# Patient Record
Sex: Male | Born: 1940
Health system: Southern US, Community
[De-identification: ages and names within clinical notes are randomized; demographics above are authoritative.]

## PROBLEM LIST (undated history)

## (undated) DIAGNOSIS — R7303 Prediabetes: Secondary | ICD-10-CM

## (undated) DIAGNOSIS — R001 Bradycardia, unspecified: Secondary | ICD-10-CM

## (undated) DIAGNOSIS — K219 Gastro-esophageal reflux disease without esophagitis: Secondary | ICD-10-CM

## (undated) DIAGNOSIS — I639 Cerebral infarction, unspecified: Secondary | ICD-10-CM

## (undated) DIAGNOSIS — M1710 Unilateral primary osteoarthritis, unspecified knee: Secondary | ICD-10-CM

## (undated) DIAGNOSIS — C439 Malignant melanoma of skin, unspecified: Secondary | ICD-10-CM

## (undated) DIAGNOSIS — Z8551 Personal history of malignant neoplasm of bladder: Secondary | ICD-10-CM

## (undated) DIAGNOSIS — I219 Acute myocardial infarction, unspecified: Secondary | ICD-10-CM

## (undated) DIAGNOSIS — Z950 Presence of cardiac pacemaker: Secondary | ICD-10-CM

## (undated) DIAGNOSIS — G44009 Cluster headache syndrome, unspecified, not intractable: Secondary | ICD-10-CM

## (undated) DIAGNOSIS — K2270 Barrett's esophagus without dysplasia: Secondary | ICD-10-CM

## (undated) DIAGNOSIS — M109 Gout, unspecified: Secondary | ICD-10-CM

## (undated) DIAGNOSIS — I10 Essential (primary) hypertension: Secondary | ICD-10-CM

## (undated) DIAGNOSIS — I2510 Atherosclerotic heart disease of native coronary artery without angina pectoris: Secondary | ICD-10-CM

## (undated) DIAGNOSIS — S2239XA Fracture of one rib, unspecified side, initial encounter for closed fracture: Secondary | ICD-10-CM

## (undated) DIAGNOSIS — G4730 Sleep apnea, unspecified: Secondary | ICD-10-CM

## (undated) DIAGNOSIS — L409 Psoriasis, unspecified: Secondary | ICD-10-CM

## (undated) DIAGNOSIS — E785 Hyperlipidemia, unspecified: Secondary | ICD-10-CM

## (undated) DIAGNOSIS — S02109A Fracture of base of skull, unspecified side, initial encounter for closed fracture: Secondary | ICD-10-CM

## (undated) DIAGNOSIS — I872 Venous insufficiency (chronic) (peripheral): Secondary | ICD-10-CM

## (undated) DIAGNOSIS — R609 Edema, unspecified: Secondary | ICD-10-CM

## (undated) DIAGNOSIS — I714 Abdominal aortic aneurysm, without rupture: Secondary | ICD-10-CM

## (undated) DIAGNOSIS — I509 Heart failure, unspecified: Secondary | ICD-10-CM

## (undated) DIAGNOSIS — Z95810 Presence of automatic (implantable) cardiac defibrillator: Secondary | ICD-10-CM

## (undated) DIAGNOSIS — C679 Malignant neoplasm of bladder, unspecified: Secondary | ICD-10-CM

## (undated) DIAGNOSIS — M179 Osteoarthritis of knee, unspecified: Secondary | ICD-10-CM

## (undated) DIAGNOSIS — I499 Cardiac arrhythmia, unspecified: Secondary | ICD-10-CM

## (undated) DIAGNOSIS — E119 Type 2 diabetes mellitus without complications: Secondary | ICD-10-CM

## (undated) DIAGNOSIS — J189 Pneumonia, unspecified organism: Secondary | ICD-10-CM

## (undated) DIAGNOSIS — M5136 Other intervertebral disc degeneration, lumbar region: Secondary | ICD-10-CM

## (undated) DIAGNOSIS — M51369 Other intervertebral disc degeneration, lumbar region without mention of lumbar back pain or lower extremity pain: Secondary | ICD-10-CM

## (undated) DIAGNOSIS — R0600 Dyspnea, unspecified: Secondary | ICD-10-CM

## (undated) HISTORY — DX: Barrett's esophagus without dysplasia: K22.70

## (undated) HISTORY — DX: Personal history of malignant neoplasm of bladder: Z85.51

## (undated) HISTORY — DX: Fracture of one rib, unspecified side, initial encounter for closed fracture: S22.39XA

## (undated) HISTORY — DX: Other intervertebral disc degeneration, lumbar region without mention of lumbar back pain or lower extremity pain: M51.369

## (undated) HISTORY — DX: Fracture of base of skull, unspecified side, initial encounter for closed fracture: S02.109A

## (undated) HISTORY — DX: Psoriasis, unspecified: L40.9

## (undated) HISTORY — DX: Cerebral infarction, unspecified: I63.9

## (undated) HISTORY — DX: Cardiac arrhythmia, unspecified: I49.9

## (undated) HISTORY — DX: Heart failure, unspecified: I50.9

## (undated) HISTORY — DX: Cluster headache syndrome, unspecified, not intractable: G44.009

## (undated) HISTORY — DX: Other intervertebral disc degeneration, lumbar region: M51.36

## (undated) HISTORY — DX: Unilateral primary osteoarthritis, unspecified knee: M17.10

## (undated) HISTORY — DX: Venous insufficiency (chronic) (peripheral): I87.2

## (undated) HISTORY — DX: Gout, unspecified: M10.9

## (undated) HISTORY — DX: Malignant melanoma of skin, unspecified: C43.9

## (undated) HISTORY — DX: Bradycardia, unspecified: R00.1

## (undated) HISTORY — DX: Osteoarthritis of knee, unspecified: M17.9

## (undated) HISTORY — DX: Type 2 diabetes mellitus without complications: E11.9

## (undated) HISTORY — PX: ELBOW BURSA SURGERY: SHX615

## (undated) HISTORY — DX: Atherosclerotic heart disease of native coronary artery without angina pectoris: I25.10

## (undated) HISTORY — DX: Presence of cardiac pacemaker: Z95.0

## (undated) HISTORY — PX: CORONARY ANGIOPLASTY: SHX604

## (undated) HISTORY — DX: Abdominal aortic aneurysm, without rupture: I71.4

## (undated) HISTORY — PX: INSERT / REPLACE / REMOVE PACEMAKER: SUR710

## (undated) HISTORY — DX: Hyperlipidemia, unspecified: E78.5

## (undated) HISTORY — DX: Prediabetes: R73.03

## (undated) HISTORY — DX: Sleep apnea, unspecified: G47.30

---

## 1992-11-25 HISTORY — PX: ANGIOPLASTY: SHX39

## 1995-11-26 DIAGNOSIS — S2239XA Fracture of one rib, unspecified side, initial encounter for closed fracture: Secondary | ICD-10-CM

## 1995-11-26 DIAGNOSIS — S02109A Fracture of base of skull, unspecified side, initial encounter for closed fracture: Secondary | ICD-10-CM

## 1995-11-26 HISTORY — DX: Fracture of base of skull, unspecified side, initial encounter for closed fracture: S02.109A

## 1995-11-26 HISTORY — DX: Fracture of one rib, unspecified side, initial encounter for closed fracture: S22.39XA

## 1995-12-27 DIAGNOSIS — Z8551 Personal history of malignant neoplasm of bladder: Secondary | ICD-10-CM

## 1995-12-27 HISTORY — PX: BLADDER TUMOR EXCISION: SHX238

## 1995-12-27 HISTORY — DX: Personal history of malignant neoplasm of bladder: Z85.51

## 2004-09-20 ENCOUNTER — Emergency Department: Payer: Self-pay | Admitting: Unknown Physician Specialty

## 2004-09-29 ENCOUNTER — Emergency Department: Payer: Self-pay | Admitting: Emergency Medicine

## 2004-11-05 ENCOUNTER — Ambulatory Visit: Payer: Self-pay | Admitting: Family Medicine

## 2005-10-01 ENCOUNTER — Ambulatory Visit: Payer: Self-pay | Admitting: Family Medicine

## 2005-10-02 ENCOUNTER — Ambulatory Visit: Payer: Self-pay | Admitting: Family Medicine

## 2006-09-22 HISTORY — PX: CORONARY ARTERY BYPASS GRAFT: SHX141

## 2006-10-10 DIAGNOSIS — Z950 Presence of cardiac pacemaker: Secondary | ICD-10-CM

## 2006-10-10 HISTORY — DX: Presence of cardiac pacemaker: Z95.0

## 2007-03-10 ENCOUNTER — Ambulatory Visit: Payer: Self-pay | Admitting: Family Medicine

## 2007-05-04 ENCOUNTER — Inpatient Hospital Stay: Payer: Self-pay | Admitting: *Deleted

## 2007-05-04 ENCOUNTER — Other Ambulatory Visit: Payer: Self-pay

## 2007-06-03 DIAGNOSIS — I714 Abdominal aortic aneurysm, without rupture, unspecified: Secondary | ICD-10-CM

## 2007-06-03 HISTORY — DX: Abdominal aortic aneurysm, without rupture: I71.4

## 2007-06-03 HISTORY — DX: Abdominal aortic aneurysm, without rupture, unspecified: I71.40

## 2007-06-03 HISTORY — PX: ABDOMINAL AORTIC ANEURYSM REPAIR: SHX42

## 2007-06-05 ENCOUNTER — Emergency Department: Payer: Self-pay | Admitting: Emergency Medicine

## 2009-02-21 ENCOUNTER — Ambulatory Visit: Payer: Self-pay | Admitting: Family Medicine

## 2010-02-07 ENCOUNTER — Ambulatory Visit: Payer: Self-pay | Admitting: Family Medicine

## 2010-02-25 ENCOUNTER — Inpatient Hospital Stay: Payer: Self-pay | Admitting: Specialist

## 2010-03-06 ENCOUNTER — Other Ambulatory Visit: Payer: Self-pay | Admitting: Sports Medicine

## 2010-03-22 ENCOUNTER — Encounter: Payer: Self-pay | Admitting: Internal Medicine

## 2010-03-25 ENCOUNTER — Encounter: Payer: Self-pay | Admitting: Internal Medicine

## 2010-04-25 ENCOUNTER — Encounter: Payer: Self-pay | Admitting: Internal Medicine

## 2010-06-23 ENCOUNTER — Ambulatory Visit: Payer: Self-pay | Admitting: Family Medicine

## 2011-04-02 ENCOUNTER — Ambulatory Visit: Payer: Self-pay | Admitting: Family Medicine

## 2011-06-19 ENCOUNTER — Ambulatory Visit: Payer: Self-pay | Admitting: Family Medicine

## 2011-07-08 ENCOUNTER — Ambulatory Visit: Payer: Self-pay | Admitting: Family Medicine

## 2011-08-15 ENCOUNTER — Other Ambulatory Visit: Payer: Self-pay | Admitting: Rheumatology

## 2011-08-17 DIAGNOSIS — I499 Cardiac arrhythmia, unspecified: Secondary | ICD-10-CM | POA: Insufficient documentation

## 2011-08-17 DIAGNOSIS — Z8679 Personal history of other diseases of the circulatory system: Secondary | ICD-10-CM | POA: Insufficient documentation

## 2011-08-17 DIAGNOSIS — I10 Essential (primary) hypertension: Secondary | ICD-10-CM | POA: Insufficient documentation

## 2011-11-11 ENCOUNTER — Other Ambulatory Visit: Payer: Self-pay | Admitting: Rheumatology

## 2011-11-20 ENCOUNTER — Ambulatory Visit: Payer: Self-pay | Admitting: Specialist

## 2011-11-22 ENCOUNTER — Ambulatory Visit: Payer: Self-pay | Admitting: Specialist

## 2012-01-03 ENCOUNTER — Ambulatory Visit: Payer: Self-pay | Admitting: Family Medicine

## 2012-12-26 DIAGNOSIS — C439 Malignant melanoma of skin, unspecified: Secondary | ICD-10-CM

## 2012-12-26 HISTORY — DX: Malignant melanoma of skin, unspecified: C43.9

## 2012-12-26 HISTORY — PX: MELANOMA EXCISION: SHX5266

## 2013-06-24 DIAGNOSIS — Z8673 Personal history of transient ischemic attack (TIA), and cerebral infarction without residual deficits: Secondary | ICD-10-CM | POA: Insufficient documentation

## 2013-06-24 DIAGNOSIS — I255 Ischemic cardiomyopathy: Secondary | ICD-10-CM | POA: Insufficient documentation

## 2013-06-24 DIAGNOSIS — N189 Chronic kidney disease, unspecified: Secondary | ICD-10-CM | POA: Insufficient documentation

## 2013-06-24 DIAGNOSIS — Z8679 Personal history of other diseases of the circulatory system: Secondary | ICD-10-CM | POA: Insufficient documentation

## 2013-06-24 DIAGNOSIS — Z9889 Other specified postprocedural states: Secondary | ICD-10-CM | POA: Insufficient documentation

## 2013-12-20 ENCOUNTER — Ambulatory Visit: Payer: Self-pay | Admitting: Family Medicine

## 2014-05-10 LAB — LIPID PANEL
CHOLESTEROL: 118 mg/dL (ref 0–200)
HDL: 28 mg/dL — AB (ref 35–70)
LDL CALC: 67 mg/dL
TRIGLYCERIDES: 115 mg/dL (ref 40–160)

## 2014-05-10 LAB — HEPATIC FUNCTION PANEL
ALT: 12 U/L (ref 10–40)
AST: 18 U/L (ref 14–40)

## 2014-06-29 ENCOUNTER — Ambulatory Visit: Payer: Self-pay | Admitting: Gastroenterology

## 2014-06-29 LAB — HM COLONOSCOPY

## 2014-06-30 LAB — PATHOLOGY REPORT

## 2014-08-17 LAB — BASIC METABOLIC PANEL
BUN: 23 mg/dL — AB (ref 4–21)
Creatinine: 1.4 mg/dL — AB (ref 0.6–1.3)
Glucose: 102 mg/dL
Potassium: 4.5 mmol/L (ref 3.4–5.3)
SODIUM: 142 mmol/L (ref 137–147)

## 2014-08-17 LAB — HEMOGLOBIN A1C: Hgb A1c MFr Bld: 6.1 % — AB (ref 4.0–6.0)

## 2015-02-27 DIAGNOSIS — Z79899 Other long term (current) drug therapy: Secondary | ICD-10-CM | POA: Diagnosis not present

## 2015-02-27 DIAGNOSIS — L408 Other psoriasis: Secondary | ICD-10-CM | POA: Diagnosis not present

## 2015-03-13 DIAGNOSIS — K645 Perianal venous thrombosis: Secondary | ICD-10-CM | POA: Diagnosis not present

## 2015-03-30 DIAGNOSIS — L409 Psoriasis, unspecified: Secondary | ICD-10-CM | POA: Insufficient documentation

## 2015-03-30 DIAGNOSIS — M1710 Unilateral primary osteoarthritis, unspecified knee: Secondary | ICD-10-CM | POA: Insufficient documentation

## 2015-03-30 DIAGNOSIS — M545 Low back pain, unspecified: Secondary | ICD-10-CM | POA: Insufficient documentation

## 2015-03-30 DIAGNOSIS — M179 Osteoarthritis of knee, unspecified: Secondary | ICD-10-CM | POA: Insufficient documentation

## 2015-03-30 DIAGNOSIS — N183 Chronic kidney disease, stage 3 unspecified: Secondary | ICD-10-CM | POA: Insufficient documentation

## 2015-03-30 DIAGNOSIS — G44009 Cluster headache syndrome, unspecified, not intractable: Secondary | ICD-10-CM | POA: Insufficient documentation

## 2015-03-30 DIAGNOSIS — G4733 Obstructive sleep apnea (adult) (pediatric): Secondary | ICD-10-CM | POA: Insufficient documentation

## 2015-03-30 DIAGNOSIS — M791 Myalgia, unspecified site: Secondary | ICD-10-CM | POA: Insufficient documentation

## 2015-03-30 DIAGNOSIS — R001 Bradycardia, unspecified: Secondary | ICD-10-CM | POA: Insufficient documentation

## 2015-03-30 DIAGNOSIS — R7303 Prediabetes: Secondary | ICD-10-CM | POA: Insufficient documentation

## 2015-03-30 DIAGNOSIS — K2270 Barrett's esophagus without dysplasia: Secondary | ICD-10-CM | POA: Insufficient documentation

## 2015-03-30 DIAGNOSIS — I872 Venous insufficiency (chronic) (peripheral): Secondary | ICD-10-CM | POA: Insufficient documentation

## 2015-03-30 DIAGNOSIS — Z85820 Personal history of malignant melanoma of skin: Secondary | ICD-10-CM | POA: Insufficient documentation

## 2015-03-30 DIAGNOSIS — M5137 Other intervertebral disc degeneration, lumbosacral region: Secondary | ICD-10-CM | POA: Insufficient documentation

## 2015-03-30 DIAGNOSIS — E7800 Pure hypercholesterolemia, unspecified: Secondary | ICD-10-CM | POA: Insufficient documentation

## 2015-03-30 DIAGNOSIS — L4050 Arthropathic psoriasis, unspecified: Secondary | ICD-10-CM | POA: Insufficient documentation

## 2015-03-30 DIAGNOSIS — M79609 Pain in unspecified limb: Secondary | ICD-10-CM | POA: Insufficient documentation

## 2015-03-30 DIAGNOSIS — K645 Perianal venous thrombosis: Secondary | ICD-10-CM | POA: Insufficient documentation

## 2015-03-30 DIAGNOSIS — M2550 Pain in unspecified joint: Secondary | ICD-10-CM | POA: Insufficient documentation

## 2015-03-30 DIAGNOSIS — Z8551 Personal history of malignant neoplasm of bladder: Secondary | ICD-10-CM | POA: Insufficient documentation

## 2015-03-30 DIAGNOSIS — Z95810 Presence of automatic (implantable) cardiac defibrillator: Secondary | ICD-10-CM | POA: Insufficient documentation

## 2015-03-30 DIAGNOSIS — M109 Gout, unspecified: Secondary | ICD-10-CM | POA: Insufficient documentation

## 2015-03-30 DIAGNOSIS — M25569 Pain in unspecified knee: Secondary | ICD-10-CM | POA: Insufficient documentation

## 2015-03-30 DIAGNOSIS — N184 Chronic kidney disease, stage 4 (severe): Secondary | ICD-10-CM | POA: Insufficient documentation

## 2015-03-30 DIAGNOSIS — I2510 Atherosclerotic heart disease of native coronary artery without angina pectoris: Secondary | ICD-10-CM | POA: Insufficient documentation

## 2015-04-03 DIAGNOSIS — L409 Psoriasis, unspecified: Secondary | ICD-10-CM | POA: Diagnosis not present

## 2015-05-12 ENCOUNTER — Encounter: Payer: Self-pay | Admitting: Family Medicine

## 2015-05-12 ENCOUNTER — Ambulatory Visit (INDEPENDENT_AMBULATORY_CARE_PROVIDER_SITE_OTHER): Payer: Medicare Other | Admitting: Family Medicine

## 2015-05-12 VITALS — BP 102/54 | HR 60 | Temp 97.8°F | Resp 16 | Ht 70.0 in | Wt 210.0 lb

## 2015-05-12 DIAGNOSIS — E7800 Pure hypercholesterolemia, unspecified: Secondary | ICD-10-CM

## 2015-05-12 DIAGNOSIS — N183 Chronic kidney disease, stage 3 unspecified: Secondary | ICD-10-CM

## 2015-05-12 DIAGNOSIS — Z0000 Encounter for general adult medical examination without abnormal findings: Secondary | ICD-10-CM | POA: Diagnosis not present

## 2015-05-12 DIAGNOSIS — J984 Other disorders of lung: Secondary | ICD-10-CM | POA: Insufficient documentation

## 2015-05-12 DIAGNOSIS — Z125 Encounter for screening for malignant neoplasm of prostate: Secondary | ICD-10-CM | POA: Diagnosis not present

## 2015-05-12 DIAGNOSIS — I5020 Unspecified systolic (congestive) heart failure: Secondary | ICD-10-CM

## 2015-05-12 DIAGNOSIS — I2510 Atherosclerotic heart disease of native coronary artery without angina pectoris: Secondary | ICD-10-CM

## 2015-05-12 DIAGNOSIS — M1000 Idiopathic gout, unspecified site: Secondary | ICD-10-CM

## 2015-05-12 DIAGNOSIS — L4050 Arthropathic psoriasis, unspecified: Secondary | ICD-10-CM

## 2015-05-12 DIAGNOSIS — Z23 Encounter for immunization: Secondary | ICD-10-CM | POA: Diagnosis not present

## 2015-05-12 DIAGNOSIS — M5137 Other intervertebral disc degeneration, lumbosacral region: Secondary | ICD-10-CM

## 2015-05-12 DIAGNOSIS — R7309 Other abnormal glucose: Secondary | ICD-10-CM

## 2015-05-12 DIAGNOSIS — R7303 Prediabetes: Secondary | ICD-10-CM

## 2015-05-12 NOTE — Progress Notes (Signed)
Patient: Taylor [name], Male    DOB: February 13, 1941, 74 y.o.   MRN: [id number] Visit Date: 05/12/2015  Today's Provider: Lelon Huh, MD   Chief Complaint  Patient presents with  . Medicare Wellness  . Hemorrhoids  . Chronic Kidney Disease   Subjective:    Physical  Taylor [name] is a 74 y.o. male who presents today for his Physical.  He feels fairly well. He reports exercising yes. He reports he is sleeping well.  -----------------------------------------------------------  Coronary artery disease, follow up   He reports good compliance with treatment. He is not having side effects.  He is not having to take nitroglycerine. He is experiencing none. He is not experiencing chest heaviness, chest tightness or shortness of breath. He is able to carry groceries,     is able to climb stairs,      is able to cut grass,      is able to work in the yard without having above symptoms.   Lipid Panel     Component Value Date/Time   CHOL 118 05/10/2014   TRIG 115 05/10/2014   HDL 28* 05/10/2014   LDLCALC 67 05/10/2014    ------------------------------------------------------------------------    GERD, Follow up:  He continues to take pantoprazole everyday which is working well. Has no reflux sx so long as he takes it every day. Is having no adverse effects.  ------------------------------------------------------------------------    Hypertension, follow-up:  BP Readings from Last 3 Encounters:  05/12/15 102/54  03/13/15 98/62    Weight trend: stable Wt Readings from Last 3 Encounters:  05/12/15 210 lb (95.255 kg)  03/13/15 218 lb (98.884 kg)    ------------------------------------------------------------------------    Lipid/Cholesterol, Follow-up:   Is doing well with atorvastatin with no adverse effects.  . Last Lipid Panel:    Component Value Date/Time   CHOL 118 05/10/2014   TRIG 115 05/10/2014   HDL 28* 05/10/2014   LDLCALC 67 05/10/2014     Wt  Readings from Last 3 Encounters:  05/12/15 210 lb (95.255 kg)  03/13/15 218 lb (98.884 kg)    -------------------------------------------------------------------    Review of Systems  Constitutional: Positive for activity change.  HENT: Positive for hearing loss.   Eyes: Positive for photophobia.  Respiratory: Positive for shortness of breath.   Cardiovascular: Negative for chest pain, palpitations and leg swelling.  Neurological: Negative for dizziness, light-headedness and headaches.    History   Social History  . Marital Status: Married    Spouse Name: N/A  . Number of Children: 3  . Years of Education: N/A   Occupational History  . retired     previously worked as a Hospital doctor   Social History Main Topics  . Smoking status: Former Smoker -- 1.00 packs/day    Types: Cigarettes    Quit date: 11/26/1999  . Smokeless tobacco: Not on file  . Alcohol Use: No  . Drug Use: No  . Sexual Activity: Not on file   Other Topics Concern  . Not on file   Social History Narrative    Patient Active Problem List   Diagnosis Date Noted  . Psoriatic arthritis 05/12/2015  . Barrett esophagus 03/30/2015  . Ache in joint 03/30/2015  . Bradycardia 03/30/2015  . Arteriosclerosis of coronary artery 03/30/2015  . Cardiac defibrillator in place 03/30/2015  . Chronic kidney disease (CKD), stage III (moderate) 03/30/2015  . Cluster headache syndrome 03/30/2015  . Degeneration of lumbar or lumbosacral intervertebral disc 03/30/2015  . Gout 03/30/2015  .  External hemorrhoid, thrombosed 03/30/2015  . Personal history of malignant neoplasm of bladder 03/30/2015  . Hypercholesteremia 03/30/2015  . LBP (low back pain) 03/30/2015  . Malignant melanoma 03/30/2015  . Muscle ache 03/30/2015  . Arthritis of knee, degenerative 03/30/2015  . Prediabetes 03/30/2015  . Psoriasis 03/30/2015  . Obstructive sleep apnea 03/30/2015  . Chronic venous insufficiency 03/30/2015  . AAA (abdominal  aortic aneurysm) 06/24/2013  . Chronic kidney disease 06/24/2013  . Cerebral infarct 06/24/2013  . Cardiomyopathy, ischemic 06/24/2013  . Essential (primary) hypertension 08/17/2011  . Arrhythmia, sinus node 08/17/2011  . Ventricular fibrillation 08/17/2011  . Congestive heart failure 02/26/2010  . 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 Cardiac pacemaker in situ 10/10/2006    Past Surgical History  Procedure Laterality Date  . Abdominal aortic aneurysm repair  06/03/2007    [facility]; Dr. Kellie Simmering  . Coronary artery bypass graft  09/22/2006    four  . Bladder tumor excision  12/1995  . Angioplasty  1994    MI  . Melanoma excision  12/2012    Right forearm    His family history includes Arthritis in his brother; Cancer in his father and mother; Heart attack (age of onset: 22) in his father.    Previous Medications   ALLOPURINOL (ZYLOPRIM) 300 MG TABLET    Take 1 tablet by mouth daily.   ASPIRIN 81 MG EC TABLET    Take 1 tablet by mouth daily.   ATORVASTATIN (LIPITOR) 40 MG TABLET    Take 1 tablet by mouth daily.   CARVEDILOL (COREG) 12.5 MG TABLET    Take 1 tablet by mouth daily.   DICLOFENAC (VOLTAREN) 50 MG EC TABLET    Take 50 mg by mouth 2 (two) times daily.   FUROSEMIDE (LASIX) 40 MG TABLET    Take 1-2 tablets by mouth daily.   HYDROCORTISONE-PRAMOXINE (ANALPRAM-HC) 2.5-1 % RECTAL CREAM    Place 1 application rectally. 3 times a day to hemorrhoids   INDOMETHACIN (INDOCIN) 50 MG CAPSULE    Take 1 capsule by mouth 3 (three) times daily as needed.   LOSARTAN (COZAAR) 25 MG TABLET    Take 1 tablet by mouth daily.   PANTOPRAZOLE (PROTONIX) 40 MG TABLET    Take 1 tablet by mouth daily.   SPIRONOLACTONE (ALDACTONE) 25 MG TABLET    Take 1 tablet by mouth daily.   TRIAMCINOLONE OINTMENT (KENALOG) 0.1 %    Apply 1 application topically 2 (two) times daily.    Patient Care Team: Birdie Sons, MD as PCP - General (Family  Medicine)     Objective:   Vitals: BP 102/54 mmHg  Pulse 60  Temp(Src) 97.8 F (36.6 C) (Oral)  Resp 16  Ht 5\' 10"  (1.778 m)  Wt 210 lb (95.255 kg)  BMI 30.13 kg/m2  SpO2 96%  Physical Exam  General Appearance:    Alert, cooperative, no distress, appears stated age  Head:    Normocephalic, without obvious abnormality, atraumatic  Eyes:    PERRL, conjunctiva/corneas clear, EOM's intact, fundi    benign, both eyes       Ears:    Normal TM's and external ear canals, both ears  Nose:   Nares normal, septum midline, mucosa normal, no drainage   or sinus tenderness  Throat:   Lips, mucosa, and tongue normal; teeth and gums normal  Neck:   Supple, symmetrical, trachea midline, no adenopathy;       thyroid:  No enlargement/tenderness/nodules; no carotid   bruit or JVD  Back:     Symmetric, no curvature, ROM normal, no CVA tenderness  Lungs:     Clear to auscultation bilaterally, respirations unlabored  Chest wall:    No tenderness or deformity  Heart:    Regular rate and rhythm, S1 and S2 normal, no murmur, rub   or gallop  Abdomen:     Soft, non-tender, bowel sounds active all four quadrants,    no masses, no organomegaly  Genitalia:    deferred  Rectal:    deferred  Extremities:   Extremities normal, atraumatic, no cyanosis or edema  Pulses:   2+ and symmetric all extremities  Skin:   Extensive psoriatic lesions on extensor surfaces.   Lymph nodes:   Cervical, supraclavicular, and axillary nodes normal  Neurologic:   CNII-XII intact. Normal strength, sensation and reflexes      throughout    Activities of Daily Living No flowsheet data found.  Fall Risk Assessment Fall Risk  05/12/2015  Falls in the past year? No  Risk for fall due to : History of fall(s)     Depression Screen PHQ 2/9 Scores 05/12/2015  PHQ - 2 Score 0    Cognitive Testing - 6-CIT  Correct? Score   What year is it? yes 0 0 or 4  What month is it? yes 0 0 or 3  Memorize:    Taylor [name],  [birth year],   [address]  [place],      What time is it? (within 1 hour) yes 0 0 or 3  Count backwards from 20 yes 0 0, 2, or 4  Name the months of the year yes 0 0, 2, or 4  Repeat name & address above yes 5 0, 2, 4, 6, 8, or 10       TOTAL SCORE  5/28   Interpretation:  Normal  Normal (0-7) Abnormal (8-28)       Assessment & Plan:     Annual Wellness Visit  Reviewed patient's Family Medical History Reviewed and updated list of patient's medical providers Assessment of cognitive impairment was done Assessed patient's functional ability Established a written schedule for health screening [place] Completed and Reviewed  Exercise Activities and Dietary recommendations Goals    None      Immunization History  Administered Date(s) Administered  . Pneumococcal Conjugate-13 05/09/2014    Health Maintenance  Topic Date Due  . TETANUS/TDAP  10/06/1961  . ZOSTAVAX  10/06/2002  . PNA vac Low Risk Adult (2 of 2 - PPSV23) 05/10/2015  . INFLUENZA VACCINE  06/26/2015  . COLONOSCOPY  06/29/2024       Discussed health benefits of physical activity, and encouraged him to engage in regular exercise appropriate for his age and condition.   Follow up hemorrhoids from 03/13/2015. Advised by Taylor [name] to start hydrocortisone Ace-Pramoxide 2.5-1 %. Advised to start bathtub soaks. Follow up from 08/16/2014 Chronic Kidney diease stage lll (moderate). No changes made. ------------------------------------------------------------------------------------------------------------  .1. Systolic congestive heart failure, unspecified congestive heart failure chronicity  - CBC  2. Chronic kidney disease (CKD), stage III (moderate)  - Uric acid - Comprehensive metabolic panel - Vit D  25 hydroxy (rtn osteoporosis monitoring)  3. Degeneration of lumbar or lumbosacral intervertebral disc   4. Idiopathic gout, unspecified chronicity, unspecified site   5. Hypercholesteremia  - TSH -  Lipid panel  6. Borderline diabetes  - Hemoglobin A1c  7. Prostate cancer screening  -  PSA  8. Need for prophylactic vaccination with Streptococcus pneumoniae (Pneumococcus) and Influenza vaccines  - Pneumococcal polysaccharide vaccine 23-valent greater than or equal to 2yo subcutaneous/IM  9. Psoriatic arthritis   10. Arteriosclerosis of coronary artery   11. Restrictive lung disease

## 2015-05-16 LAB — COMPREHENSIVE METABOLIC PANEL
A/G RATIO: 1.3 (ref 1.1–2.5)
ALT: 11 IU/L (ref 0–44)
AST: 22 IU/L (ref 0–40)
Albumin: 4 g/dL (ref 3.5–4.8)
Alkaline Phosphatase: 116 IU/L (ref 39–117)
BILIRUBIN TOTAL: 0.5 mg/dL (ref 0.0–1.2)
BUN/Creatinine Ratio: 17 (ref 10–22)
BUN: 22 mg/dL (ref 8–27)
CO2: 25 mmol/L (ref 18–29)
CREATININE: 1.31 mg/dL — AB (ref 0.76–1.27)
Calcium: 9 mg/dL (ref 8.6–10.2)
Chloride: 102 mmol/L (ref 97–108)
GFR, EST AFRICAN AMERICAN: 62 mL/min/{1.73_m2} (ref >59)
GFR, EST NON AFRICAN AMERICAN: 54 mL/min/{1.73_m2} — AB (ref >59)
GLUCOSE: 94 mg/dL (ref 65–99)
Globulin, Total: 3.1 g/dL (ref 1.5–4.5)
Potassium: 5 mmol/L (ref 3.5–5.2)
Sodium: 141 mmol/L (ref 134–144)
TOTAL PROTEIN: 7.1 g/dL (ref 6.0–8.5)

## 2015-05-16 LAB — LIPID PANEL
CHOL/HDL RATIO: 5 ratio (ref 0.0–5.0)
Cholesterol, Total: 139 mg/dL (ref 100–199)
HDL: 28 mg/dL — AB (ref >39)
LDL CALC: 83 mg/dL (ref 0–99)
Triglycerides: 142 mg/dL (ref 0–149)
VLDL CHOLESTEROL CAL: 28 mg/dL (ref 5–40)

## 2015-05-16 LAB — CBC
HEMATOCRIT: 45.2 % (ref 37.5–51.0)
Hemoglobin: 14.9 g/dL (ref 12.6–17.7)
MCH: 31.4 pg (ref 26.6–33.0)
MCHC: 33 g/dL (ref 31.5–35.7)
MCV: 95 fL (ref 79–97)
Platelets: 190 10*3/uL (ref 150–379)
RBC: 4.75 x10E6/uL (ref 4.14–5.80)
RDW: 17 % — ABNORMAL HIGH (ref 12.3–15.4)
WBC: 6 10*3/uL (ref 3.4–10.8)

## 2015-05-16 LAB — TSH: TSH: 3.43 u[IU]/mL (ref 0.450–4.500)

## 2015-05-16 LAB — PSA: Prostate Specific Ag, Serum: 1.4 ng/mL (ref 0.0–4.0)

## 2015-05-16 LAB — HEMOGLOBIN A1C
Est. average glucose Bld gHb Est-mCnc: 123 mg/dL
Hgb A1c MFr Bld: 5.9 % — ABNORMAL HIGH (ref 4.8–5.6)

## 2015-05-16 LAB — VITAMIN D 25 HYDROXY (VIT D DEFICIENCY, FRACTURES): Vit D, 25-Hydroxy: 47.4 ng/mL (ref 30.0–100.0)

## 2015-05-16 LAB — URIC ACID: Uric Acid: 6.6 mg/dL (ref 3.7–8.6)

## 2015-06-14 ENCOUNTER — Other Ambulatory Visit: Payer: Self-pay | Admitting: Family Medicine

## 2015-06-27 ENCOUNTER — Other Ambulatory Visit: Payer: Self-pay | Admitting: Family Medicine

## 2015-06-27 ENCOUNTER — Other Ambulatory Visit: Payer: Self-pay | Admitting: *Deleted

## 2015-07-07 ENCOUNTER — Ambulatory Visit
Admission: RE | Admit: 2015-07-07 | Discharge: 2015-07-07 | Disposition: A | Discharge status: Home / Self Care | Payer: Medicare Other | Source: Ambulatory Visit | Attending: Family Medicine | Admitting: Family Medicine

## 2015-07-07 ENCOUNTER — Ambulatory Visit (INDEPENDENT_AMBULATORY_CARE_PROVIDER_SITE_OTHER): Payer: Medicare Other | Admitting: Family Medicine

## 2015-07-07 ENCOUNTER — Encounter: Payer: Self-pay | Admitting: Family Medicine

## 2015-07-07 VITALS — BP 130/80 | HR 61 | Temp 97.7°F | Resp 19 | Wt 217.0 lb

## 2015-07-07 DIAGNOSIS — R059 Cough, unspecified: Secondary | ICD-10-CM

## 2015-07-07 DIAGNOSIS — R091 Pleurisy: Secondary | ICD-10-CM | POA: Diagnosis not present

## 2015-07-07 DIAGNOSIS — R05 Cough: Secondary | ICD-10-CM | POA: Diagnosis not present

## 2015-07-07 MED ORDER — HYDROCOD POLST-CPM POLST ER 10-8 MG/5ML PO SUER: 5.0000 mL | Freq: Two times a day (BID) | ORAL | Status: DC

## 2015-07-07 MED ORDER — AZITHROMYCIN 250 MG PO TABS: ORAL_TABLET | ORAL | Status: DC

## 2015-07-07 NOTE — Progress Notes (Signed)
Patient: Taylor [name] Male    DOB: 06-Jan-1941   74 y.o.   MRN: [id number] Visit Date: 07/07/2015  Today's Provider: Vernie Murders, PA   Chief Complaint  Patient presents with  . Cough   Subjective:    Cough This is a new problem. The current episode started in the past 7 days. The problem has been gradually worsening. The problem occurs constantly. The cough is non-productive. Associated symptoms include chest pain (from the cough) and headaches. Pertinent negatives include no chills, ear congestion, ear pain, fever, nasal congestion, postnasal drip, rhinorrhea or sore throat. Associated symptoms comments: Is all in the chest. Nothing (Just pressure on his back) aggravates the symptoms. He has tried OTC cough suppressant for the symptoms. The treatment provided no relief.   Past Medical History  Diagnosis Date  . History of bladder cancer 12/1995  . CAD (coronary artery disease)   . Stroke   . Malignant melanoma 12/2012    right dorsal forearm excised  . Barrett's esophagus   . Venous incompetence   . Hyperlipidemia   . Psoriasis   . Gout   . CHF (congestive heart failure)   . Cluster headache   . Pre-diabetes   . Bradycardia   . DDD (degenerative disc disease), lumbar   . Osteoarthritis of knee   . Sleep apnea   . Pacemaker 10/10/2006  . AAA (abdominal aortic aneurysm) 06/03/2007    [facility] Dr. Kellie Simmering  . Fracture of skull base 1997    due to fall  . Rib fracture 1997    due to fall   Past Surgical History  Procedure Laterality Date  . Abdominal aortic aneurysm repair  06/03/2007    [facility]; Dr. Kellie Simmering  . Coronary artery bypass graft  09/22/2006    four  . Bladder tumor excision  12/1995  . Angioplasty  1994    MI  . Melanoma excision  12/2012    Right forearm   Family History  Problem Relation Age of Onset  . Cancer Mother     Melanoma skin cancer  . Heart attack Father 67  . Cancer Father    throat cancer  . Arthritis Brother      Allergies  Allergen Reactions  . Amlodipine Besylate Swelling  . Crestor  [Rosuvastatin]     Other reaction(s): Muscle Cramps and pain  . Rosuvastatin Calcium     Other reaction(s): Muscle Cramps and pain  . Rocephin  [Ceftriaxone]    Previous Medications   ALLOPURINOL (ZYLOPRIM) 300 MG TABLET    Take 1 tablet by mouth daily.   ASPIRIN 81 MG EC TABLET    Take 1 tablet by mouth daily.   ATORVASTATIN (LIPITOR) 40 MG TABLET    Take 1 tablet by mouth daily.   CARVEDILOL (COREG) 12.5 MG TABLET    Take 1 tablet by mouth daily.   CYANOCOBALAMIN (VITAMIN B-12 CR) 1000 MCG TBCR    Take by mouth.   DICLOFENAC (VOLTAREN) 50 MG EC TABLET    TAKE 1 TABLET BY MOUTH TWICE DAILY AS NEEDED   FOLIC ACID (FOLVITE) 1 MG TABLET       FUROSEMIDE (LASIX) 40 MG TABLET    Take 1-2 tablets by mouth daily.   GLUCOSAMINE SULFATE (GLUCOSAMINE RELIEF) 1000 MG TABS    Take by mouth.   INDOMETHACIN (INDOCIN) 50 MG CAPSULE    Take 1 capsule by mouth 3 (three) times daily as  needed.   LOSARTAN (COZAAR) 50 MG TABLET    TK 1 T PO QD   METHOTREXATE (RHEUMATREX) 2.5 MG TABLET       PANTOPRAZOLE (PROTONIX) 40 MG TABLET    TAKE 1 TABLET BY MOUTH EVERY DAY   TRIAMCINOLONE OINTMENT (KENALOG) 0.1 %    Apply 1 application topically 2 (two) times daily.    Review of Systems  Constitutional: Positive for fatigue. Negative for fever and chills.  HENT: Positive for sinus pressure. Negative for ear pain, postnasal drip, rhinorrhea and sore throat.   Eyes: Negative.        Watery eyes  Respiratory: Positive for cough.   Cardiovascular: Positive for chest pain (from the cough).  Gastrointestinal: Negative.   Endocrine: Negative.   Genitourinary: Negative.   Musculoskeletal: Positive for back pain (too much cough).  Skin: Negative.   Allergic/Immunologic: Negative.   Neurological: Positive for dizziness and headaches.  Hematological: Negative.   Psychiatric/Behavioral: Negative.       Social History  Substance Use Topics  . Smoking status: Former Smoker -- 1.00 packs/day    Types: Cigarettes    Quit date: 11/26/1999  . Smokeless tobacco: Not on file  . Alcohol Use: No   Objective:   BP 130/80 mmHg  Pulse 61  Temp(Src) 97.7 F (36.5 C) (Oral)  Resp 19  Wt 217 lb (98.431 kg)  SpO2 97%  Physical Exam  Constitutional: He is oriented to person, place, and time. He appears well-developed and well-nourished. He appears distressed.  Sharp pain in chest with cough or deep breath  HENT:  Head: Normocephalic and atraumatic.  Right Ear: External ear normal.  Left Ear: External ear normal.  Nose: Nose normal.  Mouth/Throat: Oropharynx is clear and moist.  Eyes: Conjunctivae and EOM are normal. Pupils are equal, round, and reactive to light.  Neck: Normal range of motion. Neck supple.  Cardiovascular: Normal rate and regular rhythm.   Pulmonary/Chest: He is in respiratory distress.  Coarse breath sounds with sharp pain when taking a deep breath or coughing  Abdominal: Soft. Bowel sounds are normal.  Neurological: He is alert and oriented to person, place, and time.  Psychiatric: He has a normal mood and affect. His behavior is normal. Thought content normal.      Assessment & Plan:     1. Cough Onset of non-productive cough over the past week. Non-productive with sharp pains in chest with cough. No fever. No relief with use of Mucinex-DM. Pressure on mid back region causes more coughing. No significant dyspnea. - chlorpheniramine-HYDROcodone (TUSSIONEX PENNKINETIC ER) 10-8 MG/5ML SUER; Take 5 mLs by mouth 2 (two) times daily.  Dispense: 140 mL; Refill: 0  2. Pleurisy Onset with heavy prolonged coughing spells. States this feels similar to past pneumonia episodes. Will start Z-pak as we get CBC and CXR. No fever today. Pulse oximetry 97% today. Recheck pending lab and x-ray reports. - azithromycin (ZITHROMAX) 250 MG tablet; Two tablets by mouth today then one  daily for 4 days  Dispense: 6 tablet; Refill: 0 - CBC with Differential/Platelet - DG Chest 2 View       Vernie Murders, [place]  [facility]

## 2015-07-08 LAB — CBC WITH DIFFERENTIAL/PLATELET
Basophils Absolute: 0 x10E3/uL (ref 0.0–0.2)
Basos: 0 %
EOS (ABSOLUTE): 0.2 x10E3/uL (ref 0.0–0.4)
Eos: 3 %
Hematocrit: 44.2 % (ref 37.5–51.0)
Hemoglobin: 14.7 g/dL (ref 12.6–17.7)
Immature Grans (Abs): 0 x10E3/uL (ref 0.0–0.1)
Immature Granulocytes: 0 %
Lymphocytes Absolute: 1.5 x10E3/uL (ref 0.7–3.1)
Lymphs: 21 %
MCH: 31.9 pg (ref 26.6–33.0)
MCHC: 33.3 g/dL (ref 31.5–35.7)
MCV: 96 fL (ref 79–97)
Monocytes Absolute: 0.6 x10E3/uL (ref 0.1–0.9)
Monocytes: 9 %
Neutrophils Absolute: 4.9 x10E3/uL (ref 1.4–7.0)
Neutrophils: 67 %
Platelets: 158 x10E3/uL (ref 150–379)
RBC: 4.61 x10E6/uL (ref 4.14–5.80)
RDW: 17.9 % — ABNORMAL HIGH (ref 12.3–15.4)
WBC: 7.3 x10E3/uL (ref 3.4–10.8)

## 2015-07-10 ENCOUNTER — Telehealth: Payer: Self-pay

## 2015-07-10 ENCOUNTER — Encounter: Payer: Self-pay | Admitting: Family Medicine

## 2015-07-10 ENCOUNTER — Ambulatory Visit (INDEPENDENT_AMBULATORY_CARE_PROVIDER_SITE_OTHER): Payer: Medicare Other | Admitting: Family Medicine

## 2015-07-10 VITALS — BP 124/74 | HR 54 | Temp 97.7°F | Resp 16 | Wt 217.4 lb

## 2015-07-10 DIAGNOSIS — R053 Chronic cough: Secondary | ICD-10-CM

## 2015-07-10 DIAGNOSIS — R079 Chest pain, unspecified: Secondary | ICD-10-CM | POA: Diagnosis not present

## 2015-07-10 DIAGNOSIS — J984 Other disorders of lung: Secondary | ICD-10-CM | POA: Diagnosis not present

## 2015-07-10 DIAGNOSIS — R05 Cough: Secondary | ICD-10-CM

## 2015-07-10 MED ORDER — PREDNISONE 5 MG PO TABS: 5.0000 mg | ORAL_TABLET | Freq: Every day | ORAL | Status: DC

## 2015-07-10 NOTE — Progress Notes (Signed)
Patient ID: Taylor [name], male   DOB: 1941-06-09, 74 y.o.   MRN: [id number]       Patient: Taylor [name] Male    DOB: [birth year]   74 y.o.   MRN: [id number] Visit Date: 07/10/2015  Today's Provider: Vernie Murders, PA   Chief Complaint  Patient presents with  . Cough    seen by Simona Huh last Friday. cough is not getting any better. " I did passed out yesterday" pt stated   Subjective:    Cough This is a new problem. The current episode started in the past 7 days. The problem has been unchanged. Episode frequency: 4 times an hour" pt stated. The cough is non-productive (one time very little mucus clear color" pt stated). Associated symptoms include chest pain, shortness of breath and wheezing. Pertinent negatives include no chills, ear congestion, ear pain, fever, headaches, heartburn, nasal congestion, sore throat or sweats. Associated symptoms comments: And back pain. Pt stated that he is only in pain when he starts coughing.. Exacerbated by: pressure on my back makes it worst" pt stated. Treatments tried: Zpack and Tussinex cough syrup, and halls cough drops" The treatment provided no relief.  Passed out yesterday morning with severe coughing spell while sitting in a recliner while wife was at church. Was alone and unsure as to how long he was unconscious. Cough more with pressure on back.   Allergies  Allergen Reactions  . Amlodipine Besylate Swelling  . Crestor  [Rosuvastatin]     Other reaction(s): Muscle Cramps and pain  . Rosuvastatin Calcium     Other reaction(s): Muscle Cramps and pain  . Rocephin  [Ceftriaxone]    Previous Medications   ALLOPURINOL (ZYLOPRIM) 300 MG TABLET    Take 1 tablet by mouth daily.   ASPIRIN 81 MG EC TABLET    Take 1 tablet by mouth daily.   ATORVASTATIN (LIPITOR) 40 MG TABLET    Take 1 tablet by mouth daily.   AZITHROMYCIN (ZITHROMAX) 250 MG TABLET    Two tablets by mouth today then one daily for 4 days   CARVEDILOL (COREG) 12.5 MG TABLET    Take 1  tablet by mouth daily.   CHLORPHENIRAMINE-HYDROCODONE (TUSSIONEX PENNKINETIC ER) 10-8 MG/5ML SUER    Take 5 mLs by mouth 2 (two) times daily.   CYANOCOBALAMIN (VITAMIN B-12 CR) 1000 MCG TBCR    Take by mouth.   DICLOFENAC (VOLTAREN) 50 MG EC TABLET    TAKE 1 TABLET BY MOUTH TWICE DAILY AS NEEDED   FOLIC ACID (FOLVITE) 1 MG TABLET       FUROSEMIDE (LASIX) 40 MG TABLET    Take 1-2 tablets by mouth daily.   GLUCOSAMINE SULFATE (GLUCOSAMINE RELIEF) 1000 MG TABS    Take by mouth.   INDOMETHACIN (INDOCIN) 50 MG CAPSULE    Take 1 capsule by mouth 3 (three) times daily as needed.   LOSARTAN (COZAAR) 50 MG TABLET    TK 1 T PO QD   METHOTREXATE (RHEUMATREX) 2.5 MG TABLET       PANTOPRAZOLE (PROTONIX) 40 MG TABLET    TAKE 1 TABLET BY MOUTH EVERY DAY   TRIAMCINOLONE OINTMENT (KENALOG) 0.1 %    Apply 1 application topically 2 (two) times daily.    Review of Systems  Constitutional: Negative for fever and chills.  HENT: Negative for ear pain and sore throat.   Respiratory: Positive for cough, shortness of breath and wheezing.   Cardiovascular: Positive for chest pain.  Gastrointestinal: Negative for heartburn.  Neurological: Negative for headaches.    Social History  Substance Use Topics  . Smoking status: Former Smoker -- 1.00 packs/day    Types: Cigarettes    Quit date: 11/26/1999  . Smokeless tobacco: Not on file  . Alcohol Use: No   Objective:   BP 124/74 mmHg  Pulse 54  Temp(Src) 97.7 F (36.5 C) (Oral)  Resp 16  Wt 217 lb 6.4 oz (98.612 kg)   Physical Exam  Constitutional: He appears well-developed and well-nourished.  HENT:  Head: Normocephalic and atraumatic.  Eyes: EOM are normal. Pupils are equal, round, and reactive to light.  Cardiovascular: Normal rate and regular rhythm.   Pulmonary/Chest: He is in respiratory distress. He has rales.  Abdominal: Soft. Bowel sounds are normal.  Some soreness in upper abdomen with cough. BS wnl.      Assessment & Plan:     1.  Persistent cough Still having some ticklish cough and passes out once yesterday with a sustained hard coughing spell. Been better controlled with Tussionex BID. Will recheck CBC and BNP for flare of CHF. CXR on 07-07-15 was negative for acute changes. Will finish the Z-pak today or tomorrow. Follow up pending lab reports. - B Nat Peptide - CBC with Differential/Platelet  2. Chest pain, unspecified chest pain type Still having sharp back pain with coughing or deep breath. History of cardiomyopathy and CHF. Denies palpitations and no firing of his pacemaker/defibrillator in the left upper chest. No rashes or tenderness to palpate chest or back. Will check BNP and D-Dimer. Recheck pending reports. - B Nat Peptide - D-Dimer, Quantitative  3. Restrictive lung disease Pulse oximetry 92% today with slight wheeze with few fine rales. Will add prednisone taper and finish Z-pak. May need to go to ER if wheezing or dyspnea occurs.       Vernie Murders, PA  [facility]

## 2015-07-10 NOTE — Telephone Encounter (Signed)
Advised pt as directed below, pt verbalized fully understanding.  Pt stated that he "passed out" yesterday after coughing; scheduled an appointment at 12:00 pm, oked per Simona Huh.  Thanks,

## 2015-07-10 NOTE — Telephone Encounter (Signed)
-----   Message from Margo Common, [place] sent at 07/09/2015  2:40 PM EDT ----- Essentially normal blood cell counts. No sign of infection. Finish present medication and recheck as needed.

## 2015-07-11 ENCOUNTER — Telehealth: Payer: Self-pay

## 2015-07-11 LAB — CBC WITH DIFFERENTIAL/PLATELET
Basophils Absolute: 0 10*3/uL (ref 0.0–0.2)
Basos: 1 %
EOS (ABSOLUTE): 0.2 10*3/uL (ref 0.0–0.4)
Eos: 2 %
HEMOGLOBIN: 14.6 g/dL (ref 12.6–17.7)
Hematocrit: 43.4 % (ref 37.5–51.0)
Immature Grans (Abs): 0 10*3/uL (ref 0.0–0.1)
Immature Granulocytes: 0 %
LYMPHS ABS: 1.9 10*3/uL (ref 0.7–3.1)
Lymphs: 22 %
MCH: 31.9 pg (ref 26.6–33.0)
MCHC: 33.6 g/dL (ref 31.5–35.7)
MCV: 95 fL (ref 79–97)
MONOCYTES: 11 %
MONOS ABS: 0.9 10*3/uL (ref 0.1–0.9)
NEUTROS ABS: 5.4 10*3/uL (ref 1.4–7.0)
Neutrophils: 64 %
Platelets: 168 10*3/uL (ref 150–379)
RBC: 4.57 x10E6/uL (ref 4.14–5.80)
RDW: 18.3 % — AB (ref 12.3–15.4)
WBC: 8.5 10*3/uL (ref 3.4–10.8)

## 2015-07-11 LAB — D-DIMER, QUANTITATIVE (NOT AT ARMC): D-DIMER: 2.26 mg/L FEU — ABNORMAL HIGH (ref 0.00–0.49)

## 2015-07-11 LAB — BRAIN NATRIURETIC PEPTIDE: BNP: 184.6 pg/mL — ABNORMAL HIGH (ref 0.0–100.0)

## 2015-07-11 NOTE — Telephone Encounter (Signed)
-----   Message from Margo Common, [place] sent at 07/11/2015  2:10 PM EDT ----- Blood tests indicate possibility of pulmonary embolus. Discussed this with Dr. [name] Section and he agrees a CTA scan for a clot in his lungs needs to be done. Will put order in chart.

## 2015-07-11 NOTE — Addendum Note (Signed)
Addended by: Vernie Murders E on: 07/11/2015 03:25 PM   Modules accepted: Orders

## 2015-07-11 NOTE — Telephone Encounter (Signed)
-----   Message from Margo Common, [place] sent at 07/07/2015  6:13 PM EDT ----- No sign of pneumonia on chest x-ray. Proceed with medications and recheck in 5 days if no better. Sooner if any fever develops.

## 2015-07-11 NOTE — Telephone Encounter (Signed)
Patient advised as directed below.  Thanks,  -Amita Atayde 

## 2015-07-11 NOTE — Telephone Encounter (Signed)
Patient advised as below.  Thanks,  -Joseline

## 2015-07-12 MED ORDER — DICLOFENAC SODIUM 50 MG PO TBEC: 50.0000 mg | DELAYED_RELEASE_TABLET | Freq: Two times a day (BID) | ORAL | Status: DC

## 2015-07-12 NOTE — Telephone Encounter (Signed)
Per [facility] the test ordered is not correct if you are trying to rule out PE.Please add order for [id number].Please mark as stat if you feel pt needs to go for test today

## 2015-07-13 ENCOUNTER — Ambulatory Visit
Admission: RE | Admit: 2015-07-13 | Discharge: 2015-07-13 | Disposition: A | Discharge status: Home / Self Care | Payer: Medicare Other | Source: Ambulatory Visit | Attending: Family Medicine | Admitting: Family Medicine

## 2015-07-13 DIAGNOSIS — I517 Cardiomegaly: Secondary | ICD-10-CM | POA: Insufficient documentation

## 2015-07-13 DIAGNOSIS — R918 Other nonspecific abnormal finding of lung field: Secondary | ICD-10-CM | POA: Insufficient documentation

## 2015-07-13 DIAGNOSIS — R05 Cough: Secondary | ICD-10-CM | POA: Diagnosis present

## 2015-07-13 DIAGNOSIS — R079 Chest pain, unspecified: Secondary | ICD-10-CM | POA: Diagnosis present

## 2015-07-13 HISTORY — DX: Essential (primary) hypertension: I10

## 2015-07-13 MED ORDER — IOHEXOL 350 MG/ML SOLN
80.0000 mL | Freq: Once | INTRAVENOUS | Status: AC | PRN
  Administered 2015-07-13: 80 mL via INTRAVENOUS

## 2015-07-14 ENCOUNTER — Other Ambulatory Visit: Payer: Self-pay | Admitting: Family Medicine

## 2015-07-14 ENCOUNTER — Telehealth: Payer: Self-pay | Admitting: Family Medicine

## 2015-07-14 DIAGNOSIS — J181 Lobar pneumonia, unspecified organism: Principal | ICD-10-CM

## 2015-07-14 DIAGNOSIS — J189 Pneumonia, unspecified organism: Secondary | ICD-10-CM

## 2015-07-14 MED ORDER — LEVOFLOXACIN 500 MG PO TABS: 500.0000 mg | ORAL_TABLET | Freq: Every day | ORAL | Status: DC

## 2015-07-14 NOTE — Telephone Encounter (Signed)
Pt called inquiring about the CT scan he had yesterday.  His call back is [phone].  Thanks, [employer]

## 2015-07-14 NOTE — Telephone Encounter (Signed)
-----   Message from Margo Common, [place] sent at 07/14/2015  4:42 PM EDT ----- CT scan did not show any clots/pulmonary emboli in lungs. Heart size a little larger than in the past. There was a small area of pneumonia in the left lower lung posteriorly with some pleural nodules. Have you had a history of asbestos exposure? If having any persistence of the cough and discomfort, should get Levofloxacin 500 mg qd #7. Also, should let heart doctor know his heart sized seems to be a little bigger. Recheck lungs in a 7-10 days.

## 2015-07-14 NOTE — Telephone Encounter (Signed)
Patient advised as directed below. Patient verbalized understanding and agrees with treatment plan. Patient states he is still coughing and would prefer starting the antibiotic. RX sent to [facility]. Patient scheduled for a follow up appointment.

## 2015-07-24 ENCOUNTER — Encounter: Payer: Self-pay | Admitting: Family Medicine

## 2015-07-24 ENCOUNTER — Ambulatory Visit (INDEPENDENT_AMBULATORY_CARE_PROVIDER_SITE_OTHER): Payer: Medicare Other | Admitting: Family Medicine

## 2015-07-24 VITALS — BP 138/70 | HR 63 | Temp 98.6°F | Resp 16 | Wt 221.0 lb

## 2015-07-24 DIAGNOSIS — J189 Pneumonia, unspecified organism: Secondary | ICD-10-CM

## 2015-07-24 NOTE — Progress Notes (Addendum)
Patient: Taylor [name] Male    DOB: 08-20-1941   74 y.o.   MRN: [id number] Visit Date: 07/24/2015  Today's Provider: Lelon Huh, MD   Chief Complaint  Patient presents with  . Follow-up  . Cough   Subjective:    HPI Patient is here today for follow up of Pneumonia. Pneumonia was found on CT scan that was done 07/13/2015. Patient was prescribed Levaquin 500mg  daily for 7 days and advised to follow up in the office in 7-10 days for a recheck of the lungs. Today patient comes in stating he feels better. Patient has completed all doses of medication and tolerated it well. Patient denies any cough, shortness of breath or fever.    Allergies  Allergen Reactions  . Amlodipine Besylate Swelling  . Crestor  [Rosuvastatin]     Other reaction(s): Muscle Cramps and pain  . Rosuvastatin Calcium     Other reaction(s): Muscle Cramps and pain  . Rocephin  [Ceftriaxone]    Previous Medications   ALLOPURINOL (ZYLOPRIM) 300 MG TABLET    Take 1 tablet by mouth daily.   ASPIRIN 81 MG EC TABLET    Take 1 tablet by mouth daily.   ATORVASTATIN (LIPITOR) 40 MG TABLET    Take 1 tablet by mouth daily.   CARVEDILOL (COREG) 12.5 MG TABLET    Take 1 tablet by mouth daily.   CHLORPHENIRAMINE-HYDROCODONE (TUSSIONEX PENNKINETIC ER) 10-8 MG/5ML SUER    Take 5 mLs by mouth 2 (two) times daily.   CYANOCOBALAMIN (VITAMIN B-12 CR) 1000 MCG TBCR    Take by mouth.   DICLOFENAC (VOLTAREN) 50 MG EC TABLET    Take 1 tablet (50 mg total) by mouth 2 (two) times daily. As needed for pain   DICLOFENAC (VOLTAREN) 50 MG EC TABLET    TAKE 1 TABLET BY MOUTH TWICE DAILY AS NEEDED   FOLIC ACID (FOLVITE) 1 MG TABLET       FUROSEMIDE (LASIX) 40 MG TABLET    Take 1-2 tablets by mouth daily.   GLUCOSAMINE SULFATE (GLUCOSAMINE RELIEF) 1000 MG TABS    Take by mouth.   INDOMETHACIN (INDOCIN) 50 MG CAPSULE    Take 1 capsule by mouth 3 (three) times daily as needed.   LOSARTAN (COZAAR) 50 MG TABLET    TK 1 T PO QD   METHOTREXATE (RHEUMATREX) 2.5 MG TABLET       PANTOPRAZOLE (PROTONIX) 40 MG TABLET    TAKE 1 TABLET BY MOUTH EVERY DAY   TRIAMCINOLONE OINTMENT (KENALOG) 0.1 %    Apply 1 application topically 2 (two) times daily.    Review of Systems  Constitutional: Negative for fever, chills, diaphoresis, activity change, appetite change, fatigue and unexpected weight change.  HENT: Negative for congestion, dental problem, drooling, ear discharge, ear pain, facial swelling, hearing loss, mouth sores, nosebleeds, postnasal drip, rhinorrhea, sinus pressure, sneezing, sore throat, tinnitus, trouble swallowing and voice change.   Respiratory: Negative for cough, choking, chest tightness, shortness of breath, wheezing and stridor.     Social History  Substance Use Topics  . Smoking status: Former Smoker -- 1.00 packs/day    Types: Cigarettes    Quit date: 11/26/1999  . Smokeless tobacco: Not on file  . Alcohol Use: No   Objective:   BP 138/70 mmHg  Pulse 63  Temp(Src) 98.6 F (37 C) (Oral)  Resp 16  Wt 221 lb (100.245 kg)  SpO2 95%  Physical Exam   General Appearance:  Alert, cooperative, no distress  Eyes:    PERRL, conjunctiva/corneas clear, EOM's intact       Lungs:     Clear to auscultation bilaterally, respirations unlabored  Heart:    Regular rate and rhythm  Neurologic:   Awake, alert, oriented x 3. No apparent focal neurological           defect.          Assessment & Plan:     1. Pneumonia, organism unspecified Clinically resolved. Call if any symptoms return.     Incidental finding of bilateral pleural nodularities. He denies and underling breathing difficulties or any significant asbestos exposure. No additional work up at this time, but consider additional radiologic surveillance or follow up lung function tests in the future.       Lelon Huh, MD  [facility]

## 2015-07-27 ENCOUNTER — Encounter: Payer: Self-pay | Admitting: Family Medicine

## 2015-07-27 DIAGNOSIS — R222 Localized swelling, mass and lump, trunk: Secondary | ICD-10-CM | POA: Insufficient documentation

## 2015-08-22 ENCOUNTER — Other Ambulatory Visit: Payer: Self-pay | Admitting: Family Medicine

## 2015-10-12 DIAGNOSIS — L409 Psoriasis, unspecified: Secondary | ICD-10-CM | POA: Diagnosis not present

## 2015-10-16 DIAGNOSIS — L409 Psoriasis, unspecified: Secondary | ICD-10-CM | POA: Diagnosis not present

## 2015-10-16 DIAGNOSIS — Z79899 Other long term (current) drug therapy: Secondary | ICD-10-CM | POA: Diagnosis not present

## 2015-11-07 ENCOUNTER — Other Ambulatory Visit: Payer: Self-pay | Admitting: Family Medicine

## 2015-12-19 ENCOUNTER — Telehealth: Payer: Self-pay

## 2015-12-19 NOTE — Telephone Encounter (Signed)
Monica, a Designer, jewellery from [employer] called saying that she was doing her yearly check up with the patient, and he reports that his BP has been elevated in the past few weeks. She reports that his BP today was 160/82. She also reports that patient has had a 20lb weight gain over the last 2 months with associated shortness of breath. She is recommending that patient be seen due to his PMH of congestive heart failure. Scheduled patient's appt tomorrow (12/20/15) to be evaluated by Dr. [name] Section.

## 2015-12-20 ENCOUNTER — Ambulatory Visit (INDEPENDENT_AMBULATORY_CARE_PROVIDER_SITE_OTHER): Payer: Medicare Other | Admitting: Family Medicine

## 2015-12-20 ENCOUNTER — Encounter: Payer: Self-pay | Admitting: Family Medicine

## 2015-12-20 VITALS — BP 140/70 | HR 61 | Temp 97.9°F | Resp 16 | Ht 70.0 in | Wt 229.0 lb

## 2015-12-20 DIAGNOSIS — M659 Synovitis and tenosynovitis, unspecified: Secondary | ICD-10-CM

## 2015-12-20 DIAGNOSIS — Z23 Encounter for immunization: Secondary | ICD-10-CM | POA: Diagnosis not present

## 2015-12-20 DIAGNOSIS — M65969 Unspecified synovitis and tenosynovitis, unspecified lower leg: Secondary | ICD-10-CM

## 2015-12-20 DIAGNOSIS — I10 Essential (primary) hypertension: Secondary | ICD-10-CM

## 2015-12-20 NOTE — Progress Notes (Signed)
Patient: Taylor [name] Male    DOB: [birth year]   75 y.o.   MRN: [id number] Visit Date: 12/20/2015  Today's Provider: Lelon Huh, MD   Chief Complaint  Patient presents with  . Blood Pressure Check   Subjective:    HPI  Patient checked his blood pressure Saturday 12/16/2015 and reading was 220/120. Check blood pressure 15 minutes later and bp was 200/115. Sunday his bp was 157/97. Symptoms included blurry vision. No chest pain, or palpitations. Has had sob for 2 weeks, only notices when he is sitting.   He states he has been having much more pain in his legs the last several days, primarily along anterolateral aspect of right lower leg.    Allergies  Allergen Reactions  . Amlodipine Besylate Swelling  . Crestor  [Rosuvastatin]     Other reaction(s): Muscle Cramps and pain  . Rosuvastatin Calcium     Other reaction(s): Muscle Cramps and pain  . Rocephin  [Ceftriaxone]    Previous Medications   ALLOPURINOL (ZYLOPRIM) 300 MG TABLET    TAKE 1 TABLET BY MOUTH EVERY DAY   ASPIRIN 81 MG EC TABLET    Take 1 tablet by mouth daily.   ATORVASTATIN (LIPITOR) 40 MG TABLET    TAKE ONE TABLET BY MOUTH EVERY DAY   CARVEDILOL (COREG) 12.5 MG TABLET    TAKE 1 TABLET BY MOUTH DAILY   CHLORPHENIRAMINE-HYDROCODONE (TUSSIONEX PENNKINETIC ER) 10-8 MG/5ML SUER    Take 5 mLs by mouth 2 (two) times daily.   CYANOCOBALAMIN (VITAMIN B-12 CR) 1000 MCG TBCR    Take by mouth.   DICLOFENAC (VOLTAREN) 50 MG EC TABLET    Take 1 tablet (50 mg total) by mouth 2 (two) times daily. As needed for pain   DICLOFENAC (VOLTAREN) 50 MG EC TABLET    TAKE 1 TABLET BY MOUTH TWICE DAILY AS NEEDED   FOLIC ACID (FOLVITE) 1 MG TABLET       FUROSEMIDE (LASIX) 40 MG TABLET    TAKE 1 TO 2 TABLETS BY MOUTH DAILY   GLUCOSAMINE SULFATE (GLUCOSAMINE RELIEF) 1000 MG TABS    Take by mouth.   INDOMETHACIN (INDOCIN) 50 MG CAPSULE    Take 1 capsule by mouth 3 (three) times daily as needed.   LOSARTAN (COZAAR) 50 MG TABLET    TK  1 T PO QD   METHOTREXATE (RHEUMATREX) 2.5 MG TABLET       PANTOPRAZOLE (PROTONIX) 40 MG TABLET    TAKE 1 TABLET BY MOUTH EVERY DAY   TRIAMCINOLONE OINTMENT (KENALOG) 0.1 %    Apply 1 application topically 2 (two) times daily.    Review of Systems  Constitutional: Negative for fever, chills, appetite change and fatigue.  Eyes: Positive for photophobia and visual disturbance.       Blurry vision on Sunday 12/17/2015.  Respiratory: Positive for shortness of breath and wheezing. Negative for chest tightness.   Cardiovascular: Negative for chest pain and palpitations.  Gastrointestinal: Negative for nausea, vomiting and abdominal pain.    Social History  Substance Use Topics  . Smoking status: Former Smoker -- 1.00 packs/day    Types: Cigarettes    Quit date: 11/26/1999  . Smokeless tobacco: Not on file  . Alcohol Use: No   Objective:   BP 120/80 mmHg  Pulse 61  Temp(Src) 97.9 F (36.6 C) (Oral)  Resp 16  Ht 5\' 10"  (1.778 m)  Wt 229 lb (103.874 kg)  BMI 32.86 kg/m2  SpO2  95%  Physical Exam   General Appearance:    Alert, cooperative, no distress  Eyes:    PERRL, conjunctiva/corneas clear, EOM's intact       Lungs:     Clear to auscultation bilaterally, respirations unlabored  Heart:    Regular rate and rhythm  Neurologic:   Awake, alert, oriented x 3. No apparent focal neurological           defect.   MS:   Tender along right anterior tibialis. No calf tenderness. No swelling. No erythema.        Assessment & Plan:     1. Need for prophylactic vaccination and inoculation against influenza  - Flu Vaccine QUAD 36+ mos IM  2. Essential (primary) hypertension BP normal today, was likely aggravate by pain in legs.   3. Tibialis anterior tenosynovitis Apply ice when pain flares up. He needs to discuss with rheumatologist as well.        Lelon Huh, MD  [facility]

## 2015-12-21 DIAGNOSIS — M659 Synovitis and tenosynovitis, unspecified: Secondary | ICD-10-CM | POA: Insufficient documentation

## 2015-12-29 ENCOUNTER — Other Ambulatory Visit: Payer: Self-pay | Admitting: Family Medicine

## 2016-01-03 ENCOUNTER — Telehealth: Payer: Self-pay | Admitting: Family Medicine

## 2016-01-03 MED ORDER — LOSARTAN POTASSIUM 50 MG PO TABS: 50.0000 mg | ORAL_TABLET | Freq: Every day | ORAL | Status: DC

## 2016-01-03 NOTE — Telephone Encounter (Signed)
Pt contacted office for refill request on the following medications: losartan (COZAAR) 50 MG tablet to [facility] in [place]. Pt stated that when he was in the office on 12/20/15 and was advised to increase dose to 2 tablets once a day. Pt is out of his medication and needs a new RX sent to show the dosage change. Thanks TNP

## 2016-01-03 NOTE — Telephone Encounter (Signed)
Please advise refill? 

## 2016-02-26 DIAGNOSIS — Z79899 Other long term (current) drug therapy: Secondary | ICD-10-CM | POA: Diagnosis not present

## 2016-02-28 DIAGNOSIS — L408 Other psoriasis: Secondary | ICD-10-CM | POA: Diagnosis not present

## 2016-02-28 DIAGNOSIS — Z79899 Other long term (current) drug therapy: Secondary | ICD-10-CM | POA: Diagnosis not present

## 2016-04-29 DIAGNOSIS — Z79899 Other long term (current) drug therapy: Secondary | ICD-10-CM | POA: Diagnosis not present

## 2016-05-01 DIAGNOSIS — L408 Other psoriasis: Secondary | ICD-10-CM | POA: Diagnosis not present

## 2016-06-01 ENCOUNTER — Other Ambulatory Visit: Payer: Self-pay | Admitting: Family Medicine

## 2016-06-24 ENCOUNTER — Other Ambulatory Visit: Payer: Self-pay | Admitting: Family Medicine

## 2016-07-30 ENCOUNTER — Other Ambulatory Visit: Payer: Self-pay | Admitting: Family Medicine

## 2016-08-03 ENCOUNTER — Other Ambulatory Visit: Payer: Self-pay | Admitting: Family Medicine

## 2016-09-28 ENCOUNTER — Other Ambulatory Visit: Payer: Self-pay | Admitting: Family Medicine

## 2016-10-03 DIAGNOSIS — H2512 Age-related nuclear cataract, left eye: Secondary | ICD-10-CM | POA: Diagnosis not present

## 2016-10-09 ENCOUNTER — Ambulatory Visit (INDEPENDENT_AMBULATORY_CARE_PROVIDER_SITE_OTHER): Payer: Medicare Other

## 2016-10-09 DIAGNOSIS — Z23 Encounter for immunization: Secondary | ICD-10-CM | POA: Diagnosis not present

## 2016-10-14 DIAGNOSIS — H2512 Age-related nuclear cataract, left eye: Secondary | ICD-10-CM | POA: Diagnosis not present

## 2016-10-15 ENCOUNTER — Encounter: Payer: Self-pay | Admitting: *Deleted

## 2016-10-22 ENCOUNTER — Ambulatory Visit
Admission: RE | Admit: 2016-10-22 | Discharge: 2016-10-22 | Disposition: A | Discharge status: Home / Self Care | Payer: Medicare Other | Source: Ambulatory Visit | Attending: Ophthalmology | Admitting: Ophthalmology

## 2016-10-22 ENCOUNTER — Encounter
Admission: RE | Disposition: A | Discharge status: Home / Self Care | Payer: Self-pay | Source: Ambulatory Visit | Attending: Ophthalmology

## 2016-10-22 ENCOUNTER — Ambulatory Visit: Payer: Medicare Other | Admitting: Anesthesiology

## 2016-10-22 DIAGNOSIS — I509 Heart failure, unspecified: Secondary | ICD-10-CM | POA: Insufficient documentation

## 2016-10-22 DIAGNOSIS — I25810 Atherosclerosis of coronary artery bypass graft(s) without angina pectoris: Secondary | ICD-10-CM | POA: Diagnosis not present

## 2016-10-22 DIAGNOSIS — Z79899 Other long term (current) drug therapy: Secondary | ICD-10-CM | POA: Diagnosis not present

## 2016-10-22 DIAGNOSIS — I110 Hypertensive heart disease with heart failure: Secondary | ICD-10-CM | POA: Insufficient documentation

## 2016-10-22 DIAGNOSIS — Z955 Presence of coronary angioplasty implant and graft: Secondary | ICD-10-CM | POA: Insufficient documentation

## 2016-10-22 DIAGNOSIS — G4730 Sleep apnea, unspecified: Secondary | ICD-10-CM | POA: Diagnosis not present

## 2016-10-22 DIAGNOSIS — I739 Peripheral vascular disease, unspecified: Secondary | ICD-10-CM | POA: Diagnosis not present

## 2016-10-22 DIAGNOSIS — G709 Myoneural disorder, unspecified: Secondary | ICD-10-CM | POA: Insufficient documentation

## 2016-10-22 DIAGNOSIS — Z87891 Personal history of nicotine dependence: Secondary | ICD-10-CM | POA: Insufficient documentation

## 2016-10-22 DIAGNOSIS — I2510 Atherosclerotic heart disease of native coronary artery without angina pectoris: Secondary | ICD-10-CM | POA: Diagnosis not present

## 2016-10-22 DIAGNOSIS — J449 Chronic obstructive pulmonary disease, unspecified: Secondary | ICD-10-CM | POA: Insufficient documentation

## 2016-10-22 DIAGNOSIS — I252 Old myocardial infarction: Secondary | ICD-10-CM | POA: Diagnosis not present

## 2016-10-22 DIAGNOSIS — H2512 Age-related nuclear cataract, left eye: Secondary | ICD-10-CM | POA: Diagnosis not present

## 2016-10-22 HISTORY — DX: Dyspnea, unspecified: R06.00

## 2016-10-22 HISTORY — DX: Presence of automatic (implantable) cardiac defibrillator: Z95.810

## 2016-10-22 HISTORY — PX: CATARACT EXTRACTION W/PHACO: SHX586

## 2016-10-22 HISTORY — DX: Acute myocardial infarction, unspecified: I21.9

## 2016-10-22 HISTORY — DX: Edema, unspecified: R60.9

## 2016-10-22 SURGERY — PHACOEMULSIFICATION, CATARACT, WITH IOL INSERTION
Anesthesia: Monitor Anesthesia Care | Site: Eye | Laterality: Left | Wound class: Clean

## 2016-10-22 MED ORDER — SODIUM CHLORIDE 0.9 % IV SOLN
INTRAVENOUS | Status: DC
  Administered 2016-10-22: 08:00:00 via INTRAVENOUS

## 2016-10-22 MED ORDER — MOXIFLOXACIN HCL 0.5 % OP SOLN
1.0000 [drp] | OPHTHALMIC | Status: DC
  Administered 2016-10-22 (×3): 1 [drp] via OPHTHALMIC

## 2016-10-22 MED ORDER — CARBACHOL 0.01 % IO SOLN
INTRAOCULAR | Status: DC | PRN
  Administered 2016-10-22: 0.5 mL via INTRAOCULAR

## 2016-10-22 MED ORDER — POVIDONE-IODINE 5 % OP SOLN
OPHTHALMIC | Status: AC
  Filled 2016-10-22: qty 30

## 2016-10-22 MED ORDER — ARMC OPHTHALMIC DILATING DROPS
1.0000 "application " | OPHTHALMIC | Status: AC
  Administered 2016-10-22 (×3): 1 via OPHTHALMIC

## 2016-10-22 MED ORDER — EPINEPHRINE PF 1 MG/ML IJ SOLN
INTRAMUSCULAR | Status: AC
  Filled 2016-10-22: qty 2

## 2016-10-22 MED ORDER — MOXIFLOXACIN HCL 0.5 % OP SOLN
OPHTHALMIC | Status: DC | PRN
  Administered 2016-10-22: 1 [drp] via OPHTHALMIC

## 2016-10-22 MED ORDER — NA CHONDROIT SULF-NA HYALURON 40-17 MG/ML IO SOLN
INTRAOCULAR | Status: AC
  Filled 2016-10-22: qty 1

## 2016-10-22 MED ORDER — ARMC OPHTHALMIC DILATING DROPS
OPHTHALMIC | Status: AC
  Administered 2016-10-22: 1 via OPHTHALMIC
  Filled 2016-10-22: qty 0.4

## 2016-10-22 MED ORDER — FENTANYL CITRATE (PF) 100 MCG/2ML IJ SOLN
INTRAMUSCULAR | Status: DC | PRN
  Administered 2016-10-22: 50 ug via INTRAVENOUS

## 2016-10-22 MED ORDER — NA CHONDROIT SULF-NA HYALURON 40-17 MG/ML IO SOLN
INTRAOCULAR | Status: DC | PRN
  Administered 2016-10-22: 1 mL via INTRAOCULAR

## 2016-10-22 MED ORDER — MOXIFLOXACIN HCL 0.5 % OP SOLN
OPHTHALMIC | Status: AC
  Filled 2016-10-22: qty 3

## 2016-10-22 MED ORDER — LIDOCAINE HCL (PF) 4 % IJ SOLN
INTRAMUSCULAR | Status: DC | PRN
  Administered 2016-10-22: 4 mL via OPHTHALMIC

## 2016-10-22 MED ORDER — EPINEPHRINE PF 1 MG/ML IJ SOLN
INTRAOCULAR | Status: DC | PRN
  Administered 2016-10-22: 1 mL via OPHTHALMIC

## 2016-10-22 MED ORDER — LIDOCAINE HCL (PF) 4 % IJ SOLN
INTRAMUSCULAR | Status: AC
Start: 2016-10-22 — End: 2016-10-22
  Filled 2016-10-22: qty 5

## 2016-10-22 MED ORDER — MIDAZOLAM HCL 2 MG/2ML IJ SOLN
INTRAMUSCULAR | Status: DC | PRN
  Administered 2016-10-22: 1 mg via INTRAVENOUS

## 2016-10-22 MED ORDER — POVIDONE-IODINE 5 % OP SOLN
OPHTHALMIC | Status: DC | PRN
  Administered 2016-10-22: 1 via OPHTHALMIC

## 2016-10-22 SURGICAL SUPPLY — 21 items
CANNULA ANT/CHMB 27GA (MISCELLANEOUS) ×3 IMPLANT
CUP MEDICINE 2OZ PLAST GRAD ST (MISCELLANEOUS) ×3 IMPLANT
GLOVE BIO SURGEON STRL SZ8 (GLOVE) ×3 IMPLANT
GLOVE BIOGEL M 6.5 STRL (GLOVE) ×3 IMPLANT
GLOVE SURG LX 8.0 MICRO (GLOVE) ×2
GLOVE SURG LX STRL 8.0 MICRO (GLOVE) ×1 IMPLANT
GOWN STRL REUS W/ TWL LRG LVL3 (GOWN DISPOSABLE) ×2 IMPLANT
GOWN STRL REUS W/TWL LRG LVL3 (GOWN DISPOSABLE) ×4
LENS IOL TECNIS ITEC 20.0 (Intraocular Lens) ×3 IMPLANT
PACK CATARACT (MISCELLANEOUS) ×3 IMPLANT
PACK CATARACT BRASINGTON LX (MISCELLANEOUS) ×3 IMPLANT
PACK EYE AFTER SURG (MISCELLANEOUS) ×3 IMPLANT
SOL BSS BAG (MISCELLANEOUS) ×3
SOL PREP PVP 2OZ (MISCELLANEOUS) ×3
SOLUTION BSS BAG (MISCELLANEOUS) ×1 IMPLANT
SOLUTION PREP PVP 2OZ (MISCELLANEOUS) ×1 IMPLANT
SYR 3ML LL SCALE MARK (SYRINGE) ×3 IMPLANT
SYR 5ML LL (SYRINGE) ×3 IMPLANT
SYR TB 1ML 27GX1/2 LL (SYRINGE) ×3 IMPLANT
WATER STERILE IRR 250ML POUR (IV SOLUTION) ×3 IMPLANT
WIPE NON LINTING 3.25X3.25 (MISCELLANEOUS) ×3 IMPLANT

## 2016-10-22 NOTE — Transfer of Care (Signed)
Immediate Anesthesia Transfer of Care Note  Patient: Taylor [name]  Procedure(s) Performed: Procedure(s) with comments: CATARACT EXTRACTION PHACO AND INTRAOCULAR LENS PLACEMENT (IOC) (Left) - [place] 47.7 AP% 18.4 CDE 8.78 Fluid pack lot # [id number]  Patient Location: PACU  Anesthesia Type:MAC  Level of Consciousness: awake  Airway & Oxygen Therapy: Patient Spontanous Breathing and Patient connected to nasal cannula oxygen  Post-op Assessment: Report given to RN and Post -op Vital signs reviewed and stable  Post vital signs: Reviewed and stable  Last Vitals:  Vitals:   10/22/16 0733  BP: (!) 141/67  Pulse: 63  Resp: 18  Temp: (!) 35.9 C    Last Pain:  Vitals:   10/22/16 0733  TempSrc: Tympanic         Complications: No apparent anesthesia complications

## 2016-10-22 NOTE — Discharge Instructions (Signed)
Eye Surgery Discharge Instructions  Expect mild scratchy sensation or mild soreness. DO NOT RUB YOUR EYE!  The day of surgery:  Minimal physical activity, but bed rest is not required  No reading, computer work, or close hand work  No bending, lifting, or straining.  May watch TV  For 24 hours:  No driving, legal decisions, or alcoholic beverages  Safety precautions  Eat anything you prefer: It is better to start with liquids, then soup then solid foods.  _____ Eye patch should be worn until postoperative exam tomorrow.  ____ Solar shield eyeglasses should be worn for comfort in the sunlight/patch while sleeping  Resume all regular medications including aspirin or Coumadin if these were discontinued prior to surgery. You may shower, bathe, shave, or wash your hair. Tylenol may be taken for mild discomfort.  Call your doctor if you experience significant pain, nausea, or vomiting, fever > 101 or other signs of infection. [phone] or [phone] Specific instructions:  Follow-up Information    PORFILIO,WILLIAM LOUIS, MD Follow up.   Specialty:  Ophthalmology Why:  Tomorrow at 9:55 am. Contact information: [address] [place] [address] [phone]

## 2016-10-22 NOTE — Op Note (Signed)
PREOPERATIVE DIAGNOSIS:  Nuclear sclerotic cataract of the left eye.   POSTOPERATIVE DIAGNOSIS:  Nuclear sclerotic cataract of the left eye.   OPERATIVE PROCEDURE: Procedure(s): CATARACT EXTRACTION PHACO AND INTRAOCULAR LENS PLACEMENT (IOC)   SURGEON:  Birder Robson, MD.   ANESTHESIA:  Anesthesiologist: Martha Clan, MD CRNA: Allean Found, CRNA  1.      Managed anesthesia care. 2.     0.2ml of Shugarcaine was instilled following the paracentesis   COMPLICATIONS:  None.   TECHNIQUE:   Stop and chop   DESCRIPTION OF PROCEDURE:  The patient was examined and consented in the preoperative holding area where the aforementioned topical anesthesia was applied to the left eye and then brought back to the Operating Room where the left eye was prepped and draped in the usual sterile ophthalmic fashion and a lid speculum was placed. A paracentesis was created with the side port blade and the anterior chamber was filled with viscoelastic. A near clear corneal incision was performed with the steel keratome. A continuous curvilinear capsulorrhexis was performed with a cystotome followed by the capsulorrhexis forceps. Hydrodissection and hydrodelineation were carried out with BSS on a blunt cannula. The lens was removed in a stop and chop  technique and the remaining cortical material was removed with the irrigation-aspiration handpiece. The capsular bag was inflated with viscoelastic and the Technis [id number] lens was placed in the capsular bag without complication. The remaining viscoelastic was removed from the eye with the irrigation-aspiration handpiece. The wounds were hydrated. The anterior chamber was flushed with Miostat and the eye was inflated to physiologic pressure. 0.18ml Vigamox was placed in the anterior chamber. The wounds were found to be water tight. The eye was dressed with Vigamox. The patient was given protective glasses to wear throughout the day and a shield with which to sleep tonight.  The patient was also given drops with which to begin a drop regimen today and will follow-up with me in one day.  Implant Name Type Inv. Item Serial No. Manufacturer Lot No. LRB No. Used  LENS IOL DIOP 20.0 - [id number] Intraocular Lens LENS IOL DIOP 20.0 [id number] AMO   Left 1    Procedure(s) with comments: CATARACT EXTRACTION PHACO AND INTRAOCULAR LENS PLACEMENT (IOC) (Left) - [place] 47.7 AP% 18.4 CDE 8.78 Fluid pack lot # [id number]  Electronically signed: [place] 10/22/2016 9:14 AM

## 2016-10-22 NOTE — Anesthesia Preprocedure Evaluation (Signed)
Anesthesia Evaluation  Patient identified by MRN, date of birth, ID band Patient awake    Reviewed: Allergy & Precautions, H&P , NPO status , Patient's Chart, lab work & pertinent test results, reviewed documented beta blocker date and time   History of Anesthesia Complications Negative for: history of anesthetic complications  Airway Mallampati: II  TM Distance: >3 FB Neck ROM: full    Dental  (+) Implants, Caps, Missing   Pulmonary shortness of breath and with exertion, sleep apnea and Continuous Positive Airway Pressure Ventilation , neg COPD, neg recent URI, former smoker,    Pulmonary exam normal breath sounds clear to auscultation       Cardiovascular Exercise Tolerance: Poor hypertension, On Medications and On Home Beta Blockers (-) angina+ CAD, + Past MI, + Cardiac Stents, + CABG, + Peripheral Vascular Disease, +CHF and + DOE  Normal cardiovascular exam+ dysrhythmias + pacemaker + Cardiac Defibrillator (-) Valvular Problems/Murmurs Rhythm:regular Rate:Normal     Neuro/Psych neg Seizures  Neuromuscular disease CVA, Residual Symptoms negative psych ROS   GI/Hepatic negative GI ROS, Neg liver ROS,   Endo/Other  negative endocrine ROS  Renal/GU CRFRenal disease  negative genitourinary   Musculoskeletal   Abdominal   Peds  Hematology negative hematology ROS (+)   Anesthesia Other Findings Past Medical History: 06/03/2007: AAA (abdominal aortic aneurysm) [facility])     Comment: [facility]; Dr. Kellie Simmering No date: AICD (automatic cardioverter/defibrillator) pr* No date: Barrett's esophagus No date: Bradycardia No date: CAD (coronary artery disease) No date: CHF (congestive heart failure) (HCC) No date: Cluster headache No date: DDD (degenerative disc disease), lumbar No date: Dyspnea     Comment: WITH EXERTION No date: Edema     Comment: LEFT ANKLE 1997: Fracture of skull base ([place])     Comment:  due to fall No date: Gout 12/1995: History of bladder cancer No date: Hyperlipidemia No date: Hypertension 12/2012: Malignant melanoma ([place])     Comment: right dorsal forearm excised No date: Myocardial infarction     Comment: LAST 2014 No date: Osteoarthritis of knee 10/10/2006: Pacemaker No date: Pre-diabetes No date: Psoriasis 1997: Rib fracture     Comment: due to fall No date: Sleep apnea     Comment: CPAP No date: Stroke ([place]) No date: Venous incompetence   Reproductive/Obstetrics negative OB ROS                             Anesthesia Physical Anesthesia Plan  ASA: III  Anesthesia Plan: MAC   Post-op Pain Management:    Induction:   Airway Management Planned:   Additional Equipment:   Intra-op Plan:   Post-operative Plan:   Informed Consent: I have reviewed the patients History and Physical, chart, labs and discussed the procedure including the risks, benefits and alternatives for the proposed anesthesia with the patient or authorized representative who has indicated his/her understanding and acceptance.   Dental Advisory Given  Plan Discussed with: Anesthesiologist, CRNA and Surgeon  Anesthesia Plan Comments:         Anesthesia Quick Evaluation

## 2016-10-22 NOTE — Anesthesia Postprocedure Evaluation (Signed)
Anesthesia Post Note  Patient: Taylor [name]  Procedure(s) Performed: Procedure(s) (LRB): CATARACT EXTRACTION PHACO AND INTRAOCULAR LENS PLACEMENT (IOC) (Left)  Patient location during evaluation: Short Stay Anesthesia Type: MAC Level of consciousness: awake Pain management: pain level controlled Vital Signs Assessment: post-procedure vital signs reviewed and stable Respiratory status: spontaneous breathing Cardiovascular status: blood pressure returned to baseline Postop Assessment: no headache Anesthetic complications: no    Last Vitals:  Vitals:   10/22/16 0733  BP: (!) 141/67  Pulse: 63  Resp: 18  Temp: (!) 35.9 C    Last Pain:  Vitals:   10/22/16 0733  TempSrc: Tympanic                 Taylor [name]

## 2016-10-22 NOTE — Anesthesia Procedure Notes (Signed)
Procedure Name: MAC Date/Time: 10/22/2016 8:50 AM Performed by: Allean Found Pre-anesthesia Checklist: Patient identified, Emergency Drugs available, Suction available, Patient being monitored and Timeout performed Patient Re-evaluated:Patient Re-evaluated prior to inductionOxygen Delivery Method: Nasal cannula Placement Confirmation: positive ETCO2

## 2016-10-22 NOTE — H&P (Signed)
All labs reviewed. Abnormal studies sent to patients PCP when indicated.  Previous H&P reviewed, patient examined, there are NO CHANGES.  Taylor [name] LOUIS11/28/20178:45 AM

## 2016-11-05 ENCOUNTER — Other Ambulatory Visit: Payer: Self-pay

## 2016-11-05 ENCOUNTER — Encounter: Payer: Self-pay | Admitting: Family Medicine

## 2016-11-05 ENCOUNTER — Ambulatory Visit (INDEPENDENT_AMBULATORY_CARE_PROVIDER_SITE_OTHER): Payer: Medicare Other | Admitting: Family Medicine

## 2016-11-05 VITALS — BP 124/78 | HR 59 | Temp 97.8°F | Resp 16 | Wt 223.6 lb

## 2016-11-05 DIAGNOSIS — R05 Cough: Secondary | ICD-10-CM | POA: Diagnosis not present

## 2016-11-05 DIAGNOSIS — J069 Acute upper respiratory infection, unspecified: Secondary | ICD-10-CM | POA: Diagnosis not present

## 2016-11-05 DIAGNOSIS — R5381 Other malaise: Secondary | ICD-10-CM | POA: Diagnosis not present

## 2016-11-05 DIAGNOSIS — R059 Cough, unspecified: Secondary | ICD-10-CM

## 2016-11-05 LAB — POCT INFLUENZA A/B
INFLUENZA B, POC: NEGATIVE
Influenza A, POC: NEGATIVE

## 2016-11-05 MED ORDER — HYDROCODONE-HOMATROPINE 5-1.5 MG/5ML PO SYRP: 5.0000 mL | ORAL_SOLUTION | Freq: Three times a day (TID) | ORAL | 0 refills | Status: DC | PRN

## 2016-11-05 MED ORDER — DOXYCYCLINE HYCLATE 100 MG PO TABS: 100.0000 mg | ORAL_TABLET | Freq: Two times a day (BID) | ORAL | 0 refills | Status: DC

## 2016-11-05 NOTE — Progress Notes (Signed)
Patient: Taylor [name] Male    DOB: 05/30/1941   75 y.o.   MRN: [id number] Visit Date: 11/05/2016  Today's Provider: Vernie Murders, PA   Chief Complaint  Patient presents with  . URI   Subjective:    URI   This is a new problem. Episode onset: 7-10 days ago. The problem has been unchanged. There has been no fever. Associated symptoms include congestion, coughing, headaches and rhinorrhea. Pertinent negatives include no sore throat. Treatments tried: Nyquil and Dayquil with cough drop.   Past Medical History:  Diagnosis Date  . AAA (abdominal aortic aneurysm) [facility]) 06/03/2007   [facility]; Dr. Kellie Simmering  . AICD (automatic cardioverter/defibrillator) present   . Barrett's esophagus   . Bradycardia   . CAD (coronary artery disease)   . CHF (congestive heart failure) ([place])   . Cluster headache   . DDD (degenerative disc disease), lumbar   . Dyspnea    WITH EXERTION  . Edema    LEFT ANKLE  . Fracture of skull base ([place]) 1997   due to fall  . Gout   . History of bladder cancer 12/1995  . Hyperlipidemia   . Hypertension   . Malignant melanoma ([place]) 12/2012   right dorsal forearm excised  . Myocardial infarction    LAST 2014  . Osteoarthritis of knee   . Pacemaker 10/10/2006  . Pre-diabetes   . Psoriasis   . Rib fracture 1997   due to fall  . Sleep apnea    CPAP  . Stroke ([place])   . Venous incompetence    Past Surgical History:  Procedure Laterality Date  . ABDOMINAL AORTIC ANEURYSM REPAIR  06/03/2007   [facility]; Dr. Kellie Simmering  . ANGIOPLASTY  1994   MI  . BLADDER TUMOR EXCISION  12/1995  . CATARACT EXTRACTION W/PHACO Left 10/22/2016   Procedure: CATARACT EXTRACTION PHACO AND INTRAOCULAR LENS PLACEMENT (IOC);  Surgeon: Birder Robson, MD;  Location: [facility] ORS;  Service: Ophthalmology;  Laterality: Left;  [place] 47.7 AP% 18.4 CDE 8.78 Fluid pack lot # [id number]  . CORONARY ANGIOPLASTY     STENTS X 5  . CORONARY ARTERY BYPASS  GRAFT  09/22/2006   four  . ELBOW BURSA SURGERY     DUE TO GOUT  . MELANOMA EXCISION  12/2012   Right forearm   Family History  Problem Relation Age of Onset  . Cancer Mother     Melanoma skin cancer  . Heart attack Father 27  . Cancer Father     throat cancer  . Arthritis Brother    Allergies  Allergen Reactions  . Amlodipine Besylate Swelling  . Crestor [Rosuvastatin]     Muscle cramps and pain  . Rocephin [Ceftriaxone]     unknown     Previous Medications   ALLOPURINOL (ZYLOPRIM) 300 MG TABLET    TAKE 1 TABLET BY MOUTH EVERY DAY   ASPIRIN 81 MG EC TABLET    Take 1 tablet by mouth daily.   ATORVASTATIN (LIPITOR) 40 MG TABLET    TAKE ONE TABLET BY MOUTH EVERY DAY   CARVEDILOL (COREG) 12.5 MG TABLET    TAKE 1 TABLET BY MOUTH DAILY   CYANOCOBALAMIN (VITAMIN B-12 CR) 1000 MCG TBCR    Take 1,000 mcg by mouth daily.    FOLIC ACID (FOLVITE) 1 MG TABLET    TK 3 TS PO D ON DAYS THAT YOU DO NOT TAKE METHOTREXATE   FUROSEMIDE (LASIX) 20 MG TABLET  Take 40-80 mg by mouth daily.    FUROSEMIDE (LASIX) 40 MG TABLET    TK 1 TO 2 TS PO D   GLUCOSAMINE-VITAMIN D PO    Take 1 tablet by mouth daily.   INDOMETHACIN (INDOCIN) 50 MG CAPSULE    TAKE 1 CAPSULE BY MOUTH THREE TIMES DAILY AS NEEDED   LOSARTAN (COZAAR) 50 MG TABLET    Take 1 tablet (50 mg total) by mouth daily.   METHOTREXATE (RHEUMATREX) 2.5 MG TABLET       PANTOPRAZOLE (PROTONIX) 40 MG TABLET    TAKE 1 TABLET BY MOUTH EVERY DAY   TRIAMCINOLONE OINTMENT (KENALOG) 0.1 %    Apply 1 application topically daily.     Review of Systems  Constitutional: Negative.   HENT: Positive for congestion and rhinorrhea. Negative for sore throat.   Respiratory: Positive for cough.   Cardiovascular: Negative.   Neurological: Positive for headaches.    Social History  Substance Use Topics  . Smoking status: Former Smoker    Packs/day: 1.00    Types: Cigarettes    Quit date: 11/26/1999  . Smokeless tobacco: Never Used  . Alcohol use No    Objective:   BP 124/78 (BP Location: Right Arm, Patient Position: Sitting, Cuff Size: Normal)   Pulse (!) 59   Temp 97.8 F (36.6 C) (Oral)   Resp 16   Wt 223 lb 9.6 oz (101.4 kg)   SpO2 95%   BMI 32.08 kg/m   Physical Exam  Constitutional: He is oriented to person, place, and time. He appears well-developed and well-nourished.  HENT:  Head: Normocephalic.  Right Ear: External ear normal.  Left Ear: External ear normal.  Nose: Nose normal.  Mouth/Throat: Oropharynx is clear and moist.  Some clear nasal mucus. No sinus tenderness but no transillumination of maxillary sinuses.  Neck: Neck supple.  Cardiovascular: Normal rate and regular rhythm.   Pulmonary/Chest: Effort normal and breath sounds normal.  Abdominal: Soft. Bowel sounds are normal.  Lymphadenopathy:    He has no cervical adenopathy.  Neurological: He is alert and oriented to person, place, and time.  Psychiatric: He has a normal mood and affect. His behavior is normal.      Assessment & Plan:     1. Cough Onset over the past 7-10 days. Wife has had the same symptoms. Some yellow sputum with headache and rhinorrhea. May use Dayquil or Mucinex-DM for congestion. Will add Hycodan to control cough at night that has caused him to feel lightheaded at times with a hard hacking episode. Flu test negative. Increase fluid intake and recheck prn. - POCT Influenza A/B - HYDROcodone-homatropine (HYCODAN) 5-1.5 MG/5ML syrup; Take 5 mLs by mouth every 8 (eight) hours as needed for cough.  Dispense: 120 mL; Refill: 0  2. Malaise Onset with cough and bodyahces over the past 7-10 days. Flu test negative. May use OTC analgesic for aches and pains. No fever documented. Suspected secondary to URI. - POCT Influenza A/B  3. Upper respiratory tract infection, unspecified type Onset over the past past week. No transillumination of maxillary sinuses without pain. Some headache (bitemporal). Will history of methotrexate for psoriasis  and CAD, will give antibiotic and recheck prn. Increase fluid intake and may use Tylenol prn headache. - doxycycline (VIBRA-TABS) 100 MG tablet; Take 1 tablet (100 mg total) by mouth 2 (two) times daily.  Dispense: 20 tablet; Refill: 0

## 2016-11-22 ENCOUNTER — Other Ambulatory Visit: Payer: Self-pay | Admitting: Family Medicine

## 2016-11-30 ENCOUNTER — Other Ambulatory Visit: Payer: Self-pay | Admitting: Family Medicine

## 2016-12-01 ENCOUNTER — Other Ambulatory Visit: Payer: Self-pay | Admitting: Family Medicine

## 2016-12-04 DIAGNOSIS — H2511 Age-related nuclear cataract, right eye: Secondary | ICD-10-CM | POA: Diagnosis not present

## 2016-12-10 ENCOUNTER — Encounter
Admission: RE | Disposition: A | Discharge status: Home / Self Care | Payer: Self-pay | Source: Ambulatory Visit | Attending: Ophthalmology

## 2016-12-10 ENCOUNTER — Ambulatory Visit: Payer: Medicare Other | Admitting: Anesthesiology

## 2016-12-10 ENCOUNTER — Ambulatory Visit
Admission: RE | Admit: 2016-12-10 | Discharge: 2016-12-10 | Disposition: A | Discharge status: Home / Self Care | Payer: Medicare Other | Source: Ambulatory Visit | Attending: Ophthalmology | Admitting: Ophthalmology

## 2016-12-10 ENCOUNTER — Encounter: Payer: Self-pay | Admitting: *Deleted

## 2016-12-10 DIAGNOSIS — K219 Gastro-esophageal reflux disease without esophagitis: Secondary | ICD-10-CM | POA: Diagnosis not present

## 2016-12-10 DIAGNOSIS — J449 Chronic obstructive pulmonary disease, unspecified: Secondary | ICD-10-CM | POA: Insufficient documentation

## 2016-12-10 DIAGNOSIS — Z87891 Personal history of nicotine dependence: Secondary | ICD-10-CM | POA: Insufficient documentation

## 2016-12-10 DIAGNOSIS — Z955 Presence of coronary angioplasty implant and graft: Secondary | ICD-10-CM | POA: Diagnosis not present

## 2016-12-10 DIAGNOSIS — E785 Hyperlipidemia, unspecified: Secondary | ICD-10-CM | POA: Insufficient documentation

## 2016-12-10 DIAGNOSIS — I25810 Atherosclerosis of coronary artery bypass graft(s) without angina pectoris: Secondary | ICD-10-CM | POA: Diagnosis not present

## 2016-12-10 DIAGNOSIS — I739 Peripheral vascular disease, unspecified: Secondary | ICD-10-CM | POA: Diagnosis not present

## 2016-12-10 DIAGNOSIS — M1990 Unspecified osteoarthritis, unspecified site: Secondary | ICD-10-CM | POA: Diagnosis not present

## 2016-12-10 DIAGNOSIS — H2511 Age-related nuclear cataract, right eye: Secondary | ICD-10-CM | POA: Diagnosis not present

## 2016-12-10 DIAGNOSIS — I110 Hypertensive heart disease with heart failure: Secondary | ICD-10-CM | POA: Insufficient documentation

## 2016-12-10 DIAGNOSIS — I509 Heart failure, unspecified: Secondary | ICD-10-CM | POA: Insufficient documentation

## 2016-12-10 DIAGNOSIS — G4730 Sleep apnea, unspecified: Secondary | ICD-10-CM | POA: Insufficient documentation

## 2016-12-10 DIAGNOSIS — L409 Psoriasis, unspecified: Secondary | ICD-10-CM | POA: Diagnosis not present

## 2016-12-10 HISTORY — DX: Gastro-esophageal reflux disease without esophagitis: K21.9

## 2016-12-10 HISTORY — DX: Malignant neoplasm of bladder, unspecified: C67.9

## 2016-12-10 HISTORY — DX: Pneumonia, unspecified organism: J18.9

## 2016-12-10 HISTORY — PX: CATARACT EXTRACTION W/PHACO: SHX586

## 2016-12-10 SURGERY — PHACOEMULSIFICATION, CATARACT, WITH IOL INSERTION
Anesthesia: Monitor Anesthesia Care | Site: Eye | Laterality: Right | Wound class: Clean

## 2016-12-10 MED ORDER — FENTANYL CITRATE (PF) 100 MCG/2ML IJ SOLN
INTRAMUSCULAR | Status: DC | PRN
  Administered 2016-12-10: 50 ug via INTRAVENOUS

## 2016-12-10 MED ORDER — MOXIFLOXACIN HCL 0.5 % OP SOLN
OPHTHALMIC | Status: AC
  Administered 2016-12-10: 1 [drp] via OPHTHALMIC
  Filled 2016-12-10: qty 3

## 2016-12-10 MED ORDER — POVIDONE-IODINE 5 % OP SOLN
OPHTHALMIC | Status: AC
  Filled 2016-12-10: qty 30

## 2016-12-10 MED ORDER — MIDAZOLAM HCL 2 MG/2ML IJ SOLN
INTRAMUSCULAR | Status: AC
  Filled 2016-12-10: qty 2

## 2016-12-10 MED ORDER — EPINEPHRINE PF 1 MG/ML IJ SOLN
INTRAMUSCULAR | Status: AC
  Filled 2016-12-10: qty 2

## 2016-12-10 MED ORDER — CARBACHOL 0.01 % IO SOLN
INTRAOCULAR | Status: DC | PRN
  Administered 2016-12-10: .5 mL via INTRAOCULAR

## 2016-12-10 MED ORDER — ARMC OPHTHALMIC DILATING DROPS
1.0000 "application " | OPHTHALMIC | Status: AC
  Administered 2016-12-10 (×3): 1 via OPHTHALMIC

## 2016-12-10 MED ORDER — NA CHONDROIT SULF-NA HYALURON 40-17 MG/ML IO SOLN
INTRAOCULAR | Status: AC
  Filled 2016-12-10: qty 1

## 2016-12-10 MED ORDER — FENTANYL CITRATE (PF) 100 MCG/2ML IJ SOLN
INTRAMUSCULAR | Status: AC
  Filled 2016-12-10: qty 2

## 2016-12-10 MED ORDER — SODIUM CHLORIDE 0.9 % IV SOLN
INTRAVENOUS | Status: DC
  Administered 2016-12-10: 08:00:00 via INTRAVENOUS

## 2016-12-10 MED ORDER — LIDOCAINE HCL (PF) 4 % IJ SOLN
INTRAMUSCULAR | Status: AC
  Filled 2016-12-10: qty 5

## 2016-12-10 MED ORDER — EPINEPHRINE PF 1 MG/ML IJ SOLN
INTRAOCULAR | Status: DC | PRN
  Administered 2016-12-10: 1 mL via OPHTHALMIC

## 2016-12-10 MED ORDER — NA CHONDROIT SULF-NA HYALURON 40-17 MG/ML IO SOLN
INTRAOCULAR | Status: DC | PRN
  Administered 2016-12-10: 1 mL via INTRAOCULAR

## 2016-12-10 MED ORDER — LIDOCAINE HCL (PF) 4 % IJ SOLN
INTRAMUSCULAR | Status: DC | PRN
  Administered 2016-12-10: 2.25 mL via OPHTHALMIC

## 2016-12-10 MED ORDER — ARMC OPHTHALMIC DILATING DROPS
OPHTHALMIC | Status: AC
  Administered 2016-12-10: 1 via OPHTHALMIC
  Filled 2016-12-10: qty 0.4

## 2016-12-10 MED ORDER — MOXIFLOXACIN HCL 0.5 % OP SOLN
1.0000 [drp] | OPHTHALMIC | Status: DC | PRN
Start: 2016-12-10 — End: 2016-12-10
  Administered 2016-12-10 (×3): 1 [drp] via OPHTHALMIC

## 2016-12-10 MED ORDER — MOXIFLOXACIN HCL 0.5 % OP SOLN
OPHTHALMIC | Status: DC | PRN
  Administered 2016-12-10: 1 [drp] via OPHTHALMIC

## 2016-12-10 MED ORDER — MIDAZOLAM HCL 2 MG/2ML IJ SOLN
INTRAMUSCULAR | Status: DC | PRN
  Administered 2016-12-10: 1 mg via INTRAVENOUS

## 2016-12-10 SURGICAL SUPPLY — 21 items
CANNULA ANT/CHMB 27GA (MISCELLANEOUS) ×3 IMPLANT
CUP MEDICINE 2OZ PLAST GRAD ST (MISCELLANEOUS) ×3 IMPLANT
GLOVE BIO SURGEON STRL SZ8 (GLOVE) ×3 IMPLANT
GLOVE BIOGEL M 6.5 STRL (GLOVE) ×3 IMPLANT
GLOVE SURG LX 8.0 MICRO (GLOVE) ×2
GLOVE SURG LX STRL 8.0 MICRO (GLOVE) ×1 IMPLANT
GOWN STRL REUS W/ TWL LRG LVL3 (GOWN DISPOSABLE) ×2 IMPLANT
GOWN STRL REUS W/TWL LRG LVL3 (GOWN DISPOSABLE) ×4
LENS IOL TECNIS ITEC 18.5 (Intraocular Lens) ×3 IMPLANT
PACK CATARACT (MISCELLANEOUS) ×3 IMPLANT
PACK CATARACT BRASINGTON LX (MISCELLANEOUS) ×3 IMPLANT
PACK EYE AFTER SURG (MISCELLANEOUS) ×3 IMPLANT
SOL BSS BAG (MISCELLANEOUS) ×3
SOL PREP PVP 2OZ (MISCELLANEOUS) ×3
SOLUTION BSS BAG (MISCELLANEOUS) ×1 IMPLANT
SOLUTION PREP PVP 2OZ (MISCELLANEOUS) ×1 IMPLANT
SYR 3ML LL SCALE MARK (SYRINGE) ×3 IMPLANT
SYR 5ML LL (SYRINGE) ×3 IMPLANT
SYR TB 1ML 27GX1/2 LL (SYRINGE) ×3 IMPLANT
WATER STERILE IRR 250ML POUR (IV SOLUTION) ×3 IMPLANT
WIPE NON LINTING 3.25X3.25 (MISCELLANEOUS) ×3 IMPLANT

## 2016-12-10 NOTE — Discharge Instructions (Signed)
Eye Surgery Discharge Instructions  Expect mild scratchy sensation or mild soreness. DO NOT RUB YOUR EYE!  The day of surgery:  Minimal physical activity, but bed rest is not required  No reading, computer work, or close hand work  No bending, lifting, or straining.  May watch TV  For 24 hours:  No driving, legal decisions, or alcoholic beverages  Safety precautions  Eat anything you prefer: It is better to start with liquids, then soup then solid foods.  _____ Eye patch should be worn until postoperative exam tomorrow.  ____ Solar shield eyeglasses should be worn for comfort in the sunlight/patch while sleeping  Resume all regular medications including aspirin or Coumadin if these were discontinued prior to surgery. You may shower, bathe, shave, or wash your hair. Tylenol may be taken for mild discomfort.  Call your doctor if you experience significant pain, nausea, or vomiting, fever > 101 or other signs of infection. [phone] or [phone] Specific instructions:  Follow-up Information    PORFILIO,WILLIAM LOUIS, MD Follow up.   Specialty:  Ophthalmology Why:  12-11-16 at11:00 Contact information: [address] [place] [address] [phone]          Eye Surgery Discharge Instructions  Expect mild scratchy sensation or mild soreness. DO NOT RUB YOUR EYE!  The day of surgery:  Minimal physical activity, but bed rest is not required  No reading, computer work, or close hand work  No bending, lifting, or straining.  May watch TV  For 24 hours:  No driving, legal decisions, or alcoholic beverages  Safety precautions  Eat anything you prefer: It is better to start with liquids, then soup then solid foods.  _____ Eye patch should be worn until postoperative exam tomorrow.  ____ Solar shield eyeglasses should be worn for comfort in the sunlight/patch while sleeping  Resume all regular medications including aspirin or Coumadin if these  were discontinued prior to surgery. You may shower, bathe, shave, or wash your hair. Tylenol may be taken for mild discomfort.  Call your doctor if you experience significant pain, nausea, or vomiting, fever > 101 or other signs of infection. [phone] or [phone] Specific instructions:  Follow-up Information    PORFILIO,WILLIAM LOUIS, MD Follow up.   Specialty:  Ophthalmology Why:  12-11-16 at11:00 Contact information: [place] [address] [phone]

## 2016-12-10 NOTE — Transfer of Care (Signed)
Immediate Anesthesia Transfer of Care Note  Patient: Taylor [name]  Procedure(s) Performed: Procedure(s) with comments: CATARACT EXTRACTION PHACO AND INTRAOCULAR LENS PLACEMENT (IOC) (Right) - [place] 00:39 AP% 23.3 CDE 9.13 Fluid pack lot # [id number]  Patient Location: PACU  Anesthesia Type:MAC  Level of Consciousness: awake, alert  and oriented  Airway & Oxygen Therapy: Patient Spontanous Breathing  Post-op Assessment: Report given to RN and Post -op Vital signs reviewed and stable  Post vital signs: Reviewed and stable  Last Vitals:  Vitals:   12/10/16 0820 12/10/16 0954  BP: (!) 150/91 131/88  Pulse: 74 (!) 55  Resp: 16 16  Temp: 36.4 C     Last Pain:  Vitals:   12/10/16 0954  TempSrc: Tympanic         Complications: No apparent anesthesia complications

## 2016-12-10 NOTE — Op Note (Signed)
PREOPERATIVE DIAGNOSIS:  Nuclear sclerotic cataract of the right eye.   POSTOPERATIVE DIAGNOSIS:  nuclear sclerotic cataract right eye   OPERATIVE PROCEDURE: Procedure(s): CATARACT EXTRACTION PHACO AND INTRAOCULAR LENS PLACEMENT (IOC)   SURGEON:  Birder Robson, MD.   ANESTHESIA:  Anesthesiologist: Martha Clan, MD CRNA: Darlyne Russian, CRNA  1.      Managed anesthesia care. 2.      0.53ml of Shugarcaine was instilled in the eye following the paracentesis.   COMPLICATIONS:  None.   TECHNIQUE:   Stop and chop   DESCRIPTION OF PROCEDURE:  The patient was examined and consented in the preoperative holding area where the aforementioned topical anesthesia was applied to the right eye and then brought back to the Operating Room where the right eye was prepped and draped in the usual sterile ophthalmic fashion and a lid speculum was placed. A paracentesis was created with the side port blade and the anterior chamber was filled with viscoelastic. A near clear corneal incision was performed with the steel keratome. A continuous curvilinear capsulorrhexis was performed with a cystotome followed by the capsulorrhexis forceps. Hydrodissection and hydrodelineation were carried out with BSS on a blunt cannula. The lens was removed in a stop and chop  technique and the remaining cortical material was removed with the irrigation-aspiration handpiece. The capsular bag was inflated with viscoelastic and the Technis [id number]  lens was placed in the capsular bag without complication. The remaining viscoelastic was removed from the eye with the irrigation-aspiration handpiece. The wounds were hydrated. The anterior chamber was flushed with Miostat and the eye was inflated to physiologic pressure. 0.56ml of Vigamox was placed in the anterior chamber. The wounds were found to be water tight. The eye was dressed with Vigamox. The patient was given protective glasses to wear throughout the day and a shield with which to  sleep tonight. The patient was also given drops with which to begin a drop regimen today and will follow-up with me in one day.  Implant Name Type Inv. Item Serial No. Manufacturer Lot No. LRB No. Used  LENS IOL DIOP 18.5 - [id number] Intraocular Lens LENS IOL DIOP 18.5 [id number]   Right 1   Procedure(s) with comments: CATARACT EXTRACTION PHACO AND INTRAOCULAR LENS PLACEMENT (IOC) (Right) - [place] 00:39 AP% 23.3 CDE 9.13 Fluid pack lot # [id number]  Electronically signed: [place] 12/10/2016 9:52 AM

## 2016-12-10 NOTE — H&P (Signed)
All labs reviewed. Abnormal studies sent to patients PCP when indicated.  Previous H&P reviewed, patient examined, there are NO CHANGES.  Sharlette Jansma LOUIS1/16/20189:26 AM

## 2016-12-10 NOTE — Anesthesia Postprocedure Evaluation (Signed)
Anesthesia Post Note  Patient: Taylor [name]  Procedure(s) Performed: Procedure(s) (LRB): CATARACT EXTRACTION PHACO AND INTRAOCULAR LENS PLACEMENT (IOC) (Right)  Patient location during evaluation: PACU Anesthesia Type: MAC Level of consciousness: awake and alert Pain management: pain level controlled Vital Signs Assessment: post-procedure vital signs reviewed and stable Respiratory status: spontaneous breathing and nonlabored ventilation Cardiovascular status: stable Postop Assessment: no signs of nausea or vomiting Anesthetic complications: no     Last Vitals:  Vitals:   12/10/16 0820 12/10/16 0954  BP: (!) 150/91 131/88  Pulse: 74 (!) 55  Resp: 16 16  Temp: 36.4 C 36.3 C    Last Pain:  Vitals:   12/10/16 0954  TempSrc: Temporal                 Darlyne Russian

## 2016-12-10 NOTE — Anesthesia Preprocedure Evaluation (Signed)
Anesthesia Evaluation  Patient identified by MRN, date of birth, ID band Patient awake    Reviewed: Allergy & Precautions, H&P , NPO status , Patient's Chart, lab work & pertinent test results, reviewed documented beta blocker date and time   History of Anesthesia Complications Negative for: history of anesthetic complications  Airway Mallampati: II  TM Distance: >3 FB Neck ROM: full    Dental  (+) Implants, Caps, Missing   Pulmonary shortness of breath and with exertion, sleep apnea and Continuous Positive Airway Pressure Ventilation , neg COPD, Recent URI , Resolved, former smoker,    Pulmonary exam normal breath sounds clear to auscultation       Cardiovascular Exercise Tolerance: Poor hypertension, On Medications and On Home Beta Blockers (-) angina+ CAD, + Past MI, + Cardiac Stents, + CABG, + Peripheral Vascular Disease, +CHF and + DOE  Normal cardiovascular exam+ dysrhythmias + pacemaker + Cardiac Defibrillator (-) Valvular Problems/Murmurs Rhythm:regular Rate:Normal     Neuro/Psych neg Seizures  Neuromuscular disease CVA, Residual Symptoms negative psych ROS   GI/Hepatic Neg liver ROS, GERD  ,  Endo/Other  negative endocrine ROS  Renal/GU CRFRenal disease  negative genitourinary   Musculoskeletal   Abdominal   Peds  Hematology negative hematology ROS (+)   Anesthesia Other Findings Past Medical History: 06/03/2007: AAA (abdominal aortic aneurysm) [facility])     Comment: [facility]; Dr. Kellie Simmering No date: AICD (automatic cardioverter/defibrillator) pr* No date: Barrett's esophagus No date: Bradycardia No date: CAD (coronary artery disease) No date: CHF (congestive heart failure) (HCC) No date: Cluster headache No date: DDD (degenerative disc disease), lumbar No date: Dyspnea     Comment: WITH EXERTION No date: Edema     Comment: LEFT ANKLE 1997: Fracture of skull base ([place])     Comment:  due to fall No date: Gout 12/1995: History of bladder cancer No date: Hyperlipidemia No date: Hypertension 12/2012: Malignant melanoma ([place])     Comment: right dorsal forearm excised No date: Myocardial infarction     Comment: LAST 2014 No date: Osteoarthritis of knee 10/10/2006: Pacemaker No date: Pre-diabetes No date: Psoriasis 1997: Rib fracture     Comment: due to fall No date: Sleep apnea     Comment: CPAP No date: Stroke ([place]) No date: Venous incompetence   Reproductive/Obstetrics negative OB ROS                             Anesthesia Physical  Anesthesia Plan  ASA: III  Anesthesia Plan: MAC   Post-op Pain Management:    Induction:   Airway Management Planned:   Additional Equipment:   Intra-op Plan:   Post-operative Plan:   Informed Consent: I have reviewed the patients History and Physical, chart, labs and discussed the procedure including the risks, benefits and alternatives for the proposed anesthesia with the patient or authorized representative who has indicated his/her understanding and acceptance.   Dental Advisory Given  Plan Discussed with: Anesthesiologist, CRNA and Surgeon  Anesthesia Plan Comments:         Anesthesia Quick Evaluation

## 2016-12-13 DIAGNOSIS — G4730 Sleep apnea, unspecified: Secondary | ICD-10-CM | POA: Diagnosis not present

## 2017-01-07 ENCOUNTER — Other Ambulatory Visit: Payer: Self-pay | Admitting: Family Medicine

## 2017-01-09 ENCOUNTER — Other Ambulatory Visit: Payer: Self-pay | Admitting: Family Medicine

## 2017-01-31 ENCOUNTER — Other Ambulatory Visit: Payer: Self-pay | Admitting: Family Medicine

## 2017-02-17 DIAGNOSIS — I4901 Ventricular fibrillation: Secondary | ICD-10-CM | POA: Diagnosis not present

## 2017-02-17 DIAGNOSIS — I10 Essential (primary) hypertension: Secondary | ICD-10-CM | POA: Diagnosis not present

## 2017-02-17 DIAGNOSIS — I25810 Atherosclerosis of coronary artery bypass graft(s) without angina pectoris: Secondary | ICD-10-CM | POA: Diagnosis not present

## 2017-02-17 DIAGNOSIS — I255 Ischemic cardiomyopathy: Secondary | ICD-10-CM | POA: Diagnosis not present

## 2017-02-17 DIAGNOSIS — I714 Abdominal aortic aneurysm, without rupture: Secondary | ICD-10-CM | POA: Diagnosis not present

## 2017-02-19 ENCOUNTER — Other Ambulatory Visit: Payer: Self-pay | Admitting: Family Medicine

## 2017-02-26 ENCOUNTER — Ambulatory Visit: Payer: Medicare Other

## 2017-02-26 VITALS — BP 120/78 | HR 68 | Temp 98.8°F | Ht 70.0 in | Wt 221.0 lb

## 2017-02-26 DIAGNOSIS — Z0000 Encounter for general adult medical examination without abnormal findings: Secondary | ICD-10-CM

## 2017-02-26 NOTE — Patient Instructions (Signed)
Taylor [name] , Thank you for taking time to come for your Medicare Wellness Visit. I appreciate your ongoing commitment to your health goals. Please review the following plan we discussed and let me know if I can assist you in the future.   Screening recommendations/referrals: Colonoscopy: last 07/06/14 Recommended yearly ophthalmology/optometry visit for glaucoma screening and checkup Recommended yearly dental visit for hygiene and checkup  Vaccinations: Influenza vaccine: last done 10/09/16 Pneumococcal vaccine: completed series Tdap vaccine: denied Shingles vaccine: denied    Advanced directives: Requested copy of living will, denied POA form.  Next appointment: None  Preventive Care 65 Years and Older, Male Preventive care refers to lifestyle choices and visits with your health care provider that can promote health and wellness. What does preventive care include?  A yearly physical exam. This is also called an annual well check.  Dental exams once or twice a year.  Routine eye exams. Ask your health care provider how often you should have your eyes checked.  Personal lifestyle choices, including:  Daily care of your teeth and gums.  Regular physical activity.  Eating a healthy diet.  Avoiding tobacco and drug use.  Limiting alcohol use.  Practicing safe sex.  Taking low doses of aspirin every day.  Taking vitamin and mineral supplements as recommended by your health care provider. What happens during an annual well check? The services and screenings done by your health care provider during your annual well check will depend on your age, overall health, lifestyle risk factors, and family history of disease. Counseling  Your health care provider may ask you questions about your:  Alcohol use.  Tobacco use.  Drug use.  Emotional well-being.  Home and relationship well-being.  Sexual activity.  Eating habits.  History of falls.  Memory and ability to  understand (cognition).  Work and work Statistician. Screening  You may have the following tests or measurements:  Height, weight, and BMI.  Blood pressure.  Lipid and cholesterol levels. These may be checked every 5 years, or more frequently if you are over 86 years old.  Skin check.  Lung cancer screening. You may have this screening every year starting at age 50 if you have a 30-pack-year history of smoking and currently smoke or have quit within the past 15 years.  Fecal occult blood test (FOBT) of the stool. You may have this test every year starting at age 56.  Flexible sigmoidoscopy or colonoscopy. You may have a sigmoidoscopy every 5 years or a colonoscopy every 10 years starting at age 68.  Prostate cancer screening. Recommendations will vary depending on your family history and other risks.  Hepatitis C blood test.  Hepatitis B blood test.  Sexually transmitted disease (STD) testing.  Diabetes screening. This is done by checking your blood sugar (glucose) after you have not eaten for a while (fasting). You may have this done every 1-3 years.  Abdominal aortic aneurysm (AAA) screening. You may need this if you are a current or former smoker.  Osteoporosis. You may be screened starting at age 16 if you are at high risk. Talk with your health care provider about your test results, treatment options, and if necessary, the need for more tests. Vaccines  Your health care provider may recommend certain vaccines, such as:  Influenza vaccine. This is recommended every year.  Tetanus, diphtheria, and acellular pertussis (Tdap, Td) vaccine. You may need a Td booster every 10 years.  Zoster vaccine. You may need this after age 6.  Pneumococcal 13-valent conjugate (PCV13) vaccine. One dose is recommended after age 53.  Pneumococcal polysaccharide (PPSV23) vaccine. One dose is recommended after age 73. Talk to your health care provider about which screenings and vaccines you  need and how often you need them. This information is not intended to replace advice given to you by your health care provider. Make sure you discuss any questions you have with your health care provider. Document Released: 12/08/2015 Document Revised: 07/31/2016 Document Reviewed: 09/12/2015 Elsevier Interactive Patient Education  2017 [place] Prevention in the Home Falls can cause injuries. They can happen to people of all ages. There are many things you can do to make your home safe and to help prevent falls. What can I do on the outside of my home?  Regularly fix the edges of walkways and driveways and fix any cracks.  Remove anything that might make you trip as you walk through a door, such as a raised step or threshold.  Trim any bushes or trees on the path to your home.  Use bright outdoor lighting.  Clear any walking paths of anything that might make someone trip, such as rocks or tools.  Regularly check to see if handrails are loose or broken. Make sure that both sides of any steps have handrails.  Any raised decks and porches should have guardrails on the edges.  Have any leaves, snow, or ice cleared regularly.  Use sand or salt on walking paths during winter.  Clean up any spills in your garage right away. This includes oil or grease spills. What can I do in the bathroom?  Use night lights.  Install grab bars by the toilet and in the tub and shower. Do not use towel bars as grab bars.  Use non-skid mats or decals in the tub or shower.  If you need to sit down in the shower, use a plastic, non-slip stool.  Keep the floor dry. Clean up any water that spills on the floor as soon as it happens.  Remove soap buildup in the tub or shower regularly.  Attach bath mats securely with double-sided non-slip rug tape.  Do not have throw rugs and other things on the floor that can make you trip. What can I do in the bedroom?  Use night lights.  Make sure  that you have a light by your bed that is easy to reach.  Do not use any sheets or blankets that are too big for your bed. They should not hang down onto the floor.  Have a firm chair that has side arms. You can use this for support while you get dressed.  Do not have throw rugs and other things on the floor that can make you trip. What can I do in the kitchen?  Clean up any spills right away.  Avoid walking on wet floors.  Keep items that you use a lot in easy-to-reach places.  If you need to reach something above you, use a strong step stool that has a grab bar.  Keep electrical cords out of the way.  Do not use floor polish or wax that makes floors slippery. If you must use wax, use non-skid floor wax.  Do not have throw rugs and other things on the floor that can make you trip. What can I do with my stairs?  Do not leave any items on the stairs.  Make sure that there are handrails on both sides of the stairs and use them.  Fix handrails that are broken or loose. Make sure that handrails are as long as the stairways.  Check any carpeting to make sure that it is firmly attached to the stairs. Fix any carpet that is loose or worn.  Avoid having throw rugs at the top or bottom of the stairs. If you do have throw rugs, attach them to the floor with carpet tape.  Make sure that you have a light switch at the top of the stairs and the bottom of the stairs. If you do not have them, ask someone to add them for you. What else can I do to help prevent falls?  Wear shoes that:  Do not have high heels.  Have rubber bottoms.  Are comfortable and fit you well.  Are closed at the toe. Do not wear sandals.  If you use a stepladder:  Make sure that it is fully opened. Do not climb a closed stepladder.  Make sure that both sides of the stepladder are locked into place.  Ask someone to hold it for you, if possible.  Clearly mark and make sure that you can see:  Any grab bars or  handrails.  First and last steps.  Where the edge of each step is.  Use tools that help you move around (mobility aids) if they are needed. These include:  Canes.  Walkers.  Scooters.  Crutches.  Turn on the lights when you go into a dark area. Replace any light bulbs as soon as they burn out.  Set up your furniture so you have a clear path. Avoid moving your furniture around.  If any of your floors are uneven, fix them.  If there are any pets around you, be aware of where they are.  Review your medicines with your doctor. Some medicines can make you feel dizzy. This can increase your chance of falling. Ask your doctor what other things that you can do to help prevent falls. This information is not intended to replace advice given to you by your health care provider. Make sure you discuss any questions you have with your health care provider. Document Released: 09/07/2009 Document Revised: 04/18/2016 Document Reviewed: 12/16/2014 Elsevier Interactive Patient Education  2017 [employer].

## 2017-02-26 NOTE — Progress Notes (Signed)
Subjective:   Taylor [name] is a 76 y.o. male who presents for Medicare Annual/Subsequent preventive examination.  Review of Systems:  N/A Cardiac Risk Factors include: advanced age (>33men, >63 women);dyslipidemia;hypertension;male gender;obesity (BMI >30kg/m2)     Objective:    Vitals: BP 120/78 (BP Location: Right Arm)   Pulse 68   Temp 98.8 F (37.1 C) (Oral)   Ht 5\' 10"  (1.778 m)   Wt 221 lb (100.2 kg)   BMI 31.71 kg/m   Body mass index is 31.71 kg/m.  Tobacco History  Smoking Status  . Former Smoker  . Packs/day: 1.00  . Types: Cigarettes  . Quit date: 11/26/1999  Smokeless Tobacco  . Never Used     Counseling given: Not Answered   Past Medical History:  Diagnosis Date  . AAA (abdominal aortic aneurysm) [facility]) 06/03/2007   [facility]; Dr. Kellie Simmering  . AICD (automatic cardioverter/defibrillator) present   . Barrett's esophagus   . Bladder cancer ([place])   . Bradycardia   . CAD (coronary artery disease)   . CHF (congestive heart failure) ([place])   . Cluster headache   . DDD (degenerative disc disease), lumbar   . Dyspnea    WITH EXERTION  . Edema    LEFT ANKLE  . Fracture of skull base ([place]) 1997   due to fall  . GERD (gastroesophageal reflux disease)   . Gout   . History of bladder cancer 12/1995  . Hyperlipidemia   . Hypertension   . Malignant melanoma ([place]) 12/2012   right dorsal forearm excised  . Myocardial infarction    LAST 2014  . Osteoarthritis of knee   . Pacemaker 10/10/2006  . Pneumonia    2016  . Pre-diabetes   . Psoriasis   . Rib fracture 1997   due to fall  . Sleep apnea    CPAP  . Stroke ([place])   . Venous incompetence    Past Surgical History:  Procedure Laterality Date  . ABDOMINAL AORTIC ANEURYSM REPAIR  06/03/2007   [facility]; Dr. Kellie Simmering  . ANGIOPLASTY  1994   MI  . BLADDER TUMOR EXCISION  12/1995  . CATARACT EXTRACTION W/PHACO Left 10/22/2016   Procedure: CATARACT EXTRACTION PHACO  AND INTRAOCULAR LENS PLACEMENT (IOC);  Surgeon: Birder Robson, MD;  Location: [facility] ORS;  Service: Ophthalmology;  Laterality: Left;  [place] 47.7 AP% 18.4 CDE 8.78 Fluid pack lot # [id number]  . CATARACT EXTRACTION W/PHACO Right 12/10/2016   Procedure: CATARACT EXTRACTION PHACO AND INTRAOCULAR LENS PLACEMENT (IOC);  Surgeon: Birder Robson, MD;  Location: [facility] ORS;  Service: Ophthalmology;  Laterality: Right;  [place] 00:39 AP% 23.3 CDE 9.13 Fluid pack lot # [id number]  . CORONARY ANGIOPLASTY     STENTS X 5  . CORONARY ARTERY BYPASS GRAFT  09/22/2006   four  . ELBOW BURSA SURGERY     DUE TO GOUT  . INSERT / REPLACE / REMOVE PACEMAKER    . MELANOMA EXCISION  12/2012   Right forearm   Family History  Problem Relation Age of Onset  . Cancer Mother     Melanoma skin cancer  . Heart attack Father 50  . Cancer Father     throat cancer  . Arthritis Brother    History  Sexual Activity  . Sexual activity: Not on file    Outpatient Encounter Prescriptions as of 02/26/2017  Medication Sig  . allopurinol (ZYLOPRIM) 300 MG tablet TAKE 1 TABLET BY MOUTH EVERY DAY  .  aspirin 81 MG EC tablet Take 81 mg by mouth daily.   Marland Kitchen atorvastatin (LIPITOR) 40 MG tablet TAKE 1 TABLET BY MOUTH EVERY DAY  . carvedilol (COREG) 12.5 MG tablet TAKE 1 TABLET BY MOUTH DAILY  . Cyanocobalamin (VITAMIN B-12 CR) 1000 MCG TBCR Take 1,000 mcg by mouth daily.   . furosemide (LASIX) 20 MG tablet Take 80 mg by mouth daily.   Marland Kitchen GLUCOSAMINE-VITAMIN D PO Take 1 tablet by mouth daily.  . indomethacin (INDOCIN) 50 MG capsule TAKE 1 CAPSULE BY MOUTH THREE TIMES DAILY AS NEEDED (Patient taking differently: TAKE 1 CAPSULE BY MOUTH THREE TIMES DAILY AS NEEDED GOUT FLARE)  . losartan (COZAAR) 50 MG tablet TAKE 1 TABLET BY MOUTH DAILY  . pantoprazole (PROTONIX) 40 MG tablet TAKE 1 TABLET BY MOUTH EVERY DAY  . triamcinolone ointment (KENALOG) 0.1 % Apply 1 application topically 2 (two) times daily as needed (itching).   Marland Kitchen doxycycline  (VIBRA-TABS) 100 MG tablet Take 1 tablet (100 mg total) by mouth 2 (two) times daily. (Patient not taking: Reported on 12/03/2016)  . Doxylamine-Phenylephrine-APAP (VICKS SINEX DAYQUIL/NYQUIL) MISC Take 1 Dose by mouth every 6 (six) hours as needed (cold symptoms).  . folic acid (FOLVITE) 1 MG tablet Take 1mg  daily except on days you take methotrexate  . HYDROcodone-homatropine (HYCODAN) 5-1.5 MG/5ML syrup Take 5 mLs by mouth every 8 (eight) hours as needed for cough. (Patient not taking: Reported on 12/03/2016)  . methotrexate (RHEUMATREX) 2.5 MG tablet Take 25 mg by mouth once a week. Wednesdays   No facility-administered encounter medications on file as of 02/26/2017.     Activities of Daily Living In your present state of health, do you have any difficulty performing the following activities: 02/26/2017  Hearing? Y  Vision? Y  Difficulty concentrating or making decisions? N  Walking or climbing stairs? Y  Dressing or bathing? N  Doing errands, shopping? N  Preparing Food and eating ? N  Using the Toilet? N  In the past six months, have you accidently leaked urine? N  Do you have problems with loss of bowel control? N  Managing your Medications? N  Managing your Finances? N  Housekeeping or managing your Housekeeping? N  Some recent data might be hidden    Patient Care Team: Birdie Sons, MD as PCP - General (Family Medicine) Corey Skains, MD as Consulting Physician (Internal Medicine) Birder Robson, MD as Referring Physician (Ophthalmology)   Assessment:    Exercise Activities and Dietary recommendations Current Exercise Habits: Structured exercise class, Type of exercise: treadmill, Time (Minutes): 30, Frequency (Times/Week): 7, Weekly Exercise (Minutes/Week): 210, Exercise limited by: None identified  Goals      Patient Stated   . diet (pt-stated)          Recommend to eat 3 meals a day with healthy snacks in between.       Fall Risk Fall Risk  02/26/2017 05/12/2015   Falls in the past year? No No  Risk for fall due to : - History of fall(s)   Depression Screen PHQ 2/9 Scores 02/26/2017 02/26/2017 05/12/2015  PHQ - 2 Score 0 0 0  PHQ- 9 Score 4 - -    Cognitive Function     6CIT Screen 02/26/2017  What Year? 0 points  What month? 0 points  What time? 0 points  Count back from 20 0 points  Months in reverse 0 points  Repeat phrase 4 points  Total Score 4    Immunization  History  Administered Date(s) Administered  . Influenza Split 09/18/2011  . Influenza, High Dose Seasonal PF 08/16/2014, 10/09/2016  . Influenza,inj,Quad PF,36+ Mos 12/20/2015  . Pneumococcal Conjugate-13 05/09/2014  . Pneumococcal Polysaccharide-23 05/12/2015   Screening Tests Health Maintenance  Topic Date Due  . TETANUS/TDAP  11/25/2026 (Originally 10/06/1960)  . INFLUENZA VACCINE  06/25/2017  . COLONOSCOPY  06/29/2024  . PNA vac Low Risk Adult  Completed      Plan:  I have personally reviewed and addressed the Medicare Annual Wellness questionnaire and have noted the following in the patient's chart:  A. Medical and social history B. Use of alcohol, tobacco or illicit drugs  C. Current medications and supplements D. Functional ability and status E.  Nutritional status F.  Physical activity G. Advance directives H. List of other physicians I.  Hospitalizations, surgeries, and ER visits in previous 12 months J.  [place] such as hearing and vision if needed, cognitive and depression L. Referrals and appointments - none  In addition, I have reviewed and discussed with patient certain preventive protocols, quality metrics, and best practice recommendations. A written personalized care plan for preventive services as well as general preventive health recommendations were provided to patient.  See attached scanned questionnaire for additional information.   Signed,  Fabio Neighbors, LPN Nurse Health Advisor   MD Recommendations: None. Pt declined  tetanus vaccine today.  I have reviewed the health advisor's note, was available for consultation, and agree with documentation and plan  Lelon Huh, MD

## 2017-02-27 DIAGNOSIS — I495 Sick sinus syndrome: Secondary | ICD-10-CM | POA: Diagnosis not present

## 2017-02-27 DIAGNOSIS — N189 Chronic kidney disease, unspecified: Secondary | ICD-10-CM | POA: Diagnosis not present

## 2017-02-28 ENCOUNTER — Other Ambulatory Visit: Payer: Self-pay | Admitting: Family Medicine

## 2017-03-11 DIAGNOSIS — I5022 Chronic systolic (congestive) heart failure: Secondary | ICD-10-CM | POA: Diagnosis not present

## 2017-03-11 DIAGNOSIS — I10 Essential (primary) hypertension: Secondary | ICD-10-CM | POA: Diagnosis not present

## 2017-03-11 DIAGNOSIS — I25810 Atherosclerosis of coronary artery bypass graft(s) without angina pectoris: Secondary | ICD-10-CM | POA: Diagnosis not present

## 2017-03-11 DIAGNOSIS — I255 Ischemic cardiomyopathy: Secondary | ICD-10-CM | POA: Diagnosis not present

## 2017-03-11 DIAGNOSIS — I4901 Ventricular fibrillation: Secondary | ICD-10-CM | POA: Diagnosis not present

## 2017-03-24 ENCOUNTER — Ambulatory Visit (INDEPENDENT_AMBULATORY_CARE_PROVIDER_SITE_OTHER): Payer: Medicare Other | Admitting: Family Medicine

## 2017-03-24 ENCOUNTER — Encounter: Payer: Self-pay | Admitting: Family Medicine

## 2017-03-24 VITALS — BP 140/70 | HR 55 | Temp 97.5°F | Resp 16 | Ht 70.0 in | Wt 224.0 lb

## 2017-03-24 DIAGNOSIS — J984 Other disorders of lung: Secondary | ICD-10-CM

## 2017-03-24 DIAGNOSIS — I2510 Atherosclerotic heart disease of native coronary artery without angina pectoris: Secondary | ICD-10-CM | POA: Diagnosis not present

## 2017-03-24 DIAGNOSIS — N183 Chronic kidney disease, stage 3 unspecified: Secondary | ICD-10-CM

## 2017-03-24 DIAGNOSIS — R7303 Prediabetes: Secondary | ICD-10-CM

## 2017-03-24 DIAGNOSIS — Z0000 Encounter for general adult medical examination without abnormal findings: Secondary | ICD-10-CM

## 2017-03-24 DIAGNOSIS — E7800 Pure hypercholesterolemia, unspecified: Secondary | ICD-10-CM | POA: Diagnosis not present

## 2017-03-24 DIAGNOSIS — I10 Essential (primary) hypertension: Secondary | ICD-10-CM

## 2017-03-24 DIAGNOSIS — Z125 Encounter for screening for malignant neoplasm of prostate: Secondary | ICD-10-CM | POA: Diagnosis not present

## 2017-03-24 DIAGNOSIS — I5020 Unspecified systolic (congestive) heart failure: Secondary | ICD-10-CM | POA: Diagnosis not present

## 2017-03-24 DIAGNOSIS — K635 Polyp of colon: Secondary | ICD-10-CM | POA: Insufficient documentation

## 2017-03-24 NOTE — Progress Notes (Signed)
Patient: Taylor [name] Male    DOB: [birth year]   76 y.o.   MRN: [id number] Visit Date: 03/24/2017  Today's Provider: Lelon Huh, MD   Chief Complaint  Patient presents with  . Follow-up  . Hypertension  . Hyperlipidemia  . Chronic Kidney Disease  . Congestive Heart Failure   Subjective:    HPI  Patient presents for annual exam and follow up chronic problems below. He feels well. No shortness of breath. chest pains, continue regular cardiology follow up with Dr. Fabio Asa. He was put on increased diruretics for a few weeks about a month ago for some swelling which has now gone back to baseline.  Last dermatology was at [facility] in June 2017.    Hypertension, follow-up:  BP Readings from Last 3 Encounters:  03/24/17 140/70  02/26/17 120/78  12/10/16 (!) 154/95    He was last seen for hypertension 12/20/2015.  BP at that visit was 120/80. Management since that visit includes; no changes.He reports good compliance with treatment. He is not having side effects. none He is exercising. He is adherent to low salt diet.   Outside blood pressures are 135/98. He is experiencing none.  Patient denies none.   Cardiovascular risk factors include none.  Use of agents associated with hypertension: none.   ----------------------------------------------------------------    Lipid/Cholesterol, Follow-up:   Last seen for this 05/12/2015.  Management since that visit includes;labs checked, no changes.  .  Last Lipid Panel:    Component Value Date/Time   CHOL 139 05/15/2015 0908   TRIG 142 05/15/2015 0908   HDL 28 (L) 05/15/2015 0908   CHOLHDL 5.0 05/15/2015 0908   LDLCALC 83 05/15/2015 0908    He reports good compliance with treatment. He is not having side effects. none  Wt Readings from Last 3 Encounters:  03/24/17 224 lb (101.6 kg)  02/26/17 221 lb (100.2 kg)  12/06/16 215 lb (97.5 kg)    ----------------------------------------------------------------     Systolic congestive heart failure, unspecified congestive heart failure chronicity From 05/12/15-labs checked, no changes.  Had follow up with Nehemiah Massed in 03/11/2017. Had met b at that time with Cr=1.4   Chronic kidney disease (CKD), stage III (moderate) Creaatinign 02/27/2017=.14 by cardiology.   Borderline diabetes Lab Results  Component Value Date   HGBA1C 5.9 (H) 05/15/2015      Allergies  Allergen Reactions  . Amlodipine Besylate Swelling    Had a reaction when taking with colcrys   . Crestor [Rosuvastatin]     Muscle cramps and pain  . Rocephin [Ceftriaxone]     unknown     Current Outpatient Prescriptions:  .  allopurinol (ZYLOPRIM) 300 MG tablet, TAKE 1 TABLET BY MOUTH EVERY DAY, Disp: 90 tablet, Rfl: 4 .  aspirin 81 MG EC tablet, Take 81 mg by mouth daily. , Disp: , Rfl:  .  atorvastatin (LIPITOR) 40 MG tablet, TAKE 1 TABLET BY MOUTH EVERY DAY, Disp: 30 tablet, Rfl: 6 .  carvedilol (COREG) 12.5 MG tablet, TAKE 1 TABLET BY MOUTH DAILY, Disp: 60 tablet, Rfl: 5 .  Cyanocobalamin (VITAMIN B-12 CR) 1000 MCG TBCR, Take 1,000 mcg by mouth daily. , Disp: , Rfl:  .  furosemide (LASIX) 20 MG tablet, Take 80 mg by mouth daily. , Disp: , Rfl: 5 .  GLUCOSAMINE-VITAMIN D PO, Take 1 tablet by mouth daily., Disp: , Rfl:  .  indomethacin (INDOCIN) 50 MG capsule, TAKE 1 CAPSULE BY MOUTH THREE TIMES DAILY AS NEEDED (Patient  taking differently: TAKE 1 CAPSULE BY MOUTH THREE TIMES DAILY AS NEEDED GOUT FLARE), Disp: 270 capsule, Rfl: 1 .  losartan (COZAAR) 50 MG tablet, TAKE 1 TABLET BY MOUTH DAILY, Disp: 30 tablet, Rfl: 12 .  pantoprazole (PROTONIX) 40 MG tablet, TAKE 1 TABLET BY MOUTH EVERY DAY, Disp: 30 tablet, Rfl: 12 .  triamcinolone ointment (KENALOG) 0.1 %, Apply 1 application topically 2 (two) times daily as needed (itching). , Disp: , Rfl:   Review of Systems  Constitutional: Negative for appetite change, chills and fever.  Eyes: Positive for photophobia.  Respiratory:  Negative for chest tightness, shortness of breath and wheezing.   Cardiovascular: Negative for chest pain and palpitations.  Gastrointestinal: Negative for abdominal pain, nausea and vomiting.  Neurological: Positive for light-headedness.  All other systems reviewed and are negative.   Social History  Substance Use Topics  . Smoking status: Former Smoker    Packs/day: 1.00    Types: Cigarettes    Quit date: 11/26/1999  . Smokeless tobacco: Never Used  . Alcohol use No   Objective:   BP 140/70 (BP Location: Right Arm, Patient Position: Sitting, Cuff Size: Large)   Pulse (!) 55   Temp 97.5 F (36.4 C) (Oral)   Resp 16   Ht '5\' 10"'  (1.778 m)   Wt 224 lb (101.6 kg)   SpO2 93%   BMI 32.14 kg/m  Vitals:   03/24/17 1017  BP: 140/70  Pulse: (!) 55  Resp: 16  Temp: 97.5 F (36.4 C)  TempSrc: Oral  SpO2: 93%  Weight: 224 lb (101.6 kg)  Height: '5\' 10"'  (1.778 m)     Physical Exam  General Appearance:    Alert, cooperative, no distress, obese  Eyes:    PERRL, conjunctiva/corneas clear, EOM's intact       Lungs:     Clear to auscultation bilaterally, respirations unlabored  Heart:    Regular rate and rhythm  Neurologic:   Awake, alert, oriented x 3. No apparent focal neurological           defect.           Assessment & Plan:     1. Annual physical exam Generally doing well.   2. Essential (primary) hypertension Well controlled.  Continue current medications.  Had met b checked about 3 weeks ago.   3. Systolic congestive heart failure, unspecified HF chronicity (HCC) Stable. Continue regular cardiology follow up  4. Arteriosclerosis of coronary artery Asymptomatic. Compliant with medication.  Continue aggressive risk factor modification.   - Lipid panel  5. Chronic kidney disease (CKD), stage III (moderate) Stable on recent met b done by cardiology.   6. Prediabetes  - Hemoglobin A1c  7. Hypercholesteremia He is tolerating atorvastatin well with no adverse  effects.   - Lipid panel  8. Prostate cancer screening  - PSA       Lelon Huh, MD  [facility]

## 2017-03-25 LAB — LIPID PANEL
CHOLESTEROL TOTAL: 104 mg/dL (ref 100–199)
Chol/HDL Ratio: 3.9 ratio (ref 0.0–5.0)
HDL: 27 mg/dL — AB (ref >39)
LDL CALC: 43 mg/dL (ref 0–99)
TRIGLYCERIDES: 170 mg/dL — AB (ref 0–149)
VLDL CHOLESTEROL CAL: 34 mg/dL (ref 5–40)

## 2017-03-25 LAB — HEMOGLOBIN A1C
Est. average glucose Bld gHb Est-mCnc: 137 mg/dL
Hgb A1c MFr Bld: 6.4 % — ABNORMAL HIGH (ref 4.8–5.6)

## 2017-03-25 LAB — PSA: Prostate Specific Ag, Serum: 2.9 ng/mL (ref 0.0–4.0)

## 2017-07-10 DIAGNOSIS — E782 Mixed hyperlipidemia: Secondary | ICD-10-CM | POA: Diagnosis not present

## 2017-07-10 DIAGNOSIS — G4733 Obstructive sleep apnea (adult) (pediatric): Secondary | ICD-10-CM | POA: Diagnosis not present

## 2017-07-10 DIAGNOSIS — I25810 Atherosclerosis of coronary artery bypass graft(s) without angina pectoris: Secondary | ICD-10-CM | POA: Diagnosis not present

## 2017-07-10 DIAGNOSIS — I10 Essential (primary) hypertension: Secondary | ICD-10-CM | POA: Diagnosis not present

## 2017-07-10 DIAGNOSIS — I5022 Chronic systolic (congestive) heart failure: Secondary | ICD-10-CM | POA: Diagnosis not present

## 2017-07-18 DIAGNOSIS — M3501 Sicca syndrome with keratoconjunctivitis: Secondary | ICD-10-CM | POA: Diagnosis not present

## 2017-07-25 ENCOUNTER — Other Ambulatory Visit: Payer: Self-pay | Admitting: Family Medicine

## 2017-08-25 ENCOUNTER — Other Ambulatory Visit: Payer: Self-pay | Admitting: Family Medicine

## 2017-08-25 NOTE — Telephone Encounter (Signed)
Pharmacy requesting refills. Thanks!  

## 2017-10-03 ENCOUNTER — Other Ambulatory Visit: Payer: Self-pay | Admitting: Family Medicine

## 2017-11-27 ENCOUNTER — Other Ambulatory Visit: Payer: Self-pay | Admitting: Family Medicine

## 2017-12-10 DIAGNOSIS — G4730 Sleep apnea, unspecified: Secondary | ICD-10-CM | POA: Diagnosis not present

## 2018-01-19 DIAGNOSIS — H43813 Vitreous degeneration, bilateral: Secondary | ICD-10-CM | POA: Diagnosis not present

## 2018-02-02 ENCOUNTER — Other Ambulatory Visit: Payer: Self-pay | Admitting: Family Medicine

## 2018-02-06 ENCOUNTER — Telehealth: Payer: Self-pay

## 2018-02-06 NOTE — Telephone Encounter (Signed)
Patient scheduled for 03/27/2018 at 9:20am.

## 2018-02-06 NOTE — Telephone Encounter (Signed)
LMTCB and schedule AWV. -MM

## 2018-03-27 ENCOUNTER — Encounter: Payer: Self-pay | Admitting: Family Medicine

## 2018-03-27 ENCOUNTER — Ambulatory Visit: Payer: Self-pay

## 2018-03-31 ENCOUNTER — Ambulatory Visit (INDEPENDENT_AMBULATORY_CARE_PROVIDER_SITE_OTHER): Payer: Medicare Other

## 2018-03-31 ENCOUNTER — Encounter: Payer: Self-pay | Admitting: Family Medicine

## 2018-03-31 ENCOUNTER — Ambulatory Visit (INDEPENDENT_AMBULATORY_CARE_PROVIDER_SITE_OTHER): Payer: Medicare Other | Admitting: Family Medicine

## 2018-03-31 VITALS — BP 134/68 | HR 64 | Temp 97.5°F | Ht 70.0 in | Wt 220.8 lb

## 2018-03-31 DIAGNOSIS — Z0000 Encounter for general adult medical examination without abnormal findings: Secondary | ICD-10-CM

## 2018-03-31 DIAGNOSIS — I10 Essential (primary) hypertension: Secondary | ICD-10-CM | POA: Diagnosis not present

## 2018-03-31 DIAGNOSIS — I2510 Atherosclerotic heart disease of native coronary artery without angina pectoris: Secondary | ICD-10-CM | POA: Diagnosis not present

## 2018-03-31 DIAGNOSIS — N183 Chronic kidney disease, stage 3 unspecified: Secondary | ICD-10-CM

## 2018-03-31 DIAGNOSIS — R059 Cough, unspecified: Secondary | ICD-10-CM

## 2018-03-31 DIAGNOSIS — Z8551 Personal history of malignant neoplasm of bladder: Secondary | ICD-10-CM

## 2018-03-31 DIAGNOSIS — G4733 Obstructive sleep apnea (adult) (pediatric): Secondary | ICD-10-CM | POA: Diagnosis not present

## 2018-03-31 DIAGNOSIS — E7800 Pure hypercholesterolemia, unspecified: Secondary | ICD-10-CM | POA: Diagnosis not present

## 2018-03-31 DIAGNOSIS — R7303 Prediabetes: Secondary | ICD-10-CM | POA: Diagnosis not present

## 2018-03-31 DIAGNOSIS — J069 Acute upper respiratory infection, unspecified: Secondary | ICD-10-CM

## 2018-03-31 DIAGNOSIS — R05 Cough: Secondary | ICD-10-CM | POA: Diagnosis not present

## 2018-03-31 DIAGNOSIS — Z125 Encounter for screening for malignant neoplasm of prostate: Secondary | ICD-10-CM | POA: Diagnosis not present

## 2018-03-31 DIAGNOSIS — L4050 Arthropathic psoriasis, unspecified: Secondary | ICD-10-CM | POA: Diagnosis not present

## 2018-03-31 LAB — POCT URINALYSIS DIPSTICK
BILIRUBIN UA: NEGATIVE
Glucose, UA: NEGATIVE
Ketones, UA: NEGATIVE
LEUKOCYTES UA: NEGATIVE
NITRITE UA: NEGATIVE
PH UA: 6 (ref 5.0–8.0)
PROTEIN UA: NEGATIVE
RBC UA: NEGATIVE
Spec Grav, UA: 1.015 (ref 1.010–1.025)
UROBILINOGEN UA: 0.2 U/dL

## 2018-03-31 MED ORDER — TRIAMCINOLONE ACETONIDE 0.1 % EX OINT: 1.0000 "application " | TOPICAL_OINTMENT | Freq: Two times a day (BID) | CUTANEOUS | 3 refills | Status: DC | PRN

## 2018-03-31 NOTE — Progress Notes (Signed)
Patient: Taylor [name], Male    DOB: 09-23-1941, 77 y.o.   MRN: [id number] Visit Date: 03/31/2018  Today's Provider: Lelon Huh, MD   Chief Complaint  Patient presents with  . Annual Exam  . Hypertension  . Congestive Heart Failure  . Hyperglycemia  . Hyperlipidemia   Subjective:   Patient saw Taylor [name] for AWV today at 2:00 pm.   Complete Physical Taylor [name] is a 77 y.o. male. He feels poorly (has cold symptoms). He reports exercising daily. He reports he is sleeping fairly well.  -----------------------------------------------------------   Hypertension, follow-up:  BP Readings from Last 3 Encounters:  03/24/17 140/70  02/26/17 120/78  12/10/16 (!) 154/95    He was last seen for hypertension 1 years ago.  BP at that visit was 140/70. Management since that visit includes; no changes.He reports good compliance with treatment. He is not having side effects.  He is exercising. He is adherent to low salt diet.   Outside blood pressures are checked at home and average 150/80. He is experiencing none.  Patient denies chest pain, chest pressure/discomfort, claudication, dyspnea, exertional chest pressure/discomfort, fatigue, irregular heart beat, lower extremity edema, near-syncope, orthopnea, palpitations, paroxysmal nocturnal dyspnea, syncope and tachypnea.   Cardiovascular risk factors include advanced age (older than 25 for men, 25 for women), hypertension and male gender.  Use of agents associated with hypertension: NSAIDS.   ------------------------------------------------------------------------    Lipid/Cholesterol, Follow-up:   Last seen for this 1 years ago.  Management since that visit includes; labs checked, no changes.  Last Lipid Panel:    Component Value Date/Time   CHOL 104 03/24/2017 1110   TRIG 170 (H) 03/24/2017 1110   HDL 27 (L) 03/24/2017 1110   CHOLHDL 3.9 03/24/2017 1110   LDLCALC 43 03/24/2017 1110    He reports good  compliance with treatment. He is not having side effects.   Wt Readings from Last 3 Encounters:  03/24/17 224 lb (101.6 kg)  02/26/17 221 lb (100.2 kg)  12/06/16 215 lb (97.5 kg)    ------------------------------------------------------------------------  Systolic congestive heart failure, unspecified HF chronicity ([place]) From 03/24/2017-no changes. Stable. Continue regular cardiology follow up. Patient reports good compliance with treatment.  Arteriosclerosis of coronary artery From 03/24/2017-labs checked, no changes. Continue aggressive risk factor modification.   Patient reports good compliance with treatment.   Chronic kidney disease (CKD), stage III (moderate) From 03/24/2017-no changes.  Patient reports good compliance with treatment.   Prediabetes From 03/24/2017-labs checked, no changes. Hemoglobin A1c 6.4.Patient reports he has not been following a low glycemic index diet.  Lab Results  Component Value Date   HGBA1C 6.4 (H) 03/24/2017    He has CPAP for OSA and reports he uses it every night, is well tolerated and effective  He has psoriatic arthritis and reports joints are stable. Triamcinolone works well for skin conditions and requests refill today.   Cough: Patient comes in today complaining of a productive cough that started 2 days ago. Associated symptoms includes chills. Patient has tried Nyquil for cough with no relief.   Review of Systems  Constitutional: Positive for chills. Negative for appetite change, fatigue and fever.  HENT: Positive for hearing loss. Negative for congestion, ear pain, nosebleeds and trouble swallowing.   Eyes: Positive for visual disturbance. Negative for pain.  Respiratory: Positive for cough (productive cough with yellow phlegm x 2 days ). Negative for chest tightness and shortness of breath.   Cardiovascular: Negative for chest pain, palpitations  and leg swelling.  Gastrointestinal: Negative for abdominal pain, blood in stool,  constipation, diarrhea, nausea and vomiting.  Endocrine: Negative for polydipsia, polyphagia and polyuria.  Genitourinary: Negative for dysuria and flank pain.  Musculoskeletal: Negative for arthralgias, back pain, joint swelling, myalgias and neck stiffness.  Skin: Negative for color change, rash and wound.  Neurological: Negative for dizziness, tremors, seizures, speech difficulty, weakness, light-headedness and headaches.  Psychiatric/Behavioral: Negative for behavioral problems, confusion, decreased concentration, dysphoric mood and sleep disturbance. The patient is not nervous/anxious.   All other systems reviewed and are negative.   Social History   Socioeconomic History  . Marital status: Married    Spouse name: Not on file  . Number of children: 3  . Years of education: Not on file  . Highest education level: 12th grade  Occupational History  . 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 Financial resource strain: Not hard at all  . Food insecurity:    Worry: Never true    Inability: Never true  . Transportation needs:    Medical: No    Non-medical: No  Tobacco Use  . Smoking status: Former Smoker    Packs/day: 1.00    Types: Cigarettes    Last attempt to quit: 11/26/1999    Years since quitting: 18.3  . Smokeless tobacco: Never Used  Substance and Sexual Activity  . Alcohol use: No  . Drug use: No  . Sexual activity: Not on file  Lifestyle  . Physical activity:    Days per week: Not on file    Minutes per session: Not on file  . Stress: Not at all  Relationships  . Social connections:    Talks on phone: Not on file    Gets together: Not on file    Attends religious service: Not on file    Active member of club or organization: Not on file    Attends meetings of clubs or organizations: Not on file    Relationship status: Not on file  . Intimate partner violence:    Fear of current or ex partner: Not on file     Emotionally abused: Not on file    Physically abused: Not on file    Forced sexual activity: Not on file  Other Topics Concern  . Not on file  Social History Narrative  . Not on file    Past Medical History:  Diagnosis Date  . AAA (abdominal aortic aneurysm) [facility]) 06/03/2007   [facility]; Dr. Kellie Simmering  . AICD (automatic cardioverter/defibrillator) present   . Barrett's esophagus   . Bladder cancer ([place])   . Bradycardia   . CAD (coronary artery disease)   . CHF (congestive heart failure) ([place])   . Cluster headache   . DDD (degenerative disc disease), lumbar   . Dyspnea    WITH EXERTION  . Edema    LEFT ANKLE  . Fracture of skull base ([place]) 1997   due to fall  . GERD (gastroesophageal reflux disease)   . Gout   . History of bladder cancer 12/1995  . Hyperlipidemia   . Hypertension   . Malignant melanoma ([place]) 12/2012   right dorsal forearm excised  . Myocardial infarction ([place])    LAST 2014  . Osteoarthritis of knee   . Pacemaker 10/10/2006  . Pneumonia    2016  . Pre-diabetes   . Psoriasis   . Rib fracture 1997   due to  fall  . Sleep apnea    CPAP  . Stroke ([place])   . Venous incompetence      Patient Active Problem List   Diagnosis Date Noted  . Benign colonic polyp 03/24/2017  . Tibialis anterior tenosynovitis 12/21/2015  . Pleural nodule 07/27/2015  . Psoriatic arthritis ([place]) 05/12/2015  . Restrictive lung disease 05/12/2015  . Barrett esophagus 03/30/2015  . Ache in joint 03/30/2015  . Bradycardia 03/30/2015  . Arteriosclerosis of coronary artery 03/30/2015  . Cardiac defibrillator in place 03/30/2015  . Chronic kidney disease (CKD), stage III (moderate) ([place]) 03/30/2015  . Cluster headache syndrome 03/30/2015  . Degeneration of lumbar or lumbosacral intervertebral disc 03/30/2015  . Gout 03/30/2015  . External hemorrhoid, thrombosed 03/30/2015  . Personal history of malignant neoplasm of bladder 03/30/2015  . Hypercholesteremia  03/30/2015  . LBP (low back pain) 03/30/2015  . Malignant melanoma ([place]) 03/30/2015  . Muscle ache 03/30/2015  . Arthritis of knee, degenerative 03/30/2015  . Prediabetes 03/30/2015  . Psoriasis 03/30/2015  . Obstructive sleep apnea 03/30/2015  . Chronic venous insufficiency 03/30/2015  . AAA (abdominal aortic aneurysm) ([place]) 06/24/2013  . Cerebral infarct ([place]) 06/24/2013  . Cardiomyopathy, ischemic 06/24/2013  . Essential (primary) hypertension 08/17/2011  . Arrhythmia, sinus node 08/17/2011  . Ventricular fibrillation ([place]) 08/17/2011  . Congestive heart failure ([place]) 02/26/2010  . 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 Cardiac pacemaker in situ 10/10/2006    Past Surgical History:  Procedure Laterality Date  . ABDOMINAL AORTIC ANEURYSM REPAIR  06/03/2007   [facility]; Dr. Kellie Simmering  . ANGIOPLASTY  1994   MI  . BLADDER TUMOR EXCISION  12/1995  . CATARACT EXTRACTION W/PHACO Left 10/22/2016   Procedure: CATARACT EXTRACTION PHACO AND INTRAOCULAR LENS PLACEMENT (IOC);  Surgeon: Birder Robson, MD;  Location: [facility] ORS;  Service: Ophthalmology;  Laterality: Left;  [place] 47.7 AP% 18.4 CDE 8.78 Fluid pack lot # [id number]  . CATARACT EXTRACTION W/PHACO Right 12/10/2016   Procedure: CATARACT EXTRACTION PHACO AND INTRAOCULAR LENS PLACEMENT (IOC);  Surgeon: Birder Robson, MD;  Location: [facility] ORS;  Service: Ophthalmology;  Laterality: Right;  [place] 00:39 AP% 23.3 CDE 9.13 Fluid pack lot # [id number]  . CORONARY ANGIOPLASTY     STENTS X 5  . CORONARY ARTERY BYPASS GRAFT  09/22/2006   four  . ELBOW BURSA SURGERY     DUE TO GOUT  . INSERT / REPLACE / REMOVE PACEMAKER    . MELANOMA EXCISION  12/2012   Right forearm    His family history includes Arthritis in his brother; Cancer in his father and mother; Heart attack (age of onset: 54) in his father.      Current Outpatient Medications:  .  allopurinol (ZYLOPRIM) 300 MG tablet, TAKE 1  TABLET BY MOUTH EVERY DAY, Disp: 90 tablet, Rfl: 2 .  aspirin 81 MG EC tablet, Take 81 mg by mouth daily. , Disp: , Rfl:  .  atorvastatin (LIPITOR) 40 MG tablet, TAKE 1 TABLET BY MOUTH EVERY DAY, Disp: 90 tablet, Rfl: 2 .  carvedilol (COREG) 12.5 MG tablet, TAKE 1 TABLET BY MOUTH DAILY, Disp: 60 tablet, Rfl: 5 .  Cyanocobalamin (VITAMIN B-12 CR) 1000 MCG TBCR, Take 1,000 mcg by mouth daily. , Disp: , Rfl:  .  furosemide (LASIX) 20 MG tablet, TAKE 2-4 TABLETS BY MOUTH DAILY., Disp: 120 tablet, Rfl: 5 .  GLUCOSAMINE-VITAMIN D PO, Take 1 tablet by mouth daily., Disp: , Rfl:  .  indomethacin (INDOCIN)  50 MG capsule, TAKE 1 CAPSULE BY MOUTH THREE TIMES DAILY AS NEEDED, Disp: 270 capsule, Rfl: 1 .  losartan (COZAAR) 50 MG tablet, TAKE 1 TABLET BY MOUTH DAILY, Disp: 90 tablet, Rfl: 4 .  pantoprazole (PROTONIX) 40 MG tablet, TAKE 1 TABLET BY MOUTH EVERY DAY, Disp: 90 tablet, Rfl: 4 .  triamcinolone ointment (KENALOG) 0.1 %, Apply 1 application topically 2 (two) times daily as needed (itching). , Disp: , Rfl:   Patient Care Team: Birdie Sons, MD as PCP - General (Family Medicine) Corey Skains, MD as Consulting Physician (Internal Medicine) Birder Robson, MD as Referring Physician (Ophthalmology)     Objective:   Vitals:  Vitals:   BP 134/68 (BP Location: Right Arm)    Pulse 64    Temp 97.5 F (36.4 C) (Oral)    Ht 5\' 10"  (1.778 m)    Wt 220 lb 12.8 oz (100.2 kg)    BMI 31.68 kg/m    BSA 2.22 m          Physical Exam   General Appearance:    Alert, cooperative, no distress, appears stated age, overweight  Head:    Normocephalic, without obvious abnormality, atraumatic  Eyes:    PERRL, conjunctiva/corneas clear, EOM's intact, fundi    benign, both eyes       Ears:    Normal TM's and external ear canals, both ears  Nose:   Nares normal, septum midline, mucosa normal, no drainage   or sinus tenderness  Throat:   Lips, mucosa, and tongue normal; teeth and gums  normal  Neck:   Supple, symmetrical, trachea midline, no adenopathy;       thyroid:  No enlargement/tenderness/nodules; no carotid   bruit or JVD  Back:     Symmetric, no curvature, ROM normal, no CVA tenderness  Lungs:     Clear to auscultation bilaterally, respirations unlabored  Chest wall:    No tenderness or deformity  Heart:    Regular rate and rhythm, S1 and S2 normal, no murmur, rub   or gallop  Abdomen:     Soft, non-tender, bowel sounds active all four quadrants,    no masses, no organomegaly  Genitalia:    deferred  Rectal:    deferred  Extremities:   Extremities normal, atraumatic, no cyanosis or edema  Pulses:   2+ and symmetric all extremities  Skin:   Wide area of psoriatic skin changes. No suspicious pigmented lesion on extremities, face or torso.   Lymph nodes:   Cervical, supraclavicular, and axillary nodes normal  Neurologic:   CNII-XII intact. Normal strength, sensation and reflexes      throughout    Activities of Daily Living In your present state of health, do you have any difficulty performing the following activities: 03/31/2018  Hearing? Y  Comment Hearing loss in left ear due to previous fall.  Vision? Y  Comment No peripheral vision in both eyes.   Difficulty concentrating or making decisions? N  Walking or climbing stairs? Y  Comment Due to being fatigued.   Dressing or bathing? N  Doing errands, shopping? N  Preparing Food and eating ? N  Using the Toilet? N  In the past six months, have you accidently leaked urine? N  Do you have problems with loss of bowel control? N  Managing your Medications? N  Managing your Finances? N  Housekeeping or managing your Housekeeping? N  Some recent data might be hidden    Fall Risk Assessment  Fall Risk  03/31/2018 02/26/2017 05/12/2015  Falls in the past year? No No No  Risk for fall due to : - - History of fall(s)     Depression Screen PHQ 2/9 Scores 03/31/2018 03/31/2018 02/26/2017 02/26/2017  PHQ - 2 Score 1 1 0 0   PHQ- 9 Score 7 - 4 -      Assessment & Plan:    Annual Physical Reviewed patient's Family Medical History Reviewed and updated list of patient's medical providers Assessment of cognitive impairment was done Assessed patient's functional ability Established a written schedule for health screening [place] Completed and Reviewed  Exercise Activities and Dietary recommendations Goals    None      Immunization History  Administered Date(s) Administered  . Influenza Split 09/18/2011  . Influenza, High Dose Seasonal PF 08/16/2014, 10/09/2016  . Influenza,inj,Quad PF,6+ Mos 12/20/2015  . Influenza-Unspecified 10/09/2017  . Pneumococcal Conjugate-13 05/09/2014  . Pneumococcal Polysaccharide-23 05/12/2015    Health Maintenance  Topic Date Due  . TETANUS/TDAP  11/25/2026 (Originally 10/06/1960)  . INFLUENZA VACCINE  06/25/2018  . COLONOSCOPY  06/29/2024  . PNA vac Low Risk Adult  Completed     Discussed health benefits of physical activity, and encouraged him to engage in regular exercise appropriate for his age and condition.    ------------------------------------------------------------------------------------------------------------  1. Annual physical exam   2. Upper respiratory tract infection, unspecified type Counseled regarding signs and symptoms of viral and bacterial respiratory infections. Advised him that he can start prescription for antibiotic if he develops any sign of bacterial infection, or if current symptoms last longer than 10 days.   3. Cough   4. Arteriosclerosis of coronary artery Asymptomatic. Compliant with medication.  Continue aggressive risk factor modification.  Continue routine follow up with cardiology.   5. Obstructive sleep apnea Using CPAP consistently   6. Psoriatic arthritis ([place]) Well controlled.  Refilled triamcinolone.   7. Hypercholesteremia He is tolerating atorvastatin well with no adverse effects.    - Comprehensive metabolic panel - Lipid panel  8. Prediabetes  - Hemoglobin A1c  9. Chronic kidney disease (CKD), stage III (moderate) ([facility])  - Comprehensive metabolic panel  10. Prostate cancer screening  - PSA  11. History of bladder cancer  - POCT Urinalysis Dipstick  12. Essential (primary) hypertension Well controlled.  Continue current medications.      Lelon Huh, MD  [facility]

## 2018-03-31 NOTE — Patient Instructions (Addendum)
Taylor [name] , Thank you for taking time to come for your Medicare Wellness Visit. I appreciate your ongoing commitment to your health goals. Please review the following plan we discussed and let me know if I can assist you in the future.   Screening recommendations/referrals: Colonoscopy: Up to date Recommended yearly ophthalmology/optometry visit for glaucoma screening and checkup Recommended yearly dental visit for hygiene and checkup  Vaccinations: Influenza vaccine: Up to date Pneumococcal vaccine: Up to date Tdap vaccine: Pt declines today.  Shingles vaccine: Pt declines today.     Advanced directives: Advance directive discussed with you today. Even though you declined this today please call our office should you change your mind and we can give you the proper paperwork for you to fill out.  Conditions/risks identified: Obesity- recommend decreasing portion sizes by half and eating 3 small meals a day with two healthy snacks in between.    Next appointment: 3:00 PM today with Dr [name] Section.   Preventive Care 12 Years and Older, Male Preventive care refers to lifestyle choices and visits with your health care provider that can promote health and wellness. What does preventive care include?  A yearly physical exam. This is also called an annual well check.  Dental exams once or twice a year.  Routine eye exams. Ask your health care provider how often you should have your eyes checked.  Personal lifestyle choices, including:  Daily care of your teeth and gums.  Regular physical activity.  Eating a healthy diet.  Avoiding tobacco and drug use.  Limiting alcohol use.  Practicing safe sex.  Taking low doses of aspirin every day.  Taking vitamin and mineral supplements as recommended by your health care provider. What happens during an annual well check? The services and screenings done by your health care provider during your annual well check will depend on your age, overall  health, lifestyle risk factors, and family history of disease. Counseling  Your health care provider may ask you questions about your:  Alcohol use.  Tobacco use.  Drug use.  Emotional well-being.  Home and relationship well-being.  Sexual activity.  Eating habits.  History of falls.  Memory and ability to understand (cognition).  Work and work Statistician. Screening  You may have the following tests or measurements:  Height, weight, and BMI.  Blood pressure.  Lipid and cholesterol levels. These may be checked every 5 years, or more frequently if you are over 75 years old.  Skin check.  Lung cancer screening. You may have this screening every year starting at age 52 if you have a 30-pack-year history of smoking and currently smoke or have quit within the past 15 years.  Fecal occult blood test (FOBT) of the stool. You may have this test every year starting at age 51.  Flexible sigmoidoscopy or colonoscopy. You may have a sigmoidoscopy every 5 years or a colonoscopy every 10 years starting at age 35.  Prostate cancer screening. Recommendations will vary depending on your family history and other risks.  Hepatitis C blood test.  Hepatitis B blood test.  Sexually transmitted disease (STD) testing.  Diabetes screening. This is done by checking your blood sugar (glucose) after you have not eaten for a while (fasting). You may have this done every 1-3 years.  Abdominal aortic aneurysm (AAA) screening. You may need this if you are a current or former smoker.  Osteoporosis. You may be screened starting at age 21 if you are at high risk. Talk with your health  care provider about your test results, treatment options, and if necessary, the need for more tests. Vaccines  Your health care provider may recommend certain vaccines, such as:  Influenza vaccine. This is recommended every year.  Tetanus, diphtheria, and acellular pertussis (Tdap, Td) vaccine. You may need a Td  booster every 10 years.  Zoster vaccine. You may need this after age 54.  Pneumococcal 13-valent conjugate (PCV13) vaccine. One dose is recommended after age 67.  Pneumococcal polysaccharide (PPSV23) vaccine. One dose is recommended after age 31. Talk to your health care provider about which screenings and vaccines you need and how often you need them. This information is not intended to replace advice given to you by your health care provider. Make sure you discuss any questions you have with your health care provider. Document Released: 12/08/2015 Document Revised: 07/31/2016 Document Reviewed: 09/12/2015 Elsevier Interactive Patient Education  2017 [place] Prevention in the Home Falls can cause injuries. They can happen to people of all ages. There are many things you can do to make your home safe and to help prevent falls. What can I do on the outside of my home?  Regularly fix the edges of walkways and driveways and fix any cracks.  Remove anything that might make you trip as you walk through a door, such as a raised step or threshold.  Trim any bushes or trees on the path to your home.  Use bright outdoor lighting.  Clear any walking paths of anything that might make someone trip, such as rocks or tools.  Regularly check to see if handrails are loose or broken. Make sure that both sides of any steps have handrails.  Any raised decks and porches should have guardrails on the edges.  Have any leaves, snow, or ice cleared regularly.  Use sand or salt on walking paths during winter.  Clean up any spills in your garage right away. This includes oil or grease spills. What can I do in the bathroom?  Use night lights.  Install grab bars by the toilet and in the tub and shower. Do not use towel bars as grab bars.  Use non-skid mats or decals in the tub or shower.  If you need to sit down in the shower, use a plastic, non-slip stool.  Keep the floor dry. Clean up  any water that spills on the floor as soon as it happens.  Remove soap buildup in the tub or shower regularly.  Attach bath mats securely with double-sided non-slip rug tape.  Do not have throw rugs and other things on the floor that can make you trip. What can I do in the bedroom?  Use night lights.  Make sure that you have a light by your bed that is easy to reach.  Do not use any sheets or blankets that are too big for your bed. They should not hang down onto the floor.  Have a firm chair that has side arms. You can use this for support while you get dressed.  Do not have throw rugs and other things on the floor that can make you trip. What can I do in the kitchen?  Clean up any spills right away.  Avoid walking on wet floors.  Keep items that you use a lot in easy-to-reach places.  If you need to reach something above you, use a strong step stool that has a grab bar.  Keep electrical cords out of the way.  Do not use floor  polish or wax that makes floors slippery. If you must use wax, use non-skid floor wax.  Do not have throw rugs and other things on the floor that can make you trip. What can I do with my stairs?  Do not leave any items on the stairs.  Make sure that there are handrails on both sides of the stairs and use them. Fix handrails that are broken or loose. Make sure that handrails are as long as the stairways.  Check any carpeting to make sure that it is firmly attached to the stairs. Fix any carpet that is loose or worn.  Avoid having throw rugs at the top or bottom of the stairs. If you do have throw rugs, attach them to the floor with carpet tape.  Make sure that you have a light switch at the top of the stairs and the bottom of the stairs. If you do not have them, ask someone to add them for you. What else can I do to help prevent falls?  Wear shoes that:  Do not have high heels.  Have rubber bottoms.  Are comfortable and fit you well.  Are  closed at the toe. Do not wear sandals.  If you use a stepladder:  Make sure that it is fully opened. Do not climb a closed stepladder.  Make sure that both sides of the stepladder are locked into place.  Ask someone to hold it for you, if possible.  Clearly mark and make sure that you can see:  Any grab bars or handrails.  First and last steps.  Where the edge of each step is.  Use tools that help you move around (mobility aids) if they are needed. These include:  Canes.  Walkers.  Scooters.  Crutches.  Turn on the lights when you go into a dark area. Replace any light bulbs as soon as they burn out.  Set up your furniture so you have a clear path. Avoid moving your furniture around.  If any of your floors are uneven, fix them.  If there are any pets around you, be aware of where they are.  Review your medicines with your doctor. Some medicines can make you feel dizzy. This can increase your chance of falling. Ask your doctor what other things that you can do to help prevent falls. This information is not intended to replace advice given to you by your health care provider. Make sure you discuss any questions you have with your health care provider. Document Released: 09/07/2009 Document Revised: 04/18/2016 Document Reviewed: 12/16/2014 Elsevier Interactive Patient Education  2017 [employer].

## 2018-03-31 NOTE — Progress Notes (Signed)
Subjective:   Taylor [name] is a 77 y.o. male who presents for Medicare Annual/Subsequent preventive examination.  Review of Systems:  N/A Cardiac Risk Factors include: advanced age (>43men, >69 women);hypertension;male gender;obesity (BMI >30kg/m2)     Objective:    Vitals: BP 134/68 (BP Location: Right Arm)   Pulse 64   Temp (!) 97.5 F (36.4 C) (Oral)   Ht 5\' 10"  (1.778 m)   Wt 220 lb 12.8 oz (100.2 kg)   BMI 31.68 kg/m   Body mass index is 31.68 kg/m.  Advanced Directives 03/31/2018 02/26/2017 10/22/2016 07/07/2015  Does Patient Have a Medical Advance Directive? No Yes Yes Yes  Type of Advance Directive - Living will Living will Living will  Would patient like information on creating a medical advance directive? No - Patient declined - - -    Tobacco Social History   Tobacco Use  Smoking Status Former Smoker  . Packs/day: 1.00  . Types: Cigarettes  . Last attempt to quit: 11/26/1999  . Years since quitting: 18.3  Smokeless Tobacco Never Used     Counseling given: Not Answered   Clinical Intake:  Pre-visit preparation completed: Yes  Pain : No/denies pain Pain Score: 0-No pain     Nutritional Status: BMI > 30  Obese Nutritional Risks: None Diabetes: No  How often do you need to have someone help you when you read instructions, pamphlets, or other written materials from your doctor or pharmacy?: 1 - Never  Interpreter Needed?: No  Information entered by :: [facility], LPN  Past Medical History:  Diagnosis Date  . AAA (abdominal aortic aneurysm) [facility]) 06/03/2007   [facility]; Dr. Kellie Simmering  . AICD (automatic cardioverter/defibrillator) present   . Barrett's esophagus   . Bladder cancer ([place])   . Bradycardia   . CAD (coronary artery disease)   . CHF (congestive heart failure) ([place])   . Cluster headache   . DDD (degenerative disc disease), lumbar   . Dyspnea    WITH EXERTION  . Edema    LEFT ANKLE  . Fracture of skull base ([place])  1997   due to fall  . GERD (gastroesophageal reflux disease)   . Gout   . History of bladder cancer 12/1995  . Hyperlipidemia   . Hypertension   . Malignant melanoma ([place]) 12/2012   right dorsal forearm excised  . Myocardial infarction ([place])    LAST 2014  . Osteoarthritis of knee   . Pacemaker 10/10/2006  . Pneumonia    2016  . Pre-diabetes   . Psoriasis   . Rib fracture 1997   due to fall  . Sleep apnea    CPAP  . Stroke ([place])   . Venous incompetence    Past Surgical History:  Procedure Laterality Date  . ABDOMINAL AORTIC ANEURYSM REPAIR  06/03/2007   [facility]; Dr. Kellie Simmering  . ANGIOPLASTY  1994   MI  . BLADDER TUMOR EXCISION  12/1995  . CATARACT EXTRACTION W/PHACO Left 10/22/2016   Procedure: CATARACT EXTRACTION PHACO AND INTRAOCULAR LENS PLACEMENT (IOC);  Surgeon: Birder Robson, MD;  Location: [facility] ORS;  Service: Ophthalmology;  Laterality: Left;  [place] 47.7 AP% 18.4 CDE 8.78 Fluid pack lot # [id number]  . CATARACT EXTRACTION W/PHACO Right 12/10/2016   Procedure: CATARACT EXTRACTION PHACO AND INTRAOCULAR LENS PLACEMENT (IOC);  Surgeon: Birder Robson, MD;  Location: [facility] ORS;  Service: Ophthalmology;  Laterality: Right;  [place] 00:39 AP% 23.3 CDE 9.13 Fluid pack lot # [id number]  .  CORONARY ANGIOPLASTY     STENTS X 5  . CORONARY ARTERY BYPASS GRAFT  09/22/2006   four  . ELBOW BURSA SURGERY     DUE TO GOUT  . INSERT / REPLACE / REMOVE PACEMAKER    . MELANOMA EXCISION  12/2012   Right forearm   Family History  Problem Relation Age of Onset  . Cancer Mother        Melanoma skin cancer  . Heart attack Father 9  . Cancer Father        throat cancer  . Arthritis Brother    Social History   Socioeconomic History  . Marital status: Married    Spouse name: Not on file  . Number of children: 3  . Years of education: Not on file  . Highest education level: 12th grade  Occupational History  . Occupation: retired    Comment: previously worked as  a Event organiser  . Financial resource strain: Not hard at all  . Food insecurity:    Worry: Never true    Inability: Never true  . Transportation needs:    Medical: No    Non-medical: No  Tobacco Use  . Smoking status: Former Smoker    Packs/day: 1.00    Types: Cigarettes    Last attempt to quit: 11/26/1999    Years since quitting: 18.3  . Smokeless tobacco: Never Used  Substance and Sexual Activity  . Alcohol use: No  . Drug use: No  . Sexual activity: Not on file  Lifestyle  . Physical activity:    Days per week: Not on file    Minutes per session: Not on file  . Stress: Not at all  Relationships  . Social connections:    Talks on phone: Not on file    Gets together: Not on file    Attends religious service: Not on file    Active member of club or organization: Not on file    Attends meetings of clubs or organizations: Not on file    Relationship status: Not on file  Other Topics Concern  . Not on file  Social History Narrative  . Not on file    Outpatient Encounter Medications as of 03/31/2018  Medication Sig  . allopurinol (ZYLOPRIM) 300 MG tablet TAKE 1 TABLET BY MOUTH EVERY DAY  . aspirin 81 MG EC tablet Take 81 mg by mouth daily.   Marland Kitchen atorvastatin (LIPITOR) 40 MG tablet TAKE 1 TABLET BY MOUTH EVERY DAY  . carvedilol (COREG) 12.5 MG tablet TAKE 1 TABLET BY MOUTH DAILY  . furosemide (LASIX) 20 MG tablet TAKE 2-4 TABLETS BY MOUTH DAILY.  Marland Kitchen GLUCOSAMINE-VITAMIN D PO Take 1 tablet by mouth daily.  Marland Kitchen losartan (COZAAR) 50 MG tablet TAKE 1 TABLET BY MOUTH DAILY  . pantoprazole (PROTONIX) 40 MG tablet TAKE 1 TABLET BY MOUTH EVERY DAY  . triamcinolone ointment (KENALOG) 0.1 % Apply 1 application topically 2 (two) times daily as needed (itching).   . Cyanocobalamin (VITAMIN B-12 CR) 1000 MCG TBCR Take 1,000 mcg by mouth daily.   . indomethacin (INDOCIN) 50 MG capsule TAKE 1 CAPSULE BY MOUTH THREE TIMES DAILY AS NEEDED (Patient not taking: Reported on 03/31/2018)    No facility-administered encounter medications on file as of 03/31/2018.     Activities of Daily Living In your present state of health, do you have any difficulty performing the following activities: 03/31/2018  Hearing? Y  Comment Hearing loss in left ear due to previous  fall.  Vision? Y  Comment No peripheral vision in both eyes.   Difficulty concentrating or making decisions? N  Walking or climbing stairs? Y  Comment Due to being fatigued.   Dressing or bathing? N  Doing errands, shopping? N  Preparing Food and eating ? N  Using the Toilet? N  In the past six months, have you accidently leaked urine? N  Do you have problems with loss of bowel control? N  Managing your Medications? N  Managing your Finances? N  Housekeeping or managing your Housekeeping? N  Some recent data might be hidden    Patient Care Team: Birdie Sons, MD as PCP - General (Family Medicine) Corey Skains, MD as Consulting Physician (Internal Medicine) Birder Robson, MD as Referring Physician (Ophthalmology)   Assessment:   This is a routine wellness examination for Taylor [name].  Exercise Activities and Dietary recommendations Current Exercise Habits: Structured exercise class, Type of exercise: treadmill;Other - see comments;strength training/weights;stretching(or stationary bike), Time (Minutes): 30, Frequency (Times/Week): 5, Weekly Exercise (Minutes/Week): 150, Intensity: Mild, Exercise limited by: None identified  Goals    . DIET - REDUCE PORTION SIZE     Recommend decreasing portion sizes by half and eating 3 small meals a day with two healthy snacks in between.        Fall Risk Fall Risk  03/31/2018 02/26/2017 05/12/2015  Falls in the past year? No No No  Risk for fall due to : - - History of fall(s)   Is the patient's home free of loose throw rugs in walkways, pet beds, electrical cords, etc?   yes      Grab bars in the bathroom? no      Handrails on the stairs?   yes      Adequate  lighting?   yes  Timed Get Up and Go Performed: N/A  Depression Screen PHQ 2/9 Scores 03/31/2018 03/31/2018 02/26/2017 02/26/2017  PHQ - 2 Score 1 1 0 0  PHQ- 9 Score 7 - 4 -    Cognitive Function     6CIT Screen 03/31/2018 02/26/2017  What Year? 0 points 0 points  What month? 0 points 0 points  What time? 0 points 0 points  Count back from 20 0 points 0 points  Months in reverse 0 points 0 points  Repeat phrase 2 points 4 points  Total Score 2 4    Immunization History  Administered Date(s) Administered  . Influenza Split 09/18/2011  . Influenza, High Dose Seasonal PF 08/16/2014, 10/09/2016, 10/09/2017  . Influenza,inj,Quad PF,6+ Mos 12/20/2015  . Pneumococcal Conjugate-13 05/09/2014  . Pneumococcal Polysaccharide-23 05/12/2015    Qualifies for Shingles Vaccine? Due for Shingles vaccine. Declined my offer to administer today. Education has been provided regarding the importance of this vaccine. Pt has been advised to call her insurance company to determine her out of pocket expense. Advised she may also receive this vaccine at her local pharmacy or Health Dept. Verbalized acceptance and understanding.  Screening Tests Health Maintenance  Topic Date Due  . TETANUS/TDAP  11/25/2026 (Originally 10/06/1960)  . INFLUENZA VACCINE  06/25/2018  . COLONOSCOPY  06/29/2024  . PNA vac Low Risk Adult  Completed   Cancer Screenings: Lung: Low Dose CT Chest recommended if Age 50-80 years, 30 pack-year currently smoking OR have quit w/in 15years. Patient does not qualify. Colorectal: Up to date  Additional Screenings:  Hepatitis C Screening: N/A      Plan:    I have personally reviewed and addressed  the Medicare Annual Wellness questionnaire and have noted the following in the patient's chart:  A. Medical and social history B. Use of alcohol, tobacco or illicit drugs  C. Current medications and supplements D. Functional ability and status E.  Nutritional status F.  Physical  activity G. Advance directives H. List of other physicians I.  Hospitalizations, surgeries, and ER visits in previous 12 months J.  [place] such as hearing and vision if needed, cognitive and depression L. Referrals and appointments - none  In addition, I have reviewed and discussed with patient certain preventive protocols, quality metrics, and best practice recommendations. A written personalized care plan for preventive services as well as general preventive health recommendations were provided to patient.  See attached scanned questionnaire for additional information.   Signed,  Fabio Neighbors, LPN Nurse Health Advisor   Nurse Recommendations: Pt declined the tetanus vaccine today.

## 2018-03-31 NOTE — Patient Instructions (Addendum)
   The CDC recommends two doses of Shingrix (the shingles vaccine) separated by 2 to 6 months for adults age 76 years and older. I recommend checking with your insurance plan regarding coverage for this vaccine.    Call if you develop persistent pain in your sinuses, shortness of breath fever over 101 F, or wheezing in you chest, or if you are not improving 10 days after start of symptoms.    Please go to the lab draw center in [address] on the second floor of [facility] when you are fasting

## 2018-04-02 DIAGNOSIS — R7303 Prediabetes: Secondary | ICD-10-CM | POA: Diagnosis not present

## 2018-04-02 DIAGNOSIS — N183 Chronic kidney disease, stage 3 (moderate): Secondary | ICD-10-CM | POA: Diagnosis not present

## 2018-04-02 DIAGNOSIS — Z125 Encounter for screening for malignant neoplasm of prostate: Secondary | ICD-10-CM | POA: Diagnosis not present

## 2018-04-02 DIAGNOSIS — E7800 Pure hypercholesterolemia, unspecified: Secondary | ICD-10-CM | POA: Diagnosis not present

## 2018-04-03 ENCOUNTER — Telehealth: Payer: Self-pay | Admitting: Emergency Medicine

## 2018-04-03 LAB — LIPID PANEL
CHOL/HDL RATIO: 3.8 ratio (ref 0.0–5.0)
CHOLESTEROL TOTAL: 107 mg/dL (ref 100–199)
HDL: 28 mg/dL — AB (ref >39)
LDL Calculated: 57 mg/dL (ref 0–99)
TRIGLYCERIDES: 109 mg/dL (ref 0–149)
VLDL Cholesterol Cal: 22 mg/dL (ref 5–40)

## 2018-04-03 LAB — COMPREHENSIVE METABOLIC PANEL
ALT: 8 IU/L (ref 0–44)
AST: 13 IU/L (ref 0–40)
Albumin/Globulin Ratio: 1.5 (ref 1.2–2.2)
Albumin: 4.1 g/dL (ref 3.5–4.8)
Alkaline Phosphatase: 125 IU/L — ABNORMAL HIGH (ref 39–117)
BUN/Creatinine Ratio: 15 (ref 10–24)
BUN: 22 mg/dL (ref 8–27)
Bilirubin Total: 0.8 mg/dL (ref 0.0–1.2)
CALCIUM: 9.4 mg/dL (ref 8.6–10.2)
CO2: 24 mmol/L (ref 20–29)
CREATININE: 1.5 mg/dL — AB (ref 0.76–1.27)
Chloride: 104 mmol/L (ref 96–106)
GFR calc Af Amer: 52 mL/min/{1.73_m2} — ABNORMAL LOW (ref >59)
GFR, EST NON AFRICAN AMERICAN: 45 mL/min/{1.73_m2} — AB (ref >59)
Globulin, Total: 2.7 g/dL (ref 1.5–4.5)
Glucose: 110 mg/dL — ABNORMAL HIGH (ref 65–99)
POTASSIUM: 4.5 mmol/L (ref 3.5–5.2)
Sodium: 144 mmol/L (ref 134–144)
Total Protein: 6.8 g/dL (ref 6.0–8.5)

## 2018-04-03 LAB — PSA: Prostate Specific Ag, Serum: 0.9 ng/mL (ref 0.0–4.0)

## 2018-04-03 LAB — HEMOGLOBIN A1C
Est. average glucose Bld gHb Est-mCnc: 134 mg/dL
Hgb A1c MFr Bld: 6.3 % — ABNORMAL HIGH (ref 4.8–5.6)

## 2018-04-03 NOTE — Telephone Encounter (Signed)
-----   Message from Birdie Sons, MD sent at 04/03/2018  8:12 AM EDT ----- Kidney functions have declined a bit. Need to drink more water. Average sugar is stable at 134. Cholesterol is good at 107. Continue current medications. Follow up 6 months for BP and to check A1c.

## 2018-04-03 NOTE — Telephone Encounter (Signed)
LMTCB on home phone. Called Cell and number just kept ringing.

## 2018-04-07 NOTE — Telephone Encounter (Signed)
A little bit, but he has to take fluid pill for his heart. The way to compensate for this is to drink more water.

## 2018-04-07 NOTE — Telephone Encounter (Signed)
Advised patient of results. Patient wanted to know if the fluid pill he takes could be causing his kidney function to decline? Please advise. Thanks!

## 2018-04-07 NOTE — Telephone Encounter (Signed)
Please advise 

## 2018-04-07 NOTE — Telephone Encounter (Signed)
I called patient to advise as below and he states that he did not ask CMA that question. Patient states that he was told he was dehydrated and wanted to ask Dr. [name] Section if he should discontinue one of his fluid pills since he is taking two of them? KW

## 2018-04-07 NOTE — Telephone Encounter (Signed)
[id number] 

## 2018-04-08 NOTE — Telephone Encounter (Signed)
Please advise 

## 2018-04-08 NOTE — Telephone Encounter (Signed)
He needs to continue taking  2 tablets of furosemide every day due this underlying heart problems. He just needs to increase water intake by 2 glasses a day to help kidney functions better.

## 2018-04-09 NOTE — Telephone Encounter (Signed)
LMOVM for pt to return call 

## 2018-04-15 NOTE — Telephone Encounter (Signed)
LMOVM for pt to return call 

## 2018-04-24 NOTE — Telephone Encounter (Signed)
LMOVM for pt to return call 

## 2018-05-07 ENCOUNTER — Other Ambulatory Visit: Payer: Self-pay

## 2018-05-07 ENCOUNTER — Emergency Department: Payer: Medicare Other

## 2018-05-07 ENCOUNTER — Observation Stay
Admit: 2018-05-07 | Discharge: 2018-05-07 | Disposition: A | Discharge status: Home / Self Care | Payer: Medicare Other | Attending: Internal Medicine | Admitting: Internal Medicine

## 2018-05-07 ENCOUNTER — Observation Stay
Admission: EM | Admit: 2018-05-07 | Discharge: 2018-05-08 | Disposition: A | Discharge status: Home / Self Care | Payer: Medicare Other | Attending: Specialist | Admitting: Specialist

## 2018-05-07 ENCOUNTER — Observation Stay: Payer: Medicare Other

## 2018-05-07 ENCOUNTER — Telehealth: Payer: Self-pay

## 2018-05-07 ENCOUNTER — Encounter: Payer: Self-pay | Admitting: Intensive Care

## 2018-05-07 DIAGNOSIS — R9082 White matter disease, unspecified: Secondary | ICD-10-CM | POA: Diagnosis not present

## 2018-05-07 DIAGNOSIS — G4730 Sleep apnea, unspecified: Secondary | ICD-10-CM | POA: Diagnosis not present

## 2018-05-07 DIAGNOSIS — Z87891 Personal history of nicotine dependence: Secondary | ICD-10-CM | POA: Insufficient documentation

## 2018-05-07 DIAGNOSIS — I2510 Atherosclerotic heart disease of native coronary artery without angina pectoris: Secondary | ICD-10-CM | POA: Insufficient documentation

## 2018-05-07 DIAGNOSIS — I639 Cerebral infarction, unspecified: Secondary | ICD-10-CM

## 2018-05-07 DIAGNOSIS — L409 Psoriasis, unspecified: Secondary | ICD-10-CM | POA: Insufficient documentation

## 2018-05-07 DIAGNOSIS — K219 Gastro-esophageal reflux disease without esophagitis: Secondary | ICD-10-CM | POA: Diagnosis not present

## 2018-05-07 DIAGNOSIS — M109 Gout, unspecified: Secondary | ICD-10-CM | POA: Diagnosis not present

## 2018-05-07 DIAGNOSIS — N183 Chronic kidney disease, stage 3 (moderate): Secondary | ICD-10-CM | POA: Insufficient documentation

## 2018-05-07 DIAGNOSIS — G459 Transient cerebral ischemic attack, unspecified: Secondary | ICD-10-CM | POA: Diagnosis not present

## 2018-05-07 DIAGNOSIS — E1122 Type 2 diabetes mellitus with diabetic chronic kidney disease: Secondary | ICD-10-CM | POA: Insufficient documentation

## 2018-05-07 DIAGNOSIS — G3189 Other specified degenerative diseases of nervous system: Secondary | ICD-10-CM | POA: Diagnosis not present

## 2018-05-07 DIAGNOSIS — I252 Old myocardial infarction: Secondary | ICD-10-CM | POA: Insufficient documentation

## 2018-05-07 DIAGNOSIS — R200 Anesthesia of skin: Secondary | ICD-10-CM | POA: Diagnosis not present

## 2018-05-07 DIAGNOSIS — J320 Chronic maxillary sinusitis: Secondary | ICD-10-CM | POA: Insufficient documentation

## 2018-05-07 DIAGNOSIS — M47812 Spondylosis without myelopathy or radiculopathy, cervical region: Secondary | ICD-10-CM | POA: Insufficient documentation

## 2018-05-07 DIAGNOSIS — Z888 Allergy status to other drugs, medicaments and biological substances status: Secondary | ICD-10-CM | POA: Insufficient documentation

## 2018-05-07 DIAGNOSIS — L4050 Arthropathic psoriasis, unspecified: Secondary | ICD-10-CM | POA: Insufficient documentation

## 2018-05-07 DIAGNOSIS — M1710 Unilateral primary osteoarthritis, unspecified knee: Secondary | ICD-10-CM | POA: Insufficient documentation

## 2018-05-07 DIAGNOSIS — R202 Paresthesia of skin: Secondary | ICD-10-CM | POA: Diagnosis not present

## 2018-05-07 DIAGNOSIS — M50223 Other cervical disc displacement at C6-C7 level: Secondary | ICD-10-CM | POA: Diagnosis not present

## 2018-05-07 DIAGNOSIS — M4802 Spinal stenosis, cervical region: Secondary | ICD-10-CM | POA: Diagnosis not present

## 2018-05-07 DIAGNOSIS — I6523 Occlusion and stenosis of bilateral carotid arteries: Secondary | ICD-10-CM | POA: Insufficient documentation

## 2018-05-07 DIAGNOSIS — I509 Heart failure, unspecified: Secondary | ICD-10-CM | POA: Diagnosis not present

## 2018-05-07 DIAGNOSIS — Z95810 Presence of automatic (implantable) cardiac defibrillator: Secondary | ICD-10-CM | POA: Insufficient documentation

## 2018-05-07 DIAGNOSIS — Z79899 Other long term (current) drug therapy: Secondary | ICD-10-CM | POA: Insufficient documentation

## 2018-05-07 DIAGNOSIS — I1310 Hypertensive heart and chronic kidney disease without heart failure, with stage 1 through stage 4 chronic kidney disease, or unspecified chronic kidney disease: Secondary | ICD-10-CM | POA: Insufficient documentation

## 2018-05-07 DIAGNOSIS — I63233 Cerebral infarction due to unspecified occlusion or stenosis of bilateral carotid arteries: Secondary | ICD-10-CM | POA: Diagnosis not present

## 2018-05-07 DIAGNOSIS — Z9989 Dependence on other enabling machines and devices: Secondary | ICD-10-CM | POA: Insufficient documentation

## 2018-05-07 DIAGNOSIS — Z951 Presence of aortocoronary bypass graft: Secondary | ICD-10-CM | POA: Insufficient documentation

## 2018-05-07 DIAGNOSIS — Z8673 Personal history of transient ischemic attack (TIA), and cerebral infarction without residual deficits: Secondary | ICD-10-CM | POA: Diagnosis not present

## 2018-05-07 DIAGNOSIS — E785 Hyperlipidemia, unspecified: Secondary | ICD-10-CM | POA: Diagnosis not present

## 2018-05-07 DIAGNOSIS — I700 Atherosclerosis of aorta: Secondary | ICD-10-CM | POA: Insufficient documentation

## 2018-05-07 DIAGNOSIS — Z881 Allergy status to other antibiotic agents status: Secondary | ICD-10-CM | POA: Insufficient documentation

## 2018-05-07 DIAGNOSIS — M1990 Unspecified osteoarthritis, unspecified site: Secondary | ICD-10-CM | POA: Insufficient documentation

## 2018-05-07 DIAGNOSIS — Z85820 Personal history of malignant melanoma of skin: Secondary | ICD-10-CM | POA: Insufficient documentation

## 2018-05-07 DIAGNOSIS — I714 Abdominal aortic aneurysm, without rupture: Secondary | ICD-10-CM | POA: Insufficient documentation

## 2018-05-07 DIAGNOSIS — I10 Essential (primary) hypertension: Secondary | ICD-10-CM | POA: Diagnosis not present

## 2018-05-07 DIAGNOSIS — Z7982 Long term (current) use of aspirin: Secondary | ICD-10-CM | POA: Insufficient documentation

## 2018-05-07 DIAGNOSIS — Z8551 Personal history of malignant neoplasm of bladder: Secondary | ICD-10-CM | POA: Insufficient documentation

## 2018-05-07 LAB — LIPID PANEL
Cholesterol: 96 mg/dL (ref 0–200)
HDL: 28 mg/dL — ABNORMAL LOW (ref >40)
LDL Cholesterol: 54 mg/dL (ref 0–99)
Total CHOL/HDL Ratio: 3.4 RATIO
Triglycerides: 71 mg/dL (ref <150)
VLDL: 14 mg/dL (ref 0–40)

## 2018-05-07 LAB — COMPREHENSIVE METABOLIC PANEL
ALT: 19 U/L (ref 17–63)
AST: 29 U/L (ref 15–41)
Albumin: 4.1 g/dL (ref 3.5–5.0)
Alkaline Phosphatase: 102 U/L (ref 38–126)
Anion gap: 6 (ref 5–15)
BUN: 28 mg/dL — AB (ref 6–20)
CHLORIDE: 105 mmol/L (ref 101–111)
CO2: 29 mmol/L (ref 22–32)
CREATININE: 1.3 mg/dL — AB (ref 0.61–1.24)
Calcium: 8.9 mg/dL (ref 8.9–10.3)
GFR calc Af Amer: >60 mL/min (ref >60)
GFR calc non Af Amer: 52 mL/min — ABNORMAL LOW (ref >60)
Glucose, Bld: 167 mg/dL — ABNORMAL HIGH (ref 65–99)
POTASSIUM: 3.7 mmol/L (ref 3.5–5.1)
Sodium: 140 mmol/L (ref 135–145)
Total Bilirubin: 1.2 mg/dL (ref 0.3–1.2)
Total Protein: 7.8 g/dL (ref 6.5–8.1)

## 2018-05-07 LAB — CBC
HCT: 46.9 % (ref 40.0–52.0)
Hemoglobin: 15.5 g/dL (ref 13.0–18.0)
MCH: 32.4 pg (ref 26.0–34.0)
MCHC: 33.1 g/dL (ref 32.0–36.0)
MCV: 97.9 fL (ref 80.0–100.0)
PLATELETS: 156 10*3/uL (ref 150–440)
RBC: 4.79 MIL/uL (ref 4.40–5.90)
RDW: 14.9 % — AB (ref 11.5–14.5)
WBC: 5.9 10*3/uL (ref 3.8–10.6)

## 2018-05-07 LAB — TROPONIN I: Troponin I: <0.03 ng/mL (ref <0.03)

## 2018-05-07 MED ORDER — ACETAMINOPHEN 325 MG PO TABS: 650.0000 mg | ORAL_TABLET | Freq: Four times a day (QID) | ORAL | Status: DC | PRN

## 2018-05-07 MED ORDER — ATORVASTATIN CALCIUM 20 MG PO TABS: 40.0000 mg | ORAL_TABLET | Freq: Every day | ORAL | Status: DC

## 2018-05-07 MED ORDER — CARVEDILOL 12.5 MG PO TABS
12.5000 mg | ORAL_TABLET | Freq: Every day | ORAL | Status: DC
  Filled 2018-05-07 (×2): qty 1

## 2018-05-07 MED ORDER — PERFLUTREN LIPID MICROSPHERE
1.0000 mL | INTRAVENOUS | Status: AC | PRN
  Administered 2018-05-07: 2 mL via INTRAVENOUS
  Filled 2018-05-07: qty 10

## 2018-05-07 MED ORDER — TRIAMCINOLONE ACETONIDE 0.1 % EX OINT
1.0000 "application " | TOPICAL_OINTMENT | Freq: Two times a day (BID) | CUTANEOUS | Status: DC | PRN
  Filled 2018-05-07: qty 15

## 2018-05-07 MED ORDER — ONDANSETRON HCL 4 MG PO TABS: 4.0000 mg | ORAL_TABLET | Freq: Four times a day (QID) | ORAL | Status: DC | PRN

## 2018-05-07 MED ORDER — ENOXAPARIN SODIUM 40 MG/0.4ML ~~LOC~~ SOLN
40.0000 mg | SUBCUTANEOUS | Status: DC
  Administered 2018-05-07: 40 mg via SUBCUTANEOUS
  Filled 2018-05-07: qty 0.4

## 2018-05-07 MED ORDER — ONDANSETRON HCL 4 MG/2ML IJ SOLN: 4.0000 mg | Freq: Four times a day (QID) | INTRAMUSCULAR | Status: DC | PRN

## 2018-05-07 MED ORDER — ASPIRIN 81 MG PO CHEW
324.0000 mg | CHEWABLE_TABLET | Freq: Once | ORAL | Status: AC
  Administered 2018-05-07: 324 mg via ORAL

## 2018-05-07 MED ORDER — STROKE: EARLY STAGES OF RECOVERY BOOK
Freq: Once | Status: AC
  Administered 2018-05-07: 17:00:00

## 2018-05-07 MED ORDER — ACETAMINOPHEN 650 MG RE SUPP: 650.0000 mg | Freq: Four times a day (QID) | RECTAL | Status: DC | PRN

## 2018-05-07 MED ORDER — ASPIRIN EC 81 MG PO TBEC: 81.0000 mg | DELAYED_RELEASE_TABLET | Freq: Every day | ORAL | Status: DC

## 2018-05-07 MED ORDER — FUROSEMIDE 40 MG PO TABS: 40.0000 mg | ORAL_TABLET | Freq: Every day | ORAL | Status: DC

## 2018-05-07 MED ORDER — ALLOPURINOL 300 MG PO TABS
300.0000 mg | ORAL_TABLET | Freq: Every day | ORAL | Status: DC
  Filled 2018-05-07 (×2): qty 1

## 2018-05-07 MED ORDER — VITAMIN B-12 1000 MCG PO TABS: 1000.0000 ug | ORAL_TABLET | Freq: Every day | ORAL | Status: DC

## 2018-05-07 MED ORDER — PANTOPRAZOLE SODIUM 40 MG PO TBEC: 40.0000 mg | DELAYED_RELEASE_TABLET | Freq: Every day | ORAL | Status: DC

## 2018-05-07 MED ORDER — LOSARTAN POTASSIUM 50 MG PO TABS: 50.0000 mg | ORAL_TABLET | Freq: Every day | ORAL | Status: DC

## 2018-05-07 NOTE — ED Notes (Signed)
Pt returned from CT at this time.  

## 2018-05-07 NOTE — ED Provider Notes (Signed)
[facility] Emergency Department Provider Note  Time seen: 9:09 AM  I have reviewed the triage vital signs and the nursing notes.   HISTORY  Chief Complaint Numbness (Right hand)    HPI Taylor [name] is a 77 y.o. male with a past medical history of CAD, CHF, MI with cardiac stents, AICD, hypertension, hyperlipidemia, prior CVA with visual deficits, presents to the emergency department with right arm numbness.  According to the patient yesterday morning he noticed some discomfort in his right neck which he describes as a "kink."  States minimal discomfort in the neck, later in the day he noticed numbness and tingling sensation down his right arm into his right hand.  Patient states he went to sleep last night with the numbness in the right arm hoping it would go away but this morning.  Continued to have numbness this morning so he came to the emergency department for evaluation.  Largely negative review of systems.  No other deficits per patient.   Past Medical History:  Diagnosis Date  . AAA (abdominal aortic aneurysm) [facility]) 06/03/2007   [facility]; Dr. Kellie Simmering  . AICD (automatic cardioverter/defibrillator) present   . Barrett's esophagus   . Bladder cancer ([place])   . Bradycardia   . CAD (coronary artery disease)   . CHF (congestive heart failure) ([place])   . Cluster headache   . DDD (degenerative disc disease), lumbar   . Dyspnea    WITH EXERTION  . Edema    LEFT ANKLE  . Fracture of skull base ([place]) 1997   due to fall  . GERD (gastroesophageal reflux disease)   . Gout   . History of bladder cancer 12/1995  . Hyperlipidemia   . Hypertension   . Malignant melanoma ([place]) 12/2012   right dorsal forearm excised  . Myocardial infarction ([place])    LAST 2014  . Osteoarthritis of knee   . Pacemaker 10/10/2006  . Pneumonia    2016  . Pre-diabetes   . Psoriasis   . Rib fracture 1997   due to fall  . Sleep apnea    CPAP  . Stroke ([place])    . Venous incompetence     Patient Active Problem List   Diagnosis Date Noted  . Benign colonic polyp 03/24/2017  . Tibialis anterior tenosynovitis 12/21/2015  . Pleural nodule 07/27/2015  . Psoriatic arthritis ([place]) 05/12/2015  . Restrictive lung disease 05/12/2015  . Barrett esophagus 03/30/2015  . Ache in joint 03/30/2015  . Bradycardia 03/30/2015  . Arteriosclerosis of coronary artery 03/30/2015  . Cardiac defibrillator in place 03/30/2015  . Chronic kidney disease (CKD), stage III (moderate) ([place]) 03/30/2015  . Cluster headache syndrome 03/30/2015  . Degeneration of lumbar or lumbosacral intervertebral disc 03/30/2015  . Gout 03/30/2015  . Personal history of malignant neoplasm of bladder 03/30/2015  . Hypercholesteremia 03/30/2015  . LBP (low back pain) 03/30/2015  . Personal history of malignant melanoma of skin 03/30/2015  . Muscle ache 03/30/2015  . Arthritis of knee, degenerative 03/30/2015  . Prediabetes 03/30/2015  . Psoriasis 03/30/2015  . Obstructive sleep apnea 03/30/2015  . Chronic venous insufficiency 03/30/2015  . History of abdominal aortic aneurysm (AAA) 06/24/2013  . History of CVA (cerebrovascular accident) 06/24/2013  . Cardiomyopathy, ischemic 06/24/2013  . Essential (primary) hypertension 08/17/2011  . Arrhythmia, sinus node 08/17/2011  . Ventricular fibrillation ([place]) 08/17/2011  . Congestive heart failure ([place]) 02/26/2010  . 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  Cardiac pacemaker in situ 10/10/2006    Past Surgical History:  Procedure Laterality Date  . ABDOMINAL AORTIC ANEURYSM REPAIR  06/03/2007   [facility]; Dr. Kellie Simmering  . ANGIOPLASTY  1994   MI  . BLADDER TUMOR EXCISION  12/1995  . CATARACT EXTRACTION W/PHACO Left 10/22/2016   Procedure: CATARACT EXTRACTION PHACO AND INTRAOCULAR LENS PLACEMENT (IOC);  Surgeon: Birder Robson, MD;  Location: [facility] ORS;  Service: Ophthalmology;  Laterality:  Left;  [place] 47.7 AP% 18.4 CDE 8.78 Fluid pack lot # [id number]  . CATARACT EXTRACTION W/PHACO Right 12/10/2016   Procedure: CATARACT EXTRACTION PHACO AND INTRAOCULAR LENS PLACEMENT (IOC);  Surgeon: Birder Robson, MD;  Location: [facility] ORS;  Service: Ophthalmology;  Laterality: Right;  [place] 00:39 AP% 23.3 CDE 9.13 Fluid pack lot # [id number]  . CORONARY ANGIOPLASTY     STENTS X 5  . CORONARY ARTERY BYPASS GRAFT  09/22/2006   four  . ELBOW BURSA SURGERY     DUE TO GOUT  . INSERT / REPLACE / REMOVE PACEMAKER    . MELANOMA EXCISION  12/2012   Right forearm    Prior to Admission medications   Medication Sig Start Date End Date Taking? Authorizing Provider  allopurinol (ZYLOPRIM) 300 MG tablet TAKE 1 TABLET BY MOUTH EVERY DAY 11/27/17   Birdie Sons, MD  aspirin 81 MG EC tablet Take 81 mg by mouth daily.     [provider]  atorvastatin (LIPITOR) 40 MG tablet TAKE 1 TABLET BY MOUTH EVERY DAY 11/27/17   Birdie Sons, MD  carvedilol (COREG) 12.5 MG tablet TAKE 1 TABLET BY MOUTH DAILY 10/04/17   Birdie Sons, MD  Cyanocobalamin (VITAMIN B-12 CR) 1000 MCG TBCR Take 1,000 mcg by mouth daily.     [provider]  furosemide (LASIX) 20 MG tablet TAKE 2-4 TABLETS BY MOUTH DAILY. 07/25/17   Birdie Sons, MD  GLUCOSAMINE-VITAMIN D PO Take 1 tablet by mouth daily.    [provider]  indomethacin (INDOCIN) 50 MG capsule TAKE 1 CAPSULE BY MOUTH THREE TIMES DAILY AS NEEDED 06/24/16   Birdie Sons, MD  losartan (COZAAR) 50 MG tablet TAKE 1 TABLET BY MOUTH DAILY 02/02/18   Birdie Sons, MD  pantoprazole (PROTONIX) 40 MG tablet TAKE 1 TABLET BY MOUTH EVERY DAY 02/02/18   Birdie Sons, MD  triamcinolone ointment (KENALOG) 0.1 % Apply 1 application topically 2 (two) times daily as needed (itching). 03/31/18   Birdie Sons, MD    Allergies  Allergen Reactions  . Amlodipine Besylate Swelling    Had a reaction when taking with colcrys   . Crestor [Rosuvastatin]      Muscle cramps and pain  . Rocephin [Ceftriaxone]     unknown    Family History  Problem Relation Age of Onset  . Cancer Mother        Melanoma skin cancer  . Heart attack Father 80  . Cancer Father        throat cancer  . Arthritis Brother     Social History Social History   Tobacco Use  . Smoking status: Former Smoker    Packs/day: 1.00    Types: Cigarettes    Last attempt to quit: 11/26/1999    Years since quitting: 18.4  . Smokeless tobacco: Never Used  Substance Use Topics  . Alcohol use: No  . Drug use: No    Review of Systems Constitutional: Negative for fever. Eyes: Negative for visual complaints  ENT: Negative for recent illness/congestion Cardiovascular: Negative for chest pain. Respiratory: Negative for shortness of breath. Gastrointestinal: Negative for abdominal pain Genitourinary: Negative for urinary compaints Musculoskeletal: Negative for musculoskeletal complaints Skin: Negative for skin complaints  Neurological: Negative for headache.  Right arm numbness. All other ROS negative  ____________________________________________   PHYSICAL EXAM:  VITAL SIGNS: ED Triage Vitals  Enc Vitals Group     BP 05/07/18 0854 (!) 152/81     Pulse Rate 05/07/18 0854 (!) 54     Resp 05/07/18 0854 16     Temp 05/07/18 0854 97.8 F (36.6 C)     Temp Source 05/07/18 0854 Oral     SpO2 05/07/18 0854 95 %     Weight 05/07/18 0855 215 lb (97.5 kg)     Height 05/07/18 0855 5\' 10"  (1.778 m)     Head Circumference --      Peak Flow --      Pain Score 05/07/18 0855 6     Pain Loc --      Pain Edu? --      Excl. in [place]? --    Constitutional: Alert and oriented. Well appearing and in no distress. Eyes: Normal exam ENT   Head: Normocephalic and atraumatic.   Mouth/Throat: Mucous membranes are moist. Cardiovascular: Normal rate, regular rhythm. No murmur Respiratory: Normal respiratory effort without tachypnea nor retractions. Breath sounds are clear   Gastrointestinal: Soft and nontender. No distention.  Musculoskeletal: Nontender with normal range of motion in all extremities.  Neurologic:  Normal speech and language.  Equal grip strength bilaterally.  5/5 motor in all extremities without drift.  No pronator drift.  Cranial nerves appear to be intact.  On sensory exam the patient does complain of decreased sensation throughout the right arm and right hand. Skin:  Skin is warm, dry and intact.  Psychiatric: Mood and affect are normal.   ____________________________________________    EKG  EKG reviewed and interpreted by myself shows an atrial paced rhythm at 54 bpm, widened QRS, left axis deviation, largely normal intervals otherwise, nonspecific ST changes.  ____________________________________________    RADIOLOGY  No acute findings on CT of the head  ____________________________________________   INITIAL IMPRESSION / ASSESSMENT AND PLAN / ED COURSE  Pertinent labs & imaging results that were available during my care of the patient were reviewed by me and considered in my medical decision making (see chart for details).  Patient presents to the emergency department for right arm numbness starting yesterday.  Differential at this time would include CVA, ICH, peripheral neuropathy, radiculopathy.  No pain or discomfort elicited with neck or head movement.  Great strength in all extremities including right upper extremity.  Patient does have numbness in the right arm per patient.  Can feel me but states it is decreased significantly compared to the left side.  We will obtain CT imaging of the head, lab work and continue to closely monitor.  Unfortunately patient does have a pacemaker/AICD and we are unable to perform an MRI at this facility.  No acute findings on head CT.  Labs largely at baseline.  Given the patient's symptoms I discussed with neurology as we are unable to obtain an MRI due to pacemaker.  She states the symptoms  are concerning enough for CVA, recommends obtaining a noncontrasted CT of the cervical spine as a precaution and admit for CVA work-up.  I discussed this with the patient he is agreeable to this plan of care.  We will  dose aspirin.  NIH Stroke Scale   Interval: Baseline Time: 10:04 AM Person Administering Scale: Harvest Dark  Administer stroke scale items in the order listed. Record performance in each category after each subscale exam. Do not go back and change scores. Follow directions provided for each exam technique. Scores should reflect what the patient does, not what the clinician thinks the patient can do. The clinician should record answers while administering the exam and work quickly. Except where indicated, the patient should not be coached (i.e., repeated requests to patient to make a special effort).   1a  Level of consciousness: 0=alert; keenly responsive  1b. LOC questions:  0=Performs both tasks correctly  1c. LOC commands: 0=Performs both tasks correctly  2.  Best Gaze: 0=normal  3.  Visual: 0=No visual loss  4. Facial Palsy: 0=Normal symmetric movement  5a.  Motor left arm: 0=No drift, limb holds 90 (or 45) degrees for full 10 seconds  5b.  Motor right arm: 0=No drift, limb holds 90 (or 45) degrees for full 10 seconds  6a. motor left leg: 0=No drift, limb holds 90 (or 45) degrees for full 10 seconds  6b  Motor right leg:  0=No drift, limb holds 90 (or 45) degrees for full 10 seconds  7. Limb Ataxia: 0=Absent  8.  Sensory: 1=Mild to moderate sensory loss; patient feels pinprick is less sharp or is dull on the affected side; there is a loss of superficial pain with pinprick but patient is aware He is being touched  9. Best Language:  0=No aphasia, normal  10. Dysarthria: 0=Normal  11. Extinction and Inattention: 0=No abnormality  12. Distal motor function: 0=Normal   Total:   1    ____________________________________________   FINAL CLINICAL IMPRESSION(S) / ED  DIAGNOSES  Right arm numbness    Harvest Dark, MD 05/07/18 1005

## 2018-05-07 NOTE — ED Notes (Signed)
Hospitalist in room at this time.  

## 2018-05-07 NOTE — Care Management (Signed)
RNCM met with patient and his wife while in ED to discuss [facility] letter. Patient is independent form home; drives. PCP is Dr. [name] Section. He denies problems with medications.

## 2018-05-07 NOTE — ED Notes (Signed)
Family at bedside/ skin warm and dry. resp even and nonlabored/paced on monitor/ pt denies pain

## 2018-05-07 NOTE — Progress Notes (Signed)
OT Cancellation Note  Patient Details Name: Taylor [name] MRN: [id number] DOB: 1941-09-24   Cancelled Treatment:    Reason Eval/Treat Not Completed: Patient at procedure or test/ unavailable. Order received, chart reviewed. Pt out of room for testing. Will re-attempt OT evaluation at later date/time as pt is available and medically appropriate.  Jeni Salles, MPH, MS, OTR/L ascom [phone] 05/07/18, 3:30 PM

## 2018-05-07 NOTE — ED Triage Notes (Signed)
Patient states "yesterday around two I started feeling a kink in my neck and my Right hand started tingling yesterday afternoon. When I went to sleep last night and my hand/fingers still felt tingling when I woke up is why I came in thinking stroke symptoms" A&O x4. No facial droop. Speech clear. No problems ambulating

## 2018-05-07 NOTE — Consult Note (Signed)
Referring Physician: Tressia Miners    Chief Complaint: Right hand numbness  HPI: Taylor [name] is an 77 y.o. male with a significant cardiac history who reports that on yesterday while at home had the acute onset of neck pain.  Patient then had the sensation as if hs skin was burnt on his neck.  Tingling then developed in his fingertips on his right hand.  Symptoms did not resolve overnight and patient presented for evaluation today.   Initial NIHSS of 1.    Date last known well: Date: 05/06/2018 Time last known well: Time: 15:00 tPA Given: No: Outside time window  Past Medical History:  Diagnosis Date  . AAA (abdominal aortic aneurysm) [facility]) 06/03/2007   [facility]; Dr. Kellie Simmering  . AICD (automatic cardioverter/defibrillator) present   . Barrett's esophagus   . Bladder cancer ([place])   . Bradycardia   . CAD (coronary artery disease)   . CHF (congestive heart failure) ([place])   . Cluster headache   . DDD (degenerative disc disease), lumbar   . Dyspnea    WITH EXERTION  . Edema    LEFT ANKLE  . Fracture of skull base ([place]) 1997   due to fall  . GERD (gastroesophageal reflux disease)   . Gout   . History of bladder cancer 12/1995  . Hyperlipidemia   . Hypertension   . Malignant melanoma ([place]) 12/2012   right dorsal forearm excised  . Myocardial infarction ([place])    LAST 2014  . Osteoarthritis of knee   . Pacemaker 10/10/2006  . Pneumonia    2016  . Pre-diabetes   . Psoriasis   . Rib fracture 1997   due to fall  . Sleep apnea    CPAP  . Stroke ([place])   . Venous incompetence     Past Surgical History:  Procedure Laterality Date  . ABDOMINAL AORTIC ANEURYSM REPAIR  06/03/2007   [facility]; Dr. Kellie Simmering  . ANGIOPLASTY  1994   MI  . BLADDER TUMOR EXCISION  12/1995  . CATARACT EXTRACTION W/PHACO Left 10/22/2016   Procedure: CATARACT EXTRACTION PHACO AND INTRAOCULAR LENS PLACEMENT (IOC);  Surgeon: Birder Robson, MD;  Location: [facility] ORS;   Service: Ophthalmology;  Laterality: Left;  [place] 47.7 AP% 18.4 CDE 8.78 Fluid pack lot # [id number]  . CATARACT EXTRACTION W/PHACO Right 12/10/2016   Procedure: CATARACT EXTRACTION PHACO AND INTRAOCULAR LENS PLACEMENT (IOC);  Surgeon: Birder Robson, MD;  Location: [facility] ORS;  Service: Ophthalmology;  Laterality: Right;  [place] 00:39 AP% 23.3 CDE 9.13 Fluid pack lot # [id number]  . CORONARY ANGIOPLASTY     STENTS X 5  . CORONARY ARTERY BYPASS GRAFT  09/22/2006   four  . ELBOW BURSA SURGERY     DUE TO GOUT  . INSERT / REPLACE / REMOVE PACEMAKER    . MELANOMA EXCISION  12/2012   Right forearm    Family History  Problem Relation Age of Onset  . Cancer Mother        Melanoma skin cancer  . Heart attack Father 82  . Cancer Father        throat cancer  . Arthritis Brother    Social History:  reports that he quit smoking about 18 years ago. His smoking use included cigarettes. He smoked 1.00 pack per day. He has never used smokeless tobacco. He reports that he does not drink alcohol or use drugs.  Allergies:  Allergies  Allergen Reactions  . Amlodipine Besylate Swelling  Had a reaction when taking with colcrys   . Crestor [Rosuvastatin]     Muscle cramps and pain  . Rocephin [Ceftriaxone]     unknown    Medications:  I have reviewed the patient's current medications. Prior to Admission:  Medications Prior to Admission  Medication Sig Dispense Refill Last Dose  . allopurinol (ZYLOPRIM) 300 MG tablet TAKE 1 TABLET BY MOUTH EVERY DAY 90 tablet 2 05/06/2018 at Unknown time  . aspirin 81 MG EC tablet Take 81 mg by mouth daily.    05/06/2018 at Unknown time  . atorvastatin (LIPITOR) 40 MG tablet TAKE 1 TABLET BY MOUTH EVERY DAY 90 tablet 2 05/06/2018 at Unknown time  . carvedilol (COREG) 12.5 MG tablet TAKE 1 TABLET BY MOUTH DAILY 60 tablet 5 05/06/2018 at Unknown time  . Cyanocobalamin (VITAMIN B-12 CR) 1000 MCG TBCR Take 1,000 mcg by mouth daily.    05/06/2018 at Unknown time  . furosemide  (LASIX) 20 MG tablet TAKE 2-4 TABLETS BY MOUTH DAILY. (Patient taking differently: TAKE 40 TABLETS BY MOUTH DAILY.) 120 tablet 5 05/06/2018 at Unknown time  . GLUCOSAMINE-VITAMIN D PO Take 1 tablet by mouth daily.   05/06/2018 at Unknown time  . losartan (COZAAR) 50 MG tablet TAKE 1 TABLET BY MOUTH DAILY 90 tablet 4 05/06/2018 at Unknown time  . pantoprazole (PROTONIX) 40 MG tablet TAKE 1 TABLET BY MOUTH EVERY DAY 90 tablet 4 05/06/2018 at Unknown time  . indomethacin (INDOCIN) 50 MG capsule TAKE 1 CAPSULE BY MOUTH THREE TIMES DAILY AS NEEDED 270 capsule 1 prn at prn  . triamcinolone ointment (KENALOG) 0.1 % Apply 1 application topically 2 (two) times daily as needed (itching). 30 g 3 prn at prn   Scheduled: . allopurinol  300 mg Oral Daily  . aspirin EC  81 mg Oral Daily  . atorvastatin  40 mg Oral Daily  . carvedilol  12.5 mg Oral Daily  . enoxaparin (LOVENOX) injection  40 mg Subcutaneous Q24H  . furosemide  40 mg Oral Daily  . losartan  50 mg Oral Daily  . pantoprazole  40 mg Oral Daily  . vitamin B-12  1,000 mcg Oral Daily    ROS: History obtained from the patient  General ROS: negative for - chills, fatigue, fever, night sweats, weight gain or weight loss Psychological ROS: negative for - behavioral disorder, hallucinations, memory difficulties, mood swings or suicidal ideation Ophthalmic ROS: negative for - blurry vision, double vision, eye pain or loss of vision ENT ROS: negative for - epistaxis, nasal discharge, oral lesions, sore throat, tinnitus or vertigo Allergy and Immunology ROS: negative for - hives or itchy/watery eyes Hematological and Lymphatic ROS: negative for - bleeding problems, bruising or swollen lymph nodes Endocrine ROS: negative for - galactorrhea, hair pattern changes, polydipsia/polyuria or temperature intolerance Respiratory ROS: negative for - cough, hemoptysis, shortness of breath or wheezing Cardiovascular ROS: negative for - chest pain, dyspnea on  exertion, edema or irregular heartbeat Gastrointestinal ROS: negative for - abdominal pain, diarrhea, hematemesis, nausea/vomiting or stool incontinence Genito-Urinary ROS: negative for - dysuria, hematuria, incontinence or urinary frequency/urgency Musculoskeletal ROS: as noted in HPI Neurological ROS: as noted in HPI Dermatological ROS: negative for rash and skin lesion changes  Physical Examination: Blood pressure (!) 162/94, pulse (!) 50, temperature (!) 97.4 F (36.3 C), temperature source Oral, resp. rate 16, height 5\' 10"  (1.778 m), weight 98.7 kg (217 lb 8 oz), SpO2 100 %.  HEENT-  Normocephalic, no lesions, without obvious abnormality.  Normal external eye and conjunctiva.  Normal TM's bilaterally.  Normal auditory canals and external ears. Normal external nose, mucus membranes and septum.  Normal pharynx. Cardiovascular- S1, S2 normal, pulses palpable throughout   Lungs- chest clear, no wheezing, rales, normal symmetric air entry Abdomen- soft, non-tender; bowel sounds normal; no masses,  no organomegaly Extremities- no edema Lymph-no adenopathy palpable Musculoskeletal-no joint tenderness, deformity or swelling Skin-skin dry and flaky  Neurological Examination   Mental Status: Alert, oriented, thought content appropriate.  Speech fluent without evidence of aphasia.  Able to follow 3 step commands without difficulty. Cranial Nerves: II: Discs flat bilaterally; Visual fields grossly normal, pupils equal, round, reactive to light and accommodation III,IV, VI: ptosis not present, extra-ocular motions intact bilaterally V,VII: smile symmetric, facial light touch sensation normal bilaterally VIII: hearing normal bilaterally IX,X: gag reflex present XI: bilateral shoulder shrug XII: midline tongue extension Motor: Right : Upper extremity   5/5    Left:     Upper extremity   5/5  Lower extremity   5/5     Lower extremity   5/5 Tone and bulk:normal tone throughout; no atrophy  noted Sensory: Pinprick and light touch decreased on the fingertips on the right hand, thumb and pinky less so than the other digits Deep Tendon Reflexes: 2+ and symmetric with absent AJ's bilaterally Plantars: Right: downgoing   Left: downgoing Cerebellar: Normal finger-to-nose and normal heel-to-shin testing bilaterally Gait: not tested due to safety concerns   Laboratory Studies:  Basic Metabolic Panel: Recent Labs  Lab 05/07/18 0913  NA 140  K 3.7  CL 105  CO2 29  GLUCOSE 167*  BUN 28*  CREATININE 1.30*  CALCIUM 8.9    Liver Function Tests: Recent Labs  Lab 05/07/18 0913  AST 29  ALT 19  ALKPHOS 102  BILITOT 1.2  PROT 7.8  ALBUMIN 4.1   No results for input(s): LIPASE, AMYLASE in the last 168 hours. No results for input(s): AMMONIA in the last 168 hours.  CBC: Recent Labs  Lab 05/07/18 0913  WBC 5.9  HGB 15.5  HCT 46.9  MCV 97.9  PLT 156    Cardiac Enzymes: Recent Labs  Lab 05/07/18 0913  TROPONINI <0.03    BNP: Invalid input(s): POCBNP  CBG: No results for input(s): GLUCAP in the last 168 hours.  Microbiology: No results found for this or any previous visit.  Coagulation Studies: No results for input(s): LABPROT, INR in the last 72 hours.  Urinalysis: No results for input(s): COLORURINE, LABSPEC, PHURINE, GLUCOSEU, HGBUR, BILIRUBINUR, KETONESUR, PROTEINUR, UROBILINOGEN, NITRITE, LEUKOCYTESUR in the last 168 hours.  Invalid input(s): APPERANCEUR  Lipid Panel:    Component Value Date/Time   CHOL 96 05/07/2018 1240   CHOL 107 04/02/2018 0901   TRIG 71 05/07/2018 1240   HDL 28 (L) 05/07/2018 1240   HDL 28 (L) 04/02/2018 0901   CHOLHDL 3.4 05/07/2018 1240   VLDL 14 05/07/2018 1240   LDLCALC 54 05/07/2018 1240   LDLCALC 57 04/02/2018 0901    HgbA1C:  Lab Results  Component Value Date   HGBA1C 6.3 (H) 04/02/2018    Urine Drug Screen:  No results found for: LABOPIA, COCAINSCRNUR, LABBENZ, AMPHETMU, THCU, LABBARB  Alcohol Level:  No results for input(s): ETH in the last 168 hours.  Other results: EKG: Paced rhythm at 54 bpm.  Imaging: Ct Head Wo Contrast  Result Date: 05/07/2018 CLINICAL DATA:  Patient states "yesterday around two I started feeling a kink in my neck and my Right  hand started tingling yesterday afternoon. When I went to sleep last night and my hand/fingers still felt tingling when I woke up is why I came.*comment was truncated* EXAM: CT HEAD WITHOUT CONTRAST TECHNIQUE: Contiguous axial images were obtained from the base of the skull through the vertex without intravenous contrast. COMPARISON:  PET-CT 06/19/2011 FINDINGS: Brain: No acute intracranial hemorrhage. No focal mass lesion. No CT evidence of acute infarction. No midline shift or mass effect. No hydrocephalus. Basilar cisterns are patent. Remote infarction in the medial LEFT occipital lobe There are periventricular and subcortical white matter hypodensities. Generalized cortical atrophy. Vascular: No hyperdense vessel or unexpected calcification. Skull: Normal. Negative for fracture or focal lesion. Sinuses/Orbits: Opacification of the LEFT seen ovoid hemi sinus. Mucosal thickening in the LEFT maxillary sinus with air-fluid level. Frontal sinuses are clear. Other: None IMPRESSION: 1. No acute intracranial findings. 2. Atrophy and mild white matter matter microvascular disease. 3. LEFT maxillary sinusitis. Electronically Signed   By: Suzy Bouchard M.D.   On: 05/07/2018 09:27   Ct Cervical Spine Wo Contrast  Result Date: 05/07/2018 CLINICAL DATA:  Right upper extremity radicular symptoms EXAM: CT CERVICAL SPINE WITHOUT CONTRAST TECHNIQUE: Multidetector CT imaging of the cervical spine was performed without intravenous contrast. Multiplanar CT image reconstructions were also generated. COMPARISON:  None. FINDINGS: Alignment: There is no appreciable spondylolisthesis. Skull base and vertebrae: Skull base and craniocervical junction regions appear normal.  There is no evident fracture. No blastic or lytic bone lesions. Soft tissues and spinal canal: Prevertebral soft tissues and predental space regions are normal. No paraspinous lesions are evident. There is no cord or canal hematoma. Disc levels: There is severe disc space narrowing at C6-7 and C7-T1. There is moderately severe disc space narrowing at C5-6. There is slight disc space narrowing at C4-5. At C2-3, there is mild facet hypertrophy bilaterally. No nerve root edema or effacement. No disc extrusion or stenosis. At C3-4, there is moderate facet hypertrophy on the left and mild facet hypertrophy on the right. There is exit foraminal narrowing on the left with impression on the exiting nerve root at the exit foramen due to bony hypertrophy. No disc extrusion or stenosis at this level. At C4-5, there is moderate facet hypertrophy on the left and mild facet hypertrophy on the right. There is no appreciable nerve root edema or effacement. No disc extrusion or stenosis. At C5-6, there is moderate facet hypertrophy bilaterally. There is bony overgrowth bilaterally at this level causing effacement of the exiting nerve roots bilaterally at the respective exit foramina. No disc extrusion or stenosis. At C6-7, there is moderate facet hypertrophy bilaterally. There is bony hypertrophy impressing upon both exiting nerve roots causing effacement of the nerve roots bilaterally at the exit foraminal levels, similar to changes at C5-6. No disc extrusion or stenosis evident. Note that there is a moderate central disc protrusion at C5-6 which abuts but does not significantly efface the thecal sac. At C7-T1, there is moderate facet hypertrophy bilaterally. No nerve root edema or effacement. No disc extrusion or stenosis. Upper chest: Visualized upper lung regions are clear. There is aortic atherosclerosis. Other: There are foci of calcification in each subclavian artery as well as in the carotid arteries bilaterally. IMPRESSION:  1. Multilevel arthropathy. There is nerve root effacement due to bony hypertrophy at C5-6 and C6-7 bilaterally. No frank disc extrusion or stenosis. 2.  No evident fracture or spondylolisthesis. 3. Aortic atherosclerosis. Foci of calcification in carotid and subclavian arteries bilaterally. Aortic Atherosclerosis (ICD10-I70.0). Electronically  Signed   By: Lowella Grip III M.D.   On: 05/07/2018 10:19    Assessment: 77 y.o. male presenting with right hand tingling.  Etiology unclear.  Head CT reviewed and shows no acute changes.  Patient unable to have MRI of the brain due to pacer.  CT of the cervical spine shows arthritic changes at multiple levels, particularly at C5-6, C6-7 bilaterally.  With inability to determine etiology of symptoms being from cervical disease versus cerebrovascular disease but with multiple vascular risk factors therefore further work up recommended.    Stroke Risk Factors - diabetes mellitus, hyperlipidemia and hypertension  Plan: 1. HgbA1c, fasting lipid panel 2. PT consult, OT consult, Speech consult 3. Echocardiogram 4. Carotid dopplers 5. Prophylactic therapy-Continue ASA 6. NPO until RN stroke swallow screen 7. Telemetry monitoring 8. Frequent neuro checks   Alexis Goodell, MD Neurology [phone] 05/07/2018, 2:18 PM

## 2018-05-07 NOTE — Care Management Obs Status (Signed)
[facility] NOTIFICATION   Patient Details  Name: JOFFREY KERCE MRN: [id number] Date of Birth: Feb 02, 1941   Medicare Observation Status Notification Given:  Yes    Marshell Garfinkel, RN 05/07/2018, 11:54 AM

## 2018-05-07 NOTE — H&P (Signed)
[facility] NAME: Taylor [name]    MR#:  [id number]  DATE OF BIRTH:  06-30-1941  DATE OF ADMISSION:  05/07/2018  PRIMARY CARE PHYSICIAN: Birdie Sons, MD   REQUESTING/REFERRING PHYSICIAN: Dr. Harvest Dark  CHIEF COMPLAINT:   Chief Complaint  Patient presents with  . Numbness    Right hand    HISTORY OF PRESENT ILLNESS:  Taylor [name]  is a 77 y.o. male with a known history of CAD status post bypass surgery, stents, congestive heart failure status post AICD, CKD stage III, hypertension, history of bradycardia status post pacemaker, psoriasis presents from home secondary to right hand fingertips numbness. Symptoms started yesterday afternoon.  Patient thought he pulled his muscle in the neck as it was accompanied by neck pain.  He denies lifting any heavy weights.  Denies any prior symptoms similar to this.  No other tingling or numbness in his legs or to the left hand.  No change in his speech or swallowing.  He had a prior stroke during a cardiac catheterization procedure that has affected his peripheral vision.  He takes an aspirin every day at home.  He tried to sleep off his numbness.  He is right-handed and the strength is normal.  Symptoms were still persistent today and so presented to the emergency room.  CT of the head negative for any acute findings but chronic white matter disease.  CT of the C-spine showing bony hypertrophy in C5-7 level. No other complaints.  PAST MEDICAL HISTORY:   Past Medical History:  Diagnosis Date  . AAA (abdominal aortic aneurysm) [facility]) 06/03/2007   [facility]; Dr. Kellie Simmering  . AICD (automatic cardioverter/defibrillator) present   . Barrett's esophagus   . Bladder cancer ([place])   . Bradycardia   . CAD (coronary artery disease)   . CHF (congestive heart failure) ([place])   . Cluster headache   . DDD (degenerative disc disease), lumbar   . Dyspnea    WITH EXERTION  . Edema     LEFT ANKLE  . Fracture of skull base ([place]) 1997   due to fall  . GERD (gastroesophageal reflux disease)   . Gout   . History of bladder cancer 12/1995  . Hyperlipidemia   . Hypertension   . Malignant melanoma ([facility]) 12/2012   right dorsal forearm excised  . Myocardial infarction ([place])    LAST 2014  . Osteoarthritis of knee   . Pacemaker 10/10/2006  . Pneumonia    2016  . Pre-diabetes   . Psoriasis   . Rib fracture 1997   due to fall  . Sleep apnea    CPAP  . Stroke ([place])   . Venous incompetence     PAST SURGICAL HISTORY:   Past Surgical History:  Procedure Laterality Date  . ABDOMINAL AORTIC ANEURYSM REPAIR  06/03/2007   [facility]; Dr. Kellie Simmering  . ANGIOPLASTY  1994   MI  . BLADDER TUMOR EXCISION  12/1995  . CATARACT EXTRACTION W/PHACO Left 10/22/2016   Procedure: CATARACT EXTRACTION PHACO AND INTRAOCULAR LENS PLACEMENT (IOC);  Surgeon: Birder Robson, MD;  Location: [facility] ORS;  Service: Ophthalmology;  Laterality: Left;  [place] 47.7 AP% 18.4 CDE 8.78 Fluid pack lot # [id number]  . CATARACT EXTRACTION W/PHACO Right 12/10/2016   Procedure: CATARACT EXTRACTION PHACO AND INTRAOCULAR LENS PLACEMENT (IOC);  Surgeon: Birder Robson, MD;  Location: [facility] ORS;  Service: Ophthalmology;  Laterality: Right;  [place] 00:39 AP%  23.3 CDE 9.13 Fluid pack lot # [id number]  . CORONARY ANGIOPLASTY     STENTS X 5  . CORONARY ARTERY BYPASS GRAFT  09/22/2006   four  . ELBOW BURSA SURGERY     DUE TO GOUT  . INSERT / REPLACE / REMOVE PACEMAKER    . MELANOMA EXCISION  12/2012   Right forearm    SOCIAL HISTORY:   Social History   Tobacco Use  . Smoking status: Former Smoker    Packs/day: 1.00    Types: Cigarettes    Last attempt to quit: 11/26/1999    Years since quitting: 18.4  . Smokeless tobacco: Never Used  Substance Use Topics  . Alcohol use: No    FAMILY HISTORY:   Family History  Problem Relation Age of Onset  . Cancer Mother        Melanoma skin cancer    . Heart attack Father 21  . Cancer Father        throat cancer  . Arthritis Brother     DRUG ALLERGIES:   Allergies  Allergen Reactions  . Amlodipine Besylate Swelling    Had a reaction when taking with colcrys   . Crestor [Rosuvastatin]     Muscle cramps and pain  . Rocephin [Ceftriaxone]     unknown    REVIEW OF SYSTEMS:   Review of Systems  Constitutional: Negative for chills, fever, malaise/fatigue and weight loss.  HENT: Negative for ear discharge, ear pain, hearing loss, nosebleeds and tinnitus.   Eyes: Negative for blurred vision, double vision and photophobia.  Respiratory: Negative for cough, hemoptysis, shortness of breath and wheezing.   Cardiovascular: Negative for chest pain, palpitations, orthopnea and leg swelling.  Gastrointestinal: Negative for abdominal pain, constipation, diarrhea, heartburn, melena, nausea and vomiting.  Genitourinary: Negative for dysuria, frequency, hematuria and urgency.  Musculoskeletal: Negative for back pain, myalgias and neck pain.  Skin: Negative for rash.  Neurological: Positive for sensory change. Negative for dizziness, tingling, tremors, speech change, focal weakness and headaches.  Endo/Heme/Allergies: Does not bruise/bleed easily.  Psychiatric/Behavioral: Negative for depression.    MEDICATIONS AT HOME:   Prior to Admission medications   Medication Sig Start Date End Date Taking? Authorizing Provider  allopurinol (ZYLOPRIM) 300 MG tablet TAKE 1 TABLET BY MOUTH EVERY DAY 11/27/17  Yes Birdie Sons, MD  aspirin 81 MG EC tablet Take 81 mg by mouth daily.    Yes [provider]  atorvastatin (LIPITOR) 40 MG tablet TAKE 1 TABLET BY MOUTH EVERY DAY 11/27/17  Yes Birdie Sons, MD  carvedilol (COREG) 12.5 MG tablet TAKE 1 TABLET BY MOUTH DAILY 10/04/17  Yes Birdie Sons, MD  Cyanocobalamin (VITAMIN B-12 CR) 1000 MCG TBCR Take 1,000 mcg by mouth daily.    Yes [provider]  furosemide (LASIX) 20 MG  tablet TAKE 2-4 TABLETS BY MOUTH DAILY. Patient taking differently: TAKE 40 TABLETS BY MOUTH DAILY. 07/25/17  Yes Birdie Sons, MD  GLUCOSAMINE-VITAMIN D PO Take 1 tablet by mouth daily.   Yes [provider]  losartan (COZAAR) 50 MG tablet TAKE 1 TABLET BY MOUTH DAILY 02/02/18  Yes Birdie Sons, MD  pantoprazole (PROTONIX) 40 MG tablet TAKE 1 TABLET BY MOUTH EVERY DAY 02/02/18  Yes Birdie Sons, MD  indomethacin (INDOCIN) 50 MG capsule TAKE 1 CAPSULE BY MOUTH THREE TIMES DAILY AS NEEDED 06/24/16   Birdie Sons, MD  triamcinolone ointment (KENALOG) 0.1 % Apply 1 application topically 2 (two) times daily  as needed (itching). 03/31/18   Birdie Sons, MD      VITAL SIGNS:  Blood pressure (!) 152/81, pulse (!) 54, temperature 97.8 F (36.6 C), temperature source Oral, resp. rate 16, height 5\' 10"  (1.778 m), weight 97.5 kg (215 lb), SpO2 95 %.  PHYSICAL EXAMINATION:   Physical Exam  GENERAL:  77 y.o.-year-old patient lying in the bed with no acute distress.  EYES: Pupils equal, round, reactive to light and accommodation. No scleral icterus. Extraocular muscles intact.  HEENT: Head atraumatic, normocephalic. Oropharynx and nasopharynx clear.  NECK:  Supple, no jugular venous distention. No thyroid enlargement, no tenderness.  LUNGS: Normal breath sounds bilaterally, no wheezing, rales,rhonchi or crepitation. No use of accessory muscles of respiration.  CARDIOVASCULAR: S1, S2 normal. No murmurs, rubs, or gallops.  ABDOMEN: Soft, nontender, nondistended. Bowel sounds present. No organomegaly or mass.  EXTREMITIES: No pedal edema, cyanosis, or clubbing.  NEUROLOGIC: Cranial nerves II through XII are intact. Muscle strength 5/5 in all extremities. Sensation intact. Right hand middle three finger tips numbness Gait not checked.  PSYCHIATRIC: The patient is alert and oriented x 3.  SKIN: No obvious rash, lesion, or ulcer. Skin changes indicative of psoriasis on both  legs  LABORATORY PANEL:   CBC Recent Labs  Lab 05/07/18 0913  WBC 5.9  HGB 15.5  HCT 46.9  PLT 156   ------------------------------------------------------------------------------------------------------------------  Chemistries  Recent Labs  Lab 05/07/18 0913  NA 140  K 3.7  CL 105  CO2 29  GLUCOSE 167*  BUN 28*  CREATININE 1.30*  CALCIUM 8.9  AST 29  ALT 19  ALKPHOS 102  BILITOT 1.2   ------------------------------------------------------------------------------------------------------------------  Cardiac Enzymes Recent Labs  Lab 05/07/18 0913  TROPONINI <0.03   ------------------------------------------------------------------------------------------------------------------  RADIOLOGY:  Ct Head Wo Contrast  Result Date: 05/07/2018 CLINICAL DATA:  Patient states "yesterday around two I started feeling a kink in my neck and my Right hand started tingling yesterday afternoon. When I went to sleep last night and my hand/fingers still felt tingling when I woke up is why I came.*comment was truncated* EXAM: CT HEAD WITHOUT CONTRAST TECHNIQUE: Contiguous axial images were obtained from the base of the skull through the vertex without intravenous contrast. COMPARISON:  PET-CT 06/19/2011 FINDINGS: Brain: No acute intracranial hemorrhage. No focal mass lesion. No CT evidence of acute infarction. No midline shift or mass effect. No hydrocephalus. Basilar cisterns are patent. Remote infarction in the medial LEFT occipital lobe There are periventricular and subcortical white matter hypodensities. Generalized cortical atrophy. Vascular: No hyperdense vessel or unexpected calcification. Skull: Normal. Negative for fracture or focal lesion. Sinuses/Orbits: Opacification of the LEFT seen ovoid hemi sinus. Mucosal thickening in the LEFT maxillary sinus with air-fluid level. Frontal sinuses are clear. Other: None IMPRESSION: 1. No acute intracranial findings. 2. Atrophy and mild white  matter matter microvascular disease. 3. LEFT maxillary sinusitis. Electronically Signed   By: Suzy Bouchard M.D.   On: 05/07/2018 09:27   Ct Cervical Spine Wo Contrast  Result Date: 05/07/2018 CLINICAL DATA:  Right upper extremity radicular symptoms EXAM: CT CERVICAL SPINE WITHOUT CONTRAST TECHNIQUE: Multidetector CT imaging of the cervical spine was performed without intravenous contrast. Multiplanar CT image reconstructions were also generated. COMPARISON:  None. FINDINGS: Alignment: There is no appreciable spondylolisthesis. Skull base and vertebrae: Skull base and craniocervical junction regions appear normal. There is no evident fracture. No blastic or lytic bone lesions. Soft tissues and spinal canal: Prevertebral soft tissues and predental space regions are normal. No paraspinous  lesions are evident. There is no cord or canal hematoma. Disc levels: There is severe disc space narrowing at C6-7 and C7-T1. There is moderately severe disc space narrowing at C5-6. There is slight disc space narrowing at C4-5. At C2-3, there is mild facet hypertrophy bilaterally. No nerve root edema or effacement. No disc extrusion or stenosis. At C3-4, there is moderate facet hypertrophy on the left and mild facet hypertrophy on the right. There is exit foraminal narrowing on the left with impression on the exiting nerve root at the exit foramen due to bony hypertrophy. No disc extrusion or stenosis at this level. At C4-5, there is moderate facet hypertrophy on the left and mild facet hypertrophy on the right. There is no appreciable nerve root edema or effacement. No disc extrusion or stenosis. At C5-6, there is moderate facet hypertrophy bilaterally. There is bony overgrowth bilaterally at this level causing effacement of the exiting nerve roots bilaterally at the respective exit foramina. No disc extrusion or stenosis. At C6-7, there is moderate facet hypertrophy bilaterally. There is bony hypertrophy impressing upon  both exiting nerve roots causing effacement of the nerve roots bilaterally at the exit foraminal levels, similar to changes at C5-6. No disc extrusion or stenosis evident. Note that there is a moderate central disc protrusion at C5-6 which abuts but does not significantly efface the thecal sac. At C7-T1, there is moderate facet hypertrophy bilaterally. No nerve root edema or effacement. No disc extrusion or stenosis. Upper chest: Visualized upper lung regions are clear. There is aortic atherosclerosis. Other: There are foci of calcification in each subclavian artery as well as in the carotid arteries bilaterally. IMPRESSION: 1. Multilevel arthropathy. There is nerve root effacement due to bony hypertrophy at C5-6 and C6-7 bilaterally. No frank disc extrusion or stenosis. 2.  No evident fracture or spondylolisthesis. 3. Aortic atherosclerosis. Foci of calcification in carotid and subclavian arteries bilaterally. Aortic Atherosclerosis (ICD10-I70.0). Electronically Signed   By: Lowella Grip III M.D.   On: 05/07/2018 10:19    EKG:   Orders placed or performed during the hospital encounter of 05/07/18  . ED EKG  . ED EKG    IMPRESSION AND PLAN:   Taylor [name]  is a 77 y.o. male with a known history of CAD status post bypass surgery, stents, congestive heart failure status post AICD, CKD stage III, hypertension, history of bradycardia status post pacemaker, psoriasis presents from home secondary to right hand fingertips numbness.  1. Right hand fingertips numbness- CVA vs radicular symptoms -Not get MRI due to pacemaker.  Will admit, neurochecks. -CT head negative.  CT C-spine with arthritis findings. -PT/OT consults.  Continue aspirin.  Carotid Dopplers and echo ordered. -If needed, Plavix can be added based on test results.  Check lipid panel -Continue aspirin and statin  2.  CAD status post CABG and stents-stable at this time.  Continue cardiac medications  3.  CKD stage III-stable.  Avoid  nephrotoxins  4.  Psoriasis-continue triamcinolone ointment on both legs  5.  DVT prophylaxis-Lovenox    All the records are reviewed and case discussed with ED provider. Management plans discussed with the patient, family and they are in agreement.  CODE STATUS: Full Code  TOTAL TIME TAKING CARE OF THIS PATIENT: 50 minutes.    Gladstone Lighter M.D on 05/07/2018 at 11:16 AM  Between 7am to 6pm - Pager - [phone]  After 6pm go to [email or web address] - password [email or web address] [facility]  Office  [phone]  CC: Primary  care physician; Birdie Sons, MD

## 2018-05-07 NOTE — Telephone Encounter (Signed)
Patient called stating he thought he had or was having a stroke.  He described numbness in his hand since yesterday, a feeling in his neck like he was getting a 'crick' and it sounded like his speech was slightly garbled.   He was advised to go to the hospital and said he would have someone drive him

## 2018-05-07 NOTE — Progress Notes (Signed)
*  PRELIMINARY RESULTS* Echocardiogram 2D Echocardiogram has been performed.  Taylor [name] 05/07/2018, 3:06 PM

## 2018-05-08 DIAGNOSIS — I2510 Atherosclerotic heart disease of native coronary artery without angina pectoris: Secondary | ICD-10-CM | POA: Diagnosis not present

## 2018-05-08 DIAGNOSIS — R200 Anesthesia of skin: Secondary | ICD-10-CM | POA: Diagnosis not present

## 2018-05-08 DIAGNOSIS — N183 Chronic kidney disease, stage 3 (moderate): Secondary | ICD-10-CM | POA: Diagnosis not present

## 2018-05-08 DIAGNOSIS — I10 Essential (primary) hypertension: Secondary | ICD-10-CM | POA: Diagnosis not present

## 2018-05-08 LAB — BASIC METABOLIC PANEL
ANION GAP: 10 (ref 5–15)
BUN: 28 mg/dL — ABNORMAL HIGH (ref 6–20)
CALCIUM: 8.8 mg/dL — AB (ref 8.9–10.3)
CHLORIDE: 103 mmol/L (ref 101–111)
CO2: 27 mmol/L (ref 22–32)
Creatinine, Ser: 1.3 mg/dL — ABNORMAL HIGH (ref 0.61–1.24)
GFR calc Af Amer: >60 mL/min (ref >60)
GFR calc non Af Amer: 52 mL/min — ABNORMAL LOW (ref >60)
GLUCOSE: 98 mg/dL (ref 65–99)
POTASSIUM: 3.4 mmol/L — AB (ref 3.5–5.1)
Sodium: 140 mmol/L (ref 135–145)

## 2018-05-08 LAB — CBC
HEMATOCRIT: 47 % (ref 40.0–52.0)
HEMOGLOBIN: 15.6 g/dL (ref 13.0–18.0)
MCH: 32.7 pg (ref 26.0–34.0)
MCHC: 33.3 g/dL (ref 32.0–36.0)
MCV: 98.3 fL (ref 80.0–100.0)
Platelets: 140 10*3/uL — ABNORMAL LOW (ref 150–440)
RBC: 4.78 MIL/uL (ref 4.40–5.90)
RDW: 15 % — ABNORMAL HIGH (ref 11.5–14.5)
WBC: 7.8 10*3/uL (ref 3.8–10.6)

## 2018-05-08 NOTE — Progress Notes (Signed)
Patient does not want any medications prior to discharge. Madlyn Frankel, RN

## 2018-05-08 NOTE — Progress Notes (Signed)
Discharge instructions given and went over with patient at bedside. All questions answered. Patient discharged home with wife. Jaiona Simien S, RN  

## 2018-05-08 NOTE — Evaluation (Signed)
Occupational Therapy Evaluation Patient Details Name: Taylor [name] MRN: [id number] DOB: May 23, 1941 Today's Date: 05/08/2018    History of Present Illness 77 y.o. male with a significant cardiac history who reports that on 6/12 while at home had the acute onset of neck pain.  Patient then had the sensation as if his skin was burnt on the R side of his neck.  Tingling then developed in his fingertips on his right hand.  Symptoms did not resolve overnight and patient presented for evaluation on 6/13.   Clinical Impression   Pt seen for OT evaluation this date. Prior to hospital admission, pt was independent and active. Pt lives with his spouse and endorses 1 fall approx 4 weeks ago (fell roller skating and bruised his R hand, did not hit his head). Pt does endorse a traumatic fall in 1994 off of a ladder where he his his head and "cracked my skull on the L side and ruptured my C6-C7 vertebrae." Pt states he thought about this last night and has not relayed this history to his MD. RN notified.  Currently pt demonstrates no functional impairments, only reports tingling in R fingers and pain to touch on R side of neck. Educated pt on joint/skin protection strategies. Pt verbalized understanding. No additional skilled acute OT needs at this time. Will sign off. Please re-consult if additional needs arise.     Follow Up Recommendations  No OT follow up    Equipment Recommendations  None recommended by OT    Recommendations for Other Services       Precautions / Restrictions Precautions Precautions: None Restrictions Weight Bearing Restrictions: No      Mobility Bed Mobility Overal bed mobility: Independent             General bed mobility comments: no difficulty or safety deficits  Transfers Overall transfer level: Independent Equipment used: None             General transfer comment: no difficulty or safety deficits    Balance Overall balance assessment: Independent                                          ADL either performed or assessed with clinical judgement   ADL Overall ADL's : Independent                                             Vision Patient Visual Report: No change from baseline       Perception     Praxis      Pertinent Vitals/Pain Pain Assessment: 0-10 Pain Score: 5  Pain Location: R side of neck - "feels like a sunburn" Pain Descriptors / Indicators: Burning;Tender Pain Intervention(s): Limited activity within patient's tolerance;Monitored during session     Hand Dominance Right   Extremity/Trunk Assessment Upper Extremity Assessment Upper Extremity Assessment: Overall WFL for tasks assessed;RUE deficits/detail(5/5 strength bilaterally) RUE Deficits / Details: R hand tingling in fingers, grip, coordination, light touch, and proprioception intact with testing, no functional deficits   Lower Extremity Assessment Lower Extremity Assessment: Overall WFL for tasks assessed   Cervical / Trunk Assessment Cervical / Trunk Assessment: Normal   Communication Communication Communication: No difficulties   Cognition Arousal/Alertness: Awake/alert Behavior During Therapy: WFL for tasks assessed/performed Overall  Cognitive Status: Within Functional Limits for tasks assessed                                     General Comments  psoriasis per pt report    Exercises Other Exercises Other Exercises: Pt educated in joint/skin protection for R hand    Shoulder Instructions      Home Living Family/patient expects to be discharged to:: Private residence Living Arrangements: Spouse/significant other Available Help at Discharge: Family;Available 24 hours/day Type of Home: House Home Access: Stairs to enter [employer] of Steps: 3 Entrance Stairs-Rails: Right;Left;Can reach both Home Layout: One level     Bathroom Shower/Tub: Teacher, early years/pre:  Standard     Home Equipment: None          Prior Functioning/Environment Level of Independence: Independent        Comments: Pt indep in all aspects, 1 fall in past 12 months approx 4 wks ago (fell roller skating and bruised R hand). 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)        OT Problem List:        OT Treatment/Interventions:      OT Goals(Current goals can be found in the care plan section) Acute Rehab OT Goals Patient Stated Goal: have less neck pain OT Goal Formulation: All assessment and education complete, DC therapy  OT Frequency:     Barriers to D/C:            Co-evaluation              AM-PAC PT "6 Clicks" Daily Activity     Outcome Measure Help from another person eating meals?: None Help from another person taking care of personal grooming?: None Help from another person toileting, which includes using toliet, bedpan, or urinal?: None Help from another person bathing (including washing, rinsing, drying)?: None Help from another person to put on and taking off regular upper body clothing?: None Help from another person to put on and taking off regular lower body clothing?: None 6 Click Score: 24   End of Session    Activity Tolerance: Patient tolerated treatment well Patient left: in chair;with call bell/phone within reach  OT Visit Diagnosis: Other symptoms and signs involving the nervous system (R29.898)                Time: 6270-3500 OT Time Calculation (min): 27 min Charges:  OT General Charges $OT Visit: 1 Visit OT Evaluation $OT Eval Low Complexity: 1 Low  Jeni Salles, MPH, MS, OTR/L ascom [phone] 05/08/18, 10:36 AM

## 2018-05-08 NOTE — Discharge Summary (Signed)
[facility] NAME: Taylor [name]    MR#:  [id number]  DATE OF BIRTH:  [birth year]  DATE OF ADMISSION:  05/07/2018 ADMITTING PHYSICIAN: Gladstone Lighter, MD  DATE OF DISCHARGE: 05/08/2018   PRIMARY CARE PHYSICIAN: Birdie Sons, MD    ADMISSION DIAGNOSIS:  Numbness [R20.0]  DISCHARGE DIAGNOSIS:  Active Problems:   Numbness   SECONDARY DIAGNOSIS:   Past Medical History:  Diagnosis Date  . AAA (abdominal aortic aneurysm) [facility]) 06/03/2007   [facility]; Dr. Kellie Simmering  . AICD (automatic cardioverter/defibrillator) present   . Barrett's esophagus   . Bladder cancer ([place])   . Bradycardia   . CAD (coronary artery disease)   . CHF (congestive heart failure) ([place])   . Cluster headache   . DDD (degenerative disc disease), lumbar   . Dyspnea    WITH EXERTION  . Edema    LEFT ANKLE  . Fracture of skull base ([place]) 1997   due to fall  . GERD (gastroesophageal reflux disease)   . Gout   . History of bladder cancer 12/1995  . Hyperlipidemia   . Hypertension   . Malignant melanoma ([place]) 12/2012   right dorsal forearm excised  . Myocardial infarction ([place])    LAST 2014  . Osteoarthritis of knee   . Pacemaker 10/10/2006  . Pneumonia    2016  . Pre-diabetes   . Psoriasis   . Rib fracture 1997   due to fall  . Sleep apnea    CPAP  . Stroke ([place])   . Venous incompetence      HOSPITAL COURSE:   77 yo male w/ past medical history of hypertension, obstructive sleep apnea, hyperlipidemia, history of melanoma, osteoarthritis, status post pacemaker, prediabetes, GERD who presented to the hospital due to right upper extremity numbness.  1.  TIA/CVA- patient presented to the hospital with the right upper extremity numbness which was thought to be secondary to suspected stroke. - Patient underwent an extensive work-up including CT head which was negative for acute CVA.  Patient could not have an MRI as he had a  pacemaker.  Patient underwent a echocardiogram which showed no evidence of cardiac thrombus, patient also had a carotid duplex which was negative for acute pathology.  He still has some paresthesias in his hand which likely at coming from his cervical/neck disease from his osteoarthritis. -Patient will follow up with neurology as an outpatient.  2.  Essential hypertension- cont. Coreg, Losartan  3. Hyperlipidemia - cont. Atorvastatin  4. Hx of gout - cont. Allopurinol.   5.  GERD-patient will continue Protonix.  DISCHARGE CONDITIONS:   Stable  CONSULTS OBTAINED:  Treatment Team:  Catarina Hartshorn, MD  DRUG ALLERGIES:   Allergies  Allergen Reactions  . Amlodipine Besylate Swelling    Had a reaction when taking with colcrys   . Crestor [Rosuvastatin]     Muscle cramps and pain  . Rocephin [Ceftriaxone]     unknown    DISCHARGE MEDICATIONS:   Allergies as of 05/08/2018      Reactions   Amlodipine Besylate Swelling   Had a reaction when taking with colcrys    Crestor [rosuvastatin]    Muscle cramps and pain   Rocephin [ceftriaxone]    unknown      Medication List    TAKE these medications   allopurinol 300 MG tablet Commonly known as:  ZYLOPRIM TAKE 1 TABLET BY MOUTH EVERY DAY   aspirin 81  MG EC tablet Take 81 mg by mouth daily.   atorvastatin 40 MG tablet Commonly known as:  LIPITOR TAKE 1 TABLET BY MOUTH EVERY DAY   carvedilol 12.5 MG tablet Commonly known as:  COREG TAKE 1 TABLET BY MOUTH DAILY   furosemide 20 MG tablet Commonly known as:  LASIX TAKE 2-4 TABLETS BY MOUTH DAILY. What changed:  See the new instructions.   GLUCOSAMINE-VITAMIN D PO Take 1 tablet by mouth daily.   indomethacin 50 MG capsule Commonly known as:  INDOCIN TAKE 1 CAPSULE BY MOUTH THREE TIMES DAILY AS NEEDED   losartan 50 MG tablet Commonly known as:  COZAAR TAKE 1 TABLET BY MOUTH DAILY   pantoprazole 40 MG tablet Commonly known as:  PROTONIX TAKE 1 TABLET BY MOUTH  EVERY DAY   triamcinolone ointment 0.1 % Commonly known as:  KENALOG Apply 1 application topically 2 (two) times daily as needed (itching).   Vitamin B-12 CR 1000 MCG Tbcr Take 1,000 mcg by mouth daily.         DISCHARGE INSTRUCTIONS:   DIET:  Cardiac diet  DISCHARGE CONDITION:  Stable  ACTIVITY:  Activity as tolerated  OXYGEN:  Home Oxygen: No.   Oxygen Delivery: room air  DISCHARGE LOCATION:  home   If you experience worsening of your admission symptoms, develop shortness of breath, life threatening emergency, suicidal or homicidal thoughts you must seek medical attention immediately by calling 911 or calling your MD immediately  if symptoms less severe.  You Must read complete instructions/literature along with all the possible adverse reactions/side effects for all the Medicines you take and that have been prescribed to you. Take any new Medicines after you have completely understood and accpet all the possible adverse reactions/side effects.   Please note  You were cared for by a hospitalist during your hospital stay. If you have any questions about your discharge medications or the care you received while you were in the hospital after you are discharged, you can call the unit and asked to speak with the hospitalist on call if the hospitalist that took care of you is not available. Once you are discharged, your primary care physician will handle any further medical issues. Please note that NO REFILLS for any discharge medications will be authorized once you are discharged, as it is imperative that you return to your primary care physician (or establish a relationship with a primary care physician if you do not have one) for your aftercare needs so that they can reassess your need for medications and monitor your lab values.     Today   Still having some right hand paresthesias but no headache no weakness and no other associated symptoms.  Discussed with neurology  and will discharge home as work-up has been negative and follow-up with neurology as an outpatient  VITAL SIGNS:  Blood pressure (!) 146/82, pulse (!) 54, temperature (!) 97.4 F (36.3 C), temperature source Oral, resp. rate 20, height 5\' 10"  (1.778 m), weight 98.7 kg (217 lb 8 oz), SpO2 97 %.  I/O:    Intake/Output Summary (Last 24 hours) at 05/08/2018 1340 Last data filed at 05/08/2018 0921 Gross per 24 hour  Intake 720 ml  Output -  Net 720 ml    PHYSICAL EXAMINATION:  GENERAL:  77 y.o.-year-old patient lying in the bed with no acute distress.  EYES: Pupils equal, round, reactive to light and accommodation. No scleral icterus. Extraocular muscles intact.  HEENT: Head atraumatic, normocephalic. Oropharynx and nasopharynx clear.  NECK:  Supple, no jugular venous distention. No thyroid enlargement, no tenderness.  LUNGS: Normal breath sounds bilaterally, no wheezing, rales,rhonchi. No use of accessory muscles of respiration.  CARDIOVASCULAR: S1, S2 normal. No murmurs, rubs, or gallops.  ABDOMEN: Soft, non-tender, non-distended. Bowel sounds present. No organomegaly or mass.  EXTREMITIES: No pedal edema, cyanosis, or clubbing.  NEUROLOGIC: Cranial nerves II through XII are intact. No focal motor or sensory defecits b/l. Paresthesias of Right hand.  PSYCHIATRIC: The patient is alert and oriented x 3.  SKIN: No obvious rash, lesion, or ulcer.   DATA REVIEW:   CBC Recent Labs  Lab 05/08/18 0406  WBC 7.8  HGB 15.6  HCT 47.0  PLT 140*    Chemistries  Recent Labs  Lab 05/07/18 0913 05/08/18 0406  NA 140 140  K 3.7 3.4*  CL 105 103  CO2 29 27  GLUCOSE 167* 98  BUN 28* 28*  CREATININE 1.30* 1.30*  CALCIUM 8.9 8.8*  AST 29  --   ALT 19  --   ALKPHOS 102  --   BILITOT 1.2  --     Cardiac Enzymes Recent Labs  Lab 05/07/18 0913  TROPONINI <0.03    Microbiology Results  No results found for this or any previous visit.  RADIOLOGY:  Ct Head Wo Contrast  Result  Date: 05/07/2018 CLINICAL DATA:  Patient states "yesterday around two I started feeling a kink in my neck and my Right hand started tingling yesterday afternoon. When I went to sleep last night and my hand/fingers still felt tingling when I woke up is why I came.*comment was truncated* EXAM: CT HEAD WITHOUT CONTRAST TECHNIQUE: Contiguous axial images were obtained from the base of the skull through the vertex without intravenous contrast. COMPARISON:  PET-CT 06/19/2011 FINDINGS: Brain: No acute intracranial hemorrhage. No focal mass lesion. No CT evidence of acute infarction. No midline shift or mass effect. No hydrocephalus. Basilar cisterns are patent. Remote infarction in the medial LEFT occipital lobe There are periventricular and subcortical white matter hypodensities. Generalized cortical atrophy. Vascular: No hyperdense vessel or unexpected calcification. Skull: Normal. Negative for fracture or focal lesion. Sinuses/Orbits: Opacification of the LEFT seen ovoid hemi sinus. Mucosal thickening in the LEFT maxillary sinus with air-fluid level. Frontal sinuses are clear. Other: None IMPRESSION: 1. No acute intracranial findings. 2. Atrophy and mild white matter matter microvascular disease. 3. LEFT maxillary sinusitis. Electronically Signed   By: Suzy Bouchard M.D.   On: 05/07/2018 09:27   Ct Cervical Spine Wo Contrast  Result Date: 05/07/2018 CLINICAL DATA:  Right upper extremity radicular symptoms EXAM: CT CERVICAL SPINE WITHOUT CONTRAST TECHNIQUE: Multidetector CT imaging of the cervical spine was performed without intravenous contrast. Multiplanar CT image reconstructions were also generated. COMPARISON:  None. FINDINGS: Alignment: There is no appreciable spondylolisthesis. Skull base and vertebrae: Skull base and craniocervical junction regions appear normal. There is no evident fracture. No blastic or lytic bone lesions. Soft tissues and spinal canal: Prevertebral soft tissues and predental space  regions are normal. No paraspinous lesions are evident. There is no cord or canal hematoma. Disc levels: There is severe disc space narrowing at C6-7 and C7-T1. There is moderately severe disc space narrowing at C5-6. There is slight disc space narrowing at C4-5. At C2-3, there is mild facet hypertrophy bilaterally. No nerve root edema or effacement. No disc extrusion or stenosis. At C3-4, there is moderate facet hypertrophy on the left and mild facet hypertrophy on the right. There is exit foraminal narrowing  on the left with impression on the exiting nerve root at the exit foramen due to bony hypertrophy. No disc extrusion or stenosis at this level. At C4-5, there is moderate facet hypertrophy on the left and mild facet hypertrophy on the right. There is no appreciable nerve root edema or effacement. No disc extrusion or stenosis. At C5-6, there is moderate facet hypertrophy bilaterally. There is bony overgrowth bilaterally at this level causing effacement of the exiting nerve roots bilaterally at the respective exit foramina. No disc extrusion or stenosis. At C6-7, there is moderate facet hypertrophy bilaterally. There is bony hypertrophy impressing upon both exiting nerve roots causing effacement of the nerve roots bilaterally at the exit foraminal levels, similar to changes at C5-6. No disc extrusion or stenosis evident. Note that there is a moderate central disc protrusion at C5-6 which abuts but does not significantly efface the thecal sac. At C7-T1, there is moderate facet hypertrophy bilaterally. No nerve root edema or effacement. No disc extrusion or stenosis. Upper chest: Visualized upper lung regions are clear. There is aortic atherosclerosis. Other: There are foci of calcification in each subclavian artery as well as in the carotid arteries bilaterally. IMPRESSION: 1. Multilevel arthropathy. There is nerve root effacement due to bony hypertrophy at C5-6 and C6-7 bilaterally. No frank disc extrusion or  stenosis. 2.  No evident fracture or spondylolisthesis. 3. Aortic atherosclerosis. Foci of calcification in carotid and subclavian arteries bilaterally. Aortic Atherosclerosis (ICD10-I70.0). Electronically Signed   By: Lowella Grip III M.D.   On: 05/07/2018 10:19   US Carotid Bilateral  Result Date: 05/08/2018 CLINICAL DATA:  Stroke.  Hypertension, coronary artery disease EXAM: BILATERAL CAROTID DUPLEX ULTRASOUND TECHNIQUE: Pearline Cables scale imaging, color Doppler and duplex ultrasound was performed of bilateral carotid and vertebral arteries in the neck. COMPARISON:  None. TECHNIQUE: Quantification of carotid stenosis is based on velocity parameters that correlate the residual internal carotid diameter with NASCET-based stenosis levels, using the diameter of the distal internal carotid lumen as the denominator for stenosis measurement. The following velocity measurements were obtained: PEAK SYSTOLIC/END DIASTOLIC RIGHT ICA:                     86/26cm/sec CCA:                     72/53GU/YQI SYSTOLIC ICA/CCA RATIO:  1.7 ECA:                     40cm/sec LEFT ICA:                     90/28cm/sec CCA:                     34/74QV/ZDG SYSTOLIC ICA/CCA RATIO:  1.6 ECA:                     64cm/sec FINDINGS: RIGHT CAROTID ARTERY: Mild noncalcified plaque in the distal common carotid artery and bulb. No significant stenosis. Normal waveforms and color Doppler signal. Mild tortuosity of the ICA. RIGHT VERTEBRAL ARTERY:  Normal flow direction and waveform. LEFT CAROTID ARTERY: Eccentric calcified plaque in the bulb without high-grade stenosis. Normal waveforms and color Doppler signal. Mild tortuosity. LEFT VERTEBRAL ARTERY: Normal flow direction and waveform. IMPRESSION: 1. Mild bilateral carotid bifurcation plaque resulting in less than 50% diameter stenosis. 2.  Antegrade bilateral vertebral arterial flow. Electronically Signed   By: Lucrezia Europe M.D.   On: 05/08/2018 08:06  Management plans discussed with the  patient, family and they are in agreement.  CODE STATUS:     Code Status Orders  (From admission, onward)        Start     Ordered   05/07/18 1211  Full code  Continuous     05/07/18 1210    Code Status History    This patient has a current code status but no historical code status.      TOTAL TIME TAKING CARE OF THIS PATIENT: 40 minutes.    Henreitta Leber M.D on 05/08/2018 at 1:40 PM  Between 7am to 6pm - Pager - [phone]  After 6pm go to [email or web address] - Proofreader  [facility]  Office  [phone]  CC: Primary care physician; Birdie Sons, MD

## 2018-05-08 NOTE — Evaluation (Signed)
Physical Therapy Evaluation Patient Details Name: Taylor [name] MRN: [id number] DOB: Jun 28, 1941 Today's Date: 05/08/2018   History of Present Illness  77 y.o. male with a significant cardiac history who reports that on 6/12 while at home had the acute onset of neck pain.  Patient then had the sensation as if his skin was burnt on the R side of his neck.  Tingling then developed in his fingertips on his right hand.  Symptoms did not resolve overnight and patient presented for evaluation on 6/13.  Clinical Impression  Pt is a pleasant 77 year old male who was admitted for possible CVA symptoms. Pt performs bed mobility, transfers, and ambulation with independence. Does report decreased sensation on R fingers. Coordination intact B UE/LE. Pt demonstrates all bed mobility/transfers/ambulation at baseline level. Pt does not require any further PT needs at this time. Pt will be dc in house and does not require follow up. RN aware. Will dc current orders.       Follow Up Recommendations No PT follow up    Equipment Recommendations  None recommended by PT    Recommendations for Other Services       Precautions / Restrictions Precautions Precautions: None Restrictions Weight Bearing Restrictions: No      Mobility  Bed Mobility Overal bed mobility: Independent             General bed mobility comments: not performed as pt received in chair  Transfers Overall transfer level: Independent Equipment used: None             General transfer comment: no difficulty or safety deficits  Ambulation/Gait Ambulation/Gait assistance: Independent Gait Distance (Feet): 60 Feet Assistive device: None Gait Pattern/deviations: WFL(Within Functional Limits)     General Gait Details: safe technique and reciprocal gait pattern. No issues  Stairs            Wheelchair Mobility    Modified Rankin (Stroke Patients Only)       Balance Overall balance assessment: Independent                                            Pertinent Vitals/Pain Pain Assessment: No/denies pain(only reports pain while it is touched) Pain Score: 5  Pain Location: R side of neck - "feels like a sunburn" Pain Descriptors / Indicators: Burning;Tender Pain Intervention(s): Limited activity within patient's tolerance;Monitored during session    Home Living Family/patient expects to be discharged to:: Private residence Living Arrangements: Spouse/significant other Available Help at Discharge: Family;Available 24 hours/day Type of Home: House Home Access: Stairs to enter Entrance Stairs-Rails: Right;Left;Can reach both Entrance Stairs-Number of Steps: 3 Home Layout: One level Home Equipment: None      Prior Function Level of Independence: Independent         Comments: Pt indep in all aspects, 1 fall in past 12 months approx 4 wks ago (fell roller skating and bruised R hand). 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, newspaper   Dominant Hand: Right    Extremity/Trunk Assessment   Upper Extremity Assessment Upper Extremity Assessment: Overall WFL for tasks assessed RUE Deficits / Details: tingling noted in fingertips on R hand    Lower Extremity Assessment Lower Extremity Assessment: Overall WFL for tasks assessed    Cervical / Trunk Assessment Cervical / Trunk  Assessment: Normal  Communication   Communication: No difficulties  Cognition Arousal/Alertness: Awake/alert Behavior During Therapy: WFL for tasks assessed/performed Overall Cognitive Status: Within Functional Limits for tasks assessed                                        General Comments General comments (skin integrity, edema, etc.): psoriasis per pt report    Exercises Other Exercises Other Exercises: Pt educated in joint/skin protection for R hand    Assessment/Plan    PT Assessment Patent does not need  any further PT services  PT Problem List         PT Treatment Interventions      PT Goals (Current goals can be found in the Care Plan section)  Acute Rehab PT Goals Patient Stated Goal: to go back to the gym tomorrow PT Goal Formulation: All assessment and education complete, DC therapy Time For Goal Achievement: 05/08/18 Potential to Achieve Goals: Good    Frequency     Barriers to discharge        Co-evaluation               AM-PAC PT "6 Clicks" Daily Activity  Outcome Measure Difficulty turning over in bed (including adjusting bedclothes, sheets and blankets)?: None Difficulty moving from lying on back to sitting on the side of the bed? : None Difficulty sitting down on and standing up from a chair with arms (e.g., wheelchair, bedside commode, etc,.)?: None Help needed moving to and from a bed to chair (including a wheelchair)?: None Help needed walking in hospital room?: None Help needed climbing 3-5 steps with a railing? : None 6 Click Score: 24    End of Session   Activity Tolerance: Patient tolerated treatment well Patient left: in chair Nurse Communication: Mobility status PT Visit Diagnosis: Unsteadiness on feet (R26.81)    Time: 8372-9021 PT Time Calculation (min) (ACUTE ONLY): 12 min   Charges:   PT Evaluation $PT Eval Low Complexity: 1 Low     PT G CodesGreggory Stallion, PT, DPT [phone]   [name] 05/08/2018, 12:54 PM

## 2018-05-08 NOTE — Progress Notes (Signed)
Subjective: Patient continues to have finger paresthesias.  No new neurological complaints.    Objective: Current vital signs: BP (!) 146/82 (BP Location: Right Arm)   Pulse (!) 54   Temp (!) 97.4 F (36.3 C) (Oral)   Resp 20   Ht 5\' 10"  (1.778 m)   Wt 98.7 kg (217 lb 8 oz)   SpO2 97%   BMI 31.21 kg/m  Vital signs in last 24 hours: Temp:  [97.4 F (36.3 C)-98.2 F (36.8 C)] 97.4 F (36.3 C) (06/14 1234) Pulse Rate:  [50-72] 54 (06/14 1234) Resp:  [16-20] 20 (06/14 1234) BP: (114-151)/(74-92) 146/82 (06/14 1234) SpO2:  [93 %-100 %] 97 % (06/14 1234)  Intake/Output from previous day: 06/13 0701 - 06/14 0700 In: 480 [P.O.:480] Out: -  Intake/Output this shift: Total I/O In: 240 [P.O.:240] Out: -  Nutritional status:  Diet Order           Diet - low sodium heart healthy        Diet Heart Room service appropriate? Yes; Fluid consistency: Thin  Diet effective now          Neurologic Exam: Mental Status: Alert, oriented, thought content appropriate.  Speech fluent without evidence of aphasia.  Able to follow 3 step commands without difficulty. Cranial Nerves: II: Discs flat bilaterally; Visual fields grossly normal, pupils equal, round, reactive to light and accommodation III,IV, VI: ptosis not present, extra-ocular motions intact bilaterally V,VII: smile symmetric, facial light touch sensation normal bilaterally VIII: hearing normal bilaterally IX,X: gag reflex present XI: bilateral shoulder shrug XII: midline tongue extension Motor: 5/5 throughout Sensory: Pinprick and light touch decreased on the fingertips on the right hand, thumb and pinky less so than the other digits   Lab Results: Basic Metabolic Panel: Recent Labs  Lab 05/07/18 0913 05/08/18 0406  NA 140 140  K 3.7 3.4*  CL 105 103  CO2 29 27  GLUCOSE 167* 98  BUN 28* 28*  CREATININE 1.30* 1.30*  CALCIUM 8.9 8.8*    Liver Function Tests: Recent Labs  Lab 05/07/18 0913  AST 29  ALT 19   ALKPHOS 102  BILITOT 1.2  PROT 7.8  ALBUMIN 4.1   No results for input(s): LIPASE, AMYLASE in the last 168 hours. No results for input(s): AMMONIA in the last 168 hours.  CBC: Recent Labs  Lab 05/07/18 0913 05/08/18 0406  WBC 5.9 7.8  HGB 15.5 15.6  HCT 46.9 47.0  MCV 97.9 98.3  PLT 156 140*    Cardiac Enzymes: Recent Labs  Lab 05/07/18 0913  TROPONINI <0.03    Lipid Panel: Recent Labs  Lab 05/07/18 1240  CHOL 96  TRIG 71  HDL 28*  CHOLHDL 3.4  VLDL 14  LDLCALC 54    CBG: No results for input(s): GLUCAP in the last 168 hours.  Microbiology: No results found for this or any previous visit.  Coagulation Studies: No results for input(s): LABPROT, INR in the last 72 hours.  Imaging: Ct Head Wo Contrast  Result Date: 05/07/2018 CLINICAL DATA:  Patient states "yesterday around two I started feeling a kink in my neck and my Right hand started tingling yesterday afternoon. When I went to sleep last night and my hand/fingers still felt tingling when I woke up is why I came.*comment was truncated* EXAM: CT HEAD WITHOUT CONTRAST TECHNIQUE: Contiguous axial images were obtained from the base of the skull through the vertex without intravenous contrast. COMPARISON:  PET-CT 06/19/2011 FINDINGS: Brain: No acute intracranial hemorrhage.  No focal mass lesion. No CT evidence of acute infarction. No midline shift or mass effect. No hydrocephalus. Basilar cisterns are patent. Remote infarction in the medial LEFT occipital lobe There are periventricular and subcortical white matter hypodensities. Generalized cortical atrophy. Vascular: No hyperdense vessel or unexpected calcification. Skull: Normal. Negative for fracture or focal lesion. Sinuses/Orbits: Opacification of the LEFT seen ovoid hemi sinus. Mucosal thickening in the LEFT maxillary sinus with air-fluid level. Frontal sinuses are clear. Other: None IMPRESSION: 1. No acute intracranial findings. 2. Atrophy and mild white  matter matter microvascular disease. 3. LEFT maxillary sinusitis. Electronically Signed   By: Suzy Bouchard M.D.   On: 05/07/2018 09:27   Ct Cervical Spine Wo Contrast  Result Date: 05/07/2018 CLINICAL DATA:  Right upper extremity radicular symptoms EXAM: CT CERVICAL SPINE WITHOUT CONTRAST TECHNIQUE: Multidetector CT imaging of the cervical spine was performed without intravenous contrast. Multiplanar CT image reconstructions were also generated. COMPARISON:  None. FINDINGS: Alignment: There is no appreciable spondylolisthesis. Skull base and vertebrae: Skull base and craniocervical junction regions appear normal. There is no evident fracture. No blastic or lytic bone lesions. Soft tissues and spinal canal: Prevertebral soft tissues and predental space regions are normal. No paraspinous lesions are evident. There is no cord or canal hematoma. Disc levels: There is severe disc space narrowing at C6-7 and C7-T1. There is moderately severe disc space narrowing at C5-6. There is slight disc space narrowing at C4-5. At C2-3, there is mild facet hypertrophy bilaterally. No nerve root edema or effacement. No disc extrusion or stenosis. At C3-4, there is moderate facet hypertrophy on the left and mild facet hypertrophy on the right. There is exit foraminal narrowing on the left with impression on the exiting nerve root at the exit foramen due to bony hypertrophy. No disc extrusion or stenosis at this level. At C4-5, there is moderate facet hypertrophy on the left and mild facet hypertrophy on the right. There is no appreciable nerve root edema or effacement. No disc extrusion or stenosis. At C5-6, there is moderate facet hypertrophy bilaterally. There is bony overgrowth bilaterally at this level causing effacement of the exiting nerve roots bilaterally at the respective exit foramina. No disc extrusion or stenosis. At C6-7, there is moderate facet hypertrophy bilaterally. There is bony hypertrophy impressing upon  both exiting nerve roots causing effacement of the nerve roots bilaterally at the exit foraminal levels, similar to changes at C5-6. No disc extrusion or stenosis evident. Note that there is a moderate central disc protrusion at C5-6 which abuts but does not significantly efface the thecal sac. At C7-T1, there is moderate facet hypertrophy bilaterally. No nerve root edema or effacement. No disc extrusion or stenosis. Upper chest: Visualized upper lung regions are clear. There is aortic atherosclerosis. Other: There are foci of calcification in each subclavian artery as well as in the carotid arteries bilaterally. IMPRESSION: 1. Multilevel arthropathy. There is nerve root effacement due to bony hypertrophy at C5-6 and C6-7 bilaterally. No frank disc extrusion or stenosis. 2.  No evident fracture or spondylolisthesis. 3. Aortic atherosclerosis. Foci of calcification in carotid and subclavian arteries bilaterally. Aortic Atherosclerosis (ICD10-I70.0). Electronically Signed   By: Lowella Grip III M.D.   On: 05/07/2018 10:19   US Carotid Bilateral  Result Date: 05/08/2018 CLINICAL DATA:  Stroke.  Hypertension, coronary artery disease EXAM: BILATERAL CAROTID DUPLEX ULTRASOUND TECHNIQUE: Pearline Cables scale imaging, color Doppler and duplex ultrasound was performed of bilateral carotid and vertebral arteries in the neck. COMPARISON:  None. TECHNIQUE:  Quantification of carotid stenosis is based on velocity parameters that correlate the residual internal carotid diameter with NASCET-based stenosis levels, using the diameter of the distal internal carotid lumen as the denominator for stenosis measurement. The following velocity measurements were obtained: PEAK SYSTOLIC/END DIASTOLIC RIGHT ICA:                     86/26cm/sec CCA:                     77/82UM/PNT SYSTOLIC ICA/CCA RATIO:  1.7 ECA:                     40cm/sec LEFT ICA:                     90/28cm/sec CCA:                     61/44RX/VQM SYSTOLIC ICA/CCA RATIO:   1.6 ECA:                     64cm/sec FINDINGS: RIGHT CAROTID ARTERY: Mild noncalcified plaque in the distal common carotid artery and bulb. No significant stenosis. Normal waveforms and color Doppler signal. Mild tortuosity of the ICA. RIGHT VERTEBRAL ARTERY:  Normal flow direction and waveform. LEFT CAROTID ARTERY: Eccentric calcified plaque in the bulb without high-grade stenosis. Normal waveforms and color Doppler signal. Mild tortuosity. LEFT VERTEBRAL ARTERY: Normal flow direction and waveform. IMPRESSION: 1. Mild bilateral carotid bifurcation plaque resulting in less than 50% diameter stenosis. 2.  Antegrade bilateral vertebral arterial flow. Electronically Signed   By: Lucrezia Europe M.D.   On: 05/08/2018 08:06    Medications:  I have reviewed the patient's current medications. Scheduled: . allopurinol  300 mg Oral Daily  . aspirin EC  81 mg Oral Daily  . atorvastatin  40 mg Oral Daily  . carvedilol  12.5 mg Oral Daily  . enoxaparin (LOVENOX) injection  40 mg Subcutaneous Q24H  . furosemide  40 mg Oral Daily  . losartan  50 mg Oral Daily  . pantoprazole  40 mg Oral Daily  . vitamin B-12  1,000 mcg Oral Daily    Assessment/Plan: No new neurological complaints.  Work up has been unremarkable and includes a carotid doppler that shows no evidence of hemodynamically significant stenosis.  Echocardiogram shows no cardiac source of emboli with a reduced EF.  A1c 6.3, LDL 54.  Recommendations: 1.  Would continue ASA 2.  Follow up with neurology on an outpatient basis for further evaluation of cervical causes of symptoms  Case discussed with Dr. Verdell Carmine   LOS: 0 days   Alexis Goodell, MD Neurology [phone] 05/08/2018  1:28 PM

## 2018-05-12 DIAGNOSIS — R200 Anesthesia of skin: Secondary | ICD-10-CM | POA: Diagnosis not present

## 2018-05-12 DIAGNOSIS — R202 Paresthesia of skin: Secondary | ICD-10-CM | POA: Diagnosis not present

## 2018-05-27 ENCOUNTER — Other Ambulatory Visit: Payer: Self-pay | Admitting: Family Medicine

## 2018-06-01 ENCOUNTER — Emergency Department
Admission: EM | Admit: 2018-06-01 | Discharge: 2018-06-01 | Disposition: A | Discharge status: Home / Self Care | Payer: Medicare Other | Attending: Emergency Medicine | Admitting: Emergency Medicine

## 2018-06-01 ENCOUNTER — Encounter: Payer: Self-pay | Admitting: Physician Assistant

## 2018-06-01 ENCOUNTER — Emergency Department: Payer: Medicare Other

## 2018-06-01 ENCOUNTER — Other Ambulatory Visit: Payer: Self-pay

## 2018-06-01 ENCOUNTER — Telehealth: Payer: Self-pay

## 2018-06-01 ENCOUNTER — Ambulatory Visit (INDEPENDENT_AMBULATORY_CARE_PROVIDER_SITE_OTHER): Payer: Medicare Other | Admitting: Physician Assistant

## 2018-06-01 VITALS — BP 120/80 | HR 52 | Temp 97.6°F | Resp 16 | Ht 70.0 in | Wt 228.0 lb

## 2018-06-01 DIAGNOSIS — I130 Hypertensive heart and chronic kidney disease with heart failure and stage 1 through stage 4 chronic kidney disease, or unspecified chronic kidney disease: Secondary | ICD-10-CM | POA: Insufficient documentation

## 2018-06-01 DIAGNOSIS — Z87891 Personal history of nicotine dependence: Secondary | ICD-10-CM | POA: Diagnosis not present

## 2018-06-01 DIAGNOSIS — Z85820 Personal history of malignant melanoma of skin: Secondary | ICD-10-CM | POA: Diagnosis not present

## 2018-06-01 DIAGNOSIS — I252 Old myocardial infarction: Secondary | ICD-10-CM | POA: Insufficient documentation

## 2018-06-01 DIAGNOSIS — Z950 Presence of cardiac pacemaker: Secondary | ICD-10-CM | POA: Insufficient documentation

## 2018-06-01 DIAGNOSIS — I2510 Atherosclerotic heart disease of native coronary artery without angina pectoris: Secondary | ICD-10-CM | POA: Insufficient documentation

## 2018-06-01 DIAGNOSIS — I509 Heart failure, unspecified: Secondary | ICD-10-CM | POA: Insufficient documentation

## 2018-06-01 DIAGNOSIS — Z951 Presence of aortocoronary bypass graft: Secondary | ICD-10-CM | POA: Diagnosis not present

## 2018-06-01 DIAGNOSIS — Z8551 Personal history of malignant neoplasm of bladder: Secondary | ICD-10-CM | POA: Diagnosis not present

## 2018-06-01 DIAGNOSIS — I110 Hypertensive heart disease with heart failure: Secondary | ICD-10-CM | POA: Diagnosis not present

## 2018-06-01 DIAGNOSIS — R0602 Shortness of breath: Secondary | ICD-10-CM | POA: Diagnosis not present

## 2018-06-01 DIAGNOSIS — N289 Disorder of kidney and ureter, unspecified: Secondary | ICD-10-CM

## 2018-06-01 DIAGNOSIS — N183 Chronic kidney disease, stage 3 (moderate): Secondary | ICD-10-CM | POA: Insufficient documentation

## 2018-06-01 DIAGNOSIS — M7989 Other specified soft tissue disorders: Secondary | ICD-10-CM | POA: Diagnosis not present

## 2018-06-01 DIAGNOSIS — Z8673 Personal history of transient ischemic attack (TIA), and cerebral infarction without residual deficits: Secondary | ICD-10-CM | POA: Diagnosis not present

## 2018-06-01 LAB — BASIC METABOLIC PANEL
ANION GAP: 8 (ref 5–15)
BUN: 21 mg/dL (ref 8–23)
CALCIUM: 8.4 mg/dL — AB (ref 8.9–10.3)
CHLORIDE: 109 mmol/L (ref 98–111)
CO2: 27 mmol/L (ref 22–32)
Creatinine, Ser: 1.48 mg/dL — ABNORMAL HIGH (ref 0.61–1.24)
GFR calc Af Amer: 52 mL/min — ABNORMAL LOW (ref >60)
GFR calc non Af Amer: 45 mL/min — ABNORMAL LOW (ref >60)
Glucose, Bld: 162 mg/dL — ABNORMAL HIGH (ref 70–99)
Potassium: 3.5 mmol/L (ref 3.5–5.1)
Sodium: 144 mmol/L (ref 135–145)

## 2018-06-01 LAB — CBC
HCT: 41.8 % (ref 40.0–52.0)
HEMOGLOBIN: 14 g/dL (ref 13.0–18.0)
MCH: 32.8 pg (ref 26.0–34.0)
MCHC: 33.6 g/dL (ref 32.0–36.0)
MCV: 97.8 fL (ref 80.0–100.0)
Platelets: 134 10*3/uL — ABNORMAL LOW (ref 150–440)
RBC: 4.28 MIL/uL — AB (ref 4.40–5.90)
RDW: 15.2 % — ABNORMAL HIGH (ref 11.5–14.5)
WBC: 6.8 10*3/uL (ref 3.8–10.6)

## 2018-06-01 LAB — TROPONIN I
Troponin I: 0.03 ng/mL (ref <0.03)
Troponin I: 0.03 ng/mL (ref <0.03)

## 2018-06-01 LAB — BRAIN NATRIURETIC PEPTIDE: B Natriuretic Peptide: 340 pg/mL — ABNORMAL HIGH (ref 0.0–100.0)

## 2018-06-01 MED ORDER — ASPIRIN 81 MG PO CHEW
324.0000 mg | CHEWABLE_TABLET | Freq: Once | ORAL | Status: AC
  Administered 2018-06-01: 324 mg via ORAL
  Filled 2018-06-01: qty 4

## 2018-06-01 MED ORDER — FUROSEMIDE 10 MG/ML IJ SOLN
60.0000 mg | Freq: Once | INTRAMUSCULAR | Status: AC
  Administered 2018-06-01: 60 mg via INTRAVENOUS
  Filled 2018-06-01: qty 8

## 2018-06-01 NOTE — Progress Notes (Signed)
53 Patient: Taylor [name] Male    DOB: 1941-04-20   77 y.o.   MRN: [id number] Visit Date: 06/01/2018  Today's Provider: Trinna Post, PA-C   Chief Complaint  Patient presents with  . Shortness of Breath   Subjective:    HPI   Taylor [name] is a 77 y/o man with history of CHF, Echo on 05/07/2018 EF 20-25%, recent hospitalization for TIA, ventricular fibrillation with pacemaker and defibrillator implanted who presents today for increasing SOB over the past several days. Last weight 05/07/2018 on hospital discharge was 213 lbs and today it is 228 lbs. Patient reports that over the weekend his symptoms worsened, was having more trouble breathing. Patient reports that on Wednesday Dr. Melrose Nakayama neuro advised him to D/C gabapentin and he reports breathing got worse. Patient denies any swelling around legs or ankles. He denies any chest pain.   Filed Weights   06/01/18 1547  Weight: 228 lb (103.4 kg)   Filed Weights   06/01/18 1547  Weight: 228 lb (103.4 kg)       Allergies  Allergen Reactions  . Amlodipine Besylate Swelling    Had a reaction when taking with colcrys   . Crestor [Rosuvastatin]     Muscle cramps and pain  . Rocephin [Ceftriaxone]     unknown     Current Outpatient Medications:  .  allopurinol (ZYLOPRIM) 300 MG tablet, TAKE 1 TABLET BY MOUTH EVERY DAY, Disp: 90 tablet, Rfl: 2 .  aspirin 81 MG EC tablet, Take 81 mg by mouth daily. , Disp: , Rfl:  .  atorvastatin (LIPITOR) 40 MG tablet, TAKE 1 TABLET BY MOUTH EVERY DAY, Disp: 90 tablet, Rfl: 2 .  carvedilol (COREG) 12.5 MG tablet, TAKE 1 TABLET BY MOUTH DAILY, Disp: 60 tablet, Rfl: 5 .  Cyanocobalamin (VITAMIN B-12 CR) 1000 MCG TBCR, Take 1,000 mcg by mouth daily. , Disp: , Rfl:  .  furosemide (LASIX) 20 MG tablet, Take 2 tablets (40 mg total) by mouth daily., Disp: 180 tablet, Rfl: 4 .  GLUCOSAMINE-VITAMIN D PO, Take 1 tablet by mouth daily., Disp: , Rfl:  .  indomethacin (INDOCIN) 50 MG capsule, TAKE 1 CAPSULE  BY MOUTH THREE TIMES DAILY AS NEEDED, Disp: 270 capsule, Rfl: 1 .  losartan (COZAAR) 50 MG tablet, TAKE 1 TABLET BY MOUTH DAILY, Disp: 90 tablet, Rfl: 4 .  pantoprazole (PROTONIX) 40 MG tablet, TAKE 1 TABLET BY MOUTH EVERY DAY, Disp: 90 tablet, Rfl: 4 .  triamcinolone ointment (KENALOG) 0.1 %, Apply 1 application topically 2 (two) times daily as needed (itching)., Disp: 30 g, Rfl: 3  Review of Systems  Constitutional: Positive for unexpected weight change.  Respiratory: Positive for shortness of breath.     Social History   Tobacco Use  . Smoking status: Former Smoker    Packs/day: 1.00    Types: Cigarettes    Last attempt to quit: 11/26/1999    Years since quitting: 18.5  . Smokeless tobacco: Never Used  Substance Use Topics  . Alcohol use: No   Objective:   BP 120/80 (BP Location: Left Arm, Patient Position: Sitting, Cuff Size: Normal)   Pulse (!) 52   Temp 97.6 F (36.4 C) (Oral)   Resp 16   Ht 5\' 10"  (1.778 m)   Wt 228 lb (103.4 kg)   SpO2 92%   BMI 32.71 kg/m  Vitals:   06/01/18 1547  BP: 120/80  Pulse: (!) 52  Resp: 16  Temp: 97.6  F (36.4 C)  TempSrc: Oral  SpO2: 92%  Weight: 228 lb (103.4 kg)  Height: 5\' 10"  (1.778 m)     Physical Exam  Constitutional: He is oriented to person, place, and time. He appears well-developed and well-nourished.  Cardiovascular: An irregular rhythm present. Bradycardia present. Exam reveals distant heart sounds.  Pulmonary/Chest: He exhibits mass. He exhibits no tenderness.  Patient is SOB, work of breathing is increased and he is having difficulty talking in complete sentences. Breath sounds are distant in all lung fields.   Abdominal: Soft. Bowel sounds are normal. He exhibits no distension and no mass. There is no tenderness.  Neurological: He is alert and oriented to person, place, and time.  Skin: Skin is warm and dry.  Psychiatric: He has a normal mood and affect. His behavior is normal.        Assessment & Plan:      1. Acute on chronic congestive heart failure, unspecified heart failure type [facility])  Patient has had a 76 lb weight gain over the past two weeks. He is clearly SOB today. There is ventricular ectopy on his EKG. Unclear whether he is compliant with medications. He hasn't seen cardiology in >1 year despite having been instructed to follow up in 4 weeks after last OV.  Currently on losartan, coreg, and Lasix. Looks like he should be on aldactone. He denies chest pain. Offered to call an ambulance for him. He declines, states he will drive himself to the ER at [facility]. I have called ahead to let them know of his arrival. He will need to see cardiology after his discharge.  2. Shortness of breath  - EKG 12-Lead  Return if symptoms worsen or fail to improve.  The entirety of the information documented in the History of Present Illness, Review of Systems and Physical Exam were personally obtained by me. Portions of this information were initially documented by Lynford Humphrey, CMA and reviewed by me for thoroughness and accuracy.   I have spent 25 minutes with this patient, >50% of which was spent on counseling and coordination of care.       Trinna Post, PA-C  [facility]

## 2018-06-01 NOTE — Patient Instructions (Signed)
Heart Failure Exacerbation  Heart failure is a condition in which the heart does not fill up with enough blood, and therefore does not pump enough blood and oxygen to the body. When this happens, parts of the body do not get the blood and oxygen they need to function properly. This can cause symptoms such as breathing problems, fatigue, swelling, and confusion. Heart failure exacerbation refers to heart failure symptoms that get worse. The symptoms may get worse suddenly or develop slowly over time. Heart failure exacerbation is a serious medical problem that should be treated right away. What are the causes? A heart failure exacerbation can be triggered by:  Not taking your heart failure medicines correctly.  Infections.  Eating an unhealthy diet or a diet that is high in salt (sodium).  Drinking too much fluid.  Drinking alcohol.  Taking illegal drugs, such as cocaine or methamphetamine.  Not exercising.  Other causes include:  Other heart conditions such as an irregular heartbeat (arrhythmia).  Anemia.  Other medical problems, such as kidney failure.  Sometimes the cause of the exacerbation is not known. What are the signs or symptoms? When heart failure symptoms suddenly or slowly get worse, this may be a sign of heart failure exacerbation. Symptoms of heart failure include:  Breathing problems or shortness of breath.  Chronic coughing or wheezing.  Fatigue.  Nausea or lack of appetite.  Feeling light-headed.  Confusion or memory loss.  Increased heart rate or irregular heartbeat.  Buildup of fluid in the legs, ankles, feet, or abdomen.  Difficulty breathing when lying down.  How is this diagnosed? This condition is diagnosed based on:  Your symptoms and medical history.  A physical exam.  You may also have tests, including:  Electrocardiogram (ECG). This test measures the electrical activity of your heart.  Echocardiogram. This test uses sound waves  to take a picture of your heart to see how well it works.  Blood tests.  Imaging tests, such as: ? Chest X-ray. ? MRI. ? Ultrasound.  Stress test. This test examines how well your heart functions when you exercise. Your heart is monitored while you exercise on a treadmill or exercise bike. If you cannot exercise, medicines may be used to increase your heartbeat in place of exercise.  Cardiac catheterization. During this test, a thin, flexible tube (catheter) is inserted into a blood vessel and threaded up to your heart. This test allows your health care provider to check the arteries that lead to your heart (coronary arteries).  Right heart catheterization. During this test, the pressure in your heart is measured.  How is this treated? This condition may be treated by:  Adjusting your heart medicines.  Maintaining a healthy lifestyle. This includes: ? Eating a heart-healthy diet that is low in sodium. ? Not using any products that contain nicotine or tobacco, such as cigarettes and e-cigarettes. ? Regular exercise. ? Monitoring your fluid intake. ? Monitoring your weight and reporting changes to your health care provider.  Treating sleep apnea, if you have this condition.  Surgery. This may include: ? Implanting a device that helps both sides of your heart contract at the same time (cardiac resynchronization therapy device). This can help with heart function and relieve heart failure symptoms. ? Implanting a device that can correct heart rhythm problems (implantable cardioverter defibrillator). ? Connecting a device to your heart to help it pump blood (ventricular assist device). ? Heart transplant.  Follow these instructions at home: Medicines  Take over-the-counter and prescription  medicines only as told by your health care provider.  Do not stop taking your medicines or change the amount you take. If you are having problems or side effects from your medicines, talk to your  health care provider.  If you are having difficulty paying for your medicines, contact a social worker or your clinic. There are many programs to assist with medicine costs.  Talk to your health care provider before starting any new medicines or supplements.  Make sure your health care provider and pharmacist have a list of all the medicines you are taking. Eating and drinking  Avoid drinking alcohol.  Eat a heart-healthy diet as told by your health care provider. This includes: ? Plenty of fruits and vegetables. ? Lean proteins. ? Low-fat dairy. ? Whole grains. ? Foods that are low in sodium. Activity  Exercise regularly as told by your health care provider. Balance exercise with rest.  Ask your health care provider what activities are safe for you. This includes sexual activity, exercise, and daily tasks at home or work. Lifestyle  Do not use any products that contain nicotine or tobacco, such as cigarettes and e-cigarettes. If you need help quitting, ask your health care provider.  Maintain a healthy weight. Ask your health care provider what weight is healthy for you.  Consider joining a patient support group. This can help with emotional problems you may have, such as stress and anxiety. General instructions  Talk to your health care provider about flu and pneumonia vaccines.  Keep a list of medicines that you are taking. This may help in emergency situations.  Keep all follow-up visits as told by your health care provider. This is important. Contact a health care provider if:  You have questions about your medicines or you miss a dose.  You feel anxious, depressed, or stressed.  You have swelling in your feet, ankles, legs, or abdomen.  You have shortness of breath during activity or exercise.  You have a cough.  You have a fever.  You have trouble sleeping.  You gain 2-3 lb (1-1.4 kg) in 24 hours or 5 lb (2.3 kg) in a week. Get help right away if:  You  have chest pain.  You have shortness of breath while resting.  You have severe fatigue.  You are confused.  You have severe dizziness.  You have a rapid or irregular heartbeat.  You have nausea or you vomit.  You have a cough that is worse at night or you cannot lie flat.  You have a cough that will not go away.  You have severe depression or sadness. Summary  When heart failure symptoms get worse, it is called heart failure exacerbation.  Common causes of this condition include taking medicines incorrectly, infections, and drinking alcohol.  This condition may be treated by adjusting medicines, maintaining a healthy lifestyle, or surgery.  Do not stop taking your medicines or change the amount you take. If you are having problems or side effects from your medicines, talk to your health care provider. This information is not intended to replace advice given to you by your health care provider. Make sure you discuss any questions you have with your health care provider. Document Released: 03/25/2017 Document Revised: 03/25/2017 Document Reviewed: 03/25/2017 Elsevier Interactive Patient Education  2018 [employer].

## 2018-06-01 NOTE — ED Notes (Signed)
Pt stating SOB since last Wednesday. Pt stating that his sx have gotten better since Wednesday but he was concerned because it was still there. Pt in NAD at this time. Pt denying any edema in lower extremity.

## 2018-06-01 NOTE — Telephone Encounter (Signed)
Patient in office

## 2018-06-01 NOTE — ED Triage Notes (Signed)
Pt to ER via POV c/o SOB X 1 week-denies hx of breathing problems. Denies any recent cough, congestion. Denies CP. Ambulates well. Pt alert and oriented X4, active, cooperative, pt in NAD. RR even and unlabored, color WNL.

## 2018-06-01 NOTE — Telephone Encounter (Signed)
Tried to call to triage, per Adriana. NA

## 2018-06-01 NOTE — Discharge Instructions (Addendum)
Make an appointment to see Dr. Nehemiah Massed, your cardiologist for reevaluation.  Please continue to take your Lasix as prescribed.  Please have Dr. Nehemiah Massed or your primary care physician recheck your kidney function, as it was slightly abnormal today and this can worsen when we take fluid out of your system to help with your congestive heart failure.  Department if you develop chest pain, lightheadedness or fainting, shortness of breath, palpitations, or any other symptoms concerning to you.

## 2018-06-01 NOTE — ED Notes (Signed)
Pt's walking pulse O2 between 92%-95% on RA

## 2018-06-01 NOTE — ED Provider Notes (Signed)
[facility] Emergency Department Provider Note  ____________________________________________  Time seen: Approximately 8:15 PM  I have reviewed the triage vital signs and the nursing notes.   HISTORY  Chief Complaint Shortness of Breath    HPI Taylor [name] is a 77 y.o. male w/ a hx of CHFwith an EF of 20 to 25%, status post AICD, CAD status post MI, presenting for shortness of breath.  Patient reports that he has had an 18 pound weight gain over the last 2 weeks, which started after he was put on gabapentin, so this medication was discontinued but he continued to gain weight.  The patient reports that between Wednesday and Friday of last week, he felt short of breath when he sat up and had his chronic back pain.  His shortness of breath is better when he walks around.  Since Friday, the patient's shortness of breath has improved but he still continues to be symptomatic.  On Saturday he did have a brief episode of lightheadedness without syncope.  The patient has noted no change in his chronic left greater than right lower extremity swelling, which has always been present since his CABG as veins were harvested from the left lower extremity.  He denies any cough or cold symptoms, fevers or chills, chest pain, palpitations.  Has continued to take his daily Lasix at 40 mg in the morning.  He follows with Dr. Nehemiah Massed.   Past Medical History:  Diagnosis Date  . AAA (abdominal aortic aneurysm) [facility]) 06/03/2007   [facility]; Dr. Kellie Simmering  . AICD (automatic cardioverter/defibrillator) present   . Barrett's esophagus   . Bladder cancer ([place])   . Bradycardia   . CAD (coronary artery disease)   . CHF (congestive heart failure) ([place])   . Cluster headache   . DDD (degenerative disc disease), lumbar   . Dyspnea    WITH EXERTION  . Edema    LEFT ANKLE  . Fracture of skull base ([place]) 1997   due to fall  . GERD (gastroesophageal reflux disease)   . Gout    . History of bladder cancer 12/1995  . Hyperlipidemia   . Hypertension   . Malignant melanoma ([place]) 12/2012   right dorsal forearm excised  . Myocardial infarction ([place])    LAST 2014  . Osteoarthritis of knee   . Pacemaker 10/10/2006  . Pneumonia    2016  . Pre-diabetes   . Psoriasis   . Rib fracture 1997   due to fall  . Sleep apnea    CPAP  . Stroke ([place])   . Venous incompetence     Patient Active Problem List   Diagnosis Date Noted  . Numbness 05/07/2018  . Benign colonic polyp 03/24/2017  . Tibialis anterior tenosynovitis 12/21/2015  . Pleural nodule 07/27/2015  . Psoriatic arthritis ([place]) 05/12/2015  . Restrictive lung disease 05/12/2015  . Barrett esophagus 03/30/2015  . Ache in joint 03/30/2015  . Bradycardia 03/30/2015  . Arteriosclerosis of coronary artery 03/30/2015  . Cardiac defibrillator in place 03/30/2015  . Chronic kidney disease (CKD), stage III (moderate) ([place]) 03/30/2015  . Cluster headache syndrome 03/30/2015  . Degeneration of lumbar or lumbosacral intervertebral disc 03/30/2015  . Gout 03/30/2015  . Personal history of malignant neoplasm of bladder 03/30/2015  . Hypercholesteremia 03/30/2015  . LBP (low back pain) 03/30/2015  . Personal history of malignant melanoma of skin 03/30/2015  . Muscle ache 03/30/2015  . Arthritis of knee, degenerative 03/30/2015  . Prediabetes  03/30/2015  . Psoriasis 03/30/2015  . Obstructive sleep apnea 03/30/2015  . Chronic venous insufficiency 03/30/2015  . History of abdominal aortic aneurysm (AAA) 06/24/2013  . History of CVA (cerebrovascular accident) 06/24/2013  . Cardiomyopathy, ischemic 06/24/2013  . Essential (primary) hypertension 08/17/2011  . Arrhythmia, sinus node 08/17/2011  . Ventricular fibrillation ([place]) 08/17/2011  . Congestive heart failure ([place]) 02/26/2010  . 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 Cardiac pacemaker in situ 10/10/2006    Past Surgical History:   Procedure Laterality Date  . ABDOMINAL AORTIC ANEURYSM REPAIR  06/03/2007   [facility]; Dr. Kellie Simmering  . ANGIOPLASTY  1994   MI  . BLADDER TUMOR EXCISION  12/1995  . CATARACT EXTRACTION W/PHACO Left 10/22/2016   Procedure: CATARACT EXTRACTION PHACO AND INTRAOCULAR LENS PLACEMENT (IOC);  Surgeon: Birder Robson, MD;  Location: [facility] ORS;  Service: Ophthalmology;  Laterality: Left;  [place] 47.7 AP% 18.4 CDE 8.78 Fluid pack lot # [id number]  . CATARACT EXTRACTION W/PHACO Right 12/10/2016   Procedure: CATARACT EXTRACTION PHACO AND INTRAOCULAR LENS PLACEMENT (IOC);  Surgeon: Birder Robson, MD;  Location: [facility] ORS;  Service: Ophthalmology;  Laterality: Right;  [place] 00:39 AP% 23.3 CDE 9.13 Fluid pack lot # [id number]  . CORONARY ANGIOPLASTY     STENTS X 5  . CORONARY ARTERY BYPASS GRAFT  09/22/2006   four  . ELBOW BURSA SURGERY     DUE TO GOUT  . INSERT / REPLACE / REMOVE PACEMAKER    . MELANOMA EXCISION  12/2012   Right forearm    Current Outpatient Rx  . Order #: [id number] Class: Normal  . Order #: [id number] Class: Historical Med  . Order #: [id number] Class: Normal  . Order #: [id number] Class: Normal  . Order #: [id number] Class: Historical Med  . Order #: [id number] Class: Normal  . Order #: [id number] Class: Historical Med  . Order #: [id number] Class: Normal  . Order #: [id number] Class: Normal  . Order #: [id number] Class: Normal  . Order #: [id number] Class: Normal    Allergies Amlodipine besylate; Crestor [rosuvastatin]; and Rocephin [ceftriaxone]  Family History  Problem Relation Age of Onset  . Cancer Mother        Melanoma skin cancer  . Heart attack Father 77  . Cancer Father        throat cancer  . Arthritis Brother     Social History Social History   Tobacco Use  . Smoking status: Former Smoker    Packs/day: 1.00    Types: Cigarettes    Last attempt to quit: 11/26/1999    Years since quitting: 18.5  . Smokeless tobacco: Never Used  Substance Use Topics   . Alcohol use: No  . Drug use: No    Review of Systems Constitutional: No fever/chills.  One episode of lightheadedness without syncope, now resolved. Eyes: No visual changes. ENT: No sore throat. No congestion or rhinorrhea. Cardiovascular: Denies chest pain. Denies palpitations. Respiratory: Positive shortness of breath, which is better at rest and worse with exertion.  No cough. Gastrointestinal: No abdominal pain.  No nausea, no vomiting.  No diarrhea.  No constipation. Genitourinary: Negative for dysuria. Musculoskeletal: Negative for back pain.  Left greater than right lower extremity edema.  No calf pain. Skin: Negative for rash. Neurological: Negative for headaches. No focal numbness, tingling or weakness.     ____________________________________________   PHYSICAL EXAM:  VITAL SIGNS: ED Triage Vitals  Enc Vitals Group     BP 06/01/18 1642 (!) 143/77     Pulse Rate  06/01/18 1642 (!) 53     Resp 06/01/18 1638 16     Temp 06/01/18 1638 97.9 F (36.6 C)     Temp Source 06/01/18 1638 Oral     SpO2 06/01/18 1642 97 %     Weight 06/01/18 1638 228 lb (103.4 kg)     Height 06/01/18 1638 5\' 10"  (1.778 m)     Head Circumference --      Peak Flow --      Pain Score 06/01/18 1638 0     Pain Loc --      Pain Edu? --      Excl. in [place]? --     Constitutional: Alert and oriented.  Answers questions appropriately.  Likely ill-appearing, nontoxic. Eyes: Conjunctivae are normal.  EOMI. No scleral icterus. Head: Atraumatic. Nose: No congestion/rhinnorhea. Mouth/Throat: Mucous membranes are moist.  Neck: No stridor.  Supple.  No JVD.  No meningismus. Cardiovascular: Normal rate, regular rhythm. No murmurs, rubs or gallops.  Respiratory: Normal respiratory effort.  No accessory muscle use or retractions. Lungs CTAB.  No wheezes, rales or ronchi. Gastrointestinal: Obese soft, nontender and nondistended.  No guarding or rebound.  No peritoneal signs. Musculoskeletal: Positive  left lower extremity edema to the mid tibial shaft.  The right lower extremity has no evidence of edema.. No ttp in the calves or palpable cords.  Negative Homan's sign. Neurologic:  A&Ox3.  Speech is clear.  Face and smile are symmetric.  EOMI.  Moves all extremities well. Skin:  Skin is warm, dry and intact. No rash noted. Psychiatric: Mood and affect are normal. Speech and behavior are normal.  Normal judgement  ____________________________________________   LABS (all labs ordered are listed, but only abnormal results are displayed)  Labs Reviewed  BASIC METABOLIC PANEL - Abnormal; Notable for the following components:      Result Value   Glucose, Bld 162 (*)    Creatinine, Ser 1.48 (*)    Calcium 8.4 (*)    GFR calc non Af Amer 45 (*)    GFR calc Af Amer 52 (*)    All other components within normal limits  CBC - Abnormal; Notable for the following components:   RBC 4.28 (*)    RDW 15.2 (*)    Platelets 134 (*)    All other components within normal limits  TROPONIN I - Abnormal; Notable for the following components:   Troponin I 0.03 (*)    All other components within normal limits  BRAIN NATRIURETIC PEPTIDE - Abnormal; Notable for the following components:   B Natriuretic Peptide 340.0 (*)    All other components within normal limits  TROPONIN I - Abnormal; Notable for the following components:   Troponin I 0.03 (*)    All other components within normal limits   ____________________________________________  EKG  ED ECG REPORT I, Eula Listen, the attending physician, personally viewed and interpreted this ECG.   Date: 06/01/2018  EKG Time: 1636  Rate: 63  Rhythm: AV paced; + PVC  Axis: leftward  Intervals:first-degree A-V block ; interventricular delay  ST&T Change: No STEMI  ____________________________________________  RADIOLOGY  Dg Chest 2 View  Result Date: 06/01/2018 CLINICAL DATA:  Short of breath for 1 week EXAM: CHEST - 2 VIEW COMPARISON:   07/07/2015 FINDINGS: LEFT-sided pacemaker overlies stable enlarged cardiac silhouette. There is central venous pulmonary congestion increased from prior. No focal consolidation. No pleural fluid. Lungs are mildly hyperinflated. No pneumothorax. No acute osseous abnormality. IMPRESSION: Increased central venous  congestion.  No focal infiltrate. Electronically Signed   By: Suzy Bouchard M.D.   On: 06/01/2018 17:24   US Venous Img Lower Unilateral Left  Result Date: 06/01/2018 CLINICAL DATA:  77 y/o  M; left leg swelling. EXAM: LEFT LOWER EXTREMITY VENOUS DOPPLER ULTRASOUND TECHNIQUE: Gray-scale sonography with graded compression, as well as color Doppler and duplex ultrasound were performed to evaluate the lower extremity deep venous systems from the level of the common femoral vein and including the common femoral, femoral, profunda femoral, popliteal and calf veins including the posterior tibial, peroneal and gastrocnemius veins when visible. The superficial great saphenous vein was also interrogated. Spectral Doppler was utilized to evaluate flow at rest and with distal augmentation maneuvers in the common femoral, femoral and popliteal veins. COMPARISON:  Left lower extremity venous Doppler dated 02/07/2010. FINDINGS: Contralateral Common Femoral Vein: Respiratory phasicity is normal and symmetric with the symptomatic side. No evidence of thrombus. Normal compressibility. Common Femoral Vein: No evidence of thrombus. Normal compressibility, respiratory phasicity and response to augmentation. Saphenofemoral Junction: No evidence of thrombus. Normal compressibility and flow on color Doppler imaging. Profunda Femoral Vein: No evidence of thrombus. Normal compressibility and flow on color Doppler imaging. Femoral Vein: No evidence of thrombus. Normal compressibility, respiratory phasicity and response to augmentation. Popliteal Vein: No evidence of thrombus. Normal compressibility, respiratory phasicity and  response to augmentation. Calf Veins: No evidence of thrombus. Normal compressibility and flow on color Doppler imaging. Superficial Great Saphenous Vein: No evidence of thrombus. Normal compressibility. Venous Reflux:  None. Other Findings:  None. IMPRESSION: No evidence of deep venous thrombosis. Electronically Signed   By: Kristine Garbe M.D.   On: 06/01/2018 21:48    ____________________________________________   PROCEDURES  Procedure(s) performed: None  Procedures  Critical Care performed: No ____________________________________________   INITIAL IMPRESSION / ASSESSMENT AND PLAN / ED COURSE  Pertinent labs & imaging results that were available during my care of the patient were reviewed by me and considered in my medical decision making (see chart for details).  77 y.o. male with a history of CHF presenting for shortness of breath.  Overall, the patient is hemodynamically stable in the emergency department.  His clinical picture does suggest some fluid overload with weight gain of 18 pounds, lower extremity edema, and central vascular congestion on his chest x-ray although I do not hear anything abnormal in his pulmonary examination.  The patient has a troponin of 0.03 which will be repeated; he is AV paced but I do not see any evidence of ischemia on his EKG.  His BNP today is 340s, compared to his last BNP in 2016 that was in the 100s.  I have reviewed his medical chart, and it does show that his last echo which was performed 05/07/2018 has a EF of 20 to 25%.  The patient does have some renal insufficiency so I have had a long discussion with the patient and his wife about diuresing the patient and potentially worsening this renal insufficiency.  However, at this time I do think he would benefit from some diuresis.  If he is able to maintain normal ambulatory oxygen saturation, is able to have successful diuresis and his second troponin is unchanged, I will plan to discharge him  home with follow-up with Dr. Nehemiah Massed tomorrow for repeat evaluation including repeat creatinine.   ----------------------------------------- 10:58 PM on 06/01/2018 -----------------------------------------  The patient's work-up in the emergency department has been reassuring.  He continues to be hemodynamically stable.  His repeat troponin  is unchanged and he has not had any chest pain.  He has had good diuresis from his Lasix.  The ultrasounds performed of his left lower extremity does not show any evidence of DVT.  At this time, the patient will be discharged home.  He is instructed to continue his Lasix and to follow-up with his cardiologist tomorrow.  He understands that he will need to have his creatinine really rechecked.  Follow-up instructions as well as return precautions were discussed with the patient and his wife.  I did discuss the patient's ED visit with the on call cardiologist.  ____________________________________________  FINAL CLINICAL IMPRESSION(S) / ED DIAGNOSES  Final diagnoses:  SOB (shortness of breath)  Renal insufficiency  Acute on chronic congestive heart failure, unspecified heart failure type ([place])         NEW MEDICATIONS STARTED DURING THIS VISIT:  New Prescriptions   No medications on file      Eula Listen, MD 06/01/18 2259

## 2018-06-01 NOTE — ED Notes (Signed)
Date and time results received: 06/01/18 5:35 PM  (use smartphrase ".now" to insert current time)  Test: troponin Critical Value: 0.03  Name of Provider Notified: Dr. Mariea Clonts  Orders Received? Or Actions Taken?: Discussed troponin, labs and xray with Dr. Mariea Clonts.  Dr. Mariea Clonts acknowledged.

## 2018-06-01 NOTE — ED Notes (Signed)
BNP ordered

## 2018-06-01 NOTE — ED Notes (Signed)
Pt returned from US

## 2018-06-01 NOTE — ED Notes (Signed)
Patient transported to US 

## 2018-06-01 NOTE — ED Notes (Signed)
First Nurse Note:  Patient sent to the ED by PCP for possible CHF exacerbation.  Patient has gained 15lb in the past 2 weeks and is more short of breath than normal.  Patient is in no obvious distress at this time.  Ambulatory to registration desk without difficulty.

## 2018-06-03 DIAGNOSIS — I5023 Acute on chronic systolic (congestive) heart failure: Secondary | ICD-10-CM | POA: Diagnosis not present

## 2018-06-03 DIAGNOSIS — G4733 Obstructive sleep apnea (adult) (pediatric): Secondary | ICD-10-CM | POA: Diagnosis not present

## 2018-06-03 DIAGNOSIS — I25708 Atherosclerosis of coronary artery bypass graft(s), unspecified, with other forms of angina pectoris: Secondary | ICD-10-CM | POA: Diagnosis not present

## 2018-06-03 DIAGNOSIS — I10 Essential (primary) hypertension: Secondary | ICD-10-CM | POA: Diagnosis not present

## 2018-06-16 DIAGNOSIS — I5022 Chronic systolic (congestive) heart failure: Secondary | ICD-10-CM | POA: Diagnosis not present

## 2018-06-16 DIAGNOSIS — N183 Chronic kidney disease, stage 3 (moderate): Secondary | ICD-10-CM | POA: Diagnosis not present

## 2018-06-16 DIAGNOSIS — I25810 Atherosclerosis of coronary artery bypass graft(s) without angina pectoris: Secondary | ICD-10-CM | POA: Diagnosis not present

## 2018-06-16 DIAGNOSIS — I714 Abdominal aortic aneurysm, without rupture: Secondary | ICD-10-CM | POA: Diagnosis not present

## 2018-07-21 ENCOUNTER — Encounter: Payer: Self-pay | Admitting: Family Medicine

## 2018-07-21 ENCOUNTER — Ambulatory Visit (INDEPENDENT_AMBULATORY_CARE_PROVIDER_SITE_OTHER): Payer: Medicare Other | Admitting: Family Medicine

## 2018-07-21 VITALS — BP 117/67 | HR 55 | Temp 97.9°F | Resp 18 | Wt 220.0 lb

## 2018-07-21 DIAGNOSIS — H6692 Otitis media, unspecified, left ear: Secondary | ICD-10-CM

## 2018-07-21 DIAGNOSIS — J029 Acute pharyngitis, unspecified: Secondary | ICD-10-CM | POA: Diagnosis not present

## 2018-07-21 LAB — POCT RAPID STREP A (OFFICE): Rapid Strep A Screen: NEGATIVE

## 2018-07-21 MED ORDER — AMOXICILLIN 500 MG PO CAPS: 500.0000 mg | ORAL_CAPSULE | Freq: Three times a day (TID) | ORAL | 0 refills | Status: AC

## 2018-07-21 NOTE — Progress Notes (Signed)
Patient: Taylor [name] Male    DOB: 04/01/1941   77 y.o.   MRN: [id number] Visit Date: 07/21/2018  Today's Provider: Lelon Huh, MD   Chief Complaint  Patient presents with  . Sore Throat    x 1 day   Subjective:    Sore Throat   This is a new problem. The current episode started yesterday. The problem has been gradually worsening. The pain is worse on the left side. There has been no fever. The pain is moderate. Associated symptoms include ear pain (left ear), a hoarse voice and trouble swallowing. Pertinent negatives include no abdominal pain, congestion, coughing, shortness of breath or vomiting. He has had no exposure to strep or mono. He has tried acetaminophen for the symptoms. The treatment provided mild relief.       Allergies  Allergen Reactions  . Amlodipine Besylate Swelling    Had a reaction when taking with colcrys   . Crestor [Rosuvastatin]     Muscle cramps and pain  . Rocephin [Ceftriaxone]     unknown     Current Outpatient Medications:  .  allopurinol (ZYLOPRIM) 300 MG tablet, TAKE 1 TABLET BY MOUTH EVERY DAY, Disp: 90 tablet, Rfl: 2 .  aspirin 81 MG EC tablet, Take 81 mg by mouth daily. , Disp: , Rfl:  .  atorvastatin (LIPITOR) 40 MG tablet, TAKE 1 TABLET BY MOUTH EVERY DAY, Disp: 90 tablet, Rfl: 2 .  carvedilol (COREG) 12.5 MG tablet, TAKE 1 TABLET BY MOUTH DAILY, Disp: 60 tablet, Rfl: 5 .  Cyanocobalamin (VITAMIN B-12 CR) 1000 MCG TBCR, Take 1,000 mcg by mouth daily. , Disp: , Rfl:  .  furosemide (LASIX) 20 MG tablet, Take 2 tablets (40 mg total) by mouth daily., Disp: 180 tablet, Rfl: 4 .  GLUCOSAMINE-VITAMIN D PO, Take 1 tablet by mouth daily., Disp: , Rfl:  .  indomethacin (INDOCIN) 50 MG capsule, TAKE 1 CAPSULE BY MOUTH THREE TIMES DAILY AS NEEDED, Disp: 270 capsule, Rfl: 1 .  losartan (COZAAR) 50 MG tablet, TAKE 1 TABLET BY MOUTH DAILY, Disp: 90 tablet, Rfl: 4 .  pantoprazole (PROTONIX) 40 MG tablet, TAKE 1 TABLET BY MOUTH EVERY DAY, Disp:  90 tablet, Rfl: 4 .  triamcinolone ointment (KENALOG) 0.1 %, Apply 1 application topically 2 (two) times daily as needed (itching)., Disp: 30 g, Rfl: 3  Review of Systems  Constitutional: Negative for appetite change, chills, fatigue and fever.  HENT: Positive for ear pain (left ear), hoarse voice, sore throat and trouble swallowing. Negative for congestion.   Respiratory: Negative for cough, chest tightness, shortness of breath and wheezing.   Cardiovascular: Negative for chest pain and palpitations.  Gastrointestinal: Negative for abdominal pain, nausea and vomiting.    Social History   Tobacco Use  . Smoking status: Former Smoker    Packs/day: 1.00    Types: Cigarettes    Last attempt to quit: 11/26/1999    Years since quitting: 18.6  . Smokeless tobacco: Never Used  Substance Use Topics  . Alcohol use: No   Objective:   BP 117/67 (BP Location: Left Arm, Patient Position: Sitting, Cuff Size: Large)   Pulse (!) 55   Temp 97.9 F (36.6 C) (Oral)   Resp 18   Wt 220 lb (99.8 kg)   SpO2 97% Comment: room air  BMI 31.57 kg/m     Physical Exam  General Appearance:    Alert, cooperative, no distress  HENT:   Mild erythema  left tonsillar pillar. Left TM mildly inflamed. Mild left anterior LAD  Eyes:    PERRL, conjunctiva/corneas clear, EOM's intact       Lungs:     Clear to auscultation bilaterally, respirations unlabored  Heart:    Regular rate and rhythm  Neurologic:   Awake, alert, oriented x 3. No apparent focal neurological           defect.       Results for orders placed or performed in visit on 07/21/18  POCT rapid strep A  Result Value Ref Range   Rapid Strep A Screen Negative Negative       Assessment & Plan:     1. Sore throat  - POCT rapid strep A  2. Left otitis media, unspecified otitis media type  - amoxicillin (AMOXIL) 500 MG capsule; Take 1 capsule (500 mg total) by mouth 3 (three) times daily for 7 days.  Dispense: 21 capsule; Refill: 0  Call  if symptoms change or if not rapidly improving.          Lelon Huh, MD  [facility]

## 2018-08-10 ENCOUNTER — Ambulatory Visit
Admission: RE | Admit: 2018-08-10 | Discharge: 2018-08-10 | Disposition: A | Discharge status: Home / Self Care | Payer: Medicare Other | Source: Ambulatory Visit | Attending: Family Medicine | Admitting: Family Medicine

## 2018-08-10 ENCOUNTER — Ambulatory Visit (INDEPENDENT_AMBULATORY_CARE_PROVIDER_SITE_OTHER): Payer: Medicare Other | Admitting: Family Medicine

## 2018-08-10 ENCOUNTER — Encounter: Payer: Self-pay | Admitting: Family Medicine

## 2018-08-10 ENCOUNTER — Telehealth: Payer: Self-pay

## 2018-08-10 VITALS — BP 160/72 | HR 58 | Temp 97.7°F | Resp 16 | Wt 224.0 lb

## 2018-08-10 DIAGNOSIS — R0602 Shortness of breath: Secondary | ICD-10-CM | POA: Diagnosis not present

## 2018-08-10 DIAGNOSIS — R079 Chest pain, unspecified: Secondary | ICD-10-CM | POA: Diagnosis not present

## 2018-08-10 DIAGNOSIS — J449 Chronic obstructive pulmonary disease, unspecified: Secondary | ICD-10-CM | POA: Insufficient documentation

## 2018-08-10 DIAGNOSIS — I509 Heart failure, unspecified: Secondary | ICD-10-CM | POA: Insufficient documentation

## 2018-08-10 DIAGNOSIS — I5020 Unspecified systolic (congestive) heart failure: Secondary | ICD-10-CM

## 2018-08-10 NOTE — Progress Notes (Signed)
Patient: Taylor [name] Male    DOB: 06-01-1941   77 y.o.   MRN: [id number] Visit Date: 08/10/2018  Today's Provider: Lelon Huh, MD   Chief Complaint  Patient presents with  . Shortness of Breath   Subjective:    Shortness of Breath  This is a new problem. Episode onset: 3 days ago. The problem has been unchanged. Pertinent negatives include no abdominal pain, chest pain, fever, vomiting or wheezing.   He states he was in his car driving after riding bicycle over the weekend and had very brief stinging in chest x 2 that lasted 1-2 seconds. Has had no pain since then.   Having some nasal drainage. No coughing, wheezing. No fever. Has gains 4 pounds since 8-27.   Wt Readings from Last 5 Encounters:  08/10/18 224 lb (101.6 kg)  07/21/18 220 lb (99.8 kg)  06/01/18 228 lb (103.4 kg)  06/01/18 228 lb (103.4 kg)  05/07/18 217 lb 8 oz (98.7 kg)          Allergies  Allergen Reactions  . Amlodipine Besylate Swelling    Had a reaction when taking with colcrys   . Crestor [Rosuvastatin]     Muscle cramps and pain  . Rocephin [Ceftriaxone]     unknown     Current Outpatient Medications:  .  allopurinol (ZYLOPRIM) 300 MG tablet, TAKE 1 TABLET BY MOUTH EVERY DAY, Disp: 90 tablet, Rfl: 2 .  aspirin 81 MG EC tablet, Take 81 mg by mouth daily. , Disp: , Rfl:  .  atorvastatin (LIPITOR) 40 MG tablet, TAKE 1 TABLET BY MOUTH EVERY DAY, Disp: 90 tablet, Rfl: 2 .  carvedilol (COREG) 12.5 MG tablet, TAKE 1 TABLET BY MOUTH DAILY, Disp: 60 tablet, Rfl: 5 .  Cyanocobalamin (VITAMIN B-12 CR) 1000 MCG TBCR, Take 1,000 mcg by mouth daily. , Disp: , Rfl:  .  furosemide (LASIX) 20 MG tablet, Take 2 tablets (40 mg total) by mouth daily., Disp: 180 tablet, Rfl: 4 .  indomethacin (INDOCIN) 50 MG capsule, TAKE 1 CAPSULE BY MOUTH THREE TIMES DAILY AS NEEDED, Disp: 270 capsule, Rfl: 1 .  losartan (COZAAR) 50 MG tablet, TAKE 1 TABLET BY MOUTH DAILY, Disp: 90 tablet, Rfl: 4 .  pantoprazole  (PROTONIX) 40 MG tablet, TAKE 1 TABLET BY MOUTH EVERY DAY, Disp: 90 tablet, Rfl: 4 .  triamcinolone ointment (KENALOG) 0.1 %, Apply 1 application topically 2 (two) times daily as needed (itching)., Disp: 30 g, Rfl: 3 .  GLUCOSAMINE-VITAMIN D PO, Take 1 tablet by mouth daily., Disp: , Rfl:   Review of Systems  Constitutional: Negative for appetite change, chills and fever.  Respiratory: Positive for shortness of breath. Negative for chest tightness and wheezing.   Cardiovascular: Negative for chest pain and palpitations.  Gastrointestinal: Negative for abdominal pain, nausea and vomiting.    Social History   Tobacco Use  . Smoking status: Former Smoker    Packs/day: 1.00    Types: Cigarettes    Last attempt to quit: 11/26/1999    Years since quitting: 18.7  . Smokeless tobacco: Never Used  Substance Use Topics  . Alcohol use: No   Objective:   BP (!) 160/72 (BP Location: Left Arm, Cuff Size: Large)   Pulse (!) 58   Temp 97.7 F (36.5 C) (Oral)   Resp 16   Wt 224 lb (101.6 kg)   SpO2 97% Comment: room air  BMI 32.14 kg/m  Vitals:   08/10/18 1123  08/10/18 1125  BP: (!) 173/81 (!) 160/72  Pulse: (!) 58   Resp: 16   Temp: 97.7 F (36.5 C)   TempSrc: Oral   SpO2: 97%   Weight: 224 lb (101.6 kg)      Physical Exam   General Appearance:    Alert, cooperative, no distress  Eyes:    PERRL, conjunctiva/corneas clear, EOM's intact       Lungs:     Clear to auscultation bilaterally, respirations unlabored  Heart:    Regular rate and rhythm  Ext:   2+ edema bilaterally       EKG atrial bradycardia. No acute changes.     Assessment & Plan:     1. Shortness of breath Likely mild CHF exacerbation. He is to take one extra furosemide today - EKG 12-Lead - Comprehensive metabolic panel - CBC - Brain natriuretic peptide - DG Chest 2 View; Future  2. Chest pain, unspecified type  - EKG 12-Lead  3. Systolic congestive heart failure, unspecified HF chronicity  (HCC) Discussed trial of Entresto. Will see how labs look and response to extra dose of furosemide. Consider changing losartan to [facility] after reviewing labs.        Lelon Huh, MD  [facility]

## 2018-08-10 NOTE — Telephone Encounter (Signed)
Patient called requesting an appointment. He states he has been feeling short of breath for the past 3 days. Patient states this occurs at rest and with exertion. Patient states this symptom is unchanged since Friday. He says he had 2 brief episodes of chest pain on Saturday while exercising (which he describes as a needle sticking him in the chest). He has not experienced anymore chest pain since. He has also had some runny nose. Patient denies any headache, blurred vision, numbness, tingling, light headedness or dizziness. Appointment has been scheduled for today at 11:20am.

## 2018-08-11 ENCOUNTER — Other Ambulatory Visit: Payer: Self-pay | Admitting: Family Medicine

## 2018-08-11 ENCOUNTER — Telehealth: Payer: Self-pay

## 2018-08-11 LAB — COMPREHENSIVE METABOLIC PANEL
A/G RATIO: 1.4 (ref 1.2–2.2)
ALT: 13 IU/L (ref 0–44)
AST: 16 IU/L (ref 0–40)
Albumin: 4.1 g/dL (ref 3.5–4.8)
Alkaline Phosphatase: 142 IU/L — ABNORMAL HIGH (ref 39–117)
BUN/Creatinine Ratio: 14 (ref 10–24)
BUN: 20 mg/dL (ref 8–27)
Bilirubin Total: 0.9 mg/dL (ref 0.0–1.2)
CALCIUM: 9.2 mg/dL (ref 8.6–10.2)
CO2: 27 mmol/L (ref 20–29)
Chloride: 102 mmol/L (ref 96–106)
Creatinine, Ser: 1.45 mg/dL — ABNORMAL HIGH (ref 0.76–1.27)
GFR, EST AFRICAN AMERICAN: 54 mL/min/{1.73_m2} — AB (ref >59)
GFR, EST NON AFRICAN AMERICAN: 47 mL/min/{1.73_m2} — AB (ref >59)
GLOBULIN, TOTAL: 2.9 g/dL (ref 1.5–4.5)
Glucose: 139 mg/dL — ABNORMAL HIGH (ref 65–99)
POTASSIUM: 3.9 mmol/L (ref 3.5–5.2)
SODIUM: 145 mmol/L — AB (ref 134–144)
TOTAL PROTEIN: 7 g/dL (ref 6.0–8.5)

## 2018-08-11 LAB — CBC
HEMOGLOBIN: 14.2 g/dL (ref 13.0–17.7)
Hematocrit: 40.9 % (ref 37.5–51.0)
MCH: 32.5 pg (ref 26.6–33.0)
MCHC: 34.7 g/dL (ref 31.5–35.7)
MCV: 94 fL (ref 79–97)
PLATELETS: 142 10*3/uL — AB (ref 150–450)
RBC: 4.37 x10E6/uL (ref 4.14–5.80)
RDW: 13.9 % (ref 12.3–15.4)
WBC: 6.7 10*3/uL (ref 3.4–10.8)

## 2018-08-11 LAB — BRAIN NATRIURETIC PEPTIDE: BNP: 656.4 pg/mL — ABNORMAL HIGH (ref 0.0–100.0)

## 2018-08-11 MED ORDER — SACUBITRIL-VALSARTAN 49-51 MG PO TABS: 1.0000 | ORAL_TABLET | Freq: Two times a day (BID) | ORAL | 0 refills | Status: DC

## 2018-08-11 NOTE — Telephone Encounter (Signed)
In that case, he should increase losartan to two tablets daily, and increase furosemide to 2 tablets daily. Follow up in two weeks.

## 2018-08-11 NOTE — Telephone Encounter (Signed)
Patient advised as below. Patient verbalizes understanding and is in agreement with treatment plan.  

## 2018-08-11 NOTE — Telephone Encounter (Signed)
-----   Message from Birdie Sons, MD sent at 08/11/2018  8:09 AM EDT ----- Chest xr suggests he is retaining a little fluid due to congestive heart failure. Recommend that he start Entresto twice a day. Will send prescription to his pharmacy.  He needs to stop losartan when he starts Entresto. It may take a few days for insurance to approve Entresto.  Only take one furosemide a day when he starts Entresto.  Schedule follow up in 3 weeks.

## 2018-08-11 NOTE — Progress Notes (Signed)
Change losartan to [facility]

## 2018-08-11 NOTE — Telephone Encounter (Signed)
Patient advised as below. Patient reports that he went to [facility] pharmacy and the medication is costing him $140 and patient reports that he can not afford entresto.

## 2018-08-22 ENCOUNTER — Other Ambulatory Visit: Payer: Self-pay | Admitting: Family Medicine

## 2018-08-25 ENCOUNTER — Other Ambulatory Visit: Payer: Self-pay | Admitting: Family Medicine

## 2018-08-26 ENCOUNTER — Ambulatory Visit (INDEPENDENT_AMBULATORY_CARE_PROVIDER_SITE_OTHER): Payer: Medicare Other | Admitting: Family Medicine

## 2018-08-26 ENCOUNTER — Encounter: Payer: Self-pay | Admitting: Family Medicine

## 2018-08-26 ENCOUNTER — Other Ambulatory Visit: Payer: Self-pay | Admitting: Family Medicine

## 2018-08-26 VITALS — BP 148/72 | HR 59 | Temp 97.6°F | Resp 16 | Wt 220.0 lb

## 2018-08-26 DIAGNOSIS — Z23 Encounter for immunization: Secondary | ICD-10-CM | POA: Diagnosis not present

## 2018-08-26 DIAGNOSIS — I509 Heart failure, unspecified: Secondary | ICD-10-CM

## 2018-08-26 MED ORDER — TORSEMIDE 20 MG PO TABS: 40.0000 mg | ORAL_TABLET | Freq: Every day | ORAL | 1 refills | Status: DC

## 2018-08-26 MED ORDER — VALSARTAN 160 MG PO TABS: 160.0000 mg | ORAL_TABLET | Freq: Every day | ORAL | 1 refills | Status: DC

## 2018-08-26 NOTE — Progress Notes (Signed)
Patient: Taylor [name] Male    DOB: 02-27-1941   77 y.o.   MRN: [id number] Visit Date: 08/26/2018  Today's Provider: Lelon Huh, MD   Chief Complaint  Patient presents with  . Follow-up   Subjective:    HPI  Shortness of breath From 08/10/2018-chest x-ray ordered showing a little fluid in the lungs. Patient was advised to increase losartan to 2 tablets qd and increase furosemide to 2 tablets a day. We then tried to have him change to [facility] for systolic CHF, but he did not fill that prescription due to cost of medications. Qd.. Today patient comes in reporting good compliance with treatment. He states the shortness of breath on exertion is a little better, but still gets short of breath when he sits and lays back in chair or when he tries to sleep on his back.    Allergies  Allergen Reactions  . Amlodipine Besylate Swelling    Had a reaction when taking with colcrys   . Crestor [Rosuvastatin]     Muscle cramps and pain  . Rocephin [Ceftriaxone]     unknown     Current Outpatient Medications:  .  allopurinol (ZYLOPRIM) 300 MG tablet, TAKE 1 TABLET BY MOUTH EVERY DAY, Disp: 90 tablet, Rfl: 4 .  aspirin 81 MG EC tablet, Take 81 mg by mouth daily. , Disp: , Rfl:  .  atorvastatin (LIPITOR) 40 MG tablet, TAKE 1 TABLET BY MOUTH EVERY DAY, Disp: 90 tablet, Rfl: 2 .  carvedilol (COREG) 12.5 MG tablet, TAKE 1 TABLET BY MOUTH DAILY, Disp: 90 tablet, Rfl: 5 .  Cyanocobalamin (VITAMIN B-12 CR) 1000 MCG TBCR, Take 1,000 mcg by mouth daily. , Disp: , Rfl:  .  furosemide (LASIX) 20 MG tablet, Take 2 tablets (40 mg total) by mouth daily., Disp: 180 tablet, Rfl: 4 .  GLUCOSAMINE-VITAMIN D PO, Take 1 tablet by mouth daily., Disp: , Rfl:  .  indomethacin (INDOCIN) 50 MG capsule, TAKE 1 CAPSULE BY MOUTH THREE TIMES DAILY AS NEEDED, Disp: 270 capsule, Rfl: 1 .  losartan (COZAAR) 50 MG tablet, TAKE 1 TABLET BY MOUTH DAILY (Patient taking differently: Take 100 mg by mouth daily. ), Disp:  90 tablet, Rfl: 4 .  pantoprazole (PROTONIX) 40 MG tablet, TAKE 1 TABLET BY MOUTH EVERY DAY, Disp: 90 tablet, Rfl: 4 .  triamcinolone ointment (KENALOG) 0.1 %, Apply 1 application topically 2 (two) times daily as needed (itching)., Disp: 30 g, Rfl: 3 .  sacubitril-valsartan (ENTRESTO) 49-51 MG, Take 1 tablet by mouth 2 (two) times daily. (Patient not taking: Reported on 08/26/2018), Disp: 60 tablet, Rfl: 0  Review of Systems  Constitutional: Negative for appetite change, chills and fever.  Respiratory: Positive for shortness of breath. Negative for chest tightness and wheezing.   Cardiovascular: Negative for chest pain and palpitations.  Gastrointestinal: Negative for abdominal pain, nausea and vomiting.    Social History   Tobacco Use  . Smoking status: Former Smoker    Packs/day: 1.00    Types: Cigarettes    Last attempt to quit: 11/26/1999    Years since quitting: 18.7  . Smokeless tobacco: Never Used  Substance Use Topics  . Alcohol use: No   Objective:   BP (!) 148/72 (BP Location: Left Arm, Patient Position: Sitting, Cuff Size: Large)   Pulse (!) 59   Temp 97.6 F (36.4 C) (Oral)   Resp 16   Wt 220 lb (99.8 kg)   SpO2 96% Comment: room  air  BMI 31.57 kg/m  Vitals:   08/26/18 0832  BP: (!) 148/72  Pulse: (!) 59  Resp: 16  Temp: 97.6 F (36.4 C)  TempSrc: Oral  SpO2: 96%  Weight: 220 lb (99.8 kg)     Physical Exam   General Appearance:    Alert, cooperative, no distress  Eyes:    PERRL, conjunctiva/corneas clear, EOM's intact       Lungs:     Clear to auscultation bilaterally, respirations unlabored  Heart:    Regular rate and rhythm  Neurologic:   Awake, alert, oriented x 3. No apparent focal neurological           defect.           Assessment & Plan:     1. Acute on chronic congestive heart failure, unspecified heart failure type [facility]) Patient is good candidate for Entresto, but medication is too expensive for him. Will change losartan to valsartan 160  and furosemide to torsemide. Consider adding spironolactone or patient assistance for Entresto if not better at follow up.   - torsemide (DEMADEX) 20 MG tablet; Take 2 tablets (40 mg total) by mouth daily.  Dispense: 60 tablet; Refill: 1 - valsartan (DIOVAN) 160 MG tablet; Take 1 tablet (160 mg total) by mouth daily.  Dispense: 30 tablet; Refill: 1  2. Need for influenza vaccination  - Flu vaccine HIGH DOSE PF (Fluzone High dose)  Return in about 3 weeks (around 09/16/2018).       Lelon Huh, MD  [facility]

## 2018-08-26 NOTE — Patient Instructions (Signed)
   Start taking valsartan 160mg  ONE tablet daily in the place of losartan   Start taking torsemide 40mg  TWO tablets daily in the place of furosemide.   Please call my office if you have any problems filling prescriptions or taking medications.

## 2018-09-21 ENCOUNTER — Encounter: Payer: Self-pay | Admitting: Family Medicine

## 2018-09-21 ENCOUNTER — Ambulatory Visit (INDEPENDENT_AMBULATORY_CARE_PROVIDER_SITE_OTHER): Payer: Medicare Other | Admitting: Family Medicine

## 2018-09-21 VITALS — BP 110/60 | HR 52 | Temp 97.8°F | Resp 16 | Wt 216.0 lb

## 2018-09-21 DIAGNOSIS — I509 Heart failure, unspecified: Secondary | ICD-10-CM

## 2018-09-21 DIAGNOSIS — I10 Essential (primary) hypertension: Secondary | ICD-10-CM

## 2018-09-21 NOTE — Progress Notes (Signed)
Patient: Taylor [name] Male    DOB: [birth year]   77 y.o.   MRN: [id number] Visit Date: 09/21/2018  Today's Provider: Lelon Huh, MD   Chief Complaint  Patient presents with  . Follow-up   Subjective:    HPI CHF: Patient was last seen for this problem 3 weeks ago. Changes made during that visit includes changing Losartan to Valsartan 160mg  and changed Furosemide to Torsemide for CHF. He states swelling and dyspnea have significantly improved. Has been more active, can walk up 2 flights of stairs without getting short of breath. We previously attempted to prescription Entresto but he did not fill prescription due to expense.   Wt Readings from Last 3 Encounters:  09/21/18 216 lb (98 kg)  08/26/18 220 lb (99.8 kg)  08/10/18 224 lb (101.6 kg)       Allergies  Allergen Reactions  . Amlodipine Besylate Swelling    Had a reaction when taking with colcrys   . Crestor [Rosuvastatin]     Muscle cramps and pain  . Rocephin [Ceftriaxone]     unknown     Current Outpatient Medications:  .  allopurinol (ZYLOPRIM) 300 MG tablet, TAKE 1 TABLET BY MOUTH EVERY DAY, Disp: 90 tablet, Rfl: 4 .  aspirin 81 MG EC tablet, Take 81 mg by mouth daily. , Disp: , Rfl:  .  atorvastatin (LIPITOR) 40 MG tablet, TAKE 1 TABLET BY MOUTH EVERY DAY, Disp: 90 tablet, Rfl: 2 .  carvedilol (COREG) 12.5 MG tablet, TAKE 1 TABLET BY MOUTH DAILY, Disp: 90 tablet, Rfl: 5 .  Cyanocobalamin (VITAMIN B-12 CR) 1000 MCG TBCR, Take 1,000 mcg by mouth daily. , Disp: , Rfl:  .  indomethacin (INDOCIN) 50 MG capsule, TAKE 1 CAPSULE BY MOUTH THREE TIMES DAILY AS NEEDED, Disp: 270 capsule, Rfl: 1 .  pantoprazole (PROTONIX) 40 MG tablet, TAKE 1 TABLET BY MOUTH EVERY DAY, Disp: 90 tablet, Rfl: 4 .  torsemide (DEMADEX) 20 MG tablet, TAKE 2 TABLETS BY MOUTH DAILY( TAKE IN PLACE OF FUROSEMIDE), Disp: 180 tablet, Rfl: 0 .  triamcinolone ointment (KENALOG) 0.1 %, Apply 1 application topically 2 (two) times daily as needed  (itching)., Disp: 30 g, Rfl: 3 .  valsartan (DIOVAN) 160 MG tablet, TAKE 1 TABLET BY MOUTH DAILY( TAKE IN PLACE OF LOSARTAN), Disp: 90 tablet, Rfl: 0 .  furosemide (LASIX) 20 MG tablet, Take 2 tablets (40 mg total) by mouth daily. (Patient not taking: Reported on 09/21/2018), Disp: 180 tablet, Rfl: 4  Review of Systems  Constitutional: Negative for appetite change, chills and fever.  Respiratory: Negative for chest tightness, shortness of breath and wheezing.   Cardiovascular: Negative for chest pain and palpitations.  Gastrointestinal: Negative for abdominal pain, nausea and vomiting.  Neurological: Negative for weakness.    Social History   Tobacco Use  . Smoking status: Former Smoker    Packs/day: 1.00    Types: Cigarettes    Last attempt to quit: 11/26/1999    Years since quitting: 18.8  . Smokeless tobacco: Never Used  Substance Use Topics  . Alcohol use: No   Objective:   BP 110/60 (BP Location: Left Arm, Patient Position: Sitting, Cuff Size: Large)   Pulse (!) 52   Temp 97.8 F (36.6 C) (Oral)   Resp 16   Wt 216 lb (98 kg)   SpO2 96% Comment: room air  BMI 30.99 kg/m  Vitals:   09/21/18 0822  BP: 110/60  Pulse: (!) 52  Resp:  16  Temp: 97.8 F (36.6 C)  TempSrc: Oral  SpO2: 96%  Weight: 216 lb (98 kg)     Physical Exam   General Appearance:    Alert, cooperative, no distress  Eyes:    PERRL, conjunctiva/corneas clear, EOM's intact       Lungs:     Clear to auscultation bilaterally, respirations unlabored  Heart:    Regular rate and rhythm, 2+ bilateral LE edema.   Neurologic:   Awake, alert, oriented x 3. No apparent focal neurological           defect.           Assessment & Plan:     1. Acute on chronic congestive heart failure, unspecified heart failure type ([place]) Significantly improved with change from furosemide to torsemide and change from losartan to valsartan. Continue current medications.   - Renal function panel  2. Essential (primary)  hypertension Much better with above medications chanes.  Continue current medications.         Lelon Huh, MD  [facility]

## 2018-09-21 NOTE — Patient Instructions (Signed)
.   Please weigh yourself daily. Notify your doctor If you gain >2 pounds in 1 day or >5 pounds in 1 week

## 2018-09-22 ENCOUNTER — Encounter: Payer: Self-pay | Admitting: Family Medicine

## 2018-09-22 ENCOUNTER — Telehealth: Payer: Self-pay

## 2018-09-22 LAB — RENAL FUNCTION PANEL
Albumin: 4.2 g/dL (ref 3.5–4.8)
BUN / CREAT RATIO: 18 (ref 10–24)
BUN: 28 mg/dL — AB (ref 8–27)
CALCIUM: 9.7 mg/dL (ref 8.6–10.2)
CHLORIDE: 100 mmol/L (ref 96–106)
CO2: 27 mmol/L (ref 20–29)
Creatinine, Ser: 1.6 mg/dL — ABNORMAL HIGH (ref 0.76–1.27)
GFR calc non Af Amer: 42 mL/min/{1.73_m2} — ABNORMAL LOW (ref >59)
GFR, EST AFRICAN AMERICAN: 48 mL/min/{1.73_m2} — AB (ref >59)
Glucose: 116 mg/dL — ABNORMAL HIGH (ref 65–99)
Phosphorus: 2.9 mg/dL (ref 2.5–4.5)
Potassium: 4 mmol/L (ref 3.5–5.2)
SODIUM: 144 mmol/L (ref 134–144)

## 2018-09-22 NOTE — Telephone Encounter (Signed)
-----   Message from Birdie Sons, MD sent at 09/22/2018  7:45 AM EDT ----- Slight decline in kidney functions. Drink 1-2 more glasses or water a day. Continue current medications. Follow up 4 months

## 2018-09-22 NOTE — Telephone Encounter (Signed)
Patient was advised and appointment scheduled.,PC

## 2018-09-23 ENCOUNTER — Other Ambulatory Visit: Payer: Self-pay | Admitting: Family Medicine

## 2018-09-26 ENCOUNTER — Other Ambulatory Visit: Payer: Self-pay | Admitting: Family Medicine

## 2018-11-06 DIAGNOSIS — G4730 Sleep apnea, unspecified: Secondary | ICD-10-CM | POA: Diagnosis not present

## 2018-12-01 ENCOUNTER — Encounter: Payer: Self-pay | Admitting: Physician Assistant

## 2018-12-01 ENCOUNTER — Ambulatory Visit (INDEPENDENT_AMBULATORY_CARE_PROVIDER_SITE_OTHER): Payer: Medicare Other | Admitting: Physician Assistant

## 2018-12-01 VITALS — BP 156/82 | HR 52 | Temp 97.5°F | Resp 16 | Wt 227.0 lb

## 2018-12-01 DIAGNOSIS — I5023 Acute on chronic systolic (congestive) heart failure: Secondary | ICD-10-CM | POA: Diagnosis not present

## 2018-12-01 DIAGNOSIS — M1000 Idiopathic gout, unspecified site: Secondary | ICD-10-CM | POA: Diagnosis not present

## 2018-12-01 DIAGNOSIS — I5020 Unspecified systolic (congestive) heart failure: Secondary | ICD-10-CM | POA: Diagnosis not present

## 2018-12-01 DIAGNOSIS — I509 Heart failure, unspecified: Secondary | ICD-10-CM

## 2018-12-01 MED ORDER — COLCHICINE 0.6 MG PO TABS: ORAL_TABLET | ORAL | 0 refills | Status: DC

## 2018-12-01 MED ORDER — TORSEMIDE 20 MG PO TABS: 40.0000 mg | ORAL_TABLET | Freq: Every day | ORAL | 0 refills | Status: DC

## 2018-12-01 NOTE — Patient Instructions (Signed)
You need to be weighing yourself DAILY the first thing in the morning when you get up. You need to [place] if you gain 2 pounds in one day or more than 5 pounds in one week. If your doctor changes your medication, you need to [place] unless otherwise directed by your doctor.    Heart Failure Action Plan A heart failure action plan helps you understand what to do when you have symptoms of heart failure. Follow the plan that was created by you and your health care provider. Review your plan each time you visit your health care provider. Red zone These signs and symptoms mean you should get medical help right away:  You have trouble breathing when resting.  You have a dry cough that is getting worse.  You have swelling or pain in your legs or abdomen that is getting worse.  You suddenly gain more than 2-3 lb (0.9-1.4 kg) in a day, or more than 5 lb (2.3 kg) in one week. This amount may be more or less depending on your condition.  You have trouble staying awake or you feel confused.  You have chest pain.  You do not have an appetite.  You pass out. If you experience any of these symptoms:  Call your local emergency services (911 in the [place] right away or seek help at the emergency department of the nearest hospital. Yellow zone These signs and symptoms mean your condition may be getting worse and you should make some changes:  You have trouble breathing when you are active or you need to sleep with extra pillows.  You have swelling in your legs or abdomen.  You gain 2-3 lb (0.9-1.4 kg) in one day, or 5 lb (2.3 kg) in one week. This amount may be more or less depending on your condition.  You get tired easily.  You have trouble sleeping.  You have a dry cough. If you experience any of these symptoms:  Contact your health care provider within the next day.  Your health care provider may adjust your medicines. Green zone These signs mean you are  doing well and can continue what you are doing:  You do not have shortness of breath.  You have very little swelling or no new swelling.  Your weight is stable (no gain or loss).  You have a normal activity level.  You do not have chest pain or any other new symptoms. Follow these instructions at home:  Take over-the-counter and prescription medicines only as told by your health care provider.  Weigh yourself daily. Your target weight is __________ lb (__________ kg). ? Call your health care provider if you gain more than __________ lb (__________ kg) in a day, or more than __________ lb (__________ kg) in one week.  Eat a heart-healthy diet. Work with a diet and nutrition specialist (dietitian) to create an eating plan that is best for you.  Keep all follow-up visits as told by your health care provider. This is important. Where to find more information  American Heart Association: www.heart.org Summary  Follow the action plan that was created by you and your health care provider.  Get help right away if you have any symptoms in the Red zone. This information is not intended to replace advice given to you by your health care provider. Make sure you discuss any questions you have with your health care provider. Document Released: 12/21/2016 Document Revised: 07/16/2017 Document Reviewed: 12/21/2016 Elsevier Interactive Patient  Education  2019 [employer].

## 2018-12-01 NOTE — Progress Notes (Signed)
Patient: Taylor [name] Male    DOB: February 21, 1941   78 y.o.   MRN: [id number] Visit Date: 12/01/2018  Today's Provider: Trinna Post, PA-C   Chief Complaint  Patient presents with  . Congestive Heart Failure   Subjective:     HPI Patient with a history of systolic heart failure NYHA class 3 is here today c/o fluid build up since last week. [place] also reports 10 lbs weight gain. Patient was changed from furosemide to torsemide by Dr. Hinton Lovely in October 2019 with good resolution of swelling. He reports he ran out of his torsemide one week ago and took his furosemide because he had it on hand. He did not call for refill of torsemide.  Patient reports shortness of breath. Patient reports he has not seen or called cardiologist.   Also asking for refill of indomethacin for gout. Currently on allopurinol.   Wt Readings from Last 3 Encounters:  12/01/18 227 lb (103 kg)  09/21/18 216 lb (98 kg)  08/26/18 220 lb (99.8 kg)    Allergies  Allergen Reactions  . Amlodipine Besylate Swelling    Had a reaction when taking with colcrys   . Crestor [Rosuvastatin]     Muscle cramps and pain  . Rocephin [Ceftriaxone]     unknown     Current Outpatient Medications:  .  allopurinol (ZYLOPRIM) 300 MG tablet, TAKE 1 TABLET BY MOUTH EVERY DAY, Disp: 90 tablet, Rfl: 4 .  aspirin 81 MG EC tablet, Take 81 mg by mouth daily. , Disp: , Rfl:  .  atorvastatin (LIPITOR) 40 MG tablet, TAKE 1 TABLET BY MOUTH EVERY DAY, Disp: 90 tablet, Rfl: 4 .  carvedilol (COREG) 12.5 MG tablet, TAKE 1 TABLET BY MOUTH DAILY, Disp: 90 tablet, Rfl: 5 .  Cyanocobalamin (VITAMIN B-12 CR) 1000 MCG TBCR, Take 1,000 mcg by mouth daily. , Disp: , Rfl:  .  indomethacin (INDOCIN) 50 MG capsule, TAKE 1 CAPSULE BY MOUTH THREE TIMES DAILY AS NEEDED, Disp: 270 capsule, Rfl: 1 .  pantoprazole (PROTONIX) 40 MG tablet, TAKE 1 TABLET BY MOUTH EVERY DAY, Disp: 90 tablet, Rfl: 4 .  torsemide (DEMADEX) 20 MG tablet, TAKE 2 TABLETS BY  MOUTH DAILY( TAKE IN PLACE OF FUROSEMIDE), Disp: 180 tablet, Rfl: 0 .  triamcinolone ointment (KENALOG) 0.1 %, Apply 1 application topically 2 (two) times daily as needed (itching)., Disp: 30 g, Rfl: 3 .  valsartan (DIOVAN) 160 MG tablet, TAKE 1 TABLET BY MOUTH DAILY( TAKE IN PLACE OF LOSARTAN), Disp: 90 tablet, Rfl: 0  Review of Systems  Constitutional: Negative.   Respiratory: Positive for chest tightness and shortness of breath. Negative for cough.   Cardiovascular: Positive for leg swelling. Negative for chest pain.    Social History   Tobacco Use  . Smoking status: Former Smoker    Packs/day: 1.00    Types: Cigarettes    Last attempt to quit: 11/26/1999    Years since quitting: 19.0  . Smokeless tobacco: Never Used  Substance Use Topics  . Alcohol use: No      Objective:   BP (!) 156/82 (BP Location: Left Arm, Patient Position: Sitting, Cuff Size: Normal)   Pulse (!) 52   Temp (!) 97.5 F (36.4 C) (Oral)   Resp 16   Wt 227 lb (103 kg)   SpO2 97%   BMI 32.57 kg/m  Vitals:   12/01/18 1032  BP: (!) 156/82  Pulse: (!) 52  Resp: 16  Temp: (!) 97.5 F (36.4 C)  TempSrc: Oral  SpO2: 97%  Weight: 227 lb (103 kg)     Physical Exam Constitutional:      Appearance: He is obese.  Cardiovascular:     Rate and Rhythm: Normal rate and regular rhythm.     Heart sounds: Normal heart sounds.  Pulmonary:     Effort: Pulmonary effort is normal.     Breath sounds: Rales present.     Comments: Slight crackles in lung bases.  Musculoskeletal:        General: No swelling.     Right lower leg: No edema.     Left lower leg: No edema.  Neurological:     Mental Status: He is alert. Mental status is at baseline.  Psychiatric:        Mood and Affect: Mood normal.        Behavior: Behavior normal.         Assessment & Plan    1. Systolic CHF with reduced left ventricular function, NYHA class 3 ([place])  Patient is volume overloaded on exam today. Change furosemide back to  torsemide. Have instructed patient that he should not make any medication changes without talking to his provider and that Dr. [name] Section would have been happy to refill that medication for him had he contacted the clinic or pharmacy. Have counseled on daily weighing as patient had stopped weighing himself because he "had a good trend" and that he needs to call for weight gain >2 pounds overnight or >5 pounds in one week. Will hold off on CXR for now. Follow up in 3 days to reassess fluid status.  - DG Chest 2 View; Future  2. Acute on chronic systolic congestive heart failure ([place])  - DG Chest 2 View; Future  3. Acute on chronic congestive heart failure, unspecified heart failure type ([facility])  - torsemide (DEMADEX) 20 MG tablet; Take 2 tablets (40 mg total) by mouth daily.  Dispense: 180 tablet; Refill: 0  4. Idiopathic gout, unspecified chronicity, unspecified site  Indomethacin not appropriate for cardiovascular disease. Fill as below for gout flares, consulted with Dr. [name] Section.  - colchicine 0.6 MG tablet; Take 2 pills on day 1. Then take 1 pill each of the following day until gout flare resolved.  Dispense: 30 tablet; Refill: 0  The entirety of the information documented in the History of Present Illness, Review of Systems and Physical Exam were personally obtained by me. Portions of this information were initially documented by Lynford Humphrey, CMA and reviewed by me for thoroughness and accuracy.       Trinna Post, PA-C  [facility]

## 2018-12-04 ENCOUNTER — Ambulatory Visit (INDEPENDENT_AMBULATORY_CARE_PROVIDER_SITE_OTHER): Payer: Medicare Other | Admitting: Physician Assistant

## 2018-12-04 ENCOUNTER — Telehealth: Payer: Self-pay

## 2018-12-04 ENCOUNTER — Ambulatory Visit
Admission: RE | Admit: 2018-12-04 | Discharge: 2018-12-04 | Disposition: A | Discharge status: Home / Self Care | Payer: Medicare Other | Source: Ambulatory Visit | Attending: Physician Assistant | Admitting: Physician Assistant

## 2018-12-04 ENCOUNTER — Ambulatory Visit
Admission: RE | Admit: 2018-12-04 | Discharge: 2018-12-04 | Disposition: A | Discharge status: Home / Self Care | Payer: Medicare Other | Attending: Physician Assistant | Admitting: Physician Assistant

## 2018-12-04 ENCOUNTER — Encounter: Payer: Self-pay | Admitting: Physician Assistant

## 2018-12-04 VITALS — BP 161/88 | HR 51 | Temp 97.6°F | Wt 218.4 lb

## 2018-12-04 DIAGNOSIS — I5020 Unspecified systolic (congestive) heart failure: Secondary | ICD-10-CM | POA: Diagnosis not present

## 2018-12-04 DIAGNOSIS — I5023 Acute on chronic systolic (congestive) heart failure: Secondary | ICD-10-CM | POA: Diagnosis not present

## 2018-12-04 DIAGNOSIS — R0602 Shortness of breath: Secondary | ICD-10-CM | POA: Diagnosis not present

## 2018-12-04 DIAGNOSIS — R0989 Other specified symptoms and signs involving the circulatory and respiratory systems: Secondary | ICD-10-CM

## 2018-12-04 NOTE — Telephone Encounter (Signed)
[email or web address]

## 2018-12-04 NOTE — Telephone Encounter (Signed)
Patient advised as below.  

## 2018-12-04 NOTE — Telephone Encounter (Signed)
-----   Message from Trinna Post, [place] sent at 12/04/2018  2:07 PM EST ----- CXR normal. Continue torsemide and please do not switch back to furosemide unless directed. Follow up with Dr. [name] Section as scheduled.

## 2018-12-04 NOTE — Progress Notes (Signed)
Patient: Taylor [name] Male    DOB: [birth year]   78 y.o.   MRN: [id number] Visit Date: 12/04/2018  Today's Provider: Trinna Post, PA-C   Chief Complaint  Patient presents with  . Congestive Heart Failure   Subjective:    HPI  Congestive Heart Failure Patient presents today for follow -up for CHF. Patient was advised to switch from lasix to torsemide as he had previously ran out of torsemide and began taking leftover Lasix. Patient states he has been nauseated since last visit. Patient states that his blood pressure was 200/109 last night 12/03/2018 but came down on recheck. Denies chest pain, dizziness, vomiting. He has lost 10 lbs since last visit and endorses more frequent urination. Denies lower extremity edema.   Wt Readings from Last 3 Encounters:  12/04/18 218 lb 6.4 oz (99.1 kg)  12/01/18 227 lb (103 kg)  09/21/18 216 lb (98 kg)      Allergies  Allergen Reactions  . Amlodipine Besylate Swelling    Had a reaction when taking with colcrys   . Crestor [Rosuvastatin]     Muscle cramps and pain  . Rocephin [Ceftriaxone]     unknown     Current Outpatient Medications:  .  allopurinol (ZYLOPRIM) 300 MG tablet, TAKE 1 TABLET BY MOUTH EVERY DAY, Disp: 90 tablet, Rfl: 4 .  aspirin 81 MG EC tablet, Take 81 mg by mouth daily. , Disp: , Rfl:  .  atorvastatin (LIPITOR) 40 MG tablet, TAKE 1 TABLET BY MOUTH EVERY DAY, Disp: 90 tablet, Rfl: 4 .  carvedilol (COREG) 12.5 MG tablet, TAKE 1 TABLET BY MOUTH DAILY, Disp: 90 tablet, Rfl: 5 .  colchicine 0.6 MG tablet, Take 2 pills on day 1. Then take 1 pill each of the following day until gout flare resolved., Disp: 30 tablet, Rfl: 0 .  Cyanocobalamin (VITAMIN B-12 CR) 1000 MCG TBCR, Take 1,000 mcg by mouth daily. , Disp: , Rfl:  .  indomethacin (INDOCIN) 50 MG capsule, TAKE 1 CAPSULE BY MOUTH THREE TIMES DAILY AS NEEDED, Disp: 270 capsule, Rfl: 1 .  pantoprazole (PROTONIX) 40 MG tablet, TAKE 1 TABLET BY MOUTH EVERY DAY, Disp:  90 tablet, Rfl: 4 .  torsemide (DEMADEX) 20 MG tablet, Take 2 tablets (40 mg total) by mouth daily., Disp: 180 tablet, Rfl: 0 .  triamcinolone ointment (KENALOG) 0.1 %, Apply 1 application topically 2 (two) times daily as needed (itching)., Disp: 30 g, Rfl: 3 .  valsartan (DIOVAN) 160 MG tablet, TAKE 1 TABLET BY MOUTH DAILY( TAKE IN PLACE OF LOSARTAN), Disp: 90 tablet, Rfl: 0  Review of Systems  Constitutional: Negative.   HENT: Negative.   Respiratory: Negative.   Gastrointestinal: Negative.   Neurological: Negative.     Social History   Tobacco Use  . Smoking status: Former Smoker    Packs/day: 1.00    Types: Cigarettes    Last attempt to quit: 11/26/1999    Years since quitting: 19.0  . Smokeless tobacco: Never Used  Substance Use Topics  . Alcohol use: No      Objective:   BP (!) 161/88 (BP Location: Left Arm, Patient Position: Sitting, Cuff Size: Normal)   Pulse (!) 51   Temp 97.6 F (36.4 C) (Oral)   Wt 218 lb 6.4 oz (99.1 kg)   SpO2 92%   BMI 31.34 kg/m  Vitals:   12/04/18 0841  BP: (!) 161/88  Pulse: (!) 51  Temp: 97.6 F (36.4 C)  TempSrc: Oral  SpO2: 92%  Weight: 218 lb 6.4 oz (99.1 kg)     Physical Exam Constitutional:      General: He is not in acute distress.    Appearance: Normal appearance.  Cardiovascular:     Rate and Rhythm: Normal rate and regular rhythm.     Heart sounds: Normal heart sounds.  Pulmonary:     Effort: Pulmonary effort is normal.     Breath sounds: Rales present.  Musculoskeletal:     Right lower leg: No edema.     Left lower leg: No edema.  Skin:    General: Skin is warm and dry.  Neurological:     General: No focal deficit present.     Mental Status: He is alert and oriented to person, place, and time.  Psychiatric:        Mood and Affect: Mood normal.        Behavior: Behavior normal.         Assessment & Plan    1. Systolic CHF with reduced left ventricular function, NYHA class 3 (HCC)  Continue  torsemide, do not go back to lasix. Continue other anti-HTN and follow up with Dr. [name] Section as schedueled. He is euvolemic today.   2. Lung crackles  Still hear some crackles, CXR to r/o infectious processes was negative. I have personally reviewed the image and agree with the findings.  The entirety of the information documented in the History of Present Illness, Review of Systems and Physical Exam were personally obtained by me. Portions of this information were initially documented by Lynford Humphrey, CMA and reviewed by me for thoroughness and accuracy.         Trinna Post, PA-C  [facility]

## 2018-12-04 NOTE — Patient Instructions (Signed)
Heart Failure Action Plan A heart failure action plan helps you understand what to do when you have symptoms of heart failure. Follow the plan that was created by you and your health care provider. Review your plan each time you visit your health care provider. Red zone These signs and symptoms mean you should get medical help right away:  You have trouble breathing when resting.  You have a dry cough that is getting worse.  You have swelling or pain in your legs or abdomen that is getting worse.  You suddenly gain more than 2-3 lb (0.9-1.4 kg) in a day, or more than 5 lb (2.3 kg) in one week. This amount may be more or less depending on your condition.  You have trouble staying awake or you feel confused.  You have chest pain.  You do not have an appetite.  You pass out. If you experience any of these symptoms:  Call your local emergency services (911 in the [place] right away or seek help at the emergency department of the nearest hospital. Yellow zone These signs and symptoms mean your condition may be getting worse and you should make some changes:  You have trouble breathing when you are active or you need to sleep with extra pillows.  You have swelling in your legs or abdomen.  You gain 2-3 lb (0.9-1.4 kg) in one day, or 5 lb (2.3 kg) in one week. This amount may be more or less depending on your condition.  You get tired easily.  You have trouble sleeping.  You have a dry cough. If you experience any of these symptoms:  Contact your health care provider within the next day.  Your health care provider may adjust your medicines. Green zone These signs mean you are doing well and can continue what you are doing:  You do not have shortness of breath.  You have very little swelling or no new swelling.  Your weight is stable (no gain or loss).  You have a normal activity level.  You do not have chest pain or any other new symptoms. Follow these instructions at  home:  Take over-the-counter and prescription medicines only as told by your health care provider.  Weigh yourself daily. Your target weight is __________ lb (__________ kg). ? Call your health care provider if you gain more than __________ lb (__________ kg) in a day, or more than __________ lb (__________ kg) in one week.  Eat a heart-healthy diet. Work with a diet and nutrition specialist (dietitian) to create an eating plan that is best for you.  Keep all follow-up visits as told by your health care provider. This is important. Where to find more information  American Heart Association: www.heart.org Summary  Follow the action plan that was created by you and your health care provider.  Get help right away if you have any symptoms in the Red zone. This information is not intended to replace advice given to you by your health care provider. Make sure you discuss any questions you have with your health care provider. Document Released: 12/21/2016 Document Revised: 07/16/2017 Document Reviewed: 12/21/2016 Elsevier Interactive Patient Education  2019 [employer].

## 2018-12-14 DIAGNOSIS — I6523 Occlusion and stenosis of bilateral carotid arteries: Secondary | ICD-10-CM | POA: Diagnosis not present

## 2018-12-14 DIAGNOSIS — I714 Abdominal aortic aneurysm, without rupture: Secondary | ICD-10-CM | POA: Diagnosis not present

## 2018-12-14 DIAGNOSIS — I5022 Chronic systolic (congestive) heart failure: Secondary | ICD-10-CM | POA: Diagnosis not present

## 2018-12-14 DIAGNOSIS — I25810 Atherosclerosis of coronary artery bypass graft(s) without angina pectoris: Secondary | ICD-10-CM | POA: Diagnosis not present

## 2018-12-14 DIAGNOSIS — I10 Essential (primary) hypertension: Secondary | ICD-10-CM | POA: Diagnosis not present

## 2018-12-22 ENCOUNTER — Other Ambulatory Visit: Payer: Self-pay | Admitting: Family Medicine

## 2018-12-28 DIAGNOSIS — I5022 Chronic systolic (congestive) heart failure: Secondary | ICD-10-CM | POA: Diagnosis not present

## 2018-12-28 DIAGNOSIS — I6523 Occlusion and stenosis of bilateral carotid arteries: Secondary | ICD-10-CM | POA: Diagnosis not present

## 2018-12-28 DIAGNOSIS — I25810 Atherosclerosis of coronary artery bypass graft(s) without angina pectoris: Secondary | ICD-10-CM | POA: Diagnosis not present

## 2019-01-11 DIAGNOSIS — I10 Essential (primary) hypertension: Secondary | ICD-10-CM | POA: Diagnosis not present

## 2019-01-11 DIAGNOSIS — E782 Mixed hyperlipidemia: Secondary | ICD-10-CM | POA: Diagnosis not present

## 2019-01-11 DIAGNOSIS — Z95810 Presence of automatic (implantable) cardiac defibrillator: Secondary | ICD-10-CM | POA: Diagnosis not present

## 2019-01-11 DIAGNOSIS — I25810 Atherosclerosis of coronary artery bypass graft(s) without angina pectoris: Secondary | ICD-10-CM | POA: Diagnosis not present

## 2019-01-11 DIAGNOSIS — I714 Abdominal aortic aneurysm, without rupture: Secondary | ICD-10-CM | POA: Diagnosis not present

## 2019-01-22 ENCOUNTER — Ambulatory Visit (INDEPENDENT_AMBULATORY_CARE_PROVIDER_SITE_OTHER): Payer: Medicare Other | Admitting: Family Medicine

## 2019-01-22 ENCOUNTER — Encounter: Payer: Self-pay | Admitting: Family Medicine

## 2019-01-22 ENCOUNTER — Other Ambulatory Visit: Payer: Self-pay | Admitting: Family Medicine

## 2019-01-22 VITALS — BP 146/72 | HR 54 | Temp 97.9°F | Resp 20 | Ht 70.0 in | Wt 221.0 lb

## 2019-01-22 DIAGNOSIS — N183 Chronic kidney disease, stage 3 unspecified: Secondary | ICD-10-CM

## 2019-01-22 DIAGNOSIS — I5020 Unspecified systolic (congestive) heart failure: Secondary | ICD-10-CM | POA: Diagnosis not present

## 2019-01-22 DIAGNOSIS — K130 Diseases of lips: Secondary | ICD-10-CM | POA: Diagnosis not present

## 2019-01-22 DIAGNOSIS — I10 Essential (primary) hypertension: Secondary | ICD-10-CM | POA: Diagnosis not present

## 2019-01-22 DIAGNOSIS — M1000 Idiopathic gout, unspecified site: Secondary | ICD-10-CM | POA: Diagnosis not present

## 2019-01-22 MED ORDER — INDOMETHACIN 50 MG PO CAPS: 50.0000 mg | ORAL_CAPSULE | Freq: Three times a day (TID) | ORAL | 1 refills | Status: DC | PRN

## 2019-01-22 MED ORDER — MUPIROCIN 2 % EX OINT: 1.0000 "application " | TOPICAL_OINTMENT | Freq: Two times a day (BID) | CUTANEOUS | 0 refills | Status: DC

## 2019-01-22 NOTE — Patient Instructions (Addendum)
.   Please review the attached list of medications and notify my office if there are any errors.   . Please bring all of your medications to every appointment so we can make sure that our medication list is the same as yours.    Let me know if the sore on your lip does not heal within a month of using the mupirocin. We can set up a referral to ENT specialist.

## 2019-01-22 NOTE — Progress Notes (Signed)
Patient: Taylor [name] Male    DOB: September 15, 1941   78 y.o.   MRN: [id number] Visit Date: 01/22/2019  Today's Provider: Lelon Huh, MD   Chief Complaint  Patient presents with  . Congestive Heart Failure  . fever blister   Subjective:     HPI Patient presents today for a follow up. He was last seen in the office 1 month ago by Marton Redwood On last visit, patient was advised to continue Torsemide 20mg  2 tablets daily. He had gone back to furosemide prior that visit. He had follow up with Dr. Nehemiah Massed last week and no medication changes were made.  Patient denies any chest pains, swelling in lower extremities, or shortness of breath since he was seen last. He days complain of poor endurance and legs feel weak when he exercises.   BP Readings from Last 3 Encounters:  01/22/19 (!) 146/72  12/04/18 (!) 161/88  12/01/18 (!) 156/82   Wt Readings from Last 3 Encounters:  01/22/19 221 lb (100.2 kg)  12/04/18 218 lb 6.4 oz (99.1 kg)  12/01/18 227 lb (103 kg)   He also requests rf for indomethacin. He has not had gout flare for several months, but likes to have some on hand in case of flare.   Patient also mentions that he has a "bump" on his upper lip that is painful. He has not used anything OTC for symptoms.     Allergies  Allergen Reactions  . Amlodipine Besylate Swelling    Had a reaction when taking with colcrys   . Crestor [Rosuvastatin]     Muscle cramps and pain  . Rocephin [Ceftriaxone]     unknown     Current Outpatient Medications:  .  allopurinol (ZYLOPRIM) 300 MG tablet, TAKE 1 TABLET BY MOUTH EVERY DAY, Disp: 90 tablet, Rfl: 4 .  aspirin 81 MG EC tablet, Take 81 mg by mouth daily. , Disp: , Rfl:  .  atorvastatin (LIPITOR) 40 MG tablet, TAKE 1 TABLET BY MOUTH EVERY DAY, Disp: 90 tablet, Rfl: 4 .  carvedilol (COREG) 12.5 MG tablet, TAKE 1 TABLET BY MOUTH DAILY, Disp: 90 tablet, Rfl: 5 .  colchicine 0.6 MG tablet, Take 2 pills on day 1. Then take 1 pill  each of the following day until gout flare resolved., Disp: 30 tablet, Rfl: 0 .  Cyanocobalamin (VITAMIN B-12 CR) 1000 MCG TBCR, Take 1,000 mcg by mouth daily. , Disp: , Rfl:  .  indomethacin (INDOCIN) 50 MG capsule, TAKE 1 CAPSULE BY MOUTH THREE TIMES DAILY AS NEEDED, Disp: 270 capsule, Rfl: 1 .  pantoprazole (PROTONIX) 40 MG tablet, TAKE 1 TABLET BY MOUTH EVERY DAY, Disp: 90 tablet, Rfl: 4 .  torsemide (DEMADEX) 20 MG tablet, Take 2 tablets (40 mg total) by mouth daily., Disp: 180 tablet, Rfl: 0 .  triamcinolone ointment (KENALOG) 0.1 %, Apply 1 application topically 2 (two) times daily as needed (itching)., Disp: 30 g, Rfl: 3 .  valsartan (DIOVAN) 160 MG tablet, TAKE 1 TABLET BY MOUTH DAILY( TAKE IN PLACE OF LOSARTAN), Disp: 90 tablet, Rfl: 0  Review of Systems  Constitutional: Negative for activity change, appetite change, chills, diaphoresis, fatigue, fever and unexpected weight change.  Respiratory: Negative for cough and shortness of breath.   Cardiovascular: Negative for chest pain, palpitations and leg swelling.  Musculoskeletal: Negative for arthralgias.  Neurological: Negative for dizziness, light-headedness and numbness.    Social History   Tobacco Use  . Smoking status: Former  Smoker    Packs/day: 1.00    Types: Cigarettes    Last attempt to quit: 11/26/1999    Years since quitting: 19.1  . Smokeless tobacco: Never Used  Substance Use Topics  . Alcohol use: No      Objective:   BP (!) 146/72 (BP Location: Right Arm, Patient Position: Sitting, Cuff Size: Large)   Pulse (!) 54   Temp 97.9 F (36.6 C)   Resp 20   Ht 5\' 10"  (1.778 m)   Wt 221 lb (100.2 kg)   SpO2 95%   BMI 31.71 kg/m  Vitals:   01/22/19 0808  BP: (!) 146/72  Pulse: (!) 54  Resp: 20  Temp: 97.9 F (36.6 C)  SpO2: 95%  Weight: 221 lb (100.2 kg)  Height: 5\' 10"  (1.778 m)     Physical Exam   General Appearance:    Alert, cooperative, no distress  ENT:   Small scaly lesion about 87mm in dm  left side of upper lip.   Eyes:    PERRL, conjunctiva/corneas clear, EOM's intact       Lungs:     Clear to auscultation bilaterally, respirations unlabored  Heart:    Regular rate and rhythm  Neurologic:   Awake, alert, oriented x 3. No apparent focal neurological           defect.          Assessment & Plan    1. Essential (primary) hypertension Improved since changing back to 2 daily torsemide. Follow up bp in 3 months. Consider increasing valsartan if remains elevated.   2. Lip lesion He admits to frequently picking on lesion. Will try - mupirocin ointment (BACTROBAN) 2 %; Apply 1 application topically 2 (two) times daily.  Dispense: 22 g; Refill: 0 If not healing he is to call back for ENT referral.   3. Idiopathic gout, unspecified chronicity, unspecified site Advised to only take NSAIDs rarely due to CKD. He would like refill- indomethacin (INDOCIN) 50 MG capsule; Take 1 capsule (50 mg total) by mouth 3 (three) times daily as needed (for gout flares).  Dispense: 30 capsule; Refill: 1 just in case.  - Uric acid  4. Chronic kidney disease (CKD), stage III (moderate) (HCC)  - Renal function panel  5. Systolic CHF with reduced left ventricular function, NYHA class 3 ([place]) Improved since getting back on 2 QD torsemide.     Lelon Huh, MD  [facility]

## 2019-01-23 LAB — RENAL FUNCTION PANEL
Albumin: 3.9 g/dL (ref 3.7–4.7)
BUN/Creatinine Ratio: 13 (ref 10–24)
BUN: 21 mg/dL (ref 8–27)
CO2: 27 mmol/L (ref 20–29)
Calcium: 9 mg/dL (ref 8.6–10.2)
Chloride: 101 mmol/L (ref 96–106)
Creatinine, Ser: 1.56 mg/dL — ABNORMAL HIGH (ref 0.76–1.27)
GFR calc Af Amer: 49 mL/min/{1.73_m2} — ABNORMAL LOW (ref >59)
GFR, EST NON AFRICAN AMERICAN: 43 mL/min/{1.73_m2} — AB (ref >59)
Glucose: 126 mg/dL — ABNORMAL HIGH (ref 65–99)
Phosphorus: 3.1 mg/dL (ref 2.8–4.1)
Potassium: 3.7 mmol/L (ref 3.5–5.2)
Sodium: 142 mmol/L (ref 134–144)

## 2019-01-23 LAB — URIC ACID: Uric Acid: 7.3 mg/dL (ref 3.7–8.6)

## 2019-01-27 ENCOUNTER — Telehealth: Payer: Self-pay

## 2019-01-27 DIAGNOSIS — M1000 Idiopathic gout, unspecified site: Secondary | ICD-10-CM

## 2019-01-27 MED ORDER — ALLOPURINOL 300 MG PO TABS: 600.0000 mg | ORAL_TABLET | Freq: Every day | ORAL | 12 refills | Status: DC

## 2019-01-27 NOTE — Telephone Encounter (Signed)
Patient advised and verbally voiced understanding. Prescription sent into pharmacy.  

## 2019-01-27 NOTE — Telephone Encounter (Signed)
-----   Message from Birdie Sons, MD sent at 01/26/2019  8:34 AM EST ----- Uric acid levels are still too high, need to increase allopurinol 300mg  two tablets daily. Please send prescription for #60, rf x 12

## 2019-02-09 ENCOUNTER — Telehealth: Payer: Self-pay | Admitting: Family Medicine

## 2019-02-09 NOTE — Telephone Encounter (Signed)
Ariel with [facility] called asking for an order with numbers for patient to have a new C-pap machine to replace his old machine.    Joylene Igo  [phone]  CB# [phone]  Thanks Con Memos

## 2019-02-10 ENCOUNTER — Ambulatory Visit (INDEPENDENT_AMBULATORY_CARE_PROVIDER_SITE_OTHER): Payer: Medicare Other | Admitting: Family Medicine

## 2019-02-10 ENCOUNTER — Other Ambulatory Visit: Payer: Self-pay

## 2019-02-10 ENCOUNTER — Encounter: Payer: Self-pay | Admitting: Family Medicine

## 2019-02-10 VITALS — BP 138/72 | HR 67 | Temp 97.7°F | Resp 18

## 2019-02-10 DIAGNOSIS — G4733 Obstructive sleep apnea (adult) (pediatric): Secondary | ICD-10-CM

## 2019-02-10 DIAGNOSIS — Z9989 Dependence on other enabling machines and devices: Secondary | ICD-10-CM | POA: Diagnosis not present

## 2019-02-10 DIAGNOSIS — T148XXA Other injury of unspecified body region, initial encounter: Secondary | ICD-10-CM

## 2019-02-10 NOTE — Progress Notes (Signed)
Patient: Taylor [name] Male    DOB: 10-09-1941   78 y.o.   MRN: [id number] Visit Date: 02/10/2019  Today's Provider: Lelon Huh, MD   Chief Complaint  Patient presents with  . Sleep Apnea   Subjective:     HPI  Follow up for OSA:  The patient was last seen for this 10 months ago. Changes made at last visit include none.  He reports good compliance with treatment. Patient states his CPAP machine stopped working 2 weeks ago. He has had his current CPAP machine for over 15 years. He uses it every night and states he has much more energy and is not slepey during the day when it was working. He does not know the settings of his current machine. His last sleepy study was about 20 years ago at which time he was seen by Dr. Delilah Shan  He also has several splinters on the volar aspect of left hypothenar eminence that he got while refinishing a wooden deck.    ------------------------------------------------------------------------------------  Allergies  Allergen Reactions  . Amlodipine Besylate Swelling    Had a reaction when taking with colcrys   . Crestor [Rosuvastatin]     Muscle cramps and pain  . Rocephin [Ceftriaxone]     unknown     Current Outpatient Medications:  .  allopurinol (ZYLOPRIM) 300 MG tablet, Take 2 tablets (600 mg total) by mouth daily., Disp: 60 tablet, Rfl: 12 .  aspirin 81 MG EC tablet, Take 81 mg by mouth daily. , Disp: , Rfl:  .  atorvastatin (LIPITOR) 40 MG tablet, TAKE 1 TABLET BY MOUTH EVERY DAY, Disp: 90 tablet, Rfl: 4 .  carvedilol (COREG) 12.5 MG tablet, TAKE 1 TABLET BY MOUTH DAILY, Disp: 90 tablet, Rfl: 5 .  colchicine 0.6 MG tablet, Take 2 pills on day 1. Then take 1 pill each of the following day until gout flare resolved., Disp: 30 tablet, Rfl: 0 .  Cyanocobalamin (VITAMIN B-12 CR) 1000 MCG TBCR, Take 1,000 mcg by mouth daily. , Disp: , Rfl:  .  indomethacin (INDOCIN) 50 MG capsule, Take 1 capsule (50 mg total) by mouth 3 (three) times  daily as needed (for gout flares)., Disp: 30 capsule, Rfl: 1 .  mupirocin ointment (BACTROBAN) 2 %, Apply 1 application topically 2 (two) times daily., Disp: 22 g, Rfl: 0 .  pantoprazole (PROTONIX) 40 MG tablet, TAKE 1 TABLET BY MOUTH EVERY DAY, Disp: 90 tablet, Rfl: 4 .  torsemide (DEMADEX) 20 MG tablet, Take 2 tablets (40 mg total) by mouth daily., Disp: 180 tablet, Rfl: 0 .  triamcinolone ointment (KENALOG) 0.1 %, Apply 1 application topically 2 (two) times daily as needed (itching)., Disp: 30 g, Rfl: 3 .  valsartan (DIOVAN) 160 MG tablet, TAKE 1 TABLET BY MOUTH DAILY( TAKE IN PLACE OF LOSARTAN), Disp: 90 tablet, Rfl: 0  Review of Systems  Constitutional: Negative for appetite change, chills and fever.  Respiratory: Negative for chest tightness, shortness of breath and wheezing.   Cardiovascular: Negative for chest pain and palpitations.  Gastrointestinal: Negative for abdominal pain, nausea and vomiting.  Skin:       Wood splinters in his hand x 2 days    Social History   Tobacco Use  . Smoking status: Former Smoker    Packs/day: 1.00    Types: Cigarettes    Last attempt to quit: 11/26/1999    Years since quitting: 19.2  . Smokeless tobacco: Never Used  Substance Use Topics  .  Alcohol use: No      Objective:   BP 138/72 (BP Location: Right Leg, Patient Position: Sitting, Cuff Size: Large)   Pulse 67   Temp 97.7 F (36.5 C) (Oral)   Resp 18   SpO2 99% Comment: room air Vitals:   02/10/19 1339  BP: 138/72  Pulse: 67  Resp: 18  Temp: 97.7 F (36.5 C)  TempSrc: Oral  SpO2: 99%     Physical Exam  General appearance: alert, well developed, well nourished, cooperative and in no distress Head: Normocephalic, without obvious abnormality, atraumatic Respiratory: Respirations even and unlabored, normal respiratory rate Extremities: several small splinters per HPI.      Assessment & Plan    1. OSA on CPAP Current machine is 77 years old and motor has recently burnt  out. Order for new machine sent to [facility] as pre telephone message 02/09/2019   2. Splinter in skin 5 splinters removed from left hand. Patient tolerated well.      Lelon Huh, MD  [facility]

## 2019-02-11 NOTE — Patient Instructions (Signed)
.   Please review the attached list of medications and notify my office if there are any errors.   . Please bring all of your medications to every appointment so we can make sure that our medication list is the same as yours.   

## 2019-02-11 NOTE — Telephone Encounter (Signed)
Order printed to be faxed.

## 2019-02-15 ENCOUNTER — Other Ambulatory Visit: Payer: Self-pay | Admitting: Family Medicine

## 2019-02-22 ENCOUNTER — Telehealth: Payer: Self-pay

## 2019-02-22 NOTE — Telephone Encounter (Signed)
That's fine

## 2019-02-22 NOTE — Telephone Encounter (Signed)
Is it okay to fax office notes for 02/10/2019  Thanks,   -Mickel Baas

## 2019-02-22 NOTE — Telephone Encounter (Signed)
Ariel from [facility] called requesting a copy of the last office note for his CPAP supplies. Their fax number is [phone].    Call back is [phone]

## 2019-02-23 ENCOUNTER — Telehealth: Payer: Self-pay | Admitting: *Deleted

## 2019-02-23 ENCOUNTER — Other Ambulatory Visit: Payer: Self-pay | Admitting: Physician Assistant

## 2019-02-23 DIAGNOSIS — I509 Heart failure, unspecified: Secondary | ICD-10-CM

## 2019-02-23 NOTE — Telephone Encounter (Signed)
Please Review

## 2019-02-23 NOTE — Telephone Encounter (Signed)
A representative from [facility] called office stating they need a copy of patient's sleep study faxed to them at [phone]. Please advise?

## 2019-02-23 NOTE — Telephone Encounter (Signed)
Therapist, art system is down today. Will recheck at a later time to see if a copy of last sleep study is in that record.

## 2019-02-23 NOTE — Telephone Encounter (Signed)
Done. Office note faxed to number listed below.

## 2019-02-24 NOTE — Telephone Encounter (Signed)
Sleep study faxed to [facility].    Thanks,   -Mickel Baas

## 2019-03-04 DIAGNOSIS — G4733 Obstructive sleep apnea (adult) (pediatric): Secondary | ICD-10-CM | POA: Diagnosis not present

## 2019-03-04 DIAGNOSIS — G4730 Sleep apnea, unspecified: Secondary | ICD-10-CM | POA: Diagnosis not present

## 2019-03-31 ENCOUNTER — Encounter: Payer: Self-pay | Admitting: Family Medicine

## 2019-03-31 ENCOUNTER — Other Ambulatory Visit: Payer: Self-pay

## 2019-03-31 ENCOUNTER — Ambulatory Visit (INDEPENDENT_AMBULATORY_CARE_PROVIDER_SITE_OTHER): Payer: Medicare Other | Admitting: Family Medicine

## 2019-03-31 VITALS — BP 101/67 | HR 51 | Temp 97.7°F | Resp 16 | Wt 211.0 lb

## 2019-03-31 DIAGNOSIS — K130 Diseases of lips: Secondary | ICD-10-CM | POA: Diagnosis not present

## 2019-03-31 DIAGNOSIS — E790 Hyperuricemia without signs of inflammatory arthritis and tophaceous disease: Secondary | ICD-10-CM | POA: Insufficient documentation

## 2019-03-31 MED ORDER — TRIAMCINOLONE ACETONIDE 0.1 % EX OINT: 1.0000 "application " | TOPICAL_OINTMENT | Freq: Two times a day (BID) | CUTANEOUS | 3 refills | Status: DC | PRN

## 2019-03-31 NOTE — Patient Instructions (Signed)
.   Please review the attached list of medications and notify my office if there are any errors.   . Please bring all of your medications to every appointment so we can make sure that our medication list is the same as yours.   

## 2019-03-31 NOTE — Progress Notes (Signed)
Patient: Taylor [name] Male    DOB: 1941/11/11   78 y.o.   MRN: [id number] Visit Date: 03/31/2019  Today's Provider: Lelon Huh, MD   Chief Complaint  Patient presents with  . Follow-up   Subjective:     HPI  Follow up for Lip lesion  The patient was last seen for this 2 months ago. Changes made at last visit include treating with Mupirocin ointment. If not healing, would refer to ENT.  He reports good compliance with treatment. He feels that condition is Unchanged. Patient feels that the lesion on his lip has not improved since the last visit. He is not having side effects.   ------------------------------------------------------------------------------------  Is also here to follow up on gout and hyperuricemia.  Lab Results  Component Value Date   LABURIC 7.3 01/22/2019   Since February he has been much more strict with diet and doubled dose of allopurinol to 2 tablets daily and has not had any gout glares. Is no longer taking colchicine.    Allergies  Allergen Reactions  . Amlodipine Besylate Swelling    Had a reaction when taking with colcrys   . Crestor [Rosuvastatin]     Muscle cramps and pain  . Rocephin [Ceftriaxone]     unknown     Current Outpatient Medications:  .  allopurinol (ZYLOPRIM) 300 MG tablet, Take 2 tablets (600 mg total) by mouth daily., Disp: 60 tablet, Rfl: 12 .  aspirin 81 MG EC tablet, Take 81 mg by mouth daily. , Disp: , Rfl:  .  atorvastatin (LIPITOR) 40 MG tablet, TAKE 1 TABLET BY MOUTH EVERY DAY, Disp: 90 tablet, Rfl: 4 .  carvedilol (COREG) 12.5 MG tablet, TAKE 1 TABLET BY MOUTH DAILY, Disp: 90 tablet, Rfl: 5 .  Cyanocobalamin (VITAMIN B-12 CR) 1000 MCG TBCR, Take 1,000 mcg by mouth daily. , Disp: , Rfl:  .  indomethacin (INDOCIN) 50 MG capsule, Take 1 capsule (50 mg total) by mouth 3 (three) times daily as needed (for gout flares)., Disp: 30 capsule, Rfl: 1 .  losartan (COZAAR) 50 MG tablet, Take 2 tablets (100 mg total)  by mouth daily., Disp: 90 tablet, Rfl: 3 .  mupirocin ointment (BACTROBAN) 2 %, Apply 1 application topically 2 (two) times daily., Disp: 22 g, Rfl: 0 .  pantoprazole (PROTONIX) 40 MG tablet, TAKE 1 TABLET BY MOUTH EVERY DAY, Disp: 90 tablet, Rfl: 4 .  torsemide (DEMADEX) 20 MG tablet, TAKE 2 TABLETS(40 MG) BY MOUTH DAILY, Disp: 180 tablet, Rfl: 0 .  triamcinolone ointment (KENALOG) 0.1 %, Apply 1 application topically 2 (two) times daily as needed (itching)., Disp: 30 g, Rfl: 3 .  valsartan (DIOVAN) 160 MG tablet, TAKE 1 TABLET BY MOUTH DAILY( TAKE IN PLACE OF LOSARTAN), Disp: 90 tablet, Rfl: 0 .  colchicine 0.6 MG tablet, Take 2 pills on day 1. Then take 1 pill each of the following day until gout flare resolved. (Patient not taking: Reported on 03/31/2019), Disp: 30 tablet, Rfl: 0  Review of Systems  Constitutional: Negative for appetite change, chills and fever.  Respiratory: Negative for chest tightness, shortness of breath and wheezing.   Cardiovascular: Negative for chest pain and palpitations.  Gastrointestinal: Negative for abdominal pain, nausea and vomiting.  Skin:       Lesion on upper lip    Social History   Tobacco Use  . Smoking status: Former Smoker    Packs/day: 1.00    Types: Cigarettes  Last attempt to quit: 11/26/1999    Years since quitting: 19.3  . Smokeless tobacco: Never Used  Substance Use Topics  . Alcohol use: No      Objective:   BP 101/67 (BP Location: Left Arm, Cuff Size: Large)   Pulse (!) 51   Temp 97.7 F (36.5 C) (Oral)   Resp 16   Wt 211 lb (95.7 kg)   BMI 30.28 kg/m  Vitals:   03/31/19 0831 03/31/19 0834  BP: (!) 90/54 101/67  Pulse: (!) 51   Resp: 16   Temp: 97.7 F (36.5 C)   TempSrc: Oral   Weight: 211 lb (95.7 kg)      Physical Exam  ENT:   Small irregularly pigmented papular lesion about 82mm in dm left side of upper lip.       Assessment & Plan    1. Lip lesion Persistent for over 2 months.  - Ambulatory referral to  ENT  2. Hyperuricemia Tolerating increased dose of allopurinol with no gout flares since dose was doubled in February.      Lelon Huh, MD  [facility]

## 2019-04-02 ENCOUNTER — Ambulatory Visit: Payer: Self-pay

## 2019-04-03 DIAGNOSIS — G4730 Sleep apnea, unspecified: Secondary | ICD-10-CM | POA: Diagnosis not present

## 2019-04-05 DIAGNOSIS — D3701 Neoplasm of uncertain behavior of lip: Secondary | ICD-10-CM | POA: Diagnosis not present

## 2019-04-05 DIAGNOSIS — K130 Diseases of lips: Secondary | ICD-10-CM | POA: Diagnosis not present

## 2019-04-05 DIAGNOSIS — D485 Neoplasm of uncertain behavior of skin: Secondary | ICD-10-CM | POA: Diagnosis not present

## 2019-04-07 ENCOUNTER — Ambulatory Visit (INDEPENDENT_AMBULATORY_CARE_PROVIDER_SITE_OTHER): Payer: Medicare Other

## 2019-04-07 DIAGNOSIS — Z0000 Encounter for general adult medical examination without abnormal findings: Secondary | ICD-10-CM | POA: Diagnosis not present

## 2019-04-07 NOTE — Patient Instructions (Addendum)
Taylor [name] , Thank you for taking time to come for your Medicare Wellness Visit. I appreciate your ongoing commitment to your health goals. Please review the following plan we discussed and let me know if I can assist you in the future.   Screening recommendations/referrals: Colonoscopy: No longer required.  Recommended yearly ophthalmology/optometry visit for glaucoma screening and checkup Recommended yearly dental visit for hygiene and checkup  Vaccinations: Influenza vaccine: Up to date Pneumococcal vaccine: Completed series Tdap vaccine: Pt declines today.  Shingles vaccine: Pt declines today.     Advanced directives: Please bring a copy of your POA (Power of Attorney) and/or Living Will to your next appointment.   Conditions/risks identified: Continue to decrease portion sizes and eat a balanced diet to help aid with weight loss. Marland Kitchen   Next appointment: 04/21/19 with Dr [name] Section.   Preventive Care 75 Years and Older, Male Preventive care refers to lifestyle choices and visits with your health care provider that can promote health and wellness. What does preventive care include?  A yearly physical exam. This is also called an annual well check.  Dental exams once or twice a year.  Routine eye exams. Ask your health care provider how often you should have your eyes checked.  Personal lifestyle choices, including:  Daily care of your teeth and gums.  Regular physical activity.  Eating a healthy diet.  Avoiding tobacco and drug use.  Limiting alcohol use.  Practicing safe sex.  Taking low doses of aspirin every day.  Taking vitamin and mineral supplements as recommended by your health care provider. What happens during an annual well check? The services and screenings done by your health care provider during your annual well check will depend on your age, overall health, lifestyle risk factors, and family history of disease. Counseling  Your health care provider may ask  you questions about your:  Alcohol use.  Tobacco use.  Drug use.  Emotional well-being.  Home and relationship well-being.  Sexual activity.  Eating habits.  History of falls.  Memory and ability to understand (cognition).  Work and work Statistician. Screening  You may have the following tests or measurements:  Height, weight, and BMI.  Blood pressure.  Lipid and cholesterol levels. These may be checked every 5 years, or more frequently if you are over 48 years old.  Skin check.  Lung cancer screening. You may have this screening every year starting at age 40 if you have a 30-pack-year history of smoking and currently smoke or have quit within the past 15 years.  Fecal occult blood test (FOBT) of the stool. You may have this test every year starting at age 82.  Flexible sigmoidoscopy or colonoscopy. You may have a sigmoidoscopy every 5 years or a colonoscopy every 10 years starting at age 65.  Prostate cancer screening. Recommendations will vary depending on your family history and other risks.  Hepatitis C blood test.  Hepatitis B blood test.  Sexually transmitted disease (STD) testing.  Diabetes screening. This is done by checking your blood sugar (glucose) after you have not eaten for a while (fasting). You may have this done every 1-3 years.  Abdominal aortic aneurysm (AAA) screening. You may need this if you are a current or former smoker.  Osteoporosis. You may be screened starting at age 53 if you are at high risk. Talk with your health care provider about your test results, treatment options, and if necessary, the need for more tests. Vaccines  Your health care provider  may recommend certain vaccines, such as:  Influenza vaccine. This is recommended every year.  Tetanus, diphtheria, and acellular pertussis (Tdap, Td) vaccine. You may need a Td booster every 10 years.  Zoster vaccine. You may need this after age 79.  Pneumococcal 13-valent conjugate  (PCV13) vaccine. One dose is recommended after age 24.  Pneumococcal polysaccharide (PPSV23) vaccine. One dose is recommended after age 10. Talk to your health care provider about which screenings and vaccines you need and how often you need them. This information is not intended to replace advice given to you by your health care provider. Make sure you discuss any questions you have with your health care provider. Document Released: 12/08/2015 Document Revised: 07/31/2016 Document Reviewed: 09/12/2015 Elsevier Interactive Patient Education  2017 [employer] Prevention in the Home Falls can cause injuries. They can happen to people of all ages. There are many things you can do to make your home safe and to help prevent falls. What can I do on the outside of my home?  Regularly fix the edges of walkways and driveways and fix any cracks.  Remove anything that might make you trip as you walk through a door, such as a raised step or threshold.  Trim any bushes or trees on the path to your home.  Use bright outdoor lighting.  Clear any walking paths of anything that might make someone trip, such as rocks or tools.  Regularly check to see if handrails are loose or broken. Make sure that both sides of any steps have handrails.  Any raised decks and porches should have guardrails on the edges.  Have any leaves, snow, or ice cleared regularly.  Use sand or salt on walking paths during winter.  Clean up any spills in your garage right away. This includes oil or grease spills. What can I do in the bathroom?  Use night lights.  Install grab bars by the toilet and in the tub and shower. Do not use towel bars as grab bars.  Use non-skid mats or decals in the tub or shower.  If you need to sit down in the shower, use a plastic, non-slip stool.  Keep the floor dry. Clean up any water that spills on the floor as soon as it happens.  Remove soap buildup in the tub or shower  regularly.  Attach bath mats securely with double-sided non-slip rug tape.  Do not have throw rugs and other things on the floor that can make you trip. What can I do in the bedroom?  Use night lights.  Make sure that you have a light by your bed that is easy to reach.  Do not use any sheets or blankets that are too big for your bed. They should not hang down onto the floor.  Have a firm chair that has side arms. You can use this for support while you get dressed.  Do not have throw rugs and other things on the floor that can make you trip. What can I do in the kitchen?  Clean up any spills right away.  Avoid walking on wet floors.  Keep items that you use a lot in easy-to-reach places.  If you need to reach something above you, use a strong step stool that has a grab bar.  Keep electrical cords out of the way.  Do not use floor polish or wax that makes floors slippery. If you must use wax, use non-skid floor wax.  Do not have throw rugs  and other things on the floor that can make you trip. What can I do with my stairs?  Do not leave any items on the stairs.  Make sure that there are handrails on both sides of the stairs and use them. Fix handrails that are broken or loose. Make sure that handrails are as long as the stairways.  Check any carpeting to make sure that it is firmly attached to the stairs. Fix any carpet that is loose or worn.  Avoid having throw rugs at the top or bottom of the stairs. If you do have throw rugs, attach them to the floor with carpet tape.  Make sure that you have a light switch at the top of the stairs and the bottom of the stairs. If you do not have them, ask someone to add them for you. What else can I do to help prevent falls?  Wear shoes that:  Do not have high heels.  Have rubber bottoms.  Are comfortable and fit you well.  Are closed at the toe. Do not wear sandals.  If you use a stepladder:  Make sure that it is fully  opened. Do not climb a closed stepladder.  Make sure that both sides of the stepladder are locked into place.  Ask someone to hold it for you, if possible.  Clearly mark and make sure that you can see:  Any grab bars or handrails.  First and last steps.  Where the edge of each step is.  Use tools that help you move around (mobility aids) if they are needed. These include:  Canes.  Walkers.  Scooters.  Crutches.  Turn on the lights when you go into a dark area. Replace any light bulbs as soon as they burn out.  Set up your furniture so you have a clear path. Avoid moving your furniture around.  If any of your floors are uneven, fix them.  If there are any pets around you, be aware of where they are.  Review your medicines with your doctor. Some medicines can make you feel dizzy. This can increase your chance of falling. Ask your doctor what other things that you can do to help prevent falls. This information is not intended to replace advice given to you by your health care provider. Make sure you discuss any questions you have with your health care provider. Document Released: 09/07/2009 Document Revised: 04/18/2016 Document Reviewed: 12/16/2014 Elsevier Interactive Patient Education  2017 [employer].

## 2019-04-07 NOTE — Progress Notes (Signed)
Subjective:   Taylor [name] is a 78 y.o. male who presents for Medicare Annual/Subsequent preventive examination.    This visit is being conducted through telemedicine due to the COVID-19 pandemic. This patient has given me verbal consent via doximity to conduct this visit, patient states they are participating from their home address. Some vital signs may be absent or patient reported.    Patient identification: identified by name, DOB, and current address  Review of Systems:  N/A  Cardiac Risk Factors include: advanced age (>65men, >53 women);hypertension;male gender;obesity (BMI >30kg/m2)     Objective:    Vitals: There were no vitals taken for this visit.  There is no height or weight on file to calculate BMI. Unable to obtain vitals due to visit being conducted via telephonically.   Advanced Directives 04/07/2019 05/07/2018 05/07/2018 03/31/2018 02/26/2017 10/22/2016 07/07/2015  Does Patient Have a Medical Advance Directive? Yes No No No Yes Yes Yes  Type of Paramedic of [place];Living will - - - Living will Living will Living will  Copy of [place] in Chart? No - copy requested - - - - - -  Would patient like information on creating a medical advance directive? - No - Patient declined No - Patient declined No - Patient declined - - -    Tobacco Social History   Tobacco Use  Smoking Status Former Smoker  . Packs/day: 1.00  . Types: Cigarettes  . Last attempt to quit: 11/26/1999  . Years since quitting: 19.3  Smokeless Tobacco Never Used     Counseling given: Not Answered   Clinical Intake:  Pre-visit preparation completed: Yes  Pain Score: 0-No pain     Nutritional Status: BMI > 30  Obese Nutritional Risks: Non-healing wound(Biopsy taken from ENT, results will be in tomorrow 04/08/19) Diabetes: No  How often do you need to have someone help you when you read instructions, pamphlets, or other written materials from your  doctor or pharmacy?: 1 - Never  Interpreter Needed?: No  Information entered by :: [facility], LPN  Past Medical History:  Diagnosis Date  . AAA (abdominal aortic aneurysm) [facility]) 06/03/2007   [facility]; Dr. Kellie Simmering  . AICD (automatic cardioverter/defibrillator) present   . Barrett's esophagus   . Bladder cancer ([place])   . Bradycardia   . CAD (coronary artery disease)   . CHF (congestive heart failure) ([place])   . Cluster headache   . DDD (degenerative disc disease), lumbar   . Dyspnea    WITH EXERTION  . Edema    LEFT ANKLE  . Fracture of skull base ([place]) 1997   due to fall  . GERD (gastroesophageal reflux disease)   . Gout   . History of bladder cancer 12/1995  . Hyperlipidemia   . Hypertension   . Malignant melanoma ([place]) 12/2012   right dorsal forearm excised  . Myocardial infarction ([place])    LAST 2014  . Osteoarthritis of knee   . Pacemaker 10/10/2006  . Pneumonia    2016  . Pre-diabetes   . Psoriasis   . Rib fracture 1997   due to fall  . Sleep apnea    CPAP  . Stroke ([place])   . Venous incompetence    Past Surgical History:  Procedure Laterality Date  . ABDOMINAL AORTIC ANEURYSM REPAIR  06/03/2007   [facility]; Dr. Kellie Simmering  . ANGIOPLASTY  1994   MI  . BLADDER TUMOR EXCISION  12/1995  .  CATARACT EXTRACTION W/PHACO Left 10/22/2016   Procedure: CATARACT EXTRACTION PHACO AND INTRAOCULAR LENS PLACEMENT (IOC);  Surgeon: Birder Robson, MD;  Location: [facility] ORS;  Service: Ophthalmology;  Laterality: Left;  [place] 47.7 AP% 18.4 CDE 8.78 Fluid pack lot # [id number]  . CATARACT EXTRACTION W/PHACO Right 12/10/2016   Procedure: CATARACT EXTRACTION PHACO AND INTRAOCULAR LENS PLACEMENT (IOC);  Surgeon: Birder Robson, MD;  Location: [facility] ORS;  Service: Ophthalmology;  Laterality: Right;  [place] 00:39 AP% 23.3 CDE 9.13 Fluid pack lot # [id number]  . CORONARY ANGIOPLASTY     STENTS X 5  . CORONARY ARTERY BYPASS GRAFT  09/22/2006   four  .  ELBOW BURSA SURGERY     DUE TO GOUT  . INSERT / REPLACE / REMOVE PACEMAKER    . MELANOMA EXCISION  12/2012   Right forearm   Family History  Problem Relation Age of Onset  . Cancer Mother        Melanoma skin cancer  . Heart attack Father 38  . Cancer Father        throat cancer  . Arthritis Brother    Social History   Socioeconomic History  . Marital status: Married    Spouse name: Not on file  . Number of children: 3  . Years of education: Not on file  . Highest education level: 12th grade  Occupational History  . 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 Financial resource strain: Not hard at all  . Food insecurity:    Worry: Never true    Inability: Never true  . Transportation needs:    Medical: No    Non-medical: No  Tobacco Use  . Smoking status: Former Smoker    Packs/day: 1.00    Types: Cigarettes    Last attempt to quit: 11/26/1999    Years since quitting: 19.3  . Smokeless tobacco: Never Used  Substance and Sexual Activity  . Alcohol use: No  . Drug use: No  . Sexual activity: Not on file  Lifestyle  . Physical activity:    Days per week: 0 days    Minutes per session: 0 min  . Stress: Not at all  Relationships  . Social connections:    Talks on phone: Patient refused    Gets together: Patient refused    Attends religious service: Patient refused    Active member of club or organization: Patient refused    Attends meetings of clubs or organizations: Patient refused    Relationship status: Patient refused  Other Topics Concern  . Not on file  Social History Narrative   Lives at home with his family.  Independent at baseline.    Outpatient Encounter Medications as of 04/07/2019  Medication Sig  . allopurinol (ZYLOPRIM) 300 MG tablet Take 2 tablets (600 mg total) by mouth daily.  Marland Kitchen aspirin 81 MG EC tablet Take 81 mg by mouth daily.   Marland Kitchen atorvastatin (LIPITOR) 40 MG tablet TAKE 1 TABLET BY MOUTH EVERY DAY   . carvedilol (COREG) 12.5 MG tablet TAKE 1 TABLET BY MOUTH DAILY  . Cyanocobalamin (VITAMIN B-12 CR) 1000 MCG TBCR Take 1,000 mcg by mouth daily.   . indomethacin (INDOCIN) 50 MG capsule Take 1 capsule (50 mg total) by mouth 3 (three) times daily as needed (for gout flares).  . losartan (COZAAR) 50 MG tablet Take 2 tablets (100 mg total) by mouth daily.  . mupirocin ointment (BACTROBAN) 2 % Apply 1 application  topically 2 (two) times daily.  . pantoprazole (PROTONIX) 40 MG tablet TAKE 1 TABLET BY MOUTH EVERY DAY  . torsemide (DEMADEX) 20 MG tablet TAKE 2 TABLETS(40 MG) BY MOUTH DAILY  . triamcinolone ointment (KENALOG) 0.1 % Apply 1 application topically 2 (two) times daily as needed (itching).  . valsartan (DIOVAN) 160 MG tablet TAKE 1 TABLET BY MOUTH DAILY( TAKE IN PLACE OF LOSARTAN)  . colchicine 0.6 MG tablet Take 2 pills on day 1. Then take 1 pill each of the following day until gout flare resolved. (Patient not taking: Reported on 03/31/2019)   No facility-administered encounter medications on file as of 04/07/2019.     Activities of Daily Living In your present state of health, do you have any difficulty performing the following activities: 04/07/2019 05/07/2018  Hearing? Y N  Comment Has hearing loss in in left ear. Wears a hearing aid in the left ear.  -  Vision? N N  Comment Wears eyeglasses daily.  -  Difficulty concentrating or making decisions? N N  Walking or climbing stairs? N N  Dressing or bathing? N N  Doing errands, shopping? N N  Preparing Food and eating ? N -  Using the Toilet? N -  In the past six months, have you accidently leaked urine? N -  Do you have problems with loss of bowel control? N -  Managing your Medications? N -  Managing your Finances? N -  Housekeeping or managing your Housekeeping? N -  Some recent data might be hidden    Patient Care Team: Birdie Sons, MD as PCP - General (Family Medicine) Corey Skains, MD as Consulting Physician  (Internal Medicine) Birder Robson, MD as Referring Physician (Ophthalmology)   Assessment:   This is a routine wellness examination for Darran.  Exercise Activities and Dietary recommendations Current Exercise Habits: Home exercise routine, Type of exercise: Other - see comments(Rides bike and roller skates. ), Time (Minutes): 15, Frequency (Times/Week): 5, Weekly Exercise (Minutes/Week): 75, Intensity: Mild, Exercise limited by: None identified  Goals      Patient Stated   . diet (pt-stated)     Recommend to eat 3 meals a day with healthy snacks in between.       Other   . DIET - REDUCE PORTION SIZE     Recommend decreasing portion sizes by half and eating 3 small meals a day with two healthy snacks in between.        Fall Risk: Fall Risk  04/07/2019 03/31/2018 02/26/2017 05/12/2015  Falls in the past year? 1 No No No  Number falls in past yr: 0 - - -  Injury with Fall? 0 - - -  Risk for fall due to : - - - History of fall(s)  Follow up Falls prevention discussed - - -    FALL RISK PREVENTION PERTAINING TO THE HOME:  Any stairs in or around the home? Yes  If so, are there any without handrails? Yes   Home free of loose throw rugs in walkways, pet beds, electrical cords, etc? Yes  Adequate lighting in your home to reduce risk of falls? Yes   ASSISTIVE DEVICES UTILIZED TO PREVENT FALLS:  Life alert? No  Use of a cane, walker or w/c? No  Grab bars in the bathroom? No  Shower chair or bench in shower? No  Elevated toilet seat or a handicapped toilet? No   TIMED UP AND GO:  Was the test performed? No .  Depression Screen PHQ 2/9 Scores 04/07/2019 04/07/2019 03/31/2018 03/31/2018  PHQ - 2 Score 0 0 1 1  PHQ- 9 Score 0 - 7 -    Cognitive Function: Declined today.      6CIT Screen 03/31/2018 02/26/2017  What Year? 0 points 0 points  What month? 0 points 0 points  What time? 0 points 0 points  Count back from 20 0 points 0 points  Months in reverse 0 points 0 points   Repeat phrase 2 points 4 points  Total Score 2 4    Immunization History  Administered Date(s) Administered  . Influenza Split 09/18/2011  . Influenza, High Dose Seasonal PF 08/16/2014, 10/09/2016, 10/09/2017, 08/26/2018  . Influenza,inj,Quad PF,6+ Mos 12/20/2015  . Pneumococcal Conjugate-13 05/09/2014  . Pneumococcal Polysaccharide-23 05/12/2015    Qualifies for Shingles Vaccine? Yes . Due for Shingrix. Education has been provided regarding the importance of this vaccine. Pt has been advised to call insurance company to determine out of pocket expense. Advised may also receive vaccine at local pharmacy or Health Dept. Verbalized acceptance and understanding.  Tdap: Although this vaccine is not a covered service during a Wellness Exam, does the patient still wish to receive this vaccine today?  No .  Advised may receive this vaccine at local pharmacy or Health Dept. Aware to provide a copy of the vaccination record if obtained from local pharmacy or Health Dept. Verbalized acceptance and understanding.  Flu Vaccine: Up to date  Pneumococcal Vaccine: Up to date  Screening Tests Health Maintenance  Topic Date Due  . TETANUS/TDAP  10/06/1960  . INFLUENZA VACCINE  06/26/2019  . PNA vac Low Risk Adult  Completed   Cancer Screenings:  Colorectal Screening: No longer required.   Lung Cancer Screening: (Low Dose CT Chest recommended if Age 31-80 years, 30 pack-year currently smoking OR have quit w/in 15years.) does not qualify.   Additional Screening:  Vision Screening: Recommended annual ophthalmology exams for early detection of glaucoma and other disorders of the eye.  Dental Screening: Recommended annual dental exams for proper oral hygiene  Community Resource Referral:  CRR required this visit?  No        Plan:  I have personally reviewed and addressed the Medicare Annual Wellness questionnaire and have noted the following in the patient's chart:  A. Medical and social  history B. Use of alcohol, tobacco or illicit drugs  C. Current medications and supplements D. Functional ability and status E.  Nutritional status F.  Physical activity G. Advance directives H. List of other physicians I.  Hospitalizations, surgeries, and ER visits in previous 12 months J.  [place] such as hearing and vision if needed, cognitive and depression L. Referrals and appointments   In addition, I have reviewed and discussed with patient certain preventive protocols, quality metrics, and best practice recommendations. A written personalized care plan for preventive services as well as general preventive health recommendations were provided to patient.   Glendora Score, [place]  [id number]/98/3382 Nurse Health Advisor   Nurse Notes: Pt declined a future tetanus vaccine.

## 2019-04-09 ENCOUNTER — Other Ambulatory Visit: Payer: Self-pay | Admitting: Family Medicine

## 2019-04-09 NOTE — Telephone Encounter (Signed)
Please review

## 2019-04-21 ENCOUNTER — Ambulatory Visit: Payer: Self-pay | Admitting: Family Medicine

## 2019-05-04 DIAGNOSIS — G4730 Sleep apnea, unspecified: Secondary | ICD-10-CM | POA: Diagnosis not present

## 2019-05-11 ENCOUNTER — Encounter: Payer: Self-pay | Admitting: Family Medicine

## 2019-05-11 ENCOUNTER — Other Ambulatory Visit: Payer: Self-pay

## 2019-05-11 ENCOUNTER — Ambulatory Visit (INDEPENDENT_AMBULATORY_CARE_PROVIDER_SITE_OTHER): Payer: Medicare Other | Admitting: Family Medicine

## 2019-05-11 VITALS — BP 114/60 | HR 52 | Temp 98.3°F | Resp 16 | Wt 213.0 lb

## 2019-05-11 DIAGNOSIS — I10 Essential (primary) hypertension: Secondary | ICD-10-CM

## 2019-05-11 DIAGNOSIS — I2510 Atherosclerotic heart disease of native coronary artery without angina pectoris: Secondary | ICD-10-CM

## 2019-05-11 DIAGNOSIS — E790 Hyperuricemia without signs of inflammatory arthritis and tophaceous disease: Secondary | ICD-10-CM

## 2019-05-11 DIAGNOSIS — N183 Chronic kidney disease, stage 3 unspecified: Secondary | ICD-10-CM

## 2019-05-11 DIAGNOSIS — Z125 Encounter for screening for malignant neoplasm of prostate: Secondary | ICD-10-CM

## 2019-05-11 DIAGNOSIS — R7303 Prediabetes: Secondary | ICD-10-CM

## 2019-05-11 NOTE — Progress Notes (Signed)
Patient: Taylor [name] Male    DOB: 21-Aug-1941   78 y.o.   MRN: [id number] Visit Date: 05/11/2019  Today's Provider: Lelon Huh, MD   Chief Complaint  Patient presents with  . Hypertension  . Hyperlipidemia   Subjective:     HPI    Hypertension, follow-up:  BP Readings from Last 3 Encounters:  05/11/19 114/60  03/31/19 101/67  02/10/19 138/72    He was last seen for hypertension 3 months ago.  BP at that visit was 146/72. Management since that visit includes No changes. He reports excellent compliance with treatment. He is not having side effects.  He is exercising. He is adherent to low salt diet.   Outside blood pressures are checked occasionally.  He states his BP is usually around 140/70-80's. He is experiencing none.  Patient denies chest pain, dyspnea, lower extremity edema, palpitations and syncope.   Cardiovascular risk factors include advanced age (older than 57 for men, 43 for women), hypertension and male gender.  Use of agents associated with hypertension: none.     Weight trend: stable Wt Readings from Last 3 Encounters:  05/11/19 213 lb (96.6 kg)  03/31/19 211 lb (95.7 kg)  01/22/19 221 lb (100.2 kg)    Current diet: in general, a "healthy" diet       Lipid/Cholesterol, Follow-up:   Last seen for this 12 months ago.  Management since that visit includes no changes.  Last Lipid Panel:    Component Value Date/Time   CHOL 96 05/07/2018 1240   CHOL 107 04/02/2018 0901   TRIG 71 05/07/2018 1240   HDL 28 (L) 05/07/2018 1240   HDL 28 (L) 04/02/2018 0901   CHOLHDL 3.4 05/07/2018 1240   VLDL 14 05/07/2018 1240   LDLCALC 54 05/07/2018 1240   LDLCALC 57 04/02/2018 0901    He reports excellent compliance with treatment. He is not having side effects.   Wt Readings from Last 3 Encounters:  05/11/19 213 lb (96.6 kg)  03/31/19 211 lb (95.7 kg)  01/22/19 221 lb (100.2 kg)     ------------------------------------------------------------------------   Follow up gout Lab Results  Component Value Date   LABURIC 7.3 01/22/2019   Had allopurinol increased since then and states has not had any more gout flares.   Allergies  Allergen Reactions  . Amlodipine Besylate Swelling    Had a reaction when taking with colcrys   . Crestor [Rosuvastatin]     Muscle cramps and pain  . Rocephin [Ceftriaxone]     unknown     Current Outpatient Medications:  .  allopurinol (ZYLOPRIM) 300 MG tablet, Take 2 tablets (600 mg total) by mouth daily., Disp: 60 tablet, Rfl: 12 .  aspirin 81 MG EC tablet, Take 81 mg by mouth daily. , Disp: , Rfl:  .  atorvastatin (LIPITOR) 40 MG tablet, TAKE 1 TABLET BY MOUTH EVERY DAY, Disp: 90 tablet, Rfl: 4 .  carvedilol (COREG) 12.5 MG tablet, TAKE 1 TABLET BY MOUTH DAILY, Disp: 90 tablet, Rfl: 5 .  indomethacin (INDOCIN) 50 MG capsule, Take 1 capsule (50 mg total) by mouth 3 (three) times daily as needed (for gout flares)., Disp: 30 capsule, Rfl: 1 .  mupirocin ointment (BACTROBAN) 2 %, Apply 1 application topically 2 (two) times daily., Disp: 22 g, Rfl: 0 .  pantoprazole (PROTONIX) 40 MG tablet, TAKE 1 TABLET BY MOUTH EVERY DAY, Disp: 90 tablet, Rfl: 4 .  torsemide (DEMADEX) 20 MG tablet, TAKE 2 TABLETS(40  MG) BY MOUTH DAILY, Disp: 180 tablet, Rfl: 0 .  triamcinolone ointment (KENALOG) 0.1 %, Apply 1 application topically 2 (two) times daily as needed (itching)., Disp: 453.6 g, Rfl: 3 .  valsartan (DIOVAN) 160 MG tablet, TAKE 1 TABLET BY MOUTH DAILY( TAKE IN PLACE OF LOSARTAN), Disp: 90 tablet, Rfl: 0 .  colchicine 0.6 MG tablet, Take 2 pills on day 1. Then take 1 pill each of the following day until gout flare resolved. (Patient not taking: Reported on 03/31/2019), Disp: 30 tablet, Rfl: 0 .  Cyanocobalamin (VITAMIN B-12 CR) 1000 MCG TBCR, Take 1,000 mcg by mouth daily. , Disp: , Rfl:  .  losartan (COZAAR) 50 MG tablet, Take 2 tablets (100 mg  total) by mouth daily. (Patient not taking: Reported on 05/11/2019), Disp: 90 tablet, Rfl: 3  Review of Systems  Constitutional: Negative.   Respiratory: Negative.   Cardiovascular: Negative.   Gastrointestinal: Negative.   Neurological: Positive for light-headedness (Only one episode. ). Negative for dizziness and headaches.    Social History   Tobacco Use  . Smoking status: Former Smoker    Packs/day: 1.00    Types: Cigarettes    Quit date: 11/26/1999    Years since quitting: 19.4  . Smokeless tobacco: Never Used  Substance Use Topics  . Alcohol use: No      Objective:   BP 114/60 (BP Location: Right Arm, Patient Position: Sitting, Cuff Size: Normal)   Pulse (!) 52   Temp 98.3 F (36.8 C) (Oral)   Resp 16   Wt 213 lb (96.6 kg)   BMI 30.56 kg/m  Vitals:   05/11/19 0832  BP: 114/60  Pulse: (!) 52  Resp: 16  Temp: 98.3 F (36.8 C)  TempSrc: Oral  Weight: 213 lb (96.6 kg)     Physical Exam  General Appearance:    Alert, cooperative, no distress, obese  Eyes:    PERRL, conjunctiva/corneas clear, EOM's intact       Lungs:     Clear to auscultation bilaterally, respirations unlabored  Heart:    Regular rate and rhythm  Neurologic:   Awake, alert, oriented x 3. No apparent focal neurological           defect.           Assessment & Plan    1. Arteriosclerosis of coronary artery Asymptomatic. Compliant with medication.  Continue aggressive risk factor modification.   - Comprehensive metabolic panel - Lipid panel  2. Essential (primary) hypertension Well controlled.  Continue current medications.    3. Chronic kidney disease (CKD), stage III (moderate) (HCC) Due to check renal functions as above.   4. Hyperuricemia Doing well since increasing allopurinol after last visit.  - Uric acid  5. Prediabetes  - Hemoglobin A1c  6. Prostate cancer screening  - PSA      The entirety of the information documented in the History of Present Illness, Review  of Systems and Physical Exam were personally obtained by me. Portions of this information were initially documented by Ashley Royalty, CMA and reviewed by me for thoroughness and accuracy.   Lelon Huh, MD  [facility]

## 2019-05-11 NOTE — Patient Instructions (Signed)
.   Please review the attached list of medications and notify my office if there are any errors.   . Please bring all of your medications to every appointment so we can make sure that our medication list is the same as yours.   

## 2019-05-12 DIAGNOSIS — R7303 Prediabetes: Secondary | ICD-10-CM | POA: Diagnosis not present

## 2019-05-12 DIAGNOSIS — I2510 Atherosclerotic heart disease of native coronary artery without angina pectoris: Secondary | ICD-10-CM | POA: Diagnosis not present

## 2019-05-12 DIAGNOSIS — E790 Hyperuricemia without signs of inflammatory arthritis and tophaceous disease: Secondary | ICD-10-CM | POA: Diagnosis not present

## 2019-05-13 ENCOUNTER — Telehealth: Payer: Self-pay

## 2019-05-13 LAB — COMPREHENSIVE METABOLIC PANEL
ALT: 14 IU/L (ref 0–44)
AST: 18 IU/L (ref 0–40)
Albumin/Globulin Ratio: 1.3 (ref 1.2–2.2)
Albumin: 4.2 g/dL (ref 3.7–4.7)
Alkaline Phosphatase: 129 IU/L — ABNORMAL HIGH (ref 39–117)
BUN/Creatinine Ratio: 16 (ref 10–24)
BUN: 27 mg/dL (ref 8–27)
Bilirubin Total: 0.9 mg/dL (ref 0.0–1.2)
CO2: 29 mmol/L (ref 20–29)
Calcium: 8.6 mg/dL (ref 8.6–10.2)
Chloride: 102 mmol/L (ref 96–106)
Creatinine, Ser: 1.74 mg/dL — ABNORMAL HIGH (ref 0.76–1.27)
GFR calc Af Amer: 43 mL/min/{1.73_m2} — ABNORMAL LOW (ref >59)
GFR calc non Af Amer: 37 mL/min/{1.73_m2} — ABNORMAL LOW (ref >59)
Globulin, Total: 3.2 g/dL (ref 1.5–4.5)
Glucose: 123 mg/dL — ABNORMAL HIGH (ref 65–99)
Potassium: 3.7 mmol/L (ref 3.5–5.2)
Sodium: 145 mmol/L — ABNORMAL HIGH (ref 134–144)
Total Protein: 7.4 g/dL (ref 6.0–8.5)

## 2019-05-13 LAB — LIPID PANEL
Chol/HDL Ratio: 3.4 ratio (ref 0.0–5.0)
Cholesterol, Total: 110 mg/dL (ref 100–199)
HDL: 32 mg/dL — ABNORMAL LOW (ref >39)
LDL Calculated: 59 mg/dL (ref 0–99)
Triglycerides: 94 mg/dL (ref 0–149)
VLDL Cholesterol Cal: 19 mg/dL (ref 5–40)

## 2019-05-13 LAB — HEMOGLOBIN A1C
Est. average glucose Bld gHb Est-mCnc: 134 mg/dL
Hgb A1c MFr Bld: 6.3 % — ABNORMAL HIGH (ref 4.8–5.6)

## 2019-05-13 LAB — URIC ACID: Uric Acid: 3.9 mg/dL (ref 3.7–8.6)

## 2019-05-13 LAB — PSA: Prostate Specific Ag, Serum: 2.8 ng/mL (ref 0.0–4.0)

## 2019-05-13 NOTE — Telephone Encounter (Signed)
LMTCB 05/13/2019   Thanks,   -Darrian Grzelak  

## 2019-05-13 NOTE — Telephone Encounter (Signed)
Pt returned call  tp

## 2019-05-13 NOTE — Telephone Encounter (Signed)
LMTCB 05/13/2019   Thanks,   -Lashan Gluth  

## 2019-05-13 NOTE — Telephone Encounter (Signed)
-----   Message from Birdie Sons, MD sent at 05/13/2019  9:16 AM EDT ----- Kidney functions declined a little bit. Need to drink more water. Uric acid is much better shouldn't have any trouble with gout so long as he stays on same dose of allopurinol.  Cholesterol is good at 110. PSA is normal. a1c is stable at 6.3 Schedule follow up for a1c and meds in 6 months.

## 2019-05-18 ENCOUNTER — Telehealth: Payer: Self-pay | Admitting: Family Medicine

## 2019-05-18 NOTE — Chronic Care Management (AMB) (Signed)
Chronic Care Management   Note  05/18/2019 Name: Taylor [name] MRN: [id number] DOB: 05-13-1941  Taylor [name] is a 78 y.o. year old male who is a primary care patient of [name] Section, [name], MD. I reached out to Taylor [name] by phone today in response to a referral sent by Taylor [name] health plan.    [name] was given information about Chronic Care Management services today including:  1. CCM service includes personalized support from designated clinical staff supervised by his physician, including individualized plan of care and coordination with other care providers 2. 24/7 contact phone numbers for assistance for urgent and routine care needs. 3. Service will only be billed when office clinical staff spend 20 minutes or more in a month to coordinate care. 4. Only one practitioner may furnish and bill the service in a calendar month. 5. The patient may stop CCM services at any time (effective at the end of the month) by phone call to the office staff. 6. The patient will be responsible for cost sharing (co-pay) of up to 20% of the service fee (after annual deductible is met).  Patient agreed to services and verbal consent obtained.   Follow up plan: Telephone appointment with CCM team member scheduled for: 06/03/2019  [place]  [email or web address]   ??[phone]

## 2019-05-18 NOTE — Telephone Encounter (Signed)
Pt advised.   Thanks,   -Cope Marte  

## 2019-05-20 DIAGNOSIS — D100 Benign neoplasm of lip: Secondary | ICD-10-CM | POA: Diagnosis not present

## 2019-05-25 ENCOUNTER — Other Ambulatory Visit: Payer: Self-pay | Admitting: Family Medicine

## 2019-05-25 DIAGNOSIS — I509 Heart failure, unspecified: Secondary | ICD-10-CM

## 2019-06-03 ENCOUNTER — Telehealth: Payer: Self-pay

## 2019-06-03 ENCOUNTER — Telehealth: Payer: Medicare Other

## 2019-06-03 ENCOUNTER — Ambulatory Visit: Payer: Self-pay

## 2019-06-03 DIAGNOSIS — G4730 Sleep apnea, unspecified: Secondary | ICD-10-CM | POA: Diagnosis not present

## 2019-06-03 NOTE — Chronic Care Management (AMB) (Signed)
   Chronic Care Management   Unsuccessful Call Note 06/03/2019 Name: Taylor [name] MRN: [id number] DOB: 1941/05/18  Taylor [name]. [name] is a 78 year old male who sees Dr. Lelon Huh for primary care. Mr. [employer]x health plan asked the CCM team to consult the patient for chronic case management secondary to his multiple chronic conditions. He was consented to services and agreed to an initial telephone health assessment for today at 2:30.    Was unable to reach patient via telephone today during scheduled appointment time. I have left HIPAA compliant voicemail asking patient to return my call. (unsuccessful outreach #1).   Plan: Will follow-up within 7 business days via telephone.     Brynnlee Cumpian E. Rollene Rotunda, RN, BSN Nurse Care Coordinator [facility] Care Management [phone]

## 2019-06-03 NOTE — Telephone Encounter (Signed)
Patient called office stating that he was returning call back to Dr. Sabino Snipes nurse, please review. KW

## 2019-06-09 ENCOUNTER — Ambulatory Visit: Payer: Self-pay

## 2019-06-09 ENCOUNTER — Other Ambulatory Visit: Payer: Self-pay

## 2019-06-09 NOTE — Chronic Care Management (AMB) (Signed)
  Chronic Care Management   Note  06/09/2019 Name: Taylor [name] MRN: [id number] DOB: 16-Jan-1941  Care Coordination: Successful telephone encounter to Taylor [name], primary care patient of Dr. [name]. [name] Section, who was referred to the chronic care management team by his health plan. Taylor [name] was not available for his initial telephone health assessment when previously scheduled. He would like to reschedule at this time.  Plan: Initial assessment rescheduled for 06/23/2019 at 1:00  Jahad Old E. Rollene Rotunda, RN, BSN Nurse Care Coordinator [facility] Care Management [phone]

## 2019-06-23 ENCOUNTER — Ambulatory Visit: Payer: Self-pay

## 2019-06-23 ENCOUNTER — Telehealth: Payer: Self-pay

## 2019-06-23 NOTE — Chronic Care Management (AMB) (Signed)
          Chronic Care Management   Unsuccessful Call Note 06/03/2019 Name: Taylor [name]  MRN: [id number]       DOB: [birth year]  [name]. Garske is a 78 year old malewho sees Dr. Lelon Huh for primary care. Mr. Krumholz health planasked the CCM team to consult the patient for chronic case management secondary to his multiple chronic conditions. He was consented to services and agreed to an initial telephone health assessment for7/07/2019 at 2:30. He was unreachable during appointment time which was later rescheduled for today at 1:00  Was unable to reach patient via telephone today during scheduled appointment time. Ihave left HIPAA compliant message with patients wife asking patient to return my call. (unsuccessful outreach #2).  Plan: Will follow-up within 14business days via telephone.   Nicci Vaughan E. Rollene Rotunda, RN, BSN Nurse Care Coordinator [facility] Care Management [phone]

## 2019-06-23 NOTE — Chronic Care Management (AMB) (Signed)
  Chronic Care Management   Note  06/23/2019 Name: Taylor [name] MRN: [id number] DOB: 1941-05-12  Care Coordination: Incoming call from Taylor [name] in response to his misses initial assessment appointment with RN CM this morning. Taylor. Hippert wishes to reschedule appointment and apologetic for a second missed scheduled appointment.  Plan: Initial assessment telephone appointment rescheduled for third time 07/08/2019 at 9:30. If patient is not able to complete appointment as scheduled, he will not be rescheduled. Patient aware  Dulcemaria Bula E. Rollene Rotunda, RN, BSN Nurse Care Coordinator [facility] Care Management [phone]

## 2019-07-04 DIAGNOSIS — G4730 Sleep apnea, unspecified: Secondary | ICD-10-CM | POA: Diagnosis not present

## 2019-07-08 ENCOUNTER — Ambulatory Visit: Payer: Self-pay

## 2019-07-08 ENCOUNTER — Telehealth: Payer: Self-pay

## 2019-07-08 ENCOUNTER — Other Ambulatory Visit: Payer: Self-pay | Admitting: Family Medicine

## 2019-07-08 NOTE — Chronic Care Management (AMB) (Signed)
   Chronic Care Management   Unsuccessful Call Note 06/03/2019 Name:Taylor E OrrMRN: [id number] DOB: August 27, 1941  Taylor [name]. Orris a 78year old malewho seesDr. Terrilyn Saver primary care. Taylor [name] health planasked the CCM team to consult the patient forchronic case management secondary to his multiple chronic conditions. He was consented to services and agreed to an initial telephone health assessment for7/07/2019 at 2:30.He was unreachable during first 2 appointment times which was later rescheduled for today at 9:30  Was unable to reach patient via telephone todayduring scheduled appointment time. Ihave left HIPAA compliant message with patients wife asking patient to return my call. This is the third scheduled agreed upon appointment the Taylor [name] has not been available for. Appointment will not be rescheduled at this time if he returns call. CCM    Lizzie An E. Rollene Rotunda, RN, BSN Nurse Care Coordinator [facility] Care Management [phone]

## 2019-07-11 IMAGING — CR DG CHEST 2V
1 series · 2 of 2 positions shown · non-contrast
Comparison: 08/10/2018

CLINICAL DATA: Shortness of breath for 1 week. Crackles lung bases.
Systolic congestive heart failure.

EXAM:
CHEST - 2 VIEW

[Series 1: dg chest 2 view · 0.14mm/px · 2 of 2 slices shown]
[im 1/2]
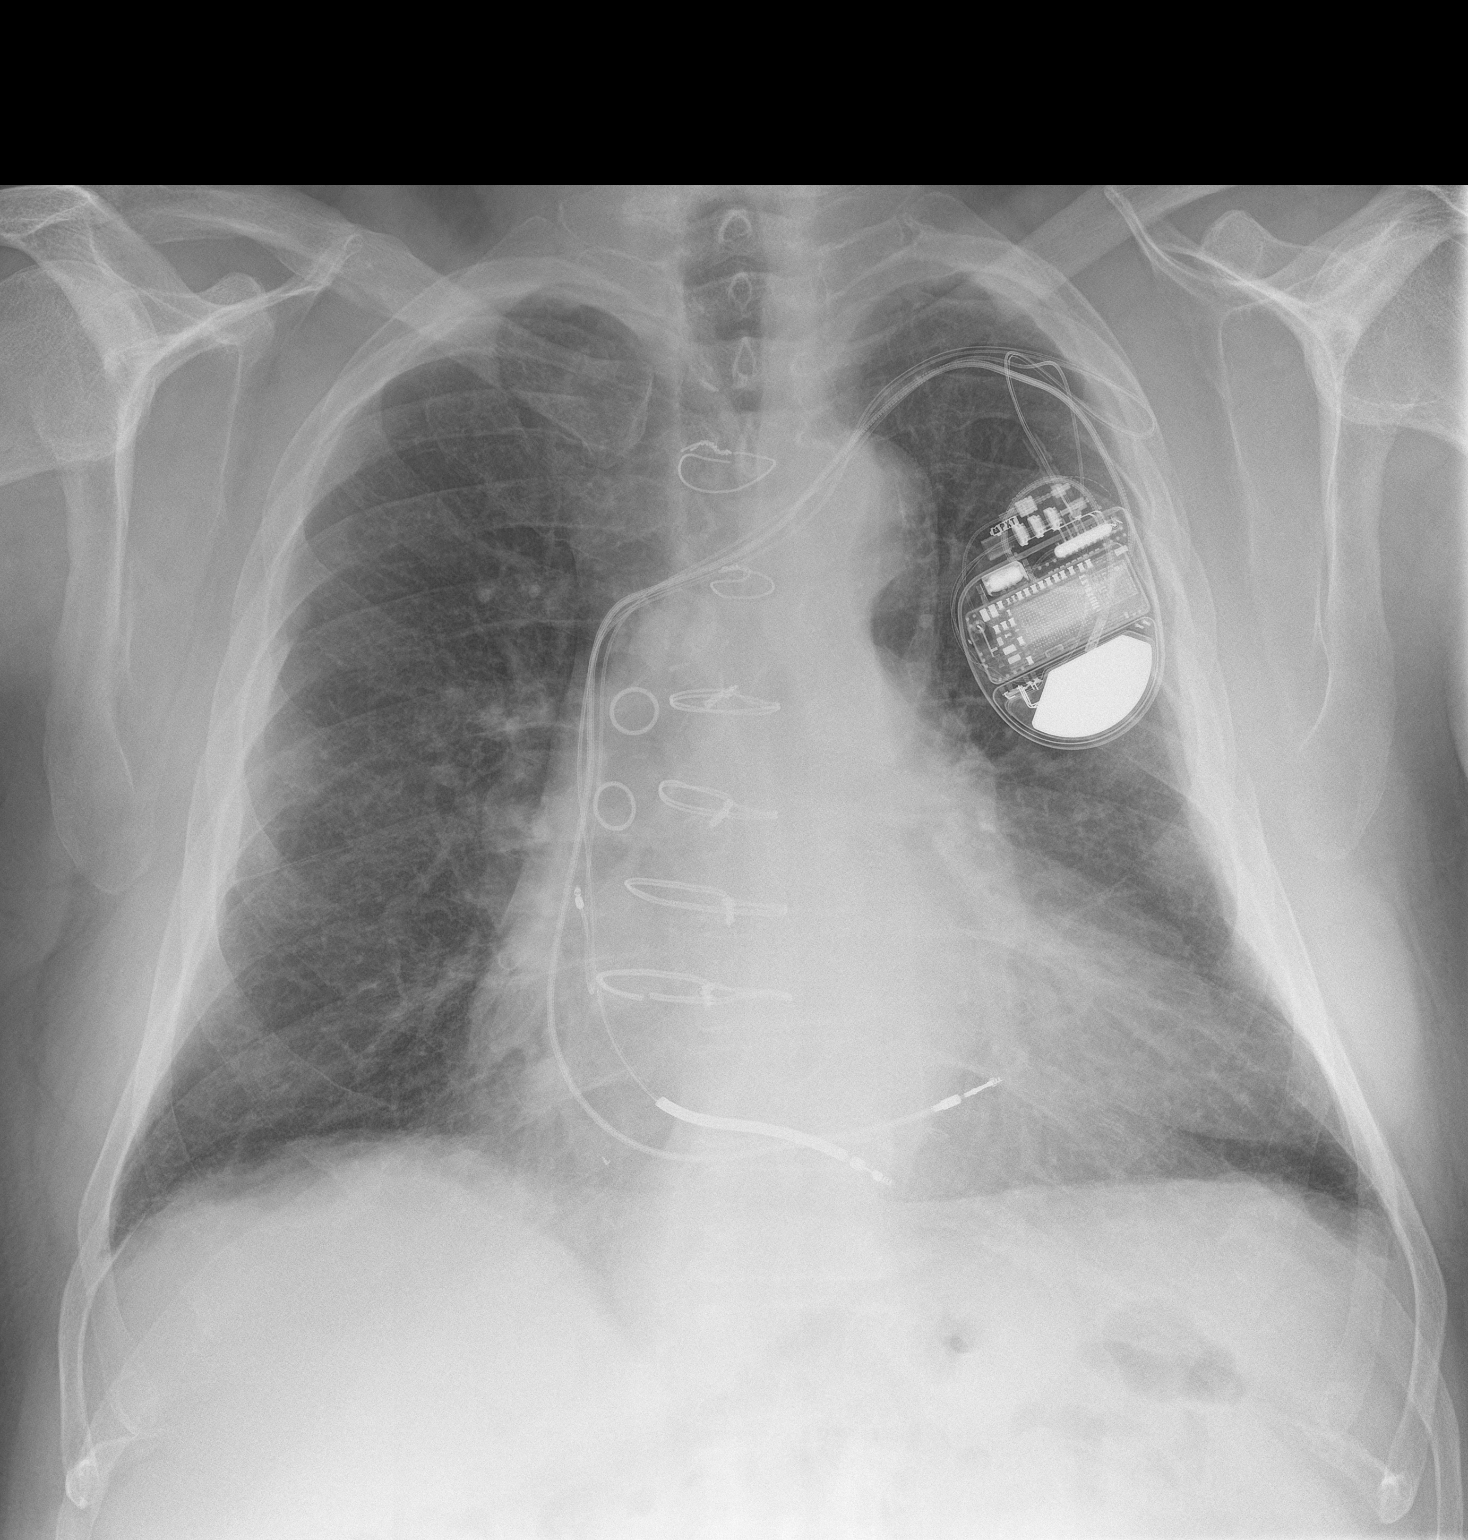
[im 2/2]
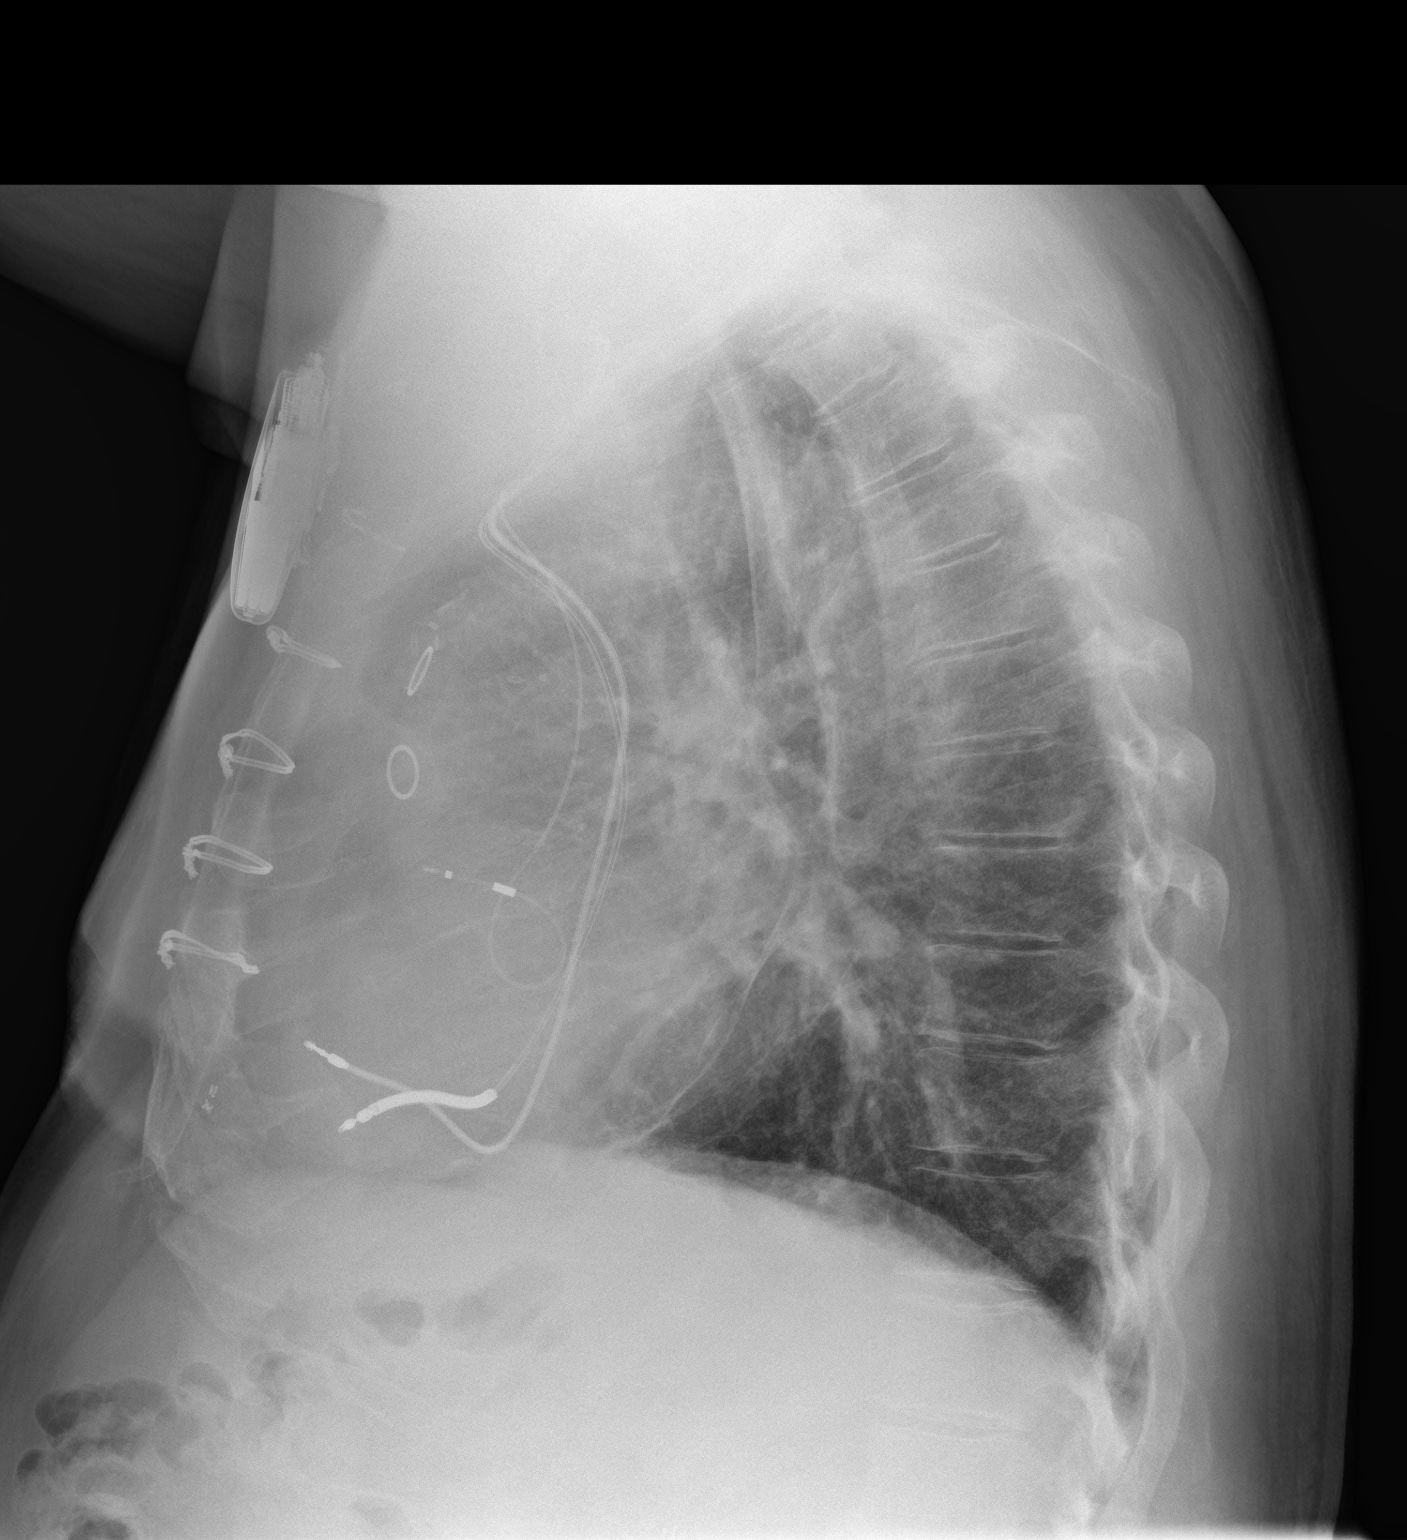

[2 of 2 positions shown; findings below may reference images not displayed]

FINDINGS: Stable moderate cardiomegaly. No evidence of pulmonary infiltrate or
pleural effusion. Pacemaker remains in place and prior CABG again
noted.
IMPRESSION: Stable cardiomegaly.  No active lung disease.

## 2019-07-19 DIAGNOSIS — I25810 Atherosclerosis of coronary artery bypass graft(s) without angina pectoris: Secondary | ICD-10-CM | POA: Diagnosis not present

## 2019-07-19 DIAGNOSIS — I10 Essential (primary) hypertension: Secondary | ICD-10-CM | POA: Diagnosis not present

## 2019-07-19 DIAGNOSIS — I4901 Ventricular fibrillation: Secondary | ICD-10-CM | POA: Diagnosis not present

## 2019-07-19 DIAGNOSIS — I5022 Chronic systolic (congestive) heart failure: Secondary | ICD-10-CM | POA: Diagnosis not present

## 2019-07-19 DIAGNOSIS — I255 Ischemic cardiomyopathy: Secondary | ICD-10-CM | POA: Diagnosis not present

## 2019-08-04 DIAGNOSIS — G4730 Sleep apnea, unspecified: Secondary | ICD-10-CM | POA: Diagnosis not present

## 2019-08-31 ENCOUNTER — Other Ambulatory Visit: Payer: Self-pay | Admitting: Family Medicine

## 2019-09-03 DIAGNOSIS — G4730 Sleep apnea, unspecified: Secondary | ICD-10-CM | POA: Diagnosis not present

## 2019-09-20 ENCOUNTER — Ambulatory Visit (INDEPENDENT_AMBULATORY_CARE_PROVIDER_SITE_OTHER): Payer: Medicare Other | Admitting: Family Medicine

## 2019-09-20 ENCOUNTER — Encounter: Payer: Self-pay | Admitting: Family Medicine

## 2019-09-20 VITALS — Temp 97.0°F

## 2019-09-20 DIAGNOSIS — R058 Other specified cough: Secondary | ICD-10-CM

## 2019-09-20 DIAGNOSIS — I2510 Atherosclerotic heart disease of native coronary artery without angina pectoris: Secondary | ICD-10-CM | POA: Diagnosis not present

## 2019-09-20 DIAGNOSIS — I5020 Unspecified systolic (congestive) heart failure: Secondary | ICD-10-CM | POA: Diagnosis not present

## 2019-09-20 DIAGNOSIS — R05 Cough: Secondary | ICD-10-CM

## 2019-09-20 MED ORDER — PSEUDOEPH-BROMPHEN-DM 30-2-10 MG/5ML PO SYRP: 5.0000 mL | ORAL_SOLUTION | Freq: Three times a day (TID) | ORAL | 0 refills | Status: DC | PRN

## 2019-09-20 NOTE — Progress Notes (Signed)
Taylor [name]  MRN: [id number] DOB: 08/06/1941 Virtual Visit via Telephone Note  I connected with Taylor [name] on 09/20/19 at  8:20 AM EDT by telephone and verified that I am speaking with the correct person using two identifiers.  Location: Patient: Home Provider: Office   I discussed the limitations, risks, security and privacy concerns of performing an evaluation and management service by telephone and the availability of in person appointments. I also discussed with the patient that there may be a patient responsible charge related to this service. The patient expressed understanding and agreed to proceed.  Subjective:  HPI   The patient is a 78 year old male who presents via phone visit.  He has complaints of congestion that is slightly yellow.  He denies fever or cough.  Patient Active Problem List   Diagnosis Date Noted  . Hyperuricemia 03/31/2019  . Numbness 05/07/2018  . Benign colonic polyp 03/24/2017  . Tibialis anterior tenosynovitis 12/21/2015  . Pleural nodule 07/27/2015  . Psoriatic arthritis ([place]) 05/12/2015  . Restrictive lung disease 05/12/2015  . Barrett esophagus 03/30/2015  . Ache in joint 03/30/2015  . Bradycardia 03/30/2015  . Arteriosclerosis of coronary artery 03/30/2015  . Cardiac defibrillator in place 03/30/2015  . Chronic kidney disease (CKD), stage III (moderate) 03/30/2015  . Cluster headache syndrome 03/30/2015  . Degeneration of lumbar or lumbosacral intervertebral disc 03/30/2015  . Gout 03/30/2015  . Personal history of malignant neoplasm of bladder 03/30/2015  . Hypercholesteremia 03/30/2015  . LBP (low back pain) 03/30/2015  . Personal history of malignant melanoma of skin 03/30/2015  . Muscle ache 03/30/2015  . Arthritis of knee, degenerative 03/30/2015  . Prediabetes 03/30/2015  . Psoriasis 03/30/2015  . Obstructive sleep apnea 03/30/2015  . Chronic venous insufficiency 03/30/2015  . History of abdominal aortic aneurysm (AAA)  06/24/2013  . History of CVA (cerebrovascular accident) 06/24/2013  . Cardiomyopathy, ischemic 06/24/2013  . History of AAA (abdominal aortic aneurysm) repair 06/24/2013  . Essential (primary) hypertension 08/17/2011  . Arrhythmia, sinus node 08/17/2011  . History of ventricular fibrillation 08/17/2011  . Systolic CHF with reduced left ventricular function, NYHA class 3 ([place]) 02/26/2010  . 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 Cardiac pacemaker in situ 10/10/2006    Past Medical History:  Diagnosis Date  . AAA (abdominal aortic aneurysm) [facility]) 06/03/2007   [facility]; Dr. Kellie Simmering  . AICD (automatic cardioverter/defibrillator) present   . Barrett's esophagus   . Bladder cancer ([place])   . Bradycardia   . CAD (coronary artery disease)   . CHF (congestive heart failure) ([place])   . Cluster headache   . DDD (degenerative disc disease), lumbar   . Dyspnea    WITH EXERTION  . Edema    LEFT ANKLE  . Fracture of skull base ([place]) 1997   due to fall  . GERD (gastroesophageal reflux disease)   . Gout   . History of bladder cancer 12/1995  . Hyperlipidemia   . Hypertension   . Malignant melanoma ([place]) 12/2012   right dorsal forearm excised  . Myocardial infarction ([place])    LAST 2014  . Osteoarthritis of knee   . Pacemaker 10/10/2006  . Pneumonia    2016  . Pre-diabetes   . Psoriasis   . Rib fracture 1997   due to fall  . Sleep apnea    CPAP  . Stroke ([place])   . Venous incompetence    Past Surgical History:  Procedure Laterality Date  . ABDOMINAL AORTIC ANEURYSM REPAIR  06/03/2007   [facility]; Dr. Kellie Simmering  . ANGIOPLASTY  1994   MI  . BLADDER TUMOR EXCISION  12/1995  . CATARACT EXTRACTION W/PHACO Left 10/22/2016   Procedure: CATARACT EXTRACTION PHACO AND INTRAOCULAR LENS PLACEMENT (IOC);  Surgeon: Birder Robson, MD;  Location: [facility] ORS;  Service: Ophthalmology;  Laterality: Left;  [place] 47.7 AP% 18.4 CDE  8.78 Fluid pack lot # [id number]  . CATARACT EXTRACTION W/PHACO Right 12/10/2016   Procedure: CATARACT EXTRACTION PHACO AND INTRAOCULAR LENS PLACEMENT (IOC);  Surgeon: Birder Robson, MD;  Location: [facility] ORS;  Service: Ophthalmology;  Laterality: Right;  [place] 00:39 AP% 23.3 CDE 9.13 Fluid pack lot # [id number]  . CORONARY ANGIOPLASTY     STENTS X 5  . CORONARY ARTERY BYPASS GRAFT  09/22/2006   four  . ELBOW BURSA SURGERY     DUE TO GOUT  . INSERT / REPLACE / REMOVE PACEMAKER    . MELANOMA EXCISION  12/2012   Right forearm   Family History  Problem Relation Age of Onset  . Cancer Mother        Melanoma skin cancer  . Heart attack Father 109  . Cancer Father        throat cancer  . Arthritis Brother    Social History   Socioeconomic History  . Marital status: Married    Spouse name: Not on file  . Number of children: 3  . Years of education: Not on file  . Highest education level: 12th grade  Occupational History  . 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 Financial resource strain: Not hard at all  . Food insecurity    Worry: Never true    Inability: Never true  . Transportation needs    Medical: No    Non-medical: No  Tobacco Use  . Smoking status: Former Smoker    Packs/day: 1.00    Types: Cigarettes    Quit date: 11/26/1999    Years since quitting: 19.8  . Smokeless tobacco: Never Used  Substance and Sexual Activity  . Alcohol use: No  . Drug use: No  . Sexual activity: Not on file  Lifestyle  . Physical activity    Days per week: 0 days    Minutes per session: 0 min  . Stress: Not at all  Relationships  . Social Herbalist on phone: Patient refused    Gets together: Patient refused    Attends religious service: Patient refused    Active member of club or organization: Patient refused    Attends meetings of clubs or organizations: Patient refused    Relationship status: Patient refused  . Intimate partner  violence    Fear of current or ex partner: Patient refused    Emotionally abused: Patient refused    Physically abused: Patient refused    Forced sexual activity: Patient refused  Other Topics Concern  . Not on file  Social History Narrative   Lives at home with his family.  Independent at baseline.    Outpatient Encounter Medications as of 09/20/2019  Medication Sig  . allopurinol (ZYLOPRIM) 300 MG tablet Take 2 tablets (600 mg total) by mouth daily.  Marland Kitchen aspirin 81 MG EC tablet Take 81 mg by mouth daily.   Marland Kitchen atorvastatin (LIPITOR) 40 MG tablet TAKE 1 TABLET BY MOUTH EVERY DAY  . carvedilol (COREG) 12.5 MG tablet  TAKE 1 TABLET BY MOUTH DAILY  . colchicine 0.6 MG tablet Take 2 pills on day 1. Then take 1 pill each of the following day until gout flare resolved. (Patient not taking: Reported on 03/31/2019)  . Cyanocobalamin (VITAMIN B-12 CR) 1000 MCG TBCR Take 1,000 mcg by mouth daily.   . indomethacin (INDOCIN) 50 MG capsule Take 1 capsule (50 mg total) by mouth 3 (three) times daily as needed (for gout flares).  . losartan (COZAAR) 50 MG tablet TAKE 2 TABLETS(100 MG) BY MOUTH DAILY  . mupirocin ointment (BACTROBAN) 2 % Apply 1 application topically 2 (two) times daily.  . pantoprazole (PROTONIX) 40 MG tablet TAKE 1 TABLET BY MOUTH EVERY DAY  . torsemide (DEMADEX) 20 MG tablet TAKE 2 TABLETS(40 MG) BY MOUTH DAILY  . triamcinolone ointment (KENALOG) 0.1 % Apply 1 application topically 2 (two) times daily as needed (itching).  . valsartan (DIOVAN) 160 MG tablet TAKE 1 TABLET BY MOUTH DAILY( TAKE IN PLACE OF LOSARTAN)   No facility-administered encounter medications on file as of 09/20/2019.     Allergies  Allergen Reactions  . Amlodipine Besylate Swelling    Had a reaction when taking with colcrys   . Crestor [Rosuvastatin]     Muscle cramps and pain  . Rocephin [Ceftriaxone]     unknown    Review of Systems  Constitutional: Negative for chills, diaphoresis, fever and  malaise/fatigue.  HENT: Positive for congestion. Negative for ear discharge, ear pain, sinus pain and sore throat.   Respiratory: Negative for cough and shortness of breath.   Cardiovascular: Negative for chest pain.  Gastrointestinal: Negative for abdominal pain and diarrhea.  Musculoskeletal: Negative for myalgias.  Neurological: Negative for dizziness and headaches.    Objective:  Temp (!) 97 F (36.1 C)   Physical Exam: some sounds of stuffy nose but no apparent respiratory distress during telephonic interview.  Assessment and Plan :  1. Cough productive of yellow sputum Has had a little yellow sputum production 2-3 times a day over the past 2-3 weeks. No "cough", wheeze, "PND", chest pain, sore throat, fever, wheeze, headache or earache. No lasting relief from Nyquil or Robitussin. Some "sniffles" but no sneeze or watery eyes. Will treat with Bromfed-DM prn cough or congestion. Maintain pandemic restrictions and monitor for COVID symptoms. Recheck prn. Suspect allergic cough with rhinitis and PND. - brompheniramine-pseudoephedrine-DM 30-2-10 MG/5ML syrup; Take 5 mLs by mouth 3 (three) times daily as needed.  Dispense: 120 mL; Refill: 0  2. Systolic congestive heart failure, unspecified HF chronicity ([place]) Followed by Dr. Nehemiah Massed (cardiologist) and no recent flare of swelling or shortness of breath. Continues to exercise at the gym daily. No significant drop in energy level. Uses Demadex 20 mg 2 tablets qd, carvedilol 12,5 mg qd and Losartan 50 mg 2 tablets qd. Denies chest pains or palpitations.  3. Arteriosclerosis of coronary artery History of 4 vessel CABG in 2007. Feels stable without chest discomfort/angina.  Followed by Dr. Nehemiah Massed (last appointment 07-19-19).  Follow Up Instructions:    I discussed the assessment and treatment plan with the patient. The patient was provided an opportunity to ask questions and all were answered. The patient agreed with the plan and  demonstrated an understanding of the instructions.   The patient was advised to call back or seek an in-person evaluation if the symptoms worsen or if the condition fails to improve as anticipated.  I provided 15 minutes of non-face-to-face time during this encounter.   Vernie Murders, PA

## 2019-09-30 ENCOUNTER — Other Ambulatory Visit: Payer: Self-pay | Admitting: Family Medicine

## 2019-09-30 DIAGNOSIS — R058 Other specified cough: Secondary | ICD-10-CM

## 2019-09-30 DIAGNOSIS — R05 Cough: Secondary | ICD-10-CM

## 2019-09-30 MED ORDER — PSEUDOEPH-BROMPHEN-DM 30-2-10 MG/5ML PO SYRP: 5.0000 mL | ORAL_SOLUTION | Freq: Three times a day (TID) | ORAL | 0 refills | Status: DC | PRN

## 2019-09-30 NOTE — Telephone Encounter (Signed)
Please review message from patient.

## 2019-09-30 NOTE — Telephone Encounter (Signed)
Pt stated that he has been taking brompheniramine-pseudoephedrine-DM 30-2-10 MG/5ML syrup as directed and has notice a little improvement. Pt would like to try a different cough syrup if possible. If not, he would like a refill for brompheniramine-pseudoephedrine-DM 30-2-10 MG/5ML syrup sent to [facility]. Please advise. Thanks TNP

## 2019-10-04 DIAGNOSIS — G4730 Sleep apnea, unspecified: Secondary | ICD-10-CM | POA: Diagnosis not present

## 2019-10-25 ENCOUNTER — Encounter: Payer: Self-pay | Admitting: Family Medicine

## 2019-10-25 ENCOUNTER — Ambulatory Visit (INDEPENDENT_AMBULATORY_CARE_PROVIDER_SITE_OTHER): Payer: Medicare Other | Admitting: Family Medicine

## 2019-10-25 DIAGNOSIS — G44009 Cluster headache syndrome, unspecified, not intractable: Secondary | ICD-10-CM | POA: Diagnosis not present

## 2019-10-25 DIAGNOSIS — J0110 Acute frontal sinusitis, unspecified: Secondary | ICD-10-CM

## 2019-10-25 DIAGNOSIS — R058 Other specified cough: Secondary | ICD-10-CM

## 2019-10-25 DIAGNOSIS — I129 Hypertensive chronic kidney disease with stage 1 through stage 4 chronic kidney disease, or unspecified chronic kidney disease: Secondary | ICD-10-CM | POA: Diagnosis not present

## 2019-10-25 DIAGNOSIS — N1832 Chronic kidney disease, stage 3b: Secondary | ICD-10-CM

## 2019-10-25 DIAGNOSIS — R05 Cough: Secondary | ICD-10-CM

## 2019-10-25 DIAGNOSIS — I10 Essential (primary) hypertension: Secondary | ICD-10-CM

## 2019-10-25 DIAGNOSIS — I255 Ischemic cardiomyopathy: Secondary | ICD-10-CM

## 2019-10-25 DIAGNOSIS — L4050 Arthropathic psoriasis, unspecified: Secondary | ICD-10-CM

## 2019-10-25 DIAGNOSIS — E7800 Pure hypercholesterolemia, unspecified: Secondary | ICD-10-CM

## 2019-10-25 MED ORDER — DOXYCYCLINE HYCLATE 100 MG PO TABS: 100.0000 mg | ORAL_TABLET | Freq: Two times a day (BID) | ORAL | 0 refills | Status: DC

## 2019-10-25 NOTE — Progress Notes (Signed)
Patient: Taylor [name] Male    DOB: 1941/10/21   78 y.o.   MRN: [id number] Visit Date: 10/25/2019  Today's Provider: Wilhemena Durie, MD   No chief complaint on file.  Subjective:    Virtual Visit via Video Note  I connected with Taylor [name] on 10/25/19 at 11:20 AM EST by a video enabled telemedicine application and verified that I am speaking with the correct person using two identifiers.  Location: Patient: Home Provider: Office    HPI Patient has had a cough for about 5 weeks now.  He was treated appropriately with the expectorants but now he is developed a productive cough of yellow sputum plus yellow to green postnasal drainage plus facial pain.  He states it hurts between his eyes.  I specifically asked him where it is painful and he states if he pushes on the area between his medial eyebrows it hurts.  No fevers and no known Covid exposure.  Again the cough is persistent.  He has a mildly hoarse voice on the phone call. Allergies  Allergen Reactions  . Amlodipine Besylate Swelling    Had a reaction when taking with colcrys   . Crestor [Rosuvastatin]     Muscle cramps and pain  . Rocephin [Ceftriaxone]     unknown     Current Outpatient Medications:  .  allopurinol (ZYLOPRIM) 300 MG tablet, Take 2 tablets (600 mg total) by mouth daily., Disp: 60 tablet, Rfl: 12 .  aspirin 81 MG EC tablet, Take 81 mg by mouth daily. , Disp: , Rfl:  .  atorvastatin (LIPITOR) 40 MG tablet, TAKE 1 TABLET BY MOUTH EVERY DAY, Disp: 90 tablet, Rfl: 4 .  Cyanocobalamin (VITAMIN B-12 CR) 1000 MCG TBCR, Take 1,000 mcg by mouth daily. , Disp: , Rfl:  .  losartan (COZAAR) 50 MG tablet, TAKE 2 TABLETS(100 MG) BY MOUTH DAILY (Patient taking differently: 25 mg. ), Disp: 90 tablet, Rfl: 4 .  mupirocin ointment (BACTROBAN) 2 %, Apply 1 application topically 2 (two) times daily., Disp: 22 g, Rfl: 0 .  pantoprazole (PROTONIX) 40 MG tablet, TAKE 1 TABLET BY MOUTH EVERY DAY, Disp: 90 tablet,  Rfl: 4 .  torsemide (DEMADEX) 20 MG tablet, TAKE 2 TABLETS(40 MG) BY MOUTH DAILY, Disp: 180 tablet, Rfl: 3 .  triamcinolone ointment (KENALOG) 0.1 %, Apply 1 application topically 2 (two) times daily as needed (itching)., Disp: 453.6 g, Rfl: 3 .  brompheniramine-pseudoephedrine-DM 30-2-10 MG/5ML syrup, Take 5 mLs by mouth 3 (three) times daily as needed. (Patient not taking: Reported on 10/25/2019), Disp: 120 mL, Rfl: 0 .  carvedilol (COREG) 12.5 MG tablet, TAKE 1 TABLET BY MOUTH DAILY (Patient not taking: Reported on 10/25/2019), Disp: 90 tablet, Rfl: 4 .  colchicine 0.6 MG tablet, Take 2 pills on day 1. Then take 1 pill each of the following day until gout flare resolved. (Patient not taking: Reported on 03/31/2019), Disp: 30 tablet, Rfl: 0 .  indomethacin (INDOCIN) 50 MG capsule, Take 1 capsule (50 mg total) by mouth 3 (three) times daily as needed (for gout flares). (Patient not taking: Reported on 09/20/2019), Disp: 30 capsule, Rfl: 1 .  valsartan (DIOVAN) 160 MG tablet, TAKE 1 TABLET BY MOUTH DAILY( TAKE IN PLACE OF LOSARTAN) (Patient not taking: Reported on 09/20/2019), Disp: 90 tablet, Rfl: 0  Review of Systems  Constitutional: Negative.   HENT: Positive for congestion, postnasal drip, sinus pressure and sinus pain.   Eyes: Negative.   Respiratory:  Positive for cough. Negative for shortness of breath.   Cardiovascular: Negative.   Endocrine: Negative.   Hematological: Negative.   Psychiatric/Behavioral: Negative.     Social History   Tobacco Use  . Smoking status: Former Smoker    Packs/day: 1.00    Types: Cigarettes    Quit date: 11/26/1999    Years since quitting: 19.9  . Smokeless tobacco: Never Used  Substance Use Topics  . Alcohol use: No      Objective:   There were no vitals taken for this visit. There were no vitals filed for this visit.There is no height or weight on file to calculate BMI.   Physical Exam   No results found for any visits on 10/25/19.      Assessment & Plan     1. Subacute frontal sinusitis Treat with doxycycline for 1 week.  Advised the patient if at that time he is not improved he will need to see Dr. [name] Section or be reassessed somehow. - doxycycline (VIBRA-TABS) 100 MG tablet; Take 1 tablet (100 mg total) by mouth 2 (two) times daily.  Dispense: 14 tablet; Refill: 0  2. Cardiomyopathy, ischemic Risk factors treated  3. Essential (primary) hypertension   4. Cluster headache, not intractable, unspecified chronicity pattern History of cluster headaches  5. Stage 3b chronic kidney disease   6. Hypercholesteremia   7. Cough productive of yellow sputum Obtain Covid test as this has not been done.  We will do this first thing in the morning. - Novel Coronavirus, NAA ([facility])  8. Psoriatic arthritis ([place])   I discussed the limitations of evaluation and management by telemedicine and the availability of in person appointments. The patient expressed understanding and agreed to proceed.   I discussed the assessment and treatment plan with the patient. The patient was provided an opportunity to ask questions and all were answered. The patient agreed with the plan and demonstrated an understanding of the instructions.   The patient was advised to call back or seek an in-person evaluation if the symptoms worsen or if the condition fails to improve as anticipated.  I provided 12 minutes of non-face-to-face time during this encounter.    Taylor [name] Mon, MD  [facility]

## 2019-10-26 ENCOUNTER — Other Ambulatory Visit: Payer: Self-pay

## 2019-10-26 DIAGNOSIS — Z20822 Contact with and (suspected) exposure to covid-19: Secondary | ICD-10-CM

## 2019-10-28 LAB — NOVEL CORONAVIRUS, NAA: SARS-CoV-2, NAA: NOT DETECTED

## 2019-11-01 DIAGNOSIS — I5022 Chronic systolic (congestive) heart failure: Secondary | ICD-10-CM | POA: Diagnosis not present

## 2019-11-01 DIAGNOSIS — I495 Sick sinus syndrome: Secondary | ICD-10-CM | POA: Diagnosis not present

## 2019-11-01 DIAGNOSIS — I10 Essential (primary) hypertension: Secondary | ICD-10-CM | POA: Diagnosis not present

## 2019-11-01 DIAGNOSIS — I25810 Atherosclerosis of coronary artery bypass graft(s) without angina pectoris: Secondary | ICD-10-CM | POA: Diagnosis not present

## 2019-11-01 DIAGNOSIS — I255 Ischemic cardiomyopathy: Secondary | ICD-10-CM | POA: Diagnosis not present

## 2019-11-02 DIAGNOSIS — I495 Sick sinus syndrome: Secondary | ICD-10-CM | POA: Diagnosis not present

## 2019-11-03 DIAGNOSIS — G4730 Sleep apnea, unspecified: Secondary | ICD-10-CM | POA: Diagnosis not present

## 2019-11-05 NOTE — Progress Notes (Signed)
Patient: Taylor [name] Male    DOB: [birth year]   78 y.o.   MRN: [id number] Visit Date: 11/05/2019  Today's Provider: Lelon Huh, MD   No chief complaint on file.  Subjective:    Virtual Visit via Video Note  I connected with Taylor [name] on 11/05/19 at  4:00 PM EST by a video enabled telemedicine application and verified that I am speaking with the correct person using two identifiers.  Location: Patient: home Provider: [facility]   I discussed the limitations of evaluation and management by telemedicine and the availability of in person appointments. The patient expressed understanding and agreed to proceed.  URI  This is a new problem. Episode onset: 3 days ago  The problem has been gradually worsening. There has been no fever. Associated symptoms include congestion, headaches, nausea, sinus pain and wheezing. Pertinent negatives include no coughing or ear pain. Associated symptoms comments: SOB, fatigue, postnasal drainage. Treatments tried: antibiotics and brompheniramine-pseudoephedrine-DM  The treatment provided mild relief.  Has had similar sx off and on for the last 6 weeks and improved with doxycycline, but never completely resolved. Has no flared back up, but mostly in sinuses now. Cough is not nearly as bad as it was before last round of doxycycline.   Allergies  Allergen Reactions  . Amlodipine Besylate Swelling    Had a reaction when taking with colcrys   . Crestor [Rosuvastatin]     Muscle cramps and pain  . Rocephin [Ceftriaxone]     unknown     Current Outpatient Medications:  .  allopurinol (ZYLOPRIM) 300 MG tablet, Take 2 tablets (600 mg total) by mouth daily., Disp: 60 tablet, Rfl: 12 .  aspirin 81 MG EC tablet, Take 81 mg by mouth daily. , Disp: , Rfl:  .  atorvastatin (LIPITOR) 40 MG tablet, TAKE 1 TABLET BY MOUTH EVERY DAY, Disp: 90 tablet, Rfl: 4 .  brompheniramine-pseudoephedrine-DM 30-2-10 MG/5ML syrup, Take 5 mLs by mouth 3 (three) times daily as  needed. (Patient not taking: Reported on 10/25/2019), Disp: 120 mL, Rfl: 0 .  carvedilol (COREG) 12.5 MG tablet, TAKE 1 TABLET BY MOUTH DAILY (Patient not taking: Reported on 10/25/2019), Disp: 90 tablet, Rfl: 4 .  colchicine 0.6 MG tablet, Take 2 pills on day 1. Then take 1 pill each of the following day until gout flare resolved. (Patient not taking: Reported on 03/31/2019), Disp: 30 tablet, Rfl: 0 .  Cyanocobalamin (VITAMIN B-12 CR) 1000 MCG TBCR, Take 1,000 mcg by mouth daily. , Disp: , Rfl:  .  doxycycline (VIBRA-TABS) 100 MG tablet, Take 1 tablet (100 mg total) by mouth 2 (two) times daily., Disp: 14 tablet, Rfl: 0 .  indomethacin (INDOCIN) 50 MG capsule, Take 1 capsule (50 mg total) by mouth 3 (three) times daily as needed (for gout flares). (Patient not taking: Reported on 09/20/2019), Disp: 30 capsule, Rfl: 1 .  losartan (COZAAR) 50 MG tablet, TAKE 2 TABLETS(100 MG) BY MOUTH DAILY (Patient taking differently: 25 mg. ), Disp: 90 tablet, Rfl: 4 .  mupirocin ointment (BACTROBAN) 2 %, Apply 1 application topically 2 (two) times daily., Disp: 22 g, Rfl: 0 .  pantoprazole (PROTONIX) 40 MG tablet, TAKE 1 TABLET BY MOUTH EVERY DAY, Disp: 90 tablet, Rfl: 4 .  torsemide (DEMADEX) 20 MG tablet, TAKE 2 TABLETS(40 MG) BY MOUTH DAILY, Disp: 180 tablet, Rfl: 3 .  triamcinolone ointment (KENALOG) 0.1 %, Apply 1 application topically 2 (two) times daily as needed (itching)., Disp:  453.6 g, Rfl: 3 .  valsartan (DIOVAN) 160 MG tablet, TAKE 1 TABLET BY MOUTH DAILY( TAKE IN PLACE OF LOSARTAN) (Patient not taking: Reported on 09/20/2019), Disp: 90 tablet, Rfl: 0   Review of Systems  Constitutional: Positive for fatigue.  HENT: Positive for congestion, postnasal drip and sinus pain. Negative for ear pain.   Respiratory: Positive for wheezing. Negative for cough.   Gastrointestinal: Positive for nausea.  Musculoskeletal: Negative.   Neurological: Positive for headaches.    Social History   Tobacco Use  .  Smoking status: Former Smoker    Packs/day: 1.00    Types: Cigarettes    Quit date: 11/26/1999    Years since quitting: 19.9  . Smokeless tobacco: Never Used  Substance Use Topics  . Alcohol use: No      Objective:   There were no vitals taken for this visit.   Physical Exam  General appearance: Obese male, cooperative and in no acute distress Head: Normocephalic, without obvious abnormality, atraumatic Respiratory: Respirations even and unlabored, normal respiratory rate Extremities: All extremities are intact.  Skin: Skin color, texture, turgor normal. No rashes seen  Psych: Appropriate mood and affect. Neurologic: Mental status: Alert, oriented to person, place, and time, thought content appropriate.  No results found for any visits on 11/08/19.     Assessment & Plan     1. Subacute frontal sinusitis  - doxycycline (VIBRA-TABS) 100 MG tablet; Take 1 tablet (100 mg total) by mouth 2 (two) times daily for 10 days.  Dispense: 20 tablet; Refill: 0 - fluticasone (FLONASE) 50 MCG/ACT nasal spray; Place 2 sprays into both nostrils daily.  Dispense: 16 g; Refill: 0  Call if symptoms change or if not rapidly improving.     I discussed the assessment and treatment plan with the patient. The patient was provided an opportunity to ask questions and all were answered. The patient agreed with the plan and demonstrated an understanding of the instructions.   The patient was advised to call back or seek an in-person evaluation if the symptoms worsen or if the condition fails to improve as anticipated.  I provided 11 minutes of non-face-to-face time during this encounter.     Lelon Huh, MD  [facility]

## 2019-11-08 ENCOUNTER — Ambulatory Visit (INDEPENDENT_AMBULATORY_CARE_PROVIDER_SITE_OTHER): Payer: Medicare Other | Admitting: Family Medicine

## 2019-11-08 ENCOUNTER — Other Ambulatory Visit: Payer: Self-pay | Admitting: Family Medicine

## 2019-11-08 ENCOUNTER — Other Ambulatory Visit: Payer: Self-pay

## 2019-11-08 DIAGNOSIS — J0110 Acute frontal sinusitis, unspecified: Secondary | ICD-10-CM | POA: Diagnosis not present

## 2019-11-08 MED ORDER — DOXYCYCLINE HYCLATE 100 MG PO TABS: 100.0000 mg | ORAL_TABLET | Freq: Two times a day (BID) | ORAL | 0 refills | Status: DC

## 2019-11-08 MED ORDER — FLUTICASONE PROPIONATE 50 MCG/ACT NA SUSP: 2.0000 | Freq: Every day | NASAL | 0 refills | Status: DC

## 2019-11-12 ENCOUNTER — Ambulatory Visit: Payer: Self-pay

## 2019-11-12 ENCOUNTER — Other Ambulatory Visit: Payer: Self-pay

## 2019-11-12 ENCOUNTER — Inpatient Hospital Stay
Admission: EM | Admit: 2019-11-12 | Discharge: 2019-11-14 | DRG: 193 | Disposition: A | Discharge status: Home / Self Care | Payer: Medicare Other | Attending: Family Medicine | Admitting: Family Medicine

## 2019-11-12 ENCOUNTER — Encounter: Payer: Self-pay | Admitting: *Deleted

## 2019-11-12 ENCOUNTER — Emergency Department: Payer: Medicare Other

## 2019-11-12 DIAGNOSIS — M109 Gout, unspecified: Secondary | ICD-10-CM | POA: Diagnosis not present

## 2019-11-12 DIAGNOSIS — Z8673 Personal history of transient ischemic attack (TIA), and cerebral infarction without residual deficits: Secondary | ICD-10-CM

## 2019-11-12 DIAGNOSIS — Z0184 Encounter for antibody response examination: Secondary | ICD-10-CM | POA: Diagnosis not present

## 2019-11-12 DIAGNOSIS — J8489 Other specified interstitial pulmonary diseases: Secondary | ICD-10-CM | POA: Diagnosis present

## 2019-11-12 DIAGNOSIS — Z95810 Presence of automatic (implantable) cardiac defibrillator: Secondary | ICD-10-CM

## 2019-11-12 DIAGNOSIS — I248 Other forms of acute ischemic heart disease: Secondary | ICD-10-CM | POA: Diagnosis present

## 2019-11-12 DIAGNOSIS — Z85820 Personal history of malignant melanoma of skin: Secondary | ICD-10-CM | POA: Diagnosis not present

## 2019-11-12 DIAGNOSIS — I252 Old myocardial infarction: Secondary | ICD-10-CM

## 2019-11-12 DIAGNOSIS — I214 Non-ST elevation (NSTEMI) myocardial infarction: Secondary | ICD-10-CM | POA: Diagnosis not present

## 2019-11-12 DIAGNOSIS — J189 Pneumonia, unspecified organism: Secondary | ICD-10-CM | POA: Diagnosis not present

## 2019-11-12 DIAGNOSIS — R7303 Prediabetes: Secondary | ICD-10-CM | POA: Diagnosis present

## 2019-11-12 DIAGNOSIS — I130 Hypertensive heart and chronic kidney disease with heart failure and stage 1 through stage 4 chronic kidney disease, or unspecified chronic kidney disease: Secondary | ICD-10-CM | POA: Diagnosis present

## 2019-11-12 DIAGNOSIS — Z808 Family history of malignant neoplasm of other organs or systems: Secondary | ICD-10-CM | POA: Diagnosis not present

## 2019-11-12 DIAGNOSIS — L409 Psoriasis, unspecified: Secondary | ICD-10-CM | POA: Diagnosis present

## 2019-11-12 DIAGNOSIS — N1832 Chronic kidney disease, stage 3b: Secondary | ICD-10-CM | POA: Diagnosis present

## 2019-11-12 DIAGNOSIS — I2510 Atherosclerotic heart disease of native coronary artery without angina pectoris: Secondary | ICD-10-CM | POA: Diagnosis not present

## 2019-11-12 DIAGNOSIS — E7800 Pure hypercholesterolemia, unspecified: Secondary | ICD-10-CM | POA: Diagnosis present

## 2019-11-12 DIAGNOSIS — G44029 Chronic cluster headache, not intractable: Secondary | ICD-10-CM | POA: Diagnosis present

## 2019-11-12 DIAGNOSIS — N179 Acute kidney failure, unspecified: Secondary | ICD-10-CM | POA: Diagnosis present

## 2019-11-12 DIAGNOSIS — E876 Hypokalemia: Secondary | ICD-10-CM | POA: Diagnosis present

## 2019-11-12 DIAGNOSIS — J9601 Acute respiratory failure with hypoxia: Secondary | ICD-10-CM | POA: Diagnosis not present

## 2019-11-12 DIAGNOSIS — J440 Chronic obstructive pulmonary disease with acute lower respiratory infection: Secondary | ICD-10-CM | POA: Diagnosis present

## 2019-11-12 DIAGNOSIS — M1710 Unilateral primary osteoarthritis, unspecified knee: Secondary | ICD-10-CM | POA: Diagnosis present

## 2019-11-12 DIAGNOSIS — R0902 Hypoxemia: Secondary | ICD-10-CM | POA: Diagnosis not present

## 2019-11-12 DIAGNOSIS — K219 Gastro-esophageal reflux disease without esophagitis: Secondary | ICD-10-CM | POA: Diagnosis not present

## 2019-11-12 DIAGNOSIS — D696 Thrombocytopenia, unspecified: Secondary | ICD-10-CM | POA: Diagnosis present

## 2019-11-12 DIAGNOSIS — I5022 Chronic systolic (congestive) heart failure: Secondary | ICD-10-CM | POA: Diagnosis not present

## 2019-11-12 DIAGNOSIS — M5136 Other intervertebral disc degeneration, lumbar region: Secondary | ICD-10-CM | POA: Diagnosis present

## 2019-11-12 DIAGNOSIS — J9621 Acute and chronic respiratory failure with hypoxia: Secondary | ICD-10-CM | POA: Diagnosis not present

## 2019-11-12 DIAGNOSIS — Z77090 Contact with and (suspected) exposure to asbestos: Secondary | ICD-10-CM | POA: Diagnosis present

## 2019-11-12 DIAGNOSIS — I517 Cardiomegaly: Secondary | ICD-10-CM | POA: Diagnosis not present

## 2019-11-12 DIAGNOSIS — N189 Chronic kidney disease, unspecified: Secondary | ICD-10-CM | POA: Diagnosis not present

## 2019-11-12 DIAGNOSIS — Z7982 Long term (current) use of aspirin: Secondary | ICD-10-CM

## 2019-11-12 DIAGNOSIS — R0602 Shortness of breath: Secondary | ICD-10-CM

## 2019-11-12 DIAGNOSIS — Z20828 Contact with and (suspected) exposure to other viral communicable diseases: Secondary | ICD-10-CM | POA: Diagnosis present

## 2019-11-12 DIAGNOSIS — E785 Hyperlipidemia, unspecified: Secondary | ICD-10-CM | POA: Diagnosis present

## 2019-11-12 DIAGNOSIS — Z8679 Personal history of other diseases of the circulatory system: Secondary | ICD-10-CM

## 2019-11-12 DIAGNOSIS — Z8551 Personal history of malignant neoplasm of bladder: Secondary | ICD-10-CM

## 2019-11-12 DIAGNOSIS — Z87891 Personal history of nicotine dependence: Secondary | ICD-10-CM

## 2019-11-12 DIAGNOSIS — Z955 Presence of coronary angioplasty implant and graft: Secondary | ICD-10-CM

## 2019-11-12 DIAGNOSIS — Z79899 Other long term (current) drug therapy: Secondary | ICD-10-CM

## 2019-11-12 DIAGNOSIS — I700 Atherosclerosis of aorta: Secondary | ICD-10-CM | POA: Diagnosis present

## 2019-11-12 DIAGNOSIS — G4733 Obstructive sleep apnea (adult) (pediatric): Secondary | ICD-10-CM | POA: Diagnosis present

## 2019-11-12 DIAGNOSIS — I714 Abdominal aortic aneurysm, without rupture: Secondary | ICD-10-CM | POA: Diagnosis present

## 2019-11-12 LAB — COMPREHENSIVE METABOLIC PANEL WITH GFR
ALT: 32 U/L (ref 0–44)
AST: 79 U/L — ABNORMAL HIGH (ref 15–41)
Albumin: 3.6 g/dL (ref 3.5–5.0)
Alkaline Phosphatase: 87 U/L (ref 38–126)
Anion gap: 13 (ref 5–15)
BUN: 39 mg/dL — ABNORMAL HIGH (ref 8–23)
CO2: 29 mmol/L (ref 22–32)
Calcium: 8.4 mg/dL — ABNORMAL LOW (ref 8.9–10.3)
Chloride: 96 mmol/L — ABNORMAL LOW (ref 98–111)
Creatinine, Ser: 1.87 mg/dL — ABNORMAL HIGH (ref 0.61–1.24)
GFR calc Af Amer: 39 mL/min — ABNORMAL LOW
GFR calc non Af Amer: 34 mL/min — ABNORMAL LOW
Glucose, Bld: 146 mg/dL — ABNORMAL HIGH (ref 70–99)
Potassium: 3.4 mmol/L — ABNORMAL LOW (ref 3.5–5.1)
Sodium: 138 mmol/L (ref 135–145)
Total Bilirubin: 1.7 mg/dL — ABNORMAL HIGH (ref 0.3–1.2)
Total Protein: 8 g/dL (ref 6.5–8.1)

## 2019-11-12 LAB — RESPIRATORY PANEL BY RT PCR (FLU A&B, COVID)
Influenza A by PCR: NEGATIVE
Influenza B by PCR: NEGATIVE
SARS Coronavirus 2 by RT PCR: NEGATIVE

## 2019-11-12 LAB — CBC
HCT: 47.5 % (ref 39.0–52.0)
Hemoglobin: 15.9 g/dL (ref 13.0–17.0)
MCH: 32.3 pg (ref 26.0–34.0)
MCHC: 33.5 g/dL (ref 30.0–36.0)
MCV: 96.5 fL (ref 80.0–100.0)
Platelets: 123 K/uL — ABNORMAL LOW (ref 150–400)
RBC: 4.92 MIL/uL (ref 4.22–5.81)
RDW: 15.4 % (ref 11.5–15.5)
WBC: 6.5 K/uL (ref 4.0–10.5)
nRBC: 0 % (ref 0.0–0.2)

## 2019-11-12 LAB — BRAIN NATRIURETIC PEPTIDE: B Natriuretic Peptide: 102 pg/mL — ABNORMAL HIGH (ref 0.0–100.0)

## 2019-11-12 LAB — PROTIME-INR
INR: 1 (ref 0.8–1.2)
Prothrombin Time: 13.5 s (ref 11.4–15.2)

## 2019-11-12 LAB — TROPONIN I (HIGH SENSITIVITY)
Troponin I (High Sensitivity): 206 ng/L
Troponin I (High Sensitivity): 193 ng/L (ref <18)

## 2019-11-12 MED ORDER — IOHEXOL 350 MG/ML SOLN
60.0000 mL | Freq: Once | INTRAVENOUS | Status: AC | PRN
  Administered 2019-11-12: 60 mL via INTRAVENOUS

## 2019-11-12 MED ORDER — METHYLPREDNISOLONE SODIUM SUCC 125 MG IJ SOLR
125.0000 mg | Freq: Once | INTRAMUSCULAR | Status: AC
  Administered 2019-11-12: 125 mg via INTRAVENOUS
  Filled 2019-11-12: qty 2

## 2019-11-12 MED ORDER — IPRATROPIUM-ALBUTEROL 0.5-2.5 (3) MG/3ML IN SOLN
3.0000 mL | Freq: Once | RESPIRATORY_TRACT | Status: AC
  Administered 2019-11-12: 3 mL via RESPIRATORY_TRACT
  Filled 2019-11-12: qty 3

## 2019-11-12 MED ORDER — SODIUM CHLORIDE 0.9% FLUSH: 3.0000 mL | Freq: Once | INTRAVENOUS | Status: DC

## 2019-11-12 NOTE — ED Notes (Signed)
ED Provider at bedside. 

## 2019-11-12 NOTE — ED Provider Notes (Signed)
-----------------------------------------   11:52 PM on 11/12/2019 -----------------------------------------  Assumed care from Dr. Jimmye Norman.  Patient ambulated with pulse ox monitor and room air saturations decreased to 83%.  Patient tachypneic on walking.  Will add blood cultures, lactic acid, procalcitonin given appearance of pneumonia on CT scan.  Will discuss with hospitalist services to evaluate patient in the emergency department for admission.  CT chest interpreted per Dr. Joelyn Oms:  1. No pulmonary embolus.  2. Patchy ground-glass opacities throughout the right greater than  left lung. Findings favor infection, including atypical viral  organisms ( COVID-19). Other infectious or non infectious etiologies  are also considered, distribution is not typical of pulmonary edema.  3. Right hilar and mild mediastinal adenopathy. This is likely  reactive, however nonspecific. Consider follow-up CT after  resolution of symptoms.  4. Diffuse bilateral pleural nodularity which has progressively  calcified since 2016. Findings most consistent with prior asbestos  exposure.  5. Cardiomegaly. Trace contrast refluxes into the IVC suggesting  elevated right heart pressures.  6. Aortic Atherosclerosis (ICD10-I70.0).        Paulette Blanch, MD 11/13/19 [phone]

## 2019-11-12 NOTE — ED Triage Notes (Signed)
Pt states hx of ShoB x 7 weeks. Pt has seen his PCP via virtual visits. Pt states he cannot see PCP in person until January and is presenting w/ dyspnea that he cannot tolerate. Pt is visibly short of breath during triage while seated and profoundly short of breath w/ ambulation. Pt is grunting and tripoding.

## 2019-11-12 NOTE — ED Notes (Signed)
Ambulated patient per MD request. Patient's oxygen saturation decreased to 83% on RA from 96% while sitting upright. Patient's respiratory rate increased to 36 from 24. Patient heart rate remained stable in the 70 bpm range. MD informed.

## 2019-11-12 NOTE — Telephone Encounter (Signed)
Incoming call from Patient with complaint of SOB.  Can hear Patient in distress for breathing.  Patient reports this has been going on for several (7) weeks.  States the patter is constant.  Rates it moderate to severe.  Has a Hx. Of 4 heart attacks.  Reports a cough Reviewed protocol with Patient. Recommendation of going to ED or Urgent Care. Patient voices Understanding. States his wife step Out will be right back .  Recommend that he contact EMS.  Patient states he will wait for wife.         Reason for Disposition . [1] MODERATE difficulty breathing (e.g., speaks in phrases, SOB even at rest, pulse 100-120) AND [2] NEW-onset or WORSE than normal  Answer Assessment - Initial Assessment Questions 1. RESPIRATORY STATUS: "Describe your breathing?" (e.g., wheezing, shortness of breath, unable to speak, severe coughing)   SOB 2. ONSET: "When did this breathing problem begin?"       7 weeks ago 3. PATTERN "Does the difficult breathing come and go, or has it been constant since it started?"      constant 4. SEVERITY: "How bad is your breathing?" (e.g., mild, moderate, severe)    - MILD: No SOB at rest, mild SOB with walking, speaks normally in sentences, can lay down, no retractions, pulse < 100.    - MODERATE: SOB at rest, SOB with minimal exertion and prefers to sit, cannot lie down flat, speaks in phrases, mild retractions, audible wheezing, pulse 100-120.    - SEVERE: Very SOB at rest, speaks in single words, struggling to breathe, sitting hunched forward, retractions, pulse > 120     Moderate to severe 5. RECURRENT SYMPTOM: "Have you had difficulty breathing before?" If so, ask: "When was the last time?" and "What happened that time?"      6. CARDIAC HISTORY: "Do you have any history of heart disease?" (e.g., heart attack, angina, bypass surgery, angioplasty)     Yes  7. LUNG HISTORY: "Do you have any history of lung disease?"  (e.g., pulmonary embolus, asthma, emphysema)     denies 8.  CAUSE: "What do you think is causing the breathing problem?"      *No Answer* 9. OTHER SYMPTOMS: "Do you have any other symptoms? (e.g., dizziness, runny nose, cough, chest pain, fever)     Cough   10. PREGNANCY: "Is there any chance you are pregnant?" "When was your last menstrual period?"       na 11. TRAVEL: "Have you traveled out of the country in the last month?" (e.g., travel history, exposures)       *No Answer*  Protocols used: BREATHING DIFFICULTY-A-AH

## 2019-11-12 NOTE — ED Notes (Signed)
Patient reports SOB and cough X 7 weeks. Patient reports no increase in severity of symptoms. Patient reports that he had an appointment scheduled with his Primary MD for Monday, and his MD rescheduled it for January. Patient did not feel his symptoms could wait until January. 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

## 2019-11-12 NOTE — ED Provider Notes (Signed)
[facility] Emergency Department Provider Note       Time seen: ----------------------------------------- 8:19 PM on 11/12/2019 -----------------------------------------   I have reviewed the triage vital signs and the nursing notes.  HISTORY   Chief Complaint Shortness of Breath   HPI YITZCHAK KOTHARI is a 78 y.o. male with a history of AAA, AICD, coronary artery disease, CHF, degenerative disc disease, GERD, gout, who presents to the ED for shortness of breath for the past 7 weeks.  Patient has been seen by his primary care doctor virtually and has been on 3 rounds of doxycycline.  Patient states that shortness of breath and he cannot tolerate any activity.  Past Medical History:  Diagnosis Date  . AAA (abdominal aortic aneurysm) [facility]) 06/03/2007   [facility]; Dr. Kellie Simmering  . AICD (automatic cardioverter/defibrillator) present   . Barrett's esophagus   . Bladder cancer ([place])   . Bradycardia   . CAD (coronary artery disease)   . CHF (congestive heart failure) ([place])   . Cluster headache   . DDD (degenerative disc disease), lumbar   . Dyspnea    WITH EXERTION  . Edema    LEFT ANKLE  . Fracture of skull base ([place]) 1997   due to fall  . GERD (gastroesophageal reflux disease)   . Gout   . History of bladder cancer 12/1995  . Hyperlipidemia   . Hypertension   . Malignant melanoma ([place]) 12/2012   right dorsal forearm excised  . Myocardial infarction ([place])    LAST 2014  . Osteoarthritis of knee   . Pacemaker 10/10/2006  . Pneumonia    2016  . Pre-diabetes   . Psoriasis   . Rib fracture 1997   due to fall  . Sleep apnea    CPAP  . Stroke ([place])   . Venous incompetence     Patient Active Problem List   Diagnosis Date Noted  . Hyperuricemia 03/31/2019  . Numbness 05/07/2018  . Benign colonic polyp 03/24/2017  . Tibialis anterior tenosynovitis 12/21/2015  . Pleural nodule 07/27/2015  . Psoriatic arthritis ([place]) 05/12/2015   . Restrictive lung disease 05/12/2015  . Barrett esophagus 03/30/2015  . Ache in joint 03/30/2015  . Bradycardia 03/30/2015  . Arteriosclerosis of coronary artery 03/30/2015  . Cardiac defibrillator in place 03/30/2015  . Chronic kidney disease (CKD), stage III (moderate) 03/30/2015  . Cluster headache syndrome 03/30/2015  . Degeneration of lumbar or lumbosacral intervertebral disc 03/30/2015  . Gout 03/30/2015  . Personal history of malignant neoplasm of bladder 03/30/2015  . Hypercholesteremia 03/30/2015  . LBP (low back pain) 03/30/2015  . Personal history of malignant melanoma of skin 03/30/2015  . Muscle ache 03/30/2015  . Arthritis of knee, degenerative 03/30/2015  . Prediabetes 03/30/2015  . Psoriasis 03/30/2015  . Obstructive sleep apnea 03/30/2015  . Chronic venous insufficiency 03/30/2015  . History of abdominal aortic aneurysm (AAA) 06/24/2013  . History of CVA (cerebrovascular accident) 06/24/2013  . Cardiomyopathy, ischemic 06/24/2013  . History of AAA (abdominal aortic aneurysm) repair 06/24/2013  . Essential (primary) hypertension 08/17/2011  . Arrhythmia, sinus node 08/17/2011  . History of ventricular fibrillation 08/17/2011  . Systolic CHF with reduced left ventricular function, NYHA class 3 ([place]) 02/26/2010  . 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 Cardiac pacemaker in situ 10/10/2006    Past Surgical History:  Procedure Laterality Date  . ABDOMINAL AORTIC ANEURYSM REPAIR  06/03/2007   [facility]; Dr. Kellie Simmering  .  ANGIOPLASTY  1994   MI  . BLADDER TUMOR EXCISION  12/1995  . CATARACT EXTRACTION W/PHACO Left 10/22/2016   Procedure: CATARACT EXTRACTION PHACO AND INTRAOCULAR LENS PLACEMENT (IOC);  Surgeon: Birder Robson, MD;  Location: [facility] ORS;  Service: Ophthalmology;  Laterality: Left;  [place] 47.7 AP% 18.4 CDE 8.78 Fluid pack lot # [id number]  . CATARACT EXTRACTION W/PHACO Right 12/10/2016   Procedure: CATARACT  EXTRACTION PHACO AND INTRAOCULAR LENS PLACEMENT (IOC);  Surgeon: Birder Robson, MD;  Location: [facility] ORS;  Service: Ophthalmology;  Laterality: Right;  [place] 00:39 AP% 23.3 CDE 9.13 Fluid pack lot # [id number]  . CORONARY ANGIOPLASTY     STENTS X 5  . CORONARY ARTERY BYPASS GRAFT  09/22/2006   four  . ELBOW BURSA SURGERY     DUE TO GOUT  . INSERT / REPLACE / REMOVE PACEMAKER    . MELANOMA EXCISION  12/2012   Right forearm    Allergies Amlodipine besylate, Crestor [rosuvastatin], and Rocephin [ceftriaxone]  Social History Social History   Tobacco Use  . Smoking status: Former Smoker    Packs/day: 1.00    Types: Cigarettes    Quit date: 11/26/1999    Years since quitting: 19.9  . Smokeless tobacco: Never Used  Substance Use Topics  . Alcohol use: No  . Drug use: No   Review of Systems Constitutional: Negative for fever. Cardiovascular: Negative for chest pain. Respiratory: Positive for shortness of breath Gastrointestinal: Negative for abdominal pain, vomiting and diarrhea. Musculoskeletal: Negative for back pain. Skin: Negative for rash. Neurological: Negative for headaches, focal weakness or numbness.  All systems negative/normal/unremarkable except as stated in the HPI  ____________________________________________   PHYSICAL EXAM:  VITAL SIGNS: ED Triage Vitals  Enc Vitals Group     BP 11/12/19 1917 (!) 148/84     Pulse Rate 11/12/19 1917 62     Resp 11/12/19 1917 (!) 28     Temp 11/12/19 1917 98.9 F (37.2 C)     Temp Source 11/12/19 1917 Oral     SpO2 11/12/19 1917 93 %     Weight 11/12/19 1918 209 lb 12.8 oz (95.2 kg)     Height 11/12/19 1918 5\' 10"  (1.778 m)     Head Circumference --      Peak Flow --      Pain Score 11/12/19 1917 0     Pain Loc --      Pain Edu? --      Excl. in [place]? --    Constitutional: Alert and oriented.  Mild distress Eyes: Conjunctivae are normal. Normal extraocular movements. ENT      Head: Normocephalic and atraumatic.       Nose: No congestion/rhinnorhea.      Mouth/Throat: Mucous membranes are moist.      Neck: No stridor. Cardiovascular: Normal rate, regular rhythm. No murmurs, rubs, or gallops. Respiratory: Mild tachypnea with wheezing and scattered rales, worse on the right Gastrointestinal: Soft and nontender. Normal bowel sounds Musculoskeletal: Nontender with normal range of motion in extremities. No lower extremity tenderness nor edema. Neurologic:  Normal speech and language. No gross focal neurologic deficits are appreciated.  Skin:  Skin is warm, dry and intact. No rash noted. Psychiatric: Mood and affect are normal. Speech and behavior are normal.  ____________________________________________  EKG: Interpreted by me.  Atrial paced rhythm with prolonged AV conduction, rate of 62 bpm  ____________________________________________  ED COURSE:  As part of my medical decision making, I reviewed the following data within the  electronic MEDICAL RECORD NUMBER History obtained from family if available, nursing notes, old chart and ekg, as well as notes from prior ED visits. Patient presented for shortness of breath, we will assess with labs and imaging as indicated at this time.   Procedures  SHADEED COLBERG was evaluated in Emergency Department on 11/12/2019 for the symptoms described in the history of present illness. He was evaluated in the context of the global COVID-19 pandemic, which necessitated consideration that the patient might be at risk for infection with the SARS-CoV-2 virus that causes COVID-19. Institutional protocols and algorithms that pertain to the evaluation of patients at risk for COVID-19 are in a state of rapid change based on information released by regulatory bodies including the CDC and federal and state organizations. These policies and algorithms were followed during the patient's care in the ED.  ____________________________________________   LABS (pertinent  positives/negatives)  Labs Reviewed  CBC - Abnormal; Notable for the following components:      Result Value   Platelets 123 (*)    All other components within normal limits  COMPREHENSIVE METABOLIC PANEL - Abnormal; Notable for the following components:   Potassium 3.4 (*)    Chloride 96 (*)    Glucose, Bld 146 (*)    BUN 39 (*)    Creatinine, Ser 1.87 (*)    Calcium 8.4 (*)    AST 79 (*)    Total Bilirubin 1.7 (*)    GFR calc non Af Amer 34 (*)    GFR calc Af Amer 39 (*)    All other components within normal limits  BRAIN NATRIURETIC PEPTIDE - Abnormal; Notable for the following components:   B Natriuretic Peptide 102.0 (*)    All other components within normal limits  TROPONIN I (HIGH SENSITIVITY) - Abnormal; Notable for the following components:   Troponin I (High Sensitivity) 206 (*)    All other components within normal limits  TROPONIN I (HIGH SENSITIVITY) - Abnormal; Notable for the following components:   Troponin I (High Sensitivity) 193 (*)    All other components within normal limits  RESPIRATORY PANEL BY RT PCR (FLU A&B, COVID)  PROTIME-INR    RADIOLOGY Images were viewed by me  Chest x-ray IMPRESSION: 1. New multifocal airspace opacities bilaterally, greatest in the right mid lung zone. Findings are suspicious for multifocal pneumonia or developing pulmonary edema. 2. Probable small bilateral pleural effusions. 3. Stable cardiomegaly.  ____________________________________________   DIFFERENTIAL DIAGNOSIS   CHF, COPD, pneumonia, COVID-19, PE, pneumothorax  FINAL ASSESSMENT AND PLAN  Dyspnea   Plan: The patient had presented for progressive dyspnea. Patient's labs revealed some chronic abnormalities but no seemingly acute issue.  He has a chronically elevated troponin and chronic kidney disease. Patient's imaging on x-ray revealed multifocal airspace opacities.  CT angiogram has been ordered.   Laurence Aly, MD    Note: This note was  generated in part or whole with voice recognition software. Voice recognition is usually quite accurate but there are transcription errors that can and very often do occur. I apologize for any typographical errors that were not detected and corrected.     Earleen Newport, MD 11/12/19 2312

## 2019-11-13 ENCOUNTER — Inpatient Hospital Stay
Admit: 2019-11-13 | Discharge: 2019-11-13 | Disposition: A | Discharge status: Home / Self Care | Payer: Medicare Other | Attending: Family Medicine | Admitting: Family Medicine

## 2019-11-13 DIAGNOSIS — J189 Pneumonia, unspecified organism: Secondary | ICD-10-CM | POA: Diagnosis present

## 2019-11-13 DIAGNOSIS — L409 Psoriasis, unspecified: Secondary | ICD-10-CM | POA: Diagnosis present

## 2019-11-13 DIAGNOSIS — Z0184 Encounter for antibody response examination: Secondary | ICD-10-CM | POA: Diagnosis not present

## 2019-11-13 DIAGNOSIS — I2510 Atherosclerotic heart disease of native coronary artery without angina pectoris: Secondary | ICD-10-CM | POA: Diagnosis present

## 2019-11-13 DIAGNOSIS — K219 Gastro-esophageal reflux disease without esophagitis: Secondary | ICD-10-CM | POA: Diagnosis present

## 2019-11-13 DIAGNOSIS — M109 Gout, unspecified: Secondary | ICD-10-CM | POA: Diagnosis present

## 2019-11-13 DIAGNOSIS — J9601 Acute respiratory failure with hypoxia: Secondary | ICD-10-CM

## 2019-11-13 DIAGNOSIS — Z85820 Personal history of malignant melanoma of skin: Secondary | ICD-10-CM | POA: Diagnosis not present

## 2019-11-13 DIAGNOSIS — Z20828 Contact with and (suspected) exposure to other viral communicable diseases: Secondary | ICD-10-CM | POA: Diagnosis present

## 2019-11-13 DIAGNOSIS — I130 Hypertensive heart and chronic kidney disease with heart failure and stage 1 through stage 4 chronic kidney disease, or unspecified chronic kidney disease: Secondary | ICD-10-CM | POA: Diagnosis present

## 2019-11-13 DIAGNOSIS — N189 Chronic kidney disease, unspecified: Secondary | ICD-10-CM

## 2019-11-13 DIAGNOSIS — J440 Chronic obstructive pulmonary disease with acute lower respiratory infection: Secondary | ICD-10-CM | POA: Diagnosis present

## 2019-11-13 DIAGNOSIS — I252 Old myocardial infarction: Secondary | ICD-10-CM | POA: Diagnosis not present

## 2019-11-13 DIAGNOSIS — Z95810 Presence of automatic (implantable) cardiac defibrillator: Secondary | ICD-10-CM | POA: Diagnosis not present

## 2019-11-13 DIAGNOSIS — N179 Acute kidney failure, unspecified: Secondary | ICD-10-CM

## 2019-11-13 DIAGNOSIS — R0602 Shortness of breath: Secondary | ICD-10-CM | POA: Diagnosis present

## 2019-11-13 DIAGNOSIS — R7303 Prediabetes: Secondary | ICD-10-CM | POA: Diagnosis present

## 2019-11-13 DIAGNOSIS — Z8679 Personal history of other diseases of the circulatory system: Secondary | ICD-10-CM | POA: Diagnosis not present

## 2019-11-13 DIAGNOSIS — Z8551 Personal history of malignant neoplasm of bladder: Secondary | ICD-10-CM | POA: Diagnosis not present

## 2019-11-13 DIAGNOSIS — N1832 Chronic kidney disease, stage 3b: Secondary | ICD-10-CM | POA: Diagnosis present

## 2019-11-13 DIAGNOSIS — I248 Other forms of acute ischemic heart disease: Secondary | ICD-10-CM | POA: Diagnosis present

## 2019-11-13 DIAGNOSIS — E876 Hypokalemia: Secondary | ICD-10-CM

## 2019-11-13 DIAGNOSIS — G44029 Chronic cluster headache, not intractable: Secondary | ICD-10-CM | POA: Diagnosis present

## 2019-11-13 DIAGNOSIS — I5022 Chronic systolic (congestive) heart failure: Secondary | ICD-10-CM | POA: Diagnosis present

## 2019-11-13 DIAGNOSIS — J9621 Acute and chronic respiratory failure with hypoxia: Secondary | ICD-10-CM | POA: Diagnosis present

## 2019-11-13 DIAGNOSIS — E785 Hyperlipidemia, unspecified: Secondary | ICD-10-CM | POA: Diagnosis present

## 2019-11-13 DIAGNOSIS — M5136 Other intervertebral disc degeneration, lumbar region: Secondary | ICD-10-CM | POA: Diagnosis present

## 2019-11-13 DIAGNOSIS — J8489 Other specified interstitial pulmonary diseases: Secondary | ICD-10-CM | POA: Diagnosis not present

## 2019-11-13 DIAGNOSIS — Z808 Family history of malignant neoplasm of other organs or systems: Secondary | ICD-10-CM | POA: Diagnosis not present

## 2019-11-13 HISTORY — DX: Pneumonia, unspecified organism: J18.9

## 2019-11-13 LAB — BASIC METABOLIC PANEL
Anion gap: 13 (ref 5–15)
BUN: 42 mg/dL — ABNORMAL HIGH (ref 8–23)
CO2: 25 mmol/L (ref 22–32)
Calcium: 8.2 mg/dL — ABNORMAL LOW (ref 8.9–10.3)
Chloride: 99 mmol/L (ref 98–111)
Creatinine, Ser: 1.66 mg/dL — ABNORMAL HIGH (ref 0.61–1.24)
GFR calc Af Amer: 45 mL/min — ABNORMAL LOW (ref >60)
GFR calc non Af Amer: 39 mL/min — ABNORMAL LOW (ref >60)
Glucose, Bld: 194 mg/dL — ABNORMAL HIGH (ref 70–99)
Potassium: 3.4 mmol/L — ABNORMAL LOW (ref 3.5–5.1)
Sodium: 137 mmol/L (ref 135–145)

## 2019-11-13 LAB — CBC
HCT: 44 % (ref 39.0–52.0)
Hemoglobin: 15 g/dL (ref 13.0–17.0)
MCH: 32.6 pg (ref 26.0–34.0)
MCHC: 34.1 g/dL (ref 30.0–36.0)
MCV: 95.7 fL (ref 80.0–100.0)
Platelets: 109 10*3/uL — ABNORMAL LOW (ref 150–400)
RBC: 4.6 MIL/uL (ref 4.22–5.81)
RDW: 15.2 % (ref 11.5–15.5)
WBC: 4 10*3/uL (ref 4.0–10.5)
nRBC: 0 % (ref 0.0–0.2)

## 2019-11-13 LAB — FIBRINOGEN: Fibrinogen: 642 mg/dL — ABNORMAL HIGH (ref 210–475)

## 2019-11-13 LAB — STREP PNEUMONIAE URINARY ANTIGEN: Strep Pneumo Urinary Antigen: NEGATIVE

## 2019-11-13 LAB — ECHOCARDIOGRAM COMPLETE
Height: 70 in
Weight: 3356.8 oz

## 2019-11-13 LAB — TRIGLYCERIDES: Triglycerides: 82 mg/dL (ref <150)

## 2019-11-13 LAB — LACTIC ACID, PLASMA: Lactic Acid, Venous: 1.7 mmol/L (ref 0.5–1.9)

## 2019-11-13 LAB — C-REACTIVE PROTEIN: CRP: 8.2 mg/dL — ABNORMAL HIGH (ref <1.0)

## 2019-11-13 LAB — FERRITIN: Ferritin: 492 ng/mL — ABNORMAL HIGH (ref 24–336)

## 2019-11-13 LAB — PROCALCITONIN: Procalcitonin: 0.25 ng/mL

## 2019-11-13 LAB — FIBRIN DERIVATIVES D-DIMER (ARMC ONLY): Fibrin derivatives D-dimer (ARMC): 2805.48 ng/mL (FEU) — ABNORMAL HIGH (ref 0.00–499.00)

## 2019-11-13 LAB — HIV ANTIBODY (ROUTINE TESTING W REFLEX): HIV Screen 4th Generation wRfx: NONREACTIVE

## 2019-11-13 LAB — LACTATE DEHYDROGENASE: LDH: 272 U/L — ABNORMAL HIGH (ref 98–192)

## 2019-11-13 LAB — MAGNESIUM: Magnesium: 1.5 mg/dL — ABNORMAL LOW (ref 1.7–2.4)

## 2019-11-13 MED ORDER — COLCHICINE 0.6 MG PO TABS: 0.6000 mg | ORAL_TABLET | Freq: Two times a day (BID) | ORAL | Status: DC | PRN

## 2019-11-13 MED ORDER — METHYLPREDNISOLONE SODIUM SUCC 40 MG IJ SOLR
40.0000 mg | Freq: Every day | INTRAMUSCULAR | Status: DC
  Administered 2019-11-13 – 2019-11-14 (×2): 40 mg via INTRAVENOUS
  Filled 2019-11-13 (×3): qty 1

## 2019-11-13 MED ORDER — TORSEMIDE 20 MG PO TABS
20.0000 mg | ORAL_TABLET | Freq: Two times a day (BID) | ORAL | Status: DC
  Administered 2019-11-13 – 2019-11-14 (×2): 20 mg via ORAL
  Filled 2019-11-13 (×4): qty 1

## 2019-11-13 MED ORDER — METHYLPREDNISOLONE SODIUM SUCC 40 MG IJ SOLR
40.0000 mg | Freq: Every day | INTRAMUSCULAR | Status: DC
  Filled 2019-11-13: qty 1

## 2019-11-13 MED ORDER — VITAMIN B-12 1000 MCG PO TABS
1000.0000 ug | ORAL_TABLET | Freq: Every day | ORAL | Status: DC
  Administered 2019-11-13 – 2019-11-14 (×2): 1000 ug via ORAL
  Filled 2019-11-13 (×2): qty 1

## 2019-11-13 MED ORDER — LOSARTAN POTASSIUM 50 MG PO TABS: 50.0000 mg | ORAL_TABLET | Freq: Every day | ORAL | Status: DC

## 2019-11-13 MED ORDER — METHYLPREDNISOLONE SODIUM SUCC 40 MG IJ SOLR: 40.0000 mg | Freq: Every day | INTRAMUSCULAR | Status: DC

## 2019-11-13 MED ORDER — LEVOFLOXACIN IN D5W 750 MG/150ML IV SOLN
750.0000 mg | INTRAVENOUS | Status: DC
  Administered 2019-11-13: 750 mg via INTRAVENOUS
  Filled 2019-11-13: qty 150

## 2019-11-13 MED ORDER — HYDRALAZINE HCL 25 MG PO TABS
25.0000 mg | ORAL_TABLET | Freq: Four times a day (QID) | ORAL | Status: DC | PRN
  Filled 2019-11-13: qty 1

## 2019-11-13 MED ORDER — ALLOPURINOL 300 MG PO TABS
600.0000 mg | ORAL_TABLET | Freq: Every day | ORAL | Status: DC
  Filled 2019-11-13: qty 2

## 2019-11-13 MED ORDER — ACETAMINOPHEN 650 MG RE SUPP: 650.0000 mg | Freq: Four times a day (QID) | RECTAL | Status: DC | PRN

## 2019-11-13 MED ORDER — GUAIFENESIN ER 600 MG PO TB12
600.0000 mg | ORAL_TABLET | Freq: Two times a day (BID) | ORAL | Status: DC
  Administered 2019-11-13 – 2019-11-14 (×4): 600 mg via ORAL
  Filled 2019-11-13 (×5): qty 1

## 2019-11-13 MED ORDER — ACETAMINOPHEN 325 MG PO TABS: 650.0000 mg | ORAL_TABLET | Freq: Four times a day (QID) | ORAL | Status: DC | PRN

## 2019-11-13 MED ORDER — ENOXAPARIN SODIUM 40 MG/0.4ML ~~LOC~~ SOLN
40.0000 mg | SUBCUTANEOUS | Status: DC
  Administered 2019-11-13 – 2019-11-14 (×2): 40 mg via SUBCUTANEOUS
  Filled 2019-11-13 (×3): qty 0.4

## 2019-11-13 MED ORDER — PANTOPRAZOLE SODIUM 40 MG PO TBEC
40.0000 mg | DELAYED_RELEASE_TABLET | Freq: Every day | ORAL | Status: DC
  Administered 2019-11-13 – 2019-11-14 (×2): 40 mg via ORAL
  Filled 2019-11-13 (×2): qty 1

## 2019-11-13 MED ORDER — MAGNESIUM HYDROXIDE 400 MG/5ML PO SUSP: 30.0000 mL | Freq: Every day | ORAL | Status: DC | PRN

## 2019-11-13 MED ORDER — FLUTICASONE PROPIONATE 50 MCG/ACT NA SUSP
2.0000 | Freq: Every day | NASAL | Status: DC
  Administered 2019-11-13 – 2019-11-14 (×2): 2 via NASAL
  Filled 2019-11-13: qty 16

## 2019-11-13 MED ORDER — ONDANSETRON HCL 4 MG PO TABS
4.0000 mg | ORAL_TABLET | Freq: Four times a day (QID) | ORAL | Status: DC | PRN
  Filled 2019-11-13: qty 1

## 2019-11-13 MED ORDER — ATORVASTATIN CALCIUM 20 MG PO TABS
40.0000 mg | ORAL_TABLET | Freq: Every day | ORAL | Status: DC
  Administered 2019-11-13: 40 mg via ORAL
  Filled 2019-11-13 (×2): qty 2

## 2019-11-13 MED ORDER — POTASSIUM CHLORIDE 20 MEQ PO PACK
40.0000 meq | PACK | Freq: Once | ORAL | Status: AC
  Administered 2019-11-13: 40 meq via ORAL

## 2019-11-13 MED ORDER — SODIUM CHLORIDE 0.9 % IV SOLN: INTRAVENOUS | Status: DC

## 2019-11-13 MED ORDER — ALLOPURINOL 300 MG PO TABS
150.0000 mg | ORAL_TABLET | Freq: Every day | ORAL | Status: DC
  Administered 2019-11-13 – 2019-11-14 (×2): 150 mg via ORAL
  Filled 2019-11-13 (×2): qty 0.5

## 2019-11-13 MED ORDER — ONDANSETRON HCL 4 MG/2ML IJ SOLN: 4.0000 mg | Freq: Four times a day (QID) | INTRAMUSCULAR | Status: DC | PRN

## 2019-11-13 MED ORDER — TRAZODONE HCL 50 MG PO TABS
25.0000 mg | ORAL_TABLET | Freq: Every evening | ORAL | Status: DC | PRN
  Administered 2019-11-14: 25 mg via ORAL
  Filled 2019-11-13: qty 0.5
  Filled 2019-11-13: qty 1

## 2019-11-13 MED ORDER — HYDROCOD POLST-CPM POLST ER 10-8 MG/5ML PO SUER: 5.0000 mL | Freq: Two times a day (BID) | ORAL | Status: DC | PRN

## 2019-11-13 MED ORDER — ASPIRIN EC 81 MG PO TBEC
81.0000 mg | DELAYED_RELEASE_TABLET | Freq: Every day | ORAL | Status: DC
  Administered 2019-11-13 – 2019-11-14 (×2): 81 mg via ORAL
  Filled 2019-11-13 (×3): qty 1

## 2019-11-13 MED ORDER — CARVEDILOL 12.5 MG PO TABS
12.5000 mg | ORAL_TABLET | Freq: Every day | ORAL | Status: DC
  Administered 2019-11-13 – 2019-11-14 (×2): 12.5 mg via ORAL
  Filled 2019-11-13: qty 1
  Filled 2019-11-13: qty 2
  Filled 2019-11-13: qty 1

## 2019-11-13 NOTE — H&P (Addendum)
[facility] NAME: Taylor [name]    MR#:  [id number]  DATE OF BIRTH:  1941/02/04  DATE OF ADMISSION: 11/13/2019  PRIMARY CARE PHYSICIAN: Birdie Sons, MD   REQUESTING/REFERRING PHYSICIAN: Lurline Hare, MD CHIEF COMPLAINT:   Chief Complaint  Patient presents with  . Shortness of Breath    HISTORY OF PRESENT ILLNESS:  Taylor [name]  is a 78 y.o. male with a known history of multiple medical problems the are mentioned below including cardiac disease, CHF, dyslipidemia, hypertension, status post AICD, who presented to the emergency room with acute onset of worsening dyspnea over the last 7 weeks that got significantly worse today when he was seen at urgent care and advised to come to the emergency room.  He admitted to cough initially productive of yellowish sputum and later brownish with occasional blood tinged sputum.  He denied any fever or chills.  No nausea vomiting or abdominal pain.  No loss of taste or smell.  He admitted to mild headache without dizziness or blurred vision.  No chest pain or palpitations.  He had mild rhinorrhea without sore throat or earache.  The patient had 3 telehealth visits and 3 courses of doxycycline each for 10 days over the last several weeks.  Upon presentation to the emergency room, respiratory to 28 and blood pressure 148/84 with pulse oximetry of 93% on room air and later dropped to 88% on room air and respiratory rate was up to 26.  He was placed on O2 2 L/min with pulse oximetry up to 95%.  Labs were remarkable for mild hypokalemia of 3.4 and BUN of 39 with creatinine 1.87 close to previous levels and AST of 79 with ALT of 32, total bili 1.7.  BNP was 102 with high-sensitivity troponin I of 206.  Procalcitonin came back 0.25.  CBC was unremarkable except for platelets 123.  SARS-CoV-2 PCR came back negative.  We added inflammatory markers and his ferritin came back elevated at 492 with LDH of 272 and repeat troponin now is  193.  CRP is currently pending.  Lactic acid was 1.7.  D-dimer came back elevated at 28-5.48.  The patient had 2 blood cultures.  Portable chest x-ray showed new multifocal airspace opacities bilaterally greater in the right midlung zone with findings suspicious for multifocal pneumonia or developing pulmonary edema and probable small bilateral pleural effusions as well as stable cardiomegaly.  Chest CTA revealed no PE but did show patchy groundglass opacities throughout the right greater than the left lung with findings favoring atypical viral infection as in COVID-19.  There were right hilar and mild mediastinal adenopathy, likely reactive however nonspecific.  Follow-up CT will be required for resolution.  It also showed diffuse bilateral pleural nodularity which has progressively calcified since 2016 it could be related to prior asbestos exposure.  It showed cardiomegaly with trace contrast refluxes into the IVC suggesting elevated right heart pressure as well as aortic atherosclerosis.  He was given 125 mg IV Solu-Medrol as well as duo nebs.  He will be admitted to a medical monitored bed for further evaluation and management. PAST MEDICAL HISTORY:   Past Medical History:  Diagnosis Date  . AAA (abdominal aortic aneurysm) [facility]) 06/03/2007   [facility]; Dr. Kellie Simmering  . AICD (automatic cardioverter/defibrillator) present   . Barrett's esophagus   . Bladder cancer ([place])   . Bradycardia   . CAD (coronary artery disease)   . CHF (congestive heart failure) (  HCC)   . Cluster headache   . DDD (degenerative disc disease), lumbar   . Dyspnea    WITH EXERTION  . Edema    LEFT ANKLE  . Fracture of skull base ([place]) 1997   due to fall  . GERD (gastroesophageal reflux disease)   . Gout   . History of bladder cancer 12/1995  . Hyperlipidemia   . Hypertension   . Malignant melanoma ([place]) 12/2012   right dorsal forearm excised  . Myocardial infarction ([place])    LAST 2014  .  Osteoarthritis of knee   . Pacemaker 10/10/2006  . Pneumonia    2016  . Pre-diabetes   . Psoriasis   . Rib fracture 1997   due to fall  . Sleep apnea    CPAP  . Stroke ([place])   . Venous incompetence     PAST SURGICAL HISTORY:   Past Surgical History:  Procedure Laterality Date  . ABDOMINAL AORTIC ANEURYSM REPAIR  06/03/2007   [facility]; Dr. Kellie Simmering  . ANGIOPLASTY  1994   MI  . BLADDER TUMOR EXCISION  12/1995  . CATARACT EXTRACTION W/PHACO Left 10/22/2016   Procedure: CATARACT EXTRACTION PHACO AND INTRAOCULAR LENS PLACEMENT (IOC);  Surgeon: Birder Robson, MD;  Location: [facility] ORS;  Service: Ophthalmology;  Laterality: Left;  [place] 47.7 AP% 18.4 CDE 8.78 Fluid pack lot # [id number]  . CATARACT EXTRACTION W/PHACO Right 12/10/2016   Procedure: CATARACT EXTRACTION PHACO AND INTRAOCULAR LENS PLACEMENT (IOC);  Surgeon: Birder Robson, MD;  Location: [facility] ORS;  Service: Ophthalmology;  Laterality: Right;  [place] 00:39 AP% 23.3 CDE 9.13 Fluid pack lot # [id number]  . CORONARY ANGIOPLASTY     STENTS X 5  . CORONARY ARTERY BYPASS GRAFT  09/22/2006   four  . ELBOW BURSA SURGERY     DUE TO GOUT  . INSERT / REPLACE / REMOVE PACEMAKER    . MELANOMA EXCISION  12/2012   Right forearm    SOCIAL HISTORY:   Social History   Tobacco Use  . Smoking status: Former Smoker    Packs/day: 1.00    Types: Cigarettes    Quit date: 11/26/1999    Years since quitting: 19.9  . Smokeless tobacco: Never Used  Substance Use Topics  . Alcohol use: No    FAMILY HISTORY:   Family History  Problem Relation Age of Onset  . Cancer Mother        Melanoma skin cancer  . Heart attack Father 72  . Cancer Father        throat cancer  . Arthritis Brother     DRUG ALLERGIES:   Allergies  Allergen Reactions  . Amlodipine Besylate Swelling    Had a reaction when taking with colcrys   . Crestor [Rosuvastatin]     Muscle cramps and pain  . Rocephin [Ceftriaxone]     unknown     REVIEW OF SYSTEMS:   ROS As per history of present illness. All pertinent systems were reviewed above. Constitutional,  HEENT, cardiovascular, respiratory, GI, GU, musculoskeletal, neuro, psychiatric, endocrine,  integumentary and hematologic systems were reviewed and are otherwise  negative/unremarkable except for positive findings mentioned above in the HPI.   MEDICATIONS AT HOME:   Prior to Admission medications   Medication Sig Start Date End Date Taking? Authorizing Provider  allopurinol (ZYLOPRIM) 300 MG tablet Take 2 tablets (600 mg total) by mouth daily. 01/27/19   Birdie Sons, MD  aspirin 81 MG EC tablet Take 81  mg by mouth daily.     [provider]  atorvastatin (LIPITOR) 40 MG tablet TAKE 1 TABLET BY MOUTH EVERY DAY 12/22/18   Birdie Sons, MD  brompheniramine-pseudoephedrine-DM 30-2-10 MG/5ML syrup Take 5 mLs by mouth 3 (three) times daily as needed. Patient not taking: Reported on 10/25/2019 09/30/19   Chrismon, Vickki Muff, PA  carvedilol (COREG) 12.5 MG tablet TAKE 1 TABLET BY MOUTH DAILY Patient not taking: Reported on 10/25/2019 08/31/19   Birdie Sons, MD  colchicine 0.6 MG tablet Take 2 pills on day 1. Then take 1 pill each of the following day until gout flare resolved. Patient not taking: Reported on 03/31/2019 12/01/18   Trinna Post, PA-C  Cyanocobalamin (VITAMIN B-12 CR) 1000 MCG TBCR Take 1,000 mcg by mouth daily.     [provider]  doxycycline (VIBRA-TABS) 100 MG tablet Take 1 tablet (100 mg total) by mouth 2 (two) times daily for 10 days. 11/08/19 11/18/19  Birdie Sons, MD  fluticasone (FLONASE) 50 MCG/ACT nasal spray Place 2 sprays into both nostrils daily. 11/08/19 12/08/19  Birdie Sons, MD  indomethacin (INDOCIN) 50 MG capsule Take 1 capsule (50 mg total) by mouth 3 (three) times daily as needed (for gout flares). Patient not taking: Reported on 09/20/2019 01/22/19   Birdie Sons, MD  losartan (COZAAR) 50 MG tablet  TAKE 2 TABLETS(100 MG) BY MOUTH DAILY Patient taking differently: 25 mg.  07/08/19   Birdie Sons, MD  mupirocin ointment (BACTROBAN) 2 % Apply 1 application topically 2 (two) times daily. 01/22/19   Birdie Sons, MD  pantoprazole (PROTONIX) 40 MG tablet TAKE 1 TABLET BY MOUTH EVERY DAY 04/09/19   Birdie Sons, MD  torsemide (DEMADEX) 20 MG tablet TAKE 2 TABLETS(40 MG) BY MOUTH DAILY 05/25/19   Birdie Sons, MD  triamcinolone ointment (KENALOG) 0.1 % Apply 1 application topically 2 (two) times daily as needed (itching). 03/31/19   Birdie Sons, MD  valsartan (DIOVAN) 160 MG tablet TAKE 1 TABLET BY MOUTH DAILY( TAKE IN PLACE OF LOSARTAN) Patient not taking: Reported on 09/20/2019 08/26/18   Birdie Sons, MD      VITAL SIGNS:  Blood pressure 103/79, pulse 63, temperature 98.9 F (37.2 C), temperature source Oral, resp. rate (!) 29, height 5\' 10"  (1.778 m), weight 95.2 kg, SpO2 90 %.  PHYSICAL EXAMINATION:  Physical Exam  GENERAL:  78 y.o.-year-old patient lying in the bed with no acute distress.  EYES: Pupils equal, round, reactive to light and accommodation. No scleral icterus. Extraocular muscles intact.  HEENT: Head atraumatic, normocephalic. Oropharynx and nasopharynx clear.  NECK:  Supple, no jugular venous distention. No thyroid enlargement, no tenderness.  LUNGS: Normal breath sounds bilaterally, no wheezing, rales,rhonchi or crepitation. No use of accessory muscles of respiration.  CARDIOVASCULAR: Regular rate and rhythm, S1, S2 normal. No murmurs, rubs, or gallops.  ABDOMEN: Soft, nondistended, nontender. Bowel sounds present. No organomegaly or mass.  EXTREMITIES: No pedal edema, cyanosis, or clubbing.  NEUROLOGIC: Cranial nerves II through XII are intact. Muscle strength 5/5 in all extremities. Sensation intact. Gait not checked.  PSYCHIATRIC: The patient is alert and oriented x 3.  Normal affect and good eye contact. SKIN: No obvious rash, lesion, or ulcer.    LABORATORY PANEL:   CBC Recent Labs  Lab 11/12/19 1934  WBC 6.5  HGB 15.9  HCT 47.5  PLT 123*   ------------------------------------------------------------------------------------------------------------------  Chemistries  Recent Labs  Lab 11/12/19 1934  NA  138  K 3.4*  CL 96*  CO2 29  GLUCOSE 146*  BUN 39*  CREATININE 1.87*  CALCIUM 8.4*  AST 79*  ALT 32  ALKPHOS 87  BILITOT 1.7*   ------------------------------------------------------------------------------------------------------------------  Cardiac Enzymes No results for input(s): TROPONINI in the last 168 hours. ------------------------------------------------------------------------------------------------------------------  RADIOLOGY:  CT Angio Chest PE W and/or Wo Contrast  Result Date: 11/12/2019 CLINICAL DATA:  Shortness of breath.  Dyspnea. EXAM: CT ANGIOGRAPHY CHEST WITH CONTRAST TECHNIQUE: Multidetector CT imaging of the chest was performed using the standard protocol during bolus administration of intravenous contrast. Multiplanar CT image reconstructions and MIPs were obtained to evaluate the vascular anatomy. CONTRAST:  50mL OMNIPAQUE IOHEXOL 350 MG/ML SOLN COMPARISON:  Radiograph earlier this day.  Chest CTA 07/13/2015 FINDINGS: Cardiovascular: There are no filling defects within the pulmonary arteries to suggest pulmonary embolus. Aortic atherosclerosis. Cannot assess for dissection given phase of contrast tailored to pulmonary artery evaluation. Multi chamber cardiomegaly. Calcification of native coronary arteries, post CABG. Contrast refluxes minimally into the IVC. No pericardial effusion. Left-sided pacemaker in place with the leads in the right atrium and ventricle. Mediastinum/Nodes: Right hilar adenopathy with nodes measuring up to 18 mm short axis. Right subcarinal/infrahilar node measures 10 mm short axis. Multiple prominent right paratracheal nodes, largest 12 mm short axis. There scattered  highest mediastinal nodes. Small prevascular nodes. Calcified left hilar nodes consistent with prior granulomatous disease. Adjacent to the calcified left hilar node is a prominent 9 mm hilar node. Esophagus is patulous. No esophageal wall thickening. No thyroid nodule. Lungs/Pleura: Bilateral pleural nodularity and pleural plaques which have calcified since 2016. No frank pleural effusion. Patchy ground-glass opacities throughout the entire right lung and to a lesser extent left upper and lower lobe. Trace fissural thickening of the minor fissure. Trachea and mainstem bronchi are patent. No dominant pulmonary mass. Upper Abdomen: No acute findings. Musculoskeletal: Post median sternotomy. Stable bone island in lower thoracic vertebra. Remote left rib fractures. There are no acute or suspicious osseous abnormalities. Review of the MIP images confirms the above findings. IMPRESSION: 1. No pulmonary embolus. 2. Patchy ground-glass opacities throughout the right greater than left lung. Findings favor infection, including atypical viral organisms ( COVID-19). Other infectious or non infectious etiologies are also considered, distribution is not typical of pulmonary edema. 3. Right hilar and mild mediastinal adenopathy. This is likely reactive, however nonspecific. Consider follow-up CT after resolution of symptoms. 4. Diffuse bilateral pleural nodularity which has progressively calcified since 2016. Findings most consistent with prior asbestos exposure. 5. Cardiomegaly. Trace contrast refluxes into the IVC suggesting elevated right heart pressures. 6.  Aortic Atherosclerosis (ICD10-I70.0). Electronically Signed   By: Keith Rake M.D.   On: 11/12/2019 23:29   DG Chest Port 1 View  Result Date: 11/12/2019 CLINICAL DATA:  Shortness of breath x7 weeks. EXAM: PORTABLE CHEST 1 VIEW COMPARISON:  December 04, 2018 FINDINGS: There are new multifocal airspace opacities bilaterally, greatest in the right mid lung zone. A  multi lead left-sided pacemaker is noted with stable appearance of the leads. The patient is status post prior median sternotomy. The inferior-most sternotomy wire is fractured as is the superior most sternotomy wire. The heart size is significantly enlarged. There are multiple old left-sided rib fractures. There is no pneumothorax. There are probable small bilateral pleural effusions. There is likely an old healed right clavicle fracture. IMPRESSION: 1. New multifocal airspace opacities bilaterally, greatest in the right mid lung zone. Findings are suspicious for multifocal pneumonia or developing pulmonary edema.  2. Probable small bilateral pleural effusions. 3. Stable cardiomegaly. Electronically Signed   By: Constance Holster M.D.   On: 11/12/2019 20:17      IMPRESSION AND PLAN:   1.  Multifocal pneumonia concerning for Covid with elevated inflammatory markers however with negative PCR.  For now the patient will be admitted to medical monitored bed and will place him on continued IV antibiotic therapy with Levaquin given his cephalosporins allergy.  Mucolytic's will be provided.  We will follow sputum and blood culture as well as urine pneumonia antigens.  The patient will be placed on IV Decadron given elevated inflammatory markers.  He has elevated D-dimer however with no evidence for PE.  We will place him on prophylactic Lovenox at this time.  We will follow elevated troponin I and obtain a 2D echo for now.  This could certainly be demand ischemia.  2.  Acute hypoxic respiratory failure secondary to #1.  O2 protocol will be followed.  3.  Hypokalemia.  Potassium will be replaced and magnesium level will be checked.  4.  Mild acute kidney injury on top of stage IIIb chronic kidney disease.  The patient will be gently hydrated with IV normal saline and will follow BMP.  We will hold off Cozaar and Demadex for now.  5.  Dyslipidemia.  Statin therapy will be briefly held off given elevated  transaminases which will be followed.  6.  Hypertension.  Coreg will be resumed and will place the patient on as needed p.o. hydralazine.  ARB will be held off for now.  7.  Gout.  Allopurinol and as needed colchicine will be continued.  We will hold off Indocin for now.  8.  DVT prophylaxis.  Subtenons Lovenox  All the records are reviewed and case discussed with ED provider. The plan of care was discussed in details with the patient (and family). I answered all questions. The patient agreed to proceed with the above mentioned plan. Further management will depend upon hospital course.   CODE STATUS: Full code  TOTAL TIME TAKING CARE OF THIS PATIENT: 55 minutes.    Christel Mormon M.D on 11/13/2019 at 12:11 AM  [facility]   From 7 PM-7 AM, contact night-coverage [email or web address]  CC: Primary care physician; Birdie Sons, MD   Note: This dictation was prepared with Dragon dictation along with smaller phrase technology. Any transcriptional errors that result from this process are unintentional.

## 2019-11-13 NOTE — ED Notes (Signed)
NT at bedside.  

## 2019-11-13 NOTE — ED Notes (Signed)
Patient placed on 2L [place] per MD Beather Arbour

## 2019-11-13 NOTE — ED Notes (Signed)
Pt resting in bed talking to daughter on the phone.

## 2019-11-13 NOTE — ED Notes (Signed)
Report called to [facility] RN who will resume care, pt will transport to room 237 at this time.

## 2019-11-13 NOTE — ED Notes (Signed)
Echo at bedside

## 2019-11-13 NOTE — Progress Notes (Signed)
Pharmacy Antibiotic Note  Taylor [name] is a 78 y.o. male admitted on 11/12/2019 with pneumonia.  Pharmacy has been consulted for levaquin dosing.  Plan: Will start patient on levaquin 750 mg IV q48h per CrCl 20 - < 50 ml/min. QTc on EKG shows 460 w/ atrial paced rhythm. Will continue to monitor.  Height: 5\' 10"  (177.8 cm) Weight: 209 lb 12.8 oz (95.2 kg) IBW/kg (Calculated) : 73  Temp (24hrs), Avg:98.5 F (36.9 C), Min:98.1 F (36.7 C), Max:98.9 F (37.2 C)  Recent Labs  Lab 11/12/19 1934 11/13/19 0005  WBC 6.5  --   CREATININE 1.87*  --   LATICACIDVEN  --  1.7    Estimated Creatinine Clearance: 38.3 mL/min (A) (by C-G formula based on SCr of 1.87 mg/dL (H)).    Allergies  Allergen Reactions  . Amlodipine Besylate Swelling    Had a reaction when taking with colcrys   . Crestor [Rosuvastatin]     Muscle cramps and pain  . Rocephin [Ceftriaxone]     unknown   Thank you for allowing pharmacy to be a part of this patient's care.  Tobie Lords, PharmD, BCPS Clinical Pharmacist 11/13/2019 6:12 AM

## 2019-11-13 NOTE — ED Notes (Signed)
Lunch order placed

## 2019-11-13 NOTE — ED Notes (Signed)
Pt eating meal tray. NAD at this time.

## 2019-11-13 NOTE — Progress Notes (Signed)
PROGRESS NOTE    Taylor [name]  YKZ:[id number] DOB: Nov 24, 1941 DOA: 11/12/2019 PCP: Birdie Sons, MD      Brief Narrative:  Mr. Scurlock is a 78 y.o. M with CAD s/p CABG, CHF 20-25% ICD in place, CKD III, HTN, bradycardia with pacer, CKD IIIb baseline Cr 1.7 and psoriasis who presented with several weeks fatigue, progressive SOB and URI symptoms, no response to outpatient treatment with doxycycline.  Patient first developed symptoms in early November.  Fatigue first, followed by sore throat, cough.  Had PCP telemedicine apointment, took doxycycline 10 days, cough cleared up, but symptoms progressed to his sinuses.  Tested negative for COVID on 12/1.   Then had worsening headaches, sinus pressure for a few weeks, treated again with doxycycline, gradually sinus symptoms improved but then had progressive shortness of breath and so finally came to the ER.    In the ER, RR 28, SpO2 88% on room air.  COVID negative.  BNP 102.  Procal 0.25.   CTA showed no PE, mild reflux into IVC, but patchy asymmetric opacities primarily in the right lung.          Assessment & Plan:  Acute vs chronic hypoxic respiratory failure due to multifocal pneumonia This illness has been progressive for 6-7 weeks.  Failed two rounds of outpatient doxycycline.  Advanced heart disease, but no symptoms of orthopnea, PND or swelling at all.  No prior history of lung disease.  Differential includes atypical pneumonia, missed COVID, non-infectious pneumonia. -Consult Pulmonology -Solu-medrol 40 mg daily -Continue Levaquin -Follow blood cultures   Chronic systolic CHF He has no symptoms of CHF, no swelilng on exam, BNP very low for sCHF, no effusions on CT, doubt this is fluid overload. -Continue home torsemide  -Continue carvedilol -Hold Cozaar  Elevated troponin No chest pain.  Doubt CHF.  PE ruled out.   Doubt ACS. -Obtain echo cardiogram  Coronary disease secondary prevention -Continue aspirin,  atorvastatin, carvedilol  CKD IIIb Stable relative to baseline  Hilar adenopathy Likely reactive -Consider follow-up CT after resolution of symptoms.  Gout -Continue allopurinol  GERD -Continue pantoprazole  Thrombocytopenia Mild, no bleeding       DVT prophylaxis: Lovenoxl Code Status: FULL Family Communication:  MDM and disposition Plan: This is a no charge note.  For further details, please see H&P by my partner Dr. Sidney Ace from earlier today.  The below labs and imaging reports were reviewed and summarized above.    The patient was admitted with non-resolving pneumonia.  Still hypoxic.    Objective: Vitals:   11/13/19 0200 11/13/19 0230 11/13/19 0300 11/13/19 0330  BP: 114/82 128/82 124/84 114/85  Pulse: 61 61 62 62  Resp:    17  Temp:      TempSrc:      SpO2: 95% 96% 94% 93%  Weight:      Height:       No intake or output data in the 24 hours ending 11/13/19 0832 Filed Weights   11/12/19 1918  Weight: 95.2 kg    Examination: The patient was seen and examined.      Data Reviewed: I have personally reviewed following labs and imaging studies:  CBC: Recent Labs  Lab 11/12/19 1934 11/13/19 0701  WBC 6.5 4.0  HGB 15.9 15.0  HCT 47.5 44.0  MCV 96.5 95.7  PLT 123* 939*   Basic Metabolic Panel: Recent Labs  Lab 11/12/19 1934  NA 138  K 3.4*  CL 96*  CO2 29  GLUCOSE 146*  BUN 39*  CREATININE 1.87*  CALCIUM 8.4*   GFR: Estimated Creatinine Clearance: 38.3 mL/min (A) (by C-G formula based on SCr of 1.87 mg/dL (H)). Liver Function Tests: Recent Labs  Lab 11/12/19 1934  AST 79*  ALT 32  ALKPHOS 87  BILITOT 1.7*  PROT 8.0  ALBUMIN 3.6   No results for input(s): LIPASE, AMYLASE in the last 168 hours. No results for input(s): AMMONIA in the last 168 hours. Coagulation Profile: Recent Labs  Lab 11/12/19 1934  INR 1.0   Cardiac Enzymes: No results for input(s): CKTOTAL, CKMB, CKMBINDEX, TROPONINI in the last 168 hours. BNP (last  3 results) No results for input(s): PROBNP in the last 8760 hours. HbA1C: No results for input(s): HGBA1C in the last 72 hours. CBG: No results for input(s): GLUCAP in the last 168 hours. Lipid Profile: Recent Labs    11/13/19 0035  TRIG 82   Thyroid Function Tests: No results for input(s): TSH, T4TOTAL, FREET4, T3FREE, THYROIDAB in the last 72 hours. Anemia Panel: Recent Labs    11/13/19 0035  FERRITIN 492*   Urine analysis:    Component Value Date/Time   BILIRUBINUR negative 03/31/2018 1452   PROTEINUR Negative 03/31/2018 1452   UROBILINOGEN 0.2 03/31/2018 1452   NITRITE Negative 03/31/2018 1452   LEUKOCYTESUR Negative 03/31/2018 1452   Sepsis Labs: @LABRCNTIP (procalcitonin:4,lacticacidven:4)  ) Recent Results (from the past 240 hour(s))  Respiratory Panel by RT PCR (Flu A&B, Covid) - Nasopharyngeal Swab     Status: None   Collection Time: 11/12/19  8:35 PM   Specimen: Nasopharyngeal Swab  Result Value Ref Range Status   SARS Coronavirus 2 by RT PCR NEGATIVE NEGATIVE Final    Comment: (NOTE) SARS-CoV-2 target nucleic acids are NOT DETECTED. The SARS-CoV-2 RNA is generally detectable in upper respiratoy specimens during the acute phase of infection. The lowest concentration of SARS-CoV-2 viral copies this assay can detect is 131 copies/mL. A negative result does not preclude SARS-Cov-2 infection and should not be used as the sole basis for treatment or other patient management decisions. A negative result may occur with  improper specimen collection/handling, submission of specimen other than nasopharyngeal swab, presence of viral mutation(s) within the areas targeted by this assay, and inadequate number of viral copies (<131 copies/mL). A negative result must be combined with clinical observations, patient history, and epidemiological information. The expected result is Negative. Fact Sheet for Patients:  PinkCheek.be Fact Sheet  for Healthcare Providers:  GravelBags.it This test is not yet ap proved or cleared by the [place] FDA and  has been authorized for detection and/or diagnosis of SARS-CoV-2 by FDA under an Emergency Use Authorization (EUA). This EUA will remain  in effect (meaning this test can be used) for the duration of the COVID-19 declaration under Section 564(b)(1) of the Act, 21 U.S.C. section 360bbb-3(b)(1), unless the authorization is terminated or revoked sooner.    Influenza A by PCR NEGATIVE NEGATIVE Final   Influenza B by PCR NEGATIVE NEGATIVE Final    Comment: (NOTE) The Xpert Xpress SARS-CoV-2/FLU/RSV assay is intended as an aid in  the diagnosis of influenza from Nasopharyngeal swab specimens and  should not be used as a sole basis for treatment. Nasal washings and  aspirates are unacceptable for Xpert Xpress SARS-CoV-2/FLU/RSV  testing. Fact Sheet for Patients: PinkCheek.be Fact Sheet for Healthcare Providers: GravelBags.it This test is not yet approved or cleared by the [place] FDA and  has been authorized for detection and/or diagnosis of SARS-CoV-2  by  FDA under an Emergency Use Authorization (EUA). This EUA will remain  in effect (meaning this test can be used) for the duration of the  Covid-19 declaration under Section 564(b)(1) of the Act, 21  U.S.C. section 360bbb-3(b)(1), unless the authorization is  terminated or revoked. Performed at [facility] [address] [place], [place] [address]   Culture, blood (routine x 2)     Status: None (Preliminary result)   Collection Time: 11/13/19 12:04 AM   Specimen: BLOOD  Result Value Ref Range Status   Specimen Description BLOOD LEFT ANTECUBITAL  Final   Special Requests   Final    BOTTLES DRAWN AEROBIC AND ANAEROBIC Blood Culture results may not be optimal due to an excessive volume of blood received in culture bottles    Culture   Final    NO GROWTH < 12 HOURS Performed at [facility], [address] [place], [place] [address]    Report Status PENDING  Incomplete  Culture, blood (routine x 2)     Status: None (Preliminary result)   Collection Time: 11/13/19 12:06 AM   Specimen: BLOOD  Result Value Ref Range Status   Specimen Description BLOOD RIGHT HAND  Final   Special Requests   Final    BOTTLES DRAWN AEROBIC AND ANAEROBIC Blood Culture adequate volume   Culture   Final    NO GROWTH < 12 HOURS Performed at [facility], [address], [place],  [address]    Report Status PENDING  Incomplete         Radiology Studies: CT Angio Chest PE W and/or Wo Contrast  Result Date: 11/12/2019 CLINICAL DATA:  Shortness of breath.  Dyspnea. EXAM: CT ANGIOGRAPHY CHEST WITH CONTRAST TECHNIQUE: Multidetector CT imaging of the chest was performed using the standard protocol during bolus administration of intravenous contrast. Multiplanar CT image reconstructions and MIPs were obtained to evaluate the vascular anatomy. CONTRAST:  44mL OMNIPAQUE IOHEXOL 350 MG/ML SOLN COMPARISON:  Radiograph earlier this day.  Chest CTA 07/13/2015 FINDINGS: Cardiovascular: There are no filling defects within the pulmonary arteries to suggest pulmonary embolus. Aortic atherosclerosis. Cannot assess for dissection given phase of contrast tailored to pulmonary artery evaluation. Multi chamber cardiomegaly. Calcification of native coronary arteries, post CABG. Contrast refluxes minimally into the IVC. No pericardial effusion. Left-sided pacemaker in place with the leads in the right atrium and ventricle. Mediastinum/Nodes: Right hilar adenopathy with nodes measuring up to 18 mm short axis. Right subcarinal/infrahilar node measures 10 mm short axis. Multiple prominent right paratracheal nodes, largest 12 mm short axis. There scattered highest mediastinal nodes. Small prevascular nodes. Calcified left hilar nodes  consistent with prior granulomatous disease. Adjacent to the calcified left hilar node is a prominent 9 mm hilar node. Esophagus is patulous. No esophageal wall thickening. No thyroid nodule. Lungs/Pleura: Bilateral pleural nodularity and pleural plaques which have calcified since 2016. No frank pleural effusion. Patchy ground-glass opacities throughout the entire right lung and to a lesser extent left upper and lower lobe. Trace fissural thickening of the minor fissure. Trachea and mainstem bronchi are patent. No dominant pulmonary mass. Upper Abdomen: No acute findings. Musculoskeletal: Post median sternotomy. Stable bone island in lower thoracic vertebra. Remote left rib fractures. There are no acute or suspicious osseous abnormalities. Review of the MIP images confirms the above findings. IMPRESSION: 1. No pulmonary embolus. 2. Patchy ground-glass opacities throughout the right greater than left lung. Findings favor infection, including atypical viral organisms ( COVID-19). Other infectious or  non infectious etiologies are also considered, distribution is not typical of pulmonary edema. 3. Right hilar and mild mediastinal adenopathy. This is likely reactive, however nonspecific. Consider follow-up CT after resolution of symptoms. 4. Diffuse bilateral pleural nodularity which has progressively calcified since 2016. Findings most consistent with prior asbestos exposure. 5. Cardiomegaly. Trace contrast refluxes into the IVC suggesting elevated right heart pressures. 6.  Aortic Atherosclerosis (ICD10-I70.0). Electronically Signed   By: Keith Rake M.D.   On: 11/12/2019 23:29   DG Chest Port 1 View  Result Date: 11/12/2019 CLINICAL DATA:  Shortness of breath x7 weeks. EXAM: PORTABLE CHEST 1 VIEW COMPARISON:  December 04, 2018 FINDINGS: There are new multifocal airspace opacities bilaterally, greatest in the right mid lung zone. A multi lead left-sided pacemaker is noted with stable appearance of the leads.  The patient is status post prior median sternotomy. The inferior-most sternotomy wire is fractured as is the superior most sternotomy wire. The heart size is significantly enlarged. There are multiple old left-sided rib fractures. There is no pneumothorax. There are probable small bilateral pleural effusions. There is likely an old healed right clavicle fracture. IMPRESSION: 1. New multifocal airspace opacities bilaterally, greatest in the right mid lung zone. Findings are suspicious for multifocal pneumonia or developing pulmonary edema. 2. Probable small bilateral pleural effusions. 3. Stable cardiomegaly. Electronically Signed   By: Constance Holster M.D.   On: 11/12/2019 20:17        Scheduled Meds: . allopurinol  150 mg Oral Daily  . aspirin EC  81 mg Oral Daily  . atorvastatin  40 mg Oral Daily  . carvedilol  12.5 mg Oral Daily  . enoxaparin (LOVENOX) injection  40 mg Subcutaneous Q24H  . fluticasone  2 spray Each Nare Daily  . guaiFENesin  600 mg Oral BID  . pantoprazole  40 mg Oral Daily  . sodium chloride flush  3 mL Intravenous Once  . torsemide  20 mg Oral BID  . vitamin B-12  1,000 mcg Oral Daily   Continuous Infusions: . sodium chloride 100 mL/hr at 11/13/19 0318  . levofloxacin (LEVAQUIN) IV Stopped (11/13/19 0513)     LOS: 0 days    Time spent: 15 minutes    Edwin Dada, MD [facility] 11/13/2019, 8:32 AM     Please page though [place] or Epic secure chat:  For password, contact charge nurse

## 2019-11-13 NOTE — ED Notes (Signed)
Pt has removed oxygen and states he feels back to baseline with breathing. Pt stating he feels as though he could go home today.

## 2019-11-13 NOTE — ED Notes (Signed)
Pt is 86% on RA when RN entered room. Pt sitting up in bed attempting to put shirt on. Pt denies increased WOB but agreed to wear oxygen again after RN explained his low oxygenation.

## 2019-11-13 NOTE — ED Notes (Signed)
Pt ambulatory to the bathroom with a standby assist and reports he has already called to order his dinner for the night.

## 2019-11-13 NOTE — ED Notes (Signed)
Pt given meal tray and sitting on the side of bed feeding self. NAD at this time.

## 2019-11-13 NOTE — ED Notes (Signed)
Breakfast order placed ?

## 2019-11-13 NOTE — ED Notes (Signed)
Report given to Rachel RN

## 2019-11-14 DIAGNOSIS — I5022 Chronic systolic (congestive) heart failure: Secondary | ICD-10-CM

## 2019-11-14 DIAGNOSIS — J8489 Other specified interstitial pulmonary diseases: Secondary | ICD-10-CM

## 2019-11-14 LAB — RESPIRATORY PANEL BY PCR

## 2019-11-14 LAB — CBC
HCT: 44 % (ref 39.0–52.0)
Hemoglobin: 15.5 g/dL (ref 13.0–17.0)
MCH: 32.3 pg (ref 26.0–34.0)
MCHC: 35.2 g/dL (ref 30.0–36.0)
MCV: 91.7 fL (ref 80.0–100.0)
Platelets: 134 10*3/uL — ABNORMAL LOW (ref 150–400)
RBC: 4.8 MIL/uL (ref 4.22–5.81)
RDW: 14.9 % (ref 11.5–15.5)
WBC: 16.3 10*3/uL — ABNORMAL HIGH (ref 4.0–10.5)
nRBC: 0 % (ref 0.0–0.2)

## 2019-11-14 LAB — COMPREHENSIVE METABOLIC PANEL
ALT: 28 U/L (ref 0–44)
AST: 64 U/L — ABNORMAL HIGH (ref 15–41)
Albumin: 3 g/dL — ABNORMAL LOW (ref 3.5–5.0)
Alkaline Phosphatase: 86 U/L (ref 38–126)
Anion gap: 10 (ref 5–15)
BUN: 53 mg/dL — ABNORMAL HIGH (ref 8–23)
CO2: 27 mmol/L (ref 22–32)
Calcium: 8.4 mg/dL — ABNORMAL LOW (ref 8.9–10.3)
Chloride: 100 mmol/L (ref 98–111)
Creatinine, Ser: 1.8 mg/dL — ABNORMAL HIGH (ref 0.61–1.24)
GFR calc Af Amer: 41 mL/min — ABNORMAL LOW (ref >60)
GFR calc non Af Amer: 36 mL/min — ABNORMAL LOW (ref >60)
Glucose, Bld: 237 mg/dL — ABNORMAL HIGH (ref 70–99)
Potassium: 3.5 mmol/L (ref 3.5–5.1)
Sodium: 137 mmol/L (ref 135–145)
Total Bilirubin: 1 mg/dL (ref 0.3–1.2)
Total Protein: 7.2 g/dL (ref 6.5–8.1)

## 2019-11-14 LAB — SAR COV2 SEROLOGY (COVID19)AB(IGG),IA: SARS-CoV-2 Ab, IgG: REACTIVE — AB

## 2019-11-14 MED ORDER — SULFAMETHOXAZOLE-TRIMETHOPRIM 400-80 MG PO TABS: 1.0000 | ORAL_TABLET | Freq: Two times a day (BID) | ORAL | 0 refills | Status: DC

## 2019-11-14 MED ORDER — SULFAMETHOXAZOLE-TRIMETHOPRIM 400-80 MG PO TABS
1.0000 | ORAL_TABLET | Freq: Two times a day (BID) | ORAL | Status: DC
  Administered 2019-11-14: 1 via ORAL
  Filled 2019-11-14 (×2): qty 1

## 2019-11-14 MED ORDER — PREDNISONE 20 MG PO TABS: 40.0000 mg | ORAL_TABLET | Freq: Every day | ORAL | 0 refills | Status: DC

## 2019-11-14 NOTE — Progress Notes (Signed)
Patient had an uneventful shift this night. Patient repotred feeling better and ready to go home. Will endorse for day shift nurse to follow up.

## 2019-11-14 NOTE — Discharge Summary (Signed)
Physician Discharge Summary  Taylor [name] HYI:[id number] DOB: Sep 14, 1941 DOA: 11/12/2019  PCP: Taylor Sons, MD  Admit date: 11/12/2019 Discharge date: 11/14/2019  Admitted From: Home  Disposition:  Home   Recommendations for Outpatient Follow-up:  1. Follow up with PCP tomorrow 2. Follow up with Taylor [name] as soon as able 3. Please obtain CT chest after resolution of current symptoms to follow hilar adenopathy     Home Health: None  Equipment/Devices: O2  Discharge Condition: Good  CODE STATUS: FULL Diet recommendation: Cardiac  Brief/Interim Summary: Taylor [name] is a 78 y.o. M with CAD s/p CABG, CHF 20-25% ICD in place, CKD III, HTN, bradycardia with pacer, CKD IIIb baseline Cr 1.7 and psoriasis who presented with several weeks fatigue, progressive SOB and URI symptoms, no response to outpatient treatment with doxycycline.  Patient first developed symptoms in early November.  Fatigue first, followed by sore throat, cough.  Had PCP telemedicine apointment, took doxycycline 10 days, cough cleared up, but symptoms progressed to his sinuses.  Tested negative for COVID on 12/1.   Then had worsening headaches, sinus pressure for a few weeks, treated again with doxycycline, gradually sinus symptoms improved but then had progressive shortness of breath and so finally came to the ER.    In the ER, RR 28, SpO2 88% on room air.  COVID negative.  BNP 102.  Procal 0.25.   CTA showed no PE, mild reflux into IVC, but patchy asymmetric opacities primarily in the right lung.           PRINCIPAL HOSPITAL DIAGNOSIS: Acute on chronic hypoxic respiratory due to multifocal pneumonia    Discharge Diagnoses:   Acute vs chronic hypoxic respiratory failure due to multifocal pneumonia The patient presented with a progressive illness for 6-7 weeks, now with dyspnea and documented hypoxia at presentation here.  Had failed two rounds of outpatient doxycycline.  He has advanced heart  disease, but no symptoms of orthopnea, PND or swelling at all and so CHF doubted.  No prior history of lung disease.  He was treated with Levaquin and steroids and had improvement in symptoms but was still hypoxic to 85% with ambulation, O2 saturations normalized >88% with 2L supplemental O2.  Evaluated by pulmonology.  The differential includes atypical pneumonia, missed COVID, non-infectious pneumonia, sarcoidosis.  Discharged on Prednisone 40 mg daily, with expected prolonged taper to be arranged by Taylor [name], Bactrim BID for 1 week and close Pulmonology follow up    Chronic systolic CHF Echocardiogram optained, shows EF 20-25% no new valvular disease.  He had no symptoms of CHF, no swelilng on exam, BNP very low for sCHF, no effusions on CT, doubt this was fluid overload.  Continued home home regimen.    Chronic coronary artery disease Elevated troponin No chest pain.  Doubt CHF.  PE ruled out.   Doubt ACS.  No further ischemic work up necessary.  CKD IIIb Stable relative to baseline  Hilar adenopathy Likely reactive -Consider follow-up CT after resolution of symptoms.  Gout No active disease  GERD  Thrombocytopenia Mild, no bleeding           Discharge Instructions  Discharge Instructions    Discharge instructions   Complete by: As directed    From Dr. Loleta [name]: You were admitted for trouble breathing. We found that you have a pneumonia. Given you haven't responded completely to antibiotics, we suspect that this was either the left-over damage from a bad viral pneumonia (including possibly COVID).  You tested  negative for COVID here, although your blood test shows antibodies to COVID, suggesting that you had a past infection.  We do not think you are infectious with COVID at this time.    Follow up with Taylor [name] WITHIN 4 weeks.  His number is below, to call and make an appointment.  Let them know he saw you in the hospital, and needs to see you  within 4 weeks  Take Bactrim DS twice daily for 7 days (this an antibiotic that Taylor [name] recommended) Take prednisone 40 mg (two tabs) daily You will take the prednisone daily for at least a month, then start to taper, pending further work up by Taylor [name]  Resume your other home medicines without change  If you have any lingering cough, you should take the cough syrup we gave you here, Robitussin (with the ingredients "GUIAFENESIN" and "DEXTROMETHORPHAN")  Use the oxygen all the time until you see your primary care doctor. The purpose of the  oxygen is to keep your oxygen level at 88% or above Use a pulse oximeter to measure your oxygen level.  If the home health agency doesn't bring you one of these, you can find them at any drug store.   Increase activity slowly   Complete by: As directed      Allergies as of 11/14/2019      Reactions   Amlodipine Besylate Swelling   Had a reaction when taking with colcrys    Crestor [rosuvastatin]    Muscle cramps and pain   Rocephin [ceftriaxone]    unknown      Medication List    STOP taking these medications   brompheniramine-pseudoephedrine-DM 30-2-10 MG/5ML syrup   colchicine 0.6 MG tablet   doxycycline 100 MG tablet Commonly known as: VIBRA-TABS   mupirocin ointment 2 % Commonly known as: Bactroban   triamcinolone ointment 0.1 % Commonly known as: KENALOG   valsartan 160 MG tablet Commonly known as: DIOVAN     TAKE these medications   allopurinol 300 MG tablet Commonly known as: ZYLOPRIM Take 2 tablets (600 mg total) by mouth daily.   aspirin 81 MG EC tablet Take 81 mg by mouth daily.   atorvastatin 40 MG tablet Commonly known as: LIPITOR TAKE 1 TABLET BY MOUTH EVERY DAY   carvedilol 12.5 MG tablet Commonly known as: COREG TAKE 1 TABLET BY MOUTH DAILY   fluticasone 50 MCG/ACT nasal spray Commonly known as: FLONASE Place 2 sprays into both nostrils daily.   indomethacin 50 MG capsule Commonly known  as: INDOCIN Take 1 capsule (50 mg total) by mouth 3 (three) times daily as needed (for gout flares).   losartan 50 MG tablet Commonly known as: COZAAR TAKE 2 TABLETS(100 MG) BY MOUTH DAILY What changed: See the new instructions.   pantoprazole 40 MG tablet Commonly known as: PROTONIX TAKE 1 TABLET BY MOUTH EVERY DAY   sulfamethoxazole-trimethoprim 400-80 MG tablet Commonly known as: BACTRIM Take 1 tablet by mouth every 12 (twelve) hours.   torsemide 20 MG tablet Commonly known as: DEMADEX TAKE 2 TABLETS(40 MG) BY MOUTH DAILY What changed: See the new instructions.   Vitamin B-12 CR 1000 MCG Tbcr Take 1,000 mcg by mouth daily.            Durable Medical Equipment  (From admission, onward)         Start     Ordered   11/14/19 1539  DME Oxygen  Once    Question Answer Comment  Length of Need  6 Months   Mode or (Route) Nasal cannula   Liters per Minute 2   Frequency Continuous (stationary and portable oxygen unit needed)   Oxygen conserving device Yes   Oxygen delivery system Gas      11/14/19 1538          Allergies  Allergen Reactions  . Amlodipine Besylate Swelling    Had a reaction when taking with colcrys   . Crestor [Rosuvastatin]     Muscle cramps and pain  . Rocephin [Ceftriaxone]     unknown    Consultations:  Pulmonology   Procedures/Studies: CT Angio Chest PE W and/or Wo Contrast  Result Date: 11/12/2019 CLINICAL DATA:  Shortness of breath.  Dyspnea. EXAM: CT ANGIOGRAPHY CHEST WITH CONTRAST TECHNIQUE: Multidetector CT imaging of the chest was performed using the standard protocol during bolus administration of intravenous contrast. Multiplanar CT image reconstructions and MIPs were obtained to evaluate the vascular anatomy. CONTRAST:  24mL OMNIPAQUE IOHEXOL 350 MG/ML SOLN COMPARISON:  Radiograph earlier this day.  Chest CTA 07/13/2015 FINDINGS: Cardiovascular: There are no filling defects within the pulmonary arteries to suggest pulmonary  embolus. Aortic atherosclerosis. Cannot assess for dissection given phase of contrast tailored to pulmonary artery evaluation. Multi chamber cardiomegaly. Calcification of native coronary arteries, post CABG. Contrast refluxes minimally into the IVC. No pericardial effusion. Left-sided pacemaker in place with the leads in the right atrium and ventricle. Mediastinum/Nodes: Right hilar adenopathy with nodes measuring up to 18 mm short axis. Right subcarinal/infrahilar node measures 10 mm short axis. Multiple prominent right paratracheal nodes, largest 12 mm short axis. There scattered highest mediastinal nodes. Small prevascular nodes. Calcified left hilar nodes consistent with prior granulomatous disease. Adjacent to the calcified left hilar node is a prominent 9 mm hilar node. Esophagus is patulous. No esophageal wall thickening. No thyroid nodule. Lungs/Pleura: Bilateral pleural nodularity and pleural plaques which have calcified since 2016. No frank pleural effusion. Patchy ground-glass opacities throughout the entire right lung and to a lesser extent left upper and lower lobe. Trace fissural thickening of the minor fissure. Trachea and mainstem bronchi are patent. No dominant pulmonary mass. Upper Abdomen: No acute findings. Musculoskeletal: Post median sternotomy. Stable bone island in lower thoracic vertebra. Remote left rib fractures. There are no acute or suspicious osseous abnormalities. Review of the MIP images confirms the above findings. IMPRESSION: 1. No pulmonary embolus. 2. Patchy ground-glass opacities throughout the right greater than left lung. Findings favor infection, including atypical viral organisms ( COVID-19). Other infectious or non infectious etiologies are also considered, distribution is not typical of pulmonary edema. 3. Right hilar and mild mediastinal adenopathy. This is likely reactive, however nonspecific. Consider follow-up CT after resolution of symptoms. 4. Diffuse bilateral  pleural nodularity which has progressively calcified since 2016. Findings most consistent with prior asbestos exposure. 5. Cardiomegaly. Trace contrast refluxes into the IVC suggesting elevated right heart pressures. 6.  Aortic Atherosclerosis (ICD10-I70.0). Electronically Signed   By: Keith Rake M.D.   On: 11/12/2019 23:29   DG Chest Port 1 View  Result Date: 11/12/2019 CLINICAL DATA:  Shortness of breath x7 weeks. EXAM: PORTABLE CHEST 1 VIEW COMPARISON:  December 04, 2018 FINDINGS: There are new multifocal airspace opacities bilaterally, greatest in the right mid lung zone. A multi lead left-sided pacemaker is noted with stable appearance of the leads. The patient is status post prior median sternotomy. The inferior-most sternotomy wire is fractured as is the superior most sternotomy wire. The heart size is significantly enlarged. There are  multiple old left-sided rib fractures. There is no pneumothorax. There are probable small bilateral pleural effusions. There is likely an old healed right clavicle fracture. IMPRESSION: 1. New multifocal airspace opacities bilaterally, greatest in the right mid lung zone. Findings are suspicious for multifocal pneumonia or developing pulmonary edema. 2. Probable small bilateral pleural effusions. 3. Stable cardiomegaly. Electronically Signed   By: Constance Holster M.D.   On: 11/12/2019 20:17   ECHOCARDIOGRAM COMPLETE  Result Date: 11/13/2019   ECHOCARDIOGRAM REPORT   Patient Name:   OBBIE LEWALLEN Date of Exam: 11/13/2019 Medical Rec #:  [id number]    Height:       70.0 in Accession #:    [id number]   Weight:       209.8 lb Date of Birth:  [birth year]   BSA:          2.13 m Patient Age:    51 years     BP:           116/84 mmHg Patient Gender: M            HR:           59 bpm. Exam Location:  [facility] Procedure: 2D Echo, Cardiac Doppler and Color Doppler Indications:     Elevated Troponin  History:         Patient has prior history of Echocardiogram examinations,  most                  recent 05/07/2018. CHF; CAD. Loletha Grayer.  Sonographer:     Alyse Low Roar Referring Phys:  [id number] [place] Diagnosing Phys: Serafina Royals MD  Sonographer Comments: Technically difficult study due to poor echo windows. IMPRESSIONS  1. Left ventricular ejection fraction, by visual estimation, is 20 to 25%. The left ventricle has severely decreased function. There is no left ventricular hypertrophy.  2. Moderately dilated left ventricular internal cavity size.  3. The left ventricle demonstrates global hypokinesis.  4. Global right ventricle has normal systolic function.The right ventricular size is normal. No increase in right ventricular wall thickness.  5. Left atrial size was mildly dilated.  6. Right atrial size was mildly dilated.  7. The mitral valve is normal in structure. Moderate mitral valve regurgitation.  8. The tricuspid valve is normal in structure. Tricuspid valve regurgitation moderate.  9. The aortic valve is normal in structure. Aortic valve regurgitation is trivial. 10. The pulmonic valve was normal in structure. Pulmonic valve regurgitation is not visualized. 11. Mildly elevated pulmonary artery systolic pressure. FINDINGS  Left Ventricle: Left ventricular ejection fraction, by visual estimation, is 20 to 25%. The left ventricle has severely decreased function. The left ventricle demonstrates global hypokinesis. The left ventricular internal cavity size was moderately dilated left ventricle. There is no left ventricular hypertrophy. Right Ventricle: The right ventricular size is normal. No increase in right ventricular wall thickness. Global RV systolic function is has normal systolic function. The tricuspid regurgitant velocity is 2.24 m/s, and with an assumed right atrial pressure  of 10 mmHg, the estimated right ventricular systolic pressure is mildly elevated at 30.1 mmHg. Left Atrium: Left atrial size was mildly dilated. Right Atrium: Right atrial size was mildly dilated  Pericardium: There is no evidence of pericardial effusion. Mitral Valve: The mitral valve is normal in structure. Moderate mitral valve regurgitation. Tricuspid Valve: The tricuspid valve is normal in structure. Tricuspid valve regurgitation moderate. Aortic Valve: The aortic valve is normal in structure. Aortic valve regurgitation is trivial. Aortic valve  mean gradient measures 4.0 mmHg. Aortic valve peak gradient measures 7.5 mmHg. Aortic valve area, by VTI measures 2.13 cm. Pulmonic Valve: The pulmonic valve was normal in structure. Pulmonic valve regurgitation is not visualized. Pulmonic regurgitation is not visualized. Aorta: The aortic root and ascending aorta are structurally normal, with no evidence of dilitation. IAS/Shunts: No atrial level shunt detected by color flow Doppler.  LEFT VENTRICLE PLAX 2D LVIDd:         6.42 cm  Diastology LVIDs:         5.59 cm  LV e' lateral:   7.22 cm/s LV PW:         1.35 cm  LV E/e' lateral: 12.2 LV IVS:        1.20 cm  LV e' medial:    4.38 cm/s LVOT diam:     2.10 cm  LV E/e' medial:  20.2 LV SV:         57 ml LV SV Index:   25.99 LVOT Area:     3.46 cm  RIGHT VENTRICLE TAPSE (M-mode): 1.2 cm LEFT ATRIUM           Index       RIGHT ATRIUM           Index LA diam:      5.60 cm 2.63 cm/m  RA Area:     19.80 cm LA Vol (A4C): 74.4 ml 34.93 ml/m RA Volume:   53.90 ml  25.30 ml/m  AORTIC VALVE                   PULMONIC VALVE AV Area (Vmax):    1.93 cm    PV Vmax:        0.75 m/s AV Area (Vmean):   2.09 cm    PV Peak grad:   2.2 mmHg AV Area (VTI):     2.13 cm    RVOT Peak grad: 1 mmHg AV Vmax:           137.33 cm/s AV Vmean:          88.167 cm/s AV VTI:            0.268 m AV Peak Grad:      7.5 mmHg AV Mean Grad:      4.0 mmHg LVOT Vmax:         76.33 cm/s LVOT Vmean:        53.167 cm/s LVOT VTI:          0.164 m LVOT/AV VTI ratio: 0.61  AORTA Ao Root diam: 3.50 cm MITRAL VALVE                        TRICUSPID VALVE MV Area (PHT): 4.15 cm             TR Peak grad:    20.1 mmHg MV PHT:        53.07 msec           TR Vmax:        261.00 cm/s MV Decel Time: 183 msec MV E velocity: 88.30 cm/s 103 cm/s  SHUNTS MV A velocity: 64.50 cm/s 70.3 cm/s Systemic VTI:  0.16 m MV E/A ratio:  1.37       1.5       Systemic Diam: 2.10 cm  Serafina Royals MD Electronically signed by Serafina Royals MD Signature Date/Time: 11/13/2019/12:48:45 PM    Final       Subjective: No  dyspnea, fever, chest pain.  No sputum.  Mild cough.  No swelling, orthopnea.  Discharge Exam: Vitals:   11/14/19 1233 11/14/19 1536  BP:  140/82  Pulse:  63  Resp:  19  Temp:  97.8 F (36.6 C)  SpO2: (!) 88% 95%   Vitals:   11/14/19 1230 11/14/19 1232 11/14/19 1233 11/14/19 1536  BP:    140/82  Pulse:    63  Resp:    19  Temp:    97.8 F (36.6 C)  TempSrc:      SpO2: (!) 85% 93% (!) 88% 95%  Weight:      Height:        General: Pt is alert, awake, not in acute distress Cardiovascular: RRR, nl S1-S2, no murmurs appreciated.   No LE edema.   Respiratory: Normal respiratory rate and rhythm.  CTAB without rales or wheezes. Abdominal: Abdomen soft and non-tender.  No distension or HSM.   Neuro/Psych: Strength symmetric in upper and lower extremities.  Judgment and insight appear normal.   The results of significant diagnostics from this hospitalization (including imaging, microbiology, ancillary and laboratory) are listed below for reference.     Microbiology: Recent Results (from the past 240 hour(s))  Respiratory Panel by RT PCR (Flu A&B, Covid) - Nasopharyngeal Swab     Status: None   Collection Time: 11/12/19  8:35 PM   Specimen: Nasopharyngeal Swab  Result Value Ref Range Status   SARS Coronavirus 2 by RT PCR NEGATIVE NEGATIVE Final    Comment: (NOTE) SARS-CoV-2 target nucleic acids are NOT DETECTED. The SARS-CoV-2 RNA is generally detectable in upper respiratoy specimens during the acute phase of infection. The lowest concentration of SARS-CoV-2 viral copies this assay can  detect is 131 copies/mL. A negative result does not preclude SARS-Cov-2 infection and should not be used as the sole basis for treatment or other patient management decisions. A negative result may occur with  improper specimen collection/handling, submission of specimen other than nasopharyngeal swab, presence of viral mutation(s) within the areas targeted by this assay, and inadequate number of viral copies (<131 copies/mL). A negative result must be combined with clinical observations, patient history, and epidemiological information. The expected result is Negative. Fact Sheet for Patients:  PinkCheek.be Fact Sheet for Healthcare Providers:  GravelBags.it This test is not yet ap proved or cleared by the [place] FDA and  has been authorized for detection and/or diagnosis of SARS-CoV-2 by FDA under an Emergency Use Authorization (EUA). This EUA will remain  in effect (meaning this test can be used) for the duration of the COVID-19 declaration under Section 564(b)(1) of the Act, 21 U.S.C. section 360bbb-3(b)(1), unless the authorization is terminated or revoked sooner.    Influenza A by PCR NEGATIVE NEGATIVE Final   Influenza B by PCR NEGATIVE NEGATIVE Final    Comment: (NOTE) The Xpert Xpress SARS-CoV-2/FLU/RSV assay is intended as an aid in  the diagnosis of influenza from Nasopharyngeal swab specimens and  should not be used as a sole basis for treatment. Nasal washings and  aspirates are unacceptable for Xpert Xpress SARS-CoV-2/FLU/RSV  testing. Fact Sheet for Patients: PinkCheek.be Fact Sheet for Healthcare Providers: GravelBags.it This test is not yet approved or cleared by the [place] FDA and  has been authorized for detection and/or diagnosis of SARS-CoV-2 by  FDA under an Emergency Use Authorization (EUA). This EUA will remain  in effect (meaning  this test can be used) for the duration of the  Covid-19 declaration  under Section 564(b)(1) of the Act, 21  U.S.C. section 360bbb-3(b)(1), unless the authorization is  terminated or revoked. Performed at [facility], [address] [place], [place] [address]   Culture, blood (routine x 2)     Status: None (Preliminary result)   Collection Time: 11/13/19 12:04 AM   Specimen: BLOOD  Result Value Ref Range Status   Specimen Description BLOOD LEFT ANTECUBITAL  Final   Special Requests   Final    BOTTLES DRAWN AEROBIC AND ANAEROBIC Blood Culture results may not be optimal due to an excessive volume of blood received in culture bottles   Culture   Final    NO GROWTH 1 DAY Performed at [facility], [address] [place], [place] [address]    Report Status PENDING  Incomplete  Culture, blood (routine x 2)     Status: None (Preliminary result)   Collection Time: 11/13/19 12:06 AM   Specimen: BLOOD  Result Value Ref Range Status   Specimen Description BLOOD RIGHT HAND  Final   Special Requests   Final    BOTTLES DRAWN AEROBIC AND ANAEROBIC Blood Culture adequate volume   Culture   Final    NO GROWTH 1 DAY Performed at [facility], [address] [place], [place] [address]    Report Status PENDING  Incomplete     Labs: BNP (last 3 results) Recent Labs    11/12/19 1934  BNP 494.4*   Basic Metabolic Panel: Recent Labs  Lab 11/12/19 1934 11/13/19 0701 11/14/19 0522  NA 138 137 137  K 3.4* 3.4* 3.5  CL 96* 99 100  CO2 29 25 27   GLUCOSE 146* 194* 237*  BUN 39* 42* 53*  CREATININE 1.87* 1.66* 1.80*  CALCIUM 8.4* 8.2* 8.4*  MG  --  1.5*  --    Liver Function Tests: Recent Labs  Lab 11/12/19 1934 11/14/19 0522  AST 79* 64*  ALT 32 28  ALKPHOS 87 86  BILITOT 1.7* 1.0  PROT 8.0 7.2  ALBUMIN 3.6 3.0*   No results for input(s): LIPASE, AMYLASE in the last 168 hours. No results for input(s): AMMONIA in the last 168  hours. CBC: Recent Labs  Lab 11/12/19 1934 11/13/19 0701 11/14/19 0522  WBC 6.5 4.0 16.3*  HGB 15.9 15.0 15.5  HCT 47.5 44.0 44.0  MCV 96.5 95.7 91.7  PLT 123* 109* 134*   Cardiac Enzymes: No results for input(s): CKTOTAL, CKMB, CKMBINDEX, TROPONINI in the last 168 hours. BNP: Invalid input(s): POCBNP CBG: No results for input(s): GLUCAP in the last 168 hours. D-Dimer No results for input(s): DDIMER in the last 72 hours. Hgb A1c No results for input(s): HGBA1C in the last 72 hours. Lipid Profile Recent Labs    11/13/19 0035  TRIG 82   Thyroid function studies No results for input(s): TSH, T4TOTAL, T3FREE, THYROIDAB in the last 72 hours.  Invalid input(s): FREET3 Anemia work up Recent Labs    11/13/19 0035  FERRITIN 492*   Urinalysis    Component Value Date/Time   BILIRUBINUR negative 03/31/2018 1452   PROTEINUR Negative 03/31/2018 1452   UROBILINOGEN 0.2 03/31/2018 1452   NITRITE Negative 03/31/2018 1452   LEUKOCYTESUR Negative 03/31/2018 1452   Sepsis Labs Invalid input(s): PROCALCITONIN,  WBC,  LACTICIDVEN Microbiology Recent Results (from the past 240 hour(s))  Respiratory Panel by RT PCR (Flu A&B, Covid) - Nasopharyngeal Swab     Status: None   Collection Time: 11/12/19  8:35 PM   Specimen: Nasopharyngeal  Swab  Result Value Ref Range Status   SARS Coronavirus 2 by RT PCR NEGATIVE NEGATIVE Final    Comment: (NOTE) SARS-CoV-2 target nucleic acids are NOT DETECTED. The SARS-CoV-2 RNA is generally detectable in upper respiratoy specimens during the acute phase of infection. The lowest concentration of SARS-CoV-2 viral copies this assay can detect is 131 copies/mL. A negative result does not preclude SARS-Cov-2 infection and should not be used as the sole basis for treatment or other patient management decisions. A negative result may occur with  improper specimen collection/handling, submission of specimen other than nasopharyngeal swab, presence of  viral mutation(s) within the areas targeted by this assay, and inadequate number of viral copies (<131 copies/mL). A negative result must be combined with clinical observations, patient history, and epidemiological information. The expected result is Negative. Fact Sheet for Patients:  PinkCheek.be Fact Sheet for Healthcare Providers:  GravelBags.it This test is not yet ap proved or cleared by the [place] FDA and  has been authorized for detection and/or diagnosis of SARS-CoV-2 by FDA under an Emergency Use Authorization (EUA). This EUA will remain  in effect (meaning this test can be used) for the duration of the COVID-19 declaration under Section 564(b)(1) of the Act, 21 U.S.C. section 360bbb-3(b)(1), unless the authorization is terminated or revoked sooner.    Influenza A by PCR NEGATIVE NEGATIVE Final   Influenza B by PCR NEGATIVE NEGATIVE Final    Comment: (NOTE) The Xpert Xpress SARS-CoV-2/FLU/RSV assay is intended as an aid in  the diagnosis of influenza from Nasopharyngeal swab specimens and  should not be used as a sole basis for treatment. Nasal washings and  aspirates are unacceptable for Xpert Xpress SARS-CoV-2/FLU/RSV  testing. Fact Sheet for Patients: PinkCheek.be Fact Sheet for Healthcare Providers: GravelBags.it This test is not yet approved or cleared by the [place] FDA and  has been authorized for detection and/or diagnosis of SARS-CoV-2 by  FDA under an Emergency Use Authorization (EUA). This EUA will remain  in effect (meaning this test can be used) for the duration of the  Covid-19 declaration under Section 564(b)(1) of the Act, 21  U.S.C. section 360bbb-3(b)(1), unless the authorization is  terminated or revoked. Performed at [facility], [address] [place], [place] [address]   Culture, blood (routine x 2)      Status: None (Preliminary result)   Collection Time: 11/13/19 12:04 AM   Specimen: BLOOD  Result Value Ref Range Status   Specimen Description BLOOD LEFT ANTECUBITAL  Final   Special Requests   Final    BOTTLES DRAWN AEROBIC AND ANAEROBIC Blood Culture results may not be optimal due to an excessive volume of blood received in culture bottles   Culture   Final    NO GROWTH 1 DAY Performed at [facility], [address], [place], [place] [address]    Report Status PENDING  Incomplete  Culture, blood (routine x 2)     Status: None (Preliminary result)   Collection Time: 11/13/19 12:06 AM   Specimen: BLOOD  Result Value Ref Range Status   Specimen Description BLOOD RIGHT HAND  Final   Special Requests   Final    BOTTLES DRAWN AEROBIC AND ANAEROBIC Blood Culture adequate volume   Culture   Final    NO GROWTH 1 DAY Performed at [facility], [address] [place], [place] [address]    Report Status PENDING  Incomplete     Time coordinating discharge: 35 minutes  SIGNED:   Edwin Dada, MD  [facility] 11/14/2019, 3:52 PM

## 2019-11-14 NOTE — Progress Notes (Addendum)
SATURATION QUALIFICATIONS: (This note is used to comply with regulatory documentation for home oxygen)  Patient Saturations on Room Air at Rest = 89%  Patient Saturations on Room Air while Ambulating = 85%  Patient Saturations on 1.5 Liters of oxygen while Ambulating = 91%

## 2019-11-14 NOTE — Consult Note (Signed)
Pulmonary Medicine          Date: 11/14/2019,   MRN# [id number] Taylor [name] 09/29/1941     AdmissionWeight: 95.2 kg                 CurrentWeight: 95 kg  Referring physician: Dr. Loleta Books    CHIEF COMPLAINT:   Atypical pneumonia   HISTORY OF PRESENT ILLNESS   This is a pleasant 78 year old male with a history of AAA, systolic CHF with EF 00-86 status post PPM AICD, GERD with Barrett's esophagus, bladder CA, recurrent bradycardia, coronary artery disease, chronic cluster headaches, chronic dyspnea on exertion with lower extremity edema, gout, dyslipidemia, essential hypertension, malignant melanoma, osteoarthritis, pneumonia in 2016, recurrent falls, sleep apnea currently on CPAP, history of CVA, significant surgical history as below, came into the emergency room due to complaints of worsening dyspnea progressively over the past 7 weeks, this was associated with intermittent cough which was initially productive with yellowish expectorant.  He had been seen by primary care doctor in the past and on an urgent clinic and was prescribed doxycycline twice daily x10 days each time.  In the emergency room he did have mild hypoxemia and tachypnea with an SPO2 of 88% minimum, his cardiac biomarkers were unimpressive however he did have prerenal azotemia with creatinine of 1.87 on admission.  Covid testing was negative on admission.  He had a CTA PE protocol done which was negative for venous pulmonary thromboembolism however did show peripheral groundglass opacities with atoll sign at the right peripheral lung zone suggestive of organizing pneumonia. During evaluation today patient speaking in full sentences on 1-2L/min [place], he reports clinical improvement, he admits to dizziness which is chronic comes and goes but worse this time. He states he has appt with Dr [name] Section tommorow and will discuss this further.    PAST MEDICAL HISTORY   Past Medical History:  Diagnosis Date  . AAA  (abdominal aortic aneurysm) [facility]) 06/03/2007   [facility]; Dr. Kellie Simmering  . AICD (automatic cardioverter/defibrillator) present   . Barrett's esophagus   . Bladder cancer ([place])   . Bradycardia   . CAD (coronary artery disease)   . CHF (congestive heart failure) ([place])   . Cluster headache   . DDD (degenerative disc disease), lumbar   . Dyspnea    WITH EXERTION  . Edema    LEFT ANKLE  . Fracture of skull base ([place]) 1997   due to fall  . GERD (gastroesophageal reflux disease)   . Gout   . History of bladder cancer 12/1995  . Hyperlipidemia   . Hypertension   . Malignant melanoma ([place]) 12/2012   right dorsal forearm excised  . Myocardial infarction ([place])    LAST 2014  . Osteoarthritis of knee   . Pacemaker 10/10/2006  . Pneumonia    2016  . Pre-diabetes   . Psoriasis   . Rib fracture 1997   due to fall  . Sleep apnea    CPAP  . Stroke ([place])   . Venous incompetence      SURGICAL HISTORY   Past Surgical History:  Procedure Laterality Date  . ABDOMINAL AORTIC ANEURYSM REPAIR  06/03/2007   [facility]; Dr. Kellie Simmering  . ANGIOPLASTY  1994   MI  . BLADDER TUMOR EXCISION  12/1995  . CATARACT EXTRACTION W/PHACO Left 10/22/2016   Procedure: CATARACT EXTRACTION PHACO AND INTRAOCULAR LENS PLACEMENT (IOC);  Surgeon: Birder Robson, MD;  Location: [facility] ORS;  Service: Ophthalmology;  Laterality: Left;  [place] 47.7 AP% 18.4 CDE 8.78 Fluid pack lot # [id number]  . CATARACT EXTRACTION W/PHACO Right 12/10/2016   Procedure: CATARACT EXTRACTION PHACO AND INTRAOCULAR LENS PLACEMENT (IOC);  Surgeon: Birder Robson, MD;  Location: [facility] ORS;  Service: Ophthalmology;  Laterality: Right;  [place] 00:39 AP% 23.3 CDE 9.13 Fluid pack lot # [id number]  . CORONARY ANGIOPLASTY     STENTS X 5  . CORONARY ARTERY BYPASS GRAFT  09/22/2006   four  . ELBOW BURSA SURGERY     DUE TO GOUT  . INSERT / REPLACE / REMOVE PACEMAKER    . MELANOMA EXCISION  12/2012   Right forearm      FAMILY HISTORY   Family History  Problem Relation Age of Onset  . Cancer Mother        Melanoma skin cancer  . Heart attack Father 59  . Cancer Father        throat cancer  . Arthritis Brother      SOCIAL HISTORY   Social History   Tobacco Use  . Smoking status: Former Smoker    Packs/day: 1.00    Types: Cigarettes    Quit date: 11/26/1999    Years since quitting: 19.9  . Smokeless tobacco: Never Used  Substance Use Topics  . Alcohol use: No  . Drug use: No     MEDICATIONS    Home Medication:    Current Medication:  Current Facility-Administered Medications:  .  acetaminophen (TYLENOL) tablet 650 mg, 650 mg, Oral, Q6H PRN **OR** acetaminophen (TYLENOL) suppository 650 mg, 650 mg, Rectal, Q6H PRN, Mansy, Jan A, MD .  allopurinol (ZYLOPRIM) tablet 150 mg, 150 mg, Oral, Daily, Mansy, Jan A, MD, 150 mg at 11/13/19 1236 .  aspirin EC tablet 81 mg, 81 mg, Oral, Daily, Mansy, Jan A, MD, 81 mg at 11/13/19 1239 .  atorvastatin (LIPITOR) tablet 40 mg, 40 mg, Oral, Daily, Mansy, Jan A, MD, 40 mg at 11/13/19 1832 .  carvedilol (COREG) tablet 12.5 mg, 12.5 mg, Oral, Daily, Mansy, Jan A, MD, 12.5 mg at 11/13/19 1237 .  chlorpheniramine-HYDROcodone (TUSSIONEX) 10-8 MG/5ML suspension 5 mL, 5 mL, Oral, Q12H PRN, Mansy, Jan A, MD .  enoxaparin (LOVENOX) injection 40 mg, 40 mg, Subcutaneous, Q24H, Mansy, Jan A, MD, 40 mg at 11/14/19 0330 .  fluticasone (FLONASE) 50 MCG/ACT nasal spray 2 spray, 2 spray, Each Nare, Daily, Mansy, Jan A, MD, 2 spray at 11/13/19 1240 .  guaiFENesin (MUCINEX) 12 hr tablet 600 mg, 600 mg, Oral, BID, Mansy, Jan A, MD, 600 mg at 11/13/19 2140 .  hydrALAZINE (APRESOLINE) tablet 25 mg, 25 mg, Oral, Q6H PRN, Mansy, Jan A, MD .  levofloxacin (LEVAQUIN) IVPB 750 mg, 750 mg, Intravenous, Q48H, Mansy, Arvella Merles, MD, Stopped at 11/13/19 [id number] .  magnesium hydroxide (MILK OF MAGNESIA) suspension 30 mL, 30 mL, Oral, Daily PRN, Mansy, Jan A, MD .  methylPREDNISolone  sodium succinate (SOLU-MEDROL) 40 mg/mL injection 40 mg, 40 mg, Intravenous, Daily, Danford, Suann Larry, MD, 40 mg at 11/13/19 1425 .  ondansetron (ZOFRAN) tablet 4 mg, 4 mg, Oral, Q6H PRN **OR** ondansetron (ZOFRAN) injection 4 mg, 4 mg, Intravenous, Q6H PRN, Mansy, Jan A, MD .  pantoprazole (PROTONIX) EC tablet 40 mg, 40 mg, Oral, Daily, Mansy, Jan A, MD, 40 mg at 11/13/19 1237 .  sodium chloride flush (NS) 0.9 % injection 3 mL, 3 mL, Intravenous, Once, Earleen Newport, MD .  torsemide [facility]) tablet 20 mg, 20 mg,  Oral, BID, Mansy, Jan A, MD, 20 mg at 11/13/19 1237 .  traZODone (DESYREL) tablet 25 mg, 25 mg, Oral, QHS PRN, Mansy, Jan A, MD, 25 mg at 11/14/19 0145 .  vitamin B-12 (CYANOCOBALAMIN) tablet 1,000 mcg, 1,000 mcg, Oral, Daily, Mansy, Jan A, MD, 1,000 mcg at 11/13/19 1238    ALLERGIES   Amlodipine besylate, Crestor [rosuvastatin], and Rocephin [ceftriaxone]     REVIEW OF SYSTEMS    Review of Systems:  Gen:  Denies  fever, sweats, chills weigh loss  HEENT: Denies blurred vision, double vision, ear pain, eye pain, hearing loss, nose bleeds, sore throat Cardiac:  No dizziness, chest pain or heaviness, chest tightness,edema Resp:   Denies cough or sputum porduction, shortness of breath,wheezing, hemoptysis,  Gi: Denies swallowing difficulty, stomach pain, nausea or vomiting, diarrhea, constipation, bowel incontinence Gu:  Denies bladder incontinence, burning urine Ext:   Denies Joint pain, stiffness or swelling Skin: Denies  skin rash, easy bruising or bleeding or hives Endoc:  Denies polyuria, polydipsia , polyphagia or weight change Psych:   Denies depression, insomnia or hallucinations   Other:  All other systems negative   VS: BP (!) 160/96 (BP Location: Right Arm)   Pulse 66   Temp (!) 97.5 F (36.4 C) (Oral)   Resp 19   Ht 5\' 10"  (1.778 m)   Wt 95 kg   SpO2 96%   BMI 30.06 kg/m      PHYSICAL EXAM    GENERAL:NAD, no fevers, chills, no weakness  no fatigue HEAD: Normocephalic, atraumatic.  EYES: Pupils equal, round, reactive to light. Extraocular muscles intact. No scleral icterus.  MOUTH: Moist mucosal membrane. Dentition intact. No abscess noted.  EAR, NOSE, THROAT: Clear without exudates. No external lesions.  NECK: Supple. No thyromegaly. No nodules. No JVD.  PULMONARY:  Mild rhonchi bilaterally  CARDIOVASCULAR: S1 and S2. Regular rate and rhythm. No murmurs, rubs, or gallops. No edema. Pedal pulses 2+ bilaterally.  GASTROINTESTINAL: Soft, nontender, nondistended. No masses. Positive bowel sounds. No hepatosplenomegaly.  MUSCULOSKELETAL: No swelling, clubbing, or edema. Range of motion full in all extremities.  NEUROLOGIC: Cranial nerves II through XII are intact. No gross focal neurological deficits. Sensation intact. Reflexes intact.  SKIN: No ulceration, lesions, rashes, or cyanosis. Skin warm and dry. Turgor intact.  PSYCHIATRIC: Mood, affect within normal limits. The patient is awake, alert and oriented x 3. Insight, judgment intact.       IMAGING    CT Angio Chest PE W and/or Wo Contrast  Result Date: 11/12/2019 CLINICAL DATA:  Shortness of breath.  Dyspnea. EXAM: CT ANGIOGRAPHY CHEST WITH CONTRAST TECHNIQUE: Multidetector CT imaging of the chest was performed using the standard protocol during bolus administration of intravenous contrast. Multiplanar CT image reconstructions and MIPs were obtained to evaluate the vascular anatomy. CONTRAST:  20mL OMNIPAQUE IOHEXOL 350 MG/ML SOLN COMPARISON:  Radiograph earlier this day.  Chest CTA 07/13/2015 FINDINGS: Cardiovascular: There are no filling defects within the pulmonary arteries to suggest pulmonary embolus. Aortic atherosclerosis. Cannot assess for dissection given phase of contrast tailored to pulmonary artery evaluation. Multi chamber cardiomegaly. Calcification of native coronary arteries, post CABG. Contrast refluxes minimally into the IVC. No pericardial effusion.  Left-sided pacemaker in place with the leads in the right atrium and ventricle. Mediastinum/Nodes: Right hilar adenopathy with nodes measuring up to 18 mm short axis. Right subcarinal/infrahilar node measures 10 mm short axis. Multiple prominent right paratracheal nodes, largest 12 mm short axis. There scattered highest mediastinal nodes. Small prevascular nodes. Calcified  left hilar nodes consistent with prior granulomatous disease. Adjacent to the calcified left hilar node is a prominent 9 mm hilar node. Esophagus is patulous. No esophageal wall thickening. No thyroid nodule. Lungs/Pleura: Bilateral pleural nodularity and pleural plaques which have calcified since 2016. No frank pleural effusion. Patchy ground-glass opacities throughout the entire right lung and to a lesser extent left upper and lower lobe. Trace fissural thickening of the minor fissure. Trachea and mainstem bronchi are patent. No dominant pulmonary mass. Upper Abdomen: No acute findings. Musculoskeletal: Post median sternotomy. Stable bone island in lower thoracic vertebra. Remote left rib fractures. There are no acute or suspicious osseous abnormalities. Review of the MIP images confirms the above findings. IMPRESSION: 1. No pulmonary embolus. 2. Patchy ground-glass opacities throughout the right greater than left lung. Findings favor infection, including atypical viral organisms ( COVID-19). Other infectious or non infectious etiologies are also considered, distribution is not typical of pulmonary edema. 3. Right hilar and mild mediastinal adenopathy. This is likely reactive, however nonspecific. Consider follow-up CT after resolution of symptoms. 4. Diffuse bilateral pleural nodularity which has progressively calcified since 2016. Findings most consistent with prior asbestos exposure. 5. Cardiomegaly. Trace contrast refluxes into the IVC suggesting elevated right heart pressures. 6.  Aortic Atherosclerosis (ICD10-I70.0). Electronically Signed    By: Keith Rake M.D.   On: 11/12/2019 23:29   DG Chest Port 1 View  Result Date: 11/12/2019 CLINICAL DATA:  Shortness of breath x7 weeks. EXAM: PORTABLE CHEST 1 VIEW COMPARISON:  December 04, 2018 FINDINGS: There are new multifocal airspace opacities bilaterally, greatest in the right mid lung zone. A multi lead left-sided pacemaker is noted with stable appearance of the leads. The patient is status post prior median sternotomy. The inferior-most sternotomy wire is fractured as is the superior most sternotomy wire. The heart size is significantly enlarged. There are multiple old left-sided rib fractures. There is no pneumothorax. There are probable small bilateral pleural effusions. There is likely an old healed right clavicle fracture. IMPRESSION: 1. New multifocal airspace opacities bilaterally, greatest in the right mid lung zone. Findings are suspicious for multifocal pneumonia or developing pulmonary edema. 2. Probable small bilateral pleural effusions. 3. Stable cardiomegaly. Electronically Signed   By: Constance Holster M.D.   On: 11/12/2019 20:17   ECHOCARDIOGRAM COMPLETE  Result Date: 11/13/2019   ECHOCARDIOGRAM REPORT   Patient Name:   ADONI GREENOUGH Date of Exam: 11/13/2019 Medical Rec #:  [id number]    Height:       70.0 in Accession #:    [id number]   Weight:       209.8 lb Date of Birth:  10-Jul-1941   BSA:          2.13 m Patient Age:    26 years     BP:           116/84 mmHg Patient Gender: M            HR:           59 bpm. Exam Location:  [facility] Procedure: 2D Echo, Cardiac Doppler and Color Doppler Indications:     Elevated Troponin  History:         Patient has prior history of Echocardiogram examinations, most                  recent 05/07/2018. CHF; CAD. Loletha Grayer.  Sonographer:     Alyse Low Roar Referring Phys:  [id number] [place] Diagnosing Phys: Serafina Royals MD  Sonographer  Comments: Technically difficult study due to poor echo windows. IMPRESSIONS  1. Left ventricular ejection  fraction, by visual estimation, is 20 to 25%. The left ventricle has severely decreased function. There is no left ventricular hypertrophy.  2. Moderately dilated left ventricular internal cavity size.  3. The left ventricle demonstrates global hypokinesis.  4. Global right ventricle has normal systolic function.The right ventricular size is normal. No increase in right ventricular wall thickness.  5. Left atrial size was mildly dilated.  6. Right atrial size was mildly dilated.  7. The mitral valve is normal in structure. Moderate mitral valve regurgitation.  8. The tricuspid valve is normal in structure. Tricuspid valve regurgitation moderate.  9. The aortic valve is normal in structure. Aortic valve regurgitation is trivial. 10. The pulmonic valve was normal in structure. Pulmonic valve regurgitation is not visualized. 11. Mildly elevated pulmonary artery systolic pressure. FINDINGS  Left Ventricle: Left ventricular ejection fraction, by visual estimation, is 20 to 25%. The left ventricle has severely decreased function. The left ventricle demonstrates global hypokinesis. The left ventricular internal cavity size was moderately dilated left ventricle. There is no left ventricular hypertrophy. Right Ventricle: The right ventricular size is normal. No increase in right ventricular wall thickness. Global RV systolic function is has normal systolic function. The tricuspid regurgitant velocity is 2.24 m/s, and with an assumed right atrial pressure  of 10 mmHg, the estimated right ventricular systolic pressure is mildly elevated at 30.1 mmHg. Left Atrium: Left atrial size was mildly dilated. Right Atrium: Right atrial size was mildly dilated Pericardium: There is no evidence of pericardial effusion. Mitral Valve: The mitral valve is normal in structure. Moderate mitral valve regurgitation. Tricuspid Valve: The tricuspid valve is normal in structure. Tricuspid valve regurgitation moderate. Aortic Valve: The aortic valve  is normal in structure. Aortic valve regurgitation is trivial. Aortic valve mean gradient measures 4.0 mmHg. Aortic valve peak gradient measures 7.5 mmHg. Aortic valve area, by VTI measures 2.13 cm. Pulmonic Valve: The pulmonic valve was normal in structure. Pulmonic valve regurgitation is not visualized. Pulmonic regurgitation is not visualized. Aorta: The aortic root and ascending aorta are structurally normal, with no evidence of dilitation. IAS/Shunts: No atrial level shunt detected by color flow Doppler.  LEFT VENTRICLE PLAX 2D LVIDd:         6.42 cm  Diastology LVIDs:         5.59 cm  LV e' lateral:   7.22 cm/s LV PW:         1.35 cm  LV E/e' lateral: 12.2 LV IVS:        1.20 cm  LV e' medial:    4.38 cm/s LVOT diam:     2.10 cm  LV E/e' medial:  20.2 LV SV:         57 ml LV SV Index:   25.99 LVOT Area:     3.46 cm  RIGHT VENTRICLE TAPSE (M-mode): 1.2 cm LEFT ATRIUM           Index       RIGHT ATRIUM           Index LA diam:      5.60 cm 2.63 cm/m  RA Area:     19.80 cm LA Vol (A4C): 74.4 ml 34.93 ml/m RA Volume:   53.90 ml  25.30 ml/m  AORTIC VALVE                   PULMONIC VALVE AV Area (Vmax):  1.93 cm    PV Vmax:        0.75 m/s AV Area (Vmean):   2.09 cm    PV Peak grad:   2.2 mmHg AV Area (VTI):     2.13 cm    RVOT Peak grad: 1 mmHg AV Vmax:           137.33 cm/s AV Vmean:          88.167 cm/s AV VTI:            0.268 m AV Peak Grad:      7.5 mmHg AV Mean Grad:      4.0 mmHg LVOT Vmax:         76.33 cm/s LVOT Vmean:        53.167 cm/s LVOT VTI:          0.164 m LVOT/AV VTI ratio: 0.61  AORTA Ao Root diam: 3.50 cm MITRAL VALVE                        TRICUSPID VALVE MV Area (PHT): 4.15 cm             TR Peak grad:   20.1 mmHg MV PHT:        53.07 msec           TR Vmax:        261.00 cm/s MV Decel Time: 183 msec MV E velocity: 88.30 cm/s 103 cm/s  SHUNTS MV A velocity: 64.50 cm/s 70.3 cm/s Systemic VTI:  0.16 m MV E/A ratio:  1.37       1.5       Systemic Diam: 2.10 cm  Serafina Royals MD  Electronically signed by Serafina Royals MD Signature Date/Time: 11/13/2019/12:48:45 PM    Final       ASSESSMENT/PLAN   Atypical pneumonia with BOOP   -Due to chronicity of symptoms will obtain Covid serology antibodies  -Respiratory viral panel  -Agree with Solu-Medrol, will likely need several months for complete resolution due to formation of organizing pneumonia  -Patient may need flexible bronchoscopy with bronchoalveolar lavage which we can discuss on outpatient basis once in chronic stable state -Peripheral infiltrates are inconsistent with pulmonary edema from CHF -Recommend net even fluid status while acutely ill, caution with use of torsemide in gout patient.  -PT OT and bronchopulmonary hygiene-can use incentive spirometer and flutter valve no need for MetaNeb or vest therapy at this time. -51mwt for O2 qualification -Patient will need a prolonged prednisone taper and PCP prophylaxis, will switch antibiotics to Bactrim twice daily     Thank you Dr. Loleta Books for allowing me to participate in the care of this patient.   Patient/Family are satisfied with care plan and all questions have been answered.  This document was prepared using Dragon voice recognition software and may include unintentional dictation errors.     Ottie Glazier, M.D.  Division of [place]

## 2019-11-15 ENCOUNTER — Encounter: Payer: Self-pay | Admitting: Family Medicine

## 2019-11-15 ENCOUNTER — Ambulatory Visit: Payer: Medicare Other | Admitting: Family Medicine

## 2019-11-15 ENCOUNTER — Ambulatory Visit (INDEPENDENT_AMBULATORY_CARE_PROVIDER_SITE_OTHER): Payer: Medicare Other | Admitting: Family Medicine

## 2019-11-15 DIAGNOSIS — J984 Other disorders of lung: Secondary | ICD-10-CM | POA: Diagnosis not present

## 2019-11-15 DIAGNOSIS — Z0184 Encounter for antibody response examination: Secondary | ICD-10-CM | POA: Diagnosis not present

## 2019-11-15 DIAGNOSIS — J189 Pneumonia, unspecified organism: Secondary | ICD-10-CM | POA: Diagnosis not present

## 2019-11-15 DIAGNOSIS — Z9981 Dependence on supplemental oxygen: Secondary | ICD-10-CM | POA: Diagnosis not present

## 2019-11-15 DIAGNOSIS — R768 Other specified abnormal immunological findings in serum: Secondary | ICD-10-CM

## 2019-11-15 NOTE — Progress Notes (Signed)
Patient: Taylor [name] Male    DOB: [birth year]   78 y.o.   MRN: [id number] Visit Date: 11/15/2019  Today's Provider: Lelon Huh, MD   Chief Complaint  Patient presents with  . Hospitalization Follow-up   Subjective:    Virtual Visit via Telephone Note  I connected with Taylor [name] on 11/15/19 at  8:20 AM EST by telephone and verified that I am speaking with the correct person using two identifiers.  Location: Patient: home Provider: [facility]   I discussed the limitations, risks, security and privacy concerns of performing an evaluation and management service by telephone and the availability of in person appointments. I also discussed with the patient that there may be a patient responsible charge related to this service. The patient expressed understanding and agreed to proceed.  HPI  Follow up Hospitalization  Patient was admitted to [facility] on 11/12/2019 and discharged on 11/14/2019. He was treated for Hypoxia, CAP, and SOB.  Treatment for this included started taking predniSONE (DELTASONE) and sulfamethoxazole-trimethoprim (BACTRIM). Telephone follow up was not done.  He reports good compliance with treatment. He reports this condition is Improved, but still experiencing lightheadedness, fatigue, and SOB. Has not picked up oximeter. His wife is going to pharmacy to pick up prescription for antibiotic and prednisone.   On 2.0 lpm oxygen  He was also advised to schedule follow up with pulmonologist Dr. Lanney Gins in 2-4 weeks and given contact information to schedule.  He was found to have antibiotic for Covid, although he had negative Covid NP swab on 12-18 and 10-26-2019  ------------------------------------------------------------------------------------  Allergies  Allergen Reactions  . Amlodipine Besylate Swelling    Had a reaction when taking with colcrys   . Crestor [Rosuvastatin]     Muscle cramps and pain  . Rocephin [Ceftriaxone]     unknown       Current Outpatient Medications:  .  allopurinol (ZYLOPRIM) 300 MG tablet, Take 2 tablets (600 mg total) by mouth daily., Disp: 60 tablet, Rfl: 12 .  aspirin 81 MG EC tablet, Take 81 mg by mouth daily. , Disp: , Rfl:  .  atorvastatin (LIPITOR) 40 MG tablet, TAKE 1 TABLET BY MOUTH EVERY DAY, Disp: 90 tablet, Rfl: 4 .  carvedilol (COREG) 12.5 MG tablet, TAKE 1 TABLET BY MOUTH DAILY, Disp: 90 tablet, Rfl: 4 .  Cyanocobalamin (VITAMIN B-12 CR) 1000 MCG TBCR, Take 1,000 mcg by mouth daily. , Disp: , Rfl:  .  fluticasone (FLONASE) 50 MCG/ACT nasal spray, Place 2 sprays into both nostrils daily., Disp: 16 g, Rfl: 0 .  indomethacin (INDOCIN) 50 MG capsule, Take 1 capsule (50 mg total) by mouth 3 (three) times daily as needed (for gout flares)., Disp: 30 capsule, Rfl: 1 .  losartan (COZAAR) 50 MG tablet, TAKE 2 TABLETS(100 MG) BY MOUTH DAILY (Patient taking differently: Take 50 mg by mouth daily. ), Disp: 90 tablet, Rfl: 4 .  pantoprazole (PROTONIX) 40 MG tablet, TAKE 1 TABLET BY MOUTH EVERY DAY, Disp: 90 tablet, Rfl: 4 .  predniSONE (DELTASONE) 20 MG tablet, Take 2 tablets (40 mg total) by mouth daily with breakfast., Disp: 60 tablet, Rfl: 0 .  sulfamethoxazole-trimethoprim (BACTRIM) 400-80 MG tablet, Take 1 tablet by mouth every 12 (twelve) hours., Disp: 13 tablet, Rfl: 0 .  torsemide (DEMADEX) 20 MG tablet, TAKE 2 TABLETS(40 MG) BY MOUTH DAILY (Patient taking differently: Take 20 mg by mouth 2 (two) times daily. ), Disp: 180 tablet, Rfl: 3  Review of  Systems  Constitutional: Positive for fatigue.  HENT: Positive for congestion (mild).   Respiratory: Positive for shortness of breath.   Cardiovascular: Negative.   Musculoskeletal: Negative.   Neurological: Positive for light-headedness.    Social History   Tobacco Use  . Smoking status: Former Smoker    Packs/day: 1.00    Types: Cigarettes    Quit date: 11/26/1999    Years since quitting: 19.9  . Smokeless tobacco: Never Used  Substance Use  Topics  . Alcohol use: No      Objective:   There were no vitals taken for this visit.  Physical Exam  Awake, alert, oriented x 3, no indications of distress.      Assessment & Plan     1. Community acquired pneumonia, unspecified laterality Now on trimethoprim-sulfa and prednisone after two [name] of doxycyline. Improving since hospitalization. Is to finish antibiotic and call Dr. Teodoro Kil office for follow up.   2. COVID-19 virus IgG antibody detected Unclear if related to current respiratory symptoms, but had negative NP swab on 12-18 and 10-26-2019  3. Restrictive lung disease Continue prednisone. Has 30 days supply from hospital discharge. Follow up pulmonary  4. On home oxygen therapy He is going to purchase home oximeter today. Counseled to call or go to ER if O2 saturations < 90% on oxygen of if shortness of breath worsens.     I discussed the assessment and treatment plan with the patient. The patient was provided an opportunity to ask questions and all were answered. The patient agreed with the plan and demonstrated an understanding of the instructions.   The patient was advised to call back or seek an in-person evaluation if the symptoms worsen or if the condition fails to improve as anticipated.  I provided 15 minutes of non-face-to-face time during this encounter.     Lelon Huh, MD  [facility]

## 2019-11-15 NOTE — Patient Instructions (Signed)
.   Please review the attached list of medications and notify my office if there are any errors.   . Please bring all of your medications to every appointment so we can make sure that our medication list is the same as yours.   . It is especially important to get the annual flu vaccine this year. If you haven't had it already, please go to your pharmacy or call the office as soon as possible to schedule you flu shot.  

## 2019-11-15 NOTE — Telephone Encounter (Signed)
FYI

## 2019-11-16 LAB — LEGIONELLA PNEUMOPHILA SEROGP 1 UR AG: L. pneumophila Serogp 1 Ur Ag: NEGATIVE

## 2019-11-17 ENCOUNTER — Ambulatory Visit: Payer: Self-pay | Admitting: *Deleted

## 2019-11-17 ENCOUNTER — Telehealth: Payer: Self-pay | Admitting: Family Medicine

## 2019-11-17 NOTE — Telephone Encounter (Signed)
Patient advised of Dr. Maralyn Sago recommendation and verbally voiced understanding. Patient states he has a follow up appointment with pulmonology on 12/02/2019.

## 2019-11-17 NOTE — Telephone Encounter (Signed)
Patient had hospital follow up on Monday for community acquired pneumonia. He was supposed to have started on trimethoprim-suulfa, prednisone, and checking his oximetry with a home pulse-ox  Please check with patient to make sure he was able to get started on antibiotic and predisone. And see what his oximetry has been running.   He was also supposed to have scheduled follow up with the pulmonologist Dr. Lanney Gins, please see if he was able to set up this appointment. If not we can do referral.   Thanks!  Patient is returning call- he has been taking medication since Monday.  12/22  O2 sat:                                       7 am -93                          11 am- 93                          1:30 pm- 96                          6 pm- 98  Wt: 7 am- 192 lb  BP: 7 am- 196/100         11 am- 141/92 12/23   O2 sat            6:30 am- 88             10:30 am 93            11:00 am 96  Wt: 193 1/2 lb BP: 6 am- 179/99        10:30 am - 140/73 P 64  Questions for PCP: Does he need to use Oxygen while sleeping with CPAP? How does step down Prednisone? Patient is awaiting new O2 tank- has not heard anything yet- gets it from [employer]. They are supposed to have patient information- [phone] [facility]. Can we reach out about that- he needs it when he goes to lunch.   Reason for Disposition . [1] Caller has NON-URGENT question AND [2] triager unable to answer question  Answer Assessment - Initial Assessment Questions 1. SYMPTOM: "What's the main symptom you're concerned about?" (e.g., breathing difficulty, fever, weakness)     Breathing difficulty- oxygen level -94 using 2.0 liters 2. ONSET: "When did the start?"     discharged Sunday- hospitalized Friday 3. BETTER-SAME-WORSE: "Are you getting better, staying the same, or getting worse compared to the day you were discharged?"     Patient feels he is better- patient has developed a cough- possibly due to medication  working to break up chest congestion. Patient is able to walk better 4. HOSPITALIZATION: "How long were you hospitalized?" (e.g., days)     3 days 5. DISCHARGE DATE: "What date were you discharged from the hospital?"     12 /20 6. DISCHARGE DOCTOR: "Who is the main doctor taking care of you now?"     PCP- patient needs to scheduled 7. DISCHARGE APPOINTMENT: "Have you scheduled a follow-up discharge appointment with your doctor?"     Not yet- patient has not made appointments- encouraged to do that 8. DISCHARGE MEDICATIONS: "Did the physician who discharged you order any new medications for you to use? If yes, have  you filled the prescription and started taking the medication?"      Antibiotic and prednisone 9. DISCHARGE ANTIBIOTICS: "Are you taking any antibiotic medication now?"      Will run out of antibiotic by first of week. Prednisone needs to step down- no direction on that  10. BREATHING DIFFICULTY: "Are you having any difficulty breathing?" If so, ask "How bad is it?"  (e.g., none, mild, moderate, severe)   - MILD: No SOB at rest, mild SOB with walking, speaks normally in sentences, can lay down, no retractions, pulse < 100.    - MODERATE: SOB at rest, SOB with minimal exertion and prefers to sit, cannot lie down flat, speaks in phrases, mild retractions, audible wheezing, pulse 100-120.    - SEVERE: Very SOB at rest, speaks in single words, struggling to breathe, sitting hunched forward, retractions, pulse > 120        mild 11. FEVER: "Do you have a fever?" If so, ask: "What is it, how was it measured and when did it start?"       no 12. OTHER SYMPTOMS: "Do you have any other symptoms?" (e.g., weakness, confusion, pain)       Cough- phlegm what color- dark brown(dime size)  Protocols used: PNEUMONIA ON ANTIBIOTIC POST-HOSPITALIZATION FOLLOW-UP CALL-A-AH

## 2019-11-17 NOTE — Telephone Encounter (Signed)
LMTCB, okay for [facility] to handle message below.

## 2019-11-17 NOTE — Telephone Encounter (Signed)
Patient had hospital follow up on Monday for community acquired pneumonia. He was supposed to have started on trimethoprim-suulfa, prednisone, and checking his oximetry with a home pulse-ox  Please check with patient to make sure he was able to get started on antibiotic and predisone. And see what his oximetry has been running.   He was also supposed to have scheduled follow up with the pulmonologist Dr. Lanney Gins, please see if he was able to set up this appointment. If not we can do referral.   Thanks!

## 2019-11-17 NOTE — Telephone Encounter (Signed)
He can cut prednisone dose to 1 tablet daily starting next Monday. Stay on that dose until he sees the pulmonologist.

## 2019-11-17 NOTE — Telephone Encounter (Signed)
I called patient back to clarify the home 02 saturation readings. Patient states the readings on 12/22 were while on 2L 02. The low reading of 88% on 12/23 was without 02. Patient has not called the pulmonologist yet, but says he plans to call today to schedule follow up appointment.   Patient wants to know if he should be tapered off of prednisone when he gets near the end of the prescription instead an abrupt stop? If so, when should he start tapering and how does he need to do this?   I asked patient about his question in regards to the 02 tank being delivered to his home. Patient states he talked to the company yesterday ([employer]) and they were supposed to bring a tank out to him yesterday. Patient says [employer] came while he was out doing errands. Patient states currently he is not at home. I advised patient that he needs to call the company to and speak with someone about coming out at a time that he is at home. Patient verbally voiced understanding and agrees to call [employer] to let them know when he will be at home for them to deliver his oxygen tank.

## 2019-11-17 NOTE — Telephone Encounter (Signed)
From [facility] 

## 2019-11-18 LAB — CULTURE, BLOOD (ROUTINE X 2)
Culture: NO GROWTH
Culture: NO GROWTH
Special Requests: ADEQUATE

## 2019-11-22 ENCOUNTER — Inpatient Hospital Stay
Admission: EM | Admit: 2019-11-22 | Discharge: 2019-11-28 | DRG: 438 | Disposition: A | Discharge status: Home / Self Care | Payer: Medicare Other | Attending: Family Medicine | Admitting: Family Medicine

## 2019-11-22 ENCOUNTER — Ambulatory Visit: Payer: Self-pay

## 2019-11-22 ENCOUNTER — Encounter: Payer: Self-pay | Admitting: Emergency Medicine

## 2019-11-22 ENCOUNTER — Other Ambulatory Visit: Payer: Self-pay

## 2019-11-22 ENCOUNTER — Emergency Department: Payer: Medicare Other

## 2019-11-22 DIAGNOSIS — I509 Heart failure, unspecified: Secondary | ICD-10-CM

## 2019-11-22 DIAGNOSIS — I2510 Atherosclerotic heart disease of native coronary artery without angina pectoris: Secondary | ICD-10-CM | POA: Diagnosis present

## 2019-11-22 DIAGNOSIS — R918 Other nonspecific abnormal finding of lung field: Secondary | ICD-10-CM | POA: Diagnosis not present

## 2019-11-22 DIAGNOSIS — I4510 Unspecified right bundle-branch block: Secondary | ICD-10-CM | POA: Diagnosis not present

## 2019-11-22 DIAGNOSIS — N183 Chronic kidney disease, stage 3 unspecified: Secondary | ICD-10-CM | POA: Diagnosis present

## 2019-11-22 DIAGNOSIS — K219 Gastro-esophageal reflux disease without esophagitis: Secondary | ICD-10-CM | POA: Diagnosis present

## 2019-11-22 DIAGNOSIS — N2889 Other specified disorders of kidney and ureter: Secondary | ICD-10-CM | POA: Diagnosis not present

## 2019-11-22 DIAGNOSIS — Z8679 Personal history of other diseases of the circulatory system: Secondary | ICD-10-CM

## 2019-11-22 DIAGNOSIS — Z743 Need for continuous supervision: Secondary | ICD-10-CM | POA: Diagnosis not present

## 2019-11-22 DIAGNOSIS — E785 Hyperlipidemia, unspecified: Secondary | ICD-10-CM | POA: Diagnosis not present

## 2019-11-22 DIAGNOSIS — Z951 Presence of aortocoronary bypass graft: Secondary | ICD-10-CM | POA: Diagnosis not present

## 2019-11-22 DIAGNOSIS — Z03818 Encounter for observation for suspected exposure to other biological agents ruled out: Secondary | ICD-10-CM | POA: Diagnosis not present

## 2019-11-22 DIAGNOSIS — K859 Acute pancreatitis without necrosis or infection, unspecified: Secondary | ICD-10-CM | POA: Diagnosis not present

## 2019-11-22 DIAGNOSIS — R7303 Prediabetes: Secondary | ICD-10-CM | POA: Diagnosis not present

## 2019-11-22 DIAGNOSIS — I5022 Chronic systolic (congestive) heart failure: Secondary | ICD-10-CM | POA: Diagnosis not present

## 2019-11-22 DIAGNOSIS — Z85820 Personal history of malignant melanoma of skin: Secondary | ICD-10-CM

## 2019-11-22 DIAGNOSIS — R1084 Generalized abdominal pain: Secondary | ICD-10-CM | POA: Diagnosis not present

## 2019-11-22 DIAGNOSIS — E7800 Pure hypercholesterolemia, unspecified: Secondary | ICD-10-CM | POA: Diagnosis not present

## 2019-11-22 DIAGNOSIS — Z7952 Long term (current) use of systemic steroids: Secondary | ICD-10-CM

## 2019-11-22 DIAGNOSIS — N184 Chronic kidney disease, stage 4 (severe): Secondary | ICD-10-CM | POA: Diagnosis present

## 2019-11-22 DIAGNOSIS — Z20822 Contact with and (suspected) exposure to covid-19: Secondary | ICD-10-CM | POA: Diagnosis not present

## 2019-11-22 DIAGNOSIS — J189 Pneumonia, unspecified organism: Secondary | ICD-10-CM | POA: Diagnosis not present

## 2019-11-22 DIAGNOSIS — Z888 Allergy status to other drugs, medicaments and biological substances status: Secondary | ICD-10-CM

## 2019-11-22 DIAGNOSIS — Z8619 Personal history of other infectious and parasitic diseases: Secondary | ICD-10-CM

## 2019-11-22 DIAGNOSIS — E669 Obesity, unspecified: Secondary | ICD-10-CM | POA: Diagnosis present

## 2019-11-22 DIAGNOSIS — Z95810 Presence of automatic (implantable) cardiac defibrillator: Secondary | ICD-10-CM | POA: Diagnosis not present

## 2019-11-22 DIAGNOSIS — Z8674 Personal history of sudden cardiac arrest: Secondary | ICD-10-CM | POA: Diagnosis not present

## 2019-11-22 DIAGNOSIS — R1030 Lower abdominal pain, unspecified: Secondary | ICD-10-CM | POA: Diagnosis not present

## 2019-11-22 DIAGNOSIS — I5020 Unspecified systolic (congestive) heart failure: Secondary | ICD-10-CM | POA: Diagnosis not present

## 2019-11-22 DIAGNOSIS — Z79899 Other long term (current) drug therapy: Secondary | ICD-10-CM

## 2019-11-22 DIAGNOSIS — Z87891 Personal history of nicotine dependence: Secondary | ICD-10-CM

## 2019-11-22 DIAGNOSIS — G4730 Sleep apnea, unspecified: Secondary | ICD-10-CM | POA: Diagnosis present

## 2019-11-22 DIAGNOSIS — M1710 Unilateral primary osteoarthritis, unspecified knee: Secondary | ICD-10-CM | POA: Diagnosis present

## 2019-11-22 DIAGNOSIS — Z8616 Personal history of COVID-19: Secondary | ICD-10-CM | POA: Diagnosis present

## 2019-11-22 DIAGNOSIS — Z9889 Other specified postprocedural states: Secondary | ICD-10-CM | POA: Diagnosis not present

## 2019-11-22 DIAGNOSIS — I499 Cardiac arrhythmia, unspecified: Secondary | ICD-10-CM | POA: Diagnosis not present

## 2019-11-22 DIAGNOSIS — M109 Gout, unspecified: Secondary | ICD-10-CM | POA: Diagnosis present

## 2019-11-22 DIAGNOSIS — I130 Hypertensive heart and chronic kidney disease with heart failure and stage 1 through stage 4 chronic kidney disease, or unspecified chronic kidney disease: Secondary | ICD-10-CM | POA: Diagnosis not present

## 2019-11-22 DIAGNOSIS — K853 Drug induced acute pancreatitis without necrosis or infection: Secondary | ICD-10-CM

## 2019-11-22 DIAGNOSIS — Z8261 Family history of arthritis: Secondary | ICD-10-CM

## 2019-11-22 DIAGNOSIS — Z6830 Body mass index (BMI) 30.0-30.9, adult: Secondary | ICD-10-CM | POA: Diagnosis not present

## 2019-11-22 DIAGNOSIS — K5900 Constipation, unspecified: Secondary | ICD-10-CM | POA: Diagnosis present

## 2019-11-22 DIAGNOSIS — Z20828 Contact with and (suspected) exposure to other viral communicable diseases: Secondary | ICD-10-CM | POA: Diagnosis not present

## 2019-11-22 DIAGNOSIS — N179 Acute kidney failure, unspecified: Secondary | ICD-10-CM | POA: Diagnosis present

## 2019-11-22 DIAGNOSIS — D72829 Elevated white blood cell count, unspecified: Secondary | ICD-10-CM | POA: Diagnosis present

## 2019-11-22 DIAGNOSIS — R1011 Right upper quadrant pain: Secondary | ICD-10-CM | POA: Diagnosis not present

## 2019-11-22 DIAGNOSIS — Z8249 Family history of ischemic heart disease and other diseases of the circulatory system: Secondary | ICD-10-CM

## 2019-11-22 DIAGNOSIS — Z955 Presence of coronary angioplasty implant and graft: Secondary | ICD-10-CM

## 2019-11-22 DIAGNOSIS — Z79891 Long term (current) use of opiate analgesic: Secondary | ICD-10-CM | POA: Diagnosis not present

## 2019-11-22 DIAGNOSIS — Z8673 Personal history of transient ischemic attack (TIA), and cerebral infarction without residual deficits: Secondary | ICD-10-CM

## 2019-11-22 DIAGNOSIS — R109 Unspecified abdominal pain: Secondary | ICD-10-CM

## 2019-11-22 DIAGNOSIS — G4733 Obstructive sleep apnea (adult) (pediatric): Secondary | ICD-10-CM | POA: Diagnosis not present

## 2019-11-22 DIAGNOSIS — I252 Old myocardial infarction: Secondary | ICD-10-CM

## 2019-11-22 DIAGNOSIS — R0902 Hypoxemia: Secondary | ICD-10-CM | POA: Diagnosis not present

## 2019-11-22 DIAGNOSIS — Z8551 Personal history of malignant neoplasm of bladder: Secondary | ICD-10-CM

## 2019-11-22 DIAGNOSIS — Z808 Family history of malignant neoplasm of other organs or systems: Secondary | ICD-10-CM

## 2019-11-22 DIAGNOSIS — Z7982 Long term (current) use of aspirin: Secondary | ICD-10-CM

## 2019-11-22 HISTORY — DX: Acute pancreatitis without necrosis or infection, unspecified: K85.90

## 2019-11-22 LAB — URINALYSIS, COMPLETE (UACMP) WITH MICROSCOPIC
Bacteria, UA: NONE SEEN
Bilirubin Urine: NEGATIVE
Glucose, UA: 50 mg/dL — AB
Hgb urine dipstick: NEGATIVE
Ketones, ur: NEGATIVE mg/dL
Leukocytes,Ua: NEGATIVE
Nitrite: NEGATIVE
Protein, ur: 30 mg/dL — AB
Specific Gravity, Urine: 1.043 — ABNORMAL HIGH (ref 1.005–1.030)
Squamous Epithelial / HPF: NONE SEEN (ref 0–5)
pH: 6 (ref 5.0–8.0)

## 2019-11-22 LAB — CBC WITH DIFFERENTIAL/PLATELET
Abs Immature Granulocytes: 0.41 10*3/uL — ABNORMAL HIGH (ref 0.00–0.07)
Basophils Absolute: 0.1 10*3/uL (ref 0.0–0.1)
Basophils Relative: 0 %
Eosinophils Absolute: 0 10*3/uL (ref 0.0–0.5)
Eosinophils Relative: 0 %
HCT: 50.2 % (ref 39.0–52.0)
Hemoglobin: 16.9 g/dL (ref 13.0–17.0)
Immature Granulocytes: 2 %
Lymphocytes Relative: 7 %
Lymphs Abs: 1.6 10*3/uL (ref 0.7–4.0)
MCH: 32.1 pg (ref 26.0–34.0)
MCHC: 33.7 g/dL (ref 30.0–36.0)
MCV: 95.4 fL (ref 80.0–100.0)
Monocytes Absolute: 1.4 10*3/uL — ABNORMAL HIGH (ref 0.1–1.0)
Monocytes Relative: 6 %
Neutro Abs: 19.4 10*3/uL — ABNORMAL HIGH (ref 1.7–7.7)
Neutrophils Relative %: 85 %
Platelets: 203 10*3/uL (ref 150–400)
RBC: 5.26 MIL/uL (ref 4.22–5.81)
RDW: 15.2 % (ref 11.5–15.5)
WBC: 22.9 10*3/uL — ABNORMAL HIGH (ref 4.0–10.5)
nRBC: 0 % (ref 0.0–0.2)

## 2019-11-22 LAB — COMPREHENSIVE METABOLIC PANEL
ALT: 28 U/L (ref 0–44)
AST: 26 U/L (ref 15–41)
Albumin: 3.2 g/dL — ABNORMAL LOW (ref 3.5–5.0)
Alkaline Phosphatase: 100 U/L (ref 38–126)
Anion gap: 14 (ref 5–15)
BUN: 31 mg/dL — ABNORMAL HIGH (ref 8–23)
CO2: 30 mmol/L (ref 22–32)
Calcium: 9 mg/dL (ref 8.9–10.3)
Chloride: 93 mmol/L — ABNORMAL LOW (ref 98–111)
Creatinine, Ser: 1.4 mg/dL — ABNORMAL HIGH (ref 0.61–1.24)
GFR calc Af Amer: 56 mL/min — ABNORMAL LOW (ref >60)
GFR calc non Af Amer: 48 mL/min — ABNORMAL LOW (ref >60)
Glucose, Bld: 214 mg/dL — ABNORMAL HIGH (ref 70–99)
Potassium: 4 mmol/L (ref 3.5–5.1)
Sodium: 137 mmol/L (ref 135–145)
Total Bilirubin: 2.1 mg/dL — ABNORMAL HIGH (ref 0.3–1.2)
Total Protein: 7.7 g/dL (ref 6.5–8.1)

## 2019-11-22 LAB — LIPID PANEL
Cholesterol: 156 mg/dL (ref 0–200)
HDL: 43 mg/dL (ref >40)
LDL Cholesterol: 77 mg/dL (ref 0–99)
Total CHOL/HDL Ratio: 3.6 RATIO
Triglycerides: 182 mg/dL — ABNORMAL HIGH (ref <150)
VLDL: 36 mg/dL (ref 0–40)

## 2019-11-22 LAB — TROPONIN I (HIGH SENSITIVITY)
Troponin I (High Sensitivity): 66 ng/L — ABNORMAL HIGH (ref <18)
Troponin I (High Sensitivity): 65 ng/L — ABNORMAL HIGH (ref <18)

## 2019-11-22 LAB — LIPASE, BLOOD: Lipase: 86 U/L — ABNORMAL HIGH (ref 11–51)

## 2019-11-22 MED ORDER — SODIUM CHLORIDE 0.9 % IV BOLUS
1000.0000 mL | Freq: Once | INTRAVENOUS | Status: AC
  Administered 2019-11-22: 1000 mL via INTRAVENOUS

## 2019-11-22 MED ORDER — LOSARTAN POTASSIUM 50 MG PO TABS
50.0000 mg | ORAL_TABLET | Freq: Every day | ORAL | Status: DC
  Administered 2019-11-22 – 2019-11-24 (×3): 50 mg via ORAL
  Filled 2019-11-22 (×3): qty 1

## 2019-11-22 MED ORDER — ACETAMINOPHEN 325 MG PO TABS: 650.0000 mg | ORAL_TABLET | Freq: Four times a day (QID) | ORAL | Status: DC | PRN

## 2019-11-22 MED ORDER — ONDANSETRON HCL 4 MG/2ML IJ SOLN
4.0000 mg | Freq: Once | INTRAMUSCULAR | Status: AC
  Administered 2019-11-22: 4 mg via INTRAVENOUS
  Filled 2019-11-22: qty 2

## 2019-11-22 MED ORDER — MORPHINE SULFATE (PF) 4 MG/ML IV SOLN
4.0000 mg | Freq: Once | INTRAVENOUS | Status: AC
  Administered 2019-11-22: 4 mg via INTRAVENOUS
  Filled 2019-11-22: qty 1

## 2019-11-22 MED ORDER — SODIUM CHLORIDE 0.9 % IV SOLN: INTRAVENOUS | Status: AC

## 2019-11-22 MED ORDER — ONDANSETRON HCL 4 MG PO TABS: 4.0000 mg | ORAL_TABLET | Freq: Four times a day (QID) | ORAL | Status: DC | PRN

## 2019-11-22 MED ORDER — LACTATED RINGERS IV BOLUS
1000.0000 mL | Freq: Once | INTRAVENOUS | Status: AC
  Administered 2019-11-22: 1000 mL via INTRAVENOUS

## 2019-11-22 MED ORDER — IOHEXOL 300 MG/ML  SOLN
100.0000 mL | Freq: Once | INTRAMUSCULAR | Status: AC | PRN
  Administered 2019-11-22: 100 mL via INTRAVENOUS

## 2019-11-22 MED ORDER — PANTOPRAZOLE SODIUM 40 MG IV SOLR
40.0000 mg | Freq: Once | INTRAVENOUS | Status: AC
  Administered 2019-11-22: 40 mg via INTRAVENOUS
  Filled 2019-11-22: qty 40

## 2019-11-22 MED ORDER — ATORVASTATIN CALCIUM 20 MG PO TABS
40.0000 mg | ORAL_TABLET | Freq: Every day | ORAL | Status: DC
  Administered 2019-11-22 – 2019-11-27 (×6): 40 mg via ORAL
  Filled 2019-11-22 (×6): qty 2

## 2019-11-22 MED ORDER — CARVEDILOL 6.25 MG PO TABS
12.5000 mg | ORAL_TABLET | Freq: Every day | ORAL | Status: DC
  Administered 2019-11-22 – 2019-11-28 (×7): 12.5 mg via ORAL
  Filled 2019-11-22 (×7): qty 2

## 2019-11-22 MED ORDER — FENTANYL CITRATE (PF) 100 MCG/2ML IJ SOLN
50.0000 ug | INTRAMUSCULAR | Status: DC | PRN
  Administered 2019-11-22: 19:00:00 50 ug via INTRAVENOUS
  Filled 2019-11-22: qty 2

## 2019-11-22 MED ORDER — PANTOPRAZOLE SODIUM 40 MG PO TBEC
40.0000 mg | DELAYED_RELEASE_TABLET | Freq: Every day | ORAL | Status: DC
  Administered 2019-11-23 – 2019-11-28 (×6): 40 mg via ORAL
  Filled 2019-11-22 (×6): qty 1

## 2019-11-22 MED ORDER — ACETAMINOPHEN 650 MG RE SUPP: 650.0000 mg | Freq: Four times a day (QID) | RECTAL | Status: DC | PRN

## 2019-11-22 MED ORDER — ONDANSETRON HCL 4 MG/2ML IJ SOLN: 4.0000 mg | Freq: Four times a day (QID) | INTRAMUSCULAR | Status: DC | PRN

## 2019-11-22 MED ORDER — ALLOPURINOL 300 MG PO TABS
300.0000 mg | ORAL_TABLET | Freq: Every day | ORAL | Status: DC
  Administered 2019-11-23 – 2019-11-28 (×6): 300 mg via ORAL
  Filled 2019-11-22 (×6): qty 1

## 2019-11-22 MED ORDER — HYDROCODONE-ACETAMINOPHEN 5-325 MG PO TABS
1.0000 | ORAL_TABLET | ORAL | Status: DC | PRN
  Administered 2019-11-23 – 2019-11-27 (×7): 2 via ORAL
  Filled 2019-11-22 (×3): qty 2
  Filled 2019-11-22: qty 1
  Filled 2019-11-22 (×4): qty 2

## 2019-11-22 NOTE — Telephone Encounter (Signed)
[place] and informed Arbie Cookey of triage call and 911 disposition, pt's name and DOB. Spoke with Arbie Cookey.

## 2019-11-22 NOTE — Telephone Encounter (Signed)
FYI

## 2019-11-22 NOTE — ED Triage Notes (Signed)
C/O abdominal pain x 2 days.  20 LAC by EMS.  Patient unsure when last BM was.  Abdomen distended.

## 2019-11-22 NOTE — H&P (Addendum)
Taylor [name] BPZ:[id number] DOB: [birth year] DOA: 11/22/2019     PCP: Birdie Sons, MD   Outpatient Specialists:   CARDS:  Dr. Sidney Ace   Patient arrived to ER on 11/22/19 at 61  Patient coming from: home Lives   With family    Chief Complaint:   Chief Complaint  Patient presents with  . Abdominal Pain    HPI: Taylor [name] is a 78 y.o. male with medical history significant of AAA, AICD in place CAD CHF GERD hypertension, coronavirus IgG assay positive Bladder cancer, prediabetes sleep apnea on CPAP history of stroke  Presented with worsening abdominal pain constant severe radiating to his back Recent admission for pneumonia was discharged on a course of prednisone and he has been continue to take finish his Bactrim recently.  Patient have had recent hospitalization.  Of note back in November patient developed fatigue sore throat and cough he tested negative for Covid on 1 December he continued to have worsening headaches was treated with doxycycline but continued to have progressive shortness of breath he was finally admitted on 18 December at that time he was hypoxic down to 88% room air still Covid negative Procalcitonin 0.25 at the time CT scan showed patchy opacities more right than left. Despite the patient was Covid negative [place] included to the time atypical pneumonia versus suspected Covid infection versus noninfectious pneumonia.  Patient was discharged on prednisone and Bactrim with plan to follow-up with pulmonology   IgG Covid was obtained and came up to be positive. Suggested that patient did have Covid infection although was not detected by prior testing   Infectious risk factors:  Reports  N/V abdominal pain,  Body aches, severe fatigue    Presumed  POSITIVE based on IgG results    Lab Results  Component Value Date   [place] NEGATIVE 11/12/2019   [place] Not Detected 10/26/2019     Regarding pertinent Chronic problems:      Hyperlipidemia -  on statins Lipitor   HTN on Coreg, lasartan    chronic CHF  Systolic  - last echo 24/2353 with EF of 20%  On Demadex Was on enteresto V-fib arrest in 1994,  AAA - abdominal aortic aneurysm s/p endovascular repair in 2008,   CAD  - On Aspirin, statin, betablocker,                 -  followed by cardiology                 s/p CABG x4 in 2007 at [facility] (LIMA to LAD, SVG to OM1, OM2, and OM3),    DM 2 -  Lab Results  Component Value Date   HGBA1C 6.3 (H) 05/12/2019   diet controlled    obesity-   BMI Readings from Last 1 Encounters:  11/22/19 30.05 kg/m         Hx of CVA -  with/out residual deficits on Aspirin 81 mg,      CKD stage III - baseline Cr  1.6 Lab Results  Component Value Date   CREATININE 1.40 (H) 11/22/2019   CREATININE 1.80 (H) 11/14/2019   CREATININE 1.66 (H) 11/13/2019         While in ER: Found to have elevated blood cell count up to 22.9 troponin 66-65 Slightly elevated lipase into 80s. CT abdomen showed  acute pancreatitis with inflammation of the duodenum  Incidentally found to have renal mass on the left kidney Also right basilar pneumonia Otherwise  normal LFTs His pain improved with IV fentanyl but then he required some more IV morphine    The following Work up has been ordered so far:  Orders Placed This Encounter  Procedures  . CT Abdomen Pelvis W Contrast  . CBC with Differential  . Comprehensive metabolic panel  . Lipase, blood  . Urinalysis, Complete w Microscopic  . Lipid panel  . Consult to hospitalist  . ED EKG  . EKG 12-Lead  . Insert peripheral IV    Following Medications were ordered in ER: Medications  fentaNYL (SUBLIMAZE) injection 50 mcg (50 mcg Intravenous Given 11/22/19 1850)  ondansetron (ZOFRAN) injection 4 mg (4 mg Intravenous Given 11/22/19 1626)  sodium chloride 0.9 % bolus 1,000 mL (0 mLs Intravenous Stopped 11/22/19 2027)  iohexol (OMNIPAQUE) 300 MG/ML solution 100 mL (100 mLs Intravenous  Contrast Given 11/22/19 1708)  morphine 4 MG/ML injection 4 mg (4 mg Intravenous Given 11/22/19 2032)  ondansetron (ZOFRAN) injection 4 mg (4 mg Intravenous Given 11/22/19 2031)  pantoprazole (PROTONIX) injection 40 mg (40 mg Intravenous Given 11/22/19 2033)  lactated ringers bolus 1,000 mL (1,000 mLs Intravenous New Bag/Given 11/22/19 2040)        Consult Orders  (From admission, onward)         Start     Ordered   11/22/19 2014  Consult to hospitalist  Once    Provider:  (Not yet assigned)  Question Answer Comment  Place call to: [phone]   Reason for Consult Admit   Diagnosis/Clinical Info for Consult: pancreatitis, possibly due to prednisone. recent tx for PNA.      11/22/19 2013          Significant initial  Findings: Abnormal Labs Reviewed  CBC WITH DIFFERENTIAL/PLATELET - Abnormal; Notable for the following components:      Result Value   WBC 22.9 (*)    Neutro Abs 19.4 (*)    Monocytes Absolute 1.4 (*)    Abs Immature Granulocytes 0.41 (*)    All other components within normal limits  COMPREHENSIVE METABOLIC PANEL - Abnormal; Notable for the following components:   Chloride 93 (*)    Glucose, Bld 214 (*)    BUN 31 (*)    Creatinine, Ser 1.40 (*)    Albumin 3.2 (*)    Total Bilirubin 2.1 (*)    GFR calc non Af Amer 48 (*)    GFR calc Af Amer 56 (*)    All other components within normal limits  LIPASE, BLOOD - Abnormal; Notable for the following components:   Lipase 86 (*)    All other components within normal limits  LIPID PANEL - Abnormal; Notable for the following components:   Triglycerides 182 (*)    All other components within normal limits  TROPONIN I (HIGH SENSITIVITY) - Abnormal; Notable for the following components:   Troponin I (High Sensitivity) 66 (*)    All other components within normal limits  TROPONIN I (HIGH SENSITIVITY) - Abnormal; Notable for the following components:   Troponin I (High Sensitivity) 65 (*)    All other components  within normal limits    Otherwise labs showing:    Recent Labs  Lab 11/22/19 1623  NA 137  K 4.0  CO2 30  GLUCOSE 214*  BUN 31*  CREATININE 1.40*  CALCIUM 9.0    Cr   stable,   Lab Results  Component Value Date   CREATININE 1.40 (H) 11/22/2019   CREATININE 1.80 (H) 11/14/2019   CREATININE  1.66 (H) 11/13/2019    Recent Labs  Lab 11/22/19 1623  AST 26  ALT 28  ALKPHOS 100  BILITOT 2.1*  PROT 7.7  ALBUMIN 3.2*   Lab Results  Component Value Date   CALCIUM 9.0 11/22/2019   PHOS 3.1 01/22/2019      WBC      Component Value Date/Time   WBC 22.9 (H) 11/22/2019 1623   ANC    Component Value Date/Time   NEUTROABS 19.4 (H) 11/22/2019 1623   NEUTROABS 5.4 07/10/2015 1412   ALC No components found for: LYMPHAB    Plt: Lab Results  Component Value Date   PLT 203 11/22/2019     Lactic Acid, Venous    Component Value Date/Time   LATICACIDVEN 1.7 11/13/2019 0005        COVID-19 Labs  No results for input(s): DDIMER, FERRITIN, LDH, CRP in the last 72 hours.  Lab Results  Component Value Date   SARSCOV2NAA NEGATIVE 11/12/2019   [place] Not Detected 10/26/2019    HG/HCT   Stable,     Component Value Date/Time   HGB 16.9 11/22/2019 1623   HGB 14.2 08/10/2018 1213   HCT 50.2 11/22/2019 1623   HCT 40.9 08/10/2018 1213    Recent Labs  Lab 11/22/19 1623  LIPASE 86*   No results for input(s): AMMONIA in the last 168 hours.  No components found for: LABALBU   Troponin 65     ECG: Ordered Personally reviewed by me showing: HR : 62 Rhythm:   Paced   no evidence of ischemic changes QTC 685   BNP (last 3 results) Recent Labs    11/12/19 1934  BNP 102.0*    ProBNP (last 3 results) No results for input(s): PROBNP in the last 8760 hours.  DM  labs:  HbA1C: Recent Labs    05/12/19 0914  HGBA1C 6.3*       CBG (last 3)  No results for input(s): GLUCAP in the last 72 hours.     UA   no evidence of UTI      Urine  analysis:    Component Value Date/Time   COLORURINE YELLOW (A) 11/22/2019 2134   APPEARANCEUR CLEAR (A) 11/22/2019 2134   LABSPEC 1.043 (H) 11/22/2019 2134   PHURINE 6.0 11/22/2019 2134   GLUCOSEU 50 (A) 11/22/2019 2134   HGBUR NEGATIVE 11/22/2019 2134   BILIRUBINUR NEGATIVE 11/22/2019 2134   BILIRUBINUR negative 03/31/2018 [place] 11/22/2019 2134   PROTEINUR 30 (A) 11/22/2019 2134   UROBILINOGEN 0.2 03/31/2018 1452   NITRITE NEGATIVE 11/22/2019 2134   LEUKOCYTESUR NEGATIVE 11/22/2019 2134      Ordered   CXR - Persistent bilateral multifocal airspace opacities with slight progression since the prior studies consistent with an atypical infectious process such as viral pneumonia.   CTabd/pelvis - pancreatitis     ED Triage Vitals  Enc Vitals Group     BP 11/22/19 1618 (!) 145/107     Pulse Rate 11/22/19 1618 60     Resp 11/22/19 1618 (!) 24     Temp 11/22/19 1618 98.4 F (36.9 C)     Temp Source 11/22/19 1618 Oral     SpO2 11/22/19 1618 95 %     Weight 11/22/19 1615 209 lb 7 oz (95 kg)     Height --      Head Circumference --      Peak Flow --      Pain Score 11/22/19 1615 9  Pain Loc --      Pain Edu? --      Excl. in [place]? --   TMAX(24)@       Latest  Blood pressure (!) 145/107, pulse 60, temperature 98.4 F (36.9 C), temperature source Oral, resp. rate (!) 24, weight 95 kg, SpO2 95 %.    Hospitalist was called for admission for acute pancreatitis in the setting of recent COVID infection   Review of Systems:    Pertinent positives include: fatigue, abdominal pain,  Constitutional:  No weight loss, night sweats, Fevers, chills, , weight loss  HEENT:  No headaches, Difficulty swallowing,Tooth/dental problems,Sore throat,  No sneezing, itching, ear ache, nasal congestion, post nasal drip,  Cardio-vascular:  No chest pain, Orthopnea, PND, anasarca, dizziness, palpitations.no Bilateral lower extremity swelling  GI:  No heartburn,  indigestion,  nausea, vomiting, diarrhea, change in bowel habits, loss of appetite, melena, blood in stool, hematemesis Resp:  no shortness of breath at rest. No dyspnea on exertion, No excess mucus, no productive cough, No non-productive cough, No coughing up of blood.No change in color of mucus. No wheezing. Skin:  no rash or lesions. No jaundice GU:  no dysuria, change in color of urine, no urgency or frequency. No straining to urinate.  No flank pain.  Musculoskeletal:  No joint pain or no joint swelling. No decreased range of motion. No back pain.  Psych:  No change in mood or affect. No depression or anxiety. No memory loss.  Neuro: no localizing neurological complaints, no tingling, no weakness, no double vision, no gait abnormality, no slurred speech, no confusion  All systems reviewed and apart from [place] all are negative  Past Medical History:   Past Medical History:  Diagnosis Date  . AAA (abdominal aortic aneurysm) [facility]) 06/03/2007   [facility]; Dr. Kellie Simmering  . AICD (automatic cardioverter/defibrillator) present   . Barrett's esophagus   . Bladder cancer ([place])   . Bradycardia   . CAD (coronary artery disease)   . CHF (congestive heart failure) ([place])   . Cluster headache   . DDD (degenerative disc disease), lumbar   . Dyspnea    WITH EXERTION  . Edema    LEFT ANKLE  . Fracture of skull base ([place]) 1997   due to fall  . GERD (gastroesophageal reflux disease)   . Gout   . History of bladder cancer 12/1995  . Hyperlipidemia   . Hypertension   . Malignant melanoma ([place]) 12/2012   right dorsal forearm excised  . Myocardial infarction ([place])    LAST 2014  . Osteoarthritis of knee   . Pacemaker 10/10/2006  . Pneumonia    2016  . Pre-diabetes   . Psoriasis   . Rib fracture 1997   due to fall  . Sleep apnea    CPAP  . Stroke ([place])   . Venous incompetence       Past Surgical History:  Procedure Laterality Date  . ABDOMINAL AORTIC ANEURYSM  REPAIR  06/03/2007   [facility]; Dr. Kellie Simmering  . ANGIOPLASTY  1994   MI  . BLADDER TUMOR EXCISION  12/1995  . CATARACT EXTRACTION W/PHACO Left 10/22/2016   Procedure: CATARACT EXTRACTION PHACO AND INTRAOCULAR LENS PLACEMENT (IOC);  Surgeon: Birder Robson, MD;  Location: [facility] ORS;  Service: Ophthalmology;  Laterality: Left;  [place] 47.7 AP% 18.4 CDE 8.78 Fluid pack lot # [id number]  . CATARACT EXTRACTION W/PHACO Right 12/10/2016   Procedure: CATARACT EXTRACTION PHACO AND INTRAOCULAR LENS PLACEMENT (  [place]);  Surgeon: Birder Robson, MD;  Location: [facility] ORS;  Service: Ophthalmology;  Laterality: Right;  [place] 00:39 AP% 23.3 CDE 9.13 Fluid pack lot # [id number]  . CORONARY ANGIOPLASTY     STENTS X 5  . CORONARY ARTERY BYPASS GRAFT  09/22/2006   four  . ELBOW BURSA SURGERY     DUE TO GOUT  . INSERT / REPLACE / REMOVE PACEMAKER    . MELANOMA EXCISION  12/2012   Right forearm    Social History:  Ambulatory   independently cane, walker  wheelchair bound, bed bound     reports that he quit smoking about 20 years ago. His smoking use included cigarettes. He smoked 1.00 pack per day. He has never used smokeless tobacco. He reports that he does not drink alcohol or use drugs.   Family History:   Family History  Problem Relation Age of Onset  . Cancer Mother        Melanoma skin cancer  . Heart attack Father 72  . Cancer Father        throat cancer  . Arthritis Brother     Allergies: Allergies  Allergen Reactions  . Amlodipine Besylate Swelling    Had a reaction when taking with colcrys   . Crestor [Rosuvastatin]     Muscle cramps and pain  . Rocephin [Ceftriaxone]     unknown     Prior to Admission medications   Medication Sig Start Date End Date Taking? Authorizing Provider  allopurinol (ZYLOPRIM) 300 MG tablet Take 2 tablets (600 mg total) by mouth daily. 01/27/19  Yes Birdie Sons, MD  aspirin 81 MG EC tablet Take 81 mg by mouth daily.    Yes [provider]  atorvastatin (LIPITOR) 40 MG tablet TAKE 1 TABLET BY MOUTH EVERY DAY 12/22/18  Yes Birdie Sons, MD  carvedilol (COREG) 12.5 MG tablet TAKE 1 TABLET BY MOUTH DAILY 08/31/19  Yes Birdie Sons, MD  Cyanocobalamin (VITAMIN B-12 CR) 1000 MCG TBCR Take 1,000 mcg by mouth daily.    Yes [provider]  fluticasone (FLONASE) 50 MCG/ACT nasal spray Place 2 sprays into both nostrils daily. 11/08/19 12/08/19 Yes Birdie Sons, MD  losartan (COZAAR) 50 MG tablet TAKE 2 TABLETS(100 MG) BY MOUTH DAILY Patient taking differently: Take 50 mg by mouth daily.  07/08/19  Yes Birdie Sons, MD  pantoprazole (PROTONIX) 40 MG tablet TAKE 1 TABLET BY MOUTH EVERY DAY 04/09/19  Yes Birdie Sons, MD  predniSONE (DELTASONE) 20 MG tablet Take 2 tablets (40 mg total) by mouth daily with breakfast. 11/14/19  Yes Danford, Suann Larry, MD  sulfamethoxazole-trimethoprim (BACTRIM) 400-80 MG tablet Take 1 tablet by mouth every 12 (twelve) hours. 11/14/19  Yes Danford, Suann Larry, MD  torsemide (DEMADEX) 20 MG tablet TAKE 2 TABLETS(40 MG) BY MOUTH DAILY Patient taking differently: Take 20 mg by mouth 2 (two) times daily.  05/25/19  Yes Birdie Sons, MD  indomethacin (INDOCIN) 50 MG capsule Take 1 capsule (50 mg total) by mouth 3 (three) times daily as needed (for gout flares). 01/22/19   Birdie Sons, MD   Physical Exam: Blood pressure (!) 145/107, pulse 60, temperature 98.4 F (36.9 C), temperature source Oral, resp. rate (!) 24, weight 95 kg, SpO2 95 %. 1. General:  in No   Acute distress    Chronically ill -appearing 2. Psychological: Alert and   Oriented 3. Head/ENT:    Dry Mucous Membranes  Head Non traumatic, neck supple                            Poor Dentition 4. SKIN:  decreased Skin turgor,  Skin clean Dry and intact no rash 5. Heart: Regular rate and rhythm no Murmur, no Rub or gallop 6. Lungs:  no wheezes or crackles   7. Abdomen: Soft, LLQ   tender, Non distended bowel sounds present 8. Lower extremities: no clubbing, cyanosis, no edema 9. Neurologically Grossly intact, moving all 4 extremities equally  10. MSK: Normal range of motion   All other LABS:     Recent Labs  Lab 11/22/19 1623  WBC 22.9*  NEUTROABS 19.4*  HGB 16.9  HCT 50.2  MCV 95.4  PLT 203     Recent Labs  Lab 11/22/19 1623  NA 137  K 4.0  CL 93*  CO2 30  GLUCOSE 214*  BUN 31*  CREATININE 1.40*  CALCIUM 9.0     Recent Labs  Lab 11/22/19 1623  AST 26  ALT 28  ALKPHOS 100  BILITOT 2.1*  PROT 7.7  ALBUMIN 3.2*       Cultures:    Component Value Date/Time   SDES BLOOD RIGHT HAND 11/13/2019 0006   SPECREQUEST  11/13/2019 0006    BOTTLES DRAWN AEROBIC AND ANAEROBIC Blood Culture adequate volume   CULT  11/13/2019 0006    NO GROWTH 5 DAYS Performed at [facility], [address] [place], [place] [address]    REPTSTATUS 11/18/2019 FINAL 11/13/2019 0006     Radiological Exams on Admission: DG Chest 2 View  Result Date: 11/22/2019 CLINICAL DATA:  Congestion EXAM: CHEST - 2 VIEW COMPARISON:  November 12, 2019 FINDINGS: Again identified are multifocal airspace opacities bilaterally with some progression since the prior study. The heart size remains enlarged. A multi lead left-sided pacemaker is noted. The lungs are hyperexpanded. There is no pneumothorax. No significant pleural effusion. IMPRESSION: Persistent bilateral multifocal airspace opacities with slight progression since the prior studies consistent with an atypical infectious process such as viral pneumonia. Electronically Signed   By: Constance Holster M.D.   On: 11/22/2019 22:51   CT Abdomen Pelvis W Contrast  Result Date: 11/22/2019 CLINICAL DATA:  Abdominal pain and distension EXAM: CT ABDOMEN AND PELVIS WITH CONTRAST TECHNIQUE: Multidetector CT imaging of the abdomen and pelvis was performed using the standard protocol following bolus administration of  intravenous contrast. CONTRAST:  156mL OMNIPAQUE IOHEXOL 300 MG/ML  SOLN COMPARISON:  May 04, 2007 FINDINGS: Lower chest: There is atelectatic change in the lung bases. There are scattered foci of patchy infiltrate in the lung bases, primarily in the right middle and lower lobe regions which are visualized. Pacemaker lead tips are noted in the right atrium and right ventricle. There are foci of coronary artery calcification. Hepatobiliary: There is hepatic steatosis. No focal liver lesions are evident. The gallbladder wall is not appreciably thickened. There is no biliary duct dilatation. Pancreas: There is soft tissue stranding in the head and uncinate process of the pancreas consistent with a degree of acute pancreatitis in this area. The pancreatic tail and body of the pancreas appear normal. There is no mass or pancreatic duct dilatation. Inflammatory change abuts the mid the distal second portions of the duodenum as well as the proximal third portion of the duodenum. No more distant soft tissue stranding or fluid seen. Spleen: No splenic lesions are evident. Adrenals/Urinary Tract: Adrenals bilaterally  appear unremarkable. The left kidney is atrophic with marked cortical thinning. There is compensatory hypertrophy of the right kidney. There is a cyst in the medial mid right kidney measuring 1.3 x 1.3 cm. There is a 5 mm cyst arising from the medial upper pole of the right kidney. There is a 1 x 1 cm solid-appearing mass arising from the upper pole left kidney. There is no evident hydronephrosis on either side. There is no renal or ureteral calculus on either side. Urinary bladder is midline with wall thickness within normal limits. Stomach/Bowel: There are multiple sigmoid and descending colonic diverticula without diverticulitis. There is wall thickening involving much of the second portion of the duodenum in the proximal third portion of the duodenum due to the adjacent pancreatitis. No other bowel wall  thickening noted. Terminal ileum appears normal. No evident bowel obstruction. There is no free air or portal venous air. Vascular/Lymphatic: There is a stent in the aorta and common iliac arteries. There is atherosclerotic plaque in the aorta. The current maximum diameter of the aorta at the mid kidney level measures 3.6 x 3.1 cm. There is atherosclerotic calcification and plaque in multiple pelvic arterial vessels. No adenopathy is appreciable in the abdomen or pelvis. Reproductive: Prostate and seminal vesicles appear normal in size and contour. No pelvic mass evident. Other: Appendix appears normal. No evident abscess or ascites in the abdomen or pelvis. There is fat in each inguinal ring. There is slight fat in the umbilicus. Musculoskeletal: There is degenerative change in the lumbar spine, most notably at L2-3. There are no blastic or lytic bone lesions. There is mild spinal stenosis at L2-3 due to bony hypertrophy and disc bulging. No intramuscular lesions are evident. IMPRESSION: 1. Evidence of a degree of acute pancreatitis involving the head and uncinate process of the pancreas with localized mesenteric thickening in this region. The body and tail the pancreas appear normal. There is no mass or pseudocyst evident. No pancreatic duct dilatation. No more distant fluid or mesenteric thickening. 2. Wall thickening involving the second and proximal third portions of the duodenum, apparently due to inflammation from nearby pancreatitis. 3. Atrophic left kidney. 1 x 1 cm noncystic appearing mass arises from the upper pole this atrophic left kidney. A small renal cell carcinoma must be of concern. Further evaluation with nonemergent pre and post contrast MRI should be considered. Pre and post contrast CT could alternatively be performed, but would likely be of decreased accuracy given lesion size. 4.  Apparent basilar pneumonia, primarily on the right. 5. Multiple descending colonic and sigmoid diverticula  without diverticulitis. No bowel obstruction. No abscess in the abdomen or pelvis. Appendix appears normal. 6. Aortic and iliac artery stent. Maximum current dilatation of the aorta at the mid kidney level measures 3.6 x 3.1 cm. There is no periaortic fluid. There is extensive multilevel arterial vascular calcification. 7. There is a degree of spinal stenosis at L2-3 due to bony hypertrophy and disc bulging. 8.  Hepatic steatosis. Aortic Atherosclerosis (ICD10-I70.0). Electronically Signed   By: Lowella Grip III M.D.   On: 11/22/2019 17:29    Chart has been reviewed    Assessment/Plan  78 y.o. male with medical history significant of likely recent COVID infection AAA, AICD in place CAD CHF GERD hypertension, coronavirus IgG assay positive Bladder cancer, prediabetes sleep apnea on CPAP history of stroke  Admitted for pancreatitis  Present on Admission: . Pancreatitis - most likely cause being   medication induced or idiopathic  CT of  abdomen showing acute pancreatitis  no evidence of pseudocyst Will rehydrate with  IV fluids  Pain is atypical in the lower abdomen Keep n.p.o. Follow clinically    -Check lipid panel Triglycerides 182 - Discontinue offending medications  Such as bactrim ( pt finished course) Control pain with IV pain medications If no significant improvement in the next 24 hours may benefit from GI consult    . Systolic CHF with reduced left ventricular function, NYHA class 3 (HCC) sp . Cardiac defibrillator in place Avoid over aggressive fluid resuscitation will monitor fluid status carefully  . Chronic kidney disease (CKD), stage III (moderate) -avoid nephrotoxic medications  . Hypercholesteremia - stable continue home meds if able to tolerate  . Obstructive sleep apnea - no CPAP while had recent COVID infection  . Suspected COVID-19 virus infection - unclear if fully asymptomatic Given recent admission Continue airborne precautions for now and reassess may  need ID consult to determine when safe to dc precautions CXR - showing persistent bilateral infiltrates  Unclear if lagging radiological findings vs still active COVID If becomes respiratory symptomatic may consider remdesivir trial  . Leukocytosis - in the setting of recent steroids   Renal  mass - incidental finding but will need to be further worked up on [facility] patient basis once pt is stable  Other plan as per orders.  DVT prophylaxis:  SCD   Code Status:  FULL CODE   as per patient   I had personally discussed CODE STATUS with patient   Family Communication:   Family not at  Bedside   Disposition Plan:    To home once workup is complete and patient is stable                      Would benefit from PT/OT eval prior to DC  Ordered                                       Consults called: none   Admission status:  ED Disposition    ED Disposition Condition Comment   Admit  The patient appears reasonably stabilized for admission considering the current resources, flow, and capabilities available in the ED at this time, and I doubt any other [facility] requiring further screening and/or treatment in the ED prior to admission is  present.        inpatient     Expect 2 midnight stay secondary to severity of patient's current illness including  hemodynamic instability despite optimal treatment   Severe lab/radiological/exam abnormalities including:  pancreatitis   and extensive comorbidities including:  DM2    CHF   CAD   CKD   That are currently affecting medical management.  I expect  patient to be hospitalized for 2 midnights requiring inpatient medical care.  Patient is at high risk for adverse outcome (such as loss of life or disability) if not treated.  Indication for inpatient stay as follows:  Severe change from baseline regarding mental status Hemodynamic instability despite maximal medical therapy,  ongoing suicidal ideations,  severe pain requiring acute inpatient  management,  inability to maintain oral hydration   persistent chest pain despite medical management Need for operative/procedural  intervention New or worsening hypoxia  Need for IV antibiotics, IV fluids, IV rate controling medications, IV antihypertensives, IV pain medications, IV anticoagulation     Level of care  tele  For 12H     Precautions: admitted as  PUI   Airborne and Contact precautions  If Covid PCR is negative  -   - would need additional investigation given very high risk for false native test result   PPE: Used by the provider:   P100  eye Goggles,  Gloves  gown    Henchy Mccauley 11/23/2019, 2:10 AM    [facility]     after 2 AM please page floor coverage PA If 7AM-7PM, please contact the day team taking care of the patient using [email or web address]

## 2019-11-22 NOTE — ED Provider Notes (Signed)
[facility] Emergency Department Provider Note  ____________________________________________  Time seen: Approximately 8:14 PM  I have reviewed the triage vital signs and the nursing notes.   HISTORY  Chief Complaint Abdominal Pain    HPI KIOWA HOLLAR is a 78 y.o. male with a history of AAA, CAD CHF GERD hypertension  who comes the ED complaining of worsening abdominal pain, gradual onset since yesterday.  Constant, severe, radiating to his back.  No aggravating or alleviating factors.  Patient was recently hospitalized for acute hypoxic respiratory failure secondary to pneumonia.  On discharge, he was prescribed antibiotics and a course of prednisone which he continued taking.  He reports he did not take the prednisone today because he felt too sick.  He does report completing his course of Bactrim.  Denies significant alcohol use.  Denies history of pancreatitis or biliary disease.  No recent trauma.  Review of electronic medical record shows that patient had prolonged illness of approximately 6 weeks prior to his hospitalization with hypoxia.  A coronavirus IgG assay was positive indicating prior Covid infection.     Past Medical History:  Diagnosis Date  . AAA (abdominal aortic aneurysm) [facility]) 06/03/2007   [facility]; Dr. Kellie Simmering  . AICD (automatic cardioverter/defibrillator) present   . Barrett's esophagus   . Bladder cancer ([place])   . Bradycardia   . CAD (coronary artery disease)   . CHF (congestive heart failure) ([place])   . Cluster headache   . DDD (degenerative disc disease), lumbar   . Dyspnea    WITH EXERTION  . Edema    LEFT ANKLE  . Fracture of skull base ([place]) 1997   due to fall  . GERD (gastroesophageal reflux disease)   . Gout   . History of bladder cancer 12/1995  . Hyperlipidemia   . Hypertension   . Malignant melanoma ([place]) 12/2012   right dorsal forearm excised  . Myocardial infarction ([place])    LAST 2014  .  Osteoarthritis of knee   . Pacemaker 10/10/2006  . Pneumonia    2016  . Pre-diabetes   . Psoriasis   . Rib fracture 1997   due to fall  . Sleep apnea    CPAP  . Stroke ([place])   . Venous incompetence      Patient Active Problem List   Diagnosis Date Noted  . CAP (community acquired pneumonia) 11/13/2019  . Hyperuricemia 03/31/2019  . Numbness 05/07/2018  . Benign colonic polyp 03/24/2017  . Tibialis anterior tenosynovitis 12/21/2015  . Pleural nodule 07/27/2015  . Psoriatic arthritis ([place]) 05/12/2015  . Restrictive lung disease 05/12/2015  . Barrett esophagus 03/30/2015  . Ache in joint 03/30/2015  . Bradycardia 03/30/2015  . Arteriosclerosis of coronary artery 03/30/2015  . Cardiac defibrillator in place 03/30/2015  . Chronic kidney disease (CKD), stage III (moderate) 03/30/2015  . Cluster headache syndrome 03/30/2015  . Degeneration of lumbar or lumbosacral intervertebral disc 03/30/2015  . Gout 03/30/2015  . Personal history of malignant neoplasm of bladder 03/30/2015  . Hypercholesteremia 03/30/2015  . LBP (low back pain) 03/30/2015  . Personal history of malignant melanoma of skin 03/30/2015  . Muscle ache 03/30/2015  . Arthritis of knee, degenerative 03/30/2015  . Prediabetes 03/30/2015  . Psoriasis 03/30/2015  . Obstructive sleep apnea 03/30/2015  . Chronic venous insufficiency 03/30/2015  . History of abdominal aortic aneurysm (AAA) 06/24/2013  . History of CVA (cerebrovascular accident) 06/24/2013  . Cardiomyopathy, ischemic 06/24/2013  . History of AAA (  abdominal aortic aneurysm) repair 06/24/2013  . Essential (primary) hypertension 08/17/2011  . Arrhythmia, sinus node 08/17/2011  . History of ventricular fibrillation 08/17/2011  . Systolic CHF with reduced left ventricular function, NYHA class 3 ([place]) 02/26/2010  . 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 Cardiac pacemaker in situ 10/10/2006     Past Surgical History:   Procedure Laterality Date  . ABDOMINAL AORTIC ANEURYSM REPAIR  06/03/2007   [facility]; Dr. Kellie Simmering  . ANGIOPLASTY  1994   MI  . BLADDER TUMOR EXCISION  12/1995  . CATARACT EXTRACTION W/PHACO Left 10/22/2016   Procedure: CATARACT EXTRACTION PHACO AND INTRAOCULAR LENS PLACEMENT (IOC);  Surgeon: Birder Robson, MD;  Location: [facility] ORS;  Service: Ophthalmology;  Laterality: Left;  [place] 47.7 AP% 18.4 CDE 8.78 Fluid pack lot # [id number]  . CATARACT EXTRACTION W/PHACO Right 12/10/2016   Procedure: CATARACT EXTRACTION PHACO AND INTRAOCULAR LENS PLACEMENT (IOC);  Surgeon: Birder Robson, MD;  Location: [facility] ORS;  Service: Ophthalmology;  Laterality: Right;  [place] 00:39 AP% 23.3 CDE 9.13 Fluid pack lot # [id number]  . CORONARY ANGIOPLASTY     STENTS X 5  . CORONARY ARTERY BYPASS GRAFT  09/22/2006   four  . ELBOW BURSA SURGERY     DUE TO GOUT  . INSERT / REPLACE / REMOVE PACEMAKER    . MELANOMA EXCISION  12/2012   Right forearm     Prior to Admission medications   Medication Sig Start Date End Date Taking? Authorizing Provider  allopurinol (ZYLOPRIM) 300 MG tablet Take 2 tablets (600 mg total) by mouth daily. 01/27/19   Birdie Sons, MD  aspirin 81 MG EC tablet Take 81 mg by mouth daily.     [provider]  atorvastatin (LIPITOR) 40 MG tablet TAKE 1 TABLET BY MOUTH EVERY DAY 12/22/18   Birdie Sons, MD  carvedilol (COREG) 12.5 MG tablet TAKE 1 TABLET BY MOUTH DAILY 08/31/19   Birdie Sons, MD  Cyanocobalamin (VITAMIN B-12 CR) 1000 MCG TBCR Take 1,000 mcg by mouth daily.     [provider]  fluticasone (FLONASE) 50 MCG/ACT nasal spray Place 2 sprays into both nostrils daily. 11/08/19 12/08/19  Birdie Sons, MD  indomethacin (INDOCIN) 50 MG capsule Take 1 capsule (50 mg total) by mouth 3 (three) times daily as needed (for gout flares). 01/22/19   Birdie Sons, MD  losartan (COZAAR) 50 MG tablet TAKE 2 TABLETS(100 MG) BY MOUTH DAILY Patient  taking differently: Take 50 mg by mouth daily.  07/08/19   Birdie Sons, MD  pantoprazole (PROTONIX) 40 MG tablet TAKE 1 TABLET BY MOUTH EVERY DAY 04/09/19   Birdie Sons, MD  predniSONE (DELTASONE) 20 MG tablet Take 2 tablets (40 mg total) by mouth daily with breakfast. 11/14/19   Danford, Suann Larry, MD  sulfamethoxazole-trimethoprim (BACTRIM) 400-80 MG tablet Take 1 tablet by mouth every 12 (twelve) hours. 11/14/19   Danford, Suann Larry, MD  torsemide (DEMADEX) 20 MG tablet TAKE 2 TABLETS(40 MG) BY MOUTH DAILY Patient taking differently: Take 20 mg by mouth 2 (two) times daily.  05/25/19   Birdie Sons, MD     Allergies Amlodipine besylate, Crestor [rosuvastatin], and Rocephin [ceftriaxone]   Family History  Problem Relation Age of Onset  . Cancer Mother        Melanoma skin cancer  . Heart attack Father 47  . Cancer Father        throat cancer  .  Arthritis Brother     Social History Social History   Tobacco Use  . Smoking status: Former Smoker    Packs/day: 1.00    Types: Cigarettes    Quit date: 11/26/1999    Years since quitting: 20.0  . Smokeless tobacco: Never Used  Substance Use Topics  . Alcohol use: No  . Drug use: No    Review of Systems  Constitutional:   No fever or chills.  ENT:   No sore throat. No rhinorrhea. Cardiovascular:   No chest pain or syncope. Respiratory:   No dyspnea or cough. Gastrointestinal:   Positive as above for abdominal pain and vomiting. Musculoskeletal:   Negative for focal pain or swelling All other systems reviewed and are negative except as documented above in ROS and HPI.  ____________________________________________   PHYSICAL EXAM:  VITAL SIGNS: ED Triage Vitals  Enc Vitals Group     BP 11/22/19 1618 (!) 145/107     Pulse Rate 11/22/19 1618 60     Resp 11/22/19 1618 (!) 24     Temp 11/22/19 1618 98.4 F (36.9 C)     Temp Source 11/22/19 1618 Oral     SpO2 11/22/19 1618 95 %     Weight 11/22/19 1615  209 lb 7 oz (95 kg)     Height --      Head Circumference --      Peak Flow --      Pain Score 11/22/19 1615 9     Pain Loc --      Pain Edu? --      Excl. in [place]? --     Vital signs reviewed, nursing assessments reviewed.   Constitutional:   Alert and oriented.  Ill-appearing. Eyes:   Conjunctivae are normal. EOMI. PERRL. ENT      Head:   Normocephalic and atraumatic.      Nose:   Wearing a mask.      Mouth/Throat:   Wearing a mask.      Neck:   No meningismus. Full ROM. Hematological/Lymphatic/Immunilogical:   No cervical lymphadenopathy. Cardiovascular:   RRR. Symmetric bilateral radial and DP pulses.  No murmurs. Cap refill less than 2 seconds. Respiratory:   Normal respiratory effort without tachypnea/retractions. Breath sounds are clear and equal bilaterally. No wheezes/rales/rhonchi. Gastrointestinal:   Soft with pronounced epigastric tenderness. Non distended. There is no CVA tenderness.  No rebound, rigidity, or guarding.  No ecchymosis  Musculoskeletal:   Normal range of motion in all extremities. No joint effusions.  No lower extremity tenderness.  No edema. Neurologic:   Normal speech and language.  Motor grossly intact. No acute focal neurologic deficits are appreciated.  Skin:    Skin is warm, dry and intact. No rash noted.  No petechiae, purpura, or bullae.  ____________________________________________    LABS (pertinent positives/negatives) (all labs ordered are listed, but only abnormal results are displayed) Labs Reviewed  CBC WITH DIFFERENTIAL/PLATELET - Abnormal; Notable for the following components:      Result Value   WBC 22.9 (*)    Neutro Abs 19.4 (*)    Monocytes Absolute 1.4 (*)    Abs Immature Granulocytes 0.41 (*)    All other components within normal limits  COMPREHENSIVE METABOLIC PANEL - Abnormal; Notable for the following components:   Chloride 93 (*)    Glucose, Bld 214 (*)    BUN 31 (*)    Creatinine, Ser 1.40 (*)    Albumin 3.2 (*)     Total  Bilirubin 2.1 (*)    GFR calc non Af Amer 48 (*)    GFR calc Af Amer 56 (*)    All other components within normal limits  LIPASE, BLOOD - Abnormal; Notable for the following components:   Lipase 86 (*)    All other components within normal limits  TROPONIN I (HIGH SENSITIVITY) - Abnormal; Notable for the following components:   Troponin I (High Sensitivity) 66 (*)    All other components within normal limits  TROPONIN I (HIGH SENSITIVITY) - Abnormal; Notable for the following components:   Troponin I (High Sensitivity) 65 (*)    All other components within normal limits  URINALYSIS, COMPLETE (UACMP) WITH MICROSCOPIC  LIPID PANEL   ____________________________________________   EKG  Interpreted by me Atrial paced rhythm, rate of 62.  Left axis.  First-degree AV block.  Right bundle branch block.  No acute ischemic changes.  ____________________________________________    RADIOLOGY  CT Abdomen Pelvis W Contrast  Result Date: 11/22/2019 CLINICAL DATA:  Abdominal pain and distension EXAM: CT ABDOMEN AND PELVIS WITH CONTRAST TECHNIQUE: Multidetector CT imaging of the abdomen and pelvis was performed using the standard protocol following bolus administration of intravenous contrast. CONTRAST:  136mL OMNIPAQUE IOHEXOL 300 MG/ML  SOLN COMPARISON:  May 04, 2007 FINDINGS: Lower chest: There is atelectatic change in the lung bases. There are scattered foci of patchy infiltrate in the lung bases, primarily in the right middle and lower lobe regions which are visualized. Pacemaker lead tips are noted in the right atrium and right ventricle. There are foci of coronary artery calcification. Hepatobiliary: There is hepatic steatosis. No focal liver lesions are evident. The gallbladder wall is not appreciably thickened. There is no biliary duct dilatation. Pancreas: There is soft tissue stranding in the head and uncinate process of the pancreas consistent with a degree of acute pancreatitis  in this area. The pancreatic tail and body of the pancreas appear normal. There is no mass or pancreatic duct dilatation. Inflammatory change abuts the mid the distal second portions of the duodenum as well as the proximal third portion of the duodenum. No more distant soft tissue stranding or fluid seen. Spleen: No splenic lesions are evident. Adrenals/Urinary Tract: Adrenals bilaterally appear unremarkable. The left kidney is atrophic with marked cortical thinning. There is compensatory hypertrophy of the right kidney. There is a cyst in the medial mid right kidney measuring 1.3 x 1.3 cm. There is a 5 mm cyst arising from the medial upper pole of the right kidney. There is a 1 x 1 cm solid-appearing mass arising from the upper pole left kidney. There is no evident hydronephrosis on either side. There is no renal or ureteral calculus on either side. Urinary bladder is midline with wall thickness within normal limits. Stomach/Bowel: There are multiple sigmoid and descending colonic diverticula without diverticulitis. There is wall thickening involving much of the second portion of the duodenum in the proximal third portion of the duodenum due to the adjacent pancreatitis. No other bowel wall thickening noted. Terminal ileum appears normal. No evident bowel obstruction. There is no free air or portal venous air. Vascular/Lymphatic: There is a stent in the aorta and common iliac arteries. There is atherosclerotic plaque in the aorta. The current maximum diameter of the aorta at the mid kidney level measures 3.6 x 3.1 cm. There is atherosclerotic calcification and plaque in multiple pelvic arterial vessels. No adenopathy is appreciable in the abdomen or pelvis. Reproductive: Prostate and seminal vesicles appear normal in  size and contour. No pelvic mass evident. Other: Appendix appears normal. No evident abscess or ascites in the abdomen or pelvis. There is fat in each inguinal ring. There is slight fat in the  umbilicus. Musculoskeletal: There is degenerative change in the lumbar spine, most notably at L2-3. There are no blastic or lytic bone lesions. There is mild spinal stenosis at L2-3 due to bony hypertrophy and disc bulging. No intramuscular lesions are evident. IMPRESSION: 1. Evidence of a degree of acute pancreatitis involving the head and uncinate process of the pancreas with localized mesenteric thickening in this region. The body and tail the pancreas appear normal. There is no mass or pseudocyst evident. No pancreatic duct dilatation. No more distant fluid or mesenteric thickening. 2. Wall thickening involving the second and proximal third portions of the duodenum, apparently due to inflammation from nearby pancreatitis. 3. Atrophic left kidney. 1 x 1 cm noncystic appearing mass arises from the upper pole this atrophic left kidney. A small renal cell carcinoma must be of concern. Further evaluation with nonemergent pre and post contrast MRI should be considered. Pre and post contrast CT could alternatively be performed, but would likely be of decreased accuracy given lesion size. 4.  Apparent basilar pneumonia, primarily on the right. 5. Multiple descending colonic and sigmoid diverticula without diverticulitis. No bowel obstruction. No abscess in the abdomen or pelvis. Appendix appears normal. 6. Aortic and iliac artery stent. Maximum current dilatation of the aorta at the mid kidney level measures 3.6 x 3.1 cm. There is no periaortic fluid. There is extensive multilevel arterial vascular calcification. 7. There is a degree of spinal stenosis at L2-3 due to bony hypertrophy and disc bulging. 8.  Hepatic steatosis. Aortic Atherosclerosis (ICD10-I70.0). Electronically Signed   By: Lowella Grip III M.D.   On: 11/22/2019 17:29    ____________________________________________   PROCEDURES Procedures  ____________________________________________  DIFFERENTIAL DIAGNOSIS   Pancreatitis, bowel  obstruction, diverticulitis, intra-abdominal abscess  CLINICAL IMPRESSION / ASSESSMENT AND PLAN / ED COURSE  Medications ordered in the ED: Medications  fentaNYL (SUBLIMAZE) injection 50 mcg (50 mcg Intravenous Given 11/22/19 1850)  morphine 4 MG/ML injection 4 mg (has no administration in time range)  ondansetron (ZOFRAN) injection 4 mg (has no administration in time range)  pantoprazole (PROTONIX) injection 40 mg (has no administration in time range)  lactated ringers bolus 1,000 mL (has no administration in time range)  ondansetron (ZOFRAN) injection 4 mg (4 mg Intravenous Given 11/22/19 1626)  sodium chloride 0.9 % bolus 1,000 mL (1,000 mLs Intravenous New Bag/Given 11/22/19 1628)  iohexol (OMNIPAQUE) 300 MG/ML solution 100 mL (100 mLs Intravenous Contrast Given 11/22/19 1708)    Pertinent labs & imaging results that were available during my care of the patient were reviewed by me and considered in my medical decision making (see chart for details).  ARTYOM STENCEL was evaluated in Emergency Department on 11/22/2019 for the symptoms described in the history of present illness. He was evaluated in the context of the global COVID-19 pandemic, which necessitated consideration that the patient might be at risk for infection with the SARS-CoV-2 virus that causes COVID-19. Institutional protocols and algorithms that pertain to the evaluation of patients at risk for COVID-19 are in a state of rapid change based on information released by regulatory bodies including the CDC and federal and state organizations. These policies and algorithms were followed during the patient's care in the ED.   Patient presents with severe abdominal pain and tenderness.  Labs show a  leukocytosis of 23,000.  CT scan shows pancreatitis.  Lab panel is otherwise unremarkable with normal LFTs and a lipase of 90.  Serial troponin is stable.  Creatinine is improved from usual baseline CKD.  He had some temporary relief with IV  fentanyl given in triage, but still having severe pain.  I will give 4 mg IV morphine, continue IV fluid hydration, plan to admit.  With recent coronavirus IgG +8 days ago, he does not need repeat Covid testing.      ____________________________________________   FINAL CLINICAL IMPRESSION(S) / ED DIAGNOSES    Final diagnoses:  Generalized abdominal pain  Drug-induced acute pancreatitis without infection or necrosis     ED Discharge Orders    None      Portions of this note were generated with dragon dictation software. Dictation errors may occur despite best attempts at proofreading.   Carrie Mew, MD 11/22/19 2022

## 2019-11-22 NOTE — ED Notes (Signed)
Pt is resting in no acute distress.  He is not expressing any needs at this time. Bed is locked in lowest position and call bell is within reach.

## 2019-11-22 NOTE — ED Provider Notes (Signed)
[facility] Emergency Department Provider Note  ____________________________________________   None    (approximate)   I have reviewed the triage vital signs and the nursing notes.   Patient has been triaged with a MSE exam performed by myself at a minimum. Based on symptoms and screening exam, patient may receive a more in-depth exam, labs, imaging as detailed below. Patients have been advised of this setting and exam type at the time of patient interview.    HISTORY  Chief Complaint No chief complaint on file.    HPI Taylor [name] is a 78 y.o. male presents to the emergency department with a complaint of abdominal pain x 2 days.  Patient states significant pain radiating from his abdomen into his back.  No emesis but nausea. Patient states he is having aching pain from his abd to his back. HX abdominal aneurysm that was repaired "in the 90s."  Patient states that he has had no emesis, diarrhea but is unsure of his last bowel movement.  Patient with a significant cardiac history but denies any chest pain.  Patient denies any URI symptoms, fevers or chills, nasal congestion, sore throat, cough.  Patient has a history of AAA, automatic cardio defibrillator, Barrett's esophagus, bladder cancer, coronary artery disease, congestive heart failure, hypertension, hyperlipidemia, MI, pneumonia, chronic kidney disease  Patient will receive a medical screening exam as detailed below.  Based off of this exam, more in depth exam, labs, imaging will be performed as needed for complaint.  Patient care will be eventually transferred to another provider in the emergency department for final exam, diagnosis and disposition.    Past Medical History:  Diagnosis Date  . AAA (abdominal aortic aneurysm) [facility]) 06/03/2007   [facility]; Dr. Kellie Simmering  . AICD (automatic cardioverter/defibrillator) present   . Barrett's esophagus   . Bladder cancer ([place])   .  Bradycardia   . CAD (coronary artery disease)   . CHF (congestive heart failure) ([place])   . Cluster headache   . DDD (degenerative disc disease), lumbar   . Dyspnea    WITH EXERTION  . Edema    LEFT ANKLE  . Fracture of skull base ([place]) 1997   due to fall  . GERD (gastroesophageal reflux disease)   . Gout   . History of bladder cancer 12/1995  . Hyperlipidemia   . Hypertension   . Malignant melanoma ([place]) 12/2012   right dorsal forearm excised  . Myocardial infarction ([place])    LAST 2014  . Osteoarthritis of knee   . Pacemaker 10/10/2006  . Pneumonia    2016  . Pre-diabetes   . Psoriasis   . Rib fracture 1997   due to fall  . Sleep apnea    CPAP  . Stroke ([place])   . Venous incompetence     Patient Active Problem List   Diagnosis Date Noted  . CAP (community acquired pneumonia) 11/13/2019  . Hyperuricemia 03/31/2019  . Numbness 05/07/2018  . Benign colonic polyp 03/24/2017  . Tibialis anterior tenosynovitis 12/21/2015  . Pleural nodule 07/27/2015  . Psoriatic arthritis ([place]) 05/12/2015  . Restrictive lung disease 05/12/2015  . Barrett esophagus 03/30/2015  . Ache in joint 03/30/2015  . Bradycardia 03/30/2015  . Arteriosclerosis of coronary artery 03/30/2015  . Cardiac defibrillator in place 03/30/2015  . Chronic kidney disease (CKD), stage III (moderate) 03/30/2015  . Cluster headache syndrome 03/30/2015  . Degeneration of lumbar or lumbosacral intervertebral disc 03/30/2015  . Gout 03/30/2015  .  Personal history of malignant neoplasm of bladder 03/30/2015  . Hypercholesteremia 03/30/2015  . LBP (low back pain) 03/30/2015  . Personal history of malignant melanoma of skin 03/30/2015  . Muscle ache 03/30/2015  . Arthritis of knee, degenerative 03/30/2015  . Prediabetes 03/30/2015  . Psoriasis 03/30/2015  . Obstructive sleep apnea 03/30/2015  . Chronic venous insufficiency 03/30/2015  . History of abdominal aortic aneurysm (AAA) 06/24/2013  . History of CVA  (cerebrovascular accident) 06/24/2013  . Cardiomyopathy, ischemic 06/24/2013  . History of AAA (abdominal aortic aneurysm) repair 06/24/2013  . Essential (primary) hypertension 08/17/2011  . Arrhythmia, sinus node 08/17/2011  . History of ventricular fibrillation 08/17/2011  . Systolic CHF with reduced left ventricular function, NYHA class 3 ([place]) 02/26/2010  . 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 Cardiac pacemaker in situ 10/10/2006    Past Surgical History:  Procedure Laterality Date  . ABDOMINAL AORTIC ANEURYSM REPAIR  06/03/2007   [facility]; Dr. Kellie Simmering  . ANGIOPLASTY  1994   MI  . BLADDER TUMOR EXCISION  12/1995  . CATARACT EXTRACTION W/PHACO Left 10/22/2016   Procedure: CATARACT EXTRACTION PHACO AND INTRAOCULAR LENS PLACEMENT (IOC);  Surgeon: Birder Robson, MD;  Location: [facility] ORS;  Service: Ophthalmology;  Laterality: Left;  [place] 47.7 AP% 18.4 CDE 8.78 Fluid pack lot # [id number]  . CATARACT EXTRACTION W/PHACO Right 12/10/2016   Procedure: CATARACT EXTRACTION PHACO AND INTRAOCULAR LENS PLACEMENT (IOC);  Surgeon: Birder Robson, MD;  Location: [facility] ORS;  Service: Ophthalmology;  Laterality: Right;  [place] 00:39 AP% 23.3 CDE 9.13 Fluid pack lot # [id number]  . CORONARY ANGIOPLASTY     STENTS X 5  . CORONARY ARTERY BYPASS GRAFT  09/22/2006   four  . ELBOW BURSA SURGERY     DUE TO GOUT  . INSERT / REPLACE / REMOVE PACEMAKER    . MELANOMA EXCISION  12/2012   Right forearm    Prior to Admission medications   Medication Sig Start Date End Date Taking? Authorizing Provider  allopurinol (ZYLOPRIM) 300 MG tablet Take 2 tablets (600 mg total) by mouth daily. 01/27/19   Birdie Sons, MD  aspirin 81 MG EC tablet Take 81 mg by mouth daily.     [provider]  atorvastatin (LIPITOR) 40 MG tablet TAKE 1 TABLET BY MOUTH EVERY DAY 12/22/18   Birdie Sons, MD  carvedilol (COREG) 12.5 MG tablet TAKE 1 TABLET BY MOUTH DAILY  08/31/19   Birdie Sons, MD  Cyanocobalamin (VITAMIN B-12 CR) 1000 MCG TBCR Take 1,000 mcg by mouth daily.     [provider]  fluticasone (FLONASE) 50 MCG/ACT nasal spray Place 2 sprays into both nostrils daily. 11/08/19 12/08/19  Birdie Sons, MD  indomethacin (INDOCIN) 50 MG capsule Take 1 capsule (50 mg total) by mouth 3 (three) times daily as needed (for gout flares). 01/22/19   Birdie Sons, MD  losartan (COZAAR) 50 MG tablet TAKE 2 TABLETS(100 MG) BY MOUTH DAILY Patient taking differently: Take 50 mg by mouth daily.  07/08/19   Birdie Sons, MD  pantoprazole (PROTONIX) 40 MG tablet TAKE 1 TABLET BY MOUTH EVERY DAY 04/09/19   Birdie Sons, MD  predniSONE (DELTASONE) 20 MG tablet Take 2 tablets (40 mg total) by mouth daily with breakfast. 11/14/19   Danford, Suann Larry, MD  sulfamethoxazole-trimethoprim (BACTRIM) 400-80 MG tablet Take 1 tablet by mouth every 12 (twelve) hours. 11/14/19   Edwin Dada, MD  torsemide (DEMADEX) 20  MG tablet TAKE 2 TABLETS(40 MG) BY MOUTH DAILY Patient taking differently: Take 20 mg by mouth 2 (two) times daily.  05/25/19   Birdie Sons, MD    Allergies Amlodipine besylate, Crestor [rosuvastatin], and Rocephin [ceftriaxone]  Family History  Problem Relation Age of Onset  . Cancer Mother        Melanoma skin cancer  . Heart attack Father 20  . Cancer Father        throat cancer  . Arthritis Brother     Social History Social History   Tobacco Use  . Smoking status: Former Smoker    Packs/day: 1.00    Types: Cigarettes    Quit date: 11/26/1999    Years since quitting: 20.0  . Smokeless tobacco: Never Used  Substance Use Topics  . Alcohol use: No  . Drug use: No    Review of Systems Constitutional: no fever ENT: no nasal congestion/rhinorhea. no sore throat Cardiovascular: no chest pain. Respiratory: no cough. no shortness of breath/difficulty breathing Gastroenterology: positive abdominal  pain Musculoskeletal: no for musculoskeletal pain Integumentary: Negative for rash. Neurological: No focal weakness nor numbness.   ____________________________________________   PHYSICAL EXAM:  VITAL SIGNS: ED Triage Vitals  Enc Vitals Group     BP      Pulse      Resp      Temp      Temp src      SpO2      Weight      Height      Head Circumference      Peak Flow      Pain Score      Pain Loc      Pain Edu?      Excl. in [place]?     Constitutional: Alert and oriented. Generally well appearing and in no acute distress. Eyes: Conjunctivae are normal.  Nose: No significant congestion/rhinnorhea. Mouth: No gross oropharyngeal edema. no erythema/edema Neck: No stridor.  No meningeal signs.   Cardiovascular: Grossly normal heart sounds. Respiratory: Normal respiratory effort without significant tachypnea and no observed retractions. Lungs CTAB Gastrointestinal: Abdominal distention visualized with no ecchymosis.    Bowel sounds x4 quadrants.  Diffuse tenderness to palpation. Musculoskeletal: No gross deformities of extremities. Neurologic:  Normal speech and language. No gross focal neurologic deficits are appreciated.  Skin:  Skin is warm, dry and intact. No rash noted.    ____________________________________________   LABS (all labs ordered are listed, but only abnormal results are displayed)  Labs Reviewed - No data to display  ____________________________________________   RADIOLOGY   Official radiology report(s): No results found.  ____________________________________________    INITIAL IMPRESSION / MDM / ASSESSMENT AND PLAN / ED COURSE  As part of my medical decision making, I reviewed the following data within the electronic MEDICAL RECORD NUMBER Notes from prior ED visits and [place] Controlled Substance Database      Clinical Impression: Abdominal pain.  Plan: Patient will have IV established, labs, CT scan ordered at this time.  Patient has been  screened based based on their arrival complaint, evaluated for an emergent condition, and at a minimum has received a medical screening exam.  At this time, patient will receive the further work-up listed above that was determined by medical screening exam.  Patient care will be transferred to another provider in the emergency department once patient is roomed for final diagnosis and disposition.    ____________________________________________  Note:  This document was prepared using Dragon  voice recognition software and may include unintentional dictation errors.    Brynda Peon 11/22/19 1623    Merlyn Lot, MD 11/22/19 [phone]

## 2019-11-22 NOTE — Telephone Encounter (Signed)
Bad stomach cramps- severe- - Began yesterday severe yesterday rates 8/10- pain No BM understated- Dry heaves. last yesterday C/O constipation (pt does not know when last BM was) took laxative- no results.  During call pt was moaning in distress advised pt that 911 needs to be called-pt agreeable- placed pt on hold and [place] 911 notified. Connected 911 to pt to discuss further.  Reason for Disposition . [1] SEVERE pain AND [2] age > 4  Answer Assessment - Initial Assessment Questions 1. LOCATION: "Where does it hurt?"      Did not say Just stated it was his stomach  3. ONSET: "When did the pain begin?" (Minutes, hours or days ago)      yesterday 5. PATTERN "Does the pain come and go, or is it constant?"    - If constant: "Is it getting better, staying the same, or worsening?"      (Note: Constant means the pain never goes away completely; most serious pain is constant and it progresses)     - If intermittent: "How long does it last?" "Do you have pain now?"     (Note: Intermittent means the pain goes away completely between bouts)     Did not ask due to pt in distress 6. SEVERITY: "How bad is the pain?"  (e.g., Scale 1-10; mild, moderate, or severe)    - MILD (1-3): doesn't interfere with normal activities, abdomen soft and not tender to touch     - MODERATE (4-7): interferes with normal activities or awakens from sleep, tender to touch     - SEVERE (8-10): excruciating pain, doubled over, unable to do any normal activities       Severe- pt moaning throughout call 7. RECURRENT SYMPTOM: "Have you ever had this type of abdominal pain before?" If so, ask: "When was the last time?" and "What happened that time?"      Did not ask d/t pt in distress 8. CAUSE: "What do you think is causing the abdominal pain?"     Did not ask 9. RELIEVING/AGGRAVATING FACTORS: "What makes it better or worse?" (e.g., movement, antacids, bowel movement)     Did not ask  10. OTHER SYMPTOMS: "Has there  been any vomiting, diarrhea, constipation, or urine problems?"      Dry heaves, nausea, constipation  Protocols used: ABDOMINAL PAIN - MALE-A-AH

## 2019-11-23 ENCOUNTER — Other Ambulatory Visit: Payer: Self-pay

## 2019-11-23 DIAGNOSIS — R1084 Generalized abdominal pain: Secondary | ICD-10-CM

## 2019-11-23 LAB — COMPREHENSIVE METABOLIC PANEL
ALT: 20 U/L (ref 0–44)
AST: 20 U/L (ref 15–41)
Albumin: 2.7 g/dL — ABNORMAL LOW (ref 3.5–5.0)
Alkaline Phosphatase: 97 U/L (ref 38–126)
Anion gap: 12 (ref 5–15)
BUN: 23 mg/dL (ref 8–23)
CO2: 27 mmol/L (ref 22–32)
Calcium: 8.8 mg/dL — ABNORMAL LOW (ref 8.9–10.3)
Chloride: 101 mmol/L (ref 98–111)
Creatinine, Ser: 1.1 mg/dL (ref 0.61–1.24)
GFR calc Af Amer: >60 mL/min (ref >60)
GFR calc non Af Amer: >60 mL/min (ref >60)
Glucose, Bld: 150 mg/dL — ABNORMAL HIGH (ref 70–99)
Potassium: 4.2 mmol/L (ref 3.5–5.1)
Sodium: 140 mmol/L (ref 135–145)
Total Bilirubin: 1.8 mg/dL — ABNORMAL HIGH (ref 0.3–1.2)
Total Protein: 6.8 g/dL (ref 6.5–8.1)

## 2019-11-23 LAB — CBC
HCT: 47 % (ref 39.0–52.0)
Hemoglobin: 16.2 g/dL (ref 13.0–17.0)
MCH: 32.3 pg (ref 26.0–34.0)
MCHC: 34.5 g/dL (ref 30.0–36.0)
MCV: 93.8 fL (ref 80.0–100.0)
Platelets: 164 10*3/uL (ref 150–400)
RBC: 5.01 MIL/uL (ref 4.22–5.81)
RDW: 15.5 % (ref 11.5–15.5)
WBC: 23.9 10*3/uL — ABNORMAL HIGH (ref 4.0–10.5)
nRBC: 0 % (ref 0.0–0.2)

## 2019-11-23 LAB — TSH: TSH: 1.331 u[IU]/mL (ref 0.350–4.500)

## 2019-11-23 LAB — MAGNESIUM: Magnesium: 2.1 mg/dL (ref 1.7–2.4)

## 2019-11-23 LAB — PHOSPHORUS: Phosphorus: 3.3 mg/dL (ref 2.5–4.6)

## 2019-11-23 LAB — SARS CORONAVIRUS 2 (TAT 6-24 HRS): SARS Coronavirus 2: NEGATIVE

## 2019-11-23 LAB — LIPASE, BLOOD: Lipase: 57 U/L — ABNORMAL HIGH (ref 11–51)

## 2019-11-23 MED ORDER — ENOXAPARIN SODIUM 40 MG/0.4ML ~~LOC~~ SOLN
40.0000 mg | SUBCUTANEOUS | Status: DC
  Administered 2019-11-23 – 2019-11-27 (×5): 40 mg via SUBCUTANEOUS
  Filled 2019-11-23 (×5): qty 0.4

## 2019-11-23 MED ORDER — LABETALOL HCL 5 MG/ML IV SOLN: 5.0000 mg | Freq: Three times a day (TID) | INTRAVENOUS | Status: DC | PRN

## 2019-11-23 MED ORDER — HYDROMORPHONE HCL 1 MG/ML IJ SOLN
0.5000 mg | INTRAMUSCULAR | Status: DC | PRN
  Administered 2019-11-23 – 2019-11-26 (×9): 0.5 mg via INTRAVENOUS
  Filled 2019-11-23 (×9): qty 1

## 2019-11-23 NOTE — Progress Notes (Signed)
OT Cancellation Note  Patient Details Name: Taylor [name] MRN: [id number] DOB: 13-Dec-1940   Cancelled Treatment:    Reason Eval/Treat Not Completed: OT screened, no needs identified, will sign off  OT consult received and chart reviewed. Upon reviewing chart and speaking with PT, it is noted that pt is high functioning with strength, balance and fxl mobility. He does not appear to require OT services at this time. Please re-consult if pt's status changes. Thank you.   Gerrianne Scale, [employer], OTR/L ascom [phone] 11/23/19, 1:51 PM

## 2019-11-23 NOTE — Progress Notes (Signed)
Subjective: Still having abdominal pain this morning.  Was required some pain medicine.  Has been n.p.o.  Wants to eat.  Objective: Vital signs in last 24 hours: Temp:  [97.5 F (36.4 C)-98.5 F (36.9 C)] 97.5 F (36.4 C) (12/29 0809) Pulse Rate:  [46-83] 61 (12/29 0809) Resp:  [18-36] 18 (12/29 0809) BP: (137-201)/(72-107) 161/86 (12/29 0809) SpO2:  [91 %-95 %] 92 % (12/29 1032) Weight:  [92.1 kg-95 kg] 92.1 kg (12/29 0100)  Intake/Output from previous day: 12/28 0701 - 12/29 0700 In: 1235.9 [I.V.:235.9] Out: 500 [Urine:500] Intake/Output this shift: Total I/O In: 0  Out: 300 [Urine:300]  1. General:  in No   Acute distress    Chronically ill -appearing 2. Psychological: Alert and   Oriented 3. Head/ENT:    Dry Mucous Membranes                          Head Non traumatic, neck supple                            Poor Dentition 4. SKIN:  decreased Skin turgor,  Skin clean Dry and intact no rash 5. Heart: Regular rate and rhythm no Murmur, no Rub or gallop 6. Lungs:  no wheezes or crackles   7. Abdomen: Soft, Mild RUQ tender, Non distended;  bowel sounds present; no guarding or rebound tenderness 8. Lower extremities: no clubbing, cyanosis, no edema 9. Neurologically Grossly intact, moving all 4 extremities equally  10. MSK: Normal range of motion  Results for orders placed or performed during the hospital encounter of 11/22/19 (from the past 24 hour(s))  CBC with Differential     Status: Abnormal   Collection Time: 11/22/19  4:23 PM  Result Value Ref Range   WBC 22.9 (H) 4.0 - 10.5 K/uL   RBC 5.26 4.22 - 5.81 MIL/uL   Hemoglobin 16.9 13.0 - 17.0 g/dL   HCT 50.2 39.0 - 52.0 %   MCV 95.4 80.0 - 100.0 fL   MCH 32.1 26.0 - 34.0 pg   MCHC 33.7 30.0 - 36.0 g/dL   RDW 15.2 11.5 - 15.5 %   Platelets 203 150 - 400 K/uL   nRBC 0.0 0.0 - 0.2 %   Neutrophils Relative % 85 %   Neutro Abs 19.4 (H) 1.7 - 7.7 K/uL   Lymphocytes Relative 7 %   Lymphs Abs 1.6 0.7 - 4.0 K/uL    Monocytes Relative 6 %   Monocytes Absolute 1.4 (H) 0.1 - 1.0 K/uL   Eosinophils Relative 0 %   Eosinophils Absolute 0.0 0.0 - 0.5 K/uL   Basophils Relative 0 %   Basophils Absolute 0.1 0.0 - 0.1 K/uL   Immature Granulocytes 2 %   Abs Immature Granulocytes 0.41 (H) 0.00 - 0.07 K/uL  Comprehensive metabolic panel     Status: Abnormal   Collection Time: 11/22/19  4:23 PM  Result Value Ref Range   Sodium 137 135 - 145 mmol/L   Potassium 4.0 3.5 - 5.1 mmol/L   Chloride 93 (L) 98 - 111 mmol/L   CO2 30 22 - 32 mmol/L   Glucose, Bld 214 (H) 70 - 99 mg/dL   BUN 31 (H) 8 - 23 mg/dL   Creatinine, Ser 1.40 (H) 0.61 - 1.24 mg/dL   Calcium 9.0 8.9 - 10.3 mg/dL   Total Protein 7.7 6.5 - 8.1 g/dL   Albumin 3.2 (L) 3.5 -  5.0 g/dL   AST 26 15 - 41 U/L   ALT 28 0 - 44 U/L   Alkaline Phosphatase 100 38 - 126 U/L   Total Bilirubin 2.1 (H) 0.3 - 1.2 mg/dL   GFR calc non Af Amer 48 (L) >60 mL/min   GFR calc Af Amer 56 (L) >60 mL/min   Anion gap 14 5 - 15  Lipase, blood     Status: Abnormal   Collection Time: 11/22/19  4:23 PM  Result Value Ref Range   Lipase 86 (H) 11 - 51 U/L  Troponin I (High Sensitivity)     Status: Abnormal   Collection Time: 11/22/19  4:23 PM  Result Value Ref Range   Troponin I (High Sensitivity) 66 (H) <18 ng/L  Lipid panel     Status: Abnormal   Collection Time: 11/22/19  4:23 PM  Result Value Ref Range   Cholesterol 156 0 - 200 mg/dL   Triglycerides 182 (H) <150 mg/dL   HDL 43 >40 mg/dL   Total CHOL/HDL Ratio 3.6 RATIO   VLDL 36 0 - 40 mg/dL   LDL Cholesterol 77 0 - 99 mg/dL  Troponin I (High Sensitivity)     Status: Abnormal   Collection Time: 11/22/19  6:53 PM  Result Value Ref Range   Troponin I (High Sensitivity) 65 (H) <18 ng/L  Urinalysis, Complete w Microscopic     Status: Abnormal   Collection Time: 11/22/19  9:34 PM  Result Value Ref Range   Color, Urine YELLOW (A) YELLOW   APPearance CLEAR (A) CLEAR   Specific Gravity, Urine 1.043 (H) 1.005 - 1.030    pH 6.0 5.0 - 8.0   Glucose, UA 50 (A) NEGATIVE mg/dL   Hgb urine dipstick NEGATIVE NEGATIVE   Bilirubin Urine NEGATIVE NEGATIVE   Ketones, ur NEGATIVE NEGATIVE mg/dL   Protein, ur 30 (A) NEGATIVE mg/dL   Nitrite NEGATIVE NEGATIVE   Leukocytes,Ua NEGATIVE NEGATIVE   RBC / HPF 0-5 0 - 5 RBC/hpf   WBC, UA 0-5 0 - 5 WBC/hpf   Bacteria, UA NONE SEEN NONE SEEN   Squamous Epithelial / LPF NONE SEEN 0 - 5  Lipase, blood     Status: Abnormal   Collection Time: 11/23/19  4:58 AM  Result Value Ref Range   Lipase 57 (H) 11 - 51 U/L  Magnesium     Status: None   Collection Time: 11/23/19  4:58 AM  Result Value Ref Range   Magnesium 2.1 1.7 - 2.4 mg/dL  Phosphorus     Status: None   Collection Time: 11/23/19  4:58 AM  Result Value Ref Range   Phosphorus 3.3 2.5 - 4.6 mg/dL  TSH     Status: None   Collection Time: 11/23/19  4:58 AM  Result Value Ref Range   TSH 1.331 0.350 - 4.500 uIU/mL  Comprehensive metabolic panel     Status: Abnormal   Collection Time: 11/23/19  4:58 AM  Result Value Ref Range   Sodium 140 135 - 145 mmol/L   Potassium 4.2 3.5 - 5.1 mmol/L   Chloride 101 98 - 111 mmol/L   CO2 27 22 - 32 mmol/L   Glucose, Bld 150 (H) 70 - 99 mg/dL   BUN 23 8 - 23 mg/dL   Creatinine, Ser 1.10 0.61 - 1.24 mg/dL   Calcium 8.8 (L) 8.9 - 10.3 mg/dL   Total Protein 6.8 6.5 - 8.1 g/dL   Albumin 2.7 (L) 3.5 - 5.0 g/dL  AST 20 15 - 41 U/L   ALT 20 0 - 44 U/L   Alkaline Phosphatase 97 38 - 126 U/L   Total Bilirubin 1.8 (H) 0.3 - 1.2 mg/dL   GFR calc non Af Amer >60 >60 mL/min   GFR calc Af Amer >60 >60 mL/min   Anion gap 12 5 - 15  CBC     Status: Abnormal   Collection Time: 11/23/19  4:58 AM  Result Value Ref Range   WBC 23.9 (H) 4.0 - 10.5 K/uL   RBC 5.01 4.22 - 5.81 MIL/uL   Hemoglobin 16.2 13.0 - 17.0 g/dL   HCT 47.0 39.0 - 52.0 %   MCV 93.8 80.0 - 100.0 fL   MCH 32.3 26.0 - 34.0 pg   MCHC 34.5 30.0 - 36.0 g/dL   RDW 15.5 11.5 - 15.5 %   Platelets 164 150 - 400 K/uL    nRBC 0.0 0.0 - 0.2 %    Studies/Results: DG Chest 2 View  Result Date: 11/22/2019 CLINICAL DATA:  Congestion EXAM: CHEST - 2 VIEW COMPARISON:  November 12, 2019 FINDINGS: Again identified are multifocal airspace opacities bilaterally with some progression since the prior study. The heart size remains enlarged. A multi lead left-sided pacemaker is noted. The lungs are hyperexpanded. There is no pneumothorax. No significant pleural effusion. IMPRESSION: Persistent bilateral multifocal airspace opacities with slight progression since the prior studies consistent with an atypical infectious process such as viral pneumonia. Electronically Signed   By: Constance Holster M.D.   On: 11/22/2019 22:51   CT Angio Chest PE W and/or Wo Contrast  Result Date: 11/12/2019 CLINICAL DATA:  Shortness of breath.  Dyspnea. EXAM: CT ANGIOGRAPHY CHEST WITH CONTRAST TECHNIQUE: Multidetector CT imaging of the chest was performed using the standard protocol during bolus administration of intravenous contrast. Multiplanar CT image reconstructions and MIPs were obtained to evaluate the vascular anatomy. CONTRAST:  66mL OMNIPAQUE IOHEXOL 350 MG/ML SOLN COMPARISON:  Radiograph earlier this day.  Chest CTA 07/13/2015 FINDINGS: Cardiovascular: There are no filling defects within the pulmonary arteries to suggest pulmonary embolus. Aortic atherosclerosis. Cannot assess for dissection given phase of contrast tailored to pulmonary artery evaluation. Multi chamber cardiomegaly. Calcification of native coronary arteries, post CABG. Contrast refluxes minimally into the IVC. No pericardial effusion. Left-sided pacemaker in place with the leads in the right atrium and ventricle. Mediastinum/Nodes: Right hilar adenopathy with nodes measuring up to 18 mm short axis. Right subcarinal/infrahilar node measures 10 mm short axis. Multiple prominent right paratracheal nodes, largest 12 mm short axis. There scattered highest mediastinal nodes. Small  prevascular nodes. Calcified left hilar nodes consistent with prior granulomatous disease. Adjacent to the calcified left hilar node is a prominent 9 mm hilar node. Esophagus is patulous. No esophageal wall thickening. No thyroid nodule. Lungs/Pleura: Bilateral pleural nodularity and pleural plaques which have calcified since 2016. No frank pleural effusion. Patchy ground-glass opacities throughout the entire right lung and to a lesser extent left upper and lower lobe. Trace fissural thickening of the minor fissure. Trachea and mainstem bronchi are patent. No dominant pulmonary mass. Upper Abdomen: No acute findings. Musculoskeletal: Post median sternotomy. Stable bone island in lower thoracic vertebra. Remote left rib fractures. There are no acute or suspicious osseous abnormalities. Review of the MIP images confirms the above findings. IMPRESSION: 1. No pulmonary embolus. 2. Patchy ground-glass opacities throughout the right greater than left lung. Findings favor infection, including atypical viral organisms ( COVID-19). Other infectious or non infectious etiologies are also considered,  distribution is not typical of pulmonary edema. 3. Right hilar and mild mediastinal adenopathy. This is likely reactive, however nonspecific. Consider follow-up CT after resolution of symptoms. 4. Diffuse bilateral pleural nodularity which has progressively calcified since 2016. Findings most consistent with prior asbestos exposure. 5. Cardiomegaly. Trace contrast refluxes into the IVC suggesting elevated right heart pressures. 6.  Aortic Atherosclerosis (ICD10-I70.0). Electronically Signed   By: Keith Rake M.D.   On: 11/12/2019 23:29   CT Abdomen Pelvis W Contrast  Result Date: 11/22/2019 CLINICAL DATA:  Abdominal pain and distension EXAM: CT ABDOMEN AND PELVIS WITH CONTRAST TECHNIQUE: Multidetector CT imaging of the abdomen and pelvis was performed using the standard protocol following bolus administration of  intravenous contrast. CONTRAST:  180mL OMNIPAQUE IOHEXOL 300 MG/ML  SOLN COMPARISON:  May 04, 2007 FINDINGS: Lower chest: There is atelectatic change in the lung bases. There are scattered foci of patchy infiltrate in the lung bases, primarily in the right middle and lower lobe regions which are visualized. Pacemaker lead tips are noted in the right atrium and right ventricle. There are foci of coronary artery calcification. Hepatobiliary: There is hepatic steatosis. No focal liver lesions are evident. The gallbladder wall is not appreciably thickened. There is no biliary duct dilatation. Pancreas: There is soft tissue stranding in the head and uncinate process of the pancreas consistent with a degree of acute pancreatitis in this area. The pancreatic tail and body of the pancreas appear normal. There is no mass or pancreatic duct dilatation. Inflammatory change abuts the mid the distal second portions of the duodenum as well as the proximal third portion of the duodenum. No more distant soft tissue stranding or fluid seen. Spleen: No splenic lesions are evident. Adrenals/Urinary Tract: Adrenals bilaterally appear unremarkable. The left kidney is atrophic with marked cortical thinning. There is compensatory hypertrophy of the right kidney. There is a cyst in the medial mid right kidney measuring 1.3 x 1.3 cm. There is a 5 mm cyst arising from the medial upper pole of the right kidney. There is a 1 x 1 cm solid-appearing mass arising from the upper pole left kidney. There is no evident hydronephrosis on either side. There is no renal or ureteral calculus on either side. Urinary bladder is midline with wall thickness within normal limits. Stomach/Bowel: There are multiple sigmoid and descending colonic diverticula without diverticulitis. There is wall thickening involving much of the second portion of the duodenum in the proximal third portion of the duodenum due to the adjacent pancreatitis. No other bowel wall  thickening noted. Terminal ileum appears normal. No evident bowel obstruction. There is no free air or portal venous air. Vascular/Lymphatic: There is a stent in the aorta and common iliac arteries. There is atherosclerotic plaque in the aorta. The current maximum diameter of the aorta at the mid kidney level measures 3.6 x 3.1 cm. There is atherosclerotic calcification and plaque in multiple pelvic arterial vessels. No adenopathy is appreciable in the abdomen or pelvis. Reproductive: Prostate and seminal vesicles appear normal in size and contour. No pelvic mass evident. Other: Appendix appears normal. No evident abscess or ascites in the abdomen or pelvis. There is fat in each inguinal ring. There is slight fat in the umbilicus. Musculoskeletal: There is degenerative change in the lumbar spine, most notably at L2-3. There are no blastic or lytic bone lesions. There is mild spinal stenosis at L2-3 due to bony hypertrophy and disc bulging. No intramuscular lesions are evident. IMPRESSION: 1. Evidence of a degree of  acute pancreatitis involving the head and uncinate process of the pancreas with localized mesenteric thickening in this region. The body and tail the pancreas appear normal. There is no mass or pseudocyst evident. No pancreatic duct dilatation. No more distant fluid or mesenteric thickening. 2. Wall thickening involving the second and proximal third portions of the duodenum, apparently due to inflammation from nearby pancreatitis. 3. Atrophic left kidney. 1 x 1 cm noncystic appearing mass arises from the upper pole this atrophic left kidney. A small renal cell carcinoma must be of concern. Further evaluation with nonemergent pre and post contrast MRI should be considered. Pre and post contrast CT could alternatively be performed, but would likely be of decreased accuracy given lesion size. 4.  Apparent basilar pneumonia, primarily on the right. 5. Multiple descending colonic and sigmoid diverticula  without diverticulitis. No bowel obstruction. No abscess in the abdomen or pelvis. Appendix appears normal. 6. Aortic and iliac artery stent. Maximum current dilatation of the aorta at the mid kidney level measures 3.6 x 3.1 cm. There is no periaortic fluid. There is extensive multilevel arterial vascular calcification. 7. There is a degree of spinal stenosis at L2-3 due to bony hypertrophy and disc bulging. 8.  Hepatic steatosis. Aortic Atherosclerosis (ICD10-I70.0). Electronically Signed   By: Lowella Grip III M.D.   On: 11/22/2019 17:29   DG Chest Port 1 View  Result Date: 11/12/2019 CLINICAL DATA:  Shortness of breath x7 weeks. EXAM: PORTABLE CHEST 1 VIEW COMPARISON:  December 04, 2018 FINDINGS: There are new multifocal airspace opacities bilaterally, greatest in the right mid lung zone. A multi lead left-sided pacemaker is noted with stable appearance of the leads. The patient is status post prior median sternotomy. The inferior-most sternotomy wire is fractured as is the superior most sternotomy wire. The heart size is significantly enlarged. There are multiple old left-sided rib fractures. There is no pneumothorax. There are probable small bilateral pleural effusions. There is likely an old healed right clavicle fracture. IMPRESSION: 1. New multifocal airspace opacities bilaterally, greatest in the right mid lung zone. Findings are suspicious for multifocal pneumonia or developing pulmonary edema. 2. Probable small bilateral pleural effusions. 3. Stable cardiomegaly. Electronically Signed   By: Constance Holster M.D.   On: 11/12/2019 20:17   ECHOCARDIOGRAM COMPLETE  Result Date: 11/13/2019   ECHOCARDIOGRAM REPORT   Patient Name:   OMAREE FUQUA Date of Exam: 11/13/2019 Medical Rec #:  [id number]    Height:       70.0 in Accession #:    [id number]   Weight:       209.8 lb Date of Birth:  1941-04-15   BSA:          2.13 m Patient Age:    26 years     BP:           116/84 mmHg Patient Gender: M             HR:           59 bpm. Exam Location:  [facility] Procedure: 2D Echo, Cardiac Doppler and Color Doppler Indications:     Elevated Troponin  History:         Patient has prior history of Echocardiogram examinations, most                  recent 05/07/2018. CHF; CAD. Loletha Grayer.  Sonographer:     Alyse Low Roar Referring Phys:  [id number] Arvella Merles Trego Diagnosing Phys: Serafina Royals MD  Sonographer Comments: Technically  difficult study due to poor echo windows. IMPRESSIONS  1. Left ventricular ejection fraction, by visual estimation, is 20 to 25%. The left ventricle has severely decreased function. There is no left ventricular hypertrophy.  2. Moderately dilated left ventricular internal cavity size.  3. The left ventricle demonstrates global hypokinesis.  4. Global right ventricle has normal systolic function.The right ventricular size is normal. No increase in right ventricular wall thickness.  5. Left atrial size was mildly dilated.  6. Right atrial size was mildly dilated.  7. The mitral valve is normal in structure. Moderate mitral valve regurgitation.  8. The tricuspid valve is normal in structure. Tricuspid valve regurgitation moderate.  9. The aortic valve is normal in structure. Aortic valve regurgitation is trivial. 10. The pulmonic valve was normal in structure. Pulmonic valve regurgitation is not visualized. 11. Mildly elevated pulmonary artery systolic pressure. FINDINGS  Left Ventricle: Left ventricular ejection fraction, by visual estimation, is 20 to 25%. The left ventricle has severely decreased function. The left ventricle demonstrates global hypokinesis. The left ventricular internal cavity size was moderately dilated left ventricle. There is no left ventricular hypertrophy. Right Ventricle: The right ventricular size is normal. No increase in right ventricular wall thickness. Global RV systolic function is has normal systolic function. The tricuspid regurgitant velocity is 2.24 m/s, and with an assumed right  atrial pressure  of 10 mmHg, the estimated right ventricular systolic pressure is mildly elevated at 30.1 mmHg. Left Atrium: Left atrial size was mildly dilated. Right Atrium: Right atrial size was mildly dilated Pericardium: There is no evidence of pericardial effusion. Mitral Valve: The mitral valve is normal in structure. Moderate mitral valve regurgitation. Tricuspid Valve: The tricuspid valve is normal in structure. Tricuspid valve regurgitation moderate. Aortic Valve: The aortic valve is normal in structure. Aortic valve regurgitation is trivial. Aortic valve mean gradient measures 4.0 mmHg. Aortic valve peak gradient measures 7.5 mmHg. Aortic valve area, by VTI measures 2.13 cm. Pulmonic Valve: The pulmonic valve was normal in structure. Pulmonic valve regurgitation is not visualized. Pulmonic regurgitation is not visualized. Aorta: The aortic root and ascending aorta are structurally normal, with no evidence of dilitation. IAS/Shunts: No atrial level shunt detected by color flow Doppler.  LEFT VENTRICLE PLAX 2D LVIDd:         6.42 cm  Diastology LVIDs:         5.59 cm  LV e' lateral:   7.22 cm/s LV PW:         1.35 cm  LV E/e' lateral: 12.2 LV IVS:        1.20 cm  LV e' medial:    4.38 cm/s LVOT diam:     2.10 cm  LV E/e' medial:  20.2 LV SV:         57 ml LV SV Index:   25.99 LVOT Area:     3.46 cm  RIGHT VENTRICLE TAPSE (M-mode): 1.2 cm LEFT ATRIUM           Index       RIGHT ATRIUM           Index LA diam:      5.60 cm 2.63 cm/m  RA Area:     19.80 cm LA Vol (A4C): 74.4 ml 34.93 ml/m RA Volume:   53.90 ml  25.30 ml/m  AORTIC VALVE                   PULMONIC VALVE AV Area (Vmax):    1.93 cm  PV Vmax:        0.75 m/s AV Area (Vmean):   2.09 cm    PV Peak grad:   2.2 mmHg AV Area (VTI):     2.13 cm    RVOT Peak grad: 1 mmHg AV Vmax:           137.33 cm/s AV Vmean:          88.167 cm/s AV VTI:            0.268 m AV Peak Grad:      7.5 mmHg AV Mean Grad:      4.0 mmHg LVOT Vmax:         76.33 cm/s  LVOT Vmean:        53.167 cm/s LVOT VTI:          0.164 m LVOT/AV VTI ratio: 0.61  AORTA Ao Root diam: 3.50 cm MITRAL VALVE                        TRICUSPID VALVE MV Area (PHT): 4.15 cm             TR Peak grad:   20.1 mmHg MV PHT:        53.07 msec           TR Vmax:        261.00 cm/s MV Decel Time: 183 msec MV E velocity: 88.30 cm/s 103 cm/s  SHUNTS MV A velocity: 64.50 cm/s 70.3 cm/s Systemic VTI:  0.16 m MV E/A ratio:  1.37       1.5       Systemic Diam: 2.10 cm  Serafina Royals MD Electronically signed by Serafina Royals MD Signature Date/Time: 11/13/2019/12:48:45 PM    Final     Scheduled Meds: . allopurinol  300 mg Oral Daily  . atorvastatin  40 mg Oral Daily  . carvedilol  12.5 mg Oral Daily  . enoxaparin (LOVENOX) injection  40 mg Subcutaneous Q24H  . losartan  50 mg Oral Daily  . pantoprazole  40 mg Oral Daily   Continuous Infusions: PRN Meds:acetaminophen **OR** acetaminophen, fentaNYL (SUBLIMAZE) injection, HYDROcodone-acetaminophen, HYDROmorphone (DILAUDID) injection, labetalol, ondansetron **OR** ondansetron (ZOFRAN) IV  Assessment/Plan: 78 y.o. male with medical history significant of likely recent COVID infection AAA, AICD in place CAD CHF GERD hypertension, coronavirus IgG assay positive Bladder cancer, prediabetes sleep apnea on CPAP history of stroke  Admitted for pancreatitis  Present on Admission: . Pancreatitis - improving slowly -Average still has some abdominal pain especially in the light upper and epigastric region -His CT of the abdomen shows just pancreatitis -Lipase 57 now from 86 on admission - most likely cause being   medication induced or idiopathic  CT of abdomen showing acute pancreatitis  no evidence of pseudocyst Status post IV hydration closely due to his severe systolic CHF with a EF of 20 to 25% -We will resume diet with clear liquid and advance slowly; encourage p.o. hydration -Check lipid panel Triglycerides 182 -Discontinued potential offending  agent  Such as bactrim ( pt finished course) Control pain with IV pain medications -Consult GI if patient does not tolerate diet well and continues to have pain without pain medication being given   . Systolic CHF: Currently stable -  with reduced left ventricular function, NYHA class 3 (HCC) sp - Cardiac defibrillator in place - Avoid over aggressive fluid resuscitation will monitor fluid status carefully  .  AKI on chronic kidney disease (CKD), stage  III (moderate) -improved on the day after admission  - avoid nephrotoxic medications -Follow-up labs  . Hypercholesteremia - stable continue home meds if able to tolerate  . Obstructive sleep apnea - no CPAP while had recent COVID infection -Current Covid test is pending -If negative may start CPAP at night  . Suspected COVID-19 virus infection - unclear if fully asymptomatic Given recent admission Continue airborne precautions for now and reassess may need ID consult to determine when safe to dc precautions CXR - showing persistent bilateral infiltrates  Unclear if lagging radiological findings vs still active COVID -COVID-19 test is still pending If becomes respiratory symptomatic may consider remdesivir trial and steroid  . Leukocytosis - in the setting of recent steroids -We will monitor -No fevers or dysuria  Renal  mass - incidental finding but will need to be further worked up on [facility] patient basis once pt is stable  Other plan as per orders.  DVT prophylaxis:   Lovenox  Code Status:  FULL CODE   as per patient    Family Communication:   Family not at  Bedside   Disposition Plan:    To home once workup is complete and patient is stable    LOS: 1 day   [employer]

## 2019-11-23 NOTE — Plan of Care (Signed)
  Problem: Education: Goal: Knowledge of Pancreatitis treatment and prevention will improve Outcome: Progressing   Problem: Health Behavior/Discharge Planning: Goal: Ability to formulate a plan to maintain an alcohol-free life will improve Outcome: Progressing   Problem: Nutritional: Goal: Ability to achieve adequate nutritional intake will improve Outcome: Progressing   Problem: Education: Goal: Knowledge of risk factors and measures for prevention of condition will improve Outcome: Progressing   Problem: Coping: Goal: Psychosocial and spiritual needs will be supported Outcome: Progressing

## 2019-11-23 NOTE — Evaluation (Signed)
Physical Therapy Evaluation Patient Details Name: Taylor [name] MRN: [id number] DOB: Sep 18, 1941 Today's Date: 11/23/2019   History of Present Illness  Pt is a 78 year old M admitted with multifocal pneumonia, respiratory failure and acute kidney injury following c/o dyspnea.  PMH includes cardiac disease, Htn, AICD, MI, rib fractures.  Clinical Impression  Pt is a 78 year old M who lives in a one story home with his wife.  He is independent and works full time at baseline.  Pt independent with bed mobility and STS, able to ambulate 50 ft in room with out a rest break.  His gait was more effortful and with deviations indicating fall risk.  Pt presented with balance deficits during static and dynamic activity requiring narrow BOS.  Mild, self-corrected LOB's and pt stated that he has a cane at home in the event that he would need one.  Pt presented with good overall strength of UE/LE and no report of N/T.  He will continue to benefit from skilled PT with focus on tolerance to activity, gait and balance.    Follow Up Recommendations Outpatient PT    Equipment Recommendations  None recommended by PT    Recommendations for Other Services       Precautions / Restrictions Precautions Precautions: None      Mobility  Bed Mobility Overal bed mobility: Independent                Transfers Overall transfer level: Independent                  Ambulation/Gait Ambulation/Gait assistance: Supervision Gait Distance (Feet): 60 Feet   Gait Pattern/deviations: WFL(Within Functional Limits)   Gait velocity interpretation: >2.62 ft/sec, indicative of community ambulatory General Gait Details: Gait slowed compared to normal with decreased foot clearance and step length.  Two standing rest breaks.  More effortful but with no report of SOB or weakness.  O2 sats: 91-92%.  Stairs            Wheelchair Mobility    Modified Rankin (Stroke Patients Only)       Balance  Overall balance assessment: Needs assistance   Sitting balance-Leahy Scale: Normal       Standing balance-Leahy Scale: Good   Single Leg Stance - Right Leg: 4 Single Leg Stance - Left Leg: 4 Tandem Stance - Right Leg: 3 Tandem Stance - Left Leg: 10 Rhomberg - Eyes Opened: 10 Rhomberg - Eyes Closed: 10   High Level Balance Comments: Able to reach overhead and simulate opening cabinet door bilaterally without UE assist.  Lifts object from floor without UE assist.             Pertinent Vitals/Pain Pain Assessment: No/denies pain    Home Living Family/patient expects to be discharged to:: Private residence Living Arrangements: Spouse/significant other Available Help at Discharge: Family;Available 24 hours/day Type of Home: House Home Access: Stairs to enter Entrance Stairs-Rails: Can reach both Entrance Stairs-Number of Steps: 3 Home Layout: One level Home Equipment: None      Prior Function Level of Independence: Independent         Comments: Independent community ambulator, works part time.  Job is less physical than it was in the past.     Hand Dominance   Dominant Hand: Right    Extremity/Trunk Assessment   Upper Extremity Assessment Upper Extremity Assessment: Overall WFL for tasks assessed(UE grossly 5/5 bilaterally with no N/T reported.)    Lower Extremity Assessment Lower Extremity Assessment:  Overall WFL for tasks assessed(LE strength: grossly 5/5 with no N/T reported.)    Cervical / Trunk Assessment Cervical / Trunk Assessment: Normal  Communication   Communication: No difficulties  Cognition Arousal/Alertness: Awake/alert Behavior During Therapy: WFL for tasks assessed/performed Overall Cognitive Status: Within Functional Limits for tasks assessed                                        General Comments      Exercises Other Exercises Other Exercises: Time to monitor vitals x5 min Other Exercises: Education: benefit of  OP PT, balance training during recovery/return to work x3 min   Assessment/Plan    PT Assessment Patient needs continued PT services  PT Problem List Decreased mobility;Decreased balance;Decreased activity tolerance       PT Treatment Interventions DME instruction;Therapeutic activities;Gait training;Therapeutic exercise;Functional mobility training;Stair training;Balance training    PT Goals (Current goals can be found in the Care Plan section)  Acute Rehab PT Goals Patient Stated Goal: To return to work and generally active day. PT Goal Formulation: With patient Time For Goal Achievement: 12/07/19 Potential to Achieve Goals: Good    Frequency Min 2X/week   Barriers to discharge        Co-evaluation               AM-PAC PT "6 Clicks" Mobility  Outcome Measure Help needed turning from your back to your side while in a flat bed without using bedrails?: None Help needed moving from lying on your back to sitting on the side of a flat bed without using bedrails?: None Help needed moving to and from a bed to a chair (including a wheelchair)?: None Help needed standing up from a chair using your arms (e.g., wheelchair or bedside chair)?: None Help needed to walk in hospital room?: None Help needed climbing 3-5 steps with a railing? : None 6 Click Score: 24    End of Session Equipment Utilized During Treatment: Gait belt Activity Tolerance: Patient tolerated treatment well Patient left: in chair;with call bell/phone within reach Nurse Communication: Mobility status PT Visit Diagnosis: Unsteadiness on feet (R26.81);Other abnormalities of gait and mobility (R26.89)    Time: 7893-8101 PT Time Calculation (min) (ACUTE ONLY): 38 min   Charges:   PT Evaluation $PT Eval Low Complexity: 1 Low PT Treatments $Therapeutic Activity: 8-22 mins        Roxanne Gates, PT, DPT   Roxanne Gates 11/23/2019, 10:45 AM

## 2019-11-23 NOTE — Plan of Care (Signed)
  Problem: Pain Managment: Goal: General experience of comfort will improve Outcome: Not Progressing Note: Patient c/o 8 out of 10 abdominal pain and requiring PRN medication.

## 2019-11-24 ENCOUNTER — Inpatient Hospital Stay: Payer: Medicare Other

## 2019-11-24 DIAGNOSIS — K853 Drug induced acute pancreatitis without necrosis or infection: Principal | ICD-10-CM

## 2019-11-24 DIAGNOSIS — R1011 Right upper quadrant pain: Secondary | ICD-10-CM

## 2019-11-24 LAB — CBC WITH DIFFERENTIAL/PLATELET
Abs Immature Granulocytes: 0.19 10*3/uL — ABNORMAL HIGH (ref 0.00–0.07)
Basophils Absolute: 0 10*3/uL (ref 0.0–0.1)
Basophils Relative: 0 %
Eosinophils Absolute: 0.1 10*3/uL (ref 0.0–0.5)
Eosinophils Relative: 0 %
HCT: 46.8 % (ref 39.0–52.0)
Hemoglobin: 15.7 g/dL (ref 13.0–17.0)
Immature Granulocytes: 1 %
Lymphocytes Relative: 8 %
Lymphs Abs: 1.5 10*3/uL (ref 0.7–4.0)
MCH: 32.9 pg (ref 26.0–34.0)
MCHC: 33.5 g/dL (ref 30.0–36.0)
MCV: 98.1 fL (ref 80.0–100.0)
Monocytes Absolute: 1 10*3/uL (ref 0.1–1.0)
Monocytes Relative: 6 %
Neutro Abs: 15.8 10*3/uL — ABNORMAL HIGH (ref 1.7–7.7)
Neutrophils Relative %: 85 %
Platelets: 158 10*3/uL (ref 150–400)
RBC: 4.77 MIL/uL (ref 4.22–5.81)
RDW: 15.4 % (ref 11.5–15.5)
WBC: 18.6 10*3/uL — ABNORMAL HIGH (ref 4.0–10.5)
nRBC: 0 % (ref 0.0–0.2)

## 2019-11-24 LAB — COMPREHENSIVE METABOLIC PANEL
ALT: 19 U/L (ref 0–44)
AST: 24 U/L (ref 15–41)
Albumin: 2.7 g/dL — ABNORMAL LOW (ref 3.5–5.0)
Alkaline Phosphatase: 80 U/L (ref 38–126)
Anion gap: 10 (ref 5–15)
BUN: 19 mg/dL (ref 8–23)
CO2: 26 mmol/L (ref 22–32)
Calcium: 8.8 mg/dL — ABNORMAL LOW (ref 8.9–10.3)
Chloride: 100 mmol/L (ref 98–111)
Creatinine, Ser: 1.14 mg/dL (ref 0.61–1.24)
GFR calc Af Amer: >60 mL/min (ref >60)
GFR calc non Af Amer: >60 mL/min (ref >60)
Glucose, Bld: 126 mg/dL — ABNORMAL HIGH (ref 70–99)
Potassium: 4.3 mmol/L (ref 3.5–5.1)
Sodium: 136 mmol/L (ref 135–145)
Total Bilirubin: 1.9 mg/dL — ABNORMAL HIGH (ref 0.3–1.2)
Total Protein: 7 g/dL (ref 6.5–8.1)

## 2019-11-24 LAB — BILIRUBIN, DIRECT: Bilirubin, Direct: 0.6 mg/dL — ABNORMAL HIGH (ref 0.0–0.2)

## 2019-11-24 LAB — LIPASE, BLOOD: Lipase: 44 U/L (ref 11–51)

## 2019-11-24 MED ORDER — POLYETHYLENE GLYCOL 3350 17 G PO PACK
17.0000 g | PACK | Freq: Every day | ORAL | Status: DC
  Administered 2019-11-25 – 2019-11-28 (×4): 17 g via ORAL
  Filled 2019-11-24 (×4): qty 1

## 2019-11-24 MED ORDER — BISACODYL 5 MG PO TBEC
10.0000 mg | DELAYED_RELEASE_TABLET | Freq: Once | ORAL | Status: AC
  Administered 2019-11-24: 10 mg via ORAL
  Filled 2019-11-24: qty 2

## 2019-11-24 MED ORDER — LOSARTAN POTASSIUM 50 MG PO TABS
100.0000 mg | ORAL_TABLET | Freq: Every day | ORAL | Status: DC
  Administered 2019-11-25 – 2019-11-27 (×3): 100 mg via ORAL
  Filled 2019-11-24 (×3): qty 2

## 2019-11-24 MED ORDER — SODIUM CHLORIDE 0.9 % IV SOLN: INTRAVENOUS | Status: AC

## 2019-11-24 MED ORDER — LOSARTAN POTASSIUM 50 MG PO TABS: 50.0000 mg | ORAL_TABLET | Freq: Once | ORAL | Status: DC

## 2019-11-24 NOTE — Progress Notes (Addendum)
Subjective: Was having somewhat disproportional abdominal pain this morning as well.  Did have clear liquid diet this morning.  No fever or chills.  Has not had any bowel movement for the past 3 days but passes gas occasionally.  Objective: Vital signs in last 24 hours: Temp:  [97.5 F (36.4 C)-98 F (36.7 C)] 97.8 F (36.6 C) (12/30 1421) Pulse Rate:  [61-72] 61 (12/30 1421) Resp:  [18-20] 18 (12/30 1421) BP: (136-176)/(73-98) 136/73 (12/30 1421) SpO2:  [91 %-97 %] 95 % (12/30 1421) Weight:  [91.8 kg] 91.8 kg (12/30 0400)  Intake/Output from previous day: 12/29 0701 - 12/30 0700 In: 0  Out: 975 [Urine:975] Intake/Output this shift: No intake/output data recorded.  1. General:  in No   Acute distress    Chronically ill -appearing 2. Psychological: Alert and   Oriented 3. Head/ENT:    Dry Mucous Membranes                          Head Non traumatic, neck supple                            Poor Dentition 4. SKIN:  decreased Skin turgor,  Skin clean Dry and intact no rash 5. Heart: Regular rate and rhythm no Murmur, no Rub or gallop 6. Lungs:  no wheezes or crackles   7. Abdomen: Soft, Mild RUQ and right lower quadrant tender, Non distended;  bowel sounds present; no guarding or rebound tenderness 8. Lower extremities: no clubbing, cyanosis, no edema 9. Neurologically Grossly intact, moving all 4 extremities equally  10. MSK: Normal range of motion  Results for orders placed or performed during the hospital encounter of 11/22/19 (from the past 24 hour(s))  CBC with Differential/Platelet     Status: Abnormal   Collection Time: 11/24/19  8:08 AM  Result Value Ref Range   WBC 18.6 (H) 4.0 - 10.5 K/uL   RBC 4.77 4.22 - 5.81 MIL/uL   Hemoglobin 15.7 13.0 - 17.0 g/dL   HCT 46.8 39.0 - 52.0 %   MCV 98.1 80.0 - 100.0 fL   MCH 32.9 26.0 - 34.0 pg   MCHC 33.5 30.0 - 36.0 g/dL   RDW 15.4 11.5 - 15.5 %   Platelets 158 150 - 400 K/uL   nRBC 0.0 0.0 - 0.2 %   Neutrophils Relative % 85  %   Neutro Abs 15.8 (H) 1.7 - 7.7 K/uL   Lymphocytes Relative 8 %   Lymphs Abs 1.5 0.7 - 4.0 K/uL   Monocytes Relative 6 %   Monocytes Absolute 1.0 0.1 - 1.0 K/uL   Eosinophils Relative 0 %   Eosinophils Absolute 0.1 0.0 - 0.5 K/uL   Basophils Relative 0 %   Basophils Absolute 0.0 0.0 - 0.1 K/uL   Immature Granulocytes 1 %   Abs Immature Granulocytes 0.19 (H) 0.00 - 0.07 K/uL  Lipase, blood     Status: None   Collection Time: 11/24/19  8:08 AM  Result Value Ref Range   Lipase 44 11 - 51 U/L  Comprehensive metabolic panel     Status: Abnormal   Collection Time: 11/24/19  8:08 AM  Result Value Ref Range   Sodium 136 135 - 145 mmol/L   Potassium 4.3 3.5 - 5.1 mmol/L   Chloride 100 98 - 111 mmol/L   CO2 26 22 - 32 mmol/L   Glucose, Bld 126 (H) 70 -  99 mg/dL   BUN 19 8 - 23 mg/dL   Creatinine, Ser 1.14 0.61 - 1.24 mg/dL   Calcium 8.8 (L) 8.9 - 10.3 mg/dL   Total Protein 7.0 6.5 - 8.1 g/dL   Albumin 2.7 (L) 3.5 - 5.0 g/dL   AST 24 15 - 41 U/L   ALT 19 0 - 44 U/L   Alkaline Phosphatase 80 38 - 126 U/L   Total Bilirubin 1.9 (H) 0.3 - 1.2 mg/dL   GFR calc non Af Amer >60 >60 mL/min   GFR calc Af Amer >60 >60 mL/min   Anion gap 10 5 - 15  Bilirubin, direct     Status: Abnormal   Collection Time: 11/24/19  2:56 PM  Result Value Ref Range   Bilirubin, Direct 0.6 (H) 0.0 - 0.2 mg/dL    Studies/Results: DG Chest 2 View  Result Date: 11/22/2019 CLINICAL DATA:  Congestion EXAM: CHEST - 2 VIEW COMPARISON:  November 12, 2019 FINDINGS: Again identified are multifocal airspace opacities bilaterally with some progression since the prior study. The heart size remains enlarged. A multi lead left-sided pacemaker is noted. The lungs are hyperexpanded. There is no pneumothorax. No significant pleural effusion. IMPRESSION: Persistent bilateral multifocal airspace opacities with slight progression since the prior studies consistent with an atypical infectious process such as viral pneumonia.  Electronically Signed   By: Constance Holster M.D.   On: 11/22/2019 22:51   DG Abd 1 View  Result Date: 11/24/2019 CLINICAL DATA:  Abdominal pain for 3 days. EXAM: ABDOMEN - 1 VIEW COMPARISON:  Plain films of the abdomen 10/01/2005. FINDINGS: The bowel gas pattern is nonobstructive. There is mild gaseous distention of the transverse colon and a moderately large volume of stool in the ascending colon. No abnormal abdominal calcification or acute bony abnormality is seen. Aortic stent graft is noted. IMPRESSION: No acute abnormality. Moderately large volume of stool in the ascending colon. Electronically Signed   By: Inge Rise M.D.   On: 11/24/2019 10:16   CT Angio Chest PE W and/or Wo Contrast  Result Date: 11/12/2019 CLINICAL DATA:  Shortness of breath.  Dyspnea. EXAM: CT ANGIOGRAPHY CHEST WITH CONTRAST TECHNIQUE: Multidetector CT imaging of the chest was performed using the standard protocol during bolus administration of intravenous contrast. Multiplanar CT image reconstructions and MIPs were obtained to evaluate the vascular anatomy. CONTRAST:  54mL OMNIPAQUE IOHEXOL 350 MG/ML SOLN COMPARISON:  Radiograph earlier this day.  Chest CTA 07/13/2015 FINDINGS: Cardiovascular: There are no filling defects within the pulmonary arteries to suggest pulmonary embolus. Aortic atherosclerosis. Cannot assess for dissection given phase of contrast tailored to pulmonary artery evaluation. Multi chamber cardiomegaly. Calcification of native coronary arteries, post CABG. Contrast refluxes minimally into the IVC. No pericardial effusion. Left-sided pacemaker in place with the leads in the right atrium and ventricle. Mediastinum/Nodes: Right hilar adenopathy with nodes measuring up to 18 mm short axis. Right subcarinal/infrahilar node measures 10 mm short axis. Multiple prominent right paratracheal nodes, largest 12 mm short axis. There scattered highest mediastinal nodes. Small prevascular nodes. Calcified left  hilar nodes consistent with prior granulomatous disease. Adjacent to the calcified left hilar node is a prominent 9 mm hilar node. Esophagus is patulous. No esophageal wall thickening. No thyroid nodule. Lungs/Pleura: Bilateral pleural nodularity and pleural plaques which have calcified since 2016. No frank pleural effusion. Patchy ground-glass opacities throughout the entire right lung and to a lesser extent left upper and lower lobe. Trace fissural thickening of the minor fissure. Trachea and mainstem  bronchi are patent. No dominant pulmonary mass. Upper Abdomen: No acute findings. Musculoskeletal: Post median sternotomy. Stable bone island in lower thoracic vertebra. Remote left rib fractures. There are no acute or suspicious osseous abnormalities. Review of the MIP images confirms the above findings. IMPRESSION: 1. No pulmonary embolus. 2. Patchy ground-glass opacities throughout the right greater than left lung. Findings favor infection, including atypical viral organisms ( COVID-19). Other infectious or non infectious etiologies are also considered, distribution is not typical of pulmonary edema. 3. Right hilar and mild mediastinal adenopathy. This is likely reactive, however nonspecific. Consider follow-up CT after resolution of symptoms. 4. Diffuse bilateral pleural nodularity which has progressively calcified since 2016. Findings most consistent with prior asbestos exposure. 5. Cardiomegaly. Trace contrast refluxes into the IVC suggesting elevated right heart pressures. 6.  Aortic Atherosclerosis (ICD10-I70.0). Electronically Signed   By: Keith Rake M.D.   On: 11/12/2019 23:29   CT Abdomen Pelvis W Contrast  Result Date: 11/22/2019 CLINICAL DATA:  Abdominal pain and distension EXAM: CT ABDOMEN AND PELVIS WITH CONTRAST TECHNIQUE: Multidetector CT imaging of the abdomen and pelvis was performed using the standard protocol following bolus administration of intravenous contrast. CONTRAST:  192mL  OMNIPAQUE IOHEXOL 300 MG/ML  SOLN COMPARISON:  May 04, 2007 FINDINGS: Lower chest: There is atelectatic change in the lung bases. There are scattered foci of patchy infiltrate in the lung bases, primarily in the right middle and lower lobe regions which are visualized. Pacemaker lead tips are noted in the right atrium and right ventricle. There are foci of coronary artery calcification. Hepatobiliary: There is hepatic steatosis. No focal liver lesions are evident. The gallbladder wall is not appreciably thickened. There is no biliary duct dilatation. Pancreas: There is soft tissue stranding in the head and uncinate process of the pancreas consistent with a degree of acute pancreatitis in this area. The pancreatic tail and body of the pancreas appear normal. There is no mass or pancreatic duct dilatation. Inflammatory change abuts the mid the distal second portions of the duodenum as well as the proximal third portion of the duodenum. No more distant soft tissue stranding or fluid seen. Spleen: No splenic lesions are evident. Adrenals/Urinary Tract: Adrenals bilaterally appear unremarkable. The left kidney is atrophic with marked cortical thinning. There is compensatory hypertrophy of the right kidney. There is a cyst in the medial mid right kidney measuring 1.3 x 1.3 cm. There is a 5 mm cyst arising from the medial upper pole of the right kidney. There is a 1 x 1 cm solid-appearing mass arising from the upper pole left kidney. There is no evident hydronephrosis on either side. There is no renal or ureteral calculus on either side. Urinary bladder is midline with wall thickness within normal limits. Stomach/Bowel: There are multiple sigmoid and descending colonic diverticula without diverticulitis. There is wall thickening involving much of the second portion of the duodenum in the proximal third portion of the duodenum due to the adjacent pancreatitis. No other bowel wall thickening noted. Terminal ileum appears  normal. No evident bowel obstruction. There is no free air or portal venous air. Vascular/Lymphatic: There is a stent in the aorta and common iliac arteries. There is atherosclerotic plaque in the aorta. The current maximum diameter of the aorta at the mid kidney level measures 3.6 x 3.1 cm. There is atherosclerotic calcification and plaque in multiple pelvic arterial vessels. No adenopathy is appreciable in the abdomen or pelvis. Reproductive: Prostate and seminal vesicles appear normal in size and contour. No  pelvic mass evident. Other: Appendix appears normal. No evident abscess or ascites in the abdomen or pelvis. There is fat in each inguinal ring. There is slight fat in the umbilicus. Musculoskeletal: There is degenerative change in the lumbar spine, most notably at L2-3. There are no blastic or lytic bone lesions. There is mild spinal stenosis at L2-3 due to bony hypertrophy and disc bulging. No intramuscular lesions are evident. IMPRESSION: 1. Evidence of a degree of acute pancreatitis involving the head and uncinate process of the pancreas with localized mesenteric thickening in this region. The body and tail the pancreas appear normal. There is no mass or pseudocyst evident. No pancreatic duct dilatation. No more distant fluid or mesenteric thickening. 2. Wall thickening involving the second and proximal third portions of the duodenum, apparently due to inflammation from nearby pancreatitis. 3. Atrophic left kidney. 1 x 1 cm noncystic appearing mass arises from the upper pole this atrophic left kidney. A small renal cell carcinoma must be of concern. Further evaluation with nonemergent pre and post contrast MRI should be considered. Pre and post contrast CT could alternatively be performed, but would likely be of decreased accuracy given lesion size. 4.  Apparent basilar pneumonia, primarily on the right. 5. Multiple descending colonic and sigmoid diverticula without diverticulitis. No bowel obstruction.  No abscess in the abdomen or pelvis. Appendix appears normal. 6. Aortic and iliac artery stent. Maximum current dilatation of the aorta at the mid kidney level measures 3.6 x 3.1 cm. There is no periaortic fluid. There is extensive multilevel arterial vascular calcification. 7. There is a degree of spinal stenosis at L2-3 due to bony hypertrophy and disc bulging. 8.  Hepatic steatosis. Aortic Atherosclerosis (ICD10-I70.0). Electronically Signed   By: Lowella Grip III M.D.   On: 11/22/2019 17:29   DG Chest Port 1 View  Result Date: 11/12/2019 CLINICAL DATA:  Shortness of breath x7 weeks. EXAM: PORTABLE CHEST 1 VIEW COMPARISON:  December 04, 2018 FINDINGS: There are new multifocal airspace opacities bilaterally, greatest in the right mid lung zone. A multi lead left-sided pacemaker is noted with stable appearance of the leads. The patient is status post prior median sternotomy. The inferior-most sternotomy wire is fractured as is the superior most sternotomy wire. The heart size is significantly enlarged. There are multiple old left-sided rib fractures. There is no pneumothorax. There are probable small bilateral pleural effusions. There is likely an old healed right clavicle fracture. IMPRESSION: 1. New multifocal airspace opacities bilaterally, greatest in the right mid lung zone. Findings are suspicious for multifocal pneumonia or developing pulmonary edema. 2. Probable small bilateral pleural effusions. 3. Stable cardiomegaly. Electronically Signed   By: Constance Holster M.D.   On: 11/12/2019 20:17   ECHOCARDIOGRAM COMPLETE  Result Date: 11/13/2019   ECHOCARDIOGRAM REPORT   Patient Name:   Taylor [name] Date of Exam: 11/13/2019 Medical Rec #:  [id number]    Height:       70.0 in Accession #:    [id number]   Weight:       209.8 lb Date of Birth:  Jun 09, 1941   BSA:          2.13 m Patient Age:    81 years     BP:           116/84 mmHg Patient Gender: M            HR:           59 bpm. Exam Location:   [facility] Procedure:  2D Echo, Cardiac Doppler and Color Doppler Indications:     Elevated Troponin  History:         Patient has prior history of Echocardiogram examinations, most                  recent 05/07/2018. CHF; CAD. Loletha Grayer.  Sonographer:     Alyse Low Roar Referring Phys:  [id number] Lorenzo Diagnosing Phys: Serafina Royals MD  Sonographer Comments: Technically difficult study due to poor echo windows. IMPRESSIONS  1. Left ventricular ejection fraction, by visual estimation, is 20 to 25%. The left ventricle has severely decreased function. There is no left ventricular hypertrophy.  2. Moderately dilated left ventricular internal cavity size.  3. The left ventricle demonstrates global hypokinesis.  4. Global right ventricle has normal systolic function.The right ventricular size is normal. No increase in right ventricular wall thickness.  5. Left atrial size was mildly dilated.  6. Right atrial size was mildly dilated.  7. The mitral valve is normal in structure. Moderate mitral valve regurgitation.  8. The tricuspid valve is normal in structure. Tricuspid valve regurgitation moderate.  9. The aortic valve is normal in structure. Aortic valve regurgitation is trivial. 10. The pulmonic valve was normal in structure. Pulmonic valve regurgitation is not visualized. 11. Mildly elevated pulmonary artery systolic pressure. FINDINGS  Left Ventricle: Left ventricular ejection fraction, by visual estimation, is 20 to 25%. The left ventricle has severely decreased function. The left ventricle demonstrates global hypokinesis. The left ventricular internal cavity size was moderately dilated left ventricle. There is no left ventricular hypertrophy. Right Ventricle: The right ventricular size is normal. No increase in right ventricular wall thickness. Global RV systolic function is has normal systolic function. The tricuspid regurgitant velocity is 2.24 m/s, and with an assumed right atrial pressure  of 10 mmHg, the estimated  right ventricular systolic pressure is mildly elevated at 30.1 mmHg. Left Atrium: Left atrial size was mildly dilated. Right Atrium: Right atrial size was mildly dilated Pericardium: There is no evidence of pericardial effusion. Mitral Valve: The mitral valve is normal in structure. Moderate mitral valve regurgitation. Tricuspid Valve: The tricuspid valve is normal in structure. Tricuspid valve regurgitation moderate. Aortic Valve: The aortic valve is normal in structure. Aortic valve regurgitation is trivial. Aortic valve mean gradient measures 4.0 mmHg. Aortic valve peak gradient measures 7.5 mmHg. Aortic valve area, by VTI measures 2.13 cm. Pulmonic Valve: The pulmonic valve was normal in structure. Pulmonic valve regurgitation is not visualized. Pulmonic regurgitation is not visualized. Aorta: The aortic root and ascending aorta are structurally normal, with no evidence of dilitation. IAS/Shunts: No atrial level shunt detected by color flow Doppler.  LEFT VENTRICLE PLAX 2D LVIDd:         6.42 cm  Diastology LVIDs:         5.59 cm  LV e' lateral:   7.22 cm/s LV PW:         1.35 cm  LV E/e' lateral: 12.2 LV IVS:        1.20 cm  LV e' medial:    4.38 cm/s LVOT diam:     2.10 cm  LV E/e' medial:  20.2 LV SV:         57 ml LV SV Index:   25.99 LVOT Area:     3.46 cm  RIGHT VENTRICLE TAPSE (M-mode): 1.2 cm LEFT ATRIUM           Index       RIGHT ATRIUM  Index LA diam:      5.60 cm 2.63 cm/m  RA Area:     19.80 cm LA Vol (A4C): 74.4 ml 34.93 ml/m RA Volume:   53.90 ml  25.30 ml/m  AORTIC VALVE                   PULMONIC VALVE AV Area (Vmax):    1.93 cm    PV Vmax:        0.75 m/s AV Area (Vmean):   2.09 cm    PV Peak grad:   2.2 mmHg AV Area (VTI):     2.13 cm    RVOT Peak grad: 1 mmHg AV Vmax:           137.33 cm/s AV Vmean:          88.167 cm/s AV VTI:            0.268 m AV Peak Grad:      7.5 mmHg AV Mean Grad:      4.0 mmHg LVOT Vmax:         76.33 cm/s LVOT Vmean:        53.167 cm/s LVOT VTI:           0.164 m LVOT/AV VTI ratio: 0.61  AORTA Ao Root diam: 3.50 cm MITRAL VALVE                        TRICUSPID VALVE MV Area (PHT): 4.15 cm             TR Peak grad:   20.1 mmHg MV PHT:        53.07 msec           TR Vmax:        261.00 cm/s MV Decel Time: 183 msec MV E velocity: 88.30 cm/s 103 cm/s  SHUNTS MV A velocity: 64.50 cm/s 70.3 cm/s Systemic VTI:  0.16 m MV E/A ratio:  1.37       1.5       Systemic Diam: 2.10 cm  Serafina Royals MD Electronically signed by Serafina Royals MD Signature Date/Time: 11/13/2019/12:48:45 PM    Final     Scheduled Meds: . allopurinol  300 mg Oral Daily  . atorvastatin  40 mg Oral Daily  . carvedilol  12.5 mg Oral Daily  . enoxaparin (LOVENOX) injection  40 mg Subcutaneous Q24H  . [START ON 11/25/2019] losartan  100 mg Oral Daily  . pantoprazole  40 mg Oral Daily  . [START ON 11/25/2019] polyethylene glycol  17 g Oral Daily   Continuous Infusions: . sodium chloride 50 mL/hr at 11/24/19 1453   PRN Meds:acetaminophen **OR** acetaminophen, fentaNYL (SUBLIMAZE) injection, HYDROcodone-acetaminophen, HYDROmorphone (DILAUDID) injection, labetalol, ondansetron **OR** ondansetron (ZOFRAN) IV  Assessment/Plan: 78 y.o. male with medical history significant of likely recent COVID infection AAA, AICD in place CAD CHF GERD hypertension, coronavirus IgG assay positive Bladder cancer, prediabetes sleep apnea on CPAP history of stroke  Admitted for pancreatitis  Present on Admission: . Pancreatitis - lipase improved to normal -But still has some abdominal pain especially in the light upper and lower quadrants-His CT of the abdomen shows just pancreatitis - most likely cause of pancreatitis being   medication induced or idiopathic  CT of abdomen showing acute pancreatitis  no evidence of pseudocyst Status post IV hydration closely due to his severe systolic CHF with a EF of 20 to 25%.  Patient also has implanted -Consulted GI service due  to patient's still experiencing  disproportional abdominal pain -We will check direct bilirubin bilirubin if it is elevated then consider further work-up.  However patient has a defibrillator implanted -Abdominal x-ray this morning shows no ileus or obstruction but large amount of stool -We will start bowel regimen as per GI recommendation -Also resume diet with full liquid diet and will advance to low and low-fat diet if patient tolerates -Check lipid panel Triglycerides 182 -Discontinued potential offending agent  Such as bactrim ( pt finished course) Control pain with IV pain medications -Appreciate GI recommendation   . Systolic CHF: Currently stable -  with reduced left ventricular function, NYHA class 3 (HCC) sp - Cardiac defibrillator in place - Avoid over aggressive fluid resuscitation will monitor fluid status carefully  .  AKI on chronic kidney disease (CKD), stage III (moderate) -improved on the day after admission  - avoid nephrotoxic medications -Follow-up labs  . Hypercholesteremia - stable continue home meds if able to tolerate  . Obstructive sleep apnea - no CPAP while had recent COVID infection -Current Covid test  came back negative on 11/23/2019 -If negative may start CPAP at night  . Suspected COVID-19 virus infection -does not have fever or shortness of breath or any URI symptoms - unclear if fully asymptomatic Given recent admission Continue airborne precautions for now and reassess may need ID consult to determine when safe to dc precautions CXR - showing persistent bilateral infiltrates  Unclear if lagging radiological findings vs still active COVID -COVID-19 test came back negative on 11/23/2019  . Leukocytosis - in the setting of recent steroids -improving -We will monitor -No fevers or dysuria  Renal  mass - incidental finding but will need to be further worked up on [facility] patient basis once pt is stable  Other plan as per orders.  DVT prophylaxis:   Lovenox  Code  Status:  FULL CODE   as per patient    Family Communication:   Family not at  Bedside   Disposition Plan:    To home once workup is complete and patient is stable    LOS: 2 days   Dalary Hollar Izetta Dakin

## 2019-11-24 NOTE — Progress Notes (Signed)
Subjective: Still having abdominal pain this morning.  Was required some pain medicine. Was kept n.p.o. since midnight last night. Resume diet this morning. Has not had a bowel movement yet despite receiving Dulcolax 1 dose p.o. yesterday and MiraLAX scheduled dose this morning.  Objective: Vital signs in last 24 hours: Temp:  [97.4 F (36.3 C)-98.8 F (37.1 C)] 97.8 F (36.6 C) (12/31 0824) Pulse Rate:  [59-61] 61 (12/31 0824) Resp:  [18-20] 20 (12/31 0824) BP: (159-167)/(77-96) 162/94 (12/31 0824) SpO2:  [90 %-96 %] 90 % (12/31 0824)  Intake/Output from previous day: 12/30 0701 - 12/31 0700 In: 150 [I.V.:150] Out: 50 [Urine:50] Intake/Output this shift: No intake/output data recorded.  1. General:  in No   Acute distress    Chronically ill -appearing 2. Psychological: Alert and   Oriented 3. Head/ENT:    Dry Mucous Membranes                          Head Non traumatic, neck supple                            Poor Dentition 4. SKIN:  decreased Skin turgor,  Skin clean Dry and intact no rash 5. Heart: Regular rate and rhythm no Murmur, no Rub or gallop 6. Lungs:  no wheezes or crackles   7. Abdomen: Soft, RUQ tender, Non distended;  bowel sounds present; no guarding or rebound tenderness 8. Lower extremities: no clubbing, cyanosis, no edema 9. Neurologically Grossly intact, moving all 4 extremities equally  10. MSK: Normal range of motion  Results for orders placed or performed during the hospital encounter of 11/22/19 (from the past 24 hour(s))  CBC with Differential/Platelet     Status: Abnormal   Collection Time: 11/25/19  5:16 AM  Result Value Ref Range   WBC 12.4 (H) 4.0 - 10.5 K/uL   RBC 4.74 4.22 - 5.81 MIL/uL   Hemoglobin 15.0 13.0 - 17.0 g/dL   HCT 46.2 39.0 - 52.0 %   MCV 97.5 80.0 - 100.0 fL   MCH 31.6 26.0 - 34.0 pg   MCHC 32.5 30.0 - 36.0 g/dL   RDW 15.1 11.5 - 15.5 %   Platelets 155 150 - 400 K/uL   nRBC 0.0 0.0 - 0.2 %   Neutrophils Relative % 81 %    Neutro Abs 10.0 (H) 1.7 - 7.7 K/uL   Lymphocytes Relative 11 %   Lymphs Abs 1.3 0.7 - 4.0 K/uL   Monocytes Relative 6 %   Monocytes Absolute 0.8 0.1 - 1.0 K/uL   Eosinophils Relative 1 %   Eosinophils Absolute 0.1 0.0 - 0.5 K/uL   Basophils Relative 0 %   Basophils Absolute 0.0 0.0 - 0.1 K/uL   Immature Granulocytes 1 %   Abs Immature Granulocytes 0.12 (H) 0.00 - 0.07 K/uL  Hepatic function panel     Status: Abnormal   Collection Time: 11/25/19  5:16 AM  Result Value Ref Range   Total Protein 6.7 6.5 - 8.1 g/dL   Albumin 2.4 (L) 3.5 - 5.0 g/dL   AST 28 15 - 41 U/L   ALT 21 0 - 44 U/L   Alkaline Phosphatase 87 38 - 126 U/L   Total Bilirubin 1.6 (H) 0.3 - 1.2 mg/dL   Bilirubin, Direct 0.5 (H) 0.0 - 0.2 mg/dL   Indirect Bilirubin 1.1 (H) 0.3 - 0.9 mg/dL  Basic metabolic panel  Status: Abnormal   Collection Time: 11/25/19  5:16 AM  Result Value Ref Range   Sodium 137 135 - 145 mmol/L   Potassium 4.0 3.5 - 5.1 mmol/L   Chloride 101 98 - 111 mmol/L   CO2 29 22 - 32 mmol/L   Glucose, Bld 112 (H) 70 - 99 mg/dL   BUN 20 8 - 23 mg/dL   Creatinine, Ser 1.06 0.61 - 1.24 mg/dL   Calcium 8.8 (L) 8.9 - 10.3 mg/dL   GFR calc non Af Amer >60 >60 mL/min   GFR calc Af Amer >60 >60 mL/min   Anion gap 7 5 - 15  Magnesium     Status: None   Collection Time: 11/25/19  5:16 AM  Result Value Ref Range   Magnesium 1.9 1.7 - 2.4 mg/dL  Lipase, blood     Status: None   Collection Time: 11/25/19  5:16 AM  Result Value Ref Range   Lipase 38 11 - 51 U/L    Studies/Results: DG Chest 2 View  Result Date: 11/22/2019 CLINICAL DATA:  Congestion EXAM: CHEST - 2 VIEW COMPARISON:  November 12, 2019 FINDINGS: Again identified are multifocal airspace opacities bilaterally with some progression since the prior study. The heart size remains enlarged. A multi lead left-sided pacemaker is noted. The lungs are hyperexpanded. There is no pneumothorax. No significant pleural effusion. IMPRESSION: Persistent  bilateral multifocal airspace opacities with slight progression since the prior studies consistent with an atypical infectious process such as viral pneumonia. Electronically Signed   By: Constance Holster M.D.   On: 11/22/2019 22:51   DG Abd 1 View  Result Date: 11/24/2019 CLINICAL DATA:  Abdominal pain for 3 days. EXAM: ABDOMEN - 1 VIEW COMPARISON:  Plain films of the abdomen 10/01/2005. FINDINGS: The bowel gas pattern is nonobstructive. There is mild gaseous distention of the transverse colon and a moderately large volume of stool in the ascending colon. No abnormal abdominal calcification or acute bony abnormality is seen. Aortic stent graft is noted. IMPRESSION: No acute abnormality. Moderately large volume of stool in the ascending colon. Electronically Signed   By: Inge Rise M.D.   On: 11/24/2019 10:16   CT Angio Chest PE W and/or Wo Contrast  Result Date: 11/12/2019 CLINICAL DATA:  Shortness of breath.  Dyspnea. EXAM: CT ANGIOGRAPHY CHEST WITH CONTRAST TECHNIQUE: Multidetector CT imaging of the chest was performed using the standard protocol during bolus administration of intravenous contrast. Multiplanar CT image reconstructions and MIPs were obtained to evaluate the vascular anatomy. CONTRAST:  34mL OMNIPAQUE IOHEXOL 350 MG/ML SOLN COMPARISON:  Radiograph earlier this day.  Chest CTA 07/13/2015 FINDINGS: Cardiovascular: There are no filling defects within the pulmonary arteries to suggest pulmonary embolus. Aortic atherosclerosis. Cannot assess for dissection given phase of contrast tailored to pulmonary artery evaluation. Multi chamber cardiomegaly. Calcification of native coronary arteries, post CABG. Contrast refluxes minimally into the IVC. No pericardial effusion. Left-sided pacemaker in place with the leads in the right atrium and ventricle. Mediastinum/Nodes: Right hilar adenopathy with nodes measuring up to 18 mm short axis. Right subcarinal/infrahilar node measures 10 mm short  axis. Multiple prominent right paratracheal nodes, largest 12 mm short axis. There scattered highest mediastinal nodes. Small prevascular nodes. Calcified left hilar nodes consistent with prior granulomatous disease. Adjacent to the calcified left hilar node is a prominent 9 mm hilar node. Esophagus is patulous. No esophageal wall thickening. No thyroid nodule. Lungs/Pleura: Bilateral pleural nodularity and pleural plaques which have calcified since 2016. No frank pleural  effusion. Patchy ground-glass opacities throughout the entire right lung and to a lesser extent left upper and lower lobe. Trace fissural thickening of the minor fissure. Trachea and mainstem bronchi are patent. No dominant pulmonary mass. Upper Abdomen: No acute findings. Musculoskeletal: Post median sternotomy. Stable bone island in lower thoracic vertebra. Remote left rib fractures. There are no acute or suspicious osseous abnormalities. Review of the MIP images confirms the above findings. IMPRESSION: 1. No pulmonary embolus. 2. Patchy ground-glass opacities throughout the right greater than left lung. Findings favor infection, including atypical viral organisms ( COVID-19). Other infectious or non infectious etiologies are also considered, distribution is not typical of pulmonary edema. 3. Right hilar and mild mediastinal adenopathy. This is likely reactive, however nonspecific. Consider follow-up CT after resolution of symptoms. 4. Diffuse bilateral pleural nodularity which has progressively calcified since 2016. Findings most consistent with prior asbestos exposure. 5. Cardiomegaly. Trace contrast refluxes into the IVC suggesting elevated right heart pressures. 6.  Aortic Atherosclerosis (ICD10-I70.0). Electronically Signed   By: Keith Rake M.D.   On: 11/12/2019 23:29   CT Abdomen Pelvis W Contrast  Result Date: 11/22/2019 CLINICAL DATA:  Abdominal pain and distension EXAM: CT ABDOMEN AND PELVIS WITH CONTRAST TECHNIQUE:  Multidetector CT imaging of the abdomen and pelvis was performed using the standard protocol following bolus administration of intravenous contrast. CONTRAST:  163mL OMNIPAQUE IOHEXOL 300 MG/ML  SOLN COMPARISON:  May 04, 2007 FINDINGS: Lower chest: There is atelectatic change in the lung bases. There are scattered foci of patchy infiltrate in the lung bases, primarily in the right middle and lower lobe regions which are visualized. Pacemaker lead tips are noted in the right atrium and right ventricle. There are foci of coronary artery calcification. Hepatobiliary: There is hepatic steatosis. No focal liver lesions are evident. The gallbladder wall is not appreciably thickened. There is no biliary duct dilatation. Pancreas: There is soft tissue stranding in the head and uncinate process of the pancreas consistent with a degree of acute pancreatitis in this area. The pancreatic tail and body of the pancreas appear normal. There is no mass or pancreatic duct dilatation. Inflammatory change abuts the mid the distal second portions of the duodenum as well as the proximal third portion of the duodenum. No more distant soft tissue stranding or fluid seen. Spleen: No splenic lesions are evident. Adrenals/Urinary Tract: Adrenals bilaterally appear unremarkable. The left kidney is atrophic with marked cortical thinning. There is compensatory hypertrophy of the right kidney. There is a cyst in the medial mid right kidney measuring 1.3 x 1.3 cm. There is a 5 mm cyst arising from the medial upper pole of the right kidney. There is a 1 x 1 cm solid-appearing mass arising from the upper pole left kidney. There is no evident hydronephrosis on either side. There is no renal or ureteral calculus on either side. Urinary bladder is midline with wall thickness within normal limits. Stomach/Bowel: There are multiple sigmoid and descending colonic diverticula without diverticulitis. There is wall thickening involving much of the second  portion of the duodenum in the proximal third portion of the duodenum due to the adjacent pancreatitis. No other bowel wall thickening noted. Terminal ileum appears normal. No evident bowel obstruction. There is no free air or portal venous air. Vascular/Lymphatic: There is a stent in the aorta and common iliac arteries. There is atherosclerotic plaque in the aorta. The current maximum diameter of the aorta at the mid kidney level measures 3.6 x 3.1 cm. There is atherosclerotic  calcification and plaque in multiple pelvic arterial vessels. No adenopathy is appreciable in the abdomen or pelvis. Reproductive: Prostate and seminal vesicles appear normal in size and contour. No pelvic mass evident. Other: Appendix appears normal. No evident abscess or ascites in the abdomen or pelvis. There is fat in each inguinal ring. There is slight fat in the umbilicus. Musculoskeletal: There is degenerative change in the lumbar spine, most notably at L2-3. There are no blastic or lytic bone lesions. There is mild spinal stenosis at L2-3 due to bony hypertrophy and disc bulging. No intramuscular lesions are evident. IMPRESSION: 1. Evidence of a degree of acute pancreatitis involving the head and uncinate process of the pancreas with localized mesenteric thickening in this region. The body and tail the pancreas appear normal. There is no mass or pseudocyst evident. No pancreatic duct dilatation. No more distant fluid or mesenteric thickening. 2. Wall thickening involving the second and proximal third portions of the duodenum, apparently due to inflammation from nearby pancreatitis. 3. Atrophic left kidney. 1 x 1 cm noncystic appearing mass arises from the upper pole this atrophic left kidney. A small renal cell carcinoma must be of concern. Further evaluation with nonemergent pre and post contrast MRI should be considered. Pre and post contrast CT could alternatively be performed, but would likely be of decreased accuracy given lesion  size. 4.  Apparent basilar pneumonia, primarily on the right. 5. Multiple descending colonic and sigmoid diverticula without diverticulitis. No bowel obstruction. No abscess in the abdomen or pelvis. Appendix appears normal. 6. Aortic and iliac artery stent. Maximum current dilatation of the aorta at the mid kidney level measures 3.6 x 3.1 cm. There is no periaortic fluid. There is extensive multilevel arterial vascular calcification. 7. There is a degree of spinal stenosis at L2-3 due to bony hypertrophy and disc bulging. 8.  Hepatic steatosis. Aortic Atherosclerosis (ICD10-I70.0). Electronically Signed   By: Lowella Grip III M.D.   On: 11/22/2019 17:29   DG Chest Port 1 View  Result Date: 11/12/2019 CLINICAL DATA:  Shortness of breath x7 weeks. EXAM: PORTABLE CHEST 1 VIEW COMPARISON:  December 04, 2018 FINDINGS: There are new multifocal airspace opacities bilaterally, greatest in the right mid lung zone. A multi lead left-sided pacemaker is noted with stable appearance of the leads. The patient is status post prior median sternotomy. The inferior-most sternotomy wire is fractured as is the superior most sternotomy wire. The heart size is significantly enlarged. There are multiple old left-sided rib fractures. There is no pneumothorax. There are probable small bilateral pleural effusions. There is likely an old healed right clavicle fracture. IMPRESSION: 1. New multifocal airspace opacities bilaterally, greatest in the right mid lung zone. Findings are suspicious for multifocal pneumonia or developing pulmonary edema. 2. Probable small bilateral pleural effusions. 3. Stable cardiomegaly. Electronically Signed   By: Constance Holster M.D.   On: 11/12/2019 20:17   ECHOCARDIOGRAM COMPLETE  Result Date: 11/13/2019   ECHOCARDIOGRAM REPORT   Patient Name:   Taylor [name] Date of Exam: 11/13/2019 Medical Rec #:  [id number]    Height:       70.0 in Accession #:    [id number]   Weight:       209.8 lb Date of  Birth:  07-Dec-1940   BSA:          2.13 m Patient Age:    78 years     BP:           116/84 mmHg Patient Gender: M  HR:           59 bpm. Exam Location:  [facility] Procedure: 2D Echo, Cardiac Doppler and Color Doppler Indications:     Elevated Troponin  History:         Patient has prior history of Echocardiogram examinations, most                  recent 05/07/2018. CHF; CAD. Loletha Grayer.  Sonographer:     Alyse Low Roar Referring Phys:  [id number] [place] Diagnosing Phys: Serafina Royals MD  Sonographer Comments: Technically difficult study due to poor echo windows. IMPRESSIONS  1. Left ventricular ejection fraction, by visual estimation, is 20 to 25%. The left ventricle has severely decreased function. There is no left ventricular hypertrophy.  2. Moderately dilated left ventricular internal cavity size.  3. The left ventricle demonstrates global hypokinesis.  4. Global right ventricle has normal systolic function.The right ventricular size is normal. No increase in right ventricular wall thickness.  5. Left atrial size was mildly dilated.  6. Right atrial size was mildly dilated.  7. The mitral valve is normal in structure. Moderate mitral valve regurgitation.  8. The tricuspid valve is normal in structure. Tricuspid valve regurgitation moderate.  9. The aortic valve is normal in structure. Aortic valve regurgitation is trivial. 10. The pulmonic valve was normal in structure. Pulmonic valve regurgitation is not visualized. 11. Mildly elevated pulmonary artery systolic pressure. FINDINGS  Left Ventricle: Left ventricular ejection fraction, by visual estimation, is 20 to 25%. The left ventricle has severely decreased function. The left ventricle demonstrates global hypokinesis. The left ventricular internal cavity size was moderately dilated left ventricle. There is no left ventricular hypertrophy. Right Ventricle: The right ventricular size is normal. No increase in right ventricular wall thickness. Global RV  systolic function is has normal systolic function. The tricuspid regurgitant velocity is 2.24 m/s, and with an assumed right atrial pressure  of 10 mmHg, the estimated right ventricular systolic pressure is mildly elevated at 30.1 mmHg. Left Atrium: Left atrial size was mildly dilated. Right Atrium: Right atrial size was mildly dilated Pericardium: There is no evidence of pericardial effusion. Mitral Valve: The mitral valve is normal in structure. Moderate mitral valve regurgitation. Tricuspid Valve: The tricuspid valve is normal in structure. Tricuspid valve regurgitation moderate. Aortic Valve: The aortic valve is normal in structure. Aortic valve regurgitation is trivial. Aortic valve mean gradient measures 4.0 mmHg. Aortic valve peak gradient measures 7.5 mmHg. Aortic valve area, by VTI measures 2.13 cm. Pulmonic Valve: The pulmonic valve was normal in structure. Pulmonic valve regurgitation is not visualized. Pulmonic regurgitation is not visualized. Aorta: The aortic root and ascending aorta are structurally normal, with no evidence of dilitation. IAS/Shunts: No atrial level shunt detected by color flow Doppler.  LEFT VENTRICLE PLAX 2D LVIDd:         6.42 cm  Diastology LVIDs:         5.59 cm  LV e' lateral:   7.22 cm/s LV PW:         1.35 cm  LV E/e' lateral: 12.2 LV IVS:        1.20 cm  LV e' medial:    4.38 cm/s LVOT diam:     2.10 cm  LV E/e' medial:  20.2 LV SV:         57 ml LV SV Index:   25.99 LVOT Area:     3.46 cm  RIGHT VENTRICLE TAPSE (M-mode): 1.2 cm LEFT ATRIUM  Index       RIGHT ATRIUM           Index LA diam:      5.60 cm 2.63 cm/m  RA Area:     19.80 cm LA Vol (A4C): 74.4 ml 34.93 ml/m RA Volume:   53.90 ml  25.30 ml/m  AORTIC VALVE                   PULMONIC VALVE AV Area (Vmax):    1.93 cm    PV Vmax:        0.75 m/s AV Area (Vmean):   2.09 cm    PV Peak grad:   2.2 mmHg AV Area (VTI):     2.13 cm    RVOT Peak grad: 1 mmHg AV Vmax:           137.33 cm/s AV Vmean:           88.167 cm/s AV VTI:            0.268 m AV Peak Grad:      7.5 mmHg AV Mean Grad:      4.0 mmHg LVOT Vmax:         76.33 cm/s LVOT Vmean:        53.167 cm/s LVOT VTI:          0.164 m LVOT/AV VTI ratio: 0.61  AORTA Ao Root diam: 3.50 cm MITRAL VALVE                        TRICUSPID VALVE MV Area (PHT): 4.15 cm             TR Peak grad:   20.1 mmHg MV PHT:        53.07 msec           TR Vmax:        261.00 cm/s MV Decel Time: 183 msec MV E velocity: 88.30 cm/s 103 cm/s  SHUNTS MV A velocity: 64.50 cm/s 70.3 cm/s Systemic VTI:  0.16 m MV E/A ratio:  1.37       1.5       Systemic Diam: 2.10 cm  Serafina Royals MD Electronically signed by Serafina Royals MD Signature Date/Time: 11/13/2019/12:48:45 PM    Final     Scheduled Meds: . allopurinol  300 mg Oral Daily  . atorvastatin  40 mg Oral Daily  . bisacodyl  10 mg Rectal Daily  . carvedilol  12.5 mg Oral Daily  . enoxaparin (LOVENOX) injection  40 mg Subcutaneous Q24H  . losartan  100 mg Oral Daily  . pantoprazole  40 mg Oral Daily  . polyethylene glycol  17 g Oral Daily   Continuous Infusions: PRN Meds:acetaminophen **OR** acetaminophen, fentaNYL (SUBLIMAZE) injection, HYDROcodone-acetaminophen, HYDROmorphone (DILAUDID) injection, labetalol, ondansetron **OR** ondansetron (ZOFRAN) IV  Assessment/Plan: 78 y.o. male with medical history significant of likely recent COVID infection AAA, AICD in place CAD CHF GERD hypertension, coronavirus IgG assay positive Bladder cancer, prediabetes sleep apnea on CPAP history of stroke  Admitted for pancreatitis  Present on Admission: . Pancreatitis -lipase resolved but is still having abdominal pain; not sure if it is due to pancreatitis -Average still has some abdominal pain especially in the light upper and epigastric region -His CT of the abdomen shows just pancreatitis -Lipase in 30s now from 86 on admission - most likely cause being   medication induced or idiopathic  CT of abdomen showing acute  pancreatitis  no  evidence of pseudocyst Status post IV hydration closely due to his severe systolic CHF with a EF of 20 to 25%. Has an ICD. -Currently on low-fat diet pain is not related to food intake -Check lipid panel Triglycerides 182 -Discontinued potential offending agent  Such as bactrim ( pt finished course) Control pain with IV pain medications -Consulted GI on 11/24/2019. Does not think his pain is solely due to pancreatitis. Wanted to see further liver function test and decides on further imaging if necessary. Will follow further recommendation from GI. Appreciate their recommendation - Abdominal x-ray from 11/24/2019 shows no ileus or obstruction but large amount of stool -Patient is on a scheduled bowel regimen and rectal suppository as needed   . Systolic CHF: Currently stable -  with reduced left ventricular function, NYHA class 3 (HCC) sp - Cardiac defibrillator in place - Avoid over aggressive fluid resuscitation will monitor fluid status carefully  .  AKI on chronic kidney disease (CKD), stage III (moderate) -improved to baseline - avoid nephrotoxic medications -Follow-up labs  . Hypercholesteremia - stable continue home meds if able to tolerate  . Obstructive sleep apnea - no CPAP while had recent COVID infection -Current Covid test test negative on this admission however he was positive about a week ago -May consider CPAP use  . Suspected COVID-19 virus infection - unclear if fully asymptomatic Given recent admission Continue airborne precautions for now and reassess may need ID consult to determine when safe to dc precautions CXR - showing persistent bilateral infiltrates  Unclear if lagging radiological findings vs still active COVID -Current Covid test test negative on this admission however he was positive about a week ago If becomes respiratory symptomatic may consider remdesivir trial and steroid  . Leukocytosis -much improving daily  - in the setting  of recent steroids -We will monitor -No fevers or dysuria  Renal  mass - incidental finding but will need to be further worked up on [facility] patient basis once pt is stable  Other plan as per orders.  DVT prophylaxis:   Lovenox  Code Status:  FULL CODE   as per patient    Family Communication:   Updated wife today.  Answered all questions.  Wife wanted to know since she is now Covid positive can the patient be discharged to home when patient is ready.   Disposition Plan:    To home once workup is complete and patient is stable    LOS: 3 days   Ashley Bultema Izetta Dakin

## 2019-11-24 NOTE — Consult Note (Signed)
Taylor Antigua, MD [address], [place], [place], [place], [address] [address], [place], [place], [place], [address] Phone: [phone]  Fax: [phone]  Consultation  Referring Provider:    Dr. Izetta Dakin Primary Care Physician:  Birdie Sons, MD Reason for Consultation:     Pancreatitis  Date of Admission:  11/22/2019 Date of Consultation:  11/24/2019         HPI:   Taylor [name] is a 78 y.o. male with positive IgG for Covid admitted with abdominal pain and diagnosed with pancreatitis based on imaging.  Patient recently was treated for pneumonia with a course of prednisone and Bactrim.  He describes the pain as bilateral lower quadrant, dull, 5/10.  Never had pain in his upper abdomen.  No nausea or vomiting.  Does not have any pain at the time of my evaluation but did have some yesterday.  No nausea or vomiting.  No smoking or alcohol use or previous episodes of pancreatitis  As per H&P patient had sore throat and cough in November and was negative for Covid and was treated with doxycycline at that time.  Continues to have progressive shortness of breath with hypoxia and admitted and was still Covid negative.  However, CT scan showed patchy opacities in differential diagnosis of atypical pneumonia versus suspected Covid infection and patient was given prednisone and Bactrim at hospital discharge.  Although his antigen Covid testing has been negative, his IgG became positive.  Past Medical History:  Diagnosis Date  . AAA (abdominal aortic aneurysm) [facility]) 06/03/2007   [facility]; Dr. Kellie Simmering  . AICD (automatic cardioverter/defibrillator) present   . Barrett's esophagus   . Bladder cancer ([place])   . Bradycardia   . CAD (coronary artery disease)   . CHF (congestive heart failure) ([place])   . Cluster headache   . DDD (degenerative disc disease), lumbar   . Dyspnea    WITH EXERTION  . Edema    LEFT ANKLE  . Fracture of skull base ([place]) 1997   due to  fall  . GERD (gastroesophageal reflux disease)   . Gout   . History of bladder cancer 12/1995  . Hyperlipidemia   . Hypertension   . Malignant melanoma ([place]) 12/2012   right dorsal forearm excised  . Myocardial infarction ([place])    LAST 2014  . Osteoarthritis of knee   . Pacemaker 10/10/2006  . Pneumonia    2016  . Pre-diabetes   . Psoriasis   . Rib fracture 1997   due to fall  . Sleep apnea    CPAP  . Stroke ([place])   . Venous incompetence     Past Surgical History:  Procedure Laterality Date  . ABDOMINAL AORTIC ANEURYSM REPAIR  06/03/2007   [facility]; Dr. Kellie Simmering  . ANGIOPLASTY  1994   MI  . BLADDER TUMOR EXCISION  12/1995  . CATARACT EXTRACTION W/PHACO Left 10/22/2016   Procedure: CATARACT EXTRACTION PHACO AND INTRAOCULAR LENS PLACEMENT (IOC);  Surgeon: Birder Robson, MD;  Location: [facility] ORS;  Service: Ophthalmology;  Laterality: Left;  [place] 47.7 AP% 18.4 CDE 8.78 Fluid pack lot # [id number]  . CATARACT EXTRACTION W/PHACO Right 12/10/2016   Procedure: CATARACT EXTRACTION PHACO AND INTRAOCULAR LENS PLACEMENT (IOC);  Surgeon: Birder Robson, MD;  Location: [facility] ORS;  Service: Ophthalmology;  Laterality: Right;  [place] 00:39 AP% 23.3 CDE 9.13 Fluid pack lot # [id number]  . CORONARY ANGIOPLASTY     STENTS X 5  .  CORONARY ARTERY BYPASS GRAFT  09/22/2006   four  . ELBOW BURSA SURGERY     DUE TO GOUT  . INSERT / REPLACE / REMOVE PACEMAKER    . MELANOMA EXCISION  12/2012   Right forearm    Prior to Admission medications   Medication Sig Start Date End Date Taking? Authorizing Provider  allopurinol (ZYLOPRIM) 300 MG tablet Take 2 tablets (600 mg total) by mouth daily. 01/27/19  Yes Birdie Sons, MD  aspirin 81 MG EC tablet Take 81 mg by mouth daily.    Yes [provider]  atorvastatin (LIPITOR) 40 MG tablet TAKE 1 TABLET BY MOUTH EVERY DAY 12/22/18  Yes Birdie Sons, MD  carvedilol (COREG) 12.5 MG tablet TAKE 1 TABLET BY MOUTH DAILY 08/31/19   Yes Birdie Sons, MD  Cyanocobalamin (VITAMIN B-12 CR) 1000 MCG TBCR Take 1,000 mcg by mouth daily.    Yes [provider]  fluticasone (FLONASE) 50 MCG/ACT nasal spray Place 2 sprays into both nostrils daily. 11/08/19 12/08/19 Yes Birdie Sons, MD  losartan (COZAAR) 50 MG tablet TAKE 2 TABLETS(100 MG) BY MOUTH DAILY Patient taking differently: Take 50 mg by mouth daily.  07/08/19  Yes Birdie Sons, MD  pantoprazole (PROTONIX) 40 MG tablet TAKE 1 TABLET BY MOUTH EVERY DAY 04/09/19  Yes Birdie Sons, MD  predniSONE (DELTASONE) 20 MG tablet Take 2 tablets (40 mg total) by mouth daily with breakfast. 11/14/19  Yes Danford, Suann Larry, MD  sulfamethoxazole-trimethoprim (BACTRIM) 400-80 MG tablet Take 1 tablet by mouth every 12 (twelve) hours. 11/14/19  Yes Danford, Suann Larry, MD  torsemide (DEMADEX) 20 MG tablet TAKE 2 TABLETS(40 MG) BY MOUTH DAILY Patient taking differently: Take 20 mg by mouth 2 (two) times daily.  05/25/19  Yes Birdie Sons, MD  indomethacin (INDOCIN) 50 MG capsule Take 1 capsule (50 mg total) by mouth 3 (three) times daily as needed (for gout flares). 01/22/19   Birdie Sons, MD    Family History  Problem Relation Age of Onset  . Cancer Mother        Melanoma skin cancer  . Heart attack Father 25  . Cancer Father        throat cancer  . Arthritis Brother      Social History   Tobacco Use  . Smoking status: Former Smoker    Packs/day: 1.00    Types: Cigarettes    Quit date: 11/26/1999    Years since quitting: 20.0  . Smokeless tobacco: Never Used  Substance Use Topics  . Alcohol use: No  . Drug use: No    Allergies as of 11/22/2019 - Review Complete 11/22/2019  Allergen Reaction Noted  . Amlodipine besylate Swelling 03/30/2015  . Crestor [rosuvastatin]  03/30/2015  . Rocephin [ceftriaxone]  03/30/2015    Review of Systems:    All systems reviewed and negative except where noted in HPI.   Physical Exam:  Vital signs in  last 24 hours: Vitals:   11/23/19 1617 11/23/19 2134 11/24/19 0400 11/24/19 0755  BP: (!) 164/94 (!) 158/97 (!) 145/91 (!) 176/98  Pulse: 63 62 72 61  Resp: _0 Temp: 97.9 F (36.6 C) (!) 97.5 F (36.4 C) 98 F (36.7 C) 97.6 F (36.4 C)  TempSrc:  Oral Oral Oral  SpO2: 96% 97% 91% 93%  Weight:   91.8 kg   Height:       Last BM Date: 11/21/19 General:   Pleasant,  cooperative in NAD Head:  Normocephalic and atraumatic. Eyes:   No icterus.   Conjunctiva pink. PERRLA. Ears:  Normal auditory acuity. Neck:  Supple; no masses or thyroidomegaly Lungs: Respirations even and unlabored. Lungs clear to auscultation bilaterally.   No wheezes, crackles, or rhonchi.  Abdomen:  Soft, nondistended, nontender. Normal bowel sounds. No appreciable masses or hepatomegaly.  No rebound or guarding.  Neurologic:  Alert and oriented x3;  grossly normal neurologically. Skin:  Intact without significant lesions or rashes. Cervical Nodes:  No significant cervical adenopathy. Psych:  Alert and cooperative. Normal affect.  LAB RESULTS: Recent Labs    11/22/19 1623 11/23/19 0458 11/24/19 0808  WBC 22.9* 23.9* 18.6*  HGB 16.9 16.2 15.7  HCT 50.2 47.0 46.8  PLT 203 164 158   BMET Recent Labs    11/22/19 1623 11/23/19 0458 11/24/19 0808  NA 137 140 136  K 4.0 4.2 4.3  CL 93* 101 100  CO2 _0 GLUCOSE 214* 150* 126*  BUN 31* 23 19  CREATININE 1.40* 1.10 1.14  CALCIUM 9.0 8.8* 8.8*   LFT Recent Labs    11/24/19 0808  PROT 7.0  ALBUMIN 2.7*  AST 24  ALT 19  ALKPHOS 80  BILITOT 1.9*   PT/INR No results for input(s): LABPROT, INR in the last 72 hours.  STUDIES: DG Chest 2 View  Result Date: 11/22/2019 CLINICAL DATA:  Congestion EXAM: CHEST - 2 VIEW COMPARISON:  November 12, 2019 FINDINGS: Again identified are multifocal airspace opacities bilaterally with some progression since the prior study. The heart size remains enlarged. A multi lead left-sided pacemaker is  noted. The lungs are hyperexpanded. There is no pneumothorax. No significant pleural effusion. IMPRESSION: Persistent bilateral multifocal airspace opacities with slight progression since the prior studies consistent with an atypical infectious process such as viral pneumonia. Electronically Signed   By: Constance Holster M.D.   On: 11/22/2019 22:51   DG Abd 1 View  Result Date: 11/24/2019 CLINICAL DATA:  Abdominal pain for 3 days. EXAM: ABDOMEN - 1 VIEW COMPARISON:  Plain films of the abdomen 10/01/2005. FINDINGS: The bowel gas pattern is nonobstructive. There is mild gaseous distention of the transverse colon and a moderately large volume of stool in the ascending colon. No abnormal abdominal calcification or acute bony abnormality is seen. Aortic stent graft is noted. IMPRESSION: No acute abnormality. Moderately large volume of stool in the ascending colon. Electronically Signed   By: Inge Rise M.D.   On: 11/24/2019 10:16   CT Abdomen Pelvis W Contrast  Result Date: 11/22/2019 CLINICAL DATA:  Abdominal pain and distension EXAM: CT ABDOMEN AND PELVIS WITH CONTRAST TECHNIQUE: Multidetector CT imaging of the abdomen and pelvis was performed using the standard protocol following bolus administration of intravenous contrast. CONTRAST:  177m OMNIPAQUE IOHEXOL 300 MG/ML  SOLN COMPARISON:  May 04, 2007 FINDINGS: Lower chest: There is atelectatic change in the lung bases. There are scattered foci of patchy infiltrate in the lung bases, primarily in the right middle and lower lobe regions which are visualized. Pacemaker lead tips are noted in the right atrium and right ventricle. There are foci of coronary artery calcification. Hepatobiliary: There is hepatic steatosis. No focal liver lesions are evident. The gallbladder wall is not appreciably thickened. There is no biliary duct dilatation. Pancreas: There is soft tissue stranding in the head and uncinate process of the pancreas consistent with a degree  of acute pancreatitis in this area. The pancreatic tail and body of the  pancreas appear normal. There is no mass or pancreatic duct dilatation. Inflammatory change abuts the mid the distal second portions of the duodenum as well as the proximal third portion of the duodenum. No more distant soft tissue stranding or fluid seen. Spleen: No splenic lesions are evident. Adrenals/Urinary Tract: Adrenals bilaterally appear unremarkable. The left kidney is atrophic with marked cortical thinning. There is compensatory hypertrophy of the right kidney. There is a cyst in the medial mid right kidney measuring 1.3 x 1.3 cm. There is a 5 mm cyst arising from the medial upper pole of the right kidney. There is a 1 x 1 cm solid-appearing mass arising from the upper pole left kidney. There is no evident hydronephrosis on either side. There is no renal or ureteral calculus on either side. Urinary bladder is midline with wall thickness within normal limits. Stomach/Bowel: There are multiple sigmoid and descending colonic diverticula without diverticulitis. There is wall thickening involving much of the second portion of the duodenum in the proximal third portion of the duodenum due to the adjacent pancreatitis. No other bowel wall thickening noted. Terminal ileum appears normal. No evident bowel obstruction. There is no free air or portal venous air. Vascular/Lymphatic: There is a stent in the aorta and common iliac arteries. There is atherosclerotic plaque in the aorta. The current maximum diameter of the aorta at the mid kidney level measures 3.6 x 3.1 cm. There is atherosclerotic calcification and plaque in multiple pelvic arterial vessels. No adenopathy is appreciable in the abdomen or pelvis. Reproductive: Prostate and seminal vesicles appear normal in size and contour. No pelvic mass evident. Other: Appendix appears normal. No evident abscess or ascites in the abdomen or pelvis. There is fat in each inguinal ring. There is  slight fat in the umbilicus. Musculoskeletal: There is degenerative change in the lumbar spine, most notably at L2-3. There are no blastic or lytic bone lesions. There is mild spinal stenosis at L2-3 due to bony hypertrophy and disc bulging. No intramuscular lesions are evident. IMPRESSION: 1. Evidence of a degree of acute pancreatitis involving the head and uncinate process of the pancreas with localized mesenteric thickening in this region. The body and tail the pancreas appear normal. There is no mass or pseudocyst evident. No pancreatic duct dilatation. No more distant fluid or mesenteric thickening. 2. Wall thickening involving the second and proximal third portions of the duodenum, apparently due to inflammation from nearby pancreatitis. 3. Atrophic left kidney. 1 x 1 cm noncystic appearing mass arises from the upper pole this atrophic left kidney. A small renal cell carcinoma must be of concern. Further evaluation with nonemergent pre and post contrast MRI should be considered. Pre and post contrast CT could alternatively be performed, but would likely be of decreased accuracy given lesion size. 4.  Apparent basilar pneumonia, primarily on the right. 5. Multiple descending colonic and sigmoid diverticula without diverticulitis. No bowel obstruction. No abscess in the abdomen or pelvis. Appendix appears normal. 6. Aortic and iliac artery stent. Maximum current dilatation of the aorta at the mid kidney level measures 3.6 x 3.1 cm. There is no periaortic fluid. There is extensive multilevel arterial vascular calcification. 7. There is a degree of spinal stenosis at L2-3 due to bony hypertrophy and disc bulging. 8.  Hepatic steatosis. Aortic Atherosclerosis (ICD10-I70.0). Electronically Signed   By: Lowella Grip III M.D.   On: 11/22/2019 17:29      Impression / Plan:   Taylor [name] is a 78 y.o. y/o male  with with no previous episodes of pancreatitis, no smoking or alcohol use, normal triglycerides on  admission with recent Bactrim use and now admitted with pancreatitis  CT findings consistent with acute pancreatitis  However, patient's location of the pain is atypical for pancreatitis  Obtain direct bilirubin to fractionate elevated total bilirubin.  Normal alk phos is reassuring that elevated bilirubin is not from biliary obstruction.  If direct bilirubin is elevated, would recommend MRCP  He has not had a bowel movement in 3 days and has not been having flatus.  Would recommend starting him on a bowel regimen with Dulcolax 10 mg p.o. x1 today and then MiraLAX 17 g daily.  Hold for loose stools.  This may help with his abdominal pain given that he has been constipated for the last 3 days  Lipase is not a good marker for following pancreatitis and is followed clinically  He does not have any upper abdominal pain which is reassuring  Etiology of pancreatitis was likely his Bactrim as this is listed as a class I a drugs and he does not have any other risk factors for pancreatitis  Minimize narcotics to prevent worsening constipation  IV fluids as needed for pancreatitis management.  Low-fat diet as patient is hungry and would like to eat  No pancreatic necrosis present  Thank you for involving me in the care of this patient.      LOS: 2 days   Virgel Manifold, MD  11/24/2019, 11:17 AM

## 2019-11-25 DIAGNOSIS — R1030 Lower abdominal pain, unspecified: Secondary | ICD-10-CM

## 2019-11-25 DIAGNOSIS — R109 Unspecified abdominal pain: Secondary | ICD-10-CM

## 2019-11-25 LAB — CBC WITH DIFFERENTIAL/PLATELET
Abs Immature Granulocytes: 0.12 10*3/uL — ABNORMAL HIGH (ref 0.00–0.07)
Basophils Absolute: 0 10*3/uL (ref 0.0–0.1)
Basophils Relative: 0 %
Eosinophils Absolute: 0.1 10*3/uL (ref 0.0–0.5)
Eosinophils Relative: 1 %
HCT: 46.2 % (ref 39.0–52.0)
Hemoglobin: 15 g/dL (ref 13.0–17.0)
Immature Granulocytes: 1 %
Lymphocytes Relative: 11 %
Lymphs Abs: 1.3 10*3/uL (ref 0.7–4.0)
MCH: 31.6 pg (ref 26.0–34.0)
MCHC: 32.5 g/dL (ref 30.0–36.0)
MCV: 97.5 fL (ref 80.0–100.0)
Monocytes Absolute: 0.8 10*3/uL (ref 0.1–1.0)
Monocytes Relative: 6 %
Neutro Abs: 10 10*3/uL — ABNORMAL HIGH (ref 1.7–7.7)
Neutrophils Relative %: 81 %
Platelets: 155 10*3/uL (ref 150–400)
RBC: 4.74 MIL/uL (ref 4.22–5.81)
RDW: 15.1 % (ref 11.5–15.5)
WBC: 12.4 10*3/uL — ABNORMAL HIGH (ref 4.0–10.5)
nRBC: 0 % (ref 0.0–0.2)

## 2019-11-25 LAB — BASIC METABOLIC PANEL
Anion gap: 7 (ref 5–15)
BUN: 20 mg/dL (ref 8–23)
CO2: 29 mmol/L (ref 22–32)
Calcium: 8.8 mg/dL — ABNORMAL LOW (ref 8.9–10.3)
Chloride: 101 mmol/L (ref 98–111)
Creatinine, Ser: 1.06 mg/dL (ref 0.61–1.24)
GFR calc Af Amer: >60 mL/min (ref >60)
GFR calc non Af Amer: >60 mL/min (ref >60)
Glucose, Bld: 112 mg/dL — ABNORMAL HIGH (ref 70–99)
Potassium: 4 mmol/L (ref 3.5–5.1)
Sodium: 137 mmol/L (ref 135–145)

## 2019-11-25 LAB — HEPATIC FUNCTION PANEL
ALT: 21 U/L (ref 0–44)
AST: 28 U/L (ref 15–41)
Albumin: 2.4 g/dL — ABNORMAL LOW (ref 3.5–5.0)
Alkaline Phosphatase: 87 U/L (ref 38–126)
Bilirubin, Direct: 0.5 mg/dL — ABNORMAL HIGH (ref 0.0–0.2)
Indirect Bilirubin: 1.1 mg/dL — ABNORMAL HIGH (ref 0.3–0.9)
Total Bilirubin: 1.6 mg/dL — ABNORMAL HIGH (ref 0.3–1.2)
Total Protein: 6.7 g/dL (ref 6.5–8.1)

## 2019-11-25 LAB — LIPASE, BLOOD: Lipase: 38 U/L (ref 11–51)

## 2019-11-25 LAB — MAGNESIUM: Magnesium: 1.9 mg/dL (ref 1.7–2.4)

## 2019-11-25 MED ORDER — BISACODYL 10 MG RE SUPP: 10.0000 mg | Freq: Every day | RECTAL | Status: DC | PRN

## 2019-11-25 MED ORDER — BISACODYL 10 MG RE SUPP
10.0000 mg | Freq: Every day | RECTAL | Status: DC
  Administered 2019-11-25 – 2019-11-28 (×4): 10 mg via RECTAL
  Filled 2019-11-25 (×5): qty 1

## 2019-11-25 NOTE — Progress Notes (Signed)
   This RN returned phone call to patient's wife, Ji Feldner ([phone]) who had yet to receive an update about her husband's status. Informed patient's spouse about his current condition and plan of care, including goal of MRCP planned for 12/31. Wife requests she is updated after procedure.

## 2019-11-25 NOTE — Progress Notes (Signed)
Vonda Antigua, MD [address], [place], [place], [place], [address] [address], [place], [place], [place], [address] Phone: [phone]  Fax: [phone]   Subjective: No BMs in 3 days. Passing gas. No N/V   Objective: Exam: Vital signs in last 24 hours: Vitals:   11/24/19 2049 11/25/19 0524 11/25/19 0735 11/25/19 0824  BP: (!) 159/93 (!) 160/96 (!) 167/77 (!) 162/94  Pulse: (!) 59 61 (!) 59 61  Resp: _0 Temp: 97.7 F (36.5 C) (!) 97.4 F (36.3 C) 98.8 F (37.1 C) 97.8 F (36.6 C)  TempSrc: Oral Oral  Oral  SpO2: 94% 96% 94% 90%  Weight:      Height:       Weight change:   Intake/Output Summary (Last 24 hours) at 11/25/2019 1526 Last data filed at 11/25/2019 0127 Gross per 24 hour  Intake 150 ml  Output 50 ml  Net 100 ml    General: No acute distress, AAO x3 Abd: Soft, NT/ND, No HSM Skin: Warm, no rashes Neck: Supple, Trachea midline   Lab Results: Lab Results  Component Value Date   WBC 12.4 (H) 11/25/2019   HGB 15.0 11/25/2019   HCT 46.2 11/25/2019   MCV 97.5 11/25/2019   PLT 155 11/25/2019   Micro Results: Recent Results (from the past 240 hour(s))  SARS CORONAVIRUS 2 (TAT 6-24 HRS) Nasopharyngeal Nasopharyngeal Swab     Status: None   Collection Time: 11/23/19  1:49 AM   Specimen: Nasopharyngeal Swab  Result Value Ref Range Status   SARS Coronavirus 2 NEGATIVE NEGATIVE Final    Comment: (NOTE) SARS-CoV-2 target nucleic acids are NOT DETECTED. The SARS-CoV-2 RNA is generally detectable in upper and lower respiratory specimens during the acute phase of infection. Negative results do not preclude SARS-CoV-2 infection, do not rule out co-infections with other pathogens, and should not be used as the sole basis for treatment or other patient management decisions. Negative results must be combined with clinical observations, patient history, and epidemiological information. The expected result is Negative. Fact Sheet for  Patients: SugarRoll.be Fact Sheet for Healthcare Providers: https://www.woods-mathews.com/ This test is not yet approved or cleared by the [place] FDA and  has been authorized for detection and/or diagnosis of SARS-CoV-2 by FDA under an Emergency Use Authorization (EUA). This EUA will remain  in effect (meaning this test can be used) for the duration of the COVID-19 declaration under Section 56 4(b)(1) of the Act, 21 U.S.C. section 360bbb-3(b)(1), unless the authorization is terminated or revoked sooner. Performed at [facility], [place] [address] [place], [place] [address]    Studies/Results: DG Abd 1 View  Result Date: 11/24/2019 CLINICAL DATA:  Abdominal pain for 3 days. EXAM: ABDOMEN - 1 VIEW COMPARISON:  Plain films of the abdomen 10/01/2005. FINDINGS: The bowel gas pattern is nonobstructive. There is mild gaseous distention of the transverse colon and a moderately large volume of stool in the ascending colon. No abnormal abdominal calcification or acute bony abnormality is seen. Aortic stent graft is noted. IMPRESSION: No acute abnormality. Moderately large volume of stool in the ascending colon. Electronically Signed   By: Inge Rise M.D.   On: 11/24/2019 10:16   Medications:  Scheduled Meds: . allopurinol  300 mg Oral Daily  . atorvastatin  40 mg Oral Daily  . bisacodyl  10 mg Rectal Daily  . carvedilol  12.5 mg Oral Daily  . enoxaparin (LOVENOX) injection  40 mg Subcutaneous Q24H  .  losartan  100 mg Oral Daily  . pantoprazole  40 mg Oral Daily  . polyethylene glycol  17 g Oral Daily   Continuous Infusions: PRN Meds:.acetaminophen **OR** acetaminophen, fentaNYL (SUBLIMAZE) injection, HYDROcodone-acetaminophen, HYDROmorphone (DILAUDID) injection, labetalol, ondansetron **OR** ondansetron (ZOFRAN) IV   Assessment: Active Problems:   Cardiac defibrillator in place   Systolic CHF with reduced left ventricular  function, NYHA class 3 (HCC)   Chronic kidney disease (CKD), stage III (moderate)   Hypercholesteremia   Obstructive sleep apnea   History of CVA (cerebrovascular accident)   History of AAA (abdominal aortic aneurysm) repair   Suspected COVID-19 virus infection   Pancreatitis   Leukocytosis   Abdominal pain    Plan: Liver enzymes not consistent with biliary obstruction Indirect bilirubin higher than direct bilirubin.  Alk phos normal.  Bowel regimen started by primary team  Continue to monitor for bowel movements.  If no bowel movement in another 1 to 2 days, add other laxatives such as senna.  Patient's pain is predominantly lower and since he has not had a bowel movement, it may be related to constipation  X-ray yesterday showing large volume of stool in the ascending colon is also consistent with the same  Pancreatitis improving and patient does not have any upper abdominal pain  GI service will sign off at this time, please page with any questions or concerns    LOS: 3 days   Varnita Tahiliani, MD 11/25/2019, 3:26 PM  

## 2019-11-25 NOTE — Care Management Important Message (Signed)
Important Message  Patient Details  Name: HAMP MORELAND MRN: [id number] Date of Birth: 09-Jan-1941   Medicare Important Message Given:  Yes  Verbally reviewed with patient via room phone.  Aware of right.  Requested copy of Medicare IM be brought to his room.  Copy left with unit secretary to bring into room next time someone goes in.     Dannette Barbara 11/25/2019, 12:29 PM

## 2019-11-25 NOTE — Plan of Care (Signed)
  Problem: Education: Goal: Knowledge of Pancreatitis treatment and prevention will improve Outcome: Progressing   Problem: Education: Goal: Knowledge of risk factors and measures for prevention of condition will improve Outcome: Progressing   Problem: Elimination: Goal: Will not experience complications related to bowel motility Outcome: Not Progressing

## 2019-11-25 NOTE — Plan of Care (Signed)
  Problem: Education: Goal: Knowledge of General Education information will improve Description: Including pain rating scale, medication(s)/side effects and non-pharmacologic comfort measures Outcome: Progressing   Problem: Health Behavior/Discharge Planning: Goal: Ability to manage health-related needs will improve Outcome: Not Progressing Note: Patient still complaining of 8/10 abdominal pain this AM. Medicated with IV Dilaudid. Has not called for more thus far. Trying to avoid narcotics r/t c/o constipation. Also medicated with scheduled Miralax earlier. Will continue to monitor GI status for the remainder of the shift. Wenda Low [facility]

## 2019-11-26 DIAGNOSIS — I5020 Unspecified systolic (congestive) heart failure: Secondary | ICD-10-CM

## 2019-11-26 LAB — MAGNESIUM: Magnesium: 2.1 mg/dL (ref 1.7–2.4)

## 2019-11-26 LAB — BASIC METABOLIC PANEL
Anion gap: 10 (ref 5–15)
BUN: 20 mg/dL (ref 8–23)
CO2: 24 mmol/L (ref 22–32)
Calcium: 9 mg/dL (ref 8.9–10.3)
Chloride: 104 mmol/L (ref 98–111)
Creatinine, Ser: 1.21 mg/dL (ref 0.61–1.24)
GFR calc Af Amer: >60 mL/min (ref >60)
GFR calc non Af Amer: 57 mL/min — ABNORMAL LOW (ref >60)
Glucose, Bld: 159 mg/dL — ABNORMAL HIGH (ref 70–99)
Potassium: 4.2 mmol/L (ref 3.5–5.1)
Sodium: 138 mmol/L (ref 135–145)

## 2019-11-26 NOTE — Progress Notes (Signed)
[facility] PROGRESS NOTE  Taylor [name] CHY:[id number] DOB: 01/30/1941 DOA: 11/22/2019 PCP: Birdie Sons, MD  Assessment/Plan: Present on Admission: .Pancreatitis -lipase improved to normal -But still has some abdominal pain especially in the light upper and lower quadrants-His CT of the abdomen shows just pancreatitis - most likely cause of pancreatitis being medication induced or idiopathic CT of abdomen showing acute pancreatitis no evidence of pseudocyst Status postIV hydration closely due to his severe systolic CHF with a EF of 20 to 25%.  Patient also has implanted -Consulted GI service due to patient's still experiencing disproportional abdominal pain -We will check direct bilirubin bilirubin if it is elevated then consider further work-up.  However patient has a defibrillator implanted -Abdominal x-ray this morning shows no ileus or obstruction but large amount of stool -We will start bowel regimen as per GI recommendation -Also resume diet with full liquid diet and will advance to low and low-fat diet if patient tolerates -Check lipid panelTriglycerides 182 -Discontinued potential offending agentSuch as bactrim ( pt finished course) Control pain with IV pain medications -Appreciate GI recommendation  .Systolic CHF: Currently stable - with reduced left ventricular function, NYHA class 3 (HCC) sp -Cardiac defibrillator in place - Avoid over aggressive fluid resuscitation will monitor fluid status carefully  . AKI on chronic kidney disease(CKD), stage III (moderate) -improved on the day after admission  - avoid nephrotoxic medications -Follow-up labs  .Hypercholesteremia -stable continue home meds if able to tolerate  .Obstructive sleep apnea- no CPAP while had recent COVID infection -Current Covid test  came back negative on 11/23/2019 -If negative may start CPAP at night  .Suspected COVID-19 virus infection-does not have fever or  shortness of breath or any URI symptoms - unclear if fully asymptomatic Given recent admission Continue airborne precautions for now and reassess may need ID consult to determine when safe to dc precautions CXR - showing persistent bilateral infiltrates  Unclear if lagging radiological findings vs still active COVID -COVID-19 test came back negative on 11/23/2019  .Leukocytosis- in the setting of recent steroids -improving -We will monitor -No fevers or dysuria  Renal mass -incidental finding but will need to be further worked up on OUt patient basis once pt is stable Code Status: Full Code Family Communication: none at bedside (indicate person spoken with, relationship, and if by phone, the number) Disposition Plan: D/C home.    Consultants:  GI  Procedures: None  Antibiotics: None  HPI/Subjective: Pt today is alert, awake and denies any complaints. Ros is negative for any chesty pain or sob and any symptoms . GI has been consulted and greatly appreciate Management and care.   Objective: Vitals:   11/26/19 0433 11/26/19 0826  BP: (!) 147/80 (!) 149/106  Pulse: 62 65  Resp: 18 18  Temp: 98 F (36.7 C) (!) 97.5 F (36.4 C)  SpO2: 95% 96%    Intake/Output Summary (Last 24 hours) at 11/26/2019 1545 Last data filed at 11/26/2019 1459 Gross per 24 hour  Intake --  Output 0 ml  Net 0 ml   Filed Weights   11/22/19 1615 11/23/19 0100 11/24/19 0400  Weight: 95 kg 92.1 kg 91.8 kg    Exam:   General:  [place]/AT  Cardiovascular: RRR no murmur  Respiratory: CLATABL.  Abdomen: Soft nt/nd and is feeling better .  Musculoskeletal:WNL    Data Reviewed: Basic Metabolic Panel: Recent Labs  Lab 11/22/19 1623 11/23/19 0458 11/24/19 0808 11/25/19 0516 11/26/19 0359  NA 137 140 136 137 138  K 4.0 4.2 4.3 4.0 4.2  CL 93* 101 100 101 104  CO2 30 27 26 29 24   GLUCOSE 214* 150* 126* 112* 159*  BUN 31* 23 19 20 20   CREATININE 1.40* 1.10 1.14 1.06 1.21   CALCIUM 9.0 8.8* 8.8* 8.8* 9.0  MG  --  2.1  --  1.9 2.1  PHOS  --  3.3  --   --   --    Liver Function Tests: Recent Labs  Lab 11/22/19 1623 11/23/19 0458 11/24/19 0808 11/25/19 0516  AST 26 20 24 28   ALT 28 20 19 21   ALKPHOS 100 97 80 87  BILITOT 2.1* 1.8* 1.9* 1.6*  PROT 7.7 6.8 7.0 6.7  ALBUMIN 3.2* 2.7* 2.7* 2.4*   Recent Labs  Lab 11/22/19 1623 11/23/19 0458 11/24/19 0808 11/25/19 0516  LIPASE 86* 57* 44 38   No results for input(s): AMMONIA in the last 168 hours. CBC: Recent Labs  Lab 11/22/19 1623 11/23/19 0458 11/24/19 0808 11/25/19 0516  WBC 22.9* 23.9* 18.6* 12.4*  NEUTROABS 19.4*  --  15.8* 10.0*  HGB 16.9 16.2 15.7 15.0  HCT 50.2 47.0 46.8 46.2  MCV 95.4 93.8 98.1 97.5  PLT 203 164 158 155   Cardiac Enzymes: No results for input(s): CKTOTAL, CKMB, CKMBINDEX, TROPONINI in the last 168 hours. BNP (last 3 results) Recent Labs    11/12/19 1934  BNP 102.0*    ProBNP (last 3 results) No results for input(s): PROBNP in the last 8760 hours.  CBG: No results for input(s): GLUCAP in the last 168 hours.  Recent Results (from the past 240 hour(s))  SARS CORONAVIRUS 2 (TAT 6-24 HRS) Nasopharyngeal Nasopharyngeal Swab     Status: None   Collection Time: 11/23/19  1:49 AM   Specimen: Nasopharyngeal Swab  Result Value Ref Range Status   SARS Coronavirus 2 NEGATIVE NEGATIVE Final    Comment: (NOTE) SARS-CoV-2 target nucleic acids are NOT DETECTED. The SARS-CoV-2 RNA is generally detectable in upper and lower respiratory specimens during the acute phase of infection. Negative results do not preclude SARS-CoV-2 infection, do not rule out co-infections with other pathogens, and should not be used as the sole basis for treatment or other patient management decisions. Negative results must be combined with clinical observations, patient history, and epidemiological information. The expected result is Negative. Fact Sheet for  Patients: SugarRoll.be Fact Sheet for Healthcare Providers: https://www.woods-mathews.com/ This test is not yet approved or cleared by the [place] FDA and  has been authorized for detection and/or diagnosis of SARS-CoV-2 by FDA under an Emergency Use Authorization (EUA). This EUA will remain  in effect (meaning this test can be used) for the duration of the COVID-19 declaration under Section 56 4(b)(1) of the Act, 21 U.S.C. section 360bbb-3(b)(1), unless the authorization is terminated or revoked sooner. Performed at [facility], [place] [address] [place], [place] [address]      Studies: No results found.  Scheduled Meds: . allopurinol  300 mg Oral Daily  . atorvastatin  40 mg Oral Daily  . bisacodyl  10 mg Rectal Daily  . carvedilol  12.5 mg Oral Daily  . enoxaparin (LOVENOX) injection  40 mg Subcutaneous Q24H  . losartan  100 mg Oral Daily  . pantoprazole  40 mg Oral Daily  . polyethylene glycol  17 g Oral Daily   Time spent: 30 minutes.   [facility]  If 7PM-7AM, please contact night-coverage at [email or web address], password [facility] 11/26/2019,  3:45 PM  LOS: 4 days

## 2019-11-26 NOTE — Plan of Care (Signed)
  Problem: Education: Goal: Knowledge of General Education information will improve Description: Including pain rating scale, medication(s)/side effects and non-pharmacologic comfort measures Outcome: Progressing   Problem: Health Behavior/Discharge Planning: Goal: Ability to manage health-related needs will improve Outcome: Progressing Note: Per report patient had a small BM overnight, but didn't respond well to scheduled Dulcolax suppository and Miralax this AM. Patient's bowel sounds remain hypoactive, with diffuse tenderness throughout. Will continue to monitor GI status for the remainder of the shift. IV PRN Dilaudid also already administered. Wenda Low [facility]

## 2019-11-27 ENCOUNTER — Inpatient Hospital Stay: Payer: Medicare Other

## 2019-11-27 ENCOUNTER — Encounter: Payer: Self-pay | Admitting: Internal Medicine

## 2019-11-27 DIAGNOSIS — N183 Chronic kidney disease, stage 3 unspecified: Secondary | ICD-10-CM

## 2019-11-27 LAB — BASIC METABOLIC PANEL
Anion gap: 11 (ref 5–15)
BUN: 20 mg/dL (ref 8–23)
CO2: 24 mmol/L (ref 22–32)
Calcium: 9 mg/dL (ref 8.9–10.3)
Chloride: 100 mmol/L (ref 98–111)
Creatinine, Ser: 1.32 mg/dL — ABNORMAL HIGH (ref 0.61–1.24)
GFR calc Af Amer: 60 mL/min — ABNORMAL LOW (ref >60)
GFR calc non Af Amer: 52 mL/min — ABNORMAL LOW (ref >60)
Glucose, Bld: 136 mg/dL — ABNORMAL HIGH (ref 70–99)
Potassium: 4.4 mmol/L (ref 3.5–5.1)
Sodium: 135 mmol/L (ref 135–145)

## 2019-11-27 LAB — CBC WITH DIFFERENTIAL/PLATELET
Abs Immature Granulocytes: 0.09 10*3/uL — ABNORMAL HIGH (ref 0.00–0.07)
Basophils Absolute: 0 10*3/uL (ref 0.0–0.1)
Basophils Relative: 0 %
Eosinophils Absolute: 0.1 10*3/uL (ref 0.0–0.5)
Eosinophils Relative: 1 %
HCT: 46.4 % (ref 39.0–52.0)
Hemoglobin: 14.8 g/dL (ref 13.0–17.0)
Immature Granulocytes: 1 %
Lymphocytes Relative: 18 %
Lymphs Abs: 1.6 10*3/uL (ref 0.7–4.0)
MCH: 31.4 pg (ref 26.0–34.0)
MCHC: 31.9 g/dL (ref 30.0–36.0)
MCV: 98.3 fL (ref 80.0–100.0)
Monocytes Absolute: 0.8 10*3/uL (ref 0.1–1.0)
Monocytes Relative: 9 %
Neutro Abs: 6.3 10*3/uL (ref 1.7–7.7)
Neutrophils Relative %: 71 %
Platelets: 153 10*3/uL (ref 150–400)
RBC: 4.72 MIL/uL (ref 4.22–5.81)
RDW: 15.2 % (ref 11.5–15.5)
WBC: 9 10*3/uL (ref 4.0–10.5)
nRBC: 0 % (ref 0.0–0.2)

## 2019-11-27 LAB — MAGNESIUM: Magnesium: 1.8 mg/dL (ref 1.7–2.4)

## 2019-11-27 MED ORDER — HYDRALAZINE HCL 20 MG/ML IJ SOLN: 5.0000 mg | Freq: Four times a day (QID) | INTRAMUSCULAR | Status: DC | PRN

## 2019-11-27 MED ORDER — LACTULOSE 10 GM/15ML PO SOLN
20.0000 g | Freq: Once | ORAL | Status: AC
  Administered 2019-11-27: 18:00:00 20 g via ORAL
  Filled 2019-11-27: qty 30

## 2019-11-27 MED ORDER — DOCUSATE SODIUM 100 MG PO CAPS
100.0000 mg | ORAL_CAPSULE | Freq: Once | ORAL | Status: AC
  Administered 2019-11-27: 11:00:00 100 mg via ORAL
  Filled 2019-11-27: qty 1

## 2019-11-27 MED ORDER — HYDROMORPHONE HCL 1 MG/ML IJ SOLN: 0.5000 mg | INTRAMUSCULAR | Status: DC | PRN

## 2019-11-27 MED ORDER — IOHEXOL 300 MG/ML  SOLN
100.0000 mL | Freq: Once | INTRAMUSCULAR | Status: AC | PRN
  Administered 2019-11-27: 13:00:00 100 mL via INTRAVENOUS

## 2019-11-27 MED ORDER — LACTATED RINGERS IV SOLN: INTRAVENOUS | Status: AC

## 2019-11-27 NOTE — Progress Notes (Signed)
MEDICATION RELATED CONSULT NOTE - FOLLOW UP   Pharmacy Consult for Renal Adjustment of Allopurinol  Allergies  Allergen Reactions  . Amlodipine Besylate Swelling    Had a reaction when taking with colcrys   . Crestor [Rosuvastatin]     Muscle cramps and pain  . Rocephin [Ceftriaxone]     unknown    Patient Measurements: Height: 5\' 10"  (177.8 cm) Weight: 202 lb 6.4 oz (91.8 kg) IBW/kg (Calculated) : 73 Adjusted Body Weight:    Vital Signs: Temp: 98.9 F (37.2 C) (01/02 0938) Temp Source: Oral (01/02 0342) BP: 154/98 (01/02 4431) Pulse Rate: 60 (01/02 0938) Intake/Output from previous day: 01/01 0701 - 01/02 0700 In: -  Out: 800 [Urine:800] Intake/Output from this shift: Total I/O In: -  Out: 900 [Urine:900]  Labs: Recent Labs    11/24/19 1456 11/25/19 0516 11/26/19 0359 11/27/19 0630  WBC  --  12.4*  --  9.0  HGB  --  15.0  --  14.8  HCT  --  46.2  --  46.4  PLT  --  155  --  153  CREATININE  --  1.06 1.21 1.32*  MG  --  1.9 2.1 1.8  ALBUMIN  --  2.4*  --   --   PROT  --  6.7  --   --   AST  --  28  --   --   ALT  --  21  --   --   ALKPHOS  --  87  --   --   BILITOT  --  1.6*  --   --   BILIDIR 0.6* 0.5*  --   --   IBILI  --  1.1*  --   --    Estimated Creatinine Clearance: 53.4 mL/min (A) (by C-G formula based on SCr of 1.32 mg/dL (H)).   Microbiology: Recent Results (from the past 720 hour(s))  Respiratory Panel by RT PCR (Flu A&B, Covid) - Nasopharyngeal Swab     Status: None   Collection Time: 11/12/19  8:35 PM   Specimen: Nasopharyngeal Swab  Result Value Ref Range Status   SARS Coronavirus 2 by RT PCR NEGATIVE NEGATIVE Final    Comment: (NOTE) SARS-CoV-2 target nucleic acids are NOT DETECTED. The SARS-CoV-2 RNA is generally detectable in upper respiratoy specimens during the acute phase of infection. The lowest concentration of SARS-CoV-2 viral copies this assay can detect is 131 copies/mL. A negative result does not preclude  SARS-Cov-2 infection and should not be used as the sole basis for treatment or other patient management decisions. A negative result may occur with  improper specimen collection/handling, submission of specimen other than nasopharyngeal swab, presence of viral mutation(s) within the areas targeted by this assay, and inadequate number of viral copies (<131 copies/mL). A negative result must be combined with clinical observations, patient history, and epidemiological information. The expected result is Negative. Fact Sheet for Patients:  PinkCheek.be Fact Sheet for Healthcare Providers:  GravelBags.it This test is not yet ap proved or cleared by the [place] FDA and  has been authorized for detection and/or diagnosis of SARS-CoV-2 by FDA under an Emergency Use Authorization (EUA). This EUA will remain  in effect (meaning this test can be used) for the duration of the COVID-19 declaration under Section 564(b)(1) of the Act, 21 U.S.C. section 360bbb-3(b)(1), unless the authorization is terminated or revoked sooner.    Influenza A by PCR NEGATIVE NEGATIVE Final   Influenza B by PCR NEGATIVE NEGATIVE  Final    Comment: (NOTE) The Xpert Xpress SARS-CoV-2/FLU/RSV assay is intended as an aid in  the diagnosis of influenza from Nasopharyngeal swab specimens and  should not be used as a sole basis for treatment. Nasal washings and  aspirates are unacceptable for Xpert Xpress SARS-CoV-2/FLU/RSV  testing. Fact Sheet for Patients: PinkCheek.be Fact Sheet for Healthcare Providers: GravelBags.it This test is not yet approved or cleared by the [place] FDA and  has been authorized for detection and/or diagnosis of SARS-CoV-2 by  FDA under an Emergency Use Authorization (EUA). This EUA will remain  in effect (meaning this test can be used) for the duration of the  Covid-19  declaration under Section 564(b)(1) of the Act, 21  U.S.C. section 360bbb-3(b)(1), unless the authorization is  terminated or revoked. Performed at [facility], [address] [place], [place] [address]   Culture, blood (routine x 2)     Status: None   Collection Time: 11/13/19 12:04 AM   Specimen: BLOOD  Result Value Ref Range Status   Specimen Description BLOOD LEFT ANTECUBITAL  Final   Special Requests   Final    BOTTLES DRAWN AEROBIC AND ANAEROBIC Blood Culture results may not be optimal due to an excessive volume of blood received in culture bottles   Culture   Final    NO GROWTH 5 DAYS Performed at [facility], [address] [place], [place] [address]    Report Status 11/18/2019 FINAL  Final  Culture, blood (routine x 2)     Status: None   Collection Time: 11/13/19 12:06 AM   Specimen: BLOOD  Result Value Ref Range Status   Specimen Description BLOOD RIGHT HAND  Final   Special Requests   Final    BOTTLES DRAWN AEROBIC AND ANAEROBIC Blood Culture adequate volume   Culture   Final    NO GROWTH 5 DAYS Performed at [facility], [address] [place], [place] [address]    Report Status 11/18/2019 FINAL  Final  Respiratory Panel by PCR     Status: None   Collection Time: 11/14/19  1:08 PM   Specimen: Nasopharyngeal Swab; Respiratory  Result Value Ref Range Status   Adenovirus NOT DETECTED NOT DETECTED Final   Coronavirus 229E NOT DETECTED NOT DETECTED Final    Comment: (NOTE) The Coronavirus on the Respiratory Panel, DOES NOT test for the novel  Coronavirus (2019 nCoV)    Coronavirus HKU1 NOT DETECTED NOT DETECTED Final   Coronavirus NL63 NOT DETECTED NOT DETECTED Final   Coronavirus OC43 NOT DETECTED NOT DETECTED Final   Metapneumovirus NOT DETECTED NOT DETECTED Final   Rhinovirus / Enterovirus NOT DETECTED NOT DETECTED Final   Influenza A NOT DETECTED NOT DETECTED Final   Influenza B NOT DETECTED NOT DETECTED Final    Parainfluenza Virus 1 NOT DETECTED NOT DETECTED Final   Parainfluenza Virus 2 NOT DETECTED NOT DETECTED Final   Parainfluenza Virus 3 NOT DETECTED NOT DETECTED Final   Parainfluenza Virus 4 NOT DETECTED NOT DETECTED Final   Respiratory Syncytial Virus NOT DETECTED NOT DETECTED Final   Bordetella pertussis NOT DETECTED NOT DETECTED Final   Chlamydophila pneumoniae NOT DETECTED NOT DETECTED Final   Mycoplasma pneumoniae NOT DETECTED NOT DETECTED Final    Comment: Performed at [facility], [place] [address] [place], [place] [address]  SARS CORONAVIRUS 2 (TAT 6-24 HRS) Nasopharyngeal Nasopharyngeal Swab     Status: None   Collection Time: 11/23/19  1:49 AM   Specimen:  Nasopharyngeal Swab  Result Value Ref Range Status   SARS Coronavirus 2 NEGATIVE NEGATIVE Final    Comment: (NOTE) SARS-CoV-2 target nucleic acids are NOT DETECTED. The SARS-CoV-2 RNA is generally detectable in upper and lower respiratory specimens during the acute phase of infection. Negative results do not preclude SARS-CoV-2 infection, do not rule out co-infections with other pathogens, and should not be used as the sole basis for treatment or other patient management decisions. Negative results must be combined with clinical observations, patient history, and epidemiological information. The expected result is Negative. Fact Sheet for Patients: SugarRoll.be Fact Sheet for Healthcare Providers: https://www.woods-mathews.com/ This test is not yet approved or cleared by the [place] FDA and  has been authorized for detection and/or diagnosis of SARS-CoV-2 by FDA under an Emergency Use Authorization (EUA). This EUA will remain  in effect (meaning this test can be used) for the duration of the COVID-19 declaration under Section 56 4(b)(1) of the Act, 21 U.S.C. section 360bbb-3(b)(1), unless the authorization is terminated or revoked sooner. Performed at [place], [place] [address], [place], [place] [address]    Assessment: Patient is a 79yo male admitted with pancreatitis and AKI. Pharmacy consulted to renally adjust Allopurinol.  Plan:  Patient was taking Allopurinol 600mg  daily prior to admission, currently ordered 300mg  daily. No adjustment to dosing is needed at this time, if CrCl declines further to less than 39ml/min then would need to decrease to 200mg  daily. Will continue to follow.  Paulina Fusi, PharmD, BCPS 11/27/2019 2:08 PM

## 2019-11-27 NOTE — Progress Notes (Signed)
[facility] PROGRESS NOTE  Taylor [name] KXF:[id number] DOB: 1941/06/09 DOA: 11/22/2019 PCP: Birdie Sons, MD  Assessment/Plan: Present on Admission: .Abdominal Pain: -Pt had ct of abd on 11/22/19 which showed multiple abnormal findings , results of which were d/w wife ms Taylor [name] today. With plan to reconnect once ct abd /pelvis is resulted.Repeat Ct was done today whic showed no suspicious lesion. Infrarenal graft visualized and persistent edema of pancrease which I suspect is reason for pain.  .Pancreatitis -lipase improved to normal -But still has some abdominal pain especially in the light upper and lower quadrants-His CT of the abdomen shows just pancreatitis - most likely cause of pancreatitis being medication induced or idiopathic CT of abdomen showing acute pancreatitis no evidence of pseudocyst Status postIV hydration closely due to his severe systolic CHF with a EF of 20 to 25%.  Patient also has implanted -Consulted GI service due to patient's still experiencing disproportional abdominal pain -We will check direct bilirubin bilirubin if it is elevated then consider further work-up.  However patient has a defibrillator implanted -Abdominal x-ray this morning shows no ileus or obstruction but large amount of stool -We will start bowel regimen as per GI recommendation -Also resume diet with full liquid diet and will advance to low and low-fat diet if patient tolerates -Check lipid panelTriglycerides 182 -Discontinued potential offending agentSuch as bactrim ( pt finished course) Control pain with IV pain medications -Appreciate GI recommendation  .Systolic CHF: Currently stable - with reduced left ventricular function, NYHA class 3 (HCC) sp -Cardiac defibrillator in place - Avoid over aggressive fluid resuscitation will monitor fluid status carefully  . AKI on chronic kidney disease(CKD), stage III (moderate) -improved on the day after admission  -  avoid nephrotoxic medications - Creatinine 1.6 on admission went down to 1.06 and went up again today to 1.32. -Due to his abd pain I am repeating a ct abd/pelvis with po an d iv contrast and stopping his losartan. -Will consider nephrology consult for intermittent AKI on CKD. -ivf hydration for abnormal creatinine x 24 hour.   Marland KitchenHTN: Losartan d/c due to intermittent AKI, will cont coreg and add prn hydralazine and d/c MD to decide on additional meds for ARB substitution or restarting.   Marland KitchenHypercholesteremia -stable continue home meds if able to tolerate  .Obstructive sleep apnea -Current Covid test  came back negative on 11/23/2019 -If negative may start CPAP at night  .Suspected COVID-19 virus infection-does not have fever or shortness of breath or any URI symptoms - unclear if fully asymptomatic Covid-19 swab is negative.   .Leukocytosis- in the setting of recent steroids -improving -We will monitor -No fevers or dysuria  Renal mass - Repeat imaging pt does not have a renal mass.   Code Status: Full Code Family Communication: none at bedside (indicate person spoken with, relationship, and if by phone, the number) Disposition Plan: D/C home.    Consultants:  GI  Procedures: None  Antibiotics: None  HPI/Subjective: Pt today is alert, awake and denies any complaints. Ros is negative for any chesty pain or sob and any symptoms . GI has been consulted and greatly appreciate Management and care.  11/27/2019 PT seen today for f/u and he is speaking to wife on phone and hangs up states he has not had a bowel movement in four days and his stomach is hurting . D/W pt that I will start him on colace and obtain repeat imaging due to degree of pain on exam .Chart reviewed today and  pt was on covid-19 precaution as PUI but his covid swab is negative and we will d/c his isolation precaution.      Objective: Vitals:   11/27/19 0938 11/27/19 1623  BP: (!) 154/98 (!)  146/88  Pulse: 60 (!) 59  Resp: 19 19  Temp: 98.9 F (37.2 C) (!) 97.5 F (36.4 C)  SpO2: 96% 99%    Intake/Output Summary (Last 24 hours) at 11/27/2019 1718 Last data filed at 11/27/2019 1600 Gross per 24 hour  Intake 206.61 ml  Output 1700 ml  Net -1493.39 ml   Filed Weights   11/22/19 1615 11/23/19 0100 11/24/19 0400  Weight: 95 kg 92.1 kg 91.8 kg    Exam:   General:  [place]/AT  Cardiovascular: RRR no murmur  Respiratory: CLATABL.  Abdomen: Soft / Distended/ generalized pain and bs present, tenderness to palpation.   Musculoskeletal:WNL   Data Reviewed: Basic Metabolic Panel: Recent Labs  Lab 11/23/19 0458 11/24/19 0808 11/25/19 0516 11/26/19 0359 11/27/19 0630  NA 140 136 137 138 135  K 4.2 4.3 4.0 4.2 4.4  CL 101 100 101 104 100  CO2 27 26 29 24 24   GLUCOSE 150* 126* 112* 159* 136*  BUN 23 19 20 20 20   CREATININE 1.10 1.14 1.06 1.21 1.32*  CALCIUM 8.8* 8.8* 8.8* 9.0 9.0  MG 2.1  --  1.9 2.1 1.8  PHOS 3.3  --   --   --   --    Liver Function Tests: Recent Labs  Lab 11/22/19 1623 11/23/19 0458 11/24/19 0808 11/25/19 0516  AST 26 20 24 28   ALT 28 20 19 21   ALKPHOS 100 97 80 87  BILITOT 2.1* 1.8* 1.9* 1.6*  PROT 7.7 6.8 7.0 6.7  ALBUMIN 3.2* 2.7* 2.7* 2.4*   Recent Labs  Lab 11/22/19 1623 11/23/19 0458 11/24/19 0808 11/25/19 0516  LIPASE 86* 57* 44 38   No results for input(s): AMMONIA in the last 168 hours. CBC: Recent Labs  Lab 11/22/19 1623 11/23/19 0458 11/24/19 0808 11/25/19 0516 11/27/19 0630  WBC 22.9* 23.9* 18.6* 12.4* 9.0  NEUTROABS 19.4*  --  15.8* 10.0* 6.3  HGB 16.9 16.2 15.7 15.0 14.8  HCT 50.2 47.0 46.8 46.2 46.4  MCV 95.4 93.8 98.1 97.5 98.3  PLT 203 164 158 155 153   Cardiac Enzymes: No results for input(s): CKTOTAL, CKMB, CKMBINDEX, TROPONINI in the last 168 hours. BNP (last 3 results) Recent Labs    11/12/19 1934  BNP 102.0*    ProBNP (last 3 results) No results for input(s): PROBNP in the last 8760  hours.  CBG: No results for input(s): GLUCAP in the last 168 hours.  Recent Results (from the past 240 hour(s))  SARS CORONAVIRUS 2 (TAT 6-24 HRS) Nasopharyngeal Nasopharyngeal Swab     Status: None   Collection Time: 11/23/19  1:49 AM   Specimen: Nasopharyngeal Swab  Result Value Ref Range Status   SARS Coronavirus 2 NEGATIVE NEGATIVE Final    Comment: (NOTE) SARS-CoV-2 target nucleic acids are NOT DETECTED. The SARS-CoV-2 RNA is generally detectable in upper and lower respiratory specimens during the acute phase of infection. Negative results do not preclude SARS-CoV-2 infection, do not rule out co-infections with other pathogens, and should not be used as the sole basis for treatment or other patient management decisions. Negative results must be combined with clinical observations, patient history, and epidemiological information. The expected result is Negative. Fact Sheet for Patients: SugarRoll.be Fact Sheet for Healthcare Providers: https://www.woods-mathews.com/ This test is  not yet approved or cleared by the [place] and  has been authorized for detection and/or diagnosis of SARS-CoV-2 by FDA under an Emergency Use Authorization (EUA). This EUA will remain  in effect (meaning this test can be used) for the duration of the COVID-19 declaration under Section 56 4(b)(1) of the Act, 21 U.S.C. section 360bbb-3(b)(1), unless the authorization is terminated or revoked sooner. Performed at [facility], [place] [address] [place], [place] [address]      Studies: CT ABDOMEN PELVIS W WO CONTRAST  Result Date: 11/27/2019 CLINICAL DATA:  COVID positive. Renal mass EXAM: CT ABDOMEN AND PELVIS WITHOUT AND WITH CONTRAST TECHNIQUE: Multidetector CT imaging of the abdomen and pelvis was performed following the standard protocol before and following the bolus administration of intravenous contrast. CONTRAST:  128mL OMNIPAQUE IOHEXOL  300 MG/ML  SOLN COMPARISON:  11/22/2019 FINDINGS: Lower chest: There is extensive, multifocal areas of scarring, architectural distortion and nodularity within both lungs compatible with sequelae of multifocal atypical viral infection. Hepatobiliary: There is a small low-density structure within anterior dome of left lobe of liver measuring 5 mm, too small to characterize. No suspicious liver abnormality. The gallbladder appears mildly distended with mild gallbladder wall thickening. No bile duct dilatation. Pancreas: There is persistent edema and inflammation involving the head of pancreas compatible with residual acute focal pancreatitis. No mass or pseudocyst identified. Spleen: Normal in size without focal abnormality. Adrenals/Urinary Tract: Normal appearance of the adrenal glands. Right kidney cyst measures 1.2 cm, image 58/11. Again noted is decreased perfusion and enhancement with parenchymal atrophy involving the mid and inferior pole of left kidney. This is likely sequelae of chronic ischemia. There is preserved, enhancing renal tissue within the upper pole of the left kidney accounting for the masslike appearance on previous CT from 11/22/2019. This area may receive arterial blood supply from an accessory vessel. No suspicious mass identified. Right kidney cyst measures 1.1 cm. Urinary bladder is unremarkable. Stomach/Bowel: Stomach is within normal limits. Appendix not confidently identified. No evidence of bowel wall thickening, distention, or inflammatory changes. Vascular/Lymphatic: Aortic atherosclerosis. Status post stent graft repair of infrarenal abdominal aortic aneurysm. The aneurysm sac measures 3.4 cm on the current exam. No abdominopelvic adenopathy identified. Reproductive: Prostate is unremarkable. Other: Fat containing right inguinal hernia. No free fluid or fluid collections identified. Musculoskeletal: L1-2 and L2-3 degenerative disc disease. IMPRESSION: 1. Persistent edema and  inflammation involving the head of pancreas compatible with residual pancreatitis. No mass or pseudocyst identified. 2. Atrophy with diminished enhancement involving the mid and inferior pole of left kidney compatible with sequelae of chronic ischemia. There is preservation of left upper pole renal cortex with relative normal perfusion and enhancement likely accounting for the appearance of a kidney mass on previous exam. No suspicious kidney lesion identified at this time. 3. Aortic atherosclerosis. 4. Status post stent graft repair of infrarenal abdominal aortic aneurysm. The aneurysm sac measures 3.4 cm on the current exam. 5. Multifocal areas of scarring, architectural distortion and nodularity within both lungs compatible with sequelae of multifocal atypical viral infection. 6. Fat containing right inguinal hernia. Aortic Atherosclerosis (ICD10-I70.0). Electronically Signed   By: Kerby Moors M.D.   On: 11/27/2019 14:15    Scheduled Meds: . allopurinol  300 mg Oral Daily  . atorvastatin  40 mg Oral Daily  . bisacodyl  10 mg Rectal Daily  . carvedilol  12.5 mg Oral Daily  . enoxaparin (LOVENOX) injection  40 mg Subcutaneous Q24H  . lactulose  20 g Oral Once  . pantoprazole  40 mg Oral Daily  . polyethylene glycol  17 g Oral Daily   Time spent: 30 minutes.   [facility]  If 7PM-7AM, please contact night-coverage at [email or web address], password [facility] 11/27/2019, 5:18 PM  LOS: 5 days

## 2019-11-28 LAB — LIPASE, BLOOD: Lipase: 48 U/L (ref 11–51)

## 2019-11-28 LAB — MAGNESIUM: Magnesium: 1.9 mg/dL (ref 1.7–2.4)

## 2019-11-28 LAB — URINE CULTURE: Culture: NO GROWTH

## 2019-11-28 MED ORDER — POLYETHYLENE GLYCOL 3350 17 G PO PACK: 17.0000 g | PACK | Freq: Every day | ORAL | 0 refills | Status: DC | PRN

## 2019-11-28 MED ORDER — TORSEMIDE 20 MG PO TABS: 20.0000 mg | ORAL_TABLET | Freq: Every day | ORAL | 0 refills | Status: DC

## 2019-11-28 NOTE — Discharge Instructions (Signed)
Abdominal Pain, Adult Many things can cause belly (abdominal) pain. Most times, belly pain is not dangerous. Many cases of belly pain can be watched and treated at home. Sometimes, though, belly pain is serious. Your doctor will try to find the cause of your belly pain. Follow these instructions at home:  Medicines  Take over-the-counter and prescription medicines only as told by your doctor.  Do not take medicines that help you poop (laxatives) unless told by your doctor. General instructions  Watch your belly pain for any changes.  Drink enough fluid to keep your pee (urine) pale yellow.  Keep all follow-up visits as told by your doctor. This is important. Contact a doctor if:  Your belly pain changes or gets worse.  You are not hungry, or you lose weight without trying.  You are having trouble pooping (constipated) or have watery poop (diarrhea) for more than 2-3 days.  You have pain when you pee or poop.  Your belly pain wakes you up at night.  Your pain gets worse with meals, after eating, or with certain foods.  You are vomiting and cannot keep anything down.  You have a fever.  You have blood in your pee. Get help right away if:  Your pain does not go away as soon as your doctor says it should.  You cannot stop vomiting.  Your pain is only in areas of your belly, such as the right side or the left lower part of the belly.  You have bloody or black poop, or poop that looks like tar.  You have very bad pain, cramping, or bloating in your belly.  You have signs of not having enough fluid or water in your body (dehydration), such as: ? Dark pee, very little pee, or no pee. ? Cracked lips. ? Dry mouth. ? Sunken eyes. ? Sleepiness. ? Weakness.  You have trouble breathing or chest pain. Summary  Many cases of belly pain can be watched and treated at home.  Watch your belly pain for any changes.  Take over-the-counter and prescription medicines only as  told by your doctor.  Contact a doctor if your belly pain changes or gets worse.  Get help right away if you have very bad pain, cramping, or bloating in your belly. This information is not intended to replace advice given to you by your health care provider. Make sure you discuss any questions you have with your health care provider. Document Revised: 03/22/2019 Document Reviewed: 03/22/2019 Elsevier Patient Education  2020 [employer].   Acute Pancreatitis  The pancreas is a gland that is located behind the stomach on the left side of the abdomen. It produces enzymes that help to digest food. The pancreas also releases the hormones glucagon and insulin, which help to regulate blood sugar. Acute pancreatitis happens when inflammation of the pancreas suddenly occurs and the pancreas becomes irritated and swollen. Most acute attacks last a few days and cause serious problems. Some people become dehydrated and develop low blood pressure. In severe cases, bleeding in the abdomen can lead to shock and can be life-threatening. The lungs, heart, and kidneys may fail. What are the causes? This condition may be caused by:  Alcohol abuse.  Drug abuse.  Gallstones or other conditions that can block the tube that drains the pancreas (pancreatic duct).  A tumor in the pancreas. Other causes include:  Certain medicines.  Exposure to certain chemicals.  Diabetes.  An infection in the pancreas.  Damage caused by an  accident (trauma).  The poison (venom) from a scorpion bite.  Abdominal surgery.  Autoimmune pancreatitis. This is when the body's disease-fighting (immune) system attacks the pancreas.  Genes that are passed from parent to child (inherited). In some cases, the cause of this condition is not known. What are the signs or symptoms? Symptoms of this condition include:  Pain in the upper abdomen that may radiate to the back. Pain may be severe.  Tenderness and swelling of  the abdomen.  Nausea and vomiting.  Fever. How is this diagnosed? This condition may be diagnosed based on:  A physical exam.  Blood tests.  Imaging tests, such as X-rays, CT or MRI scans, or an ultrasound of the abdomen. How is this treated? Treatment for this condition usually requires a stay in the hospital. Treatment for this condition may include:  Pain medicine.  Fluid replacement through an IV.  Placing a tube in the stomach to remove stomach contents and to control vomiting (NG tube, or nasogastric tube).  Not eating for 3-4 days. This gives the pancreas a rest, because enzymes are not being produced that can cause further damage.  Antibiotic medicines, if your condition is caused by an infection.  Treating any underlying conditions that may be the cause.  Steroid medicines, if your condition is caused by your immune system attacking your body's own tissues (autoimmune disease).  Surgery on the pancreas or gallbladder. Follow these instructions at home: Eating and drinking   Follow instructions from your health care provider about diet. This may involve avoiding alcohol and decreasing the amount of fat in your diet.  Eat smaller, more frequent meals. This reduces the amount of digestive fluids that the pancreas produces.  Drink enough fluid to keep your urine pale yellow.  Do not drink alcohol if it caused your condition. General instructions  Take over-the-counter and prescription medicines only as told by your health care provider.  Do not drive or use heavy machinery while taking prescription pain medicine.  Ask your health care provider if the medicine prescribed to you can cause constipation. You may need to take steps to prevent or treat constipation, such as: ? Take an over-the-counter or prescription medicine for constipation. ? Eat foods that are high in fiber such as whole grains and beans. ? Limit foods that are high in fat and processed sugars,  such as fried or sweet foods.  Do not use any products that contain nicotine or tobacco, such as cigarettes, e-cigarettes, and chewing tobacco. If you need help quitting, ask your health care provider.  Get plenty of rest.  If directed, check your blood sugar at home as told by your health care provider.  Keep all follow-up visits as told by your health care provider. This is important. Contact a health care provider if you:  Do not recover as quickly as expected.  Develop new or worsening symptoms.  Have persistent pain, weakness, or nausea.  Recover and then have another episode of pain.  Have a fever. Get help right away if:  You cannot eat or keep fluids down.  Your pain becomes severe.  Your skin or the white part of your eyes turns yellow (jaundice).  You have sudden swelling in your abdomen.  You vomit.  You feel dizzy or you faint.  Your blood sugar is high (over 300 mg/dL). Summary  Acute pancreatitis happens when inflammation of the pancreas suddenly occurs and the pancreas becomes irritated and swollen.  This condition is typically caused by  alcohol abuse, drug abuse, or gallstones.  Treatment for this condition usually requires a stay in the hospital. This information is not intended to replace advice given to you by your health care provider. Make sure you discuss any questions you have with your health care provider. Document Revised: 08/31/2018 Document Reviewed: 05/18/2018 Elsevier Patient Education  [place].  Constipation, Adult Constipation is when a person:  Poops (has a bowel movement) fewer times in a week than normal.  Has a hard time pooping.  Has poop that is dry, hard, or bigger than normal. Follow these instructions at home: Eating and drinking   Eat foods that have a lot of fiber, such as: ? Fresh fruits and vegetables. ? Whole grains. ? Beans.  Eat less of foods that are high in fat, low in fiber, or overly  processed, such as: ? [place] fries. ? Hamburgers. ? Cookies. ? Candy. ? Soda.  Drink enough fluid to keep your pee (urine) clear or pale yellow. General instructions  Exercise regularly or as told by your doctor.  Go to the restroom when you feel like you need to poop. Do not hold it in.  Take over-the-counter and prescription medicines only as told by your doctor. These include any fiber supplements.  Do pelvic floor retraining exercises, such as: ? Doing deep breathing while relaxing your lower belly (abdomen). ? Relaxing your pelvic floor while pooping.  Watch your condition for any changes.  Keep all follow-up visits as told by your doctor. This is important. Contact a doctor if:  You have pain that gets worse.  You have a fever.  You have not pooped for 4 days.  You throw up (vomit).  You are not hungry.  You lose weight.  You are bleeding from the anus.  You have thin, pencil-like poop (stool). Get help right away if:  You have a fever, and your symptoms suddenly get worse.  You leak poop or have blood in your poop.  Your belly feels hard or bigger than normal (is bloated).  You have very bad belly pain.  You feel dizzy or you faint. This information is not intended to replace advice given to you by your health care provider. Make sure you discuss any questions you have with your health care provider. Document Revised: 10/24/2017 Document Reviewed: 05/01/2016 Elsevier Patient Education  2020 [employer].

## 2019-11-29 ENCOUNTER — Telehealth: Payer: Self-pay

## 2019-11-29 DIAGNOSIS — J9620 Acute and chronic respiratory failure, unspecified whether with hypoxia or hypercapnia: Secondary | ICD-10-CM | POA: Diagnosis not present

## 2019-11-29 NOTE — Telephone Encounter (Signed)
Transition Care Management Follow-up Telephone Call  Date of discharge and from where: [facility] on 11/28/19.  How have you been since you were released from the hospital? Doing ok but feels like his balance is off. Advised pt to use his cane and walker to get around. Has been SOB but his oxygen level has been ranging from 95-97%. Declines abdominal pain, fever, cough, congestion, weakness or n/v/d.  Any questions or concerns? No   Items Reviewed:  Did the pt receive and understand the discharge instructions provided? Yes   Medications obtained and verified? Yes   Any new allergies since your discharge? No   Dietary orders reviewed? Yes  Do you have support at home? Yes   Other (ie: DME, Home Health, etc) N/A  Functional Questionnaire: (I = Independent and D = Dependent)  Bathing/Dressing- I   Meal Prep- I  Eating- I  Maintaining continence- I  Transferring/Ambulation- I, advised to use a walker or cane currently.   Managing Meds- I   Follow up appointments reviewed:    PCP Hospital f/u appt confirmed? Yes  scheduled to see Dr [name] Section on 12/06/19 @ 2:40 PM.  [facility] f/u appt confirmed? N/A  Are transportation arrangements needed? No   If their condition worsens, is the pt aware to call  their PCP or go to the ED? Yes  Was the patient provided with contact information for the PCP's office or ED? Yes  Was the pt encouraged to call back with questions or concerns? Yes

## 2019-11-29 NOTE — Telephone Encounter (Signed)
Tried calling patient to schedule appointment. Left message to call back. OK for P[facility] to advise patient and schedule appointment for next same day slot. There is an opening on 12/01/2019 at 10:40am. OK to schedule patient for this day.

## 2019-11-29 NOTE — Telephone Encounter (Signed)
Pt calling this am to make hospital follow up. No appt available.  Please call pt to schedule. Nothing available.

## 2019-11-29 NOTE — Telephone Encounter (Signed)
He can have a same day slot.  Thanks!

## 2019-11-29 NOTE — Telephone Encounter (Signed)
There are no available HFU apts on PCP's schedule in the next 1-2 weeks. Ok to schedule with another provider?

## 2019-11-29 NOTE — Discharge Summary (Signed)
Physician Discharge Summary  Patient ID: NTHONY LEFFERTS MRN: [id number] DOB/AGE: May 28, 1941 79 y.o.  Admit date: 11/22/2019 Discharge date: 11/29/2019  Admission Diagnoses:  Discharge Diagnoses:  Active Problems:   Cardiac defibrillator in place   Systolic CHF with reduced left ventricular function, NYHA class 3 (HCC)   Chronic kidney disease (CKD), stage III (moderate)   Hypercholesteremia   Obstructive sleep apnea   History of CVA (cerebrovascular accident)   History of AAA (abdominal aortic aneurysm) repair   Suspected COVID-19 virus infection   Pancreatitis   Leukocytosis   Abdominal pain   Discharged Condition: fair  Hospital Course:  REICE BIENVENUE is a 79 y.o. male with positive IgG for Covid admitted with abdominal pain and diagnosed with pancreatitis based on imaging.  Patient recently was treated for pneumonia with a course of prednisone and Bactrim.  He describes the pain as bilateral lower quadrant, dull, 5/10.  Never had pain in his upper abdomen.  No nausea or vomiting.  Does not have any pain at the time of my evaluation but did have some yesterday.   .Abdominal Pain: Improved to resolved following BM (a couple of times) - Thought be due to constipation as well as pancreatitis  - Pt has a Hx of chronic narcotic use as per Pt -Pt had ct of abd on 11/22/19 which showed multiple abnormal findings , results of which were d/w wife ms patsy today. With plan to reconnect once ct abd /pelvis is resulted.Repeat Ct was done today whic showed no suspicious lesion. Infrarenal graft visualized and persistent edema of pancrease which I suspect is reason for pain.  .Pancreatitis - Resolved;lipase improved to normal On admission and repeatCT of abdomen - acute pancreatitis no evidence of pseudocyst Status postIV hydration closely due to his severe systolic CHF with a EF of 20 to 25%.  -Consulted GI service due to patient's still experiencing disproportional abdominal  pain -Abdominal x-ray shows no ileus or obstruction but large amount of stool - bowel regimen as per GI recommendation -Check lipid panelTriglycerides 182 -Discontinued potential offending agentSuch as bactrim ( pt finished course) - Had BM multiple times and Pain improved significantly   .Systolic CHF: Currently stable - with reduced left ventricular function, NYHA class 3 (HCC) sp -Cardiac defibrillator in place - Avoid over aggressive fluid resuscitation will monitor fluid status carefully  .AKI on chronic kidney disease(CKD), stage III (moderate) - remained stable  - avoid nephrotoxic medications -ivf hydration for abnormal creatinine x 24 hour - Advised to f/u OP PCP and repeat lab   .HTN: stable  Losartan was d/c due to intermittent AKI - Resume 50mg  daily  - F/U OP   .Hypercholesteremia -stable continue home meds if able to tolerate  .Obstructive sleep apnea: Stable   .Suspected COVID-19 virus infection-does not have fever or shortness of breath or any URI symptoms -unclear if fully asymptomatic Covid-19 swab is negative.   .Leukocytosis- Resolved  in the setting of recent steroids -No fevers or dysuria  Renal mass - Hx of  - Repeat imaging pt does not have a renal mass.    Consults: GI  Significant Diagnostic Studies: Radiology and Blood work   Treatments: As per hospital course and d/c medlist   Discharge Exam: Blood pressure (!) 163/85, pulse 61, temperature 97.6 F (36.4 C), temperature source Oral, resp. rate 19, height 5\' 10"  (1.778 m), weight 90.6 kg, SpO2 95 %.   General:  [place]/AT  Cardiovascular: RRR no murmur  Respiratory: CLATABL.  Abdomen:  Soft/ nondistended; + BS; nontender mostly   Musculoskeletal:WNL    Disposition: Discharge disposition: 01-Home or Self Care     Stable to d/c home with close f/u with PCP. Advised to f/u with his cardiologist. Also may need a renal ref from PCP based on repeat labs if  deemed necessary.  - Advised to cont low fat and low cholesterol diet  - warning S/S explained to Pt for which he must seek medical attention. Pt expressed understanding   Discharge Instructions    Call MD for:  difficulty breathing, headache or visual disturbances   Complete by: As directed    Call MD for:  extreme fatigue   Complete by: As directed    Diet - low sodium heart healthy   Complete by: As directed    Hi fibre diet   Increase activity slowly   Complete by: As directed      Allergies as of 11/28/2019      Reactions   Amlodipine Besylate Swelling   Had a reaction when taking with colcrys    Crestor [rosuvastatin]    Muscle cramps and pain   Rocephin [ceftriaxone]    unknown      Medication List    STOP taking these medications   sulfamethoxazole-trimethoprim 400-80 MG tablet Commonly known as: BACTRIM     TAKE these medications   allopurinol 300 MG tablet Commonly known as: ZYLOPRIM Take 2 tablets (600 mg total) by mouth daily.   aspirin 81 MG EC tablet Take 81 mg by mouth daily.   atorvastatin 40 MG tablet Commonly known as: LIPITOR TAKE 1 TABLET BY MOUTH EVERY DAY   carvedilol 12.5 MG tablet Commonly known as: COREG TAKE 1 TABLET BY MOUTH DAILY   fluticasone 50 MCG/ACT nasal spray Commonly known as: FLONASE Place 2 sprays into both nostrils daily.   indomethacin 50 MG capsule Commonly known as: INDOCIN Take 1 capsule (50 mg total) by mouth 3 (three) times daily as needed (for gout flares).   losartan 50 MG tablet Commonly known as: COZAAR TAKE 2 TABLETS(100 MG) BY MOUTH DAILY What changed: See the new instructions.   pantoprazole 40 MG tablet Commonly known as: PROTONIX TAKE 1 TABLET BY MOUTH EVERY DAY   polyethylene glycol 17 g packet Commonly known as: MIRALAX / GLYCOLAX Take 17 g by mouth daily as needed for mild constipation or moderate constipation.   predniSONE 20 MG tablet Commonly known as: DELTASONE Take 2 tablets (40 mg  total) by mouth daily with breakfast.   torsemide 20 MG tablet Commonly known as: DEMADEX Take 1 tablet (20 mg total) by mouth daily for 7 days. What changed: See the new instructions.   Vitamin B-12 CR 1000 MCG Tbcr Take 1,000 mcg by mouth daily.      Follow-up Information    Birdie Sons, MD. Schedule an appointment as soon as possible for a visit in 1 week(s).   Specialty: Family Medicine Why: f/u BMP/ renal function  Contact information: [address] [place] [address] [phone]           Signed: Thornell Mule 11/29/2019, 8:21 PM

## 2019-11-29 NOTE — Telephone Encounter (Signed)
No HFU scheduled.  

## 2019-12-04 DIAGNOSIS — G4730 Sleep apnea, unspecified: Secondary | ICD-10-CM | POA: Diagnosis not present

## 2019-12-06 ENCOUNTER — Encounter: Payer: Self-pay | Admitting: Family Medicine

## 2019-12-06 ENCOUNTER — Other Ambulatory Visit: Payer: Self-pay

## 2019-12-06 ENCOUNTER — Ambulatory Visit (INDEPENDENT_AMBULATORY_CARE_PROVIDER_SITE_OTHER): Payer: Medicare Other | Admitting: Family Medicine

## 2019-12-06 VITALS — BP 110/71 | HR 69 | Temp 96.8°F | Wt 208.4 lb

## 2019-12-06 DIAGNOSIS — I5020 Unspecified systolic (congestive) heart failure: Secondary | ICD-10-CM

## 2019-12-06 DIAGNOSIS — K858 Other acute pancreatitis without necrosis or infection: Secondary | ICD-10-CM | POA: Diagnosis not present

## 2019-12-06 DIAGNOSIS — L4050 Arthropathic psoriasis, unspecified: Secondary | ICD-10-CM

## 2019-12-06 DIAGNOSIS — R0602 Shortness of breath: Secondary | ICD-10-CM

## 2019-12-06 DIAGNOSIS — J9601 Acute respiratory failure with hypoxia: Secondary | ICD-10-CM | POA: Diagnosis not present

## 2019-12-06 DIAGNOSIS — J8489 Other specified interstitial pulmonary diseases: Secondary | ICD-10-CM

## 2019-12-06 MED ORDER — BUDESONIDE-FORMOTEROL FUMARATE 160-4.5 MCG/ACT IN AERO: 2.0000 | INHALATION_SPRAY | Freq: Two times a day (BID) | RESPIRATORY_TRACT | 3 refills | Status: DC

## 2019-12-06 NOTE — Progress Notes (Signed)
Patient: Taylor [name] Male    DOB: 03-09-1941   79 y.o.   MRN: [id number] Visit Date: 12/06/2019  Today's Provider: Lelon Huh, MD   Chief Complaint  Patient presents with  . Hospitalization Follow-up   Subjective:     HPI  Follow up Hospitalization  Patient was admitted to [facility] on 11/22/2019 and discharged on 11/28/2019. He was treated for AKI on Chronic Kidney disease, pancreatitis and constipation. His lipase normalized prior to discharge and his abdominal completely resolved after having several bowel movements in the hospital. .   Treatment for this included discontinuing Losartan due to intermittent AKI Upon discharged he was to resume 50mg  daily. Patient was advised to follow up with PCP to repeat labs.  Telephone follow up was done on 11/29/2019 He reports good compliance with treatment.  He also had Covid antibodies and has been on supplemental O2. He has been on prednisone since last month due post covid respiratory symptoms.  He reports this condition is slightly improved. He C/O SOB and weakness . Has follow up pulmonology on 12/09/2019 Is using nebulizer twice a day that he belonged to to his brother which helps for a few hours.  ------------------------------------------------------------------------------------    Allergies  Allergen Reactions  . Amlodipine Besylate Swelling    Had a reaction when taking with colcrys   . Crestor [Rosuvastatin]     Muscle cramps and pain  . Rocephin [Ceftriaxone]     unknown     Current Outpatient Medications:  .  allopurinol (ZYLOPRIM) 300 MG tablet, Take 2 tablets (600 mg total) by mouth daily., Disp: 60 tablet, Rfl: 12 .  aspirin 81 MG EC tablet, Take 81 mg by mouth daily. , Disp: , Rfl:  .  atorvastatin (LIPITOR) 40 MG tablet, TAKE 1 TABLET BY MOUTH EVERY DAY, Disp: 90 tablet, Rfl: 4 .  carvedilol (COREG) 12.5 MG tablet, TAKE 1 TABLET BY MOUTH DAILY, Disp: 90 tablet, Rfl: 4 .  Cyanocobalamin (VITAMIN B-12  CR) 1000 MCG TBCR, Take 1,000 mcg by mouth daily. , Disp: , Rfl:  .  fluticasone (FLONASE) 50 MCG/ACT nasal spray, Place 2 sprays into both nostrils daily., Disp: 16 g, Rfl: 0 .  indomethacin (INDOCIN) 50 MG capsule, Take 1 capsule (50 mg total) by mouth 3 (three) times daily as needed (for gout flares)., Disp: 30 capsule, Rfl: 1 .  losartan (COZAAR) 50 MG tablet, TAKE 2 TABLETS(100 MG) BY MOUTH DAILY (Patient taking differently: Take 50 mg by mouth daily. ), Disp: 90 tablet, Rfl: 4 .  pantoprazole (PROTONIX) 40 MG tablet, TAKE 1 TABLET BY MOUTH EVERY DAY, Disp: 90 tablet, Rfl: 4 .  polyethylene glycol (MIRALAX / GLYCOLAX) 17 g packet, Take 17 g by mouth daily as needed for mild constipation or moderate constipation., Disp: 14 each, Rfl: 0 .  predniSONE (DELTASONE) 20 MG tablet, Take 2 tablets (40 mg total) by mouth daily with breakfast., Disp: 60 tablet, Rfl: 0 .  torsemide (DEMADEX) 20 MG tablet, Take 1 tablet (20 mg total) by mouth daily for 7 days., Disp: 7 tablet, Rfl: 0  Review of Systems  Constitutional: Negative for appetite change, chills and fever.  Respiratory: Positive for shortness of breath. Negative for chest tightness and wheezing.   Cardiovascular: Negative for chest pain and palpitations.  Gastrointestinal: Negative for abdominal pain, nausea and vomiting.  Neurological: Positive for weakness.    Social History   Tobacco Use  . Smoking status: Former Smoker  Packs/day: 1.00    Types: Cigarettes    Quit date: 11/26/1999    Years since quitting: 20.0  . Smokeless tobacco: Never Used  Substance Use Topics  . Alcohol use: No      Objective:   BP 110/71 (BP Location: Right Arm, Patient Position: Sitting, Cuff Size: Normal)   Pulse 69   Temp (!) 96.8 F (36 C) (Temporal)   Wt 208 lb 6.4 oz (94.5 kg)   SpO2 98%   BMI 29.90 kg/m  SpO2 = 95% room air  Physical Exam   General Appearance:    Well developed, well nourished male in no acute distress  Eyes:    PERRL,  conjunctiva/corneas clear, EOM's intact       Lungs:     Clear to auscultation bilaterally, respirations unlabored  Heart:    Normal heart rate. Normal rhythm. No murmurs, rubs, or gallops.   MS:   All extremities are intact.   Neurologic:   Awake, alert, oriented x 3. No apparent focal neurological           defect.        No results found for any visits on 12/06/19.     Assessment & Plan    1. Other acute pancreatitis, unspecified complication status Resolved by the time he was recently discharged  2. Shortness of breath Post covid. Oximetry is good. Continue prednisone until his upcoming pulmonary follow up   3. Psoriatic arthritis ([place])   4. Systolic CHF with reduced left ventricular function, NYHA class 3 ([place])  - Renal Function Panel  5. Acute respiratory failure with hypoxia (HCC)   6. Other specified interstitial pulmonary diseases (HCC) Add samples - budesonide-formoterol (SYMBICORT) 160-4.5 MCG/ACT inhaler; Inhale 2 puffs into the lungs 2 (two) times daily.  Dispense: 1 Inhaler; Refill: 3     Lelon Huh, MD  [facility]

## 2019-12-07 ENCOUNTER — Telehealth: Payer: Self-pay

## 2019-12-07 LAB — RENAL FUNCTION PANEL
Albumin: 3.8 g/dL (ref 3.7–4.7)
BUN/Creatinine Ratio: 31 — ABNORMAL HIGH (ref 10–24)
BUN: 48 mg/dL — ABNORMAL HIGH (ref 8–27)
CO2: 23 mmol/L (ref 20–29)
Calcium: 9.2 mg/dL (ref 8.6–10.2)
Chloride: 96 mmol/L (ref 96–106)
Creatinine, Ser: 1.57 mg/dL — ABNORMAL HIGH (ref 0.76–1.27)
GFR calc Af Amer: 48 mL/min/{1.73_m2} — ABNORMAL LOW (ref >59)
GFR calc non Af Amer: 42 mL/min/{1.73_m2} — ABNORMAL LOW (ref >59)
Glucose: 370 mg/dL — ABNORMAL HIGH (ref 65–99)
Phosphorus: 3.8 mg/dL (ref 2.8–4.1)
Potassium: 4.2 mmol/L (ref 3.5–5.2)
Sodium: 137 mmol/L (ref 134–144)

## 2019-12-07 NOTE — Telephone Encounter (Signed)
Patient re-advised of recommendations from lab results on 12/06/2019

## 2019-12-07 NOTE — Telephone Encounter (Signed)
Copied from [place] [phone]. Topic: General - Other >> Dec 07, 2019  2:03 PM Leward Quan A wrote: Reason for CRM: Patient called to get clarification on a call that he received states that he was told to start a low sugar diet, cut his Prednisone in half and something else. Per patient he is not sure who the person was that called him. Asking for a call back at Ph#  [phone]

## 2019-12-09 DIAGNOSIS — J208 Acute bronchitis due to other specified organisms: Secondary | ICD-10-CM | POA: Diagnosis not present

## 2019-12-09 DIAGNOSIS — U071 COVID-19: Secondary | ICD-10-CM | POA: Diagnosis not present

## 2019-12-12 ENCOUNTER — Other Ambulatory Visit: Payer: Self-pay | Admitting: Family Medicine

## 2019-12-12 DIAGNOSIS — J0110 Acute frontal sinusitis, unspecified: Secondary | ICD-10-CM

## 2019-12-14 ENCOUNTER — Other Ambulatory Visit: Payer: Self-pay

## 2019-12-14 ENCOUNTER — Ambulatory Visit (INDEPENDENT_AMBULATORY_CARE_PROVIDER_SITE_OTHER): Payer: Medicare Other | Admitting: Family Medicine

## 2019-12-14 ENCOUNTER — Encounter: Payer: Self-pay | Admitting: Family Medicine

## 2019-12-14 VITALS — BP 110/60 | HR 68 | Temp 96.9°F | Resp 16 | Wt 205.0 lb

## 2019-12-14 DIAGNOSIS — J984 Other disorders of lung: Secondary | ICD-10-CM

## 2019-12-14 DIAGNOSIS — R739 Hyperglycemia, unspecified: Secondary | ICD-10-CM

## 2019-12-14 DIAGNOSIS — E099 Drug or chemical induced diabetes mellitus without complications: Secondary | ICD-10-CM | POA: Diagnosis not present

## 2019-12-14 DIAGNOSIS — T380X5A Adverse effect of glucocorticoids and synthetic analogues, initial encounter: Secondary | ICD-10-CM

## 2019-12-14 DIAGNOSIS — Z9981 Dependence on supplemental oxygen: Secondary | ICD-10-CM

## 2019-12-14 LAB — POCT GLYCOSYLATED HEMOGLOBIN (HGB A1C)
Est. average glucose Bld gHb Est-mCnc: 174
Hemoglobin A1C: 7.7 % — AB (ref 4.0–5.6)

## 2019-12-14 LAB — GLUCOSE, POCT (MANUAL RESULT ENTRY): POC Glucose: 273 mg/dl — AB (ref 70–99)

## 2019-12-14 MED ORDER — ONETOUCH VERIO VI STRP: ORAL_STRIP | 12 refills | Status: DC

## 2019-12-14 NOTE — Progress Notes (Deleted)
Patient: Taylor [name] Male    DOB: May 16, 1941   79 y.o.   MRN: [id number] Visit Date: 12/14/2019  Today's Provider: Lelon Huh, MD   Chief Complaint  Patient presents with  . Follow-up  . Medication Change  . Diabetes   Subjective:     From 12/06/19 Blood sugar is high, which is a side effect of prednisone, reduce prednisone to 1/2 tablet daily Kidney functions are a little worse. strictly avoid sweets and starchy foods, drink at least 6 glasses of water a day, and follow up in a week to check blood sugar.   HPI  Allergies  Allergen Reactions  . Amlodipine Besylate Swelling    Had a reaction when taking with colcrys   . Crestor [Rosuvastatin]     Muscle cramps and pain  . Rocephin [Ceftriaxone]     unknown     Current Outpatient Medications:  .  allopurinol (ZYLOPRIM) 300 MG tablet, Take 2 tablets (600 mg total) by mouth daily., Disp: 60 tablet, Rfl: 12 .  aspirin 81 MG EC tablet, Take 81 mg by mouth daily. , Disp: , Rfl:  .  atorvastatin (LIPITOR) 40 MG tablet, TAKE 1 TABLET BY MOUTH EVERY DAY, Disp: 90 tablet, Rfl: 4 .  budesonide-formoterol (SYMBICORT) 160-4.5 MCG/ACT inhaler, Inhale 2 puffs into the lungs 2 (two) times daily., Disp: 1 Inhaler, Rfl: 3 .  carvedilol (COREG) 12.5 MG tablet, TAKE 1 TABLET BY MOUTH DAILY, Disp: 90 tablet, Rfl: 4 .  COMBIVENT RESPIMAT 20-100 MCG/ACT AERS respimat, SMARTSIG:2 Inhalation Via Inhaler 4 Times Daily PRN, Disp: , Rfl:  .  Cyanocobalamin (VITAMIN B-12 CR) 1000 MCG TBCR, Take 1,000 mcg by mouth daily. , Disp: , Rfl:  .  fluticasone (FLONASE) 50 MCG/ACT nasal spray, SHAKE LIQUID AND USE 2 SPRAYS IN EACH NOSTRIL DAILY, Disp: 48 g, Rfl: 4 .  indomethacin (INDOCIN) 50 MG capsule, Take 1 capsule (50 mg total) by mouth 3 (three) times daily as needed (for gout flares)., Disp: 30 capsule, Rfl: 1 .  losartan (COZAAR) 50 MG tablet, TAKE 2 TABLETS(100 MG) BY MOUTH DAILY (Patient taking differently: Take 50 mg by mouth daily. ), Disp:  90 tablet, Rfl: 4 .  nystatin (MYCOSTATIN) 100000 UNIT/ML suspension, Take 5 mLs by mouth 4 (four) times daily., Disp: , Rfl:  .  pantoprazole (PROTONIX) 40 MG tablet, TAKE 1 TABLET BY MOUTH EVERY DAY, Disp: 90 tablet, Rfl: 4 .  polyethylene glycol (MIRALAX / GLYCOLAX) 17 g packet, Take 17 g by mouth daily as needed for mild constipation or moderate constipation., Disp: 14 each, Rfl: 0 .  predniSONE (DELTASONE) 20 MG tablet, Take 2 tablets (40 mg total) by mouth daily with breakfast., Disp: 60 tablet, Rfl: 0 .  torsemide (DEMADEX) 20 MG tablet, Take 1 tablet (20 mg total) by mouth daily for 7 days., Disp: 7 tablet, Rfl: 0  Review of Systems  Social History   Tobacco Use  . Smoking status: Former Smoker    Packs/day: 1.00    Types: Cigarettes    Quit date: 11/26/1999    Years since quitting: 20.0  . Smokeless tobacco: Never Used  Substance Use Topics  . Alcohol use: No      Objective:   There were no vitals taken for this visit. There were no vitals filed for this visit.There is no height or weight on file to calculate BMI.   Physical Exam   No results found for any visits on 12/14/19.  Assessment & Plan        Lelon Huh, MD  [facility]

## 2019-12-14 NOTE — Patient Instructions (Signed)
.   Please review the attached list of medications and notify my office if there are any errors.   . Please bring all of your medications to every appointment so we can make sure that our medication list is the same as yours.   Continue 1/2 tablet of prednisone each day until the end of January, then stop prednisone. Call if you notice any worsening.

## 2019-12-14 NOTE — Progress Notes (Signed)
Patient: Taylor [name] Male    DOB: [birth year]   79 y.o.   MRN: [id number] Visit Date: 12/14/2019  Today's Provider: Lelon Huh, MD   Chief Complaint  Patient presents with  . Follow-up  . Medication Change  . Hyperglycemia   Subjective:     HPI  Hyperglycemia, Follow-up:   Lab Results  Component Value Date   HGBA1C 7.7 (A) 12/14/2019   HGBA1C 6.3 (H) 05/12/2019   HGBA1C 6.3 (H) 04/02/2018   GLUCOSE 370 (H) 12/06/2019   GLUCOSE 136 (H) 11/27/2019   GLUCOSE 159 (H) 11/26/2019    Last seen for for this1 weeks ago.  Management since that visit includes advising patient to strictly avoid sweets and starchy foods, reduce prednisone to 1/2 tablet daily, drink at least 6 glasses of water daily, and follow up in 1 week to checked blood sugars. His sugar was 370 on labs last week. He has a history of pre-diabetes, but does not check sugars regularly. He did have follow up with pulmonary for post Covid lung disease, but was not given any specific instruction on how long to stay on prednisone. He does feel inhalers are working well.  Current symptoms include none and have been stable.  Weight trend: fluctuating a bit Prior visit with dietician: no Current diet: well balanced Current exercise: walking  Pertinent Labs:    Component Value Date/Time   CHOL 156 11/22/2019 1623   CHOL 110 05/12/2019 0914   TRIG 182 (H) 11/22/2019 1623   CHOLHDL 3.6 11/22/2019 1623   CREATININE 1.57 (H) 12/06/2019 1515    Wt Readings from Last 3 Encounters:  12/14/19 205 lb (93 kg)  12/06/19 208 lb 6.4 oz (94.5 kg)  11/28/19 199 lb 12.8 oz (90.6 kg)    Allergies  Allergen Reactions  . Amlodipine Besylate Swelling    Had a reaction when taking with colcrys   . Crestor [Rosuvastatin]     Muscle cramps and pain  . Rocephin [Ceftriaxone]     unknown     Current Outpatient Medications:  .  allopurinol (ZYLOPRIM) 300 MG tablet, Take 2 tablets (600 mg total) by mouth daily., Disp:  60 tablet, Rfl: 12 .  aspirin 81 MG EC tablet, Take 81 mg by mouth daily. , Disp: , Rfl:  .  atorvastatin (LIPITOR) 40 MG tablet, TAKE 1 TABLET BY MOUTH EVERY DAY, Disp: 90 tablet, Rfl: 4 .  budesonide-formoterol (SYMBICORT) 160-4.5 MCG/ACT inhaler, Inhale 2 puffs into the lungs 2 (two) times daily., Disp: 1 Inhaler, Rfl: 3 .  carvedilol (COREG) 12.5 MG tablet, TAKE 1 TABLET BY MOUTH DAILY, Disp: 90 tablet, Rfl: 4 .  COMBIVENT RESPIMAT 20-100 MCG/ACT AERS respimat, SMARTSIG:2 Inhalation Via Inhaler 4 Times Daily PRN, Disp: , Rfl:  .  Cyanocobalamin (VITAMIN B-12 CR) 1000 MCG TBCR, Take 1,000 mcg by mouth daily. , Disp: , Rfl:  .  fluticasone (FLONASE) 50 MCG/ACT nasal spray, SHAKE LIQUID AND USE 2 SPRAYS IN EACH NOSTRIL DAILY, Disp: 48 g, Rfl: 4 .  indomethacin (INDOCIN) 50 MG capsule, Take 1 capsule (50 mg total) by mouth 3 (three) times daily as needed (for gout flares)., Disp: 30 capsule, Rfl: 1 .  losartan (COZAAR) 50 MG tablet, TAKE 2 TABLETS(100 MG) BY MOUTH DAILY (Patient taking differently: Take 50 mg by mouth daily. ), Disp: 90 tablet, Rfl: 4 .  nystatin (MYCOSTATIN) 100000 UNIT/ML suspension, Take 5 mLs by mouth 4 (four) times daily., Disp: , Rfl:  .  pantoprazole (PROTONIX) 40 MG tablet, TAKE 1 TABLET BY MOUTH EVERY DAY, Disp: 90 tablet, Rfl: 4 .  polyethylene glycol (MIRALAX / GLYCOLAX) 17 g packet, Take 17 g by mouth daily as needed for mild constipation or moderate constipation., Disp: 14 each, Rfl: 0 .  predniSONE (DELTASONE) 20 MG tablet, Take 2 tablets (40 mg total) by mouth daily with breakfast. (Patient taking differently: Take 10 mg by mouth daily with breakfast. ), Disp: 60 tablet, Rfl: 0 .  torsemide (DEMADEX) 20 MG tablet, Take 1 tablet (20 mg total) by mouth daily for 7 days., Disp: 7 tablet, Rfl: 0  Review of Systems  Constitutional: Negative for appetite change, chills and fever.  Respiratory: Negative for chest tightness, shortness of breath and wheezing.     Cardiovascular: Negative for chest pain and palpitations.  Gastrointestinal: Negative for abdominal pain, nausea and vomiting.    Social History   Tobacco Use  . Smoking status: Former Smoker    Packs/day: 1.00    Types: Cigarettes    Quit date: 11/26/1999    Years since quitting: 20.0  . Smokeless tobacco: Never Used  Substance Use Topics  . Alcohol use: No      Objective:   BP 110/60 (BP Location: Left Arm, Patient Position: Sitting, Cuff Size: Normal)   Pulse 68   Temp (!) 96.9 F (36.1 C) (Temporal)   Resp 16   Wt 205 lb (93 kg)   SpO2 98% Comment: room air  BMI 29.41 kg/m  Vitals:   12/14/19 1016  BP: 110/60  Pulse: 68  Resp: 16  Temp: (!) 96.9 F (36.1 C)  TempSrc: Temporal  SpO2: 98%  Weight: 205 lb (93 kg)  Body mass index is 29.41 kg/m.   Physical Exam  . General Appearance:    Overweight male in no acute distress  Eyes:    PERRL, conjunctiva/corneas clear, EOM's intact       Lungs:     Clear to auscultation bilaterally, respirations unlabored  Heart:    Normal heart rate. Normal rhythm. No murmurs, rubs, or gallops.   MS:   All extremities are intact.   Neurologic:   Awake, alert, oriented x 3. No apparent focal neurological           defect.        Results for orders placed or performed in visit on 12/14/19  POCT HgB A1C  Result Value Ref Range   Hemoglobin A1C 7.7 (A) 4.0 - 5.6 %   Est. average glucose Bld gHb Est-mCnc 174   POCT Glucose (CBG)  Result Value Ref Range   POC Glucose 273 (A) 70 - 99 mg/dl       Assessment & Plan    1. Steroid-induced diabetes mellitus, initial encounter ([place]) Improving with reduction in steroid dose. He will stay on low dose for the rest of the month, then stop, and call if any change in breathing difficulty.   Follow up her in 6 weeks.   2. Hyperglycemia   3. Restrictive lung disease Continue inhalers and follow up pulmonary in April as scheduled.   4. On home oxygen therapy   The entirety of  the information documented in the History of Present Illness, Review of Systems and Physical Exam were personally obtained by me. Portions of this information were initially documented by Meyer Cory, CMA and reviewed by me for thoroughness and accuracy.      Lelon Huh, MD  [facility]

## 2019-12-21 ENCOUNTER — Ambulatory Visit: Payer: Self-pay | Admitting: Family Medicine

## 2019-12-30 DIAGNOSIS — J9620 Acute and chronic respiratory failure, unspecified whether with hypoxia or hypercapnia: Secondary | ICD-10-CM | POA: Diagnosis not present

## 2020-01-03 ENCOUNTER — Ambulatory Visit: Payer: Self-pay

## 2020-01-03 ENCOUNTER — Ambulatory Visit (INDEPENDENT_AMBULATORY_CARE_PROVIDER_SITE_OTHER): Payer: Medicare Other | Admitting: Adult Health

## 2020-01-03 ENCOUNTER — Ambulatory Visit (INDEPENDENT_AMBULATORY_CARE_PROVIDER_SITE_OTHER): Payer: Medicare Other

## 2020-01-03 ENCOUNTER — Encounter: Payer: Self-pay | Admitting: Adult Health

## 2020-01-03 ENCOUNTER — Ambulatory Visit
Admission: EM | Admit: 2020-01-03 | Discharge: 2020-01-03 | Disposition: A | Discharge status: Home / Self Care | Payer: Medicare Other | Attending: Family Medicine | Admitting: Family Medicine

## 2020-01-03 ENCOUNTER — Other Ambulatory Visit: Payer: Self-pay

## 2020-01-03 VITALS — Wt 193.0 lb

## 2020-01-03 DIAGNOSIS — R0602 Shortness of breath: Secondary | ICD-10-CM | POA: Insufficient documentation

## 2020-01-03 DIAGNOSIS — Z9981 Dependence on supplemental oxygen: Secondary | ICD-10-CM

## 2020-01-03 DIAGNOSIS — Z87891 Personal history of nicotine dependence: Secondary | ICD-10-CM

## 2020-01-03 DIAGNOSIS — Z8719 Personal history of other diseases of the digestive system: Secondary | ICD-10-CM | POA: Diagnosis not present

## 2020-01-03 DIAGNOSIS — U071 COVID-19: Secondary | ICD-10-CM | POA: Insufficient documentation

## 2020-01-03 DIAGNOSIS — I5020 Unspecified systolic (congestive) heart failure: Secondary | ICD-10-CM | POA: Diagnosis not present

## 2020-01-03 DIAGNOSIS — R079 Chest pain, unspecified: Secondary | ICD-10-CM | POA: Diagnosis not present

## 2020-01-03 DIAGNOSIS — Z8616 Personal history of COVID-19: Secondary | ICD-10-CM

## 2020-01-03 DIAGNOSIS — R0601 Orthopnea: Secondary | ICD-10-CM | POA: Diagnosis not present

## 2020-01-03 LAB — CBC WITH DIFFERENTIAL/PLATELET
Abs Immature Granulocytes: 0.04 10*3/uL (ref 0.00–0.07)
Basophils Absolute: 0 10*3/uL (ref 0.0–0.1)
Basophils Relative: 1 %
Eosinophils Absolute: 0.2 10*3/uL (ref 0.0–0.5)
Eosinophils Relative: 3 %
HCT: 43.5 % (ref 39.0–52.0)
Hemoglobin: 14.3 g/dL (ref 13.0–17.0)
Immature Granulocytes: 1 %
Lymphocytes Relative: 30 %
Lymphs Abs: 2.2 10*3/uL (ref 0.7–4.0)
MCH: 32.9 pg (ref 26.0–34.0)
MCHC: 32.9 g/dL (ref 30.0–36.0)
MCV: 100.2 fL — ABNORMAL HIGH (ref 80.0–100.0)
Monocytes Absolute: 0.6 10*3/uL (ref 0.1–1.0)
Monocytes Relative: 8 %
Neutro Abs: 4.3 10*3/uL (ref 1.7–7.7)
Neutrophils Relative %: 57 %
Platelets: 164 10*3/uL (ref 150–400)
RBC: 4.34 MIL/uL (ref 4.22–5.81)
RDW: 16.1 % — ABNORMAL HIGH (ref 11.5–15.5)
WBC: 7.4 10*3/uL (ref 4.0–10.5)
nRBC: 0 % (ref 0.0–0.2)

## 2020-01-03 LAB — COMPREHENSIVE METABOLIC PANEL
ALT: 23 U/L (ref 0–44)
AST: 23 U/L (ref 15–41)
Albumin: 3.9 g/dL (ref 3.5–5.0)
Alkaline Phosphatase: 104 U/L (ref 38–126)
Anion gap: 10 (ref 5–15)
BUN: 14 mg/dL (ref 8–23)
CO2: 30 mmol/L (ref 22–32)
Calcium: 8.6 mg/dL — ABNORMAL LOW (ref 8.9–10.3)
Chloride: 104 mmol/L (ref 98–111)
Creatinine, Ser: 1.3 mg/dL — ABNORMAL HIGH (ref 0.61–1.24)
GFR calc Af Amer: >60 mL/min (ref >60)
GFR calc non Af Amer: 53 mL/min — ABNORMAL LOW (ref >60)
Glucose, Bld: 150 mg/dL — ABNORMAL HIGH (ref 70–99)
Potassium: 3.7 mmol/L (ref 3.5–5.1)
Sodium: 144 mmol/L (ref 135–145)
Total Bilirubin: 1.4 mg/dL — ABNORMAL HIGH (ref 0.3–1.2)
Total Protein: 7.7 g/dL (ref 6.5–8.1)

## 2020-01-03 LAB — BRAIN NATRIURETIC PEPTIDE: B Natriuretic Peptide: 500 pg/mL — ABNORMAL HIGH (ref 0.0–100.0)

## 2020-01-03 MED ORDER — PREDNISONE 10 MG PO TABS: ORAL_TABLET | ORAL | 0 refills | Status: DC

## 2020-01-03 NOTE — Discharge Instructions (Addendum)
Prednisone as prescribed.  PCP will call for follow up.  Take care  Dr. Lacinda Axon

## 2020-01-03 NOTE — Progress Notes (Signed)
Patient: Taylor [name] Male    DOB: [birth year]   79 y.o.   MRN: [id number] Visit Date: 01/03/2020  Today's Provider: Marcille Buffy, FNP   Chief Complaint  Patient presents with  . Shortness of Breath   Subjective:    Virtual Visit via Video Note  I connected with [name] on 01/03/20 at  9:00 AM EST by a video enabled telemedicine application and verified that I am speaking with the correct person using two identifiers.  Location: Patient: at home  Provider: Provider: Provider's office at  [facility], [place].      I discussed the limitations of evaluation and management by telemedicine and the availability of in person appointments. The patient expressed understanding and agreed to proceed.    I discussed the assessment and treatment plan with the patient. The patient was provided an opportunity to ask questions and all were answered. The patient agreed with the plan and demonstrated an understanding of the instructions.   The patient was advised to call back or seek an in-person evaluation if the symptoms worsen or if the condition fails to improve as anticipated.    Shortness of Breath This is a new problem. The current episode started in the past 7 days. The problem occurs daily. The problem has been unchanged. Associated symptoms include wheezing. Pertinent negatives include no abdominal pain, chest pain, claudication, coryza, ear pain, fever, headaches, hemoptysis, leg pain, leg swelling, neck pain, orthopnea, PND, rash, rhinorrhea, sore throat, sputum production, swollen glands, syncope or vomiting. The symptoms are aggravated by lying flat. His past medical history is significant for pneumonia.   He reports he noticed last weekend he was having shortness of breath with [place] He thought it was [place], He return  home and was still having the same issues with the CPAP. He reports he was  better two nights ago and then last night his shortness of breath with lying down returned.  He denies any abdomina, upper or lower extremity edema.  He had oxygen prescribed after covid - he worse constant day and night. Oxygen has been between 95-100 %.  He has oxygen saturation this morning upon wakening of  94%, B/P is  elevated at 179/104.   Heart rate 77 and  is 93% he then rechecks and is 97 % He reports heart rate is usually 97-101. Fatigue this is not new.  These are without oxygen supplementation.  Orthopnea reported.   Denies any chest pain. He reports he has no abdominal pain.   Patient  denies any fever, body aches,chills, rash, chest pain,  nausea, vomiting, or diarrhea.   See below for patient's extensive hospitalization.  He was hospitalized for pancreatitis.  11/28/2019 Hospital Course:  79 y.o.malewith positive IgG for Covid admitted with abdominal pain and diagnosed with pancreatitis based on imaging. Patient recently was treated for pneumonia with a course of prednisone and Bactrim.He describes the pain as bilateral lower quadrant, dull, 5/10. Never had pain in his upper abdomen. No nausea or vomiting. Does not have any pain at the time of my evaluation but did have some yesterday.   Allergies  Allergen Reactions  . Amlodipine Besylate Swelling    Had a reaction when taking with colcrys   . Crestor [Rosuvastatin]     Muscle cramps and pain  . Rocephin [Ceftriaxone]     unknown     Current Outpatient Medications:  .  allopurinol (ZYLOPRIM) 300 MG tablet, Take 2 tablets (600 mg total) by mouth daily., Disp: 60 tablet, Rfl: 12 .  aspirin 81 MG EC tablet, Take 81 mg by mouth daily. , Disp: , Rfl:  .  atorvastatin (LIPITOR) 40 MG tablet, TAKE 1 TABLET BY MOUTH EVERY DAY, Disp: 90 tablet, Rfl: 4 .  budesonide-formoterol (SYMBICORT) 160-4.5 MCG/ACT inhaler, Inhale 2 puffs into the lungs 2 (two) times daily., Disp: 1 Inhaler, Rfl: 3 .  carvedilol (COREG) 12.5 MG  tablet, TAKE 1 TABLET BY MOUTH DAILY, Disp: 90 tablet, Rfl: 4 .  COMBIVENT RESPIMAT 20-100 MCG/ACT AERS respimat, SMARTSIG:2 Inhalation Via Inhaler 4 Times Daily PRN, Disp: , Rfl:  .  Cyanocobalamin (VITAMIN B-12 CR) 1000 MCG TBCR, Take 1,000 mcg by mouth daily. , Disp: , Rfl:  .  fluticasone (FLONASE) 50 MCG/ACT nasal spray, SHAKE LIQUID AND USE 2 SPRAYS IN EACH NOSTRIL DAILY, Disp: 48 g, Rfl: 4 .  glucose blood (ONETOUCH VERIO) test strip, Use as instructed to check sugar daily, Disp: 100 each, Rfl: 12 .  indomethacin (INDOCIN) 50 MG capsule, Take 1 capsule (50 mg total) by mouth 3 (three) times daily as needed (for gout flares)., Disp: 30 capsule, Rfl: 1 .  losartan (COZAAR) 50 MG tablet, TAKE 2 TABLETS(100 MG) BY MOUTH DAILY (Patient taking differently: Take 50 mg by mouth daily. ), Disp: 90 tablet, Rfl: 4 .  nystatin (MYCOSTATIN) 100000 UNIT/ML suspension, Take 5 mLs by mouth 4 (four) times daily., Disp: , Rfl:  .  pantoprazole (PROTONIX) 40 MG tablet, TAKE 1 TABLET BY MOUTH EVERY DAY, Disp: 90 tablet, Rfl: 4 .  polyethylene glycol (MIRALAX / GLYCOLAX) 17 g packet, Take 17 g by mouth daily as needed for mild constipation or moderate constipation., Disp: 14 each, Rfl: 0 .  torsemide (DEMADEX) 20 MG tablet, Take 1 tablet (20 mg total) by mouth daily for 7 days., Disp: 7 tablet, Rfl: 0  Review of Systems  Constitutional: Negative for fever.  HENT: Positive for congestion. Negative for ear pain, rhinorrhea and sore throat.   Respiratory: Positive for cough, shortness of breath and wheezing. Negative for hemoptysis and sputum production.   Cardiovascular: Negative for chest pain, orthopnea, claudication, leg swelling, syncope and PND.  Gastrointestinal: Negative for abdominal pain and vomiting.  Musculoskeletal: Negative for neck pain.  Skin: Negative for rash.  Neurological: Negative for headaches.    Social History   Tobacco Use  . Smoking status: Former Smoker    Packs/day: 1.00     Types: Cigarettes    Quit date: 11/26/1999    Years since quitting: 20.1  . Smokeless tobacco: Never Used  Substance Use Topics  . Alcohol use: No      Objective:   Wt 193 lb (87.5 kg)   BMI 27.69 kg/m  Vitals:   01/03/20 0858  Weight: 193 lb (87.5 kg)  Body mass index is 27.69 kg/m.   Physical Exam Neurological:     Mental Status: He is alert.  Psychiatric:        Attention and Perception: Attention and perception normal.        Mood and Affect: Mood and affect normal.        Speech: Speech normal.        Behavior: Behavior normal.        Thought Content: Thought content normal.        Cognition and Memory: Cognition normal.        Judgment: Judgment normal.  Patient is alert and oriented and responsive to questions Engages in conversation with provider. Speaks in full sentences without any pauses without any shortness of breath or distress.  He does appear fatigued on video. He is able to walk in his house and go get his pulse oximetry without difficulty. No visible dyspnea or sounds of  Wheezing, or stridor  heard through video.  No results found for any visits on 01/03/20.     Assessment & Plan     Shortness of breath  Personal history of covid-19  History of acute pancreatitis  Systolic CHF with reduced left ventricular function, NYHA class 3 ([place])  On home oxygen therapy  Discussed with the patient that we do have respiratory clinics, however would not like for patient to wait until this afternoon to be seen.  Given his extensive history and his multiple comorbidities I feel that he should be seen immediately, he does not want to go to the emergency room, and symptoms are not emergent, patient is not in acute distress on video, patient agrees that he will go to [facility] urgent care for an evaluation and that he will go to the emergency room if they sent him in that direction. I am concerned for possible post Covid pneumonia or other post Covid complications  reoccurring pancreatitis, worsening CHF given orthopnea.   Do not feel that video visit is appropriate, and patient should have an in person assessment immediately.  Patient agrees to this plan and he will proceed.  Advised patient call the office or your primary care doctor for an appointment if no improvement within 72 hours or if any symptoms change or worsen at any time  Advised ER or urgent Care if after hours or on weekend. Call 911 for emergency symptoms at any time.Patinet verbalized understanding of all instructions given/reviewed and treatment plan and has no further questions or concerns at this time.     The entirety of the information documented in the History of Present Illness, Review of Systems and Physical Exam were personally obtained by me. Portions of this information were initially documented by the  Certified Medical Assistant whose name is documented in [place] and reviewed by me for thoroughness and accuracy.  I have personally performed the exam and reviewed the chart and it is accurate to the best of my knowledge.  Haematologist has been used and any errors in dictation or transcription are unintentional.  Kelby Aline. Flinchum FNP-C  [facility]      I provided 20  minutes of non-face-to-face time during this encounter. Marcille Buffy, [facility]

## 2020-01-03 NOTE — Telephone Encounter (Signed)
Pt. Reports he has had a COVID 19 diagnosis 11/21/19 with hospitalization. Noticed last week he has shortness of breath at bedtime, laying down. Uses CPAP. O2 sats stay in 90's. BP has been running high. This morning 179/104. Still has congestion "from the [place] in my chest and sinuses." No availability with his PCP. Virtual visit for this morning. Reason for Disposition . [1] MODERATE longstanding difficulty breathing (e.g., speaks in phrases, SOB even at rest, pulse 100-120) AND [2] SAME as normal  Answer Assessment - Initial Assessment Questions 1. RESPIRATORY STATUS: "Describe your breathing?" (e.g., wheezing, shortness of breath, unable to speak, severe coughing)      Shortness of breath 2. ONSET: "When did this breathing problem begin?"      Last week 3. PATTERN "Does the difficult breathing come and go, or has it been constant since it started?"      Comes and goes 4. SEVERITY: "How bad is your breathing?" (e.g., mild, moderate, severe)    - MILD: No SOB at rest, mild SOB with walking, speaks normally in sentences, can lay down, no retractions, pulse < 100.    - MODERATE: SOB at rest, SOB with minimal exertion and prefers to sit, cannot lie down flat, speaks in phrases, mild retractions, audible wheezing, pulse 100-120.    - SEVERE: Very SOB at rest, speaks in single words, struggling to breathe, sitting hunched forward, retractions, pulse > 120      Moderate 5. RECURRENT SYMPTOM: "Have you had difficulty breathing before?" If so, ask: "When was the last time?" and "What happened that time?"      Yes 6. CARDIAC HISTORY: "Do you have any history of heart disease?" (e.g., heart attack, angina, bypass surgery, angioplasty)      Yes 7. LUNG HISTORY: "Do you have any history of lung disease?"  (e.g., pulmonary embolus, asthma, emphysema)     No 8. CAUSE: "What do you think is causing the breathing problem?"      Unsure 9. OTHER SYMPTOMS: "Do you have any other symptoms? (e.g., dizziness,  runny nose, cough, chest pain, fever)     Congestion - nasal 10. PREGNANCY: "Is there any chance you are pregnant?" "When was your last menstrual period?"       n/a 11. TRAVEL: "Have you traveled out of the country in the last month?" (e.g., travel history, exposures)       No  Protocols used: BREATHING DIFFICULTY-A-AH

## 2020-01-03 NOTE — ED Provider Notes (Signed)
[facility]    CSN: [id number] Arrival date & time: 01/03/20  1017      History   Chief Complaint Chief Complaint  Patient presents with  . Shortness of Breath  . Cough   HPI  79 year old male with an extensive past medical history presents with shortness of breath.  Recent hospitalizations in December reviewed in depth.   Hospitalized from 12/18-12/20 acute hypoxic respiratory failure.  Found to have multifocal pneumonia.  Was treated with Levaquin and was discharged home on steroids as well as Bactrim.  Patient had negative Covid testing on more than 1 occasion but his antibody test came back positive.  As a result, it was suspected that patient had COVID-19 pneumonia.  Patient admitted again from 12/28-1/4.  Patient presented with abdominal pain.  Was found to have pancreatitis.  Suspected drug-induced pancreatitis.  Was treated with supportive care and IV fluids.  Patient has follow-up with his primary care physician.  He has also seen pulmonology.  Patient presents today with a 1 week history of shortness of breath.  He states that he has shortness of breath with exertion and also has orthopnea.  He states that he is particular bothered by difficulty sleeping when he lies flat.  He states that he sleeping with one pillow.  He states that he feels better after he lays down for approximately 5 minutes but is quite short of breath while he is waiting for it to subside over those 5 minutes.  Denies fever.  Denies cough.  Has known congestive heart failure.  Last echocardiogram was reviewed and showed an EF of 20%.  Echocardiogram was in February 2020.  Past Medical History:  Diagnosis Date  . AAA (abdominal aortic aneurysm) [facility]) 06/03/2007   [facility]; Dr. Kellie Simmering  . AICD (automatic cardioverter/defibrillator) present   . Barrett's esophagus   . Bladder cancer ([place])   . Bradycardia   . CAD (coronary artery disease)   . CHF (congestive heart  failure) ([place])   . Cluster headache   . DDD (degenerative disc disease), lumbar   . Dyspnea    WITH EXERTION  . Edema    LEFT ANKLE  . Fracture of skull base ([place]) 1997   due to fall  . GERD (gastroesophageal reflux disease)   . Gout   . History of bladder cancer 12/1995  . Hyperlipidemia   . Hypertension   . Malignant melanoma ([place]) 12/2012   right dorsal forearm excised  . Myocardial infarction ([place])    LAST 2014  . Osteoarthritis of knee   . Pacemaker 10/10/2006  . Pneumonia    2016  . Pre-diabetes   . Psoriasis   . Rib fracture 1997   due to fall  . Sleep apnea    CPAP  . Stroke ([place])   . Venous incompetence     Patient Active Problem List   Diagnosis Date Noted  . Acute respiratory failure with hypoxia ([place]) 12/06/2019  . Other specified interstitial pulmonary diseases ([place]) 12/06/2019  . Abdominal pain   . Suspected COVID-19 virus infection 11/22/2019  . Pancreatitis 11/22/2019  . Leukocytosis 11/22/2019  . CAP (community acquired pneumonia) 11/13/2019  . Hyperuricemia 03/31/2019  . Numbness 05/07/2018  . Benign colonic polyp 03/24/2017  . Tibialis anterior tenosynovitis 12/21/2015  . Pleural nodule 07/27/2015  . Psoriatic arthritis ([place]) 05/12/2015  . Restrictive lung disease 05/12/2015  . Barrett esophagus 03/30/2015  . Ache in joint 03/30/2015  . Bradycardia 03/30/2015  . Arteriosclerosis  of coronary artery 03/30/2015  . Cardiac defibrillator in place 03/30/2015  . Chronic kidney disease (CKD), stage III (moderate) 03/30/2015  . Cluster headache syndrome 03/30/2015  . Degeneration of lumbar or lumbosacral intervertebral disc 03/30/2015  . Gout 03/30/2015  . Personal history of malignant neoplasm of bladder 03/30/2015  . Hypercholesteremia 03/30/2015  . LBP (low back pain) 03/30/2015  . Personal history of malignant melanoma of skin 03/30/2015  . Muscle ache 03/30/2015  . Arthritis of knee, degenerative 03/30/2015  . Prediabetes 03/30/2015  .  Psoriasis 03/30/2015  . Obstructive sleep apnea 03/30/2015  . Chronic venous insufficiency 03/30/2015  . History of abdominal aortic aneurysm (AAA) 06/24/2013  . History of CVA (cerebrovascular accident) 06/24/2013  . Cardiomyopathy, ischemic 06/24/2013  . History of AAA (abdominal aortic aneurysm) repair 06/24/2013  . Essential (primary) hypertension 08/17/2011  . Arrhythmia, sinus node 08/17/2011  . History of ventricular fibrillation 08/17/2011  . Systolic CHF with reduced left ventricular function, NYHA class 3 ([place]) 02/26/2010  . 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 Cardiac pacemaker in situ 10/10/2006    Past Surgical History:  Procedure Laterality Date  . ABDOMINAL AORTIC ANEURYSM REPAIR  06/03/2007   [facility]; Dr. Kellie Simmering  . ANGIOPLASTY  1994   MI  . BLADDER TUMOR EXCISION  12/1995  . CATARACT EXTRACTION W/PHACO Left 10/22/2016   Procedure: CATARACT EXTRACTION PHACO AND INTRAOCULAR LENS PLACEMENT (IOC);  Surgeon: Birder Robson, MD;  Location: [facility] ORS;  Service: Ophthalmology;  Laterality: Left;  [place] 47.7 AP% 18.4 CDE 8.78 Fluid pack lot # [id number]  . CATARACT EXTRACTION W/PHACO Right 12/10/2016   Procedure: CATARACT EXTRACTION PHACO AND INTRAOCULAR LENS PLACEMENT (IOC);  Surgeon: Birder Robson, MD;  Location: [facility] ORS;  Service: Ophthalmology;  Laterality: Right;  [place] 00:39 AP% 23.3 CDE 9.13 Fluid pack lot # [id number]  . CORONARY ANGIOPLASTY     STENTS X 5  . CORONARY ARTERY BYPASS GRAFT  09/22/2006   four  . ELBOW BURSA SURGERY     DUE TO GOUT  . INSERT / REPLACE / REMOVE PACEMAKER    . MELANOMA EXCISION  12/2012   Right forearm   Home Medications    Prior to Admission medications   Medication Sig Start Date End Date Taking? Authorizing Nilan Iddings  allopurinol (ZYLOPRIM) 300 MG tablet Take 2 tablets (600 mg total) by mouth daily. 01/27/19  Yes Birdie Sons, MD  aspirin 81 MG EC tablet Take 81 mg by mouth  daily.    Yes Taylie Helder, Historical, MD  atorvastatin (LIPITOR) 40 MG tablet TAKE 1 TABLET BY MOUTH EVERY DAY 12/22/18  Yes Birdie Sons, MD  budesonide-formoterol [facility]) 160-4.5 MCG/ACT inhaler Inhale 2 puffs into the lungs 2 (two) times daily. 12/06/19  Yes Birdie Sons, MD  carvedilol (COREG) 12.5 MG tablet TAKE 1 TABLET BY MOUTH DAILY 08/31/19  Yes Birdie Sons, MD  Cyanocobalamin (VITAMIN B-12 CR) 1000 MCG TBCR Take 1,000 mcg by mouth daily.    Yes Zayaan Kozak, Historical, MD  fluticasone (FLONASE) 50 MCG/ACT nasal spray SHAKE LIQUID AND USE 2 SPRAYS IN EACH NOSTRIL DAILY 12/12/19  Yes Birdie Sons, MD  glucose blood (ONETOUCH VERIO) test strip Use as instructed to check sugar daily 12/14/19  Yes [name], [name], MD  indomethacin (INDOCIN) 50 MG capsule Take 1 capsule (50 mg total) by mouth 3 (three) times daily as needed (for gout flares). 01/22/19  Yes Birdie Sons, MD  losartan (COZAAR) 50 MG tablet  TAKE 2 TABLETS(100 MG) BY MOUTH DAILY Patient taking differently: Take 50 mg by mouth daily.  07/08/19  Yes Birdie Sons, MD  nystatin (MYCOSTATIN) 100000 UNIT/ML suspension Take 5 mLs by mouth 4 (four) times daily. 12/09/19  Yes Alyssandra Hulsebus, Historical, MD  pantoprazole (PROTONIX) 40 MG tablet TAKE 1 TABLET BY MOUTH EVERY DAY 04/09/19  Yes Birdie Sons, MD  polyethylene glycol (MIRALAX / GLYCOLAX) 17 g packet Take 17 g by mouth daily as needed for mild constipation or moderate constipation. 11/28/19  Yes Thornell Mule, MD  COMBIVENT RESPIMAT 20-100 MCG/ACT AERS respimat SMARTSIG:2 Inhalation Via Inhaler 4 Times Daily PRN 12/09/19   Sary Bogie, Historical, MD  predniSONE (DELTASONE) 10 MG tablet 50 mg daily x 2 days, then 40 mg daily x 2 days, then 30 mg daily x 2 days, then 20 mg daily x 2 days, then 10 mg daily x 2 days. 01/03/20   Coral Spikes, DO  torsemide (DEMADEX) 20 MG tablet Take 1 tablet (20 mg total) by mouth daily for 7 days. 11/28/19 12/05/19  Thornell Mule, MD   Family  History Family History  Problem Relation Age of Onset  . Cancer Mother        Melanoma skin cancer  . Heart attack Father 95  . Cancer Father        throat cancer  . Arthritis Brother    Social History Social History   Tobacco Use  . Smoking status: Former Smoker    Packs/day: 1.00    Types: Cigarettes    Quit date: 11/26/1999    Years since quitting: 20.1  . Smokeless tobacco: Never Used  Substance Use Topics  . Alcohol use: No  . Drug use: No   Allergies   Amlodipine besylate, Crestor [rosuvastatin], and Rocephin [ceftriaxone]   Review of Systems Review of Systems  Constitutional: Negative for fever.  Respiratory: Positive for shortness of breath.        Orthopnea, DOE.   Physical Exam Triage Vital Signs ED Triage Vitals  Enc Vitals Group     BP 01/03/20 1049 (!) 141/96     Pulse Rate 01/03/20 1049 63     Resp --      Temp 01/03/20 1049 98.3 F (36.8 C)     Temp src --      SpO2 01/03/20 1049 98 %     Weight 01/03/20 1047 193 lb (87.5 kg)     Height 01/03/20 1047 5\' 10"  (1.778 m)     Head Circumference --      Peak Flow --      Pain Score 01/03/20 1047 0     Pain Loc --      Pain Edu? --      Excl. in [place]? --    Updated Vital Signs BP (!) 141/96 (BP Location: Left Arm)   Pulse 63   Temp 98.3 F (36.8 C)   Ht 5\' 10"  (1.778 m)   Wt 87.5 kg   SpO2 98%   BMI 27.69 kg/m   Visual Acuity Right Eye Distance:   Left Eye Distance:   Bilateral Distance:    Right Eye Near:   Left Eye Near:    Bilateral Near:     Physical Exam Vitals and nursing note reviewed.  Constitutional:      Appearance: Normal appearance. He is not ill-appearing.  HENT:     Head: Normocephalic and atraumatic.  Eyes:     General:  Right eye: No discharge.        Left eye: No discharge.     Conjunctiva/sclera: Conjunctivae normal.  Cardiovascular:     Rate and Rhythm: Normal rate and regular rhythm.     Heart sounds: No murmur.  Pulmonary:     Breath sounds: No  wheezing.     Comments: Mild increased work of breathing. No acute distress.  Abdominal:     General: There is no distension.     Palpations: Abdomen is soft.     Tenderness: There is no abdominal tenderness.  Skin:    Findings: Rash present.  Neurological:     Mental Status: He is alert.  Psychiatric:        Mood and Affect: Mood normal.        Behavior: Behavior normal.    UC Treatments / Results  Labs (all labs ordered are listed, but only abnormal results are displayed) Labs Reviewed  COMPREHENSIVE METABOLIC PANEL - Abnormal; Notable for the following components:      Result Value   Glucose, Bld 150 (*)    Creatinine, Ser 1.30 (*)    Calcium 8.6 (*)    Total Bilirubin 1.4 (*)    GFR calc non Af Amer 53 (*)    All other components within normal limits  CBC WITH DIFFERENTIAL/PLATELET - Abnormal; Notable for the following components:   MCV 100.2 (*)    RDW 16.1 (*)    All other components within normal limits  BRAIN NATRIURETIC PEPTIDE   Radiology DG Chest 2 View  Result Date: 01/03/2020 CLINICAL DATA:  Shortness of breath. EXAM: CHEST - 2 VIEW COMPARISON:  November 22, 2019. FINDINGS: Stable cardiomegaly. Status post coronary bypass graft. Left-sided pacemaker is unchanged in position. No pneumothorax or pleural effusion is noted. Stable bilateral lung opacities are noted, right greater than left, concerning for multifocal pneumonia. Bony thorax is unremarkable. IMPRESSION: Stable bilateral lung opacities, right greater than left, concerning for multifocal pneumonia. Electronically Signed   By: Marijo Conception M.D.   On: 01/03/2020 11:32    Procedures Procedures (including critical care time)  Medications Ordered in UC Medications - No data to display  Initial Impression / Assessment and Plan / UC Course  I have reviewed the triage vital signs and the nursing notes.  Pertinent labs & imaging results that were available during my care of the patient were reviewed by me  and considered in my medical decision making (see chart for details).    79 year old male presents with orthopnea, dyspnea on exertion.  It is suspected that patient had COVID-19 although it seems to be somewhat unclear.  He had positive antibody test but negative PCR testing.  He has been hospitalized twice in December/Early January.  His hospital records, laboratory studies, most recent echocardiogram, and most recent chest x-ray reviewed.  Laboratory studies and chest x-ray obtained today.  Chest x-ray reveals persistent pulmonary infiltrates concerning for multifocal pneumonia.  It is slightly improved from his prior.  I suspect that his symptoms are multifactorial in origin.  I have spoke with his primary care physician personally as well as his pulmonologist.  Primary care physician has recommended that I placed him on corticosteroids.  Prednisone sent.  Pulmonology has recommended that he have follow-up this week.  We will arrange with the pulmonology office.  He is currently stable and not hypoxic.  Final Clinical Impressions(s) / UC Diagnoses   Final diagnoses:  Orthopnea  SOB (shortness of breath) on exertion  COVID-19     Discharge Instructions     Prednisone as prescribed.  PCP will call for follow up.  Take care  Dr. Lacinda Axon    ED Prescriptions    Medication Sig Dispense Auth. Cashel Bellina   predniSONE (DELTASONE) 10 MG tablet 50 mg daily x 2 days, then 40 mg daily x 2 days, then 30 mg daily x 2 days, then 20 mg daily x 2 days, then 10 mg daily x 2 days. 30 tablet Coral Spikes, DO     PDMP not reviewed this encounter.   Coral Spikes, [place] 01/03/20 1327

## 2020-01-03 NOTE — ED Triage Notes (Signed)
Pt presents with c/o shob and cough for about one week. Pt is wheezing. Pt reports recent dx of pna and hosp admission, d/c 11/28/19. Pt states he was advised he had "fluid" on his lungs at that time. Pt does not feel he is improving. Pt was tested for COVID and was neg.

## 2020-01-04 ENCOUNTER — Other Ambulatory Visit: Payer: Self-pay | Admitting: Family Medicine

## 2020-01-04 ENCOUNTER — Telehealth: Payer: Self-pay | Admitting: Family Medicine

## 2020-01-04 DIAGNOSIS — G4730 Sleep apnea, unspecified: Secondary | ICD-10-CM | POA: Diagnosis not present

## 2020-01-04 DIAGNOSIS — M1000 Idiopathic gout, unspecified site: Secondary | ICD-10-CM

## 2020-01-04 DIAGNOSIS — J208 Acute bronchitis due to other specified organisms: Secondary | ICD-10-CM | POA: Diagnosis not present

## 2020-01-04 NOTE — Telephone Encounter (Signed)
In person. He has already had covid with chronic lung disease post covid.

## 2020-01-04 NOTE — Telephone Encounter (Signed)
Should his appointment be a virtual, or in person visit.

## 2020-01-04 NOTE — Telephone Encounter (Signed)
Patient scheduled for 2//10/2020 at 10:40 per Dr. [name] Section.

## 2020-01-04 NOTE — Telephone Encounter (Signed)
Patient was seen at urgent care yesterday for shortness of breath and started back on steroid.   Please check with patient and have him schedule a follow up visit here before the end of the week. He can have a same day appointment.

## 2020-01-04 NOTE — Telephone Encounter (Signed)
Requested Prescriptions  Pending Prescriptions Disp Refills  . allopurinol (ZYLOPRIM) 300 MG tablet [Pharmacy Med Name: ALLOPURINOL 300MG  TABLETS] 60 tablet     Sig: TAKE 2 TABLETS(600 MG) BY MOUTH DAILY     Endocrinology:  Gout Agents Failed - 01/04/2020  9:18 AM      Failed - Cr in normal range and within 360 days    Creatinine, Ser  Date Value Ref Range Status  01/03/2020 1.30 (H) 0.61 - 1.24 mg/dL Final         Passed - Uric Acid in normal range and within 360 days    Uric Acid  Date Value Ref Range Status  05/12/2019 3.9 3.7 - 8.6 mg/dL Final    Comment:               Therapeutic target for gout patients: <6.0         Passed - Valid encounter within last 12 months    Recent Outpatient Visits          Yesterday Shortness of breath   [facility] Flinchum, Kelby Aline, FNP   3 weeks ago Steroid-induced diabetes mellitus, initial encounter [facility])   [facility] Birdie Sons, MD   4 weeks ago Other acute pancreatitis, unspecified complication status   [facility] Birdie Sons, MD   1 month ago Community acquired pneumonia, unspecified laterality   [facility] Birdie Sons, MD   1 month ago Subacute frontal sinusitis   [facility] Birdie Sons, MD      Future Appointments            In 4 weeks [name], [name], MD [facility] [place]   In 3 months  [facility]

## 2020-01-07 ENCOUNTER — Other Ambulatory Visit: Payer: Self-pay

## 2020-01-07 ENCOUNTER — Ambulatory Visit (INDEPENDENT_AMBULATORY_CARE_PROVIDER_SITE_OTHER): Payer: Medicare Other | Admitting: Family Medicine

## 2020-01-07 ENCOUNTER — Encounter: Payer: Self-pay | Admitting: Family Medicine

## 2020-01-07 VITALS — BP 111/67 | HR 61 | Temp 97.5°F | Resp 22 | Wt 213.0 lb

## 2020-01-07 DIAGNOSIS — J208 Acute bronchitis due to other specified organisms: Secondary | ICD-10-CM

## 2020-01-07 DIAGNOSIS — U071 COVID-19: Secondary | ICD-10-CM

## 2020-01-07 MED ORDER — TRIAMCINOLONE ACETONIDE 0.1 % EX OINT: 1.0000 "application " | TOPICAL_OINTMENT | Freq: Two times a day (BID) | CUTANEOUS | 3 refills | Status: DC | PRN

## 2020-01-07 NOTE — Progress Notes (Signed)
Patient: Taylor [name] Male    DOB: [birth year]   79 y.o.   MRN: [id number] Visit Date: 01/07/2020  Today's Provider: Lelon Huh, MD   Chief Complaint  Patient presents with  . Bronchitis   Subjective:     HPI  Follow up Urgent Care visit/ Acute bronchitis due to COVID 19 virus:   Patient was seen at an urgent care for cough and shortness of breath on 01/03/2020. He was treated for orthopnea, shortness of breath and COVID 19.  Treatment for this included prescribing prednisone an Septra. Since being seen at the Urgent care, patient was evaluated by pulmonology Dr. Lanney Gins on 01/04/2020 for Acute bronchitis due to   [place]. Pulmonology recommended that patient use icentive Spirometer and combivent inhaler . Will repeat CXR in 2 months to confirm resolution and eval for residual fibrosis.  He reports good compliance with treatment. He reports this condition is Unchanged.  ------------------------------------------------------------------------------------   Allergies  Allergen Reactions  . Amlodipine Besylate Swelling    Had a reaction when taking with colcrys   . Crestor [Rosuvastatin]     Muscle cramps and pain  . Rocephin [Ceftriaxone]     unknown     Current Outpatient Medications:  .  allopurinol (ZYLOPRIM) 300 MG tablet, TAKE 2 TABLETS(600 MG) BY MOUTH DAILY, Disp: 60 tablet, Rfl: 0 .  aspirin 81 MG EC tablet, Take 81 mg by mouth daily. , Disp: , Rfl:  .  atorvastatin (LIPITOR) 40 MG tablet, TAKE 1 TABLET BY MOUTH EVERY DAY, Disp: 90 tablet, Rfl: 1 .  budesonide-formoterol (SYMBICORT) 160-4.5 MCG/ACT inhaler, Inhale 2 puffs into the lungs 2 (two) times daily., Disp: 1 Inhaler, Rfl: 3 .  carvedilol (COREG) 12.5 MG tablet, TAKE 1 TABLET BY MOUTH DAILY, Disp: 90 tablet, Rfl: 4 .  COMBIVENT RESPIMAT 20-100 MCG/ACT AERS respimat, SMARTSIG:2 Inhalation Via Inhaler 4 Times Daily PRN, Disp: , Rfl:  .  Cyanocobalamin (VITAMIN B-12 CR) 1000 MCG TBCR, Take 1,000 mcg  by mouth daily. , Disp: , Rfl:  .  fluticasone (FLONASE) 50 MCG/ACT nasal spray, SHAKE LIQUID AND USE 2 SPRAYS IN EACH NOSTRIL DAILY, Disp: 48 g, Rfl: 4 .  glucose blood (ONETOUCH VERIO) test strip, Use as instructed to check sugar daily, Disp: 100 each, Rfl: 12 .  indomethacin (INDOCIN) 50 MG capsule, Take 1 capsule (50 mg total) by mouth 3 (three) times daily as needed (for gout flares)., Disp: 30 capsule, Rfl: 1 .  losartan (COZAAR) 50 MG tablet, TAKE 2 TABLETS(100 MG) BY MOUTH DAILY (Patient taking differently: Take 50 mg by mouth daily. ), Disp: 90 tablet, Rfl: 4 .  metolazone (ZAROXOLYN) 5 MG tablet, Take 1 tablet by mouth daily., Disp: , Rfl:  .  pantoprazole (PROTONIX) 40 MG tablet, TAKE 1 TABLET BY MOUTH EVERY DAY, Disp: 90 tablet, Rfl: 4 .  predniSONE (DELTASONE) 10 MG tablet, 50 mg daily x 2 days, then 40 mg daily x 2 days, then 30 mg daily x 2 days, then 20 mg daily x 2 days, then 10 mg daily x 2 days., Disp: 30 tablet, Rfl: 0 .  sulfamethoxazole-trimethoprim (BACTRIM) 400-80 MG tablet, Take by mouth. Take 1 tablet (80 mg of trimethoprim total) by mouth every 12 (twelve) hours for 7 days, Disp: , Rfl:  .  torsemide (DEMADEX) 20 MG tablet, Take 1 tablet (20 mg total) by mouth daily for 7 days., Disp: 7 tablet, Rfl: 0 .  nystatin (MYCOSTATIN) 100000 UNIT/ML suspension, Take 5  mLs by mouth 4 (four) times daily., Disp: , Rfl:  .  polyethylene glycol (MIRALAX / GLYCOLAX) 17 g packet, Take 17 g by mouth daily as needed for mild constipation or moderate constipation. (Patient not taking: Reported on 01/07/2020), Disp: 14 each, Rfl: 0  Review of Systems  Constitutional: Negative for appetite change, chills and fever.  HENT: Positive for voice change.   Respiratory: Positive for cough (dry) and shortness of breath. Negative for chest tightness and wheezing.   Cardiovascular: Negative for chest pain and palpitations.  Gastrointestinal: Negative for abdominal pain, nausea and vomiting.    Social  History   Tobacco Use  . Smoking status: Former Smoker    Packs/day: 1.00    Types: Cigarettes    Quit date: 11/26/1999    Years since quitting: 20.1  . Smokeless tobacco: Never Used  Substance Use Topics  . Alcohol use: No      Objective:   BP 111/67 (BP Location: Left Arm, Patient Position: Sitting, Cuff Size: Normal)   Pulse 61   Temp (!) 97.5 F (36.4 C) (Temporal)   Resp (!) 22   Wt 213 lb (96.6 kg)   SpO2 94% Comment: room air  BMI 30.56 kg/m  Vitals:   01/07/20 1037 01/07/20 1042  BP: 108/60 111/67  Pulse: 61   Resp: (!) 22   Temp: (!) 97.5 F (36.4 C)   TempSrc: Temporal   SpO2: 94%   Weight: 213 lb (96.6 kg)   Body mass index is 30.56 kg/m.   Physical Exam   General: Appearance:    Obese male in no acute distress  Eyes:    PERRL, conjunctiva/corneas clear, EOM's intact       Lungs:     Clear to auscultation bilaterally, respirations unlabored  Heart:    Normal heart rate. Normal rhythm. No murmurs, rubs, or gallops.   MS:   All extremities are intact.   Neurologic:   Awake, alert, oriented x 3. No apparent focal neurological           defect.        No results found for any visits on 01/07/20.     Assessment & Plan    1. Acute bronchitis due to COVID-19 virus Stable since UC visit and follow up with pulmonary earlier this weeks.   He also requested refill for triamcinolone ointement for psoriasis today.   Other orders - metolazone (ZAROXOLYN) 5 MG tablet; Take 1 tablet by mouth daily. - sulfamethoxazole-trimethoprim (BACTRIM) 400-80 MG tablet; Take by mouth. Take 1 tablet (80 mg of trimethoprim total) by mouth every 12 (twelve) hours for 7 days     Lelon Huh, MD  [facility]

## 2020-01-16 IMAGING — US US EXTREM LOW VENOUS*L*
1 series · 13 of 24 positions shown · non-contrast
Comparison: Left lower extremity venous Doppler dated 02/07/2010.

CLINICAL DATA: 76 y/o  M; left leg swelling.



[Series 1: us extrem low venous*left* · 0.09mm/px · 13 of 32 slices shown]
[im 1/32]
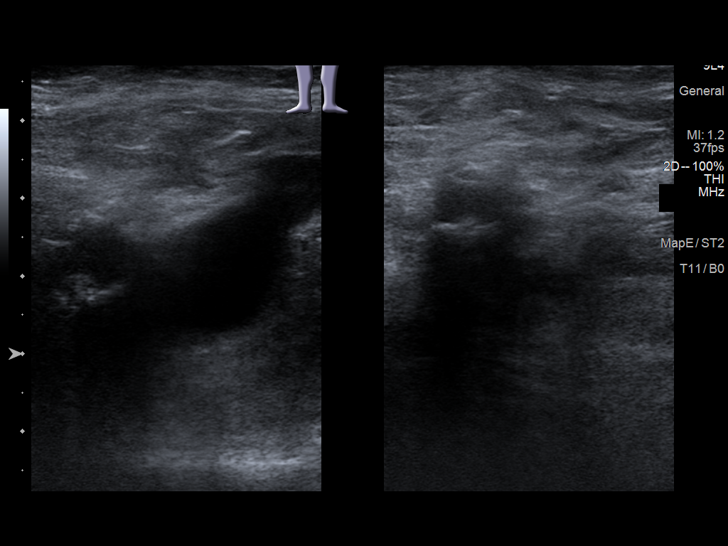
[im 3/32]
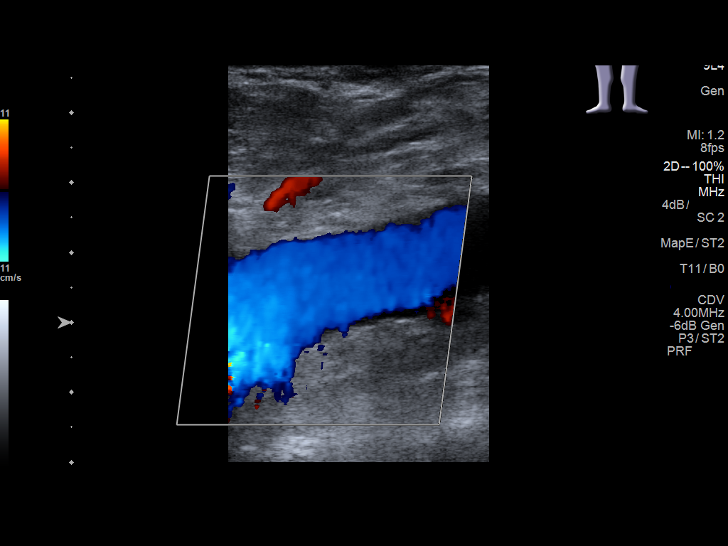
[im 6/32]
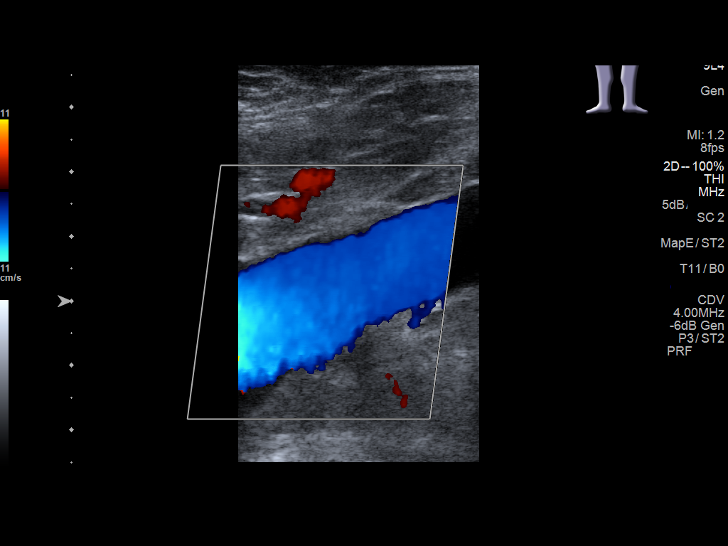
[im 9/32]
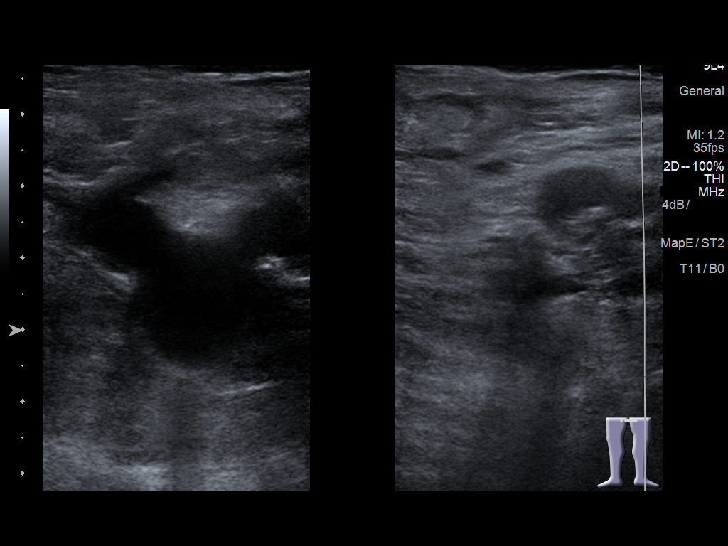
[im 11/32]
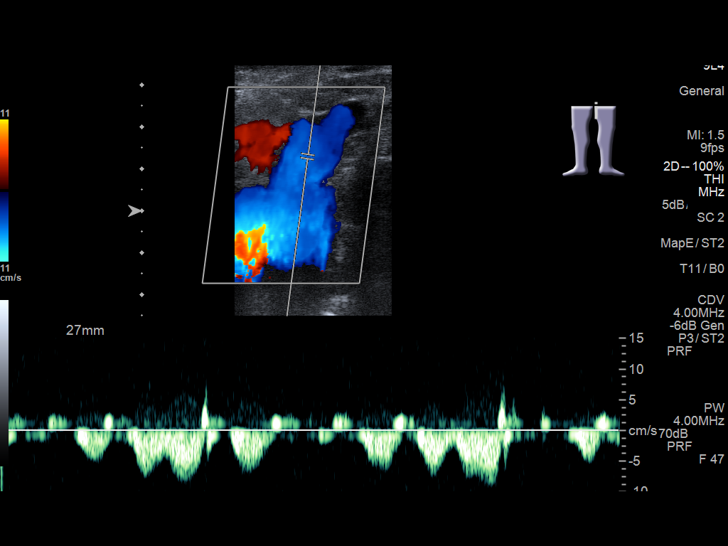
[im 14/32]
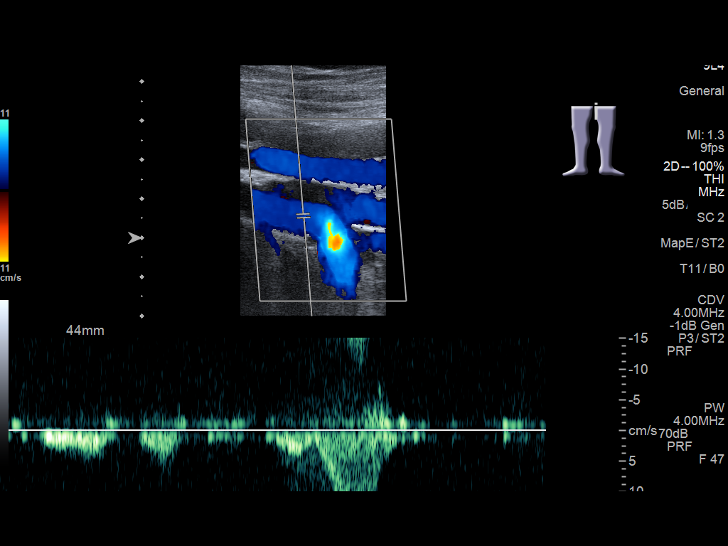
[im 17/32]
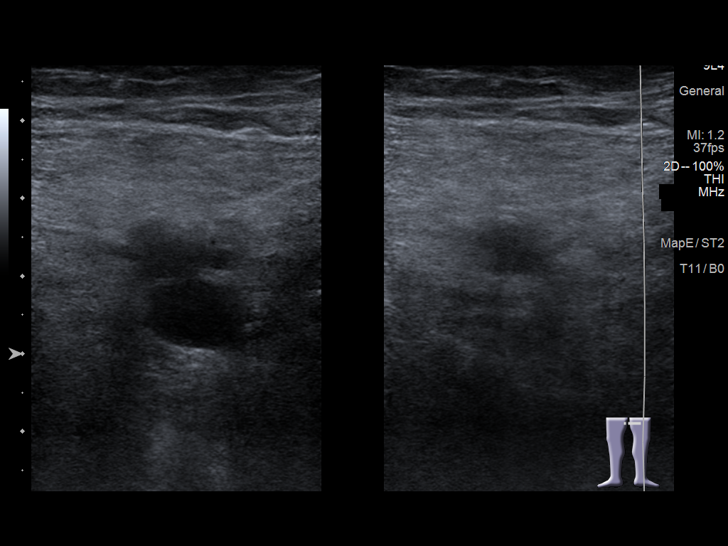
[im 18/32]
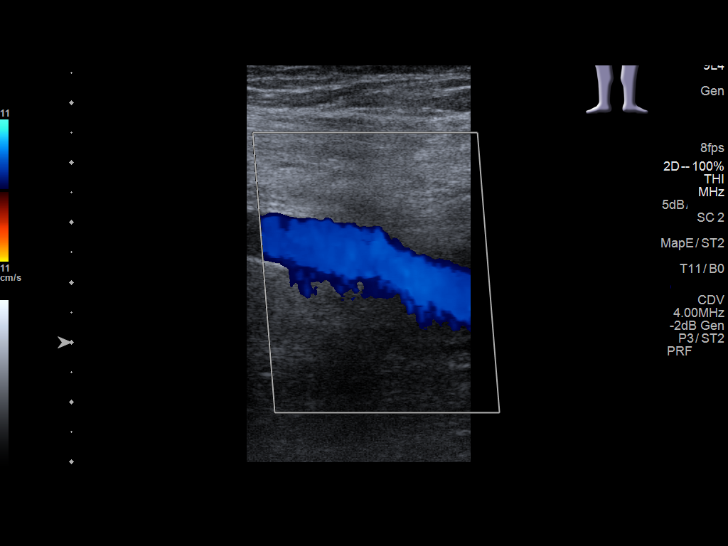
[im 21/32]
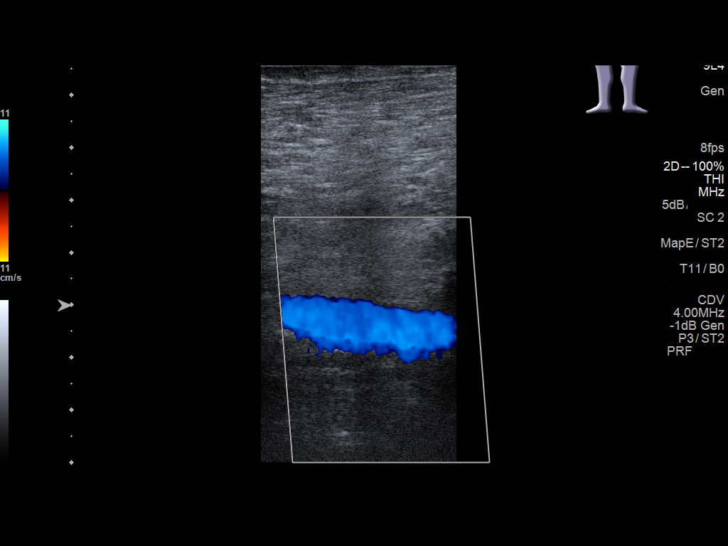
[im 23/32]
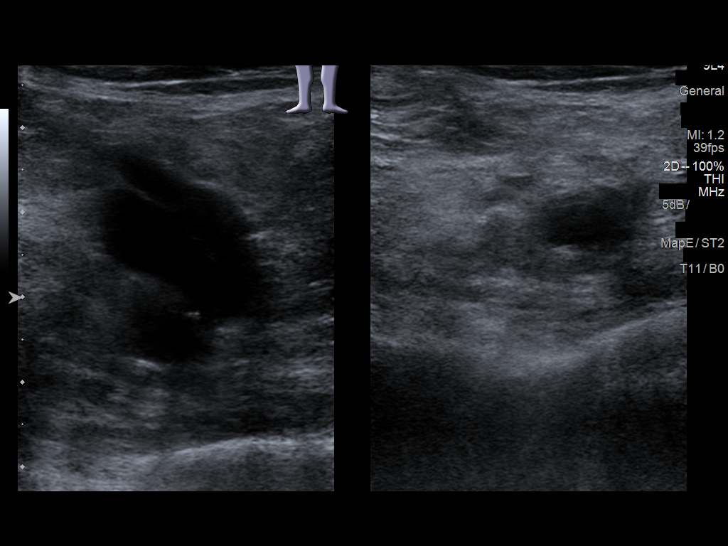
[im 26/32]
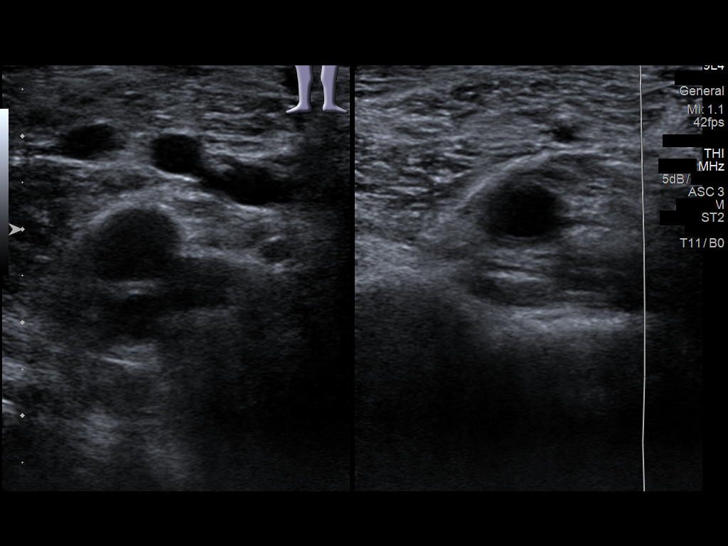
[im 29/32]
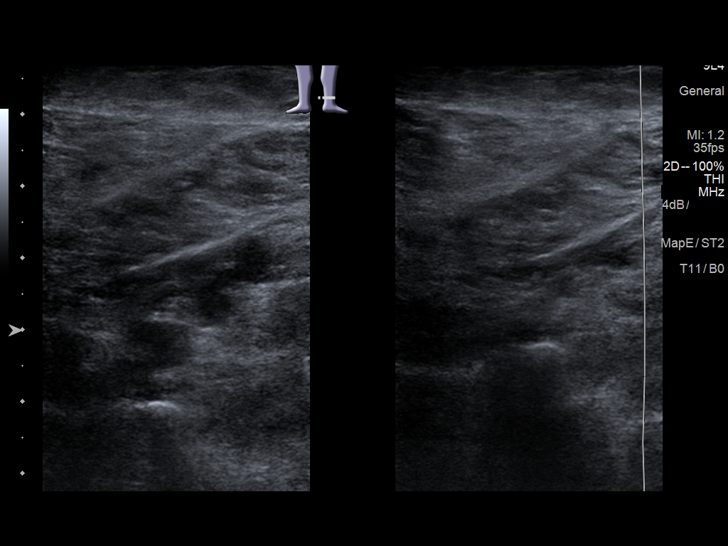
[im 32/32]
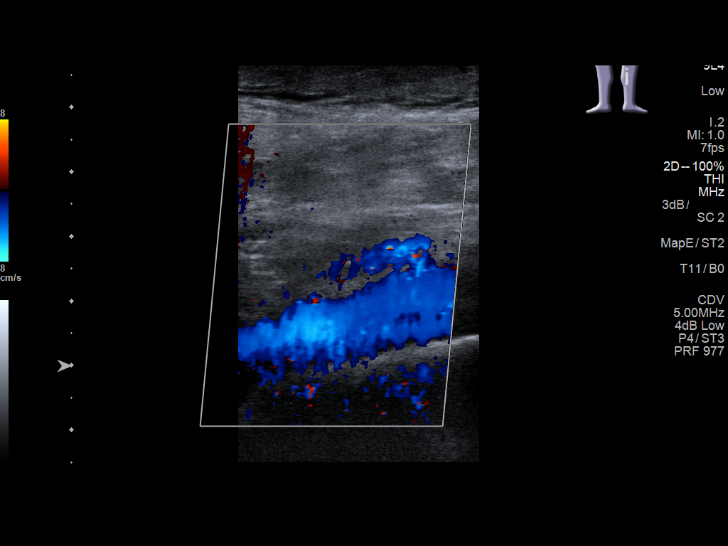

[13 of 24 positions shown; findings below may reference images not displayed]

FINDINGS: Contralateral Common Femoral Vein: Respiratory phasicity is normal
and symmetric with the symptomatic side. No evidence of thrombus.
Normal compressibility.

Common Femoral Vein: No evidence of thrombus. Normal
compressibility, respiratory phasicity and response to augmentation.

Saphenofemoral Junction: No evidence of thrombus. Normal
compressibility and flow on color Doppler imaging.

Profunda Femoral Vein: No evidence of thrombus. Normal
compressibility and flow on color Doppler imaging.

Femoral Vein: No evidence of thrombus. Normal compressibility,
respiratory phasicity and response to augmentation.

Popliteal Vein: No evidence of thrombus. Normal compressibility,
respiratory phasicity and response to augmentation.

Calf Veins: No evidence of thrombus. Normal compressibility and flow
on color Doppler imaging.

Superficial Great Saphenous Vein: No evidence of thrombus. Normal
compressibility.

Venous Reflux:  None.

Other Findings:  None.
IMPRESSION: No evidence of deep venous thrombosis.

By: Pelon Lyu M.D.

## 2020-01-18 DIAGNOSIS — J80 Acute respiratory distress syndrome: Secondary | ICD-10-CM | POA: Diagnosis not present

## 2020-01-21 ENCOUNTER — Ambulatory Visit: Payer: Medicare Other

## 2020-01-27 DIAGNOSIS — J9620 Acute and chronic respiratory failure, unspecified whether with hypoxia or hypercapnia: Secondary | ICD-10-CM | POA: Diagnosis not present

## 2020-02-01 ENCOUNTER — Ambulatory Visit: Payer: Self-pay | Admitting: Family Medicine

## 2020-02-01 DIAGNOSIS — G4730 Sleep apnea, unspecified: Secondary | ICD-10-CM | POA: Diagnosis not present

## 2020-02-08 DIAGNOSIS — I495 Sick sinus syndrome: Secondary | ICD-10-CM | POA: Diagnosis not present

## 2020-02-08 NOTE — Progress Notes (Signed)
Patient: Taylor [name]   DOB: [birth year]   79 y.o. Male  MRN: [id number] Visit Date: 02/09/2020  Today's Provider: Lelon Huh, MD  Subjective:    Chief Complaint  Patient presents with  . Bronchitis   HPI  Follow up for Acute bronchitis due to COVID-19 virus  The patient was last seen for this 1 months ago. Changes made at last visit include patient advised to check blood sugars. Elevated since patient was taking Prednisone. Patient states FBS is elevated averaging 190's. Lowest 118 and highest 280.  He reports good compliance with treatment. He feels that condition is slightly improved., still congested. Marland Kitchen He is not having side effects. Last visit with Dr. Alanson Puls was in mid April and has follow up in mid May. Has been off of prednisone for a few weeks. Blood sugars have some down are now mostly in the mid 100s. \  He also states that over the last week he has had a tightness in the back of his neck on the right side that is keeping him up at night. Doesn't recall any specific injury.   ------------------------------------------------------------------------------------      Medications: Medication Sig  . allopurinol (ZYLOPRIM) 300 MG tablet TAKE 2 TABLETS(600 MG) BY MOUTH DAILY  . aspirin 81 MG EC tablet Take 81 mg by mouth daily.   Marland Kitchen atorvastatin (LIPITOR) 40 MG tablet TAKE 1 TABLET BY MOUTH EVERY DAY  . budesonide-formoterol (SYMBICORT) 160-4.5 MCG/ACT inhaler Inhale 2 puffs into the lungs 2 (two) times daily.  . carvedilol (COREG) 12.5 MG tablet TAKE 1 TABLET BY MOUTH DAILY  . COMBIVENT RESPIMAT 20-100 MCG/ACT AERS respimat SMARTSIG:2 Inhalation Via Inhaler 4 Times Daily PRN  . Cyanocobalamin (VITAMIN B-12 CR) 1000 MCG TBCR Take 1,000 mcg by mouth daily.   . fluticasone (FLONASE) 50 MCG/ACT nasal spray SHAKE LIQUID AND USE 2 SPRAYS IN EACH NOSTRIL DAILY  . glucose blood (ONETOUCH VERIO) test strip Use as instructed to check sugar daily  . indomethacin  (INDOCIN) 50 MG capsule Take 1 capsule (50 mg total) by mouth 3 (three) times daily as needed (for gout flares).  . losartan (COZAAR) 50 MG tablet TAKE 2 TABLETS(100 MG) BY MOUTH DAILY (Patient taking differently: Take 50 mg by mouth daily. )  . metolazone (ZAROXOLYN) 5 MG tablet Take 1 tablet by mouth daily.  Marland Kitchen nystatin (MYCOSTATIN) 100000 UNIT/ML suspension Take 5 mLs by mouth 4 (four) times daily.  . pantoprazole (PROTONIX) 40 MG tablet TAKE 1 TABLET BY MOUTH EVERY DAY  . polyethylene glycol (MIRALAX / GLYCOLAX) 17 g packet Take 17 g by mouth daily as needed for mild constipation or moderate constipation.  . triamcinolone ointment (KENALOG) 0.1 % Apply 1 application topically 2 (two) times daily as needed (itching).  . torsemide (DEMADEX) 20 MG tablet Take 1 tablet (20 mg total) by mouth daily for 7 days.  . predniSONE (DELTASONE) 10 MG tablet 50 mg daily x 2 days, then 40 mg daily x 2 days, then 30 mg daily x 2 days, then 20 mg daily x 2 days, then 10 mg daily x 2 days.   No facility-administered medications prior to visit.      Review of Systems  Constitutional: Negative for appetite change, chills and fever.  HENT: Positive for congestion.   Respiratory: Negative for chest tightness, shortness of breath and wheezing.   Cardiovascular: Negative for chest pain and palpitations.  Gastrointestinal: Negative for abdominal pain, nausea and vomiting.  Objective:    BP (!) 144/78 (BP Location: Right Arm, Patient Position: Sitting, Cuff Size: Large)   Pulse 69   Temp (!) 96.6 F (35.9 C) (Temporal)   Wt 215 lb (97.5 kg)   BMI 30.85 kg/m    Physical Exam    General: Appearance:    Obese male in no acute distress  Eyes:    PERRL, conjunctiva/corneas clear, EOM's intact       Lungs:     Clear to auscultation bilaterally, respirations unlabored  Heart:    Normal heart rate. Normal rhythm. No murmurs, rubs, or gallops.   MS:   All extremities are intact. Tender spasm noted left  posterior aspect of neck.   Neurologic:   Awake, alert, oriented x 3. No apparent focal neurological           defect.        No results found for any visits on 02/09/20.    Assessment & Plan:    1. Prediabetes Sugars doing considerable better since getting off of prednisone.  Lab Results  Component Value Date   HGBA1C 7.7 (A) 12/14/2019  will recheck a1c in about 4 months.   2. Neuropathy He also states the bottoms of his feet have been burning and stinging  - Vitamin B12 - Comprehensive metabolic panel  3. Cough Persistent, post covid, although may also be related to CHF as his last BNP was significantly elevated.  - DG Chest 2 View; Future  4. Dyspnea on exertion He is now on both torsemide and metolazone.  - Comprehensive metabolic panel - CBC - Brain natriuretic peptide - DG Chest 2 View; Future  5. Strain of neck muscle, initial encounter  - tiZANidine (ZANAFLEX) 4 MG tablet; Take 1 tablet (4 mg total) by mouth at bedtime for 20 days. For neck pain  Dispense: 20 tablet; Refill: 0     Lelon Huh, MD  [facility] [phone] (phone) [phone] (fax)  [place]

## 2020-02-09 ENCOUNTER — Ambulatory Visit
Admission: RE | Admit: 2020-02-09 | Discharge: 2020-02-09 | Disposition: A | Discharge status: Home / Self Care | Payer: Medicare Other | Source: Ambulatory Visit | Attending: Family Medicine | Admitting: Family Medicine

## 2020-02-09 ENCOUNTER — Encounter: Payer: Self-pay | Admitting: Family Medicine

## 2020-02-09 ENCOUNTER — Other Ambulatory Visit: Payer: Self-pay

## 2020-02-09 ENCOUNTER — Ambulatory Visit (INDEPENDENT_AMBULATORY_CARE_PROVIDER_SITE_OTHER): Payer: Medicare Other | Admitting: Family Medicine

## 2020-02-09 VITALS — BP 144/78 | HR 69 | Temp 96.6°F | Wt 215.0 lb

## 2020-02-09 DIAGNOSIS — R0600 Dyspnea, unspecified: Secondary | ICD-10-CM | POA: Insufficient documentation

## 2020-02-09 DIAGNOSIS — S161XXA Strain of muscle, fascia and tendon at neck level, initial encounter: Secondary | ICD-10-CM | POA: Diagnosis not present

## 2020-02-09 DIAGNOSIS — R059 Cough, unspecified: Secondary | ICD-10-CM

## 2020-02-09 DIAGNOSIS — D539 Nutritional anemia, unspecified: Secondary | ICD-10-CM | POA: Diagnosis not present

## 2020-02-09 DIAGNOSIS — R05 Cough: Secondary | ICD-10-CM | POA: Diagnosis not present

## 2020-02-09 DIAGNOSIS — R0609 Other forms of dyspnea: Secondary | ICD-10-CM

## 2020-02-09 DIAGNOSIS — R7303 Prediabetes: Secondary | ICD-10-CM

## 2020-02-09 DIAGNOSIS — G629 Polyneuropathy, unspecified: Secondary | ICD-10-CM | POA: Diagnosis not present

## 2020-02-09 MED ORDER — TIZANIDINE HCL 4 MG PO TABS: 4.0000 mg | ORAL_TABLET | Freq: Every day | ORAL | 0 refills | Status: AC

## 2020-02-09 NOTE — Patient Instructions (Signed)
.   Please review the attached list of medications and notify my office if there are any errors.   . Please bring all of your medications to every appointment so we can make sure that our medication list is the same as yours.   Please go to the lab draw station in [address] on the second floor of [facility] .    Marland Kitchen Go to the [facility] on [facility] for chest Xray

## 2020-02-10 LAB — COMPREHENSIVE METABOLIC PANEL
ALT: 16 IU/L (ref 0–44)
AST: 27 IU/L (ref 0–40)
Albumin/Globulin Ratio: 1.4 (ref 1.2–2.2)
Albumin: 4.2 g/dL (ref 3.7–4.7)
Alkaline Phosphatase: 134 IU/L — ABNORMAL HIGH (ref 39–117)
BUN/Creatinine Ratio: 22 (ref 10–24)
BUN: 32 mg/dL — ABNORMAL HIGH (ref 8–27)
Bilirubin Total: 0.5 mg/dL (ref 0.0–1.2)
CO2: 25 mmol/L (ref 20–29)
Calcium: 8.1 mg/dL — ABNORMAL LOW (ref 8.6–10.2)
Chloride: 96 mmol/L (ref 96–106)
Creatinine, Ser: 1.45 mg/dL — ABNORMAL HIGH (ref 0.76–1.27)
GFR calc Af Amer: 53 mL/min/{1.73_m2} — ABNORMAL LOW (ref >59)
GFR calc non Af Amer: 46 mL/min/{1.73_m2} — ABNORMAL LOW (ref >59)
Globulin, Total: 2.9 g/dL (ref 1.5–4.5)
Glucose: 177 mg/dL — ABNORMAL HIGH (ref 65–99)
Potassium: 3.6 mmol/L (ref 3.5–5.2)
Sodium: 141 mmol/L (ref 134–144)
Total Protein: 7.1 g/dL (ref 6.0–8.5)

## 2020-02-10 LAB — VITAMIN B12: Vitamin B-12: 1141 pg/mL (ref 232–1245)

## 2020-02-10 LAB — CBC
Hematocrit: 42.6 % (ref 37.5–51.0)
Hemoglobin: 14.9 g/dL (ref 13.0–17.7)
MCH: 33.5 pg — ABNORMAL HIGH (ref 26.6–33.0)
MCHC: 35 g/dL (ref 31.5–35.7)
MCV: 96 fL (ref 79–97)
Platelets: 294 10*3/uL (ref 150–450)
RBC: 4.45 x10E6/uL (ref 4.14–5.80)
RDW: 14 % (ref 11.6–15.4)
WBC: 9.5 10*3/uL (ref 3.4–10.8)

## 2020-02-10 LAB — BRAIN NATRIURETIC PEPTIDE: BNP: 96.6 pg/mL (ref 0.0–100.0)

## 2020-02-11 ENCOUNTER — Telehealth: Payer: Self-pay

## 2020-02-11 NOTE — Telephone Encounter (Signed)
-----   Message from Birdie Sons, MD sent at 02/11/2020 10:53 AM EDT ----- Kidney functions are a little worse, need to drink water. Everything is normal, including cardiac functions, and xray is slowly clearing Continue current medications.  Follow up with pulmonary as scheduled.

## 2020-02-11 NOTE — Telephone Encounter (Signed)
Patient advised.

## 2020-02-24 ENCOUNTER — Ambulatory Visit: Payer: Medicare Other | Attending: Internal Medicine

## 2020-02-24 DIAGNOSIS — Z23 Encounter for immunization: Secondary | ICD-10-CM

## 2020-02-24 NOTE — Progress Notes (Signed)
   Covid-19 Vaccination Clinic  Name:  Taylor [name]    MRN: [id number] DOB: Sep 23, 1941  02/24/2020  [name] was observed post Covid-19 immunization for 15 minutes without incident. He was provided with Vaccine Information Sheet and instruction to access the V-Safe system.   [name] was instructed to call 911 with any severe reactions post vaccine: Marland Kitchen Difficulty breathing  . Swelling of face and throat  . A fast heartbeat  . A bad rash all over body  . Dizziness and weakness   Immunizations Administered    Name Date Dose VIS Date Route   Pfizer COVID-19 Vaccine 02/24/2020 10:04 AM 0.3 mL 11/05/2019 Intramuscular   Manufacturer: [place]   Lot: [id number]   [place]: [id number]

## 2020-02-27 DIAGNOSIS — J9620 Acute and chronic respiratory failure, unspecified whether with hypoxia or hypercapnia: Secondary | ICD-10-CM | POA: Diagnosis not present

## 2020-02-29 DIAGNOSIS — I6523 Occlusion and stenosis of bilateral carotid arteries: Secondary | ICD-10-CM | POA: Diagnosis not present

## 2020-02-29 DIAGNOSIS — I714 Abdominal aortic aneurysm, without rupture: Secondary | ICD-10-CM | POA: Diagnosis not present

## 2020-02-29 DIAGNOSIS — I5023 Acute on chronic systolic (congestive) heart failure: Secondary | ICD-10-CM | POA: Diagnosis not present

## 2020-02-29 DIAGNOSIS — I25708 Atherosclerosis of coronary artery bypass graft(s), unspecified, with other forms of angina pectoris: Secondary | ICD-10-CM | POA: Diagnosis not present

## 2020-02-29 DIAGNOSIS — R0602 Shortness of breath: Secondary | ICD-10-CM | POA: Diagnosis not present

## 2020-03-03 DIAGNOSIS — G4730 Sleep apnea, unspecified: Secondary | ICD-10-CM | POA: Diagnosis not present

## 2020-03-06 ENCOUNTER — Telehealth: Payer: Self-pay

## 2020-03-06 DIAGNOSIS — R053 Chronic cough: Secondary | ICD-10-CM

## 2020-03-06 DIAGNOSIS — R0989 Other specified symptoms and signs involving the circulatory and respiratory systems: Secondary | ICD-10-CM

## 2020-03-06 DIAGNOSIS — R05 Cough: Secondary | ICD-10-CM

## 2020-03-06 NOTE — Telephone Encounter (Signed)
He needs a follow up chest xr to see if infection Is still improving or not. He has follow up with pulmonology scheduled the the 21st, but if he gets worse he should come here for early follow up.

## 2020-03-06 NOTE — Telephone Encounter (Signed)
Copied from [place] [id number]. Topic: General - Other >> Mar 06, 2020  2:01 PM Rainey Pines A wrote: Patient would like a callback from Dr .Sabino Snipes nurse in regards to what he would advise patient to do. Patient is still experiencing a cough and vomiting and stated that he feels he was misdiagnosed with sinus infection. Patient also stated that he went to hospital and his heart doctor but the his issue still wasn't resolved. Please advise

## 2020-03-07 NOTE — Addendum Note (Signed)
Addended by: Jules Schick on: 03/07/2020 11:30 AM   Modules accepted: Orders

## 2020-03-07 NOTE — Telephone Encounter (Signed)
LMTCB, okay for [facility] triage to advise patient and order CXR.

## 2020-03-07 NOTE — Telephone Encounter (Signed)
Patient reports he is still coughing and sometimes the coughing with mucus makes him vomit. This has been going on for 2 months- happens at least once weekly although yesterday he did vomit twice.Patient states he has [facility]- [facility] Patient is requesting appointment- call to office- request note for review to decide when to schedule patient. Please call patient on cell number.

## 2020-03-07 NOTE — Telephone Encounter (Signed)
He can come in at 10am tomorrow.  In the meantime, he should be taking OTC mucinex 1200mg  every 12 hours, or 600mg  every six hours.

## 2020-03-07 NOTE — Telephone Encounter (Signed)
Patient wife advised and verbalized understanding. Appointment scheduled.

## 2020-03-07 NOTE — Addendum Note (Signed)
Addended by: Birdie Sons on: 03/07/2020 03:30 PM   Modules accepted: Orders

## 2020-03-08 ENCOUNTER — Ambulatory Visit (INDEPENDENT_AMBULATORY_CARE_PROVIDER_SITE_OTHER): Payer: Medicare Other | Admitting: Family Medicine

## 2020-03-08 ENCOUNTER — Other Ambulatory Visit: Payer: Self-pay

## 2020-03-08 VITALS — BP 110/76 | HR 56 | Temp 96.9°F | Wt 209.6 lb

## 2020-03-08 DIAGNOSIS — R05 Cough: Secondary | ICD-10-CM | POA: Diagnosis not present

## 2020-03-08 DIAGNOSIS — R053 Chronic cough: Secondary | ICD-10-CM

## 2020-03-08 DIAGNOSIS — Z8616 Personal history of COVID-19: Secondary | ICD-10-CM

## 2020-03-08 MED ORDER — HYDROCODONE-HOMATROPINE 5-1.5 MG/5ML PO SYRP: 5.0000 mL | ORAL_SOLUTION | Freq: Three times a day (TID) | ORAL | 0 refills | Status: DC | PRN

## 2020-03-08 NOTE — Progress Notes (Signed)
Established patient visit     Patient: Taylor [name]   DOB: 31-May-1941   79 y.o. Male  MRN: [id number] Visit Date: 03/08/2020  Today's healthcare provider: Lelon Huh, MD  Subjective:    Chief Complaint  Patient presents with  . Cough  . Bronchitis   I, Daine Gip as a scribe for Lelon Huh, MD.,have documented all relevant documentation on the behalf of Lelon Huh, MD,as directed by  Lelon Huh, MD while in the presence of Lelon Huh, MD  HPI  Follow up for Acute bronchitis due to COVID-19 virus:  The patient was last seen for this 1 month ago. Changes made at last visit include CXR ordered. Results showed clearing of the lungs. Patient advised to follow up with Pulmonary as scheduled.  He reports good compliance with treatment. Patient states symptoms are unchanged. She is experiencing cough, congestion, and yellow colored mucus. Patient has tried Mucinex with mild relief.\Rarely coughs during the day when he is up and moving around, but as soon as he sits down in recliner he starts having severe coughing spells that sometimes lead to vomiting. No fevers or chills. Otherwise no dyspnea.      Medications: Outpatient Medications Prior to Visit  Medication Sig  . allopurinol (ZYLOPRIM) 300 MG tablet TAKE 2 TABLETS(600 MG) BY MOUTH DAILY  . aspirin 81 MG EC tablet Take 81 mg by mouth daily.   Marland Kitchen atorvastatin (LIPITOR) 40 MG tablet TAKE 1 TABLET BY MOUTH EVERY DAY  . carvedilol (COREG) 12.5 MG tablet TAKE 1 TABLET BY MOUTH DAILY  . Cyanocobalamin (VITAMIN B-12 CR) 1000 MCG TBCR Take 1,000 mcg by mouth daily.   . fluticasone (FLONASE) 50 MCG/ACT nasal spray SHAKE LIQUID AND USE 2 SPRAYS IN EACH NOSTRIL DAILY  . glucose blood (ONETOUCH VERIO) test strip Use as instructed to check sugar daily  . indomethacin (INDOCIN) 50 MG capsule Take 1 capsule (50 mg total) by mouth 3 (three) times daily as needed (for gout flares).  . losartan (COZAAR) 50 MG tablet  TAKE 2 TABLETS(100 MG) BY MOUTH DAILY (Patient taking differently: Take 50 mg by mouth daily. )  . metolazone (ZAROXOLYN) 5 MG tablet Take 1 tablet by mouth daily.  . pantoprazole (PROTONIX) 40 MG tablet TAKE 1 TABLET BY MOUTH EVERY DAY  . budesonide-formoterol (SYMBICORT) 160-4.5 MCG/ACT inhaler Inhale 2 puffs into the lungs 2 (two) times daily. (Patient not taking: Reported on 03/08/2020)  . COMBIVENT RESPIMAT 20-100 MCG/ACT AERS respimat SMARTSIG:2 Inhalation Via Inhaler 4 Times Daily PRN  . nystatin (MYCOSTATIN) 100000 UNIT/ML suspension Take 5 mLs by mouth 4 (four) times daily.  . polyethylene glycol (MIRALAX / GLYCOLAX) 17 g packet Take 17 g by mouth daily as needed for mild constipation or moderate constipation. (Patient not taking: Reported on 03/08/2020)  . torsemide (DEMADEX) 20 MG tablet Take 1 tablet (20 mg total) by mouth daily for 7 days.  Marland Kitchen triamcinolone ointment (KENALOG) 0.1 % Apply 1 application topically 2 (two) times daily as needed (itching). (Patient not taking: Reported on 03/08/2020)   No facility-administered medications prior to visit.    Review of Systems  Constitutional: Positive for chills. Negative for fever.  HENT: Positive for congestion.   Respiratory: Positive for cough.   Cardiovascular: Negative.   Musculoskeletal: Negative.         Objective:    BP 110/76 (BP Location: Right Arm, Patient Position: Sitting, Cuff Size: Large)   Pulse (!) 56   Temp (!) 96.9 F (36.1  C) (Temporal)   Wt 209 lb 9.6 oz (95.1 kg)   SpO2 97%   BMI 30.07 kg/m    Physical Exam   General: Appearance:    Obese male in no acute distress  Eyes:    PERRL, conjunctiva/corneas clear, EOM's intact       Lungs:     Clear to auscultation bilaterally, respirations unlabored  Heart:    Bradycardic. Normal rhythm. No murmurs, rubs, or gallops.   MS:   All extremities are intact.   Neurologic:   Awake, alert, oriented x 3. No apparent focal neurological           defect.         No results found for any visits on 03/08/20.    Assessment & Plan:    1. Cough, persistent since having had Covid  - HYDROcodone-homatropine (HYCODAN) 5-1.5 MG/5ML syrup; Take 5 mLs by mouth every 8 (eight) hours as needed for cough.  Dispense: 120 mL; Refill: 0  2. History of COVID-19  - HYDROcodone-homatropine (HYCODAN) 5-1.5 MG/5ML syrup; Take 5 mLs by mouth every 8 (eight) hours as needed for cough.  Dispense: 120 mL; Refill: 0   Return if symptoms worsen or fail to improve.      Lelon Huh, MD  [facility] [phone] (phone) [phone] (fax)  [place]

## 2020-03-13 ENCOUNTER — Ambulatory Visit: Payer: Self-pay | Admitting: Family Medicine

## 2020-03-20 ENCOUNTER — Telehealth: Payer: Self-pay

## 2020-03-20 NOTE — Telephone Encounter (Signed)
Copied from [place] [id number]. Topic: Quick Communication - See Telephone Encounter >> Mar 20, 2020  9:24 AM Blase Mess A wrote: CRM for notification. See Telephone encounter for: 03/20/20.  Patient calling to rescheudle AWV nurse template not available. Please advise with the Patient.

## 2020-03-21 ENCOUNTER — Ambulatory Visit: Payer: Medicare Other | Attending: Internal Medicine

## 2020-03-21 DIAGNOSIS — Z23 Encounter for immunization: Secondary | ICD-10-CM

## 2020-03-21 NOTE — Progress Notes (Signed)
   Covid-19 Vaccination Clinic  Name:  Taylor [name]    MRN: [id number] DOB: 05-16-1941  03/21/2020  Taylor [name] was observed post Covid-19 immunization for 15 minutes without incident. He was provided with Vaccine Information Sheet and instruction to access the V-Safe system.   Taylor [name] was instructed to call 911 with any severe reactions post vaccine: Marland Kitchen Difficulty breathing  . Swelling of face and throat  . A fast heartbeat  . A bad rash all over body  . Dizziness and weakness   Immunizations Administered    Name Date Dose VIS Date Route   Pfizer COVID-19 Vaccine 03/21/2020  9:33 AM 0.3 mL 01/19/2019 Intramuscular   Manufacturer: [place]   Lot: [id number]   [place]: [address]-0

## 2020-03-28 DIAGNOSIS — J9620 Acute and chronic respiratory failure, unspecified whether with hypoxia or hypercapnia: Secondary | ICD-10-CM | POA: Diagnosis not present

## 2020-04-02 DIAGNOSIS — G4730 Sleep apnea, unspecified: Secondary | ICD-10-CM | POA: Diagnosis not present

## 2020-04-06 NOTE — Telephone Encounter (Signed)
Cancelled AWV that was scheduled on 04/07/20 due to being out of office on Fridays now. Pt requests an in office apt.

## 2020-04-07 ENCOUNTER — Ambulatory Visit: Payer: Medicare Other

## 2020-04-10 ENCOUNTER — Other Ambulatory Visit: Payer: Self-pay | Admitting: Family Medicine

## 2020-04-13 NOTE — Telephone Encounter (Signed)
Scheduled in office AWV for 04/19/20 @ 8:40 AM.

## 2020-04-17 ENCOUNTER — Telehealth: Payer: Self-pay | Admitting: Family Medicine

## 2020-04-17 DIAGNOSIS — R1114 Bilious vomiting: Secondary | ICD-10-CM | POA: Diagnosis not present

## 2020-04-17 DIAGNOSIS — I5022 Chronic systolic (congestive) heart failure: Secondary | ICD-10-CM | POA: Diagnosis not present

## 2020-04-17 LAB — BASIC METABOLIC PANEL
BUN: 46 — AB (ref 4–21)
CO2: 36 — AB (ref 13–22)
Chloride: 97 — AB (ref 99–108)
Creatinine: 2 — AB (ref 0.6–1.3)
Glucose: 137
Potassium: 3.1 — AB (ref 3.4–5.3)
Sodium: 144 (ref 137–147)

## 2020-04-17 LAB — CBC AND DIFFERENTIAL
HCT: 40 — AB (ref 41–53)
Hemoglobin: 13.3 — AB (ref 13.5–17.5)

## 2020-04-17 LAB — COMPREHENSIVE METABOLIC PANEL: Calcium: 5.8 — AB (ref 8.7–10.7)

## 2020-04-17 NOTE — Progress Notes (Signed)
Had to cancel in office AWV due to other concerns. Scheduled work in with PCP today @ 11:20 AM. This encounter was created in error - please disregard.

## 2020-04-17 NOTE — Telephone Encounter (Signed)
Copied from [place] [id number]. Topic: General - Other >> Apr 17, 2020 11:42 AM Keene Breath wrote: Reason for CRM: Called to inform the doctor or nurse to be expecting a fax with patient's labs that are concerning.  They were taken a few months ago and the concern is the Metabolic panel.  Felt that the doctor should look them over.  If there are any questions, please call at [phone]

## 2020-04-17 NOTE — Telephone Encounter (Signed)
Have you seen any faxes come through on this patient?

## 2020-04-19 ENCOUNTER — Observation Stay
Admission: EM | Admit: 2020-04-19 | Discharge: 2020-04-20 | Disposition: A | Discharge status: Home / Self Care | Payer: Medicare Other | Attending: Hospitalist | Admitting: Hospitalist

## 2020-04-19 ENCOUNTER — Ambulatory Visit (INDEPENDENT_AMBULATORY_CARE_PROVIDER_SITE_OTHER): Payer: Medicare Other | Admitting: Family Medicine

## 2020-04-19 ENCOUNTER — Other Ambulatory Visit: Payer: Self-pay

## 2020-04-19 ENCOUNTER — Encounter: Payer: Self-pay | Admitting: Emergency Medicine

## 2020-04-19 ENCOUNTER — Encounter: Payer: Self-pay | Admitting: Family Medicine

## 2020-04-19 DIAGNOSIS — L4050 Arthropathic psoriasis, unspecified: Secondary | ICD-10-CM | POA: Insufficient documentation

## 2020-04-19 DIAGNOSIS — E876 Hypokalemia: Secondary | ICD-10-CM | POA: Insufficient documentation

## 2020-04-19 DIAGNOSIS — Z8261 Family history of arthritis: Secondary | ICD-10-CM | POA: Insufficient documentation

## 2020-04-19 DIAGNOSIS — Z7951 Long term (current) use of inhaled steroids: Secondary | ICD-10-CM | POA: Insufficient documentation

## 2020-04-19 DIAGNOSIS — N1832 Chronic kidney disease, stage 3b: Secondary | ICD-10-CM | POA: Insufficient documentation

## 2020-04-19 DIAGNOSIS — I4891 Unspecified atrial fibrillation: Secondary | ICD-10-CM | POA: Diagnosis not present

## 2020-04-19 DIAGNOSIS — R202 Paresthesia of skin: Secondary | ICD-10-CM | POA: Insufficient documentation

## 2020-04-19 DIAGNOSIS — I2510 Atherosclerotic heart disease of native coronary artery without angina pectoris: Secondary | ICD-10-CM | POA: Insufficient documentation

## 2020-04-19 DIAGNOSIS — I130 Hypertensive heart and chronic kidney disease with heart failure and stage 1 through stage 4 chronic kidney disease, or unspecified chronic kidney disease: Secondary | ICD-10-CM | POA: Diagnosis not present

## 2020-04-19 DIAGNOSIS — Z20822 Contact with and (suspected) exposure to covid-19: Secondary | ICD-10-CM | POA: Insufficient documentation

## 2020-04-19 DIAGNOSIS — R531 Weakness: Secondary | ICD-10-CM | POA: Insufficient documentation

## 2020-04-19 DIAGNOSIS — R9431 Abnormal electrocardiogram [ECG] [EKG]: Secondary | ICD-10-CM

## 2020-04-19 DIAGNOSIS — G4730 Sleep apnea, unspecified: Secondary | ICD-10-CM | POA: Diagnosis not present

## 2020-04-19 DIAGNOSIS — N179 Acute kidney failure, unspecified: Secondary | ICD-10-CM | POA: Diagnosis not present

## 2020-04-19 DIAGNOSIS — Z95810 Presence of automatic (implantable) cardiac defibrillator: Secondary | ICD-10-CM | POA: Insufficient documentation

## 2020-04-19 DIAGNOSIS — I252 Old myocardial infarction: Secondary | ICD-10-CM | POA: Diagnosis not present

## 2020-04-19 DIAGNOSIS — M109 Gout, unspecified: Secondary | ICD-10-CM | POA: Diagnosis not present

## 2020-04-19 DIAGNOSIS — Z87891 Personal history of nicotine dependence: Secondary | ICD-10-CM | POA: Diagnosis not present

## 2020-04-19 DIAGNOSIS — R7303 Prediabetes: Secondary | ICD-10-CM | POA: Insufficient documentation

## 2020-04-19 DIAGNOSIS — I429 Cardiomyopathy, unspecified: Secondary | ICD-10-CM | POA: Insufficient documentation

## 2020-04-19 DIAGNOSIS — Z951 Presence of aortocoronary bypass graft: Secondary | ICD-10-CM | POA: Diagnosis not present

## 2020-04-19 DIAGNOSIS — Z85820 Personal history of malignant melanoma of skin: Secondary | ICD-10-CM | POA: Insufficient documentation

## 2020-04-19 DIAGNOSIS — E785 Hyperlipidemia, unspecified: Secondary | ICD-10-CM | POA: Diagnosis not present

## 2020-04-19 DIAGNOSIS — Z9842 Cataract extraction status, left eye: Secondary | ICD-10-CM | POA: Insufficient documentation

## 2020-04-19 DIAGNOSIS — R200 Anesthesia of skin: Secondary | ICD-10-CM | POA: Diagnosis not present

## 2020-04-19 DIAGNOSIS — Z955 Presence of coronary angioplasty implant and graft: Secondary | ICD-10-CM | POA: Diagnosis not present

## 2020-04-19 DIAGNOSIS — I5022 Chronic systolic (congestive) heart failure: Secondary | ICD-10-CM | POA: Insufficient documentation

## 2020-04-19 DIAGNOSIS — Z808 Family history of malignant neoplasm of other organs or systems: Secondary | ICD-10-CM | POA: Insufficient documentation

## 2020-04-19 DIAGNOSIS — Z8551 Personal history of malignant neoplasm of bladder: Secondary | ICD-10-CM | POA: Insufficient documentation

## 2020-04-19 DIAGNOSIS — Z8719 Personal history of other diseases of the digestive system: Secondary | ICD-10-CM | POA: Diagnosis not present

## 2020-04-19 DIAGNOSIS — K219 Gastro-esophageal reflux disease without esophagitis: Secondary | ICD-10-CM | POA: Diagnosis not present

## 2020-04-19 DIAGNOSIS — Z961 Presence of intraocular lens: Secondary | ICD-10-CM | POA: Insufficient documentation

## 2020-04-19 DIAGNOSIS — R1114 Bilious vomiting: Secondary | ICD-10-CM

## 2020-04-19 DIAGNOSIS — Z888 Allergy status to other drugs, medicaments and biological substances status: Secondary | ICD-10-CM | POA: Insufficient documentation

## 2020-04-19 DIAGNOSIS — Z8616 Personal history of COVID-19: Secondary | ICD-10-CM

## 2020-04-19 DIAGNOSIS — Z8249 Family history of ischemic heart disease and other diseases of the circulatory system: Secondary | ICD-10-CM | POA: Insufficient documentation

## 2020-04-19 DIAGNOSIS — Z9841 Cataract extraction status, right eye: Secondary | ICD-10-CM | POA: Insufficient documentation

## 2020-04-19 DIAGNOSIS — Z8673 Personal history of transient ischemic attack (TIA), and cerebral infarction without residual deficits: Secondary | ICD-10-CM | POA: Diagnosis not present

## 2020-04-19 DIAGNOSIS — Z881 Allergy status to other antibiotic agents status: Secondary | ICD-10-CM | POA: Insufficient documentation

## 2020-04-19 DIAGNOSIS — Z7982 Long term (current) use of aspirin: Secondary | ICD-10-CM | POA: Insufficient documentation

## 2020-04-19 DIAGNOSIS — Z79899 Other long term (current) drug therapy: Secondary | ICD-10-CM | POA: Insufficient documentation

## 2020-04-19 DIAGNOSIS — Z800 Family history of malignant neoplasm of digestive organs: Secondary | ICD-10-CM | POA: Insufficient documentation

## 2020-04-19 DIAGNOSIS — I454 Nonspecific intraventricular block: Secondary | ICD-10-CM | POA: Insufficient documentation

## 2020-04-19 HISTORY — DX: Hypocalcemia: E83.51

## 2020-04-19 LAB — CBC
HCT: 39.3 % (ref 39.0–52.0)
Hemoglobin: 13 g/dL (ref 13.0–17.0)
MCH: 33.2 pg (ref 26.0–34.0)
MCHC: 33.1 g/dL (ref 30.0–36.0)
MCV: 100.5 fL — ABNORMAL HIGH (ref 80.0–100.0)
Platelets: 134 10*3/uL — ABNORMAL LOW (ref 150–400)
RBC: 3.91 MIL/uL — ABNORMAL LOW (ref 4.22–5.81)
RDW: 15.8 % — ABNORMAL HIGH (ref 11.5–15.5)
WBC: 9 10*3/uL (ref 4.0–10.5)
nRBC: 0 % (ref 0.0–0.2)

## 2020-04-19 LAB — BASIC METABOLIC PANEL
Anion gap: 15 (ref 5–15)
BUN: 44 mg/dL — ABNORMAL HIGH (ref 8–23)
CO2: 29 mmol/L (ref 22–32)
Calcium: 6.4 mg/dL — CL (ref 8.9–10.3)
Chloride: 95 mmol/L — ABNORMAL LOW (ref 98–111)
Creatinine, Ser: 1.78 mg/dL — ABNORMAL HIGH (ref 0.61–1.24)
GFR calc Af Amer: 42 mL/min — ABNORMAL LOW (ref >60)
GFR calc non Af Amer: 36 mL/min — ABNORMAL LOW (ref >60)
Glucose, Bld: 125 mg/dL — ABNORMAL HIGH (ref 70–99)
Potassium: 3 mmol/L — ABNORMAL LOW (ref 3.5–5.1)
Sodium: 139 mmol/L (ref 135–145)

## 2020-04-19 LAB — HEPATIC FUNCTION PANEL
ALT: 13 U/L (ref 0–44)
AST: 26 U/L (ref 15–41)
Albumin: 4 g/dL (ref 3.5–5.0)
Alkaline Phosphatase: 92 U/L (ref 38–126)
Bilirubin, Direct: 0.3 mg/dL — ABNORMAL HIGH (ref 0.0–0.2)
Indirect Bilirubin: 1.3 mg/dL — ABNORMAL HIGH (ref 0.3–0.9)
Total Bilirubin: 1.6 mg/dL — ABNORMAL HIGH (ref 0.3–1.2)
Total Protein: 8.2 g/dL — ABNORMAL HIGH (ref 6.5–8.1)

## 2020-04-19 LAB — PHOSPHORUS: Phosphorus: 4.1 mg/dL (ref 2.5–4.6)

## 2020-04-19 LAB — TROPONIN I (HIGH SENSITIVITY)
Troponin I (High Sensitivity): 34 ng/L — ABNORMAL HIGH (ref <18)
Troponin I (High Sensitivity): 34 ng/L — ABNORMAL HIGH (ref <18)

## 2020-04-19 LAB — MAGNESIUM: Magnesium: 0.7 mg/dL — CL (ref 1.7–2.4)

## 2020-04-19 LAB — SARS CORONAVIRUS 2 BY RT PCR (HOSPITAL ORDER, PERFORMED IN ~~LOC~~ HOSPITAL LAB): SARS Coronavirus 2: NEGATIVE

## 2020-04-19 LAB — VITAMIN D 25 HYDROXY (VIT D DEFICIENCY, FRACTURES): Vit D, 25-Hydroxy: 46.58 ng/mL (ref 30–100)

## 2020-04-19 MED ORDER — CALCIUM CARBONATE ANTACID 500 MG PO CHEW
800.0000 mg | CHEWABLE_TABLET | Freq: Once | ORAL | Status: AC
  Administered 2020-04-19: 800 mg via ORAL
  Filled 2020-04-19: qty 4

## 2020-04-19 MED ORDER — ASPIRIN 81 MG PO TBEC
81.0000 mg | DELAYED_RELEASE_TABLET | Freq: Every day | ORAL | Status: DC
  Administered 2020-04-20: 81 mg via ORAL
  Filled 2020-04-19 (×2): qty 1

## 2020-04-19 MED ORDER — POTASSIUM CHLORIDE CRYS ER 20 MEQ PO TBCR
40.0000 meq | EXTENDED_RELEASE_TABLET | Freq: Once | ORAL | Status: AC
  Administered 2020-04-19: 40 meq via ORAL
  Filled 2020-04-19: qty 2

## 2020-04-19 MED ORDER — MAGNESIUM SULFATE 2 GM/50ML IV SOLN
2.0000 g | INTRAVENOUS | Status: AC
  Administered 2020-04-19 (×2): 2 g via INTRAVENOUS
  Filled 2020-04-19 (×2): qty 50

## 2020-04-19 MED ORDER — CARVEDILOL 6.25 MG PO TABS
12.5000 mg | ORAL_TABLET | Freq: Every day | ORAL | Status: DC
  Administered 2020-04-20: 12.5 mg via ORAL
  Filled 2020-04-19: qty 2

## 2020-04-19 MED ORDER — CALCIUM GLUCONATE-NACL 1-0.675 GM/50ML-% IV SOLN
1.0000 g | Freq: Once | INTRAVENOUS | Status: AC
  Administered 2020-04-19: 1000 mg via INTRAVENOUS
  Filled 2020-04-19: qty 50

## 2020-04-19 MED ORDER — ENOXAPARIN SODIUM 40 MG/0.4ML ~~LOC~~ SOLN
40.0000 mg | SUBCUTANEOUS | Status: DC
  Administered 2020-04-19 – 2020-04-20 (×2): 40 mg via SUBCUTANEOUS
  Filled 2020-04-19 (×2): qty 0.4

## 2020-04-19 MED ORDER — SODIUM CHLORIDE 0.9 % IV SOLN: Freq: Once | INTRAVENOUS | Status: AC

## 2020-04-19 MED ORDER — MAGNESIUM SULFATE 2 GM/50ML IV SOLN
2.0000 g | Freq: Once | INTRAVENOUS | Status: AC
  Administered 2020-04-19: 2 g via INTRAVENOUS
  Filled 2020-04-19: qty 50

## 2020-04-19 MED ORDER — ACETAMINOPHEN 500 MG PO TABS
1000.0000 mg | ORAL_TABLET | Freq: Once | ORAL | Status: AC
  Administered 2020-04-19: 1000 mg via ORAL

## 2020-04-19 MED ORDER — POTASSIUM CHLORIDE 10 MEQ/100ML IV SOLN
10.0000 meq | INTRAVENOUS | Status: AC
  Administered 2020-04-19 (×3): 10 meq via INTRAVENOUS
  Filled 2020-04-19 (×3): qty 100

## 2020-04-19 MED ORDER — ATORVASTATIN CALCIUM 20 MG PO TABS
40.0000 mg | ORAL_TABLET | Freq: Every day | ORAL | Status: DC
  Administered 2020-04-20: 40 mg via ORAL
  Filled 2020-04-19: qty 2

## 2020-04-19 MED ORDER — ALLOPURINOL 300 MG PO TABS
600.0000 mg | ORAL_TABLET | Freq: Every day | ORAL | Status: DC
  Administered 2020-04-20: 600 mg via ORAL
  Filled 2020-04-19: qty 2

## 2020-04-19 NOTE — ED Provider Notes (Addendum)
[facility] Emergency Department Provider Note  ____________________________________________   First MD Initiated Contact with Patient 04/19/20 1258     (approximate)  I have reviewed the triage vital signs and the nursing notes.   HISTORY  Chief Complaint Abnormal Lab    HPI Taylor [name] is a 79 y.o. male  Here with weakness. Pt reports that for the past week he has had generalized weakness, numbness/tingling in his hands and mouth. He went to his PCP and told him about these sx today, and had labs drawn at pulmonologist earlier this week. His labs showed hypoK and hypocalcemia so he was sent here for evaluation. He reports generalized weakness, fatigue, and muscle fatigue. He has had difficulty performing repetitive tasks due to weakness. He notes he recently restarted his lasix last week, prior to the onset of sx. No recent diet or med changes. No other acute changes. No pain. No weight loss or night sweats.        Past Medical History:  Diagnosis Date  . AAA (abdominal aortic aneurysm) [facility]) 06/03/2007   [facility]; Dr. Kellie Simmering  . AICD (automatic cardioverter/defibrillator) present   . Barrett's esophagus   . Bladder cancer ([place])   . Bradycardia   . CAD (coronary artery disease)   . CHF (congestive heart failure) ([place])   . Cluster headache   . DDD (degenerative disc disease), lumbar   . Dyspnea    WITH EXERTION  . Edema    LEFT ANKLE  . Fracture of skull base ([place]) 1997   due to fall  . GERD (gastroesophageal reflux disease)   . Gout   . History of bladder cancer 12/1995  . Hyperlipidemia   . Hypertension   . Malignant melanoma ([place]) 12/2012   right dorsal forearm excised  . Myocardial infarction ([place])    LAST 2014  . Osteoarthritis of knee   . Pacemaker 10/10/2006  . Pneumonia    2016  . Pre-diabetes   . Psoriasis   . Rib fracture 1997   due to fall  . Sleep apnea    CPAP  . Stroke ([place])   . Venous  incompetence     Patient Active Problem List   Diagnosis Date Noted  . Hypocalcemia 04/19/2020  . Numbness and tingling in both hands 04/19/2020  . Atrial fibrillation ([place]) 04/19/2020  . Acute respiratory failure with hypoxia ([place]) 12/06/2019  . Other specified interstitial pulmonary diseases ([place]) 12/06/2019  . Abdominal pain   . Suspected COVID-19 virus infection 11/22/2019  . Pancreatitis 11/22/2019  . Leukocytosis 11/22/2019  . CAP (community acquired pneumonia) 11/13/2019  . Hyperuricemia 03/31/2019  . Numbness 05/07/2018  . Benign colonic polyp 03/24/2017  . Tibialis anterior tenosynovitis 12/21/2015  . Pleural nodule 07/27/2015  . Psoriatic arthritis ([place]) 05/12/2015  . Restrictive lung disease 05/12/2015  . Barrett esophagus 03/30/2015  . Ache in joint 03/30/2015  . Bradycardia 03/30/2015  . Arteriosclerosis of coronary artery 03/30/2015  . Cardiac defibrillator in place 03/30/2015  . Chronic kidney disease (CKD), stage III (moderate) 03/30/2015  . Cluster headache syndrome 03/30/2015  . Degeneration of lumbar or lumbosacral intervertebral disc 03/30/2015  . Gout 03/30/2015  . Personal history of malignant neoplasm of bladder 03/30/2015  . Hypercholesteremia 03/30/2015  . LBP (low back pain) 03/30/2015  . Personal history of malignant melanoma of skin 03/30/2015  . Muscle ache 03/30/2015  . Arthritis of knee, degenerative 03/30/2015  . Prediabetes 03/30/2015  . Psoriasis 03/30/2015  .  Obstructive sleep apnea 03/30/2015  . Chronic venous insufficiency 03/30/2015  . History of abdominal aortic aneurysm (AAA) 06/24/2013  . History of CVA (cerebrovascular accident) 06/24/2013  . Cardiomyopathy, ischemic 06/24/2013  . History of AAA (abdominal aortic aneurysm) repair 06/24/2013  . Essential (primary) hypertension 08/17/2011  . Arrhythmia, sinus node 08/17/2011  . History of ventricular fibrillation 08/17/2011  . Systolic CHF with reduced left ventricular function,  NYHA class 3 ([place]) 02/26/2010  . 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 Cardiac pacemaker in situ 10/10/2006    Past Surgical History:  Procedure Laterality Date  . ABDOMINAL AORTIC ANEURYSM REPAIR  06/03/2007   [facility]; Dr. Kellie Simmering  . ANGIOPLASTY  1994   MI  . BLADDER TUMOR EXCISION  12/1995  . CATARACT EXTRACTION W/PHACO Left 10/22/2016   Procedure: CATARACT EXTRACTION PHACO AND INTRAOCULAR LENS PLACEMENT (IOC);  Surgeon: Birder Robson, MD;  Location: [facility] ORS;  Service: Ophthalmology;  Laterality: Left;  [place] 47.7 AP% 18.4 CDE 8.78 Fluid pack lot # [id number]  . CATARACT EXTRACTION W/PHACO Right 12/10/2016   Procedure: CATARACT EXTRACTION PHACO AND INTRAOCULAR LENS PLACEMENT (IOC);  Surgeon: Birder Robson, MD;  Location: [facility] ORS;  Service: Ophthalmology;  Laterality: Right;  [place] 00:39 AP% 23.3 CDE 9.13 Fluid pack lot # [id number]  . CORONARY ANGIOPLASTY     STENTS X 5  . CORONARY ARTERY BYPASS GRAFT  09/22/2006   four  . ELBOW BURSA SURGERY     DUE TO GOUT  . INSERT / REPLACE / REMOVE PACEMAKER    . MELANOMA EXCISION  12/2012   Right forearm    Prior to Admission medications   Medication Sig Start Date End Date Taking? Authorizing Provider  allopurinol (ZYLOPRIM) 300 MG tablet TAKE 2 TABLETS(600 MG) BY MOUTH DAILY 01/04/20   Birdie Sons, MD  aspirin 81 MG EC tablet Take 81 mg by mouth daily.     [provider]  atorvastatin (LIPITOR) 40 MG tablet TAKE 1 TABLET BY MOUTH EVERY DAY 04/10/20   Birdie Sons, MD  budesonide-formoterol [facility]) 160-4.5 MCG/ACT inhaler Inhale 2 puffs into the lungs 2 (two) times daily. Patient not taking: Reported on 03/08/2020 12/06/19   Birdie Sons, MD  carvedilol (COREG) 12.5 MG tablet TAKE 1 TABLET BY MOUTH DAILY 08/31/19   Birdie Sons, MD  COMBIVENT RESPIMAT 20-100 MCG/ACT AERS respimat SMARTSIG:2 Inhalation Via Inhaler 4 Times Daily PRN 12/09/19   [provider]  Cyanocobalamin (VITAMIN B-12 CR) 1000 MCG TBCR Take 1,000 mcg by mouth daily.     [provider]  fluticasone (FLONASE) 50 MCG/ACT nasal spray SHAKE LIQUID AND USE 2 SPRAYS IN [facility] NOSTRIL DAILY 12/12/19   Birdie Sons, MD  glucose blood (ONETOUCH VERIO) test strip Use as instructed to check sugar daily 12/14/19   Birdie Sons, MD  HYDROcodone-homatropine [facility]) 5-1.5 MG/5ML syrup Take 5 mLs by mouth every 8 (eight) hours as needed for cough. 03/08/20   Birdie Sons, MD  indomethacin (INDOCIN) 50 MG capsule Take 1 capsule (50 mg total) by mouth 3 (three) times daily as needed (for gout flares). 01/22/19   Birdie Sons, MD  losartan (COZAAR) 50 MG tablet TAKE 2 TABLETS(100 MG) BY MOUTH DAILY Patient taking differently: Take 50 mg by mouth daily.  07/08/19   Birdie Sons, MD  metolazone (ZAROXOLYN) 5 MG tablet Take 1 tablet by mouth daily. 01/04/20 01/03/21  [provider]  nystatin (MYCOSTATIN) 100000 UNIT/ML  suspension Take 5 mLs by mouth 4 (four) times daily. 12/09/19   [provider]  pantoprazole (PROTONIX) 40 MG tablet TAKE 1 TABLET BY MOUTH EVERY DAY 04/09/19   Birdie Sons, MD  polyethylene glycol (MIRALAX / GLYCOLAX) 17 g packet Take 17 g by mouth daily as needed for mild constipation or moderate constipation. Patient not taking: Reported on 03/08/2020 11/28/19   Thornell Mule, MD  torsemide (DEMADEX) 20 MG tablet Take 1 tablet (20 mg total) by mouth daily for 7 days. 11/28/19 01/07/20  Thornell Mule, MD  triamcinolone ointment (KENALOG) 0.1 % Apply 1 application topically 2 (two) times daily as needed (itching). Patient not taking: Reported on 03/08/2020 01/07/20   Birdie Sons, MD    Allergies Amlodipine besylate, Crestor [rosuvastatin], and Rocephin [ceftriaxone]  Family History  Problem Relation Age of Onset  . Cancer Mother        Melanoma skin cancer  . Heart attack Father 37  . Cancer Father        throat cancer  . Arthritis  Brother     Social History Social History   Tobacco Use  . Smoking status: Former Smoker    Packs/day: 1.00    Types: Cigarettes    Quit date: 11/26/1999    Years since quitting: 20.4  . Smokeless tobacco: Never Used  Substance Use Topics  . Alcohol use: No  . Drug use: No    Review of Systems  Review of Systems  Constitutional: Positive for fatigue. Negative for chills and fever.  HENT: Negative for sore throat.   Respiratory: Negative for shortness of breath.   Cardiovascular: Negative for chest pain.  Gastrointestinal: Negative for abdominal pain.  Genitourinary: Negative for flank pain.  Musculoskeletal: Negative for neck pain.  Skin: Negative for rash and wound.  Allergic/Immunologic: Negative for immunocompromised state.  Neurological: Positive for weakness and numbness.  Hematological: Does not bruise/bleed easily.  All other systems reviewed and are negative.    ____________________________________________  PHYSICAL EXAM:      VITAL SIGNS: ED Triage Vitals  Enc Vitals Group     BP 04/19/20 1105 (!) 95/57     Pulse Rate 04/19/20 1105 62     Resp 04/19/20 1105 18     Temp --      Temp src --      SpO2 04/19/20 1105 97 %     Weight 04/19/20 1035 212 lb (96.2 kg)     Height 04/19/20 1035 5\' 10"  (1.778 m)     Head Circumference --      Peak Flow --      Pain Score 04/19/20 1035 0     Pain Loc --      Pain Edu? --      Excl. in [place]? --      Physical Exam Vitals and nursing note reviewed.  Constitutional:      General: He is not in acute distress.    Appearance: He is well-developed.  HENT:     Head: Normocephalic and atraumatic.  Eyes:     Conjunctiva/sclera: Conjunctivae normal.  Cardiovascular:     Rate and Rhythm: Normal rate and regular rhythm.     Heart sounds: Normal heart sounds. No murmur. No friction rub.  Pulmonary:     Effort: Pulmonary effort is normal. No respiratory distress.     Breath sounds: No wheezing or rales.  Abdominal:      General: There is no distension.     Palpations:  Abdomen is soft.     Tenderness: There is no abdominal tenderness.  Musculoskeletal:     Cervical back: Neck supple.  Skin:    General: Skin is warm.     Capillary Refill: Capillary refill takes less than 2 seconds.  Neurological:     Mental Status: He is alert and oriented to person, place, and time.     Motor: No abnormal muscle tone.       ____________________________________________   LABS (all labs ordered are listed, but only abnormal results are displayed)  Labs Reviewed  BASIC METABOLIC PANEL - Abnormal; Notable for the following components:      Result Value   Potassium 3.0 (*)    Chloride 95 (*)    Glucose, Bld 125 (*)    BUN 44 (*)    Creatinine, Ser 1.78 (*)    Calcium 6.4 (*)    GFR calc non Af Amer 36 (*)    GFR calc Af Amer 42 (*)    All other components within normal limits  CBC - Abnormal; Notable for the following components:   RBC 3.91 (*)    MCV 100.5 (*)    RDW 15.8 (*)    Platelets 134 (*)    All other components within normal limits  TROPONIN I (HIGH SENSITIVITY) - Abnormal; Notable for the following components:   Troponin I (High Sensitivity) 34 (*)    All other components within normal limits  MAGNESIUM  HEPATIC FUNCTION PANEL  TROPONIN I (HIGH SENSITIVITY)    ____________________________________________  EKG: Atrial paced rhythm. QRS 140, QTc 604. Non-specific IVC block. When compared to prior, QT has significantly prolonged. ________________________________________  RADIOLOGY All imaging, including plain films, CT scans, and ultrasounds, independently reviewed by me, and interpretations confirmed via formal radiology reads.  ED MD interpretation:   None  Official radiology report(s): No results found.  ____________________________________________  PROCEDURES   Procedure(s) performed (including Critical Care):  .1-3 Lead EKG Interpretation Performed by: Duffy Bruce,  MD Authorized by: Duffy Bruce, MD     Interpretation: normal     ECG rate:  60-70s   ECG rate assessment: normal     Rhythm: sinus rhythm     Ectopy: PVCs     Conduction: abnormal   Comments:     Indication: Severe hypocalcemia  .Critical Care Performed by: Duffy Bruce, MD Authorized by: Duffy Bruce, MD   Critical care provider statement:    Critical care time (minutes):  35   Critical care time was exclusive of:  Separately billable procedures and treating other patients and teaching time   Critical care was necessary to treat or prevent imminent or life-threatening deterioration of the following conditions:  Circulatory failure, cardiac failure and metabolic crisis   Critical care was time spent personally by me on the following activities:  Development of treatment plan with patient or surrogate, discussions with consultants, evaluation of patient's response to treatment, examination of patient, obtaining history from patient or surrogate, ordering and performing treatments and interventions, ordering and review of laboratory studies, ordering and review of radiographic studies, pulse oximetry, re-evaluation of patient's condition and review of old charts   I assumed direction of critical care for this patient from another provider in my specialty: no      ____________________________________________  INITIAL IMPRESSION / MDM / [place] / ED COURSE  As part of my medical decision making, I reviewed the following data within the [place] notes reviewed and incorporated,  Old chart reviewed, Notes from prior ED visits, and [place] Controlled Substance Database       *Taylor [name] was evaluated in Emergency Department on 04/19/2020 for the symptoms described in the history of present illness. He was evaluated in the context of the global COVID-19 pandemic, which necessitated consideration that the patient might be at risk for infection with  the SARS-CoV-2 virus that causes COVID-19. Institutional protocols and algorithms that pertain to the evaluation of patients at risk for COVID-19 are in a state of rapid change based on information released by regulatory bodies including the CDC and federal and state organizations. These policies and algorithms were followed during the patient's care in the ED.  Some ED evaluations and interventions may be delayed as a result of limited staffing during the pandemic.*     Medical Decision Making:  79 yo M here with profound hypocalcemia, hypokalemia. Last albumin was WNL - repeat is pending. Pt has marked prolonged QT on EKG here with symptoms of paresthesias, perioral numbness. Will start IV Calcium, K, and Mag replacement. Likely related to recent increase in diuretics. Admit given his EKG findings and degree of lyte abnormalities.  ____________________________________________  FINAL CLINICAL IMPRESSION(S) / ED DIAGNOSES  Final diagnoses:  None     MEDICATIONS GIVEN DURING THIS VISIT:  Medications  calcium carbonate (TUMS - dosed in mg elemental calcium) chewable tablet 800 mg of elemental calcium (has no administration in time range)  potassium chloride SA (KLOR-CON) CR tablet 40 mEq (has no administration in time range)  calcium gluconate 1 g/ 50 mL sodium chloride IVPB (has no administration in time range)  potassium chloride 10 mEq in 100 mL IVPB (has no administration in time range)  magnesium sulfate IVPB 2 g 50 mL (has no administration in time range)  0.9 %  sodium chloride infusion (has no administration in time range)     ED Discharge Orders    None       Note:  This document was prepared using Dragon voice recognition software and may include unintentional dictation errors.   Duffy Bruce, MD 04/19/20 1338    Duffy Bruce, MD 04/19/20 [phone]

## 2020-04-19 NOTE — Progress Notes (Signed)
Established patient visit   Patient: Taylor [name]   DOB: [birth year]   79 y.o. Male  MRN: [id number] Visit Date: 04/19/2020  Today's healthcare provider: Lelon Huh, MD   Chief Complaint  Patient presents with  . Numbness    x 1 week   Subjective    HPI Numbness: Patient complains of numbness and tingling in his hands and feet for the past week. He reports having episodes of vomiting after eating dinner. He has been spitting up dark brown mucus and has intermittent episodes of shortness of breath and weakness. Patient complains of pressure in his face since yesterday. He denies any chest pain or diarrhea. He feeling weak.  He was seen at [facility] pul[facility]ology 2 days ago for cough and reports xray was normal, but labs found extremely low calcium of 5.8, potassium of 3.1 and Creatinine up to 2.0     From [facility] pulmonary 04-17-2020 Results for orders placed or performed in visit on 97/98/92  Basic metabolic panel  Result Value Ref Range   CO2 36 (A) 13 - 22   Creatinine 2.0 (A) 0.6 - 1.3   Potassium 3.1 (A) 3.4 - 5.3  Comprehensive metabolic panel  Result Value Ref Range   Calcium 5.8 (A) 8.7 - 11.9  Basic metabolic panel  Result Value Ref Range   Glucose 137    BUN 46 (A) 4 - 21   Sodium 144 137 - 147   Chloride 97 (A) 99 - 108  CBC and differential  Result Value Ref Range   Hemoglobin 13.3 (A) 13.5 - 17.5   HCT 40 (A) 41 - 53        Medications: Outpatient Medications Prior to Visit  Medication Sig  . allopurinol (ZYLOPRIM) 300 MG tablet TAKE 2 TABLETS(600 MG) BY MOUTH DAILY  . aspirin 81 MG EC tablet Take 81 mg by mouth daily.   Marland Kitchen atorvastatin (LIPITOR) 40 MG tablet TAKE 1 TABLET BY MOUTH EVERY DAY  . budesonide-formoterol (SYMBICORT) 160-4.5 MCG/ACT inhaler Inhale 2 puffs into the lungs 2 (two) times daily. (Patient not taking: Reported on 03/08/2020)  . carvedilol (COREG) 12.5 MG tablet TAKE 1 TABLET BY MOUTH DAILY  . COMBIVENT RESPIMAT 20-100  MCG/ACT AERS respimat SMARTSIG:2 Inhalation Via Inhaler 4 Times Daily PRN  . Cyanocobalamin (VITAMIN B-12 CR) 1000 MCG TBCR Take 1,000 mcg by mouth daily.   . fluticasone (FLONASE) 50 MCG/ACT nasal spray SHAKE LIQUID AND USE 2 SPRAYS IN EACH NOSTRIL DAILY  . glucose blood (ONETOUCH VERIO) test strip Use as instructed to check sugar daily  . HYDROcodone-homatropine (HYCODAN) 5-1.5 MG/5ML syrup Take 5 mLs by mouth every 8 (eight) hours as needed for cough.  . indomethacin (INDOCIN) 50 MG capsule Take 1 capsule (50 mg total) by mouth 3 (three) times daily as needed (for gout flares).  . losartan (COZAAR) 50 MG tablet TAKE 2 TABLETS(100 MG) BY MOUTH DAILY (Patient taking differently: Take 50 mg by mouth daily. )  . metolazone (ZAROXOLYN) 5 MG tablet Take 1 tablet by mouth daily.  Marland Kitchen nystatin (MYCOSTATIN) 100000 UNIT/ML suspension Take 5 mLs by mouth 4 (four) times daily.  . pantoprazole (PROTONIX) 40 MG tablet TAKE 1 TABLET BY MOUTH EVERY DAY  . polyethylene glycol (MIRALAX / GLYCOLAX) 17 g packet Take 17 g by mouth daily as needed for mild constipation or moderate constipation. (Patient not taking: Reported on 03/08/2020)  . torsemide (DEMADEX) 20 MG tablet Take 1 tablet (20 mg total) by mouth  daily for 7 days.  Marland Kitchen triamcinolone ointment (KENALOG) 0.1 % Apply 1 application topically 2 (two) times daily as needed (itching). (Patient not taking: Reported on 03/08/2020)   No facility-administered medications prior to visit.    Review of Systems  Constitutional: Negative for appetite change, chills and fever.  HENT:       Dark brown mucus production  Respiratory: Negative for chest tightness, shortness of breath and wheezing.   Cardiovascular: Negative for chest pain and palpitations.  Gastrointestinal: Positive for vomiting. Negative for abdominal pain and nausea.  Neurological: Positive for weakness and numbness.      Objective    There were no vitals taken for this visit.    BP 128/62 (BP  Location: Right Arm)  Temp 97.8 F (36.6 C) (Tympanic)  Ht 5\' 10"  (1.778 m)  Wt 214 lb 6.4 oz (97.3 kg)  BMI 30.76 kg/m  BSA 2.19 m     Physical Exam   General: Appearance:    Obese male in no acute distress, generalized weakness.   Eyes:    PERRL, conjunctiva/corneas clear, EOM's intact       Lungs:     Clear to auscultation bilaterally, respirations unlabored  Heart:    Normal heart rate. Irregularly irregular rhythm. No murmurs, rubs, or gallops.   MS:   All extremities are intact.   Neurologic:   Awake, alert, oriented x 3. No apparent focal neurological           defect.         Assessment & Plan     1. Hypocalcemia Acute, profound and symptomatic. Patient referred to ED for further evaluation and likely I.V. calcium replacement  2. Numbness and tingling in both hands/generalized weakness  - EKG 12-Lead  3. Acute kidney injury with hypokalemia Referred directly to ED of IVF management.   4. Atrial fibrillation, unspecified type ([place]) New onset in setting of acute hypocalcemia.   5. Bilous vomiting.  6. History of COVID-19   No follow-ups on file.     The entirety of the information documented in the History of Present Illness, Review of Systems and Physical Exam were personally obtained by me. Portions of this information were initially documented by the CMA and reviewed by me for thoroughness and accuracy.       Lelon Huh, MD  [facility] [phone] (phone) [phone] (fax)  [place]

## 2020-04-19 NOTE — ED Notes (Signed)
CA 6.4-charge RN notified

## 2020-04-19 NOTE — H&P (Signed)
History and Physical    Taylor [name]:[id number] DOB: 1941-08-20 DOA: 04/19/2020  PCP: Birdie Sons, MD  Patient coming from: home  I have personally briefly reviewed patient's old medical records in [place]  Chief Complaint: numbness of arms and legs  HPI: Taylor [name] is a 79 y.o. Caucasian male with medical history significant of CAD and MI s/p CABG and stents, systolic CHF with LVEF 70-48% with ICD, CKD 3b who was told by his PCP to come to the ED due to abnormal electrolyte levels in his lab works.  Pt reported about 2 weeks ago, he started having numbness in both arms and legs, and weakness.  Cramp in his left shoulder once.  Maybe lip quivering once.  No other new symptoms, no fever, dyspnea, chest pain, abdominal pain, N/V/D, dysuria, increased swelling, change in urine output.  No change in diet.  Pt said that he had been taking Lasix for years, but was recently switched to something different that was more expensive (looks to be torsemide), so he switched back to Lasix about a week ago.  Calcium, potassium, magnesium levels were unremarkable about 2 months ago.   ED Course: initial vitals: afebrile, pulse 62, BP 95/57, sating 97% on room air.  Labs notable for K+ 3.0, Ca 6.4 (albumin wnl), mag 0.7, trop 34 and 34, EKG showed paced with QTc 604.  Pt received IV K, mag, Ca in the ED before admission.   Assessment/Plan Active Problems:   Hypocalcemia  # Numbness and weakness due to severe electrolytes de-arrangements # Hypokalemia, hypomag, hypocalcemia  --K+ 3.0, Ca 6.4 (albumin wnl), mag 0.7.  Unclear etiology.  Pt was on Lasix, however, he has taken it for many years.   PLAN: --Replete electrolytes PRN --lab check BID --Hold diuretics for now --Monitor on tele  # QTc prolongation 2/2 electrolytes de-arrangements --Monitor on tele --Hold QTc prolonging meds   # CKD3b --Cr 1.78 on presentation.  About baseline.  # Hx of CAD and MI s/p CABG and  stents --continue home ASA and statin  # systolic CHF with LVEF 88-91% # s/p ICD --stable, not in CHF exacerbation. PLAN: --continue home coreg --Holding home losartan for now since BP low normal --Holding diuretic for now  # Hx of gout --continue home allopurinol  # GERD --Hold home PPI 2/2 QTc prolongation  # Psoriasis, chronic   DVT prophylaxis: Lovenox SQ Code Status: Full code  Family Communication:   Disposition Plan: home  Consults called:  Admission status: Inpatient   Review of Systems: As per HPI otherwise 10 point review of systems negative.   Past Medical History:  Diagnosis Date  . AAA (abdominal aortic aneurysm) [facility]) 06/03/2007   [facility]; Dr. Kellie Simmering  . AICD (automatic cardioverter/defibrillator) present   . Barrett's esophagus   . Bladder cancer ([place])   . Bradycardia   . CAD (coronary artery disease)   . CHF (congestive heart failure) ([place])   . Cluster headache   . DDD (degenerative disc disease), lumbar   . Dyspnea    WITH EXERTION  . Edema    LEFT ANKLE  . Fracture of skull base ([place]) 1997   due to fall  . GERD (gastroesophageal reflux disease)   . Gout   . History of bladder cancer 12/1995  . Hyperlipidemia   . Hypertension   . Malignant melanoma ([place]) 12/2012   right dorsal forearm excised  . Myocardial infarction ([place])    LAST 2014  .  Osteoarthritis of knee   . Pacemaker 10/10/2006  . Pneumonia    2016  . Pre-diabetes   . Psoriasis   . Rib fracture 1997   due to fall  . Sleep apnea    CPAP  . Stroke ([place])   . Venous incompetence     Past Surgical History:  Procedure Laterality Date  . ABDOMINAL AORTIC ANEURYSM REPAIR  06/03/2007   [facility]; Dr. Kellie Simmering  . ANGIOPLASTY  1994   MI  . BLADDER TUMOR EXCISION  12/1995  . CATARACT EXTRACTION W/PHACO Left 10/22/2016   Procedure: CATARACT EXTRACTION PHACO AND INTRAOCULAR LENS PLACEMENT (IOC);  Surgeon: Birder Robson, MD;  Location:  [facility] ORS;  Service: Ophthalmology;  Laterality: Left;  [place] 47.7 AP% 18.4 CDE 8.78 Fluid pack lot # [id number]  . CATARACT EXTRACTION W/PHACO Right 12/10/2016   Procedure: CATARACT EXTRACTION PHACO AND INTRAOCULAR LENS PLACEMENT (IOC);  Surgeon: Birder Robson, MD;  Location: [facility] ORS;  Service: Ophthalmology;  Laterality: Right;  [place] 00:39 AP% 23.3 CDE 9.13 Fluid pack lot # [id number]  . CORONARY ANGIOPLASTY     STENTS X 5  . CORONARY ARTERY BYPASS GRAFT  09/22/2006   four  . ELBOW BURSA SURGERY     DUE TO GOUT  . INSERT / REPLACE / REMOVE PACEMAKER    . MELANOMA EXCISION  12/2012   Right forearm     reports that he quit smoking about 20 years ago. His smoking use included cigarettes. He smoked 1.00 pack per day. He has never used smokeless tobacco. He reports that he does not drink alcohol or use drugs.  Allergies  Allergen Reactions  . Amlodipine Besylate Swelling    Had a reaction when taking with colcrys   . Crestor [Rosuvastatin]     Muscle cramps and pain  . Rocephin [Ceftriaxone]     unknown    Family History  Problem Relation Age of Onset  . Cancer Mother        Melanoma skin cancer  . Heart attack Father 55  . Cancer Father        throat cancer  . Arthritis Brother      Prior to Admission medications   Medication Sig Start Date End Date Taking? Authorizing Provider  allopurinol (ZYLOPRIM) 300 MG tablet TAKE 2 TABLETS(600 MG) BY MOUTH DAILY Patient taking differently: Take 600 mg by mouth daily.  01/04/20  Yes Birdie Sons, MD  aspirin 81 MG EC tablet Take 81 mg by mouth daily.    Yes [provider]  atorvastatin (LIPITOR) 40 MG tablet TAKE 1 TABLET BY MOUTH EVERY DAY Patient taking differently: Take 40 mg by mouth daily.  04/10/20  Yes Birdie Sons, MD  carvedilol (COREG) 12.5 MG tablet TAKE 1 TABLET BY MOUTH DAILY Patient taking differently: Take 12.5 mg by mouth daily.  08/31/19  Yes Birdie Sons, MD  Cyanocobalamin (VITAMIN B-12 CR) 1000 MCG  TBCR Take 1,000 mcg by mouth daily.    Yes [provider]  losartan (COZAAR) 50 MG tablet TAKE 2 TABLETS(100 MG) BY MOUTH DAILY Patient taking differently: Take 50 mg by mouth daily.  07/08/19  Yes Birdie Sons, MD  pantoprazole (PROTONIX) 40 MG tablet TAKE 1 TABLET BY MOUTH EVERY DAY Patient taking differently: Take 40 mg by mouth daily.  04/09/19  Yes Birdie Sons, MD  torsemide (DEMADEX) 20 MG tablet Take 1 tablet (20 mg total) by mouth daily for 7 days. 11/28/19 04/19/20 Yes Alam,  Tawfikul, MD  budesonide-formoterol (SYMBICORT) 160-4.5 MCG/ACT inhaler Inhale 2 puffs into the lungs 2 (two) times daily. Patient not taking: Reported on 03/08/2020 12/06/19   Birdie Sons, MD  COMBIVENT RESPIMAT 20-100 MCG/ACT AERS respimat SMARTSIG:2 Inhalation Via Inhaler 4 Times Daily PRN 12/09/19   [provider]  fluticasone (FLONASE) 50 MCG/ACT nasal spray SHAKE LIQUID AND USE 2 SPRAYS IN [facility] NOSTRIL DAILY Patient not taking: Reported on 04/19/2020 12/12/19   Birdie Sons, MD  glucose blood (ONETOUCH VERIO) test strip Use as instructed to check sugar daily 12/14/19   Birdie Sons, MD  HYDROcodone-homatropine [facility]) 5-1.5 MG/5ML syrup Take 5 mLs by mouth every 8 (eight) hours as needed for cough. Patient not taking: Reported on 04/19/2020 03/08/20   Birdie Sons, MD  indomethacin (INDOCIN) 50 MG capsule Take 1 capsule (50 mg total) by mouth 3 (three) times daily as needed (for gout flares). 01/22/19   Birdie Sons, MD  metolazone (ZAROXOLYN) 5 MG tablet Take 1 tablet by mouth daily. 01/04/20 01/03/21  [provider]  nystatin (MYCOSTATIN) 100000 UNIT/ML suspension Take 5 mLs by mouth 4 (four) times daily. 12/09/19   [provider]  polyethylene glycol (MIRALAX / GLYCOLAX) 17 g packet Take 17 g by mouth daily as needed for mild constipation or moderate constipation. Patient not taking: Reported on 03/08/2020 11/28/19   Thornell Mule, MD  triamcinolone ointment  (KENALOG) 0.1 % Apply 1 application topically 2 (two) times daily as needed (itching). Patient not taking: Reported on 03/08/2020 01/07/20   Birdie Sons, MD    Physical Exam: Vitals:   04/19/20 1105 04/19/20 1347 04/19/20 1404 04/19/20 1500  BP: (!) 95/57  117/78 (!) 126/92  Pulse: 62   73  Resp: 18  17 19   Temp:  97.6 F (36.4 C)    TempSrc:  Oral    SpO2: 97%  100% 99%  Weight:      Height:        Constitutional: NAD, AAOx3 HEENT: conjunctivae and lids normal, EOMI CV: irregular, no M,R,G. Distal pulses +2.  No cyanosis.   RESP: CTA B/L, normal respiratory effort  GI: +BS, NTND Extremities: No effusions, edema, or tenderness in BLE SKIN: warm, dry.  Extensive macular-papular rash all over his arms and legs Neuro: II - XII grossly intact.  Sensation intact Psych: Normal mood and affect.  Appropriate judgement and reason   Labs on Admission: I have personally reviewed following labs and imaging studies  CBC: Recent Labs  Lab 04/17/20 0000 04/19/20 1055  WBC  --  9.0  HGB 13.3* 13.0  HCT 40* 39.3  MCV  --  100.5*  PLT  --  147*   Basic Metabolic Panel: Recent Labs  Lab 04/17/20 0000 04/19/20 1055 04/19/20 1405  NA 144 139  --   K 3.1* 3.0*  --   CL 97* 95*  --   CO2 36* 29  --   GLUCOSE  --  125*  --   BUN 46* 44*  --   CREATININE 2.0* 1.78*  --   CALCIUM 5.8* 6.4*  --   MG  --   --  0.7*   GFR: Estimated Creatinine Clearance: 40.5 mL/min (A) (by C-G formula based on SCr of 1.78 mg/dL (H)). Liver Function Tests: Recent Labs  Lab 04/19/20 1405  AST 26  ALT 13  ALKPHOS 92  BILITOT 1.6*  PROT 8.2*  ALBUMIN 4.0   No results for input(s): LIPASE, AMYLASE in  the last 168 hours. No results for input(s): AMMONIA in the last 168 hours. Coagulation Profile: No results for input(s): INR, PROTIME in the last 168 hours. Cardiac Enzymes: No results for input(s): CKTOTAL, CKMB, CKMBINDEX, TROPONINI in the last 168 hours. BNP (last 3 results) No results  for input(s): PROBNP in the last 8760 hours. HbA1C: No results for input(s): HGBA1C in the last 72 hours. CBG: No results for input(s): GLUCAP in the last 168 hours. Lipid Profile: No results for input(s): CHOL, HDL, LDLCALC, TRIG, CHOLHDL, LDLDIRECT in the last 72 hours. Thyroid Function Tests: No results for input(s): TSH, T4TOTAL, FREET4, T3FREE, THYROIDAB in the last 72 hours. Anemia Panel: No results for input(s): VITAMINB12, FOLATE, FERRITIN, TIBC, IRON, RETICCTPCT in the last 72 hours. Urine analysis:    Component Value Date/Time   COLORURINE YELLOW (A) 11/22/2019 2134   APPEARANCEUR CLEAR (A) 11/22/2019 2134   LABSPEC 1.043 (H) 11/22/2019 2134   PHURINE 6.0 11/22/2019 2134   GLUCOSEU 50 (A) 11/22/2019 2134   HGBUR NEGATIVE 11/22/2019 2134   BILIRUBINUR NEGATIVE 11/22/2019 2134   BILIRUBINUR negative 03/31/2018 [place] 11/22/2019 2134   PROTEINUR 30 (A) 11/22/2019 2134   UROBILINOGEN 0.2 03/31/2018 1452   NITRITE NEGATIVE 11/22/2019 2134   LEUKOCYTESUR NEGATIVE 11/22/2019 2134    Radiological Exams on Admission: No results found.    Enzo Bi MD [facility]  If 7PM-7AM, please contact night-coverage 04/19/2020, 4:11 PM

## 2020-04-19 NOTE — ED Triage Notes (Signed)
Pt reports went to his MD because his hands were going to sleep, they did blood work and called him and told him to come to the ED for low K+ and something else that he could not remember

## 2020-04-20 LAB — BASIC METABOLIC PANEL
Anion gap: 12 (ref 5–15)
Anion gap: 10 (ref 5–15)
BUN: 35 mg/dL — ABNORMAL HIGH (ref 8–23)
BUN: 30 mg/dL — ABNORMAL HIGH (ref 8–23)
CO2: 29 mmol/L (ref 22–32)
CO2: 28 mmol/L (ref 22–32)
Calcium: 7.1 mg/dL — ABNORMAL LOW (ref 8.9–10.3)
Calcium: 7.5 mg/dL — ABNORMAL LOW (ref 8.9–10.3)
Chloride: 100 mmol/L (ref 98–111)
Chloride: 102 mmol/L (ref 98–111)
Creatinine, Ser: 1.45 mg/dL — ABNORMAL HIGH (ref 0.61–1.24)
Creatinine, Ser: 1.37 mg/dL — ABNORMAL HIGH (ref 0.61–1.24)
GFR calc Af Amer: 53 mL/min — ABNORMAL LOW (ref >60)
GFR calc Af Amer: 57 mL/min — ABNORMAL LOW (ref >60)
GFR calc non Af Amer: 46 mL/min — ABNORMAL LOW (ref >60)
GFR calc non Af Amer: 49 mL/min — ABNORMAL LOW (ref >60)
Glucose, Bld: 121 mg/dL — ABNORMAL HIGH (ref 70–99)
Glucose, Bld: 164 mg/dL — ABNORMAL HIGH (ref 70–99)
Potassium: 3.4 mmol/L — ABNORMAL LOW (ref 3.5–5.1)
Potassium: 3.5 mmol/L (ref 3.5–5.1)
Sodium: 141 mmol/L (ref 135–145)
Sodium: 140 mmol/L (ref 135–145)

## 2020-04-20 LAB — CBC
HCT: 38.2 % — ABNORMAL LOW (ref 39.0–52.0)
Hemoglobin: 12.6 g/dL — ABNORMAL LOW (ref 13.0–17.0)
MCH: 33.2 pg (ref 26.0–34.0)
MCHC: 33 g/dL (ref 30.0–36.0)
MCV: 100.5 fL — ABNORMAL HIGH (ref 80.0–100.0)
Platelets: 135 10*3/uL — ABNORMAL LOW (ref 150–400)
RBC: 3.8 MIL/uL — ABNORMAL LOW (ref 4.22–5.81)
RDW: 15.7 % — ABNORMAL HIGH (ref 11.5–15.5)
WBC: 7.4 10*3/uL (ref 4.0–10.5)
nRBC: 0 % (ref 0.0–0.2)

## 2020-04-20 LAB — MAGNESIUM
Magnesium: 2 mg/dL (ref 1.7–2.4)
Magnesium: 2 mg/dL (ref 1.7–2.4)

## 2020-04-20 LAB — CALCIUM, IONIZED: Calcium, Ionized, Serum: 3.5 mg/dL — ABNORMAL LOW (ref 4.5–5.6)

## 2020-04-20 MED ORDER — CALCIUM GLUCONATE-NACL 2-0.675 GM/100ML-% IV SOLN
2.0000 g | Freq: Once | INTRAVENOUS | Status: AC
  Administered 2020-04-20: 2000 mg via INTRAVENOUS
  Filled 2020-04-20: qty 100

## 2020-04-20 MED ORDER — POTASSIUM CHLORIDE CRYS ER 20 MEQ PO TBCR
40.0000 meq | EXTENDED_RELEASE_TABLET | Freq: Once | ORAL | Status: AC
  Administered 2020-04-20: 40 meq via ORAL
  Filled 2020-04-20: qty 2

## 2020-04-20 MED ORDER — LOSARTAN POTASSIUM 50 MG PO TABS: 50.0000 mg | ORAL_TABLET | Freq: Every day | ORAL | Status: DC

## 2020-04-20 MED ORDER — CALCIUM GLUCONATE-NACL 1-0.675 GM/50ML-% IV SOLN
1.0000 g | Freq: Once | INTRAVENOUS | Status: AC
  Administered 2020-04-20: 1000 mg via INTRAVENOUS
  Filled 2020-04-20: qty 50

## 2020-04-20 NOTE — ED Notes (Signed)
Copy of code 73 sing by pt placed with documents and given to unit secretary.

## 2020-04-20 NOTE — ED Notes (Signed)
Code 44 documents given to this RN by [employer], Opal Sidles and provided to pt.

## 2020-04-20 NOTE — Care Management Obs Status (Signed)
[place] NOTIFICATION   Patient Details  Name: Taylor [name] MRN: [id number] Date of Birth: 1941-06-30   Medicare Observation Status Notification Given:  Yes    Sherie Don, LCSW 04/20/2020, 5:44 PM

## 2020-04-20 NOTE — Discharge Summary (Signed)
Physician Discharge Summary   Taylor [name]  male DOB: [birth year]  LNL:[id number]  PCP: Birdie Sons, MD  Admit date: 04/19/2020 Discharge date: 04/20/2020  Admitted From: home Disposition:  home CODE STATUS: Full code  Discharge Instructions    Discharge instructions   Complete by: As directed    Your potassium, magnesium and calcium levels were low.  We have replenished them for you.    Please follow up with your PCP or cardiologist within 1 week to check your potassium, magnesium and calcium levels.  Please hold your diuretic (Lasix) until you follow up with your PCP or cardiologist.  Since we are holding your Lasix, please don't drink too much fluids, just drink fluids when you are thirsty.   Dr. Enzo Bi [facility] Course:  For full details, please see H&P, progress notes, consult notes and ancillary notes.  Briefly,  Taylor [name] is a 79 y.o. Caucasian male with medical history significant of CAD and MI s/p CABG and stents, systolic CHF with LVEF 40-81% with ICD, CKD 3b who was told by his PCP to come to the ED due to abnormal electrolyte levels in his lab works.  # Numbness and weakness due to severe electrolytes de-arrangements # Hypokalemia, hypomag, hypocalcemia  K+ 3.0, Ca 6.4 (albumin wnl), mag 0.7 on presentation.  Unclear etiology.  Pt said that he had been taking Lasix for years, but was recently switched to something different that was more expensive (looks to be torsemide), so he switched back to Lasix about a week ago.  Calcium, potassium, magnesium levels were unremarkable about 2 months ago.  Diuretic was held, and pt was aggressively repleted with IV Ca, IV mag, and PO potassium.  K+ and mag were normalized prior to discharge.  Ca was 7.5 on the day of discharge, so pt received 2 more grams of IV Calcium gluconate before discharge.  Pt was offered to stay another night to ensure stability of electrolytes, however, he wanted to go home and agreed  to get lab checks with his PCP ~2 days after discharge.  # QTc prolongation 2/2 electrolytes de-arrangements Held QTc prolonging meds while inpatient.  # AKI  # on CKD3b Cr 1.78 on presentation.  Likely due to dehydration from diuretic.  Cr improved with MIVF.  Prior to discharge, Cr improved to 1.37.    # Hx of CAD and MI s/p CABG and stents continued home ASA and statin  # Systolic CHF with LVEF 44-81% # s/p ICD Stable, not in CHF exacerbation.  Continued home coreg.  Home losartan was held during hospitalization and resumed after discharge.  Diuretic was held pending outpatient cardiology or PCP followup.  # Hx of gout continued home allopurinol  # GERD Held home PPI 2/2 QTc prolongation during hospitalization.  Resumed at discharge after electrolytes normalized.    # Psoriasis, chronic   Discharge Diagnoses:  Active Problems:   Hypocalcemia    Discharge Instructions:  Allergies as of 04/20/2020      Reactions   Amlodipine Besylate Swelling   Had a reaction when taking with colcrys    Crestor [rosuvastatin]    Muscle cramps and pain   Rocephin [ceftriaxone]    unknown      Medication List    STOP taking these medications   budesonide-formoterol 160-4.5 MCG/ACT inhaler Commonly known as: SYMBICORT   fluticasone 50 MCG/ACT nasal spray Commonly known as: FLONASE   HYDROcodone-homatropine 5-1.5 MG/5ML syrup  Commonly known as: HYCODAN   metolazone 5 MG tablet Commonly known as: ZAROXOLYN   nystatin 100000 UNIT/ML suspension Commonly known as: MYCOSTATIN   polyethylene glycol 17 g packet Commonly known as: MIRALAX / GLYCOLAX   torsemide 20 MG tablet Commonly known as: DEMADEX   triamcinolone ointment 0.1 % Commonly known as: KENALOG     TAKE these medications   allopurinol 300 MG tablet Commonly known as: ZYLOPRIM TAKE 2 TABLETS(600 MG) BY MOUTH DAILY What changed: See the new instructions.   aspirin 81 MG EC tablet Take 81 mg by mouth  daily.   atorvastatin 40 MG tablet Commonly known as: LIPITOR TAKE 1 TABLET BY MOUTH EVERY DAY   carvedilol 12.5 MG tablet Commonly known as: COREG TAKE 1 TABLET BY MOUTH DAILY   Combivent Respimat 20-100 MCG/ACT Aers respimat Generic drug: Ipratropium-Albuterol SMARTSIG:2 Inhalation Via Inhaler 4 Times Daily PRN   indomethacin 50 MG capsule Commonly known as: INDOCIN Take 1 capsule (50 mg total) by mouth 3 (three) times daily as needed (for gout flares).   losartan 50 MG tablet Commonly known as: COZAAR Take 1 tablet (50 mg total) by mouth daily. What changed: See the new instructions.   OneTouch Verio test strip Generic drug: glucose blood Use as instructed to check sugar daily   pantoprazole 40 MG tablet Commonly known as: PROTONIX TAKE 1 TABLET BY MOUTH EVERY DAY   Vitamin B-12 CR 1000 MCG Tbcr Take 1,000 mcg by mouth daily.       Follow-up Information    Birdie Sons, MD. Schedule an appointment as soon as possible for a visit in 1 week(s).   Specialty: Family Medicine Why: Need to check your potassium, magnesium and calcium levels. Contact information: [address]  [place] [address] [phone]           Allergies  Allergen Reactions  . Amlodipine Besylate Swelling    Had a reaction when taking with colcrys   . Crestor [Rosuvastatin]     Muscle cramps and pain  . Rocephin [Ceftriaxone]     unknown     The results of significant diagnostics from this hospitalization (including imaging, microbiology, ancillary and laboratory) are listed below for reference.   Consultations:   Procedures/Studies: No results found.    Labs: BNP (last 3 results) Recent Labs    11/12/19 1934 01/03/20 1114 02/09/20 1054  BNP 102.0* 500.0* 73.7   Basic Metabolic Panel: Recent Labs  Lab 04/17/20 0000 04/19/20 1055 04/19/20 1405 04/19/20 1616 04/20/20 0716 04/20/20 1422  NA 144 139  --   --  141 140  K 3.1* 3.0*  --   --   3.4* 3.5  CL 97* 95*  --   --  100 102  CO2 36* 29  --   --  29 28  GLUCOSE  --  125*  --   --  121* 164*  BUN 46* 44*  --   --  35* 30*  CREATININE 2.0* 1.78*  --   --  1.45* 1.37*  CALCIUM 5.8* 6.4*  --   --  7.1* 7.5*  MG  --   --  0.7*  --  2.0 2.0  PHOS  --   --   --  4.1  --   --    Liver Function Tests: Recent Labs  Lab 04/19/20 1405  AST 26  ALT 13  ALKPHOS 92  BILITOT 1.6*  PROT 8.2*  ALBUMIN 4.0   No results for  input(s): LIPASE, AMYLASE in the last 168 hours. No results for input(s): AMMONIA in the last 168 hours. CBC: Recent Labs  Lab 04/17/20 0000 04/19/20 1055 04/20/20 0716  WBC  --  9.0 7.4  HGB 13.3* 13.0 12.6*  HCT 40* 39.3 38.2*  MCV  --  100.5* 100.5*  PLT  --  134* 135*   Cardiac Enzymes: No results for input(s): CKTOTAL, CKMB, CKMBINDEX, TROPONINI in the last 168 hours. BNP: Invalid input(s): POCBNP CBG: No results for input(s): GLUCAP in the last 168 hours. D-Dimer No results for input(s): DDIMER in the last 72 hours. Hgb A1c No results for input(s): HGBA1C in the last 72 hours. Lipid Profile No results for input(s): CHOL, HDL, LDLCALC, TRIG, CHOLHDL, LDLDIRECT in the last 72 hours. Thyroid function studies No results for input(s): TSH, T4TOTAL, T3FREE, THYROIDAB in the last 72 hours.  Invalid input(s): FREET3 Anemia work up No results for input(s): VITAMINB12, FOLATE, FERRITIN, TIBC, IRON, RETICCTPCT in the last 72 hours. Urinalysis    Component Value Date/Time   COLORURINE YELLOW (A) 11/22/2019 2134   APPEARANCEUR CLEAR (A) 11/22/2019 2134   LABSPEC 1.043 (H) 11/22/2019 2134   PHURINE 6.0 11/22/2019 2134   GLUCOSEU 50 (A) 11/22/2019 2134   HGBUR NEGATIVE 11/22/2019 2134   BILIRUBINUR NEGATIVE 11/22/2019 2134   BILIRUBINUR negative 03/31/2018 1452   [place] 11/22/2019 2134   PROTEINUR 30 (A) 11/22/2019 2134   UROBILINOGEN 0.2 03/31/2018 1452   NITRITE NEGATIVE 11/22/2019 2134   LEUKOCYTESUR NEGATIVE 11/22/2019 2134    Sepsis Labs Invalid input(s): PROCALCITONIN,  WBC,  LACTICIDVEN Microbiology Recent Results (from the past 240 hour(s))  SARS Coronavirus 2 by RT PCR (hospital order, performed in [place] hospital lab) Nasopharyngeal Nasopharyngeal Swab     Status: None   Collection Time: 04/19/20  3:16 PM   Specimen: Nasopharyngeal Swab  Result Value Ref Range Status   SARS Coronavirus 2 NEGATIVE NEGATIVE Final    Comment: (NOTE) SARS-CoV-2 target nucleic acids are NOT DETECTED. The SARS-CoV-2 RNA is generally detectable in upper and lower respiratory specimens during the acute phase of infection. The lowest concentration of SARS-CoV-2 viral copies this assay can detect is 250 copies / mL. A negative result does not preclude SARS-CoV-2 infection and should not be used as the sole basis for treatment or other patient management decisions.  A negative result may occur with improper specimen collection / handling, submission of specimen other than nasopharyngeal swab, presence of viral mutation(s) within the areas targeted by this assay, and inadequate number of viral copies (<250 copies / mL). A negative result must be combined with clinical observations, patient history, and epidemiological information. Fact Sheet for Patients:   StrictlyIdeas.no Fact Sheet for Healthcare Providers: BankingDealers.co.za This test is not yet approved or cleared  by the [place] FDA and has been authorized for detection and/or diagnosis of SARS-CoV-2 by FDA under an Emergency Use Authorization (EUA).  This EUA will remain in effect (meaning this test can be used) for the duration of the COVID-19 declaration under Section 564(b)(1) of the Act, 21 U.S.C. section 360bbb-3(b)(1), unless the authorization is terminated or revoked sooner. Performed at [facility], [address] [place], [place] [address]      Total time spend on discharging this  patient, including the last patient exam, discussing the hospital stay, instructions for ongoing care as it relates to all pertinent caregivers, as well as preparing the medical discharge records, prescriptions, and/or referrals as applicable, is 50 minutes.  Enzo Bi, MD  [facility] 04/20/2020, 5:17 PM  If 7PM-7AM, please contact night-coverage

## 2020-04-20 NOTE — ED Notes (Signed)
This RN spoke with Eulas Post SW to confirm code 58 and process documents.

## 2020-04-20 NOTE — Care Management CC44 (Signed)
Condition Code 44 Documentation Completed  Patient Details  Name: Taylor [name] MRN: [id number] Date of Birth: 07/22/1941   Condition Code 44 given:  Yes Patient signature on Condition Code 44 notice:  Yes Documentation of 2 MD's agreement:  Yes Code 44 added to claim:  Yes    Sherie Don, LCSW 04/20/2020, 5:44 PM

## 2020-04-21 ENCOUNTER — Telehealth: Payer: Self-pay

## 2020-04-21 DIAGNOSIS — E876 Hypokalemia: Secondary | ICD-10-CM

## 2020-04-21 NOTE — Telephone Encounter (Signed)
Copied from [place] [phone]. Topic: Appointment Scheduling - Scheduling Inquiry for Clinic >> Apr 21, 2020  9:08 AM Lennox Solders wrote: Reason for CRM: pt went to er last night and per pt discharge summary said to see dr [name] Section in 2 days. Pt said he needs potassium recheck. Pt request to see  dr [name] Section

## 2020-04-21 NOTE — Telephone Encounter (Signed)
Sorry, I meant the 11:20 slot on Wednesday for hospital follow up.

## 2020-04-21 NOTE — Telephone Encounter (Signed)
Suggest he have labs drawn on Tuesday. Needs renal panel of hypokalemia.  Results should be back Wednesday. He can have the 1pm slot on Wednesday for the hospital follow up.

## 2020-04-21 NOTE — Telephone Encounter (Signed)
Patient advised. Lab order placed. There is no 1pm slot on Wednesday since you are only here in the mornings, so should I have Arbie Cookey add a slot at 1pm for this patient?

## 2020-04-25 DIAGNOSIS — E876 Hypokalemia: Secondary | ICD-10-CM | POA: Diagnosis not present

## 2020-04-26 ENCOUNTER — Encounter: Payer: Self-pay | Admitting: Family Medicine

## 2020-04-26 ENCOUNTER — Other Ambulatory Visit: Payer: Self-pay

## 2020-04-26 ENCOUNTER — Ambulatory Visit (INDEPENDENT_AMBULATORY_CARE_PROVIDER_SITE_OTHER): Payer: Medicare Other | Admitting: Family Medicine

## 2020-04-26 DIAGNOSIS — I5020 Unspecified systolic (congestive) heart failure: Secondary | ICD-10-CM | POA: Diagnosis not present

## 2020-04-26 LAB — RENAL FUNCTION PANEL
Albumin: 3.7 g/dL (ref 3.7–4.7)
BUN/Creatinine Ratio: 15 (ref 10–24)
BUN: 20 mg/dL (ref 8–27)
CO2: 22 mmol/L (ref 20–29)
Calcium: 8.8 mg/dL (ref 8.6–10.2)
Chloride: 107 mmol/L — ABNORMAL HIGH (ref 96–106)
Creatinine, Ser: 1.32 mg/dL — ABNORMAL HIGH (ref 0.76–1.27)
GFR calc Af Amer: 60 mL/min/{1.73_m2} (ref >59)
GFR calc non Af Amer: 52 mL/min/{1.73_m2} — ABNORMAL LOW (ref >59)
Glucose: 134 mg/dL — ABNORMAL HIGH (ref 65–99)
Phosphorus: 2.9 mg/dL (ref 2.8–4.1)
Potassium: 4.3 mmol/L (ref 3.5–5.2)
Sodium: 146 mmol/L — ABNORMAL HIGH (ref 134–144)

## 2020-04-26 NOTE — Progress Notes (Signed)
Established patient visit   Patient: Taylor [name]   DOB: 1941-02-23   79 y.o. Male  MRN: [id number] Visit Date: 04/26/2020  Today's healthcare provider: Lelon Huh, MD   No chief complaint on file.  Subjective    HPI Follow up Hospitalization  Patient was admitted to [facility] on 04/19/2020 and discharged on 04/20/2020. He was treated for hypocalcemia, Hypokalemia, prolonged Q-T interval on EKG and paresthesis.   Treatment for this included IV Calcium, K, and Mag replacement. During discharge patient was advised to follow up in 1 week with PCP to check potassium, magnesium and calcium levels. He had all diuretics discontinued. He was on torsemide 20mg  twice a day. He had also previously prescribed metolazone, but he had stopped it due to expense. He did not bring his medications today, but he did bring his medication list and only diuretic is torsemide.   Telephone follow up was not done. He reports good compliance with treatment. He was having numbness and tingling of extremities and generalized weakness prior to calcium replenishment, which he states has resolve. However,  He reports that the shortness of breath has worsened since being seen in the hospital. He has had persistent had chronic persistent cough since having cough, but states it has been more productive of yellow mucous the last couple of days. He states he did have fever on day of hospital discharge, but no fevers or chills since then. Follow up labs yesterday were as follows.  Last metabolic panel Lab Results  Component Value Date   GLUCOSE 134 (H) 04/25/2020   NA 146 (H) 04/25/2020   K 4.3 04/25/2020   CL 107 (H) 04/25/2020   CO2 22 04/25/2020   BUN 20 04/25/2020   CREATININE 1.32 (H) 04/25/2020   GFRNONAA 52 (L) 04/25/2020   GFRAA 60 04/25/2020   CALCIUM 8.8 04/25/2020   PHOS 2.9 04/25/2020   PROT 8.2 (H) 04/19/2020   ALBUMIN 3.7 04/25/2020   LABGLOB 2.9 02/09/2020   AGRATIO 1.4 02/09/2020   BILITOT 1.6  (H) 04/19/2020   ALKPHOS 92 04/19/2020   AST 26 04/19/2020   ALT 13 04/19/2020   ANIONGAP 10 04/20/2020    ----------------------------------------------------------------------------------------- -     Medications: Outpatient Medications Prior to Visit  Medication Sig  . allopurinol (ZYLOPRIM) 300 MG tablet TAKE 2 TABLETS(600 MG) BY MOUTH DAILY (Patient taking differently: Take 600 mg by mouth daily. )  . aspirin 81 MG EC tablet Take 81 mg by mouth daily.   Marland Kitchen atorvastatin (LIPITOR) 40 MG tablet TAKE 1 TABLET BY MOUTH EVERY DAY (Patient taking differently: Take 40 mg by mouth daily. )  . carvedilol (COREG) 12.5 MG tablet TAKE 1 TABLET BY MOUTH DAILY (Patient taking differently: Take 12.5 mg by mouth daily. )  . COMBIVENT RESPIMAT 20-100 MCG/ACT AERS respimat SMARTSIG:2 Inhalation Via Inhaler 4 Times Daily PRN  . Cyanocobalamin (VITAMIN B-12 CR) 1000 MCG TBCR Take 1,000 mcg by mouth daily.   Marland Kitchen glucose blood (ONETOUCH VERIO) test strip Use as instructed to check sugar daily  . indomethacin (INDOCIN) 50 MG capsule Take 1 capsule (50 mg total) by mouth 3 (three) times daily as needed (for gout flares).  . losartan (COZAAR) 50 MG tablet Take 1 tablet (50 mg total) by mouth daily.  . pantoprazole (PROTONIX) 40 MG tablet TAKE 1 TABLET BY MOUTH EVERY DAY (Patient taking differently: Take 40 mg by mouth daily. )   No facility-administered medications prior to visit.    Review of  Systems  Constitutional: Negative for appetite change, chills and fever.  HENT: Positive for rhinorrhea.   Respiratory: Positive for shortness of breath. Negative for chest tightness and wheezing.   Cardiovascular: Negative for chest pain and palpitations.  Gastrointestinal: Negative for abdominal pain, nausea and vomiting.  Endocrine: Positive for cold intolerance.     Objective    BP 138/82   Pulse 63   Temp (!) 97.3 F (36.3 C)   Resp (!) 28   Wt 220 lb (99.8 kg)   SpO2 95% Comment: room air  BMI 31.57  kg/m   Physical Exam   General: Appearance:    Obese male in no acute distress  Eyes:    PERRL, conjunctiva/corneas clear, EOM's intact       Lungs:     Clear to auscultation bilaterally, respirations unlabored  Heart:    Normal heart rate. Regular rhythm. No murmurs, rubs, or gallops.   MS:   All extremities are intact.   Neurologic:   Awake, alert, oriented x 3. No apparent focal neurological           defect.        No results found for any visits on 04/26/20.  Assessment & Plan     1. Hypocalcemia Possibly secondary to chronic loop diuretics. Corrected with IV calcium last week. He will resume torsemide for CHF as below. Will check PTH at follow up. Start OTC calcium replacement twice a day.   2. Hypomagnesemia Possibly secondary to diuretics which are being restarted today. He is to start OTC magnesium supplementation.   3. Systolic CHF with reduced left ventricular function, NYHA class 3 (HCC) Symptomatically much worse since stopping torsemide at recent hospitalization. He is to restart one torsemide daily and consider titrating up to two a day.    No follow-ups on file.         Lelon Huh, MD  [facility] [phone] (phone) [phone] (fax)  [place]

## 2020-04-28 ENCOUNTER — Telehealth: Payer: Self-pay | Admitting: Family Medicine

## 2020-04-28 DIAGNOSIS — J9620 Acute and chronic respiratory failure, unspecified whether with hypoxia or hypercapnia: Secondary | ICD-10-CM | POA: Diagnosis not present

## 2020-04-28 NOTE — Telephone Encounter (Signed)
Patient was seen Wednesday and was to restart torsemide for swelling and shortness of breath. Please see if this is improving or not.  Also his magnesium levels came back and they are very low so it's critical that he is taking the magnesium oxide 500mg  twice a day. We need to recheck the calcium and magnesium levels next Wednesday or Thursday. Have place future order.

## 2020-04-28 NOTE — Telephone Encounter (Signed)
Pt. Returned call. Given Dr. Maralyn Sago message. States the torsemide is helping. Will return to the lab Wednesday or Thursday for repeat lab work.

## 2020-04-28 NOTE — Telephone Encounter (Signed)
Tried calling patient. Left message to call back. OK for [facility] triage to advise and discuss message with patient.

## 2020-04-28 NOTE — Telephone Encounter (Signed)
FYI

## 2020-05-01 ENCOUNTER — Encounter: Payer: Self-pay | Admitting: Family Medicine

## 2020-05-01 ENCOUNTER — Ambulatory Visit (INDEPENDENT_AMBULATORY_CARE_PROVIDER_SITE_OTHER): Payer: Medicare Other | Admitting: Family Medicine

## 2020-05-01 ENCOUNTER — Other Ambulatory Visit: Payer: Self-pay

## 2020-05-01 ENCOUNTER — Ambulatory Visit: Payer: Self-pay

## 2020-05-01 VITALS — BP 143/82 | HR 78 | Temp 97.1°F | Resp 32 | Wt 223.0 lb

## 2020-05-01 DIAGNOSIS — M25472 Effusion, left ankle: Secondary | ICD-10-CM | POA: Diagnosis not present

## 2020-05-01 DIAGNOSIS — E790 Hyperuricemia without signs of inflammatory arthritis and tophaceous disease: Secondary | ICD-10-CM

## 2020-05-01 DIAGNOSIS — I10 Essential (primary) hypertension: Secondary | ICD-10-CM | POA: Diagnosis not present

## 2020-05-01 DIAGNOSIS — R0602 Shortness of breath: Secondary | ICD-10-CM

## 2020-05-01 DIAGNOSIS — M25471 Effusion, right ankle: Secondary | ICD-10-CM

## 2020-05-01 MED ORDER — HYDROCHLOROTHIAZIDE 25 MG PO TABS: 25.0000 mg | ORAL_TABLET | Freq: Every day | ORAL | 3 refills | Status: DC

## 2020-05-01 NOTE — Patient Instructions (Addendum)
.   Go ahead and take a dose of indomethacin today in case this is related gout  . Take an extra dose of torsemide today, and today only   Starting tomorrow morning, start taking hydrochlorothiazide 25mg  once a day. This helps control your blood pressure, gets rid of fluid, and helps keep you calcium levels up   Go to the lab on Wednesday

## 2020-05-01 NOTE — Telephone Encounter (Signed)
Pt. Reports torsemide was heling his breathing and swelling in ankles last week, but over the weekend began having increased shortness of breath and swelling in ankles. Using his  O2 at night. Went to the gym this morning, and states "I could hardly do anything." Denies chest pain. Request office visit. Appointment made. Instructed if symptoms worsen, go to ED. Verbalizes understanding.  Reason for Disposition  [1] Longstanding difficulty breathing (e.g., CHF, COPD, emphysema) AND [2] WORSE than normal  Answer Assessment - Initial Assessment Questions 1. RESPIRATORY STATUS: "Describe your breathing?" (e.g., wheezing, shortness of breath, unable to speak, severe coughing)      Shortness of breath 2. ONSET: "When did this breathing problem begin?"      This weekend 3. PATTERN "Does the difficult breathing come and go, or has it been constant since it started?"      Constant 4. SEVERITY: "How bad is your breathing?" (e.g., mild, moderate, severe)    - MILD: No SOB at rest, mild SOB with walking, speaks normally in sentences, can lay down, no retractions, pulse < 100.    - MODERATE: SOB at rest, SOB with minimal exertion and prefers to sit, cannot lie down flat, speaks in phrases, mild retractions, audible wheezing, pulse 100-120.    - SEVERE: Very SOB at rest, speaks in single words, struggling to breathe, sitting hunched forward, retractions, pulse > 120     Moderate 5. RECURRENT SYMPTOM: "Have you had difficulty breathing before?" If so, ask: "When was the last time?" and "What happened that time?"      Yes 6. CARDIAC HISTORY: "Do you have any history of heart disease?" (e.g., heart attack, angina, bypass surgery, angioplasty)      Yes 7. LUNG HISTORY: "Do you have any history of lung disease?"  (e.g., pulmonary embolus, asthma, emphysema)     Yes 8. CAUSE: "What do you think is causing the breathing problem?"      Medicine 9. OTHER SYMPTOMS: "Do you have any other symptoms? (e.g.,  dizziness, runny nose, cough, chest pain, fever)     Wheezing 10. PREGNANCY: "Is there any chance you are pregnant?" "When was your last menstrual period?"       N/A 11. TRAVEL: "Have you traveled out of the country in the last month?" (e.g., travel history, exposures)       No  Protocols used: BREATHING DIFFICULTY-A-AH

## 2020-05-01 NOTE — Progress Notes (Signed)
Established patient visit   Patient: Taylor [name]   DOB: 22-Jul-1941   79 y.o. Male  MRN: [id number] Visit Date: 05/01/2020  Today's healthcare provider: Lelon Huh, MD   No chief complaint on file.  Subjective    Shortness of Breath This is a recurrent problem. The problem occurs daily (he was last seen for this problem 5 days ago and was restarted on Toresmide to help with swelling and shortness of breath). The problem has been gradually worsening (Reports torsemide was helping his breathing and ankle edema last week, but over the weekend began having increased shortness of breath and swelling in ankles. He's using his O2 at night. Went to the gym this morning, and could hardly do anything). Associated symptoms include leg swelling. Pertinent negatives include no abdominal pain, chest pain, fever, vomiting or wheezing.  He states swelling is painful and is in both ankles.   He does have complicated history including post-covid syndrome, CHF and gout. He has been on allopurinol in the past and has prescription for indomethacin at home, but states he has not taken them for several months since gout had improved.       Medications: Outpatient Medications Prior to Visit  Medication Sig  . allopurinol (ZYLOPRIM) 300 MG tablet TAKE 2 TABLETS(600 MG) BY MOUTH DAILY (Patient taking differently: Take 600 mg by mouth daily. )  . aspirin 81 MG EC tablet Take 81 mg by mouth daily.   Marland Kitchen atorvastatin (LIPITOR) 40 MG tablet TAKE 1 TABLET BY MOUTH EVERY DAY (Patient taking differently: Take 40 mg by mouth daily. )  . carvedilol (COREG) 12.5 MG tablet TAKE 1 TABLET BY MOUTH DAILY (Patient taking differently: Take 12.5 mg by mouth daily. )  . COMBIVENT RESPIMAT 20-100 MCG/ACT AERS respimat SMARTSIG:2 Inhalation Via Inhaler 4 Times Daily PRN  . Cyanocobalamin (VITAMIN B-12 CR) 1000 MCG TBCR Take 1,000 mcg by mouth daily.   Marland Kitchen GLUCOSAMINE HCL PO Take 1,350 mg by mouth daily.  Marland Kitchen glucose blood  (ONETOUCH VERIO) test strip Use as instructed to check sugar daily  . indomethacin (INDOCIN) 50 MG capsule Take 1 capsule (50 mg total) by mouth 3 (three) times daily as needed (for gout flares).  . losartan (COZAAR) 50 MG tablet Take 1 tablet (50 mg total) by mouth daily.  . pantoprazole (PROTONIX) 40 MG tablet TAKE 1 TABLET BY MOUTH EVERY DAY (Patient taking differently: Take 40 mg by mouth daily. )  . torsemide (DEMADEX) 20 MG tablet Take 20 mg by mouth daily.   No facility-administered medications prior to visit.    Review of Systems  Constitutional: Negative for appetite change, chills and fever.  Respiratory: Positive for shortness of breath. Negative for chest tightness and wheezing.   Cardiovascular: Positive for leg swelling. Negative for chest pain and palpitations.  Gastrointestinal: Negative for abdominal pain, nausea and vomiting.      Objective    BP (!) 143/82 (BP Location: Right Arm, Cuff Size: Large)   Pulse 78   Temp (!) 97.1 F (36.2 C) (Temporal)   Resp (!) 32 Comment: labored  Wt 223 lb (101.2 kg)   SpO2 93% Comment: room air  BMI 32.00 kg/m    Physical Exam   General: Appearance:    Obese male in no acute distress. Initially with labored breathing, but improved after sitting and resting a few minutes.   Eyes:    PERRL, conjunctiva/corneas clear, EOM's intact       Lungs:  Clear to auscultation bilaterally, respirations unlabored  Heart:    Normal heart rate. Regular rhythm. No murmurs, rubs, or gallops.   MS:   All extremities are intact. 2+ ankle edema, not warm to the touch or inflamed.   Neurologic:   Awake, alert, oriented x 3. No apparent focal neurological           defect.        EKG SR, no acute changes   Assessment & Plan     1. Shortness of breath  - EKG 12-Lead  2. Ankle edema, bilateral   3. Hyperuricemia  - Uric acid; Future  4. Essential (primary) hypertension  - EKG 12-Lead - hydrochlorothiazide (HYDRODIURIL) 25 MG  tablet; Take 1 tablet (25 mg total) by mouth daily.  Dispense: 90 tablet; Refill: 3  Shortness of breath and swelling had initially dramatically improved when torsemide was restarted. His weight is now up just a few pounds since last week.  Unclear if he starting to accumulate fluid or if he is having gout in his ankles. He already has indomethacin at home and can take one dose of these to see if it helps. Can take an extra dose of torsemide today, then start hctz tomorrow which should help with BP and sustain calcium. He is scheduled to have calcium levels checked on 6-9 and will add a uric acid level.   No follow-ups on file.      The entirety of the information documented in the History of Present Illness, Review of Systems and Physical Exam were personally obtained by me. Portions of this information were initially documented by the CMA and reviewed by me for thoroughness and accuracy.      Lelon Huh, MD  [facility] [phone] (phone) [phone] (fax)  [place]

## 2020-05-02 ENCOUNTER — Other Ambulatory Visit: Payer: Self-pay | Admitting: Family Medicine

## 2020-05-02 DIAGNOSIS — E790 Hyperuricemia without signs of inflammatory arthritis and tophaceous disease: Secondary | ICD-10-CM

## 2020-05-03 DIAGNOSIS — G4730 Sleep apnea, unspecified: Secondary | ICD-10-CM | POA: Diagnosis not present

## 2020-05-03 DIAGNOSIS — E790 Hyperuricemia without signs of inflammatory arthritis and tophaceous disease: Secondary | ICD-10-CM | POA: Diagnosis not present

## 2020-05-04 ENCOUNTER — Telehealth: Payer: Self-pay

## 2020-05-04 LAB — RENAL FUNCTION PANEL
Albumin: 3.8 g/dL (ref 3.7–4.7)
BUN/Creatinine Ratio: 15 (ref 10–24)
BUN: 30 mg/dL — ABNORMAL HIGH (ref 8–27)
CO2: 24 mmol/L (ref 20–29)
Calcium: 9 mg/dL (ref 8.6–10.2)
Chloride: 106 mmol/L (ref 96–106)
Creatinine, Ser: 2.05 mg/dL — ABNORMAL HIGH (ref 0.76–1.27)
GFR calc Af Amer: 35 mL/min/{1.73_m2} — ABNORMAL LOW (ref >59)
GFR calc non Af Amer: 30 mL/min/{1.73_m2} — ABNORMAL LOW (ref >59)
Glucose: 188 mg/dL — ABNORMAL HIGH (ref 65–99)
Phosphorus: 3.9 mg/dL (ref 2.8–4.1)
Potassium: 4.5 mmol/L (ref 3.5–5.2)
Sodium: 144 mmol/L (ref 134–144)

## 2020-05-04 LAB — CALCIUM, IONIZED: Calcium, Ion: 4.9 mg/dL (ref 4.5–5.6)

## 2020-05-04 LAB — URIC ACID: Uric Acid: 4.4 mg/dL (ref 3.8–8.4)

## 2020-05-04 LAB — MAGNESIUM: Magnesium: 1.6 mg/dL (ref 1.6–2.3)

## 2020-05-04 LAB — PARATHYROID HORMONE, INTACT (NO CA): PTH: 78 pg/mL — ABNORMAL HIGH (ref 15–65)

## 2020-05-04 NOTE — Telephone Encounter (Signed)
Attempted to contact patient, no answer or voicemail. Okay for PEC to advise patient.  

## 2020-05-04 NOTE — Telephone Encounter (Signed)
-----   Message from Birdie Sons, MD sent at 05/04/2020  8:11 AM EDT ----- Calcium and magnesium levels are back to normal. The low levels were probably a side effect of torsemide which he still needs to take to keep swelling under control. He needs to continue taking supplemental calcium and magnesium as long as he he is on torsemide.  Uric acid levels are normal, so he probably does not have gout.  Continue current medications and schedule follow up 1 month.

## 2020-05-04 NOTE — Telephone Encounter (Signed)
Patient returned call and was read lab note by Dr [name] Section 05/04/20. He verbalized understanding of all information and follow up appointment scheduled for July 20th.

## 2020-05-11 NOTE — Telephone Encounter (Signed)
Labs were ordered

## 2020-05-16 DIAGNOSIS — I495 Sick sinus syndrome: Secondary | ICD-10-CM | POA: Diagnosis not present

## 2020-05-18 LAB — SPECIMEN STATUS REPORT

## 2020-05-18 LAB — MAGNESIUM: Magnesium: 1.2 mg/dL — ABNORMAL LOW (ref 1.6–2.3)

## 2020-05-28 DIAGNOSIS — J9620 Acute and chronic respiratory failure, unspecified whether with hypoxia or hypercapnia: Secondary | ICD-10-CM | POA: Diagnosis not present

## 2020-06-13 ENCOUNTER — Ambulatory Visit: Payer: Self-pay | Admitting: Family Medicine

## 2020-06-13 NOTE — Progress Notes (Deleted)
Established patient visit   Patient: Taylor [name]   DOB: [birth year]   79 y.o. Male  MRN: [id number] Visit Date: 06/13/2020  Today's healthcare provider: Lelon Huh, MD   No chief complaint on file.  Subjective    HPI  Hypertension, follow-up  BP Readings from Last 3 Encounters:  05/01/20 (!) 143/82  04/26/20 138/82  04/20/20 137/87   Wt Readings from Last 3 Encounters:  05/01/20 223 lb (101.2 kg)  04/26/20 220 lb (99.8 kg)  04/19/20 212 lb (96.2 kg)     He was last seen for hypertension 1 months ago.  BP at that visit was 143/82. Management since that visit includes starting hydrochlorothiazide 25mg  once a day.  He reports {excellent/good/fair/poor:19665} compliance with treatment. He {is/is not:9024} having side effects. {document side effects if present:1} He is following a {diet:21022986} diet. He {is/is not:9024} exercising. He {does/does not:200015} smoke.  Use of agents associated with hypertension: NSAIDS.   Outside blood pressures are {***enter patient reported home BP readings, or 'not being checked':1}. Symptoms: {Yes/No:20286} chest pain {Yes/No:20286} chest pressure  {Yes/No:20286} palpitations {Yes/No:20286} syncope  {Yes/No:20286} dyspnea {Yes/No:20286} orthopnea  {Yes/No:20286} paroxysmal nocturnal dyspnea {Yes/No:20286} lower extremity edema   Pertinent labs: Lab Results  Component Value Date   CHOL 156 11/22/2019   HDL 43 11/22/2019   LDLCALC 77 11/22/2019   TRIG 182 (H) 11/22/2019   CHOLHDL 3.6 11/22/2019   Lab Results  Component Value Date   NA 144 05/03/2020   K 4.5 05/03/2020   CREATININE 2.05 (H) 05/03/2020   GFRNONAA 30 (L) 05/03/2020   GFRAA 35 (L) 05/03/2020   GLUCOSE 188 (H) 05/03/2020     The 10-year ASCVD risk score [name], et al., 2013) is: 36.6%   ---------------------------------------------------------------------------------------------------  {Show patient history (optional):23778::" "}    Medications: Outpatient Medications Prior to Visit  Medication Sig  . allopurinol (ZYLOPRIM) 300 MG tablet TAKE 2 TABLETS(600 MG) BY MOUTH DAILY (Patient taking differently: Take 600 mg by mouth daily. )  . aspirin 81 MG EC tablet Take 81 mg by mouth daily.   Marland Kitchen atorvastatin (LIPITOR) 40 MG tablet TAKE 1 TABLET BY MOUTH EVERY DAY (Patient taking differently: Take 40 mg by mouth daily. )  . carvedilol (COREG) 12.5 MG tablet TAKE 1 TABLET BY MOUTH DAILY (Patient taking differently: Take 12.5 mg by mouth daily. )  . COMBIVENT RESPIMAT 20-100 MCG/ACT AERS respimat SMARTSIG:2 Inhalation Via Inhaler 4 Times Daily PRN  . Cyanocobalamin (VITAMIN B-12 CR) 1000 MCG TBCR Take 1,000 mcg by mouth daily.   Marland Kitchen GLUCOSAMINE HCL PO Take 1,350 mg by mouth daily.  Marland Kitchen glucose blood (ONETOUCH VERIO) test strip Use as instructed to check sugar daily  . hydrochlorothiazide (HYDRODIURIL) 25 MG tablet Take 1 tablet (25 mg total) by mouth daily.  . indomethacin (INDOCIN) 50 MG capsule Take 1 capsule (50 mg total) by mouth 3 (three) times daily as needed (for gout flares).  . losartan (COZAAR) 50 MG tablet Take 1 tablet (50 mg total) by mouth daily.  . pantoprazole (PROTONIX) 40 MG tablet TAKE 1 TABLET BY MOUTH EVERY DAY (Patient taking differently: Take 40 mg by mouth daily. )  . torsemide (DEMADEX) 20 MG tablet Take 20 mg by mouth daily.   No facility-administered medications prior to visit.    Review of Systems  Constitutional: Negative for appetite change, chills and fever.  Respiratory: Negative for chest tightness, shortness of breath and wheezing.   Cardiovascular: Negative for chest pain and  palpitations.  Gastrointestinal: Negative for abdominal pain, nausea and vomiting.    {Heme  Chem  Endocrine  Serology  Results Review (optional):23779::" "}  Objective    There were no vitals taken for this visit. {Show previous vital signs (optional):23777::" "}  Physical Exam  ***  No results found for  any visits on 06/13/20.  Assessment & Plan     ***  No follow-ups on file.      {provider attestation***:1}   Lelon Huh, MD  [facility] [phone] (phone) [phone] (fax)  [place]

## 2020-06-18 IMAGING — DX DG CHEST 1V PORT
1 series · 1 of 1 positions shown · non-contrast
Comparison: December 04, 2018

CLINICAL DATA: Shortness of breath x7 weeks.

EXAM:
PORTABLE CHEST 1 VIEW

[chest ap]
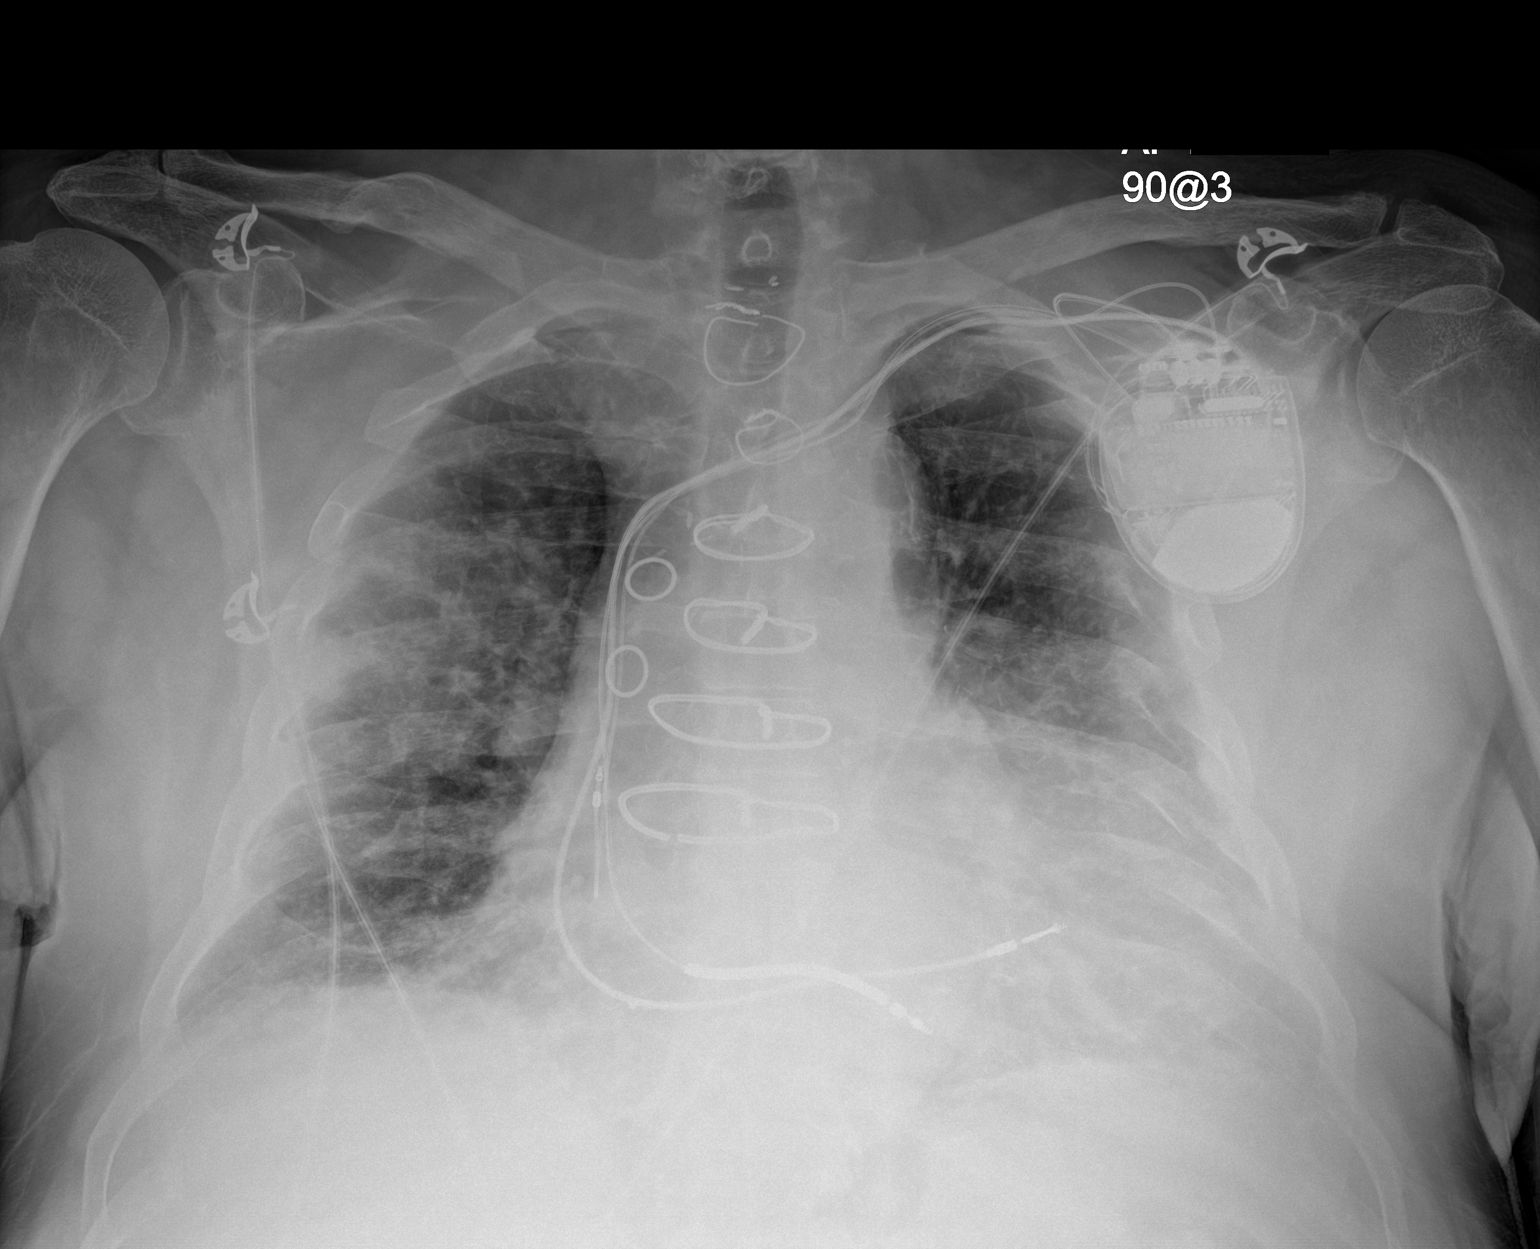

[1 of 1 positions shown; findings below may reference images not displayed]

FINDINGS: There are new multifocal airspace opacities bilaterally, greatest in
the right mid lung zone. A multi lead left-sided pacemaker is noted
with stable appearance of the leads. The patient is status post
prior median sternotomy. The inferior-most sternotomy wire is
fractured as is the superior most sternotomy wire. The heart size is
significantly enlarged. There are multiple old left-sided rib
fractures. There is no pneumothorax. There are probable small
bilateral pleural effusions. There is likely an old healed right
clavicle fracture.
IMPRESSION: 1. New multifocal airspace opacities bilaterally, greatest in the
right mid lung zone. Findings are suspicious for multifocal
pneumonia or developing pulmonary edema.
2. Probable small bilateral pleural effusions.
3. Stable cardiomegaly.

## 2020-06-18 IMAGING — CT CT ANGIO CHEST
2 of 6 series · 17 of 46 positions shown · IV contrast (APPLIED)
Comparison: Radiograph earlier this day.  Chest CTA 07/13/2015

CLINICAL DATA: Shortness of breath.  Dyspnea.

EXAM:
CT ANGIOGRAPHY CHEST WITH CONTRAST
TECHNIQUE: Multidetector CT imaging of the chest was performed using the
standard protocol during bolus administration of intravenous
contrast. Multiplanar CT image reconstructions and MIPs were
obtained to evaluate the vascular anatomy.
CONTRAST:  60mL OMNIPAQUE IOHEXOL 350 MG/ML SOLN

[Series 5: thins (person_name) · axial · 0.70mm/px · z∈[-429,-184]mm · 15 of 269 slices shown]
[im 12/269  lung]
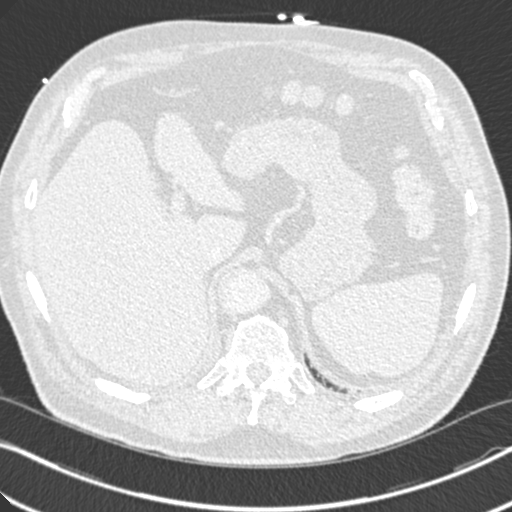
[im 35/269  soft-tissue]
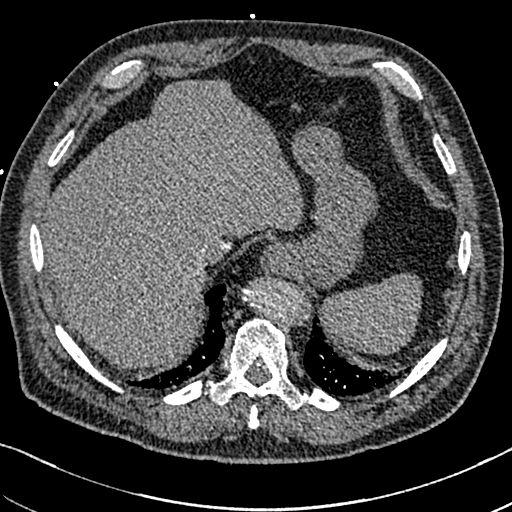
[im 47/269  lung]
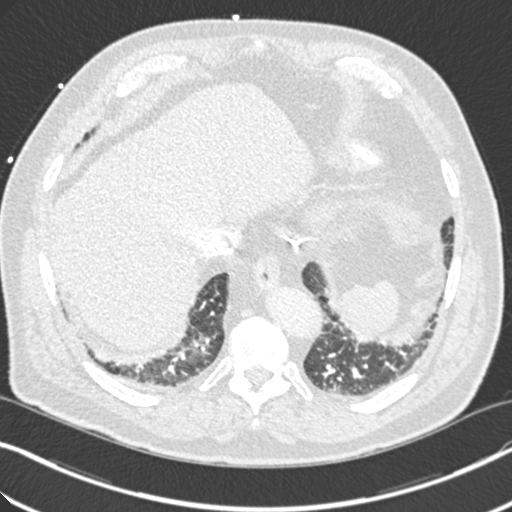
[im 70/269  soft-tissue]
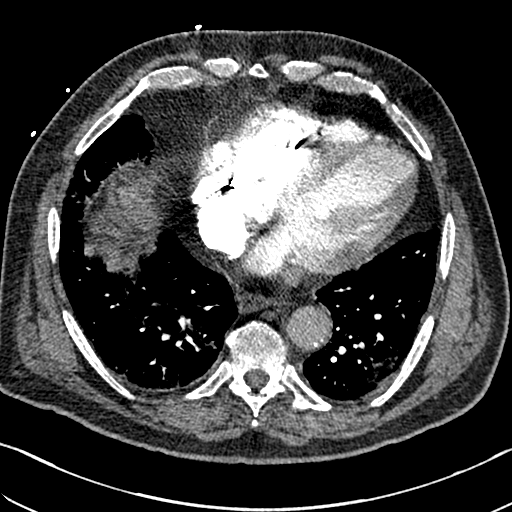
[im 82/269  lung]
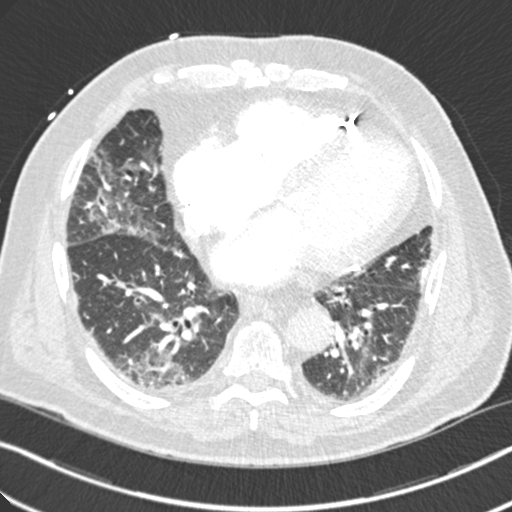
[im 105/269  soft-tissue]
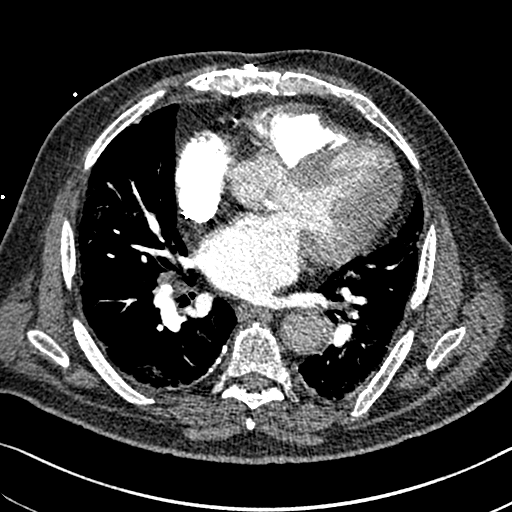
[im 117/269  lung]
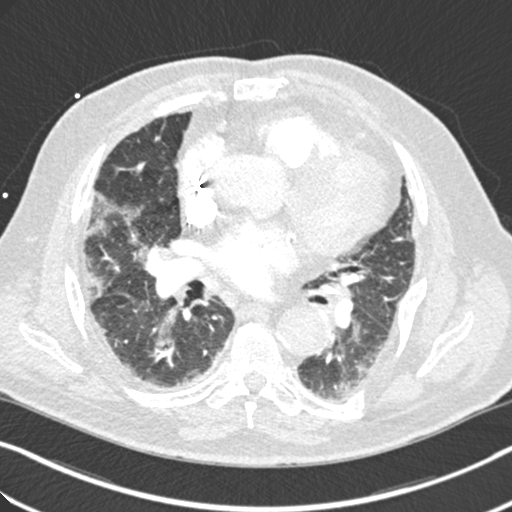
[im 140/269  soft-tissue]
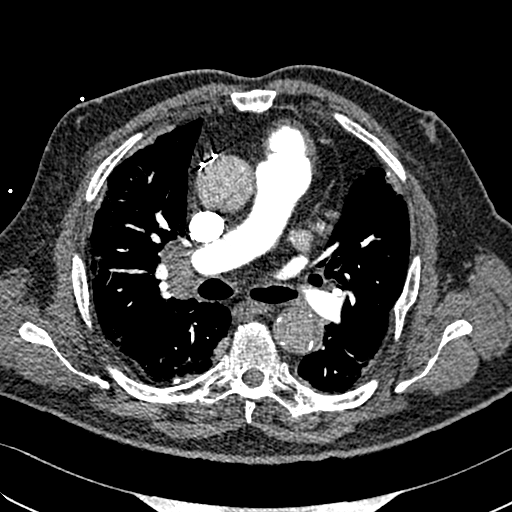
[im 152/269  lung]
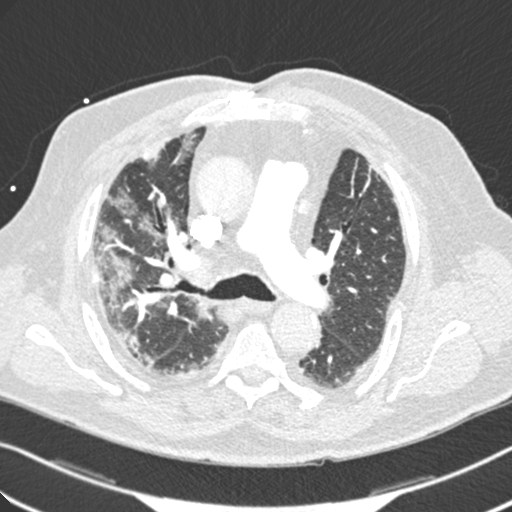
[im 164/269  soft-tissue]
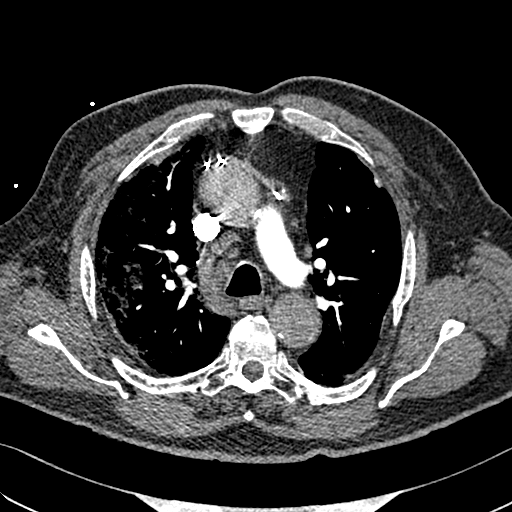
[im 187/269  lung]
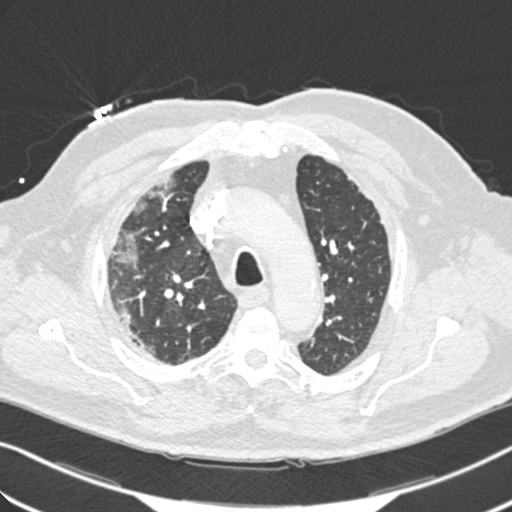
[im 199/269  soft-tissue]
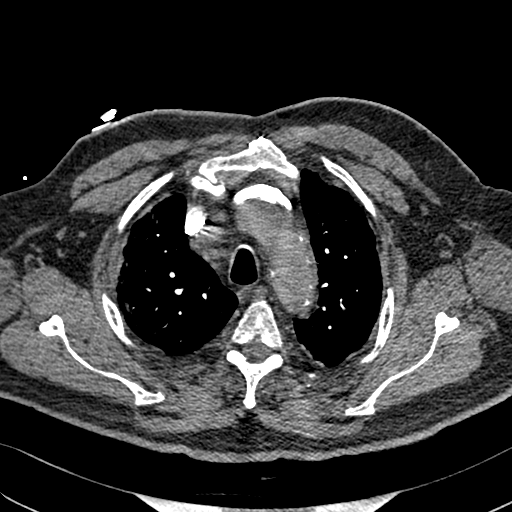
[im 222/269  lung]
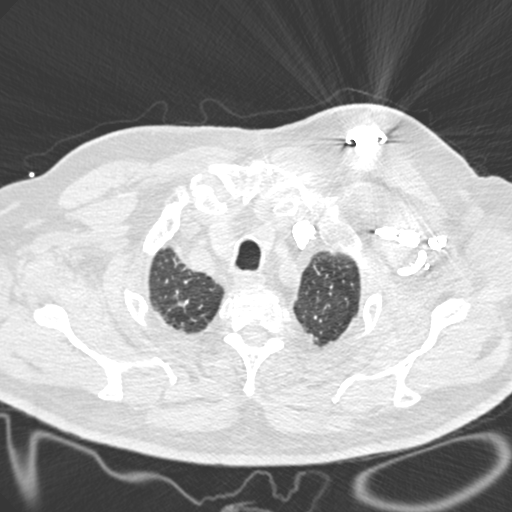
[im 234/269  soft-tissue]
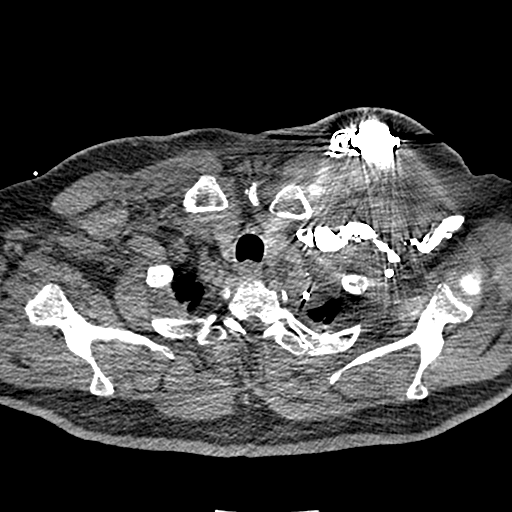
[im 257/269  lung]
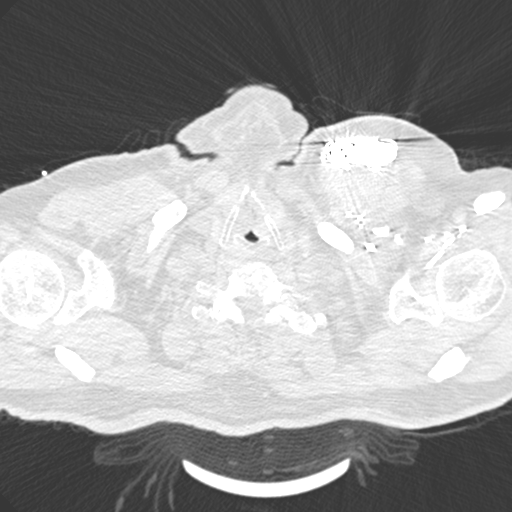

[Series 7: coronal mpr · coronal · 0.55mm/px · 2 of 84 slices shown]
[im 28/84  soft-tissue]
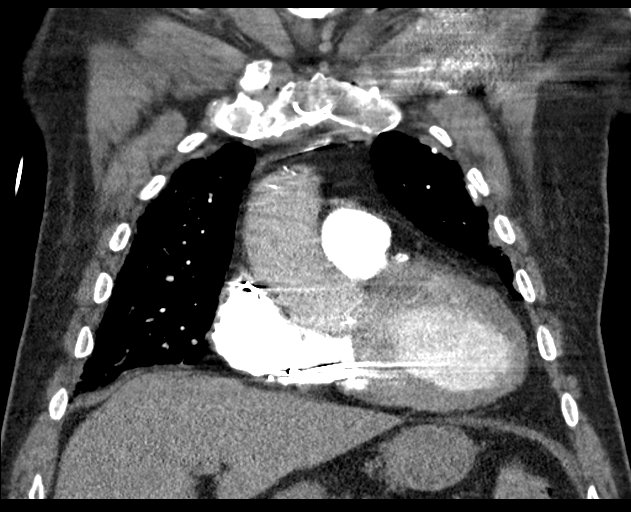
[im 56/84  soft-tissue]
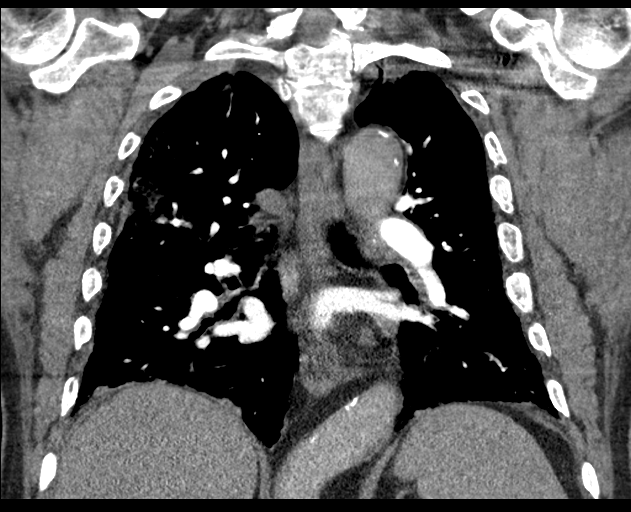

[17 of 46 positions shown; findings below may reference images not displayed]

FINDINGS: Cardiovascular: There are no filling defects within the pulmonary
arteries to suggest pulmonary embolus. Aortic atherosclerosis.
Cannot assess for dissection given phase of contrast tailored to
pulmonary artery evaluation. Multi chamber cardiomegaly.
Calcification of native coronary arteries, post CABG. Contrast
refluxes minimally into the IVC. No pericardial effusion. Left-sided
pacemaker in place with the leads in the right atrium and ventricle.

Mediastinum/Nodes: Right hilar adenopathy with nodes measuring up to
18 mm short axis. Right subcarinal/infrahilar node measures 10 mm
short axis. Multiple prominent right paratracheal nodes, largest 12
mm short axis. There scattered highest mediastinal nodes. Small
prevascular nodes. Calcified left hilar nodes consistent with prior
granulomatous disease. Adjacent to the calcified left hilar node is
a prominent 9 mm hilar node. Esophagus is patulous. No esophageal
wall thickening. No thyroid nodule.

Lungs/Pleura: Bilateral pleural nodularity and pleural plaques which
have calcified since 0837. No frank pleural effusion. Patchy
ground-glass opacities throughout the entire right lung and to a
lesser extent left upper and lower lobe. Trace fissural thickening
of the minor fissure. Trachea and mainstem bronchi are patent. No
dominant pulmonary mass.

Upper Abdomen: No acute findings.

Musculoskeletal: Post median sternotomy. Stable bone island in lower
thoracic vertebra. Remote left rib fractures. There are no acute or
suspicious osseous abnormalities.

Review of the MIP images confirms the above findings.
IMPRESSION: 1. No pulmonary embolus.
2. Patchy ground-glass opacities throughout the right greater than
left lung. Findings favor infection, including atypical viral
organisms ( [id number]). Other infectious or non infectious etiologies
are also considered, distribution is not typical of pulmonary edema.
3. Right hilar and mild mediastinal adenopathy. This is likely
reactive, however nonspecific. Consider follow-up CT after
resolution of symptoms.
4. Diffuse bilateral pleural nodularity which has progressively
calcified since 0837. Findings most consistent with prior asbestos
exposure.
5. Cardiomegaly. Trace contrast refluxes into the IVC suggesting
elevated right heart pressures.
6.  Aortic Atherosclerosis ([id number]).

## 2020-06-28 DIAGNOSIS — J9620 Acute and chronic respiratory failure, unspecified whether with hypoxia or hypercapnia: Secondary | ICD-10-CM | POA: Diagnosis not present

## 2020-06-28 DIAGNOSIS — U071 COVID-19: Secondary | ICD-10-CM | POA: Diagnosis not present

## 2020-06-28 NOTE — Progress Notes (Addendum)
Subjective:   Taylor [name] is a 79 y.o. male who presents for Medicare Annual/Subsequent preventive examination.  I connected with Taylor [name] today by telephone and verified that I am speaking with the correct person using two identifiers. Location patient: home Location provider: work Persons participating in the virtual visit: patient, provider.   I discussed the limitations, risks, security and privacy concerns of performing an evaluation and management service by telephone and the availability of in person appointments. I also discussed with the patient that there may be a patient responsible charge related to this service. The patient expressed understanding and verbally consented to this telephonic visit.    Interactive audio and video telecommunications were attempted between this provider and patient, however failed, due to patient having technical difficulties OR patient did not have access to video capability.  We continued and completed visit with audio only.   Review of Systems    N/A  Cardiac Risk Factors include: advanced age (>55men, >1 women);dyslipidemia;hypertension;male gender;obesity (BMI >30kg/m2)     Objective:    There were no vitals filed for this visit. There is no height or weight on file to calculate BMI.  Advanced Directives 06/29/2020 11/23/2019 11/22/2019 11/14/2019 11/13/2019 11/12/2019 04/07/2019  Does Patient Have a Medical Advance Directive? Yes Yes No - Yes No Yes  Type of Paramedic of [place];Living will Living will Living will - Living will - [place];Living will  Does patient want to make changes to medical advance directive? - No - Patient declined No - Patient declined No - Patient declined No - Patient declined - -  Copy of [place] in Chart? No - copy requested - - - - - No - copy requested  Would patient like information on creating a medical advance directive? - - No - Patient  declined - - - -    Current Medications (verified) Outpatient Encounter Medications as of 06/29/2020  Medication Sig  . allopurinol (ZYLOPRIM) 300 MG tablet TAKE 2 TABLETS(600 MG) BY MOUTH DAILY (Patient taking differently: Take 600 mg by mouth daily. )  . aspirin 81 MG EC tablet Take 81 mg by mouth daily.   . carvedilol (COREG) 12.5 MG tablet TAKE 1 TABLET BY MOUTH DAILY (Patient taking differently: Take 12.5 mg by mouth daily. )  . COMBIVENT RESPIMAT 20-100 MCG/ACT AERS respimat SMARTSIG:2 Inhalation Via Inhaler 4 Times Daily PRN  . Cyanocobalamin (VITAMIN B-12 CR) 1000 MCG TBCR Take 1,000 mcg by mouth daily.   Marland Kitchen GLUCOSAMINE HCL PO Take 1,350 mg by mouth daily.  Marland Kitchen glucose blood (ONETOUCH VERIO) test strip Use as instructed to check sugar daily  . indomethacin (INDOCIN) 50 MG capsule Take 1 capsule (50 mg total) by mouth 3 (three) times daily as needed (for gout flares).  . losartan (COZAAR) 50 MG tablet Take 1 tablet (50 mg total) by mouth daily.  . pantoprazole (PROTONIX) 40 MG tablet TAKE 1 TABLET BY MOUTH EVERY DAY (Patient taking differently: Take 40 mg by mouth daily. )  . torsemide (DEMADEX) 20 MG tablet Take 20 mg by mouth daily.  Marland Kitchen triamcinolone ointment (KENALOG) 0.1 % Apply 1 application topically 2 (two) times daily. As directed  . atorvastatin (LIPITOR) 40 MG tablet TAKE 1 TABLET BY MOUTH EVERY DAY (Patient not taking: Reported on 06/29/2020)  . hydrochlorothiazide (HYDRODIURIL) 25 MG tablet Take 1 tablet (25 mg total) by mouth daily.   No facility-administered encounter medications on file as of 06/29/2020.  Allergies (verified) Amlodipine besylate, Crestor [rosuvastatin], and Rocephin [ceftriaxone]   History: Past Medical History:  Diagnosis Date  . AAA (abdominal aortic aneurysm) [facility]) 06/03/2007   [facility]; Dr. Kellie Simmering  . AICD (automatic cardioverter/defibrillator) present   . Barrett's esophagus   . Bladder cancer ([place])   . Bradycardia   . CAD  (coronary artery disease)   . CHF (congestive heart failure) ([place])   . Cluster headache   . DDD (degenerative disc disease), lumbar   . Dyspnea    WITH EXERTION  . Edema    LEFT ANKLE  . Fracture of skull base ([place]) 1997   due to fall  . GERD (gastroesophageal reflux disease)   . Gout   . History of bladder cancer 12/1995  . Hyperlipidemia   . Hypertension   . Malignant melanoma ([place]) 12/2012   right dorsal forearm excised  . Myocardial infarction ([place])    LAST 2014  . Osteoarthritis of knee   . Pacemaker 10/10/2006  . Pneumonia    2016  . Pre-diabetes   . Psoriasis   . Rib fracture 1997   due to fall  . Sleep apnea    CPAP  . Stroke ([place])   . Venous incompetence    Past Surgical History:  Procedure Laterality Date  . ABDOMINAL AORTIC ANEURYSM REPAIR  06/03/2007   [facility]; Dr. Kellie Simmering  . ANGIOPLASTY  1994   MI  . BLADDER TUMOR EXCISION  12/1995  . CATARACT EXTRACTION W/PHACO Left 10/22/2016   Procedure: CATARACT EXTRACTION PHACO AND INTRAOCULAR LENS PLACEMENT (IOC);  Surgeon: Birder Robson, MD;  Location: [facility] ORS;  Service: Ophthalmology;  Laterality: Left;  [place] 47.7 AP% 18.4 CDE 8.78 Fluid pack lot # [id number]  . CATARACT EXTRACTION W/PHACO Right 12/10/2016   Procedure: CATARACT EXTRACTION PHACO AND INTRAOCULAR LENS PLACEMENT (IOC);  Surgeon: Birder Robson, MD;  Location: [facility] ORS;  Service: Ophthalmology;  Laterality: Right;  [place] 00:39 AP% 23.3 CDE 9.13 Fluid pack lot # [id number]  . CORONARY ANGIOPLASTY     STENTS X 5  . CORONARY ARTERY BYPASS GRAFT  09/22/2006   four  . ELBOW BURSA SURGERY     DUE TO GOUT  . INSERT / REPLACE / REMOVE PACEMAKER    . MELANOMA EXCISION  12/2012   Right forearm   Family History  Problem Relation Age of Onset  . Cancer Mother        Melanoma skin cancer  . Heart attack Father 68  . Cancer Father        throat cancer  . Arthritis Brother    Social History   Socioeconomic History  . Marital  status: Married    Spouse name: Not on file  . Number of children: 3  . Years of education: Not on file  . Highest education level: 12th grade  Occupational History  . Occupation: retired    Comment: previously worked as a Lawyer  . Smoking status: Former Smoker    Packs/day: 1.00    Types: Cigarettes    Quit date: 11/26/1999    Years since quitting: 20.6  . Smokeless tobacco: Never Used  Vaping Use  . Vaping Use: Never used  Substance and Sexual Activity  . Alcohol use: No  . Drug use: No  . Sexual activity: Not on file  Other Topics Concern  . Not on file  Social History Narrative   Lives at home with his family.  Independent at baseline.  Social Determinants of Health   Financial Resource Strain: Low Risk   . Difficulty of Paying Living Expenses: Not hard at all  Food Insecurity: No Food Insecurity  . Worried About Charity fundraiser in the Last Year: Never true  . Ran Out of Food in the Last Year: Never true  Transportation Needs: No Transportation Needs  . Lack of Transportation (Medical): No  . Lack of Transportation (Non-Medical): No  Physical Activity: Inactive  . Days of Exercise per Week: 0 days  . Minutes of Exercise per Session: 0 min  Stress: No Stress Concern Present  . Feeling of Stress : Not at all  Social Connections: Socially Integrated  . Frequency of Communication with Friends and Family: More than three times a week  . Frequency of Social Gatherings with Friends and Family: More than three times a week  . Attends Religious Services: More than 4 times per year  . Active Member of Clubs or Organizations: Yes  . Attends Archivist Meetings: More than 4 times per year  . Marital Status: Married    Tobacco Counseling Counseling given: Not Answered   Clinical Intake:  Pre-visit preparation completed: Yes  Pain : No/denies pain     Nutritional Risks: None Diabetes: No  How often do you need to have someone help  you when you read instructions, pamphlets, or other written materials from your doctor or pharmacy?: 1 - Never  Diabetic? No  Interpreter Needed?: No  Information entered by :: [facility], LPN   Activities of Daily Living In your present state of health, do you have any difficulty performing the following activities: 06/29/2020 11/23/2019  Hearing? Y N  Comment Has hearing loss in the left ear. Does not wear a hearing aid. -  Vision? N Y  Difficulty concentrating or making decisions? N N  Walking or climbing stairs? N N  Dressing or bathing? N N  Doing errands, shopping? N N  Preparing Food and eating ? N -  Using the Toilet? N -  In the past six months, have you accidently leaked urine? N -  Do you have problems with loss of bowel control? N -  Managing your Medications? N -  Managing your Finances? N -  Housekeeping or managing your Housekeeping? N -  Some recent data might be hidden    Patient Care Team: Birdie Sons, MD as PCP - General (Family Medicine) Corey Skains, MD as Consulting Physician (Internal Medicine) Birder Robson, MD as Referring Physician (Ophthalmology) Ottie Glazier, MD as Consulting Physician (Pulmonary Disease)  Indicate any recent Medical Services you may have received from other than [facility] providers in the past year (date may be approximate).     Assessment:   This is a routine wellness examination for [place].  Hearing/Vision screen No exam data present  Dietary issues and exercise activities discussed: Current Exercise Habits: The patient does not participate in regular exercise at present, Exercise limited by: respiratory conditions(s)  Goals    . DIET - REDUCE PORTION SIZE     Recommend decreasing portion sizes by half and eating 3 small meals a day with two healthy snacks in between.       Depression Screen PHQ 2/9 Scores 06/29/2020 05/11/2019 04/07/2019 04/07/2019 03/31/2018 03/31/2018 02/26/2017  PHQ - 2 Score 0 0 0 0 1 1 0  PHQ-  9 Score - 0 0 - 7 - 4    Fall Risk Fall Risk  06/29/2020 04/19/2020 04/07/2019 03/31/2018 02/26/2017  Falls in the past year? 0 0 1 No No  Number falls in past yr: 0 0 0 - -  Injury with Fall? 0 0 0 - -  Risk for fall due to : - - - - -  Follow up - Falls evaluation completed Falls prevention discussed - -    Any stairs in or around the home? Yes  If so, are there any without handrails? No  Home free of loose throw rugs in walkways, pet beds, electrical cords, etc? Yes  Adequate lighting in your home to reduce risk of falls? Yes   ASSISTIVE DEVICES UTILIZED TO PREVENT FALLS:  Life alert? No  Use of a cane, walker or w/c? No  Grab bars in the bathroom? No  Shower chair or bench in shower? No  Elevated toilet seat or a handicapped toilet? No   Cognitive Function:     6CIT Screen 03/31/2018 02/26/2017  What Year? 0 points 0 points  What month? 0 points 0 points  What time? 0 points 0 points  Count back from 20 0 points 0 points  Months in reverse 0 points 0 points  Repeat phrase 2 points 4 points  Total Score 2 4    Immunizations Immunization History  Administered Date(s) Administered  . Influenza Split 09/18/2011  . Influenza, High Dose Seasonal PF 08/16/2014, 10/09/2016, 10/09/2017, 08/26/2018  . Influenza,inj,Quad PF,6+ Mos 12/20/2015  . PFIZER SARS-COV-2 Vaccination 02/24/2020, 03/21/2020  . Pneumococcal Conjugate-13 05/09/2014  . Pneumococcal Polysaccharide-23 05/12/2015    TDAP status: Due, Education has been provided regarding the importance of this vaccine. Advised may receive this vaccine at local pharmacy or Health Dept. Aware to provide a copy of the vaccination record if obtained from local pharmacy or Health Dept. Verbalized acceptance and understanding. Flu Vaccine status: Due fall 2021 Pneumococcal vaccine status: Up to date Covid-19 vaccine status: Completed vaccines  Qualifies for Shingles Vaccine? Yes   Zostavax completed No   Shingrix Completed?: No.     Education has been provided regarding the importance of this vaccine. Patient has been advised to call insurance company to determine out of pocket expense if they have not yet received this vaccine. Advised may also receive vaccine at local pharmacy or Health Dept. Verbalized acceptance and understanding.  Screening Tests Health Maintenance  Topic Date Due  . INFLUENZA VACCINE  06/25/2020  . TETANUS/TDAP  12/06/2023 (Originally 10/06/1960)  . COVID-19 Vaccine  Completed  . PNA vac Low Risk Adult  Completed    Health Maintenance  Health Maintenance Due  Topic Date Due  . INFLUENZA VACCINE  06/25/2020    Colorectal cancer screening: No longer required.   Lung Cancer Screening: (Low Dose CT Chest recommended if Age 18-80 years, 30 pack-year currently smoking OR have quit w/in 15years.) does not qualify.   Additional Screening:  Vision Screening: Recommended annual ophthalmology exams for early detection of glaucoma and other disorders of the eye. Is the patient up to date with their annual eye exam?  Yes  Who is the provider or what is the name of the office in which the patient attends annual eye exams? Dr George Ina @ [place] If pt is not established with a provider, would they like to be referred to a provider to establish care? No .   Dental Screening: Recommended annual dental exams for proper oral hygiene  Community Resource Referral / Chronic Care Management: CRR required this visit?  No   CCM required this visit?  No  Plan:     I have personally reviewed and noted the following in the patient's chart:   . Medical and social history . Use of alcohol, tobacco or illicit drugs  . Current medications and supplements . Functional ability and status . Nutritional status . Physical activity . Advanced directives . List of other physicians . Hospitalizations, surgeries, and ER visits in previous 12 months . Vitals . Screenings to include cognitive, depression, and  falls . Referrals and appointments  In addition, I have reviewed and discussed with patient certain preventive protocols, quality metrics, and best practice recommendations. A written personalized care plan for preventive services as well as general preventive health recommendations were provided to patient.     Anela Bensman [place], [place]   2/0/0379   Nurse Notes: None.

## 2020-06-28 NOTE — Progress Notes (Signed)
Established patient visit   Patient: Taylor [name]   DOB: 05-12-1941   79 y.o. Male  MRN: [id number] Visit Date: 06/30/2020  Today's healthcare provider: Lelon Huh, MD   Chief Complaint  Patient presents with  . Hypertension   [employer] as a scribe for Lelon Huh, MD.,have documented all relevant documentation on the behalf of Lelon Huh, MD,as directed by  Lelon Huh, MD while in the presence of Lelon Huh, MD.  Subjective    HPI  Hypertension, follow-up     BP Readings from Last 3 Encounters:  05/01/20 (!) 143/82  04/26/20 138/82  04/20/20 137/87      Wt Readings from Last 3 Encounters:  05/01/20 223 lb (101.2 kg)  04/26/20 220 lb (99.8 kg)  04/19/20 212 lb (96.2 kg)    He was last seen for hypertension 1 months ago.  BP at that visit was 143/82. Management since that visit includes starting hydrochlorothiazide 25mg  once a day.  He reports good compliance with treatment. He is not having side effects.  He is following a Regular diet. He is not exercising. He does not smoke.  Use of agents associated with hypertension: NSAIDS.   Outside blood pressures are being checked occasionally. Symptoms: No chest pain No chest pressure  No palpitations No syncope  No dyspnea No orthopnea  No paroxysmal nocturnal dyspnea No lower extremity edema   Pertinent labs: Recent Labs         Lab Results  Component Value Date   CHOL 156 11/22/2019   HDL 43 11/22/2019   LDLCALC 77 11/22/2019   TRIG 182 (H) 11/22/2019   CHOLHDL 3.6 11/22/2019     Recent Labs         Lab Results  Component Value Date   NA 144 05/03/2020   K 4.5 05/03/2020   CREATININE 2.05 (H) 05/03/2020   GFRNONAA 30 (L) 05/03/2020   GFRAA 35 (L) 05/03/2020   GLUCOSE 188 (H) 05/03/2020       The 10-year ASCVD risk score [name], et al., 2013) is: 36.6%   He does have history of hypocalcemia and hypomagnesemia when he was on torsemide.  He has since  switched back to furosemide with addition of hctz as above. He also has history of severe gout on 600mg  daily allopurinol, but has not had any recent gout flares.  ---------------------------------------------------------------------------------------------------  He states he remains short of breath which seems to be getting worse. 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 He is no longer on prednisone and not using any inhalers.      Medications: Outpatient Medications Prior to Visit  Medication Sig  . allopurinol (ZYLOPRIM) 300 MG tablet TAKE 2 TABLETS(600 MG) BY MOUTH DAILY (Patient taking differently: Take 600 mg by mouth daily. )  . aspirin 81 MG EC tablet Take 81 mg by mouth daily.   Marland Kitchen atorvastatin (LIPITOR) 40 MG tablet TAKE 1 TABLET BY MOUTH EVERY DAY  . carvedilol (COREG) 12.5 MG tablet TAKE 1 TABLET BY MOUTH DAILY (Patient taking differently: Take 12.5 mg by mouth daily. )  . COMBIVENT RESPIMAT 20-100 MCG/ACT AERS respimat SMARTSIG:2 Inhalation Via Inhaler 4 Times Daily PRN  . Cyanocobalamin (VITAMIN B-12 CR) 1000 MCG TBCR Take 1,000 mcg by mouth daily.   Marland Kitchen GLUCOSAMINE HCL PO Take 1,350 mg by mouth daily.  Marland Kitchen glucose blood (ONETOUCH VERIO) test strip Use as instructed to check sugar daily  . hydrochlorothiazide (HYDRODIURIL) 25 MG  tablet Take 1 tablet (25 mg total) by mouth daily.  . indomethacin (INDOCIN) 50 MG capsule Take 1 capsule (50 mg total) by mouth 3 (three) times daily as needed (for gout flares).  . losartan (COZAAR) 50 MG tablet Take 1 tablet (50 mg total) by mouth daily.  . pantoprazole (PROTONIX) 40 MG tablet TAKE 1 TABLET BY MOUTH EVERY DAY (Patient taking differently: Take 40 mg by mouth daily. )  . torsemide (DEMADEX) 20 MG tablet Take 20 mg by mouth daily.  Marland Kitchen triamcinolone ointment (KENALOG) 0.1 % Apply 1 application topically 2 (two) times daily. As directed   No facility-administered medications prior to visit.     Review of Systems  Constitutional: Negative.   HENT: Positive for congestion and rhinorrhea.   Respiratory: Positive for shortness of breath.   Cardiovascular: Negative.   Musculoskeletal: Negative.       Objective    BP 129/72 (BP Location: Right Arm, Patient Position: Sitting, Cuff Size: Large)   Pulse 66   Temp 98.2 F (36.8 C) (Oral)   Ht 5\' 10"  (1.778 m)   Wt 218 lb 12.8 oz (99.2 kg)   BMI 31.39 kg/m    Physical Exam   General: Appearance:    Obese male in no acute distress  Eyes:    PERRL, conjunctiva/corneas clear, EOM's intact       Lungs:     Clear to auscultation bilaterally, respirations unlabored  Heart:    Normal heart rate. Regularly irregular rhythm. No murmurs, rubs, or gallops.   MS:   All extremities are intact.   Neurologic:   Awake, alert, oriented x 3. No apparent focal neurological           defect.         Assessment & Plan     1. Essential (primary) hypertension Better with addition of hctz which he is tolerating well.   2. Hypocalcemia Was likely exacerbate by high potency loop diuretic, now stable on oral calcium supplements and change back to furosemide  - Renal function panel  3. Hyperuricemia On 600mg  daily allopurinol, need to check - Uric acid since adding hctz.   4. Hypomagnesemia Continue magnesium supplement - Magnesium  5. Shortness of breath  - B Nat Peptide - CBC  May need to get back in with pulmonary. Given sample disk of Breo 100 today.   - fluticasone furoate-vilanterol (BREO ELLIPTA) 100-25 MCG/INH AEPB; Inhale 1 puff into the lungs daily as needed.  Dispense: 1 each   No follow-ups on file.         Lelon Huh, MD  [facility] [phone] (phone) [phone] (fax)  [place]

## 2020-06-29 ENCOUNTER — Ambulatory Visit (INDEPENDENT_AMBULATORY_CARE_PROVIDER_SITE_OTHER): Payer: Medicare Other

## 2020-06-29 ENCOUNTER — Other Ambulatory Visit: Payer: Self-pay

## 2020-06-29 DIAGNOSIS — Z0000 Encounter for general adult medical examination without abnormal findings: Secondary | ICD-10-CM

## 2020-06-29 NOTE — Patient Instructions (Signed)
[name] , Thank you for taking time to come for your Medicare Wellness Visit. I appreciate your ongoing commitment to your health goals. Please review the following plan we discussed and let me know if I can assist you in the future.   Screening recommendations/referrals: Colonoscopy: No longer required.  Recommended yearly ophthalmology/optometry visit for glaucoma screening and checkup Recommended yearly dental visit for hygiene and checkup  Vaccinations: Influenza vaccine: Due fall 2021 Pneumococcal vaccine: Completed series Tdap vaccine: Currently due, declined today.  Shingles vaccine: Shingrix discussed. Please contact your pharmacy for coverage information.     Advanced directives: Please bring a copy of your POA (Power of Attorney) and/or Living Will to your next appointment.   Conditions/risks identified: Recommend decreasing portion sizes by half and eating 3 small meals a day with two healthy snacks in between.   Next appointment: 06/30/20 @ 10:40 AM with Dr [name] Section   Preventive Care 100 Years and Older, Male Preventive care refers to lifestyle choices and visits with your health care provider that can promote health and wellness. What does preventive care include?  A yearly physical exam. This is also called an annual well check.  Dental exams once or twice a year.  Routine eye exams. Ask your health care provider how often you should have your eyes checked.  Personal lifestyle choices, including:  Daily care of your teeth and gums.  Regular physical activity.  Eating a healthy diet.  Avoiding tobacco and drug use.  Limiting alcohol use.  Practicing safe sex.  Taking low doses of aspirin every day.  Taking vitamin and mineral supplements as recommended by your health care provider. What happens during an annual well check? The services and screenings done by your health care provider during your annual well check will depend on your age, overall health,  lifestyle risk factors, and family history of disease. Counseling  Your health care provider may ask you questions about your:  Alcohol use.  Tobacco use.  Drug use.  Emotional well-being.  Home and relationship well-being.  Sexual activity.  Eating habits.  History of falls.  Memory and ability to understand (cognition).  Work and work Statistician. Screening  You may have the following tests or measurements:  Height, weight, and BMI.  Blood pressure.  Lipid and cholesterol levels. These may be checked every 5 years, or more frequently if you are over 2 years old.  Skin check.  Lung cancer screening. You may have this screening every year starting at age 90 if you have a 30-pack-year history of smoking and currently smoke or have quit within the past 15 years.  Fecal occult blood test (FOBT) of the stool. You may have this test every year starting at age 53.  Flexible sigmoidoscopy or colonoscopy. You may have a sigmoidoscopy every 5 years or a colonoscopy every 10 years starting at age 72.  Prostate cancer screening. Recommendations will vary depending on your family history and other risks.  Hepatitis C blood test.  Hepatitis B blood test.  Sexually transmitted disease (STD) testing.  Diabetes screening. This is done by checking your blood sugar (glucose) after you have not eaten for a while (fasting). You may have this done every 1-3 years.  Abdominal aortic aneurysm (AAA) screening. You may need this if you are a current or former smoker.  Osteoporosis. You may be screened starting at age 39 if you are at high risk. Talk with your health care provider about your test results, treatment options, and if  necessary, the need for more tests. Vaccines  Your health care provider may recommend certain vaccines, such as:  Influenza vaccine. This is recommended every year.  Tetanus, diphtheria, and acellular pertussis (Tdap, Td) vaccine. You may need a Td booster  every 10 years.  Zoster vaccine. You may need this after age 75.  Pneumococcal 13-valent conjugate (PCV13) vaccine. One dose is recommended after age 33.  Pneumococcal polysaccharide (PPSV23) vaccine. One dose is recommended after age 12. Talk to your health care provider about which screenings and vaccines you need and how often you need them. This information is not intended to replace advice given to you by your health care provider. Make sure you discuss any questions you have with your health care provider. Document Released: 12/08/2015 Document Revised: 07/31/2016 Document Reviewed: 09/12/2015 Elsevier Interactive Patient Education  2017 [place] Prevention in the Home Falls can cause injuries. They can happen to people of all ages. There are many things you can do to make your home safe and to help prevent falls. What can I do on the outside of my home?  Regularly fix the edges of walkways and driveways and fix any cracks.  Remove anything that might make you trip as you walk through a door, such as a raised step or threshold.  Trim any bushes or trees on the path to your home.  Use bright outdoor lighting.  Clear any walking paths of anything that might make someone trip, such as rocks or tools.  Regularly check to see if handrails are loose or broken. Make sure that both sides of any steps have handrails.  Any raised decks and porches should have guardrails on the edges.  Have any leaves, snow, or ice cleared regularly.  Use sand or salt on walking paths during winter.  Clean up any spills in your garage right away. This includes oil or grease spills. What can I do in the bathroom?  Use night lights.  Install grab bars by the toilet and in the tub and shower. Do not use towel bars as grab bars.  Use non-skid mats or decals in the tub or shower.  If you need to sit down in the shower, use a plastic, non-slip stool.  Keep the floor dry. Clean up any  water that spills on the floor as soon as it happens.  Remove soap buildup in the tub or shower regularly.  Attach bath mats securely with double-sided non-slip rug tape.  Do not have throw rugs and other things on the floor that can make you trip. What can I do in the bedroom?  Use night lights.  Make sure that you have a light by your bed that is easy to reach.  Do not use any sheets or blankets that are too big for your bed. They should not hang down onto the floor.  Have a firm chair that has side arms. You can use this for support while you get dressed.  Do not have throw rugs and other things on the floor that can make you trip. What can I do in the kitchen?  Clean up any spills right away.  Avoid walking on wet floors.  Keep items that you use a lot in easy-to-reach places.  If you need to reach something above you, use a strong step stool that has a grab bar.  Keep electrical cords out of the way.  Do not use floor polish or wax that makes floors slippery. If you must  use wax, use non-skid floor wax.  Do not have throw rugs and other things on the floor that can make you trip. What can I do with my stairs?  Do not leave any items on the stairs.  Make sure that there are handrails on both sides of the stairs and use them. Fix handrails that are broken or loose. Make sure that handrails are as long as the stairways.  Check any carpeting to make sure that it is firmly attached to the stairs. Fix any carpet that is loose or worn.  Avoid having throw rugs at the top or bottom of the stairs. If you do have throw rugs, attach them to the floor with carpet tape.  Make sure that you have a light switch at the top of the stairs and the bottom of the stairs. If you do not have them, ask someone to add them for you. What else can I do to help prevent falls?  Wear shoes that:  Do not have high heels.  Have rubber bottoms.  Are comfortable and fit you well.  Are closed  at the toe. Do not wear sandals.  If you use a stepladder:  Make sure that it is fully opened. Do not climb a closed stepladder.  Make sure that both sides of the stepladder are locked into place.  Ask someone to hold it for you, if possible.  Clearly mark and make sure that you can see:  Any grab bars or handrails.  First and last steps.  Where the edge of each step is.  Use tools that help you move around (mobility aids) if they are needed. These include:  Canes.  Walkers.  Scooters.  Crutches.  Turn on the lights when you go into a dark area. Replace any light bulbs as soon as they burn out.  Set up your furniture so you have a clear path. Avoid moving your furniture around.  If any of your floors are uneven, fix them.  If there are any pets around you, be aware of where they are.  Review your medicines with your doctor. Some medicines can make you feel dizzy. This can increase your chance of falling. Ask your doctor what other things that you can do to help prevent falls. This information is not intended to replace advice given to you by your health care provider. Make sure you discuss any questions you have with your health care provider. Document Released: 09/07/2009 Document Revised: 04/18/2016 Document Reviewed: 12/16/2014 Elsevier Interactive Patient Education  2017 [employer].

## 2020-06-30 ENCOUNTER — Ambulatory Visit (INDEPENDENT_AMBULATORY_CARE_PROVIDER_SITE_OTHER): Payer: Medicare Other | Admitting: Family Medicine

## 2020-06-30 ENCOUNTER — Encounter: Payer: Self-pay | Admitting: Family Medicine

## 2020-06-30 ENCOUNTER — Other Ambulatory Visit: Payer: Self-pay

## 2020-06-30 VITALS — BP 129/72 | HR 66 | Temp 98.2°F | Ht 70.0 in | Wt 218.8 lb

## 2020-06-30 DIAGNOSIS — E790 Hyperuricemia without signs of inflammatory arthritis and tophaceous disease: Secondary | ICD-10-CM

## 2020-06-30 DIAGNOSIS — I5020 Unspecified systolic (congestive) heart failure: Secondary | ICD-10-CM

## 2020-06-30 DIAGNOSIS — I10 Essential (primary) hypertension: Secondary | ICD-10-CM | POA: Diagnosis not present

## 2020-06-30 DIAGNOSIS — R0602 Shortness of breath: Secondary | ICD-10-CM

## 2020-06-30 MED ORDER — BREO ELLIPTA 100-25 MCG/INH IN AEPB: 1.0000 | INHALATION_SPRAY | Freq: Every day | RESPIRATORY_TRACT | Status: DC | PRN

## 2020-07-01 LAB — CBC
Hematocrit: 40.5 % (ref 37.5–51.0)
Hemoglobin: 12.9 g/dL — ABNORMAL LOW (ref 13.0–17.7)
MCH: 31.1 pg (ref 26.6–33.0)
MCHC: 31.9 g/dL (ref 31.5–35.7)
MCV: 98 fL — ABNORMAL HIGH (ref 79–97)
Platelets: 113 10*3/uL — ABNORMAL LOW (ref 150–450)
RBC: 4.15 x10E6/uL (ref 4.14–5.80)
RDW: 14.6 % (ref 11.6–15.4)
WBC: 6.3 10*3/uL (ref 3.4–10.8)

## 2020-07-01 LAB — RENAL FUNCTION PANEL
Albumin: 3.6 g/dL — ABNORMAL LOW (ref 3.7–4.7)
BUN/Creatinine Ratio: 15 (ref 10–24)
BUN: 24 mg/dL (ref 8–27)
CO2: 26 mmol/L (ref 20–29)
Calcium: 9.1 mg/dL (ref 8.6–10.2)
Chloride: 103 mmol/L (ref 96–106)
Creatinine, Ser: 1.64 mg/dL — ABNORMAL HIGH (ref 0.76–1.27)
GFR calc Af Amer: 46 mL/min/{1.73_m2} — ABNORMAL LOW (ref >59)
GFR calc non Af Amer: 40 mL/min/{1.73_m2} — ABNORMAL LOW (ref >59)
Glucose: 119 mg/dL — ABNORMAL HIGH (ref 65–99)
Phosphorus: 2.9 mg/dL (ref 2.8–4.1)
Potassium: 3.7 mmol/L (ref 3.5–5.2)
Sodium: 143 mmol/L (ref 134–144)

## 2020-07-01 LAB — BRAIN NATRIURETIC PEPTIDE: BNP: 337.1 pg/mL — ABNORMAL HIGH (ref 0.0–100.0)

## 2020-07-01 LAB — MAGNESIUM: Magnesium: 1.2 mg/dL — ABNORMAL LOW (ref 1.6–2.3)

## 2020-07-01 LAB — URIC ACID: Uric Acid: 4.7 mg/dL (ref 3.8–8.4)

## 2020-07-02 ENCOUNTER — Other Ambulatory Visit: Payer: Self-pay | Admitting: Family Medicine

## 2020-07-02 NOTE — Telephone Encounter (Signed)
Requested Prescriptions  Pending Prescriptions Disp Refills   pantoprazole (PROTONIX) 40 MG tablet [Pharmacy Med Name: PANTOPRAZOLE 40MG  TABLETS] 90 tablet 4    Sig: TAKE 1 TABLET BY MOUTH EVERY DAY     Gastroenterology: Proton Pump Inhibitors Passed - 07/02/2020  9:34 AM      Passed - Valid encounter within last 12 months    Recent Outpatient Visits          2 days ago Essential (primary) hypertension   [facility] Birdie Sons, MD   2 months ago Shortness of breath   [facility] Birdie Sons, MD   2 months ago Hypocalcemia   [facility] Birdie Sons, MD   2 months ago Hypocalcemia   [facility] Birdie Sons, MD   3 months ago Cough, persistent   [facility] Birdie Sons, MD

## 2020-07-14 ENCOUNTER — Other Ambulatory Visit: Payer: Self-pay | Admitting: Family Medicine

## 2020-07-29 DIAGNOSIS — U071 COVID-19: Secondary | ICD-10-CM | POA: Diagnosis not present

## 2020-07-29 DIAGNOSIS — J9620 Acute and chronic respiratory failure, unspecified whether with hypoxia or hypercapnia: Secondary | ICD-10-CM | POA: Diagnosis not present

## 2020-08-05 ENCOUNTER — Other Ambulatory Visit: Payer: Self-pay | Admitting: Family Medicine

## 2020-08-05 NOTE — Telephone Encounter (Signed)
Requested medication (s) are due for refill today: yes  Requested medication (s) are on the active medication list: yes  Last refill:  04/20/20  Future visit scheduled: yes  Notes to clinic:  last prescribed at Hospital discharge   Requested Prescriptions  Pending Prescriptions Disp Refills   losartan (COZAAR) 50 MG tablet [Pharmacy Med Name: LOSARTAN 50MG  TABLETS] 90 tablet     Sig: TAKE 1 TABLET BY MOUTH DAILY      Cardiovascular:  Angiotensin Receptor Blockers Failed - 08/05/2020  2:34 PM      Failed - Cr in normal range and within 180 days    Creatinine, Ser  Date Value Ref Range Status  06/30/2020 1.64 (H) 0.76 - 1.27 mg/dL Final          Passed - K in normal range and within 180 days    Potassium  Date Value Ref Range Status  06/30/2020 3.7 3.5 - 5.2 mmol/L Final          Passed - Patient is not pregnant      Passed - Last BP in normal range    BP Readings from Last 1 Encounters:  06/30/20 129/72          Passed - Valid encounter within last 6 months    Recent Outpatient Visits           1 month ago Essential (primary) hypertension   [facility] Birdie Sons, MD   3 months ago Shortness of breath   [facility] Birdie Sons, MD   3 months ago Hypocalcemia   [facility] Birdie Sons, MD   3 months ago Hypocalcemia   [facility] Birdie Sons, MD   5 months ago Cough, persistent   [facility] Birdie Sons, MD       Future Appointments             In 2 months [name], [name], MD [facility], [place]

## 2020-08-08 ENCOUNTER — Telehealth: Payer: Self-pay | Admitting: Family Medicine

## 2020-08-08 DIAGNOSIS — I252 Old myocardial infarction: Secondary | ICD-10-CM | POA: Diagnosis not present

## 2020-08-08 DIAGNOSIS — R0602 Shortness of breath: Secondary | ICD-10-CM | POA: Diagnosis not present

## 2020-08-08 DIAGNOSIS — Z87891 Personal history of nicotine dependence: Secondary | ICD-10-CM | POA: Diagnosis not present

## 2020-08-08 DIAGNOSIS — Z955 Presence of coronary angioplasty implant and graft: Secondary | ICD-10-CM | POA: Diagnosis not present

## 2020-08-08 DIAGNOSIS — I10 Essential (primary) hypertension: Secondary | ICD-10-CM | POA: Diagnosis not present

## 2020-08-08 DIAGNOSIS — I219 Acute myocardial infarction, unspecified: Secondary | ICD-10-CM | POA: Diagnosis not present

## 2020-08-08 DIAGNOSIS — I2510 Atherosclerotic heart disease of native coronary artery without angina pectoris: Secondary | ICD-10-CM | POA: Diagnosis not present

## 2020-08-08 DIAGNOSIS — J449 Chronic obstructive pulmonary disease, unspecified: Secondary | ICD-10-CM | POA: Diagnosis not present

## 2020-08-08 DIAGNOSIS — Z7982 Long term (current) use of aspirin: Secondary | ICD-10-CM | POA: Diagnosis not present

## 2020-08-08 NOTE — Telephone Encounter (Signed)
Pt called in for assistance from his PCP. Pt says that he was went to the hospital earlier this morning for having a hard time breathing. Pt says that he was admitted and told that his enzymes are high. Pt says after being at the hospital for hours he now feels fine. The hospital discussed transferring pt to a different hospital that is able to do more for him, pt declined and checked himself out. Pt says that he doesn't need to stay in the hospital. Pt would like to discuss with provider if his enzymes being high a concern?    Pt says that he is on his way home and would like a call back from PCP or his assist.

## 2020-08-08 NOTE — Telephone Encounter (Signed)
I don't know what he's talking about. There are no labs in from [facility], [facility] or [facility], or any other records regarding a hospital visit.

## 2020-08-09 ENCOUNTER — Emergency Department: Payer: Medicare Other

## 2020-08-09 ENCOUNTER — Other Ambulatory Visit: Payer: Self-pay

## 2020-08-09 ENCOUNTER — Inpatient Hospital Stay
Admission: EM | Admit: 2020-08-09 | Discharge: 2020-08-11 | DRG: 313 | Disposition: A | Discharge status: Home / Self Care | Payer: Medicare Other | Attending: Internal Medicine | Admitting: Internal Medicine

## 2020-08-09 DIAGNOSIS — N1832 Chronic kidney disease, stage 3b: Secondary | ICD-10-CM | POA: Diagnosis present

## 2020-08-09 DIAGNOSIS — Z8249 Family history of ischemic heart disease and other diseases of the circulatory system: Secondary | ICD-10-CM

## 2020-08-09 DIAGNOSIS — Z8673 Personal history of transient ischemic attack (TIA), and cerebral infarction without residual deficits: Secondary | ICD-10-CM

## 2020-08-09 DIAGNOSIS — Z20822 Contact with and (suspected) exposure to covid-19: Secondary | ICD-10-CM | POA: Diagnosis present

## 2020-08-09 DIAGNOSIS — N179 Acute kidney failure, unspecified: Secondary | ICD-10-CM | POA: Diagnosis not present

## 2020-08-09 DIAGNOSIS — J441 Chronic obstructive pulmonary disease with (acute) exacerbation: Secondary | ICD-10-CM

## 2020-08-09 DIAGNOSIS — E7800 Pure hypercholesterolemia, unspecified: Secondary | ICD-10-CM | POA: Diagnosis present

## 2020-08-09 DIAGNOSIS — I130 Hypertensive heart and chronic kidney disease with heart failure and stage 1 through stage 4 chronic kidney disease, or unspecified chronic kidney disease: Secondary | ICD-10-CM | POA: Diagnosis present

## 2020-08-09 DIAGNOSIS — M940 Chondrocostal junction syndrome [Tietze]: Secondary | ICD-10-CM | POA: Diagnosis present

## 2020-08-09 DIAGNOSIS — Z9842 Cataract extraction status, left eye: Secondary | ICD-10-CM | POA: Diagnosis not present

## 2020-08-09 DIAGNOSIS — E785 Hyperlipidemia, unspecified: Secondary | ICD-10-CM | POA: Diagnosis not present

## 2020-08-09 DIAGNOSIS — R778 Other specified abnormalities of plasma proteins: Secondary | ICD-10-CM | POA: Diagnosis present

## 2020-08-09 DIAGNOSIS — I5022 Chronic systolic (congestive) heart failure: Secondary | ICD-10-CM | POA: Diagnosis not present

## 2020-08-09 DIAGNOSIS — Z8551 Personal history of malignant neoplasm of bladder: Secondary | ICD-10-CM | POA: Diagnosis not present

## 2020-08-09 DIAGNOSIS — I517 Cardiomegaly: Secondary | ICD-10-CM | POA: Diagnosis not present

## 2020-08-09 DIAGNOSIS — M1000 Idiopathic gout, unspecified site: Secondary | ICD-10-CM

## 2020-08-09 DIAGNOSIS — R0602 Shortness of breath: Secondary | ICD-10-CM | POA: Diagnosis not present

## 2020-08-09 DIAGNOSIS — K219 Gastro-esophageal reflux disease without esophagitis: Secondary | ICD-10-CM | POA: Diagnosis not present

## 2020-08-09 DIAGNOSIS — Z79899 Other long term (current) drug therapy: Secondary | ICD-10-CM

## 2020-08-09 DIAGNOSIS — Z8679 Personal history of other diseases of the circulatory system: Secondary | ICD-10-CM | POA: Diagnosis not present

## 2020-08-09 DIAGNOSIS — Z961 Presence of intraocular lens: Secondary | ICD-10-CM | POA: Diagnosis present

## 2020-08-09 DIAGNOSIS — N189 Chronic kidney disease, unspecified: Secondary | ICD-10-CM | POA: Diagnosis present

## 2020-08-09 DIAGNOSIS — Z8616 Personal history of COVID-19: Secondary | ICD-10-CM | POA: Diagnosis not present

## 2020-08-09 DIAGNOSIS — I2510 Atherosclerotic heart disease of native coronary artery without angina pectoris: Secondary | ICD-10-CM | POA: Diagnosis present

## 2020-08-09 DIAGNOSIS — I5020 Unspecified systolic (congestive) heart failure: Secondary | ICD-10-CM | POA: Diagnosis present

## 2020-08-09 DIAGNOSIS — I252 Old myocardial infarction: Secondary | ICD-10-CM | POA: Diagnosis not present

## 2020-08-09 DIAGNOSIS — Z8674 Personal history of sudden cardiac arrest: Secondary | ICD-10-CM

## 2020-08-09 DIAGNOSIS — R0789 Other chest pain: Principal | ICD-10-CM | POA: Diagnosis present

## 2020-08-09 DIAGNOSIS — Z955 Presence of coronary angioplasty implant and graft: Secondary | ICD-10-CM

## 2020-08-09 DIAGNOSIS — Z808 Family history of malignant neoplasm of other organs or systems: Secondary | ICD-10-CM

## 2020-08-09 DIAGNOSIS — E876 Hypokalemia: Secondary | ICD-10-CM | POA: Diagnosis not present

## 2020-08-09 DIAGNOSIS — Z8261 Family history of arthritis: Secondary | ICD-10-CM

## 2020-08-09 DIAGNOSIS — R079 Chest pain, unspecified: Secondary | ICD-10-CM | POA: Diagnosis present

## 2020-08-09 DIAGNOSIS — G4733 Obstructive sleep apnea (adult) (pediatric): Secondary | ICD-10-CM | POA: Diagnosis present

## 2020-08-09 DIAGNOSIS — I509 Heart failure, unspecified: Secondary | ICD-10-CM | POA: Diagnosis not present

## 2020-08-09 DIAGNOSIS — I255 Ischemic cardiomyopathy: Secondary | ICD-10-CM | POA: Diagnosis present

## 2020-08-09 DIAGNOSIS — Z85820 Personal history of malignant melanoma of skin: Secondary | ICD-10-CM

## 2020-08-09 DIAGNOSIS — I272 Pulmonary hypertension, unspecified: Secondary | ICD-10-CM | POA: Diagnosis present

## 2020-08-09 DIAGNOSIS — Z95810 Presence of automatic (implantable) cardiac defibrillator: Secondary | ICD-10-CM

## 2020-08-09 DIAGNOSIS — Z7982 Long term (current) use of aspirin: Secondary | ICD-10-CM

## 2020-08-09 DIAGNOSIS — M109 Gout, unspecified: Secondary | ICD-10-CM | POA: Diagnosis present

## 2020-08-09 DIAGNOSIS — Z87891 Personal history of nicotine dependence: Secondary | ICD-10-CM

## 2020-08-09 DIAGNOSIS — R7303 Prediabetes: Secondary | ICD-10-CM

## 2020-08-09 DIAGNOSIS — I10 Essential (primary) hypertension: Secondary | ICD-10-CM

## 2020-08-09 DIAGNOSIS — J90 Pleural effusion, not elsewhere classified: Secondary | ICD-10-CM | POA: Diagnosis not present

## 2020-08-09 DIAGNOSIS — Z951 Presence of aortocoronary bypass graft: Secondary | ICD-10-CM | POA: Diagnosis not present

## 2020-08-09 DIAGNOSIS — I4891 Unspecified atrial fibrillation: Secondary | ICD-10-CM | POA: Diagnosis not present

## 2020-08-09 DIAGNOSIS — Z7951 Long term (current) use of inhaled steroids: Secondary | ICD-10-CM

## 2020-08-09 DIAGNOSIS — J449 Chronic obstructive pulmonary disease, unspecified: Secondary | ICD-10-CM | POA: Diagnosis present

## 2020-08-09 DIAGNOSIS — Z9841 Cataract extraction status, right eye: Secondary | ICD-10-CM | POA: Diagnosis not present

## 2020-08-09 LAB — BASIC METABOLIC PANEL
Anion gap: 12 (ref 5–15)
BUN: 42 mg/dL — ABNORMAL HIGH (ref 8–23)
CO2: 25 mmol/L (ref 22–32)
Calcium: 9.3 mg/dL (ref 8.9–10.3)
Chloride: 100 mmol/L (ref 98–111)
Creatinine, Ser: 1.86 mg/dL — ABNORMAL HIGH (ref 0.61–1.24)
GFR calc Af Amer: 40 mL/min — ABNORMAL LOW (ref >60)
GFR calc non Af Amer: 34 mL/min — ABNORMAL LOW (ref >60)
Glucose, Bld: 190 mg/dL — ABNORMAL HIGH (ref 70–99)
Potassium: 3.6 mmol/L (ref 3.5–5.1)
Sodium: 137 mmol/L (ref 135–145)

## 2020-08-09 LAB — CBC
HCT: 42.3 % (ref 39.0–52.0)
Hemoglobin: 13.3 g/dL (ref 13.0–17.0)
MCH: 31.7 pg (ref 26.0–34.0)
MCHC: 31.4 g/dL (ref 30.0–36.0)
MCV: 101 fL — ABNORMAL HIGH (ref 80.0–100.0)
Platelets: 129 10*3/uL — ABNORMAL LOW (ref 150–400)
RBC: 4.19 MIL/uL — ABNORMAL LOW (ref 4.22–5.81)
RDW: 16.7 % — ABNORMAL HIGH (ref 11.5–15.5)
WBC: 11.8 10*3/uL — ABNORMAL HIGH (ref 4.0–10.5)
nRBC: 0.2 % (ref 0.0–0.2)

## 2020-08-09 LAB — TROPONIN I (HIGH SENSITIVITY)
Troponin I (High Sensitivity): 66 ng/L — ABNORMAL HIGH (ref <18)
Troponin I (High Sensitivity): 61 ng/L — ABNORMAL HIGH (ref <18)

## 2020-08-09 LAB — PROTIME-INR
INR: 1.3 — ABNORMAL HIGH (ref 0.8–1.2)
Prothrombin Time: 15.9 seconds — ABNORMAL HIGH (ref 11.4–15.2)

## 2020-08-09 MED ORDER — SODIUM CHLORIDE 0.9 % IV BOLUS
500.0000 mL | Freq: Once | INTRAVENOUS | Status: AC
  Administered 2020-08-10: 500 mL via INTRAVENOUS

## 2020-08-09 MED ORDER — ASPIRIN 81 MG PO CHEW
324.0000 mg | CHEWABLE_TABLET | Freq: Once | ORAL | Status: AC
  Administered 2020-08-09: 324 mg via ORAL
  Filled 2020-08-09: qty 4

## 2020-08-09 MED ORDER — NITROGLYCERIN 2 % TD OINT
1.0000 [in_us] | TOPICAL_OINTMENT | Freq: Once | TRANSDERMAL | Status: AC
  Administered 2020-08-09: 1 [in_us] via TOPICAL
  Filled 2020-08-09: qty 1

## 2020-08-09 MED ORDER — IPRATROPIUM-ALBUTEROL 0.5-2.5 (3) MG/3ML IN SOLN
3.0000 mL | Freq: Once | RESPIRATORY_TRACT | Status: AC
  Administered 2020-08-09: 3 mL via RESPIRATORY_TRACT
  Filled 2020-08-09: qty 3

## 2020-08-09 MED ORDER — METHYLPREDNISOLONE SODIUM SUCC 125 MG IJ SOLR
125.0000 mg | Freq: Once | INTRAMUSCULAR | Status: AC
  Administered 2020-08-09: 125 mg via INTRAVENOUS
  Filled 2020-08-09: qty 2

## 2020-08-09 NOTE — ED Triage Notes (Signed)
Pt comes into the ED with c/o chest pain with SOB since yesterday, states he was out of town and was at a hospital that he said they were trying to transfer him to [facility] so he left AMA, pt is tachypnic with audible wheezing.

## 2020-08-09 NOTE — Telephone Encounter (Signed)
Left message at both numbers for [place]k to return call with fax# so our office can send a request for pt recent visit note and lab results. TNP

## 2020-08-09 NOTE — Telephone Encounter (Signed)
Pt had labs done in [place], [place] at [facility]  / they also said he had a massive heart attack and was holding him to transfer to a hospital in [place] because he wasn't strong enough to go to [place] or [facility] / Pt called back to see what Dr, [name] Section wanted him to do/ Please call Pt asap

## 2020-08-09 NOTE — Telephone Encounter (Signed)
Nilda Simmer from our [facility] medical records is faxing a request for his hospital records. Awaiting records.

## 2020-08-09 NOTE — H&P (Signed)
History and Physical    Taylor [name] DOB: 14-Oct-1941 DOA: 08/09/2020  PCP: Birdie Sons, MD  Patient coming from: Home  I have personally briefly reviewed patient's old medical records in [place]  Chief Complaint: Chest pain, dyspnea  HPI: Taylor [name] is a 79 y.o. male with medical history significant for CAD s/p CABG, chronic systolic CHF (EF 37-90% by TTE 11/13/2019) s/p AICD, CKD stage III, AAA s/p endovascular repair 2008, HTN, HLD, and OSA on CPAP who presents to the ED for evaluation of dyspnea.  Patient states he was in [place], [place] when he was having difficulty sleeping.  He had new onset of shortness of breath.  He noticed his blood pressure was elevated therefore went to [facility] for further evaluation.  He was not having any chest pain at that time.  He says on the ED evaluation he was told that his cardiac enzymes were high and that he had a "massive heart attack."  He was given IV Lasix and he says he had good urine output of 1 and 1/2 urine collecting bottles.  He was told he was going to be transferred to a hospital in [place] for further management however he signed himself out to come to [facility] for further evaluation as this is where his primary cardiologist is located.  Patient states that he believes he had COVID-19 infection in October 2020 and since then he has had breathing issues.  He denies any prior known history of COPD or asthma.  He says he has not had any similar chest pain to his prior cardiac events however has had chest wall discomfort due to frequent coughing.  He has a continued nonproductive cough.  He says he had some nausea without emesis.  He denies any subjective fevers, chills, diaphoresis, abdominal pain, or dysuria.  ED Course:  Initial vitals showed BP 143/81, pulse 71, RR 28, temp 98.7 Fahrenheit, SPO2 98% on room air.  Labs show sodium 137, potassium 3.6, bicarb 25, BUN 42, creatinine 1.86, serum glucose  190, WBC 11.8, hemoglobin 13.3, platelets 129,000, high-sensitivity troponin I 66 > 61.  SARS-CoV-2 PCR is ordered and pending.  2 view chest x-ray shows stable patchy peripheral pulmonary opacities without acute focal pulmonary infiltrate, edema, or effusion.  Prior CABG changes and left-sided ICD noted.  Patient was given 500 cc normal saline, IV Solu-Medrol 125 mg, aspirin 324 mg, topical nitroglycerin, and DuoNeb treatment.  The hospitalist service was consulted to admit for further evaluation and management.  Review of Systems: All systems reviewed and are negative except as documented in history of present illness above.   Past Medical History:  Diagnosis Date  . AAA (abdominal aortic aneurysm) [facility]) 06/03/2007   [facility]; Dr. Kellie Simmering  . AICD (automatic cardioverter/defibrillator) present   . Barrett's esophagus   . Bladder cancer ([place])   . Bradycardia   . CAD (coronary artery disease)   . CHF (congestive heart failure) ([place])   . Cluster headache   . DDD (degenerative disc disease), lumbar   . Dyspnea    WITH EXERTION  . Edema    LEFT ANKLE  . Fracture of skull base ([place]) 1997   due to fall  . GERD (gastroesophageal reflux disease)   . Gout   . History of bladder cancer 12/1995  . Hyperlipidemia   . Hypertension   . Malignant melanoma ([place]) 12/2012   right dorsal forearm excised  . Myocardial infarction ([place])  LAST 2014  . Osteoarthritis of knee   . Pacemaker 10/10/2006  . Pneumonia    2016  . Pre-diabetes   . Psoriasis   . Rib fracture 1997   due to fall  . Sleep apnea    CPAP  . Stroke ([place])   . Venous incompetence     Past Surgical History:  Procedure Laterality Date  . ABDOMINAL AORTIC ANEURYSM REPAIR  06/03/2007   [facility]; Dr. Kellie Simmering  . ANGIOPLASTY  1994   MI  . BLADDER TUMOR EXCISION  12/1995  . CATARACT EXTRACTION W/PHACO Left 10/22/2016   Procedure: CATARACT EXTRACTION PHACO AND INTRAOCULAR LENS  PLACEMENT (IOC);  Surgeon: Birder Robson, MD;  Location: [facility] ORS;  Service: Ophthalmology;  Laterality: Left;  [place] 47.7 AP% 18.4 CDE 8.78 Fluid pack lot # [id number]  . CATARACT EXTRACTION W/PHACO Right 12/10/2016   Procedure: CATARACT EXTRACTION PHACO AND INTRAOCULAR LENS PLACEMENT (IOC);  Surgeon: Birder Robson, MD;  Location: [facility] ORS;  Service: Ophthalmology;  Laterality: Right;  [place] 00:39 AP% 23.3 CDE 9.13 Fluid pack lot # [id number]  . CORONARY ANGIOPLASTY     STENTS X 5  . CORONARY ARTERY BYPASS GRAFT  09/22/2006   four  . ELBOW BURSA SURGERY     DUE TO GOUT  . INSERT / REPLACE / REMOVE PACEMAKER    . MELANOMA EXCISION  12/2012   Right forearm    Social History:  reports that he quit smoking about 20 years ago. His smoking use included cigarettes. He smoked 1.00 pack per day. He has never used smokeless tobacco. He reports that he does not drink alcohol and does not use drugs.  Allergies  Allergen Reactions  . Amlodipine Besylate Swelling    Had a reaction when taking with colcrys   . Crestor [Rosuvastatin]     Muscle cramps and pain  . Rocephin [Ceftriaxone]     unknown    Family History  Problem Relation Age of Onset  . Cancer Mother        Melanoma skin cancer  . Heart attack Father 31  . Cancer Father        throat cancer  . Arthritis Brother      Prior to Admission medications   Medication Sig Start Date End Date Taking? Authorizing Provider  allopurinol (ZYLOPRIM) 300 MG tablet TAKE 2 TABLETS(600 MG) BY MOUTH DAILY Patient taking differently: Take 600 mg by mouth daily.  01/04/20   Birdie Sons, MD  aspirin 81 MG EC tablet Take 81 mg by mouth daily.     [provider]  atorvastatin (LIPITOR) 40 MG tablet TAKE 1 TABLET BY MOUTH EVERY DAY 07/14/20   Birdie Sons, MD  calcium carbonate (TUMS - DOSED IN MG ELEMENTAL CALCIUM) 500 MG chewable tablet Chew 1 tablet by mouth daily.    [provider]  carvedilol (COREG) 12.5 MG tablet TAKE  1 TABLET BY MOUTH DAILY Patient taking differently: Take 12.5 mg by mouth daily.  08/31/19   Birdie Sons, MD  COMBIVENT RESPIMAT 20-100 MCG/ACT AERS respimat SMARTSIG:2 Inhalation Via Inhaler 4 Times Daily PRN 12/09/19   [provider]  Cyanocobalamin (VITAMIN B-12 CR) 1000 MCG TBCR Take 1,000 mcg by mouth daily.     [provider]  fluticasone furoate-vilanterol (BREO ELLIPTA) 100-25 MCG/INH AEPB Inhale 1 puff into the lungs daily as needed. 06/30/20   Birdie Sons, MD  furosemide (LASIX) 20 MG tablet Take 20 mg by mouth.  [provider]  GLUCOSAMINE HCL PO Take 1,350 mg by mouth daily.    [provider]  glucose blood (ONETOUCH VERIO) test strip Use as instructed to check sugar daily 12/14/19   Birdie Sons, MD  hydrochlorothiazide (HYDRODIURIL) 25 MG tablet Take 1 tablet (25 mg total) by mouth daily. 05/01/20   Birdie Sons, MD  indomethacin (INDOCIN) 50 MG capsule Take 1 capsule (50 mg total) by mouth 3 (three) times daily as needed (for gout flares). 01/22/19   Birdie Sons, MD  losartan (COZAAR) 50 MG tablet TAKE 1 TABLET BY MOUTH DAILY 08/06/20   Birdie Sons, MD  magnesium oxide (MAG-OX) 400 MG tablet Take 400 mg by mouth daily.    [provider]  pantoprazole (PROTONIX) 40 MG tablet TAKE 1 TABLET BY MOUTH EVERY DAY 07/02/20   Birdie Sons, MD  triamcinolone ointment (KENALOG) 0.1 % Apply 1 application topically 2 (two) times daily. As directed    [provider]    Physical Exam: Vitals:   08/09/20 1826 08/09/20 1852 08/09/20 2219 08/10/20 0026  BP: (!) 140/101 (!) 148/106 (!) 172/88 (!) 151/102  Pulse: 69 63 70 73  Resp: (!) 22 (!) 24 (!) 24 (!) 29  Temp: 98.1 F (36.7 C) 98 F (36.7 C)    TempSrc: Oral Oral    SpO2: 97% 97% 98% 94%  Weight:      Height:       Constitutional: Resting in bed with head elevated, NAD, calm, comfortable Eyes: PERRL, lids and conjunctivae normal ENMT: Mucous membranes  are moist. Posterior pharynx clear of any exudate or lesions.Normal dentition.  Neck: normal, supple, no masses. Respiratory: Expiratory wheezing diffuse lung fields.  Normal respiratory effort. No accessory muscle use.  Cardiovascular: Regular rate and rhythm, no murmurs / rubs / gallops. No extremity edema. 2+ pedal pulses.  ICD in place left chest wall. Abdomen: no tenderness, no masses palpated. No hepatosplenomegaly. Bowel sounds positive.  Musculoskeletal: no clubbing / cyanosis. No joint deformity upper and lower extremities. Good ROM, no contractures. Normal muscle tone.  Skin: no rashes, lesions, ulcers. No induration Neurologic: CN 2-12 grossly intact. Sensation intact, Strength 5/5 in all 4.  Psychiatric: Normal judgment and insight. Alert and oriented x 3. Normal mood.    Labs on Admission: I have personally reviewed following labs and imaging studies  CBC: Recent Labs  Lab 08/09/20 1714  WBC 11.8*  HGB 13.3  HCT 42.3  MCV 101.0*  PLT 702*   Basic Metabolic Panel: Recent Labs  Lab 08/09/20 1714  NA 137  K 3.6  CL 100  CO2 25  GLUCOSE 190*  BUN 42*  CREATININE 1.86*  CALCIUM 9.3   GFR: Estimated Creatinine Clearance: 38.1 mL/min (A) (by C-G formula based on SCr of 1.86 mg/dL (H)). Liver Function Tests: No results for input(s): AST, ALT, ALKPHOS, BILITOT, PROT, ALBUMIN in the last 168 hours. No results for input(s): LIPASE, AMYLASE in the last 168 hours. No results for input(s): AMMONIA in the last 168 hours. Coagulation Profile: Recent Labs  Lab 08/09/20 1714  INR 1.3*   Cardiac Enzymes: No results for input(s): CKTOTAL, CKMB, CKMBINDEX, TROPONINI in the last 168 hours. BNP (last 3 results) No results for input(s): PROBNP in the last 8760 hours. HbA1C: No results for input(s): HGBA1C in the last 72 hours. CBG: No results for input(s): GLUCAP in the last 168 hours. Lipid Profile: No results for input(s): CHOL, HDL, LDLCALC, TRIG, CHOLHDL, LDLDIRECT  in the last 72 hours. Thyroid Function Tests: No results for input(s): TSH, T4TOTAL, FREET4, T3FREE, THYROIDAB in the last 72 hours. Anemia Panel: No results for input(s): VITAMINB12, FOLATE, FERRITIN, TIBC, IRON, RETICCTPCT in the last 72 hours. Urine analysis:    Component Value Date/Time   COLORURINE YELLOW (A) 11/22/2019 2134   APPEARANCEUR CLEAR (A) 11/22/2019 2134   LABSPEC 1.043 (H) 11/22/2019 2134   PHURINE 6.0 11/22/2019 2134   GLUCOSEU 50 (A) 11/22/2019 2134   HGBUR NEGATIVE 11/22/2019 2134   BILIRUBINUR NEGATIVE 11/22/2019 2134   BILIRUBINUR negative 03/31/2018 [place] 11/22/2019 2134   PROTEINUR 30 (A) 11/22/2019 2134   UROBILINOGEN 0.2 03/31/2018 1452   NITRITE NEGATIVE 11/22/2019 2134   LEUKOCYTESUR NEGATIVE 11/22/2019 2134    Radiological Exams on Admission: DG Chest 2 View  Result Date: 08/09/2020 CLINICAL DATA:  Chest pain EXAM: CHEST - 2 VIEW COMPARISON:  CT 11/12/2019, plain radiographs 02/09/2020 FINDINGS: Patchy peripheral pulmonary opacities are again identified and are grossly stable when accounting for relatively poor pulmonary insufflation possibly representing post inflammatory scarring given its stability over time. No new focal pulmonary infiltrate. No pneumothorax or pleural effusion. Mild to moderate cardiomegaly is stable. Coronary artery bypass grafting has been performed. Left subclavian pacemaker defibrillator is unchanged. Pulmonary vascularity is normal. No acute bone abnormality. IMPRESSION: 1. No acute cardiopulmonary findings. 2. Stable patchy peripheral pulmonary opacities, possibly post inflammatory scarring given its stability over time. Electronically Signed   By: Fidela Salisbury MD   On: 08/09/2020 18:00    EKG: Independently reviewed. Atrial paced rhythm with PVCs.  Similar to prior.  Assessment/Plan Principal Problem:   Chest pain Active Problems:   CAD (coronary artery disease)   Cardiac defibrillator in place    Systolic CHF with reduced left ventricular function, NYHA class 3 (HCC)   Hypercholesteremia   Obstructive sleep apnea   Essential (primary) hypertension   Acute kidney injury superimposed on CKD (HCC)  Taylor [name] is a 79 y.o. male with medical history significant for CAD s/p CABG, chronic systolic CHF (EF 94-85% by TTE 11/13/2019) s/p AICD, CKD stage III, AAA s/p endovascular repair 2008, HTN, HLD, and OSA on CPAP who is admitted for evaluation of atypical chest pain and dyspnea.  Atypical chest pain in setting of CAD s/p CABG: Patient with nonspecific musculoskeletal type chest pain in the setting of frequent coughing.  He was seen at an outside hospital in [place], [place] prior to presenting to [facility] ED and was told he had elevated cardiac enzymes and that he experienced a "massive heart attack."  Patient states he has not had any chest pain similar to his prior cardiac events. -Continue aspirin 81 mg daily -Continue to monitor on telemetry -Update echocardiogram -High-sensitivity troponin I minimally elevated at 66 with downward trend to 61 -Continue home Coreg and atorvastatin  Expiratory wheezing: Patient reports ongoing respiratory issues over the last year.  There is prior documentation of restrictive lung disease.  Has continued wheezing on admission but symptomatically improving after nebulizer treatment. -Continue scheduled DuoNeb, as needed albuterol -Continue IV Solu-Medrol 40 mg twice daily -Supplemental oxygen if needed  Chronic systolic CHF S/p AICD: Last EF 20-25% by TTE 11/13/2019.  Appears euvolemic and compensated on admission. -Update echocardiogram -Continue home Lasix 20 mg daily -Continue Coreg -Monitor strict I/O's and daily weights -Hold losartan with AKI  AKI on CKD stage III: Patient with variable renal function, slightly worsened compared to recent labs.  Does not appear volume  overloaded on admission.  Reportedly received IV diuresis at outside  hospital. -Hold home losartan and HCTZ -Hold further IV diuretics, resume home Lasix tomorrow -Check urine studies -Repeat labs in a.m.  Hypertension: Currently stable.  Continue Coreg.  Holding HCTZ and losartan as above.  Hyperlipidemia: Continue atorvastatin.  OSA: Continue CPAP nightly.  DVT prophylaxis: Lovenox Code Status: Full code, confirmed with patient Family Communication: Discussed with patient's wife at bedside Disposition Plan: From home likely discharge to home pending chest pain evaluation and improvement in respiratory symptoms Consults called: None Admission status:  Status is: Observation  The patient remains OBS appropriate and will d/c before 2 midnights.  Dispo: The patient is from: Home              Anticipated d/c is to: Home              Anticipated d/c date is: 1 day              Patient currently is not medically stable to d/c.   Zada Finders MD [facility]  If 7PM-7AM, please contact night-coverage [email or web address]  08/10/2020, 12:29 AM

## 2020-08-09 NOTE — ED Provider Notes (Signed)
[facility] Emergency Department Provider Note   ____________________________________________   First MD Initiated Contact with Patient 08/09/20 2305     (approximate)  I have reviewed the triage vital signs and the nursing notes.   HISTORY  Chief Complaint Chest Pain and Shortness of Breath    HPI Taylor [name] is a 79 y.o. male who presents to the ED from home with a chief complaint of chest pain and shortness of breath.  Patient reports he was out of town in [place] when he checked into the hospital for hypertension.  He was told he had a MI, given IV Solu-Medrol, a breathing treatment and told he would need to be transferred to [place] for further cardiac care.  Since patient lives in town and his cardiologist are here, he left AMA to come to our facility.  Also endorses nonproductive cough.  Denies fever, abdominal pain, nausea, vomiting or diarrhea.     Past Medical History:  Diagnosis Date  . AAA (abdominal aortic aneurysm) [facility]) 06/03/2007   [facility]; Dr. Kellie Simmering  . AICD (automatic cardioverter/defibrillator) present   . Barrett's esophagus   . Bladder cancer ([place])   . Bradycardia   . CAD (coronary artery disease)   . CHF (congestive heart failure) ([place])   . Cluster headache   . DDD (degenerative disc disease), lumbar   . Dyspnea    WITH EXERTION  . Edema    LEFT ANKLE  . Fracture of skull base ([place]) 1997   due to fall  . GERD (gastroesophageal reflux disease)   . Gout   . History of bladder cancer 12/1995  . Hyperlipidemia   . Hypertension   . Malignant melanoma ([place]) 12/2012   right dorsal forearm excised  . Myocardial infarction ([place])    LAST 2014  . Osteoarthritis of knee   . Pacemaker 10/10/2006  . Pneumonia    2016  . Pre-diabetes   . Psoriasis   . Rib fracture 1997   due to fall  . Sleep apnea    CPAP  . Stroke ([place])   . Venous incompetence     Patient Active Problem List   Diagnosis Date  Noted  . Hypomagnesemia 04/26/2020  . Hypocalcemia 04/19/2020  . Numbness and tingling in both hands 04/19/2020  . Atrial fibrillation ([place]) 04/19/2020  . Other specified interstitial pulmonary diseases ([place]) 12/06/2019  . Abdominal pain   . Suspected COVID-19 virus infection 11/22/2019  . Pancreatitis 11/22/2019  . Leukocytosis 11/22/2019  . CAP (community acquired pneumonia) 11/13/2019  . Hyperuricemia 03/31/2019  . Numbness 05/07/2018  . Benign colonic polyp 03/24/2017  . Tibialis anterior tenosynovitis 12/21/2015  . Pleural nodule 07/27/2015  . Psoriatic arthritis ([place]) 05/12/2015  . Restrictive lung disease 05/12/2015  . Barrett esophagus 03/30/2015  . Ache in joint 03/30/2015  . Bradycardia 03/30/2015  . Arteriosclerosis of coronary artery 03/30/2015  . Cardiac defibrillator in place 03/30/2015  . Chronic kidney disease (CKD), stage III (moderate) 03/30/2015  . Cluster headache syndrome 03/30/2015  . Degeneration of lumbar or lumbosacral intervertebral disc 03/30/2015  . Gout 03/30/2015  . Personal history of malignant neoplasm of bladder 03/30/2015  . Hypercholesteremia 03/30/2015  . LBP (low back pain) 03/30/2015  . Personal history of malignant melanoma of skin 03/30/2015  . Muscle ache 03/30/2015  . Arthritis of knee, degenerative 03/30/2015  . Prediabetes 03/30/2015  . Psoriasis 03/30/2015  . Obstructive sleep apnea 03/30/2015  . Chronic venous insufficiency 03/30/2015  . History  of abdominal aortic aneurysm (AAA) 06/24/2013  . History of CVA (cerebrovascular accident) 06/24/2013  . Cardiomyopathy, ischemic 06/24/2013  . History of AAA (abdominal aortic aneurysm) repair 06/24/2013  . Essential (primary) hypertension 08/17/2011  . Arrhythmia, sinus node 08/17/2011  . History of ventricular fibrillation 08/17/2011  . Systolic CHF with reduced left ventricular function, NYHA class 3 ([place]) 02/26/2010  . 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 Cardiac pacemaker in situ 10/10/2006    Past Surgical History:  Procedure Laterality Date  . ABDOMINAL AORTIC ANEURYSM REPAIR  06/03/2007   [facility]; Dr. Kellie Simmering  . ANGIOPLASTY  1994   MI  . BLADDER TUMOR EXCISION  12/1995  . CATARACT EXTRACTION W/PHACO Left 10/22/2016   Procedure: CATARACT EXTRACTION PHACO AND INTRAOCULAR LENS PLACEMENT (IOC);  Surgeon: Birder Robson, MD;  Location: [facility] ORS;  Service: Ophthalmology;  Laterality: Left;  [place] 47.7 AP% 18.4 CDE 8.78 Fluid pack lot # [id number]  . CATARACT EXTRACTION W/PHACO Right 12/10/2016   Procedure: CATARACT EXTRACTION PHACO AND INTRAOCULAR LENS PLACEMENT (IOC);  Surgeon: Birder Robson, MD;  Location: [facility] ORS;  Service: Ophthalmology;  Laterality: Right;  [place] 00:39 AP% 23.3 CDE 9.13 Fluid pack lot # [id number]  . CORONARY ANGIOPLASTY     STENTS X 5  . CORONARY ARTERY BYPASS GRAFT  09/22/2006   four  . ELBOW BURSA SURGERY     DUE TO GOUT  . INSERT / REPLACE / REMOVE PACEMAKER    . MELANOMA EXCISION  12/2012   Right forearm    Prior to Admission medications   Medication Sig Start Date End Date Taking? Authorizing Provider  allopurinol (ZYLOPRIM) 300 MG tablet TAKE 2 TABLETS(600 MG) BY MOUTH DAILY Patient taking differently: Take 600 mg by mouth daily.  01/04/20   Birdie Sons, MD  aspirin 81 MG EC tablet Take 81 mg by mouth daily.     [provider]  atorvastatin (LIPITOR) 40 MG tablet TAKE 1 TABLET BY MOUTH EVERY DAY 07/14/20   Birdie Sons, MD  calcium carbonate (TUMS - DOSED IN MG ELEMENTAL CALCIUM) 500 MG chewable tablet Chew 1 tablet by mouth daily.    [provider]  carvedilol (COREG) 12.5 MG tablet TAKE 1 TABLET BY MOUTH DAILY Patient taking differently: Take 12.5 mg by mouth daily.  08/31/19   Birdie Sons, MD  COMBIVENT RESPIMAT 20-100 MCG/ACT AERS respimat SMARTSIG:2 Inhalation Via Inhaler 4 Times Daily PRN 12/09/19   [provider]   Cyanocobalamin (VITAMIN B-12 CR) 1000 MCG TBCR Take 1,000 mcg by mouth daily.     [provider]  fluticasone furoate-vilanterol (BREO ELLIPTA) 100-25 MCG/INH AEPB Inhale 1 puff into the lungs daily as needed. 06/30/20   Birdie Sons, MD  furosemide (LASIX) 20 MG tablet Take 20 mg by mouth.    [provider]  GLUCOSAMINE HCL PO Take 1,350 mg by mouth daily.    [provider]  glucose blood (ONETOUCH VERIO) test strip Use as instructed to check sugar daily 12/14/19   Birdie Sons, MD  hydrochlorothiazide (HYDRODIURIL) 25 MG tablet Take 1 tablet (25 mg total) by mouth daily. 05/01/20   Birdie Sons, MD  indomethacin (INDOCIN) 50 MG capsule Take 1 capsule (50 mg total) by mouth 3 (three) times daily as needed (for gout flares). 01/22/19   Birdie Sons, MD  losartan (COZAAR) 50 MG tablet TAKE 1 TABLET BY MOUTH DAILY 08/06/20   Birdie Sons, MD  magnesium oxide (MAG-OX) 400 MG tablet Take 400 mg by mouth daily.    [provider]  pantoprazole (PROTONIX) 40 MG tablet TAKE 1 TABLET BY MOUTH EVERY DAY 07/02/20   Birdie Sons, MD  triamcinolone ointment (KENALOG) 0.1 % Apply 1 application topically 2 (two) times daily. As directed    [provider]    Allergies Amlodipine besylate, Crestor [rosuvastatin], and Rocephin [ceftriaxone]  Family History  Problem Relation Age of Onset  . Cancer Mother        Melanoma skin cancer  . Heart attack Father 65  . Cancer Father        throat cancer  . Arthritis Brother     Social History Social History   Tobacco Use  . Smoking status: Former Smoker    Packs/day: 1.00    Types: Cigarettes    Quit date: 11/26/1999    Years since quitting: 20.7  . Smokeless tobacco: Never Used  Vaping Use  . Vaping Use: Never used  Substance Use Topics  . Alcohol use: No  . Drug use: No    Review of Systems  Constitutional: No fever/chills Eyes: No visual changes. ENT: No sore  throat. Cardiovascular: Positive for chest pain. Respiratory: Positive for cough and shortness of breath. Gastrointestinal: No abdominal pain.  No nausea, no vomiting.  No diarrhea.  No constipation. Genitourinary: Negative for dysuria. Musculoskeletal: Negative for back pain. Skin: Negative for rash. Neurological: Negative for headaches, focal weakness or numbness.   ____________________________________________   PHYSICAL EXAM:  VITAL SIGNS: ED Triage Vitals  Enc Vitals Group     BP 08/09/20 1708 (!) 143/81     Pulse Rate 08/09/20 1708 71     Resp 08/09/20 1708 (!) 28     Temp 08/09/20 1708 98.7 F (37.1 C)     Temp Source 08/09/20 1708 Oral     SpO2 08/09/20 1708 98 %     Weight 08/09/20 1711 205 lb (93 kg)     Height 08/09/20 1711 5\' 10"  (1.778 m)     Head Circumference --      Peak Flow --      Pain Score 08/09/20 1716 7     Pain Loc --      Pain Edu? --      Excl. in [place]? --     Constitutional: Alert and oriented. Well appearing and in no acute distress. Eyes: Conjunctivae are normal. PERRL. EOMI. Head: Atraumatic. Nose: No congestion/rhinnorhea. Mouth/Throat: Mucous membranes are mildly dry.   Neck: No stridor.   Cardiovascular: Normal rate, regular rhythm. Grossly normal heart sounds.  Good peripheral circulation. Respiratory: Increased respiratory effort.  No retractions. Lungs diminished bilaterally. Gastrointestinal: Soft and nontender. No distention. No abdominal bruits. No CVA tenderness. Musculoskeletal: No lower extremity tenderness.  1+ BLE nonpitting edema.  No joint effusions. Neurologic:  Normal speech and language. No gross focal neurologic deficits are appreciated.  Skin:  Skin is warm, dry and intact. No rash noted. Psychiatric: Mood and affect are normal. Speech and behavior are normal.  ____________________________________________   LABS (all labs ordered are listed, but only abnormal results are displayed)  Labs Reviewed  BASIC METABOLIC  PANEL - Abnormal; Notable for the following components:      Result Value   Glucose, Bld 190 (*)    BUN 42 (*)    Creatinine, Ser 1.86 (*)    GFR calc non Af Amer 34 (*)    GFR calc Af Amer 40 (*)  All other components within normal limits  CBC - Abnormal; Notable for the following components:   WBC 11.8 (*)    RBC 4.19 (*)    MCV 101.0 (*)    RDW 16.7 (*)    Platelets 129 (*)    All other components within normal limits  PROTIME-INR - Abnormal; Notable for the following components:   Prothrombin Time 15.9 (*)    INR 1.3 (*)    All other components within normal limits  TROPONIN I (HIGH SENSITIVITY) - Abnormal; Notable for the following components:   Troponin I (High Sensitivity) 66 (*)    All other components within normal limits  TROPONIN I (HIGH SENSITIVITY) - Abnormal; Notable for the following components:   Troponin I (High Sensitivity) 61 (*)    All other components within normal limits  SARS CORONAVIRUS 2 BY RT PCR (HOSPITAL ORDER, [facility])   ____________________________________________  EKG  ED ECG REPORT I, Lot Medford J, the attending physician, personally viewed and interpreted this ECG.   Date: 08/09/2020  EKG Time: 1706  Rate: 72  Rhythm: Paced rhythm  Axis: LAD  Intervals:none  ST&T Change: Nonspecific  ____________________________________________  RADIOLOGY  ED MD interpretation: No acute cardiopulmonary process  Official radiology report(s): DG Chest 2 View  Result Date: 08/09/2020 CLINICAL DATA:  Chest pain EXAM: CHEST - 2 VIEW COMPARISON:  CT 11/12/2019, plain radiographs 02/09/2020 FINDINGS: Patchy peripheral pulmonary opacities are again identified and are grossly stable when accounting for relatively poor pulmonary insufflation possibly representing post inflammatory scarring given its stability over time. No new focal pulmonary infiltrate. No pneumothorax or pleural effusion. Mild to moderate cardiomegaly is  stable. Coronary artery bypass grafting has been performed. Left subclavian pacemaker defibrillator is unchanged. Pulmonary vascularity is normal. No acute bone abnormality. IMPRESSION: 1. No acute cardiopulmonary findings. 2. Stable patchy peripheral pulmonary opacities, possibly post inflammatory scarring given its stability over time. Electronically Signed   By: Fidela Salisbury MD   On: 08/09/2020 18:00    ____________________________________________   PROCEDURES  Procedure(s) performed (including Critical Care):  .1-3 Lead EKG Interpretation Performed by: Paulette Blanch, MD Authorized by: Paulette Blanch, MD     Interpretation: normal     ECG rate:  75   ECG rate assessment: normal     Rhythm: sinus rhythm     Ectopy: none     Conduction: normal   Comments:     Patient placed on cardiac monitor to evaluate for arrhythmias     ____________________________________________   INITIAL IMPRESSION / ASSESSMENT AND PLAN / ED COURSE  As part of my medical decision making, I reviewed the following data within the electronic MEDICAL RECORD NUMBER History obtained from family, Nursing notes reviewed and incorporated, Labs reviewed, EKG interpreted, Old chart reviewed, Radiograph reviewed, Discussed with admitting physician and Notes from prior ED visits        79 year old male with past history of MI status post CABG, atrial fibrillation not on anticoagulation, COPD presenting with chest pain and shortness of breath. Differential diagnosis includes, but is not limited to, ACS, aortic dissection, pulmonary embolism, cardiac tamponade, pneumothorax, pneumonia, pericarditis, myocarditis, GI-related causes including esophagitis/gastritis, and musculoskeletal chest wall pain.    Troponins elevated in the 60s range.  Blood pressure elevated.  AKI noted.  Will administer aspirin, nitroglycerin paste, IV Solu-Medrol, DuoNeb.  Will discuss with hospitalist services for admission.       ____________________________________________   FINAL CLINICAL IMPRESSION(S) / ED DIAGNOSES  Final diagnoses:  Essential hypertension  Chest pain, unspecified type  Elevated troponin  AKI (acute kidney injury) ([place])  COPD exacerbation [facility])     ED Discharge Orders    None      *Please note:  RENNE PLATTS was evaluated in Emergency Department on 08/09/2020 for the symptoms described in the history of present illness. He was evaluated in the context of the global COVID-19 pandemic, which necessitated consideration that the patient might be at risk for infection with the SARS-CoV-2 virus that causes COVID-19. Institutional protocols and algorithms that pertain to the evaluation of patients at risk for COVID-19 are in a state of rapid change based on information released by regulatory bodies including the CDC and federal and state organizations. These policies and algorithms were followed during the patient's care in the ED.  Some ED evaluations and interventions may be delayed as a result of limited staffing during and the pandemic.*   Note:  This document was prepared using Dragon voice recognition software and may include unintentional dictation errors.   Paulette Blanch, MD 08/10/20 [phone]

## 2020-08-09 NOTE — Telephone Encounter (Signed)
Is he in the hospital? If he was told he had a massive heart attack he needs to be in the hospital. Please get more information from the patient.

## 2020-08-09 NOTE — Telephone Encounter (Signed)
Patient advised as below and verbalized understanding. Patient agrees to go to the nearest ER now.

## 2020-08-09 NOTE — ED Notes (Signed)
[place] sent to the lab.  

## 2020-08-09 NOTE — Telephone Encounter (Signed)
I've not seen any records yet, but advise the patient if they told the patient he had a massive heart attack, then he needs to be in the hospital. If he's not in the hospital then he needs to go to the nearest one immediately.

## 2020-08-10 ENCOUNTER — Other Ambulatory Visit: Payer: Self-pay

## 2020-08-10 ENCOUNTER — Encounter: Payer: Self-pay | Admitting: Internal Medicine

## 2020-08-10 ENCOUNTER — Observation Stay
Admit: 2020-08-10 | Discharge: 2020-08-10 | Disposition: A | Discharge status: Home / Self Care | Payer: Medicare Other | Attending: Internal Medicine | Admitting: Internal Medicine

## 2020-08-10 DIAGNOSIS — N179 Acute kidney failure, unspecified: Secondary | ICD-10-CM | POA: Diagnosis present

## 2020-08-10 DIAGNOSIS — R0600 Dyspnea, unspecified: Secondary | ICD-10-CM

## 2020-08-10 DIAGNOSIS — N189 Chronic kidney disease, unspecified: Secondary | ICD-10-CM

## 2020-08-10 LAB — ECHOCARDIOGRAM COMPLETE
AR max vel: 1.72 cm2
AV Area VTI: 1.8 cm2
AV Area mean vel: 1.79 cm2
AV Mean grad: 7 mmHg
AV Peak grad: 16.3 mmHg
Ao pk vel: 2.02 m/s
Area-P 1/2: 3.72 cm2
Height: 70 in
S' Lateral: 5.08 cm
Weight: 3280 oz

## 2020-08-10 LAB — BASIC METABOLIC PANEL
Anion gap: 8 (ref 5–15)
BUN: 41 mg/dL — ABNORMAL HIGH (ref 8–23)
CO2: 27 mmol/L (ref 22–32)
Calcium: 8.9 mg/dL (ref 8.9–10.3)
Chloride: 103 mmol/L (ref 98–111)
Creatinine, Ser: 1.75 mg/dL — ABNORMAL HIGH (ref 0.61–1.24)
GFR calc Af Amer: 43 mL/min — ABNORMAL LOW (ref >60)
GFR calc non Af Amer: 37 mL/min — ABNORMAL LOW (ref >60)
Glucose, Bld: 195 mg/dL — ABNORMAL HIGH (ref 70–99)
Potassium: 3.8 mmol/L (ref 3.5–5.1)
Sodium: 138 mmol/L (ref 135–145)

## 2020-08-10 LAB — URINALYSIS, ROUTINE W REFLEX MICROSCOPIC
Bilirubin Urine: NEGATIVE
Glucose, UA: NEGATIVE mg/dL
Hgb urine dipstick: NEGATIVE
Ketones, ur: NEGATIVE mg/dL
Leukocytes,Ua: NEGATIVE
Nitrite: NEGATIVE
Protein, ur: NEGATIVE mg/dL
Specific Gravity, Urine: 1.02 (ref 1.005–1.030)
pH: 5 (ref 5.0–8.0)

## 2020-08-10 LAB — CBC
HCT: 42.5 % (ref 39.0–52.0)
Hemoglobin: 13.8 g/dL (ref 13.0–17.0)
MCH: 32.2 pg (ref 26.0–34.0)
MCHC: 32.5 g/dL (ref 30.0–36.0)
MCV: 99.3 fL (ref 80.0–100.0)
Platelets: 122 10*3/uL — ABNORMAL LOW (ref 150–400)
RBC: 4.28 MIL/uL (ref 4.22–5.81)
RDW: 16.7 % — ABNORMAL HIGH (ref 11.5–15.5)
WBC: 11.5 10*3/uL — ABNORMAL HIGH (ref 4.0–10.5)
nRBC: 0 % (ref 0.0–0.2)

## 2020-08-10 LAB — SARS CORONAVIRUS 2 BY RT PCR (HOSPITAL ORDER, PERFORMED IN ~~LOC~~ HOSPITAL LAB): SARS Coronavirus 2: NEGATIVE

## 2020-08-10 LAB — CREATININE, URINE, RANDOM: Creatinine, Urine: 121 mg/dL

## 2020-08-10 LAB — SODIUM, URINE, RANDOM: Sodium, Ur: <10 mmol/L

## 2020-08-10 MED ORDER — CALCIUM CARBONATE ANTACID 500 MG PO CHEW
1.0000 | CHEWABLE_TABLET | Freq: Every day | ORAL | Status: DC
  Administered 2020-08-11: 200 mg via ORAL
  Filled 2020-08-10: qty 1

## 2020-08-10 MED ORDER — FUROSEMIDE 20 MG PO TABS
20.0000 mg | ORAL_TABLET | Freq: Every day | ORAL | Status: DC
  Administered 2020-08-10 – 2020-08-11 (×2): 20 mg via ORAL
  Filled 2020-08-10 (×2): qty 1

## 2020-08-10 MED ORDER — METHYLPREDNISOLONE SODIUM SUCC 40 MG IJ SOLR
40.0000 mg | Freq: Two times a day (BID) | INTRAMUSCULAR | Status: DC
  Administered 2020-08-10: 40 mg via INTRAVENOUS
  Filled 2020-08-10: qty 1

## 2020-08-10 MED ORDER — PERFLUTREN LIPID MICROSPHERE
1.0000 mL | INTRAVENOUS | Status: AC | PRN
  Administered 2020-08-10: 2 mL via INTRAVENOUS
  Filled 2020-08-10: qty 10

## 2020-08-10 MED ORDER — IPRATROPIUM-ALBUTEROL 0.5-2.5 (3) MG/3ML IN SOLN
3.0000 mL | Freq: Three times a day (TID) | RESPIRATORY_TRACT | Status: DC
  Administered 2020-08-10 (×3): 3 mL via RESPIRATORY_TRACT
  Filled 2020-08-10 (×3): qty 3

## 2020-08-10 MED ORDER — ENOXAPARIN SODIUM 40 MG/0.4ML ~~LOC~~ SOLN
40.0000 mg | SUBCUTANEOUS | Status: DC
  Administered 2020-08-10 – 2020-08-11 (×2): 40 mg via SUBCUTANEOUS
  Filled 2020-08-10 (×2): qty 0.4

## 2020-08-10 MED ORDER — MAGNESIUM OXIDE 400 MG PO TABS
800.0000 mg | ORAL_TABLET | Freq: Two times a day (BID) | ORAL | Status: DC
  Administered 2020-08-11: 800 mg via ORAL
  Filled 2020-08-10 (×3): qty 2

## 2020-08-10 MED ORDER — ONDANSETRON HCL 4 MG/2ML IJ SOLN: 4.0000 mg | Freq: Four times a day (QID) | INTRAMUSCULAR | Status: DC | PRN

## 2020-08-10 MED ORDER — GUAIFENESIN ER 600 MG PO TB12
600.0000 mg | ORAL_TABLET | Freq: Two times a day (BID) | ORAL | Status: DC
  Administered 2020-08-10 – 2020-08-11 (×4): 600 mg via ORAL
  Filled 2020-08-10 (×5): qty 1

## 2020-08-10 MED ORDER — CARVEDILOL 12.5 MG PO TABS
12.5000 mg | ORAL_TABLET | Freq: Every day | ORAL | Status: DC
  Administered 2020-08-10 – 2020-08-11 (×2): 12.5 mg via ORAL
  Filled 2020-08-10: qty 2
  Filled 2020-08-10: qty 1
  Filled 2020-08-10: qty 2

## 2020-08-10 MED ORDER — VITAMIN B-12 1000 MCG PO TABS
1000.0000 ug | ORAL_TABLET | Freq: Every day | ORAL | Status: DC
  Administered 2020-08-10 – 2020-08-11 (×2): 1000 ug via ORAL
  Filled 2020-08-10 (×2): qty 1

## 2020-08-10 MED ORDER — ACETAMINOPHEN 325 MG PO TABS
650.0000 mg | ORAL_TABLET | ORAL | Status: DC | PRN
  Administered 2020-08-10: 650 mg via ORAL
  Filled 2020-08-10: qty 2

## 2020-08-10 MED ORDER — PANTOPRAZOLE SODIUM 40 MG PO TBEC
40.0000 mg | DELAYED_RELEASE_TABLET | Freq: Every day | ORAL | Status: DC
  Administered 2020-08-10 – 2020-08-11 (×2): 40 mg via ORAL
  Filled 2020-08-10 (×2): qty 1

## 2020-08-10 MED ORDER — ATORVASTATIN CALCIUM 20 MG PO TABS
40.0000 mg | ORAL_TABLET | Freq: Every day | ORAL | Status: DC
  Administered 2020-08-10: 40 mg via ORAL
  Filled 2020-08-10: qty 2

## 2020-08-10 MED ORDER — ALBUTEROL SULFATE (2.5 MG/3ML) 0.083% IN NEBU: 2.5000 mg | INHALATION_SOLUTION | RESPIRATORY_TRACT | Status: DC | PRN

## 2020-08-10 MED ORDER — LOSARTAN POTASSIUM 50 MG PO TABS
50.0000 mg | ORAL_TABLET | Freq: Every day | ORAL | Status: DC
  Administered 2020-08-10 – 2020-08-11 (×2): 50 mg via ORAL
  Filled 2020-08-10 (×2): qty 1

## 2020-08-10 MED ORDER — ALLOPURINOL 300 MG PO TABS
600.0000 mg | ORAL_TABLET | Freq: Every day | ORAL | Status: DC
  Administered 2020-08-10 – 2020-08-11 (×2): 600 mg via ORAL
  Filled 2020-08-10: qty 6
  Filled 2020-08-10 (×2): qty 2
  Filled 2020-08-10: qty 6

## 2020-08-10 MED ORDER — ASPIRIN EC 81 MG PO TBEC
81.0000 mg | DELAYED_RELEASE_TABLET | Freq: Every day | ORAL | Status: DC
  Administered 2020-08-10 – 2020-08-11 (×2): 81 mg via ORAL
  Filled 2020-08-10 (×2): qty 1

## 2020-08-10 MED ORDER — TRAMADOL HCL 50 MG PO TABS
50.0000 mg | ORAL_TABLET | Freq: Once | ORAL | Status: AC
  Administered 2020-08-10: 50 mg via ORAL
  Filled 2020-08-10 (×2): qty 1

## 2020-08-10 NOTE — ED Notes (Signed)
[facility] contacted about pt's BP.

## 2020-08-10 NOTE — ED Notes (Signed)
Pt still having CP post tylenol, messaged MD. Awaiting response.

## 2020-08-10 NOTE — Progress Notes (Signed)
Progress Note    Taylor [name]  QMG:[id number] DOB: 04/28/1941  DOA: 08/09/2020 PCP: Birdie Sons, MD      Brief Narrative:    Medical records reviewed and are as summarized below:  Taylor [name] is a 79 y.o. male       Assessment/Plan:   Principal Problem:   Chest pain Active Problems:   CAD (coronary artery disease)   Cardiac defibrillator in place   Systolic CHF with reduced left ventricular function, NYHA class 3 (HCC)   Hypercholesteremia   Obstructive sleep apnea   Essential (primary) hypertension   Acute kidney injury superimposed on CKD ([place])   Body mass index is 29.41 kg/m.   Elevated troponins (66, 61) CKD stage IIIb Doubt AKI.  Creatinine on 05/03/2020 was 2.05 and creatinine on 06/30/2020 was 1.64.  PLAN  Continue aspirin and Lipitor Consulted cardiologist for further evaluation of chest pain and elevated troponins. I have asked his nurse, Joellen Jersey, to obtain medical records from [facility]. Continue Lasix for CHF Discontinue IV steroids and monitor respiratory status Resume losartan for hypertension Monitor creatinine closely. Follow-up with cardiologist for further recommendations.   Diet Order            Diet Heart Room service appropriate? Yes; Fluid consistency: Thin  Diet effective now                    Consultants:  Cardiologist  Procedures:  None    Medications:    aspirin EC  81 mg Oral Daily   atorvastatin  40 mg Oral q1800   carvedilol  12.5 mg Oral Daily   enoxaparin (LOVENOX) injection  40 mg Subcutaneous Q24H   furosemide  20 mg Oral Daily   guaiFENesin  600 mg Oral BID   ipratropium-albuterol  3 mL Nebulization Q8H   methylPREDNISolone (SOLU-MEDROL) injection  40 mg Intravenous Q12H   Continuous Infusions:   Anti-infectives (From admission, onward)   None             Family Communication/Anticipated D/C date and plan/Code Status   DVT prophylaxis: enoxaparin  (LOVENOX) injection 40 mg Start: 08/10/20 1000     Code Status: Full Code  Family Communication: Plan discussed with patient Disposition Plan:    Status is: Observation  The patient remains OBS appropriate and will d/c before 2 midnights.  Dispo: The patient is from: Home              Anticipated d/c is to: Home              Anticipated d/c date is: 1 day              Patient currently is not medically stable to d/c.           Subjective:   C/o cough and chest pain that is worse with coughing.  He said he went to [facility] in [place] and he was told he had had a "massive heart attack".  He said he was going to be referred from [facility] to [place] but he signed himself out [place] and presented to [facility] because his cardiologist is here.  He feels his throat is a little scratchy.  No other complaints.  No shortness of breath, fever, chills, vomiting or diarrhea.  Objective:    Vitals:   08/10/20 0940 08/10/20 1130 08/10/20 1400 08/10/20 1600  BP:  126/89 (!) 151/98 (!) 140/100  Pulse:  64 71 64  Resp:  (!) 22  19  Temp: 97.9 F (36.6 C)     TempSrc: Oral     SpO2:  90% 93% 91%  Weight:      Height:       No data found.   Intake/Output Summary (Last 24 hours) at 08/10/2020 1618 Last data filed at 08/10/2020 0711 Gross per 24 hour  Intake 500 ml  Output 420 ml  Net 80 ml   Filed Weights   08/09/20 1711  Weight: 93 kg    Exam:  GEN: NAD SKIN: No rash EYES: EOMI ENT: MMM CV: RRR PULM: CTA B ABD: soft, protuberant, NT, +BS CNS: AAO x 3, non focal EXT: Bilateral leg edema (left greater than right), no tenderness   Data Reviewed:   I have personally reviewed following labs and imaging studies:  Labs: Labs show the following:   Basic Metabolic Panel: Recent Labs  Lab 08/09/20 1714 08/10/20 0328  NA 137 138  K 3.6 3.8  CL 100 103  CO2 25 27  GLUCOSE 190* 195*  BUN 42* 41*   CREATININE 1.86* 1.75*  CALCIUM 9.3 8.9   GFR Estimated Creatinine Clearance: 40.5 mL/min (A) (by C-G formula based on SCr of 1.75 mg/dL (H)). Liver Function Tests: No results for input(s): AST, ALT, ALKPHOS, BILITOT, PROT, ALBUMIN in the last 168 hours. No results for input(s): LIPASE, AMYLASE in the last 168 hours. No results for input(s): AMMONIA in the last 168 hours. Coagulation profile Recent Labs  Lab 08/09/20 1714  INR 1.3*    CBC: Recent Labs  Lab 08/09/20 1714 08/10/20 0328  WBC 11.8* 11.5*  HGB 13.3 13.8  HCT 42.3 42.5  MCV 101.0* 99.3  PLT 129* 122*   Cardiac Enzymes: No results for input(s): CKTOTAL, CKMB, CKMBINDEX, TROPONINI in the last 168 hours. BNP (last 3 results) No results for input(s): PROBNP in the last 8760 hours. CBG: No results for input(s): GLUCAP in the last 168 hours. D-Dimer: No results for input(s): DDIMER in the last 72 hours. Hgb A1c: No results for input(s): HGBA1C in the last 72 hours. Lipid Profile: No results for input(s): CHOL, HDL, LDLCALC, TRIG, CHOLHDL, LDLDIRECT in the last 72 hours. Thyroid function studies: No results for input(s): TSH, T4TOTAL, T3FREE, THYROIDAB in the last 72 hours.  Invalid input(s): FREET3 Anemia work up: No results for input(s): VITAMINB12, FOLATE, FERRITIN, TIBC, IRON, RETICCTPCT in the last 72 hours. Sepsis Labs: Recent Labs  Lab 08/09/20 1714 08/10/20 0328  WBC 11.8* 11.5*    Microbiology Recent Results (from the past 240 hour(s))  SARS Coronavirus 2 by RT PCR (hospital order, performed in [facility] hospital lab) Nasopharyngeal Nasopharyngeal Swab     Status: None   Collection Time: 08/10/20 12:23 AM   Specimen: Nasopharyngeal Swab  Result Value Ref Range Status   SARS Coronavirus 2 NEGATIVE NEGATIVE Final    Comment: (NOTE) SARS-CoV-2 target nucleic acids are NOT DETECTED.  The SARS-CoV-2 RNA is generally detectable in upper and lower respiratory specimens during the acute phase  of infection. The lowest concentration of SARS-CoV-2 viral copies this assay can detect is 250 copies / mL. A negative result does not preclude SARS-CoV-2 infection and should not be used as the sole basis for treatment or other patient management decisions.  A negative result may occur with improper specimen collection / handling, submission of specimen other than nasopharyngeal swab, presence of viral mutation(s) within the areas targeted by this assay, and  inadequate number of viral copies (<250 copies / mL). A negative result must be combined with clinical observations, patient history, and epidemiological information.  Fact Sheet for Patients:   StrictlyIdeas.no  Fact Sheet for Healthcare Providers: BankingDealers.co.za  This test is not yet approved or  cleared by the [place] FDA and has been authorized for detection and/or diagnosis of SARS-CoV-2 by FDA under an Emergency Use Authorization (EUA).  This EUA will remain in effect (meaning this test can be used) for the duration of the COVID-19 declaration under Section 564(b)(1) of the Act, 21 U.S.C. section 360bbb-3(b)(1), unless the authorization is terminated or revoked sooner.  Performed at [facility], [address] [place], [place] [address]     Procedures and diagnostic studies:  DG Chest 2 View  Result Date: 08/09/2020 CLINICAL DATA:  Chest pain EXAM: CHEST - 2 VIEW COMPARISON:  CT 11/12/2019, plain radiographs 02/09/2020 FINDINGS: Patchy peripheral pulmonary opacities are again identified and are grossly stable when accounting for relatively poor pulmonary insufflation possibly representing post inflammatory scarring given its stability over time. No new focal pulmonary infiltrate. No pneumothorax or pleural effusion. Mild to moderate cardiomegaly is stable. Coronary artery bypass grafting has been performed. Left subclavian pacemaker defibrillator is  unchanged. Pulmonary vascularity is normal. No acute bone abnormality. IMPRESSION: 1. No acute cardiopulmonary findings. 2. Stable patchy peripheral pulmonary opacities, possibly post inflammatory scarring given its stability over time. Electronically Signed   By: Fidela Salisbury MD   On: 08/09/2020 18:00   ECHOCARDIOGRAM COMPLETE  Result Date: 08/10/2020    ECHOCARDIOGRAM REPORT   Patient Name:   Taylor [name] Date of Exam: 08/10/2020 Medical Rec #:  [id number]    Height:       70.0 in Accession #:    [id number]   Weight:       205.0 lb Date of Birth:  08/01/1941   BSA:          2.109 m Patient Age:    53 years     BP:           157/126 mmHg Patient Gender: M            HR:           76 bpm. Exam Location:  [facility] Procedure: 2D Echo, Color Doppler, Cardiac Doppler and Intracardiac            Opacification Agent Indications:     R06.00 Dyspnea  History:         Patient has prior history of Echocardiogram examinations.                  Previous Myocardial Infarction, Defibrillator and Prior CABG,                  Stroke; Risk Factors:Hypertension, Dyslipidemia and Former                  Smoker. Pt had covid-19 in 10/2019.  Sonographer:     Charmayne Sheer RDCS (AE) Referring Phys:  [id number] Cleaster Corin PATEL Diagnosing Phys: Bartholome Bill MD  Sonographer Comments: Technically difficult study due to poor echo windows. Image acquisition challenging due to patient body habitus. IMPRESSIONS  1. Left ventricular ejection fraction, by estimation, is 35 to 40%. The left ventricle has moderately decreased function. The left ventricle demonstrates global hypokinesis. The left ventricular internal cavity size was mildly dilated. Left ventricular diastolic parameters were normal.  2. Right ventricular systolic function is normal. The right  ventricular size is mildly enlarged.  3. The mitral valve was not well visualized. Mild mitral valve regurgitation.  4. The aortic valve was not well visualized. Aortic valve regurgitation is trivial.  Mild aortic valve sclerosis is present, with no evidence of aortic valve stenosis.  5. Aortic dilatation noted. There is borderline dilatation of the aortic root, measuring 39 mm. FINDINGS  Left Ventricle: Left ventricular ejection fraction, by estimation, is 35 to 40%. The left ventricle has moderately decreased function. The left ventricle demonstrates global hypokinesis. Definity contrast agent was given IV to delineate the left ventricular endocardial borders. The left ventricular internal cavity size was mildly dilated. There is no left ventricular hypertrophy. Left ventricular diastolic parameters were normal. Right Ventricle: The right ventricular size is mildly enlarged. No increase in right ventricular wall thickness. Right ventricular systolic function is normal. Left Atrium: Left atrial size was normal in size. Right Atrium: Right atrial size was normal in size. Pericardium: There is no evidence of pericardial effusion. Mitral Valve: The mitral valve was not well visualized. Mild mitral valve regurgitation. MV peak gradient, 4.2 mmHg. The mean mitral valve gradient is 2.0 mmHg. Tricuspid Valve: The tricuspid valve is not well visualized. Tricuspid valve regurgitation is trivial. Aortic Valve: The aortic valve was not well visualized. Aortic valve regurgitation is trivial. Mild aortic valve sclerosis is present, with no evidence of aortic valve stenosis. Aortic valve mean gradient measures 7.0 mmHg. Aortic valve peak gradient measures 16.3 mmHg. Aortic valve area, by VTI measures 1.80 cm. Pulmonic Valve: The pulmonic valve was not well visualized. Pulmonic valve regurgitation is not visualized. Aorta: Aortic dilatation noted. There is borderline dilatation of the aortic root, measuring 39 mm. IAS/Shunts: The interatrial septum was not assessed.  LEFT VENTRICLE PLAX 2D LVIDd:         6.25 cm  Diastology LVIDs:         5.08 cm  LV e' medial:    6.31 cm/s LV PW:         1.17 cm  LV E/e' medial:  14.1 LV IVS:         0.88 cm  LV e' lateral:   6.31 cm/s LVOT diam:     2.50 cm  LV E/e' lateral: 14.1 LV SV:         60 LV SV Index:   29 LVOT Area:     4.91 cm  RIGHT VENTRICLE RV Basal diam:  5.61 cm RV Mid diam:    4.19 cm LEFT ATRIUM           Index       RIGHT ATRIUM           Index LA diam:      5.90 cm 2.80 cm/m  RA Area:     45.30 cm LA Vol (A4C): 48.3 ml 22.90 ml/m RA Volume:   215.00 ml 101.93 ml/m  AORTIC VALVE                    PULMONIC VALVE AV Area (Vmax):    1.72 cm     PV Vmax:       0.88 m/s AV Area (Vmean):   1.79 cm     PV Vmean:      55.900 cm/s AV Area (VTI):     1.80 cm     PV VTI:        0.161 m AV Vmax:           202.00 cm/s  PV Peak grad:  3.1 mmHg AV Vmean:          124.000 cm/s PV Mean grad:  1.0 mmHg AV VTI:            0.335 m AV Peak Grad:      16.3 mmHg AV Mean Grad:      7.0 mmHg LVOT Vmax:         70.90 cm/s LVOT Vmean:        45.100 cm/s LVOT VTI:          0.123 m LVOT/AV VTI ratio: 0.37  AORTA Ao Root diam: 3.90 cm MITRAL VALVE               TRICUSPID VALVE MV Area (PHT): 3.72 cm    TR Peak grad:   18.7 mmHg MV Peak grad:  4.2 mmHg    TR Vmax:        216.00 cm/s MV Mean grad:  2.0 mmHg MV Vmax:       1.02 m/s    SHUNTS MV Vmean:      66.5 cm/s   Systemic VTI:  0.12 m MV Decel Time: 204 msec    Systemic Diam: 2.50 cm MV E velocity: 88.80 cm/s MV A velocity: 60.10 cm/s MV E/A ratio:  1.48 Bartholome Bill MD Electronically signed by Bartholome Bill MD Signature Date/Time: 08/10/2020/12:18:10 PM    Final                LOS: 0 days   Hilario Robarts  [facility]   Pager on [email or web address]. If 7PM-7AM, please contact night-coverage at [email or web address]     08/10/2020, 4:18 PM

## 2020-08-10 NOTE — ED Notes (Signed)
Pt states he does not want to take tramadol at this time

## 2020-08-10 NOTE — ED Notes (Signed)
Secretary given information to request medical records from patient's previous ER visit.

## 2020-08-10 NOTE — Progress Notes (Signed)
*  PRELIMINARY RESULTS* Echocardiogram 2D Echocardiogram has been performed.  Taylor [name] 08/10/2020, 11:57 AM

## 2020-08-10 NOTE — Consult Note (Signed)
CARDIOLOGY CONSULT NOTE               Patient ID: Taylor [name] MRN: [id number] DOB/AGE: 08/12/1941 79 y.o.  Admit date: 08/09/2020 Referring Physician Dr. Jennye Boroughs  Primary Physician Dr. Lelon Huh  Primary Cardiologist Dr. Nehemiah Massed  Reason for Consultation Chest pain, elevated troponin  HPI: Taylor [name] is a 79 year old male with a past medical history significant for coronary artery disease s/p CABG x 4 with a LIMA to LAD, SVG to OM1, OM2, and OM3 in 2007 at [facility], ischemic cardiomyopathy with HFrEF with ICD in place, history of V-fib arrest in 1994, SA node dysfunction, a AAA s/p endovascular repair in 2008, obstructive sleep apnea, hyperlipidemia, and hypertension who presented to the ED on 08/09/20 for chest pain with associated shortness of breath.  He presented to [facility] in [place] earlier on 08/09/20, due to elevated BP with associated SOB, transfer was recommended due to elevated cardiac enzymes, and Taylor [name] left AMA, choosing to present to an ED closer to home.   He is followed in outpatient cardiology by Dr. Nehemiah Massed.  Stress test on 12/29/18 revealed a large perfusion abnormality of severe intensity in the septal anterior and apical myocardial region on the stress images with resting images showing dilation of the left ventricle with continued septal anterior and apical myocardial perfusion defects, consistent with previous infarct/scar - no evidence of reversible ischemia. Echocardiogram on 12/28/18 revealed severe LV systolic dysfunction with an EF estimated at 20% with moderate MR, mild TR, AR, PR, and mild pulmonary hypertension.   08/10/20: Taylor [name] admits to a 1 day history of centralized chest pain, apparent when coughing or taking a deep breath in.  He was diagnosed with COVID in October of 2020 and reports ongoing shortness of breath since. He denies any recent history of exertional chest pain.  He denies heart racing or heart fluttering.  He denies  orthopnea, PND, or syncopal/presyncopal episodes.   Review of systems complete and found to be negative unless listed above     Past Medical History:  Diagnosis Date  . AAA (abdominal aortic aneurysm) [facility]) 06/03/2007   [facility]; Dr. Kellie Simmering  . AICD (automatic cardioverter/defibrillator) present   . Barrett's esophagus   . Bladder cancer ([place])   . Bradycardia   . CAD (coronary artery disease)   . CHF (congestive heart failure) ([place])   . Cluster headache   . DDD (degenerative disc disease), lumbar   . Dyspnea    WITH EXERTION  . Edema    LEFT ANKLE  . Fracture of skull base ([place]) 1997   due to fall  . GERD (gastroesophageal reflux disease)   . Gout   . History of bladder cancer 12/1995  . Hyperlipidemia   . Hypertension   . Malignant melanoma ([place]) 12/2012   right dorsal forearm excised  . Myocardial infarction ([place])    LAST 2014  . Osteoarthritis of knee   . Pacemaker 10/10/2006  . Pneumonia    2016  . Pre-diabetes   . Psoriasis   . Rib fracture 1997   due to fall  . Sleep apnea    CPAP  . Stroke ([place])   . Venous incompetence     Past Surgical History:  Procedure Laterality Date  . ABDOMINAL AORTIC ANEURYSM REPAIR  06/03/2007   [facility]; Dr. Kellie Simmering  . ANGIOPLASTY  1994   MI  . BLADDER TUMOR EXCISION  12/1995  .  CATARACT EXTRACTION W/PHACO Left 10/22/2016   Procedure: CATARACT EXTRACTION PHACO AND INTRAOCULAR LENS PLACEMENT (IOC);  Surgeon: Birder Robson, MD;  Location: [facility] ORS;  Service: Ophthalmology;  Laterality: Left;  [place] 47.7 AP% 18.4 CDE 8.78 Fluid pack lot # [id number]  . CATARACT EXTRACTION W/PHACO Right 12/10/2016   Procedure: CATARACT EXTRACTION PHACO AND INTRAOCULAR LENS PLACEMENT (IOC);  Surgeon: Birder Robson, MD;  Location: [facility] ORS;  Service: Ophthalmology;  Laterality: Right;  [place] 00:39 AP% 23.3 CDE 9.13 Fluid pack lot # [id number]  . CORONARY ANGIOPLASTY     STENTS X 5  . CORONARY ARTERY BYPASS  GRAFT  09/22/2006   four  . ELBOW BURSA SURGERY     DUE TO GOUT  . INSERT / REPLACE / REMOVE PACEMAKER    . MELANOMA EXCISION  12/2012   Right forearm    (Not in a hospital admission)  Social History   Socioeconomic History  . Marital status: Married    Spouse name: Not on file  . Number of children: 3  . Years of education: Not on file  . Highest education level: 12th grade  Occupational History  . Occupation: retired    Comment: previously worked as a Lawyer  . Smoking status: Former Smoker    Packs/day: 1.00    Types: Cigarettes    Quit date: 11/26/1999    Years since quitting: 20.7  . Smokeless tobacco: Never Used  Vaping Use  . Vaping Use: Never used  Substance and Sexual Activity  . Alcohol use: No  . Drug use: No  . Sexual activity: Not on file  Other Topics Concern  . Not on file  Social History Narrative   Lives at home with his family.  Independent at baseline.   Social Determinants of Health   Financial Resource Strain: Low Risk   . Difficulty of Paying Living Expenses: Not hard at all  Food Insecurity: No Food Insecurity  . Worried About Charity fundraiser in the Last Year: Never true  . Ran Out of Food in the Last Year: Never true  Transportation Needs: No Transportation Needs  . Lack of Transportation (Medical): No  . Lack of Transportation (Non-Medical): No  Physical Activity: Inactive  . Days of Exercise per Week: 0 days  . Minutes of Exercise per Session: 0 min  Stress: No Stress Concern Present  . Feeling of Stress : Not at all  Social Connections: Socially Integrated  . Frequency of Communication with Friends and Family: More than three times a week  . Frequency of Social Gatherings with Friends and Family: More than three times a week  . Attends Religious Services: More than 4 times per year  . Active Member of Clubs or Organizations: Yes  . Attends Archivist Meetings: More than 4 times per year  . Marital  Status: Married  Human resources officer Violence: Not At Risk  . Fear of Current or Ex-Partner: No  . Emotionally Abused: No  . Physically Abused: No  . Sexually Abused: No    Family History  Problem Relation Age of Onset  . Cancer Mother        Melanoma skin cancer  . Heart attack Father 30  . Cancer Father        throat cancer  . Arthritis Brother       Review of systems complete and found to be negative unless listed above      PHYSICAL EXAM  General: Well developed, well nourished,  in no acute distress HEENT:  Normocephalic and atramatic Neck:  No JVD.  Lungs: Clear bilaterally to auscultation and percussion. Heart: HRRR . Normal S1 and S2 without gallops or murmurs.  Abdomen: Bowel sounds are positive, abdomen soft and non-tender  Msk:  Back normal. Normal strength and tone for age. Extremities: No clubbing, cyanosis or edema.   Neuro: Alert and oriented X 3. Psych:  Good affect, responds appropriately  Labs:   Lab Results  Component Value Date   WBC 11.5 (H) 08/10/2020   HGB 13.8 08/10/2020   HCT 42.5 08/10/2020   MCV 99.3 08/10/2020   PLT 122 (L) 08/10/2020    Recent Labs  Lab 08/10/20 0328  NA 138  K 3.8  CL 103  CO2 27  BUN 41*  CREATININE 1.75*  CALCIUM 8.9  GLUCOSE 195*   Lab Results  Component Value Date   TROPONINI 0.03 (HH) 06/01/2018    Lab Results  Component Value Date   CHOL 156 11/22/2019   CHOL 110 05/12/2019   CHOL 96 05/07/2018   Lab Results  Component Value Date   HDL 43 11/22/2019   HDL 32 (L) 05/12/2019   HDL 28 (L) 05/07/2018   Lab Results  Component Value Date   LDLCALC 77 11/22/2019   LDLCALC 59 05/12/2019   LDLCALC 54 05/07/2018   Lab Results  Component Value Date   TRIG 182 (H) 11/22/2019   TRIG 82 11/13/2019   TRIG 94 05/12/2019   Lab Results  Component Value Date   CHOLHDL 3.6 11/22/2019   CHOLHDL 3.4 05/12/2019   CHOLHDL 3.4 05/07/2018   No results found for: LDLDIRECT    Radiology: DG Chest 2  View  Result Date: 08/09/2020 CLINICAL DATA:  Chest pain EXAM: CHEST - 2 VIEW COMPARISON:  CT 11/12/2019, plain radiographs 02/09/2020 FINDINGS: Patchy peripheral pulmonary opacities are again identified and are grossly stable when accounting for relatively poor pulmonary insufflation possibly representing post inflammatory scarring given its stability over time. No new focal pulmonary infiltrate. No pneumothorax or pleural effusion. Mild to moderate cardiomegaly is stable. Coronary artery bypass grafting has been performed. Left subclavian pacemaker defibrillator is unchanged. Pulmonary vascularity is normal. No acute bone abnormality. IMPRESSION: 1. No acute cardiopulmonary findings. 2. Stable patchy peripheral pulmonary opacities, possibly post inflammatory scarring given its stability over time. Electronically Signed   By: Fidela Salisbury MD   On: 08/09/2020 18:00    ASSESSMENT AND PLAN:  1.  Chest pain, elevated troponin   -With history of CAD s/p CABG, ICD in place   -Troponin borderline elevated here, but flat; chest pain is likely due to costochondritis    -Working on obtaining records from [facility], but no plan for invasive workup with a LHC at this time   2.   Ischemic cardiomyopathy with HFrEF   -Most recent EF estimated at 20%; ICD in place   -Continue carvedilol 12.5mg  BID, Lasix 20mg  daily   -Agree to hold losartan and HCTZ due to AKI; will resume when appropriate   -Patient deferred Entresto in the past due to cost  3.  Hypertension   -Losartan and HCTZ currently held due to AKI  -Continue carvedilol 12.5mg  BID  4.  Hyperlipidemia   -Continue atorvastatin   5.  OSA  -Continue nightly use of CPAP   The history, physical exam findings, and plan of care were all discussed with Dr. Bartholome Bill, and all decision making was made in collaboration.   Signed: Selinda Orion  Minette Brine PA-C 08/10/2020, 8:41 AM

## 2020-08-11 DIAGNOSIS — E785 Hyperlipidemia, unspecified: Secondary | ICD-10-CM | POA: Diagnosis present

## 2020-08-11 DIAGNOSIS — I130 Hypertensive heart and chronic kidney disease with heart failure and stage 1 through stage 4 chronic kidney disease, or unspecified chronic kidney disease: Secondary | ICD-10-CM | POA: Diagnosis present

## 2020-08-11 DIAGNOSIS — I2510 Atherosclerotic heart disease of native coronary artery without angina pectoris: Secondary | ICD-10-CM | POA: Diagnosis not present

## 2020-08-11 DIAGNOSIS — G4733 Obstructive sleep apnea (adult) (pediatric): Secondary | ICD-10-CM | POA: Diagnosis present

## 2020-08-11 DIAGNOSIS — Z951 Presence of aortocoronary bypass graft: Secondary | ICD-10-CM | POA: Diagnosis not present

## 2020-08-11 DIAGNOSIS — I4891 Unspecified atrial fibrillation: Secondary | ICD-10-CM | POA: Diagnosis not present

## 2020-08-11 DIAGNOSIS — Z8673 Personal history of transient ischemic attack (TIA), and cerebral infarction without residual deficits: Secondary | ICD-10-CM | POA: Diagnosis not present

## 2020-08-11 DIAGNOSIS — M940 Chondrocostal junction syndrome [Tietze]: Secondary | ICD-10-CM | POA: Diagnosis present

## 2020-08-11 DIAGNOSIS — Z85820 Personal history of malignant melanoma of skin: Secondary | ICD-10-CM | POA: Diagnosis not present

## 2020-08-11 DIAGNOSIS — E7800 Pure hypercholesterolemia, unspecified: Secondary | ICD-10-CM | POA: Diagnosis present

## 2020-08-11 DIAGNOSIS — R079 Chest pain, unspecified: Secondary | ICD-10-CM | POA: Diagnosis not present

## 2020-08-11 DIAGNOSIS — I5022 Chronic systolic (congestive) heart failure: Secondary | ICD-10-CM | POA: Diagnosis present

## 2020-08-11 DIAGNOSIS — Z8551 Personal history of malignant neoplasm of bladder: Secondary | ICD-10-CM | POA: Diagnosis not present

## 2020-08-11 DIAGNOSIS — Z95810 Presence of automatic (implantable) cardiac defibrillator: Secondary | ICD-10-CM | POA: Diagnosis not present

## 2020-08-11 DIAGNOSIS — Z9841 Cataract extraction status, right eye: Secondary | ICD-10-CM | POA: Diagnosis not present

## 2020-08-11 DIAGNOSIS — Z20822 Contact with and (suspected) exposure to covid-19: Secondary | ICD-10-CM | POA: Diagnosis present

## 2020-08-11 DIAGNOSIS — R0789 Other chest pain: Secondary | ICD-10-CM | POA: Diagnosis present

## 2020-08-11 DIAGNOSIS — Z8616 Personal history of COVID-19: Secondary | ICD-10-CM | POA: Diagnosis not present

## 2020-08-11 DIAGNOSIS — I509 Heart failure, unspecified: Secondary | ICD-10-CM | POA: Diagnosis not present

## 2020-08-11 DIAGNOSIS — Z8679 Personal history of other diseases of the circulatory system: Secondary | ICD-10-CM | POA: Diagnosis not present

## 2020-08-11 DIAGNOSIS — K219 Gastro-esophageal reflux disease without esophagitis: Secondary | ICD-10-CM | POA: Diagnosis present

## 2020-08-11 DIAGNOSIS — I252 Old myocardial infarction: Secondary | ICD-10-CM | POA: Diagnosis not present

## 2020-08-11 DIAGNOSIS — E876 Hypokalemia: Secondary | ICD-10-CM | POA: Diagnosis present

## 2020-08-11 DIAGNOSIS — N1832 Chronic kidney disease, stage 3b: Secondary | ICD-10-CM | POA: Diagnosis present

## 2020-08-11 DIAGNOSIS — R778 Other specified abnormalities of plasma proteins: Secondary | ICD-10-CM | POA: Diagnosis present

## 2020-08-11 DIAGNOSIS — Z9842 Cataract extraction status, left eye: Secondary | ICD-10-CM | POA: Diagnosis not present

## 2020-08-11 DIAGNOSIS — N179 Acute kidney failure, unspecified: Secondary | ICD-10-CM | POA: Diagnosis present

## 2020-08-11 LAB — CBC WITH DIFFERENTIAL/PLATELET
Abs Immature Granulocytes: 0.07 10*3/uL (ref 0.00–0.07)
Basophils Absolute: 0 10*3/uL (ref 0.0–0.1)
Basophils Relative: 0 %
Eosinophils Absolute: 0 10*3/uL (ref 0.0–0.5)
Eosinophils Relative: 0 %
HCT: 41.6 % (ref 39.0–52.0)
Hemoglobin: 13.3 g/dL (ref 13.0–17.0)
Immature Granulocytes: 1 %
Lymphocytes Relative: 6 %
Lymphs Abs: 0.6 10*3/uL — ABNORMAL LOW (ref 0.7–4.0)
MCH: 32.2 pg (ref 26.0–34.0)
MCHC: 32 g/dL (ref 30.0–36.0)
MCV: 100.7 fL — ABNORMAL HIGH (ref 80.0–100.0)
Monocytes Absolute: 0.4 10*3/uL (ref 0.1–1.0)
Monocytes Relative: 4 %
Neutro Abs: 8.3 10*3/uL — ABNORMAL HIGH (ref 1.7–7.7)
Neutrophils Relative %: 89 %
Platelets: 105 10*3/uL — ABNORMAL LOW (ref 150–400)
RBC: 4.13 MIL/uL — ABNORMAL LOW (ref 4.22–5.81)
RDW: 16.3 % — ABNORMAL HIGH (ref 11.5–15.5)
WBC: 9.4 10*3/uL (ref 4.0–10.5)
nRBC: 0.4 % — ABNORMAL HIGH (ref 0.0–0.2)

## 2020-08-11 LAB — BASIC METABOLIC PANEL
Anion gap: 11 (ref 5–15)
BUN: 46 mg/dL — ABNORMAL HIGH (ref 8–23)
CO2: 27 mmol/L (ref 22–32)
Calcium: 9.2 mg/dL (ref 8.9–10.3)
Chloride: 97 mmol/L — ABNORMAL LOW (ref 98–111)
Creatinine, Ser: 1.48 mg/dL — ABNORMAL HIGH (ref 0.61–1.24)
GFR calc Af Amer: 52 mL/min — ABNORMAL LOW (ref >60)
GFR calc non Af Amer: 45 mL/min — ABNORMAL LOW (ref >60)
Glucose, Bld: 277 mg/dL — ABNORMAL HIGH (ref 70–99)
Potassium: 4.5 mmol/L (ref 3.5–5.1)
Sodium: 135 mmol/L (ref 135–145)

## 2020-08-11 LAB — UREA NITROGEN, URINE: Urea Nitrogen, Ur: 854 mg/dL

## 2020-08-11 LAB — MAGNESIUM: Magnesium: 1.9 mg/dL (ref 1.7–2.4)

## 2020-08-11 MED ORDER — CARVEDILOL 12.5 MG PO TABS
12.5000 mg | ORAL_TABLET | Freq: Two times a day (BID) | ORAL | 4 refills | Status: DC
Start: 2020-08-11 — End: 2020-11-22

## 2020-08-11 MED ORDER — ALLOPURINOL 300 MG PO TABS: 600.0000 mg | ORAL_TABLET | Freq: Every day | ORAL | Status: DC

## 2020-08-11 NOTE — Discharge Summary (Addendum)
Physician Discharge Summary  Taylor [name] FYB:[id number] DOB: 1941/02/21 DOA: 08/09/2020  PCP: Birdie Sons, MD  Admit date: 08/09/2020 Discharge date: 08/11/2020  Discharge disposition: Home   Recommendations for Outpatient Follow-Up:   Follow-up with Dr. Nehemiah Massed in 1 week Follow-up with PCP in 1 week   Discharge Diagnosis:   Principal Problem:   Chest pain Active Problems:   CAD (coronary artery disease)   Cardiac defibrillator in place   Systolic CHF with reduced left ventricular function, NYHA class 3 ([place])   Hypercholesteremia   Obstructive sleep apnea   Essential (primary) hypertension   Acute kidney injury superimposed on CKD [facility])    Discharge Condition: Stable.  Diet recommendation:  Diet Order            Diet - low sodium heart healthy           Diet Carb Modified           Diet Heart Room service appropriate? Yes; Fluid consistency: Thin  Diet effective now                   Code Status: Full Code     Hospital Course:   [name] is a 79 y.o. male with medical history significant for CAD s/p CABG, chronic systolic CHF (EF 52-77% by TTE 11/13/2019) s/p AICD, CKD stage III, AAA s/p endovascular repair 2008, HTN, HLD, and OSA on CPAP.  He presented to the hospital because of chest pain and shortness of breath.  He said he had previously gone to [facility] in [place], [place].  He said over that, he was told he had had "a massive heart attack".  He said he was going to be transferred to another hospital in [place].  He did not like the idea so he left AGAINST MEDICAL ADVICE.  He presented to [facility] for further evaluation.  He also mentioned that he had "pulled a muscle" about a week prior to admission.  His troponins were only mildly elevated and flat (66 and 61).  Given his significant cardiac history, cardiologist was consulted.  Attempt was made to obtain medical records from [facility].  However, as informed by Joellen Jersey, ED nurse, that they were told records will not be available until Monday, 08/15/2019.  Chest pain and shortness of breath have resolved.  Chest pain was probably musculoskeletal in origin.  He feels better and is deemed stable for discharge from cardiology standpoint.  Discharge plan was discussed with the patient and Dr. Ubaldo Glassing, cardiologist.    Of note, his blood pressure was elevated on admission.  However, he said generally his blood pressure is okay at home.  He said he used to take hydrochlorothiazide but this was discontinued because of significant hypokalemia and hypomagnesemia that required hospitalization in the past.  His blood glucose levels were also elevated but he said his glucose levels have been okay as well.  He received IV steroids in the emergency room which may have driven up his blood sugar.  He has been advised to follow-up with his PCP for close monitoring of glucose levels on blood pressure.  He will also follow-up with cardiologist as an outpatient.    Medical Consultants:    Cardiologist   Discharge Exam:    Vitals:   08/11/20 0500 08/11/20 0623 08/11/20 1053 08/11/20 1346  BP:  (!) 163/107 (!) 154/98 (!) 154/87  Pulse:  (!) 57 69 72  Resp:  20 19 18   Temp:  97.7 F (36.5 C) (!) 97.3 F (36.3 C) (!) 97.5 F (36.4 C)  TempSrc:  Oral Oral Oral  SpO2:  100% 96% 97%  Weight: 101.9 kg     Height:         GEN: NAD SKIN: No rash EYES: EOMI ENT: MMM CV: RRR PULM: CTA B ABD: soft, protuberant, NT, +BS CNS: AAO x 3, non focal EXT: No edema or tenderness MSK: Mild left upper chest wall tenderness   The results of significant diagnostics from this hospitalization (including imaging, microbiology, ancillary and laboratory) are listed below for reference.     Procedures and Diagnostic Studies:   ECHOCARDIOGRAM COMPLETE  Result Date: 08/10/2020    ECHOCARDIOGRAM REPORT   Patient Name:   Taylor [name] Date of Exam:  08/10/2020 Medical Rec #:  [id number]    Height:       70.0 in Accession #:    [id number]   Weight:       205.0 lb Date of Birth:  1941/09/02   BSA:          2.109 m Patient Age:    25 years     BP:           157/126 mmHg Patient Gender: M            HR:           76 bpm. Exam Location:  [facility] Procedure: 2D Echo, Color Doppler, Cardiac Doppler and Intracardiac            Opacification Agent Indications:     R06.00 Dyspnea  History:         Patient has prior history of Echocardiogram examinations.                  Previous Myocardial Infarction, Defibrillator and Prior CABG,                  Stroke; Risk Factors:Hypertension, Dyslipidemia and Former                  Smoker. Pt had covid-19 in 10/2019.  Sonographer:     Charmayne Sheer RDCS (AE) Referring Phys:  [id number] Cleaster Corin PATEL Diagnosing Phys: Bartholome Bill MD  Sonographer Comments: Technically difficult study due to poor echo windows. Image acquisition challenging due to patient body habitus. IMPRESSIONS  1. Left ventricular ejection fraction, by estimation, is 35 to 40%. The left ventricle has moderately decreased function. The left ventricle demonstrates global hypokinesis. The left ventricular internal cavity size was mildly dilated. Left ventricular diastolic parameters were normal.  2. Right ventricular systolic function is normal. The right ventricular size is mildly enlarged.  3. The mitral valve was not well visualized. Mild mitral valve regurgitation.  4. The aortic valve was not well visualized. Aortic valve regurgitation is trivial. Mild aortic valve sclerosis is present, with no evidence of aortic valve stenosis.  5. Aortic dilatation noted. There is borderline dilatation of the aortic root, measuring 39 mm. FINDINGS  Left Ventricle: Left ventricular ejection fraction, by estimation, is 35 to 40%. The left ventricle has moderately decreased function. The left ventricle demonstrates global hypokinesis. Definity contrast agent was given IV to delineate the  left ventricular endocardial borders. The left ventricular internal cavity size was mildly dilated. There is no left ventricular hypertrophy. Left ventricular diastolic parameters were normal. Right Ventricle: The right ventricular size is mildly enlarged. No increase in right ventricular wall thickness. Right ventricular systolic  function is normal. Left Atrium: Left atrial size was normal in size. Right Atrium: Right atrial size was normal in size. Pericardium: There is no evidence of pericardial effusion. Mitral Valve: The mitral valve was not well visualized. Mild mitral valve regurgitation. MV peak gradient, 4.2 mmHg. The mean mitral valve gradient is 2.0 mmHg. Tricuspid Valve: The tricuspid valve is not well visualized. Tricuspid valve regurgitation is trivial. Aortic Valve: The aortic valve was not well visualized. Aortic valve regurgitation is trivial. Mild aortic valve sclerosis is present, with no evidence of aortic valve stenosis. Aortic valve mean gradient measures 7.0 mmHg. Aortic valve peak gradient measures 16.3 mmHg. Aortic valve area, by VTI measures 1.80 cm. Pulmonic Valve: The pulmonic valve was not well visualized. Pulmonic valve regurgitation is not visualized. Aorta: Aortic dilatation noted. There is borderline dilatation of the aortic root, measuring 39 mm. IAS/Shunts: The interatrial septum was not assessed.  LEFT VENTRICLE PLAX 2D LVIDd:         6.25 cm  Diastology LVIDs:         5.08 cm  LV e' medial:    6.31 cm/s LV PW:         1.17 cm  LV E/e' medial:  14.1 LV IVS:        0.88 cm  LV e' lateral:   6.31 cm/s LVOT diam:     2.50 cm  LV E/e' lateral: 14.1 LV SV:         60 LV SV Index:   29 LVOT Area:     4.91 cm  RIGHT VENTRICLE RV Basal diam:  5.61 cm RV Mid diam:    4.19 cm LEFT ATRIUM           Index       RIGHT ATRIUM           Index LA diam:      5.90 cm 2.80 cm/m  RA Area:     45.30 cm LA Vol (A4C): 48.3 ml 22.90 ml/m RA Volume:   215.00 ml 101.93 ml/m  AORTIC VALVE                     PULMONIC VALVE AV Area (Vmax):    1.72 cm     PV Vmax:       0.88 m/s AV Area (Vmean):   1.79 cm     PV Vmean:      55.900 cm/s AV Area (VTI):     1.80 cm     PV VTI:        0.161 m AV Vmax:           202.00 cm/s  PV Peak grad:  3.1 mmHg AV Vmean:          124.000 cm/s PV Mean grad:  1.0 mmHg AV VTI:            0.335 m AV Peak Grad:      16.3 mmHg AV Mean Grad:      7.0 mmHg LVOT Vmax:         70.90 cm/s LVOT Vmean:        45.100 cm/s LVOT VTI:          0.123 m LVOT/AV VTI ratio: 0.37  AORTA Ao Root diam: 3.90 cm MITRAL VALVE               TRICUSPID VALVE MV Area (PHT): 3.72 cm    TR Peak grad:   18.7 mmHg MV Peak  grad:  4.2 mmHg    TR Vmax:        216.00 cm/s MV Mean grad:  2.0 mmHg MV Vmax:       1.02 m/s    SHUNTS MV Vmean:      66.5 cm/s   Systemic VTI:  0.12 m MV Decel Time: 204 msec    Systemic Diam: 2.50 cm MV E velocity: 88.80 cm/s MV A velocity: 60.10 cm/s MV E/A ratio:  1.48 Bartholome Bill MD Electronically signed by Bartholome Bill MD Signature Date/Time: 08/10/2020/12:18:10 PM    Final      Labs:   Basic Metabolic Panel: Recent Labs  Lab 08/09/20 1714 08/09/20 1714 08/10/20 0328 08/11/20 0406  NA 137  --  138 135  K 3.6   < > 3.8 4.5  CL 100  --  103 97*  CO2 25  --  27 27  GLUCOSE 190*  --  195* 277*  BUN 42*  --  41* 46*  CREATININE 1.86*  --  1.75* 1.48*  CALCIUM 9.3  --  8.9 9.2  MG  --   --   --  1.9   < > = values in this interval not displayed.   GFR Estimated Creatinine Clearance: 50 mL/min (A) (by C-G formula based on SCr of 1.48 mg/dL (H)). Liver Function Tests: No results for input(s): AST, ALT, ALKPHOS, BILITOT, PROT, ALBUMIN in the last 168 hours. No results for input(s): LIPASE, AMYLASE in the last 168 hours. No results for input(s): AMMONIA in the last 168 hours. Coagulation profile Recent Labs  Lab 08/09/20 1714  INR 1.3*    CBC: Recent Labs  Lab 08/09/20 1714 08/10/20 0328 08/11/20 0406  WBC 11.8* 11.5* 9.4  NEUTROABS  --   --  8.3*  HGB  13.3 13.8 13.3  HCT 42.3 42.5 41.6  MCV 101.0* 99.3 100.7*  PLT 129* 122* 105*   Cardiac Enzymes: No results for input(s): CKTOTAL, CKMB, CKMBINDEX, TROPONINI in the last 168 hours. BNP: Invalid input(s): POCBNP CBG: No results for input(s): GLUCAP in the last 168 hours. D-Dimer No results for input(s): DDIMER in the last 72 hours. Hgb A1c No results for input(s): HGBA1C in the last 72 hours. Lipid Profile No results for input(s): CHOL, HDL, LDLCALC, TRIG, CHOLHDL, LDLDIRECT in the last 72 hours. Thyroid function studies No results for input(s): TSH, T4TOTAL, T3FREE, THYROIDAB in the last 72 hours.  Invalid input(s): FREET3 Anemia work up No results for input(s): VITAMINB12, FOLATE, FERRITIN, TIBC, IRON, RETICCTPCT in the last 72 hours. Microbiology Recent Results (from the past 240 hour(s))  SARS Coronavirus 2 by RT PCR (hospital order, performed in [facility] hospital lab) Nasopharyngeal Nasopharyngeal Swab     Status: None   Collection Time: 08/10/20 12:23 AM   Specimen: Nasopharyngeal Swab  Result Value Ref Range Status   SARS Coronavirus 2 NEGATIVE NEGATIVE Final    Comment: (NOTE) SARS-CoV-2 target nucleic acids are NOT DETECTED.  The SARS-CoV-2 RNA is generally detectable in upper and lower respiratory specimens during the acute phase of infection. The lowest concentration of SARS-CoV-2 viral copies this assay can detect is 250 copies / mL. A negative result does not preclude SARS-CoV-2 infection and should not be used as the sole basis for treatment or other patient management decisions.  A negative result may occur with improper specimen collection / handling, submission of specimen other than nasopharyngeal swab, presence of viral mutation(s) within the areas targeted by this assay, and inadequate number of  viral copies (<250 copies / mL). A negative result must be combined with clinical observations, patient history, and epidemiological information.  Fact  Sheet for Patients:   StrictlyIdeas.no  Fact Sheet for Healthcare Providers: BankingDealers.co.za  This test is not yet approved or  cleared by the [place] FDA and has been authorized for detection and/or diagnosis of SARS-CoV-2 by FDA under an Emergency Use Authorization (EUA).  This EUA will remain in effect (meaning this test can be used) for the duration of the COVID-19 declaration under Section 564(b)(1) of the Act, 21 U.S.C. section 360bbb-3(b)(1), unless the authorization is terminated or revoked sooner.  Performed at [facility], [address] [place], [place] [address]      Discharge Instructions:   Discharge Instructions    Diet - low sodium heart healthy   Complete by: As directed    Diet Carb Modified   Complete by: As directed    Increase activity slowly   Complete by: As directed      Allergies as of 08/11/2020      Reactions   Amlodipine Besylate Swelling   Had a reaction when taking with colcrys    Crestor [rosuvastatin]    Muscle cramps and pain   Rocephin [ceftriaxone]    unknown      Medication List    STOP taking these medications   Combivent Respimat 20-100 MCG/ACT Aers respimat Generic drug: Ipratropium-Albuterol   hydrochlorothiazide 25 MG tablet Commonly known as: HYDRODIURIL   indomethacin 50 MG capsule Commonly known as: INDOCIN     TAKE these medications   allopurinol 300 MG tablet Commonly known as: ZYLOPRIM Take 2 tablets (600 mg total) by mouth daily. What changed: See the new instructions.   aspirin 81 MG EC tablet Take 81 mg by mouth daily.   atorvastatin 40 MG tablet Commonly known as: LIPITOR TAKE 1 TABLET BY MOUTH EVERY DAY   Breo Ellipta 100-25 MCG/INH Aepb Generic drug: fluticasone furoate-vilanterol Inhale 1 puff into the lungs daily as needed.   calcium carbonate 500 MG chewable tablet Commonly known as: TUMS - dosed in mg elemental  calcium Chew 1 tablet by mouth daily.   carvedilol 12.5 MG tablet Commonly known as: COREG Take 1 tablet (12.5 mg total) by mouth 2 (two) times daily with a meal. What changed: when to take this   furosemide 20 MG tablet Commonly known as: LASIX Take 40 mg by mouth daily.   GLUCOSAMINE HCL PO Take 1,500 mg by mouth daily.   losartan 50 MG tablet Commonly known as: COZAAR TAKE 1 TABLET BY MOUTH DAILY   magnesium oxide 400 MG tablet Commonly known as: MAG-OX Take 800 mg by mouth 2 (two) times daily.   OneTouch Verio test strip Generic drug: glucose blood Use as instructed to check sugar daily   pantoprazole 40 MG tablet Commonly known as: PROTONIX TAKE 1 TABLET BY MOUTH EVERY DAY   triamcinolone ointment 0.1 % Commonly known as: KENALOG Apply 1 application topically 2 (two) times daily. As directed   Vitamin B-12 CR 1000 MCG Tbcr Take 1,000 mcg by mouth daily.       Follow-up Information    Corey Skains, MD Follow up.   Specialty: Cardiology Contact information: [facility] [place] [address] [phone]                Time coordinating discharge: 32 minutes  Signed:  Jennye Boroughs  [facility] 08/11/2020, 1:54 PM  Pager on [email or web address]. If 7PM-7AM, please contact night-coverage at [email or web address]

## 2020-08-11 NOTE — Plan of Care (Signed)
The patient has been discharged. IV has been removed. He was educated on the follow up appointments he needs to attend.  Problem: Education: Goal: Knowledge of General Education information will improve Description: Including pain rating scale, medication(s)/side effects and non-pharmacologic comfort measures Outcome: Completed/Met   Problem: Health Behavior/Discharge Planning: Goal: Ability to manage health-related needs will improve Outcome: Completed/Met   Problem: Clinical Measurements: Goal: Ability to maintain clinical measurements within normal limits will improve Outcome: Completed/Met Goal: Will remain free from infection Outcome: Completed/Met Goal: Diagnostic test results will improve Outcome: Completed/Met Goal: Respiratory complications will improve Outcome: Completed/Met Goal: Cardiovascular complication will be avoided Outcome: Completed/Met   Problem: Activity: Goal: Risk for activity intolerance will decrease Outcome: Completed/Met   Problem: Nutrition: Goal: Adequate nutrition will be maintained Outcome: Completed/Met   Problem: Coping: Goal: Level of anxiety will decrease Outcome: Completed/Met   Problem: Elimination: Goal: Will not experience complications related to bowel motility Outcome: Completed/Met Goal: Will not experience complications related to urinary retention Outcome: Completed/Met   Problem: Pain Managment: Goal: General experience of comfort will improve Outcome: Completed/Met   Problem: Safety: Goal: Ability to remain free from injury will improve Outcome: Completed/Met   Problem: Skin Integrity: Goal: Risk for impaired skin integrity will decrease Outcome: Completed/Met

## 2020-08-11 NOTE — Progress Notes (Signed)
Patient Name: Taylor [name] Date of Encounter: 08/11/2020  Hospital Problem List     Principal Problem:   Chest pain Active Problems:   CAD (coronary artery disease)   Cardiac defibrillator in place   Systolic CHF with reduced left ventricular function, NYHA class 3 (HCC)   Hypercholesteremia   Obstructive sleep apnea   Essential (primary) hypertension   Acute kidney injury superimposed on CKD [facility])    Patient Profile     79 year old male with a past medical history significant for coronary artery disease s/p CABG x 4 with a LIMA to LAD, SVG to OM1, OM2, and OM3 in 2007 at [facility], ischemic cardiomyopathy with HFrEF with ICD in place, history of V-fib arrest in 1994, SA node dysfunction, a AAA s/p endovascular repair in 2008, obstructive sleep apnea, hyperlipidemia, and hypertension who presented to the ED on 08/09/20 for chest pain with associated shortness of breath.  He presented to [facility] in [place] earlier on 08/09/20, due to elevated BP with associated SOB, transfer was recommended due to elevated cardiac enzymes, and Taylor [name] left AMA, choosing to present to an ED closer to home.   He is followed in outpatient cardiology by Dr. Nehemiah Massed.  Stress test on 12/29/18 revealed a large perfusion abnormality of severe intensity in the septal anterior and apical myocardial region on the stress images with resting images showing dilation of the left ventricle with continued septal anterior and apical myocardial perfusion defects, consistent with previous infarct/scar - no evidence of reversible ischemia. Echocardiogram on 12/28/18 revealed severe LV systolic dysfunction with an EF estimated at 20% with moderate MR, mild TR, AR, PR, and mild pulmonary hypertension.  Repeat echo during this hospitalization revealed improved ejection fraction of 35 to 40%. Subjective   Patient has diuresed well.  He denies any chest pain.  Shortness of breath has improved.  Is anxious to go  home.  Inpatient Medications     allopurinol  600 mg Oral Daily   aspirin EC  81 mg Oral Daily   atorvastatin  40 mg Oral q1800   calcium carbonate  1 tablet Oral Daily   carvedilol  12.5 mg Oral Daily   enoxaparin (LOVENOX) injection  40 mg Subcutaneous Q24H   furosemide  20 mg Oral Daily   guaiFENesin  600 mg Oral BID   losartan  50 mg Oral Daily   magnesium oxide  800 mg Oral BID   pantoprazole  40 mg Oral Daily   vitamin B-12  1,000 mcg Oral Daily    Vital Signs    Vitals:   08/10/20 2027 08/11/20 0500 08/11/20 0623 08/11/20 1053  BP: (!) 154/106  (!) 163/107 (!) 154/98  Pulse: 60  (!) 57 69  Resp:   20 19  Temp:   97.7 F (36.5 C) (!) 97.3 F (36.3 C)  TempSrc:   Oral Oral  SpO2:   100% 96%  Weight:  101.9 kg    Height:        Intake/Output Summary (Last 24 hours) at 08/11/2020 1241 Last data filed at 08/11/2020 0500 Gross per 24 hour  Intake 0 ml  Output 400 ml  Net -400 ml   Filed Weights   08/09/20 1711 08/11/20 0500  Weight: 93 kg 101.9 kg    Physical Exam    GEN: Well nourished, well developed, in no acute distress.  HEENT: normal.  Neck: Supple, no JVD, carotid bruits, or masses. Cardiac: RRR, no murmurs, rubs,  or gallops. No clubbing, cyanosis, edema.  Radials/DP/PT 2+ and equal bilaterally.  Respiratory:  Respirations regular and unlabored, clear to auscultation bilaterally. GI: Soft, nontender, nondistended, BS + x 4. MS: no deformity or atrophy. Skin: warm and dry, no rash. Neuro:  Strength and sensation are intact. Psych: Normal affect.  Labs    CBC Recent Labs    08/10/20 0328 08/11/20 0406  WBC 11.5* 9.4  NEUTROABS  --  8.3*  HGB 13.8 13.3  HCT 42.5 41.6  MCV 99.3 100.7*  PLT 122* 563*   Basic Metabolic Panel Recent Labs    08/10/20 0328 08/11/20 0406  NA 138 135  K 3.8 4.5  CL 103 97*  CO2 27 27  GLUCOSE 195* 277*  BUN 41* 46*  CREATININE 1.75* 1.48*  CALCIUM 8.9 9.2  MG  --  1.9   Liver Function  Tests No results for input(s): AST, ALT, ALKPHOS, BILITOT, PROT, ALBUMIN in the last 72 hours. No results for input(s): LIPASE, AMYLASE in the last 72 hours. Cardiac Enzymes No results for input(s): CKTOTAL, CKMB, CKMBINDEX, TROPONINI in the last 72 hours. BNP No results for input(s): BNP in the last 72 hours. D-Dimer No results for input(s): DDIMER in the last 72 hours. Hemoglobin A1C No results for input(s): HGBA1C in the last 72 hours. Fasting Lipid Panel No results for input(s): CHOL, HDL, LDLCALC, TRIG, CHOLHDL, LDLDIRECT in the last 72 hours. Thyroid Function Tests No results for input(s): TSH, T4TOTAL, T3FREE, THYROIDAB in the last 72 hours.  Invalid input(s): FREET3  Telemetry    Sinus rhythm  ECG    Sinus rhythm with no ischemia  Radiology    DG Chest 2 View  Result Date: 08/09/2020 CLINICAL DATA:  Chest pain EXAM: CHEST - 2 VIEW COMPARISON:  CT 11/12/2019, plain radiographs 02/09/2020 FINDINGS: Patchy peripheral pulmonary opacities are again identified and are grossly stable when accounting for relatively poor pulmonary insufflation possibly representing post inflammatory scarring given its stability over time. No new focal pulmonary infiltrate. No pneumothorax or pleural effusion. Mild to moderate cardiomegaly is stable. Coronary artery bypass grafting has been performed. Left subclavian pacemaker defibrillator is unchanged. Pulmonary vascularity is normal. No acute bone abnormality. IMPRESSION: 1. No acute cardiopulmonary findings. 2. Stable patchy peripheral pulmonary opacities, possibly post inflammatory scarring given its stability over time. Electronically Signed   By: Fidela Salisbury MD   On: 08/09/2020 18:00   ECHOCARDIOGRAM COMPLETE  Result Date: 08/10/2020    ECHOCARDIOGRAM REPORT   Patient Name:   Taylor [name] Date of Exam: 08/10/2020 Medical Rec #:  [id number]    Height:       70.0 in Accession #:    [id number]   Weight:       205.0 lb Date of Birth:  May 01, 1941    BSA:          2.109 m Patient Age:    33 years     BP:           157/126 mmHg Patient Gender: M            HR:           76 bpm. Exam Location:  [facility] Procedure: 2D Echo, Color Doppler, Cardiac Doppler and Intracardiac            Opacification Agent Indications:     R06.00 Dyspnea  History:         Patient has prior history of Echocardiogram examinations.  Previous Myocardial Infarction, Defibrillator and Prior CABG,                  Stroke; Risk Factors:Hypertension, Dyslipidemia and Former                  Smoker. Pt had covid-19 in 10/2019.  Sonographer:     Charmayne Sheer RDCS (AE) Referring Phys:  [id number] Cleaster Corin PATEL Diagnosing Phys: Bartholome Bill MD  Sonographer Comments: Technically difficult study due to poor echo windows. Image acquisition challenging due to patient body habitus. IMPRESSIONS  1. Left ventricular ejection fraction, by estimation, is 35 to 40%. The left ventricle has moderately decreased function. The left ventricle demonstrates global hypokinesis. The left ventricular internal cavity size was mildly dilated. Left ventricular diastolic parameters were normal.  2. Right ventricular systolic function is normal. The right ventricular size is mildly enlarged.  3. The mitral valve was not well visualized. Mild mitral valve regurgitation.  4. The aortic valve was not well visualized. Aortic valve regurgitation is trivial. Mild aortic valve sclerosis is present, with no evidence of aortic valve stenosis.  5. Aortic dilatation noted. There is borderline dilatation of the aortic root, measuring 39 mm. FINDINGS  Left Ventricle: Left ventricular ejection fraction, by estimation, is 35 to 40%. The left ventricle has moderately decreased function. The left ventricle demonstrates global hypokinesis. Definity contrast agent was given IV to delineate the left ventricular endocardial borders. The left ventricular internal cavity size was mildly dilated. There is no left ventricular hypertrophy.  Left ventricular diastolic parameters were normal. Right Ventricle: The right ventricular size is mildly enlarged. No increase in right ventricular wall thickness. Right ventricular systolic function is normal. Left Atrium: Left atrial size was normal in size. Right Atrium: Right atrial size was normal in size. Pericardium: There is no evidence of pericardial effusion. Mitral Valve: The mitral valve was not well visualized. Mild mitral valve regurgitation. MV peak gradient, 4.2 mmHg. The mean mitral valve gradient is 2.0 mmHg. Tricuspid Valve: The tricuspid valve is not well visualized. Tricuspid valve regurgitation is trivial. Aortic Valve: The aortic valve was not well visualized. Aortic valve regurgitation is trivial. Mild aortic valve sclerosis is present, with no evidence of aortic valve stenosis. Aortic valve mean gradient measures 7.0 mmHg. Aortic valve peak gradient measures 16.3 mmHg. Aortic valve area, by VTI measures 1.80 cm. Pulmonic Valve: The pulmonic valve was not well visualized. Pulmonic valve regurgitation is not visualized. Aorta: Aortic dilatation noted. There is borderline dilatation of the aortic root, measuring 39 mm. IAS/Shunts: The interatrial septum was not assessed.  LEFT VENTRICLE PLAX 2D LVIDd:         6.25 cm  Diastology LVIDs:         5.08 cm  LV e' medial:    6.31 cm/s LV PW:         1.17 cm  LV E/e' medial:  14.1 LV IVS:        0.88 cm  LV e' lateral:   6.31 cm/s LVOT diam:     2.50 cm  LV E/e' lateral: 14.1 LV SV:         60 LV SV Index:   29 LVOT Area:     4.91 cm  RIGHT VENTRICLE RV Basal diam:  5.61 cm RV Mid diam:    4.19 cm LEFT ATRIUM           Index       RIGHT ATRIUM  Index LA diam:      5.90 cm 2.80 cm/m  RA Area:     45.30 cm LA Vol (A4C): 48.3 ml 22.90 ml/m RA Volume:   215.00 ml 101.93 ml/m  AORTIC VALVE                    PULMONIC VALVE AV Area (Vmax):    1.72 cm     PV Vmax:       0.88 m/s AV Area (Vmean):   1.79 cm     PV Vmean:      55.900 cm/s AV  Area (VTI):     1.80 cm     PV VTI:        0.161 m AV Vmax:           202.00 cm/s  PV Peak grad:  3.1 mmHg AV Vmean:          124.000 cm/s PV Mean grad:  1.0 mmHg AV VTI:            0.335 m AV Peak Grad:      16.3 mmHg AV Mean Grad:      7.0 mmHg LVOT Vmax:         70.90 cm/s LVOT Vmean:        45.100 cm/s LVOT VTI:          0.123 m LVOT/AV VTI ratio: 0.37  AORTA Ao Root diam: 3.90 cm MITRAL VALVE               TRICUSPID VALVE MV Area (PHT): 3.72 cm    TR Peak grad:   18.7 mmHg MV Peak grad:  4.2 mmHg    TR Vmax:        216.00 cm/s MV Mean grad:  2.0 mmHg MV Vmax:       1.02 m/s    SHUNTS MV Vmean:      66.5 cm/s   Systemic VTI:  0.12 m MV Decel Time: 204 msec    Systemic Diam: 2.50 cm MV E velocity: 88.80 cm/s MV A velocity: 60.10 cm/s MV E/A ratio:  1.48 Bartholome Bill MD Electronically signed by Bartholome Bill MD Signature Date/Time: 08/10/2020/12:18:10 PM    Final     Assessment & Plan    79 year old male with a past medical history significant for coronary artery disease s/p CABG x 4 with a LIMA to LAD, SVG to OM1, OM2, and OM3 in 2007 at [facility], ischemic cardiomyopathy with HFrEF with ICD in place, history of V-fib arrest in 1994, SA node dysfunction, a AAA s/p endovascular repair in 2008, obstructive sleep apnea, hyperlipidemia, and hypertension who presented to the ED on 08/09/20 for chest pain with associated shortness of breath.  He presented to [facility] in [place] earlier on 08/09/20, due to elevated BP with associated SOB, transfer was recommended due to elevated cardiac enzymes, and Mr. Vandewater left AMA, choosing to present to an ED closer to home.   1.  Carotid artery disease-status post carotid bypass grafting with a LIMA to the LAD, saphenous vein graft OM1, OM 2, OM 3 in 2007 at [facility].  He has a known ischemic cardiomyopathy with HFrEF and an ICD in place.  He was seen after developing acute onset shortness of breath at an outside hospital and had elevated  troponin and was told he need to be transferred to a hospital for cardiac cath.  Records from this day were not able  to be obtained.  He drove himself after leaving AMA to our hospital.  His cardiac markers were unremarkable here in the mid 60s.  He had no further chest pain.  He has ruled out for myocardial infarction.  Likely had a mildly elevated high-sensitivity troponin due to acute onset congestive heart failure.  Patient is stable on carvedilol, furosemide 20 mg daily, lisinopril and atorvastatin.  He is also on enteric-coated aspirin.  No further inpatient work-up indicated at present.  Echo showed improved LV function to 35 to 40%.  Would discharge with outpatient follow-up with Dr. Nehemiah Massed in 1 to 2 weeks.  2.  HF R EF-EF 35 to 40% by echo during this hospitalization.  Would continue with afterload reduction and careful diuresis and low-sodium diet.  3.  SA node dysfunction-has an AICD in place.  Functioning normally.  Will follow as an outpatient.  Okay for discharge from a cardiac standpoint today as mentioned previously.  Signed, Javier Docker Dorothea Yow MD 08/11/2020, 12:41 PM  Pager: [phone]

## 2020-08-28 DIAGNOSIS — U071 COVID-19: Secondary | ICD-10-CM | POA: Diagnosis not present

## 2020-08-28 DIAGNOSIS — J9620 Acute and chronic respiratory failure, unspecified whether with hypoxia or hypercapnia: Secondary | ICD-10-CM | POA: Diagnosis not present

## 2020-08-31 ENCOUNTER — Other Ambulatory Visit: Payer: Self-pay | Admitting: Cardiology

## 2020-08-31 ENCOUNTER — Ambulatory Visit
Admission: RE | Admit: 2020-08-31 | Discharge: 2020-08-31 | Disposition: A | Discharge status: Home / Self Care | Payer: Medicare Other | Source: Ambulatory Visit | Attending: Cardiology | Admitting: Cardiology

## 2020-08-31 ENCOUNTER — Other Ambulatory Visit: Payer: Self-pay

## 2020-08-31 DIAGNOSIS — I495 Sick sinus syndrome: Secondary | ICD-10-CM | POA: Diagnosis not present

## 2020-08-31 DIAGNOSIS — I5022 Chronic systolic (congestive) heart failure: Secondary | ICD-10-CM | POA: Diagnosis not present

## 2020-08-31 DIAGNOSIS — I10 Essential (primary) hypertension: Secondary | ICD-10-CM | POA: Diagnosis not present

## 2020-08-31 DIAGNOSIS — I25708 Atherosclerosis of coronary artery bypass graft(s), unspecified, with other forms of angina pectoris: Secondary | ICD-10-CM | POA: Diagnosis not present

## 2020-08-31 DIAGNOSIS — I255 Ischemic cardiomyopathy: Secondary | ICD-10-CM | POA: Diagnosis not present

## 2020-08-31 LAB — BASIC METABOLIC PANEL
Anion gap: 8 (ref 5–15)
BUN: 29 mg/dL — ABNORMAL HIGH (ref 8–23)
CO2: 29 mmol/L (ref 22–32)
Calcium: 8.9 mg/dL (ref 8.9–10.3)
Chloride: 105 mmol/L (ref 98–111)
Creatinine, Ser: 1.71 mg/dL — ABNORMAL HIGH (ref 0.61–1.24)
GFR calc non Af Amer: 38 mL/min — ABNORMAL LOW (ref >60)
Glucose, Bld: 159 mg/dL — ABNORMAL HIGH (ref 70–99)
Potassium: 3.2 mmol/L — ABNORMAL LOW (ref 3.5–5.1)
Sodium: 142 mmol/L (ref 135–145)

## 2020-08-31 MED ORDER — FUROSEMIDE 10 MG/ML IJ SOLN
INTRAMUSCULAR | Status: AC
  Administered 2020-08-31: 80 mg via INTRAVENOUS
  Filled 2020-08-31: qty 8

## 2020-08-31 MED ORDER — POTASSIUM CHLORIDE CRYS ER 20 MEQ PO TBCR
EXTENDED_RELEASE_TABLET | ORAL | Status: AC
  Administered 2020-08-31: 40 meq via ORAL
  Filled 2020-08-31: qty 2

## 2020-08-31 MED ORDER — FUROSEMIDE 10 MG/ML IJ SOLN: 80.0000 mg | Freq: Once | INTRAMUSCULAR | Status: AC

## 2020-08-31 MED ORDER — FUROSEMIDE 10 MG/ML IJ SOLN: 80.0000 mg | Freq: Once | INTRAMUSCULAR | Status: DC

## 2020-08-31 MED ORDER — POTASSIUM CHLORIDE ER 10 MEQ PO TBCR: 40.0000 meq | EXTENDED_RELEASE_TABLET | Freq: Once | ORAL | Status: DC

## 2020-08-31 MED ORDER — POTASSIUM CHLORIDE CRYS ER 20 MEQ PO TBCR: 40.0000 meq | EXTENDED_RELEASE_TABLET | Freq: Once | ORAL | Status: AC

## 2020-09-04 DIAGNOSIS — I4901 Ventricular fibrillation: Secondary | ICD-10-CM | POA: Diagnosis not present

## 2020-09-04 DIAGNOSIS — I5023 Acute on chronic systolic (congestive) heart failure: Secondary | ICD-10-CM | POA: Diagnosis not present

## 2020-09-04 DIAGNOSIS — I6523 Occlusion and stenosis of bilateral carotid arteries: Secondary | ICD-10-CM | POA: Diagnosis not present

## 2020-09-04 DIAGNOSIS — I25708 Atherosclerosis of coronary artery bypass graft(s), unspecified, with other forms of angina pectoris: Secondary | ICD-10-CM | POA: Diagnosis not present

## 2020-09-04 DIAGNOSIS — I714 Abdominal aortic aneurysm, without rupture: Secondary | ICD-10-CM | POA: Diagnosis not present

## 2020-09-05 DIAGNOSIS — I495 Sick sinus syndrome: Secondary | ICD-10-CM | POA: Diagnosis not present

## 2020-09-12 ENCOUNTER — Ambulatory Visit (INDEPENDENT_AMBULATORY_CARE_PROVIDER_SITE_OTHER): Payer: Medicare Other | Admitting: Adult Health

## 2020-09-12 ENCOUNTER — Encounter: Payer: Self-pay | Admitting: Adult Health

## 2020-09-12 DIAGNOSIS — R197 Diarrhea, unspecified: Secondary | ICD-10-CM | POA: Insufficient documentation

## 2020-09-12 DIAGNOSIS — J069 Acute upper respiratory infection, unspecified: Secondary | ICD-10-CM

## 2020-09-12 NOTE — Progress Notes (Addendum)
Virtual telephone visit    Virtual Visit via Telephone Note   This visit type was conducted due to national recommendations for restrictions regarding the COVID-19 Pandemic (e.g. social distancing) in an effort to limit this patient's exposure and mitigate transmission in our community. Due to his co-morbid illnesses, this patient is at least at moderate risk for complications without adequate follow up. This format is felt to be most appropriate for this patient at this time. The patient did not have access to video technology or had technical difficulties with video requiring transitioning to audio format only (telephone). Physical exam was limited to content and character of the telephone converstion.    Patient location: at home  Provider location: Provider: Provider's office at  [facility], [place].     I discussed the limitations of evaluation and management by telemedicine and the availability of in person appointments. The patient expressed understanding and agreed to proceed.   Visit Date: 09/12/2020  Today's healthcare provider: Marcille Buffy, FNP   Chief Complaint  Patient presents with  . Emesis   Subjective    Emesis  This is a new problem. The current episode started today (patient states that he vomited after taking Mucinex this morning). Associated symptoms include diarrhea. Pertinent negatives include no abdominal pain, arthralgias, chest pain, chills, coughing, dizziness, fever, headaches, myalgias, sweats, URI or weight loss. He has tried nothing for the symptoms.    He reports he started with nasal drainage, hoarseness last pm. Onset he says started 09/11/2020 in the evening.   He has also had diarrhea x 5 episodes this morning that seems to have stopped. . Denies any bleeding or dark color stool.   He reports he took Mucinex on an empty stomach and vomited once this morning. Denies any blood.  He reports he feels better now.    He is not needing any oxygen to sleep currently and has in past.   Denies any recent illness, or exposures. No one else in the home is sick.   History of Covid in December 2020. He has CHF, denies any new swelling. Does not endorse any worsening shortness of breath has stayed the same post covid in December 2020 he reports. .   K was 3.2 on 08/31/20.  Pulse ox 99 %  Now patient reports.  Heart rate is 66 now patient reports.   Patient  denies any new fever, body aches,chills, rash, chest pain, shortness of breath, nausea.  Patient Active Problem List   Diagnosis Date Noted  . Diarrhea 09/12/2020  . Upper respiratory tract infection 09/12/2020  . Acute kidney injury superimposed on CKD ([place]) 08/10/2020  . Chest pain 08/09/2020  . Hypomagnesemia 04/26/2020  . Hypocalcemia 04/19/2020  . Numbness and tingling in both hands 04/19/2020  . Atrial fibrillation ([place]) 04/19/2020  . Other specified interstitial pulmonary diseases ([place]) 12/06/2019  . Abdominal pain   . Suspected COVID-19 virus infection 11/22/2019  . Pancreatitis 11/22/2019  . Leukocytosis 11/22/2019  . CAP (community acquired pneumonia) 11/13/2019  . Hyperuricemia 03/31/2019  . Numbness 05/07/2018  . Benign colonic polyp 03/24/2017  . Tibialis anterior tenosynovitis 12/21/2015  . Pleural nodule 07/27/2015  . Psoriatic arthritis ([place]) 05/12/2015  . Restrictive lung disease 05/12/2015  . Barrett esophagus 03/30/2015  . Ache in joint 03/30/2015  . Bradycardia 03/30/2015  . CAD (coronary artery disease) 03/30/2015  . Cardiac defibrillator in place 03/30/2015  . Chronic kidney disease (CKD), stage III (moderate) ([place]) 03/30/2015  .  Cluster headache syndrome 03/30/2015  . Degeneration of lumbar or lumbosacral intervertebral disc 03/30/2015  . Gout 03/30/2015  . Personal history of malignant neoplasm of bladder 03/30/2015  . Hypercholesteremia 03/30/2015  . LBP (low back pain) 03/30/2015  . Personal history of malignant  melanoma of skin 03/30/2015  . Muscle ache 03/30/2015  . Arthritis of knee, degenerative 03/30/2015  . Prediabetes 03/30/2015  . Psoriasis 03/30/2015  . Obstructive sleep apnea 03/30/2015  . Chronic venous insufficiency 03/30/2015  . History of abdominal aortic aneurysm (AAA) 06/24/2013  . History of CVA (cerebrovascular accident) 06/24/2013  . Cardiomyopathy, ischemic 06/24/2013  . History of AAA (abdominal aortic aneurysm) repair 06/24/2013  . Essential (primary) hypertension 08/17/2011  . Arrhythmia, sinus node 08/17/2011  . History of ventricular fibrillation 08/17/2011  . Systolic CHF with reduced left ventricular function, NYHA class 3 ([place]) 02/26/2010  . 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 Cardiac pacemaker in situ 10/10/2006   Past Medical History:  Diagnosis Date  . AAA (abdominal aortic aneurysm) [facility]) 06/03/2007   [facility]; Dr. Kellie Simmering  . AICD (automatic cardioverter/defibrillator) present   . Barrett's esophagus   . Bladder cancer ([place])   . Bradycardia   . CAD (coronary artery disease)   . CHF (congestive heart failure) ([place])   . Cluster headache   . DDD (degenerative disc disease), lumbar   . Dyspnea    WITH EXERTION  . Edema    LEFT ANKLE  . Fracture of skull base ([place]) 1997   due to fall  . GERD (gastroesophageal reflux disease)   . Gout   . History of bladder cancer 12/1995  . Hyperlipidemia   . Hypertension   . Malignant melanoma ([place]) 12/2012   right dorsal forearm excised  . Myocardial infarction ([place])    LAST 2014  . Osteoarthritis of knee   . Pacemaker 10/10/2006  . Pneumonia    2016  . Pre-diabetes   . Psoriasis   . Rib fracture 1997   due to fall  . Sleep apnea    CPAP  . Stroke ([place])   . Venous incompetence    Past Surgical History:  Procedure Laterality Date  . ABDOMINAL AORTIC ANEURYSM REPAIR  06/03/2007   [facility]; Dr. Kellie Simmering  . ANGIOPLASTY  1994   MI    . BLADDER TUMOR EXCISION  12/1995  . CATARACT EXTRACTION W/PHACO Left 10/22/2016   Procedure: CATARACT EXTRACTION PHACO AND INTRAOCULAR LENS PLACEMENT (IOC);  Surgeon: Birder Robson, MD;  Location: [facility] ORS;  Service: Ophthalmology;  Laterality: Left;  [place] 47.7 AP% 18.4 CDE 8.78 Fluid pack lot # [id number]  . CATARACT EXTRACTION W/PHACO Right 12/10/2016   Procedure: CATARACT EXTRACTION PHACO AND INTRAOCULAR LENS PLACEMENT (IOC);  Surgeon: Birder Robson, MD;  Location: [facility] ORS;  Service: Ophthalmology;  Laterality: Right;  [place] 00:39 AP% 23.3 CDE 9.13 Fluid pack lot # [id number]  . CORONARY ANGIOPLASTY     STENTS X 5  . CORONARY ARTERY BYPASS GRAFT  09/22/2006   four  . ELBOW BURSA SURGERY     DUE TO GOUT  . INSERT / REPLACE / REMOVE PACEMAKER    . MELANOMA EXCISION  12/2012   Right forearm   Social History   Tobacco Use  . Smoking status: Former Smoker    Packs/day: 1.00    Types: Cigarettes    Quit date: 11/26/1999    Years since quitting: 20.8  . Smokeless tobacco: Never Used  Vaping Use  .  Vaping Use: Never used  Substance Use Topics  . Alcohol use: No  . Drug use: No   Social History   Socioeconomic History  . Marital status: Married    Spouse name: Not on file  . Number of children: 3  . Years of education: Not on file  . Highest education level: 12th grade  Occupational History  . Occupation: retired    Comment: previously worked as a Lawyer  . Smoking status: Former Smoker    Packs/day: 1.00    Types: Cigarettes    Quit date: 11/26/1999    Years since quitting: 20.8  . Smokeless tobacco: Never Used  Vaping Use  . Vaping Use: Never used  Substance and Sexual Activity  . Alcohol use: No  . Drug use: No  . Sexual activity: Not on file  Other Topics Concern  . Not on file  Social History Narrative   Lives at home with his family.  Independent at baseline.   Social Determinants of Health   Financial Resource Strain: Low Risk   .  Difficulty of Paying Living Expenses: Not hard at all  Food Insecurity: No Food Insecurity  . Worried About Charity fundraiser in the Last Year: Never true  . Ran Out of Food in the Last Year: Never true  Transportation Needs: No Transportation Needs  . Lack of Transportation (Medical): No  . Lack of Transportation (Non-Medical): No  Physical Activity: Inactive  . Days of Exercise per Week: 0 days  . Minutes of Exercise per Session: 0 min  Stress: No Stress Concern Present  . Feeling of Stress : Not at all  Social Connections: Socially Integrated  . Frequency of Communication with Friends and Family: More than three times a week  . Frequency of Social Gatherings with Friends and Family: More than three times a week  . Attends Religious Services: More than 4 times per year  . Active Member of Clubs or Organizations: Yes  . Attends Archivist Meetings: More than 4 times per year  . Marital Status: Married  Human resources officer Violence: Not At Risk  . Fear of Current or Ex-Partner: No  . Emotionally Abused: No  . Physically Abused: No  . Sexually Abused: No   Family Status  Relation Name Status  . Mother  Deceased  . Father  Deceased  . Brother  Alive   Family History  Problem Relation Age of Onset  . Cancer Mother        Melanoma skin cancer  . Heart attack Father 17  . Cancer Father        throat cancer  . Arthritis Brother    Allergies  Allergen Reactions  . Amlodipine Besylate Swelling    Had a reaction when taking with colcrys   . Crestor [Rosuvastatin]     Muscle cramps and pain  . Rocephin [Ceftriaxone]     unknown      Medications: Outpatient Medications Prior to Visit  Medication Sig  . allopurinol (ZYLOPRIM) 300 MG tablet Take 2 tablets (600 mg total) by mouth daily.  Marland Kitchen aspirin 81 MG EC tablet Take 81 mg by mouth daily.   Marland Kitchen atorvastatin (LIPITOR) 40 MG tablet TAKE 1 TABLET BY MOUTH EVERY DAY  . calcium carbonate (TUMS - DOSED IN MG ELEMENTAL  CALCIUM) 500 MG chewable tablet Chew 1 tablet by mouth daily.  . carvedilol (COREG) 12.5 MG tablet Take 1 tablet (12.5 mg total) by mouth 2 (two)  times daily with a meal.  . Cyanocobalamin (VITAMIN B-12 CR) 1000 MCG TBCR Take 1,000 mcg by mouth daily.   . fluticasone furoate-vilanterol (BREO ELLIPTA) 100-25 MCG/INH AEPB Inhale 1 puff into the lungs daily as needed.  . furosemide (LASIX) 20 MG tablet Take 40 mg by mouth daily.   Marland Kitchen GLUCOSAMINE HCL PO Take 1,500 mg by mouth daily.   Marland Kitchen glucose blood (ONETOUCH VERIO) test strip Use as instructed to check sugar daily  . losartan (COZAAR) 50 MG tablet TAKE 1 TABLET BY MOUTH DAILY  . magnesium oxide (MAG-OX) 400 MG tablet Take 800 mg by mouth 2 (two) times daily.   . pantoprazole (PROTONIX) 40 MG tablet TAKE 1 TABLET BY MOUTH EVERY DAY  . potassium chloride SA (KLOR-CON) 20 MEQ tablet Take 20 mEq by mouth daily.  Marland Kitchen torsemide (DEMADEX) 20 MG tablet Take 20 mg by mouth 2 (two) times daily.  Marland Kitchen triamcinolone ointment (KENALOG) 0.1 % Apply 1 application topically 2 (two) times daily. As directed   No facility-administered medications prior to visit.    Review of Systems  Constitutional: Negative for chills, fever and weight loss.  Respiratory: Negative for cough.   Cardiovascular: Negative for chest pain.  Gastrointestinal: Positive for diarrhea and vomiting. Negative for abdominal pain.  Musculoskeletal: Negative for arthralgias and myalgias.  Neurological: Negative for dizziness and headaches.    Last CBC Lab Results  Component Value Date   WBC 9.4 08/11/2020   HGB 13.3 08/11/2020   HCT 41.6 08/11/2020   MCV 100.7 (H) 08/11/2020   MCH 32.2 08/11/2020   RDW 16.3 (H) 08/11/2020   PLT 105 (L) 25/03/3975   Last metabolic panel Lab Results  Component Value Date   GLUCOSE 159 (H) 08/31/2020   NA 142 08/31/2020   K 3.2 (L) 08/31/2020   CL 105 08/31/2020   CO2 29 08/31/2020   BUN 29 (H) 08/31/2020   CREATININE 1.71 (H) 08/31/2020    GFRNONAA 38 (L) 08/31/2020   GFRAA 52 (L) 08/11/2020   CALCIUM 8.9 08/31/2020   PHOS 2.9 06/30/2020   PROT 8.2 (H) 04/19/2020   ALBUMIN 3.6 (L) 06/30/2020   LABGLOB 2.9 02/09/2020   AGRATIO 1.4 02/09/2020   BILITOT 1.6 (H) 04/19/2020   ALKPHOS 92 04/19/2020   AST 26 04/19/2020   ALT 13 04/19/2020   ANIONGAP 8 08/31/2020   Last lipids Lab Results  Component Value Date   CHOL 156 11/22/2019   HDL 43 11/22/2019   LDLCALC 77 11/22/2019   TRIG 182 (H) 11/22/2019   CHOLHDL 3.6 11/22/2019   Last hemoglobin A1c Lab Results  Component Value Date   HGBA1C 7.7 (A) 12/14/2019   Last thyroid functions Lab Results  Component Value Date   TSH 1.331 11/23/2019   Last vitamin D Lab Results  Component Value Date   VD25OH 46.58 04/19/2020   Last vitamin B12 and Folate Lab Results  Component Value Date   VITAMINB12 1,141 02/09/2020    Objective    There were no vitals taken for this visit.      Assessment & Plan     Upper respiratory tract infection, unspecified type - Plan: COVID-19, Flu A+B and RSV, COVID-19, Flu A+B and RSV  Diarrhea, unspecified type - Plan: COVID-19, Flu A+B and RSV, COVID-19, Flu A+B and RSV  advise recheck CMP in one week.   Given Respiratory  symptoms initially followed by diarrhea and emesis x 1  he will test in parking lot on 09/13/20 430 stay in  car and wear mask for above ordered lab.   Advised light hydration given his CHF, advised in person visit at Urgent Care for hands on vist should any symptoms worsen at anytime.    In person visit if any symptoms persist or worsen at anytime.   I discussed the assessment and treatment plan with the patient. The patient was provided an opportunity to ask questions and all were answered. The patient agreed with the plan and demonstrated an understanding of the instructions.  Symptom management.  I discussed the assessment and treatment plan with the patient. The patient was provided an  opportunity to ask questions and all were answered. The patient agreed with the plan and demonstrated an understanding of the instructions.   The patient was advised to call back or seek an in-person evaluation if the symptoms worsen or if the condition fails to improve as anticipated.  I provided 30  minutes of non-face-to-face time during this encounter.  I discussed the limitations of evaluation and management by telemedicine and the availability of in person appointments. The patient expressed understanding and agreed to proceed.  Marcille Buffy, [place] [phone] (phone) [phone] (fax)  [place]

## 2020-09-12 NOTE — Patient Instructions (Signed)
Viral Gastroenteritis, Adult  Viral gastroenteritis is also known as the stomach flu. This condition may affect your stomach, your small intestine, and your large intestine. It can cause sudden watery poop (diarrhea), fever, and throwing up (vomiting). This condition is caused by certain germs (viruses). These germs can be passed from person to person very easily (are contagious). Having watery poop and throwing up can make you feel weak and cause you to not have enough water in your body (get dehydrated). This can make you tired and thirsty, make you have a dry mouth, and make it so you pee (urinate) less often. It is important to replace the fluids that you lose from having watery poop and throwing up. What are the causes?  You can get sick by catching viruses from other people.  You can also get sick by: ? Eating food, drinking water, or touching a surface that has the viruses on it (is contaminated). ? Sharing utensils or other personal items with a person who is sick. What increases the risk?  Having a weak body defense system (immune system).  Living with one or more children who are younger than 2 years old.  Living in a nursing home.  Going on cruise ships. What are the signs or symptoms? Symptoms of this condition start suddenly. Symptoms may last for a few days or for as long as a week.  Common symptoms include: ? Watery poop. ? Throwing up.  Other symptoms include: ? Fever. ? Headache. ? Feeling tired (fatigue). ? Pain in the belly (abdomen). ? Chills. ? Feeling weak. ? Feeling sick to your stomach (nauseous). ? Muscle aches. ? Not feeling hungry. How is this treated?  This condition typically goes away on its own.  The focus of treatment is to replace the fluids that you lose. This condition may be treated with: ? An ORS (oral rehydration solution). This is a drink that is sold at pharmacies and stores. ? Medicines to help with your symptoms. ? Probiotic  supplements to reduce symptoms of diarrhea. ? Fluids given through an IV tube, if needed.  Older adults and people with other diseases or a weak body defense system are at higher risk for not having enough water in the body. Follow these instructions at home: Eating and drinking   Take an ORS as told by your doctor.  Drink clear fluids in small amounts as you are able. Clear fluids include: ? Water. ? Ice chips. ? Fruit juice with water added to it (diluted). ? Low-calorie sports drinks.  Drink enough fluid to keep your pee (urine) pale yellow.  Eat small amounts of healthy foods every 3-4 hours as you are able. This may include whole grains, fruits, vegetables, lean meats, and yogurt.  Avoid fluids that have a lot of sugar or caffeine in them, such as energy drinks, sports drinks, and soda.  Avoid spicy or fatty foods.  Avoid alcohol. General instructions   Wash your hands often. This is very important after you have watery poop or you throw up. If you cannot use soap and water, use hand sanitizer.  Make sure that all people in your home wash their hands well and often.  Take over-the-counter and prescription medicines only as told by your doctor.  Rest at home while you get better.  Watch your condition for any changes.  Take a warm bath to help with any burning or pain from having watery poop.  Keep all follow-up visits as told by your doctor.   This is important. Contact a doctor if:  You cannot keep fluids down.  Your symptoms get worse.  You have new symptoms.  You feel light-headed.  You feel dizzy.  You have muscle cramps. Get help right away if:  You have chest pain.  You feel very weak.  You pass out (faint).  You see blood in your throw-up.  Your throw-up looks like coffee grounds.  You have bloody or black poop (stools) or poop that looks like tar.  You have a very bad headache, or a stiff neck, or both.  You have a rash.  You have  very bad pain, cramping, or bloating in your belly.  You have trouble breathing.  You are breathing very quickly.  You have a fast heartbeat.  Your skin feels cold and clammy.  You feel mixed up (confused).  You have pain when you pee.  You have signs of not having enough water in the body, such as: ? Dark pee, hardly any pee, or no pee. ? Cracked lips. ? Dry mouth. ? Sunken eyes. ? Feeling very sleepy. ? Feeling weak. Summary  Viral gastroenteritis is also known as the stomach flu.  This condition can cause sudden watery poop (diarrhea), fever, and throwing up (vomiting).  These germs can be passed from person to person very easily.  Take an ORS as told by your doctor. This is a drink that is sold at pharmacies and stores.  Drink fluids in small amounts many times each day as you are able. This information is not intended to replace advice given to you by your health care provider. Make sure you discuss any questions you have with your health care provider. Document Revised: 09/16/2018 Document Reviewed: 09/16/2018 Elsevier Patient Education  2020 [place]. Upper Respiratory Infection, Adult An upper respiratory infection (URI) affects the nose, throat, and upper air passages. URIs are caused by germs (viruses). The most common type of URI is often called "the common cold." Medicines cannot cure URIs, but you can do things at home to relieve your symptoms. URIs usually get better within 7-10 days. Follow these instructions at home: Activity  Rest as needed.  If you have a fever, stay home from work or school until your fever is gone, or until your doctor says you may return to work or school. ? You should stay home until you cannot spread the infection anymore (you are not contagious). ? Your doctor may have you wear a face mask so you have less risk of spreading the infection. Relieving symptoms  Gargle with a salt-water mixture 3-4 times a day or as needed. To  make a salt-water mixture, completely dissolve -1 tsp of salt in 1 cup of warm water.  Use a cool-mist humidifier to add moisture to the air. This can help you breathe more easily. Eating and drinking   Drink enough fluid to keep your pee (urine) pale yellow.  Eat soups and other clear broths. General instructions   Take over-the-counter and prescription medicines only as told by your doctor. These include cold medicines, fever reducers, and cough suppressants.  Do not use any products that contain nicotine or tobacco. These include cigarettes and e-cigarettes. If you need help quitting, ask your doctor.  Avoid being where people are smoking (avoid secondhand smoke).  Make sure you get regular shots and get the flu shot every year.  Keep all follow-up visits as told by your doctor. This is important. How to avoid spreading infection to others  Wash your hands often with soap and water. If you do not have soap and water, use hand sanitizer.  Avoid touching your mouth, face, eyes, or nose.  Cough or sneeze into a tissue or your sleeve or elbow. Do not cough or sneeze into your hand or into the air. Contact a doctor if:  You are getting worse, not better.  You have any of these: ? A fever. ? Chills. ? Brown or red mucus in your nose. ? Yellow or brown fluid (discharge)coming from your nose. ? Pain in your face, especially when you bend forward. ? Swollen neck glands. ? Pain with swallowing. ? White areas in the back of your throat. Get help right away if:  You have shortness of breath that gets worse.  You have very bad or constant: ? Headache. ? Ear pain. ? Pain in your forehead, behind your eyes, and over your cheekbones (sinus pain). ? Chest pain.  You have long-lasting (chronic) lung disease along with any of these: ? Wheezing. ? Long-lasting cough. ? Coughing up blood. ? A change in your usual mucus.  You have a stiff neck.  You have changes in  your: ? Vision. ? Hearing. ? Thinking. ? Mood. Summary  An upper respiratory infection (URI) is caused by a germ called a virus. The most common type of URI is often called "the common cold."  URIs usually get better within 7-10 days.  Take over-the-counter and prescription medicines only as told by your doctor. This information is not intended to replace advice given to you by your health care provider. Make sure you discuss any questions you have with your health care provider. Document Revised: 11/19/2018 Document Reviewed: 07/04/2017 Elsevier Patient Education  [place].

## 2020-09-13 DIAGNOSIS — J069 Acute upper respiratory infection, unspecified: Secondary | ICD-10-CM | POA: Diagnosis not present

## 2020-09-15 LAB — COVID-19, FLU A+B AND RSV
Influenza A, NAA: NOT DETECTED
Influenza B, NAA: NOT DETECTED
RSV, NAA: NOT DETECTED
SARS-CoV-2, NAA: NOT DETECTED

## 2020-09-15 LAB — SPECIMEN STATUS REPORT

## 2020-09-15 NOTE — Progress Notes (Signed)
Negative for sars, rsv and flu a and b. Return to office if symptoms persist or are worsening at anytime.

## 2020-09-23 ENCOUNTER — Other Ambulatory Visit: Payer: Self-pay | Admitting: Family Medicine

## 2020-09-23 DIAGNOSIS — M1000 Idiopathic gout, unspecified site: Secondary | ICD-10-CM

## 2020-09-23 NOTE — Telephone Encounter (Signed)
Requested medication (s) are due for refill today: yes  Requested medication (s) are on the active medication list: yes  Last refill:  08/11/20  Future visit scheduled: yes  Notes to clinic:  last refilled at dc'd from hospital   Requested Prescriptions  Pending Prescriptions Disp Refills   allopurinol (ZYLOPRIM) 300 MG tablet [Pharmacy Med Name: ALLOPURINOL 300MG  TABLETS] 60 tablet     Sig: TAKE 2 TABLETS(600 MG) BY MOUTH DAILY      Endocrinology:  Gout Agents Failed - 09/23/2020 10:41 AM      Failed - Cr in normal range and within 360 days    Creatinine, Ser  Date Value Ref Range Status  08/31/2020 1.71 (H) 0.61 - 1.24 mg/dL Final   Creatinine, Urine  Date Value Ref Range Status  08/10/2020 121 mg/dL Final    Comment:    Performed at [facility], [place], [place] [address]          Passed - Uric Acid in normal range and within 360 days    Uric Acid  Date Value Ref Range Status  06/30/2020 4.7 3.8 - 8.4 mg/dL Final    Comment:               Therapeutic target for gout patients: <6.0          Passed - Valid encounter within last 12 months    Recent Outpatient Visits           1 week ago Upper respiratory tract infection, unspecified type   [facility] Flinchum, Kelby Aline, FNP   2 months ago Essential (primary) hypertension   [facility] Birdie Sons, MD   4 months ago Shortness of breath   [facility] Birdie Sons, MD   5 months ago Hypocalcemia   [facility] Birdie Sons, MD   5 months ago Hypocalcemia   [facility] Birdie Sons, MD       Future Appointments             In 1 week [name], [name], MD [facility], [place]

## 2020-09-28 DIAGNOSIS — J9620 Acute and chronic respiratory failure, unspecified whether with hypoxia or hypercapnia: Secondary | ICD-10-CM | POA: Diagnosis not present

## 2020-09-28 DIAGNOSIS — U071 COVID-19: Secondary | ICD-10-CM | POA: Diagnosis not present

## 2020-10-02 DIAGNOSIS — I5022 Chronic systolic (congestive) heart failure: Secondary | ICD-10-CM | POA: Diagnosis not present

## 2020-10-02 DIAGNOSIS — I480 Paroxysmal atrial fibrillation: Secondary | ICD-10-CM | POA: Diagnosis not present

## 2020-10-02 DIAGNOSIS — I25708 Atherosclerosis of coronary artery bypass graft(s), unspecified, with other forms of angina pectoris: Secondary | ICD-10-CM | POA: Diagnosis not present

## 2020-10-02 DIAGNOSIS — E782 Mixed hyperlipidemia: Secondary | ICD-10-CM | POA: Diagnosis not present

## 2020-10-02 DIAGNOSIS — I10 Essential (primary) hypertension: Secondary | ICD-10-CM | POA: Diagnosis not present

## 2020-10-04 ENCOUNTER — Encounter: Payer: Self-pay | Admitting: Family Medicine

## 2020-10-04 ENCOUNTER — Other Ambulatory Visit: Payer: Self-pay

## 2020-10-04 ENCOUNTER — Ambulatory Visit (INDEPENDENT_AMBULATORY_CARE_PROVIDER_SITE_OTHER): Payer: Medicare Other | Admitting: Family Medicine

## 2020-10-04 VITALS — BP 154/89 | HR 71 | Temp 97.9°F | Resp 16 | Ht 70.0 in | Wt 216.0 lb

## 2020-10-04 DIAGNOSIS — E876 Hypokalemia: Secondary | ICD-10-CM

## 2020-10-04 DIAGNOSIS — I10 Essential (primary) hypertension: Secondary | ICD-10-CM | POA: Diagnosis not present

## 2020-10-04 DIAGNOSIS — Z23 Encounter for immunization: Secondary | ICD-10-CM

## 2020-10-04 DIAGNOSIS — I480 Paroxysmal atrial fibrillation: Secondary | ICD-10-CM

## 2020-10-04 DIAGNOSIS — R739 Hyperglycemia, unspecified: Secondary | ICD-10-CM | POA: Diagnosis not present

## 2020-10-04 DIAGNOSIS — I495 Sick sinus syndrome: Secondary | ICD-10-CM

## 2020-10-04 NOTE — Progress Notes (Signed)
Established patient visit   Patient: Taylor [name]   DOB: [birth year]   79 y.o. Male  MRN: [id number] Visit Date: 10/04/2020  Today's healthcare provider: Lelon Huh, MD   Chief Complaint  Patient presents with  . Hypertension   Subjective    HPI  Hypertension, follow-up  BP Readings from Last 3 Encounters:  10/04/20 (!) 154/89  08/11/20 (!) 154/87  06/30/20 129/72   Wt Readings from Last 3 Encounters:  10/04/20 216 lb (98 kg)  08/11/20 224 lb 11.2 oz (101.9 kg)  06/30/20 218 lb 12.8 oz (99.2 kg)     He was last seen for hypertension 3 months ago.  BP at that visit was 129/72. Management since that visit includes no changes.  He reports good compliance with treatment. He is not having side effects.  He is following a Regular diet. He is not exercising. He does not smoke.  Use of agents associated with hypertension: none.   Outside blood pressures are checked occasionally. Patient reports that BP averages in the 130s/70s at home.  Symptoms: No chest pain No chest pressure  No palpitations No syncope  No dyspnea No orthopnea  No paroxysmal nocturnal dyspnea No lower extremity edema   Pertinent labs: Lab Results  Component Value Date   CHOL 156 11/22/2019   HDL 43 11/22/2019   LDLCALC 77 11/22/2019   TRIG 182 (H) 11/22/2019   CHOLHDL 3.6 11/22/2019   Lab Results  Component Value Date   NA 142 08/31/2020   K 3.2 (L) 08/31/2020   CREATININE 1.71 (H) 08/31/2020   GFRNONAA 38 (L) 08/31/2020   GFRAA 52 (L) 08/11/2020   GLUCOSE 159 (H) 08/31/2020     The 10-year ASCVD risk score [name], et al., 2013) is: 40.6%       Medications: Outpatient Medications Prior to Visit  Medication Sig  . allopurinol (ZYLOPRIM) 300 MG tablet TAKE 2 TABLETS(600 MG) BY MOUTH DAILY  . aspirin 81 MG EC tablet Take 81 mg by mouth daily.   Marland Kitchen atorvastatin (LIPITOR) 40 MG tablet TAKE 1 TABLET BY MOUTH EVERY DAY  . calcium carbonate (TUMS - DOSED IN MG ELEMENTAL  CALCIUM) 500 MG chewable tablet Chew 1 tablet by mouth daily.  . carvedilol (COREG) 12.5 MG tablet Take 1 tablet (12.5 mg total) by mouth 2 (two) times daily with a meal.  . Cyanocobalamin (VITAMIN B-12 CR) 1000 MCG TBCR Take 1,000 mcg by mouth daily.   . fluticasone furoate-vilanterol (BREO ELLIPTA) 100-25 MCG/INH AEPB Inhale 1 puff into the lungs daily as needed.  . furosemide (LASIX) 20 MG tablet Take 40 mg by mouth daily.   Marland Kitchen GLUCOSAMINE HCL PO Take 1,500 mg by mouth daily.   Marland Kitchen glucose blood (ONETOUCH VERIO) test strip Use as instructed to check sugar daily  . losartan (COZAAR) 50 MG tablet TAKE 1 TABLET BY MOUTH DAILY  . magnesium oxide (MAG-OX) 400 MG tablet Take 800 mg by mouth 2 (two) times daily.   . pantoprazole (PROTONIX) 40 MG tablet TAKE 1 TABLET BY MOUTH EVERY DAY  . potassium chloride SA (KLOR-CON) 20 MEQ tablet Take 20 mEq by mouth daily.  Marland Kitchen torsemide (DEMADEX) 20 MG tablet Take 20 mg by mouth 2 (two) times daily.  Marland Kitchen triamcinolone ointment (KENALOG) 0.1 % Apply 1 application topically 2 (two) times daily. As directed   No facility-administered medications prior to visit.    Review of Systems  Constitutional: Negative.   Respiratory: Negative.   Cardiovascular:  Negative.   Musculoskeletal: Negative.   Neurological: Negative.       Objective    BP (!) 154/89   Pulse 71   Temp 97.9 F (36.6 C)   Resp 16   Ht 5\' 10"  (1.778 m)   Wt 216 lb (98 kg)   BMI 30.99 kg/m    Physical Exam   General: Appearance:    Obese male in no acute distress  Eyes:    PERRL, conjunctiva/corneas clear, EOM's intact       Lungs:     Clear to auscultation bilaterally, respirations unlabored  Heart:    Normal heart rate. Regularly irregular rhythm. No murmurs, rubs, or gallops.   MS:   All extremities are intact.   Neurologic:   Awake, alert, oriented x 3. No apparent focal neurological           defect.          Assessment & Plan     1. Essential (primary)  hypertension Uncontrolled. See how labs look. Advised we will likely need to adjust medications after reviewing labs.   2. Hypokalemia  - Renal function panel - Magnesium  3. Hyperglycemia Due to check - Hemoglobin A1c  4. Sinoatrial node dysfunction (HCC)   5. Paroxysmal atrial fibrillation [facility]) He had visit with Dr. Nehemiah Massed on 10-02-2020 and he states they discussed starting ant-coagulation.  He was counseled on stroke risk associated with atrial fibrillation. He prefers to continue ECASA for now. Unclear if this is chronic or persistent. He has follow up with Nehemiah Massed to discuss further in [place].   CHA2DS2-VASc Score = 5  This indicates a 7.2% annual risk of stroke.  6. Need for influenza vaccination  - Flu Vaccine QUAD High Dose(Fluad) f       The entirety of the information documented in the History of Present Illness, Review of Systems and Physical Exam were personally obtained by me. Portions of this information were initially documented by the CMA and reviewed by me for thoroughness and accuracy.      Lelon Huh, MD  [facility] [phone] (phone) [phone] (fax)  [place]

## 2020-10-05 ENCOUNTER — Telehealth: Payer: Self-pay

## 2020-10-05 ENCOUNTER — Other Ambulatory Visit: Payer: Self-pay | Admitting: Family Medicine

## 2020-10-05 DIAGNOSIS — E119 Type 2 diabetes mellitus without complications: Secondary | ICD-10-CM

## 2020-10-05 LAB — RENAL FUNCTION PANEL
Albumin: 4 g/dL (ref 3.7–4.7)
BUN/Creatinine Ratio: 14 (ref 10–24)
BUN: 20 mg/dL (ref 8–27)
CO2: 27 mmol/L (ref 20–29)
Calcium: 9.2 mg/dL (ref 8.6–10.2)
Chloride: 100 mmol/L (ref 96–106)
Creatinine, Ser: 1.44 mg/dL — ABNORMAL HIGH (ref 0.76–1.27)
GFR calc Af Amer: 54 mL/min/{1.73_m2} — ABNORMAL LOW (ref >59)
GFR calc non Af Amer: 47 mL/min/{1.73_m2} — ABNORMAL LOW (ref >59)
Glucose: 132 mg/dL — ABNORMAL HIGH (ref 65–99)
Phosphorus: 3.3 mg/dL (ref 2.8–4.1)
Potassium: 3.6 mmol/L (ref 3.5–5.2)
Sodium: 142 mmol/L (ref 134–144)

## 2020-10-05 LAB — HEMOGLOBIN A1C
Est. average glucose Bld gHb Est-mCnc: 189 mg/dL
Hgb A1c MFr Bld: 8.2 % — ABNORMAL HIGH (ref 4.8–5.6)

## 2020-10-05 LAB — MAGNESIUM: Magnesium: 1.4 mg/dL — ABNORMAL LOW (ref 1.6–2.3)

## 2020-10-05 MED ORDER — METFORMIN HCL ER 500 MG PO TB24: 500.0000 mg | ORAL_TABLET | Freq: Every day | ORAL | 1 refills | Status: DC

## 2020-10-05 NOTE — Telephone Encounter (Signed)
Requested medication (s) are due for refill today: Yes  Requested medication (s) are on the active medication list: Yes  Last refill:  10/05/20 #30/1 refill  Future visit scheduled: Yes  Notes to clinic:  Patient requesting a 90 day refill, unsure if provider wants to send 90 since f/u in December is noted      Requested Prescriptions  Pending Prescriptions Disp Refills   metFORMIN (GLUCOPHAGE-XR) 500 MG 24 hr tablet [Pharmacy Med Name: METFORMIN ER 500MG 24HR TABS] 90 tablet     Sig: TAKE 1 TABLET(500 MG) BY MOUTH DAILY WITH BREAKFAST      Endocrinology:  Diabetes - Biguanides Failed - 10/05/2020  9:57 AM      Failed - Cr in normal range and within 360 days    Creatinine, Ser  Date Value Ref Range Status  10/04/2020 1.44 (H) 0.76 - 1.27 mg/dL Final   Creatinine, Urine  Date Value Ref Range Status  08/10/2020 121 mg/dL Final    Comment:    Performed at [facility], [address] [place], [place] [address]          Failed - HBA1C is between 0 and 7.9 and within 180 days    Hgb A1c MFr Bld  Date Value Ref Range Status  10/04/2020 8.2 (H) 4.8 - 5.6 % Final    Comment:             Prediabetes: 5.7 - 6.4          Diabetes: >6.4          Glycemic control for adults with diabetes: <7.0           Failed - eGFR in normal range and within 360 days    GFR calc Af Amer  Date Value Ref Range Status  10/04/2020 54 (L) >59 mL/min/1.73 Final    Comment:    **In accordance with recommendations from the NKF-ASN Task force,**   [facility] is in the process of updating its eGFR calculation to the   2021 CKD-EPI creatinine equation that estimates kidney function   without a race variable.    GFR calc non Af Amer  Date Value Ref Range Status  10/04/2020 47 (L) >59 mL/min/1.73 Final          Passed - Valid encounter within last 6 months    Recent Outpatient Visits           Yesterday Essential (primary) hypertension   [facility] Birdie Sons,  MD   3 weeks ago Upper respiratory tract infection, unspecified type   [facility] Flinchum, Kelby Aline, FNP   3 months ago Essential (primary) hypertension   [facility] Birdie Sons, MD   5 months ago Shortness of breath   [facility] Birdie Sons, MD   5 months ago Hypocalcemia   [facility] Birdie Sons, MD       Future Appointments             In 1 month [name], [name], MD [facility], [place]

## 2020-10-05 NOTE — Telephone Encounter (Signed)
-----   Message from Birdie Sons, MD sent at 10/05/2020  7:20 AM EST ----- Kidney functions are better. Magnesium levels are low. A1c is up to 8.2.  Please see how much magnesium is current taking. He needs to add additional 400mg  or 500mg  a day to what he is taking.  He needs to start metformin ER 500mg  once a day for diabetes. #30, rf x 1. He needs to schedule a follow up in 6 weeks to check on sugar and magnesium.

## 2020-10-05 NOTE — Telephone Encounter (Signed)
Patient advised of results. He states he is taking Magnesium 400mg  tablets; taking 2 tablets daily (totaling 800mg  daily). Patient agrees to add an additional 400mg  daily to what he was taking.   Patient agrees to start Metformin. Prescription sent into pharmacy. Follow up appointment scheduled for 11/21/2020 at 2:40pm.

## 2020-10-05 NOTE — Telephone Encounter (Signed)
Dr. [name] Section, please review. I sent in a prescription for Metformin 500MG  ER earlier today for a qty of 30 with 1 refill. Patient's pharmacy sent a electronic fax requesting a 90 day prescription. Please advise if you would like to change the prescription to a 90 day supply.

## 2020-10-28 DIAGNOSIS — J9620 Acute and chronic respiratory failure, unspecified whether with hypoxia or hypercapnia: Secondary | ICD-10-CM | POA: Diagnosis not present

## 2020-10-28 DIAGNOSIS — U071 COVID-19: Secondary | ICD-10-CM | POA: Diagnosis not present

## 2020-11-03 ENCOUNTER — Other Ambulatory Visit: Payer: Self-pay | Admitting: Family Medicine

## 2020-11-03 DIAGNOSIS — E119 Type 2 diabetes mellitus without complications: Secondary | ICD-10-CM

## 2020-11-21 ENCOUNTER — Ambulatory Visit (INDEPENDENT_AMBULATORY_CARE_PROVIDER_SITE_OTHER): Payer: Medicare Other | Admitting: Family Medicine

## 2020-11-21 ENCOUNTER — Other Ambulatory Visit: Payer: Self-pay | Admitting: Family Medicine

## 2020-11-21 ENCOUNTER — Encounter: Payer: Self-pay | Admitting: Family Medicine

## 2020-11-21 ENCOUNTER — Other Ambulatory Visit: Payer: Self-pay

## 2020-11-21 VITALS — BP 127/69 | HR 64 | Temp 97.4°F | Resp 18 | Ht 70.0 in | Wt 206.0 lb

## 2020-11-21 DIAGNOSIS — E876 Hypokalemia: Secondary | ICD-10-CM | POA: Diagnosis not present

## 2020-11-21 DIAGNOSIS — E119 Type 2 diabetes mellitus without complications: Secondary | ICD-10-CM | POA: Diagnosis not present

## 2020-11-21 DIAGNOSIS — E1122 Type 2 diabetes mellitus with diabetic chronic kidney disease: Secondary | ICD-10-CM | POA: Insufficient documentation

## 2020-11-21 NOTE — Patient Instructions (Addendum)
Please review the attached list of medications and notify my office if there are any errors.    

## 2020-11-21 NOTE — Progress Notes (Signed)
I,April Miller,acting as a scribe for Lelon Huh, MD.,have documented all relevant documentation on the behalf of Lelon Huh, MD,as directed by  Lelon Huh, MD while in the presence of Lelon Huh, MD.   Established patient visit   Patient: Taylor [name]   DOB: November 16, 1941   79 y.o. Male  MRN: [id number] Visit Date: 11/21/2020  Today's healthcare provider: Lelon Huh, MD   Chief Complaint  Patient presents with  . Follow-up  . Diabetes  . Hypertension  . Hypokalemia   Subjective    HPI  Diabetes Mellitus Type II, follow-up  Lab Results  Component Value Date   HGBA1C 8.2 (H) 10/04/2020   HGBA1C 7.7 (A) 12/14/2019   HGBA1C 6.3 (H) 05/12/2019   Last seen for diabetes 7 weeks ago.  Management since then includes; labs checked showing-A1c is up to 8.2. Started metformin ER 500mg  once a day for diabetes. Advised to follow up in 6 weeks to check on sugar and magnesium.  He reports fair compliance with treatment. He is not having side effects. none  Home blood sugar records: fasting range: not checking  Episodes of hypoglycemia? No none   Current insulin regiment: n/a Most Recent Eye Exam: due  --------------------------------------------------------------------  Hypertension, follow-up  BP Readings from Last 3 Encounters:  11/21/20 127/69  10/04/20 (!) 154/89  08/11/20 (!) 154/87   Wt Readings from Last 3 Encounters:  11/21/20 206 lb (93.4 kg)  10/04/20 216 lb (98 kg)  08/11/20 224 lb 11.2 oz (101.9 kg)     He was last seen for hypertension 7 weeks ago.  BP at that visit was 154/89. Management since that visit includes; Uncontrolled. See how labs look. Advised we will likely need to adjust medications after reviewing labs.  He reports good compliance with treatment. He is not having side effects. none He is not exercising. He is not adherent to low salt diet.   Outside blood pressures are 140/90.  He does not smoke.  Use of agents  associated with hypertension: none.   --------------------------------------------------------------------  Hypokalemia From 10/03/2020-labs checked showing-Magnesium levels are low. He needs to add additional 400mg  or 500mg  a day to what he is taking.   Paroxysmal atrial fibrillation [facility]) He had visit with Dr. Nehemiah Massed on 10-02-2020 and he states they discussed starting ant-coagulation.  He was counseled on stroke risk associated with atrial fibrillation. He prefers to continue ECASA for now. Unclear if this is chronic or persistent. He has follow up with Nehemiah Massed to discuss further in [place].         Medications: Outpatient Medications Prior to Visit  Medication Sig  . allopurinol (ZYLOPRIM) 300 MG tablet TAKE 2 TABLETS(600 MG) BY MOUTH DAILY  . aspirin 81 MG EC tablet Take 81 mg by mouth daily.   Marland Kitchen atorvastatin (LIPITOR) 40 MG tablet TAKE 1 TABLET BY MOUTH EVERY DAY  . calcium carbonate (TUMS - DOSED IN MG ELEMENTAL CALCIUM) 500 MG chewable tablet Chew 1 tablet by mouth daily.  . carvedilol (COREG) 12.5 MG tablet Take 1 tablet (12.5 mg total) by mouth 2 (two) times daily with a meal.  . Cyanocobalamin (VITAMIN B-12 CR) 1000 MCG TBCR Take 1,000 mcg by mouth daily.   Marland Kitchen GLUCOSAMINE HCL PO Take 1,500 mg by mouth daily.   Marland Kitchen glucose blood (ONETOUCH VERIO) test strip Use as instructed to check sugar daily  . losartan (COZAAR) 50 MG tablet TAKE 1 TABLET BY MOUTH DAILY  . magnesium oxide (MAG-OX) 400 MG tablet  Take 800 mg by mouth 2 (two) times daily.   . metFORMIN (GLUCOPHAGE-XR) 500 MG 24 hr tablet TAKE 1 TABLET(500 MG) BY MOUTH DAILY WITH BREAKFAST  . pantoprazole (PROTONIX) 40 MG tablet TAKE 1 TABLET BY MOUTH EVERY DAY  . potassium chloride SA (KLOR-CON) 20 MEQ tablet Take 20 mEq by mouth daily.  Marland Kitchen torsemide (DEMADEX) 20 MG tablet Take 20 mg by mouth 2 (two) times daily.  Marland Kitchen triamcinolone ointment (KENALOG) 0.1 % Apply 1 application topically 2 (two) times daily. As directed  .  [DISCONTINUED] fluticasone furoate-vilanterol (BREO ELLIPTA) 100-25 MCG/INH AEPB Inhale 1 puff into the lungs daily as needed. (Patient not taking: Reported on 11/21/2020)  . [DISCONTINUED] furosemide (LASIX) 20 MG tablet Take 40 mg by mouth daily.  (Patient not taking: Reported on 11/21/2020)   No facility-administered medications prior to visit.    Review of Systems  Constitutional: Negative for appetite change, chills and fever.  Respiratory: Negative for chest tightness, shortness of breath and wheezing.   Cardiovascular: Negative for chest pain and palpitations.  Gastrointestinal: Negative for abdominal pain, nausea and vomiting.      Objective    BP 127/69 (BP Location: Right Arm, Patient Position: Sitting, Cuff Size: Large)   Pulse 64   Temp (!) 97.4 F (36.3 C) (Oral)   Resp 18   Ht 5\' 10"  (1.778 m)   Wt 206 lb (93.4 kg)   SpO2 99%   BMI 29.56 kg/m    Physical Exam   General: Appearance:     Overweight male in no acute distress  Eyes:    PERRL, conjunctiva/corneas clear, EOM's intact       Lungs:     Clear to auscultation bilaterally, respirations unlabored  Heart:    Normal heart rate. Regular rhythm. No murmurs, rubs, or gallops.   MS:   All extremities are intact.   Neurologic:   Awake, alert, oriented x 3. No apparent focal neurological           defect.        No results found for any visits on 11/21/20.  Assessment & Plan     1. Diabetes mellitus without complication ([place]) Tolerating initiation of metformin well with no adverse effects. If labs normal will follow up follow up to check a1c in 3-4 months.   2. Hypokalemia  - Renal function panel  3. Hypomagnesemia Has doubled magnesium supplements and tolerating well.  - Magnesium       The entirety of the information documented in the History of Present Illness, Review of Systems and Physical Exam were personally obtained by me. Portions of this information were initially documented by the CMA and  reviewed by me for thoroughness and accuracy.      Lelon Huh, MD  [facility] [phone] (phone) [phone] (fax)  [place]

## 2020-11-21 NOTE — Telephone Encounter (Signed)
Requested medication (s) are due for refill today: yes  Requested medication (s) are on the active medication list: yes  Last refill: 08/11/20  #90  4 refills  Future visit scheduled:yes will be in office today for follow up  Notes to clinic:  Patient has OV today Please review with visit. Last visit note states uncontrolled may need to change dose.    Requested Prescriptions  Pending Prescriptions Disp Refills   carvedilol (COREG) 12.5 MG tablet [Pharmacy Med Name: CARVEDILOL 12.5MG  TABLETS] 90 tablet 4    Sig: TAKE 1 TABLET BY MOUTH DAILY      Cardiovascular:  Beta Blockers Failed - 11/21/2020  2:15 PM      Failed - Last BP in normal range    BP Readings from Last 1 Encounters:  10/04/20 (!) 154/89          Passed - Last Heart Rate in normal range    Pulse Readings from Last 1 Encounters:  10/04/20 71          Passed - Valid encounter within last 6 months    Recent Outpatient Visits           1 month ago Essential (primary) hypertension   [place], [name], MD   2 months ago Upper respiratory tract infection, unspecified type   [facility], Kelby Aline, FNP   4 months ago Essential (primary) hypertension   [facility] Birdie Sons, MD   6 months ago Shortness of breath   [facility] Birdie Sons, MD   6 months ago Hypocalcemia   [place], [name], MD       Future Appointments             Today [name], [name], MD [facility], [place]

## 2020-11-22 LAB — RENAL FUNCTION PANEL
Albumin: 3.8 g/dL (ref 3.7–4.7)
BUN/Creatinine Ratio: 15 (ref 10–24)
BUN: 19 mg/dL (ref 8–27)
CO2: 26 mmol/L (ref 20–29)
Calcium: 9.1 mg/dL (ref 8.6–10.2)
Chloride: 103 mmol/L (ref 96–106)
Creatinine, Ser: 1.31 mg/dL — ABNORMAL HIGH (ref 0.76–1.27)
GFR calc Af Amer: 60 mL/min/{1.73_m2} (ref >59)
GFR calc non Af Amer: 52 mL/min/{1.73_m2} — ABNORMAL LOW (ref >59)
Glucose: 165 mg/dL — ABNORMAL HIGH (ref 65–99)
Phosphorus: 3 mg/dL (ref 2.8–4.1)
Potassium: 4.1 mmol/L (ref 3.5–5.2)
Sodium: 144 mmol/L (ref 134–144)

## 2020-11-22 LAB — MAGNESIUM: Magnesium: 1.4 mg/dL — ABNORMAL LOW (ref 1.6–2.3)

## 2020-11-28 DIAGNOSIS — U071 COVID-19: Secondary | ICD-10-CM | POA: Diagnosis not present

## 2020-11-28 DIAGNOSIS — J9620 Acute and chronic respiratory failure, unspecified whether with hypoxia or hypercapnia: Secondary | ICD-10-CM | POA: Diagnosis not present

## 2020-11-30 DIAGNOSIS — I4901 Ventricular fibrillation: Secondary | ICD-10-CM | POA: Diagnosis not present

## 2020-11-30 DIAGNOSIS — I5022 Chronic systolic (congestive) heart failure: Secondary | ICD-10-CM | POA: Diagnosis not present

## 2020-11-30 DIAGNOSIS — I10 Essential (primary) hypertension: Secondary | ICD-10-CM | POA: Diagnosis not present

## 2020-11-30 DIAGNOSIS — I480 Paroxysmal atrial fibrillation: Secondary | ICD-10-CM | POA: Diagnosis not present

## 2020-11-30 DIAGNOSIS — I714 Abdominal aortic aneurysm, without rupture: Secondary | ICD-10-CM | POA: Diagnosis not present

## 2020-11-30 DIAGNOSIS — Z95810 Presence of automatic (implantable) cardiac defibrillator: Secondary | ICD-10-CM | POA: Diagnosis not present

## 2020-11-30 DIAGNOSIS — I495 Sick sinus syndrome: Secondary | ICD-10-CM | POA: Diagnosis not present

## 2020-11-30 DIAGNOSIS — E782 Mixed hyperlipidemia: Secondary | ICD-10-CM | POA: Diagnosis not present

## 2020-11-30 DIAGNOSIS — I25708 Atherosclerosis of coronary artery bypass graft(s), unspecified, with other forms of angina pectoris: Secondary | ICD-10-CM | POA: Diagnosis not present

## 2020-11-30 DIAGNOSIS — I6523 Occlusion and stenosis of bilateral carotid arteries: Secondary | ICD-10-CM | POA: Diagnosis not present

## 2020-11-30 DIAGNOSIS — I255 Ischemic cardiomyopathy: Secondary | ICD-10-CM | POA: Diagnosis not present

## 2020-12-20 ENCOUNTER — Other Ambulatory Visit: Payer: Self-pay | Admitting: Family Medicine

## 2020-12-20 DIAGNOSIS — E119 Type 2 diabetes mellitus without complications: Secondary | ICD-10-CM

## 2020-12-20 MED ORDER — METFORMIN HCL ER 500 MG PO TB24: 1000.0000 mg | ORAL_TABLET | Freq: Every day | ORAL | 4 refills | Status: DC

## 2020-12-27 ENCOUNTER — Telehealth: Payer: Self-pay | Admitting: *Deleted

## 2020-12-27 NOTE — Chronic Care Management (AMB) (Signed)
  Chronic Care Management   Note  12/27/2020 Name: ARYEH BUTTERFIELD MRN: [id number] DOB: 1941/01/18  MATHEWS STUHR is a 80 y.o. year old male who is a primary care patient of [name] Section, [name], MD. I reached out to [name] by phone today in response to a referral sent by Mr. Carvel Getting health plan.     Mr. Beale was given information about Chronic Care Management services today including:  1. CCM service includes personalized support from designated clinical staff supervised by his physician, including individualized plan of care and coordination with other care providers 2. 24/7 contact phone numbers for assistance for urgent and routine care needs. 3. Service will only be billed when office clinical staff spend 20 minutes or more in a month to coordinate care. 4. Only one practitioner may furnish and bill the service in a calendar month. 5. The patient may stop CCM services at any time (effective at the end of the month) by phone call to the office staff. 6. The patient will be responsible for cost sharing (co-pay) of up to 20% of the service fee (after annual deductible is met).  Patient agreed to services and verbal consent obtained.   Follow up plan: Telephone appointment with care management team member scheduled for: 01/23/2021  [place] [facility]

## 2020-12-29 ENCOUNTER — Other Ambulatory Visit: Payer: Self-pay | Admitting: Family Medicine

## 2020-12-29 DIAGNOSIS — U071 COVID-19: Secondary | ICD-10-CM | POA: Diagnosis not present

## 2020-12-29 DIAGNOSIS — J9620 Acute and chronic respiratory failure, unspecified whether with hypoxia or hypercapnia: Secondary | ICD-10-CM | POA: Diagnosis not present

## 2020-12-29 NOTE — Telephone Encounter (Signed)
Requested Prescriptions  Pending Prescriptions Disp Refills  . atorvastatin (LIPITOR) 40 MG tablet [Pharmacy Med Name: ATORVASTATIN 40MG  TABLETS] 90 tablet 1    Sig: TAKE 1 TABLET BY MOUTH EVERY DAY     Cardiovascular:  Antilipid - Statins Failed - 12/29/2020  7:12 AM      Failed - Total Cholesterol in normal range and within 360 days    Cholesterol, Total  Date Value Ref Range Status  05/12/2019 110 100 - 199 mg/dL Final   Cholesterol  Date Value Ref Range Status  11/22/2019 156 0 - 200 mg/dL Final         Failed - LDL in normal range and within 360 days    LDL Calculated  Date Value Ref Range Status  05/12/2019 59 0 - 99 mg/dL Final   LDL Cholesterol  Date Value Ref Range Status  11/22/2019 77 0 - 99 mg/dL Final    Comment:           Total Cholesterol/HDL:CHD Risk Coronary Heart Disease Risk Table                     Men   Women  1/2 Average Risk   3.4   3.3  Average Risk       5.0   4.4  2 X Average Risk   9.6   7.1  3 X Average Risk  23.4   11.0        Use the calculated Patient Ratio above and the CHD Risk Table to determine the patient's CHD Risk.        ATP III CLASSIFICATION (LDL):  <100     mg/dL   Optimal  100-129  mg/dL   Near or Above                    Optimal  130-159  mg/dL   Borderline  160-189  mg/dL   High  >190     mg/dL   Very High Performed at [facility], [address] [place], [place] [address]          Failed - HDL in normal range and within 360 days    HDL  Date Value Ref Range Status  11/22/2019 43 >40 mg/dL Final  05/12/2019 32 (L) >39 mg/dL Final         Failed - Triglycerides in normal range and within 360 days    Triglycerides  Date Value Ref Range Status  11/22/2019 182 (H) <150 mg/dL Final         Passed - Patient is not pregnant      Passed - Valid encounter within last 12 months    Recent Outpatient Visits          1 month ago Diabetes mellitus without complication [facility])   [facility]  Birdie Sons, MD   2 months ago Essential (primary) hypertension   [facility] Birdie Sons, MD   3 months ago Upper respiratory tract infection, unspecified type   [facility] Flinchum, Kelby Aline, FNP   6 months ago Essential (primary) hypertension   [facility] Birdie Sons, MD   8 months ago Shortness of breath   [facility] Birdie Sons, MD      Future Appointments            In 1 month [name], [name], MD [facility], [place]

## 2021-01-12 ENCOUNTER — Telehealth: Payer: Self-pay

## 2021-01-12 NOTE — Telephone Encounter (Signed)
Copied from [place] [id number]. Topic: General - Other >> Jan 12, 2021  3:18 PM Pawlus, Brayton Layman A wrote: Reason for CRM: PT called in stating that he needs an order from Dr [name] Section approving the removal of his oxygen machine. PT stated he wanted to have it collected but they first need an order from Dr [name] Section. Please CB.

## 2021-01-16 NOTE — Telephone Encounter (Signed)
Order written and placed on Dr. Maralyn Sago desk for signature. Signed order needs to be faxed to [facility]. Fax number [phone].

## 2021-01-16 NOTE — Telephone Encounter (Signed)
Ok to send order to d/c supplemental oxygen.

## 2021-01-22 ENCOUNTER — Telehealth: Payer: Self-pay

## 2021-01-22 NOTE — Progress Notes (Signed)
Spoke to patient to confirmed patient telephone appointment on 01/23/2021 for CCM at 12:30 pm with [name] the Clinical pharmacist.   Patient states he has a dentist appointment at 10:00am and would like to have the clinical pharmacist to call at 1:00pm.  Per clinical pharmacist ,that will be fine to do.  [place] Pharmacist Assistant [phone]

## 2021-01-23 ENCOUNTER — Telehealth: Payer: Medicare Other

## 2021-01-23 ENCOUNTER — Ambulatory Visit (INDEPENDENT_AMBULATORY_CARE_PROVIDER_SITE_OTHER): Payer: Medicare Other

## 2021-01-23 DIAGNOSIS — I5020 Unspecified systolic (congestive) heart failure: Secondary | ICD-10-CM | POA: Diagnosis not present

## 2021-01-23 DIAGNOSIS — E1165 Type 2 diabetes mellitus with hyperglycemia: Secondary | ICD-10-CM

## 2021-01-23 DIAGNOSIS — I480 Paroxysmal atrial fibrillation: Secondary | ICD-10-CM

## 2021-01-23 NOTE — Progress Notes (Deleted)
Chronic Care Management Pharmacy Note  01/23/2021 Name:  Taylor [name] MRN:  [id number] DOB:  03/10/1941  Subjective: Taylor [name] is an 80 y.o. year old male who is a primary patient of [name], [name], MD.  The CCM team was consulted for assistance with disease management and care coordination needs.    Engaged with patient by telephone for initial visit in response to provider referral for pharmacy case management and/or care coordination services.   Consent to Services:  The patient was given the following information about Chronic Care Management services today, agreed to services, and gave verbal consent: 1. CCM service includes personalized support from designated clinical staff supervised by the primary care provider, including individualized plan of care and coordination with other care providers 2. 24/7 contact phone numbers for assistance for urgent and routine care needs. 3. Service will only be billed when office clinical staff spend 20 minutes or more in a month to coordinate care. 4. Only one practitioner may furnish and bill the service in a calendar month. 5.The patient may stop CCM services at any time (effective at the end of the month) by phone call to the office staff. 6. The patient will be responsible for cost sharing (co-pay) of up to 20% of the service fee (after annual deductible is met). Patient agreed to services and consent obtained.  Patient Care Team: Birdie Sons, MD as PCP - General (Family Medicine) Corey Skains, MD as Consulting Physician (Internal Medicine) Birder Robson, MD as Referring Physician (Ophthalmology) Ottie Glazier, MD as Consulting Physician (Pulmonary Disease) Germaine Pomfret, [facility] (Pharmacist)  Recent office visits: 11/21/20: Patient presented to Dr. [name] Section for follow-up. Breo Elipta and furosemide stopped.   Recent consult visits: 11/30/20: Patient presented to Hassell Done, NP (Cardiology) for follow-up. Eliquis 5 mg twice  daily started for anticoagulation.   Hospital visits: {Hospital DC Yes/No:25215}  Objective:  Lab Results  Component Value Date   CREATININE 1.31 (H) 11/21/2020   BUN 19 11/21/2020   GFRNONAA 52 (L) 11/21/2020   GFRAA 60 11/21/2020   NA 144 11/21/2020   K 4.1 11/21/2020   CALCIUM 9.1 11/21/2020   CO2 26 11/21/2020    Lab Results  Component Value Date/Time   HGBA1C 8.2 (H) 10/04/2020 09:05 AM   HGBA1C 7.7 (A) 12/14/2019 10:31 AM   HGBA1C 6.3 (H) 05/12/2019 09:14 AM    Last diabetic Eye exam: No results found for: HMDIABEYEEXA  Last diabetic Foot exam: No results found for: HMDIABFOOTEX   Lab Results  Component Value Date   CHOL 156 11/22/2019   HDL 43 11/22/2019   LDLCALC 77 11/22/2019   TRIG 182 (H) 11/22/2019   CHOLHDL 3.6 11/22/2019    Hepatic Function Latest Ref Rng & Units 11/21/2020 10/04/2020 06/30/2020  Total Protein 6.5 - 8.1 g/dL - - -  Albumin 3.7 - 4.7 g/dL 3.8 4.0 3.6(L)  AST 15 - 41 U/L - - -  ALT 0 - 44 U/L - - -  Alk Phosphatase 38 - 126 U/L - - -  Total Bilirubin 0.3 - 1.2 mg/dL - - -  Bilirubin, Direct 0.0 - 0.2 mg/dL - - -    Lab Results  Component Value Date/Time   TSH 1.331 11/23/2019 04:58 AM   TSH 3.430 05/15/2015 09:08 AM    CBC Latest Ref Rng & Units 08/11/2020 08/10/2020 08/09/2020  WBC 4.0 - 10.5 K/uL 9.4 11.5(H) 11.8(H)  Hemoglobin 13.0 - 17.0 g/dL 13.3 13.8 13.3  Hematocrit  39.0 - 52.0 % 41.6 42.5 42.3  Platelets 150 - 400 K/uL 105(L) 122(L) 129(L)    Lab Results  Component Value Date/Time   VD25OH 46.58 04/19/2020 04:16 PM   VD25OH 47.4 05/15/2015 09:08 AM    Clinical ASCVD: {YES/NO:21197} The 10-year ASCVD risk score [name], et al., 2013) is: 50.9%   Values used to calculate the score:     Age: 44 years     Sex: Male     Is Non-Hispanic African American: No     Diabetic: Yes     Tobacco smoker: No     Systolic Blood Pressure: 308 mmHg     Is BP treated: Yes     HDL Cholesterol: 43 mg/dL     Total Cholesterol:  156 mg/dL    Depression screen [facility] 2/9 06/29/2020 05/11/2019 04/07/2019  Decreased Interest 0 0 0  Down, Depressed, Hopeless 0 0 0  PHQ - 2 Score 0 0 0  Altered sleeping - 0 0  Tired, decreased energy - 0 0  Change in appetite - 0 0  Feeling bad or failure about yourself  - 0 0  Trouble concentrating - 0 0  Moving slowly or fidgety/restless - 0 0  Suicidal thoughts - 0 0  PHQ-9 Score - 0 0  Difficult doing work/chores - Not difficult at all Not difficult at all    Social History   Tobacco Use  Smoking Status Former Smoker  . Packs/day: 1.00  . Types: Cigarettes  . Quit date: 11/26/1999  . Years since quitting: 21.1  Smokeless Tobacco Never Used   BP Readings from Last 3 Encounters:  11/21/20 127/69  10/04/20 (!) 154/89  08/31/20 140/87   Pulse Readings from Last 3 Encounters:  11/21/20 64  10/04/20 71  08/31/20 (!) 52   Wt Readings from Last 3 Encounters:  11/21/20 206 lb (93.4 kg)  10/04/20 216 lb (98 kg)  08/11/20 224 lb 11.2 oz (101.9 kg)    Assessment/Interventions: Review of patient past medical history, allergies, medications, health status, including review of consultants reports, laboratory and other test data, was performed as part of comprehensive evaluation and provision of chronic care management services.   SDOH:  (Social Determinants of Health) assessments and interventions performed: {yes/no:20286}   CCM Care Plan  Allergies  Allergen Reactions  . Amlodipine Besylate Swelling    Had a reaction when taking with colcrys   . Crestor [Rosuvastatin]     Muscle cramps and pain  . Rocephin [Ceftriaxone]     unknown    Medications Reviewed Today    Reviewed by Julieta Bellini, CMA (Certified Medical Assistant) on 11/21/20 at 1440  Med List Status: <None>  Medication Order Taking? Sig Documenting Provider Last Dose Status Informant  allopurinol (ZYLOPRIM) 300 MG tablet [id number] Yes TAKE 2 TABLETS(600 MG) BY MOUTH DAILY Birdie Sons, MD Taking Active    aspirin 81 MG EC tablet [id number] Yes Take 81 mg by mouth daily.  [provider] Taking Active Self           Med Note Penni Homans Aug 10, 2020  1:31 AM) Pt given 4 ASA tablets in hospital  atorvastatin (LIPITOR) 40 MG tablet [id number] Yes TAKE 1 TABLET BY MOUTH EVERY DAY Birdie Sons, MD Taking Active Self  calcium carbonate (TUMS - DOSED IN MG ELEMENTAL CALCIUM) 500 MG chewable tablet [id number] Yes Chew 1 tablet by mouth daily. [provider] Taking Active Self  carvedilol (COREG) 12.5 MG tablet [id number] Yes Take 1 tablet (12.5 mg total) by mouth 2 (two) times daily with a meal. Jennye Boroughs, MD Taking Active   Cyanocobalamin (VITAMIN B-12 CR) 1000 MCG TBCR [id number] Yes Take 1,000 mcg by mouth daily.  [provider] Taking Active Self           Med Note Jilda Roche A   Mon Oct 14, 2016  2:46 PM)    fluticasone furoate-vilanterol (BREO ELLIPTA) 100-25 MCG/INH AEPB [id number] No Inhale 1 puff into the lungs daily as needed.  Patient not taking: Reported on 11/21/2020   Birdie Sons, MD Not Taking Active Self  furosemide (LASIX) 20 MG tablet [id number] No Take 40 mg by mouth daily.   Patient not taking: Reported on 11/21/2020   [provider] Not Taking Active Self  GLUCOSAMINE HCL PO [id number] Yes Take 1,500 mg by mouth daily.  [provider] Taking Active Self  glucose blood (ONETOUCH VERIO) test strip [id number] Yes Use as instructed to check sugar daily Birdie Sons, MD Taking Active Self  losartan (COZAAR) 50 MG tablet [id number] Yes TAKE 1 TABLET BY MOUTH DAILY Birdie Sons, MD Taking Active Self  magnesium oxide (MAG-OX) 400 MG tablet [id number] Yes Take 800 mg by mouth 2 (two) times daily.  [provider] Taking Active Self  metFORMIN (GLUCOPHAGE-XR) 500 MG 24 hr tablet [id number] Yes TAKE 1 TABLET(500 MG) BY MOUTH DAILY WITH BREAKFAST Birdie Sons, MD Taking Active   pantoprazole (PROTONIX) 40 MG  tablet [id number] Yes TAKE 1 TABLET BY MOUTH EVERY DAY Birdie Sons, MD Taking Active Self  potassium chloride SA (KLOR-CON) 20 MEQ tablet [id number] Yes Take 20 mEq by mouth daily. [provider] Taking Active   torsemide (DEMADEX) 20 MG tablet [id number] Yes Take 20 mg by mouth 2 (two) times daily. [provider] Taking Active   triamcinolone ointment (KENALOG) 0.1 % [id number] Yes Apply 1 application topically 2 (two) times daily. As directed [provider] Taking Active Self          Patient Active Problem List   Diagnosis Date Noted  . Diabetes mellitus without complication ([place]) 34/01/5247  . Diarrhea 09/12/2020  . Acute kidney injury superimposed on CKD ([place]) 08/10/2020  . Chest pain 08/09/2020  . Hypomagnesemia 04/26/2020  . Hypocalcemia 04/19/2020  . Numbness and tingling in both hands 04/19/2020  . Atrial fibrillation ([place]) 04/19/2020  . Other specified interstitial pulmonary diseases ([place]) 12/06/2019  . Abdominal pain   . Suspected COVID-19 virus infection 11/22/2019  . Pancreatitis 11/22/2019  . Leukocytosis 11/22/2019  . CAP (community acquired pneumonia) 11/13/2019  . Hyperuricemia 03/31/2019  . Numbness 05/07/2018  . Benign colonic polyp 03/24/2017  . Tibialis anterior tenosynovitis 12/21/2015  . Pleural nodule 07/27/2015  . Psoriatic arthritis ([place]) 05/12/2015  . Restrictive lung disease 05/12/2015  . Barrett esophagus 03/30/2015  . Ache in joint 03/30/2015  . Bradycardia 03/30/2015  . CAD (coronary artery disease) 03/30/2015  . Cardiac defibrillator in place 03/30/2015  . Chronic kidney disease (CKD), stage III (moderate) ([place]) 03/30/2015  . Cluster headache syndrome 03/30/2015  . Degeneration of lumbar or lumbosacral intervertebral disc 03/30/2015  . Gout 03/30/2015  . Personal history of malignant neoplasm of bladder 03/30/2015  . Hypercholesteremia 03/30/2015  . LBP (low back pain) 03/30/2015  . Personal history of  malignant melanoma of skin 03/30/2015  . Muscle ache 03/30/2015  . Arthritis of knee, degenerative 03/30/2015  . Prediabetes 03/30/2015  .  Psoriasis 03/30/2015  . Obstructive sleep apnea 03/30/2015  . Chronic venous insufficiency 03/30/2015  . History of abdominal aortic aneurysm (AAA) 06/24/2013  . History of CVA (cerebrovascular accident) 06/24/2013  . Cardiomyopathy, ischemic 06/24/2013  . History of AAA (abdominal aortic aneurysm) repair 06/24/2013  . Essential (primary) hypertension 08/17/2011  . Arrhythmia, sinus node 08/17/2011  . History of ventricular fibrillation 08/17/2011  . Systolic CHF with reduced left ventricular function, NYHA class 3 ([place]) 02/26/2010  . 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 Cardiac pacemaker in situ 10/10/2006    Immunization History  Administered Date(s) Administered  . Fluad Quad(high Dose 65+) 10/04/2020  . Influenza Split 09/18/2011  . Influenza, High Dose Seasonal PF 08/16/2014, 10/09/2016, 10/09/2017, 08/26/2018  . Influenza,inj,Quad PF,6+ Mos 12/20/2015  . PFIZER(Purple Top)SARS-COV-2 Vaccination 02/24/2020, 03/21/2020  . Pneumococcal Conjugate-13 05/09/2014  . Pneumococcal Polysaccharide-23 05/12/2015    Conditions to be addressed/monitored:  Hypertension, Hyperlipidemia, Diabetes, Atrial Fibrillation, Heart Failure, Coronary Artery Disease, Chronic Kidney Disease and Gout  There are no care plans that you recently modified to display for this patient.    Medication Assistance: {MEDASSISTANCEINFO:25044}  Patient's preferred pharmacy is:  [id number] [address], [place] AT [facility] OF [address] [place] [address] Phone: [phone] Fax: [phone]  Uses pill box? {Yes or If no, why not?:20788} Pt endorses ***% compliance  We discussed: {Pharmacy options:24294} Patient decided to: {US Pharmacy Plan:23885}  Care Plan and Follow Up Patient  Decision:  {FOLLOWUP:24991}  Plan: {CM FOLLOW UP PLAN:25073}  ***   Current Barriers:  . {pharmacybarriers:24917} . ***  Pharmacist Clinical Goal(s):  Marland Kitchen Over the next *** days, patient will {PHARMACYGOALCHOICES:24921} through collaboration with PharmD and provider.  . ***  Interventions: . 1:1 collaboration with Birdie Sons, MD regarding development and update of comprehensive plan of care as evidenced by provider attestation and co-signature . Inter-disciplinary care team collaboration (see longitudinal plan of care) . Comprehensive medication review performed; medication list updated in electronic medical record  Hyperlipidemia: (LDL goal < 77) -History of CAD, history of CVA  -{US controlled/uncontrolled:25276} -Current treatment: . Atorvastatin 40 mg daily  -Medications previously tried: ***  -Current dietary patterns: *** -Current exercise habits: *** -Educated on {CCM HLD Counseling:25126} -{CCMPHARMDINTERVENTION:25122}  Diabetes (A1c goal <8%) -Diagnosed Jan 2021 -Uncontrolled -Current medications: Marland Kitchen Metformin XR 500 mg 2 tablets daily  -Medications previously tried: ***  -Current home glucose readings . fasting glucose: *** . post prandial glucose: *** -{ACTIONS;DENIES/REPORTS:21021675::"Denies"} hypoglycemic/hyperglycemic symptoms -Current meal patterns:  . breakfast: ***  . lunch: ***  . dinner: *** . snacks: *** . drinks: *** -Current exercise: *** -Educated on{CCM DM COUNSELING:25123} -Counseled to check feet daily and get yearly eye exams -{CCMPHARMDINTERVENTION:25122}  Atrial Fibrillation (Goal: prevent stroke and major bleeding) -{US controlled/uncontrolled:25276} -CHADSVASC: 8 -Current treatment: . Rate control: Carvedilol 12.5 mg daily  . Anticoagulation: Eliquis?  -Medications previously tried: *** -Home BP and HR readings: ***  -Counseled on {CCMAFIBCOUNSELING:25120} -{CCMPHARMDINTERVENTION:25122}  Heart Failure (Goal: control  symptoms and prevent exacerbations) -{US RRNHAFBXUX/YBFXOVANVBTY:60600} Type: Systolic -NYHA Class: III (marked limitation of activity) -Ejection fraction: 35-40% (Date: Sep 2021) -Current treatment:  Carvedilol 12.5 mg daily   Losartan 50 mg daily   Torsemide 20 mg twice daily  -Medications previously tried: *** -Current home BP/HR readings: *** -Current dietary habits: *** -Current exercise routine: *** -Educated on {CCM HF Counseling:25125} -{CCMPHARMDINTERVENTION:25122}  Gout (Goal: ***) -{US controlled/uncontrolled:25276} -Current treatment  .  Allopurinol 300 mg 2 tablets daily  -Medications previously tried: ***  -{CCMPHARMDINTERVENTION:25122}  GERD (Goal: ***) -History of Barrett's Esophagus  -{US controlled/uncontrolled:25276} -Current treatment  . Pantoprazole 40 mg daily  -Medications previously tried: ***  -{CCMPHARMDINTERVENTION:25122}     Patient Goals/Self-Care Activities . Over the next *** days, patient will:  - {pharmacypatientgoals:24919}  Follow Up Plan: {CM FOLLOW UP ZRVU:34144}

## 2021-01-23 NOTE — Patient Instructions (Addendum)
Visit Information It was great speaking with you today!  Please let me know if you have any questions about our visit.  Goals Addressed            This Visit's Progress   . Monitor and Manage My Blood Sugar-Diabetes Type 2       Timeframe:  Long-Range Goal Priority:  High Start Date:  01/23/2021                           Expected End Date: 07/26/2021                  Follow Up Date 03/24/2021    -Check blood sugar once daily, before breakfast  - check blood sugar if I feel it is too high or too low - enter blood sugar readings and medication or insulin into daily log    Why is this important?    Checking your blood sugar at home helps to keep it from getting very high or very low.   Writing the results in a diary or log helps the doctor know how to care for you.   Your blood sugar log should have the time, date and the results.   Also, write down the amount of insulin or other medicine that you take.   Other information, like what you ate, exercise done and how you were feeling, will also be helpful.     Notes:        Patient Care Plan: General Pharmacy (Adult)    Problem Identified: Hypertension, Hyperlipidemia, Diabetes, Atrial Fibrillation, Heart Failure, Coronary Artery Disease, Chronic Kidney Disease and Gout   Priority: High    Long-Range Goal: Patient-Specific Goal   Start Date: 01/23/2021  Expected End Date: 07/26/2021  This Visit's Progress: On track  Priority: High  Note:   Current Barriers:  . Unable to independently afford treatment regimen . Unable to achieve control of diabetes   Pharmacist Clinical Goal(s):  Marland Kitchen Over the next 90 days, patient will verbalize ability to afford treatment regimen . achieve control of diabetes as evidenced by A1c less than 8% through collaboration with PharmD and provider.   Interventions: . 1:1 collaboration with Birdie Sons, MD regarding development and update of comprehensive plan of care as evidenced by provider  attestation and co-signature . Inter-disciplinary care team collaboration (see longitudinal plan of care) . Comprehensive medication review performed; medication list updated in electronic medical record  Hyperlipidemia: (LDL goal < 70) -History of CAD, history of CVA  -Not ideally controlled -Current treatment: . Atorvastatin 40 mg daily  -Medications previously tried: NA  -Educated on Benefits of statin for ASCVD risk reduction; Importance of limiting foods high in cholesterol; -Recommended to continue current medication  Diabetes (A1c goal <8%) -Diagnosed Jan 2021 -Uncontrolled -Current medications: Marland Kitchen Metformin XR 500 mg 2 tablets daily  -Medications previously tried: NA  -Current home glucose readings . fasting glucose: 187, 135 . post prandial glucose: 191 -Denies hypoglycemic/hyperglycemic symptoms - Patient reports lack of energy, soreness all over both legs. Feels this has gotten worse since increasing metformin. Patient also reports frequent loose stools since increasing metformin, but that they have been manageable and slowly improving.  -Current meal patterns:  . breakfast: A couple of eggs + toast. Sometimes french toast, pancakes. Coffee + 2 artificial sugars  . lunch: typically skips . dinner: Shrimp. Air fried chicken + mashed potatoes. Pork + beans  . Snacks: rarely, but will  have crackers  . drinks: only water.  -Current exercise: Not currently, trying to get back into training program. Legs have been a limiting factor. - Patient has lost 40 pounds in last 6 months, unsure why.  -Educated onA1c and blood sugar goals; Complications of diabetes including kidney damage, retinal damage, and cardiovascular disease; Benefits of weight loss; Prevention and management of hypoglycemic episodes; Carbohydrate counting and/or plate method -Counseled to check feet daily and get yearly eye exams -Recommended increasing metformin XR to 500 mg 2 tablets with breakfast, 1 tablet  with supper  Atrial Fibrillation (Goal: prevent stroke and major bleeding) -Controlled -CHADSVASC: 8 -Current treatment: . Rate control: Carvedilol 12.5 mg daily  . Anticoagulation: Eliquis 5 mg twice daily   -Medications previously tried: NA -Home BP and HR readings: NA  -Counseled on increased risk of stroke due to Afib and benefits of anticoagulation for stroke prevention; importance of adherence to anticoagulant exactly as prescribed; bleeding risk associated with Eliquis and importance of self-monitoring for signs/symptoms of bleeding; avoidance of NSAIDs due to increased bleeding risk with anticoagulants; importance of regular laboratory monitoring;  - Patient reports frequent nose bleeds that last 3-4 hours. Counseled patient on nose bleed management Nose bleeds every other day. 3-4 hours  -Recommended to continue current medication Assessed patient finances. Will start patient assistance for Eliquis  Heart Failure (Goal: control symptoms and prevent exacerbations) -Controlled Type: Systolic -NYHA Class: III (marked limitation of activity) -Ejection fraction: 35-40% (Date: Sep 2021) -Current treatment:  Carvedilol 12.5 mg daily   Losartan 50 mg daily   Torsemide 20 mg twice daily  -Medications previously tried: Entresto, Amlodipine, valsartan, spironolactone,  -Current home BP/HR readings: NA -weight has been stable, ranging from 187-195  -Educated on Importance of weighing daily; if you gain more than 3 pounds in one day or 5 pounds in one week, contact provider's office Importance of blood pressure control -Recommend STOPPING Carvedilol due to less than ideal dosing regimen and possible impact on patient's fatigue.  -Recommend STARTING metoprolol XL 50 mg daily   Gout (Goal: prevent gout flares) -Controlled -Current treatment  . Allopurinol 300 mg 2 tablets daily  -Medications previously tried: NA  -Last flare: 2 years  -Counseled on diet and exercise  extensively Recommended to continue current medication  GERD (Goal: Minimize symptoms of heartburn or reflux) -History of Barrett's Esophagus  -Controlled -Current treatment  . Pantoprazole 40 mg daily  -Medications previously tried: NA  -Recommended to continue current medication  Patient Goals/Self-Care Activities . Over the next 90 days, patient will:  - check glucose daily before breakfast, document, and provide at future appointments check blood pressure 2-3 times weekly , document, and provide at future appointments weigh daily, and contact provider if weight gain of greater than 3 pounds in one day  Follow Up Plan: Telephone follow up appointment with care management team member scheduled for: 04/27/2021 at 9:00 AM      Taylor [name] was given information about Chronic Care Management services today including:  1. CCM service includes personalized support from designated clinical staff supervised by his physician, including individualized plan of care and coordination with other care providers 2. 24/7 contact phone numbers for assistance for urgent and routine care needs. 3. Standard insurance, coinsurance, copays and deductibles apply for chronic care management only during months in which we provide at least 20 minutes of these services. Most insurances cover these services at 100%, however patients may be responsible for any copay, coinsurance and/or deductible if applicable.  This service may help you avoid the need for more expensive face-to-face services. 4. Only one practitioner may furnish and bill the service in a calendar month. 5. The patient may stop CCM services at any time (effective at the end of the month) by phone call to the office staff.  Patient agreed to services and verbal consent obtained.   The patient verbalized understanding of instructions, educational materials, and care plan provided today and agreed to receive a mailed copy of patient instructions,  educational materials, and care plan.   [place] [phone]    Nosebleed, Adult A nosebleed is when blood comes out of the nose. Nosebleeds are common and can be caused by many things. They are usually not a sign of a serious medical problem. Follow these instructions at home: When you have a nosebleed:  Sit down.  Tilt your head a little forward.  Follow these steps: 1. Pinch your nose with a clean towel or tissue. 2. Keep pinching your nose for 5 minutes. Do not let go. 3. After 5 minutes, let go of your nose. 4. If there is still bleeding, do these steps again. Keep doing these steps until the bleeding stops.  Do not put tissues or other things in your nose to stop the bleeding.  Avoid lying down or putting your head back.  Use a nose spray decongestant as told by your doctor.   After a nosebleed:  Try not to blow your nose or sniffle for several hours.  Try not to strain, lift, or bend at the waist for several days.  Aspirin and blood-thinning medicines make bleeding more likely. If you take these medicines: ? Ask your doctor if you should stop taking them or if you should change how much you take. ? Do not stop taking the medicine unless your doctor tells you to.  If your nosebleed was caused by dryness, use over-the-counter saline nasal spray or gel and humidifier as told by your doctor. This will keep the inside of your nose moist and allow it to heal. If you need to use one of these products: ? Choose one that is water-soluble. ? Use only as much as you need and use it only as often as needed. ? Do not lie down right away after you use it.  If you get nosebleeds often, talk with your doctor about treatments. These may include: ? Nasal cautery. A chemical swab or electrical device is used to lightly burn tiny blood vessels inside the nose. This helps stop or prevent nosebleeds. ? Nasal packing. A gauze or  other material is placed in the nose to keep constant pressure on the bleeding area. Contact a doctor if:  You have a fever.  You get nosebleeds often.  You are getting nosebleeds more often than usual.  You bruise very easily.  You have something stuck in your nose.  You have bleeding in your mouth.  You vomit or cough up brown material.  You get a nosebleed after you start a new medicine. Get help right away if:  You have a nosebleed after you fall or hurt your head.  Your nosebleed does not go away after 20 minutes.  You feel dizzy or weak.  You have unusual bleeding from other parts of your body.  You have unusual bruising on other parts of your body.  You get sweaty.  You vomit blood. Summary  Nosebleeds are common. They are usually not a sign of  a serious medical problem.  When you have a nosebleed, sit down and tilt your head a little forward. Pinch your nose with a clean tissue for 5 minutes.  Use saline spray or saline gel and a humidifier as told by your doctor.  Get help right away if your nosebleed does not go away after 20 minutes. This information is not intended to replace advice given to you by your health care provider. Make sure you discuss any questions you have with your health care provider. Document Revised: 09/09/2019 Document Reviewed: 09/09/2019 Elsevier Patient Education  2021 [place].  [email or web address]">  DASH Eating Plan DASH stands for Dietary Approaches to Stop Hypertension. The DASH eating plan is a healthy eating plan that has been shown to:  Reduce high blood pressure (hypertension).  Reduce your risk for type 2 diabetes, heart disease, and stroke.  Help with weight loss. What are tips for following this plan? Reading food labels  Check food labels for the amount of salt (sodium) per serving. Choose foods with less than 5 percent of the Daily Value of sodium. Generally,  foods with less than 300 milligrams (mg) of sodium per serving fit into this eating plan.  To find whole grains, look for the word "whole" as the first word in the ingredient list. Shopping  Buy products labeled as "low-sodium" or "no salt added."  Buy fresh foods. Avoid canned foods and pre-made or frozen meals. Cooking  Avoid adding salt when cooking. Use salt-free seasonings or herbs instead of table salt or sea salt. Check with your health care provider or pharmacist before using salt substitutes.  Do not fry foods. Cook foods using healthy methods such as baking, boiling, grilling, roasting, and broiling instead.  Cook with heart-healthy oils, such as olive, canola, avocado, soybean, or sunflower oil. Meal planning  Eat a balanced diet that includes: ? 4 or more servings of fruits and 4 or more servings of vegetables each day. Try to fill one-half of your plate with fruits and vegetables. ? 6-8 servings of whole grains each day. ? Less than 6 oz (170 g) of lean meat, poultry, or fish each day. A 3-oz (85-g) serving of meat is about the same size as a deck of cards. One egg equals 1 oz (28 g). ? 2-3 servings of low-fat dairy each day. One serving is 1 cup (237 mL). ? 1 serving of nuts, seeds, or beans 5 times each week. ? 2-3 servings of heart-healthy fats. Healthy fats called omega-3 fatty acids are found in foods such as walnuts, flaxseeds, fortified milks, and eggs. These fats are also found in cold-water fish, such as sardines, salmon, and mackerel.  Limit how much you eat of: ? Canned or prepackaged foods. ? Food that is high in trans fat, such as some fried foods. ? Food that is high in saturated fat, such as fatty meat. ? Desserts and other sweets, sugary drinks, and other foods with added sugar. ? Full-fat dairy products.  Do not salt foods before eating.  Do not eat more than 4 egg yolks a week.  Try to eat at least 2 vegetarian meals a week.  Eat more home-cooked  food and less restaurant, buffet, and fast food.   Lifestyle  When eating at a restaurant, ask that your food be prepared with less salt or no salt, if possible.  If you drink alcohol: ? Limit how much you use to:  0-1 drink a day for women who are not pregnant.  0-2 drinks a day for men. ? Be aware of how much alcohol is in your drink. In the [place] one drink equals one 12 oz bottle of beer (355 mL), one 5 oz glass of wine (148 mL), or one 1 oz glass of hard liquor (44 mL). General information  Avoid eating more than 2,300 mg of salt a day. If you have hypertension, you may need to reduce your sodium intake to 1,500 mg a day.  Work with your health care provider to maintain a healthy body weight or to lose weight. Ask what an ideal weight is for you.  Get at least 30 minutes of exercise that causes your heart to beat faster (aerobic exercise) most days of the week. Activities may include walking, swimming, or biking.  Work with your health care provider or dietitian to adjust your eating plan to your individual calorie needs. What foods should I eat? Fruits All fresh, dried, or frozen fruit. Canned fruit in natural juice (without added sugar). Vegetables Fresh or frozen vegetables (raw, steamed, roasted, or grilled). Low-sodium or reduced-sodium tomato and vegetable juice. Low-sodium or reduced-sodium tomato sauce and tomato paste. Low-sodium or reduced-sodium canned vegetables. Grains Whole-grain or whole-wheat bread. Whole-grain or whole-wheat pasta. Brown rice. [place]. Bulgur. Whole-grain and low-sodium cereals. Pita bread. Low-fat, low-sodium crackers. Whole-wheat flour tortillas. Meats and other proteins Skinless chicken or [place]. Ground chicken or [place]. Pork with fat trimmed off. Fish and seafood. Egg whites. Dried beans, peas, or lentils. Unsalted nuts, nut butters, and seeds. Unsalted canned beans. Lean cuts of beef with fat trimmed off. Low-sodium, lean precooked or  cured meat, such as sausages or meat loaves. Dairy Low-fat (1%) or fat-free (skim) milk. Reduced-fat, low-fat, or fat-free cheeses. Nonfat, low-sodium ricotta or cottage cheese. Low-fat or nonfat yogurt. Low-fat, low-sodium cheese. Fats and oils Soft margarine without trans fats. Vegetable oil. Reduced-fat, low-fat, or light mayonnaise and salad dressings (reduced-sodium). Canola, safflower, olive, avocado, soybean, and sunflower oils. Avocado. Seasonings and condiments Herbs. Spices. Seasoning mixes without salt. Other foods Unsalted popcorn and pretzels. Fat-free sweets. The items listed above may not be a complete list of foods and beverages you can eat. Contact a dietitian for more information. What foods should I avoid? Fruits Canned fruit in a light or heavy syrup. Fried fruit. Fruit in cream or butter sauce. Vegetables Creamed or fried vegetables. Vegetables in a cheese sauce. Regular canned vegetables (not low-sodium or reduced-sodium). Regular canned tomato sauce and paste (not low-sodium or reduced-sodium). Regular tomato and vegetable juice (not low-sodium or reduced-sodium). Angie Fava. Olives. Grains Baked goods made with fat, such as croissants, muffins, or some breads. Dry pasta or rice meal packs. Meats and other proteins Fatty cuts of meat. Ribs. Fried meat. Berniece Salines. Bologna, salami, and other precooked or cured meats, such as sausages or meat loaves. Fat from the back of a pig (fatback). Bratwurst. Salted nuts and seeds. Canned beans with added salt. Canned or smoked fish. Whole eggs or egg yolks. Chicken or [place] with skin. Dairy Whole or 2% milk, cream, and half-and-half. Whole or full-fat cream cheese. Whole-fat or sweetened yogurt. Full-fat cheese. Nondairy creamers. Whipped toppings. Processed cheese and cheese spreads. Fats and oils Butter. Stick margarine. Lard. Shortening. Ghee. Bacon fat. Tropical oils, such as coconut, palm kernel, or palm oil. Seasonings and  condiments Onion salt, garlic salt, seasoned salt, table salt, and sea salt. Worcestershire sauce. Tartar sauce. Barbecue sauce. Teriyaki sauce. Soy sauce, including reduced-sodium. Steak sauce. Canned and packaged gravies. Fish sauce. [employer]  sauce. Cocktail sauce. Store-bought horseradish. Ketchup. Mustard. Meat flavorings and tenderizers. Bouillon cubes. Hot sauces. Pre-made or packaged marinades. Pre-made or packaged taco seasonings. Relishes. Regular salad dressings. Other foods Salted popcorn and pretzels. The items listed above may not be a complete list of foods and beverages you should avoid. Contact a dietitian for more information. Where to find more information  National Heart, Lung, and Blood Institute: https://wilson-eaton.com/  American Heart Association: www.heart.org  Academy of Nutrition and Dietetics: www.eatright.[place]: www.kidney.org Summary  The DASH eating plan is a healthy eating plan that has been shown to reduce high blood pressure (hypertension). It may also reduce your risk for type 2 diabetes, heart disease, and stroke.  When on the DASH eating plan, aim to eat more fresh fruits and vegetables, whole grains, lean proteins, low-fat dairy, and heart-healthy fats.  With the DASH eating plan, you should limit salt (sodium) intake to 2,300 mg a day. If you have hypertension, you may need to reduce your sodium intake to 1,500 mg a day.  Work with your health care provider or dietitian to adjust your eating plan to your individual calorie needs. This information is not intended to replace advice given to you by your health care provider. Make sure you discuss any questions you have with your health care provider. Document Revised: 10/15/2019 Document Reviewed: 10/15/2019 Elsevier Patient Education  2021 [place].  [email or web address]">

## 2021-01-23 NOTE — Progress Notes (Signed)
Chronic Care Management Pharmacy Note  01/23/2021 Name:  Taylor [name] MRN:  [id number] DOB:  1941/05/19  Subjective: Taylor [name] is an 80 y.o. year old male who is a primary patient of [name], [name], MD.  The CCM team was consulted for assistance with disease management and care coordination needs.    Engaged with patient by telephone for initial visit in response to provider referral for pharmacy case management and/or care coordination services.   Consent to Services:  The patient was given the following information about Chronic Care Management services today, agreed to services, and gave verbal consent: 1. CCM service includes personalized support from designated clinical staff supervised by the primary care provider, including individualized plan of care and coordination with other care providers 2. 24/7 contact phone numbers for assistance for urgent and routine care needs. 3. Service will only be billed when office clinical staff spend 20 minutes or more in a month to coordinate care. 4. Only one practitioner may furnish and bill the service in a calendar month. 5.The patient may stop CCM services at any time (effective at the end of the month) by phone call to the office staff. 6. The patient will be responsible for cost sharing (co-pay) of up to 20% of the service fee (after annual deductible is met). Patient agreed to services and consent obtained.  Patient Care Team: Birdie Sons, MD as PCP - General (Family Medicine) Corey Skains, MD as Consulting Physician (Cardiology) Birder Robson, MD as Referring Physician (Ophthalmology) Ottie Glazier, MD as Consulting Physician (Pulmonary Disease) Germaine Pomfret, [facility] (Pharmacist)  Recent office visits: 11/21/20: Patient presented to Dr. [name] Section for follow-up. Breo Elipta and furosemide stopped.   Recent consult visits: 11/30/20: Patient presented to Hassell Done, NP (Cardiology) for follow-up. Eliquis 5 mg twice daily  started for anticoagulation.   Hospital visits: None in previous 6 months  Objective:  Lab Results  Component Value Date   CREATININE 1.31 (H) 11/21/2020   BUN 19 11/21/2020   GFRNONAA 52 (L) 11/21/2020   GFRAA 60 11/21/2020   NA 144 11/21/2020   K 4.1 11/21/2020   CALCIUM 9.1 11/21/2020   CO2 26 11/21/2020    Lab Results  Component Value Date/Time   HGBA1C 8.2 (H) 10/04/2020 09:05 AM   HGBA1C 7.7 (A) 12/14/2019 10:31 AM   HGBA1C 6.3 (H) 05/12/2019 09:14 AM    Last diabetic Eye exam: No results found for: HMDIABEYEEXA  Last diabetic Foot exam: No results found for: HMDIABFOOTEX   Lab Results  Component Value Date   CHOL 156 11/22/2019   HDL 43 11/22/2019   LDLCALC 77 11/22/2019   TRIG 182 (H) 11/22/2019   CHOLHDL 3.6 11/22/2019    Hepatic Function Latest Ref Rng & Units 11/21/2020 10/04/2020 06/30/2020  Total Protein 6.5 - 8.1 g/dL - - -  Albumin 3.7 - 4.7 g/dL 3.8 4.0 3.6(L)  AST 15 - 41 U/L - - -  ALT 0 - 44 U/L - - -  Alk Phosphatase 38 - 126 U/L - - -  Total Bilirubin 0.3 - 1.2 mg/dL - - -  Bilirubin, Direct 0.0 - 0.2 mg/dL - - -    Lab Results  Component Value Date/Time   TSH 1.331 11/23/2019 04:58 AM   TSH 3.430 05/15/2015 09:08 AM    CBC Latest Ref Rng & Units 08/11/2020 08/10/2020 08/09/2020  WBC 4.0 - 10.5 K/uL 9.4 11.5(H) 11.8(H)  Hemoglobin 13.0 - 17.0 g/dL 13.3 13.8 13.3  Hematocrit 39.0 - 52.0 % 41.6 42.5 42.3  Platelets 150 - 400 K/uL 105(L) 122(L) 129(L)    Lab Results  Component Value Date/Time   VD25OH 46.58 04/19/2020 04:16 PM   VD25OH 47.4 05/15/2015 09:08 AM    Clinical ASCVD: No  The 10-year ASCVD risk score [name], et al., 2013) is: 50.9%   Values used to calculate the score:     Age: 41 years     Sex: Male     Is Non-Hispanic African American: No     Diabetic: Yes     Tobacco smoker: No     Systolic Blood Pressure: 194 mmHg     Is BP treated: Yes     HDL Cholesterol: 43 mg/dL     Total Cholesterol: 156 mg/dL     Depression screen [facility] 2/9 06/29/2020 05/11/2019 04/07/2019  Decreased Interest 0 0 0  Down, Depressed, Hopeless 0 0 0  PHQ - 2 Score 0 0 0  Altered sleeping - 0 0  Tired, decreased energy - 0 0  Change in appetite - 0 0  Feeling bad or failure about yourself  - 0 0  Trouble concentrating - 0 0  Moving slowly or fidgety/restless - 0 0  Suicidal thoughts - 0 0  PHQ-9 Score - 0 0  Difficult doing work/chores - Not difficult at all Not difficult at all    Social History   Tobacco Use  Smoking Status Former Smoker  . Packs/day: 1.00  . Types: Cigarettes  . Quit date: 11/26/1999  . Years since quitting: 21.1  Smokeless Tobacco Never Used   BP Readings from Last 3 Encounters:  11/21/20 127/69  10/04/20 (!) 154/89  08/31/20 140/87   Pulse Readings from Last 3 Encounters:  11/21/20 64  10/04/20 71  08/31/20 (!) 52   Wt Readings from Last 3 Encounters:  11/21/20 206 lb (93.4 kg)  10/04/20 216 lb (98 kg)  08/11/20 224 lb 11.2 oz (101.9 kg)    Assessment/Interventions: Review of patient past medical history, allergies, medications, health status, including review of consultants reports, laboratory and other test data, was performed as part of comprehensive evaluation and provision of chronic care management services.   SDOH:  (Social Determinants of Health) assessments and interventions performed: Yes SDOH Interventions   Flowsheet Row Most Recent Value  SDOH Interventions   Financial Strain Interventions Other (Comment)  [PAP]      CCM Care Plan  Allergies  Allergen Reactions  . Amlodipine Besylate Swelling    Had a reaction when taking with colcrys   . Crestor [Rosuvastatin]     Muscle cramps and pain  . Rocephin [Ceftriaxone]     unknown    Medications Reviewed Today    Reviewed by Germaine Pomfret, [facility] (Pharmacist) on 01/23/21 at 1410  Med List Status: <None>  Medication Order Taking? Sig Documenting Provider Last Dose Status Informant  allopurinol (ZYLOPRIM)  300 MG tablet [id number] Yes TAKE 2 TABLETS(600 MG) BY MOUTH DAILY [name], [name], MD Taking Active   apixaban (ELIQUIS) 5 MG TABS tablet [id number] Yes Take 5 mg by mouth 2 (two) times daily. [provider] Taking Active            Med Note Michaelle Birks, [place] Jan 23, 2021  2:10 PM) Prescribed by Hassell Done, NP  aspirin 81 MG EC tablet [id number] Yes Take 81 mg by mouth daily.  [provider] Taking Active Self  Med Note Penni Homans Aug 10, 2020  1:31 AM) Pt given 4 ASA tablets in hospital  atorvastatin (LIPITOR) 40 MG tablet [id number] Yes TAKE 1 TABLET BY MOUTH EVERY DAY Birdie Sons, MD Taking Active   Calcium Carbonate Antacid 600 MG chewable tablet [id number] Yes Chew 1 tablet by mouth daily. [provider] Taking Active Self  carvedilol (COREG) 12.5 MG tablet [id number] Yes TAKE 1 TABLET BY MOUTH DAILY [name], [name], MD Taking Active   Cyanocobalamin (B-12) 2500 MCG TABS [id number] Yes Take 2,500 mcg by mouth daily. [provider] Taking Active   GLUCOSAMINE HCL PO [id number] Yes Take 1,500 mg by mouth daily.  [provider] Taking Active Self  glucose blood (ONETOUCH VERIO) test strip [id number]  Use as instructed to check sugar daily Birdie Sons, MD  Active Self  losartan (COZAAR) 50 MG tablet [id number] Yes TAKE 1 TABLET BY MOUTH DAILY Birdie Sons, MD Taking Active Self  magnesium oxide (MAG-OX) 400 MG tablet [id number] Yes Take 500 mg by mouth 2 (two) times daily. [provider] Taking Active Self  metFORMIN (GLUCOPHAGE-XR) 500 MG 24 hr tablet [id number] Yes Take 2 tablets (1,000 mg total) by mouth daily. Birdie Sons, MD Taking Active   pantoprazole (PROTONIX) 40 MG tablet [id number] Yes TAKE 1 TABLET BY MOUTH EVERY DAY Birdie Sons, MD Taking Active Self  potassium chloride SA (KLOR-CON) 20 MEQ tablet [id number] Yes Take 20 mEq by mouth daily. [provider] Taking Active             Med Note Michaelle Birks, [place] Jan 23, 2021  2:10 PM) Prescribed by Hassell Done, NP   torsemide (DEMADEX) 20 MG tablet [id number] Yes Take 20 mg by mouth 2 (two) times daily. [provider] Taking Active            Med Note Michaelle Birks, [place] Jan 23, 2021  2:10 PM) Prescribed by Dr. Nehemiah Massed   triamcinolone ointment (KENALOG) 0.1 % [id number] Yes Apply 1 application topically 2 (two) times daily. As directed [provider] Taking Active Self          Patient Active Problem List   Diagnosis Date Noted  . Diabetes mellitus without complication ([place]) 76/28/3151  . Diarrhea 09/12/2020  . Acute kidney injury superimposed on CKD ([place]) 08/10/2020  . Chest pain 08/09/2020  . Hypomagnesemia 04/26/2020  . Hypocalcemia 04/19/2020  . Numbness and tingling in both hands 04/19/2020  . Atrial fibrillation ([place]) 04/19/2020  . Other specified interstitial pulmonary diseases ([place]) 12/06/2019  . Abdominal pain   . Suspected COVID-19 virus infection 11/22/2019  . Pancreatitis 11/22/2019  . Leukocytosis 11/22/2019  . CAP (community acquired pneumonia) 11/13/2019  . Hyperuricemia 03/31/2019  . Numbness 05/07/2018  . Benign colonic polyp 03/24/2017  . Tibialis anterior tenosynovitis 12/21/2015  . Pleural nodule 07/27/2015  . Psoriatic arthritis ([place]) 05/12/2015  . Restrictive lung disease 05/12/2015  . Barrett esophagus 03/30/2015  . Ache in joint 03/30/2015  . Bradycardia 03/30/2015  . CAD (coronary artery disease) 03/30/2015  . Cardiac defibrillator in place 03/30/2015  . Chronic kidney disease (CKD), stage III (moderate) ([place]) 03/30/2015  . Cluster headache syndrome 03/30/2015  . Degeneration of lumbar or lumbosacral intervertebral disc 03/30/2015  . Gout 03/30/2015  . Personal history of malignant neoplasm of bladder 03/30/2015  . Hypercholesteremia 03/30/2015  . LBP (low back pain) 03/30/2015  . Personal history of malignant melanoma of  skin 03/30/2015   . Muscle ache 03/30/2015  . Arthritis of knee, degenerative 03/30/2015  . Prediabetes 03/30/2015  . Psoriasis 03/30/2015  . Obstructive sleep apnea 03/30/2015  . Chronic venous insufficiency 03/30/2015  . History of abdominal aortic aneurysm (AAA) 06/24/2013  . History of CVA (cerebrovascular accident) 06/24/2013  . Cardiomyopathy, ischemic 06/24/2013  . History of AAA (abdominal aortic aneurysm) repair 06/24/2013  . Essential (primary) hypertension 08/17/2011  . Arrhythmia, sinus node 08/17/2011  . History of ventricular fibrillation 08/17/2011  . Systolic CHF with reduced left ventricular function, NYHA class 3 ([place]) 02/26/2010  . 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 Cardiac pacemaker in situ 10/10/2006    Immunization History  Administered Date(s) Administered  . Fluad Quad(high Dose 65+) 10/04/2020  . Influenza Split 09/18/2011  . Influenza, High Dose Seasonal PF 08/16/2014, 10/09/2016, 10/09/2017, 08/26/2018  . Influenza,inj,Quad PF,6+ Mos 12/20/2015  . PFIZER(Purple Top)SARS-COV-2 Vaccination 02/24/2020, 03/21/2020  . Pneumococcal Conjugate-13 05/09/2014  . Pneumococcal Polysaccharide-23 05/12/2015    Conditions to be addressed/monitored:  Hypertension, Hyperlipidemia, Diabetes, Atrial Fibrillation, Heart Failure, Coronary Artery Disease, Chronic Kidney Disease and Gout  Care Plan : General Pharmacy (Adult)  Updates made by Germaine Pomfret, RPH since 01/23/2021 12:00 AM    Problem: Hypertension, Hyperlipidemia, Diabetes, Atrial Fibrillation, Heart Failure, Coronary Artery Disease, Chronic Kidney Disease and Gout   Priority: High    Long-Range Goal: Patient-Specific Goal   Start Date: 01/23/2021  Expected End Date: 07/26/2021  This Visit's Progress: On track  Priority: High  Note:   Current Barriers:  . Unable to independently afford treatment regimen . Unable to achieve control of diabetes   Pharmacist Clinical Goal(s):  Marland Kitchen Over the  next 90 days, patient will verbalize ability to afford treatment regimen . achieve control of diabetes as evidenced by A1c less than 8% through collaboration with PharmD and provider.   Interventions: . 1:1 collaboration with Birdie Sons, MD regarding development and update of comprehensive plan of care as evidenced by provider attestation and co-signature . Inter-disciplinary care team collaboration (see longitudinal plan of care) . Comprehensive medication review performed; medication list updated in electronic medical record  Hyperlipidemia: (LDL goal < 70) -History of CAD, history of CVA  -Not ideally controlled -Current treatment: . Atorvastatin 40 mg daily  -Medications previously tried: NA  -Educated on Benefits of statin for ASCVD risk reduction; Importance of limiting foods high in cholesterol; -Recommended to continue current medication  Diabetes (A1c goal <8%) -Diagnosed Jan 2021 -Uncontrolled -Current medications: Marland Kitchen Metformin XR 500 mg 2 tablets daily  -Medications previously tried: NA  -Current home glucose readings . fasting glucose: 187, 135 . post prandial glucose: 191 -Denies hypoglycemic/hyperglycemic symptoms - Patient reports lack of energy, soreness all over both legs. Feels this has gotten worse since increasing metformin. Patient also reports frequent loose stools since increasing metformin, but that they have been manageable and slowly improving.  -Current meal patterns:  . breakfast: A couple of eggs + toast. Sometimes french toast, pancakes. Coffee + 2 artificial sugars  . lunch: typically skips . dinner: Shrimp. Air fried chicken + mashed potatoes. Pork + beans  . Snacks: rarely, but will have crackers  . drinks: only water.  -Current exercise: Not currently, trying to get back into training program. Legs have been a limiting factor. - Patient has lost 40 pounds in last 6 months, unsure why.  -Educated onA1c and blood sugar goals; Complications of  diabetes including kidney damage, retinal damage, and  cardiovascular disease; Benefits of weight loss; Prevention and management of hypoglycemic episodes; Carbohydrate counting and/or plate method -Counseled to check feet daily and get yearly eye exams -Recommended increasing metformin XR to 500 mg 2 tablets with breakfast, 1 tablet with supper  Atrial Fibrillation (Goal: prevent stroke and major bleeding) -Controlled -CHADSVASC: 8 -Current treatment: . Rate control: Carvedilol 12.5 mg daily  . Anticoagulation: Eliquis 5 mg twice daily   -Medications previously tried: NA -Home BP and HR readings: NA  -Counseled on increased risk of stroke due to Afib and benefits of anticoagulation for stroke prevention; importance of adherence to anticoagulant exactly as prescribed; bleeding risk associated with Eliquis and importance of self-monitoring for signs/symptoms of bleeding; avoidance of NSAIDs due to increased bleeding risk with anticoagulants; importance of regular laboratory monitoring;  - Patient reports frequent nose bleeds that last 3-4 hours. Counseled patient on nose bleed management Nose bleeds every other day. 3-4 hours  -Recommended to continue current medication Assessed patient finances. Will start patient assistance for Eliquis  Heart Failure (Goal: control symptoms and prevent exacerbations) -Controlled Type: Systolic -NYHA Class: III (marked limitation of activity) -Ejection fraction: 35-40% (Date: Sep 2021) -Current treatment:  Carvedilol 12.5 mg daily   Losartan 50 mg daily   Torsemide 20 mg twice daily  -Medications previously tried: Entresto, Amlodipine, valsartan, spironolactone,  -Current home BP/HR readings: NA -weight has been stable, ranging from 187-195  -Educated on Importance of weighing daily; if you gain more than 3 pounds in one day or 5 pounds in one week, contact provider's office Importance of blood pressure control -Recommend STOPPING Carvedilol  due to less than ideal dosing regimen and possible impact on patient's fatigue.  -Recommend STARTING metoprolol XL 50 mg daily   Gout (Goal: prevent gout flares) -Controlled -Current treatment  . Allopurinol 300 mg 2 tablets daily  -Medications previously tried: NA  -Last flare: 2 years  -Counseled on diet and exercise extensively Recommended to continue current medication  GERD (Goal: Minimize symptoms of heartburn or reflux) -History of Barrett's Esophagus  -Controlled -Current treatment  . Pantoprazole 40 mg daily  -Medications previously tried: NA  -Recommended to continue current medication  Patient Goals/Self-Care Activities . Over the next 90 days, patient will:  - check glucose daily before breakfast, document, and provide at future appointments check blood pressure 2-3 times weekly , document, and provide at future appointments weigh daily, and contact provider if weight gain of greater than 3 pounds in one day  Follow Up Plan: Telephone follow up appointment with care management team member scheduled for: 04/27/2021 at 9:00 AM      Medication Assistance: Application for Eliquis  medication assistance program. in process.  Anticipated assistance start date 02/23/2021.  See plan of care for additional detail.  Patient's preferred pharmacy is:  [id number] [address] He[address], [place] AT [facility] OF [address] [place] [address] Phone: [phone] Fax: [phone]  Uses pill box? Yes Pt endorses 100% compliance  We discussed: Current pharmacy is preferred with insurance plan and patient is satisfied with pharmacy services Patient decided to: Utilize [facility] for medication synchronization, packaging and delivery  Care Plan and Follow Up Patient Decision:  Patient agrees to Care Plan and Follow-up.  Plan: Telephone follow up appointment with care management team member scheduled for:  04/25/2021 at 9:00 AM   [place] [phone]

## 2021-01-25 ENCOUNTER — Telehealth: Payer: Self-pay

## 2021-01-25 NOTE — Chronic Care Management (AMB) (Signed)
Chronic Care Management Pharmacy Assistant   Name: Taylor [name]  MRN: [id number] DOB: 1940-12-06  Reason for Encounter: Patient Assistance Coordination  PCP : Birdie Sons, MD  01/25/2021- Patient assistance forms filled out for Eliquis with [place] patient assistance program. Called patient, he is aware I am placing forms in the mail for him to sign and fill in the highlighted and tagged areas. Patient aware he will need to provide proof of income along with OOP expenses for 2021 to 2022. Patient will bring all documents and his completed application to PCP office for Dr [name] Section to sign and pharmacist to fax. [name], CPP notified.   Allergies:   Allergies  Allergen Reactions   Amlodipine Besylate Swelling    Had a reaction when taking with colcrys    Crestor [Rosuvastatin]     Muscle cramps and pain   Rocephin [Ceftriaxone]     unknown    Medications: Outpatient Encounter Medications as of 01/25/2021  Medication Sig Note   allopurinol (ZYLOPRIM) 300 MG tablet TAKE 2 TABLETS(600 MG) BY MOUTH DAILY    apixaban (ELIQUIS) 5 MG TABS tablet Take 5 mg by mouth 2 (two) times daily. 01/23/2021: Prescribed by Hassell Done, NP   aspirin 81 MG EC tablet Take 81 mg by mouth daily.     atorvastatin (LIPITOR) 40 MG tablet TAKE 1 TABLET BY MOUTH EVERY DAY    Calcium Carbonate Antacid 600 MG chewable tablet Chew 1 tablet by mouth daily.    carvedilol (COREG) 12.5 MG tablet TAKE 1 TABLET BY MOUTH DAILY    Cyanocobalamin (B-12) 2500 MCG TABS Take 2,500 mcg by mouth daily.    GLUCOSAMINE HCL PO Take 1,500 mg by mouth daily.     glucose blood (ONETOUCH VERIO) test strip Use as instructed to check sugar daily    losartan (COZAAR) 50 MG tablet TAKE 1 TABLET BY MOUTH DAILY    magnesium oxide (MAG-OX) 400 MG tablet Take 500 mg by mouth 2 (two) times daily.    metFORMIN (GLUCOPHAGE-XR) 500 MG 24 hr tablet Take 2 tablets (1,000 mg total) by mouth daily.     pantoprazole (PROTONIX) 40 MG tablet TAKE 1 TABLET BY MOUTH EVERY DAY    potassium chloride SA (KLOR-CON) 20 MEQ tablet Take 20 mEq by mouth daily. 01/23/2021: Prescribed by Hassell Done, NP    torsemide (DEMADEX) 20 MG tablet Take 20 mg by mouth 2 (two) times daily. 01/23/2021: Prescribed by Dr. Nehemiah Massed    triamcinolone ointment (KENALOG) 0.1 % Apply 1 application topically 2 (two) times daily. As directed    No facility-administered encounter medications on file as of 01/25/2021.    Current Diagnosis: Patient Active Problem List   Diagnosis Date Noted   Diabetes mellitus without complication ([place]) 41/96/2229   Diarrhea 09/12/2020   Acute kidney injury superimposed on CKD ([place]) 08/10/2020   Chest pain 08/09/2020   Hypomagnesemia 04/26/2020   Hypocalcemia 04/19/2020   Numbness and tingling in both hands 04/19/2020   Atrial fibrillation ([place]) 04/19/2020   Other specified interstitial pulmonary diseases ([place]) 12/06/2019   Abdominal pain    Suspected COVID-19 virus infection 11/22/2019   Pancreatitis 11/22/2019   Leukocytosis 11/22/2019   CAP (community acquired pneumonia) 11/13/2019   Hyperuricemia 03/31/2019   Numbness 05/07/2018   Benign colonic polyp 03/24/2017   Tibialis anterior tenosynovitis 12/21/2015   Pleural nodule 07/27/2015   Psoriatic arthritis ([place]) 05/12/2015   Restrictive lung disease 05/12/2015   Barrett esophagus 03/30/2015  Ache in joint 03/30/2015   Bradycardia 03/30/2015   CAD (coronary artery disease) 03/30/2015   Cardiac defibrillator in place 03/30/2015   Chronic kidney disease (CKD), stage III (moderate) (HCC) 03/30/2015   Cluster headache syndrome 03/30/2015   Degeneration of lumbar or lumbosacral intervertebral disc 03/30/2015   Gout 03/30/2015   Personal history of malignant neoplasm of bladder 03/30/2015   Hypercholesteremia 03/30/2015   LBP (low back pain) 03/30/2015   Personal history of malignant melanoma of  skin 03/30/2015   Muscle ache 03/30/2015   Arthritis of knee, degenerative 03/30/2015   Prediabetes 03/30/2015   Psoriasis 03/30/2015   Obstructive sleep apnea 03/30/2015   Chronic venous insufficiency 03/30/2015   History of abdominal aortic aneurysm (AAA) 06/24/2013   History of CVA (cerebrovascular accident) 06/24/2013   Cardiomyopathy, ischemic 06/24/2013   History of AAA (abdominal aortic aneurysm) repair 06/24/2013   Essential (primary) hypertension 08/17/2011   Arrhythmia, sinus node 08/17/2011   History of ventricular fibrillation 44/69/5072   Systolic CHF with reduced left ventricular function, NYHA class 3 ([name]) 02/26/2010   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

## 2021-02-21 ENCOUNTER — Telehealth: Payer: Self-pay

## 2021-02-21 NOTE — Progress Notes (Signed)
    Chronic Care Management Pharmacy Assistant   Name: Taylor [name]  MRN: [id number] DOB: 19-Dec-1940   Reason for Encounter: Medication Review   Medications: Outpatient Encounter Medications as of 02/21/2021  Medication Sig Note  . allopurinol (ZYLOPRIM) 300 MG tablet TAKE 2 TABLETS(600 MG) BY MOUTH DAILY   . apixaban (ELIQUIS) 5 MG TABS tablet Take 5 mg by mouth 2 (two) times daily. 01/23/2021: Prescribed by Hassell Done, NP  . aspirin 81 MG EC tablet Take 81 mg by mouth daily.    Marland Kitchen atorvastatin (LIPITOR) 40 MG tablet TAKE 1 TABLET BY MOUTH EVERY DAY   . Calcium Carbonate Antacid 600 MG chewable tablet Chew 1 tablet by mouth daily.   . carvedilol (COREG) 12.5 MG tablet TAKE 1 TABLET BY MOUTH DAILY   . Cyanocobalamin (B-12) 2500 MCG TABS Take 2,500 mcg by mouth daily.   Marland Kitchen GLUCOSAMINE HCL PO Take 1,500 mg by mouth daily.    Marland Kitchen glucose blood (ONETOUCH VERIO) test strip Use as instructed to check sugar daily   . losartan (COZAAR) 50 MG tablet TAKE 1 TABLET BY MOUTH DAILY   . magnesium oxide (MAG-OX) 400 MG tablet Take 500 mg by mouth 2 (two) times daily.   . metFORMIN (GLUCOPHAGE-XR) 500 MG 24 hr tablet Take 2 tablets (1,000 mg total) by mouth daily.   . pantoprazole (PROTONIX) 40 MG tablet TAKE 1 TABLET BY MOUTH EVERY DAY   . potassium chloride SA (KLOR-CON) 20 MEQ tablet Take 20 mEq by mouth daily. 01/23/2021: Prescribed by Hassell Done, NP   . torsemide (DEMADEX) 20 MG tablet Take 20 mg by mouth 2 (two) times daily. 01/23/2021: Prescribed by Dr. Nehemiah Massed   . triamcinolone ointment (KENALOG) 0.1 % Apply 1 application topically 2 (two) times daily. As directed    No facility-administered encounter medications on file as of 02/21/2021.   Reviewed chart and adherence measures. Per insurance data patient is 90-99 % adherent to atorvastatin .  [place] Pharmacist Assistant [phone]

## 2021-02-23 ENCOUNTER — Ambulatory Visit (INDEPENDENT_AMBULATORY_CARE_PROVIDER_SITE_OTHER): Payer: Medicare Other | Admitting: Family Medicine

## 2021-02-23 ENCOUNTER — Encounter: Payer: Self-pay | Admitting: Family Medicine

## 2021-02-23 ENCOUNTER — Other Ambulatory Visit: Payer: Self-pay

## 2021-02-23 VITALS — BP 124/70 | HR 62 | Temp 98.2°F | Resp 16 | Wt 205.0 lb

## 2021-02-23 DIAGNOSIS — L409 Psoriasis, unspecified: Secondary | ICD-10-CM | POA: Diagnosis not present

## 2021-02-23 DIAGNOSIS — E1165 Type 2 diabetes mellitus with hyperglycemia: Secondary | ICD-10-CM

## 2021-02-23 DIAGNOSIS — M542 Cervicalgia: Secondary | ICD-10-CM

## 2021-02-23 DIAGNOSIS — E790 Hyperuricemia without signs of inflammatory arthritis and tophaceous disease: Secondary | ICD-10-CM | POA: Diagnosis not present

## 2021-02-23 LAB — POCT GLYCOSYLATED HEMOGLOBIN (HGB A1C)
Est. average glucose Bld gHb Est-mCnc: 157
Hemoglobin A1C: 7.1 % — AB (ref 4.0–5.6)

## 2021-02-23 MED ORDER — CYCLOBENZAPRINE HCL 5 MG PO TABS: 5.0000 mg | ORAL_TABLET | Freq: Three times a day (TID) | ORAL | 1 refills | Status: DC | PRN

## 2021-02-23 MED ORDER — TRIAMCINOLONE ACETONIDE 0.1 % EX OINT: 1.0000 "application " | TOPICAL_OINTMENT | Freq: Two times a day (BID) | CUTANEOUS | 1 refills | Status: DC

## 2021-02-23 NOTE — Progress Notes (Signed)
Established patient visit   Patient: Taylor [name]   DOB: Apr 20, 1941   80 y.o. Male  MRN: [id number] Visit Date: 02/23/2021  Today's healthcare provider: Lelon Huh, MD   Chief Complaint  Patient presents with  . Diabetes   Subjective    HPI  Diabetes Mellitus Type II, Follow-up  Lab Results  Component Value Date   HGBA1C 7.1 (A) 02/23/2021   HGBA1C 8.2 (H) 10/04/2020   HGBA1C 7.7 (A) 12/14/2019   Wt Readings from Last 3 Encounters:  02/23/21 205 lb (93 kg)  11/21/20 206 lb (93.4 kg)  10/04/20 216 lb (98 kg)   Last seen for diabetes on 11/21/2021.   Management since then includes increasing metformin XR to 2 tablets daily. He reports good compliance with treatment. He is not having side effects.  Symptoms: No fatigue No foot ulcerations  No appetite changes No nausea  No paresthesia of the feet  No polydipsia  No polyuria No visual disturbances   No vomiting     Home blood sugar records: fasting range: 135-180  Episodes of hypoglycemia? No    Current insulin regiment: none Most Recent Eye Exam: not UTD Current exercise: none Current diet habits: well balanced  Pertinent Labs: Lab Results  Component Value Date   CHOL 156 11/22/2019   HDL 43 11/22/2019   LDLCALC 77 11/22/2019   TRIG 182 (H) 11/22/2019   CHOLHDL 3.6 11/22/2019   Lab Results  Component Value Date   NA 144 11/21/2020   K 4.1 11/21/2020   CREATININE 1.31 (H) 11/21/2020   GFRNONAA 52 (L) 11/21/2020   GFRAA 60 11/21/2020   GLUCOSE 165 (H) 11/21/2020     ---------------------------------------------------------------------------------------------------  Follow up for hypomagnesemia:  The patient was last seen for this 3 months ago. Changes made at last visit include none; labs were checked showing magnesium levels were low, but stable. Patient advised to continue magnesium supplement.  He reports good compliance with treatment. He feels that condition is Unchanged. He is  not having side effects.   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      Medications: Outpatient Medications Prior to Visit  Medication Sig  . allopurinol (ZYLOPRIM) 300 MG tablet TAKE 2 TABLETS(600 MG) BY MOUTH DAILY  . apixaban (ELIQUIS) 5 MG TABS tablet Take 5 mg by mouth 2 (two) times daily.  Marland Kitchen aspirin 81 MG EC tablet Take 81 mg by mouth daily.   Marland Kitchen atorvastatin (LIPITOR) 40 MG tablet TAKE 1 TABLET BY MOUTH EVERY DAY  . Calcium Carbonate Antacid 600 MG chewable tablet Chew 1 tablet by mouth daily.  . carvedilol (COREG) 12.5 MG tablet TAKE 1 TABLET BY MOUTH DAILY  . Cyanocobalamin (B-12) 2500 MCG TABS Take 2,500 mcg by mouth daily.  Marland Kitchen GLUCOSAMINE HCL PO Take 1,500 mg by mouth daily.   Marland Kitchen glucose blood (ONETOUCH VERIO) test strip Use as instructed to check sugar daily  . losartan (COZAAR) 50 MG tablet TAKE 1 TABLET BY MOUTH DAILY  . magnesium oxide (MAG-OX) 400 MG tablet Take 500 mg by mouth 2 (two) times daily.  . metFORMIN (GLUCOPHAGE-XR) 500 MG 24 hr tablet Take 2 tablets (1,000 mg total) by mouth daily.  . pantoprazole (PROTONIX) 40 MG tablet TAKE 1 TABLET BY MOUTH EVERY DAY  . potassium chloride SA (KLOR-CON) 20  MEQ tablet Take 20 mEq by mouth daily.  Marland Kitchen torsemide (DEMADEX) 20 MG tablet Take 20 mg by mouth 2 (two) times daily.  Marland Kitchen triamcinolone ointment (KENALOG) 0.1 % Apply 1 application topically 2 (two) times daily. As directed   No facility-administered medications prior to visit.    Review of Systems  Constitutional: Negative for appetite change, chills and fever.  Respiratory: Negative for chest tightness, shortness of breath and wheezing.   Cardiovascular: Negative for chest pain and palpitations.  Gastrointestinal: Negative for abdominal pain, nausea and vomiting.        Objective    BP 124/70   Pulse 62   Temp 98.2 F (36.8 C) (Temporal)   Resp 16   Wt 205 lb (93 kg)   BMI 29.41 kg/m     Physical Exam    General: Appearance:     Well developed, well nourished male in no acute distress  Eyes:    PERRL, conjunctiva/corneas clear, EOM's intact       Lungs:     Clear to auscultation bilaterally, respirations unlabored  Heart:    Normal heart rate. Irregularly irregular rhythm. No murmurs, rubs, or gallops.   MS:   All extremities are intact. Tight left upper trapezius.   Neurologic:   Awake, alert, oriented x 3. No apparent focal neurological           defect.         Results for orders placed or performed in visit on 02/23/21  POCT HgB A1C  Result Value Ref Range   Hemoglobin A1C 7.1 (A) 4.0 - 5.6 %   Est. average glucose Bld gHb Est-mCnc 157     Assessment & Plan     1. Type 2 diabetes mellitus with hyperglycemia, without long-term current use of insulin (HCC) Much better, continue metformin. Follow up 4 months.   2. Psoriasis refill- triamcinolone ointment (KENALOG) 0.1 %; Apply 1 application topically 2 (two) times daily. As directed  Dispense: 453 g; Refill: 1  3. Hypocalcemia Secondary to loop diuretics, on calcium supplements.  - Renal function panel  4. Hypomagnesemia Secondary to loop diuretics, on magnesium supplements.  - Magnesium  5. 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  - Uric acid  6. Neck pain on left side Just started up about 3-4 days ago.  - cyclobenzaprine (FLEXERIL) 5 MG tablet; Take 1 tablet (5 mg total) by mouth 3 (three) times daily as needed for muscle spasms.  Dispense: 30 tablet; Refill: 1   No follow-ups on file.         Lelon Huh, MD  [facility] [phone] (phone) [phone] (fax)  [place]

## 2021-02-24 LAB — RENAL FUNCTION PANEL
Albumin: 4.3 g/dL (ref 3.7–4.7)
BUN/Creatinine Ratio: 19 (ref 10–24)
BUN: 29 mg/dL — ABNORMAL HIGH (ref 8–27)
CO2: 25 mmol/L (ref 20–29)
Calcium: 9.7 mg/dL (ref 8.6–10.2)
Chloride: 100 mmol/L (ref 96–106)
Creatinine, Ser: 1.52 mg/dL — ABNORMAL HIGH (ref 0.76–1.27)
Glucose: 111 mg/dL — ABNORMAL HIGH (ref 65–99)
Phosphorus: 3.6 mg/dL (ref 2.8–4.1)
Potassium: 4.5 mmol/L (ref 3.5–5.2)
Sodium: 143 mmol/L (ref 134–144)
eGFR: 47 mL/min/{1.73_m2} — ABNORMAL LOW (ref >59)

## 2021-02-24 LAB — URIC ACID: Uric Acid: 4.2 mg/dL (ref 3.8–8.4)

## 2021-02-24 LAB — MAGNESIUM: Magnesium: 1.4 mg/dL — ABNORMAL LOW (ref 1.6–2.3)

## 2021-02-27 ENCOUNTER — Telehealth: Payer: Self-pay

## 2021-02-27 DIAGNOSIS — M542 Cervicalgia: Secondary | ICD-10-CM

## 2021-02-28 MED ORDER — TIZANIDINE HCL 2 MG PO TABS: 2.0000 mg | ORAL_TABLET | Freq: Four times a day (QID) | ORAL | 1 refills | Status: AC | PRN

## 2021-03-01 DIAGNOSIS — I714 Abdominal aortic aneurysm, without rupture: Secondary | ICD-10-CM | POA: Diagnosis not present

## 2021-03-01 DIAGNOSIS — I6523 Occlusion and stenosis of bilateral carotid arteries: Secondary | ICD-10-CM | POA: Diagnosis not present

## 2021-03-01 DIAGNOSIS — I25708 Atherosclerosis of coronary artery bypass graft(s), unspecified, with other forms of angina pectoris: Secondary | ICD-10-CM | POA: Diagnosis not present

## 2021-03-01 DIAGNOSIS — R109 Unspecified abdominal pain: Secondary | ICD-10-CM | POA: Insufficient documentation

## 2021-03-01 DIAGNOSIS — I5022 Chronic systolic (congestive) heart failure: Secondary | ICD-10-CM | POA: Diagnosis not present

## 2021-03-01 DIAGNOSIS — I480 Paroxysmal atrial fibrillation: Secondary | ICD-10-CM | POA: Diagnosis not present

## 2021-03-01 NOTE — Telephone Encounter (Signed)
Error

## 2021-03-06 ENCOUNTER — Telehealth: Payer: Self-pay

## 2021-03-06 NOTE — Progress Notes (Signed)
Chronic Care Management Pharmacy Assistant   Name: Taylor [name]  MRN: [id number] DOB: 1941/05/12  Reason for Encounter:Diabetes Disease State Call.   Recent office visits:  02/23/2021 PCP Lelon Huh   Recent consult visits:  No recent consult visit  Hospital visits:  None in previous 6 months  Medications: Outpatient Encounter Medications as of 03/06/2021  Medication Sig Note  . allopurinol (ZYLOPRIM) 300 MG tablet TAKE 2 TABLETS(600 MG) BY MOUTH DAILY   . apixaban (ELIQUIS) 5 MG TABS tablet Take 5 mg by mouth 2 (two) times daily. 01/23/2021: Prescribed by Hassell Done, NP  . aspirin 81 MG EC tablet Take 81 mg by mouth daily.    Marland Kitchen atorvastatin (LIPITOR) 40 MG tablet TAKE 1 TABLET BY MOUTH EVERY DAY   . Calcium Carbonate Antacid 600 MG chewable tablet Chew 1 tablet by mouth daily.   . carvedilol (COREG) 12.5 MG tablet TAKE 1 TABLET BY MOUTH DAILY   . Cyanocobalamin (B-12) 2500 MCG TABS Take 2,500 mcg by mouth daily.   Marland Kitchen GLUCOSAMINE HCL PO Take 1,500 mg by mouth daily.    Marland Kitchen glucose blood (ONETOUCH VERIO) test strip Use as instructed to check sugar daily   . losartan (COZAAR) 50 MG tablet TAKE 1 TABLET BY MOUTH DAILY   . magnesium oxide (MAG-OX) 400 MG tablet Take 500 mg by mouth 2 (two) times daily.   . metFORMIN (GLUCOPHAGE-XR) 500 MG 24 hr tablet Take 2 tablets (1,000 mg total) by mouth daily.   . pantoprazole (PROTONIX) 40 MG tablet TAKE 1 TABLET BY MOUTH EVERY DAY   . potassium chloride SA (KLOR-CON) 20 MEQ tablet Take 20 mEq by mouth daily. 01/23/2021: Prescribed by Hassell Done, NP   . tiZANidine (ZANAFLEX) 2 MG tablet Take 1-2 tablets (2-4 mg total) by mouth every 6 (six) hours as needed for muscle spasms.   Marland Kitchen torsemide (DEMADEX) 20 MG tablet Take 20 mg by mouth 2 (two) times daily. 01/23/2021: Prescribed by Dr. Nehemiah Massed   . triamcinolone ointment (KENALOG) 0.1 % Apply 1 application topically 2 (two) times daily. As directed    No facility-administered encounter  medications on file as of 03/06/2021.   Star Rating Drugs: Atorvastatin 40 mg last filled on 12/29/2020 for 90 day supply Losartan 50 mg last filled on 02/05/2021 for 90 day supply Metformin 500 mg last filled on 12/20/2020 for 90 day supply  Recent Relevant Labs: Lab Results  Component Value Date/Time   HGBA1C 7.1 (A) 02/23/2021 01:53 PM   HGBA1C 8.2 (H) 10/04/2020 09:05 AM   HGBA1C 7.7 (A) 12/14/2019 10:31 AM   HGBA1C 6.3 (H) 05/12/2019 09:14 AM    Kidney Function Lab Results  Component Value Date/Time   CREATININE 1.52 (H) 02/23/2021 02:11 PM   CREATININE 1.31 (H) 11/21/2020 03:05 PM   GFRNONAA 52 (L) 11/21/2020 03:05 PM   GFRAA 60 11/21/2020 03:05 PM    . Current antihyperglycemic regimen:   Metformin XR 500 mg 2 tablets daily   Patient reports loose stools occasionally.Patient states his loose stools have improve over time. . What recent interventions/DTPs have been made to improve glycemic control:  o None ID  . Have there been any recent hospitalizations or ED visits since last visit with CPP? No . Patient reports hypoglycemic symptoms, including Vision changes . Patient reports hyperglycemic symptoms, including blurry vision and weakness  o Patient reports he has not set up appointment with his opthalmologist yet but will make appointment soon.  o Patient states he has  always had weakness especially when he had COVID.  Marland Kitchen How often are you checking your blood sugar? . Patient states he checks his blood sugar once in a while. . What are your blood sugars ranging?  o Patient states his blood sugar ranges from 135-180. . During the week, how often does your blood glucose drop below 70? Never . Are you checking your feet daily/regularly?   Patient states "it feels like Im walking on sponges".  Adherence Review: Is the patient currently on a STATIN medication? Yes Is the patient currently on ACE/ARB medication? Yes Does the patient have >5 day gap between last  estimated fill dates? No   Anderson Malta Clinical Production designer, theatre/television/film [phone]

## 2021-03-13 DIAGNOSIS — I5022 Chronic systolic (congestive) heart failure: Secondary | ICD-10-CM | POA: Diagnosis not present

## 2021-03-17 ENCOUNTER — Other Ambulatory Visit: Payer: Self-pay | Admitting: Family Medicine

## 2021-03-17 DIAGNOSIS — M1000 Idiopathic gout, unspecified site: Secondary | ICD-10-CM

## 2021-03-17 NOTE — Telephone Encounter (Signed)
Requested Prescriptions  Pending Prescriptions Disp Refills  . allopurinol (ZYLOPRIM) 300 MG tablet [Pharmacy Med Name: ALLOPURINOL 300MG  TABLETS] 180 tablet 0    Sig: TAKE 2 TABLETS(600 MG) BY MOUTH DAILY     Endocrinology:  Gout Agents Failed - 03/17/2021  7:04 AM      Failed - Cr in normal range and within 360 days    Creatinine, Ser  Date Value Ref Range Status  02/23/2021 1.52 (H) 0.76 - 1.27 mg/dL Final   Creatinine, Urine  Date Value Ref Range Status  08/10/2020 121 mg/dL Final    Comment:    Performed at [facility], [address] [place],  [id number]         Passed - Uric Acid in normal range and within 360 days    Uric Acid  Date Value Ref Range Status  02/23/2021 4.2 3.8 - 8.4 mg/dL Final    Comment:               Therapeutic target for gout patients: <6.0         Passed - Valid encounter within last 12 months    Recent Outpatient Visits          3 weeks ago Type 2 diabetes mellitus with hyperglycemia, without long-term current use of insulin [facility])   [facility] Birdie Sons, MD   3 months ago Diabetes mellitus without complication [place])   [facility] Birdie Sons, MD   5 months ago Essential (primary) hypertension   [facility] Birdie Sons, MD   6 months ago Upper respiratory tract infection, unspecified type   [facility] Flinchum, Kelby Aline, FNP   8 months ago Essential (primary) hypertension   [place], [name], MD      Future Appointments            In 3 months [name], [name], MD [facility], [place]

## 2021-04-03 ENCOUNTER — Telehealth: Payer: Self-pay

## 2021-04-03 NOTE — Progress Notes (Signed)
Chronic Care Management Pharmacy Assistant   Name: Taylor [name]  MRN: [id number] DOB: [birth year]  Reason for Encounter:Diabetes Disease State    Recent office visits:  No recent Office Visit  Recent consult visits:  No recent [facility] visits:  None in previous 6 months  Medications: Outpatient Encounter Medications as of 04/03/2021  Medication Sig Note  . allopurinol (ZYLOPRIM) 300 MG tablet TAKE 2 TABLETS(600 MG) BY MOUTH DAILY   . apixaban (ELIQUIS) 5 MG TABS tablet Take 5 mg by mouth 2 (two) times daily. 01/23/2021: Prescribed by Hassell Done, NP  . aspirin 81 MG EC tablet Take 81 mg by mouth daily.    Marland Kitchen atorvastatin (LIPITOR) 40 MG tablet TAKE 1 TABLET BY MOUTH EVERY DAY   . Calcium Carbonate Antacid 600 MG chewable tablet Chew 1 tablet by mouth daily.   . carvedilol (COREG) 12.5 MG tablet TAKE 1 TABLET BY MOUTH DAILY   . Cyanocobalamin (B-12) 2500 MCG TABS Take 2,500 mcg by mouth daily.   Marland Kitchen GLUCOSAMINE HCL PO Take 1,500 mg by mouth daily.    Marland Kitchen glucose blood (ONETOUCH VERIO) test strip Use as instructed to check sugar daily   . losartan (COZAAR) 50 MG tablet TAKE 1 TABLET BY MOUTH DAILY   . magnesium oxide (MAG-OX) 400 MG tablet Take 500 mg by mouth 2 (two) times daily.   . metFORMIN (GLUCOPHAGE-XR) 500 MG 24 hr tablet Take 2 tablets (1,000 mg total) by mouth daily.   . pantoprazole (PROTONIX) 40 MG tablet TAKE 1 TABLET BY MOUTH EVERY DAY   . potassium chloride SA (KLOR-CON) 20 MEQ tablet Take 20 mEq by mouth daily. 01/23/2021: Prescribed by Hassell Done, NP   . tiZANidine (ZANAFLEX) 2 MG tablet Take 1-2 tablets (2-4 mg total) by mouth every 6 (six) hours as needed for muscle spasms.   Marland Kitchen torsemide (DEMADEX) 20 MG tablet Take 20 mg by mouth 2 (two) times daily. 01/23/2021: Prescribed by Dr. Nehemiah Massed   . triamcinolone ointment (KENALOG) 0.1 % Apply 1 application topically 2 (two) times daily. As directed    No facility-administered encounter medications on file as of  04/03/2021.   Star Rating Drugs: Atorvastatin 40 mg last filled on 03/21/2021 for 90 day supply Losartan 50 mg last filled on 02/05/2021 for 90 day supply Metformin 500 mg last filled on 03/21/2021 for 90 day supply  Recent Relevant Labs: Lab Results  Component Value Date/Time   HGBA1C 7.1 (A) 02/23/2021 01:53 PM   HGBA1C 8.2 (H) 10/04/2020 09:05 AM   HGBA1C 7.7 (A) 12/14/2019 10:31 AM   HGBA1C 6.3 (H) 05/12/2019 09:14 AM    Kidney Function Lab Results  Component Value Date/Time   CREATININE 1.52 (H) 02/23/2021 02:11 PM   CREATININE 1.31 (H) 11/21/2020 03:05 PM   GFRNONAA 52 (L) 11/21/2020 03:05 PM   GFRAA 60 11/21/2020 03:05 PM    . Current antihyperglycemic regimen:   Metformin XR 500 mg 2 tablets daily  . What recent interventions/DTPs have been made to improve glycemic control:  o None ID . Have there been any recent hospitalizations or ED visits since last visit with CPP? No . Patient denies hypoglycemic symptoms, including Pale, Sweaty, Shaky, Hungry, Nervous/irritable and Vision changes . Patient denies hyperglycemic symptoms, including blurry vision, excessive thirst, fatigue, polyuria and weakness . How often are you checking your blood sugar?  o Patient states he checks his blood sugar once in a while. . What are your blood sugars ranging?  o Fasting:  n/a o Before meals: n/a o After meals:  - Patient states his blood sugar was 135 the last time he check. o Bedtime: n/a . During the week, how often does your blood glucose drop below 70? Never . Are you checking your feet daily/regularly?   Patient denies numbness, pain or tingling sensation in his feet.  Adherence Review: Is the patient currently on a STATIN medication? Yes Is the patient currently on ACE/ARB medication? Yes Does the patient have >5 day gap between last estimated fill dates? No   Bessie [place] Production designer, theatre/television/film [phone]   .

## 2021-04-16 ENCOUNTER — Telehealth: Payer: Self-pay

## 2021-04-16 NOTE — Telephone Encounter (Signed)
Copied from [place] [id number]. Topic: Appointment Scheduling - Scheduling Inquiry for Clinic >> Apr 16, 2021  9:55 AM L[place] wrote: Reason for CRM: Pt is requesting an appt with dr [name] Section for a sore that will not heal. Pt has a cough and cold symptoms.Pt is adamant about in office appt and wants a FU call to see if Dr F will see him FU at [phone]

## 2021-04-17 NOTE — Telephone Encounter (Signed)
I called and spoke with patient. He has a knot on his upper thigh x 3 weeks and productive cough. Virtual video visit scheduled for tomorrow 04/18/2021 at 10am.

## 2021-04-18 ENCOUNTER — Other Ambulatory Visit: Payer: Self-pay

## 2021-04-18 ENCOUNTER — Telehealth: Payer: Self-pay | Admitting: Family Medicine

## 2021-04-18 ENCOUNTER — Telehealth (INDEPENDENT_AMBULATORY_CARE_PROVIDER_SITE_OTHER): Payer: Medicare Other | Admitting: Family Medicine

## 2021-04-18 DIAGNOSIS — J069 Acute upper respiratory infection, unspecified: Secondary | ICD-10-CM | POA: Diagnosis not present

## 2021-04-18 DIAGNOSIS — L02416 Cutaneous abscess of left lower limb: Secondary | ICD-10-CM | POA: Diagnosis not present

## 2021-04-18 MED ORDER — DOXYCYCLINE HYCLATE 100 MG PO TABS: 100.0000 mg | ORAL_TABLET | Freq: Two times a day (BID) | ORAL | 0 refills | Status: AC

## 2021-04-18 NOTE — Telephone Encounter (Signed)
Pt given recommendations per Dr [name] Section, "Patient had virtual visit this morning with URI symptoms. I thought patient just had Covid a few months ago, but it's actually been a year. Can you contact patient and recommend he do at home covid test, since there are now effective antivirals for Covid that he could take if it is positive. Thanks."; the pt verbalized understanding; will route to office for notification of encounter.Marland Kitchen

## 2021-04-18 NOTE — Progress Notes (Signed)
MyChart Video Visit    Virtual Visit via Video Note   This visit type was conducted due to national recommendations for restrictions regarding the COVID-19 Pandemic (e.g. social distancing) in an effort to limit this patient's exposure and mitigate transmission in our community. This patient is at least at moderate risk for complications without adequate follow up. This format is felt to be most appropriate for this patient at this time. Physical exam was limited by quality of the video and audio technology used for the visit.   Patient location: home Provider location: [facility]  I discussed the limitations of evaluation and management by telemedicine and the availability of in person appointments. The patient expressed understanding and agreed to proceed.  Patient: Taylor [name]   DOB: 1941-08-16   80 y.o. Male  MRN: [id number] Visit Date: 04/18/2021  Today's healthcare provider: Lelon Huh, MD   Chief Complaint  Patient presents with  . Wound Check  . Cough   Subjective    Cough This is a new problem. Episode onset: 2-3 days ago. The problem has been gradually improving. The cough is non-productive. Pertinent negatives include no chest pain, chills, ear congestion, ear pain, fever, nasal congestion, postnasal drip, sore throat, shortness of breath or wheezing. Treatments tried: NyQuil.   States he was working in [employer] over the weekend and came hold with cold sx. He    Skin lesion: Patient complains of a painful knot on the upper left thigh. He first noticed the knot 3 weeks ago. He tried draining the knot by squeezing it, but was unsuccessful. He states the knot is raised,  has some purple bruising around it and is about the size of a dime.      Medications: Outpatient Medications Prior to Visit  Medication Sig  . Cyanocobalamin (B-12) 2500 MCG TABS Take 2,500 mcg by mouth daily.  . magnesium oxide (MAG-OX) 400 MG tablet Take 500 mg by mouth 2 (two) times daily.   Marland Kitchen allopurinol (ZYLOPRIM) 300 MG tablet TAKE 2 TABLETS(600 MG) BY MOUTH DAILY  . apixaban (ELIQUIS) 5 MG TABS tablet Take 5 mg by mouth 2 (two) times daily.  Marland Kitchen aspirin 81 MG EC tablet Take 81 mg by mouth daily.   Marland Kitchen atorvastatin (LIPITOR) 40 MG tablet TAKE 1 TABLET BY MOUTH EVERY DAY  . Calcium Carbonate Antacid 600 MG chewable tablet Chew 1 tablet by mouth daily.  . carvedilol (COREG) 12.5 MG tablet TAKE 1 TABLET BY MOUTH DAILY  . GLUCOSAMINE HCL PO Take 1,500 mg by mouth daily.   Marland Kitchen glucose blood (ONETOUCH VERIO) test strip Use as instructed to check sugar daily  . losartan (COZAAR) 50 MG tablet TAKE 1 TABLET BY MOUTH DAILY  . metFORMIN (GLUCOPHAGE-XR) 500 MG 24 hr tablet Take 2 tablets (1,000 mg total) by mouth daily.  . pantoprazole (PROTONIX) 40 MG tablet TAKE 1 TABLET BY MOUTH EVERY DAY  . potassium chloride SA (KLOR-CON) 20 MEQ tablet Take 20 mEq by mouth daily.  Marland Kitchen tiZANidine (ZANAFLEX) 2 MG tablet Take 1-2 tablets (2-4 mg total) by mouth every 6 (six) hours as needed for muscle spasms.  Marland Kitchen torsemide (DEMADEX) 20 MG tablet Take 20 mg by mouth 2 (two) times daily.  Marland Kitchen triamcinolone ointment (KENALOG) 0.1 % Apply 1 application topically 2 (two) times daily. As directed   No facility-administered medications prior to visit.    Review of Systems  Constitutional: Negative for appetite change, chills and fever.  HENT: Negative for ear pain,  postnasal drip and sore throat.   Respiratory: Positive for cough. Negative for chest tightness, shortness of breath and wheezing.   Cardiovascular: Negative for chest pain and palpitations.  Gastrointestinal: Negative for abdominal pain, nausea and vomiting.  Hematological: Bruises/bleeds easily.      Objective    There were no vitals taken for this visit.   Physical Exam   Awake, alert, oriented x 3. In no apparent distress   Assessment & Plan     1. Abscess of left leg  - doxycycline (VIBRA-TABS) 100 MG tablet; Take 1 tablet (100 mg  total) by mouth 2 (two) times daily for 14 days.  Dispense: 28 tablet; Refill: 0  2. Viral upper respiratory tract infection Mild symptoms for now, not patient's primary concern. But recommended he take at home covid test due to history of serious covid infection last year.         Video connection was lost when less than 50% of the duration of the visit was complete, at which time the remainder of the visit was completed via audio only.  I discussed the assessment and treatment plan with the patient. The patient was provided an opportunity to ask questions and all were answered. The patient agreed with the plan and demonstrated an understanding of the instructions.   The patient was advised to call back or seek an in-person evaluation if the symptoms worsen or if the condition fails to improve as anticipated.  I provided 9 minutes of non-face-to-face time during this encounter.  The entirety of the information documented in the History of Present Illness, Review of Systems and Physical Exam were personally obtained by me. Portions of this information were initially documented by the CMA and reviewed by me for thoroughness and accuracy.     Lelon Huh, MD [facility] [phone] (phone) [phone] (fax)  [place]

## 2021-04-18 NOTE — Telephone Encounter (Signed)
Patient had virtual visit this morning with URI symptoms. I thought patient just had Covid a few months ago, but it's actually been a year. Can you contact patient and recommend he do at home covid test, since there are now effective antivirals for Covid that he could take if it is positive. Thanks.

## 2021-04-18 NOTE — Telephone Encounter (Signed)
Tried calling patient. No answer or voice message system. Will try calling back at a later time. OK for [facility] triage to advise if patient returns call.

## 2021-04-26 ENCOUNTER — Telehealth: Payer: Self-pay

## 2021-04-26 NOTE — Progress Notes (Signed)
Spoke to patient to confirmed patient telephone appointment on  04/27/2021 for CCM at 9:00 [place] the Clinical pharmacist.    Patient Verbalized understanding.   Questions: Have you had any recent office visit or specialist visit outside of [place]? No Are there any concerns you would like to discuss during your office visit? No Are you having any problems obtaining your medications?  No  Anderson Malta Clinical Production designer, theatre/television/film [phone]

## 2021-04-27 ENCOUNTER — Ambulatory Visit (INDEPENDENT_AMBULATORY_CARE_PROVIDER_SITE_OTHER): Payer: Medicare Other

## 2021-04-27 DIAGNOSIS — I480 Paroxysmal atrial fibrillation: Secondary | ICD-10-CM | POA: Diagnosis not present

## 2021-04-27 DIAGNOSIS — N1831 Chronic kidney disease, stage 3a: Secondary | ICD-10-CM | POA: Diagnosis not present

## 2021-04-27 DIAGNOSIS — E11622 Type 2 diabetes mellitus with other skin ulcer: Secondary | ICD-10-CM | POA: Diagnosis not present

## 2021-04-27 NOTE — Progress Notes (Signed)
 Chronic Care Management Pharmacy Note  04/27/2021 Name:  Taylor [name] MRN:  [id number] DOB:  09/03/1941  Summary: Patient is concerned about his leg wound, which he feels has been getting worse. Patient has PCP follow-up on 05/07/2021.   Recommendations/Changes made from today's visit: Continue current medications in setting of infected leg wound.   Plan: Re-address potential medication changes for diabetes in one month  Subjective: Taylor [name] is an 80 y.o. year old male who is a primary patient of [name], Donald E, MD.  The CCM team was consulted for assistance with disease management and care coordination needs.    Engaged with patient by telephone for follow up visit in response to provider referral for pharmacy case management and/or care coordination services.   Consent to Services:  The patient was given information about Chronic Care Management services, agreed to services, and gave verbal consent prior to initiation of services.  Please see initial visit note for detailed documentation.   Patient Care Team: [name], Donald E, MD as PCP - General (Family Medicine) Kowalski, Bruce J, MD as Consulting Physician (Cardiology) Porfilio, William, MD as Referring Physician (Ophthalmology) Aleskerov, Fuad, MD as Consulting Physician (Pulmonary Disease) Fleury, Alexandre A, RPH (Pharmacist)  Recent office visits: 04/18/21: Video visit with Dr. [name] for leg abscess. Doxycycline 100 mg BID started.  02/23/21: Patient presented to Dr. [name] for follow-up. A1c improved to 7.1%. eGFr 47. Mag 1.4. Tizanidine started.  11/21/20: Patient presented to Dr. [name] for follow-up. Breo Elipta and furosemide stopped.   Recent consult visits: 03/01/21: Patient presented to Dr. Kowalski (Cardiology) for follow-up. Carvedilol 6.25 mg twice daily  11/30/20: Patient presented to Nathan Allen, NP (Cardiology) for follow-up. Eliquis 5 mg twice daily started for anticoagulation.   Hospital visits: None  in previous 6 months   Objective:  Lab Results  Component Value Date   CREATININE 1.52 (H) 02/23/2021   BUN 29 (H) 02/23/2021   GFRNONAA 52 (L) 11/21/2020   GFRAA 60 11/21/2020   NA 143 02/23/2021   K 4.5 02/23/2021   CALCIUM 9.7 02/23/2021   CO2 25 02/23/2021   GLUCOSE 111 (H) 02/23/2021    Lab Results  Component Value Date/Time   HGBA1C 7.1 (A) 02/23/2021 01:53 PM   HGBA1C 8.2 (H) 10/04/2020 09:05 AM   HGBA1C 7.7 (A) 12/14/2019 10:31 AM   HGBA1C 6.3 (H) 05/12/2019 09:14 AM    Last diabetic Eye exam: No results found for: HMDIABEYEEXA  Last diabetic Foot exam: No results found for: HMDIABFOOTEX   Lab Results  Component Value Date   CHOL 156 11/22/2019   HDL 43 11/22/2019   LDLCALC 77 11/22/2019   TRIG 182 (H) 11/22/2019   CHOLHDL 3.6 11/22/2019    Hepatic Function Latest Ref Rng & Units 02/23/2021 11/21/2020 10/04/2020  Total Protein 6.5 - 8.1 g/dL - - -  Albumin 3.7 - 4.7 g/dL 4.3 3.8 4.0  AST 15 - 41 U/L - - -  ALT 0 - 44 U/L - - -  Alk Phosphatase 38 - 126 U/L - - -  Total Bilirubin 0.3 - 1.2 mg/dL - - -  Bilirubin, Direct 0.0 - 0.2 mg/dL - - -    Lab Results  Component Value Date/Time   TSH 1.331 11/23/2019 04:58 AM   TSH 3.430 05/15/2015 09:08 AM    CBC Latest Ref Rng & Units 08/11/2020 08/10/2020 08/09/2020  WBC 4.0 - 10.5 K/uL 9.4 11.5(H) 11.8(H)  Hemoglobin 13.0 - 17.0 g/dL 13.3 13.8 13.3    Hematocrit 39.0 - 52.0 % 41.6 42.5 42.3  Platelets 150 - 400 K/uL 105(L) 122(L) 129(L)    Lab Results  Component Value Date/Time   VD25OH 46.58 04/19/2020 04:16 PM   VD25OH 47.4 05/15/2015 09:08 AM    Clinical ASCVD: Yes  The ASCVD Risk score [name], et al., 2013) failed to calculate for the following reasons:   The patient has a prior MI or stroke diagnosis    Depression screen [facility] 2/9 06/29/2020 05/11/2019 04/07/2019  Decreased Interest 0 0 0  Down, Depressed, Hopeless 0 0 0  PHQ - 2 Score 0 0 0  Altered sleeping - 0 0  Tired, decreased energy - 0 0   Change in appetite - 0 0  Feeling bad or failure about yourself  - 0 0  Trouble concentrating - 0 0  Moving slowly or fidgety/restless - 0 0  Suicidal thoughts - 0 0  PHQ-9 Score - 0 0  Difficult doing work/chores - Not difficult at all Not difficult at all    Social History   Tobacco Use  Smoking Status Former Smoker  . Packs/day: 1.00  . Types: Cigarettes  . Quit date: 11/26/1999  . Years since quitting: 21.4  Smokeless Tobacco Never Used   BP Readings from Last 3 Encounters:  02/23/21 124/70  11/21/20 127/69  10/04/20 (!) 154/89   Pulse Readings from Last 3 Encounters:  02/23/21 62  11/21/20 64  10/04/20 71   Wt Readings from Last 3 Encounters:  02/23/21 205 lb (93 kg)  11/21/20 206 lb (93.4 kg)  10/04/20 216 lb (98 kg)   BMI Readings from Last 3 Encounters:  02/23/21 29.41 kg/m  11/21/20 29.56 kg/m  10/04/20 30.99 kg/m    Assessment/Interventions: Review of patient past medical history, allergies, medications, health status, including review of consultants reports, laboratory and other test data, was performed as part of comprehensive evaluation and provision of chronic care management services.   SDOH:  (Social Determinants of Health) assessments and interventions performed: Yes  SDOH Screenings   Alcohol Screen: Low Risk   . Last Alcohol Screening Score (AUDIT): 0  Depression (PHQ2-9): Low Risk   . PHQ-2 Score: 0  Financial Resource Strain: Low Risk   . Difficulty of Paying Living Expenses: Not hard at all  Food Insecurity: No Food Insecurity  . Worried About Charity fundraiser in the Last Year: Never true  . Ran Out of Food in the Last Year: Never true  Housing: Low Risk   . Last Housing Risk Score: 0  Physical Activity: Insufficiently Active  . Days of Exercise per Week: 1 day  . Minutes of Exercise per Session: 120 min  Social Connections: Socially Integrated  . Frequency of Communication with Friends and Family: Twice a week  . Frequency of  Social Gatherings with Friends and Family: Twice a week  . Attends Religious Services: More than 4 times per year  . Active Member of Clubs or Organizations: Yes  . Attends Archivist Meetings: More than 4 times per year  . Marital Status: Married  Stress: No Stress Concern Present  . Feeling of Stress : Not at all  Tobacco Use: Medium Risk  . Smoking Tobacco Use: Former Smoker  . Smokeless Tobacco Use: Never Used  Transportation Needs: No Transportation Needs  . Lack of Transportation (Medical): No  . Lack of Transportation (Non-Medical): No    CCM Care Plan  Allergies  Allergen Reactions  . Amlodipine Besylate Swelling  Had a reaction when taking with colcrys   . Crestor [Rosuvastatin]     Muscle cramps and pain  . Rocephin [Ceftriaxone]     unknown    Medications Reviewed Today    Reviewed by Chambers, Roshena L, CMA (Certified Medical Assistant) on 02/23/21 at 1347  Med List Status: <None>  Medication Order Taking? Sig Documenting Provider Last Dose Status Informant  allopurinol (ZYLOPRIM) 300 MG tablet [id number] Yes TAKE 2 TABLETS(600 MG) BY MOUTH DAILY [name], Donald E, MD Taking Active   apixaban (ELIQUIS) 5 MG TABS tablet [id number] Yes Take 5 mg by mouth 2 (two) times daily. [provider] Taking Active            Med Note (FLEURY, ALEXANDRE A   Tue Jan 23, 2021  2:10 PM) Prescribed by Nathan Allen, NP  aspirin 81 MG EC tablet [id number] Yes Take 81 mg by mouth daily.  [provider] Taking Active Self           Med Note (FLEURY, ALEXANDRE A   Wed Jan 24, 2021  8:17 AM)    atorvastatin (LIPITOR) 40 MG tablet [id number] Yes TAKE 1 TABLET BY MOUTH EVERY DAY [name], Donald E, MD Taking Active   Calcium Carbonate Antacid 600 MG chewable tablet [id number] Yes Chew 1 tablet by mouth daily. [provider] Taking Active Self  carvedilol (COREG) 12.5 MG tablet [id number] Yes TAKE 1 TABLET BY MOUTH DAILY [name], Donald E, MD Taking Active    Cyanocobalamin (B-12) 2500 MCG TABS [id number] Yes Take 2,500 mcg by mouth daily. [provider] Taking Active   GLUCOSAMINE HCL PO [id number] Yes Take 1,500 mg by mouth daily.  [provider] Taking Active Self  glucose blood (ONETOUCH VERIO) test strip [id number] Yes Use as instructed to check sugar daily [name], Donald E, MD Taking Active Self  losartan (COZAAR) 50 MG tablet [id number] Yes TAKE 1 TABLET BY MOUTH DAILY [name], Donald E, MD Taking Active Self  magnesium oxide (MAG-OX) 400 MG tablet [id number] Yes Take 500 mg by mouth 2 (two) times daily. [provider] Taking Active Self  metFORMIN (GLUCOPHAGE-XR) 500 MG 24 hr tablet [id number] Yes Take 2 tablets (1,000 mg total) by mouth daily. [name], Donald E, MD Taking Active   pantoprazole (PROTONIX) 40 MG tablet [id number] Yes TAKE 1 TABLET BY MOUTH EVERY DAY [name], Donald E, MD Taking Active Self  potassium chloride SA (KLOR-CON) 20 MEQ tablet [id number] Yes Take 20 mEq by mouth daily. [provider] Taking Active            Med Note (FLEURY, ALEXANDRE A   Tue Jan 23, 2021  2:10 PM) Prescribed by Nathan Allen, NP   torsemide (DEMADEX) 20 MG tablet [id number] Yes Take 20 mg by mouth 2 (two) times daily. [provider] Taking Active            Med Note (FLEURY, ALEXANDRE A   Tue Jan 23, 2021  2:10 PM) Prescribed by Dr. Kowalski   triamcinolone ointment (KENALOG) 0.1 % [id number] Yes Apply 1 application topically 2 (two) times daily. As directed [provider] Taking Active Self          Patient Active Problem List   Diagnosis Date Noted  . Diabetes mellitus without complication (HCC) 11/21/2020  . Diarrhea 09/12/2020  . Acute kidney injury superimposed on CKD (HCC) 08/10/2020  . Hypomagnesemia 04/26/2020  . Hypocalcemia 04/19/2020  . Numbness and tingling in both hands 04/19/2020  . Atrial fibrillation (HCC)   04/19/2020  . Other specified interstitial pulmonary diseases (HCC)  12/06/2019  . Abdominal pain   . Suspected COVID-19 virus infection 11/22/2019  . Pancreatitis 11/22/2019  . CAP (community acquired pneumonia) 11/13/2019  . Hyperuricemia 03/31/2019  . Numbness 05/07/2018  . Benign colonic polyp 03/24/2017  . Tibialis anterior tenosynovitis 12/21/2015  . Pleural nodule 07/27/2015  . Psoriatic arthritis (HCC) 05/12/2015  . Restrictive lung disease 05/12/2015  . Barrett esophagus 03/30/2015  . Ache in joint 03/30/2015  . Bradycardia 03/30/2015  . CAD (coronary artery disease) 03/30/2015  . Cardiac defibrillator in place 03/30/2015  . Chronic kidney disease (CKD), stage III (moderate) (HCC) 03/30/2015  . Cluster headache syndrome 03/30/2015  . Degeneration of lumbar or lumbosacral intervertebral disc 03/30/2015  . Gout 03/30/2015  . Personal history of malignant neoplasm of bladder 03/30/2015  . Hypercholesteremia 03/30/2015  . LBP (low back pain) 03/30/2015  . Personal history of malignant melanoma of skin 03/30/2015  . Muscle ache 03/30/2015  . Arthritis of knee, degenerative 03/30/2015  . Prediabetes 03/30/2015  . Psoriasis 03/30/2015  . Obstructive sleep apnea 03/30/2015  . Chronic venous insufficiency 03/30/2015  . History of abdominal aortic aneurysm (AAA) 06/24/2013  . History of CVA (cerebrovascular accident) 06/24/2013  . Cardiomyopathy, ischemic 06/24/2013  . History of AAA (abdominal aortic aneurysm) repair 06/24/2013  . Essential (primary) hypertension 08/17/2011  . Arrhythmia, sinus node 08/17/2011  . History of ventricular fibrillation 08/17/2011  . Systolic CHF with reduced left ventricular function, NYHA class 3 (HCC) 02/26/2010  . 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 Cardiac pacemaker in situ 10/10/2006    Immunization History  Administered Date(s) Administered  . Fluad Quad(high Dose 65+) 10/04/2020  . Influenza Split 09/18/2011  . Influenza, High Dose Seasonal PF 08/16/2014, 10/09/2016,  10/09/2017, 08/26/2018  . Influenza,inj,Quad PF,6+ Mos 12/20/2015  . PFIZER(Purple Top)SARS-COV-2 Vaccination 02/24/2020, 03/21/2020  . Pneumococcal Conjugate-13 05/09/2014  . Pneumococcal Polysaccharide-23 05/12/2015    Conditions to be addressed/monitored:  Hypertension, Hyperlipidemia, Diabetes, Atrial Fibrillation, Heart Failure, Coronary Artery Disease, Chronic Kidney Disease and Gout  There are no care plans that you recently modified to display for this patient.    Medication Assistance: Application for Eliquis  medication assistance program. in process.  Anticipated assistance start date TBD.  See plan of care for additional detail.  Compliance/Adherence/Medication fill history: Care Gaps: Ophthalmology Exam  Shingrix, Covid, Pneumonia Vaccines   Star-Rating Drugs: Atorvastatin 40 mg: LF 03/21/21 for 90-DS Losartan 50 mg: LF 02/05/21 for 90-DS Metformin XR: LF 03/21/21 for 90-DS  Patient's preferred pharmacy is:  [id number] - [place], [place] - [address] [place] [address] Phone: [phone] Fax: [phone]  Uses pill box? Yes Pt endorses 100% compliance  We discussed: Current pharmacy is preferred with insurance plan and patient is satisfied with pharmacy services Patient decided to: Continue current medication management strategy  Care Plan and Follow Up Patient Decision:  Patient agrees to Care Plan and Follow-up.  Plan: Telephone follow up appointment with care management team member scheduled for:  06/01/2021 at 9:00 AM  Alex Fleury, PharmD, CPP Clinical Pharmacist [facility] [phone]     

## 2021-05-02 NOTE — Patient Instructions (Signed)
Visit Information It was great speaking with you today!  Please let me know if you have any questions about our visit.  Goals Addressed            This Visit's Progress   . DIET - REDUCE PORTION SIZE   Not on track    Recommend decreasing portion sizes by half and eating 3 small meals a day with two healthy snacks in between.     . Monitor and Manage My Blood Sugar-Diabetes Type 2   Not on track    Timeframe:  Long-Range Goal Priority:  High Start Date:  01/23/2021                           Expected End Date: 07/26/2021                  Follow Up Date 06/01/2021    -Check blood sugar once daily, before breakfast  - check blood sugar if I feel it is too high or too low - enter blood sugar readings and medication or insulin into daily log    Why is this important?    Checking your blood sugar at home helps to keep it from getting very high or very low.   Writing the results in a diary or log helps the doctor know how to care for you.   Your blood sugar log should have the time, date and the results.   Also, write down the amount of insulin or other medicine that you take.   Other information, like what you ate, exercise done and how you were feeling, will also be helpful.     Notes:        Patient Care Plan: General Pharmacy (Adult)    Problem Identified: Hypertension, Hyperlipidemia, Diabetes, Atrial Fibrillation, Heart Failure, Coronary Artery Disease, Chronic Kidney Disease and Gout   Priority: High    Long-Range Goal: Patient-Specific Goal   Start Date: 01/23/2021  Expected End Date: 07/26/2021  This Visit's Progress: Not on track  Recent Progress: On track  Priority: High  Note:   Current Barriers:  . Unable to independently afford treatment regimen . Unable to achieve control of diabetes   Pharmacist Clinical Goal(s):  Marland Kitchen Over the next 90 days, patient will verbalize ability to afford treatment regimen . achieve control of diabetes as evidenced by A1c less than 8%  through collaboration with PharmD and provider.   Interventions: . 1:1 collaboration with Birdie Sons, MD regarding development and update of comprehensive plan of care as evidenced by provider attestation and co-signature . Inter-disciplinary care team collaboration (see longitudinal plan of care) . Comprehensive medication review performed; medication list updated in electronic medical record  Hyperlipidemia: (LDL goal < 70) -History of CAD, history of CVA  -Not ideally controlled -Current treatment: . Atorvastatin 40 mg daily  -Medications previously tried: NA  -Educated on Benefits of statin for ASCVD risk reduction; Importance of limiting foods high in cholesterol; -Recommended to continue current medication  Diabetes (A1c goal <8%) -Diagnosed Jan 2021 -Uncontrolled -Current medications: Marland Kitchen Metformin XR 500 mg 2 tablets daily  -Medications previously tried: NA  -Current home glucose readings . fasting glucose: 187, 135 . post prandial glucose: 191 -Denies hypoglycemic/hyperglycemic symptoms  -Current meal patterns:  . breakfast: A couple of eggs + toast. Sometimes french toast, pancakes. Coffee + 2 artificial sugars  . lunch: typically skips . dinner: Shrimp. Air fried chicken + mashed potatoes. Pork +  beans  . Snacks: rarely, but will have crackers  . drinks: only water.  -Patient with leg wound that is not improving and possibly worsening. He continues to use antibiotics and frequent dressing changes as recommended. Patient has follow-up with PCP on 05/07/21 to discuss issue.  -Recommended increasing metformin XR to 500 mg 2 tablets with breakfast, 1 tablet with supper. Will defer until next visit to address medication changes given recent wound concerns.   Atrial Fibrillation (Goal: prevent stroke and major bleeding) -Controlled -CHADSVASC: 8 -Current treatment: . Rate control: Carvedilol 12.5 mg daily  . Anticoagulation: Eliquis 5 mg twice daily   -Medications  previously tried: NA -Home BP and HR readings: NA  -Patient continues to have nose bleeds sporadically, but less frequently.  -Recommended to continue current medication  Heart Failure (Goal: control symptoms and prevent exacerbations) -Controlled Type: Systolic -NYHA Class: III (marked limitation of activity) -Ejection fraction: 35-40% (Date: Sep 2021) -Current treatment:  Carvedilol 12.5 mg 1/2 tablet twice daily    Losartan 50 mg daily   Torsemide 20 mg twice daily  -Medications previously tried: Entresto, Amlodipine, valsartan, spironolactone,  -Current home BP/HR readings: NA -weight has been stable, ranging from 187-195  -Educated on Importance of weighing daily; if you gain more than 3 pounds in one day or 5 pounds in one week, contact provider's office Importance of blood pressure control 100 yard exercise tolerance   Gout (Goal: prevent gout flares) -Controlled -Current treatment  . Allopurinol 300 mg 2 tablets daily  -Medications previously tried: NA  -Last flare: 2 years  -Counseled on diet and exercise extensively Recommended to continue current medication  GERD (Goal: Minimize symptoms of heartburn or reflux) -History of Barrett's Esophagus  -Controlled -Current treatment  . Pantoprazole 40 mg daily  -Medications previously tried: NA  -Recommended to continue current medication  Chronic Kidney Disease Stage 3a  -All medications assessed for renal dosing and appropriateness in chronic kidney disease. -Recommended to continue current medication  Patient Goals/Self-Care Activities . Over the next 90 days, patient will:  - check glucose daily before breakfast, document, and provide at future appointments check blood pressure 2-3 times weekly , document, and provide at future appointments weigh daily, and contact provider if weight gain of greater than 3 pounds in one day  Follow Up Plan: Telephone follow up appointment with care management team member  scheduled for:  06/01/2021 at 9:00 AM    Patient agreed to services and verbal consent obtained.   The patient verbalized understanding of instructions, educational materials, and care plan provided today and declined offer to receive copy of patient instructions, educational materials, and care plan.   [name], PharmD, [place] [phone]

## 2021-05-07 ENCOUNTER — Ambulatory Visit: Payer: Self-pay | Admitting: Family Medicine

## 2021-05-07 DIAGNOSIS — I25708 Atherosclerosis of coronary artery bypass graft(s), unspecified, with other forms of angina pectoris: Secondary | ICD-10-CM | POA: Diagnosis not present

## 2021-05-08 ENCOUNTER — Ambulatory Visit (INDEPENDENT_AMBULATORY_CARE_PROVIDER_SITE_OTHER): Payer: Medicare Other | Admitting: Family Medicine

## 2021-05-08 ENCOUNTER — Encounter: Payer: Self-pay | Admitting: Family Medicine

## 2021-05-08 ENCOUNTER — Other Ambulatory Visit: Payer: Self-pay

## 2021-05-08 VITALS — BP 113/72 | HR 61 | Wt 195.0 lb

## 2021-05-08 DIAGNOSIS — I5020 Unspecified systolic (congestive) heart failure: Secondary | ICD-10-CM | POA: Diagnosis not present

## 2021-05-08 DIAGNOSIS — I495 Sick sinus syndrome: Secondary | ICD-10-CM | POA: Diagnosis not present

## 2021-05-08 DIAGNOSIS — I700 Atherosclerosis of aorta: Secondary | ICD-10-CM | POA: Diagnosis not present

## 2021-05-08 NOTE — Progress Notes (Signed)
Established patient visit   Patient: Taylor [name]   DOB: 08/04/1941   80 y.o. Male  MRN: [id number] Visit Date: 05/08/2021  Today's healthcare provider: Lelon Huh, MD   No chief complaint on file.  Subjective    HPI   Patient does not know what his appointment is for today. He was called last week and told to schedule an appointment. He just had follow up for diabetes in April and has follow up scheduled in August. He is followed by Dr. Nehemiah Massed for CAD s/p CABG, CHF and a-fib. He had stress test and echo yesterday with follow up visit scheduled with Dr. Nehemiah Massed on 6/20. He denies chest pains, heart flutters, and no change in breathing. He is already on states, ECASA and NOAC   Medications: Outpatient Medications Prior to Visit  Medication Sig   allopurinol (ZYLOPRIM) 300 MG tablet TAKE 2 TABLETS(600 MG) BY MOUTH DAILY   apixaban (ELIQUIS) 5 MG TABS tablet Take 5 mg by mouth 2 (two) times daily.   aspirin 81 MG EC tablet Take 81 mg by mouth daily.    atorvastatin (LIPITOR) 40 MG tablet TAKE 1 TABLET BY MOUTH EVERY DAY   Calcium Carbonate Antacid 600 MG chewable tablet Chew 1 tablet by mouth daily.   Cyanocobalamin (B-12) 2500 MCG TABS Take 2,500 mcg by mouth daily.   GLUCOSAMINE HCL PO Take 1,500 mg by mouth daily.    glucose blood (ONETOUCH VERIO) test strip Use as instructed to check sugar daily   losartan (COZAAR) 50 MG tablet TAKE 1 TABLET BY MOUTH DAILY   magnesium oxide (MAG-OX) 400 MG tablet Take 500 mg by mouth 2 (two) times daily.   metFORMIN (GLUCOPHAGE-XR) 500 MG 24 hr tablet Take 2 tablets (1,000 mg total) by mouth daily.   pantoprazole (PROTONIX) 40 MG tablet TAKE 1 TABLET BY MOUTH EVERY DAY   potassium chloride SA (KLOR-CON) 20 MEQ tablet Take 20 mEq by mouth daily.   tiZANidine (ZANAFLEX) 2 MG tablet Take 1-2 tablets (2-4 mg total) by mouth every 6 (six) hours as needed for muscle spasms.   torsemide (DEMADEX) 20 MG tablet Take 20 mg by mouth 2 (two) times  daily.   triamcinolone ointment (KENALOG) 0.1 % Apply 1 application topically 2 (two) times daily. As directed   No facility-administered medications prior to visit.    Review of Systems  Constitutional:  Positive for fatigue (Since Covid). Negative for activity change, appetite change, chills, diaphoresis, fever and unexpected weight change.  Respiratory:  Positive for shortness of breath (Since Covid). Negative for apnea, cough, choking, chest tightness, wheezing and stridor.   Cardiovascular: Negative.   Gastrointestinal: Negative.   Neurological:  Negative for dizziness, light-headedness and headaches.      Objective    BP 113/72 (BP Location: Right Arm, Patient Position: Sitting, Cuff Size: Large)   Pulse 61   Wt 195 lb (88.5 kg)   SpO2 99%   BMI 27.98 kg/m     Physical Exam   General: Appearance:     Overweight male in no acute distress  Eyes:    PERRL, conjunctiva/corneas clear, EOM's intact       Lungs:     Clear to auscultation bilaterally, respirations unlabored  Heart:    Normal heart rate. Irregularly irregular rhythm. No murmurs, rubs, or gallops.   MS:   All extremities are intact.    Neurologic:   Awake, alert, oriented x 3. No apparent focal neurological  defect.         Assessment & Plan     1. Aortic atherosclerosis (HCC) Asymptomatic. Compliant with medication.  Continue aggressive risk factor modification.  On statin NOAC and ECASA per cardiology   2. Systolic CHF with reduced left ventricular function, NYHA class 3 (HCC) Stable. Continue follow up Dr. Dionne Bucy as scheduled. Echo done at [facility] yesterday with report pending.   3. Sinoatrial node dysfunction ([place]) Continue routine follow up cardiology as scheduled.          The entirety of the information documented in the History of Present Illness, Review of Systems and Physical Exam were personally obtained by me. Portions of this information were initially documented by the CMA and  reviewed by me for thoroughness and accuracy.     Lelon Huh, MD  [facility] [phone] (phone) [phone] (fax)  [place]

## 2021-05-14 DIAGNOSIS — I10 Essential (primary) hypertension: Secondary | ICD-10-CM | POA: Diagnosis not present

## 2021-05-14 DIAGNOSIS — E782 Mixed hyperlipidemia: Secondary | ICD-10-CM | POA: Diagnosis not present

## 2021-05-14 DIAGNOSIS — I714 Abdominal aortic aneurysm, without rupture: Secondary | ICD-10-CM | POA: Diagnosis not present

## 2021-05-14 DIAGNOSIS — I255 Ischemic cardiomyopathy: Secondary | ICD-10-CM | POA: Diagnosis not present

## 2021-05-14 DIAGNOSIS — I5022 Chronic systolic (congestive) heart failure: Secondary | ICD-10-CM | POA: Diagnosis not present

## 2021-05-14 DIAGNOSIS — I6523 Occlusion and stenosis of bilateral carotid arteries: Secondary | ICD-10-CM | POA: Diagnosis not present

## 2021-05-14 DIAGNOSIS — I25708 Atherosclerosis of coronary artery bypass graft(s), unspecified, with other forms of angina pectoris: Secondary | ICD-10-CM | POA: Diagnosis not present

## 2021-05-24 ENCOUNTER — Telehealth: Payer: Self-pay

## 2021-05-24 NOTE — Progress Notes (Signed)
    Chronic Care Management Pharmacy Assistant   Name: Taylor [name]  MRN: [id number] DOB: 1940-11-29  Reason for Encounter: Medication Review/ Adherence Review   Recent office visits:  05/08/2021 Dr. [name] Section MD (PCP)  Recent consult visits:  No recent [facility] visits:  None in previous 6 months  Medications: Outpatient Encounter Medications as of 05/24/2021  Medication Sig Note   allopurinol (ZYLOPRIM) 300 MG tablet TAKE 2 TABLETS(600 MG) BY MOUTH DAILY    apixaban (ELIQUIS) 5 MG TABS tablet Take 5 mg by mouth 2 (two) times daily. 01/23/2021: Prescribed by Hassell Done, NP   aspirin 81 MG EC tablet Take 81 mg by mouth daily.     atorvastatin (LIPITOR) 40 MG tablet TAKE 1 TABLET BY MOUTH EVERY DAY    Calcium Carbonate Antacid 600 MG chewable tablet Chew 1 tablet by mouth daily.    Cyanocobalamin (B-12) 2500 MCG TABS Take 2,500 mcg by mouth daily.    GLUCOSAMINE HCL PO Take 1,500 mg by mouth daily.     glucose blood (ONETOUCH VERIO) test strip Use as instructed to check sugar daily    losartan (COZAAR) 50 MG tablet TAKE 1 TABLET BY MOUTH DAILY    magnesium oxide (MAG-OX) 400 MG tablet Take 500 mg by mouth 2 (two) times daily.    metFORMIN (GLUCOPHAGE-XR) 500 MG 24 hr tablet Take 2 tablets (1,000 mg total) by mouth daily.    pantoprazole (PROTONIX) 40 MG tablet TAKE 1 TABLET BY MOUTH EVERY DAY    potassium chloride SA (KLOR-CON) 20 MEQ tablet Take 20 mEq by mouth daily. 01/23/2021: Prescribed by Hassell Done, NP    tiZANidine (ZANAFLEX) 2 MG tablet Take 1-2 tablets (2-4 mg total) by mouth every 6 (six) hours as needed for muscle spasms.    torsemide (DEMADEX) 20 MG tablet Take 20 mg by mouth 2 (two) times daily. 01/23/2021: Prescribed by Dr. Nehemiah Massed    triamcinolone ointment (KENALOG) 0.1 % Apply 1 application topically 2 (two) times daily. As directed    No facility-administered encounter medications on file as of 05/24/2021.    Star Rating Drugs: Atorvastatin 40 mg  last filled on 03/21/2021 for 90 day supply at [facility] Metformin 500 mg  last filled on 03/21/2021 for 90 day supply at [facility]. Losartan 50 mg  last filled on 05/01/2021 for 90 day supply at [facility].  Care Gaps: Per patient chart,patient is due for the following care gaps, Ophthalmology Exam,Shingrix and COVID 19 Vaccine.  [place] Pharmacist Assistant [phone]

## 2021-05-31 ENCOUNTER — Telehealth: Payer: Self-pay

## 2021-05-31 NOTE — Progress Notes (Signed)
Unable to leave a message due to no voice box  to confirmed patient telephone appointment on 06/01/2021 for CCM at 9:00 am with [name] the Clinical pharmacist.    Star Rating Drug: Atorvastatin 40 mg last filled on 03/21/2021 for 90 day supply at [facility]. Metformin 500 mg  last filled on 03/21/2021 for 90 day supply at [facility]. Losartan 50 mg  last filled on 05/01/2021 for 90 day supply at [facility].  Any gaps in medications fill history? None ID   Anderson [place] Clinical Pharmacist Assistant [id number]

## 2021-06-01 ENCOUNTER — Telehealth: Payer: Self-pay

## 2021-06-04 ENCOUNTER — Ambulatory Visit (INDEPENDENT_AMBULATORY_CARE_PROVIDER_SITE_OTHER): Payer: Medicare Other

## 2021-06-04 DIAGNOSIS — E1159 Type 2 diabetes mellitus with other circulatory complications: Secondary | ICD-10-CM | POA: Diagnosis not present

## 2021-06-04 DIAGNOSIS — E1169 Type 2 diabetes mellitus with other specified complication: Secondary | ICD-10-CM | POA: Diagnosis not present

## 2021-06-04 DIAGNOSIS — M1A00X0 Idiopathic chronic gout, unspecified site, without tophus (tophi): Secondary | ICD-10-CM

## 2021-06-04 DIAGNOSIS — E785 Hyperlipidemia, unspecified: Secondary | ICD-10-CM

## 2021-06-04 DIAGNOSIS — E11622 Type 2 diabetes mellitus with other skin ulcer: Secondary | ICD-10-CM | POA: Diagnosis not present

## 2021-06-04 DIAGNOSIS — I152 Hypertension secondary to endocrine disorders: Secondary | ICD-10-CM

## 2021-06-04 NOTE — Progress Notes (Signed)
Chronic Care Management Pharmacy Note  06/07/2021 Name:  Taylor [name] MRN:  [id number] DOB:  1940-11-26  Summary: Patient is doing much better this week. His leg wound has resolved and his blood sugars are much improved. He has not been as active recently due to decreased exercise tolerance and a fall while speed skating. He is interested in trying to decrease the number of medications he is taking if possible.   Recommendations/Changes made from today's visit: Could consider decreasing allopurinol to 300 mg daily given history of good control.   Plan: CPP follow-up in 4 months.   Subjective: Taylor [name] is an 80 y.o. year old male who is a primary patient of [name], [name], MD.  The CCM team was consulted for assistance with disease management and care coordination needs.    Engaged with patient by telephone for follow up visit in response to provider referral for pharmacy case management and/or care coordination services.   Consent to Services:  The patient was given information about Chronic Care Management services, agreed to services, and gave verbal consent prior to initiation of services.  Please see initial visit note for detailed documentation.   Patient Care Team: Birdie Sons, MD as PCP - General (Family Medicine) Corey Skains, MD as Consulting Physician (Cardiology) Birder Robson, MD as Referring Physician (Ophthalmology) Ottie Glazier, MD as Consulting Physician (Pulmonary Disease) Germaine Pomfret, [facility] (Pharmacist)  Recent office visits: 05/08/21: Patient presented to Dr. [name] Section for follow-up.  04/18/21: Video visit with Dr. [name] Section for leg abscess. Doxycycline 100 mg BID started.  02/23/21: Patient presented to Dr. [name] Section for follow-up. A1c improved to 7.1%. eGFr 47. Mag 1.4. Tizanidine started.  11/21/20: Patient presented to Dr. [name] Section for follow-up. Breo Elipta and furosemide stopped.   Recent consult visits: 05/14/21: Patient preesnted to Dr.  Nehemiah Massed (Cardiology) for follow-up.  03/01/21: Patient presented to Dr. Nehemiah Massed (Cardiology) for follow-up. Carvedilol 6.25 mg twice daily  11/30/20: Patient presented to Hassell Done, NP (Cardiology) for follow-up. Eliquis 5 mg twice daily started for anticoagulation.   Hospital visits: None in previous 6 months   Objective:  Lab Results  Component Value Date   CREATININE 1.52 (H) 02/23/2021   BUN 29 (H) 02/23/2021   GFRNONAA 52 (L) 11/21/2020   GFRAA 60 11/21/2020   NA 143 02/23/2021   K 4.5 02/23/2021   CALCIUM 9.7 02/23/2021   CO2 25 02/23/2021   GLUCOSE 111 (H) 02/23/2021    Lab Results  Component Value Date/Time   HGBA1C 7.1 (A) 02/23/2021 01:53 PM   HGBA1C 8.2 (H) 10/04/2020 09:05 AM   HGBA1C 7.7 (A) 12/14/2019 10:31 AM   HGBA1C 6.3 (H) 05/12/2019 09:14 AM    Last diabetic Eye exam: No results found for: HMDIABEYEEXA  Last diabetic Foot exam: No results found for: HMDIABFOOTEX   Lab Results  Component Value Date   CHOL 156 11/22/2019   HDL 43 11/22/2019   LDLCALC 77 11/22/2019   TRIG 182 (H) 11/22/2019   CHOLHDL 3.6 11/22/2019    Hepatic Function Latest Ref Rng & Units 02/23/2021 11/21/2020 10/04/2020  Total Protein 6.5 - 8.1 g/dL - - -  Albumin 3.7 - 4.7 g/dL 4.3 3.8 4.0  AST 15 - 41 U/L - - -  ALT 0 - 44 U/L - - -  Alk Phosphatase 38 - 126 U/L - - -  Total Bilirubin 0.3 - 1.2 mg/dL - - -  Bilirubin, Direct 0.0 - 0.2 mg/dL - - -  Lab Results  Component Value Date/Time   TSH 1.331 11/23/2019 04:58 AM   TSH 3.430 05/15/2015 09:08 AM    CBC Latest Ref Rng & Units 08/11/2020 08/10/2020 08/09/2020  WBC 4.0 - 10.5 K/uL 9.4 11.5(H) 11.8(H)  Hemoglobin 13.0 - 17.0 g/dL 13.3 13.8 13.3  Hematocrit 39.0 - 52.0 % 41.6 42.5 42.3  Platelets 150 - 400 K/uL 105(L) 122(L) 129(L)    Lab Results  Component Value Date/Time   VD25OH 46.58 04/19/2020 04:16 PM   VD25OH 47.4 05/15/2015 09:08 AM    Clinical ASCVD: Yes  The ASCVD Risk score [name], et al., 2013)  failed to calculate for the following reasons:   The patient has a prior MI or stroke diagnosis    Depression screen [facility] 2/9 05/08/2021 06/29/2020 05/11/2019  Decreased Interest 1 0 0  Down, Depressed, Hopeless 1 0 0  PHQ - 2 Score 2 0 0  Altered sleeping 0 - 0  Tired, decreased energy 1 - 0  Change in appetite 0 - 0  Feeling bad or failure about yourself  0 - 0  Trouble concentrating 0 - 0  Moving slowly or fidgety/restless 0 - 0  Suicidal thoughts 0 - 0  PHQ-9 Score 3 - 0  Difficult doing work/chores Not difficult at all - Not difficult at all    Social History   Tobacco Use  Smoking Status Former   Packs/day: 1.00   Types: Cigarettes   Quit date: 11/26/1999   Years since quitting: 21.5  Smokeless Tobacco Never   BP Readings from Last 3 Encounters:  05/08/21 113/72  02/23/21 124/70  11/21/20 127/69   Pulse Readings from Last 3 Encounters:  05/08/21 61  02/23/21 62  11/21/20 64   Wt Readings from Last 3 Encounters:  05/08/21 195 lb (88.5 kg)  02/23/21 205 lb (93 kg)  11/21/20 206 lb (93.4 kg)   BMI Readings from Last 3 Encounters:  05/08/21 27.98 kg/m  02/23/21 29.41 kg/m  11/21/20 29.56 kg/m    Assessment/Interventions: Review of patient past medical history, allergies, medications, health status, including review of consultants reports, laboratory and other test data, was performed as part of comprehensive evaluation and provision of chronic care management services.   SDOH:  (Social Determinants of Health) assessments and interventions performed: Yes SDOH Interventions    Flowsheet Row Most Recent Value  SDOH Interventions   Financial Strain Interventions Intervention Not Indicated      SDOH Screenings   Alcohol Screen: Low Risk    Last Alcohol Screening Score (AUDIT): 0  Depression (PHQ2-9): Low Risk    PHQ-2 Score: 3  Financial Resource Strain: Low Risk    Difficulty of Paying Living Expenses: Not hard at all  Food Insecurity: No Food Insecurity    Worried About Charity fundraiser in the Last Year: Never true   Ran Out of Food in the Last Year: Never true  Housing: Low Risk    Last Housing Risk Score: 0  Physical Activity: Insufficiently Active   Days of Exercise per Week: 1 day   Minutes of Exercise per Session: 120 min  Social Connections: Engineer, building services of Communication with Friends and Family: Twice a week   Frequency of Social Gatherings with Friends and Family: Twice a week   Attends Religious Services: More than 4 times per year   Active Member of [employer] or Organizations: Yes   Attends Archivist Meetings: More than 4 times per year  Marital Status: Married  Stress: No Stress Concern Present   Feeling of Stress : Not at all  Tobacco Use: Medium Risk   Smoking Tobacco Use: Former   Smokeless Tobacco Use: Never  Transportation Needs: No Data processing manager (Medical): No   Lack of Transportation (Non-Medical): No    CCM Care Plan  Allergies  Allergen Reactions   Amlodipine Besylate Swelling    Had a reaction when taking with colcrys    Crestor [Rosuvastatin]     Muscle cramps and pain   Rocephin [Ceftriaxone]     unknown    Medications Reviewed Today     Reviewed by Germaine Pomfret, [facility] (Pharmacist) on 06/04/21 at 1444  Med List Status: <None>   Medication Order Taking? Sig Documenting Provider Last Dose Status Informant  allopurinol (ZYLOPRIM) 300 MG tablet [id number]  TAKE 2 TABLETS(600 MG) BY MOUTH DAILY [name], [name], MD  Active   apixaban (ELIQUIS) 5 MG TABS tablet [id number]  Take 5 mg by mouth 2 (two) times daily. [provider]  Active            Med Note Michaelle Birks, [place] Jan 23, 2021  2:10 PM) Prescribed by Hassell Done, NP  aspirin 81 MG EC tablet [id number]  Take 81 mg by mouth daily.  [provider]  Active Self           Med Note Germaine Pomfret   Wed Jan 24, 2021  8:17 AM)    atorvastatin (LIPITOR) 40  MG tablet [id number]  TAKE 1 TABLET BY MOUTH EVERY DAY Birdie Sons, MD  Active   Calcium Carbonate Antacid 600 MG chewable tablet [id number]  Chew 1 tablet by mouth daily. [provider]  Active Self  Cyanocobalamin (B-12) 2500 MCG TABS [id number]  Take 2,500 mcg by mouth daily. [provider]  Active   GLUCOSAMINE HCL PO [id number]  Take 1,500 mg by mouth daily.  [provider]  Active Self  glucose blood (ONETOUCH VERIO) test strip [id number]  Use as instructed to check sugar daily Birdie Sons, MD  Active Self  losartan (COZAAR) 50 MG tablet [id number]  TAKE 1 TABLET BY MOUTH DAILY Birdie Sons, MD  Active Self  magnesium oxide (MAG-OX) 400 MG tablet [id number]  Take 500 mg by mouth 2 (two) times daily. [provider]  Active Self  metFORMIN (GLUCOPHAGE-XR) 500 MG 24 hr tablet [id number] Yes Take 2 tablets (1,000 mg total) by mouth daily. Birdie Sons, MD Taking Active   pantoprazole (PROTONIX) 40 MG tablet [id number]  TAKE 1 TABLET BY MOUTH EVERY DAY Birdie Sons, MD  Active Self  potassium chloride SA (KLOR-CON) 20 MEQ tablet [id number]  Take 20 mEq by mouth daily. [provider]  Active            Med Note Michaelle Birks, [place] Jan 23, 2021  2:10 PM) Prescribed by Hassell Done, NP   tiZANidine (ZANAFLEX) 2 MG tablet [id number]  Take 1-2 tablets (2-4 mg total) by mouth every 6 (six) hours as needed for muscle spasms. Birdie Sons, MD  Active   torsemide (DEMADEX) 20 MG tablet [id number]  Take 20 mg by mouth 2 (two) times daily. [provider]  Active            Med Note Michaelle Birks, [place] Jan 23, 2021  2:10 PM) Prescribed by Dr. Nehemiah Massed  triamcinolone ointment (KENALOG) 0.1 % [id number]  Apply 1 application topically 2 (two) times daily. As directed Birdie Sons, MD  Active             Patient Active Problem List   Diagnosis Date Noted   Aortic atherosclerosis ([place]) 05/08/2021   Diabetes  mellitus without complication ([place]) 63/11/6008   Diarrhea 09/12/2020   Acute kidney injury superimposed on CKD ([place]) 08/10/2020   Hypomagnesemia 04/26/2020   Hypocalcemia 04/19/2020   Numbness and tingling in both hands 04/19/2020   Atrial fibrillation ([place]) 04/19/2020   Other specified interstitial pulmonary diseases ([place]) 12/06/2019   Abdominal pain    Suspected COVID-19 virus infection 11/22/2019   Pancreatitis 11/22/2019   CAP (community acquired pneumonia) 11/13/2019   Hyperuricemia 03/31/2019   Numbness 05/07/2018   Benign colonic polyp 03/24/2017   Tibialis anterior tenosynovitis 12/21/2015   Pleural nodule 07/27/2015   Psoriatic arthritis ([place]) 05/12/2015   Restrictive lung disease 05/12/2015   Barrett esophagus 03/30/2015   Ache in joint 03/30/2015   Bradycardia 03/30/2015   CAD (coronary artery disease) 03/30/2015   Cardiac defibrillator in place 03/30/2015   Chronic kidney disease (CKD), stage III (moderate) ([place]) 03/30/2015   Cluster headache syndrome 03/30/2015   Degeneration of lumbar or lumbosacral intervertebral disc 03/30/2015   Gout 03/30/2015   Personal history of malignant neoplasm of bladder 03/30/2015   Hypercholesteremia 03/30/2015   LBP (low back pain) 03/30/2015   Personal history of malignant melanoma of skin 03/30/2015   Muscle ache 03/30/2015   Arthritis of knee, degenerative 03/30/2015   Prediabetes 03/30/2015   Psoriasis 03/30/2015   Obstructive sleep apnea 03/30/2015   Chronic venous insufficiency 03/30/2015   History of abdominal aortic aneurysm (AAA) 06/24/2013   History of CVA (cerebrovascular accident) 06/24/2013   Cardiomyopathy, ischemic 06/24/2013   History of AAA (abdominal aortic aneurysm) repair 06/24/2013   Essential (primary) hypertension 08/17/2011   Arrhythmia, sinus node 08/17/2011   History of ventricular fibrillation 93/23/5573   Systolic CHF with reduced left ventricular function, NYHA class 3 ([place]) 02/26/2010    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 04/27/2007   Cardiac pacemaker in situ 10/10/2006    Immunization History  Administered Date(s) Administered   Fluad Quad(high Dose 65+) 10/04/2020   Influenza Split 09/18/2011   Influenza, High Dose Seasonal PF 08/16/2014, 10/09/2016, 10/09/2017, 08/26/2018   Influenza,inj,Quad PF,6+ Mos 12/20/2015   PFIZER(Purple Top)SARS-COV-2 Vaccination 02/24/2020, 03/21/2020   Pneumococcal Conjugate-13 05/09/2014   Pneumococcal Polysaccharide-23 05/12/2015    Conditions to be addressed/monitored:  Hypertension, Hyperlipidemia, Diabetes, Atrial Fibrillation, Heart Failure, Coronary Artery Disease, Chronic Kidney Disease and Gout  Care Plan : General Pharmacy (Adult)  Updates made by Germaine Pomfret, RPH since 06/07/2021 12:00 AM     Problem: Hypertension, Hyperlipidemia, Diabetes, Atrial Fibrillation, Heart Failure, Coronary Artery Disease, Chronic Kidney Disease and Gout   Priority: High     Long-Range Goal: Patient-Specific Goal   Start Date: 01/23/2021  Expected End Date: 07/26/2021  This Visit's Progress: On track  Recent Progress: Not on track  Priority: High  Note:   Current Barriers:  Unable to independently afford treatment regimen Unable to achieve control of diabetes   Pharmacist Clinical Goal(s):  Over the next 90 days, patient will verbalize ability to afford treatment regimen achieve control of diabetes as evidenced by A1c less than 8% through collaboration with PharmD and provider.   Interventions: 1:1 collaboration with Birdie Sons, MD regarding development and update of comprehensive plan of care as  evidenced by provider attestation and co-signature Inter-disciplinary care team collaboration (see longitudinal plan of care) Comprehensive medication review performed; medication list updated in electronic medical record  Hyperlipidemia: (LDL goal < 70) -History of CAD, history of CVA  -Not ideally  controlled -Current treatment: Atorvastatin 40 mg daily  -Medications previously tried: NA  -Educated on Benefits of statin for ASCVD risk reduction; Importance of limiting foods high in cholesterol; -Recommended to continue current medication  Diabetes (A1c goal <8%) -Diagnosed Jan 2021 -Uncontrolled -Current medications: Metformin XR 500 mg 2 tablets daily  -Medications previously tried: NA  -Current home glucose readings fasting glucose:  Post-Breakfast: 132,135 -Denies hypoglycemic/hyperglycemic symptoms  -Current meal patterns: cut out sodas. Replaced with twist water. 2 sandwiches with lunch down to 1.  -Continue current medications   Atrial Fibrillation (Goal: prevent stroke and major bleeding) -Controlled -CHADSVASC: 8 -Current treatment: Rate control: Carvedilol 12.5 mg daily  Anticoagulation: Eliquis 5 mg twice daily   -Medications previously tried: NA -Home BP and HR readings: NA  -Patient continues to have nose bleeds sporadically, but less frequently.  -Recommended to continue current medication  Heart Failure (Goal: control symptoms and prevent exacerbations) -Controlled Type: Systolic -NYHA Class: III (marked limitation of activity) -Ejection fraction: 35-40% (Date: Sep 2021) -Current treatment: Carvedilol 12.5 mg 1/2 tablet twice daily   Losartan 50 mg daily  Torsemide 20 mg twice daily  -Medications previously tried: Entresto, Amlodipine, valsartan, spironolactone,  -Current home BP/HR readings: NA -weight has been stable, ranging from 187-195  -Educated on Importance of weighing daily; if you gain more than 3 pounds in one day or 5 pounds in one week, contact provider's office  Gout (Goal: prevent gout flares) -Controlled -Current treatment  Allopurinol 300 mg 2 tablets daily  -Medications previously tried: NA  -Last flare: 2 years  -Counseled on diet and exercise extensively Recommended to continue current medication  GERD (Goal: Minimize  symptoms of heartburn or reflux) -History of Barrett's Esophagus  -Controlled -Current treatment  Pantoprazole 40 mg daily  -Medications previously tried: NA  -Recommended to continue current medication  Chronic Kidney Disease Stage 3a  -All medications assessed for renal dosing and appropriateness in chronic kidney disease. -Recommended to continue current medication  Patient Goals/Self-Care Activities Over the next 90 days, patient will:  - check glucose daily before breakfast, document, and provide at future appointments check blood pressure 2-3 times weekly , document, and provide at future appointments weigh daily, and contact provider if weight gain of greater than 3 pounds in one day  Follow Up Plan: Telephone follow up appointment with care management team member scheduled for:  10/05/2021 at 1:00 PM       Medication Assistance: Application for Eliquis  medication assistance program. in process.  Anticipated assistance start date TBD.  See plan of care for additional detail.  Compliance/Adherence/Medication fill history: Care Gaps: Ophthalmology Exam  Shingrix, Covid, Pneumonia Vaccines   Star-Rating Drugs: Atorvastatin 40 mg: LF 03/21/21 for 90-DS Losartan 50 mg: LF 02/05/21 for 90-DS Metformin XR: LF 03/21/21 for 90-DS  Patient's preferred pharmacy is:  [id number] - [address], [address] [place] [address] Phone: [phone] Fax: [phone]  Uses pill box? Yes Pt endorses 100% compliance  We discussed: Current pharmacy is preferred with insurance plan and patient is satisfied with pharmacy services Patient decided to: Continue current medication management strategy  Care Plan and Follow Up Patient  Decision:  Patient agrees to Care Plan and Follow-up.  Plan: Telephone follow up appointment with care management team member scheduled for:  10/05/2021 at 1:00 PM  [name], PharmD,  [place] [phone]

## 2021-06-07 NOTE — Patient Instructions (Signed)
Visit Information It was great speaking with you today!  Please let me know if you have any questions about our visit.   Goals Addressed             This Visit's Progress    Monitor and Manage My Blood Sugar-Diabetes Type 2   On track    Timeframe:  Long-Range Goal Priority:  High Start Date:  01/23/2021                           Expected End Date: 07/26/2021                  Follow Up Date 06/01/2021    -Check blood sugar once daily, before breakfast  - check blood sugar if I feel it is too high or too low - enter blood sugar readings and medication or insulin into daily log    Why is this important?   Checking your blood sugar at home helps to keep it from getting very high or very low.  Writing the results in a diary or log helps the doctor know how to care for you.  Your blood sugar log should have the time, date and the results.  Also, write down the amount of insulin or other medicine that you take.  Other information, like what you ate, exercise done and how you were feeling, will also be helpful.     Notes:         Patient Care Plan: General Pharmacy (Adult)     Problem Identified: Hypertension, Hyperlipidemia, Diabetes, Atrial Fibrillation, Heart Failure, Coronary Artery Disease, Chronic Kidney Disease and Gout   Priority: High     Long-Range Goal: Patient-Specific Goal   Start Date: 01/23/2021  Expected End Date: 07/26/2021  This Visit's Progress: On track  Recent Progress: Not on track  Priority: High  Note:   Current Barriers:  Unable to independently afford treatment regimen Unable to achieve control of diabetes   Pharmacist Clinical Goal(s):  Over the next 90 days, patient will verbalize ability to afford treatment regimen achieve control of diabetes as evidenced by A1c less than 8% through collaboration with PharmD and provider.   Interventions: 1:1 collaboration with Birdie Sons, MD regarding development and update of comprehensive plan of care as  evidenced by provider attestation and co-signature Inter-disciplinary care team collaboration (see longitudinal plan of care) Comprehensive medication review performed; medication list updated in electronic medical record  Hyperlipidemia: (LDL goal < 70) -History of CAD, history of CVA  -Not ideally controlled -Current treatment: Atorvastatin 40 mg daily  -Medications previously tried: NA  -Educated on Benefits of statin for ASCVD risk reduction; Importance of limiting foods high in cholesterol; -Recommended to continue current medication  Diabetes (A1c goal <8%) -Diagnosed Jan 2021 -Uncontrolled -Current medications: Metformin XR 500 mg 2 tablets daily  -Medications previously tried: NA  -Current home glucose readings fasting glucose:  Post-Breakfast: 132,135 -Denies hypoglycemic/hyperglycemic symptoms  -Current meal patterns: cut out sodas. Replaced with twist water. 2 sandwiches with lunch down to 1.  -Continue current medications   Atrial Fibrillation (Goal: prevent stroke and major bleeding) -Controlled -CHADSVASC: 8 -Current treatment: Rate control: Carvedilol 12.5 mg daily  Anticoagulation: Eliquis 5 mg twice daily   -Medications previously tried: NA -Home BP and HR readings: NA  -Patient continues to have nose bleeds sporadically, but less frequently.  -Recommended to continue current medication  Heart Failure (Goal: control symptoms and prevent exacerbations) -Controlled  Type: Systolic -NYHA Class: III (marked limitation of activity) -Ejection fraction: 35-40% (Date: Sep 2021) -Current treatment: Carvedilol 12.5 mg 1/2 tablet twice daily   Losartan 50 mg daily  Torsemide 20 mg twice daily  -Medications previously tried: Entresto, Amlodipine, valsartan, spironolactone,  -Current home BP/HR readings: NA -weight has been stable, ranging from 187-195  -Educated on Importance of weighing daily; if you gain more than 3 pounds in one day or 5 pounds in one week,  contact provider's office  Gout (Goal: prevent gout flares) -Controlled -Current treatment  Allopurinol 300 mg 2 tablets daily  -Medications previously tried: NA  -Last flare: 2 years  -Counseled on diet and exercise extensively Recommended to continue current medication  GERD (Goal: Minimize symptoms of heartburn or reflux) -History of Barrett's Esophagus  -Controlled -Current treatment  Pantoprazole 40 mg daily  -Medications previously tried: NA  -Recommended to continue current medication  Chronic Kidney Disease Stage 3a  -All medications assessed for renal dosing and appropriateness in chronic kidney disease. -Recommended to continue current medication  Patient Goals/Self-Care Activities Over the next 90 days, patient will:  - check glucose daily before breakfast, document, and provide at future appointments check blood pressure 2-3 times weekly , document, and provide at future appointments weigh daily, and contact provider if weight gain of greater than 3 pounds in one day  Follow Up Plan: Telephone follow up appointment with care management team member scheduled for:  10/05/2021 at 1:00 PM      Patient agreed to services and verbal consent obtained.   The patient verbalized understanding of instructions, educational materials, and care plan provided today and declined offer to receive copy of patient instructions, educational materials, and care plan.   [name], PharmD, [place] [phone]

## 2021-06-14 ENCOUNTER — Other Ambulatory Visit: Payer: Self-pay | Admitting: Family Medicine

## 2021-06-14 DIAGNOSIS — M1000 Idiopathic gout, unspecified site: Secondary | ICD-10-CM

## 2021-06-25 ENCOUNTER — Ambulatory Visit (INDEPENDENT_AMBULATORY_CARE_PROVIDER_SITE_OTHER): Payer: Medicare Other | Admitting: Family Medicine

## 2021-06-25 ENCOUNTER — Encounter: Payer: Self-pay | Admitting: Family Medicine

## 2021-06-25 ENCOUNTER — Other Ambulatory Visit: Payer: Self-pay

## 2021-06-25 VITALS — BP 126/76 | HR 60 | Wt 199.0 lb

## 2021-06-25 DIAGNOSIS — E119 Type 2 diabetes mellitus without complications: Secondary | ICD-10-CM | POA: Diagnosis not present

## 2021-06-25 DIAGNOSIS — E7800 Pure hypercholesterolemia, unspecified: Secondary | ICD-10-CM | POA: Diagnosis not present

## 2021-06-25 DIAGNOSIS — N1831 Chronic kidney disease, stage 3a: Secondary | ICD-10-CM | POA: Diagnosis not present

## 2021-06-25 DIAGNOSIS — I10 Essential (primary) hypertension: Secondary | ICD-10-CM | POA: Diagnosis not present

## 2021-06-25 DIAGNOSIS — Z125 Encounter for screening for malignant neoplasm of prostate: Secondary | ICD-10-CM

## 2021-06-25 DIAGNOSIS — H04203 Unspecified epiphora, bilateral lacrimal glands: Secondary | ICD-10-CM

## 2021-06-25 DIAGNOSIS — R197 Diarrhea, unspecified: Secondary | ICD-10-CM

## 2021-06-25 DIAGNOSIS — E790 Hyperuricemia without signs of inflammatory arthritis and tophaceous disease: Secondary | ICD-10-CM

## 2021-06-25 LAB — POCT GLYCOSYLATED HEMOGLOBIN (HGB A1C): Hemoglobin A1C: 6.3 % — AB (ref 4.0–5.6)

## 2021-06-25 NOTE — Patient Instructions (Addendum)
Please review the attached list of medications and notify my office if there are any errors.   The CDC recommends two doses of Shingrix (the shingles vaccine) separated by 2 to 6 months for adults age 79 years and older. I recommend checking with your pharmacy plan regarding coverage for this vaccine.   Please contact your eyecare professional to schedule a routine eye exam

## 2021-06-25 NOTE — Progress Notes (Signed)
Established patient visit   Patient: Taylor [name]   DOB: 09/20/1941   80 y.o. Male  MRN: [id number] Visit Date: 06/25/2021  Today's healthcare provider: Lelon Huh, MD   No chief complaint on file.  Subjective    HPI  Diabetes Mellitus Type II, follow-up  Lab Results  Component Value Date   HGBA1C 7.1 (A) 02/23/2021   HGBA1C 8.2 (H) 10/04/2020   HGBA1C 7.7 (A) 12/14/2019   Last seen for diabetes 4 months ago.  Management since then includes; Much better, continue metformin. Follow up 4 months. He reports excellent compliance with treatment. He is having side effects. Metformin in causing diarrhea in the mornings.   Home blood sugar records: fasting range: mid 100's  Episodes of hypoglycemia? No    Current insulin regiment: None Most Recent Eye Exam: Pt is due for an eye exam  --------------------------------------------------------------------------------------------------- Hypertension, follow-up  BP Readings from Last 3 Encounters:  06/25/21 126/76  05/08/21 113/72  02/23/21 124/70   Wt Readings from Last 3 Encounters:  06/25/21 199 lb (90.3 kg)  05/08/21 195 lb (88.5 kg)  02/23/21 205 lb (93 kg)     He was last seen for hypertension 4 months ago.  BP at that visit was 124/70. Management since that visit includes; on losartan. He reports excellent compliance with treatment. He is not having side effects.  He is not exercising. Has not been exercising since covid.  He is adherent to low salt diet.   Outside blood pressures are normal at home.  He does not smoke.  Use of agents associated with hypertension: none.   --------------------------------------------------------------------------------------------------- Lipid/Cholesterol, follow-up  Last Lipid Panel: Lab Results  Component Value Date   CHOL 156 11/22/2019   LDLCALC 77 11/22/2019   HDL 43 11/22/2019   TRIG 182 (H) 11/22/2019    He was last seen for this 11/22/2019.   Management since that visit includes; on atorvastatin.  He reports excellent compliance with treatment. He is not having side effects.   Last metabolic panel Lab Results  Component Value Date   GLUCOSE 111 (H) 02/23/2021   NA 143 02/23/2021   K 4.5 02/23/2021   BUN 29 (H) 02/23/2021   CREATININE 1.52 (H) 02/23/2021   GFRNONAA 52 (L) 11/21/2020   GFRAA 60 11/21/2020   CALCIUM 9.7 02/23/2021   AST 26 04/19/2020   ALT 13 04/19/2020   The ASCVD Risk score (Goff DC Jr., et al., 2013) failed to calculate for the following reasons:   The patient has a prior MI or stroke diagnosis  ---------------------------------------------------------------------------------------------------      Medications: Outpatient Medications Prior to Visit  Medication Sig   allopurinol (ZYLOPRIM) 300 MG tablet TAKE 2 TABLETS(600 MG) BY MOUTH DAILY   apixaban (ELIQUIS) 5 MG TABS tablet Take 5 mg by mouth 2 (two) times daily.   aspirin 81 MG EC tablet Take 81 mg by mouth daily.    atorvastatin (LIPITOR) 40 MG tablet TAKE 1 TABLET BY MOUTH EVERY DAY   Calcium Carbonate Antacid 600 MG chewable tablet Chew 1 tablet by mouth daily.   Cyanocobalamin (B-12) 2500 MCG TABS Take 2,500 mcg by mouth daily.   GLUCOSAMINE HCL PO Take 1,500 mg by mouth daily.    glucose blood (ONETOUCH VERIO) test strip Use as instructed to check sugar daily   losartan (COZAAR) 50 MG tablet TAKE 1 TABLET BY MOUTH DAILY   magnesium oxide (MAG-OX) 400 MG tablet Take 500 mg by mouth 2 (two) times  daily.   metFORMIN (GLUCOPHAGE-XR) 500 MG 24 hr tablet Take 2 tablets (1,000 mg total) by mouth daily.   pantoprazole (PROTONIX) 40 MG tablet TAKE 1 TABLET BY MOUTH EVERY DAY   potassium chloride SA (KLOR-CON) 20 MEQ tablet Take 20 mEq by mouth daily.   torsemide (DEMADEX) 20 MG tablet Take 20 mg by mouth 2 (two) times daily.   triamcinolone ointment (KENALOG) 0.1 % Apply 1 application topically 2 (two) times daily. As directed   tiZANidine  (ZANAFLEX) 2 MG tablet Take 1-2 tablets (2-4 mg total) by mouth every 6 (six) hours as needed for muscle spasms.   No facility-administered medications prior to visit.    Review of Systems  Constitutional:  Positive for fatigue (Since Covid). Negative for activity change, appetite change, chills, diaphoresis, fever and unexpected weight change.  Respiratory: Negative.    Cardiovascular: Negative.   Gastrointestinal:  Positive for diarrhea (After taking Metformin). Negative for abdominal distention, abdominal pain, anal bleeding, blood in stool, constipation, nausea, rectal pain and vomiting.  Endocrine: Negative.   Skin:  Negative for wound.  Neurological:  Negative for dizziness, light-headedness and headaches.      Objective    BP 126/76 (BP Location: Right Arm, Patient Position: Sitting, Cuff Size: Large)   Pulse 60   Wt 199 lb (90.3 kg)   SpO2 98%   BMI 28.55 kg/m     Physical Exam    General: Appearance:     Overweight male in no acute distress  Eyes:    PERRL, conjunctiva/corneas clear, EOM's intact       Lungs:     Clear to auscultation bilaterally, respirations unlabored  Heart:    Normal heart rate. Regular rhythm. No murmurs, rubs, or gallops.    MS:   All extremities are intact.    Neurologic:   Awake, alert, oriented x 3. No apparent focal neurological defect.       Results for orders placed or performed in visit on 06/25/21  POCT glycosylated hemoglobin (Hb A1C)  Result Value Ref Range   Hemoglobin A1C 6.3 (A) 4.0 - 5.6 %      Assessment & Plan     1. Essential (primary) hypertension Well controlled.  Continue current medications.   - CBC - Comprehensive metabolic panel  2. Diabetes mellitus without complication ([place]) Doing very well on metformin.   3. Hypercholesteremia He is tolerating atorvastatin well with no adverse effects.   - Lipid panel  4. Prostate cancer screening  - PSA Total (Reflex To Free) ([facility] only)  5. Chronic kidney  disease, stage 3a ([place]) Labs done today   6. Hypocalcemia Labs done today  7. Hypomagnesemia  - Magnesium  8. Diarrhea, unspecified type Complains of having loose bowel movements twice a day most days, which is probably side effect of magnesium supplements. Will consider reducing supplementation if magnesium levels are improved today.   9. Watery eyes No other s/s of allergic conjunctivitis. Recommend he follow up with his eye doctor to have this evaluated and for diabetic eye exam.   10. Hyperuricemia Doing well on allopurinol.  - Uric acid   Future Appointments  Date Time Provider [place]  10/05/2021  1:00 PM [facility] PHARMACIST [facility]  11/07/2021  9:00 AM [facility] Robertshaw, [name], MD [facility]        The entirety of the information documented in the History of Present Illness, Review of Systems and Physical Exam were personally obtained by me. Portions of this information  were initially documented by the CMA and reviewed by me for thoroughness and accuracy.     Lelon Huh, MD  [facility] [phone] (phone) [phone] (fax)  [place]

## 2021-06-26 ENCOUNTER — Other Ambulatory Visit: Payer: Self-pay | Admitting: Family Medicine

## 2021-06-26 DIAGNOSIS — N1832 Chronic kidney disease, stage 3b: Secondary | ICD-10-CM

## 2021-06-26 LAB — LIPID PANEL
Chol/HDL Ratio: 3.1 ratio (ref 0.0–5.0)
Cholesterol, Total: 107 mg/dL (ref 100–199)
HDL: 34 mg/dL — ABNORMAL LOW (ref >39)
LDL Chol Calc (NIH): 55 mg/dL (ref 0–99)
Triglycerides: 94 mg/dL (ref 0–149)
VLDL Cholesterol Cal: 18 mg/dL (ref 5–40)

## 2021-06-26 LAB — COMPREHENSIVE METABOLIC PANEL
ALT: 12 IU/L (ref 0–44)
AST: 22 IU/L (ref 0–40)
Albumin/Globulin Ratio: 1.3 (ref 1.2–2.2)
Albumin: 4 g/dL (ref 3.7–4.7)
Alkaline Phosphatase: 132 IU/L — ABNORMAL HIGH (ref 44–121)
BUN/Creatinine Ratio: 14 (ref 10–24)
BUN: 29 mg/dL — ABNORMAL HIGH (ref 8–27)
Bilirubin Total: 1 mg/dL (ref 0.0–1.2)
CO2: 25 mmol/L (ref 20–29)
Calcium: 9.8 mg/dL (ref 8.6–10.2)
Chloride: 102 mmol/L (ref 96–106)
Creatinine, Ser: 2.02 mg/dL — ABNORMAL HIGH (ref 0.76–1.27)
Globulin, Total: 3.2 g/dL (ref 1.5–4.5)
Glucose: 110 mg/dL — ABNORMAL HIGH (ref 65–99)
Potassium: 4.4 mmol/L (ref 3.5–5.2)
Sodium: 143 mmol/L (ref 134–144)
Total Protein: 7.2 g/dL (ref 6.0–8.5)
eGFR: 33 mL/min/{1.73_m2} — ABNORMAL LOW (ref >59)

## 2021-06-26 LAB — CBC
Hematocrit: 45.1 % (ref 37.5–51.0)
Hemoglobin: 14.9 g/dL (ref 13.0–17.7)
MCH: 32.7 pg (ref 26.6–33.0)
MCHC: 33 g/dL (ref 31.5–35.7)
MCV: 99 fL — ABNORMAL HIGH (ref 79–97)
Platelets: 124 10*3/uL — ABNORMAL LOW (ref 150–450)
RBC: 4.56 x10E6/uL (ref 4.14–5.80)
RDW: 15 % (ref 11.6–15.4)
WBC: 7.2 10*3/uL (ref 3.4–10.8)

## 2021-06-26 LAB — URIC ACID: Uric Acid: 3.8 mg/dL (ref 3.8–8.4)

## 2021-06-26 LAB — MAGNESIUM: Magnesium: 1.5 mg/dL — ABNORMAL LOW (ref 1.6–2.3)

## 2021-06-26 LAB — PSA TOTAL (REFLEX TO FREE): Prostate Specific Ag, Serum: 1.4 ng/mL (ref 0.0–4.0)

## 2021-07-04 ENCOUNTER — Other Ambulatory Visit: Payer: Self-pay | Admitting: Family Medicine

## 2021-07-04 ENCOUNTER — Ambulatory Visit: Payer: Self-pay | Admitting: *Deleted

## 2021-07-04 DIAGNOSIS — R0981 Nasal congestion: Secondary | ICD-10-CM

## 2021-07-04 MED ORDER — AZELASTINE-FLUTICASONE 137-50 MCG/ACT NA SUSP
1.0000 | Freq: Two times a day (BID) | NASAL | 1 refills | Status: DC
Start: 2021-07-04 — End: 2021-12-11

## 2021-07-04 MED ORDER — IPRATROPIUM BROMIDE 0.06 % NA SOLN: 2.0000 | Freq: Four times a day (QID) | NASAL | 12 refills | Status: DC

## 2021-07-04 MED ORDER — SALINE SPRAY 0.65 % NA SOLN: 1.0000 | NASAL | 0 refills | Status: DC | PRN

## 2021-07-04 MED ORDER — CETIRIZINE HCL 10 MG PO TABS: 10.0000 mg | ORAL_TABLET | Freq: Every day | ORAL | 11 refills | Status: DC

## 2021-07-04 NOTE — Telephone Encounter (Signed)
Reason for Disposition  [1] Sinus congestion as part of a cold AND [2] present < 10 days  Answer Assessment - Initial Assessment Questions 1. LOCATION: "Where does it hurt?"      Face , forehead, eyes, throat 2. ONSET: "When did the sinus pain start?"  (e.g., hours, days)      2 days ago  3. SEVERITY: "How bad is the pain?"   (Scale 1-10; mild, moderate or severe)   - MILD (1-3): doesn't interfere with normal activities    - MODERATE (4-7): interferes with normal activities (e.g., work or school) or awakens from sleep   - SEVERE (8-10): excruciating pain and patient unable to do any normal activities        Moderate  4. RECURRENT SYMPTOM: "Have you ever had sinus problems before?" If Yes, ask: "When was the last time?" and "What happened that time?"      No  5. NASAL CONGESTION: "Is the nose blocked?" If Yes, ask: "Can you open it or must you breathe through your mouth?"     No , can breathe through nose 6. NASAL DISCHARGE: "Do you have discharge from your nose?" If so ask, "What color?"     Yes , green mucus 7. FEVER: "Do you have a fever?" If Yes, ask: "What is it, how was it measured, and when did it start?"      denies 8. OTHER SYMPTOMS: "Do you have any other symptoms?" (e.g., sore throat, cough, earache, difficulty breathing)     Sore throat, nasal congestion, eye pain 9. PREGNANCY: "Is there any chance you are pregnant?" "When was your last menstrual period?"     na  Protocols used: Sinus Pain or Congestion-A-AH

## 2021-07-04 NOTE — Telephone Encounter (Signed)
C/o sore throat, pain to talk and swallow food. Can swallow fluids without pain. C/o eye pain, face, nasal congestion, cheeks, headache laying down. Blowing nose and green mucus noted.Took at home covid test and negative. Denies cough, no fever, no chest pain or difficulty breathing. No available appt noted at this time. None available until 07/27/21. Patient reports recall on his "respirator' and has called to get a replacement but has not received one yet. Please advise if medication can be given due to sinus pain. Care advise given. Patient verbalized understanding of care advise and to call back or go to [facility] or ED if symptoms worsen.

## 2021-07-05 ENCOUNTER — Telehealth: Payer: Self-pay

## 2021-07-05 NOTE — Telephone Encounter (Signed)
Pt called in and was given the instructions from Tally Joe, NP regarding sinus measures to clear up the sinusitis.    Pt was agreeable to the plan.   I instructed him to call us back if he wasn't getting better after 3-4 days of doing these measures.   He verbalized understanding and thanked me for returning his call.

## 2021-07-05 NOTE — Telephone Encounter (Signed)
Lmtcb okay for [facility] to advise patient of Mrs. Paynes message below. KW

## 2021-07-05 NOTE — Progress Notes (Signed)
Chronic Care Management Pharmacy Assistant   Name: PHILO KURTZ  MRN: [id number] DOB: 10-18-1941  Reason for Encounter:Diabetes Disease State Call.   Recent office visits:  07/04/2021 Tally Joe FNP start Zyrtec 10 mg daily,Start Sodium Chloride 0.65% nasal Spray PRN, Start ipratropium 0.6% nasal spray 4 times daily, Start Azelastine-Fluticasone 137-50 MCG/ACT every 12 hours 06/25/2021 Dr.[name] MD (PCP) No medication Change Noted  Recent consult visits:  No recent [facility] visits:  None in previous 6 months  Medications: Outpatient Encounter Medications as of 07/05/2021  Medication Sig Note   allopurinol (ZYLOPRIM) 300 MG tablet TAKE 2 TABLETS(600 MG) BY MOUTH DAILY    apixaban (ELIQUIS) 5 MG TABS tablet Take 5 mg by mouth 2 (two) times daily. 01/23/2021: Prescribed by Hassell Done, NP   aspirin 81 MG EC tablet Take 81 mg by mouth daily.     atorvastatin (LIPITOR) 40 MG tablet TAKE 1 TABLET BY MOUTH EVERY DAY    Azelastine-Fluticasone 137-50 MCG/ACT SUSP Place 1 spray into the nose every 12 (twelve) hours.    Calcium Carbonate Antacid 600 MG chewable tablet Chew 1 tablet by mouth daily.    cetirizine (ZYRTEC) 10 MG tablet Take 1 tablet (10 mg total) by mouth daily.    Cyanocobalamin (B-12) 2500 MCG TABS Take 2,500 mcg by mouth daily.    GLUCOSAMINE HCL PO Take 1,500 mg by mouth daily.     glucose blood (ONETOUCH VERIO) test strip Use as instructed to check sugar daily    ipratropium (ATROVENT) 0.06 % nasal spray Place 2 sprays into both nostrils 4 (four) times daily.    losartan (COZAAR) 50 MG tablet TAKE 1 TABLET BY MOUTH DAILY    magnesium oxide (MAG-OX) 400 MG tablet Take 500 mg by mouth 2 (two) times daily.    metFORMIN (GLUCOPHAGE-XR) 500 MG 24 hr tablet Take 2 tablets (1,000 mg total) by mouth daily.    pantoprazole (PROTONIX) 40 MG tablet TAKE 1 TABLET BY MOUTH EVERY DAY    potassium chloride SA (KLOR-CON) 20 MEQ tablet Take 20 mEq by mouth daily.  01/23/2021: Prescribed by Hassell Done, NP    sodium chloride (OCEAN) 0.65 % SOLN nasal spray Place 1 spray into both nostrils as needed for congestion.    torsemide (DEMADEX) 20 MG tablet Take 20 mg by mouth 2 (two) times daily. 01/23/2021: Prescribed by Dr. Nehemiah Massed    triamcinolone ointment (KENALOG) 0.1 % Apply 1 application topically 2 (two) times daily. As directed    No facility-administered encounter medications on file as of 07/05/2021.    Care Gaps: Foot Exam Ophthalmology Exam Shingrix Vaccine COVID-19 Vaccine Influenza Vaccine Star Rating Drugs: Atorvastatin 40 mg last filled 06/14/2021 90 day supply at [facility] Metformin 500 mg last filled 06/16/2021 90 day supply at [facility]  Losartan 50 mg last filled 05/01/2021 90 day supply at [facility]. Medication Fill Gaps: None ID  Recent Relevant Labs: Lab Results  Component Value Date/Time   HGBA1C 6.3 (A) 06/25/2021 08:22 AM   HGBA1C 7.1 (A) 02/23/2021 01:53 PM   HGBA1C 8.2 (H) 10/04/2020 09:05 AM   HGBA1C 6.3 (H) 05/12/2019 09:14 AM    Kidney Function Lab Results  Component Value Date/Time   CREATININE 2.02 (H) 06/25/2021 08:41 AM   CREATININE 1.52 (H) 02/23/2021 02:11 PM   GFRNONAA 52 (L) 11/21/2020 03:05 PM   GFRAA 60 11/21/2020 03:05 PM    Current antihyperglycemic regimen:  Metformin XR 500 mg 2 tablets daily  Patient denies any issue/side effects from Metformin. What recent interventions/DTPs have been made to improve glycemic control:  None ID Have there been any recent hospitalizations or ED visits since last visit with CPP? No Patient denies hypoglycemic symptoms, including Pale, Sweaty, Shaky, Hungry, Nervous/irritable, and Vision changes Patient denies hyperglycemic symptoms, including blurry vision, excessive thirst, fatigue, polyuria, and weakness How often are you checking your blood sugar? Infrequently  What are your blood sugars ranging?  Fasting: N/A Before meals:  N/A After meals:  Patient states his blood sugar ranges around 135. Bedtime: N/A During the week, how often does your blood glucose drop below 70? Never Are you checking your feet daily/regularly?   Patient denies numbness,pain, or tingling sensation in his feet.  Adherence Review: Is the patient currently on a STATIN medication? Yes Is the patient currently on ACE/ARB medication? Yes Does the patient have >5 day gap between last estimated fill dates? No   Anderson Malta Clinical Production designer, theatre/television/film [phone]

## 2021-07-18 DIAGNOSIS — I5022 Chronic systolic (congestive) heart failure: Secondary | ICD-10-CM | POA: Diagnosis not present

## 2021-07-27 ENCOUNTER — Telehealth: Payer: Self-pay

## 2021-08-17 ENCOUNTER — Telehealth: Payer: Self-pay

## 2021-08-17 NOTE — Progress Notes (Signed)
Chronic Care Management Pharmacy Assistant   Name: Taylor [name]  MRN: [id number] DOB: [birth year]  Reason for Encounter:Diabetes Disease State Call.   Recent office visits:  No recent Office Visit  Recent consult visits:  No recent [facility] visits:  None in previous 6 months  Medications: Outpatient Encounter Medications as of 08/17/2021  Medication Sig Note   allopurinol (ZYLOPRIM) 300 MG tablet TAKE 2 TABLETS(600 MG) BY MOUTH DAILY    apixaban (ELIQUIS) 5 MG TABS tablet Take 5 mg by mouth 2 (two) times daily. 01/23/2021: Prescribed by Hassell Done, NP   aspirin 81 MG EC tablet Take 81 mg by mouth daily.     atorvastatin (LIPITOR) 40 MG tablet TAKE 1 TABLET BY MOUTH EVERY DAY    Azelastine-Fluticasone 137-50 MCG/ACT SUSP Place 1 spray into the nose every 12 (twelve) hours.    Calcium Carbonate Antacid 600 MG chewable tablet Chew 1 tablet by mouth daily.    cetirizine (ZYRTEC) 10 MG tablet Take 1 tablet (10 mg total) by mouth daily.    Cyanocobalamin (B-12) 2500 MCG TABS Take 2,500 mcg by mouth daily.    GLUCOSAMINE HCL PO Take 1,500 mg by mouth daily.     glucose blood (ONETOUCH VERIO) test strip Use as instructed to check sugar daily    ipratropium (ATROVENT) 0.06 % nasal spray Place 2 sprays into both nostrils 4 (four) times daily.    losartan (COZAAR) 50 MG tablet TAKE 1 TABLET BY MOUTH DAILY    magnesium oxide (MAG-OX) 400 MG tablet Take 500 mg by mouth 2 (two) times daily.    metFORMIN (GLUCOPHAGE-XR) 500 MG 24 hr tablet Take 2 tablets (1,000 mg total) by mouth daily.    pantoprazole (PROTONIX) 40 MG tablet TAKE 1 TABLET BY MOUTH EVERY DAY    potassium chloride SA (KLOR-CON) 20 MEQ tablet Take 20 mEq by mouth daily. 01/23/2021: Prescribed by Hassell Done, NP    sodium chloride (OCEAN) 0.65 % SOLN nasal spray Place 1 spray into both nostrils as needed for congestion.    torsemide (DEMADEX) 20 MG tablet Take 20 mg by mouth 2 (two) times daily. 01/23/2021:  Prescribed by Dr. Nehemiah Massed    triamcinolone ointment (KENALOG) 0.1 % Apply 1 application topically 2 (two) times daily. As directed    No facility-administered encounter medications on file as of 08/17/2021.    Care Gaps: Foot Exam Ophthalmology Exam Shingrix Vaccine COVID-19 Vaccine Influenza Vaccine Star Rating Drugs: Atorvastatin 40 mg last filled 06/14/2021 90 day supply at [facility] Metformin 500 mg last filled 06/16/2021 90 day supply at [facility]  Losartan 50 mg last filled 05/01/2021 90 day supply at [facility]. Medication Fill Gaps: None ID  Recent Relevant Labs: Lab Results  Component Value Date/Time   HGBA1C 6.3 (A) 06/25/2021 08:22 AM   HGBA1C 7.1 (A) 02/23/2021 01:53 PM   HGBA1C 8.2 (H) 10/04/2020 09:05 AM   HGBA1C 6.3 (H) 05/12/2019 09:14 AM    Kidney Function Lab Results  Component Value Date/Time   CREATININE 2.02 (H) 06/25/2021 08:41 AM   CREATININE 1.52 (H) 02/23/2021 02:11 PM   GFRNONAA 52 (L) 11/21/2020 03:05 PM   GFRAA 60 11/21/2020 03:05 PM    Current antihyperglycemic regimen:  Metformin XR 500 mg 2 tablets daily  What recent interventions/DTPs have been made to improve glycemic control:  None ID Have there been any recent hospitalizations or ED visits since last visit with CPP? No Patient denies hypoglycemic symptoms, including Pale, Sweaty,  Shaky, Hungry, Nervous/irritable, and Vision changes Patient reports hyperglycemic symptoms, including fatigue and weakness Patient states he feels fatigue and weak due to COVID.  How often are you checking your blood sugar?  Patient states he checks his blood sugar once in a while. What are your blood sugars ranging?  Patient states he does not remember his blood sugar readings.Inform patient he could start keeping a blood sugar log to provide in the future. Patient verbalized understanding. During the week, how often does your blood glucose drop below 70?  Patient does not  remember his blood sugar readings. Are you checking your feet daily/regularly?   Yes,Patient denies any numbness,pain or tingling sensation in his feet.  Adherence Review: Is the patient currently on a STATIN medication? Yes Is the patient currently on ACE/ARB medication? Yes Does the patient have >5 day gap between last estimated fill dates? No  Per Clinical Pharmacist, please reschedule patient telephone appointment on 10/05/2021.  Telephone follow up appointment with Care management team member scheduled for : 10/12/2021 at 11:00 am.   [place] Pharmacist Assistant [phone]

## 2021-08-22 ENCOUNTER — Other Ambulatory Visit: Payer: Self-pay | Admitting: Nephrology

## 2021-08-22 DIAGNOSIS — R82998 Other abnormal findings in urine: Secondary | ICD-10-CM

## 2021-08-22 DIAGNOSIS — E7800 Pure hypercholesterolemia, unspecified: Secondary | ICD-10-CM | POA: Insufficient documentation

## 2021-08-22 DIAGNOSIS — T82898A Other specified complication of vascular prosthetic devices, implants and grafts, initial encounter: Secondary | ICD-10-CM | POA: Diagnosis not present

## 2021-08-22 DIAGNOSIS — M1000 Idiopathic gout, unspecified site: Secondary | ICD-10-CM | POA: Diagnosis not present

## 2021-08-22 DIAGNOSIS — N1832 Chronic kidney disease, stage 3b: Secondary | ICD-10-CM

## 2021-08-22 DIAGNOSIS — E1122 Type 2 diabetes mellitus with diabetic chronic kidney disease: Secondary | ICD-10-CM | POA: Diagnosis not present

## 2021-08-22 DIAGNOSIS — Z95810 Presence of automatic (implantable) cardiac defibrillator: Secondary | ICD-10-CM | POA: Insufficient documentation

## 2021-08-22 DIAGNOSIS — I25119 Atherosclerotic heart disease of native coronary artery with unspecified angina pectoris: Secondary | ICD-10-CM | POA: Diagnosis not present

## 2021-08-22 DIAGNOSIS — I10 Essential (primary) hypertension: Secondary | ICD-10-CM | POA: Insufficient documentation

## 2021-08-22 DIAGNOSIS — I509 Heart failure, unspecified: Secondary | ICD-10-CM | POA: Diagnosis not present

## 2021-08-22 DIAGNOSIS — L408 Other psoriasis: Secondary | ICD-10-CM | POA: Diagnosis not present

## 2021-08-22 HISTORY — DX: Other specified complication of vascular prosthetic devices, implants and grafts, initial encounter: T82.898A

## 2021-08-30 ENCOUNTER — Other Ambulatory Visit: Payer: Self-pay

## 2021-08-30 ENCOUNTER — Ambulatory Visit
Admission: RE | Admit: 2021-08-30 | Discharge: 2021-08-30 | Disposition: A | Discharge status: Home / Self Care | Payer: Medicare Other | Source: Ambulatory Visit | Attending: Nephrology | Admitting: Nephrology

## 2021-08-30 DIAGNOSIS — E1122 Type 2 diabetes mellitus with diabetic chronic kidney disease: Secondary | ICD-10-CM | POA: Insufficient documentation

## 2021-08-30 DIAGNOSIS — N281 Cyst of kidney, acquired: Secondary | ICD-10-CM | POA: Diagnosis not present

## 2021-08-30 DIAGNOSIS — N1832 Chronic kidney disease, stage 3b: Secondary | ICD-10-CM | POA: Insufficient documentation

## 2021-08-30 DIAGNOSIS — R82998 Other abnormal findings in urine: Secondary | ICD-10-CM | POA: Insufficient documentation

## 2021-09-14 ENCOUNTER — Other Ambulatory Visit: Payer: Self-pay | Admitting: Family Medicine

## 2021-09-14 DIAGNOSIS — M1000 Idiopathic gout, unspecified site: Secondary | ICD-10-CM

## 2021-09-24 DIAGNOSIS — I509 Heart failure, unspecified: Secondary | ICD-10-CM | POA: Diagnosis not present

## 2021-09-24 DIAGNOSIS — N1832 Chronic kidney disease, stage 3b: Secondary | ICD-10-CM | POA: Diagnosis not present

## 2021-09-24 DIAGNOSIS — R82998 Other abnormal findings in urine: Secondary | ICD-10-CM | POA: Diagnosis not present

## 2021-09-24 DIAGNOSIS — I10 Essential (primary) hypertension: Secondary | ICD-10-CM | POA: Diagnosis not present

## 2021-09-24 DIAGNOSIS — E1122 Type 2 diabetes mellitus with diabetic chronic kidney disease: Secondary | ICD-10-CM | POA: Diagnosis not present

## 2021-09-27 DIAGNOSIS — E1122 Type 2 diabetes mellitus with diabetic chronic kidney disease: Secondary | ICD-10-CM | POA: Diagnosis not present

## 2021-09-27 DIAGNOSIS — I10 Essential (primary) hypertension: Secondary | ICD-10-CM | POA: Diagnosis not present

## 2021-09-27 DIAGNOSIS — U071 COVID-19: Secondary | ICD-10-CM

## 2021-09-27 DIAGNOSIS — N261 Atrophy of kidney (terminal): Secondary | ICD-10-CM | POA: Diagnosis not present

## 2021-09-27 DIAGNOSIS — T82898A Other specified complication of vascular prosthetic devices, implants and grafts, initial encounter: Secondary | ICD-10-CM | POA: Diagnosis not present

## 2021-09-27 DIAGNOSIS — L408 Other psoriasis: Secondary | ICD-10-CM | POA: Diagnosis not present

## 2021-09-27 DIAGNOSIS — N1832 Chronic kidney disease, stage 3b: Secondary | ICD-10-CM | POA: Diagnosis not present

## 2021-09-27 DIAGNOSIS — I25119 Atherosclerotic heart disease of native coronary artery with unspecified angina pectoris: Secondary | ICD-10-CM | POA: Diagnosis not present

## 2021-09-27 DIAGNOSIS — Z95810 Presence of automatic (implantable) cardiac defibrillator: Secondary | ICD-10-CM | POA: Diagnosis not present

## 2021-09-27 DIAGNOSIS — E7800 Pure hypercholesterolemia, unspecified: Secondary | ICD-10-CM | POA: Diagnosis not present

## 2021-09-27 DIAGNOSIS — R82998 Other abnormal findings in urine: Secondary | ICD-10-CM | POA: Diagnosis not present

## 2021-09-27 DIAGNOSIS — I509 Heart failure, unspecified: Secondary | ICD-10-CM | POA: Diagnosis not present

## 2021-09-27 DIAGNOSIS — M1000 Idiopathic gout, unspecified site: Secondary | ICD-10-CM | POA: Diagnosis not present

## 2021-09-27 HISTORY — DX: COVID-19: U07.1

## 2021-10-05 ENCOUNTER — Telehealth: Payer: Self-pay

## 2021-10-11 ENCOUNTER — Telehealth: Payer: Self-pay

## 2021-10-11 NOTE — Progress Notes (Signed)
APPOINTMENT REMINDER   RICO MASSAR was reminded to have all medications, supplements and any blood glucose and blood pressure readings available for review with [name], Pharm. D, at his telephone visit on 10/12/2021 at 11:00 am .  Patient confirm telephone appointment.  Eliquis application status:  Patient states he is not sure where he place the application for Eliquis.Patient is asking if there is a different medication that he can try that would be cheaper.Patient reports if there is not another medication, he would like for [place] to mail application  to him again and try for year 2023.Patient is aware he would have to meet a 3% out of pocket from his pharmacy first for one of [facility] requirements . Notified Clinical Pharmacist.   Bessie Ashby Pharmacist Assistant [phone]

## 2021-10-12 ENCOUNTER — Ambulatory Visit (INDEPENDENT_AMBULATORY_CARE_PROVIDER_SITE_OTHER): Payer: Medicare Other

## 2021-10-12 DIAGNOSIS — I5020 Unspecified systolic (congestive) heart failure: Secondary | ICD-10-CM

## 2021-10-12 DIAGNOSIS — I4891 Unspecified atrial fibrillation: Secondary | ICD-10-CM

## 2021-10-12 MED ORDER — CARVEDILOL 6.25 MG PO TABS: 6.2500 mg | ORAL_TABLET | Freq: Two times a day (BID) | ORAL | 1 refills | Status: DC

## 2021-10-12 MED ORDER — ALLOPURINOL 300 MG PO TABS: 150.0000 mg | ORAL_TABLET | Freq: Every day | ORAL | Status: DC

## 2021-10-12 NOTE — Progress Notes (Signed)
Chronic Care Management Pharmacy Note  10/24/2021 Name:  Taylor [name] MRN:  [id number] DOB:  December 13, 1940  Summary: Patient presents for CCM follow-up. Patient has only been taking Eliquis daily due to cost concerns.   Recommendations/Changes made from today's visit: -CHANGE Carvedilol to 6.25 mg twice daily -Resume Eliquis 5 mg twice daily. Will assist with PAP paperwork.   Plan: CPP follow-up in 4 months.   Subjective: Taylor [name] is an 80 y.o. year old male who is a primary patient of [name], [name], MD.  The CCM team was consulted for assistance with disease management and care coordination needs.    Engaged with patient by telephone for follow up visit in response to provider referral for pharmacy case management and/or care coordination services.   Consent to Services:  The patient was given information about Chronic Care Management services, agreed to services, and gave verbal consent prior to initiation of services.  Please see initial visit note for detailed documentation.   Patient Care Team: Birdie Sons, MD as PCP - General (Family Medicine) Corey Skains, MD as Consulting Physician (Cardiology) Birder Robson, MD as Referring Physician (Ophthalmology) Ottie Glazier, MD as Consulting Physician (Pulmonary Disease) Germaine Pomfret, [facility] (Pharmacist)  Recent office visits: 05/08/21: Patient presented to Dr. [name] Section for follow-up.  04/18/21: Video visit with Dr. [name] Section for leg abscess. Doxycycline 100 mg BID started.  02/23/21: Patient presented to Dr. [name] Section for follow-up. A1c improved to 7.1%. eGFr 47. Mag 1.4. Tizanidine started.  11/21/20: Patient presented to Dr. [name] Section for follow-up. Breo Elipta and furosemide stopped.   Recent consult visits: 09/27/21: Patient presented to Dr. Lanora Manis (Nephrology) for follow-up.  05/14/21: Patient preesnted to Dr. Nehemiah Massed (Cardiology) for follow-up.  03/01/21: Patient presented to Dr. Nehemiah Massed (Cardiology) for  follow-up. Carvedilol 6.25 mg twice daily  11/30/20: Patient presented to Hassell Done, NP (Cardiology) for follow-up. Eliquis 5 mg twice daily started for anticoagulation.   Hospital visits: None in previous 6 months   Objective:  Lab Results  Component Value Date   CREATININE 2.02 (H) 06/25/2021   BUN 29 (H) 06/25/2021   GFRNONAA 52 (L) 11/21/2020   GFRAA 60 11/21/2020   NA 143 06/25/2021   K 4.4 06/25/2021   CALCIUM 9.8 06/25/2021   CO2 25 06/25/2021   GLUCOSE 110 (H) 06/25/2021    Lab Results  Component Value Date/Time   HGBA1C 6.3 (A) 06/25/2021 08:22 AM   HGBA1C 7.1 (A) 02/23/2021 01:53 PM   HGBA1C 8.2 (H) 10/04/2020 09:05 AM   HGBA1C 6.3 (H) 05/12/2019 09:14 AM    Last diabetic Eye exam: No results found for: HMDIABEYEEXA  Last diabetic Foot exam: No results found for: HMDIABFOOTEX   Lab Results  Component Value Date   CHOL 107 06/25/2021   HDL 34 (L) 06/25/2021   LDLCALC 55 06/25/2021   TRIG 94 06/25/2021   CHOLHDL 3.1 06/25/2021    Hepatic Function Latest Ref Rng & Units 06/25/2021 02/23/2021 11/21/2020  Total Protein 6.0 - 8.5 g/dL 7.2 - -  Albumin 3.7 - 4.7 g/dL 4.0 4.3 3.8  AST 0 - 40 IU/L 22 - -  ALT 0 - 44 IU/L 12 - -  Alk Phosphatase 44 - 121 IU/L 132(H) - -  Total Bilirubin 0.0 - 1.2 mg/dL 1.0 - -  Bilirubin, Direct 0.0 - 0.2 mg/dL - - -    Lab Results  Component Value Date/Time   TSH 1.331 11/23/2019 04:58 AM   TSH 3.430 05/15/2015 09:08 AM  CBC Latest Ref Rng & Units 06/25/2021 08/11/2020 08/10/2020  WBC 3.4 - 10.8 x10E3/uL 7.2 9.4 11.5(H)  Hemoglobin 13.0 - 17.7 g/dL 14.9 13.3 13.8  Hematocrit 37.5 - 51.0 % 45.1 41.6 42.5  Platelets 150 - 450 x10E3/uL 124(L) 105(L) 122(L)    Lab Results  Component Value Date/Time   VD25OH 46.58 04/19/2020 04:16 PM   VD25OH 47.4 05/15/2015 09:08 AM    Clinical ASCVD: Yes  The ASCVD Risk score (Arnett DK, et al., 2019) failed to calculate for the following reasons:   The patient has a prior MI or stroke  diagnosis    Depression screen [facility] 2/9 06/25/2021 05/08/2021 06/29/2020  Decreased Interest 2 1 0  Down, Depressed, Hopeless 2 1 0  PHQ - 2 Score 4 2 0  Altered sleeping 2 0 -  Tired, decreased energy 2 1 -  Change in appetite 0 0 -  Feeling bad or failure about yourself  0 0 -  Trouble concentrating 1 0 -  Moving slowly or fidgety/restless 0 0 -  Suicidal thoughts 0 0 -  PHQ-9 Score 9 3 -  Difficult doing work/chores Not difficult at all Not difficult at all -    Social History   Tobacco Use  Smoking Status Former   Packs/day: 1.00   Types: Cigarettes   Quit date: 11/26/1999   Years since quitting: 21.9  Smokeless Tobacco Never   BP Readings from Last 3 Encounters:  06/25/21 126/76  05/08/21 113/72  02/23/21 124/70   Pulse Readings from Last 3 Encounters:  06/25/21 60  05/08/21 61  02/23/21 62   Wt Readings from Last 3 Encounters:  06/25/21 199 lb (90.3 kg)  05/08/21 195 lb (88.5 kg)  02/23/21 205 lb (93 kg)   BMI Readings from Last 3 Encounters:  06/25/21 28.55 kg/m  05/08/21 27.98 kg/m  02/23/21 29.41 kg/m    Assessment/Interventions: Review of patient past medical history, allergies, medications, health status, including review of consultants reports, laboratory and other test data, was performed as part of comprehensive evaluation and provision of chronic care management services.   SDOH:  (Social Determinants of Health) assessments and interventions performed: Yes   SDOH Screenings   Alcohol Screen: Low Risk    Last Alcohol Screening Score (AUDIT): 0  Depression (PHQ2-9): Medium Risk   PHQ-2 Score: 9  Financial Resource Strain: Low Risk    Difficulty of Paying Living Expenses: Not hard at all  Food Insecurity: No Food Insecurity   Worried About Charity fundraiser in the Last Year: Never true   Ran Out of Food in the Last Year: Never true  Housing: Low Risk    Last Housing Risk Score: 0  Physical Activity: Insufficiently Active   Days of Exercise  per Week: 1 day   Minutes of Exercise per Session: 120 min  Social Connections: Unknown   Frequency of Communication with Friends and Family: Twice a week   Frequency of Social Gatherings with Friends and Family: Twice a week   Attends Religious Services: More than 4 times per year   Active Member of [employer] or Organizations: Yes   Attends Music therapist: More than 4 times per year   Marital Status: Not on file  Stress: No Stress Concern Present   Feeling of Stress : Not at all  Tobacco Use: Medium Risk   Smoking Tobacco Use: Former   Smokeless Tobacco Use: Never   Passive Exposure: Not on file  Transportation Needs: No Transportation Needs  Lack of Transportation (Medical): No   Lack of Transportation (Non-Medical): No    CCM Care Plan  Allergies  Allergen Reactions   Amlodipine Besylate Swelling    Had a reaction when taking with colcrys    Crestor [Rosuvastatin]     Muscle cramps and pain   Rocephin [Ceftriaxone]     unknown    Medications Reviewed Today     Reviewed by Kizzie Furnish, CMA (Certified Medical Assistant) on 06/25/21 at [phone]  Med List Status: <None>   Medication Order Taking? Sig Documenting Provider Last Dose Status Informant  allopurinol (ZYLOPRIM) 300 MG tablet [id number] Yes TAKE 2 TABLETS(600 MG) BY MOUTH DAILY [name], [name], MD Taking Active   apixaban (ELIQUIS) 5 MG TABS tablet [id number] Yes Take 5 mg by mouth 2 (two) times daily. [provider] Taking Active            Med Note Michaelle Birks, [place] Jan 23, 2021  2:10 PM) Prescribed by Hassell Done, NP  aspirin 81 MG EC tablet [id number] Yes Take 81 mg by mouth daily.  [provider] Taking Active Self           Med Note Daron Offer A   Wed Jan 24, 2021  8:17 AM)    atorvastatin (LIPITOR) 40 MG tablet [id number] Yes TAKE 1 TABLET BY MOUTH EVERY DAY Birdie Sons, MD Taking Active   Calcium Carbonate Antacid 600 MG chewable tablet [id number] Yes Chew 1  tablet by mouth daily. [provider] Taking Active Self  Cyanocobalamin (B-12) 2500 MCG TABS [id number] Yes Take 2,500 mcg by mouth daily. [provider] Taking Active   GLUCOSAMINE HCL PO [id number] Yes Take 1,500 mg by mouth daily.  [provider] Taking Active Self  glucose blood (ONETOUCH VERIO) test strip [id number] Yes Use as instructed to check sugar daily Birdie Sons, MD Taking Active Self  losartan (COZAAR) 50 MG tablet [id number] Yes TAKE 1 TABLET BY MOUTH DAILY Birdie Sons, MD Taking Active Self  magnesium oxide (MAG-OX) 400 MG tablet [id number] Yes Take 500 mg by mouth 2 (two) times daily. [provider] Taking Active Self  metFORMIN (GLUCOPHAGE-XR) 500 MG 24 hr tablet [id number] Yes Take 2 tablets (1,000 mg total) by mouth daily. Birdie Sons, MD Taking Active   pantoprazole (PROTONIX) 40 MG tablet [id number] Yes TAKE 1 TABLET BY MOUTH EVERY DAY Birdie Sons, MD Taking Active Self  potassium chloride SA (KLOR-CON) 20 MEQ tablet [id number] Yes Take 20 mEq by mouth daily. [provider] Taking Active            Med Note Michaelle Birks, [place] Jan 23, 2021  2:10 PM) Prescribed by Hassell Done, NP   tiZANidine (ZANAFLEX) 2 MG tablet [id number]  Take 1-2 tablets (2-4 mg total) by mouth every 6 (six) hours as needed for muscle spasms. Birdie Sons, MD  Consider Medication Status and Discontinue (Completed Course)   torsemide (DEMADEX) 20 MG tablet [id number] Yes Take 20 mg by mouth 2 (two) times daily. [provider] Taking Active            Med Note Michaelle Birks, [place] Jan 23, 2021  2:10 PM) Prescribed by Dr. Nehemiah Massed   triamcinolone ointment (KENALOG) 0.1 % [id number] Yes Apply 1 application topically 2 (two) times daily. As directed Birdie Sons, MD Taking Active  Patient Active Problem List   Diagnosis Date Noted   Aortic atherosclerosis ([place]) 05/08/2021   Diabetes mellitus without  complication ([place]) 99/24/2683   Diarrhea 09/12/2020   Acute kidney injury superimposed on CKD ([place]) 08/10/2020   Hypomagnesemia 04/26/2020   Hypocalcemia 04/19/2020   Numbness and tingling in both hands 04/19/2020   Atrial fibrillation ([place]) 04/19/2020   Other specified interstitial pulmonary diseases ([place]) 12/06/2019   Abdominal pain    Suspected COVID-19 virus infection 11/22/2019   Pancreatitis 11/22/2019   CAP (community acquired pneumonia) 11/13/2019   Hyperuricemia 03/31/2019   Numbness 05/07/2018   Benign colonic polyp 03/24/2017   Tibialis anterior tenosynovitis 12/21/2015   Pleural nodule 07/27/2015   Psoriatic arthritis ([place]) 05/12/2015   Restrictive lung disease 05/12/2015   Barrett esophagus 03/30/2015   Ache in joint 03/30/2015   Bradycardia 03/30/2015   CAD (coronary artery disease) 03/30/2015   Cardiac defibrillator in place 03/30/2015   Chronic kidney disease (CKD), stage III (moderate) ([place]) 03/30/2015   Cluster headache syndrome 03/30/2015   Degeneration of lumbar or lumbosacral intervertebral disc 03/30/2015   Gout 03/30/2015   Personal history of malignant neoplasm of bladder 03/30/2015   Hypercholesteremia 03/30/2015   LBP (low back pain) 03/30/2015   Personal history of malignant melanoma of skin 03/30/2015   Muscle ache 03/30/2015   Arthritis of knee, degenerative 03/30/2015   Prediabetes 03/30/2015   Psoriasis 03/30/2015   Obstructive sleep apnea 03/30/2015   Chronic venous insufficiency 03/30/2015   History of abdominal aortic aneurysm (AAA) 06/24/2013   History of CVA (cerebrovascular accident) 06/24/2013   Cardiomyopathy, ischemic 06/24/2013   History of AAA (abdominal aortic aneurysm) repair 06/24/2013   Essential (primary) hypertension 08/17/2011   Arrhythmia, sinus node 08/17/2011   History of ventricular fibrillation 41/96/2229   Systolic CHF with reduced left ventricular function, NYHA class 3 ([place]) 02/26/2010   Disturbances of vision due  to cerebrovascular disease 04/27/2007   Cardiac pacemaker in situ 10/10/2006    Immunization History  Administered Date(s) Administered   Fluad Quad(high Dose 65+) 10/04/2020   Influenza Split 09/18/2011   Influenza, High Dose Seasonal PF 08/16/2014, 10/09/2016, 10/09/2017, 08/26/2018   Influenza,inj,Quad PF,6+ Mos 12/20/2015   PFIZER(Purple Top)SARS-COV-2 Vaccination 02/24/2020, 03/21/2020   Pneumococcal Conjugate-13 05/09/2014   Pneumococcal Polysaccharide-23 05/12/2015    Conditions to be addressed/monitored:  Hypertension, Hyperlipidemia, Diabetes, Atrial Fibrillation, Heart Failure, Coronary Artery Disease, Chronic Kidney Disease and Gout  Care Plan : General Pharmacy (Adult)  Updates made by Germaine Pomfret, RPH since 10/24/2021 12:00 AM     Problem: Hypertension, Hyperlipidemia, Diabetes, Atrial Fibrillation, Heart Failure, Coronary Artery Disease, Chronic Kidney Disease and Gout   Priority: High     Long-Range Goal: Patient-Specific Goal   Start Date: 01/23/2021  Expected End Date: 10/24/2022  This Visit's Progress: On track  Recent Progress: On track  Priority: High  Note:   Current Barriers:  Unable to independently afford treatment regimen Unable to achieve control of diabetes   Pharmacist Clinical Goal(s):  Over the next 90 days, patient will verbalize ability to afford treatment regimen achieve control of diabetes as evidenced by A1c less than 8% through collaboration with PharmD and provider.   Interventions: 1:1 collaboration with Birdie Sons, MD regarding development and update of comprehensive plan of care as evidenced by provider attestation and co-signature Inter-disciplinary care team collaboration (see longitudinal plan of care) Comprehensive medication review performed; medication list updated in electronic medical record  Hyperlipidemia: (LDL goal < 70) -History of CAD, history  of CVA  -Controlled -Current treatment: Atorvastatin 40 mg  daily  -Medications previously tried: NA  -Educated on Benefits of statin for ASCVD risk reduction; Importance of limiting foods high in cholesterol; -Recommended to continue current medication  Diabetes (A1c goal <8%) -Diagnosed Jan 2021 -Controlled -Current medications: Metformin XR 500 mg 2 tablets daily  -Medications previously tried: NA  -Current home glucose readings fasting glucose:  Post-Breakfast: 132,135 -Denies hypoglycemic/hyperglycemic symptoms  -Current meal patterns: cut out sodas. Replaced with twist water. 2 sandwiches with lunch down to 1.  -Continue current medications   Atrial Fibrillation (Goal: prevent stroke and major bleeding) -Controlled -CHADSVASC: 8 -Current treatment: Rate control: Carvedilol 12.5 mg daily  Anticoagulation: Eliquis 5 mg daily   -Medications previously tried: NA -Home BP and HR readings: NA  -Patient has only been taking Eliquis daily due to cost concerns.  -CHANGE Carvedilol to 6.25 mg twice daily -Resume Eliquis 5 mg twice daily. Will assist with PAP paperwork.   Heart Failure (Goal: control symptoms and prevent exacerbations) -Controlled Type: Systolic -NYHA Class: III (marked limitation of activity) -Ejection fraction: 35-40% (Date: Sep 2021) -Current treatment: Carvedilol 12.5 mg daily  Losartan 50 mg daily  Torsemide 20 mg twice daily  -Medications previously tried: Entresto, Amlodipine, valsartan, spironolactone, Carvedilol -Current home BP/HR readings: NA -weight has been stable, ranging from 187-195  -Educated on Importance of weighing daily; if you gain more than 3 pounds in one day or 5 pounds in one week, contact provider's office -CHANGE Carvedilol to 6.25 mg twice daily  Gout (Goal: prevent gout flares) -Controlled -Last Uric acid elevated above goal, but patient asymptomatic.  -Current treatment  Allopurinol 300 mg 1/2 tablets daily  -Medications previously tried: NA  -Last flare: 2 years  -Counseled on  diet and exercise extensively Recommended to continue current medication  GERD (Goal: Minimize symptoms of heartburn or reflux) -History of Barrett's Esophagus  -Controlled -Current treatment  Pantoprazole 40 mg daily  -Medications previously tried: NA  -Recommended to continue current medication  Chronic Kidney Disease Stage 3b  -All medications assessed for renal dosing and appropriateness in chronic kidney disease. -Could consider SGLT2 inhibitor in the future.  -Recommended to continue current medication  Patient Goals/Self-Care Activities Over the next 90 days, patient will:  - check glucose daily before breakfast, document, and provide at future appointments check blood pressure 2-3 times weekly , document, and provide at future appointments weigh daily, and contact provider if weight gain of greater than 3 pounds in one day  Follow Up Plan: Telephone follow up appointment with care management team member scheduled for:  11/07/2021         Medication Assistance: Application for Eliquis  medication assistance program. in process.  Anticipated assistance start date TBD.  See plan of care for additional detail.  Compliance/Adherence/Medication fill history: Care Gaps: Ophthalmology Exam  Shingrix, Covid, Pneumonia Vaccines   Star-Rating Drugs: Atorvastatin 40 mg: LF 03/21/21 for 90-DS Losartan 50 mg: LF 02/05/21 for 90-DS Metformin XR: LF 03/21/21 for 90-DS  Patient's preferred pharmacy is:  [id number] - [address], [address] AT [address] [place] [address] Phone: [phone] Fax: [phone]  Uses pill box? Yes Pt endorses 100% compliance  We discussed: Current pharmacy is preferred with insurance plan and patient is satisfied with pharmacy services Patient decided to: Continue current medication management strategy  Care Plan and Follow Up Patient Decision:  Patient agrees  to Care Plan and  Follow-up.  Plan: Telephone follow up appointment with care management team member scheduled for:  11/07/2021   [name], PharmD, [place], [place] Pharmacist Practitioner  [facility] [phone]

## 2021-10-15 ENCOUNTER — Telehealth: Payer: Self-pay | Admitting: Family Medicine

## 2021-10-15 MED ORDER — LOSARTAN POTASSIUM 50 MG PO TABS: 50.0000 mg | ORAL_TABLET | Freq: Every day | ORAL | 0 refills | Status: DC

## 2021-10-15 NOTE — Telephone Encounter (Signed)
[place] faxed refill request for the following medications:  losartan (COZAAR) 50 MG tablet   Please advise.

## 2021-10-16 ENCOUNTER — Ambulatory Visit: Payer: Medicare Other

## 2021-10-16 DIAGNOSIS — I4891 Unspecified atrial fibrillation: Secondary | ICD-10-CM

## 2021-10-16 NOTE — Progress Notes (Signed)
Taylor [name] Patient Assistance form for Eliquis completed by patient and faxed for review on 10/16/21  [name], PharmD, [place], [place] Pharmacist Practitioner  [facility] [phone]

## 2021-10-24 ENCOUNTER — Telehealth: Payer: Self-pay

## 2021-10-24 DIAGNOSIS — I5020 Unspecified systolic (congestive) heart failure: Secondary | ICD-10-CM

## 2021-10-24 DIAGNOSIS — I4891 Unspecified atrial fibrillation: Secondary | ICD-10-CM

## 2021-10-24 NOTE — Patient Instructions (Signed)
Visit Information It was great speaking with you today!  Please let me know if you have any questions about our visit.   Goals Addressed             This Visit's Progress    Monitor and Manage My Blood Sugar-Diabetes Type 2       Timeframe:  Long-Range Goal Priority:  High Start Date:  01/23/2021                           Expected End Date: 07/26/2022                  Follow Up within 90 days   -Check blood sugar once daily, before breakfast  - check blood sugar if I feel it is too high or too low - enter blood sugar readings and medication or insulin into daily log    Why is this important?   Checking your blood sugar at home helps to keep it from getting very high or very low.  Writing the results in a diary or log helps the doctor know how to care for you.  Your blood sugar log should have the time, date and the results.  Also, write down the amount of insulin or other medicine that you take.  Other information, like what you ate, exercise done and how you were feeling, will also be helpful.     Notes:         Patient Care Plan: General Pharmacy (Adult)     Problem Identified: Hypertension, Hyperlipidemia, Diabetes, Atrial Fibrillation, Heart Failure, Coronary Artery Disease, Chronic Kidney Disease and Gout   Priority: High     Long-Range Goal: Patient-Specific Goal   Start Date: 01/23/2021  Expected End Date: 10/24/2022  This Visit's Progress: On track  Recent Progress: On track  Priority: High  Note:   Current Barriers:  Unable to independently afford treatment regimen Unable to achieve control of diabetes   Pharmacist Clinical Goal(s):  Over the next 90 days, patient will verbalize ability to afford treatment regimen achieve control of diabetes as evidenced by A1c less than 8% through collaboration with PharmD and provider.   Interventions: 1:1 collaboration with Birdie Sons, MD regarding development and update of comprehensive plan of care as evidenced  by provider attestation and co-signature Inter-disciplinary care team collaboration (see longitudinal plan of care) Comprehensive medication review performed; medication list updated in electronic medical record  Hyperlipidemia: (LDL goal < 70) -History of CAD, history of CVA  -Controlled -Current treatment: Atorvastatin 40 mg daily  -Medications previously tried: NA  -Educated on Benefits of statin for ASCVD risk reduction; Importance of limiting foods high in cholesterol; -Recommended to continue current medication  Diabetes (A1c goal <8%) -Diagnosed Jan 2021 -Controlled -Current medications: Metformin XR 500 mg 2 tablets daily  -Medications previously tried: NA  -Current home glucose readings fasting glucose:  Post-Breakfast: 132,135 -Denies hypoglycemic/hyperglycemic symptoms  -Current meal patterns: cut out sodas. Replaced with twist water. 2 sandwiches with lunch down to 1.  -Continue current medications   Atrial Fibrillation (Goal: prevent stroke and major bleeding) -Controlled -CHADSVASC: 8 -Current treatment: Rate control: Carvedilol 12.5 mg daily  Anticoagulation: Eliquis 5 mg daily   -Medications previously tried: NA -Home BP and HR readings: NA  -Patient has only been taking Eliquis daily due to cost concerns.  -CHANGE Carvedilol to 6.25 mg twice daily -Resume Eliquis 5 mg twice daily. Will assist with PAP paperwork.  Heart Failure (Goal: control symptoms and prevent exacerbations) -Controlled Type: Systolic -NYHA Class: III (marked limitation of activity) -Ejection fraction: 35-40% (Date: Sep 2021) -Current treatment: Carvedilol 12.5 mg daily  Losartan 50 mg daily  Torsemide 20 mg twice daily  -Medications previously tried: Entresto, Amlodipine, valsartan, spironolactone, Carvedilol -Current home BP/HR readings: NA -weight has been stable, ranging from 187-195  -Educated on Importance of weighing daily; if you gain more than 3 pounds in one day or 5  pounds in one week, contact provider's office -CHANGE Carvedilol to 6.25 mg twice daily  Gout (Goal: prevent gout flares) -Controlled -Last Uric acid elevated above goal, but patient asymptomatic.  -Current treatment  Allopurinol 300 mg 1/2 tablets daily  -Medications previously tried: NA  -Last flare: 2 years  -Counseled on diet and exercise extensively Recommended to continue current medication  GERD (Goal: Minimize symptoms of heartburn or reflux) -History of Barrett's Esophagus  -Controlled -Current treatment  Pantoprazole 40 mg daily  -Medications previously tried: NA  -Recommended to continue current medication  Chronic Kidney Disease Stage 3b  -All medications assessed for renal dosing and appropriateness in chronic kidney disease. -Could consider SGLT2 inhibitor in the future.  -Recommended to continue current medication  Patient Goals/Self-Care Activities Over the next 90 days, patient will:  - check glucose daily before breakfast, document, and provide at future appointments check blood pressure 2-3 times weekly , document, and provide at future appointments weigh daily, and contact provider if weight gain of greater than 3 pounds in one day  Follow Up Plan: Telephone follow up appointment with care management team member scheduled for:  11/07/2021     Patient agreed to services and verbal consent obtained.   Patient verbalizes understanding of instructions provided today and agrees to view in [place].   [name], PharmD, Para [place], CPP  Clinical Pharmacist Practitioner  [facility] [phone]'

## 2021-10-24 NOTE — Progress Notes (Signed)
    Chronic Care Management Pharmacy Assistant   Name: Taylor [name]  MRN: [id number] DOB: May 14, 1941  Reason for Encounter: Medication Review/Patient assistance update on [facility] visits:  None in previous 6 months  Medications: Outpatient Encounter Medications as of 10/24/2021  Medication Sig Note   [START ON 09/25/2022] allopurinol (ZYLOPRIM) 300 MG tablet Take 0.5 tablets (150 mg total) by mouth daily.    apixaban (ELIQUIS) 5 MG TABS tablet Take 5 mg by mouth 2 (two) times daily. 01/23/2021: Prescribed by Hassell Done, NP   aspirin 81 MG EC tablet Take 81 mg by mouth daily.     atorvastatin (LIPITOR) 40 MG tablet TAKE 1 TABLET BY MOUTH EVERY DAY    Azelastine-Fluticasone 137-50 MCG/ACT SUSP Place 1 spray into the nose every 12 (twelve) hours.    Calcium Carbonate Antacid 600 MG chewable tablet Chew 1 tablet by mouth daily.    carvedilol (COREG) 6.25 MG tablet Take 1 tablet (6.25 mg total) by mouth 2 (two) times daily with a meal.    cetirizine (ZYRTEC) 10 MG tablet Take 1 tablet (10 mg total) by mouth daily.    Cyanocobalamin (B-12) 2500 MCG TABS Take 2,500 mcg by mouth daily.    GLUCOSAMINE HCL PO Take 1,500 mg by mouth daily.     glucose blood (ONETOUCH VERIO) test strip Use as instructed to check sugar daily    ipratropium (ATROVENT) 0.06 % nasal spray Place 2 sprays into both nostrils 4 (four) times daily.    losartan (COZAAR) 50 MG tablet Take 1 tablet (50 mg total) by mouth daily.    magnesium oxide (MAG-OX) 400 MG tablet Take 500 mg by mouth 2 (two) times daily.    metFORMIN (GLUCOPHAGE-XR) 500 MG 24 hr tablet Take 2 tablets (1,000 mg total) by mouth daily.    pantoprazole (PROTONIX) 40 MG tablet TAKE 1 TABLET BY MOUTH EVERY DAY    potassium chloride SA (KLOR-CON) 20 MEQ tablet Take 20 mEq by mouth daily. 01/23/2021: Prescribed by Hassell Done, NP    sodium chloride (OCEAN) 0.65 % SOLN nasal spray Place 1 spray into both nostrils as needed for congestion.    torsemide  (DEMADEX) 20 MG tablet Take 20 mg by mouth 2 (two) times daily. 01/23/2021: Prescribed by Dr. Nehemiah Massed    triamcinolone ointment (KENALOG) 0.1 % Apply 1 application topically 2 (two) times daily. As directed    No facility-administered encounter medications on file as of 10/24/2021.   Eliquis application status:  I reach out to [employer] to get a update on patient assistance application for Eliquis.  Per [employer], patient application will be process on 10/25/2021.Patient will be notified if he is approved or denied, or I can call back in January 2023 to get a update if the patient has not heard anything from them.Notified Clinical pharmacist.   [place] Clinical Pharmacist Assistant [phone]

## 2021-11-05 ENCOUNTER — Telehealth: Payer: Self-pay

## 2021-11-05 DIAGNOSIS — I25810 Atherosclerosis of coronary artery bypass graft(s) without angina pectoris: Secondary | ICD-10-CM | POA: Diagnosis not present

## 2021-11-05 DIAGNOSIS — I7143 Infrarenal abdominal aortic aneurysm, without rupture: Secondary | ICD-10-CM | POA: Diagnosis not present

## 2021-11-05 DIAGNOSIS — I6523 Occlusion and stenosis of bilateral carotid arteries: Secondary | ICD-10-CM | POA: Diagnosis not present

## 2021-11-05 DIAGNOSIS — I5022 Chronic systolic (congestive) heart failure: Secondary | ICD-10-CM | POA: Diagnosis not present

## 2021-11-05 DIAGNOSIS — I10 Essential (primary) hypertension: Secondary | ICD-10-CM | POA: Diagnosis not present

## 2021-11-05 NOTE — Progress Notes (Signed)
Chronic Care Management Pharmacy Assistant   Name: Taylor [name]  MRN: [id number] DOB: 1941/08/06  Reason for Encounter:Hypertension Disease State Call/ Patient assistance application for Eliquis update.   Recent office visits:  No recent office Visits  Recent consult visits:  No recent consult Visit  Hospital visits:  None in previous 6 months  Medications: Outpatient Encounter Medications as of 11/05/2021  Medication Sig Note   [START ON 09/25/2022] allopurinol (ZYLOPRIM) 300 MG tablet Take 0.5 tablets (150 mg total) by mouth daily.    apixaban (ELIQUIS) 5 MG TABS tablet Take 5 mg by mouth 2 (two) times daily. 01/23/2021: Prescribed by Hassell Done, NP   aspirin 81 MG EC tablet Take 81 mg by mouth daily.     atorvastatin (LIPITOR) 40 MG tablet TAKE 1 TABLET BY MOUTH EVERY DAY    Azelastine-Fluticasone 137-50 MCG/ACT SUSP Place 1 spray into the nose every 12 (twelve) hours.    Calcium Carbonate Antacid 600 MG chewable tablet Chew 1 tablet by mouth daily.    carvedilol (COREG) 6.25 MG tablet Take 1 tablet (6.25 mg total) by mouth 2 (two) times daily with a meal.    cetirizine (ZYRTEC) 10 MG tablet Take 1 tablet (10 mg total) by mouth daily.    Cyanocobalamin (B-12) 2500 MCG TABS Take 2,500 mcg by mouth daily.    GLUCOSAMINE HCL PO Take 1,500 mg by mouth daily.     glucose blood (ONETOUCH VERIO) test strip Use as instructed to check sugar daily    ipratropium (ATROVENT) 0.06 % nasal spray Place 2 sprays into both nostrils 4 (four) times daily.    losartan (COZAAR) 50 MG tablet Take 1 tablet (50 mg total) by mouth daily.    magnesium oxide (MAG-OX) 400 MG tablet Take 500 mg by mouth 2 (two) times daily.    metFORMIN (GLUCOPHAGE-XR) 500 MG 24 hr tablet Take 2 tablets (1,000 mg total) by mouth daily.    pantoprazole (PROTONIX) 40 MG tablet TAKE 1 TABLET BY MOUTH EVERY DAY    potassium chloride SA (KLOR-CON) 20 MEQ tablet Take 20 mEq by mouth daily. 01/23/2021: Prescribed by Hassell Done, NP    sodium chloride (OCEAN) 0.65 % SOLN nasal spray Place 1 spray into both nostrils as needed for congestion.    torsemide (DEMADEX) 20 MG tablet Take 20 mg by mouth 2 (two) times daily. 01/23/2021: Prescribed by Dr. Nehemiah Massed    triamcinolone ointment (KENALOG) 0.1 % Apply 1 application topically 2 (two) times daily. As directed    No facility-administered encounter medications on file as of 11/05/2021.    Care Gaps: Foot Exam Ophthalmology Exam Shingrix Vaccine COVID-19 Vaccine Influenza Vaccine  Star Rating Drugs: Atorvastatin 40 mg last filled 09/19/2021 90 day supply at [facility] Metformin 500 mg last filled 09/18/2021 90 day supply at [facility]  Losartan 50 mg last filled 10/15/2021 90 day supply at [facility].  Medication Fill Gaps: None ID  Reviewed chart prior to disease state call. Spoke with patient regarding BP  Recent Office Vitals: BP Readings from Last 3 Encounters:  06/25/21 126/76  05/08/21 113/72  02/23/21 124/70   Pulse Readings from Last 3 Encounters:  06/25/21 60  05/08/21 61  02/23/21 62    Wt Readings from Last 3 Encounters:  06/25/21 199 lb (90.3 kg)  05/08/21 195 lb (88.5 kg)  02/23/21 205 lb (93 kg)     Kidney Function Lab Results  Component Value Date/Time   CREATININE 2.02 (H) 06/25/2021 08:41  AM   CREATININE 1.52 (H) 02/23/2021 02:11 PM   GFRNONAA 52 (L) 11/21/2020 03:05 PM   GFRAA 60 11/21/2020 03:05 PM    BMP Latest Ref Rng & Units 06/25/2021 02/23/2021 11/21/2020  Glucose 65 - 99 mg/dL 110(H) 111(H) 165(H)  BUN 8 - 27 mg/dL 29(H) 29(H) 19  Creatinine 0.76 - 1.27 mg/dL 2.02(H) 1.52(H) 1.31(H)  BUN/Creat Ratio 10 - 24 14 19 15   Sodium 134 - 144 mmol/L 143 143 144  Potassium 3.5 - 5.2 mmol/L 4.4 4.5 4.1  Chloride 96 - 106 mmol/L 102 100 103  CO2 20 - 29 mmol/L 25 25 26   Calcium 8.6 - 10.2 mg/dL 9.8 9.7 9.1    Current antihypertensive regimen:  Carvedilol 6.25 mg daily  Losartan 50 mg daily   Torsemide 20 mg twice daily   Patient denies any issue/side effects from Carvedilol, Losartan, or Torsemide. How often are you checking your Blood Pressure? when feeling symptomatic Current home BP readings:  Patient states the only time he will check his blood pressure is when he is not feel well. Patient states he has been doing "good, and my blood pressure has been normal". What recent interventions/DTPs have been made by any provider to improve Blood Pressure control since last CPP Visit: None ID Any recent hospitalizations or ED visits since last visit with CPP? No What diet changes have been made to improve Blood Pressure Control?  Patient denies any changes with his blood pressure. What exercise is being done to improve your Blood Pressure Control?  Patient denies any changes with his exercise.  Adherence Review: Is the patient currently on ACE/ARB medication? Yes Does the patient have >5 day gap between last estimated fill dates? No  Patient assistance application update for Eliquis:  I reach out to [employer] to get a update on patient application assistance for Eliquis.Per Roosvelt Harps, processing application for year 4196 does not start until January 1st. Patient will receive information from them within the first to second week of January regarding if he is approve/denied or if they need any to process his application.  Informed patient of [employer] response.Patient states he received a letter informing him he was approved until December 2229, and application for him to complete for renewal for year 2023.Patient confirm receiving 90 day supply of Eliquis.Patient is asking if he needs to complete the application, and bring it to his PCP office again or disregard it.Inform patient to go ahead and complete the application and bring it to his PCP office for the clinical pharmacist to fax it over just incase to avoid any delays.I also inform the patient I would reach out to  [facility] the second week of January to confirm his approval.Patient verbalized understanding.  Telephone follow up appointment with Care management team member scheduled for : 01/21/2022 at 3:45 pm.  [place] Pharmacist Assistant [phone]

## 2021-11-06 DIAGNOSIS — I5022 Chronic systolic (congestive) heart failure: Secondary | ICD-10-CM | POA: Diagnosis not present

## 2021-11-07 ENCOUNTER — Encounter: Payer: Medicare Other | Admitting: Family Medicine

## 2021-11-07 NOTE — Progress Notes (Deleted)
Annual Wellness Visit     Patient: Taylor [name], Male    DOB: 06/24/1941, 80 y.o.   MRN: [id number] Visit Date: 11/07/2021  Today's Provider: Lelon Huh, MD   No chief complaint on file.  Subjective    Taylor [name] is a 80 y.o. male who presents today for his Annual Wellness Visit. He reports consuming a {diet types:17450} diet. {Exercise:19826} He generally feels {well/fairly well/poorly:18703}. He reports sleeping {well/fairly well/poorly:18703}. He {does/does not:200015} have additional problems to discuss today.   HPI Hypertension, follow-up  BP Readings from Last 3 Encounters:  06/25/21 126/76  05/08/21 113/72  02/23/21 124/70   Wt Readings from Last 3 Encounters:  06/25/21 199 lb (90.3 kg)  05/08/21 195 lb (88.5 kg)  02/23/21 205 lb (93 kg)     He was last seen for hypertension 4 months ago.  BP at that visit was 126/76. Management since that visit includes continuing same medication.  He reports {excellent/good/fair/poor:19665} compliance with treatment. He {is/is not:9024} having side effects. {document side effects if present:1} He is following a {diet:21022986} diet. He {is/is not:9024} exercising. He {does/does not:200015} smoke.  Use of agents associated with hypertension: {bp agents assoc with hypertension:511::"none"}.   Outside blood pressures are {***enter patient reported home BP readings, or 'not being checked':1}. Symptoms: {Yes/No:20286} chest pain {Yes/No:20286} chest pressure  {Yes/No:20286} palpitations {Yes/No:20286} syncope  {Yes/No:20286} dyspnea {Yes/No:20286} orthopnea  {Yes/No:20286} paroxysmal nocturnal dyspnea {Yes/No:20286} lower extremity edema   Pertinent labs: Lab Results  Component Value Date   CHOL 107 06/25/2021   HDL 34 (L) 06/25/2021   LDLCALC 55 06/25/2021   TRIG 94 06/25/2021   CHOLHDL 3.1 06/25/2021   Lab Results  Component Value Date   NA 143 06/25/2021   K 4.4 06/25/2021   CREATININE 2.02 (H) 06/25/2021    EGFR 33 (L) 06/25/2021   GLUCOSE 110 (H) 06/25/2021   TSH 1.331 11/23/2019     The ASCVD Risk score (Arnett DK, et al., 2019) failed to calculate for the following reasons:   The patient has a prior MI or stroke diagnosis   ---------------------------------------------------------------------------------------------------   Diabetes Mellitus Type II, Follow-up  Lab Results  Component Value Date   HGBA1C 6.3 (A) 06/25/2021   HGBA1C 7.1 (A) 02/23/2021   HGBA1C 8.2 (H) 10/04/2020   Wt Readings from Last 3 Encounters:  06/25/21 199 lb (90.3 kg)  05/08/21 195 lb (88.5 kg)  02/23/21 205 lb (93 kg)   Last seen for diabetes 4 months ago.  Management since then includes continuing same medication. He reports {excellent/good/fair/poor:19665} compliance with treatment. He {is/is not:21021397} having side effects. {document side effects if present:1} Symptoms: {Yes/No:20286} fatigue {Yes/No:20286} foot ulcerations  {Yes/No:20286} appetite changes {Yes/No:20286} nausea  {Yes/No:20286} paresthesia of the feet  {Yes/No:20286} polydipsia  {Yes/No:20286} polyuria {Yes/No:20286} visual disturbances   {Yes/No:20286} vomiting     Home blood sugar records: {diabetes glucometry results:16657}  Episodes of hypoglycemia? {Yes/No:20286} {enter symptoms and frequency of symptoms if yes:1}   Current insulin regiment: {enter 'none' or type of insulin and number of units taken with each dose of each insulin formulation that the patient is taking:1} Most Recent Eye Exam: *** {Current exercise:16438:::1} {Current diet habits:16563:::1}  Pertinent Labs: Lab Results  Component Value Date   CHOL 107 06/25/2021   HDL 34 (L) 06/25/2021   LDLCALC 55 06/25/2021   TRIG 94 06/25/2021   CHOLHDL 3.1 06/25/2021   Lab Results  Component Value Date   NA 143 06/25/2021   K 4.4  06/25/2021   CREATININE 2.02 (H) 06/25/2021   EGFR 33 (L) 06/25/2021      ---------------------------------------------------------------------------------------------------    Medications: Outpatient Medications Prior to Visit  Medication Sig   [START ON 09/25/2022] allopurinol (ZYLOPRIM) 300 MG tablet Take 0.5 tablets (150 mg total) by mouth daily.   apixaban (ELIQUIS) 5 MG TABS tablet Take 5 mg by mouth 2 (two) times daily.   aspirin 81 MG EC tablet Take 81 mg by mouth daily.    atorvastatin (LIPITOR) 40 MG tablet TAKE 1 TABLET BY MOUTH EVERY DAY   Azelastine-Fluticasone 137-50 MCG/ACT SUSP Place 1 spray into the nose every 12 (twelve) hours.   Calcium Carbonate Antacid 600 MG chewable tablet Chew 1 tablet by mouth daily.   carvedilol (COREG) 6.25 MG tablet Take 1 tablet (6.25 mg total) by mouth 2 (two) times daily with a meal.   cetirizine (ZYRTEC) 10 MG tablet Take 1 tablet (10 mg total) by mouth daily.   Cyanocobalamin (B-12) 2500 MCG TABS Take 2,500 mcg by mouth daily.   GLUCOSAMINE HCL PO Take 1,500 mg by mouth daily.    glucose blood (ONETOUCH VERIO) test strip Use as instructed to check sugar daily   ipratropium (ATROVENT) 0.06 % nasal spray Place 2 sprays into both nostrils 4 (four) times daily.   losartan (COZAAR) 50 MG tablet Take 1 tablet (50 mg total) by mouth daily.   magnesium oxide (MAG-OX) 400 MG tablet Take 500 mg by mouth 2 (two) times daily.   metFORMIN (GLUCOPHAGE-XR) 500 MG 24 hr tablet Take 2 tablets (1,000 mg total) by mouth daily.   pantoprazole (PROTONIX) 40 MG tablet TAKE 1 TABLET BY MOUTH EVERY DAY   potassium chloride SA (KLOR-CON) 20 MEQ tablet Take 20 mEq by mouth daily.   sodium chloride (OCEAN) 0.65 % SOLN nasal spray Place 1 spray into both nostrils as needed for congestion.   torsemide (DEMADEX) 20 MG tablet Take 20 mg by mouth 2 (two) times daily.   triamcinolone ointment (KENALOG) 0.1 % Apply 1 application topically 2 (two) times daily. As directed   No facility-administered medications prior to visit.    Allergies   Allergen Reactions   Amlodipine Besylate Swelling    Had a reaction when taking with colcrys    Crestor [Rosuvastatin]     Muscle cramps and pain   Rocephin [Ceftriaxone]     unknown    Patient Care Team: Birdie Sons, MD as PCP - General (Family Medicine) Corey Skains, MD as Consulting Physician (Cardiology) Birder Robson, MD as Referring Physician (Ophthalmology) Ottie Glazier, MD as Consulting Physician (Pulmonary Disease) Germaine Pomfret, [facility] (Pharmacist)  Review of Systems  {Labs   Heme   Chem   Endocrine   Serology   Results Review (optional):23779}    Objective    Vitals: There were no vitals taken for this visit. {Show previous vital signs (optional):23777}  Physical Exam ***  Most recent functional status assessment: In your present state of health, do you have any difficulty performing the following activities: 06/25/2021  Hearing? Y  Vision? Y  Difficulty concentrating or making decisions? N  Walking or climbing stairs? N  Dressing or bathing? N  Doing errands, shopping? N  Some recent data might be hidden   Most recent fall risk assessment: Fall Risk  05/08/2021  Falls in the past year? 0  Number falls in past yr: 0  Injury with Fall? 0  Risk for fall due to : No Fall Risks  Follow  up Falls evaluation completed    Most recent depression screenings: PHQ 2/9 Scores 06/25/2021 05/08/2021  PHQ - 2 Score 4 2  PHQ- 9 Score 9 3   Most recent cognitive screening: 6CIT Screen 03/31/2018  What Year? 0 points  What month? 0 points  What time? 0 points  Count back from 20 0 points  Months in reverse 0 points  Repeat phrase 2 points  Total Score 2   Most recent Audit-C alcohol use screening Alcohol Use Disorder Test (AUDIT) 06/25/2021  1. How often do you have a drink containing alcohol? 0  2. How many drinks containing alcohol do you have on a typical day when you are drinking? 0  3. How often do you have six or more drinks on one occasion?  0  AUDIT-C Score 0  Alcohol Brief Interventions/Follow-up -   A score of 3 or more in women, and 4 or more in men indicates increased risk for alcohol abuse, EXCEPT if all of the points are from question 1   No results found for any visits on 11/07/21.  Assessment & Plan     Annual wellness visit done today including the all of the following: Reviewed patient's Family Medical History Reviewed and updated list of patient's medical providers Assessment of cognitive impairment was done Assessed patient's functional ability Established a written schedule for health screening [place] Completed and Reviewed  Exercise Activities and Dietary recommendations  Goals      DIET - REDUCE PORTION SIZE     Recommend decreasing portion sizes by half and eating 3 small meals a day with two healthy snacks in between.      Monitor and Manage My Blood Sugar-Diabetes Type 2     Timeframe:  Long-Range Goal Priority:  High Start Date:  01/23/2021                           Expected End Date: 07/26/2022                  Follow Up within 90 days   -Check blood sugar once daily, before breakfast  - check blood sugar if I feel it is too high or too low - enter blood sugar readings and medication or insulin into daily log    Why is this important?   Checking your blood sugar at home helps to keep it from getting very high or very low.  Writing the results in a diary or log helps the doctor know how to care for you.  Your blood sugar log should have the time, date and the results.  Also, write down the amount of insulin or other medicine that you take.  Other information, like what you ate, exercise done and how you were feeling, will also be helpful.     Notes:         Immunization History  Administered Date(s) Administered   Fluad Quad(high Dose 65+) 10/04/2020   Influenza Split 09/18/2011   Influenza, High Dose Seasonal PF 08/16/2014, 10/09/2016, 10/09/2017, 08/26/2018    Influenza,inj,Quad PF,6+ Mos 12/20/2015   PFIZER(Purple Top)SARS-COV-2 Vaccination 02/24/2020, 03/21/2020   Pneumococcal Conjugate-13 05/09/2014   Pneumococcal Polysaccharide-23 05/12/2015    Health Maintenance  Topic Date Due   FOOT EXAM  Never done   OPHTHALMOLOGY EXAM  Never done   Zoster Vaccines- Shingrix (1 of 2) Never done   COVID-19 Vaccine (3 - Booster for Pfizer series) 05/16/2020  INFLUENZA VACCINE  06/25/2021   TETANUS/TDAP  12/06/2023 (Originally 10/06/1960)   HEMOGLOBIN A1C  12/26/2021   Pneumonia Vaccine 21+ Years old  Completed   HPV VACCINES  Aged Out     Discussed health benefits of physical activity, and encouraged him to engage in regular exercise appropriate for his age and condition.    ***  No follow-ups on file.     {provider attestation***:1}   Lelon Huh, MD  [facility] [phone] (phone) [phone] (fax)  [place]

## 2021-11-20 ENCOUNTER — Ambulatory Visit: Payer: Self-pay

## 2021-11-20 NOTE — Telephone Encounter (Signed)
°  Chief Complaint: COVID Symptoms: Tired, Sore throat, and fever. Chest pain when he coughs. Frequency: continous Pertinent Negatives: Patient denies SOB,  Disposition: [] ED /[] Urgent Care (no appt availability in office) / [] Appointment(In office/virtual)/ [x]  [facility]/ [] Home Care/ [] Refused Recommended Disposition   Additional Notes: Pt sounds good and would have reccommended home care, except that pt has COVID 2 years ago and was hospitalized for same.   Pt would like a virtual appointment and or medications called in.  No appointments available.  Pt also states that he has chest pain when he coughs, but not otherwise. Pt is sure it is not heart related and declined suggestion of ED.   Reason for Disposition  [1] COVID-19 infection suspected by caller or triager AND [2] mild symptoms (cough, fever, or others) AND [3] negative COVID-19 rapid test  Answer Assessment - Initial Assessment Questions 1. COVID-19 DIAGNOSIS: "Who made your COVID-19 diagnosis?" "Was it confirmed by a positive lab test or self-test?" If not diagnosed by a doctor (or NP/PA), ask "Are there lots of cases (community spread) where you live?" Note: See public health department website, if unsure.     Home test 2. COVID-19 EXPOSURE: "Was there any known exposure to COVID before the symptoms began?" CDC Definition of close contact: within 6 feet (2 meters) for a total of 15 minutes or more over a 24-hour period.      no 3. ONSET: "When did the COVID-19 symptoms start?"      Christmas eve 4. WORST SYMPTOM: "What is your worst symptom?" (e.g., cough, fever, shortness of breath, muscle aches)     Sore throat mostly resolved, Tired, Fever. Chest pain when he coughs. 5. COUGH: "Do you have a cough?" If Yes, ask: "How bad is the cough?"       Yes - chest pain with cough 6. FEVER: "Do you have a fever?" If Yes, ask: "What is your temperature, how was it measured, and when did it start?"     unknown 7.  RESPIRATORY STATUS: "Describe your breathing?" (e.g., shortness of breath, wheezing, unable to speak)      ok 8. BETTER-SAME-WORSE: "Are you getting better, staying the same or getting worse compared to yesterday?"  If getting worse, ask, "In what way?"     same 9. HIGH RISK DISEASE: "Do you have any chronic medical problems?" (e.g., asthma, heart or lung disease, weak immune system, obesity, etc.)     yes 10. VACCINE: "Have you had the COVID-19 vaccine?" If Yes, ask: "Which one, how many shots, when did you get it?"        11. BOOSTER: "Have you received your COVID-19 booster?" If Yes, ask: "Which one and when did you get it?"        12. PREGNANCY: "Is there any chance you are pregnant?" "When was your last menstrual period?"       na 13. OTHER SYMPTOMS: "Do you have any other symptoms?"  (e.g., chills, fatigue, headache, loss of smell or taste, muscle pain, sore throat)       Chills, fatigue 14. O2 SATURATION MONITOR:  "Do you use an oxygen saturation monitor (pulse oximeter) at home?" If Yes, ask "What is your reading (oxygen level) today?" "What is your usual oxygen saturation reading?" (e.g., 95%)  Protocols used: Coronavirus (COVID-19) Diagnosed or Suspected-A-AH

## 2021-11-21 DIAGNOSIS — Z03818 Encounter for observation for suspected exposure to other biological agents ruled out: Secondary | ICD-10-CM | POA: Diagnosis not present

## 2021-11-21 DIAGNOSIS — Z20822 Contact with and (suspected) exposure to covid-19: Secondary | ICD-10-CM | POA: Diagnosis not present

## 2021-11-22 ENCOUNTER — Encounter: Payer: Self-pay | Admitting: Physician Assistant

## 2021-11-22 ENCOUNTER — Telehealth: Payer: Self-pay

## 2021-11-22 ENCOUNTER — Telehealth: Payer: Medicare Other | Admitting: Physician Assistant

## 2021-11-22 DIAGNOSIS — B999 Unspecified infectious disease: Secondary | ICD-10-CM

## 2021-11-22 DIAGNOSIS — U071 COVID-19: Secondary | ICD-10-CM | POA: Diagnosis not present

## 2021-11-22 MED ORDER — MOLNUPIRAVIR EUA 200MG CAPSULE: 4.0000 | ORAL_CAPSULE | Freq: Two times a day (BID) | ORAL | 0 refills | Status: AC

## 2021-11-22 MED ORDER — AMOXICILLIN 500 MG PO CAPS: 500.0000 mg | ORAL_CAPSULE | Freq: Two times a day (BID) | ORAL | 0 refills | Status: AC

## 2021-11-22 NOTE — Telephone Encounter (Signed)
Chief Complaint: COVID positive tested on 12/27 (home), 12/28 (drive through) Symptoms: Nasal congestion, cough (yellow phlegm), sore throat, fatigue, chest pain with coughing Frequency: Ongoing symptoms Pertinent Negatives: Patient denies SOB Disposition: [] ED /[] Urgent Care (no appt availability in office) / [] Appointment(In office/virtual)/ [x]  [facility]/ [] Home Care/ [] Refused Recommended Disposition  Additional Notes: Advised no availability in office, scheduled [facility] today at 1145.    Reason for Disposition  [1] PERSISTING SYMPTOMS OF COVID-19 AND [2] NO medical visit for COVID-19 in past 2 weeks  Answer Assessment - Initial Assessment Questions 1. COVID-19 ONSET: "When did the symptoms of COVID-19 first start?"     N/A 2. DIAGNOSIS CONFIRMATION: "How were you diagnosed?" (e.g., COVID-19 oral or nasal viral test; COVID-19 antibody test; doctor visit)     Home 11/20/21 and drive through test positive on 11/21/21 3. MAIN SYMPTOM:  "What is your main concern or symptom right now?" (e.g., breathing difficulty, cough, fatigue. loss of smell)     Nasal congestion, cough, chest pain with coughing, sore throat 4. SYMPTOM ONSET: "When did the  N/A  start?"     N/A 5. BETTER-SAME-WORSE: "Are you getting better, staying the same, or getting worse over the last 1 to 2 weeks?"     Better, but still with symptoms 6. RECENT MEDICAL VISIT: "Have you been seen by a healthcare provider (doctor, NP, PA) for these persisting COVID-19 symptoms?" If Yes, ask: "When were you seen?" (e.g., date)     No 7. COUGH: "Do you have a cough?" If Yes, ask: "How bad is the cough?"       Yes 8. FEVER: "Do you have a fever?" If Yes, ask: "What is your temperature, how was it measured, and when did it start?"     No 9. BREATHING DIFFICULTY: "Are you having any trouble breathing?" If Yes, ask: "How bad is your breathing?" (e.g., mild, moderate, severe)    - MILD: No SOB at rest, mild SOB with  walking, speaks normally in sentences, can lie down, no retractions, pulse < 100.    - MODERATE: SOB at rest, SOB with minimal exertion and prefers to sit, cannot lie down flat, speaks in phrases, mild retractions, audible wheezing, pulse 100-120.    - SEVERE: Very SOB at rest, speaks in single words, struggling to breathe, sitting hunched forward, retractions, pulse > 120       No 10. HIGH RISK DISEASE: "Do you have any chronic medical problems?" (e.g., asthma, heart or lung disease, weak immune system, obesity, etc.)       N/A 11. VACCINE: "Have you gotten the COVID-19 vaccine?" If Yes, ask: "Which one, how many shots, when did you get it?"       N/A 12. BOOSTER: "Have you received your COVID-19 booster?" If Yes, ask: "Which one and when did you get it?"       N/A 13. PREGNANCY: "Is there any chance you are pregnant?" "When was your last menstrual period?"       N/A 14. OTHER SYMPTOMS: "Do you have any other symptoms?"  (e.g., fatigue, headache, muscle pain, weakness)       Fatigue, little headache 15. O2 SATURATION MONITOR:  "Do you use an oxygen saturation monitor (pulse oximeter) at home?" If Yes, ask "What is your reading (oxygen level) today?" "What is your usual oxygen saturation reading?" (e.g., 95%)      97-98%  Protocols used: Coronavirus (COVID-19) Persisting Symptoms Follow-up Call-A-AH

## 2021-11-22 NOTE — Telephone Encounter (Signed)
FYI-Advised no availability in office, scheduled [facility] today at 1145.

## 2021-11-22 NOTE — Telephone Encounter (Signed)
Please review message and advise. Dr. [name] Section is out of the office today. Patient has called back wanting advice or medications. See triage message below.

## 2021-11-22 NOTE — Progress Notes (Signed)
Virtual Visit Consent   Taylor [name], you are scheduled for a virtual visit with a [facility] provider today.     Just as with appointments in the office, your consent must be obtained to participate.  Your consent will be active for this visit and any virtual visit you may have with one of our providers in the next 365 days.     If you have a MyChart account, a copy of this consent can be sent to you electronically.  All virtual visits are billed to your insurance company just like a traditional visit in the office.    As this is a virtual visit, video technology does not allow for your provider to perform a traditional examination.  This may limit your provider's ability to fully assess your condition.  If your provider identifies any concerns that need to be evaluated in person or the need to arrange testing (such as labs, EKG, etc.), we will make arrangements to do so.     Although advances in technology are sophisticated, we cannot ensure that it will always work on either your end or our end.  If the connection with a video visit is poor, the visit may have to be switched to a telephone visit.  With either a video or telephone visit, we are not always able to ensure that we have a secure connection.     I need to obtain your verbal consent now.   Are you willing to proceed with your visit today?    Taylor [name] has provided verbal consent on 11/22/2021 for a virtual visit (video or telephone).   Mar Daring, PA-C   Date: 11/22/2021 12:07 PM   Virtual Visit via Video Note   I, Mar Daring, connected with  Taylor [name]  ([id number], 12/17/1940) on 11/22/21 at 11:45 AM EST by a video-enabled telemedicine application and verified that I am speaking with the correct person using two identifiers.  Location: Patient: Virtual Visit Location Patient: Home Provider: Virtual Visit Location Provider: Home Office   I discussed the limitations of evaluation and management by  telemedicine and the availability of in person appointments. The patient expressed understanding and agreed to proceed.    Interactive audio and video communications were attempted, although failed due to patient's inability to connect to video. Continued visit with audio only interaction with patient agreement.   History of Present Illness: Taylor [name] is a 80 y.o. who identifies as a male who was assigned male at birth, and is being seen today for Covid 45.  HPI: URI  This is a new problem. Episode onset: symptoms started 11/17/21, tested positive on 12/27 and 12/28 on rapid home test. The problem has been unchanged. There has been no fever. Associated symptoms include chest pain (now improved), congestion, coughing (productive of yellow phlegm), headaches, nausea (mild nausea with feeling of vomiting but unable to vomit), sinus pain, a sore throat, swollen glands and wheezing (mild). Pertinent negatives include no diarrhea, ear pain, plugged ear sensation, rhinorrhea or vomiting. He has tried decongestant (tylenol pm) for the symptoms. The treatment provided mild relief.     Problems:  Patient Active Problem List   Diagnosis Date Noted   Aortic atherosclerosis ([place]) 05/08/2021   Diabetes mellitus without complication ([place]) 87/68/1157   Diarrhea 09/12/2020   Acute kidney injury superimposed on CKD ([place]) 08/10/2020   Hypomagnesemia 04/26/2020   Hypocalcemia 04/19/2020   Numbness and tingling in both hands 04/19/2020   Atrial  fibrillation ([place]) 04/19/2020   Other specified interstitial pulmonary diseases ([place]) 12/06/2019   Abdominal pain    Suspected COVID-19 virus infection 11/22/2019   Pancreatitis 11/22/2019   CAP (community acquired pneumonia) 11/13/2019   Hyperuricemia 03/31/2019   Numbness 05/07/2018   Benign colonic polyp 03/24/2017   Tibialis anterior tenosynovitis 12/21/2015   Pleural nodule 07/27/2015   Psoriatic arthritis ([place]) 05/12/2015   Restrictive lung disease  05/12/2015   Barrett esophagus 03/30/2015   Ache in joint 03/30/2015   Bradycardia 03/30/2015   CAD (coronary artery disease) 03/30/2015   Cardiac defibrillator in place 03/30/2015   Chronic kidney disease (CKD), stage III (moderate) (HCC) 03/30/2015   Cluster headache syndrome 03/30/2015   Degeneration of lumbar or lumbosacral intervertebral disc 03/30/2015   Gout 03/30/2015   Personal history of malignant neoplasm of bladder 03/30/2015   Hypercholesteremia 03/30/2015   LBP (low back pain) 03/30/2015   Personal history of malignant melanoma of skin 03/30/2015   Muscle ache 03/30/2015   Arthritis of knee, degenerative 03/30/2015   Prediabetes 03/30/2015   Psoriasis 03/30/2015   Obstructive sleep apnea 03/30/2015   Chronic venous insufficiency 03/30/2015   History of abdominal aortic aneurysm (AAA) 06/24/2013   History of CVA (cerebrovascular accident) 06/24/2013   Cardiomyopathy, ischemic 06/24/2013   History of AAA (abdominal aortic aneurysm) repair 06/24/2013   Essential (primary) hypertension 08/17/2011   Arrhythmia, sinus node 08/17/2011   History of ventricular fibrillation 29/47/6546   Systolic CHF with reduced left ventricular function, NYHA class 3 ([place]) 02/26/2010   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 04/27/2007   Cardiac pacemaker in situ 10/10/2006    Allergies:  Allergies  Allergen Reactions   Amlodipine Besylate Swelling    Had a reaction when taking with colcrys    Crestor [Rosuvastatin]     Muscle cramps and pain   Rocephin [Ceftriaxone]     unknown   Medications:  Current Outpatient Medications:    amoxicillin (AMOXIL) 500 MG capsule, Take 1 capsule (500 mg total) by mouth 2 (two) times daily for 10 days., Disp: 20 capsule, Rfl: 0   molnupiravir EUA (LAGEVRIO) 200 mg CAPS capsule, Take 4 capsules (800 mg total) by mouth 2 (two) times daily for 5 days., Disp: 40 capsule, Rfl: 0   [START ON 09/25/2022] allopurinol (ZYLOPRIM) 300 MG tablet,  Take 0.5 tablets (150 mg total) by mouth daily., Disp: , Rfl:    apixaban (ELIQUIS) 5 MG TABS tablet, Take 5 mg by mouth 2 (two) times daily., Disp: , Rfl:    aspirin 81 MG EC tablet, Take 81 mg by mouth daily. , Disp: , Rfl:    atorvastatin (LIPITOR) 40 MG tablet, TAKE 1 TABLET BY MOUTH EVERY DAY, Disp: 90 tablet, Rfl: 1   Azelastine-Fluticasone 137-50 MCG/ACT SUSP, Place 1 spray into the nose every 12 (twelve) hours., Disp: 23 g, Rfl: 1   Calcium Carbonate Antacid 600 MG chewable tablet, Chew 1 tablet by mouth daily., Disp: , Rfl:    carvedilol (COREG) 6.25 MG tablet, Take 1 tablet (6.25 mg total) by mouth 2 (two) times daily with a meal., Disp: 180 tablet, Rfl: 1   cetirizine (ZYRTEC) 10 MG tablet, Take 1 tablet (10 mg total) by mouth daily., Disp: 30 tablet, Rfl: 11   Cyanocobalamin (B-12) 2500 MCG TABS, Take 2,500 mcg by mouth daily., Disp: , Rfl:    GLUCOSAMINE HCL PO, Take 1,500 mg by mouth daily. , Disp: , Rfl:    glucose blood (ONETOUCH VERIO) test strip, Use  as instructed to check sugar daily, Disp: 100 each, Rfl: 12   ipratropium (ATROVENT) 0.06 % nasal spray, Place 2 sprays into both nostrils 4 (four) times daily., Disp: 15 mL, Rfl: 12   losartan (COZAAR) 50 MG tablet, Take 1 tablet (50 mg total) by mouth daily., Disp: 90 tablet, Rfl: 0   magnesium oxide (MAG-OX) 400 MG tablet, Take 500 mg by mouth 2 (two) times daily., Disp: , Rfl:    metFORMIN (GLUCOPHAGE-XR) 500 MG 24 hr tablet, Take 2 tablets (1,000 mg total) by mouth daily., Disp: 180 tablet, Rfl: 4   pantoprazole (PROTONIX) 40 MG tablet, TAKE 1 TABLET BY MOUTH EVERY DAY, Disp: 90 tablet, Rfl: 4   potassium chloride SA (KLOR-CON) 20 MEQ tablet, Take 20 mEq by mouth daily., Disp: , Rfl:    sodium chloride (OCEAN) 0.65 % SOLN nasal spray, Place 1 spray into both nostrils as needed for congestion., Disp: 30 mL, Rfl: 0   torsemide (DEMADEX) 20 MG tablet, Take 20 mg by mouth 2 (two) times daily., Disp: , Rfl:    triamcinolone ointment  (KENALOG) 0.1 %, Apply 1 application topically 2 (two) times daily. As directed, Disp: 453 g, Rfl: 1  Observations/Objective: Patient is well-developed, well-nourished in no acute distress.  Resting comfortably at home.  Head is normocephalic, atraumatic.  No labored breathing.  Speech is clear and coherent with logical content.  Patient is alert and oriented at baseline.    Assessment and Plan: 1. COVID-19 - molnupiravir EUA (LAGEVRIO) 200 mg CAPS capsule; Take 4 capsules (800 mg total) by mouth 2 (two) times daily for 5 days.  Dispense: 40 capsule; Refill: 0  2. Superimposed infection - amoxicillin (AMOXIL) 500 MG capsule; Take 1 capsule (500 mg total) by mouth 2 (two) times daily for 10 days.  Dispense: 20 capsule; Refill: 0  - Continue OTC symptomatic management of choice - Will send OTC vitamins and supplement information through AVS - Molnupiravir prescribed - Amoxicillin for possible overlying bacterial pharyngitis - Patient enrolled in MyChart symptom monitoring - Push fluids - Rest as needed - Discussed return precautions and when to seek in-person evaluation, sent via AVS as well   Follow Up Instructions: I discussed the assessment and treatment plan with the patient. The patient was provided an opportunity to ask questions and all were answered. The patient agreed with the plan and demonstrated an understanding of the instructions.  A copy of instructions were sent to the patient via MyChart unless otherwise noted below.   The patient was advised to call back or seek an in-person evaluation if the symptoms worsen or if the condition fails to improve as anticipated.  Time:  I spent 25 minutes with the patient via telehealth technology discussing the above problems/concerns.    Mar Daring, PA-C

## 2021-11-22 NOTE — Telephone Encounter (Signed)
See triage note.

## 2021-11-22 NOTE — Patient Instructions (Signed)
[name], thank you for joining Mar Daring, PA-C for today's virtual visit.  While this provider is not your primary care provider (PCP), if your PCP is located in our provider database this encounter information will be shared with them immediately following your visit.  Consent: (Patient) Taylor [name] provided verbal consent for this virtual visit at the beginning of the encounter.  Current Medications:  Current Outpatient Medications:    amoxicillin (AMOXIL) 500 MG capsule, Take 1 capsule (500 mg total) by mouth 2 (two) times daily for 10 days., Disp: 20 capsule, Rfl: 0   molnupiravir EUA (LAGEVRIO) 200 mg CAPS capsule, Take 4 capsules (800 mg total) by mouth 2 (two) times daily for 5 days., Disp: 40 capsule, Rfl: 0   [START ON 09/25/2022] allopurinol (ZYLOPRIM) 300 MG tablet, Take 0.5 tablets (150 mg total) by mouth daily., Disp: , Rfl:    apixaban (ELIQUIS) 5 MG TABS tablet, Take 5 mg by mouth 2 (two) times daily., Disp: , Rfl:    aspirin 81 MG EC tablet, Take 81 mg by mouth daily. , Disp: , Rfl:    atorvastatin (LIPITOR) 40 MG tablet, TAKE 1 TABLET BY MOUTH EVERY DAY, Disp: 90 tablet, Rfl: 1   Azelastine-Fluticasone 137-50 MCG/ACT SUSP, Place 1 spray into the nose every 12 (twelve) hours., Disp: 23 g, Rfl: 1   Calcium Carbonate Antacid 600 MG chewable tablet, Chew 1 tablet by mouth daily., Disp: , Rfl:    carvedilol (COREG) 6.25 MG tablet, Take 1 tablet (6.25 mg total) by mouth 2 (two) times daily with a meal., Disp: 180 tablet, Rfl: 1   cetirizine (ZYRTEC) 10 MG tablet, Take 1 tablet (10 mg total) by mouth daily., Disp: 30 tablet, Rfl: 11   Cyanocobalamin (B-12) 2500 MCG TABS, Take 2,500 mcg by mouth daily., Disp: , Rfl:    GLUCOSAMINE HCL PO, Take 1,500 mg by mouth daily. , Disp: , Rfl:    glucose blood (ONETOUCH VERIO) test strip, Use as instructed to check sugar daily, Disp: 100 each, Rfl: 12   ipratropium (ATROVENT) 0.06 % nasal spray, Place 2 sprays into both nostrils 4  (four) times daily., Disp: 15 mL, Rfl: 12   losartan (COZAAR) 50 MG tablet, Take 1 tablet (50 mg total) by mouth daily., Disp: 90 tablet, Rfl: 0   magnesium oxide (MAG-OX) 400 MG tablet, Take 500 mg by mouth 2 (two) times daily., Disp: , Rfl:    metFORMIN (GLUCOPHAGE-XR) 500 MG 24 hr tablet, Take 2 tablets (1,000 mg total) by mouth daily., Disp: 180 tablet, Rfl: 4   pantoprazole (PROTONIX) 40 MG tablet, TAKE 1 TABLET BY MOUTH EVERY DAY, Disp: 90 tablet, Rfl: 4   potassium chloride SA (KLOR-CON) 20 MEQ tablet, Take 20 mEq by mouth daily., Disp: , Rfl:    sodium chloride (OCEAN) 0.65 % SOLN nasal spray, Place 1 spray into both nostrils as needed for congestion., Disp: 30 mL, Rfl: 0   torsemide (DEMADEX) 20 MG tablet, Take 20 mg by mouth 2 (two) times daily., Disp: , Rfl:    triamcinolone ointment (KENALOG) 0.1 %, Apply 1 application topically 2 (two) times daily. As directed, Disp: 453 g, Rfl: 1   Medications ordered in this encounter:  Meds ordered this encounter  Medications   molnupiravir EUA (LAGEVRIO) 200 mg CAPS capsule    Sig: Take 4 capsules (800 mg total) by mouth 2 (two) times daily for 5 days.    Dispense:  40 capsule    Refill:  0    Order Specific Question:   Supervising Provider    Answer:   Sabra Heck, BRIAN [[id number]]   amoxicillin (AMOXIL) 500 MG capsule    Sig: Take 1 capsule (500 mg total) by mouth 2 (two) times daily for 10 days.    Dispense:  20 capsule    Refill:  0    Order Specific Question:   Supervising Provider    Answer:   Sabra Heck, [place]     *If you need refills on other medications prior to your next appointment, please contact your pharmacy*  Follow-Up: Call back or seek an in-person evaluation if the symptoms worsen or if the condition fails to improve as anticipated.  Other Instructions Molnupiravir Oral Capsules What is this medication? MOLNUPIRAVIR (mol nue pir a vir) treats COVID-19. It is an antiviral medication. It may decrease the risk of  developing severe symptoms of COVID-19. It may also decrease the chance of going to the hospital. This medication is not approved by the FDA. The FDA has authorized emergency use of this medication during the COVID-19 pandemic. This medicine may be used for other purposes; ask your health care provider or pharmacist if you have questions. COMMON BRAND NAME(S): LAGEVRIO What should I tell my care team before I take this medication? They need to know if you have any of these conditions: Any allergies Any serious illness An unusual or allergic reaction to molnupiravir, other medications, foods, dyes, or preservatives Pregnant or trying to get pregnant Breast-feeding How should I use this medication? Take this medication by mouth with water. Take it as directed on the prescription label at the same time every day. Do not cut, crush or chew this medication. Swallow the capsules whole. You can take it with or without food. If it upsets your stomach, take it with food. Take all of this medication unless your care team tells you to stop it early. Keep taking it even if you think you are better. Talk to your care team about the use of this medication in children. Special care may be needed. Overdosage: If you think you have taken too much of this medicine contact a poison control center or emergency room at once. NOTE: This medicine is only for you. Do not share this medicine with others. What if I miss a dose? If you miss a dose, take it as soon as you can unless it is more than 10 hours late. If it is more than 10 hours late, skip the missed dose. Take the next dose at the normal time. Do not take extra or 2 doses at the same time to make up for the missed dose. What may interact with this medication? Interactions have not been studied. This list may not describe all possible interactions. Give your health care provider a list of all the medicines, herbs, non-prescription drugs, or dietary supplements  you use. Also tell them if you smoke, drink alcohol, or use illegal drugs. Some items may interact with your medicine. What should I watch for while using this medication? Your condition will be monitored carefully while you are receiving this medication. Visit your care team for regular checkups. Tell your care team if your symptoms do not start to get better or if they get worse. Do not become pregnant while taking this medication. You may need a pregnancy test before starting this medication. Women must use a reliable form of birth control while taking this medication and for 4 days after stopping the medication.  Women should inform their care team if they wish to become pregnant or think they might be pregnant. Men should not father a child while taking this medication and for 3 months after stopping it. There is potential for serious harm to an unborn child. Talk to your care team for more information. Do not breast-feed an infant while taking this medication and for 4 days after stopping the medication. What side effects may I notice from receiving this medication? Side effects that you should report to your care team as soon as possible: Allergic reactions--skin rash, itching, hives, swelling of the face, lips, tongue, or throat Side effects that usually do not require medical attention (report these to your care team if they continue or are bothersome): Diarrhea Dizziness Nausea This list may not describe all possible side effects. Call your doctor for medical advice about side effects. You may report side effects to FDA at 1-800-FDA-1088. Where should I keep my medication? Keep out of the reach of children and pets. Store at room temperature between 20 and 25 degrees C (68 and 77 degrees F). Get rid of any unused medication after the expiration date. To get rid of medications that are no longer needed or have expired: Take the medication to a medication take-back program. Check with your  pharmacy or law enforcement to find a location. If you cannot return the medication, check the label or package insert to see if the medication should be thrown out in the garbage or flushed down the toilet. If you are not sure, ask your care team. If it is safe to put it in the trash, take the medication out of the container. Mix the medication with cat litter, dirt, coffee grounds, or other unwanted substance. Seal the mixture in a bag or container. Put it in the trash. NOTE: This sheet is a summary. It may not cover all possible information. If you have questions about this medicine, talk to your doctor, pharmacist, or health care provider.  2022 Elsevier/Gold Standard (2020-11-20 00:00:00)   10 Things You Can Do to Manage Your COVID-19 Symptoms at Home If you have possible or confirmed COVID-19 Stay home except to get medical care. Monitor your symptoms carefully. If your symptoms get worse, call your healthcare provider immediately. Get rest and stay hydrated. If you have a medical appointment, call the healthcare provider ahead of time and tell them that you have or may have COVID-19. For medical emergencies, call 911 and notify the dispatch personnel that you have or may have COVID-19. Cover your cough and sneezes with a tissue or use the inside of your elbow. Wash your hands often with soap and water for at least 20 seconds or clean your hands with an alcohol-based hand sanitizer that contains at least 60% alcohol. As much as possible, stay in a specific room and away from other people in your home. Also, you should use a separate bathroom, if available. If you need to be around other people in or outside of the home, wear a mask. Avoid sharing personal items with other people in your household, like dishes, towels, and bedding. Clean all surfaces that are touched often, like counters, tabletops, and doorknobs. Use household cleaning sprays or wipes according to the label  instructions. [email or web address] 06/09/2020 This information is not intended to replace advice given to you by your health care provider. Make sure you discuss any questions you have with your health care provider. Document Revised: 08/03/2021 Document Reviewed: 08/03/2021 Elsevier Patient Education  2022 [place].    If you have been instructed to have an in-person evaluation today at a local Urgent Care facility, please use the link below. It will take you to a list of all of our available [facility], including address, phone number and hours of operation. Please do not delay care.  [facility]  If you or a family member do not have a primary care provider, use the link below to schedule a visit and establish care. When you choose a [facility] primary care physician or advanced practice provider, you gain a long-term partner in health. Find a Primary Care Provider  Learn more about [facility]'s in-office and virtual care options: [facility]

## 2021-11-22 NOTE — Telephone Encounter (Signed)
Copied from [place] [phone]. Topic: General - Other >> Nov 21, 2021  4:07 PM Alanda Slim E wrote: Reason for CRM: Pt called about his NT message from yesterday to see what Dr. [name] Section advised / please advise

## 2021-12-11 ENCOUNTER — Other Ambulatory Visit: Payer: Self-pay

## 2021-12-11 ENCOUNTER — Ambulatory Visit (INDEPENDENT_AMBULATORY_CARE_PROVIDER_SITE_OTHER): Payer: Medicare Other | Admitting: Family Medicine

## 2021-12-11 ENCOUNTER — Encounter: Payer: Self-pay | Admitting: Family Medicine

## 2021-12-11 VITALS — BP 110/64 | HR 68 | Temp 98.4°F | Resp 16 | Ht 70.0 in | Wt 195.0 lb

## 2021-12-11 DIAGNOSIS — E119 Type 2 diabetes mellitus without complications: Secondary | ICD-10-CM | POA: Diagnosis not present

## 2021-12-11 DIAGNOSIS — I700 Atherosclerosis of aorta: Secondary | ICD-10-CM

## 2021-12-11 DIAGNOSIS — I10 Essential (primary) hypertension: Secondary | ICD-10-CM | POA: Diagnosis not present

## 2021-12-11 DIAGNOSIS — Z95810 Presence of automatic (implantable) cardiac defibrillator: Secondary | ICD-10-CM | POA: Diagnosis not present

## 2021-12-11 DIAGNOSIS — Z8679 Personal history of other diseases of the circulatory system: Secondary | ICD-10-CM

## 2021-12-11 DIAGNOSIS — Z1159 Encounter for screening for other viral diseases: Secondary | ICD-10-CM | POA: Diagnosis not present

## 2021-12-11 DIAGNOSIS — R0981 Nasal congestion: Secondary | ICD-10-CM

## 2021-12-11 DIAGNOSIS — E7800 Pure hypercholesterolemia, unspecified: Secondary | ICD-10-CM

## 2021-12-11 DIAGNOSIS — I4891 Unspecified atrial fibrillation: Secondary | ICD-10-CM | POA: Diagnosis not present

## 2021-12-11 DIAGNOSIS — Z950 Presence of cardiac pacemaker: Secondary | ICD-10-CM

## 2021-12-11 DIAGNOSIS — Z9889 Other specified postprocedural states: Secondary | ICD-10-CM | POA: Diagnosis not present

## 2021-12-11 DIAGNOSIS — Z0000 Encounter for general adult medical examination without abnormal findings: Secondary | ICD-10-CM | POA: Diagnosis not present

## 2021-12-11 DIAGNOSIS — N1832 Chronic kidney disease, stage 3b: Secondary | ICD-10-CM | POA: Diagnosis not present

## 2021-12-11 DIAGNOSIS — I255 Ischemic cardiomyopathy: Secondary | ICD-10-CM | POA: Diagnosis not present

## 2021-12-11 DIAGNOSIS — Z8551 Personal history of malignant neoplasm of bladder: Secondary | ICD-10-CM

## 2021-12-11 DIAGNOSIS — L409 Psoriasis, unspecified: Secondary | ICD-10-CM | POA: Diagnosis not present

## 2021-12-11 DIAGNOSIS — L4050 Arthropathic psoriasis, unspecified: Secondary | ICD-10-CM | POA: Diagnosis not present

## 2021-12-11 DIAGNOSIS — Z8673 Personal history of transient ischemic attack (TIA), and cerebral infarction without residual deficits: Secondary | ICD-10-CM

## 2021-12-11 MED ORDER — AZELASTINE-FLUTICASONE 137-50 MCG/ACT NA SUSP: 1.0000 | Freq: Two times a day (BID) | NASAL | 1 refills | Status: DC

## 2021-12-11 MED ORDER — ATORVASTATIN CALCIUM 40 MG PO TABS: 40.0000 mg | ORAL_TABLET | Freq: Every day | ORAL | 1 refills | Status: DC

## 2021-12-11 MED ORDER — CETIRIZINE HCL 10 MG PO TABS: 10.0000 mg | ORAL_TABLET | Freq: Every day | ORAL | 11 refills | Status: DC

## 2021-12-11 NOTE — Progress Notes (Signed)
BP 110/64 (BP Location: Right Arm, Patient Position: Sitting, Cuff Size: Large)    Pulse 68    Temp 98.4 F (36.9 C) (Temporal)    Resp 16    Ht '5\' 10"'  (1.778 m)    Wt 195 lb (88.5 kg)    SpO2 99%    BMI 27.98 kg/m    Subjective:    Patient ID: Taylor [name], male    DOB: 09-18-1941, 81 y.o.   MRN: [id number]  HPI: Taylor [name] is a 81 y.o. male presenting on 12/11/2021 for comprehensive medical examination. Current medical complaints include:  Hypertension, CAD s/p CABG, afib, CHF, h/o CVA, h/o vfib, HLD, OSA, SSS: - follows with cardiology, most recent visit 10/2021. Due for f/u in June. Most recent ECHO with LVEF 25%. - pacemaker, defib in place - Medications: lipitor, losartan, coreg, ASA 81, eliquis, torsemide - dapagliflozin and entresto too expensive - occasional nose bleed. No blood in stool.  - Compliance: good - Checking BP at home: yes, 110s - Denies any SOB, CP, LE edema, medication SEs, or symptoms of hypotension - CPAP? yes  Diabetes, Type 2 - Last A1c 6.3 06/2021 - Medications: metformin - Compliance: good - Checking BG at home: yes, 110. 180 highest ~6 months ago.  - Eye exam: due,  - Foot exam: due - Statin: yes - PNA vaccine: yes - Denies symptoms of hypoglycemia, numbness extremities, foot ulcers/trauma  CHRONIC KIDNEY DISEASE, h/o gout CKD status: stable Medications renally dose: yes Previous renal evaluation: yes, last appt 09/2021, sees next month Pneumovax:  Up to Date Influenza Vaccine:  Not up to Date - On ARB, allopurinol   Barrett esophagus - protonix daily. Last UGI 2003 without dysplasia. Doesn't see GI. No trouble with swallowing or pain with eating. No heartburn or bloating.   Psorasis - triamcinolone prn, using daily.  H/o bladder cancer - 1997. S/p tumor excision and chemo. Last saw Urology in 1990s. No trouble urinating. Bladder and prostate unremarkable on CT A/P 2021.  AAA - s/p stent graft repair 2008, sac 3.4cm 2021 CT A/P.    Recent COVID - symptom onset 11/17/21. S/p amoxicllin. Wasn't able to get molnupiravir.  Depression Screen done today and results listed below:  Depression screen [facility] 2/9 12/11/2021 06/25/2021 05/08/2021 06/29/2020 05/11/2019  Decreased Interest '2 2 1 ' 0 0  Down, Depressed, Hopeless '2 2 1 ' 0 0  PHQ - 2 Score '4 4 2 ' 0 0  Altered sleeping 0 2 0 - 0  Tired, decreased energy '2 2 1 ' - 0  Change in appetite 0 0 0 - 0  Feeling bad or failure about yourself  0 0 0 - 0  Trouble concentrating 0 1 0 - 0  Moving slowly or fidgety/restless 0 0 0 - 0  Suicidal thoughts 0 0 0 - 0  PHQ-9 Score '6 9 3 ' - 0  Difficult doing work/chores Not difficult at all Not difficult at all Not difficult at all - Not difficult at all    The patient has had 2 falls in the last year while roller skating.  I did complete a risk assessment for falls. A plan of care for falls was not documented.   Past Medical History:  Past Medical History:  Diagnosis Date   AAA (abdominal aortic aneurysm) 06/03/2007   [facility]; Dr. Kellie Simmering   AICD (automatic cardioverter/defibrillator) present    Barrett's esophagus    Bladder cancer [facility])    Bradycardia  CAD (coronary artery disease)    CHF (congestive heart failure) (HCC)    Cluster headache    DDD (degenerative disc disease), lumbar    Dyspnea    WITH EXERTION   Edema    LEFT ANKLE   Fracture of skull base ([place]) 1997   due to fall   GERD (gastroesophageal reflux disease)    Gout    History of bladder cancer 12/1995   Hyperlipidemia    Hypertension    Malignant melanoma ([place]) 12/2012   right dorsal forearm excised   Myocardial infarction [facility])    LAST 2014   Osteoarthritis of knee    Pacemaker 10/10/2006   Pneumonia    2016   Pre-diabetes    Psoriasis    Rib fracture 1997   due to fall   Sleep apnea    CPAP   Stroke [facility])    Venous incompetence     Surgical History:  Past Surgical History:  Procedure Laterality Date   ABDOMINAL AORTIC  ANEURYSM REPAIR  06/03/2007   [facility]; Dr. Kellie Simmering   ANGIOPLASTY  1994   [place]   BLADDER TUMOR EXCISION  12/1995   CATARACT EXTRACTION W/PHACO Left 10/22/2016   Procedure: CATARACT EXTRACTION PHACO AND INTRAOCULAR LENS PLACEMENT ([place]);  Surgeon: Birder Robson, MD;  Location: [facility] ORS;  Service: Ophthalmology;  Laterality: Left;  [place] 47.7 AP% 18.4 CDE 8.78 Fluid pack lot # [id number]   CATARACT EXTRACTION W/PHACO Right 12/10/2016   Procedure: CATARACT EXTRACTION PHACO AND INTRAOCULAR LENS PLACEMENT ([facility]);  Surgeon: Birder Robson, MD;  Location: [facility] ORS;  Service: Ophthalmology;  Laterality: Right;  [place] 00:39 AP% 23.3 CDE 9.13 Fluid pack lot # [id number]   CORONARY ANGIOPLASTY     STENTS X 5   CORONARY ARTERY BYPASS GRAFT  09/22/2006   four   ELBOW BURSA SURGERY     DUE TO GOUT   INSERT / REPLACE / REMOVE PACEMAKER     MELANOMA EXCISION  12/2012   Right forearm    Medications:  Current Outpatient Medications on File Prior to Visit  Medication Sig   [START ON 09/25/2022] allopurinol (ZYLOPRIM) 300 MG tablet Take 0.5 tablets (150 mg total) by mouth daily.   apixaban (ELIQUIS) 5 MG TABS tablet Take 5 mg by mouth 2 (two) times daily.   aspirin 81 MG EC tablet Take 81 mg by mouth daily.    atorvastatin (LIPITOR) 40 MG tablet TAKE 1 TABLET BY MOUTH EVERY DAY   Calcium Carbonate Antacid 600 MG chewable tablet Chew 1 tablet by mouth daily.   carvedilol (COREG) 6.25 MG tablet Take 1 tablet (6.25 mg total) by mouth 2 (two) times daily with a meal.   Cyanocobalamin (B-12) 2500 MCG TABS Take 2,500 mcg by mouth daily.   GLUCOSAMINE HCL PO Take 1,500 mg by mouth daily.    glucose blood (ONETOUCH VERIO) test strip Use as instructed to check sugar daily   ipratropium (ATROVENT) 0.06 % nasal spray Place 2 sprays into both nostrils 4 (four) times daily.   losartan (COZAAR) 50 MG tablet Take 1 tablet (50 mg total) by mouth daily.   magnesium oxide (MAG-OX) 400 MG tablet Take 500  mg by mouth 2 (two) times daily.   metFORMIN (GLUCOPHAGE-XR) 500 MG 24 hr tablet Take 2 tablets (1,000 mg total) by mouth daily.   pantoprazole (PROTONIX) 40 MG tablet TAKE 1 TABLET BY MOUTH EVERY DAY   potassium chloride SA (KLOR-CON) 20 MEQ tablet Take 20 mEq by mouth  daily.   sodium chloride (OCEAN) 0.65 % SOLN nasal spray Place 1 spray into both nostrils as needed for congestion.   torsemide (DEMADEX) 20 MG tablet Take 20 mg by mouth 2 (two) times daily.   triamcinolone ointment (KENALOG) 0.1 % Apply 1 application topically 2 (two) times daily. As directed   No current facility-administered medications on file prior to visit.    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  Independent at baseline.   Social Determinants of Health   Financial Resource Strain: Low Risk    Difficulty of Paying Living Expenses: Not hard at all  Food Insecurity: No Food Insecurity   Worried About Charity fundraiser in the Last Year: Never true   [place] in the Last Year: Never true  Transportation Needs: No Transportation Needs   Lack of  Transportation (Medical): No   Lack of Transportation (Non-Medical): No  Physical Activity: Insufficiently Active   Days of Exercise per Week: 1 day   Minutes of Exercise per Session: 120 min  Stress: No Stress Concern Present   Feeling of Stress : Not at all  Social Connections: Unknown   Frequency of Communication with Friends and Family: Twice a week   Frequency of Social Gatherings with Friends and Family: Twice a week   Attends Religious Services: More than 4 times per year   Active Member of [employer] or Organizations: Yes   Attends Music therapist: More than 4 times per year   Marital Status: Not on file  Intimate Partner Violence: Not on file   Social History   Tobacco Use  Smoking Status Former   Packs/day: 1.00   Types: Cigarettes   Quit date: 11/26/1999   Years since quitting: 22.0  Smokeless Tobacco Never   Social History   Substance and Sexual Activity  Alcohol Use No    Family History:  Family History  Problem Relation Age of Onset   Cancer Mother        Melanoma skin cancer   Heart attack Father 75   Cancer Father        throat cancer   Arthritis Brother     Past medical history, surgical history, medications, allergies, family history and social history reviewed with patient today and changes made to appropriate areas of the chart.      Objective:    BP 110/64 (BP Location: Right  Arm, Patient Position: Sitting, Cuff Size: Large)    Pulse 68    Temp 98.4 F (36.9 C) (Temporal)    Resp 16    Ht '5\' 10"'  (1.778 m)    Wt 195 lb (88.5 kg)    SpO2 99%    BMI 27.98 kg/m   Wt Readings from Last 3 Encounters:  12/11/21 195 lb (88.5 kg)  06/25/21 199 lb (90.3 kg)  05/08/21 195 lb (88.5 kg)    Physical Exam Vitals reviewed.  Constitutional:      General: He is not in acute distress.    Appearance: Normal appearance. He is not ill-appearing.  HENT:     Head: Normocephalic.     Right Ear: Tympanic membrane and external ear normal.     Left Ear:  Tympanic membrane and external ear normal.     Nose: Rhinorrhea present.     Mouth/Throat:     Mouth: Mucous membranes are moist.     Pharynx: Oropharynx is clear.  Eyes:     Extraocular Movements: Extraocular movements intact.  Cardiovascular:     Rate and Rhythm: Normal rate and regular rhythm.  Pulmonary:     Effort: Pulmonary effort is normal. No respiratory distress.     Breath sounds: Normal breath sounds.  Abdominal:     General: Bowel sounds are normal.     Palpations: Abdomen is soft.     Tenderness: There is no abdominal tenderness.  Musculoskeletal:        General: Normal range of motion.     Right lower leg: No edema.     Left lower leg: No edema.  Lymphadenopathy:     Cervical: No cervical adenopathy.  Skin:    General: Skin is warm.  Neurological:     Mental Status: He is oriented to person, place, and time. Mental status is at baseline.     Gait: Gait normal.  Psychiatric:        Mood and Affect: Mood normal.        Behavior: Behavior normal.    Results for orders placed or performed in visit on 06/25/21  PSA Total (Reflex To Free) ([facility] only)  Result Value Ref Range   Prostate Specific Ag, Serum 1.4 0.0 - 4.0 ng/mL   Reflex Criteria Comment   CBC  Result Value Ref Range   WBC 7.2 3.4 - 10.8 x10E3/uL   RBC 4.56 4.14 - 5.80 x10E6/uL   Hemoglobin 14.9 13.0 - 17.7 g/dL   Hematocrit 45.1 37.5 - 51.0 %   MCV 99 (H) 79 - 97 fL   MCH 32.7 26.6 - 33.0 pg   MCHC 33.0 31.5 - 35.7 g/dL   RDW 15.0 11.6 - 15.4 %   Platelets 124 (L) 150 - 450 x10E3/uL  Comprehensive metabolic panel  Result Value Ref Range   Glucose 110 (H) 65 - 99 mg/dL   BUN 29 (H) 8 - 27 mg/dL   Creatinine, Ser 2.02 (H) 0.76 - 1.27 mg/dL   eGFR 33 (L) >59 mL/min/1.73   BUN/Creatinine Ratio 14 10 - 24   Sodium 143 134 - 144 mmol/L   Potassium 4.4 3.5 - 5.2 mmol/L   Chloride 102 96 - 106 mmol/L   CO2 25 20 - 29 mmol/L   Calcium 9.8 8.6 - 10.2 mg/dL   Total Protein 7.2 6.0 - 8.5 g/dL    Albumin 4.0 3.7 - 4.7 g/dL   Globulin, Total 3.2 1.5 - 4.5 g/dL   Albumin/Globulin Ratio 1.3  1.2 - 2.2   Bilirubin Total 1.0 0.0 - 1.2 mg/dL   Alkaline Phosphatase 132 (H) 44 - 121 IU/L   AST 22 0 - 40 IU/L   ALT 12 0 - 44 IU/L  Lipid panel  Result Value Ref Range   Cholesterol, Total 107 100 - 199 mg/dL   Triglycerides 94 0 - 149 mg/dL   HDL 34 (L) >39 mg/dL   VLDL Cholesterol Cal 18 5 - 40 mg/dL   LDL Chol Calc (NIH) 55 0 - 99 mg/dL   Chol/HDL Ratio 3.1 0.0 - 5.0 ratio  Magnesium  Result Value Ref Range   Magnesium 1.5 (L) 1.6 - 2.3 mg/dL  Uric acid  Result Value Ref Range   Uric Acid 3.8 3.8 - 8.4 mg/dL  POCT glycosylated hemoglobin (Hb A1C)  Result Value Ref Range   Hemoglobin A1C 6.3 (A) 4.0 - 5.6 %      Assessment & Plan:   Problem List Items Addressed This Visit       Cardiovascular and Mediastinum   Aortic atherosclerosis (HCC)   Relevant Orders   Comprehensive metabolic panel   Lipid panel   CBC   Hemoglobin A1c   Atrial fibrillation ([place])    Remains in afib today. Continue eliquis. Obtaining labs.      Relevant Orders   Comprehensive metabolic panel   Magnesium   Lipid panel   Cardiomyopathy, ischemic    Stable. Continue meds. Continue to follow with Cardiology.      Relevant Orders   Comprehensive metabolic panel   Magnesium   Lipid panel   CBC   Hemoglobin A1c   Essential (primary) hypertension    Doing well on current regimen, no changes made today.      Relevant Orders   Comprehensive metabolic panel   Lipid panel     Endocrine   Diabetes mellitus without complication ([place])   Relevant Orders   Comprehensive metabolic panel   Lipid panel   Hemoglobin A1c   Ambulatory referral to Ophthalmology     Musculoskeletal and Integument   Psoriasis   Psoriatic arthritis ([place])     Genitourinary   Chronic kidney disease (CKD), stage III (moderate) (HCC)   Relevant Orders   Comprehensive metabolic panel   Magnesium   CBC   Hemoglobin  A1c     Other   Cardiac defibrillator in place   Cardiac pacemaker in situ   History of AAA (abdominal aortic aneurysm) repair   Relevant Orders   Comprehensive metabolic panel   Lipid panel   Hemoglobin A1c   History of CVA (cerebrovascular accident)   Relevant Orders   Comprehensive metabolic panel   Lipid panel   CBC   Hemoglobin A1c   History of ventricular fibrillation   Hypercholesteremia   Relevant Orders   Comprehensive metabolic panel   Lipid panel   Hypomagnesemia   Relevant Orders   Magnesium   Personal history of malignant neoplasm of bladder   Other Visit Diagnoses     Annual physical exam    -  Primary   Relevant Orders   Comprehensive metabolic panel   Magnesium   Lipid panel   CBC   Hemoglobin A1c   Ambulatory referral to Ophthalmology   PSA   Hepatitis C antibody   Congestion of nasal sinus       Relevant Medications   cetirizine (ZYRTEC) 10 MG tablet   Azelastine-Fluticasone 137-50 MCG/ACT SUSP        LABORATORY TESTING:  Health maintenance labs ordered today as discussed above.   The natural history of prostate cancer and ongoing controversy regarding screening and potential treatment outcomes of prostate cancer has been discussed with the patient. The meaning of a false positive PSA and a false negative PSA has been discussed. He indicates understanding of the limitations of this screening test and wishes to proceed with screening PSA testing.   IMMUNIZATIONS:   - Tdap: Tetanus vaccination status reviewed: declines. - Influenza:  due - Pneumococcal: Up to date - HPV: Not applicable - Shingrix vaccine:  insurance won't pay - COVID vaccine: has received 3 doses of mRNA vaccine  SCREENING: - Colonoscopy: Not applicable  Discussed with patient purpose of the colonoscopy is to detect colon cancer at curable precancerous or early stages   - AAA Screening:  known AAA   - Lung cancer screening: n/a  Hep C Screening: due STD testing and  prevention (HIV/chl/gon/syphilis): no concerns Sexual History: not currently.  Incontinence Symptoms: none  PATIENT COUNSELING:    Advanced Care Planning: A voluntary discussion about advance care planning including the explanation and discussion of advance directives.  Discussed health care proxy and Living will, and the patient was able to identify a health care proxy as wife, Pookela Sellin.  Patient does have a living will at present time. If patient does have living will, I have requested they bring this to the clinic to be scanned in to their chart.  Sexuality: Discussed sexually transmitted diseases, partner selection, use of condoms, avoidance of unintended pregnancy  and contraceptive alternatives.   Advised to avoid cigarette smoking.  I discussed with the patient that most people either abstain from alcohol or drink within safe limits (<=14/week and <=4 drinks/occasion for males, <=7/weeks and <= 3 drinks/occasion for females) and that the risk for alcohol disorders and other health effects rises proportionally with the number of drinks per week and how often a drinker exceeds daily limits.  Discussed cessation/primary prevention of drug use and availability of treatment for abuse.   Diet: Encouraged to adjust caloric intake to maintain  or achieve ideal body weight, to reduce intake of dietary saturated fat and total fat, to limit sodium intake by avoiding high sodium foods and not adding table salt, and to maintain adequate dietary potassium and calcium preferably from fresh fruits, vegetables, and low-fat dairy products.    Stressed the importance of regular exercise  Injury prevention: Discussed safety belts, safety helmets, smoke detector, smoking near bedding or upholstery.   Dental health: Discussed importance of regular tooth brushing, flossing, and dental visits.   Follow up plan: NEXT PREVENTATIVE PHYSICAL DUE IN 1 YEAR. No follow-ups on file.

## 2021-12-11 NOTE — Assessment & Plan Note (Signed)
Stable. Continue meds. Continue to follow with Cardiology.

## 2021-12-11 NOTE — Assessment & Plan Note (Signed)
Remains in afib today. Continue eliquis. Obtaining labs.

## 2021-12-11 NOTE — Telephone Encounter (Signed)
[facility] faxed refill request for the following medications:  atorvastatin (LIPITOR) 40 MG tablet   Please advise.  

## 2021-12-11 NOTE — Assessment & Plan Note (Signed)
Doing well on current regimen, no changes made today. 

## 2021-12-11 NOTE — Telephone Encounter (Signed)
Patient seen by Dr. Ky Barban today for CPE. Please advise on refill request.

## 2021-12-12 LAB — COMPREHENSIVE METABOLIC PANEL
ALT: 16 IU/L (ref 0–44)
AST: 24 IU/L (ref 0–40)
Albumin/Globulin Ratio: 1.5 (ref 1.2–2.2)
Albumin: 4.4 g/dL (ref 3.7–4.7)
Alkaline Phosphatase: 168 IU/L — ABNORMAL HIGH (ref 44–121)
BUN/Creatinine Ratio: 15 (ref 10–24)
BUN: 27 mg/dL (ref 8–27)
Bilirubin Total: 0.5 mg/dL (ref 0.0–1.2)
CO2: 28 mmol/L (ref 20–29)
Calcium: 9.5 mg/dL (ref 8.6–10.2)
Chloride: 101 mmol/L (ref 96–106)
Creatinine, Ser: 1.85 mg/dL — ABNORMAL HIGH (ref 0.76–1.27)
Globulin, Total: 2.9 g/dL (ref 1.5–4.5)
Glucose: 103 mg/dL — ABNORMAL HIGH (ref 70–99)
Potassium: 4.2 mmol/L (ref 3.5–5.2)
Sodium: 142 mmol/L (ref 134–144)
Total Protein: 7.3 g/dL (ref 6.0–8.5)
eGFR: 37 mL/min/{1.73_m2} — ABNORMAL LOW (ref >59)

## 2021-12-12 LAB — CBC
Hematocrit: 47.5 % (ref 37.5–51.0)
Hemoglobin: 16.3 g/dL (ref 13.0–17.7)
MCH: 32.7 pg (ref 26.6–33.0)
MCHC: 34.3 g/dL (ref 31.5–35.7)
MCV: 95 fL (ref 79–97)
Platelets: 160 10*3/uL (ref 150–450)
RBC: 4.98 x10E6/uL (ref 4.14–5.80)
RDW: 13.2 % (ref 11.6–15.4)
WBC: 7.7 10*3/uL (ref 3.4–10.8)

## 2021-12-12 LAB — LIPID PANEL
Chol/HDL Ratio: 2.9 ratio (ref 0.0–5.0)
Cholesterol, Total: 112 mg/dL (ref 100–199)
HDL: 38 mg/dL — ABNORMAL LOW (ref >39)
LDL Chol Calc (NIH): 54 mg/dL (ref 0–99)
Triglycerides: 107 mg/dL (ref 0–149)
VLDL Cholesterol Cal: 20 mg/dL (ref 5–40)

## 2021-12-12 LAB — HEMOGLOBIN A1C
Est. average glucose Bld gHb Est-mCnc: 140 mg/dL
Hgb A1c MFr Bld: 6.5 % — ABNORMAL HIGH (ref 4.8–5.6)

## 2021-12-12 LAB — HEPATITIS C ANTIBODY: Hep C Virus Ab: 0.1 s/co ratio (ref 0.0–0.9)

## 2021-12-12 LAB — MAGNESIUM: Magnesium: 1.8 mg/dL (ref 1.6–2.3)

## 2021-12-12 LAB — PSA: Prostate Specific Ag, Serum: 1.2 ng/mL (ref 0.0–4.0)

## 2022-01-07 DIAGNOSIS — M3501 Sicca syndrome with keratoconjunctivitis: Secondary | ICD-10-CM | POA: Diagnosis not present

## 2022-01-07 LAB — HM DIABETES EYE EXAM

## 2022-01-10 ENCOUNTER — Other Ambulatory Visit: Payer: Self-pay | Admitting: Family Medicine

## 2022-01-18 ENCOUNTER — Telehealth: Payer: Self-pay

## 2022-01-18 NOTE — Progress Notes (Signed)
Chronic Care Management  APPOINTMENT REMINDER   CONN TROMBETTA was reminded to have all medications, supplements and any blood glucose and blood pressure readings available for review with [name], Pharm. D, at his telephone visit on 01/21/2022 at 3:45 pm.   Patient Confirm appointment.  [place] Pharmacist Assistant [phone]

## 2022-01-21 ENCOUNTER — Ambulatory Visit (INDEPENDENT_AMBULATORY_CARE_PROVIDER_SITE_OTHER): Payer: Medicare Other

## 2022-01-21 DIAGNOSIS — I4891 Unspecified atrial fibrillation: Secondary | ICD-10-CM

## 2022-01-21 DIAGNOSIS — E119 Type 2 diabetes mellitus without complications: Secondary | ICD-10-CM

## 2022-01-21 NOTE — Progress Notes (Signed)
Chronic Care Management Pharmacy Note  01/29/2022 Name:  Taylor [name] MRN:  [id number] DOB:  02-Dec-1940  Summary: Patient presents for CCM follow-up.   Recommendations/Changes made from today's visit: -Continue current medications  Plan: CPP follow-up in 4 months.   Subjective: Taylor [name] is an 81 y.o. year old male who is a primary patient of [name], [name], MD.  The CCM team was consulted for assistance with disease management and care coordination needs.    Engaged with patient by telephone for follow up visit in response to provider referral for pharmacy case management and/or care coordination services.   Consent to Services:  The patient was given information about Chronic Care Management services, agreed to services, and gave verbal consent prior to initiation of services.  Please see initial visit note for detailed documentation.   Patient Care Team: Birdie Sons, MD as PCP - General (Family Medicine) Corey Skains, MD as Consulting Physician (Cardiology) Birder Robson, MD as Referring Physician (Ophthalmology) Ottie Glazier, MD as Consulting Physician (Pulmonary Disease) Germaine Pomfret, [facility] (Pharmacist)  Recent office visits: 05/08/21: Patient presented to Dr. [name] Section for follow-up.  04/18/21: Video visit with Dr. [name] Section for leg abscess. Doxycycline 100 mg BID started.  02/23/21: Patient presented to Dr. [name] Section for follow-up. A1c improved to 7.1%. eGFr 47. Mag 1.4. Tizanidine started.  11/21/20: Patient presented to Dr. [name] Section for follow-up. Breo Elipta and furosemide stopped.   Recent consult visits: 09/27/21: Patient presented to Dr. Lanora Manis (Nephrology) for follow-up.  05/14/21: Patient preesnted to Dr. Nehemiah Massed (Cardiology) for follow-up.  03/01/21: Patient presented to Dr. Nehemiah Massed (Cardiology) for follow-up. Carvedilol 6.25 mg twice daily  11/30/20: Patient presented to Hassell Done, NP (Cardiology) for follow-up. Eliquis 5 mg twice daily started  for anticoagulation.   Hospital visits: None in previous 6 months   Objective:  Lab Results  Component Value Date   CREATININE 1.85 (H) 12/11/2021   BUN 27 12/11/2021   GFRNONAA 52 (L) 11/21/2020   GFRAA 60 11/21/2020   NA 142 12/11/2021   K 4.2 12/11/2021   CALCIUM 9.5 12/11/2021   CO2 28 12/11/2021   GLUCOSE 103 (H) 12/11/2021    Lab Results  Component Value Date/Time   HGBA1C 6.5 (H) 12/11/2021 10:51 AM   HGBA1C 6.3 (A) 06/25/2021 08:22 AM   HGBA1C 7.1 (A) 02/23/2021 01:53 PM   HGBA1C 8.2 (H) 10/04/2020 09:05 AM    Last diabetic Eye exam: No results found for: HMDIABEYEEXA  Last diabetic Foot exam: No results found for: HMDIABFOOTEX   Lab Results  Component Value Date   CHOL 112 12/11/2021   HDL 38 (L) 12/11/2021   LDLCALC 54 12/11/2021   TRIG 107 12/11/2021   CHOLHDL 2.9 12/11/2021    Hepatic Function Latest Ref Rng & Units 12/11/2021 06/25/2021 02/23/2021  Total Protein 6.0 - 8.5 g/dL 7.3 7.2 -  Albumin 3.7 - 4.7 g/dL 4.4 4.0 4.3  AST 0 - 40 IU/L 24 22 -  ALT 0 - 44 IU/L 16 12 -  Alk Phosphatase 44 - 121 IU/L 168(H) 132(H) -  Total Bilirubin 0.0 - 1.2 mg/dL 0.5 1.0 -  Bilirubin, Direct 0.0 - 0.2 mg/dL - - -    Lab Results  Component Value Date/Time   TSH 1.331 11/23/2019 04:58 AM   TSH 3.430 05/15/2015 09:08 AM    CBC Latest Ref Rng & Units 12/11/2021 06/25/2021 08/11/2020  WBC 3.4 - 10.8 x10E3/uL 7.7 7.2 9.4  Hemoglobin 13.0 - 17.7 g/dL  14.9 13.3  °Hematocrit 37.5 - 51.0 % 47.5 45.1 41.6  °Platelets 150 - 450 x10E3/uL 160 124(L) 105(L)  ° ° °Lab Results  °Component Value Date/Time  ° VD25OH 46.58 04/19/2020 04:16 PM  ° VD25OH 47.4 05/15/2015 09:08 AM  ° ° °Clinical ASCVD: Yes  °The ASCVD Risk score (Arnett DK, et al., 2019) failed to calculate for the following reasons: °  The patient has a prior MI or stroke diagnosis   ° °Depression screen PHQ 2/9 12/11/2021 06/25/2021 05/08/2021  °Decreased Interest 2 2 1  °Down, Depressed, Hopeless 2 2 1  °PHQ - 2 Score 4 4  2  °Altered sleeping 0 2 0  °Tired, decreased energy 2 2 1  °Change in appetite 0 0 0  °Feeling bad or failure about yourself  0 0 0  °Trouble concentrating 0 1 0  °Moving slowly or fidgety/restless 0 0 0  °Suicidal thoughts 0 0 0  °PHQ-9 Score 6 9 3  °Difficult doing work/chores Not difficult at all Not difficult at all Not difficult at all  °  °Social History  ° °Tobacco Use  °Smoking Status Former  ° Packs/day: 1.00  ° Types: Cigarettes  ° Quit date: 11/26/1999  ° Years since quitting: 22.1  °Smokeless Tobacco Never  ° °BP Readings from Last 3 Encounters:  °12/11/21 110/64  °06/25/21 126/76  °05/08/21 113/72  ° °Pulse Readings from Last 3 Encounters:  °12/11/21 68  °06/25/21 60  °05/08/21 61  ° °Wt Readings from Last 3 Encounters:  °12/11/21 195 lb (88.5 kg)  °06/25/21 199 lb (90.3 kg)  °05/08/21 195 lb (88.5 kg)  ° °BMI Readings from Last 3 Encounters:  °12/11/21 27.98 kg/m²  °06/25/21 28.55 kg/m²  °05/08/21 27.98 kg/m²  ° ° °Assessment/Interventions: Review of patient past medical history, allergies, medications, health status, including review of consultants reports, laboratory and other test data, was performed as part of comprehensive evaluation and provision of chronic care management services.  ° °SDOH:  (Social Determinants of Health) assessments and interventions performed: Yes °SDOH Interventions   ° °Flowsheet Row Most Recent Value  °SDOH Interventions   °Financial Strain Interventions Intervention Not Indicated  ° °  ° ° °SDOH Screenings  ° °Alcohol Screen: Low Risk   ° Last Alcohol Screening Score (AUDIT): 0  °Depression (PHQ2-9): Medium Risk  ° PHQ-2 Score: 6  °Financial Resource Strain: Low Risk   ° Difficulty of Paying Living Expenses: Not hard at all  °Food Insecurity: No Food Insecurity  ° Worried About Running Out of Food in the Last Year: Never true  ° Ran Out of Food in the Last Year: Never true  °Housing: Low Risk   ° Last Housing Risk Score: 0  °Physical Activity: Insufficiently Active  °  Days of Exercise per Week: 1 day  ° Minutes of Exercise per Session: 120 min  °Social Connections: Unknown  ° Frequency of Communication with Friends and Family: Twice a week  ° Frequency of Social Gatherings with Friends and Family: Twice a week  ° Attends Religious Services: More than 4 times per year  ° Active Member of Clubs or Organizations: Yes  ° Attends Club or Organization Meetings: More than 4 times per year  ° Marital Status: Not on file  °Stress: No Stress Concern Present  ° Feeling of Stress : Not at all  °Tobacco Use: Medium Risk  ° Smoking Tobacco Use: Former  ° Smokeless Tobacco Use: Never  ° Passive Exposure: Not on file  °Transportation Needs:   Needs: No Transportation Needs   Lack of Transportation (Medical): No   Lack of Transportation (Non-Medical): No    CCM Care Plan  Allergies  Allergen Reactions   Amlodipine Besylate Swelling    Had a reaction when taking with colcrys    Crestor [Rosuvastatin]     Muscle cramps and pain   Rocephin [Ceftriaxone]     unknown    Medications Reviewed Today     Reviewed by Myles Gip, DO (Physician) on 12/11/21 at 1222  Med List Status: <None>   Medication Order Taking? Sig Documenting Provider Last Dose Status Informant  allopurinol (ZYLOPRIM) 300 MG tablet [id number] Yes Take 0.5 tablets (150 mg total) by mouth daily. Birdie Sons, MD Taking Active   apixaban (ELIQUIS) 5 MG TABS tablet [id number] Yes Take 5 mg by mouth 2 (two) times daily. [provider] Taking Active            Med Note Michaelle Birks, [place] Jan 23, 2021  2:10 PM) Prescribed by Hassell Done, NP  aspirin 81 MG EC tablet [id number] Yes Take 81 mg by mouth daily.  [provider] Taking Active Self           Med Note Daron Offer A   Wed Jan 24, 2021  8:17 AM)    atorvastatin (LIPITOR) 40 MG tablet [id number] Yes TAKE 1 TABLET BY MOUTH EVERY DAY Birdie Sons, MD Taking Active   Azelastine-Fluticasone 137-50 MCG/ACT SUSP [id number]  Place  1 spray into the nose every 12 (twelve) hours. Myles Gip, DO  Active   Calcium Carbonate Antacid 600 MG chewable tablet [id number] Yes Chew 1 tablet by mouth daily. [provider] Taking Active Self  carvedilol (COREG) 6.25 MG tablet [id number] Yes Take 1 tablet (6.25 mg total) by mouth 2 (two) times daily with a meal. [name], [name], MD Taking Active   cetirizine (ZYRTEC) 10 MG tablet [id number]  Take 1 tablet (10 mg total) by mouth daily. Myles Gip, DO  Active   Cyanocobalamin (B-12) 2500 MCG TABS [id number] Yes Take 2,500 mcg by mouth daily. [provider] Taking Active   GLUCOSAMINE HCL PO [id number] Yes Take 1,500 mg by mouth daily.  [provider] Taking Active Self  glucose blood (ONETOUCH VERIO) test strip [id number] Yes Use as instructed to check sugar daily [name], [name], MD Taking Active Self  ipratropium (ATROVENT) 0.06 % nasal spray [id number] Yes Place 2 sprays into both nostrils 4 (four) times daily. Gwyneth Sprout, FNP Taking Active   losartan (COZAAR) 50 MG tablet [id number] Yes Take 1 tablet (50 mg total) by mouth daily. Birdie Sons, MD Taking Active   magnesium oxide (MAG-OX) 400 MG tablet [id number] Yes Take 500 mg by mouth 2 (two) times daily. [provider] Taking Active Self  metFORMIN (GLUCOPHAGE-XR) 500 MG 24 hr tablet [id number] Yes Take 2 tablets (1,000 mg total) by mouth daily. Birdie Sons, MD Taking Active   pantoprazole (PROTONIX) 40 MG tablet [id number] Yes TAKE 1 TABLET BY MOUTH EVERY DAY Birdie Sons, MD Taking Active   potassium chloride SA (KLOR-CON) 20 MEQ tablet [id number] Yes Take 20 mEq by mouth daily. [provider] Taking Active            Med Note Michaelle Birks, [place] Jan 23, 2021  2:10 PM) Prescribed by Hassell Done, NP   sodium chloride (OCEAN) 0.65 % SOLN nasal spray [id number] Yes Place 1  spray into both nostrils as needed for congestion. Gwyneth Sprout, FNP Taking Active    torsemide (DEMADEX) 20 MG tablet [id number] Yes Take 20 mg by mouth 2 (two) times daily. [provider] Taking Active            Med Note Michaelle Birks, [place] Jan 23, 2021  2:10 PM) Prescribed by Dr. Nehemiah Massed   triamcinolone ointment (KENALOG) 0.1 % [id number] Yes Apply 1 application topically 2 (two) times daily. As directed Birdie Sons, MD Taking Active             Patient Active Problem List   Diagnosis Date Noted   Aortic atherosclerosis ([place]) 05/08/2021   Diabetes mellitus without complication ([place]) 28/20/6015   Diarrhea 09/12/2020   Acute kidney injury superimposed on CKD ([place]) 08/10/2020   Hypomagnesemia 04/26/2020   Hypocalcemia 04/19/2020   Numbness and tingling in both hands 04/19/2020   Atrial fibrillation ([place]) 04/19/2020   Other specified interstitial pulmonary diseases ([place]) 12/06/2019   Abdominal pain    Suspected COVID-19 virus infection 11/22/2019   Pancreatitis 11/22/2019   CAP (community acquired pneumonia) 11/13/2019   Hyperuricemia 03/31/2019   Numbness 05/07/2018   Benign colonic polyp 03/24/2017   Tibialis anterior tenosynovitis 12/21/2015   Pleural nodule 07/27/2015   Psoriatic arthritis ([place]) 05/12/2015   Restrictive lung disease 05/12/2015   Barrett esophagus 03/30/2015   Ache in joint 03/30/2015   Bradycardia 03/30/2015   CAD (coronary artery disease) 03/30/2015   Cardiac defibrillator in place 03/30/2015   Chronic kidney disease (CKD), stage III (moderate) ([place]) 03/30/2015   Cluster headache syndrome 03/30/2015   Degeneration of lumbar or lumbosacral intervertebral disc 03/30/2015   Gout 03/30/2015   Personal history of malignant neoplasm of bladder 03/30/2015   Hypercholesteremia 03/30/2015   LBP (low back pain) 03/30/2015   Personal history of malignant melanoma of skin 03/30/2015   Muscle ache 03/30/2015   Arthritis of knee, degenerative 03/30/2015   Psoriasis 03/30/2015   Obstructive sleep apnea 03/30/2015    Chronic venous insufficiency 03/30/2015   History of abdominal aortic aneurysm (AAA) 06/24/2013   History of CVA (cerebrovascular accident) 06/24/2013   Cardiomyopathy, ischemic 06/24/2013   History of AAA (abdominal aortic aneurysm) repair 06/24/2013   Essential (primary) hypertension 08/17/2011   Arrhythmia, sinus node 08/17/2011   History of ventricular fibrillation 61/53/7943   Systolic CHF with reduced left ventricular function, NYHA class 3 ([place]) 02/26/2010   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 04/27/2007   Cardiac pacemaker in situ 10/10/2006    Immunization History  Administered Date(s) Administered   Fluad Quad(high Dose 65+) 10/04/2020   Influenza Split 09/18/2011   Influenza, High Dose Seasonal PF 08/16/2014, 10/09/2016, 10/09/2017, 08/26/2018   Influenza,inj,Quad PF,6+ Mos 12/20/2015   PFIZER(Purple Top)SARS-COV-2 Vaccination 02/24/2020, 03/21/2020   Pneumococcal Conjugate-13 05/09/2014   Pneumococcal Polysaccharide-23 05/12/2015    Conditions to be addressed/monitored:  Hypertension, Hyperlipidemia, Diabetes, Atrial Fibrillation, Heart Failure, Coronary Artery Disease, Chronic Kidney Disease and Gout  Care Plan : General Pharmacy (Adult)  Updates made by Germaine Pomfret, RPH since 01/29/2022 12:00 AM     Problem: Hypertension, Hyperlipidemia, Diabetes, Atrial Fibrillation, Heart Failure, Coronary Artery Disease, Chronic Kidney Disease and Gout   Priority: High     Long-Range Goal: Patient-Specific Goal   Start Date: 01/23/2021  Expected End Date: 10/24/2022  This Visit's Progress: On track  Recent Progress: On track  Priority: High  Note:   Current Barriers:  Unable to independently afford treatment  Unable to achieve control of diabetes  ° °Pharmacist Clinical Goal(s):  °Over the next 90 days, patient will verbalize ability to afford treatment regimen °achieve control of diabetes as evidenced by A1c less than 8% through collaboration  with PharmD and provider.  ° °Interventions: °1:1 collaboration with [name], Donald E, MD regarding development and update of comprehensive plan of care as evidenced by provider attestation and co-signature °Inter-disciplinary care team collaboration (see longitudinal plan of care) °Comprehensive medication review performed; medication list updated in electronic medical record ° °Hyperlipidemia: (LDL goal < 70) °-History of CAD, history of CVA  °-Controlled °-Current treatment: °Atorvastatin 40 mg daily  °-Medications previously tried: NA  °-Educated on Benefits of statin for ASCVD risk reduction; °Importance of limiting foods high in cholesterol; °-Recommended to continue current medication ° °Diabetes (A1c goal <8%) °-Diagnosed Jan 2021 °-Controlled °-Current medications: °Metformin XR 500 mg 2 tablets daily: Appropriate, Effective, Safe, Accessible  °-Medications previously tried: NA  °-Current home glucose readings °fasting glucose: 110-114 °-Denies hypoglycemic/hyperglycemic symptoms  °-Current meal patterns: cut out sodas. Replaced with twist water. 2 sandwiches with lunch down to 1.  °-Continue current medications  ° °Atrial Fibrillation (Goal: prevent stroke and major bleeding) °-Controlled °-CHADSVASC: 8 °-Current treatment: °Rate control: Carvedilol 6.25 mg twice daily: Appropriate, Effective, Safe, Accessible °Anticoagulation: Eliquis 5 mg daily: Appropriate, Effective, Safe, Accessible   °-Medications previously tried: NA °-Home BP and HR readings: NA  °-Continue current medications ° °Heart Failure (Goal: control symptoms and prevent exacerbations) °-Controlled °Type: Systolic °-NYHA Class: III (marked limitation of activity) °-Ejection fraction: 35-40% (Date: Sep 2021) °-Current treatment: °Carvedilol 12.5 mg daily  °Losartan 50 mg daily  °Torsemide 20 mg twice daily  °-Medications previously tried: Entresto, Amlodipine, valsartan, spironolactone, Carvedilol °-Current home BP/HR readings:   ° 125/70 ° 145/80 °-weight has been stable, ranging from 187-195  °-Educated on Importance of weighing daily; if you gain more than 3 pounds in one day or 5 pounds in one week, contact provider's office °-CHANGE Carvedilol to 6.25 mg twice daily ° °Gout (Goal: prevent gout flares) °-Controlled °-Last Uric acid elevated above goal, but patient asymptomatic.  °-Current treatment  °Allopurinol 300 mg 1/2 tablets daily  °-Medications previously tried: NA  °-Last flare: 2 years  °-Counseled on diet and exercise extensively °Recommended to continue current medication ° °GERD (Goal: Minimize symptoms of heartburn or reflux) °-History of Barrett's Esophagus  °-Controlled °-Current treatment  °Pantoprazole 40 mg daily  °-Medications previously tried: NA  °-Recommended to continue current medication ° °Chronic Kidney Disease Stage 3b  °-All medications assessed for renal dosing and appropriateness in chronic kidney disease. °-Could consider SGLT2 inhibitor in the future.  °-Recommended to continue current medication ° °Patient Goals/Self-Care Activities °Over the next 90 days, patient will:  °- check glucose daily before breakfast, document, and provide at future appointments °check blood pressure 2-3 times weekly , document, and provide at future appointments °weigh daily, and contact provider if weight gain of greater than 3 pounds in one day ° °Follow Up Plan: Telephone follow up appointment with care management team member scheduled for:  04/30/2022 at 3:45 PM °  ° °Medication Assistance: None required.  Patient affirms current coverage meets needs. ° °Compliance/Adherence/Medication fill history: °Care Gaps: °Ophthalmology Exam  °Shingrix, Covid, Pneumonia Vaccines  ° °Star-Rating Drugs: °Atorvastatin 40 mg: LF 12/11/21 for 90-DS °Losartan 50 mg: LF 01/10/22 for 90-DS °Metformin XR: LF 12/11/21 for 90-DS ° °Patient's preferred pharmacy is: ° °[id number] - [place], [place] - [address] [facility] [address] [place] [address] Phone: [phone] Fax: [phone]   Uses pill box? Yes Pt endorses 100% compliance  We discussed: Current pharmacy is preferred with insurance plan and patient is satisfied with pharmacy services Patient decided to: Continue current medication management strategy  Care Plan and Follow Up Patient Decision:  Patient agrees to Care Plan and Follow-up.  Plan: Telephone follow up appointment with care management team member scheduled for:  04/30/2022 at 3:45 PM  [name], PharmD, [place], [place] [phone]

## 2022-01-22 DIAGNOSIS — I4891 Unspecified atrial fibrillation: Secondary | ICD-10-CM | POA: Diagnosis not present

## 2022-01-22 DIAGNOSIS — E119 Type 2 diabetes mellitus without complications: Secondary | ICD-10-CM

## 2022-01-29 NOTE — Patient Instructions (Signed)
Visit Information It was great speaking with you today!  Please let me know if you have any questions about our visit.   Goals Addressed             This Visit's Progress    Monitor and Manage My Blood Sugar-Diabetes Type 2   On track    Timeframe:  Long-Range Goal Priority:  High Start Date:  01/23/2021                           Expected End Date: 07/26/2022                  Follow Up within 90 days   -Check blood sugar once daily, before breakfast  - check blood sugar if I feel it is too high or too low - enter blood sugar readings and medication or insulin into daily log    Why is this important?   Checking your blood sugar at home helps to keep it from getting very high or very low.  Writing the results in a diary or log helps the doctor know how to care for you.  Your blood sugar log should have the time, date and the results.  Also, write down the amount of insulin or other medicine that you take.  Other information, like what you ate, exercise done and how you were feeling, will also be helpful.     Notes:         Patient Care Plan: General Pharmacy (Adult)     Problem Identified: Hypertension, Hyperlipidemia, Diabetes, Atrial Fibrillation, Heart Failure, Coronary Artery Disease, Chronic Kidney Disease and Gout   Priority: High     Long-Range Goal: Patient-Specific Goal   Start Date: 01/23/2021  Expected End Date: 10/24/2022  This Visit's Progress: On track  Recent Progress: On track  Priority: High  Note:   Current Barriers:  Unable to independently afford treatment regimen Unable to achieve control of diabetes   Pharmacist Clinical Goal(s):  Over the next 90 days, patient will verbalize ability to afford treatment regimen achieve control of diabetes as evidenced by A1c less than 8% through collaboration with PharmD and provider.   Interventions: 1:1 collaboration with Birdie Sons, MD regarding development and update of comprehensive plan of care as  evidenced by provider attestation and co-signature Inter-disciplinary care team collaboration (see longitudinal plan of care) Comprehensive medication review performed; medication list updated in electronic medical record  Hyperlipidemia: (LDL goal < 70) -History of CAD, history of CVA  -Controlled -Current treatment: Atorvastatin 40 mg daily  -Medications previously tried: NA  -Educated on Benefits of statin for ASCVD risk reduction; Importance of limiting foods high in cholesterol; -Recommended to continue current medication  Diabetes (A1c goal <8%) -Diagnosed Jan 2021 -Controlled -Current medications: Metformin XR 500 mg 2 tablets daily: Appropriate, Effective, Safe, Accessible  -Medications previously tried: NA  -Current home glucose readings fasting glucose: 110-114 -Denies hypoglycemic/hyperglycemic symptoms  -Current meal patterns: cut out sodas. Replaced with twist water. 2 sandwiches with lunch down to 1.  -Continue current medications   Atrial Fibrillation (Goal: prevent stroke and major bleeding) -Controlled -CHADSVASC: 8 -Current treatment: Rate control: Carvedilol 6.25 mg twice daily: Appropriate, Effective, Safe, Accessible Anticoagulation: Eliquis 5 mg daily: Appropriate, Effective, Safe, Accessible   -Medications previously tried: NA -Home BP and HR readings: NA  -Continue current medications  Heart Failure (Goal: control symptoms and prevent exacerbations) -Controlled Type: Systolic -NYHA Class: III (marked limitation  of activity) -Ejection fraction: 35-40% (Date: Sep 2021) -Current treatment: Carvedilol 12.5 mg daily  Losartan 50 mg daily  Torsemide 20 mg twice daily  -Medications previously tried: Entresto, Amlodipine, valsartan, spironolactone, Carvedilol -Current home BP/HR readings:   125/70  145/80 -weight has been stable, ranging from 187-195  -Educated on Importance of weighing daily; if you gain more than 3 pounds in one day or 5 pounds in  one week, contact provider's office -CHANGE Carvedilol to 6.25 mg twice daily  Gout (Goal: prevent gout flares) -Controlled -Last Uric acid elevated above goal, but patient asymptomatic.  -Current treatment  Allopurinol 300 mg 1/2 tablets daily  -Medications previously tried: NA  -Last flare: 2 years  -Counseled on diet and exercise extensively Recommended to continue current medication  GERD (Goal: Minimize symptoms of heartburn or reflux) -History of Barrett's Esophagus  -Controlled -Current treatment  Pantoprazole 40 mg daily  -Medications previously tried: NA  -Recommended to continue current medication  Chronic Kidney Disease Stage 3b  -All medications assessed for renal dosing and appropriateness in chronic kidney disease. -Could consider SGLT2 inhibitor in the future.  -Recommended to continue current medication  Patient Goals/Self-Care Activities Over the next 90 days, patient will:  - check glucose daily before breakfast, document, and provide at future appointments check blood pressure 2-3 times weekly , document, and provide at future appointments weigh daily, and contact provider if weight gain of greater than 3 pounds in one day  Follow Up Plan: Telephone follow up appointment with care management team member scheduled for:  04/30/2022 at 3:45 PM    Patient agreed to services and verbal consent obtained.   Patient verbalizes understanding of instructions and care plan provided today and agrees to view in [place]. Active MyChart status confirmed with patient.    [name], PharmD, [place], CPP  Clinical Pharmacist Practitioner  [facility] [phone]

## 2022-02-05 DIAGNOSIS — I495 Sick sinus syndrome: Secondary | ICD-10-CM | POA: Diagnosis not present

## 2022-02-14 ENCOUNTER — Encounter: Payer: Self-pay | Admitting: Family Medicine

## 2022-02-27 ENCOUNTER — Telehealth: Payer: Self-pay

## 2022-02-27 NOTE — Progress Notes (Signed)
? ? ?Chronic Care Management ?Pharmacy Assistant  ? ?Name: Taylor [name]  MRN: [id number] DOB: February 28, 1941 ? ?Reason for Encounter: Diabetes Disease State Call. ?  ?Recent office visits:  ?Np recent office visit ? ?Recent consult visits:  ?No recent Consult visit ? ?Hospital visits:  ?None in previous 6 months ? ?Medications: ?Outpatient Encounter Medications as of 02/27/2022  ?Medication Sig Note  ? [START ON 09/25/2022] allopurinol (ZYLOPRIM) 300 MG tablet Take 0.5 tablets (150 mg total) by mouth daily.   ? apixaban (ELIQUIS) 5 MG TABS tablet Take 5 mg by mouth 2 (two) times daily. 01/23/2021: Prescribed by Hassell Done, NP  ? aspirin 81 MG EC tablet Take 81 mg by mouth daily.    ? atorvastatin (LIPITOR) 40 MG tablet Take 1 tablet (40 mg total) by mouth daily.   ? Azelastine-Fluticasone 137-50 MCG/ACT SUSP Place 1 spray into the nose every 12 (twelve) hours.   ? Calcium Carbonate Antacid 600 MG chewable tablet Chew 1 tablet by mouth daily.   ? carvedilol (COREG) 6.25 MG tablet Take 1 tablet (6.25 mg total) by mouth 2 (two) times daily with a meal.   ? cetirizine (ZYRTEC) 10 MG tablet Take 1 tablet (10 mg total) by mouth daily.   ? Cyanocobalamin (B-12) 2500 MCG TABS Take 2,500 mcg by mouth daily.   ? GLUCOSAMINE HCL PO Take 1,500 mg by mouth daily.    ? glucose blood (ONETOUCH VERIO) test strip Use as instructed to check sugar daily   ? ipratropium (ATROVENT) 0.06 % nasal spray Place 2 sprays into both nostrils 4 (four) times daily.   ? losartan (COZAAR) 50 MG tablet TAKE 1 TABLET(50 MG) BY MOUTH DAILY   ? magnesium oxide (MAG-OX) 400 MG tablet Take 500 mg by mouth 2 (two) times daily.   ? metFORMIN (GLUCOPHAGE-XR) 500 MG 24 hr tablet Take 2 tablets (1,000 mg total) by mouth daily.   ? pantoprazole (PROTONIX) 40 MG tablet TAKE 1 TABLET BY MOUTH EVERY DAY   ? potassium chloride SA (KLOR-CON) 20 MEQ tablet Take 20 mEq by mouth daily. 01/23/2021: Prescribed by Hassell Done, NP ?  ? sodium chloride (OCEAN) 0.65 % SOLN nasal  spray Place 1 spray into both nostrils as needed for congestion.   ? torsemide (DEMADEX) 20 MG tablet Take 20 mg by mouth 2 (two) times daily. 01/23/2021: Prescribed by Dr. Nehemiah Massed ?  ? triamcinolone ointment (KENALOG) 0.1 % Apply 1 application topically 2 (two) times daily. As directed   ? ?No facility-administered encounter medications on file as of 02/27/2022.  ? ? ?Care Gaps: ?Foot Exam ?Shingrix Vaccine ?COVID-19 Vaccine ? ?  ?Star Rating Drugs: ?Atorvastatin 40 mg last filled 12/11/2021 90 day supply at [place] [facility] ?Metformin 500 mg last filled 12/11/2021 90 day supply at [place]  ?Losartan 50 mg last filled 01/10/2022 90 day supply at [facility]. ?  ?Medication Fill Gaps: ?None ID ? ?Recent Relevant Labs: ?Lab Results  ?Component Value Date/Time  ? HGBA1C 6.5 (H) 12/11/2021 10:51 AM  ? HGBA1C 6.3 (A) 06/25/2021 08:22 AM  ? HGBA1C 7.1 (A) 02/23/2021 01:53 PM  ? HGBA1C 8.2 (H) 10/04/2020 09:05 AM  ?  ?Kidney Function ?Lab Results  ?Component Value Date/Time  ? CREATININE 1.85 (H) 12/11/2021 10:51 AM  ? CREATININE 2.02 (H) 06/25/2021 08:41 AM  ? GFRNONAA 52 (L) 11/21/2020 03:05 PM  ? GFRAA 60 11/21/2020 03:05 PM  ? ? ?Current antihyperglycemic regimen:  ?Metformin XR 500 mg 1 tablets daily ? ?What recent  interventions/DTPs have been made to improve glycemic control:  ?Patient reports he takes metformin once daily. ? ?Have there been any recent hospitalizations or ED visits since last visit with CPP? No ? ?Patient denies hypoglycemic symptoms, including Pale, Sweaty, Shaky, Hungry, Nervous/irritable, and Vision changes ? ?Patient denies hyperglycemic symptoms, including blurry vision, excessive thirst, fatigue, polyuria, and weakness ? ?How often are you checking your blood sugar? once daily ? ?What are your blood sugars ranging?  ?Fasting: 110-120 ?Before meals: None ?After meals: None ?Bedtime: None ? ?During the week, how often does your blood glucose drop below 70? Never ? ?Are you  checking your feet daily/regularly?  ? Yes, Patient denies any pain,numbness or tingling sensation in his feet. ? ?Adherence Review: ?Is the patient currently on a STATIN medication? Yes ?Is the patient currently on ACE/ARB medication? Yes ?Does the patient have >5 day gap between last estimated fill dates? No ? ?Telephone follow up appointment with care management team member scheduled for:  04/30/2022 at 3:45 PM ? ?Bessie Kellihan,CPA ?Clinical Pharmacist Assistant ?[phone]  ? ? ?

## 2022-03-11 ENCOUNTER — Other Ambulatory Visit: Payer: Self-pay | Admitting: Family Medicine

## 2022-03-11 DIAGNOSIS — E119 Type 2 diabetes mellitus without complications: Secondary | ICD-10-CM

## 2022-03-25 ENCOUNTER — Telehealth: Payer: Self-pay

## 2022-03-25 ENCOUNTER — Ambulatory Visit: Payer: Self-pay | Admitting: *Deleted

## 2022-03-25 NOTE — Progress Notes (Signed)
? ? ?Chronic Care Management ?Pharmacy Assistant  ? ?Name: Taylor [name]  MRN: [id number] DOB: September 26, 1941 ? ?Reason for Encounter: Diabetes Disease State Call. ?  ?Recent office visits:  ?No recent office visit ? ?Recent consult visits:  ?No recent consult visit ? ?Hospital visits:  ?None in previous 6 months ? ?Medications: ?Outpatient Encounter Medications as of 03/25/2022  ?Medication Sig Note  ? [START ON 09/25/2022] allopurinol (ZYLOPRIM) 300 MG tablet Take 0.5 tablets (150 mg total) by mouth daily.   ? apixaban (ELIQUIS) 5 MG TABS tablet Take 5 mg by mouth 2 (two) times daily. 01/23/2021: Prescribed by Hassell Done, NP  ? aspirin 81 MG EC tablet Take 81 mg by mouth daily.    ? atorvastatin (LIPITOR) 40 MG tablet Take 1 tablet (40 mg total) by mouth daily.   ? Azelastine-Fluticasone 137-50 MCG/ACT SUSP Place 1 spray into the nose every 12 (twelve) hours.   ? Calcium Carbonate Antacid 600 MG chewable tablet Chew 1 tablet by mouth daily.   ? carvedilol (COREG) 6.25 MG tablet Take 1 tablet (6.25 mg total) by mouth 2 (two) times daily with a meal.   ? cetirizine (ZYRTEC) 10 MG tablet Take 1 tablet (10 mg total) by mouth daily.   ? Cyanocobalamin (B-12) 2500 MCG TABS Take 2,500 mcg by mouth daily.   ? GLUCOSAMINE HCL PO Take 1,500 mg by mouth daily.    ? glucose blood (ONETOUCH VERIO) test strip Use as instructed to check sugar daily   ? ipratropium (ATROVENT) 0.06 % nasal spray Place 2 sprays into both nostrils 4 (four) times daily.   ? losartan (COZAAR) 50 MG tablet TAKE 1 TABLET(50 MG) BY MOUTH DAILY   ? magnesium oxide (MAG-OX) 400 MG tablet Take 500 mg by mouth 2 (two) times daily.   ? metFORMIN (GLUCOPHAGE-XR) 500 MG 24 hr tablet TAKE 2 TABLETS(1000 MG) BY MOUTH DAILY   ? pantoprazole (PROTONIX) 40 MG tablet TAKE 1 TABLET BY MOUTH EVERY DAY   ? potassium chloride SA (KLOR-CON) 20 MEQ tablet Take 20 mEq by mouth daily. 01/23/2021: Prescribed by Hassell Done, NP ?  ? sodium chloride (OCEAN) 0.65 % SOLN nasal spray Place  1 spray into both nostrils as needed for congestion.   ? torsemide (DEMADEX) 20 MG tablet Take 20 mg by mouth 2 (two) times daily. 01/23/2021: Prescribed by Dr. Nehemiah Massed ?  ? triamcinolone ointment (KENALOG) 0.1 % Apply 1 application topically 2 (two) times daily. As directed   ? ?No facility-administered encounter medications on file as of 03/25/2022.  ? ?Care Gaps: ?Foot Exam ?Shingrix Vaccine ?COVID-19 Vaccine ?  ?  ?Star Rating Drugs: ?Atorvastatin 40 mg last filled 12/11/2021 90 day supply at [place] ?Metformin 500 mg last filled 12/11/2021 90 day supply at [place]  ?Losartan 50 mg last filled 01/10/2022 90 day supply at [facility]. ?  ?Medication Fill Gaps: ?None ID ? ?Recent Relevant Labs: ?Lab Results  ?Component Value Date/Time  ? HGBA1C 6.5 (H) 12/11/2021 10:51 AM  ? HGBA1C 6.3 (A) 06/25/2021 08:22 AM  ? HGBA1C 7.1 (A) 02/23/2021 01:53 PM  ? HGBA1C 8.2 (H) 10/04/2020 09:05 AM  ?  ?Kidney Function ?Lab Results  ?Component Value Date/Time  ? CREATININE 1.85 (H) 12/11/2021 10:51 AM  ? CREATININE 2.02 (H) 06/25/2021 08:41 AM  ? GFRNONAA 52 (L) 11/21/2020 03:05 PM  ? GFRAA 60 11/21/2020 03:05 PM  ? ? ?Current antihyperglycemic regimen:  ?Metformin XR 500 mg 1 tablets daily ? ?What recent interventions/DTPs have  been made to improve glycemic control:  ?None ID ? ?Have there been any recent hospitalizations or ED visits since last visit with CPP? No ? ?Patient denies hypoglycemic symptoms, including Pale, Sweaty, Shaky, Hungry, Nervous/irritable, and Vision changes ? ?Patient reports hyperglycemic symptoms, including polyuria and weakness ?  ? Patient reports in the last three days he has been feeling lightheaded, dizzy as if he is going to pass out.Patient reports he will be walking and all of sudden he will begin to feel lightheaded to the point that he needs to hold on to something so he doesn't fall.Patient reports his blood sugar was 125, Blood pressure was 135/78 and oxygen was  99%. Patient confirm he drinks 60 ounces of water daily.Inform patient to schedule appointment with his PCP to be seen.Patient reports when he calls to schedule appointment sometimes his PCP can not see him or they will push his appointment off for a few weeks. Notified Clinical pharmacist. ? ? Per Clinical pharmacist, patient needs immediate visit with primary care or go to Urgent Care. Patient states he will call his PCP office to schedule appointment , and if they are unable to schedule appointment today he may go to urgent care. ?  ?How often are you checking your blood sugar? once daily ? ?What are your blood sugars ranging?  ?Fasting: 125 ?Before meals: None ID ?After meals: None ID ?Bedtime: None ID ? ?During the week, how often does your blood glucose drop below 70? Never ? ?Are you checking your feet daily/regularly?  ?   Yes, Patient denies any pain,numbness or tingling sensation in his feet. ? ?Adherence Review: ?Is the patient currently on a STATIN medication? Yes ?Is the patient currently on ACE/ARB medication? Yes ?Does the patient have >5 day gap between last estimated fill dates? Yes ? ?Telephone follow up appointment with care management team member scheduled for:  04/30/2022 at 3:45 PM ? ?Bessie Kellihan,CPA ?Clinical Pharmacist Assistant ?[phone]  ? ? ?

## 2022-03-25 NOTE — Telephone Encounter (Signed)
?  Chief Complaint: dizziness feels like passing out  ?Symptoms: no dizziness now. Sx dizziness, weak upon standing, feels like going to pass out. BP 135/78 CBG 125 O2 sat at 99% ?Frequency: x 3 days since Saturday  ?Pertinent Negatives: Patient denies dizziness now no weakness on either side of body .  ?Disposition: '[]'$ ED /'[]'$ Urgent Care (no appt availability in office) / '[x]'$ Appointment(In office/virtual)/ '[]'$  [facility]/ '[]'$ Home Care/ '[]'$ Refused Recommended Disposition /'[]'$ [facility]/ '[]'$  Follow-up with PCP ?Additional Notes:  ?Na ? ? Reason for Disposition ? [1] NO dizziness now AND [2] one or more stroke risk factors (i.e., hypertension, diabetes, prior stroke/TIA/heart attack) ? ?Answer Assessment - Initial Assessment Questions ?1. DESCRIPTION: "Describe your dizziness." ?    Feel weak when standing and like passing out  ?2. VERTIGO: "Do you feel like either you or the room is spinning or tilting?"  ?    No  ?3. LIGHTHEADED: "Do you feel lightheaded?" (e.g., somewhat faint, woozy, weak upon standing) ?    Weak upon standing  ?4. SEVERITY: "How bad is it?"  "Can you walk?" ?  - MILD: Feels slightly dizzy and unsteady, but is walking normally. ?  - MODERATE: Feels unsteady when walking, but not falling; interferes with normal activities (e.g., school, work). ?  - SEVERE: Unable to walk without falling, or requires assistance to walk without falling. ?    Moderate to severe at times not now  ?5. ONSET:  "When did the dizziness begin?" ?    3 days ago Saturday  ?6. AGGRAVATING FACTORS: "Does anything make it worse?" (e.g., standing, change in head position) ?    Standing ?7. CAUSE: "What do you think is causing the dizziness?" ?    Not sure  ?8. RECURRENT SYMPTOM: "Have you had dizziness before?" If Yes, ask: "When was the last time?" "What happened that time?" ?    Yes but not this way  ?9. OTHER SYMPTOMS: "Do you have any other symptoms?" (e.g., headache, weakness, numbness, vomiting, earache) ?     Weakness only when standing  ?10. PREGNANCY: "Is there any chance you are pregnant?" "When was your last menstrual period?" ?      na ? ?Protocols used: Dizziness - Vertigo-A-AH ? ?

## 2022-03-25 NOTE — Progress Notes (Signed)
?  ? ?I,[name],acting as a scribe for [employer], PA-C.,have documented all relevant documentation on the behalf of Mardene Speak, PA-C,as directed by  [employer], PA-C while in the presence of [employer], PA-C.  ? ?Established patient visit ? ? ?Patient: Taylor [name]   DOB: 10/12/1941   81 y.o. Male  MRN: [id number] ?Visit Date: 03/26/2022 ? ?Today's healthcare provider: Mardene Speak, PA-C  ? ?Chief Complaint  ?Patient presents with  ? Dizziness  ? ?Subjective  ?  ?Dizziness ?This is a new problem. Episode onset: 3 days ago. The problem occurs every several days. The problem has been unchanged. Associated symptoms include fatigue and weakness (when standing). Pertinent negatives include no abdominal pain, chest pain, congestion, coughing, headaches, nausea, sore throat, visual change or vomiting. Associated symptoms comments: Faint feeling. The symptoms are aggravated by standing. He has tried nothing for the symptoms.   ?.DIZZINESS ?Duration: days ?Description of symptoms: fainting and passing out ?Duration of episode: 82' ?Dizziness frequency: once a day ?Provoking factors: none ?Aggravating factors:  none ?Triggered by rolling over in bed: no ?Triggered by bending over: no ?Aggravated by head movement: no ?Aggravated by exertion, coughing, loud noises: no ?Recent head injury: no ?Recent or current viral symptoms: no ?History of vasovagal episodes: no ?Nausea: no ?Vomiting: no ?Tinnitus: no ?Hearing loss: yes but chronic ?Aural fullness: no ?Headache: no ?Photophobia/phonophobia: no ?Unsteady gait: no ?Postural instability: no ?Diplopia, dysarthria, dysphagia or weakness: no ?Related to exertion: no ?Pallor: no ?Diaphoresis: no ?Dyspnea: no ?Chest pain: no ?Hx of Afib on a blood thinner ?Hx of stroke and HA ? ?Medications: ?Outpatient Medications Prior to Visit  ?Medication Sig  ? [START ON 09/25/2022] allopurinol (ZYLOPRIM) 300 MG tablet Take 0.5 tablets (150 mg total) by mouth daily.  ?  aspirin 81 MG EC tablet Take 81 mg by mouth daily.   ? atorvastatin (LIPITOR) 40 MG tablet Take 1 tablet (40 mg total) by mouth daily.  ? Azelastine-Fluticasone 137-50 MCG/ACT SUSP Place 1 spray into the nose every 12 (twelve) hours.  ? Calcium Carbonate Antacid 600 MG chewable tablet Chew 1 tablet by mouth daily.  ? carvedilol (COREG) 6.25 MG tablet Take 1 tablet (6.25 mg total) by mouth 2 (two) times daily with a meal.  ? cetirizine (ZYRTEC) 10 MG tablet Take 1 tablet (10 mg total) by mouth daily.  ? Cyanocobalamin (B-12) 2500 MCG TABS Take 2,500 mcg by mouth daily.  ? GLUCOSAMINE HCL PO Take 1,500 mg by mouth daily.   ? glucose blood (ONETOUCH VERIO) test strip Use as instructed to check sugar daily  ? ipratropium (ATROVENT) 0.06 % nasal spray Place 2 sprays into both nostrils 4 (four) times daily.  ? losartan (COZAAR) 50 MG tablet TAKE 1 TABLET(50 MG) BY MOUTH DAILY  ? magnesium oxide (MAG-OX) 400 MG tablet Take 500 mg by mouth 2 (two) times daily.  ? metFORMIN (GLUCOPHAGE-XR) 500 MG 24 hr tablet TAKE 2 TABLETS(1000 MG) BY MOUTH DAILY  ? pantoprazole (PROTONIX) 40 MG tablet TAKE 1 TABLET BY MOUTH EVERY DAY  ? potassium chloride SA (KLOR-CON) 20 MEQ tablet Take 20 mEq by mouth daily.  ? sodium chloride (OCEAN) 0.65 % SOLN nasal spray Place 1 spray into both nostrils as needed for congestion.  ? torsemide (DEMADEX) 20 MG tablet Take 20 mg by mouth 2 (two) times daily.  ? triamcinolone ointment (KENALOG) 0.1 % Apply 1 application topically 2 (two) times daily. As directed  ? apixaban (ELIQUIS) 5 MG TABS tablet  Take 5 mg by mouth 2 (two) times daily.  ? ?No facility-administered medications prior to visit.  ? ? ?Review of Systems  ?Constitutional:  Positive for fatigue.  ?HENT:  Negative for congestion and sore throat.   ?Respiratory:  Negative for cough.   ?Cardiovascular:  Negative for chest pain.  ?Gastrointestinal:  Negative for abdominal pain, nausea and vomiting.  ?Neurological:  Positive for dizziness and  weakness (when standing). Negative for headaches.  ? ?  Objective  ?  ?BP 113/67 (BP Location: Right Arm, Patient Position: Sitting, Cuff Size: Normal)   Pulse 66   Temp 98 ?F (36.7 ?C) (Oral)   Resp 16   Ht '5\' 10"'$  (1.778 m)   Wt 194 lb 6.4 oz (88.2 kg)   BMI 27.89 kg/m?  ? ?Orthostatic VS for the past 24 hrs (Last 3 readings): ? BP- Lying Pulse- Lying BP- Sitting Pulse- Sitting BP- Standing at 0 minutes Pulse- Standing at 0 minutes  ?03/26/22 1318 126/74 70 119/75 65 110/71 66  ?  ? ?Physical Exam ?Vitals and nursing note reviewed.  ?Constitutional:   ?   Appearance: Normal appearance. He is obese.  ?HENT:  ?   Head: Normocephalic and atraumatic.  ?   Right Ear: There is impacted cerumen.  ?   Left Ear: Tympanic membrane, ear canal and external ear normal.  ?   Nose: Nose normal.  ?   Mouth/Throat:  ?   Mouth: Mucous membranes are moist.  ?   Pharynx: Oropharynx is clear.  ?Eyes:  ?   Extraocular Movements: Extraocular movements intact.  ?   Conjunctiva/sclera: Conjunctivae normal.  ?   Pupils: Pupils are equal, round, and reactive to light.  ?   Comments: Nystagmus present with EOM  ?Cardiovascular:  ?   Rate and Rhythm: Normal rate and regular rhythm.  ?   Pulses: Normal pulses.  ?   Heart sounds: Normal heart sounds.  ?   Comments: ICD in place ?Pulmonary:  ?   Effort: Pulmonary effort is normal.  ?   Breath sounds: Normal breath sounds.  ?Abdominal:  ?   General: Abdomen is flat. Bowel sounds are normal.  ?   Palpations: Abdomen is soft.  ?Musculoskeletal:     ?   General: Normal range of motion.  ?   Cervical back: Normal range of motion.  ?   Right lower leg: No edema.  ?   Left lower leg: No edema.  ?Skin: ?   General: Skin is warm.  ?   Capillary Refill: Capillary refill takes less than 2 seconds.  ?   Findings: No rash.  ?Neurological:  ?   Mental Status: He is alert and oriented to person, place, and time.  ?   Cranial Nerves: No cranial nerve deficit.  ?   Sensory: No sensory deficit.  ?   Motor:  No weakness.  ?   Coordination: Coordination normal.  ?   Gait: Gait normal.  ?   Deep Tendon Reflexes: Reflexes normal.  ?Psychiatric:     ?   Behavior: Behavior normal.     ?   Thought Content: Thought content normal.     ?   Judgment: Judgment normal.  ?  ? ?No results found for any visits on 03/26/22. ? Assessment & Plan  ?  ? ?Problem List Items Addressed This Visit   ? ?  ? Other  ? Dizziness - Primary  ?  Unclear etiology. Could be due to positional  hypotension, there is a difference between BP - lying and BP -standing  but HR does not increase. He is on a pacemaker, Has hx of Afib , bradycardia, has cardiac defibrillator in place.  Normal neuro exam including cerebellar function.  Presence of nystagmus post CVA.  Labs ordered.  Follow-up in 3 days ? ?  ?  ? Relevant Orders  ? Hemoglobin A1c  ? CBC with Differential/Platelet  ? Comprehensive metabolic panel  ? TSH  ?  ?Return in about 3 days (around 03/29/2022) for as scheduled.  ?   ?  ?The patient was advised to call back or seek an in-person evaluation if the symptoms worsen or if the condition fails to improve as anticipated. ? ?I discussed the assessment and treatment plan with the patient. The patient was provided an opportunity to ask questions and all were answered. The patient agreed with the plan and demonstrated an understanding of the instructions. ? ?The entirety of the information documented in the History of Present Illness, Review of Systems and Physical Exam were personally obtained by me. Portions of this information were initially documented by the CMA and reviewed by me for thoroughness and accuracy.  ? ?Portions of this note were created using dictation software and may contain typographical errors.  ? ?Mardene Speak, PA-C  ?[place] ?[phone] (phone) ?[phone] (fax) ? ?[facility]  ?

## 2022-03-25 NOTE — Telephone Encounter (Signed)
Needs office visit.

## 2022-03-26 ENCOUNTER — Encounter: Payer: Self-pay | Admitting: Physician Assistant

## 2022-03-26 ENCOUNTER — Ambulatory Visit (INDEPENDENT_AMBULATORY_CARE_PROVIDER_SITE_OTHER): Payer: Medicare Other | Admitting: Physician Assistant

## 2022-03-26 VITALS — BP 113/67 | HR 66 | Temp 98.0°F | Resp 16 | Ht 70.0 in | Wt 194.4 lb

## 2022-03-26 DIAGNOSIS — E119 Type 2 diabetes mellitus without complications: Secondary | ICD-10-CM | POA: Diagnosis not present

## 2022-03-26 DIAGNOSIS — R42 Dizziness and giddiness: Secondary | ICD-10-CM | POA: Diagnosis not present

## 2022-03-26 DIAGNOSIS — I10 Essential (primary) hypertension: Secondary | ICD-10-CM | POA: Diagnosis not present

## 2022-03-26 DIAGNOSIS — R531 Weakness: Secondary | ICD-10-CM | POA: Diagnosis not present

## 2022-03-26 DIAGNOSIS — R5383 Other fatigue: Secondary | ICD-10-CM | POA: Diagnosis not present

## 2022-03-26 NOTE — Assessment & Plan Note (Addendum)
Unclear etiology. Could be due to positional hypotension, there is a difference between BP - lying and BP -standing  but HR does not increase. He is on a pacemaker, Has hx of Afib , bradycardia, has cardiac defibrillator in place.  Normal neuro exam including cerebellar function.  Presence of nystagmus post CVA.  Labs ordered.  Follow-up in 3 days ?

## 2022-03-26 NOTE — Telephone Encounter (Signed)
Seen in office today by Mardene Speak, PA-C. ?

## 2022-03-27 LAB — COMPREHENSIVE METABOLIC PANEL
ALT: 13 IU/L (ref 0–44)
AST: 22 IU/L (ref 0–40)
Albumin/Globulin Ratio: 1.5 (ref 1.2–2.2)
Albumin: 4.3 g/dL (ref 3.7–4.7)
Alkaline Phosphatase: 149 IU/L — ABNORMAL HIGH (ref 44–121)
BUN/Creatinine Ratio: 18 (ref 10–24)
BUN: 31 mg/dL — ABNORMAL HIGH (ref 8–27)
Bilirubin Total: 0.7 mg/dL (ref 0.0–1.2)
CO2: 27 mmol/L (ref 20–29)
Calcium: 9.6 mg/dL (ref 8.6–10.2)
Chloride: 100 mmol/L (ref 96–106)
Creatinine, Ser: 1.77 mg/dL — ABNORMAL HIGH (ref 0.76–1.27)
Globulin, Total: 2.9 g/dL (ref 1.5–4.5)
Glucose: 143 mg/dL — ABNORMAL HIGH (ref 70–99)
Potassium: 3.9 mmol/L (ref 3.5–5.2)
Sodium: 142 mmol/L (ref 134–144)
Total Protein: 7.2 g/dL (ref 6.0–8.5)
eGFR: 39 mL/min/{1.73_m2} — ABNORMAL LOW (ref >59)

## 2022-03-27 LAB — CBC WITH DIFFERENTIAL/PLATELET
Basophils Absolute: 0.1 10*3/uL (ref 0.0–0.2)
Basos: 1 %
EOS (ABSOLUTE): 0.3 10*3/uL (ref 0.0–0.4)
Eos: 3 %
Hematocrit: 45.3 % (ref 37.5–51.0)
Hemoglobin: 15.4 g/dL (ref 13.0–17.7)
Immature Grans (Abs): 0 10*3/uL (ref 0.0–0.1)
Immature Granulocytes: 0 %
Lymphocytes Absolute: 2.2 10*3/uL (ref 0.7–3.1)
Lymphs: 28 %
MCH: 31.6 pg (ref 26.6–33.0)
MCHC: 34 g/dL (ref 31.5–35.7)
MCV: 93 fL (ref 79–97)
Monocytes Absolute: 0.6 10*3/uL (ref 0.1–0.9)
Monocytes: 8 %
Neutrophils Absolute: 4.5 10*3/uL (ref 1.4–7.0)
Neutrophils: 60 %
Platelets: 187 10*3/uL (ref 150–450)
RBC: 4.87 x10E6/uL (ref 4.14–5.80)
RDW: 13.6 % (ref 11.6–15.4)
WBC: 7.6 10*3/uL (ref 3.4–10.8)

## 2022-03-27 LAB — TSH: TSH: 2.01 u[IU]/mL (ref 0.450–4.500)

## 2022-03-27 LAB — HEMOGLOBIN A1C
Est. average glucose Bld gHb Est-mCnc: 140 mg/dL
Hgb A1c MFr Bld: 6.5 % — ABNORMAL HIGH (ref 4.8–5.6)

## 2022-03-28 NOTE — Progress Notes (Signed)
?  ? ?I,Adela Esteban Robinson,acting as a Education administrator for [employer], PA-C.,have documented all relevant documentation on the behalf of Mardene Speak, PA-C,as directed by  [employer], PA-C while in the presence of [employer], PA-C. ? ? ?Established patient visit ? ? ?Patient: Taylor [name]   DOB: 11-Apr-1941   81 y.o. Male  MRN: [id number] ?Visit Date: 03/29/2022 ? ?Today's healthcare provider: Mardene Speak, PA-C  ? ?Chief Complaint  ?Patient presents with  ? Follow-up  ?  Dizziness   ?CC: 3 day follow up dizziness  ?Subjective  ?  ? ?HPI   ? ? Follow-up   ? Additional comments: Dizziness  ? ?  ?  ?Last edited by Alanson Puls, CMA on 03/29/2022  1:41 PM.  ?  ?  ?Follow up for dizziness ? ?The patient was last seen for this 3 days ago for dizziness. All lab work results are within normal limits except a1c - 6.5, in diabetic range. ?Patient reports dizziness was bad yesterday, but okay today. Episodes last about 5 seconds and occurs only when he is standing up or upright.  Went roller skating on Wednesday but felt like he was falling forward so he stopped and removed skates.  Reports no dizziness with lying or sitting and patient continues to drive.   ? ? ?Medications: ?Outpatient Medications Prior to Visit  ?Medication Sig  ? [START ON 09/25/2022] allopurinol (ZYLOPRIM) 300 MG tablet Take 0.5 tablets (150 mg total) by mouth daily.  ? aspirin 81 MG EC tablet Take 81 mg by mouth daily.   ? atorvastatin (LIPITOR) 40 MG tablet Take 1 tablet (40 mg total) by mouth daily.  ? Calcium Carbonate Antacid 600 MG chewable tablet Chew 1 tablet by mouth daily.  ? carvedilol (COREG) 6.25 MG tablet Take 1 tablet (6.25 mg total) by mouth 2 (two) times daily with a meal.  ? cetirizine (ZYRTEC) 10 MG tablet Take 1 tablet (10 mg total) by mouth daily.  ? Cyanocobalamin (B-12) 2500 MCG TABS Take 2,500 mcg by mouth daily.  ? GLUCOSAMINE HCL PO Take 1,500 mg by mouth daily.   ? glucose blood (ONETOUCH VERIO) test strip Use as instructed to  check sugar daily  ? losartan (COZAAR) 50 MG tablet TAKE 1 TABLET(50 MG) BY MOUTH DAILY  ? magnesium oxide (MAG-OX) 400 MG tablet Take 500 mg by mouth 2 (two) times daily.  ? metFORMIN (GLUCOPHAGE-XR) 500 MG 24 hr tablet TAKE 2 TABLETS(1000 MG) BY MOUTH DAILY  ? pantoprazole (PROTONIX) 40 MG tablet TAKE 1 TABLET BY MOUTH EVERY DAY  ? potassium chloride SA (KLOR-CON) 20 MEQ tablet Take 20 mEq by mouth daily.  ? torsemide (DEMADEX) 20 MG tablet Take 20 mg by mouth 2 (two) times daily.  ? triamcinolone ointment (KENALOG) 0.1 % Apply 1 application topically 2 (two) times daily. As directed  ? apixaban (ELIQUIS) 5 MG TABS tablet Take 5 mg by mouth 2 (two) times daily.  ? Azelastine-Fluticasone 137-50 MCG/ACT SUSP Place 1 spray into the nose every 12 (twelve) hours. (Patient not taking: Reported on 03/29/2022)  ? ipratropium (ATROVENT) 0.06 % nasal spray Place 2 sprays into both nostrils 4 (four) times daily. (Patient not taking: Reported on 03/29/2022)  ? sodium chloride (OCEAN) 0.65 % SOLN nasal spray Place 1 spray into both nostrils as needed for congestion. (Patient not taking: Reported on 03/29/2022)  ? ?No facility-administered medications prior to visit.  ? ? ?Review of Systems  ?Respiratory: Negative.    ?Cardiovascular: Negative.   ?  See hpi ?  Objective  ?  ?BP 104/64 (BP Location: Left Arm, Patient Position: Sitting, Cuff Size: Normal)   Pulse 62   Temp 98.5 ?F (36.9 ?C) (Oral)   Resp 14   Wt 196 lb 8 oz (89.1 kg)   SpO2 97%   BMI 28.19 kg/m?  ? ? ?Physical Exam ?Cardiovascular:  ?   Rate and Rhythm: Normal rate and regular rhythm.  ?   Pulses: Normal pulses.  ?   Heart sounds: Normal heart sounds.  ?   Comments: Carotid 1+, right ?Carotid 2+, left ?Has cardiac defibrillator in place. ? ?Pulmonary:  ?   Effort: Pulmonary effort is normal.  ?   Breath sounds: Normal breath sounds.  ?Musculoskeletal:     ?   General: No swelling, tenderness or deformity.  ?   Cervical back: Normal range of motion.  ?   Right lower  leg: No edema.  ?   Left lower leg: No edema.  ?Lymphadenopathy:  ?   Cervical: No cervical adenopathy.  ?Neurological:  ?   Mental Status: He is oriented to person, place, and time.  ?Psychiatric:     ?   Behavior: Behavior normal.     ?   Thought Content: Thought content normal.     ?   Judgment: Judgment normal.  ?  ? ? ?No results found for any visits on 03/29/22. ? Assessment & Plan  ?  ? ?1. Dizziness ?Could be due to orthostatic hypotension vs CAD, patient has an extensive Cardiovascular hx including,  ?US carotid from 05/07/18  showed "Mild bilateral carotid bifurcation plaque resulting in less than ?50% diameter stenosis." ?US carotid was ordered ?Torsemide should be decrease to once daily unless dizziness would not improve, BP would not increased or leg swelling would be noted ? ?FU if symptoms will not improved. ? ?Fu with his PCP or RTC if symptoms worsen ?   ? ?The patient was advised to call back or seek an in-person evaluation if the symptoms worsen or if the condition fails to improve as anticipated. ? ?I discussed the assessment and treatment plan with the patient. The patient was provided an opportunity to ask questions and all were answered. The patient agreed with the plan and demonstrated an understanding of the instructions. ? ?The entirety of the information documented in the History of Present Illness, Review of Systems and Physical Exam were personally obtained by me. Portions of this information were initially documented by the CMA and reviewed by me for thoroughness and accuracy.  ?Portions of this note were created using dictation software and may contain typographical errors.  ? ? ?Mardene Speak, PA-C  ?[place] ?[phone] (phone) ?[phone] (fax) ? ?[facility] ?

## 2022-03-29 ENCOUNTER — Ambulatory Visit (INDEPENDENT_AMBULATORY_CARE_PROVIDER_SITE_OTHER): Payer: Medicare Other | Admitting: Physician Assistant

## 2022-03-29 ENCOUNTER — Encounter: Payer: Self-pay | Admitting: Physician Assistant

## 2022-03-29 VITALS — BP 104/64 | HR 62 | Temp 98.5°F | Resp 14 | Wt 196.5 lb

## 2022-03-29 DIAGNOSIS — R42 Dizziness and giddiness: Secondary | ICD-10-CM

## 2022-04-04 DIAGNOSIS — H02132 Senile ectropion of right lower eyelid: Secondary | ICD-10-CM | POA: Diagnosis not present

## 2022-04-04 DIAGNOSIS — H04563 Stenosis of bilateral lacrimal punctum: Secondary | ICD-10-CM | POA: Diagnosis not present

## 2022-04-04 DIAGNOSIS — H04223 Epiphora due to insufficient drainage, bilateral lacrimal glands: Secondary | ICD-10-CM | POA: Diagnosis not present

## 2022-04-15 ENCOUNTER — Telehealth: Payer: Self-pay

## 2022-04-15 ENCOUNTER — Ambulatory Visit: Payer: Self-pay

## 2022-04-15 NOTE — Telephone Encounter (Signed)
Copied from [place] [id number]. Topic: General - Call Back - No Documentation >> Apr 15, 2022 11:15 AM Erick Blinks wrote: Reason for CRM: Pt called regarding his vein and vascular appt he says, has questions for the clinic  Best contact: [phone]

## 2022-04-15 NOTE — Telephone Encounter (Signed)
     Chief Complaint: Dizzy, almost fainted last Thursday. States "someone was supposed to call me for testing and I never heard from anyone."  Symptoms: "Almost fainted last Thursday."  Frequency: Seen in office 03/29/22. Pertinent Negatives: Patient denies  Disposition: '[]'$ ED /'[]'$ Urgent Care (no appt availability in office) / '[]'$ Appointment(In office/virtual)/ '[]'$  [place] Virtual Care/ '[]'$ Home Care/ '[]'$ Refused Recommended Disposition /'[]'$ [place] [facility]/ '[x]'$  Follow-up with PCP Additional Notes: Please advise pt. Has not heard back on referral/testing.  Answer Assessment - Initial Assessment Questions 1. DESCRIPTION: "Describe your dizziness."     Dizzy 2. LIGHTHEADED: "Do you feel lightheaded?" (e.g., somewhat faint, woozy, weak upon standing)     Faint 3. VERTIGO: "Do you feel like either you or the room is spinning or tilting?" (i.e. vertigo)     No 4. SEVERITY: "How bad is it?"  "Do you feel like you are going to faint?" "Can you stand and walk?"   - MILD: Feels slightly dizzy, but walking normally.   - MODERATE: Feels unsteady when walking, but not falling; interferes with normal activities (e.g., school, work).   - SEVERE: Unable to walk without falling, or requires assistance to walk without falling; feels like passing out now.      Moderate 5. ONSET:  "When did the dizziness begin?"     Beginning of month 6. AGGRAVATING FACTORS: "Does anything make it worse?" (e.g., standing, change in head position)     Teaching a class 7. HEART RATE: "Can you tell me your heart rate?" "How many beats in 15 seconds?"  (Note: not all patients can do this)       No 8. CAUSE: "What do you think is causing the dizziness?"     Unsure 9. RECURRENT SYMPTOM: "Have you had dizziness before?" If Yes, ask: "When was the last time?" "What happened that time?"     No 10. OTHER SYMPTOMS: "Do you have any other symptoms?" (e.g., fever, chest pain, vomiting, diarrhea, bleeding)       No 11. PREGNANCY:  "Is there any chance you are pregnant?" "When was your last menstrual period?"       N/a  Protocols used: Dizziness - Lightheadedness-A-AH

## 2022-04-16 NOTE — Telephone Encounter (Signed)
Patient was seen by Mardene Speak, PA-C on 03/29/2022 for evaluation of dizziness. Order for US Carotid bilateral was placed. Patient has not heard from anyone and needs an update on this appointment. Please call patient with update.

## 2022-04-16 NOTE — Telephone Encounter (Signed)
Left message for pt to return my call.

## 2022-04-24 ENCOUNTER — Ambulatory Visit
Admission: RE | Admit: 2022-04-24 | Discharge: 2022-04-24 | Disposition: A | Discharge status: Home / Self Care | Payer: Medicare Other | Source: Ambulatory Visit | Attending: Physician Assistant | Admitting: Physician Assistant

## 2022-04-24 DIAGNOSIS — R42 Dizziness and giddiness: Secondary | ICD-10-CM | POA: Insufficient documentation

## 2022-04-24 DIAGNOSIS — R55 Syncope and collapse: Secondary | ICD-10-CM | POA: Diagnosis not present

## 2022-04-24 DIAGNOSIS — I10 Essential (primary) hypertension: Secondary | ICD-10-CM | POA: Diagnosis not present

## 2022-04-24 DIAGNOSIS — E785 Hyperlipidemia, unspecified: Secondary | ICD-10-CM | POA: Diagnosis not present

## 2022-04-24 NOTE — Telephone Encounter (Signed)
Appt Date/Time/Location  04/24/2022  3:15 PM  at  [place]

## 2022-04-25 ENCOUNTER — Telehealth: Payer: Self-pay | Admitting: Family Medicine

## 2022-04-25 NOTE — Telephone Encounter (Signed)
Copied from [place] [id number]. Topic: Medicare AWV >> Apr 25, 2022  2:24 PM Cher Nakai R wrote: Reason for CRM:  Left message for patient to call back and schedule Medicare Annual Wellness Visit (AWV) in office.   If unable to come into the office for AWV,  please offer to do virtually or by telephone.  Last AWV: 06/29/2020  Please schedule at anytime with [facility] Advisor.  30 minute appointment for Virtual or phone 45 minute appointment for in office or Initial virtual/phone  Any questions, please contact me at [phone]

## 2022-04-25 NOTE — Telephone Encounter (Signed)
Ok, his last dose should be between 48 and 72 hours(at least 2 days, nor more than 3 days) before the procedure.  Restart 23 hours after surgery.

## 2022-04-25 NOTE — Telephone Encounter (Signed)
Pt is requesting permission to come off the apixaban (ELIQUIS) 5 MG TABS tablet [[id number]]  ENDED . Pt is eye surgery on 05/21/22 CB- [phone]

## 2022-04-25 NOTE — Progress Notes (Signed)
[place] ,   You saw me on 03/29/22 for dizziness Your [place] results are back And showed : 1. Progression of atherosclerosis involving the proximal left ICA with velocities technically in the 50-69% range and just under categorization by velocity criteria of a 70% or greater stenosis. 2. Estimated less than 50% proximal right ICA stenosis.  Per the American Family Physician journal, people who develop carotid artery disease experience coexisting dizziness  No changes need to be made to medications, and no further tests need to be ordered.  Any questions please reach out to the office or message me on MyChart!  Best, Mardene Speak, PA-C

## 2022-04-26 ENCOUNTER — Ambulatory Visit: Payer: Self-pay | Admitting: *Deleted

## 2022-04-26 ENCOUNTER — Other Ambulatory Visit: Payer: Self-pay | Admitting: Physician Assistant

## 2022-04-26 DIAGNOSIS — I6523 Occlusion and stenosis of bilateral carotid arteries: Secondary | ICD-10-CM

## 2022-04-26 NOTE — Telephone Encounter (Signed)
  Chief Complaint: Pt is for eye surgery on May 21, 2022.  He needs an order from Dr. [name] Section for the Eliquis to be stopped 2 days before surgery and 1 day after surgery.  He called a week ago regarding this and never heard back. Symptoms: N/A Frequency: N/A Pertinent Negatives: Patient denies N/A Disposition: '[]'$ ED /'[]'$ Urgent Care (no appt availability in office) / '[]'$ Appointment(In office/virtual)/ '[]'$  [place] [facility]/ '[]'$ Home Care/ '[]'$ Refused Recommended Disposition /'[]'$ [place] Mobile [facility]/  Follow-up with PCP Additional Notes: Please contact pt regarding stopping the Eliquis for eye surgery.  He was also given his lab result message. From [place], [place].      No questions regarding lab work.   Has appt coming up in July with Dr. [name] Section for f/u

## 2022-04-26 NOTE — Telephone Encounter (Signed)
Pt returned call and was given the lab result message from [place], [place] regarding his blood work.  He was also given the ultrasound results from [facility], [place] He is wanting to know what the plan is for the results of the ultrasound.   He is still feeling like he could pass out which is why the test was ordered.  I called pt back and gave him the message from Dr. [name] Section regarding stopping the Eliquis for upcoming eye surgery from 04/25/2022 3:50 PM.

## 2022-04-26 NOTE — Progress Notes (Unsigned)
Per chart review from Cardiology on 10/2021: The patient has been informed of the significant concerns of carotid atherosclerosis disease process progression. Pt was informed that continued treatment of risk factors is the cornerstone to reducing these concerns. The future goals in this treatment, medication modification, antiplatelet therapy, statin therapy, diet and exercise were discussed .  US carotid from 04/24/22 showed progression of atherosclerosis  A referral to vascular surgery was placed.

## 2022-04-26 NOTE — Telephone Encounter (Signed)
Reason for Disposition  [1] Follow-up call to recent contact AND [2] information only call, no triage required  Answer Assessment - Initial Assessment Questions 1. REASON FOR CALL or QUESTION: "What is your reason for calling today?" or "How can I best help you?" or "What question do you have that I can help answer?"     Lab results given  Protocols used: Information Only Call - No Triage-A-AH

## 2022-04-29 ENCOUNTER — Telehealth: Payer: Self-pay

## 2022-04-29 NOTE — Progress Notes (Signed)
Chronic Care Management APPOINTMENT REMINDER   Called FABRICE DYAL, No answer, left message of appointment on 04/30/2022 at 3:45 pm via telephone visit with [name] , Pharm D. Notified to have all medications, supplements, blood pressure and/or blood sugar logs available during appointment and to return call if need to reschedule.  [place] Pharmacist Assistant [phone]

## 2022-04-29 NOTE — Telephone Encounter (Signed)
I spoke with pt and let him know and asks if he had any further question.  He did not and verbalizes understanding.

## 2022-04-30 ENCOUNTER — Ambulatory Visit: Payer: Medicare Other

## 2022-04-30 DIAGNOSIS — E119 Type 2 diabetes mellitus without complications: Secondary | ICD-10-CM

## 2022-04-30 DIAGNOSIS — I4891 Unspecified atrial fibrillation: Secondary | ICD-10-CM

## 2022-04-30 NOTE — Progress Notes (Unsigned)
Chronic Care Management Pharmacy Note  04/30/2022 Name:  Taylor [name] MRN:  [id number] DOB:  Feb 12, 1941  Summary: Patient presents for CCM follow-up.   Recommendations/Changes made from today's visit: -Continue current medications  Plan: CPP follow-up in 4 months.   Subjective: Taylor [name] is an 81 y.o. year old male who is a primary patient of [name], [name], MD.  The CCM team was consulted for assistance with disease management and care coordination needs.    Engaged with patient by telephone for follow up visit in response to provider referral for pharmacy case management and/or care coordination services.   Consent to Services:  The patient was given information about Chronic Care Management services, agreed to services, and gave verbal consent prior to initiation of services.  Please see initial visit note for detailed documentation.   Patient Care Team: Birdie Sons, MD as PCP - General (Family Medicine) Corey Skains, MD as Consulting Physician (Cardiology) Birder Robson, MD as Referring Physician (Ophthalmology) Ottie Glazier, MD as Consulting Physician (Pulmonary Disease) Germaine Pomfret, [facility] (Pharmacist)  Recent office visits: 03/29/22: Patient presented to Mardene Speak, PA-C for dizziness. 12/11/21: Patient presented to Dr. Ky Barban for follow-up.   Recent consult visits: 09/27/21: Patient presented to Dr. Lanora Manis (Nephrology) for follow-up.  05/14/21: Patient preesnted to Dr. Nehemiah Massed (Cardiology) for follow-up.  03/01/21: Patient presented to Dr. Nehemiah Massed (Cardiology) for follow-up. Carvedilol 6.25 mg twice daily  11/30/20: Patient presented to Hassell Done, NP (Cardiology) for follow-up. Eliquis 5 mg twice daily started for anticoagulation.   Hospital visits: None in previous 6 months   Objective:  Lab Results  Component Value Date   CREATININE 1.77 (H) 03/26/2022   BUN 31 (H) 03/26/2022   GFRNONAA 52 (L) 11/21/2020   GFRAA 60 11/21/2020    NA 142 03/26/2022   K 3.9 03/26/2022   CALCIUM 9.6 03/26/2022   CO2 27 03/26/2022   GLUCOSE 143 (H) 03/26/2022    Lab Results  Component Value Date/Time   HGBA1C 6.5 (H) 03/26/2022 02:03 PM   HGBA1C 6.5 (H) 12/11/2021 10:51 AM    Last diabetic Eye exam:  Lab Results  Component Value Date/Time   HMDIABEYEEXA No Retinopathy 01/07/2022 12:00 AM    Last diabetic Foot exam: No results found for: HMDIABFOOTEX   Lab Results  Component Value Date   CHOL 112 12/11/2021   HDL 38 (L) 12/11/2021   LDLCALC 54 12/11/2021   TRIG 107 12/11/2021   CHOLHDL 2.9 12/11/2021       Latest Ref Rng & Units 03/26/2022    2:03 PM 12/11/2021   10:51 AM 06/25/2021    8:41 AM  Hepatic Function  Total Protein 6.0 - 8.5 g/dL 7.2   7.3   7.2    Albumin 3.7 - 4.7 g/dL 4.3   4.4   4.0    AST 0 - 40 IU/L '22   24   22    ' ALT 0 - 44 IU/L '13   16   12    ' Alk Phosphatase 44 - 121 IU/L 149   168   132    Total Bilirubin 0.0 - 1.2 mg/dL 0.7   0.5   1.0      Lab Results  Component Value Date/Time   TSH 2.010 03/26/2022 02:03 PM   TSH 1.331 11/23/2019 04:58 AM   TSH 3.430 05/15/2015 09:08 AM       Latest Ref Rng & Units 03/26/2022    2:03 PM 12/11/2021  10:51 AM 06/25/2021    8:41 AM  CBC  WBC 3.4 - 10.8 x10E3/uL 7.6   7.7   7.2    Hemoglobin 13.0 - 17.7 g/dL 15.4   16.3   14.9    Hematocrit 37.5 - 51.0 % 45.3   47.5   45.1    Platelets 150 - 450 x10E3/uL 187   160   124      Lab Results  Component Value Date/Time   VD25OH 46.58 04/19/2020 04:16 PM   VD25OH 47.4 05/15/2015 09:08 AM    Clinical ASCVD: Yes  The ASCVD Risk score (Arnett DK, et al., 2019) failed to calculate for the following reasons:   The patient has a prior MI or stroke diagnosis       03/29/2022    2:01 PM 12/11/2021    9:32 AM 06/25/2021    8:24 AM  Depression screen PHQ 2/9  Decreased Interest '1 2 2  ' Down, Depressed, Hopeless '1 2 2  ' PHQ - 2 Score '2 4 4  ' Altered sleeping 0 0 2  Tired, decreased energy '3 2 2  ' Change in  appetite 0 0 0  Feeling bad or failure about yourself  1 0 0  Trouble concentrating 0 0 1  Moving slowly or fidgety/restless 0 0 0  Suicidal thoughts 0 0 0  PHQ-9 Score '6 6 9  ' Difficult doing work/chores Not difficult at all Not difficult at all Not difficult at all    Social History   Tobacco Use  Smoking Status Former   Packs/day: 1.00   Types: Cigarettes   Quit date: 11/26/1999   Years since quitting: 22.4  Smokeless Tobacco Never   BP Readings from Last 3 Encounters:  03/29/22 104/64  03/26/22 113/67  12/11/21 110/64   Pulse Readings from Last 3 Encounters:  03/29/22 62  03/26/22 66  12/11/21 68   Wt Readings from Last 3 Encounters:  03/29/22 196 lb 8 oz (89.1 kg)  03/26/22 194 lb 6.4 oz (88.2 kg)  12/11/21 195 lb (88.5 kg)   BMI Readings from Last 3 Encounters:  03/29/22 28.19 kg/m  03/26/22 27.89 kg/m  12/11/21 27.98 kg/m    Assessment/Interventions: Review of patient past medical history, allergies, medications, health status, including review of consultants reports, laboratory and other test data, was performed as part of comprehensive evaluation and provision of chronic care management services.   SDOH:  (Social Determinants of Health) assessments and interventions performed: Yes    SDOH Screenings   Alcohol Screen: Low Risk    Last Alcohol Screening Score (AUDIT): 0  Depression (PHQ2-9): Medium Risk   PHQ-2 Score: 6  Financial Resource Strain: Low Risk    Difficulty of Paying Living Expenses: Not hard at all  Food Insecurity: Not on file  Housing: Not on file  Physical Activity: Not on file  Social Connections: Not on file  Stress: Not on file  Tobacco Use: Medium Risk   Smoking Tobacco Use: Former   Smokeless Tobacco Use: Never   Passive Exposure: Not on file  Transportation Needs: Not on file    [place]  Allergies  Allergen Reactions   Amlodipine Besylate Swelling    Had a reaction when taking with colcrys    Crestor  [Rosuvastatin]     Muscle cramps and pain   Rocephin [Ceftriaxone]     unknown    Medications Reviewed Today     Reviewed by Alanson Puls, CMA (Certified Medical Assistant) on 03/29/22 at [phone]  Med List Status: <None>   Medication Order Taking? Sig Documenting Provider Last Dose Status Informant  allopurinol (ZYLOPRIM) 300 MG tablet [id number] Yes Take 0.5 tablets (150 mg total) by mouth daily. Birdie Sons, MD Taking Active   apixaban (ELIQUIS) 5 MG TABS tablet [id number]  Take 5 mg by mouth 2 (two) times daily. [provider]  Expired 12/11/21 2359            Med Note Michaelle Birks, [place] Jan 23, 2021  2:10 PM) Prescribed by Hassell Done, NP  aspirin 81 MG EC tablet [id number] Yes Take 81 mg by mouth daily.  [provider] Taking Active Self           Med Note Michaelle Birks, Taeya Theall A   Wed Jan 24, 2021  8:17 AM)    atorvastatin (LIPITOR) 40 MG tablet [id number] Yes Take 1 tablet (40 mg total) by mouth daily. Myles Gip, DO Taking Active   Azelastine-Fluticasone 137-50 MCG/ACT SUSP [id number] No Place 1 spray into the nose every 12 (twelve) hours.  Patient not taking: Reported on 03/29/2022   Myles Gip, DO Not Taking Active   Calcium Carbonate Antacid 600 MG chewable tablet [id number] Yes Chew 1 tablet by mouth daily. [provider] Taking Active Self  carvedilol (COREG) 6.25 MG tablet [id number] Yes Take 1 tablet (6.25 mg total) by mouth 2 (two) times daily with a meal. [name], [name], MD Taking Active   cetirizine (ZYRTEC) 10 MG tablet [id number] Yes Take 1 tablet (10 mg total) by mouth daily. Myles Gip, DO Taking Active   Cyanocobalamin (B-12) 2500 MCG TABS [id number] Yes Take 2,500 mcg by mouth daily. [provider] Taking Active   GLUCOSAMINE HCL PO [id number] Yes Take 1,500 mg by mouth daily.  [provider] Taking Active Self  glucose blood (ONETOUCH VERIO) test strip [id number] Yes Use as instructed to check  sugar daily [name], [name], MD Taking Active Self  ipratropium (ATROVENT) 0.06 % nasal spray [id number] No Place 2 sprays into both nostrils 4 (four) times daily.  Patient not taking: Reported on 03/29/2022   Gwyneth Sprout, FNP Not Taking Active   losartan (COZAAR) 50 MG tablet [id number] Yes TAKE 1 TABLET(50 MG) BY MOUTH DAILY Birdie Sons, MD Taking Active   magnesium oxide (MAG-OX) 400 MG tablet [id number] Yes Take 500 mg by mouth 2 (two) times daily. [provider] Taking Active Self  metFORMIN (GLUCOPHAGE-XR) 500 MG 24 hr tablet [id number] Yes TAKE 2 TABLETS(1000 MG) BY MOUTH DAILY Birdie Sons, MD Taking Active   pantoprazole (PROTONIX) 40 MG tablet [id number] Yes TAKE 1 TABLET BY MOUTH EVERY DAY Birdie Sons, MD Taking Active   potassium chloride SA (KLOR-CON) 20 MEQ tablet [id number] Yes Take 20 mEq by mouth daily. [provider] Taking Active            Med Note Michaelle Birks, [place] Jan 23, 2021  2:10 PM) Prescribed by Hassell Done, NP   sodium chloride (OCEAN) 0.65 % SOLN nasal spray [id number] No Place 1 spray into both nostrils as needed for congestion.  Patient not taking: Reported on 03/29/2022   Gwyneth Sprout, FNP Not Taking Active   torsemide (DEMADEX) 20 MG tablet [id number] Yes Take 20 mg by mouth 2 (two) times daily. [provider] Taking Active            Med Note Michaelle Birks, Marjorie Lussier A   Tue Jan 23, 2021  2:10 PM) Prescribed by Dr. Nehemiah Massed   triamcinolone ointment (KENALOG) 0.1 % [id number] Yes Apply 1 application topically 2 (two) times daily. As directed Birdie Sons, MD Taking Active             Patient Active Problem List   Diagnosis Date Noted   Dizziness 03/26/2022   Aortic atherosclerosis ([place]) 05/08/2021   Diabetes mellitus without complication ([place]) 23/30/0762   Diarrhea 09/12/2020   Acute kidney injury superimposed on CKD ([place]) 08/10/2020   Hypomagnesemia 04/26/2020   Hypocalcemia 04/19/2020   Numbness and  tingling in both hands 04/19/2020   Atrial fibrillation ([place]) 04/19/2020   Other specified interstitial pulmonary diseases ([place]) 12/06/2019   Abdominal pain    Pancreatitis 11/22/2019   CAP (community acquired pneumonia) 11/13/2019   Hyperuricemia 03/31/2019   Numbness 05/07/2018   Benign colonic polyp 03/24/2017   Tibialis anterior tenosynovitis 12/21/2015   Pleural nodule 07/27/2015   Psoriatic arthritis ([place]) 05/12/2015   Restrictive lung disease 05/12/2015   Barrett esophagus 03/30/2015   Ache in joint 03/30/2015   Bradycardia 03/30/2015   CAD (coronary artery disease) 03/30/2015   Cardiac defibrillator in place 03/30/2015   Chronic kidney disease (CKD), stage III (moderate) ([place]) 03/30/2015   Cluster headache syndrome 03/30/2015   Degeneration of lumbar or lumbosacral intervertebral disc 03/30/2015   Gout 03/30/2015   Personal history of malignant neoplasm of bladder 03/30/2015   Hypercholesteremia 03/30/2015   LBP (low back pain) 03/30/2015   Personal history of malignant melanoma of skin 03/30/2015   Muscle ache 03/30/2015   Arthritis of knee, degenerative 03/30/2015   Psoriasis 03/30/2015   Obstructive sleep apnea 03/30/2015   Chronic venous insufficiency 03/30/2015   History of abdominal aortic aneurysm (AAA) 06/24/2013   History of CVA (cerebrovascular accident) 06/24/2013   Cardiomyopathy, ischemic 06/24/2013   History of AAA (abdominal aortic aneurysm) repair 06/24/2013   Essential (primary) hypertension 08/17/2011   Arrhythmia, sinus node 08/17/2011   History of ventricular fibrillation 26/33/3545   Systolic CHF with reduced left ventricular function, NYHA class 3 ([place]) 02/26/2010   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 04/27/2007   Cardiac pacemaker in situ 10/10/2006    Immunization History  Administered Date(s) Administered   Fluad Quad(high Dose 65+) 10/04/2020   Influenza Split 09/18/2011   Influenza, High Dose Seasonal PF 08/16/2014,  10/09/2016, 10/09/2017, 08/26/2018   Influenza,inj,Quad PF,6+ Mos 12/20/2015   PFIZER(Purple Top)SARS-COV-2 Vaccination 02/24/2020, 03/21/2020   Pneumococcal Conjugate-13 05/09/2014   Pneumococcal Polysaccharide-23 05/12/2015    Conditions to be addressed/monitored:  Hypertension, Hyperlipidemia, Diabetes, Atrial Fibrillation, Heart Failure, Coronary Artery Disease, Chronic Kidney Disease and Gout  There are no care plans that you recently modified to display for this patient.   Medication Assistance: None required.  Patient affirms current coverage meets needs.  Compliance/Adherence/Medication fill history: Care Gaps: Ophthalmology Exam  Shingrix, Covid, Pneumonia Vaccines   Star-Rating Drugs: Atorvastatin 40 mg: LF 12/11/21 for 90-DS Losartan 50 mg: LF 01/10/22 for 90-DS Metformin XR: LF 12/11/21 for 90-DS  Patient's preferred pharmacy is:  [id number] - [address], [address] [place] [address] Phone: [phone] Fax: [phone]   Uses pill box? Yes Pt endorses 100% compliance  We discussed: Current pharmacy is preferred with insurance plan and patient is satisfied with pharmacy services Patient decided to: Continue current medication management strategy  Care Plan and Follow Up Patient Decision:  Patient  agrees to Care Plan and Follow-up.  Plan: Telephone follow up appointment with care management team member scheduled for:  04/30/2022 at 3:45 PM  [name], PharmD, [place], CPP  Clinical Pharmacist Practitioner  [facility] [phone]  Current Barriers:  Unable to independently afford treatment regimen Unable to achieve control of diabetes   Pharmacist Clinical Goal(s):  Over the next 90 days, patient will verbalize ability to afford treatment regimen achieve control of diabetes as evidenced by A1c less than 8% through collaboration with PharmD and provider.    Interventions: 1:1 collaboration with Birdie Sons, MD regarding development and update of comprehensive plan of care as evidenced by provider attestation and co-signature Inter-disciplinary care team collaboration (see longitudinal plan of care) Comprehensive medication review performed; medication list updated in electronic medical record  Heart Failure (Goal: control symptoms and prevent exacerbations) -Controlled Type: Systolic -NYHA Class: III (marked limitation of activity) -Ejection fraction: 35-40% (Date: Sep 2021) -Current treatment: Carvedilol 6.25 mg daily  Losartan 50 mg daily  Torsemide 20 mg daily  -Medications previously tried: Entresto, Amlodipine, valsartan, spironolactone, Carvedilol -Current home BP/HR readings:   120-130s/70-80s  -weight has been stable, ranging from 187-195  -Educated on Importance of weighing daily; if you gain more than 3 pounds in one day or 5 pounds in one week, contact provider's office -CHANGE Carvedilol to 6.25 mg twice daily  Hyperlipidemia: (LDL goal < 70) -History of CAD, history of CVA  -Controlled -Current treatment: Atorvastatin 40 mg daily  -Medications previously tried: NA  -Educated on Benefits of statin for ASCVD risk reduction; Importance of limiting foods high in cholesterol; -Recommended to continue current medication  Diabetes (A1c goal <8%) -Diagnosed Jan 2021 -Controlled -Current medications: Metformin XR 500 mg 2 tablets daily: Appropriate, Effective, Safe, Accessible  -Medications previously tried: NA  -Current home glucose readings fasting glucose: 120 -Denies hypoglycemic/hyperglycemic symptoms  -Current meal patterns: cut out sodas. Replaced with twist water. 2 sandwiches with lunch down to 1.  -Continue current medications   Atrial Fibrillation (Goal: prevent stroke and major bleeding) -Controlled -CHADSVASC: 8 -Current treatment: Rate control: Carvedilol 6.25 mg twice daily: Appropriate,  Effective, Safe, Accessible Anticoagulation: Eliquis 5 mg daily: Appropriate, Effective, Safe, Accessible   -Medications previously tried: NA -Home BP and HR readings: NA  -Continue current medications  Gout (Goal: prevent gout flares) -Controlled -Last Uric acid elevated above goal, but patient asymptomatic.  -Current treatment  Allopurinol 300 mg 1/2 tablets daily  -Medications previously tried: NA  -Last flare: 2 years  -Counseled on diet and exercise extensively Recommended to continue current medication  GERD (Goal: Minimize symptoms of heartburn or reflux) -History of Barrett's Esophagus  -Controlled -Current treatment  Pantoprazole 40 mg daily  -Medications previously tried: NA  -Recommended to continue current medication  Chronic Kidney Disease Stage 3b  -All medications assessed for renal dosing and appropriateness in chronic kidney disease. -Could consider SGLT2 inhibitor in the future.  -Recommended to continue current medication  Patient Goals/Self-Care Activities Over the next 90 days, patient will:  - check glucose daily before breakfast, document, and provide at future appointments check blood pressure 2-3 times weekly , document, and provide at future appointments weigh daily, and contact provider if weight gain of greater than 3 pounds in one day  Follow Up Plan: Telephone follow up appointment with care management team member scheduled for:  04/30/2022 at 3:45 PM

## 2022-05-07 ENCOUNTER — Ambulatory Visit (INDEPENDENT_AMBULATORY_CARE_PROVIDER_SITE_OTHER): Payer: Medicare Other

## 2022-05-07 VITALS — Wt 196.0 lb

## 2022-05-07 DIAGNOSIS — Z0000 Encounter for general adult medical examination without abnormal findings: Secondary | ICD-10-CM

## 2022-05-07 NOTE — Patient Instructions (Signed)
Taylor [name] , Thank you for taking time to come for your Medicare Wellness Visit. I appreciate your ongoing commitment to your health goals. Please review the following plan we discussed and let me know if I can assist you in the future.   Screening recommendations/referrals: Colonoscopy: aged out Recommended yearly ophthalmology/optometry visit for glaucoma screening and checkup Recommended yearly dental visit for hygiene and checkup  Vaccinations: Influenza vaccine: n/d Pneumococcal vaccine: 05/12/15 Tdap vaccine: n/d Shingles vaccine: n/d   Covid-19: 02/24/20, 03/21/20  Advanced directives: yes, copy needed  Conditions/risks identified: none  Next appointment: Follow up in one year for your annual wellness visit. 05/12/23 @ 9:45 am by phone  Preventive Care 65 Years and Older, Male Preventive care refers to lifestyle choices and visits with your health care provider that can promote health and wellness. What does preventive care include? A yearly physical exam. This is also called an annual well check. Dental exams once or twice a year. Routine eye exams. Ask your health care provider how often you should have your eyes checked. Personal lifestyle choices, including: Daily care of your teeth and gums. Regular physical activity. Eating a healthy diet. Avoiding tobacco and drug use. Limiting alcohol use. Practicing safe sex. Taking low doses of aspirin every day. Taking vitamin and mineral supplements as recommended by your health care provider. What happens during an annual well check? The services and screenings done by your health care provider during your annual well check will depend on your age, overall health, lifestyle risk factors, and family history of disease. Counseling  Your health care provider may ask you questions about your: Alcohol use. Tobacco use. Drug use. Emotional well-being. Home and relationship well-being. Sexual activity. Eating habits. History of  falls. Memory and ability to understand (cognition). Work and work Statistician. Screening  You may have the following tests or measurements: Height, weight, and BMI. Blood pressure. Lipid and cholesterol levels. These may be checked every 5 years, or more frequently if you are over 30 years old. Skin check. Lung cancer screening. You may have this screening every year starting at age 20 if you have a 30-pack-year history of smoking and currently smoke or have quit within the past 15 years. Fecal occult blood test (FOBT) of the stool. You may have this test every year starting at age 76. Flexible sigmoidoscopy or colonoscopy. You may have a sigmoidoscopy every 5 years or a colonoscopy every 10 years starting at age 73. Prostate cancer screening. Recommendations will vary depending on your family history and other risks. Hepatitis C blood test. Hepatitis B blood test. Sexually transmitted disease (STD) testing. Diabetes screening. This is done by checking your blood sugar (glucose) after you have not eaten for a while (fasting). You may have this done every 1-3 years. Abdominal aortic aneurysm (AAA) screening. You may need this if you are a current or former smoker. Osteoporosis. You may be screened starting at age 52 if you are at high risk. Talk with your health care provider about your test results, treatment options, and if necessary, the need for more tests. Vaccines  Your health care provider may recommend certain vaccines, such as: Influenza vaccine. This is recommended every year. Tetanus, diphtheria, and acellular pertussis (Tdap, Td) vaccine. You may need a Td booster every 10 years. Zoster vaccine. You may need this after age 4. Pneumococcal 13-valent conjugate (PCV13) vaccine. One dose is recommended after age 70. Pneumococcal polysaccharide (PPSV23) vaccine. One dose is recommended after age 32. Talk to your  health care provider about which screenings and vaccines you need and  how often you need them. This information is not intended to replace advice given to you by your health care provider. Make sure you discuss any questions you have with your health care provider. Document Released: 12/08/2015 Document Revised: 07/31/2016 Document Reviewed: 09/12/2015 Elsevier Interactive Patient Education  2017 [place] Prevention in the Home Falls can cause injuries. They can happen to people of all ages. There are many things you can do to make your home safe and to help prevent falls. What can I do on the outside of my home? Regularly fix the edges of walkways and driveways and fix any cracks. Remove anything that might make you trip as you walk through a door, such as a raised step or threshold. Trim any bushes or trees on the path to your home. Use bright outdoor lighting. Clear any walking paths of anything that might make someone trip, such as rocks or tools. Regularly check to see if handrails are loose or broken. Make sure that both sides of any steps have handrails. Any raised decks and porches should have guardrails on the edges. Have any leaves, snow, or ice cleared regularly. Use sand or salt on walking paths during winter. Clean up any spills in your garage right away. This includes oil or grease spills. What can I do in the bathroom? Use night lights. Install grab bars by the toilet and in the tub and shower. Do not use towel bars as grab bars. Use non-skid mats or decals in the tub or shower. If you need to sit down in the shower, use a plastic, non-slip stool. Keep the floor dry. Clean up any water that spills on the floor as soon as it happens. Remove soap buildup in the tub or shower regularly. Attach bath mats securely with double-sided non-slip rug tape. Do not have throw rugs and other things on the floor that can make you trip. What can I do in the bedroom? Use night lights. Make sure that you have a light by your bed that is easy to  reach. Do not use any sheets or blankets that are too big for your bed. They should not hang down onto the floor. Have a firm chair that has side arms. You can use this for support while you get dressed. Do not have throw rugs and other things on the floor that can make you trip. What can I do in the kitchen? Clean up any spills right away. Avoid walking on wet floors. Keep items that you use a lot in easy-to-reach places. If you need to reach something above you, use a strong step stool that has a grab bar. Keep electrical cords out of the way. Do not use floor polish or wax that makes floors slippery. If you must use wax, use non-skid floor wax. Do not have throw rugs and other things on the floor that can make you trip. What can I do with my stairs? Do not leave any items on the stairs. Make sure that there are handrails on both sides of the stairs and use them. Fix handrails that are broken or loose. Make sure that handrails are as long as the stairways. Check any carpeting to make sure that it is firmly attached to the stairs. Fix any carpet that is loose or worn. Avoid having throw rugs at the top or bottom of the stairs. If you do have throw rugs, attach them  to the floor with carpet tape. Make sure that you have a light switch at the top of the stairs and the bottom of the stairs. If you do not have them, ask someone to add them for you. What else can I do to help prevent falls? Wear shoes that: Do not have high heels. Have rubber bottoms. Are comfortable and fit you well. Are closed at the toe. Do not wear sandals. If you use a stepladder: Make sure that it is fully opened. Do not climb a closed stepladder. Make sure that both sides of the stepladder are locked into place. Ask someone to hold it for you, if possible. Clearly mark and make sure that you can see: Any grab bars or handrails. First and last steps. Where the edge of each step is. Use tools that help you move  around (mobility aids) if they are needed. These include: Canes. Walkers. Scooters. Crutches. Turn on the lights when you go into a dark area. Replace any light bulbs as soon as they burn out. Set up your furniture so you have a clear path. Avoid moving your furniture around. If any of your floors are uneven, fix them. If there are any pets around you, be aware of where they are. Review your medicines with your doctor. Some medicines can make you feel dizzy. This can increase your chance of falling. Ask your doctor what other things that you can do to help prevent falls. This information is not intended to replace advice given to you by your health care provider. Make sure you discuss any questions you have with your health care provider. Document Released: 09/07/2009 Document Revised: 04/18/2016 Document Reviewed: 12/16/2014 Elsevier Interactive Patient Education  2017 [employer].

## 2022-05-07 NOTE — Progress Notes (Signed)
Virtual Visit via Telephone Note  I connected with  Taylor [name] on 05/07/22 at 11:15 AM EDT by telephone and verified that I am speaking with the correct person using two identifiers.  Location: Patient: home Provider: [facility] Persons participating in the virtual visit: [place]   I discussed the limitations, risks, security and privacy concerns of performing an evaluation and management service by telephone and the availability of in person appointments. The patient expressed understanding and agreed to proceed.  Interactive audio and video telecommunications were attempted between this nurse and patient, however failed, due to patient having technical difficulties OR patient did not have access to video capability.  We continued and completed visit with audio only.  Some vital signs may be absent or patient reported.   Dionisio David, LPN  Subjective:   Taylor [name] is a 81 y.o. male who presents for Medicare Annual/Subsequent preventive examination.  Review of Systems           Objective:    There were no vitals filed for this visit. There is no height or weight on file to calculate BMI.     08/09/2020    5:17 PM 06/29/2020    1:40 PM 11/23/2019    1:16 AM 11/22/2019    4:16 PM 11/14/2019    4:14 PM 11/13/2019   11:00 PM 11/12/2019    7:23 PM  Advanced Directives  Does Patient Have a Medical Advance Directive? No Yes Yes No  Yes No  Type of Corporate treasurer of [place];Living will Living will Living will  Living will   Does patient want to make changes to medical advance directive?   No - Patient declined No - Patient declined No - Patient declined No - Patient declined   Copy of [place] in Chart?  No - copy requested       Would patient like information on creating a medical advance directive? No - Patient declined   No - Patient declined       Current Medications (verified) Outpatient Encounter Medications  as of 05/07/2022  Medication Sig   [START ON 09/25/2022] allopurinol (ZYLOPRIM) 300 MG tablet Take 0.5 tablets (150 mg total) by mouth daily.   aspirin 81 MG EC tablet Take 81 mg by mouth daily.    atorvastatin (LIPITOR) 40 MG tablet Take 1 tablet (40 mg total) by mouth daily.   carvedilol (COREG) 6.25 MG tablet Take 1 tablet (6.25 mg total) by mouth 2 (two) times daily with a meal.   Cyanocobalamin (B-12) 2500 MCG TABS Take 2,500 mcg by mouth daily.   glucose blood (ONETOUCH VERIO) test strip Use as instructed to check sugar daily   losartan (COZAAR) 50 MG tablet TAKE 1 TABLET(50 MG) BY MOUTH DAILY   magnesium oxide (MAG-OX) 400 MG tablet Take 500 mg by mouth 2 (two) times daily.   metFORMIN (GLUCOPHAGE-XR) 500 MG 24 hr tablet TAKE 2 TABLETS(1000 MG) BY MOUTH DAILY   pantoprazole (PROTONIX) 40 MG tablet TAKE 1 TABLET BY MOUTH EVERY DAY   potassium chloride SA (KLOR-CON) 20 MEQ tablet Take 20 mEq by mouth daily.   torsemide (DEMADEX) 20 MG tablet Take 20 mg by mouth daily.   torsemide (DEMADEX) 20 MG tablet Take by mouth.   triamcinolone ointment (KENALOG) 0.1 % Apply 1 application topically 2 (two) times daily. As directed   apixaban (ELIQUIS) 5 MG TABS tablet Take 5 mg by mouth 2 (two) times daily.  Azelastine-Fluticasone 137-50 MCG/ACT SUSP Place 1 spray into the nose every 12 (twelve) hours. (Patient not taking: Reported on 03/29/2022)   Calcium Carbonate Antacid 600 MG chewable tablet Chew 1 tablet by mouth daily. (Patient not taking: Reported on 05/07/2022)   cetirizine (ZYRTEC) 10 MG tablet Take 1 tablet (10 mg total) by mouth daily. (Patient not taking: Reported on 05/07/2022)   GLUCOSAMINE HCL PO Take 1,500 mg by mouth daily.  (Patient not taking: Reported on 05/07/2022)   ipratropium (ATROVENT) 0.06 % nasal spray Place 2 sprays into both nostrils 4 (four) times daily. (Patient not taking: Reported on 03/29/2022)   sodium chloride (OCEAN) 0.65 % SOLN nasal spray Place 1 spray into both  nostrils as needed for congestion. (Patient not taking: Reported on 03/29/2022)   No facility-administered encounter medications on file as of 05/07/2022.    Allergies (verified) Amlodipine besylate, Crestor [rosuvastatin], and Rocephin [ceftriaxone]   History: Past Medical History:  Diagnosis Date   AAA (abdominal aortic aneurysm) ([place]) 06/03/2007   [facility]; Dr. Kellie Simmering   AICD (automatic cardioverter/defibrillator) present    Barrett's esophagus    Bladder cancer [facility])    Bradycardia    CAD (coronary artery disease)    CHF (congestive heart failure) (HCC)    Cluster headache    DDD (degenerative disc disease), lumbar    Dyspnea    WITH EXERTION   Edema    LEFT ANKLE   Fracture of skull base ([place]) 1997   due to fall   GERD (gastroesophageal reflux disease)    Gout    History of bladder cancer 12/1995   Hyperlipidemia    Hypertension    Malignant melanoma ([place]) 12/2012   right dorsal forearm excised   Myocardial infarction [facility])    LAST 2014   Osteoarthritis of knee    Pacemaker 10/10/2006   Pneumonia    2016   Pre-diabetes    Psoriasis    Rib fracture 1997   due to fall   Sleep apnea    CPAP   Stroke [facility])    Venous incompetence    Past Surgical History:  Procedure Laterality Date   ABDOMINAL AORTIC ANEURYSM REPAIR  06/03/2007   [facility]; Dr. Kellie Simmering   ANGIOPLASTY  1994   MI   BLADDER TUMOR EXCISION  12/1995   CATARACT EXTRACTION W/PHACO Left 10/22/2016   Procedure: CATARACT EXTRACTION PHACO AND INTRAOCULAR LENS PLACEMENT ([place]);  Surgeon: Birder Robson, MD;  Location: [facility] ORS;  Service: Ophthalmology;  Laterality: Left;  [place] 47.7 AP% 18.4 CDE 8.78 Fluid pack lot # [id number]   CATARACT EXTRACTION W/PHACO Right 12/10/2016   Procedure: CATARACT EXTRACTION PHACO AND INTRAOCULAR LENS PLACEMENT ([facility]);  Surgeon: Birder Robson, MD;  Location: [facility] ORS;  Service: Ophthalmology;  Laterality: Right;  [place] 00:39 AP% 23.3 CDE  9.13 Fluid pack lot # [id number]   CORONARY ANGIOPLASTY     STENTS X 5   CORONARY ARTERY BYPASS GRAFT  09/22/2006   four   ELBOW BURSA SURGERY     DUE TO GOUT   INSERT / REPLACE / REMOVE PACEMAKER     MELANOMA EXCISION  12/2012   Right forearm   Family History  Problem Relation Age of Onset   Cancer Mother        Melanoma skin cancer   Heart attack Father 67   Cancer Father        throat cancer   Arthritis Brother    Social History   Socioeconomic  History   Marital status: Married    Spouse name: Not on file   Number of children: 3   Years of education: Not on file   Highest education level: 12th grade  Occupational History   Occupation: retired    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  Independent at baseline.   Social Determinants of Health   Financial Resource Strain: Low Risk  (01/29/2022)   Overall Financial Resource Strain (CARDIA)    Difficulty of Paying Living Expenses: Not hard at all  Food Insecurity: No Food Insecurity (04/18/2021)   Hunger Vital Sign    Worried About Running Out of Food in the Last Year: Never true    Ran Out of Food in the Last Year: Never true  Transportation Needs: No Transportation Needs (04/18/2021)   PRAPARE - Hydrologist (Medical): No    Lack of Transportation (Non-Medical): No  Physical Activity: Insufficiently Active (04/18/2021)   Exercise Vital Sign    Days of Exercise per Week: 1 day    Minutes of Exercise per Session: 120 min  Stress: No Stress Concern Present (05/07/2022)   [place]    Feeling of Stress : Not at all  Social Connections: Unknown (04/18/2021)   Social Connection and Isolation Panel [NHANES]    Frequency of Communication with Friends and Family: Twice a week    Frequency of Social Gatherings with Friends and Family: Twice a week    Attends Religious Services: More than 4 times per year    Active Member of [employer] or Organizations: Yes    Attends Music therapist: More than 4 times per year    Marital Status: Not on file    Tobacco Counseling Counseling given: Not Answered   Clinical Intake:  Pre-visit preparation completed: Yes  Pain : No/denies pain     Nutritional Risks: None Diabetes: Yes CBG done?: No Did pt. bring in CBG monitor from home?: No  How often do you need to have someone help you when you read instructions, pamphlets, or other written materials from your doctor or pharmacy?: 1 - Never  Diabetic?yes Nutrition Risk Assessment:  Has the patient had any N/V/D within the last 2 months?  No  Does the patient have any non-healing wounds?  No  Has the patient had any unintentional weight loss or weight gain?  No   Diabetes:  Is the patient diabetic?  Yes  If diabetic, was a CBG obtained today?  No  Did the patient bring in their glucometer from home?  No  How often do you monitor your CBG's? Once per week   Financial Strains and Diabetes Management:  Are you having any financial strains with the device, your supplies or your medication? No .  Does the patient want to be seen by Chronic Care Management for management of their diabetes?  No  Would the patient like to be referred to a  Nutritionist or for Diabetic Management?  No   Diabetic Exams:  Diabetic Eye Exam: Completed 01/07/22.  Pt has been advised about the importance in completing this exam.   Diabetic Foot Exam: Completed no. Pt has been advised about the importance in completing this exam. Had  one from [facility] home visit nurse 2 months ago   Interpreter Needed?: No  Information entered by :: Kirke Shaggy, LPN   Activities of Daily Living    03/29/2022    2:01 PM 12/11/2021    9:32 AM  In your present state of health, do you have any difficulty performing the following activities:  Hearing? 1 1  Vision? 1 1  Difficulty concentrating or making decisions? 0 0  Walking or climbing stairs? 1 0  Dressing or bathing? 0 0  Doing errands, shopping? 0 0    Patient Care Team: Birdie Sons, MD as PCP - General (Family Medicine) Corey Skains, MD as Consulting Physician (Cardiology) Birder Robson, MD as Referring Physician (Ophthalmology) Ottie Glazier, MD as Consulting Physician (Pulmonary Disease) Germaine Pomfret, [facility] (Pharmacist)  Indicate any recent Medical Services you may have received from other than [facility] providers in the past year (date may be approximate).     Assessment:   This is a routine wellness examination for Taylor [name].  Hearing/Vision screen No results found.  Dietary issues and exercise activities discussed:     Goals Addressed             This Visit's Progress    DIET - EAT MORE FRUITS AND VEGETABLES         Depression Screen    05/07/2022   11:20 AM 03/29/2022    2:01 PM 12/11/2021    9:32 AM 06/25/2021    8:24 AM 05/08/2021    9:39 AM 06/29/2020    1:36 PM 05/11/2019    9:00 AM  PHQ 2/9 Scores  PHQ - 2 Score '2 2 4 4 2 '$ 0 0  PHQ- 9 Score '4 6 6 9 3  '$ 0    Fall Risk    03/29/2022    2:01 PM 12/11/2021    9:32 AM 05/08/2021    9:39 AM 06/29/2020    1:40 PM 04/19/2020    9:37 AM  Fall Risk   Falls in the past year? 0 0 0 0 0  Number falls in past yr: 0 0 0 0 0  Injury with Fall? 0 0 0 0 0  Risk for fall due to :  No Fall Risks No Fall Risks    Follow up Falls evaluation completed Falls evaluation completed Falls evaluation completed  Falls evaluation completed    [place]:  Any stairs in or  around the home? No  If so, are there any without handrails? No  Home free of loose throw rugs in walkways, pet beds, electrical cords, etc? Yes  Adequate lighting in your home to reduce risk of falls? Yes   ASSISTIVE DEVICES UTILIZED TO PREVENT FALLS:  Life alert? No  Use of a cane, walker or w/c? No  Grab bars in the bathroom? No  Shower chair or bench in shower? No  Elevated toilet seat or a handicapped toilet? No   Cognitive Function:declined to test        03/31/2018    2:05 PM 02/26/2017    9:19 AM  6CIT Screen  What Year? 0 points 0 points  What month? 0 points 0 points  What  time? 0 points 0 points  Count back from 20 0 points 0 points  Months in reverse 0 points 0 points  Repeat phrase 2 points 4 points  Total Score 2 points 4 points    Immunizations Immunization History  Administered Date(s) Administered   Fluad Quad(high Dose 65+) 10/04/2020   Influenza Split 09/18/2011   Influenza, High Dose Seasonal PF 08/16/2014, 10/09/2016, 10/09/2017, 08/26/2018   Influenza,inj,Quad PF,6+ Mos 12/20/2015   PFIZER(Purple Top)SARS-COV-2 Vaccination 02/24/2020, 03/21/2020   Pneumococcal Conjugate-13 05/09/2014   Pneumococcal Polysaccharide-23 05/12/2015    TDAP status: Due, Education has been provided regarding the importance of this vaccine. Advised may receive this vaccine at local pharmacy or Health Dept. Aware to provide a copy of the vaccination record if obtained from local pharmacy or Health Dept. Verbalized acceptance and understanding.  Flu Vaccine status: Declined, Education has been provided regarding the importance of this vaccine but patient still declined. Advised may receive this vaccine at local pharmacy or Health Dept. Aware to provide a copy of the vaccination record if obtained from local pharmacy or Health Dept. Verbalized acceptance and understanding.  Pneumococcal vaccine status: Up to date  Covid-19 vaccine status: Completed vaccines  Qualifies for  Shingles Vaccine? Yes   Zostavax completed No   Shingrix Completed?: No.    Education has been provided regarding the importance of this vaccine. Patient has been advised to call insurance company to determine out of pocket expense if they have not yet received this vaccine. Advised may also receive vaccine at local pharmacy or Health Dept. Verbalized acceptance and understanding.  Screening Tests Health Maintenance  Topic Date Due   FOOT EXAM  Never done   Zoster Vaccines- Shingrix (1 of 2) Never done   COVID-19 Vaccine (3 - Pfizer series) 05/16/2020   TETANUS/TDAP  12/06/2023 (Originally 10/06/1960)   INFLUENZA VACCINE  06/25/2022   HEMOGLOBIN A1C  09/26/2022   OPHTHALMOLOGY EXAM  01/07/2023   Pneumonia Vaccine 44+ Years old  Completed   HPV VACCINES  Aged Out    Health Maintenance  Health Maintenance Due  Topic Date Due   FOOT EXAM  Never done   Zoster Vaccines- Shingrix (1 of 2) Never done   COVID-19 Vaccine (3 - Pfizer series) 05/16/2020    Colorectal cancer screening: No longer required.   Lung Cancer Screening: (Low Dose CT Chest recommended if Age 87-80 years, 30 pack-year currently smoking OR have quit w/in 15years.) does not qualify.    Additional Screening:  Hepatitis C Screening: does qualify; Completed no  Vision Screening: Recommended annual ophthalmology exams for early detection of glaucoma and other disorders of the eye. Is the patient up to date with their annual eye exam?  Yes  Who is the provider or what is the name of the office in which the patient attends annual eye exams? [facility] If pt is not established with a provider, would they like to be referred to a provider to establish care? No .   Dental Screening: Recommended annual dental exams for proper oral hygiene  Community Resource Referral / Chronic Care Management: CRR required this visit?  No   CCM required this visit?  No      Plan:     I have personally reviewed and noted  the following in the patient's chart:   Medical and social history Use of alcohol, tobacco or illicit drugs  Current medications and supplements including opioid prescriptions. Patient is not currently taking opioid prescriptions. Functional ability and status  Nutritional status Physical activity Advanced directives List of other physicians Hospitalizations, surgeries, and ER visits in previous 12 months Vitals Screenings to include cognitive, depression, and falls Referrals and appointments  In addition, I have reviewed and discussed with patient certain preventive protocols, quality metrics, and best practice recommendations. A written personalized care plan for preventive services as well as general preventive health recommendations were provided to patient.     DETRAVION TESTER, LPN   [id number]   Nurse Notes: none

## 2022-05-20 ENCOUNTER — Ambulatory Visit (INDEPENDENT_AMBULATORY_CARE_PROVIDER_SITE_OTHER): Payer: Medicare Other | Admitting: Nurse Practitioner

## 2022-05-20 ENCOUNTER — Encounter (INDEPENDENT_AMBULATORY_CARE_PROVIDER_SITE_OTHER): Payer: Self-pay | Admitting: Nurse Practitioner

## 2022-05-20 VITALS — BP 144/79 | HR 76 | Resp 16 | Wt 189.4 lb

## 2022-05-20 DIAGNOSIS — R42 Dizziness and giddiness: Secondary | ICD-10-CM

## 2022-05-20 DIAGNOSIS — I6523 Occlusion and stenosis of bilateral carotid arteries: Secondary | ICD-10-CM

## 2022-05-20 DIAGNOSIS — I10 Essential (primary) hypertension: Secondary | ICD-10-CM | POA: Diagnosis not present

## 2022-05-20 NOTE — Progress Notes (Signed)
Subjective:    Patient ID: KIEL COCKERELL, male    DOB: Oct 19, 1941, 81 y.o.   MRN: [id number] Chief Complaint  Patient presents with   New Patient (Initial Visit)    Ref Ostwalt consult atherosclerosis bilateral carotid arteries    Denton Derks is a 81 year old male who presents today as a referral from Ms. Ostwalt, PA-C in regards to dizziness.  The patient notes that he has dizziness that is most significant when he moves from sitting to standing or primarily with positional changes.  The patient also anticipates getting regular therapy and notes significant dizziness with skating recently.  It has not affected his ability to participate in these activities.  He denies any CVA-like symptoms.  He denies any TIA-like symptoms.  He denies any amaurosis fugax.  Noninvasive studies showed a 70% stenosis of the left ICA with just under 50% of the right ICA.  The patient had noted antegrade flow in bilateral vertebral arteries.    Review of Systems  Neurological:  Positive for dizziness.  All other systems reviewed and are negative.      Objective:   Physical Exam Vitals reviewed.  HENT:     Head: Normocephalic.  Neck:     Vascular: Carotid bruit present.  Cardiovascular:     Rate and Rhythm: Normal rate and regular rhythm.     Pulses: Normal pulses.  Pulmonary:     Effort: Pulmonary effort is normal.  Neurological:     Mental Status: He is alert and oriented to person, place, and time.  Psychiatric:        Mood and Affect: Mood normal.        Behavior: Behavior normal.        Thought Content: Thought content normal.        Judgment: Judgment normal.     BP (!) 144/79 (BP Location: Right Arm)   Pulse 76   Resp 16   Wt 189 lb 6.4 oz (85.9 kg)   BMI 27.18 kg/m   Past Medical History:  Diagnosis Date   AAA (abdominal aortic aneurysm) ([place]) 06/03/2007   [facility]; Dr. Kellie Simmering   AICD (automatic cardioverter/defibrillator) present    Barrett's esophagus     Bladder cancer [facility])    Bradycardia    CAD (coronary artery disease)    CHF (congestive heart failure) (HCC)    Cluster headache    DDD (degenerative disc disease), lumbar    Dyspnea    WITH EXERTION   Edema    LEFT ANKLE   Fracture of skull base ([place]) 1997   due to fall   GERD (gastroesophageal reflux disease)    Gout    History of bladder cancer 12/1995   Hyperlipidemia    Hypertension    Malignant melanoma ([place]) 12/2012   right dorsal forearm excised   Myocardial infarction [facility])    LAST 2014   Osteoarthritis of knee    Pacemaker 10/10/2006   Pneumonia    2016   Pre-diabetes    Psoriasis    Rib fracture 1997   due to fall   Sleep apnea    CPAP   Stroke ([place])    Venous incompetence     Social History   Socioeconomic History   Marital status: Married    Spouse name: Not on file   Number of children: 3   Years of education: Not on file   Highest education level: 12th grade  Occupational History   Occupation: retired  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  Independent at baseline.   Social Determinants of Health   Financial Resource Strain: Low Risk  (05/07/2022)   Overall Financial Resource Strain (CARDIA)    Difficulty of Paying Living Expenses: Not hard at all  Food Insecurity: No Food Insecurity (05/07/2022)   Hunger Vital Sign    Worried About Running Out of Food in the Last Year: Never true    Ran Out of Food in the Last Year: Never true  Transportation Needs: No Transportation Needs (05/07/2022)   PRAPARE - Hydrologist  (Medical): No    Lack of Transportation (Non-Medical): No  Physical Activity: Insufficiently Active (04/18/2021)   Exercise Vital Sign    Days of Exercise per Week: 1 day    Minutes of Exercise per Session: 120 min  Stress: No Stress Concern Present (05/07/2022)   [place]    Feeling of Stress : Not at all  Social Connections: Moderately Isolated (05/07/2022)   Social Connection and Isolation Panel [NHANES]    Frequency of Communication with Friends and Family: Never    Frequency of Social Gatherings with Friends and Family: Once a week    Attends Religious Services: More than 4 times per year    Active Member of [employer] or Organizations: No    Attends Archivist Meetings: Never    Marital Status: Married  Human resources officer Violence: Not At Risk (05/07/2022)   Humiliation, Afraid, Rape, and Kick questionnaire    Fear of Current or Ex-Partner: No    Emotionally Abused: No    Physically Abused: No    Sexually Abused: No    Past Surgical History:  Procedure Laterality Date   ABDOMINAL AORTIC ANEURYSM REPAIR  06/03/2007   [facility]; Dr. Kellie Simmering   ANGIOPLASTY  1994   MI   BLADDER TUMOR EXCISION  12/1995   CATARACT EXTRACTION W/PHACO Left 10/22/2016   Procedure: CATARACT EXTRACTION PHACO AND INTRAOCULAR LENS PLACEMENT ([place]);  Surgeon: Birder Robson, MD;  Location: [facility] ORS;  Service: Ophthalmology;  Laterality: Left;  [place] 47.7 AP% 18.4 CDE 8.78 Fluid pack lot # [id number]   CATARACT EXTRACTION W/PHACO Right 12/10/2016   Procedure: CATARACT EXTRACTION PHACO AND INTRAOCULAR LENS PLACEMENT ([facility]);  Surgeon: Birder Robson, MD;  Location: [facility] ORS;  Service: Ophthalmology;  Laterality: Right;  [place] 00:39 AP% 23.3 CDE 9.13 Fluid pack lot # [id number]   CORONARY ANGIOPLASTY     STENTS X 5   CORONARY ARTERY BYPASS GRAFT  09/22/2006   four   ELBOW BURSA SURGERY     DUE TO GOUT   INSERT / REPLACE /  REMOVE PACEMAKER     MELANOMA EXCISION  12/2012   Right forearm    Family History  Problem Relation Age of Onset   Cancer Mother        Melanoma skin cancer   Heart attack Father 78   Cancer Father  throat cancer   Arthritis Brother     Allergies  Allergen Reactions   Amlodipine Besylate Swelling    Had a reaction when taking with colcrys    Crestor [Rosuvastatin]     Muscle cramps and pain   Rocephin [Ceftriaxone]     unknown       Latest Ref Rng & Units 03/26/2022    2:03 PM 12/11/2021   10:51 AM 06/25/2021    8:41 AM  CBC  WBC 3.4 - 10.8 x10E3/uL 7.6  7.7  7.2   Hemoglobin 13.0 - 17.7 g/dL 15.4  16.3  14.9   Hematocrit 37.5 - 51.0 % 45.3  47.5  45.1   Platelets 150 - 450 x10E3/uL 187  160  124       CMP     Component Value Date/Time   NA 142 03/26/2022 1403   K 3.9 03/26/2022 1403   CL 100 03/26/2022 1403   CO2 27 03/26/2022 1403   GLUCOSE 143 (H) 03/26/2022 1403   GLUCOSE 159 (H) 08/31/2020 1421   BUN 31 (H) 03/26/2022 1403   CREATININE 1.77 (H) 03/26/2022 1403   CALCIUM 9.6 03/26/2022 1403   PROT 7.2 03/26/2022 1403   ALBUMIN 4.3 03/26/2022 1403   AST 22 03/26/2022 1403   ALT 13 03/26/2022 1403   ALKPHOS 149 (H) 03/26/2022 1403   BILITOT 0.7 03/26/2022 1403   GFRNONAA 52 (L) 11/21/2020 1505   GFRAA 60 11/21/2020 1505     No results found.     Assessment & Plan:   1. Bilateral carotid artery stenosis Noninvasive studies show that the patient had just under 70% stenosis of the left ICA with under 50% of the right.  The bilateral vertebral arteries have antegrade flow however there is no evaluation of the subclavian arteries.  Given the patient's symptoms and the possibly high-grade stenosis of the left ICA we will have the patient undergo the CT angiogram for further evaluation. - CT ANGIO NECK W OR WO CONTRAST; Future  2. Dizziness Typically dizziness as relates to extracranial circulation visually consistent with subclavian steal.  In  discussing the patient's symptoms are not quite consistent with still like symptoms.  However CT angiogram as noted above will help [place] to better evaluate this is the possible cause of his dizziness.  3. Hypertension, unspecified type Continue antihypertensive medications as already ordered, these medications have been reviewed and there are no changes at this time.    Current Outpatient Medications on File Prior to Visit  Medication Sig Dispense Refill   [START ON 09/25/2022] allopurinol (ZYLOPRIM) 300 MG tablet Take 0.5 tablets (150 mg total) by mouth daily.     aspirin 81 MG EC tablet Take 81 mg by mouth daily.      atorvastatin (LIPITOR) 40 MG tablet Take 1 tablet (40 mg total) by mouth daily. 90 tablet 1   carvedilol (COREG) 6.25 MG tablet Take 1 tablet (6.25 mg total) by mouth 2 (two) times daily with a meal. 180 tablet 1   Cyanocobalamin (B-12) 2500 MCG TABS Take 2,500 mcg by mouth daily.     GLUCOSAMINE HCL PO Take 1,500 mg by mouth daily.     glucose blood (ONETOUCH VERIO) test strip Use as instructed to check sugar daily 100 each 12   losartan (COZAAR) 50 MG tablet TAKE 1 TABLET(50 MG) BY MOUTH DAILY 90 tablet 4   magnesium oxide (MAG-OX) 400 MG tablet Take 500 mg by mouth 2 (two) times daily.  metFORMIN (GLUCOPHAGE-XR) 500 MG 24 hr tablet TAKE 2 TABLETS(1000 MG) BY MOUTH DAILY 180 tablet 4   pantoprazole (PROTONIX) 40 MG tablet TAKE 1 TABLET BY MOUTH EVERY DAY 90 tablet 4   potassium chloride SA (KLOR-CON) 20 MEQ tablet Take 20 mEq by mouth daily.     torsemide (DEMADEX) 20 MG tablet Take 20 mg by mouth daily.     torsemide (DEMADEX) 20 MG tablet Take by mouth.     triamcinolone ointment (KENALOG) 0.1 % Apply 1 application topically 2 (two) times daily. As directed 453 g 1   apixaban (ELIQUIS) 5 MG TABS tablet Take 5 mg by mouth 2 (two) times daily.     Azelastine-Fluticasone 137-50 MCG/ACT SUSP Place 1 spray into the nose every 12 (twelve) hours. (Patient not taking: Reported on  03/29/2022) 23 g 1   Calcium Carbonate Antacid 600 MG chewable tablet Chew 1 tablet by mouth daily. (Patient not taking: Reported on 05/07/2022)     cetirizine (ZYRTEC) 10 MG tablet Take 1 tablet (10 mg total) by mouth daily. (Patient not taking: Reported on 05/07/2022) 30 tablet 11   ipratropium (ATROVENT) 0.06 % nasal spray Place 2 sprays into both nostrils 4 (four) times daily. (Patient not taking: Reported on 03/29/2022) 15 mL 12   sodium chloride (OCEAN) 0.65 % SOLN nasal spray Place 1 spray into both nostrils as needed for congestion. (Patient not taking: Reported on 03/29/2022) 30 mL 0   No current facility-administered medications on file prior to visit.    There are no Patient Instructions on file for this visit. No follow-ups on file.   Kris Hartmann, NP

## 2022-05-21 ENCOUNTER — Telehealth (INDEPENDENT_AMBULATORY_CARE_PROVIDER_SITE_OTHER): Payer: Self-pay | Admitting: Nurse Practitioner

## 2022-05-21 DIAGNOSIS — H04223 Epiphora due to insufficient drainage, bilateral lacrimal glands: Secondary | ICD-10-CM | POA: Diagnosis not present

## 2022-05-21 DIAGNOSIS — H02132 Senile ectropion of right lower eyelid: Secondary | ICD-10-CM | POA: Diagnosis not present

## 2022-05-22 ENCOUNTER — Other Ambulatory Visit: Payer: Self-pay | Admitting: Family Medicine

## 2022-05-22 NOTE — Telephone Encounter (Signed)
Requested Prescriptions  Pending Prescriptions Disp Refills  . atorvastatin (LIPITOR) 40 MG tablet [Pharmacy Med Name: ATORVASTATIN '40MG'$  TABLETS] 90 tablet 1    Sig: TAKE 1 TABLET(40 MG) BY MOUTH DAILY     Cardiovascular:  Antilipid - Statins Failed - 05/22/2022  7:18 AM      Failed - Lipid Panel in normal range within the last 12 months    Cholesterol, Total  Date Value Ref Range Status  12/11/2021 112 100 - 199 mg/dL Final   LDL Chol Calc (NIH)  Date Value Ref Range Status  12/11/2021 54 0 - 99 mg/dL Final   HDL  Date Value Ref Range Status  12/11/2021 38 (L) >39 mg/dL Final   Triglycerides  Date Value Ref Range Status  12/11/2021 107 0 - 149 mg/dL Final         Passed - Patient is not pregnant      Passed - Valid encounter within last 12 months    Recent Outpatient Visits          1 month ago [place] Ostwalt, [place], PA-C   1 month ago [place] Ostwalt, [place], PA-C   5 months ago Annual physical exam   [facility] Myles Gip, DO   11 months ago Essential (primary) hypertension   [facility], [name], MD   1 year ago Aortic atherosclerosis [facility])   [facility] Birdie Sons, MD      Future Appointments            In 2 weeks [name], [name], MD [facility], [place]

## 2022-05-30 ENCOUNTER — Ambulatory Visit
Admission: RE | Admit: 2022-05-30 | Discharge: 2022-05-30 | Disposition: A | Discharge status: Home / Self Care | Payer: Medicare Other | Source: Ambulatory Visit | Attending: Nurse Practitioner | Admitting: Nurse Practitioner

## 2022-05-30 DIAGNOSIS — I6523 Occlusion and stenosis of bilateral carotid arteries: Secondary | ICD-10-CM | POA: Diagnosis not present

## 2022-05-30 LAB — POCT I-STAT CREATININE: Creatinine, Ser: 2.5 mg/dL — ABNORMAL HIGH (ref 0.61–1.24)

## 2022-05-30 MED ORDER — IOHEXOL 350 MG/ML SOLN: 75.0000 mL | Freq: Once | INTRAVENOUS | Status: DC | PRN

## 2022-06-06 ENCOUNTER — Ambulatory Visit (INDEPENDENT_AMBULATORY_CARE_PROVIDER_SITE_OTHER): Payer: Medicare Other | Admitting: Nurse Practitioner

## 2022-06-10 ENCOUNTER — Ambulatory Visit: Payer: Medicare Other | Admitting: Family Medicine

## 2022-06-10 ENCOUNTER — Ambulatory Visit (INDEPENDENT_AMBULATORY_CARE_PROVIDER_SITE_OTHER): Payer: Medicare Other | Admitting: Nurse Practitioner

## 2022-06-25 ENCOUNTER — Encounter: Payer: Self-pay | Admitting: Physician Assistant

## 2022-06-25 ENCOUNTER — Ambulatory Visit (INDEPENDENT_AMBULATORY_CARE_PROVIDER_SITE_OTHER): Payer: Medicare Other | Admitting: Physician Assistant

## 2022-06-25 ENCOUNTER — Ambulatory Visit: Payer: Medicare Other | Admitting: Family Medicine

## 2022-06-25 VITALS — BP 124/78 | HR 61 | Ht 70.0 in | Wt 192.5 lb

## 2022-06-25 DIAGNOSIS — R42 Dizziness and giddiness: Secondary | ICD-10-CM

## 2022-06-25 DIAGNOSIS — N1832 Chronic kidney disease, stage 3b: Secondary | ICD-10-CM

## 2022-06-25 DIAGNOSIS — E1122 Type 2 diabetes mellitus with diabetic chronic kidney disease: Secondary | ICD-10-CM | POA: Diagnosis not present

## 2022-06-25 DIAGNOSIS — R7989 Other specified abnormal findings of blood chemistry: Secondary | ICD-10-CM | POA: Diagnosis not present

## 2022-06-25 NOTE — Assessment & Plan Note (Signed)
>>  ASSESSMENT AND PLAN FOR STAGE 3B CHRONIC KIDNEY DISEASE ([place]) WRITTEN ON 06/25/2022  3:33 PM BY DRUBEL, LINDSAY, PA-C  Creat elevated 7/6 considerably Will recheck today and advise Consider d/c allopurinol, metformin

## 2022-06-25 NOTE — Progress Notes (Signed)
I,[name],acting as a Education administrator for [employer], PA-C.,have documented all relevant documentation on the behalf of [name], PA-C,as directed by  [name], PA-C while in the presence of [name], PA-C.   Established patient visit   Patient: Taylor [name]   DOB: [birth year]   81 y.o. Male  MRN: [id number] Visit Date: 06/25/2022  Today's healthcare provider: [name], PA-C   Cc. Dizziness, dm w/u  Subjective    HPI  Dizziness -Pt states he was going to have a CT done, ordered by vascular, but on creat check it was high and he was sent home. This was 7/6 and he has not followed up with vascular.  -Reports dizziness is the same-- worse from sitting to standing, when walking ,stairs. Feels he is falling backwards, needs to catch himself. Denies room spinning, vision changes, syncope, nausea. -Dizziness now greatly limiting his QOL.  Diabetes Mellitus Type II, follow-up  Lab Results  Component Value Date   HGBA1C 6.5 (H) 03/26/2022   HGBA1C 6.5 (H) 12/11/2021   HGBA1C 6.3 (A) 06/25/2021   Last seen for diabetes 6 months ago.  Management since then includes continuing the same treatment. He reports excellent compliance with treatment. He is not having side effects.   Home blood sugar records: fasting range: 135  Episodes of hypoglycemia? No    Current insulin regiment:  Most Recent Eye Exam: Eye operation 2 months ago  ---------------------------------------------------------------------------------------------------  ---------------------------------------------------------------------------------------------------    ---------------------------------------------------------------------------------------------------   Medications: Outpatient Medications Prior to Visit  Medication Sig   [START ON 09/25/2022] allopurinol (ZYLOPRIM) 300 MG tablet Take 0.5 tablets (150 mg total) by mouth daily.   aspirin 81 MG EC tablet Take 81 mg by mouth  daily.    atorvastatin (LIPITOR) 40 MG tablet TAKE 1 TABLET(40 MG) BY MOUTH DAILY   Azelastine-Fluticasone 137-50 MCG/ACT SUSP Place 1 spray into the nose every 12 (twelve) hours.   Calcium Carbonate Antacid 600 MG chewable tablet Chew 1 tablet by mouth daily.   carvedilol (COREG) 6.25 MG tablet Take 1 tablet (6.25 mg total) by mouth 2 (two) times daily with a meal.   cetirizine (ZYRTEC) 10 MG tablet Take 1 tablet (10 mg total) by mouth daily.   Cyanocobalamin (B-12) 2500 MCG TABS Take 2,500 mcg by mouth daily.   GLUCOSAMINE HCL PO Take 1,500 mg by mouth daily.   glucose blood (ONETOUCH VERIO) test strip Use as instructed to check sugar daily   ipratropium (ATROVENT) 0.06 % nasal spray Place 2 sprays into both nostrils 4 (four) times daily.   losartan (COZAAR) 50 MG tablet TAKE 1 TABLET(50 MG) BY MOUTH DAILY   magnesium oxide (MAG-OX) 400 MG tablet Take 500 mg by mouth 2 (two) times daily.   metFORMIN (GLUCOPHAGE-XR) 500 MG 24 hr tablet TAKE 2 TABLETS(1000 MG) BY MOUTH DAILY   pantoprazole (PROTONIX) 40 MG tablet TAKE 1 TABLET BY MOUTH EVERY DAY   potassium chloride SA (KLOR-CON) 20 MEQ tablet Take 20 mEq by mouth daily.   sodium chloride (OCEAN) 0.65 % SOLN nasal spray Place 1 spray into both nostrils as needed for congestion.   torsemide (DEMADEX) 20 MG tablet Take 20 mg by mouth daily.   torsemide (DEMADEX) 20 MG tablet Take by mouth.   triamcinolone ointment (KENALOG) 0.1 % Apply 1 application topically 2 (two) times daily. As directed   apixaban (ELIQUIS) 5 MG TABS tablet Take 5 mg by mouth 2 (two) times daily.   No facility-administered medications prior to  visit.    Review of Systems  Constitutional:  Negative for fatigue and fever.  Respiratory:  Negative for cough and shortness of breath.   Cardiovascular:  Negative for chest pain, palpitations and leg swelling.  Neurological:  Positive for dizziness and light-headedness. Negative for headaches.     Objective    Blood  pressure 124/78, pulse 61, height '5\' 10"'$  (1.778 m), weight 192 lb 8 oz (87.3 kg), SpO2 97 %.   Physical Exam Constitutional:      General: He is awake.     Appearance: He is well-developed.  HENT:     Head: Normocephalic.  Eyes:     Conjunctiva/sclera: Conjunctivae normal.  Cardiovascular:     Rate and Rhythm: Normal rate and regular rhythm.     Heart sounds: Normal heart sounds.  Pulmonary:     Effort: Pulmonary effort is normal.     Breath sounds: Normal breath sounds.  Musculoskeletal:     Comments: Observed gait. Sit to stand normal. Steps are short but not shuffling. Occasionally needs to stop short to catch balance. Leaning forward.  Skin:    General: Skin is warm.  Neurological:     Mental Status: He is alert and oriented to person, place, and time.  Psychiatric:        Attention and Perception: Attention normal.        Mood and Affect: Mood normal.        Speech: Speech normal.        Behavior: Behavior is cooperative.      No results found for any visits on 06/25/22.  Assessment & Plan     Problem List Items Addressed This Visit       Endocrine   Type 2 diabetes mellitus with diabetic chronic kidney disease ([place])    Last A1c 5/23 6.5% and stable. No changes to medications Next visit needs foot exam, uacr.  On statin  F/u 4-6 weeks w/ pcp        Genitourinary   Stage 3b chronic kidney disease ([place])    Creat elevated 7/6 considerably Will recheck today and advise Consider d/c allopurinol, metformin      Relevant Orders   Comprehensive Metabolic Panel (CMET)     Other   Dizziness - Primary    Focused more on dizziness this appt. Pt never followed up regarding cancelled CT scan. After checking gfr/creat today and clearing, will talk with vascular to reschedule. I do think this is an important part of w/u for his dizziness. At this time also referring to neurology. Still may be inner ear in etiology but nature of dizziness makes it less likely.        Relevant Orders   Ambulatory referral to Neurology     Return in about 4 weeks (around 07/23/2022) for chronic conditions.      I, [name], PA-C have reviewed all documentation for this visit. The documentation on  06/25/2022 for the exam, diagnosis, procedures, and orders are all accurate and complete.  [name], PA-C [facility] [address] [place], [place], [address] Office: [phone] Fax: [place]

## 2022-06-25 NOTE — Assessment & Plan Note (Addendum)
Focused more on dizziness this appt. Pt never followed up regarding cancelled CT scan. After checking gfr/creat today and clearing, will talk with vascular to reschedule. I do think this is an important part of w/u for his dizziness. At this time also referring to neurology. Still may be inner ear in etiology but nature of dizziness makes it less likely.

## 2022-06-25 NOTE — Assessment & Plan Note (Signed)
Last A1c 5/23 6.5% and stable. No changes to medications Next visit needs foot exam, uacr.  On statin  F/u 4-6 weeks w/ pcp

## 2022-06-25 NOTE — Assessment & Plan Note (Addendum)
Creat elevated 7/6 considerably Will recheck today and advise Consider d/c allopurinol, metformin

## 2022-06-26 ENCOUNTER — Other Ambulatory Visit (INDEPENDENT_AMBULATORY_CARE_PROVIDER_SITE_OTHER): Payer: Self-pay | Admitting: Nurse Practitioner

## 2022-06-26 ENCOUNTER — Telehealth (INDEPENDENT_AMBULATORY_CARE_PROVIDER_SITE_OTHER): Payer: Self-pay | Admitting: Nurse Practitioner

## 2022-06-26 DIAGNOSIS — I6523 Occlusion and stenosis of bilateral carotid arteries: Secondary | ICD-10-CM

## 2022-06-26 LAB — COMPREHENSIVE METABOLIC PANEL
ALT: 10 IU/L (ref 0–44)
AST: 18 IU/L (ref 0–40)
Albumin/Globulin Ratio: 1.5 (ref 1.2–2.2)
Albumin: 4.2 g/dL (ref 3.8–4.8)
Alkaline Phosphatase: 139 IU/L — ABNORMAL HIGH (ref 44–121)
BUN/Creatinine Ratio: 17 (ref 10–24)
BUN: 34 mg/dL — ABNORMAL HIGH (ref 8–27)
Bilirubin Total: 0.6 mg/dL (ref 0.0–1.2)
CO2: 24 mmol/L (ref 20–29)
Calcium: 9.3 mg/dL (ref 8.6–10.2)
Chloride: 102 mmol/L (ref 96–106)
Creatinine, Ser: 1.99 mg/dL — ABNORMAL HIGH (ref 0.76–1.27)
Globulin, Total: 2.8 g/dL (ref 1.5–4.5)
Glucose: 73 mg/dL (ref 70–99)
Potassium: 4.3 mmol/L (ref 3.5–5.2)
Sodium: 142 mmol/L (ref 134–144)
Total Protein: 7 g/dL (ref 6.0–8.5)
eGFR: 34 mL/min/{1.73_m2} — ABNORMAL LOW (ref >59)

## 2022-06-26 NOTE — Telephone Encounter (Signed)
LVM on pt's home phone regarding scheduling of CT ordered by Eulogio Ditch, NP. No prior authorization is needed. I left radiology scheduling number [phone] for pt to call and schedule CT appt and then to call us back at the office to make CT results appt with Eulogio Ditch. I will try patient back later this afternoon if I have not heard from him.

## 2022-06-27 ENCOUNTER — Telehealth: Payer: Self-pay

## 2022-06-27 ENCOUNTER — Ambulatory Visit (INDEPENDENT_AMBULATORY_CARE_PROVIDER_SITE_OTHER): Payer: Medicare Other | Admitting: Nurse Practitioner

## 2022-06-27 ENCOUNTER — Telehealth (INDEPENDENT_AMBULATORY_CARE_PROVIDER_SITE_OTHER): Payer: Self-pay | Admitting: Nurse Practitioner

## 2022-06-27 NOTE — Telephone Encounter (Signed)
LVM for pt TCB and schedule CT results appt

## 2022-06-27 NOTE — Telephone Encounter (Signed)
PLEASE have patient SEE DR. SCHNIER per Eulogio Ditch, NP. For CT results.

## 2022-06-27 NOTE — Telephone Encounter (Signed)
Pt. Given lab results and instructions, verbalizes understanding. 

## 2022-07-03 ENCOUNTER — Ambulatory Visit
Admission: RE | Admit: 2022-07-03 | Discharge: 2022-07-03 | Disposition: A | Discharge status: Home / Self Care | Payer: Medicare Other | Source: Ambulatory Visit | Attending: Nurse Practitioner | Admitting: Nurse Practitioner

## 2022-07-03 DIAGNOSIS — I6523 Occlusion and stenosis of bilateral carotid arteries: Secondary | ICD-10-CM | POA: Insufficient documentation

## 2022-07-03 DIAGNOSIS — Z981 Arthrodesis status: Secondary | ICD-10-CM | POA: Diagnosis not present

## 2022-07-03 LAB — POCT I-STAT CREATININE: Creatinine, Ser: 2.1 mg/dL — ABNORMAL HIGH (ref 0.61–1.24)

## 2022-07-03 MED ORDER — IOHEXOL 350 MG/ML SOLN
75.0000 mL | Freq: Once | INTRAVENOUS | Status: AC | PRN
Start: 2022-07-03 — End: 2022-07-03
  Administered 2022-07-03: 75 mL via INTRAVENOUS

## 2022-07-05 DIAGNOSIS — I6522 Occlusion and stenosis of left carotid artery: Secondary | ICD-10-CM | POA: Insufficient documentation

## 2022-07-05 NOTE — Progress Notes (Unsigned)
MRN : [id number]  Taylor [name] is a 81 y.o. (01-03-1941) male who presents with chief complaint of check carotid arteries.  History of Present Illness:   The patient is seen for follow up evaluation of carotid stenosis status post CT angiogram. CT scan was done 07/03/2022. Patient reports that the test went well with no problems or complications.   CT results:  CT angiography correlates well with the recent ultrasound exam. 30% stenosis of the distal ICA bulb on the right. No flow limiting stenosis. 70% stenosis of the ICA bulb on the left, primarily due to soft plaque, which places the patient at higher risk.  The patient denies interval amaurosis fugax. There is no recent or interval TIA symptoms or focal motor deficits. There is no prior documented CVA.  The patient is taking enteric-coated aspirin 81 mg daily.  There is no history of migraine headaches. There is no history of seizures.  No recent shortening of the patient's walking distance or new symptoms consistent with claudication.  No history of rest pain symptoms. No new ulcers or wounds of the lower extremities have occurred.  There is no history of DVT, PE or superficial thrombophlebitis. No recent episodes of angina or shortness of breath documented.   CT angiogram is reviewed by me personally and shows *** stenosis consistent with calcified plaque at the origin of the *** internal carotid artery.    No outpatient medications have been marked as taking for the 07/08/22 encounter (Appointment) with Delana Meyer, Dolores Lory, MD.    Past Medical History:  Diagnosis Date   AAA (abdominal aortic aneurysm) ([place]) 06/03/2007   [facility]; Dr. Kellie Simmering   AICD (automatic cardioverter/defibrillator) present    Barrett's esophagus    Bladder cancer [facility])    Bradycardia    CAD (coronary artery disease)    CHF (congestive heart failure) (HCC)    Cluster headache    DDD (degenerative disc disease), lumbar     Dyspnea    WITH EXERTION   Edema    LEFT ANKLE   Fracture of skull base ([place]) 1997   due to fall   GERD (gastroesophageal reflux disease)    Gout    History of bladder cancer 12/1995   Hyperlipidemia    Hypertension    Malignant melanoma ([place]) 12/2012   right dorsal forearm excised   Myocardial infarction [facility])    LAST 2014   Osteoarthritis of knee    Pacemaker 10/10/2006   Pneumonia    2016   Pre-diabetes    Psoriasis    Rib fracture 1997   due to fall   Sleep apnea    CPAP   Stroke [facility])    Venous incompetence     Past Surgical History:  Procedure Laterality Date   ABDOMINAL AORTIC ANEURYSM REPAIR  06/03/2007   [facility]; Dr. Kellie Simmering   ANGIOPLASTY  1994   [place]   BLADDER TUMOR EXCISION  12/1995   CATARACT EXTRACTION W/PHACO Left 10/22/2016   Procedure: CATARACT EXTRACTION PHACO AND INTRAOCULAR LENS PLACEMENT ([place]);  Surgeon: Birder Robson, MD;  Location: [facility] ORS;  Service: Ophthalmology;  Laterality: Left;  [place] 47.7 AP% 18.4 CDE 8.78 Fluid pack lot # [id number]   CATARACT EXTRACTION W/PHACO Right 12/10/2016   Procedure: CATARACT EXTRACTION PHACO AND INTRAOCULAR LENS PLACEMENT (I[facility]);  Surgeon: Birder Robson, MD;  Location: [facility] ORS;  Service: Ophthalmology;  Laterality: Right;  [place] 00:39 AP% 23.3 CDE 9.13 Fluid pack lot #  [id number]   CORONARY ANGIOPLASTY     STENTS X 5   CORONARY ARTERY BYPASS GRAFT  09/22/2006   four   ELBOW BURSA SURGERY     DUE TO GOUT   INSERT / REPLACE / REMOVE PACEMAKER     MELANOMA EXCISION  12/2012   Right forearm    Social History Social History   Tobacco Use   Smoking status: Former    Packs/day: 1.00    Types: Cigarettes    Quit date: 11/26/1999    Years since quitting: 22.6   Smokeless tobacco: Never  Vaping Use   Vaping Use: Never used  Substance Use Topics   Alcohol use: No   Drug use: No    Family History Family History  Problem Relation Age of Onset   Cancer Mother        Melanoma skin  cancer   Heart attack Father 56   Cancer Father        throat cancer   Arthritis Brother     Allergies  Allergen Reactions   Amlodipine Besylate Swelling    Had a reaction when taking with colcrys    Crestor [Rosuvastatin]     Muscle cramps and pain   Rocephin [Ceftriaxone]     unknown     REVIEW OF SYSTEMS (Negative unless checked)  Constitutional: '[]'$ Weight loss  '[]'$ Fever  '[]'$ Chills Cardiac: '[]'$ Chest pain   '[]'$ Chest pressure   '[]'$ Palpitations   '[]'$ Shortness of breath when laying flat   '[]'$ Shortness of breath with exertion. Vascular:  '[x]'$ Pain in legs with walking   '[]'$ Pain in legs at rest  '[]'$ History of DVT   '[]'$ Phlebitis   '[]'$ Swelling in legs   '[]'$ Varicose veins   '[]'$ Non-healing ulcers Pulmonary:   '[]'$ Uses home oxygen   '[]'$ Productive cough   '[]'$ Hemoptysis   '[]'$ Wheeze  '[]'$ COPD   '[]'$ Asthma Neurologic:  '[]'$ Dizziness   '[]'$ Seizures   '[]'$ History of stroke   '[x]'$ History of TIA  '[]'$ Aphasia   '[x]'$ Vissual changes   '[]'$ Weakness or numbness in arm   '[]'$ Weakness or numbness in leg Musculoskeletal:   '[]'$ Joint swelling   '[]'$ Joint pain   '[]'$ Low back pain Hematologic:  '[]'$ Easy bruising  '[]'$ Easy bleeding   '[]'$ Hypercoagulable state   '[]'$ Anemic Gastrointestinal:  '[]'$ Diarrhea   '[]'$ Vomiting  '[]'$ Gastroesophageal reflux/heartburn   '[]'$ Difficulty swallowing. Genitourinary:  '[x]'$ Chronic kidney disease   '[]'$ Difficult urination  '[]'$ Frequent urination   '[]'$ Blood in urine Skin:  '[]'$ Rashes   '[]'$ Ulcers  Psychological:  '[]'$ History of anxiety   '[]'$  History of major depression.  Physical Examination  There were no vitals filed for this visit. There is no height or weight on file to calculate BMI. Gen: WD/WN, NAD Head: [place]/AT, No temporalis wasting.  Ear/Nose/Throat: Hearing grossly intact, nares w/o erythema or drainage Eyes: PER, EOMI, sclera nonicteric.  Neck: Supple, no masses.  No bruit or JVD.  Pulmonary:  Good air movement, no audible wheezing, no use of accessory muscles.  Cardiac: RRR, normal S1, S2, no Murmurs. Vascular:  carotid bruit  noted Vessel Right Left  Radial Palpable Palpable  Carotid  Palpable  Palpable  Subclav  Palpable Palpable  Gastrointestinal: soft, non-distended. No guarding/no peritoneal signs.  Musculoskeletal: M/S 5/5 throughout.  No visible deformity.  Neurologic: CN 2-12 intact. Pain and light touch intact in extremities.  Symmetrical.  Speech is fluent. Motor exam as listed above. Psychiatric: Judgment intact, Mood & affect appropriate for pt's clinical situation. Dermatologic: No rashes or ulcers noted.  No changes consistent with cellulitis.   CBC Lab Results  Component Value Date   WBC 7.6 03/26/2022   HGB 15.4 03/26/2022   HCT 45.3 03/26/2022   MCV 93 03/26/2022   PLT 187 03/26/2022    BMET    Component Value Date/Time   NA 142 06/25/2022 1512   K 4.3 06/25/2022 1512   CL 102 06/25/2022 1512   CO2 24 06/25/2022 1512   GLUCOSE 73 06/25/2022 1512   GLUCOSE 159 (H) 08/31/2020 1421   BUN 34 (H) 06/25/2022 1512   CREATININE 2.10 (H) 07/03/2022 0908   CALCIUM 9.3 06/25/2022 1512   GFRNONAA 52 (L) 11/21/2020 1505   GFRAA 60 11/21/2020 1505   Estimated Creatinine Clearance: 29.5 mL/min (A) (by C-G formula based on SCr of 2.1 mg/dL (H)).  COAG Lab Results  Component Value Date   INR 1.3 (H) 08/09/2020   INR 1.0 11/12/2019    Radiology CT ANGIO NECK W OR WO CONTRAST  Result Date: 07/03/2022 CLINICAL DATA:  Carotid artery stenosis. Balance disturbance since March. Abnormal carotid ultrasound. EXAM: CT ANGIOGRAPHY NECK TECHNIQUE: Multidetector CT imaging of the neck was performed using the standard protocol during bolus administration of intravenous contrast. Multiplanar CT image reconstructions and MIPs were obtained to evaluate the vascular anatomy. Carotid stenosis measurements (when applicable) are obtained utilizing NASCET criteria, using the distal internal carotid diameter as the denominator. RADIATION DOSE REDUCTION: This exam was performed according to the departmental  dose-optimization program which includes automated exposure control, adjustment of the mA and/or kV according to patient size and/or use of iterative reconstruction technique. CONTRAST:  15m OMNIPAQUE IOHEXOL 350 MG/ML SOLN COMPARISON:  Ultrasound study 04/24/2022 FINDINGS: Aortic arch: Aortic atherosclerosis. Branching pattern is normal without origin stenosis. Right carotid system: Common carotid artery widely patent to the bifurcation. Minimal soft and calcified plaque at the distal ICA bulb with minimal diameter of 3.5 mm. Compared to a more distal cervical ICA diameter of 5 mm, this indicates a 30% stenosis. Cervical ICA widely patent beyond that. Left carotid system: Common carotid artery widely patent to the bifurcation. Calcified plaque at the carotid bifurcation but no stenosis. Soft plaque within the ICA bulb resulting in minimal diameter of 1.5 mm. Compared to a more distal cervical ICA diameter of 5 mm, this indicates a 70% stenosis. ICA widely patent beyond that. Vertebral arteries: Dominant left vertebral artery. Small right vertebral artery. Vertebral artery origins are patent. Vessels are widely patent through the cervical region to the foramen magnum. Right vertebral artery terminates in PICA. Left vertebral artery supplies the basilar. No basilar stenosis. Skeleton: Chronic cervical fusion C6-7. Degenerative spondylosis above and below that. Other neck: Negative Upper chest: Negative IMPRESSION: CT angiography correlates well with the recent ultrasound exam. 30% stenosis of the distal ICA bulb on the right. No flow limiting stenosis. 70% stenosis of the ICA bulb on the left, primarily due to soft plaque, which places the patient at higher risk. Aortic Atherosclerosis (ICD10-I70.0). Electronically Signed   By: MNelson ChimesM.D.   On: 07/03/2022 09:45     Assessment/Plan There are no diagnoses linked to this encounter.   GHortencia Pilar MD  07/05/2022 2:32 PM

## 2022-07-08 ENCOUNTER — Ambulatory Visit (INDEPENDENT_AMBULATORY_CARE_PROVIDER_SITE_OTHER): Payer: Medicare Other | Admitting: Vascular Surgery

## 2022-07-08 VITALS — BP 136/75 | HR 69 | Resp 17 | Ht 70.0 in | Wt 188.2 lb

## 2022-07-08 DIAGNOSIS — E7800 Pure hypercholesterolemia, unspecified: Secondary | ICD-10-CM | POA: Diagnosis not present

## 2022-07-08 DIAGNOSIS — I10 Essential (primary) hypertension: Secondary | ICD-10-CM

## 2022-07-08 DIAGNOSIS — I25119 Atherosclerotic heart disease of native coronary artery with unspecified angina pectoris: Secondary | ICD-10-CM

## 2022-07-08 DIAGNOSIS — R42 Dizziness and giddiness: Secondary | ICD-10-CM | POA: Diagnosis not present

## 2022-07-08 DIAGNOSIS — G479 Sleep disorder, unspecified: Secondary | ICD-10-CM | POA: Diagnosis not present

## 2022-07-08 DIAGNOSIS — I872 Venous insufficiency (chronic) (peripheral): Secondary | ICD-10-CM

## 2022-07-08 DIAGNOSIS — Z7689 Persons encountering health services in other specified circumstances: Secondary | ICD-10-CM | POA: Diagnosis not present

## 2022-07-08 DIAGNOSIS — N1832 Chronic kidney disease, stage 3b: Secondary | ICD-10-CM | POA: Diagnosis not present

## 2022-07-08 DIAGNOSIS — I6523 Occlusion and stenosis of bilateral carotid arteries: Secondary | ICD-10-CM | POA: Diagnosis not present

## 2022-07-08 DIAGNOSIS — I6522 Occlusion and stenosis of left carotid artery: Secondary | ICD-10-CM

## 2022-07-08 DIAGNOSIS — E1122 Type 2 diabetes mellitus with diabetic chronic kidney disease: Secondary | ICD-10-CM

## 2022-07-09 ENCOUNTER — Encounter (INDEPENDENT_AMBULATORY_CARE_PROVIDER_SITE_OTHER): Payer: Self-pay | Admitting: Vascular Surgery

## 2022-07-12 ENCOUNTER — Telehealth (INDEPENDENT_AMBULATORY_CARE_PROVIDER_SITE_OTHER): Payer: Self-pay

## 2022-07-12 NOTE — Telephone Encounter (Addendum)
I spoke with the patient's spouse and requested that she have the patient to call and give the okay for me to speak with her regarding getting him scheduled for a left carotid stent placement with Dr. Delana Meyer. Patient called back and agreed to 07/23/22 for his left carotid stent placement with a 9:00 am arrival time to the MM. Pre-procedure instructions were discussed and will be mailed.

## 2022-07-15 ENCOUNTER — Other Ambulatory Visit: Payer: Self-pay | Admitting: Family Medicine

## 2022-07-15 DIAGNOSIS — L409 Psoriasis, unspecified: Secondary | ICD-10-CM

## 2022-07-15 NOTE — Telephone Encounter (Signed)
Last refill: 02/23/2021 qty 453g with 1 refill  Last office visit: 06/25/2022  Next ov: 08/02/2022 with Dr. Quentin Cornwall

## 2022-07-23 ENCOUNTER — Encounter
Admission: RE | Disposition: A | Discharge status: Home / Self Care | Payer: Self-pay | Source: Ambulatory Visit | Attending: Vascular Surgery

## 2022-07-23 ENCOUNTER — Encounter: Payer: Self-pay | Admitting: Vascular Surgery

## 2022-07-23 ENCOUNTER — Ambulatory Visit: Payer: Medicare Other | Admitting: Family Medicine

## 2022-07-23 ENCOUNTER — Inpatient Hospital Stay
Admission: RE | Admit: 2022-07-23 | Discharge: 2022-07-24 | DRG: 035 | Disposition: A | Discharge status: Home / Self Care | Payer: Medicare Other | Source: Ambulatory Visit | Attending: Vascular Surgery | Admitting: Vascular Surgery

## 2022-07-23 ENCOUNTER — Other Ambulatory Visit: Payer: Self-pay

## 2022-07-23 DIAGNOSIS — I429 Cardiomyopathy, unspecified: Secondary | ICD-10-CM | POA: Diagnosis not present

## 2022-07-23 DIAGNOSIS — Z955 Presence of coronary angioplasty implant and graft: Secondary | ICD-10-CM | POA: Diagnosis not present

## 2022-07-23 DIAGNOSIS — Z808 Family history of malignant neoplasm of other organs or systems: Secondary | ICD-10-CM

## 2022-07-23 DIAGNOSIS — E1122 Type 2 diabetes mellitus with diabetic chronic kidney disease: Secondary | ICD-10-CM | POA: Diagnosis present

## 2022-07-23 DIAGNOSIS — Z8551 Personal history of malignant neoplasm of bladder: Secondary | ICD-10-CM

## 2022-07-23 DIAGNOSIS — Q2549 Other congenital malformations of aorta: Secondary | ICD-10-CM

## 2022-07-23 DIAGNOSIS — Z85820 Personal history of malignant melanoma of skin: Secondary | ICD-10-CM

## 2022-07-23 DIAGNOSIS — K219 Gastro-esophageal reflux disease without esophagitis: Secondary | ICD-10-CM | POA: Diagnosis present

## 2022-07-23 DIAGNOSIS — Z888 Allergy status to other drugs, medicaments and biological substances status: Secondary | ICD-10-CM | POA: Diagnosis not present

## 2022-07-23 DIAGNOSIS — G4730 Sleep apnea, unspecified: Secondary | ICD-10-CM | POA: Diagnosis present

## 2022-07-23 DIAGNOSIS — Z7901 Long term (current) use of anticoagulants: Secondary | ICD-10-CM

## 2022-07-23 DIAGNOSIS — J449 Chronic obstructive pulmonary disease, unspecified: Secondary | ICD-10-CM | POA: Diagnosis not present

## 2022-07-23 DIAGNOSIS — E7800 Pure hypercholesterolemia, unspecified: Secondary | ICD-10-CM | POA: Diagnosis not present

## 2022-07-23 DIAGNOSIS — I129 Hypertensive chronic kidney disease with stage 1 through stage 4 chronic kidney disease, or unspecified chronic kidney disease: Secondary | ICD-10-CM | POA: Diagnosis not present

## 2022-07-23 DIAGNOSIS — M109 Gout, unspecified: Secondary | ICD-10-CM | POA: Diagnosis present

## 2022-07-23 DIAGNOSIS — Z951 Presence of aortocoronary bypass graft: Secondary | ICD-10-CM | POA: Diagnosis not present

## 2022-07-23 DIAGNOSIS — Z8673 Personal history of transient ischemic attack (TIA), and cerebral infarction without residual deficits: Secondary | ICD-10-CM | POA: Diagnosis not present

## 2022-07-23 DIAGNOSIS — Z87891 Personal history of nicotine dependence: Secondary | ICD-10-CM | POA: Diagnosis not present

## 2022-07-23 DIAGNOSIS — Z95810 Presence of automatic (implantable) cardiac defibrillator: Secondary | ICD-10-CM | POA: Diagnosis not present

## 2022-07-23 DIAGNOSIS — Z8249 Family history of ischemic heart disease and other diseases of the circulatory system: Secondary | ICD-10-CM

## 2022-07-23 DIAGNOSIS — Z881 Allergy status to other antibiotic agents status: Secondary | ICD-10-CM | POA: Diagnosis not present

## 2022-07-23 DIAGNOSIS — I872 Venous insufficiency (chronic) (peripheral): Secondary | ICD-10-CM | POA: Diagnosis not present

## 2022-07-23 DIAGNOSIS — I252 Old myocardial infarction: Secondary | ICD-10-CM | POA: Diagnosis not present

## 2022-07-23 DIAGNOSIS — I2510 Atherosclerotic heart disease of native coronary artery without angina pectoris: Secondary | ICD-10-CM | POA: Diagnosis not present

## 2022-07-23 DIAGNOSIS — I6522 Occlusion and stenosis of left carotid artery: Secondary | ICD-10-CM | POA: Diagnosis not present

## 2022-07-23 DIAGNOSIS — Z7984 Long term (current) use of oral hypoglycemic drugs: Secondary | ICD-10-CM

## 2022-07-23 DIAGNOSIS — N1832 Chronic kidney disease, stage 3b: Secondary | ICD-10-CM | POA: Diagnosis not present

## 2022-07-23 DIAGNOSIS — Z7982 Long term (current) use of aspirin: Secondary | ICD-10-CM

## 2022-07-23 DIAGNOSIS — Z79899 Other long term (current) drug therapy: Secondary | ICD-10-CM

## 2022-07-23 HISTORY — PX: CAROTID PTA/STENT INTERVENTION: CATH118231

## 2022-07-23 LAB — GLUCOSE, CAPILLARY
Glucose-Capillary: 119 mg/dL — ABNORMAL HIGH (ref 70–99)
Glucose-Capillary: 119 mg/dL — ABNORMAL HIGH (ref 70–99)
Glucose-Capillary: 117 mg/dL — ABNORMAL HIGH (ref 70–99)

## 2022-07-23 LAB — BUN: BUN: 41 mg/dL — ABNORMAL HIGH (ref 8–23)

## 2022-07-23 LAB — CREATININE, SERUM
Creatinine, Ser: 2.08 mg/dL — ABNORMAL HIGH (ref 0.61–1.24)
GFR, Estimated: 32 mL/min — ABNORMAL LOW (ref >60)

## 2022-07-23 SURGERY — CAROTID PTA/STENT INTERVENTION
Anesthesia: Moderate Sedation | Laterality: Left

## 2022-07-23 MED ORDER — SODIUM CHLORIDE 0.9% FLUSH
3.0000 mL | Freq: Two times a day (BID) | INTRAVENOUS | Status: DC
  Administered 2022-07-23 – 2022-07-24 (×3): 3 mL via INTRAVENOUS

## 2022-07-23 MED ORDER — ACETAMINOPHEN 325 MG PO TABS: 650.0000 mg | ORAL_TABLET | ORAL | Status: DC | PRN

## 2022-07-23 MED ORDER — SODIUM CHLORIDE FLUSH 0.9 % IV SOLN
INTRAVENOUS | Status: AC
  Filled 2022-07-23: qty 20

## 2022-07-23 MED ORDER — ONDANSETRON HCL 4 MG/2ML IJ SOLN: 4.0000 mg | Freq: Four times a day (QID) | INTRAMUSCULAR | Status: DC | PRN

## 2022-07-23 MED ORDER — ASPIRIN 81 MG PO TBEC
81.0000 mg | DELAYED_RELEASE_TABLET | Freq: Every day | ORAL | Status: DC
  Administered 2022-07-24: 81 mg via ORAL
  Filled 2022-07-23 (×2): qty 1

## 2022-07-23 MED ORDER — SODIUM CHLORIDE 0.9 % IV SOLN: 250.0000 mL | INTRAVENOUS | Status: DC | PRN

## 2022-07-23 MED ORDER — DIPHENHYDRAMINE HCL 50 MG/ML IJ SOLN: 50.0000 mg | Freq: Once | INTRAMUSCULAR | Status: DC | PRN

## 2022-07-23 MED ORDER — SODIUM CHLORIDE 0.9 % IV SOLN: INTRAVENOUS | Status: AC

## 2022-07-23 MED ORDER — MIDAZOLAM HCL 2 MG/2ML IJ SOLN
INTRAMUSCULAR | Status: AC
  Filled 2022-07-23: qty 2

## 2022-07-23 MED ORDER — CLOPIDOGREL BISULFATE 75 MG PO TABS
300.0000 mg | ORAL_TABLET | ORAL | Status: AC
  Administered 2022-07-23: 300 mg via ORAL
  Filled 2022-07-23: qty 4

## 2022-07-23 MED ORDER — LABETALOL HCL 5 MG/ML IV SOLN: 10.0000 mg | INTRAVENOUS | Status: DC | PRN

## 2022-07-23 MED ORDER — MIDAZOLAM HCL 2 MG/2ML IJ SOLN
INTRAMUSCULAR | Status: DC | PRN
  Administered 2022-07-23: 2 mg via INTRAVENOUS

## 2022-07-23 MED ORDER — ATORVASTATIN CALCIUM 20 MG PO TABS
40.0000 mg | ORAL_TABLET | Freq: Every evening | ORAL | Status: DC
  Administered 2022-07-23: 40 mg via ORAL
  Filled 2022-07-23: qty 2

## 2022-07-23 MED ORDER — PHENYLEPHRINE 80 MCG/ML (10ML) SYRINGE FOR IV PUSH (FOR BLOOD PRESSURE SUPPORT)
PREFILLED_SYRINGE | INTRAVENOUS | Status: AC
  Filled 2022-07-23: qty 10

## 2022-07-23 MED ORDER — ONDANSETRON HCL 4 MG/2ML IJ SOLN
4.0000 mg | Freq: Four times a day (QID) | INTRAMUSCULAR | Status: DC | PRN
  Administered 2022-07-23: 4 mg via INTRAVENOUS
  Filled 2022-07-23: qty 2

## 2022-07-23 MED ORDER — LOSARTAN POTASSIUM 50 MG PO TABS
50.0000 mg | ORAL_TABLET | Freq: Every day | ORAL | Status: DC
  Administered 2022-07-24: 50 mg via ORAL
  Filled 2022-07-23: qty 1

## 2022-07-23 MED ORDER — PHENYLEPHRINE HCL-NACL 20-0.9 MG/250ML-% IV SOLN
INTRAVENOUS | Status: AC
  Filled 2022-07-23: qty 250

## 2022-07-23 MED ORDER — FENTANYL CITRATE PF 50 MCG/ML IJ SOSY
PREFILLED_SYRINGE | INTRAMUSCULAR | Status: AC
  Filled 2022-07-23: qty 1

## 2022-07-23 MED ORDER — HYDROMORPHONE HCL 1 MG/ML IJ SOLN: 1.0000 mg | Freq: Once | INTRAMUSCULAR | Status: DC | PRN

## 2022-07-23 MED ORDER — FENTANYL CITRATE (PF) 100 MCG/2ML IJ SOLN
INTRAMUSCULAR | Status: DC | PRN
Start: 2022-07-23 — End: 2022-07-23
  Administered 2022-07-23: 50 ug via INTRAVENOUS

## 2022-07-23 MED ORDER — INSULIN ASPART 100 UNIT/ML IJ SOLN: 0.0000 [IU] | Freq: Three times a day (TID) | INTRAMUSCULAR | Status: DC

## 2022-07-23 MED ORDER — CHLORHEXIDINE GLUCONATE CLOTH 2 % EX PADS: 6.0000 | MEDICATED_PAD | Freq: Every day | CUTANEOUS | Status: DC

## 2022-07-23 MED ORDER — MAGNESIUM OXIDE 400 MG PO TABS
400.0000 mg | ORAL_TABLET | Freq: Two times a day (BID) | ORAL | Status: DC
  Administered 2022-07-23 – 2022-07-24 (×2): 400 mg via ORAL
  Filled 2022-07-23 (×4): qty 1

## 2022-07-23 MED ORDER — DOPAMINE-DEXTROSE 3.2-5 MG/ML-% IV SOLN: 0.0000 ug/kg/min | INTRAVENOUS | Status: DC

## 2022-07-23 MED ORDER — SODIUM CHLORIDE FLUSH 0.9 % IV SOLN
INTRAVENOUS | Status: AC
  Filled 2022-07-23: qty 10

## 2022-07-23 MED ORDER — CARVEDILOL 6.25 MG PO TABS
6.2500 mg | ORAL_TABLET | Freq: Two times a day (BID) | ORAL | Status: DC
  Administered 2022-07-24: 6.25 mg via ORAL
  Filled 2022-07-23 (×2): qty 1

## 2022-07-23 MED ORDER — SODIUM CHLORIDE 0.9 % IV SOLN: INTRAVENOUS | Status: DC

## 2022-07-23 MED ORDER — MORPHINE SULFATE (PF) 4 MG/ML IV SOLN: 2.0000 mg | INTRAVENOUS | Status: DC | PRN

## 2022-07-23 MED ORDER — METHYLPREDNISOLONE SODIUM SUCC 125 MG IJ SOLR: 125.0000 mg | Freq: Once | INTRAMUSCULAR | Status: DC | PRN

## 2022-07-23 MED ORDER — OXYCODONE HCL 5 MG PO TABS: 5.0000 mg | ORAL_TABLET | ORAL | Status: DC | PRN

## 2022-07-23 MED ORDER — SODIUM CHLORIDE 0.9% FLUSH: 3.0000 mL | INTRAVENOUS | Status: DC | PRN

## 2022-07-23 MED ORDER — VANCOMYCIN HCL IN DEXTROSE 1-5 GM/200ML-% IV SOLN
1000.0000 mg | INTRAVENOUS | Status: DC
  Filled 2022-07-23: qty 200

## 2022-07-23 MED ORDER — HEPARIN SODIUM (PORCINE) 1000 UNIT/ML IJ SOLN
INTRAMUSCULAR | Status: AC
  Filled 2022-07-23: qty 20

## 2022-07-23 MED ORDER — IODIXANOL 320 MG/ML IV SOLN
INTRAVENOUS | Status: DC | PRN
  Administered 2022-07-23: 45 mL

## 2022-07-23 MED ORDER — CALCIUM CARBONATE ANTACID 500 MG PO CHEW
1.0000 | CHEWABLE_TABLET | Freq: Every day | ORAL | Status: DC
  Administered 2022-07-24: 200 mg via ORAL
  Filled 2022-07-23: qty 1

## 2022-07-23 MED ORDER — FAMOTIDINE 20 MG PO TABS: 40.0000 mg | ORAL_TABLET | Freq: Once | ORAL | Status: DC | PRN

## 2022-07-23 MED ORDER — DOPAMINE-DEXTROSE 3.2-5 MG/ML-% IV SOLN
INTRAVENOUS | Status: AC
  Filled 2022-07-23: qty 250

## 2022-07-23 MED ORDER — HEPARIN SODIUM (PORCINE) 1000 UNIT/ML IJ SOLN
INTRAMUSCULAR | Status: DC | PRN
  Administered 2022-07-23: 10000 [IU] via INTRAVENOUS
  Administered 2022-07-23: 2000 [IU] via INTRAVENOUS

## 2022-07-23 MED ORDER — MIDAZOLAM HCL 2 MG/ML PO SYRP: 8.0000 mg | ORAL_SOLUTION | Freq: Once | ORAL | Status: DC | PRN

## 2022-07-23 MED ORDER — HYDRALAZINE HCL 20 MG/ML IJ SOLN: 5.0000 mg | INTRAMUSCULAR | Status: DC | PRN

## 2022-07-23 MED ORDER — ATROPINE SULFATE 1 MG/ML IV SOLN
INTRAVENOUS | Status: DC | PRN
  Administered 2022-07-23: 1 mg via INTRAVENOUS

## 2022-07-23 MED ORDER — ATROPINE SULFATE 1 MG/10ML IJ SOSY
PREFILLED_SYRINGE | INTRAMUSCULAR | Status: AC
  Filled 2022-07-23: qty 10

## 2022-07-23 MED ORDER — DOPAMINE-DEXTROSE 3.2-5 MG/ML-% IV SOLN
INTRAVENOUS | Status: AC
  Administered 2022-07-23: 6 ug/kg/min via INTRAVENOUS
  Filled 2022-07-23: qty 250

## 2022-07-23 SURGICAL SUPPLY — 28 items
BALLN VIATRAC 5X30X135 (BALLOONS) ×1
BALLOON VIATRAC 5X30X135 (BALLOONS) IMPLANT
CATH ANGIO 5F PIGTAIL 100CM (CATHETERS) IMPLANT
CATH ANGIO 5F SIM1 100CM (CATHETERS) IMPLANT
CATH BEACON 5 .035 100 H1 TIP (CATHETERS) IMPLANT
CATH BEACON 5 .035 40 KMP TP (CATHETERS) IMPLANT
CATH BEACON 5 .038 40 KMP TP (CATHETERS) ×1
COVER DRAPE FLUORO 36X44 (DRAPES) IMPLANT
DEVICE EMBOSHIELD NAV6 4.0-7.0 (FILTER) IMPLANT
DEVICE SAFEGUARD 24CM (GAUZE/BANDAGES/DRESSINGS) IMPLANT
DEVICE STARCLOSE SE CLOSURE (Vascular Products) IMPLANT
DEVICE TORQUE .025-.038 (MISCELLANEOUS) IMPLANT
GLIDEWIRE ANGLED SS 035X260CM (WIRE) IMPLANT
GUIDEWIRE AMPLATZ SHORT (WIRE) IMPLANT
GUIDEWIRE VASC STIFF .038X260 (WIRE) IMPLANT
KIT CAROTID MANIFOLD (MISCELLANEOUS) IMPLANT
KIT ENCORE 26 ADVANTAGE (KITS) IMPLANT
KIT MICROPUNCTURE NIT STIFF (SHEATH) IMPLANT
NDL ENTRY 21GA 7CM ECHOTIP (NEEDLE) IMPLANT
NEEDLE ENTRY 21GA 7CM ECHOTIP (NEEDLE) ×1 IMPLANT
PACK ANGIOGRAPHY (CUSTOM PROCEDURE TRAY) ×1 IMPLANT
SET INTRO CAPELLA COAXIAL (SET/KITS/TRAYS/PACK) IMPLANT
SHEATH BRITE TIP 6FRX11 (SHEATH) IMPLANT
SHEATH NEURON MAX 6FR 90CM (SHEATH) IMPLANT
SHEATH PROBE COVER 6X72 (BAG) IMPLANT
STENT XACT CAR 10-8X40X136 (Permanent Stent) IMPLANT
SYR MEDRAD MARK 7 150ML (SYRINGE) IMPLANT
WIRE GUIDERIGHT .035X150 (WIRE) IMPLANT

## 2022-07-23 NOTE — Op Note (Signed)
OPERATIVE NOTE DATE: 07/23/2022  PROCEDURE:  Ultrasound guidance for vascular access right femoral artery  Placement of a 10 x 8 x 40 exact stent with the use of the NAV-6 embolic protection device in the left internal carotid artery. Angioplasty of the exact stent to 5 mm with a 5 mm x 30 mm balloon  PRE-OPERATIVE DIAGNOSIS: 1.  Symptomatic left internal carotid artery stenosis.   POST-OPERATIVE DIAGNOSIS:  Same as above  SURGEON: Hortencia Pilar  ASSISTANT(S): None  ANESTHESIA: local/MCS  ESTIMATED BLOOD LOSS: 50 cc  CONTRAST: 45 cc  FLUORO TIME: 12.8 minutes  MODERATE CONSCIOUS SEDATION TIME: Continuous ECG pulse oximetry and cardiopulmonary monitoring was performed throughout the entire procedure by the interventional radiology nurse total sedation time was 58 minutes and 16 seconds.  FINDING(S): 1.   Greater than 80% left internal carotid artery stenosis  SPECIMEN(S):   none  INDICATIONS:   Patient is a 81 y.o. male who presents with greater than 80% symptomatic left internal carotid artery stenosis.  The patient has multiple comorbidities including coronary artery disease cardiomyopathy and COPD and carotid artery stenting was felt to be preferred to endarterectomy for that reason.  Risks and benefits were discussed and informed consent was obtained.   DESCRIPTION: After obtaining full informed written consent, the patient was brought back to the vascular suite and placed supine upon the table.  The patient received IV antibiotics prior to induction. Moderate conscious sedation was administered during a face to face encounter with the patient throughout the procedure with my supervision of the RN administering medicines and monitoring the patients vital signs and mental status throughout from the start of the procedure until the patient was taken to the recovery room.    After obtaining adequate sedation, the patient was prepped and draped in the standard fashion.     A first assistant is required in order to allow for a safe and more efficient operation.  Duties include wire manipulations as well as assistance with pinning the sheath and positioning the detector for proper angle, assistance and deploying the stent in the proper position and appropriate images.  Further duties include assisting with patient positioning during the procedure.  I believe that this procedure requires a first assistant in order for it to be performed at a level in keeping with the high standards of this institution.  The right femoral artery was visualized with ultrasound and found to be widely patent. It was then accessed under direct ultrasound guidance without difficulty with a micropuncture needle. A permanent image was recorded.  A microwire was then advanced without difficulty under fluoroscopic guidance followed by a micro-sheath.  A J-wire was placed and we then placed a 6 French sheath. The patient was then heparinized and [place] [id number] units of intravenous heparin were given and an ACT was checked to confirm successful anticoagulation.   A pigtail catheter was then placed into the ascending aorta. This showed bovine arch anatomy.  The origins of the great vessels were all widely patent. The left common carotid artery was then selectively cannulated without difficulty with a Simmons 1 catheter and the catheter advanced into the mid left common carotid artery.  Cervical and cerebral carotid angiography was then performed. There were no obvious intracranial filling defects. The carotid bifurcation demonstrated greater than 85% stenosis of the left internal carotid artery.  I then advanced into the external carotid artery with a Glidewire and the Simmons 1 catheter and then exchanged for the Amplatz  Super Stiff wire. Over the Amplatz Super Stiff wire, a 6 [place] shuttle sheath was placed into the mid common carotid artery. I then used the NAV-6  Embolic protection device and crossed  the lesion and parked this in the distal internal carotid artery at the base of the skull.  I then selected a 10 mm x 8 mm x 40 mm exact stent. This was deployed across the lesion encompassing it in its entirety. A 5 mm x 30 mm length balloon was used to post dilate the stent. Only about a 5% residual stenosis was present after angioplasty. Completion angiogram showed normal intracranial filling without new defects. At this point I elected to terminate the procedure. The sheath was removed and StarClose closure device was deployed in the right femoral artery with excellent hemostatic result. The patient was taken to the recovery room in stable condition having tolerated the procedure well.  COMPLICATIONS: none  CONDITION: stable  Hortencia Pilar 07/23/2022 11:24 AM   This note was created with Dragon Medical transcription system. Any errors in dictation are purely unintentional.

## 2022-07-24 ENCOUNTER — Encounter: Payer: Self-pay | Admitting: Vascular Surgery

## 2022-07-24 DIAGNOSIS — I6522 Occlusion and stenosis of left carotid artery: Principal | ICD-10-CM | POA: Diagnosis present

## 2022-07-24 LAB — GLUCOSE, CAPILLARY
Glucose-Capillary: 116 mg/dL — ABNORMAL HIGH (ref 70–99)
Glucose-Capillary: 182 mg/dL — ABNORMAL HIGH (ref 70–99)

## 2022-07-24 MED ORDER — APIXABAN 5 MG PO TABS
10.0000 mg | ORAL_TABLET | ORAL | Status: AC
  Administered 2022-07-24: 10 mg via ORAL
  Filled 2022-07-24: qty 2

## 2022-07-24 MED ORDER — APIXABAN 5 MG PO TABS
5.0000 mg | ORAL_TABLET | Freq: Two times a day (BID) | ORAL | 11 refills | Status: DC
Start: 2022-07-24 — End: 2022-11-02

## 2022-07-24 NOTE — H&P (Signed)
MRN : [id number]   Taylor [name] is a 81 y.o. (1941-02-22) male who presents with chief complaint of check carotid arteries.   History of Present Illness:    The patient is seen for follow up evaluation of carotid stenosis status post CT angiogram. CT scan was done 07/03/2022. Patient reports that the test went well with no problems or complications.  He reports he continues to have episodes of dizziness and syncope.   CT results per radiology:  CT angiography correlates well with the recent ultrasound exam. 30% stenosis of the distal ICA bulb on the right. No flow limiting stenosis. 70% stenosis of the ICA bulb on the left, primarily due to soft plaque, which places the patient at higher risk.   The patient denies interval amaurosis fugax. There is no recent or interval TIA symptoms or focal motor deficits. There is no prior documented CVA.   The patient is taking enteric-coated aspirin 81 mg daily.   There is no history of migraine headaches. There is no history of seizures.   No recent shortening of the patient's walking distance or new symptoms consistent with claudication.  No history of rest pain symptoms. No new ulcers or wounds of the lower extremities have occurred.   There is no history of DVT, PE or superficial thrombophlebitis. No recent episodes of angina or shortness of breath documented.    CT angiogram is reviewed by me personally and shows less than 30% right internal carotid artery stenosis, on the axial images I estimate the left internal carotid artery stenosis to be 80% perhaps greater.  However, on the sagittal and coronal views this may be secondary to tortuosity.  Stenosis consistent with soft plaque at the origin of the left internal carotid artery.  Given the axial images as well as the results from the duplex ultrasound angiography is warranted and if it confirmes a greater than 75% stenosis I would proceed with stenting.   Active Medications  No  outpatient medications have been marked as taking for the 07/08/22 encounter (Appointment) with Delana Meyer, Dolores Lory, MD.            Past Medical History:  Diagnosis Date   AAA (abdominal aortic aneurysm) ([place]) 06/03/2007    [facility]; Dr. Kellie Simmering   AICD (automatic cardioverter/defibrillator) present     Barrett's esophagus     Bladder cancer [facility])     Bradycardia     CAD (coronary artery disease)     CHF (congestive heart failure) (HCC)     Cluster headache     DDD (degenerative disc disease), lumbar     Dyspnea      WITH EXERTION   Edema      LEFT ANKLE   Fracture of skull base ([place]) 1997    due to fall   GERD (gastroesophageal reflux disease)     Gout     History of bladder cancer 12/1995   Hyperlipidemia     Hypertension     Malignant melanoma ([place]) 12/2012    right dorsal forearm excised   Myocardial infarction [facility])      LAST 2014   Osteoarthritis of knee     Pacemaker 10/10/2006   Pneumonia      2016   Pre-diabetes     Psoriasis     Rib fracture 1997    due to fall   Sleep apnea      CPAP   Stroke ([place])  Venous incompetence             Past Surgical History:  Procedure Laterality Date   ABDOMINAL AORTIC ANEURYSM REPAIR   06/03/2007    [facility]; Dr. Kellie Simmering   ANGIOPLASTY   1994    [place]   BLADDER TUMOR EXCISION   12/1995   CATARACT EXTRACTION W/PHACO Left 10/22/2016    Procedure: CATARACT EXTRACTION PHACO AND INTRAOCULAR LENS PLACEMENT ([place]);  Surgeon: Birder Robson, MD;  Location: [facility] ORS;  Service: Ophthalmology;  Laterality: Left;  [place] 47.7 AP% 18.4 CDE 8.78 Fluid pack lot # [id number]   CATARACT EXTRACTION W/PHACO Right 12/10/2016    Procedure: CATARACT EXTRACTION PHACO AND INTRAOCULAR LENS PLACEMENT (IOC);  Surgeon: Birder Robson, MD;  Location: [facility] ORS;  Service: Ophthalmology;  Laterality: Right;  [place] 00:39 AP% 23.3 CDE 9.13 Fluid pack lot # [id number]   CORONARY ANGIOPLASTY        STENTS X 5    CORONARY ARTERY BYPASS GRAFT   09/22/2006    four   ELBOW BURSA SURGERY        DUE TO GOUT   INSERT / REPLACE / REMOVE PACEMAKER       MELANOMA EXCISION   12/2012    Right forearm      Social History Social History         Tobacco Use   Smoking status: Former      Packs/day: 1.00      Types: Cigarettes      Quit date: 11/26/1999      Years since quitting: 22.6   Smokeless tobacco: Never  Vaping Use   Vaping Use: Never used  Substance Use Topics   Alcohol use: No   Drug use: No      Family History      Family History  Problem Relation Age of Onset   Cancer Mother          Melanoma skin cancer   Heart attack Father 35   Cancer Father          throat cancer   Arthritis Brother             Allergies  Allergen Reactions   Amlodipine Besylate Swelling      Had a reaction when taking with colcrys    Crestor [Rosuvastatin]        Muscle cramps and pain   Rocephin [Ceftriaxone]        unknown        REVIEW OF SYSTEMS (Negative unless checked)   Constitutional: '[]'$ Weight loss  '[]'$ Fever  '[]'$ Chills Cardiac: '[]'$ Chest pain   '[]'$ Chest pressure   '[]'$ Palpitations   '[]'$ Shortness of breath when laying flat   '[]'$ Shortness of breath with exertion. Vascular:  '[x]'$ Pain in legs with walking   '[]'$ Pain in legs at rest  '[]'$ History of DVT   '[]'$ Phlebitis   '[]'$ Swelling in legs   '[]'$ Varicose veins   '[]'$ Non-healing ulcers Pulmonary:   '[]'$ Uses home oxygen   '[]'$ Productive cough   '[]'$ Hemoptysis   '[]'$ Wheeze  '[]'$ COPD   '[]'$ Asthma Neurologic:  '[]'$ Dizziness   '[]'$ Seizures   '[]'$ History of stroke   '[x]'$ History of TIA  '[]'$ Aphasia   '[x]'$ Vissual changes   '[]'$ Weakness or numbness in arm   '[]'$ Weakness or numbness in leg Musculoskeletal:   '[]'$ Joint swelling   '[]'$ Joint pain   '[]'$ Low back pain Hematologic:  '[]'$ Easy bruising  '[]'$ Easy bleeding   '[]'$ Hypercoagulable state   '[]'$ Anemic Gastrointestinal:  '[]'$ Diarrhea   '[]'$ Vomiting  '[]'$ Gastroesophageal reflux/heartburn   '[]'$ Difficulty swallowing.  Genitourinary:  '[x]'$ Chronic kidney disease   '[]'$ Difficult  urination  '[]'$ Frequent urination   '[]'$ Blood in urine Skin:  '[]'$ Rashes   '[]'$ Ulcers  Psychological:  '[]'$ History of anxiety   '[]'$  History of major depression.   Physical Examination   There were no vitals filed for this visit. There is no height or weight on file to calculate BMI. Gen: WD/WN, NAD Head: [place]/AT, No temporalis wasting.  Ear/Nose/Throat: Hearing grossly intact, nares w/o erythema or drainage Eyes: PER, EOMI, sclera nonicteric.  Neck: Supple, no masses.  No bruit or JVD.  Pulmonary:  Good air movement, no audible wheezing, no use of accessory muscles.  Cardiac: RRR, normal S1, S2, no Murmurs. Vascular:  carotid bruit noted Vessel Right Left  Radial Palpable Palpable  Carotid  Palpable  Palpable  Subclav  Palpable Palpable  Gastrointestinal: soft, non-distended. No guarding/no peritoneal signs.  Musculoskeletal: M/S 5/5 throughout.  No visible deformity.  Neurologic: CN 2-12 intact. Pain and light touch intact in extremities.  Symmetrical.  Speech is fluent. Motor exam as listed above. Psychiatric: Judgment intact, Mood & affect appropriate for pt's clinical situation. Dermatologic: No rashes or ulcers noted.  No changes consistent with cellulitis.     CBC Recent Labs       Lab Results  Component Value Date    WBC 7.6 03/26/2022    HGB 15.4 03/26/2022    HCT 45.3 03/26/2022    MCV 93 03/26/2022    PLT 187 03/26/2022        BMET Labs (Brief)          Component Value Date/Time    NA 142 06/25/2022 1512    K 4.3 06/25/2022 1512    CL 102 06/25/2022 1512    CO2 24 06/25/2022 1512    GLUCOSE 73 06/25/2022 1512    GLUCOSE 159 (H) 08/31/2020 1421    BUN 34 (H) 06/25/2022 1512    CREATININE 2.10 (H) 07/03/2022 0908    CALCIUM 9.3 06/25/2022 1512    GFRNONAA 52 (L) 11/21/2020 1505    GFRAA 60 11/21/2020 1505      Estimated Creatinine Clearance: 29.5 mL/min (A) (by C-G formula based on SCr of 2.1 mg/dL (H)).   COAG Recent Labs       Lab Results  Component Value  Date    INR 1.3 (H) 08/09/2020    INR 1.0 11/12/2019        Radiology  Imaging Results  CT ANGIO NECK W OR WO CONTRAST   Result Date: 07/03/2022 CLINICAL DATA:  Carotid artery stenosis. Balance disturbance since March. Abnormal carotid ultrasound. EXAM: CT ANGIOGRAPHY NECK TECHNIQUE: Multidetector CT imaging of the neck was performed using the standard protocol during bolus administration of intravenous contrast. Multiplanar CT image reconstructions and MIPs were obtained to evaluate the vascular anatomy. Carotid stenosis measurements (when applicable) are obtained utilizing NASCET criteria, using the distal internal carotid diameter as the denominator. RADIATION DOSE REDUCTION: This exam was performed according to the departmental dose-optimization program which includes automated exposure control, adjustment of the mA and/or kV according to patient size and/or use of iterative reconstruction technique. CONTRAST:  75m OMNIPAQUE IOHEXOL 350 MG/ML SOLN COMPARISON:  Ultrasound study 04/24/2022 FINDINGS: Aortic arch: Aortic atherosclerosis. Branching pattern is normal without origin stenosis. Right carotid system: Common carotid artery widely patent to the bifurcation. Minimal soft and calcified plaque at the distal ICA bulb with minimal diameter of 3.5 mm. Compared to a more distal cervical ICA diameter of 5 mm, this indicates a 30%  stenosis. Cervical ICA widely patent beyond that. Left carotid system: Common carotid artery widely patent to the bifurcation. Calcified plaque at the carotid bifurcation but no stenosis. Soft plaque within the ICA bulb resulting in minimal diameter of 1.5 mm. Compared to a more distal cervical ICA diameter of 5 mm, this indicates a 70% stenosis. ICA widely patent beyond that. Vertebral arteries: Dominant left vertebral artery. Small right vertebral artery. Vertebral artery origins are patent. Vessels are widely patent through the cervical region to the foramen magnum. Right  vertebral artery terminates in PICA. Left vertebral artery supplies the basilar. No basilar stenosis. Skeleton: Chronic cervical fusion C6-7. Degenerative spondylosis above and below that. Other neck: Negative Upper chest: Negative IMPRESSION: CT angiography correlates well with the recent ultrasound exam. 30% stenosis of the distal ICA bulb on the right. No flow limiting stenosis. 70% stenosis of the ICA bulb on the left, primarily due to soft plaque, which places the patient at higher risk. Aortic Atherosclerosis (ICD10-I70.0). Electronically Signed   By: Nelson Chimes M.D.   On: 07/03/2022 09:45        Assessment/Plan 1. Symptomatic carotid artery narrowing without infarction, left Recommend:   The patient is symptomatic with respect to the carotid stenosis.  CT angiogram is reviewed by me personally and shows less than 30% right internal carotid artery stenosis, on the axial images I estimate the left internal carotid artery stenosis to be 80% perhaps greater.  However, on the sagittal and coronal views this may be secondary to tortuosity.  Stenosis consistent with soft plaque at the origin of the left internal carotid artery.  Given the axial images as well as the results from the duplex ultrasound angiography is warranted and if it confirmes a greater than 75% stenosis I would proceed with stenting.   Patient's CT angiography of the carotid arteries confirms >75% left ICA stenosis, as described above.  The anatomical considerations support stenting over surgery.  This was discussed in detail with the patient.   The risks, benefits and alternative therapies were reviewed in detail with the patient.  All questions were answered.  The patient agrees to proceed with stenting of the left carotid artery.   Continue antiplatelet therapy as prescribed. Continue management of CAD, HTN and Hyperlipidemia. Healthy heart diet, encouraged exercise at least 4 times per week.     2. Chronic venous  insufficiency No surgery or intervention at this point in time.     I have discussed with the patient venous insufficiency and why it  causes symptoms. I have discussed with the patient the chronic skin changes that accompany venous insufficiency and the long term sequela such as infection and ulceration.  Patient will begin wearing graduated compression stockings or compression wraps on a daily basis.  The patient will put the compression on first thing in the morning and removing them in the evening. The patient is instructed specifically not to sleep in the compression.     In addition, behavioral modification including several periods of elevation of the lower extremities during the day will be continued. I have demonstrated that proper elevation is a position with the ankles at heart level.   The patient is instructed to begin routine exercise, especially walking on a daily basis   The patient will be assessed for a Lymph Pump depending on the effectiveness of conservative therapy and the control of the associated lymphedema.    3. Essential (primary) hypertension Continue antihypertensive medications as already ordered, these medications have been  reviewed and there are no changes at this time.    4. Atherosclerosis of native coronary artery of native heart with angina pectoris ([place]) Continue cardiac and antihypertensive medications as already ordered and reviewed, no changes at this time.   Continue statin as ordered and reviewed, no changes at this time   Nitrates PRN for chest pain    5. Type 2 diabetes mellitus with stage 3b chronic kidney disease, without long-term current use of insulin (HCC) Continue hypoglycemic medications as already ordered, these medications have been reviewed and there are no changes at this time.   Hgb A1C to be monitored as already arranged by primary service    6. Hypercholesteremia Continue statin as ordered and reviewed, no changes at this time         Hortencia Pilar, MD   07/05/2022 2:32 PM

## 2022-07-24 NOTE — TOC Initial Note (Signed)
Transition of Care [facility]) - Initial/Assessment Note    Patient Details  Name: Taylor [name] MRN: [id number] Date of Birth: Dec 17, 1940  Transition of Care [facility]) CM/SW Contact:    Shelbie Hutching, RN Phone Number: 07/24/2022, 12:34 PM  Clinical Narrative:                  Transition of Care [facility]) Screening Note   Patient Details  Name: Taylor [name] Date of Birth: [birth year]   Transition of Care [facility]) CM/SW Contact:    Shelbie Hutching, RN Phone Number: 07/24/2022, 12:34 PM    Transition of Care Department [facility]) has reviewed patient and no TOC needs have been identified at this time. We will continue to monitor patient advancement through interdisciplinary progression rounds. If new patient transition needs arise, please place a TOC consult.          Patient Goals and CMS Choice        Expected Discharge Plan and Services           Expected Discharge Date: 07/24/22                                    Prior Living Arrangements/Services                       Activities of Daily Living Home Assistive Devices/Equipment: None ADL Screening (condition at time of admission) Patient's cognitive ability adequate to safely complete daily activities?: Yes Is the patient deaf or have difficulty hearing?: No Does the patient have difficulty seeing, even when wearing glasses/contacts?: No Does the patient have difficulty concentrating, remembering, or making decisions?: No Patient able to express need for assistance with ADLs?: Yes Does the patient have difficulty dressing or bathing?: No Independently performs ADLs?: Yes (appropriate for developmental age) Does the patient have difficulty walking or climbing stairs?: No Weakness of Legs: None Weakness of Arms/Hands: None  Permission Sought/Granted                  Emotional Assessment              Admission diagnosis:  Carotid stenosis, symptomatic w/o infarct, left [I65.22] Patient Active  Problem List   Diagnosis Date Noted   Carotid stenosis, symptomatic w/o infarct, left 07/24/2022   Symptomatic carotid artery narrowing without infarction, left 07/05/2022   Dizziness 03/26/2022   Atrophy of kidney 09/27/2021   COVID-19 09/27/2021   Heart failure, unspecified ([place]) 08/22/2021   Other abnormal findings in urine 08/22/2021   Other specified complication of vascular prosthetic devices, implants and grafts, initial encounter ([place]) 08/22/2021   Atherosclerotic heart disease of native coronary artery with unspecified angina pectoris ([place]) 08/22/2021   Implantable cardioverter-defibrillator (ICD) in situ 08/22/2021   Stage 3b chronic kidney disease ([place]) 08/22/2021   Type 2 diabetes mellitus with diabetic chronic kidney disease ([place]) 08/22/2021   Hypertension 08/22/2021   Idiopathic gout, unspecified site 08/22/2021   Pure hypercholesterolemia, unspecified 08/22/2021   Other psoriasis 08/22/2021   Aortic atherosclerosis ([place]) 05/08/2021   Abdominal pain 03/01/2021   Diabetes mellitus without complication ([place]) 89/38/1017   Diarrhea 09/12/2020   Acute kidney injury superimposed on CKD ([place]) 08/10/2020   Hypomagnesemia 04/26/2020   Hypocalcemia 04/19/2020   Numbness and tingling in both hands 04/19/2020   Atrial fibrillation ([place]) 04/19/2020   Other specified interstitial pulmonary diseases ([place]) 12/06/2019   Abdominal pain  Pancreatitis 11/22/2019   CAP (community acquired pneumonia) 11/13/2019   Hyperuricemia 03/31/2019   Numbness 05/07/2018   Benign colonic polyp 03/24/2017   Tibialis anterior tenosynovitis 12/21/2015   Pleural nodule 07/27/2015   Psoriatic arthritis ([place]) 05/12/2015   Restrictive lung disease 05/12/2015   Barrett esophagus 03/30/2015   Ache in joint 03/30/2015   Bradycardia 03/30/2015   CAD (coronary artery disease) 03/30/2015   Cardiac defibrillator in place 03/30/2015   Chronic kidney disease (CKD), stage III (moderate) (HCC) 03/30/2015    Cluster headache syndrome 03/30/2015   Degeneration of lumbar or lumbosacral intervertebral disc 03/30/2015   Gout 03/30/2015   Personal history of malignant neoplasm of bladder 03/30/2015   Hypercholesteremia 03/30/2015   LBP (low back pain) 03/30/2015   Personal history of malignant melanoma of skin 03/30/2015   Muscle ache 03/30/2015   Arthritis of knee, degenerative 03/30/2015   Psoriasis 03/30/2015   Obstructive sleep apnea 03/30/2015   Chronic venous insufficiency 03/30/2015   Low back pain 03/30/2015   History of abdominal aortic aneurysm (AAA) 06/24/2013   History of CVA (cerebrovascular accident) 06/24/2013   Cardiomyopathy, ischemic 06/24/2013   History of AAA (abdominal aortic aneurysm) repair 06/24/2013   Essential (primary) hypertension 08/17/2011   Arrhythmia, sinus node 08/17/2011   History of ventricular fibrillation 03/47/4259   Systolic CHF with reduced left ventricular function, NYHA class 3 ([place]) 02/26/2010   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 04/27/2007   Cardiac pacemaker in situ 10/10/2006   PCP:  Birdie Sons, MD Pharmacy:   [facility] [address] [place], [address] [place] [address] Phone: [phone] Fax: [phone]     Social Determinants of Health (SDOH) Interventions    Readmission Risk Interventions     No data to display

## 2022-07-24 NOTE — Progress Notes (Signed)
Discharge instructions given to patient with verbalized understanding.  Patient denies pain prior to discharge. Right groin stable.  Stable vital signs at discharge, see vital sign flow sheet. Patient taken to visitors entrance via wheelchair to be taken home in personal vehicle by patients wife.

## 2022-07-26 LAB — POCT ACTIVATED CLOTTING TIME: Activated Clotting Time: 293 seconds

## 2022-08-02 ENCOUNTER — Ambulatory Visit: Payer: Medicare Other | Admitting: Family Medicine

## 2022-08-04 NOTE — Discharge Summary (Signed)
[facility]    Discharge Summary    Patient ID:  Taylor [name] MRN: [id number] DOB/AGE: 80-09-1941 80 y.o.  Admit date: 07/23/2022 Discharge date: 08/04/2022 Date of Surgery: 07/23/2022 Surgeon: Surgeon(s): Sheera Illingworth, Dolores Lory, MD  Admission Diagnosis: Carotid stenosis, symptomatic w/o infarct, left [I65.22]  Discharge Diagnoses:  Carotid stenosis, symptomatic w/o infarct, left [I65.22]  Secondary Diagnoses: Past Medical History:  Diagnosis Date   AAA (abdominal aortic aneurysm) ([place]) 06/03/2007   [facility]; Dr. Kellie Simmering   AICD (automatic cardioverter/defibrillator) present    Barrett's esophagus    Bladder cancer [facility])    Bradycardia    CAD (coronary artery disease)    CHF (congestive heart failure) (HCC)    Cluster headache    DDD (degenerative disc disease), lumbar    Dyspnea    WITH EXERTION   Edema    LEFT ANKLE   Fracture of skull base ([place]) 1997   due to fall   GERD (gastroesophageal reflux disease)    Gout    History of bladder cancer 12/1995   Hyperlipidemia    Hypertension    Malignant melanoma ([place]) 12/2012   right dorsal forearm excised   Myocardial infarction [facility])    LAST 2014   Osteoarthritis of knee    Pacemaker 10/10/2006   Pneumonia    2016   Pre-diabetes    Psoriasis    Rib fracture 1997   due to fall   Sleep apnea    CPAP   Stroke ([place])    Venous incompetence     Procedure(s): CAROTID PTA/STENT INTERVENTION  Discharged Condition: good  HPI:  Patient underwent carotid intervention which was successful.  He is fit for DC the following day as is typical  Hospital Course:  Taylor [name] is a 81 y.o. male is S/P Left Procedure(s): CAROTID PTA/STENT INTERVENTION Extubated: POD # 0 Physical exam: Neuro intact; no groin hematoma Post-op wounds clean, dry, intact or healing well Pt. Ambulating, voiding and taking PO diet without difficulty. Pt pain controlled with PO pain meds. Labs as  below Complications:none  Consults:    Significant Diagnostic Studies: CBC Lab Results  Component Value Date   WBC 7.6 03/26/2022   HGB 15.4 03/26/2022   HCT 45.3 03/26/2022   MCV 93 03/26/2022   PLT 187 03/26/2022    BMET    Component Value Date/Time   NA 142 06/25/2022 1512   K 4.3 06/25/2022 1512   CL 102 06/25/2022 1512   CO2 24 06/25/2022 1512   GLUCOSE 73 06/25/2022 1512   GLUCOSE 159 (H) 08/31/2020 1421   BUN 41 (H) 07/23/2022 0937   BUN 34 (H) 06/25/2022 1512   CREATININE 2.08 (H) 07/23/2022 0937   CALCIUM 9.3 06/25/2022 1512   GFRNONAA 32 (L) 07/23/2022 0937   GFRAA 60 11/21/2020 1505   COAG Lab Results  Component Value Date   INR 1.3 (H) 08/09/2020   INR 1.0 11/12/2019     Disposition:  Discharge to :Home Discharge Instructions     Call MD for:  redness, tenderness, or signs of infection (pain, swelling, bleeding, redness, odor or green/yellow discharge around incision site)   Complete by: As directed    Call MD for:  severe or increased pain, loss or decreased feeling  in affected limb(s)   Complete by: As directed    Call MD for:  temperature >100.5   Complete by: As directed    Discharge instructions   Complete by: As directed  Okay to shower after 36 hours.  Please remove the right groin bandage before showering and replace with a Band-Aid daily as needed   Driving Restrictions   Complete by: As directed    No driving for 5 days   Lifting restrictions   Complete by: As directed    No lifting for 1 week   No dressing needed   Complete by: As directed    Replace only if drainage present   Resume previous diet   Complete by: As directed       Allergies as of 07/24/2022       Reactions   Amlodipine Besylate Swelling   Had a reaction when taking with colcrys    Crestor [rosuvastatin]    Muscle cramps and pain   Rocephin [ceftriaxone]    unknown        Medication List     STOP taking these medications     Azelastine-Fluticasone 137-50 MCG/ACT Susp   cetirizine 10 MG tablet Commonly known as: ZYRTEC   ipratropium 0.06 % nasal spray Commonly known as: ATROVENT   sodium chloride 0.65 % Soln nasal spray Commonly known as: OCEAN       TAKE these medications    allopurinol 300 MG tablet Commonly known as: ZYLOPRIM Take 0.5 tablets (150 mg total) by mouth daily. Start taking on: September 25, 2022   apixaban 5 MG Tabs tablet Commonly known as: ELIQUIS Take 1 tablet (5 mg total) by mouth 2 (two) times daily.   aspirin EC 81 MG tablet Take 81 mg by mouth daily.   atorvastatin 40 MG tablet Commonly known as: LIPITOR TAKE 1 TABLET(40 MG) BY MOUTH DAILY   B-12 2500 MCG Tabs Take 2,500 mcg by mouth daily.   Calcium Carbonate Antacid 600 MG chewable tablet Chew 1 tablet by mouth daily.   carvedilol 6.25 MG tablet Commonly known as: COREG Take 1 tablet (6.25 mg total) by mouth 2 (two) times daily with a meal.   GLUCOSAMINE HCL PO Take 1,500 mg by mouth daily.   losartan 50 MG tablet Commonly known as: COZAAR TAKE 1 TABLET(50 MG) BY MOUTH DAILY   magnesium oxide 400 MG tablet Commonly known as: MAG-OX Take 500 mg by mouth 2 (two) times daily.   metFORMIN 500 MG 24 hr tablet Commonly known as: GLUCOPHAGE-XR TAKE 2 TABLETS(1000 MG) BY MOUTH DAILY   OneTouch Verio test strip Generic drug: glucose blood Use as instructed to check sugar daily   pantoprazole 40 MG tablet Commonly known as: PROTONIX TAKE 1 TABLET BY MOUTH EVERY DAY   potassium chloride SA 20 MEQ tablet Commonly known as: KLOR-CON M Take 20 mEq by mouth daily.   torsemide 20 MG tablet Commonly known as: DEMADEX Take 20 mg by mouth daily.   torsemide 20 MG tablet Commonly known as: DEMADEX Take by mouth.   triamcinolone ointment 0.1 % Commonly known as: KENALOG APPLY TOPICALLY TWICE DAILY AS DIRECTED       Verbal and written Discharge instructions given to the patient. Wound care per  Discharge AVS  Follow-up Information     Charmin Aguiniga, Dolores Lory, MD Follow up in 3 week(s).   Specialties: Vascular Surgery, Cardiology, Radiology, Vascular Surgery Why: follow up after procedure with carotid ultrasound Contact information: [place] [address] [phone]                 Signed: Hortencia Pilar, MD  08/04/2022, 2:46 PM

## 2022-08-05 ENCOUNTER — Telehealth: Payer: Self-pay

## 2022-08-05 NOTE — Progress Notes (Signed)
Chronic Care Management Pharmacy Assistant   Name: Taylor [name]  MRN: [id number] DOB: 1941/08/23  Reason for Encounter: Medication Review/General Adherence Call.  Recent office visits:  06/25/2022 [name] PA-C (PCP) No Medication Changes noted,  Ambulatory referral to Neurology, Return in about 4 weeks  05/07/2022 Kirke Shaggy LPN (PCP Office) Medicare Wellness completed, No medication Changes noted.  Recent consult visits:  07/08/2022 Dr. Melrose Nakayama MD (Neurology) No Medication Changes noted 07/08/2022 Dr. Delana Meyer MD (Vascular Surgery) No Medication Changes noted 05/20/2022 Eulogio Ditch NP (Vascular Surgery) No Medication Changes noted.  Hospital visits:  Medication Reconciliation was completed by comparing discharge summary, patient's EMR and Pharmacy list, and upon discussion with patient.  Admitted to the hospital on 07/23/2022 due to Stenosis of left Carotid artery. Discharge date was 07/24/2022. Discharged from [place]?Medications Started at [facility] Discharge:?? -started Allopurinol 300 mg on 09/25/2022  Medication Changes at Hospital Discharge: -Changed None ID  Medications Discontinued at Hospital Discharge: -Stopped Azelastine-Fluticasone  - Stopped cetirizine - Stopped ipratropium  - Stopped sodium chloride   Medications that remain the same after Hospital Discharge:??  -All other medications will remain the same.    Medications: Outpatient Encounter Medications as of 08/05/2022  Medication Sig Note   [START ON 09/25/2022] allopurinol (ZYLOPRIM) 300 MG tablet Take 0.5 tablets (150 mg total) by mouth daily.    apixaban (ELIQUIS) 5 MG TABS tablet Take 1 tablet (5 mg total) by mouth 2 (two) times daily.    aspirin 81 MG EC tablet Take 81 mg by mouth daily.     atorvastatin (LIPITOR) 40 MG tablet TAKE 1 TABLET(40 MG) BY MOUTH DAILY    Calcium Carbonate Antacid 600 MG chewable tablet Chew 1 tablet by mouth daily.    carvedilol  (COREG) 6.25 MG tablet Take 1 tablet (6.25 mg total) by mouth 2 (two) times daily with a meal.    Cyanocobalamin (B-12) 2500 MCG TABS Take 2,500 mcg by mouth daily.    GLUCOSAMINE HCL PO Take 1,500 mg by mouth daily.    glucose blood (ONETOUCH VERIO) test strip Use as instructed to check sugar daily    losartan (COZAAR) 50 MG tablet TAKE 1 TABLET(50 MG) BY MOUTH DAILY    magnesium oxide (MAG-OX) 400 MG tablet Take 500 mg by mouth 2 (two) times daily.    metFORMIN (GLUCOPHAGE-XR) 500 MG 24 hr tablet TAKE 2 TABLETS(1000 MG) BY MOUTH DAILY (Patient not taking: Reported on 07/08/2022)    pantoprazole (PROTONIX) 40 MG tablet TAKE 1 TABLET BY MOUTH EVERY DAY    potassium chloride SA (KLOR-CON) 20 MEQ tablet Take 20 mEq by mouth daily. 01/23/2021: Prescribed by Hassell Done, NP    torsemide (DEMADEX) 20 MG tablet Take 20 mg by mouth daily. (Patient not taking: Reported on 07/08/2022) 01/23/2021: Prescribed by Dr. Nehemiah Massed    torsemide (DEMADEX) 20 MG tablet Take by mouth.    triamcinolone ointment (KENALOG) 0.1 % APPLY TOPICALLY TWICE DAILY AS DIRECTED    No facility-administered encounter medications on file as of 08/05/2022.    Care Gaps: Foot Exam Shingrix Vaccine COVID-19 Vaccine Diabetic Kidney Evaluation Influenza Vaccine     Star Rating Drugs: Atorvastatin 40 mg last filled 05/22/2022 90 day supply at [facility] Metformin 500 mg last filled 05/23/2022 90 day supply at [facility]  Losartan 50 mg last filled 07/08/2022 37 day supply at [facility].   Medication Fill Gaps: None ID  Called patient and discussed medication adherence  with patient, no issues at this time with current medication.   Patient Reports ED visit since his last CPP follow up.   Patient was admitted to [facility] due to Stenosis of the left Carotid artery. Medications Discontinued at Hospital Discharge: Azelastine-Fluticasone, cetirizine,ipratropium, and sodium chloride .  Patient should start Allopurinol 300 mg on 09/25/2022.  Patient reports he is doing good, but still feeling as if he is off balance. Patient states his symptoms are similar to vergito, but is different as if"there is nothing inside my head".Patient states his symptoms started back in March, and really not much of improvement since his stent was placed. Patient reports he thought his hospital follow up was this week,but was actually on 08/02/2022.Patient states he will reach out to PCP to schedule his follow up hopefully this week since he is still having balance issues.  Patient Denies  any side effects with his medication.  Patient Denies  any problems with hiscurrent pharmacy  [place] Pharmacist Assistant [phone]

## 2022-08-06 ENCOUNTER — Encounter: Payer: Self-pay | Admitting: Family Medicine

## 2022-08-06 ENCOUNTER — Ambulatory Visit (INDEPENDENT_AMBULATORY_CARE_PROVIDER_SITE_OTHER): Payer: Medicare Other | Admitting: Family Medicine

## 2022-08-06 VITALS — BP 121/70 | HR 60 | Temp 97.8°F | Resp 16 | Ht 70.0 in | Wt 190.0 lb

## 2022-08-06 DIAGNOSIS — N1832 Chronic kidney disease, stage 3b: Secondary | ICD-10-CM | POA: Diagnosis not present

## 2022-08-06 DIAGNOSIS — E1122 Type 2 diabetes mellitus with diabetic chronic kidney disease: Secondary | ICD-10-CM | POA: Diagnosis not present

## 2022-08-06 DIAGNOSIS — R42 Dizziness and giddiness: Secondary | ICD-10-CM | POA: Diagnosis not present

## 2022-08-06 DIAGNOSIS — Z23 Encounter for immunization: Secondary | ICD-10-CM

## 2022-08-06 MED ORDER — LOSARTAN POTASSIUM 50 MG PO TABS
ORAL_TABLET | ORAL | 4 refills | Status: DC
Start: 2022-08-06 — End: 2022-11-02

## 2022-08-06 NOTE — Patient Instructions (Addendum)
Your dizziness may be related to your changes in blood pressure when you stand.   I recommend that you cut your losartan tablet in half to take 25 mg daily for the next two weeks.   Please schedule a follow up appt in 2 weeks

## 2022-08-06 NOTE — Progress Notes (Signed)
Established patient visit   I,Taylor [name],acting as a scribe for [employer], MD.,have documented all relevant documentation on the behalf of Taylor Foster, MD,as directed by  Taylor Foster, MD while in the presence of Taylor Foster, MD.  Patient: Taylor [name]   DOB: 1941-05-11   81 y.o. Male  MRN: [id number] Visit Date: 08/06/2022  Today's healthcare provider: [name], MD   Chief Complaint  Patient presents with   Follow-up Dizziness   Subjective    HPI  Follow up for dizziness  The patient was last seen for this 4-6 weeks ago. Changes made at last visit include referred to Neurology. Reports that he had his CT Scan, Echo cardiogram and seen Vascular and Neurologist. Feels overall symptoms improved some. He reports that the dizziness feels similar to vertigo  Reports that he fell while going out to the garage  He states he has not any medications for his dizziness  He reports that his wife lives with him at home  Dizziness only occurs when he changes from sitting to standing position  He reports drinking 30oz of water per day and does not notice any change to dizziness He denies hematochezia, hemoptysis, hematuria  Symptoms are improved if he stands slowly    Type 2 DM  He states that he has not been checking his BG as it was in the 170s  Reports that he usually skips lunch  Had roast beef, cole slaw, potatoes, onions and beans for dinner last night and water  He has recently restarted his metformin     -----------------------------------------------------------------------------------------   Medications: Outpatient Medications Prior to Visit  Medication Sig   [START ON 09/25/2022] allopurinol (ZYLOPRIM) 300 MG tablet Take 0.5 tablets (150 mg total) by mouth daily.   apixaban (ELIQUIS) 5 MG TABS tablet Take 1 tablet (5 mg total) by mouth 2 (two) times daily.   aspirin 81 MG EC tablet Take 81 mg  by mouth daily.    atorvastatin (LIPITOR) 40 MG tablet TAKE 1 TABLET(40 MG) BY MOUTH DAILY   Calcium Carbonate Antacid 600 MG chewable tablet Chew 1 tablet by mouth daily.   carvedilol (COREG) 6.25 MG tablet Take 1 tablet (6.25 mg total) by mouth 2 (two) times daily with a meal.   Cyanocobalamin (B-12) 2500 MCG TABS Take 2,500 mcg by mouth daily.   GLUCOSAMINE HCL PO Take 1,500 mg by mouth daily.   glucose blood (ONETOUCH VERIO) test strip Use as instructed to check sugar daily   magnesium oxide (MAG-OX) 400 MG tablet Take 500 mg by mouth 2 (two) times daily.   metFORMIN (GLUCOPHAGE-XR) 500 MG 24 hr tablet TAKE 2 TABLETS(1000 MG) BY MOUTH DAILY   pantoprazole (PROTONIX) 40 MG tablet TAKE 1 TABLET BY MOUTH EVERY DAY   potassium chloride SA (KLOR-CON) 20 MEQ tablet Take 20 mEq by mouth daily.   torsemide (DEMADEX) 20 MG tablet Take 20 mg by mouth daily.   torsemide (DEMADEX) 20 MG tablet Take by mouth.   triamcinolone ointment (KENALOG) 0.1 % APPLY TOPICALLY TWICE DAILY AS DIRECTED   [DISCONTINUED] losartan (COZAAR) 50 MG tablet TAKE 1 TABLET(50 MG) BY MOUTH DAILY   No facility-administered medications prior to visit.    Review of Systems  HENT:  Positive for hearing loss. Negative for ear pain.   Neurological:  Positive for dizziness and light-headedness. Negative for tremors, speech difficulty, numbness and headaches.       Objective    BP 121/70 (BP Location:  Right Arm, Patient Position: Sitting, Cuff Size: Large)   Pulse 60   Temp 97.8 F (36.6 C) (Oral)   Resp 16   Ht '5\' 10"'$  (1.778 m)   Wt 190 lb (86.2 kg)   BMI 27.26 kg/m   Orthostatic VS for the past 24 hrs (Last 3 readings):  BP- Lying Pulse- Lying BP- Sitting Pulse- Sitting BP- Standing at 0 minutes Pulse- Standing at 0 minutes  08/06/22 0946 112/73 61 114/70 60 127/81 64     Physical Exam Cardiovascular:     Rate and Rhythm: Normal rate and regular rhythm.  Pulmonary:     Effort: Pulmonary effort is normal.      Breath sounds: No wheezing, rhonchi or rales.  Neurological:     Mental Status: He is alert and oriented to person, place, and time.     Cranial Nerves: No cranial nerve deficit.     Motor: No weakness.     Coordination: Coordination is intact. Coordination normal.     Gait: Gait normal.      No results found for any visits on 08/06/22.   Assessment & Plan     Problem List Items Addressed This Visit       Endocrine   Type 2 diabetes mellitus with diabetic chronic kidney disease (HCC)    Chronic, stable  Repeat Hgb A1c  Continues metformin '1000mg'$  daily  Will collect urine microalbumin today       Relevant Medications   losartan (COZAAR) 50 MG tablet   Other Relevant Orders   Urine microalbumin-creatinine with uACR   Hemoglobin A1c     Other   Dizziness on standing - Primary    Chronic issue, uncontrolled  Neurological evaluation completed and showed no abnormalities to account for dizziness with changing positions  Patient had left carotid artery stenosis s/p stenting in Aug 2023 He is on losartan '50mg'$  for HTN and torsemide '20mg'$  and coreg 6.'25mg'$  BID, will check for orthostatic vitals to see if this is a potential cause of dizziness with positional change, orthostatic BP changes with standing  Negative dix hallpike maneuver, normal HINTS exam  Will decrease losartan to '25mg'$  daily for two weeks  Will have patient follow up in 2 weeks       Other Visit Diagnoses     Need for influenza vaccination       Relevant Orders   Flu Vaccine QUAD High Dose(Fluad) (Completed)        Return in about 2 weeks (around 08/20/2022) for dizziness .      I, Taylor Foster, MD, have reviewed all documentation for this visit. The documentation on 08/06/22 for the exam, diagnosis, procedures, and orders are all accurate and complete.   Taylor Foster, MD  [facility] [phone] (phone) [phone] (fax)  [place]

## 2022-08-06 NOTE — Assessment & Plan Note (Signed)
Chronic, stable  Repeat Hgb A1c  Continues metformin '1000mg'$  daily  Will collect urine microalbumin today

## 2022-08-06 NOTE — Assessment & Plan Note (Addendum)
Chronic issue, uncontrolled  Neurological evaluation completed and showed no abnormalities to account for dizziness with changing positions  Patient had left carotid artery stenosis s/p stenting in Aug 2023 He is on losartan '50mg'$  for HTN and torsemide '20mg'$  and coreg 6.'25mg'$  BID, will check for orthostatic vitals to see if this is a potential cause of dizziness with positional change, orthostatic BP changes with standing  Negative dix hallpike maneuver, normal HINTS exam  Will decrease losartan to '25mg'$  daily for two weeks  Will have patient follow up in 2 weeks

## 2022-08-07 LAB — HEMOGLOBIN A1C
Est. average glucose Bld gHb Est-mCnc: 140 mg/dL
Hgb A1c MFr Bld: 6.5 % — ABNORMAL HIGH (ref 4.8–5.6)

## 2022-08-07 LAB — MICROALBUMIN / CREATININE URINE RATIO
Creatinine, Urine: 17.8 mg/dL
Microalb/Creat Ratio: <17 mg/g creat (ref 0–29)
Microalbumin, Urine: <3 ug/mL

## 2022-08-14 DIAGNOSIS — I495 Sick sinus syndrome: Secondary | ICD-10-CM | POA: Diagnosis not present

## 2022-08-28 ENCOUNTER — Telehealth: Payer: Self-pay

## 2022-08-28 NOTE — Progress Notes (Signed)
Chronic Care Management Pharmacy Assistant   Name: Taylor [name]  MRN: [id number] DOB: 1941-11-01  Reason for Encounter: Hypertension Disease State Call.  Recent office visits:  08/06/2022 Dr. Alba Cory MD (PCP Office) Decrease losartan to '25mg'$  daily, Return in about 2 weeks    Recent consult visits:  No recent consult visit  Hospital visits:  None in previous 6 months  Medications: Outpatient Encounter Medications as of 08/28/2022  Medication Sig Note   [START ON 09/25/2022] allopurinol (ZYLOPRIM) 300 MG tablet Take 0.5 tablets (150 mg total) by mouth daily.    apixaban (ELIQUIS) 5 MG TABS tablet Take 1 tablet (5 mg total) by mouth 2 (two) times daily.    aspirin 81 MG EC tablet Take 81 mg by mouth daily.     atorvastatin (LIPITOR) 40 MG tablet TAKE 1 TABLET(40 MG) BY MOUTH DAILY    Calcium Carbonate Antacid 600 MG chewable tablet Chew 1 tablet by mouth daily.    carvedilol (COREG) 6.25 MG tablet Take 1 tablet (6.25 mg total) by mouth 2 (two) times daily with a meal.    Cyanocobalamin (B-12) 2500 MCG TABS Take 2,500 mcg by mouth daily.    GLUCOSAMINE HCL PO Take 1,500 mg by mouth daily.    glucose blood (ONETOUCH VERIO) test strip Use as instructed to check sugar daily    losartan (COZAAR) 50 MG tablet TAKE 1/2 TABLET(25 MG) BY MOUTH DAILY    magnesium oxide (MAG-OX) 400 MG tablet Take 500 mg by mouth 2 (two) times daily.    metFORMIN (GLUCOPHAGE-XR) 500 MG 24 hr tablet TAKE 2 TABLETS(1000 MG) BY MOUTH DAILY    pantoprazole (PROTONIX) 40 MG tablet TAKE 1 TABLET BY MOUTH EVERY DAY    potassium chloride SA (KLOR-CON) 20 MEQ tablet Take 20 mEq by mouth daily. 01/23/2021: Prescribed by Hassell Done, NP    torsemide (DEMADEX) 20 MG tablet Take 20 mg by mouth daily. 01/23/2021: Prescribed by Dr. Nehemiah Massed    torsemide (DEMADEX) 20 MG tablet Take by mouth.    triamcinolone ointment (KENALOG) 0.1 % APPLY TOPICALLY TWICE DAILY AS DIRECTED    No facility-administered encounter  medications on file as of 08/28/2022.    Care Gaps: Foot Exam Shingrix Vaccine      Star Rating Drugs: Atorvastatin 40 mg last filled 08/20/2022 90 day supply at [facility] Metformin 500 mg last filled 08/20/2022 90 day supply at [facility]  Losartan 50 mg last filled 08/20/2022 90 day supply at [facility].   Medication Fill Gaps: None ID  Reviewed chart prior to disease state call. Spoke with patient regarding BP  Recent Office Vitals: BP Readings from Last 3 Encounters:  08/06/22 121/70  07/24/22 97/67  07/08/22 136/75   Pulse Readings from Last 3 Encounters:  08/06/22 60  07/24/22 60  07/08/22 69    Wt Readings from Last 3 Encounters:  08/06/22 190 lb (86.2 kg)  07/23/22 181 lb (82.1 kg)  07/08/22 188 lb 3.2 oz (85.4 kg)     Kidney Function Lab Results  Component Value Date/Time   CREATININE 2.08 (H) 07/23/2022 09:37 AM   CREATININE 2.10 (H) 07/03/2022 09:08 AM   GFRNONAA 32 (L) 07/23/2022 09:37 AM   GFRAA 60 11/21/2020 03:05 PM       Latest Ref Rng & Units 07/23/2022    9:37 AM 07/03/2022    9:08 AM 06/25/2022    3:12 PM  BMP  Glucose 70 - 99 mg/dL   73   BUN 8 -  23 mg/dL 41   34   Creatinine 0.61 - 1.24 mg/dL 2.08  2.10  1.99   BUN/Creat Ratio 10 - 24   17   Sodium 134 - 144 mmol/L   142   Potassium 3.5 - 5.2 mmol/L   4.3   Chloride 96 - 106 mmol/L   102   CO2 20 - 29 mmol/L   24   Calcium 8.6 - 10.2 mg/dL   9.3     Current antihypertensive regimen:  Carvedilol 6.25 mg daily  Losartan 25 mg daily  Torsemide 20 mg daily   How often are you checking your Blood Pressure? infrequently Current home BP readings:  Patient states he has not check his blood pressure. Patient reports his dizziness improve since the decrease with his losartan, but still is having his off balance symptoms. Patient states he has a follow up with his cardiologist in the next couple of weeks and will address this with his fatigue symptoms.Patient denies  any chest pains.  What recent interventions/DTPs have been made by any provider to improve Blood Pressure control since last CPP Visit:  08/06/2022 Dr. Alba Cory MD (PCP Office) Decrease losartan to '25mg'$  daily Any recent hospitalizations or ED visits since last visit with CPP? No  What diet changes have been made to improve Blood Pressure Control?  Patient denies any changes.  What exercise is being done to improve your Blood Pressure Control?  Patient denies any changes.  Adherence Review: Is the patient currently on ACE/ARB medication? Yes Does the patient have >5 day gap between last estimated fill dates? No  Anderson [place] Clinical Production designer, theatre/television/film [phone]

## 2022-09-10 DIAGNOSIS — Z95810 Presence of automatic (implantable) cardiac defibrillator: Secondary | ICD-10-CM | POA: Diagnosis not present

## 2022-09-10 DIAGNOSIS — Z8679 Personal history of other diseases of the circulatory system: Secondary | ICD-10-CM | POA: Diagnosis not present

## 2022-09-10 DIAGNOSIS — Z8673 Personal history of transient ischemic attack (TIA), and cerebral infarction without residual deficits: Secondary | ICD-10-CM | POA: Diagnosis not present

## 2022-09-10 DIAGNOSIS — I5022 Chronic systolic (congestive) heart failure: Secondary | ICD-10-CM | POA: Diagnosis not present

## 2022-09-10 DIAGNOSIS — I495 Sick sinus syndrome: Secondary | ICD-10-CM | POA: Diagnosis not present

## 2022-09-10 DIAGNOSIS — I255 Ischemic cardiomyopathy: Secondary | ICD-10-CM | POA: Diagnosis not present

## 2022-09-10 DIAGNOSIS — I480 Paroxysmal atrial fibrillation: Secondary | ICD-10-CM | POA: Diagnosis not present

## 2022-09-10 DIAGNOSIS — I6523 Occlusion and stenosis of bilateral carotid arteries: Secondary | ICD-10-CM | POA: Diagnosis not present

## 2022-09-10 DIAGNOSIS — E782 Mixed hyperlipidemia: Secondary | ICD-10-CM | POA: Diagnosis not present

## 2022-09-10 DIAGNOSIS — I10 Essential (primary) hypertension: Secondary | ICD-10-CM | POA: Diagnosis not present

## 2022-09-10 DIAGNOSIS — I25810 Atherosclerosis of coronary artery bypass graft(s) without angina pectoris: Secondary | ICD-10-CM | POA: Diagnosis not present

## 2022-09-10 DIAGNOSIS — Z9889 Other specified postprocedural states: Secondary | ICD-10-CM | POA: Diagnosis not present

## 2022-09-23 ENCOUNTER — Encounter (INDEPENDENT_AMBULATORY_CARE_PROVIDER_SITE_OTHER): Payer: Self-pay

## 2022-10-09 ENCOUNTER — Ambulatory Visit: Payer: Self-pay

## 2022-10-09 NOTE — Progress Notes (Unsigned)
     I,[name],acting as a Education administrator for [employer], PA-C.,have documented all relevant documentation on the behalf of [name], PA-C,as directed by  [name], PA-C while in the presence of [name], PA-C.   Established patient visit   Patient: Taylor [name]   DOB: [birth year]   81 y.o. Male  MRN: [id number] Visit Date: 10/10/2022  Today's healthcare provider: [name], PA-C   No chief complaint on file.  Subjective    HPI  ***  Medications: Outpatient Medications Prior to Visit  Medication Sig   allopurinol (ZYLOPRIM) 300 MG tablet Take 0.5 tablets (150 mg total) by mouth daily.   apixaban (ELIQUIS) 5 MG TABS tablet Take 1 tablet (5 mg total) by mouth 2 (two) times daily.   aspirin 81 MG EC tablet Take 81 mg by mouth daily.    atorvastatin (LIPITOR) 40 MG tablet TAKE 1 TABLET(40 MG) BY MOUTH DAILY   Calcium Carbonate Antacid 600 MG chewable tablet Chew 1 tablet by mouth daily.   carvedilol (COREG) 6.25 MG tablet Take 1 tablet (6.25 mg total) by mouth 2 (two) times daily with a meal.   Cyanocobalamin (B-12) 2500 MCG TABS Take 2,500 mcg by mouth daily.   GLUCOSAMINE HCL PO Take 1,500 mg by mouth daily.   glucose blood (ONETOUCH VERIO) test strip Use as instructed to check sugar daily   losartan (COZAAR) 50 MG tablet TAKE 1/2 TABLET(25 MG) BY MOUTH DAILY   magnesium oxide (MAG-OX) 400 MG tablet Take 500 mg by mouth 2 (two) times daily.   metFORMIN (GLUCOPHAGE-XR) 500 MG 24 hr tablet TAKE 2 TABLETS(1000 MG) BY MOUTH DAILY   pantoprazole (PROTONIX) 40 MG tablet TAKE 1 TABLET BY MOUTH EVERY DAY   potassium chloride SA (KLOR-CON) 20 MEQ tablet Take 20 mEq by mouth daily.   torsemide (DEMADEX) 20 MG tablet Take 20 mg by mouth daily.   torsemide (DEMADEX) 20 MG tablet Take by mouth.   triamcinolone ointment (KENALOG) 0.1 % APPLY TOPICALLY TWICE DAILY AS DIRECTED   No facility-administered medications prior to visit.    Review of Systems  {Labs   Heme  Chem  Endocrine  Serology  Results Review (optional):23779}   Objective    There were no vitals taken for this visit. {Show previous vital signs (optional):23777}  Physical Exam  ***  No results found for any visits on 10/10/22.  Assessment & Plan     ***  No follow-ups on file.      {provider attestation***:1}   [name], PA-C  [facility] [phone] (phone) [phone] (fax)  [place]

## 2022-10-09 NOTE — Telephone Encounter (Addendum)
Message from Sharene Skeans sent at 10/09/2022  8:15 AM EST  Summary: runny nose and medication   Pt has had a runny nose for 2 weeks with yellow congestion and asked if the provider can send in medication for it / he started also having an ear ache this morning / please advise / pt has been taking musinex and that seems to help a little         Chief Complaint: nasal congestion Symptoms:yellow nasal secretions, earache to left ear Frequency: 2 weeks Pertinent Negatives: Patient denies fever, SOB, cough Disposition: '[]'$ ED /'[]'$ Urgent Care (no appt availability in office) / '[x]'$ Appointment(In office/virtual)/ '[]'$  [place] [facility]/ '[]'$ Home Care/ '[]'$ Refused Recommended Disposition /'[]'$ [name] [facility]/ '[]'$  Follow-up with PCP Additional Notes: n/a  Reason for Disposition  Earache  Answer Assessment - Initial Assessment Questions 1. LOCATION: "Where does it hurt?"      Ears hurt 2. ONSET: "When did the sinus pain start?"  (e.g., hours, days)      none 3. SEVERITY: "How bad is the pain?"   (Scale 1-10; mild, moderate or severe)   - MILD (1-3): doesn't interfere with normal activities    - MODERATE (4-7): interferes with normal activities (e.g., work or school) or awakens from sleep   - SEVERE (8-10): excruciating pain and patient unable to do any normal activities        none 4. RECURRENT SYMPTOM: "Have you ever had sinus problems before?" If Yes, ask: "When was the last time?" and "What happened that time?"      no 5. NASAL CONGESTION: "Is the nose blocked?" If Yes, ask: "Can you open it or must you breathe through your mouth?"     No except at night 6. NASAL DISCHARGE: "Do you have discharge from your nose?" If so ask, "What color?"     Yellow- "a lot" of drainage 7. FEVER: "Do you have a fever?" If Yes, ask: "What is it, how was it measured, and when did it start?"      no 8. OTHER SYMPTOMS: "Do you have any other symptoms?" (e.g., sore throat, cough, earache, difficulty  breathing)     Earache this am left 9. PREGNANCY: "Is there any chance you are pregnant?" "When was your last menstrual period?"     N/a  Protocols used: Sinus Pain or Congestion-A-AH

## 2022-10-10 ENCOUNTER — Encounter: Payer: Self-pay | Admitting: Physician Assistant

## 2022-10-10 ENCOUNTER — Ambulatory Visit (INDEPENDENT_AMBULATORY_CARE_PROVIDER_SITE_OTHER): Payer: Medicare Other | Admitting: Physician Assistant

## 2022-10-10 VITALS — BP 130/79 | HR 65 | Temp 97.9°F | Wt 183.4 lb

## 2022-10-10 DIAGNOSIS — J0110 Acute frontal sinusitis, unspecified: Secondary | ICD-10-CM | POA: Diagnosis not present

## 2022-10-10 MED ORDER — AMOXICILLIN 875 MG PO TABS: 875.0000 mg | ORAL_TABLET | Freq: Two times a day (BID) | ORAL | 0 refills | Status: AC

## 2022-10-15 ENCOUNTER — Ambulatory Visit
Admission: RE | Admit: 2022-10-15 | Discharge: 2022-10-15 | Disposition: A | Discharge status: Home / Self Care | Payer: Medicare Other | Attending: Physician Assistant | Admitting: Physician Assistant

## 2022-10-15 ENCOUNTER — Ambulatory Visit: Payer: Self-pay | Admitting: *Deleted

## 2022-10-15 ENCOUNTER — Ambulatory Visit (INDEPENDENT_AMBULATORY_CARE_PROVIDER_SITE_OTHER): Payer: Medicare Other | Admitting: Physician Assistant

## 2022-10-15 ENCOUNTER — Ambulatory Visit
Admission: RE | Admit: 2022-10-15 | Discharge: 2022-10-15 | Disposition: A | Discharge status: Home / Self Care | Payer: Medicare Other | Source: Ambulatory Visit | Attending: Physician Assistant | Admitting: Physician Assistant

## 2022-10-15 ENCOUNTER — Encounter: Payer: Self-pay | Admitting: Physician Assistant

## 2022-10-15 VITALS — BP 124/83 | HR 64 | Wt 186.1 lb

## 2022-10-15 DIAGNOSIS — R0602 Shortness of breath: Secondary | ICD-10-CM | POA: Diagnosis not present

## 2022-10-15 DIAGNOSIS — I5023 Acute on chronic systolic (congestive) heart failure: Secondary | ICD-10-CM | POA: Insufficient documentation

## 2022-10-15 DIAGNOSIS — R0601 Orthopnea: Secondary | ICD-10-CM | POA: Insufficient documentation

## 2022-10-15 DIAGNOSIS — I5043 Acute on chronic combined systolic (congestive) and diastolic (congestive) heart failure: Secondary | ICD-10-CM | POA: Insufficient documentation

## 2022-10-15 NOTE — Telephone Encounter (Signed)
  Chief Complaint: shortness of breath and wheezing.  Seen last Thur. By [name], PA-C for URI and given medications.   Better except for the shortness of breath Symptoms: runny nose, mild cough with a little mucus, congestion is better than last week.    Frequency: since last week Pertinent Negatives: Patient denies fever Disposition: '[]'$ ED /'[]'$ Urgent Care (no appt availability in office) / '[x]'$ Appointment(In office/virtual)/ '[]'$  [place] [facility]/ '[]'$ Home Care/ '[]'$ Refused Recommended Disposition /'[]'$ [place] [facility]/ '[]'$  Follow-up with PCP Additional Notes: Appt. Made for today with [name], PA-C for 9:20 this morning.

## 2022-10-15 NOTE — Telephone Encounter (Signed)
Reason for Disposition  [1] MILD difficulty breathing (e.g., minimal/no SOB at rest, SOB with walking, pulse <100) AND [2] NEW-onset or WORSE than normal  Answer Assessment - Initial Assessment Questions 1. RESPIRATORY STATUS: "Describe your breathing?" (e.g., wheezing, shortness of breath, unable to speak, severe coughing)      Seen last Thur. By Earney Hamburg.   Last few night I can't breath well at night.   I'm wheezing.   My congestion is better.  I'm getting short of breath with if walking through the house.   When I sleep it's hard to breath on my back.     I use a CPAP machine and I still could not breath.  2. ONSET: "When did this breathing problem begin?"      Night before last. 3. PATTERN "Does the difficult breathing come and go, or has it been constant since it started?"      Worse at night laying on my back.  4. SEVERITY: "How bad is your breathing?" (e.g., mild, moderate, severe)    - MILD: No SOB at rest, mild SOB with walking, speaks normally in sentences, can lie down, no retractions, pulse < 100.    - MODERATE: SOB at rest, SOB with minimal exertion and prefers to sit, cannot lie down flat, speaks in phrases, mild retractions, audible wheezing, pulse 100-120.    - SEVERE: Very SOB at rest, speaks in single words, struggling to breathe, sitting hunched forward, retractions, pulse > 120      Short of breath get up during the night to take a shower.    5. RECURRENT SYMPTOM: "Have you had difficulty breathing before?" If Yes, ask: "When was the last time?" and "What happened that time?"       6. CARDIAC HISTORY: "Do you have any history of heart disease?" (e.g., heart attack, angina, bypass surgery, angioplasty)      I've had a heart attack.    No chest pain.   140/90 my BP.   Normally 120/60s. 7. LUNG HISTORY: "Do you have any history of lung disease?"  (e.g., pulmonary embolus, asthma, emphysema)     Sleep apnea 8. CAUSE: "What do you think is causing the breathing problem?"       I've had this virus.    9. OTHER SYMPTOMS: "Do you have any other symptoms? (e.g., dizziness, runny nose, cough, chest pain, fever)     Runny nose.    No sore throat.   Coughing up a little mucus. 10. O2 SATURATION MONITOR:  "Do you use an oxygen saturation monitor (pulse oximeter) at home?" If Yes, ask: "What is your reading (oxygen level) today?" "What is your usual oxygen saturation reading?" (e.g., 95%)       N/A 11. PREGNANCY: "Is there any chance you are pregnant?" "When was your last menstrual period?"       N/A 12. TRAVEL: "Have you traveled out of the country in the last month?" (e.g., travel history, exposures)       N/A  Protocols used: Breathing Difficulty-A-AH

## 2022-10-15 NOTE — Assessment & Plan Note (Deleted)
Vs pneumonia but chf exacerbation more likely Advised chest xray, bnp, bmp, cbc.  Will advised increase diuretic pending

## 2022-10-15 NOTE — Assessment & Plan Note (Signed)
CHF exacerbation vs pneumonia Advised chest xray, bnp, bmp, cbc.

## 2022-10-15 NOTE — Progress Notes (Signed)
I,Taylor [name],acting as a Education administrator for [employer], PA-C.,have documented all relevant documentation on the behalf of Taylor Kirschner, PA-C,as directed by  Taylor Kirschner, PA-C while in the presence of Taylor Kirschner, PA-C.   Established patient visit   Patient: Taylor [name]   DOB: 09-28-1941   81 y.o. Male  MRN: [id number] Visit Date: 10/15/2022  Today's healthcare provider: [name], PA-C   Cc. Orthopnea x 2 days  Subjective     Pt is a 81 y/o male with hx of CHF who reports over the past 2-3 days having trouble breathing at night or when he lies back. Overall feels better from previous sinus symptoms, but sob is new. Denies leg swelling, palpitations, chest pain. Has two days remaining on abx for sinusitis.   Medications: Outpatient Medications Prior to Visit  Medication Sig   allopurinol (ZYLOPRIM) 300 MG tablet Take 0.5 tablets (150 mg total) by mouth daily.   amoxicillin (AMOXIL) 875 MG tablet Take 1 tablet (875 mg total) by mouth 2 (two) times daily for 7 days.   apixaban (ELIQUIS) 5 MG TABS tablet Take 1 tablet (5 mg total) by mouth 2 (two) times daily.   aspirin 81 MG EC tablet Take 81 mg by mouth daily.    atorvastatin (LIPITOR) 40 MG tablet TAKE 1 TABLET(40 MG) BY MOUTH DAILY   Calcium Carbonate Antacid 600 MG chewable tablet Chew 1 tablet by mouth daily.   carvedilol (COREG) 6.25 MG tablet Take 1 tablet (6.25 mg total) by mouth 2 (two) times daily with a meal.   Cyanocobalamin (B-12) 2500 MCG TABS Take 2,500 mcg by mouth daily.   GLUCOSAMINE HCL PO Take 1,500 mg by mouth daily.   glucose blood (ONETOUCH VERIO) test strip Use as instructed to check sugar daily   losartan (COZAAR) 50 MG tablet TAKE 1/2 TABLET(25 MG) BY MOUTH DAILY   magnesium oxide (MAG-OX) 400 MG tablet Take 500 mg by mouth 2 (two) times daily.   metFORMIN (GLUCOPHAGE-XR) 500 MG 24 hr tablet TAKE 2 TABLETS(1000 MG) BY MOUTH DAILY   pantoprazole (PROTONIX) 40 MG tablet TAKE 1 TABLET BY MOUTH  EVERY DAY   potassium chloride SA (KLOR-CON) 20 MEQ tablet Take 20 mEq by mouth daily.   torsemide (DEMADEX) 20 MG tablet Take 20 mg by mouth daily.   torsemide (DEMADEX) 20 MG tablet Take by mouth.   triamcinolone ointment (KENALOG) 0.1 % APPLY TOPICALLY TWICE DAILY AS DIRECTED   No facility-administered medications prior to visit.    Review of Systems  Respiratory:  Positive for shortness of breath.      Objective    Blood pressure 124/83, pulse 64, weight 186 lb 1.6 oz (84.4 kg), SpO2 100 %.   Physical Exam Constitutional:      General: He is awake.     Appearance: He is well-developed.  HENT:     Head: Normocephalic.  Eyes:     Conjunctiva/sclera: Conjunctivae normal.  Cardiovascular:     Rate and Rhythm: Normal rate and regular rhythm.     Heart sounds: Normal heart sounds.  Pulmonary:     Effort: Pulmonary effort is normal.     Breath sounds: Examination of the right-lower field reveals rhonchi. Rhonchi present.  Skin:    General: Skin is warm.  Neurological:     Mental Status: He is alert and oriented to person, place, and time.  Psychiatric:        Attention and Perception: Attention normal.  Mood and Affect: Mood normal.        Speech: Speech normal.        Behavior: Behavior is cooperative.    No results found for any visits on 10/15/22.  Assessment & Plan     Problem List Items Addressed This Visit       Other   Orthopnea - Primary    CHF exacerbation vs pneumonia Advised chest xray, bnp, bmp, cbc.       Relevant Orders   DG Chest 2 View (Completed)   CBC w/Diff/Platelet   Comprehensive Metabolic Panel (CMET)   B Nat Peptide    Return if symptoms worsen or fail to improve.      I, Taylor Kirschner, PA-C have reviewed all documentation for this visit. The documentation on  10/15/2022  for the exam, diagnosis, procedures, and orders are all accurate and complete.  Taylor Kirschner, PA-C [facility] [address]  [address] [place], [place], [address] Office: [phone] Fax: [place]

## 2022-10-17 LAB — COMPREHENSIVE METABOLIC PANEL
ALT: 18 IU/L (ref 0–44)
AST: 20 IU/L (ref 0–40)
Albumin/Globulin Ratio: 1.4 (ref 1.2–2.2)
Albumin: 4.1 g/dL (ref 3.8–4.8)
Alkaline Phosphatase: 129 IU/L — ABNORMAL HIGH (ref 44–121)
BUN/Creatinine Ratio: 19 (ref 10–24)
BUN: 51 mg/dL — ABNORMAL HIGH (ref 8–27)
Bilirubin Total: 1.3 mg/dL — ABNORMAL HIGH (ref 0.0–1.2)
CO2: 24 mmol/L (ref 20–29)
Calcium: 8.9 mg/dL (ref 8.6–10.2)
Chloride: 105 mmol/L (ref 96–106)
Creatinine, Ser: 2.68 mg/dL — ABNORMAL HIGH (ref 0.76–1.27)
Globulin, Total: 2.9 g/dL (ref 1.5–4.5)
Glucose: 107 mg/dL — ABNORMAL HIGH (ref 70–99)
Potassium: 3.9 mmol/L (ref 3.5–5.2)
Sodium: 143 mmol/L (ref 134–144)
Total Protein: 7 g/dL (ref 6.0–8.5)
eGFR: 23 mL/min/{1.73_m2} — ABNORMAL LOW (ref >59)

## 2022-10-17 LAB — CBC WITH DIFFERENTIAL/PLATELET
Basophils Absolute: 0.1 10*3/uL (ref 0.0–0.2)
Basos: 1 %
EOS (ABSOLUTE): 0.2 10*3/uL (ref 0.0–0.4)
Eos: 3 %
Hematocrit: 39.8 % (ref 37.5–51.0)
Hemoglobin: 13.2 g/dL (ref 13.0–17.7)
Immature Grans (Abs): 0 10*3/uL (ref 0.0–0.1)
Immature Granulocytes: 1 %
Lymphocytes Absolute: 1.5 10*3/uL (ref 0.7–3.1)
Lymphs: 24 %
MCH: 32.2 pg (ref 26.6–33.0)
MCHC: 33.2 g/dL (ref 31.5–35.7)
MCV: 97 fL (ref 79–97)
Monocytes Absolute: 0.5 10*3/uL (ref 0.1–0.9)
Monocytes: 8 %
Neutrophils Absolute: 4.1 10*3/uL (ref 1.4–7.0)
Neutrophils: 63 %
Platelets: 158 10*3/uL (ref 150–450)
RBC: 4.1 x10E6/uL — ABNORMAL LOW (ref 4.14–5.80)
RDW: 14.3 % (ref 11.6–15.4)
WBC: 6.5 10*3/uL (ref 3.4–10.8)

## 2022-10-17 LAB — BRAIN NATRIURETIC PEPTIDE: BNP: 449.2 pg/mL — ABNORMAL HIGH (ref 0.0–100.0)

## 2022-10-21 ENCOUNTER — Encounter: Payer: Self-pay | Admitting: Family Medicine

## 2022-10-21 ENCOUNTER — Ambulatory Visit (INDEPENDENT_AMBULATORY_CARE_PROVIDER_SITE_OTHER): Payer: Medicare Other | Admitting: Family Medicine

## 2022-10-21 VITALS — BP 127/80 | HR 68 | Temp 98.0°F | Resp 24 | Wt 186.0 lb

## 2022-10-21 DIAGNOSIS — J329 Chronic sinusitis, unspecified: Secondary | ICD-10-CM

## 2022-10-21 DIAGNOSIS — I509 Heart failure, unspecified: Secondary | ICD-10-CM | POA: Diagnosis not present

## 2022-10-21 NOTE — Patient Instructions (Signed)
.   Please review the attached list of medications and notify my office if there are any errors.   . Please bring all of your medications to every appointment so we can make sure that our medication list is the same as yours.   

## 2022-10-21 NOTE — Progress Notes (Signed)
I,Roshena L Chambers,acting as a scribe for Lelon Huh, MD.,have documented all relevant documentation on the behalf of Lelon Huh, MD,as directed by  Lelon Huh, MD while in the presence of Lelon Huh, MD.    Established patient visit   Patient: Taylor [name]   DOB: 1941/05/11   81 y.o. Male  MRN: [id number] Visit Date: 10/21/2022  Today's healthcare provider: Lelon Huh, MD   Chief Complaint  Patient presents with   Follow-up   Subjective    HPI  Follow up for Orthopnea  The patient was last seen for this 6 days ago. Changes made at last visit include Advised chest xray, bnp, bmp, cbc. After chest xray resulted advised to take an additional 20 mg torsemide which he took for a few days, increasing from his usual 2 x 64m a day to 3 x 27ma day.  BNP was up to 449.2 Lab Results  Component Value Date   NA 143 10/15/2022   K 3.9 10/15/2022   CREATININE 2.68 (H) 10/15/2022   EGFR 23 (L) 10/15/2022   GFRNONAA 32 (L) 07/23/2022   GLUCOSE 107 (H) 10/15/2022  Last CBC Lab Results  Component Value Date   WBC 6.5 10/15/2022   HGB 13.2 10/15/2022   HCT 39.8 10/15/2022   MCV 97 10/15/2022   MCH 32.2 10/15/2022   RDW 14.3 10/15/2022   PLT 158 10/15/2022  Chest XR revealed stable cardiac enlargement.   Wt Readings from Last 3 Encounters:  10/21/22 186 lb (84.4 kg)  10/15/22 186 lb 1.6 oz (84.4 kg)  10/10/22 183 lb 6.4 oz (83.2 kg)   He reports good compliance with treatment. He feels that condition is Improved. Is no longer feeling short of breath  He has been treated for sinusitis on 10/10/22 with 7 days of amoxicillin. He states this is also greatly improved and just has a bit of clear drainage now.   -----------------------------------------------------------------------------------------   Medications: Outpatient Medications Prior to Visit  Medication Sig   allopurinol (ZYLOPRIM) 300 MG tablet Take 0.5 tablets (150 mg total) by mouth daily.    apixaban (ELIQUIS) 5 MG TABS tablet Take 1 tablet (5 mg total) by mouth 2 (two) times daily.   aspirin 81 MG EC tablet Take 81 mg by mouth daily.    atorvastatin (LIPITOR) 40 MG tablet TAKE 1 TABLET(40 MG) BY MOUTH DAILY   Calcium Carbonate Antacid 600 MG chewable tablet Chew 1 tablet by mouth daily.   carvedilol (COREG) 6.25 MG tablet Take 1 tablet (6.25 mg total) by mouth 2 (two) times daily with a meal.   Cyanocobalamin (B-12) 2500 MCG TABS Take 2,500 mcg by mouth daily.   GLUCOSAMINE HCL PO Take 1,500 mg by mouth daily.   glucose blood (ONETOUCH VERIO) test strip Use as instructed to check sugar daily   losartan (COZAAR) 50 MG tablet TAKE 1/2 TABLET(25 MG) BY MOUTH DAILY   magnesium oxide (MAG-OX) 400 MG tablet Take 500 mg by mouth 2 (two) times daily.   metFORMIN (GLUCOPHAGE-XR) 500 MG 24 hr tablet TAKE 2 TABLETS(1000 MG) BY MOUTH DAILY   pantoprazole (PROTONIX) 40 MG tablet TAKE 1 TABLET BY MOUTH EVERY DAY   potassium chloride SA (KLOR-CON) 20 MEQ tablet Take 20 mEq by mouth daily.   torsemide (DEMADEX) 20 MG tablet Take 40 mg by mouth daily.   triamcinolone ointment (KENALOG) 0.1 % APPLY TOPICALLY TWICE DAILY AS DIRECTED   No facility-administered medications prior to visit.    Review of  Systems  Constitutional:  Negative for appetite change, chills and fever.  HENT:  Positive for rhinorrhea.   Respiratory:  Positive for shortness of breath. Negative for chest tightness and wheezing.   Cardiovascular:  Negative for chest pain and palpitations.  Gastrointestinal:  Negative for abdominal pain, nausea and vomiting.       Objective    BP 127/80 (BP Location: Left Arm, Patient Position: Sitting, Cuff Size: Large)   Pulse 68   Temp 98 F (36.7 C) (Oral)   Resp (!) 24   Wt 186 lb (84.4 kg)   SpO2 100% Comment: room air  BMI 26.69 kg/m    Physical Exam   General: Appearance:    Well developed, well nourished male in no acute distress  Eyes:    PERRL, conjunctiva/corneas  clear, EOM's intact       Lungs:     Clear to auscultation bilaterally, respirations unlabored  Heart:    Normal heart rate. Regular rhythm. No murmurs, rubs, or gallops.    MS:   All extremities are intact.    Neurologic:   Awake, alert, oriented x 3. No apparent focal neurological defect.         Assessment & Plan     1. Acute on chronic congestive heart failure, unspecified heart failure type (New ) Symptomatically resolved after a few days of increased dose of furosemide. Likely triggered by URI. Is now back to baseline.   Follow up cardiology as scheduled. Call if any flare up of symptoms.   2. Sinusitis, unspecified chronicity, unspecified location Resolved with 7 days amoxicillin.        The entirety of the information documented in the History of Present Illness, Review of Systems and Physical Exam were personally obtained by me. Portions of this information were initially documented by the CMA and reviewed by me for thoroughness and accuracy.     Lelon Huh, MD  [facility] [phone] (phone) [phone] (fax)  [place]

## 2022-10-27 ENCOUNTER — Emergency Department: Payer: Medicare Other

## 2022-10-27 ENCOUNTER — Other Ambulatory Visit: Payer: Self-pay

## 2022-10-27 ENCOUNTER — Emergency Department
Admission: EM | Admit: 2022-10-27 | Discharge: 2022-10-27 | Disposition: A | Discharge status: Home / Self Care | Payer: Medicare Other | Source: Home / Self Care | Attending: Student in an Organized Health Care Education/Training Program | Admitting: Student in an Organized Health Care Education/Training Program

## 2022-10-27 DIAGNOSIS — I10 Essential (primary) hypertension: Secondary | ICD-10-CM | POA: Insufficient documentation

## 2022-10-27 DIAGNOSIS — R1011 Right upper quadrant pain: Secondary | ICD-10-CM | POA: Diagnosis not present

## 2022-10-27 DIAGNOSIS — J069 Acute upper respiratory infection, unspecified: Secondary | ICD-10-CM | POA: Diagnosis not present

## 2022-10-27 DIAGNOSIS — R0902 Hypoxemia: Secondary | ICD-10-CM | POA: Diagnosis not present

## 2022-10-27 DIAGNOSIS — R110 Nausea: Secondary | ICD-10-CM | POA: Insufficient documentation

## 2022-10-27 DIAGNOSIS — R0602 Shortness of breath: Secondary | ICD-10-CM | POA: Insufficient documentation

## 2022-10-27 DIAGNOSIS — Z7901 Long term (current) use of anticoagulants: Secondary | ICD-10-CM | POA: Insufficient documentation

## 2022-10-27 DIAGNOSIS — R1084 Generalized abdominal pain: Secondary | ICD-10-CM | POA: Insufficient documentation

## 2022-10-27 DIAGNOSIS — J9621 Acute and chronic respiratory failure with hypoxia: Secondary | ICD-10-CM | POA: Diagnosis not present

## 2022-10-27 DIAGNOSIS — R0600 Dyspnea, unspecified: Secondary | ICD-10-CM | POA: Diagnosis not present

## 2022-10-27 DIAGNOSIS — J329 Chronic sinusitis, unspecified: Secondary | ICD-10-CM | POA: Insufficient documentation

## 2022-10-27 DIAGNOSIS — N179 Acute kidney failure, unspecified: Secondary | ICD-10-CM | POA: Diagnosis not present

## 2022-10-27 DIAGNOSIS — I4891 Unspecified atrial fibrillation: Secondary | ICD-10-CM | POA: Insufficient documentation

## 2022-10-27 DIAGNOSIS — R109 Unspecified abdominal pain: Secondary | ICD-10-CM | POA: Diagnosis not present

## 2022-10-27 DIAGNOSIS — N1832 Chronic kidney disease, stage 3b: Secondary | ICD-10-CM | POA: Diagnosis not present

## 2022-10-27 DIAGNOSIS — Z8679 Personal history of other diseases of the circulatory system: Secondary | ICD-10-CM | POA: Diagnosis not present

## 2022-10-27 DIAGNOSIS — I255 Ischemic cardiomyopathy: Secondary | ICD-10-CM | POA: Diagnosis not present

## 2022-10-27 DIAGNOSIS — Z85820 Personal history of malignant melanoma of skin: Secondary | ICD-10-CM | POA: Diagnosis not present

## 2022-10-27 DIAGNOSIS — Z8551 Personal history of malignant neoplasm of bladder: Secondary | ICD-10-CM | POA: Diagnosis not present

## 2022-10-27 DIAGNOSIS — Z951 Presence of aortocoronary bypass graft: Secondary | ICD-10-CM | POA: Diagnosis not present

## 2022-10-27 DIAGNOSIS — Z8673 Personal history of transient ischemic attack (TIA), and cerebral infarction without residual deficits: Secondary | ICD-10-CM | POA: Diagnosis not present

## 2022-10-27 DIAGNOSIS — I509 Heart failure, unspecified: Secondary | ICD-10-CM | POA: Diagnosis not present

## 2022-10-27 DIAGNOSIS — E785 Hyperlipidemia, unspecified: Secondary | ICD-10-CM | POA: Diagnosis not present

## 2022-10-27 DIAGNOSIS — Z1152 Encounter for screening for COVID-19: Secondary | ICD-10-CM | POA: Insufficient documentation

## 2022-10-27 DIAGNOSIS — K219 Gastro-esophageal reflux disease without esophagitis: Secondary | ICD-10-CM | POA: Diagnosis not present

## 2022-10-27 DIAGNOSIS — I130 Hypertensive heart and chronic kidney disease with heart failure and stage 1 through stage 4 chronic kidney disease, or unspecified chronic kidney disease: Secondary | ICD-10-CM | POA: Diagnosis not present

## 2022-10-27 DIAGNOSIS — I700 Atherosclerosis of aorta: Secondary | ICD-10-CM | POA: Diagnosis not present

## 2022-10-27 DIAGNOSIS — I5023 Acute on chronic systolic (congestive) heart failure: Secondary | ICD-10-CM | POA: Diagnosis not present

## 2022-10-27 DIAGNOSIS — E1122 Type 2 diabetes mellitus with diabetic chronic kidney disease: Secondary | ICD-10-CM | POA: Diagnosis not present

## 2022-10-27 DIAGNOSIS — I081 Rheumatic disorders of both mitral and tricuspid valves: Secondary | ICD-10-CM | POA: Diagnosis not present

## 2022-10-27 DIAGNOSIS — I63532 Cerebral infarction due to unspecified occlusion or stenosis of left posterior cerebral artery: Secondary | ICD-10-CM | POA: Diagnosis not present

## 2022-10-27 DIAGNOSIS — M109 Gout, unspecified: Secondary | ICD-10-CM | POA: Diagnosis not present

## 2022-10-27 DIAGNOSIS — Z87891 Personal history of nicotine dependence: Secondary | ICD-10-CM | POA: Diagnosis not present

## 2022-10-27 DIAGNOSIS — Z95810 Presence of automatic (implantable) cardiac defibrillator: Secondary | ICD-10-CM | POA: Diagnosis not present

## 2022-10-27 DIAGNOSIS — L409 Psoriasis, unspecified: Secondary | ICD-10-CM | POA: Diagnosis not present

## 2022-10-27 DIAGNOSIS — I252 Old myocardial infarction: Secondary | ICD-10-CM | POA: Diagnosis not present

## 2022-10-27 DIAGNOSIS — I2510 Atherosclerotic heart disease of native coronary artery without angina pectoris: Secondary | ICD-10-CM | POA: Diagnosis not present

## 2022-10-27 LAB — URINALYSIS, ROUTINE W REFLEX MICROSCOPIC
Bacteria, UA: NONE SEEN
Bilirubin Urine: NEGATIVE
Glucose, UA: NEGATIVE mg/dL
Hgb urine dipstick: NEGATIVE
Ketones, ur: NEGATIVE mg/dL
Leukocytes,Ua: NEGATIVE
Nitrite: NEGATIVE
Protein, ur: 30 mg/dL — AB
Specific Gravity, Urine: 1.011 (ref 1.005–1.030)
Squamous Epithelial / HPF: NONE SEEN (ref 0–5)
pH: 5 (ref 5.0–8.0)

## 2022-10-27 LAB — CBC
HCT: 38.6 % — ABNORMAL LOW (ref 39.0–52.0)
Hemoglobin: 12.4 g/dL — ABNORMAL LOW (ref 13.0–17.0)
MCH: 32.2 pg (ref 26.0–34.0)
MCHC: 32.1 g/dL (ref 30.0–36.0)
MCV: 100.3 fL — ABNORMAL HIGH (ref 80.0–100.0)
Platelets: 137 10*3/uL — ABNORMAL LOW (ref 150–400)
RBC: 3.85 MIL/uL — ABNORMAL LOW (ref 4.22–5.81)
RDW: 16.1 % — ABNORMAL HIGH (ref 11.5–15.5)
WBC: 6.2 10*3/uL (ref 4.0–10.5)
nRBC: 0 % (ref 0.0–0.2)

## 2022-10-27 LAB — BASIC METABOLIC PANEL
Anion gap: 11 (ref 5–15)
BUN: 57 mg/dL — ABNORMAL HIGH (ref 8–23)
CO2: 23 mmol/L (ref 22–32)
Calcium: 9 mg/dL (ref 8.9–10.3)
Chloride: 105 mmol/L (ref 98–111)
Creatinine, Ser: 2.93 mg/dL — ABNORMAL HIGH (ref 0.61–1.24)
GFR, Estimated: 21 mL/min — ABNORMAL LOW (ref >60)
Glucose, Bld: 173 mg/dL — ABNORMAL HIGH (ref 70–99)
Potassium: 3.6 mmol/L (ref 3.5–5.1)
Sodium: 139 mmol/L (ref 135–145)

## 2022-10-27 LAB — HEPATIC FUNCTION PANEL
ALT: 17 U/L (ref 0–44)
AST: 24 U/L (ref 15–41)
Albumin: 3.5 g/dL (ref 3.5–5.0)
Alkaline Phosphatase: 131 U/L — ABNORMAL HIGH (ref 38–126)
Bilirubin, Direct: 0.4 mg/dL — ABNORMAL HIGH (ref 0.0–0.2)
Indirect Bilirubin: 1.3 mg/dL — ABNORMAL HIGH (ref 0.3–0.9)
Total Bilirubin: 1.7 mg/dL — ABNORMAL HIGH (ref 0.3–1.2)
Total Protein: 7.2 g/dL (ref 6.5–8.1)

## 2022-10-27 LAB — LIPASE, BLOOD: Lipase: 36 U/L (ref 11–51)

## 2022-10-27 LAB — RESP PANEL BY RT-PCR (FLU A&B, COVID) ARPGX2
Influenza A by PCR: NEGATIVE
Influenza B by PCR: NEGATIVE
SARS Coronavirus 2 by RT PCR: NEGATIVE

## 2022-10-27 LAB — TROPONIN I (HIGH SENSITIVITY)
Troponin I (High Sensitivity): 33 ng/L — ABNORMAL HIGH (ref <18)
Troponin I (High Sensitivity): 33 ng/L — ABNORMAL HIGH (ref <18)

## 2022-10-27 MED ORDER — ALUM & MAG HYDROXIDE-SIMETH 200-200-20 MG/5ML PO SUSP
30.0000 mL | Freq: Once | ORAL | Status: AC
  Administered 2022-10-27: 30 mL via ORAL
  Filled 2022-10-27: qty 30

## 2022-10-27 MED ORDER — IPRATROPIUM-ALBUTEROL 0.5-2.5 (3) MG/3ML IN SOLN
3.0000 mL | Freq: Once | RESPIRATORY_TRACT | Status: AC
  Administered 2022-10-27: 3 mL via RESPIRATORY_TRACT
  Filled 2022-10-27: qty 3

## 2022-10-27 MED ORDER — DOXYCYCLINE HYCLATE 100 MG PO TABS: 100.0000 mg | ORAL_TABLET | Freq: Two times a day (BID) | ORAL | 0 refills | Status: DC

## 2022-10-27 NOTE — ED Provider Notes (Signed)
[facility] Provider Note    Event Date/Time   First MD Initiated Contact with Patient 10/27/22 [phone]     (approximate)   History   Dizziness, Nausea, and Hypertension   HPI  Taylor [name] is a 81 y.o. male extensive past medical history including significant cardiac disease, A-fib on anticoagulation presents to the ER for evaluation nausea epigastric discomfort some shortness of breath woke patient up from sleep earlier this morning.  States blood pressure was elevated he took an extra blood pressure medication.  He denies any chest pain or pressure or shortness of breath at this time.  No measured fevers or chills.  Denies any dysuria.  No flank pain.  He is having epigastric pain.  Does not want a thing for pain right now.     Physical Exam   Triage Vital Signs: ED Triage Vitals  Enc Vitals Group     BP 10/27/22 0655 (!) 139/99     Pulse Rate 10/27/22 0656 62     Resp 10/27/22 0655 18     Temp 10/27/22 0656 97.6 F (36.4 C)     Temp Source 10/27/22 0656 Oral     SpO2 10/27/22 0656 96 %     Weight 10/27/22 0657 178 lb (80.7 kg)     Height 10/27/22 0657 '5\' 10"'$  (1.778 m)     Head Circumference --      Peak Flow --      Pain Score 10/27/22 0656 0     Pain Loc --      Pain Edu? --      Excl. in [place]? --     Most recent vital signs: Vitals:   10/27/22 1100 10/27/22 1113  BP: 114/80 114/80  Pulse: 62 66  Resp:  (!) 98  Temp:  97.9 F (36.6 C)  SpO2: 98% 91%     Constitutional: Alert  Eyes: Conjunctivae are normal.  Head: Atraumatic. Nose: No congestion/rhinnorhea. Mouth/Throat: Mucous membranes are moist.   Neck: Painless ROM.  Cardiovascular:   Good peripheral circulation. Respiratory: Normal respiratory effort.  No retractions.  Gastrointestinal: Soft and nontender.  Musculoskeletal:  no deformity Neurologic:  MAE spontaneously. No gross focal neurologic deficits are appreciated.  Skin:  Skin is warm, dry and intact. No rash  noted. Psychiatric: Mood and affect are normal. Speech and behavior are normal.    ED Results / Procedures / Treatments   Labs (all labs ordered are listed, but only abnormal results are displayed) Labs Reviewed  BASIC METABOLIC PANEL - Abnormal; Notable for the following components:      Result Value   Glucose, Bld 173 (*)    BUN 57 (*)    Creatinine, Ser 2.93 (*)    GFR, Estimated 21 (*)    All other components within normal limits  CBC - Abnormal; Notable for the following components:   RBC 3.85 (*)    Hemoglobin 12.4 (*)    HCT 38.6 (*)    MCV 100.3 (*)    RDW 16.1 (*)    Platelets 137 (*)    All other components within normal limits  HEPATIC FUNCTION PANEL - Abnormal; Notable for the following components:   Alkaline Phosphatase 131 (*)    Total Bilirubin 1.7 (*)    Bilirubin, Direct 0.4 (*)    Indirect Bilirubin 1.3 (*)    All other components within normal limits  URINALYSIS, ROUTINE W REFLEX MICROSCOPIC - Abnormal; Notable for the following components:  Color, Urine YELLOW (*)    APPearance HAZY (*)    Protein, ur 30 (*)    All other components within normal limits  TROPONIN I (HIGH SENSITIVITY) - Abnormal; Notable for the following components:   Troponin I (High Sensitivity) 33 (*)    All other components within normal limits  TROPONIN I (HIGH SENSITIVITY) - Abnormal; Notable for the following components:   Troponin I (High Sensitivity) 33 (*)    All other components within normal limits  RESP PANEL BY RT-PCR (FLU A&B, COVID) ARPGX2  LIPASE, BLOOD     EKG  ED ECG REPORT I, Merlyn Lot, the attending physician, personally viewed and interpreted this ECG.   Date: 10/27/2022  EKG Time: 6:59  Rate: 60  Rhythm: v paced  Axis: left  Intervals: borderline prolonged qt  ST&T Change: no stemi    RADIOLOGY Please see ED Course for my review and interpretation.  I personally reviewed all radiographic images ordered to evaluate for the above acute  complaints and reviewed radiology reports and findings.  These findings were personally discussed with the patient.  Please see medical record for radiology report.    PROCEDURES:  Critical Care performed: No  Procedures   MEDICATIONS ORDERED IN ED: Medications  alum & mag hydroxide-simeth (MAALOX/MYLANTA) 200-200-20 MG/5ML suspension 30 mL (30 mLs Oral Given 10/27/22 0901)  ipratropium-albuterol (DUONEB) 0.5-2.5 (3) MG/3ML nebulizer solution 3 mL (3 mLs Nebulization Given 10/27/22 1111)     IMPRESSION / MDM / ASSESSMENT AND PLAN / ED COURSE  I reviewed the triage vital signs and the nursing notes.                              Differential diagnosis includes, but is not limited to, dehydration, electrolyte abnormality, pneumonia, sepsis, colitis, diverticulitis, abscess, biliary pathology, pancreatitis, ACS, CVA  Patient presenting to the ER for evaluation of symptoms as described above.  Based on symptoms, risk factors and considered above differential, this presenting complaint could reflect a potentially life-threatening illness therefore the patient will be placed on continuous pulse oximetry and telemetry for monitoring.  Laboratory evaluation will be sent to evaluate for the above complaints. Patient appears clinically well.  No resp distress, wheeze or crackles.  No neuro deficits. Imaging will be ordered for the above differential.    Clinical Course as of 10/27/22 1152  Sun Oct 27, 2022  0807 Chest x-ray on my review and interpretation without evidence of pneumothorax or consolidation. [PR]  [phone] Single my review interpretation shows findings concerning for calcific pancreatitis no significant edema we will add on lipase as well as LFTs.  No sign of perforation. [PR]  1044 Urinalysis without any evidence of infection. [PR]  1049 Patient reassessed remains nontoxic-appearing hemodynamically stable.  Troponins flat not consistent with ACS.  He is on Eliquis have a low suspicion  for PE.  Doubt infectious process his white count is normal.  Renal function at baseline.  Patient denying any dizziness numbness or tingling.  Now stating that he feels short of breath when lying flat.  Chest x-ray does not show any significant edema.  He does not have any lower extremity edema.  States he did try his brothers nebulizer with some improvement over the past week.  Will trial nebulizer here. [PR]    Clinical Course User Index [PR] Merlyn Lot, MD       FINAL CLINICAL IMPRESSION(S) / ED DIAGNOSES   Final diagnoses:  Sinusitis, unspecified chronicity, unspecified location  Dyspnea, unspecified type  Generalized abdominal pain     Rx / DC Orders   ED Discharge Orders          Ordered    doxycycline (VIBRA-TABS) 100 MG tablet  2 times daily        10/27/22 1149             Note:  This document was prepared using Dragon voice recognition software and may include unintentional dictation errors.    Merlyn Lot, MD 10/27/22 1152

## 2022-10-27 NOTE — ED Triage Notes (Signed)
Pt reports woke up 0230 with nausea and dizziness. Reports checked bp at home and found it to be 178/102. Pt denies chest pain or pressure. Denies h/a, vision change, parasthesia. Pt alert and oriented following commands appropriately. Breathing unlabored speaking in full sentences.

## 2022-10-28 ENCOUNTER — Ambulatory Visit (INDEPENDENT_AMBULATORY_CARE_PROVIDER_SITE_OTHER): Payer: Medicare Other | Admitting: Family Medicine

## 2022-10-28 ENCOUNTER — Encounter: Payer: Self-pay | Admitting: Family Medicine

## 2022-10-28 ENCOUNTER — Telehealth: Payer: Medicare Other

## 2022-10-28 VITALS — BP 107/75 | HR 65 | Temp 98.6°F | Resp 18 | Wt 188.0 lb

## 2022-10-28 DIAGNOSIS — R0600 Dyspnea, unspecified: Secondary | ICD-10-CM

## 2022-10-28 DIAGNOSIS — J069 Acute upper respiratory infection, unspecified: Secondary | ICD-10-CM | POA: Diagnosis not present

## 2022-10-28 NOTE — Progress Notes (Signed)
I,April Miller,acting as a scribe for Lelon Huh, MD.,have documented all relevant documentation on the behalf of Lelon Huh, MD,as directed by  Lelon Huh, MD while in the presence of Lelon Huh, MD.   Established patient visit   Patient: Taylor [name]   DOB: 1941/05/02   81 y.o. Male  MRN: [id number] Visit Date: 10/28/2022  Today's healthcare provider: Lelon Huh, MD    Subjective    HPI  Follow up ER visit  Patient was seen in ER for Sinusitis, Dyspnea, Generalized abdominal pain on 10/27/2022. He was treated for; Sinusitis, Dyspnea, Generalized abdominal pain . Chest XR was c/w mild CHF Had normal abdominal ultrasound, no acute changes on abdominal CT and head CT Treatment for this includes prescription for doxycycline.  He reports good compliance with treatment. He reports this condition is Improved. Feeling hoarse with mild sore throat onset today.   Lab Results  Component Value Date   NA 139 10/27/2022   K 3.6 10/27/2022   CREATININE 2.93 (H) 10/27/2022   EGFR 23 (L) 10/15/2022   GFRNONAA 21 (L) 10/27/2022   GLUCOSE 173 (H) 10/27/2022   Lab Results  Component Value Date   WBC 6.2 10/27/2022   HGB 12.4 (L) 10/27/2022   HCT 38.6 (L) 10/27/2022   MCV 100.3 (H) 10/27/2022   PLT 137 (L) 10/27/2022   Of note is that he was seen at this office on 10/15/22 for DOE/orthopnea with elevated BNP up to 449. Torsemide was increased from 2 x 54m daily to 3 x 274mdaily for several days during which time dyspnea resolved. He states he is now back to taking 3 x 2068maily.   -----------------------------------------------------------------------------------------    Medications: Outpatient Medications Prior to Visit  Medication Sig   allopurinol (ZYLOPRIM) 300 MG tablet Take 0.5 tablets (150 mg total) by mouth daily.   apixaban (ELIQUIS) 5 MG TABS tablet Take 1 tablet (5 mg total) by mouth 2 (two) times daily.   aspirin 81 MG EC tablet Take 81 mg by  mouth daily.    atorvastatin (LIPITOR) 40 MG tablet TAKE 1 TABLET(40 MG) BY MOUTH DAILY   Calcium Carbonate Antacid 600 MG chewable tablet Chew 1 tablet by mouth daily.   carvedilol (COREG) 6.25 MG tablet Take 1 tablet (6.25 mg total) by mouth 2 (two) times daily with a meal.   Cyanocobalamin (B-12) 2500 MCG TABS Take 2,500 mcg by mouth daily.   doxycycline (VIBRA-TABS) 100 MG tablet Take 1 tablet (100 mg total) by mouth 2 (two) times daily for 7 days.   GLUCOSAMINE HCL PO Take 1,500 mg by mouth daily.   glucose blood (ONETOUCH VERIO) test strip Use as instructed to check sugar daily   losartan (COZAAR) 50 MG tablet TAKE 1/2 TABLET(25 MG) BY MOUTH DAILY   magnesium oxide (MAG-OX) 400 MG tablet Take 500 mg by mouth 2 (two) times daily.   metFORMIN (GLUCOPHAGE-XR) 500 MG 24 hr tablet TAKE 2 TABLETS(1000 MG) BY MOUTH DAILY   pantoprazole (PROTONIX) 40 MG tablet TAKE 1 TABLET BY MOUTH EVERY DAY   potassium chloride SA (KLOR-CON) 20 MEQ tablet Take 20 mEq by mouth daily.   torsemide (DEMADEX) 20 MG tablet Take 40 mg by mouth daily.   triamcinolone ointment (KENALOG) 0.1 % APPLY TOPICALLY TWICE DAILY AS DIRECTED   No facility-administered medications prior to visit.    Review of Systems  Constitutional:  Negative for appetite change, chills and fever.  Respiratory:  Negative for chest tightness, shortness  of breath and wheezing.   Cardiovascular:  Negative for chest pain and palpitations.  Gastrointestinal:  Negative for abdominal pain, nausea and vomiting.       Objective    BP 107/75 (BP Location: Left Arm, Patient Position: Sitting, Cuff Size: Large)   Pulse 65   Temp 98.6 F (37 C) (Oral)   Resp 18   Wt 188 lb (85.3 kg)   SpO2 94%   BMI 26.98 kg/m    Physical Exam  General Appearance:    Well developed, well nourished male, alert, cooperative, in no acute distress  HENT:   bilateral TM normal without fluid or infection, neck without nodes, pharynx erythematous without exudate,  sinuses nontender, post nasal drip noted, and nasal mucosa congested  Eyes:    PERRL, conjunctiva/corneas clear, EOM's intact       Lungs:     Clear to auscultation bilaterally, respirations unlabored  Heart:    Normal heart rate. Regular rhythm. No murmurs, rubs, or gallops.    Ext:   1+ bilateral feet and ankle edema, slightly more prominent on left.      Assessment & Plan     1. Viral upper respiratory tract infection Was prescribed doxycycline for sinusitis from ER yesterday, but today with onset of sx more suggestive of viral URI, which may contribute to mild cardiac decompensation.   2. Dyspnea, unspecified type Is now back up to 3 x 57m torsemide daily.  - B Nat Peptide      The entirety of the information documented in the History of Present Illness, Review of Systems and Physical Exam were personally obtained by me. Portions of this information were initially documented by the CMA and reviewed by me for thoroughness and accuracy.     DLelon Huh MD  [phone](phone) [phone](fax)  [place]

## 2022-10-28 NOTE — Progress Notes (Deleted)
Chronic Care Management Pharmacy Note  10/28/2022 Name:  Taylor [name] MRN:  [id number] DOB:  June 08, 1941  Summary: Patient presents for CCM follow-up.   Recommendations/Changes made from today's visit: -Continue current medications  Plan: CPP follow-up in 4 months.   Subjective: Taylor [name] is an 81 y.o. year old male who is a primary patient of [name], [name], MD.  The CCM team was consulted for assistance with disease management and care coordination needs.    Engaged with patient by telephone for follow up visit in response to provider referral for pharmacy case management and/or care coordination services.   Consent to Services:  The patient was given information about Chronic Care Management services, agreed to services, and gave verbal consent prior to initiation of services.  Please see initial visit note for detailed documentation.   Patient Care Team: Birdie Sons, MD as PCP - General (Family Medicine) Corey Skains, MD as Consulting Physician (Cardiology) Birder Robson, MD as Referring Physician (Ophthalmology) Ottie Glazier, MD as Consulting Physician (Pulmonary Disease) Germaine Pomfret, [facility] (Pharmacist)  Recent office visits: 10/28/22: Patient presented to Dr. [name] Section for HF follow-up. 10/21/22: Patient presented to Dr. [name] Section for HF follow-up. Torsemide 20 mg daily.  10/15/22: Patient presented to [name], PA-C for orthopnea.  08/06/22: Patient presented to Dr. Alba Cory for dizziness. Losartan 25.  Recent consult visits: 09/10/22: Patient presented to Leroy Libman, [place] (Cardiology) for follow-up. Torsemide 40 mg daily. Carvedilol 12.5 mg twice daily.   Hospital visits: 09/27/22: Patient presented to ED for sinusitis. Doxycycline.  07/23/22: Patient hospitalized for Carotid PTA/Stent.   Objective:  Lab Results  Component Value Date   CREATININE 2.93 (H) 10/27/2022   BUN 57 (H) 10/27/2022   GFRNONAA 21 (L) 10/27/2022   GFRAA  60 11/21/2020   NA 139 10/27/2022   K 3.6 10/27/2022   CALCIUM 9.0 10/27/2022   CO2 23 10/27/2022   GLUCOSE 173 (H) 10/27/2022    Lab Results  Component Value Date/Time   HGBA1C 6.5 (H) 08/06/2022 09:59 AM   HGBA1C 6.5 (H) 03/26/2022 02:03 PM    Last diabetic Eye exam:  Lab Results  Component Value Date/Time   HMDIABEYEEXA No Retinopathy 01/07/2022 12:00 AM    Last diabetic Foot exam: No results found for: "HMDIABFOOTEX"   Lab Results  Component Value Date   CHOL 112 12/11/2021   HDL 38 (L) 12/11/2021   LDLCALC 54 12/11/2021   TRIG 107 12/11/2021   CHOLHDL 2.9 12/11/2021       Latest Ref Rng & Units 10/27/2022    7:05 AM 10/15/2022   10:14 AM 06/25/2022    3:12 PM  Hepatic Function  Total Protein 6.5 - 8.1 g/dL 7.2  7.0  7.0   Albumin 3.5 - 5.0 g/dL 3.5  4.1  4.2   AST 15 - 41 U/L _0 ALT 0 - 44 U/L _1 Alk Phosphatase 38 - 126 U/L 131  129  139   Total Bilirubin 0.3 - 1.2 mg/dL 1.7  1.3  0.6   Bilirubin, Direct 0.0 - 0.2 mg/dL 0.4       Lab Results  Component Value Date/Time   TSH 2.010 03/26/2022 02:03 PM   TSH 1.331 11/23/2019 04:58 AM   TSH 3.430 05/15/2015 09:08 AM       Latest Ref Rng & Units 10/27/2022    7:05 AM 10/15/2022   10:14 AM 03/26/2022  2:03 PM  CBC  WBC 4.0 - 10.5 K/uL 6.2  6.5  7.6   Hemoglobin 13.0 - 17.0 g/dL 12.4  13.2  15.4   Hematocrit 39.0 - 52.0 % 38.6  39.8  45.3   Platelets 150 - 400 K/uL 137  158  187     Lab Results  Component Value Date/Time   VD25OH 46.58 04/19/2020 04:16 PM   VD25OH 47.4 05/15/2015 09:08 AM    Clinical ASCVD: Yes  The ASCVD Risk score (Arnett DK, et al., 2019) failed to calculate for the following reasons:   The 2019 ASCVD risk score is only valid for ages 51 to 37   The patient has a prior MI or stroke diagnosis       08/06/2022    9:00 AM 05/07/2022   11:20 AM 03/29/2022    2:01 PM  Depression screen PHQ 2/9  Decreased Interest 0 1 1  Down, Depressed, Hopeless 0 1 1  PHQ - 2  Score 0 2 2  Altered sleeping  0 0  Tired, decreased energy  2 3  Change in appetite  0 0  Feeling bad or failure about yourself   0 1  Trouble concentrating  0 0  Moving slowly or fidgety/restless  0 0  Suicidal thoughts  0 0  PHQ-9 Score  4 6  Difficult doing work/chores  Not difficult at all Not difficult at all    Social History   Tobacco Use  Smoking Status Former   Packs/day: 1.00   Types: Cigarettes   Quit date: 11/26/1999   Years since quitting: 22.9  Smokeless Tobacco Never   BP Readings from Last 3 Encounters:  10/28/22 107/75  10/27/22 114/80  10/21/22 127/80   Pulse Readings from Last 3 Encounters:  10/28/22 65  10/27/22 66  10/21/22 68   Wt Readings from Last 3 Encounters:  10/28/22 188 lb (85.3 kg)  10/27/22 178 lb (80.7 kg)  10/21/22 186 lb (84.4 kg)   BMI Readings from Last 3 Encounters:  10/28/22 26.98 kg/m  10/27/22 25.54 kg/m  10/21/22 26.69 kg/m    Assessment/Interventions: Review of patient past medical history, allergies, medications, health status, including review of consultants reports, laboratory and other test data, was performed as part of comprehensive evaluation and provision of chronic care management services.   SDOH:  (Social Determinants of Health) assessments and interventions performed: Yes SDOH Interventions    Flowsheet Row Clinical Support from 05/07/2022 in [place] Management from 01/21/2022 in [place] Visit from 12/11/2021 in [place] Management from 06/04/2021 in [place] Visit from 05/08/2021 in [place] Management from 04/27/2021 in [place] Interventions Intervention Not Indicated -- -- -- -- --  Housing Interventions Intervention Not Indicated -- -- -- -- --  Transportation Interventions Intervention Not Indicated -- -- --  -- --  Depression Interventions/Treatment  Medication, Currently on Treatment -- PHQ2-9 Score <4 Follow-up Not Indicated -- PHQ2-9 Score <4 Follow-up Not Indicated --  Financial Strain Interventions Intervention Not Indicated Intervention Not Indicated -- Intervention Not Indicated -- Other (Comment)  [PAP]  Physical Activity Interventions Patient Refused -- -- -- -- --  Stress Interventions Intervention Not Indicated -- -- -- -- --  Social Connections Interventions Intervention Not Indicated -- -- -- -- --        SDOH Screenings  Food Insecurity: No Food Insecurity (05/07/2022)  Housing: Low Risk  (05/07/2022)  Transportation Needs: No Transportation Needs (05/07/2022)  Alcohol Screen: Low Risk  (05/07/2022)  Depression (PHQ2-9): Low Risk  (08/06/2022)  Financial Resource Strain: Low Risk  (05/07/2022)  Physical Activity: Insufficiently Active (04/18/2021)  Social Connections: Moderately Isolated (05/07/2022)  Stress: No Stress Concern Present (05/07/2022)  Tobacco Use: Medium Risk (10/28/2022)    CCM Care Plan  Allergies  Allergen Reactions   Amlodipine Besylate Swelling    Had a reaction when taking with colcrys    Crestor [Rosuvastatin]     Muscle cramps and pain   Rocephin [Ceftriaxone]     unknown    Medications Reviewed Today     Reviewed by Julieta Bellini, CMA (Certified Medical Assistant) on 10/28/22 at 1314  Med List Status: <None>   Medication Order Taking? Sig Documenting Provider Last Dose Status Informant  allopurinol (ZYLOPRIM) 300 MG tablet [id number] Yes Take 0.5 tablets (150 mg total) by mouth daily. Birdie Sons, MD Taking Active Self  apixaban (ELIQUIS) 5 MG TABS tablet [id number] Yes Take 1 tablet (5 mg total) by mouth 2 (two) times daily. Schnier, Dolores Lory, MD Taking Active   aspirin 81 MG EC tablet [id number] Yes Take 81 mg by mouth daily.  [provider] Taking Active Self           Med Note Michaelle Birks, Cathe Mons A   Wed Jan 24, 2021  8:17 AM)     atorvastatin (LIPITOR) 40 MG tablet [id number] Yes TAKE 1 TABLET(40 MG) BY MOUTH DAILY Birdie Sons, MD Taking Active Self  Calcium Carbonate Antacid 600 MG chewable tablet [id number] Yes Chew 1 tablet by mouth daily. [provider] Taking Active Self  carvedilol (COREG) 6.25 MG tablet [id number] Yes Take 1 tablet (6.25 mg total) by mouth 2 (two) times daily with a meal. Birdie Sons, MD Taking Active Self  Cyanocobalamin (B-12) 2500 MCG TABS [id number] Yes Take 2,500 mcg by mouth daily. [provider] Taking Active Self  doxycycline (VIBRA-TABS) 100 MG tablet [id number] Yes Take 1 tablet (100 mg total) by mouth 2 (two) times daily for 7 days. Merlyn Lot, MD Taking Active   GLUCOSAMINE HCL PO [id number] Yes Take 1,500 mg by mouth daily. [provider] Taking Active Self  glucose blood (ONETOUCH VERIO) test strip [id number] Yes Use as instructed to check sugar daily [name], [name], MD Taking Active Self  losartan (COZAAR) 50 MG tablet [id number] Yes TAKE 1/2 TABLET(25 MG) BY MOUTH DAILY Simmons-Robinson, Makiera, MD Taking Active   magnesium oxide (MAG-OX) 400 MG tablet [id number] Yes Take 500 mg by mouth 2 (two) times daily. [provider] Taking Active Self  metFORMIN (GLUCOPHAGE-XR) 500 MG 24 hr tablet [id number] Yes TAKE 2 TABLETS(1000 MG) BY MOUTH DAILY Birdie Sons, MD Taking Active Self  pantoprazole (PROTONIX) 40 MG tablet [id number] Yes TAKE 1 TABLET BY MOUTH EVERY DAY Birdie Sons, MD Taking Active Self  potassium chloride SA (KLOR-CON) 20 MEQ tablet [id number] Yes Take 20 mEq by mouth daily. [provider] Taking Active Self           Med Note Michaelle Birks, [place] Jan 23, 2021  2:10 PM) Prescribed by Hassell Done, NP   torsemide (DEMADEX) 20 MG tablet [id number] Yes Take 40 mg by mouth daily. [provider] Taking Active Self           Med Note Michaelle Birks, Cathe Mons A   Tue Jan 23, 2021  2:10 PM) Prescribed by Dr.  Nehemiah Massed   triamcinolone ointment (KENALOG) 0.1 % [id number] Yes APPLY TOPICALLY TWICE DAILY AS DIRECTED Birdie Sons, MD Taking Active Self            Patient Active Problem List   Diagnosis Date Noted   Orthopnea 10/15/2022   Acute on chronic systolic congestive heart failure ([place]) 10/15/2022   Dizziness on standing 08/06/2022   Carotid stenosis, symptomatic w/o infarct, left 07/24/2022   Symptomatic carotid artery narrowing without infarction, left 07/05/2022   Dizziness 03/26/2022   Atrophy of kidney 09/27/2021   COVID-19 09/27/2021   Heart failure, unspecified ([place]) 08/22/2021   Other abnormal findings in urine 08/22/2021   Other specified complication of vascular prosthetic devices, implants and grafts, initial encounter ([place]) 08/22/2021   Atherosclerotic heart disease of native coronary artery with unspecified angina pectoris ([place]) 08/22/2021   Implantable cardioverter-defibrillator (ICD) in situ 08/22/2021   Stage 3b chronic kidney disease ([place]) 08/22/2021   Type 2 diabetes mellitus with diabetic chronic kidney disease ([place]) 08/22/2021   Hypertension 08/22/2021   Idiopathic gout, unspecified site 08/22/2021   Pure hypercholesterolemia, unspecified 08/22/2021   Other psoriasis 08/22/2021   Aortic atherosclerosis ([place]) 05/08/2021   Abdominal pain 03/01/2021   Diabetes mellitus without complication ([place]) 70/35/0093   Diarrhea 09/12/2020   Acute kidney injury superimposed on CKD ([place]) 08/10/2020   Hypomagnesemia 04/26/2020   Hypocalcemia 04/19/2020   Numbness and tingling in both hands 04/19/2020   Atrial fibrillation ([place]) 04/19/2020   Other specified interstitial pulmonary diseases ([place]) 12/06/2019   Abdominal pain    Pancreatitis 11/22/2019   CAP (community acquired pneumonia) 11/13/2019   Hyperuricemia 03/31/2019   Numbness 05/07/2018   Benign colonic polyp 03/24/2017   Tibialis anterior tenosynovitis 12/21/2015   Pleural nodule 07/27/2015   Psoriatic  arthritis ([place]) 05/12/2015   Restrictive lung disease 05/12/2015   Barrett esophagus 03/30/2015   Ache in joint 03/30/2015   Bradycardia 03/30/2015   CAD (coronary artery disease) 03/30/2015   Cardiac defibrillator in place 03/30/2015   Chronic kidney disease (CKD), stage III (moderate) ([place]) 03/30/2015   Cluster headache syndrome 03/30/2015   Degeneration of lumbar or lumbosacral intervertebral disc 03/30/2015   Gout 03/30/2015   Personal history of malignant neoplasm of bladder 03/30/2015   Hypercholesteremia 03/30/2015   LBP (low back pain) 03/30/2015   Personal history of malignant melanoma of skin 03/30/2015   Muscle ache 03/30/2015   Arthritis of knee, degenerative 03/30/2015   Psoriasis 03/30/2015   Obstructive sleep apnea 03/30/2015   Chronic venous insufficiency 03/30/2015   Low back pain 03/30/2015   History of abdominal aortic aneurysm (AAA) 06/24/2013   History of CVA (cerebrovascular accident) 06/24/2013   Cardiomyopathy, ischemic 06/24/2013   History of AAA (abdominal aortic aneurysm) repair 06/24/2013   Essential (primary) hypertension 08/17/2011   Arrhythmia, sinus node 08/17/2011   History of ventricular fibrillation 81/82/9937   Systolic CHF with reduced left ventricular function, NYHA class 3 ([place]) 02/26/2010   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 04/27/2007   Cardiac pacemaker in situ 10/10/2006    Immunization History  Administered Date(s) Administered   Fluad Quad(high Dose 65+) 10/04/2020, 08/06/2022   Influenza Split 09/18/2011   Influenza, High Dose Seasonal PF 08/16/2014, 10/09/2016, 10/09/2017, 08/26/2018   Influenza,inj,Quad PF,6+ Mos 12/20/2015   PFIZER(Purple Top)SARS-COV-2 Vaccination 02/24/2020, 03/21/2020   Pneumococcal Conjugate-13 05/09/2014   Pneumococcal Polysaccharide-23 05/12/2015    Conditions to be addressed/monitored:  Hypertension, Hyperlipidemia, Diabetes, Atrial Fibrillation, Heart  Failure, Coronary Artery  Disease, Chronic Kidney Disease and Gout  There are no care plans that you recently modified to display for this patient.    Medication Assistance: None required.  Patient affirms current coverage meets needs.  Compliance/Adherence/Medication fill history: Care Gaps: Ophthalmology Exam  Shingrix, Covid, Pneumonia Vaccines   Star-Rating Drugs: Atorvastatin 40 mg: LF 12/11/21 for 90-DS Losartan 50 mg: LF 01/10/22 for 90-DS Metformin XR: LF 12/11/21 for 90-DS  Patient's preferred pharmacy is:  [id number] - [address], [address] [place] [address] Phone: [phone] Fax: [phone]   Uses pill box? Yes Pt endorses 100% compliance  We discussed: Current pharmacy is preferred with insurance plan and patient is satisfied with pharmacy services Patient decided to: Continue current medication management strategy  Care Plan and Follow Up Patient Decision:  Patient agrees to Care Plan and Follow-up.  Plan: Telephone follow up appointment with care management team member scheduled for:  10/28/2022 at 3:00 PM  [name], PharmD, [place], CPP  Clinical Pharmacist Practitioner  [facility] [phone]  Current Barriers:  Unable to independently afford treatment regimen Unable to achieve control of diabetes   Pharmacist Clinical Goal(s):  Over the next 90 days, patient will verbalize ability to afford treatment regimen achieve control of diabetes as evidenced by A1c less than 8% through collaboration with PharmD and provider.   Interventions: 1:1 collaboration with Birdie Sons, MD regarding development and update of comprehensive plan of care as evidenced by provider attestation and co-signature Inter-disciplinary care team collaboration (see longitudinal plan of care) Comprehensive medication review performed; medication list updated in electronic medical record  Heart Failure (Goal:  control symptoms and prevent exacerbations) -Controlled Type: Systolic -NYHA Class: III (marked limitation of activity) -Ejection fraction: 35-40% (Date: Sep 2021) -Current treatment: Carvedilol 12.5 mg daily  Losartan 25 mg daily  Torsemide 40 mg daily  -Medications previously tried: Entresto, Amlodipine, valsartan, spironolactone, Carvedilol -Current home BP/HR readings:   120-130s/70-80s  -weight has been stable, ranging from 187-195  -Educated on Importance of weighing daily; if you gain more than 3 pounds in one day or 5 pounds in one week, contact provider's office -CHANGE Carvedilol to 6.25 mg twice daily  Hyperlipidemia: (LDL goal < 70) -History of CAD, history of CVA  -Controlled -Current treatment: Atorvastatin 40 mg daily  -Medications previously tried: NA  -Educated on Benefits of statin for ASCVD risk reduction; Importance of limiting foods high in cholesterol; -Recommended to continue current medication  Diabetes (A1c goal <8%) -Diagnosed Jan 2021 -Controlled -Current medications: Metformin XR 500 mg 2 tablets daily: Appropriate, Effective, Safe, Accessible  -Medications previously tried: NA  -Current home glucose readings fasting glucose: 120 -Denies hypoglycemic/hyperglycemic symptoms  -Current meal patterns: cut out sodas. Replaced with twist water. 2 sandwiches with lunch down to 1.  -Continue current medications   Atrial Fibrillation (Goal: prevent stroke and major bleeding) -Controlled -CHADSVASC: 8 -Current treatment: Rate control: Carvedilol 6.25 mg twice daily: Appropriate, Effective, Safe, Accessible Anticoagulation: Eliquis 5 mg daily: Appropriate, Effective, Safe, Accessible   -Medications previously tried: NA -Home BP and HR readings: NA  -Continue current medications  Gout (Goal: prevent gout flares) -Controlled -Last Uric acid elevated above goal, but patient asymptomatic.  -Current treatment  Allopurinol 300 mg 1/2 tablets daily   -Medications previously tried: NA  -Last flare: 2 years  -Counseled on diet and exercise extensively Recommended to continue  current medication  GERD (Goal: Minimize symptoms of heartburn or reflux) -History of Barrett's Esophagus  -Controlled -Current treatment  Pantoprazole 40 mg daily  -Medications previously tried: NA  -Recommended to continue current medication  Chronic Kidney Disease Stage 3b  -All medications assessed for renal dosing and appropriateness in chronic kidney disease. -Could consider SGLT2 inhibitor in the future.  -Recommended to continue current medication  Patient Goals/Self-Care Activities Over the next 90 days, patient will:  - check glucose daily before breakfast, document, and provide at future appointments check blood pressure 2-3 times weekly , document, and provide at future appointments weigh daily, and contact provider if weight gain of greater than 3 pounds in one day  Follow Up Plan: ***

## 2022-10-29 LAB — BRAIN NATRIURETIC PEPTIDE: BNP: 350.3 pg/mL — ABNORMAL HIGH (ref 0.0–100.0)

## 2022-10-30 ENCOUNTER — Emergency Department: Payer: Medicare Other

## 2022-10-30 ENCOUNTER — Other Ambulatory Visit: Payer: Self-pay

## 2022-10-30 ENCOUNTER — Inpatient Hospital Stay
Admit: 2022-10-30 | Discharge: 2022-10-30 | Disposition: A | Discharge status: Home / Self Care | Payer: Medicare Other | Attending: Student in an Organized Health Care Education/Training Program | Admitting: Student in an Organized Health Care Education/Training Program

## 2022-10-30 ENCOUNTER — Ambulatory Visit (INDEPENDENT_AMBULATORY_CARE_PROVIDER_SITE_OTHER): Payer: Medicare Other | Admitting: Family Medicine

## 2022-10-30 ENCOUNTER — Encounter: Payer: Self-pay | Admitting: Family Medicine

## 2022-10-30 ENCOUNTER — Ambulatory Visit: Payer: Self-pay

## 2022-10-30 ENCOUNTER — Inpatient Hospital Stay
Admission: EM | Admit: 2022-10-30 | Discharge: 2022-11-02 | DRG: 291 | Disposition: A | Discharge status: Home / Self Care | Payer: Medicare Other | Attending: Student in an Organized Health Care Education/Training Program | Admitting: Student in an Organized Health Care Education/Training Program

## 2022-10-30 VITALS — BP 140/70 | HR 61 | Resp 20 | Wt 189.0 lb

## 2022-10-30 DIAGNOSIS — Z1152 Encounter for screening for COVID-19: Secondary | ICD-10-CM | POA: Diagnosis not present

## 2022-10-30 DIAGNOSIS — N179 Acute kidney failure, unspecified: Secondary | ICD-10-CM | POA: Diagnosis not present

## 2022-10-30 DIAGNOSIS — I255 Ischemic cardiomyopathy: Secondary | ICD-10-CM | POA: Diagnosis present

## 2022-10-30 DIAGNOSIS — I081 Rheumatic disorders of both mitral and tricuspid valves: Secondary | ICD-10-CM | POA: Diagnosis present

## 2022-10-30 DIAGNOSIS — M109 Gout, unspecified: Secondary | ICD-10-CM | POA: Diagnosis present

## 2022-10-30 DIAGNOSIS — I340 Nonrheumatic mitral (valve) insufficiency: Secondary | ICD-10-CM | POA: Diagnosis not present

## 2022-10-30 DIAGNOSIS — G4733 Obstructive sleep apnea (adult) (pediatric): Secondary | ICD-10-CM | POA: Diagnosis present

## 2022-10-30 DIAGNOSIS — I5033 Acute on chronic diastolic (congestive) heart failure: Secondary | ICD-10-CM | POA: Diagnosis not present

## 2022-10-30 DIAGNOSIS — Z951 Presence of aortocoronary bypass graft: Secondary | ICD-10-CM

## 2022-10-30 DIAGNOSIS — Z9861 Coronary angioplasty status: Secondary | ICD-10-CM | POA: Diagnosis not present

## 2022-10-30 DIAGNOSIS — R0902 Hypoxemia: Secondary | ICD-10-CM

## 2022-10-30 DIAGNOSIS — K219 Gastro-esophageal reflux disease without esophagitis: Secondary | ICD-10-CM | POA: Diagnosis present

## 2022-10-30 DIAGNOSIS — E785 Hyperlipidemia, unspecified: Secondary | ICD-10-CM | POA: Diagnosis present

## 2022-10-30 DIAGNOSIS — R0609 Other forms of dyspnea: Secondary | ICD-10-CM | POA: Diagnosis not present

## 2022-10-30 DIAGNOSIS — Z8551 Personal history of malignant neoplasm of bladder: Secondary | ICD-10-CM

## 2022-10-30 DIAGNOSIS — I714 Abdominal aortic aneurysm, without rupture, unspecified: Secondary | ICD-10-CM | POA: Diagnosis not present

## 2022-10-30 DIAGNOSIS — I252 Old myocardial infarction: Secondary | ICD-10-CM

## 2022-10-30 DIAGNOSIS — M1000 Idiopathic gout, unspecified site: Secondary | ICD-10-CM | POA: Diagnosis not present

## 2022-10-30 DIAGNOSIS — R7989 Other specified abnormal findings of blood chemistry: Secondary | ICD-10-CM | POA: Diagnosis not present

## 2022-10-30 DIAGNOSIS — I5020 Unspecified systolic (congestive) heart failure: Secondary | ICD-10-CM | POA: Diagnosis not present

## 2022-10-30 DIAGNOSIS — Z95810 Presence of automatic (implantable) cardiac defibrillator: Secondary | ICD-10-CM

## 2022-10-30 DIAGNOSIS — I2510 Atherosclerotic heart disease of native coronary artery without angina pectoris: Secondary | ICD-10-CM | POA: Diagnosis not present

## 2022-10-30 DIAGNOSIS — N1832 Chronic kidney disease, stage 3b: Secondary | ICD-10-CM | POA: Diagnosis present

## 2022-10-30 DIAGNOSIS — E1122 Type 2 diabetes mellitus with diabetic chronic kidney disease: Secondary | ICD-10-CM | POA: Diagnosis present

## 2022-10-30 DIAGNOSIS — J9621 Acute and chronic respiratory failure with hypoxia: Secondary | ICD-10-CM | POA: Diagnosis not present

## 2022-10-30 DIAGNOSIS — R0600 Dyspnea, unspecified: Principal | ICD-10-CM

## 2022-10-30 DIAGNOSIS — I509 Heart failure, unspecified: Secondary | ICD-10-CM | POA: Diagnosis not present

## 2022-10-30 DIAGNOSIS — Z8673 Personal history of transient ischemic attack (TIA), and cerebral infarction without residual deficits: Secondary | ICD-10-CM | POA: Diagnosis not present

## 2022-10-30 DIAGNOSIS — I130 Hypertensive heart and chronic kidney disease with heart failure and stage 1 through stage 4 chronic kidney disease, or unspecified chronic kidney disease: Principal | ICD-10-CM | POA: Diagnosis present

## 2022-10-30 DIAGNOSIS — N189 Chronic kidney disease, unspecified: Secondary | ICD-10-CM

## 2022-10-30 DIAGNOSIS — I5021 Acute systolic (congestive) heart failure: Secondary | ICD-10-CM | POA: Diagnosis not present

## 2022-10-30 DIAGNOSIS — J810 Acute pulmonary edema: Secondary | ICD-10-CM | POA: Insufficient documentation

## 2022-10-30 DIAGNOSIS — Z7984 Long term (current) use of oral hypoglycemic drugs: Secondary | ICD-10-CM

## 2022-10-30 DIAGNOSIS — R531 Weakness: Secondary | ICD-10-CM | POA: Diagnosis not present

## 2022-10-30 DIAGNOSIS — L409 Psoriasis, unspecified: Secondary | ICD-10-CM | POA: Diagnosis present

## 2022-10-30 DIAGNOSIS — Z8679 Personal history of other diseases of the circulatory system: Secondary | ICD-10-CM | POA: Diagnosis not present

## 2022-10-30 DIAGNOSIS — Z85820 Personal history of malignant melanoma of skin: Secondary | ICD-10-CM | POA: Diagnosis not present

## 2022-10-30 DIAGNOSIS — Z955 Presence of coronary angioplasty implant and graft: Secondary | ICD-10-CM

## 2022-10-30 DIAGNOSIS — N183 Chronic kidney disease, stage 3 unspecified: Secondary | ICD-10-CM | POA: Diagnosis not present

## 2022-10-30 DIAGNOSIS — Z8261 Family history of arthritis: Secondary | ICD-10-CM

## 2022-10-30 DIAGNOSIS — Z87891 Personal history of nicotine dependence: Secondary | ICD-10-CM | POA: Diagnosis not present

## 2022-10-30 DIAGNOSIS — Z7901 Long term (current) use of anticoagulants: Secondary | ICD-10-CM | POA: Diagnosis not present

## 2022-10-30 DIAGNOSIS — I5023 Acute on chronic systolic (congestive) heart failure: Secondary | ICD-10-CM | POA: Diagnosis present

## 2022-10-30 DIAGNOSIS — I5043 Acute on chronic combined systolic (congestive) and diastolic (congestive) heart failure: Secondary | ICD-10-CM | POA: Diagnosis not present

## 2022-10-30 DIAGNOSIS — Z79899 Other long term (current) drug therapy: Secondary | ICD-10-CM

## 2022-10-30 DIAGNOSIS — I700 Atherosclerosis of aorta: Secondary | ICD-10-CM | POA: Diagnosis present

## 2022-10-30 DIAGNOSIS — R569 Unspecified convulsions: Secondary | ICD-10-CM | POA: Diagnosis not present

## 2022-10-30 DIAGNOSIS — R404 Transient alteration of awareness: Secondary | ICD-10-CM | POA: Diagnosis not present

## 2022-10-30 DIAGNOSIS — R0602 Shortness of breath: Secondary | ICD-10-CM | POA: Diagnosis not present

## 2022-10-30 DIAGNOSIS — Z888 Allergy status to other drugs, medicaments and biological substances status: Secondary | ICD-10-CM

## 2022-10-30 DIAGNOSIS — Z743 Need for continuous supervision: Secondary | ICD-10-CM | POA: Diagnosis not present

## 2022-10-30 DIAGNOSIS — Z7982 Long term (current) use of aspirin: Secondary | ICD-10-CM

## 2022-10-30 DIAGNOSIS — Z8249 Family history of ischemic heart disease and other diseases of the circulatory system: Secondary | ICD-10-CM

## 2022-10-30 LAB — COMPREHENSIVE METABOLIC PANEL
ALT: 28 U/L (ref 0–44)
AST: 48 U/L — ABNORMAL HIGH (ref 15–41)
Albumin: 3.5 g/dL (ref 3.5–5.0)
Alkaline Phosphatase: 114 U/L (ref 38–126)
Anion gap: 9 (ref 5–15)
BUN: 80 mg/dL — ABNORMAL HIGH (ref 8–23)
CO2: 22 mmol/L (ref 22–32)
Calcium: 9.1 mg/dL (ref 8.9–10.3)
Chloride: 107 mmol/L (ref 98–111)
Creatinine, Ser: 4.28 mg/dL — ABNORMAL HIGH (ref 0.61–1.24)
GFR, Estimated: 13 mL/min — ABNORMAL LOW (ref >60)
Glucose, Bld: 130 mg/dL — ABNORMAL HIGH (ref 70–99)
Potassium: 5.3 mmol/L — ABNORMAL HIGH (ref 3.5–5.1)
Sodium: 138 mmol/L (ref 135–145)
Total Bilirubin: 2.1 mg/dL — ABNORMAL HIGH (ref 0.3–1.2)
Total Protein: 7.2 g/dL (ref 6.5–8.1)

## 2022-10-30 LAB — CBC WITH DIFFERENTIAL/PLATELET
Abs Immature Granulocytes: 0.04 10*3/uL (ref 0.00–0.07)
Basophils Absolute: 0.1 10*3/uL (ref 0.0–0.1)
Basophils Relative: 1 %
Eosinophils Absolute: 0.1 10*3/uL (ref 0.0–0.5)
Eosinophils Relative: 2 %
HCT: 42.2 % (ref 39.0–52.0)
Hemoglobin: 13.2 g/dL (ref 13.0–17.0)
Immature Granulocytes: 1 %
Lymphocytes Relative: 28 %
Lymphs Abs: 1.4 10*3/uL (ref 0.7–4.0)
MCH: 32.2 pg (ref 26.0–34.0)
MCHC: 31.3 g/dL (ref 30.0–36.0)
MCV: 102.9 fL — ABNORMAL HIGH (ref 80.0–100.0)
Monocytes Absolute: 0.4 10*3/uL (ref 0.1–1.0)
Monocytes Relative: 8 %
Neutro Abs: 3 10*3/uL (ref 1.7–7.7)
Neutrophils Relative %: 60 %
Platelets: 127 10*3/uL — ABNORMAL LOW (ref 150–400)
RBC: 4.1 MIL/uL — ABNORMAL LOW (ref 4.22–5.81)
RDW: 16.6 % — ABNORMAL HIGH (ref 11.5–15.5)
WBC: 5 10*3/uL (ref 4.0–10.5)
nRBC: 0 % (ref 0.0–0.2)

## 2022-10-30 LAB — LACTIC ACID, PLASMA
Lactic Acid, Venous: 2.8 mmol/L (ref 0.5–1.9)
Lactic Acid, Venous: 2.8 mmol/L (ref 0.5–1.9)

## 2022-10-30 LAB — TROPONIN I (HIGH SENSITIVITY)
Troponin I (High Sensitivity): 30 ng/L — ABNORMAL HIGH (ref <18)
Troponin I (High Sensitivity): 30 ng/L — ABNORMAL HIGH (ref <18)

## 2022-10-30 LAB — POCT INFLUENZA A/B
Influenza A, POC: NEGATIVE
Influenza B, POC: NEGATIVE

## 2022-10-30 LAB — D-DIMER, QUANTITATIVE: D-Dimer, Quant: 2.52 ug/mL-FEU — ABNORMAL HIGH (ref 0.00–0.50)

## 2022-10-30 LAB — POC COVID19 BINAXNOW: SARS Coronavirus 2 Ag: NEGATIVE

## 2022-10-30 LAB — BRAIN NATRIURETIC PEPTIDE: B Natriuretic Peptide: 554.5 pg/mL — ABNORMAL HIGH (ref 0.0–100.0)

## 2022-10-30 LAB — CREATININE, SERUM
Creatinine, Ser: 4.37 mg/dL — ABNORMAL HIGH (ref 0.61–1.24)
GFR, Estimated: 13 mL/min — ABNORMAL LOW (ref >60)

## 2022-10-30 MED ORDER — CARVEDILOL 6.25 MG PO TABS
6.2500 mg | ORAL_TABLET | Freq: Two times a day (BID) | ORAL | Status: DC
  Administered 2022-10-31 – 2022-11-02 (×5): 6.25 mg via ORAL
  Filled 2022-10-30 (×5): qty 1

## 2022-10-30 MED ORDER — ONDANSETRON HCL 4 MG/2ML IJ SOLN: 4.0000 mg | Freq: Four times a day (QID) | INTRAMUSCULAR | Status: DC | PRN

## 2022-10-30 MED ORDER — ACETAMINOPHEN 325 MG PO TABS
650.0000 mg | ORAL_TABLET | Freq: Four times a day (QID) | ORAL | Status: DC | PRN
  Administered 2022-10-31: 650 mg via ORAL
  Filled 2022-10-30: qty 2

## 2022-10-30 MED ORDER — POLYETHYLENE GLYCOL 3350 17 G PO PACK: 17.0000 g | PACK | Freq: Every day | ORAL | Status: DC | PRN

## 2022-10-30 MED ORDER — APIXABAN 5 MG PO TABS
5.0000 mg | ORAL_TABLET | Freq: Two times a day (BID) | ORAL | Status: DC
  Administered 2022-10-30 – 2022-11-01 (×4): 5 mg via ORAL
  Filled 2022-10-30 (×4): qty 1

## 2022-10-30 MED ORDER — ALLOPURINOL 100 MG PO TABS
150.0000 mg | ORAL_TABLET | Freq: Every day | ORAL | Status: DC
  Administered 2022-10-31 – 2022-11-02 (×3): 150 mg via ORAL
  Filled 2022-10-30 (×2): qty 2
  Filled 2022-10-30: qty 0.5

## 2022-10-30 MED ORDER — BISACODYL 5 MG PO TBEC: 5.0000 mg | DELAYED_RELEASE_TABLET | Freq: Every day | ORAL | Status: DC | PRN

## 2022-10-30 MED ORDER — TRIAMCINOLONE ACETONIDE 0.1 % EX OINT: TOPICAL_OINTMENT | Freq: Two times a day (BID) | CUTANEOUS | Status: DC | PRN

## 2022-10-30 MED ORDER — ASPIRIN 81 MG PO TBEC
81.0000 mg | DELAYED_RELEASE_TABLET | Freq: Every day | ORAL | Status: DC
  Administered 2022-10-31 – 2022-11-02 (×3): 81 mg via ORAL
  Filled 2022-10-30 (×4): qty 1

## 2022-10-30 MED ORDER — ACETAMINOPHEN 650 MG RE SUPP: 650.0000 mg | Freq: Four times a day (QID) | RECTAL | Status: DC | PRN

## 2022-10-30 MED ORDER — ATORVASTATIN CALCIUM 20 MG PO TABS
40.0000 mg | ORAL_TABLET | Freq: Every day | ORAL | Status: DC
  Administered 2022-10-31 – 2022-11-02 (×3): 40 mg via ORAL
  Filled 2022-10-30 (×3): qty 2

## 2022-10-30 MED ORDER — ALUM & MAG HYDROXIDE-SIMETH 200-200-20 MG/5ML PO SUSP
30.0000 mL | Freq: Once | ORAL | Status: AC
  Administered 2022-10-30: 30 mL via ORAL
  Filled 2022-10-30: qty 30

## 2022-10-30 MED ORDER — LOSARTAN POTASSIUM 50 MG PO TABS
25.0000 mg | ORAL_TABLET | Freq: Every day | ORAL | Status: DC
  Administered 2022-10-31: 25 mg via ORAL
  Filled 2022-10-30: qty 1

## 2022-10-30 MED ORDER — PERFLUTREN LIPID MICROSPHERE
1.0000 mL | INTRAVENOUS | Status: AC | PRN
  Administered 2022-10-30: 2 mL via INTRAVENOUS

## 2022-10-30 MED ORDER — ONDANSETRON HCL 4 MG PO TABS: 4.0000 mg | ORAL_TABLET | Freq: Four times a day (QID) | ORAL | Status: DC | PRN

## 2022-10-30 MED ORDER — FUROSEMIDE 10 MG/ML IJ SOLN
60.0000 mg | Freq: Two times a day (BID) | INTRAMUSCULAR | Status: DC
  Administered 2022-10-30 – 2022-10-31 (×2): 60 mg via INTRAVENOUS
  Filled 2022-10-30 (×2): qty 8

## 2022-10-30 NOTE — H&P (Signed)
History and Physical    Taylor [name] YKD:[id number] DOB: [birth year] DOA: 10/30/2022 PCP: Birdie Sons, MD  Chief Complaint: DOE Historian: patient  HPI:  Taylor [name] is a 81 y.o. male with a PMH significant for HFrEF, Barrett esophagus, CAD s/p stent placement, s/p AICD, CKDIII, DDD, gout, bladder cancer, HLD, pacemaker placed, psoriasis, AAA, h/o CVA, HTN, ischemic cardiomyopathy, h/o ventricular fibrillation, restrictive lung disease, pleural nodule, h/o pancreatitis, Afib (on eliquis), Type II DM, carotid stenosis. At baseline, they live at home with their wife and are independent with ADLs.  They presented from home to the ED on 10/30/2022 with DOE and orthopnea x 7 days. It acutely worsened today and he is unable to lay flat.  - He had been seen in ED 12/3 for same and prescribed doxycycline for sinusitis.  - He had been evaluated by his primary care doctor on 12/4 and diagnosed with a URVI and they increased his torsemide to TID.   Upon arrival to ED today, he states that he has difficulty ambulating across the room due to SOB which is much worse than his baseline. He has only mild symptoms while resting but is unable to lay flat as this causes his breathing to acutely worsen. States he has been taking his torsemide '20mg'$  TID as directed since seeing his PCP. Denies any LE swelling.  Denies cough, chest pain, diaphoresis, syncope. Denies any changes in his bowel or urine habits. Did not notice increased urine output with his increase frequency of torsemide.  In the ED, it was found that they had stable vital signs ORA. Labs showed negative covid or flu. BNP elevated to 554.5 which is above his baseline. Troponin 30 which is about his baseline. K+ 5.3, LA 2.8, CBC at baseline,  ECG shows possible RBBB not seen previously. Paced rhythm. No ST changes. Significant findings included: Of note, Cr was 4.28 from a baseline around 2.5, D-dimer 2.52 Chest xray showed cardiomegaly and central  vascular congestion.  VQ scan pending.  They were initially treated with IV lasix '60mg'$ .   Patient was admitted to medicine service for further workup and management of SOB and AKI as outlined in detail below.  Assessment/Plan Principal Problem:   Congestive heart failure (CHF) (HCC)   SOB- stable ORA. Etiology is mixed picture. Most likely pulmonary congestion contributing Patient shows signs of CHF with history, vascular congestion on chest xray and elevated BNP and had not shown increased UOP with his increased diuretic at home, however, appears euvolemic on exam. Lower suspicion for infection due to afebrile, no cough, and no consolidations on chest xray, COVID and flu panel negative, although his LA was elevated. Concern for cardiac ischemia high given his significant CAD history and ECG changes from previous but troponin only mildly elevated and at his baseline. Also possibility of PE given elevated D-dimer but no signs of DVT, chest pain, or hemoptysis and patient is adherent with eliquis so very unlikely. Question underlying/undiagnosed pulmonary condition. - repeat troponin, ordered echo, consider cardiology consult - treat fluid overload with increased dose IV lasix '60mg'$ . If this causes Cr to increase, will stop and trial IV fluid hydration instead - follow up VQ scan ordered by EDP - strict I/O, daily weights - continuous cardiac monitoring and pulse oximetry - repeat LA - recommend PFTs  AKI on CKD 3b- Cr elevated to 4.28 on presentation from baseline around 2.5. With mixed picture fluid status, unsure if from hypovolemia or hypervolemia. Trial of increased lasix tonight  and monitor Cr in am. If increased, may switch to trial of IV fluid hydration. K+ 5.3 on presentation and takes 55mq daily at home.  - BMP am - monitor and replete electrolytes PRN - hold home K+ for now  Type II DM- non insulin dependent - monitor blood sugars, add on sSSI if needed - holding home  metformin  CAD s/p PCI  AAA  HFrEF  HTN  Afib- - continuous cardiac monitoring - continue home medications including eliquis, atorvastatin, carvedilol, losartan - f/u echo  H/o CVA - continue ASA  Gout - continue home meds including allopurinol  Holding home nutritional supplements  Past Medical History:  Diagnosis Date   AAA (abdominal aortic aneurysm) [facility]/03/2007   [facility]  AICD (automatic cardioverter/defibrillator) present    Barrett's esophagus    Bladder cancer ([facility]    Bradycardia    CAD (coronary artery disease)    CHF (congestive heart failure) (HCC)    Cluster headache    DDD (degenerative disc disease), lumbar    Dyspnea    WITH EXERTION   Edema    LEFT ANKLE   Fracture of skull base ([facility] [place]   due to fall   GERD (gastroesophageal reflux disease)    Gout    History of bladder cancer 12/1995   Hyperlipidemia    Hypertension    [place]alignant [place] 12/2012   right dorsal forearm excised   Myocardial infarction ([facility]    LAST 2014   Osteoarthritis of knee    Pacemaker 10/10/2006   Pneumonia    2016   Pre-diabetes    Psoriasis    Rib fracture 1997   due to fall   Sleep apnea    CPAP   Stroke ([facility]    Venous incompetence     Past Surgical History:  Procedure Laterality Date   ABDOMINAL AORTIC ANEURYSM REPAIR  06/03/2007   [facility] L[facility]mer[facility]  ANGIOPLASTY  1994   [place]   BLADDER TUMOR EXCISION  12/1995   CAROTID PTA/STENT INTERVENTION Left 07/23/2022   Procedure: CAROTID PTA/STENT INTERVENTION;  Surgeon: SKatha Cabal MD;  [facility];  Service: Cardiovascular;  Laterality: Left;   CATARACT EXTRACTION W/PHACO Left 10/22/2016   Procedure: CATARACT EXTRACTION PHACO AND INTRAOCULAR LENS PLACEMENT (IOC);  Surgeon: WBirder Robson MD;  Location: [facility] ORS;  Service: Ophthalmology;  Laterality: Left;  [place]47.7 AP% 18.4 CDE 8.78 Fluid pack lot # [id number]   CATARACT EXTRACTION W/PHACO Right 12/10/2016   Procedure: CATARACT EXTRACTION PHACO AND INTRAOCULAR LENS PLACEMENT (IOC);  Surgeon: WBirder Robson MD;  Location: [facility] ORS;  Service: Ophthalmology;  Laterality: Right;  [place]00:39 AP% 23.3 CDE 9.13 Fluid pack lot # [id number]   CORONARY ANGIOPLASTY     STENTS X 5   CORONARY ARTERY BYPASS GRAFT  09/22/2006   four   ELBOW BURSA SURGERY     DUE TO GOUT   INSERT / REPLACE / REMOVE PACEMAKER     MELANOMA EXCISION  12/2012   Right forearm     reports that he quit smoking about 22 years ago. His smoking use included cigarettes. He smoked an average of 1 pack per day. He has never used smokeless tobacco. He reports that he does not drink alcohol and does not use drugs.  Allergies  Allergen Reactions   Amlodipine Besylate Swelling    Had a reaction when taking with colcrys    Crestor [Rosuvastatin]  Muscle cramps and pain   Rocephin [Ceftriaxone]     unknown    Family History  Problem Relation Age of Onset   Cancer Mother        Melanoma skin cancer   Heart attack Father 31   Cancer Father        throat cancer   Arthritis Brother     Prior to Admission medications   Medication Sig Start Date End Date Taking? Authorizing Provider  allopurinol (ZYLOPRIM) 300 MG tablet Take 0.5 tablets (150 mg total) by mouth daily. 09/25/22   Birdie Sons, MD  apixaban (ELIQUIS) 5 MG TABS tablet Take 1 tablet (5 mg total) by mouth 2 (two) times daily. 07/24/22 03/15/24  Schnier, Dolores Lory, MD  aspirin 81 MG EC tablet Take 81 mg by mouth daily.     [provider]  atorvastatin (LIPITOR) 40 MG tablet TAKE 1 TABLET(40 MG) BY MOUTH DAILY 05/22/22   Birdie Sons, MD  Calcium Carbonate Antacid 600 MG chewable tablet Chew 1 tablet by mouth daily.    [provider]  carvedilol (COREG) 6.25 MG tablet Take 1 tablet (6.25 mg total) by mouth 2 (two) times daily with a meal. 10/12/21   [name], [name], MD  Cyanocobalamin (B-12) 2500 MCG  TABS Take 2,500 mcg by mouth daily.    [provider]  doxycycline (VIBRA-TABS) 100 MG tablet Take 1 tablet (100 mg total) by mouth 2 (two) times daily for 7 days. 10/27/22 11/03/22  Merlyn Lot, MD  GLUCOSAMINE HCL PO Take 1,500 mg by mouth daily.    [provider]  glucose blood (ONETOUCH VERIO) test strip Use as instructed to check sugar daily 12/14/19   Birdie Sons, MD  losartan (COZAAR) 50 MG tablet TAKE 1/2 TABLET(25 MG) BY MOUTH DAILY 08/06/22   Simmons-Robinson, Riki Sheer, MD  magnesium oxide (MAG-OX) 400 MG tablet Take 500 mg by mouth 2 (two) times daily.    [provider]  metFORMIN (GLUCOPHAGE-XR) 500 MG 24 hr tablet TAKE 2 TABLETS(1000 MG) BY MOUTH DAILY 03/11/22   Birdie Sons, MD  pantoprazole (PROTONIX) 40 MG tablet TAKE 1 TABLET BY MOUTH EVERY DAY 09/14/21   Birdie Sons, MD  potassium chloride SA (KLOR-CON) 20 MEQ tablet Take 20 mEq by mouth daily. 08/31/20   [provider]  torsemide (DEMADEX) 20 MG tablet Take 40 mg by mouth daily. 09/04/20   [provider]  triamcinolone ointment (KENALOG) 0.1 % APPLY TOPICALLY TWICE DAILY AS DIRECTED 07/15/22   Birdie Sons, MD   I have personally, briefly reviewed patient's prior medical records in [place]  Objective: Blood pressure (!) 132/100, pulse 61, temperature (!) 97.2 F (36.2 C), temperature source Oral, resp. rate 16, SpO2 94 %.   Constitutional: NAD, calm, comfortable HEENT: lids and conjunctivae normal. MMM. Posterior pharynx clear of any exudate or lesions. Normal dentition.  Neck: normal, supple, no masses, no thyromegaly Respiratory: CTAB, no wheezing, no crackles. Normal respiratory effort. No accessory muscle use. Speaking in full sentences Cardiovascular: RRR, no murmurs / rubs / gallops. No extremity edema. 2+ pedal pulses. no clubbing / cyanosis.  Musculoskeletal: No joint deformity upper and lower extremities. Normal muscle tone.  Skin: dry,  intact, normal color, normal temperature on exposed skin Neurologic: Alert and oriented x 3. Normal speech. Grossly non-focal exam. PERRL Psychiatric: Normal mood. Congruent affect.  Labs on Admission: I have personally reviewed admission labs and imaging studies  CBC  Component Value Date/Time   WBC 5.0 10/30/2022 1540   RBC 4.10 (L) 10/30/2022 1540   HGB 13.2 10/30/2022 1540   HGB 13.2 10/15/2022 1014   HCT 42.2 10/30/2022 1540   HCT 39.8 10/15/2022 1014   PLT 127 (L) 10/30/2022 1540   PLT 158 10/15/2022 1014   MCV 102.9 (H) 10/30/2022 1540   MCV 97 10/15/2022 1014   MCH 32.2 10/30/2022 1540   MCHC 31.3 10/30/2022 1540   RDW 16.6 (H) 10/30/2022 1540   RDW 14.3 10/15/2022 1014   LYMPHSABS 1.4 10/30/2022 1540   LYMPHSABS 1.5 10/15/2022 1014   MONOABS 0.4 10/30/2022 1540   EOSABS 0.1 10/30/2022 1540   EOSABS 0.2 10/15/2022 1014   BASOSABS 0.1 10/30/2022 1540   BASOSABS 0.1 10/15/2022 1014   CMP     Component Value Date/Time   NA 138 10/30/2022 1540   NA 143 10/15/2022 1014   K 5.3 (H) 10/30/2022 1540   CL 107 10/30/2022 1540   CO2 22 10/30/2022 1540   GLUCOSE 130 (H) 10/30/2022 1540   BUN 80 (H) 10/30/2022 1540   BUN 51 (H) 10/15/2022 1014   CREATININE 4.28 (H) 10/30/2022 1540   CALCIUM 9.1 10/30/2022 1540   PROT 7.2 10/30/2022 1540   PROT 7.0 10/15/2022 1014   ALBUMIN 3.5 10/30/2022 1540   ALBUMIN 4.1 10/15/2022 1014   AST 48 (H) 10/30/2022 1540   ALT 28 10/30/2022 1540   ALKPHOS 114 10/30/2022 1540   BILITOT 2.1 (H) 10/30/2022 1540   BILITOT 1.3 (H) 10/15/2022 1014   GFRNONAA 13 (L) 10/30/2022 1540   GFRAA 60 11/21/2020 1505    Radiological Exams on Admission: DG Chest Portable 1 View  Result Date: 10/30/2022 CLINICAL DATA:  Shortness of breath, hypoxia EXAM: PORTABLE CHEST 1 VIEW COMPARISON:  Previous studies including the examination of 10/27/2022 FINDINGS: Transverse diameter of heart is increased. Central pulmonary vessels are prominent. There are no  signs of alveolar pulmonary edema. There is no focal pulmonary consolidation. There is no pleural effusion or pneumothorax. There is previous coronary bypass surgery. Pacemaker/defibrillator battery is seen in left infraclavicular region with tips of leads in right atrium and right ventricle. IMPRESSION: Cardiomegaly. Central pulmonary vessels are prominent suggesting mild CHF. No focal pulmonary consolidation is seen. There is no significant pleural effusion. Electronically Signed   By: Elmer Picker M.D.   On: 10/30/2022 16:10    EKG: Independently reviewed.   DVT prophylaxis: SCDs Start: 10/30/22 1646 apixaban (ELIQUIS) tablet 5 mg   Code Status: full  Family Communication: wife at bedside  Disposition Plan: admit to inpatient for further workup  Consults called: none   Richarda Osmond, DO [facility]  10/30/2022, 6:49 PM    To contact the appropriate [facility] Attending or Consulting provider: Check [email or web address] for coverage from 7pm-7am

## 2022-10-30 NOTE — Telephone Encounter (Signed)
  Chief Complaint: dizziness Symptoms: feeling swimmy headed, unable to walk straight and feeling increased weakness Frequency: today, dizziness has been ongoing for some time but getting worse Pertinent Negatives: NA Disposition: '[]'$ ED /'[]'$ Urgent Care (no appt availability in office) / '[x]'$ Appointment(In office/virtual)/ '[]'$  [facility]/ '[]'$ Home Care/ '[]'$ Refused Recommended Disposition /'[]'$ [place] [facility]/ '[]'$  Follow-up with PCP Additional Notes: scheduled OV today at 1400 with Daneil Dan, NP. Pt states feels constant swimmy headed even when sitting and took him over 1 hr to get out of chair and go make a sandwich. Pt has someone who can drive him to appt.   Reason for Disposition  [1] MODERATE dizziness (e.g., interferes with normal activities) AND [2] has NOT been evaluated by doctor (or NP/PA) for this  (Exception: Dizziness caused by heat exposure, sudden standing, or poor fluid intake.)  Answer Assessment - Initial Assessment Questions 1. DESCRIPTION: "Describe your dizziness."     Feeling swimmy headed 2. LIGHTHEADED: "Do you feel lightheaded?" (e.g., somewhat faint, woozy, weak upon standing)     yes 4. SEVERITY: "How bad is it?"  "Do you feel like you are going to faint?" "Can you stand and walk?"   - MILD: Feels slightly dizzy, but walking normally.   - MODERATE: Feels unsteady when walking, but not falling; interferes with normal activities (e.g., school, work).   - SEVERE: Unable to walk without falling, or requires assistance to walk without falling; feels like passing out now.      Moderate  5. ONSET:  "When did the dizziness begin?"     Ongoing for several  10. OTHER SYMPTOMS: "Do you have any other symptoms?" (e.g., fever, chest pain, vomiting, diarrhea, bleeding)       Weakness  Protocols used: Dizziness - Lightheadedness-A-AH

## 2022-10-30 NOTE — ED Notes (Signed)
Pt ambulated with O2sats 96-98% RA and had good waveform using nasal sensor

## 2022-10-30 NOTE — Assessment & Plan Note (Signed)
O2 at 80% on RA; [place] applied at 10 LPM. SPO2 to 91%; pallor improved No audible rhonchi or wheeze

## 2022-10-30 NOTE — Assessment & Plan Note (Signed)
Concern for pulm edema vs CHF exac with acute hypoxia in setting of recent URI 9-1-1 called; pt transferred via stretcher to local ER EKG confirms Vent rhythm with V pacing; HR at 61 bmp- with wide QRS and concern for LVH; abnormal per comparision from 10/28/22

## 2022-10-30 NOTE — ED Notes (Signed)
[employer] pacemaker interrogated °

## 2022-10-30 NOTE — ED Provider Notes (Addendum)
[facility] Provider Note    Event Date/Time   First MD Initiated Contact with Patient 10/30/22 1517     (approximate)   History   Shortness of Breath and Weakness (Dyspnea, SOB, and weakness since Sunday)   HPI  Taylor [name] is a 81 y.o. male who reports 3 days of shortness of breath with exertion.  He says he can walk about 40 feet and gets short of breath and has to sit down.  His breath comes back fairly quickly.  He also is having trouble laying flat for the last 3 nights.  He gets paroxysmal nocturnal dyspnea.  He is also been having some epigastric pain for the last 3 to 4 days.  This is reproduced by palpation.  It is not worse with exertion.  He does not have any discomfort or tightness or anything else except shortness of breath with exertion.      Physical Exam   Triage Vital Signs: ED Triage Vitals  Enc Vitals Group     BP 10/30/22 1515 (!) 134/96     Pulse Rate 10/30/22 1515 (!) 35     Resp 10/30/22 1515 17     Temp --      Temp src --      SpO2 10/30/22 1515 95 %     Weight --      Height --      Head Circumference --      Peak Flow --      Pain Score 10/30/22 1518 0     Pain Loc --      Pain Edu? --      Excl. in [place]? --     Most recent vital signs: Vitals:   10/30/22 1550 10/30/22 1600  BP:  (!) 132/100  Pulse:  61  Resp:  16  Temp: (!) 97.2 F (36.2 C)   SpO2:  94%     General: Awake, no distress.  CV:  Good peripheral perfusion.  Heart regular rate and rhythm no audible murmurs Resp:  Normal effort.  Lungs are clear Abd:  No distention.  Soft there is some tenderness in the epigastric area to palpation and reproduces his pain Extremities: Minimal trace edema   ED Results / Procedures / Treatments   Labs (all labs ordered are listed, but only abnormal results are displayed) Labs Reviewed  BRAIN NATRIURETIC PEPTIDE - Abnormal; Notable for the following components:      Result Value   B Natriuretic Peptide  554.5 (*)    All other components within normal limits  COMPREHENSIVE METABOLIC PANEL - Abnormal; Notable for the following components:   Potassium 5.3 (*)    Glucose, Bld 130 (*)    BUN 80 (*)    Creatinine, Ser 4.28 (*)    AST 48 (*)    Total Bilirubin 2.1 (*)    GFR, Estimated 13 (*)    All other components within normal limits  LACTIC ACID, PLASMA - Abnormal; Notable for the following components:   Lactic Acid, Venous 2.8 (*)    All other components within normal limits  CBC WITH DIFFERENTIAL/PLATELET - Abnormal; Notable for the following components:   RBC 4.10 (*)    MCV 102.9 (*)    RDW 16.6 (*)    Platelets 127 (*)    All other components within normal limits  D-DIMER, QUANTITATIVE - Abnormal; Notable for the following components:   D-Dimer, Quant 2.52 (*)    All other  components within normal limits  TROPONIN I (HIGH SENSITIVITY) - Abnormal; Notable for the following components:   Troponin I (High Sensitivity) 30 (*)    All other components within normal limits  LACTIC ACID, PLASMA     EKG  The read interpreted by me shows a fully paced rhythm rate of 61 left axis right bundle branch block looks similar to EKG from last several days.   RADIOLOGY Chest x-ray read by me and interpreted by me and reading confirmed by radiology does show an enlarged heart with some mild CHF   PROCEDURES:  Critical Care performed: Critical care time 15 minutes this includes reviewing the patient's old records talking to the patient reviewing his studies and previous studies and comparing them especially of the EKGs and x-rays.  Additionally I am talking to the hospitalist.  Procedures   MEDICATIONS ORDERED IN ED: Medications  alum & mag hydroxide-simeth (MAALOX/MYLANTA) 200-200-20 MG/5ML suspension 30 mL (30 mLs Oral Given 10/30/22 1549)     IMPRESSION / MDM / ASSESSMENT AND PLAN / ED COURSE  I reviewed the triage vital signs and the nursing notes. Patient with gradually  increasing BNP now 500.  He also has AKI with his GFR going from 21-13 in the last 3 days.  He remains short of breath with exertion and EMS documented O2 sats in the 80s with exertion.  His D-dimer is elevated.  We will have to get a VQ scan I ordered that.  Differential diagnosis includes, but is not limited to, PE, CHF, anginal equivalent all these are probably equal probability.  He also has AKI which may be from dehydration.  Patient's presentation is most consistent with acute presentation with potential threat to life or bodily function.  The patient is on the cardiac monitor to evaluate for evidence of arrhythmia and/or significant heart rate changes.  Rhythm has been seen {   FINAL CLINICAL IMPRESSION(S) / ED DIAGNOSES   Final diagnoses:  Dyspnea, unspecified type  Hypoxia  AKI (acute kidney injury) ([place])  Elevated d-dimer     Rx / DC Orders   ED Discharge Orders     None        Note:  This document was prepared using Dragon voice recognition software and may include unintentional dictation errors.   Nena Polio, MD 10/30/22 1628 ----------------------------------------- 4:30 PM on 10/30/2022 ----------------------------------------- I have ordered a VQ scan for him since we cannot do a CT with his renal function.  I will talk to the hospitalist and likely start some slow IV hydration to see if we can work on his kidney function and get it improved.  Have to be careful as he does have symptoms consistent with CHF although he could also be having an anginal equivalent.   Nena Polio, MD 10/30/22 1630

## 2022-10-30 NOTE — Patient Instructions (Signed)
EMS called to assist in getting patient safely to hospital given 10L O2 requirement hypoxia

## 2022-10-30 NOTE — ED Notes (Addendum)
Pt nasal O2 sensor reads between 97-100%. Pt wears CPAP at night, this RN placed pt on 4L of o2 so pt could sleep

## 2022-10-30 NOTE — ED Notes (Signed)
Pt resting in bed with O2sats in high 90s with good waveform and RA. Pt ambulated and could not achieve adequate waveform. Best appearing waveform showed 90-92% with ambulation RA

## 2022-10-30 NOTE — Assessment & Plan Note (Signed)
Concern for pulm edema vs CHF exac with acute hypoxia in setting of recent URI 9-1-1 called; pt transferred via stretcher to local ER

## 2022-10-30 NOTE — ED Triage Notes (Signed)
Pt went to PCP on Sunday and had a follow-up today. At doctors office his O2sat was in the 80's. EMS called and found O2sat in 98% with drops to the 80's with talking or activity. Pace maker was checked two months ago without any complications.

## 2022-10-30 NOTE — Progress Notes (Signed)
Established patient visit   Patient: Taylor [name]   DOB: 08/28/1941   81 y.o. Male  MRN: [id number] Visit Date: 10/30/2022  Today's healthcare provider: Gwyneth Sprout, FNP Introduced to nurse practitioner role and practice setting.   I,Tiffany J Bragg,acting as a scribe for Gwyneth Sprout, FNP.,have documented all relevant documentation on the behalf of Gwyneth Sprout, FNP,as directed by  Gwyneth Sprout, FNP while in the presence of Gwyneth Sprout, FNP.  Subjective    HPI HPI     Fatigue    Additional comments: Patient complains of weakness, swimmy headed, unsteady gait starting a while ago but worsening this week. Sore throat starting Monday.       Last edited by Smitty Knudsen, CMA on 10/30/2022  2:13 PM.       Patient seen earlier this week now with worsening SOB/DOE and weakness. BNP had improved; however, recommendation for nephrology follow up given acute on chronic concerns from PCP.   Medications: Outpatient Medications Prior to Visit  Medication Sig   allopurinol (ZYLOPRIM) 300 MG tablet Take 0.5 tablets (150 mg total) by mouth daily.   apixaban (ELIQUIS) 5 MG TABS tablet Take 1 tablet (5 mg total) by mouth 2 (two) times daily.   aspirin 81 MG EC tablet Take 81 mg by mouth daily.    atorvastatin (LIPITOR) 40 MG tablet TAKE 1 TABLET(40 MG) BY MOUTH DAILY   Calcium Carbonate Antacid 600 MG chewable tablet Chew 1 tablet by mouth daily.   carvedilol (COREG) 6.25 MG tablet Take 1 tablet (6.25 mg total) by mouth 2 (two) times daily with a meal.   Cyanocobalamin (B-12) 2500 MCG TABS Take 2,500 mcg by mouth daily.   doxycycline (VIBRA-TABS) 100 MG tablet Take 1 tablet (100 mg total) by mouth 2 (two) times daily for 7 days.   GLUCOSAMINE HCL PO Take 1,500 mg by mouth daily.   glucose blood (ONETOUCH VERIO) test strip Use as instructed to check sugar daily   losartan (COZAAR) 50 MG tablet TAKE 1/2 TABLET(25 MG) BY MOUTH DAILY   magnesium oxide (MAG-OX) 400 MG tablet Take  500 mg by mouth 2 (two) times daily.   metFORMIN (GLUCOPHAGE-XR) 500 MG 24 hr tablet TAKE 2 TABLETS(1000 MG) BY MOUTH DAILY   pantoprazole (PROTONIX) 40 MG tablet TAKE 1 TABLET BY MOUTH EVERY DAY   potassium chloride SA (KLOR-CON) 20 MEQ tablet Take 20 mEq by mouth daily.   torsemide (DEMADEX) 20 MG tablet Take 40 mg by mouth daily.   triamcinolone ointment (KENALOG) 0.1 % APPLY TOPICALLY TWICE DAILY AS DIRECTED   No facility-administered medications prior to visit.    Review of Systems  Last CBC Lab Results  Component Value Date   WBC 6.2 10/27/2022   HGB 12.4 (L) 10/27/2022   HCT 38.6 (L) 10/27/2022   MCV 100.3 (H) 10/27/2022   MCH 32.2 10/27/2022   RDW 16.1 (H) 10/27/2022   PLT 137 (L) 74/94/4967   Last metabolic panel Lab Results  Component Value Date   GLUCOSE 173 (H) 10/27/2022   NA 139 10/27/2022   K 3.6 10/27/2022   CL 105 10/27/2022   CO2 23 10/27/2022   BUN 57 (H) 10/27/2022   CREATININE 2.93 (H) 10/27/2022   GFRNONAA 21 (L) 10/27/2022   CALCIUM 9.0 10/27/2022   PHOS 3.6 02/23/2021   PROT 7.2 10/27/2022   ALBUMIN 3.5 10/27/2022   LABGLOB 2.9 10/15/2022   AGRATIO 1.4 10/15/2022   BILITOT  1.7 (H) 10/27/2022   ALKPHOS 131 (H) 10/27/2022   AST 24 10/27/2022   ALT 17 10/27/2022   ANIONGAP 11 10/27/2022   Last lipids Lab Results  Component Value Date   CHOL 112 12/11/2021   HDL 38 (L) 12/11/2021   LDLCALC 54 12/11/2021   TRIG 107 12/11/2021   CHOLHDL 2.9 12/11/2021   Last hemoglobin A1c Lab Results  Component Value Date   HGBA1C 6.5 (H) 08/06/2022   Last thyroid functions Lab Results  Component Value Date   TSH 2.010 03/26/2022   Last vitamin D Lab Results  Component Value Date   VD25OH 46.58 04/19/2020   Last vitamin B12 and Folate Lab Results  Component Value Date   VITAMINB12 1,141 02/09/2020       Objective    There were no vitals taken for this visit. BP Readings from Last 3 Encounters:  10/28/22 107/75  10/27/22 114/80   10/21/22 127/80   Wt Readings from Last 3 Encounters:  10/28/22 188 lb (85.3 kg)  10/27/22 178 lb (80.7 kg)  10/21/22 186 lb (84.4 kg)   SpO2 Readings from Last 3 Encounters:  10/28/22 94%  10/27/22 91%  10/21/22 100%      Physical Exam Constitutional:      General: He is in acute distress.     Appearance: He is ill-appearing. He is not toxic-appearing or diaphoretic.  HENT:     Ears:     Comments: HOH with room noise     Nose: Congestion present.  Cardiovascular:     Rate and Rhythm: Normal rate.     Comments: Vpaced per EKG Pulmonary:     Effort: Respiratory distress present.  Skin:    General: Skin is warm and dry.     Coloration: Skin is pale.  Psychiatric:        Mood and Affect: Mood normal.        Behavior: Behavior normal.        Thought Content: Thought content normal.        Judgment: Judgment normal.    Limited exam  Results for orders placed or performed in visit on 10/30/22  POC COVID-19  Result Value Ref Range   SARS Coronavirus 2 Ag Negative Negative  POCT Influenza A/B  Result Value Ref Range   Influenza A, POC Negative Negative   Influenza B, POC Negative Negative    Assessment & Plan     Problem List Items Addressed This Visit       Cardiovascular and Mediastinum   Acute on chronic congestive heart failure (HCC)    Concern for pulm edema vs CHF exac with acute hypoxia in setting of recent URI 9-1-1 called; pt transferred via stretcher to local ER        Respiratory   Acute pulmonary edema ([place]) - Primary    Concern for pulm edema vs CHF exac with acute hypoxia in setting of recent URI 9-1-1 called; pt transferred via stretcher to local ER      Hypoxia    O2 at 80% on RA; [place] applied at 10 LPM. SPO2 to 91%; pallor improved No audible rhonchi or wheeze       Relevant Orders   POC COVID-19 (Completed)   POCT Influenza A/B (Completed)   EKG 12-Lead     Other   DOE (dyspnea on exertion)    Concern for pulm edema vs CHF exac  with acute hypoxia in setting of recent URI 9-1-1 called; pt transferred via stretcher  to local ER      Relevant Orders   POC COVID-19 (Completed)   POCT Influenza A/B (Completed)   Weakness    Concern for pulm edema vs CHF exac with acute hypoxia in setting of recent URI 9-1-1 called; pt transferred via stretcher to local ER      Relevant Orders   EKG 12-Lead   Return if symptoms worsen or fail to improve.     Vonna Kotyk, FNP, have reviewed all documentation for this visit. The documentation on 10/30/22 for the exam, diagnosis, procedures, and orders are all accurate and complete.  Gwyneth Sprout, [place] [phone] (phone) [phone] (fax)  [place]

## 2022-10-30 NOTE — Assessment & Plan Note (Signed)
>>  ASSESSMENT AND PLAN FOR ACUTE ON CHRONIC CONGESTIVE HEART FAILURE ([place]) WRITTEN ON 10/30/2022  2:39 PM BY PAYNE, ELISE T, FNP  Concern for pulm edema vs CHF exac with acute hypoxia in setting of recent URI 9-1-1 called; pt transferred via stretcher to local ER

## 2022-10-31 ENCOUNTER — Encounter: Payer: Self-pay | Admitting: Student in an Organized Health Care Education/Training Program

## 2022-10-31 ENCOUNTER — Inpatient Hospital Stay: Payer: Medicare Other

## 2022-10-31 DIAGNOSIS — J9621 Acute and chronic respiratory failure with hypoxia: Secondary | ICD-10-CM

## 2022-10-31 DIAGNOSIS — R0600 Dyspnea, unspecified: Secondary | ICD-10-CM | POA: Diagnosis not present

## 2022-10-31 DIAGNOSIS — I5020 Unspecified systolic (congestive) heart failure: Secondary | ICD-10-CM

## 2022-10-31 DIAGNOSIS — I340 Nonrheumatic mitral (valve) insufficiency: Secondary | ICD-10-CM

## 2022-10-31 DIAGNOSIS — R0902 Hypoxemia: Secondary | ICD-10-CM | POA: Diagnosis not present

## 2022-10-31 DIAGNOSIS — N179 Acute kidney failure, unspecified: Secondary | ICD-10-CM | POA: Diagnosis not present

## 2022-10-31 LAB — BASIC METABOLIC PANEL
Anion gap: 8 (ref 5–15)
Anion gap: 11 (ref 5–15)
BUN: 91 mg/dL — ABNORMAL HIGH (ref 8–23)
BUN: 89 mg/dL — ABNORMAL HIGH (ref 8–23)
CO2: 24 mmol/L (ref 22–32)
CO2: 22 mmol/L (ref 22–32)
Calcium: 8.9 mg/dL (ref 8.9–10.3)
Calcium: 8.6 mg/dL — ABNORMAL LOW (ref 8.9–10.3)
Chloride: 108 mmol/L (ref 98–111)
Chloride: 104 mmol/L (ref 98–111)
Creatinine, Ser: 4.83 mg/dL — ABNORMAL HIGH (ref 0.61–1.24)
Creatinine, Ser: 4.55 mg/dL — ABNORMAL HIGH (ref 0.61–1.24)
GFR, Estimated: 11 mL/min — ABNORMAL LOW (ref >60)
GFR, Estimated: 12 mL/min — ABNORMAL LOW (ref >60)
Glucose, Bld: 103 mg/dL — ABNORMAL HIGH (ref 70–99)
Glucose, Bld: 120 mg/dL — ABNORMAL HIGH (ref 70–99)
Potassium: 5.6 mmol/L — ABNORMAL HIGH (ref 3.5–5.1)
Potassium: 4.4 mmol/L (ref 3.5–5.1)
Sodium: 140 mmol/L (ref 135–145)
Sodium: 137 mmol/L (ref 135–145)

## 2022-10-31 LAB — ECHOCARDIOGRAM COMPLETE
AR max vel: 1.42 cm2
AV Area VTI: 1.45 cm2
AV Area mean vel: 1.25 cm2
AV Mean grad: 4.8 mmHg
AV Peak grad: 8.6 mmHg
Ao pk vel: 1.46 m/s
Area-P 1/2: 5.81 cm2
S' Lateral: 5 cm
Weight: 3024 oz

## 2022-10-31 LAB — CBC
HCT: 38.9 % — ABNORMAL LOW (ref 39.0–52.0)
Hemoglobin: 12.4 g/dL — ABNORMAL LOW (ref 13.0–17.0)
MCH: 32.9 pg (ref 26.0–34.0)
MCHC: 31.9 g/dL (ref 30.0–36.0)
MCV: 103.2 fL — ABNORMAL HIGH (ref 80.0–100.0)
Platelets: 125 10*3/uL — ABNORMAL LOW (ref 150–400)
RBC: 3.77 MIL/uL — ABNORMAL LOW (ref 4.22–5.81)
RDW: 16.2 % — ABNORMAL HIGH (ref 11.5–15.5)
WBC: 6.1 10*3/uL (ref 4.0–10.5)
nRBC: 0 % (ref 0.0–0.2)

## 2022-10-31 MED ORDER — TECHNETIUM TO 99M ALBUMIN AGGREGATED
3.6100 | Freq: Once | INTRAVENOUS | Status: AC | PRN
  Administered 2022-10-31: 3.61 via INTRAVENOUS

## 2022-10-31 MED ORDER — SODIUM ZIRCONIUM CYCLOSILICATE 5 G PO PACK
5.0000 g | PACK | Freq: Once | ORAL | Status: AC
  Administered 2022-10-31: 5 g via ORAL
  Filled 2022-10-31: qty 1

## 2022-10-31 MED ORDER — ALBUTEROL SULFATE (2.5 MG/3ML) 0.083% IN NEBU
2.5000 mg | INHALATION_SOLUTION | Freq: Once | RESPIRATORY_TRACT | Status: AC
  Administered 2022-10-31: 2.5 mg via RESPIRATORY_TRACT
  Filled 2022-10-31: qty 3

## 2022-10-31 MED ORDER — PANTOPRAZOLE SODIUM 40 MG PO TBEC
40.0000 mg | DELAYED_RELEASE_TABLET | Freq: Every day | ORAL | Status: DC
  Administered 2022-10-31 – 2022-11-02 (×3): 40 mg via ORAL
  Filled 2022-10-31 (×3): qty 1

## 2022-10-31 MED ORDER — SODIUM CHLORIDE 0.9 % IV SOLN: INTRAVENOUS | Status: DC

## 2022-10-31 NOTE — TOC Initial Note (Signed)
Transition of Care [facility]) - Initial/Assessment Note    Patient Details  Name: Taylor [name] MRN: [id number] Date of Birth: 07/18/1941  [facility]) CM/SW Contact:    Shelbie Hutching, RN Phone Number: 10/31/2022, 11:26 AM  Clinical Narrative:                 Patient admitted to the hospital with CHF.  RNCM met with patient at the bedside.  Patient is from home with wife.  Independent in ADL's, requires not assisted devices and drives.  No TOC needs identified.   Expected Discharge Plan: Home/Self Care Barriers to Discharge: Continued Medical Work up   Patient Goals and CMS Choice Patient states their goals for this hospitalization and ongoing recovery are:: Be well and get back home      Expected Discharge Plan and Services Expected Discharge Plan: Home/Self Care   Discharge Planning Services: CM Consult   Living arrangements for the past 2 months: Single Family Home                 DME Arranged: N/A DME Agency: NA       HH Arranged: NA HH Agency: NA        Prior Living Arrangements/Services Living arrangements for the past 2 months: Single Family Home Lives with:: Spouse Patient language and need for interpreter reviewed:: Yes Do you feel safe going back to the place where you live?: Yes      Need for Family Participation in Patient Care: Yes (Comment) Care giver support system in place?: Yes (comment) Current home services: DME (cane and walker but doesn't use them) Criminal Activity/Legal Involvement Pertinent to Current Situation/Hospitalization: No - Comment as needed  Activities of Daily Living      Permission Sought/Granted Permission sought to share information with : Family Supports    Share Information with NAME: Ilya Neely     Permission granted to share info w Relationship: spouse  Permission granted to share info w Contact Information: [phone]  Emotional Assessment Appearance:: Appears stated  age Attitude/Demeanor/Rapport: Engaged Affect (typically observed): Accepting Orientation: : Oriented to Self, Oriented to Place, Oriented to  Time, Oriented to Situation Alcohol / Substance Use: Not Applicable Psych Involvement: No (comment)  Admission diagnosis:  Congestive heart failure (CHF) (HCC) [I50.9] Patient Active Problem List   Diagnosis Date Noted   Weakness 10/30/2022   Acute on chronic congestive heart failure ([place]) 10/30/2022   Acute pulmonary edema ([place]) 10/30/2022   DOE (dyspnea on exertion) 10/30/2022   Hypoxia 10/30/2022   Congestive heart failure (CHF) ([place]) 10/30/2022   Elevated d-dimer 10/30/2022   Abdominal aortic aneurysm (AAA) ([place]) 10/30/2022   AKI (acute kidney injury) ([place]) 10/30/2022   Dyspnea 10/30/2022   Orthopnea 10/15/2022   Acute on chronic systolic congestive heart failure ([place]) 10/15/2022   Dizziness on standing 08/06/2022   Carotid stenosis, symptomatic w/o infarct, left 07/24/2022   Symptomatic carotid artery narrowing without infarction, left 07/05/2022   Dizziness 03/26/2022   Atrophy of kidney 09/27/2021   COVID-19 09/27/2021   Heart failure, unspecified ([place]) 08/22/2021   Other abnormal findings in urine 08/22/2021   Other specified complication of vascular prosthetic devices, implants and grafts, initial encounter ([place]) 08/22/2021   Atherosclerotic heart disease of native coronary artery with unspecified angina pectoris ([place]) 08/22/2021   Implantable cardioverter-defibrillator (ICD) in situ 08/22/2021   Stage 3b chronic kidney disease ([place]) 08/22/2021   Type 2 diabetes mellitus with diabetic chronic kidney disease ([place]) 08/22/2021  Hypertension 08/22/2021   Idiopathic gout, unspecified site 08/22/2021   Pure hypercholesterolemia, unspecified 08/22/2021   Other psoriasis 08/22/2021   Aortic atherosclerosis ([place]) 05/08/2021   Abdominal pain 03/01/2021   Diabetes mellitus without complication ([place]) 94/76/5465   Diarrhea 09/12/2020    Acute kidney injury superimposed on CKD ([place]) 08/10/2020   Hypomagnesemia 04/26/2020   Hypocalcemia 04/19/2020   Numbness and tingling in both hands 04/19/2020   Atrial fibrillation ([place]) 04/19/2020   Other specified interstitial pulmonary diseases ([place]) 12/06/2019   Abdominal pain    Pancreatitis 11/22/2019   CAP (community acquired pneumonia) 11/13/2019   Hyperuricemia 03/31/2019   Numbness 05/07/2018   Benign colonic polyp 03/24/2017   Tibialis anterior tenosynovitis 12/21/2015   Pleural nodule 07/27/2015   Psoriatic arthritis ([place]) 05/12/2015   Restrictive lung disease 05/12/2015   Barrett esophagus 03/30/2015   Ache in joint 03/30/2015   Bradycardia 03/30/2015   CAD S/P percutaneous coronary angioplasty 03/30/2015   Cardiac defibrillator in place 03/30/2015   Chronic kidney disease (CKD), stage III (moderate) ([place]) 03/30/2015   Cluster headache syndrome 03/30/2015   Degeneration of lumbar or lumbosacral intervertebral disc 03/30/2015   Gout 03/30/2015   Personal history of malignant neoplasm of bladder 03/30/2015   Hypercholesteremia 03/30/2015   LBP (low back pain) 03/30/2015   Personal history of malignant melanoma of skin 03/30/2015   Muscle ache 03/30/2015   Arthritis of knee, degenerative 03/30/2015   Psoriasis 03/30/2015   Obstructive sleep apnea 03/30/2015   Chronic venous insufficiency 03/30/2015   Low back pain 03/30/2015   History of abdominal aortic aneurysm (AAA) 06/24/2013   History of CVA (cerebrovascular accident) 06/24/2013   Cardiomyopathy, ischemic 06/24/2013   History of AAA (abdominal aortic aneurysm) repair 06/24/2013   Essential (primary) hypertension 08/17/2011   Arrhythmia, sinus node 08/17/2011   History of ventricular fibrillation 03/54/6568   Systolic CHF with reduced left ventricular function, NYHA class 3 ([place]) 02/26/2010   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 04/27/2007   Cardiac pacemaker in situ 10/10/2006   PCP:   Birdie Sons, MD Pharmacy:   [facility] [place], [place] AT [facility] OF [address] [place] [address] Phone: [phone] Fax: [phone]     Social Determinants of Health (SDOH) Interventions    Readmission Risk Interventions     No data to display

## 2022-10-31 NOTE — Progress Notes (Signed)
PROGRESS NOTE  Taylor [name]    DOB: 09/29/1941, 81 y.o.  QIH:[id number]    Code Status: Full Code   DOA: 10/30/2022   LOS: 1   Brief hospital course  Taylor [name] is a 81 y.o. male with a PMH significant for HFrEF, Barrett esophagus, CAD s/p stent placement, s/p AICD, CKDIII, DDD, gout, bladder cancer, HLD, pacemaker placed, psoriasis, AAA, h/o CVA, HTN, ischemic cardiomyopathy, h/o ventricular fibrillation, restrictive lung disease, pleural nodule, h/o pancreatitis, Afib (on eliquis), Type II DM, carotid stenosis. At baseline, they live at home with their wife and are independent with ADLs.   They presented from home to the ED on 10/30/2022 with DOE and orthopnea x 7 days. It acutely worsened today and he is unable to lay flat.  - He had been seen in ED 12/3 for same and prescribed doxycycline for sinusitis.  - He had been evaluated by his primary care doctor on 12/4 and diagnosed with a URVI and they increased his torsemide to TID.    Upon arrival to ED, he states that he has difficulty ambulating across the room due to SOB which is much worse than his baseline. He has only mild symptoms while resting but is unable to lay flat as this causes his breathing to acutely worsen. States he has been taking his torsemide '20mg'$  TID as directed since seeing his PCP. Denies any LE swelling.  Denies cough, chest pain, diaphoresis, syncope. Denies any changes in his bowel or urine habits. Did not notice increased urine output with his increase frequency of torsemide.   In the ED, it was found that they had stable vital signs ORA. Labs showed negative covid or flu. BNP elevated to 554.5 which is above his baseline. Troponin 30 which is about his baseline. K+ 5.3, LA 2.8, CBC at baseline,  ECG shows possible RBBB not seen previously. Paced rhythm. No ST changes. Significant findings included: Of note, Cr was 4.28 from a baseline around 2.5, D-dimer 2.52 Chest xray showed cardiomegaly and central vascular  congestion.  VQ scan pending.   They were initially treated with IV lasix '60mg'$ .    Patient was admitted to medicine service for further workup and management of SOB and AKI as outlined in detail below.  10/31/22 -Cr worsened with diuretics so discontinued and put on gentle fluids in setting of HF. Nephrology consulted for AKI on CKDIIIb.  Echo resulted today showing severe valvular disease not shown on prior study 2021. Cardiology consulted to evaluate for possible replacement.  Assessment & Plan  Principal Problem:   Congestive heart failure (CHF) (HCC) Active Problems:   CAD S/P percutaneous coronary angioplasty   Elevated d-dimer   Abdominal aortic aneurysm (AAA) (HCC)   AKI (acute kidney injury) ([place])   Dyspnea  SOB- clinically, patient feels improved. Desats to 80s while supine so put on 2L O2. Dry on exam and had Cr bump so stopped diuretics.  V/Q scan negative PE.  Based on echo results, likely cause of recurrent/persistent dyspnea is poor heart function including severe valvular disease - repeat troponin, ordered echo, consider cardiology consult - treat fluid overload with increased dose IV lasix '60mg'$ . If this causes Cr to increase, will stop and trial IV fluid hydration instead - follow up VQ scan ordered by EDP - strict I/O, daily weights - continuous cardiac monitoring and pulse oximetry - repeat LA - recommend PFTs   AKI on CKD 3b- Cr elevated to 4.28>>4.83 on presentation from baseline around 2.5 s/p  diuretics. Stopped diuretics, started very low amount of IV fluids and consulted nephrology, appreciate recs.    K+ 5.3>5.6 - BMP am - monitor and replete electrolytes PRN - hold home K+ for now - lokelma and albuterol x1. Recheck BMP this afternoon - nephrology consult as Cr continues to increase. Will hold diuresis now and trial small amount of IV fluids.    Type II DM- non insulin dependent - monitor blood sugars, add on sSSI if needed - holding home metformin    CAD s/p PCI  AAA  HFrEF  HTN  Afib-EF from 07/2020 was 35-40%. Repeat echo this admission showing E <20%, severe LV decreased function with global hypokinesis. RV normal. Severely dilated Left atrium, severe mitral valve regurgitation. Tricuspid regurg mod-severe. Aortic valve read as normal.  - continuous cardiac monitoring - continue home medications including eliquis, atorvastatin, carvedilol, losartan - cardiology consulted, appreciate recs   H/o CVA - continue ASA   Gout - continue home meds including allopurinol   Holding home nutritional supplements  There is no height or weight on file to calculate BMI.  VTE ppx: SCDs Start: 10/30/22 1646 apixaban (ELIQUIS) tablet 5 mg   Diet:     Diet   Diet Heart Room service appropriate? Yes; Fluid consistency: Thin   Consultants: Nephrology Cardiology   Subjective 10/31/22    Pt reports feeling improved with respiratory status. He can lay flat for short periods without dyspnea. Denies chest pain or LE edema   Objective   Vitals:   10/31/22 0630 10/31/22 0700 10/31/22 0715 10/31/22 0730  BP: (!) 124/98 (!) 120/92  (!) 132/96  Pulse: 63 62 64 67  Resp: 18     Temp:      TempSrc:      SpO2: 93% (!) 89% 94% 92%   No intake or output data in the 24 hours ending 10/31/22 0834 There were no vitals filed for this visit.   Physical Exam:  General: awake, alert, NAD HEENT: atraumatic, clear conjunctiva, anicteric sclera, MMM, hard of hearing Respiratory: normal respiratory effort. CTAB Cardiovascular: quick capillary refill, normal S1/S2, RRR, no JVD, murmurs Nervous: A&O x3. no gross focal neurologic deficits, normal speech Extremities: moves all equally, no edema, normal tone Skin: dry, intact, normal temperature, normal color. No rashes, lesions or ulcers on exposed skin Psychiatry: normal mood, congruent affect  Labs   I have personally reviewed the following labs and imaging studies CBC    Component Value  Date/Time   WBC 6.1 10/31/2022 0419   RBC 3.77 (L) 10/31/2022 0419   HGB 12.4 (L) 10/31/2022 0419   HGB 13.2 10/15/2022 1014   HCT 38.9 (L) 10/31/2022 0419   HCT 39.8 10/15/2022 1014   PLT 125 (L) 10/31/2022 0419   PLT 158 10/15/2022 1014   MCV 103.2 (H) 10/31/2022 0419   MCV 97 10/15/2022 1014   MCH 32.9 10/31/2022 0419   MCHC 31.9 10/31/2022 0419   RDW 16.2 (H) 10/31/2022 0419   RDW 14.3 10/15/2022 1014   LYMPHSABS 1.4 10/30/2022 1540   LYMPHSABS 1.5 10/15/2022 1014   MONOABS 0.4 10/30/2022 1540   EOSABS 0.1 10/30/2022 1540   EOSABS 0.2 10/15/2022 1014   BASOSABS 0.1 10/30/2022 1540   BASOSABS 0.1 10/15/2022 1014      Latest Ref Rng & Units 10/31/2022    4:19 AM 10/30/2022    6:44 PM 10/30/2022    3:40 PM  BMP  Glucose 70 - 99 mg/dL 103   130   BUN  8 - 23 mg/dL 91   80   Creatinine 0.61 - 1.24 mg/dL 4.83  4.37  4.28   Sodium 135 - 145 mmol/L 140   138   Potassium 3.5 - 5.1 mmol/L 5.6   5.3   Chloride 98 - 111 mmol/L 108   107   CO2 22 - 32 mmol/L 24   22   Calcium 8.9 - 10.3 mg/dL 8.9   9.1     DG Chest Portable 1 View  Result Date: 10/30/2022 CLINICAL DATA:  Shortness of breath, hypoxia EXAM: PORTABLE CHEST 1 VIEW COMPARISON:  Previous studies including the examination of 10/27/2022 FINDINGS: Transverse diameter of heart is increased. Central pulmonary vessels are prominent. There are no signs of alveolar pulmonary edema. There is no focal pulmonary consolidation. There is no pleural effusion or pneumothorax. There is previous coronary bypass surgery. Pacemaker/defibrillator battery is seen in left infraclavicular region with tips of leads in right atrium and right ventricle. IMPRESSION: Cardiomegaly. Central pulmonary vessels are prominent suggesting mild CHF. No focal pulmonary consolidation is seen. There is no significant pleural effusion. Electronically Signed   By: Elmer Picker M.D.   On: 10/30/2022 16:10    Disposition Plan & Communication  Patient status:  Inpatient  Admitted From: Home Planned disposition location: Home Anticipated discharge date: 12/9 pending cardiac clearance and renal improvement  Family Communication: none    Author: Richarda Osmond, DO [facility] 10/31/2022, 8:34 AM   Available by Epic secure chat 7AM-7PM. If 7PM-7AM, please contact night-coverage.  [facility] contact information found on [email or web address].

## 2022-10-31 NOTE — ED Notes (Signed)
Informed RN bed assigned 

## 2022-10-31 NOTE — Evaluation (Signed)
Physical Therapy Evaluation Patient Details Name: Taylor [name] MRN: [id number] DOB: 29-Mar-1941 Today's Date: 10/31/2022  History of Present Illness  Taylor [name] is a 81 y.o. male with a PMH significant for HFrEF, Barrett esophagus, CAD s/p stent placement, s/p AICD, CKDIII, DDD, gout, bladder cancer, HLD, pacemaker placed, psoriasis, AAA, h/o CVA, HTN, ischemic cardiomyopathy, h/o ventricular fibrillation, restrictive lung disease, pleural nodule, h/o pancreatitis, Afib (on eliquis), Type II DM, carotid stenosis.  At baseline, he lives at home with his wife and is independent with ADLs. He presented from home to the ED on 10/30/2022 with DOE and orthopnea x 7 days. It acutely worsened today and he is unable to lay flat.   - He had been seen in ED 12/3 for same and prescribed doxycycline for sinusitis.   - He had been evaluated by his primary care doctor on 12/4 and diagnosed with a URVI and they increased his torsemide to TID.   Clinical Impression  Patient received on stretcher, he is agreeable to PT assessment. Patient independent with bed mobility, transfers and supervision for ambulation due to O2 monitoring and needs. Patient will continue to benefit from skilled PT while here to improve activity tolerance and O2 management for safe return home.        Recommendations for follow up therapy are one component of a multi-disciplinary discharge planning process, led by the attending physician.  Recommendations may be updated based on patient status, additional functional criteria and insurance authorization.  Follow Up Recommendations No PT follow up      Assistance Recommended at Discharge PRN  Patient can return home with the following  A little help with walking and/or transfers;A little help with bathing/dressing/bathroom;Help with stairs or ramp for entrance;Assistance with cooking/housework    Equipment Recommendations None recommended by PT  Recommendations for Other Services        Functional Status Assessment Patient has had a recent decline in their functional status and demonstrates the ability to make significant improvements in function in a reasonable and predictable amount of time.     Precautions / Restrictions Precautions Precautions: None Restrictions Weight Bearing Restrictions: No      Mobility  Bed Mobility Overal bed mobility: Independent                  Transfers Overall transfer level: Independent                      Ambulation/Gait Ambulation/Gait assistance: Supervision Gait Distance (Feet): 40 Feet Assistive device: None Gait Pattern/deviations: Step-through pattern Gait velocity: WNL     General Gait Details: O2 dropped to 86% on room air with ambulation. Patient placed back on O2 via [place] at 3 liters with sats at 96%  Stairs            Wheelchair Mobility    Modified Rankin (Stroke Patients Only)       Balance Overall balance assessment: No apparent balance deficits (not formally assessed)                                           Pertinent Vitals/Pain Pain Assessment Pain Assessment: No/denies pain    Home Living Family/patient expects to be discharged to:: Private residence Living Arrangements: Spouse/significant other Available Help at Discharge: Family;Other (Comment) Type of Home: House Home Access: Stairs to enter Entrance Stairs-Rails: Right;Left;Can reach both  Entrance Stairs-Number of Steps: 3   Home Layout: One level Home Equipment: None      Prior Function Prior Level of Function : Independent/Modified Independent;Driving             Mobility Comments: independent ADLs Comments: independent     Hand Dominance   Dominant Hand: Right    Extremity/Trunk Assessment   Upper Extremity Assessment Upper Extremity Assessment: Overall WFL for tasks assessed    Lower Extremity Assessment Lower Extremity Assessment: Overall WFL for tasks assessed     Cervical / Trunk Assessment Cervical / Trunk Assessment: Normal  Communication   Communication: No difficulties  Cognition Arousal/Alertness: Awake/alert Behavior During Therapy: WFL for tasks assessed/performed Overall Cognitive Status: Within Functional Limits for tasks assessed                                          General Comments      Exercises     Assessment/Plan    PT Assessment Patient needs continued PT services  PT Problem List Decreased activity tolerance;Decreased mobility;Cardiopulmonary status limiting activity       PT Treatment Interventions Gait training;Stair training;Functional mobility training;Therapeutic activities;Patient/family education;Therapeutic exercise;Balance training    PT Goals (Current goals can be found in the Care Plan section)  Acute Rehab PT Goals Patient Stated Goal: to return home PT Goal Formulation: With patient Time For Goal Achievement: 11/07/22 Potential to Achieve Goals: Good    Frequency Min 2X/week     Co-evaluation               AM-PAC PT "6 Clicks" Mobility  Outcome Measure Help needed turning from your back to your side while in a flat bed without using bedrails?: None Help needed moving from lying on your back to sitting on the side of a flat bed without using bedrails?: None Help needed moving to and from a bed to a chair (including a wheelchair)?: None Help needed standing up from a chair using your arms (e.g., wheelchair or bedside chair)?: None Help needed to walk in hospital room?: A Little Help needed climbing 3-5 steps with a railing? : A Little 6 Click Score: 22    End of Session Equipment Utilized During Treatment: Oxygen Activity Tolerance: Patient tolerated treatment well Patient left: in bed;with call bell/phone within reach Nurse Communication: Mobility status PT Visit Diagnosis: Difficulty in walking, not elsewhere classified (R26.2)    Time: 5597-4163 PT Time  Calculation (min) (ACUTE ONLY): 15 min   Charges:   PT Evaluation $PT Eval Moderate Complexity: 1 Mod          Charolette [name], PT, GCS 10/31/22,11:44 AM

## 2022-10-31 NOTE — Consult Note (Signed)
[facility] Consult Note:10/31/22    Date of Admission:  10/30/2022           Reason for Consult:  AKI   Referring Provider: Richarda Osmond, MD Primary Care Provider: Birdie Sons, MD   History of Presenting Illness:  Taylor [name] is a 81 y.o. male with medical problems of heart failure with reduced EF, Barrett's esophagus, coronary artery disease status post stent, AICD, chronic kidney disease, degenerative disc disease, gout, bladder cancer history, hyperlipidemia, psoriasis, AAA, history of stroke, hypertension, restrictive lung disease, history of pancreatitis, atrial fibrillation, diabetes type 2, carotid stenosis.  He presented from home with dyspnea on exertion and orthopnea for the past week or so.  He stated that he was not able to lay flat.  He does not have any significant leg edema.  His outpatient diuretics were being adjusted by PCP.  He was also treated with antibiotics for sinusitis.  He feels like his abdomen is distended but denies any problems with voiding.  Currently he is under workup for rule out pulmonary embolism.  Baseline creatinine appears to be 2.1 July 23, 2022. Since then creatinine has been steadily increasing and has further increased to 4.83 today. Patient had a CT abdomen and pelvis without contrast on 10/27/2022 which did not show any acute abnormality in the abdomen and pelvis.  He had evidence of stent graft repair of infrarenal aortic and aortic atherosclerosis.  No hydronephrosis was identified.  Bladder was partially decompressed.   Review of Systems: Review of Systems  Constitutional:  Positive for malaise/fatigue. Negative for chills and fever.  HENT:  Negative for hearing loss.   Eyes:  Negative for blurred vision and double vision.  Respiratory:  Positive for shortness of breath. Negative for cough and hemoptysis.   Cardiovascular:  Positive for orthopnea. Negative for chest pain and leg swelling.   Gastrointestinal:  Negative for abdominal pain, heartburn, nausea and vomiting.  Genitourinary:  Negative for dysuria, flank pain, frequency, hematuria and urgency.  Musculoskeletal:  Negative for myalgias.  Skin:  Negative for rash.  Neurological:  Negative for tremors and headaches.  Endo/Heme/Allergies:  Does not bruise/bleed easily.  Psychiatric/Behavioral:  Negative for depression.     Past Medical History:  Diagnosis Date   AAA (abdominal aortic aneurysm) ([place]) 06/03/2007   [facility]; Dr. Kellie Simmering   AICD (automatic cardioverter/defibrillator) present    Barrett's esophagus    Bladder cancer [facility])    Bradycardia    CAD (coronary artery disease)    CHF (congestive heart failure) (H[facility])    Cluster headache    DDD (degenerative disc disease), lumbar    Dyspnea    WITH EXERTION   Edema    LEFT ANKLE   Fracture of skull base ([place]) 1997   due to fall   GERD (gastroesophageal reflux disease)    Gout    History of bladder cancer 12/1995   Hyperlipidemia    Hypertension    Malignant melanoma ([place]) 12/2012   right dorsal forearm excised   Myocardial infarction [facility])    LAST 2014   Osteoarthritis of knee    Pacemaker 10/10/2006   Pneumonia    2016   Pre-diabetes    Psoriasis    Rib fracture 1997   due to fall   Sleep apnea    CPAP   Stroke ([place])    Venous incompetence     Social History   Tobacco Use   Smoking status: Former  Packs/day: 1.00    Types: Cigarettes    Quit date: 11/26/1999    Years since quitting: 22.9   Smokeless tobacco: Never  Vaping Use   Vaping Use: Never used  Substance Use Topics   Alcohol use: No   Drug use: No    Family History  Problem Relation Age of Onset   Cancer Mother        Melanoma skin cancer   Heart attack Father 55   Cancer Father        throat cancer   Arthritis Brother      OBJECTIVE: Blood pressure (!) 119/98, pulse 69, temperature 97.7 F (36.5 C), temperature source Oral, resp. rate 18,  SpO2 98 %.  Physical Exam  Physical Exam: General:  No acute distress, laying in the bed  HEENT  anicteric, moist oral mucous membrane  Pulm/lungs  normal breathing effort, lungs are clear to auscultation, room air  CVS/Heart  regular rhythm, no rub or gallop  Abdomen:   Soft, nontender, distended  Extremities:  No peripheral edema  Neurologic:  Alert, oriented, able to follow commands  Skin:  No acute rashes     Lab Results Lab Results  Component Value Date   WBC 6.1 10/31/2022   HGB 12.4 (L) 10/31/2022   HCT 38.9 (L) 10/31/2022   MCV 103.2 (H) 10/31/2022   PLT 125 (L) 10/31/2022    Lab Results  Component Value Date   CREATININE 4.83 (H) 10/31/2022   BUN 91 (H) 10/31/2022   NA 140 10/31/2022   K 5.6 (H) 10/31/2022   CL 108 10/31/2022   CO2 24 10/31/2022    Lab Results  Component Value Date   ALT 28 10/30/2022   AST 48 (H) 10/30/2022   ALKPHOS 114 10/30/2022   BILITOT 2.1 (H) 10/30/2022     Microbiology: Recent Results (from the past 240 hour(s))  Resp Panel by RT-PCR (Flu A&B, Covid) Anterior Nasal Swab     Status: None   Collection Time: 10/27/22 10:58 AM   Specimen: Anterior Nasal Swab  Result Value Ref Range Status   SARS Coronavirus 2 by RT PCR NEGATIVE NEGATIVE Final    Comment: (NOTE) SARS-CoV-2 target nucleic acids are NOT DETECTED.  The SARS-CoV-2 RNA is generally detectable in upper respiratory specimens during the acute phase of infection. The lowest concentration of SARS-CoV-2 viral copies this assay can detect is 138 copies/mL. A negative result does not preclude SARS-Cov-2 infection and should not be used as the sole basis for treatment or other patient management decisions. A negative result may occur with  improper specimen collection/handling, submission of specimen other than nasopharyngeal swab, presence of viral mutation(s) within the areas targeted by this assay, and inadequate number of viral copies(<138 copies/mL). A negative result  must be combined with clinical observations, patient history, and epidemiological information. The expected result is Negative.  Fact Sheet for Patients:  EntrepreneurPulse.com.au  Fact Sheet for Healthcare Providers:  IncredibleEmployment.be  This test is no t yet approved or cleared by the [place] FDA and  has been authorized for detection and/or diagnosis of SARS-CoV-2 by FDA under an Emergency Use Authorization (EUA). This EUA will remain  in effect (meaning this test can be used) for the duration of the COVID-19 declaration under Section 564(b)(1) of the Act, 21 U.S.C.section 360bbb-3(b)(1), unless the authorization is terminated  or revoked sooner.       Influenza A by PCR NEGATIVE NEGATIVE Final   Influenza B by PCR NEGATIVE NEGATIVE Final  Comment: (NOTE) The Xpert Xpress SARS-CoV-2/FLU/RSV plus assay is intended as an aid in the diagnosis of influenza from Nasopharyngeal swab specimens and should not be used as a sole basis for treatment. Nasal washings and aspirates are unacceptable for Xpert Xpress SARS-CoV-2/FLU/RSV testing.  Fact Sheet for Patients: EntrepreneurPulse.com.au  Fact Sheet for Healthcare Providers: IncredibleEmployment.be  This test is not yet approved or cleared by the [place] FDA and has been authorized for detection and/or diagnosis of SARS-CoV-2 by FDA under an Emergency Use Authorization (EUA). This EUA will remain in effect (meaning this test can be used) for the duration of the COVID-19 declaration under Section 564(b)(1) of the Act, 21 U.S.C. section 360bbb-3(b)(1), unless the authorization is terminated or revoked.  Performed at [facility], [address] [place],  [address]     Medications: Scheduled Meds:  allopurinol  150 mg Oral Daily   apixaban  5 mg Oral BID   aspirin EC  81 mg Oral Daily   atorvastatin  40 mg Oral Daily    carvedilol  6.25 mg Oral BID WC   pantoprazole  40 mg Oral Daily   Continuous Infusions:  sodium chloride 50 mL/hr at 10/31/22 0909   PRN Meds:.acetaminophen **OR** acetaminophen, bisacodyl, ondansetron **OR** ondansetron (ZOFRAN) IV, polyethylene glycol, triamcinolone ointment  Allergies  Allergen Reactions   Amlodipine Besylate Swelling    Had a reaction when taking with colcrys    Crestor [Rosuvastatin]     Muscle cramps and pain   Rocephin [Ceftriaxone]     unknown    Urinalysis: No results for input(s): "COLORURINE", "LABSPEC", "PHURINE", "GLUCOSEU", "HGBUR", "BILIRUBINUR", "KETONESUR", "PROTEINUR", "UROBILINOGEN", "NITRITE", "LEUKOCYTESUR" in the last 72 hours.  Invalid input(s): "APPERANCEUR"    Imaging: NM Pulmonary Perfusion  Result Date: 10/31/2022 CLINICAL DATA:  Pulmonary embolism suspected. Low to intermediate probability. Positive D-dimer. EXAM: NUCLEAR MEDICINE PERFUSION LUNG SCAN TECHNIQUE: Perfusion images were obtained in multiple projections after intravenous injection of radiopharmaceutical. Ventilation scans intentionally deferred if perfusion scan and chest x-ray adequate for interpretation during COVID 19 epidemic. RADIOPHARMACEUTICALS:  3.61 mCi Tc-41mMAA IV COMPARISON:  No prior ventilation perfusion studies are available for comparison. Correlation is made with AP chest 10/30/2022 and chest two views 10/15/2022 FINDINGS: Perfusion only modified PIOPED II protocol. Cardiomegaly is again noted. There is mild heterogeneity of the perfusion radiotracer bilaterally. No large segmental perfusion defect is seen. IMPRESSION: Very low probability for pulmonary embolism. Electronically Signed   By: RYvonne KendallM.D.   On: 10/31/2022 13:38   ECHOCARDIOGRAM COMPLETE  Result Date: 10/31/2022    ECHOCARDIOGRAM REPORT   Patient Name:   SGLENMORE KARLDate of Exam: 10/30/2022 Medical Rec #:  [id number]   Height:       70.0 in Accession #:    [id number]  Weight:       189.0  lb Date of Birth:  [birth year]  BSA:          2.038 m Patient Age:    89years     BP:           119/85 mmHg Patient Gender: M            HR:           75 bpm. Exam Location:  [facility] Procedure: 2D Echo, Cardiac Doppler, Color Doppler and Intracardiac            Opacification Agent Indications:     I50.9 Congestive heart failure  History:  Patient has no prior history of Echocardiogram examinations.                  Previous Myocardial Infarction and CAD, Defibrillator, Stroke,                  Signs/Symptoms:Dyspnea and Edema; Risk Factors:Hypertension and                  Dyslipidemia.  Sonographer:     Cresenciano Lick RDCS Referring Phys:  [id number] Richarda Osmond Diagnosing Phys: Serafina Royals MD IMPRESSIONS  1. Left ventricular ejection fraction, by estimation, is <20%. The left ventricle has severely decreased function. The left ventricle demonstrates global hypokinesis. The left ventricular internal cavity size was severely dilated. There is mild left ventricular hypertrophy. Left ventricular diastolic parameters are consistent with Grade II diastolic dysfunction (pseudonormalization).  2. Right ventricular systolic function is normal. The right ventricular size is normal.  3. Left atrial size was severely dilated.  4. Right atrial size was moderately dilated.  5. The mitral valve is degenerative. Severe mitral valve regurgitation.  6. Tricuspid valve regurgitation is moderate to severe.  7. The aortic valve is normal in structure. Aortic valve regurgitation is mild. FINDINGS  Left Ventricle: Left ventricular ejection fraction, by estimation, is <20%. The left ventricle has severely decreased function. The left ventricle demonstrates global hypokinesis. Definity contrast agent was given IV to delineate the left ventricular endocardial borders. The left ventricular internal cavity size was severely dilated. There is mild left ventricular hypertrophy. Left ventricular diastolic parameters are  consistent with Grade II diastolic dysfunction (pseudonormalization). Right Ventricle: The right ventricular size is normal. No increase in right ventricular wall thickness. Right ventricular systolic function is normal. Left Atrium: Left atrial size was severely dilated. Right Atrium: Right atrial size was moderately dilated. Pericardium: There is no evidence of pericardial effusion. Mitral Valve: The mitral valve is degenerative in appearance. Severe mitral valve regurgitation. Tricuspid Valve: The tricuspid valve is normal in structure. Tricuspid valve regurgitation is moderate to severe. Aortic Valve: The aortic valve is normal in structure. Aortic valve regurgitation is mild. Aortic valve mean gradient measures 4.8 mmHg. Aortic valve peak gradient measures 8.6 mmHg. Aortic valve area, by VTI measures 1.45 cm. Pulmonic Valve: The pulmonic valve was normal in structure. Pulmonic valve regurgitation is mild. Aorta: The aortic root and ascending aorta are structurally normal, with no evidence of dilitation. IAS/Shunts: No atrial level shunt detected by color flow Doppler.  LEFT VENTRICLE PLAX 2D LVIDd:         6.30 cm LVIDs:         5.00 cm LV PW:         0.80 cm LV IVS:        0.80 cm LVOT diam:     2.30 cm LV SV:         40 LV SV Index:   20 LVOT Area:     4.15 cm  RIGHT VENTRICLE            IVC RV Basal diam:  5.20 cm    IVC diam: 2.20 cm RV S prime:     5.78 cm/s TAPSE (M-mode): 1.5 cm LEFT ATRIUM             Index        RIGHT ATRIUM           Index LA diam:        4.70 cm 2.31 cm/m   RA Area:  24.50 cm LA Vol (A2C):   85.9 ml 42.15 ml/m  RA Volume:   87.90 ml  43.14 ml/m LA Vol (A4C):   88.6 ml 43.48 ml/m LA Biplane Vol: 89.3 ml 43.82 ml/m  AORTIC VALVE AV Area (Vmax):    1.42 cm AV Area (Vmean):   1.25 cm AV Area (VTI):     1.45 cm AV Vmax:           146.40 cm/s AV Vmean:          99.860 cm/s AV VTI:            0.278 m AV Peak Grad:      8.6 mmHg AV Mean Grad:      4.8 mmHg LVOT Vmax:          49.90 cm/s LVOT Vmean:        30.150 cm/s LVOT VTI:          0.097 m LVOT/AV VTI ratio: 0.35  AORTA Ao Root diam: 3.70 cm Ao Asc diam:  4.20 cm MITRAL VALVE               TRICUSPID VALVE MV Area (PHT): 5.81 cm    TR Peak grad:   23.2 mmHg MV Decel Time: 131 msec    TR Vmax:        241.00 cm/s MV E velocity: 84.10 cm/s MV A velocity: 35.10 cm/s  SHUNTS MV E/A ratio:  2.40        Systemic VTI:  0.10 m                            Systemic Diam: 2.30 cm Serafina Royals MD Electronically signed by Serafina Royals MD Signature Date/Time: 10/31/2022/9:20:54 AM    Final    DG Chest Portable 1 View  Result Date: 10/30/2022 CLINICAL DATA:  Shortness of breath, hypoxia EXAM: PORTABLE CHEST 1 VIEW COMPARISON:  Previous studies including the examination of 10/27/2022 FINDINGS: Transverse diameter of heart is increased. Central pulmonary vessels are prominent. There are no signs of alveolar pulmonary edema. There is no focal pulmonary consolidation. There is no pleural effusion or pneumothorax. There is previous coronary bypass surgery. Pacemaker/defibrillator battery is seen in left infraclavicular region with tips of leads in right atrium and right ventricle. IMPRESSION: Cardiomegaly. Central pulmonary vessels are prominent suggesting mild CHF. No focal pulmonary consolidation is seen. There is no significant pleural effusion. Electronically Signed   By: Elmer Picker M.D.   On: 10/30/2022 16:10      Assessment/Plan:  Taylor [name] is a 81 y.o. male with medical problems of medical problems of heart failure with reduced EF, Barrett's esophagus, coronary artery disease status post stent, AICD, chronic kidney disease, degenerative disc disease, gout, bladder cancer history, hyperlipidemia, psoriasis, AAA, history of stroke, hypertension, restrictive lung disease, history of pancreatitis, atrial fibrillation, diabetes type 2, carotid stenosis, obstructive sleep apnea was admitted on 10/30/2022 for :  Congestive heart  failure (CHF) ([place]) [I50.9]  1.  Acute kidney injury 2.  Acute on chronic systolic and diastolic dysfunction.  Per chart, heart failure with reduced EF 20%, severely dilated left ventricular internal cavity, grade 2 diastolic dysfunction, severe left atrial enlargement, moderate right atrial enlargement, severe mitral regurgitation, moderate to severe tricuspid regurgitation, mild aortic insufficiency. 3.  Chronic kidney disease stage IIIb.  Baseline creatinine 2.1/GFR 32 from 07/23/2022. 4.  Diabetes type 2 with nephropathy.  Urinalysis negative for blood or WBCs from  December 3.  Mild proteinuria noted.  Imaging negative for obstruction on December 3.  Acute kidney injury likely secondary to cardiorenal syndrome At present, patient is not in any respiratory distress and does not have any peripheral edema.  His abdomen does feel distended.  He may have some underlying ascites.  Plan: Obtain bladder ultrasound to rule out urinary retention Avoid hypotension CHF treatment as per cardiology team Supportive care. Electrolytes and volume status are acceptable at present.  No acute indication for dialysis but will follow closely.    Jeselle Hiser Candiss Norse 10/31/22

## 2022-10-31 NOTE — ED Notes (Signed)
This RN at bedside, pt in stretcher a&04.  NAD.

## 2022-10-31 NOTE — Discharge Instructions (Addendum)
Your heart function and kidney function have both decreased since your last hospitalization. Because of this, several changes have been made to your medications which are listed below.   Please use oxygen 1-2L by nasal canula until your follow up with cardiology.  Your eliquis dose has decreased to 2.'5mg'$  daily Your losartan has been stopped. Instead, you are on imdur for your blood pressure control since it does not impact your kidney function.   Please make an appointment to follow up with your heart doctor within 1-2 weeks Please also follow up with your kidney doctor within 1-2 weeks. If you do not have one already, your PCP can check your kidney function and refer you to a kidney doctor.  Low Sodium Nutrition Therapy  Eating less sodium can help you if you have high blood pressure, heart failure, or kidney or liver disease.   Your body needs a little sodium, but too much sodium can cause your body to hold onto extra water. This extra water will raise your blood pressure and can cause damage to your heart, kidneys, or liver as they are forced to work harder.   Sometimes you can see how the extra fluid affects you because your hands, legs, or belly swell. You may also hold water around your heart and lungs, which makes it hard to breathe.   Even if you take medication for blood pressure or a water pill (diuretic) to remove fluid, it is still important to have less salt in your diet.   Check with your primary care provider before drinking alcohol since it may affect the amount of fluid in your body and how your heart, kidneys, or liver work. Sodium in Food A low-sodium meal plan limits the sodium that you get from food and beverages to 1,500-2,000 milligrams (mg) per day. Salt is the main source of sodium. Read the nutrition label on the package to find out how much sodium is in one serving of a food.  Select foods with 140 milligrams (mg) of sodium or less per serving.  You may be able to eat  one or two servings of foods with a little more than 140 milligrams (mg) of sodium if you are closely watching how much sodium you eat in a day.  Check the serving size on the label. The amount of sodium listed on the label shows the amount in one serving of the food. So, if you eat more than one serving, you will get more sodium than the amount listed.  Tips Cutting Back on Sodium Eat more fresh foods.  Fresh fruits and vegetables are low in sodium, as well as frozen vegetables and fruits that have no added juices or sauces.  Fresh meats are lower in sodium than processed meats, such as bacon, sausage, and hotdogs.  Not all processed foods are unhealthy, but some processed foods may have too much sodium.  Eat less salt at the table and when cooking. One of the ingredients in salt is sodium.  One teaspoon of table salt has 2,300 milligrams of sodium.  Leave the salt out of recipes for pasta, casseroles, and soups. Be a Paramedic.  Food packages that say "Salt-free", sodium-free", "very low sodium," and "low sodium" have less than 140 milligrams of sodium per serving.  Beware of products identified as "Unsalted," "No Salt Added," "Reduced Sodium," or "Lower Sodium." These items may still be high in sodium. You should always check the nutrition label. Add flavors to your food without adding sodium.  Try lemon juice, lime juice, or vinegar.  Dry or fresh herbs add flavor.  Buy a sodium-free seasoning blend or make your own at home. You can purchase salt-free or sodium-free condiments like barbeque sauce in stores and online. Ask your registered dietitian nutritionist for recommendations and where to find them.   Eating in Restaurants Choose foods carefully when you eat outside your home. Restaurant foods can be very high in sodium. Many restaurants provide nutrition facts on their menus or their websites. If you cannot find that information, ask your server. Let your server know that you want  your food to be cooked without salt and that you would like your salad dressing and sauces to be served on the side.    Foods Recommended Food Group Foods Recommended  Grains Bread, bagels, rolls without salted tops Homemade bread made with reduced-sodium baking powder Cold cereals, especially shredded wheat and puffed rice Oats, grits, or cream of wheat Pastas, quinoa, and rice Popcorn, pretzels or crackers without salt Corn tortillas  Protein Foods Fresh meats and fish; [place] bacon (check the nutrition labels - make sure they are not packaged in a sodium solution) Canned or packed tuna (no more than 4 ounces at 1 serving) Beans and peas Soybeans) and tofu Eggs Nuts or nut butters without salt  Dairy Milk or milk powder Plant milks, such as rice and soy Yogurt, including Greek yogurt Small amounts of natural cheese (blocks of cheese) or reduced-sodium cheese can be used in moderation. (Swiss, ricotta, and fresh mozzarella cheese are lower in sodium than the others) Cream Cheese Low sodium cottage cheese  Vegetables Fresh and frozen vegetables without added sauces or salt Homemade soups (without salt) Low-sodium, salt-free or sodium-free canned vegetables and soups  Fruit Fresh and canned fruits Dried fruits, such as raisins, cranberries, and prunes  Oils Tub or liquid margarine, regular or without salt Canola, corn, peanut, olive, safflower, or sunflower oils  Condiments Fresh or dried herbs such as basil, bay leaf, dill, mustard (dry), nutmeg, paprika, parsley, rosemary, sage, or thyme.  Low sodium ketchup Vinegar  Lemon or lime juice Pepper, red pepper flakes, and cayenne. Hot sauce contains sodium, but if you use just a drop or two, it will not add up to much.  Salt-free or sodium-free seasoning mixes and marinades Simple salad dressings: vinegar and oil   Foods Not Recommended Food Group Foods Not Recommended  Grains Breads or crackers topped with salt Cereals  (hot/cold) with more than 300 mg sodium per serving Biscuits, cornbread, and other "quick" breads prepared with baking soda Pre-packaged bread crumbs Seasoned and packaged rice and pasta mixes Self-rising flours  Protein Foods Cured meats: Bacon, ham, sausage, pepperoni and hot dogs Canned meats (chili, vienna sausage, or sardines) Smoked fish and meats Frozen meals that have more than 600 mg of sodium per serving Egg substitute (with added sodium)  Dairy Buttermilk Processed cheese spreads Cottage cheese (1 cup may have over 500 mg of sodium; look for low-sodium.) American or feta cheese Shredded Cheese has more sodium than blocks of cheese String cheese  Vegetables Canned vegetables (unless they are salt-free, sodium-free or low sodium) Frozen vegetables with seasoning and sauces Sauerkraut and pickled vegetables Canned or dried soups (unless they are salt-free, sodium-free, or low sodium) [place] fries and onion rings  Fruit Dried fruits preserved with additives that have sodium  Oils Salted butter or margarine, all types of olives  Condiments Salt, sea salt, kosher salt, onion salt, and garlic salt Seasoning  mixes with salt Bouillon cubes Ketchup Barbeque sauce and Worcestershire sauce unless low sodium Soy sauce Salsa, pickles, olives, relish Salad dressings: ranch, blue cheese, [place], and [place].   Low Sodium Sample 1-Day Menu  Breakfast 1 cup cooked oatmeal  1 slice whole wheat bread toast  1 tablespoon peanut butter without salt  1 banana  1 cup 1% milk  Lunch Tacos made with: 2 corn tortillas   cup black beans, low sodium   cup roasted or grilled chicken (without skin)   avocado  Squeeze of lime juice  1 cup salad greens  1 tablespoon low-sodium salad dressing   cup strawberries  1 orange  Afternoon Snack 1/3 cup grapes  6 ounces yogurt  Evening Meal 3 ounces herb-baked fish  1 baked potato  2 teaspoons olive oil   cup cooked carrots  2 thick slices  tomatoes on:  2 lettuce leaves  1 teaspoon olive oil  1 teaspoon balsamic vinegar  1 cup 1% milk  Evening Snack 1 apple   cup almonds without salt   Low-Sodium Vegetarian (Lacto-Ovo) Sample 1-Day Menu  Breakfast 1 cup cooked oatmeal  1 slice whole wheat toast  1 tablespoon peanut butter without salt  1 banana  1 cup 1% milk  Lunch Tacos made with: 2 corn tortillas   cup black beans, low sodium   cup roasted or grilled chicken (without skin)   avocado  Squeeze of lime juice  1 cup salad greens  1 tablespoon low-sodium salad dressing   cup strawberries  1 orange  Evening Meal Stir fry made with:  cup tofu  1 cup brown rice   cup broccoli   cup green beans   cup peppers   tablespoon peanut oil  1 orange  1 cup 1% milk  Evening Snack 4 strips celery  2 tablespoons hummus  1 hard-boiled egg   Low-Sodium Vegan Sample 1-Day Menu  Breakfast 1 cup cooked oatmeal  1 tablespoon peanut butter without salt  1 cup blueberries  1 cup soymilk fortified with calcium, vitamin B12, and vitamin D  Lunch 1 small whole wheat pita   cup cooked lentils  2 tablespoons hummus  4 carrot sticks  1 medium apple  1 cup soymilk fortified with calcium, vitamin B12, and vitamin D  Evening Meal Stir fry made with:  cup tofu  1 cup brown rice   cup broccoli   cup green beans   cup peppers   tablespoon peanut oil  1 cup cantaloupe  Evening Snack 1 cup soy yogurt   cup mixed nuts  Copyright 2020  [employer]. All rights reserved  Sodium Free Flavoring Tips  When cooking, the following items may be used for flavoring instead of salt or seasonings that contain sodium. Remember: A little bit of spice goes a long way! Be careful not to overseason. Spice Blend Recipe (makes about ? cup) 5 teaspoons onion powder  2 teaspoons garlic powder  2 teaspoons paprika  2 teaspoon dry mustard  1 teaspoon crushed thyme leaves   teaspoon white pepper    teaspoon celery seed Food Item Flavorings  Beef Basil, bay leaf, caraway, curry, dill, dry mustard, garlic, grape jelly, green pepper, mace, marjoram, mushrooms (fresh), nutmeg, onion or onion powder, parsley, pepper, rosemary, sage  Chicken Basil, cloves, cranberries, mace, mushrooms (fresh), nutmeg, oregano, paprika, parsley, pineapple, saffron, sage, savory, tarragon, thyme, tomato, turmeric  Egg Chervil, curry, dill, dry mustard, garlic or garlic powder, green pepper, jelly,  mushrooms (fresh), nutmeg, onion powder, paprika, parsley, rosemary, tarragon, tomato  Fish Basil, bay leaf, chervil, curry, dill, dry mustard, green pepper, lemon juice, marjoram, mushrooms (fresh), paprika, pepper, tarragon, tomato, turmeric  Lamb Cloves, curry, dill, garlic or garlic powder, mace, mint, mint jelly, onion, oregano, parsley, pineapple, rosemary, tarragon, thyme  Pork Applesauce, basil, caraway, chives, cloves, garlic or garlic powder, onion or onion powder, rosemary, thyme  Veal Apricots, basil, bay leaf, currant jelly, curry, ginger, marjoram, mushrooms (fresh), oregano, paprika  Vegetables Basil, dill, garlic or garlic powder, ginger, lemon juice, mace, marjoram, nutmeg, onion or onion powder, tarragon, tomato, sugar or sugar substitute, salt-free salad dressing, vinegar  Desserts Allspice, anise, cinnamon, cloves, ginger, mace, nutmeg, vanilla extract, other extracts   Copyright 2020  [employer]. All rights reserved  Fluid Restricted Nutrition Therapy  You have been prescribed this diet because your condition affects how much fluid you can eat or drink. If your heart, liver, or kidneys aren't working properly, you may not be able to effectively eliminate fluids from the body and this may cause swelling (edema) in the legs, arms, and/or stomach. Drink no more than _________ liters or ________ ounces or ________cups of fluid per day.  You don't need to stop eating or drinking the  same fluids you normally would, but you may need to eat or drink less than usual.  Your registered dietitian nutritionist will help you determine the correct amount of fluid to consume during the day Breakfast Include fluids taken with medications  Lunch Include fluids taken with medications  Dinner Include fluids taken with medications  Bedtime Snack Include fluids taken with medications     Tips What Are Fluids?  A fluid is anything that is liquid or anything that would melt if left at room temperature. You will need to count these foods and liquids--including any liquid used to take medication--as part of your daily fluid intake. Some examples are: Alcohol (drink only with your doctor's permission)  Coffee, tea, and other hot beverages  Gelatin (Jell-O)  Gravy  Ice cream, sherbet, sorbet  Ice cubes, ice chips  Milk, liquid creamer  Nutritional supplements  Popsicles  Vegetable and fruit juices; fluid in canned fruit  Watermelon  Yogurt  Soft drinks, lemonade, limeade  Soups  Syrup How Do I Measure My Fluid Intake? Record your fluid intake daily.  Tip: Every day, each time you eat or drink fluids, pour water in the same amount into an empty container that can hold the same amount of fluids you are allowed daily. This may help you keep track of how much fluid you are taking in throughout the day.  To accurately keep track of how much liquid you take in, measure the size of the cups, glasses, and bowls you use. If you eat soup, measure how much of it is liquid and how much is solid (such as noodles, vegetables, meat). Conversions for Measuring Fluid Intake  Milliliters (mL) Liters (L) Ounces (oz) Cups (c)  1000 1 32 4  1200 1.2 40 5  1500 1.5 50 6 1/4  1800 1.8 60 7 1/2  2000 2 67 8 1/3  Tips to Reduce Your Thirst Chew gum or suck on hard candy.  Rinse or gargle with mouthwash. Do not swallow.  Ice chips or popsicles my help quench thirst, but this too needs to be calculated  into the total restriction. Melt ice chips or cubes first to figure out how much fluid they produce (for  example, experiment with melting  cup ice chips or 2 ice cubes).  Add a lemon wedge to your water.  Limit how much salt you take in. A high salt intake might make you thirstier.  Don't eat or drink all your allowed liquids at once. Space your liquids out through the day.  Use small glasses and cups and sip slowly. If allowed, take your medications with fluids you eat or drink during a meal.   Fluid-Restricted Nutrition Therapy Sample 1-Day Menu  Breakfast 1 slice wheat toast  1 tablespoon peanut butter  1/2 cup yogurt (120 milliliters)  1/2 cup blueberries  1 cup milk (240 milliliters)   Lunch 3 ounces sliced [place]  2 slices whole wheat bread  1/2 cup lettuce for sandwich  2 slices tomato for sandwich  1 ounce reduced-fat, reduced-sodium cheese  1/2 cup fresh carrot sticks  1 banana  1 cup unsweetened tea (240 milliliters)   Evening Meal 8 ounces soup (240 milliliters)  3 ounces salmon  1/2 cup quinoa  1 cup green beans  1 cup mixed greens salad  1 tablespoon olive oil  1 cup coffee (240 milliliters)  Evening Snack 1/2 cup sliced peaches  1/2 cup frozen yogurt (120 milliliters)  1 cup water (240 milliliters)  Copyright 2020  [employer]. All rights reserved

## 2022-10-31 NOTE — Consult Note (Signed)
[facility] Consultation Note  Patient ID: Taylor [name], MRN: [id number], DOB/AGE: 1941/08/02 81 y.o. Admit date: 10/30/2022   Date of Consult: 10/31/2022 Primary Physician: Birdie Sons, MD Primary Cardiologist: Nehemiah Massed  Chief Complaint:  Chief Complaint  Patient presents with   Shortness of Breath   Weakness    Dyspnea, SOB, and weakness since Sunday   Reason for Consult: HPI     Weakness    Additional comments: Dyspnea, SOB, and weakness since Sunday      Last edited by Reiffer, Sherlyn Lick, RN on 10/30/2022  3:14 PM.       HPI: 81 y.o. male with known coronary artery disease status post previous myocardial infarction cardiomyopathy event of V-fib arrest in the remote past having appropriate medication management and previous history of defibrillator pacer.  The patient has had significant chronic kidney disease stage IV for which has been a significant problem causing him to have multiple episodes where he has had fluid retention.  Recently has had more lower extremity edema PND orthopnea and shortness of breath with physical activity.  When seen in the emergency room he has had chest x-ray showing mild pulmonary edema lower extremity edema and EKG showing AV pacing troponin of 30 BNP of 554 and a GFR of 11.  The patient is not having any chest pain at this time and has had some improvement with intravenous Lasix.  Additionally the patient previously has had paroxysmal nonvalvular atrial fibrillation currently without evidence of atrial fibrillation and appropriately on anticoagulation without any bleeding complications  Past Medical History:  Diagnosis Date   AAA (abdominal aortic aneurysm) ([place]) 06/03/2007   [facility]; Dr. Kellie Simmering   AICD (automatic cardioverter/defibrillator) present    Barrett's esophagus    Bladder cancer [facility])    Bradycardia    CAD (coronary artery disease)    CHF (congestive heart failure) (HCC)    Cluster headache     DDD (degenerative disc disease), lumbar    Dyspnea    WITH EXERTION   Edema    LEFT ANKLE   Fracture of skull base ([place]) 1997   due to fall   GERD (gastroesophageal reflux disease)    Gout    History of bladder cancer 12/1995   Hyperlipidemia    Hypertension    Malignant melanoma ([place]) 12/2012   right dorsal forearm excised   Myocardial infarction [facility])    LAST 2014   Osteoarthritis of knee    Pacemaker 10/10/2006   Pneumonia    2016   Pre-diabetes    Psoriasis    Rib fracture 1997   due to fall   Sleep apnea    CPAP   Stroke ([place])    Venous incompetence       Surgical History:  Past Surgical History:  Procedure Laterality Date   ABDOMINAL AORTIC ANEURYSM REPAIR  06/03/2007   [facility]; Dr. Kellie Simmering   ANGIOPLASTY  1994   MI   BLADDER TUMOR EXCISION  12/1995   CAROTID PTA/STENT INTERVENTION Left 07/23/2022   Procedure: CAROTID PTA/STENT INTERVENTION;  Surgeon: Katha Cabal, MD;  Location: [facility];  Service: Cardiovascular;  Laterality: Left;   CATARACT EXTRACTION W/PHACO Left 10/22/2016   Procedure: CATARACT EXTRACTION PHACO AND INTRAOCULAR LENS PLACEMENT (IOC);  Surgeon: Birder Robson, MD;  Location: [facility] ORS;  Service: Ophthalmology;  Laterality: Left;  [place] 47.7 AP% 18.4 CDE 8.78 Fluid pack lot # [id number]   CATARACT EXTRACTION W/PHACO Right  12/10/2016   Procedure: CATARACT EXTRACTION PHACO AND INTRAOCULAR LENS PLACEMENT (IOC);  Surgeon: Birder Robson, MD;  Location: [facility] ORS;  Service: Ophthalmology;  Laterality: Right;  [place] 00:39 AP% 23.3 CDE 9.13 Fluid pack lot # [id number]   CORONARY ANGIOPLASTY     STENTS X 5   CORONARY ARTERY BYPASS GRAFT  09/22/2006   four   ELBOW BURSA SURGERY     DUE TO GOUT   INSERT / REPLACE / REMOVE PACEMAKER     MELANOMA EXCISION  12/2012   Right forearm     Home Meds: Prior to Admission medications   Medication Sig Start Date End Date Taking? Authorizing Provider  allopurinol  (ZYLOPRIM) 300 MG tablet Take 0.5 tablets (150 mg total) by mouth daily. 09/25/22  Yes Birdie Sons, MD  apixaban (ELIQUIS) 5 MG TABS tablet Take 1 tablet (5 mg total) by mouth 2 (two) times daily. 07/24/22 03/15/24 Yes Schnier, Dolores Lory, MD  aspirin 81 MG EC tablet Take 81 mg by mouth daily.    Yes [provider]  atorvastatin (LIPITOR) 40 MG tablet TAKE 1 TABLET(40 MG) BY MOUTH DAILY 05/22/22  Yes Birdie Sons, MD  carvedilol (COREG) 12.5 MG tablet Take 12.5 mg by mouth 2 (two) times daily with a meal.   Yes [provider]  Cyanocobalamin (B-12) 2500 MCG TABS Take 2,500 mcg by mouth daily.   Yes [provider]  doxycycline (VIBRA-TABS) 100 MG tablet Take 1 tablet (100 mg total) by mouth 2 (two) times daily for 7 days. 10/27/22 11/03/22 Yes Merlyn Lot, MD  GLUCOSAMINE HCL PO Take 1,500 mg by mouth daily.   Yes [provider]  losartan (COZAAR) 50 MG tablet TAKE 1/2 TABLET(25 MG) BY MOUTH DAILY 08/06/22  Yes Simmons-Robinson, Makiera, MD  Magnesium Oxide 250 MG TABS Take 250 mg by mouth daily.   Yes [provider]  metFORMIN (GLUCOPHAGE-XR) 500 MG 24 hr tablet TAKE 2 TABLETS(1000 MG) BY MOUTH DAILY Patient taking differently: Take 1,000 mg by mouth daily with breakfast. 03/11/22  Yes Birdie Sons, MD  pantoprazole (PROTONIX) 40 MG tablet TAKE 1 TABLET BY MOUTH EVERY DAY 09/14/21  Yes Birdie Sons, MD  potassium chloride SA (KLOR-CON) 20 MEQ tablet Take 20 mEq by mouth daily. 08/31/20  Yes [provider]  torsemide (DEMADEX) 20 MG tablet Take 40 mg by mouth daily. 09/04/20  Yes [provider]  Calcium Carbonate Antacid 600 MG chewable tablet Chew 1 tablet by mouth daily.    [provider]  glucose blood (ONETOUCH VERIO) test strip Use as instructed to check sugar daily 12/14/19   Birdie Sons, MD  triamcinolone ointment (KENALOG) 0.1 % APPLY TOPICALLY TWICE DAILY AS DIRECTED 07/15/22   Birdie Sons,  MD    Inpatient Medications:   allopurinol  150 mg Oral Daily   apixaban  5 mg Oral BID   aspirin EC  81 mg Oral Daily   atorvastatin  40 mg Oral Daily   carvedilol  6.25 mg Oral BID WC   losartan  25 mg Oral Daily   pantoprazole  40 mg Oral Daily    sodium chloride 50 mL/hr at 10/31/22 0909    Allergies:  Allergies  Allergen Reactions   Amlodipine Besylate Swelling    Had a reaction when taking with colcrys    Crestor [Rosuvastatin]     Muscle cramps and pain   Rocephin [Ceftriaxone]     unknown    Social History  Socioeconomic History   Marital status: Married    Spouse name: Not on file   Number of children: 3   Years of education: Not on file   Highest education level: 12th grade  Occupational History   Occupation: retired    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  Independent at baseline.   Social Determinants of Health   Financial Resource Strain: Low Risk  (05/07/2022)   Overall Financial Resource Strain (CARDIA)    Difficulty of Paying Living Expenses: Not hard at all  Food Insecurity: No Food Insecurity (10/31/2022)   Hunger Vital Sign    Worried About Running Out of Food in the Last Year: Never true    Ran Out of Food in the Last Year: Never true  Transportation Needs: No Transportation Needs (10/31/2022)   PRAPARE - Hydrologist (Medical): No    Lack of Transportation (Non-Medical): No  Physical Activity: Insufficiently Active (04/18/2021)   Exercise Vital Sign    Days of Exercise per Week: 1 day    Minutes of Exercise per  Session: 120 min  Stress: No Stress Concern Present (05/07/2022)   [place]    Feeling of Stress : Not at all  Social Connections: Moderately Isolated (05/07/2022)   Social Connection and Isolation Panel [NHANES]    Frequency of Communication with Friends and Family: Never    Frequency of Social Gatherings with Friends and Family: Once a week    Attends Religious Services: More than 4 times per year    Active Member of [employer] or Organizations: No    Attends Archivist Meetings: Never    Marital Status: Married  Human resources officer Violence: Not At Risk (10/31/2022)   Humiliation, Afraid, Rape, and Kick questionnaire    Fear of Current or Ex-Partner: No    Emotionally Abused: No    Physically Abused: No    Sexually Abused: No     Family History  Problem Relation Age of Onset   Cancer Mother        Melanoma skin cancer   Heart attack Father 59   Cancer Father        throat cancer   Arthritis Brother      Review of Systems Positive for PND orthopnea Negative for: General:  chills, fever, night sweats or weight changes.  Cardiovascular: Positive for PND orthopnea negative for syncope dizziness  Dermatological skin lesions rashes Respiratory: Cough congestion Urologic: Frequent urination urination at night and hematuria Abdominal: negative for nausea, vomiting, diarrhea, bright red blood per rectum, melena, or hematemesis Neurologic: negative for visual changes, and/or hearing changes  All other systems reviewed and are otherwise negative except as noted above.  Labs: No results for input(s): "CKTOTAL", "CKMB", "TROPONINI" in the last 72 hours. Lab Results  Component Value Date   WBC 6.1 10/31/2022   HGB 12.4 (L) 10/31/2022   HCT 38.9 (L) 10/31/2022   MCV 103.2 (H)  10/31/2022   PLT 125 (L) 10/31/2022    Recent Labs  Lab 10/30/22 1540 10/30/22 1844 10/31/22 0419  NA 138  --  140  K 5.3*  --   5.6*  CL 107  --  108  CO2 22  --  24  BUN 80*  --  91*  CREATININE 4.28*   < > 4.83*  CALCIUM 9.1  --  8.9  PROT 7.2  --   --   BILITOT 2.1*  --   --   ALKPHOS 114  --   --   ALT 28  --   --   AST 48*  --   --   GLUCOSE 130*  --  103*   < > = values in this interval not displayed.   Lab Results  Component Value Date   CHOL 112 12/11/2021   HDL 38 (L) 12/11/2021   LDLCALC 54 12/11/2021   TRIG 107 12/11/2021   Lab Results  Component Value Date   DDIMER 2.52 (H) 10/30/2022    Radiology/Studies:  NM Pulmonary Perfusion  Result Date: 10/31/2022 CLINICAL DATA:  Pulmonary embolism suspected. Low to intermediate probability. Positive D-dimer. EXAM: NUCLEAR MEDICINE PERFUSION LUNG SCAN TECHNIQUE: Perfusion images were obtained in multiple projections after intravenous injection of radiopharmaceutical. Ventilation scans intentionally deferred if perfusion scan and chest x-ray adequate for interpretation during COVID 19 epidemic. RADIOPHARMACEUTICALS:  3.61 mCi Tc-51mMAA IV COMPARISON:  No prior ventilation perfusion studies are available for comparison. Correlation is made with AP chest 10/30/2022 and chest two views 10/15/2022 FINDINGS: Perfusion only modified PIOPED II protocol. Cardiomegaly is again noted. There is mild heterogeneity of the perfusion radiotracer bilaterally. No large segmental perfusion defect is seen. IMPRESSION: Very low probability for pulmonary embolism. Electronically Signed   By: RYvonne KendallM.D.   On: 10/31/2022 13:38   ECHOCARDIOGRAM COMPLETE  Result Date: 10/31/2022    ECHOCARDIOGRAM REPORT   Patient Name:   SAZAZEL FRANZEDate of Exam: 10/30/2022 Medical Rec #:  [id number]   Height:       70.0 in Accession #:    [id number]  Weight:       189.0 lb Date of Birth:  [birth year]  BSA:          2.038 m Patient Age:    89years     BP:           119/85 mmHg Patient Gender: M            HR:           75 bpm. Exam Location:  [facility] Procedure: 2D Echo, Cardiac Doppler,  Color Doppler and Intracardiac            Opacification Agent Indications:     I50.9 Congestive heart failure  History:         Patient has no prior history of Echocardiogram examinations.                  Previous Myocardial Infarction and CAD, Defibrillator, Stroke,                  Signs/Symptoms:Dyspnea and Edema; Risk Factors:Hypertension and                  Dyslipidemia.  Sonographer:     NCresenciano LickRDCS Referring Phys:  [id number] OsmondDiagnosing Phys: BSerafina RoyalsMD IMPRESSIONS  1. Left ventricular ejection fraction, by estimation, is <20%. The left ventricle has  severely decreased function. The left ventricle demonstrates global hypokinesis. The left ventricular internal cavity size was severely dilated. There is mild left ventricular hypertrophy. Left ventricular diastolic parameters are consistent with Grade II diastolic dysfunction (pseudonormalization).  2. Right ventricular systolic function is normal. The right ventricular size is normal.  3. Left atrial size was severely dilated.  4. Right atrial size was moderately dilated.  5. The mitral valve is degenerative. Severe mitral valve regurgitation.  6. Tricuspid valve regurgitation is moderate to severe.  7. The aortic valve is normal in structure. Aortic valve regurgitation is mild. FINDINGS  Left Ventricle: Left ventricular ejection fraction, by estimation, is <20%. The left ventricle has severely decreased function. The left ventricle demonstrates global hypokinesis. Definity contrast agent was given IV to delineate the left ventricular endocardial borders. The left ventricular internal cavity size was severely dilated. There is mild left ventricular hypertrophy. Left ventricular diastolic parameters are consistent with Grade II diastolic dysfunction (pseudonormalization). Right Ventricle: The right ventricular size is normal. No increase in right ventricular wall thickness. Right ventricular systolic function is normal.  Left Atrium: Left atrial size was severely dilated. Right Atrium: Right atrial size was moderately dilated. Pericardium: There is no evidence of pericardial effusion. Mitral Valve: The mitral valve is degenerative in appearance. Severe mitral valve regurgitation. Tricuspid Valve: The tricuspid valve is normal in structure. Tricuspid valve regurgitation is moderate to severe. Aortic Valve: The aortic valve is normal in structure. Aortic valve regurgitation is mild. Aortic valve mean gradient measures 4.8 mmHg. Aortic valve peak gradient measures 8.6 mmHg. Aortic valve area, by VTI measures 1.45 cm. Pulmonic Valve: The pulmonic valve was normal in structure. Pulmonic valve regurgitation is mild. Aorta: The aortic root and ascending aorta are structurally normal, with no evidence of dilitation. IAS/Shunts: No atrial level shunt detected by color flow Doppler.  LEFT VENTRICLE PLAX 2D LVIDd:         6.30 cm LVIDs:         5.00 cm LV PW:         0.80 cm LV IVS:        0.80 cm LVOT diam:     2.30 cm LV SV:         40 LV SV Index:   20 LVOT Area:     4.15 cm  RIGHT VENTRICLE            IVC RV Basal diam:  5.20 cm    IVC diam: 2.20 cm RV S prime:     5.78 cm/s TAPSE (M-mode): 1.5 cm LEFT ATRIUM             Index        RIGHT ATRIUM           Index LA diam:        4.70 cm 2.31 cm/m   RA Area:     24.50 cm LA Vol (A2C):   85.9 ml 42.15 ml/m  RA Volume:   87.90 ml  43.14 ml/m LA Vol (A4C):   88.6 ml 43.48 ml/m LA Biplane Vol: 89.3 ml 43.82 ml/m  AORTIC VALVE AV Area (Vmax):    1.42 cm AV Area (Vmean):   1.25 cm AV Area (VTI):     1.45 cm AV Vmax:           146.40 cm/s AV Vmean:          99.860 cm/s AV VTI:            0.278  m AV Peak Grad:      8.6 mmHg AV Mean Grad:      4.8 mmHg LVOT Vmax:         49.90 cm/s LVOT Vmean:        30.150 cm/s LVOT VTI:          0.097 m LVOT/AV VTI ratio: 0.35  AORTA Ao Root diam: 3.70 cm Ao Asc diam:  4.20 cm MITRAL VALVE               TRICUSPID VALVE MV Area (PHT): 5.81 cm    TR Peak  grad:   23.2 mmHg MV Decel Time: 131 msec    TR Vmax:        241.00 cm/s MV E velocity: 84.10 cm/s MV A velocity: 35.10 cm/s  SHUNTS MV E/A ratio:  2.40        Systemic VTI:  0.10 m                            Systemic Diam: 2.30 cm Serafina Royals MD Electronically signed by Serafina Royals MD Signature Date/Time: 10/31/2022/9:20:54 AM    Final    DG Chest Portable 1 View  Result Date: 10/30/2022 CLINICAL DATA:  Shortness of breath, hypoxia EXAM: PORTABLE CHEST 1 VIEW COMPARISON:  Previous studies including the examination of 10/27/2022 FINDINGS: Transverse diameter of heart is increased. Central pulmonary vessels are prominent. There are no signs of alveolar pulmonary edema. There is no focal pulmonary consolidation. There is no pleural effusion or pneumothorax. There is previous coronary bypass surgery. Pacemaker/defibrillator battery is seen in left infraclavicular region with tips of leads in right atrium and right ventricle. IMPRESSION: Cardiomegaly. Central pulmonary vessels are prominent suggesting mild CHF. No focal pulmonary consolidation is seen. There is no significant pleural effusion. Electronically Signed   By: Elmer Picker M.D.   On: 10/30/2022 16:10   US ABDOMEN LIMITED RUQ (LIVER/GB)  Result Date: 10/27/2022 CLINICAL DATA:  Right upper quadrant pain EXAM: ULTRASOUND ABDOMEN LIMITED RIGHT UPPER QUADRANT COMPARISON:  Same day CT FINDINGS: Gallbladder: No gallstones or wall thickening visualized. No sonographic Murphy sign noted by sonographer. Common bile duct: Diameter: 4 mm. Liver: No focal lesion identified. Within normal limits in parenchymal echogenicity. Portal vein is patent on color Doppler imaging with normal direction of blood flow towards the liver. Other: None. IMPRESSION: Unremarkable right upper quadrant ultrasound. Electronically Signed   By: Davina Poke D.O.   On: 10/27/2022 10:40   CT ABDOMEN PELVIS WO CONTRAST  Result Date: 10/27/2022 CLINICAL DATA:  Nausea and  dizziness with abdomen pain. EXAM: CT ABDOMEN AND PELVIS WITHOUT CONTRAST TECHNIQUE: Multidetector CT imaging of the abdomen and pelvis was performed following the standard protocol without IV contrast. RADIATION DOSE REDUCTION: This exam was performed according to the departmental dose-optimization program which includes automated exposure control, adjustment of the mA and/or kV according to patient size and/or use of iterative reconstruction technique. COMPARISON:  November 27, 2019 FINDINGS: Lower chest: Minimal scarring atelectasis of bilateral lung bases are noted. The heart size is enlarged. Hepatobiliary: No focal liver abnormality is seen. No gallstones, gallbladder wall thickening, or biliary dilatation. Pancreas: Unremarkable. No pancreatic ductal dilatation or surrounding inflammatory changes. Small focal calcification identified in the pancreatic head unchanged compared prior exam. Spleen: Calcified granulomas identified in the spleen. The spleen is normal in size. Adrenals/Urinary Tract: The left kidney is atrophic. Stable 1.2 cm right kidney cyst is identified, best seen  on coronal view image 51 series 5. No follow-up is recommended. There is no hydronephrosis bilaterally. The bilateral adrenal glands are normal. The bladder is partially decompressed without gross abnormality. Stomach/Bowel: Stomach is within normal limits. The appendix is not seen but no inflammation is noted around cecum. No evidence of bowel wall thickening, distention, or inflammatory changes. Diverticulosis of colon. Vascular/Lymphatic: Atherosclerosis of the aorta. Status post stent graft repair of the infrarenal aortic aneurysm. Aneurysm measures 3.4 cm on current exam unchanged. No abdominal or pelvic lymphadenopathy is noted. Reproductive: Prostate is unremarkable. Other: Right inguinal herniation of mesenteric fat is noted. Musculoskeletal: Degenerative joint changes of the spine noted. IMPRESSION: 1. No acute abnormality  identified in the abdomen and pelvis. 2. Status post stent graft repair of the infrarenal aortic aneurysm. Aneurysm measures 3.4 cm on current exam unchanged. 3. Aortic atherosclerosis. Aortic Atherosclerosis (ICD10-I70.0). Electronically Signed   By: Abelardo Diesel M.D.   On: 10/27/2022 08:38   DG Chest 2 View  Result Date: 10/27/2022 CLINICAL DATA:  Shortness of breath EXAM: CHEST - 2 VIEW COMPARISON:  October 15, 2022 FINDINGS: The heart size and mediastinal contours are stable. Heart size is enlarged. Cardiac pacemaker is unchanged. Mild increased pulmonary interstitium is identified bilaterally. No focal pneumonia or pleural effusion is noted. The visualized skeletal structures are stable. IMPRESSION: Mild congestive heart failure. Electronically Signed   By: Abelardo Diesel M.D.   On: 10/27/2022 08:26   CT HEAD WO CONTRAST (5MM)  Result Date: 10/27/2022 CLINICAL DATA:  Mental status change with unknown cause. EXAM: CT HEAD WITHOUT CONTRAST TECHNIQUE: Contiguous axial images were obtained from the base of the skull through the vertex without intravenous contrast. RADIATION DOSE REDUCTION: This exam was performed according to the departmental dose-optimization program which includes automated exposure control, adjustment of the mA and/or kV according to patient size and/or use of iterative reconstruction technique. COMPARISON:  12/07/2017.  CTA of the neck 07/03/2022. FINDINGS: Brain: No evidence of acute infarction, hemorrhage, hydrocephalus, extra-axial collection or mass lesion/mass effect. 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 Small remote right cerebellar infarct. Generalized atrophy. Vascular: No hyperdense vessel or unexpected calcification. Skull: Remote left calvarial fracture with healed mild depression. Sinuses/Orbits: Extensive paranasal sinus disease with medial bulging of the medial maxillary sinus walls, progressed from 2019. Essentially all of the paranasal sinuses are  involved. Bilateral cataract resection. No evidence of orbital inflammation. IMPRESSION: 1. No acute finding. 2. Atrophy and remote left occipital infarct. 3. Advanced generalized sinusitis. Electronically Signed   By: Jorje Guild M.D.   On: 10/27/2022 08:21   DG Chest 2 View  Result Date: 10/15/2022 CLINICAL DATA:  Shortness of breath and orthopnea. EXAM: CHEST - 2 VIEW COMPARISON:  08/09/2020 FINDINGS: There is a left chest wall ICD with leads in the right atrial appendage and right ventricle. Previous median sternotomy and CABG procedure. Stable cardiac enlargement. No pleural effusion or interstitial edema. Mild coarsened interstitial markings appear similar. Visualized osseous structures are notable for remote healed right clavicle fracture. IMPRESSION: 1. No acute cardiopulmonary abnormalities. 2. Stable cardiac enlargement. Electronically Signed   By: Kerby Moors M.D.   On: 10/15/2022 10:58    EKG: AV pacing  Weights: There were no vitals filed for this visit.   Physical Exam: Blood pressure (!) 119/98, pulse 69, temperature 97.7 F (36.5 C), temperature source Oral, resp. rate 18, SpO2 98 %. There is no height or weight on file to calculate BMI. General: Well developed, well nourished, in no  acute distress. Head eyes ears nose throat: Normocephalic, atraumatic, sclera non-icteric, no xanthomas, nares are without discharge. No apparent thyromegaly and/or mass  Lungs: Normal respiratory effort.  no wheezes, few rales, no rhonchi.  Heart: RRR with normal S1 S2. no murmur gallop, no rub, PMI is normal size and placement, carotid upstroke normal without bruit, jugular venous pressure is normal Abdomen: Soft, non-tender, non-distended with normoactive bowel sounds. No hepatomegaly. No rebound/guarding. No obvious abdominal masses. Abdominal aorta is normal size without bruit Extremities: Trace edema. no cyanosis, no clubbing, no ulcers  Peripheral : 2+ bilateral upper extremity pulses,  2+ bilateral femoral pulses, 2+ bilateral dorsal pedal pulse Neuro: Alert and oriented. No facial asymmetry. No focal deficit. Moves all extremities spontaneously. Musculoskeletal: Normal muscle tone without kyphosis Psych:  Responds to questions appropriately with a normal affect.    Assessment: 81 year old male with acute on chronic systolic dysfunction congestive heart failure most likely secondary to acute on chronic kidney injury and fluid retention with LV systolic dysfunction no current evidence of acute coronary syndrome and/or myocardial infarction  Plan: 1.  Intravenous Lasix for acute on chronic systolic dysfunction congestive heart failure and acute on chronic kidney injury 2.  Continuation of previous medication management as best possible for cardiomyopathy including angiotensin receptor blocker beta-blocker as able 3.  High intensity cholesterol therapy 4.  Continuation of Eliquis for further risk reduction of stroke with atrial fibrillation 5.  No further cardiac diagnostics necessary at this time due to known cardiomyopathy and ejection fraction of 25% with no evidence of acute myocardial infarction 6.  Further rehabilitation and treatment options after above  Signed, Corey Skains M.D. [facility] 10/31/2022, 2:36 PM

## 2022-10-31 NOTE — Consult Note (Addendum)
   Heart Failure Nurse Navigator Note  HFrEF 20%.  Left ventricular internal cavity is severely dilated.  Grade 2 diastolic dysfunction.  Severe left atrial enlargement.  Moderate right atrial enlargement.  Severe mitral regurgitation.  Moderate to severe tricuspid regurgitation.  Mild aortic insufficiency.  He presented to the emergency room with complaints of dyspnea on exertion and orthopnea x 7 days.  BNP was 554 chest x-ray revealed central pulmonary congestion.  Comorbidities:  Coronary artery disease with stenting ICD placement Type 2 diabetes Chronic kidney disease stage III Gout Bladder cancer Abdominal aortic aneurysm History of CVA Ischemic cardiomyopathy Ventricular fibrillation Atrial for fibrillation on NOAC  Medications:  Apixaban 5 mg twice a day Aspirin 81 mg daily Atorvastatin 40 mg daily Carvedilol 6.25 mg twice a day Losartan 25 mg daily  Labs:  Sodium 140, potassium 5.6, chloride 108, CO2 24, BUN 91, creatinine 4.83 up from 4.37 of yesterday, D-dimer 2.52 Lactic acid 2.8 Troponin 30. Blood pressure 129/95  Initial meeting with patient in the ED.  There were no family members present.  Discussed heart failure and what it means.  He states that he knows that his heart only works at about 20%.  Discussed how he takes care of himself at home.  He does not weigh himself on a daily basis.  Explained  the rationale behind daily weights and reporting a 2 pound weight gain overnight or total of 5 pounds within the week.  He voices understanding.   Went over his diet, he states that he will eat a whole can of soup for lunch.  Explained to 2000 mg sodium limit daily and what is contained in just 1 serving of soup that he is eating 2-1/2 times that amount if he eats the whole can, which he states he does. Also admits to eating hot dogs- will eat two at a meal, made aware of the sodium content.  And over diet explaining eating foods in their natural state along with  eating meats.  Abstaining from convenience/processed foods.  He voices understanding.  Also discussed fluid restriction, patient states that he did not feel he drank over 32 ounces daily.  Also made aware of his appointment in the outpatient heart failure clinic on December 18 at 1030.  He has a 6% no-show which is 6 out of 100 appointments.  He was given the living with heart failure teaching booklet, zone magnet, info on heart failure and sodium along with weight chart.  Will continue to follow.  Pricilla Riffle RN [facility]

## 2022-10-31 NOTE — Progress Notes (Signed)
OT Cancellation Note  Patient Details Name: Taylor [name] MRN: [id number] DOB: 1941-04-19   Cancelled Treatment:    Reason Eval/Treat Not Completed: OT screened, no needs identified, will sign off. Order received, chart reviewed. Per conversation with PT, pt back to baseline functional independence. No skilled OT needs identified. Will sign off. Please re-consult if additional needs arise.   Dessie Coma, M.S. OTR/L  10/31/22, 11:45 AM  ascom [phone]

## 2022-11-01 DIAGNOSIS — N179 Acute kidney failure, unspecified: Secondary | ICD-10-CM | POA: Diagnosis not present

## 2022-11-01 DIAGNOSIS — R0600 Dyspnea, unspecified: Secondary | ICD-10-CM | POA: Diagnosis not present

## 2022-11-01 DIAGNOSIS — R0902 Hypoxemia: Secondary | ICD-10-CM | POA: Diagnosis not present

## 2022-11-01 DIAGNOSIS — N1832 Chronic kidney disease, stage 3b: Secondary | ICD-10-CM

## 2022-11-01 DIAGNOSIS — R7989 Other specified abnormal findings of blood chemistry: Secondary | ICD-10-CM | POA: Diagnosis not present

## 2022-11-01 LAB — CBC
HCT: 38.2 % — ABNORMAL LOW (ref 39.0–52.0)
Hemoglobin: 12.4 g/dL — ABNORMAL LOW (ref 13.0–17.0)
MCH: 32.5 pg (ref 26.0–34.0)
MCHC: 32.5 g/dL (ref 30.0–36.0)
MCV: 100 fL (ref 80.0–100.0)
Platelets: 120 10*3/uL — ABNORMAL LOW (ref 150–400)
RBC: 3.82 MIL/uL — ABNORMAL LOW (ref 4.22–5.81)
RDW: 16.2 % — ABNORMAL HIGH (ref 11.5–15.5)
WBC: 6 10*3/uL (ref 4.0–10.5)
nRBC: 0 % (ref 0.0–0.2)

## 2022-11-01 LAB — BASIC METABOLIC PANEL
Anion gap: 7 (ref 5–15)
BUN: 88 mg/dL — ABNORMAL HIGH (ref 8–23)
CO2: 24 mmol/L (ref 22–32)
Calcium: 8.5 mg/dL — ABNORMAL LOW (ref 8.9–10.3)
Chloride: 108 mmol/L (ref 98–111)
Creatinine, Ser: 3.94 mg/dL — ABNORMAL HIGH (ref 0.61–1.24)
GFR, Estimated: 15 mL/min — ABNORMAL LOW (ref >60)
Glucose, Bld: 119 mg/dL — ABNORMAL HIGH (ref 70–99)
Potassium: 4.2 mmol/L (ref 3.5–5.1)
Sodium: 139 mmol/L (ref 135–145)

## 2022-11-01 LAB — GLUCOSE, CAPILLARY: Glucose-Capillary: 137 mg/dL — ABNORMAL HIGH (ref 70–99)

## 2022-11-01 MED ORDER — ISOSORBIDE MONONITRATE ER 30 MG PO TB24
15.0000 mg | ORAL_TABLET | Freq: Every day | ORAL | Status: DC
  Administered 2022-11-01 – 2022-11-02 (×2): 15 mg via ORAL
  Filled 2022-11-01 (×2): qty 1

## 2022-11-01 MED ORDER — APIXABAN 2.5 MG PO TABS
2.5000 mg | ORAL_TABLET | Freq: Two times a day (BID) | ORAL | Status: DC
  Administered 2022-11-01 – 2022-11-02 (×2): 2.5 mg via ORAL
  Filled 2022-11-01 (×2): qty 1

## 2022-11-01 NOTE — Progress Notes (Signed)
[facility]  ROUNDING NOTE   Subjective:   Patient seen sitting at side of the bed, completed breakfast tray at bedside Alert and oriented Patient remains on room air Denies nausea and vomiting No lower extremity edema  Objective:  Vital signs in last 24 hours:  Temp:  [97.7 F (36.5 C)-98.1 F (36.7 C)] 98 F (36.7 C) (12/08 1154) Pulse Rate:  [60-76] 63 (12/08 1154) Resp:  [16-20] 20 (12/08 1154) BP: (117-146)/(76-98) 129/88 (12/08 1154) SpO2:  [97 %-100 %] 98 % (12/08 1154) Weight:  [83.5 kg-85.5 kg] 83.5 kg (12/08 0500)  Weight change:  Filed Weights   10/31/22 2010 11/01/22 0500  Weight: 85.5 kg 83.5 kg    Intake/Output: I/O last 3 completed shifts: In: 596.6 [I.V.:596.6] Out: 1300 [Urine:1300]   Intake/Output this shift:  Total I/O In: 360 [P.O.:360] Out: 1000 [Urine:1000]  Physical Exam: General: NAD  Head: Normocephalic, atraumatic. Moist oral mucosal membranes  Eyes: Anicteric  Lungs:  Clear to auscultation, normal effort, room air  Heart: Regular rate and rhythm  Abdomen:  Soft, nontender, mild distention  Extremities: No peripheral edema.  Neurologic: Nonfocal, moving all four extremities  Skin: No lesions  Access: None    Basic Metabolic Panel: Recent Labs  Lab 10/27/22 0705 10/30/22 1540 10/30/22 1844 10/31/22 0419 10/31/22 1429 11/01/22 0504  NA 139 138  --  140 137 139  K 3.6 5.3*  --  5.6* 4.4 4.2  CL 105 107  --  108 104 108  CO2 23 22  --  '24 22 24  '$ GLUCOSE 173* 130*  --  103* 120* 119*  BUN 57* 80*  --  91* 89* 88*  CREATININE 2.93* 4.28* 4.37* 4.83* 4.55* 3.94*  CALCIUM 9.0 9.1  --  8.9 8.6* 8.5*    Liver Function Tests: Recent Labs  Lab 10/27/22 0705 10/30/22 1540  AST 24 48*  ALT 17 28  ALKPHOS 131* 114  BILITOT 1.7* 2.1*  PROT 7.2 7.2  ALBUMIN 3.5 3.5   Recent Labs  Lab 10/27/22 0705  LIPASE 36   No results for input(s): "AMMONIA" in the last 168 hours.  CBC: Recent Labs  Lab  10/27/22 0705 10/30/22 1540 10/31/22 0419 11/01/22 0504  WBC 6.2 5.0 6.1 6.0  NEUTROABS  --  3.0  --   --   HGB 12.4* 13.2 12.4* 12.4*  HCT 38.6* 42.2 38.9* 38.2*  MCV 100.3* 102.9* 103.2* 100.0  PLT 137* 127* 125* 120*    Cardiac Enzymes: No results for input(s): "CKTOTAL", "CKMB", "CKMBINDEX", "TROPONINI" in the last 168 hours.  BNP: Invalid input(s): "POCBNP"  CBG: No results for input(s): "GLUCAP" in the last 168 hours.  Microbiology: Results for orders placed or performed during the hospital encounter of 10/27/22  Resp Panel by RT-PCR (Flu A&B, Covid) Anterior Nasal Swab     Status: None   Collection Time: 10/27/22 10:58 AM   Specimen: Anterior Nasal Swab  Result Value Ref Range Status   SARS Coronavirus 2 by RT PCR NEGATIVE NEGATIVE Final    Comment: (NOTE) SARS-CoV-2 target nucleic acids are NOT DETECTED.  The SARS-CoV-2 RNA is generally detectable in upper respiratory specimens during the acute phase of infection. The lowest concentration of SARS-CoV-2 viral copies this assay can detect is 138 copies/mL. A negative result does not preclude SARS-Cov-2 infection and should not be used as the sole basis for treatment or other patient management decisions. A negative result may occur with  improper specimen collection/handling, submission of specimen  other than nasopharyngeal swab, presence of viral mutation(s) within the areas targeted by this assay, and inadequate number of viral copies(<138 copies/mL). A negative result must be combined with clinical observations, patient history, and epidemiological information. The expected result is Negative.  Fact Sheet for Patients:  EntrepreneurPulse.com.au  Fact Sheet for Healthcare Providers:  IncredibleEmployment.be  This test is no t yet approved or cleared by the [place] FDA and  has been authorized for detection and/or diagnosis of SARS-CoV-2 by FDA under an Emergency Use  Authorization (EUA). This EUA will remain  in effect (meaning this test can be used) for the duration of the COVID-19 declaration under Section 564(b)(1) of the Act, 21 U.S.C.section 360bbb-3(b)(1), unless the authorization is terminated  or revoked sooner.       Influenza A by PCR NEGATIVE NEGATIVE Final   Influenza B by PCR NEGATIVE NEGATIVE Final    Comment: (NOTE) The Xpert Xpress SARS-CoV-2/FLU/RSV plus assay is intended as an aid in the diagnosis of influenza from Nasopharyngeal swab specimens and should not be used as a sole basis for treatment. Nasal washings and aspirates are unacceptable for Xpert Xpress SARS-CoV-2/FLU/RSV testing.  Fact Sheet for Patients: EntrepreneurPulse.com.au  Fact Sheet for Healthcare Providers: IncredibleEmployment.be  This test is not yet approved or cleared by the [place] FDA and has been authorized for detection and/or diagnosis of SARS-CoV-2 by FDA under an Emergency Use Authorization (EUA). This EUA will remain in effect (meaning this test can be used) for the duration of the COVID-19 declaration under Section 564(b)(1) of the Act, 21 U.S.C. section 360bbb-3(b)(1), unless the authorization is terminated or revoked.  Performed at [facility], [address] [place], [place] [address]     Coagulation Studies: No results for input(s): "LABPROT", "INR" in the last 72 hours.  Urinalysis: No results for input(s): "COLORURINE", "LABSPEC", "PHURINE", "GLUCOSEU", "HGBUR", "BILIRUBINUR", "KETONESUR", "PROTEINUR", "UROBILINOGEN", "NITRITE", "LEUKOCYTESUR" in the last 72 hours.  Invalid input(s): "APPERANCEUR"    Imaging: NM Pulmonary Perfusion  Result Date: 10/31/2022 CLINICAL DATA:  Pulmonary embolism suspected. Low to intermediate probability. Positive D-dimer. EXAM: NUCLEAR MEDICINE PERFUSION LUNG SCAN TECHNIQUE: Perfusion images were obtained in multiple projections after intravenous  injection of radiopharmaceutical. Ventilation scans intentionally deferred if perfusion scan and chest x-ray adequate for interpretation during COVID 19 epidemic. RADIOPHARMACEUTICALS:  3.61 mCi Tc-70mMAA IV COMPARISON:  No prior ventilation perfusion studies are available for comparison. Correlation is made with AP chest 10/30/2022 and chest two views 10/15/2022 FINDINGS: Perfusion only modified PIOPED II protocol. Cardiomegaly is again noted. There is mild heterogeneity of the perfusion radiotracer bilaterally. No large segmental perfusion defect is seen. IMPRESSION: Very low probability for pulmonary embolism. Electronically Signed   By: RYvonne KendallM.D.   On: 10/31/2022 13:38   ECHOCARDIOGRAM COMPLETE  Result Date: 10/31/2022    ECHOCARDIOGRAM REPORT   Patient Name:   SJETT FUKUDADate of Exam: 10/30/2022 Medical Rec #:  [id number]   Height:       70.0 in Accession #:    [id number]  Weight:       189.0 lb Date of Birth:  1Jun 23, 1942  BSA:          2.038 m Patient Age:    89years     BP:           119/85 mmHg Patient Gender: M            HR:  75 bpm. Exam Location:  [facility] Procedure: 2D Echo, Cardiac Doppler, Color Doppler and Intracardiac            Opacification Agent Indications:     I50.9 Congestive heart failure  History:         Patient has no prior history of Echocardiogram examinations.                  Previous Myocardial Infarction and CAD, Defibrillator, Stroke,                  Signs/Symptoms:Dyspnea and Edema; Risk Factors:Hypertension and                  Dyslipidemia.  Sonographer:     Cresenciano Lick RDCS Referring Phys:  [id number] Richarda Osmond Diagnosing Phys: Serafina Royals MD IMPRESSIONS  1. Left ventricular ejection fraction, by estimation, is <20%. The left ventricle has severely decreased function. The left ventricle demonstrates global hypokinesis. The left ventricular internal cavity size was severely dilated. There is mild left ventricular hypertrophy. Left  ventricular diastolic parameters are consistent with Grade II diastolic dysfunction (pseudonormalization).  2. Right ventricular systolic function is normal. The right ventricular size is normal.  3. Left atrial size was severely dilated.  4. Right atrial size was moderately dilated.  5. The mitral valve is degenerative. Severe mitral valve regurgitation.  6. Tricuspid valve regurgitation is moderate to severe.  7. The aortic valve is normal in structure. Aortic valve regurgitation is mild. FINDINGS  Left Ventricle: Left ventricular ejection fraction, by estimation, is <20%. The left ventricle has severely decreased function. The left ventricle demonstrates global hypokinesis. Definity contrast agent was given IV to delineate the left ventricular endocardial borders. The left ventricular internal cavity size was severely dilated. There is mild left ventricular hypertrophy. Left ventricular diastolic parameters are consistent with Grade II diastolic dysfunction (pseudonormalization). Right Ventricle: The right ventricular size is normal. No increase in right ventricular wall thickness. Right ventricular systolic function is normal. Left Atrium: Left atrial size was severely dilated. Right Atrium: Right atrial size was moderately dilated. Pericardium: There is no evidence of pericardial effusion. Mitral Valve: The mitral valve is degenerative in appearance. Severe mitral valve regurgitation. Tricuspid Valve: The tricuspid valve is normal in structure. Tricuspid valve regurgitation is moderate to severe. Aortic Valve: The aortic valve is normal in structure. Aortic valve regurgitation is mild. Aortic valve mean gradient measures 4.8 mmHg. Aortic valve peak gradient measures 8.6 mmHg. Aortic valve area, by VTI measures 1.45 cm. Pulmonic Valve: The pulmonic valve was normal in structure. Pulmonic valve regurgitation is mild. Aorta: The aortic root and ascending aorta are structurally normal, with no evidence of  dilitation. IAS/Shunts: No atrial level shunt detected by color flow Doppler.  LEFT VENTRICLE PLAX 2D LVIDd:         6.30 cm LVIDs:         5.00 cm LV PW:         0.80 cm LV IVS:        0.80 cm LVOT diam:     2.30 cm LV SV:         40 LV SV Index:   20 LVOT Area:     4.15 cm  RIGHT VENTRICLE            IVC RV Basal diam:  5.20 cm    IVC diam: 2.20 cm RV S prime:     5.78 cm/s TAPSE (M-mode): 1.5 cm LEFT ATRIUM  Index        RIGHT ATRIUM           Index LA diam:        4.70 cm 2.31 cm/m   RA Area:     24.50 cm LA Vol (A2C):   85.9 ml 42.15 ml/m  RA Volume:   87.90 ml  43.14 ml/m LA Vol (A4C):   88.6 ml 43.48 ml/m LA Biplane Vol: 89.3 ml 43.82 ml/m  AORTIC VALVE AV Area (Vmax):    1.42 cm AV Area (Vmean):   1.25 cm AV Area (VTI):     1.45 cm AV Vmax:           146.40 cm/s AV Vmean:          99.860 cm/s AV VTI:            0.278 m AV Peak Grad:      8.6 mmHg AV Mean Grad:      4.8 mmHg LVOT Vmax:         49.90 cm/s LVOT Vmean:        30.150 cm/s LVOT VTI:          0.097 m LVOT/AV VTI ratio: 0.35  AORTA Ao Root diam: 3.70 cm Ao Asc diam:  4.20 cm MITRAL VALVE               TRICUSPID VALVE MV Area (PHT): 5.81 cm    TR Peak grad:   23.2 mmHg MV Decel Time: 131 msec    TR Vmax:        241.00 cm/s MV E velocity: 84.10 cm/s MV A velocity: 35.10 cm/s  SHUNTS MV E/A ratio:  2.40        Systemic VTI:  0.10 m                            Systemic Diam: 2.30 cm Serafina Royals MD Electronically signed by Serafina Royals MD Signature Date/Time: 10/31/2022/9:20:54 AM    Final    DG Chest Portable 1 View  Result Date: 10/30/2022 CLINICAL DATA:  Shortness of breath, hypoxia EXAM: PORTABLE CHEST 1 VIEW COMPARISON:  Previous studies including the examination of 10/27/2022 FINDINGS: Transverse diameter of heart is increased. Central pulmonary vessels are prominent. There are no signs of alveolar pulmonary edema. There is no focal pulmonary consolidation. There is no pleural effusion or pneumothorax. There is previous  coronary bypass surgery. Pacemaker/defibrillator battery is seen in left infraclavicular region with tips of leads in right atrium and right ventricle. IMPRESSION: Cardiomegaly. Central pulmonary vessels are prominent suggesting mild CHF. No focal pulmonary consolidation is seen. There is no significant pleural effusion. Electronically Signed   By: Elmer Picker M.D.   On: 10/30/2022 16:10     Medications:     allopurinol  150 mg Oral Daily   apixaban  2.5 mg Oral BID   aspirin EC  81 mg Oral Daily   atorvastatin  40 mg Oral Daily   carvedilol  6.25 mg Oral BID WC   isosorbide mononitrate  15 mg Oral Daily   pantoprazole  40 mg Oral Daily   acetaminophen **OR** acetaminophen, bisacodyl, ondansetron **OR** ondansetron (ZOFRAN) IV, polyethylene glycol, triamcinolone ointment  Assessment/ Plan:  [name] is a 81 y.o.  male with medical problems of medical problems of heart failure with reduced EF, Barrett's esophagus, coronary artery disease status post stent, AICD, chronic kidney disease, degenerative disc disease, gout,  bladder cancer history, hyperlipidemia, psoriasis, AAA, history of stroke, hypertension, restrictive lung disease, history of pancreatitis, atrial fibrillation, diabetes type 2, carotid stenosis, obstructive sleep apnea was admitted on 10/30/2022 for Hypoxia [R09.02] Congestive heart failure (CHF) ([place]) [I50.9] AKI (acute kidney injury) ([place]) [N17.9] Elevated d-dimer [R79.89] Dyspnea, unspecified type [R06.00]   1.  Acute kidney injury appears secondary to cardiorenal syndrome 2.  Acute on chronic systolic and diastolic dysfunction.  Per chart, heart failure with reduced EF 20%, severely dilated left ventricular internal cavity, grade 2 diastolic dysfunction, severe left atrial enlargement, moderate right atrial enlargement, severe mitral regurgitation, moderate to severe tricuspid regurgitation, mild aortic insufficiency. 3.  Chronic kidney disease stage IIIb.   Baseline creatinine 2.1/GFR 32 from 07/23/2022. 4.  Diabetes type 2 with nephropathy.   Lab Results  Component Value Date   CREATININE 3.94 (H) 11/01/2022   CREATININE 4.55 (H) 10/31/2022   CREATININE 4.83 (H) 10/31/2022    Intake/Output Summary (Last 24 hours) at 11/01/2022 1514 Last data filed at 11/01/2022 1349 Gross per 24 hour  Intake 956.57 ml  Output 1900 ml  Net -943.43 ml   Urinalysis negative for blood or WBCs from December 3. Mild proteinuria noted. Imaging negative for obstruction on December 3.   Plan: Creatinine improved with adequate urine output noted, 1.3 L in preceding 24 hours.  Avoid nephrotoxic agents and therapies including hypotension.  Cardiology following and assisting with management failure treatments.  No need for dialysis at this time.  Will continue to monitor.    LOS: 2 Theresea Trautmann 12/8/20233:14 PM

## 2022-11-01 NOTE — Progress Notes (Addendum)
PROGRESS NOTE  Taylor [name]    DOB: Oct 16, 1941, 81 y.o.  BUL:[id number]    Code Status: Full Code   DOA: 10/30/2022   LOS: 2   Brief hospital course  Taylor [name] is a 81 y.o. Taylor with a PMH significant for HFrEF, Barrett esophagus, CAD s/p stent placement, s/p AICD, CKDIII, DDD, gout, bladder cancer, HLD, pacemaker placed, psoriasis, AAA, h/o CVA, HTN, ischemic cardiomyopathy, h/o ventricular fibrillation, restrictive lung disease, pleural nodule, h/o pancreatitis, Afib (on eliquis), Type II DM, carotid stenosis. At baseline, they live at home with their wife and are independent with ADLs.   They presented from home to the ED on 10/30/2022 with DOE and orthopnea x 7 days. It acutely worsened today and he is unable to lay flat.  - He had been seen in ED 12/3 for same and prescribed doxycycline for sinusitis.  - He had been evaluated by his primary care doctor on 12/4 and diagnosed with a URVI and they increased his torsemide to TID.    Upon arrival to ED, he states that he has difficulty ambulating across the room due to SOB which is much worse than his baseline. He has only mild symptoms while resting but is unable to lay flat as this causes his breathing to acutely worsen. States he has been taking his torsemide '20mg'$  TID as directed since seeing his PCP. Denies any LE swelling.  Denies cough, chest pain, diaphoresis, syncope. Denies any changes in his bowel or urine habits. Did not notice increased urine output with his increase frequency of torsemide.   In the ED, it was found that they had stable vital signs ORA. Labs showed negative covid or flu. BNP elevated to 554.5 which is above his baseline. Troponin 30 which is about his baseline. K+ 5.3, LA 2.8, CBC at baseline,  ECG shows possible RBBB not seen previously. Paced rhythm. No ST changes. Significant findings included: Of note, Cr was 4.28 from a baseline around 2.5, D-dimer 2.52 Chest xray showed cardiomegaly and central vascular  congestion.  VQ scan pending.   They were initially treated with IV lasix '60mg'$ .   12/7- Cr worsened with diuretics so discontinued and put on gentle fluids in setting of HF. Nephrology consulted for AKI on CKDIIIb.  Echo resulted today showing severe valvular disease not shown on prior study 2021. Cardiology consulted to evaluate for possible replacement.  11/01/22 -  Assessment & Plan  Principal Problem:   Congestive heart failure (CHF) (HCC) Active Problems:   CAD S/P percutaneous coronary angioplasty   Elevated d-dimer   Abdominal aortic aneurysm (AAA) (HCC)   AKI (acute kidney injury) ([place])   Dyspnea   Mitral valve insufficiency   HFrEF (heart failure with reduced ejection fraction) (HCC)   Acute on chronic respiratory failure with hypoxia (HCC)  SOB  CHF exacerbation- clinically, patient feels improved. Desats to 80s while supine so put on 2L O2. Dry on exam and had Cr bump so stopped diuretics.  V/Q scan negative PE.  Based on echo results, likely cause of recurrent/persistent dyspnea is poor heart function including severe valvular disease Ultimately, patient is a precarious middle ground of needing diuresis for his heart failure and causing damage to his kidney function with the HF treatment. Need to monitor and adjust treatment frequently as his needs change going forward. When he is stable ORA and kidney function is close to baseline, he will be ready for discharge but will need close follow up and diuretic adjustments by  cardiology and nephrology.  - cardiology consulted, appreciate recs - diuretics stopped and now on low amount of IV fluids. Will stop fluids today to see how he does for discharge planning.  - strict I/O, daily weights - continuous cardiac monitoring and pulse oximetry - recommend PFTs outpatient   AKI on CKD 3b- Cr elevated to 4.28>>4.83>3.94 on presentation from baseline around 2.5. s/p diuretics. Stopped diuretics, started very low amount of IV  fluids - consulted nephrology, appreciate recs.    K+ 5.3>5.6>>4.2 - BMP am - monitor and replete electrolytes PRN - hold home K+ for now   Type II DM- non insulin dependent - monitor blood sugars, add on sSSI if needed - holding home metformin   CAD s/p PCI  AAA  HFrEF  HTN  Afib-EF from 07/2020 was 35-40%. Repeat echo this admission showing E <20%, severe LV decreased function with global hypokinesis. RV normal. Severely dilated Left atrium, severe mitral valve regurgitation. Tricuspid regurg mod-severe. Aortic valve read as normal. Discussed with cardiology who does not think he will be interventional candidate for valvular clip/repair. He cannot tolerate the optimal medication therapy due to renal function. Will need close cardiology follow up - continuous cardiac monitoring - continue home medications including eliquis, atorvastatin, carvedilol - losartan held for AKI - cardiology consulted, appreciate recs   H/o CVA - continue ASA   Gout - continue home meds including allopurinol   Holding home nutritional supplements  Body mass index is 26.41 kg/m.  VTE ppx: SCDs Start: 10/30/22 1646 apixaban (ELIQUIS) tablet 5 mg   Diet:     Diet   Diet 2 gram sodium Fluid consistency: Thin   Consultants: Nephrology Cardiology   Subjective 11/01/22    Pt reports feeling improved with respiratory status. He can lay flat without dyspnea. Denies chest pain or LE edema   Objective   Vitals:   10/31/22 2354 11/01/22 0428 11/01/22 0500 11/01/22 0738  BP: 125/88 117/85  (!) 146/98  Pulse: 66 62  61  Resp: '20 18  20  '$ Temp: 97.7 F (36.5 C) 97.8 F (36.6 C)  98.1 F (36.7 C)  TempSrc: Oral   Axillary  SpO2: 97% 98%  100%  Weight:   83.5 kg   Height:        Intake/Output Summary (Last 24 hours) at 11/01/2022 0753 Last data filed at 11/01/2022 0550 Gross per 24 hour  Intake 596.57 ml  Output 1300 ml  Net -703.43 ml   Filed Weights   10/31/22 2010 11/01/22 0500   Weight: 85.5 kg 83.5 kg     Physical Exam:  General: awake, alert, NAD HEENT: atraumatic, clear conjunctiva, anicteric sclera, MMM, hard of hearing Respiratory: normal respiratory effort. CTAB Cardiovascular: quick capillary refill, normal S1/S2, RRR, no JVD, murmurs Nervous: A&O x3. no gross focal neurologic deficits, normal speech Extremities: moves all equally, no edema, normal tone Skin: dry, intact, normal temperature, normal color. No rashes, lesions or ulcers on exposed skin Psychiatry: normal mood, congruent affect  Labs   I have personally reviewed the following labs and imaging studies CBC    Component Value Date/Time   WBC 6.0 11/01/2022 0504   RBC 3.82 (L) 11/01/2022 0504   HGB 12.4 (L) 11/01/2022 0504   HGB 13.2 10/15/2022 1014   HCT 38.2 (L) 11/01/2022 0504   HCT 39.8 10/15/2022 1014   PLT 120 (L) 11/01/2022 0504   PLT 158 10/15/2022 1014   MCV 100.0 11/01/2022 0504   MCV 97 10/15/2022 1014  MCH 32.5 11/01/2022 0504   MCHC 32.5 11/01/2022 0504   RDW 16.2 (H) 11/01/2022 0504   RDW 14.3 10/15/2022 1014   LYMPHSABS 1.4 10/30/2022 1540   LYMPHSABS 1.5 10/15/2022 1014   MONOABS 0.4 10/30/2022 1540   EOSABS 0.1 10/30/2022 1540   EOSABS 0.2 10/15/2022 1014   BASOSABS 0.1 10/30/2022 1540   BASOSABS 0.1 10/15/2022 1014      Latest Ref Rng & Units 11/01/2022    5:04 AM 10/31/2022    2:29 PM 10/31/2022    4:19 AM  BMP  Glucose 70 - 99 mg/dL 119  120  103   BUN 8 - 23 mg/dL 88  89  91   Creatinine 0.61 - 1.24 mg/dL 3.94  4.55  4.83   Sodium 135 - 145 mmol/L 139  137  140   Potassium 3.5 - 5.1 mmol/L 4.2  4.4  5.6   Chloride 98 - 111 mmol/L 108  104  108   CO2 22 - 32 mmol/L '24  22  24   '$ Calcium 8.9 - 10.3 mg/dL 8.5  8.6  8.9     NM Pulmonary Perfusion  Result Date: 10/31/2022 CLINICAL DATA:  Pulmonary embolism suspected. Low to intermediate probability. Positive D-dimer. EXAM: NUCLEAR MEDICINE PERFUSION LUNG SCAN TECHNIQUE: Perfusion images were obtained in  multiple projections after intravenous injection of radiopharmaceutical. Ventilation scans intentionally deferred if perfusion scan and chest x-ray adequate for interpretation during COVID 19 epidemic. RADIOPHARMACEUTICALS:  3.61 mCi Tc-72mMAA IV COMPARISON:  No prior ventilation perfusion studies are available for comparison. Correlation is made with AP chest 10/30/2022 and chest two views 10/15/2022 FINDINGS: Perfusion only modified PIOPED II protocol. Cardiomegaly is again noted. There is mild heterogeneity of the perfusion radiotracer bilaterally. No large segmental perfusion defect is seen. IMPRESSION: Very low probability for pulmonary embolism. Electronically Signed   By: RYvonne KendallM.D.   On: 10/31/2022 13:38   ECHOCARDIOGRAM COMPLETE  Result Date: 10/31/2022    ECHOCARDIOGRAM REPORT   Patient Name:   Taylor [name] of Exam: 10/30/2022 Medical Rec #:  [id number]   Height:       70.0 in Accession #:    [id number]  Weight:       189.0 lb Date of Birth:  111/03/1941  BSA:          2.038 m Patient Age:    89years     BP:           119/85 mmHg Patient Gender: M            HR:           75 bpm. Exam Location:  [facility] Procedure: 2D Echo, Cardiac Doppler, Color Doppler and Intracardiac            Opacification Agent Indications:     I50.9 Congestive heart failure  History:         Patient has no prior history of Echocardiogram examinations.                  Previous Myocardial Infarction and CAD, Defibrillator, Stroke,                  Signs/Symptoms:Dyspnea and Edema; Risk Factors:Hypertension and                  Dyslipidemia.  Sonographer:     NCresenciano LickRDCS Referring Phys:  [id number] OsmondDiagnosing Phys: BSerafina RoyalsMD IMPRESSIONS  1.  Left ventricular ejection fraction, by estimation, is <20%. The left ventricle has severely decreased function. The left ventricle demonstrates global hypokinesis. The left ventricular internal cavity size was severely dilated. There is mild  left ventricular hypertrophy. Left ventricular diastolic parameters are consistent with Grade II diastolic dysfunction (pseudonormalization).  2. Right ventricular systolic function is normal. The right ventricular size is normal.  3. Left atrial size was severely dilated.  4. Right atrial size was moderately dilated.  5. The mitral valve is degenerative. Severe mitral valve regurgitation.  6. Tricuspid valve regurgitation is moderate to severe.  7. The aortic valve is normal in structure. Aortic valve regurgitation is mild. FINDINGS  Left Ventricle: Left ventricular ejection fraction, by estimation, is <20%. The left ventricle has severely decreased function. The left ventricle demonstrates global hypokinesis. Definity contrast agent was given IV to delineate the left ventricular endocardial borders. The left ventricular internal cavity size was severely dilated. There is mild left ventricular hypertrophy. Left ventricular diastolic parameters are consistent with Grade II diastolic dysfunction (pseudonormalization). Right Ventricle: The right ventricular size is normal. No increase in right ventricular wall thickness. Right ventricular systolic function is normal. Left Atrium: Left atrial size was severely dilated. Right Atrium: Right atrial size was moderately dilated. Pericardium: There is no evidence of pericardial effusion. Mitral Valve: The mitral valve is degenerative in appearance. Severe mitral valve regurgitation. Tricuspid Valve: The tricuspid valve is normal in structure. Tricuspid valve regurgitation is moderate to severe. Aortic Valve: The aortic valve is normal in structure. Aortic valve regurgitation is mild. Aortic valve mean gradient measures 4.8 mmHg. Aortic valve peak gradient measures 8.6 mmHg. Aortic valve area, by VTI measures 1.45 cm. Pulmonic Valve: The pulmonic valve was normal in structure. Pulmonic valve regurgitation is mild. Aorta: The aortic root and ascending aorta are structurally  normal, with no evidence of dilitation. IAS/Shunts: No atrial level shunt detected by color flow Doppler.  LEFT VENTRICLE PLAX 2D LVIDd:         6.30 cm LVIDs:         5.00 cm LV PW:         0.80 cm LV IVS:        0.80 cm LVOT diam:     2.30 cm LV SV:         40 LV SV Index:   20 LVOT Area:     4.15 cm  RIGHT VENTRICLE            IVC RV Basal diam:  5.20 cm    IVC diam: 2.20 cm RV S prime:     5.78 cm/s TAPSE (M-mode): 1.5 cm LEFT ATRIUM             Index        RIGHT ATRIUM           Index LA diam:        4.70 cm 2.31 cm/m   RA Area:     24.50 cm LA Vol (A2C):   85.9 ml 42.15 ml/m  RA Volume:   87.90 ml  43.14 ml/m LA Vol (A4C):   88.6 ml 43.48 ml/m LA Biplane Vol: 89.3 ml 43.82 ml/m  AORTIC VALVE AV Area (Vmax):    1.42 cm AV Area (Vmean):   1.25 cm AV Area (VTI):     1.45 cm AV Vmax:           146.40 cm/s AV Vmean:          99.860 cm/s AV  VTI:            0.278 m AV Peak Grad:      8.6 mmHg AV Mean Grad:      4.8 mmHg LVOT Vmax:         49.90 cm/s LVOT Vmean:        30.150 cm/s LVOT VTI:          0.097 m LVOT/AV VTI ratio: 0.35  AORTA Ao Root diam: 3.70 cm Ao Asc diam:  4.20 cm MITRAL VALVE               TRICUSPID VALVE MV Area (PHT): 5.81 cm    TR Peak grad:   23.2 mmHg MV Decel Time: 131 msec    TR Vmax:        241.00 cm/s MV E velocity: 84.10 cm/s MV A velocity: 35.10 cm/s  SHUNTS MV E/A ratio:  2.40        Systemic VTI:  0.10 m                            Systemic Diam: 2.30 cm Serafina Royals MD Electronically signed by Serafina Royals MD Signature Date/Time: 10/31/2022/9:20:54 AM    Final    DG Chest Portable 1 View  Result Date: 10/30/2022 CLINICAL DATA:  Shortness of breath, hypoxia EXAM: PORTABLE CHEST 1 VIEW COMPARISON:  Previous studies including the examination of 10/27/2022 FINDINGS: Transverse diameter of heart is increased. Central pulmonary vessels are prominent. There are no signs of alveolar pulmonary edema. There is no focal pulmonary consolidation. There is no pleural effusion or  pneumothorax. There is previous coronary bypass surgery. Pacemaker/defibrillator battery is seen in left infraclavicular region with tips of leads in right atrium and right ventricle. IMPRESSION: Cardiomegaly. Central pulmonary vessels are prominent suggesting mild CHF. No focal pulmonary consolidation is seen. There is no significant pleural effusion. Electronically Signed   By: Elmer Picker M.D.   On: 10/30/2022 16:10    Disposition Plan & Communication  Patient status: Inpatient  Admitted From: Home Planned disposition location: Home Anticipated discharge date: 12/9 pending cardiac clearance, off oxygen, and renal improvement  Family Communication: none    Author: Richarda Osmond, DO [facility] 11/01/2022, 7:53 AM   Available by Epic secure chat 7AM-7PM. If 7PM-7AM, please contact night-coverage.  [facility] contact information found on [email or web address].

## 2022-11-01 NOTE — Progress Notes (Signed)
Physical Therapy Treatment Patient Details Name: Taylor [name] MRN: [id number] DOB: 06-19-1941 Today's Date: 11/01/2022   History of Present Illness Taylor [name] is a 81 y.o. male with a PMH significant for HFrEF, Barrett esophagus, CAD s/p stent placement, s/p AICD, CKDIII, DDD, gout, bladder cancer, HLD, pacemaker placed, psoriasis, AAA, h/o CVA, HTN, ischemic cardiomyopathy, h/o ventricular fibrillation, restrictive lung disease, pleural nodule, h/o pancreatitis, Afib (on eliquis), Type II DM, carotid stenosis.  At baseline, he lives at home with his wife and is independent with ADLs. He presented from home to the ED on 10/30/2022 with DOE and orthopnea x 7 days. It acutely worsened today and he is unable to lay flat.   - He had been seen in ED 12/3 for same and prescribed doxycycline for sinusitis.   - He had been evaluated by his primary care doctor on 12/4 and diagnosed with a URVI and they increased his torsemide to TID.    PT Comments    Pt found supine in bed upon PT entry. Pt independent with Bed mobility and Sit<>Stand. Pt ambulated 220 ft with supervision and tolerated vertical and horizontal head motions with gait well. O2 remained above 90% throughout session. Pt would benefit from skilled physical therapy to address the listed deficits (see below) to increase independence with ADLs and function. Current recommendation remains appropriate.      Recommendations for follow up therapy are one component of a multi-disciplinary discharge planning process, led by the attending physician.  Recommendations may be updated based on patient status, additional functional criteria and insurance authorization.  Follow Up Recommendations  No PT follow up     Assistance Recommended at Discharge PRN  Patient can return home with the following A little help with walking and/or transfers;A little help with bathing/dressing/bathroom;Help with stairs or ramp for entrance;Assistance with  cooking/housework   Equipment Recommendations  None recommended by PT    Recommendations for Other Services       Precautions / Restrictions Precautions Precautions: None Restrictions Weight Bearing Restrictions: No     Mobility  Bed Mobility Overal bed mobility: Independent                  Transfers Overall transfer level: Independent                      Ambulation/Gait Ambulation/Gait assistance: Supervision Gait Distance (Feet): 220 Feet Assistive device: None Gait Pattern/deviations: Step-through pattern Gait velocity: WNL     General Gait Details: Pt challenged with horizontal and vertical head motions during gait   Stairs             Wheelchair Mobility    Modified Rankin (Stroke Patients Only)       Balance Overall balance assessment: No apparent balance deficits (not formally assessed)                                          Cognition Arousal/Alertness: Awake/alert Behavior During Therapy: WFL for tasks assessed/performed Overall Cognitive Status: Within Functional Limits for tasks assessed                                          Exercises      General Comments        Pertinent  Vitals/Pain Pain Assessment Pain Assessment: No/denies pain    Home Living                          Prior Function            PT Goals (current goals can now be found in the care plan section) Acute Rehab PT Goals Patient Stated Goal: to return home PT Goal Formulation: With patient Time For Goal Achievement: 11/07/22 Potential to Achieve Goals: Good Progress towards PT goals: Progressing toward goals    Frequency    Min 2X/week      PT Plan Current plan remains appropriate    Co-evaluation              AM-PAC PT "6 Clicks" Mobility   Outcome Measure  Help needed turning from your back to your side while in a flat bed without using bedrails?: None Help needed  moving from lying on your back to sitting on the side of a flat bed without using bedrails?: None Help needed moving to and from a bed to a chair (including a wheelchair)?: None Help needed standing up from a chair using your arms (e.g., wheelchair or bedside chair)?: None Help needed to walk in hospital room?: A Little Help needed climbing 3-5 steps with a railing? : A Little 6 Click Score: 22    End of Session   Activity Tolerance: Patient tolerated treatment well Patient left: in chair;with call bell/phone within reach Nurse Communication: Mobility status PT Visit Diagnosis: Difficulty in walking, not elsewhere classified (R26.2)     Time: 2956-2130 PT Time Calculation (min) (ACUTE ONLY): 15 min  Charges:                        Claiborne Billings O'Daniel, SPT   11/01/2022, 3:55 PM

## 2022-11-01 NOTE — Progress Notes (Signed)
Nutrition Brief Note  RD consulted for assessment of nutritional requirements/ status.   Wt Readings from Last 15 Encounters:  11/01/22 83.5 kg  10/30/22 85.7 kg  10/28/22 85.3 kg  10/27/22 80.7 kg  10/21/22 84.4 kg  10/15/22 84.4 kg  10/10/22 83.2 kg  08/06/22 86.2 kg  07/23/22 82.1 kg  07/08/22 85.4 kg  06/25/22 87.3 kg  05/20/22 85.9 kg  05/07/22 88.9 kg  03/29/22 89.1 kg  03/26/22 88.2 kg   Pt with a PMH significant for HFrEF, Barrett esophagus, CAD s/p stent placement, s/p AICD, CKDIII, DDD, gout, bladder cancer, HLD, pacemaker placed, psoriasis, AAA, h/o CVA, HTN, ischemic cardiomyopathy, h/o ventricular fibrillation, restrictive lung disease, pleural nodule, h/o pancreatitis, Afib (on eliquis), Type II DM, carotid stenosis.   Pt admitted with CHF.   Reviewed I/O's: -703 ml x 24 hours  UOP: 1.3 L x 24 hours  Spoke with pt at bedside, who reports feeling well today. He shares he has a good appetite. He ate all of his breakfast this morning. Per pt, he usually consumes 1-2 meals per day, which consist of hamburgers, hot dogs, or barbeque. Pt admits to eating out a lot.   Pt shares that up until recently, he has been very active (activity halted due to SOB and a recent sinus infection). 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 He reports his UBW is around 200# and lost about 40# within the past month, which he attributes to increased physical activity. However, wt has been stable over the past few months.   CHF RN Navigator saw pt yesterday. Pt needed a lot of reinforcement on education. Discussed importance of sodium restriction and daily weights. Reviewed when ito call clinic. Pt expressed understanding.   Nutrition-Focused physical exam completed. Findings are no fat depletion, no muscle depletion, and no edema.    Current diet order is 2 gram sodium, patient is consuming approximately 100% of meals at this time. Labs and medications reviewed.   No  nutrition interventions warranted at this time. If nutrition issues arise, please consult RD.   Loistine Chance, RD, LDN, [place] Registered Dietitian II Certified Diabetes Care and Education Specialist Please refer to [facility] for RD and/or RD on-call/weekend/after hours pager

## 2022-11-01 NOTE — Progress Notes (Signed)
SATURATION QUALIFICATIONS: (This note is used to comply with regulatory documentation for home oxygen)  Patient Saturations on Room Air at Rest = 96%  Patient Saturations on Room Air while Ambulating = 88-91%   Pt did drop O2 sats to 88 while ambulating but upon rest immediately O2 sats returned to mid 90's.  Pt denied SOB and ambulated without assistance.

## 2022-11-01 NOTE — Care Management Important Message (Signed)
Important Message  Patient Details  Name: Taylor [name] MRN: [id number] Date of Birth: Apr 19, 1941   Medicare Important Message Given:  N/A - LOS <3 / Initial given by admissions     Dannette Barbara 11/01/2022, 10:00 AM

## 2022-11-02 ENCOUNTER — Other Ambulatory Visit: Payer: Self-pay | Admitting: Student in an Organized Health Care Education/Training Program

## 2022-11-02 DIAGNOSIS — N183 Chronic kidney disease, stage 3 unspecified: Secondary | ICD-10-CM

## 2022-11-02 LAB — BASIC METABOLIC PANEL
Anion gap: 8 (ref 5–15)
BUN: 71 mg/dL — ABNORMAL HIGH (ref 8–23)
CO2: 24 mmol/L (ref 22–32)
Calcium: 9 mg/dL (ref 8.9–10.3)
Chloride: 111 mmol/L (ref 98–111)
Creatinine, Ser: 2.85 mg/dL — ABNORMAL HIGH (ref 0.61–1.24)
GFR, Estimated: 22 mL/min — ABNORMAL LOW (ref >60)
Glucose, Bld: 99 mg/dL (ref 70–99)
Potassium: 4.1 mmol/L (ref 3.5–5.1)
Sodium: 143 mmol/L (ref 135–145)

## 2022-11-02 LAB — CBC
HCT: 39.5 % (ref 39.0–52.0)
Hemoglobin: 12.5 g/dL — ABNORMAL LOW (ref 13.0–17.0)
MCH: 31.8 pg (ref 26.0–34.0)
MCHC: 31.6 g/dL (ref 30.0–36.0)
MCV: 100.5 fL — ABNORMAL HIGH (ref 80.0–100.0)
Platelets: 120 10*3/uL — ABNORMAL LOW (ref 150–400)
RBC: 3.93 MIL/uL — ABNORMAL LOW (ref 4.22–5.81)
RDW: 16.2 % — ABNORMAL HIGH (ref 11.5–15.5)
WBC: 5.1 10*3/uL (ref 4.0–10.5)
nRBC: 0 % (ref 0.0–0.2)

## 2022-11-02 MED ORDER — APIXABAN 2.5 MG PO TABS: 5.0000 mg | ORAL_TABLET | Freq: Two times a day (BID) | ORAL | 0 refills | Status: DC

## 2022-11-02 MED ORDER — ISOSORBIDE MONONITRATE ER 30 MG PO TB24: 15.0000 mg | ORAL_TABLET | Freq: Every day | ORAL | 0 refills | Status: DC

## 2022-11-02 NOTE — Progress Notes (Addendum)
Mobility Specialist - Progress Note   11/02/22 0953  Mobility  Activity Ambulated independently in hallway;Stood at bedside;Dangled on edge of bed  Level of Assistance Independent  Assistive Device None  Distance Ambulated (ft) 170 ft  Activity Response Tolerated well  Mobility Referral Yes  $Mobility charge 1 Mobility   Pt supine in bed on RA upon arrival. Pt STS and ambulates 1 lap around NS indep. PT desats to 78 SPO2 midway thru ambulation. 1L added and pt completes ambulation. Pt does not have complaints. PT left EOB with needs in reach.   Room Air   Pre-mobility: HR = 58 SpO2 = 91  During mobility: HR 81 SpO2 = 78 (80 feet into ambulation)   1L  During mobility: HR = 78 SpO2 = 83-85  Post-mobility: HR = 61 SPO2 = 98 (1 minute EOB)   [employer]  Mobility Specialist  11/02/22 9:58 AM

## 2022-11-02 NOTE — Progress Notes (Signed)
[facility]  ROUNDING NOTE   Subjective:   Patient seen sitting at side of the bed, completed breakfast tray at bedside Alert and oriented Patient remains on room air Denies nausea and vomiting No lower extremity edema  Objective:  Vital signs in last 24 hours:  Temp:  [97.4 F (36.3 C)-98 F (36.7 C)] 97.5 F (36.4 C) (12/09 0830) Pulse Rate:  [52-71] 71 (12/09 0830) Resp:  [18-20] 20 (12/09 0830) BP: (123-150)/(86-94) 150/94 (12/09 0830) SpO2:  [97 %-100 %] 98 % (12/09 0830) Weight:  [84.2 kg] 84.2 kg (12/09 0431)  Weight change: -1.3 kg Filed Weights   10/31/22 2010 11/01/22 0500 11/02/22 0431  Weight: 85.5 kg 83.5 kg 84.2 kg    Intake/Output: I/O last 3 completed shifts: In: 956.6 [P.O.:360; I.V.:596.6] Out: 2350 [Urine:2350]   Intake/Output this shift:  Total I/O In: -  Out: 825 [Urine:825]  Physical Exam: General: NAD  Head: Normocephalic, atraumatic. Moist oral mucosal membranes  Eyes: Anicteric  Lungs:  Clear to auscultation, normal effort, room air  Heart: Regular rate and rhythm  Abdomen:  Soft, nontender, mild distention  Extremities: No peripheral edema.  Neurologic: Nonfocal, moving all four extremities  Skin: No lesions    Basic Metabolic Panel: Recent Labs  Lab 10/30/22 1540 10/30/22 1844 10/31/22 0419 10/31/22 1429 11/01/22 0504 11/02/22 0442  NA 138  --  140 137 139 143  K 5.3*  --  5.6* 4.4 4.2 4.1  CL 107  --  108 104 108 111  CO2 22  --  '24 22 24 24  '$ GLUCOSE 130*  --  103* 120* 119* 99  BUN 80*  --  91* 89* 88* 71*  CREATININE 4.28* 4.37* 4.83* 4.55* 3.94* 2.85*  CALCIUM 9.1  --  8.9 8.6* 8.5* 9.0     Liver Function Tests: Recent Labs  Lab 10/27/22 0705 10/30/22 1540  AST 24 48*  ALT 17 28  ALKPHOS 131* 114  BILITOT 1.7* 2.1*  PROT 7.2 7.2  ALBUMIN 3.5 3.5    Recent Labs  Lab 10/27/22 0705  LIPASE 36    No results for input(s): "AMMONIA" in the last 168 hours.  CBC: Recent Labs  Lab  10/27/22 0705 10/30/22 1540 10/31/22 0419 11/01/22 0504 11/02/22 0442  WBC 6.2 5.0 6.1 6.0 5.1  NEUTROABS  --  3.0  --   --   --   HGB 12.4* 13.2 12.4* 12.4* 12.5*  HCT 38.6* 42.2 38.9* 38.2* 39.5  MCV 100.3* 102.9* 103.2* 100.0 100.5*  PLT 137* 127* 125* 120* 120*     Cardiac Enzymes: No results for input(s): "CKTOTAL", "CKMB", "CKMBINDEX", "TROPONINI" in the last 168 hours.  BNP: Invalid input(s): "POCBNP"  CBG: Recent Labs  Lab 11/01/22 2038  GLUCAP 137*    Microbiology: Results for orders placed or performed during the hospital encounter of 10/27/22  Resp Panel by RT-PCR (Flu A&B, Covid) Anterior Nasal Swab     Status: None   Collection Time: 10/27/22 10:58 AM   Specimen: Anterior Nasal Swab  Result Value Ref Range Status   SARS Coronavirus 2 by RT PCR NEGATIVE NEGATIVE Final    Comment: (NOTE) SARS-CoV-2 target nucleic acids are NOT DETECTED.  The SARS-CoV-2 RNA is generally detectable in upper respiratory specimens during the acute phase of infection. The lowest concentration of SARS-CoV-2 viral copies this assay can detect is 138 copies/mL. A negative result does not preclude SARS-Cov-2 infection and should not be used as the sole basis for treatment or  other patient management decisions. A negative result may occur with  improper specimen collection/handling, submission of specimen other than nasopharyngeal swab, presence of viral mutation(s) within the areas targeted by this assay, and inadequate number of viral copies(<138 copies/mL). A negative result must be combined with clinical observations, patient history, and epidemiological information. The expected result is Negative.  Fact Sheet for Patients:  EntrepreneurPulse.com.au  Fact Sheet for Healthcare Providers:  IncredibleEmployment.be  This test is no t yet approved or cleared by the [place] FDA and  has been authorized for detection and/or diagnosis of  SARS-CoV-2 by FDA under an Emergency Use Authorization (EUA). This EUA will remain  in effect (meaning this test can be used) for the duration of the COVID-19 declaration under Section 564(b)(1) of the Act, 21 U.S.C.section 360bbb-3(b)(1), unless the authorization is terminated  or revoked sooner.       Influenza A by PCR NEGATIVE NEGATIVE Final   Influenza B by PCR NEGATIVE NEGATIVE Final    Comment: (NOTE) The Xpert Xpress SARS-CoV-2/FLU/RSV plus assay is intended as an aid in the diagnosis of influenza from Nasopharyngeal swab specimens and should not be used as a sole basis for treatment. Nasal washings and aspirates are unacceptable for Xpert Xpress SARS-CoV-2/FLU/RSV testing.  Fact Sheet for Patients: EntrepreneurPulse.com.au  Fact Sheet for Healthcare Providers: IncredibleEmployment.be  This test is not yet approved or cleared by the [place] FDA and has been authorized for detection and/or diagnosis of SARS-CoV-2 by FDA under an Emergency Use Authorization (EUA). This EUA will remain in effect (meaning this test can be used) for the duration of the COVID-19 declaration under Section 564(b)(1) of the Act, 21 U.S.C. section 360bbb-3(b)(1), unless the authorization is terminated or revoked.  Performed at [facility], [address] [place], [place] [address]     Coagulation Studies: No results for input(s): "LABPROT", "INR" in the last 72 hours.  Urinalysis: No results for input(s): "COLORURINE", "LABSPEC", "PHURINE", "GLUCOSEU", "HGBUR", "BILIRUBINUR", "KETONESUR", "PROTEINUR", "UROBILINOGEN", "NITRITE", "LEUKOCYTESUR" in the last 72 hours.  Invalid input(s): "APPERANCEUR"    Imaging: No results found.   Medications:     allopurinol  150 mg Oral Daily   apixaban  2.5 mg Oral BID   aspirin EC  81 mg Oral Daily   atorvastatin  40 mg Oral Daily   carvedilol  6.25 mg Oral BID WC   isosorbide mononitrate   15 mg Oral Daily   pantoprazole  40 mg Oral Daily   acetaminophen **OR** acetaminophen, bisacodyl, ondansetron **OR** ondansetron (ZOFRAN) IV, polyethylene glycol, triamcinolone ointment  Assessment/ Plan:  Taylor [name] is a 81 y.o.  male with medical problems of medical problems of heart failure with reduced EF, Barrett's esophagus, coronary artery disease status post stent, AICD, chronic kidney disease, degenerative disc disease, gout, bladder cancer history, hyperlipidemia, psoriasis, AAA, history of stroke, hypertension, restrictive lung disease, history of pancreatitis, atrial fibrillation, diabetes type 2, carotid stenosis, obstructive sleep apnea was admitted on 10/30/2022 for Hypoxia [R09.02] Congestive heart failure (CHF) ([place]) [I50.9] AKI (acute kidney injury) ([place]) [N17.9] Elevated d-dimer [R79.89] Dyspnea, unspecified type [R06.00]   1.  Acute kidney injury appears secondary to cardiorenal syndrome 2.  Acute on chronic systolic and diastolic dysfunction.  Per chart, heart failure with reduced EF 20%, severely dilated left ventricular internal cavity, grade 2 diastolic dysfunction, severe left atrial enlargement, moderate right atrial enlargement, severe mitral regurgitation, moderate to severe tricuspid regurgitation, mild aortic insufficiency. 3.  Chronic kidney disease stage IIIb.  Baseline creatinine 2.1/GFR 32 from 07/23/2022. 4.  Diabetes type 2 with nephropathy.   Lab Results  Component Value Date   CREATININE 2.85 (H) 11/02/2022   CREATININE 3.94 (H) 11/01/2022   CREATININE 4.55 (H) 10/31/2022    Intake/Output Summary (Last 24 hours) at 11/02/2022 1345 Last data filed at 11/02/2022 0831 Gross per 24 hour  Intake 120 ml  Output 1525 ml  Net -1405 ml    Urinalysis negative for blood or WBCs from December 3. Mild proteinuria noted. Imaging negative for obstruction on December 3.   Plan: Creatinine improved with adequate urine output noted. Avoid nephrotoxic agents  and therapies including hypotension.  Cardiology following and assisting with management failure treatments.  Patient wishes to follow-up with outpatient nephrology as needed.  Currently no follow-up is being scheduled.   LOS: 3 Etoy Mcdonnell 12/9/20231:45 PM

## 2022-11-02 NOTE — TOC Transition Note (Signed)
Transition of Care [facility]) - CM/SW Discharge Note   Patient Details  Name: Taylor [name] MRN: [id number] Date of Birth: Dec 08, 1940  [facility] CM/SW Contact:  Harriet Masson, RN Phone Number: [phone] 11/02/2022, 11:11 AM   Clinical Narrative:    [facility] RN spoke with pt today and discussed home O2 for discharge today. Pt aware of the process having a history of home O2 before. Pt will receive a rechargeable portable and set up the main concentrator on Mon/Tues. Pt receptive to [employer] (history) and portable will be delivered to the bedside for discharge today within the next 2 hours.  TOC RN remains available for any additional needs.   Final next level of care: Home/Self Care Barriers to Discharge: Barriers Resolved   Patient Goals and CMS Choice Patient states their goals for this hospitalization and ongoing recovery are:: Be well and get back home      Discharge Placement                    Patient and family notified of of transfer: 11/02/22  Discharge Plan and Services   Discharge Planning Services: CM Consult            DME Arranged: N/A DME Agency: [employer] Date DME Agency Contacted: 11/02/22 Time DME Agency Contacted: [phone] Representative spoke with at DME Agency: [place]: NA [place] Agency: NA        Social Determinants of Health (SDOH) Interventions     Readmission Risk Interventions     No data to display

## 2022-11-02 NOTE — Progress Notes (Signed)
[facility] Encounter Note  Patient: Taylor [name] / Admit Date: 10/30/2022 / Date of Encounter: 11/02/2022, 8:31 AM   Subjective: Patient overall has had a good night with excellent use of CPAP machine for which the patient may have had movements with his congestive heart failure.  The patient has had acute on chronic systolic dysfunction congestive heart failure and worsening acute kidney injury now slightly improved with some hydration to glomerular filtration rate of 22.  Patient has not had any chest pain or elevation of troponin or concerns for acute coronary syndrome.  He has had greater than 1700 mL output.  Heart rate controlled despite atrial fibrillation with pacemaker and defibrillator treatment.  Diagnostics include echocardiogram showing severe LV systolic dysfunction and mitral insufficiency most consistent with with his LV dysfunction rather than primary structural mitral valve disease.  Review of Systems: Positive for: Shortness of breath Negative for: Vision change, hearing change, syncope, dizziness, nausea, vomiting,diarrhea, bloody stool, stomach pain, cough, congestion, diaphoresis, urinary frequency, urinary pain,skin lesions, skin rashes Others previously listed  Objective: Telemetry: Atrial fibrillation with paced rhythm Physical Exam: Blood pressure (!) 150/94, pulse 71, temperature (!) 97.5 F (36.4 C), resp. rate 20, height '5\' 10"'$  (1.778 m), weight 84.2 kg, SpO2 98 %. Body mass index is 26.63 kg/m. General: Well developed, well nourished, in no acute distress. Head: Normocephalic, atraumatic, sclera non-icteric, no xanthomas, nares are without discharge. Neck: No apparent masses Lungs: Normal respirations with no wheezes, no rhonchi, no rales , no crackles   Heart: Regular rate and rhythm, normal S1 S2, no murmur, no rub, no gallop, PMI is normal size and placement, carotid upstroke normal without bruit, jugular venous pressure normal Abdomen:  Soft, non-tender, non-distended with normoactive bowel sounds. No hepatosplenomegaly. Abdominal aorta is normal size without bruit Extremities: No edema, no clubbing, no cyanosis, no ulcers,  Peripheral: 2+ radial, 2+ femoral, 2+ dorsal pedal pulses Neuro: Alert and oriented. Moves all extremities spontaneously. Psych:  Responds to questions appropriately with a normal affect.   Intake/Output Summary (Last 24 hours) at 11/02/2022 0831 Last data filed at 11/01/2022 2228 Gross per 24 hour  Intake 360 ml  Output 1450 ml  Net -1090 ml    Inpatient Medications:   allopurinol  150 mg Oral Daily   apixaban  2.5 mg Oral BID   aspirin EC  81 mg Oral Daily   atorvastatin  40 mg Oral Daily   carvedilol  6.25 mg Oral BID WC   isosorbide mononitrate  15 mg Oral Daily   pantoprazole  40 mg Oral Daily   Infusions:   Labs: Recent Labs    11/01/22 0504 11/02/22 0442  NA 139 143  K 4.2 4.1  CL 108 111  CO2 24 24  GLUCOSE 119* 99  BUN 88* 71*  CREATININE 3.94* 2.85*  CALCIUM 8.5* 9.0   Recent Labs    10/30/22 1540  AST 48*  ALT 28  ALKPHOS 114  BILITOT 2.1*  PROT 7.2  ALBUMIN 3.5   Recent Labs    10/30/22 1540 10/31/22 0419 11/01/22 0504 11/02/22 0442  WBC 5.0   < > 6.0 5.1  NEUTROABS 3.0  --   --   --   HGB 13.2   < > 12.4* 12.5*  HCT 42.2   < > 38.2* 39.5  MCV 102.9*   < > 100.0 100.5*  PLT 127*   < > 120* 120*   < > = values in this interval not displayed.  No results for input(s): "CKTOTAL", "CKMB", "TROPONINI" in the last 72 hours. Invalid input(s): "POCBNP" No results for input(s): "HGBA1C" in the last 72 hours.   Weights: Filed Weights   10/31/22 2010 11/01/22 0500 11/02/22 0431  Weight: 85.5 kg 83.5 kg 84.2 kg     Radiology/Studies:  NM Pulmonary Perfusion  Result Date: 10/31/2022 CLINICAL DATA:  Pulmonary embolism suspected. Low to intermediate probability. Positive D-dimer. EXAM: NUCLEAR MEDICINE PERFUSION LUNG SCAN TECHNIQUE: Perfusion images were  obtained in multiple projections after intravenous injection of radiopharmaceutical. Ventilation scans intentionally deferred if perfusion scan and chest x-ray adequate for interpretation during COVID 19 epidemic. RADIOPHARMACEUTICALS:  3.61 mCi Tc-28mMAA IV COMPARISON:  No prior ventilation perfusion studies are available for comparison. Correlation is made with AP chest 10/30/2022 and chest two views 10/15/2022 FINDINGS: Perfusion only modified PIOPED II protocol. Cardiomegaly is again noted. There is mild heterogeneity of the perfusion radiotracer bilaterally. No large segmental perfusion defect is seen. IMPRESSION: Very low probability for pulmonary embolism. Electronically Signed   By: RYvonne KendallM.D.   On: 10/31/2022 13:38   ECHOCARDIOGRAM COMPLETE  Result Date: 10/31/2022    ECHOCARDIOGRAM REPORT   Patient Name:   Taylor SCHUELERDate of Exam: 10/30/2022 Medical Rec #:  [id number]   Height:       70.0 in Accession #:    [id number]  Weight:       189.0 lb Date of Birth:  1Apr 06, 1942  BSA:          2.038 m Patient Age:    89years     BP:           119/85 mmHg Patient Gender: M            HR:           75 bpm. Exam Location:  [facility] Procedure: 2D Echo, Cardiac Doppler, Color Doppler and Intracardiac            Opacification Agent Indications:     I50.9 Congestive heart failure  History:         Patient has no prior history of Echocardiogram examinations.                  Previous Myocardial Infarction and CAD, Defibrillator, Stroke,                  Signs/Symptoms:Dyspnea and Edema; Risk Factors:Hypertension and                  Dyslipidemia.  Sonographer:     NCresenciano LickRDCS Referring Phys:  [id number] OsmondDiagnosing Phys: BSerafina RoyalsMD IMPRESSIONS  1. Left ventricular ejection fraction, by estimation, is <20%. The left ventricle has severely decreased function. The left ventricle demonstrates global hypokinesis. The left ventricular internal cavity size was severely dilated.  There is mild left ventricular hypertrophy. Left ventricular diastolic parameters are consistent with Grade II diastolic dysfunction (pseudonormalization).  2. Right ventricular systolic function is normal. The right ventricular size is normal.  3. Left atrial size was severely dilated.  4. Right atrial size was moderately dilated.  5. The mitral valve is degenerative. Severe mitral valve regurgitation.  6. Tricuspid valve regurgitation is moderate to severe.  7. The aortic valve is normal in structure. Aortic valve regurgitation is mild. FINDINGS  Left Ventricle: Left ventricular ejection fraction, by estimation, is <20%. The left ventricle has severely decreased function. The left ventricle demonstrates global hypokinesis. Definity contrast agent was  given IV to delineate the left ventricular endocardial borders. The left ventricular internal cavity size was severely dilated. There is mild left ventricular hypertrophy. Left ventricular diastolic parameters are consistent with Grade II diastolic dysfunction (pseudonormalization). Right Ventricle: The right ventricular size is normal. No increase in right ventricular wall thickness. Right ventricular systolic function is normal. Left Atrium: Left atrial size was severely dilated. Right Atrium: Right atrial size was moderately dilated. Pericardium: There is no evidence of pericardial effusion. Mitral Valve: The mitral valve is degenerative in appearance. Severe mitral valve regurgitation. Tricuspid Valve: The tricuspid valve is normal in structure. Tricuspid valve regurgitation is moderate to severe. Aortic Valve: The aortic valve is normal in structure. Aortic valve regurgitation is mild. Aortic valve mean gradient measures 4.8 mmHg. Aortic valve peak gradient measures 8.6 mmHg. Aortic valve area, by VTI measures 1.45 cm. Pulmonic Valve: The pulmonic valve was normal in structure. Pulmonic valve regurgitation is mild. Aorta: The aortic root and ascending aorta are  structurally normal, with no evidence of dilitation. IAS/Shunts: No atrial level shunt detected by color flow Doppler.  LEFT VENTRICLE PLAX 2D LVIDd:         6.30 cm LVIDs:         5.00 cm LV PW:         0.80 cm LV IVS:        0.80 cm LVOT diam:     2.30 cm LV SV:         40 LV SV Index:   20 LVOT Area:     4.15 cm  RIGHT VENTRICLE            IVC RV Basal diam:  5.20 cm    IVC diam: 2.20 cm RV S prime:     5.78 cm/s TAPSE (M-mode): 1.5 cm LEFT ATRIUM             Index        RIGHT ATRIUM           Index LA diam:        4.70 cm 2.31 cm/m   RA Area:     24.50 cm LA Vol (A2C):   85.9 ml 42.15 ml/m  RA Volume:   87.90 ml  43.14 ml/m LA Vol (A4C):   88.6 ml 43.48 ml/m LA Biplane Vol: 89.3 ml 43.82 ml/m  AORTIC VALVE AV Area (Vmax):    1.42 cm AV Area (Vmean):   1.25 cm AV Area (VTI):     1.45 cm AV Vmax:           146.40 cm/s AV Vmean:          99.860 cm/s AV VTI:            0.278 m AV Peak Grad:      8.6 mmHg AV Mean Grad:      4.8 mmHg LVOT Vmax:         49.90 cm/s LVOT Vmean:        30.150 cm/s LVOT VTI:          0.097 m LVOT/AV VTI ratio: 0.35  AORTA Ao Root diam: 3.70 cm Ao Asc diam:  4.20 cm MITRAL VALVE               TRICUSPID VALVE MV Area (PHT): 5.81 cm    TR Peak grad:   23.2 mmHg MV Decel Time: 131 msec    TR Vmax:        241.00 cm/s MV E velocity:  84.10 cm/s MV A velocity: 35.10 cm/s  SHUNTS MV E/A ratio:  2.40        Systemic VTI:  0.10 m                            Systemic Diam: 2.30 cm Serafina Royals MD Electronically signed by Serafina Royals MD Signature Date/Time: 10/31/2022/9:20:54 AM    Final    DG Chest Portable 1 View  Result Date: 10/30/2022 CLINICAL DATA:  Shortness of breath, hypoxia EXAM: PORTABLE CHEST 1 VIEW COMPARISON:  Previous studies including the examination of 10/27/2022 FINDINGS: Transverse diameter of heart is increased. Central pulmonary vessels are prominent. There are no signs of alveolar pulmonary edema. There is no focal pulmonary consolidation. There is no pleural  effusion or pneumothorax. There is previous coronary bypass surgery. Pacemaker/defibrillator battery is seen in left infraclavicular region with tips of leads in right atrium and right ventricle. IMPRESSION: Cardiomegaly. Central pulmonary vessels are prominent suggesting mild CHF. No focal pulmonary consolidation is seen. There is no significant pleural effusion. Electronically Signed   By: Elmer Picker M.D.   On: 10/30/2022 16:10   US ABDOMEN LIMITED RUQ (LIVER/GB)  Result Date: 10/27/2022 CLINICAL DATA:  Right upper quadrant pain EXAM: ULTRASOUND ABDOMEN LIMITED RIGHT UPPER QUADRANT COMPARISON:  Same day CT FINDINGS: Gallbladder: No gallstones or wall thickening visualized. No sonographic Murphy sign noted by sonographer. Common bile duct: Diameter: 4 mm. Liver: No focal lesion identified. Within normal limits in parenchymal echogenicity. Portal vein is patent on color Doppler imaging with normal direction of blood flow towards the liver. Other: None. IMPRESSION: Unremarkable right upper quadrant ultrasound. Electronically Signed   By: Davina Poke D.O.   On: 10/27/2022 10:40   CT ABDOMEN PELVIS WO CONTRAST  Result Date: 10/27/2022 CLINICAL DATA:  Nausea and dizziness with abdomen pain. EXAM: CT ABDOMEN AND PELVIS WITHOUT CONTRAST TECHNIQUE: Multidetector CT imaging of the abdomen and pelvis was performed following the standard protocol without IV contrast. RADIATION DOSE REDUCTION: This exam was performed according to the departmental dose-optimization program which includes automated exposure control, adjustment of the mA and/or kV according to patient size and/or use of iterative reconstruction technique. COMPARISON:  November 27, 2019 FINDINGS: Lower chest: Minimal scarring atelectasis of bilateral lung bases are noted. The heart size is enlarged. Hepatobiliary: No focal liver abnormality is seen. No gallstones, gallbladder wall thickening, or biliary dilatation. Pancreas: Unremarkable. No  pancreatic ductal dilatation or surrounding inflammatory changes. Small focal calcification identified in the pancreatic head unchanged compared prior exam. Spleen: Calcified granulomas identified in the spleen. The spleen is normal in size. Adrenals/Urinary Tract: The left kidney is atrophic. Stable 1.2 cm right kidney cyst is identified, best seen on coronal view image 51 series 5. No follow-up is recommended. There is no hydronephrosis bilaterally. The bilateral adrenal glands are normal. The bladder is partially decompressed without gross abnormality. Stomach/Bowel: Stomach is within normal limits. The appendix is not seen but no inflammation is noted around cecum. No evidence of bowel wall thickening, distention, or inflammatory changes. Diverticulosis of colon. Vascular/Lymphatic: Atherosclerosis of the aorta. Status post stent graft repair of the infrarenal aortic aneurysm. Aneurysm measures 3.4 cm on current exam unchanged. No abdominal or pelvic lymphadenopathy is noted. Reproductive: Prostate is unremarkable. Other: Right inguinal herniation of mesenteric fat is noted. Musculoskeletal: Degenerative joint changes of the spine noted. IMPRESSION: 1. No acute abnormality identified in the abdomen and pelvis. 2. Status post stent graft repair  of the infrarenal aortic aneurysm. Aneurysm measures 3.4 cm on current exam unchanged. 3. Aortic atherosclerosis. Aortic Atherosclerosis (ICD10-I70.0). Electronically Signed   By: Abelardo Diesel M.D.   On: 10/27/2022 08:38   DG Chest 2 View  Result Date: 10/27/2022 CLINICAL DATA:  Shortness of breath EXAM: CHEST - 2 VIEW COMPARISON:  October 15, 2022 FINDINGS: The heart size and mediastinal contours are stable. Heart size is enlarged. Cardiac pacemaker is unchanged. Mild increased pulmonary interstitium is identified bilaterally. No focal pneumonia or pleural effusion is noted. The visualized skeletal structures are stable. IMPRESSION: Mild congestive heart failure.  Electronically Signed   By: Abelardo Diesel M.D.   On: 10/27/2022 08:26   CT HEAD WO CONTRAST (5MM)  Result Date: 10/27/2022 CLINICAL DATA:  Mental status change with unknown cause. EXAM: CT HEAD WITHOUT CONTRAST TECHNIQUE: Contiguous axial images were obtained from the base of the skull through the vertex without intravenous contrast. RADIATION DOSE REDUCTION: This exam was performed according to the departmental dose-optimization program which includes automated exposure control, adjustment of the mA and/or kV according to patient size and/or use of iterative reconstruction technique. COMPARISON:  12/07/2017.  CTA of the neck 07/03/2022. FINDINGS: Brain: No evidence of acute infarction, hemorrhage, hydrocephalus, extra-axial collection or mass lesion/mass effect. 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 Small remote right cerebellar infarct. Generalized atrophy. Vascular: No hyperdense vessel or unexpected calcification. Skull: Remote left calvarial fracture with healed mild depression. Sinuses/Orbits: Extensive paranasal sinus disease with medial bulging of the medial maxillary sinus walls, progressed from 2019. Essentially all of the paranasal sinuses are involved. Bilateral cataract resection. No evidence of orbital inflammation. IMPRESSION: 1. No acute finding. 2. Atrophy and remote left occipital infarct. 3. Advanced generalized sinusitis. Electronically Signed   By: Jorje Guild M.D.   On: 10/27/2022 08:21   DG Chest 2 View  Result Date: 10/15/2022 CLINICAL DATA:  Shortness of breath and orthopnea. EXAM: CHEST - 2 VIEW COMPARISON:  08/09/2020 FINDINGS: There is a left chest wall ICD with leads in the right atrial appendage and right ventricle. Previous median sternotomy and CABG procedure. Stable cardiac enlargement. No pleural effusion or interstitial edema. Mild coarsened interstitial markings appear similar. Visualized osseous structures are notable for remote healed right  clavicle fracture. IMPRESSION: 1. No acute cardiopulmonary abnormalities. 2. Stable cardiac enlargement. Electronically Signed   By: Kerby Moors M.D.   On: 10/15/2022 10:58     Assessment and Recommendation  81 y.o. male with acute on chronic systolic dysfunction congestive heart failure multifactorial in nature including acute kidney injury now with good urine output and improvements of kidney dysfunction after discontinuation of diuretics and no evidence of acute coronary syndrome 1.  Continuation of medication management and treatment for cardiomyopathy including beta-blocker.  Would hold on ACE inhibitor or angiotensin receptor blocker dapagliflozin and/or Entresto due to chronic kidney disease stage IV 2.  No further cardiac diagnostics necessary at this time 3.  Continue anticoagulation for further risks reduction and stroke with atrial fibrillation 4.  Begin ambulation and follow improvement.  Would be okay for discharge home from cardiovascular standpoint today with follow-up in 1 to 2 weeks.  Discharge diuretics with potential consultation with nephrology as to dosage  Signed, Serafina Royals M.D. FACC

## 2022-11-02 NOTE — Discharge Summary (Signed)
Physician Discharge Summary  Patient: Taylor [name] UTM:[id number] DOB: 06-23-1941   Code Status: Full Code Admit date: 10/30/2022 Discharge date: 11/02/2022 Disposition: Home, No home health services recommended PCP: Birdie Sons, MD  Recommendations for Outpatient Follow-up:  Follow up with PCP within 1-2 weeks Regarding general hospital follow up and preventative care Recommend CBC, CMP to monitor anemia and kidney function Follow up with nephrology Regarding CKD4 Follow up with cardiology Regarding severely worsened HF and MVP. BP would not allow for optimal medical therapy during admission. Medication changes as listed below  Discharge Diagnoses:  Principal Problem:   Congestive heart failure (CHF) ([place]) Active Problems:   CAD S/P percutaneous coronary angioplasty   Elevated d-dimer   Abdominal aortic aneurysm (AAA) (H[facility])   AKI (acute kidney injury) ([place])   Dyspnea   Mitral valve insufficiency   HFrEF (heart failure with reduced ejection fraction) (H[facility])   Acute on chronic respiratory failure with hypoxia [facility])  Brief Hospital Course Summary: Taylor [name] is a 81 y.o. male with a PMH significant for HFrEF, Barrett esophagus, CAD s/p stent placement, s/p AICD, CKDIII, DDD, gout, bladder cancer, HLD, pacemaker placed, psoriasis, AAA, h/o CVA, HTN, ischemic cardiomyopathy, h/o ventricular fibrillation, restrictive lung disease, pleural nodule, h/o pancreatitis, Afib (on eliquis), Type II DM, carotid stenosis. At baseline, they live at home with their wife and are independent with ADLs.   They presented from home to the ED on 10/30/2022 with DOE and orthopnea x 7 days. It acutely worsened today and he is unable to lay flat.  - He had been seen in ED 12/3 for same and prescribed doxycycline for sinusitis.  - He had been evaluated by his primary care doctor on 12/4 and diagnosed with a URVI and they increased his torsemide to TID.    Upon arrival to ED, he states that he has  difficulty ambulating across the room due to SOB which is much worse than his baseline. He has only mild symptoms while resting but is unable to lay flat as this causes his breathing to acutely worsen. States he has been taking his torsemide '20mg'$  TID as directed since seeing his PCP. Denies any LE swelling.  Denies cough, chest pain, diaphoresis, syncope. Denies any changes in his bowel or urine habits. Did not notice increased urine output with his increase frequency of torsemide.   In the ED, it was found that they had stable vital signs ORA. Labs showed negative covid or flu. BNP elevated to 554.5 which is above his baseline. Troponin 30 which is about his baseline. K+ 5.3, LA 2.8, CBC at baseline,  ECG shows possible RBBB not seen previously. Paced rhythm. No ST changes. Significant findings included: Cr was 4.28 from a baseline around 2.5, D-dimer 2.52 Chest xray showed cardiomegaly and central vascular congestion.  VQ scan pending.    They were initially treated with IV lasix '60mg'$ .    12/7- Cr worsened with diuretics so discontinued and put on gentle fluids in setting of HF. Nephrology consulted for AKI on CKDIIIb.  Echo resulted today showing severe valvular disease not shown on prior study 2021. Cardiology consulted to evaluate for possible replacement. They did not recommend any procedures at this time. Recommended diuresis and outpatient follow up. 12/8- Cr improved to 3.94 with IV fluids. They were stopped. Breathing improved to only requiring 2L to maintain saturations. He was euvolemic on exam 2/9- Cr improved to baseline of 2.85. able to be ORA while at rest but  needed 2L [place] while ambulating to maintain oxygen saturations >90%. Denied CP, SOB.   Discharged with 1-2L oxygen for desaturations to 80s while ambulating.   Discharge Condition: Fair, improved Recommended discharge diet: Cardiac diet  Consultations: Cardiology Nephrology   Procedures/Studies: none  Allergies as of  11/02/2022       Reactions   Amlodipine Besylate Swelling   Had a reaction when taking with colcrys    Crestor [rosuvastatin]    Muscle cramps and pain   Rocephin [ceftriaxone]    unknown        Medication List     STOP taking these medications    B-12 2500 MCG Tabs   doxycycline 100 MG tablet Commonly known as: VIBRA-TABS   GLUCOSAMINE HCL PO   losartan 50 MG tablet Commonly known as: COZAAR   potassium chloride SA 20 MEQ tablet Commonly known as: KLOR-CON M       TAKE these medications    allopurinol 300 MG tablet Commonly known as: ZYLOPRIM Take 0.5 tablets (150 mg total) by mouth daily.   apixaban 2.5 MG Tabs tablet Commonly known as: ELIQUIS Take 2 tablets (5 mg total) by mouth 2 (two) times daily. What changed: medication strength   aspirin EC 81 MG tablet Take 81 mg by mouth daily.   atorvastatin 40 MG tablet Commonly known as: LIPITOR TAKE 1 TABLET(40 MG) BY MOUTH DAILY   Calcium Carbonate Antacid 600 MG chewable tablet Chew 1 tablet by mouth daily.   carvedilol 12.5 MG tablet Commonly known as: COREG Take 12.5 mg by mouth 2 (two) times daily with a meal.   isosorbide mononitrate 30 MG 24 hr tablet Commonly known as: IMDUR Take 0.5 tablets (15 mg total) by mouth daily. Start taking on: November 03, 2022   Magnesium Oxide 250 MG Tabs Take 250 mg by mouth daily.   metFORMIN 500 MG 24 hr tablet Commonly known as: GLUCOPHAGE-XR TAKE 2 TABLETS(1000 MG) BY MOUTH DAILY What changed: See the new instructions.   OneTouch Verio test strip Generic drug: glucose blood Use as instructed to check sugar daily   pantoprazole 40 MG tablet Commonly known as: PROTONIX TAKE 1 TABLET BY MOUTH EVERY DAY   torsemide 20 MG tablet Commonly known as: DEMADEX Take 40 mg by mouth daily.   triamcinolone ointment 0.1 % Commonly known as: KENALOG APPLY TOPICALLY TWICE DAILY AS DIRECTED               Durable Medical Equipment  (From admission,  onward)           Start     Ordered   11/02/22 0745  For home use only DME oxygen  Once       Question Answer Comment  Length of Need 6 Months   Mode or (Route) Nasal cannula   Liters per Minute 1   Frequency Continuous (stationary and portable oxygen unit needed)   Oxygen conserving device Yes   Oxygen delivery system Gas      11/02/22 0745            Subjective   Pt reports no complaints today. Denies chest pain, SOB palpitations at rest. Denies LE swelling.  Endorses understanding and agreement with plan for very close cardiology follow up.  All questions and concerns were addressed at time of discharge.  Objective  Blood pressure (!) 150/94, pulse 71, temperature (!) 97.5 F (36.4 C), resp. rate 20, height '5\' 10"'$  (1.778 m), weight 84.2 kg, SpO2 98 %.  General: Pt is alert, awake, not in acute distress Cardiovascular: RRR, S1/S2 +, no rubs, no gallops Respiratory: CTA bilaterally, no wheezing, no rhonchi Abdominal: Soft, NT, ND, bowel sounds + Extremities: no edema, no cyanosis  The results of significant diagnostics from this hospitalization (including imaging, microbiology, ancillary and laboratory) are listed below for reference.   Imaging studies: NM Pulmonary Perfusion  Result Date: 10/31/2022 CLINICAL DATA:  Pulmonary embolism suspected. Low to intermediate probability. Positive D-dimer. EXAM: NUCLEAR MEDICINE PERFUSION LUNG SCAN TECHNIQUE: Perfusion images were obtained in multiple projections after intravenous injection of radiopharmaceutical. Ventilation scans intentionally deferred if perfusion scan and chest x-ray adequate for interpretation during COVID 19 epidemic. RADIOPHARMACEUTICALS:  3.61 mCi Tc-61mMAA IV COMPARISON:  No prior ventilation perfusion studies are available for comparison. Correlation is made with AP chest 10/30/2022 and chest two views 10/15/2022 FINDINGS: Perfusion only modified PIOPED II protocol. Cardiomegaly is again noted. There is  mild heterogeneity of the perfusion radiotracer bilaterally. No large segmental perfusion defect is seen. IMPRESSION: Very low probability for pulmonary embolism. Electronically Signed   By: RYvonne KendallM.D.   On: 10/31/2022 13:38   ECHOCARDIOGRAM COMPLETE  Result Date: 10/31/2022    ECHOCARDIOGRAM REPORT   Patient Name:   Taylor JEUNEDate of Exam: 10/30/2022 Medical Rec #:  [id number]   Height:       70.0 in Accession #:    [id number]  Weight:       189.0 lb Date of Birth:  1Aug 16, 1942  BSA:          2.038 m Patient Age:    89years     BP:           119/85 mmHg Patient Gender: M            HR:           75 bpm. Exam Location:  [facility] Procedure: 2D Echo, Cardiac Doppler, Color Doppler and Intracardiac            Opacification Agent Indications:     I50.9 Congestive heart failure  History:         Patient has no prior history of Echocardiogram examinations.                  Previous Myocardial Infarction and CAD, Defibrillator, Stroke,                  Signs/Symptoms:Dyspnea and Edema; Risk Factors:Hypertension and                  Dyslipidemia.  Sonographer:     NCresenciano LickRDCS Referring Phys:  [id number] OsmondDiagnosing Phys: BSerafina RoyalsMD IMPRESSIONS  1. Left ventricular ejection fraction, by estimation, is <20%. The left ventricle has severely decreased function. The left ventricle demonstrates global hypokinesis. The left ventricular internal cavity size was severely dilated. There is mild left ventricular hypertrophy. Left ventricular diastolic parameters are consistent with Grade II diastolic dysfunction (pseudonormalization).  2. Right ventricular systolic function is normal. The right ventricular size is normal.  3. Left atrial size was severely dilated.  4. Right atrial size was moderately dilated.  5. The mitral valve is degenerative. Severe mitral valve regurgitation.  6. Tricuspid valve regurgitation is moderate to severe.  7. The aortic valve is normal in structure.  Aortic valve regurgitation is mild. FINDINGS  Left Ventricle: Left ventricular ejection fraction, by estimation, is <20%. The left ventricle has severely decreased function. The left  ventricle demonstrates global hypokinesis. Definity contrast agent was given IV to delineate the left ventricular endocardial borders. The left ventricular internal cavity size was severely dilated. There is mild left ventricular hypertrophy. Left ventricular diastolic parameters are consistent with Grade II diastolic dysfunction (pseudonormalization). Right Ventricle: The right ventricular size is normal. No increase in right ventricular wall thickness. Right ventricular systolic function is normal. Left Atrium: Left atrial size was severely dilated. Right Atrium: Right atrial size was moderately dilated. Pericardium: There is no evidence of pericardial effusion. Mitral Valve: The mitral valve is degenerative in appearance. Severe mitral valve regurgitation. Tricuspid Valve: The tricuspid valve is normal in structure. Tricuspid valve regurgitation is moderate to severe. Aortic Valve: The aortic valve is normal in structure. Aortic valve regurgitation is mild. Aortic valve mean gradient measures 4.8 mmHg. Aortic valve peak gradient measures 8.6 mmHg. Aortic valve area, by VTI measures 1.45 cm. Pulmonic Valve: The pulmonic valve was normal in structure. Pulmonic valve regurgitation is mild. Aorta: The aortic root and ascending aorta are structurally normal, with no evidence of dilitation. IAS/Shunts: No atrial level shunt detected by color flow Doppler.  LEFT VENTRICLE PLAX 2D LVIDd:         6.30 cm LVIDs:         5.00 cm LV PW:         0.80 cm LV IVS:        0.80 cm LVOT diam:     2.30 cm LV SV:         40 LV SV Index:   20 LVOT Area:     4.15 cm  RIGHT VENTRICLE            IVC RV Basal diam:  5.20 cm    IVC diam: 2.20 cm RV S prime:     5.78 cm/s TAPSE (M-mode): 1.5 cm LEFT ATRIUM             Index        RIGHT ATRIUM           Index  LA diam:        4.70 cm 2.31 cm/m   RA Area:     24.50 cm LA Vol (A2C):   85.9 ml 42.15 ml/m  RA Volume:   87.90 ml  43.14 ml/m LA Vol (A4C):   88.6 ml 43.48 ml/m LA Biplane Vol: 89.3 ml 43.82 ml/m  AORTIC VALVE AV Area (Vmax):    1.42 cm AV Area (Vmean):   1.25 cm AV Area (VTI):     1.45 cm AV Vmax:           146.40 cm/s AV Vmean:          99.860 cm/s AV VTI:            0.278 m AV Peak Grad:      8.6 mmHg AV Mean Grad:      4.8 mmHg LVOT Vmax:         49.90 cm/s LVOT Vmean:        30.150 cm/s LVOT VTI:          0.097 m LVOT/AV VTI ratio: 0.35  AORTA Ao Root diam: 3.70 cm Ao Asc diam:  4.20 cm MITRAL VALVE               TRICUSPID VALVE MV Area (PHT): 5.81 cm    TR Peak grad:   23.2 mmHg MV Decel Time: 131 msec    TR Vmax:  241.00 cm/s MV E velocity: 84.10 cm/s MV A velocity: 35.10 cm/s  SHUNTS MV E/A ratio:  2.40        Systemic VTI:  0.10 m                            Systemic Diam: 2.30 cm Serafina Royals MD Electronically signed by Serafina Royals MD Signature Date/Time: 10/31/2022/9:20:54 AM    Final    DG Chest Portable 1 View  Result Date: 10/30/2022 CLINICAL DATA:  Shortness of breath, hypoxia EXAM: PORTABLE CHEST 1 VIEW COMPARISON:  Previous studies including the examination of 10/27/2022 FINDINGS: Transverse diameter of heart is increased. Central pulmonary vessels are prominent. There are no signs of alveolar pulmonary edema. There is no focal pulmonary consolidation. There is no pleural effusion or pneumothorax. There is previous coronary bypass surgery. Pacemaker/defibrillator battery is seen in left infraclavicular region with tips of leads in right atrium and right ventricle. IMPRESSION: Cardiomegaly. Central pulmonary vessels are prominent suggesting mild CHF. No focal pulmonary consolidation is seen. There is no significant pleural effusion. Electronically Signed   By: Elmer Picker M.D.   On: 10/30/2022 16:10   US ABDOMEN LIMITED RUQ (LIVER/GB)  Result Date:  10/27/2022 CLINICAL DATA:  Right upper quadrant pain EXAM: ULTRASOUND ABDOMEN LIMITED RIGHT UPPER QUADRANT COMPARISON:  Same day CT FINDINGS: Gallbladder: No gallstones or wall thickening visualized. No sonographic Murphy sign noted by sonographer. Common bile duct: Diameter: 4 mm. Liver: No focal lesion identified. Within normal limits in parenchymal echogenicity. Portal vein is patent on color Doppler imaging with normal direction of blood flow towards the liver. Other: None. IMPRESSION: Unremarkable right upper quadrant ultrasound. Electronically Signed   By: Davina Poke D.O.   On: 10/27/2022 10:40   CT ABDOMEN PELVIS WO CONTRAST  Result Date: 10/27/2022 CLINICAL DATA:  Nausea and dizziness with abdomen pain. EXAM: CT ABDOMEN AND PELVIS WITHOUT CONTRAST TECHNIQUE: Multidetector CT imaging of the abdomen and pelvis was performed following the standard protocol without IV contrast. RADIATION DOSE REDUCTION: This exam was performed according to the departmental dose-optimization program which includes automated exposure control, adjustment of the mA and/or kV according to patient size and/or use of iterative reconstruction technique. COMPARISON:  November 27, 2019 FINDINGS: Lower chest: Minimal scarring atelectasis of bilateral lung bases are noted. The heart size is enlarged. Hepatobiliary: No focal liver abnormality is seen. No gallstones, gallbladder wall thickening, or biliary dilatation. Pancreas: Unremarkable. No pancreatic ductal dilatation or surrounding inflammatory changes. Small focal calcification identified in the pancreatic head unchanged compared prior exam. Spleen: Calcified granulomas identified in the spleen. The spleen is normal in size. Adrenals/Urinary Tract: The left kidney is atrophic. Stable 1.2 cm right kidney cyst is identified, best seen on coronal view image 51 series 5. No follow-up is recommended. There is no hydronephrosis bilaterally. The bilateral adrenal glands are normal.  The bladder is partially decompressed without gross abnormality. Stomach/Bowel: Stomach is within normal limits. The appendix is not seen but no inflammation is noted around cecum. No evidence of bowel wall thickening, distention, or inflammatory changes. Diverticulosis of colon. Vascular/Lymphatic: Atherosclerosis of the aorta. Status post stent graft repair of the infrarenal aortic aneurysm. Aneurysm measures 3.4 cm on current exam unchanged. No abdominal or pelvic lymphadenopathy is noted. Reproductive: Prostate is unremarkable. Other: Right inguinal herniation of mesenteric fat is noted. Musculoskeletal: Degenerative joint changes of the spine noted. IMPRESSION: 1. No acute abnormality identified in the abdomen and pelvis. 2.  Status post stent graft repair of the infrarenal aortic aneurysm. Aneurysm measures 3.4 cm on current exam unchanged. 3. Aortic atherosclerosis. Aortic Atherosclerosis (ICD10-I70.0). Electronically Signed   By: Abelardo Diesel M.D.   On: 10/27/2022 08:38   DG Chest 2 View  Result Date: 10/27/2022 CLINICAL DATA:  Shortness of breath EXAM: CHEST - 2 VIEW COMPARISON:  October 15, 2022 FINDINGS: The heart size and mediastinal contours are stable. Heart size is enlarged. Cardiac pacemaker is unchanged. Mild increased pulmonary interstitium is identified bilaterally. No focal pneumonia or pleural effusion is noted. The visualized skeletal structures are stable. IMPRESSION: Mild congestive heart failure. Electronically Signed   By: Abelardo Diesel M.D.   On: 10/27/2022 08:26   CT HEAD WO CONTRAST (5MM)  Result Date: 10/27/2022 CLINICAL DATA:  Mental status change with unknown cause. EXAM: CT HEAD WITHOUT CONTRAST TECHNIQUE: Contiguous axial images were obtained from the base of the skull through the vertex without intravenous contrast. RADIATION DOSE REDUCTION: This exam was performed according to the departmental dose-optimization program which includes automated exposure control, adjustment  of the mA and/or kV according to patient size and/or use of iterative reconstruction technique. COMPARISON:  12/07/2017.  CTA of the neck 07/03/2022. FINDINGS: Brain: No evidence of acute infarction, hemorrhage, hydrocephalus, extra-axial collection or mass lesion/mass effect. 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 Small remote right cerebellar infarct. Generalized atrophy. Vascular: No hyperdense vessel or unexpected calcification. Skull: Remote left calvarial fracture with healed mild depression. Sinuses/Orbits: Extensive paranasal sinus disease with medial bulging of the medial maxillary sinus walls, progressed from 2019. Essentially all of the paranasal sinuses are involved. Bilateral cataract resection. No evidence of orbital inflammation. IMPRESSION: 1. No acute finding. 2. Atrophy and remote left occipital infarct. 3. Advanced generalized sinusitis. Electronically Signed   By: Jorje Guild M.D.   On: 10/27/2022 08:21   DG Chest 2 View  Result Date: 10/15/2022 CLINICAL DATA:  Shortness of breath and orthopnea. EXAM: CHEST - 2 VIEW COMPARISON:  08/09/2020 FINDINGS: There is a left chest wall ICD with leads in the right atrial appendage and right ventricle. Previous median sternotomy and CABG procedure. Stable cardiac enlargement. No pleural effusion or interstitial edema. Mild coarsened interstitial markings appear similar. Visualized osseous structures are notable for remote healed right clavicle fracture. IMPRESSION: 1. No acute cardiopulmonary abnormalities. 2. Stable cardiac enlargement. Electronically Signed   By: Kerby Moors M.D.   On: 10/15/2022 10:58    Labs: Basic Metabolic Panel: Recent Labs  Lab 10/30/22 1540 10/30/22 1844 10/31/22 0419 10/31/22 1429 11/01/22 0504 11/02/22 0442  NA 138  --  140 137 139 143  K 5.3*  --  5.6* 4.4 4.2 4.1  CL 107  --  108 104 108 111  CO2 22  --  '24 22 24 24  '$ GLUCOSE 130*  --  103* 120* 119* 99  BUN 80*  --  91* 89* 88*  71*  CREATININE 4.28* 4.37* 4.83* 4.55* 3.94* 2.85*  CALCIUM 9.1  --  8.9 8.6* 8.5* 9.0   CBC: Recent Labs  Lab 10/27/22 0705 10/30/22 1540 10/31/22 0419 11/01/22 0504 11/02/22 0442  WBC 6.2 5.0 6.1 6.0 5.1  NEUTROABS  --  3.0  --   --   --   HGB 12.4* 13.2 12.4* 12.4* 12.5*  HCT 38.6* 42.2 38.9* 38.2* 39.5  MCV 100.3* 102.9* 103.2* 100.0 100.5*  PLT 137* 127* 125* 120* 120*   Microbiology: Results for orders placed or performed during the hospital encounter of  10/27/22  Resp Panel by RT-PCR (Flu A&B, Covid) Anterior Nasal Swab     Status: None   Collection Time: 10/27/22 10:58 AM   Specimen: Anterior Nasal Swab  Result Value Ref Range Status   SARS Coronavirus 2 by RT PCR NEGATIVE NEGATIVE Final    Comment: (NOTE) SARS-CoV-2 target nucleic acids are NOT DETECTED.  The SARS-CoV-2 RNA is generally detectable in upper respiratory specimens during the acute phase of infection. The lowest concentration of SARS-CoV-2 viral copies this assay can detect is 138 copies/mL. A negative result does not preclude SARS-Cov-2 infection and should not be used as the sole basis for treatment or other patient management decisions. A negative result may occur with  improper specimen collection/handling, submission of specimen other than nasopharyngeal swab, presence of viral mutation(s) within the areas targeted by this assay, and inadequate number of viral copies(<138 copies/mL). A negative result must be combined with clinical observations, patient history, and epidemiological information. The expected result is Negative.  Fact Sheet for Patients:  EntrepreneurPulse.com.au  Fact Sheet for Healthcare Providers:  IncredibleEmployment.be  This test is no t yet approved or cleared by the [place] FDA and  has been authorized for detection and/or diagnosis of SARS-CoV-2 by FDA under an Emergency Use Authorization (EUA). This EUA will remain  in  effect (meaning this test can be used) for the duration of the COVID-19 declaration under Section 564(b)(1) of the Act, 21 U.S.C.section 360bbb-3(b)(1), unless the authorization is terminated  or revoked sooner.       Influenza A by PCR NEGATIVE NEGATIVE Final   Influenza B by PCR NEGATIVE NEGATIVE Final    Comment: (NOTE) The Xpert Xpress SARS-CoV-2/FLU/RSV plus assay is intended as an aid in the diagnosis of influenza from Nasopharyngeal swab specimens and should not be used as a sole basis for treatment. Nasal washings and aspirates are unacceptable for Xpert Xpress SARS-CoV-2/FLU/RSV testing.  Fact Sheet for Patients: EntrepreneurPulse.com.au  Fact Sheet for Healthcare Providers: IncredibleEmployment.be  This test is not yet approved or cleared by the [place] FDA and has been authorized for detection and/or diagnosis of SARS-CoV-2 by FDA under an Emergency Use Authorization (EUA). This EUA will remain in effect (meaning this test can be used) for the duration of the COVID-19 declaration under Section 564(b)(1) of the Act, 21 U.S.C. section 360bbb-3(b)(1), unless the authorization is terminated or revoked.  Performed at [facility], [address], [place], [place] [address]    Time coordinating discharge: Over 30 minutes  Richarda Osmond, MD  [facility] 11/02/2022, 10:42 AM

## 2022-11-04 ENCOUNTER — Telehealth: Payer: Self-pay | Admitting: *Deleted

## 2022-11-04 DIAGNOSIS — J9621 Acute and chronic respiratory failure with hypoxia: Secondary | ICD-10-CM | POA: Diagnosis not present

## 2022-11-04 NOTE — Patient Outreach (Signed)
  Care Coordination [facility] Note Transition Care Management Follow-up Telephone Call Date of discharge and from where: [facility] [phone] How have you been since you were released from the hospital? Better, Patient has cough and some shortness of breath with exertion Any questions or concerns? Yes I want to know what they are going to do about this Items Reviewed: Did the pt receive and understand the discharge instructions provided? No  Medications obtained and verified? Yes RN reviewed al medications Other? No  Any new allergies since your discharge? No  Dietary orders reviewed? Yes Cardiac diet Do you have support at home? Yes   Home Care and Equipment/Supplies: Were home health services ordered? no If so, what is the name of the agency? N/a   Has the agency set up a time to come to the patient's home? no Were any new equipment or medical supplies ordered?  Yes: portable O2 What is the name of the medical supply agency? [employer] Were you able to get the supplies/equipment? yes Do you have any questions related to the use of the equipment or supplies? No  Functional Questionnaire: (I = Independent and D = Dependent) ADLs: I  Bathing/Dressing- I  Meal Prep- D  Eating- I  Maintaining continence- I  Transferring/Ambulation- I  Managing Meds- I  Follow up appointments reviewed:  PCP Hospital f/u appt confirmed? No   Specialist Hospital f/u appt confirmed? No  . Are transportation arrangements needed? No  If their condition worsens, is the pt aware to call PCP or go to the Emergency Dept.? Yes Was the patient provided with contact information for the PCP's office or ED? Yes Was to pt encouraged to call back with questions or concerns? Yes  SDOH assessments and interventions completed:   Yes SDOH Interventions Today    Flowsheet Row Most Recent Value  SDOH Interventions   Food Insecurity Interventions Intervention Not Indicated  Housing Interventions Intervention Not Indicated   Transportation Interventions Intervention Not Indicated       Care Coordination Interventions:  PCP follow up appointment requested Referred for Care Coordination Services:  RN Care Coordinator  [place] [phone] 3PM  Encounter Outcome:  Pt. Visit Completed    [place] [facility] [phone]

## 2022-11-05 ENCOUNTER — Ambulatory Visit: Payer: Medicare Other

## 2022-11-05 ENCOUNTER — Telehealth: Payer: Self-pay | Admitting: *Deleted

## 2022-11-05 DIAGNOSIS — I4901 Ventricular fibrillation: Secondary | ICD-10-CM | POA: Diagnosis not present

## 2022-11-05 DIAGNOSIS — I10 Essential (primary) hypertension: Secondary | ICD-10-CM

## 2022-11-05 DIAGNOSIS — I5020 Unspecified systolic (congestive) heart failure: Secondary | ICD-10-CM

## 2022-11-05 NOTE — Progress Notes (Unsigned)
Chronic Care Management Pharmacy Note  11/06/2022 Name:  Taylor [name] MRN:  [id number] DOB:  12/28/1940  Summary: Patient presents for CCM follow-up.   Still having significant shortness of breath. Laying down, using one pillow  Home O2 sensor has not been working, patient thinks it may be related to cold hands.  -Current home daily weights: Weight steady around 181. Reports dry weight is closer to 173. Patient reports one episode of chest pain this morning. Rates the pain around 2-3/10, lasting 2 minutes. Chest pain occurred at rest. He is also reporting low O2 readings. Patient has not been taking his torsemide at home.   Recommendations/Changes made from today's visit: -After discussion with PCP, will have patient START torsemide 60 mg daily until PCP follow-up on Friday. Discussed return precautions for patient, who is adamant about not returning to the hospital unless he hears back about his valve replacement.  Plan: HC follow-up one week  CPP follow-up one month.   Subjective: Taylor [name] is an 81 y.o. year old male who is a primary patient of [name], [name], MD.  The CCM team was consulted for assistance with disease management and care coordination needs.    Engaged with patient by telephone for follow up visit in response to provider referral for pharmacy case management and/or care coordination services.   Consent to Services:  The patient was given information about Chronic Care Management services, agreed to services, and gave verbal consent prior to initiation of services.  Please see initial visit note for detailed documentation.   Patient Care Team: Birdie Sons, MD as PCP - General (Family Medicine) Corey Skains, MD as Consulting Physician (Cardiology) Birder Robson, MD as Referring Physician (Ophthalmology) Ottie Glazier, MD as Consulting Physician (Pulmonary Disease) Germaine Pomfret, [facility] (Pharmacist)  Recent office visits: 10/30/22:  Patient presented to Tally Joe, FNP.  10/28/22: Patient presented to Dr. [name] Section for HF follow-up. 10/21/22: Patient presented to Dr. [name] Section for HF follow-up. Torsemide 20 mg daily.  10/15/22: Patient presented to [name], PA-C for orthopnea.  08/06/22: Patient presented to Dr. Alba Cory for dizziness. Losartan 25.   Recent consult visits: 09/10/22: Patient presented to Leroy Libman, [place] (Cardiology) for follow-up. Torsemide 40 mg daily. Carvedilol 12.5 mg twice daily.   Hospital visits: 12/6-12/9: Patient hospitalized for heart failure.  09/27/22: Patient presented to ED for sinusitis. Doxycycline.  07/23/22: Patient hospitalized for Carotid PTA/Stent.   Objective:  Lab Results  Component Value Date   CREATININE 2.85 (H) 11/02/2022   BUN 71 (H) 11/02/2022   GFRNONAA 22 (L) 11/02/2022   GFRAA 60 11/21/2020   NA 143 11/02/2022   K 4.1 11/02/2022   CALCIUM 9.0 11/02/2022   CO2 24 11/02/2022   GLUCOSE 99 11/02/2022    Lab Results  Component Value Date/Time   HGBA1C 6.5 (H) 08/06/2022 09:59 AM   HGBA1C 6.5 (H) 03/26/2022 02:03 PM    Last diabetic Eye exam:  Lab Results  Component Value Date/Time   HMDIABEYEEXA No Retinopathy 01/07/2022 12:00 AM    Last diabetic Foot exam: No results found for: "HMDIABFOOTEX"   Lab Results  Component Value Date   CHOL 112 12/11/2021   HDL 38 (L) 12/11/2021   LDLCALC 54 12/11/2021   TRIG 107 12/11/2021   CHOLHDL 2.9 12/11/2021       Latest Ref Rng & Units 10/30/2022    3:40 PM 10/27/2022    7:05 AM 10/15/2022   10:14 AM  Hepatic Function  Total  Protein 6.5 - 8.1 g/dL 7.2  7.2  7.0   Albumin 3.5 - 5.0 g/dL 3.5  3.5  4.1   AST 15 - 41 U/L 48  24  20   ALT 0 - 44 U/L _0 Alk Phosphatase 38 - 126 U/L 114  131  129   Total Bilirubin 0.3 - 1.2 mg/dL 2.1  1.7  1.3   Bilirubin, Direct 0.0 - 0.2 mg/dL  0.4      Lab Results  Component Value Date/Time   TSH 2.010 03/26/2022 02:03 PM   TSH 1.331 11/23/2019 04:58 AM    TSH 3.430 05/15/2015 09:08 AM       Latest Ref Rng & Units 11/02/2022    4:42 AM 11/01/2022    5:04 AM 10/31/2022    4:19 AM  CBC  WBC 4.0 - 10.5 K/uL 5.1  6.0  6.1   Hemoglobin 13.0 - 17.0 g/dL 12.5  12.4  12.4   Hematocrit 39.0 - 52.0 % 39.5  38.2  38.9   Platelets 150 - 400 K/uL 120  120  125     Lab Results  Component Value Date/Time   VD25OH 46.58 04/19/2020 04:16 PM   VD25OH 47.4 05/15/2015 09:08 AM    Clinical ASCVD: Yes  The ASCVD Risk score (Arnett DK, et al., 2019) failed to calculate for the following reasons:   The 2019 ASCVD risk score is only valid for ages 31 to 73   The patient has a prior MI or stroke diagnosis       08/06/2022    9:00 AM 05/07/2022   11:20 AM 03/29/2022    2:01 PM  Depression screen PHQ 2/9  Decreased Interest 0 1 1  Down, Depressed, Hopeless 0 1 1  PHQ - 2 Score 0 2 2  Altered sleeping  0 0  Tired, decreased energy  2 3  Change in appetite  0 0  Feeling bad or failure about yourself   0 1  Trouble concentrating  0 0  Moving slowly or fidgety/restless  0 0  Suicidal thoughts  0 0  PHQ-9 Score  4 6  Difficult doing work/chores  Not difficult at all Not difficult at all    Social History   Tobacco Use  Smoking Status Former   Packs/day: 1.00   Types: Cigarettes   Quit date: 11/26/1999   Years since quitting: 22.9  Smokeless Tobacco Never   BP Readings from Last 3 Encounters:  11/02/22 (!) 150/94  10/30/22 (!) 140/70  10/28/22 107/75   Pulse Readings from Last 3 Encounters:  11/02/22 71  10/30/22 61  10/28/22 65   Wt Readings from Last 3 Encounters:  11/02/22 185 lb 10 oz (84.2 kg)  10/30/22 189 lb (85.7 kg)  10/28/22 188 lb (85.3 kg)   BMI Readings from Last 3 Encounters:  11/02/22 26.63 kg/m  10/30/22 27.12 kg/m  10/28/22 26.98 kg/m    Assessment/Interventions: Review of patient past medical history, allergies, medications, health status, including review of consultants reports, laboratory and other test data,  was performed as part of comprehensive evaluation and provision of chronic care management services.   SDOH:  (Social Determinants of Health) assessments and interventions performed: Yes SDOH Interventions    Flowsheet Row Telephone from 11/04/2022 in [place] from 05/07/2022 in [place] Management from 01/21/2022 in [place] Visit from 12/11/2021 in [place]  Care Management from 06/04/2021 in [place] Visit from 05/08/2021 in [place] Interventions        Food Insecurity Interventions Intervention Not Indicated Intervention Not Indicated -- -- -- --  Housing Interventions Intervention Not Indicated Intervention Not Indicated -- -- -- --  Transportation Interventions Intervention Not Indicated Intervention Not Indicated -- -- -- --  Depression Interventions/Treatment  -- Medication, Currently on Treatment -- PHQ2-9 Score <4 Follow-up Not Indicated -- PHQ2-9 Score <4 Follow-up Not Indicated  Financial Strain Interventions -- Intervention Not Indicated Intervention Not Indicated -- Intervention Not Indicated --  Physical Activity Interventions -- Patient Refused -- -- -- --  Stress Interventions -- Intervention Not Indicated -- -- -- --  Social Connections Interventions -- Intervention Not Indicated -- -- -- --        SDOH Screenings   Food Insecurity: No Food Insecurity (11/04/2022)  Housing: Low Risk  (11/04/2022)  Transportation Needs: No Transportation Needs (11/04/2022)  Utilities: Not At Risk (10/31/2022)  Alcohol Screen: Low Risk  (05/07/2022)  Depression (PHQ2-9): Low Risk  (08/06/2022)  Financial Resource Strain: Low Risk  (05/07/2022)  Physical Activity: Insufficiently Active (04/18/2021)  Social Connections: Moderately Isolated (05/07/2022)  Stress: No Stress Concern Present (05/07/2022)  Tobacco  Use: Medium Risk (10/31/2022)    CCM Care Plan  Allergies  Allergen Reactions   Amlodipine Besylate Swelling    Had a reaction when taking with colcrys    Crestor [Rosuvastatin]     Muscle cramps and pain   Rocephin [Ceftriaxone]     unknown    Medications Reviewed Today     Reviewed by Verlin Grills, RN (Case Manager) on 11/04/22 at 1417  Med List Status: <None>   Medication Order Taking? Sig Documenting Provider Last Dose Status Informant  allopurinol (ZYLOPRIM) 300 MG tablet [id number] No Take 0.5 tablets (150 mg total) by mouth daily. Birdie Sons, MD 10/30/2022 Active Spouse/Significant Other  apixaban (ELIQUIS) 2.5 MG TABS tablet [id number]  Take 2 tablets (5 mg total) by mouth 2 (two) times daily. Richarda Osmond, MD  Active   aspirin 81 MG EC tablet [id number] No Take 81 mg by mouth daily.  [provider] 10/30/2022 Active Spouse/Significant Other           Med Note Michaelle Birks, Aylani Spurlock A   Wed Jan 24, 2021  8:17 AM)    atorvastatin (LIPITOR) 40 MG tablet [id number] No TAKE 1 TABLET(40 MG) BY MOUTH DAILY Birdie Sons, MD 10/30/2022 Active Spouse/Significant Other  Calcium Carbonate Antacid 600 MG chewable tablet [id number] No Chew 1 tablet by mouth daily. [provider] prn Active Spouse/Significant Other  carvedilol (COREG) 12.5 MG tablet [id number] No Take 12.5 mg by mouth 2 (two) times daily with a meal. [provider] 10/30/2022 Active Spouse/Significant Other  glucose blood (ONETOUCH VERIO) test strip [id number] No Use as instructed to check sugar daily [name], [name], MD Taking Active Spouse/Significant Other  isosorbide mononitrate (IMDUR) 30 MG 24 hr tablet [id number]  Take 0.5 tablets (15 mg total) by mouth daily. Richarda Osmond, MD  Active   Magnesium Oxide 250 MG TABS [id number] No Take 250 mg by mouth daily. [provider] 10/30/2022 Active Spouse/Significant Other  pantoprazole (PROTONIX) 40 MG tablet [id number] No TAKE  1 TABLET BY MOUTH EVERY DAY Birdie Sons, MD 10/30/2022 Active Spouse/Significant Other  torsemide (DEMADEX) 20 MG tablet [id number] No Take 40 mg by mouth daily. [provider] 10/30/2022 Active Spouse/Significant Other  Med Note Michaelle Birks, Kristine Tiley A   Tue Jan 23, 2021  2:10 PM) Prescribed by Dr. Nehemiah Massed   triamcinolone ointment (KENALOG) 0.1 % [id number] No APPLY TOPICALLY TWICE DAILY AS DIRECTED Birdie Sons, MD prn Active Spouse/Significant Other            Patient Active Problem List   Diagnosis Date Noted   Mitral valve insufficiency 10/31/2022   HFrEF (heart failure with reduced ejection fraction) ([place]) 10/31/2022   Acute on chronic respiratory failure with hypoxia ([place]) 10/31/2022   Weakness 10/30/2022   Acute on chronic congestive heart failure ([place]) 10/30/2022   Acute pulmonary edema ([place]) 10/30/2022   DOE (dyspnea on exertion) 10/30/2022   Hypoxia 10/30/2022   Congestive heart failure (CHF) ([place]) 10/30/2022   Elevated d-dimer 10/30/2022   Abdominal aortic aneurysm (AAA) ([place]) 10/30/2022   AKI (acute kidney injury) ([place]) 10/30/2022   Dyspnea 10/30/2022   Orthopnea 10/15/2022   Acute on chronic systolic congestive heart failure ([place]) 10/15/2022   Dizziness on standing 08/06/2022   Carotid stenosis, symptomatic w/o infarct, left 07/24/2022   Symptomatic carotid artery narrowing without infarction, left 07/05/2022   Dizziness 03/26/2022   Atrophy of kidney 09/27/2021   COVID-19 09/27/2021   Heart failure, unspecified ([place]) 08/22/2021   Other abnormal findings in urine 08/22/2021   Other specified complication of vascular prosthetic devices, implants and grafts, initial encounter ([place]) 08/22/2021   Atherosclerotic heart disease of native coronary artery with unspecified angina pectoris ([place]) 08/22/2021   Implantable cardioverter-defibrillator (ICD) in situ 08/22/2021   Stage 3b chronic kidney disease ([place]) 08/22/2021   Type 2 diabetes mellitus  with diabetic chronic kidney disease ([place]) 08/22/2021   Hypertension 08/22/2021   Idiopathic gout, unspecified site 08/22/2021   Pure hypercholesterolemia, unspecified 08/22/2021   Other psoriasis 08/22/2021   Aortic atherosclerosis ([place]) 05/08/2021   Abdominal pain 03/01/2021   Diabetes mellitus without complication ([place]) 09/18/8526   Diarrhea 09/12/2020   Acute kidney injury superimposed on CKD ([place]) 08/10/2020   Hypomagnesemia 04/26/2020   Hypocalcemia 04/19/2020   Numbness and tingling in both hands 04/19/2020   Atrial fibrillation ([place]) 04/19/2020   Other specified interstitial pulmonary diseases ([place]) 12/06/2019   Abdominal pain    Pancreatitis 11/22/2019   CAP (community acquired pneumonia) 11/13/2019   Hyperuricemia 03/31/2019   Numbness 05/07/2018   Benign colonic polyp 03/24/2017   Tibialis anterior tenosynovitis 12/21/2015   Pleural nodule 07/27/2015   Psoriatic arthritis ([place]) 05/12/2015   Restrictive lung disease 05/12/2015   Barrett esophagus 03/30/2015   Ache in joint 03/30/2015   Bradycardia 03/30/2015   CAD S/P percutaneous coronary angioplasty 03/30/2015   Cardiac defibrillator in place 03/30/2015   Chronic kidney disease (CKD), stage III (moderate) ([place]) 03/30/2015   Cluster headache syndrome 03/30/2015   Degeneration of lumbar or lumbosacral intervertebral disc 03/30/2015   Gout 03/30/2015   Personal history of malignant neoplasm of bladder 03/30/2015   Hypercholesteremia 03/30/2015   LBP (low back pain) 03/30/2015   Personal history of malignant melanoma of skin 03/30/2015   Muscle ache 03/30/2015   Arthritis of knee, degenerative 03/30/2015   Psoriasis 03/30/2015   Obstructive sleep apnea 03/30/2015   Chronic venous insufficiency 03/30/2015   Low back pain 03/30/2015   History of abdominal aortic aneurysm (AAA) 06/24/2013   History of CVA (cerebrovascular accident) 06/24/2013   Cardiomyopathy, ischemic 06/24/2013   History of AAA (abdominal aortic  aneurysm) repair 06/24/2013   Essential (primary) hypertension 08/17/2011   Arrhythmia, sinus node 08/17/2011   History of  ventricular fibrillation 62/69/4854   Systolic CHF with reduced left ventricular function, NYHA class 3 ([place]) 02/26/2010   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 04/27/2007   Cardiac pacemaker in situ 10/10/2006    Immunization History  Administered Date(s) Administered   Fluad Quad(high Dose 65+) 10/04/2020, 08/06/2022   Influenza Split 09/18/2011   Influenza, High Dose Seasonal PF 08/16/2014, 10/09/2016, 10/09/2017, 08/26/2018   Influenza,inj,Quad PF,6+ Mos 12/20/2015   PFIZER(Purple Top)SARS-COV-2 Vaccination 02/24/2020, 03/21/2020   Pneumococcal Conjugate-13 05/09/2014   Pneumococcal Polysaccharide-23 05/12/2015    Conditions to be addressed/monitored:  Hypertension, Hyperlipidemia, Diabetes, Atrial Fibrillation, Heart Failure, Coronary Artery Disease, Chronic Kidney Disease and Gout  Care Plan : General Pharmacy (Adult)  Updates made by Germaine Pomfret, RPH since 11/06/2022 12:00 AM     Problem: Hypertension, Hyperlipidemia, Diabetes, Atrial Fibrillation, Heart Failure, Coronary Artery Disease, Chronic Kidney Disease and Gout   Priority: High     Long-Range Goal: Patient-Specific Goal   Start Date: 01/23/2021  Expected End Date: 11/07/2023  This Visit's Progress: On track  Recent Progress: On track  Priority: High  Note:   Current Barriers:  Unable to independently afford treatment regimen Unable to achieve control of diabetes   Pharmacist Clinical Goal(s):  Over the next 90 days, patient will verbalize ability to afford treatment regimen achieve control of diabetes as evidenced by A1c less than 8% through collaboration with PharmD and provider.   Interventions: 1:1 collaboration with Birdie Sons, MD regarding development and update of comprehensive plan of care as evidenced by provider attestation and  co-signature Inter-disciplinary care team collaboration (see longitudinal plan of care) Comprehensive medication review performed; medication list updated in electronic medical record  Heart Failure (Goal: control symptoms and prevent exacerbations) -Uncontrolled Type: Systolic -NYHA Class: III (marked limitation of activity) -Ejection fraction: 35-40% (Date: Sep 2021) -Current treatment: Carvedilol 12.5 mg twice daily  Imdur 30 mg 1/2 tablet daily  Torsemide 40 mg daily  -Medications previously tried: Entresto, Amlodipine, valsartan, spironolactone, Carvedilol -Losartan stopped during hospitalization due to AKI.  -Current home BP/HR readings: Did not address today.  -Current home daily weights: Weight steady around 181. Reports dry weight is closer to 173. Patient reports one episode of chest pain this morning. Rates the pain around 2-3/10, lasting 2 minutes. Chest pain occurred at rest. He is also reporting low O2 readings. Patient has not been taking his torsemide at home.  -After discussion with PCP, will have patient START torsemide 60 mg daily until PCP follow-up on Friday. Discussed return precautions for patient, who is adamant about not returning to the hospital unless he hears back about his valve replacement.  Hyperlipidemia: (LDL goal < 70) -History of CAD, history of CVA  -Controlled -Current treatment: Atorvastatin 40 mg daily  -Medications previously tried: NA  -Educated on Benefits of statin for ASCVD risk reduction; Importance of limiting foods high in cholesterol; -Recommended to continue current medication  Diabetes (A1c goal <8%) -Diagnosed Jan 2021 -Controlled -Current medications: None -Medications previously tried: NA  -Current home glucose readings fasting glucose: 120 -Denies hypoglycemic/hyperglycemic symptoms  -Current meal patterns: cut out sodas. Replaced with twist water. 2 sandwiches with lunch down to 1.  -Continue current medications   Atrial  Fibrillation (Goal: prevent stroke and major bleeding) -Controlled -CHADSVASC: 8 -Current treatment: Rate control: Carvedilol 6.25 mg twice daily: Appropriate, Effective, Safe, Accessible Anticoagulation: Eliquis 2.5 mg daily: Appropriate, Effective, Safe, Accessible   -Medications previously tried: NA -Home BP and HR readings: NA  -Continue current  medications  Gout (Goal: prevent gout flares) -Controlled -Last Uric acid elevated above goal, but patient asymptomatic.  -Current treatment  Allopurinol 300 mg 1/2 tablets daily  -Medications previously tried: NA  -Last flare: 2 years  -Counseled on diet and exercise extensively Recommended to continue current medication  GERD (Goal: Minimize symptoms of heartburn or reflux) -History of Barrett's Esophagus  -Controlled -Current treatment  Pantoprazole 40 mg daily  -Medications previously tried: NA  -Recommended to continue current medication  Chronic Kidney Disease Stage 3b  -All medications assessed for renal dosing and appropriateness in chronic kidney disease. -Could consider SGLT2 inhibitor in the future.  -Recommended to continue current medication  Patient Goals/Self-Care Activities Over the next 90 days, patient will:  - check glucose daily before breakfast, document, and provide at future appointments check blood pressure 2-3 times weekly , document, and provide at future appointments weigh daily, and contact provider if weight gain of greater than 3 pounds in one day  Follow Up Plan: Telephone follow up appointment with care management team member scheduled for:  12/03/2022 at 3:45 PM      Medication Assistance: None required.  Patient affirms current coverage meets needs.  Compliance/Adherence/Medication fill history: Care Gaps: Ophthalmology Exam  Shingrix, Covid, Pneumonia Vaccines   Star-Rating Drugs: Atorvastatin 40 mg: LF 12/11/21 for 90-DS Losartan 50 mg: LF 01/10/22 for 90-DS Metformin XR: LF 12/11/21 for  90-DS  Patient's preferred pharmacy is:  [id number] - [address], [address] AT [address] [place] [address] Phone: [phone] Fax: [phone]   Uses pill box? Yes Pt endorses 100% compliance  We discussed: Current pharmacy is preferred with insurance plan and patient is satisfied with pharmacy services Patient decided to: Continue current medication management strategy  Care Plan and Follow Up Patient Decision:  Patient agrees to Care Plan and Follow-up.  Plan: Telephone follow up appointment with care management team member scheduled for:  12/03/2022 at 3:45 PM  [name], PharmD, [place], Portal [phone]

## 2022-11-05 NOTE — Progress Notes (Signed)
  Care Coordination  Note  11/05/2022 Name: Taylor [name] MRN: [id number] DOB: 05-17-1941  Taylor [name] is a 81 y.o. year old primary care patient of [name] Section, [name], MD. I reached out to [name] by phone today to assist with scheduling a follow up appointment. [name] verbally consented to my assistance.       Follow up plan: Hospital Follow Up appointment scheduled with (Dr [name] Section) on (11/08/2022) at (920am).  Julian Hy, [place] Direct Dial: [phone]

## 2022-11-06 ENCOUNTER — Telehealth: Payer: Self-pay

## 2022-11-06 NOTE — Progress Notes (Signed)
Per Clinical pharmacist, Please reach out to [place]. [place] this morning. I spoke with Dr. [name] Section and we would like him to take three tablets of Torsemide (60 mg total) each morning until he sees Dr. [name] Section on Friday. Please follow up with him regarding HF in one week.   Patient reports his oxygen level is 82 as he just check it.Patient reports symptoms of cough,chest pain when he coughs and Shortness of breath. Patient states he almost pass out this morning when he stood up. Notified Clinical pharmacist.  Per Clinical pharmacist, Torsemide will help get the fluid off his chest off so he can breath better. Dr. [name] Section and I both think that is a very important medicine for him to take so he feels better and doesn't go back to the hospital. If his symptoms continue to worsen he should return to the ED.   Patient verbalized understanding, and agreed to take Torsemide three tablets each morning until he see his PCP. Patient confirms he has adequate supply on hand.Patient is aware I will be reaching out to him in 1 week to follow up.  [place] Pharmacist Assistant [phone]

## 2022-11-06 NOTE — Patient Instructions (Signed)
Visit Information It was great speaking with you today!  Please let me know if you have any questions about our visit.   Goals Addressed             This Visit's Progress    Monitor and Manage My Blood Sugar-Diabetes Type 2   On track    Timeframe:  Long-Range Goal Priority:  High Start Date:  01/23/2021                           Expected End Date: 07/27/2023                  Follow Up within 90 days   -Check blood sugar once daily, before breakfast  - check blood sugar if I feel it is too high or too low - enter blood sugar readings and medication or insulin into daily log    Why is this important?   Checking your blood sugar at home helps to keep it from getting very high or very low.  Writing the results in a diary or log helps the doctor know how to care for you.  Your blood sugar log should have the time, date and the results.  Also, write down the amount of insulin or other medicine that you take.  Other information, like what you ate, exercise done and how you were feeling, will also be helpful.     Notes:         Patient Care Plan: General Pharmacy (Adult)     Problem Identified: Hypertension, Hyperlipidemia, Diabetes, Atrial Fibrillation, Heart Failure, Coronary Artery Disease, Chronic Kidney Disease and Gout   Priority: High     Long-Range Goal: Patient-Specific Goal   Start Date: 01/23/2021  Expected End Date: 11/07/2023  This Visit's Progress: On track  Recent Progress: On track  Priority: High  Note:   Current Barriers:  Unable to independently afford treatment regimen Unable to achieve control of diabetes   Pharmacist Clinical Goal(s):  Over the next 90 days, patient will verbalize ability to afford treatment regimen achieve control of diabetes as evidenced by A1c less than 8% through collaboration with PharmD and provider.   Interventions: 1:1 collaboration with Birdie Sons, MD regarding development and update of comprehensive plan of care as  evidenced by provider attestation and co-signature Inter-disciplinary care team collaboration (see longitudinal plan of care) Comprehensive medication review performed; medication list updated in electronic medical record  Heart Failure (Goal: control symptoms and prevent exacerbations) -Uncontrolled Type: Systolic -NYHA Class: III (marked limitation of activity) -Ejection fraction: 35-40% (Date: Sep 2021) -Current treatment: Carvedilol 12.5 mg twice daily  Imdur 30 mg 1/2 tablet daily  Torsemide 40 mg daily  -Medications previously tried: Entresto, Amlodipine, valsartan, spironolactone, Carvedilol -Losartan stopped during hospitalization due to AKI.  -Current home BP/HR readings: Did not address today.  -Current home daily weights: Weight steady around 181. Reports dry weight is closer to 173. Patient reports one episode of chest pain this morning. Rates the pain around 2-3/10, lasting 2 minutes. Chest pain occurred at rest. He is also reporting low O2 readings. Patient has not been taking his torsemide at home.  -After discussion with PCP, will have patient START torsemide 60 mg daily until PCP follow-up on Friday. Discussed return precautions for patient, who is adamant about not returning to the hospital unless he hears back about his valve replacement.  Hyperlipidemia: (LDL goal < 70) -History of CAD, history of CVA  -  Controlled -Current treatment: Atorvastatin 40 mg daily  -Medications previously tried: NA  -Educated on Benefits of statin for ASCVD risk reduction; Importance of limiting foods high in cholesterol; -Recommended to continue current medication  Diabetes (A1c goal <8%) -Diagnosed Jan 2021 -Controlled -Current medications: None -Medications previously tried: NA  -Current home glucose readings fasting glucose: 120 -Denies hypoglycemic/hyperglycemic symptoms  -Current meal patterns: cut out sodas. Replaced with twist water. 2 sandwiches with lunch down to 1.   -Continue current medications   Atrial Fibrillation (Goal: prevent stroke and major bleeding) -Controlled -CHADSVASC: 8 -Current treatment: Rate control: Carvedilol 6.25 mg twice daily: Appropriate, Effective, Safe, Accessible Anticoagulation: Eliquis 2.5 mg daily: Appropriate, Effective, Safe, Accessible   -Medications previously tried: NA -Home BP and HR readings: NA  -Continue current medications  Gout (Goal: prevent gout flares) -Controlled -Last Uric acid elevated above goal, but patient asymptomatic.  -Current treatment  Allopurinol 300 mg 1/2 tablets daily  -Medications previously tried: NA  -Last flare: 2 years  -Counseled on diet and exercise extensively Recommended to continue current medication  GERD (Goal: Minimize symptoms of heartburn or reflux) -History of Barrett's Esophagus  -Controlled -Current treatment  Pantoprazole 40 mg daily  -Medications previously tried: NA  -Recommended to continue current medication  Chronic Kidney Disease Stage 3b  -All medications assessed for renal dosing and appropriateness in chronic kidney disease. -Could consider SGLT2 inhibitor in the future.  -Recommended to continue current medication  Patient Goals/Self-Care Activities Over the next 90 days, patient will:  - check glucose daily before breakfast, document, and provide at future appointments check blood pressure 2-3 times weekly , document, and provide at future appointments weigh daily, and contact provider if weight gain of greater than 3 pounds in one day  Follow Up Plan: Telephone follow up appointment with care management team member scheduled for:  12/03/2022 at 3:45 PM    Patient agreed to services and verbal consent obtained.   Patient verbalizes understanding of instructions and care plan provided today and agrees to view in [place]. Active MyChart status and patient understanding of how to access instructions and care plan via MyChart confirmed with patient.      [name], PharmD, [place], CPP  Clinical Pharmacist Practitioner  [facility] [phone]

## 2022-11-07 NOTE — Progress Notes (Signed)
I,Taylor [name],acting as a Education administrator for Taylor Huh, MD.,have documented all relevant documentation on the behalf of Taylor Huh, MD,as directed by  Taylor Huh, MD while in the presence of Taylor Huh, MD.   Established patient visit   Patient: Taylor [name]   DOB: 1941-07-13   81 y.o. Male  MRN: [id number] Visit Date: 11/08/2022  Today's healthcare provider: Lelon Huh, MD    Subjective    HPI  Follow up Hospitalization  Patient was admitted to [facility] on 10/30/2022 and discharged on 11/02/2022. He was treated for Congestive heart failure and AKI.  Treatment for this included IV diuresis and IV fluids.  Echo done during hospitalization showing EF<20%, severe left ventricular dilation, severe mitral regurgitation and moderate to severe tricuspid regurgitation. Also note mild aortic valve regurgitation, but no stenosis.  Telephone follow up was done on 11/05/2022 He states he is now taking 3 torsemide '20mg'$  tablets every day and 1/2 tablet of isosorbide that was prescribed.  He reports excellent compliance with treatment. He reports this condition is improved. Patient reports having a cold since he left the hospital. Symptoms of mucus in throat, nose drainage, SOB, wheezing, dry cough. Patient has taken Mucinex for symptoms with some relief. No fever that patient is aware of.  -----------------------------------------------------------------------------------------    Medications: Outpatient Medications Prior to Visit  Medication Sig   allopurinol (ZYLOPRIM) 300 MG tablet Take 0.5 tablets (150 mg total) by mouth daily.   apixaban (ELIQUIS) 2.5 MG TABS tablet Take 2 tablets (5 mg total) by mouth 2 (two) times daily.   aspirin 81 MG EC tablet Take 81 mg by mouth daily.    atorvastatin (LIPITOR) 40 MG tablet TAKE 1 TABLET(40 MG) BY MOUTH DAILY   Calcium Carbonate Antacid 600 MG chewable tablet Chew 1 tablet by mouth daily.   carvedilol (COREG) 12.5 MG tablet Take 12.5 mg  by mouth 2 (two) times daily with a meal.   glucose blood (ONETOUCH VERIO) test strip Use as instructed to check sugar daily   isosorbide mononitrate (IMDUR) 30 MG 24 hr tablet Take 0.5 tablets (15 mg total) by mouth daily.   Magnesium Oxide 250 MG TABS Take 250 mg by mouth daily.   pantoprazole (PROTONIX) 40 MG tablet TAKE 1 TABLET BY MOUTH EVERY DAY   torsemide (DEMADEX) 20 MG tablet Take 40 mg by mouth daily.   triamcinolone ointment (KENALOG) 0.1 % APPLY TOPICALLY TWICE DAILY AS DIRECTED   No facility-administered medications prior to visit.    Review of Systems  Constitutional:  Negative for appetite change, chills and fever.  Respiratory:  Negative for chest tightness, shortness of breath and wheezing.   Cardiovascular:  Negative for chest pain and palpitations.  Gastrointestinal:  Negative for abdominal pain, nausea and vomiting.       Objective    BP 126/69 (BP Location: Left Arm, Patient Position: Sitting, Cuff Size: Normal)   Pulse 63   Wt 177 lb (80.3 kg)   SpO2 96%   BMI 25.40 kg/m    Physical Exam   General: Appearance:    Well developed, well nourished male in no acute distress  Eyes:    PERRL, conjunctiva/corneas clear, EOM's intact       Lungs:     Clear to auscultation bilaterally, respirations unlabored  Heart:    Normal heart rate. Regularly irregular rhythm.  3/6 blowing, holosystolic murmur at apex   MS:   All extremities are intact.  1+ bilateral foot and lower leg  edema.   Neurologic:   Awake, alert, oriented x 3. No apparent focal neurological defect.         Assessment & Plan     1. Upper respiratory tract infection, unspecified type Likely viral, but cardiac function is very susceptible to cardiac decompensation.   - sulfamethoxazole-trimethoprim (BACTRIM DS) 800-160 MG tablet; Take 1 tablet by mouth 2 (two) times daily for 7 days.  Dispense: 14 tablet; Refill: 0   2. Systolic CHF with reduced left ventricular function, NYHA class 3  (HCC) Multifactorial but recent finding of severe MR is new significant contributing factor.  Currently on '60mg'$  torsemide a day in setting or recent AKI, in addition to recent addition of 1/2 x '30mg'$  isosorbide mononitrate.   - Comprehensive metabolic panel - CBC  Follow up CHF clinic as scheduled. On 11/11/2022. Follow up cardiology is currently scheduled for 11-21-22  3. Severe mitral regurgitation   4. DOE (dyspnea on exertion)  - Brain natriuretic peptide  5. AKI (acute kidney injury) ([place])  - Magnesium     The entirety of the information documented in the History of Present Illness, Review of Systems and Physical Exam were personally obtained by me. Portions of this information were initially documented by the CMA and reviewed by me for thoroughness and accuracy.     Taylor Huh, MD  [facility] [phone] (phone) [phone] (fax)  Taylor

## 2022-11-08 ENCOUNTER — Encounter: Payer: Self-pay | Admitting: Family Medicine

## 2022-11-08 ENCOUNTER — Ambulatory Visit (INDEPENDENT_AMBULATORY_CARE_PROVIDER_SITE_OTHER): Payer: Medicare Other | Admitting: Family Medicine

## 2022-11-08 VITALS — BP 126/69 | HR 63 | Wt 177.0 lb

## 2022-11-08 DIAGNOSIS — J069 Acute upper respiratory infection, unspecified: Secondary | ICD-10-CM

## 2022-11-08 DIAGNOSIS — R0609 Other forms of dyspnea: Secondary | ICD-10-CM

## 2022-11-08 DIAGNOSIS — I340 Nonrheumatic mitral (valve) insufficiency: Secondary | ICD-10-CM

## 2022-11-08 DIAGNOSIS — I5020 Unspecified systolic (congestive) heart failure: Secondary | ICD-10-CM | POA: Diagnosis not present

## 2022-11-08 DIAGNOSIS — N179 Acute kidney failure, unspecified: Secondary | ICD-10-CM | POA: Diagnosis not present

## 2022-11-08 MED ORDER — SULFAMETHOXAZOLE-TRIMETHOPRIM 800-160 MG PO TABS: 1.0000 | ORAL_TABLET | Freq: Two times a day (BID) | ORAL | 0 refills | Status: DC

## 2022-11-09 LAB — COMPREHENSIVE METABOLIC PANEL
ALT: 46 IU/L — ABNORMAL HIGH (ref 0–44)
AST: 65 IU/L — ABNORMAL HIGH (ref 0–40)
Albumin/Globulin Ratio: 1.2 (ref 1.2–2.2)
Albumin: 3.8 g/dL (ref 3.8–4.8)
Alkaline Phosphatase: 139 IU/L — ABNORMAL HIGH (ref 44–121)
BUN/Creatinine Ratio: 19 (ref 10–24)
BUN: 52 mg/dL — ABNORMAL HIGH (ref 8–27)
Bilirubin Total: 2.2 mg/dL — ABNORMAL HIGH (ref 0.0–1.2)
CO2: 26 mmol/L (ref 20–29)
Calcium: 9.4 mg/dL (ref 8.6–10.2)
Chloride: 97 mmol/L (ref 96–106)
Creatinine, Ser: 2.81 mg/dL — ABNORMAL HIGH (ref 0.76–1.27)
Globulin, Total: 3.3 g/dL (ref 1.5–4.5)
Glucose: 130 mg/dL — ABNORMAL HIGH (ref 70–99)
Potassium: 4 mmol/L (ref 3.5–5.2)
Sodium: 142 mmol/L (ref 134–144)
Total Protein: 7.1 g/dL (ref 6.0–8.5)
eGFR: 22 mL/min/{1.73_m2} — ABNORMAL LOW (ref >59)

## 2022-11-09 LAB — BRAIN NATRIURETIC PEPTIDE: BNP: 402.1 pg/mL — ABNORMAL HIGH (ref 0.0–100.0)

## 2022-11-09 LAB — CBC
Hematocrit: 41.5 % (ref 37.5–51.0)
Hemoglobin: 13.6 g/dL (ref 13.0–17.7)
MCH: 32.4 pg (ref 26.6–33.0)
MCHC: 32.8 g/dL (ref 31.5–35.7)
MCV: 99 fL — ABNORMAL HIGH (ref 79–97)
Platelets: 126 10*3/uL — ABNORMAL LOW (ref 150–450)
RBC: 4.2 x10E6/uL (ref 4.14–5.80)
RDW: 14.3 % (ref 11.6–15.4)
WBC: 5.4 10*3/uL (ref 3.4–10.8)

## 2022-11-09 LAB — MAGNESIUM: Magnesium: 1.4 mg/dL — ABNORMAL LOW (ref 1.6–2.3)

## 2022-11-09 NOTE — Progress Notes (Unsigned)
   Patient ID: Taylor [name], male    DOB: 09/06/1941, 81 y.o.   MRN: [id number]  HPI  Taylor [name] is a 81 y/o male with a history of  Echo report from 10/30/22 showed an EF of <20% along with mild LVH, severe LAE, severe Taylor, mild AR and moderate/severe TR.   Admitted 10/30/22 due to DOE and orthopnea x 7 days. Chest xray showed cardiomegaly and central vascular congestion. Cardiology & nephrology consults obtained. Given IV lasix. Cr worsened with diuretics so discontinued and put on gentle fluids in setting of HF. Placed on oxygen. Discharged after 3 days. Was in the ED 10/27/22 due to nausea epigastric discomfort some shortness of breath;   He presents today for his initial visit with a chief complaint of  Review of Systems    Physical Exam  Assessment & Plan:  1: Chronic heart failure with reduced ejection fraction- - NYHA class  - on GDMT of   - has AICD - BNP 10/30/22 was 554.5  2: HTN- - BP - saw PCP [name] Section) 11/08/22 - BMP 11/08/22 reviewed and showed sodium 142, potassium 4.0, creatinine 2.81 and GFR 22  3: Atrial fibrillation- - saw cardiology Albertine Patricia) 09/10/22  4: CAD- - CABG 2007  5: DM with CKD-  - A1c 08/06/22 was 6.5%

## 2022-11-11 ENCOUNTER — Encounter: Payer: Self-pay | Admitting: Family

## 2022-11-11 ENCOUNTER — Other Ambulatory Visit: Payer: Self-pay | Admitting: Family

## 2022-11-11 ENCOUNTER — Ambulatory Visit: Payer: Medicare Other | Attending: Family | Admitting: Family

## 2022-11-11 ENCOUNTER — Encounter: Payer: Self-pay | Admitting: Pharmacist

## 2022-11-11 VITALS — BP 91/74 | HR 62 | Resp 18 | Wt 179.1 lb

## 2022-11-11 DIAGNOSIS — Z955 Presence of coronary angioplasty implant and graft: Secondary | ICD-10-CM | POA: Insufficient documentation

## 2022-11-11 DIAGNOSIS — I5020 Unspecified systolic (congestive) heart failure: Secondary | ICD-10-CM

## 2022-11-11 DIAGNOSIS — I5022 Chronic systolic (congestive) heart failure: Secondary | ICD-10-CM | POA: Diagnosis not present

## 2022-11-11 DIAGNOSIS — Z8673 Personal history of transient ischemic attack (TIA), and cerebral infarction without residual deficits: Secondary | ICD-10-CM | POA: Insufficient documentation

## 2022-11-11 DIAGNOSIS — I130 Hypertensive heart and chronic kidney disease with heart failure and stage 1 through stage 4 chronic kidney disease, or unspecified chronic kidney disease: Secondary | ICD-10-CM | POA: Insufficient documentation

## 2022-11-11 DIAGNOSIS — Z85820 Personal history of malignant melanoma of skin: Secondary | ICD-10-CM | POA: Insufficient documentation

## 2022-11-11 DIAGNOSIS — Z8551 Personal history of malignant neoplasm of bladder: Secondary | ICD-10-CM | POA: Diagnosis not present

## 2022-11-11 DIAGNOSIS — I480 Paroxysmal atrial fibrillation: Secondary | ICD-10-CM | POA: Diagnosis not present

## 2022-11-11 DIAGNOSIS — I10 Essential (primary) hypertension: Secondary | ICD-10-CM

## 2022-11-11 DIAGNOSIS — Z951 Presence of aortocoronary bypass graft: Secondary | ICD-10-CM | POA: Diagnosis not present

## 2022-11-11 DIAGNOSIS — Z9981 Dependence on supplemental oxygen: Secondary | ICD-10-CM | POA: Diagnosis not present

## 2022-11-11 DIAGNOSIS — Z79899 Other long term (current) drug therapy: Secondary | ICD-10-CM | POA: Diagnosis not present

## 2022-11-11 DIAGNOSIS — Z95810 Presence of automatic (implantable) cardiac defibrillator: Secondary | ICD-10-CM | POA: Diagnosis not present

## 2022-11-11 DIAGNOSIS — Z87891 Personal history of nicotine dependence: Secondary | ICD-10-CM | POA: Diagnosis not present

## 2022-11-11 DIAGNOSIS — Z8719 Personal history of other diseases of the digestive system: Secondary | ICD-10-CM | POA: Diagnosis not present

## 2022-11-11 DIAGNOSIS — I2510 Atherosclerotic heart disease of native coronary artery without angina pectoris: Secondary | ICD-10-CM | POA: Diagnosis not present

## 2022-11-11 DIAGNOSIS — N189 Chronic kidney disease, unspecified: Secondary | ICD-10-CM | POA: Insufficient documentation

## 2022-11-11 DIAGNOSIS — J069 Acute upper respiratory infection, unspecified: Secondary | ICD-10-CM | POA: Diagnosis not present

## 2022-11-11 DIAGNOSIS — E1122 Type 2 diabetes mellitus with diabetic chronic kidney disease: Secondary | ICD-10-CM | POA: Insufficient documentation

## 2022-11-11 DIAGNOSIS — Z9861 Coronary angioplasty status: Secondary | ICD-10-CM

## 2022-11-11 DIAGNOSIS — N184 Chronic kidney disease, stage 4 (severe): Secondary | ICD-10-CM

## 2022-11-11 DIAGNOSIS — Z7901 Long term (current) use of anticoagulants: Secondary | ICD-10-CM | POA: Insufficient documentation

## 2022-11-11 MED ORDER — MAGNESIUM 200 MG PO TABS: 200.0000 mg | ORAL_TABLET | Freq: Two times a day (BID) | ORAL | 5 refills | Status: DC

## 2022-11-11 NOTE — Progress Notes (Signed)
ReDS Vest / Clip - 11/11/22 1037       ReDS Vest / Clip   Station Marker C    Ruler Value 28    ReDS Actual Value 47

## 2022-11-11 NOTE — Patient Instructions (Addendum)
Continue weighing daily and call for an overnight weight gain of 3 pounds or more or a weekly weight gain of more than 5 pounds.   If you have voicemail, please make sure your mailbox is cleaned out so that we may leave a message and please make sure to listen to any voicemails.    Start taking magnesium '200mg'$  twice a day for now.    Decrease your bactrim to once daily due to your kidney function.    Come to pre-admit testing on Thursday at 8am to get your lab work drawn.

## 2022-11-11 NOTE — Progress Notes (Signed)
Patient ID: Taylor [name], male   DOB: Dec 11, 1940, 81 y.o.   MRN: [id number]  [place] - PHARMACIST COUNSELING NOTE  *HFrEF < 20%*  Guideline-Directed Medical Therapy/Evidence Based Medicine  ACE/ARB/ARNI:  none - soft BP and AKI Beta Blocker: Carvedilol 12.5 mg twice daily Aldosterone Antagonist:  none  - soft BP and AKI Diuretic: Torsemide 60 mg daily SGLT2i:  none  Adherence Assessment  Do you ever forget to take your medication? '[]'$ Yes '[x]'$ No  Do you ever skip doses due to side effects? '[]'$ Yes '[x]'$ No  Do you have trouble affording your medicines? '[]'$ Yes '[x]'$ No  Are you ever unable to pick up your medication due to transportation difficulties? '[]'$ Yes '[x]'$ No  Do you ever stop taking your medications because you don't believe they are helping? '[]'$ Yes '[x]'$ No  Do you check your weight daily? '[x]'$ Yes '[]'$ No   Adherence strategy: pill box and list  Barriers to obtaining medications: none  Vital signs: HR 62, BP 91/74, weight (pounds) 179 lbs  ECHO: Date 10/30/22, EF <20%, notes mild LVH, severe LAE, severe MR, mild AR and moderate/severe TR  ReDS value 47     Latest Ref Rng & Units 11/08/2022   10:49 AM 11/02/2022    4:42 AM 11/01/2022    5:04 AM  BMP  Glucose 70 - 99 mg/dL 130  99  119   BUN 8 - 27 mg/dL 52  71  88   Creatinine 0.76 - 1.27 mg/dL 2.81  2.85  3.94   BUN/Creat Ratio 10 - 24 19     Sodium 134 - 144 mmol/L 142  143  139   Potassium 3.5 - 5.2 mmol/L 4.0  4.1  4.2   Chloride 96 - 106 mmol/L 97  111  108   CO2 20 - 29 mmol/L '26  24  24   '$ Calcium 8.6 - 10.2 mg/dL 9.4  9.0  8.5     Past Medical History:  Diagnosis Date   AAA (abdominal aortic aneurysm) ([place]) 06/03/2007   [facility]; Dr. Kellie Simmering   AICD (automatic cardioverter/defibrillator) present    Arrhythmia    atrial fibrillation   Barrett's esophagus    Bladder cancer ([place])    Bradycardia    CAD (coronary artery disease)    CAP (community  acquired pneumonia) 11/13/2019   CHF (congestive heart failure) ([place])    Cluster headache    COVID-19 09/27/2021   DDD (degenerative disc disease), lumbar    Diabetes mellitus without complication ([place])    Dyspnea    WITH EXERTION   Edema    LEFT ANKLE   Fracture of skull base ([place]) 1997   due to fall   GERD (gastroesophageal reflux disease)    Gout    History of bladder cancer 12/1995   Hyperlipidemia    Hypertension    Hypocalcemia 04/19/2020   Possibly secondary to diuretics.   Low=5.9 04/19/2020   Malignant melanoma ([place]) 12/2012   right dorsal forearm excised   Myocardial infarction [facility])    LAST 2014   Osteoarthritis of knee    Other specified complication of vascular prosthetic devices, implants and grafts, initial encounter ([place]) 08/22/2021   Pacemaker 10/10/2006   Pancreatitis 11/22/2019   Pneumonia    2016   Pre-diabetes    Psoriasis    Rib fracture 1997   due to fall   Sleep apnea    CPAP   Stroke (HCC)    Venous incompetence  ASSESSMENT 81 year old male who presents to the HF clinic for follow up. PMH includes history of AAA, bladder cancer, DM, atrial fibrillation, CAD, hyperlipidemia, HTN, stroke, GERD, melanoma, pancreatitis, sleep apnea, VF and chronic heart failure. Last ED visit was 12/6 for dyspnea.  Noted recent blood work with Mg on 1.4 and no replacement ordered. Also on Bactrim DS BID with reduced renal function. Medication reconciliation completed during appointment and medication questions addressed.    PLAN  Add MgOx '200mg'$  BID and repeat Mg level in 3-5 days (hx of VT) Decrease Bactrim DS to daily instead of BID Fluid overloaded per ReDS of 47%, NP managing fluid status [place] BMET  Time spent: 20 minutes  Luci Bellucci Rodriguez-Guzman PharmD, BCPS 11/11/2022 2:25 PM    Current Outpatient Medications:    allopurinol (ZYLOPRIM) 300 MG tablet, Take 0.5 tablets (150 mg total) by mouth daily., Disp: , Rfl:    apixaban (ELIQUIS) 2.5 MG  TABS tablet, Take 2 tablets (5 mg total) by mouth 2 (two) times daily., Disp: 360 tablet, Rfl: 0   aspirin 81 MG EC tablet, Take 81 mg by mouth daily. , Disp: , Rfl:    atorvastatin (LIPITOR) 40 MG tablet, TAKE 1 TABLET(40 MG) BY MOUTH DAILY, Disp: 90 tablet, Rfl: 1   Calcium Carbonate Antacid 600 MG chewable tablet, Chew 1 tablet by mouth daily., Disp: , Rfl:    carvedilol (COREG) 12.5 MG tablet, Take 12.5 mg by mouth 2 (two) times daily with a meal., Disp: , Rfl:    glucose blood (ONETOUCH VERIO) test strip, Use as instructed to check sugar daily, Disp: 100 each, Rfl: 12   isosorbide mononitrate (IMDUR) 30 MG 24 hr tablet, Take 0.5 tablets (15 mg total) by mouth daily., Disp: 15 tablet, Rfl: 0   Magnesium 200 MG TABS, Take 1 tablet (200 mg total) by mouth in the morning and at bedtime., Disp: 60 tablet, Rfl: 5   pantoprazole (PROTONIX) 40 MG tablet, TAKE 1 TABLET BY MOUTH EVERY DAY, Disp: 90 tablet, Rfl: 4   sulfamethoxazole-trimethoprim (BACTRIM DS) 800-160 MG tablet, Take 1 tablet by mouth 2 (two) times daily for 7 days., Disp: 14 tablet, Rfl: 0   torsemide (DEMADEX) 20 MG tablet, Take 60 mg by mouth daily., Disp: , Rfl:    triamcinolone ointment (KENALOG) 0.1 %, APPLY TOPICALLY TWICE DAILY AS DIRECTED, Disp: 454 g, Rfl: 3   MEDICATION ADHERENCES TIPS AND STRATEGIES Taking medication as prescribed improves patient outcomes in heart failure (reduces hospitalizations, improves symptoms, increases survival) Side effects of medications can be managed by decreasing doses, switching agents, stopping drugs, or adding additional therapy. Please let someone in the [facility] know if you have having bothersome side effects so we can modify your regimen. Do not alter your medication regimen without talking to [place].  Medication reminders can help patients remember to take drugs on time. If you are missing or forgetting doses you can try linking behaviors, using pill boxes, or an electronic reminder  like an alarm on your phone or an app. Some people can also get automated phone calls as medication reminders.

## 2022-11-12 ENCOUNTER — Ambulatory Visit: Payer: Self-pay | Admitting: *Deleted

## 2022-11-12 NOTE — Patient Outreach (Signed)
  Care Coordination   Follow Up Visit Note   11/12/2022 Name: Taylor [name] MRN: [id number] DOB: 03-06-1941  Taylor [name] is a 81 y.o. year old male who sees [name], Taylor Peri, MD for primary care. I spoke with  Taylor [name] by phone today.  What matters to the patients health and wellness today?  Hospitalized 12/6-12/9 for CHF. Continues with cough and congestion today but slowly improving.     Goals Addressed             This Visit's Progress    Develop Plan of care for Management of CHF       Care Coordination Interventions: Basic overview and discussion of pathophysiology of Heart Failure reviewed Provided education on low sodium diet Reviewed Heart Failure Action Plan in depth and provided written copy Assessed need for readable accurate scales in home Advised patient to weigh each morning after emptying bladder Discussed importance of daily weight and advised patient to weigh and record daily Reviewed role of diuretics in prevention of fluid overload and management of heart failure; Screening for signs and symptoms of depression related to chronic disease state  Assessed social determinant of health barriers  Discussed deep breathing with use of oxygen.  Saturations earlier today was 79%, now up to 98%.  State there was an issue with turning the oxygen on this morning, encouraged to call agency to report issues and troubleshooting.  Follow up appointments completed with PCP on 12/15 and HF clinic on 12/18, will see cardiology on 12/28 Weight today and yesterday was 173 pounds Discussed use of medications, currently still taking antibiotics         SDOH assessments and interventions completed:  No     Care Coordination Interventions:  Yes, provided   Follow up plan: Follow up call scheduled for 12/29    Encounter Outcome:  Pt. Visit Completed   Taylor David, RN, MSN, [place] Care Management Care Management Coordinator [phone]

## 2022-11-12 NOTE — Patient Instructions (Addendum)
Visit Information  Thank you for taking time to visit with me today. Please don't hesitate to contact me if I can be of assistance to you before our next scheduled telephone appointment.  Following are the goals we discussed today:  Weigh daily and record readings.  Notify HF clinic if weight increased more than 3 pounds overnight and more than 5 pounds in a week.  Our next appointment is by telephone on 12/29  Please call the care guide team at [phone] if you need to cancel or reschedule your appointment.   Please call the Suicide and Crisis Lifeline: 988 call the [place] National Suicide Prevention Lifeline: 308-726-9084 or TTY: (480)323-4659 TTY (657)412-2495) to talk to a trained counselor call 1-800-273-TALK (toll free, 24 hour hotline) call 911 if you are experiencing a Mental Health or [place] or need someone to talk to.  Patient verbalizes understanding of instructions and care plan provided today and agrees to view in [place]. Active MyChart status and patient understanding of how to access instructions and care plan via MyChart confirmed with patient.     The patient has been provided with contact information for the care management team and has been advised to call with any health related questions or concerns.   Valente David, RN, MSN, [address] Care Management Care Management Coordinator [phone]

## 2022-11-13 ENCOUNTER — Telehealth: Payer: Self-pay

## 2022-11-13 NOTE — Progress Notes (Signed)
    Chronic Care Management Pharmacy Assistant   Name: Taylor [name]  MRN: [id number] DOB: Apr 04, 1941  Reason for Encounter:Heart Failure Disease State Call.   Recent office visits:  11/08/2022 Dr. [name] Section MD (PCP) start Bactrim 800-160 mg 1 tablet twice daily for 7 days  Recent consult visits:  11/11/2022 Darylene Price FNP (Cardiology) start taking magnesium '200mg'$  twice a day,  Decrease your bactrim to once daily due to your kidney function   Hospital visits:  None in previous 6 months  Medications: Outpatient Encounter Medications as of 11/13/2022  Medication Sig Note   allopurinol (ZYLOPRIM) 300 MG tablet Take 0.5 tablets (150 mg total) by mouth daily.    apixaban (ELIQUIS) 2.5 MG TABS tablet Take 2 tablets (5 mg total) by mouth 2 (two) times daily.    aspirin 81 MG EC tablet Take 81 mg by mouth daily.     atorvastatin (LIPITOR) 40 MG tablet TAKE 1 TABLET(40 MG) BY MOUTH DAILY    Calcium Carbonate Antacid 600 MG chewable tablet Chew 1 tablet by mouth daily.    carvedilol (COREG) 12.5 MG tablet Take 12.5 mg by mouth 2 (two) times daily with a meal.    glucose blood (ONETOUCH VERIO) test strip Use as instructed to check sugar daily    isosorbide mononitrate (IMDUR) 30 MG 24 hr tablet Take 0.5 tablets (15 mg total) by mouth daily.    Magnesium 200 MG TABS Take 1 tablet (200 mg total) by mouth in the morning and at bedtime.    pantoprazole (PROTONIX) 40 MG tablet TAKE 1 TABLET BY MOUTH EVERY DAY    sulfamethoxazole-trimethoprim (BACTRIM DS) 800-160 MG tablet Take 1 tablet by mouth 2 (two) times daily for 7 days.    torsemide (DEMADEX) 20 MG tablet Take 60 mg by mouth daily. 01/23/2021: Prescribed by Dr. Nehemiah Massed    triamcinolone ointment (KENALOG) 0.1 % APPLY TOPICALLY TWICE DAILY AS DIRECTED    No facility-administered encounter medications on file as of 11/13/2022.    Care Gaps: COVID-19 Vaccine Shingrix Vaccine Datap Vaccine   Star Rating Drugs: Atorvastatin 40 mg last filled  10/08/2022 90 day supply at [facility] Losartan 50 mg last filled 09/16/2022 90 day supply at [facility].  Heart Failure Treatment: Carvedilol 12.5 mg twice daily  Imdur 30 mg 1/2 tablet daily  Torsemide 60 mg daily   Do you monitor your weight every day?  Patient reports he weighs his self daily.  Have you noticed any changes in your weight or fluid status ( Greater than 2 pounds in one day; 5 pounds in one week) ?  Patient denies gaining any weight at this time.  How often are you taking your fluid pill (Furosemide, Torsemide, or Bumetanide) ?  Patient reports he takes Torsemide 60 mg daily.  Patient states he completed a xray today as he is still not feeling great.Patient reports he is waiting on the results.   [place] Pharmacist Assistant [phone]

## 2022-11-14 ENCOUNTER — Other Ambulatory Visit: Payer: Self-pay | Admitting: Family

## 2022-11-14 ENCOUNTER — Encounter
Admission: RE | Admit: 2022-11-14 | Discharge: 2022-11-14 | Disposition: A | Discharge status: Home / Self Care | Payer: Medicare Other | Source: Ambulatory Visit | Attending: Family | Admitting: Family

## 2022-11-14 ENCOUNTER — Telehealth: Payer: Self-pay | Admitting: Family Medicine

## 2022-11-14 ENCOUNTER — Telehealth: Payer: Self-pay | Admitting: Family

## 2022-11-14 DIAGNOSIS — R052 Subacute cough: Secondary | ICD-10-CM

## 2022-11-14 DIAGNOSIS — I5020 Unspecified systolic (congestive) heart failure: Secondary | ICD-10-CM | POA: Insufficient documentation

## 2022-11-14 DIAGNOSIS — Z01812 Encounter for preprocedural laboratory examination: Secondary | ICD-10-CM | POA: Diagnosis not present

## 2022-11-14 LAB — BASIC METABOLIC PANEL
Anion gap: 12 (ref 5–15)
BUN: 75 mg/dL — ABNORMAL HIGH (ref 8–23)
CO2: 27 mmol/L (ref 22–32)
Calcium: 9 mg/dL (ref 8.9–10.3)
Chloride: 97 mmol/L — ABNORMAL LOW (ref 98–111)
Creatinine, Ser: 4.21 mg/dL — ABNORMAL HIGH (ref 0.61–1.24)
GFR, Estimated: 14 mL/min — ABNORMAL LOW (ref >60)
Glucose, Bld: 98 mg/dL (ref 70–99)
Potassium: 4.2 mmol/L (ref 3.5–5.1)
Sodium: 136 mmol/L (ref 135–145)

## 2022-11-14 LAB — MAGNESIUM: Magnesium: 2.1 mg/dL (ref 1.7–2.4)

## 2022-11-14 NOTE — Telephone Encounter (Signed)
Please check with patient about his cold and sinus symptoms he was started on antibiotic last week but was stopped by the cardiologist due to worsening kidney functions. If he is feeling better then no additional antibiotic is needed, otherwise we will start him on something different.

## 2022-11-14 NOTE — Telephone Encounter (Signed)
Patient was advised. Patient states he feels somewhat improved. However, he is not able to clear his lungs. He says when he cough it feels like mucous is there but does not come out. Patient wanted to know if he can do another chest x-ray? Please advise?

## 2022-11-14 NOTE — Telephone Encounter (Signed)
That's fine have placed order.

## 2022-11-14 NOTE — Telephone Encounter (Signed)
Spoke with patient regarding BMP/ Mg results obtained earlier today. Mg is now 2.1 (was 1.4) so he was advised to continue taking magnesium BID and will recheck next week.   Renal function has worsened since he's been taking bactrim. Advised him to stop taking bactrim completely. Reached out to his PCP and advised of worsening renal function and that bactrim would be stopped. PCP says that his office will reach out to patient with new plan. Will recheck BMP next week as well.   Patient verbalized understanding.

## 2022-11-15 ENCOUNTER — Ambulatory Visit
Admission: RE | Admit: 2022-11-15 | Discharge: 2022-11-15 | Disposition: A | Discharge status: Home / Self Care | Payer: Medicare Other | Attending: Family Medicine | Admitting: Family Medicine

## 2022-11-15 ENCOUNTER — Ambulatory Visit
Admission: RE | Admit: 2022-11-15 | Discharge: 2022-11-15 | Disposition: A | Discharge status: Home / Self Care | Payer: Medicare Other | Source: Ambulatory Visit | Attending: Family Medicine | Admitting: Family Medicine

## 2022-11-15 DIAGNOSIS — R052 Subacute cough: Secondary | ICD-10-CM

## 2022-11-15 DIAGNOSIS — R059 Cough, unspecified: Secondary | ICD-10-CM | POA: Diagnosis not present

## 2022-11-15 DIAGNOSIS — R0602 Shortness of breath: Secondary | ICD-10-CM | POA: Diagnosis not present

## 2022-11-15 NOTE — Telephone Encounter (Signed)
Patient was advised.  

## 2022-11-19 ENCOUNTER — Encounter: Payer: Self-pay | Admitting: *Deleted

## 2022-11-20 ENCOUNTER — Encounter
Admission: RE | Admit: 2022-11-20 | Discharge: 2022-11-20 | Disposition: A | Discharge status: Home / Self Care | Payer: Medicare Other | Source: Ambulatory Visit | Attending: Family | Admitting: Family

## 2022-11-20 ENCOUNTER — Telehealth: Payer: Self-pay | Admitting: Family

## 2022-11-20 DIAGNOSIS — I5020 Unspecified systolic (congestive) heart failure: Secondary | ICD-10-CM | POA: Diagnosis not present

## 2022-11-20 DIAGNOSIS — E1122 Type 2 diabetes mellitus with diabetic chronic kidney disease: Secondary | ICD-10-CM

## 2022-11-20 DIAGNOSIS — Z01812 Encounter for preprocedural laboratory examination: Secondary | ICD-10-CM | POA: Diagnosis not present

## 2022-11-20 LAB — BASIC METABOLIC PANEL
Anion gap: 8 (ref 5–15)
BUN: 65 mg/dL — ABNORMAL HIGH (ref 8–23)
CO2: 28 mmol/L (ref 22–32)
Calcium: 9 mg/dL (ref 8.9–10.3)
Chloride: 105 mmol/L (ref 98–111)
Creatinine, Ser: 4.25 mg/dL — ABNORMAL HIGH (ref 0.61–1.24)
GFR, Estimated: 13 mL/min — ABNORMAL LOW (ref >60)
Glucose, Bld: 126 mg/dL — ABNORMAL HIGH (ref 70–99)
Potassium: 4 mmol/L (ref 3.5–5.1)
Sodium: 141 mmol/L (ref 135–145)

## 2022-11-20 LAB — MAGNESIUM: Magnesium: 2.3 mg/dL (ref 1.7–2.4)

## 2022-11-20 NOTE — Telephone Encounter (Signed)
Spoke with patient's wife regarding BMP/ Mg results obtained earlier today. Continue Mg BID. Renal function has not improved so will make nephrology referral and patient's wife was in agreement with this plan.

## 2022-11-20 NOTE — Telephone Encounter (Signed)
Faxed referral to [facility] [place] [facility] today for patient.   Jissel Slavens, NT

## 2022-11-21 ENCOUNTER — Other Ambulatory Visit: Payer: Self-pay | Admitting: Family Medicine

## 2022-11-21 DIAGNOSIS — I25810 Atherosclerosis of coronary artery bypass graft(s) without angina pectoris: Secondary | ICD-10-CM | POA: Diagnosis not present

## 2022-11-21 DIAGNOSIS — I5022 Chronic systolic (congestive) heart failure: Secondary | ICD-10-CM | POA: Diagnosis not present

## 2022-11-21 DIAGNOSIS — I480 Paroxysmal atrial fibrillation: Secondary | ICD-10-CM | POA: Diagnosis not present

## 2022-11-21 DIAGNOSIS — R001 Bradycardia, unspecified: Secondary | ICD-10-CM | POA: Diagnosis not present

## 2022-11-21 DIAGNOSIS — E782 Mixed hyperlipidemia: Secondary | ICD-10-CM | POA: Diagnosis not present

## 2022-11-21 DIAGNOSIS — I6523 Occlusion and stenosis of bilateral carotid arteries: Secondary | ICD-10-CM | POA: Diagnosis not present

## 2022-11-21 DIAGNOSIS — Z8679 Personal history of other diseases of the circulatory system: Secondary | ICD-10-CM | POA: Diagnosis not present

## 2022-11-21 DIAGNOSIS — I10 Essential (primary) hypertension: Secondary | ICD-10-CM | POA: Diagnosis not present

## 2022-11-21 DIAGNOSIS — Z9889 Other specified postprocedural states: Secondary | ICD-10-CM | POA: Diagnosis not present

## 2022-11-21 DIAGNOSIS — Z95810 Presence of automatic (implantable) cardiac defibrillator: Secondary | ICD-10-CM | POA: Diagnosis not present

## 2022-11-21 DIAGNOSIS — I5023 Acute on chronic systolic (congestive) heart failure: Secondary | ICD-10-CM | POA: Diagnosis not present

## 2022-11-21 DIAGNOSIS — I495 Sick sinus syndrome: Secondary | ICD-10-CM | POA: Diagnosis not present

## 2022-11-22 ENCOUNTER — Ambulatory Visit: Payer: Self-pay | Admitting: *Deleted

## 2022-11-22 NOTE — Patient Outreach (Signed)
  Care Coordination   Follow Up Visit Note   11/22/2022 Name: Taylor [name] MRN: [id number] DOB: [birth year]  Taylor [name] is a 81 y.o. year old male who sees [name], Taylor Peri, MD for primary care. I spoke with  Taylor [name] by phone today.  What matters to the patients health and wellness today?  Still has cough, flu/pneumonia/covid/RSV all negative.  Denies shortness of breath, denies a productive cough.     Goals Addressed             This Visit's Progress    Develop Plan of care for Management of CHF   On track    Care Coordination Interventions: Basic overview and discussion of pathophysiology of Heart Failure reviewed Provided education on low sodium diet Reviewed Heart Failure Action Plan in depth and provided written copy Assessed need for readable accurate scales in home Advised patient to weigh each morning after emptying bladder Discussed importance of daily weight and advised patient to weigh and record daily Reviewed role of diuretics in prevention of fluid overload and management of heart failure; Discussed deep breathing with use of oxygen.   Follow up appointments completed with HF on 1/3, pharmacy team on 1/9, PCP on 1/30, and nephrology in Feb Weight range reported is "same." Denies weight gain or swelling Discussed use of medications, stopped taking antibiotics due to kidney function but continues to have congestion and cough Blood pressure range 100-130s/60-70s         SDOH assessments and interventions completed:  No     Care Coordination Interventions:  Yes, provided   Follow up plan: Follow up call scheduled for 1/31    Encounter Outcome:  Pt. Visit Completed   Valente David, RN, MSN, [place] Care Management Care Management Coordinator [phone]

## 2022-11-22 NOTE — Patient Instructions (Signed)
Visit Information  Thank you for taking time to visit with me today. Please don't hesitate to contact me if I can be of assistance to you before our next scheduled telephone appointment.  Following are the goals we discussed today:  Call PCP office if cough does not get better or if you develop fever/shortness of breath.  Continue daily weights and blood pressure.   Our next appointment is by telephone on 1/31  Please call the care guide team at [phone] if you need to cancel or reschedule your appointment.   Please call the Suicide and Crisis Lifeline: 988 call the [place] National Suicide Prevention Lifeline: 8142165913 or TTY: (985)781-0241 TTY [phone]) to talk to a trained counselor call 1-800-273-TALK (toll free, 24 hour hotline) call 911 if you are experiencing a Mental Health or [place] or need someone to talk to.  Patient verbalizes understanding of instructions and care plan provided today and agrees to view in [place]. Active MyChart status and patient understanding of how to access instructions and care plan via MyChart confirmed with patient.     The patient has been provided with contact information for the care management team and has been advised to call with any health related questions or concerns.   Valente David, RN, MSN, [place] Care Management Care Management Coordinator [phone]

## 2022-11-27 ENCOUNTER — Encounter: Payer: Self-pay | Admitting: Family

## 2022-11-27 ENCOUNTER — Other Ambulatory Visit
Admission: RE | Admit: 2022-11-27 | Discharge: 2022-11-27 | Disposition: A | Discharge status: Home / Self Care | Payer: Medicare Other | Source: Ambulatory Visit | Attending: Family | Admitting: Family

## 2022-11-27 ENCOUNTER — Ambulatory Visit (HOSPITAL_BASED_OUTPATIENT_CLINIC_OR_DEPARTMENT_OTHER): Payer: Medicare Other | Admitting: Family

## 2022-11-27 VITALS — BP 142/90 | HR 60 | Resp 18 | Wt 190.0 lb

## 2022-11-27 DIAGNOSIS — N184 Chronic kidney disease, stage 4 (severe): Secondary | ICD-10-CM

## 2022-11-27 DIAGNOSIS — I10 Essential (primary) hypertension: Secondary | ICD-10-CM

## 2022-11-27 DIAGNOSIS — I2510 Atherosclerotic heart disease of native coronary artery without angina pectoris: Secondary | ICD-10-CM

## 2022-11-27 DIAGNOSIS — I5020 Unspecified systolic (congestive) heart failure: Secondary | ICD-10-CM

## 2022-11-27 DIAGNOSIS — E1122 Type 2 diabetes mellitus with diabetic chronic kidney disease: Secondary | ICD-10-CM | POA: Diagnosis not present

## 2022-11-27 DIAGNOSIS — Z9861 Coronary angioplasty status: Secondary | ICD-10-CM | POA: Diagnosis not present

## 2022-11-27 DIAGNOSIS — I480 Paroxysmal atrial fibrillation: Secondary | ICD-10-CM

## 2022-11-27 LAB — BASIC METABOLIC PANEL
Anion gap: 9 (ref 5–15)
BUN: 54 mg/dL — ABNORMAL HIGH (ref 8–23)
CO2: 26 mmol/L (ref 22–32)
Calcium: 8.9 mg/dL (ref 8.9–10.3)
Chloride: 103 mmol/L (ref 98–111)
Creatinine, Ser: 2.77 mg/dL — ABNORMAL HIGH (ref 0.61–1.24)
GFR, Estimated: 22 mL/min — ABNORMAL LOW (ref >60)
Glucose, Bld: 130 mg/dL — ABNORMAL HIGH (ref 70–99)
Potassium: 3.6 mmol/L (ref 3.5–5.1)
Sodium: 138 mmol/L (ref 135–145)

## 2022-11-27 NOTE — Progress Notes (Signed)
Patient ID: Taylor [name], male    DOB: 1941-01-23, 82 y.o.   MRN: [id number]  HPI  Taylor [name] is a 82 y/o male with a history of AAA, bladder cancer, DM, atrial fibrillation, CAD, hyperlipidemia, HTN, stroke, GERD, melanoma, pancreatitis, sleep apnea, VF and chronic heart failure.   Echo report from 10/30/22 showed an EF of <20% along with mild LVH, severe LAE, severe Taylor, mild AR and moderate/severe TR.   Admitted 10/30/22 due to DOE and orthopnea x 7 days. Chest xray showed cardiomegaly and central vascular congestion. Cardiology & nephrology consults obtained. Given IV lasix. Cr worsened with diuretics so discontinued and put on gentle fluids in setting of HF. Placed on oxygen. Discharged after 3 days. Was in the ED 10/27/22 due to nausea epigastric discomfort some shortness of breath;   He presents today for a follow-up visit with a chief complaint of moderate shortness of breath with minimal exertion. Describes this as chronic in nature. Has associated fatigue, cough, head congestion, runny nose and light-headedness along with this. Denies any difficulty sleeping, abdominal distention, palpitations, pedal edema, chest pain or wheezing.   Weight at home ranges from 174-180 pounds. BP is fluctuating at home from 104/63- 150/105.  Past Medical History:  Diagnosis Date   AAA (abdominal aortic aneurysm) ([place]) 06/03/2007   [facility]; Dr. Kellie Simmering   AICD (automatic cardioverter/defibrillator) present    Arrhythmia    atrial fibrillation   Barrett's esophagus    Bladder cancer ([place])    Bradycardia    CAD (coronary artery disease)    CAP (community acquired pneumonia) 11/13/2019   CHF (congestive heart failure) ([place])    Cluster headache    COVID-19 09/27/2021   DDD (degenerative disc disease), lumbar    Diabetes mellitus without complication ([place])    Dyspnea    WITH EXERTION   Edema    LEFT ANKLE   Fracture of skull base ([place]) 1997   due to fall   GERD (gastroesophageal  reflux disease)    Gout    History of bladder cancer 12/1995   Hyperlipidemia    Hypertension    Hypocalcemia 04/19/2020   Possibly secondary to diuretics.   Low=5.9 04/19/2020   Malignant melanoma ([place]) 12/2012   right dorsal forearm excised   Myocardial infarction [facility])    LAST 2014   Osteoarthritis of knee    Other specified complication of vascular prosthetic devices, implants and grafts, initial encounter ([place]) 08/22/2021   Pacemaker 10/10/2006   Pancreatitis 11/22/2019   Pneumonia    2016   Pre-diabetes    Psoriasis    Rib fracture 1997   due to fall   Sleep apnea    CPAP   Stroke [facility])    Venous incompetence    Past Surgical History:  Procedure Laterality Date   ABDOMINAL AORTIC ANEURYSM REPAIR  06/03/2007   [facility]; Dr. Kellie Simmering   ANGIOPLASTY  1994   [place]   BLADDER TUMOR EXCISION  12/1995   CAROTID PTA/STENT INTERVENTION Left 07/23/2022   Procedure: CAROTID PTA/STENT INTERVENTION;  Surgeon: Katha Cabal, MD;  Location: [place] CV LAB;  Service: Cardiovascular;  Laterality: Left;   CATARACT EXTRACTION W/PHACO Left 10/22/2016   Procedure: CATARACT EXTRACTION PHACO AND INTRAOCULAR LENS PLACEMENT (IOC);  Surgeon: Birder Robson, MD;  Location: [facility] ORS;  Service: Ophthalmology;  Laterality: Left;  [place] 47.7 AP% 18.4 CDE 8.78 Fluid pack lot # [id number]   CATARACT EXTRACTION W/PHACO Right 12/10/2016  Procedure: CATARACT EXTRACTION PHACO AND INTRAOCULAR LENS PLACEMENT (IOC);  Surgeon: Birder Robson, MD;  Location: [facility] ORS;  Service: Ophthalmology;  Laterality: Right;  [place] 00:39 AP% 23.3 CDE 9.13 Fluid pack lot # [id number]   CORONARY ANGIOPLASTY     STENTS X 5   CORONARY ARTERY BYPASS GRAFT  09/22/2006   four   ELBOW BURSA SURGERY     DUE TO GOUT   INSERT / REPLACE / REMOVE PACEMAKER     MELANOMA EXCISION  12/2012   Right forearm   Family History  Problem Relation Age of Onset   Cancer Mother        Melanoma skin cancer   Heart  attack Father 44   Cancer Father        throat cancer   Arthritis Brother    Social History   Tobacco Use   Smoking status: Former    Packs/day: 1.00    Types: Cigarettes    Quit date: 11/26/1999    Years since quitting: 23.0   Smokeless tobacco: Never  Substance Use Topics   Alcohol use: No   Allergies  Allergen Reactions   Amlodipine Besylate Swelling    Had a reaction when taking with colcrys    Crestor [Rosuvastatin]     Muscle cramps and pain   Rocephin [Ceftriaxone]     unknown   Prior to Admission medications   Medication Sig Start Date End Date Taking? Authorizing Provider  allopurinol (ZYLOPRIM) 300 MG tablet Take 0.5 tablets (150 mg total) by mouth daily. 09/25/22  Yes Birdie Sons, MD  apixaban (ELIQUIS) 2.5 MG TABS tablet Take 2 tablets (5 mg total) by mouth 2 (two) times daily. 11/02/22 01/31/23 Yes Richarda Osmond, MD  aspirin 81 MG EC tablet Take 81 mg by mouth daily.    Yes [provider]  atorvastatin (LIPITOR) 40 MG tablet TAKE 1 TABLET(40 MG) BY MOUTH DAILY 05/22/22  Yes Birdie Sons, MD  Calcium Carbonate Antacid 600 MG chewable tablet Chew 1 tablet by mouth daily.   Yes [provider]  carvedilol (COREG) 12.5 MG tablet Take 12.5 mg by mouth 2 (two) times daily with a meal.   Yes [provider]  glucose blood (ONETOUCH VERIO) test strip Use as instructed to check sugar daily 12/14/19  Yes Birdie Sons, MD  isosorbide mononitrate (IMDUR) 30 MG 24 hr tablet Take 0.5 tablets (15 mg total) by mouth daily. 11/03/22 12/03/22 Yes Richarda Osmond, MD  Magnesium 200 MG TABS Take 1 tablet (200 mg total) by mouth in the morning and at bedtime. 11/11/22  Yes Josefita Weissmann, Otila Kluver A, FNP  torsemide (DEMADEX) 20 MG tablet Take 60 mg by mouth daily. 09/04/20  Yes [provider]  triamcinolone ointment (KENALOG) 0.1 % APPLY TOPICALLY TWICE DAILY AS DIRECTED 07/15/22  Yes Birdie Sons, MD  pantoprazole (PROTONIX) 40 MG tablet TAKE  1 TABLET BY MOUTH EVERY DAY Patient not taking: Reported on 11/27/2022 11/21/22   Birdie Sons, MD   Review of Systems  Constitutional:  Positive for fatigue. Negative for appetite change.  HENT:  Positive for congestion, hearing loss and rhinorrhea. Negative for sore throat.   Eyes: Negative.   Respiratory:  Positive for cough and shortness of breath (easily). Negative for chest tightness and wheezing.   Cardiovascular:  Negative for chest pain, palpitations and leg swelling.  Gastrointestinal:  Negative for abdominal distention and abdominal pain.  Endocrine: Negative.   Genitourinary: Negative.   Musculoskeletal:  Negative for back pain and neck pain.  Skin: Negative.   Allergic/Immunologic: Negative.   Neurological:  Positive for light-headedness. Negative for dizziness.  Hematological:  Negative for adenopathy. Bruises/bleeds easily.  Psychiatric/Behavioral:  Negative for dysphoric mood and sleep disturbance (sleeping on 1 pillow; wearing oxygen at 5L). The patient is not nervous/anxious.    Vitals:   11/27/22 1455 11/27/22 1507  BP: (!) 121/100 (!) 142/90  Pulse: (!) 50 60  Resp: 18   SpO2: 92%   Weight: 190 lb (86.2 kg)    Wt Readings from Last 3 Encounters:  11/27/22 190 lb (86.2 kg)  11/11/22 179 lb 2 oz (81.3 kg)  11/08/22 177 lb (80.3 kg)   Lab Results  Component Value Date   CREATININE 2.77 (H) 11/27/2022   CREATININE 4.25 (H) 11/20/2022   CREATININE 4.21 (H) 11/14/2022   Physical Exam Vitals and nursing note reviewed.  Constitutional:      Appearance: Normal appearance.  HENT:     Head: Normocephalic and atraumatic.     Right Ear: Decreased hearing noted.     Left Ear: Decreased hearing noted.  Cardiovascular:     Rate and Rhythm: Regular rhythm. Bradycardia present.  Pulmonary:     Effort: Pulmonary effort is normal.     Breath sounds: No wheezing, rhonchi or rales.  Abdominal:     Palpations: Abdomen is soft.     Tenderness: There is no abdominal  tenderness.  Musculoskeletal:     Cervical back: Normal range of motion and neck supple.     Right lower leg: No tenderness. No edema.     Left lower leg: No tenderness. No edema.  Skin:    General: Skin is warm and dry.  Neurological:     General: No focal deficit present.     Mental Status: He is alert and oriented to person, place, and time.  Psychiatric:        Mood and Affect: Mood normal.        Behavior: Behavior normal.   Assessment & Plan:  1: Chronic heart failure with reduced ejection fraction- - NYHA class III - euvolemic  - weighing daily; weight chart reviewed and shows 174-180 pounds over the last couple of weeks; reminded to call for an overnight weight gain of > 2 pounds or a weekly weight gain of > 5 pounds - has noticed a gradual weight gain but says that he's been eating too much over the holidays - not adding salt to his food - on GDMT of carvedilol  - renal function limits spironolactone/ SGLT2/ entresto usage - has AICD; Mg 11/20/22 was 2.3 - BNP 10/30/22 was 554.5  2: HTN- - BP 142/90 - saw PCP Taylor [name]) 11/08/22 - BMP 11/20/22 reviewed and showed sodium 141, potassium 4.0, creatinine 4.25 and GFR 13 - nephrology appt is in early Feb - recheck BMP today  3: Atrial fibrillation- - saw cardiology Taylor [name]) 11/21/22 - on eliquis - has pacemaker with OptiVol capacity for fluid monitoring  4: CAD- - CABG 2007  5: DM with CKD-  - A1c 08/06/22 was 6.5%   Medication list reviewed.   Return in 2 months, sooner if needed

## 2022-11-27 NOTE — Patient Instructions (Signed)
Continue weighing daily and call for an overnight weight gain of 3 pounds or more or a weekly weight gain of more than 5 pounds.   If you have voicemail, please make sure your mailbox is cleaned out so that we may leave a message and please make sure to listen to any voicemails.     

## 2022-11-28 ENCOUNTER — Other Ambulatory Visit: Payer: Self-pay | Admitting: Student in an Organized Health Care Education/Training Program

## 2022-12-02 ENCOUNTER — Telehealth: Payer: Self-pay | Admitting: *Deleted

## 2022-12-02 NOTE — Patient Outreach (Signed)
  Care Coordination   Follow Up Visit Note   12/02/2022 Name: FELICE DEEM MRN: [id number] DOB: Dec 28, 1940  DEMOND SHALLENBERGER is a 82 y.o. year old male who sees [name], [name], MD for primary care. I spoke with  [name] by phone today.  What matters to the patients health and wellness today?  Patient called to [facility] office requesting call back.  Spoke with patient, clarified upcoming appointments (1/9 with pharmacist, 1/30 with PCP, and 2/19 with cardiology).  Denies any urgent concerns, encouraged to contact this care manager with questions.        SDOH assessments and interventions completed:  No     Care Coordination Interventions:  Yes, provided   Follow up plan: Follow up call scheduled for 1/31    Encounter Outcome:  Pt. Visit Completed   Valente David, RN, MSN, [place] Care Management Care Management Coordinator [phone]

## 2022-12-03 ENCOUNTER — Ambulatory Visit: Payer: Medicare Other

## 2022-12-03 ENCOUNTER — Other Ambulatory Visit: Payer: Self-pay | Admitting: Student in an Organized Health Care Education/Training Program

## 2022-12-03 DIAGNOSIS — I2510 Atherosclerotic heart disease of native coronary artery without angina pectoris: Secondary | ICD-10-CM

## 2022-12-03 DIAGNOSIS — I5020 Unspecified systolic (congestive) heart failure: Secondary | ICD-10-CM

## 2022-12-03 DIAGNOSIS — J9621 Acute and chronic respiratory failure with hypoxia: Secondary | ICD-10-CM | POA: Diagnosis not present

## 2022-12-03 NOTE — Progress Notes (Signed)
Care Management & Coordination Services Pharmacy Note  12/03/2022 Name:  Taylor [name] MRN:  [id number] DOB:  July 28, 1941  Summary: Patient presents for follow-up consult. Patient reports significant shortness of breath. Recent cardiology visits showed patient was euvolemic, but patient feels he is still above his dry weight. He is now on continuous O2. Patient is weighing daily.   Recommendations/Changes made from today's visit: Continue current medications   Follow up plan: CPP follow-up 1 month.    Subjective: Taylor [name] is an 82 y.o. year old male who is a primary patient of [name], [name], MD.  The care coordination team was consulted for assistance with disease management and care coordination needs.    Engaged with patient by telephone for follow up visit.  Patient Care Team: Birdie Sons, MD as PCP - General (Family Medicine) Corey Skains, MD as Consulting Physician (Cardiology) Birder Robson, MD as Referring Physician (Ophthalmology) Ottie Glazier, MD as Consulting Physician (Pulmonary Disease) Germaine Pomfret, [facility] (Pharmacist) Valente David, RN as [place] Management  Recent office visits:   Recent consult visits: 11/27/2022: Patient presented to  11/21/22: Patient presented to Leroy Libman, [place] (Cardiology)   Hospital visits: 12/6-12/9: Patient hospitalized for heart failure.  09/27/22: Patient presented to ED for sinusitis. Doxycycline.  07/23/22: Patient hospitalized for Carotid PTA/Stent.    Objective:  Lab Results  Component Value Date   CREATININE 2.77 (H) 11/27/2022   BUN 54 (H) 11/27/2022   EGFR 22 (L) 11/08/2022   GFRNONAA 22 (L) 11/27/2022   GFRAA 60 11/21/2020   NA 138 11/27/2022   K 3.6 11/27/2022   CALCIUM 8.9 11/27/2022   CO2 26 11/27/2022   GLUCOSE 130 (H) 11/27/2022    Lab Results  Component Value Date/Time   HGBA1C 6.5 (H) 08/06/2022 09:59 AM   HGBA1C 6.5 (H) 03/26/2022 02:03 PM    Last  diabetic Eye exam:  Lab Results  Component Value Date/Time   HMDIABEYEEXA No Retinopathy 01/07/2022 12:00 AM    Last diabetic Foot exam: No results found for: "HMDIABFOOTEX"   Lab Results  Component Value Date   CHOL 112 12/11/2021   HDL 38 (L) 12/11/2021   LDLCALC 54 12/11/2021   TRIG 107 12/11/2021   CHOLHDL 2.9 12/11/2021       Latest Ref Rng & Units 11/08/2022   10:49 AM 10/30/2022    3:40 PM 10/27/2022    7:05 AM  Hepatic Function  Total Protein 6.0 - 8.5 g/dL 7.1  7.2  7.2   Albumin 3.8 - 4.8 g/dL 3.8  3.5  3.5   AST 0 - 40 IU/L 65  48  24   ALT 0 - 44 IU/L 46  28  17   Alk Phosphatase 44 - 121 IU/L 139  114  131   Total Bilirubin 0.0 - 1.2 mg/dL 2.2  2.1  1.7   Bilirubin, Direct 0.0 - 0.2 mg/dL   0.4     Lab Results  Component Value Date/Time   TSH 2.010 03/26/2022 02:03 PM   TSH 1.331 11/23/2019 04:58 AM   TSH 3.430 05/15/2015 09:08 AM       Latest Ref Rng & Units 11/08/2022   10:49 AM 11/02/2022    4:42 AM 11/01/2022    5:04 AM  CBC  WBC 3.4 - 10.8 x10E3/uL 5.4  5.1  6.0   Hemoglobin 13.0 - 17.7 g/dL 13.6  12.5  12.4   Hematocrit 37.5 - 51.0 % 41.5  39.5  38.2   Platelets 150 - 450 x10E3/uL 126  120  120     Lab Results  Component Value Date/Time   VD25OH 46.58 04/19/2020 04:16 PM   VD25OH 47.4 05/15/2015 09:08 AM   DXAJOINO67 6,720 02/09/2020 10:54 AM    Clinical ASCVD: Yes  The ASCVD Risk score (Arnett DK, et al., 2019) failed to calculate for the following reasons:   The 2019 ASCVD risk score is only valid for ages 21 to 65   The patient has a prior MI or stroke diagnosis       08/06/2022    9:00 AM 05/07/2022   11:20 AM 03/29/2022    2:01 PM  Depression screen PHQ 2/9  Decreased Interest 0 1 1  Down, Depressed, Hopeless 0 1 1  PHQ - 2 Score 0 2 2  Altered sleeping  0 0  Tired, decreased energy  2 3  Change in appetite  0 0  Feeling bad or failure about yourself   0 1  Trouble concentrating  0 0  Moving slowly or fidgety/restless  0 0   Suicidal thoughts  0 0  PHQ-9 Score  4 6  Difficult doing work/chores  Not difficult at all Not difficult at all    Social History   Tobacco Use  Smoking Status Former   Packs/day: 1.00   Types: Cigarettes   Quit date: 11/26/1999   Years since quitting: 23.0  Smokeless Tobacco Never   BP Readings from Last 3 Encounters:  11/27/22 (!) 142/90  11/11/22 91/74  11/08/22 126/69   Pulse Readings from Last 3 Encounters:  11/27/22 60  11/11/22 62  11/08/22 63   Wt Readings from Last 3 Encounters:  11/27/22 190 lb (86.2 kg)  11/11/22 179 lb 2 oz (81.3 kg)  11/08/22 177 lb (80.3 kg)   BMI Readings from Last 3 Encounters:  11/27/22 27.26 kg/m  11/11/22 25.70 kg/m  11/08/22 25.40 kg/m    Allergies  Allergen Reactions   Amlodipine Besylate Swelling    Had a reaction when taking with colcrys    Crestor [Rosuvastatin]     Muscle cramps and pain   Rocephin [Ceftriaxone]     unknown    Medications Reviewed Today     Reviewed by Alisa Graff, FNP (Family Nurse Practitioner) on 11/27/22 at 5  Med List Status: <None>   Medication Order Taking? Sig Documenting Provider Last Dose Status Informant  allopurinol (ZYLOPRIM) 300 MG tablet [id number] Yes Take 0.5 tablets (150 mg total) by mouth daily. Birdie Sons, MD Taking Active Spouse/Significant Other  apixaban (ELIQUIS) 2.5 MG TABS tablet [id number] Yes Take 2 tablets (5 mg total) by mouth 2 (two) times daily. Richarda Osmond, MD Taking Active   aspirin 81 MG EC tablet [id number] Yes Take 81 mg by mouth daily.  [provider] Taking Active Spouse/Significant Other           Med Note Daron Offer A   Wed Jan 24, 2021  8:17 AM)    atorvastatin (LIPITOR) 40 MG tablet [id number] Yes TAKE 1 TABLET(40 MG) BY MOUTH DAILY Birdie Sons, MD Taking Active Spouse/Significant Other  Calcium Carbonate Antacid 600 MG chewable tablet [id number] Yes Chew 1 tablet by mouth daily. [provider] Taking Active  Spouse/Significant Other  carvedilol (COREG) 12.5 MG tablet [id number] Yes Take 12.5 mg by mouth 2 (two) times daily with a meal. [provider] Taking Active Spouse/Significant Other  glucose blood (ONETOUCH VERIO) test strip  [id number] Yes Use as instructed to check sugar daily [name], [name], MD Taking Active Spouse/Significant Other  isosorbide mononitrate (IMDUR) 30 MG 24 hr tablet [id number] Yes Take 0.5 tablets (15 mg total) by mouth daily. Richarda Osmond, MD Taking Active   Magnesium 200 MG TABS [id number] Yes Take 1 tablet (200 mg total) by mouth in the morning and at bedtime. Darylene Price A, FNP Taking Active   pantoprazole (PROTONIX) 40 MG tablet [id number] No TAKE 1 TABLET BY MOUTH EVERY DAY  Patient not taking: Reported on 11/27/2022   Birdie Sons, MD Not Taking Active   torsemide (DEMADEX) 20 MG tablet [id number] Yes Take 60 mg by mouth daily. [provider] Taking Active Spouse/Significant Other           Med Note Michaelle Birks, Moustapha Tooker A   Tue Jan 23, 2021  2:10 PM) Prescribed by Dr. Nehemiah Massed   triamcinolone ointment (KENALOG) 0.1 % [id number] Yes APPLY TOPICALLY TWICE DAILY AS DIRECTED Birdie Sons, MD Taking Active Spouse/Significant Other            Patient Active Problem List   Diagnosis Date Noted   Severe mitral regurgitation 10/31/2022   HFrEF (heart failure with reduced ejection fraction) ([place]) 10/31/2022   Acute on chronic respiratory failure with hypoxia ([place]) 10/31/2022   Weakness 10/30/2022   Acute pulmonary edema ([place]) 10/30/2022   DOE (dyspnea on exertion) 10/30/2022   Hypoxia 10/30/2022   Abdominal aortic aneurysm (AAA) ([place]) 10/30/2022   Dyspnea 10/30/2022   Orthopnea 10/15/2022   Acute on chronic systolic congestive heart failure ([place]) 10/15/2022   Carotid stenosis, symptomatic w/o infarct, left 07/05/2022   Atrophy of kidney 09/27/2021   Atherosclerotic heart disease of native coronary artery with unspecified angina pectoris  ([place]) 08/22/2021   Implantable cardioverter-defibrillator (ICD) in situ 08/22/2021   Hypertension 08/22/2021   Idiopathic gout, unspecified site 08/22/2021   Pure hypercholesterolemia, unspecified 08/22/2021   Aortic atherosclerosis ([place]) 05/08/2021   Type 2 diabetes mellitus with diabetic chronic kidney disease ([place]) 11/21/2020   AKI (acute kidney injury) ([place]) 08/10/2020   Hypomagnesemia 04/26/2020   Numbness and tingling in both hands 04/19/2020   Atrial fibrillation ([place]) 04/19/2020   Other specified interstitial pulmonary diseases ([place]) 12/06/2019   Abdominal pain    Hyperuricemia 03/31/2019   Numbness 05/07/2018   Benign colonic polyp 03/24/2017   Tibialis anterior tenosynovitis 12/21/2015   Pleural nodule 07/27/2015   Restrictive lung disease 05/12/2015   Barrett esophagus 03/30/2015   Ache in joint 03/30/2015   Bradycardia 03/30/2015   CAD S/P percutaneous coronary angioplasty 03/30/2015   Cardiac defibrillator in place 03/30/2015   Chronic kidney disease (CKD), stage III (moderate) ([place]) 03/30/2015   Cluster headache syndrome 03/30/2015   Degeneration of lumbar or lumbosacral intervertebral disc 03/30/2015   Gout 03/30/2015   Personal history of malignant neoplasm of bladder 03/30/2015   Hypercholesteremia 03/30/2015   LBP (low back pain) 03/30/2015   Personal history of malignant melanoma of skin 03/30/2015   Arthritis of knee, degenerative 03/30/2015   Obstructive sleep apnea 03/30/2015   Chronic venous insufficiency 03/30/2015   Psoriatic arthritis ([place]) 03/30/2015   History of abdominal aortic aneurysm (AAA) 06/24/2013   History of CVA (cerebrovascular accident) 06/24/2013   Cardiomyopathy, ischemic 06/24/2013   Essential (primary) hypertension 08/17/2011   Arrhythmia, sinus node 08/17/2011   History of ventricular fibrillation 74/25/9563   Systolic CHF with reduced left ventricular function, NYHA class 3 ([place]) 02/26/2010   Disturbances of vision due to  cerebrovascular disease 04/27/2007   Cardiac pacemaker in situ 10/10/2006    Immunization History  Administered Date(s) Administered   Fluad Quad(high Dose 65+) 10/04/2020, 08/06/2022   Influenza Split 09/18/2011   Influenza, High Dose Seasonal PF 08/16/2014, 10/09/2016, 10/09/2017, 08/26/2018   Influenza,inj,Quad PF,6+ Mos 12/20/2015   PFIZER(Purple Top)SARS-COV-2 Vaccination 02/24/2020, 03/21/2020   Pneumococcal Conjugate-13 05/09/2014   Pneumococcal Polysaccharide-23 05/12/2015     Compliance/Adherence/Medication fill history: Care Gaps: Ophthalmology Exam  Shingrix, Covid, Pneumonia Vaccines   Star-Rating Drugs: Atorvastatin 40 mg: LF 12/11/21 for 90-DS Losartan 50 mg: LF 01/10/22 for 90-DS Metformin XR: LF 12/11/21 for 90-DS  SDOH:  (Social Determinants of Health) assessments and interventions performed: Yes SDOH Interventions    Flowsheet Row Telephone from 11/04/2022 in [place] from 05/07/2022 in [place] Management from 01/21/2022 in [place] Visit from 12/11/2021 in [place] Management from 06/04/2021 in [place] Visit from 05/08/2021 in [place] Interventions Intervention Not Indicated Intervention Not Indicated -- -- -- --  Housing Interventions Intervention Not Indicated Intervention Not Indicated -- -- -- --  Transportation Interventions Intervention Not Indicated Intervention Not Indicated -- -- -- --  Depression Interventions/Treatment  -- Medication, Currently on Treatment -- PHQ2-9 Score <4 Follow-up Not Indicated -- PHQ2-9 Score <4 Follow-up Not Indicated  Financial Strain Interventions -- Intervention Not Indicated Intervention Not Indicated -- Intervention Not Indicated --  Physical Activity Interventions -- Patient Refused -- -- -- --   Stress Interventions -- Intervention Not Indicated -- -- -- --  Social Connections Interventions -- Intervention Not Indicated -- -- -- --      SDOH Screenings   Food Insecurity: No Food Insecurity (11/04/2022)  Housing: Low Risk  (11/04/2022)  Transportation Needs: No Transportation Needs (11/04/2022)  Utilities: Not At Risk (10/31/2022)  Alcohol Screen: Low Risk  (05/07/2022)  Depression (PHQ2-9): Low Risk  (08/06/2022)  Financial Resource Strain: Low Risk  (05/07/2022)  Physical Activity: Insufficiently Active (04/18/2021)  Social Connections: Moderately Isolated (05/07/2022)  Stress: No Stress Concern Present (05/07/2022)  Tobacco Use: Medium Risk (11/27/2022)    Medication Assistance: None required.  Patient affirms current coverage meets needs.  Medication Access: Within the past 30 days, how often has patient missed a dose of medication? None Is a pillbox or other method used to improve adherence? Yes  Factors that may affect medication adherence? no barriers identified Are meds synced by current pharmacy? No  Are meds delivered by current pharmacy? No  Does patient experience delays in picking up medications due to transportation concerns? No   Upstream Services Reviewed: Is patient disadvantaged to use [facility]?: No  Current Rx insurance plan: [facility] Name and location of Current pharmacy:  [facility] [place], [address] [place] [address] Phone: [phone] Fax: [phone]  [facility] Pharm[facility] services reviewed with patient today?: No  Patient requests to transfer care to [facility] Pharmacy?: No  Reason patient declined to change pharmacies: Not mentioned at this visit   Assessment/Plan    Heart Failure (Goal: control symptoms and prevent exacerbations) -Uncontrolled Type: Systolic -NYHA Class: III (marked limitation of activity) -Ejection fraction: 35-40% (Date: Sep 2021) -s/p  AICD  -Current treatment: Carvedilol 12.5 mg twice daily  Imdur 30 mg 1/2 tablet daily  Torsemide 60 mg daily  -Medications previously tried: Entresto, Amlodipine, valsartan, spironolactone, Carvedilol -Current home BP/HR readings:  132/84, pulse 71. 100-130s/60-70s   -Current home daily weights:  Reports dry weight is closer to 173. Patient reported weights over past week:   Weight  4-Jan 175  5-Jan 180  6-Jan 180  7-Jan 183  8-Jan 186  9-Jan Did not check   Of note, weight was 184 at Cardiology f/u on 11/21/22 and 190 on 11/27/22. Both providers state patient appears euvolemic.   Hyperlipidemia: (LDL goal < 70) -History of CAD, history of CVA  -Controlled -Current treatment: Atorvastatin 40 mg daily  -Medications previously tried: NA  -Educated on Benefits of statin for ASCVD risk reduction; Importance of limiting foods high in cholesterol; -Recommended to continue current medication  Diabetes (A1c goal <8%) -Diagnosed Jan 2021 -Controlled -Current medications: None -Medications previously tried: NA  -Current home glucose readings fasting glucose: 120 -Denies hypoglycemic/hyperglycemic symptoms  -Current meal patterns: cut out sodas. Replaced with twist water. 2 sandwiches with lunch down to 1.  -Continue current medications   Atrial Fibrillation (Goal: prevent stroke and major bleeding) -Controlled -CHADSVASC: 8 -Current treatment: Rate control: Carvedilol 6.25 mg twice daily: Appropriate, Effective, Safe, Accessible Anticoagulation: Eliquis 2.5 mg daily: Appropriate, Effective, Safe, Accessible   -Medications previously tried: NA -Home BP and HR readings: NA  -Continue current medications  Gout (Goal: prevent gout flares) -Controlled -Last Uric acid elevated above goal, but patient asymptomatic.  -Current treatment  Allopurinol 300 mg 1/2 tablets daily  -Medications previously tried: NA  -Last flare: 2 years  -Counseled on diet and exercise  extensively Recommended to continue current medication  GERD (Goal: Minimize symptoms of heartburn or reflux) -History of Barrett's Esophagus  -Controlled -Current treatment  Pantoprazole 40 mg daily  -Medications previously tried: NA  -Recommended to continue current medication  Chronic Kidney Disease Stage 3b-4  -All medications assessed for renal dosing and appropriateness in chronic kidney disease. -Recommended to continue current medication  [name], PharmD, [place], CPP  Clinical Pharmacist Practitioner  [facility] [phone]

## 2022-12-04 ENCOUNTER — Telehealth: Payer: Self-pay

## 2022-12-04 NOTE — Progress Notes (Cosign Needed Addendum)
Per Clinical pharmacist, please reach out to [place]. [place] on 12/04/2022 and check if he has weighed himself. Let me know if his weight is 189 or higher.   Patient reports his weight is 187 pounds on 12/04/2022.Notified Clinical pharmacist.  [place] Clinical Pharmacist Assistant [phone]

## 2022-12-05 DIAGNOSIS — J9621 Acute and chronic respiratory failure with hypoxia: Secondary | ICD-10-CM | POA: Diagnosis not present

## 2022-12-24 ENCOUNTER — Encounter: Payer: Self-pay | Admitting: Family Medicine

## 2022-12-24 ENCOUNTER — Ambulatory Visit (INDEPENDENT_AMBULATORY_CARE_PROVIDER_SITE_OTHER): Payer: Medicare Other | Admitting: Family Medicine

## 2022-12-24 ENCOUNTER — Other Ambulatory Visit
Admission: RE | Admit: 2022-12-24 | Discharge: 2022-12-24 | Disposition: A | Discharge status: Home / Self Care | Payer: Medicare Other | Attending: Family Medicine | Admitting: Family Medicine

## 2022-12-24 VITALS — BP 117/82 | HR 64 | Temp 97.6°F | Ht 70.0 in | Wt 195.0 lb

## 2022-12-24 DIAGNOSIS — R0601 Orthopnea: Secondary | ICD-10-CM

## 2022-12-24 DIAGNOSIS — I10 Essential (primary) hypertension: Secondary | ICD-10-CM

## 2022-12-24 DIAGNOSIS — I130 Hypertensive heart and chronic kidney disease with heart failure and stage 1 through stage 4 chronic kidney disease, or unspecified chronic kidney disease: Secondary | ICD-10-CM | POA: Diagnosis not present

## 2022-12-24 DIAGNOSIS — I5022 Chronic systolic (congestive) heart failure: Secondary | ICD-10-CM | POA: Insufficient documentation

## 2022-12-24 DIAGNOSIS — N184 Chronic kidney disease, stage 4 (severe): Secondary | ICD-10-CM | POA: Diagnosis not present

## 2022-12-24 DIAGNOSIS — R0609 Other forms of dyspnea: Secondary | ICD-10-CM | POA: Diagnosis not present

## 2022-12-24 DIAGNOSIS — I5020 Unspecified systolic (congestive) heart failure: Secondary | ICD-10-CM | POA: Diagnosis not present

## 2022-12-24 DIAGNOSIS — E1122 Type 2 diabetes mellitus with diabetic chronic kidney disease: Secondary | ICD-10-CM | POA: Diagnosis not present

## 2022-12-24 LAB — COMPREHENSIVE METABOLIC PANEL
ALT: 13 U/L (ref 0–44)
AST: 31 U/L (ref 15–41)
Albumin: 3.1 g/dL — ABNORMAL LOW (ref 3.5–5.0)
Alkaline Phosphatase: 130 U/L — ABNORMAL HIGH (ref 38–126)
Anion gap: 11 (ref 5–15)
BUN: 55 mg/dL — ABNORMAL HIGH (ref 8–23)
CO2: 28 mmol/L (ref 22–32)
Calcium: 8.8 mg/dL — ABNORMAL LOW (ref 8.9–10.3)
Chloride: 101 mmol/L (ref 98–111)
Creatinine, Ser: 2.72 mg/dL — ABNORMAL HIGH (ref 0.61–1.24)
GFR, Estimated: 23 mL/min — ABNORMAL LOW (ref >60)
Glucose, Bld: 122 mg/dL — ABNORMAL HIGH (ref 70–99)
Potassium: 3.1 mmol/L — ABNORMAL LOW (ref 3.5–5.1)
Sodium: 140 mmol/L (ref 135–145)
Total Bilirubin: 2.6 mg/dL — ABNORMAL HIGH (ref 0.3–1.2)
Total Protein: 6.9 g/dL (ref 6.5–8.1)

## 2022-12-24 LAB — CBC
HCT: 39 % (ref 39.0–52.0)
Hemoglobin: 12 g/dL — ABNORMAL LOW (ref 13.0–17.0)
MCH: 31 pg (ref 26.0–34.0)
MCHC: 30.8 g/dL (ref 30.0–36.0)
MCV: 100.8 fL — ABNORMAL HIGH (ref 80.0–100.0)
Platelets: 89 10*3/uL — ABNORMAL LOW (ref 150–400)
RBC: 3.87 MIL/uL — ABNORMAL LOW (ref 4.22–5.81)
RDW: 17.3 % — ABNORMAL HIGH (ref 11.5–15.5)
WBC: 4.5 10*3/uL (ref 4.0–10.5)
nRBC: 0 % (ref 0.0–0.2)

## 2022-12-24 LAB — TSH: TSH: 3.049 u[IU]/mL (ref 0.350–4.500)

## 2022-12-24 LAB — HEMOGLOBIN A1C
Hgb A1c MFr Bld: 6.5 % — ABNORMAL HIGH (ref 4.8–5.6)
Mean Plasma Glucose: 139.85 mg/dL

## 2022-12-24 LAB — BRAIN NATRIURETIC PEPTIDE: B Natriuretic Peptide: 995.3 pg/mL — ABNORMAL HIGH (ref 0.0–100.0)

## 2022-12-24 LAB — MAGNESIUM: Magnesium: 1.8 mg/dL (ref 1.7–2.4)

## 2022-12-24 NOTE — Progress Notes (Signed)
Established patient visit   Patient: Taylor [name]   DOB: 09-12-1941   82 y.o. Male  MRN: [id number] Visit Date: 12/24/2022  Today's healthcare provider: Lelon Huh, MD   No chief complaint on file.  Subjective    HPI Patient presents today for follow up of CHF. He reports he has been much more short of breath with exertion and when laying down on his right side for the last couple of weeks, although has not worsened the last several days. No cough or or fevers. No chest pains. No increase in swelling. Was last seen at CHF clinic 11-27-22. He has home oximetry and states his oxygen level stay consistently over 90 on 2 lpm, but sometimes drop to the 70s on room air.   Medications: Outpatient Medications Prior to Visit  Medication Sig   allopurinol (ZYLOPRIM) 300 MG tablet Take 0.5 tablets (150 mg total) by mouth daily.   apixaban (ELIQUIS) 2.5 MG TABS tablet Take 2 tablets (5 mg total) by mouth 2 (two) times daily.   aspirin 81 MG EC tablet Take 81 mg by mouth daily.    atorvastatin (LIPITOR) 40 MG tablet TAKE 1 TABLET(40 MG) BY MOUTH DAILY   Calcium Carbonate Antacid 600 MG chewable tablet Chew 1 tablet by mouth daily.   carvedilol (COREG) 12.5 MG tablet Take 12.5 mg by mouth 2 (two) times daily with a meal.   glucose blood (ONETOUCH VERIO) test strip Use as instructed to check sugar daily   isosorbide mononitrate (IMDUR) 30 MG 24 hr tablet TAKE 1/2 TABLET(15 MG) BY MOUTH DAILY   Magnesium 200 MG TABS Take 1 tablet (200 mg total) by mouth in the morning and at bedtime.   pantoprazole (PROTONIX) 40 MG tablet TAKE 1 TABLET BY MOUTH EVERY DAY   torsemide (DEMADEX) 20 MG tablet Take 60 mg by mouth daily.   triamcinolone ointment (KENALOG) 0.1 % APPLY TOPICALLY TWICE DAILY AS DIRECTED   No facility-administered medications prior to visit.    Review of Systems     Objective    BP 117/82 (BP Location: Right Arm, Patient Position: Sitting, Cuff Size: Normal)   Pulse 64   Temp  97.6 F (36.4 C)   Ht '5\' 10"'$  (1.778 m)   Wt 195 lb (88.5 kg) Comment: home weight  SpO2 98% Comment: 2 lpm O2  BMI 27.98 kg/m    Physical Exam   General: Appearance:    Well developed, well nourished male in no acute distress  Eyes:    PERRL, conjunctiva/corneas clear, EOM's intact       Lungs:     Clear to auscultation bilaterally, respirations mildly labored.   Heart:    Normal heart rate. Irregularly irregular rhythm.  2/6 blowing, holosystolic murmur at apex   MS:   All extremities are intact.  2+ bipedal edema.   Neurologic:   Awake, alert, oriented x 3. No apparent focal neurological defect.         Assessment & Plan     1. DOE (dyspnea on exertion)   2. Orthopnea   3. Systolic CHF with reduced left ventricular function, NYHA class 3 (HCC) Check BNP, CMB, and CBC.   4. Essential (primary) hypertension Check magnesium and TSH.   5. Type 2 diabetes mellitus with stage 4 chronic kidney disease, without long-term current use of insulin (HCC) Check A1c.  Has appointment with renal 12/31/22 and CHF clinic 01-27-23.  Patient sent directly to [facility] outpatient labs so labs  results can be completed and reviewed within 24 hours.    Results note to advise patient to increase torsemide to 3 x '20mg'$  daily and double dose of potassium. Plan on contacting him tomorrow by telephone. Go to ER if symptoms change or worsen.  Results for orders placed or performed during the hospital encounter of 12/24/22  CBC  Result Value Ref Range   WBC 4.5 4.0 - 10.5 K/uL   RBC 3.87 (L) 4.22 - 5.81 MIL/uL   Hemoglobin 12.0 (L) 13.0 - 17.0 g/dL   HCT 39.0 39.0 - 52.0 %   MCV 100.8 (H) 80.0 - 100.0 fL   MCH 31.0 26.0 - 34.0 pg   MCHC 30.8 30.0 - 36.0 g/dL   RDW 17.3 (H) 11.5 - 15.5 %   Platelets 89 (L) 150 - 400 K/uL   nRBC 0.0 0.0 - 0.2 %  Comprehensive metabolic panel  Result Value Ref Range   Sodium 140 135 - 145 mmol/L   Potassium 3.1 (L) 3.5 - 5.1 mmol/L   Chloride 101 98 - 111 mmol/L    CO2 28 22 - 32 mmol/L   Glucose, Bld 122 (H) 70 - 99 mg/dL   BUN 55 (H) 8 - 23 mg/dL   Creatinine, Ser 2.72 (H) 0.61 - 1.24 mg/dL   Calcium 8.8 (L) 8.9 - 10.3 mg/dL   Total Protein 6.9 6.5 - 8.1 g/dL   Albumin 3.1 (L) 3.5 - 5.0 g/dL   AST 31 15 - 41 U/L   ALT 13 0 - 44 U/L   Alkaline Phosphatase 130 (H) 38 - 126 U/L   Total Bilirubin 2.6 (H) 0.3 - 1.2 mg/dL   GFR, Estimated 23 (L) >60 mL/min   Anion gap 11 5 - 15  Brain natriuretic peptide  Result Value Ref Range   B Natriuretic Peptide 995.3 (H) 0.0 - 100.0 pg/mL  Magnesium  Result Value Ref Range   Magnesium 1.8 1.7 - 2.4 mg/dL  TSH  Result Value Ref Range   TSH 3.049 0.350 - 4.500 uIU/mL         The entirety of the information documented in the History of Present Illness, Review of Systems and Physical Exam were personally obtained by me. Portions of this information were initially documented by the CMA and reviewed by me for thoroughness and accuracy.     Lelon Huh, MD  [place] [phone] (phone) [phone] (fax)  [place]

## 2022-12-24 NOTE — Patient Instructions (Signed)
.   Please review the attached list of medications and notify my office if there are any errors.   . Please bring all of your medications to every appointment so we can make sure that our medication list is the same as yours.   

## 2022-12-25 ENCOUNTER — Ambulatory Visit: Payer: Self-pay | Admitting: *Deleted

## 2022-12-25 NOTE — Patient Outreach (Signed)
  Care Coordination   Follow Up Visit Note   12/25/2022 Name: Taylor [name] MRN: [id number] DOB: 10/01/1941  Taylor [name] is a 82 y.o. year old male who sees [name], [name], MD for primary care. I spoke with  Taylor [name] by phone today.  What matters to the patients health and wellness today?  Relief of shortness of breath. State he got up at 7am this morning to go get breakfast, it is currently 10am and he is yet to finish dressing due to breathing. Advised to go to ED, he declines.  PCP office is aware and will contact patient directly for further instructions.     Goals Addressed             This Visit's Progress    Develop Plan of care for Management of CHF   Not on track    Care Coordination Interventions: Basic overview and discussion of pathophysiology of Heart Failure reviewed Provided education on low sodium diet Reviewed Heart Failure Action Plan in depth and provided written copy Assessed need for readable accurate scales in home Advised patient to weigh each morning after emptying bladder Discussed importance of daily weight and advised patient to weigh and record daily Reviewed role of diuretics in prevention of fluid overload and management of heart failure; Discussed deep breathing with use of oxygen.   Follow up appointments:  nephrology on 2/6, cardiology on 2/19, HF clinic on 3/4 Weight: state he has gained about 20 pounds since Christmas Was seen by PCP office yesterday for shortness of breath and DOE.  State there is no change in his breathing today from yesterday.  He is still having a hard time breathing, panting and hard to speak in full sentences without stopping a breath  Discussed use of medications as he state he was called and told to take Potassium and to increase Torsemide to '60mg'$  daily, which is already on.  State he is waiting on call back from PCP office.  Report EF is less than 20%         SDOH assessments and interventions completed:  No      Care Coordination Interventions:  Yes, provided   Follow up plan: Follow up call scheduled for 2/7    Encounter Outcome:  Pt. Visit Completed   Valente David, RN, MSN, [place] Management Care Management Coordinator [phone]

## 2022-12-25 NOTE — Patient Instructions (Signed)
Visit Information  Thank you for taking time to visit with me today. Please don't hesitate to contact me if I can be of assistance to you before our next scheduled telephone appointment.  Following are the goals we discussed today:  Go to Urgent Care or ED if you do not feel better with breathing.   Our next appointment is by telephone on 2/7  Please call the care guide team at [phone] if you need to cancel or reschedule your appointment.   Please call the Suicide and Crisis Lifeline: 988 call the [place] National Suicide Prevention Lifeline: 8127113147 or TTY: (617)872-8034 TTY [phone]) to talk to a trained counselor call 1-800-273-TALK (toll free, 24 hour hotline) call 911 if you are experiencing a Mental Health or [place] or need someone to talk to.  Patient verbalizes understanding of instructions and care plan provided today and agrees to view in [place]. Active MyChart status and patient understanding of how to access instructions and care plan via MyChart confirmed with patient.     The patient has been provided with contact information for the care management team and has been advised to call with any health related questions or concerns.   Valente David, RN, MSN, [place] Care Management Care Management Coordinator [phone]

## 2022-12-28 ENCOUNTER — Other Ambulatory Visit: Payer: Self-pay | Admitting: Family Medicine

## 2022-12-30 NOTE — Telephone Encounter (Signed)
Requested by interface surescripts.  Future visit in 4 days. Requested Prescriptions  Pending Prescriptions Disp Refills   atorvastatin (LIPITOR) 40 MG tablet [Pharmacy Med Name: ATORVASTATIN '40MG'$  TABLETS] 90 tablet 1    Sig: TAKE 1 TABLET(40 MG) BY MOUTH DAILY     Cardiovascular:  Antilipid - Statins Failed - 12/28/2022  7:06 AM      Failed - Lipid Panel in normal range within the last 12 months    Cholesterol, Total  Date Value Ref Range Status  12/11/2021 112 100 - 199 mg/dL Final   LDL Chol Calc (NIH)  Date Value Ref Range Status  12/11/2021 54 0 - 99 mg/dL Final   HDL  Date Value Ref Range Status  12/11/2021 38 (L) >39 mg/dL Final   Triglycerides  Date Value Ref Range Status  12/11/2021 107 0 - 149 mg/dL Final         Passed - Patient is not pregnant      Passed - Valid encounter within last 12 months    Recent Outpatient Visits           6 days ago DOE (dyspnea on exertion)   [place], Donald E, MD   1 month ago Upper respiratory tract infection, unspecified type   [place], Donald E, MD   2 months ago Acute pulmonary edema [facility])   [place] Tally Joe T, FNP   2 months ago Viral upper respiratory tract infection   [place], Donald E, MD   2 months ago Acute on chronic congestive heart failure, unspecified heart failure type [facility])   [place], Donald E, MD       Future Appointments             In 4 days [name], [name], MD [facility] [place], [place]

## 2022-12-31 DIAGNOSIS — L408 Other psoriasis: Secondary | ICD-10-CM | POA: Diagnosis not present

## 2022-12-31 DIAGNOSIS — T82898A Other specified complication of vascular prosthetic devices, implants and grafts, initial encounter: Secondary | ICD-10-CM | POA: Diagnosis not present

## 2022-12-31 DIAGNOSIS — N184 Chronic kidney disease, stage 4 (severe): Secondary | ICD-10-CM | POA: Diagnosis not present

## 2022-12-31 DIAGNOSIS — E1122 Type 2 diabetes mellitus with diabetic chronic kidney disease: Secondary | ICD-10-CM | POA: Diagnosis not present

## 2022-12-31 DIAGNOSIS — I25119 Atherosclerotic heart disease of native coronary artery with unspecified angina pectoris: Secondary | ICD-10-CM | POA: Diagnosis not present

## 2022-12-31 DIAGNOSIS — M1000 Idiopathic gout, unspecified site: Secondary | ICD-10-CM | POA: Diagnosis not present

## 2022-12-31 DIAGNOSIS — N261 Atrophy of kidney (terminal): Secondary | ICD-10-CM | POA: Diagnosis not present

## 2022-12-31 DIAGNOSIS — Z95810 Presence of automatic (implantable) cardiac defibrillator: Secondary | ICD-10-CM | POA: Diagnosis not present

## 2022-12-31 DIAGNOSIS — I509 Heart failure, unspecified: Secondary | ICD-10-CM | POA: Diagnosis not present

## 2022-12-31 DIAGNOSIS — I10 Essential (primary) hypertension: Secondary | ICD-10-CM | POA: Diagnosis not present

## 2022-12-31 DIAGNOSIS — E7800 Pure hypercholesterolemia, unspecified: Secondary | ICD-10-CM | POA: Diagnosis not present

## 2022-12-31 DIAGNOSIS — U071 COVID-19: Secondary | ICD-10-CM | POA: Diagnosis not present

## 2023-01-01 ENCOUNTER — Encounter: Payer: Self-pay | Admitting: *Deleted

## 2023-01-01 ENCOUNTER — Ambulatory Visit: Payer: Self-pay | Admitting: *Deleted

## 2023-01-01 NOTE — Patient Outreach (Signed)
  Care Coordination   Follow Up Visit Note   01/01/2023 Name: Taylor [name] MRN: [id number] DOB: [birth year]  Taylor [name] is a 82 y.o. year old male who sees [name], [name], MD for primary care. I spoke with  [name] by phone today.  What matters to the patients health and wellness today?  Patient still has shortness of breath and cough, state he does not feel better but still does not want to be seen in ED.  Was seen by nephrology yesterday, will see PCP on Friday.    Goals Addressed             This Visit's Progress    Develop Plan of care for Management of CHF   Not on track    Care Coordination Interventions: Basic overview and discussion of pathophysiology of Heart Failure reviewed Provided education on low sodium diet Reviewed Heart Failure Action Plan in depth and provided written copy Assessed need for readable accurate scales in home Advised patient to weigh each morning after emptying bladder Discussed importance of daily weight and advised patient to weigh and record daily Reviewed role of diuretics in prevention of fluid overload and management of heart failure; Discussed deep breathing with use of oxygen.   Follow up appointments:  PCP on 2/9, cardiology on 2/19, HF clinic on 3/4. Anticipate new appointment with pulmonary after visit on Friday Weight: state he has gained about 20 pounds since Christmas Was seen by PCP office yesterday for shortness of breath and DOE.  State there is no change in his breathing today from yesterday.  He is still having a hard time breathing, panting and hard to speak in full sentences without stopping a breath  Discussed use of medications as he state he was called and told to take Potassium and to increase Torsemide to '60mg'$  daily, which is already on.  He was advised by PCP office to continue current dose. Report EF is less than 20%         SDOH assessments and interventions completed:  No     Care Coordination  Interventions:  Yes, provided   Follow up plan: Follow up call scheduled for 2/13    Encounter Outcome:  Pt. Visit Completed   Valente David, RN, MSN, [place] Care Management Care Management Coordinator [phone]

## 2023-01-01 NOTE — Telephone Encounter (Signed)
This encounter was created in error - please disregard.

## 2023-01-02 NOTE — Progress Notes (Unsigned)
   {  ES VIEW SCHEDULE WEB***:}  Established patient visit   Patient: Taylor [name]   DOB: 27-Feb-1941   82 y.o. Male  MRN: [id number] Visit Date: 01/03/2023  Today's healthcare provider: Lelon Huh, MD   No chief complaint on file.  Subjective    HPI  Patient is an 82 year old male who presents for follow up of dyspnea on exertion.  He was seen for this on 12/24/22. Management changes at that time included increasing Potassium as a result of labs done during visit.  He reports that he was getting better but then in the last week he has gotten worse than he was when seen last.  He is having increased coughing and while in the office is having difficulty answering questions with being interrupted due to cough. He states he at times coughs so much he feels he is going to pass out.   Medications: Outpatient Medications Prior to Visit  Medication Sig   allopurinol (ZYLOPRIM) 300 MG tablet Take 0.5 tablets (150 mg total) by mouth daily.   apixaban (ELIQUIS) 2.5 MG TABS tablet Take 2 tablets (5 mg total) by mouth 2 (two) times daily.   aspirin 81 MG EC tablet Take 81 mg by mouth daily.    atorvastatin (LIPITOR) 40 MG tablet TAKE 1 TABLET(40 MG) BY MOUTH DAILY   Calcium Carbonate Antacid 600 MG chewable tablet Chew 1 tablet by mouth daily.   carvedilol (COREG) 12.5 MG tablet Take 12.5 mg by mouth 2 (two) times daily with a meal.   glucose blood (ONETOUCH VERIO) test strip Use as instructed to check sugar daily   isosorbide mononitrate (IMDUR) 30 MG 24 hr tablet TAKE 1/2 TABLET(15 MG) BY MOUTH DAILY   Magnesium 200 MG TABS Take 1 tablet (200 mg total) by mouth in the morning and at bedtime.   pantoprazole (PROTONIX) 40 MG tablet TAKE 1 TABLET BY MOUTH EVERY DAY   torsemide (DEMADEX) 20 MG tablet Take 60 mg by mouth daily.   triamcinolone ointment (KENALOG) 0.1 % APPLY TOPICALLY TWICE DAILY AS DIRECTED   No facility-administered medications prior to visit.    Review of Systems   Constitutional:  Positive for fatigue. Negative for chills, diaphoresis and fever.  Respiratory:  Positive for cough, choking (at times), shortness of breath and wheezing.   Cardiovascular:  Positive for leg swelling (feet). Negative for chest pain and palpitations.    {Labs  Heme  Chem  Endocrine  Serology  Results Review (optional):23779}   Objective    BP (!) 123/91 (BP Location: Right Arm, Patient Position: Sitting, Cuff Size: Normal)   Pulse 69   Temp 97.9 F (36.6 C) (Oral)   Wt 197 lb (89.4 kg)   SpO2 100% Comment: on 2 lpm of O2  BMI 28.27 kg/m  Vitals:   01/03/23 0957 01/03/23 1001  BP: (!) 118/92 (!) 123/91  Pulse: 69   Temp: 97.9 F (36.6 C)   TempSrc: Oral   SpO2: 100%   Weight: 197 lb (89.4 kg)      Physical Exam  ***  No results found for any visits on 01/03/23.  Assessment & Plan     ***  No follow-ups on file.      {provider attestation***:1}   Lelon Huh, MD  [place] [phone] (phone) [phone] (fax)  [address]

## 2023-01-03 ENCOUNTER — Ambulatory Visit
Admission: RE | Admit: 2023-01-03 | Discharge: 2023-01-03 | Disposition: A | Discharge status: Home / Self Care | Payer: Medicare Other | Source: Ambulatory Visit | Attending: Family Medicine | Admitting: Family Medicine

## 2023-01-03 ENCOUNTER — Ambulatory Visit
Admission: RE | Admit: 2023-01-03 | Discharge: 2023-01-03 | Disposition: A | Discharge status: Home / Self Care | Payer: Medicare Other | Attending: Family Medicine | Admitting: Family Medicine

## 2023-01-03 ENCOUNTER — Ambulatory Visit (INDEPENDENT_AMBULATORY_CARE_PROVIDER_SITE_OTHER): Payer: Medicare Other | Admitting: Family Medicine

## 2023-01-03 VITALS — BP 123/91 | HR 69 | Temp 97.9°F | Wt 197.0 lb

## 2023-01-03 DIAGNOSIS — I5023 Acute on chronic systolic (congestive) heart failure: Secondary | ICD-10-CM

## 2023-01-03 DIAGNOSIS — R0609 Other forms of dyspnea: Secondary | ICD-10-CM

## 2023-01-03 DIAGNOSIS — R0602 Shortness of breath: Secondary | ICD-10-CM

## 2023-01-03 DIAGNOSIS — R058 Other specified cough: Secondary | ICD-10-CM

## 2023-01-03 DIAGNOSIS — J449 Chronic obstructive pulmonary disease, unspecified: Secondary | ICD-10-CM | POA: Insufficient documentation

## 2023-01-03 DIAGNOSIS — I4891 Unspecified atrial fibrillation: Secondary | ICD-10-CM | POA: Diagnosis not present

## 2023-01-03 DIAGNOSIS — J9621 Acute and chronic respiratory failure with hypoxia: Secondary | ICD-10-CM | POA: Diagnosis not present

## 2023-01-03 DIAGNOSIS — R059 Cough, unspecified: Secondary | ICD-10-CM | POA: Diagnosis not present

## 2023-01-03 DIAGNOSIS — I714 Abdominal aortic aneurysm, without rupture, unspecified: Secondary | ICD-10-CM

## 2023-01-03 LAB — POC COVID19 BINAXNOW: SARS Coronavirus 2 Ag: NEGATIVE

## 2023-01-03 MED ORDER — ALBUTEROL SULFATE HFA 108 (90 BASE) MCG/ACT IN AERS: 2.0000 | INHALATION_SPRAY | Freq: Four times a day (QID) | RESPIRATORY_TRACT | 2 refills | Status: DC | PRN

## 2023-01-03 MED ORDER — PREDNISONE 10 MG PO TABS: ORAL_TABLET | ORAL | 0 refills | Status: AC

## 2023-01-03 MED ORDER — AZITHROMYCIN 250 MG PO TABS: ORAL_TABLET | ORAL | 0 refills | Status: AC

## 2023-01-04 LAB — CBC WITH DIFFERENTIAL/PLATELET

## 2023-01-05 DIAGNOSIS — J9621 Acute and chronic respiratory failure with hypoxia: Secondary | ICD-10-CM | POA: Diagnosis not present

## 2023-01-07 ENCOUNTER — Ambulatory Visit: Payer: Self-pay | Admitting: *Deleted

## 2023-01-07 LAB — COMPREHENSIVE METABOLIC PANEL
ALT: 14 IU/L (ref 0–44)
AST: 45 IU/L — ABNORMAL HIGH (ref 0–40)
Albumin/Globulin Ratio: 1.1 — ABNORMAL LOW (ref 1.2–2.2)
Albumin: 3.5 g/dL — ABNORMAL LOW (ref 3.8–4.8)
Alkaline Phosphatase: 174 IU/L — ABNORMAL HIGH (ref 44–121)
BUN/Creatinine Ratio: 16 (ref 10–24)
BUN: 59 mg/dL — ABNORMAL HIGH (ref 8–27)
Bilirubin Total: 2.4 mg/dL — ABNORMAL HIGH (ref 0.0–1.2)
CO2: 24 mmol/L (ref 20–29)
Calcium: 9.6 mg/dL (ref 8.6–10.2)
Chloride: 102 mmol/L (ref 96–106)
Creatinine, Ser: 3.64 mg/dL — ABNORMAL HIGH (ref 0.76–1.27)
Globulin, Total: 3.2 g/dL (ref 1.5–4.5)
Glucose: 100 mg/dL — ABNORMAL HIGH (ref 70–99)
Potassium: 5 mmol/L (ref 3.5–5.2)
Sodium: 143 mmol/L (ref 134–144)
Total Protein: 6.7 g/dL (ref 6.0–8.5)
eGFR: 16 mL/min/{1.73_m2} — ABNORMAL LOW (ref >59)

## 2023-01-07 LAB — CBC WITH DIFFERENTIAL/PLATELET
Basophils Absolute: 0 10*3/uL (ref 0.0–0.2)
Basos: 1 %
EOS (ABSOLUTE): 0.1 10*3/uL (ref 0.0–0.4)
Eos: 2 %
Hematocrit: 39.2 % (ref 37.5–51.0)
Hemoglobin: 12.4 g/dL — ABNORMAL LOW (ref 13.0–17.7)
Immature Grans (Abs): 0 10*3/uL (ref 0.0–0.1)
Immature Granulocytes: 0 %
Lymphocytes Absolute: 1.4 10*3/uL (ref 0.7–3.1)
Lymphs: 28 %
MCH: 31 pg (ref 26.6–33.0)
MCHC: 31.6 g/dL (ref 31.5–35.7)
MCV: 98 fL — ABNORMAL HIGH (ref 79–97)
Monocytes Absolute: 0.4 10*3/uL (ref 0.1–0.9)
Monocytes: 7 %
NRBC: 1 % — ABNORMAL HIGH (ref 0–0)
Neutrophils Absolute: 3 10*3/uL (ref 1.4–7.0)
Neutrophils: 62 %
Platelets: 94 10*3/uL — CL (ref 150–450)
RBC: 4 x10E6/uL — ABNORMAL LOW (ref 4.14–5.80)
RDW: 15.6 % — ABNORMAL HIGH (ref 11.6–15.4)
WBC: 4.9 10*3/uL (ref 3.4–10.8)

## 2023-01-07 LAB — BRAIN NATRIURETIC PEPTIDE: BNP: 632.9 pg/mL — ABNORMAL HIGH (ref 0.0–100.0)

## 2023-01-07 NOTE — Patient Outreach (Signed)
  Care Coordination   Follow Up Visit Note   01/07/2023 Name: Taylor [name] MRN: [id number] DOB: 09/12/1941  Taylor [name] is a 82 y.o. year old male who sees [name], Taylor Peri, MD for primary care. I spoke with  Taylor [name] by phone today.  What matters to the patients health and wellness today?  Continues to have cough and shortness of breath. Will take medications provided by PCP on 2/9, advised to follow up with provider by Monday if he still is not feeling better.     Goals Addressed             This Visit's Progress    Develop Plan of care for Management of CHF   Not on track    Care Coordination Interventions: Basic overview and discussion of pathophysiology of Heart Failure reviewed Provided education on low sodium diet Reviewed Heart Failure Action Plan in depth and provided written copy Assessed need for readable accurate scales in home Advised patient to weigh each morning after emptying bladder Discussed importance of daily weight and advised patient to weigh and record daily Reviewed role of diuretics in prevention of fluid overload and management of heart failure; Discussed deep breathing with use of oxygen.   Follow up appointment: cardiology on 2/19, HF clinic on 3/4.  Was seen by PCP office again on 2/9 for shortness of breath and DOE.  Given inhaler, steroid, and antibiotic.  State he still does not feel he is much better Discussed use of medications as he state he was called and told to take Potassium and to increase Torsemide to '60mg'$  daily, which is already on.  He was advised by PCP office to continue current dose. Report EF is less than 20%         SDOH assessments and interventions completed:  No     Care Coordination Interventions:  Yes, provided   Follow up plan: Follow up call scheduled for 2/20    Encounter Outcome:  Pt. Visit Completed   Taylor David, RN, MSN, [place] Care Management Care Management Coordinator [phone]

## 2023-01-07 NOTE — Patient Instructions (Signed)
Visit Information  Thank you for taking time to visit with me today. Please don't hesitate to contact me if I can be of assistance to you before our next scheduled telephone appointment.  Following are the goals we discussed today:  Call provider office if you still don't feel better by Monday   Our next appointment is by telephone on 2/20  Please call the care guide team at [phone] if you need to cancel or reschedule your appointment.   Please call the Suicide and Crisis Lifeline: 988 call the [place] National Suicide Prevention Lifeline: (680)062-1458 or TTY: (364)147-3858 TTY (727) 819-8623) to talk to a trained counselor call 1-800-273-TALK (toll free, 24 hour hotline) call 911 if you are experiencing a Mental Health or [place] or need someone to talk to.  Patient verbalizes understanding of instructions and care plan provided today and agrees to view in [place]. Active MyChart status and patient understanding of how to access instructions and care plan via MyChart confirmed with patient.     The patient has been provided with contact information for the care management team and has been advised to call with any health related questions or concerns.   Valente David, RN, MSN, [place] Care Management Care Management Coordinator [phone]

## 2023-01-08 ENCOUNTER — Telehealth: Payer: Self-pay | Admitting: Family

## 2023-01-08 NOTE — Telephone Encounter (Signed)
Called and followed up with [place] kidney referral we put in they have him scheduled 01/28/23 at 240pm with Dr. Darral Dash, NT

## 2023-01-13 DIAGNOSIS — I517 Cardiomegaly: Secondary | ICD-10-CM | POA: Diagnosis not present

## 2023-01-13 DIAGNOSIS — Z95810 Presence of automatic (implantable) cardiac defibrillator: Secondary | ICD-10-CM | POA: Diagnosis not present

## 2023-01-13 DIAGNOSIS — I25810 Atherosclerosis of coronary artery bypass graft(s) without angina pectoris: Secondary | ICD-10-CM | POA: Diagnosis not present

## 2023-01-13 DIAGNOSIS — Z9889 Other specified postprocedural states: Secondary | ICD-10-CM | POA: Diagnosis not present

## 2023-01-13 DIAGNOSIS — I10 Essential (primary) hypertension: Secondary | ICD-10-CM | POA: Diagnosis not present

## 2023-01-13 DIAGNOSIS — I5022 Chronic systolic (congestive) heart failure: Secondary | ICD-10-CM | POA: Diagnosis not present

## 2023-01-13 DIAGNOSIS — R001 Bradycardia, unspecified: Secondary | ICD-10-CM | POA: Diagnosis not present

## 2023-01-13 DIAGNOSIS — N184 Chronic kidney disease, stage 4 (severe): Secondary | ICD-10-CM | POA: Diagnosis not present

## 2023-01-13 DIAGNOSIS — Z01818 Encounter for other preprocedural examination: Secondary | ICD-10-CM | POA: Diagnosis not present

## 2023-01-13 DIAGNOSIS — I4901 Ventricular fibrillation: Secondary | ICD-10-CM | POA: Diagnosis not present

## 2023-01-13 DIAGNOSIS — I480 Paroxysmal atrial fibrillation: Secondary | ICD-10-CM | POA: Diagnosis not present

## 2023-01-13 DIAGNOSIS — R0989 Other specified symptoms and signs involving the circulatory and respiratory systems: Secondary | ICD-10-CM | POA: Diagnosis not present

## 2023-01-13 DIAGNOSIS — I495 Sick sinus syndrome: Secondary | ICD-10-CM | POA: Diagnosis not present

## 2023-01-13 DIAGNOSIS — Z8679 Personal history of other diseases of the circulatory system: Secondary | ICD-10-CM | POA: Diagnosis not present

## 2023-01-14 ENCOUNTER — Ambulatory Visit: Payer: Self-pay | Admitting: *Deleted

## 2023-01-14 NOTE — Patient Outreach (Signed)
  Care Coordination   Follow Up Visit Note   01/14/2023 Name: GRAYDON DUNMIRE MRN: [id number] DOB: [birth year]  DONQUARIUS COLEE is a 82 y.o. year old male who sees [name], [name], MD for primary care. I spoke with  [name] by phone today.  What matters to the patients health and wellness today?  Relief of shortness of breath.     Goals Addressed             This Visit's Progress    Develop Plan of care for Management of CHF   Not on track    Care Coordination Interventions: Basic overview and discussion of pathophysiology of Heart Failure reviewed Provided education on low sodium diet Reviewed Heart Failure Action Plan in depth and provided written copy Assessed need for readable accurate scales in home Advised patient to weigh each morning after emptying bladder Discussed importance of daily weight and advised patient to weigh and record daily Reviewed role of diuretics in prevention of fluid overload and management of heart failure; Discussed deep breathing with use of oxygen.  State he has had to increase oxygen level up to 5 liters, still having shortness of breath. Follow up appointment: HF clinic on 3/4. Has urgent visit with cardiology tomorrow morning at 77 Was seen by PCP office again on 2/9 for shortness of breath and DOE.  Given inhaler, steroid, and antibiotic.  State he still does not feel he is much better.  Discussed use of medications as he state he was called and told to take Potassium and to increase Torsemide to 68m daily, which is already on.  He was advised by PCP office to continue current dose. Report EF is less than 20% Advised to be seen at ED, declines to go, state he will wait until tomorrow         SDOH assessments and interventions completed:  No     Care Coordination Interventions:  Yes, provided   Follow up plan: Follow up call scheduled for 2/23    Encounter Outcome:  Pt. Visit Completed   MValente David RN, MSN, [facility]  Management Care Management Coordinator [phone]

## 2023-01-15 DIAGNOSIS — I495 Sick sinus syndrome: Secondary | ICD-10-CM | POA: Diagnosis not present

## 2023-01-15 DIAGNOSIS — I4901 Ventricular fibrillation: Secondary | ICD-10-CM | POA: Diagnosis not present

## 2023-01-17 ENCOUNTER — Ambulatory Visit: Payer: Self-pay

## 2023-01-17 ENCOUNTER — Ambulatory Visit: Payer: Self-pay | Admitting: *Deleted

## 2023-01-17 DIAGNOSIS — I5020 Unspecified systolic (congestive) heart failure: Secondary | ICD-10-CM

## 2023-01-17 DIAGNOSIS — J449 Chronic obstructive pulmonary disease, unspecified: Secondary | ICD-10-CM

## 2023-01-17 NOTE — Addendum Note (Signed)
Addended by: Birdie Sons on: 01/17/2023 01:06 PM   Modules accepted: Orders

## 2023-01-17 NOTE — Patient Instructions (Signed)
Visit Information  Thank you for taking time to visit with me today. Please don't hesitate to contact me if I can be of assistance to you before our next scheduled telephone appointment.  Following are the goals we discussed today:  Seek emergency medical attention if you feel worse or do not feel better by Monday.    Our next appointment is by telephone on 2/26  Please call the care guide team at [phone] if you need to cancel or reschedule your appointment.   Please call the Suicide and Crisis Lifeline: 988 call the [place] National Suicide Prevention Lifeline: 667 102 5968 or TTY: 6285076479 TTY 762 274 4478) to talk to a trained counselor call 1-800-273-TALK (toll free, 24 hour hotline) call 911 if you are experiencing a Mental Health or [place] or need someone to talk to.  Patient verbalizes understanding of instructions and care plan provided today and agrees to view in [place]. Active MyChart status and patient understanding of how to access instructions and care plan via MyChart confirmed with patient.     The patient has been provided with contact information for the care management team and has been advised to call with any health related questions or concerns.   [place] Management Care Management Coordinator [phone]

## 2023-01-17 NOTE — Telephone Encounter (Signed)
      Chief Complaint: Agent concerned because pt. Sounds SOB. Pt. States "I'm fine, I always sound like this." Denies SOV. Wants to speak to [facility] - warm transfer Symptoms: Denies symptoms Frequency: Today Pertinent Negatives: Patient denies symptoms Disposition: []$ ED /[]$ Urgent Care (no appt availability in office) / []$ Appointment(In office/virtual)/ []$  [place] [facility]/ []$ Home Care/ []$ Refused Recommended Disposition /[]$ [place]/ []$  Follow-up with PCP Additional Notes: Speaking with [facility].  Answer Assessment - Initial Assessment Questions 1. RESPIRATORY STATUS: "Describe your breathing?" (e.g., wheezing, shortness of breath, unable to speak, severe coughing)      Pt. States "always like this." 2. ONSET: "When did this breathing problem begin?"      N/a 3. PATTERN "Does the difficult breathing come and go, or has it been constant since it started?"      N/a 4. SEVERITY: "How bad is your breathing?" (e.g., mild, moderate, severe)    - MILD: No SOB at rest, mild SOB with walking, speaks normally in sentences, can lie down, no retractions, pulse < 100.    - MODERATE: SOB at rest, SOB with minimal exertion and prefers to sit, cannot lie down flat, speaks in phrases, mild retractions, audible wheezing, pulse 100-120.    - SEVERE: Very SOB at rest, speaks in single words, struggling to breathe, sitting hunched forward, retractions, pulse > 120      Pt. States "I'm fine." Sounds very SOB. 5. RECURRENT SYMPTOM: "Have you had difficulty breathing before?" If Yes, ask: "When was the last time?" and "What happened that time?"      yES 6. CARDIAC HISTORY: "Do you have any history of heart disease?" (e.g., heart attack, angina, bypass surgery, angioplasty)      nO 7. LUNG HISTORY: "Do you have any history of lung disease?"  (e.g., pulmonary embolus, asthma, emphysema)     Yes 8. CAUSE: "What do you think is causing the breathing problem?"      N/a 9. OTHER SYMPTOMS: "Do you  have any other symptoms? (e.g., dizziness, runny nose, cough, chest pain, fever)     No 10. O2 SATURATION MONITOR:  "Do you use an oxygen saturation monitor (pulse oximeter) at home?" If Yes, ask: "What is your reading (oxygen level) today?" "What is your usual oxygen saturation reading?" (e.g., 95%)       No 11. PREGNANCY: "Is there any chance you are pregnant?" "When was your last menstrual period?"       N/a 12. TRAVEL: "Have you traveled out of the country in the last month?" (e.g., travel history, exposures)       No  Protocols used: Breathing Difficulty-A-AH

## 2023-01-17 NOTE — Patient Outreach (Signed)
  Care Coordination   Follow Up Visit Note   01/17/2023 Name: MUREL GUIJOSA MRN: [id number] DOB: 03/08/1941  JO SELLARDS is a 82 y.o. year old male who sees [name], [name], MD for primary care. I spoke with  [name] by phone today.  What matters to the patients health and wellness today?  Relief of shortness of breath    Goals Addressed             This Visit's Progress    Develop Plan of care for Management of CHF   Not on track    Care Coordination Interventions: Basic overview and discussion of pathophysiology of Heart Failure reviewed Provided education on low sodium diet Reviewed Heart Failure Action Plan in depth and provided written copy Assessed need for readable accurate scales in home Advised patient to weigh each morning after emptying bladder Discussed importance of daily weight and advised patient to weigh and record daily Reviewed role of diuretics in prevention of fluid overload and management of heart failure; Discussed deep breathing with use of oxygen.  State he has had to increase oxygen level up to 5 liters, still having shortness of breath.  Oxygen saturations 98% currently Follow up appointment: HF clinic on 3/4, PCP is not until June for [place] Discussed nephrology visit yesterday, per provider, he does not have fluid on his lungs and recommended to see a pulmonary provider.  He does not have a referral, this RNCM will request from PCP  Advised to be seen at ED, declines to go, state he does not feel he is any worse than when he was seen at the provider office         SDOH assessments and interventions completed:  No     Care Coordination Interventions:  Yes, provided   Follow up plan: Follow up call scheduled for 2/26    Encounter Outcome:  Pt. Visit Completed   Valente David, RN, MSN, [place] Care Management Care Management Coordinator [phone]

## 2023-01-17 NOTE — Patient Outreach (Signed)
  Care Coordination   01/17/2023 Name: KRISHAY ALLERT MRN: [id number] DOB: 1941-08-08   Care Coordination Outreach Attempts:  An unsuccessful telephone outreach was attempted for a scheduled appointment today.  Follow Up Plan:  Additional outreach attempts will be made to offer the patient care coordination information and services.   Encounter Outcome:  No Answer   Care Coordination Interventions:  No, not indicated    Valente David, RN, MSN, [facility] Care Management Care Management Coordinator [phone]

## 2023-01-20 ENCOUNTER — Ambulatory Visit: Payer: Self-pay | Admitting: *Deleted

## 2023-01-20 NOTE — Patient Outreach (Signed)
  Care Coordination   Follow Up Visit Note   01/20/2023 Name: Taylor [name] MRN: [id number] DOB: 18-Jul-1941  Taylor [name] is a 82 y.o. year old male who sees [name], [name], MD for primary care. I spoke with Taylor [name] and  Taylor [name] by phone today.  What matters to the patients health and wellness today?   Continues to have shortness of breath, denies it is worse than last week.  Patient and wife eager to see pulmonary provider.    Goals Addressed             This Visit's Progress    Develop Plan of care for Management of CHF   On track    Care Coordination Interventions: Basic overview and discussion of pathophysiology of Heart Failure reviewed Provided education on low sodium diet Reviewed Heart Failure Action Plan in depth and provided written copy Assessed need for readable accurate scales in home Advised patient to weigh each morning after emptying bladder Discussed importance of daily weight and advised patient to weigh and record daily Reviewed role of diuretics in prevention of fluid overload and management of heart failure; Discussed deep breathing with use of oxygen.  State he has had to increase oxygen level up to 5 liters, still having shortness of breath.  Oxygen saturations 98% currently Follow up appointment: HF clinic and pulmonary on 3/4, nephrology on 3/5, PCP is not until June for Taylor [name] were placed to [facility] and appointment scheduled, however, patient is previous patient of [facility] pulmonary and has opted to schedule with them instead Advised to be seen at ED, declines to go, state he does not feel he is any worse than when he was seen at the provider office        Interventions Today    Flowsheet Row Most Recent Value  Chronic Disease   Chronic disease during today's visit Congestive Heart Failure (CHF), Chronic Obstructive Pulmonary Disease (COPD)  General Interventions   General Interventions Discussed/Reviewed General Interventions  Reviewed, Doctor Visits  Doctor Visits Discussed/Reviewed PCP, Specialist, Doctor Visits Reviewed  PCP/Specialist Visits Contact provider for referral to  [facility] provider for referral to Specialist        SDOH assessments and interventions completed:  No     Care Coordination Interventions:  Yes, provided   Follow up plan: Follow up call scheduled for 3/6    Encounter Outcome:  Pt. Visit Completed   Taylor David, RN, MSN, [place] Care Management Care Management Coordinator [phone]

## 2023-01-20 NOTE — Patient Instructions (Signed)
Visit Information  Thank you for taking time to visit with me today. Please don't hesitate to contact me if I can be of assistance to you before our next scheduled telephone appointment.  Following are the goals we discussed today:  Continue monitoring oxygen levels and wearing oxygen.   Take medications (nebs and inhalers) as instructed. Remember appointments: 3/4 both heart failure and pulmonary and 3/5 with kidney specialist  Our next appointment is by telephone on 3/6  Please call the care guide team at [phone] if you need to cancel or reschedule your appointment.   Please call the Suicide and Crisis Lifeline: 988 call the [place] National Suicide Prevention Lifeline: 520-737-9379 or TTY: 709-214-5731 TTY 334-379-3780) to talk to a trained counselor call 1-800-273-TALK (toll free, 24 hour hotline) call 911 if you are experiencing a Mental Health or [place] or need someone to talk to.  Patient verbalizes understanding of instructions and care plan provided today and agrees to view in [place]. Active MyChart status and patient understanding of how to access instructions and care plan via MyChart confirmed with patient.     The patient has been provided with contact information for the care management team and has been advised to call with any health related questions or concerns.   [place] Management Care Management Coordinator [phone]

## 2023-01-20 NOTE — Patient Outreach (Signed)
  Care Coordination   01/20/2023 Name: Taylor [name] MRN: [id number] DOB: 12-20-1940   Care Coordination Outreach Attempts:  An unsuccessful telephone outreach was attempted for a scheduled appointment today.  Follow Up Plan:  Additional outreach attempts will be made to offer the patient care coordination information and services.   Encounter Outcome:  No Answer   Care Coordination Interventions:  No, not indicated    Valente David, RN, MSN, [facility] Care Management Care Management Coordinator [phone]

## 2023-01-22 DIAGNOSIS — I509 Heart failure, unspecified: Secondary | ICD-10-CM | POA: Diagnosis not present

## 2023-01-22 DIAGNOSIS — E1122 Type 2 diabetes mellitus with diabetic chronic kidney disease: Secondary | ICD-10-CM | POA: Diagnosis not present

## 2023-01-22 DIAGNOSIS — M1000 Idiopathic gout, unspecified site: Secondary | ICD-10-CM | POA: Diagnosis not present

## 2023-01-22 DIAGNOSIS — I10 Essential (primary) hypertension: Secondary | ICD-10-CM | POA: Diagnosis not present

## 2023-01-22 DIAGNOSIS — N184 Chronic kidney disease, stage 4 (severe): Secondary | ICD-10-CM | POA: Diagnosis not present

## 2023-01-27 ENCOUNTER — Encounter: Payer: Self-pay | Admitting: Family

## 2023-01-27 ENCOUNTER — Ambulatory Visit: Payer: Medicare Other | Attending: Family | Admitting: Family

## 2023-01-27 VITALS — BP 127/93 | HR 61 | Resp 16 | Wt 202.1 lb

## 2023-01-27 DIAGNOSIS — Z87891 Personal history of nicotine dependence: Secondary | ICD-10-CM | POA: Diagnosis not present

## 2023-01-27 DIAGNOSIS — I480 Paroxysmal atrial fibrillation: Secondary | ICD-10-CM

## 2023-01-27 DIAGNOSIS — Z8679 Personal history of other diseases of the circulatory system: Secondary | ICD-10-CM | POA: Insufficient documentation

## 2023-01-27 DIAGNOSIS — Z8249 Family history of ischemic heart disease and other diseases of the circulatory system: Secondary | ICD-10-CM | POA: Insufficient documentation

## 2023-01-27 DIAGNOSIS — Z79899 Other long term (current) drug therapy: Secondary | ICD-10-CM | POA: Diagnosis not present

## 2023-01-27 DIAGNOSIS — N184 Chronic kidney disease, stage 4 (severe): Secondary | ICD-10-CM | POA: Diagnosis not present

## 2023-01-27 DIAGNOSIS — E1122 Type 2 diabetes mellitus with diabetic chronic kidney disease: Secondary | ICD-10-CM | POA: Diagnosis not present

## 2023-01-27 DIAGNOSIS — Z95810 Presence of automatic (implantable) cardiac defibrillator: Secondary | ICD-10-CM | POA: Insufficient documentation

## 2023-01-27 DIAGNOSIS — I38 Endocarditis, valve unspecified: Secondary | ICD-10-CM

## 2023-01-27 DIAGNOSIS — I5023 Acute on chronic systolic (congestive) heart failure: Secondary | ICD-10-CM | POA: Diagnosis not present

## 2023-01-27 DIAGNOSIS — I4891 Unspecified atrial fibrillation: Secondary | ICD-10-CM | POA: Diagnosis not present

## 2023-01-27 DIAGNOSIS — Z8673 Personal history of transient ischemic attack (TIA), and cerebral infarction without residual deficits: Secondary | ICD-10-CM | POA: Diagnosis not present

## 2023-01-27 DIAGNOSIS — Z8551 Personal history of malignant neoplasm of bladder: Secondary | ICD-10-CM | POA: Insufficient documentation

## 2023-01-27 DIAGNOSIS — Z8719 Personal history of other diseases of the digestive system: Secondary | ICD-10-CM | POA: Diagnosis not present

## 2023-01-27 DIAGNOSIS — Z8616 Personal history of COVID-19: Secondary | ICD-10-CM | POA: Diagnosis not present

## 2023-01-27 DIAGNOSIS — I509 Heart failure, unspecified: Secondary | ICD-10-CM

## 2023-01-27 DIAGNOSIS — G4736 Sleep related hypoventilation in conditions classified elsewhere: Secondary | ICD-10-CM | POA: Diagnosis not present

## 2023-01-27 DIAGNOSIS — Z9861 Coronary angioplasty status: Secondary | ICD-10-CM | POA: Diagnosis not present

## 2023-01-27 DIAGNOSIS — Z7901 Long term (current) use of anticoagulants: Secondary | ICD-10-CM | POA: Insufficient documentation

## 2023-01-27 DIAGNOSIS — Z951 Presence of aortocoronary bypass graft: Secondary | ICD-10-CM | POA: Diagnosis not present

## 2023-01-27 DIAGNOSIS — I2510 Atherosclerotic heart disease of native coronary artery without angina pectoris: Secondary | ICD-10-CM | POA: Diagnosis not present

## 2023-01-27 DIAGNOSIS — G4733 Obstructive sleep apnea (adult) (pediatric): Secondary | ICD-10-CM | POA: Diagnosis not present

## 2023-01-27 DIAGNOSIS — G4730 Sleep apnea, unspecified: Secondary | ICD-10-CM | POA: Diagnosis not present

## 2023-01-27 DIAGNOSIS — E785 Hyperlipidemia, unspecified: Secondary | ICD-10-CM | POA: Diagnosis not present

## 2023-01-27 DIAGNOSIS — I130 Hypertensive heart and chronic kidney disease with heart failure and stage 1 through stage 4 chronic kidney disease, or unspecified chronic kidney disease: Secondary | ICD-10-CM | POA: Diagnosis not present

## 2023-01-27 DIAGNOSIS — I5043 Acute on chronic combined systolic (congestive) and diastolic (congestive) heart failure: Secondary | ICD-10-CM | POA: Insufficient documentation

## 2023-01-27 DIAGNOSIS — I10 Essential (primary) hypertension: Secondary | ICD-10-CM | POA: Diagnosis not present

## 2023-01-27 DIAGNOSIS — J4489 Other specified chronic obstructive pulmonary disease: Secondary | ICD-10-CM | POA: Diagnosis not present

## 2023-01-27 DIAGNOSIS — N189 Chronic kidney disease, unspecified: Secondary | ICD-10-CM | POA: Insufficient documentation

## 2023-01-27 DIAGNOSIS — Z85820 Personal history of malignant melanoma of skin: Secondary | ICD-10-CM | POA: Insufficient documentation

## 2023-01-27 DIAGNOSIS — I5022 Chronic systolic (congestive) heart failure: Secondary | ICD-10-CM | POA: Diagnosis not present

## 2023-01-27 DIAGNOSIS — J9611 Chronic respiratory failure with hypoxia: Secondary | ICD-10-CM | POA: Diagnosis not present

## 2023-01-27 NOTE — Progress Notes (Signed)
Patient ID: Taylor [name], male    DOB: 01-Jul-1941, 82 y.o.   MRN: [id number]  HPI  Taylor [name] is [name] 82 y/o male with [name] history of AAA, bladder cancer, DM, atrial fibrillation, CAD, hyperlipidemia, HTN, stroke, GERD, melanoma, pancreatitis, sleep apnea, VF and chronic heart failure.   Echo report from 10/30/22 showed an EF of <20% along with mild LVH, severe LAE, severe Taylor, mild AR and moderate/severe TR.   Stress test 05/07/21: Moderate to severe LV systolic dysfunction  Large perfusion abnormality of severe intensity in the anterior apical  myocardial perfusion distribution consistent with previous infarct and/or  scar without evidence of myocardial ischemia   RHC/LHC done 10/02/12: Left Main:  normal       LAD system:  significant       LCX system:  significant       RCA system:  normal       Number of vessels with significant CAD:  2 - Vessel   Left Ventriculogram       Ejection Fraction:   65%   Admitted 10/30/22 due to DOE and orthopnea x 7 days. Chest xray showed cardiomegaly and central vascular congestion. Cardiology & nephrology consults obtained. Given IV lasix. Cr worsened with diuretics so discontinued and put on gentle fluids in setting of HF. Placed on oxygen. Discharged after 3 days. Was in the ED 10/27/22 due to nausea epigastric discomfort some shortness of breath;   He presents today for [name] HF follow-up visit with [name] chief complaint of moderate SOB with little exertion. Describes this as chronic although he does feel like it has been worsening over the last several weeks. He has associated fatigue, productive cough, pedal edema, light-headedness and weight gain along with this. Denies any difficulty sleeping, abdominal distention, palpitations, chest pain or wheezing.   Sees pulmonology later today and is having pacemaker battery change out done on 02/13/23  Past Medical History:  Diagnosis Date   AAA (abdominal aortic aneurysm) ([place]) 06/03/2007   [facility]; Dr. Kellie Simmering   AICD (automatic cardioverter/defibrillator) present    Arrhythmia    atrial fibrillation   Barrett's esophagus    Bladder cancer ([place])    Bradycardia    CAD (coronary artery disease)    CAP (community acquired pneumonia) 11/13/2019   CHF (congestive heart failure) ([place])    Cluster headache    COVID-19 09/27/2021   DDD (degenerative disc disease), lumbar    Diabetes mellitus without complication ([place])    Dyspnea    WITH EXERTION   Edema    LEFT ANKLE   Fracture of skull base ([place]) 1997   due to fall   GERD (gastroesophageal reflux disease)    Gout    History of bladder cancer 12/1995   Hyperlipidemia    Hypertension    Hypocalcemia 04/19/2020   Possibly secondary to diuretics.   Low=5.9 04/19/2020   Malignant melanoma ([place]) 12/2012   right dorsal forearm excised   Myocardial infarction [facility])    LAST 2014   Osteoarthritis of knee    Other specified complication of vascular prosthetic devices, implants and grafts, initial encounter ([place]) 08/22/2021   Pacemaker 10/10/2006   Pancreatitis 11/22/2019   Pneumonia    2016   Pre-diabetes    Psoriasis    Rib fracture 1997   due to fall   Sleep apnea    CPAP   Stroke ([place])    Venous incompetence    Past Surgical History:  Procedure  Laterality Date   ABDOMINAL AORTIC ANEURYSM REPAIR  06/03/2007   [facility]; Dr. Kellie Simmering   ANGIOPLASTY  1994   MI   BLADDER TUMOR EXCISION  12/1995   CAROTID PTA/STENT INTERVENTION Left 07/23/2022   Procedure: CAROTID PTA/STENT INTERVENTION;  Surgeon: Taylor Cabal, Taylor [name];  Location: [place] CV LAB;  Service: Cardiovascular;  Laterality: Left;   CATARACT EXTRACTION W/PHACO Left 10/22/2016   Procedure: CATARACT EXTRACTION PHACO AND INTRAOCULAR LENS PLACEMENT (IOC);  Surgeon: Taylor Robson, Taylor [name];  Location: [facility] ORS;  Service: Ophthalmology;  Laterality: Left;  [place] 47.7 AP% 18.4 CDE 8.78 Fluid pack lot # [id number]   CATARACT EXTRACTION W/PHACO Right  12/10/2016   Procedure: CATARACT EXTRACTION PHACO AND INTRAOCULAR LENS PLACEMENT (IOC);  Surgeon: Taylor Robson, Taylor [name];  Location: [facility] ORS;  Service: Ophthalmology;  Laterality: Right;  [place] 00:39 AP% 23.3 CDE 9.13 Fluid pack lot # [id number]   CORONARY ANGIOPLASTY     STENTS X 5   CORONARY ARTERY BYPASS GRAFT  09/22/2006   four   ELBOW BURSA SURGERY     DUE TO GOUT   INSERT / REPLACE / REMOVE PACEMAKER     MELANOMA EXCISION  12/2012   Right forearm   Family History  Problem Relation Age of Onset   Cancer Mother        Melanoma skin cancer   Heart attack Father 39   Cancer Father        throat cancer   Arthritis Brother    Social History   Tobacco Use   Smoking status: Former    Packs/day: 1.00    Types: Cigarettes    Quit date: 11/26/1999    Years since quitting: 23.1   Smokeless tobacco: Never  Substance Use Topics   Alcohol use: No   Allergies  Allergen Reactions   Amlodipine Besylate Swelling    Had [name] reaction when taking with colcrys    Crestor [Rosuvastatin]     Muscle cramps and pain   Rocephin [Ceftriaxone]     unknown   Prior to Admission medications   Medication Sig Start Date End Date Taking? Authorizing Taylor [name]  albuterol (VENTOLIN HFA) 108 (90 Base) MCG/ACT inhaler Inhale 2 puffs into the lungs every 6 (six) hours as needed for wheezing or shortness of breath. 01/03/23  Yes Taylor Sons, Taylor [name]  allopurinol (ZYLOPRIM) 300 MG tablet Take 0.5 tablets (150 mg total) by mouth daily. 09/25/22  Yes Taylor Sons, Taylor [name]  apixaban (ELIQUIS) 2.5 MG TABS tablet Take 2 tablets (5 mg total) by mouth 2 (two) times daily. 11/02/22 01/31/23 Yes Taylor Osmond, Taylor [name]  aspirin 81 MG EC tablet Take 81 mg by mouth daily.    Yes Taylor [name], Historical, Taylor [name]  atorvastatin (LIPITOR) 40 MG tablet TAKE 1 TABLET(40 MG) BY MOUTH DAILY 12/30/22  Yes Taylor Sons, Taylor [name]  Calcium Carbonate Antacid 600 MG chewable tablet Chew 1 tablet by mouth daily.   Yes Taylor [name], Historical, Taylor [name]  carvedilol  (COREG) 12.5 MG tablet Take 12.5 mg by mouth 2 (two) times daily with [name] meal. Daily   Yes Taylor [name], Historical, Taylor [name]  isosorbide mononitrate (IMDUR) 30 MG 24 hr tablet TAKE 1/2 TABLET(15 MG) BY MOUTH DAILY Patient taking differently: 30 mg daily. 11/28/22  Yes Taylor Sons, Taylor [name]  Magnesium 200 MG TABS Take 1 tablet (200 mg total) by mouth in the morning and at bedtime. 11/11/22  Yes Taylor [name] [name], Taylor [name]  pantoprazole (PROTONIX) 40 MG tablet TAKE 1 TABLET BY MOUTH  EVERY DAY 11/21/22  Yes Taylor Sons, Taylor [name]  potassium chloride SA (KLOR-CON M) 20 MEQ tablet Take 20 mEq by mouth daily.   Yes Taylor [name], Historical, Taylor [name]  torsemide (DEMADEX) 20 MG tablet Take 40 mg by mouth daily. 09/04/20  Yes Taylor [name], Historical, Taylor [name]  triamcinolone ointment (KENALOG) 0.1 % APPLY TOPICALLY TWICE DAILY AS DIRECTED 07/15/22  Yes Taylor Sons, Taylor [name]  glucose blood (ONETOUCH VERIO) test strip Use as instructed to check sugar daily Patient not taking: Reported on 01/27/2023 12/14/19   Taylor Sons, Taylor [name]    Review of Systems  Constitutional:  Positive for fatigue. Negative for appetite change.  HENT:  Positive for congestion and hearing loss. Negative for rhinorrhea and sore throat.   Eyes: Negative.   Respiratory:  Positive for cough and shortness of breath (easily & worsening). Negative for chest tightness and wheezing.   Cardiovascular:  Positive for leg swelling. Negative for chest pain and palpitations.  Gastrointestinal:  Negative for abdominal distention and abdominal pain.  Endocrine: Negative.   Genitourinary: Negative.   Musculoskeletal:  Negative for back pain and neck pain.  Skin: Negative.   Allergic/Immunologic: Negative.   Neurological:  Positive for light-headedness. Negative for dizziness.  Hematological:  Negative for adenopathy. Bruises/bleeds easily.  Psychiatric/Behavioral:  Negative for dysphoric mood and sleep disturbance (sleeping on 1 pillow; wearing oxygen at 5L). The patient is not  nervous/anxious.    Vitals:   01/27/23 1037  BP: (!) 127/93  Pulse: 61  Resp: 16  SpO2: 93%  Weight: 202 lb 2 oz (91.7 kg)   Wt Readings from Last 3 Encounters:  01/27/23 202 lb 2 oz (91.7 kg)  01/03/23 197 lb (89.4 kg)  12/24/22 195 lb (88.5 kg)   Lab Results  Component Value Date   CREATININE 3.64 (H) 01/03/2023   CREATININE 2.72 (H) 12/24/2022   CREATININE 2.77 (H) 11/27/2022   Physical Exam Vitals and nursing note reviewed. Exam conducted with [name] chaperone present (wife).  Constitutional:      Appearance: Normal appearance.  HENT:     Head: Normocephalic and atraumatic.     Right Ear: Decreased hearing noted.     Left Ear: Decreased hearing noted.  Cardiovascular:     Rate and Rhythm: Normal rate and regular rhythm.  Pulmonary:     Effort: Pulmonary effort is normal.     Breath sounds: No wheezing, rhonchi or rales.  Abdominal:     General: There is distension.     Palpations: Abdomen is soft.     Tenderness: There is no abdominal tenderness.  Musculoskeletal:        General: No tenderness.     Cervical back: Normal range of motion and neck supple.     Right lower leg: No tenderness. Edema (2+ pitting) present.     Left lower leg: No tenderness. Edema (2+ pitting) present.  Skin:    General: Skin is warm and dry.  Neurological:     General: No focal deficit present.     Mental Status: He is alert and oriented to person, place, and time.  Psychiatric:        Mood and Affect: Mood normal.        Behavior: Behavior normal.   Assessment & Plan:  1: Acute on Chronic heart failure with reduced ejection fraction- - NYHA class III - fluid overloaded with worsening symptoms, increased weight, pedal edema and elevated ReDs reading - weighing daily; weight chart reviewed and shows weight gain of 18  pounds over the last couple of months; reminded to call for an overnight weight gain of > 2 pounds or [name] weekly weight gain of > 5 pounds - weight up 12 pounds from last  visit here 2 months ago - ReDs clip reading elevated at 50%; previous reading was 47% - increase torsemide to '40mg'$  BID for the next 3 days; will defer potassium supplementation since K+ last was 4.7; sees nephrology tomorrow so they can f/u on symptoms - advised to get compression socks and put them on every morning with removal at bedtime - echo 10/30/22 showed an EF of <20% along with mild LVH, severe LAE, severe Taylor, mild AR and moderate/severe TR - Stress test 05/07/21 showed moderate to severe LV systolic dysfunction, large perfusion abnormality of severe intensity in the anterior apical myocardial perfusion distribution consistent with previous infarct and/or scar without evidence of myocardial ischemia  - RHC/LHC done 10/02/12:      Left Main:  normal       LAD system:  significant       LCX system:  significant       RCA system:  normal       Left Ventriculogram Ejection Fraction:   65%  - not adding salt to his food - carvedilol 12.'5mg'$  BID - torsemide '40mg'$  daily (increasing per above) - should renal function remain stable, consider adding SGLT2 - consider ADHF referral if symptoms continue - has AICD; Mg 12/24/22 was 1.8 - BNP 12/24/22 was 995.3  2: HTN- - BP 127/93 - saw PCP Taylor [name]) 01/03/23 - BMP 01/22/23 reviewed and showed sodium 143, potassium 4.7, creatinine 2.47 and GFR 26 - saw nephrology (Taylor [name]) 12/31/22; returns 01/28/23  3: Atrial fibrillation- - saw cardiology Albertine Patricia) 01/13/23 - apixaban '5mg'$  BID - has pacemaker with OptiVol capacity for fluid monitoring; due to have battery change 02/13/23  4: CAD- - CABG 2007 - atorvastatin '40mg'$  daily  5: DM with CKD-  - A1c 12/24/22 was 6.5%  6: Sleep apnea- - wearing CPAP nightly - seeing pulmonology Lanney Gins) later today - wearing oxygen at 2L when out, 5 liters at home   Medication list reviewed.   Return in 3 months, sooner if needed.

## 2023-01-27 NOTE — Progress Notes (Signed)
ReDS Vest / Clip - 01/27/23 1000       ReDS Vest / Clip   Station Marker C    Ruler Value 27    ReDS Value Range High volume overload    ReDS Actual Value 50

## 2023-01-27 NOTE — Patient Instructions (Addendum)
Get compression socks and put them on every morning with removal at bedtime.    Increase your torsemide to 2 tablets in the morning and 2 tablets in the evening for the next 3 days. Follow-up with the kidney doctor tomorrow.    If the swelling and weight do not start to decline, please let us know.

## 2023-01-28 DIAGNOSIS — U071 COVID-19: Secondary | ICD-10-CM | POA: Diagnosis not present

## 2023-01-28 DIAGNOSIS — M1000 Idiopathic gout, unspecified site: Secondary | ICD-10-CM | POA: Diagnosis not present

## 2023-01-28 DIAGNOSIS — L408 Other psoriasis: Secondary | ICD-10-CM | POA: Diagnosis not present

## 2023-01-28 DIAGNOSIS — Z95810 Presence of automatic (implantable) cardiac defibrillator: Secondary | ICD-10-CM | POA: Diagnosis not present

## 2023-01-28 DIAGNOSIS — E1122 Type 2 diabetes mellitus with diabetic chronic kidney disease: Secondary | ICD-10-CM | POA: Diagnosis not present

## 2023-01-28 DIAGNOSIS — I25119 Atherosclerotic heart disease of native coronary artery with unspecified angina pectoris: Secondary | ICD-10-CM | POA: Diagnosis not present

## 2023-01-28 DIAGNOSIS — E7800 Pure hypercholesterolemia, unspecified: Secondary | ICD-10-CM | POA: Diagnosis not present

## 2023-01-28 DIAGNOSIS — N261 Atrophy of kidney (terminal): Secondary | ICD-10-CM | POA: Diagnosis not present

## 2023-01-28 DIAGNOSIS — I10 Essential (primary) hypertension: Secondary | ICD-10-CM | POA: Diagnosis not present

## 2023-01-28 DIAGNOSIS — I509 Heart failure, unspecified: Secondary | ICD-10-CM | POA: Diagnosis not present

## 2023-01-28 DIAGNOSIS — T82898A Other specified complication of vascular prosthetic devices, implants and grafts, initial encounter: Secondary | ICD-10-CM | POA: Diagnosis not present

## 2023-01-28 DIAGNOSIS — N184 Chronic kidney disease, stage 4 (severe): Secondary | ICD-10-CM | POA: Diagnosis not present

## 2023-01-29 ENCOUNTER — Ambulatory Visit: Payer: Self-pay | Admitting: *Deleted

## 2023-01-29 DIAGNOSIS — N184 Chronic kidney disease, stage 4 (severe): Secondary | ICD-10-CM | POA: Diagnosis not present

## 2023-01-29 DIAGNOSIS — I25119 Atherosclerotic heart disease of native coronary artery with unspecified angina pectoris: Secondary | ICD-10-CM | POA: Diagnosis not present

## 2023-01-29 DIAGNOSIS — I5023 Acute on chronic systolic (congestive) heart failure: Secondary | ICD-10-CM | POA: Diagnosis not present

## 2023-01-29 DIAGNOSIS — I5022 Chronic systolic (congestive) heart failure: Secondary | ICD-10-CM | POA: Diagnosis not present

## 2023-01-29 DIAGNOSIS — I340 Nonrheumatic mitral (valve) insufficiency: Secondary | ICD-10-CM | POA: Diagnosis not present

## 2023-01-29 NOTE — Patient Outreach (Signed)
  Care Coordination   Follow Up Visit Note   01/29/2023 Name: Taylor [name] MRN: [id number] DOB: [birth year]  Taylor [name] is a 82 y.o. year old male who sees [name], Taylor Peri, MD for primary care. I spoke with  Taylor [name] by phone today.  What matters to the patients health and wellness today?  Remains short of breath, state it is no worse than previously.  He is aware of current plan of care, wife supportive.     Goals Addressed             This Visit's Progress    Develop Plan of care for Management of CHF   Not on track    Care Coordination Interventions: Basic overview and discussion of pathophysiology of Heart Failure reviewed Provided education on low sodium diet Reviewed Heart Failure Action Plan in depth and provided written copy Assessed need for readable accurate scales in home Advised patient to weigh each morning after emptying bladder Discussed importance of daily weight and advised patient to weigh and record daily Reviewed role of diuretics in prevention of fluid overload and management of heart failure; Discussed deep breathing with use of oxygen.  State he has had to increase oxygen level up to 5 liters, still having shortness of breath.  Oxygen saturations 94% currently, as low as 84% over the last week Follow up appointments completed with pulmonary, cardiology, and heart failure clinic, will have appointment with [place] heart specialist to discuss LVAD in the next week.  Will have pacemaker placed on 3/21, PCP is not until June for AWV Advised to be seen at ED if breathing changes or has chest pain         SDOH assessments and interventions completed:  No    Interventions Today    Flowsheet Row Most Recent Value  Chronic Disease   Chronic disease during today's visit Congestive Heart Failure (CHF)  General Interventions   General Interventions Discussed/Reviewed General Interventions Reviewed, Doctor Visits  Doctor Visits Discussed/Reviewed Annual  Wellness Visits, Doctor Visits Reviewed, Specialist  PCP/Specialist Visits Compliance with follow-up visit  Contacted provider for referral to PCP, Specialist        Care Coordination Interventions:  Yes, provided   Follow up plan: Follow up call scheduled for 3/22    Encounter Outcome:  Pt. Visit Completed   Taylor David, RN, MSN, [facility] Care Management Coordinator [phone]

## 2023-01-30 ENCOUNTER — Institutional Professional Consult (permissible substitution): Payer: Medicare Other | Admitting: Student in an Organized Health Care Education/Training Program

## 2023-01-30 DIAGNOSIS — J4489 Other specified chronic obstructive pulmonary disease: Secondary | ICD-10-CM | POA: Diagnosis not present

## 2023-02-01 DIAGNOSIS — J9621 Acute and chronic respiratory failure with hypoxia: Secondary | ICD-10-CM | POA: Diagnosis not present

## 2023-02-03 DIAGNOSIS — J9621 Acute and chronic respiratory failure with hypoxia: Secondary | ICD-10-CM | POA: Diagnosis not present

## 2023-02-13 ENCOUNTER — Encounter: Payer: Self-pay | Admitting: Cardiology

## 2023-02-13 ENCOUNTER — Ambulatory Visit
Admission: RE | Admit: 2023-02-13 | Discharge: 2023-02-13 | Disposition: A | Discharge status: Home / Self Care | Payer: Medicare Other | Source: Ambulatory Visit | Attending: Cardiology | Admitting: Cardiology

## 2023-02-13 ENCOUNTER — Other Ambulatory Visit: Payer: Self-pay

## 2023-02-13 ENCOUNTER — Encounter
Admission: RE | Disposition: A | Discharge status: Home / Self Care | Payer: Self-pay | Source: Ambulatory Visit | Attending: Cardiology

## 2023-02-13 DIAGNOSIS — Z4502 Encounter for adjustment and management of automatic implantable cardiac defibrillator: Secondary | ICD-10-CM | POA: Insufficient documentation

## 2023-02-13 DIAGNOSIS — I5022 Chronic systolic (congestive) heart failure: Secondary | ICD-10-CM | POA: Diagnosis not present

## 2023-02-13 DIAGNOSIS — Z45010 Encounter for checking and testing of cardiac pacemaker pulse generator [battery]: Secondary | ICD-10-CM

## 2023-02-13 HISTORY — PX: PPM GENERATOR CHANGEOUT: EP1233

## 2023-02-13 LAB — GLUCOSE, CAPILLARY: Glucose-Capillary: 101 mg/dL — ABNORMAL HIGH (ref 70–99)

## 2023-02-13 SURGERY — PPM GENERATOR CHANGEOUT
Anesthesia: Moderate Sedation

## 2023-02-13 MED ORDER — FENTANYL CITRATE (PF) 100 MCG/2ML IJ SOLN
INTRAMUSCULAR | Status: DC | PRN
  Administered 2023-02-13: 25 ug via INTRAVENOUS

## 2023-02-13 MED ORDER — VANCOMYCIN HCL IN DEXTROSE 1-5 GM/200ML-% IV SOLN
1000.0000 mg | INTRAVENOUS | Status: AC
  Administered 2023-02-13: 1000 mg via INTRAVENOUS

## 2023-02-13 MED ORDER — FENTANYL CITRATE (PF) 100 MCG/2ML IJ SOLN
INTRAMUSCULAR | Status: AC
  Filled 2023-02-13: qty 2

## 2023-02-13 MED ORDER — SODIUM CHLORIDE 0.9 % IV SOLN: INTRAVENOUS | Status: DC

## 2023-02-13 MED ORDER — VANCOMYCIN HCL IN DEXTROSE 1-5 GM/200ML-% IV SOLN
INTRAVENOUS | Status: AC
  Filled 2023-02-13: qty 200

## 2023-02-13 MED ORDER — SODIUM CHLORIDE 0.9 % IV SOLN
80.0000 mg | INTRAVENOUS | Status: AC
  Administered 2023-02-13: 80 mg
  Filled 2023-02-13: qty 2

## 2023-02-13 MED ORDER — CLARITHROMYCIN ER 500 MG PO TB24: 1000.0000 mg | ORAL_TABLET | Freq: Every day | ORAL | 0 refills | Status: AC

## 2023-02-13 MED ORDER — LIDOCAINE HCL 1 % IJ SOLN
INTRAMUSCULAR | Status: AC
  Filled 2023-02-13: qty 60

## 2023-02-13 MED ORDER — ONDANSETRON HCL 4 MG/2ML IJ SOLN: 4.0000 mg | Freq: Four times a day (QID) | INTRAMUSCULAR | Status: DC | PRN

## 2023-02-13 MED ORDER — MIDAZOLAM HCL 2 MG/2ML IJ SOLN
INTRAMUSCULAR | Status: AC
  Filled 2023-02-13: qty 2

## 2023-02-13 MED ORDER — MIDAZOLAM HCL 2 MG/2ML IJ SOLN
INTRAMUSCULAR | Status: DC | PRN
  Administered 2023-02-13: 1 mg via INTRAVENOUS

## 2023-02-13 MED ORDER — HEPARIN (PORCINE) IN NACL 1000-0.9 UT/500ML-% IV SOLN: INTRAVENOUS | Status: DC | PRN

## 2023-02-13 MED ORDER — LIDOCAINE HCL (PF) 1 % IJ SOLN
INTRAMUSCULAR | Status: DC | PRN
  Administered 2023-02-13: 30 mL via SUBCUTANEOUS

## 2023-02-13 MED ORDER — ACETAMINOPHEN 325 MG PO TABS: 325.0000 mg | ORAL_TABLET | ORAL | Status: DC | PRN

## 2023-02-13 MED ORDER — BENZONATATE 100 MG PO CAPS
200.0000 mg | ORAL_CAPSULE | Freq: Three times a day (TID) | ORAL | Status: DC | PRN
  Administered 2023-02-13: 200 mg via ORAL
  Filled 2023-02-13 (×2): qty 2

## 2023-02-13 SURGICAL SUPPLY — 17 items
CABLE SURG 12 DISP A/V CHANNEL (MISCELLANEOUS) IMPLANT
DEVICE DSSCT PLSMBLD 3.0S LGHT (MISCELLANEOUS) IMPLANT
DRAPE INCISE 23X17 STRL (DRAPES) IMPLANT
DRAPE INCISE IOBAN 23X17 STRL (DRAPES) ×1 IMPLANT
ELECT REM PT RETURN 9FT ADLT (ELECTROSURGICAL) ×1
ELECTRODE REM PT RTRN 9FT ADLT (ELECTROSURGICAL) IMPLANT
HEMOSTAT SURGICEL 2X4 FIBR (HEMOSTASIS) IMPLANT
ICD COBALT XT DR DDPA2D4 (ICD Generator) IMPLANT
PAD ELECT DEFIB RADIOL ZOLL (MISCELLANEOUS) IMPLANT
PLASMABLADE 3.0S W/LIGHT (MISCELLANEOUS) ×1
POUCH AIGIS-R ANTIBACT ICD (Mesh General) ×1 IMPLANT
POUCH AIGIS-R ANTIBACT ICD LRG (Mesh General) IMPLANT
SUT VIC AB 2-0 CT2 27 (SUTURE) IMPLANT
SUT VICRYL 4-0  27 PS-2 BARIAT (SUTURE) ×1
SUT VICRYL 4-0 27 PS-2 BARIAT (SUTURE) ×1
SUTURE VICRYL 4-0 27 PS-2 BART (SUTURE) IMPLANT
TRAY PACEMAKER INSERTION (PACKS) ×1 IMPLANT

## 2023-02-13 NOTE — Discharge Instructions (Signed)
Hold Eliquis 02/13/2023, resume p.m. dose on 02/14/2023.  Patient may shower 02/15/2023, may remove outer bandage, leave Steri-Strips on.

## 2023-02-14 ENCOUNTER — Ambulatory Visit: Payer: Self-pay

## 2023-02-14 ENCOUNTER — Encounter: Payer: Self-pay | Admitting: Cardiology

## 2023-02-14 ENCOUNTER — Ambulatory Visit: Payer: Self-pay | Admitting: *Deleted

## 2023-02-14 MED ORDER — HYDROCODONE-ACETAMINOPHEN 5-325 MG PO TABS: 1.0000 | ORAL_TABLET | Freq: Three times a day (TID) | ORAL | 0 refills | Status: AC | PRN

## 2023-02-14 NOTE — Addendum Note (Signed)
Addended by: Birdie Sons on: 02/14/2023 05:21 PM   Modules accepted: Orders

## 2023-02-14 NOTE — Patient Instructions (Signed)
Visit Information  Thank you for taking time to visit with me today. Please don't hesitate to contact me if I can be of assistance to you before our next scheduled telephone appointment.  Following are the goals we discussed today:  Take Tylenol for pain control. Follow up with pharmacy this afternoon about antibiotic.   Our next appointment is by telephone on 3/25  Please call the care guide team at [phone] if you need to cancel or reschedule your appointment.   Please call the Suicide and Crisis Lifeline: 988 call the [place] National Suicide Prevention Lifeline: (682)799-4088 or TTY: 360-618-0726 TTY (224)365-4264) to talk to a trained counselor call 1-800-273-TALK (toll free, 24 hour hotline) call 911 if you are experiencing a Mental Health or [place] or need someone to talk to.  Patient verbalizes understanding of instructions and care plan provided today and agrees to view in [place]. Active MyChart status and patient understanding of how to access instructions and care plan via MyChart confirmed with patient.     The patient has been provided with contact information for the care management team and has been advised to call with any health related questions or concerns.   Valente David, RN, MSN, [place] Care Management Care Management Coordinator [phone]

## 2023-02-14 NOTE — Telephone Encounter (Signed)
No, he should stop atorvastatin, but he could take one every other day instead of 1/2 every day while on antibiotic. Have sent prescription hydrocodone/apap to his pharmacy for pain.

## 2023-02-14 NOTE — Patient Outreach (Signed)
  Care Coordination   Follow Up Visit Note   02/14/2023 Name: Taylor [name] MRN: [id number] DOB: 09/09/1941  Taylor [name] is a 82 y.o. year old male who sees [name], [name], MD for primary care. I spoke with  Taylor [name] by phone today.  What matters to the patients health and wellness today?  Had  pacer placed yesterday, not able to pick up antibiotic prescription due to error in how it was written.     Goals Addressed             This Visit's Progress    Develop Plan of care for Management of CHF   On track    Care Coordination Interventions: Basic overview and discussion of pathophysiology of Heart Failure reviewed Provided education on low sodium diet Reviewed Heart Failure Action Plan in depth and provided written copy Assessed need for readable accurate scales in home Advised patient to weigh each morning after emptying bladder Discussed importance of daily weight and advised patient to weigh and record daily Reviewed role of diuretics in prevention of fluid overload and management of heart failure; Discussed deep breathing with use of oxygen.  State he has been able to reduce oxygen use and saturations have remained greater then 94% Follow up appointments:  3/25 for Echo, will have [facility] heart follow up after results are complete.  Cardiology on 4/1 for pacer follow up, nephrology on 4/2, and HF clinic on 6/4.  PCP is not until June for AWV Collaborated with pharmacy and cardiology office to correct antibiotic prescription Discussed pain control post pacer placement, using Tylenol for pain control.         SDOH assessments and interventions completed:  No     Care Coordination Interventions:  Yes, provided   Interventions Today    Flowsheet Row Most Recent Value  Chronic Disease   Chronic disease during today's visit Congestive Heart Failure (CHF)  General Interventions   General Interventions Discussed/Reviewed General Interventions Reviewed, Doctor Visits,  Communication with  Doctor Visits Discussed/Reviewed Doctor Visits Reviewed, PCP, Specialist  PCP/Specialist Visits Compliance with follow-up visit  Communication with Pharmacists, PCP/Specialists  Education Interventions   Education Provided Provided Education  Provided Verbal Education On When to see the doctor, Other  [post pacer]        Follow up plan: Follow up call scheduled for 3/25    Encounter Outcome:  Pt. Visit Completed   Valente David, RN, MSN, [place] Care Management Care Management Coordinator [phone]

## 2023-02-14 NOTE — Telephone Encounter (Signed)
  Chief Complaint: Medication question and request Symptoms: pain and question Frequency: Pain since yesterday Pertinent Negatives: Patient denies  Disposition: [] ED /[] Urgent Care (no appt availability in office) / [] Appointment(In office/virtual)/ []  [facility]/ [] Home Care/ [] Refused Recommended Disposition /[] [facility]/ [x]  Follow-up with PCP Additional Notes: PT was told to take a half dose of atorvastatin while taking the antibiotic. Pt would like to know if it is ok to stop atorvastatin while taking ABX. Additionally pt states that his shoulder is very painful from pacemaker implant placed yesterday. Pt would like pain medication called in. Pt has contacted surgeon for pain medication. Surgeon was unable to prescribe as it was after hours.     Summary: Drug interference between Abx and atorvastatin   Pt called reporting that he has been prescribed an antibiotic, he says that he has been instructed to contact his PCP bc he cannot take his antibiotic and his atorvastatin at the same time.  He had a new pacemaker installed yesterday, that's why he has the abx.  Best contact: [phone]         Reason for Disposition  [1] Caller has URGENT medicine question about med that PCP or specialist prescribed AND [2] triager unable to answer question  Answer Assessment - Initial Assessment Questions 1. NAME of MEDICINE: "What medicine(s) are you calling about?"     Atorvastatin and abx 2. QUESTION: "What is your question?" (e.g., double dose of medicine, side effect)     PT was told to take a half dose of atorvastatin while taking the antibiotic. Pt would like to know if it is ok to stop atorvastatin while taking ABX. 3. PRESCRIBER: "Who prescribed the medicine?" Reason: if prescribed by specialist, call should be referred to that group.      4. SYMPTOMS: "Do you have any symptoms?" If Yes, ask: "What symptoms are you having?"  "How bad are the symptoms (e.g.,  mild, moderate, severe)  Protocols used: Medication Question Call-A-AH

## 2023-02-17 ENCOUNTER — Ambulatory Visit: Payer: Self-pay | Admitting: *Deleted

## 2023-02-17 DIAGNOSIS — I083 Combined rheumatic disorders of mitral, aortic and tricuspid valves: Secondary | ICD-10-CM | POA: Diagnosis not present

## 2023-02-17 DIAGNOSIS — I340 Nonrheumatic mitral (valve) insufficiency: Secondary | ICD-10-CM | POA: Diagnosis not present

## 2023-02-17 NOTE — Telephone Encounter (Signed)
Advised 

## 2023-02-17 NOTE — Patient Outreach (Signed)
  Care Coordination   Follow Up Visit Note   02/17/2023 Name: Taylor [name] MRN: [id number] DOB: 1940-12-17  Taylor [name] is a 82 y.o. year old male who sees [name], [name], MD for primary care. I spoke with  [name] by phone today.  What matters to the patients health and wellness today?  Antibiotics obtained, on the way for [facility] at [facility] today.    Goals Addressed             This Visit's Progress    Develop Plan of care for Management of CHF   On track    Care Coordination Interventions: Basic overview and discussion of pathophysiology of Heart Failure reviewed Provided education on low sodium diet Reviewed Heart Failure Action Plan in depth and provided written copy Assessed need for readable accurate scales in home Advised patient to weigh each morning after emptying bladder Discussed importance of daily weight and advised patient to weigh and record daily Reviewed role of diuretics in prevention of fluid overload and management of heart failure; Discussed deep breathing with use of oxygen.  State he has been able to reduce oxygen use and saturations have remained greater then 94% Follow up appointments:  3/25 for Echo, will have [facility] follow up after results are complete.  Cardiology on 4/1 for pacer follow up, nephrology on 4/2, and HF clinic on 6/4.  PCP is not until June for [place] with pharmacy and cardiology office to correct antibiotic prescription - Update 3/25 - Obtained antibiotics and using as instructed Discussed pain control post pacer placement, using Tylenol for pain control.         SDOH assessments and interventions completed:  No     Care Coordination Interventions:  Yes, provided   Follow up plan: Follow up call scheduled for 4/24    Encounter Outcome:  Pt. Visit Completed   Valente David, RN, MSN, [place] Care Management Care Management Coordinator [phone]

## 2023-02-18 ENCOUNTER — Encounter: Payer: Self-pay | Admitting: Cardiology

## 2023-02-20 DIAGNOSIS — H02132 Senile ectropion of right lower eyelid: Secondary | ICD-10-CM | POA: Diagnosis not present

## 2023-02-20 DIAGNOSIS — H02103 Unspecified ectropion of right eye, unspecified eyelid: Secondary | ICD-10-CM | POA: Diagnosis not present

## 2023-02-20 DIAGNOSIS — H04223 Epiphora due to insufficient drainage, bilateral lacrimal glands: Secondary | ICD-10-CM | POA: Diagnosis not present

## 2023-02-24 ENCOUNTER — Ambulatory Visit (INDEPENDENT_AMBULATORY_CARE_PROVIDER_SITE_OTHER): Payer: Medicare Other | Admitting: Family Medicine

## 2023-02-24 ENCOUNTER — Ambulatory Visit
Admission: RE | Admit: 2023-02-24 | Discharge: 2023-02-24 | Disposition: A | Discharge status: Home / Self Care | Payer: Medicare Other | Source: Ambulatory Visit | Attending: Family Medicine | Admitting: Family Medicine

## 2023-02-24 ENCOUNTER — Ambulatory Visit
Admission: RE | Admit: 2023-02-24 | Discharge: 2023-02-24 | Disposition: A | Discharge status: Home / Self Care | Payer: Medicare Other | Source: Home / Self Care | Attending: Family Medicine | Admitting: Family Medicine

## 2023-02-24 VITALS — BP 120/78 | HR 69 | Temp 97.3°F | Wt 197.0 lb

## 2023-02-24 DIAGNOSIS — I255 Ischemic cardiomyopathy: Secondary | ICD-10-CM | POA: Diagnosis not present

## 2023-02-24 DIAGNOSIS — R188 Other ascites: Secondary | ICD-10-CM | POA: Diagnosis not present

## 2023-02-24 DIAGNOSIS — E1122 Type 2 diabetes mellitus with diabetic chronic kidney disease: Secondary | ICD-10-CM | POA: Diagnosis not present

## 2023-02-24 DIAGNOSIS — I5022 Chronic systolic (congestive) heart failure: Secondary | ICD-10-CM | POA: Diagnosis not present

## 2023-02-24 DIAGNOSIS — I10 Essential (primary) hypertension: Secondary | ICD-10-CM | POA: Diagnosis not present

## 2023-02-24 DIAGNOSIS — L03032 Cellulitis of left toe: Secondary | ICD-10-CM | POA: Diagnosis not present

## 2023-02-24 DIAGNOSIS — R0602 Shortness of breath: Secondary | ICD-10-CM | POA: Insufficient documentation

## 2023-02-24 DIAGNOSIS — Z1152 Encounter for screening for COVID-19: Secondary | ICD-10-CM | POA: Diagnosis not present

## 2023-02-24 DIAGNOSIS — I2510 Atherosclerotic heart disease of native coronary artery without angina pectoris: Secondary | ICD-10-CM | POA: Diagnosis not present

## 2023-02-24 DIAGNOSIS — I493 Ventricular premature depolarization: Secondary | ICD-10-CM | POA: Diagnosis not present

## 2023-02-24 DIAGNOSIS — E119 Type 2 diabetes mellitus without complications: Secondary | ICD-10-CM | POA: Diagnosis not present

## 2023-02-24 DIAGNOSIS — I130 Hypertensive heart and chronic kidney disease with heart failure and stage 1 through stage 4 chronic kidney disease, or unspecified chronic kidney disease: Secondary | ICD-10-CM | POA: Diagnosis not present

## 2023-02-24 DIAGNOSIS — Z95810 Presence of automatic (implantable) cardiac defibrillator: Secondary | ICD-10-CM | POA: Diagnosis not present

## 2023-02-24 DIAGNOSIS — J9811 Atelectasis: Secondary | ICD-10-CM | POA: Diagnosis not present

## 2023-02-24 DIAGNOSIS — R1031 Right lower quadrant pain: Secondary | ICD-10-CM | POA: Diagnosis not present

## 2023-02-24 DIAGNOSIS — J90 Pleural effusion, not elsewhere classified: Secondary | ICD-10-CM | POA: Diagnosis not present

## 2023-02-24 DIAGNOSIS — I447 Left bundle-branch block, unspecified: Secondary | ICD-10-CM | POA: Diagnosis not present

## 2023-02-24 DIAGNOSIS — N179 Acute kidney failure, unspecified: Secondary | ICD-10-CM | POA: Diagnosis not present

## 2023-02-24 DIAGNOSIS — I7143 Infrarenal abdominal aortic aneurysm, without rupture: Secondary | ICD-10-CM | POA: Diagnosis not present

## 2023-02-24 DIAGNOSIS — I5023 Acute on chronic systolic (congestive) heart failure: Secondary | ICD-10-CM | POA: Diagnosis not present

## 2023-02-24 DIAGNOSIS — J449 Chronic obstructive pulmonary disease, unspecified: Secondary | ICD-10-CM | POA: Diagnosis not present

## 2023-02-24 DIAGNOSIS — I110 Hypertensive heart disease with heart failure: Secondary | ICD-10-CM | POA: Diagnosis not present

## 2023-02-24 DIAGNOSIS — R059 Cough, unspecified: Secondary | ICD-10-CM | POA: Diagnosis not present

## 2023-02-24 DIAGNOSIS — I700 Atherosclerosis of aorta: Secondary | ICD-10-CM | POA: Diagnosis not present

## 2023-02-24 DIAGNOSIS — N1832 Chronic kidney disease, stage 3b: Secondary | ICD-10-CM | POA: Diagnosis not present

## 2023-02-24 DIAGNOSIS — I340 Nonrheumatic mitral (valve) insufficiency: Secondary | ICD-10-CM | POA: Diagnosis not present

## 2023-02-24 DIAGNOSIS — E785 Hyperlipidemia, unspecified: Secondary | ICD-10-CM | POA: Diagnosis not present

## 2023-02-24 DIAGNOSIS — Z8616 Personal history of COVID-19: Secondary | ICD-10-CM | POA: Diagnosis not present

## 2023-02-24 DIAGNOSIS — I509 Heart failure, unspecified: Secondary | ICD-10-CM | POA: Diagnosis not present

## 2023-02-24 DIAGNOSIS — K5730 Diverticulosis of large intestine without perforation or abscess without bleeding: Secondary | ICD-10-CM | POA: Diagnosis not present

## 2023-02-24 DIAGNOSIS — A419 Sepsis, unspecified organism: Secondary | ICD-10-CM | POA: Diagnosis not present

## 2023-02-24 DIAGNOSIS — I4901 Ventricular fibrillation: Secondary | ICD-10-CM | POA: Diagnosis not present

## 2023-02-24 DIAGNOSIS — I480 Paroxysmal atrial fibrillation: Secondary | ICD-10-CM | POA: Diagnosis not present

## 2023-02-24 DIAGNOSIS — K219 Gastro-esophageal reflux disease without esophagitis: Secondary | ICD-10-CM | POA: Diagnosis not present

## 2023-02-24 DIAGNOSIS — D631 Anemia in chronic kidney disease: Secondary | ICD-10-CM | POA: Diagnosis not present

## 2023-02-24 DIAGNOSIS — K766 Portal hypertension: Secondary | ICD-10-CM | POA: Diagnosis not present

## 2023-02-24 DIAGNOSIS — J9621 Acute and chronic respiratory failure with hypoxia: Secondary | ICD-10-CM | POA: Diagnosis not present

## 2023-02-24 DIAGNOSIS — I252 Old myocardial infarction: Secondary | ICD-10-CM | POA: Diagnosis not present

## 2023-02-24 NOTE — Progress Notes (Signed)
[place],acting as a scribe for Lelon Huh, MD.,have documented all relevant documentation on the behalf of Lelon Huh, MD,as directed by  Lelon Huh, MD while in the presence of Lelon Huh, MD.     Established patient visit   Patient: Taylor [name]   DOB: 04-27-1941   82 y.o. Male  MRN: [id number] Visit Date: 02/24/2023  Today's healthcare provider: Lelon Huh, MD   No chief complaint on file.  Subjective    HPI  Patient is an 82 year old male who presents for evaluation of issue with his right great toe.  He  states he has had it for about 3 months.  He states it was getting better but then got a blister.  The blister has since popped and he states it is discolored and he is concerned about possible infection.   Patient comes in today with increased difficulty and pain with breathing.  He states it started yesterday and has kept him awake during the night.  He reports his oxygen has stayed 97-100 during this time.  He states this is new for him to be this bad.  He also complains of some discomfort around the recent pacemaker he had placed.  HHe states it is bulging more as well.    Medications: Outpatient Medications Prior to Visit  Medication Sig   albuterol (VENTOLIN HFA) 108 (90 Base) MCG/ACT inhaler Inhale 2 puffs into the lungs every 6 (six) hours as needed for wheezing or shortness of breath.   allopurinol (ZYLOPRIM) 300 MG tablet Take 0.5 tablets (150 mg total) by mouth daily.   aspirin 81 MG EC tablet Take 81 mg by mouth daily.    atorvastatin (LIPITOR) 40 MG tablet TAKE 1 TABLET(40 MG) BY MOUTH DAILY   carvedilol (COREG) 12.5 MG tablet Take 12.5 mg by mouth 2 (two) times daily with a meal. Daily   clarithromycin (BIAXIN XL) 500 MG 24 hr tablet Take 2 tablets (1,000 mg total) by mouth daily for 14 days.   empagliflozin (JARDIANCE) 10 MG TABS tablet Take by mouth daily.   glucose blood (ONETOUCH VERIO) test strip Use as instructed to check sugar daily    isosorbide mononitrate (IMDUR) 30 MG 24 hr tablet TAKE 1/2 TABLET(15 MG) BY MOUTH DAILY   Magnesium 200 MG TABS Take 1 tablet (200 mg total) by mouth in the morning and at bedtime.   pantoprazole (PROTONIX) 40 MG tablet TAKE 1 TABLET BY MOUTH EVERY DAY   potassium chloride SA (KLOR-CON M) 20 MEQ tablet Take 20 mEq by mouth daily.   torsemide (DEMADEX) 20 MG tablet Take 40 mg by mouth daily.   triamcinolone ointment (KENALOG) 0.1 % APPLY TOPICALLY TWICE DAILY AS DIRECTED   apixaban (ELIQUIS) 2.5 MG TABS tablet Take 2 tablets (5 mg total) by mouth 2 (two) times daily.   [DISCONTINUED] Calcium Carbonate Antacid 600 MG chewable tablet Chew 1 tablet by mouth daily. (Patient not taking: Reported on 02/13/2023)   No facility-administered medications prior to visit.    Review of Systems  Constitutional:  Negative for chills and fever.  Respiratory:  Positive for shortness of breath. Negative for cough, chest tightness and wheezing.        Objective    BP 120/78 (BP Location: Left Arm, Patient Position: Sitting, Cuff Size: Normal)   Pulse 69   Temp (!) 97.3 F (36.3 C) (Oral)   Wt 197 lb (89.4 kg)   SpO2 96%   BMI 28.27 kg/m  Physical Exam   General: Appearance:    Well developed, well nourished male in no acute distress  Eyes:    PERRL, conjunctiva/corneas clear, EOM's intact       Lungs:     Clear to auscultation bilaterally, respirations unlabored  Heart:    Normal heart rate. Regularly irregular rhythm.  2/6 blowing, holosystolic murmur at apex   Moderate non-tender swelling at site of recent pacemaker placement.   GU:   Tender fulness of right inguinal area, limited by obesity, but no overt hernia palpated.   Neurologic:   Awake, alert, oriented x 3. No apparent focal neurological defect.          Assessment & Plan     1. Cellulitis of toe of left foot Is improvement, he started clarithromycin on 02/14/2023 after pacemaker placement which is likely helping with cellulitis.  Has another week on it. Is to call if toe does not continue to improve.   2. Shortness of breath Normal O2 saturations.  - DG Chest 2 View; Future  He does follow up with cardiology this afternoon for pacemaker check.   3. Right inguinal pain Recent onset. Consider surgery referral if progesses.        The entirety of the information documented in the History of Present Illness, Review of Systems and Physical Exam were personally obtained by me. Portions of this information were initially documented by the CMA and reviewed by me for thoroughness and accuracy.     Lelon Huh, MD  [place] [phone] (phone) [phone] (fax)  [place]

## 2023-02-25 ENCOUNTER — Ambulatory Visit: Payer: Self-pay

## 2023-02-25 ENCOUNTER — Emergency Department: Payer: Medicare Other

## 2023-02-25 ENCOUNTER — Inpatient Hospital Stay: Payer: Medicare Other

## 2023-02-25 ENCOUNTER — Inpatient Hospital Stay
Admission: EM | Admit: 2023-02-25 | Discharge: 2023-02-27 | DRG: 291 | Disposition: A | Discharge status: Home / Self Care | Payer: Medicare Other | Attending: Osteopathic Medicine | Admitting: Osteopathic Medicine

## 2023-02-25 ENCOUNTER — Other Ambulatory Visit: Payer: Self-pay

## 2023-02-25 DIAGNOSIS — Z7901 Long term (current) use of anticoagulants: Secondary | ICD-10-CM

## 2023-02-25 DIAGNOSIS — D631 Anemia in chronic kidney disease: Secondary | ICD-10-CM | POA: Diagnosis present

## 2023-02-25 DIAGNOSIS — N184 Chronic kidney disease, stage 4 (severe): Secondary | ICD-10-CM | POA: Diagnosis not present

## 2023-02-25 DIAGNOSIS — J449 Chronic obstructive pulmonary disease, unspecified: Secondary | ICD-10-CM | POA: Diagnosis not present

## 2023-02-25 DIAGNOSIS — Z808 Family history of malignant neoplasm of other organs or systems: Secondary | ICD-10-CM

## 2023-02-25 DIAGNOSIS — I129 Hypertensive chronic kidney disease with stage 1 through stage 4 chronic kidney disease, or unspecified chronic kidney disease: Secondary | ICD-10-CM | POA: Diagnosis not present

## 2023-02-25 DIAGNOSIS — K219 Gastro-esophageal reflux disease without esophagitis: Secondary | ICD-10-CM | POA: Diagnosis present

## 2023-02-25 DIAGNOSIS — I700 Atherosclerosis of aorta: Secondary | ICD-10-CM | POA: Diagnosis present

## 2023-02-25 DIAGNOSIS — Z79899 Other long term (current) drug therapy: Secondary | ICD-10-CM

## 2023-02-25 DIAGNOSIS — I259 Chronic ischemic heart disease, unspecified: Secondary | ICD-10-CM | POA: Diagnosis not present

## 2023-02-25 DIAGNOSIS — I252 Old myocardial infarction: Secondary | ICD-10-CM | POA: Diagnosis not present

## 2023-02-25 DIAGNOSIS — Z955 Presence of coronary angioplasty implant and graft: Secondary | ICD-10-CM

## 2023-02-25 DIAGNOSIS — I509 Heart failure, unspecified: Secondary | ICD-10-CM | POA: Diagnosis not present

## 2023-02-25 DIAGNOSIS — N179 Acute kidney failure, unspecified: Secondary | ICD-10-CM | POA: Diagnosis not present

## 2023-02-25 DIAGNOSIS — I480 Paroxysmal atrial fibrillation: Secondary | ICD-10-CM | POA: Diagnosis present

## 2023-02-25 DIAGNOSIS — E669 Obesity, unspecified: Secondary | ICD-10-CM | POA: Diagnosis present

## 2023-02-25 DIAGNOSIS — Z8249 Family history of ischemic heart disease and other diseases of the circulatory system: Secondary | ICD-10-CM

## 2023-02-25 DIAGNOSIS — K766 Portal hypertension: Secondary | ICD-10-CM | POA: Diagnosis present

## 2023-02-25 DIAGNOSIS — E1122 Type 2 diabetes mellitus with diabetic chronic kidney disease: Secondary | ICD-10-CM | POA: Diagnosis not present

## 2023-02-25 DIAGNOSIS — Z888 Allergy status to other drugs, medicaments and biological substances status: Secondary | ICD-10-CM

## 2023-02-25 DIAGNOSIS — K5730 Diverticulosis of large intestine without perforation or abscess without bleeding: Secondary | ICD-10-CM | POA: Diagnosis not present

## 2023-02-25 DIAGNOSIS — Z950 Presence of cardiac pacemaker: Secondary | ICD-10-CM | POA: Diagnosis not present

## 2023-02-25 DIAGNOSIS — R188 Other ascites: Secondary | ICD-10-CM | POA: Diagnosis present

## 2023-02-25 DIAGNOSIS — M109 Gout, unspecified: Secondary | ICD-10-CM | POA: Diagnosis present

## 2023-02-25 DIAGNOSIS — Z881 Allergy status to other antibiotic agents status: Secondary | ICD-10-CM

## 2023-02-25 DIAGNOSIS — R0602 Shortness of breath: Secondary | ICD-10-CM | POA: Diagnosis not present

## 2023-02-25 DIAGNOSIS — N1832 Chronic kidney disease, stage 3b: Secondary | ICD-10-CM | POA: Diagnosis not present

## 2023-02-25 DIAGNOSIS — A419 Sepsis, unspecified organism: Secondary | ICD-10-CM | POA: Diagnosis not present

## 2023-02-25 DIAGNOSIS — I2510 Atherosclerotic heart disease of native coronary artery without angina pectoris: Secondary | ICD-10-CM | POA: Diagnosis present

## 2023-02-25 DIAGNOSIS — R059 Cough, unspecified: Secondary | ICD-10-CM | POA: Diagnosis not present

## 2023-02-25 DIAGNOSIS — Z8673 Personal history of transient ischemic attack (TIA), and cerebral infarction without residual deficits: Secondary | ICD-10-CM

## 2023-02-25 DIAGNOSIS — I4891 Unspecified atrial fibrillation: Secondary | ICD-10-CM | POA: Diagnosis present

## 2023-02-25 DIAGNOSIS — I493 Ventricular premature depolarization: Secondary | ICD-10-CM | POA: Diagnosis present

## 2023-02-25 DIAGNOSIS — Z8261 Family history of arthritis: Secondary | ICD-10-CM

## 2023-02-25 DIAGNOSIS — Z7982 Long term (current) use of aspirin: Secondary | ICD-10-CM

## 2023-02-25 DIAGNOSIS — I5023 Acute on chronic systolic (congestive) heart failure: Secondary | ICD-10-CM | POA: Diagnosis present

## 2023-02-25 DIAGNOSIS — I447 Left bundle-branch block, unspecified: Secondary | ICD-10-CM | POA: Diagnosis present

## 2023-02-25 DIAGNOSIS — Z95810 Presence of automatic (implantable) cardiac defibrillator: Secondary | ICD-10-CM

## 2023-02-25 DIAGNOSIS — J9601 Acute respiratory failure with hypoxia: Secondary | ICD-10-CM | POA: Diagnosis present

## 2023-02-25 DIAGNOSIS — I10 Essential (primary) hypertension: Secondary | ICD-10-CM | POA: Diagnosis present

## 2023-02-25 DIAGNOSIS — I255 Ischemic cardiomyopathy: Secondary | ICD-10-CM | POA: Diagnosis present

## 2023-02-25 DIAGNOSIS — Z8674 Personal history of sudden cardiac arrest: Secondary | ICD-10-CM

## 2023-02-25 DIAGNOSIS — Z8616 Personal history of COVID-19: Secondary | ICD-10-CM | POA: Diagnosis not present

## 2023-02-25 DIAGNOSIS — I5020 Unspecified systolic (congestive) heart failure: Secondary | ICD-10-CM | POA: Diagnosis not present

## 2023-02-25 DIAGNOSIS — I110 Hypertensive heart disease with heart failure: Secondary | ICD-10-CM | POA: Diagnosis not present

## 2023-02-25 DIAGNOSIS — Z7984 Long term (current) use of oral hypoglycemic drugs: Secondary | ICD-10-CM

## 2023-02-25 DIAGNOSIS — Z9981 Dependence on supplemental oxygen: Secondary | ICD-10-CM

## 2023-02-25 DIAGNOSIS — Z85820 Personal history of malignant melanoma of skin: Secondary | ICD-10-CM

## 2023-02-25 DIAGNOSIS — I482 Chronic atrial fibrillation, unspecified: Secondary | ICD-10-CM | POA: Diagnosis not present

## 2023-02-25 DIAGNOSIS — E785 Hyperlipidemia, unspecified: Secondary | ICD-10-CM | POA: Diagnosis present

## 2023-02-25 DIAGNOSIS — I25810 Atherosclerosis of coronary artery bypass graft(s) without angina pectoris: Secondary | ICD-10-CM | POA: Diagnosis not present

## 2023-02-25 DIAGNOSIS — J9621 Acute and chronic respiratory failure with hypoxia: Secondary | ICD-10-CM | POA: Diagnosis not present

## 2023-02-25 DIAGNOSIS — G4730 Sleep apnea, unspecified: Secondary | ICD-10-CM | POA: Diagnosis present

## 2023-02-25 DIAGNOSIS — Z1152 Encounter for screening for COVID-19: Secondary | ICD-10-CM

## 2023-02-25 DIAGNOSIS — Z6824 Body mass index (BMI) 24.0-24.9, adult: Secondary | ICD-10-CM

## 2023-02-25 DIAGNOSIS — I340 Nonrheumatic mitral (valve) insufficiency: Secondary | ICD-10-CM | POA: Diagnosis present

## 2023-02-25 DIAGNOSIS — N183 Chronic kidney disease, stage 3 unspecified: Secondary | ICD-10-CM | POA: Diagnosis present

## 2023-02-25 DIAGNOSIS — J9811 Atelectasis: Secondary | ICD-10-CM | POA: Diagnosis present

## 2023-02-25 DIAGNOSIS — Z8551 Personal history of malignant neoplasm of bladder: Secondary | ICD-10-CM

## 2023-02-25 DIAGNOSIS — J90 Pleural effusion, not elsewhere classified: Secondary | ICD-10-CM | POA: Diagnosis not present

## 2023-02-25 DIAGNOSIS — Z961 Presence of intraocular lens: Secondary | ICD-10-CM | POA: Diagnosis present

## 2023-02-25 DIAGNOSIS — I5022 Chronic systolic (congestive) heart failure: Secondary | ICD-10-CM | POA: Diagnosis not present

## 2023-02-25 DIAGNOSIS — I130 Hypertensive heart and chronic kidney disease with heart failure and stage 1 through stage 4 chronic kidney disease, or unspecified chronic kidney disease: Secondary | ICD-10-CM | POA: Diagnosis present

## 2023-02-25 DIAGNOSIS — Z951 Presence of aortocoronary bypass graft: Secondary | ICD-10-CM

## 2023-02-25 DIAGNOSIS — K2270 Barrett's esophagus without dysplasia: Secondary | ICD-10-CM | POA: Diagnosis present

## 2023-02-25 DIAGNOSIS — Z87891 Personal history of nicotine dependence: Secondary | ICD-10-CM

## 2023-02-25 DIAGNOSIS — J9600 Acute respiratory failure, unspecified whether with hypoxia or hypercapnia: Secondary | ICD-10-CM | POA: Diagnosis not present

## 2023-02-25 LAB — BODY FLUID CELL COUNT WITH DIFFERENTIAL
Eos, Fluid: 0 %
Lymphs, Fluid: 79 %
Monocyte-Macrophage-Serous Fluid: 9 %
Neutrophil Count, Fluid: 12 %
Total Nucleated Cell Count, Fluid: 297 cu mm

## 2023-02-25 LAB — TROPONIN I (HIGH SENSITIVITY)
Troponin I (High Sensitivity): 17 ng/L (ref <18)
Troponin I (High Sensitivity): 16 ng/L (ref <18)

## 2023-02-25 LAB — URINALYSIS, ROUTINE W REFLEX MICROSCOPIC
Bilirubin Urine: NEGATIVE
Glucose, UA: 50 mg/dL — AB
Hgb urine dipstick: NEGATIVE
Ketones, ur: NEGATIVE mg/dL
Leukocytes,Ua: NEGATIVE
Nitrite: NEGATIVE
Protein, ur: NEGATIVE mg/dL
Specific Gravity, Urine: 1.006 (ref 1.005–1.030)
Squamous Epithelial / HPF: NONE SEEN /HPF (ref 0–5)
pH: 5 (ref 5.0–8.0)

## 2023-02-25 LAB — PROTEIN, PLEURAL OR PERITONEAL FLUID
Total protein, fluid: 3.5 g/dL
Total protein, fluid: 3.5 g/dL

## 2023-02-25 LAB — COMPREHENSIVE METABOLIC PANEL
ALT: 13 U/L (ref 0–44)
AST: 37 U/L (ref 15–41)
Albumin: 3.4 g/dL — ABNORMAL LOW (ref 3.5–5.0)
Alkaline Phosphatase: 174 U/L — ABNORMAL HIGH (ref 38–126)
Anion gap: 12 (ref 5–15)
BUN: 54 mg/dL — ABNORMAL HIGH (ref 8–23)
CO2: 24 mmol/L (ref 22–32)
Calcium: 8.7 mg/dL — ABNORMAL LOW (ref 8.9–10.3)
Chloride: 98 mmol/L (ref 98–111)
Creatinine, Ser: 3.06 mg/dL — ABNORMAL HIGH (ref 0.61–1.24)
GFR, Estimated: 20 mL/min — ABNORMAL LOW (ref >60)
Glucose, Bld: 114 mg/dL — ABNORMAL HIGH (ref 70–99)
Potassium: 3.6 mmol/L (ref 3.5–5.1)
Sodium: 134 mmol/L — ABNORMAL LOW (ref 135–145)
Total Bilirubin: 3.2 mg/dL — ABNORMAL HIGH (ref 0.3–1.2)
Total Protein: 8.2 g/dL — ABNORMAL HIGH (ref 6.5–8.1)

## 2023-02-25 LAB — CBC WITH DIFFERENTIAL/PLATELET
Abs Immature Granulocytes: 0.05 10*3/uL (ref 0.00–0.07)
Basophils Absolute: 0.1 10*3/uL (ref 0.0–0.1)
Basophils Relative: 1 %
Eosinophils Absolute: 0.2 10*3/uL (ref 0.0–0.5)
Eosinophils Relative: 3 %
HCT: 39.6 % (ref 39.0–52.0)
Hemoglobin: 12.2 g/dL — ABNORMAL LOW (ref 13.0–17.0)
Immature Granulocytes: 1 %
Lymphocytes Relative: 22 %
Lymphs Abs: 1.1 10*3/uL (ref 0.7–4.0)
MCH: 31.4 pg (ref 26.0–34.0)
MCHC: 30.8 g/dL (ref 30.0–36.0)
MCV: 102.1 fL — ABNORMAL HIGH (ref 80.0–100.0)
Monocytes Absolute: 0.4 10*3/uL (ref 0.1–1.0)
Monocytes Relative: 8 %
Neutro Abs: 3.4 10*3/uL (ref 1.7–7.7)
Neutrophils Relative %: 65 %
Platelets: 143 10*3/uL — ABNORMAL LOW (ref 150–400)
RBC: 3.88 MIL/uL — ABNORMAL LOW (ref 4.22–5.81)
RDW: 20.5 % — ABNORMAL HIGH (ref 11.5–15.5)
WBC: 5.3 10*3/uL (ref 4.0–10.5)
nRBC: 0 % (ref 0.0–0.2)

## 2023-02-25 LAB — BLOOD GAS, VENOUS
Acid-Base Excess: 1.9 mmol/L (ref 0.0–2.0)
Bicarbonate: 28.2 mmol/L — ABNORMAL HIGH (ref 20.0–28.0)
O2 Saturation: 25 %
Patient temperature: 37
pCO2, Ven: 50 mmHg (ref 44–60)
pH, Ven: 7.36 (ref 7.25–7.43)
pO2, Ven: <31 mmHg — CL (ref 32–45)

## 2023-02-25 LAB — RESP PANEL BY RT-PCR (RSV, FLU A&B, COVID)  RVPGX2
Influenza A by PCR: NEGATIVE
Influenza B by PCR: NEGATIVE
Resp Syncytial Virus by PCR: NEGATIVE
SARS Coronavirus 2 by RT PCR: NEGATIVE

## 2023-02-25 LAB — BILIRUBIN, FRACTIONATED(TOT/DIR/INDIR)
Bilirubin, Direct: 1.4 mg/dL — ABNORMAL HIGH (ref 0.0–0.2)
Indirect Bilirubin: 1.6 mg/dL — ABNORMAL HIGH (ref 0.3–0.9)
Total Bilirubin: 3 mg/dL — ABNORMAL HIGH (ref 0.3–1.2)

## 2023-02-25 LAB — LIPASE, BLOOD: Lipase: 46 U/L (ref 11–51)

## 2023-02-25 LAB — ALBUMIN, PLEURAL OR PERITONEAL FLUID: Albumin, Fluid: 1.7 g/dL

## 2023-02-25 LAB — BRAIN NATRIURETIC PEPTIDE: B Natriuretic Peptide: 1076.2 pg/mL — ABNORMAL HIGH (ref 0.0–100.0)

## 2023-02-25 MED ORDER — FUROSEMIDE 10 MG/ML IJ SOLN
80.0000 mg | Freq: Once | INTRAMUSCULAR | Status: AC
  Administered 2023-02-25: 80 mg via INTRAVENOUS
  Filled 2023-02-25: qty 8

## 2023-02-25 MED ORDER — LIDOCAINE HCL (PF) 1 % IJ SOLN
10.0000 mL | Freq: Once | INTRAMUSCULAR | Status: AC
  Administered 2023-02-25: 10 mL via INTRADERMAL

## 2023-02-25 MED ORDER — SODIUM CHLORIDE 0.9 % IV SOLN
250.0000 mL | INTRAVENOUS | Status: DC | PRN
Start: 2023-02-25 — End: 2023-02-25

## 2023-02-25 MED ORDER — ONDANSETRON HCL 4 MG PO TABS: 4.0000 mg | ORAL_TABLET | Freq: Four times a day (QID) | ORAL | Status: DC | PRN

## 2023-02-25 MED ORDER — APIXABAN 2.5 MG PO TABS
2.5000 mg | ORAL_TABLET | Freq: Two times a day (BID) | ORAL | Status: DC
  Administered 2023-02-25 – 2023-02-27 (×4): 2.5 mg via ORAL
  Filled 2023-02-25 (×5): qty 1

## 2023-02-25 MED ORDER — ISOSORBIDE MONONITRATE ER 30 MG PO TB24
15.0000 mg | ORAL_TABLET | Freq: Every day | ORAL | Status: DC
  Administered 2023-02-26 – 2023-02-27 (×2): 15 mg via ORAL
  Filled 2023-02-25 (×2): qty 1

## 2023-02-25 MED ORDER — CARVEDILOL 6.25 MG PO TABS
6.2500 mg | ORAL_TABLET | Freq: Two times a day (BID) | ORAL | Status: DC
  Administered 2023-02-25 – 2023-02-27 (×4): 6.25 mg via ORAL
  Filled 2023-02-25 (×4): qty 1

## 2023-02-25 MED ORDER — ATORVASTATIN CALCIUM 20 MG PO TABS
40.0000 mg | ORAL_TABLET | Freq: Every day | ORAL | Status: DC
  Administered 2023-02-26 – 2023-02-27 (×2): 40 mg via ORAL
  Filled 2023-02-25 (×2): qty 2

## 2023-02-25 MED ORDER — FUROSEMIDE 10 MG/ML IJ SOLN
60.0000 mg | Freq: Two times a day (BID) | INTRAMUSCULAR | Status: DC
  Administered 2023-02-26 – 2023-02-27 (×3): 60 mg via INTRAVENOUS
  Filled 2023-02-25 (×3): qty 6

## 2023-02-25 MED ORDER — SODIUM CHLORIDE 0.9% FLUSH
3.0000 mL | Freq: Two times a day (BID) | INTRAVENOUS | Status: DC
Start: 2023-02-25 — End: 2023-02-25

## 2023-02-25 MED ORDER — ENOXAPARIN SODIUM 30 MG/0.3ML IJ SOSY: 30.0000 mg | PREFILLED_SYRINGE | INTRAMUSCULAR | Status: DC

## 2023-02-25 MED ORDER — ALBUMIN HUMAN 5 % IV SOLN
25.0000 g | Freq: Once | INTRAVENOUS | Status: AC
  Administered 2023-02-25: 25 g via INTRAVENOUS
  Filled 2023-02-25: qty 500

## 2023-02-25 MED ORDER — ASPIRIN 81 MG PO CHEW
81.0000 mg | CHEWABLE_TABLET | Freq: Every day | ORAL | Status: DC
  Administered 2023-02-26 – 2023-02-27 (×2): 81 mg via ORAL
  Filled 2023-02-25 (×2): qty 1

## 2023-02-25 MED ORDER — ONDANSETRON HCL 4 MG/2ML IJ SOLN: 4.0000 mg | Freq: Four times a day (QID) | INTRAMUSCULAR | Status: DC | PRN

## 2023-02-25 MED ORDER — SODIUM CHLORIDE 0.9% FLUSH
3.0000 mL | INTRAVENOUS | Status: DC | PRN
Start: 2023-02-25 — End: 2023-02-25

## 2023-02-25 NOTE — ED Provider Notes (Addendum)
[facility] Provider Note    Event Date/Time   First MD Initiated Contact with Patient 02/25/23 [id number]     (approximate)   History   Shortness of Breath   HPI  Taylor [name] is a 82 y.o. male with history of COPD who comes in with concern for shortness of breath that started this morning associated with a cough.  On review of the cardiology note from 4/1 patient has a history of heart failure, CABG, ICD, A-fib, diabetes V-fib arrest in 1994 status post MI, abdominal aortic aneurysm repairs, CKD.  Patient reports having shortness of breath that started this morning.  He reports a strong cough as well does report history of COPD and trying a breathing treatment at home but it causing more coughing and not much improvement in the shortness of breath.  He reports some worsening leg swelling and swelling in his abdomen.  He states that he is currently on torsemide 60 mg in the morning 40 mg at nighttime and they just started him on metolazone to trial before the torsemide.  He reports he is only done 1 dose of this.  He reports that his pacemaker was recently replaced.  Patient does report being on Eliquis and being compliant with it.       Physical Exam   Triage Vital Signs: Blood pressure (!) 118/99, pulse 77, temperature 97.6 F (36.4 C), temperature source Oral, resp. rate 13, height 5\' 10"  (1.778 m), weight 89.4 kg, SpO2 100 %.  Most recent vital signs: Vitals:   02/25/23 0918 02/25/23 0930  BP: (!) 125/109 125/71  Pulse: 79 (!) 54  Resp: (!) 22 18  Temp: 97.6 F (36.4 C)   SpO2: 98% 99%     General: Awake, no distress.  CV:  Good peripheral perfusion.  Resp:  Mild increased work of breathing without obvious wheezing Abd:  Abdomen slightly distended with some fullness Other:  Edema noted in bilateral legs   ED Results / Procedures / Treatments   Labs (all labs ordered are listed, but only abnormal results are displayed) Labs Reviewed  CBC  WITH DIFFERENTIAL/PLATELET - Abnormal; Notable for the following components:      Result Value   RBC 3.88 (*)    Hemoglobin 12.2 (*)    MCV 102.1 (*)    RDW 20.5 (*)    Platelets 143 (*)    All other components within normal limits  BLOOD GAS, VENOUS - Abnormal; Notable for the following components:   pO2, Ven <31 (*)    Bicarbonate 28.2 (*)    All other components within normal limits  RESP PANEL BY RT-PCR (RSV, FLU A&B, COVID)  [id number]  COMPREHENSIVE METABOLIC PANEL  BRAIN NATRIURETIC PEPTIDE  URINALYSIS, ROUTINE W REFLEX MICROSCOPIC  LIPASE, BLOOD  TROPONIN I (HIGH SENSITIVITY)     EKG  My interpretation of EKG:  Atrial fibrillation rate of 74 with some mild ST sloping in 2 3 aVF, left bundle branch block  RADIOLOGY I have reviewed the xray personally and interpreted and patient appears to have pulmonary edema  PROCEDURES:  Critical Care performed: No  .1-3 Lead EKG Interpretation  Performed by: Vanessa Bowie, MD Authorized by: Vanessa Orion, MD     Interpretation: abnormal     ECG rate:  50   ECG rate assessment: normal     Rhythm: sinus bradycardia     Ectopy: none     Conduction: normal      MEDICATIONS  ORDERED IN ED: Medications - No data to display   IMPRESSION / MDM / [place] / ED COURSE  I reviewed the triage vital signs and the nursing notes.   Patient's presentation is most consistent with acute presentation with potential threat to life or bodily function.   Patient comes in with shortness of breath some mild increased work of breathing although not hypoxic.  Differential is most likely CHF.  He does not have any significant wheezing on examination but he did use a breathing treatment prior to coming in.  VBG is reassuring without any evidence of hypercapnia.  CBC shows normal white count stable hemoglobin.  Patient CMP shows stable creatinine.  Patient's x-ray concerning for edema I did give a dose of IV Lasix today given the concern  for patient's increased work of breathing.  I got some CT scans to rule out any other acute pathology given his abdominal distention.  He denies any falls hitting his head or headaches.  CT imaging with concern for ascites.  He is got no fever no white count doubt SBP he reports that the has had worsening swelling his abdomen for over a month -will get studies and order an ultrasound guided tap.  I suspect that this is causing some of his shortness of breath just from the compression of how large his abdomen is he will need multiple liters taken off. I will discuss the hospital team for admission due to concerns for new onset ascites, shortness of breath   The patient is on the cardiac monitor to evaluate for evidence of arrhythmia and/or significant heart rate changes.  Clinical Course as of 02/25/23 1101  Tue Feb 25, 2023  1053 ECG Heart Rate: 74 [MF]    Clinical Course User Index [MF] Vanessa Wagoner, MD     FINAL CLINICAL IMPRESSION(S) / ED DIAGNOSES   Final diagnoses:  Congestive heart failure, unspecified HF chronicity, unspecified heart failure type  Other ascites     Rx / DC Orders   ED Discharge Orders     None        Note:  This document was prepared using Dragon voice recognition software and may include unintentional dictation errors.   Vanessa [place], MD 02/25/23 1211    Vanessa [place], MD 02/25/23 1215

## 2023-02-25 NOTE — Assessment & Plan Note (Signed)
O2 requirement of roughly 5 to 6 L in the setting of CHF and ascites Patient reports home oxygen prescription that he does not use Chest x-ray with pulmonary edema CT of the abdomen pelvis showing marked ascites and mesenteric edema Status post 80 mg of IV Lasix in ER Pending paracentesis Noted creatinine of 3 with GFR in the 20s Suspect volume overload in setting of CHF with poor renal function as likely etiology Will monitor respiratory and volume status Patient followed at [facility] clinic outpatient-they have been notified for consult Will also reach out to Dr. Juleen China with nephrology for input. Follow closely

## 2023-02-25 NOTE — Consult Note (Signed)
[place] NOTE       Patient ID: Taylor [name] MRN: [id number] DOB/AGE: 06/16/1941 82 y.o.  Admit date: 02/25/2023 Referring Physician Dr. Shanda [name] Primary Physician Dr. Lelon [name] Primary Cardiologist Dr. Saralyn [name] Reason for Consultation AoCHF  HPI: Taylor [name] is an 82yoM with a PMH of chronic HFrEF (EF 20%, moderate MR 02/17/2023), CAD s/p CABG 08/2016, hx VF arrest s/p MI in 1994 s/p dual-chamber ICD (2014) and recent battery change out 02/13/2023, paroxysmal AF (Eliquis), CKD 4, AAA s/p EVAR (2008), COPD, DM2 who presented to [facility] ED 02/25/2023 with shortness of breath and associated cough, abdominal and leg swelling failing outpatient increase in diuretics.  Cardiology is consulted for assistance with his heart failure.  Patient states for the past 2 weeks or so he has had worsening dyspnea on exertion, peripheral edema and abdominal distention despite his usual diuretic regimen of 60 mg of torsemide in the morning and 40 mg torsemide in the evening.  He thinks his urine output has been diminished lately as well.  He was seen in clinic yesterday by Dr. Saralyn [name] for follow-up of his defibrillator generator change out performed 02/13/2023 where he complained of similar symptoms and was prescribed metolazone 5 mg daily for 3 days to be taken before his torsemide.  He took 1 dose of metolazone this morning without significant improvement.   He notes that he ate country ham, mashed potatoes, and chicken pot pie for dinner last night which was prepared by his family for Easter which are unusual foods for him. He admits to orthopnea and woke up this morning significantly short of breath, called his PCPs office for their recommendations, and they referred him to the emergency department.  Initially he was requiring 6 L of oxygen by nasal cannula with none at baseline, and CT chest abdomen pelvis was performed which revealed moderate to severe ascites with anasarca, and  mesenteric stranding, severe atrophy of the left kidney, a small right pleural effusion and bilateral partially calcified pleural plaques.  He underwent paracentesis with 6 L of fluid aspirated with significant clinical improvement.  He also received 80 mg of IV Lasix x 1 with about 900 mL of urine output thus far.  Recent vitals are notable for a blood pressure of 133/96, SpO2 >90% on 6 L by nasal cannula.  Heart rate in the 70s to 80s in atrial fibrillation with occasional PVCs on telemetry.  Labs notable for BUN/creatinine 54/3.06 and GFR of 20.  Alk phos elevated but unchanged from check a couple months ago at 174, AST ALT within normal limits, T. bili elevated at 3.0, direct bili 1.4 and indirect bili 1.6.  BNP elevated at 1076, high-sensitivity troponin negative at 17, 16.  No white count with WBCs 5.3, H&H 12.2/39.6, and platelets actually increasing usual at 143,000.  Review of systems complete and found to be negative unless listed above     Past Medical History:  Diagnosis Date   AAA (abdominal aortic aneurysm) ([place]) 06/03/2007   [facility]; Dr. Kellie Simmering   AICD (automatic cardioverter/defibrillator) present    Arrhythmia    atrial fibrillation   Barrett's esophagus    Bladder cancer [facility])    Bradycardia    CAD (coronary artery disease)    CAP (community acquired pneumonia) 11/13/2019   CHF (congestive heart failure) ([place])    Cluster headache    COVID-19 09/27/2021   DDD (degenerative disc disease), lumbar    Diabetes mellitus without complication ([place])  Dyspnea    WITH EXERTION   Edema    LEFT ANKLE   Fracture of skull base ([place]) 1997   due to fall   GERD (gastroesophageal reflux disease)    Gout    History of bladder cancer 12/1995   Hyperlipidemia    Hypertension    Hypocalcemia 04/19/2020   Possibly secondary to diuretics.   Low=5.9 04/19/2020   Malignant melanoma ([place]) 12/2012   right dorsal forearm excised   Myocardial infarction [facility])     LAST 2014   Osteoarthritis of knee    Other specified complication of vascular prosthetic devices, implants and grafts, initial encounter ([place]) 08/22/2021   Pacemaker 10/10/2006   Pancreatitis 11/22/2019   Pneumonia    2016   Pre-diabetes    Psoriasis    Rib fracture 1997   due to fall   Sleep apnea    CPAP   Stroke [facility])    Venous incompetence     Past Surgical History:  Procedure Laterality Date   ABDOMINAL AORTIC ANEURYSM REPAIR  06/03/2007   [facility]; Dr. Kellie Simmering   ANGIOPLASTY  1994   MI   BLADDER TUMOR EXCISION  12/1995   CAROTID PTA/STENT INTERVENTION Left 07/23/2022   Procedure: CAROTID PTA/STENT INTERVENTION;  Surgeon: Katha Cabal, MD;  Location: [place] CV LAB;  Service: Cardiovascular;  Laterality: Left;   CATARACT EXTRACTION W/PHACO Left 10/22/2016   Procedure: CATARACT EXTRACTION PHACO AND INTRAOCULAR LENS PLACEMENT (IOC);  Surgeon: Birder Robson, MD;  Location: [facility] ORS;  Service: Ophthalmology;  Laterality: Left;  [place] 47.7 AP% 18.4 CDE 8.78 Fluid pack lot # [id number]   CATARACT EXTRACTION W/PHACO Right 12/10/2016   Procedure: CATARACT EXTRACTION PHACO AND INTRAOCULAR LENS PLACEMENT (IOC);  Surgeon: Birder Robson, MD;  Location: [facility] ORS;  Service: Ophthalmology;  Laterality: Right;  [place] 00:39 AP% 23.3 CDE 9.13 Fluid pack lot # [id number]   CORONARY ANGIOPLASTY     STENTS X 5   CORONARY ARTERY BYPASS GRAFT  09/22/2006   four   ELBOW BURSA SURGERY     DUE TO GOUT   INSERT / REPLACE / REMOVE PACEMAKER     MELANOMA EXCISION  12/2012   Right forearm   PPM GENERATOR CHANGEOUT N/A 02/13/2023   Procedure: PPM GENERATOR CHANGEOUT;  Surgeon: Isaias Cowman, MD;  Location: [place] [facility];  Service: Cardiovascular;  Laterality: N/A;    (Not in a hospital admission)  Social History   Socioeconomic History   Marital status: Married    Spouse name: Mardene Celeste   Number of children: 3   Years of education: Not on file    Highest education level: 12th grade  Occupational History   Occupation: retired    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  Independent at baseline.   Social Determinants of Health   Financial Resource Strain: Low Risk  (05/07/2022)   Overall Financial Resource Strain (CARDIA)    Difficulty of Paying Living Expenses: Not hard at all  Food Insecurity: No Food Insecurity (11/04/2022)   Hunger Vital Sign    Worried About Running Out of Food in the Last Year: Never true    Ran Out of Food in the Last Year: Never true  Transportation Needs: No Transportation Needs (11/04/2022)   PRAPARE - Hydrologist (Medical): No    Lack of Transportation (Non-Medical): No  Physical Activity: Insufficiently Active (04/18/2021)   Exercise Vital Sign    Days of Exercise per Week: 1 day    Minutes of Exercise per Session: 120 min  Stress: No Stress Concern Present (05/07/2022)   [place]    Feeling of Stress : Not at all  Social Connections: Moderately Isolated (05/07/2022)   Social Connection and Isolation Panel [NHANES]    Frequency of Communication with Friends and Family: Never    Frequency of Social Gatherings with Friends and Family: Once a week    Attends Religious Services: More than 4 times per year    Active Member of [employer] or Organizations: No    Attends Archivist Meetings: Never    Marital Status: Married  Human resources officer Violence: Not At Risk (10/31/2022)   Humiliation, Afraid, Rape, and Kick  questionnaire    Fear of Current or Ex-Partner: No    Emotionally Abused: No    Physically Abused: No    Sexually Abused: No    Family History  Problem Relation Age of Onset   Cancer Mother        Melanoma skin cancer   Heart attack Father 74   Cancer Father        throat cancer   Arthritis Brother      No intake or output data in the 24 hours ending 02/25/23 1604  Vitals:   02/25/23 1330 02/25/23 1459 02/25/23 1500 02/25/23 1530  BP:  (!) 133/96 (!) 127/95 117/83  Pulse: 63  62 69  Resp: 17 17 13 18   Temp:      TempSrc:      SpO2: 98% 100% (!) 86% 90%  Weight:      Height:        PHYSICAL EXAM General: Pleasant conversational elderly Caucasian male, well nourished, in no acute distress. HEENT:  Normocephalic and atraumatic. Neck:  No JVD.  Lungs: Normal respiratory effort on 6 L by nasal cannula.  Bibasilar crackles.   Chest: L chest with tangerine sized area of swelling under incision for recent ICD battery change. Non tender to palpation, no erythema, warmth, or oozing from incision, steri strips intact.  Heart: Irregularly irregular with controlled rate. Normal S1 and S2 without gallops or murmurs.  Abdomen: Non-distended appearing s/p large-volume paracentesis, bandage present right lower quadrant..  Msk: Normal strength and tone for age. Extremities: Cool to touch.  No clubbing, cyanosis.  2-3+ bilateral lower extremity edema.  Neuro: Alert and oriented X 3. Psych:  Answers questions appropriately.   Labs: Basic Metabolic Panel: Recent Labs    02/25/23 0933  NA 134*  K 3.6  CL 98  CO2 24  GLUCOSE 114*  BUN 54*  CREATININE 3.06*  CALCIUM 8.7*   Liver Function Tests: Recent Labs    02/25/23 0933 02/25/23 1121  AST 37  --   ALT 13  --   ALKPHOS 174*  --   BILITOT 3.2* 3.0*  PROT 8.2*  --   ALBUMIN 3.4*  --    Recent Labs    02/25/23  0933  LIPASE 46   CBC: Recent Labs    02/25/23 0933  WBC 5.3  NEUTROABS 3.4  HGB 12.2*  HCT 39.6  MCV  102.1*  PLT 143*   Cardiac Enzymes: Recent Labs    02/25/23 0933 02/25/23 1121  TROPONINIHS 17 16   BNP: Recent Labs    02/25/23 0933  BNP 1,076.2*   D-Dimer: No results for input(s): "DDIMER" in the last 72 hours. Hemoglobin A1C: No results for input(s): "HGBA1C" in the last 72 hours. Fasting Lipid Panel: No results for input(s): "CHOL", "HDL", "LDLCALC", "TRIG", "CHOLHDL", "LDLDIRECT" in the last 72 hours. Thyroid Function Tests: No results for input(s): "TSH", "T4TOTAL", "T3FREE", "THYROIDAB" in the last 72 hours.  Invalid input(s): "FREET3" Anemia Panel: No results for input(s): "VITAMINB12", "FOLATE", "FERRITIN", "TIBC", "IRON", "RETICCTPCT" in the last 72 hours.   Radiology: CT CHEST ABDOMEN PELVIS WO CONTRAST  Result Date: 02/25/2023 CLINICAL DATA:  Severe shortness of breath. History of COPD. Sepsis EXAM: CT CHEST, ABDOMEN AND PELVIS WITHOUT CONTRAST TECHNIQUE: Multidetector CT imaging of the chest, abdomen and pelvis was performed following the standard protocol without IV contrast. RADIATION DOSE REDUCTION: This exam was performed according to the departmental dose-optimization program which includes automated exposure control, adjustment of the mA and/or kV according to patient size and/or use of iterative reconstruction technique. COMPARISON:  Abdomen pelvis CT 10/27/2022. Older exams. Chest x-ray earlier 02/25/2023 and older FINDINGS: CT CHEST FINDINGS Cardiovascular: Status post median sternotomy. There is a left chest battery pack with leads along the right side of the heart. The heart itself is enlarged. Trace pericardial fluid. On this non IV contrast exam, the thoracic aorta has a normal course and caliber with scattered vascular calcifications. Significant coronary artery calcifications are identified. Mediastinum/Nodes: Slightly patulous esophagus. Small thyroid gland. No specific abnormal lymph node enlargement seen in the axillary region. There are some calcified  nodes in the left hilum. There are some small mediastinal lymph nodes. These are slightly more numerous than usually seen but less than 1 cm in short axis and not pathologic by size criteria. Lungs/Pleura: There is a small right pleural effusion. There are areas of partially calcified pleural plaques bilaterally. No left-sided significant pleural effusion. Some breathing motion. No separate consolidation or pneumothorax. Healed multiple left-sided rib fractures. Musculoskeletal: Scattered degenerative changes of the spine. CT ABDOMEN PELVIS FINDINGS Hepatobiliary: On this non IV contrast exam, the liver is somewhat small. No obvious mass lesion. The gallbladder is nondilated. Pancreas: Moderate atrophy of the pancreas. Spleen: Spleen is nonenlarged. Adrenals/Urinary Tract: Adrenal glands are preserved. There is severe atrophy of the left kidney. No abnormal calcifications are seen within either kidney nor along the course of either ureter. Small hyperdense focus along the upper pole of the right kidney measuring 7 mm. This is unchanged from previous examination and could be a hyperdense lesion. This has been present since at least January 2021 demonstrating long-term stability. No additional follow-up. Stomach/Bowel: On this non oral contrast exam, large bowel has a normal course and caliber with scattered stool. Left-sided colonic diverticula. Stomach is collapsed. Small bowel is nondilated. Third portion duodenal diverticulum. Vascular/Lymphatic: Aortic endograft in place. Evaluation of the endograft is limited without the advantage of IV contrast but grossly preserved contour. Preserved IVC. No discrete abnormal lymph node enlargement identified in the abdomen and pelvis. Reproductive: Prominent prostate. Other: Moderate-to-large amount of ascites, new from previous. Diffuse mesenteric stranding and anasarca. There is also bilateral fat containing inguinal hernias. Fluid tracks along the right inguinal  canal.  Musculoskeletal: Curvature of the spine. Degenerative changes of the spine and pelvis. Multilevel stenosis. Particularly at L2-3 and L1-2. IMPRESSION: Moderate to severe ascites with anasarca mesenteric stranding. No bowel obstruction, free air.  Left-sided colonic diverticula. Aortic endograft. Severe atrophy of the left kidney. Small right pleural effusion. Bilateral partially calcified pleural plaques. Please correlate with any prior environmental exposure such as a specialist Postop chest with enlarged heart and defibrillator. Electronically Signed   By: Jill Side M.D.   On: 02/25/2023 11:22   DG Chest Portable 1 View  Result Date: 02/25/2023 CLINICAL DATA:  Shortness of breath, cough, COPD EXAM: PORTABLE CHEST 1 VIEW COMPARISON:  Portable exam 0930 hours compared to 02/24/2023 FINDINGS: LEFT subclavian ICD with leads projecting over RIGHT atrium and RIGHT ventricle. Enlargement of cardiac silhouette post CABG. Atherosclerotic calcification aorta. Minimal bibasilar atelectasis. Increased interstitial markings similar to previous exam question mild pulmonary edema. No pleural effusion or pneumothorax. IMPRESSION: Enlargement of cardiac silhouette post CABG and ICD with question mild pulmonary edema. Minimal bibasilar atelectasis. Aortic Atherosclerosis (ICD10-I70.0). Electronically Signed   By: Lavonia Dana M.D.   On: 02/25/2023 09:42   DG Chest 2 View  Result Date: 02/24/2023 CLINICAL DATA:  Shortness of breath. EXAM: CHEST - 2 VIEW COMPARISON:  January 03, 2023 FINDINGS: The heart size and mediastinal contours are stable. The heart size is enlarged. Cardiac pacemaker is unchanged. Increased interstitium is identified in bilateral lungs. There is no focal consolidation or pleural effusion. The visualized skeletal structures are stable. IMPRESSION: Mild congestive heart failure. Electronically Signed   By: Abelardo Diesel M.D.   On: 02/24/2023 09:12   EP PPM/ICD IMPLANT  Result Date:  02/13/2023 Successful dual-chamber ICD generator change    ECHO 02/17/2023   1. TEE TO ASSESS MV REGURGITATION.    2. NORMAL APPEARING MV WITH FUNCTIONAL MODERATE MR (PISA radius = 0.6 cm,    VC = 0.45 cm, EROA = 0.14 cm2, Regurg Volume = 23 mL). THERE IS BLUNTING    OF THE PULMONARY VEIN S WAVE WITHOUT REVERSAL.    3. SEVERE LV AND RV SYSTOLIC DYSFUNCTION EF = 20%.    4. MILD AR, TRIVIAL PR, MODERATE TR.    5. NO LAA THROMBUS PRESENT.   TELEMETRY reviewed by me (LT) 02/25/2023 : AF rate 60s-70s  EKG reviewed by me: AF nonspecific IVCD rate 77  Data reviewed by me (LT) 02/25/2023: last outpatient cardiology clinic note, ED note, IR note last 24h vitals tele labs imaging I/O   Active Problems:   CAD S/P percutaneous coronary angioplasty   Systolic CHF with reduced left ventricular function, NYHA class 3   Chronic kidney disease (CKD), stage III (moderate)   Essential (primary) hypertension   Atrial fibrillation   Acute on chronic respiratory failure with hypoxia   Ascites    ASSESSMENT AND PLAN:  Taylor [name] is an 83yoM with a PMH of chronic HFrEF (EF 20%, moderate MR 02/17/2023), CAD s/p CABG 08/2016, hx VF arrest s/p MI in 1994 s/p dual-chamber ICD (2014) and recent battery change out 02/13/2023, paroxysmal AF (Eliquis), CKD 4, AAA s/p EVAR (2008), COPD, DM2 who presented to [facility] ED 02/25/2023 with shortness of breath and associated cough, abdominal and leg swelling failing outpatient increase in diuretics.  Cardiology is consulted for assistance with his heart failure.  # Anasarca # Acute on chronic HFrEF # Moderate mitral regurgitation Presents with 2 weeks of progressive shortness of breath, peripheral edema,, abdominal distention, and decreased urine output,  refractory to addition of metolazone on top of his home diuretic regimen of torsemide 60 mg in the morning and 40 mg at night.  Recent dietary indiscretions of country ham, chicken pot pie (Easter dinner).  CT chest abdomen pelvis  with moderate to severe ascites with anasarca and mesenteric stranding with severe left kidney atrophy.  Clinical improvement following large-volume paracentesis of 6 L and initial IV diuresis.  Query underlying liver disease versus dietary indiscretions in the setting of chronic HFrEF and CKD 4 contributing to this exacerbation. -Continue IV diuresis with Lasix 60 mg IV twice daily -Agree with albumin infusion -Continue home GDMT of carvedilol, but reduce dose to 6.25 mg twice daily -Continue Imdur 15 mg daily, hold Jardiance in the setting of CKD as below -Salt and fluid restriction, strict I's/O -Defer repeat echo with recent study  # CKD4 Monitor closely with diuresis, renal function on admission with BUN/creatinine 54/3.06 and GFR of 20.  # CAD s/p CABG without chest pain # S/p ICD with recent battery change out 02/13/2023 # ?hematoma Chest pain-free on admission.  Continue aspirin and atorvastatin. Noted swelling under the pulse generator pocket incision. Likely hematoma formation post procedurally since on eliquis. Continue monitoring for now  # Paroxysmal atrial fibrillation Rate controlled on telemetry, continue Eliquis 2.5 mg twice daily for stroke prevention (age = 35, creatinine >1.5), Coreg for rate control.  This patient's plan of care was discussed and created with Dr. Saralyn [name] and he is in agreement.  Signed: Tristan Schroeder , PA-C 02/25/2023, 4:04 PM [facility]

## 2023-02-25 NOTE — ED Triage Notes (Signed)
Pt here with sever SOB this morning. Pt states he got up fine and then suddenly could not catch his breath. Pt also has a strong cough. Pt has a hx of COPD.

## 2023-02-25 NOTE — Assessment & Plan Note (Signed)
2d ECHO 10/2022 w 2D echocardiogram revealed LVEF less than 20% with global hypokinesis  Required IV Lasix drip during last admission for appropriate diuresis BNP 1076 today with pulmonary edema on chest x-ray Status post 80 mg IV Lasix in the ER Pending formal consultation with Dr. Saralyn Pilar for further evaluation

## 2023-02-25 NOTE — Assessment & Plan Note (Signed)
Baseline creatinine appears to be between 2.84. Creatinine 3 today with a GFR in the 20s Hold nephrotoxic agents when possible Formal consult with Dr. Juleen China for evaluation Follow

## 2023-02-25 NOTE — H&P (Addendum)
History and Physical    Patient: Taylor [name] [id number] DOB: September 15, 1941 DOA: 02/25/2023 DOS: the patient was seen and examined on 02/25/2023 PCP: Birdie Sons, MD  Patient coming from: Home  Chief Complaint:  Chief Complaint  Patient presents with   Shortness of Breath   HPI: Taylor [name] is a 82 y.o. male with medical history significant of HFrEF, Barrett esophagus, CAD s/p stent placement, s/p AICD, CKDIII, DDD, gout, bladder cancer, HLD, pacemaker placed, psoriasis, AAA, h/o CVA, HTN, ischemic cardiomyopathy, h/o ventricular fibrillation, restrictive lung disease, pleural nodule, h/o pancreatitis, Afib (on eliquis), Type II DM, carotid stenosis presenting with acute on chronic respiratory with hypoxia, ascites, elevated bilirubin.  Wife and patient report progressive increased work of breathing over multiple weeks.  Was evaluated at [facility] clinic cardiology yesterday.  On torsemide 40 mg daily.  Zaroxolyn 5 mg were added to her regimen to help with urine output.  Per report, patient with worsening shortness of breath.  Also with significant abdominal distention.  No reported NSAID use or high salt diet.  No chest pain.  No hemiparesis or confusion.  Urine output has been fairly stable though minimally improved with Zaroxolyn use.  Has had only 1 dose so far.  No hemiparesis or confusion.  No chest pain.  Patient reports being prescribed home oxygen in the past.  However, he does not wear it on a regular basis. Presented to the ER afebrile, hemodynamically stable.  O2 sats into the mid 80s with talking.  Transition to 5 to 6 L nasal cannula.  White count 5.3, hemoglobin 12.12, platelets 143, troponin negative x 2.  T. bili 3, creatinine 3.06, GFR 20, BNP 1076.  Chest x-ray with enlargement of cardiac silhouette and mild pulmonary edema.  CT of the chest abdomen pelvis with moderate to severe ascites with anasarca and mesenteric stranding. Review of Systems: As mentioned in the history of  present illness. All other systems reviewed and are negative. Past Medical History:  Diagnosis Date   AAA (abdominal aortic aneurysm) ([place]) 06/03/2007   [facility]; Dr. Kellie Simmering   AICD (automatic cardioverter/defibrillator) present    Arrhythmia    atrial fibrillation   Barrett's esophagus    Bladder cancer ([place])    Bradycardia    CAD (coronary artery disease)    CAP (community acquired pneumonia) 11/13/2019   CHF (congestive heart failure) ([place])    Cluster headache    COVID-19 09/27/2021   DDD (degenerative disc disease), lumbar    Diabetes mellitus without complication ([place])    Dyspnea    WITH EXERTION   Edema    LEFT ANKLE   Fracture of skull base ([place]) 1997   due to fall   GERD (gastroesophageal reflux disease)    Gout    History of bladder cancer 12/1995   Hyperlipidemia    Hypertension    Hypocalcemia 04/19/2020   Possibly secondary to diuretics.   Low=5.9 04/19/2020   Malignant melanoma ([place]) 12/2012   right dorsal forearm excised   Myocardial infarction [facility])    LAST 2014   Osteoarthritis of knee    Other specified complication of vascular prosthetic devices, implants and grafts, initial encounter ([place]) 08/22/2021   Pacemaker 10/10/2006   Pancreatitis 11/22/2019   Pneumonia    2016   Pre-diabetes    Psoriasis    Rib fracture 1997   due to fall   Sleep apnea    CPAP   Stroke ([facility])    Venous incompetence  Past Surgical History:  Procedure Laterality Date   ABDOMINAL AORTIC ANEURYSM REPAIR  06/03/2007   [facility]; Dr. Kellie Simmering   ANGIOPLASTY  1994   [place]   BLADDER TUMOR EXCISION  12/1995   CAROTID PTA/STENT INTERVENTION Left 07/23/2022   Procedure: CAROTID PTA/STENT INTERVENTION;  Surgeon: Katha Cabal, MD;  Location: [place] [facility];  Service: Cardiovascular;  Laterality: Left;   CATARACT EXTRACTION W/PHACO Left 10/22/2016   Procedure: CATARACT EXTRACTION PHACO AND INTRAOCULAR LENS PLACEMENT (IOC);   Surgeon: Birder Robson, MD;  Location: [facility] ORS;  Service: Ophthalmology;  Laterality: Left;  [place] 47.7 AP% 18.4 CDE 8.78 Fluid pack lot # [id number]   CATARACT EXTRACTION W/PHACO Right 12/10/2016   Procedure: CATARACT EXTRACTION PHACO AND INTRAOCULAR LENS PLACEMENT (IOC);  Surgeon: Birder Robson, MD;  Location: [facility] ORS;  Service: Ophthalmology;  Laterality: Right;  [place] 00:39 AP% 23.3 CDE 9.13 Fluid pack lot # [id number]   CORONARY ANGIOPLASTY     STENTS X 5   CORONARY ARTERY BYPASS GRAFT  09/22/2006   four   ELBOW BURSA SURGERY     DUE TO GOUT   INSERT / REPLACE / REMOVE PACEMAKER     MELANOMA EXCISION  12/2012   Right forearm   PPM GENERATOR CHANGEOUT N/A 02/13/2023   Procedure: PPM GENERATOR CHANGEOUT;  Surgeon: Isaias Cowman, MD;  Location: [place] [facility];  Service: Cardiovascular;  Laterality: N/A;   Social History:  reports that he quit smoking about 23 years ago. His smoking use included cigarettes. He smoked an average of 1 pack per day. He has never used smokeless tobacco. He reports that he does not drink alcohol and does not use drugs.  Allergies  Allergen Reactions   Amlodipine Besylate Swelling    Had a reaction when taking with colcrys    Crestor [Rosuvastatin]     Muscle cramps and pain   Rocephin [Ceftriaxone]     unknown    Family History  Problem Relation Age of Onset   Cancer Mother        Melanoma skin cancer   Heart attack Father 55   Cancer Father        throat cancer   Arthritis Brother     Prior to Admission medications   Medication Sig Start Date End Date Taking? Authorizing Provider  albuterol (VENTOLIN HFA) 108 (90 Base) MCG/ACT inhaler Inhale 2 puffs into the lungs every 6 (six) hours as needed for wheezing or shortness of breath. 01/03/23  Yes Birdie Sons, MD  allopurinol (ZYLOPRIM) 300 MG tablet Take 0.5 tablets (150 mg total) by mouth daily. 09/25/22  Yes Birdie Sons, MD  apixaban (ELIQUIS) 2.5 MG TABS tablet Take 2  tablets (5 mg total) by mouth 2 (two) times daily. 11/02/22 02/25/23 Yes Richarda Osmond, MD  aspirin 81 MG EC tablet Take 81 mg by mouth daily.    Yes [provider]  atorvastatin (LIPITOR) 40 MG tablet TAKE 1 TABLET(40 MG) BY MOUTH DAILY 12/30/22  Yes Birdie Sons, MD  benzonatate (TESSALON) 200 MG capsule Take by mouth. 02/13/23  Yes [provider]  carvedilol (COREG) 12.5 MG tablet Take 12.5 mg by mouth 2 (two) times daily with a meal. Daily   Yes [provider]  clarithromycin (BIAXIN XL) 500 MG 24 hr tablet Take 2 tablets (1,000 mg total) by mouth daily for 14 days. 02/13/23 02/27/23 Yes Paraschos, Alexander, MD  empagliflozin (JARDIANCE) 10 MG TABS tablet Take by mouth daily.  Yes [provider]  ipratropium-albuterol (DUONEB) 0.5-2.5 (3) MG/3ML SOLN Inhale into the lungs. 01/27/23 04/27/23 Yes [provider]  isosorbide mononitrate (IMDUR) 30 MG 24 hr tablet TAKE 1/2 TABLET(15 MG) BY MOUTH DAILY 11/28/22  Yes Birdie Sons, MD  losartan (COZAAR) 50 MG tablet Take 50 mg by mouth daily. 11/28/22  Yes [provider]  Magnesium 200 MG TABS Take 1 tablet (200 mg total) by mouth in the morning and at bedtime. 11/11/22  Yes Hackney, Tina A, FNP  metolazone (ZAROXOLYN) 5 MG tablet Take by mouth. 02/24/23 02/24/24 Yes [provider]  pantoprazole (PROTONIX) 40 MG tablet TAKE 1 TABLET BY MOUTH EVERY DAY 11/21/22  Yes Birdie Sons, MD  potassium chloride SA (KLOR-CON M) 20 MEQ tablet Take 20 mEq by mouth daily.   Yes [provider]  torsemide (DEMADEX) 20 MG tablet Take 40 mg by mouth daily. 09/04/20  Yes [provider]  triamcinolone ointment (KENALOG) 0.1 % APPLY TOPICALLY TWICE DAILY AS DIRECTED 07/15/22  Yes Birdie Sons, MD    Physical Exam: Vitals:   02/25/23 1324 02/25/23 1325 02/25/23 1326 02/25/23 1330  BP:      Pulse: (!) 127  (!) 147 63  Resp: (!) 23 (!) 22 18 17   Temp:      TempSrc:      SpO2:  93%  (!) 81% 98%  Weight:      Height:       Physical Exam Constitutional:      Appearance: He is obese.     Comments: Mild to moderate increased work of breathing  HENT:     Head: Normocephalic and atraumatic.  Eyes:     Pupils: Pupils are equal, round, and reactive to light.  Cardiovascular:     Rate and Rhythm: Normal rate and regular rhythm.  Pulmonary:     Comments: Positive mild increased work of breathing Abdominal:     General: Bowel sounds are normal.     Comments: Marked abdominal distention  Musculoskeletal:        General: Normal range of motion.     Cervical back: Normal range of motion and neck supple.  Skin:    General: Skin is warm.  Neurological:     General: No focal deficit present.  Psychiatric:        Mood and Affect: Mood normal.     Data Reviewed:  There are no new results to review at this time. CT CHEST ABDOMEN PELVIS WO CONTRAST CLINICAL DATA:  Severe shortness of breath. History of COPD. Sepsis  EXAM: CT CHEST, ABDOMEN AND PELVIS WITHOUT CONTRAST  TECHNIQUE: Multidetector CT imaging of the chest, abdomen and pelvis was performed following the standard protocol without IV contrast.  RADIATION DOSE REDUCTION: This exam was performed according to the departmental dose-optimization program which includes automated exposure control, adjustment of the mA and/or kV according to patient size and/or use of iterative reconstruction technique.  COMPARISON:  Abdomen pelvis CT 10/27/2022. Older exams. Chest x-ray earlier 02/25/2023 and older  FINDINGS: CT CHEST FINDINGS  Cardiovascular: Status post median sternotomy. There is a left chest battery pack with leads along the right side of the heart. The heart itself is enlarged. Trace pericardial fluid. On this non IV contrast exam, the thoracic aorta has a normal course and caliber with scattered vascular calcifications. Significant coronary artery calcifications are  identified.  Mediastinum/Nodes: Slightly patulous esophagus. Small thyroid gland. No specific abnormal lymph node enlargement seen in the axillary  region. There are some calcified nodes in the left hilum. There are some small mediastinal lymph nodes. These are slightly more numerous than usually seen but less than 1 cm in short axis and not pathologic by size criteria.  Lungs/Pleura: There is a small right pleural effusion. There are areas of partially calcified pleural plaques bilaterally. No left-sided significant pleural effusion. Some breathing motion. No separate consolidation or pneumothorax. Healed multiple left-sided rib fractures.  Musculoskeletal: Scattered degenerative changes of the spine.  CT ABDOMEN PELVIS FINDINGS  Hepatobiliary: On this non IV contrast exam, the liver is somewhat small. No obvious mass lesion. The gallbladder is nondilated.  Pancreas: Moderate atrophy of the pancreas.  Spleen: Spleen is nonenlarged.  Adrenals/Urinary Tract: Adrenal glands are preserved. There is severe atrophy of the left kidney. No abnormal calcifications are seen within either kidney nor along the course of either ureter. Small hyperdense focus along the upper pole of the right kidney measuring 7 mm. This is unchanged from previous examination and could be a hyperdense lesion. This has been present since at least January 2021 demonstrating long-term stability. No additional follow-up.  Stomach/Bowel: On this non oral contrast exam, large bowel has a normal course and caliber with scattered stool. Left-sided colonic diverticula. Stomach is collapsed. Small bowel is nondilated. Third portion duodenal diverticulum.  Vascular/Lymphatic: Aortic endograft in place. Evaluation of the endograft is limited without the advantage of IV contrast but grossly preserved contour. Preserved IVC. No discrete abnormal lymph node enlargement identified in the abdomen and  pelvis.  Reproductive: Prominent prostate.  Other: Moderate-to-large amount of ascites, new from previous. Diffuse mesenteric stranding and anasarca. There is also bilateral fat containing inguinal hernias. Fluid tracks along the right inguinal canal.  Musculoskeletal: Curvature of the spine. Degenerative changes of the spine and pelvis. Multilevel stenosis. Particularly at L2-3 and L1-2.  IMPRESSION: Moderate to severe ascites with anasarca mesenteric stranding.  No bowel obstruction, free air.  Left-sided colonic diverticula.  Aortic endograft.  Severe atrophy of the left kidney.  Small right pleural effusion.  Bilateral partially calcified pleural plaques. Please correlate with any prior environmental exposure such as a specialist  Postop chest with enlarged heart and defibrillator.  Electronically Signed   By: Jill Side M.D.   On: 02/25/2023 11:22 DG Chest Portable 1 View CLINICAL DATA:  Shortness of breath, cough, COPD  EXAM: PORTABLE CHEST 1 VIEW  COMPARISON:  Portable exam 0930 hours compared to 02/24/2023  FINDINGS: LEFT subclavian ICD with leads projecting over RIGHT atrium and RIGHT ventricle.  Enlargement of cardiac silhouette post CABG.  Atherosclerotic calcification aorta.  Minimal bibasilar atelectasis.  Increased interstitial markings similar to previous exam question mild pulmonary edema.  No pleural effusion or pneumothorax.  IMPRESSION: Enlargement of cardiac silhouette post CABG and ICD with question mild pulmonary edema.  Minimal bibasilar atelectasis.  Aortic Atherosclerosis (ICD10-I70.0).  Electronically Signed   By: Lavonia Dana M.D.   On: 02/25/2023 09:42  Lab Results  Component Value Date   WBC 5.3 02/25/2023   HGB 12.2 (L) 02/25/2023   HCT 39.6 02/25/2023   MCV 102.1 (H) 02/25/2023   PLT 143 (L) [id number]   Last metabolic panel Lab Results  Component Value Date   GLUCOSE 114 (H) 02/25/2023   NA 134 (L)  02/25/2023   K 3.6 02/25/2023   CL 98 02/25/2023   CO2 24 02/25/2023   BUN 54 (H) 02/25/2023   CREATININE 3.06 (H) 02/25/2023   GFRNONAA 20 (L) 02/25/2023   CALCIUM 8.7 (  L) 02/25/2023   PHOS 3.6 02/23/2021   PROT 8.2 (H) 02/25/2023   ALBUMIN 3.4 (L) 02/25/2023   LABGLOB 3.2 01/03/2023   AGRATIO 1.1 (L) 01/03/2023   BILITOT 3.0 (H) 02/25/2023   ALKPHOS 174 (H) 02/25/2023   AST 37 02/25/2023   ALT 13 02/25/2023   ANIONGAP 12 02/25/2023    Assessment and Plan: * Acute on chronic respiratory failure with hypoxia O2 requirement of roughly 5 to 6 L in the setting of CHF and ascites Patient reports home oxygen prescription that he does not use Chest x-ray with pulmonary edema CT of the abdomen pelvis showing marked ascites and mesenteric edema Status post 80 mg of IV Lasix in ER Pending paracentesis Noted creatinine of 3 with GFR in the 20s Suspect volume overload in setting of CHF with poor renal function as likely etiology Will monitor respiratory and volume status Patient followed at [facility] clinic outpatient-they have been notified for consult Will also reach out to Dr. Juleen China with nephrology for input. Follow closely  Ascites Noted marked ascites with mesenteric edema on imaging in setting of CHF and chronic kidney disease Suspect significant hepatic congestion with third spacing LFT stable Bilirubin stable at 3.2 Pending paracentesis   Chronic kidney disease (CKD), stage III (moderate) Baseline creatinine appears to be between 2.84. Creatinine 3 today with a GFR in the 20s Hold nephrotoxic agents when possible Formal consult with Dr. Juleen China for evaluation Follow  Systolic CHF with reduced left ventricular function, NYHA class 3 2d ECHO 10/2022 w 2D echocardiogram revealed LVEF less than 20% with global hypokinesis  Required IV Lasix drip during last admission for appropriate diuresis BNP 1076 today with pulmonary edema on chest x-ray Status post 80 mg IV Lasix  in the ER Pending formal consultation with Dr. Saralyn Pilar for further evaluation   Hyperbilirubinemia T bili 3.2 today  Baseline appears to be around 2.2-2.6  LFTs WNL Checking fractionated bilirubin  No GB disease on imaging  ?Suspect vascular congestion in setting of ascites Monitor-reassess as clinically appropriate   Atrial fibrillation Rate controlled atrial fibrillation on EKG Continue home regimen  Follow-up cardiology recommendations  Essential (primary) hypertension BP stable Titrate home regimen with diuresis  CAD S/P percutaneous coronary angioplasty No active chest pain Troponin negative x 2 EKG with stable atrial fibrillation, heart rate in the 70s       Advance Care Planning:   Code Status: Full Code   Consults: Cardiology w/ Dr. Saralyn Pilar, Nephrology w/ Dr. Juleen China  Family Communication: Wife at the bedside   Severity of Illness: The appropriate patient status for this patient is INPATIENT. Inpatient status is judged to be reasonable and necessary in order to provide the required intensity of service to ensure the patient's safety. The patient's presenting symptoms, physical exam findings, and initial radiographic and laboratory data in the context of their chronic comorbidities is felt to place them at high risk for further clinical deterioration. Furthermore, it is not anticipated that the patient will be medically stable for discharge from the hospital within 2 midnights of admission.   * I certify that at the point of admission it is my clinical judgment that the patient will require inpatient hospital care spanning beyond 2 midnights from the point of admission due to high intensity of service, high risk for further deterioration and high frequency of surveillance required.*  Author: Deneise Lever, MD 02/25/2023 2:21 PM  For on call review [email or web address].

## 2023-02-25 NOTE — Assessment & Plan Note (Signed)
Rate controlled atrial fibrillation on EKG Continue home regimen  Follow-up cardiology recommendations

## 2023-02-25 NOTE — Assessment & Plan Note (Signed)
Noted marked ascites with mesenteric edema on imaging in setting of CHF and chronic kidney disease Suspect significant hepatic congestion with third spacing LFT stable Bilirubin stable at 3.2 Pending paracentesis

## 2023-02-25 NOTE — Procedures (Signed)
PROCEDURE SUMMARY:  Successful image-guided paracentesis from the right lower abdomen.  Yielded 6 liters of amber fluid.  No immediate complications.  EBL = trace. Patient tolerated well.   Specimen was sent for labs.  Please see imaging section of Epic for full dictation.   Lura Em PA-C 02/25/2023 4:09 PM

## 2023-02-25 NOTE — Assessment & Plan Note (Signed)
BP stable Titrate home regimen with diuresis 

## 2023-02-25 NOTE — Telephone Encounter (Signed)
  Chief Complaint: Short of breath Symptoms: Speaking in short phrases. Thinks this may be a fluid build up Frequency: this morning, has happened in the past Pertinent Negatives: Patient denies  Disposition: [x] ED /[] Urgent Care (no appt availability in office) / [] Appointment(In office/virtual)/ []  [facility]/ [] Home Care/ [] Refused Recommended Disposition /[] [facility]/ []  Follow-up with PCP Additional Notes: PT states he did not sleep well last night. This morning is having SOB. HE cannot lay down or he can't breath. PT will go to ED.    Reason for Disposition  [1] MODERATE difficulty breathing (e.g., speaks in phrases, SOB even at rest, pulse 100-120) AND [2] NEW-onset or WORSE than normal  Answer Assessment - Initial Assessment Questions 1. RESPIRATORY STATUS: "Describe your breathing?" (e.g., wheezing, shortness of breath, unable to speak, severe coughing)      Shortness 2. ONSET: "When did this breathing problem begin?"      This morning 3. PATTERN "Does the difficult breathing come and go, or has it been constant since it started?"      Constant 4. SEVERITY: "How bad is your breathing?" (e.g., mild, moderate, severe)    - MILD: No SOB at rest, mild SOB with walking, speaks normally in sentences, can lie down, no retractions, pulse < 100.    - MODERATE: SOB at rest, SOB with minimal exertion and prefers to sit, cannot lie down flat, speaks in phrases, mild retractions, audible wheezing, pulse 100-120.    - SEVERE: Very SOB at rest, speaks in single words, struggling to breathe, sitting hunched forward, retractions, pulse > 120      moderate 5. RECURRENT SYMPTOM: "Have you had difficulty breathing before?" If Yes, ask: "When was the last time?" and "What happened that time?"      yes 6. CARDIAC HISTORY: "Do you have any history of heart disease?" (e.g., heart attack, angina, bypass surgery, angioplasty)      yes 7. LUNG HISTORY: "Do you have any history of  lung disease?"  (e.g., pulmonary embolus, asthma, emphysema)      8. CAUSE: "What do you think is causing the breathing problem?"      Unsure - fluid  9. OTHER SYMPTOMS: "Do you have any other symptoms? (e.g., dizziness, runny nose, cough, chest pain, fever)      10. O2 SATURATION MONITOR:  "Do you use an oxygen saturation monitor (pulse oximeter) at home?" If Yes, ask: "What is your reading (oxygen level) today?" "What is your usual oxygen saturation reading?" (e.g., 95%)  Protocols used: Breathing Difficulty-A-AH

## 2023-02-25 NOTE — Assessment & Plan Note (Signed)
No active chest pain Troponin negative x 2 EKG with stable atrial fibrillation, heart rate in the 70s

## 2023-02-25 NOTE — Assessment & Plan Note (Addendum)
T bili 3.2 today  Baseline appears to be around 2.2-2.6  LFTs WNL Checking fractionated bilirubin  No GB disease on imaging  ?Suspect vascular congestion in setting of ascites Monitor-reassess as clinically appropriate

## 2023-02-26 DIAGNOSIS — J9621 Acute and chronic respiratory failure with hypoxia: Secondary | ICD-10-CM | POA: Diagnosis not present

## 2023-02-26 LAB — COMPREHENSIVE METABOLIC PANEL
ALT: 10 U/L (ref 0–44)
AST: 27 U/L (ref 15–41)
Albumin: 2.8 g/dL — ABNORMAL LOW (ref 3.5–5.0)
Alkaline Phosphatase: 109 U/L (ref 38–126)
Anion gap: 9 (ref 5–15)
BUN: 53 mg/dL — ABNORMAL HIGH (ref 8–23)
CO2: 26 mmol/L (ref 22–32)
Calcium: 9 mg/dL (ref 8.9–10.3)
Chloride: 100 mmol/L (ref 98–111)
Creatinine, Ser: 3.05 mg/dL — ABNORMAL HIGH (ref 0.61–1.24)
GFR, Estimated: 20 mL/min — ABNORMAL LOW (ref >60)
Glucose, Bld: 92 mg/dL (ref 70–99)
Potassium: 3.7 mmol/L (ref 3.5–5.1)
Sodium: 135 mmol/L (ref 135–145)
Total Bilirubin: 3.2 mg/dL — ABNORMAL HIGH (ref 0.3–1.2)
Total Protein: 6.5 g/dL (ref 6.5–8.1)

## 2023-02-26 LAB — CBC
HCT: 37.5 % — ABNORMAL LOW (ref 39.0–52.0)
Hemoglobin: 11.9 g/dL — ABNORMAL LOW (ref 13.0–17.0)
MCH: 31.1 pg (ref 26.0–34.0)
MCHC: 31.7 g/dL (ref 30.0–36.0)
MCV: 97.9 fL (ref 80.0–100.0)
Platelets: 123 10*3/uL — ABNORMAL LOW (ref 150–400)
RBC: 3.83 MIL/uL — ABNORMAL LOW (ref 4.22–5.81)
RDW: 20 % — ABNORMAL HIGH (ref 11.5–15.5)
WBC: 4.5 10*3/uL (ref 4.0–10.5)
nRBC: 0 % (ref 0.0–0.2)

## 2023-02-26 LAB — TRIGLYCERIDES, BODY FLUIDS: Triglycerides, Fluid: 37 mg/dL

## 2023-02-26 LAB — PATHOLOGIST SMEAR REVIEW

## 2023-02-26 MED ORDER — POTASSIUM CHLORIDE CRYS ER 20 MEQ PO TBCR
40.0000 meq | EXTENDED_RELEASE_TABLET | Freq: Once | ORAL | Status: AC
  Administered 2023-02-26: 40 meq via ORAL
  Filled 2023-02-26: qty 2

## 2023-02-26 NOTE — Progress Notes (Signed)
[facility]  ROUNDING NOTE   Subjective:   Taylor [name] is a 82 year old male with past medical conditions including CAD, AICD, gout, bladder cancer, AAA, CVA, hypertension, Barrett's esophagus, ischemic cardiomyopathy, gout, and chronic kidney disease stage III.  Patient presents to the emergency department complaining of shortness of breath.  Patient has been admitted for Other ascites [R18.8] Acute respiratory failure with hypoxia [J96.01] Congestive heart failure, unspecified HF chronicity, unspecified heart failure type [I50.9]  Patient is known to our practice and is followed outpatient by Dr.Korrapati.  He was last seen in office on 01/28/2023 for routine follow-up.  Patient is seen and evaluated at bedside.  He states he is taking all medications as prescribed.  Reports only taken 1 dose of the metolazone yesterday.  Reports progressive shortness of breath over the past few weeks.  Patient states he has oxygen at home and uses as needed.  Patient currently on 6 L nasal cannula.  Mild lower extremity edema noted.  Denies nausea, vomiting, or diarrhea.  Labs on ED arrival significant for sodium 134, BUN 54, creatinine 3.06 with GFR 20, BNP 1076, troponin 17, and hemoglobin 17.2.  Baseline creatinine appears to be 2.2 with GFR 30 on 01/13/2023.  Respiratory panel negative for influenza, COVID-19, and RSV.  UA appears clear.  Chest x-ray shows mild pulmonary edema with bibasilar atelectasis.  Chest CT shows moderate to severe ascites.Paracentesis performed that yielded 6 L amber-colored fluid.  We have been consulted to manage acute kidney injury.   Objective:  Vital signs in last 24 hours:  Temp:  [97.5 F (36.4 C)-99.5 F (37.5 C)] 98 F (36.7 C) (04/03 1154) Pulse Rate:  [39-112] 56 (04/03 1154) Resp:  [13-31] 19 (04/03 1154) BP: (102-133)/(76-96) 109/76 (04/03 1154) SpO2:  [75 %-100 %] 100 % (04/03 1317) Weight:  [80.9 kg-82.6 kg] 80.9 kg (04/03 0435)  Weight change:   Filed Weights   02/25/23 0917 02/25/23 2116 02/26/23 0435  Weight: 89.4 kg 82.6 kg 80.9 kg    Intake/Output: I/O last 3 completed shifts: In: 740.1 [P.O.:240; IV Piggyback:500.1] Out: [id number]   Intake/Output this shift:  Total I/O In: 240 [P.O.:240] Out: 1455 [Urine:1455]  Physical Exam: General: NAD  Head: Normocephalic, atraumatic. Moist oral mucosal membranes  Eyes: Anicteric  Lungs:  Right basilar crackles, 6 L [place]  Heart: Regular rate and rhythm  Abdomen:  Soft, nontender,   Extremities: 1+ peripheral edema.  Neurologic: Nonfocal, moving all four extremities  Skin: No lesions  Access: None    Basic Metabolic Panel: Recent Labs  Lab 02/25/23 0933 02/26/23 0538  NA 134* 135  K 3.6 3.7  CL 98 100  CO2 24 26  GLUCOSE 114* 92  BUN 54* 53*  CREATININE 3.06* 3.05*  CALCIUM 8.7* 9.0    Liver Function Tests: Recent Labs  Lab 02/25/23 0933 02/25/23 1121 02/26/23 0538  AST 37  --  27  ALT 13  --  10  ALKPHOS 174*  --  109  BILITOT 3.2* 3.0* 3.2*  PROT 8.2*  --  6.5  ALBUMIN 3.4*  --  2.8*   Recent Labs  Lab 02/25/23 0933  LIPASE 46   No results for input(s): "AMMONIA" in the last 168 hours.  CBC: Recent Labs  Lab 02/25/23 0933 02/26/23 0538  WBC 5.3 4.5  NEUTROABS 3.4  --   HGB 12.2* 11.9*  HCT 39.6 37.5*  MCV 102.1* 97.9  PLT 143* 123*    Cardiac Enzymes: No results for  input(s): "CKTOTAL", "CKMB", "CKMBINDEX", "TROPONINI" in the last 168 hours.  BNP: Invalid input(s): "POCBNP"  CBG: No results for input(s): "GLUCAP" in the last 168 hours.  Microbiology: Results for orders placed or performed during the hospital encounter of 02/25/23  Resp panel by RT-PCR (RSV, Flu A&B, Covid) Anterior Nasal Swab     Status: None   Collection Time: 02/25/23  9:33 AM   Specimen: Anterior Nasal Swab  Result Value Ref Range Status   SARS Coronavirus 2 by RT PCR NEGATIVE NEGATIVE Final    Comment: (NOTE) SARS-CoV-2 target nucleic acids are  NOT DETECTED.  The SARS-CoV-2 RNA is generally detectable in upper respiratory specimens during the acute phase of infection. The lowest concentration of SARS-CoV-2 viral copies this assay can detect is 138 copies/mL. A negative result does not preclude SARS-Cov-2 infection and should not be used as the sole basis for treatment or other patient management decisions. A negative result may occur with  improper specimen collection/handling, submission of specimen other than nasopharyngeal swab, presence of viral mutation(s) within the areas targeted by this assay, and inadequate number of viral copies(<138 copies/mL). A negative result must be combined with clinical observations, patient history, and epidemiological information. The expected result is Negative.  Fact Sheet for Patients:  EntrepreneurPulse.com.au  Fact Sheet for Healthcare Providers:  IncredibleEmployment.be  This test is no t yet approved or cleared by the [place] FDA and  has been authorized for detection and/or diagnosis of SARS-CoV-2 by FDA under an Emergency Use Authorization (EUA). This EUA will remain  in effect (meaning this test can be used) for the duration of the COVID-19 declaration under Section 564(b)(1) of the Act, 21 U.S.C.section 360bbb-3(b)(1), unless the authorization is terminated  or revoked sooner.       Influenza A by PCR NEGATIVE NEGATIVE Final   Influenza B by PCR NEGATIVE NEGATIVE Final    Comment: (NOTE) The Xpert Xpress SARS-CoV-2/FLU/RSV plus assay is intended as an aid in the diagnosis of influenza from Nasopharyngeal swab specimens and should not be used as a sole basis for treatment. Nasal washings and aspirates are unacceptable for Xpert Xpress SARS-CoV-2/FLU/RSV testing.  Fact Sheet for Patients: EntrepreneurPulse.com.au  Fact Sheet for Healthcare Providers: IncredibleEmployment.be  This test is not  yet approved or cleared by the [place] FDA and has been authorized for detection and/or diagnosis of SARS-CoV-2 by FDA under an Emergency Use Authorization (EUA). This EUA will remain in effect (meaning this test can be used) for the duration of the COVID-19 declaration under Section 564(b)(1) of the Act, 21 U.S.C. section 360bbb-3(b)(1), unless the authorization is terminated or revoked.     Resp Syncytial Virus by PCR NEGATIVE NEGATIVE Final    Comment: (NOTE) Fact Sheet for Patients: EntrepreneurPulse.com.au  Fact Sheet for Healthcare Providers: IncredibleEmployment.be  This test is not yet approved or cleared by the [place] FDA and has been authorized for detection and/or diagnosis of SARS-CoV-2 by FDA under an Emergency Use Authorization (EUA). This EUA will remain in effect (meaning this test can be used) for the duration of the COVID-19 declaration under Section 564(b)(1) of the Act, 21 U.S.C. section 360bbb-3(b)(1), unless the authorization is terminated or revoked.  Performed at [facility], [address] [place], [place] [address]   Body fluid culture w Gram Stain     Status: None (Preliminary result)   Collection Time: 02/25/23  3:07 PM   Specimen: PATH Cytology Peritoneal fluid  Result Value Ref Range Status   Specimen  Description   Final    FLUID Performed at [facility], [address] [place], [place] [address]    Special Requests   Final    CYTO PERI Performed at [facility], [address] [place], [place] [address]    Gram Stain   Final    FEW WBC PRESENT, PREDOMINANTLY MONONUCLEAR NO ORGANISMS SEEN    Culture   Final    NO GROWTH < 24 HOURS Performed at [facility] [address] [place], [place] [address]    Report Status PENDING  Incomplete    Coagulation Studies: No results for input(s): "LABPROT", "INR" in the last 72 hours.  Urinalysis: Recent  Labs    02/25/23 0933  COLORURINE YELLOW*  LABSPEC 1.006  PHURINE 5.0  GLUCOSEU 50*  HGBUR NEGATIVE  BILIRUBINUR NEGATIVE  KETONESUR NEGATIVE  PROTEINUR NEGATIVE  NITRITE NEGATIVE  LEUKOCYTESUR NEGATIVE      Imaging: US Paracentesis  Result Date: 02/25/2023 INDICATION: CHF exacerbation with ascites EXAM: ULTRASOUND GUIDED diagnostic and therapeutic PARACENTESIS MEDICATIONS: 5 cc 1% lidocaine COMPLICATIONS: None immediate. PROCEDURE: Informed written consent was obtained from the patient after a discussion of the risks, benefits and alternatives to treatment. A timeout was performed prior to the initiation of the procedure. Initial ultrasound scanning demonstrates a large amount of ascites within the right lower abdominal quadrant. The right lower abdomen was prepped and draped in the usual sterile fashion. 1% lidocaine was used for local anesthesia. Following this, a 19 gauge, 7-cm, Yueh catheter was introduced. An ultrasound image was saved for documentation purposes. The paracentesis was performed. The catheter was removed and a dressing was applied. The patient tolerated the procedure well without immediate post procedural complication. Patient received post-procedure intravenous albumin; see nursing notes for details. FINDINGS: A total of approximately 6 L of amber fluid was removed. Samples were sent to the laboratory as requested by the clinical team. IMPRESSION: Successful ultrasound-guided paracentesis yielding 6 liters of peritoneal fluid. PLAN: If the patient eventually requires >/=2 paracenteses in a 30 day period, candidacy for formal evaluation by the [facility] will be assessed. Read by: Reatha Armour, PA-C Electronically Signed   By: Corrie Mckusick D.O.   On: 02/25/2023 16:14   CT CHEST ABDOMEN PELVIS WO CONTRAST  Result Date: 02/25/2023 CLINICAL DATA:  Severe shortness of breath. History of COPD. Sepsis EXAM: CT CHEST, ABDOMEN  AND PELVIS WITHOUT CONTRAST TECHNIQUE: Multidetector CT imaging of the chest, abdomen and pelvis was performed following the standard protocol without IV contrast. RADIATION DOSE REDUCTION: This exam was performed according to the departmental dose-optimization program which includes automated exposure control, adjustment of the mA and/or kV according to patient size and/or use of iterative reconstruction technique. COMPARISON:  Abdomen pelvis CT 10/27/2022. Older exams. Chest x-ray earlier 02/25/2023 and older FINDINGS: CT CHEST FINDINGS Cardiovascular: Status post median sternotomy. There is a left chest battery pack with leads along the right side of the heart. The heart itself is enlarged. Trace pericardial fluid. On this non IV contrast exam, the thoracic aorta has a normal course and caliber with scattered vascular calcifications. Significant coronary artery calcifications are identified. Mediastinum/Nodes: Slightly patulous esophagus. Small thyroid gland. No specific abnormal lymph node enlargement seen in the axillary region. There are some calcified nodes in the left hilum. There are some small mediastinal lymph nodes. These are slightly more numerous than usually seen but less than 1 cm in short axis and not pathologic by size criteria.  Lungs/Pleura: There is a small right pleural effusion. There are areas of partially calcified pleural plaques bilaterally. No left-sided significant pleural effusion. Some breathing motion. No separate consolidation or pneumothorax. Healed multiple left-sided rib fractures. Musculoskeletal: Scattered degenerative changes of the spine. CT ABDOMEN PELVIS FINDINGS Hepatobiliary: On this non IV contrast exam, the liver is somewhat small. No obvious mass lesion. The gallbladder is nondilated. Pancreas: Moderate atrophy of the pancreas. Spleen: Spleen is nonenlarged. Adrenals/Urinary Tract: Adrenal glands are preserved. There is severe atrophy of the left kidney. No abnormal  calcifications are seen within either kidney nor along the course of either ureter. Small hyperdense focus along the upper pole of the right kidney measuring 7 mm. This is unchanged from previous examination and could be a hyperdense lesion. This has been present since at least January 2021 demonstrating long-term stability. No additional follow-up. Stomach/Bowel: On this non oral contrast exam, large bowel has a normal course and caliber with scattered stool. Left-sided colonic diverticula. Stomach is collapsed. Small bowel is nondilated. Third portion duodenal diverticulum. Vascular/Lymphatic: Aortic endograft in place. Evaluation of the endograft is limited without the advantage of IV contrast but grossly preserved contour. Preserved IVC. No discrete abnormal lymph node enlargement identified in the abdomen and pelvis. Reproductive: Prominent prostate. Other: Moderate-to-large amount of ascites, new from previous. Diffuse mesenteric stranding and anasarca. There is also bilateral fat containing inguinal hernias. Fluid tracks along the right inguinal canal. Musculoskeletal: Curvature of the spine. Degenerative changes of the spine and pelvis. Multilevel stenosis. Particularly at L2-3 and L1-2. IMPRESSION: Moderate to severe ascites with anasarca mesenteric stranding. No bowel obstruction, free air.  Left-sided colonic diverticula. Aortic endograft. Severe atrophy of the left kidney. Small right pleural effusion. Bilateral partially calcified pleural plaques. Please correlate with any prior environmental exposure such as a specialist Postop chest with enlarged heart and defibrillator. Electronically Signed   By: Jill Side M.D.   On: 02/25/2023 11:22   DG Chest Portable 1 View  Result Date: 02/25/2023 CLINICAL DATA:  Shortness of breath, cough, COPD EXAM: PORTABLE CHEST 1 VIEW COMPARISON:  Portable exam 0930 hours compared to 02/24/2023 FINDINGS: LEFT subclavian ICD with leads projecting over RIGHT atrium and  RIGHT ventricle. Enlargement of cardiac silhouette post CABG. Atherosclerotic calcification aorta. Minimal bibasilar atelectasis. Increased interstitial markings similar to previous exam question mild pulmonary edema. No pleural effusion or pneumothorax. IMPRESSION: Enlargement of cardiac silhouette post CABG and ICD with question mild pulmonary edema. Minimal bibasilar atelectasis. Aortic Atherosclerosis (ICD10-I70.0). Electronically Signed   By: Lavonia Dana M.D.   On: 02/25/2023 09:42     Medications:     apixaban  2.5 mg Oral BID   aspirin  81 mg Oral Daily   atorvastatin  40 mg Oral Daily   carvedilol  6.25 mg Oral BID WC   furosemide  60 mg Intravenous BID   isosorbide mononitrate  15 mg Oral Daily   ondansetron **OR** ondansetron (ZOFRAN) IV  Assessment/ Plan:  Taylor [name] is a 82 y.o.  male with past medical conditions including CAD, AICD, gout, bladder cancer, AAA, CVA, hypertension, Barrett's esophagus, ischemic cardiomyopathy, gout, and chronic kidney disease stage III.  Patient presents to the emergency department complaining of shortness of breath.  Patient has been admitted for Other ascites [R18.8] Acute respiratory failure with hypoxia [J96.01] Congestive heart failure, unspecified HF chronicity, unspecified heart failure type [I50.9]   Acute Kidney Injury on chronic kidney disease stage IIIb with baseline creatinine 2.2 and GFR of 30 on  01/13/23.  Acute kidney injury secondary to cardiorenal syndrome Chronic kidney disease is secondary to diabetes and hypertension.  No IV contrast exposure. Creatinine 3.06 on ED admission.  Patient has received diuresis during this admission, 1.8 L of urine recorded in previous 24 hours.  Agree with diuresis for now.  Avoid any other nephrotoxic agents and therapies if possible.  No acute need for dialysis.  Will monitor closely. Lab Results  Component Value Date   CREATININE 3.05 (H) 02/26/2023   CREATININE 3.06 (H) 02/25/2023    CREATININE 3.64 (H) 01/03/2023    Intake/Output Summary (Last 24 hours) at 02/26/2023 1333 Last data filed at 02/26/2023 1317 Gross per 24 hour  Intake 980.06 ml  Output 3280 ml  Net -2299.94 ml   2.  Acute respiratory failure due to acute on chronic combined heart failure.  Echo from 10/30/2022 shows EF less than 20% with a grade 2 diastolic dysfunction, severe MVR, moderate TVR, and mild AVR.  Currently on 6 L nasal cannula, room air at baseline with intermittent use.  Cardiology following.  3. Anemia of chronic kidney disease Lab Results  Component Value Date   HGB 11.9 (L) 02/26/2023    Hemoglobin within optimal range.  4. Diabetes mellitus type II with chronic kidney disease/renal manifestations:noninsulin dependent. Home regimen includes Jardiance. Most recent hemoglobin A1c is 6.5 on 12/24/22.  Cardiology to restart Jardiance tomorrow.  5.  Hypertension with chronic kidney disease.  Home regimen includes carvedilol, isosorbide, losartan, and torsemide.  Also prescribe diuretics of furosemide and metolazone.  Metolazone currently held.  Receiving carvedilol and isosorbide.    LOS: 1 Lasalle Abee 4/3/20241:33 PM

## 2023-02-26 NOTE — Progress Notes (Signed)
PROGRESS NOTE    Taylor [name]   [id number] DOB: 1941/05/09  DOA: 02/25/2023 Date of Service: 02/26/23 PCP: Birdie Sons, MD     Brief Narrative / Hospital Course:  Taylor [name] is a 82 y.o. male with medical history significant of HFrEF, Barrett esophagus, CAD s/p stent placement, s/p AICD, CKDIII, DDD, gout, bladder cancer, HLD, pacemaker placed, psoriasis, AAA, h/o CVA, HTN, ischemic cardiomyopathy, h/o ventricular fibrillation, restrictive lung disease, pleural nodule, h/o pancreatitis, Afib (on eliquis), Type II DM, carotid stenosis presenting with acute on chronic respiratory with hypoxia, ascites, elevated bilirubin.  Wife and patient report progressive increased work of breathing over multiple weeks.  Was evaluated at [facility] clinic cardiology day prior to ED visit.  On torsemide 40 mg daily and  Zaroxolyn 5 mg were added to regimen to help with urine output.  Patient reports being prescribed home oxygen in the past.  However, he does not wear it on a regular basis. 04/02: Presented to the ER afebrile, hemodynamically stable.  O2 sats into the mid 80s RA.  Transition to 5 to 6 L nasal cannula.  White count 5.3, hemoglobin 12, platelets 143, troponin negative x 2.  T. bili 3, creatinine 3.06, GFR 20, BNP 1076. Chest x-ray with enlargement of cardiac silhouette and mild pulmonary edema.  CT of the chest abdomen pelvis with moderate to severe ascites with anasarca and mesenteric stranding. O2 requirement of roughly 5 to 6 L in the setting of CHF and ascites, also AKI on CKD3b. Admitted to hospitalist service. Paracentesis yielded 6L fluids, labs pending  04/03: Cr stable at 3.05, bilirubin about same at 3.2.   Consultants:  Nephrology  Cardiology   Procedures: Paracentesis w/ IR 02/25/2023      ASSESSMENT & PLAN:   Principal Problem:   Acute on chronic respiratory failure with hypoxia Active Problems:   Ascites   Chronic kidney disease (CKD), stage III (moderate)    Systolic CHF with reduced left ventricular function, NYHA class 3   Hyperbilirubinemia   CAD S/P percutaneous coronary angioplasty   Essential (primary) hypertension   Atrial fibrillation  Acute on chronic respiratory failure with hypoxia D/t CHF, ascites  Supplemental O2 to wean as able  Treat underlying causes as below   AKI on CKD3b vs new CKD4 Noted creatinine of 3 with GFR in the 20s Suspect volume overload in setting of CHF with poor renal function as likely etiology Nephrology has been consulted  Close monitor BMP , avoid IV fluids d/t CHF/ascites    Ascites S/p paracentesis --> 6L ascitic fluid, labs pending  SAAG > 1.1 g/dL = Portal hypertension is likely the cause of ascites, with 97% accuracy. Suspect significant hepatic congestion with third spacing d/t CHF Follow paracentesis labs  Continue diuresis Albumin infusion    Systolic CHF with reduced left ventricular function, NYHA class 3 2d ECHO 10/2022 w 2D echocardiogram revealed LVEF less than 20% with global hypokinesis  Required IV Lasix drip during last admission for appropriate diuresis Diuresis --> Lasix 60 mg IV twice daily  I&O, fluid restriction, daily weights  Cardiology consulted Continue Imdur 15 mg daily Continue home GDMT of carvedilol, but reduce dose to 6.25 mg twice daily  hold Jardiance in the setting of CKD as below  Defer repeat echo with recent study   Hyperbilirubinemia T bili 3.2 POA Baseline appears to be around 2.2-2.6  LFTs WNL No GB disease on imaging  Suspect vascular congestion in setting of ascites Checking fractionated bilirubin  Monitor-reassess as clinically appropriate   Paroxysmal Atrial fibrillation Rate controlled atrial fibrillation on EKG rate control w/ Coreg  AC w/ Eliquis     Essential (primary) hypertension BP stable Titrate home regimen with diuresis   CAD S/P percutaneous coronary angioplasty S/p ICD with recent battery change out 02/13/2023  No active  chest pain Troponin negative x 2 EKG with stable atrial fibrillation, heart rate in the 70s Contineu ASA, statin  Noted swelling under the pulse generator pocket incision. Likely hematoma formation post procedurally since on eliquis. Continue monitoring for now        DVT prophylaxis: on Eliquis for Afib  Pertinent IV fluids/nutrition: no continuous IV fluids, fluid restricted low sodium diet  Central lines / invasive devices: none  Code Status: FULL CODE   Current Admission Status: inpatient   TOC needs / Dispo plan: TBD  Barriers to discharge / significant pending items: diuresing, monitoring renal function, expect will be here 2 more days or so              Subjective / Brief ROS:  Patient reports feeling better today but still SOB Denies CP/palpitations Reports LE edema  Pain controlled.  Denies new weakness.  Tolerating diet.  Reports no concerns w/ urination/defecation.   Family Communication: pt reports he is in communication w/ his family and does not need a call to anyone at this time, will let me know if any concerns arise!     Objective Findings:  Vitals:   02/25/23 2116 02/26/23 0008 02/26/23 0435 02/26/23 0846  BP: 110/80 115/85 116/88 116/80  Pulse: 74 67 65 (!) 59  Resp: 16 18 18 18   Temp: 97.6 F (36.4 C) (!) 97.5 F (36.4 C) (!) 97.5 F (36.4 C) 98 F (36.7 C)  TempSrc: Oral     SpO2: 100% 100% 99% 100%  Weight: 82.6 kg  80.9 kg   Height: 5\' 10"  (1.778 m)       Intake/Output Summary (Last 24 hours) at 02/26/2023 1149 Last data filed at 02/26/2023 1011 Gross per 24 hour  Intake 980.06 ml  Output 2850 ml  Net -1869.94 ml   Filed Weights   02/25/23 0917 02/25/23 2116 02/26/23 0435  Weight: 89.4 kg 82.6 kg 80.9 kg    Examination:  Physical Exam Constitutional:      General: He is not in acute distress.    Appearance: He is well-developed. He is not ill-appearing.  Cardiovascular:     Rate and Rhythm: Normal rate and regular  rhythm.     Heart sounds: Normal heart sounds.  Pulmonary:     Effort: Pulmonary effort is normal.     Breath sounds: Examination of the right-lower field reveals decreased breath sounds and rales. Examination of the left-lower field reveals decreased breath sounds and rales. Decreased breath sounds and rales present.  Abdominal:     General: Bowel sounds are normal.     Palpations: Abdomen is soft.  Musculoskeletal:     Cervical back: Normal range of motion and neck supple.     Right lower leg: Edema present.     Left lower leg: Edema present.  Neurological:     General: No focal deficit present.     Mental Status: He is alert and oriented to person, place, and time.  Psychiatric:        Mood and Affect: Mood normal.        Behavior: Behavior normal.          Scheduled  Medications:   apixaban  2.5 mg Oral BID   aspirin  81 mg Oral Daily   atorvastatin  40 mg Oral Daily   carvedilol  6.25 mg Oral BID WC   furosemide  60 mg Intravenous BID   isosorbide mononitrate  15 mg Oral Daily    Continuous Infusions:   PRN Medications:  ondansetron **OR** ondansetron (ZOFRAN) IV  Antimicrobials from admission:  Anti-infectives (From admission, onward)    None           Data Reviewed:  I have personally reviewed the following...  CBC: Recent Labs  Lab 02/25/23 0933 02/26/23 0538  WBC 5.3 4.5  NEUTROABS 3.4  --   HGB 12.2* 11.9*  HCT 39.6 37.5*  MCV 102.1* 97.9  PLT 143* [id number]*   Basic Metabolic Panel: Recent Labs  Lab 02/25/23 0933 02/26/23 0538  NA 134* 135  K 3.6 3.7  CL 98 100  CO2 24 26  GLUCOSE 114* 92  BUN 54* 53*  CREATININE 3.06* 3.05*  CALCIUM 8.7* 9.0   GFR: Estimated Creatinine Clearance: 19.9 mL/min (A) (by C-G formula based on SCr of 3.05 mg/dL (H)). Liver Function Tests: Recent Labs  Lab 02/25/23 0933 02/25/23 1121 02/26/23 0538  AST 37  --  27  ALT 13  --  10  ALKPHOS 174*  --  109  BILITOT 3.2* 3.0* 3.2*  PROT 8.2*  --   6.5  ALBUMIN 3.4*  --  2.8*   Recent Labs  Lab 02/25/23 0933  LIPASE 46   No results for input(s): "AMMONIA" in the last 168 hours. Coagulation Profile: No results for input(s): "INR", "PROTIME" in the last 168 hours. Cardiac Enzymes: No results for input(s): "CKTOTAL", "CKMB", "CKMBINDEX", "TROPONINI" in the last 168 hours. BNP (last 3 results) No results for input(s): "PROBNP" in the last 8760 hours. HbA1C: No results for input(s): "HGBA1C" in the last 72 hours. CBG: No results for input(s): "GLUCAP" in the last 168 hours. Lipid Profile: No results for input(s): "CHOL", "HDL", "LDLCALC", "TRIG", "CHOLHDL", "LDLDIRECT" in the last 72 hours. Thyroid Function Tests: No results for input(s): "TSH", "T4TOTAL", "FREET4", "T3FREE", "THYROIDAB" in the last 72 hours. Anemia Panel: No results for input(s): "VITAMINB12", "FOLATE", "FERRITIN", "TIBC", "IRON", "RETICCTPCT" in the last 72 hours. Most Recent Urinalysis On File:     Component Value Date/Time   COLORURINE YELLOW (A) 02/25/2023 0933   APPEARANCEUR CLEAR (A) 02/25/2023 0933   LABSPEC 1.006 02/25/2023 0933   PHURINE 5.0 02/25/2023 0933   GLUCOSEU 50 (A) 02/25/2023 0933   HGBUR NEGATIVE 02/25/2023 0933   BILIRUBINUR NEGATIVE 02/25/2023 0933   BILIRUBINUR negative 03/31/2018 1452   KETONESUR NEGATIVE 02/25/2023 0933   PROTEINUR NEGATIVE 02/25/2023 0933   UROBILINOGEN 0.2 03/31/2018 1452   NITRITE NEGATIVE 02/25/2023 0933   LEUKOCYTESUR NEGATIVE 02/25/2023 0933   Sepsis Labs: @LABRCNTIP (procalcitonin:4,lacticidven:4) Microbiology: Recent Results (from the past 240 hour(s))  Resp panel by RT-PCR (RSV, Flu A&B, Covid) Anterior Nasal Swab     Status: None   Collection Time: 02/25/23  9:33 AM   Specimen: Anterior Nasal Swab  Result Value Ref Range Status   SARS Coronavirus 2 by RT PCR NEGATIVE NEGATIVE Final    Comment: (NOTE) SARS-CoV-2 target nucleic acids are NOT DETECTED.  The SARS-CoV-2 RNA is generally detectable  in upper respiratory specimens during the acute phase of infection. The lowest concentration of SARS-CoV-2 viral copies this assay can detect is 138 copies/mL. A negative result does not preclude SARS-Cov-2 infection and should not  be used as the sole basis for treatment or other patient management decisions. A negative result may occur with  improper specimen collection/handling, submission of specimen other than nasopharyngeal swab, presence of viral mutation(s) within the areas targeted by this assay, and inadequate number of viral copies(<138 copies/mL). A negative result must be combined with clinical observations, patient history, and epidemiological information. The expected result is Negative.  Fact Sheet for Patients:  EntrepreneurPulse.com.au  Fact Sheet for Healthcare Providers:  IncredibleEmployment.be  This test is no t yet approved or cleared by the [place] FDA and  has been authorized for detection and/or diagnosis of SARS-CoV-2 by FDA under an Emergency Use Authorization (EUA). This EUA will remain  in effect (meaning this test can be used) for the duration of the COVID-19 declaration under Section 564(b)(1) of the Act, 21 U.S.C.section 360bbb-3(b)(1), unless the authorization is terminated  or revoked sooner.       Influenza A by PCR NEGATIVE NEGATIVE Final   Influenza B by PCR NEGATIVE NEGATIVE Final    Comment: (NOTE) The Xpert Xpress SARS-CoV-2/FLU/RSV plus assay is intended as an aid in the diagnosis of influenza from Nasopharyngeal swab specimens and should not be used as a sole basis for treatment. Nasal washings and aspirates are unacceptable for Xpert Xpress SARS-CoV-2/FLU/RSV testing.  Fact Sheet for Patients: EntrepreneurPulse.com.au  Fact Sheet for Healthcare Providers: IncredibleEmployment.be  This test is not yet approved or cleared by the [place] FDA and has  been authorized for detection and/or diagnosis of SARS-CoV-2 by FDA under an Emergency Use Authorization (EUA). This EUA will remain in effect (meaning this test can be used) for the duration of the COVID-19 declaration under Section 564(b)(1) of the Act, 21 U.S.C. section 360bbb-3(b)(1), unless the authorization is terminated or revoked.     Resp Syncytial Virus by PCR NEGATIVE NEGATIVE Final    Comment: (NOTE) Fact Sheet for Patients: EntrepreneurPulse.com.au  Fact Sheet for Healthcare Providers: IncredibleEmployment.be  This test is not yet approved or cleared by the [place] FDA and has been authorized for detection and/or diagnosis of SARS-CoV-2 by FDA under an Emergency Use Authorization (EUA). This EUA will remain in effect (meaning this test can be used) for the duration of the COVID-19 declaration under Section 564(b)(1) of the Act, 21 U.S.C. section 360bbb-3(b)(1), unless the authorization is terminated or revoked.  Performed at [facility], [address] [place], [place] [address]   Body fluid culture w Gram Stain     Status: None (Preliminary result)   Collection Time: 02/25/23  3:07 PM   Specimen: PATH Cytology Peritoneal fluid  Result Value Ref Range Status   Specimen Description   Final    FLUID Performed at [facility], [address], [place], [place] [address]    Special Requests   Final    CYTO PERI Performed at [facility], [address] [place], [place] [address]    Gram Stain   Final    FEW WBC PRESENT, PREDOMINANTLY MONONUCLEAR NO ORGANISMS SEEN Performed at [facility], [place] [address] [place], [place] [address]    Culture PENDING  Incomplete   Report Status PENDING  Incomplete      Radiology Studies last 3 days: US Paracentesis  Result Date: 02/25/2023 INDICATION: CHF exacerbation with ascites EXAM: ULTRASOUND GUIDED diagnostic and therapeutic PARACENTESIS  MEDICATIONS: 5 cc 1% lidocaine COMPLICATIONS: None immediate. PROCEDURE: Informed written consent was obtained from the patient after a discussion of the risks, benefits and  alternatives to treatment. A timeout was performed prior to the initiation of the procedure. Initial ultrasound scanning demonstrates a large amount of ascites within the right lower abdominal quadrant. The right lower abdomen was prepped and draped in the usual sterile fashion. 1% lidocaine was used for local anesthesia. Following this, a 19 gauge, 7-cm, Yueh catheter was introduced. An ultrasound image was saved for documentation purposes. The paracentesis was performed. The catheter was removed and a dressing was applied. The patient tolerated the procedure well without immediate post procedural complication. Patient received post-procedure intravenous albumin; see nursing notes for details. FINDINGS: A total of approximately 6 L of amber fluid was removed. Samples were sent to the laboratory as requested by the clinical team. IMPRESSION: Successful ultrasound-guided paracentesis yielding 6 liters of peritoneal fluid. PLAN: If the patient eventually requires >/=2 paracenteses in a 30 day period, candidacy for formal evaluation by the [facility] will be assessed. Read by: Reatha Armour, PA-C Electronically Signed   By: Corrie Mckusick D.O.   On: 02/25/2023 16:14   CT CHEST ABDOMEN PELVIS WO CONTRAST  Result Date: 02/25/2023 CLINICAL DATA:  Severe shortness of breath. History of COPD. Sepsis EXAM: CT CHEST, ABDOMEN AND PELVIS WITHOUT CONTRAST TECHNIQUE: Multidetector CT imaging of the chest, abdomen and pelvis was performed following the standard protocol without IV contrast. RADIATION DOSE REDUCTION: This exam was performed according to the departmental dose-optimization program which includes automated exposure control, adjustment of the mA and/or kV according to patient size and/or use of  iterative reconstruction technique. COMPARISON:  Abdomen pelvis CT 10/27/2022. Older exams. Chest x-ray earlier 02/25/2023 and older FINDINGS: CT CHEST FINDINGS Cardiovascular: Status post median sternotomy. There is a left chest battery pack with leads along the right side of the heart. The heart itself is enlarged. Trace pericardial fluid. On this non IV contrast exam, the thoracic aorta has a normal course and caliber with scattered vascular calcifications. Significant coronary artery calcifications are identified. Mediastinum/Nodes: Slightly patulous esophagus. Small thyroid gland. No specific abnormal lymph node enlargement seen in the axillary region. There are some calcified nodes in the left hilum. There are some small mediastinal lymph nodes. These are slightly more numerous than usually seen but less than 1 cm in short axis and not pathologic by size criteria. Lungs/Pleura: There is a small right pleural effusion. There are areas of partially calcified pleural plaques bilaterally. No left-sided significant pleural effusion. Some breathing motion. No separate consolidation or pneumothorax. Healed multiple left-sided rib fractures. Musculoskeletal: Scattered degenerative changes of the spine. CT ABDOMEN PELVIS FINDINGS Hepatobiliary: On this non IV contrast exam, the liver is somewhat small. No obvious mass lesion. The gallbladder is nondilated. Pancreas: Moderate atrophy of the pancreas. Spleen: Spleen is nonenlarged. Adrenals/Urinary Tract: Adrenal glands are preserved. There is severe atrophy of the left kidney. No abnormal calcifications are seen within either kidney nor along the course of either ureter. Small hyperdense focus along the upper pole of the right kidney measuring 7 mm. This is unchanged from previous examination and could be a hyperdense lesion. This has been present since at least January 2021 demonstrating long-term stability. No additional follow-up. Stomach/Bowel: On this non oral  contrast exam, large bowel has a normal course and caliber with scattered stool. Left-sided colonic diverticula. Stomach is collapsed. Small bowel is nondilated. Third portion duodenal diverticulum. Vascular/Lymphatic: Aortic endograft in place. Evaluation of the endograft is limited without the advantage of IV contrast but grossly preserved contour. Preserved IVC. No discrete abnormal  lymph node enlargement identified in the abdomen and pelvis. Reproductive: Prominent prostate. Other: Moderate-to-large amount of ascites, new from previous. Diffuse mesenteric stranding and anasarca. There is also bilateral fat containing inguinal hernias. Fluid tracks along the right inguinal canal. Musculoskeletal: Curvature of the spine. Degenerative changes of the spine and pelvis. Multilevel stenosis. Particularly at L2-3 and L1-2. IMPRESSION: Moderate to severe ascites with anasarca mesenteric stranding. No bowel obstruction, free air.  Left-sided colonic diverticula. Aortic endograft. Severe atrophy of the left kidney. Small right pleural effusion. Bilateral partially calcified pleural plaques. Please correlate with any prior environmental exposure such as a specialist Postop chest with enlarged heart and defibrillator. Electronically Signed   By: Jill Side M.D.   On: 02/25/2023 11:22   DG Chest Portable 1 View  Result Date: 02/25/2023 CLINICAL DATA:  Shortness of breath, cough, COPD EXAM: PORTABLE CHEST 1 VIEW COMPARISON:  Portable exam 0930 hours compared to 02/24/2023 FINDINGS: LEFT subclavian ICD with leads projecting over RIGHT atrium and RIGHT ventricle. Enlargement of cardiac silhouette post CABG. Atherosclerotic calcification aorta. Minimal bibasilar atelectasis. Increased interstitial markings similar to previous exam question mild pulmonary edema. No pleural effusion or pneumothorax. IMPRESSION: Enlargement of cardiac silhouette post CABG and ICD with question mild pulmonary edema. Minimal bibasilar  atelectasis. Aortic Atherosclerosis (ICD10-I70.0). Electronically Signed   By: Lavonia Dana M.D.   On: 02/25/2023 09:42   DG Chest 2 View  Result Date: 02/24/2023 CLINICAL DATA:  Shortness of breath. EXAM: CHEST - 2 VIEW COMPARISON:  January 03, 2023 FINDINGS: The heart size and mediastinal contours are stable. The heart size is enlarged. Cardiac pacemaker is unchanged. Increased interstitium is identified in bilateral lungs. There is no focal consolidation or pleural effusion. The visualized skeletal structures are stable. IMPRESSION: Mild congestive heart failure. Electronically Signed   By: Abelardo Diesel M.D.   On: 02/24/2023 09:12             LOS: 1 day      Emeterio Reeve, DO [facility] 02/26/2023, 11:49 AM    Dictation software may have been used to generate the above note. Typos may occur and escape review in typed/dictated notes. Please contact Dr Sheppard Coil directly for clarity if needed.  Staff may message me via secure chat in [place]  but this may not receive an immediate response,  please page me for urgent matters!  If 7PM-7AM, please contact night coverage [email or web address]

## 2023-02-26 NOTE — Discharge Instructions (Signed)

## 2023-02-26 NOTE — Hospital Course (Addendum)
Taylor [name] is a 82 y.o. male with medical history significant of HFrEF, Barrett esophagus, CAD s/p stent placement, s/p AICD, CKDIII, DDD, gout, bladder cancer, HLD, pacemaker placed, psoriasis, AAA, h/o CVA, HTN, ischemic cardiomyopathy, h/o ventricular fibrillation, restrictive lung disease, pleural nodule, h/o pancreatitis, Afib (on eliquis), Type II DM, carotid stenosis presenting with acute on chronic respiratory with hypoxia, ascites, elevated bilirubin.  Wife and patient report progressive increased work of breathing over multiple weeks.  Was evaluated at [facility] clinic cardiology day prior to ED visit.  On torsemide 40 mg daily and  Zaroxolyn 5 mg were added to regimen to help with urine output.  Patient reports being prescribed home oxygen in the past.  However, he does not wear it on a regular basis. 04/02: Presented to the ER afebrile, hemodynamically stable.  O2 sats into the mid 80s RA.  Transition to 5 to 6 L nasal cannula.  White count 5.3, hemoglobin 12, platelets 143, troponin negative x 2.  T. bili 3, creatinine 3.06, GFR 20, BNP 1076. Chest x-ray with enlargement of cardiac silhouette and mild pulmonary edema.  CT of the chest abdomen pelvis with moderate to severe ascites with anasarca and mesenteric stranding. O2 requirement of roughly 5 to 6 L in the setting of CHF and ascites, also AKI on CKD3b. Admitted to hospitalist service. Paracentesis yielded 6L fluids, labs pending  04/03: Cr stable at 3.05, bilirubin about same at 3.2. Continuing diuresis per cardiology/nephrology. On 6L [place] O2 04/04: on room air. Net IO Since Admission: -4,229.94 mL [02/27/23 0857].   Consultants:  Nephrology  Cardiology   Procedures: Paracentesis w/ IR 02/25/2023      ASSESSMENT & PLAN:   Principal Problem:   Acute on chronic respiratory failure with hypoxia Active Problems:   Ascites   Chronic kidney disease (CKD), stage III (moderate)   Systolic CHF with reduced left ventricular function,  NYHA class 3   Hyperbilirubinemia   CAD S/P percutaneous coronary angioplasty   Essential (primary) hypertension   Atrial fibrillation  Acute on chronic respiratory failure with hypoxia D/t CHF, ascites  Supplemental O2 to wean as able  Treat underlying causes as below   AKI on CKD3b vs new CKD4 Noted creatinine of 3 with GFR in the 20s Suspect volume overload in setting of CHF with poor renal function as likely etiology Nephrology has been consulted  Close monitor BMP , avoid IV fluids d/t CHF/ascites  Continue diuresis per nephrology    Ascites S/p paracentesis --> 6L ascitic fluid, labs pending  SAAG > 1.1 g/dL = Portal hypertension is likely the cause of ascites, with 97% accuracy. Suspect significant hepatic congestion with third spacing d/t CHF Follow paracentesis labs  Continue diuresis Albumin infusion    Systolic CHF with reduced left ventricular function, NYHA class 3 2d ECHO 10/2022 w 2D echocardiogram revealed LVEF less than 20% with global hypokinesis  Diuresis --> Lasix 60 mg IV twice daily, good UOP and Cr stable I&O, fluid restriction, daily weights  Cardiology and nephrology following  Continue Imdur 15 mg daily Continue home GDMT of carvedilol, but reduce dose to 6.25 mg twice daily  hold Jardiance in the setting of AKI/CKD as below  Defer repeat echo with recent study   Hyperbilirubinemia T bili 3.2 POA Baseline appears to be around 2.2-2.6  LFTs WNL No GB disease on imaging  Suspect vascular congestion in setting of ascites Checking fractionated bilirubin  Monitor-reassess as clinically appropriate   Paroxysmal Atrial fibrillation Rate controlled atrial  fibrillation on EKG rate control w/ Coreg  AC w/ Eliquis     Essential (primary) hypertension BP stable Titrate home regimen with diuresis   CAD S/P percutaneous coronary angioplasty S/p ICD with recent battery change out 02/13/2023  No active chest pain Troponin negative x 2 EKG with stable  atrial fibrillation, heart rate in the 70s Contineu ASA, statin  Noted swelling under the pulse generator pocket incision. Likely hematoma formation post procedurally since on eliquis. Continue monitoring for now        DVT prophylaxis: on Eliquis for Afib  Pertinent IV fluids/nutrition: no continuous IV fluids, fluid restricted low sodium diet  Central lines / invasive devices: none  Code Status: FULL CODE   Current Admission Status: inpatient   TOC needs / Dispo plan: TBD  Barriers to discharge / significant pending items: diuresing, monitoring renal function, expect will be here 2 more days or so

## 2023-02-26 NOTE — Consult Note (Signed)
   Heart Failure Nurse Navigator Note  HFrEF less than 20%.  Left ventricular internal cavity size is severely dilated.  Mild left ventricular hypertrophy.  Grade 2 diastolic dysfunction.  Normal right ventricular systolic function.  Left atrium is severely dilated.  Right atrium is moderately dilated.  Severe mitral regurgitation.  Moderate to severe tricuspid regurgitation.  Aortic insufficiency is mild.  He presented to the emergency room with complaints of worsening shortness of breath.  He states that his abdomen was also swollen and hard and painful.  BNP was 1076.  Chest x-ray revealed pulmonary edema.  Comorbidities:  Coronary artery disease status post stenting ICD placement Chronic kidney disease stage III Gout Bladder cancer Hyperlipidemia Abdominal aortic aneurysm CVA Hypertension History of ventricular fibrillation Restrictive lung disease  Medications:  Quist 2.5 mg 2 times a day Aspirin 81 mg daily Atorvastatin 40 mg daily Carvedilol 6.25 mg 2 times a day with meals Furosemide 60 mg IV 2 times a day Isosorbide mononitrate 15 mg daily Potassium chloride 40 mEq once  Sodium 135, potassium 3.7, chloride 100, CO2 26, BUN 53, creatinine 3.05, albumin 2.8, estimated GFR 20. \Weight is 80.9 kg Intake 740 mL Output 1825 mL Paracentesis -6 L  Initial meeting with the patient who was lying quietly in bed.  He states at home that he had been weighing himself every day and had noted a 2 to 3 pound weight gain overnight.  He also noted before admission that his abdomen to become very swollen and painful to touch.  He states that he was able to lie down in bed and sleep without any difficulty.  There were no family members present at bed side.  Went over his fluid restriction.  He states that his kidney doctor is recommended that he not take in any more than 40 ounces daily.  He felt that he stuck with that 40 ounces of drinking water.  He would have an occasional 16 ounce  Coke along with his 40 ounces of water but is not something that he does every day.  Does not use salt at the table and tries to eat foods in their natural state staying away from processed foods.  Reinforced when he is weighing himself daily that when he sees a 2 to 3 pound weight gain overnight that he needs to be notifying the outpatient heart failure clinic of his increase.  He voices understanding.  No further questions.  He has follow-up in the outpatient heart failure clinic April 12 and 2 PM.  Has a 5% no-show which is 6 out of 128 appointments missed.  Pricilla Riffle RN

## 2023-02-26 NOTE — Progress Notes (Signed)
[place] NOTE       Patient ID: Taylor [name] MRN: [id number] DOB/AGE: May 18, 1941 82 y.o.  Admit date: 02/25/2023 Referring Physician Dr. Shanda Howells Primary Physician Dr. Lelon Huh Primary Cardiologist Dr. Saralyn Pilar Reason for Consultation AoCHF  HPI: Taylor [name] is an 24yoM with a PMH of chronic HFrEF (EF 20%, moderate MR 02/17/2023), CAD s/p CABG 08/2016, hx VF arrest s/p MI in 1994 s/p dual-chamber ICD (2014) and recent battery change out 02/13/2023, paroxysmal AF (Eliquis), CKD 4, AAA s/p EVAR (2008), COPD, DM2 who presented to [facility] ED 02/25/2023 with shortness of breath and associated cough, abdominal and leg swelling failing outpatient increase in diuretics.  Cardiology is consulted for assistance with his heart failure.  Interval history: -Continues to feel better today, still on 6L oxygen by [place] -Slept okay, had some leg cramping (both legs) that felt better after he stood and walked around.  - no chest pain, had transient fluttering sensation yesterday evening, self terminating without lightheadedness or dizziness.  Denies shortness of breath or worsening peripheral edema.  Renal function fortunately stable after diuresis yesterday  Review of systems complete and found to be negative unless listed above     Past Medical History:  Diagnosis Date   AAA (abdominal aortic aneurysm) ([place]) 06/03/2007   [facility]; Dr. Kellie Simmering   AICD (automatic cardioverter/defibrillator) present    Arrhythmia    atrial fibrillation   Barrett's esophagus    Bladder cancer ([place])    Bradycardia    CAD (coronary artery disease)    CAP (community acquired pneumonia) 11/13/2019   CHF (congestive heart failure) ([place])    Cluster headache    COVID-19 09/27/2021   DDD (degenerative disc disease), lumbar    Diabetes mellitus without complication ([place])    Dyspnea    WITH EXERTION   Edema    LEFT ANKLE   Fracture of skull base ([place]) 1997   due to  fall   GERD (gastroesophageal reflux disease)    Gout    History of bladder cancer 12/1995   Hyperlipidemia    Hypertension    Hypocalcemia 04/19/2020   Possibly secondary to diuretics.   Low=5.9 04/19/2020   Malignant melanoma ([place]) 12/2012   right dorsal forearm excised   Myocardial infarction [facility])    LAST 2014   Osteoarthritis of knee    Other specified complication of vascular prosthetic devices, implants and grafts, initial encounter ([place]) 08/22/2021   Pacemaker 10/10/2006   Pancreatitis 11/22/2019   Pneumonia    2016   Pre-diabetes    Psoriasis    Rib fracture 1997   due to fall   Sleep apnea    CPAP   Stroke [facility])    Venous incompetence     Past Surgical History:  Procedure Laterality Date   ABDOMINAL AORTIC ANEURYSM REPAIR  06/03/2007   [facility]; Dr. Kellie Simmering   ANGIOPLASTY  1994   MI   BLADDER TUMOR EXCISION  12/1995   CAROTID PTA/STENT INTERVENTION Left 07/23/2022   Procedure: CAROTID PTA/STENT INTERVENTION;  Surgeon: Katha Cabal, MD;  Location: [place] [facility];  Service: Cardiovascular;  Laterality: Left;   CATARACT EXTRACTION W/PHACO Left 10/22/2016   Procedure: CATARACT EXTRACTION PHACO AND INTRAOCULAR LENS PLACEMENT (IOC);  Surgeon: Birder Robson, MD;  Location: [facility] ORS;  Service: Ophthalmology;  Laterality: Left;  [place] 47.7 AP% 18.4 CDE 8.78 Fluid pack lot # [id number]   CATARACT EXTRACTION W/PHACO Right 12/10/2016   Procedure:  CATARACT EXTRACTION PHACO AND INTRAOCULAR LENS PLACEMENT (IOC);  Surgeon: Birder Robson, MD;  Location: [facility] ORS;  Service: Ophthalmology;  Laterality: Right;  [place] 00:39 AP% 23.3 CDE 9.13 Fluid pack lot # [id number]   CORONARY ANGIOPLASTY     STENTS X 5   CORONARY ARTERY BYPASS GRAFT  09/22/2006   four   ELBOW BURSA SURGERY     DUE TO GOUT   INSERT / REPLACE / REMOVE PACEMAKER     MELANOMA EXCISION  12/2012   Right forearm   PPM GENERATOR CHANGEOUT N/A 02/13/2023   Procedure: PPM GENERATOR  CHANGEOUT;  Surgeon: Isaias Cowman, MD;  Location: [place] [facility];  Service: Cardiovascular;  Laterality: N/A;    Medications Prior to Admission  Medication Sig Dispense Refill Last Dose   albuterol (VENTOLIN HFA) 108 (90 Base) MCG/ACT inhaler Inhale 2 puffs into the lungs every 6 (six) hours as needed for wheezing or shortness of breath. 8 g 2 unk   allopurinol (ZYLOPRIM) 300 MG tablet Take 0.5 tablets (150 mg total) by mouth daily.   02/24/2023   apixaban (ELIQUIS) 2.5 MG TABS tablet Take 2 tablets (5 mg total) by mouth 2 (two) times daily. 360 tablet 0 02/24/2023   aspirin 81 MG EC tablet Take 81 mg by mouth daily.    02/24/2023   atorvastatin (LIPITOR) 40 MG tablet TAKE 1 TABLET(40 MG) BY MOUTH DAILY 90 tablet 1 02/24/2023   benzonatate (TESSALON) 200 MG capsule Take by mouth.   02/24/2023   carvedilol (COREG) 12.5 MG tablet Take 12.5 mg by mouth 2 (two) times daily with a meal. Daily   02/24/2023   clarithromycin (BIAXIN XL) 500 MG 24 hr tablet Take 2 tablets (1,000 mg total) by mouth daily for 14 days. 14 tablet 0 02/24/2023   empagliflozin (JARDIANCE) 10 MG TABS tablet Take by mouth daily.   02/24/2023   ipratropium-albuterol (DUONEB) 0.5-2.5 (3) MG/3ML SOLN Inhale into the lungs.   02/24/2023   isosorbide mononitrate (IMDUR) 30 MG 24 hr tablet TAKE 1/2 TABLET(15 MG) BY MOUTH DAILY 45 tablet 2 02/24/2023   losartan (COZAAR) 50 MG tablet Take 50 mg by mouth daily.   02/24/2023   Magnesium 200 MG TABS Take 1 tablet (200 mg total) by mouth in the morning and at bedtime. 60 tablet 5 02/24/2023   metolazone (ZAROXOLYN) 5 MG tablet Take by mouth.   02/24/2023   pantoprazole (PROTONIX) 40 MG tablet TAKE 1 TABLET BY MOUTH EVERY DAY 90 tablet 4 02/24/2023   potassium chloride SA (KLOR-CON M) 20 MEQ tablet Take 20 mEq by mouth daily.   02/24/2023   torsemide (DEMADEX) 20 MG tablet Take 40 mg by mouth daily.   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  Independent at baseline.   Social Determinants of Health   Financial Resource Strain: Low Risk  (05/07/2022)   Overall Financial Resource Strain (CARDIA)    Difficulty of Paying Living Expenses: Not hard at all  Food Insecurity: No Food Insecurity (11/04/2022)   Hunger Vital Sign    Worried About Running Out of Food in the Last Year: Never true    Ran Out of Food in the Last Year: Never true  Transportation Needs: No Transportation Needs (11/04/2022)   PRAPARE - Hydrologist (Medical): No    Lack of Transportation (Non-Medical): No  Physical Activity: Insufficiently Active (04/18/2021)   Exercise Vital Sign    Days of Exercise per Week: 1 day    Minutes of Exercise per Session: 120 min  Stress: No Stress Concern Present (05/07/2022)   [place]    Feeling of Stress : Not at all  Social Connections: Moderately Isolated (05/07/2022)   Social Connection and Isolation Panel [NHANES]    Frequency of Communication with Friends and Family: Never    Frequency of Social Gatherings with Friends and Family: Once a week    Attends Religious Services: More than 4 times per year    Active Member of Clubs or Organizations: No    Attends  Archivist Meetings: Never    Marital Status: Married  Human resources officer Violence: Not At Risk (10/31/2022)   Humiliation, Afraid, Rape, and Kick questionnaire    Fear of Current or Ex-Partner: No    Emotionally Abused: No    Physically Abused: No    Sexually Abused: No    Family History  Problem Relation Age of Onset   Cancer Mother        Melanoma skin cancer   Heart attack Father 59   Cancer Father        throat cancer   Arthritis Brother       Intake/Output Summary (Last 24 hours) at 02/26/2023 1240 Last data filed at 02/26/2023 1011 Gross per 24 hour  Intake 980.06 ml  Output 2850 ml  Net -1869.94 ml    Vitals:   02/26/23 0008 02/26/23 0435 02/26/23 0846 02/26/23 1154  BP: 115/85 116/88 116/80 109/76  Pulse: 67 65 (!) 59 (!) 56  Resp: 18 18 18 19   Temp: (!) 97.5 F (36.4 C) (!) 97.5 F (36.4 C) 98 F (36.7 C) 98 F (36.7 C)  TempSrc:      SpO2: 100% 99% 100% 100%  Weight:  80.9 kg    Height:        PHYSICAL EXAM General: Pleasant conversational elderly Caucasian male, well nourished, in no acute distress.  Laying at low incline HEENT:  Normocephalic and atraumatic. Neck:  No JVD.  Lungs: Normal respiratory effort on 6 L by nasal cannula.  Bibasilar crackles.   Chest: L chest with tangerine sized area of swelling under incision for recent ICD battery change. Non tender to palpation, no erythema, warmth, or oozing from incision, steri strips intact.  Heart: Irregularly irregular with controlled rate. Normal S1 and S2 without gallops or murmurs.  Abdomen: Non-distended appearing s/p large-volume paracentesis, bandage present right lower quadrant..  Msk: Normal strength and tone for age. Extremities: Warm to touch.  No clubbing, cyanosis.  1+ left lower extremity edema,  chronic in nature per the patient, much improved/trace lower extremity greater than right lower leg.   Neuro: Alert and oriented X 3. Psych:  Answers questions appropriately.    Labs: Basic Metabolic Panel: Recent Labs    02/25/23 0933 02/26/23 0538  NA 134* 135  K 3.6 3.7  CL 98 100  CO2 24 26  GLUCOSE 114* 92  BUN 54* 53*  CREATININE 3.06* 3.05*  CALCIUM 8.7* 9.0    Liver Function Tests: Recent Labs    02/25/23 0933 02/25/23 1121 02/26/23 0538  AST 37  --  27  ALT 13  --  10  ALKPHOS 174*  --  109  BILITOT 3.2* 3.0* 3.2*  PROT 8.2*  --  6.5  ALBUMIN 3.4*  --  2.8*    Recent Labs    02/25/23 0933  LIPASE 46    CBC: Recent Labs    02/25/23 0933 02/26/23 0538  WBC 5.3 4.5  NEUTROABS 3.4  --   HGB 12.2* 11.9*  HCT 39.6 37.5*  MCV 102.1* 97.9  PLT 143* 123*    Cardiac Enzymes: Recent Labs    02/25/23 0933 02/25/23 1121  TROPONINIHS 17 16    BNP: Recent Labs    02/25/23 0933  BNP 1,076.2*    D-Dimer: No results for input(s): "DDIMER" in the last 72 hours. Hemoglobin A1C: No results for input(s): "HGBA1C" in the last 72 hours. Fasting Lipid Panel: No results for input(s): "CHOL", "HDL", "LDLCALC", "TRIG", "CHOLHDL", "LDLDIRECT" in the last 72 hours. Thyroid Function Tests: No results for input(s): "TSH", "T4TOTAL", "T3FREE", "THYROIDAB" in the last 72 hours.  Invalid input(s): "FREET3" Anemia Panel: No results for input(s): "VITAMINB12", "FOLATE", "FERRITIN", "TIBC", "IRON", "RETICCTPCT" in the last 72 hours.   Radiology: US Paracentesis  Result Date: 02/25/2023 INDICATION: CHF exacerbation with ascites EXAM: ULTRASOUND GUIDED diagnostic and therapeutic PARACENTESIS MEDICATIONS: 5 cc 1% lidocaine COMPLICATIONS: None immediate. PROCEDURE: Informed written consent was obtained from the patient after a discussion of the risks, benefits and alternatives to treatment. A timeout was performed prior to the initiation of the procedure. Initial ultrasound scanning demonstrates a large amount of ascites within the right lower abdominal quadrant. The right lower abdomen was prepped and draped in the usual sterile fashion. 1%  lidocaine was used for local anesthesia. Following this, a 19 gauge, 7-cm, Yueh catheter was introduced. An ultrasound image was saved for documentation purposes. The paracentesis was performed. The catheter was removed and a dressing was applied. The patient tolerated the procedure well without immediate post procedural complication. Patient received post-procedure intravenous albumin; see nursing notes for details. FINDINGS: A total of approximately 6 L of amber fluid was removed. Samples were sent to the laboratory as requested by the clinical team. IMPRESSION: Successful ultrasound-guided paracentesis yielding 6 liters of peritoneal fluid. PLAN: If the patient eventually requires >/=2 paracenteses in a 30 day period, candidacy for formal evaluation by the [facility] will be assessed. Read by: Reatha Armour, PA-C Electronically Signed   By: Corrie Mckusick D.O.   On: 02/25/2023 16:14   CT CHEST ABDOMEN PELVIS WO CONTRAST  Result Date: 02/25/2023 CLINICAL DATA:  Severe shortness of breath. History of COPD. Sepsis EXAM: CT CHEST, ABDOMEN AND PELVIS WITHOUT CONTRAST TECHNIQUE: Multidetector CT imaging of the chest, abdomen and pelvis was performed following the standard protocol without IV contrast. RADIATION DOSE REDUCTION: This exam was performed according to the departmental dose-optimization program which includes automated exposure control, adjustment of the mA and/or  kV according to patient size and/or use of iterative reconstruction technique. COMPARISON:  Abdomen pelvis CT 10/27/2022. Older exams. Chest x-ray earlier 02/25/2023 and older FINDINGS: CT CHEST FINDINGS Cardiovascular: Status post median sternotomy. There is a left chest battery pack with leads along the right side of the heart. The heart itself is enlarged. Trace pericardial fluid. On this non IV contrast exam, the thoracic aorta has a normal course and caliber with scattered vascular  calcifications. Significant coronary artery calcifications are identified. Mediastinum/Nodes: Slightly patulous esophagus. Small thyroid gland. No specific abnormal lymph node enlargement seen in the axillary region. There are some calcified nodes in the left hilum. There are some small mediastinal lymph nodes. These are slightly more numerous than usually seen but less than 1 cm in short axis and not pathologic by size criteria. Lungs/Pleura: There is a small right pleural effusion. There are areas of partially calcified pleural plaques bilaterally. No left-sided significant pleural effusion. Some breathing motion. No separate consolidation or pneumothorax. Healed multiple left-sided rib fractures. Musculoskeletal: Scattered degenerative changes of the spine. CT ABDOMEN PELVIS FINDINGS Hepatobiliary: On this non IV contrast exam, the liver is somewhat small. No obvious mass lesion. The gallbladder is nondilated. Pancreas: Moderate atrophy of the pancreas. Spleen: Spleen is nonenlarged. Adrenals/Urinary Tract: Adrenal glands are preserved. There is severe atrophy of the left kidney. No abnormal calcifications are seen within either kidney nor along the course of either ureter. Small hyperdense focus along the upper pole of the right kidney measuring 7 mm. This is unchanged from previous examination and could be a hyperdense lesion. This has been present since at least January 2021 demonstrating long-term stability. No additional follow-up. Stomach/Bowel: On this non oral contrast exam, large bowel has a normal course and caliber with scattered stool. Left-sided colonic diverticula. Stomach is collapsed. Small bowel is nondilated. Third portion duodenal diverticulum. Vascular/Lymphatic: Aortic endograft in place. Evaluation of the endograft is limited without the advantage of IV contrast but grossly preserved contour. Preserved IVC. No discrete abnormal lymph node enlargement identified in the abdomen and pelvis.  Reproductive: Prominent prostate. Other: Moderate-to-large amount of ascites, new from previous. Diffuse mesenteric stranding and anasarca. There is also bilateral fat containing inguinal hernias. Fluid tracks along the right inguinal canal. Musculoskeletal: Curvature of the spine. Degenerative changes of the spine and pelvis. Multilevel stenosis. Particularly at L2-3 and L1-2. IMPRESSION: Moderate to severe ascites with anasarca mesenteric stranding. No bowel obstruction, free air.  Left-sided colonic diverticula. Aortic endograft. Severe atrophy of the left kidney. Small right pleural effusion. Bilateral partially calcified pleural plaques. Please correlate with any prior environmental exposure such as a specialist Postop chest with enlarged heart and defibrillator. Electronically Signed   By: Jill Side M.D.   On: 02/25/2023 11:22   DG Chest Portable 1 View  Result Date: 02/25/2023 CLINICAL DATA:  Shortness of breath, cough, COPD EXAM: PORTABLE CHEST 1 VIEW COMPARISON:  Portable exam 0930 hours compared to 02/24/2023 FINDINGS: LEFT subclavian ICD with leads projecting over RIGHT atrium and RIGHT ventricle. Enlargement of cardiac silhouette post CABG. Atherosclerotic calcification aorta. Minimal bibasilar atelectasis. Increased interstitial markings similar to previous exam question mild pulmonary edema. No pleural effusion or pneumothorax. IMPRESSION: Enlargement of cardiac silhouette post CABG and ICD with question mild pulmonary edema. Minimal bibasilar atelectasis. Aortic Atherosclerosis (ICD10-I70.0). Electronically Signed   By: Lavonia Dana M.D.   On: 02/25/2023 09:42   DG Chest 2 View  Result Date: 02/24/2023 CLINICAL DATA:  Shortness of breath. EXAM: CHEST - 2 VIEW  COMPARISON:  January 03, 2023 FINDINGS: The heart size and mediastinal contours are stable. The heart size is enlarged. Cardiac pacemaker is unchanged. Increased interstitium is identified in bilateral lungs. There is no focal  consolidation or pleural effusion. The visualized skeletal structures are stable. IMPRESSION: Mild congestive heart failure. Electronically Signed   By: Abelardo Diesel M.D.   On: 02/24/2023 09:12   EP PPM/ICD IMPLANT  Result Date: 02/13/2023 Successful dual-chamber ICD generator change    ECHO 02/17/2023   1. TEE TO ASSESS MV REGURGITATION.    2. NORMAL APPEARING MV WITH FUNCTIONAL MODERATE MR (PISA radius = 0.6 cm,    VC = 0.45 cm, EROA = 0.14 cm2, Regurg Volume = 23 mL). THERE IS BLUNTING    OF THE PULMONARY VEIN S WAVE WITHOUT REVERSAL.    3. SEVERE LV AND RV SYSTOLIC DYSFUNCTION EF = 20%.    4. MILD AR, TRIVIAL PR, MODERATE TR.    5. NO LAA THROMBUS PRESENT.   TELEMETRY reviewed by me (LT) 02/26/2023 : AF rate 60s-70s occasional V pacing  EKG reviewed by me: AF nonspecific IVCD rate 77  Data reviewed by me (LT) 02/26/2023: Hospitalist progress note, nursing notes last 24h vitals tele labs imaging I/O   Principal Problem:   Acute on chronic respiratory failure with hypoxia Active Problems:   CAD S/P percutaneous coronary angioplasty   Systolic CHF with reduced left ventricular function, NYHA class 3   Chronic kidney disease (CKD), stage III (moderate)   Essential (primary) hypertension   Atrial fibrillation   Ascites   Hyperbilirubinemia    ASSESSMENT AND PLAN:  Taylor [name] is an 54yoM with a PMH of chronic HFrEF (EF 20%, moderate MR 02/17/2023), CAD s/p CABG 08/2016, hx VF arrest s/p MI in 1994 s/p dual-chamber ICD (2014) and recent battery change out 02/13/2023, paroxysmal AF (Eliquis), CKD 4, AAA s/p EVAR (2008), COPD, DM2 who presented to [facility] ED 02/25/2023 with shortness of breath and associated cough, abdominal and leg swelling failing outpatient increase in diuretics.  Cardiology is consulted for assistance with his heart failure.  # Anasarca # Acute on chronic HFrEF # Moderate mitral regurgitation Presents with 2 weeks of progressive shortness of breath, peripheral edema,  abdominal distention, and decreased urine output, refractory to addition of metolazone on top of his home diuretic regimen of torsemide 60 mg in the morning and 40 mg at night.  Recent dietary indiscretions of country ham, chicken pot pie (Easter dinner).  CT chest abdomen pelvis with moderate to severe ascites with anasarca and mesenteric stranding with severe left kidney atrophy.  Clinical improvement following large-volume paracentesis of 6 L and initial IV diuresis.  Query underlying liver disease versus dietary indiscretions in the setting of chronic HFrEF and CKD 4 contributing to this exacerbation. -Continue IV diuresis with Lasix 60 mg IV twice daily -S/p albumin infusion -Continue home GDMT of carvedilol, but reduce dose to 6.25 mg twice daily -Continue Imdur 15 mg daily,  -Restart Jardiance 10 mg daily tomorrow if GFR >20 -Salt and fluid restriction, strict I's/O -Defer repeat echo with recent study  # CKD4 Monitor closely with diuresis, renal function on admission with BUN/creatinine 54/3.06 and GFR of 20.  # CAD s/p CABG without chest pain # S/p ICD with recent battery change out 02/13/2023 # ?hematoma Chest pain-free on admission.  Continue aspirin and atorvastatin. Noted swelling under the pulse generator pocket incision. Likely hematoma formation post procedurally since on eliquis. Continue monitoring for now  # Paroxysmal  atrial fibrillation Rate controlled on telemetry, continue Eliquis 2.5 mg twice daily for stroke prevention (age = 55, creatinine >1.5), Coreg for rate control.  This patient's plan of care was discussed and created with Dr. Saralyn Pilar and he is in agreement.  Signed: Tristan Schroeder , PA-C 02/26/2023, 12:40 PM [facility]

## 2023-02-27 DIAGNOSIS — J9621 Acute and chronic respiratory failure with hypoxia: Secondary | ICD-10-CM | POA: Diagnosis not present

## 2023-02-27 LAB — COMPREHENSIVE METABOLIC PANEL
ALT: 10 U/L (ref 0–44)
AST: 24 U/L (ref 15–41)
Albumin: 2.8 g/dL — ABNORMAL LOW (ref 3.5–5.0)
Alkaline Phosphatase: 117 U/L (ref 38–126)
Anion gap: 12 (ref 5–15)
BUN: 58 mg/dL — ABNORMAL HIGH (ref 8–23)
CO2: 28 mmol/L (ref 22–32)
Calcium: 9.1 mg/dL (ref 8.9–10.3)
Chloride: 95 mmol/L — ABNORMAL LOW (ref 98–111)
Creatinine, Ser: 3.02 mg/dL — ABNORMAL HIGH (ref 0.61–1.24)
GFR, Estimated: 20 mL/min — ABNORMAL LOW (ref >60)
Glucose, Bld: 116 mg/dL — ABNORMAL HIGH (ref 70–99)
Potassium: 3.6 mmol/L (ref 3.5–5.1)
Sodium: 135 mmol/L (ref 135–145)
Total Bilirubin: 2.8 mg/dL — ABNORMAL HIGH (ref 0.3–1.2)
Total Protein: 6.4 g/dL — ABNORMAL LOW (ref 6.5–8.1)

## 2023-02-27 LAB — TRIGLYCERIDES, BODY FLUIDS: Triglycerides, Fluid: 37 mg/dL

## 2023-02-27 MED ORDER — TORSEMIDE 20 MG PO TABS: 40.0000 mg | ORAL_TABLET | Freq: Every day | ORAL | Status: DC

## 2023-02-27 MED ORDER — TORSEMIDE 20 MG PO TABS: ORAL_TABLET | ORAL | 0 refills | Status: DC

## 2023-02-27 MED ORDER — CARVEDILOL 6.25 MG PO TABS: 6.2500 mg | ORAL_TABLET | Freq: Two times a day (BID) | ORAL | 0 refills | Status: DC

## 2023-02-27 MED ORDER — TORSEMIDE 20 MG PO TABS: 60.0000 mg | ORAL_TABLET | Freq: Every morning | ORAL | Status: DC

## 2023-02-27 MED ORDER — EMPAGLIFLOZIN 10 MG PO TABS
10.0000 mg | ORAL_TABLET | Freq: Every day | ORAL | Status: DC
  Administered 2023-02-27: 10 mg via ORAL
  Filled 2023-02-27: qty 1

## 2023-02-27 NOTE — Progress Notes (Signed)
[employer] NOTE       Patient ID: Taylor [name] MRN: [id number] DOB/AGE: 06/21/1941 82 y.o.  Admit date: 02/25/2023 Referring Physician Dr. Shanda Howells Primary Physician Dr. Lelon Huh Primary Cardiologist Dr. Saralyn Pilar Reason for Consultation AoCHF  HPI: Taylor [name]. Taylor [name] is an 82yoM with a PMH of chronic HFrEF (EF 20%, moderate MR 02/17/2023), CAD s/p CABG 81/2017, hx VF arrest s/p MI in 1994 s/p dual-chamber ICD (2014) and recent battery change out 02/13/2023, paroxysmal AF (Eliquis), CKD 4, AAA s/p EVAR (2008), COPD, DM2 who presented to [facility] ED 02/25/2023 with shortness of breath and associated cough, abdominal and leg swelling failing outpatient increase in diuretics.  Cardiology is consulted for assistance with his heart failure.  Interval history: -Laying flat in bed, weaned from 6L to room air yesterday -Peripheral edema much improved -No chest pain or shortness of breath, very eager to go home -Net -4.2 L UOP  Review of systems complete and found to be negative unless listed above     Past Medical History:  Diagnosis Date   AAA (abdominal aortic aneurysm) ([place]) 06/03/2007   [facility]; Dr. Kellie Simmering   AICD (automatic cardioverter/defibrillator) present    Arrhythmia    atrial fibrillation   Barrett's esophagus    Bladder cancer ([place])    Bradycardia    CAD (coronary artery disease)    CAP (community acquired pneumonia) 11/13/2019   CHF (congestive heart failure) ([place])    Cluster headache    COVID-19 09/27/2021   DDD (degenerative disc disease), lumbar    Diabetes mellitus without complication ([place])    Dyspnea    WITH EXERTION   Edema    LEFT ANKLE   Fracture of skull base ([place]) 1997   due to fall   GERD (gastroesophageal reflux disease)    Gout    History of bladder cancer 12/1995   Hyperlipidemia    Hypertension    Hypocalcemia 04/19/2020   Possibly secondary to diuretics.   Low=5.9 04/19/2020   Malignant melanoma  ([place]) 12/2012   right dorsal forearm excised   Myocardial infarction [facility])    LAST 2014   Osteoarthritis of knee    Other specified complication of vascular prosthetic devices, implants and grafts, initial encounter ([place]) 08/22/2021   Pacemaker 10/10/2006   Pancreatitis 11/22/2019   Pneumonia    2016   Pre-diabetes    Psoriasis    Rib fracture 1997   due to fall   Sleep apnea    CPAP   Stroke [facility])    Venous incompetence     Past Surgical History:  Procedure Laterality Date   ABDOMINAL AORTIC ANEURYSM REPAIR  06/03/2007   [facility]; Dr. Kellie Simmering   ANGIOPLASTY  1994   MI   BLADDER TUMOR EXCISION  12/1995   CAROTID PTA/STENT INTERVENTION Left 07/23/2022   Procedure: CAROTID PTA/STENT INTERVENTION;  Surgeon: Katha Cabal, MD;  Location: [facility];  Service: Cardiovascular;  Laterality: Left;   CATARACT EXTRACTION W/PHACO Left 10/22/2016   Procedure: CATARACT EXTRACTION PHACO AND INTRAOCULAR LENS PLACEMENT (IOC);  Surgeon: Birder Robson, MD;  Location: [facility] ORS;  Service: Ophthalmology;  Laterality: Left;  [place] 47.7 AP% 18.4 CDE 8.78 Fluid pack lot # [id number]   CATARACT EXTRACTION W/PHACO Right 12/10/2016   Procedure: CATARACT EXTRACTION PHACO AND INTRAOCULAR LENS PLACEMENT (IOC);  Surgeon: Birder Robson, MD;  Location: [facility] ORS;  Service: Ophthalmology;  Laterality: Right;  [place] 00:39 AP% 23.3 CDE 9.13 Fluid  pack lot # [id number]   CORONARY ANGIOPLASTY     STENTS X 5   CORONARY ARTERY BYPASS GRAFT  09/22/2006   four   ELBOW BURSA SURGERY     DUE TO GOUT   INSERT / REPLACE / REMOVE PACEMAKER     MELANOMA EXCISION  12/2012   Right forearm   PPM GENERATOR CHANGEOUT N/A 02/13/2023   Procedure: PPM GENERATOR CHANGEOUT;  Surgeon: Isaias Cowman, MD;  Location: [place];  Service: Cardiovascular;  Laterality: N/A;    Medications Prior to Admission  Medication Sig Dispense Refill Last Dose   albuterol (VENTOLIN HFA) 108 (90  Base) MCG/ACT inhaler Inhale 2 puffs into the lungs every 6 (six) hours as needed for wheezing or shortness of breath. 8 g 2 unk   allopurinol (ZYLOPRIM) 300 MG tablet Take 0.5 tablets (150 mg total) by mouth daily.   02/24/2023   apixaban (ELIQUIS) 2.5 MG TABS tablet Take 2 tablets (5 mg total) by mouth 2 (two) times daily. 360 tablet 0 02/24/2023   aspirin 81 MG EC tablet Take 81 mg by mouth daily.    02/24/2023   atorvastatin (LIPITOR) 40 MG tablet TAKE 1 TABLET(40 MG) BY MOUTH DAILY 90 tablet 1 02/24/2023   benzonatate (TESSALON) 200 MG capsule Take by mouth.   02/24/2023   carvedilol (COREG) 12.5 MG tablet Take 12.5 mg by mouth 2 (two) times daily with a meal. Daily   02/24/2023   clarithromycin (BIAXIN XL) 500 MG 24 hr tablet Take 2 tablets (1,000 mg total) by mouth daily for 14 days. 14 tablet 0 02/24/2023   empagliflozin (JARDIANCE) 10 MG TABS tablet Take by mouth daily.   02/24/2023   ipratropium-albuterol (DUONEB) 0.5-2.5 (3) MG/3ML SOLN Inhale into the lungs.   02/24/2023   isosorbide mononitrate (IMDUR) 30 MG 24 hr tablet TAKE 1/2 TABLET(15 MG) BY MOUTH DAILY 45 tablet 2 02/24/2023   losartan (COZAAR) 50 MG tablet Take 50 mg by mouth daily.   02/24/2023   Magnesium 200 MG TABS Take 1 tablet (200 mg total) by mouth in the morning and at bedtime. 60 tablet 5 02/24/2023   metolazone (ZAROXOLYN) 5 MG tablet Take by mouth.   02/24/2023   pantoprazole (PROTONIX) 40 MG tablet TAKE 1 TABLET BY MOUTH EVERY DAY 90 tablet 4 02/24/2023   potassium chloride SA (KLOR-CON M) 20 MEQ tablet Take 20 mEq by mouth daily.   02/24/2023   torsemide (DEMADEX) 20 MG tablet Take 40 mg by mouth daily.   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  Independent at baseline.  Social Determinants of Health   Financial Resource Strain: Low Risk  (05/07/2022)   Overall Financial Resource Strain (CARDIA)    Difficulty of Paying Living Expenses: Not hard at all  Food Insecurity: No Food Insecurity (11/04/2022)   Hunger Vital Sign    Worried About Running Out of Food in the Last Year: Never true    Ran Out of Food in the Last Year: Never true  Transportation Needs: No Transportation Needs (11/04/2022)   PRAPARE - Hydrologist (Medical): No    Lack of Transportation (Non-Medical): No  Physical Activity: Insufficiently Active (04/18/2021)   Exercise Vital Sign    Days of Exercise per Week: 1 day    Minutes of Exercise per Session: 120 min  Stress: No Stress Concern Present (05/07/2022)   [place]    Feeling of Stress : Not at all  Social Connections: Moderately Isolated (05/07/2022)   Social Connection and Isolation Panel [NHANES]    Frequency of Communication with Friends and Family: Never    Frequency of Social Gatherings with Friends and Family: Once a week    Attends Religious Services: More than 4 times per year    Active Member of Clubs or Organizations: No    Attends Archivist Meetings: Never    Marital Status: Married  Human resources officer Violence: Not At Risk (10/31/2022)   Humiliation, Afraid, Rape, and Kick questionnaire    Fear of Current or Ex-Partner: No    Emotionally Abused: No     Physically Abused: No    Sexually Abused: No    Family History  Problem Relation Age of Onset   Cancer Mother        Melanoma skin cancer   Heart attack Father 54   Cancer Father        throat cancer   Arthritis Brother       Intake/Output Summary (Last 24 hours) at 02/27/2023 0854 Last data filed at 02/27/2023 [id number] Gross per 24 hour  Intake 1320 ml  Output 4105 ml  Net -2785 ml     Vitals:   02/26/23 1739 02/26/23 1940 02/26/23 2340 02/27/23 0312  BP: 101/76 107/75 111/81 101/76  Pulse: 61 62 (!) 59 66  Resp: 20 18 18 18   Temp: 97.9 F (36.6 C) 97.7 F (36.5 C) 97.7 F (36.5 C) 97.6 F (36.4 C)  TempSrc:      SpO2: 95% 99% 96% 90%  Weight:    78 kg  Height:        PHYSICAL EXAM General: Pleasant conversational elderly Caucasian male, well nourished, in no acute distress.  Laying flat in bed on room air HEENT:  Normocephalic and atraumatic. Neck:  No JVD.  Lungs: Normal respiratory effort on room air, clear to auscultation bilaterally.   Chest: L chest with tangerine sized area of swelling under incision for recent ICD battery change which has decreased since yesterday.. Non tender to palpation, no erythema, warmth, or oozing from incision, steri strips intact.  Heart: Irregularly irregular with controlled rate. Normal S1 and S2 without gallops or murmurs.  Abdomen: Non-distended appearing, nontender to palpation. Msk: Normal strength and tone for age. Extremities: Warm to touch.  No clubbing, cyanosis.  1+ left lower extremity edema, chronic in nature per the patient, much improved/trace lower extremity greater than right lower leg.   Neuro: Alert and oriented X 3. Psych:  Answers questions appropriately.   Labs: Basic Metabolic Panel:  Recent Labs    02/26/23 0538 02/27/23 0510  NA 135 135  K 3.7 3.6  CL 100 95*  CO2 26 28  GLUCOSE 92 116*  BUN 53* 58*  CREATININE 3.05* 3.02*  CALCIUM 9.0 9.1    Liver Function Tests: Recent Labs    02/26/23 0538  02/27/23 0510  AST 27 24  ALT 10 10  ALKPHOS 109 117  BILITOT 3.2* 2.8*  PROT 6.5 6.4*  ALBUMIN 2.8* 2.8*    Recent Labs    02/25/23 0933  LIPASE 46    CBC: Recent Labs    02/25/23 0933 02/26/23 0538  WBC 5.3 4.5  NEUTROABS 3.4  --   HGB 12.2* 11.9*  HCT 39.6 37.5*  MCV 102.1* 97.9  PLT 143* 123*    Cardiac Enzymes: Recent Labs    02/25/23 0933 02/25/23 1121  TROPONINIHS 17 16    BNP: Recent Labs    02/25/23 0933  BNP 1,076.2*    D-Dimer: No results for input(s): "DDIMER" in the last 72 hours. Hemoglobin A1C: No results for input(s): "HGBA1C" in the last 72 hours. Fasting Lipid Panel: No results for input(s): "CHOL", "HDL", "LDLCALC", "TRIG", "CHOLHDL", "LDLDIRECT" in the last 72 hours. Thyroid Function Tests: No results for input(s): "TSH", "T4TOTAL", "T3FREE", "THYROIDAB" in the last 72 hours.  Invalid input(s): "FREET3" Anemia Panel: No results for input(s): "VITAMINB12", "FOLATE", "FERRITIN", "TIBC", "IRON", "RETICCTPCT" in the last 72 hours.   Radiology: US Paracentesis  Result Date: 02/25/2023 INDICATION: CHF exacerbation with ascites EXAM: ULTRASOUND GUIDED diagnostic and therapeutic PARACENTESIS MEDICATIONS: 5 cc 1% lidocaine COMPLICATIONS: None immediate. PROCEDURE: Informed written consent was obtained from the patient after a discussion of the risks, benefits and alternatives to treatment. A timeout was performed prior to the initiation of the procedure. Initial ultrasound scanning demonstrates a large amount of ascites within the right lower abdominal quadrant. The right lower abdomen was prepped and draped in the usual sterile fashion. 1% lidocaine was used for local anesthesia. Following this, a 19 gauge, 7-cm, Yueh catheter was introduced. An ultrasound image was saved for documentation purposes. The paracentesis was performed. The catheter was removed and a dressing was applied. The patient tolerated the procedure well without immediate post  procedural complication. Patient received post-procedure intravenous albumin; see nursing notes for details. FINDINGS: A total of approximately 6 L of amber fluid was removed. Samples were sent to the laboratory as requested by the clinical team. IMPRESSION: Successful ultrasound-guided paracentesis yielding 6 liters of peritoneal fluid. PLAN: If the patient eventually requires >/=2 paracenteses in a 30 day period, candidacy for formal evaluation by the [facility] will be assessed. Read by: Reatha Armour, PA-C Electronically Signed   By: Corrie Mckusick D.O.   On: 02/25/2023 16:14   CT CHEST ABDOMEN PELVIS WO CONTRAST  Result Date: 02/25/2023 CLINICAL DATA:  Severe shortness of breath. History of COPD. Sepsis EXAM: CT CHEST, ABDOMEN AND PELVIS WITHOUT CONTRAST TECHNIQUE: Multidetector CT imaging of the chest, abdomen and pelvis was performed following the standard protocol without IV contrast. RADIATION DOSE REDUCTION: This exam was performed according to the departmental dose-optimization program which includes automated exposure control, adjustment of the mA and/or kV according to patient size and/or use of iterative reconstruction technique. COMPARISON:  Abdomen pelvis CT 10/27/2022. Older exams. Chest x-ray earlier 02/25/2023 and older FINDINGS: CT CHEST FINDINGS Cardiovascular: Status post median sternotomy. There is a left chest battery pack with leads along the right side of the heart. The heart  itself is enlarged. Trace pericardial fluid. On this non IV contrast exam, the thoracic aorta has a normal course and caliber with scattered vascular calcifications. Significant coronary artery calcifications are identified. Mediastinum/Nodes: Slightly patulous esophagus. Small thyroid gland. No specific abnormal lymph node enlargement seen in the axillary region. There are some calcified nodes in the left hilum. There are some small mediastinal lymph nodes. These  are slightly more numerous than usually seen but less than 1 cm in short axis and not pathologic by size criteria. Lungs/Pleura: There is a small right pleural effusion. There are areas of partially calcified pleural plaques bilaterally. No left-sided significant pleural effusion. Some breathing motion. No separate consolidation or pneumothorax. Healed multiple left-sided rib fractures. Musculoskeletal: Scattered degenerative changes of the spine. CT ABDOMEN PELVIS FINDINGS Hepatobiliary: On this non IV contrast exam, the liver is somewhat small. No obvious mass lesion. The gallbladder is nondilated. Pancreas: Moderate atrophy of the pancreas. Spleen: Spleen is nonenlarged. Adrenals/Urinary Tract: Adrenal glands are preserved. There is severe atrophy of the left kidney. No abnormal calcifications are seen within either kidney nor along the course of either ureter. Small hyperdense focus along the upper pole of the right kidney measuring 7 mm. This is unchanged from previous examination and could be a hyperdense lesion. This has been present since at least January 2021 demonstrating long-term stability. No additional follow-up. Stomach/Bowel: On this non oral contrast exam, large bowel has a normal course and caliber with scattered stool. Left-sided colonic diverticula. Stomach is collapsed. Small bowel is nondilated. Third portion duodenal diverticulum. Vascular/Lymphatic: Aortic endograft in place. Evaluation of the endograft is limited without the advantage of IV contrast but grossly preserved contour. Preserved IVC. No discrete abnormal lymph node enlargement identified in the abdomen and pelvis. Reproductive: Prominent prostate. Other: Moderate-to-large amount of ascites, new from previous. Diffuse mesenteric stranding and anasarca. There is also bilateral fat containing inguinal hernias. Fluid tracks along the right inguinal canal. Musculoskeletal: Curvature of the spine. Degenerative changes of the spine and  pelvis. Multilevel stenosis. Particularly at L2-3 and L1-2. IMPRESSION: Moderate to severe ascites with anasarca mesenteric stranding. No bowel obstruction, free air.  Left-sided colonic diverticula. Aortic endograft. Severe atrophy of the left kidney. Small right pleural effusion. Bilateral partially calcified pleural plaques. Please correlate with any prior environmental exposure such as a specialist Postop chest with enlarged heart and defibrillator. Electronically Signed   By: Jill Side M.D.   On: 02/25/2023 11:22   DG Chest Portable 1 View  Result Date: 02/25/2023 CLINICAL DATA:  Shortness of breath, cough, COPD EXAM: PORTABLE CHEST 1 VIEW COMPARISON:  Portable exam 0930 hours compared to 02/24/2023 FINDINGS: LEFT subclavian ICD with leads projecting over RIGHT atrium and RIGHT ventricle. Enlargement of cardiac silhouette post CABG. Atherosclerotic calcification aorta. Minimal bibasilar atelectasis. Increased interstitial markings similar to previous exam question mild pulmonary edema. No pleural effusion or pneumothorax. IMPRESSION: Enlargement of cardiac silhouette post CABG and ICD with question mild pulmonary edema. Minimal bibasilar atelectasis. Aortic Atherosclerosis (ICD10-I70.0). Electronically Signed   By: Lavonia Dana M.D.   On: 02/25/2023 09:42   DG Chest 2 View  Result Date: 02/24/2023 CLINICAL DATA:  Shortness of breath. EXAM: CHEST - 2 VIEW COMPARISON:  January 03, 2023 FINDINGS: The heart size and mediastinal contours are stable. The heart size is enlarged. Cardiac pacemaker is unchanged. Increased interstitium is identified in bilateral lungs. There is no focal consolidation or pleural effusion. The visualized skeletal structures are stable. IMPRESSION: Mild congestive heart failure. Electronically Signed  By: Abelardo Diesel M.D.   On: 02/24/2023 09:12   EP PPM/ICD IMPLANT  Result Date: 02/13/2023 Successful dual-chamber ICD generator change    ECHO 02/17/2023   1. TEE TO ASSESS  MV REGURGITATION.    2. NORMAL APPEARING MV WITH FUNCTIONAL MODERATE MR (PISA radius = 0.6 cm,    VC = 0.45 cm, EROA = 0.14 cm2, Regurg Volume = 23 mL). THERE IS BLUNTING    OF THE PULMONARY VEIN S WAVE WITHOUT REVERSAL.    3. SEVERE LV AND RV SYSTOLIC DYSFUNCTION EF = 20%.    4. MILD AR, TRIVIAL PR, MODERATE TR.    5. NO LAA THROMBUS PRESENT.   TELEMETRY reviewed by me (LT) 02/27/2023 : AF rate 60s-70s occasional V pacing  EKG reviewed by me: AF nonspecific IVCD rate 77  Data reviewed by me (LT) 02/27/2023: Hospitalist progress note, nursing notes last 24h vitals tele labs imaging I/O   Principal Problem:   Acute on chronic respiratory failure with hypoxia Active Problems:   CAD S/P percutaneous coronary angioplasty   Systolic CHF with reduced left ventricular function, NYHA class 3   Chronic kidney disease (CKD), stage III (moderate)   Essential (primary) hypertension   Atrial fibrillation   Ascites   Hyperbilirubinemia    ASSESSMENT AND PLAN:  Myquan Vito. Schloss is an 51yoM with a PMH of chronic HFrEF (EF 20%, moderate MR 02/17/2023), CAD s/p CABG 81/2017, hx VF arrest s/p MI in 1994 s/p dual-chamber ICD (2014) and recent battery change out 02/13/2023, paroxysmal AF (Eliquis), CKD 4, AAA s/p EVAR (2008), COPD, DM2 who presented to [facility] ED 02/25/2023 with shortness of breath and associated cough, abdominal and leg swelling failing outpatient increase in diuretics.  Cardiology is consulted for assistance with his heart failure.  # Anasarca # Acute on chronic HFrEF # Moderate mitral regurgitation Presents with 2 weeks of progressive shortness of breath, peripheral edema, abdominal distention, and decreased urine output, refractory to addition of metolazone on top of his home diuretic regimen of torsemide 60 mg in the morning and 40 mg at night.  Recent dietary indiscretions of country ham, chicken pot pie (Easter dinner).  CT chest abdomen pelvis with moderate to severe ascites with anasarca and  mesenteric stranding with severe left kidney atrophy.  Clinical improvement following large-volume paracentesis of 6 L and initial IV diuresis.  Query underlying liver disease versus dietary indiscretions in the setting of chronic HFrEF and CKD 4 contributing to this exacerbation. -S/p IV diuresis with Lasix 60 mg twice daily, resume home torsemide 60 mg in the morning and 40 mg at night.  Stop metolazone. -S/p albumin infusion -Continue home GDMT of carvedilol 6.25 mg twice daily -Continue Imdur 15 mg daily,  -Restart Jardiance 10 mg today -Hold losartan for now with borderline BP and until recheck of BMP on an outpatient basis -Salt and fluid restriction, strict I's/O -Defer repeat echo with recent study  # CKD4 Monitor closely with diuresis, renal function on admission with BUN/creatinine 54/3.06 and GFR of 20.  # CAD s/p CABG without chest pain # S/p ICD with recent battery change out 02/13/2023 # ?hematoma Chest pain-free on admission.  Continue aspirin and atorvastatin. Noted swelling under the pulse generator pocket incision decreased in size during this admission. Likely hematoma formation post procedurally since on eliquis. Continue monitoring for now  # Paroxysmal atrial fibrillation Rate controlled on telemetry, continue Eliquis 2.5 mg twice daily for stroke prevention (age = 15, creatinine >1.5), Coreg for rate control.  [place] for discharge today from a cardiac perspective. Will arrange for follow up in clinic with Dr. Saralyn Pilar or Leroy Libman, PA-C in clinic in 1-2 weeks.    This patient's plan of care was discussed and created with Dr. Clayborn Bigness and he is in agreement.  Signed: Tristan Schroeder , PA-C 02/27/2023, 8:54 AM [facility]

## 2023-02-27 NOTE — Progress Notes (Signed)
[facility]  ROUNDING NOTE   Subjective:   Taylor [name] is a 82 year old male with past medical conditions including CAD, AICD, gout, bladder cancer, AAA, CVA, hypertension, Barrett's esophagus, ischemic cardiomyopathy, gout, and chronic kidney disease stage III.  Patient presents to the emergency department complaining of shortness of breath.  Patient has been admitted for Other ascites [R18.8] Acute respiratory failure with hypoxia [J96.01] Congestive heart failure, unspecified HF chronicity, unspecified heart failure type [I50.9]  Patient is known to our practice and is followed outpatient by Dr.Korrapati.  He was last seen in office on 01/28/2023 for routine follow-up.    Patient seen sitting at bedside Alert and oriented States he feels well today.  Weaned to room air Lower extremity edema improved.    Objective:  Vital signs in last 24 hours:  Temp:  [97.6 F (36.4 C)-98 F (36.7 C)] 97.6 F (36.4 C) (04/04 1124) Pulse Rate:  [59-71] 67 (04/04 1124) Resp:  [18-20] 20 (04/04 1124) BP: (101-155)/(74-138) 128/74 (04/04 1124) SpO2:  [90 %-100 %] 100 % (04/04 1124) Weight:  [78 kg] 78 kg (04/04 0312)  Weight change: -11.4 kg Filed Weights   02/25/23 2116 02/26/23 0435 02/27/23 0312  Weight: 82.6 kg 80.9 kg 78 kg    Intake/Output: I/O last 3 completed shifts: In: 1800 [P.O.:1800] Out: 5605 [Urine:5605]   Intake/Output this shift:  Total I/O In: 240 [P.O.:240] Out: 600 [Urine:600]  Physical Exam: General: NAD  Head: Normocephalic, atraumatic. Moist oral mucosal membranes  Eyes: Anicteric  Lungs:  Diminished, [place] 02  Heart: Regular rate and rhythm  Abdomen:  Soft, nontender,   Extremities: trace peripheral edema.  Neurologic: Alert and oriented, moving all four extremities  Skin: No lesions  Access: None    Basic Metabolic Panel: Recent Labs  Lab 02/25/23 0933 02/26/23 0538 02/27/23 0510  NA 134* 135 135  K 3.6 3.7 3.6  CL 98 100 95*  CO2  24 26 28   GLUCOSE 114* 92 116*  BUN 54* 53* 58*  CREATININE 3.06* 3.05* 3.02*  CALCIUM 8.7* 9.0 9.1     Liver Function Tests: Recent Labs  Lab 02/25/23 0933 02/25/23 1121 02/26/23 0538 02/27/23 0510  AST 37  --  27 24  ALT 13  --  10 10  ALKPHOS 174*  --  109 117  BILITOT 3.2* 3.0* 3.2* 2.8*  PROT 8.2*  --  6.5 6.4*  ALBUMIN 3.4*  --  2.8* 2.8*    Recent Labs  Lab 02/25/23 0933  LIPASE 46    No results for input(s): "AMMONIA" in the last 168 hours.  CBC: Recent Labs  Lab 02/25/23 0933 02/26/23 0538  WBC 5.3 4.5  NEUTROABS 3.4  --   HGB 12.2* 11.9*  HCT 39.6 37.5*  MCV 102.1* 97.9  PLT 143* 123*     Cardiac Enzymes: No results for input(s): "CKTOTAL", "CKMB", "CKMBINDEX", "TROPONINI" in the last 168 hours.  BNP: Invalid input(s): "POCBNP"  CBG: No results for input(s): "GLUCAP" in the last 168 hours.  Microbiology: Results for orders placed or performed during the hospital encounter of 02/25/23  Resp panel by RT-PCR (RSV, Flu A&B, Covid) Anterior Nasal Swab     Status: None   Collection Time: 02/25/23  9:33 AM   Specimen: Anterior Nasal Swab  Result Value Ref Range Status   SARS Coronavirus 2 by RT PCR NEGATIVE NEGATIVE Final    Comment: (NOTE) SARS-CoV-2 target nucleic acids are NOT DETECTED.  The SARS-CoV-2 RNA is generally detectable  in upper respiratory specimens during the acute phase of infection. The lowest concentration of SARS-CoV-2 viral copies this assay can detect is 138 copies/mL. A negative result does not preclude SARS-Cov-2 infection and should not be used as the sole basis for treatment or other patient management decisions. A negative result may occur with  improper specimen collection/handling, submission of specimen other than nasopharyngeal swab, presence of viral mutation(s) within the areas targeted by this assay, and inadequate number of viral copies(<138 copies/mL). A negative result must be combined with clinical  observations, patient history, and epidemiological information. The expected result is Negative.  Fact Sheet for Patients:  EntrepreneurPulse.com.au  Fact Sheet for Healthcare Providers:  IncredibleEmployment.be  This test is no t yet approved or cleared by the [place] FDA and  has been authorized for detection and/or diagnosis of SARS-CoV-2 by FDA under an Emergency Use Authorization (EUA). This EUA will remain  in effect (meaning this test can be used) for the duration of the COVID-19 declaration under Section 564(b)(1) of the Act, 21 U.S.C.section 360bbb-3(b)(1), unless the authorization is terminated  or revoked sooner.       Influenza A by PCR NEGATIVE NEGATIVE Final   Influenza B by PCR NEGATIVE NEGATIVE Final    Comment: (NOTE) The Xpert Xpress SARS-CoV-2/FLU/RSV plus assay is intended as an aid in the diagnosis of influenza from Nasopharyngeal swab specimens and should not be used as a sole basis for treatment. Nasal washings and aspirates are unacceptable for Xpert Xpress SARS-CoV-2/FLU/RSV testing.  Fact Sheet for Patients: EntrepreneurPulse.com.au  Fact Sheet for Healthcare Providers: IncredibleEmployment.be  This test is not yet approved or cleared by the [place] FDA and has been authorized for detection and/or diagnosis of SARS-CoV-2 by FDA under an Emergency Use Authorization (EUA). This EUA will remain in effect (meaning this test can be used) for the duration of the COVID-19 declaration under Section 564(b)(1) of the Act, 21 U.S.C. section 360bbb-3(b)(1), unless the authorization is terminated or revoked.     Resp Syncytial Virus by PCR NEGATIVE NEGATIVE Final    Comment: (NOTE) Fact Sheet for Patients: EntrepreneurPulse.com.au  Fact Sheet for Healthcare Providers: IncredibleEmployment.be  This test is not yet approved or cleared by  the [place] FDA and has been authorized for detection and/or diagnosis of SARS-CoV-2 by FDA under an Emergency Use Authorization (EUA). This EUA will remain in effect (meaning this test can be used) for the duration of the COVID-19 declaration under Section 564(b)(1) of the Act, 21 U.S.C. section 360bbb-3(b)(1), unless the authorization is terminated or revoked.  Performed at [facility], [address] [place], [place] [address]   Body fluid culture w Gram Stain     Status: None (Preliminary result)   Collection Time: 02/25/23  3:07 PM   Specimen: PATH Cytology Peritoneal fluid  Result Value Ref Range Status   Specimen Description   Final    FLUID Performed at [facility], [address] [place], [place] [address]    Special Requests   Final    CYTO PERI Performed at [facility], [address] [place], [place] [address]    Gram Stain   Final    FEW WBC PRESENT, PREDOMINANTLY MONONUCLEAR NO ORGANISMS SEEN    Culture   Final    NO GROWTH 2 DAYS Performed at [facility], [place] [address] [place], [place] [address]    Report Status PENDING  Incomplete    Coagulation Studies: No results for input(s): "LABPROT", "  INR" in the last 72 hours.  Urinalysis: Recent Labs    02/25/23 0933  COLORURINE YELLOW*  LABSPEC 1.006  PHURINE 5.0  GLUCOSEU 50*  HGBUR NEGATIVE  BILIRUBINUR NEGATIVE  KETONESUR NEGATIVE  PROTEINUR NEGATIVE  NITRITE NEGATIVE  LEUKOCYTESUR NEGATIVE       Imaging: US Paracentesis  Result Date: 02/25/2023 INDICATION: CHF exacerbation with ascites EXAM: ULTRASOUND GUIDED diagnostic and therapeutic PARACENTESIS MEDICATIONS: 5 cc 1% lidocaine COMPLICATIONS: None immediate. PROCEDURE: Informed written consent was obtained from the patient after a discussion of the risks, benefits and alternatives to treatment. A timeout was performed prior to the initiation of the procedure. Initial ultrasound scanning  demonstrates a large amount of ascites within the right lower abdominal quadrant. The right lower abdomen was prepped and draped in the usual sterile fashion. 1% lidocaine was used for local anesthesia. Following this, a 19 gauge, 7-cm, Yueh catheter was introduced. An ultrasound image was saved for documentation purposes. The paracentesis was performed. The catheter was removed and a dressing was applied. The patient tolerated the procedure well without immediate post procedural complication. Patient received post-procedure intravenous albumin; see nursing notes for details. FINDINGS: A total of approximately 6 L of amber fluid was removed. Samples were sent to the laboratory as requested by the clinical team. IMPRESSION: Successful ultrasound-guided paracentesis yielding 6 liters of peritoneal fluid. PLAN: If the patient eventually requires >/=2 paracenteses in a 30 day period, candidacy for formal evaluation by the [facility] will be assessed. Read by: Reatha Armour, PA-C Electronically Signed   By: Corrie Mckusick D.O.   On: 02/25/2023 16:14     Medications:     apixaban  2.5 mg Oral BID   aspirin  81 mg Oral Daily   atorvastatin  40 mg Oral Daily   carvedilol  6.25 mg Oral BID WC   empagliflozin  10 mg Oral Daily   isosorbide mononitrate  15 mg Oral Daily   torsemide  40 mg Oral QHS   [START ON 02/28/2023] torsemide  60 mg Oral q AM   ondansetron **OR** ondansetron (ZOFRAN) IV  Assessment/ Plan:  [name] is a 82 y.o.  male with past medical conditions including CAD, AICD, gout, bladder cancer, AAA, CVA, hypertension, Barrett's esophagus, ischemic cardiomyopathy, gout, and chronic kidney disease stage III.  Patient presents to the emergency department complaining of shortness of breath.  Patient has been admitted for Other ascites [R18.8] Acute respiratory failure with hypoxia [J96.01] Congestive heart failure, unspecified HF  chronicity, unspecified heart failure type [I50.9]   Acute Kidney Injury on chronic kidney disease stage IIIb with baseline creatinine 2.2 and GFR of 30 on 01/13/23.  Acute kidney injury secondary to cardiorenal syndrome Chronic kidney disease is secondary to diabetes and hypertension.  No IV contrast exposure. Creatinine 3.06 on ED admission.  Patient has received diuresis during this admission.   Creatinine remains stable, good urine output with prescribed diuresis. Will continue to monitor patient outpatient.  Lab Results  Component Value Date   CREATININE 3.02 (H) 02/27/2023   CREATININE 3.05 (H) 02/26/2023   CREATININE 3.06 (H) 02/25/2023    Intake/Output Summary (Last 24 hours) at 02/27/2023 1326 Last data filed at 02/27/2023 1025 Gross per 24 hour  Intake 1080 ml  Output 3850 ml  Net -2770 ml    2.  Acute respiratory failure due to acute on chronic combined heart failure.  Echo from 10/30/2022 shows EF less than 20% with a grade 2 diastolic  dysfunction, severe MVR, moderate TVR, and mild AVR.  Weaned to room air  3. Anemia of chronic kidney disease Lab Results  Component Value Date   HGB 11.9 (L) 02/26/2023    Hemoglobin stable  4. Diabetes mellitus type II with chronic kidney disease/renal manifestations:noninsulin dependent. Home regimen includes Jardiance. Most recent hemoglobin A1c is 6.5 on 12/24/22.  Jardiance restarted today.   5.  Hypertension with chronic kidney disease.  Home regimen includes carvedilol, isosorbide, losartan, and torsemide.  Also prescribe diuretics of furosemide and metolazone.  Metolazone currently held.  Receiving carvedilol and isosorbide.    LOS: 2 Danikah Budzik 4/4/20241:26 PM

## 2023-02-27 NOTE — Discharge Summary (Signed)
Physician Discharge Summary   Patient: Taylor [name] MRN: [id number]  DOB: 26-Sep-1941   Admit:     Date of Admission: 02/25/2023 Admitted from: home   Discharge: Date of discharge: 02/27/23 Disposition: Home Condition at discharge: good  CODE STATUS: FULL CODE      Discharge Physician: Emeterio Reeve, DO [facility]     PCP: Birdie Sons, MD  Recommendations for Outpatient Follow-up:  Follow up with PCP Birdie Sons, MD in 1 weeks Please obtain labs/tests: CBC, BMP in 1 week or sooner with nephrology  Please follow up on the following pending results: none PCP AND OTHER OUTPATIENT PROVIDERS: SEE BELOW FOR SPECIFIC DISCHARGE INSTRUCTIONS PRINTED FOR PATIENT IN ADDITION TO GENERIC AVS PATIENT INFO  Follow as directed w/ cardiology    Discharge Instructions     Diet - low sodium heart healthy   Complete by: As directed    Discharge instructions   Complete by: As directed    Given kidney function, we are HOLDING your losartan and your allopurinol until your blood work can be rechecked outpatient. See medication list for details. OK to stop the antibiotics for the pacemaker. Your carvedilol dose has been reduced.   Increase activity slowly   Complete by: As directed          Discharge Diagnoses: Principal Problem:   Acute on chronic respiratory failure with hypoxia Active Problems:   Ascites   Chronic kidney disease (CKD), stage III (moderate)   Systolic CHF with reduced left ventricular function, NYHA class 3   Hyperbilirubinemia   CAD S/P percutaneous coronary angioplasty   Essential (primary) hypertension   Atrial fibrillation       Hospital Course: KAMRAN ROBSON is a 82 y.o. male with medical history significant of HFrEF, Barrett esophagus, CAD s/p stent placement, s/p AICD, CKDIII, DDD, gout, bladder cancer, HLD, pacemaker placed, psoriasis, AAA, h/o CVA, HTN, ischemic cardiomyopathy, h/o ventricular fibrillation, restrictive lung  disease, pleural nodule, h/o pancreatitis, Afib (on eliquis), Type II DM, carotid stenosis presenting with acute on chronic respiratory with hypoxia, ascites, elevated bilirubin.  Wife and patient report progressive increased work of breathing over multiple weeks.  Was evaluated at [facility] clinic cardiology day prior to ED visit.  On torsemide 40 mg daily and  Zaroxolyn 5 mg were added to regimen to help with urine output.  Patient reports being prescribed home oxygen in the past.  However, he does not wear it on a regular basis. 04/02: Presented to the ER afebrile, hemodynamically stable.  O2 sats into the mid 80s RA.  Transition to 5 to 6 L nasal cannula.  White count 5.3, hemoglobin 12, platelets 143, troponin negative x 2.  T. bili 3, creatinine 3.06, GFR 20, BNP 1076. Chest x-ray with enlargement of cardiac silhouette and mild pulmonary edema.  CT of the chest abdomen pelvis with moderate to severe ascites with anasarca and mesenteric stranding. O2 requirement of roughly 5 to 6 L in the setting of CHF and ascites, also AKI on CKD3b. Admitted to hospitalist service. Paracentesis yielded 6L fluids, labs pending  04/03: Cr stable at 3.05, bilirubin about same at 3.2. Continuing diuresis per cardiology/nephrology. On 6L [place] O2 04/04: on room air. Net IO Since Admission: -4,229.94 mL [02/27/23 0857].   Consultants:  Nephrology  Cardiology   Procedures: Paracentesis w/ IR 02/25/2023      ASSESSMENT & PLAN:   Principal Problem:   Acute on chronic respiratory failure with hypoxia Active Problems:  Ascites   Chronic kidney disease (CKD), stage III (moderate)   Systolic CHF with reduced left ventricular function, NYHA class 3   Hyperbilirubinemia   CAD S/P percutaneous coronary angioplasty   Essential (primary) hypertension   Atrial fibrillation  Acute on chronic respiratory failure with hypoxia D/t CHF, ascites  Supplemental O2 to wean as able  Treat underlying causes as below   AKI on  CKD3b vs new CKD4 Noted creatinine of 3 with GFR in the 20s Suspect volume overload in setting of CHF with poor renal function as likely etiology Nephrology has been consulted  Close monitor BMP , avoid IV fluids d/t CHF/ascites  Continue diuresis per nephrology    Ascites S/p paracentesis --> 6L ascitic fluid, labs pending  SAAG > 1.1 g/dL = Portal hypertension is likely the cause of ascites, with 97% accuracy. Suspect significant hepatic congestion with third spacing d/t CHF Follow paracentesis labs  Continue diuresis Albumin infusion    Systolic CHF with reduced left ventricular function, NYHA class 3 2d ECHO 10/2022 w 2D echocardiogram revealed LVEF less than 20% with global hypokinesis  Diuresis --> Lasix 60 mg IV twice daily, good UOP and Cr stable I&O, fluid restriction, daily weights  Cardiology and nephrology following  Continue Imdur 15 mg daily Continue home GDMT of carvedilol, but reduce dose to 6.25 mg twice daily  hold Jardiance in the setting of AKI/CKD as below  Defer repeat echo with recent study   Hyperbilirubinemia T bili 3.2 POA Baseline appears to be around 2.2-2.6  LFTs WNL No GB disease on imaging  Suspect vascular congestion in setting of ascites Checking fractionated bilirubin  Monitor-reassess as clinically appropriate   Paroxysmal Atrial fibrillation Rate controlled atrial fibrillation on EKG rate control w/ Coreg  AC w/ Eliquis     Essential (primary) hypertension BP stable Titrate home regimen with diuresis   CAD S/P percutaneous coronary angioplasty S/p ICD with recent battery change out 02/13/2023  No active chest pain Troponin negative x 2 EKG with stable atrial fibrillation, heart rate in the 70s Contineu ASA, statin  Noted swelling under the pulse generator pocket incision. Likely hematoma formation post procedurally since on eliquis. Continue monitoring for now        DVT prophylaxis: on Eliquis for Afib  Pertinent IV  fluids/nutrition: no continuous IV fluids, fluid restricted low sodium diet  Central lines / invasive devices: none  Code Status: FULL CODE   Current Admission Status: inpatient   TOC needs / Dispo plan: TBD  Barriers to discharge / significant pending items: diuresing, monitoring renal function, expect will be here 2 more days or so             Discharge Instructions  Allergies as of 02/27/2023       Reactions   Amlodipine Besylate Swelling   Had a reaction when taking with colcrys    Crestor [rosuvastatin]    Muscle cramps and pain   Rocephin [ceftriaxone]    unknown        Medication List     STOP taking these medications    allopurinol 300 MG tablet Commonly known as: ZYLOPRIM   losartan 50 MG tablet Commonly known as: COZAAR   metolazone 5 MG tablet Commonly known as: ZAROXOLYN       TAKE these medications    albuterol 108 (90 Base) MCG/ACT inhaler Commonly known as: VENTOLIN HFA Inhale 2 puffs into the lungs every 6 (six) hours as needed for wheezing or shortness of breath.  apixaban 2.5 MG Tabs tablet Commonly known as: ELIQUIS Take 2 tablets (5 mg total) by mouth 2 (two) times daily.   aspirin EC 81 MG tablet Take 81 mg by mouth daily.   atorvastatin 40 MG tablet Commonly known as: LIPITOR TAKE 1 TABLET(40 MG) BY MOUTH DAILY   benzonatate 200 MG capsule Commonly known as: TESSALON Take by mouth.   carvedilol 6.25 MG tablet Commonly known as: COREG Take 1 tablet (6.25 mg total) by mouth 2 (two) times daily with a meal. What changed:  medication strength how much to take additional instructions   clarithromycin 500 MG 24 hr tablet Commonly known as: BIAXIN XL Take 2 tablets (1,000 mg total) by mouth daily for 14 days.   ipratropium-albuterol 0.5-2.5 (3) MG/3ML Soln Commonly known as: DUONEB Inhale into the lungs.   isosorbide mononitrate 30 MG 24 hr tablet Commonly known as: IMDUR TAKE 1/2 TABLET(15 MG) BY MOUTH DAILY    Jardiance 10 MG Tabs tablet Generic drug: empagliflozin Take by mouth daily.   Magnesium 200 MG Tabs Take 1 tablet (200 mg total) by mouth in the morning and at bedtime.   pantoprazole 40 MG tablet Commonly known as: PROTONIX TAKE 1 TABLET BY MOUTH EVERY DAY   potassium chloride SA 20 MEQ tablet Commonly known as: KLOR-CON M Take 20 mEq by mouth daily.   torsemide 20 MG tablet Commonly known as: DEMADEX 60 mg (3 tablets) every morning and 40 mg (2 tablets) every afternoon/evening What changed:  how much to take how to take this when to take this additional instructions   triamcinolone ointment 0.1 % Commonly known as: KENALOG APPLY TOPICALLY TWICE DAILY AS DIRECTED         Follow-up Information     Scheryl Marten, PA-C. Go in 1 week(s).   Specialty: Cardiology Contact information: [place] [address] [phone]                 Allergies  Allergen Reactions   Amlodipine Besylate Swelling    Had a reaction when taking with colcrys    Crestor [Rosuvastatin]     Muscle cramps and pain   Rocephin [Ceftriaxone]     unknown     Subjective: pt feeling well this morning, no CP/SOB, on room air, would like to go home soon if possible!   Discharge Exam: BP 108/79   Pulse 71   Temp 98 F (36.7 C)   Resp 18   Ht 5\' 10"  (1.778 m)   Wt 78 kg   SpO2 96%   BMI 24.67 kg/m   General: Pt is alert, awake, not in acute distress Cardiovascular: RRR, S1/S2 +, no rubs, no gallops Respiratory: CTA bilaterally, no wheezing, no rhonchi Abdominal: Soft, NT, ND, bowel sounds + Extremities: no edema, no cyanosis     The results of significant diagnostics from this hospitalization (including imaging, microbiology, ancillary and laboratory) are listed below for reference.     Microbiology: Recent Results (from the past 240 hour(s))  Resp panel by RT-PCR (RSV, Flu A&B, Covid) Anterior Nasal Swab     Status: None   Collection  Time: 02/25/23  9:33 AM   Specimen: Anterior Nasal Swab  Result Value Ref Range Status   SARS Coronavirus 2 by RT PCR NEGATIVE NEGATIVE Final    Comment: (NOTE) SARS-CoV-2 target nucleic acids are NOT DETECTED.  The SARS-CoV-2 RNA is generally detectable in upper respiratory specimens during the acute phase of infection. The lowest concentration  of SARS-CoV-2 viral copies this assay can detect is 138 copies/mL. A negative result does not preclude SARS-Cov-2 infection and should not be used as the sole basis for treatment or other patient management decisions. A negative result may occur with  improper specimen collection/handling, submission of specimen other than nasopharyngeal swab, presence of viral mutation(s) within the areas targeted by this assay, and inadequate number of viral copies(<138 copies/mL). A negative result must be combined with clinical observations, patient history, and epidemiological information. The expected result is Negative.  Fact Sheet for Patients:  EntrepreneurPulse.com.au  Fact Sheet for Healthcare Providers:  IncredibleEmployment.be  This test is no t yet approved or cleared by the [place] FDA and  has been authorized for detection and/or diagnosis of SARS-CoV-2 by FDA under an Emergency Use Authorization (EUA). This EUA will remain  in effect (meaning this test can be used) for the duration of the COVID-19 declaration under Section 564(b)(1) of the Act, 21 U.S.C.section 360bbb-3(b)(1), unless the authorization is terminated  or revoked sooner.       Influenza A by PCR NEGATIVE NEGATIVE Final   Influenza B by PCR NEGATIVE NEGATIVE Final    Comment: (NOTE) The Xpert Xpress SARS-CoV-2/FLU/RSV plus assay is intended as an aid in the diagnosis of influenza from Nasopharyngeal swab specimens and should not be used as a sole basis for treatment. Nasal washings and aspirates are unacceptable for Xpert Xpress  SARS-CoV-2/FLU/RSV testing.  Fact Sheet for Patients: EntrepreneurPulse.com.au  Fact Sheet for Healthcare Providers: IncredibleEmployment.be  This test is not yet approved or cleared by the [place] FDA and has been authorized for detection and/or diagnosis of SARS-CoV-2 by FDA under an Emergency Use Authorization (EUA). This EUA will remain in effect (meaning this test can be used) for the duration of the COVID-19 declaration under Section 564(b)(1) of the Act, 21 U.S.C. section 360bbb-3(b)(1), unless the authorization is terminated or revoked.     Resp Syncytial Virus by PCR NEGATIVE NEGATIVE Final    Comment: (NOTE) Fact Sheet for Patients: EntrepreneurPulse.com.au  Fact Sheet for Healthcare Providers: IncredibleEmployment.be  This test is not yet approved or cleared by the [place] FDA and has been authorized for detection and/or diagnosis of SARS-CoV-2 by FDA under an Emergency Use Authorization (EUA). This EUA will remain in effect (meaning this test can be used) for the duration of the COVID-19 declaration under Section 564(b)(1) of the Act, 21 U.S.C. section 360bbb-3(b)(1), unless the authorization is terminated or revoked.  Performed at [facility], [address] [place], [place] [address]   Body fluid culture w Gram Stain     Status: None (Preliminary result)   Collection Time: 02/25/23  3:07 PM   Specimen: PATH Cytology Peritoneal fluid  Result Value Ref Range Status   Specimen Description   Final    FLUID Performed at [facility], [address] [place], [place] [address]    Special Requests   Final    CYTO PERI Performed at [facility], [address] [place], [place] [address]    Gram Stain   Final    FEW WBC PRESENT, PREDOMINANTLY MONONUCLEAR NO ORGANISMS SEEN    Culture   Final    NO GROWTH 2 DAYS Performed at [facility], [place] [address], [place], [place] [address]    Report Status PENDING  Incomplete     Labs: BNP (last 3 results) Recent Labs    12/24/22 0928 01/03/23 1111 02/25/23 0933  BNP 995.3*  632.9* [id number]*   Basic Metabolic Panel: Recent Labs  Lab 02/25/23 0933 02/26/23 0538 02/27/23 0510  NA 134* 135 135  K 3.6 3.7 3.6  CL 98 100 95*  CO2 24 26 28   GLUCOSE 114* 92 116*  BUN 54* 53* 58*  CREATININE 3.06* 3.05* 3.02*  CALCIUM 8.7* 9.0 9.1   Liver Function Tests: Recent Labs  Lab 02/25/23 0933 02/25/23 1121 02/26/23 0538 02/27/23 0510  AST 37  --  27 24  ALT 13  --  10 10  ALKPHOS 174*  --  109 117  BILITOT 3.2* 3.0* 3.2* 2.8*  PROT 8.2*  --  6.5 6.4*  ALBUMIN 3.4*  --  2.8* 2.8*   Recent Labs  Lab 02/25/23 0933  LIPASE 46   No results for input(s): "AMMONIA" in the last 168 hours. CBC: Recent Labs  Lab 02/25/23 0933 02/26/23 0538  WBC 5.3 4.5  NEUTROABS 3.4  --   HGB 12.2* 11.9*  HCT 39.6 37.5*  MCV 102.1* 97.9  PLT 143* 123*   Cardiac Enzymes: No results for input(s): "CKTOTAL", "CKMB", "CKMBINDEX", "TROPONINI" in the last 168 hours. BNP: Invalid input(s): "POCBNP" CBG: No results for input(s): "GLUCAP" in the last 168 hours. D-Dimer No results for input(s): "DDIMER" in the last 72 hours. Hgb A1c No results for input(s): "HGBA1C" in the last 72 hours. Lipid Profile No results for input(s): "CHOL", "HDL", "LDLCALC", "TRIG", "CHOLHDL", "LDLDIRECT" in the last 72 hours. Thyroid function studies No results for input(s): "TSH", "T4TOTAL", "T3FREE", "THYROIDAB" in the last 72 hours.  Invalid input(s): "FREET3" Anemia work up No results for input(s): "VITAMINB12", "FOLATE", "FERRITIN", "TIBC", "IRON", "RETICCTPCT" in the last 72 hours. Urinalysis    Component Value Date/Time   COLORURINE YELLOW (A) 02/25/2023 0933   APPEARANCEUR CLEAR (A) 02/25/2023 0933   LABSPEC 1.006 02/25/2023 0933   PHURINE 5.0 02/25/2023 0933   GLUCOSEU 50 (A) 02/25/2023 0933    HGBUR NEGATIVE 02/25/2023 0933   BILIRUBINUR NEGATIVE 02/25/2023 0933   BILIRUBINUR negative 03/31/2018 1452   KETONESUR NEGATIVE 02/25/2023 0933   PROTEINUR NEGATIVE 02/25/2023 0933   UROBILINOGEN 0.2 03/31/2018 1452   NITRITE NEGATIVE 02/25/2023 0933   LEUKOCYTESUR NEGATIVE 02/25/2023 0933   Sepsis Labs Recent Labs  Lab 02/25/23 0933 02/26/23 0538  WBC 5.3 4.5   Microbiology Recent Results (from the past 240 hour(s))  Resp panel by RT-PCR (RSV, Flu A&B, Covid) Anterior Nasal Swab     Status: None   Collection Time: 02/25/23  9:33 AM   Specimen: Anterior Nasal Swab  Result Value Ref Range Status   SARS Coronavirus 2 by RT PCR NEGATIVE NEGATIVE Final    Comment: (NOTE) SARS-CoV-2 target nucleic acids are NOT DETECTED.  The SARS-CoV-2 RNA is generally detectable in upper respiratory specimens during the acute phase of infection. The lowest concentration of SARS-CoV-2 viral copies this assay can detect is 138 copies/mL. A negative result does not preclude SARS-Cov-2 infection and should not be used as the sole basis for treatment or other patient management decisions. A negative result may occur with  improper specimen collection/handling, submission of specimen other than nasopharyngeal swab, presence of viral mutation(s) within the areas targeted by this assay, and inadequate number of viral copies(<138 copies/mL). A negative result must be combined with clinical observations, patient history, and epidemiological information. The expected result is Negative.  Fact Sheet for Patients:  EntrepreneurPulse.com.au  Fact Sheet for Healthcare Providers:  IncredibleEmployment.be  This test is no t yet approved or cleared by the  [place] Architectural technologist and  has been authorized for detection and/or diagnosis of SARS-CoV-2 by FDA under an Print production planner (EUA). This EUA will remain  in effect (meaning this test can be used) for the  duration of the COVID-19 declaration under Section 564(b)(1) of the Act, 21 U.S.C.section 360bbb-3(b)(1), unless the authorization is terminated  or revoked sooner.       Influenza A by PCR NEGATIVE NEGATIVE Final   Influenza B by PCR NEGATIVE NEGATIVE Final    Comment: (NOTE) The Xpert Xpress SARS-CoV-2/FLU/RSV plus assay is intended as an aid in the diagnosis of influenza from Nasopharyngeal swab specimens and should not be used as a sole basis for treatment. Nasal washings and aspirates are unacceptable for Xpert Xpress SARS-CoV-2/FLU/RSV testing.  Fact Sheet for Patients: EntrepreneurPulse.com.au  Fact Sheet for Healthcare Providers: IncredibleEmployment.be  This test is not yet approved or cleared by the [place] FDA and has been authorized for detection and/or diagnosis of SARS-CoV-2 by FDA under an Emergency Use Authorization (EUA). This EUA will remain in effect (meaning this test can be used) for the duration of the COVID-19 declaration under Section 564(b)(1) of the Act, 21 U.S.C. section 360bbb-3(b)(1), unless the authorization is terminated or revoked.     Resp Syncytial Virus by PCR NEGATIVE NEGATIVE Final    Comment: (NOTE) Fact Sheet for Patients: EntrepreneurPulse.com.au  Fact Sheet for Healthcare Providers: IncredibleEmployment.be  This test is not yet approved or cleared by the [place] FDA and has been authorized for detection and/or diagnosis of SARS-CoV-2 by FDA under an Emergency Use Authorization (EUA). This EUA will remain in effect (meaning this test can be used) for the duration of the COVID-19 declaration under Section 564(b)(1) of the Act, 21 U.S.C. section 360bbb-3(b)(1), unless the authorization is terminated or revoked.  Performed at [facility], [address] [place], [place] [address]   Body fluid culture w Gram Stain     Status: None  (Preliminary result)   Collection Time: 02/25/23  3:07 PM   Specimen: PATH Cytology Peritoneal fluid  Result Value Ref Range Status   Specimen Description   Final    FLUID Performed at [facility], [address], [place], [place] [address]    Special Requests   Final    CYTO PERI Performed at [facility], [address] [place], [place] [address]    Gram Stain   Final    FEW WBC PRESENT, PREDOMINANTLY MONONUCLEAR NO ORGANISMS SEEN    Culture   Final    NO GROWTH 2 DAYS Performed at [facility], [place] [address], [place], [place] [address]    Report Status PENDING  Incomplete   Imaging US Paracentesis  Result Date: 02/25/2023 INDICATION: CHF exacerbation with ascites EXAM: ULTRASOUND GUIDED diagnostic and therapeutic PARACENTESIS MEDICATIONS: 5 cc 1% lidocaine COMPLICATIONS: None immediate. PROCEDURE: Informed written consent was obtained from the patient after a discussion of the risks, benefits and alternatives to treatment. A timeout was performed prior to the initiation of the procedure. Initial ultrasound scanning demonstrates a large amount of ascites within the right lower abdominal quadrant. The right lower abdomen was prepped and draped in the usual sterile fashion. 1% lidocaine was used for local anesthesia. Following this, a 19 gauge, 7-cm, Yueh catheter was introduced. An ultrasound image was saved for documentation purposes. The paracentesis was performed. The catheter was removed and a dressing was applied. The patient tolerated the procedure well without immediate post procedural complication. Patient received  post-procedure intravenous albumin; see nursing notes for details. FINDINGS: A total of approximately 6 L of amber fluid was removed. Samples were sent to the laboratory as requested by the clinical team. IMPRESSION: Successful ultrasound-guided paracentesis yielding 6 liters of peritoneal fluid. PLAN: If the patient eventually requires >/=2  paracenteses in a 30 day period, candidacy for formal evaluation by the [facility] will be assessed. Read by: Reatha Armour, PA-C Electronically Signed   By: Corrie Mckusick D.O.   On: 02/25/2023 16:14   CT CHEST ABDOMEN PELVIS WO CONTRAST  Result Date: 02/25/2023 CLINICAL DATA:  Severe shortness of breath. History of COPD. Sepsis EXAM: CT CHEST, ABDOMEN AND PELVIS WITHOUT CONTRAST TECHNIQUE: Multidetector CT imaging of the chest, abdomen and pelvis was performed following the standard protocol without IV contrast. RADIATION DOSE REDUCTION: This exam was performed according to the departmental dose-optimization program which includes automated exposure control, adjustment of the mA and/or kV according to patient size and/or use of iterative reconstruction technique. COMPARISON:  Abdomen pelvis CT 10/27/2022. Older exams. Chest x-ray earlier 02/25/2023 and older FINDINGS: CT CHEST FINDINGS Cardiovascular: Status post median sternotomy. There is a left chest battery pack with leads along the right side of the heart. The heart itself is enlarged. Trace pericardial fluid. On this non IV contrast exam, the thoracic aorta has a normal course and caliber with scattered vascular calcifications. Significant coronary artery calcifications are identified. Mediastinum/Nodes: Slightly patulous esophagus. Small thyroid gland. No specific abnormal lymph node enlargement seen in the axillary region. There are some calcified nodes in the left hilum. There are some small mediastinal lymph nodes. These are slightly more numerous than usually seen but less than 1 cm in short axis and not pathologic by size criteria. Lungs/Pleura: There is a small right pleural effusion. There are areas of partially calcified pleural plaques bilaterally. No left-sided significant pleural effusion. Some breathing motion. No separate consolidation or pneumothorax. Healed multiple left-sided rib  fractures. Musculoskeletal: Scattered degenerative changes of the spine. CT ABDOMEN PELVIS FINDINGS Hepatobiliary: On this non IV contrast exam, the liver is somewhat small. No obvious mass lesion. The gallbladder is nondilated. Pancreas: Moderate atrophy of the pancreas. Spleen: Spleen is nonenlarged. Adrenals/Urinary Tract: Adrenal glands are preserved. There is severe atrophy of the left kidney. No abnormal calcifications are seen within either kidney nor along the course of either ureter. Small hyperdense focus along the upper pole of the right kidney measuring 7 mm. This is unchanged from previous examination and could be a hyperdense lesion. This has been present since at least January 2021 demonstrating long-term stability. No additional follow-up. Stomach/Bowel: On this non oral contrast exam, large bowel has a normal course and caliber with scattered stool. Left-sided colonic diverticula. Stomach is collapsed. Small bowel is nondilated. Third portion duodenal diverticulum. Vascular/Lymphatic: Aortic endograft in place. Evaluation of the endograft is limited without the advantage of IV contrast but grossly preserved contour. Preserved IVC. No discrete abnormal lymph node enlargement identified in the abdomen and pelvis. Reproductive: Prominent prostate. Other: Moderate-to-large amount of ascites, new from previous. Diffuse mesenteric stranding and anasarca. There is also bilateral fat containing inguinal hernias. Fluid tracks along the right inguinal canal. Musculoskeletal: Curvature of the spine. Degenerative changes of the spine and pelvis. Multilevel stenosis. Particularly at L2-3 and L1-2. IMPRESSION: Moderate to severe ascites with anasarca mesenteric stranding. No bowel obstruction, free air.  Left-sided colonic diverticula. Aortic endograft. Severe atrophy of the left kidney. Small right pleural effusion. Bilateral partially calcified pleural  plaques. Please correlate with any prior environmental  exposure such as a specialist Postop chest with enlarged heart and defibrillator. Electronically Signed   By: Jill Side M.D.   On: 02/25/2023 11:22   DG Chest Portable 1 View  Result Date: 02/25/2023 CLINICAL DATA:  Shortness of breath, cough, COPD EXAM: PORTABLE CHEST 1 VIEW COMPARISON:  Portable exam 0930 hours compared to 02/24/2023 FINDINGS: LEFT subclavian ICD with leads projecting over RIGHT atrium and RIGHT ventricle. Enlargement of cardiac silhouette post CABG. Atherosclerotic calcification aorta. Minimal bibasilar atelectasis. Increased interstitial markings similar to previous exam question mild pulmonary edema. No pleural effusion or pneumothorax. IMPRESSION: Enlargement of cardiac silhouette post CABG and ICD with question mild pulmonary edema. Minimal bibasilar atelectasis. Aortic Atherosclerosis (ICD10-I70.0). Electronically Signed   By: Lavonia Dana M.D.   On: 02/25/2023 09:42      Time coordinating discharge: over 30 minutes  SIGNED:  Emeterio Reeve DO [facility]

## 2023-02-28 ENCOUNTER — Ambulatory Visit: Payer: Self-pay

## 2023-02-28 ENCOUNTER — Telehealth: Payer: Self-pay | Admitting: *Deleted

## 2023-02-28 LAB — TOTAL BILIRUBIN, BODY FLUID: Total bilirubin, fluid: 1.3 mg/dL

## 2023-02-28 NOTE — Chronic Care Management (AMB) (Signed)
   02/28/2023  Taylor [name] [birth year] [id number]   Reason for Encounter: Patient is not currently enrolled in the CCM program. CCM status changed to previously enrolled.   Katha Cabal RN Care Manager/Chronic Care Management [phone]

## 2023-02-28 NOTE — Transitions of Care (Post Inpatient/ED Visit) (Signed)
   02/28/2023  Name: Taylor [name] MRN: [id number] DOB: 1941-01-07  Today's TOC FU Call Status: Today's TOC FU Call Status:: Successful TOC FU Call Competed TOC FU Call Complete Date: 02/28/23  Transition Care Management Follow-up Telephone Call Date of Discharge: 02/27/23 Discharge Facility: [facility]) Type of Discharge: Inpatient Admission Primary Inpatient Discharge Diagnosis:: Acute on chronic respiratory failure with hypoxia How have you been since you were released from the hospital?: Better Any questions or concerns?: No  Items Reviewed: Did you receive and understand the discharge instructions provided?: Yes Medications obtained and verified?: Yes (Medications Reviewed) Any new allergies since your discharge?: No Dietary orders reviewed?: No Do you have support at home?: Yes People in Home: spouse Name of Support/Comfort Primary Source: [facility] and Equipment/Supplies: Were Home Health Services Ordered?: No Any new equipment or medical supplies ordered?: No  Functional Questionnaire: Do you need assistance with bathing/showering or dressing?: No Do you need assistance with meal preparation?: Yes Do you need assistance with eating?: No Do you have difficulty maintaining continence: No Do you need assistance with getting out of bed/getting out of a chair/moving?: No Do you have difficulty managing or taking your medications?: No  Follow up appointments reviewed: PCP Follow-up appointment confirmed?: Yes Date of PCP follow-up appointment?: 03/05/23 Follow-up Provider: Dr Sherrie Mustache 9 AM Specialist Hospital Follow-up appointment confirmed?: Yes Date of Specialist follow-up appointment?: 03/04/23 Follow-Up Specialty Provider:: [id number] Nephrology, [id number] Clarisa Kindred, [id number] Dr Darrold Junker Do you need transportation to your follow-up appointment?: No Do you understand care options if your condition(s) worsen?: Yes-patient verbalized  understanding  SDOH Interventions Today    Flowsheet Row Most Recent Value  SDOH Interventions   Food Insecurity Interventions Intervention Not Indicated  Housing Interventions Intervention Not Indicated  Transportation Interventions Intervention Not Indicated      Interventions Today    Flowsheet Row Most Recent Value  General Interventions   General Interventions Discussed/Reviewed General Interventions Discussed, General Interventions Reviewed, Doctor Visits  [facility] made Care Coordinator nurse aware patient had been in hospital]  Doctor Visits Discussed/Reviewed Doctor Visits Discussed, Doctor Visits Reviewed  Pharmacy Interventions   Pharmacy Dicussed/Reviewed Pharmacy Topics Discussed, Pharmacy Topics Reviewed  [Given kidney function, HOLDING losartan and allopurinol until your blood work can be  rechecked outpatient. See medication list for details. OK to stop the antibiotics for the pacemaker. Your  carvedilol dose has been reduced]      TOC Interventions Today    Flowsheet Row Most Recent Value  TOC Interventions   TOC Interventions Discussed/Reviewed Arranged PCP follow up within 7 days/Care Guide scheduled     Patient is followed by Care Coordinator Lanny Hurst Letisia Schwalb BSN RN [facility] [phone]

## 2023-03-01 LAB — BODY FLUID CULTURE W GRAM STAIN: Culture: NO GROWTH

## 2023-03-02 DIAGNOSIS — J4489 Other specified chronic obstructive pulmonary disease: Secondary | ICD-10-CM | POA: Diagnosis not present

## 2023-03-04 DIAGNOSIS — U071 COVID-19: Secondary | ICD-10-CM | POA: Diagnosis not present

## 2023-03-04 DIAGNOSIS — L408 Other psoriasis: Secondary | ICD-10-CM | POA: Diagnosis not present

## 2023-03-04 DIAGNOSIS — E1122 Type 2 diabetes mellitus with diabetic chronic kidney disease: Secondary | ICD-10-CM | POA: Diagnosis not present

## 2023-03-04 DIAGNOSIS — N184 Chronic kidney disease, stage 4 (severe): Secondary | ICD-10-CM | POA: Diagnosis not present

## 2023-03-04 DIAGNOSIS — I25119 Atherosclerotic heart disease of native coronary artery with unspecified angina pectoris: Secondary | ICD-10-CM | POA: Diagnosis not present

## 2023-03-04 DIAGNOSIS — I10 Essential (primary) hypertension: Secondary | ICD-10-CM | POA: Diagnosis not present

## 2023-03-04 DIAGNOSIS — M1000 Idiopathic gout, unspecified site: Secondary | ICD-10-CM | POA: Diagnosis not present

## 2023-03-04 DIAGNOSIS — Z95810 Presence of automatic (implantable) cardiac defibrillator: Secondary | ICD-10-CM | POA: Diagnosis not present

## 2023-03-04 DIAGNOSIS — J9621 Acute and chronic respiratory failure with hypoxia: Secondary | ICD-10-CM | POA: Diagnosis not present

## 2023-03-04 DIAGNOSIS — T82898A Other specified complication of vascular prosthetic devices, implants and grafts, initial encounter: Secondary | ICD-10-CM | POA: Diagnosis not present

## 2023-03-04 DIAGNOSIS — N261 Atrophy of kidney (terminal): Secondary | ICD-10-CM | POA: Diagnosis not present

## 2023-03-04 DIAGNOSIS — I509 Heart failure, unspecified: Secondary | ICD-10-CM | POA: Diagnosis not present

## 2023-03-04 DIAGNOSIS — E7800 Pure hypercholesterolemia, unspecified: Secondary | ICD-10-CM | POA: Diagnosis not present

## 2023-03-04 NOTE — Progress Notes (Signed)
Vivien Rota DeSanto,acting as a scribe for Mila Merry, MD.,have documented all relevant documentation on the behalf of Mila Merry, MD,as directed by  Mila Merry, MD      Established patient visit   Patient: Taylor [name]   DOB: 02/04/1941   82 y.o. Male  MRN: [id number] Visit Date: 03/05/2023  Today's healthcare provider: Mila Merry, MD     Subjective    HPI  Follow up Hospitalization  Patient was admitted to [facility] on 02/25/2023 and discharged on 02/27/2023. He was treated for  Discharge Diagnoses: Principal Problem:   Acute on chronic respiratory failure with hypoxia Active Problems:   Ascites   Chronic kidney disease (CKD), stage III (moderate)   Systolic CHF with reduced left ventricular function, NYHA class 3   Hyperbilirubinemia   CAD S/P percutaneous coronary angioplasty   Essential (primary) hypertension   Atrial fibrillation   Telephone follow up was done on 02/28/2023  Recommendations for Outpatient Follow-up:  Follow up with PCP Sherrie Mustache, Demetrios Isaacs, MD in 1 weeks Please obtain labs/tests: CBC, BMP in 1 week or sooner with nephrology  Please follow up on the following pending results: none PCP AND OTHER OUTPATIENT PROVIDERS: SEE BELOW FOR SPECIFIC DISCHARGE INSTRUCTIONS PRINTED FOR PATIENT IN ADDITION TO GENERIC AVS PATIENT INFO  Follow as directed w/ cardiology    Given kidney function, we are HOLDING your losartan and your allopurinol until your blood work can be rechecked outpatient. See medication list for details. OK to stop the antibiotics for the pacemaker. Your carvedilol dose has been reduced.    Increase activity slowly     Patient states he is still weak and has some exertional shortness of breath.  He is improving and has even been riding the stationary bicycle some.  His home blood pressure readings have been running 108-124 over 70's-80.  His O2 has been 94-100 at home.   -   Medications: Outpatient Medications Prior to Visit   Medication Sig   albuterol (VENTOLIN HFA) 108 (90 Base) MCG/ACT inhaler Inhale 2 puffs into the lungs every 6 (six) hours as needed for wheezing or shortness of breath.   aspirin 81 MG EC tablet Take 81 mg by mouth daily.    atorvastatin (LIPITOR) 40 MG tablet TAKE 1 TABLET(40 MG) BY MOUTH DAILY   benzonatate (TESSALON) 200 MG capsule Take by mouth.   carvedilol (COREG) 6.25 MG tablet Take 1 tablet (6.25 mg total) by mouth 2 (two) times daily with a meal.   empagliflozin (JARDIANCE) 10 MG TABS tablet Take by mouth daily.   ipratropium-albuterol (DUONEB) 0.5-2.5 (3) MG/3ML SOLN Inhale into the lungs.   isosorbide mononitrate (IMDUR) 30 MG 24 hr tablet TAKE 1/2 TABLET(15 MG) BY MOUTH DAILY   Magnesium 200 MG TABS Take 1 tablet (200 mg total) by mouth in the morning and at bedtime.   pantoprazole (PROTONIX) 40 MG tablet TAKE 1 TABLET BY MOUTH EVERY DAY   potassium chloride SA (KLOR-CON M) 20 MEQ tablet Take 20 mEq by mouth daily.   torsemide (DEMADEX) 20 MG tablet 60 mg (3 tablets) every morning and 40 mg (2 tablets) every afternoon/evening   triamcinolone ointment (KENALOG) 0.1 % APPLY TOPICALLY TWICE DAILY AS DIRECTED   apixaban (ELIQUIS) 2.5 MG TABS tablet Take 2 tablets (5 mg total) by mouth 2 (two) times daily.   No facility-administered medications prior to visit.    Review of Systems  Constitutional:  Positive for fatigue. Negative for fever.  Respiratory:  Positive for shortness  of breath (improving). Negative for cough and wheezing.   Cardiovascular:  Positive for leg swelling (slight). Negative for chest pain.  Neurological:  Positive for weakness.      Objective    BP 107/67 (BP Location: Left Arm, Patient Position: Sitting, Cuff Size: Normal)   Pulse 60   Temp (!) 97.5 F (36.4 C) (Oral)   Wt 173 lb (78.5 kg)   SpO2 100%   BMI 24.82 kg/m   Physical Exam   General: Appearance:    Well developed, well nourished male in no acute distress  Eyes:    PERRL,  conjunctiva/corneas clear, EOM's intact       Lungs:     Clear to auscultation bilaterally, respirations unlabored  Heart:    Normal heart rate. Irregularly irregular rhythm.  2/6 blowing, holosystolic murmur at apex   MS:   All extremities are intact.    Neurologic:   Awake, alert, oriented x 3. No apparent focal neurological defect.         Assessment & Plan     1. Systolic CHF with reduced left ventricular function, NYHA class 3 (HCC) Greatly improved since hospitalization with diuresis and paracentises.  - CBC - Comprehensive metabolic panel  2. Thrombocytopenia (HCC)  - CBC   3. Other ascites Concerning for concomitant liver disease.  - CBC - Comprehensive metabolic panel - Gamma GT - INR/PT - AFP tumor marker       The entirety of the information documented in the History of Present Illness, Review of Systems and Physical Exam were personally obtained by me. Portions of this information were initially documented by the CMA and reviewed by me for thoroughness and accuracy.     Mila Merry, MD  [facility] [phone] (phone) [phone] (fax)  [facility]

## 2023-03-05 ENCOUNTER — Ambulatory Visit (INDEPENDENT_AMBULATORY_CARE_PROVIDER_SITE_OTHER): Payer: Medicare Other | Admitting: Family Medicine

## 2023-03-05 VITALS — BP 107/67 | HR 60 | Temp 97.5°F | Wt 173.0 lb

## 2023-03-05 DIAGNOSIS — R188 Other ascites: Secondary | ICD-10-CM

## 2023-03-05 DIAGNOSIS — I5020 Unspecified systolic (congestive) heart failure: Secondary | ICD-10-CM

## 2023-03-05 DIAGNOSIS — D696 Thrombocytopenia, unspecified: Secondary | ICD-10-CM

## 2023-03-06 DIAGNOSIS — J9621 Acute and chronic respiratory failure with hypoxia: Secondary | ICD-10-CM | POA: Diagnosis not present

## 2023-03-06 LAB — AFP TUMOR MARKER: AFP, Serum, Tumor Marker: <1.8 ng/mL (ref 0.0–8.4)

## 2023-03-06 LAB — COMPREHENSIVE METABOLIC PANEL
ALT: 11 IU/L (ref 0–44)
AST: 27 IU/L (ref 0–40)
Albumin/Globulin Ratio: 0.9 — ABNORMAL LOW (ref 1.2–2.2)
Albumin: 3.5 g/dL — ABNORMAL LOW (ref 3.8–4.8)
Alkaline Phosphatase: 192 IU/L — ABNORMAL HIGH (ref 44–121)
BUN/Creatinine Ratio: 22 (ref 10–24)
BUN: 76 mg/dL (ref 8–27)
Bilirubin Total: 2.3 mg/dL — ABNORMAL HIGH (ref 0.0–1.2)
CO2: 27 mmol/L (ref 20–29)
Calcium: 9.3 mg/dL (ref 8.6–10.2)
Chloride: 92 mmol/L — ABNORMAL LOW (ref 96–106)
Creatinine, Ser: 3.38 mg/dL — ABNORMAL HIGH (ref 0.76–1.27)
Globulin, Total: 3.7 g/dL (ref 1.5–4.5)
Glucose: 127 mg/dL — ABNORMAL HIGH (ref 70–99)
Potassium: 3.7 mmol/L (ref 3.5–5.2)
Sodium: 137 mmol/L (ref 134–144)
Total Protein: 7.2 g/dL (ref 6.0–8.5)
eGFR: 18 mL/min/{1.73_m2} — ABNORMAL LOW (ref >59)

## 2023-03-06 LAB — CBC
Hematocrit: 39.2 % (ref 37.5–51.0)
Hemoglobin: 12.3 g/dL — ABNORMAL LOW (ref 13.0–17.7)
MCH: 31.1 pg (ref 26.6–33.0)
MCHC: 31.4 g/dL — ABNORMAL LOW (ref 31.5–35.7)
MCV: 99 fL — ABNORMAL HIGH (ref 79–97)
Platelets: 130 10*3/uL — ABNORMAL LOW (ref 150–450)
RBC: 3.96 x10E6/uL — ABNORMAL LOW (ref 4.14–5.80)
RDW: 16.6 % — ABNORMAL HIGH (ref 11.6–15.4)
WBC: 4.3 10*3/uL (ref 3.4–10.8)

## 2023-03-06 LAB — PROTIME-INR
INR: 1.5 — ABNORMAL HIGH (ref 0.9–1.2)
Prothrombin Time: 15.6 s — ABNORMAL HIGH (ref 9.1–12.0)

## 2023-03-06 LAB — GAMMA GT: GGT: 43 IU/L (ref 0–65)

## 2023-03-07 ENCOUNTER — Encounter: Payer: Medicare Other | Admitting: Family

## 2023-03-07 NOTE — Progress Notes (Unsigned)
Patient ID: Taylor [name], male    DOB: 01/25/1941, 82 y.o.   MRN: [id number]  Primary cardiologist: Marcina Millard, MD (last seen 04/24) PCP: Malva Limes, MD (last seen 04/24) AD[facility] provider: Edwena Blow, Adam, MD (last seen 03/24)  HPI  Taylor [name] is a 82 y/o male with a history of AAA, bladder cancer, DM, atrial fibrillation, CAD, hyperlipidemia, HTN, stroke, GERD, melanoma, pancreatitis, sleep apnea, VF and chronic heart failure.   Echo 10/30/22: EF of <20% along with mild LVH, severe LAE, severe Taylor, mild AR and moderate/severe TR.   Stress test 05/07/21: Moderate to severe LV systolic dysfunction  Large perfusion abnormality of severe intensity in the anterior apical  myocardial perfusion distribution consistent with previous infarct and/or  scar without evidence of myocardial ischemia   RHC/LHC 10/02/12: Left Main:  normal       LAD system:  significant       LCX system:  significant       RCA system:  normal       Number of vessels with significant CAD:  2 - Vessel   Left Ventriculogram       Ejection Fraction:   65%   Admitted 02/25/23 due to worsening SOB due to acute/ chronic HF. Placed on oxygen. Chest x-ray with enlargement of cardiac silhouette and mild pulmonary edema. CT of the chest abdomen pelvis with moderate to severe ascites with anasarca. Paracentesis yielded 6L fluids. Jardiance held due to AKI. Admitted 10/30/22 due to DOE and orthopnea x 7 days. Chest xray showed cardiomegaly and central vascular congestion. Cardiology & nephrology consults obtained. Given IV lasix. Cr worsened with diuretics so discontinued and put on gentle fluids in setting of HF. Placed on oxygen. Discharged after 3 days. Was in the ED 10/27/22 due to nausea epigastric discomfort some shortness of breath;   Taylor [name] presents today for a HF follow-up visit with a chief complaint of minimal SOB with moderate exertion. Chronic in nature. Has fatigue, weight loss and easy bruising along with this. Denies  difficulty sleeping, dizziness, abdominal distention, palpitations, pedal edema, chest pain, wheezing, cough or weight gain.   Had PPM battery changed out since last here.   Notes that [place] BP [place] to 91/70 and stayed that low for a couple of days. During this time, Taylor [name] was quite dizzy and "felt drunk". Has been eating/ drinking fluids like normal. After a couple of days of this, his BP has improved and dizziness has resolved.   Past Medical History:  Diagnosis Date   AAA (abdominal aortic aneurysm) (HCC) 06/03/2007   [facility]; Dr. Hart Rochester   AICD (automatic cardioverter/defibrillator) present    Arrhythmia    atrial fibrillation   Barrett's esophagus    Bladder cancer (HCC)    Bradycardia    CAD (coronary artery disease)    CAP (community acquired pneumonia) 11/13/2019   CHF (congestive heart failure) (HCC)    Cluster headache    COVID-19 09/27/2021   DDD (degenerative disc disease), lumbar    Diabetes mellitus without complication (HCC)    Dyspnea    WITH EXERTION   Edema    LEFT ANKLE   Fracture of skull base (HCC) 1997   due to fall   GERD (gastroesophageal reflux disease)    Gout    History of bladder cancer 12/1995   Hyperlipidemia    Hypertension    Hypocalcemia 04/19/2020   Possibly secondary to diuretics.  Low=5.9 04/19/2020   Malignant melanoma (HCC) 12/2012   right dorsal forearm excised   Myocardial infarction [facility])    LAST 2014   Osteoarthritis of knee    Other specified complication of vascular prosthetic devices, implants and grafts, initial encounter (HCC) 08/22/2021   Pacemaker 10/10/2006   Pancreatitis 11/22/2019   Pneumonia    2016   Pre-diabetes    Psoriasis    Rib fracture 1997   due to fall   Sleep apnea    CPAP   Stroke [facility])    Venous incompetence    Past Surgical History:  Procedure Laterality Date   ABDOMINAL AORTIC ANEURYSM REPAIR  06/03/2007   [facility]; Dr. Hart Rochester   ANGIOPLASTY  1994   [place]   BLADDER TUMOR EXCISION  12/1995   CAROTID PTA/STENT INTERVENTION Left 07/23/2022   Procedure: CAROTID PTA/STENT INTERVENTION;  Surgeon: Renford Dills, MD;  Location: [facility] INVASIVE CV LAB;  Service: Cardiovascular;  Laterality: Left;   CATARACT EXTRACTION W/PHACO Left 10/22/2016   Procedure: CATARACT EXTRACTION PHACO AND INTRAOCULAR LENS PLACEMENT (IOC);  Surgeon: Galen Manila, MD;  Location: [facility] ORS;  Service: Ophthalmology;  Laterality: Left;  [place] 47.7 AP% 18.4 CDE 8.78 Fluid pack lot # [id number]   CATARACT EXTRACTION W/PHACO Right 12/10/2016   Procedure: CATARACT EXTRACTION PHACO AND INTRAOCULAR LENS PLACEMENT (IOC);  Surgeon: Galen Manila, MD;  Location: [facility] ORS;  Service: Ophthalmology;  Laterality: Right;  [place] 00:39 AP% 23.3 CDE 9.13 Fluid pack lot # [id number]   CORONARY ANGIOPLASTY     STENTS X 5   CORONARY ARTERY BYPASS GRAFT  09/22/2006   four   ELBOW BURSA SURGERY     DUE TO GOUT   INSERT / REPLACE / REMOVE PACEMAKER     MELANOMA EXCISION  12/2012   Right forearm   PPM GENERATOR CHANGEOUT N/A 02/13/2023   Procedure: PPM GENERATOR CHANGEOUT;  Surgeon: Marcina Millard, MD;  Location: [facility] INVASIVE CV LAB;  Service: Cardiovascular;  Laterality: N/A;   Family History  Problem Relation Age of Onset   Cancer Mother        Melanoma skin cancer   Heart attack Father 38   Cancer Father        throat cancer   Arthritis Brother    Social History   Tobacco Use   Smoking status: Former    Packs/day: 1    Types: Cigarettes    Quit date: 11/26/1999    Years since quitting: 23.2   Smokeless tobacco: Never  Substance Use Topics   Alcohol use: No   Allergies  Allergen Reactions   Amlodipine Besylate Swelling    Had a reaction when taking with colcrys    Crestor [Rosuvastatin]     Muscle cramps and pain   Rocephin [Ceftriaxone]     unknown   Prior to Admission medications   Medication Sig Start Date End Date  Taking? Authorizing Provider  albuterol (VENTOLIN HFA) 108 (90 Base) MCG/ACT inhaler Inhale 2 puffs into the lungs every 6 (six) hours as needed for wheezing or shortness of breath. 01/03/23  Yes Malva Limes, MD  apixaban (ELIQUIS) 2.5 MG TABS tablet Take 2 tablets (5 mg total) by mouth 2 (two) times daily. 11/02/22  Yes Leeroy Bock, MD  aspirin 81 MG EC tablet Take 81 mg by mouth daily.    Yes [provider]  carvedilol (COREG) 6.25 MG tablet Take 1 tablet (6.25 mg total) by mouth 2 (two) times daily with  a meal. 02/27/23  Yes Sunnie Nielsen, DO  empagliflozin (JARDIANCE) 10 MG TABS tablet Take by mouth daily.   Yes [provider]  isosorbide mononitrate (IMDUR) 30 MG 24 hr tablet TAKE 1/2 TABLET(15 MG) BY MOUTH DAILY 11/28/22  Yes Malva Limes, MD  Magnesium 200 MG TABS Take 1 tablet (200 mg total) by mouth in the morning and at bedtime. 11/11/22  Yes Clarisa Kindred A, FNP  pantoprazole (PROTONIX) 40 MG tablet TAKE 1 TABLET BY MOUTH EVERY DAY 11/21/22  Yes Malva Limes, MD  potassium chloride SA (KLOR-CON M) 20 MEQ tablet Take 20 mEq by mouth daily.   Yes [provider]  torsemide (DEMADEX) 20 MG tablet 60 mg (3 tablets) every morning and 40 mg (2 tablets) every afternoon/evening 02/27/23  Yes Sunnie Nielsen, DO  triamcinolone ointment (KENALOG) 0.1 % APPLY TOPICALLY TWICE DAILY AS DIRECTED 07/15/22  Yes Malva Limes, MD  atorvastatin (LIPITOR) 40 MG tablet TAKE 1 TABLET(40 MG) BY MOUTH DAILY 12/30/22   Malva Limes, MD  ipratropium-albuterol (DUONEB) 0.5-2.5 (3) MG/3ML SOLN Inhale into the lungs. Patient not taking: Reported on 03/10/2023 01/27/23 04/27/23  [provider]   Review of Systems  Constitutional:  Positive for fatigue (with exertion). Negative for appetite change.  HENT:  Positive for congestion and hearing loss. Negative for rhinorrhea and sore throat.   Eyes: Negative.   Respiratory:  Positive for shortness of breath  (very littlle). Negative for cough, chest tightness and wheezing.   Cardiovascular:  Negative for chest pain, palpitations and leg swelling.  Gastrointestinal:  Negative for abdominal distention and abdominal pain.  Endocrine: Negative.   Genitourinary: Negative.   Musculoskeletal:  Negative for back pain and neck pain.  Skin: Negative.   Allergic/Immunologic: Negative.   Neurological:  Negative for dizziness and light-headedness.  Hematological:  Negative for adenopathy. Bruises/bleeds easily.  Psychiatric/Behavioral:  Negative for dysphoric mood and sleep disturbance (2L with CPAP). The patient is not nervous/anxious.    Vitals:   03/10/23 0858  BP: 113/84  Pulse: 67  Resp: 14  SpO2: 98%  Weight: 174 lb 4 oz (79 kg)   Wt Readings from Last 3 Encounters:  03/10/23 174 lb 4 oz (79 kg)  03/05/23 173 lb (78.5 kg)  02/27/23 171 lb 15.3 oz (78 kg)   Lab Results  Component Value Date   CREATININE 3.38 (H) 03/05/2023   CREATININE 3.02 (H) 02/27/2023   CREATININE 3.05 (H) 02/26/2023    Physical Exam Vitals and nursing note reviewed.  Constitutional:      Appearance: Normal appearance.  HENT:     Head: Normocephalic and atraumatic.     Right Ear: Decreased hearing noted.     Left Ear: Decreased hearing noted.  Cardiovascular:     Rate and Rhythm: Normal rate and regular rhythm.  Pulmonary:     Effort: Pulmonary effort is normal.     Breath sounds: No wheezing, rhonchi or rales.  Abdominal:     General: There is no distension.     Palpations: Abdomen is soft.     Tenderness: There is no abdominal tenderness.  Musculoskeletal:        General: No tenderness.     Cervical back: Normal range of motion and neck supple.     Right lower leg: No tenderness. Edema (trace pitting) present.     Left lower leg: No tenderness. Edema (trace pitting) present.  Skin:    General: Skin is warm and dry.  Neurological:  General: No focal deficit present.     Mental Status: Taylor [name] is alert  and oriented to person, place, and time.  Psychiatric:        Mood and Affect: Mood normal.        Behavior: Behavior normal.   Assessment & Plan:  1: Ischemic heart failure with reduced ejection fraction- - NYHA class II - euvolemic - weighing daily; weight chart reviewed and shows gradual weight loss; reminded to call for an overnight weight gain of > 2 pounds or a weekly weight gain of > 5 pounds - weight down 27 pounds from last visit here 6 weeks ago - echo 10/30/22 showed an EF of <20% along with mild LVH, severe LAE, severe Taylor, mild AR and moderate/severe TR - Stress test 05/07/21 showed moderate to severe LV systolic dysfunction, large perfusion abnormality of severe intensity in the anterior apical myocardial perfusion distribution consistent with previous infarct and/or scar without evidence of myocardial ischemia  - not adding salt to his food - continue carvedilol 12.5mg  BID - continue torsemide but will decrease this to  BID; can add the extra  back in if Taylor [name] has above weight gain or edema/ SOB  - continue jardiance  daily - renal function does not currently allow for entresto - saw [facility] ([place]) 03/24 - has AICD - Mg 12/24/22 was 1.8 - BNP 02/25/23 was 1076.2  2: HTN- - BP 113/84 - low BP's over the weekend but unsure why, decreasing torsemide per above - encouraged to wear compression socks daily with removal at bedtime - saw PCP Sherrie Mustache) 04/24 - BMP 03/05/23 reviewed and showed sodium 137, potassium 3.7, creatinine 3.38 and GFR 18 - saw nephrology Suezanne Jacquet) 04/24  3: Atrial fibrillation- - saw cardiology (Paraschos) 04/24 - continue apixaban  BID - has pacemaker with OptiVol capacity for fluid monitoring - battery change out done 02/13/23  4: CAD- - CABG 2007 - RHC/LHC done 10/02/12:      Left Main:  normal       LAD system:  significant       LCX system:  significant       RCA system:  normal       Left Ventriculogram Ejection Fraction:   65%   - atorvastatin  daily  5: DM with CKD-  - A1c 12/24/22 was 6.5%  6: Sleep apnea- - wearing CPAP nightly - saw pulmonology Karna Christmas) 03/24 - wearing oxygen at 2L with his CPAP   Due to HF stability, will not make a return appointment at this time. Advised to follow closely with cardiology but Taylor [name] could call for any questions or to make another appointment and Taylor [name] was comfortable with this plan.

## 2023-03-09 ENCOUNTER — Other Ambulatory Visit: Payer: Self-pay | Admitting: Family Medicine

## 2023-03-10 ENCOUNTER — Encounter: Payer: Medicare Other | Admitting: Family

## 2023-03-10 ENCOUNTER — Encounter: Payer: Self-pay | Admitting: Family

## 2023-03-10 ENCOUNTER — Ambulatory Visit: Payer: Medicare Other | Attending: Family | Admitting: Family

## 2023-03-10 VITALS — BP 113/84 | HR 67 | Resp 14 | Wt 174.2 lb

## 2023-03-10 DIAGNOSIS — I480 Paroxysmal atrial fibrillation: Secondary | ICD-10-CM | POA: Diagnosis not present

## 2023-03-10 DIAGNOSIS — I5022 Chronic systolic (congestive) heart failure: Secondary | ICD-10-CM

## 2023-03-10 DIAGNOSIS — I130 Hypertensive heart and chronic kidney disease with heart failure and stage 1 through stage 4 chronic kidney disease, or unspecified chronic kidney disease: Secondary | ICD-10-CM | POA: Diagnosis not present

## 2023-03-10 DIAGNOSIS — N189 Chronic kidney disease, unspecified: Secondary | ICD-10-CM | POA: Insufficient documentation

## 2023-03-10 DIAGNOSIS — Z95810 Presence of automatic (implantable) cardiac defibrillator: Secondary | ICD-10-CM | POA: Insufficient documentation

## 2023-03-10 DIAGNOSIS — Z79899 Other long term (current) drug therapy: Secondary | ICD-10-CM | POA: Diagnosis not present

## 2023-03-10 DIAGNOSIS — I2510 Atherosclerotic heart disease of native coronary artery without angina pectoris: Secondary | ICD-10-CM | POA: Diagnosis not present

## 2023-03-10 DIAGNOSIS — I509 Heart failure, unspecified: Secondary | ICD-10-CM | POA: Diagnosis not present

## 2023-03-10 DIAGNOSIS — Z9861 Coronary angioplasty status: Secondary | ICD-10-CM | POA: Diagnosis not present

## 2023-03-10 DIAGNOSIS — G4733 Obstructive sleep apnea (adult) (pediatric): Secondary | ICD-10-CM

## 2023-03-10 DIAGNOSIS — E1122 Type 2 diabetes mellitus with diabetic chronic kidney disease: Secondary | ICD-10-CM

## 2023-03-10 DIAGNOSIS — I10 Essential (primary) hypertension: Secondary | ICD-10-CM

## 2023-03-10 DIAGNOSIS — N184 Chronic kidney disease, stage 4 (severe): Secondary | ICD-10-CM | POA: Diagnosis not present

## 2023-03-10 DIAGNOSIS — Z7901 Long term (current) use of anticoagulants: Secondary | ICD-10-CM | POA: Insufficient documentation

## 2023-03-10 DIAGNOSIS — J811 Chronic pulmonary edema: Secondary | ICD-10-CM | POA: Diagnosis not present

## 2023-03-10 DIAGNOSIS — Z951 Presence of aortocoronary bypass graft: Secondary | ICD-10-CM | POA: Insufficient documentation

## 2023-03-10 DIAGNOSIS — G4730 Sleep apnea, unspecified: Secondary | ICD-10-CM | POA: Insufficient documentation

## 2023-03-10 DIAGNOSIS — I959 Hypotension, unspecified: Secondary | ICD-10-CM | POA: Diagnosis not present

## 2023-03-10 NOTE — Patient Instructions (Addendum)
Decrease your torsemide to 2 tablets in the morning and 2 tablets in the afternoon.    Call us in the future if you need [place] for anything.    If you receive a satisfaction survey regarding the [facility], please take the time to fill it out. This way we can continue to provide excellent care and make any changes that need to be made.

## 2023-03-11 NOTE — Telephone Encounter (Signed)
Requested Prescriptions  Pending Prescriptions Disp Refills   losartan (COZAAR) 50 MG tablet [Pharmacy Med Name: LOSARTAN 50MG  TABLETS] 90 tablet     Sig: TAKE 1 TABLET BY MOUTH DAILY     Cardiovascular:  Angiotensin Receptor Blockers Failed - 03/09/2023  4:24 PM      Failed - Cr in normal range and within 180 days    Creatinine, Ser  Date Value Ref Range Status  03/05/2023 3.38 (H) 0.76 - 1.27 mg/dL Final   Creatinine, Urine  Date Value Ref Range Status  08/10/2020 121 mg/dL Final    Comment:    Performed at [facility], [address], [place], [place] [address]         Passed - K in normal range and within 180 days    Potassium  Date Value Ref Range Status  03/05/2023 3.7 3.5 - 5.2 mmol/L Final         Passed - Patient is not pregnant      Passed - Last BP in normal range    BP Readings from Last 1 Encounters:  03/10/23 113/84         Passed - Valid encounter within last 6 months    Recent Outpatient Visits           6 days ago Thrombocytopenia   [facility] Malva Limes, MD   2 weeks ago Cellulitis of toe of left foot   [facility] Malva Limes, MD   2 months ago Chronic obstructive pulmonary disease, unspecified COPD type (HCC)   [facility] Malva Limes, MD   2 months ago DOE (dyspnea on exertion)   [facility] Malva Limes, MD   4 months ago Upper respiratory tract infection, unspecified type   [facility] Malva Limes, MD               losartan (COZAAR) 50 MG tablet [Pharmacy Med Name: LOSARTAN 50MG  TABLETS] 90 tablet 4    Sig: TAKE 1 TABLET(50 MG) BY MOUTH DAILY     Cardiovascular:  Angiotensin Receptor Blockers Failed - 03/09/2023  4:24 PM      Failed - Cr in normal range and within 180 days    Creatinine, Ser  Date Value Ref Range Status  03/05/2023 3.38 (H) 0.76 - 1.27 mg/dL Final    Creatinine, Urine  Date Value Ref Range Status  08/10/2020 121 mg/dL Final    Comment:    Performed at [facility], [address] [place], [place] [address]         Passed - K in normal range and within 180 days    Potassium  Date Value Ref Range Status  03/05/2023 3.7 3.5 - 5.2 mmol/L Final         Passed - Patient is not pregnant      Passed - Last BP in normal range    BP Readings from Last 1 Encounters:  03/10/23 113/84         Passed - Valid encounter within last 6 months    Recent Outpatient Visits           6 days ago Thrombocytopenia   [facility] Malva Limes, MD   2 weeks ago Cellulitis of toe of left foot   [facility] Malva Limes, MD   2 months ago Chronic obstructive  pulmonary disease, unspecified COPD type [facility])   [facility] Malva Limes, MD   2 months ago DOE (dyspnea on exertion)   [facility] Malva Limes, MD   4 months ago Upper respiratory tract infection, unspecified type   [facility] Malva Limes, MD

## 2023-03-19 ENCOUNTER — Ambulatory Visit: Payer: Self-pay | Admitting: *Deleted

## 2023-03-19 NOTE — Patient Outreach (Signed)
  Care Coordination   Follow Up Visit Note   03/19/2023 Name: Taylor [name] MRN: [id number] DOB: Jan 05, 1941  Taylor [name] is a 82 y.o. year old male who sees [name], Demetrios Isaacs, MD for primary care. I spoke with  Taylor [name] by phone today.  What matters to the patients health and wellness today?  Hospitalized 4/2-4/4 for CHF, state he is better now. Denies shortness of breath.     Goals Addressed             This Visit's Progress    Develop Plan of care for Management of CHF   On track    Care Coordination Interventions: Basic overview and discussion of pathophysiology of Heart Failure reviewed Provided education on low sodium diet Reviewed Heart Failure Action Plan in depth and provided written copy Assessed need for readable accurate scales in home Advised patient to weigh each morning after emptying bladder Discussed importance of daily weight and advised patient to weigh and record daily Reviewed role of diuretics in prevention of fluid overload and management of heart failure; Discussed deep breathing with use of oxygen.  State he has been able to reduce oxygen use and saturations have remained greater then 94% Follow up appointments:  HF clinic on 6/4.  PCP is not until June for AWV        SDOH assessments and interventions completed:  No     Care Coordination Interventions:  Yes, provided   Interventions Today    Flowsheet Row Most Recent Value  Chronic Disease   Chronic disease during today's visit Congestive Heart Failure (CHF)  General Interventions   General Interventions Discussed/Reviewed General Interventions Reviewed, Doctor Visits  Doctor Visits Discussed/Reviewed Doctor Visits Reviewed, Specialist  [cardiology on 4/29, nephrology on 4/30, and HF on 6/4]  PCP/Specialist Visits Compliance with follow-up visit  Exercise Interventions   Exercise Discussed/Reviewed Weight Managment  Weight Management Weight maintenance  [weight slightly increased to 178  pounds, denies swelling or shortness of breath]  Education Interventions   Education Provided Provided Education  Provided Verbal Education On When to see the doctor, Medication, Nutrition  [Disussed current dose of Torsemide, taking 2 tabs twice a day, will call provider if he feels he need to increase]  Nutrition Interventions   Nutrition Discussed/Reviewed Decreasing salt, Nutrition Reviewed        Follow up plan: Follow up call scheduled for 5/14    Encounter Outcome:  Pt. Visit Completed   Kemper Durie, RN, MSN, [facility] Care Management Care Management Coordinator [phone]

## 2023-03-24 DIAGNOSIS — I5022 Chronic systolic (congestive) heart failure: Secondary | ICD-10-CM | POA: Diagnosis not present

## 2023-03-24 DIAGNOSIS — I480 Paroxysmal atrial fibrillation: Secondary | ICD-10-CM | POA: Diagnosis not present

## 2023-03-24 DIAGNOSIS — I5023 Acute on chronic systolic (congestive) heart failure: Secondary | ICD-10-CM | POA: Diagnosis not present

## 2023-03-24 DIAGNOSIS — I255 Ischemic cardiomyopathy: Secondary | ICD-10-CM | POA: Diagnosis not present

## 2023-03-24 DIAGNOSIS — I10 Essential (primary) hypertension: Secondary | ICD-10-CM | POA: Diagnosis not present

## 2023-03-24 DIAGNOSIS — R001 Bradycardia, unspecified: Secondary | ICD-10-CM | POA: Diagnosis not present

## 2023-03-24 DIAGNOSIS — I872 Venous insufficiency (chronic) (peripheral): Secondary | ICD-10-CM | POA: Diagnosis not present

## 2023-03-24 DIAGNOSIS — I25810 Atherosclerosis of coronary artery bypass graft(s) without angina pectoris: Secondary | ICD-10-CM | POA: Diagnosis not present

## 2023-03-24 DIAGNOSIS — I495 Sick sinus syndrome: Secondary | ICD-10-CM | POA: Diagnosis not present

## 2023-03-24 DIAGNOSIS — Z95810 Presence of automatic (implantable) cardiac defibrillator: Secondary | ICD-10-CM | POA: Diagnosis not present

## 2023-03-24 DIAGNOSIS — I7143 Infrarenal abdominal aortic aneurysm, without rupture: Secondary | ICD-10-CM | POA: Diagnosis not present

## 2023-03-24 DIAGNOSIS — I4901 Ventricular fibrillation: Secondary | ICD-10-CM | POA: Diagnosis not present

## 2023-04-01 DIAGNOSIS — J4489 Other specified chronic obstructive pulmonary disease: Secondary | ICD-10-CM | POA: Diagnosis not present

## 2023-04-03 ENCOUNTER — Encounter: Payer: Self-pay | Admitting: Intensive Care

## 2023-04-03 ENCOUNTER — Encounter: Payer: Medicare Other | Attending: Cardiology

## 2023-04-03 ENCOUNTER — Other Ambulatory Visit: Payer: Self-pay

## 2023-04-03 ENCOUNTER — Inpatient Hospital Stay: Payer: Medicare Other

## 2023-04-03 ENCOUNTER — Inpatient Hospital Stay
Admission: EM | Admit: 2023-04-03 | Discharge: 2023-04-06 | DRG: 291 | Disposition: A | Discharge status: Home Health | Payer: Medicare Other | Attending: Family Medicine | Admitting: Family Medicine

## 2023-04-03 ENCOUNTER — Emergency Department: Payer: Medicare Other

## 2023-04-03 DIAGNOSIS — Z8679 Personal history of other diseases of the circulatory system: Secondary | ICD-10-CM

## 2023-04-03 DIAGNOSIS — K7460 Unspecified cirrhosis of liver: Secondary | ICD-10-CM | POA: Diagnosis not present

## 2023-04-03 DIAGNOSIS — Z955 Presence of coronary angioplasty implant and graft: Secondary | ICD-10-CM

## 2023-04-03 DIAGNOSIS — E785 Hyperlipidemia, unspecified: Secondary | ICD-10-CM | POA: Diagnosis present

## 2023-04-03 DIAGNOSIS — I5022 Chronic systolic (congestive) heart failure: Secondary | ICD-10-CM | POA: Diagnosis not present

## 2023-04-03 DIAGNOSIS — L409 Psoriasis, unspecified: Secondary | ICD-10-CM | POA: Diagnosis present

## 2023-04-03 DIAGNOSIS — I10 Essential (primary) hypertension: Secondary | ICD-10-CM | POA: Diagnosis present

## 2023-04-03 DIAGNOSIS — R188 Other ascites: Secondary | ICD-10-CM | POA: Diagnosis present

## 2023-04-03 DIAGNOSIS — I480 Paroxysmal atrial fibrillation: Secondary | ICD-10-CM | POA: Diagnosis present

## 2023-04-03 DIAGNOSIS — Z8674 Personal history of sudden cardiac arrest: Secondary | ICD-10-CM

## 2023-04-03 DIAGNOSIS — J9601 Acute respiratory failure with hypoxia: Secondary | ICD-10-CM | POA: Diagnosis present

## 2023-04-03 DIAGNOSIS — J9621 Acute and chronic respiratory failure with hypoxia: Secondary | ICD-10-CM | POA: Diagnosis present

## 2023-04-03 DIAGNOSIS — I2489 Other forms of acute ischemic heart disease: Secondary | ICD-10-CM | POA: Diagnosis not present

## 2023-04-03 DIAGNOSIS — I25119 Atherosclerotic heart disease of native coronary artery with unspecified angina pectoris: Secondary | ICD-10-CM | POA: Diagnosis not present

## 2023-04-03 DIAGNOSIS — I130 Hypertensive heart and chronic kidney disease with heart failure and stage 1 through stage 4 chronic kidney disease, or unspecified chronic kidney disease: Secondary | ICD-10-CM | POA: Diagnosis not present

## 2023-04-03 DIAGNOSIS — J00 Acute nasopharyngitis [common cold]: Secondary | ICD-10-CM | POA: Diagnosis not present

## 2023-04-03 DIAGNOSIS — Z7901 Long term (current) use of anticoagulants: Secondary | ICD-10-CM

## 2023-04-03 DIAGNOSIS — Z95810 Presence of automatic (implantable) cardiac defibrillator: Secondary | ICD-10-CM | POA: Diagnosis not present

## 2023-04-03 DIAGNOSIS — I509 Heart failure, unspecified: Secondary | ICD-10-CM | POA: Diagnosis not present

## 2023-04-03 DIAGNOSIS — K219 Gastro-esophageal reflux disease without esophagitis: Secondary | ICD-10-CM | POA: Diagnosis present

## 2023-04-03 DIAGNOSIS — Z79899 Other long term (current) drug therapy: Secondary | ICD-10-CM | POA: Diagnosis not present

## 2023-04-03 DIAGNOSIS — Z87891 Personal history of nicotine dependence: Secondary | ICD-10-CM

## 2023-04-03 DIAGNOSIS — J984 Other disorders of lung: Secondary | ICD-10-CM | POA: Diagnosis present

## 2023-04-03 DIAGNOSIS — K2270 Barrett's esophagus without dysplasia: Secondary | ICD-10-CM | POA: Diagnosis not present

## 2023-04-03 DIAGNOSIS — N2889 Other specified disorders of kidney and ureter: Secondary | ICD-10-CM | POA: Diagnosis not present

## 2023-04-03 DIAGNOSIS — I5023 Acute on chronic systolic (congestive) heart failure: Secondary | ICD-10-CM | POA: Diagnosis not present

## 2023-04-03 DIAGNOSIS — Z888 Allergy status to other drugs, medicaments and biological substances status: Secondary | ICD-10-CM

## 2023-04-03 DIAGNOSIS — E1122 Type 2 diabetes mellitus with diabetic chronic kidney disease: Secondary | ICD-10-CM | POA: Diagnosis not present

## 2023-04-03 DIAGNOSIS — Z8673 Personal history of transient ischemic attack (TIA), and cerebral infarction without residual deficits: Secondary | ICD-10-CM

## 2023-04-03 DIAGNOSIS — Z7982 Long term (current) use of aspirin: Secondary | ICD-10-CM | POA: Diagnosis not present

## 2023-04-03 DIAGNOSIS — Z8701 Personal history of pneumonia (recurrent): Secondary | ICD-10-CM

## 2023-04-03 DIAGNOSIS — R109 Unspecified abdominal pain: Secondary | ICD-10-CM | POA: Diagnosis not present

## 2023-04-03 DIAGNOSIS — J90 Pleural effusion, not elsewhere classified: Secondary | ICD-10-CM | POA: Diagnosis not present

## 2023-04-03 DIAGNOSIS — Z808 Family history of malignant neoplasm of other organs or systems: Secondary | ICD-10-CM

## 2023-04-03 DIAGNOSIS — Z85820 Personal history of malignant melanoma of skin: Secondary | ICD-10-CM

## 2023-04-03 DIAGNOSIS — Z8249 Family history of ischemic heart disease and other diseases of the circulatory system: Secondary | ICD-10-CM

## 2023-04-03 DIAGNOSIS — Z8616 Personal history of COVID-19: Secondary | ICD-10-CM | POA: Diagnosis not present

## 2023-04-03 DIAGNOSIS — Z951 Presence of aortocoronary bypass graft: Secondary | ICD-10-CM

## 2023-04-03 DIAGNOSIS — I252 Old myocardial infarction: Secondary | ICD-10-CM

## 2023-04-03 DIAGNOSIS — Z881 Allergy status to other antibiotic agents status: Secondary | ICD-10-CM

## 2023-04-03 DIAGNOSIS — Z7984 Long term (current) use of oral hypoglycemic drugs: Secondary | ICD-10-CM

## 2023-04-03 DIAGNOSIS — E876 Hypokalemia: Secondary | ICD-10-CM

## 2023-04-03 DIAGNOSIS — M1710 Unilateral primary osteoarthritis, unspecified knee: Secondary | ICD-10-CM | POA: Diagnosis not present

## 2023-04-03 DIAGNOSIS — Z8261 Family history of arthritis: Secondary | ICD-10-CM

## 2023-04-03 DIAGNOSIS — J449 Chronic obstructive pulmonary disease, unspecified: Secondary | ICD-10-CM | POA: Diagnosis present

## 2023-04-03 DIAGNOSIS — I25810 Atherosclerosis of coronary artery bypass graft(s) without angina pectoris: Secondary | ICD-10-CM | POA: Diagnosis not present

## 2023-04-03 DIAGNOSIS — N189 Chronic kidney disease, unspecified: Secondary | ICD-10-CM | POA: Diagnosis not present

## 2023-04-03 DIAGNOSIS — R0602 Shortness of breath: Secondary | ICD-10-CM | POA: Diagnosis not present

## 2023-04-03 DIAGNOSIS — I4891 Unspecified atrial fibrillation: Secondary | ICD-10-CM | POA: Diagnosis present

## 2023-04-03 DIAGNOSIS — N184 Chronic kidney disease, stage 4 (severe): Secondary | ICD-10-CM | POA: Diagnosis not present

## 2023-04-03 DIAGNOSIS — Z8551 Personal history of malignant neoplasm of bladder: Secondary | ICD-10-CM

## 2023-04-03 DIAGNOSIS — I340 Nonrheumatic mitral (valve) insufficiency: Secondary | ICD-10-CM | POA: Diagnosis not present

## 2023-04-03 DIAGNOSIS — G4733 Obstructive sleep apnea (adult) (pediatric): Secondary | ICD-10-CM | POA: Diagnosis present

## 2023-04-03 DIAGNOSIS — J811 Chronic pulmonary edema: Secondary | ICD-10-CM | POA: Diagnosis not present

## 2023-04-03 DIAGNOSIS — I5043 Acute on chronic combined systolic (congestive) and diastolic (congestive) heart failure: Secondary | ICD-10-CM | POA: Diagnosis present

## 2023-04-03 DIAGNOSIS — N179 Acute kidney failure, unspecified: Secondary | ICD-10-CM | POA: Diagnosis present

## 2023-04-03 DIAGNOSIS — I110 Hypertensive heart disease with heart failure: Secondary | ICD-10-CM | POA: Diagnosis not present

## 2023-04-03 LAB — CBC WITH DIFFERENTIAL/PLATELET
Abs Immature Granulocytes: 0.02 10*3/uL (ref 0.00–0.07)
Basophils Absolute: 0.1 10*3/uL (ref 0.0–0.1)
Basophils Relative: 1 %
Eosinophils Absolute: 0.1 10*3/uL (ref 0.0–0.5)
Eosinophils Relative: 2 %
HCT: 38.3 % — ABNORMAL LOW (ref 39.0–52.0)
Hemoglobin: 11.6 g/dL — ABNORMAL LOW (ref 13.0–17.0)
Immature Granulocytes: 1 %
Lymphocytes Relative: 26 %
Lymphs Abs: 1 10*3/uL (ref 0.7–4.0)
MCH: 30.4 pg (ref 26.0–34.0)
MCHC: 30.3 g/dL (ref 30.0–36.0)
MCV: 100.5 fL — ABNORMAL HIGH (ref 80.0–100.0)
Monocytes Absolute: 0.5 10*3/uL (ref 0.1–1.0)
Monocytes Relative: 13 %
Neutro Abs: 2.2 10*3/uL (ref 1.7–7.7)
Neutrophils Relative %: 57 %
Platelets: 106 10*3/uL — ABNORMAL LOW (ref 150–400)
RBC: 3.81 MIL/uL — ABNORMAL LOW (ref 4.22–5.81)
RDW: 18.9 % — ABNORMAL HIGH (ref 11.5–15.5)
WBC: 3.8 10*3/uL — ABNORMAL LOW (ref 4.0–10.5)
nRBC: 0 % (ref 0.0–0.2)

## 2023-04-03 LAB — COMPREHENSIVE METABOLIC PANEL
ALT: 11 U/L (ref 0–44)
AST: 32 U/L (ref 15–41)
Albumin: 2.9 g/dL — ABNORMAL LOW (ref 3.5–5.0)
Alkaline Phosphatase: 144 U/L — ABNORMAL HIGH (ref 38–126)
Anion gap: 11 (ref 5–15)
BUN: 80 mg/dL — ABNORMAL HIGH (ref 8–23)
CO2: 27 mmol/L (ref 22–32)
Calcium: 8.6 mg/dL — ABNORMAL LOW (ref 8.9–10.3)
Chloride: 97 mmol/L — ABNORMAL LOW (ref 98–111)
Creatinine, Ser: 3.77 mg/dL — ABNORMAL HIGH (ref 0.61–1.24)
GFR, Estimated: 15 mL/min — ABNORMAL LOW (ref >60)
Glucose, Bld: 108 mg/dL — ABNORMAL HIGH (ref 70–99)
Potassium: 2.8 mmol/L — ABNORMAL LOW (ref 3.5–5.1)
Sodium: 135 mmol/L (ref 135–145)
Total Bilirubin: 2.5 mg/dL — ABNORMAL HIGH (ref 0.3–1.2)
Total Protein: 7.2 g/dL (ref 6.5–8.1)

## 2023-04-03 LAB — MAGNESIUM: Magnesium: 2.5 mg/dL — ABNORMAL HIGH (ref 1.7–2.4)

## 2023-04-03 LAB — BRAIN NATRIURETIC PEPTIDE: B Natriuretic Peptide: 1650 pg/mL — ABNORMAL HIGH (ref 0.0–100.0)

## 2023-04-03 LAB — TROPONIN I (HIGH SENSITIVITY): Troponin I (High Sensitivity): 35 ng/L — ABNORMAL HIGH (ref <18)

## 2023-04-03 LAB — SARS CORONAVIRUS 2 BY RT PCR: SARS Coronavirus 2 by RT PCR: NEGATIVE

## 2023-04-03 MED ORDER — SODIUM CHLORIDE 0.9% FLUSH
3.0000 mL | Freq: Two times a day (BID) | INTRAVENOUS | Status: DC
  Administered 2023-04-04 – 2023-04-06 (×3): 3 mL via INTRAVENOUS

## 2023-04-03 MED ORDER — FUROSEMIDE 10 MG/ML IJ SOLN
60.0000 mg | Freq: Two times a day (BID) | INTRAMUSCULAR | Status: DC
  Administered 2023-04-03: 60 mg via INTRAVENOUS
  Filled 2023-04-03: qty 8

## 2023-04-03 MED ORDER — SODIUM CHLORIDE 0.9 % IV SOLN: 250.0000 mL | INTRAVENOUS | Status: DC | PRN

## 2023-04-03 MED ORDER — SODIUM CHLORIDE 0.9% FLUSH: 3.0000 mL | INTRAVENOUS | Status: DC | PRN

## 2023-04-03 MED ORDER — ACETAMINOPHEN 325 MG PO TABS: 650.0000 mg | ORAL_TABLET | ORAL | Status: DC | PRN

## 2023-04-03 MED ORDER — FENTANYL CITRATE PF 50 MCG/ML IJ SOSY
50.0000 ug | PREFILLED_SYRINGE | Freq: Once | INTRAMUSCULAR | Status: AC
  Administered 2023-04-03: 50 ug via INTRAVENOUS
  Filled 2023-04-03: qty 1

## 2023-04-03 MED ORDER — POTASSIUM CHLORIDE 20 MEQ PO PACK
40.0000 meq | PACK | Freq: Once | ORAL | Status: AC
  Administered 2023-04-03: 40 meq via ORAL
  Filled 2023-04-03: qty 2

## 2023-04-03 MED ORDER — ONDANSETRON HCL 4 MG/2ML IJ SOLN
4.0000 mg | Freq: Once | INTRAMUSCULAR | Status: AC
  Administered 2023-04-03: 4 mg via INTRAVENOUS
  Filled 2023-04-03: qty 2

## 2023-04-03 MED ORDER — FUROSEMIDE 10 MG/ML IJ SOLN: 40.0000 mg | Freq: Two times a day (BID) | INTRAMUSCULAR | Status: DC

## 2023-04-03 MED ORDER — POTASSIUM CHLORIDE 10 MEQ/100ML IV SOLN
10.0000 meq | INTRAVENOUS | Status: AC
  Administered 2023-04-03 (×2): 10 meq via INTRAVENOUS
  Filled 2023-04-03: qty 100

## 2023-04-03 MED ORDER — ONDANSETRON HCL 4 MG/2ML IJ SOLN: 4.0000 mg | Freq: Four times a day (QID) | INTRAMUSCULAR | Status: DC | PRN

## 2023-04-03 NOTE — ED Provider Notes (Signed)
[facility] Provider Note    Event Date/Time   First MD Initiated Contact with Patient 04/03/23 1052     (approximate)   History   Shortness of Breath   HPI  Taylor [name] is a 82 y.o. male heart failure, CABG, ICD, A-fib, diabetes, prior V-fib arrest, abdominal aortic aneurysm, CKD who comes in with concerns for shortness of breath.  On review of records patient had prior admissions for CHF dosed with Lasix and there was concern for new ascites and required paracentesis.  I reviewed the discharge note from 4/4 where they underwent paracentesis with 6 L out.  He was discharged thought to be from hepatic congestion, portal hypertension.  Patient reports that he has been feeling like he has had of virus.  He reports having some cold symptoms with cough but now he has been developing some increasing shortness of breath.  Initial sats were in the 80s he was placed on 4 to 5 L.  He reports having continuing abdominal distention.  He reports that after he had the paracentesis done when he was in the hospital he had significant relief in shortness of breath but unfortunately the fluid has since built back up he reports a 30 pound weight gain and feeling like his abdomen is more distended.  He denies any falls hitting his head or any other concerns.   Physical Exam   Triage Vital Signs: ED Triage Vitals  Enc Vitals Group     BP 04/03/23 1041 107/68     Pulse Rate 04/03/23 1044 (!) 57     Resp 04/03/23 1041 (!) 30     Temp --      Temp src --      SpO2 04/03/23 1040 (!) 80 %     Weight 04/03/23 1042 197 lb (89.4 kg)     Height 04/03/23 1042 5\' 10"  (1.778 m)     Head Circumference --      Peak Flow --      Pain Score 04/03/23 1042 8     Pain Loc --      Pain Edu? --      Excl. in GC? --     Most recent vital signs: Vitals:   04/03/23 1041 04/03/23 1044  BP: 107/68   Pulse:  (!) 57  Resp: (!) 30   SpO2: (!) 80% 100%     General: Awake, no distress.   CV:  Good peripheral perfusion.  Resp:  Mild increased work of breathing. Abd:  Positive distention no rebound, no guarding Other:  Trace edema noted in his legs.   ED Results / Procedures / Treatments   Labs (all labs ordered are listed, but only abnormal results are displayed) Labs Reviewed  CBC WITH DIFFERENTIAL/PLATELET  COMPREHENSIVE METABOLIC PANEL  TROPONIN I (HIGH SENSITIVITY)     EKG  My interpretation of EKG:  Ventricular paced rhythm with a rate of 59 without any ST elevation or T wave inversions look concerning him.  He is being paced so has widened QRS  RADIOLOGY I have reviewed the xray personally and interpreted and patient has evidence of pulmonary edema  PROCEDURES:  Critical Care performed: Yes, see critical care procedure note(s)  .1-3 Lead EKG Interpretation  Performed by: Concha Se, MD Authorized by: Concha Se, MD     Interpretation: abnormal     ECG rate:  50   ECG rate assessment: bradycardic     Rhythm: paced  Ectopy: none     Conduction: normal   .Critical Care  Performed by: Concha Se, MD Authorized by: Concha Se, MD   Critical care provider statement:    Critical care time (minutes):  30   Critical care was necessary to treat or prevent imminent or life-threatening deterioration of the following conditions:  Respiratory failure   Critical care was time spent personally by me on the following activities:  Development of treatment plan with patient or surrogate, discussions with consultants, evaluation of patient's response to treatment, examination of patient, ordering and review of laboratory studies, ordering and review of radiographic studies, ordering and performing treatments and interventions, pulse oximetry, re-evaluation of patient's condition and review of old charts    MEDICATIONS ORDERED IN ED: Medications  fentaNYL (SUBLIMAZE) injection 50 mcg (50 mcg Intravenous Given 04/03/23 1121)  ondansetron (ZOFRAN)  injection 4 mg (4 mg Intravenous Given 04/03/23 1121)     IMPRESSION / MDM / ASSESSMENT AND PLAN / ED COURSE  I reviewed the triage vital signs and the nursing notes.   Patient's presentation is most consistent with acute presentation with potential threat to life or bodily function.   Patient comes in with concerns for worsening shortness of breath had to be placed on 4 L of oxygen.  This is concerning for CHF.  Patient with reports some abdominal discomfort I suspect this is more likely related to his abdominal distention lower suspicion for SBP given he reports that he has had similar pain in the past.  However we will get a CT scan just to ensure there is nothing else more acute going on.  Will have IR come down and tapped patient.  Patient x-ray confirms fluid overload.  Patient will be need to be given some IV Lasix.  His CBC shows slightly low white count.  His troponin slightly elevated which has had some slight bumps previously.  His creatinine is slightly more than baseline with a low potassium of 2.8.  Will give some gentle repletion given pt will need diuresis.   I do not order the Lasix yet given patient's concern for his low potassium would want to replete this first.  He is got significant ascites noted on CT imaging.  He does have  new aortic stents noted but on repeat evaluations abdomen is soft nontender and low suspicion for this being related to an aneurysm rupture.  He states that this feels very similar to when he needed the fluid taken off previously.  I discussed with IR to try to get help facilitate getting this done today and placed the orders for it.  Patient looks much more comfortable on 4 L of oxygen I discussed the hospital team for admission.  The patient is on the cardiac monitor to evaluate for evidence of arrhythmia and/or significant heart rate changes.      FINAL CLINICAL IMPRESSION(S) / ED DIAGNOSES   Final diagnoses:  Acute respiratory failure with hypoxia  (HCC)  Hypokalemia  Acute on chronic congestive heart failure, unspecified heart failure type (HCC)  Other ascites     Rx / DC Orders   ED Discharge Orders     None        Note:  This document was prepared using Dragon voice recognition software and may include unintentional dictation errors.   Concha Se, MD 04/03/23 1336

## 2023-04-03 NOTE — Assessment & Plan Note (Signed)
-   Hold home antiglycemic agents - SSI, sensitive 

## 2023-04-03 NOTE — Progress Notes (Signed)
OT Cancellation Note  Patient Details Name: Taylor [name] MRN: [id number] DOB: 09/25/1941   Cancelled Treatment:    Reason Eval/Treat Not Completed: Other (comment). Consult received, chart reviewed. Pt pending paracentesis and cardiology consult. Will hold OT evaluation and re-attempt next date pending procedure and plan of care.   Arman Filter., MPH, MS, OTR/L ascom [phone] 04/03/23, 4:29 PM

## 2023-04-03 NOTE — Assessment & Plan Note (Signed)
-   Monitor blood pressure closely while diuresing - Continue home antihypertensives for now

## 2023-04-03 NOTE — Assessment & Plan Note (Addendum)
Secondary to discontinuation of torsemide.  Patient has a history of HFrEF with last EF of less than 20%.  BNP markedly elevated at 1600.  Recent hospitalization for similar presentation in April.  - Given severity of HFrEF, will consult cardiology; appreciate their recommendations - Start Lasix 60 mg IV twice daily - Continue GDMT - Daily weights - Strict in and out - Potassium replacement as ordered

## 2023-04-03 NOTE — ED Notes (Signed)
FIRST RN: pt arrived via W/C from [facility] with reports that pt was in cardiac rehab and his o2 sats dropped to 80% on RA. Pt was placed on 2L [place] before arriving to ED.  Pt has labored work of breathing with grunting noted, pt o2 increased to 4L [place].

## 2023-04-03 NOTE — ED Notes (Signed)
Pt presents to ED with c/o of ABD pain and SOB since Sunday. Pt states he was sent from [facility] clinic for a low O2 sat.   Pt states he has a HX of ascites and ABD pt's is very distended and firm to touch, pt is grunting due to pain in ABD, pt has a HX of paracentesis.   Pt currently on 4L/min and pt is maintaining a O2 sat of 96% on this. Pt is having tachypnea at this time and states it is due to pain.   Pt states recent "cold" but no fever or chills. Pt is A&Ox4 at this time. Pt does appear to be in distress but breathing exercises done with pt and pt seemed to be less distress.  HOB adjusted to help relive pressure off ABD, pt states "some relief" with this.

## 2023-04-03 NOTE — ED Notes (Signed)
MD made aware of low rectal temp result

## 2023-04-03 NOTE — H&P (Addendum)
History and Physical    Patient: Taylor [name] ZOX:[id number] DOB: 17-Nov-1941 DOA: 04/03/2023 DOS: the patient was seen and examined on 04/03/2023 PCP: Malva Limes, MD  Patient coming from: Home  Chief Complaint:  Chief Complaint  Patient presents with   Shortness of Breath   HPI: Taylor [name] is a 82 y.o. male with medical history significant of decompensated cirrhosis, HFrEF with last EF of less than 20% s/p AICD, CAD s/p DES, CKD stage IV, psoriasis, CVA, hypertension, restrictive lung disease, atrial fibrillation on Eliquis, type 2 diabetes, who presents to the ED due to shortness of breath.  Mr. Prinkey states that starting on 5/5, he began to experience worsening lower extremity edema, rapid abdominal distention, shortness of breath, and productive cough.  Since then, his symptoms have progressively worsened.  He found the only relieving factor to be hot showers to help him cough up his mucus.  He denies any chest pain, he notes that his abdominal pain radiates down into his right inguinal region as he has a hernia that popped out and he had to push it back in.  He denies any fever, chills, nausea, vomiting.  He denies any salty meals but states that his cardiac team told him to stop taking torsemide on 4/29.  So since then, he has not taken any.    ED course: On arrival to the ED, patient was normotensive at 107/68 with heart rate of 63.  He was saturating at 100% on 4 L of supplemental oxygen.  He was mildly hypothermic at 96.1.  Initial workup notable for WBC of 3.8, hemoglobin 11.6, MCV of 100.6, and platelets of 106, potassium 2.8, BUN 80, creatinine 3.77 with GFR 15.  Troponin elevated at 35 and BNP elevated at 1650. Chest x-ray was obtained that demonstrated cardiomegaly with mild to moderate pulmonary edema and bilateral pleural effusions.  CT of the abdomen was obtained that was similar to prior including moderate ascites with anasarca, nodular liver, severe atrophy of left kidney,  and stable right pleural effusion.  Patient started on IV potassium as hypokalemi.  TRH contacted for admission.  Review of Systems: As mentioned in the history of present illness. All other systems reviewed and are negative.  Past Medical History:  Diagnosis Date   AAA (abdominal aortic aneurysm) (HCC) 06/03/2007   [facility]; Dr. Hart Rochester   AICD (automatic cardioverter/defibrillator) present    Arrhythmia    atrial fibrillation   Barrett's esophagus    Bladder cancer (HCC)    Bradycardia    CAD (coronary artery disease)    CAP (community acquired pneumonia) 11/13/2019   CHF (congestive heart failure) (HCC)    Cluster headache    COVID-19 09/27/2021   DDD (degenerative disc disease), lumbar    Diabetes mellitus without complication (HCC)    Dyspnea    WITH EXERTION   Edema    LEFT ANKLE   Fracture of skull base (HCC) 1997   due to fall   GERD (gastroesophageal reflux disease)    Gout    History of bladder cancer 12/1995   Hyperlipidemia    Hypertension    Hypocalcemia 04/19/2020   Possibly secondary to diuretics.   Low=5.9 04/19/2020   Malignant melanoma (HCC) 12/2012   right dorsal forearm excised   Myocardial infarction [facility])    LAST 2014   Osteoarthritis of knee    Other specified complication of vascular prosthetic devices, implants and grafts, initial encounter (HCC) 08/22/2021   Pacemaker 10/10/2006  Pancreatitis 11/22/2019   Pneumonia    2016   Pre-diabetes    Psoriasis    Rib fracture 1997   due to fall   Sleep apnea    CPAP   Stroke [facility])    Venous incompetence    Past Surgical History:  Procedure Laterality Date   ABDOMINAL AORTIC ANEURYSM REPAIR  06/03/2007   [facility]; Dr. Hart [place]   ANGIOPLASTY  1994   [place]   BLADDER TUMOR EXCISION  12/1995   CAROTID PTA/STENT INTERVENTION Left 07/23/2022   Procedure: CAROTID PTA/STENT INTERVENTION;  Surgeon: Renford Dills, MD;  Location: [facility] INVASIVE CV LAB;  Service:  Cardiovascular;  Laterality: Left;   CATARACT EXTRACTION W/PHACO Left 10/22/2016   Procedure: CATARACT EXTRACTION PHACO AND INTRAOCULAR LENS PLACEMENT (IOC);  Surgeon: Galen Manila, MD;  Location: [facility] ORS;  Service: Ophthalmology;  Laterality: Left;  [place] 47.7 AP% 18.4 CDE 8.78 Fluid pack lot # [id number]   CATARACT EXTRACTION W/PHACO Right 12/10/2016   Procedure: CATARACT EXTRACTION PHACO AND INTRAOCULAR LENS PLACEMENT (IOC);  Surgeon: Galen Manila, MD;  Location: [facility] ORS;  Service: Ophthalmology;  Laterality: Right;  [place] 00:39 AP% 23.3 CDE 9.13 Fluid pack lot # [id number]   CORONARY ANGIOPLASTY     STENTS X 5   CORONARY ARTERY BYPASS GRAFT  09/22/2006   four   ELBOW BURSA SURGERY     DUE TO GOUT   INSERT / REPLACE / REMOVE PACEMAKER     MELANOMA EXCISION  12/2012   Right forearm   PPM GENERATOR CHANGEOUT N/A 02/13/2023   Procedure: PPM GENERATOR CHANGEOUT;  Surgeon: Marcina Millard, MD;  Location: [facility] INVASIVE CV LAB;  Service: Cardiovascular;  Laterality: N/A;   Social History:  reports that he quit smoking about 23 years ago. His smoking use included cigarettes. He has a 30.00 pack-year smoking history. He has never used smokeless tobacco. He reports that he does not drink alcohol and does not use drugs.  Allergies  Allergen Reactions   Amlodipine Besylate Swelling    Had a reaction when taking with colcrys    Crestor [Rosuvastatin]     Muscle cramps and pain   Rocephin [Ceftriaxone]     unknown    Family History  Problem Relation Age of Onset   Cancer Mother        Melanoma skin cancer   Heart attack Father 14   Cancer Father        throat cancer   Arthritis Brother     Prior to Admission medications   Medication Sig Start Date End Date Taking? Authorizing Provider  albuterol (VENTOLIN HFA) 108 (90 Base) MCG/ACT inhaler Inhale 2 puffs into the lungs every 6 (six) hours as needed for wheezing or shortness of breath. 01/03/23  Yes Malva Limes, MD  apixaban  (ELIQUIS) 2.5 MG TABS tablet Take 2 tablets (5 mg total) by mouth 2 (two) times daily. Patient taking differently: Take 5 mg by mouth daily. 11/02/22  Yes Leeroy Bock, MD  aspirin 81 MG EC tablet Take 81 mg by mouth daily.    Yes [provider]  atorvastatin (LIPITOR) 40 MG tablet TAKE 1 TABLET(40 MG) BY MOUTH DAILY Patient taking differently: Take 40 mg by mouth daily. 12/30/22  Yes Malva Limes, MD  carvedilol (COREG) 6.25 MG tablet Take 1 tablet (6.25 mg total) by mouth 2 (two) times daily with a meal. 02/27/23  Yes Sunnie Nielsen, DO  empagliflozin (JARDIANCE) 10 MG TABS tablet Take by mouth  daily.   Yes [provider]  ipratropium-albuterol (DUONEB) 0.5-2.5 (3) MG/3ML SOLN Inhale into the lungs. 01/27/23 04/27/23 Yes [provider]  isosorbide mononitrate (IMDUR) 30 MG 24 hr tablet TAKE 1/2 TABLET(15 MG) BY MOUTH DAILY Patient taking differently: Take 15 mg by mouth daily. 11/28/22  Yes Malva Limes, MD  pantoprazole (PROTONIX) 40 MG tablet TAKE 1 TABLET BY MOUTH EVERY DAY 11/21/22  Yes Malva Limes, MD  potassium chloride SA (KLOR-CON M) 20 MEQ tablet Take 20 mEq by mouth daily.   Yes [provider]  torsemide (DEMADEX) 20 MG tablet 60 mg (3 tablets) every morning and 40 mg (2 tablets) every afternoon/evening Patient taking differently: Take 40 mg by mouth 2 (two) times daily. 02/27/23  Yes Sunnie Nielsen, DO  clarithromycin (BIAXIN) 500 MG tablet Take 500 mg by mouth 2 (two) times daily. 02/14/23   [provider]  Magnesium 200 MG TABS Take 1 tablet (200 mg total) by mouth in the morning and at bedtime. 11/11/22   Delma Freeze, FNP  triamcinolone ointment (KENALOG) 0.1 % APPLY TOPICALLY TWICE DAILY AS DIRECTED Patient not taking: Reported on 04/03/2023 07/15/22   Malva Limes, MD    Physical Exam: Vitals:   04/03/23 1041 04/03/23 1042 04/03/23 1044 04/03/23 1200  BP: 107/68   101/89  Pulse:   (!) 57 64  Resp: (!) 30    17  Temp:    (!) 96.1 F (35.6 C)  TempSrc:    Rectal  SpO2: (!) 80%  100% 99%  Weight:  89.4 kg    Height:  5\' 10"  (1.778 m)     Physical Exam Vitals and nursing note reviewed.  Constitutional:      General: He is not in acute distress. HENT:     Head: Normocephalic and atraumatic.     Mouth/Throat:     Mouth: Mucous membranes are moist.     Pharynx: Oropharynx is clear.  Eyes:     General: Scleral icterus (Mild) present.     Extraocular Movements: Extraocular movements intact.     Pupils: Pupils are equal, round, and reactive to light.  Cardiovascular:     Rate and Rhythm: Normal rate and regular rhythm.     Comments: Difficult to auscultate murmurs over very loud breath sounds Pulmonary:     Effort: Tachypnea and accessory muscle usage present.     Breath sounds: Wheezing (Diffuse) and rhonchi (Diffuse) present.  Chest:     Chest wall: No tenderness.  Abdominal:     General: Bowel sounds are normal. There is distension (markedly).     Tenderness: There is generalized abdominal tenderness.     Hernia: A hernia is present. Hernia is present in the right inguinal area.  Musculoskeletal:     Right lower leg: 2+ Pitting Edema present.     Left lower leg: 2+ Pitting Edema present.  Skin:    General: Skin is warm and dry.     Comments: Spider angiomas on the abdomen  Neurological:     General: No focal deficit present.     Mental Status: He is alert and oriented to person, place, and time.  Psychiatric:        Mood and Affect: Mood normal.        Behavior: Behavior normal.    Data Reviewed: CBC with WBC 3.8, hemoglobin 11.6, MCV of 100.5 and platelets 106 CMP with sodium 135, potassium 2.8, bicarb 27, glucose 108, BUN 80, creatinine 3.77,  alkaline phosphatase 144, albumin 2.9, total bilirubin of 2.5 and GFR 15 BNP elevated 1650 Troponin elevated at 35 COVID-19 PCR negative  EKG personally reviewed.  Ventricular pacing with rate of 59.  CT ABDOMEN PELVIS WO  CONTRAST  Result Date: 04/03/2023 CLINICAL DATA:  Abdominal pain EXAM: CT ABDOMEN AND PELVIS WITHOUT CONTRAST TECHNIQUE: Multidetector CT imaging of the abdomen and pelvis was performed following the standard protocol without IV contrast. RADIATION DOSE REDUCTION: This exam was performed according to the departmental dose-optimization program which includes automated exposure control, adjustment of the mA and/or kV according to patient size and/or use of iterative reconstruction technique. COMPARISON:  CT 02/25/2023 FINDINGS: Lower chest: Enlarged heart. Status post median sternotomy. Defibrillator leads along the right side of the heart. Persistent small to moderate right pleural effusion. There is some bilateral areas of pleural thickening and some calcification. There is some bandlike changes along the lung bases once again. Areas of interstitial septal thickening. Hepatobiliary: Nodular contours of the liver. No obvious lesion on noncontrast imaging. Gallbladder is nondilated. Pancreas: Moderate atrophy of the pancreas, unchanged. Spleen: Spleen is nonenlarged. Adrenals/Urinary Tract: Adrenal glands are preserved. Severe atrophy of the left kidney. No abnormal calcification seen within either kidney nor along the course of either ureter. Preserved contours of the urinary bladder. 7 mm hyperdense lesion along the upper pole of the right kidney, unchanged from previous. Bosniak 2 lesion. No specific follow-up. Stomach/Bowel: Stomach is distended with fluid. The large bowel has a normal course and caliber with scattered stool. Left-sided colonic diverticula. Small bowel is nondilated. Third portion duodenal diverticulum. Vascular/Lymphatic: Normal caliber IVC. Aortic endograft in place once again, incompletely evaluated on this noncontrast examination. Scattered vascular calcifications otherwise. There are some small nodes identified in the retroperitoneum and pelvis which are not pathologic by size criteria and  unchanged from previous. Reproductive: Heterogeneous prostate. Other: Moderate ascites once again identified, similar to previous. Anasarca. There is some fluid along the right inguinal canal. Musculoskeletal: Moderate degenerative changes of the spine and pelvis. Trace retrolisthesis of L2 on L3 and L1 on L2. Multilevel stenosis. IMPRESSION: Overall appearance is similar to previous. Moderate ascites once again identified with stranding and anasarca. Nodular liver. No bowel obstruction, free air.  Colonic diverticula. Aortic endograft incompletely evaluated without contrast. Severe atrophy of the left kidney. Enlarged heart. Postop chest with defibrillator. Stable moderate right pleural effusion. Bilateral pleural thickening with calcification. Please correlate for any history of the environmental exposure. Electronically Signed   By: Karen Kays M.D.   On: 04/03/2023 12:31   DG Chest Portable 1 View  Result Date: 04/03/2023 CLINICAL DATA:  Shortness of breath EXAM: PORTABLE CHEST 1 VIEW COMPARISON:  CXR 02/25/23 FINDINGS: Left-sided dual lead cardiac device with unchanged lead positioning. Status post median sternotomy and CABG. Unchanged enlarged cardiac contours. Small right and likely a layering left-sided pleural effusion. There are hazy bibasilar airspace opacity which could represent atelectasis or infection. There are prominent bilateral interstitial opacities, favored to represent pulmonary edema. No radiographically apparent displaced rib fractures IMPRESSION: 1. Cardiomegaly, mild to moderate pulmonary edema, and small bilateral pleural effusions. 2. Hazy bibasilar airspace opacities could represent atelectasis or infection. Electronically Signed   By: Lorenza Cambridge M.D.   On: 04/03/2023 11:36    Results are pending, will review when available.  Assessment and Plan:  * Acute hypoxic respiratory failure (HCC) In the setting of acute on chronic HFrEF with exacerbation after patient discontinued  torsemide.  Exacerbated by ascites that has limited pulmonary  mobility.  - Continue supplemental oxygen to maintain oxygen saturation above 88% - Wean as tolerated - Ultrasound-guided paracentesis ordered - Management of heart failure as noted below  Acute on chronic systolic congestive heart failure (HCC) Secondary to discontinuation of torsemide.  Patient has a history of HFrEF with last EF of less than 20%.  BNP markedly elevated at 1600.  Recent hospitalization for similar presentation in April.  - Given severity of HFrEF, will consult cardiology; appreciate their recommendations - Start Lasix 60 mg IV twice daily - Continue GDMT - Daily weights - Strict in and out - Potassium replacement as ordered  Ascites - Ultrasound-guided paracentesis ordered  Acute kidney injury superimposed on chronic kidney disease (HCC) In the setting of cardiorenal syndrome.  Prior to admission in April 2024 for similar presentation, patient's creatinine ranged in the low 2s.  Last creatinine checked on 4/29 was 2.9.  Currently 3.7.  Likely due to acute on chronic cardiorenal syndrome.  I expect creatinine will improve with diuresis.  - Diuresis as noted above - Daily BMP - Strict in and out - Avoid other nephrotoxic agents as able  Chronic obstructive pulmonary disease, unspecified COPD type (HCC) I suspect wheezing on examination is cardiac in nature.  COPD has not been officially diagnosed, only clinically suspected in the past.  - DuoNebs as needed  Type 2 diabetes mellitus with diabetic chronic kidney disease (HCC) - Hold home antiglycemic agents - SSI, sensitive  Atherosclerotic heart disease of native coronary artery with unspecified angina pectoris (HCC) CAD s/p DES.  No chest pain at this time.  Mildly elevated troponins likely due to CKD and CHF exacerbation.  - Continue home aspirin and statin  Atrial fibrillation (HCC) EKG with ventricular pacing.  - Continue home  Eliquis  Essential (primary) hypertension - Monitor blood pressure closely while diuresing - Continue home antihypertensives for now  Advance Care Planning:   Code Status: Full Code.  I had a frank discussion with Mr. Grizzard regarding CODE STATUS.  I explained to him that if he underwent cardiac or pulmonary resuscitation, he is unlikely to come off life support given his severe comorbidities.Marland Kitchen  He replied "what other options to have?"  I explained that the alternative is to not undergo resuscitation and allow passing, and he states he does not want this.  To remain as a full code at this time.  Consults: Cardiology  Family Communication: No family at bedside  Severity of Illness: The appropriate patient status for this patient is INPATIENT. Inpatient status is judged to be reasonable and necessary in order to provide the required intensity of service to ensure the patient's safety. The patient's presenting symptoms, physical exam findings, and initial radiographic and laboratory data in the context of their chronic comorbidities is felt to place them at high risk for further clinical deterioration. Furthermore, it is not anticipated that the patient will be medically stable for discharge from the hospital within 2 midnights of admission.   * I certify that at the point of admission it is my clinical judgment that the patient will require inpatient hospital care spanning beyond 2 midnights from the point of admission due to high intensity of service, high risk for further deterioration and high frequency of surveillance required.*  Author: Verdene Lennert, MD 04/03/2023 1:51 PM  For on call review [email or web address].

## 2023-04-03 NOTE — Assessment & Plan Note (Signed)
In the setting of acute on chronic HFrEF with exacerbation after patient discontinued torsemide.  Exacerbated by ascites that has limited pulmonary mobility.  - Continue supplemental oxygen to maintain oxygen saturation above 88% - Wean as tolerated - Ultrasound-guided paracentesis ordered - Management of heart failure as noted below

## 2023-04-03 NOTE — Assessment & Plan Note (Signed)
In the setting of cardiorenal syndrome.  Prior to admission in April 2024 for similar presentation, patient's creatinine ranged in the low 2s.  Last creatinine checked on 4/29 was 2.9.  Currently 3.7.  Likely due to acute on chronic cardiorenal syndrome.  I expect creatinine will improve with diuresis.  - Diuresis as noted above - Daily BMP - Strict in and out - Avoid other nephrotoxic agents as able

## 2023-04-03 NOTE — Assessment & Plan Note (Addendum)
I suspect wheezing on examination is cardiac in nature.  COPD has not been officially diagnosed, only clinically suspected in the past.  - DuoNebs as needed

## 2023-04-03 NOTE — Assessment & Plan Note (Signed)
EKG with ventricular pacing.  - Continue home Eliquis

## 2023-04-03 NOTE — Progress Notes (Signed)
Visit completed. Patient informed on EP and RD appointment and 6 Minute walk test. Patient also informed of patient health questionnaires on My Chart. Patient Verbalizes understanding. Visit diagnosis can be found in [facility] 02/25/2023. Patient came in with mild shortness of breath. He states it was the walk down from his car. Checked patients oxygen 94% and heart rate 63. He managed to get his breath back a little with pursed lipped breathing. Ask the patient a few more questions and it was clear his shortness of breath was not getting better. He was willing to go the [place] walk in clinic. When transitioning him to a different wheel chair he was more short of breath and oxygen saturations were going into the 80s. Informed staff that he was feeling sick the past few days and states his weight has increased. Staff at [facility] took him to the ER.

## 2023-04-03 NOTE — Assessment & Plan Note (Signed)
CAD s/p DES.  No chest pain at this time.  Mildly elevated troponins likely due to CKD and CHF exacerbation.  - Continue home aspirin and statin

## 2023-04-03 NOTE — Assessment & Plan Note (Signed)
Ultrasound guided paracentesis ordered.

## 2023-04-03 NOTE — ED Notes (Signed)
Oral and axillary temp attempted with no success, pt refusing rectal temp due to discomfort of having to roll due to ascites.

## 2023-04-03 NOTE — Consult Note (Signed)
[facility] CLINIC CARDIOLOGY CONSULT NOTE       Patient ID: Taylor [name] MRN: [id number] DOB/AGE: [birth year] 82 y.o.  Admit date: 04/03/2023 Referring Physician Dr. Huel Cote Primary Physician Dr. Sherrie Mustache Primary Cardiologist Dr. Darrold Junker Reason for Consultation AoCHF  HPI: Taylor [name] is an 8yoM with a PMH of PMH of chronic HFrEF (EF 20%, moderate MR 02/17/2023), CAD s/p CABG 08/2016, hx VF arrest s/p MI in 1994 s/p dual-chamber ICD (2014) and recent battery change out 02/13/2023, paroxysmal AF (Eliquis), CKD 4, AAA s/p EVAR (2008), COPD, DM2 who presented to [facility] ED 04/03/2023 with abdominal distention and shortness of breath over the past 4 days.  Cardiology is consulted for further assistance.  History is obtained primarily from the patient's chart as he is rather uncomfortable and having significant conversational dyspnea.  He has known to our service from a prior admission in April 2024 for acute on chronic CHF.  He states that 4 days ago he was having significant worsening lower extremity edema, rapid abdominal distention, shortness of breath, and productive cough that have only worsened.  His wife states that he thinks one of his doctors (see either Dr. Darrold Junker or the heart failure clinic) told him to decrease his number of torsemide pills due to a lab abnormality, presumably an elevated creatinine.  He was previously prescribed torsemide 60 mg in the morning, and 40 mg at night (20 mg tablets) his wife tells me he was instructed to take only 2 pills/day (20 mg twice a day) but in the admission H&P there is documentation that he was told to stop taking his torsemide entirely on 4/29.  At time of evaluation he is sitting upright in ED stretcher with his wife present at bedside, his main complaint is significant abdominal pain from the tense ascites and lower extremity swelling.   Review of systems complete and found to be negative unless listed above     Past Medical History:  Diagnosis  Date   AAA (abdominal aortic aneurysm) (HCC) 06/03/2007   [facility]; Dr. Hart Rochester   AICD (automatic cardioverter/defibrillator) present    Arrhythmia    atrial fibrillation   Barrett's esophagus    Bladder cancer (HCC)    Bradycardia    CAD (coronary artery disease)    CAP (community acquired pneumonia) 11/13/2019   CHF (congestive heart failure) (HCC)    Cluster headache    COVID-19 09/27/2021   DDD (degenerative disc disease), lumbar    Diabetes mellitus without complication (HCC)    Dyspnea    WITH EXERTION   Edema    LEFT ANKLE   Fracture of skull base (HCC) 1997   due to fall   GERD (gastroesophageal reflux disease)    Gout    History of bladder cancer 12/1995   Hyperlipidemia    Hypertension    Hypocalcemia 04/19/2020   Possibly secondary to diuretics.   Low=5.9 04/19/2020   Malignant melanoma (HCC) 12/2012   right dorsal forearm excised   Myocardial infarction [facility])    LAST 2014   Osteoarthritis of knee    Other specified complication of vascular prosthetic devices, implants and grafts, initial encounter (HCC) 08/22/2021   Pacemaker 10/10/2006   Pancreatitis 11/22/2019   Pneumonia    2016   Pre-diabetes    Psoriasis    Rib fracture 1997   due to fall   Sleep apnea    CPAP   Stroke (HCC)    Venous incompetence     Past Surgical  History:  Procedure Laterality Date   ABDOMINAL AORTIC ANEURYSM REPAIR  06/03/2007   [facility]; Dr. Hart Rochester   ANGIOPLASTY  1994   MI   BLADDER TUMOR EXCISION  12/1995   CAROTID PTA/STENT INTERVENTION Left 07/23/2022   Procedure: CAROTID PTA/STENT INTERVENTION;  Surgeon: Renford Dills, MD;  Location: [facility] INVASIVE CV LAB;  Service: Cardiovascular;  Laterality: Left;   CATARACT EXTRACTION W/PHACO Left 10/22/2016   Procedure: CATARACT EXTRACTION PHACO AND INTRAOCULAR LENS PLACEMENT (IOC);  Surgeon: Galen Manila, MD;  Location: [facility] ORS;  Service: Ophthalmology;  Laterality: Left;  [place]  47.7 AP% 18.4 CDE 8.78 Fluid pack lot # [id number]   CATARACT EXTRACTION W/PHACO Right 12/10/2016   Procedure: CATARACT EXTRACTION PHACO AND INTRAOCULAR LENS PLACEMENT (IOC);  Surgeon: Galen Manila, MD;  Location: [facility] ORS;  Service: Ophthalmology;  Laterality: Right;  [place] 00:39 AP% 23.3 CDE 9.13 Fluid pack lot # [id number]   CORONARY ANGIOPLASTY     STENTS X 5   CORONARY ARTERY BYPASS GRAFT  09/22/2006   four   ELBOW BURSA SURGERY     DUE TO GOUT   INSERT / REPLACE / REMOVE PACEMAKER     MELANOMA EXCISION  12/2012   Right forearm   PPM GENERATOR CHANGEOUT N/A 02/13/2023   Procedure: PPM GENERATOR CHANGEOUT;  Surgeon: Marcina Millard, MD;  Location: [facility] INVASIVE CV LAB;  Service: Cardiovascular;  Laterality: N/A;    (Not in a hospital admission)  Social History   Socioeconomic History   Marital status: Married    Spouse name: Taylor [name]   Number of children: 3   Years of education: Not on file   Highest education level: 12th grade  Occupational History   Occupation: retired    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  Independent at baseline.   Social Determinants of Health   Financial Resource Strain: Low Risk  (05/07/2022)   Overall Financial Resource Strain (CARDIA)    Difficulty of Paying Living Expenses: Not hard at all  Food Insecurity: No Food Insecurity (04/03/2023)   Hunger Vital Sign    Worried About Running Out of Food in the Last Year: Never true    Ran Out of Food in the Last Year: Never true   Transportation Needs: No Transportation Needs (04/03/2023)   PRAPARE - Administrator, Civil Service (Medical): No    Lack of Transportation (Non-Medical): No  Physical Activity: Insufficiently Active (04/18/2021)   Exercise Vital Sign    Days of Exercise per Week: 1 day    Minutes of Exercise per Session: 120 min  Stress: No Stress Concern Present (05/07/2022)   [employer] of Occupational Health - Occupational Stress Questionnaire    Feeling of Stress : Not at all  Social Connections: Moderately Isolated (05/07/2022)   Social Connection and Isolation Panel [NHANES]    Frequency of Communication with Friends  and Family: Never    Frequency of Social Gatherings with Friends and Family: Once a week    Attends Religious Services: More than 4 times per year    Active Member of [employer] or Organizations: No    Attends Banker Meetings: Never    Marital Status: Married  Catering manager Violence: Not At Risk (04/03/2023)   Humiliation, Afraid, Rape, and Kick questionnaire    Fear of Current or Ex-Partner: No    Emotionally Abused: No    Physically Abused: No    Sexually Abused: No    Family History  Problem Relation Age of Onset   Cancer Mother        Melanoma skin cancer   Heart attack Father 74   Cancer Father        throat cancer   Arthritis Brother      No intake or output data in the 24 hours ending 04/03/23 1512  Vitals:   04/03/23 1041 04/03/23 1042 04/03/23 1044 04/03/23 1200  BP: 107/68   101/89  Pulse:   (!) 57 64  Resp: (!) 30   17  Temp:    (!) 96.1 F (35.6 C)  TempSrc:    Rectal  SpO2: (!) 80%  100% 99%  Weight:  89.4 kg    Height:  5\' 10"  (1.778 m)      PHYSICAL EXAM General: Uncomfortable appearing elderly Caucasian male, sitting upright in ED stretcher with wife at bedside.   HEENT:  Normocephalic and atraumatic. Neck:  No JVD.  Chest: Left infraclavicular area with well-healed pulse generator pocket incision with resolving  hematoma without evidence of infection, discharge, wound dehiscence Lungs: Conversational dyspnea on 6L by [place] decreased breath sounds with crackles bilaterally and wheezing in upper lung fields. Heart: HRRR . Normal S1 and S2 without gallops or murmurs.  Abdomen: Severely distended and tense abdomen with generalized tenderness to palpation. Msk: Normal strength and tone for age. Extremities: Chronic hyperpigmentation with 2+ pitting edema bilaterally  neuro: Alert and oriented X 3. Psych:  Answers questions appropriately.   Labs: Basic Metabolic Panel: Recent Labs    04/03/23 1044  NA 135  K 2.8*  CL 97*  CO2 27  GLUCOSE 108*  BUN 80*  CREATININE 3.77*  CALCIUM 8.6*  MG 2.5*   Liver Function Tests: Recent Labs    04/03/23 1044  AST 32  ALT 11  ALKPHOS 144*  BILITOT 2.5*  PROT 7.2  ALBUMIN 2.9*   No results for input(s): "LIPASE", "AMYLASE" in the last 72 hours. CBC: Recent Labs    04/03/23 1044  WBC 3.8*  NEUTROABS 2.2  HGB 11.6*  HCT 38.3*  MCV 100.5*  PLT 106*   Cardiac Enzymes: Recent Labs    04/03/23 1044  TROPONINIHS 35*   BNP: Recent Labs    04/03/23 1103  BNP 1,650.0*   D-Dimer: No results for input(s): "DDIMER" in the last 72 hours. Hemoglobin A1C: No results for input(s): "HGBA1C" in the last 72 hours. Fasting Lipid Panel: No results for input(s): "CHOL", "HDL", "LDLCALC", "TRIG", "CHOLHDL", "LDLDIRECT" in the last 72 hours. Thyroid Function Tests: No results for input(s): "TSH", "T4TOTAL", "T3FREE", "THYROIDAB" in the last 72 hours.  Invalid input(s): "FREET3" Anemia Panel: No results for input(s): "VITAMINB12", "FOLATE", "FERRITIN", "TIBC", "IRON", "RETICCTPCT" in the last 72 hours.   Radiology: CT ABDOMEN PELVIS WO CONTRAST  Result Date: 04/03/2023 CLINICAL DATA:  Abdominal pain EXAM: CT ABDOMEN AND PELVIS WITHOUT CONTRAST TECHNIQUE: Multidetector CT imaging of  the abdomen and pelvis was performed following the standard protocol  without IV contrast. RADIATION DOSE REDUCTION: This exam was performed according to the departmental dose-optimization program which includes automated exposure control, adjustment of the mA and/or kV according to patient size and/or use of iterative reconstruction technique. COMPARISON:  CT 02/25/2023 FINDINGS: Lower chest: Enlarged heart. Status post median sternotomy. Defibrillator leads along the right side of the heart. Persistent small to moderate right pleural effusion. There is some bilateral areas of pleural thickening and some calcification. There is some bandlike changes along the lung bases once again. Areas of interstitial septal thickening. Hepatobiliary: Nodular contours of the liver. No obvious lesion on noncontrast imaging. Gallbladder is nondilated. Pancreas: Moderate atrophy of the pancreas, unchanged. Spleen: Spleen is nonenlarged. Adrenals/Urinary Tract: Adrenal glands are preserved. Severe atrophy of the left kidney. No abnormal calcification seen within either kidney nor along the course of either ureter. Preserved contours of the urinary bladder. 7 mm hyperdense lesion along the upper pole of the right kidney, unchanged from previous. Bosniak 2 lesion. No specific follow-up. Stomach/Bowel: Stomach is distended with fluid. The large bowel has a normal course and caliber with scattered stool. Left-sided colonic diverticula. Small bowel is nondilated. Third portion duodenal diverticulum. Vascular/Lymphatic: Normal caliber IVC. Aortic endograft in place once again, incompletely evaluated on this noncontrast examination. Scattered vascular calcifications otherwise. There are some small nodes identified in the retroperitoneum and pelvis which are not pathologic by size criteria and unchanged from previous. Reproductive: Heterogeneous prostate. Other: Moderate ascites once again identified, similar to previous. Anasarca. There is some fluid along the right inguinal canal. Musculoskeletal: Moderate  degenerative changes of the spine and pelvis. Trace retrolisthesis of L2 on L3 and L1 on L2. Multilevel stenosis. IMPRESSION: Overall appearance is similar to previous. Moderate ascites once again identified with stranding and anasarca. Nodular liver. No bowel obstruction, free air.  Colonic diverticula. Aortic endograft incompletely evaluated without contrast. Severe atrophy of the left kidney. Enlarged heart. Postop chest with defibrillator. Stable moderate right pleural effusion. Bilateral pleural thickening with calcification. Please correlate for any history of the environmental exposure. Electronically Signed   By: Karen Kays M.D.   On: 04/03/2023 12:31   DG Chest Portable 1 View  Result Date: 04/03/2023 CLINICAL DATA:  Shortness of breath EXAM: PORTABLE CHEST 1 VIEW COMPARISON:  CXR 02/25/23 FINDINGS: Left-sided dual lead cardiac device with unchanged lead positioning. Status post median sternotomy and CABG. Unchanged enlarged cardiac contours. Small right and likely a layering left-sided pleural effusion. There are hazy bibasilar airspace opacity which could represent atelectasis or infection. There are prominent bilateral interstitial opacities, favored to represent pulmonary edema. No radiographically apparent displaced rib fractures IMPRESSION: 1. Cardiomegaly, mild to moderate pulmonary edema, and small bilateral pleural effusions. 2. Hazy bibasilar airspace opacities could represent atelectasis or infection. Electronically Signed   By: Lorenza Cambridge M.D.   On: 04/03/2023 11:36     TELEMETRY reviewed by me (LT) 04/03/2023 : Atrial fibrillation with occasional V pacing rate 60s  EKG reviewed by me: V paced rate 59  Data reviewed by me (LT) 04/03/2023: ED note, admission H&P, last outpatient cardiology note last 24h vitals tele labs imaging I/O   Principal Problem:   Acute hypoxic respiratory failure (HCC) Active Problems:   Essential (primary) hypertension   Atrial fibrillation (HCC)    Atherosclerotic heart disease of native coronary artery with unspecified angina pectoris (HCC)   Type 2 diabetes mellitus with diabetic chronic kidney disease (HCC)   Acute on  chronic systolic congestive heart failure (HCC)   Acute kidney injury superimposed on chronic kidney disease (HCC)   Chronic obstructive pulmonary disease, unspecified COPD type (HCC)   Ascites    ASSESSMENT AND PLAN:  Kush[place] Bigham. Rossano is an 72yoM with a PMH of PMH of chronic HFrEF (EF 20%, moderate MR 02/17/2023), CAD s/p CABG 08/2016, hx VF arrest s/p MI in 1994 s/p dual-chamber ICD (2014) and recent battery change out 02/13/2023, paroxysmal AF (Eliquis), CKD 4, AAA s/p EVAR (2008), COPD, DM2 who presented to [facility] ED 04/03/2023 with abdominal distention and shortness of breath over the past 4 days.  Cardiology is consulted for further assistance.  # Acute on chronic HFrEF # Ascites # Moderate MR The patient has had multiple changes on an outpatient basis with his diuretic dosing presumably due to worsening of his renal function, with some inconsistency/confusion by the patient and his wife with how much torsemide he was taking recently, if any at all.  On exam he is uncomfortable appearing with significant clinical evidence of volume overload with tense ascites.  Currently awaiting paracentesis, which he required during his last heart failure decompensation approximately 1 month ago.  Making a little urine after initial IV Lasix given in the emergency department. -Agree with paracentesis as soon as able -Continue IV Lasix 60 mg IV twice a day for now -Monitor and replenish electrolytes for a goal K >4, mag >2 -Further GDMT with BB limited by blood pressure, addition of SGLT2i, ARB/ARNI, MRA limited by renal dysfunction at present  # CKD 4 Current BUN/creatinine 80/3.77 and GFR of 15.  On 4/29 his creatinine was 2.9 and GFR was 21. -Monitor closely with diuresis.  # Paroxysmal atrial fibrillation # Hx VF arrest s/p  dual-chamber ICD In sinus rhythm on telemetry, anticoagulated with Eliquis 2.5 mg twice daily.  # CAD s/p CABG  # Demand ischemia Chest pain-free.  Initial troponin elevated at 35, which is likely demand in the setting of acute on chronic CHF and chronically elevated with renal insufficiency and not ACS.   This patient's plan of care was discussed and created with Dr. Darrold Junker and he is in agreement.  Signed: Rebeca Allegra , PA-C 04/03/2023, 3:12 PM [facility] Cardiology

## 2023-04-03 NOTE — ED Notes (Signed)
Charge RN made aware that pt needs to be bedded.

## 2023-04-03 NOTE — ED Triage Notes (Signed)
Patient brought over by [facility] for sob. Patient reports starting Sunday he had cold symptoms with cough and now experiencing SOB.   KC reported 80s O2 sats.   Patient placed on 4L in triage with sats of 88%

## 2023-04-03 NOTE — ED Notes (Signed)
Unable to obtain temp in triage  

## 2023-04-03 NOTE — ED Notes (Signed)
Report given to [facility], pt going to ED rm 32.

## 2023-04-03 NOTE — ED Notes (Signed)
Nasal pulse ox switched out and a better pleth obtained, pt on [place] at 6L/min to help with comfort, pt does appear very uncomfortable due to ADB distention and ascites. Pt is pleasant and calm at this time.

## 2023-04-04 ENCOUNTER — Inpatient Hospital Stay: Payer: Medicare Other

## 2023-04-04 LAB — BODY FLUID CELL COUNT WITH DIFFERENTIAL
Eos, Fluid: 0 %
Lymphs, Fluid: 80 %
Monocyte-Macrophage-Serous Fluid: 11 %
Neutrophil Count, Fluid: 9 %
Total Nucleated Cell Count, Fluid: 244 cu mm

## 2023-04-04 LAB — BASIC METABOLIC PANEL
Anion gap: 8 (ref 5–15)
BUN: 77 mg/dL — ABNORMAL HIGH (ref 8–23)
CO2: 25 mmol/L (ref 22–32)
Calcium: 8.5 mg/dL — ABNORMAL LOW (ref 8.9–10.3)
Chloride: 101 mmol/L (ref 98–111)
Creatinine, Ser: 3.66 mg/dL — ABNORMAL HIGH (ref 0.61–1.24)
GFR, Estimated: 16 mL/min — ABNORMAL LOW (ref >60)
Glucose, Bld: 118 mg/dL — ABNORMAL HIGH (ref 70–99)
Potassium: 3.6 mmol/L (ref 3.5–5.1)
Sodium: 134 mmol/L — ABNORMAL LOW (ref 135–145)

## 2023-04-04 LAB — PROTEIN, PLEURAL OR PERITONEAL FLUID: Total protein, fluid: 3.5 g/dL

## 2023-04-04 LAB — BODY FLUID CULTURE W GRAM STAIN

## 2023-04-04 LAB — PATHOLOGIST SMEAR REVIEW

## 2023-04-04 LAB — CULTURE, BLOOD (ROUTINE X 2)

## 2023-04-04 LAB — TROPONIN I (HIGH SENSITIVITY): Troponin I (High Sensitivity): 29 ng/L — ABNORMAL HIGH (ref <18)

## 2023-04-04 LAB — ALBUMIN, PLEURAL OR PERITONEAL FLUID: Albumin, Fluid: 1.5 g/dL

## 2023-04-04 MED ORDER — FUROSEMIDE 10 MG/ML IJ SOLN
60.0000 mg | Freq: Two times a day (BID) | INTRAMUSCULAR | Status: DC
  Administered 2023-04-04: 60 mg via INTRAVENOUS

## 2023-04-04 MED ORDER — FUROSEMIDE 10 MG/ML IJ SOLN
INTRAMUSCULAR | Status: AC
  Filled 2023-04-04: qty 8

## 2023-04-04 MED ORDER — LIDOCAINE HCL (PF) 1 % IJ SOLN
20.0000 mL | Freq: Once | INTRAMUSCULAR | Status: AC
  Administered 2023-04-04: 20 mL via INTRADERMAL

## 2023-04-04 NOTE — TOC Initial Note (Signed)
Transition of Care [facility]) - Initial/Assessment Note    Patient Details  Name: Taylor [name] MRN: [id number] Date of Birth: 1941/01/16  [facility] CM/SW Contact:    Darolyn Rua, LCSW Phone Number: 04/04/2023, 2:30 PM  Clinical Narrative:                  CSW met with patient and spouse at bedside to review [facility] recommendations made by PT and OT. They requested list of outpatient PT providers, provided.  They report patient is starting cardiac rehab Thursday  5/16, they would like to see how that goes before adding additional services and appointments.   They are aware to reach out to their PCP if they would like additional services, or inform cardiac rehab of their additional service needs.   Patient currently has home o2 through [employer], they report no further discharge needs.   Expected Discharge Plan: Home/Self Care Barriers to Discharge: Continued Medical Work up   Patient Goals and CMS Choice Patient states their goals for this hospitalization and ongoing recovery are:: to go home CMS Medicare.gov Compare Post Acute Care list provided to:: Patient Choice offered to / list presented to : Patient      Expected Discharge Plan and Services       Living arrangements for the past 2 months: Single Family Home                                      Prior Living Arrangements/Services Living arrangements for the past 2 months: Single Family Home Lives with:: Spouse                   Activities of Daily Living Home Assistive Devices/Equipment: Eyeglasses ADL Screening (condition at time of admission) Patient's cognitive ability adequate to safely complete daily activities?: Yes Is the patient deaf or have difficulty hearing?: No Does the patient have difficulty seeing, even when wearing glasses/contacts?: No Does the patient have difficulty concentrating, remembering, or making decisions?: No Patient able to express need for assistance with ADLs?:  Yes Does the patient have difficulty dressing or bathing?: No Independently performs ADLs?: Yes (appropriate for developmental age) Does the patient have difficulty walking or climbing stairs?: No Weakness of Legs: None Weakness of Arms/Hands: None  Permission Sought/Granted                  Emotional Assessment              Admission diagnosis:  Acute hypoxic respiratory failure (HCC) [J96.01] Patient Active Problem List   Diagnosis Date Noted   Acute hypoxic respiratory failure (HCC) 04/03/2023   Ascites 02/25/2023   Hyperbilirubinemia 02/25/2023   Chronic obstructive pulmonary disease, unspecified COPD type (HCC) 01/03/2023   Severe mitral regurgitation 10/31/2022   HFrEF (heart failure with reduced ejection fraction) (HCC) 10/31/2022   Acute on chronic respiratory failure with hypoxia (HCC) 10/31/2022   Weakness 10/30/2022   Acute pulmonary edema (HCC) 10/30/2022   DOE (dyspnea on exertion) 10/30/2022   Hypoxia 10/30/2022   Abdominal aortic aneurysm (AAA) (HCC) 10/30/2022   Dyspnea 10/30/2022   Orthopnea 10/15/2022   Acute on chronic systolic congestive heart failure (HCC) 10/15/2022   Carotid stenosis, symptomatic w/o infarct, left 07/05/2022   Atrophy of kidney 09/27/2021   Atherosclerotic heart disease of native coronary artery with unspecified angina pectoris (HCC) 08/22/2021   Implantable cardioverter-defibrillator (ICD) in situ  08/22/2021   Hypertension 08/22/2021   Idiopathic gout, unspecified site 08/22/2021   Pure hypercholesterolemia, unspecified 08/22/2021   Aortic atherosclerosis (HCC) 05/08/2021   Type 2 diabetes mellitus with diabetic chronic kidney disease (HCC) 11/21/2020   Acute kidney injury superimposed on chronic kidney disease (HCC) 08/10/2020   Hypomagnesemia 04/26/2020   Numbness and tingling in both hands 04/19/2020   Atrial fibrillation (HCC) 04/19/2020   Other specified interstitial pulmonary diseases (HCC) 12/06/2019   Abdominal  pain    Hyperuricemia 03/31/2019   Numbness 05/07/2018   Benign colonic polyp 03/24/2017   Tibialis anterior tenosynovitis 12/21/2015   Pleural nodule 07/27/2015   Restrictive lung disease 05/12/2015   Barrett esophagus 03/30/2015   Ache in joint 03/30/2015   Bradycardia 03/30/2015   CAD S/P percutaneous coronary angioplasty 03/30/2015   Cardiac defibrillator in place 03/30/2015   Chronic kidney disease, stage 4 (severe) (HCC) 03/30/2015   Cluster headache syndrome 03/30/2015   Degeneration of lumbar or lumbosacral intervertebral disc 03/30/2015   Gout 03/30/2015   Personal history of malignant neoplasm of bladder 03/30/2015   Hypercholesteremia 03/30/2015   LBP (low back pain) 03/30/2015   Personal history of malignant melanoma of skin 03/30/2015   Arthritis of knee, degenerative 03/30/2015   Obstructive sleep apnea 03/30/2015   Chronic venous insufficiency 03/30/2015   Psoriatic arthritis (HCC) 03/30/2015   History of abdominal aortic aneurysm (AAA) 06/24/2013   History of CVA (cerebrovascular accident) 06/24/2013   Cardiomyopathy, ischemic 06/24/2013   Essential (primary) hypertension 08/17/2011   Arrhythmia, sinus node 08/17/2011   History of ventricular fibrillation 08/17/2011   Systolic CHF with reduced left ventricular function, NYHA class 3 (HCC) 02/26/2010   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 04/27/2007   Cardiac pacemaker in situ 10/10/2006   PCP:  Malva Limes, MD Pharmacy:   [facility] [id number] [place], [place] - [address] AT [facility] OF [address] [place] [address] [place] [address] Phone: [phone] Fax: [phone]     Social Determinants of Health (SDOH) Social History: SDOH Screenings   Food Insecurity: No Food Insecurity (04/03/2023)  Housing: Low Risk  (04/03/2023)  Transportation Needs: No Transportation Needs (04/03/2023)  Utilities: Not At Risk (04/03/2023)  Alcohol Screen: Low Risk  (01/03/2023)  Depression  (PHQ2-9): High Risk (01/03/2023)  Financial Resource Strain: Low Risk  (05/07/2022)  Physical Activity: Insufficiently Active (04/18/2021)  Social Connections: Moderately Isolated (05/07/2022)  Stress: No Stress Concern Present (05/07/2022)  Tobacco Use: Medium Risk (04/03/2023)   SDOH Interventions:     Readmission Risk Interventions     No data to display

## 2023-04-04 NOTE — Progress Notes (Signed)
   Heart Failure Nurse Navigator Note  Attempted x3 to see the patient.  Each time he was asleep.  Nurse had reported he did not sleep well the night before due to being very uncomfortable.  Tresa Endo RN

## 2023-04-04 NOTE — Evaluation (Signed)
Occupational Therapy Evaluation Patient Details Name: Taylor [name] MRN: [id number] DOB: 02-13-1941 Today's Date: 04/04/2023   History of Present Illness Pt is an 82 y.o. male with medical history significant of decompensated cirrhosis, HFrEF with last EF of less than 20% s/p AICD, CAD s/p DES, CKD stage IV, psoriasis, CVA, hypertension, restrictive lung disease, atrial fibrillation on Eliquis, type 2 diabetes, who presents to the ED due to shortness of breath. MD assessment includes: acute hypoxic respiratory failure, acute on chronic systolic congestive heart failure, ascites, and acute kidney injury superimposed on chronic kidney disease.  Pt now s/p US guided diagnostic and therapeutic paracentesis from LLQ on 5/10 that yielded 6.6 L of clear, amber fluid.   Clinical Impression   Patient presenting with decreased functional mobility, limited activity tolerance and reporting "stiffness" but overall close to baseline. Vital signs assessed throughout OT tx - resp rate fluctuating up to 38 with activity, use of 2L SPO2% and BP assessed 93/69 seated EOB end of tx. Performs functional mobility to bathroom and down hallway with supervision.  Patient IND in ADLs/IADLs including driving PTA, 2L WUJ8% at baseline. Patient currently functioning with generalized weakness and impairments in overall endurance d/t SOB and respiratory issues. Patient will benefit from acute OT to increase overall independence in the areas of ADLs, functional mobility, and to receive skilled education on energy conservation strategies in order to safely discharge.        Recommendations for follow up therapy are one component of a multi-disciplinary discharge planning process, led by the attending physician.  Recommendations may be updated based on patient status, additional functional criteria and insurance authorization.   Assistance Recommended at Discharge None        Equipment Recommendations  None recommended by OT     Recommendations for Other Services       Precautions / Restrictions Precautions Precautions: Fall Restrictions Weight Bearing Restrictions: No      Mobility Bed Mobility Overal bed mobility: Modified Independent                  Transfers Overall transfer level: Modified independent Equipment used: None Transfers: Sit to/from Stand Sit to Stand: Supervision           General transfer comment: Good eccentric and concentric control and stability      Balance Overall balance assessment: No apparent balance deficits (not formally assessed)                                         ADL either performed or assessed with clinical judgement   ADL Overall ADL's : Needs assistance/impaired                         Toilet Transfer: Supervision/safety   Toileting- Clothing Manipulation and Hygiene: Supervision/safety   Tub/ Engineer, structural: Supervision/safety;Ambulation   Functional mobility during ADLs: Supervision/safety General ADL Comments: Pt performing functional mobility in hallway ~200+ ft with supervison, standing void with supervision, donning/doffing shoes with setup, anticipate supervsion overall for ADLs with cuing for energy conservation with increased resp rate      Pertinent Vitals/Pain Pain Assessment Pain Assessment: No/denies pain     Hand Dominance Right   Extremity/Trunk Assessment Upper Extremity Assessment Upper Extremity Assessment: Overall WFL for tasks assessed (Able to don/doff shoes seated EOB)   Lower Extremity Assessment Lower Extremity Assessment: Generalized weakness  Communication Communication Communication: No difficulties   Cognition Arousal/Alertness: Awake/alert Behavior During Therapy: WFL for tasks assessed/performed Overall Cognitive Status: Within Functional Limits for tasks assessed                                                  Home Living  Family/patient expects to be discharged to:: Private residence Living Arrangements: Spouse/significant other Available Help at Discharge: Family;Available 24 hours/day Type of Home: House Home Access: Stairs to enter [employer] of Steps: 3 Entrance Stairs-Rails: Right;Left;Can reach both Home Layout: One level     Bathroom Shower/Tub: Chief Strategy Officer: Handicapped height     Home Equipment: None   Additional Comments: Pt very active, rides bike and exercises frequently      Prior Functioning/Environment Prior Level of Function : Independent/Modified Independent             Mobility Comments: Ind amb community distances without an AD, rides indoor stationary bike and outdoor bike for exercise, no fall history ADLs Comments: Ind with ADLs        OT Problem List: Cardiopulmonary status limiting activity      OT Treatment/Interventions: Other (comment);Energy conservation;Therapeutic activities;Self-care/ADL training;Patient/family education (Energy conservation)    OT Goals(Current goals can be found in the care plan section) Acute Rehab OT Goals Patient Stated Goal: Go home OT Goal Formulation: With patient Time For Goal Achievement: 04/18/23 Potential to Achieve Goals: Good ADL Goals Pt Will Transfer to Toilet: with modified independence;ambulating Pt Will Perform Tub/Shower Transfer: with modified independence;ambulating Additional ADL Goal #1: Pt will demonstrate energy conservation strategies during ADL tasks IND for efficient occupational performance  OT Frequency: Min 1X/week       AM-PAC OT "6 Clicks" Daily Activity     Outcome Measure Help from another person eating meals?: None Help from another person taking care of personal grooming?: None Help from another person toileting, which includes using toliet, bedpan, or urinal?: A Little Help from another person bathing (including washing, rinsing, drying)?: A Little Help from  another person to put on and taking off regular upper body clothing?: None Help from another person to put on and taking off regular lower body clothing?: None 6 Click Score: 22   End of Session Equipment Utilized During Treatment: Oxygen (2L) Nurse Communication: Other (comment) (Fluid intake, RN cleared pt for water)  Activity Tolerance: Patient tolerated treatment well;Other (comment) (Tolerated tx well, after functional mobility BP 93/69, resp rate fluctuating <38 during tx) Patient left: in bed;with call bell/phone within reach  OT Visit Diagnosis: Other (comment) (ADL/IADL performance limited by SOB)                Time: 6295-2841 OT Time Calculation (min): 22 min Charges:  OT General Charges $OT Visit: 1 Visit OT Evaluation $OT Eval Low Complexity: 1 Low OT Treatments $Self Care/Home Management : 8-22 mins  Shalon Councilman L. Trystin Terhune, OTR/L  04/04/23, 3:32 PM

## 2023-04-04 NOTE — Procedures (Signed)
PROCEDURE SUMMARY:  Successful US guided diagnostic and therapeutic paracentesis from LLQ.  Yielded 6.6 L of clear, amber fluid.  No immediate complications.  Pt tolerated well.   Specimen was sent for labs.  EBL < 1 mL  Shon Hough, AGNP 04/04/2023 10:54 AM

## 2023-04-04 NOTE — Progress Notes (Signed)
[facility] CLINIC CARDIOLOGY CONSULT NOTE       Patient ID: Taylor [name] MRN: [id number] DOB/AGE: 81-Apr-1942 81 y.o.  Admit date: 04/03/2023 Referring Physician Dr. Huel Cote Primary Physician Dr. Sherrie Mustache Primary Cardiologist Dr. Darrold Junker Reason for Consultation AoCHF  HPI: Taylor [name] is an 82yoM with a PMH of PMH of chronic HFrEF (EF 20%, moderate MR 02/17/2023), CAD s/p CABG 08/2016, hx VF arrest s/p MI in 1994 s/p dual-chamber ICD (2014) and recent battery change out 02/13/2023, paroxysmal AF (Eliquis), CKD 4, AAA s/p EVAR (2008), COPD, DM2 who presented [facility] ED 04/03/2023 with abdominal distention and shortness of breath over the past 4 days.  Cardiology is consulted for further assistance.  Interval History:  -Seen and examined early morning, remains significantly uncomfortable from his abdominal distention and ascites, awaiting paracentesis -No chest pain, still short of breath with an oxygen requirement.  Main complaint is abdominal pain   Review of systems complete and found to be negative unless listed above     Past Medical History:  Diagnosis Date   AAA (abdominal aortic aneurysm) (HCC) 06/03/2007   [facility]; Dr. Hart Rochester   AICD (automatic cardioverter/defibrillator) present    Arrhythmia    atrial fibrillation   Barrett's esophagus    Bladder cancer (HCC)    Bradycardia    CAD (coronary artery disease)    CAP (community acquired pneumonia) 11/13/2019   CHF (congestive heart failure) (HCC)    Cluster headache    COVID-19 09/27/2021   DDD (degenerative disc disease), lumbar    Diabetes mellitus without complication (HCC)    Dyspnea    WITH EXERTION   Edema    LEFT ANKLE   Fracture of skull base (HCC) 1997   due to fall   GERD (gastroesophageal reflux disease)    Gout    History of bladder cancer 12/1995   Hyperlipidemia    Hypertension    Hypocalcemia 04/19/2020   Possibly secondary to diuretics.   Low=5.9 04/19/2020   Malignant  melanoma (HCC) 12/2012   right dorsal forearm excised   Myocardial infarction [facility])    LAST 2014   Osteoarthritis of knee    Other specified complication of vascular prosthetic devices, implants and grafts, initial encounter (HCC) 08/22/2021   Pacemaker 10/10/2006   Pancreatitis 11/22/2019   Pneumonia    2016   Pre-diabetes    Psoriasis    Rib fracture 1997   due to fall   Sleep apnea    CPAP   Stroke [facility])    Venous incompetence     Past Surgical History:  Procedure Laterality Date   ABDOMINAL AORTIC ANEURYSM REPAIR  06/03/2007   [facility]; Dr. Hart Rochester   ANGIOPLASTY  1994   [place]   BLADDER TUMOR EXCISION  12/1995   CAROTID PTA/STENT INTERVENTION Left 07/23/2022   Procedure: CAROTID PTA/STENT INTERVENTION;  Surgeon: Renford Dills, MD;  Location: [facility] INVASIVE CV LAB;  Service: Cardiovascular;  Laterality: Left;   CATARACT EXTRACTION W/PHACO Left 10/22/2016   Procedure: CATARACT EXTRACTION PHACO AND INTRAOCULAR LENS PLACEMENT (IOC);  Surgeon: Galen Manila, MD;  Location: [facility] ORS;  Service: Ophthalmology;  Laterality: Left;  [place] 47.7 AP% 18.4 CDE 8.78 Fluid pack lot # [id number]   CATARACT EXTRACTION W/PHACO Right 12/10/2016   Procedure: CATARACT EXTRACTION PHACO AND INTRAOCULAR LENS PLACEMENT (IOC);  Surgeon: Galen Manila, MD;  Location: [facility] ORS;  Service: Ophthalmology;  Laterality: Right;  [place] 00:39 AP% 23.3 CDE 9.13 Fluid pack lot #  [id number]   CORONARY ANGIOPLASTY     STENTS X 5   CORONARY ARTERY BYPASS GRAFT  09/22/2006   four   ELBOW BURSA SURGERY     DUE TO GOUT   INSERT / REPLACE / REMOVE PACEMAKER     MELANOMA EXCISION  12/2012   Right forearm   PPM GENERATOR CHANGEOUT N/A 02/13/2023   Procedure: PPM GENERATOR CHANGEOUT;  Surgeon: Marcina Millard, MD;  Location: [facility] INVASIVE CV LAB;  Service: Cardiovascular;  Laterality: N/A;    (Not in a hospital admission)  Social History   Socioeconomic History   Marital status: Married     Spouse name: [name]   Number of children: 3   Years of education: Not on file   Highest education level: 12th grade  Occupational History   Occupation: retired    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  Independent at baseline.   Social Determinants of Health   Financial Resource Strain: Low Risk  (05/07/2022)   Overall Financial Resource Strain (CARDIA)    Difficulty of Paying Living Expenses: Not hard at all  Food Insecurity: No Food Insecurity (04/03/2023)   Hunger Vital Sign    Worried About Running Out of Food in the Last Year: Never true    Ran Out of Food in the Last Year: Never true  Transportation Needs: No Transportation Needs (04/03/2023)   PRAPARE - Administrator, Civil Service (Medical): No    Lack of Transportation (Non-Medical): No  Physical Activity: Insufficiently Active (04/18/2021)   Exercise Vital Sign    Days of Exercise per Week: 1 day    Minutes of Exercise per Session: 120 min  Stress: No Stress Concern Present (05/07/2022)   [employer] of Occupational Health - Occupational Stress Questionnaire    Feeling of Stress : Not at all  Social Connections: Moderately Isolated (05/07/2022)   Social Connection and Isolation Panel [NHANES]    Frequency of Communication with Friends and Family: Never    Frequency of Social Gatherings with Friends and Family: Once a week    Attends Religious Services: More than 4 times per year    Active Member of [employer] or Organizations: No    Attends  Banker Meetings: Never    Marital Status: Married  Catering manager Violence: Not At Risk (04/03/2023)   Humiliation, Afraid, Rape, and Kick questionnaire    Fear of Current or Ex-Partner: No    Emotionally Abused: No    Physically Abused: No    Sexually Abused: No    Family History  Problem Relation Age of Onset   Cancer Mother        Melanoma skin cancer   Heart attack Father 2   Cancer Father        throat cancer   Arthritis Brother      No intake or output data in  the 24 hours ending 04/04/23 0844  Vitals:   04/04/23 0313 04/04/23 0315 04/04/23 0615 04/04/23 0630  BP:  (!) 101/56  (!) 100/57  Pulse:  62 61 (!) 56  Resp:  20 20 (!) 26  Temp: (!) 97 F (36.1 C)     TempSrc: Oral     SpO2:  99% 100% 97%  Weight:      Height:        PHYSICAL EXAM General: Uncomfortable appearing elderly Caucasian male, laying at low incline in bed without family present  HEENT:  Normocephalic and atraumatic. Neck:  No JVD.  Chest: Left infraclavicular area with well-healed pulse generator pocket incision with resolving hematoma without evidence of infection, discharge, wound dehiscence Lungs: Conversational dyspnea on oxygen by Bennett decreased breath sounds with crackles bilaterally without appreciable wheezes. Heart: HRRR . Normal S1 and S2, 3/6 holosystolic murmur best heard at the apex Abdomen: Severely distended and tense abdomen with generalized tenderness to palpation. Msk: Normal strength and tone for age. Extremities: Chronic hyperpigmentation with 2+ pitting edema bilaterally  neuro: Alert and oriented X 3. Psych:  Answers questions appropriately.   Labs: Basic Metabolic Panel: Recent Labs    04/03/23 1044 04/04/23 0445  NA 135 134*  K 2.8* 3.6  CL 97* 101  CO2 27 25  GLUCOSE 108* 118*  BUN 80* 77*  CREATININE 3.77* 3.66*  CALCIUM 8.6* 8.5*  MG 2.5*  --     Liver Function Tests: Recent Labs    04/03/23 1044  AST 32  ALT 11  ALKPHOS 144*   BILITOT 2.5*  PROT 7.2  ALBUMIN 2.9*    No results for input(s): "LIPASE", "AMYLASE" in the last 72 hours. CBC: Recent Labs    04/03/23 1044  WBC 3.8*  NEUTROABS 2.2  HGB 11.6*  HCT 38.3*  MCV 100.5*  PLT 106*    Cardiac Enzymes: Recent Labs    04/03/23 1044 04/04/23 0445  TROPONINIHS 35* 29*    BNP: Recent Labs    04/03/23 1103  BNP 1,650.0*    D-Dimer: No results for input(s): "DDIMER" in the last 72 hours. Hemoglobin A1C: No results for input(s): "HGBA1C" in the last 72 hours. Fasting Lipid Panel: No results for input(s): "CHOL", "HDL", "LDLCALC", "TRIG", "CHOLHDL", "LDLDIRECT" in the last 72 hours. Thyroid Function Tests: No results for input(s): "TSH", "T4TOTAL", "T3FREE", "THYROIDAB" in the last 72 hours.  Invalid input(s): "FREET3" Anemia Panel: No results for input(s): "VITAMINB12", "FOLATE", "FERRITIN", "TIBC", "IRON", "RETICCTPCT" in the last 72 hours.   Radiology: CT ABDOMEN PELVIS WO CONTRAST  Result Date: 04/03/2023 CLINICAL DATA:  Abdominal pain EXAM: CT ABDOMEN AND PELVIS WITHOUT CONTRAST TECHNIQUE: Multidetector CT imaging of the abdomen and pelvis was performed following the standard protocol without IV contrast. RADIATION DOSE REDUCTION: This exam was performed according to the departmental dose-optimization program which includes automated exposure control, adjustment of the mA and/or kV according to patient size and/or use of iterative reconstruction technique. COMPARISON:  CT 02/25/2023 FINDINGS: Lower chest: Enlarged heart. Status post median sternotomy. Defibrillator leads along the right side of the heart. Persistent small to moderate right pleural effusion. There is some bilateral areas of pleural thickening and some calcification. There is some bandlike changes along the lung bases once again. Areas of interstitial septal thickening. Hepatobiliary: Nodular contours of the liver. No obvious lesion on noncontrast imaging. Gallbladder is  nondilated. Pancreas: Moderate atrophy of the pancreas, unchanged. Spleen: Spleen is nonenlarged. Adrenals/Urinary Tract: Adrenal glands are preserved. Severe atrophy of  the left kidney. No abnormal calcification seen within either kidney nor along the course of either ureter. Preserved contours of the urinary bladder. 7 mm hyperdense lesion along the upper pole of the right kidney, unchanged from previous. Bosniak 2 lesion. No specific follow-up. Stomach/Bowel: Stomach is distended with fluid. The large bowel has a normal course and caliber with scattered stool. Left-sided colonic diverticula. Small bowel is nondilated. Third portion duodenal diverticulum. Vascular/Lymphatic: Normal caliber IVC. Aortic endograft in place once again, incompletely evaluated on this noncontrast examination. Scattered vascular calcifications otherwise. There are some small nodes identified in the retroperitoneum and pelvis which are not pathologic by size criteria and unchanged from previous. Reproductive: Heterogeneous prostate. Other: Moderate ascites once again identified, similar to previous. Anasarca. There is some fluid along the right inguinal canal. Musculoskeletal: Moderate degenerative changes of the spine and pelvis. Trace retrolisthesis of L2 on L3 and L1 on L2. Multilevel stenosis. IMPRESSION: Overall appearance is similar to previous. Moderate ascites once again identified with stranding and anasarca. Nodular liver. No bowel obstruction, free air.  Colonic diverticula. Aortic endograft incompletely evaluated without contrast. Severe atrophy of the left kidney. Enlarged heart. Postop chest with defibrillator. Stable moderate right pleural effusion. Bilateral pleural thickening with calcification. Please correlate for any history of the environmental exposure. Electronically Signed   By: Karen Kays M.D.   On: 04/03/2023 12:31   DG Chest Portable 1 View  Result Date: 04/03/2023 CLINICAL DATA:  Shortness of breath EXAM:  PORTABLE CHEST 1 VIEW COMPARISON:  CXR 02/25/23 FINDINGS: Left-sided dual lead cardiac device with unchanged lead positioning. Status post median sternotomy and CABG. Unchanged enlarged cardiac contours. Small right and likely a layering left-sided pleural effusion. There are hazy bibasilar airspace opacity which could represent atelectasis or infection. There are prominent bilateral interstitial opacities, favored to represent pulmonary edema. No radiographically apparent displaced rib fractures IMPRESSION: 1. Cardiomegaly, mild to moderate pulmonary edema, and small bilateral pleural effusions. 2. Hazy bibasilar airspace opacities could represent atelectasis or infection. Electronically Signed   By: Lorenza Cambridge M.D.   On: 04/03/2023 11:36    EKG reviewed by me: V paced rate 59  Data reviewed by me (LT) 04/04/2023: Nursing notes, IR note, hospitalist progress note last 24h vitals tele labs imaging I/O   Principal Problem:   Acute hypoxic respiratory failure (HCC) Active Problems:   Essential (primary) hypertension   Atrial fibrillation (HCC)   Atherosclerotic heart disease of native coronary artery with unspecified angina pectoris (HCC)   Type 2 diabetes mellitus with diabetic chronic kidney disease (HCC)   Acute on chronic systolic congestive heart failure (HCC)   Acute kidney injury superimposed on chronic kidney disease (HCC)   Chronic obstructive pulmonary disease, unspecified COPD type (HCC)   Ascites    ASSESSMENT AND PLAN:  Taylor [name]. Taylor [name] is an 74yoM with a PMH of PMH of chronic HFrEF (EF 20%, moderate MR 02/17/2023), CAD s/p CABG 08/2016, hx VF arrest s/p MI in 1994 s/p dual-chamber ICD (2014) and recent battery change out 02/13/2023, paroxysmal AF (Eliquis), CKD 4, AAA s/p EVAR (2008), COPD, DM2 who presented to [facility] ED 04/03/2023 with abdominal distention and shortness of breath over the past 4 days.  Cardiology is consulted for further assistance.  # Acute on chronic HFrEF # Ascites #  Moderate MR The patient has had multiple changes on an outpatient basis with his diuretic dosing presumably due to worsening of his renal function, with some inconsistency/confusion by the patient and his wife with how  much torsemide he was taking recently, if any at all.  On exam he remains uncomfortable appearing with significant clinical evidence of volume overload with tense ascites.  Currently awaiting paracentesis, which he required during his last heart failure decompensation approximately 1 month ago.   -Greatly appreciate IR assistance with paracentesis -S/p IV Lasix 60 mg IV x 1 this morning, recheck BMP tomorrow morning before further dosing -Anticipate need for daily diuretics at discharge with clear instructions on when/how much to take  -Monitor and replenish electrolytes for a goal K >4, mag >2 -Further GDMT with BB limited by blood pressure, addition of SGLT2i, ARB/ARNI, MRA limited by renal dysfunction at present  # CKD 4 Current BUN/creatinine 80/3.77 and GFR of 15.  On 4/29 his creatinine was 2.9 and GFR was 21. -Monitor closely with diuresis.  # Paroxysmal atrial fibrillation # Hx VF arrest s/p dual-chamber ICD Rate controlled on telemetry,  anticoagulated with Eliquis 2.5 mg twice daily.  # CAD s/p CABG  # Demand ischemia Chest pain-free.  Initial troponin elevated at 35, which is likely demand in the setting of acute on chronic CHF and chronically elevated with renal insufficiency and not ACS.  This patient's plan of care was discussed and created with Dr. Darrold Junker and he is in agreement.  Signed: Rebeca Allegra , PA-C 04/04/2023, 8:44 AM [facility]

## 2023-04-04 NOTE — Evaluation (Signed)
Physical Therapy Evaluation Patient Details Name: Taylor [name] MRN: [id number] DOB: Aug 26, 1941 Today's Date: 04/04/2023  History of Present Illness  Pt is an 82 y.o. male with medical history significant of decompensated cirrhosis, HFrEF with last EF of less than 20% s/p AICD, CAD s/p DES, CKD stage IV, psoriasis, CVA, hypertension, restrictive lung disease, atrial fibrillation on Eliquis, type 2 diabetes, who presents to the ED due to shortness of breath. MD assessment includes: acute hypoxic respiratory failure, acute on chronic systolic congestive heart failure, ascites, and acute kidney injury superimposed on chronic kidney disease.  Pt now s/p US guided diagnostic and therapeutic paracentesis from LLQ on 5/10 that yielded 6.6 L of clear, amber fluid.   Clinical Impression  Pt was pleasant and motivated to participate during the session and put forth good effort throughout. Pt required some extra time and effort with functional tasks but no physical assistance.  Pt was generally steady with transfers and gait without an AD as self-selected by patient.  Pt reported feeling somewhat weaker than at baseline but felt subjectively safe and steady with amb without an AD.  Pt reported no adverse symptoms during the session with SpO2 99-100% on 2LO2/min and HR WNL.  Pt will benefit from continued PT services upon discharge to safely address deficits listed in patient problem list for decreased caregiver assistance and eventual return to PLOF.         Recommendations for follow up therapy are one component of a multi-disciplinary discharge planning process, led by the attending physician.  Recommendations may be updated based on patient status, additional functional criteria and insurance authorization.  Follow Up Recommendations       Assistance Recommended at Discharge Intermittent Supervision/Assistance  Patient can return home with the following  A little help with walking and/or transfers;Help  with stairs or ramp for entrance;Assist for transportation;A little help with bathing/dressing/bathroom    Equipment Recommendations None recommended by PT  Recommendations for Other Services       Functional Status Assessment Patient has had a recent decline in their functional status and demonstrates the ability to make significant improvements in function in a reasonable and predictable amount of time.     Precautions / Restrictions Precautions Precautions: Fall Restrictions Weight Bearing Restrictions: No      Mobility  Bed Mobility Overal bed mobility: Modified Independent             General bed mobility comments: Min extra time and effort only    Transfers Overall transfer level: Needs assistance Equipment used: None Transfers: Sit to/from Stand Sit to Stand: Supervision           General transfer comment: Good eccentric and concentric control and stability    Ambulation/Gait Ambulation/Gait assistance: Supervision Gait Distance (Feet): 150 Feet Assistive device: None Gait Pattern/deviations: Step-through pattern, Decreased step length - right, Decreased step length - left Gait velocity: decreased     General Gait Details: Min decreased cadence but steady without LOB or adverse symptoms on 2LO2/min  Stairs            Wheelchair Mobility    Modified Rankin (Stroke Patients Only)       Balance Overall balance assessment: No apparent balance deficits (not formally assessed)                                           Pertinent Vitals/Pain  Pain Assessment Pain Assessment: No/denies pain    Home Living Family/patient expects to be discharged to:: Private residence Living Arrangements: Spouse/significant other Available Help at Discharge: Family;Available 24 hours/day Type of Home: House Home Access: Stairs to enter Entrance Stairs-Rails: Right;Left;Can reach both Entrance Stairs-Number of Steps: 3   Home Layout: One  level Home Equipment: None      Prior Function Prior Level of Function : Independent/Modified Independent             Mobility Comments: Ind amb community distances without an AD, rides indoor stationary bike and outdoor bike for exercise, no fall history ADLs Comments: Ind with ADLs     Hand Dominance   Dominant Hand: Right    Extremity/Trunk Assessment   Upper Extremity Assessment Upper Extremity Assessment: Overall WFL for tasks assessed    Lower Extremity Assessment Lower Extremity Assessment: Generalized weakness       Communication   Communication: No difficulties  Cognition Arousal/Alertness: Awake/alert Behavior During Therapy: WFL for tasks assessed/performed Overall Cognitive Status: Within Functional Limits for tasks assessed                                          General Comments      Exercises     Assessment/Plan    PT Assessment Patient needs continued PT services  PT Problem List Decreased strength;Decreased activity tolerance;Decreased balance;Decreased mobility       PT Treatment Interventions DME instruction;Gait training;Stair training;Functional mobility training;Therapeutic activities;Therapeutic exercise;Balance training;Patient/family education    PT Goals (Current goals can be found in the Care Plan section)  Acute Rehab PT Goals Patient Stated Goal: To get stronger PT Goal Formulation: With patient Time For Goal Achievement: 04/17/23 Potential to Achieve Goals: Good    Frequency Min 2X/week     Co-evaluation               AM-PAC PT "6 Clicks" Mobility  Outcome Measure Help needed turning from your back to your side while in a flat bed without using bedrails?: None Help needed moving from lying on your back to sitting on the side of a flat bed without using bedrails?: None Help needed moving to and from a bed to a chair (including a wheelchair)?: A Little Help needed standing up from a chair using  your arms (e.g., wheelchair or bedside chair)?: A Little Help needed to walk in hospital room?: A Little Help needed climbing 3-5 steps with a railing? : A Little 6 Click Score: 20    End of Session Equipment Utilized During Treatment: Gait belt;Oxygen Activity Tolerance: Patient tolerated treatment well Patient left: in bed;with call bell/phone within reach Nurse Communication: Mobility status PT Visit Diagnosis: Muscle weakness (generalized) (M62.81);Difficulty in walking, not elsewhere classified (R26.2)    Time: 1610-9604 PT Time Calculation (min) (ACUTE ONLY): 25 min   Charges:   PT Evaluation $PT Eval Moderate Complexity: 1 Mod        D. Scott Colyn Miron PT, DPT 04/04/23, 11:51 AM

## 2023-04-04 NOTE — Progress Notes (Signed)
PROGRESS NOTE    Taylor [name]  WJX:[id number] DOB: Jul 19, 1941 DOA: 04/03/2023 PCP: Malva Limes, MD   Brief Narrative:  This 82 yrs old male with PMH significant of decompensated Liver cirrhosis, HFrEF with last EF of less than 20% s/p AICD, CAD s/p DES, CKD stage IV, psoriasis, CVA, hypertension, restrictive lung disease, atrial fibrillation on Eliquis, type 2 diabetes, who presents to the ED due to shortness of breath. Patient reports worsening lower extremity swelling, abdominal distention, shortness of breath and productive cough for last few days.  He reports he found relief only with hot showers that will help him cough up his mucus. His abdominal pain radiates down into his right groin, He reports that his cardiologist has told him to stop torsemide. Since then he has not taken any diuretics. On arrival in the ED He was hypertensive and hypoxic requiring 4 L of supplemental oxygen. Serum creatinine 3.77, troponin elevated at 35,  BNP 1650.  Chest x-ray shows cardiomegaly with mild to moderate pulmonary edema and bilateral pleural effusion.  CT abdomen shows moderate ascites with anasarca, nodular liver and severe atrophy of left kidney.  Patient was admitted for further evaluation.  Assessment & Plan:   Principal Problem:   Acute hypoxic respiratory failure (HCC) Active Problems:   Acute on chronic systolic congestive heart failure (HCC)   Ascites   Acute kidney injury superimposed on chronic kidney disease (HCC)   Essential (primary) hypertension   Atrial fibrillation (HCC)   Atherosclerotic heart disease of native coronary artery with unspecified angina pectoris (HCC)   Type 2 diabetes mellitus with diabetic chronic kidney disease (HCC)   Chronic obstructive pulmonary disease, unspecified COPD type (HCC)  Acute hypoxic respiratory failure: Acute on chronic HFrEF: Patient presented with worsening shortness of breath, abdominal distention, bilateral leg swelling likely due to not  taking torsemide due to worsening of his renal functions. Continue supplemental oxygen to maintain oxygen saturation above 88% Patient underwent ultrasound guided paracentesis.  6.6 L of clear fluid drained. Patient reports feeling much relief. Recent LVEF less than 20%.  BNP markedly elevated 1650. Continue IV Lasix 60 mg every 12. Continue GDMT. Monitor daily weight, intake output charting. Cardiology is consulted.Follow up recommendation.  Decompensated liver cirrhosis with ascites: Patient underwent ultrasound-guided paracentesis.   6.6 L of clear fluid drained.  AKI on CKD stage IV: Likely in the setting of cardiorenal syndrome. Prior serum creatinine less than 2. Creatinine on admission 3.7.  Likely due to acute on chronic CHF. Serum creatinine is improving. Continue IV diuresis with Lasix 60 mg IV every 12 hours.  COPD: COPD has not been officially diagnosed, only clinically suspected in the past. Continue DuoNeb as needed.   Type 2 diabetes with CKD: Hold p.o. diabetic medications. SSI, sensitive   Coronary artery disease: CAD s/p DES.  No chest pain at this time.   Mildly elevated troponins likely due to CKD and CHF exacerbation. Continue home aspirin and statin.   Paroxysmal Atrial fibrillation [facility]): EKG with ventricular pacing.  Telemetry shows normal sinus rhythm. Heart rate is well-controlled.  Continue home Eliquis   Essential hypertension: Monitor blood pressure closely while diuresing Continue home antihypertensives for now    DVT prophylaxis:Eliquis Code Status: Full code Family Communication: No family at bed side. Disposition Plan:    Status is: Inpatient Remains inpatient appropriate because: Admitted for acute on chronic hypoxic respiratory failure and ascites.  Patient underwent paracentesis tolerated well.  Cardiology is consulted    Consultants:  Cardiology  Procedures: Paracentesis Antimicrobials: Anti-infectives (From admission,  onward)    None       Subjective: Patient was seen and examined at bedside.  Overnight events noted.   Patient reports feeling much improved and relieved after patient had a paracentesis.  6.6 L of clear fluid drained.  He tolerated procedure well.  Objective: Vitals:   04/04/23 1130 04/04/23 1200 04/04/23 1300 04/04/23 1400  BP:  108/75 111/78 108/81  Pulse: 60 60 60 (!) 59  Resp: 19 19 20 20   Temp:   97.8 F (36.6 C)   TempSrc:   Oral   SpO2: 100% 99% 97% 98%  Weight:      Height:        Intake/Output Summary (Last 24 hours) at 04/04/2023 1525 Last data filed at 04/04/2023 5621 Gross per 24 hour  Intake --  Output 300 ml  Net -300 ml   Filed Weights   04/03/23 1042  Weight: 89.4 kg    Examination:  General exam: Appears calm and comfortable, deconditioned, not in any acute distress. Respiratory system: Decreased breath sounds, respiratory effort normal.  RR 16. Cardiovascular system: S1 & S2 heard, regular rate and rhythm, no murmur. Gastrointestinal system: Abdomen is soft, distended, mildly tender, BS+ Central nervous system: Alert and oriented x 3. No focal neurological deficits. Extremities: Edema+,no cyanosis, no clubbing Skin: No rashes, lesions or ulcers Psychiatry: Judgement and insight appear normal. Mood & affect appropriate.     Data Reviewed: I have personally reviewed following labs and imaging studies  CBC: Recent Labs  Lab 04/03/23 1044  WBC 3.8*  NEUTROABS 2.2  HGB 11.6*  HCT 38.3*  MCV 100.5*  PLT 106*   Basic Metabolic Panel: Recent Labs  Lab 04/03/23 1044 04/04/23 0445  NA 135 134*  K 2.8* 3.6  CL 97* 101  CO2 27 25  GLUCOSE 108* 118*  BUN 80* 77*  CREATININE 3.77* 3.66*  CALCIUM 8.6* 8.5*  MG 2.5*  --    GFR: Estimated Creatinine Clearance: 18.1 mL/min (A) (by C-G formula based on SCr of 3.66 mg/dL (H)). Liver Function Tests: Recent Labs  Lab 04/03/23 1044  AST 32  ALT 11  ALKPHOS 144*  BILITOT 2.5*  PROT 7.2   ALBUMIN 2.9*   No results for input(s): "LIPASE", "AMYLASE" in the last 168 hours. No results for input(s): "AMMONIA" in the last 168 hours. Coagulation Profile: No results for input(s): "INR", "PROTIME" in the last 168 hours. Cardiac Enzymes: No results for input(s): "CKTOTAL", "CKMB", "CKMBINDEX", "TROPONINI" in the last 168 hours. BNP (last 3 results) No results for input(s): "PROBNP" in the last 8760 hours. HbA1C: No results for input(s): "HGBA1C" in the last 72 hours. CBG: No results for input(s): "GLUCAP" in the last 168 hours. Lipid Profile: No results for input(s): "CHOL", "HDL", "LDLCALC", "TRIG", "CHOLHDL", "LDLDIRECT" in the last 72 hours. Thyroid Function Tests: No results for input(s): "TSH", "T4TOTAL", "FREET4", "T3FREE", "THYROIDAB" in the last 72 hours. Anemia Panel: No results for input(s): "VITAMINB12", "FOLATE", "FERRITIN", "TIBC", "IRON", "RETICCTPCT" in the last 72 hours. Sepsis Labs: No results for input(s): "PROCALCITON", "LATICACIDVEN" in the last 168 hours.  Recent Results (from the past 240 hour(s))  SARS Coronavirus 2 by RT PCR (hospital order, performed in [facility] hospital lab) *cepheid single result test* Anterior Nasal Swab     Status: None   Collection Time: 04/03/23 11:03 AM   Specimen: Anterior Nasal Swab  Result Value Ref Range Status   SARS Coronavirus 2 by  RT PCR NEGATIVE NEGATIVE Final    Comment: (NOTE) SARS-CoV-2 target nucleic acids are NOT DETECTED.  The SARS-CoV-2 RNA is generally detectable in upper and lower respiratory specimens during the acute phase of infection. The lowest concentration of SARS-CoV-2 viral copies this assay can detect is 250 copies / mL. A negative result does not preclude SARS-CoV-2 infection and should not be used as the sole basis for treatment or other patient management decisions.  A negative result may occur with improper specimen collection / handling, submission of specimen other than nasopharyngeal  swab, presence of viral mutation(s) within the areas targeted by this assay, and inadequate number of viral copies (<250 copies / mL). A negative result must be combined with clinical observations, patient history, and epidemiological information.  Fact Sheet for Patients:   RoadLapTop.co.za  Fact Sheet for Healthcare Providers: http://kim-miller.com/  This test is not yet approved or  cleared by the [place] FDA and has been authorized for detection and/or diagnosis of SARS-CoV-2 by FDA under an Emergency Use Authorization (EUA).  This EUA will remain in effect (meaning this test can be used) for the duration of the COVID-19 declaration under Section 564(b)(1) of the Act, 21 U.S.C. section 360bbb-3(b)(1), unless the authorization is terminated or revoked sooner.  Performed at [facility], [address] [place], [place] [address]   Blood culture (routine x 2)     Status: None (Preliminary result)   Collection Time: 04/03/23 12:11 PM   Specimen: BLOOD  Result Value Ref Range Status   Specimen Description BLOOD BLOOD RIGHT ARM  Final   Special Requests   Final    BOTTLES DRAWN AEROBIC AND ANAEROBIC Blood Culture results may not be optimal due to an inadequate volume of blood received in culture bottles   Culture   Final    NO GROWTH < 24 HOURS Performed at [facility], [address] [place], [place] [address]    Report Status PENDING  Incomplete  Blood culture (routine x 2)     Status: None (Preliminary result)   Collection Time: 04/03/23 12:16 PM   Specimen: BLOOD  Result Value Ref Range Status   Specimen Description BLOOD BLOOD LEFT HAND  Final   Special Requests   Final    BOTTLES DRAWN AEROBIC AND ANAEROBIC Blood Culture results may not be optimal due to an inadequate volume of blood received in culture bottles   Culture   Final    NO GROWTH < 24 HOURS Performed at [facility], [address], [place], [place] [address]    Report Status PENDING  Incomplete    Radiology Studies: US Paracentesis  Result Date: 04/04/2023 INDICATION: 82 year old male with CHF exacerbation with recurrent ascites. Request received for diagnostic and therapeutic paracentesis. EXAM: ULTRASOUND GUIDED DIAGNOSTIC AND THERAPEUTIC LEFT LOWER QUADRANT PARACENTESIS MEDICATIONS: 20 mL 1% lidocaine COMPLICATIONS: None immediate. PROCEDURE: Informed written consent was obtained from the patient after a discussion of the risks, benefits and alternatives to treatment. A timeout was performed prior to the initiation of the procedure. Initial ultrasound scanning demonstrates a moderate amount of ascites within the left lower abdominal quadrant. The right lower abdomen was prepped and draped in the usual sterile fashion. 1% lidocaine was used for local anesthesia. Following this, a 19 gauge, 7-cm, Yueh catheter was introduced. An ultrasound image was saved for documentation purposes. The paracentesis was performed. The catheter was removed and a dressing was applied. The patient tolerated the procedure well without immediate post procedural  complication. FINDINGS: A total of approximately 6.6 L of clear, amber fluid was removed. Samples were sent to the laboratory as requested by the clinical team. IMPRESSION: Successful ultrasound-guided paracentesis yielding 6.6 liters of peritoneal fluid. Procedure performed by Alex Gardener, NP and supervised by Malachy Moan, MD Electronically Signed   By: Malachy Moan M.D.   On: 04/04/2023 12:51   CT ABDOMEN PELVIS WO CONTRAST  Result Date: 04/03/2023 CLINICAL DATA:  Abdominal pain EXAM: CT ABDOMEN AND PELVIS WITHOUT CONTRAST TECHNIQUE: Multidetector CT imaging of the abdomen and pelvis was performed following the standard protocol without IV contrast. RADIATION DOSE REDUCTION: This exam was performed according to the departmental dose-optimization program which includes  automated exposure control, adjustment of the mA and/or kV according to patient size and/or use of iterative reconstruction technique. COMPARISON:  CT 02/25/2023 FINDINGS: Lower chest: Enlarged heart. Status post median sternotomy. Defibrillator leads along the right side of the heart. Persistent small to moderate right pleural effusion. There is some bilateral areas of pleural thickening and some calcification. There is some bandlike changes along the lung bases once again. Areas of interstitial septal thickening. Hepatobiliary: Nodular contours of the liver. No obvious lesion on noncontrast imaging. Gallbladder is nondilated. Pancreas: Moderate atrophy of the pancreas, unchanged. Spleen: Spleen is nonenlarged. Adrenals/Urinary Tract: Adrenal glands are preserved. Severe atrophy of the left kidney. No abnormal calcification seen within either kidney nor along the course of either ureter. Preserved contours of the urinary bladder. 7 mm hyperdense lesion along the upper pole of the right kidney, unchanged from previous. Bosniak 2 lesion. No specific follow-up. Stomach/Bowel: Stomach is distended with fluid. The large bowel has a normal course and caliber with scattered stool. Left-sided colonic diverticula. Small bowel is nondilated. Third portion duodenal diverticulum. Vascular/Lymphatic: Normal caliber IVC. Aortic endograft in place once again, incompletely evaluated on this noncontrast examination. Scattered vascular calcifications otherwise. There are some small nodes identified in the retroperitoneum and pelvis which are not pathologic by size criteria and unchanged from previous. Reproductive: Heterogeneous prostate. Other: Moderate ascites once again identified, similar to previous. Anasarca. There is some fluid along the right inguinal canal. Musculoskeletal: Moderate degenerative changes of the spine and pelvis. Trace retrolisthesis of L2 on L3 and L1 on L2. Multilevel stenosis. IMPRESSION: Overall  appearance is similar to previous. Moderate ascites once again identified with stranding and anasarca. Nodular liver. No bowel obstruction, free air.  Colonic diverticula. Aortic endograft incompletely evaluated without contrast. Severe atrophy of the left kidney. Enlarged heart. Postop chest with defibrillator. Stable moderate right pleural effusion. Bilateral pleural thickening with calcification. Please correlate for any history of the environmental exposure. Electronically Signed   By: Karen Kays M.D.   On: 04/03/2023 12:31   DG Chest Portable 1 View  Result Date: 04/03/2023 CLINICAL DATA:  Shortness of breath EXAM: PORTABLE CHEST 1 VIEW COMPARISON:  CXR 02/25/23 FINDINGS: Left-sided dual lead cardiac device with unchanged lead positioning. Status post median sternotomy and CABG. Unchanged enlarged cardiac contours. Small right and likely a layering left-sided pleural effusion. There are hazy bibasilar airspace opacity which could represent atelectasis or infection. There are prominent bilateral interstitial opacities, favored to represent pulmonary edema. No radiographically apparent displaced rib fractures IMPRESSION: 1. Cardiomegaly, mild to moderate pulmonary edema, and small bilateral pleural effusions. 2. Hazy bibasilar airspace opacities could represent atelectasis or infection. Electronically Signed   By: Lorenza Cambridge M.D.   On: 04/03/2023 11:36    Scheduled Meds:  sodium chloride flush  3 mL Intravenous Q12H  Continuous Infusions:  sodium chloride       LOS: 1 day    Time spent: 50 mins    Willeen Niece, MD [facility]   If 7PM-7AM, please contact night-coverage

## 2023-04-05 LAB — CBC
HCT: 39.4 % (ref 39.0–52.0)
Hemoglobin: 11.9 g/dL — ABNORMAL LOW (ref 13.0–17.0)
MCH: 29.9 pg (ref 26.0–34.0)
MCHC: 30.2 g/dL (ref 30.0–36.0)
MCV: 99 fL (ref 80.0–100.0)
Platelets: 101 10*3/uL — ABNORMAL LOW (ref 150–400)
RBC: 3.98 MIL/uL — ABNORMAL LOW (ref 4.22–5.81)
RDW: 18.6 % — ABNORMAL HIGH (ref 11.5–15.5)
WBC: 4.1 10*3/uL (ref 4.0–10.5)
nRBC: 0 % (ref 0.0–0.2)

## 2023-04-05 LAB — COMPREHENSIVE METABOLIC PANEL
ALT: 11 U/L (ref 0–44)
AST: 29 U/L (ref 15–41)
Albumin: 2.5 g/dL — ABNORMAL LOW (ref 3.5–5.0)
Alkaline Phosphatase: 141 U/L — ABNORMAL HIGH (ref 38–126)
Anion gap: 7 (ref 5–15)
BUN: 82 mg/dL — ABNORMAL HIGH (ref 8–23)
CO2: 26 mmol/L (ref 22–32)
Calcium: 8.9 mg/dL (ref 8.9–10.3)
Chloride: 103 mmol/L (ref 98–111)
Creatinine, Ser: 3.63 mg/dL — ABNORMAL HIGH (ref 0.61–1.24)
GFR, Estimated: 16 mL/min — ABNORMAL LOW (ref >60)
Glucose, Bld: 92 mg/dL (ref 70–99)
Potassium: 3.8 mmol/L (ref 3.5–5.1)
Sodium: 136 mmol/L (ref 135–145)
Total Bilirubin: 2.6 mg/dL — ABNORMAL HIGH (ref 0.3–1.2)
Total Protein: 6.6 g/dL (ref 6.5–8.1)

## 2023-04-05 LAB — MAGNESIUM: Magnesium: 2.3 mg/dL (ref 1.7–2.4)

## 2023-04-05 LAB — PHOSPHORUS: Phosphorus: 5.4 mg/dL — ABNORMAL HIGH (ref 2.5–4.6)

## 2023-04-05 LAB — BODY FLUID CULTURE W GRAM STAIN: Culture: NO GROWTH

## 2023-04-05 MED ORDER — APIXABAN 2.5 MG PO TABS
2.5000 mg | ORAL_TABLET | Freq: Two times a day (BID) | ORAL | Status: DC
  Administered 2023-04-05 – 2023-04-06 (×2): 2.5 mg via ORAL
  Filled 2023-04-05 (×2): qty 1

## 2023-04-05 MED ORDER — FUROSEMIDE 10 MG/ML IJ SOLN
60.0000 mg | Freq: Once | INTRAMUSCULAR | Status: AC
  Administered 2023-04-05: 60 mg via INTRAVENOUS
  Filled 2023-04-05: qty 6

## 2023-04-05 NOTE — Progress Notes (Signed)
PROGRESS NOTE    Taylor [name]  GNF:[id number] DOB: 06/04/1941 DOA: 04/03/2023 PCP: Malva Limes, MD   Brief Narrative:  This 82 yrs old male with PMH significant of decompensated Liver cirrhosis, HFrEF with last EF of less than 20% s/p AICD, CAD s/p DES, CKD stage IV, psoriasis, CVA, hypertension, restrictive lung disease, atrial fibrillation on Eliquis, type 2 diabetes, who presents to the ED due to shortness of breath. Patient reports worsening lower extremity swelling, abdominal distention, shortness of breath and productive cough for last few days.  He reports he found relief only with hot showers that will help him cough up his mucus. His abdominal pain radiates down into his right groin, He reports that his cardiologist has told him to stop torsemide. Since then he has not taken any diuretics. On arrival in the ED He was hypertensive and hypoxic requiring 4 L of supplemental oxygen. Serum creatinine 3.77, troponin elevated at 35,  BNP 1650.  Chest x-ray shows cardiomegaly with mild to moderate pulmonary edema and bilateral pleural effusion.  CT abdomen shows moderate ascites with anasarca, nodular liver and severe atrophy of left kidney.  Patient was admitted for further evaluation.  Assessment & Plan:   Principal Problem:   Acute hypoxic respiratory failure (HCC) Active Problems:   Acute on chronic systolic congestive heart failure (HCC)   Ascites   Acute kidney injury superimposed on chronic kidney disease (HCC)   Essential (primary) hypertension   Atrial fibrillation (HCC)   Atherosclerotic heart disease of native coronary artery with unspecified angina pectoris (HCC)   Type 2 diabetes mellitus with diabetic chronic kidney disease (HCC)   Chronic obstructive pulmonary disease, unspecified COPD type (HCC)  Acute hypoxic respiratory failure: Acute on chronic HFrEF: Patient presented with worsening shortness of breath, abdominal distention, bilateral leg swelling likely due to not  taking torsemide due to worsening of his renal functions. Continue supplemental oxygen to maintain oxygen saturation above 88% Patient underwent ultrasound guided paracentesis.  6.6 L of clear fluid drained. Patient reports feeling much relief. Recent LVEF less than 20%.  BNP markedly elevated 1650. S/p IV Lasix 60 mg x 1 5/10 and 5/11 Continue GDMT. Monitor daily weight, intake output charting. Cardiology is consulted.  Continue IV diuresis. Can be changed to oral Lasix tomorrow.  Decompensated liver cirrhosis with ascites: Patient underwent ultrasound-guided paracentesis.   6.6 L of clear fluid drained. He feels much improved.  AKI on CKD stage IV: Likely in the setting of cardiorenal syndrome. Prior serum creatinine less than 2. Creatinine on admission 3.7.  Likely due to acute on chronic CHF. Serum creatinine is improving. 3.77 > 3.66 >3.63 Monitor serum creatinine while on lasix.  COPD: COPD has not been officially diagnosed, only clinically suspected in the past. Continue DuoNeb as needed.   Type 2 diabetes with CKD: Hold p.o. diabetic medications. SSI, sensitive   Coronary artery disease: CAD s/p DES.  No chest pain at this time.   Mildly elevated troponins likely due to CKD and CHF exacerbation. Continue home aspirin and statin.   Paroxysmal Atrial fibrillation [facility]): EKG with ventricular pacing.  Telemetry shows normal sinus rhythm. Heart rate is well-controlled.  Continue home Eliquis   Essential hypertension: Monitor blood pressure closely while diuresing Continue home antihypertensives for now    DVT prophylaxis:Eliquis Code Status: Full code Family Communication: No family at bed side. Disposition Plan:    Status is: Inpatient Remains inpatient appropriate because: Admitted for acute on chronic hypoxic respiratory failure and ascites.  Patient underwent paracentesis tolerated well.  Cardiology is consulted, Continue current management.     Consultants:  Cardiology  Procedures: Paracentesis Antimicrobials: Anti-infectives (From admission, onward)    None       Subjective: Patient was seen and examined at bedside.  Overnight events noted.   Patient reports feeling much improved after having paracentesis. He has been ambulating in the room without any difficulty breathing.  Objective: Vitals:   04/04/23 1648 04/04/23 2346 04/05/23 0306 04/05/23 0843  BP: 106/88 107/76 114/82 116/85  Pulse: 61 60 63 66  Resp: 18 18 20 18   Temp: 97.8 F (36.6 C) (!) 97.4 F (36.3 C) (!) 97.5 F (36.4 C)   TempSrc:      SpO2: 100% 99% 99% 100%  Weight:      Height:        Intake/Output Summary (Last 24 hours) at 04/05/2023 1219 Last data filed at 04/05/2023 1037 Gross per 24 hour  Intake 120 ml  Output --  Net 120 ml   Filed Weights   04/03/23 1042  Weight: 89.4 kg    Examination:  General exam: Appears comfortable, deconditioned, not in any acute distress. Respiratory system: Decreased breath sounds, respiratory effort normal.  RR 15. Cardiovascular system: S1 & S2 heard, regular rate and rhythm, no murmur. Gastrointestinal system: Abdomen is soft, distended, mildly tender, BS+ Central nervous system: Alert and oriented x 3. No focal neurological deficits. Extremities: Edema+,no cyanosis, no clubbing Skin: No rashes, lesions or ulcers Psychiatry: Judgement and insight appear normal. Mood & affect appropriate.     Data Reviewed: I have personally reviewed following labs and imaging studies  CBC: Recent Labs  Lab 04/03/23 1044 04/05/23 0444  WBC 3.8* 4.1  NEUTROABS 2.2  --   HGB 11.6* 11.9*  HCT 38.3* 39.4  MCV 100.5* 99.0  PLT 106* 101*   Basic Metabolic Panel: Recent Labs  Lab 04/03/23 1044 04/04/23 0445 04/05/23 0444  NA 135 134* 136  K 2.8* 3.6 3.8  CL 97* 101 103  CO2 27 25 26   GLUCOSE 108* 118* 92  BUN 80* 77* 82*  CREATININE 3.77* 3.66* 3.63*  CALCIUM 8.6* 8.5* 8.9  MG 2.5*  --  2.3   PHOS  --   --  5.4*   GFR: Estimated Creatinine Clearance: 18.3 mL/min (A) (by C-G formula based on SCr of 3.63 mg/dL (H)). Liver Function Tests: Recent Labs  Lab 04/03/23 1044 04/05/23 0444  AST 32 29  ALT 11 11  ALKPHOS 144* 141*  BILITOT 2.5* 2.6*  PROT 7.2 6.6  ALBUMIN 2.9* 2.5*   No results for input(s): "LIPASE", "AMYLASE" in the last 168 hours. No results for input(s): "AMMONIA" in the last 168 hours. Coagulation Profile: No results for input(s): "INR", "PROTIME" in the last 168 hours. Cardiac Enzymes: No results for input(s): "CKTOTAL", "CKMB", "CKMBINDEX", "TROPONINI" in the last 168 hours. BNP (last 3 results) No results for input(s): "PROBNP" in the last 8760 hours. HbA1C: No results for input(s): "HGBA1C" in the last 72 hours. CBG: No results for input(s): "GLUCAP" in the last 168 hours. Lipid Profile: No results for input(s): "CHOL", "HDL", "LDLCALC", "TRIG", "CHOLHDL", "LDLDIRECT" in the last 72 hours. Thyroid Function Tests: No results for input(s): "TSH", "T4TOTAL", "FREET4", "T3FREE", "THYROIDAB" in the last 72 hours. Anemia Panel: No results for input(s): "VITAMINB12", "FOLATE", "FERRITIN", "TIBC", "IRON", "RETICCTPCT" in the last 72 hours. Sepsis Labs: No results for input(s): "PROCALCITON", "LATICACIDVEN" in the last 168 hours.  Recent Results (from the past  240 hour(s))  SARS Coronavirus 2 by RT PCR (hospital order, performed in [facility] hospital lab) *cepheid single result test* Anterior Nasal Swab     Status: None   Collection Time: 04/03/23 11:03 AM   Specimen: Anterior Nasal Swab  Result Value Ref Range Status   SARS Coronavirus 2 by RT PCR NEGATIVE NEGATIVE Final    Comment: (NOTE) SARS-CoV-2 target nucleic acids are NOT DETECTED.  The SARS-CoV-2 RNA is generally detectable in upper and lower respiratory specimens during the acute phase of infection. The lowest concentration of SARS-CoV-2 viral copies this assay can detect is 250 copies  / mL. A negative result does not preclude SARS-CoV-2 infection and should not be used as the sole basis for treatment or other patient management decisions.  A negative result may occur with improper specimen collection / handling, submission of specimen other than nasopharyngeal swab, presence of viral mutation(s) within the areas targeted by this assay, and inadequate number of viral copies (<250 copies / mL). A negative result must be combined with clinical observations, patient history, and epidemiological information.  Fact Sheet for Patients:   RoadLapTop.co.za  Fact Sheet for Healthcare Providers: http://kim-miller.com/  This test is not yet approved or  cleared by the [place] FDA and has been authorized for detection and/or diagnosis of SARS-CoV-2 by FDA under an Emergency Use Authorization (EUA).  This EUA will remain in effect (meaning this test can be used) for the duration of the COVID-19 declaration under Section 564(b)(1) of the Act, 21 U.S.C. section 360bbb-3(b)(1), unless the authorization is terminated or revoked sooner.  Performed at [facility], [address] [place], [place] [address]   Blood culture (routine x 2)     Status: None (Preliminary result)   Collection Time: 04/03/23 12:11 PM   Specimen: BLOOD  Result Value Ref Range Status   Specimen Description BLOOD BLOOD RIGHT ARM  Final   Special Requests   Final    BOTTLES DRAWN AEROBIC AND ANAEROBIC Blood Culture results may not be optimal due to an inadequate volume of blood received in culture bottles   Culture   Final    NO GROWTH 2 DAYS Performed at [facility], [address], [place], [place] [address]    Report Status PENDING  Incomplete  Blood culture (routine x 2)     Status: None (Preliminary result)   Collection Time: 04/03/23 12:16 PM   Specimen: BLOOD  Result Value Ref Range Status   Specimen Description BLOOD BLOOD  LEFT HAND  Final   Special Requests   Final    BOTTLES DRAWN AEROBIC AND ANAEROBIC Blood Culture results may not be optimal due to an inadequate volume of blood received in culture bottles   Culture   Final    NO GROWTH 2 DAYS Performed at [facility], [address] [place], [place] [address]    Report Status PENDING  Incomplete  Body fluid culture w Gram Stain     Status: None (Preliminary result)   Collection Time: 04/04/23 10:11 AM   Specimen: PATH Cytology Peritoneal fluid  Result Value Ref Range Status   Specimen Description   Final    PERITONEAL Performed at [facility], [address], [place], [place] [address]    Special Requests   Final    NONE Performed at [facility], [address] [place], [place] [address]    Gram Stain   Final    RARE WBC PRESENT, PREDOMINANTLY MONONUCLEAR NO  ORGANISMS SEEN    Culture   Final    NO GROWTH < 24 HOURS Performed at [facility], [address] [place], [place] [address]    Report Status PENDING  Incomplete    Radiology Studies: US Paracentesis  Result Date: 04/04/2023 INDICATION: 82 year old male with CHF exacerbation with recurrent ascites. Request received for diagnostic and therapeutic paracentesis. EXAM: ULTRASOUND GUIDED DIAGNOSTIC AND THERAPEUTIC LEFT LOWER QUADRANT PARACENTESIS MEDICATIONS: 20 mL 1% lidocaine COMPLICATIONS: None immediate. PROCEDURE: Informed written consent was obtained from the patient after a discussion of the risks, benefits and alternatives to treatment. A timeout was performed prior to the initiation of the procedure. Initial ultrasound scanning demonstrates a moderate amount of ascites within the left lower abdominal quadrant. The right lower abdomen was prepped and draped in the usual sterile fashion. 1% lidocaine was used for local anesthesia. Following this, a 19 gauge, 7-cm, Yueh catheter was introduced. An ultrasound image was saved for documentation purposes.  The paracentesis was performed. The catheter was removed and a dressing was applied. The patient tolerated the procedure well without immediate post procedural complication. FINDINGS: A total of approximately 6.6 L of clear, amber fluid was removed. Samples were sent to the laboratory as requested by the clinical team. IMPRESSION: Successful ultrasound-guided paracentesis yielding 6.6 liters of peritoneal fluid. Procedure performed by Alex Gardener, NP and supervised by Malachy Moan, MD Electronically Signed   By: Malachy Moan M.D.   On: 04/04/2023 12:51    Scheduled Meds:  sodium chloride flush  3 mL Intravenous Q12H   Continuous Infusions:  sodium chloride       LOS: 2 days    Time spent: 50 mins    Taylor Niece, MD [facility]   If 7PM-7AM, please contact night-coverage

## 2023-04-05 NOTE — Progress Notes (Signed)
[facility] CLINIC CARDIOLOGY CONSULT NOTE       Patient ID: Taylor [name] MRN: [id number] DOB/AGE: 05/25/1941 82 y.o.  Admit date: 04/03/2023 Referring Physician Dr. Huel Cote Primary Physician Dr. Sherrie Mustache Primary Cardiologist Dr. Darrold Junker Reason for Consultation AoCHF  HPI: Taylor [name] is an 82yoM with a PMH of PMH of chronic HFrEF (EF 20%, moderate MR 02/17/2023), CAD s/p CABG 08/2016, hx VF arrest s/p MI in 1994 s/p dual-chamber ICD (2014) and recent battery change out 02/13/2023, paroxysmal AF (Eliquis), CKD 4, AAA s/p EVAR (2008), COPD, DM2 who presented to [facility] ED 04/03/2023 with abdominal distention and shortness of breath over the past 4 days.  Cardiology is consulted for further assistance.  Interval History:  -Patient reports he feels significantly better today. Denies persistent SOB, CP. Saturating well on RA.  -Cr stable today, discussed CKD stage IV with patient and wife.   Review of systems complete and found to be negative unless listed above     Past Medical History:  Diagnosis Date   AAA (abdominal aortic aneurysm) (HCC) 06/03/2007   [facility]; Dr. Hart Rochester   AICD (automatic cardioverter/defibrillator) present    Arrhythmia    atrial fibrillation   Barrett's esophagus    Bladder cancer (HCC)    Bradycardia    CAD (coronary artery disease)    CAP (community acquired pneumonia) 11/13/2019   CHF (congestive heart failure) (HCC)    Cluster headache    COVID-19 09/27/2021   DDD (degenerative disc disease), lumbar    Diabetes mellitus without complication (HCC)    Dyspnea    WITH EXERTION   Edema    LEFT ANKLE   Fracture of skull base (HCC) 1997   due to fall   GERD (gastroesophageal reflux disease)    Gout    History of bladder cancer 12/1995   Hyperlipidemia    Hypertension    Hypocalcemia 04/19/2020   Possibly secondary to diuretics.   Low=5.9 04/19/2020   Malignant melanoma (HCC) 12/2012   right dorsal forearm excised   Myocardial  infarction [facility])    LAST 2014   Osteoarthritis of knee    Other specified complication of vascular prosthetic devices, implants and grafts, initial encounter (HCC) 08/22/2021   Pacemaker 10/10/2006   Pancreatitis 11/22/2019   Pneumonia    2016   Pre-diabetes    Psoriasis    Rib fracture 1997   due to fall   Sleep apnea    CPAP   Stroke [name])    Venous incompetence     Past Surgical History:  Procedure Laterality Date   ABDOMINAL AORTIC ANEURYSM REPAIR  06/03/2007   [facility]; Dr. Hart Rochester   ANGIOPLASTY  1994   MI   BLADDER TUMOR EXCISION  12/1995   CAROTID PTA/STENT INTERVENTION Left 07/23/2022   Procedure: CAROTID PTA/STENT INTERVENTION;  Surgeon: Renford Dills, MD;  Location: [facility] INVASIVE CV LAB;  Service: Cardiovascular;  Laterality: Left;   CATARACT EXTRACTION W/PHACO Left 10/22/2016   Procedure: CATARACT EXTRACTION PHACO AND INTRAOCULAR LENS PLACEMENT (IOC);  Surgeon: Galen Manila, MD;  Location: [facility] ORS;  Service: Ophthalmology;  Laterality: Left;  [place] 47.7 AP% 18.4 CDE 8.78 Fluid pack lot # [id number]   CATARACT EXTRACTION W/PHACO Right 12/10/2016   Procedure: CATARACT EXTRACTION PHACO AND INTRAOCULAR LENS PLACEMENT (IOC);  Surgeon: Galen Manila, MD;  Location: [facility] ORS;  Service: Ophthalmology;  Laterality: Right;  [place] 00:39 AP% 23.3 CDE 9.13 Fluid pack lot # [id number]   CORONARY ANGIOPLASTY  STENTS X 5   CORONARY ARTERY BYPASS GRAFT  09/22/2006   four   ELBOW BURSA SURGERY     DUE TO GOUT   INSERT / REPLACE / REMOVE PACEMAKER     MELANOMA EXCISION  12/2012   Right forearm   PPM GENERATOR CHANGEOUT N/A 02/13/2023   Procedure: PPM GENERATOR CHANGEOUT;  Surgeon: Marcina Millard, MD;  Location: [facility] INVASIVE CV LAB;  Service: Cardiovascular;  Laterality: N/A;    Medications Prior to Admission  Medication Sig Dispense Refill Last Dose   albuterol (VENTOLIN HFA) 108 (90 Base) MCG/ACT inhaler Inhale 2 puffs into the lungs every 6  (six) hours as needed for wheezing or shortness of breath. 8 g 2 unknown   allopurinol (ZYLOPRIM) 300 MG tablet Take 150 mg by mouth daily.   04/03/2023   apixaban (ELIQUIS) 5 MG TABS tablet Take 2.5 mg by mouth 2 (two) times daily.   04/03/2023   aspirin 81 MG EC tablet Take 81 mg by mouth daily.    04/03/2023   atorvastatin (LIPITOR) 40 MG tablet TAKE 1 TABLET(40 MG) BY MOUTH DAILY (Patient taking differently: Take 40 mg by mouth daily.) 90 tablet 1 04/03/2023   carvedilol (COREG) 6.25 MG tablet Take 1 tablet (6.25 mg total) by mouth 2 (two) times daily with a meal. 60 tablet 0 04/03/2023   empagliflozin (JARDIANCE) 10 MG TABS tablet Take 10 mg by mouth daily.   04/03/2023   ipratropium-albuterol (DUONEB) 0.5-2.5 (3) MG/3ML SOLN Inhale 3 mLs into the lungs every 4 (four) hours as needed (wheezing, shob).   unknown   isosorbide mononitrate (IMDUR) 30 MG 24 hr tablet TAKE 1/2 TABLET(15 MG) BY MOUTH DAILY (Patient taking differently: Take 15 mg by mouth daily.) 45 tablet 2 04/03/2023   Magnesium 200 MG TABS Take 1 tablet (200 mg total) by mouth in the morning and at bedtime. 60 tablet 5 04/03/2023   pantoprazole (PROTONIX) 40 MG tablet TAKE 1 TABLET BY MOUTH EVERY DAY 90 tablet 4 04/03/2023   potassium chloride SA (KLOR-CON M) 20 MEQ tablet Take 20 mEq by mouth daily.   04/03/2023   torsemide (DEMADEX) 20 MG tablet 60 mg (3 tablets) every morning and 40 mg (2 tablets) every afternoon/evening (Patient taking differently: Take 40 mg by mouth 2 (two) times daily.) 90 tablet 0 04/03/2023   triamcinolone ointment (KENALOG) 0.1 % APPLY TOPICALLY TWICE DAILY AS DIRECTED 454 g 3 unknown   clarithromycin (BIAXIN) 500 MG tablet Take 500 mg by mouth 2 (two) times daily. (Patient not taking: Reported on 04/03/2023)   Not Taking    Social History   Socioeconomic History   Marital status: Married    Spouse name: Taylor [name]   Number of children: 3   Years of education: Not on file   Highest education level: 12th grade  Occupational  History   Occupation: retired    Comment: previously worked as a Chief Financial Officer  Tobacco Use   Smoking status: Former    Packs/day: 1.00    Years: 30.00    Additional pack years: 0.00    Total pack years: 30.00    Types: Cigarettes    Quit date: 11/26/1999    Years since quitting: 23.3   Smokeless tobacco: Never  Vaping Use   Vaping Use: Never used  Substance and Sexual Activity   Alcohol use: No   Drug use: No   Sexual activity: Not on file  Other Topics Concern   Not on file  Social History Narrative  Lives at home with his family.  Independent at baseline.   Social Determinants of Health   Financial Resource Strain: Low Risk  (05/07/2022)   Overall Financial Resource Strain (CARDIA)    Difficulty of Paying Living Expenses: Not hard at all  Food Insecurity: No Food Insecurity (04/03/2023)   Hunger Vital Sign    Worried About Running Out of Food in the Last Year: Never true    Ran Out of Food in the Last Year: Never true  Transportation Needs: No Transportation Needs (04/03/2023)   PRAPARE - Administrator, Civil Service (Medical): No    Lack of Transportation (Non-Medical): No  Physical Activity: Insufficiently Active (04/18/2021)   Exercise Vital Sign    Days of Exercise per Week: 1 day    Minutes of Exercise per Session: 120 min  Stress: No Stress Concern Present (05/07/2022)   [employer] of Occupational Health - Occupational Stress Questionnaire    Feeling of Stress : Not at all  Social Connections: Moderately Isolated (05/07/2022)   Social Connection and Isolation Panel [NHANES]    Frequency of Communication with Friends and Family: Never    Frequency of Social Gatherings with Friends and Family: Once a week    Attends Religious Services: More than 4 times per year    Active Member of Clubs or Organizations: No    Attends Banker Meetings: Never    Marital Status: Married  Catering manager Violence: Not At Risk (04/03/2023)   Humiliation,  Afraid, Rape, and Kick questionnaire    Fear of Current or Ex-Partner: No    Emotionally Abused: No    Physically Abused: No    Sexually Abused: No    Family History  Problem Relation Age of Onset   Cancer Mother        Melanoma skin cancer   Heart attack Father 4   Cancer Father        throat cancer   Arthritis Brother       Intake/Output Summary (Last 24 hours) at 04/05/2023 1104 Last data filed at 04/05/2023 1037 Gross per 24 hour  Intake 120 ml  Output --  Net 120 ml    Vitals:   04/04/23 1648 04/04/23 2346 04/05/23 0306 04/05/23 0843  BP: 106/88 107/76 114/82 116/85  Pulse: 61 60 63 66  Resp: 18 18 20 18   Temp: 97.8 F (36.6 C) (!) 97.4 F (36.3 C) (!) 97.5 F (36.4 C)   TempSrc:      SpO2: 100% 99% 99% 100%  Weight:      Height:        PHYSICAL EXAM General: Comfortable appearing elderly Caucasian male, sitting upright on side of bed with wife present HEENT:  Normocephalic and atraumatic. Neck:  No JVD.  Chest: Left infraclavicular area with well-healed pulse generator pocket incision with resolving hematoma without evidence of infection, discharge, wound dehiscence Lungs: Mild crackles bilaterally without appreciable wheezes. Heart: HRRR . Normal S1 and S2, 3/6 holosystolic murmur best heard at the apex Abdomen: Non-distended, non-tender. Msk: Normal strength and tone for age. Extremities: Chronic hyperpigmentation with 2+ pitting edema bilaterally  neuro: Alert and oriented X 3. Psych:  Answers questions appropriately.   Labs: Basic Metabolic Panel: Recent Labs    04/03/23 1044 04/04/23 0445 04/05/23 0444  NA 135 134* 136  K 2.8* 3.6 3.8  CL 97* 101 103  CO2 27 25 26   GLUCOSE 108* 118* 92  BUN 80* 77* 82*  CREATININE 3.77* 3.66*  3.63*  CALCIUM 8.6* 8.5* 8.9  MG 2.5*  --  2.3  PHOS  --   --  5.4*   Liver Function Tests: Recent Labs    04/03/23 1044 04/05/23 0444  AST 32 29  ALT 11 11  ALKPHOS 144* 141*  BILITOT 2.5* 2.6*  PROT 7.2  6.6  ALBUMIN 2.9* 2.5*   No results for input(s): "LIPASE", "AMYLASE" in the last 72 hours. CBC: Recent Labs    04/03/23 1044 04/05/23 0444  WBC 3.8* 4.1  NEUTROABS 2.2  --   HGB 11.6* 11.9*  HCT 38.3* 39.4  MCV 100.5* 99.0  PLT 106* 101*   Cardiac Enzymes: Recent Labs    04/03/23 1044 04/04/23 0445  TROPONINIHS 35* 29*   BNP: Recent Labs    04/03/23 1103  BNP 1,650.0*   D-Dimer: No results for input(s): "DDIMER" in the last 72 hours. Hemoglobin A1C: No results for input(s): "HGBA1C" in the last 72 hours. Fasting Lipid Panel: No results for input(s): "CHOL", "HDL", "LDLCALC", "TRIG", "CHOLHDL", "LDLDIRECT" in the last 72 hours. Thyroid Function Tests: No results for input(s): "TSH", "T4TOTAL", "T3FREE", "THYROIDAB" in the last 72 hours.  Invalid input(s): "FREET3" Anemia Panel: No results for input(s): "VITAMINB12", "FOLATE", "FERRITIN", "TIBC", "IRON", "RETICCTPCT" in the last 72 hours.   Radiology: US Paracentesis  Result Date: 04/04/2023 INDICATION: 82 year old male with CHF exacerbation with recurrent ascites. Request received for diagnostic and therapeutic paracentesis. EXAM: ULTRASOUND GUIDED DIAGNOSTIC AND THERAPEUTIC LEFT LOWER QUADRANT PARACENTESIS MEDICATIONS: 20 mL 1% lidocaine COMPLICATIONS: None immediate. PROCEDURE: Informed written consent was obtained from the patient after a discussion of the risks, benefits and alternatives to treatment. A timeout was performed prior to the initiation of the procedure. Initial ultrasound scanning demonstrates a moderate amount of ascites within the left lower abdominal quadrant. The right lower abdomen was prepped and draped in the usual sterile fashion. 1% lidocaine was used for local anesthesia. Following this, a 19 gauge, 7-cm, Yueh catheter was introduced. An ultrasound image was saved for documentation purposes. The paracentesis was performed. The catheter was removed and a dressing was applied. The patient tolerated  the procedure well without immediate post procedural complication. FINDINGS: A total of approximately 6.6 L of clear, amber fluid was removed. Samples were sent to the laboratory as requested by the clinical team. IMPRESSION: Successful ultrasound-guided paracentesis yielding 6.6 liters of peritoneal fluid. Procedure performed by Alex Gardener, NP and supervised by Malachy Moan, MD Electronically Signed   By: Malachy Moan M.D.   On: 04/04/2023 12:51   CT ABDOMEN PELVIS WO CONTRAST  Result Date: 04/03/2023 CLINICAL DATA:  Abdominal pain EXAM: CT ABDOMEN AND PELVIS WITHOUT CONTRAST TECHNIQUE: Multidetector CT imaging of the abdomen and pelvis was performed following the standard protocol without IV contrast. RADIATION DOSE REDUCTION: This exam was performed according to the departmental dose-optimization program which includes automated exposure control, adjustment of the mA and/or kV according to patient size and/or use of iterative reconstruction technique. COMPARISON:  CT 02/25/2023 FINDINGS: Lower chest: Enlarged heart. Status post median sternotomy. Defibrillator leads along the right side of the heart. Persistent small to moderate right pleural effusion. There is some bilateral areas of pleural thickening and some calcification. There is some bandlike changes along the lung bases once again. Areas of interstitial septal thickening. Hepatobiliary: Nodular contours of the liver. No obvious lesion on noncontrast imaging. Gallbladder is nondilated. Pancreas: Moderate atrophy of the pancreas, unchanged. Spleen: Spleen is nonenlarged. Adrenals/Urinary Tract: Adrenal glands are preserved. Severe atrophy of the  left kidney. No abnormal calcification seen within either kidney nor along the course of either ureter. Preserved contours of the urinary bladder. 7 mm hyperdense lesion along the upper pole of the right kidney, unchanged from previous. Bosniak 2 lesion. No specific follow-up. Stomach/Bowel: Stomach is  distended with fluid. The large bowel has a normal course and caliber with scattered stool. Left-sided colonic diverticula. Small bowel is nondilated. Third portion duodenal diverticulum. Vascular/Lymphatic: Normal caliber IVC. Aortic endograft in place once again, incompletely evaluated on this noncontrast examination. Scattered vascular calcifications otherwise. There are some small nodes identified in the retroperitoneum and pelvis which are not pathologic by size criteria and unchanged from previous. Reproductive: Heterogeneous prostate. Other: Moderate ascites once again identified, similar to previous. Anasarca. There is some fluid along the right inguinal canal. Musculoskeletal: Moderate degenerative changes of the spine and pelvis. Trace retrolisthesis of L2 on L3 and L1 on L2. Multilevel stenosis. IMPRESSION: Overall appearance is similar to previous. Moderate ascites once again identified with stranding and anasarca. Nodular liver. No bowel obstruction, free air.  Colonic diverticula. Aortic endograft incompletely evaluated without contrast. Severe atrophy of the left kidney. Enlarged heart. Postop chest with defibrillator. Stable moderate right pleural effusion. Bilateral pleural thickening with calcification. Please correlate for any history of the environmental exposure. Electronically Signed   By: Karen Kays M.D.   On: 04/03/2023 12:31   DG Chest Portable 1 View  Result Date: 04/03/2023 CLINICAL DATA:  Shortness of breath EXAM: PORTABLE CHEST 1 VIEW COMPARISON:  CXR 02/25/23 FINDINGS: Left-sided dual lead cardiac device with unchanged lead positioning. Status post median sternotomy and CABG. Unchanged enlarged cardiac contours. Small right and likely a layering left-sided pleural effusion. There are hazy bibasilar airspace opacity which could represent atelectasis or infection. There are prominent bilateral interstitial opacities, favored to represent pulmonary edema. No radiographically apparent  displaced rib fractures IMPRESSION: 1. Cardiomegaly, mild to moderate pulmonary edema, and small bilateral pleural effusions. 2. Hazy bibasilar airspace opacities could represent atelectasis or infection. Electronically Signed   By: Lorenza Cambridge M.D.   On: 04/03/2023 11:36    Telemetry reviewed by me [facility]) 04/05/23: V paced rate 60s  EKG reviewed by me: V paced rate 59  Data reviewed by me [facility]) 04/05/2023: Nursing notes, IR note, hospitalist progress note last 24h vitals tele labs imaging I/O   Principal Problem:   Acute hypoxic respiratory failure (HCC) Active Problems:   Essential (primary) hypertension   Atrial fibrillation (HCC)   Atherosclerotic heart disease of native coronary artery with unspecified angina pectoris (HCC)   Type 2 diabetes mellitus with diabetic chronic kidney disease (HCC)   Acute on chronic systolic congestive heart failure (HCC)   Acute kidney injury superimposed on chronic kidney disease (HCC)   Chronic obstructive pulmonary disease, unspecified COPD type (HCC)   Ascites    ASSESSMENT AND PLAN:  Jaevin Canal. Banken is an 60yoM with a PMH of PMH of chronic HFrEF (EF 20%, moderate MR 02/17/2023), CAD s/p CABG 08/2016, hx VF arrest s/p MI in 1994 s/p dual-chamber ICD (2014) and recent battery change out 02/13/2023, paroxysmal AF (Eliquis), CKD 4, AAA s/p EVAR (2008), COPD, DM2 who presented to [facility] ED 04/03/2023 with abdominal distention and shortness of breath over the past 4 days.  Cardiology is consulted for further assistance.  # Acute on chronic HFrEF # Ascites # Moderate MR The patient has had multiple changes on an outpatient basis with his diuretic dosing presumably due to worsening of his renal function, with  some inconsistency/confusion by the patient and his wife with how much torsemide he was taking recently, if any at all.  On exam he remains uncomfortable appearing with significant clinical evidence of volume overload with tense ascites.  Currently awaiting  paracentesis, which he required during his last heart failure decompensation approximately 1 month ago.   -Greatly appreciate IR assistance with paracentesis, 6.6L removed yesterday -S/p IV Lasix 60 mg IV x 1 yesterday, will give another 60 mg IV today for further diuresis -Anticipate need for daily diuretics at discharge with clear instructions on when/how much to take  -Monitor and replenish electrolytes for a goal K >4, mag >2 -Further GDMT with BB limited by blood pressure, addition of SGLT2i, ARB/ARNI, MRA limited by renal dysfunction at present  # CKD 4 Current BUN/creatinine 82/3.63 and GFR of 16. On 4/29 his creatinine was 2.9 and GFR was 21. -Monitor closely with diuresis. Thomes Dinning with nephrology outpatient  # Paroxysmal atrial fibrillation # Hx VF arrest s/p dual-chamber ICD Rate controlled on telemetry, anticoagulated with Eliquis 2.5 mg twice daily.  # CAD s/p CABG  # Demand ischemia Chest pain-free.  Initial troponin elevated at 35, which is likely demand in the setting of acute on chronic CHF and chronically elevated with renal insufficiency and not ACS.  Anticipate the patient will be stable for discharge from a cardiac standpoint tomorrow pending kidney function on AM labs.    This patient's plan of care was discussed and created with Dr. Darrold Junker and he is in agreement.  Signed: Gale Journey , PA-C 04/05/2023, 11:04 AM [facility] Cardiology

## 2023-04-06 LAB — BASIC METABOLIC PANEL
Anion gap: 10 (ref 5–15)
BUN: 79 mg/dL — ABNORMAL HIGH (ref 8–23)
CO2: 26 mmol/L (ref 22–32)
Calcium: 8.7 mg/dL — ABNORMAL LOW (ref 8.9–10.3)
Chloride: 101 mmol/L (ref 98–111)
Creatinine, Ser: 3.29 mg/dL — ABNORMAL HIGH (ref 0.61–1.24)
GFR, Estimated: 18 mL/min — ABNORMAL LOW (ref >60)
Glucose, Bld: 104 mg/dL — ABNORMAL HIGH (ref 70–99)
Potassium: 3.8 mmol/L (ref 3.5–5.1)
Sodium: 137 mmol/L (ref 135–145)

## 2023-04-06 LAB — BODY FLUID CULTURE W GRAM STAIN

## 2023-04-06 LAB — CULTURE, BLOOD (ROUTINE X 2)

## 2023-04-06 NOTE — Discharge Instructions (Signed)
Advised to follow-up with primary care physician in 1 week. Advised to follow-up with cardiology Dr. Darrold Junker is 1 week. Advised to take torsemide 40 mg every 12 hours. Advised to continue Coreg 6.25 mg twice daily for CHF.

## 2023-04-06 NOTE — Progress Notes (Addendum)
[facility] CLINIC CARDIOLOGY CONSULT NOTE       Patient ID: Taylor [name] MRN: [id number] DOB/AGE: Nov 06, 1941 82 y.o.  Admit date: 04/03/2023 Referring Physician Dr. Huel Cote Primary Physician Dr. Sherrie Mustache Primary Cardiologist Dr. Darrold Junker Reason for Consultation AoCHF  HPI: [name]. [name] is an 39yoM with a PMH of PMH of chronic HFrEF (EF 20%, moderate MR 02/17/2023), CAD s/p CABG 08/2016, hx VF arrest s/p MI in 1994 s/p dual-chamber ICD (2014) and recent battery change out 02/13/2023, paroxysmal AF (Eliquis), CKD 4, AAA s/p EVAR (2008), COPD, DM2 who presented to [facility] ED 04/03/2023 with abdominal distention and shortness of breath over the past 4 days.  Cardiology is consulted for further assistance.  Interval History:  -Patient continues to feel well. Denies persistent SOB, CP.  -Tolerated IV Lasix yesterday well, Cr improved to 3.29. Remains on RA.   Review of systems complete and found to be negative unless listed above     Past Medical History:  Diagnosis Date   AAA (abdominal aortic aneurysm) (HCC) 06/03/2007   [facility]; Dr. Hart Rochester   AICD (automatic cardioverter/defibrillator) present    Arrhythmia    atrial fibrillation   Barrett's esophagus    Bladder cancer (HCC)    Bradycardia    CAD (coronary artery disease)    CAP (community acquired pneumonia) 11/13/2019   CHF (congestive heart failure) (HCC)    Cluster headache    COVID-19 09/27/2021   DDD (degenerative disc disease), lumbar    Diabetes mellitus without complication (HCC)    Dyspnea    WITH EXERTION   Edema    LEFT ANKLE   Fracture of skull base (HCC) 1997   due to fall   GERD (gastroesophageal reflux disease)    Gout    History of bladder cancer 12/1995   Hyperlipidemia    Hypertension    Hypocalcemia 04/19/2020   Possibly secondary to diuretics.   Low=5.9 04/19/2020   Malignant melanoma (HCC) 12/2012   right dorsal forearm excised   Myocardial infarction [facility])    LAST 2014    Osteoarthritis of knee    Other specified complication of vascular prosthetic devices, implants and grafts, initial encounter (HCC) 08/22/2021   Pacemaker 10/10/2006   Pancreatitis 11/22/2019   Pneumonia    2016   Pre-diabetes    Psoriasis    Rib fracture 1997   due to fall   Sleep apnea    CPAP   Stroke [facility])    Venous incompetence     Past Surgical History:  Procedure Laterality Date   ABDOMINAL AORTIC ANEURYSM REPAIR  06/03/2007   [facility]; Dr. Hart Rochester   ANGIOPLASTY  1994   MI   BLADDER TUMOR EXCISION  12/1995   CAROTID PTA/STENT INTERVENTION Left 07/23/2022   Procedure: CAROTID PTA/STENT INTERVENTION;  Surgeon: Renford Dills, MD;  Location: [facility] INVASIVE CV LAB;  Service: Cardiovascular;  Laterality: Left;   CATARACT EXTRACTION W/PHACO Left 10/22/2016   Procedure: CATARACT EXTRACTION PHACO AND INTRAOCULAR LENS PLACEMENT (IOC);  Surgeon: Galen Manila, MD;  Location: [facility] ORS;  Service: Ophthalmology;  Laterality: Left;  [place] 47.7 AP% 18.4 CDE 8.78 Fluid pack lot # [id number]   CATARACT EXTRACTION W/PHACO Right 12/10/2016   Procedure: CATARACT EXTRACTION PHACO AND INTRAOCULAR LENS PLACEMENT (IOC);  Surgeon: Galen Manila, MD;  Location: [facility] ORS;  Service: Ophthalmology;  Laterality: Right;  [place] 00:39 AP% 23.3 CDE 9.13 Fluid pack lot # [id number]   CORONARY ANGIOPLASTY     STENTS X  5   CORONARY ARTERY BYPASS GRAFT  09/22/2006   four   ELBOW BURSA SURGERY     DUE TO GOUT   INSERT / REPLACE / REMOVE PACEMAKER     MELANOMA EXCISION  12/2012   Right forearm   PPM GENERATOR CHANGEOUT N/A 02/13/2023   Procedure: PPM GENERATOR CHANGEOUT;  Surgeon: Marcina Millard, MD;  Location: [facility] INVASIVE CV LAB;  Service: Cardiovascular;  Laterality: N/A;    Medications Prior to Admission  Medication Sig Dispense Refill Last Dose   albuterol (VENTOLIN HFA) 108 (90 Base) MCG/ACT inhaler Inhale 2 puffs into the lungs every 6 (six) hours as needed for wheezing  or shortness of breath. 8 g 2 unknown   allopurinol (ZYLOPRIM) 300 MG tablet Take 150 mg by mouth daily.   04/03/2023   apixaban (ELIQUIS) 5 MG TABS tablet Take 2.5 mg by mouth 2 (two) times daily.   04/03/2023   aspirin 81 MG EC tablet Take 81 mg by mouth daily.    04/03/2023   atorvastatin (LIPITOR) 40 MG tablet TAKE 1 TABLET(40 MG) BY MOUTH DAILY (Patient taking differently: Take 40 mg by mouth daily.) 90 tablet 1 04/03/2023   carvedilol (COREG) 6.25 MG tablet Take 1 tablet (6.25 mg total) by mouth 2 (two) times daily with a meal. 60 tablet 0 04/03/2023   empagliflozin (JARDIANCE) 10 MG TABS tablet Take 10 mg by mouth daily.   04/03/2023   ipratropium-albuterol (DUONEB) 0.5-2.5 (3) MG/3ML SOLN Inhale 3 mLs into the lungs every 4 (four) hours as needed (wheezing, shob).   unknown   isosorbide mononitrate (IMDUR) 30 MG 24 hr tablet TAKE 1/2 TABLET(15 MG) BY MOUTH DAILY (Patient taking differently: Take 15 mg by mouth daily.) 45 tablet 2 04/03/2023   Magnesium 200 MG TABS Take 1 tablet (200 mg total) by mouth in the morning and at bedtime. 60 tablet 5 04/03/2023   pantoprazole (PROTONIX) 40 MG tablet TAKE 1 TABLET BY MOUTH EVERY DAY 90 tablet 4 04/03/2023   potassium chloride SA (KLOR-CON M) 20 MEQ tablet Take 20 mEq by mouth daily.   04/03/2023   torsemide (DEMADEX) 20 MG tablet 60 mg (3 tablets) every morning and 40 mg (2 tablets) every afternoon/evening (Patient taking differently: Take 40 mg by mouth 2 (two) times daily.) 90 tablet 0 04/03/2023   triamcinolone ointment (KENALOG) 0.1 % APPLY TOPICALLY TWICE DAILY AS DIRECTED 454 g 3 unknown   clarithromycin (BIAXIN) 500 MG tablet Take 500 mg by mouth 2 (two) times daily. (Patient not taking: Reported on 04/03/2023)   Not Taking    Social History   Socioeconomic History   Marital status: Married    Spouse name: [name]   Number of children: 3   Years of education: Not on file   Highest education level: 12th grade  Occupational History   Occupation: retired     Comment: previously worked as a Chief Financial Officer  Tobacco Use   Smoking status: Former    Packs/day: 1.00    Years: 30.00    Additional pack years: 0.00    Total pack years: 30.00    Types: Cigarettes    Quit date: 11/26/1999    Years since quitting: 23.3   Smokeless tobacco: Never  Vaping Use   Vaping Use: Never used  Substance and Sexual Activity   Alcohol use: No   Drug use: No   Sexual activity: Not on file  Other Topics Concern   Not on file  Social History Narrative   Lives at  home with his family.  Independent at baseline.   Social Determinants of Health   Financial Resource Strain: Low Risk  (05/07/2022)   Overall Financial Resource Strain (CARDIA)    Difficulty of Paying Living Expenses: Not hard at all  Food Insecurity: No Food Insecurity (04/03/2023)   Hunger Vital Sign    Worried About Running Out of Food in the Last Year: Never true    Ran Out of Food in the Last Year: Never true  Transportation Needs: No Transportation Needs (04/03/2023)   PRAPARE - Administrator, Civil Service (Medical): No    Lack of Transportation (Non-Medical): No  Physical Activity: Insufficiently Active (04/18/2021)   Exercise Vital Sign    Days of Exercise per Week: 1 day    Minutes of Exercise per Session: 120 min  Stress: No Stress Concern Present (05/07/2022)   [employer] of Occupational Health - Occupational Stress Questionnaire    Feeling of Stress : Not at all  Social Connections: Moderately Isolated (05/07/2022)   Social Connection and Isolation Panel [NHANES]    Frequency of Communication with Friends and Family: Never    Frequency of Social Gatherings with Friends and Family: Once a week    Attends Religious Services: More than 4 times per year    Active Member of Clubs or Organizations: No    Attends Banker Meetings: Never    Marital Status: Married  Catering manager Violence: Not At Risk (04/03/2023)   Humiliation, Afraid, Rape, and Kick questionnaire     Fear of Current or Ex-Partner: No    Emotionally Abused: No    Physically Abused: No    Sexually Abused: No    Family History  Problem Relation Age of Onset   Cancer Mother        Melanoma skin cancer   Heart attack Father 21   Cancer Father        throat cancer   Arthritis Brother       Intake/Output Summary (Last 24 hours) at 04/06/2023 0951 Last data filed at 04/06/2023 0618 Gross per 24 hour  Intake 600 ml  Output 1100 ml  Net -500 ml    Vitals:   04/05/23 1611 04/05/23 2000 04/06/23 0400 04/06/23 0800  BP: 104/71 114/87 118/82 116/82  Pulse: 62 61 61 62  Resp: 18 20 18 17   Temp: 97.6 F (36.4 C) 97.8 F (36.6 C) 97.6 F (36.4 C) 97.7 F (36.5 C)  TempSrc: Oral Oral Axillary   SpO2: 99% 98% 100% 98%  Weight:      Height:        PHYSICAL EXAM General: Comfortable appearing elderly Caucasian male, sitting upright in chair with no family present HEENT:  Normocephalic and atraumatic. Neck:  No JVD.  Chest: Left infraclavicular area with well-healed pulse generator pocket incision with resolving hematoma without evidence of infection, discharge, wound dehiscence Lungs: Mild crackles R lung base without appreciable wheezes. Heart: HRRR . Normal S1 and S2, 3/6 holosystolic murmur best heard at the apex Abdomen: Non-distended, non-tender. Msk: Normal strength and tone for age. Extremities: Chronic hyperpigmentation with 1+ pitting edema bilaterally  neuro: Alert and oriented X 3. Psych:  Answers questions appropriately.   Labs: Basic Metabolic Panel: Recent Labs    04/03/23 1044 04/04/23 0445 04/05/23 0444 04/06/23 0510  NA 135   < > 136 137  K 2.8*   < > 3.8 3.8  CL 97*   < > 103 101  CO2 27   < >  26 26  GLUCOSE 108*   < > 92 104*  BUN 80*   < > 82* 79*  CREATININE 3.77*   < > 3.63* 3.29*  CALCIUM 8.6*   < > 8.9 8.7*  MG 2.5*  --  2.3  --   PHOS  --   --  5.4*  --    < > = values in this interval not displayed.   Liver Function Tests: Recent Labs     04/03/23 1044 04/05/23 0444  AST 32 29  ALT 11 11  ALKPHOS 144* 141*  BILITOT 2.5* 2.6*  PROT 7.2 6.6  ALBUMIN 2.9* 2.5*   No results for input(s): "LIPASE", "AMYLASE" in the last 72 hours. CBC: Recent Labs    04/03/23 1044 04/05/23 0444  WBC 3.8* 4.1  NEUTROABS 2.2  --   HGB 11.6* 11.9*  HCT 38.3* 39.4  MCV 100.5* 99.0  PLT 106* 101*   Cardiac Enzymes: Recent Labs    04/03/23 1044 04/04/23 0445  TROPONINIHS 35* 29*   BNP: Recent Labs    04/03/23 1103  BNP 1,650.0*   D-Dimer: No results for input(s): "DDIMER" in the last 72 hours. Hemoglobin A1C: No results for input(s): "HGBA1C" in the last 72 hours. Fasting Lipid Panel: No results for input(s): "CHOL", "HDL", "LDLCALC", "TRIG", "CHOLHDL", "LDLDIRECT" in the last 72 hours. Thyroid Function Tests: No results for input(s): "TSH", "T4TOTAL", "T3FREE", "THYROIDAB" in the last 72 hours.  Invalid input(s): "FREET3" Anemia Panel: No results for input(s): "VITAMINB12", "FOLATE", "FERRITIN", "TIBC", "IRON", "RETICCTPCT" in the last 72 hours.   Radiology: US Paracentesis  Result Date: 04/04/2023 INDICATION: 82 year old male with CHF exacerbation with recurrent ascites. Request received for diagnostic and therapeutic paracentesis. EXAM: ULTRASOUND GUIDED DIAGNOSTIC AND THERAPEUTIC LEFT LOWER QUADRANT PARACENTESIS MEDICATIONS: 20 mL 1% lidocaine COMPLICATIONS: None immediate. PROCEDURE: Informed written consent was obtained from the patient after a discussion of the risks, benefits and alternatives to treatment. A timeout was performed prior to the initiation of the procedure. Initial ultrasound scanning demonstrates a moderate amount of ascites within the left lower abdominal quadrant. The right lower abdomen was prepped and draped in the usual sterile fashion. 1% lidocaine was used for local anesthesia. Following this, a 19 gauge, 7-cm, Yueh catheter was introduced. An ultrasound image was saved for documentation  purposes. The paracentesis was performed. The catheter was removed and a dressing was applied. The patient tolerated the procedure well without immediate post procedural complication. FINDINGS: A total of approximately 6.6 L of clear, amber fluid was removed. Samples were sent to the laboratory as requested by the clinical team. IMPRESSION: Successful ultrasound-guided paracentesis yielding 6.6 liters of peritoneal fluid. Procedure performed by Alex Gardener, NP and supervised by Malachy Moan, MD Electronically Signed   By: Malachy Moan M.D.   On: 04/04/2023 12:51   CT ABDOMEN PELVIS WO CONTRAST  Result Date: 04/03/2023 CLINICAL DATA:  Abdominal pain EXAM: CT ABDOMEN AND PELVIS WITHOUT CONTRAST TECHNIQUE: Multidetector CT imaging of the abdomen and pelvis was performed following the standard protocol without IV contrast. RADIATION DOSE REDUCTION: This exam was performed according to the departmental dose-optimization program which includes automated exposure control, adjustment of the mA and/or kV according to patient size and/or use of iterative reconstruction technique. COMPARISON:  CT 02/25/2023 FINDINGS: Lower chest: Enlarged heart. Status post median sternotomy. Defibrillator leads along the right side of the heart. Persistent small to moderate right pleural effusion. There is some bilateral areas of pleural thickening and some calcification. There is some  bandlike changes along the lung bases once again. Areas of interstitial septal thickening. Hepatobiliary: Nodular contours of the liver. No obvious lesion on noncontrast imaging. Gallbladder is nondilated. Pancreas: Moderate atrophy of the pancreas, unchanged. Spleen: Spleen is nonenlarged. Adrenals/Urinary Tract: Adrenal glands are preserved. Severe atrophy of the left kidney. No abnormal calcification seen within either kidney nor along the course of either ureter. Preserved contours of the urinary bladder. 7 mm hyperdense lesion along the upper  pole of the right kidney, unchanged from previous. Bosniak 2 lesion. No specific follow-up. Stomach/Bowel: Stomach is distended with fluid. The large bowel has a normal course and caliber with scattered stool. Left-sided colonic diverticula. Small bowel is nondilated. Third portion duodenal diverticulum. Vascular/Lymphatic: Normal caliber IVC. Aortic endograft in place once again, incompletely evaluated on this noncontrast examination. Scattered vascular calcifications otherwise. There are some small nodes identified in the retroperitoneum and pelvis which are not pathologic by size criteria and unchanged from previous. Reproductive: Heterogeneous prostate. Other: Moderate ascites once again identified, similar to previous. Anasarca. There is some fluid along the right inguinal canal. Musculoskeletal: Moderate degenerative changes of the spine and pelvis. Trace retrolisthesis of L2 on L3 and L1 on L2. Multilevel stenosis. IMPRESSION: Overall appearance is similar to previous. Moderate ascites once again identified with stranding and anasarca. Nodular liver. No bowel obstruction, free air.  Colonic diverticula. Aortic endograft incompletely evaluated without contrast. Severe atrophy of the left kidney. Enlarged heart. Postop chest with defibrillator. Stable moderate right pleural effusion. Bilateral pleural thickening with calcification. Please correlate for any history of the environmental exposure. Electronically Signed   By: Karen Kays M.D.   On: 04/03/2023 12:31   DG Chest Portable 1 View  Result Date: 04/03/2023 CLINICAL DATA:  Shortness of breath EXAM: PORTABLE CHEST 1 VIEW COMPARISON:  CXR 02/25/23 FINDINGS: Left-sided dual lead cardiac device with unchanged lead positioning. Status post median sternotomy and CABG. Unchanged enlarged cardiac contours. Small right and likely a layering left-sided pleural effusion. There are hazy bibasilar airspace opacity which could represent atelectasis or infection. There  are prominent bilateral interstitial opacities, favored to represent pulmonary edema. No radiographically apparent displaced rib fractures IMPRESSION: 1. Cardiomegaly, mild to moderate pulmonary edema, and small bilateral pleural effusions. 2. Hazy bibasilar airspace opacities could represent atelectasis or infection. Electronically Signed   By: Lorenza Cambridge M.D.   On: 04/03/2023 11:36    Telemetry reviewed by me [facility]) 04/06/23: Patient not on tele this AM, reviewed overnight data  EKG reviewed by me: V paced rate 59  Data reviewed by me [facility]) 04/06/2023: Nursing notes, IR note, hospitalist progress note last 24h vitals tele labs imaging I/O   Principal Problem:   Acute hypoxic respiratory failure (HCC) Active Problems:   Essential (primary) hypertension   Atrial fibrillation (HCC)   Atherosclerotic heart disease of native coronary artery with unspecified angina pectoris (HCC)   Type 2 diabetes mellitus with diabetic chronic kidney disease (HCC)   Acute on chronic systolic congestive heart failure (HCC)   Acute kidney injury superimposed on chronic kidney disease (HCC)   Chronic obstructive pulmonary disease, unspecified COPD type (HCC)   Ascites    ASSESSMENT AND PLAN:  Jimbo Groebner. Seitz is an 60yoM with a PMH of PMH of chronic HFrEF (EF 20%, moderate MR 02/17/2023), CAD s/p CABG 08/2016, hx VF arrest s/p MI in 1994 s/p dual-chamber ICD (2014) and recent battery change out 02/13/2023, paroxysmal AF (Eliquis), CKD 4, AAA s/p EVAR (2008), COPD, DM2 who presented to [facility] ED  04/03/2023 with abdominal distention and shortness of breath over the past 4 days.  Cardiology is consulted for further assistance.  # Acute on chronic HFrEF # Ascites # Moderate MR The patient has had multiple changes on an outpatient basis with his diuretic dosing presumably due to worsening of his renal function, with some inconsistency/confusion by the patient and his wife with how much torsemide he was taking recently, if  any at all. Clinically improved after paracentesis and IV diuresis. -Greatly appreciate IR assistance with paracentesis, 6.6L removed on 5/10 -S/p IV Lasix 60 mg IV x 1 5/10 and 5/11 -Restart home Torsemide 40 mg bid. Patient will need close follow up to determine response to dose.  -Monitor and replenish electrolytes for a goal K >4, mag >2 -Restart home Carvedilol 6.25 mg twice daily. Further GDMT with SGLT2i, ARB/ARNI, MRA limited by renal dysfunction at present.  # CKD 4 Current BUN/creatinine 79/3.29 and GFR of 18. On 4/29 his creatinine was 2.9 and GFR was 21. -Monitor closely with diuresis. Thomes Dinning with nephrology outpatient  # Paroxysmal atrial fibrillation # Hx VF arrest s/p dual-chamber ICD Rate controlled on telemetry, anticoagulated with Eliquis 2.5 mg twice daily.  # CAD s/p CABG  # Demand ischemia Chest pain-free.  Initial troponin elevated at 35, which is likely demand in the setting of acute on chronic CHF and chronically elevated with renal insufficiency and not ACS.   Patient stable for discharge today from a cardiac perspective.    This patient's plan of care was discussed and created with Dr. Darrold Junker and he is in agreement.  Signed: Gale Journey , PA-C 04/06/2023, 9:51 AM [facility] Cardiology

## 2023-04-06 NOTE — TOC Transition Note (Signed)
Transition of Care [facility]) - CM/SW Discharge Note   Patient Details  Name: Taylor [name] MRN: [id number] Date of Birth: Apr 13, 1941  [facility] CM/SW Contact:  Bing Quarry, RN Phone Number: 04/06/2023, 2:26 PM   Clinical Narrative: 5/12: Discharged today. HH orders in place but per prior CM notes, patient is attending cardiac rehab outpatient starting 04/10/23 and will defer decision on [facility] after that and are aware to reach out to PCP for additional [facility] services or inquire at cardiac rehab about [facility] referral is needed after that visit. A list of outpatient therapy providers was provided to them on 04/04/23 by prior CM via progress note. Updated provider regarding this. Gabriel Cirri RN CM        Barriers to Discharge: Continued Medical Work up   Patient Goals and CMS Choice CMS Medicare.gov Compare Post Acute Care list provided to:: Patient Choice offered to / list presented to : Patient  Discharge Placement                         Discharge Plan and Services Additional resources added to the After Visit Summary for                                       Social Determinants of Health (SDOH) Interventions SDOH Screenings   Food Insecurity: No Food Insecurity (04/03/2023)  Housing: Low Risk  (04/03/2023)  Transportation Needs: No Transportation Needs (04/03/2023)  Utilities: Not At Risk (04/03/2023)  Alcohol Screen: Low Risk  (01/03/2023)  Depression (PHQ2-9): High Risk (01/03/2023)  Financial Resource Strain: Low Risk  (05/07/2022)  Physical Activity: Insufficiently Active (04/18/2021)  Social Connections: Moderately Isolated (05/07/2022)  Stress: No Stress Concern Present (05/07/2022)  Tobacco Use: Medium Risk (04/03/2023)     Readmission Risk Interventions     No data to display

## 2023-04-06 NOTE — Discharge Summary (Signed)
Physician Discharge Summary  JERIMI WRITE ZOX:[id number] DOB: 11-Dec-1940 DOA: 04/03/2023  PCP: Malva Limes, MD  Admit date: 04/03/2023  Discharge date: 04/06/2023  Admitted From: Home  Disposition:  Home Health Services  Recommendations for Outpatient Follow-up:  Follow up with PCP in 1-2 weeks. Please obtain BMP/CBC in one week Advised to follow-up with Cardiology Dr. Darrold Junker is 1 week. Advised to take torsemide 40 mg every 12 hours. Advised to continue Coreg 6.25 mg twice daily for CHF.  Home Health: None Equipment/Devices: None  Discharge Condition: Stable CODE STATUS:Full code Diet recommendation: Heart Healthy  Brief [facility] Course: This 82 yrs old male with PMH significant of decompensated Liver cirrhosis, HFrEF with last EF of less than 20% s/p AICD, CAD s/p DES, CKD stage IV, psoriasis, CVA, hypertension, restrictive lung disease, atrial fibrillation on Eliquis, type 2 diabetes, who presents to the ED due to shortness of breath. Patient reports worsening lower extremity swelling, abdominal distention, shortness of breath and productive cough for last few days.  He reports he found relief only with hot showers that will help him cough up his mucus. His abdominal pain radiates down into his right groin, He reports that his cardiologist has told him to stop torsemide. Since then he has not taken any diuretics. On arrival in the ED He was hypertensive and hypoxic requiring 4 L of supplemental oxygen. Serum creatinine 3.77, troponin elevated at 35,  BNP 1650.  Chest x-ray shows cardiomegaly with mild to moderate pulmonary edema and bilateral pleural effusion.  CT abdomen shows moderate ascites with anasarca, nodular liver and severe atrophy of left kidney.  Patient was admitted for further evaluation.  Cardiology and interventional radiology consulted.  Patient underwent ultrasound-guided paracentesis 6.6 L of clear fluid drained.  Patient was continued on IV Lasix.   Cardiology evaluation completed recommended to continue with IV diuresis.  Patient feels better renal function slightly improved and he wants to be discharged.  Cardiology signed off.  Patient is being discharged home.  Discharge Diagnoses:  Principal Problem:   Acute hypoxic respiratory failure (HCC) Active Problems:   Acute on chronic systolic congestive heart failure (HCC)   Ascites   Acute kidney injury superimposed on chronic kidney disease (HCC)   Essential (primary) hypertension   Atrial fibrillation (HCC)   Atherosclerotic heart disease of native coronary artery with unspecified angina pectoris (HCC)   Type 2 diabetes mellitus with diabetic chronic kidney disease (HCC)   Chronic obstructive pulmonary disease, unspecified COPD type (HCC)  Acute hypoxic respiratory failure: Acute on chronic HFrEF: Patient presented with worsening shortness of breath, abdominal distention, bilateral leg swelling likely due to not taking torsemide due to worsening of his renal functions. Continue supplemental oxygen to maintain oxygen saturation above 88% Patient underwent ultrasound guided paracentesis.  6.6 L of clear fluid drained. Patient reports feeling much relief. Recent LVEF less than 20%.  BNP markedly elevated 1650. S/p IV Lasix 60 mg x 1 5/10 and 5/11 Continue GDMT. Monitor daily weight, intake output charting. Cardiology is consulted.  Continue IV diuresis. Patient is being discharged on torsemide 40 mg every 12 hours.   Decompensated liver cirrhosis with ascites: Patient underwent ultrasound-guided paracentesis.   6.6 L of clear fluid drained. He feels much improved.   AKI on CKD stage IV: Likely in the setting of cardiorenal syndrome. Prior serum creatinine less than 2. Creatinine on admission 3.7.  Likely due to acute on chronic CHF. Serum creatinine is improving. 3.77 > 3.66 >3.63 Monitor serum  creatinine while on lasix.   COPD: COPD has not been officially diagnosed, only  clinically suspected in the past. Continue DuoNeb as needed.   Type 2 diabetes with CKD: Hold p.o. diabetic medications. SSI, sensitive   Coronary artery disease: CAD s/p DES.  No chest pain at this time.   Mildly elevated troponins likely due to CKD and CHF exacerbation. Continue home aspirin and statin.   Paroxysmal Atrial fibrillation [facility]): EKG with ventricular pacing.  Telemetry shows normal sinus rhythm. Heart rate is well-controlled.  Continue home Eliquis   Essential hypertension: Monitor blood pressure closely while diuresing Continue home antihypertensives for now    Discharge Instructions  Discharge Instructions     Call MD for:  difficulty breathing, headache or visual disturbances   Complete by: As directed    Call MD for:  persistant dizziness or light-headedness   Complete by: As directed    Call MD for:  persistant nausea and vomiting   Complete by: As directed    Diet - low sodium heart healthy   Complete by: As directed    Diet Carb Modified   Complete by: As directed    Discharge instructions   Complete by: As directed    Advised to follow-up with primary care physician in 1 week. Advised to follow-up with cardiology Dr. Darrold Junker is 1 week. Advised to take torsemide 40 mg every 12 hours. Advised to continue Coreg 6.25 mg twice daily for CHF.   Increase activity slowly   Complete by: As directed    No wound care   Complete by: As directed       Allergies as of 04/06/2023       Reactions   Amlodipine Besylate Swelling   Had a reaction when taking with colcrys    Crestor [rosuvastatin]    Muscle cramps and pain   Rocephin [ceftriaxone]    unknown        Medication List     STOP taking these medications    clarithromycin 500 MG tablet Commonly known as: BIAXIN       TAKE these medications    albuterol 108 (90 Base) MCG/ACT inhaler Commonly known as: VENTOLIN HFA Inhale 2 puffs into the lungs every 6 (six) hours as needed for  wheezing or shortness of breath.   allopurinol 300 MG tablet Commonly known as: ZYLOPRIM Take 150 mg by mouth daily.   apixaban 5 MG Tabs tablet Commonly known as: ELIQUIS Take 2.5 mg by mouth 2 (two) times daily.   aspirin EC 81 MG tablet Take 81 mg by mouth daily.   atorvastatin 40 MG tablet Commonly known as: LIPITOR TAKE 1 TABLET(40 MG) BY MOUTH DAILY What changed: See the new instructions.   carvedilol 6.25 MG tablet Commonly known as: COREG Take 1 tablet (6.25 mg total) by mouth 2 (two) times daily with a meal.   ipratropium-albuterol 0.5-2.5 (3) MG/3ML Soln Commonly known as: DUONEB Inhale 3 mLs into the lungs every 4 (four) hours as needed (wheezing, shob).   isosorbide mononitrate 30 MG 24 hr tablet Commonly known as: IMDUR TAKE 1/2 TABLET(15 MG) BY MOUTH DAILY What changed: See the new instructions.   Jardiance 10 MG Tabs tablet Generic drug: empagliflozin Take 10 mg by mouth daily.   Magnesium 200 MG Tabs Take 1 tablet (200 mg total) by mouth in the morning and at bedtime.   pantoprazole 40 MG tablet Commonly known as: PROTONIX TAKE 1 TABLET BY MOUTH EVERY DAY   potassium chloride SA  20 MEQ tablet Commonly known as: KLOR-CON M Take 20 mEq by mouth daily.   torsemide 20 MG tablet Commonly known as: DEMADEX 60 mg (3 tablets) every morning and 40 mg (2 tablets) every afternoon/evening What changed:  how much to take how to take this when to take this additional instructions   triamcinolone ointment 0.1 % Commonly known as: KENALOG APPLY TOPICALLY TWICE DAILY AS DIRECTED        Follow-up Information     Paraschos, Alexander, MD Follow up in 1 week(s).   Specialty: Cardiology Contact information: [address] [facility] [place] [address] [phone]         Malva Limes, MD Follow up in 1 week(s).   Specialty: Family Medicine Contact information: [address] [place]  [address] [phone]                Allergies  Allergen Reactions   Amlodipine Besylate Swelling    Had a reaction when taking with colcrys    Crestor [Rosuvastatin]     Muscle cramps and pain   Rocephin [Ceftriaxone]     unknown    Consultations: Cardiology   Procedures/Studies: US Paracentesis  Result Date: 04/04/2023 INDICATION: 82 year old male with CHF exacerbation with recurrent ascites. Request received for diagnostic and therapeutic paracentesis. EXAM: ULTRASOUND GUIDED DIAGNOSTIC AND THERAPEUTIC LEFT LOWER QUADRANT PARACENTESIS MEDICATIONS: 20 mL 1% lidocaine COMPLICATIONS: None immediate. PROCEDURE: Informed written consent was obtained from the patient after a discussion of the risks, benefits and alternatives to treatment. A timeout was performed prior to the initiation of the procedure. Initial ultrasound scanning demonstrates a moderate amount of ascites within the left lower abdominal quadrant. The right lower abdomen was prepped and draped in the usual sterile fashion. 1% lidocaine was used for local anesthesia. Following this, a 19 gauge, 7-cm, Yueh catheter was introduced. An ultrasound image was saved for documentation purposes. The paracentesis was performed. The catheter was removed and a dressing was applied. The patient tolerated the procedure well without immediate post procedural complication. FINDINGS: A total of approximately 6.6 L of clear, amber fluid was removed. Samples were sent to the laboratory as requested by the clinical team. IMPRESSION: Successful ultrasound-guided paracentesis yielding 6.6 liters of peritoneal fluid. Procedure performed by Alex Gardener, NP and supervised by Malachy Moan, MD Electronically Signed   By: Malachy Moan M.D.   On: 04/04/2023 12:51   CT ABDOMEN PELVIS WO CONTRAST  Result Date: 04/03/2023 CLINICAL DATA:  Abdominal pain EXAM: CT ABDOMEN AND PELVIS WITHOUT CONTRAST TECHNIQUE: Multidetector CT imaging of the abdomen  and pelvis was performed following the standard protocol without IV contrast. RADIATION DOSE REDUCTION: This exam was performed according to the departmental dose-optimization program which includes automated exposure control, adjustment of the mA and/or kV according to patient size and/or use of iterative reconstruction technique. COMPARISON:  CT 02/25/2023 FINDINGS: Lower chest: Enlarged heart. Status post median sternotomy. Defibrillator leads along the right side of the heart. Persistent small to moderate right pleural effusion. There is some bilateral areas of pleural thickening and some calcification. There is some bandlike changes along the lung bases once again. Areas of interstitial septal thickening. Hepatobiliary: Nodular contours of the liver. No obvious lesion on noncontrast imaging. Gallbladder is nondilated. Pancreas: Moderate atrophy of the pancreas, unchanged. Spleen: Spleen is nonenlarged. Adrenals/Urinary Tract: Adrenal glands are preserved. Severe atrophy of the left kidney. No abnormal calcification seen within either kidney nor along the course of either ureter. Preserved  contours of the urinary bladder. 7 mm hyperdense lesion along the upper pole of the right kidney, unchanged from previous. Bosniak 2 lesion. No specific follow-up. Stomach/Bowel: Stomach is distended with fluid. The large bowel has a normal course and caliber with scattered stool. Left-sided colonic diverticula. Small bowel is nondilated. Third portion duodenal diverticulum. Vascular/Lymphatic: Normal caliber IVC. Aortic endograft in place once again, incompletely evaluated on this noncontrast examination. Scattered vascular calcifications otherwise. There are some small nodes identified in the retroperitoneum and pelvis which are not pathologic by size criteria and unchanged from previous. Reproductive: Heterogeneous prostate. Other: Moderate ascites once again identified, similar to previous. Anasarca. There is some fluid  along the right inguinal canal. Musculoskeletal: Moderate degenerative changes of the spine and pelvis. Trace retrolisthesis of L2 on L3 and L1 on L2. Multilevel stenosis. IMPRESSION: Overall appearance is similar to previous. Moderate ascites once again identified with stranding and anasarca. Nodular liver. No bowel obstruction, free air.  Colonic diverticula. Aortic endograft incompletely evaluated without contrast. Severe atrophy of the left kidney. Enlarged heart. Postop chest with defibrillator. Stable moderate right pleural effusion. Bilateral pleural thickening with calcification. Please correlate for any history of the environmental exposure. Electronically Signed   By: Karen Kays M.D.   On: 04/03/2023 12:31   DG Chest Portable 1 View  Result Date: 04/03/2023 CLINICAL DATA:  Shortness of breath EXAM: PORTABLE CHEST 1 VIEW COMPARISON:  CXR 02/25/23 FINDINGS: Left-sided dual lead cardiac device with unchanged lead positioning. Status post median sternotomy and CABG. Unchanged enlarged cardiac contours. Small right and likely a layering left-sided pleural effusion. There are hazy bibasilar airspace opacity which could represent atelectasis or infection. There are prominent bilateral interstitial opacities, favored to represent pulmonary edema. No radiographically apparent displaced rib fractures IMPRESSION: 1. Cardiomegaly, mild to moderate pulmonary edema, and small bilateral pleural effusions. 2. Hazy bibasilar airspace opacities could represent atelectasis or infection. Electronically Signed   By: Lorenza Cambridge M.D.   On: 04/03/2023 11:36     Subjective: Patient was seen and examined at bedside.  Overnight events noted.   Patient reports doing much better.  Abdominal distention has reduced.  He wants to be discharged.  Discharge Exam: Vitals:   04/06/23 0400 04/06/23 0800  BP: 118/82 116/82  Pulse: 61 62  Resp: 18 17  Temp: 97.6 F (36.4 C) 97.7 F (36.5 C)  SpO2: 100% 98%   Vitals:    04/05/23 2000 04/06/23 0400 04/06/23 0712 04/06/23 0800  BP: 114/87 118/82  116/82  Pulse: 61 61  62  Resp: 20 18  17   Temp: 97.8 F (36.6 C) 97.6 F (36.4 C)  97.7 F (36.5 C)  TempSrc: Oral Axillary    SpO2: 98% 100%  98%  Weight:   83.4 kg   Height:        General: Pt is alert, awake, not in acute distress Cardiovascular: RRR, S1/S2 +, no rubs, no gallops Respiratory: CTA bilaterally, no wheezing, no rhonchi Abdominal: Soft, NT, ND, bowel sounds + Extremities: no edema, no cyanosis    The results of significant diagnostics from this hospitalization (including imaging, microbiology, ancillary and laboratory) are listed below for reference.     Microbiology: Recent Results (from the past 240 hour(s))  SARS Coronavirus 2 by RT PCR (hospital order, performed in [facility] hospital lab) *cepheid single result test* Anterior Nasal Swab     Status: None   Collection Time: 04/03/23 11:03 AM   Specimen: Anterior Nasal Swab  Result Value Ref Range  Status   SARS Coronavirus 2 by RT PCR NEGATIVE NEGATIVE Final    Comment: (NOTE) SARS-CoV-2 target nucleic acids are NOT DETECTED.  The SARS-CoV-2 RNA is generally detectable in upper and lower respiratory specimens during the acute phase of infection. The lowest concentration of SARS-CoV-2 viral copies this assay can detect is 250 copies / mL. A negative result does not preclude SARS-CoV-2 infection and should not be used as the sole basis for treatment or other patient management decisions.  A negative result may occur with improper specimen collection / handling, submission of specimen other than nasopharyngeal swab, presence of viral mutation(s) within the areas targeted by this assay, and inadequate number of viral copies (<250 copies / mL). A negative result must be combined with clinical observations, patient history, and epidemiological information.  Fact Sheet for Patients:    RoadLapTop.co.za  Fact Sheet for Healthcare Providers: http://kim-miller.com/  This test is not yet approved or  cleared by the [place] FDA and has been authorized for detection and/or diagnosis of SARS-CoV-2 by FDA under an Emergency Use Authorization (EUA).  This EUA will remain in effect (meaning this test can be used) for the duration of the COVID-19 declaration under Section 564(b)(1) of the Act, 21 U.S.C. section 360bbb-3(b)(1), unless the authorization is terminated or revoked sooner.  Performed at [facility], [address] [place], [place] [address]   Blood culture (routine x 2)     Status: None (Preliminary result)   Collection Time: 04/03/23 12:11 PM   Specimen: BLOOD  Result Value Ref Range Status   Specimen Description BLOOD BLOOD RIGHT ARM  Final   Special Requests   Final    BOTTLES DRAWN AEROBIC AND ANAEROBIC Blood Culture results may not be optimal due to an inadequate volume of blood received in culture bottles   Culture   Final    NO GROWTH 3 DAYS Performed at [facility], [address], [place], [place] [address]    Report Status PENDING  Incomplete  Blood culture (routine x 2)     Status: None (Preliminary result)   Collection Time: 04/03/23 12:16 PM   Specimen: BLOOD  Result Value Ref Range Status   Specimen Description BLOOD BLOOD LEFT HAND  Final   Special Requests   Final    BOTTLES DRAWN AEROBIC AND ANAEROBIC Blood Culture results may not be optimal due to an inadequate volume of blood received in culture bottles   Culture   Final    NO GROWTH 3 DAYS Performed at [facility], [address] [place], [place] [address]    Report Status PENDING  Incomplete  Body fluid culture w Gram Stain     Status: None (Preliminary result)   Collection Time: 04/04/23 10:11 AM   Specimen: PATH Cytology Peritoneal fluid  Result Value Ref Range Status   Specimen Description    Final    PERITONEAL Performed at [facility], [address], [place], [place] [address]    Special Requests   Final    NONE Performed at [facility], [address] [place], [place] [address]    Gram Stain   Final    RARE WBC PRESENT, PREDOMINANTLY MONONUCLEAR NO ORGANISMS SEEN    Culture   Final    NO GROWTH 2 DAYS Performed at [facility], [address] [place], [place] [address]    Report Status PENDING  Incomplete     Labs: BNP (last 3 results) Recent Labs  01/03/23 1111 02/25/23 0933 04/03/23 1103  BNP 632.9* 1,076.2* 1,650.0*   Basic Metabolic Panel: Recent Labs  Lab 04/03/23 1044 04/04/23 0445 04/05/23 0444 04/06/23 0510  NA 135 134* 136 137  K 2.8* 3.6 3.8 3.8  CL 97* 101 103 101  CO2 27 25 26 26   GLUCOSE 108* 118* 92 104*  BUN 80* 77* 82* 79*  CREATININE 3.77* 3.66* 3.63* 3.29*  CALCIUM 8.6* 8.5* 8.9 8.7*  MG 2.5*  --  2.3  --   PHOS  --   --  5.4*  --    Liver Function Tests: Recent Labs  Lab 04/03/23 1044 04/05/23 0444  AST 32 29  ALT 11 11  ALKPHOS 144* 141*  BILITOT 2.5* 2.6*  PROT 7.2 6.6  ALBUMIN 2.9* 2.5*   No results for input(s): "LIPASE", "AMYLASE" in the last 168 hours. No results for input(s): "AMMONIA" in the last 168 hours. CBC: Recent Labs  Lab 04/03/23 1044 04/05/23 0444  WBC 3.8* 4.1  NEUTROABS 2.2  --   HGB 11.6* 11.9*  HCT 38.3* 39.4  MCV 100.5* 99.0  PLT 106* 101*   Cardiac Enzymes: No results for input(s): "CKTOTAL", "CKMB", "CKMBINDEX", "TROPONINI" in the last 168 hours. BNP: Invalid input(s): "POCBNP" CBG: No results for input(s): "GLUCAP" in the last 168 hours. D-Dimer No results for input(s): "DDIMER" in the last 72 hours. Hgb A1c No results for input(s): "HGBA1C" in the last 72 hours. Lipid Profile No results for input(s): "CHOL", "HDL", "LDLCALC", "TRIG", "CHOLHDL", "LDLDIRECT" in the last 72 hours. Thyroid function studies No results for input(s): "TSH",  "T4TOTAL", "T3FREE", "THYROIDAB" in the last 72 hours.  Invalid input(s): "FREET3" Anemia work up No results for input(s): "VITAMINB12", "FOLATE", "FERRITIN", "TIBC", "IRON", "RETICCTPCT" in the last 72 hours. Urinalysis    Component Value Date/Time   COLORURINE YELLOW (A) 02/25/2023 0933   APPEARANCEUR CLEAR (A) 02/25/2023 0933   LABSPEC 1.006 02/25/2023 0933   PHURINE 5.0 02/25/2023 0933   GLUCOSEU 50 (A) 02/25/2023 0933   HGBUR NEGATIVE 02/25/2023 0933   BILIRUBINUR NEGATIVE 02/25/2023 0933   BILIRUBINUR negative 03/31/2018 1452   KETONESUR NEGATIVE 02/25/2023 0933   PROTEINUR NEGATIVE 02/25/2023 0933   UROBILINOGEN 0.2 03/31/2018 1452   NITRITE NEGATIVE 02/25/2023 0933   LEUKOCYTESUR NEGATIVE 02/25/2023 0933   Sepsis Labs Recent Labs  Lab 04/03/23 1044 04/05/23 0444  WBC 3.8* 4.1   Microbiology Recent Results (from the past 240 hour(s))  SARS Coronavirus 2 by RT PCR (hospital order, performed in [facility] hospital lab) *cepheid single result test* Anterior Nasal Swab     Status: None   Collection Time: 04/03/23 11:03 AM   Specimen: Anterior Nasal Swab  Result Value Ref Range Status   SARS Coronavirus 2 by RT PCR NEGATIVE NEGATIVE Final    Comment: (NOTE) SARS-CoV-2 target nucleic acids are NOT DETECTED.  The SARS-CoV-2 RNA is generally detectable in upper and lower respiratory specimens during the acute phase of infection. The lowest concentration of SARS-CoV-2 viral copies this assay can detect is 250 copies / mL. A negative result does not preclude SARS-CoV-2 infection and should not be used as the sole basis for treatment or other patient management decisions.  A negative result may occur with improper specimen collection / handling, submission of specimen other than nasopharyngeal swab, presence of viral mutation(s) within the areas targeted by this assay, and inadequate number of viral copies (<250 copies / mL). A negative result must be combined with  clinical observations, patient history, and  epidemiological information.  Fact Sheet for Patients:   RoadLapTop.co.za  Fact Sheet for Healthcare Providers: http://kim-miller.com/  This test is not yet approved or  cleared by the [place] FDA and has been authorized for detection and/or diagnosis of SARS-CoV-2 by FDA under an Emergency Use Authorization (EUA).  This EUA will remain in effect (meaning this test can be used) for the duration of the COVID-19 declaration under Section 564(b)(1) of the Act, 21 U.S.C. section 360bbb-3(b)(1), unless the authorization is terminated or revoked sooner.  Performed at [facility], [address] [place], [place] [address]   Blood culture (routine x 2)     Status: None (Preliminary result)   Collection Time: 04/03/23 12:11 PM   Specimen: BLOOD  Result Value Ref Range Status   Specimen Description BLOOD BLOOD RIGHT ARM  Final   Special Requests   Final    BOTTLES DRAWN AEROBIC AND ANAEROBIC Blood Culture results may not be optimal due to an inadequate volume of blood received in culture bottles   Culture   Final    NO GROWTH 3 DAYS Performed at [facility], [address] [place], [place] [address]    Report Status PENDING  Incomplete  Blood culture (routine x 2)     Status: None (Preliminary result)   Collection Time: 04/03/23 12:16 PM   Specimen: BLOOD  Result Value Ref Range Status   Specimen Description BLOOD BLOOD LEFT HAND  Final   Special Requests   Final    BOTTLES DRAWN AEROBIC AND ANAEROBIC Blood Culture results may not be optimal due to an inadequate volume of blood received in culture bottles   Culture   Final    NO GROWTH 3 DAYS Performed at [facility], [address] [place], [place] [address]    Report Status PENDING  Incomplete  Body fluid culture w Gram Stain     Status: None (Preliminary result)   Collection Time: 04/04/23 10:11 AM    Specimen: PATH Cytology Peritoneal fluid  Result Value Ref Range Status   Specimen Description   Final    PERITONEAL Performed at [facility], [address] [place], [place] [address]    Special Requests   Final    NONE Performed at [facility], [address] [place], [place] [address]    Gram Stain   Final    RARE WBC PRESENT, PREDOMINANTLY MONONUCLEAR NO ORGANISMS SEEN    Culture   Final    NO GROWTH 2 DAYS Performed at [facility], [address], [place], [place] [address]    Report Status PENDING  Incomplete     Time coordinating discharge: Over 30 minutes  SIGNED:   Willeen Niece, MD  [facility] 04/06/2023, 2:49 PM Pager   If 7PM-7AM, please contact night-coverage

## 2023-04-07 ENCOUNTER — Telehealth: Payer: Self-pay | Admitting: *Deleted

## 2023-04-07 DIAGNOSIS — N184 Chronic kidney disease, stage 4 (severe): Secondary | ICD-10-CM | POA: Diagnosis not present

## 2023-04-07 LAB — CULTURE, BLOOD (ROUTINE X 2): Culture: NO GROWTH

## 2023-04-07 LAB — BODY FLUID CULTURE W GRAM STAIN

## 2023-04-07 NOTE — Transitions of Care (Post Inpatient/ED Visit) (Signed)
   04/07/2023  Name: Taylor [name] MRN: [id number] DOB: [birth year]  Today's TOC FU Call Status: Today's TOC FU Call Status:: Unsuccessul Call (1st Attempt) Unsuccessful Call (1st Attempt) Date: 04/07/23  Attempted to reach the patient regarding the most recent Inpatient/ED visit.  Follow Up Plan: Additional outreach attempts will be made to reach the patient to complete the Transitions of Care (Post Inpatient/ED visit) call.   Gean Maidens BSN RN [facility] Care Management [phone]

## 2023-04-07 NOTE — Consult Note (Signed)
[facility] Customer service manager [facility]) Accountable Care Organization (ACO) [facility] Liaison Note  04/07/2023  Taylor [name] [birth year] [id number]  Location: [facility] RN Hospital Liaison screened the patient remotely at [facility].  Insurance: [employer]   Taylor [name] is a 82 y.o. male who is a Primary Care Patient of Malva Limes, MD. The patient was screened for readmission hospitalization with noted medium risk score for unplanned readmission risk with 3 IP/1 ED in 6 months.  The patient was assessed for potential [facility]) Care Management service needs for post hospital transition for care coordination. Review of patient's electronic medical record reveals patient is currently participating in cardiac rehabilitation and may consider [facility] or out pt rehab once completed. Unsuccessful with outreach to this pt for [facility] services.  Plan: [facility] Liaison will continue to follow progress and disposition to asess for post hospital community care coordination/management needs.  Referral request for community care coordination: anticipate [facility] Transitions of Care Team follow up.   [facility] Care Management/Population Health does not replace or interfere with any arrangements made by the Inpatient Transition of Care team.   For questions contact:   Elliot Cousin, RN, BSN [facility] Liaison [place]   [facility]  Population Health Office Hours MTWF 8:00 am to 6 pm off on Thursday [phone] mobile [phone] [Office toll free line][facility] Office Hours are M-F 8:30 - 5 pm 24 hour nurse advise line [phone] Conceirge  [email or web address]

## 2023-04-08 ENCOUNTER — Telehealth: Payer: Self-pay | Admitting: *Deleted

## 2023-04-08 ENCOUNTER — Ambulatory Visit: Payer: Self-pay | Admitting: *Deleted

## 2023-04-08 LAB — CULTURE, BLOOD (ROUTINE X 2): Culture: NO GROWTH

## 2023-04-08 NOTE — Patient Outreach (Signed)
  Care Coordination   Follow Up Visit Note   04/08/2023 Name: Taylor [name] MRN: [id number] DOB: 1941/02/11  Taylor [name] is a 82 y.o. year old male who sees [name], Demetrios Isaacs, MD for primary care. I spoke with  [name] by phone today.  What matters to the patients health and wellness today?  Remain out of the hospital as he was recently discharged for HF/fluid overload again.      Goals Addressed             This Visit's Progress    Develop Plan of care for Management of CHF   Not on track    Care Coordination Interventions: Basic overview and discussion of pathophysiology of Heart Failure reviewed Provided education on low sodium diet Reviewed Heart Failure Action Plan in depth and provided written copy Assessed need for readable accurate scales in home Advised patient to weigh each morning after emptying bladder Discussed importance of daily weight and advised patient to weigh and record daily Reviewed role of diuretics in prevention of fluid overload and management of heart failure; Readmitted to hospital Follow up appointments:  HF clinic on 6/4.  PCP is not until June for AWV        SDOH assessments and interventions completed:  No     Care Coordination Interventions:  Yes, provided   Interventions Today    Flowsheet Row Most Recent Value  Chronic Disease   Chronic disease during today's visit Congestive Heart Failure (CHF)  General Interventions   General Interventions Discussed/Reviewed General Interventions Reviewed, Doctor Visits, Durable Medical Equipment (DME), Labs  [[facility], will start cardiac rehab]  Labs Kidney Function  [state kidney function is not the best, MD discussed future dialysis, he does not want]  Doctor Visits Discussed/Reviewed Doctor Visits Reviewed, PCP, Specialist, Annual Wellness Visits  [cardiac rehab 5/17, HF clinic 5/22, nephrology 5/28, and AWV 6/17]  Durable Medical Equipment (DME) Oxygen  [State saturations are upper 90%  even on RA, not using oxygen as much, but still having fluid overload and shortness of breath]  PCP/Specialist Visits Compliance with follow-up visit  Exercise Interventions   Exercise Discussed/Reviewed Weight Managment  Weight Management Weight maintenance  [Continues daily weights.]  Education Interventions   Education Provided Provided Education  Provided Verbal Education On [employer], Medication, When to see the doctor, Other, Nutrition  [Discussed possibly needing palliative and/or hospice in the future if he does not proceed with diayslis, does not feel he need this right now]  Nutrition Interventions   Nutrition Discussed/Reviewed Nutrition Reviewed, Adding fruits and vegetables, Supplmental nutrition  [increasing protein, starting renal smart diet]        Follow up plan: Follow up call scheduled for 6/5    Encounter Outcome:  Pt. Visit Completed   Kemper Durie, RN, MSN, [facility] Care Management Care Management Coordinator [phone]

## 2023-04-08 NOTE — Transitions of Care (Post Inpatient/ED Visit) (Signed)
04/08/2023  Name: Taylor [name] MRN: [id number] DOB: 08-04-1941  Today's TOC FU Call Status: Today's TOC FU Call Status:: Successful TOC FU Call Competed TOC FU Call Complete Date: 04/08/23  Transition Care Management Follow-up Telephone Call Date of Discharge: 04/06/23 Discharge Facility: [facility]) Type of Discharge: Inpatient Admission Primary Inpatient Discharge Diagnosis:: Acute Hypoxic respiratory failure How have you been since you were released from the hospital?: Better (I am doing ok) Any questions or concerns?: No  Items Reviewed: Did you receive and understand the discharge instructions provided?: Yes Medications obtained,verified, and reconciled?: Yes (Medications Reviewed) Any new allergies since your discharge?: No Dietary orders reviewed?: No Do you have support at home?: Yes People in Home: spouse Name of Support/Comfort Primary Source: Taylor [name]  Medications Reviewed Today: Medications Reviewed Today     Reviewed by Luella Cook, RN (Case Manager) on 04/08/23 at 1024  Med List Status: <None>   Medication Order Taking? Sig Documenting Provider Last Dose Status Informant  albuterol (VENTOLIN HFA) 108 (90 Base) MCG/ACT inhaler [id number] Yes Inhale 2 puffs into the lungs every 6 (six) hours as needed for wheezing or shortness of breath. Malva Limes, MD Taking Active Self  allopurinol (ZYLOPRIM) 300 MG tablet [id number] Yes Take 150 mg by mouth daily. [provider] Taking Active Self  apixaban (ELIQUIS) 5 MG TABS tablet [id number] Yes Take 2.5 mg by mouth 2 (two) times daily. [provider] Taking Active Self  aspirin 81 MG EC tablet [id number] Yes Take 81 mg by mouth daily.  [provider] Taking Active Self           Med Note Lauretta Chester, ALEXANDRE A   Wed Jan 24, 2021  8:17 AM)    atorvastatin (LIPITOR) 40 MG tablet [id number] Yes TAKE 1 TABLET(40 MG) BY MOUTH DAILY  Patient taking differently: Take  40 mg by mouth daily.   Malva Limes, MD Taking Active Self  carvedilol (COREG) 6.25 MG tablet [id number] Yes Take 1 tablet (6.25 mg total) by mouth 2 (two) times daily with a meal. Sunnie Nielsen, DO Taking Active Self  empagliflozin (JARDIANCE) 10 MG TABS tablet [id number] Yes Take 10 mg by mouth daily. [provider] Taking Active Self  ipratropium-albuterol (DUONEB) 0.5-2.5 (3) MG/3ML SOLN [id number] Yes Inhale 3 mLs into the lungs every 4 (four) hours as needed (wheezing, shob). [provider] Taking Active Self  isosorbide mononitrate (IMDUR) 30 MG 24 hr tablet [id number] Yes TAKE 1/2 TABLET(15 MG) BY MOUTH DAILY  Patient taking differently: Take 15 mg by mouth daily.   Malva Limes, MD Taking Active Self  Magnesium 200 MG TABS [id number] Yes Take 1 tablet (200 mg total) by mouth in the morning and at bedtime. Clarisa Kindred A, FNP Taking Active Self  pantoprazole (PROTONIX) 40 MG tablet [id number] Yes TAKE 1 TABLET BY MOUTH EVERY DAY Malva Limes, MD Taking Active Self  potassium chloride SA (KLOR-CON M) 20 MEQ tablet [id number] Yes Take 20 mEq by mouth daily. [provider] Taking Active Self  torsemide (DEMADEX) 20 MG tablet [id number] Yes 60 mg (3 tablets) every morning and 40 mg (2 tablets) every afternoon/evening  Patient taking differently: Take 40 mg by mouth 2 (two) times daily.   Sunnie Nielsen, DO Taking Active Self  triamcinolone ointment (KENALOG) 0.1 % [id number] Yes APPLY TOPICALLY TWICE DAILY AS DIRECTED Malva Limes, MD Taking Active Self            Home Care  and Equipment/Supplies: Were Home Health Services Ordered?: No Any new equipment or medical supplies ordered?: No  Functional Questionnaire: Do you need assistance with bathing/showering or dressing?: No Do you need assistance with meal preparation?: Yes Do you need assistance with eating?: No Do you have difficulty maintaining continence: No Do you need assistance  with getting out of bed/getting out of a chair/moving?: No Do you have difficulty managing or taking your medications?: No  Follow up appointments reviewed: PCP Follow-up appointment confirmed?: No MD Provider Line [phone] Given: Yes (Patient refused RN to help make appointment. He wanted to make himself) Specialist Hospital Follow-up appointment confirmed?: Yes Date of Specialist follow-up appointment?: 04/10/23 Follow-Up Specialty Provider:: [id number] Cardiac Rehab, [id number] Clarisa Kindred 2:00 Do you need transportation to your follow-up appointment?: No Do you understand care options if your condition(s) worsen?: Yes-patient verbalized understanding  SDOH Interventions Today    Flowsheet Row Most Recent Value  SDOH Interventions   Food Insecurity Interventions Intervention Not Indicated  Housing Interventions Intervention Not Indicated  Transportation Interventions Intervention Not Indicated      Interventions Today    Flowsheet Row Most Recent Value  General Interventions   General Interventions Discussed/Reviewed General Interventions Discussed, General Interventions Reviewed, Doctor Visits  Doctor Visits Discussed/Reviewed Doctor Visits Discussed, Doctor Visits Reviewed  [Patient has specialist viits scheduled but did not want me to make PCP appt. He stated he wanted to get some of the other visits out of the way and he would make it himself.]  Pharmacy Interventions   Pharmacy Dicussed/Reviewed Pharmacy Topics Discussed       RN Care Coordinator Kemper Durie follow up 04/08/2023  Gean Maidens BSN RN [facility] Care Management [phone]

## 2023-04-10 VITALS — Ht 68.5 in | Wt 188.9 lb

## 2023-04-10 DIAGNOSIS — I5022 Chronic systolic (congestive) heart failure: Secondary | ICD-10-CM

## 2023-04-10 NOTE — Patient Instructions (Signed)
Patient Instructions  Patient Details  Name: Taylor [name] MRN: [id number] Date of Birth: June 28, 1941 Referring Provider:  Marcina Millard, MD  Below are your personal goals for exercise, nutrition, and risk factors. Our goal is to help you stay on track towards obtaining and maintaining these goals. We will be discussing your progress on these goals with you throughout the program.  Initial Exercise Prescription:  Initial Exercise Prescription - 04/10/23 1400       Date of Initial Exercise RX and Referring Provider   Date 04/10/23    Referring Provider Dr. Marcina Millard, MD      Oxygen   Maintain Oxygen Saturation 88% or higher      Treadmill   MPH 1    Grade 0    Minutes 15    METs 1.8      NuStep   Level 2    SPM 80    Minutes 15    METs 1.26      Biostep-RELP   Level 1    SPM 80    Minutes 15    METs 1.23      Prescription Details   Frequency (times per week) 2    Duration Progress to 30 minutes of continuous aerobic without signs/symptoms of physical distress      Intensity   THRR 40-80% of Max Heartrate 96-125    Ratings of Perceived Exertion 11-13    Perceived Dyspnea 0-4      Progression   Progression Continue to progress workloads to maintain intensity without signs/symptoms of physical distress.      Resistance Training   Training Prescription Yes    Weight 3 lb    Reps 10-15             Exercise Goals: Frequency: Be able to perform aerobic exercise two to three times per week in program working toward 2-5 days per week of home exercise.  Intensity: Work with a perceived exertion of 11 (fairly light) - 15 (hard) while following your exercise prescription.  We will make changes to your prescription with you as you progress through the program.   Duration: Be able to do 30 to 45 minutes of continuous aerobic exercise in addition to a 5 minute warm-up and a 5 minute cool-down routine.   Nutrition Goals: Your personal nutrition goals  will be established when you do your nutrition analysis with the dietician.  The following are general nutrition guidelines to follow: Cholesterol < 200mg /day Sodium < 1500mg /day Fiber: Men over 50 yrs - 30 grams per day  Personal Goals:  Personal Goals and Risk Factors at Admission - 04/10/23 1412       Core Components/Risk Factors/Patient Goals on Admission    Weight Management Yes;Weight Loss    Intervention Weight Management: Provide education and appropriate resources to help participant work on and attain dietary goals.;Weight Management: Develop a combined nutrition and exercise program designed to reach desired caloric intake, while maintaining appropriate intake of nutrient and fiber, sodium and fats, and appropriate energy expenditure required for the weight goal.;Weight Management/Obesity: Establish reasonable short term and long term weight goals.;Obesity: Provide education and appropriate resources to help participant work on and attain dietary goals.    Admit Weight 188 lb 14.4 oz (85.7 kg)    Goal Weight: Short Term 183 lb (83 kg)    Goal Weight: Long Term 178 lb (80.7 kg)    Expected Outcomes Short Term: Continue to assess and modify interventions until short term weight  is achieved;Long Term: Adherence to nutrition and physical activity/exercise program aimed toward attainment of established weight goal;Weight Loss: Understanding of general recommendations for a balanced deficit meal plan, which promotes 1-2 lb weight loss per week and includes a negative energy balance of [phone] kcal/d;Understanding recommendations for meals to include 15-35% energy as protein, 25-35% energy from fat, 35-60% energy from carbohydrates, less than 200mg  of dietary cholesterol, 20-35 gm of total fiber daily;Understanding of distribution of calorie intake throughout the day with the consumption of 4-5 meals/snacks    Diabetes Yes    Intervention Provide education about signs/symptoms and action to  take for hypo/hyperglycemia.;Provide education about proper nutrition, including hydration, and aerobic/resistive exercise prescription along with prescribed medications to achieve blood glucose in normal ranges: Fasting glucose 65-99 mg/dL    Expected Outcomes Long Term: Attainment of HbA1C < 7%.;Short Term: Participant verbalizes understanding of the signs/symptoms and immediate care of hyper/hypoglycemia, proper foot care and importance of medication, aerobic/resistive exercise and nutrition plan for blood glucose control.    Heart Failure Yes    Intervention Provide a combined exercise and nutrition program that is supplemented with education, support and counseling about heart failure. Directed toward relieving symptoms such as shortness of breath, decreased exercise tolerance, and extremity edema.    Expected Outcomes Improve functional capacity of life;Short term: Attendance in program 2-3 days a week with increased exercise capacity. Reported lower sodium intake. Reported increased fruit and vegetable intake. Reports medication compliance.;Short term: Daily weights obtained and reported for increase. Utilizing diuretic protocols set by physician.;Long term: Adoption of self-care skills and reduction of barriers for early signs and symptoms recognition and intervention leading to self-care maintenance.    Hypertension Yes    Intervention Provide education on lifestyle modifcations including regular physical activity/exercise, weight management, moderate sodium restriction and increased consumption of fresh fruit, vegetables, and low fat dairy, alcohol moderation, and smoking cessation.;Monitor prescription use compliance.    Expected Outcomes Short Term: Continued assessment and intervention until BP is < 140/76mm HG in hypertensive participants. < 130/70mm HG in hypertensive participants with diabetes, heart failure or chronic kidney disease.;Long Term: Maintenance of blood pressure at goal levels.     Lipids Yes    Intervention Provide education and support for participant on nutrition & aerobic/resistive exercise along with prescribed medications to achieve LDL 70mg , HDL >40mg .    Expected Outcomes Short Term: Participant states understanding of desired cholesterol values and is compliant with medications prescribed. Participant is following exercise prescription and nutrition guidelines.;Long Term: Cholesterol controlled with medications as prescribed, with individualized exercise RX and with personalized nutrition plan. Value goals: LDL < 70mg , HDL > 40 mg.            Exercise Goals and Review:  Exercise Goals     Row Name 04/10/23 1415             Exercise Goals   Increase Physical Activity Yes       Intervention Provide advice, education, support and counseling about physical activity/exercise needs.;Develop an individualized exercise prescription for aerobic and resistive training based on initial evaluation findings, risk stratification, comorbidities and participant's personal goals.       Expected Outcomes Short Term: Attend rehab on a regular basis to increase amount of physical activity.;Long Term: Add in home exercise to make exercise part of routine and to increase amount of physical activity.;Long Term: Exercising regularly at least 3-5 days a week.       Increase Strength and Stamina Yes  Intervention Provide advice, education, support and counseling about physical activity/exercise needs.;Develop an individualized exercise prescription for aerobic and resistive training based on initial evaluation findings, risk stratification, comorbidities and participant's personal goals.       Expected Outcomes Short Term: Increase workloads from initial exercise prescription for resistance, speed, and METs.;Short Term: Perform resistance training exercises routinely during rehab and add in resistance training at home;Long Term: Improve cardiorespiratory fitness, muscular  endurance and strength as measured by increased METs and functional capacity ( )       Able to understand and use rate of perceived exertion (RPE) scale Yes       Intervention Provide education and explanation on how to use RPE scale       Expected Outcomes Short Term: Able to use RPE daily in rehab to express subjective intensity level;Long Term:  Able to use RPE to guide intensity level when exercising independently       Able to understand and use Dyspnea scale Yes       Intervention Provide education and explanation on how to use Dyspnea scale       Expected Outcomes Short Term: Able to use Dyspnea scale daily in rehab to express subjective sense of shortness of breath during exertion;Long Term: Able to use Dyspnea scale to guide intensity level when exercising independently       Knowledge and understanding of Target Heart Rate Range (THRR) Yes       Intervention Provide education and explanation of THRR including how the numbers were predicted and where they are located for reference       Expected Outcomes Short Term: Able to state/look up THRR;Long Term: Able to use THRR to govern intensity when exercising independently;Short Term: Able to use daily as guideline for intensity in rehab       Able to check pulse independently Yes       Intervention Provide education and demonstration on how to check pulse in carotid and radial arteries.;Review the importance of being able to check your own pulse for safety during independent exercise       Expected Outcomes Short Term: Able to explain why pulse checking is important during independent exercise;Long Term: Able to check pulse independently and accurately       Understanding of Exercise Prescription Yes       Intervention Provide education, explanation, and written materials on patient's individual exercise prescription       Expected Outcomes Short Term: Able to explain program exercise prescription;Long Term: Able to explain home exercise  prescription to exercise independently

## 2023-04-10 NOTE — Progress Notes (Signed)
Cardiac Individual Treatment Plan  Patient Details  Name: Taylor [name] MRN: [id number] Date of Birth: March 18, 1941 Referring Provider:   Flowsheet Row Cardiac Rehab from 04/10/2023 in [facility] Cardiac and Pulmonary Rehab  Referring Provider Dr. Marcina Millard, MD       Initial Encounter Date:  Flowsheet Row Cardiac Rehab from 04/10/2023 in [facility] Cardiac and Pulmonary Rehab  Date 04/10/23       Visit Diagnosis: Heart failure, chronic systolic (HCC)  Patient's Home Medications on Admission:  Current Outpatient Medications:    albuterol (VENTOLIN HFA) 108 (90 Base) MCG/ACT inhaler, Inhale 2 puffs into the lungs every 6 (six) hours as needed for wheezing or shortness of breath., Disp: 8 g, Rfl: 2   allopurinol (ZYLOPRIM) 300 MG tablet, Take 150 mg by mouth daily., Disp: , Rfl:    apixaban (ELIQUIS) 5 MG TABS tablet, Take 2.5 mg by mouth 2 (two) times daily., Disp: , Rfl:    aspirin 81 MG EC tablet, Take 81 mg by mouth daily. , Disp: , Rfl:    atorvastatin (LIPITOR) 40 MG tablet, TAKE 1 TABLET(40 MG) BY MOUTH DAILY (Patient taking differently: Take 40 mg by mouth daily.), Disp: 90 tablet, Rfl: 1   carvedilol (COREG) 6.25 MG tablet, Take 1 tablet (6.25 mg total) by mouth 2 (two) times daily with a meal., Disp: 60 tablet, Rfl: 0   empagliflozin (JARDIANCE) 10 MG TABS tablet, Take 10 mg by mouth daily., Disp: , Rfl:    ipratropium-albuterol (DUONEB) 0.5-2.5 (3) MG/3ML SOLN, Inhale 3 mLs into the lungs every 4 (four) hours as needed (wheezing, shob)., Disp: , Rfl:    isosorbide mononitrate (IMDUR) 30 MG 24 hr tablet, TAKE 1/2 TABLET(15 MG) BY MOUTH DAILY (Patient taking differently: Take 15 mg by mouth daily.), Disp: 45 tablet, Rfl: 2   Magnesium 200 MG TABS, Take 1 tablet (200 mg total) by mouth in the morning and at bedtime., Disp: 60 tablet, Rfl: 5   pantoprazole (PROTONIX) 40 MG tablet, TAKE 1 TABLET BY MOUTH EVERY DAY, Disp: 90 tablet, Rfl: 4   potassium chloride SA (KLOR-CON M) 20 MEQ  tablet, Take 20 mEq by mouth daily., Disp: , Rfl:    torsemide (DEMADEX) 20 MG tablet, 60 mg (3 tablets) every morning and 40 mg (2 tablets) every afternoon/evening (Patient taking differently: Take 40 mg by mouth 2 (two) times daily.), Disp: 90 tablet, Rfl: 0   triamcinolone ointment (KENALOG) 0.1 %, APPLY TOPICALLY TWICE DAILY AS DIRECTED, Disp: 454 g, Rfl: 3  Past Medical History: Past Medical History:  Diagnosis Date   AAA (abdominal aortic aneurysm) (HCC) 06/03/2007   [facility]; Dr. Hart [place]   AICD (automatic cardioverter/defibrillator) present    Arrhythmia    atrial fibrillation   Barrett's esophagus    Bladder cancer (HCC)    Bradycardia    CAD (coronary artery disease)    CAP (community acquired pneumonia) 11/13/2019   CHF (congestive heart failure) (HCC)    Cluster headache    COVID-19 09/27/2021   DDD (degenerative disc disease), lumbar    Diabetes mellitus without complication (HCC)    Dyspnea    WITH EXERTION   Edema    LEFT ANKLE   Fracture of skull base (HCC) 1997   due to fall   GERD (gastroesophageal reflux disease)    Gout    History of bladder cancer 12/1995   Hyperlipidemia    Hypertension    Hypocalcemia 04/19/2020   Possibly secondary to diuretics.   Low=5.9  04/19/2020   Malignant melanoma (HCC) 12/2012   right dorsal forearm excised   Myocardial infarction [facility])    LAST 2014   Osteoarthritis of knee    Other specified complication of vascular prosthetic devices, implants and grafts, initial encounter (HCC) 08/22/2021   Pacemaker 10/10/2006   Pancreatitis 11/22/2019   Pneumonia    2016   Pre-diabetes    Psoriasis    Rib fracture 1997   due to fall   Sleep apnea    CPAP   Stroke (HCC)    Venous incompetence     Tobacco Use: Social History   Tobacco Use  Smoking Status Former   Packs/day: 1.00   Years: 30.00   Additional pack years: 0.00   Total pack years: 30.00   Types: Cigarettes   Quit date: 11/26/1999   Years  since quitting: 23.3  Smokeless Tobacco Never    Labs: Review Flowsheet  More data exists      Latest Ref Rng & Units 12/11/2021 03/26/2022 08/06/2022 12/24/2022 02/25/2023  Labs for ITP Cardiac and Pulmonary Rehab  Cholestrol 100 - 199 mg/dL 161  - - - -  LDL (calc) 0 - 99 mg/dL 54  - - - -  HDL-C >09 mg/dL 38  - - - -  Trlycerides 0 - 149 mg/dL 604  - - - -  Hemoglobin A1c 4.8 - 5.6 % 6.5  6.5  6.5  6.5  -  Bicarbonate 20.0 - 28.0 mmol/L - - - - 28.2   O2 Saturation % - - - - 25      Exercise Target Goals: Exercise Program Goal: Individual exercise prescription set using results from initial 6 min walk test and THRR while considering  patient's activity barriers and safety.   Exercise Prescription Goal: Initial exercise prescription builds to 30-45 minutes a day of aerobic activity, 2-3 days per week.  Home exercise guidelines will be given to patient during program as part of exercise prescription that the participant will acknowledge.   Education: Aerobic Exercise: - Group verbal and visual presentation on the components of exercise prescription. Introduces F.I.T.T principle from ACSM for exercise prescriptions.  Reviews F.I.T.T. principles of aerobic exercise including progression. Written material given at graduation. Flowsheet Row Cardiac Rehab from 04/10/2023 in [facility] Cardiac and Pulmonary Rehab  Education need identified 04/10/23       Education: Resistance Exercise: - Group verbal and visual presentation on the components of exercise prescription. Introduces F.I.T.T principle from ACSM for exercise prescriptions  Reviews F.I.T.T. principles of resistance exercise including progression. Written material given at graduation.    Education: Exercise & Equipment Safety: - Individual verbal instruction and demonstration of equipment use and safety with use of the equipment. Flowsheet Row Cardiac Rehab from 04/10/2023 in [facility] Cardiac and Pulmonary Rehab  Date 04/03/23  Educator  [facility]  Instruction Review Code 1- Verbalizes Understanding       Education: Exercise Physiology & General Exercise Guidelines: - Group verbal and written instruction with models to review the exercise physiology of the cardiovascular system and associated critical values. Provides general exercise guidelines with specific guidelines to those with heart or lung disease.    Education: Flexibility, Balance, Mind/Body Relaxation: - Group verbal and visual presentation with interactive activity on the components of exercise prescription. Introduces F.I.T.T principle from ACSM for exercise prescriptions. Reviews F.I.T.T. principles of flexibility and balance exercise training including progression. Also discusses the mind body connection.  Reviews various relaxation techniques to help reduce and manage stress (i.e.  Deep breathing, progressive muscle relaxation, and visualization). Balance handout provided to take home. Written material given at graduation.   Activity Barriers & Risk Stratification:  Activity Barriers & Cardiac Risk Stratification - 04/10/23 1413       Activity Barriers & Cardiac Risk Stratification   Activity Barriers Arthritis;Balance Concerns    Cardiac Risk Stratification High             6 Minute Walk:  6 Minute Walk     Row Name 04/10/23 1412         6 Minute Walk   Phase Initial     Distance 765 feet     Walk Time 6 minutes     # of Rest Breaks 0     MPH 1.45     METS 1.26     RPE 15     Perceived Dyspnea  3     VO2 Peak 4.4     Symptoms Yes (comment)     Comments leg fatigue     Resting HR 67 bpm     Resting BP 102/50     Resting Oxygen Saturation  97 %     Exercise Oxygen Saturation  during 6 min walk 94 %     Max Ex. HR 95 bpm     Max Ex. BP 118/58     2 Minute Post BP 104/52              Oxygen Initial Assessment:  Oxygen Initial Assessment - 04/03/23 1005       Home Oxygen   Home Oxygen Device --    Sleep Oxygen Prescription --     Liters per minute --    Home Exercise Oxygen Prescription --    Home Resting Oxygen Prescription --    Compliance with Home Oxygen Use --      Initial 6 min Walk   Oxygen Used --      Program Oxygen Prescription   Program Oxygen Prescription --      Intervention   Short Term Goals --    Long  Term Goals --             Oxygen Re-Evaluation:   Oxygen Discharge (Final Oxygen Re-Evaluation):   Initial Exercise Prescription:  Initial Exercise Prescription - 04/10/23 1400       Date of Initial Exercise RX and Referring Provider   Date 04/10/23    Referring Provider Dr. Marcina Millard, MD      Oxygen   Maintain Oxygen Saturation 88% or higher      Treadmill   MPH 1    Grade 0    Minutes 15    METs 1.8      NuStep   Level 2    SPM 80    Minutes 15    METs 1.26      Biostep-RELP   Level 1    SPM 80    Minutes 15    METs 1.23      Prescription Details   Frequency (times per week) 2    Duration Progress to 30 minutes of continuous aerobic without signs/symptoms of physical distress      Intensity   THRR 40-80% of Max Heartrate 96-125    Ratings of Perceived Exertion 11-13    Perceived Dyspnea 0-4      Progression   Progression Continue to progress workloads to maintain intensity without signs/symptoms of physical distress.      Resistance Training  Training Prescription Yes    Weight 3 lb    Reps 10-15             Perform Capillary Blood Glucose checks as needed.  Exercise Prescription Changes:   Exercise Prescription Changes     Row Name 04/10/23 1400             Response to Exercise   Blood Pressure (Admit) 102/50       Blood Pressure (Exercise) 118/58       Blood Pressure (Exit) 104/52       Heart Rate (Admit) 67 bpm       Heart Rate (Exercise) 95 bpm       Heart Rate (Exit) 72 bpm       Oxygen Saturation (Admit) 97 %       Oxygen Saturation (Exercise) 94 %       Rating of Perceived Exertion (Exercise) 15        Perceived Dyspnea (Exercise) 3       Symptoms leg fatigue       Comments Results                Exercise Comments:   Exercise Goals and Review:   Exercise Goals     Row Name 04/10/23 1415             Exercise Goals   Increase Physical Activity Yes       Intervention Provide advice, education, support and counseling about physical activity/exercise needs.;Develop an individualized exercise prescription for aerobic and resistive training based on initial evaluation findings, risk stratification, comorbidities and participant's personal goals.       Expected Outcomes Short Term: Attend rehab on a regular basis to increase amount of physical activity.;Long Term: Add in home exercise to make exercise part of routine and to increase amount of physical activity.;Long Term: Exercising regularly at least 3-5 days a week.       Increase Strength and Stamina Yes       Intervention Provide advice, education, support and counseling about physical activity/exercise needs.;Develop an individualized exercise prescription for aerobic and resistive training based on initial evaluation findings, risk stratification, comorbidities and participant's personal goals.       Expected Outcomes Short Term: Increase workloads from initial exercise prescription for resistance, speed, and METs.;Short Term: Perform resistance training exercises routinely during rehab and add in resistance training at home;Long Term: Improve cardiorespiratory fitness, muscular endurance and strength as measured by increased METs and functional capacity ( )       Able to understand and use rate of perceived exertion (RPE) scale Yes       Intervention Provide education and explanation on how to use RPE scale       Expected Outcomes Short Term: Able to use RPE daily in rehab to express subjective intensity level;Long Term:  Able to use RPE to guide intensity level when exercising independently       Able to understand and use  Dyspnea scale Yes       Intervention Provide education and explanation on how to use Dyspnea scale       Expected Outcomes Short Term: Able to use Dyspnea scale daily in rehab to express subjective sense of shortness of breath during exertion;Long Term: Able to use Dyspnea scale to guide intensity level when exercising independently       Knowledge and understanding of Target Heart Rate Range (THRR) Yes       Intervention Provide education and  explanation of THRR including how the numbers were predicted and where they are located for reference       Expected Outcomes Short Term: Able to state/look up THRR;Long Term: Able to use THRR to govern intensity when exercising independently;Short Term: Able to use daily as guideline for intensity in rehab       Able to check pulse independently Yes       Intervention Provide education and demonstration on how to check pulse in carotid and radial arteries.;Review the importance of being able to check your own pulse for safety during independent exercise       Expected Outcomes Short Term: Able to explain why pulse checking is important during independent exercise;Long Term: Able to check pulse independently and accurately       Understanding of Exercise Prescription Yes       Intervention Provide education, explanation, and written materials on patient's individual exercise prescription       Expected Outcomes Short Term: Able to explain program exercise prescription;Long Term: Able to explain home exercise prescription to exercise independently                Exercise Goals Re-Evaluation :   Discharge Exercise Prescription (Final Exercise Prescription Changes):  Exercise Prescription Changes - 04/10/23 1400       Response to Exercise   Blood Pressure (Admit) 102/50    Blood Pressure (Exercise) 118/58    Blood Pressure (Exit) 104/52    Heart Rate (Admit) 67 bpm    Heart Rate (Exercise) 95 bpm    Heart Rate (Exit) 72 bpm    Oxygen Saturation  (Admit) 97 %    Oxygen Saturation (Exercise) 94 %    Rating of Perceived Exertion (Exercise) 15    Perceived Dyspnea (Exercise) 3    Symptoms leg fatigue    Comments Results             Nutrition:  Target Goals: Understanding of nutrition guidelines, daily intake of sodium 1500mg , cholesterol 200mg , calories 30% from fat and 7% or less from saturated fats, daily to have 5 or more servings of fruits and vegetables.  Education: All About Nutrition: -Group instruction provided by verbal, written material, interactive activities, discussions, models, and posters to present general guidelines for heart healthy nutrition including fat, fiber, MyPlate, the role of sodium in heart healthy nutrition, utilization of the nutrition label, and utilization of this knowledge for meal planning. Follow up email sent as well. Written material given at graduation. Flowsheet Row Cardiac Rehab from 04/10/2023 in [facility] Cardiac and Pulmonary Rehab  Education need identified 04/10/23       Biometrics:  Pre Biometrics - 04/10/23 1416       Pre Biometrics   Height 5' 8.5" (1.74 m)    Weight 188 lb 14.4 oz (85.7 kg)    Waist Circumference 44 inches    Hip Circumference 40 inches    Waist to Hip Ratio 1.1 %    BMI (Calculated) 28.3    Single Leg Stand 3.3 seconds              Nutrition Therapy Plan and Nutrition Goals:  Nutrition Therapy & Goals - 04/10/23 1401       Intervention Plan   Intervention Prescribe, educate and counsel regarding individualized specific dietary modifications aiming towards targeted core components such as weight, hypertension, lipid management, diabetes, heart failure and other comorbidities.    Expected Outcomes Short Term Goal: Understand basic principles of dietary content, such  as calories, fat, sodium, cholesterol and nutrients.;Short Term Goal: A plan has been developed with personal nutrition goals set during dietitian appointment.;Long Term Goal:  Adherence to prescribed nutrition plan.             Nutrition Assessments:  MEDIFICTS Score Key: ?70 Need to make dietary changes  40-70 Heart Healthy Diet ? 40 Therapeutic Level Cholesterol Diet  Flowsheet Row Cardiac Rehab from 04/10/2023 in [facility] Cardiac and Pulmonary Rehab  Picture Your Plate Total Score on Admission 60      Picture Your Plate Scores: <40 Unhealthy dietary pattern with much room for improvement. 41-50 Dietary pattern unlikely to meet recommendations for good health and room for improvement. 51-60 More healthful dietary pattern, with some room for improvement.  >60 Healthy dietary pattern, although there may be some specific behaviors that could be improved.    Nutrition Goals Re-Evaluation:   Nutrition Goals Discharge (Final Nutrition Goals Re-Evaluation):   Psychosocial: Target Goals: Acknowledge presence or absence of significant depression and/or stress, maximize coping skills, provide positive support system. Participant is able to verbalize types and ability to use techniques and skills needed for reducing stress and depression.   Education: Stress, Anxiety, and Depression - Group verbal and visual presentation to define topics covered.  Reviews how body is impacted by stress, anxiety, and depression.  Also discusses healthy ways to reduce stress and to treat/manage anxiety and depression.  Written material given at graduation.   Education: Sleep Hygiene -Provides group verbal and written instruction about how sleep can affect your health.  Define sleep hygiene, discuss sleep cycles and impact of sleep habits. Review good sleep hygiene tips.    Initial Review & Psychosocial Screening:  Initial Psych Review & Screening - 04/03/23 1013       Initial Review   Current issues with None Identified;Current Stress Concerns    Source of Stress Concerns Chronic Illness      Family Dynamics   Comments He can look to his wife for support. He states no  depression or anxiety and does not take anything for his mood.      Barriers   Psychosocial barriers to participate in program The patient should benefit from training in stress management and relaxation.      Screening Interventions   Interventions To provide support and resources with identified psychosocial needs;Provide feedback about the scores to participant;Encouraged to exercise    Expected Outcomes Short Term goal: Utilizing psychosocial counselor, staff and physician to assist with identification of specific Stressors or current issues interfering with healing process. Setting desired goal for each stressor or current issue identified.;Long Term Goal: Stressors or current issues are controlled or eliminated.;Short Term goal: Identification and review with participant of any Quality of Life or Depression concerns found by scoring the questionnaire.;Long Term goal: The participant improves quality of Life and PHQ9 Scores as seen by post scores and/or verbalization of changes             Quality of Life Scores:   Quality of Life - 04/10/23 1411       Quality of Life   Select Quality of Life      Quality of Life Scores   Health/Function Pre 24.97 %    Socioeconomic Pre 18.93 %    Psych/Spiritual Pre 22.93 %    Family Pre 23.8 %    GLOBAL Pre 23.13 %            Scores of 19 and below usually indicate a  poorer quality of life in these areas.  A difference of  2-3 points is a clinically meaningful difference.  A difference of 2-3 points in the total score of the Quality of Life Index has been associated with significant improvement in overall quality of life, self-image, physical symptoms, and general health in studies assessing change in quality of life.  PHQ-9: Review Flowsheet  More data exists      04/10/2023 01/03/2023 12/24/2022 08/06/2022 05/07/2022  Depression screen PHQ 2/9  Decreased Interest 2 2 1  0 1  Down, Depressed, Hopeless 2 2 2  0 1  PHQ - 2 Score 4 4 3  0 2   Altered sleeping 0 1 0 - 0  Tired, decreased energy 2 3 3  - 2  Change in appetite 0 1 1 - 0  Feeling bad or failure about yourself  2 3 2  - 0  Trouble concentrating 1 1 2  - 0  Moving slowly or fidgety/restless 2 2 3  - 0  Suicidal thoughts 0 1 1 - 0  PHQ-9 Score 11 16 15  - 4  Difficult doing work/chores Somewhat difficult Somewhat difficult Somewhat difficult - Not difficult at all   Interpretation of Total Score  Total Score Depression Severity:  1-4 = Minimal depression, 5-9 = Mild depression, 10-14 = Moderate depression, 15-19 = Moderately severe depression, 20-27 = Severe depression   Psychosocial Evaluation and Intervention:  Psychosocial Evaluation - 04/03/23 1013       Psychosocial Evaluation & Interventions   Interventions Relaxation education;Encouraged to exercise with the program and follow exercise prescription;Stress management education    Comments He can look to his wife for support. He states no depression or anxiety and does not take anything for his mood.    Expected Outcomes Short: Start [facility] to help with mood. Long: Maintain a healthy mental state    Continue Psychosocial Services  Follow up required by staff             Psychosocial Re-Evaluation:   Psychosocial Discharge (Final Psychosocial Re-Evaluation):   Vocational Rehabilitation: Provide vocational rehab assistance to qualifying candidates.   Vocational Rehab Evaluation & Intervention:   Education: Education Goals: Education classes will be provided on a variety of topics geared toward better understanding of heart health and risk factor modification. Participant will state understanding/return demonstration of topics presented as noted by education test scores.  Learning Barriers/Preferences:  Learning Barriers/Preferences - 04/03/23 1051       Learning Barriers/Preferences   Learning Barriers Hearing    Learning Preferences None             General Cardiac Education  Topics:  AED/CPR: - Group verbal and written instruction with the use of models to demonstrate the basic use of the AED with the basic ABC's of resuscitation.   Anatomy and Cardiac Procedures: - Group verbal and visual presentation and models provide information about basic cardiac anatomy and function. Reviews the testing methods done to diagnose heart disease and the outcomes of the test results. Describes the treatment choices: Medical Management, Angioplasty, or Coronary Bypass Surgery for treating various heart conditions including Myocardial Infarction, Angina, Valve Disease, and Cardiac Arrhythmias.  Written material given at graduation. Flowsheet Row Cardiac Rehab from 04/10/2023 in [facility] Cardiac and Pulmonary Rehab  Education need identified 04/10/23       Medication Safety: - Group verbal and visual instruction to review commonly prescribed medications for heart and lung disease. Reviews the medication, class of the drug, and side effects. Includes the steps  to properly store meds and maintain the prescription regimen.  Written material given at graduation.   Intimacy: - Group verbal instruction through game format to discuss how heart and lung disease can affect sexual intimacy. Written material given at graduation..   Know Your Numbers and Heart Failure: - Group verbal and visual instruction to discuss disease risk factors for cardiac and pulmonary disease and treatment options.  Reviews associated critical values for Overweight/Obesity, Hypertension, Cholesterol, and Diabetes.  Discusses basics of heart failure: signs/symptoms and treatments.  Introduces Heart Failure Zone chart for action plan for heart failure.  Written material given at graduation. Flowsheet Row Cardiac Rehab from 04/10/2023 in [facility] Cardiac and Pulmonary Rehab  Education need identified 04/10/23       Infection Prevention: - Provides verbal and written material to individual with discussion of infection  control including proper hand washing and proper equipment cleaning during exercise session. Flowsheet Row Cardiac Rehab from 04/10/2023 in [facility] Cardiac and Pulmonary Rehab  Date 04/03/23  Educator [facility]  Instruction Review Code 1- Verbalizes Understanding       Falls Prevention: - Provides verbal and written material to individual with discussion of falls prevention and safety. Flowsheet Row Cardiac Rehab from 04/10/2023 in [facility] Cardiac and Pulmonary Rehab  Date 04/03/23  Educator [facility]  Instruction Review Code 1- Verbalizes Understanding       Other: -Provides group and verbal instruction on various topics (see comments)   Knowledge Questionnaire Score:  Knowledge Questionnaire Score - 04/10/23 1401       Knowledge Questionnaire Score   Pre Score 18/26             Core Components/Risk Factors/Patient Goals at Admission:  Personal Goals and Risk Factors at Admission - 04/10/23 1412       Core Components/Risk Factors/Patient Goals on Admission    Weight Management Yes;Weight Loss    Intervention Weight Management: Provide education and appropriate resources to help participant work on and attain dietary goals.;Weight Management: Develop a combined nutrition and exercise program designed to reach desired caloric intake, while maintaining appropriate intake of nutrient and fiber, sodium and fats, and appropriate energy expenditure required for the weight goal.;Weight Management/Obesity: Establish reasonable short term and long term weight goals.;Obesity: Provide education and appropriate resources to help participant work on and attain dietary goals.    Admit Weight 188 lb 14.4 oz (85.7 kg)    Goal Weight: Short Term 183 lb (83 kg)    Goal Weight: Long Term 178 lb (80.7 kg)    Expected Outcomes Short Term: Continue to assess and modify interventions until short term weight is achieved;Long Term: Adherence to nutrition and physical activity/exercise program aimed toward attainment  of established weight goal;Weight Loss: Understanding of general recommendations for a balanced deficit meal plan, which promotes 1-2 lb weight loss per week and includes a negative energy balance of [phone] kcal/d;Understanding recommendations for meals to include 15-35% energy as protein, 25-35% energy from fat, 35-60% energy from carbohydrates, less than 200mg  of dietary cholesterol, 20-35 gm of total fiber daily;Understanding of distribution of calorie intake throughout the day with the consumption of 4-5 meals/snacks    Diabetes Yes    Intervention Provide education about signs/symptoms and action to take for hypo/hyperglycemia.;Provide education about proper nutrition, including hydration, and aerobic/resistive exercise prescription along with prescribed medications to achieve blood glucose in normal ranges: Fasting glucose 65-99 mg/dL    Expected Outcomes Long Term: Attainment of HbA1C < 7%.;Short Term: Participant verbalizes understanding of the signs/symptoms  and immediate care of hyper/hypoglycemia, proper foot care and importance of medication, aerobic/resistive exercise and nutrition plan for blood glucose control.    Heart Failure Yes    Intervention Provide a combined exercise and nutrition program that is supplemented with education, support and counseling about heart failure. Directed toward relieving symptoms such as shortness of breath, decreased exercise tolerance, and extremity edema.    Expected Outcomes Improve functional capacity of life;Short term: Attendance in program 2-3 days a week with increased exercise capacity. Reported lower sodium intake. Reported increased fruit and vegetable intake. Reports medication compliance.;Short term: Daily weights obtained and reported for increase. Utilizing diuretic protocols set by physician.;Long term: Adoption of self-care skills and reduction of barriers for early signs and symptoms recognition and intervention leading to self-care  maintenance.    Hypertension Yes    Intervention Provide education on lifestyle modifcations including regular physical activity/exercise, weight management, moderate sodium restriction and increased consumption of fresh fruit, vegetables, and low fat dairy, alcohol moderation, and smoking cessation.;Monitor prescription use compliance.    Expected Outcomes Short Term: Continued assessment and intervention until BP is < 140/64mm HG in hypertensive participants. < 130/40mm HG in hypertensive participants with diabetes, heart failure or chronic kidney disease.;Long Term: Maintenance of blood pressure at goal levels.    Lipids Yes    Intervention Provide education and support for participant on nutrition & aerobic/resistive exercise along with prescribed medications to achieve LDL 70mg , HDL >40mg .    Expected Outcomes Short Term: Participant states understanding of desired cholesterol values and is compliant with medications prescribed. Participant is following exercise prescription and nutrition guidelines.;Long Term: Cholesterol controlled with medications as prescribed, with individualized exercise RX and with personalized nutrition plan. Value goals: LDL < 70mg , HDL > 40 mg.             Education:Diabetes - Individual verbal and written instruction to review signs/symptoms of diabetes, desired ranges of glucose level fasting, after meals and with exercise. Acknowledge that pre and post exercise glucose checks will be done for 3 sessions at entry of program. Flowsheet Row Cardiac Rehab from 04/10/2023 in [facility] Cardiac and Pulmonary Rehab  Date 04/03/23  Educator [facility]  Instruction Review Code 1- Verbalizes Understanding       Core Components/Risk Factors/Patient Goals Review:    Core Components/Risk Factors/Patient Goals at Discharge (Final Review):    ITP Comments:  ITP Comments     Row Name 04/03/23 1038 04/10/23 1357         ITP Comments Visit completed. Patient informed on EP and  RD appointment and 6 Minute walk test. Patient also informed of patient health questionnaires on My Chart. Patient Verbalizes understanding. Visit diagnosis can be found in [facility] 02/25/2023. Patient came in with mild shortness of breath. He states it was the walk down from his car. Checked patients oxygen 94% and heart rate 63. He managed to get his breath back a little with pursed lipped breathing. Ask the patient a few more questions and it was clear his shortness of breath was not getting better. He was willing to go the [place] walk in clinic. When transitioning him to a different wheel chair he was more short of breath and oxygen saturations were going into the 80s. Informed staff that he was feeling sick the past few days and states his weight has increased. Staff at [facility] took him to the ER. Completed and gym orientation. Initial ITP created and sent for review to Dr. Bethann Punches, Medical Director.  Comments: Initial ITP

## 2023-04-14 ENCOUNTER — Encounter: Payer: Medicare Other | Admitting: *Deleted

## 2023-04-14 DIAGNOSIS — I5022 Chronic systolic (congestive) heart failure: Secondary | ICD-10-CM | POA: Diagnosis not present

## 2023-04-14 DIAGNOSIS — Z5189 Encounter for other specified aftercare: Secondary | ICD-10-CM | POA: Insufficient documentation

## 2023-04-14 NOTE — Progress Notes (Signed)
Daily Session Note  Patient Details  Name: Taylor [name] MRN: [id number] Date of Birth: [birth year] Referring Provider:   Flowsheet Row Cardiac Rehab from 04/10/2023 in [facility] Cardiac and Pulmonary Rehab  Referring Provider Dr. Marcina Millard, MD       Encounter Date: 04/14/2023  Check In:  Session Check In - 04/14/23 1033       Check-In   Supervising physician immediately available to respond to emergencies See telemetry face sheet for immediately available ER MD    Location [facility]-Cardiac & Pulmonary Rehab    Staff Present Lanny Hurst, RN, Franki Monte, BS, ACSM CEP, Exercise Physiologist;Noah Tickle, BS, Exercise Physiologist    Virtual Visit No    Medication changes reported     No    Fall or balance concerns reported    No    Warm-up and Cool-down Performed on first and last piece of equipment    Resistance Training Performed Yes    VAD Patient? No    PAD/SET Patient? No      Pain Assessment   Currently in Pain? No/denies                Social History   Tobacco Use  Smoking Status Former   Packs/day: 1.00   Years: 30.00   Additional pack years: 0.00   Total pack years: 30.00   Types: Cigarettes   Quit date: 11/26/1999   Years since quitting: 23.3  Smokeless Tobacco Never    Goals Met:  Independence with exercise equipment Exercise tolerated well No report of concerns or symptoms today Strength training completed today  Goals Unmet:  Not Applicable  Comments: First full day of exercise!  Patient was oriented to gym and equipment including functions, settings, policies, and procedures.  Patient's individual exercise prescription and treatment plan were reviewed.  All starting workloads were established based on the results of the 6 minute walk test done at initial orientation visit.  The plan for exercise progression was also introduced and progression will be customized based on patient's performance and goals.    Dr. Bethann Punches is Medical  Director for [facility] Cardiac Rehabilitation.  Dr. Vida Rigger is Medical Director for [facility] Pulmonary Rehabilitation.

## 2023-04-16 ENCOUNTER — Encounter: Payer: Medicare Other | Admitting: *Deleted

## 2023-04-16 ENCOUNTER — Telehealth: Payer: Self-pay | Admitting: Family

## 2023-04-16 ENCOUNTER — Ambulatory Visit: Payer: Medicare Other | Admitting: Family

## 2023-04-16 DIAGNOSIS — I5022 Chronic systolic (congestive) heart failure: Secondary | ICD-10-CM

## 2023-04-16 DIAGNOSIS — Z5189 Encounter for other specified aftercare: Secondary | ICD-10-CM | POA: Diagnosis not present

## 2023-04-16 NOTE — Telephone Encounter (Signed)
Patient did not show for his Heart Failure Clinic appointment on 04/16/23

## 2023-04-16 NOTE — Progress Notes (Signed)
Daily Session Note  Patient Details  Name: Taylor [name] MRN: [id number] Date of Birth: 08/17/1941 Referring Provider:   Flowsheet Row Cardiac Rehab from 04/10/2023 in [facility] Cardiac and Pulmonary Rehab  Referring Provider Dr. Marcina Millard, MD       Encounter Date: 04/16/2023  Check In:  Session Check In - 04/16/23 1031       Check-In   Supervising physician immediately available to respond to emergencies See telemetry face sheet for immediately available ER MD    Location [facility]-Cardiac & Pulmonary Rehab    Staff Present Lanny Hurst, RN, ADN;Krista Karleen Hampshire RN, BSN;Joseph Hood, RCP,RRT,BSRT;Noah Tickle, BS, Exercise Physiologist    Virtual Visit No    Medication changes reported     No    Fall or balance concerns reported    No    Warm-up and Cool-down Performed on first and last piece of equipment    Resistance Training Performed Yes    VAD Patient? No    PAD/SET Patient? No      Pain Assessment   Currently in Pain? No/denies                Social History   Tobacco Use  Smoking Status Former   Packs/day: 1.00   Years: 30.00   Additional pack years: 0.00   Total pack years: 30.00   Types: Cigarettes   Quit date: 11/26/1999   Years since quitting: 23.4  Smokeless Tobacco Never    Goals Met:  Independence with exercise equipment Exercise tolerated well No report of concerns or symptoms today Strength training completed today  Goals Unmet:  Not Applicable  Comments: Pt able to follow exercise prescription today without complaint.  Will continue to monitor for progression.    Dr. Bethann Punches is Medical Director for [facility] Cardiac Rehabilitation.  Dr. Vida Rigger is Medical Director for [facility] Pulmonary Rehabilitation.

## 2023-04-17 DIAGNOSIS — I10 Essential (primary) hypertension: Secondary | ICD-10-CM | POA: Diagnosis not present

## 2023-04-17 DIAGNOSIS — T82898A Other specified complication of vascular prosthetic devices, implants and grafts, initial encounter: Secondary | ICD-10-CM | POA: Diagnosis not present

## 2023-04-17 DIAGNOSIS — I25119 Atherosclerotic heart disease of native coronary artery with unspecified angina pectoris: Secondary | ICD-10-CM | POA: Diagnosis not present

## 2023-04-17 DIAGNOSIS — E1122 Type 2 diabetes mellitus with diabetic chronic kidney disease: Secondary | ICD-10-CM | POA: Diagnosis not present

## 2023-04-17 DIAGNOSIS — N184 Chronic kidney disease, stage 4 (severe): Secondary | ICD-10-CM | POA: Diagnosis not present

## 2023-04-22 DIAGNOSIS — N261 Atrophy of kidney (terminal): Secondary | ICD-10-CM | POA: Diagnosis not present

## 2023-04-22 DIAGNOSIS — Z95810 Presence of automatic (implantable) cardiac defibrillator: Secondary | ICD-10-CM | POA: Diagnosis not present

## 2023-04-22 DIAGNOSIS — I25119 Atherosclerotic heart disease of native coronary artery with unspecified angina pectoris: Secondary | ICD-10-CM | POA: Diagnosis not present

## 2023-04-22 DIAGNOSIS — E1122 Type 2 diabetes mellitus with diabetic chronic kidney disease: Secondary | ICD-10-CM | POA: Diagnosis not present

## 2023-04-22 DIAGNOSIS — T82898A Other specified complication of vascular prosthetic devices, implants and grafts, initial encounter: Secondary | ICD-10-CM | POA: Diagnosis not present

## 2023-04-22 DIAGNOSIS — N184 Chronic kidney disease, stage 4 (severe): Secondary | ICD-10-CM | POA: Diagnosis not present

## 2023-04-22 DIAGNOSIS — L408 Other psoriasis: Secondary | ICD-10-CM | POA: Diagnosis not present

## 2023-04-22 DIAGNOSIS — I10 Essential (primary) hypertension: Secondary | ICD-10-CM | POA: Diagnosis not present

## 2023-04-22 DIAGNOSIS — M1000 Idiopathic gout, unspecified site: Secondary | ICD-10-CM | POA: Diagnosis not present

## 2023-04-22 DIAGNOSIS — U071 COVID-19: Secondary | ICD-10-CM | POA: Diagnosis not present

## 2023-04-22 DIAGNOSIS — E7800 Pure hypercholesterolemia, unspecified: Secondary | ICD-10-CM | POA: Diagnosis not present

## 2023-04-22 DIAGNOSIS — I509 Heart failure, unspecified: Secondary | ICD-10-CM | POA: Diagnosis not present

## 2023-04-23 ENCOUNTER — Other Ambulatory Visit: Payer: Self-pay

## 2023-04-23 ENCOUNTER — Telehealth: Payer: Self-pay

## 2023-04-23 ENCOUNTER — Encounter: Payer: Self-pay | Admitting: Emergency Medicine

## 2023-04-23 ENCOUNTER — Encounter: Payer: Medicare Other | Admitting: *Deleted

## 2023-04-23 ENCOUNTER — Emergency Department: Payer: Medicare Other

## 2023-04-23 ENCOUNTER — Inpatient Hospital Stay
Admission: EM | Admit: 2023-04-23 | Discharge: 2023-04-29 | DRG: 291 | Disposition: A | Discharge status: Home Health | Payer: Medicare Other | Attending: Internal Medicine | Admitting: Internal Medicine

## 2023-04-23 DIAGNOSIS — R0609 Other forms of dyspnea: Secondary | ICD-10-CM | POA: Diagnosis not present

## 2023-04-23 DIAGNOSIS — Z8551 Personal history of malignant neoplasm of bladder: Secondary | ICD-10-CM

## 2023-04-23 DIAGNOSIS — I129 Hypertensive chronic kidney disease with stage 1 through stage 4 chronic kidney disease, or unspecified chronic kidney disease: Secondary | ICD-10-CM | POA: Diagnosis not present

## 2023-04-23 DIAGNOSIS — Z85820 Personal history of malignant melanoma of skin: Secondary | ICD-10-CM

## 2023-04-23 DIAGNOSIS — I130 Hypertensive heart and chronic kidney disease with heart failure and stage 1 through stage 4 chronic kidney disease, or unspecified chronic kidney disease: Secondary | ICD-10-CM | POA: Diagnosis not present

## 2023-04-23 DIAGNOSIS — K7460 Unspecified cirrhosis of liver: Secondary | ICD-10-CM | POA: Diagnosis not present

## 2023-04-23 DIAGNOSIS — Z808 Family history of malignant neoplasm of other organs or systems: Secondary | ICD-10-CM

## 2023-04-23 DIAGNOSIS — Z8679 Personal history of other diseases of the circulatory system: Secondary | ICD-10-CM

## 2023-04-23 DIAGNOSIS — W1830XA Fall on same level, unspecified, initial encounter: Secondary | ICD-10-CM | POA: Diagnosis present

## 2023-04-23 DIAGNOSIS — Y92512 Supermarket, store or market as the place of occurrence of the external cause: Secondary | ICD-10-CM | POA: Diagnosis not present

## 2023-04-23 DIAGNOSIS — R079 Chest pain, unspecified: Secondary | ICD-10-CM | POA: Diagnosis not present

## 2023-04-23 DIAGNOSIS — R188 Other ascites: Secondary | ICD-10-CM | POA: Diagnosis present

## 2023-04-23 DIAGNOSIS — J441 Chronic obstructive pulmonary disease with (acute) exacerbation: Secondary | ICD-10-CM | POA: Diagnosis present

## 2023-04-23 DIAGNOSIS — Z95810 Presence of automatic (implantable) cardiac defibrillator: Secondary | ICD-10-CM | POA: Diagnosis not present

## 2023-04-23 DIAGNOSIS — I2510 Atherosclerotic heart disease of native coronary artery without angina pectoris: Secondary | ICD-10-CM | POA: Diagnosis not present

## 2023-04-23 DIAGNOSIS — J810 Acute pulmonary edema: Secondary | ICD-10-CM | POA: Diagnosis not present

## 2023-04-23 DIAGNOSIS — Z8261 Family history of arthritis: Secondary | ICD-10-CM

## 2023-04-23 DIAGNOSIS — Z8673 Personal history of transient ischemic attack (TIA), and cerebral infarction without residual deficits: Secondary | ICD-10-CM

## 2023-04-23 DIAGNOSIS — E7800 Pure hypercholesterolemia, unspecified: Secondary | ICD-10-CM | POA: Diagnosis present

## 2023-04-23 DIAGNOSIS — Z951 Presence of aortocoronary bypass graft: Secondary | ICD-10-CM

## 2023-04-23 DIAGNOSIS — Z961 Presence of intraocular lens: Secondary | ICD-10-CM | POA: Diagnosis present

## 2023-04-23 DIAGNOSIS — I480 Paroxysmal atrial fibrillation: Secondary | ICD-10-CM | POA: Diagnosis not present

## 2023-04-23 DIAGNOSIS — R601 Generalized edema: Principal | ICD-10-CM

## 2023-04-23 DIAGNOSIS — J9601 Acute respiratory failure with hypoxia: Secondary | ICD-10-CM | POA: Diagnosis present

## 2023-04-23 DIAGNOSIS — I340 Nonrheumatic mitral (valve) insufficiency: Secondary | ICD-10-CM | POA: Diagnosis not present

## 2023-04-23 DIAGNOSIS — I10 Essential (primary) hypertension: Secondary | ICD-10-CM | POA: Diagnosis not present

## 2023-04-23 DIAGNOSIS — K760 Fatty (change of) liver, not elsewhere classified: Secondary | ICD-10-CM | POA: Diagnosis not present

## 2023-04-23 DIAGNOSIS — N2581 Secondary hyperparathyroidism of renal origin: Secondary | ICD-10-CM | POA: Diagnosis present

## 2023-04-23 DIAGNOSIS — Z79899 Other long term (current) drug therapy: Secondary | ICD-10-CM | POA: Diagnosis not present

## 2023-04-23 DIAGNOSIS — S0990XA Unspecified injury of head, initial encounter: Secondary | ICD-10-CM

## 2023-04-23 DIAGNOSIS — N184 Chronic kidney disease, stage 4 (severe): Secondary | ICD-10-CM | POA: Diagnosis present

## 2023-04-23 DIAGNOSIS — N179 Acute kidney failure, unspecified: Secondary | ICD-10-CM | POA: Diagnosis not present

## 2023-04-23 DIAGNOSIS — I5043 Acute on chronic combined systolic (congestive) and diastolic (congestive) heart failure: Secondary | ICD-10-CM | POA: Diagnosis not present

## 2023-04-23 DIAGNOSIS — I252 Old myocardial infarction: Secondary | ICD-10-CM

## 2023-04-23 DIAGNOSIS — I5022 Chronic systolic (congestive) heart failure: Secondary | ICD-10-CM | POA: Diagnosis not present

## 2023-04-23 DIAGNOSIS — W19XXXA Unspecified fall, initial encounter: Secondary | ICD-10-CM | POA: Diagnosis not present

## 2023-04-23 DIAGNOSIS — R54 Age-related physical debility: Secondary | ICD-10-CM | POA: Diagnosis present

## 2023-04-23 DIAGNOSIS — I110 Hypertensive heart disease with heart failure: Secondary | ICD-10-CM | POA: Diagnosis not present

## 2023-04-23 DIAGNOSIS — R140 Abdominal distension (gaseous): Secondary | ICD-10-CM

## 2023-04-23 DIAGNOSIS — Z7901 Long term (current) use of anticoagulants: Secondary | ICD-10-CM | POA: Diagnosis not present

## 2023-04-23 DIAGNOSIS — S41111A Laceration without foreign body of right upper arm, initial encounter: Secondary | ICD-10-CM | POA: Diagnosis present

## 2023-04-23 DIAGNOSIS — I509 Heart failure, unspecified: Secondary | ICD-10-CM | POA: Diagnosis not present

## 2023-04-23 DIAGNOSIS — S0093XA Contusion of unspecified part of head, initial encounter: Secondary | ICD-10-CM | POA: Diagnosis present

## 2023-04-23 DIAGNOSIS — Z87891 Personal history of nicotine dependence: Secondary | ICD-10-CM

## 2023-04-23 DIAGNOSIS — Z8249 Family history of ischemic heart disease and other diseases of the circulatory system: Secondary | ICD-10-CM

## 2023-04-23 DIAGNOSIS — Z8616 Personal history of COVID-19: Secondary | ICD-10-CM | POA: Diagnosis not present

## 2023-04-23 DIAGNOSIS — I25119 Atherosclerotic heart disease of native coronary artery with unspecified angina pectoris: Secondary | ICD-10-CM | POA: Diagnosis present

## 2023-04-23 DIAGNOSIS — Z7982 Long term (current) use of aspirin: Secondary | ICD-10-CM

## 2023-04-23 DIAGNOSIS — Z8674 Personal history of sudden cardiac arrest: Secondary | ICD-10-CM

## 2023-04-23 DIAGNOSIS — Z888 Allergy status to other drugs, medicaments and biological substances status: Secondary | ICD-10-CM

## 2023-04-23 DIAGNOSIS — M25511 Pain in right shoulder: Secondary | ICD-10-CM | POA: Diagnosis not present

## 2023-04-23 DIAGNOSIS — M47812 Spondylosis without myelopathy or radiculopathy, cervical region: Secondary | ICD-10-CM | POA: Diagnosis not present

## 2023-04-23 DIAGNOSIS — I2489 Other forms of acute ischemic heart disease: Secondary | ICD-10-CM | POA: Diagnosis present

## 2023-04-23 DIAGNOSIS — G4733 Obstructive sleep apnea (adult) (pediatric): Secondary | ICD-10-CM | POA: Diagnosis present

## 2023-04-23 DIAGNOSIS — Z7984 Long term (current) use of oral hypoglycemic drugs: Secondary | ICD-10-CM

## 2023-04-23 DIAGNOSIS — I4891 Unspecified atrial fibrillation: Secondary | ICD-10-CM | POA: Diagnosis present

## 2023-04-23 DIAGNOSIS — J449 Chronic obstructive pulmonary disease, unspecified: Secondary | ICD-10-CM | POA: Diagnosis present

## 2023-04-23 DIAGNOSIS — Z9841 Cataract extraction status, right eye: Secondary | ICD-10-CM

## 2023-04-23 DIAGNOSIS — I25810 Atherosclerosis of coronary artery bypass graft(s) without angina pectoris: Secondary | ICD-10-CM | POA: Diagnosis not present

## 2023-04-23 DIAGNOSIS — J90 Pleural effusion, not elsewhere classified: Secondary | ICD-10-CM | POA: Diagnosis not present

## 2023-04-23 DIAGNOSIS — Z9842 Cataract extraction status, left eye: Secondary | ICD-10-CM

## 2023-04-23 DIAGNOSIS — Z9861 Coronary angioplasty status: Secondary | ICD-10-CM | POA: Diagnosis not present

## 2023-04-23 DIAGNOSIS — I700 Atherosclerosis of aorta: Secondary | ICD-10-CM | POA: Diagnosis present

## 2023-04-23 DIAGNOSIS — E1122 Type 2 diabetes mellitus with diabetic chronic kidney disease: Secondary | ICD-10-CM | POA: Diagnosis present

## 2023-04-23 DIAGNOSIS — Z955 Presence of coronary angioplasty implant and graft: Secondary | ICD-10-CM

## 2023-04-23 DIAGNOSIS — S0081XA Abrasion of other part of head, initial encounter: Secondary | ICD-10-CM | POA: Diagnosis present

## 2023-04-23 DIAGNOSIS — Z743 Need for continuous supervision: Secondary | ICD-10-CM | POA: Diagnosis not present

## 2023-04-23 LAB — CBC WITH DIFFERENTIAL/PLATELET
Abs Immature Granulocytes: 0.02 10*3/uL (ref 0.00–0.07)
Basophils Absolute: 0 10*3/uL (ref 0.0–0.1)
Basophils Relative: 1 %
Eosinophils Absolute: 0.1 10*3/uL (ref 0.0–0.5)
Eosinophils Relative: 2 %
HCT: 39.9 % (ref 39.0–52.0)
Hemoglobin: 11.8 g/dL — ABNORMAL LOW (ref 13.0–17.0)
Immature Granulocytes: 1 %
Lymphocytes Relative: 25 %
Lymphs Abs: 1.1 10*3/uL (ref 0.7–4.0)
MCH: 29.7 pg (ref 26.0–34.0)
MCHC: 29.6 g/dL — ABNORMAL LOW (ref 30.0–36.0)
MCV: 100.5 fL — ABNORMAL HIGH (ref 80.0–100.0)
Monocytes Absolute: 0.6 10*3/uL (ref 0.1–1.0)
Monocytes Relative: 13 %
Neutro Abs: 2.6 10*3/uL (ref 1.7–7.7)
Neutrophils Relative %: 58 %
Platelets: 110 10*3/uL — ABNORMAL LOW (ref 150–400)
RBC: 3.97 MIL/uL — ABNORMAL LOW (ref 4.22–5.81)
RDW: 19.2 % — ABNORMAL HIGH (ref 11.5–15.5)
WBC: 4.3 10*3/uL (ref 4.0–10.5)
nRBC: 0 % (ref 0.0–0.2)

## 2023-04-23 LAB — COMPREHENSIVE METABOLIC PANEL
ALT: 10 U/L (ref 0–44)
AST: 31 U/L (ref 15–41)
Albumin: 2.6 g/dL — ABNORMAL LOW (ref 3.5–5.0)
Alkaline Phosphatase: 140 U/L — ABNORMAL HIGH (ref 38–126)
Anion gap: 10 (ref 5–15)
BUN: 62 mg/dL — ABNORMAL HIGH (ref 8–23)
CO2: 29 mmol/L (ref 22–32)
Calcium: 8.6 mg/dL — ABNORMAL LOW (ref 8.9–10.3)
Chloride: 101 mmol/L (ref 98–111)
Creatinine, Ser: 3.17 mg/dL — ABNORMAL HIGH (ref 0.61–1.24)
GFR, Estimated: 19 mL/min — ABNORMAL LOW (ref >60)
Glucose, Bld: 91 mg/dL (ref 70–99)
Potassium: 3.6 mmol/L (ref 3.5–5.1)
Sodium: 140 mmol/L (ref 135–145)
Total Bilirubin: 2.3 mg/dL — ABNORMAL HIGH (ref 0.3–1.2)
Total Protein: 6.8 g/dL (ref 6.5–8.1)

## 2023-04-23 LAB — TROPONIN I (HIGH SENSITIVITY)
Troponin I (High Sensitivity): 20 ng/L — ABNORMAL HIGH (ref <18)
Troponin I (High Sensitivity): 19 ng/L — ABNORMAL HIGH (ref <18)

## 2023-04-23 LAB — BLOOD GAS, VENOUS
Acid-Base Excess: 5.2 mmol/L — ABNORMAL HIGH (ref 0.0–2.0)
Bicarbonate: 32.9 mmol/L — ABNORMAL HIGH (ref 20.0–28.0)
O2 Saturation: 22.6 %
Patient temperature: 37
pCO2, Ven: 61 mmHg — ABNORMAL HIGH (ref 44–60)
pH, Ven: 7.34 (ref 7.25–7.43)
pO2, Ven: <31 mmHg — CL (ref 32–45)

## 2023-04-23 LAB — BRAIN NATRIURETIC PEPTIDE: B Natriuretic Peptide: 1516.7 pg/mL — ABNORMAL HIGH (ref 0.0–100.0)

## 2023-04-23 LAB — PROCALCITONIN: Procalcitonin: 0.25 ng/mL

## 2023-04-23 MED ORDER — FUROSEMIDE 10 MG/ML IJ SOLN
80.0000 mg | Freq: Once | INTRAMUSCULAR | Status: AC
  Administered 2023-04-23: 80 mg via INTRAVENOUS
  Filled 2023-04-23: qty 8

## 2023-04-23 MED ORDER — ONDANSETRON HCL 4 MG/2ML IJ SOLN: 4.0000 mg | Freq: Four times a day (QID) | INTRAMUSCULAR | Status: AC | PRN

## 2023-04-23 MED ORDER — ALBUMIN HUMAN 25 % IV SOLN: 12.5000 g | Freq: Once | INTRAVENOUS | Status: DC | PRN

## 2023-04-23 MED ORDER — ISOSORBIDE MONONITRATE ER 30 MG PO TB24
15.0000 mg | ORAL_TABLET | Freq: Every day | ORAL | Status: DC
  Administered 2023-04-24 – 2023-04-29 (×6): 15 mg via ORAL
  Filled 2023-04-23 (×6): qty 1

## 2023-04-23 MED ORDER — ASPIRIN 81 MG PO TBEC
81.0000 mg | DELAYED_RELEASE_TABLET | Freq: Every day | ORAL | Status: DC
  Administered 2023-04-25 – 2023-04-29 (×5): 81 mg via ORAL
  Filled 2023-04-23 (×5): qty 1

## 2023-04-23 MED ORDER — HEPARIN SODIUM (PORCINE) 5000 UNIT/ML IJ SOLN: 5000.0000 [IU] | Freq: Three times a day (TID) | INTRAMUSCULAR | Status: DC

## 2023-04-23 MED ORDER — ATORVASTATIN CALCIUM 20 MG PO TABS
40.0000 mg | ORAL_TABLET | Freq: Every day | ORAL | Status: DC
  Administered 2023-04-24 – 2023-04-29 (×6): 40 mg via ORAL
  Filled 2023-04-23 (×6): qty 2

## 2023-04-23 MED ORDER — ONDANSETRON HCL 4 MG PO TABS: 4.0000 mg | ORAL_TABLET | Freq: Four times a day (QID) | ORAL | Status: AC | PRN

## 2023-04-23 MED ORDER — CARVEDILOL 6.25 MG PO TABS
6.2500 mg | ORAL_TABLET | Freq: Two times a day (BID) | ORAL | Status: DC
  Administered 2023-04-23 – 2023-04-29 (×10): 6.25 mg via ORAL
  Filled 2023-04-23 (×10): qty 1

## 2023-04-23 MED ORDER — FUROSEMIDE 10 MG/ML IJ SOLN
60.0000 mg | Freq: Two times a day (BID) | INTRAMUSCULAR | Status: AC
  Administered 2023-04-24 (×2): 60 mg via INTRAVENOUS
  Filled 2023-04-23 (×2): qty 8

## 2023-04-23 MED ORDER — ALLOPURINOL 100 MG PO TABS
150.0000 mg | ORAL_TABLET | Freq: Every day | ORAL | Status: DC
  Administered 2023-04-24 – 2023-04-29 (×5): 150 mg via ORAL
  Filled 2023-04-23: qty 2
  Filled 2023-04-23: qty 0.5
  Filled 2023-04-23 (×3): qty 2
  Filled 2023-04-23: qty 0.5

## 2023-04-23 MED ORDER — ACETAMINOPHEN 325 MG PO TABS
650.0000 mg | ORAL_TABLET | Freq: Four times a day (QID) | ORAL | Status: AC | PRN
  Administered 2023-04-23 – 2023-04-25 (×3): 650 mg via ORAL
  Filled 2023-04-23 (×3): qty 2

## 2023-04-23 MED ORDER — SENNOSIDES-DOCUSATE SODIUM 8.6-50 MG PO TABS: 1.0000 | ORAL_TABLET | Freq: Every evening | ORAL | Status: DC | PRN

## 2023-04-23 MED ORDER — PANTOPRAZOLE SODIUM 40 MG PO TBEC
40.0000 mg | DELAYED_RELEASE_TABLET | Freq: Every day | ORAL | Status: DC
  Administered 2023-04-24 – 2023-04-29 (×6): 40 mg via ORAL
  Filled 2023-04-23 (×6): qty 1

## 2023-04-23 MED ORDER — ACETAMINOPHEN 650 MG RE SUPP: 650.0000 mg | Freq: Four times a day (QID) | RECTAL | Status: AC | PRN

## 2023-04-23 MED ORDER — IPRATROPIUM-ALBUTEROL 0.5-2.5 (3) MG/3ML IN SOLN
3.0000 mL | RESPIRATORY_TRACT | Status: DC | PRN
  Administered 2023-04-23 – 2023-04-29 (×6): 3 mL via RESPIRATORY_TRACT
  Filled 2023-04-23 (×5): qty 3

## 2023-04-23 MED ORDER — FUROSEMIDE 10 MG/ML IJ SOLN
20.0000 mg | Freq: Once | INTRAMUSCULAR | Status: AC
  Administered 2023-04-23: 20 mg via INTRAVENOUS
  Filled 2023-04-23: qty 4

## 2023-04-23 NOTE — Progress Notes (Unsigned)
Care Management & Coordination Services Pharmacy Team  Reason for Encounter: Heart Failure  Contacted patient to discuss Heart Failure disease state. {US HC Outreach:[id number]}    Do you monitor your weight every day?   Have you noticed any changes in your weight or fluid status ( Greater than 2 pounds in one day; 5 pounds in one week) ?  How often are you taking your fluid pill (Furosemide, Torsemide, or Bumetanide) ?    Schedule appt with CPP  Adherence Review: Is the patient currently on ACE/ARB medication? No Does the patient have >5 day gap between last estimated fill dates? No  Star Rating Drugs:  Atorvastatin 40 mg Last Fill: 03/22/2023 90 Day Supply at [facility].  Jardiance 10 mg Last Fill: 03/26/2023 30 Day Supply at [facility].   Care Gaps: Dtap Vaccine Shingrix vaccine COVID-19 Vaccine Ophthalmology Exam  Chart Updates: Recent office visits:  04/08/2023 Kemper Durie RN (Care Coordination) No Medication changes noted  03/19/2023 Kemper Durie RN (Care Coordination) No Medication changes noted  03/05/2023 Dr. Sherrie Mustache MD (PCP) No Medication changes noted 02/28/2023 Juanell Fairly RN (PCP Office) No medication changes noted 02/24/2023 Dr. Sherrie Mustache MD (PCP) No Medication changes noted  02/17/2023  Kemper Durie RN (Care Coordination) No Medication changes noted  02/14/2023 Dr. Sherrie Mustache MD (PCP) Changes Atorvastatin to every other day while on antibiotic, and start Hydrocodone/apap 5-325 mg 02/14/2023  Kemper Durie RN (Care Coordination) No Medication changes noted  01/29/2023  Kemper Durie RN (Care Coordination) No Medication changes noted  01/20/2023 Kemper Durie RN (Care Coordination) No Medication changes noted  01/17/2023 Kemper Durie RN (Care Coordination) No Medication changes noted  01/14/2023 Kemper Durie RN (Care Coordination) No Medication changes noted  01/07/2023 Kemper Durie RN (Care Coordination) No Medication changes noted  01/03/2023 Dr. Sherrie Mustache MD  (PCP) start albuterol 108 MCG/ACT PRN, start azithromycin 250 mg , start Prednisone 10 mg taper dose. 01/01/2023 Kemper Durie RN (Care Coordination) No Medication changes noted  12/25/2022 Kemper Durie RN (Care Coordination) No Medication changes noted  12/24/2022 Dr. Sherrie Mustache MD (PCP) No medication changes noted  Recent consult visits:  04/23/2023 Lanny Hurst RN [facility] Cardiac and Pulmonary Rehab) No Medication changes noted 04/22/2023 Dr. Suezanne Jacquet MD (Nephrology) No Medication changes noted, return in 4 weeks 04/16/2023 Lanny Hurst RN [facility] Cardiac and Pulmonary Rehab) No Medication changes noted 04/14/2023 Lanny Hurst RN [facility] Cardiac and Pulmonary Rehab) No Medication changes noted 04/10/2023 [facility] Cardiac and Pulmonary Rehab) No Medication changes noted 04/03/2023 Marvis Moeller DO (Internal medicine) No Medication Changes noted 04/03/2023 Elige Ko RRT [facility] Cardiac and Pulmonary Rehab) No Medication changes noted 03/24/2023 Dr. Darrold Junker MD (Cardiology) No Medication changes noted, follow up in 2 months 03/10/2023 Clarisa Kindred FNP (Cardiology) Decrease torsemide to 2 tablets in the morning and 2 tablets in the afternoon.  03/04/2023  Dr. Suezanne Jacquet MD (Nephrology) No Medication changes noted, return in 4 weeks 02/24/2023 Dr. Darrold Junker MD (Cardiology) start Metolazone 5 mg every morning x 3 days, to be taken 30 minutes before morning torsemide , follow up 3 weeks 02/17/2023 Nolon Rod MD (cardiology) change torsemide to 60 mg in AM and 40 mg in PM  01/29/2023 Dr. Edwena Blow MD (Cardiology) start Jardiance 10 mg daily  01/28/2023  Dr. Suezanne Jacquet MD (Nephrology) No Medication changes noted, return in 4 weeks 01/27/2023 Dr. Karna Christmas MD (Pulmonology) No medication changes noted 01/27/2023 Clarisa Kindred FNP (Cardiology) Increase your torsemide to 2 tablets in the morning and 2 tablets in the evening for the  next 3 days,  Return in about 3 months  01/13/2023 Minda Ditto PA   (Cardiology) No medication changes noted 12/31/2022 Dr. Suezanne Jacquet MD (Nephrology) No Medication changes noted, return in 4 weeks  Hospital visits:  Medication Reconciliation was completed by comparing discharge summary, patient's EMR and Pharmacy list, and upon discussion with patient.  Admitted to the hospital on 04/03/2023 due to Acute hypoxic respiratory failure . Discharge date was 04/06/2023. Discharged from [facility]    New?Medications Started at [facility] Discharge:?? -started None ID  Medication Changes at Hospital Discharge: -Changed Torsemide 40 mg every 12 hours.   Medications Discontinued at Hospital Discharge: -Stopped clarithromycin 500 MG   Medications that remain the same after Hospital Discharge:??  -All other medications will remain the same.   Admitted to the hospital on 02/25/2023 due to Acute Congestive Heart failure. Discharge date was 02/27/2023. Discharged from [facility].    New?Medications Started at [facility] Discharge:?? -started None ID  Medication Changes at Hospital Discharge: -Changed Carvedilol to 6.25 mg twice daily -Changed Torsemide 60 mg every morning and 40 mg every afternoon  Medications Discontinued at Hospital Discharge: -Stop Allopurinol -Stop Losartan -Stop Metolazone  Medications that remain the same after Hospital Discharge:??  -All other medications will remain the same.    Admitted to the hospital on 02/13/2023 due to Pacemaker Generator Change out. Discharge date was 02/13/2023. Discharged from [facility].    New?Medications Started at [facility] Discharge:?? -started clarithromycin   Medication Changes at Hospital Discharge: -Changed None ID  Medications Discontinued at Hospital Discharge: -Stopped None ID  Medications that remain the same after Hospital Discharge:??  -All other medications will remain the same.    Medications: Outpatient Encounter Medications as  of 04/23/2023  Medication Sig   albuterol (VENTOLIN HFA) 108 (90 Base) MCG/ACT inhaler Inhale 2 puffs into the lungs every 6 (six) hours as needed for wheezing or shortness of breath.   allopurinol (ZYLOPRIM) 300 MG tablet Take 150 mg by mouth daily.   apixaban (ELIQUIS) 5 MG TABS tablet Take 2.5 mg by mouth 2 (two) times daily.   aspirin 81 MG EC tablet Take 81 mg by mouth daily.    atorvastatin (LIPITOR) 40 MG tablet TAKE 1 TABLET(40 MG) BY MOUTH DAILY (Patient taking differently: Take 40 mg by mouth daily.)   carvedilol (COREG) 6.25 MG tablet Take 1 tablet (6.25 mg total) by mouth 2 (two) times daily with a meal.   empagliflozin (JARDIANCE) 10 MG TABS tablet Take 10 mg by mouth daily.   ipratropium-albuterol (DUONEB) 0.5-2.5 (3) MG/3ML SOLN Inhale 3 mLs into the lungs every 4 (four) hours as needed (wheezing, shob).   isosorbide mononitrate (IMDUR) 30 MG 24 hr tablet TAKE 1/2 TABLET(15 MG) BY MOUTH DAILY (Patient taking differently: Take 15 mg by mouth daily.)   Magnesium 200 MG TABS Take 1 tablet (200 mg total) by mouth in the morning and at bedtime.   pantoprazole (PROTONIX) 40 MG tablet TAKE 1 TABLET BY MOUTH EVERY DAY   potassium chloride SA (KLOR-CON M) 20 MEQ tablet Take 20 mEq by mouth daily.   torsemide (DEMADEX) 20 MG tablet 60 mg (3 tablets) every morning and 40 mg (2 tablets) every afternoon/evening (Patient taking differently: Take 40 mg by mouth 2 (two) times daily.)   triamcinolone ointment (KENALOG) 0.1 % APPLY TOPICALLY TWICE DAILY AS DIRECTED   No facility-administered encounter medications on file as of 04/23/2023.    Recent Office Vitals: BP Readings from Last  3 Encounters:  04/06/23 116/82  03/10/23 113/84  03/05/23 107/67   Pulse Readings from Last 3 Encounters:  04/06/23 62  03/10/23 67  03/05/23 60    Wt Readings from Last 3 Encounters:  04/10/23 188 lb 14.4 oz (85.7 kg)  04/06/23 183 lb 13.8 oz (83.4 kg)  03/10/23 174 lb 4 oz (79 kg)     Kidney  Function Lab Results  Component Value Date/Time   CREATININE 3.29 (H) 04/06/2023 05:10 AM   CREATININE 3.63 (H) 04/05/2023 04:44 AM   GFRNONAA 18 (L) 04/06/2023 05:10 AM   GFRAA 60 11/21/2020 03:05 PM       Latest Ref Rng & Units 04/06/2023    5:10 AM 04/05/2023    4:44 AM 04/04/2023    4:45 AM  BMP  Glucose 70 - 99 mg/dL 098  92  119   BUN 8 - 23 mg/dL 79  82  77   Creatinine 0.61 - 1.24 mg/dL 1.47  8.29  5.62   Sodium 135 - 145 mmol/L 137  136  134   Potassium 3.5 - 5.1 mmol/L 3.8  3.8  3.6   Chloride 98 - 111 mmol/L 101  103  101   CO2 22 - 32 mmol/L 26  26  25    Calcium 8.9 - 10.3 mg/dL 8.7  8.9  8.5      [facility] Clinical Pharmacist Assistant [phone]

## 2023-04-23 NOTE — Assessment & Plan Note (Signed)
Home Eliquis not resumed on admission due to recent fall with multiple skin lacerations, ecchymosis on the right arm and shoulder and ultrasound-guided paracentesis ordered on admission. -Restarting home Eliquis as hemoglobin seems stable

## 2023-04-23 NOTE — Assessment & Plan Note (Signed)
Patient had complete echo on 02/17/2023 which was read as: Severe LV and RV systolic dysfunction, estimated ejection fraction is 20%.  Mild AR, trivial PR, moderate TR. Strict I's and O's Status post furosemide 80 mg IV one-time dose per EDP I have ordered furosemide 60 mg IV twice daily for 5/30/104 Admit to telemetry cardiac, inpatient

## 2023-04-23 NOTE — Assessment & Plan Note (Addendum)
Strict I's and O's Patient had complete echo on 02/17/2023: Severe LV and RV systolic dysfunction, estimated ejection fraction 20%, mild AR, trivial PR, moderate TR Status post furosemide 80 mg IV one-time dose per EDP One-time dose of furosemide 20 mg IV ordered on admission Ordered furosemide 60 mg IV twice daily for 04/24/2023 Admit to telemetry cardiac, inpatient

## 2023-04-23 NOTE — H&P (Signed)
History and Physical   Taylor [name] ZOX:[id number] DOB: 07-31-1941 DOA: 04/23/2023  PCP: Malva Limes, MD  Outpatient Specialists: Dr. Gillermo Murdoch clinic cardiology Patient coming from: [facility] via EMS  I have personally briefly reviewed patient's old medical records in [facility] EMR.  Chief Concern: Larey Seat, shortness of breath, weight gain  HPI: [name] is a 82 year old male with combined heart failure, NYHA class III, SA node dysfunction, hyperlipidemia, CKD stage IV, OSA,, hepatic steatosis, history of abdominal ascites status post paracentesis, hypertension, atrial fibrillation, history of abdominal aortic aneurysm status post endovascular repair in 2008, CAD status post CABG and PCI, old left PCA territory, who presents to the emergency department for chief concerns of weight gain, shortness of breath, and falling at [facility].  Vitals in the ED showed temperature 98, respiration rate of 18, heart rate of 62, blood pressure 168/83, SpO2 of 96% on room air.  Serum sodium is 140, potassium 3.6, chloride 101, bicarb 29, BUN of 62, serum creatinine of 3.17, EGFR of 19, nonfasting blood glucose 91, WBC 4.3, hemoglobin 11.8, platelets of 110.  Alk phos was elevated at 140.  Albumin level was 2.6.  BNP was elevated 1516.7, high sensitive troponin was 20.  ED treatment: Furosemide 80 mg IV one-time dose --------------------------- At bedside, patient is awake alert and oriented to self, age, location, family were at bedside.  He reports that he was at [facility] and felt weak and then slid down onto his right side.  He endorses right-sided shoulder pain at this time.  He endorses shortness of breath with exertion.  Reports he is gained approximately 10 pounds in the last week.  He denies fever, chest pain, dysuria, hematuria, diarrhea.  He endorses baseline cough. He denies loss of consciousness.  Social history: He lives at home with his wife.  He  denies tobacco, EtOH, recreational drug use.  ROS: Constitutional: no weight change, no fever ENT/Mouth: no sore throat, no rhinorrhea Eyes: no eye pain, no vision changes Cardiovascular: no chest pain, + dyspnea,  no edema, no palpitations Respiratory: + cough, no sputum, no wheezing Gastrointestinal: no nausea, no vomiting, no diarrhea, no constipation Genitourinary: no urinary incontinence, no dysuria, no hematuria Musculoskeletal: no arthralgias, no myalgias Skin: + skin lesions, no pruritus, Neuro: + weakness, no loss of consciousness, no syncope Psych: no anxiety, no depression, no decrease appetite Heme/Lymph: no bruising, no bleeding  ED Course: Discussed with emergency medicine provider, patient requiring hospitalization for chief concerns of heart failure exacerbation.  Assessment/Plan  Principal Problem:   Acute on chronic combined systolic and diastolic CHF (congestive heart failure) (HCC) Active Problems:   Abdominal ascites   Chronic kidney disease, stage 4 (severe) (HCC)   CAD S/P percutaneous coronary angioplasty   Cardiac defibrillator in place   Hypercholesteremia   Obstructive sleep apnea   History of abdominal aortic aneurysm (AAA)   History of CVA (cerebrovascular accident)   Essential (primary) hypertension   Atrial fibrillation (HCC)   Aortic atherosclerosis (HCC)   Atherosclerotic heart disease of native coronary artery with unspecified angina pectoris (HCC)   Implantable cardioverter-defibrillator (ICD) in situ   Acute pulmonary edema (HCC)   DOE (dyspnea on exertion)   Chronic obstructive pulmonary disease, unspecified COPD type (HCC)   Assessment and Plan:  * Acute on chronic combined systolic and diastolic CHF (congestive heart failure) (HCC) Strict I's and O's Patient had complete echo on 02/17/2023: Severe LV and RV systolic dysfunction, estimated ejection fraction 20%,  mild AR, trivial PR, moderate TR Status post furosemide 80 mg IV one-time  dose per EDP One-time dose of furosemide 20 mg IV ordered on admission Ordered furosemide 60 mg IV twice daily for 04/24/2023 Admit to telemetry cardiac, inpatient  Abdominal ascites Presumed secondary to acute on chronic combined heart failure exacerbation in setting of hepatic steatosis Ultrasound-guided paracentesis has been ordered As needed albumin one-time dose has been ordered  Chronic obstructive pulmonary disease, unspecified COPD type (HCC) Does not appear to be in acute EXTR admission at this time Home DuoNebs every 4 hours as needed for wheezing and shortness of breath resumed  DOE (dyspnea on exertion) Presumed secondary to pulmonary edema in setting of combined acute on chronic heart failure exacerbation  Acute pulmonary edema (HCC) Presumed secondary to heart failure exacerbation, strict I's and O's, status post furosemide 80 mg IV one-time dose per EDP  Atrial fibrillation (HCC) Home Eliquis not resumed on admission due to recent fall with multiple skin lacerations, ecchymosis on the right arm and shoulder and ultrasound-guided paracentesis ordered on admission AM team to resume home Eliquis when the benefits outweigh the risk  Essential (primary) hypertension Home Coreg 6.25 mg p.o. twice daily with meals resumed, Imdur 50 mg daily resumed  Hypercholesteremia Home atorvastatin 40 mg daily resumed  Chart reviewed.   DVT prophylaxis: Heparin 5000 units subcutaneous every 8 hours starting on 04/24/2023, after ultrasound-guided paracentesis Code Status: Full code Diet: Heart healthy Family Communication: Updated wife and brother at bedside Disposition Plan: Pending clinical course Consults called: IR, ultrasound-guided paracentesis Admission status: Telemetry cardiac, inpatient  Past Medical History:  Diagnosis Date   AAA (abdominal aortic aneurysm) (HCC) 06/03/2007   [facility]; Dr. Hart Rochester   AICD (automatic cardioverter/defibrillator)  present    Arrhythmia    atrial fibrillation   Barrett's esophagus    Bladder cancer (HCC)    Bradycardia    CAD (coronary artery disease)    CAP (community acquired pneumonia) 11/13/2019   CHF (congestive heart failure) (HCC)    Cluster headache    COVID-19 09/27/2021   DDD (degenerative disc disease), lumbar    Diabetes mellitus without complication (HCC)    Dyspnea    WITH EXERTION   Edema    LEFT ANKLE   Fracture of skull base (HCC) 1997   due to fall   GERD (gastroesophageal reflux disease)    Gout    History of bladder cancer 12/1995   Hyperlipidemia    Hypertension    Hypocalcemia 04/19/2020   Possibly secondary to diuretics.   Low=5.9 04/19/2020   Malignant melanoma (HCC) 12/2012   right dorsal forearm excised   Myocardial infarction [facility])    LAST 2014   Osteoarthritis of knee    Other specified complication of vascular prosthetic devices, implants and grafts, initial encounter (HCC) 08/22/2021   Pacemaker 10/10/2006   Pancreatitis 11/22/2019   Pneumonia    2016   Pre-diabetes    Psoriasis    Rib fracture 1997   due to fall   Sleep apnea    CPAP   Stroke [facility])    Venous incompetence    Past Surgical History:  Procedure Laterality Date   ABDOMINAL AORTIC ANEURYSM REPAIR  06/03/2007   [facility]; Dr. Hart Rochester   ANGIOPLASTY  1994   MI   BLADDER TUMOR EXCISION  12/1995   CAROTID PTA/STENT INTERVENTION Left 07/23/2022   Procedure: CAROTID PTA/STENT INTERVENTION;  Surgeon: Renford Dills, MD;  Location: [facility] INVASIVE  CV LAB;  Service: Cardiovascular;  Laterality: Left;   CATARACT EXTRACTION W/PHACO Left 10/22/2016   Procedure: CATARACT EXTRACTION PHACO AND INTRAOCULAR LENS PLACEMENT (IOC);  Surgeon: Galen Manila, MD;  Location: [facility] ORS;  Service: Ophthalmology;  Laterality: Left;  [place] 47.7 AP% 18.4 CDE 8.78 Fluid pack lot # [id number]   CATARACT EXTRACTION W/PHACO Right 12/10/2016   Procedure: CATARACT EXTRACTION PHACO AND INTRAOCULAR  LENS PLACEMENT (IOC);  Surgeon: Galen Manila, MD;  Location: [facility] ORS;  Service: Ophthalmology;  Laterality: Right;  [place] 00:39 AP% 23.3 CDE 9.13 Fluid pack lot # [id number]   CORONARY ANGIOPLASTY     STENTS X 5   CORONARY ARTERY BYPASS GRAFT  09/22/2006   four   ELBOW BURSA SURGERY     DUE TO GOUT   INSERT / REPLACE / REMOVE PACEMAKER     MELANOMA EXCISION  12/2012   Right forearm   PPM GENERATOR CHANGEOUT N/A 02/13/2023   Procedure: PPM GENERATOR CHANGEOUT;  Surgeon: Marcina Millard, MD;  Location: [facility] INVASIVE CV LAB;  Service: Cardiovascular;  Laterality: N/A;   Social History:  reports that he quit smoking about 23 years ago. His smoking use included cigarettes. He has a 30.00 pack-year smoking history. He has never used smokeless tobacco. He reports that he does not drink alcohol and does not use drugs.  Allergies  Allergen Reactions   Amlodipine Besylate Swelling    Had a reaction when taking with colcrys    Crestor [Rosuvastatin]     Muscle cramps and pain   Rocephin [Ceftriaxone]     unknown   Family History  Problem Relation Age of Onset   Cancer Mother        Melanoma skin cancer   Heart attack Father 49   Cancer Father        throat cancer   Arthritis Brother    Family history: Family history reviewed and not pertinent.  Prior to Admission medications   Medication Sig Start Date End Date Taking? Authorizing Provider  albuterol (VENTOLIN HFA) 108 (90 Base) MCG/ACT inhaler Inhale 2 puffs into the lungs every 6 (six) hours as needed for wheezing or shortness of breath. 01/03/23   Malva Limes, MD  allopurinol (ZYLOPRIM) 300 MG tablet Take 150 mg by mouth daily.    [provider]  apixaban (ELIQUIS) 5 MG TABS tablet Take 2.5 mg by mouth 2 (two) times daily.    [provider]  aspirin 81 MG EC tablet Take 81 mg by mouth daily.     [provider]  atorvastatin (LIPITOR) 40 MG tablet TAKE 1 TABLET(40 MG) BY MOUTH DAILY Patient  taking differently: Take 40 mg by mouth daily. 12/30/22   Malva Limes, MD  carvedilol (COREG) 6.25 MG tablet Take 1 tablet (6.25 mg total) by mouth 2 (two) times daily with a meal. 02/27/23   Sunnie Nielsen, DO  empagliflozin (JARDIANCE) 10 MG TABS tablet Take 10 mg by mouth daily.    [provider]  ipratropium-albuterol (DUONEB) 0.5-2.5 (3) MG/3ML SOLN Inhale 3 mLs into the lungs every 4 (four) hours as needed (wheezing, shob). 01/27/23 04/27/23  [provider]  isosorbide mononitrate (IMDUR) 30 MG 24 hr tablet TAKE 1/2 TABLET(15 MG) BY MOUTH DAILY Patient taking differently: Take 15 mg by mouth daily. 11/28/22   Malva Limes, MD  Magnesium 200 MG TABS Take 1 tablet (200 mg total) by mouth in the morning and at bedtime. 11/11/22   Delma Freeze, FNP  pantoprazole (  PROTONIX) 40 MG tablet TAKE 1 TABLET BY MOUTH EVERY DAY 11/21/22   Malva Limes, MD  potassium chloride SA (KLOR-CON M) 20 MEQ tablet Take 20 mEq by mouth daily.    [provider]  torsemide (DEMADEX) 20 MG tablet 60 mg (3 tablets) every morning and 40 mg (2 tablets) every afternoon/evening Patient taking differently: Take 40 mg by mouth 2 (two) times daily. 02/27/23   Sunnie Nielsen, DO  triamcinolone ointment (KENALOG) 0.1 % APPLY TOPICALLY TWICE DAILY AS DIRECTED 07/15/22   Malva Limes, MD   Physical Exam: Vitals:   04/23/23 1233 04/23/23 1234  BP: 116/83   Pulse: 62   Resp: 18   Temp: 98 F (36.7 C)   SpO2: 96%   Weight:  88.5 kg  Height:  5\' 10"  (1.778 m)   Constitutional: appears frail, chronically ill, calm Eyes: PERRL, lids, mild bilateral scleral icterus ENMT: Mucous membranes are moist. Posterior pharynx clear of any exudate or lesions. Age-appropriate dentition. Hearing appropriate Neck: normal, supple, no masses, no thyromegaly Respiratory: General decrease lung sounds bilaterally, no wheezing, no crackles. Normal respiratory effort. No accessory muscle use.   Cardiovascular: Regular rate and rhythm, no murmurs / rubs / gallops. No extremity edema. 2+ pedal pulses. No carotid bruits.  Abdomen: Distended abdomen, no tenderness, no masses palpated, no hepatosplenomegaly. Bowel sounds positive.  Musculoskeletal: no clubbing / cyanosis. No joint deformity upper and lower extremities. Good ROM, no contractures, no atrophy. Normal muscle tone.  Skin: Tanned skin, no ulcers. No induration.  Multiple laceration and skin tears on the right upper extremity and right shoulder area Neurologic: Sensation intact. Strength 5/5 in all 4.  Psychiatric: Normal judgment and insight. Alert and oriented x 3. Normal mood.   EKG: independently reviewed, showing atrial fibrillation with rate of 64, QTc 543  Chest x-ray on Admission: I personally reviewed and I agree with radiologist reading as below.  DG Shoulder Right  Result Date: 04/23/2023 CLINICAL DATA:  Trauma, fall, pain EXAM: RIGHT SHOULDER - 2+ VIEW COMPARISON:  None Available. FINDINGS: No recent fracture or dislocation is seen. Deformity in the shaft of right clavicle has not changed in comparison with the study done on 04/03/2023 suggesting old healed fracture. IMPRESSION: No recent fracture or dislocation is seen in right shoulder. Electronically Signed   By: Ernie Avena M.D.   On: 04/23/2023 14:12   DG Chest Port 1 View  Result Date: 04/23/2023 CLINICAL DATA:  Trauma, fall EXAM: PORTABLE CHEST 1 VIEW COMPARISON:  04/03/2023 FINDINGS: Heart is enlarged in size. There is previous coronary bypass surgery. Pacemaker/defibrillator battery is seen in left infraclavicular region. Central pulmonary vessels are prominent. Increased interstitial markings are seen in the parahilar regions and lower lung fields. There is blunting of right lateral CP angle. There is no pneumothorax. IMPRESSION: Cardiomegaly. Increased interstitial markings are seen in the parahilar regions and lower lung fields suggesting possible  mild interstitial pulmonary edema. There is no focal pulmonary consolidation. Small right pleural effusion. Electronically Signed   By: Ernie Avena M.D.   On: 04/23/2023 14:10   CT Head Wo Contrast  Result Date: 04/23/2023 CLINICAL DATA:  Head trauma, moderate-severe; Neck trauma (Age >= 65y). EXAM: CT HEAD WITHOUT CONTRAST CT CERVICAL SPINE WITHOUT CONTRAST TECHNIQUE: Multidetector CT imaging of the head and cervical spine was performed following the standard protocol without intravenous contrast. Multiplanar CT image reconstructions of the cervical spine were also generated. RADIATION DOSE REDUCTION: This exam was performed according to the departmental  dose-optimization program which includes automated exposure control, adjustment of the mA and/or kV according to patient size and/or use of iterative reconstruction technique. COMPARISON:  Head CT 10/27/2022. FINDINGS: CT HEAD FINDINGS Brain: No acute hemorrhage. Unchanged old infarct in the medial aspect of the left occipital lobe. No new loss of gray-white differentiation. No hydrocephalus or extra-axial collection. No mass effect or midline shift. Vascular: No hyperdense vessel or unexpected calcification. Skull: No acute fracture. Old fracture deformity along left parietal and squamous temporal bones. Sinuses/Orbits: Chronic bilateral maxillary and sphenoid sinusitis. Orbits are unremarkable. Other: None. CT CERVICAL SPINE FINDINGS Alignment: Normal. Skull base and vertebrae: No acute fracture. Normal craniocervical junction. No suspicious bone lesions. Soft tissues and spinal canal: No prevertebral fluid or swelling. No visible canal hematoma. Disc levels: Multilevel cervical spondylosis, worst at C6-7, where there is at least moderate spinal canal stenosis. Upper chest: Large right pleural effusion. Other: Left carotid stent in place. IMPRESSION: 1. No acute intracranial abnormality. Old left PCA territory infarct. 2. No acute cervical spine  fracture or traumatic malalignment. 3. Large right pleural effusion. Electronically Signed   By: Orvan Falconer M.D.   On: 04/23/2023 13:56   CT Cervical Spine Wo Contrast  Result Date: 04/23/2023 CLINICAL DATA:  Head trauma, moderate-severe; Neck trauma (Age >= 65y). EXAM: CT HEAD WITHOUT CONTRAST CT CERVICAL SPINE WITHOUT CONTRAST TECHNIQUE: Multidetector CT imaging of the head and cervical spine was performed following the standard protocol without intravenous contrast. Multiplanar CT image reconstructions of the cervical spine were also generated. RADIATION DOSE REDUCTION: This exam was performed according to the departmental dose-optimization program which includes automated exposure control, adjustment of the mA and/or kV according to patient size and/or use of iterative reconstruction technique. COMPARISON:  Head CT 10/27/2022. FINDINGS: CT HEAD FINDINGS Brain: No acute hemorrhage. Unchanged old infarct in the medial aspect of the left occipital lobe. No new loss of gray-white differentiation. No hydrocephalus or extra-axial collection. No mass effect or midline shift. Vascular: No hyperdense vessel or unexpected calcification. Skull: No acute fracture. Old fracture deformity along left parietal and squamous temporal bones. Sinuses/Orbits: Chronic bilateral maxillary and sphenoid sinusitis. Orbits are unremarkable. Other: None. CT CERVICAL SPINE FINDINGS Alignment: Normal. Skull base and vertebrae: No acute fracture. Normal craniocervical junction. No suspicious bone lesions. Soft tissues and spinal canal: No prevertebral fluid or swelling. No visible canal hematoma. Disc levels: Multilevel cervical spondylosis, worst at C6-7, where there is at least moderate spinal canal stenosis. Upper chest: Large right pleural effusion. Other: Left carotid stent in place. IMPRESSION: 1. No acute intracranial abnormality. Old left PCA territory infarct. 2. No acute cervical spine fracture or traumatic malalignment. 3.  Large right pleural effusion. Electronically Signed   By: Orvan Falconer M.D.   On: 04/23/2023 13:56    Labs on Admission: I have personally reviewed following labs  CBC: Recent Labs  Lab 04/23/23 1358  WBC 4.3  NEUTROABS 2.6  HGB 11.8*  HCT 39.9  MCV 100.5*  PLT 110*   Basic Metabolic Panel: Recent Labs  Lab 04/23/23 1358  NA 140  K 3.6  CL 101  CO2 29  GLUCOSE 91  BUN 62*  CREATININE 3.17*  CALCIUM 8.6*   GFR: Estimated Creatinine Clearance: 20.8 mL/min (A) (by C-G formula based on SCr of 3.17 mg/dL (H)).  Liver Function Tests: Recent Labs  Lab 04/23/23 1358  AST 31  ALT 10  ALKPHOS 140*  BILITOT 2.3*  PROT 6.8  ALBUMIN 2.6*   Urine analysis:  Component Value Date/Time   COLORURINE YELLOW (A) 02/25/2023 0933   APPEARANCEUR CLEAR (A) 02/25/2023 0933   LABSPEC 1.006 02/25/2023 0933   PHURINE 5.0 02/25/2023 0933   GLUCOSEU 50 (A) 02/25/2023 0933   HGBUR NEGATIVE 02/25/2023 0933   BILIRUBINUR NEGATIVE 02/25/2023 0933   BILIRUBINUR negative 03/31/2018 1452   KETONESUR NEGATIVE 02/25/2023 0933   PROTEINUR NEGATIVE 02/25/2023 0933   UROBILINOGEN 0.2 03/31/2018 1452   NITRITE NEGATIVE 02/25/2023 0933   LEUKOCYTESUR NEGATIVE 02/25/2023 0933   This document was prepared using Dragon Voice Recognition software and may include unintentional dictation errors.  Dr. Sedalia Muta [facility]  If 7PM-7AM, please contact overnight-coverage provider If 7AM-7PM, please contact day attending provider [email or web address]  04/23/2023, 4:34 PM

## 2023-04-23 NOTE — ED Provider Notes (Signed)
[facility] Provider Note    Event Date/Time   First MD Initiated Contact with Patient 04/23/23 1242     (approximate)   History   Fall   HPI  Taylor [name] is a 82 y.o. male   Past medical history of CHF, A-fib on Eliquis, AICD in place, type II diabetic, hypertension, AAA, ascites from decompensated liver cirrhosis, COPD who presents with a syncopal episode at a hardware store earlier today was standing making telephone call when he fell,.  He remembers everything that happened denies any chest pain palpitation shortness of breath focal weakness and does not really have a good reason for why he fell.  Of note he has been having increased fluid retention in his legs and abdomen and exertional shortness of breath over the last several weeks and was admitted for the same earlier this month.  Denies any chest pain, fever, cough.  He does have pain to the right side of his forehead and his right shoulder from his fall.  Independent Historian contributed to assessment above: His wife is at bedside corroborates information given above  External Medical Documents Reviewed: Discharge summary from 04/06/2023 when he was admitted for acute hypoxemic respiratory failure diagnosed with acute on chronic heart failure with reduced ejection fraction and decompensated liver cirrhosis with ascites underwent paracentesis with 6 L taken off      Physical Exam   Triage Vital Signs: ED Triage Vitals  Enc Vitals Group     BP 04/23/23 1233 116/83     Pulse Rate 04/23/23 1233 62     Resp 04/23/23 1233 18     Temp 04/23/23 1233 98 F (36.7 C)     Temp src --      SpO2 04/23/23 1233 96 %     Weight 04/23/23 1234 195 lb (88.5 kg)     Height 04/23/23 1234 5\' 10"  (1.778 m)     Head Circumference --      Peak Flow --      Pain Score 04/23/23 1234 7     Pain Loc --      Pain Edu? --      Excl. in GC? --     Most recent vital signs: Vitals:   04/23/23 1233  BP: 116/83   Pulse: 62  Resp: 18  Temp: 98 F (36.7 C)  SpO2: 96%    General: Awake, no distress.  CV:  Good peripheral perfusion.  Resp:  Normal effort.  Abd:  Distended no tender  Other:  Appears fluid overloaded with rales on auscultation, distended abdomen and bilateral lower extremity edema, bruising behind the right shoulder, small hematoma on the right side of the forehead   ED Results / Procedures / Treatments   Labs (all labs ordered are listed, but only abnormal results are displayed) Labs Reviewed  CBC WITH DIFFERENTIAL/PLATELET - Abnormal; Notable for the following components:      Result Value   RBC 3.97 (*)    Hemoglobin 11.8 (*)    MCV 100.5 (*)    MCHC 29.6 (*)    RDW 19.2 (*)    Platelets 110 (*)    All other components within normal limits  BRAIN NATRIURETIC PEPTIDE - Abnormal; Notable for the following components:   B Natriuretic Peptide 1,516.7 (*)    All other components within normal limits  COMPREHENSIVE METABOLIC PANEL - Abnormal; Notable for the following components:   BUN 62 (*)    Creatinine, Ser 3.17 (*)  Calcium 8.6 (*)    Albumin 2.6 (*)    Alkaline Phosphatase 140 (*)    Total Bilirubin 2.3 (*)    GFR, Estimated 19 (*)    All other components within normal limits  BLOOD GAS, VENOUS - Abnormal; Notable for the following components:   pCO2, Ven 61 (*)    pO2, Ven <31 (*)    Bicarbonate 32.9 (*)    Acid-Base Excess 5.2 (*)    All other components within normal limits  TROPONIN I (HIGH SENSITIVITY) - Abnormal; Notable for the following components:   Troponin I (High Sensitivity) 20 (*)    All other components within normal limits  PROCALCITONIN  TROPONIN I (HIGH SENSITIVITY)     I ordered and reviewed the above labs they are notable for proBNP is elevated  EKG  ED ECG REPORT I, Pilar Jarvis, the attending physician, personally viewed and interpreted this ECG.   Date: 04/23/2023  EKG Time: 1354  Rate: 64  Rhythm: AF  Axis: nl   Intervals:none  ST&T Change: no stemi    RADIOLOGY I independently reviewed and interpreted CT head show no bleed   PROCEDURES:  Critical Care performed: No  Procedures   MEDICATIONS ORDERED IN ED: Medications  acetaminophen (TYLENOL) tablet 650 mg (has no administration in time range)    Or  acetaminophen (TYLENOL) suppository 650 mg (has no administration in time range)  ondansetron (ZOFRAN) tablet 4 mg (has no administration in time range)    Or  ondansetron (ZOFRAN) injection 4 mg (has no administration in time range)  senna-docusate (Senokot-S) tablet 1 tablet (has no administration in time range)  furosemide (LASIX) injection 60 mg (has no administration in time range)  albumin human 25 % solution 12.5 g (has no administration in time range)  furosemide (LASIX) injection 20 mg (has no administration in time range)  allopurinol (ZYLOPRIM) tablet 150 mg (has no administration in time range)  atorvastatin (LIPITOR) tablet 40 mg (has no administration in time range)  aspirin EC tablet 81 mg (has no administration in time range)  carvedilol (COREG) tablet 6.25 mg (has no administration in time range)  isosorbide mononitrate (IMDUR) 24 hr tablet 15 mg (has no administration in time range)  pantoprazole (PROTONIX) EC tablet 40 mg (has no administration in time range)  ipratropium-albuterol (DUONEB) 0.5-2.5 (3) MG/3ML nebulizer solution 3 mL (has no administration in time range)  heparin injection 5,000 Units (has no administration in time range)  furosemide (LASIX) injection 80 mg (80 mg Intravenous Given 04/23/23 1408)    External physician / consultants:  I spoke with hospitalist regarding care plan for this patient.   IMPRESSION / MDM / ASSESSMENT AND PLAN / ED COURSE  I reviewed the triage vital signs and the nursing notes.                                Patient's presentation is most consistent with acute presentation with potential threat to life or bodily  function.  Differential diagnosis includes, but is not limited to, syncope, cardiogenic syncope, dysrhythmia, ACS, PE, CVA, fluid overload/anasarca/decompensated cirrhosis, CHF exacerbation   The patient is on the cardiac monitor to evaluate for evidence of arrhythmia and/or significant heart rate changes.  MDM: Uncertain what is causing his syncopal episode but does not appear to be infection related or cardiogenic but he does have symptoms of fluid overload cirrhosis ascites and exertional dyspnea with peripheral edema/anasarca that  may be driving his syncopal episode.  Traumatic injuries with head bruising and shoulder bruising fortunately imaging shows no fractures or internal bleeding.  Doubt SBP w no fever and nontender abd. Admission for exertional dyspnea/anasarca.       FINAL CLINICAL IMPRESSION(S) / ED DIAGNOSES   Final diagnoses:  Anasarca  Abdominal distension  Acute on chronic congestive heart failure, unspecified heart failure type (HCC)  Fall, initial encounter  Traumatic injury of head, initial encounter     Rx / DC Orders   ED Discharge Orders     None        Note:  This document was prepared using Dragon voice recognition software and may include unintentional dictation errors.    Pilar Jarvis, MD 04/23/23 [phone]

## 2023-04-23 NOTE — ED Triage Notes (Signed)
Pt was at [facility] and fell from standing position hit head on the cement floor. Pt denies LOC. Pt is on blood thinners. Abrasions to right arm dressed by EMS. Complains right sided shoulder pain.

## 2023-04-23 NOTE — Assessment & Plan Note (Addendum)
Worsening cough for the past couple of week, Calcitonin added 0.25 Started him on levofloxacin Home DuoNebs every 4 hours as needed for wheezing and shortness of breath resumed

## 2023-04-23 NOTE — ED Triage Notes (Signed)
First nurse note: Pt to ED [facility] from [place], fell while trying to answer phone call. Hit head. Denies loc, + blood thinners. Hematoma to right forehead. Abrasions to right forearm.

## 2023-04-23 NOTE — Assessment & Plan Note (Addendum)
Presumed secondary to acute on chronic combined heart failure exacerbation in setting of hepatic steatosis Ultrasound-guided paracentesis has been ordered As needed albumin one-time dose has been ordered

## 2023-04-23 NOTE — Assessment & Plan Note (Signed)
Presumed secondary to heart failure exacerbation, strict I's and O's, status post furosemide 80 mg IV one-time dose per EDP

## 2023-04-23 NOTE — Hospital Course (Addendum)
[name] is a 82 year old male with combined heart failure, NYHA class III, SA node dysfunction, hyperlipidemia, CKD stage IV, OSA,, hepatic steatosis, history of abdominal ascites status post paracentesis, hypertension, atrial fibrillation, history of abdominal aortic aneurysm status post endovascular repair in 2008, CAD status post CABG and PCI, old left PCA territory, who presents to the emergency department for chief concerns of weight gain, shortness of breath, and falling at [facility] hardware store.  Vitals in the ED showed temperature 98, respiration rate of 18, heart rate of 62, blood pressure 168/83, SpO2 of 96% on room air.  Serum sodium is 140, potassium 3.6, chloride 101, bicarb 29, BUN of 62, serum creatinine of 3.17, EGFR of 19, nonfasting blood glucose 91, WBC 4.3, hemoglobin 11.8, platelets of 110.  Alk phos was elevated at 140.  Albumin level was 2.6.  BNP was elevated 1516.7, high sensitive troponin was 20.  ED treatment: Furosemide 80 mg IV one-time dose  5/30: Vital stable.  Slight worsening of creatinine to 3.33 which remained around his baseline.  S/p paracentesis with removal of 6.8 L of amber-colored fluid. Worsening cough, procalcitonin at 0.25-started p.o. levofloxacin. Prior echocardiogram with EF of less than 20%, his cardiologist was also consulted.  Established patient with [facility] clinic. 5/31: Hemodynamically stable and clinically improving.  Cardiology would like to continue with IV diuresis.  Renal function stable. 6/1: Vital stable.  Slight worsening of creatinine and BUN today.  Holding today's Lasix. Started wheezing-added DuoNeb. 6/2: Hemodynamically stable.  Creatinine with a small improvement to 3.09, nephrology is planning to restart Lasix at 80 mg twice daily along with spironolactone.  Appreciate nephrology help with diuresis. 6/3: Good UOP with current regimen.  Improving renal function.  Wants to go home, ordered PT, will continue with IV diuresis  today and switching to torsemide 60 mg twice daily from tomorrow and discharge.  Patient wants to continue with cardiac rehab and needs reorders on discharge, message sent to cardiology. 6/4: Patient remained stable with improving renal function and good UOP.  He received 80 mg of IV Lasix in the morning and is being discharged on torsemide 60 mg twice daily.  Was also given 2 more doses of Levaquin to complete the course.  Patient was advised to stop taking Jardiance and decrease the dose of Eliquis. Patient will resume his cardiac rehab on discharge. Home health services were ordered.  Patient will continue on current medication and need to have a close follow-up with his providers for further recommendations.

## 2023-04-23 NOTE — Assessment & Plan Note (Signed)
Home atorvastatin 40 mg daily resumed

## 2023-04-23 NOTE — Progress Notes (Signed)
Daily Session Note  Patient Details  Name: Taylor [name] MRN: [id number] Date of Birth: 09-19-1941 Referring Provider:   Flowsheet Row Cardiac Rehab from 04/10/2023 in [facility] Cardiac [facility] Pulmonary [facility]  Referring Provider Dr. Marcina Millard, MD       Encounter Date: 04/23/2023  Check In:  Session Check In - 04/23/23 1031       Check-In   Supervising physician immediately available to respond to emergencies See telemetry face sheet for immediately available ER MD    Location [facility]-Cardiac & Pulmonary Rehab    Staff Present Lanny Hurst, RN, ADN;Joseph Reino Kent, RCP,RRT,BSRT;Noah Tickle, BS, Exercise Physiologist;Meredith Jewel Baize, RN BSN    Virtual Visit No    Medication changes reported     No    Fall or balance concerns reported    No    Warm-up and Cool-down Performed on first and last piece of equipment    Resistance Training Performed Yes    VAD Patient? No    PAD/SET Patient? No      Pain Assessment   Currently in Pain? No/denies                Social History   Tobacco Use  Smoking Status Former   Packs/day: 1.00   Years: 30.00   Additional pack years: 0.00   Total pack years: 30.00   Types: Cigarettes   Quit date: 11/26/1999   Years since quitting: 23.4  Smokeless Tobacco Never    Goals Met:  Independence with exercise equipment Exercise tolerated well No report of concerns or symptoms today Strength training completed today  Goals Unmet:  Not Applicable  Comments: Pt able to follow exercise prescription today without complaint.  Will continue to monitor for progression. Pt is up 8 lbs in weight today. Pt states he is already talking to his doctor about weight increase.    Dr. Bethann Punches is Medical Director for [facility] Cardiac Rehabilitation.  Dr. Vida Rigger is Medical Director for [facility] Pulmonary Rehabilitation.

## 2023-04-23 NOTE — Assessment & Plan Note (Signed)
Presumed secondary to pulmonary edema in setting of combined acute on chronic heart failure exacerbation

## 2023-04-23 NOTE — Assessment & Plan Note (Signed)
Home Coreg 6.25 mg p.o. twice daily with meals resumed, Imdur 50 mg daily resumed

## 2023-04-23 NOTE — ED Provider Triage Note (Signed)
Emergency Medicine Provider Triage Evaluation Note  Taylor [name] , a 82 y.o. male  was evaluated in triage.  Pt complains of fall that occurred today at [employer]. Pt reports he was just standing when he fell. Pt endorses hitting head. Denies LOC, confusion, weakness. Pt is on blood thinners.   Physical Exam  BP 116/83 (BP Location: Left Arm)   Pulse 62   Temp 98 F (36.7 C)   Resp 18   Ht 5\' 10"  (1.778 m)   Wt 88.5 kg   SpO2 96%   BMI 27.98 kg/m  Gen:   Awake, no distress   Resp:  Normal effort  MSK:   Moves extremities without difficulty  Other:  Right UE is bleeding, wrapped in guaze. Multiple abrasions to forehead more prominent on right side. Pain with neck extension.   Medical Decision Making  Medically screening exam initiated at 12:39 PM.  Appropriate orders placed.  KSEAN MCGLAUN was informed that the remainder of the evaluation will be completed by another provider, this initial triage assessment does not replace that evaluation, and the importance of remaining in the ED until their evaluation is complete.     Romeo Apple, Janeah Kovacich A, PA-C 04/23/23 1242

## 2023-04-24 ENCOUNTER — Inpatient Hospital Stay: Payer: Medicare Other

## 2023-04-24 ENCOUNTER — Encounter: Payer: Self-pay | Admitting: Internal Medicine

## 2023-04-24 DIAGNOSIS — N184 Chronic kidney disease, stage 4 (severe): Secondary | ICD-10-CM

## 2023-04-24 DIAGNOSIS — R188 Other ascites: Secondary | ICD-10-CM | POA: Diagnosis not present

## 2023-04-24 DIAGNOSIS — R0609 Other forms of dyspnea: Secondary | ICD-10-CM

## 2023-04-24 DIAGNOSIS — I480 Paroxysmal atrial fibrillation: Secondary | ICD-10-CM

## 2023-04-24 DIAGNOSIS — J810 Acute pulmonary edema: Secondary | ICD-10-CM | POA: Diagnosis not present

## 2023-04-24 DIAGNOSIS — I10 Essential (primary) hypertension: Secondary | ICD-10-CM

## 2023-04-24 DIAGNOSIS — G4733 Obstructive sleep apnea (adult) (pediatric): Secondary | ICD-10-CM

## 2023-04-24 DIAGNOSIS — E7800 Pure hypercholesterolemia, unspecified: Secondary | ICD-10-CM

## 2023-04-24 DIAGNOSIS — J449 Chronic obstructive pulmonary disease, unspecified: Secondary | ICD-10-CM

## 2023-04-24 DIAGNOSIS — I5043 Acute on chronic combined systolic (congestive) and diastolic (congestive) heart failure: Secondary | ICD-10-CM | POA: Diagnosis not present

## 2023-04-24 LAB — CBC
HCT: 40.4 % (ref 39.0–52.0)
Hemoglobin: 12.3 g/dL — ABNORMAL LOW (ref 13.0–17.0)
MCH: 30.1 pg (ref 26.0–34.0)
MCHC: 30.4 g/dL (ref 30.0–36.0)
MCV: 99 fL (ref 80.0–100.0)
Platelets: 107 10*3/uL — ABNORMAL LOW (ref 150–400)
RBC: 4.08 MIL/uL — ABNORMAL LOW (ref 4.22–5.81)
RDW: 19.3 % — ABNORMAL HIGH (ref 11.5–15.5)
WBC: 3.9 10*3/uL — ABNORMAL LOW (ref 4.0–10.5)
nRBC: 0 % (ref 0.0–0.2)

## 2023-04-24 LAB — BASIC METABOLIC PANEL
Anion gap: 9 (ref 5–15)
BUN: 61 mg/dL — ABNORMAL HIGH (ref 8–23)
CO2: 30 mmol/L (ref 22–32)
Calcium: 8.8 mg/dL — ABNORMAL LOW (ref 8.9–10.3)
Chloride: 100 mmol/L (ref 98–111)
Creatinine, Ser: 3.33 mg/dL — ABNORMAL HIGH (ref 0.61–1.24)
GFR, Estimated: 18 mL/min — ABNORMAL LOW (ref >60)
Glucose, Bld: 104 mg/dL — ABNORMAL HIGH (ref 70–99)
Potassium: 3.7 mmol/L (ref 3.5–5.1)
Sodium: 139 mmol/L (ref 135–145)

## 2023-04-24 MED ORDER — APIXABAN 2.5 MG PO TABS
2.5000 mg | ORAL_TABLET | Freq: Two times a day (BID) | ORAL | Status: DC
  Administered 2023-04-24 – 2023-04-29 (×11): 2.5 mg via ORAL
  Filled 2023-04-24 (×12): qty 1

## 2023-04-24 MED ORDER — LIDOCAINE HCL (PF) 1 % IJ SOLN
10.0000 mL | Freq: Once | INTRAMUSCULAR | Status: AC
  Administered 2023-04-24: 10 mL via INTRADERMAL

## 2023-04-24 MED ORDER — LEVOFLOXACIN 500 MG PO TABS
500.0000 mg | ORAL_TABLET | ORAL | Status: DC
  Administered 2023-04-26 – 2023-04-28 (×2): 500 mg via ORAL
  Filled 2023-04-24 (×2): qty 1

## 2023-04-24 MED ORDER — LEVOFLOXACIN 500 MG PO TABS
750.0000 mg | ORAL_TABLET | Freq: Once | ORAL | Status: AC
  Administered 2023-04-24: 750 mg via ORAL
  Filled 2023-04-24: qty 2

## 2023-04-24 MED ORDER — POTASSIUM CHLORIDE CRYS ER 20 MEQ PO TBCR
20.0000 meq | EXTENDED_RELEASE_TABLET | Freq: Every day | ORAL | Status: DC
  Administered 2023-04-24 – 2023-04-29 (×6): 20 meq via ORAL
  Filled 2023-04-24 (×6): qty 1

## 2023-04-24 MED ORDER — HYDROCODONE-ACETAMINOPHEN 5-325 MG PO TABS
1.0000 | ORAL_TABLET | ORAL | Status: DC | PRN
  Administered 2023-04-25 – 2023-04-28 (×2): 1 via ORAL
  Filled 2023-04-24 (×2): qty 1

## 2023-04-24 MED ORDER — DM-GUAIFENESIN ER 30-600 MG PO TB12
1.0000 | ORAL_TABLET | Freq: Two times a day (BID) | ORAL | Status: DC
  Administered 2023-04-24 – 2023-04-28 (×10): 1 via ORAL
  Filled 2023-04-24 (×11): qty 1

## 2023-04-24 NOTE — Assessment & Plan Note (Signed)
-   CPAP at night °

## 2023-04-24 NOTE — Assessment & Plan Note (Signed)
Creatinine seems stable around baseline. -Monitor renal function as patient is being diuresed -Avoid nephrotoxins

## 2023-04-24 NOTE — Assessment & Plan Note (Signed)
No chest pain. -Continue with home aspirin, carvedilol and statin

## 2023-04-24 NOTE — Procedures (Signed)
PROCEDURE SUMMARY:  Successful US guided paracentesis from RLQ.  Yielded 6.8 Liters of amber colored fluid.  No immediate complications.  Pt tolerated well.   Specimen was not sent for labs.  EBL < 1mL  Berneta Levins PA-C 04/24/2023 10:29 AM

## 2023-04-24 NOTE — Progress Notes (Signed)
Progress Note   Patient: Taylor [name] WUJ:[id number] DOB: 08-Feb-1941 DOA: 04/23/2023     1 DOS: the patient was seen and examined on 04/24/2023   Brief hospital course: Mr. Yaser Paavola is a 82 year old male with combined heart failure, NYHA class III, SA node dysfunction, hyperlipidemia, CKD stage IV, OSA,, hepatic steatosis, history of abdominal ascites status post paracentesis, hypertension, atrial fibrillation, history of abdominal aortic aneurysm status post endovascular repair in 2008, CAD status post CABG and PCI, old left PCA territory, who presents to the emergency department for chief concerns of weight gain, shortness of breath, and falling at [facility] [employer].  Vitals in the ED showed temperature 98, respiration rate of 18, heart rate of 62, blood pressure 168/83, SpO2 of 96% on room air.  Serum sodium is 140, potassium 3.6, chloride 101, bicarb 29, BUN of 62, serum creatinine of 3.17, EGFR of 19, nonfasting blood glucose 91, WBC 4.3, hemoglobin 11.8, platelets of 110.  Alk phos was elevated at 140.  Albumin level was 2.6.  BNP was elevated 1516.7, high sensitive troponin was 20.  ED treatment: Furosemide 80 mg IV one-time dose  5/30: Vital stable.  Slight worsening of creatinine to 3.33 which remained around his baseline.  S/p paracentesis with removal of 6.8 L of amber-colored fluid. Worsening cough, procalcitonin at 0.25-started p.o. levofloxacin. Prior echocardiogram with EF of less than 20%, his cardiologist was also consulted.  Established patient with [facility] clinic.   Assessment and Plan: * Acute on chronic combined systolic and diastolic CHF (congestive heart failure) (HCC) Patient had complete echo on 02/17/2023: Severe LV and RV systolic dysfunction, estimated ejection fraction 20%, mild AR, trivial PR, moderate TR Status post furosemide 80 mg IV one-time dose per EDP Continue furosemide 60 mg IV twice daily  Strict I's and O's Daily weight and BMP Cardiology  consult-Dr. Juliann Pares was notified  Abdominal ascites Presumed secondary to acute on chronic combined heart failure exacerbation in setting of hepatic steatosis S/p ultrasound-guided paracentesis with removal of 6.5 L of -Continue with IV diuresis  Acute pulmonary edema (HCC) Presumed secondary to heart failure exacerbation,  -Continue with IV diuresis  Chronic kidney disease, stage 4 (severe) (HCC) Creatinine seems stable around baseline. -Monitor renal function as patient is being diuresed -Avoid nephrotoxins  Chronic obstructive pulmonary disease, unspecified COPD type (HCC) Worsening cough for the past couple of week, Calcitonin added 0.25 Started him on levofloxacin Home DuoNebs every 4 hours as needed for wheezing and shortness of breath resumed  Essential (primary) hypertension Home Coreg 6.25 mg p.o. twice daily with meals resumed, Imdur 50 mg daily resumed  Atrial fibrillation (HCC) Home Eliquis not resumed on admission due to recent fall with multiple skin lacerations, ecchymosis on the right arm and shoulder and ultrasound-guided paracentesis ordered on admission. -Restarting home Eliquis as hemoglobin seems stable   CAD S/P percutaneous coronary angioplasty No chest pain. -Continue with home aspirin, carvedilol and statin  Obstructive sleep apnea -CPAP at night  Hypercholesteremia Home atorvastatin 40 mg daily resumed  DOE (dyspnea on exertion) Presumed secondary to pulmonary edema in setting of combined acute on chronic heart failure exacerbation  Acute exacerbation of CHF (congestive heart failure) (HCC)-resolved as of 04/23/2023 Patient had complete echo on 02/17/2023 which was read as: Severe LV and RV systolic dysfunction, estimated ejection fraction is 20%.  Mild AR, trivial PR, moderate TR. Strict I's and O's Status post furosemide 80 mg IV one-time dose per EDP I have ordered furosemide 60 mg IV twice  daily for 5/30/104 Admit to telemetry cardiac,  inpatient    Subjective: Patient was having increased in cough with small amount of sputum.  Stating that if this is been going on for 2 weeks with gradual worsening.  No chest pain.  Continue to have shortness of breath.  Physical Exam: Vitals:   04/24/23 1003 04/24/23 1013 04/24/23 1030 04/24/23 1210  BP:  115/77 103/84 99/62  Pulse:  64 86 62  Resp:   15 20  Temp: 97.7 F (36.5 C)   97.8 F (36.6 C)  TempSrc: Oral     SpO2:  100% 94% 98%  Weight:      Height:       General.  Frail elderly man, in no acute distress. Pulmonary.  Few scattered crackles bilaterally, normal respiratory effort. CV.  Regular rate and rhythm, no JVD, rub or murmur. Abdomen.  Soft, nontender, nondistended, BS positive. CNS.  Alert and oriented .  No focal neurologic deficit. Extremities.  2+ LE edema, no cyanosis, pulses intact and symmetrical. Psychiatry.  Judgment and insight appears normal.   Data Reviewed: Prior data reviewed  Family Communication: Called wife with no response  Disposition: Status is: Inpatient Remains inpatient appropriate because: Severity of illness  Planned Discharge Destination: Home  DVT prophylaxis.  Eliquis Time spent: 50 minutes  This record has been created using Conservation officer, historic buildings. Errors have been sought and corrected,but may not always be located. Such creation errors do not reflect on the standard of care.   Author: Arnetha Courser, MD 04/24/2023 1:14 PM  For on call review [email or web address].

## 2023-04-24 NOTE — Discharge Instructions (Signed)

## 2023-04-24 NOTE — Consult Note (Signed)
Pharmacy Antibiotic Note  Taylor [name] is a 82 y.o. male admitted on 04/23/2023 with pneumonia.  Pharmacy has been consulted for Levofloxacin dosing (provider requested PO).  Plan: Give levofloxacin 750 mg po x 1, followed by 500 mg po every 48 hours Follow renal function for needed dose adjustments  Height: 5\' 10"  (177.8 cm) Weight: 88.5 kg (195 lb) IBW/kg (Calculated) : 73  Temp (24hrs), Avg:98 F (36.7 C), Min:97.7 F (36.5 C), Max:98.2 F (36.8 C)  Recent Labs  Lab 04/23/23 1358 04/24/23 0456  WBC 4.3 3.9*  CREATININE 3.17* 3.33*    Estimated Creatinine Clearance: 19.8 mL/min (A) (by C-G formula based on SCr of 3.33 mg/dL (H)).    Allergies  Allergen Reactions   Amlodipine Besylate Swelling    Had a reaction when taking with colcrys    Rocephin [Ceftriaxone]     unknown   Crestor [Rosuvastatin]     Muscle cramps and pain. Tolerates atorvastatin    Antimicrobials this admission: Levofloxacin 5/30 >>   Dose adjustments this admission: N/A  Microbiology results: N/A  Thank you for allowing pharmacy to be a part of this patient's care.  Barrie Folk, PharmD 04/24/2023 12:15 PM

## 2023-04-24 NOTE — Consult Note (Signed)
[facility] CLINIC CARDIOLOGY CONSULT NOTE       Patient ID: Taylor [name] MRN: [id number] DOB/AGE: 03-Oct-1941 82 y.o.  Admit date: 04/23/2023 Referring Physician Dr. Arnetha Courser  Primary Physician Dr. Sherrie Mustache Primary Cardiologist Dr. Darrold Junker Reason for Consultation AoCHF  HPI: Taylor [name]. [name] is an 82yoM with a PMH of PMH of chronic HFrEF (EF 20%, moderate MR 02/17/2023), CAD s/p CABG 08/2016, hx VF arrest s/p MI in 1994 s/p dual-chamber ICD (2014) and recent battery change out 02/13/2023, paroxysmal AF (Eliquis), CKD 4, AAA s/p EVAR (2008), COPD, DM2 who presented to [facility] ED 04/23/2023 after a fall. Cardiology is consulted for assistance with his HF.   The patient presented via EMS from [facility] hardware store after a fall. He was leaving the store and went to answer and phone call and lost his balance, falling on his right side and hitting his shoulder a head. He denied preceding dizziness, lightheadedness, or chest pain before falling. Denies losing consciousness. Also over the past week he feels like he has gained 10 pounds with increased distention of his abdomen and scrotal swelling. He has been compliant with his torsemide, taking 60mg  AM and 40mg  PM and dose was recently increased by his nephrologist to 60mg  PM. The patient eats "whatever his wife cooks" which includes salmon, shrimp, green beans, mac & cheese - denies adding additional salt but unsure of salt content of these foods. Admits to drinking "too much" throughout the day - multiple "large" glasses of iced tea, chewing on ice through the day, and gatorade. Sometimes on his third glass of tea he would feel his belly swell. Denies chest pain, shortness of breath at rest, HR or palpitations, or worsening peripheral edema. Admits to a cough and a dry throat, had difficulty swallowing a baby aspirin and choked on it earlier this AM. In the ED, head CT negative for acute abnormality, showed old L PCA territory infarct. R shoulder XR without acute  fracture or dislocation, deformity of R clavicle shaft unchanged from prior study.  He was given IV Lasix 80 mg x 1, and 20 mg x 1 yesterday.  Also started on p.o. Levaquin out of concern for pneumonia.  He went for paracentesis performed by interventional radiology this morning which yielded 6.8 L of fluid.  This is his third large-volume paracentesis the past 2 months.  At my time of evaluation this afternoon, the patient feels much better since his paracentesis earlier today, but still has a dry cough he denies current chest pain or shortness of breath.    Vitals are notable for a blood pressure 103/84, heart rate in the 80s by pulse ox.  Telemetry leads are not currently connected.  He is comfortable on 2 L of oxygen by nasal cannula.  Labs notable for potassium 3.7, BUN/creatinine 61/3.33 and GFR of 18, which is around baseline, slightly worse than yesterday with creatinine of 3.17 and GFR of 19.  BNP elevated at 1516.  High-sensitivity troponin borderline elevated and flat trending at 20, 19.  Slight leukopenia with WBCs 3.9, H&H around baseline at 12.3/40.4, with chronic thrombocytopenia with platelets 107 K.  Chest x-ray with cardiomegaly, increased interstitial markings suggesting pulmonary edema with a small right pleural effusion.  Review of systems complete and found to be negative unless listed above     Past Medical History:  Diagnosis Date   AAA (abdominal aortic aneurysm) (HCC) 06/03/2007   [facility]; Dr. Hart Rochester   AICD (automatic cardioverter/defibrillator) present  Arrhythmia    atrial fibrillation   Barrett's esophagus    Bladder cancer (HCC)    Bradycardia    CAD (coronary artery disease)    CAP (community acquired pneumonia) 11/13/2019   CHF (congestive heart failure) (HCC)    Cluster headache    COVID-19 09/27/2021   DDD (degenerative disc disease), lumbar    Diabetes mellitus without complication (HCC)    Dyspnea    WITH EXERTION   Edema     LEFT ANKLE   Fracture of skull base (HCC) 1997   due to fall   GERD (gastroesophageal reflux disease)    Gout    History of bladder cancer 12/1995   Hyperlipidemia    Hypertension    Hypocalcemia 04/19/2020   Possibly secondary to diuretics.   Low=5.9 04/19/2020   Malignant melanoma (HCC) 12/2012   right dorsal forearm excised   Myocardial infarction [facility])    LAST 2014   Osteoarthritis of knee    Other specified complication of vascular prosthetic devices, implants and grafts, initial encounter (HCC) 08/22/2021   Pacemaker 10/10/2006   Pancreatitis 11/22/2019   Pneumonia    2016   Pre-diabetes    Psoriasis    Rib fracture 1997   due to fall   Sleep apnea    CPAP   Stroke [facility])    Venous incompetence     Past Surgical History:  Procedure Laterality Date   ABDOMINAL AORTIC ANEURYSM REPAIR  06/03/2007   [facility]; Dr. Hart Rochester   ANGIOPLASTY  1994   MI   BLADDER TUMOR EXCISION  12/1995   CAROTID PTA/STENT INTERVENTION Left 07/23/2022   Procedure: CAROTID PTA/STENT INTERVENTION;  Surgeon: Renford Dills, MD;  Location: [facility] INVASIVE CV LAB;  Service: Cardiovascular;  Laterality: Left;   CATARACT EXTRACTION W/PHACO Left 10/22/2016   Procedure: CATARACT EXTRACTION PHACO AND INTRAOCULAR LENS PLACEMENT (IOC);  Surgeon: Galen Manila, MD;  Location: [facility] ORS;  Service: Ophthalmology;  Laterality: Left;  [place] 47.7 AP% 18.4 CDE 8.78 Fluid pack lot # [id number]   CATARACT EXTRACTION W/PHACO Right 12/10/2016   Procedure: CATARACT EXTRACTION PHACO AND INTRAOCULAR LENS PLACEMENT (IOC);  Surgeon: Galen Manila, MD;  Location: [facility] ORS;  Service: Ophthalmology;  Laterality: Right;  [place] 00:39 AP% 23.3 CDE 9.13 Fluid pack lot # [id number]   CORONARY ANGIOPLASTY     STENTS X 5   CORONARY ARTERY BYPASS GRAFT  09/22/2006   four   ELBOW BURSA SURGERY     DUE TO GOUT   INSERT / REPLACE / REMOVE PACEMAKER     MELANOMA EXCISION  12/2012   Right forearm   PPM GENERATOR  CHANGEOUT N/A 02/13/2023   Procedure: PPM GENERATOR CHANGEOUT;  Surgeon: Marcina Millard, MD;  Location: [facility] INVASIVE CV LAB;  Service: Cardiovascular;  Laterality: N/A;    (Not in a hospital admission)  Social History   Socioeconomic History   Marital status: Married    Spouse name: [name]   Number of children: 3   Years of education: Not on file   Highest education level: 12th grade  Occupational History   Occupation: retired    Comment: previously worked as a Chief Financial Officer  Tobacco Use   Smoking status: Former    Packs/day: 1.00    Years: 30.00    Additional pack years: 0.00    Total pack years: 30.00    Types: Cigarettes    Quit date: 11/26/1999    Years since quitting: 23.4   Smokeless tobacco: Never  Vaping  Use   Vaping Use: Never used  Substance and Sexual Activity   Alcohol use: No   Drug use: No   Sexual activity: Not Currently  Other Topics Concern   Not on file  Social History Narrative   Lives at home with his family.  Independent at baseline.   Social Determinants of Health   Financial Resource Strain: Low Risk  (05/07/2022)   Overall Financial Resource Strain (CARDIA)    Difficulty of Paying Living Expenses: Not hard at all  Food Insecurity: No Food Insecurity (04/24/2023)   Hunger Vital Sign    Worried About Running Out of Food in the Last Year: Never true    Ran Out of Food in the Last Year: Never true  Transportation Needs: No Transportation Needs (04/24/2023)   PRAPARE - Administrator, Civil Service (Medical): No    Lack of Transportation (Non-Medical): No  Physical Activity: Insufficiently Active (04/18/2021)   Exercise Vital Sign    Days of Exercise per Week: 1 day    Minutes of Exercise per Session: 120 min  Stress: No Stress Concern Present (05/07/2022)   [employer] of Occupational Health - Occupational Stress Questionnaire    Feeling of Stress : Not at all  Social Connections: Moderately Isolated (05/07/2022)   Social  Connection and Isolation Panel [NHANES]    Frequency of Communication with Friends and Family: Never    Frequency of Social Gatherings with Friends and Family: Once a week    Attends Religious Services: More than 4 times per year    Active Member of [employer] or Organizations: No    Attends Banker Meetings: Never    Marital Status: Married  Catering manager Violence: Not At Risk (04/24/2023)   Humiliation, Afraid, Rape, and Kick questionnaire    Fear of Current or Ex-Partner: No    Emotionally Abused: No    Physically Abused: No    Sexually Abused: No    Family History  Problem Relation Age of Onset   Cancer Mother        Melanoma skin cancer   Heart attack Father 54   Cancer Father        throat cancer   Arthritis Brother       Intake/Output Summary (Last 24 hours) at 04/24/2023 1306 Last data filed at 04/24/2023 0600 Gross per 24 hour  Intake --  Output 300 ml  Net -300 ml    Vitals:   04/24/23 1000 04/24/23 1003 04/24/23 1013 04/24/23 1030  BP: 115/77  115/77 103/84  Pulse: (!) 53  64 86  Resp: 16   15  Temp:  97.7 F (36.5 C)    TempSrc:  Oral    SpO2: 95%  100% 94%  Weight:      Height:        PHYSICAL EXAM General: acute on chronically ill appearing elderly male, well nourished, in no acute distress. HEENT:  Normocephalic and atraumatic. Neck:  No JVD.  Lungs: Normal respiratory effort on 2L. Dry cough. Decreased breath sounds L base with bibasilar crackles in bases  Heart: HRRR . Normal S1 and S2 without gallops or murmurs.  Abdomen: Non-distended appearing.  Msk: Normal strength and tone for age. Extremities: Warm and well perfused. No clubbing, cyanosis. 1+ L and trace R edema.  Neuro: Alert and oriented X 3. Psych:  Answers questions appropriately.   Labs: Basic Metabolic Panel: Recent Labs    04/23/23 1358 04/24/23 0456  NA 140 139  K 3.6 3.7  CL 101 100  CO2 29 30  GLUCOSE 91 104*  BUN 62* 61*  CREATININE 3.17* 3.33*  CALCIUM  8.6* 8.8*   Liver Function Tests: Recent Labs    04/23/23 1358  AST 31  ALT 10  ALKPHOS 140*  BILITOT 2.3*  PROT 6.8  ALBUMIN 2.6*   No results for input(s): "LIPASE", "AMYLASE" in the last 72 hours. CBC: Recent Labs    04/23/23 1358 04/24/23 0456  WBC 4.3 3.9*  NEUTROABS 2.6  --   HGB 11.8* 12.3*  HCT 39.9 40.4  MCV 100.5* 99.0  PLT 110* 107*   Cardiac Enzymes: Recent Labs    04/23/23 1358 04/23/23 1607  TROPONINIHS 20* 19*   BNP: Recent Labs    04/23/23 1358  BNP 1,516.7*   D-Dimer: No results for input(s): "DDIMER" in the last 72 hours. Hemoglobin A1C: No results for input(s): "HGBA1C" in the last 72 hours. Fasting Lipid Panel: No results for input(s): "CHOL", "HDL", "LDLCALC", "TRIG", "CHOLHDL", "LDLDIRECT" in the last 72 hours. Thyroid Function Tests: No results for input(s): "TSH", "T4TOTAL", "T3FREE", "THYROIDAB" in the last 72 hours.  Invalid input(s): "FREET3" Anemia Panel: No results for input(s): "VITAMINB12", "FOLATE", "FERRITIN", "TIBC", "IRON", "RETICCTPCT" in the last 72 hours.   Radiology: US Paracentesis  Result Date: 04/24/2023 INDICATION: Patient with past medical history of congestive heart failure and chronic kidney disease with hepatic steatosis with recurrent ascites request received for therapeutic paracentesis. EXAM: ULTRASOUND GUIDED  PARACENTESIS MEDICATIONS: Local 1% lidocaine only. COMPLICATIONS: None immediate. PROCEDURE: Informed written consent was obtained from the patient after a discussion of the risks, benefits and alternatives to treatment. A timeout was performed prior to the initiation of the procedure. Initial ultrasound scanning demonstrates a large amount of ascites within the right lower abdominal quadrant. The right lower abdomen was prepped and draped in the usual sterile fashion. 1% lidocaine was used for local anesthesia. Following this, a 19 gauge, 7-cm, Yueh catheter was introduced. An ultrasound image was saved  for documentation purposes. The paracentesis was performed. The catheter was removed and a dressing was applied. The patient tolerated the procedure well without immediate post procedural complication. Albumin ordered per primary team. FINDINGS: A total of approximately 6.8 Liters of amber colored fluid was removed. IMPRESSION: Successful ultrasound-guided paracentesis yielding 6.8 liters of peritoneal fluid. This exam was performed by Pattricia Boss PA-C, and was supervised and interpreted by Dr. Juliette Alcide. Electronically Signed   By: Olive Bass M.D.   On: 04/24/2023 10:41   DG Shoulder Right  Result Date: 04/23/2023 CLINICAL DATA:  Trauma, fall, pain EXAM: RIGHT SHOULDER - 2+ VIEW COMPARISON:  None Available. FINDINGS: No recent fracture or dislocation is seen. Deformity in the shaft of right clavicle has not changed in comparison with the study done on 04/03/2023 suggesting old healed fracture. IMPRESSION: No recent fracture or dislocation is seen in right shoulder. Electronically Signed   By: Ernie Avena M.D.   On: 04/23/2023 14:12   DG Chest Port 1 View  Result Date: 04/23/2023 CLINICAL DATA:  Trauma, fall EXAM: PORTABLE CHEST 1 VIEW COMPARISON:  04/03/2023 FINDINGS: Heart is enlarged in size. There is previous coronary bypass surgery. Pacemaker/defibrillator battery is seen in left infraclavicular region. Central pulmonary vessels are prominent. Increased interstitial markings are seen in the parahilar regions and lower lung fields. There is blunting of right lateral CP angle. There is no pneumothorax. IMPRESSION: Cardiomegaly. Increased interstitial markings are seen in the parahilar regions and lower lung fields  suggesting possible mild interstitial pulmonary edema. There is no focal pulmonary consolidation. Small right pleural effusion. Electronically Signed   By: Ernie Avena M.D.   On: 04/23/2023 14:10   CT Head Wo Contrast  Result Date: 04/23/2023 CLINICAL DATA:  Head trauma,  moderate-severe; Neck trauma (Age >= 65y). EXAM: CT HEAD WITHOUT CONTRAST CT CERVICAL SPINE WITHOUT CONTRAST TECHNIQUE: Multidetector CT imaging of the head and cervical spine was performed following the standard protocol without intravenous contrast. Multiplanar CT image reconstructions of the cervical spine were also generated. RADIATION DOSE REDUCTION: This exam was performed according to the departmental dose-optimization program which includes automated exposure control, adjustment of the mA and/or kV according to patient size and/or use of iterative reconstruction technique. COMPARISON:  Head CT 10/27/2022. FINDINGS: CT HEAD FINDINGS Brain: No acute hemorrhage. Unchanged old infarct in the medial aspect of the left occipital lobe. No new loss of gray-white differentiation. No hydrocephalus or extra-axial collection. No mass effect or midline shift. Vascular: No hyperdense vessel or unexpected calcification. Skull: No acute fracture. Old fracture deformity along left parietal and squamous temporal bones. Sinuses/Orbits: Chronic bilateral maxillary and sphenoid sinusitis. Orbits are unremarkable. Other: None. CT CERVICAL SPINE FINDINGS Alignment: Normal. Skull base and vertebrae: No acute fracture. Normal craniocervical junction. No suspicious bone lesions. Soft tissues and spinal canal: No prevertebral fluid or swelling. No visible canal hematoma. Disc levels: Multilevel cervical spondylosis, worst at C6-7, where there is at least moderate spinal canal stenosis. Upper chest: Large right pleural effusion. Other: Left carotid stent in place. IMPRESSION: 1. No acute intracranial abnormality. Old left PCA territory infarct. 2. No acute cervical spine fracture or traumatic malalignment. 3. Large right pleural effusion. Electronically Signed   By: Orvan Falconer M.D.   On: 04/23/2023 13:56   CT Cervical Spine Wo Contrast  Result Date: 04/23/2023 CLINICAL DATA:  Head trauma, moderate-severe; Neck trauma (Age >=  65y). EXAM: CT HEAD WITHOUT CONTRAST CT CERVICAL SPINE WITHOUT CONTRAST TECHNIQUE: Multidetector CT imaging of the head and cervical spine was performed following the standard protocol without intravenous contrast. Multiplanar CT image reconstructions of the cervical spine were also generated. RADIATION DOSE REDUCTION: This exam was performed according to the departmental dose-optimization program which includes automated exposure control, adjustment of the mA and/or kV according to patient size and/or use of iterative reconstruction technique. COMPARISON:  Head CT 10/27/2022. FINDINGS: CT HEAD FINDINGS Brain: No acute hemorrhage. Unchanged old infarct in the medial aspect of the left occipital lobe. No new loss of gray-white differentiation. No hydrocephalus or extra-axial collection. No mass effect or midline shift. Vascular: No hyperdense vessel or unexpected calcification. Skull: No acute fracture. Old fracture deformity along left parietal and squamous temporal bones. Sinuses/Orbits: Chronic bilateral maxillary and sphenoid sinusitis. Orbits are unremarkable. Other: None. CT CERVICAL SPINE FINDINGS Alignment: Normal. Skull base and vertebrae: No acute fracture. Normal craniocervical junction. No suspicious bone lesions. Soft tissues and spinal canal: No prevertebral fluid or swelling. No visible canal hematoma. Disc levels: Multilevel cervical spondylosis, worst at C6-7, where there is at least moderate spinal canal stenosis. Upper chest: Large right pleural effusion. Other: Left carotid stent in place. IMPRESSION: 1. No acute intracranial abnormality. Old left PCA territory infarct. 2. No acute cervical spine fracture or traumatic malalignment. 3. Large right pleural effusion. Electronically Signed   By: Orvan Falconer M.D.   On: 04/23/2023 13:56   US Paracentesis  Result Date: 04/04/2023 INDICATION: 82 year old male with CHF exacerbation with recurrent ascites. Request received for diagnostic and  therapeutic paracentesis. EXAM: ULTRASOUND GUIDED DIAGNOSTIC AND THERAPEUTIC LEFT LOWER QUADRANT PARACENTESIS MEDICATIONS: 20 mL 1% lidocaine COMPLICATIONS: None immediate. PROCEDURE: Informed written consent was obtained from the patient after a discussion of the risks, benefits and alternatives to treatment. A timeout was performed prior to the initiation of the procedure. Initial ultrasound scanning demonstrates a moderate amount of ascites within the left lower abdominal quadrant. The right lower abdomen was prepped and draped in the usual sterile fashion. 1% lidocaine was used for local anesthesia. Following this, a 19 gauge, 7-cm, Yueh catheter was introduced. An ultrasound image was saved for documentation purposes. The paracentesis was performed. The catheter was removed and a dressing was applied. The patient tolerated the procedure well without immediate post procedural complication. FINDINGS: A total of approximately 6.6 L of clear, amber fluid was removed. Samples were sent to the laboratory as requested by the clinical team. IMPRESSION: Successful ultrasound-guided paracentesis yielding 6.6 liters of peritoneal fluid. Procedure performed by Alex Gardener, NP and supervised by Malachy Moan, MD Electronically Signed   By: Malachy Moan M.D.   On: 04/04/2023 12:51   CT ABDOMEN PELVIS WO CONTRAST  Result Date: 04/03/2023 CLINICAL DATA:  Abdominal pain EXAM: CT ABDOMEN AND PELVIS WITHOUT CONTRAST TECHNIQUE: Multidetector CT imaging of the abdomen and pelvis was performed following the standard protocol without IV contrast. RADIATION DOSE REDUCTION: This exam was performed according to the departmental dose-optimization program which includes automated exposure control, adjustment of the mA and/or kV according to patient size and/or use of iterative reconstruction technique. COMPARISON:  CT 02/25/2023 FINDINGS: Lower chest: Enlarged heart. Status post median sternotomy. Defibrillator leads along the  right side of the heart. Persistent small to moderate right pleural effusion. There is some bilateral areas of pleural thickening and some calcification. There is some bandlike changes along the lung bases once again. Areas of interstitial septal thickening. Hepatobiliary: Nodular contours of the liver. No obvious lesion on noncontrast imaging. Gallbladder is nondilated. Pancreas: Moderate atrophy of the pancreas, unchanged. Spleen: Spleen is nonenlarged. Adrenals/Urinary Tract: Adrenal glands are preserved. Severe atrophy of the left kidney. No abnormal calcification seen within either kidney nor along the course of either ureter. Preserved contours of the urinary bladder. 7 mm hyperdense lesion along the upper pole of the right kidney, unchanged from previous. Bosniak 2 lesion. No specific follow-up. Stomach/Bowel: Stomach is distended with fluid. The large bowel has a normal course and caliber with scattered stool. Left-sided colonic diverticula. Small bowel is nondilated. Third portion duodenal diverticulum. Vascular/Lymphatic: Normal caliber IVC. Aortic endograft in place once again, incompletely evaluated on this noncontrast examination. Scattered vascular calcifications otherwise. There are some small nodes identified in the retroperitoneum and pelvis which are not pathologic by size criteria and unchanged from previous. Reproductive: Heterogeneous prostate. Other: Moderate ascites once again identified, similar to previous. Anasarca. There is some fluid along the right inguinal canal. Musculoskeletal: Moderate degenerative changes of the spine and pelvis. Trace retrolisthesis of L2 on L3 and L1 on L2. Multilevel stenosis. IMPRESSION: Overall appearance is similar to previous. Moderate ascites once again identified with stranding and anasarca. Nodular liver. No bowel obstruction, free air.  Colonic diverticula. Aortic endograft incompletely evaluated without contrast. Severe atrophy of the left kidney.  Enlarged heart. Postop chest with defibrillator. Stable moderate right pleural effusion. Bilateral pleural thickening with calcification. Please correlate for any history of the environmental exposure. Electronically Signed   By: Karen Kays M.D.   On: 04/03/2023 12:31   DG Chest Portable 1 View  Result Date: 04/03/2023 CLINICAL DATA:  Shortness of breath EXAM: PORTABLE CHEST 1 VIEW COMPARISON:  CXR 02/25/23 FINDINGS: Left-sided dual lead cardiac device with unchanged lead positioning. Status post median sternotomy and CABG. Unchanged enlarged cardiac contours. Small right and likely a layering left-sided pleural effusion. There are hazy bibasilar airspace opacity which could represent atelectasis or infection. There are prominent bilateral interstitial opacities, favored to represent pulmonary edema. No radiographically apparent displaced rib fractures IMPRESSION: 1. Cardiomegaly, mild to moderate pulmonary edema, and small bilateral pleural effusions. 2. Hazy bibasilar airspace opacities could represent atelectasis or infection. Electronically Signed   By: Lorenza Cambridge M.D.   On: 04/03/2023 11:36    ECHO  TEE 02/17/2023  1. TEE TO ASSESS MV REGURGITATION.    2. NORMAL APPEARING MV WITH FUNCTIONAL MODERATE MR (PISA radius = 0.6 cm,    VC = 0.45 cm, EROA = 0.14 cm2, Regurg Volume = 23 mL). THERE IS BLUNTING    OF THE PULMONARY VEIN S WAVE WITHOUT REVERSAL.    3. SEVERE LV AND RV SYSTOLIC DYSFUNCTION EF = 20%.    4. MILD AR, TRIVIAL PR, MODERATE TR.    5. NO LAA THROMBUS PRESENT.   TELEMETRY reviewed by me (LT) 04/24/2023 : none available   EKG reviewed by me: AF 64 incomplete RBBB similar to priors  Data reviewed by me (LT) 04/24/2023: ed note, admission H&P, IR note, last 24h vitals tele labs imaging I/O    Principal Problem:   Acute on chronic combined systolic and diastolic CHF (congestive heart failure) (HCC) Active Problems:   CAD S/P percutaneous coronary angioplasty   Cardiac  defibrillator in place   Chronic kidney disease, stage 4 (severe) (HCC)   Hypercholesteremia   Obstructive sleep apnea   History of abdominal aortic aneurysm (AAA)   History of CVA (cerebrovascular accident)   Essential (primary) hypertension   Atrial fibrillation (HCC)   Aortic atherosclerosis (HCC)   Atherosclerotic heart disease of native coronary artery with unspecified angina pectoris (HCC)   Implantable cardioverter-defibrillator (ICD) in situ   Acute pulmonary edema (HCC)   DOE (dyspnea on exertion)   Chronic obstructive pulmonary disease, unspecified COPD type (HCC)   Abdominal ascites    ASSESSMENT AND PLAN:  Matther Doupe. Antal is an 77yoM with a PMH of PMH of chronic HFrEF (EF 20%, moderate MR 02/17/2023), CAD s/p CABG 08/2016, hx VF arrest s/p MI in 1994 s/p dual-chamber ICD (2014) and recent battery change out 02/13/2023, paroxysmal AF (Eliquis), CKD 4, AAA s/p EVAR (2008), COPD, DM2 who presented to [facility] ED 04/23/2023 after a fall. Cardiology is consulted for assistance with his HF.   # mechanical fall No preceding dizziness, lightheadedness, chest pain. Fortunately no acute orthopedic injury or head injury by imaging.   # acute hypoxic respiratory failure # ? PNA Possible aspiration PNA, on levaquin per primary   # anasarca # acute on chronic HFrEF # moderate MR S/p LVP with IR yielding 6.8L fluid on 5/30. Suspect dietary indiscretions with multiple chronic issues (HFrEF, CKD, and cirrhosis) contributory to repeat admissions.  - s/p IV lasix 80mg  x 1 and IV 20mg  x 1. Agree with continuing 60mg  IV BID for now and resuming torsemide 60mg  PO BID at discharge - continue coreg 6.25mg  BID, other GDMT with ARB/ARNI, SGLT2i, MRA limited by CKD + BP  - stressed importance of adherence to low sat diet + fluid restriction. <1L per day per nephrology recommendations - defer additional cardiac diagnostics - will need close follow  up with advanced HF at discharge.   # CKD 4 Monitor  closely with diuresis.  BUN/CR today 61/3.33 and GFR 18.   # paroxysmal AF  # hx VF arrest s/p medtronic dual-chamber ICD In rate controlled AF by admission EKG. Not presently on tele.  - continue coreg and eliquis 2.5mg  BID for stroke prevention  # CAD s/p CABG  # Demand ischemia Chest pain-free.  Troponin 20, 19. C/w demand in the setting of acute on chronic CHF and chronically elevated with renal insufficiency and not ACS. - continue aspirin 81mg  daily  - continue atorva 40mg  daily - continue imdur 15mg  daily   This patient's plan of care was discussed and created with Dr. Juliann Pares and he is in agreement.  Signed: Rebeca Allegra , PA-C 04/24/2023, 1:06 PM [facility] Cardiology

## 2023-04-24 NOTE — Progress Notes (Signed)
Pt refused CPAP. Pt does wear at home but does not wish to wear here. Pt aware that one will be available if he changes his mind.

## 2023-04-24 NOTE — Consult Note (Addendum)
   Heart Failure Nurse Navigator Note  HFrEF less than 20%.  Left ventricular internal cavity is severely dilated.  Mild LVH.  Grade 2 diastolic dysfunction.  Severe left atrial enlargement.  Moderate right atrial enlargement.  Severe mitral regurgitation.  Moderate to severe tricuspid regurgitation.  Mild pulmonary insufficiency.  He presented to the emergency room after he suffered a fall, also complaining of increasing shortness of breath and weight gain.  BNP 1516.  Chest x-ray suggestive of mild interstitial pulmonary edema.  Mild right pleural effusion.  He had last been admitted on May 9 and discharged on Apr 03, 2022 for heart failure.  Comorbidities:  Hyperlipidemia Obstructive sleep apnea Hepatic steatosis Atrial fibrillation Coronary artery disease/CABG/PCI Repair of abdominal aortic aneurysm  Medications:  Atorvastatin 40 mg daily Carvedilol 6.25 mg 2 times a day with meals Furosemide 60 mg IV 2 times a day Isosorbide mononitrate 15 mg daily Aspirin 81 mg daily  Labs:  Sodium 139, potassium 3.7, chloride 100, CO2 30, BUN 61, creatinine 3.33, estimated GFR 18 Weight 88.5 kg Intake not documented Output 300 mL urine, paracentesis 6.8 L  Initial meeting with patient on this admission, he had just returned from having paracentesis.  Patient states that he still has some back pain but does not have the abdominal discomfort.  There were no family members present at the bedside.  States at home he had noted a weight gain along with increasing scrotal edema which he states that he has never had before.  Also discussed why he did not call  the heart failure clinic nor come to his appointment.  He states that he had just been so busy with other appointments.  Discussed dietary habits along with his fluid intake.  Patient felt that he was not drinking any more than 30 ounces daily. Admits to sucking on ice.  Reminded that 1 cup of ice is 1/2 cup of liquid.  Felt he has been  compliant with his medications along with being compliant with diet.  Based on his GFR he does not qualify for the [facility].  Stressed follow-up in the outpatient heart failure clinic for which he has an appointment on June 4 at 230.  He has a 5% no-show which is 7 out of 144 appointments.  He had no further questions.  Tresa Endo RN

## 2023-04-25 DIAGNOSIS — I2510 Atherosclerotic heart disease of native coronary artery without angina pectoris: Secondary | ICD-10-CM

## 2023-04-25 DIAGNOSIS — N184 Chronic kidney disease, stage 4 (severe): Secondary | ICD-10-CM | POA: Diagnosis not present

## 2023-04-25 DIAGNOSIS — R188 Other ascites: Secondary | ICD-10-CM | POA: Diagnosis not present

## 2023-04-25 DIAGNOSIS — J810 Acute pulmonary edema: Secondary | ICD-10-CM | POA: Diagnosis not present

## 2023-04-25 DIAGNOSIS — Z9861 Coronary angioplasty status: Secondary | ICD-10-CM

## 2023-04-25 DIAGNOSIS — I5043 Acute on chronic combined systolic (congestive) and diastolic (congestive) heart failure: Secondary | ICD-10-CM | POA: Diagnosis not present

## 2023-04-25 LAB — CBC
HCT: 39.1 % (ref 39.0–52.0)
Hemoglobin: 11.9 g/dL — ABNORMAL LOW (ref 13.0–17.0)
MCH: 30.1 pg (ref 26.0–34.0)
MCHC: 30.4 g/dL (ref 30.0–36.0)
MCV: 98.7 fL (ref 80.0–100.0)
Platelets: 102 10*3/uL — ABNORMAL LOW (ref 150–400)
RBC: 3.96 MIL/uL — ABNORMAL LOW (ref 4.22–5.81)
RDW: 19.3 % — ABNORMAL HIGH (ref 11.5–15.5)
WBC: 5.7 10*3/uL (ref 4.0–10.5)
nRBC: 0 % (ref 0.0–0.2)

## 2023-04-25 LAB — BASIC METABOLIC PANEL
Anion gap: 9 (ref 5–15)
BUN: 64 mg/dL — ABNORMAL HIGH (ref 8–23)
CO2: 28 mmol/L (ref 22–32)
Calcium: 8.5 mg/dL — ABNORMAL LOW (ref 8.9–10.3)
Chloride: 99 mmol/L (ref 98–111)
Creatinine, Ser: 3.2 mg/dL — ABNORMAL HIGH (ref 0.61–1.24)
GFR, Estimated: 19 mL/min — ABNORMAL LOW (ref >60)
Glucose, Bld: 105 mg/dL — ABNORMAL HIGH (ref 70–99)
Potassium: 3.8 mmol/L (ref 3.5–5.1)
Sodium: 136 mmol/L (ref 135–145)

## 2023-04-25 NOTE — Assessment & Plan Note (Signed)
Worsening cough for the past couple of week, Calcitonin added 0.25 Started him on levofloxacin Home DuoNebs every 4 hours as needed for wheezing and shortness of breath resumed

## 2023-04-25 NOTE — Progress Notes (Signed)
Pt unable to lay flat in bed d/t SOB even though he is 99% on room air. Pt assisted to sit up at 30 degrees per his preference.

## 2023-04-25 NOTE — Progress Notes (Signed)
Progress Note   Patient: Taylor [name] ZOX:[id number] DOB: Jun 16, 1941 DOA: 04/23/2023     2 DOS: the patient was seen and examined on 04/25/2023   Brief hospital course: 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  Vitals in the ED showed temperature 98, respiration rate of 18, heart rate of 62, blood pressure 168/83, SpO2 of 96% on room air.  Serum sodium is 140, potassium 3.6, chloride 101, bicarb 29, BUN of 62, serum creatinine of 3.17, EGFR of 19, nonfasting blood glucose 91, WBC 4.3, hemoglobin 11.8, platelets of 110.  Alk phos was elevated at 140.  Albumin level was 2.6.  BNP was elevated 1516.7, high sensitive troponin was 20.  ED treatment: Furosemide 80 mg IV one-time dose  5/30: Vital stable.  Slight worsening of creatinine to 3.33 which remained around his baseline.  S/p paracentesis with removal of 6.8 L of amber-colored fluid. Worsening cough, procalcitonin at 0.25-started p.o. levofloxacin. Prior echocardiogram with EF of less than 20%, his cardiologist was also consulted.  Established patient with [facility] clinic. 5/31: Hemodynamically stable and clinically improving.  Cardiology would like to continue with IV diuresis.  Renal function stable   Assessment and Plan: * Acute on chronic combined systolic and diastolic CHF (congestive heart failure) (HCC) Patient had complete echo on 02/17/2023: Severe LV and RV systolic dysfunction, estimated ejection fraction 20%, mild AR, trivial PR, moderate TR Status post  furosemide 80 mg IV one-time dose per EDP Continue furosemide 60 mg IV twice daily  Strict I's and O's Daily weight and BMP Cardiology consult-  Abdominal ascites Presumed secondary to acute on chronic combined heart failure exacerbation in setting of hepatic steatosis S/p ultrasound-guided paracentesis with removal of 6.5 L of -Continue with IV diuresis  Acute pulmonary edema (HCC) Presumed secondary to heart failure exacerbation, chest clear today -Continue with IV diuresis  Chronic kidney disease, stage 4 (severe) (HCC) Creatinine seems stable around baseline. -Monitor renal function as patient is being diuresed -Avoid nephrotoxins  Chronic obstructive pulmonary disease, unspecified COPD type (HCC) Worsening cough for the past couple of week, Calcitonin added 0.25 Started him on levofloxacin Home DuoNebs every 4 hours as needed for wheezing and shortness of breath resumed  Essential (primary) hypertension Home Coreg 6.25 mg p.o. twice daily with meals resumed, Imdur 50 mg daily resumed  Atrial fibrillation (HCC) Home Eliquis not resumed on admission due to recent fall with multiple skin lacerations, ecchymosis on the right arm and shoulder and ultrasound-guided paracentesis ordered on admission. -Restarting home Eliquis as hemoglobin seems stable   CAD S/P percutaneous coronary angioplasty No chest pain. -Continue with home aspirin, carvedilol and statin  Obstructive sleep apnea -CPAP at night  Hypercholesteremia Home atorvastatin 40 mg daily resumed  DOE (dyspnea on exertion) Presumed secondary to pulmonary edema in setting of combined acute on chronic heart failure exacerbation  Acute exacerbation of CHF (congestive heart failure) (HCC)-resolved as of 04/23/2023 Patient had complete echo on 02/17/2023 which was read as: Severe LV and RV systolic dysfunction, estimated ejection fraction is 20%.  Mild AR, trivial PR, moderate TR. Strict I's and O's  Status post  furosemide 80 mg IV one-time dose per EDP I have ordered furosemide 60 mg IV twice daily for 5/30/104 Admit to telemetry cardiac, inpatient    Subjective: Patient was seen and examined today.  Cough and shortness of breath seems improving.  Physical Exam: Vitals:   04/25/23 0300 04/25/23 0600 04/25/23 1000 04/25/23 1100  BP: 105/78 112/78 107/76 113/82  Pulse: 60 64 61 62  Resp: (!) 24 14 17 13   Temp:  (!) 97.5 F (36.4 C) (!) 97.4 F (36.3 C)   TempSrc:  Oral Oral   SpO2: 99% 98% 100% 98%  Weight:      Height:       General.  Frail elderly man, in no acute distress. Pulmonary.  Lungs clear bilaterally, normal respiratory effort. CV.  Regular rate and rhythm, no JVD, rub or murmur. Abdomen.  Soft, nontender, nondistended, BS positive. CNS.  Alert and oriented .  No focal neurologic deficit. Extremities.  1+ LE edema, no cyanosis, pulses intact and symmetrical. Psychiatry.  Judgment and insight appears normal.    Data Reviewed: Prior data reviewed  Family Communication: Discussed with wife on phone  Disposition: Status is: Inpatient Remains inpatient appropriate because: Severity of illness  Planned Discharge Destination: Home  DVT prophylaxis.  Eliquis Time spent: 45 minutes  This record has been created using Conservation officer, historic buildings. Errors have been sought and corrected,but may not always be located. Such creation errors do not reflect on the standard of care.   Author: Arnetha Courser, MD 04/25/2023 2:45 PM  For on call review [email or web address].

## 2023-04-25 NOTE — Assessment & Plan Note (Signed)
Presumed secondary to heart failure exacerbation, chest clear today -Continue with IV diuresis

## 2023-04-25 NOTE — Assessment & Plan Note (Addendum)
Patient had complete echo on 02/17/2023: Severe LV and RV systolic dysfunction, estimated ejection fraction 20%, mild AR, trivial PR, moderate TR Status post furosemide 80 mg IV one-time dose per EDP Worsening renal function today. Holding Lasix and closely monitoring volume status, might need Lasix infusion for gentle diuresis.  Not much output recorded. Strict I's and O's Daily weight and BMP Cardiology consult-

## 2023-04-25 NOTE — Plan of Care (Signed)

## 2023-04-25 NOTE — Progress Notes (Signed)
Pt coughing up slightly thick light green sputum occasionally.

## 2023-04-25 NOTE — Assessment & Plan Note (Signed)
Presumed secondary to acute on chronic combined heart failure exacerbation in setting of hepatic steatosis Ultrasound-guided paracentesis has been ordered As needed albumin one-time dose has been ordered 

## 2023-04-25 NOTE — Progress Notes (Signed)
[facility] CLINIC CARDIOLOGY CONSULT NOTE       Patient ID: JUVENCIO HALVORSON MRN: [id number] DOB/AGE: 1941/05/01 82 y.o.  Admit date: 04/23/2023 Referring Physician Dr. Arnetha Courser  Primary Physician Dr. Sherrie Mustache Primary Cardiologist Dr. Darrold Junker Reason for Consultation AoCHF  HPI: Belford Cascante. Tuomi is an 4yoM with a PMH of PMH of chronic HFrEF (EF 20%, moderate MR 02/17/2023), CAD s/p CABG 08/2016, hx VF arrest s/p MI in 1994 s/p dual-chamber ICD (2014) and recent battery change out 02/13/2023, paroxysmal AF (Eliquis), CKD 4, AAA s/p EVAR (2008), COPD, DM2 who presented to [facility] ED 04/23/2023 after a fall. Cardiology is consulted for assistance with his HF.   Interval History:  - Reports feeling better overall this AM.  - Endorses cough and mild SOB with laying flat. Currently on 2L [place].  - Slight improvement in creatinine today.  Review of systems complete and found to be negative unless listed above     Past Medical History:  Diagnosis Date   AAA (abdominal aortic aneurysm) (HCC) 06/03/2007   [facility]; Dr. Hart Rochester   AICD (automatic cardioverter/defibrillator) present    Arrhythmia    atrial fibrillation   Barrett's esophagus    Bladder cancer (HCC)    Bradycardia    CAD (coronary artery disease)    CAP (community acquired pneumonia) 11/13/2019   CHF (congestive heart failure) (HCC)    Cluster headache    COVID-19 09/27/2021   DDD (degenerative disc disease), lumbar    Diabetes mellitus without complication (HCC)    Dyspnea    WITH EXERTION   Edema    LEFT ANKLE   Fracture of skull base (HCC) 1997   due to fall   GERD (gastroesophageal reflux disease)    Gout    History of bladder cancer 12/1995   Hyperlipidemia    Hypertension    Hypocalcemia 04/19/2020   Possibly secondary to diuretics.   Low=5.9 04/19/2020   Malignant melanoma (HCC) 12/2012   right dorsal forearm excised   Myocardial infarction [facility])    LAST 2014   Osteoarthritis of knee    Other  specified complication of vascular prosthetic devices, implants and grafts, initial encounter (HCC) 08/22/2021   Pacemaker 10/10/2006   Pancreatitis 11/22/2019   Pneumonia    2016   Pre-diabetes    Psoriasis    Rib fracture 1997   due to fall   Sleep apnea    CPAP   Stroke [facility])    Venous incompetence     Past Surgical History:  Procedure Laterality Date   ABDOMINAL AORTIC ANEURYSM REPAIR  06/03/2007   [facility]; Dr. Hart Rochester   ANGIOPLASTY  1994   MI   BLADDER TUMOR EXCISION  12/1995   CAROTID PTA/STENT INTERVENTION Left 07/23/2022   Procedure: CAROTID PTA/STENT INTERVENTION;  Surgeon: Renford Dills, MD;  Location: [facility] INVASIVE CV LAB;  Service: Cardiovascular;  Laterality: Left;   CATARACT EXTRACTION W/PHACO Left 10/22/2016   Procedure: CATARACT EXTRACTION PHACO AND INTRAOCULAR LENS PLACEMENT (IOC);  Surgeon: Galen Manila, MD;  Location: [facility] ORS;  Service: Ophthalmology;  Laterality: Left;  [place] 47.7 AP% 18.4 CDE 8.78 Fluid pack lot # [id number]   CATARACT EXTRACTION W/PHACO Right 12/10/2016   Procedure: CATARACT EXTRACTION PHACO AND INTRAOCULAR LENS PLACEMENT (IOC);  Surgeon: Galen Manila, MD;  Location: [facility] ORS;  Service: Ophthalmology;  Laterality: Right;  [place] 00:39 AP% 23.3 CDE 9.13 Fluid pack lot # [id number]   CORONARY ANGIOPLASTY     STENTS X  5   CORONARY ARTERY BYPASS GRAFT  09/22/2006   four   ELBOW BURSA SURGERY     DUE TO GOUT   INSERT / REPLACE / REMOVE PACEMAKER     MELANOMA EXCISION  12/2012   Right forearm   PPM GENERATOR CHANGEOUT N/A 02/13/2023   Procedure: PPM GENERATOR CHANGEOUT;  Surgeon: Marcina Millard, MD;  Location: [facility] INVASIVE CV LAB;  Service: Cardiovascular;  Laterality: N/A;    (Not in a hospital admission)  Social History   Socioeconomic History   Marital status: Married    Spouse name: [name]   Number of children: 3   Years of education: Not on file   Highest education level: 12th grade   Occupational History   Occupation: retired    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  Independent at baseline.   Social Determinants of Health   Financial Resource Strain: Low Risk  (05/07/2022)   Overall Financial Resource Strain (CARDIA)    Difficulty of Paying Living Expenses: Not hard at all  Food Insecurity: No Food Insecurity (04/24/2023)   Hunger Vital Sign    Worried About Running Out of Food in the Last Year: Never true    Ran Out of Food in the Last Year: Never true  Transportation Needs: No Transportation Needs (04/24/2023)   PRAPARE - Administrator, Civil Service (Medical): No    Lack of Transportation (Non-Medical): No  Physical Activity: Insufficiently Active (04/18/2021)   Exercise Vital Sign    Days of Exercise per Week: 1 day    Minutes of Exercise per Session: 120 min  Stress: No Stress Concern Present (05/07/2022)   [employer] of Occupational Health - Occupational Stress Questionnaire    Feeling of Stress : Not at all  Social Connections: Moderately Isolated (05/07/2022)   Social Connection and Isolation Panel [NHANES]    Frequency of Communication with Friends and Family: Never    Frequency of Social Gatherings with Friends and Family: Once a week    Attends Religious Services: More than 4 times per year    Active Member of [employer] or Organizations: No    Attends Banker Meetings: Never    Marital Status: Married  Catering manager Violence: Not At Risk (04/24/2023)    Humiliation, Afraid, Rape, and Kick questionnaire    Fear of Current or Ex-Partner: No    Emotionally Abused: No    Physically Abused: No    Sexually Abused: No    Family History  Problem Relation Age of Onset   Cancer Mother        Melanoma skin cancer   Heart attack Father 76   Cancer Father        throat cancer   Arthritis Brother      No intake or output data in the 24 hours ending 04/25/23 1134   Vitals:   04/25/23  0300 04/25/23 0600 04/25/23 1000 04/25/23 1100  BP: 105/78 112/78 107/76 113/82  Pulse: 60 64 61 62  Resp: (!) 24 14 17 13   Temp:  (!) 97.5 F (36.4 C) (!) 97.4 F (36.3 C)   TempSrc:  Oral Oral   SpO2: 99% 98% 100% 98%  Weight:      Height:        PHYSICAL EXAM General: chronically ill appearing elderly male, well nourished, in no acute distress laying nearly flat in bed. HEENT:  Normocephalic and atraumatic. Neck:  No JVD.  Lungs: Normal respiratory effort on 2L. Dry cough. Decreased breath sounds L base with bibasilar crackles in bases  Heart: HRRR . Normal S1 and S2 without gallops or murmurs.  Abdomen: Non-distended appearing.  Msk: Normal strength and tone for age. Extremities: Warm and well perfused. No clubbing, cyanosis. Trace bilateral LE edema.  Neuro: Alert and oriented X 3. Psych:  Answers questions appropriately.   Labs: Basic Metabolic Panel: Recent Labs    04/24/23 0456 04/25/23 0613  NA 139 136  K 3.7 3.8  CL 100 99  CO2 30 28  GLUCOSE 104* 105*  BUN 61* 64*  CREATININE 3.33* 3.20*  CALCIUM 8.8* 8.5*   Liver Function Tests: Recent Labs    04/23/23 1358  AST 31  ALT 10  ALKPHOS 140*  BILITOT 2.3*  PROT 6.8  ALBUMIN 2.6*   No results for input(s): "LIPASE", "AMYLASE" in the last 72 hours. CBC: Recent Labs    04/23/23 1358 04/24/23 0456 04/25/23 0613  WBC 4.3 3.9* 5.7  NEUTROABS 2.6  --   --   HGB 11.8* 12.3* 11.9*  HCT 39.9 40.4 39.1  MCV 100.5* 99.0 98.7  PLT 110* 107* 102*   Cardiac  Enzymes: Recent Labs    04/23/23 1358 04/23/23 1607  TROPONINIHS 20* 19*   BNP: Recent Labs    04/23/23 1358  BNP 1,516.7*   D-Dimer: No results for input(s): "DDIMER" in the last 72 hours. Hemoglobin A1C: No results for input(s): "HGBA1C" in the last 72 hours. Fasting Lipid Panel: No results for input(s): "CHOL", "HDL", "LDLCALC", "TRIG", "CHOLHDL", "LDLDIRECT" in the last 72 hours. Thyroid Function Tests: No results for input(s): "TSH", "T4TOTAL", "T3FREE", "THYROIDAB" in the last 72 hours.  Invalid input(s): "FREET3" Anemia Panel: No results for input(s): "VITAMINB12", "FOLATE", "FERRITIN", "TIBC", "IRON", "RETICCTPCT" in the last 72 hours.   Radiology: US Paracentesis  Result Date: 04/24/2023 INDICATION: Patient with past medical history of congestive heart failure and chronic kidney disease with hepatic steatosis with recurrent ascites request received for therapeutic paracentesis. EXAM: ULTRASOUND GUIDED  PARACENTESIS MEDICATIONS: Local 1% lidocaine only. COMPLICATIONS: None immediate. PROCEDURE: Informed written consent was obtained from the patient after a discussion of the risks, benefits and alternatives to treatment. A timeout was performed prior to the initiation of the procedure. Initial ultrasound scanning demonstrates a large amount of ascites within the right lower abdominal quadrant. The right lower abdomen was prepped and draped in the usual sterile fashion. 1% lidocaine was used for local anesthesia. Following this, a 19 gauge, 7-cm, Yueh catheter was introduced. An ultrasound image was saved for documentation purposes. The paracentesis was performed. The catheter was removed and a dressing was applied. The patient tolerated the procedure well without immediate post procedural complication. Albumin ordered per primary team. FINDINGS: A total of approximately 6.8 Liters of amber colored fluid was removed. IMPRESSION: Successful ultrasound-guided paracentesis yielding 6.8  liters of peritoneal fluid. This exam was performed by Pattricia Boss  PA-C, and was supervised and interpreted by Dr. Juliette Alcide. Electronically Signed   By: Olive Bass M.D.   On: 04/24/2023 10:41   DG Shoulder Right  Result Date: 04/23/2023 CLINICAL DATA:  Trauma, fall, pain EXAM: RIGHT SHOULDER - 2+ VIEW COMPARISON:  None Available. FINDINGS: No recent fracture or dislocation is seen. Deformity in the shaft of right clavicle has not changed in comparison with the study done on 04/03/2023 suggesting old healed fracture. IMPRESSION: No recent fracture or dislocation is seen in right shoulder. Electronically Signed   By: Ernie Avena M.D.   On: 04/23/2023 14:12   DG Chest Port 1 View  Result Date: 04/23/2023 CLINICAL DATA:  Trauma, fall EXAM: PORTABLE CHEST 1 VIEW COMPARISON:  04/03/2023 FINDINGS: Heart is enlarged in size. There is previous coronary bypass surgery. Pacemaker/defibrillator battery is seen in left infraclavicular region. Central pulmonary vessels are prominent. Increased interstitial markings are seen in the parahilar regions and lower lung fields. There is blunting of right lateral CP angle. There is no pneumothorax. IMPRESSION: Cardiomegaly. Increased interstitial markings are seen in the parahilar regions and lower lung fields suggesting possible mild interstitial pulmonary edema. There is no focal pulmonary consolidation. Small right pleural effusion. Electronically Signed   By: Ernie Avena M.D.   On: 04/23/2023 14:10   CT Head Wo Contrast  Result Date: 04/23/2023 CLINICAL DATA:  Head trauma, moderate-severe; Neck trauma (Age >= 65y). EXAM: CT HEAD WITHOUT CONTRAST CT CERVICAL SPINE WITHOUT CONTRAST TECHNIQUE: Multidetector CT imaging of the head and cervical spine was performed following the standard protocol without intravenous contrast. Multiplanar CT image reconstructions of the cervical spine were also generated. RADIATION DOSE REDUCTION: This exam was performed  according to the departmental dose-optimization program which includes automated exposure control, adjustment of the mA and/or kV according to patient size and/or use of iterative reconstruction technique. COMPARISON:  Head CT 10/27/2022. FINDINGS: CT HEAD FINDINGS Brain: No acute hemorrhage. Unchanged old infarct in the medial aspect of the left occipital lobe. No new loss of gray-white differentiation. No hydrocephalus or extra-axial collection. No mass effect or midline shift. Vascular: No hyperdense vessel or unexpected calcification. Skull: No acute fracture. Old fracture deformity along left parietal and squamous temporal bones. Sinuses/Orbits: Chronic bilateral maxillary and sphenoid sinusitis. Orbits are unremarkable. Other: None. CT CERVICAL SPINE FINDINGS Alignment: Normal. Skull base and vertebrae: No acute fracture. Normal craniocervical junction. No suspicious bone lesions. Soft tissues and spinal canal: No prevertebral fluid or swelling. No visible canal hematoma. Disc levels: Multilevel cervical spondylosis, worst at C6-7, where there is at least moderate spinal canal stenosis. Upper chest: Large right pleural effusion. Other: Left carotid stent in place. IMPRESSION: 1. No acute intracranial abnormality. Old left PCA territory infarct. 2. No acute cervical spine fracture or traumatic malalignment. 3. Large right pleural effusion. Electronically Signed   By: Orvan Falconer M.D.   On: 04/23/2023 13:56   CT Cervical Spine Wo Contrast  Result Date: 04/23/2023 CLINICAL DATA:  Head trauma, moderate-severe; Neck trauma (Age >= 65y). EXAM: CT HEAD WITHOUT CONTRAST CT CERVICAL SPINE WITHOUT CONTRAST TECHNIQUE: Multidetector CT imaging of the head and cervical spine was performed following the standard protocol without intravenous contrast. Multiplanar CT image reconstructions of the cervical spine were also generated. RADIATION DOSE REDUCTION: This exam was performed according to the departmental  dose-optimization program which includes automated exposure control, adjustment of the mA and/or kV according to patient size and/or use of iterative reconstruction technique. COMPARISON:  Head CT 10/27/2022. FINDINGS: CT HEAD  FINDINGS Brain: No acute hemorrhage. Unchanged old infarct in the medial aspect of the left occipital lobe. No new loss of gray-white differentiation. No hydrocephalus or extra-axial collection. No mass effect or midline shift. Vascular: No hyperdense vessel or unexpected calcification. Skull: No acute fracture. Old fracture deformity along left parietal and squamous temporal bones. Sinuses/Orbits: Chronic bilateral maxillary and sphenoid sinusitis. Orbits are unremarkable. Other: None. CT CERVICAL SPINE FINDINGS Alignment: Normal. Skull base and vertebrae: No acute fracture. Normal craniocervical junction. No suspicious bone lesions. Soft tissues and spinal canal: No prevertebral fluid or swelling. No visible canal hematoma. Disc levels: Multilevel cervical spondylosis, worst at C6-7, where there is at least moderate spinal canal stenosis. Upper chest: Large right pleural effusion. Other: Left carotid stent in place. IMPRESSION: 1. No acute intracranial abnormality. Old left PCA territory infarct. 2. No acute cervical spine fracture or traumatic malalignment. 3. Large right pleural effusion. Electronically Signed   By: Orvan Falconer M.D.   On: 04/23/2023 13:56   US Paracentesis  Result Date: 04/04/2023 INDICATION: 82 year old male with CHF exacerbation with recurrent ascites. Request received for diagnostic and therapeutic paracentesis. EXAM: ULTRASOUND GUIDED DIAGNOSTIC AND THERAPEUTIC LEFT LOWER QUADRANT PARACENTESIS MEDICATIONS: 20 mL 1% lidocaine COMPLICATIONS: None immediate. PROCEDURE: Informed written consent was obtained from the patient after a discussion of the risks, benefits and alternatives to treatment. A timeout was performed prior to the initiation of the procedure.  Initial ultrasound scanning demonstrates a moderate amount of ascites within the left lower abdominal quadrant. The right lower abdomen was prepped and draped in the usual sterile fashion. 1% lidocaine was used for local anesthesia. Following this, a 19 gauge, 7-cm, Yueh catheter was introduced. An ultrasound image was saved for documentation purposes. The paracentesis was performed. The catheter was removed and a dressing was applied. The patient tolerated the procedure well without immediate post procedural complication. FINDINGS: A total of approximately 6.6 L of clear, amber fluid was removed. Samples were sent to the laboratory as requested by the clinical team. IMPRESSION: Successful ultrasound-guided paracentesis yielding 6.6 liters of peritoneal fluid. Procedure performed by Alex Gardener, NP and supervised by Malachy Moan, MD Electronically Signed   By: Malachy Moan M.D.   On: 04/04/2023 12:51   CT ABDOMEN PELVIS WO CONTRAST  Result Date: 04/03/2023 CLINICAL DATA:  Abdominal pain EXAM: CT ABDOMEN AND PELVIS WITHOUT CONTRAST TECHNIQUE: Multidetector CT imaging of the abdomen and pelvis was performed following the standard protocol without IV contrast. RADIATION DOSE REDUCTION: This exam was performed according to the departmental dose-optimization program which includes automated exposure control, adjustment of the mA and/or kV according to patient size and/or use of iterative reconstruction technique. COMPARISON:  CT 02/25/2023 FINDINGS: Lower chest: Enlarged heart. Status post median sternotomy. Defibrillator leads along the right side of the heart. Persistent small to moderate right pleural effusion. There is some bilateral areas of pleural thickening and some calcification. There is some bandlike changes along the lung bases once again. Areas of interstitial septal thickening. Hepatobiliary: Nodular contours of the liver. No obvious lesion on noncontrast imaging. Gallbladder is nondilated.  Pancreas: Moderate atrophy of the pancreas, unchanged. Spleen: Spleen is nonenlarged. Adrenals/Urinary Tract: Adrenal glands are preserved. Severe atrophy of the left kidney. No abnormal calcification seen within either kidney nor along the course of either ureter. Preserved contours of the urinary bladder. 7 mm hyperdense lesion along the upper pole of the right kidney, unchanged from previous. Bosniak 2 lesion. No specific follow-up. Stomach/Bowel: Stomach is distended with fluid. The large  bowel has a normal course and caliber with scattered stool. Left-sided colonic diverticula. Small bowel is nondilated. Third portion duodenal diverticulum. Vascular/Lymphatic: Normal caliber IVC. Aortic endograft in place once again, incompletely evaluated on this noncontrast examination. Scattered vascular calcifications otherwise. There are some small nodes identified in the retroperitoneum and pelvis which are not pathologic by size criteria and unchanged from previous. Reproductive: Heterogeneous prostate. Other: Moderate ascites once again identified, similar to previous. Anasarca. There is some fluid along the right inguinal canal. Musculoskeletal: Moderate degenerative changes of the spine and pelvis. Trace retrolisthesis of L2 on L3 and L1 on L2. Multilevel stenosis. IMPRESSION: Overall appearance is similar to previous. Moderate ascites once again identified with stranding and anasarca. Nodular liver. No bowel obstruction, free air.  Colonic diverticula. Aortic endograft incompletely evaluated without contrast. Severe atrophy of the left kidney. Enlarged heart. Postop chest with defibrillator. Stable moderate right pleural effusion. Bilateral pleural thickening with calcification. Please correlate for any history of the environmental exposure. Electronically Signed   By: Karen Kays M.D.   On: 04/03/2023 12:31   DG Chest Portable 1 View  Result Date: 04/03/2023 CLINICAL DATA:  Shortness of breath EXAM: PORTABLE  CHEST 1 VIEW COMPARISON:  CXR 02/25/23 FINDINGS: Left-sided dual lead cardiac device with unchanged lead positioning. Status post median sternotomy and CABG. Unchanged enlarged cardiac contours. Small right and likely a layering left-sided pleural effusion. There are hazy bibasilar airspace opacity which could represent atelectasis or infection. There are prominent bilateral interstitial opacities, favored to represent pulmonary edema. No radiographically apparent displaced rib fractures IMPRESSION: 1. Cardiomegaly, mild to moderate pulmonary edema, and small bilateral pleural effusions. 2. Hazy bibasilar airspace opacities could represent atelectasis or infection. Electronically Signed   By: Lorenza Cambridge M.D.   On: 04/03/2023 11:36    ECHO  TEE 02/17/2023  1. TEE TO ASSESS MV REGURGITATION.    2. NORMAL APPEARING MV WITH FUNCTIONAL MODERATE MR (PISA radius = 0.6 cm,    VC = 0.45 cm, EROA = 0.14 cm2, Regurg Volume = 23 mL). THERE IS BLUNTING    OF THE PULMONARY VEIN S WAVE WITHOUT REVERSAL.    3. SEVERE LV AND RV SYSTOLIC DYSFUNCTION EF = 20%.    4. MILD AR, TRIVIAL PR, MODERATE TR.    5. NO LAA THROMBUS PRESENT.   TELEMETRY reviewed by me [facility]) 04/25/2023 : V-paced rate 60s  EKG reviewed by me: AF 64 incomplete RBBB similar to priors  Data reviewed by me [facility]) 04/25/2023: ed note, admission H&P, IR note, hospitalist progress note, last 24h vitals tele labs imaging I/O    Principal Problem:   Acute on chronic combined systolic and diastolic CHF (congestive heart failure) (HCC) Active Problems:   CAD S/P percutaneous coronary angioplasty   Chronic kidney disease, stage 4 (severe) (HCC)   Hypercholesteremia   Obstructive sleep apnea   History of abdominal aortic aneurysm (AAA)   History of CVA (cerebrovascular accident)   Essential (primary) hypertension   Atrial fibrillation (HCC)   Aortic atherosclerosis (HCC)   Implantable cardioverter-defibrillator (ICD) in situ   Acute pulmonary edema  (HCC)   DOE (dyspnea on exertion)   Chronic obstructive pulmonary disease, unspecified COPD type (HCC)   Abdominal ascites    ASSESSMENT AND PLAN:  Nichloas Rametta. Ruckert is an 44yoM with a PMH of PMH of chronic HFrEF (EF 20%, moderate MR 02/17/2023), CAD s/p CABG 08/2016, hx VF arrest s/p MI in 1994 s/p dual-chamber ICD (2014) and recent battery change out 02/13/2023, paroxysmal  AF (Eliquis), CKD 4, AAA s/p EVAR (2008), COPD, DM2 who presented to [facility] ED 04/23/2023 after a fall. Cardiology is consulted for assistance with his HF.   # mechanical fall No preceding dizziness, lightheadedness, chest pain. Fortunately no acute orthopedic injury or head injury by imaging.   # acute hypoxic respiratory failure # ? PNA Possible aspiration PNA, on levaquin per primary   # anasarca # acute on chronic HFrEF # moderate MR S/p LVP with IR yielding 6.8L fluid on 5/30. Suspect dietary indiscretions with multiple chronic issues (HFrEF, CKD, and cirrhosis) contributory to repeat admissions.  - s/p IV lasix 80mg  x 1 and IV 20mg  x 1 upon presentation. Agree with continuing 60mg  IV BID for now, consider transition to po tomorrow or Sunday -Plan for torsemide 60mg  PO BID at discharge - continue coreg 6.25mg  BID, other GDMT with ARB/ARNI, SGLT2i, MRA limited by CKD + BP  - stressed importance of adherence to low sat diet + fluid restriction. <1L per day per nephrology recommendations - defer additional cardiac diagnostics - will need close follow up with advanced HF at discharge.   # CKD 4 Monitor closely with diuresis.  -Slight improvement with BUN/CR today 64/3.2 and GFR 19.   # paroxysmal AF  # hx VF arrest s/p medtronic dual-chamber ICD In rate controlled AF by admission EKG. ventricular paced rate in the 60s on telemetry this a.m.  - continue coreg and eliquis 2.5mg  BID for stroke prevention  # CAD s/p CABG  # Demand ischemia Chest pain-free.  Troponin 20, 19. C/w demand in the setting of acute on chronic  CHF and chronically elevated with renal insufficiency and not ACS. - continue aspirin 81mg  daily  - continue atorva 40mg  daily - continue imdur 15mg  daily   This patient's plan of care was discussed and created with Dr. Juliann Pares and he is in agreement.  Signed: Gale Journey , PA-C 04/25/2023, 11:34 AM [facility]

## 2023-04-26 DIAGNOSIS — J810 Acute pulmonary edema: Secondary | ICD-10-CM | POA: Diagnosis not present

## 2023-04-26 DIAGNOSIS — I5043 Acute on chronic combined systolic (congestive) and diastolic (congestive) heart failure: Secondary | ICD-10-CM | POA: Diagnosis not present

## 2023-04-26 DIAGNOSIS — N184 Chronic kidney disease, stage 4 (severe): Secondary | ICD-10-CM | POA: Diagnosis not present

## 2023-04-26 DIAGNOSIS — R188 Other ascites: Secondary | ICD-10-CM | POA: Diagnosis not present

## 2023-04-26 LAB — BASIC METABOLIC PANEL
Anion gap: 9 (ref 5–15)
BUN: 66 mg/dL — ABNORMAL HIGH (ref 8–23)
CO2: 27 mmol/L (ref 22–32)
Calcium: 8.6 mg/dL — ABNORMAL LOW (ref 8.9–10.3)
Chloride: 98 mmol/L (ref 98–111)
Creatinine, Ser: 3.24 mg/dL — ABNORMAL HIGH (ref 0.61–1.24)
GFR, Estimated: 19 mL/min — ABNORMAL LOW (ref >60)
Glucose, Bld: 121 mg/dL — ABNORMAL HIGH (ref 70–99)
Potassium: 4.2 mmol/L (ref 3.5–5.1)
Sodium: 134 mmol/L — ABNORMAL LOW (ref 135–145)

## 2023-04-26 MED ORDER — SPIRONOLACTONE 25 MG PO TABS
25.0000 mg | ORAL_TABLET | Freq: Every day | ORAL | Status: DC
  Administered 2023-04-27 – 2023-04-29 (×3): 25 mg via ORAL
  Filled 2023-04-26 (×3): qty 1

## 2023-04-26 MED ORDER — IPRATROPIUM-ALBUTEROL 0.5-2.5 (3) MG/3ML IN SOLN
3.0000 mL | RESPIRATORY_TRACT | Status: DC
  Filled 2023-04-26: qty 3

## 2023-04-26 MED ORDER — FUROSEMIDE 10 MG/ML IJ SOLN
80.0000 mg | Freq: Two times a day (BID) | INTRAMUSCULAR | Status: DC
  Administered 2023-04-27 – 2023-04-29 (×5): 80 mg via INTRAVENOUS
  Filled 2023-04-26 (×5): qty 8

## 2023-04-26 MED ORDER — IPRATROPIUM-ALBUTEROL 0.5-2.5 (3) MG/3ML IN SOLN
3.0000 mL | Freq: Four times a day (QID) | RESPIRATORY_TRACT | Status: DC
  Administered 2023-04-26 – 2023-04-27 (×3): 3 mL via RESPIRATORY_TRACT
  Filled 2023-04-26 (×3): qty 3

## 2023-04-26 NOTE — Progress Notes (Signed)
SUBJECTIVE: Patient denies any significant shortness of breath with no chest pain or palpitation.   Vitals:   04/25/23 2054 04/25/23 2357 04/26/23 0359 04/26/23 0841  BP: 102/70 105/75 110/75 109/78  Pulse: (!) 59 62 60 (!) 59  Resp: 16 (!) 24 16 18   Temp: (!) 97.4 F (36.3 C) 97.8 F (36.6 C) (!) 97.4 F (36.3 C) (!) 97.3 F (36.3 C)  TempSrc:      SpO2: 100% 96% 95% 95%  Weight:      Height:        Intake/Output Summary (Last 24 hours) at 04/26/2023 1121 Last data filed at 04/26/2023 0359 Gross per 24 hour  Intake --  Output 400 ml  Net -400 ml    LABS: Basic Metabolic Panel: Recent Labs    04/25/23 0613 04/26/23 0612  NA 136 134*  K 3.8 4.2  CL 99 98  CO2 28 27  GLUCOSE 105* 121*  BUN 64* 66*  CREATININE 3.20* 3.24*  CALCIUM 8.5* 8.6*   Liver Function Tests: Recent Labs    04/23/23 1358  AST 31  ALT 10  ALKPHOS 140*  BILITOT 2.3*  PROT 6.8  ALBUMIN 2.6*   No results for input(s): "LIPASE", "AMYLASE" in the last 72 hours. CBC: Recent Labs    04/23/23 1358 04/24/23 0456 04/25/23 0613  WBC 4.3 3.9* 5.7  NEUTROABS 2.6  --   --   HGB 11.8* 12.3* 11.9*  HCT 39.9 40.4 39.1  MCV 100.5* 99.0 98.7  PLT 110* 107* 102*   Cardiac Enzymes: No results for input(s): "CKTOTAL", "CKMB", "CKMBINDEX", "TROPONINI" in the last 72 hours. BNP: Invalid input(s): "POCBNP" D-Dimer: No results for input(s): "DDIMER" in the last 72 hours. Hemoglobin A1C: No results for input(s): "HGBA1C" in the last 72 hours. Fasting Lipid Panel: No results for input(s): "CHOL", "HDL", "LDLCALC", "TRIG", "CHOLHDL", "LDLDIRECT" in the last 72 hours. Thyroid Function Tests: No results for input(s): "TSH", "T4TOTAL", "T3FREE", "THYROIDAB" in the last 72 hours.  Invalid input(s): "FREET3" Anemia Panel: No results for input(s): "VITAMINB12", "FOLATE", "FERRITIN", "TIBC", "IRON", "RETICCTPCT" in the last 72 hours.   PHYSICAL EXAM General: Well developed, well nourished, in no acute  distress HEENT:  Normocephalic and atramatic Neck:  No JVD.  Lungs: Clear bilaterally to auscultation and percussion. Heart: HRRR . Normal S1 and S2 without gallops or murmurs.  Abdomen: Bowel sounds are positive, abdomen soft and non-tender  Msk:  Back normal, normal gait. Normal strength and tone for age. Extremities: No clubbing, cyanosis or edema.   Neuro: Alert and oriented X 3. Psych:  Good affect, responds appropriately  TELEMETRY: A-fib with controlled ventricular rate  ASSESSMENT AND PLAN: HFrEF with severe LV dysfunction left ventricular ejection fraction 20% and history of CAD presented to the hospital after mechanical fall and exacerbation of CHF.  Patient is gradually improving being less short of breath.  Atrial fibrillation has well-controlled ventricular rate.  Continue current treatment.   ICD-10-CM   1. Anasarca  R60.1     2. Abdominal distension  R14.0     3. Acute on chronic congestive heart failure, unspecified heart failure type (HCC)  I50.9     4. Fall, initial encounter  W19.XXXA     5. Traumatic injury of head, initial encounter  S09.90XA       Principal Problem:   Acute on chronic combined systolic and diastolic CHF (congestive heart failure) (HCC) Active Problems:   CAD S/P percutaneous coronary angioplasty   Chronic kidney disease, stage 4 (  severe) (HCC)   Hypercholesteremia   Obstructive sleep apnea   History of abdominal aortic aneurysm (AAA)   History of CVA (cerebrovascular accident)   Essential (primary) hypertension   Atrial fibrillation (HCC)   Aortic atherosclerosis (HCC)   Implantable cardioverter-defibrillator (ICD) in situ   Acute pulmonary edema (HCC)   DOE (dyspnea on exertion)   Chronic obstructive pulmonary disease, unspecified COPD type (HCC)   Abdominal ascites    Adrian Blackwater, MD, [facility] 04/26/2023 11:21 AM

## 2023-04-26 NOTE — Assessment & Plan Note (Addendum)
Started improving with diuresis. Nephrology consult to help with appropriate diuresis -Monitor renal function as patient is being diuresed -Avoid nephrotoxins

## 2023-04-26 NOTE — Progress Notes (Signed)
Progress Note   Patient: Taylor [name] GNF:[id number] DOB: 1941/10/15 DOA: 04/23/2023     3 DOS: the patient was seen and examined on 04/26/2023   Brief hospital course: Mr. Abishai Sunderhaus is a 82 year old male with combined heart failure, NYHA class III, SA node dysfunction, hyperlipidemia, CKD stage IV, OSA,, hepatic steatosis, history of abdominal ascites status post paracentesis, hypertension, atrial fibrillation, history of abdominal aortic aneurysm status post endovascular repair in 2008, CAD status post CABG and PCI, old left PCA territory, who presents to the emergency department for chief concerns of weight gain, shortness of breath, and falling at [employer].  Vitals in the ED showed temperature 98, respiration rate of 18, heart rate of 62, blood pressure 168/83, SpO2 of 96% on room air.  Serum sodium is 140, potassium 3.6, chloride 101, bicarb 29, BUN of 62, serum creatinine of 3.17, EGFR of 19, nonfasting blood glucose 91, WBC 4.3, hemoglobin 11.8, platelets of 110.  Alk phos was elevated at 140.  Albumin level was 2.6.  BNP was elevated 1516.7, high sensitive troponin was 20.  ED treatment: Furosemide 80 mg IV one-time dose  5/30: Vital stable.  Slight worsening of creatinine to 3.33 which remained around his baseline.  S/p paracentesis with removal of 6.8 L of amber-colored fluid. Worsening cough, procalcitonin at 0.25-started p.o. levofloxacin. Prior echocardiogram with EF of less than 20%, his cardiologist was also consulted.  Established patient with [facility] clinic. 5/31: Hemodynamically stable and clinically improving.  Cardiology would like to continue with IV diuresis.  Renal function stable. 6/1: Vital stable.  Slight worsening of creatinine and BUN today.  Holding today's Lasix. Started wheezing-added DuoNeb.   Assessment and Plan: * Acute on chronic combined systolic and diastolic CHF (congestive heart failure) (HCC) Patient had complete echo on 02/17/2023:  Severe LV and RV systolic dysfunction, estimated ejection fraction 20%, mild AR, trivial PR, moderate TR Status post furosemide 80 mg IV one-time dose per EDP Worsening renal function today. Holding Lasix and closely monitoring volume status, might need Lasix infusion for gentle diuresis.  Not much output recorded. Strict I's and O's Daily weight and BMP Cardiology consult-  Abdominal ascites Presumed secondary to acute on chronic combined heart failure exacerbation in setting of hepatic steatosis S/p ultrasound-guided paracentesis with removal of 6.5 L of -Continue with IV diuresis  Acute pulmonary edema (HCC) Presumed secondary to heart failure exacerbation, chest clear today -Continue with IV diuresis  Chronic kidney disease, stage 4 (severe) (HCC) Slight worsening of BUN/creatinine but remained mostly with than his baseline. Nephrology consult to help with appropriate diuresis -Monitor renal function as patient is being diuresed -Avoid nephrotoxins  Chronic obstructive pulmonary disease, unspecified COPD type (HCC) Worsening cough for the past couple of week, Calcitonin added 0.25 Started him on levofloxacin Home DuoNebs every 4 hours as needed for wheezing and shortness of breath resumed  Essential (primary) hypertension Home Coreg 6.25 mg p.o. twice daily with meals resumed, Imdur 50 mg daily resumed  Atrial fibrillation (HCC) Home Eliquis not resumed on admission due to recent fall with multiple skin lacerations, ecchymosis on the right arm and shoulder and ultrasound-guided paracentesis ordered on admission. -Restarting home Eliquis as hemoglobin seems stable   CAD S/P percutaneous coronary angioplasty No chest pain. -Continue with home aspirin, carvedilol and statin  Obstructive sleep apnea -CPAP at night  Hypercholesteremia Home atorvastatin 40 mg daily resumed  DOE (dyspnea on exertion) Presumed secondary to pulmonary edema in setting of combined acute on  chronic  heart failure exacerbation  Acute exacerbation of CHF (congestive heart failure) (HCC)-resolved as of 04/23/2023 Patient had complete echo on 02/17/2023 which was read as: Severe LV and RV systolic dysfunction, estimated ejection fraction is 20%.  Mild AR, trivial PR, moderate TR. Strict I's and O's Status post furosemide 80 mg IV one-time dose per EDP I have ordered furosemide 60 mg IV twice daily for 5/30/104 Admit to telemetry cardiac, inpatient    Subjective: Patient was seen and examined today.  Having some wheeze, stating that since he was diagnosed with leaky valve he does get intermittent wheezing.  Physical Exam: Vitals:   04/26/23 0359 04/26/23 0841 04/26/23 1143 04/26/23 1300  BP: 110/75 109/78  106/75  Pulse: 60 (!) 59  61  Resp: 16 18  18   Temp: (!) 97.4 F (36.3 C) (!) 97.3 F (36.3 C)  (!) 97.5 F (36.4 C)  TempSrc:      SpO2: 95% 95% 96% 98%  Weight:      Height:       General.  Frail elderly man, in no acute distress. Pulmonary.  Scattered wheeze bilaterally, normal respiratory effort. CV.  Regular rate and rhythm, no JVD, rub or murmur. Abdomen.  Soft, nontender, nondistended, BS positive. CNS.  Alert and oriented .  No focal neurologic deficit. Extremities.  1+ LE edema, no cyanosis, pulses intact and symmetrical. Psychiatry.  Judgment and insight appears normal.   Data Reviewed: Prior data reviewed  Family Communication: Discussed with wife on phone  Disposition: Status is: Inpatient Remains inpatient appropriate because: Severity of illness  Planned Discharge Destination: Home  DVT prophylaxis.  Eliquis Time spent: 44 minutes  This record has been created using Conservation officer, historic buildings. Errors have been sought and corrected,but may not always be located. Such creation errors do not reflect on the standard of care.   Author: Arnetha Courser, MD 04/26/2023 2:28 PM  For on call review [email or web address].

## 2023-04-26 NOTE — Progress Notes (Signed)
[facility]  ROUNDING NOTE   Subjective:   [name] was admitted to [facility] on 04/23/2023 for Anasarca [R60.1] Abdominal distension [R14.0] Acute exacerbation of CHF (congestive heart failure) (HCC) [I50.9] Fall, initial encounter [W19.XXXA] Traumatic injury of head, initial encounter [S09.90XA] Acute on chronic congestive heart failure, unspecified heart failure type (HCC) [I50.9]  Patient fell when at [employer]. Now with acute exacerbation of CHF with ascites and pulmonary edema.     Objective:  Vital signs in last 24 hours:  Temp:  [97.3 F (36.3 C)-97.8 F (36.6 C)] 97.5 F (36.4 C) (06/01 1300) Pulse Rate:  [58-62] 61 (06/01 1300) Resp:  [16-24] 18 (06/01 1300) BP: (98-110)/(70-83) 106/75 (06/01 1300) SpO2:  [95 %-100 %] 98 % (06/01 1300)  Weight change:  Filed Weights   04/23/23 1234  Weight: 88.5 kg    Intake/Output: I/O last 3 completed shifts: In: -  Out: 400 [Urine:400]   Intake/Output this shift:  Total I/O In: 240 [P.O.:240] Out: 425 [Urine:425]  Physical Exam: General: NAD,   Head: Normocephalic, atraumatic. Moist oral mucosal membranes  Eyes: Anicteric, PERRL  Neck: Supple, trachea midline  Lungs:  Clear to auscultation  Heart: irregular  Abdomen:  Soft, nontender,   Extremities:  + peripheral edema.  Neurologic: Nonfocal, moving all four extremities  Skin: No lesions        Basic Metabolic Panel: Recent Labs  Lab 04/23/23 1358 04/24/23 0456 04/25/23 0613 04/26/23 0612  NA 140 139 136 134*  K 3.6 3.7 3.8 4.2  CL 101 100 99 98  CO2 29 30 28 27   GLUCOSE 91 104* 105* 121*  BUN 62* 61* 64* 66*  CREATININE 3.17* 3.33* 3.20* 3.24*  CALCIUM 8.6* 8.8* 8.5* 8.6*    Liver Function Tests: Recent Labs  Lab 04/23/23 1358  AST 31  ALT 10  ALKPHOS 140*  BILITOT 2.3*  PROT 6.8  ALBUMIN 2.6*   No results for input(s): "LIPASE", "AMYLASE" in the last 168 hours. No results for input(s): "AMMONIA" in the last  168 hours.  CBC: Recent Labs  Lab 04/23/23 1358 04/24/23 0456 04/25/23 0613  WBC 4.3 3.9* 5.7  NEUTROABS 2.6  --   --   HGB 11.8* 12.3* 11.9*  HCT 39.9 40.4 39.1  MCV 100.5* 99.0 98.7  PLT 110* 107* 102*    Cardiac Enzymes: No results for input(s): "CKTOTAL", "CKMB", "CKMBINDEX", "TROPONINI" in the last 168 hours.  BNP: Invalid input(s): "POCBNP"  CBG: No results for input(s): "GLUCAP" in the last 168 hours.  Microbiology: Results for orders placed or performed during the hospital encounter of 04/03/23  SARS Coronavirus 2 by RT PCR (hospital order, performed in [facility] hospital lab) *cepheid single result test* Anterior Nasal Swab     Status: None   Collection Time: 04/03/23 11:03 AM   Specimen: Anterior Nasal Swab  Result Value Ref Range Status   SARS Coronavirus 2 by RT PCR NEGATIVE NEGATIVE Final    Comment: (NOTE) SARS-CoV-2 target nucleic acids are NOT DETECTED.  The SARS-CoV-2 RNA is generally detectable in upper and lower respiratory specimens during the acute phase of infection. The lowest concentration of SARS-CoV-2 viral copies this assay can detect is 250 copies / mL. A negative result does not preclude SARS-CoV-2 infection and should not be used as the sole basis for treatment or other patient management decisions.  A negative result may occur with improper specimen collection / handling, submission of specimen other than nasopharyngeal swab, presence of  viral mutation(s) within the areas targeted by this assay, and inadequate number of viral copies (<250 copies / mL). A negative result must be combined with clinical observations, patient history, and epidemiological information.  Fact Sheet for Patients:   RoadLapTop.co.za  Fact Sheet for Healthcare Providers: http://kim-miller.com/  This test is not yet approved or  cleared by the [place] FDA and has been authorized for detection and/or diagnosis  of SARS-CoV-2 by FDA under an Emergency Use Authorization (EUA).  This EUA will remain in effect (meaning this test can be used) for the duration of the COVID-19 declaration under Section 564(b)(1) of the Act, 21 U.S.C. section 360bbb-3(b)(1), unless the authorization is terminated or revoked sooner.  Performed at [facility], [address] [place], [place] [address]   Blood culture (routine x 2)     Status: None   Collection Time: 04/03/23 12:11 PM   Specimen: BLOOD  Result Value Ref Range Status   Specimen Description BLOOD BLOOD RIGHT ARM  Final   Special Requests   Final    BOTTLES DRAWN AEROBIC AND ANAEROBIC Blood Culture results may not be optimal due to an inadequate volume of blood received in culture bottles   Culture   Final    NO GROWTH 5 DAYS Performed at [facility], [address] [place], [place] [address]    Report Status 04/08/2023 FINAL  Final  Blood culture (routine x 2)     Status: None   Collection Time: 04/03/23 12:16 PM   Specimen: BLOOD  Result Value Ref Range Status   Specimen Description BLOOD BLOOD LEFT HAND  Final   Special Requests   Final    BOTTLES DRAWN AEROBIC AND ANAEROBIC Blood Culture results may not be optimal due to an inadequate volume of blood received in culture bottles   Culture   Final    NO GROWTH 5 DAYS Performed at [facility], [address], [place], [place] [address]    Report Status 04/08/2023 FINAL  Final  Body fluid culture w Gram Stain     Status: None   Collection Time: 04/04/23 10:11 AM   Specimen: PATH Cytology Peritoneal fluid  Result Value Ref Range Status   Specimen Description   Final    PERITONEAL Performed at [facility], [address] [place], [place] [address]    Special Requests   Final    NONE Performed at [facility], [address] [place], [place] [address]    Gram Stain   Final    RARE WBC PRESENT, PREDOMINANTLY MONONUCLEAR NO ORGANISMS  SEEN    Culture   Final    NO GROWTH 3 DAYS Performed at [facility], [address] [place], [place] [address]    Report Status 04/08/2023 FINAL  Final    Coagulation Studies: No results for input(s): "LABPROT", "INR" in the last 72 hours.  Urinalysis: No results for input(s): "COLORURINE", "LABSPEC", "PHURINE", "GLUCOSEU", "HGBUR", "BILIRUBINUR", "KETONESUR", "PROTEINUR", "UROBILINOGEN", "NITRITE", "LEUKOCYTESUR" in the last 72 hours.  Invalid input(s): "APPERANCEUR"    Imaging: No results found.   Medications:    albumin human      allopurinol  150 mg Oral Daily   apixaban  2.5 mg Oral BID   aspirin EC  81 mg Oral Daily   atorvastatin  40 mg Oral Daily   carvedilol  6.25 mg Oral BID WC   dextromethorphan-guaiFENesin  1 tablet Oral BID   ipratropium-albuterol  3 mL Nebulization Q6H  isosorbide mononitrate  15 mg Oral Daily   levofloxacin  500 mg Oral Q48H   pantoprazole  40 mg Oral Daily   potassium chloride SA  20 mEq Oral Daily   acetaminophen **OR** acetaminophen, albumin human, HYDROcodone-acetaminophen, ipratropium-albuterol, ondansetron **OR** ondansetron (ZOFRAN) IV, senna-docusate  Assessment/ Plan:  [name] is a 82 y.o.  male with peripheral vascular disease, diabetes mellitus type II, hypertension, coronary artery disease status post CABG, hyperlipidemia, AICD, chronic congestive heart failure and gout who is admitted to [facility] on[facility]29[facility]2024 for Anasarca [R60.1] Abdominal distension [R14.0] Acute exacerbation of CHF (congestive heart failure) (HCC) [I50.9] Fall, initial encounter [W19.XXXA] Traumatic injury of head, initial encounter [S09.90XA] Acute on chronic congestive heart failure, unspecified heart failure type (HCC) [I50.9]   Acute Kidney Injuyry on chronic kidney disease stage IV: baseline creatinine of 2.93, GFR of 21 on 5/32/24.  - holding empagliflozin  Hypertension and acute exacerbation of chronic systolic and diastolic  congestive heart failure. Status post IV furosemide and IV albumin.  - start spironolactone tomorrow morning.  - start furosemide IV tomorrow morning  Secondary Hyperparathyroidism: not currently on activated vitamin D.   Diabetes mellitus type II with chronic kidney disease:  noninsulin dependent. Hemoglobin A1c of 6.5% on 12/24/22. Holding empagliflozin.    LOS: 3 Philemon Riedesel 6/1/20244:48 PM

## 2023-04-26 NOTE — TOC Initial Note (Signed)
Transition of Care [facility]) - Initial/Assessment Note    Patient Details  Name: Taylor [name] MRN: [id number] Date of Birth: [birth year]  Transition of Care [facility]) CM/SW Contact:    Kemper Durie, RN Phone Number: 04/26/2023, 3:28 PM  Clinical Narrative:                  Patient admitted from home, lives with wife.  Dr. Sherrie Mustache is PCP, has home oxygen.  Spoke with patient at the bedside, plan is to return home and continue with cardiac rehab services.  Wife will provide transportation home with medically ready for discharge.   Expected Discharge Plan: Home/Self Care Barriers to Discharge: Continued Medical Work up   Patient Goals and CMS Choice Patient states their goals for this hospitalization and ongoing recovery are:: Home, continue cardiac rehab          Expected Discharge Plan and Services       Living arrangements for the past 2 months: Single Family Home                                      Prior Living Arrangements/Services Living arrangements for the past 2 months: Single Family Home Lives with:: Spouse Patient language and need for interpreter reviewed:: Yes        Need for Family Participation in Patient Care: Yes (Comment) Care giver support system in place?: Yes (comment) Current home services: DME Criminal Activity/Legal Involvement Pertinent to Current Situation/Hospitalization: No - Comment as needed  Activities of Daily Living Home Assistive Devices/Equipment: Eyeglasses ADL Screening (condition at time of admission) Patient's cognitive ability adequate to safely complete daily activities?: Yes Is the patient deaf or have difficulty hearing?: No Does the patient have difficulty seeing, even when wearing glasses/contacts?: No Does the patient have difficulty concentrating, remembering, or making decisions?: No Patient able to express need for assistance with ADLs?: Yes Does the patient have difficulty dressing or bathing?: No Independently  performs ADLs?: Yes (appropriate for developmental age) Does the patient have difficulty walking or climbing stairs?: No Weakness of Legs: None Weakness of Arms/Hands: None  Permission Sought/Granted                  Emotional Assessment Appearance:: Appears stated age Attitude/Demeanor/Rapport: Engaged Affect (typically observed): Calm Orientation: : Oriented to Self, Oriented to Place, Oriented to  Time, Oriented to Situation   Psych Involvement: No (comment)  Admission diagnosis:  Anasarca [R60.1] Abdominal distension [R14.0] Acute exacerbation of CHF (congestive heart failure) (HCC) [I50.9] Fall, initial encounter [W19.XXXA] Traumatic injury of head, initial encounter [S09.90XA] Acute on chronic congestive heart failure, unspecified heart failure type [facility]) [I50.9] Patient Active Problem List   Diagnosis Date Noted   Abdominal ascites 02/25/2023   Hyperbilirubinemia 02/25/2023   Chronic obstructive pulmonary disease, unspecified COPD type (HCC) 01/03/2023   Severe mitral regurgitation 10/31/2022   HFrEF (heart failure with reduced ejection fraction) (HCC) 10/31/2022   Weakness 10/30/2022   Acute pulmonary edema (HCC) 10/30/2022   DOE (dyspnea on exertion) 10/30/2022   Hypoxia 10/30/2022   Abdominal aortic aneurysm (AAA) (HCC) 10/30/2022   Dyspnea 10/30/2022   Orthopnea 10/15/2022   Acute on chronic combined systolic and diastolic CHF (congestive heart failure) (HCC) 10/15/2022   Carotid stenosis, symptomatic w/o infarct, left 07/05/2022   Atrophy of kidney 09/27/2021   Atherosclerotic heart disease of native coronary artery with unspecified angina pectoris (HCC) 08/22/2021  Implantable cardioverter-defibrillator (ICD) in situ 08/22/2021   Hypertension 08/22/2021   Idiopathic gout, unspecified site 08/22/2021   Pure hypercholesterolemia, unspecified 08/22/2021   Aortic atherosclerosis (HCC) 05/08/2021   Type 2 diabetes mellitus with diabetic chronic kidney  disease (HCC) 11/21/2020   Hypomagnesemia 04/26/2020   Numbness and tingling in both hands 04/19/2020   Atrial fibrillation (HCC) 04/19/2020   Other specified interstitial pulmonary diseases (HCC) 12/06/2019   Abdominal pain    Hyperuricemia 03/31/2019   Numbness 05/07/2018   Benign colonic polyp 03/24/2017   Tibialis anterior tenosynovitis 12/21/2015   Pleural nodule 07/27/2015   Restrictive lung disease 05/12/2015   Barrett esophagus 03/30/2015   Ache in joint 03/30/2015   Bradycardia 03/30/2015   CAD S/P percutaneous coronary angioplasty 03/30/2015   Cardiac defibrillator in place 03/30/2015   Chronic kidney disease, stage 4 (severe) (HCC) 03/30/2015   Cluster headache syndrome 03/30/2015   Degeneration of lumbar or lumbosacral intervertebral disc 03/30/2015   Gout 03/30/2015   Personal history of malignant neoplasm of bladder 03/30/2015   Hypercholesteremia 03/30/2015   LBP (low back pain) 03/30/2015   Personal history of malignant melanoma of skin 03/30/2015   Arthritis of knee, degenerative 03/30/2015   Obstructive sleep apnea 03/30/2015   Chronic venous insufficiency 03/30/2015   Psoriatic arthritis (HCC) 03/30/2015   History of abdominal aortic aneurysm (AAA) 06/24/2013   History of CVA (cerebrovascular accident) 06/24/2013   Cardiomyopathy, ischemic 06/24/2013   Essential (primary) hypertension 08/17/2011   Arrhythmia, sinus node 08/17/2011   History of ventricular fibrillation 08/17/2011   Systolic CHF with reduced left ventricular function, NYHA class 3 (HCC) 02/26/2010   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 04/27/2007   Cardiac pacemaker in situ 10/10/2006   PCP:  Malva Limes, MD Pharmacy:   [facility] [phone] Cheree Ditto, [place] - [address] AT [facility] OF [address] [place] [address] Phone: [phone] Fax: [phone]     Social Determinants of Health (SDOH) Social History: SDOH Screenings    Food Insecurity: No Food Insecurity (04/24/2023)  Housing: Low Risk  (04/24/2023)  Transportation Needs: No Transportation Needs (04/24/2023)  Utilities: Not At Risk (04/24/2023)  Alcohol Screen: Low Risk  (01/03/2023)  Depression (PHQ2-9): High Risk (04/10/2023)  Financial Resource Strain: Low Risk  (05/07/2022)  Physical Activity: Insufficiently Active (04/18/2021)  Social Connections: Moderately Isolated (05/07/2022)  Stress: No Stress Concern Present (05/07/2022)  Tobacco Use: Medium Risk (04/24/2023)   SDOH Interventions:     Readmission Risk Interventions     No data to display

## 2023-04-27 DIAGNOSIS — J810 Acute pulmonary edema: Secondary | ICD-10-CM | POA: Diagnosis not present

## 2023-04-27 DIAGNOSIS — N184 Chronic kidney disease, stage 4 (severe): Secondary | ICD-10-CM | POA: Diagnosis not present

## 2023-04-27 DIAGNOSIS — R188 Other ascites: Secondary | ICD-10-CM | POA: Diagnosis not present

## 2023-04-27 DIAGNOSIS — I5043 Acute on chronic combined systolic (congestive) and diastolic (congestive) heart failure: Secondary | ICD-10-CM | POA: Diagnosis not present

## 2023-04-27 LAB — BASIC METABOLIC PANEL
Anion gap: 8 (ref 5–15)
BUN: 69 mg/dL — ABNORMAL HIGH (ref 8–23)
CO2: 27 mmol/L (ref 22–32)
Calcium: 8.6 mg/dL — ABNORMAL LOW (ref 8.9–10.3)
Chloride: 99 mmol/L (ref 98–111)
Creatinine, Ser: 3.09 mg/dL — ABNORMAL HIGH (ref 0.61–1.24)
GFR, Estimated: 20 mL/min — ABNORMAL LOW (ref >60)
Glucose, Bld: 113 mg/dL — ABNORMAL HIGH (ref 70–99)
Potassium: 4 mmol/L (ref 3.5–5.1)
Sodium: 134 mmol/L — ABNORMAL LOW (ref 135–145)

## 2023-04-27 NOTE — Progress Notes (Signed)
[facility]  ROUNDING NOTE   Subjective:   [name] was admitted to [facility] on 04/23/2023 for Anasarca [R60.1] Abdominal distension [R14.0] Acute exacerbation of CHF (congestive heart failure) (HCC) [I50.9] Fall, initial encounter [W19.XXXA] Traumatic injury of head, initial encounter [S09.90XA] Acute on chronic congestive heart failure, unspecified heart failure type (HCC) [I50.9]  UOP 600 - recorded  Patient states he is breathing about the same.   No family at bedside this morning.   Creatinine 3.09 (3.24)  Objective:  Vital signs in last 24 hours:  Temp:  [97.4 F (36.3 C)-97.8 F (36.6 C)] 97.4 F (36.3 C) (06/02 1156) Pulse Rate:  [59-67] 60 (06/02 1156) Resp:  [16-19] 16 (06/02 1156) BP: (99-119)/(64-83) 117/78 (06/02 1156) SpO2:  [92 %-100 %] 100 % (06/02 1156)  Weight change:  Filed Weights   04/23/23 1234  Weight: 88.5 kg    Intake/Output: I/O last 3 completed shifts: In: 240 [P.O.:240] Out: 825 [Urine:825]   Intake/Output this shift:  Total I/O In: -  Out: 600 [Urine:600]  Physical Exam: General: NAD,   Head: Normocephalic, atraumatic. Moist oral mucosal membranes  Eyes: Anicteric, PERRL  Neck: Supple, trachea midline  Lungs:  Bilateral rales , diminished lung sounds  Heart: irregular  Abdomen:  Soft, nontender,   Extremities:  + peripheral edema.  Neurologic: Nonfocal, moving all four extremities  Skin: No lesions        Basic Metabolic Panel: Recent Labs  Lab 04/23/23 1358 04/24/23 0456 04/25/23 0613 04/26/23 0612 04/27/23 0551  NA 140 139 136 134* 134*  K 3.6 3.7 3.8 4.2 4.0  CL 101 100 99 98 99  CO2 29 30 28 27 27   GLUCOSE 91 104* 105* 121* 113*  BUN 62* 61* 64* 66* 69*  CREATININE 3.17* 3.33* 3.20* 3.24* 3.09*  CALCIUM 8.6* 8.8* 8.5* 8.6* 8.6*     Liver Function Tests: Recent Labs  Lab 04/23/23 1358  AST 31  ALT 10  ALKPHOS 140*  BILITOT 2.3*  PROT 6.8  ALBUMIN 2.6*    No results for  input(s): "LIPASE", "AMYLASE" in the last 168 hours. No results for input(s): "AMMONIA" in the last 168 hours.  CBC: Recent Labs  Lab 04/23/23 1358 04/24/23 0456 04/25/23 0613  WBC 4.3 3.9* 5.7  NEUTROABS 2.6  --   --   HGB 11.8* 12.3* 11.9*  HCT 39.9 40.4 39.1  MCV 100.5* 99.0 98.7  PLT 110* 107* 102*     Cardiac Enzymes: No results for input(s): "CKTOTAL", "CKMB", "CKMBINDEX", "TROPONINI" in the last 168 hours.  BNP: Invalid input(s): "POCBNP"  CBG: No results for input(s): "GLUCAP" in the last 168 hours.  Microbiology: Results for orders placed or performed during the hospital encounter of 04/03/23  SARS Coronavirus 2 by RT PCR (hospital order, performed in [facility] hospital lab) *cepheid single result test* Anterior Nasal Swab     Status: None   Collection Time: 04/03/23 11:03 AM   Specimen: Anterior Nasal Swab  Result Value Ref Range Status   SARS Coronavirus 2 by RT PCR NEGATIVE NEGATIVE Final    Comment: (NOTE) SARS-CoV-2 target nucleic acids are NOT DETECTED.  The SARS-CoV-2 RNA is generally detectable in upper and lower respiratory specimens during the acute phase of infection. The lowest concentration of SARS-CoV-2 viral copies this assay can detect is 250 copies / mL. A negative result does not preclude SARS-CoV-2 infection and should not be used as the sole basis for treatment or other patient management decisions.  A negative result may occur with improper specimen collection / handling, submission of specimen other than nasopharyngeal swab, presence of viral mutation(s) within the areas targeted by this assay, and inadequate number of viral copies (<250 copies / mL). A negative result must be combined with clinical observations, patient history, and epidemiological information.  Fact Sheet for Patients:   RoadLapTop.co.za  Fact Sheet for Healthcare Providers: http://kim-miller.com/  This test is not  yet approved or  cleared by the [place] FDA and has been authorized for detection and/or diagnosis of SARS-CoV-2 by FDA under an Emergency Use Authorization (EUA).  This EUA will remain in effect (meaning this test can be used) for the duration of the COVID-19 declaration under Section 564(b)(1) of the Act, 21 U.S.C. section 360bbb-3(b)(1), unless the authorization is terminated or revoked sooner.  Performed at [facility], [address] [place], [place] [address]   Blood culture (routine x 2)     Status: None   Collection Time: 04/03/23 12:11 PM   Specimen: BLOOD  Result Value Ref Range Status   Specimen Description BLOOD BLOOD RIGHT ARM  Final   Special Requests   Final    BOTTLES DRAWN AEROBIC AND ANAEROBIC Blood Culture results may not be optimal due to an inadequate volume of blood received in culture bottles   Culture   Final    NO GROWTH 5 DAYS Performed at [facility], [address] [place], [place] [address]    Report Status 04/08/2023 FINAL  Final  Blood culture (routine x 2)     Status: None   Collection Time: 04/03/23 12:16 PM   Specimen: BLOOD  Result Value Ref Range Status   Specimen Description BLOOD BLOOD LEFT HAND  Final   Special Requests   Final    BOTTLES DRAWN AEROBIC AND ANAEROBIC Blood Culture results may not be optimal due to an inadequate volume of blood received in culture bottles   Culture   Final    NO GROWTH 5 DAYS Performed at [facility], [address], [place], [place] [address]    Report Status 04/08/2023 FINAL  Final  Body fluid culture w Gram Stain     Status: None   Collection Time: 04/04/23 10:11 AM   Specimen: PATH Cytology Peritoneal fluid  Result Value Ref Range Status   Specimen Description   Final    PERITONEAL Performed at [facility], [address] [place], [place] [address]    Special Requests   Final    NONE Performed at [facility], [address]  [place], [place] [address]    Gram Stain   Final    RARE WBC PRESENT, PREDOMINANTLY MONONUCLEAR NO ORGANISMS SEEN    Culture   Final    NO GROWTH 3 DAYS Performed at [facility], [address] [place], [place] [address]    Report Status 04/08/2023 FINAL  Final    Coagulation Studies: No results for input(s): "LABPROT", "INR" in the last 72 hours.  Urinalysis: No results for input(s): "COLORURINE", "LABSPEC", "PHURINE", "GLUCOSEU", "HGBUR", "BILIRUBINUR", "KETONESUR", "PROTEINUR", "UROBILINOGEN", "NITRITE", "LEUKOCYTESUR" in the last 72 hours.  Invalid input(s): "APPERANCEUR"    Imaging: No results found.   Medications:    albumin human      allopurinol  150 mg Oral Daily   apixaban  2.5 mg Oral BID   aspirin EC  81 mg Oral Daily   atorvastatin  40 mg Oral Daily   carvedilol  6.25  mg Oral BID WC   dextromethorphan-guaiFENesin  1 tablet Oral BID   furosemide  80 mg Intravenous BID   isosorbide mononitrate  15 mg Oral Daily   levofloxacin  500 mg Oral Q48H   pantoprazole  40 mg Oral Daily   potassium chloride SA  20 mEq Oral Daily   spironolactone  25 mg Oral Daily   acetaminophen **OR** acetaminophen, albumin human, HYDROcodone-acetaminophen, ipratropium-albuterol, ondansetron **OR** ondansetron (ZOFRAN) IV, senna-docusate  Assessment/ Plan:  [name] is a 82 y.o.  male with peripheral vascular disease, diabetes mellitus type II, hypertension, coronary artery disease status post CABG, hyperlipidemia, AICD, chronic congestive heart failure and gout who is admitted to [facility] on5/29/2024 for Anasarca [R60.1] Abdominal distension [R14.0] Acute exacerbation of CHF (congestive heart failure) (HCC) [I50.9] Fall, initial encounter [W19.XXXA] Traumatic injury of head, initial encounter [S09.90XA] Acute on chronic congestive heart failure, unspecified heart failure type (HCC) [I50.9]   Acute Kidney Injuyry on chronic kidney disease stage IV: baseline creatinine of  2.93, GFR of 21 on 5/32/24.  - holding empagliflozin - not on RAAS inhibition  Hypertension and acute exacerbation of chronic systolic and diastolic congestive heart failure. Status post IV furosemide and IV albumin.  - Continue spironolactone PO - Continue furosemide IV  - fluid restriction  Secondary Hyperparathyroidism: not currently on activated vitamin D.   Diabetes mellitus type II with chronic kidney disease:  noninsulin dependent. Hemoglobin A1c of 6.5% on 12/24/22. - Holding empagliflozin.    LOS: 4 Sabine Tenenbaum 6/2/20242:30 PM

## 2023-04-27 NOTE — Progress Notes (Signed)
SUBJECTIVE: Patient denies any chest pain or shortness of breath   Vitals:   04/27/23 0248 04/27/23 0307 04/27/23 0715 04/27/23 0818  BP:  119/83  116/76  Pulse:  (!) 59  67  Resp:  18  16  Temp:  97.6 F (36.4 C)  97.6 F (36.4 C)  TempSrc:      SpO2: 96% 98% 92% 99%  Weight:      Height:        Intake/Output Summary (Last 24 hours) at 04/27/2023 1043 Last data filed at 04/27/2023 0901 Gross per 24 hour  Intake --  Output 1025 ml  Net -1025 ml    LABS: Basic Metabolic Panel: Recent Labs    04/26/23 0612 04/27/23 0551  NA 134* 134*  K 4.2 4.0  CL 98 99  CO2 27 27  GLUCOSE 121* 113*  BUN 66* 69*  CREATININE 3.24* 3.09*  CALCIUM 8.6* 8.6*   Liver Function Tests: No results for input(s): "AST", "ALT", "ALKPHOS", "BILITOT", "PROT", "ALBUMIN" in the last 72 hours. No results for input(s): "LIPASE", "AMYLASE" in the last 72 hours. CBC: Recent Labs    04/25/23 0613  WBC 5.7  HGB 11.9*  HCT 39.1  MCV 98.7  PLT 102*   Cardiac Enzymes: No results for input(s): "CKTOTAL", "CKMB", "CKMBINDEX", "TROPONINI" in the last 72 hours. BNP: Invalid input(s): "POCBNP" D-Dimer: No results for input(s): "DDIMER" in the last 72 hours. Hemoglobin A1C: No results for input(s): "HGBA1C" in the last 72 hours. Fasting Lipid Panel: No results for input(s): "CHOL", "HDL", "LDLCALC", "TRIG", "CHOLHDL", "LDLDIRECT" in the last 72 hours. Thyroid Function Tests: No results for input(s): "TSH", "T4TOTAL", "T3FREE", "THYROIDAB" in the last 72 hours.  Invalid input(s): "FREET3" Anemia Panel: No results for input(s): "VITAMINB12", "FOLATE", "FERRITIN", "TIBC", "IRON", "RETICCTPCT" in the last 72 hours.   PHYSICAL EXAM General: Well developed, well nourished, in no acute distress HEENT:  Normocephalic and atramatic Neck:  No JVD.  Lungs: Clear bilaterally to auscultation and percussion. Heart: HRRR . Normal S1 and S2 without gallops or murmurs.  Abdomen: Bowel sounds are positive,  abdomen soft and non-tender  Msk:  Back normal, normal gait. Normal strength and tone for age. Extremities: No clubbing, cyanosis or edema.   Neuro: Alert and oriented X 3. Psych:  Good affect, responds appropriately  TELEMETRY: A-fib with controlled ventricular rate  ASSESSMENT AND PLAN: HFrEF with severe LV dysfunction left ventricular ejection fraction 20% and history of CAD presented to the hospital after mechanical fall and exacerbation of CHF.  Patient is gradually improving being less short of breath.  Atrial fibrillation has well-controlled ventricular rate.  Continue current treatment    ICD-10-CM   1. Anasarca  R60.1     2. Abdominal distension  R14.0     3. Acute on chronic congestive heart failure, unspecified heart failure type (HCC)  I50.9     4. Fall, initial encounter  W19.XXXA     5. Traumatic injury of head, initial encounter  S09.90XA       Principal Problem:   Acute on chronic combined systolic and diastolic CHF (congestive heart failure) (HCC) Active Problems:   CAD S/P percutaneous coronary angioplasty   Chronic kidney disease, stage 4 (severe) (HCC)   Hypercholesteremia   Obstructive sleep apnea   History of abdominal aortic aneurysm (AAA)   History of CVA (cerebrovascular accident)   Essential (primary) hypertension   Atrial fibrillation (HCC)   Aortic atherosclerosis (HCC)   Implantable cardioverter-defibrillator (ICD) in situ  Acute pulmonary edema (HCC)   DOE (dyspnea on exertion)   Chronic obstructive pulmonary disease, unspecified COPD type (HCC)   Abdominal ascites    Adrian Blackwater, MD, [facility] 04/27/2023 10:43 AM

## 2023-04-27 NOTE — Progress Notes (Signed)
Progress Note   Patient: Taylor [name]:[id number] DOB: [birth year] DOA: 04/23/2023     4 DOS: the patient was seen and examined on 04/27/2023   Brief hospital course: [name] is a 82 year old male with combined heart failure, NYHA class III, SA node dysfunction, hyperlipidemia, CKD stage IV, OSA,, hepatic steatosis, history of abdominal ascites status post paracentesis, hypertension, atrial fibrillation, history of abdominal aortic aneurysm status post endovascular repair in 2008, CAD status post CABG and PCI, old left PCA territory, who presents to the emergency department for chief concerns of weight gain, shortness of breath, and falling at [facility] [employer].  Vitals in the ED showed temperature 98, respiration rate of 18, heart rate of 62, blood pressure 168/83, SpO2 of 96% on room air.  Serum sodium is 140, potassium 3.6, chloride 101, bicarb 29, BUN of 62, serum creatinine of 3.17, EGFR of 19, nonfasting blood glucose 91, WBC 4.3, hemoglobin 11.8, platelets of 110.  Alk phos was elevated at 140.  Albumin level was 2.6.  BNP was elevated 1516.7, high sensitive troponin was 20.  ED treatment: Furosemide 80 mg IV one-time dose  5/30: Vital stable.  Slight worsening of creatinine to 3.33 which remained around his baseline.  S/p paracentesis with removal of 6.8 L of amber-colored fluid. Worsening cough, procalcitonin at 0.25-started p.o. levofloxacin. Prior echocardiogram with EF of less than 20%, his cardiologist was also consulted.  Established patient with [facility] clinic. 5/31: Hemodynamically stable and clinically improving.  Cardiology would like to continue with IV diuresis.  Renal function stable. 6/1: Vital stable.  Slight worsening of creatinine and BUN today.  Holding today's Lasix. Started wheezing-added DuoNeb. 6/2: Hemodynamically stable.  Creatinine with a small improvement to 3.09, nephrology is planning to restart Lasix at 80 mg twice daily along with  spironolactone.  Appreciate nephrology help with diuresis.   Assessment and Plan: * Acute on chronic combined systolic and diastolic CHF (congestive heart failure) (HCC) Patient had complete echo on 02/17/2023: Severe LV and RV systolic dysfunction, estimated ejection fraction 20%, mild AR, trivial PR, moderate TR Status post furosemide 80 mg IV one-time dose per EDP Slight improvement in GFR to 20 today, from 19.  UOP recorded is 425 -Nephrology restarting Lasix at 80 mg twice daily, also added spironolactone Strict I's and O's Daily weight and BMP Cardiology consult-  Abdominal ascites Presumed secondary to acute on chronic combined heart failure exacerbation in setting of hepatic steatosis S/p ultrasound-guided paracentesis with removal of 6.5 L of -Continue with IV diuresis  Acute pulmonary edema (HCC) Presumed secondary to heart failure exacerbation,  -Continue with IV diuresis  Chronic kidney disease, stage 4 (severe) (HCC) Slight worsening of BUN/creatinine but remained mostly with than his baseline. Nephrology consult to help with appropriate diuresis -Monitor renal function as patient is being diuresed -Avoid nephrotoxins  Chronic obstructive pulmonary disease, unspecified COPD type (HCC) Worsening cough for the past couple of week, Calcitonin added 0.25 Started him on levofloxacin Home DuoNebs every 4 hours as needed for wheezing and shortness of breath resumed  Essential (primary) hypertension Home Coreg 6.25 mg p.o. twice daily with meals resumed, Imdur 50 mg daily resumed  Atrial fibrillation (HCC) Home Eliquis not resumed on admission due to recent fall with multiple skin lacerations, ecchymosis on the right arm and shoulder and ultrasound-guided paracentesis ordered on admission. -Restarting home Eliquis as hemoglobin seems stable   CAD S/P percutaneous coronary angioplasty No chest pain. -Continue with home aspirin, carvedilol and statin  Obstructive  sleep  apnea -CPAP at night  Hypercholesteremia Home atorvastatin 40 mg daily resumed  DOE (dyspnea on exertion) Presumed secondary to pulmonary edema in setting of combined acute on chronic heart failure exacerbation  Acute exacerbation of CHF (congestive heart failure) (HCC)-resolved as of 04/23/2023 Patient had complete echo on 02/17/2023 which was read as: Severe LV and RV systolic dysfunction, estimated ejection fraction is 20%.  Mild AR, trivial PR, moderate TR. Strict I's and O's Status post furosemide 80 mg IV one-time dose per EDP I have ordered furosemide 60 mg IV twice daily for 5/30/104 Admit to telemetry cardiac, inpatient    Subjective: Patient was sitting comfortably in chair when seen today.  On room air.  Wants to go home but agrees to stay for more diuresis.  Physical Exam: Vitals:   04/27/23 0307 04/27/23 0715 04/27/23 0818 04/27/23 1156  BP: 119/83  116/76 117/78  Pulse: (!) 59  67 60  Resp: 18  16 16   Temp: 97.6 F (36.4 C)  97.6 F (36.4 C) (!) 97.4 F (36.3 C)  TempSrc:      SpO2: 98% 92% 99% 100%  Weight:      Height:       General.  Frail elderly man, in no acute distress. Pulmonary.  Few scattered crackles bilaterally, normal respiratory effort. CV.  Regular rate and rhythm, no JVD, rub or murmur. Abdomen.  Soft, nontender, nondistended, BS positive. CNS.  Alert and oriented .  No focal neurologic deficit. Extremities.  2+ LE edema, no cyanosis, pulses intact and symmetrical.  Legs are still very tight Psychiatry.  Judgment and insight appears normal. .   Data Reviewed: Prior data reviewed  Family Communication: A friend at bedside  Disposition: Status is: Inpatient Remains inpatient appropriate because: Severity of illness  Planned Discharge Destination: Home  DVT prophylaxis.  Eliquis Time spent: 42 minutes  This record has been created using Conservation officer, historic buildings. Errors have been sought and corrected,but may not always be  located. Such creation errors do not reflect on the standard of care.   Author: Arnetha Courser, MD 04/27/2023 1:56 PM  For on call review [email or web address].

## 2023-04-28 ENCOUNTER — Encounter: Payer: Medicare Other | Admitting: *Deleted

## 2023-04-28 DIAGNOSIS — R188 Other ascites: Secondary | ICD-10-CM | POA: Diagnosis not present

## 2023-04-28 DIAGNOSIS — J810 Acute pulmonary edema: Secondary | ICD-10-CM | POA: Diagnosis not present

## 2023-04-28 DIAGNOSIS — N184 Chronic kidney disease, stage 4 (severe): Secondary | ICD-10-CM | POA: Diagnosis not present

## 2023-04-28 DIAGNOSIS — I5043 Acute on chronic combined systolic (congestive) and diastolic (congestive) heart failure: Secondary | ICD-10-CM | POA: Diagnosis not present

## 2023-04-28 LAB — BASIC METABOLIC PANEL
Anion gap: 11 (ref 5–15)
BUN: 66 mg/dL — ABNORMAL HIGH (ref 8–23)
CO2: 26 mmol/L (ref 22–32)
Calcium: 8.7 mg/dL — ABNORMAL LOW (ref 8.9–10.3)
Chloride: 99 mmol/L (ref 98–111)
Creatinine, Ser: 2.86 mg/dL — ABNORMAL HIGH (ref 0.61–1.24)
GFR, Estimated: 22 mL/min — ABNORMAL LOW (ref >60)
Glucose, Bld: 111 mg/dL — ABNORMAL HIGH (ref 70–99)
Potassium: 4 mmol/L (ref 3.5–5.1)
Sodium: 136 mmol/L (ref 135–145)

## 2023-04-28 NOTE — Care Management Important Message (Signed)
Important Message  Patient Details  Name: Taylor [name] MRN: [id number] Date of Birth: [birth year]   Medicare Important Message Given:  Yes     Johnell Comings 04/28/2023, 11:35 AM

## 2023-04-28 NOTE — Progress Notes (Addendum)
[facility]  ROUNDING NOTE   Subjective:   [name] was admitted to [facility] on 04/23/2023 for Anasarca [R60.1] Abdominal distension [R14.0] Acute exacerbation of CHF (congestive heart failure) (HCC) [I50.9] Fall, initial encounter [W19.XXXA] Traumatic injury of head, initial encounter [S09.90XA] Acute on chronic congestive heart failure, unspecified heart failure type (HCC) [I50.9]  UOP 2.1L  Patient sitting on side of bed Room air Lower extremity edema remains but improving  Creatinine 2.86 (3.09) (3.24)  Objective:  Vital signs in last 24 hours:  Temp:  [96.3 F (35.7 C)-97.6 F (36.4 C)] 97.6 F (36.4 C) (06/03 1252) Pulse Rate:  [56-66] 56 (06/03 1252) Resp:  [16-22] 22 (06/03 1252) BP: (102-124)/(72-88) 110/86 (06/03 1252) SpO2:  [94 %-100 %] 94 % (06/03 1252) Weight:  [84.6 kg] 84.6 kg (06/03 1046)  Weight change:  Filed Weights   04/23/23 1234 04/28/23 1046  Weight: 88.5 kg 84.6 kg    Intake/Output: I/O last 3 completed shifts: In: -  Out: 2100 [Urine:2100]   Intake/Output this shift:  Total I/O In: 480 [P.O.:480] Out: 700 [Urine:700]  Physical Exam: General: NAD,   Head: Normocephalic, atraumatic. Moist oral mucosal membranes  Eyes: Anicteric  Lungs:  Bilateral rales , diminished lung sounds  Heart: irregular  Abdomen:  Soft, nontender,   Extremities:  ++ peripheral edema.  Neurologic: Nonfocal, moving all four extremities  Skin: No lesions        Basic Metabolic Panel: Recent Labs  Lab 04/24/23 0456 04/25/23 0613 04/26/23 0612 04/27/23 0551 04/28/23 0541  NA 139 136 134* 134* 136  K 3.7 3.8 4.2 4.0 4.0  CL 100 99 98 99 99  CO2 30 28 27 27 26   GLUCOSE 104* 105* 121* 113* 111*  BUN 61* 64* 66* 69* 66*  CREATININE 3.33* 3.20* 3.24* 3.09* 2.86*  CALCIUM 8.8* 8.5* 8.6* 8.6* 8.7*     Liver Function Tests: Recent Labs  Lab 04/23/23 1358  AST 31  ALT 10  ALKPHOS 140*  BILITOT 2.3*  PROT 6.8  ALBUMIN 2.6*     No results for input(s): "LIPASE", "AMYLASE" in the last 168 hours. No results for input(s): "AMMONIA" in the last 168 hours.  CBC: Recent Labs  Lab 04/23/23 1358 04/24/23 0456 04/25/23 0613  WBC 4.3 3.9* 5.7  NEUTROABS 2.6  --   --   HGB 11.8* 12.3* 11.9*  HCT 39.9 40.4 39.1  MCV 100.5* 99.0 98.7  PLT 110* 107* 102*     Cardiac Enzymes: No results for input(s): "CKTOTAL", "CKMB", "CKMBINDEX", "TROPONINI" in the last 168 hours.  BNP: Invalid input(s): "POCBNP"  CBG: No results for input(s): "GLUCAP" in the last 168 hours.  Microbiology: Results for orders placed or performed during the hospital encounter of 04/03/23  SARS Coronavirus 2 by RT PCR (hospital order, performed in [facility] hospital lab) *cepheid single result test* Anterior Nasal Swab     Status: None   Collection Time: 04/03/23 11:03 AM   Specimen: Anterior Nasal Swab  Result Value Ref Range Status   SARS Coronavirus 2 by RT PCR NEGATIVE NEGATIVE Final    Comment: (NOTE) SARS-CoV-2 target nucleic acids are NOT DETECTED.  The SARS-CoV-2 RNA is generally detectable in upper and lower respiratory specimens during the acute phase of infection. The lowest concentration of SARS-CoV-2 viral copies this assay can detect is 250 copies / mL. A negative result does not preclude SARS-CoV-2 infection and should not be used as the sole basis for treatment or other patient  management decisions.  A negative result may occur with improper specimen collection / handling, submission of specimen other than nasopharyngeal swab, presence of viral mutation(s) within the areas targeted by this assay, and inadequate number of viral copies (<250 copies / mL). A negative result must be combined with clinical observations, patient history, and epidemiological information.  Fact Sheet for Patients:   RoadLapTop.co.za  Fact Sheet for Healthcare  Providers: http://kim-miller.com/  This test is not yet approved or  cleared by the [place] FDA and has been authorized for detection and/or diagnosis of SARS-CoV-2 by FDA under an Emergency Use Authorization (EUA).  This EUA will remain in effect (meaning this test can be used) for the duration of the COVID-19 declaration under Section 564(b)(1) of the Act, 21 U.S.C. section 360bbb-3(b)(1), unless the authorization is terminated or revoked sooner.  Performed at [facility], [address] [place], [place] [address]   Blood culture (routine x 2)     Status: None   Collection Time: 04/03/23 12:11 PM   Specimen: BLOOD  Result Value Ref Range Status   Specimen Description BLOOD BLOOD RIGHT ARM  Final   Special Requests   Final    BOTTLES DRAWN AEROBIC AND ANAEROBIC Blood Culture results may not be optimal due to an inadequate volume of blood received in culture bottles   Culture   Final    NO GROWTH 5 DAYS Performed at [facility], [address] [place], [place] [address]    Report Status 04/08/2023 FINAL  Final  Blood culture (routine x 2)     Status: None   Collection Time: 04/03/23 12:16 PM   Specimen: BLOOD  Result Value Ref Range Status   Specimen Description BLOOD BLOOD LEFT HAND  Final   Special Requests   Final    BOTTLES DRAWN AEROBIC AND ANAEROBIC Blood Culture results may not be optimal due to an inadequate volume of blood received in culture bottles   Culture   Final    NO GROWTH 5 DAYS Performed at [facility], [address] [place], [place] [address]    Report Status 04/08/2023 FINAL  Final  Body fluid culture w Gram Stain     Status: None   Collection Time: 04/04/23 10:11 AM   Specimen: PATH Cytology Peritoneal fluid  Result Value Ref Range Status   Specimen Description   Final    PERITONEAL Performed at [facility], [address], [place], [place] [address]    Special Requests   Final     NONE Performed at [facility], [address] [place], [place] [address]    Gram Stain   Final    RARE WBC PRESENT, PREDOMINANTLY MONONUCLEAR NO ORGANISMS SEEN    Culture   Final    NO GROWTH 3 DAYS Performed at [facility], [address] [place], [place] [address]    Report Status 04/08/2023 FINAL  Final    Coagulation Studies: No results for input(s): "LABPROT", "INR" in the last 72 hours.  Urinalysis: No results for input(s): "COLORURINE", "LABSPEC", "PHURINE", "GLUCOSEU", "HGBUR", "BILIRUBINUR", "KETONESUR", "PROTEINUR", "UROBILINOGEN", "NITRITE", "LEUKOCYTESUR" in the last 72 hours.  Invalid input(s): "APPERANCEUR"    Imaging: No results found.   Medications:    albumin human      allopurinol  150 mg Oral Daily   apixaban  2.5 mg Oral BID   aspirin EC  81 mg Oral Daily   atorvastatin  40 mg Oral Daily  carvedilol  6.25 mg Oral BID WC   dextromethorphan-guaiFENesin  1 tablet Oral BID   furosemide  80 mg Intravenous BID   isosorbide mononitrate  15 mg Oral Daily   levofloxacin  500 mg Oral Q48H   pantoprazole  40 mg Oral Daily   potassium chloride SA  20 mEq Oral Daily   spironolactone  25 mg Oral Daily   acetaminophen **OR** acetaminophen, albumin human, HYDROcodone-acetaminophen, ipratropium-albuterol, ondansetron **OR** ondansetron (ZOFRAN) IV, senna-docusate  Assessment/ Plan:  [name] is a 82 y.o.  male with peripheral vascular disease, diabetes mellitus type II, hypertension, coronary artery disease status post CABG, hyperlipidemia, AICD, chronic congestive heart failure and gout who is admitted to [facility] on5/29/2024 for Anasarca [R60.1] Abdominal distension [R14.0] Acute exacerbation of CHF (congestive heart failure) (HCC) [I50.9] Fall, initial encounter [W19.XXXA] Traumatic injury of head, initial encounter [S09.90XA] Acute on chronic congestive heart failure, unspecified heart failure type (HCC) [I50.9]   Acute Kidney  Injuyry on chronic kidney disease stage IV: baseline creatinine of 2.93, GFR of 21 on 5/32/24.  - holding empagliflozin - not on RAAS inhibition - Creatinine has returned to baseline -Has follow up appt in our office next week  Hypertension and acute exacerbation of chronic systolic and diastolic congestive heart failure. Status post IV furosemide and IV albumin.  - Continue spironolactone PO 25mg  daily - Continue furosemide IV 80mg  BID - fluid restriction in place  Secondary Hyperparathyroidism: not currently on activated vitamin D. Calcium acceptable   Diabetes mellitus type II with chronic kidney disease:  noninsulin dependent. Hemoglobin A1c of 6.5% on 12/24/22. - Holding empagliflozin.    LOS: 5 Maysun Meditz 6/3/20242:51 PM

## 2023-04-28 NOTE — Progress Notes (Signed)
[facility] CARDIOLOGY CONSULT NOTE       Patient ID: Taylor [name] MRN: [id number] DOB/AGE: 1941/08/18 82 y.o.  Admit date: 04/23/2023 Referring Physician Dr. Arnetha Courser  Primary Physician Dr. Sherrie Mustache Primary Cardiologist Dr. Darrold Junker Reason for Consultation AoCHF  HPI: Taylor [name] is an 62yoM with a PMH of PMH of chronic HFrEF (EF 20%, moderate MR 02/17/2023), CAD s/p CABG 08/2016, hx VF arrest s/p MI in 1994 s/p dual-chamber ICD (2014) and recent battery change out 02/13/2023, paroxysmal AF (Eliquis), CKD 4, AAA s/p EVAR (2008), COPD, DM2 who presented to [facility] ED 04/23/2023 after a fall. Cardiology is consulted for assistance with his HF.   Interval History:  - feels better today, brisk UOP after addition of spironolactone. Lasix IV increased by nephrology to 80mg  BID - continued slight improvement in creatinine. - on room air, no chest pain or dyspnea. Still has a productive cough. Peripheral edema remains   Review of systems complete and found to be negative unless listed above     Past Medical History:  Diagnosis Date   AAA (abdominal aortic aneurysm) (HCC) 06/03/2007   [facility]; Dr. Hart Rochester   AICD (automatic cardioverter/defibrillator) present    Arrhythmia    atrial fibrillation   Barrett's esophagus    Bladder cancer (HCC)    Bradycardia    CAD (coronary artery disease)    CAP (community acquired pneumonia) 11/13/2019   CHF (congestive heart failure) (HCC)    Cluster headache    COVID-19 09/27/2021   DDD (degenerative disc disease), lumbar    Diabetes mellitus without complication (HCC)    Dyspnea    WITH EXERTION   Edema    LEFT ANKLE   Fracture of skull base (HCC) 1997   due to fall   GERD (gastroesophageal reflux disease)    Gout    History of bladder cancer 12/1995   Hyperlipidemia    Hypertension    Hypocalcemia 04/19/2020   Possibly secondary to diuretics.   Low=5.9 04/19/2020   Malignant melanoma (HCC) 12/2012   right  dorsal forearm excised   Myocardial infarction [facility])    LAST 2014   Osteoarthritis of knee    Other specified complication of vascular prosthetic devices, implants and grafts, initial encounter (HCC) 08/22/2021   Pacemaker 10/10/2006   Pancreatitis 11/22/2019   Pneumonia    2016   Pre-diabetes    Psoriasis    Rib fracture 1997   due to fall   Sleep apnea    CPAP   Stroke [facility])    Venous incompetence     Past Surgical History:  Procedure Laterality Date   ABDOMINAL AORTIC ANEURYSM REPAIR  06/03/2007   [facility]; Dr. Hart Rochester   ANGIOPLASTY  1994   MI   BLADDER TUMOR EXCISION  12/1995   CAROTID PTA/STENT INTERVENTION Left 07/23/2022   Procedure: CAROTID PTA/STENT INTERVENTION;  Surgeon: Renford Dills, MD;  Location: [facility] INVASIVE CV LAB;  Service: Cardiovascular;  Laterality: Left;   CATARACT EXTRACTION W/PHACO Left 10/22/2016   Procedure: CATARACT EXTRACTION PHACO AND INTRAOCULAR LENS PLACEMENT (IOC);  Surgeon: Galen Manila, MD;  Location: [facility] ORS;  Service: Ophthalmology;  Laterality: Left;  [place] 47.7 AP% 18.4 CDE 8.78 Fluid pack lot # [id number]   CATARACT EXTRACTION W/PHACO Right 12/10/2016   Procedure: CATARACT EXTRACTION PHACO AND INTRAOCULAR LENS PLACEMENT (IOC);  Surgeon: Galen Manila, MD;  Location: [facility] ORS;  Service: Ophthalmology;  Laterality: Right;  [place] 00:39 AP% 23.3 CDE 9.13 Fluid  pack lot # [id number]   CORONARY ANGIOPLASTY     STENTS X 5   CORONARY ARTERY BYPASS GRAFT  09/22/2006   four   ELBOW BURSA SURGERY     DUE TO GOUT   INSERT / REPLACE / REMOVE PACEMAKER     MELANOMA EXCISION  12/2012   Right forearm   PPM GENERATOR CHANGEOUT N/A 02/13/2023   Procedure: PPM GENERATOR CHANGEOUT;  Surgeon: Marcina Millard, MD;  Location: [facility] INVASIVE CV LAB;  Service: Cardiovascular;  Laterality: N/A;    Medications Prior to Admission  Medication Sig Dispense Refill Last Dose   acetaminophen (TYLENOL) 500 MG tablet Take 500 mg by mouth  every 6 (six) hours as needed for mild pain.   prn at unk   albuterol (VENTOLIN HFA) 108 (90 Base) MCG/ACT inhaler Inhale 2 puffs into the lungs every 6 (six) hours as needed for wheezing or shortness of breath. 8 g 2 prn at unk   allopurinol (ZYLOPRIM) 300 MG tablet Take 150 mg by mouth daily.   04/23/2023   apixaban (ELIQUIS) 5 MG TABS tablet Take 5 mg by mouth daily.   04/23/2023 at 0700   aspirin 81 MG EC tablet Take 81 mg by mouth daily.    04/23/2023   atorvastatin (LIPITOR) 40 MG tablet TAKE 1 TABLET(40 MG) BY MOUTH DAILY (Patient taking differently: Take 40 mg by mouth daily.) 90 tablet 1 04/23/2023   carvedilol (COREG) 6.25 MG tablet Take 1 tablet (6.25 mg total) by mouth 2 (two) times daily with a meal. 60 tablet 0 04/23/2023   empagliflozin (JARDIANCE) 10 MG TABS tablet Take 10 mg by mouth daily.   04/23/2023   isosorbide mononitrate (IMDUR) 30 MG 24 hr tablet TAKE 1/2 TABLET(15 MG) BY MOUTH DAILY (Patient taking differently: Take 15 mg by mouth daily.) 45 tablet 2 04/23/2023   Magnesium 200 MG TABS Take 1 tablet (200 mg total) by mouth in the morning and at bedtime. 60 tablet 5 04/23/2023   pantoprazole (PROTONIX) 40 MG tablet TAKE 1 TABLET BY MOUTH EVERY DAY 90 tablet 4 04/23/2023   potassium chloride SA (KLOR-CON M) 20 MEQ tablet Take 20 mEq by mouth daily.   04/23/2023   torsemide (DEMADEX) 20 MG tablet 60 mg (3 tablets) every morning and 40 mg (2 tablets) every afternoon/evening (Patient taking differently: Take 40-60 mg by mouth 2 (two) times daily. Take 3 tablets every morning and 2 tablets every evening) 90 tablet 0 04/23/2023   triamcinolone ointment (KENALOG) 0.1 % APPLY TOPICALLY TWICE DAILY AS DIRECTED 454 g 3 04/23/2023   [EXPIRED] ipratropium-albuterol (DUONEB) 0.5-2.5 (3) MG/3ML SOLN Inhale 3 mLs into the lungs every 4 (four) hours as needed (wheezing, shob).   prn at unk   metolazone (ZAROXOLYN) 5 MG tablet Take 5 mg by mouth daily. (Patient not taking: Reported on 04/23/2023)   Not  Taking    Social History   Socioeconomic History   Marital status: Married    Spouse name: [name]   Number of children: 3   Years of education: Not on file   Highest education level: 12th grade  Occupational History   Occupation: retired    Comment: previously worked as a Chief Financial Officer  Tobacco Use   Smoking status: Former    Packs/day: 1.00    Years: 30.00    Additional pack years: 0.00    Total pack years: 30.00    Types: Cigarettes    Quit date: 11/26/1999    Years since quitting: 23.4  Smokeless tobacco: Never  Vaping Use   Vaping Use: Never used  Substance and Sexual Activity   Alcohol use: No   Drug use: No   Sexual activity: Not Currently  Other Topics Concern   Not on file  Social History Narrative   Lives at home with his family.  Independent at baseline.   Social Determinants of Health   Financial Resource Strain: Low Risk  (05/07/2022)   Overall Financial Resource Strain (CARDIA)    Difficulty of Paying Living Expenses: Not hard at all  Food Insecurity: No Food Insecurity (04/24/2023)   Hunger Vital Sign    Worried About Running Out of Food in the Last Year: Never true    Ran Out of Food in the Last Year: Never true  Transportation Needs: No Transportation Needs (04/24/2023)   PRAPARE - Administrator, Civil Service (Medical): No    Lack of Transportation (Non-Medical): No  Physical Activity: Insufficiently Active (04/18/2021)   Exercise Vital Sign    Days of Exercise per Week: 1 day    Minutes of Exercise per Session: 120 min  Stress: No Stress Concern Present (05/07/2022)   [employer] of Occupational Health - Occupational Stress Questionnaire    Feeling of Stress : Not at all  Social Connections: Moderately Isolated (05/07/2022)   Social Connection and Isolation Panel [NHANES]    Frequency of Communication with Friends and Family: Never    Frequency of Social Gatherings with Friends and Family: Once a week    Attends Religious Services:  More than 4 times per year    Active Member of [employer] or Organizations: No    Attends Banker Meetings: Never    Marital Status: Married  Catering manager Violence: Not At Risk (04/24/2023)   Humiliation, Afraid, Rape, and Kick questionnaire    Fear of Current or Ex-Partner: No    Emotionally Abused: No    Physically Abused: No    Sexually Abused: No    Family History  Problem Relation Age of Onset   Cancer Mother        Melanoma skin cancer   Heart attack Father 65   Cancer Father        throat cancer   Arthritis Brother       Intake/Output Summary (Last 24 hours) at 04/28/2023 1249 Last data filed at 04/28/2023 1227 Gross per 24 hour  Intake 240 ml  Output 2200 ml  Net -1960 ml     Vitals:   04/27/23 2353 04/28/23 0403 04/28/23 0828 04/28/23 1046  BP: 120/88 102/72 113/79   Pulse: 61 62 62   Resp: 19 18 20    Temp: (!) 97.5 F (36.4 C) (!) 97.5 F (36.4 C) 97.6 F (36.4 C)   TempSrc:      SpO2: 95% 98% 100%   Weight:    84.6 kg  Height:    5\' 10"  (1.778 m)    PHYSICAL EXAM General: chronically ill appearing elderly male, well nourished, in no acute distress sitting up with legs off bed, close friend at bedside HEENT:  Normocephalic and atraumatic. Neck:  No JVD.  Lungs: Normal respiratory effort on room air. Decreased breath sounds with prolonged expiratory phase + expiratory wheezing Heart: irregular irregular with controlled rate . Normal S1 and S2 without gallops or murmurs.  Abdomen: Non-distended appearing.  Msk: Normal strength and tone for age. Extremities:  No clubbing, cyanosis. Trace to 1+ LE edema, worse on the left Neuro: Alert and oriented  X 3. Psych:  Answers questions appropriately.   Labs: Basic Metabolic Panel: Recent Labs    04/27/23 0551 04/28/23 0541  NA 134* 136  K 4.0 4.0  CL 99 99  CO2 27 26  GLUCOSE 113* 111*  BUN 69* 66*  CREATININE 3.09* 2.86*  CALCIUM 8.6* 8.7*    Liver Function Tests: No results for  input(s): "AST", "ALT", "ALKPHOS", "BILITOT", "PROT", "ALBUMIN" in the last 72 hours.  No results for input(s): "LIPASE", "AMYLASE" in the last 72 hours. CBC: No results for input(s): "WBC", "NEUTROABS", "HGB", "HCT", "MCV", "PLT" in the last 72 hours.  Cardiac Enzymes: No results for input(s): "CKTOTAL", "CKMB", "CKMBINDEX", "TROPONINIHS" in the last 72 hours.  BNP: No results for input(s): "BNP" in the last 72 hours.  D-Dimer: No results for input(s): "DDIMER" in the last 72 hours. Hemoglobin A1C: No results for input(s): "HGBA1C" in the last 72 hours. Fasting Lipid Panel: No results for input(s): "CHOL", "HDL", "LDLCALC", "TRIG", "CHOLHDL", "LDLDIRECT" in the last 72 hours. Thyroid Function Tests: No results for input(s): "TSH", "T4TOTAL", "T3FREE", "THYROIDAB" in the last 72 hours.  Invalid input(s): "FREET3" Anemia Panel: No results for input(s): "VITAMINB12", "FOLATE", "FERRITIN", "TIBC", "IRON", "RETICCTPCT" in the last 72 hours.   Radiology: US Paracentesis  Result Date: 04/24/2023 INDICATION: Patient with past medical history of congestive heart failure and chronic kidney disease with hepatic steatosis with recurrent ascites request received for therapeutic paracentesis. EXAM: ULTRASOUND GUIDED  PARACENTESIS MEDICATIONS: Local 1% lidocaine only. COMPLICATIONS: None immediate. PROCEDURE: Informed written consent was obtained from the patient after a discussion of the risks, benefits and alternatives to treatment. A timeout was performed prior to the initiation of the procedure. Initial ultrasound scanning demonstrates a large amount of ascites within the right lower abdominal quadrant. The right lower abdomen was prepped and draped in the usual sterile fashion. 1% lidocaine was used for local anesthesia. Following this, a 19 gauge, 7-cm, Yueh catheter was introduced. An ultrasound image was saved for documentation purposes. The paracentesis was performed. The catheter was removed  and a dressing was applied. The patient tolerated the procedure well without immediate post procedural complication. Albumin ordered per primary team. FINDINGS: A total of approximately 6.8 Liters of amber colored fluid was removed. IMPRESSION: Successful ultrasound-guided paracentesis yielding 6.8 liters of peritoneal fluid. This exam was performed by Pattricia Boss PA-C, and was supervised and interpreted by Dr. Juliette Alcide. Electronically Signed   By: Olive Bass M.D.   On: 04/24/2023 10:41   DG Shoulder Right  Result Date: 04/23/2023 CLINICAL DATA:  Trauma, fall, pain EXAM: RIGHT SHOULDER - 2+ VIEW COMPARISON:  None Available. FINDINGS: No recent fracture or dislocation is seen. Deformity in the shaft of right clavicle has not changed in comparison with the study done on 04/03/2023 suggesting old healed fracture. IMPRESSION: No recent fracture or dislocation is seen in right shoulder. Electronically Signed   By: Ernie Avena M.D.   On: 04/23/2023 14:12   DG Chest Port 1 View  Result Date: 04/23/2023 CLINICAL DATA:  Trauma, fall EXAM: PORTABLE CHEST 1 VIEW COMPARISON:  04/03/2023 FINDINGS: Heart is enlarged in size. There is previous coronary bypass surgery. Pacemaker/defibrillator battery is seen in left infraclavicular region. Central pulmonary vessels are prominent. Increased interstitial markings are seen in the parahilar regions and lower lung fields. There is blunting of right lateral CP angle. There is no pneumothorax. IMPRESSION: Cardiomegaly. Increased interstitial markings are seen in the parahilar regions and lower lung fields suggesting possible mild interstitial pulmonary edema.  There is no focal pulmonary consolidation. Small right pleural effusion. Electronically Signed   By: Ernie Avena M.D.   On: 04/23/2023 14:10   CT Head Wo Contrast  Result Date: 04/23/2023 CLINICAL DATA:  Head trauma, moderate-severe; Neck trauma (Age >= 65y). EXAM: CT HEAD WITHOUT CONTRAST CT CERVICAL  SPINE WITHOUT CONTRAST TECHNIQUE: Multidetector CT imaging of the head and cervical spine was performed following the standard protocol without intravenous contrast. Multiplanar CT image reconstructions of the cervical spine were also generated. RADIATION DOSE REDUCTION: This exam was performed according to the departmental dose-optimization program which includes automated exposure control, adjustment of the mA and/or kV according to patient size and/or use of iterative reconstruction technique. COMPARISON:  Head CT 10/27/2022. FINDINGS: CT HEAD FINDINGS Brain: No acute hemorrhage. Unchanged old infarct in the medial aspect of the left occipital lobe. No new loss of gray-white differentiation. No hydrocephalus or extra-axial collection. No mass effect or midline shift. Vascular: No hyperdense vessel or unexpected calcification. Skull: No acute fracture. Old fracture deformity along left parietal and squamous temporal bones. Sinuses/Orbits: Chronic bilateral maxillary and sphenoid sinusitis. Orbits are unremarkable. Other: None. CT CERVICAL SPINE FINDINGS Alignment: Normal. Skull base and vertebrae: No acute fracture. Normal craniocervical junction. No suspicious bone lesions. Soft tissues and spinal canal: No prevertebral fluid or swelling. No visible canal hematoma. Disc levels: Multilevel cervical spondylosis, worst at C6-7, where there is at least moderate spinal canal stenosis. Upper chest: Large right pleural effusion. Other: Left carotid stent in place. IMPRESSION: 1. No acute intracranial abnormality. Old left PCA territory infarct. 2. No acute cervical spine fracture or traumatic malalignment. 3. Large right pleural effusion. Electronically Signed   By: Orvan Falconer M.D.   On: 04/23/2023 13:56   CT Cervical Spine Wo Contrast  Result Date: 04/23/2023 CLINICAL DATA:  Head trauma, moderate-severe; Neck trauma (Age >= 65y). EXAM: CT HEAD WITHOUT CONTRAST CT CERVICAL SPINE WITHOUT CONTRAST TECHNIQUE:  Multidetector CT imaging of the head and cervical spine was performed following the standard protocol without intravenous contrast. Multiplanar CT image reconstructions of the cervical spine were also generated. RADIATION DOSE REDUCTION: This exam was performed according to the departmental dose-optimization program which includes automated exposure control, adjustment of the mA and/or kV according to patient size and/or use of iterative reconstruction technique. COMPARISON:  Head CT 10/27/2022. FINDINGS: CT HEAD FINDINGS Brain: No acute hemorrhage. Unchanged old infarct in the medial aspect of the left occipital lobe. No new loss of gray-white differentiation. No hydrocephalus or extra-axial collection. No mass effect or midline shift. Vascular: No hyperdense vessel or unexpected calcification. Skull: No acute fracture. Old fracture deformity along left parietal and squamous temporal bones. Sinuses/Orbits: Chronic bilateral maxillary and sphenoid sinusitis. Orbits are unremarkable. Other: None. CT CERVICAL SPINE FINDINGS Alignment: Normal. Skull base and vertebrae: No acute fracture. Normal craniocervical junction. No suspicious bone lesions. Soft tissues and spinal canal: No prevertebral fluid or swelling. No visible canal hematoma. Disc levels: Multilevel cervical spondylosis, worst at C6-7, where there is at least moderate spinal canal stenosis. Upper chest: Large right pleural effusion. Other: Left carotid stent in place. IMPRESSION: 1. No acute intracranial abnormality. Old left PCA territory infarct. 2. No acute cervical spine fracture or traumatic malalignment. 3. Large right pleural effusion. Electronically Signed   By: Orvan Falconer M.D.   On: 04/23/2023 13:56   US Paracentesis  Result Date: 04/04/2023 INDICATION: 82 year old male with CHF exacerbation with recurrent ascites. Request received for diagnostic and therapeutic paracentesis. EXAM: ULTRASOUND GUIDED DIAGNOSTIC AND  THERAPEUTIC LEFT LOWER  QUADRANT PARACENTESIS MEDICATIONS: 20 mL 1% lidocaine COMPLICATIONS: None immediate. PROCEDURE: Informed written consent was obtained from the patient after a discussion of the risks, benefits and alternatives to treatment. A timeout was performed prior to the initiation of the procedure. Initial ultrasound scanning demonstrates a moderate amount of ascites within the left lower abdominal quadrant. The right lower abdomen was prepped and draped in the usual sterile fashion. 1% lidocaine was used for local anesthesia. Following this, a 19 gauge, 7-cm, Yueh catheter was introduced. An ultrasound image was saved for documentation purposes. The paracentesis was performed. The catheter was removed and a dressing was applied. The patient tolerated the procedure well without immediate post procedural complication. FINDINGS: A total of approximately 6.6 L of clear, amber fluid was removed. Samples were sent to the laboratory as requested by the clinical team. IMPRESSION: Successful ultrasound-guided paracentesis yielding 6.6 liters of peritoneal fluid. Procedure performed by Alex Gardener, NP and supervised by Malachy Moan, MD Electronically Signed   By: Malachy Moan M.D.   On: 04/04/2023 12:51   CT ABDOMEN PELVIS WO CONTRAST  Result Date: 04/03/2023 CLINICAL DATA:  Abdominal pain EXAM: CT ABDOMEN AND PELVIS WITHOUT CONTRAST TECHNIQUE: Multidetector CT imaging of the abdomen and pelvis was performed following the standard protocol without IV contrast. RADIATION DOSE REDUCTION: This exam was performed according to the departmental dose-optimization program which includes automated exposure control, adjustment of the mA and/or kV according to patient size and/or use of iterative reconstruction technique. COMPARISON:  CT 02/25/2023 FINDINGS: Lower chest: Enlarged heart. Status post median sternotomy. Defibrillator leads along the right side of the heart. Persistent small to moderate right pleural effusion. There is  some bilateral areas of pleural thickening and some calcification. There is some bandlike changes along the lung bases once again. Areas of interstitial septal thickening. Hepatobiliary: Nodular contours of the liver. No obvious lesion on noncontrast imaging. Gallbladder is nondilated. Pancreas: Moderate atrophy of the pancreas, unchanged. Spleen: Spleen is nonenlarged. Adrenals/Urinary Tract: Adrenal glands are preserved. Severe atrophy of the left kidney. No abnormal calcification seen within either kidney nor along the course of either ureter. Preserved contours of the urinary bladder. 7 mm hyperdense lesion along the upper pole of the right kidney, unchanged from previous. Bosniak 2 lesion. No specific follow-up. Stomach/Bowel: Stomach is distended with fluid. The large bowel has a normal course and caliber with scattered stool. Left-sided colonic diverticula. Small bowel is nondilated. Third portion duodenal diverticulum. Vascular/Lymphatic: Normal caliber IVC. Aortic endograft in place once again, incompletely evaluated on this noncontrast examination. Scattered vascular calcifications otherwise. There are some small nodes identified in the retroperitoneum and pelvis which are not pathologic by size criteria and unchanged from previous. Reproductive: Heterogeneous prostate. Other: Moderate ascites once again identified, similar to previous. Anasarca. There is some fluid along the right inguinal canal. Musculoskeletal: Moderate degenerative changes of the spine and pelvis. Trace retrolisthesis of L2 on L3 and L1 on L2. Multilevel stenosis. IMPRESSION: Overall appearance is similar to previous. Moderate ascites once again identified with stranding and anasarca. Nodular liver. No bowel obstruction, free air.  Colonic diverticula. Aortic endograft incompletely evaluated without contrast. Severe atrophy of the left kidney. Enlarged heart. Postop chest with defibrillator. Stable moderate right pleural effusion.  Bilateral pleural thickening with calcification. Please correlate for any history of the environmental exposure. Electronically Signed   By: Karen Kays M.D.   On: 04/03/2023 12:31   DG Chest Portable 1 View  Result Date: 04/03/2023 CLINICAL DATA:  Shortness of breath EXAM: PORTABLE CHEST 1 VIEW COMPARISON:  CXR 02/25/23 FINDINGS: Left-sided dual lead cardiac device with unchanged lead positioning. Status post median sternotomy and CABG. Unchanged enlarged cardiac contours. Small right and likely a layering left-sided pleural effusion. There are hazy bibasilar airspace opacity which could represent atelectasis or infection. There are prominent bilateral interstitial opacities, favored to represent pulmonary edema. No radiographically apparent displaced rib fractures IMPRESSION: 1. Cardiomegaly, mild to moderate pulmonary edema, and small bilateral pleural effusions. 2. Hazy bibasilar airspace opacities could represent atelectasis or infection. Electronically Signed   By: Lorenza Cambridge M.D.   On: 04/03/2023 11:36    ECHO  TEE 02/17/2023  1. TEE TO ASSESS MV REGURGITATION.    2. NORMAL APPEARING MV WITH FUNCTIONAL MODERATE MR (PISA radius = 0.6 cm,    VC = 0.45 cm, EROA = 0.14 cm2, Regurg Volume = 23 mL). THERE IS BLUNTING    OF THE PULMONARY VEIN S WAVE WITHOUT REVERSAL.    3. SEVERE LV AND RV SYSTOLIC DYSFUNCTION EF = 20%.    4. MILD AR, TRIVIAL PR, MODERATE TR.    5. NO LAA THROMBUS PRESENT.   TELEMETRY reviewed by me (LT) 04/28/2023 : underlying AF with occasional V-pacing rate 60s  EKG reviewed by me: AF 64 incomplete RBBB similar to priors  Data reviewed by me (LT) 04/28/2023: nephrology note, hospitalist progress note, last 24h vitals tele labs imaging I/O    Principal Problem:   Acute on chronic combined systolic and diastolic CHF (congestive heart failure) (HCC) Active Problems:   CAD S/P percutaneous coronary angioplasty   Chronic kidney disease, stage 4 (severe) (HCC)    Hypercholesteremia   Obstructive sleep apnea   History of abdominal aortic aneurysm (AAA)   History of CVA (cerebrovascular accident)   Essential (primary) hypertension   Atrial fibrillation (HCC)   Aortic atherosclerosis (HCC)   Implantable cardioverter-defibrillator (ICD) in situ   Acute pulmonary edema (HCC)   DOE (dyspnea on exertion)   Chronic obstructive pulmonary disease, unspecified COPD type (HCC)   Abdominal ascites    ASSESSMENT AND PLAN:  Taylor [name] is an 60yoM with a PMH of PMH of chronic HFrEF (EF 20%, moderate MR 02/17/2023), CAD s/p CABG 08/2016, hx VF arrest s/p MI in 1994 s/p dual-chamber ICD (2014) and recent battery change out 02/13/2023, paroxysmal AF (Eliquis), CKD 4, AAA s/p EVAR (2008), COPD, DM2 who presented to [facility] ED 04/23/2023 after a fall. Cardiology is consulted for assistance with his HF.   # mechanical fall No preceding dizziness, lightheadedness, chest pain. Fortunately no acute orthopedic injury or head injury by imaging.   # acute hypoxic respiratory failure # ? PNA vs COPD exacerbation - wheezing on exam today, on renally dosed levaquin per primary   # anasarca # acute on chronic HFrEF # moderate MR S/p LVP with IR yielding 6.8L fluid on 5/30. Suspect dietary indiscretions with multiple chronic issues (HFrEF, CKD, and cirrhosis) contributory to repeat admissions.  - on lasix IV 80mg  BID per nephrology - spironolactone 25mg  daily added 6/2 by nephrology, great UOP after this addition - continue coreg 6.25mg  BID, other GDMT with ARB/ARNI, SGLT2i limited by CKD + low normal BP  - possibly restart torsemide 60mg  PO BID at discharge - stressed importance of adherence to low sat diet + fluid restriction. <1L per day per nephrology recommendations - defer additional cardiac diagnostics - will need close follow up with advanced HF at discharge.   # CKD 4 Monitor closely  with diuresis.  -continued improvement with Cr trend 3.24 -- 3.09 -- 2.86 and GFR  22 today  # paroxysmal AF  # hx VF arrest s/p medtronic dual-chamber ICD In rate controlled AF by admission EKG. ventricular paced rate in the 60s on telemetry this a.m.  - continue coreg and eliquis 2.5mg  BID for stroke prevention  # CAD s/p CABG  # Demand ischemia Chest pain-free.  Troponin 20, 19. C/w demand in the setting of acute on chronic CHF and chronically elevated with renal insufficiency and not ACS. - continue aspirin 81mg  daily  - continue atorva 40mg  daily - continue imdur 15mg  daily   Anticipate discharge readiness from a cardiac perspective tomorrow AM after continued IV diuresis today.  This patient's plan of care was discussed and created with Dr. Darrold Junker and he is in agreement.  Signed: Rebeca Allegra , PA-C 04/28/2023, 12:49 PM [facility]

## 2023-04-28 NOTE — Progress Notes (Signed)
Progress Note   Patient: Taylor [name] YQM:[id number] DOB: [birth year] DOA: 04/23/2023     5 DOS: the patient was seen and examined on 04/28/2023   Brief hospital course: 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  Vitals in the ED showed temperature 98, respiration rate of 18, heart rate of 62, blood pressure 168/83, SpO2 of 96% on room air.  Serum sodium is 140, potassium 3.6, chloride 101, bicarb 29, BUN of 62, serum creatinine of 3.17, EGFR of 19, nonfasting blood glucose 91, WBC 4.3, hemoglobin 11.8, platelets of 110.  Alk phos was elevated at 140.  Albumin level was 2.6.  BNP was elevated 1516.7, high sensitive troponin was 20.  ED treatment: Furosemide 80 mg IV one-time dose  5/30: Vital stable.  Slight worsening of creatinine to 3.33 which remained around his baseline.  S/p paracentesis with removal of 6.8 L of amber-colored fluid. Worsening cough, procalcitonin at 0.25-started p.o. levofloxacin. Prior echocardiogram with EF of less than 20%, his cardiologist was also consulted.  Established patient with [facility] clinic. 5/31: Hemodynamically stable and clinically improving.  Cardiology would like to continue with IV diuresis.  Renal function stable. 6/1: Vital stable.  Slight worsening of creatinine and BUN today.  Holding today's Lasix. Started wheezing-added DuoNeb. 6/2: Hemodynamically stable.  Creatinine with a small improvement to 3.09, nephrology is planning to restart Lasix at 80 mg twice daily along with  spironolactone.  Appreciate nephrology help with diuresis. 6/3: Good UOP with current regimen.  Improving renal function.  Wants to go home, ordered PT, will continue with IV diuresis today and switching to torsemide 60 mg twice daily from tomorrow and discharge.  Patient wants to continue with cardiac rehab and needs reorders on discharge, message sent to cardiology   Assessment and Plan: * Acute on chronic combined systolic and diastolic CHF (congestive heart failure) (HCC) Patient had complete echo on 02/17/2023: Severe LV and RV systolic dysfunction, estimated ejection fraction 20%, mild AR, trivial PR, moderate TR Status post furosemide 80 mg IV one-time dose per EDP Slight improvement in GFR to 20 today, from 19.  UOP recorded is 425 -Nephrology restarted Lasix at 80 mg twice daily, also added spironolactone, with good UOP and improving renal function-we will continue today Strict I's and O's Daily weight and BMP Cardiology consult-  Abdominal ascites Presumed secondary to acute on chronic combined heart failure exacerbation in setting of hepatic steatosis S/p ultrasound-guided paracentesis with removal of 6.5 L of -Continue with IV diuresis  Acute pulmonary edema (HCC) Presumed secondary to heart failure exacerbation,  -Continue with IV diuresis  Chronic kidney disease, stage 4 (severe) (HCC) Started improving with diuresis. Nephrology consult to help with appropriate diuresis -Monitor renal function as patient is being diuresed -Avoid nephrotoxins  Chronic obstructive pulmonary disease, unspecified COPD type (HCC) Worsening cough for the past couple of week, Calcitonin added 0.25 Started him on levofloxacin Home DuoNebs every 4 hours as needed for wheezing and shortness of breath resumed  Essential (primary) hypertension Home Coreg 6.25 mg p.o. twice daily with meals resumed, Imdur 50 mg daily resumed  Atrial  fibrillation (HCC) Home Eliquis not resumed on admission due to  recent fall with multiple skin lacerations, ecchymosis on the right arm and shoulder and ultrasound-guided paracentesis ordered on admission. -Restarting home Eliquis as hemoglobin seems stable   CAD S/P percutaneous coronary angioplasty No chest pain. -Continue with home aspirin, carvedilol and statin  Obstructive sleep apnea -CPAP at night  Hypercholesteremia Home atorvastatin 40 mg daily resumed  DOE (dyspnea on exertion) Presumed secondary to pulmonary edema in setting of combined acute on chronic heart failure exacerbation  Acute exacerbation of CHF (congestive heart failure) (HCC)-resolved as of 04/23/2023 Patient had complete echo on 02/17/2023 which was read as: Severe LV and RV systolic dysfunction, estimated ejection fraction is 20%.  Mild AR, trivial PR, moderate TR. Strict I's and O's Status post furosemide 80 mg IV one-time dose per EDP I have ordered furosemide 60 mg IV twice daily for 5/30/104 Admit to telemetry cardiac, inpatient    Subjective: Patient was seen and examined today.  No new complaints.  He wants to go home.  Physical Exam: Vitals:   04/28/23 0403 04/28/23 0828 04/28/23 1046 04/28/23 1252  BP: 102/72 113/79  110/86  Pulse: 62 62  (!) 56  Resp: 18 20  (!) 22  Temp: (!) 97.5 F (36.4 C) 97.6 F (36.4 C)  97.6 F (36.4 C)  TempSrc:      SpO2: 98% 100%  94%  Weight:   84.6 kg   Height:   5\' 10"  (1.778 m)    General.  Frail elderly man, in no acute distress. Pulmonary.  Lungs clear bilaterally, normal respiratory effort. CV.  Regular rate and rhythm, no JVD, rub or murmur. Abdomen.  Soft, nontender, nondistended, BS positive. CNS.  Alert and oriented .  No focal neurologic deficit. Extremities.  1+ LE edema, no cyanosis, pulses intact and symmetrical. Psychiatry.  Judgment and insight appears normal.    Data Reviewed: Prior data reviewed  Family Communication: Discussed with wife on phone.  Disposition: Status is: Inpatient Remains  inpatient appropriate because: Severity of illness  Planned Discharge Destination: Home  DVT prophylaxis.  Eliquis Time spent: 40 minutes  This record has been created using Conservation officer, historic buildings. Errors have been sought and corrected,but may not always be located. Such creation errors do not reflect on the standard of care.   Author: Arnetha Courser, MD 04/28/2023 3:14 PM  For on call review [email or web address].

## 2023-04-29 ENCOUNTER — Encounter: Payer: Medicare Other | Admitting: Family

## 2023-04-29 DIAGNOSIS — R140 Abdominal distension (gaseous): Secondary | ICD-10-CM

## 2023-04-29 DIAGNOSIS — I5043 Acute on chronic combined systolic (congestive) and diastolic (congestive) heart failure: Secondary | ICD-10-CM | POA: Diagnosis not present

## 2023-04-29 DIAGNOSIS — I509 Heart failure, unspecified: Secondary | ICD-10-CM | POA: Diagnosis not present

## 2023-04-29 DIAGNOSIS — W19XXXA Unspecified fall, initial encounter: Secondary | ICD-10-CM | POA: Diagnosis not present

## 2023-04-29 DIAGNOSIS — R601 Generalized edema: Principal | ICD-10-CM

## 2023-04-29 LAB — BASIC METABOLIC PANEL
Anion gap: 10 (ref 5–15)
BUN: 64 mg/dL — ABNORMAL HIGH (ref 8–23)
CO2: 28 mmol/L (ref 22–32)
Calcium: 8.8 mg/dL — ABNORMAL LOW (ref 8.9–10.3)
Chloride: 97 mmol/L — ABNORMAL LOW (ref 98–111)
Creatinine, Ser: 2.77 mg/dL — ABNORMAL HIGH (ref 0.61–1.24)
GFR, Estimated: 22 mL/min — ABNORMAL LOW (ref >60)
Glucose, Bld: 111 mg/dL — ABNORMAL HIGH (ref 70–99)
Potassium: 4.2 mmol/L (ref 3.5–5.1)
Sodium: 135 mmol/L (ref 135–145)

## 2023-04-29 MED ORDER — LEVOFLOXACIN 500 MG PO TABS: 500.0000 mg | ORAL_TABLET | ORAL | 0 refills | Status: AC

## 2023-04-29 MED ORDER — APIXABAN 2.5 MG PO TABS: 2.5000 mg | ORAL_TABLET | Freq: Two times a day (BID) | ORAL | 1 refills | Status: DC

## 2023-04-29 MED ORDER — TORSEMIDE 60 MG PO TABS: 60.0000 mg | ORAL_TABLET | Freq: Two times a day (BID) | ORAL | 1 refills | Status: DC

## 2023-04-29 MED ORDER — TORSEMIDE 20 MG PO TABS: 60.0000 mg | ORAL_TABLET | Freq: Two times a day (BID) | ORAL | Status: DC

## 2023-04-29 MED ORDER — IPRATROPIUM-ALBUTEROL 0.5-2.5 (3) MG/3ML IN SOLN: 3.0000 mL | RESPIRATORY_TRACT | 1 refills | Status: DC | PRN

## 2023-04-29 MED ORDER — SPIRONOLACTONE 25 MG PO TABS: 25.0000 mg | ORAL_TABLET | Freq: Every day | ORAL | 1 refills | Status: DC

## 2023-04-29 MED ORDER — DM-GUAIFENESIN ER 30-600 MG PO TB12: 1.0000 | ORAL_TABLET | Freq: Two times a day (BID) | ORAL | 0 refills | Status: DC | PRN

## 2023-04-29 NOTE — Progress Notes (Signed)
[facility] CLINIC CARDIOLOGY CONSULT NOTE       Patient ID: Taylor [name] MRN: [id number] DOB/AGE: Jul 17, 1941 82 y.o.  Admit date: 04/23/2023 Referring Physician Dr. Arnetha Courser  Primary Physician Dr. Sherrie Mustache Primary Cardiologist Dr. Darrold Junker Reason for Consultation AoCHF  HPI: Taylor [name] is an 43yoM with a PMH of PMH of chronic HFrEF (EF 20%, moderate MR 02/17/2023), CAD s/p CABG 08/2016, hx VF arrest s/p MI in 1994 s/p dual-chamber ICD (2014) and recent battery change out 02/13/2023, paroxysmal AF (Eliquis), CKD 4, AAA s/p EVAR (2008), COPD, DM2 who presented to [facility] ED 04/23/2023 after a fall. Cardiology is consulted for assistance with his HF.   Interval History:  - felt short of breath this AM, better after a duoneb treatment - no chest pain. Still with some orthopnea but overall less symptomatic than when he first presented.  - peripheral edema remains, but overall improved - eager to go home  Review of systems complete and found to be negative unless listed above     Past Medical History:  Diagnosis Date   AAA (abdominal aortic aneurysm) (HCC) 06/03/2007   [facility]; Dr. Hart Rochester   AICD (automatic cardioverter/defibrillator) present    Arrhythmia    atrial fibrillation   Barrett's esophagus    Bladder cancer (HCC)    Bradycardia    CAD (coronary artery disease)    CAP (community acquired pneumonia) 11/13/2019   CHF (congestive heart failure) (HCC)    Cluster headache    COVID-19 09/27/2021   DDD (degenerative disc disease), lumbar    Diabetes mellitus without complication (HCC)    Dyspnea    WITH EXERTION   Edema    LEFT ANKLE   Fracture of skull base (HCC) 1997   due to fall   GERD (gastroesophageal reflux disease)    Gout    History of bladder cancer 12/1995   Hyperlipidemia    Hypertension    Hypocalcemia 04/19/2020   Possibly secondary to diuretics.   Low=5.9 04/19/2020   Malignant melanoma (HCC) 12/2012   right dorsal forearm  excised   Myocardial infarction [facility])    LAST 2014   Osteoarthritis of knee    Other specified complication of vascular prosthetic devices, implants and grafts, initial encounter (HCC) 08/22/2021   Pacemaker 10/10/2006   Pancreatitis 11/22/2019   Pneumonia    2016   Pre-diabetes    Psoriasis    Rib fracture 1997   due to fall   Sleep apnea    CPAP   Stroke [facility])    Venous incompetence     Past Surgical History:  Procedure Laterality Date   ABDOMINAL AORTIC ANEURYSM REPAIR  06/03/2007   [facility]; Dr. Hart Rochester   ANGIOPLASTY  1994   MI   BLADDER TUMOR EXCISION  12/1995   CAROTID PTA/STENT INTERVENTION Left 07/23/2022   Procedure: CAROTID PTA/STENT INTERVENTION;  Surgeon: Renford Dills, MD;  Location: [facility] INVASIVE CV LAB;  Service: Cardiovascular;  Laterality: Left;   CATARACT EXTRACTION W/PHACO Left 10/22/2016   Procedure: CATARACT EXTRACTION PHACO AND INTRAOCULAR LENS PLACEMENT (IOC);  Surgeon: Galen Manila, MD;  Location: [facility] ORS;  Service: Ophthalmology;  Laterality: Left;  [place] 47.7 AP% 18.4 CDE 8.78 Fluid pack lot # [id number]   CATARACT EXTRACTION W/PHACO Right 12/10/2016   Procedure: CATARACT EXTRACTION PHACO AND INTRAOCULAR LENS PLACEMENT (IOC);  Surgeon: Galen Manila, MD;  Location: [facility] ORS;  Service: Ophthalmology;  Laterality: Right;  [place] 00:39 AP% 23.3 CDE 9.13 Fluid  pack lot # [id number]   CORONARY ANGIOPLASTY     STENTS X 5   CORONARY ARTERY BYPASS GRAFT  09/22/2006   four   ELBOW BURSA SURGERY     DUE TO GOUT   INSERT / REPLACE / REMOVE PACEMAKER     MELANOMA EXCISION  12/2012   Right forearm   PPM GENERATOR CHANGEOUT N/A 02/13/2023   Procedure: PPM GENERATOR CHANGEOUT;  Surgeon: Marcina Millard, MD;  Location: [facility] INVASIVE CV LAB;  Service: Cardiovascular;  Laterality: N/A;    Medications Prior to Admission  Medication Sig Dispense Refill Last Dose   acetaminophen (TYLENOL) 500 MG tablet Take 500 mg by mouth every 6 (six)  hours as needed for mild pain.   prn at unk   albuterol (VENTOLIN HFA) 108 (90 Base) MCG/ACT inhaler Inhale 2 puffs into the lungs every 6 (six) hours as needed for wheezing or shortness of breath. 8 g 2 prn at unk   allopurinol (ZYLOPRIM) 300 MG tablet Take 150 mg by mouth daily.   04/23/2023   apixaban (ELIQUIS) 5 MG TABS tablet Take 5 mg by mouth daily.   04/23/2023 at 0700   aspirin 81 MG EC tablet Take 81 mg by mouth daily.    04/23/2023   atorvastatin (LIPITOR) 40 MG tablet TAKE 1 TABLET(40 MG) BY MOUTH DAILY (Patient taking differently: Take 40 mg by mouth daily.) 90 tablet 1 04/23/2023   carvedilol (COREG) 6.25 MG tablet Take 1 tablet (6.25 mg total) by mouth 2 (two) times daily with a meal. 60 tablet 0 04/23/2023   empagliflozin (JARDIANCE) 10 MG TABS tablet Take 10 mg by mouth daily.   04/23/2023   isosorbide mononitrate (IMDUR) 30 MG 24 hr tablet TAKE 1/2 TABLET(15 MG) BY MOUTH DAILY (Patient taking differently: Take 15 mg by mouth daily.) 45 tablet 2 04/23/2023   Magnesium 200 MG TABS Take 1 tablet (200 mg total) by mouth in the morning and at bedtime. 60 tablet 5 04/23/2023   pantoprazole (PROTONIX) 40 MG tablet TAKE 1 TABLET BY MOUTH EVERY DAY 90 tablet 4 04/23/2023   potassium chloride SA (KLOR-CON M) 20 MEQ tablet Take 20 mEq by mouth daily.   04/23/2023   torsemide (DEMADEX) 20 MG tablet 60 mg (3 tablets) every morning and 40 mg (2 tablets) every afternoon/evening (Patient taking differently: Take 40-60 mg by mouth 2 (two) times daily. Take 3 tablets every morning and 2 tablets every evening) 90 tablet 0 04/23/2023   triamcinolone ointment (KENALOG) 0.1 % APPLY TOPICALLY TWICE DAILY AS DIRECTED 454 g 3 04/23/2023   [EXPIRED] ipratropium-albuterol (DUONEB) 0.5-2.5 (3) MG/3ML SOLN Inhale 3 mLs into the lungs every 4 (four) hours as needed (wheezing, shob).   prn at unk   metolazone (ZAROXOLYN) 5 MG tablet Take 5 mg by mouth daily. (Patient not taking: Reported on 04/23/2023)   Not Taking     Social History   Socioeconomic History   Marital status: Married    Spouse name: Taylor [name]   Number of children: 3   Years of education: Not on file   Highest education level: 12th grade  Occupational History   Occupation: retired    Comment: previously worked as a Chief Financial Officer  Tobacco Use   Smoking status: Former    Packs/day: 1.00    Years: 30.00    Additional pack years: 0.00    Total pack years: 30.00    Types: Cigarettes    Quit date: 11/26/1999    Years since quitting: 23.4  Smokeless tobacco: Never  Vaping Use   Vaping Use: Never used  Substance and Sexual Activity   Alcohol use: No   Drug use: No   Sexual activity: Not Currently  Other Topics Concern   Not on file  Social History Narrative   Lives at home with his family.  Independent at baseline.   Social Determinants of Health   Financial Resource Strain: Low Risk  (05/07/2022)   Overall Financial Resource Strain (CARDIA)    Difficulty of Paying Living Expenses: Not hard at all  Food Insecurity: No Food Insecurity (04/24/2023)   Hunger Vital Sign    Worried About Running Out of Food in the Last Year: Never true    Ran Out of Food in the Last Year: Never true  Transportation Needs: No Transportation Needs (04/24/2023)   PRAPARE - Administrator, Civil Service (Medical): No    Lack of Transportation (Non-Medical): No  Physical Activity: Insufficiently Active (04/18/2021)   Exercise Vital Sign    Days of Exercise per Week: 1 day    Minutes of Exercise per Session: 120 min  Stress: No Stress Concern Present (05/07/2022)   [employer] of Occupational Health - Occupational Stress Questionnaire    Feeling of Stress : Not at all  Social Connections: Moderately Isolated (05/07/2022)   Social Connection and Isolation Panel [NHANES]    Frequency of Communication with Friends and Family: Never    Frequency of Social Gatherings with Friends and Family: Once a week    Attends Religious Services: More  than 4 times per year    Active Member of Clubs or Organizations: No    Attends Banker Meetings: Never    Marital Status: Married  Catering manager Violence: Not At Risk (04/24/2023)   Humiliation, Afraid, Rape, and Kick questionnaire    Fear of Current or Ex-Partner: No    Emotionally Abused: No    Physically Abused: No    Sexually Abused: No    Family History  Problem Relation Age of Onset   Cancer Mother        Melanoma skin cancer   Heart attack Father 72   Cancer Father        throat cancer   Arthritis Brother       Intake/Output Summary (Last 24 hours) at 04/29/2023 0907 Last data filed at 04/29/2023 0433 Gross per 24 hour  Intake 720 ml  Output 1150 ml  Net -430 ml      Vitals:   04/28/23 2015 04/28/23 2349 04/29/23 0437 04/29/23 0839  BP: 112/81 97/61 113/77 99/76  Pulse: 64 66 66 60  Resp: 20 20 18 20   Temp: 97.6 F (36.4 C) 98.2 F (36.8 C) 97.6 F (36.4 C) (!) 97.5 F (36.4 C)  TempSrc:      SpO2: 99% 95% 100% 96%  Weight:      Height:        PHYSICAL EXAM General: chronically ill appearing elderly male, well nourished, in no acute distress sitting up with legs off bed eating breakfast HEENT:  Normocephalic and atraumatic. Neck:  No JVD.  Lungs: Normal respiratory effort on room air. Decreased breath sounds without appreciable crackles or wheezing Heart: irregular irregular with controlled rate . Normal S1 and S2 without gallops or murmurs.  Abdomen: mildy-distended appearing.  Msk: Normal strength and tone for age. Extremities:  No clubbing, cyanosis. Trace to 1+ LE edema, worse on the left Neuro: Alert and oriented X 3. Psych:  Answers  questions appropriately.   Labs: Basic Metabolic Panel: Recent Labs    04/28/23 0541 04/29/23 0605  NA 136 135  K 4.0 4.2  CL 99 97*  CO2 26 28  GLUCOSE 111* 111*  BUN 66* 64*  CREATININE 2.86* 2.77*  CALCIUM 8.7* 8.8*    Liver Function Tests: No results for input(s): "AST", "ALT",  "ALKPHOS", "BILITOT", "PROT", "ALBUMIN" in the last 72 hours.  No results for input(s): "LIPASE", "AMYLASE" in the last 72 hours. CBC: No results for input(s): "WBC", "NEUTROABS", "HGB", "HCT", "MCV", "PLT" in the last 72 hours.  Cardiac Enzymes: No results for input(s): "CKTOTAL", "CKMB", "CKMBINDEX", "TROPONINIHS" in the last 72 hours.  BNP: No results for input(s): "BNP" in the last 72 hours.  D-Dimer: No results for input(s): "DDIMER" in the last 72 hours. Hemoglobin A1C: No results for input(s): "HGBA1C" in the last 72 hours. Fasting Lipid Panel: No results for input(s): "CHOL", "HDL", "LDLCALC", "TRIG", "CHOLHDL", "LDLDIRECT" in the last 72 hours. Thyroid Function Tests: No results for input(s): "TSH", "T4TOTAL", "T3FREE", "THYROIDAB" in the last 72 hours.  Invalid input(s): "FREET3" Anemia Panel: No results for input(s): "VITAMINB12", "FOLATE", "FERRITIN", "TIBC", "IRON", "RETICCTPCT" in the last 72 hours.   Radiology: US Paracentesis  Result Date: 04/24/2023 INDICATION: Patient with past medical history of congestive heart failure and chronic kidney disease with hepatic steatosis with recurrent ascites request received for therapeutic paracentesis. EXAM: ULTRASOUND GUIDED  PARACENTESIS MEDICATIONS: Local 1% lidocaine only. COMPLICATIONS: None immediate. PROCEDURE: Informed written consent was obtained from the patient after a discussion of the risks, benefits and alternatives to treatment. A timeout was performed prior to the initiation of the procedure. Initial ultrasound scanning demonstrates a large amount of ascites within the right lower abdominal quadrant. The right lower abdomen was prepped and draped in the usual sterile fashion. 1% lidocaine was used for local anesthesia. Following this, a 19 gauge, 7-cm, Yueh catheter was introduced. An ultrasound image was saved for documentation purposes. The paracentesis was performed. The catheter was removed and a dressing was  applied. The patient tolerated the procedure well without immediate post procedural complication. Albumin ordered per primary team. FINDINGS: A total of approximately 6.8 Liters of amber colored fluid was removed. IMPRESSION: Successful ultrasound-guided paracentesis yielding 6.8 liters of peritoneal fluid. This exam was performed by Pattricia Boss PA-C, and was supervised and interpreted by Dr. Juliette Alcide. Electronically Signed   By: Olive Bass M.D.   On: 04/24/2023 10:41   DG Shoulder Right  Result Date: 04/23/2023 CLINICAL DATA:  Trauma, fall, pain EXAM: RIGHT SHOULDER - 2+ VIEW COMPARISON:  None Available. FINDINGS: No recent fracture or dislocation is seen. Deformity in the shaft of right clavicle has not changed in comparison with the study done on 04/03/2023 suggesting old healed fracture. IMPRESSION: No recent fracture or dislocation is seen in right shoulder. Electronically Signed   By: Ernie Avena M.D.   On: 04/23/2023 14:12   DG Chest Port 1 View  Result Date: 04/23/2023 CLINICAL DATA:  Trauma, fall EXAM: PORTABLE CHEST 1 VIEW COMPARISON:  04/03/2023 FINDINGS: Heart is enlarged in size. There is previous coronary bypass surgery. Pacemaker/defibrillator battery is seen in left infraclavicular region. Central pulmonary vessels are prominent. Increased interstitial markings are seen in the parahilar regions and lower lung fields. There is blunting of right lateral CP angle. There is no pneumothorax. IMPRESSION: Cardiomegaly. Increased interstitial markings are seen in the parahilar regions and lower lung fields suggesting possible mild interstitial pulmonary edema. There is no focal pulmonary  consolidation. Small right pleural effusion. Electronically Signed   By: Ernie Avena M.D.   On: 04/23/2023 14:10   CT Head Wo Contrast  Result Date: 04/23/2023 CLINICAL DATA:  Head trauma, moderate-severe; Neck trauma (Age >= 65y). EXAM: CT HEAD WITHOUT CONTRAST CT CERVICAL SPINE WITHOUT  CONTRAST TECHNIQUE: Multidetector CT imaging of the head and cervical spine was performed following the standard protocol without intravenous contrast. Multiplanar CT image reconstructions of the cervical spine were also generated. RADIATION DOSE REDUCTION: This exam was performed according to the departmental dose-optimization program which includes automated exposure control, adjustment of the mA and/or kV according to patient size and/or use of iterative reconstruction technique. COMPARISON:  Head CT 10/27/2022. FINDINGS: CT HEAD FINDINGS Brain: No acute hemorrhage. Unchanged old infarct in the medial aspect of the left occipital lobe. No new loss of gray-white differentiation. No hydrocephalus or extra-axial collection. No mass effect or midline shift. Vascular: No hyperdense vessel or unexpected calcification. Skull: No acute fracture. Old fracture deformity along left parietal and squamous temporal bones. Sinuses/Orbits: Chronic bilateral maxillary and sphenoid sinusitis. Orbits are unremarkable. Other: None. CT CERVICAL SPINE FINDINGS Alignment: Normal. Skull base and vertebrae: No acute fracture. Normal craniocervical junction. No suspicious bone lesions. Soft tissues and spinal canal: No prevertebral fluid or swelling. No visible canal hematoma. Disc levels: Multilevel cervical spondylosis, worst at C6-7, where there is at least moderate spinal canal stenosis. Upper chest: Large right pleural effusion. Other: Left carotid stent in place. IMPRESSION: 1. No acute intracranial abnormality. Old left PCA territory infarct. 2. No acute cervical spine fracture or traumatic malalignment. 3. Large right pleural effusion. Electronically Signed   By: Orvan Falconer M.D.   On: 04/23/2023 13:56   CT Cervical Spine Wo Contrast  Result Date: 04/23/2023 CLINICAL DATA:  Head trauma, moderate-severe; Neck trauma (Age >= 65y). EXAM: CT HEAD WITHOUT CONTRAST CT CERVICAL SPINE WITHOUT CONTRAST TECHNIQUE: Multidetector CT  imaging of the head and cervical spine was performed following the standard protocol without intravenous contrast. Multiplanar CT image reconstructions of the cervical spine were also generated. RADIATION DOSE REDUCTION: This exam was performed according to the departmental dose-optimization program which includes automated exposure control, adjustment of the mA and/or kV according to patient size and/or use of iterative reconstruction technique. COMPARISON:  Head CT 10/27/2022. FINDINGS: CT HEAD FINDINGS Brain: No acute hemorrhage. Unchanged old infarct in the medial aspect of the left occipital lobe. No new loss of gray-white differentiation. No hydrocephalus or extra-axial collection. No mass effect or midline shift. Vascular: No hyperdense vessel or unexpected calcification. Skull: No acute fracture. Old fracture deformity along left parietal and squamous temporal bones. Sinuses/Orbits: Chronic bilateral maxillary and sphenoid sinusitis. Orbits are unremarkable. Other: None. CT CERVICAL SPINE FINDINGS Alignment: Normal. Skull base and vertebrae: No acute fracture. Normal craniocervical junction. No suspicious bone lesions. Soft tissues and spinal canal: No prevertebral fluid or swelling. No visible canal hematoma. Disc levels: Multilevel cervical spondylosis, worst at C6-7, where there is at least moderate spinal canal stenosis. Upper chest: Large right pleural effusion. Other: Left carotid stent in place. IMPRESSION: 1. No acute intracranial abnormality. Old left PCA territory infarct. 2. No acute cervical spine fracture or traumatic malalignment. 3. Large right pleural effusion. Electronically Signed   By: Orvan Falconer M.D.   On: 04/23/2023 13:56   US Paracentesis  Result Date: 04/04/2023 INDICATION: 82 year old male with CHF exacerbation with recurrent ascites. Request received for diagnostic and therapeutic paracentesis. EXAM: ULTRASOUND GUIDED DIAGNOSTIC AND THERAPEUTIC LEFT LOWER QUADRANT  PARACENTESIS MEDICATIONS: 20 mL 1% lidocaine COMPLICATIONS: None immediate. PROCEDURE: Informed written consent was obtained from the patient after a discussion of the risks, benefits and alternatives to treatment. A timeout was performed prior to the initiation of the procedure. Initial ultrasound scanning demonstrates a moderate amount of ascites within the left lower abdominal quadrant. The right lower abdomen was prepped and draped in the usual sterile fashion. 1% lidocaine was used for local anesthesia. Following this, a 19 gauge, 7-cm, Yueh catheter was introduced. An ultrasound image was saved for documentation purposes. The paracentesis was performed. The catheter was removed and a dressing was applied. The patient tolerated the procedure well without immediate post procedural complication. FINDINGS: A total of approximately 6.6 L of clear, amber fluid was removed. Samples were sent to the laboratory as requested by the clinical team. IMPRESSION: Successful ultrasound-guided paracentesis yielding 6.6 liters of peritoneal fluid. Procedure performed by Alex Gardener, NP and supervised by Malachy Moan, MD Electronically Signed   By: Malachy Moan M.D.   On: 04/04/2023 12:51   CT ABDOMEN PELVIS WO CONTRAST  Result Date: 04/03/2023 CLINICAL DATA:  Abdominal pain EXAM: CT ABDOMEN AND PELVIS WITHOUT CONTRAST TECHNIQUE: Multidetector CT imaging of the abdomen and pelvis was performed following the standard protocol without IV contrast. RADIATION DOSE REDUCTION: This exam was performed according to the departmental dose-optimization program which includes automated exposure control, adjustment of the mA and/or kV according to patient size and/or use of iterative reconstruction technique. COMPARISON:  CT 02/25/2023 FINDINGS: Lower chest: Enlarged heart. Status post median sternotomy. Defibrillator leads along the right side of the heart. Persistent small to moderate right pleural effusion. There is some  bilateral areas of pleural thickening and some calcification. There is some bandlike changes along the lung bases once again. Areas of interstitial septal thickening. Hepatobiliary: Nodular contours of the liver. No obvious lesion on noncontrast imaging. Gallbladder is nondilated. Pancreas: Moderate atrophy of the pancreas, unchanged. Spleen: Spleen is nonenlarged. Adrenals/Urinary Tract: Adrenal glands are preserved. Severe atrophy of the left kidney. No abnormal calcification seen within either kidney nor along the course of either ureter. Preserved contours of the urinary bladder. 7 mm hyperdense lesion along the upper pole of the right kidney, unchanged from previous. Bosniak 2 lesion. No specific follow-up. Stomach/Bowel: Stomach is distended with fluid. The large bowel has a normal course and caliber with scattered stool. Left-sided colonic diverticula. Small bowel is nondilated. Third portion duodenal diverticulum. Vascular/Lymphatic: Normal caliber IVC. Aortic endograft in place once again, incompletely evaluated on this noncontrast examination. Scattered vascular calcifications otherwise. There are some small nodes identified in the retroperitoneum and pelvis which are not pathologic by size criteria and unchanged from previous. Reproductive: Heterogeneous prostate. Other: Moderate ascites once again identified, similar to previous. Anasarca. There is some fluid along the right inguinal canal. Musculoskeletal: Moderate degenerative changes of the spine and pelvis. Trace retrolisthesis of L2 on L3 and L1 on L2. Multilevel stenosis. IMPRESSION: Overall appearance is similar to previous. Moderate ascites once again identified with stranding and anasarca. Nodular liver. No bowel obstruction, free air.  Colonic diverticula. Aortic endograft incompletely evaluated without contrast. Severe atrophy of the left kidney. Enlarged heart. Postop chest with defibrillator. Stable moderate right pleural effusion. Bilateral  pleural thickening with calcification. Please correlate for any history of the environmental exposure. Electronically Signed   By: Karen Kays M.D.   On: 04/03/2023 12:31   DG Chest Portable 1 View  Result Date: 04/03/2023 CLINICAL DATA:  Shortness of breath EXAM: PORTABLE  CHEST 1 VIEW COMPARISON:  CXR 02/25/23 FINDINGS: Left-sided dual lead cardiac device with unchanged lead positioning. Status post median sternotomy and CABG. Unchanged enlarged cardiac contours. Small right and likely a layering left-sided pleural effusion. There are hazy bibasilar airspace opacity which could represent atelectasis or infection. There are prominent bilateral interstitial opacities, favored to represent pulmonary edema. No radiographically apparent displaced rib fractures IMPRESSION: 1. Cardiomegaly, mild to moderate pulmonary edema, and small bilateral pleural effusions. 2. Hazy bibasilar airspace opacities could represent atelectasis or infection. Electronically Signed   By: Lorenza Cambridge M.D.   On: 04/03/2023 11:36    ECHO  TEE 02/17/2023  1. TEE TO ASSESS MV REGURGITATION.    2. NORMAL APPEARING MV WITH FUNCTIONAL MODERATE MR (PISA radius = 0.6 cm,    VC = 0.45 cm, EROA = 0.14 cm2, Regurg Volume = 23 mL). THERE IS BLUNTING    OF THE PULMONARY VEIN S WAVE WITHOUT REVERSAL.    3. SEVERE LV AND RV SYSTOLIC DYSFUNCTION EF = 20%.    4. MILD AR, TRIVIAL PR, MODERATE TR.    5. NO LAA THROMBUS PRESENT.   TELEMETRY reviewed by me (LT) 04/29/2023 : underlying AF with occasional V-pacing rate 60s  EKG reviewed by me: AF 64 incomplete RBBB similar to priors  Data reviewed by me (LT) 04/29/2023: nephrology note, hospitalist progress note, last 24h vitals tele labs imaging I/O    Principal Problem:   Acute on chronic combined systolic and diastolic CHF (congestive heart failure) (HCC) Active Problems:   CAD S/P percutaneous coronary angioplasty   Chronic kidney disease, stage 4 (severe) (HCC)   Hypercholesteremia    Obstructive sleep apnea   History of abdominal aortic aneurysm (AAA)   History of CVA (cerebrovascular accident)   Essential (primary) hypertension   Atrial fibrillation (HCC)   Aortic atherosclerosis (HCC)   Implantable cardioverter-defibrillator (ICD) in situ   Acute pulmonary edema (HCC)   DOE (dyspnea on exertion)   Chronic obstructive pulmonary disease, unspecified COPD type (HCC)   Abdominal ascites    ASSESSMENT AND PLAN:  Jaeshaun Perret. Huizar is an 39yoM with a PMH of PMH of chronic HFrEF (EF 20%, moderate MR 02/17/2023), CAD s/p CABG 08/2016, hx VF arrest s/p MI in 1994 s/p dual-chamber ICD (2014) and recent battery change out 02/13/2023, paroxysmal AF (Eliquis), CKD 4, AAA s/p EVAR (2008), COPD, DM2 who presented to [facility] ED 04/23/2023 after a fall. Cardiology is consulted for assistance with his HF.   # mechanical fall No preceding dizziness, lightheadedness, chest pain. Fortunately no acute orthopedic injury or head injury by imaging.   # acute hypoxic respiratory failure # ? PNA vs COPD exacerbation - wheezing on exam today, on renally dosed levaquin per primary   # anasarca # acute on chronic HFrEF # moderate MR S/p LVP with IR yielding 6.8L fluid on 5/30. Suspect dietary indiscretions with multiple chronic issues (HFrEF, CKD, and cirrhosis) contributory to repeat admissions.  - give one more dose of lasix IV 80mg  this AM, then restart torsemide 60mg  PO BID starting this evening. - spironolactone 25mg  daily added 6/2 by nephrology, great UOP after this addition - continue coreg 6.25mg  BID, other GDMT with ARB/ARNI, SGLT2i limited by CKD + low normal BP  - stressed importance of adherence to low sat diet + fluid restriction. <1L per day per nephrology recommendations - defer additional cardiac diagnostics - will need close follow up with advanced HF at discharge.   # CKD 4 Monitor closely with diuresis.  -  continued improvement with Cr trend 3.24 -- 3.09 -- 2.86--2.77 and GFR 22  today  # paroxysmal AF  # hx VF arrest s/p medtronic dual-chamber ICD In rate controlled AF by admission EKG. ventricular paced rate in the 60s on telemetry this a.m.  - continue coreg and eliquis 2.5mg  BID for stroke prevention  # CAD s/p CABG  # Demand ischemia Chest pain-free.  Troponin 20, 19. C/w demand in the setting of acute on chronic CHF and chronically elevated with renal insufficiency and not ACS. - continue aspirin 81mg  daily  - continue atorva 40mg  daily - continue imdur 15mg  daily   Ok for discharge today from a cardiac perspective. Will arrange for follow up in clinic with Dr. Darrold Junker in 1-2 weeks.    This patient's plan of care was discussed and created with Dr. Darrold Junker and he is in agreement.  Signed: Rebeca Allegra , PA-C 04/29/2023, 9:07 AM [facility] Cardiology

## 2023-04-29 NOTE — TOC Transition Note (Signed)
Transition of Care [facility]) - CM/SW Discharge Note   Patient Details  Name: Taylor [name] MRN: [id number] Date of Birth: 05/24/1941  Transition of Care [facility]) CM/SW Contact:  Truddie Hidden, RN Phone Number: 04/29/2023, 11:58 AM   Clinical Narrative:    Spoke with patient regarding his discharge plan home today.  His wife will transport him home. He is agreeable to [facility] and does not have a preference.  Referral sent and accepted by Maralyn Sago from [place].  Patient advised the [facility] agency will call him directly to schedule his SOC.  TOC signing off.      Barriers to Discharge: Continued Medical Work up   Patient Goals and CMS Choice      Discharge Placement                         Discharge Plan and Services Additional resources added to the After Visit Summary for                                       Social Determinants of Health (SDOH) Interventions SDOH Screenings   Food Insecurity: No Food Insecurity (04/24/2023)  Housing: Low Risk  (04/24/2023)  Transportation Needs: No Transportation Needs (04/24/2023)  Utilities: Not At Risk (04/24/2023)  Alcohol Screen: Low Risk  (01/03/2023)  Depression (PHQ2-9): High Risk (04/10/2023)  Financial Resource Strain: Low Risk  (05/07/2022)  Physical Activity: Insufficiently Active (04/18/2021)  Social Connections: Moderately Isolated (05/07/2022)  Stress: No Stress Concern Present (05/07/2022)  Tobacco Use: Medium Risk (04/24/2023)     Readmission Risk Interventions     No data to display

## 2023-04-29 NOTE — Progress Notes (Signed)
[facility]  ROUNDING NOTE   Subjective:   [name] was admitted to [facility] on 04/23/2023 for Anasarca [R60.1] Abdominal distension [R14.0] Acute exacerbation of CHF (congestive heart failure) (HCC) [I50.9] Fall, initial encounter [W19.XXXA] Traumatic injury of head, initial encounter [S09.90XA] Acute on chronic congestive heart failure, unspecified heart failure type (HCC) [I50.9]  UOP 1.55L  Sitting at side of bed Lower extremity edema has improved  Creatinine 2.77 (2.86) (3.09) (3.24)  Objective:  Vital signs in last 24 hours:  Temp:  [97.5 F (36.4 C)-98.2 F (36.8 C)] 97.5 F (36.4 C) (06/04 0839) Pulse Rate:  [60-66] 63 (06/04 1259) Resp:  [18-20] 20 (06/04 1259) BP: (97-119)/(61-81) 119/72 (06/04 1259) SpO2:  [94 %-100 %] 100 % (06/04 1259)  Weight change:  Filed Weights   04/23/23 1234 04/28/23 1046  Weight: 88.5 kg 84.6 kg    Intake/Output: I/O last 3 completed shifts: In: 720 [P.O.:720] Out: 3050 [Urine:3050]   Intake/Output this shift:  Total I/O In: 360 [P.O.:360] Out: -   Physical Exam: General: NAD  Head: Normocephalic, atraumatic. Moist oral mucosal membranes  Eyes: Anicteric  Lungs:  Bilateral faint rales  Heart: irregular  Abdomen:  Soft, nontender,   Extremities:  ++ peripheral edema.  Neurologic: Nonfocal, moving all four extremities  Skin: No lesions        Basic Metabolic Panel: Recent Labs  Lab 04/25/23 0613 04/26/23 0612 04/27/23 0551 04/28/23 0541 04/29/23 0605  NA 136 134* 134* 136 135  K 3.8 4.2 4.0 4.0 4.2  CL 99 98 99 99 97*  CO2 28 27 27 26 28   GLUCOSE 105* 121* 113* 111* 111*  BUN 64* 66* 69* 66* 64*  CREATININE 3.20* 3.24* 3.09* 2.86* 2.77*  CALCIUM 8.5* 8.6* 8.6* 8.7* 8.8*     Liver Function Tests: Recent Labs  Lab 04/23/23 1358  AST 31  ALT 10  ALKPHOS 140*  BILITOT 2.3*  PROT 6.8  ALBUMIN 2.6*    No results for input(s): "LIPASE", "AMYLASE" in the last 168 hours. No results  for input(s): "AMMONIA" in the last 168 hours.  CBC: Recent Labs  Lab 04/23/23 1358 04/24/23 0456 04/25/23 0613  WBC 4.3 3.9* 5.7  NEUTROABS 2.6  --   --   HGB 11.8* 12.3* 11.9*  HCT 39.9 40.4 39.1  MCV 100.5* 99.0 98.7  PLT 110* 107* 102*     Cardiac Enzymes: No results for input(s): "CKTOTAL", "CKMB", "CKMBINDEX", "TROPONINI" in the last 168 hours.  BNP: Invalid input(s): "POCBNP"  CBG: No results for input(s): "GLUCAP" in the last 168 hours.  Microbiology: Results for orders placed or performed during the hospital encounter of 04/03/23  SARS Coronavirus 2 by RT PCR (hospital order, performed in [facility] hospital lab) *cepheid single result test* Anterior Nasal Swab     Status: None   Collection Time: 04/03/23 11:03 AM   Specimen: Anterior Nasal Swab  Result Value Ref Range Status   SARS Coronavirus 2 by RT PCR NEGATIVE NEGATIVE Final    Comment: (NOTE) SARS-CoV-2 target nucleic acids are NOT DETECTED.  The SARS-CoV-2 RNA is generally detectable in upper and lower respiratory specimens during the acute phase of infection. The lowest concentration of SARS-CoV-2 viral copies this assay can detect is 250 copies / mL. A negative result does not preclude SARS-CoV-2 infection and should not be used as the sole basis for treatment or other patient management decisions.  A negative result may occur with improper specimen collection / handling, submission  of specimen other than nasopharyngeal swab, presence of viral mutation(s) within the areas targeted by this assay, and inadequate number of viral copies (<250 copies / mL). A negative result must be combined with clinical observations, patient history, and epidemiological information.  Fact Sheet for Patients:   RoadLapTop.co.za  Fact Sheet for Healthcare Providers: http://kim-miller.com/  This test is not yet approved or  cleared by the [place] FDA and has been  authorized for detection and/or diagnosis of SARS-CoV-2 by FDA under an Emergency Use Authorization (EUA).  This EUA will remain in effect (meaning this test can be used) for the duration of the COVID-19 declaration under Section 564(b)(1) of the Act, 21 U.S.C. section 360bbb-3(b)(1), unless the authorization is terminated or revoked sooner.  Performed at [facility], [address] [place], [place] [address]   Blood culture (routine x 2)     Status: None   Collection Time: 04/03/23 12:11 PM   Specimen: BLOOD  Result Value Ref Range Status   Specimen Description BLOOD BLOOD RIGHT ARM  Final   Special Requests   Final    BOTTLES DRAWN AEROBIC AND ANAEROBIC Blood Culture results may not be optimal due to an inadequate volume of blood received in culture bottles   Culture   Final    NO GROWTH 5 DAYS Performed at [facility], [address] [place], [place] [address]    Report Status 04/08/2023 FINAL  Final  Blood culture (routine x 2)     Status: None   Collection Time: 04/03/23 12:16 PM   Specimen: BLOOD  Result Value Ref Range Status   Specimen Description BLOOD BLOOD LEFT HAND  Final   Special Requests   Final    BOTTLES DRAWN AEROBIC AND ANAEROBIC Blood Culture results may not be optimal due to an inadequate volume of blood received in culture bottles   Culture   Final    NO GROWTH 5 DAYS Performed at [facility], [address], [place], [place] [address]    Report Status 04/08/2023 FINAL  Final  Body fluid culture w Gram Stain     Status: None   Collection Time: 04/04/23 10:11 AM   Specimen: PATH Cytology Peritoneal fluid  Result Value Ref Range Status   Specimen Description   Final    PERITONEAL Performed at [facility], [address] [place], [place] [address]    Special Requests   Final    NONE Performed at [facility], [address] [place], [place] [address]    Gram Stain   Final    RARE WBC PRESENT,  PREDOMINANTLY MONONUCLEAR NO ORGANISMS SEEN    Culture   Final    NO GROWTH 3 DAYS Performed at [facility], [address], [place], [place] [address]    Report Status 04/08/2023 FINAL  Final    Coagulation Studies: No results for input(s): "LABPROT", "INR" in the last 72 hours.  Urinalysis: No results for input(s): "COLORURINE", "LABSPEC", "PHURINE", "GLUCOSEU", "HGBUR", "BILIRUBINUR", "KETONESUR", "PROTEINUR", "UROBILINOGEN", "NITRITE", "LEUKOCYTESUR" in the last 72 hours.  Invalid input(s): "APPERANCEUR"    Imaging: No results found.   Medications:    albumin human      allopurinol  150 mg Oral Daily   apixaban  2.5 mg Oral BID   aspirin EC  81 mg Oral Daily   atorvastatin  40 mg Oral Daily   carvedilol  6.25 mg Oral BID WC   dextromethorphan-guaiFENesin  1 tablet Oral BID  isosorbide mononitrate  15 mg Oral Daily   levofloxacin  500 mg Oral Q48H   pantoprazole  40 mg Oral Daily   potassium chloride SA  20 mEq Oral Daily   spironolactone  25 mg Oral Daily   torsemide  60 mg Oral BID   albumin human, HYDROcodone-acetaminophen, ipratropium-albuterol, senna-docusate  Assessment/ Plan:  [name] is a 82 y.o.  male with peripheral vascular disease, diabetes mellitus type II, hypertension, coronary artery disease status post CABG, hyperlipidemia, AICD, chronic congestive heart failure and gout who is admitted to [facility] on5/29/2024 for Anasarca [R60.1] Abdominal distension [R14.0] Acute exacerbation of CHF (congestive heart failure) (HCC) [I50.9] Fall, initial encounter [W19.XXXA] Traumatic injury of head, initial encounter [S09.90XA] Acute on chronic congestive heart failure, unspecified heart failure type (HCC) [I50.9]   Acute Kidney Injuyry on chronic kidney disease stage IV: baseline creatinine of 2.93, GFR of 21 on 5/32/24.  - holding empagliflozin - not on RAAS inhibition - Renal function continues to improve -Has follow up appt in our office  next week  Hypertension and acute exacerbation of chronic systolic and diastolic congestive heart failure. Status post IV furosemide and IV albumin.  - Continue spironolactone PO 25mg  daily - Agree with Torsemide 60mg  BID - fluid restriction in place  Secondary Hyperparathyroidism: not currently on activated vitamin D. Calcium acceptable   Diabetes mellitus type II with chronic kidney disease:  noninsulin dependent. Hemoglobin A1c of 6.5% on 12/24/22. - Holding empagliflozin.    LOS: 6 Taylor [name] 6/4/20243:23 PM

## 2023-04-29 NOTE — Discharge Summary (Signed)
Physician Discharge Summary   Patient: Taylor LES MRN: [id number] DOB: Apr 17, 1941  Admit date:     04/23/2023  Discharge date: 04/29/23  Discharge Physician: Arnetha Courser   PCP: Malva Limes, MD   Recommendations at discharge:  Please obtain CBC and BMP within a week Follow-up with nephrology Follow-up with primary care provider Follow-up with cardiology  Discharge Diagnoses: Principal Problem:   Acute on chronic combined systolic and diastolic CHF (congestive heart failure) (HCC) Active Problems:   Abdominal ascites   Acute pulmonary edema (HCC)   Chronic kidney disease, stage 4 (severe) (HCC)   Chronic obstructive pulmonary disease, unspecified COPD type (HCC)   Essential (primary) hypertension   Atrial fibrillation (HCC)   CAD S/P percutaneous coronary angioplasty   Obstructive sleep apnea   Hypercholesteremia   History of abdominal aortic aneurysm (AAA)   History of CVA (cerebrovascular accident)   Aortic atherosclerosis (HCC)   Implantable cardioverter-defibrillator (ICD) in situ   DOE (dyspnea on exertion)   Acute on chronic congestive heart failure (HCC)   Anasarca   Abdominal distension   [facility] Course: Mr. Siler Montemayor is a 82 year old male with combined heart failure, NYHA class III, SA node dysfunction, hyperlipidemia, CKD stage IV, OSA,, hepatic steatosis, history of abdominal ascites status post paracentesis, hypertension, atrial fibrillation, history of abdominal aortic aneurysm status post endovascular repair in 2008, CAD status post CABG and PCI, old left PCA territory, who presents to the emergency department for chief concerns of weight gain, shortness of breath, and falling at [facility] [employer].  Vitals in the ED showed temperature 98, respiration rate of 18, heart rate of 62, blood pressure 168/83, SpO2 of 96% on room air.  Serum sodium is 140, potassium 3.6, chloride 101, bicarb 29, BUN of 62, serum creatinine of 3.17, EGFR of 19,  nonfasting blood glucose 91, WBC 4.3, hemoglobin 11.8, platelets of 110.  Alk phos was elevated at 140.  Albumin level was 2.6.  BNP was elevated 1516.7, high sensitive troponin was 20.  ED treatment: Furosemide 80 mg IV one-time dose  5/30: Vital stable.  Slight worsening of creatinine to 3.33 which remained around his baseline.  S/p paracentesis with removal of 6.8 L of amber-colored fluid. Worsening cough, procalcitonin at 0.25-started p.o. levofloxacin. Prior echocardiogram with EF of less than 20%, his cardiologist was also consulted.  Established patient with [facility] clinic. 5/31: Hemodynamically stable and clinically improving.  Cardiology would like to continue with IV diuresis.  Renal function stable. 6/1: Vital stable.  Slight worsening of creatinine and BUN today.  Holding today's Lasix. Started wheezing-added DuoNeb. 6/2: Hemodynamically stable.  Creatinine with a small improvement to 3.09, nephrology is planning to restart Lasix at 80 mg twice daily along with spironolactone.  Appreciate nephrology help with diuresis. 6/3: Good UOP with current regimen.  Improving renal function.  Wants to go home, ordered PT, will continue with IV diuresis today and switching to torsemide 60 mg twice daily from tomorrow and discharge.  Patient wants to continue with cardiac rehab and needs reorders on discharge, message sent to cardiology. 6/4: Patient remained stable with improving renal function and good UOP.  He received 80 mg of IV Lasix in the morning and is being discharged on torsemide 60 mg twice daily.  Was also given 2 more doses of Levaquin to complete the course.  Patient was advised to stop taking Jardiance and decrease the dose of Eliquis. Patient will resume his cardiac rehab on discharge. Home health  services were ordered.  Patient will continue on current medication and need to have a close follow-up with his providers for further recommendations.  Assessment and Plan: * Acute  on chronic combined systolic and diastolic CHF (congestive heart failure) (HCC) Patient had complete echo on 02/17/2023: Severe LV and RV systolic dysfunction, estimated ejection fraction 20%, mild AR, trivial PR, moderate TR Status post furosemide 80 mg IV one-time dose per EDP Slight improvement in GFR to 20 today, from 19.  UOP recorded is 425 -Nephrology restarted Lasix at 80 mg twice daily, also added spironolactone, with good UOP and improving renal function-we will continue today Strict I's and O's Daily weight and BMP Cardiology consult-  Abdominal ascites Presumed secondary to acute on chronic combined heart failure exacerbation in setting of hepatic steatosis S/p ultrasound-guided paracentesis with removal of 6.5 L of -Continue with IV diuresis  Acute pulmonary edema (HCC) Presumed secondary to heart failure exacerbation,  -Continue with IV diuresis  Chronic kidney disease, stage 4 (severe) (HCC) Started improving with diuresis. Nephrology consult to help with appropriate diuresis -Monitor renal function as patient is being diuresed -Avoid nephrotoxins  Chronic obstructive pulmonary disease, unspecified COPD type (HCC) Worsening cough for the past couple of week, Calcitonin added 0.25 Started him on levofloxacin Home DuoNebs every 4 hours as needed for wheezing and shortness of breath resumed  Essential (primary) hypertension Home Coreg 6.25 mg p.o. twice daily with meals resumed, Imdur 50 mg daily resumed  Atrial fibrillation (HCC) Home Eliquis not resumed on admission due to recent fall with multiple skin lacerations, ecchymosis on the right arm and shoulder and ultrasound-guided paracentesis ordered on admission. -Restarting home Eliquis as hemoglobin seems stable   CAD S/P percutaneous coronary angioplasty No chest pain. -Continue with home aspirin, carvedilol and statin  Obstructive sleep apnea -CPAP at night  Hypercholesteremia Home atorvastatin 40 mg daily  resumed  Acute on chronic congestive heart failure (HCC) Patient had complete echo on 02/17/2023 which was read as: Severe LV and RV systolic dysfunction, estimated ejection fraction is 20%.  Mild AR, trivial PR, moderate TR. Strict I's and O's Status post furosemide 80 mg IV one-time dose per EDP I have ordered furosemide 60 mg IV twice daily for 5/30/104 Admit to telemetry cardiac, inpatient   DOE (dyspnea on exertion) Presumed secondary to pulmonary edema in setting of combined acute on chronic heart failure exacerbation   Consultants: Cardiology.  Nephrology Procedures performed: None Disposition: Home health Diet recommendation:  Discharge Diet Orders (From admission, onward)     Start     Ordered   04/29/23 0000  Diet - low sodium heart healthy        04/29/23 1139           Cardiac diet DISCHARGE MEDICATION: Allergies as of 04/29/2023       Reactions   Amlodipine Besylate Swelling   Had a reaction when taking with colcrys    Rocephin [ceftriaxone]    unknown   Crestor [rosuvastatin]    Muscle cramps and pain. Tolerates atorvastatin        Medication List     STOP taking these medications    Jardiance 10 MG Tabs tablet Generic drug: empagliflozin   metolazone 5 MG tablet Commonly known as: ZAROXOLYN       TAKE these medications    acetaminophen 500 MG tablet Commonly known as: TYLENOL Take 500 mg by mouth every 6 (six) hours as needed for mild pain.   albuterol 108 (90 Base) MCG/ACT  inhaler Commonly known as: VENTOLIN HFA Inhale 2 puffs into the lungs every 6 (six) hours as needed for wheezing or shortness of breath.   allopurinol 300 MG tablet Commonly known as: ZYLOPRIM Take 150 mg by mouth daily.   apixaban 2.5 MG Tabs tablet Commonly known as: ELIQUIS Take 1 tablet (2.5 mg total) by mouth 2 (two) times daily. What changed:  medication strength how much to take when to take this   aspirin EC 81 MG tablet Take 81 mg by mouth  daily.   atorvastatin 40 MG tablet Commonly known as: LIPITOR TAKE 1 TABLET(40 MG) BY MOUTH DAILY What changed: See the new instructions.   carvedilol 6.25 MG tablet Commonly known as: COREG Take 1 tablet (6.25 mg total) by mouth 2 (two) times daily with a meal.   dextromethorphan-guaiFENesin 30-600 MG 12hr tablet Commonly known as: MUCINEX DM Take 1 tablet by mouth 2 (two) times daily as needed for cough.   ipratropium-albuterol 0.5-2.5 (3) MG/3ML Soln Commonly known as: DUONEB Inhale 3 mLs into the lungs every 4 (four) hours as needed (wheezing, shob).   isosorbide mononitrate 30 MG 24 hr tablet Commonly known as: IMDUR TAKE 1/2 TABLET(15 MG) BY MOUTH DAILY What changed: See the new instructions.   levofloxacin 500 MG tablet Commonly known as: LEVAQUIN Take 1 tablet (500 mg total) by mouth every other day for 2 doses. Start taking on: April 30, 2023   Magnesium 200 MG Tabs Take 1 tablet (200 mg total) by mouth in the morning and at bedtime.   pantoprazole 40 MG tablet Commonly known as: PROTONIX TAKE 1 TABLET BY MOUTH EVERY DAY   potassium chloride SA 20 MEQ tablet Commonly known as: KLOR-CON M Take 20 mEq by mouth daily.   spironolactone 25 MG tablet Commonly known as: ALDACTONE Take 1 tablet (25 mg total) by mouth daily. Start taking on: April 30, 2023   Torsemide 60 MG Tabs Take 60 mg by mouth 2 (two) times daily. What changed:  medication strength how much to take how to take this when to take this additional instructions   triamcinolone ointment 0.1 % Commonly known as: KENALOG APPLY TOPICALLY TWICE DAILY AS DIRECTED               Discharge Care Instructions  (From admission, onward)           Start     Ordered   04/29/23 0000  If the dressing is still on your incision site when you go home, remove it on the third day after your surgery date. Remove dressing if it begins to fall off, or if it is dirty or damaged before the third day.         04/29/23 1139            Follow-up Information     Paraschos, Lyn Hollingshead, MD. Go in 1 week(s).   Specialty: Cardiology Why: Appointment on Thursday, 05/22/2023 at 2:30pm. Contact information: [address] [facility] [place] [address] [phone]         Malva Limes, MD. Schedule an appointment as soon as possible for a visit in 1 week(s).   Specialty: Family Medicine Contact information: [address] [place] [address] [phone]         Lorain Childes, MD. Go on 05/08/2023.   Specialty: Nephrology Why: 2 PM Contact information: [address] [place] [address] [phone]  Discharge Exam: Filed Weights   04/23/23 1234 04/28/23 1046  Weight: 88.5 kg 84.6 kg   General.  Frail elderly man, in no acute distress. Pulmonary.  Lungs clear bilaterally, normal respiratory effort. CV.  Regular rate and rhythm, no JVD, rub or murmur. Abdomen.  Soft, nontender, nondistended, BS positive. CNS.  Alert and oriented .  No focal neurologic deficit. Extremities.  1+ LE edema, no cyanosis, pulses intact and symmetrical. Psychiatry.  Judgment and insight appears normal.   Condition at discharge: stable  The results of significant diagnostics from this hospitalization (including imaging, microbiology, ancillary and laboratory) are listed below for reference.   Imaging Studies: US Paracentesis  Result Date: 04/24/2023 INDICATION: Patient with past medical history of congestive heart failure and chronic kidney disease with hepatic steatosis with recurrent ascites request received for therapeutic paracentesis. EXAM: ULTRASOUND GUIDED  PARACENTESIS MEDICATIONS: Local 1% lidocaine only. COMPLICATIONS: None immediate. PROCEDURE: Informed written consent was obtained from the patient after a discussion of the risks, benefits and alternatives to treatment. A timeout was performed  prior to the initiation of the procedure. Initial ultrasound scanning demonstrates a large amount of ascites within the right lower abdominal quadrant. The right lower abdomen was prepped and draped in the usual sterile fashion. 1% lidocaine was used for local anesthesia. Following this, a 19 gauge, 7-cm, Yueh catheter was introduced. An ultrasound image was saved for documentation purposes. The paracentesis was performed. The catheter was removed and a dressing was applied. The patient tolerated the procedure well without immediate post procedural complication. Albumin ordered per primary team. FINDINGS: A total of approximately 6.8 Liters of amber colored fluid was removed. IMPRESSION: Successful ultrasound-guided paracentesis yielding 6.8 liters of peritoneal fluid. This exam was performed by Pattricia Boss PA-C, and was supervised and interpreted by Dr. Juliette Alcide. Electronically Signed   By: Olive Bass BlevinsD.   On: 04/24/2023 10:41   DG Shoulder Right  Result Date: 04/23/2023 CLINICAL DATA:  Trauma, fall, pain EXAM: RIGHT SHOULDER - 2+ VIEW COMPARISON:  None Available. FINDINGS: No recent fracture or dislocation is seen. Deformity in the shaft of right clavicle has not changed in comparison with the study done on 04/03/2023 suggesting old healed fracture. IMPRESSION: No recent fracture or dislocation is seen in right shoulder. Electronically Signed   By: Ernie Avena BlevinsD.   On: 04/23/2023 14:12   DG Chest Port 1 View  Result Date: 04/23/2023 CLINICAL DATA:  Trauma, fall EXAM: PORTABLE CHEST 1 VIEW COMPARISON:  04/03/2023 FINDINGS: Heart is enlarged in size. There is previous coronary bypass surgery. Pacemaker/defibrillator battery is seen in left infraclavicular region. Central pulmonary vessels are prominent. Increased interstitial markings are seen in the parahilar regions and lower lung fields. There is blunting of right lateral CP angle. There is no pneumothorax. IMPRESSION: Cardiomegaly.  Increased interstitial markings are seen in the parahilar regions and lower lung fields suggesting possible mild interstitial pulmonary edema. There is no focal pulmonary consolidation. Small right pleural effusion. Electronically Signed   By: Ernie Avena BlevinsD.   On: 04/23/2023 14:10   CT Head Wo Contrast  Result Date: 04/23/2023 CLINICAL DATA:  Head trauma, moderate-severe; Neck trauma (Age >= 65y). EXAM: CT HEAD WITHOUT CONTRAST CT CERVICAL SPINE WITHOUT CONTRAST TECHNIQUE: Multidetector CT imaging of the head and cervical spine was performed following the standard protocol without intravenous contrast. Multiplanar CT image reconstructions of the cervical spine were also generated. RADIATION DOSE REDUCTION: This exam was performed according to the departmental dose-optimization program which includes automated  exposure control, adjustment of the mA and/or kV according to patient size and/or use of iterative reconstruction technique. COMPARISON:  Head CT 10/27/2022. FINDINGS: CT HEAD FINDINGS Brain: No acute hemorrhage. Unchanged old infarct in the medial aspect of the left occipital lobe. No new loss of gray-white differentiation. No hydrocephalus or extra-axial collection. No mass effect or midline shift. Vascular: No hyperdense vessel or unexpected calcification. Skull: No acute fracture. Old fracture deformity along left parietal and squamous temporal bones. Sinuses/Orbits: Chronic bilateral maxillary and sphenoid sinusitis. Orbits are unremarkable. Other: None. CT CERVICAL SPINE FINDINGS Alignment: Normal. Skull base and vertebrae: No acute fracture. Normal craniocervical junction. No suspicious bone lesions. Soft tissues and spinal canal: No prevertebral fluid or swelling. No visible canal hematoma. Disc levels: Multilevel cervical spondylosis, worst at C6-7, where there is at least moderate spinal canal stenosis. Upper chest: Large right pleural effusion. Other: Left carotid stent in place.  IMPRESSION: 1. No acute intracranial abnormality. Old left PCA territory infarct. 2. No acute cervical spine fracture or traumatic malalignment. 3. Large right pleural effusion. Electronically Signed   By: Orvan Falconer BlevinsD.   On: 04/23/2023 13:56   CT Cervical Spine Wo Contrast  Result Date: 04/23/2023 CLINICAL DATA:  Head trauma, moderate-severe; Neck trauma (Age >= 65y). EXAM: CT HEAD WITHOUT CONTRAST CT CERVICAL SPINE WITHOUT CONTRAST TECHNIQUE: Multidetector CT imaging of the head and cervical spine was performed following the standard protocol without intravenous contrast. Multiplanar CT image reconstructions of the cervical spine were also generated. RADIATION DOSE REDUCTION: This exam was performed according to the departmental dose-optimization program which includes automated exposure control, adjustment of the mA and/or kV according to patient size and/or use of iterative reconstruction technique. COMPARISON:  Head CT 10/27/2022. FINDINGS: CT HEAD FINDINGS Brain: No acute hemorrhage. Unchanged old infarct in the medial aspect of the left occipital lobe. No new loss of gray-white differentiation. No hydrocephalus or extra-axial collection. No mass effect or midline shift. Vascular: No hyperdense vessel or unexpected calcification. Skull: No acute fracture. Old fracture deformity along left parietal and squamous temporal bones. Sinuses/Orbits: Chronic bilateral maxillary and sphenoid sinusitis. Orbits are unremarkable. Other: None. CT CERVICAL SPINE FINDINGS Alignment: Normal. Skull base and vertebrae: No acute fracture. Normal craniocervical junction. No suspicious bone lesions. Soft tissues and spinal canal: No prevertebral fluid or swelling. No visible canal hematoma. Disc levels: Multilevel cervical spondylosis, worst at C6-7, where there is at least moderate spinal canal stenosis. Upper chest: Large right pleural effusion. Other: Left carotid stent in place. IMPRESSION: 1. No acute intracranial  abnormality. Old left PCA territory infarct. 2. No acute cervical spine fracture or traumatic malalignment. 3. Large right pleural effusion. Electronically Signed   By: Orvan Falconer BlevinsD.   On: 04/23/2023 13:56   US Paracentesis  Result Date: 04/04/2023 INDICATION: 82 year old male with CHF exacerbation with recurrent ascites. Request received for diagnostic and therapeutic paracentesis. EXAM: ULTRASOUND GUIDED DIAGNOSTIC AND THERAPEUTIC LEFT LOWER QUADRANT PARACENTESIS MEDICATIONS: 20 mL 1% lidocaine COMPLICATIONS: None immediate. PROCEDURE: Informed written consent was obtained from the patient after a discussion of the risks, benefits and alternatives to treatment. A timeout was performed prior to the initiation of the procedure. Initial ultrasound scanning demonstrates a moderate amount of ascites within the left lower abdominal quadrant. The right lower abdomen was prepped and draped in the usual sterile fashion. 1% lidocaine was used for local anesthesia. Following this, a 19 gauge, 7-cm, Yueh catheter was introduced. An ultrasound image was saved for documentation purposes. The paracentesis was performed.  The catheter was removed and a dressing was applied. The patient tolerated the procedure well without immediate post procedural complication. FINDINGS: A total of approximately 6.6 L of clear, amber fluid was removed. Samples were sent to the laboratory as requested by the clinical team. IMPRESSION: Successful ultrasound-guided paracentesis yielding 6.6 liters of peritoneal fluid. Procedure performed by Alex Gardener, NP and supervised by Malachy Moan, MD Electronically Signed   By: Malachy Moan BlevinsD.   On: 04/04/2023 12:51   CT ABDOMEN PELVIS WO CONTRAST  Result Date: 04/03/2023 CLINICAL DATA:  Abdominal pain EXAM: CT ABDOMEN AND PELVIS WITHOUT CONTRAST TECHNIQUE: Multidetector CT imaging of the abdomen and pelvis was performed following the standard protocol without IV contrast. RADIATION  DOSE REDUCTION: This exam was performed according to the departmental dose-optimization program which includes automated exposure control, adjustment of the mA and/or kV according to patient size and/or use of iterative reconstruction technique. COMPARISON:  CT 02/25/2023 FINDINGS: Lower chest: Enlarged heart. Status post median sternotomy. Defibrillator leads along the right side of the heart. Persistent small to moderate right pleural effusion. There is some bilateral areas of pleural thickening and some calcification. There is some bandlike changes along the lung bases once again. Areas of interstitial septal thickening. Hepatobiliary: Nodular contours of the liver. No obvious lesion on noncontrast imaging. Gallbladder is nondilated. Pancreas: Moderate atrophy of the pancreas, unchanged. Spleen: Spleen is nonenlarged. Adrenals/Urinary Tract: Adrenal glands are preserved. Severe atrophy of the left kidney. No abnormal calcification seen within either kidney nor along the course of either ureter. Preserved contours of the urinary bladder. 7 mm hyperdense lesion along the upper pole of the right kidney, unchanged from previous. Bosniak 2 lesion. No specific follow-up. Stomach/Bowel: Stomach is distended with fluid. The large bowel has a normal course and caliber with scattered stool. Left-sided colonic diverticula. Small bowel is nondilated. Third portion duodenal diverticulum. Vascular/Lymphatic: Normal caliber IVC. Aortic endograft in place once again, incompletely evaluated on this noncontrast examination. Scattered vascular calcifications otherwise. There are some small nodes identified in the retroperitoneum and pelvis which are not pathologic by size criteria and unchanged from previous. Reproductive: Heterogeneous prostate. Other: Moderate ascites once again identified, similar to previous. Anasarca. There is some fluid along the right inguinal canal. Musculoskeletal: Moderate degenerative changes of the  spine and pelvis. Trace retrolisthesis of L2 on L3 and L1 on L2. Multilevel stenosis. IMPRESSION: Overall appearance is similar to previous. Moderate ascites once again identified with stranding and anasarca. Nodular liver. No bowel obstruction, free air.  Colonic diverticula. Aortic endograft incompletely evaluated without contrast. Severe atrophy of the left kidney. Enlarged heart. Postop chest with defibrillator. Stable moderate right pleural effusion. Bilateral pleural thickening with calcification. Please correlate for any history of the environmental exposure. Electronically Signed   By: Karen Kays BlevinsD.   On: 04/03/2023 12:31   DG Chest Portable 1 View  Result Date: 04/03/2023 CLINICAL DATA:  Shortness of breath EXAM: PORTABLE CHEST 1 VIEW COMPARISON:  CXR 02/25/23 FINDINGS: Left-sided dual lead cardiac device with unchanged lead positioning. Status post median sternotomy and CABG. Unchanged enlarged cardiac contours. Small right and likely a layering left-sided pleural effusion. There are hazy bibasilar airspace opacity which could represent atelectasis or infection. There are prominent bilateral interstitial opacities, favored to represent pulmonary edema. No radiographically apparent displaced rib fractures IMPRESSION: 1. Cardiomegaly, mild to moderate pulmonary edema, and small bilateral pleural effusions. 2. Hazy bibasilar airspace opacities could represent atelectasis or infection. Electronically Signed   By: Elige Radon.D.  On: 04/03/2023 11:36    Microbiology: Results for orders placed or performed during the hospital encounter of 04/03/23  SARS Coronavirus 2 by RT PCR (hospital order, performed in [facility] hospital lab) *cepheid single result test* Anterior Nasal Swab     Status: None   Collection Time: 04/03/23 11:03 AM   Specimen: Anterior Nasal Swab  Result Value Ref Range Status   SARS Coronavirus 2 by RT PCR NEGATIVE NEGATIVE Final    Comment: (NOTE) SARS-CoV-2 target nucleic  acids are NOT DETECTED.  The SARS-CoV-2 RNA is generally detectable in upper and lower respiratory specimens during the acute phase of infection. The lowest concentration of SARS-CoV-2 viral copies this assay can detect is 250 copies / mL. A negative result does not preclude SARS-CoV-2 infection and should not be used as the sole basis for treatment or other patient management decisions.  A negative result may occur with improper specimen collection / handling, submission of specimen other than nasopharyngeal swab, presence of viral mutation(s) within the areas targeted by this assay, and inadequate number of viral copies (<250 copies / mL). A negative result must be combined with clinical observations, patient history, and epidemiological information.  Fact Sheet for Patients:   RoadLapTop.co.za  Fact Sheet for Healthcare Providers: http://kim-miller.com/  This test is not yet approved or  cleared by the [place] FDA and has been authorized for detection and/or diagnosis of SARS-CoV-2 by FDA under an Emergency Use Authorization (EUA).  This EUA will remain in effect (meaning this test can be used) for the duration of the COVID-19 declaration under Section 564(b)(1) of the Act, 21 U.S.C. section 360bbb-3(b)(1), unless the authorization is terminated or revoked sooner.  Performed at [facility], [address] [place], [place] [address]   Blood culture (routine x 2)     Status: None   Collection Time: 04/03/23 12:11 PM   Specimen: BLOOD  Result Value Ref Range Status   Specimen Description BLOOD BLOOD RIGHT ARM  Final   Special Requests   Final    BOTTLES DRAWN AEROBIC AND ANAEROBIC Blood Culture results may not be optimal due to an inadequate volume of blood received in culture bottles   Culture   Final    NO GROWTH 5 DAYS Performed at [facility], [address] [place], [place] [address]    Report  Status 04/08/2023 FINAL  Final  Blood culture (routine x 2)     Status: None   Collection Time: 04/03/23 12:16 PM   Specimen: BLOOD  Result Value Ref Range Status   Specimen Description BLOOD BLOOD LEFT HAND  Final   Special Requests   Final    BOTTLES DRAWN AEROBIC AND ANAEROBIC Blood Culture results may not be optimal due to an inadequate volume of blood received in culture bottles   Culture   Final    NO GROWTH 5 DAYS Performed at [name], [address], [place], [place] [address]    Report Status 04/08/2023 FINAL  Final  Body fluid culture w Gram Stain     Status: None   Collection Time: 04/04/23 10:11 AM   Specimen: PATH Cytology Peritoneal fluid  Result Value Ref Range Status   Specimen Description   Final    PERITONEAL Performed at [facility], [address] [place], [place] [address]    Special Requests   Final    NONE Performed at [facility], [address], [place], [place] [address]    Gram  Stain   Final    RARE WBC PRESENT, PREDOMINANTLY MONONUCLEAR NO ORGANISMS SEEN    Culture   Final    NO GROWTH 3 DAYS Performed at [facility], [address], [place], [place] [address]    Report Status 04/08/2023 FINAL  Final    Labs: CBC: Recent Labs  Lab 04/23/23 1358 04/24/23 0456 04/25/23 0613  WBC 4.3 3.9* 5.7  NEUTROABS 2.6  --   --   HGB 11.8* 12.3* 11.9*  HCT 39.9 40.4 39.1  MCV 100.5* 99.0 98.7  PLT 110* 107* 102*   Basic Metabolic Panel: Recent Labs  Lab 04/25/23 0613 04/26/23 0612 04/27/23 0551 04/28/23 0541 04/29/23 0605  NA 136 134* 134* 136 135  K 3.8 4.2 4.0 4.0 4.2  CL 99 98 99 99 97*  CO2 28 27 27 26 28   GLUCOSE 105* 121* 113* 111* 111*  BUN 64* 66* 69* 66* 64*  CREATININE 3.20* 3.24* 3.09* 2.86* 2.77*  CALCIUM 8.5* 8.6* 8.6* 8.7* 8.8*   Liver Function Tests: Recent Labs  Lab 04/23/23 1358  AST 31  ALT 10  ALKPHOS 140*  BILITOT 2.3*  PROT 6.8  ALBUMIN 2.6*   CBG: No results  for input(s): "GLUCAP" in the last 168 hours.  Discharge time spent: greater than 30 minutes.  This record has been created using Conservation officer, historic buildings. Errors have been sought and corrected,but may not always be located. Such creation errors do not reflect on the standard of care.   Signed: Arnetha Courser, MD [facility] 04/29/2023

## 2023-04-29 NOTE — Evaluation (Signed)
Physical Therapy Evaluation Patient Details Name: Taylor [name] MRN: [id number] DOB: 21-Jun-1941 Today's Date: 04/29/2023  History of Present Illness  Taylor [name] is an 81yoM who comes [facility] on 5/29 after fall at [employer]. Pt admitted in A/C sCHF exacerbation, ascites. Paracentesis done 5/30 yielding 6.8L. PMH: CHF, A-fib on Eliquis, AICD in place, type II diabetic, hypertension, AAA, ascites from decompensated liver cirrhosis, COPD. Pt was enrolled in cardiac rehab here at [facility] when admitted.  Clinical Impression  Pt seated EOB with visitor in room, he is agreeable to PT evaluation. Pt AMB limited community distances around unit at less than household distance speeds. VSS during bout. 1 significant LOB. 1 60sec episode of lightheadedness. Pt feels close to 75% of his baseline, but not moving well enough to return to cardiac rehab immediately. Would benefit from HHPT briefly for rapid recovery of lost function.    Recommendations for follow up therapy are one component of a multi-disciplinary discharge planning process, led by the attending physician.  Recommendations may be updated based on patient status, additional functional criteria and insurance authorization.  Follow Up Recommendations       Assistance Recommended at Discharge Intermittent Supervision/Assistance  Patient can return home with the following  A little help with walking and/or transfers;Help with stairs or ramp for entrance;Assist for transportation;A little help with bathing/dressing/bathroom    Equipment Recommendations None recommended by PT  Recommendations for Other Services       Functional Status Assessment Patient has had a recent decline in their functional status and demonstrates the ability to make significant improvements in function in a reasonable and predictable amount of time.     Precautions / Restrictions Precautions Precautions: Fall Restrictions Weight Bearing Restrictions: No       Mobility  Bed Mobility Overal bed mobility: Modified Independent                  Transfers Overall transfer level: Modified independent Equipment used: None Transfers: Sit to/from Stand                  Ambulation/Gait Ambulation/Gait assistance: Min guard Gait Distance (Feet): 280 Feet Assistive device: None   Gait velocity: 0.79m/s     General Gait Details: slow but steady; 1 LOB, 60sec dizziness/lighteheadedness at 248ft.  Stairs            Wheelchair Mobility    Modified Rankin (Stroke Patients Only)       Balance Overall balance assessment: History of Falls, Mild deficits observed, not formally tested                                           Pertinent Vitals/Pain Pain Assessment Pain Assessment: No/denies pain    Home Living Family/patient expects to be discharged to:: Private residence Living Arrangements: Spouse/significant other Available Help at Discharge: Family;Available 24 hours/day Type of Home: House Home Access: Stairs to enter Entrance Stairs-Rails: Right;Left;Can reach both Entrance Stairs-Number of Steps: 3   Home Layout: One level Home Equipment: Agricultural consultant (2 wheels) Additional Comments: Pt very active, rides bike and exercises frequently; currently enrolled in cardiac rehab.    Prior Function Prior Level of Function : Independent/Modified Independent;Driving             Mobility Comments: Ind amb community distances without an AD, rides indoor stationary bike and outdoor bike for exercise, no fall  history. 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       Hand Dominance   Dominant Hand: Right    Extremity/Trunk Assessment                Communication      Cognition Arousal/Alertness: Awake/alert Behavior During Therapy: WFL for tasks assessed/performed Overall Cognitive Status: Within Functional Limits for tasks assessed                                           General Comments      Exercises     Assessment/Plan    PT Assessment Patient needs continued PT services  PT Problem List Decreased strength;Decreased activity tolerance;Decreased balance;Decreased mobility       PT Treatment Interventions DME instruction;Gait training;Stair training;Functional mobility training;Therapeutic activities;Therapeutic exercise;Balance training;Patient/family education    PT Goals (Current goals can be found in the Care Plan section)  Acute Rehab PT Goals Patient Stated Goal: regain strength, mobility tolerance, PT Goal Formulation: With patient Time For Goal Achievement: 05/13/23 Potential to Achieve Goals: Fair    Frequency Min 2X/week     Co-evaluation               AM-PAC PT "6 Clicks" Mobility  Outcome Measure Help needed turning from your back to your side while in a flat bed without using bedrails?: None Help needed moving from lying on your back to sitting on the side of a flat bed without using bedrails?: None Help needed moving to and from a bed to a chair (including a wheelchair)?: A Little Help needed standing up from a chair using your arms (e.g., wheelchair or bedside chair)?: A Little Help needed to walk in hospital room?: A Little Help needed climbing 3-5 steps with a railing? : A Little 6 Click Score: 20    End of Session Equipment Utilized During Treatment: Gait belt Activity Tolerance: Patient tolerated treatment well;No increased pain Patient left: in bed;with call bell/phone within reach Nurse Communication: Mobility status PT Visit Diagnosis: Muscle weakness (generalized) (M62.81);Difficulty in walking, not elsewhere classified (R26.2)    Time: 1010-1027 PT Time Calculation (min) (ACUTE ONLY): 17 min   Charges:   PT Evaluation $PT Eval Moderate Complexity: 1 Mod PT Treatments $Therapeutic Activity: 8-22 mins       12:54 PM, 04/29/23 Rosamaria Lints, PT, DPT Physical Therapist -  [facility]  [phone] (ASCOM)    Basil Buffin C 04/29/2023, 12:52 PM

## 2023-04-30 ENCOUNTER — Encounter: Payer: Medicare Other | Admitting: *Deleted

## 2023-04-30 ENCOUNTER — Telehealth: Payer: Self-pay | Admitting: *Deleted

## 2023-04-30 DIAGNOSIS — I69898 Other sequelae of other cerebrovascular disease: Secondary | ICD-10-CM | POA: Diagnosis not present

## 2023-04-30 DIAGNOSIS — J449 Chronic obstructive pulmonary disease, unspecified: Secondary | ICD-10-CM | POA: Diagnosis not present

## 2023-04-30 DIAGNOSIS — N184 Chronic kidney disease, stage 4 (severe): Secondary | ICD-10-CM | POA: Diagnosis not present

## 2023-04-30 DIAGNOSIS — I501 Left ventricular failure: Secondary | ICD-10-CM | POA: Diagnosis not present

## 2023-04-30 DIAGNOSIS — I110 Hypertensive heart disease with heart failure: Secondary | ICD-10-CM | POA: Diagnosis not present

## 2023-04-30 DIAGNOSIS — E1122 Type 2 diabetes mellitus with diabetic chronic kidney disease: Secondary | ICD-10-CM | POA: Diagnosis not present

## 2023-04-30 NOTE — Transitions of Care (Post Inpatient/ED Visit) (Signed)
04/30/2023  Name: Taylor [name] MRN: [id number] DOB: [birth year]  Today's TOC FU Call Status: Today's TOC FU Call Status:: Successful TOC FU Call Competed  Transition Care Management Follow-up Telephone Call Date of Discharge: 04/30/23 Discharge Facility: [facility]) Type of Discharge: Inpatient Admission Primary Inpatient Discharge Diagnosis:: Acute on Chronic Combined Systolic and Diastolic CHF How have you been since you were released from the hospital?: Better Any questions or concerns?: No  Items Reviewed: Did you receive and understand the discharge instructions provided?: Yes Medications obtained,verified, and reconciled?: Yes (Medications Reviewed) Any new allergies since your discharge?: No Dietary orders reviewed?: No Do you have support at home?: Yes People in Home: spouse Name of Support/Comfort Primary Source: [name] and sons live next door  Medications Reviewed Today: Medications Reviewed Today     Reviewed by Luella Cook, RN (Case Manager) on 04/30/23 at 1359  Med List Status: <None>   Medication Order Taking? Sig Documenting Provider Last Dose Status Informant  acetaminophen (TYLENOL) 500 MG tablet [id number] Yes Take 500 mg by mouth every 6 (six) hours as needed for mild pain. [provider] Taking Active   albuterol (VENTOLIN HFA) 108 (90 Base) MCG/ACT inhaler [id number] Yes Inhale 2 puffs into the lungs every 6 (six) hours as needed for wheezing or shortness of breath. Malva Limes, MD Taking Active Self  allopurinol (ZYLOPRIM) 300 MG tablet [id number] Yes Take 150 mg by mouth daily. [provider] Taking Active Self  apixaban (ELIQUIS) 2.5 MG TABS tablet [id number] Yes Take 1 tablet (2.5 mg total) by mouth 2 (two) times daily. Arnetha Courser, MD Taking Active   aspirin 81 MG EC tablet [id number] Yes Take 81 mg by mouth daily.  [provider] Taking Active Self           Med Note Lauretta Chester, ALEXANDRE  A   Wed Jan 24, 2021  8:17 AM)    atorvastatin (LIPITOR) 40 MG tablet [id number] Yes TAKE 1 TABLET(40 MG) BY MOUTH DAILY  Patient taking differently: Take 40 mg by mouth daily.   Malva Limes, MD Taking Active Self  carvedilol (COREG) 6.25 MG tablet [id number] Yes Take 1 tablet (6.25 mg total) by mouth 2 (two) times daily with a meal. Sunnie Nielsen, DO Taking Active Self           Med Note Alonza Smoker, TIFFANY A   Wed Apr 23, 2023  4:19 PM)    dextromethorphan-guaiFENesin [facility] DM) 30-600 MG 12hr tablet [id number] Yes Take 1 tablet by mouth 2 (two) times daily as needed for cough. Arnetha Courser, MD Taking Active   ipratropium-albuterol (DUONEB) 0.5-2.5 (3) MG/3ML SOLN [id number] Yes Inhale 3 mLs into the lungs every 4 (four) hours as needed (wheezing, shob). Arnetha Courser, MD Taking Active   isosorbide mononitrate (IMDUR) 30 MG 24 hr tablet [id number] Yes TAKE 1/2 TABLET(15 MG) BY MOUTH DAILY  Patient taking differently: Take 15 mg by mouth daily.   Malva Limes, MD Taking Active Self  levofloxacin [facility]) 500 MG tablet [id number] Yes Take 1 tablet (500 mg total) by mouth every other day for 2 doses. Arnetha Courser, MD Taking Active   Magnesium 200 MG TABS [id number] Yes Take 1 tablet (200 mg total) by mouth in the morning and at bedtime. Delma Freeze, FNP Taking Active Self           Med Note Alonza Smoker, TIFFANY A   Wed Apr 23, 2023  4:20 PM) 250 mg twice daily  pantoprazole (PROTONIX) 40 MG tablet [id number] Yes TAKE 1 TABLET BY MOUTH EVERY DAY Malva Limes, MD Taking Active Self  potassium chloride SA (KLOR-CON M) 20 MEQ tablet [id number] Yes Take 20 mEq by mouth daily. [provider] Taking Active Self  spironolactone (ALDACTONE) 25 MG tablet [id number] Yes Take 1 tablet (25 mg total) by mouth daily. Arnetha Courser, MD Taking Active   torsemide 60 MG TABS [id number] Yes Take 60 mg by mouth 2 (two) times daily. Arnetha Courser, MD Taking Active   triamcinolone ointment (KENALOG) 0.1  % [id number] Yes APPLY TOPICALLY TWICE DAILY AS DIRECTED Malva Limes, MD Taking Active Self            Home Care and Equipment/Supplies: Were Home Health Services Ordered?: Yes Name of Home Health Agency:: [facility] Has Agency set up a time to come to your home?: Yes First Home Health Visit Date: 04/30/23 Any new equipment or medical supplies ordered?: NA  Functional Questionnaire: Do you need assistance with bathing/showering or dressing?: Yes Do you need assistance with meal preparation?: Yes Do you need assistance with eating?: No Do you have difficulty maintaining continence: No Do you need assistance with getting out of bed/getting out of a chair/moving?: No Do you have difficulty managing or taking your medications?: No  Follow up appointments reviewed: PCP Follow-up appointment confirmed?: Yes Date of PCP follow-up appointment?: 05/05/23 Follow-up Provider: Dr Sherrie Mustache 1:45 Specialist [facility] Follow-up appointment confirmed?: Yes Date of Specialist follow-up appointment?: 05/05/23 Follow-Up Specialty Provider:: Clarisa Kindred 9:45, [id number] Cardiac Rehab, [id number] Dr Micki Riley Do you need transportation to your follow-up appointment?: No Do you understand care options if your condition(s) worsen?: Yes-patient verbalized understanding  SDOH Interventions Today    Flowsheet Row Most Recent Value  SDOH Interventions   Food Insecurity Interventions Intervention Not Indicated  Housing Interventions Intervention Not Indicated  Transportation Interventions Intervention Not Indicated, Patient Resources (Friends/Family)      Interventions Today    Flowsheet Row Most Recent Value  General Interventions   General Interventions Discussed/Reviewed General Interventions Discussed, General Interventions Reviewed, Doctor Visits  [Care guide rescheduled appt with Care Coordinator]  Doctor Visits Discussed/Reviewed Doctor Visits Discussed, Doctor Visits Reviewed  Exercise  Interventions   Exercise Discussed/Reviewed Exercise Discussed  [Patient is taking walks to sons house 4 doors down in [place]]      TOC Interventions Today    Flowsheet Row Most Recent Value  TOC Interventions   TOC Interventions Discussed/Reviewed TOC Interventions Discussed, TOC Interventions Reviewed        Gean Maidens BSN RN [facility] [phone]

## 2023-04-30 NOTE — Transitions of Care (Post Inpatient/ED Visit) (Signed)
   04/30/2023  Name: Taylor [name] MRN: [id number] DOB: 07-22-1941  Today's TOC FU Call Status: Today's TOC FU Call Status:: Successful TOC FU Call Competed TOC FU Call Complete Date: 04/30/23  Transition Care Management Follow-up Telephone Call Date of Discharge: 04/29/23 Discharge Facility: [facility]) Type of Discharge: Inpatient Admission Primary Inpatient Discharge Diagnosis:: Acute on chronic combined systolic and diastolic CHF  Patient is having some shortness of breath. He is doing a breathing treatment now and RN will follow up today to see if better    Gean Maidens BSN RN [facility] [phone]

## 2023-05-01 DIAGNOSIS — E7800 Pure hypercholesterolemia, unspecified: Secondary | ICD-10-CM | POA: Diagnosis not present

## 2023-05-01 DIAGNOSIS — I69898 Other sequelae of other cerebrovascular disease: Secondary | ICD-10-CM | POA: Diagnosis not present

## 2023-05-01 DIAGNOSIS — I110 Hypertensive heart disease with heart failure: Secondary | ICD-10-CM | POA: Diagnosis not present

## 2023-05-01 DIAGNOSIS — J449 Chronic obstructive pulmonary disease, unspecified: Secondary | ICD-10-CM | POA: Diagnosis not present

## 2023-05-01 DIAGNOSIS — I25119 Atherosclerotic heart disease of native coronary artery with unspecified angina pectoris: Secondary | ICD-10-CM | POA: Diagnosis not present

## 2023-05-01 DIAGNOSIS — I501 Left ventricular failure: Secondary | ICD-10-CM | POA: Diagnosis not present

## 2023-05-01 DIAGNOSIS — I509 Heart failure, unspecified: Secondary | ICD-10-CM | POA: Diagnosis not present

## 2023-05-01 DIAGNOSIS — E1122 Type 2 diabetes mellitus with diabetic chronic kidney disease: Secondary | ICD-10-CM | POA: Diagnosis not present

## 2023-05-01 DIAGNOSIS — T82898A Other specified complication of vascular prosthetic devices, implants and grafts, initial encounter: Secondary | ICD-10-CM | POA: Diagnosis not present

## 2023-05-01 DIAGNOSIS — N184 Chronic kidney disease, stage 4 (severe): Secondary | ICD-10-CM | POA: Diagnosis not present

## 2023-05-02 DIAGNOSIS — J4489 Other specified chronic obstructive pulmonary disease: Secondary | ICD-10-CM | POA: Diagnosis not present

## 2023-05-04 DIAGNOSIS — J9621 Acute and chronic respiratory failure with hypoxia: Secondary | ICD-10-CM | POA: Diagnosis not present

## 2023-05-05 ENCOUNTER — Ambulatory Visit
Admission: RE | Admit: 2023-05-05 | Discharge: 2023-05-05 | Disposition: A | Discharge status: Home / Self Care | Payer: Medicare Other | Source: Ambulatory Visit | Attending: Family Medicine | Admitting: Family Medicine

## 2023-05-05 ENCOUNTER — Ambulatory Visit (INDEPENDENT_AMBULATORY_CARE_PROVIDER_SITE_OTHER): Payer: Medicare Other | Admitting: Family Medicine

## 2023-05-05 ENCOUNTER — Encounter: Payer: Medicare Other | Admitting: Family

## 2023-05-05 ENCOUNTER — Encounter: Payer: Self-pay | Admitting: Family Medicine

## 2023-05-05 ENCOUNTER — Ambulatory Visit
Admission: RE | Admit: 2023-05-05 | Discharge: 2023-05-05 | Disposition: A | Discharge status: Home / Self Care | Payer: Medicare Other | Attending: Family Medicine | Admitting: Family Medicine

## 2023-05-05 VITALS — BP 100/67 | HR 61 | Temp 97.9°F | Resp 13 | Wt 181.9 lb

## 2023-05-05 DIAGNOSIS — R188 Other ascites: Secondary | ICD-10-CM

## 2023-05-05 DIAGNOSIS — R052 Subacute cough: Secondary | ICD-10-CM | POA: Diagnosis not present

## 2023-05-05 DIAGNOSIS — I10 Essential (primary) hypertension: Secondary | ICD-10-CM

## 2023-05-05 DIAGNOSIS — L989 Disorder of the skin and subcutaneous tissue, unspecified: Secondary | ICD-10-CM | POA: Diagnosis not present

## 2023-05-05 DIAGNOSIS — N1832 Chronic kidney disease, stage 3b: Secondary | ICD-10-CM | POA: Diagnosis not present

## 2023-05-05 DIAGNOSIS — E1122 Type 2 diabetes mellitus with diabetic chronic kidney disease: Secondary | ICD-10-CM

## 2023-05-05 DIAGNOSIS — N184 Chronic kidney disease, stage 4 (severe): Secondary | ICD-10-CM | POA: Diagnosis not present

## 2023-05-05 DIAGNOSIS — J189 Pneumonia, unspecified organism: Secondary | ICD-10-CM | POA: Insufficient documentation

## 2023-05-05 DIAGNOSIS — I509 Heart failure, unspecified: Secondary | ICD-10-CM | POA: Diagnosis not present

## 2023-05-05 DIAGNOSIS — K7689 Other specified diseases of liver: Secondary | ICD-10-CM | POA: Diagnosis not present

## 2023-05-05 DIAGNOSIS — I5043 Acute on chronic combined systolic (congestive) and diastolic (congestive) heart failure: Secondary | ICD-10-CM

## 2023-05-05 DIAGNOSIS — K4090 Unilateral inguinal hernia, without obstruction or gangrene, not specified as recurrent: Secondary | ICD-10-CM

## 2023-05-05 MED ORDER — BENZONATATE 200 MG PO CAPS
200.0000 mg | ORAL_CAPSULE | Freq: Three times a day (TID) | ORAL | 0 refills | Status: DC | PRN
Start: 2023-05-05 — End: 2023-07-31

## 2023-05-05 NOTE — Progress Notes (Signed)
Established patient visit   Patient: Taylor [name]   DOB: November 10, 1941   82 y.o. Male  MRN: [id number] Visit Date: 05/05/2023  Today's healthcare provider: Mila Merry, MD   No chief complaint on file.  Subjective    HPI  Patient states since discharge from the hospital he has been feeling his energy levels low. Patient was admitted to [facility] on 04/23/2023 and discharged on 04/29/2023. Was hospitalized for CHF, anasarca, ascites, and pneumonia. He required his dose IV furosemide and antibiotic during hospitalization. 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 He states he is taking 3 Torsemide twice a day, but discharge instruction say to take a 60mg  tablet twice a day.   He was also treated for pneumonia with levofloxaxine. He reports a painful hernia that has been present since 1994 but has recently become more bothersome, particularly after a bout of pneumonia during his recent hospital stay. The hernia is on both sides, but the right side is causing more discomfort. The patient also reports a persistent cough, which he has been managing with Mucinex, but with limited success. He also mentions a seeping wound on his toe of clear fluid, which started after his hospital discharge. The wound is not painful.   He is also noted to have nodular, possibly cirrhotic liver on recent abdominal CT.   He also several large keratotic skin lesions on his left arm that he wants to see a dermatologist about.    ----------------------------------------------------------------------------------------- -   Medications: Outpatient Medications Prior to Visit  Medication Sig   acetaminophen (TYLENOL) 500 MG tablet Take 500 mg by mouth every 6 (six) hours as needed for mild pain.   albuterol (VENTOLIN HFA) 108 (90 Base) MCG/ACT inhaler Inhale 2 puffs into the lungs every 6 (six) hours as needed for wheezing or shortness  of breath.   allopurinol (ZYLOPRIM) 300 MG tablet Take 150 mg by mouth daily.   apixaban (ELIQUIS) 2.5 MG TABS tablet Take 1 tablet (2.5 mg total) by mouth 2 (two) times daily.   aspirin 81 MG EC tablet Take 81 mg by mouth daily.    atorvastatin (LIPITOR) 40 MG tablet TAKE 1 TABLET(40 MG) BY MOUTH DAILY (Patient taking differently: Take 40 mg by mouth daily.)   carvedilol (COREG) 6.25 MG tablet Take 1 tablet (6.25 mg total) by mouth 2 (two) times daily with a meal.   dextromethorphan-guaiFENesin (MUCINEX DM) 30-600 MG 12hr tablet Take 1 tablet by mouth 2 (two) times daily as needed for cough.   ipratropium-albuterol (DUONEB) 0.5-2.5 (3) MG/3ML SOLN Inhale 3 mLs into the lungs every 4 (four) hours as needed (wheezing, shob).   isosorbide mononitrate (IMDUR) 30 MG 24 hr tablet TAKE 1/2 TABLET(15 MG) BY MOUTH DAILY (Patient taking differently: Take 15 mg by mouth daily.)   Magnesium 200 MG TABS Take 1 tablet (200 mg total) by mouth in the morning and at bedtime.   pantoprazole (PROTONIX) 40 MG tablet TAKE 1 TABLET BY MOUTH EVERY DAY   potassium chloride SA (KLOR-CON M) 20 MEQ tablet Take 20 mEq by mouth daily.   spironolactone (ALDACTONE) 25 MG tablet Take 1 tablet (25 mg total) by mouth daily.   torsemide 60 MG TABS Take 60 mg by mouth 2 (two) times daily.   triamcinolone ointment (KENALOG) 0.1 % APPLY TOPICALLY TWICE DAILY AS DIRECTED   No facility-administered medications prior to visit.    Review of  Systems  Constitutional:  Positive for fatigue. Negative for chills and fever.  Respiratory:  Positive for cough and wheezing. Negative for shortness of breath and stridor.   Cardiovascular:  Negative for chest pain, palpitations and leg swelling.  Gastrointestinal:  Negative for abdominal pain.    Last CBC Lab Results  Component Value Date   WBC 5.7 04/25/2023   HGB 11.9 (L) 04/25/2023   HCT 39.1 04/25/2023   MCV 98.7 04/25/2023   MCH 30.1 04/25/2023   RDW 19.3 (H) 04/25/2023   PLT  102 (L) 04/25/2023   Last metabolic panel Lab Results  Component Value Date   GLUCOSE 111 (H) 04/29/2023   NA 135 04/29/2023   K 4.2 04/29/2023   CL 97 (L) 04/29/2023   CO2 28 04/29/2023   BUN 64 (H) 04/29/2023   CREATININE 2.77 (H) 04/29/2023   GFRNONAA 22 (L) 04/29/2023   CALCIUM 8.8 (L) 04/29/2023   PHOS 5.4 (H) 04/05/2023   PROT 6.8 04/23/2023   ALBUMIN 2.6 (L) 04/23/2023   LABGLOB 3.7 03/05/2023   AGRATIO 0.9 (L) 03/05/2023   BILITOT 2.3 (H) 04/23/2023   ALKPHOS 140 (H) 04/23/2023   AST 31 04/23/2023   ALT 10 04/23/2023   ANIONGAP 10 04/29/2023   Last thyroid functions Lab Results  Component Value Date   TSH 3.049 12/24/2022        Objective    BP 100/67 (BP Location: Right Arm, Patient Position: Sitting, Cuff Size: Normal)   Pulse 61   Temp 97.9 F (36.6 C) (Oral)   Resp 13   Wt 181 lb 14.4 oz (82.5 kg)   SpO2 97%   BMI 26.10 kg/m    Physical Exam    General: Appearance:    Well developed, well nourished male in no acute distress  Eyes:    PERRL, conjunctiva/corneas clear, EOM's intact       Lungs:     Occasional scattered wheezes, respirations unlabored  Heart:    Normal heart rate. Irregularly irregular rhythm.     MS:   All extremities are intact.    Neurologic:   Awake, alert, oriented x 3. No apparent focal neurological defect.          Assessment & Plan     1. Acute on chronic combined systolic and diastolic congestive heart failure (HCC) Stable since discharge off of Jardiance and metolazone, now on spironolactone. Unclear exactly how much torsemide he is taking, but is supposed to be 60mg  BID.   2. Hypertension, unspecified type   3. Chronic kidney disease, stage 3b (HCC) Has follow up scheduled renal in 2 weeks.  - CBC - Parathyroid hormone, intact (no Ca)  4. Pneumonia due to infectious organism, unspecified laterality, unspecified part of lung Completed oral antibiotic prescribed at discharge.  - DG Chest 2 View; Future  5.  Non-recurrent unilateral inguinal hernia without obstruction or gangrene Much worse since having persistent cough with recent diagnosis of pneumonia.   6. Subacute cough  - benzonatate (TESSALON) 200 MG capsule; Take 1 capsule (200 mg total) by mouth 3 (three) times daily as needed for cough.  Dispense: 20 capsule; Refill: 0  7. Type 2 diabetes mellitus with stage 4 chronic kidney disease, without long-term current use of insulin (HCC) Now off of Jardiance.  - Comprehensive metabolic panel - Hemoglobin A1c  8. Other ascites  - Ambulatory referral to Gastroenterology  9. Liver nodule  - Ambulatory referral to Gastroenterology  10. Skin lesions  - Ambulatory referral to  Dermatology      The entirety of the information documented in the History of Present Illness, Review of Systems and Physical Exam were personally obtained by me. Portions of this information were initially documented by the CMA and reviewed by me for thoroughness and accuracy.     Mila Merry, MD  [facility] [phone] (phone) [phone] (fax)  [facility]

## 2023-05-05 NOTE — Progress Notes (Deleted)
PCP: Malva Limes, MD (last seen 04/24) Primary Cardiologist: Marcina Millard, MD (last seen 04/24) [facility] provider: Edwena Blow, Adam, MD (last seen 03/24)  HPI:  Taylor [name] is a 82 y/o male with a history of AAA, bladder cancer, DM, atrial fibrillation, CAD, hyperlipidemia, HTN, stroke, GERD, melanoma, pancreatitis, sleep apnea, VF and chronic heart failure.   Echo 10/30/22: EF of <20% along with mild LVH, severe LAE, severe Taylor, mild AR and moderate/severe TR. Echo TEE 02/17/23: EF 20% NORMAL APPEARING MV WITH FUNCTIONAL MODERATE Taylor (PISA radius = 0.6 cm,    VC = 0.45 cm, EROA = 0.14 cm2, Regurg Volume = 23 mL). THERE IS BLUNTING OF THE PULMONARY VEIN S WAVE WITHOUT REVERSAL. SEVERE LV AND RV SYSTOLIC DYSFUNCTION. MILD AR, TRIVIAL PR, MODERATE TR. NO LAA THROMBUS PRESENT.   Stress test 05/07/21: Moderate to severe LV systolic dysfunction  Large perfusion abnormality of severe intensity in the anterior apical  myocardial perfusion distribution consistent with previous infarct and/or  scar without evidence of myocardial ischemia   RHC/LHC 10/02/12: Left Main:  normal       LAD system:  significant       LCX system:  significant       RCA system:  normal       Number of vessels with significant CAD:  2 - Vessel   Left Ventriculogram       Ejection Fraction:   65%   Admitted 04/23/23 due to weight gain, shortness of breath, and falling at [facility] hardware store. IV diuresed with transition to oral diuretics. Jardiance stopped. S/p paracentesis with removal of 6.8 L of amber-colored fluid. To continue with cardiac rehab. Antibiotics given for COPD exacerbation. Nephrology and cardiology consults obtained. Admitted 04/03/23 due to worsening lower extremity swelling, abdominal distention, shortness of breath and productive cough for last few days. On arrival in the ED He was hypertensive and hypoxic requiring 4 L of supplemental oxygen. Chest x-ray shows cardiomegaly with mild to moderate pulmonary edema  and bilateral pleural effusion. CT abdomen shows moderate ascites with anasarca, nodular liver and severe atrophy of left kidney. Patient underwent ultrasound-guided paracentesis 6.6 L of clear fluid drained. IV diuresed. Admitted 02/25/23 due to worsening SOB due to acute/ chronic HF. Placed on oxygen. Chest x-ray with enlargement of cardiac silhouette and mild pulmonary edema. CT of the chest abdomen pelvis with moderate to severe ascites with anasarca. Paracentesis yielded 6L fluids. Jardiance held due to AKI. Admitted 10/30/22 due to DOE and orthopnea x 7 days. Chest xray showed cardiomegaly and central vascular congestion. Cardiology & nephrology consults obtained. Given IV lasix. Cr worsened with diuretics so discontinued and put on gentle fluids in setting of HF. Placed on oxygen. Discharged after 3 days. Was in the ED 10/27/22 due to nausea epigastric discomfort some shortness of breath;   He presents today for a HF follow-up visit with a chief complaint of      ROS: All systems negative except as listed in HPI, PMH and Problem List.  SH:  Social History   Socioeconomic History   Marital status: Married    Spouse name: [name]   Number of children: 3   Years of education: Not on file   Highest education level: 12th grade  Occupational History   Occupation: retired    Comment: previously worked as a Chief Financial Officer  Tobacco Use   Smoking status: Former    Packs/day: 1.00    Years: 30.00    Additional pack years: 0.00  Total pack years: 30.00    Types: Cigarettes    Quit date: 11/26/1999    Years since quitting: 23.4   Smokeless tobacco: Never  Vaping Use   Vaping Use: Never used  Substance and Sexual Activity   Alcohol use: No   Drug use: No   Sexual activity: Not Currently  Other Topics Concern   Not on file  Social History Narrative   Lives at home with his family.  Independent at baseline.   Social Determinants of Health   Financial Resource Strain: Low Risk  (05/07/2022)    Overall Financial Resource Strain (CARDIA)    Difficulty of Paying Living Expenses: Not hard at all  Food Insecurity: No Food Insecurity (04/30/2023)   Hunger Vital Sign    Worried About Running Out of Food in the Last Year: Never true    Ran Out of Food in the Last Year: Never true  Transportation Needs: No Transportation Needs (04/30/2023)   PRAPARE - Administrator, Civil Service (Medical): No    Lack of Transportation (Non-Medical): No  Physical Activity: Insufficiently Active (04/18/2021)   Exercise Vital Sign    Days of Exercise per Week: 1 day    Minutes of Exercise per Session: 120 min  Stress: No Stress Concern Present (05/07/2022)   [employer] of Occupational Health - Occupational Stress Questionnaire    Feeling of Stress : Not at all  Social Connections: Moderately Isolated (05/07/2022)   Social Connection and Isolation Panel [NHANES]    Frequency of Communication with Friends and Family: Never    Frequency of Social Gatherings with Friends and Family: Once a week    Attends Religious Services: More than 4 times per year    Active Member of [employer] or Organizations: No    Attends Banker Meetings: Never    Marital Status: Married  Catering manager Violence: Not At Risk (04/24/2023)   Humiliation, Afraid, Rape, and Kick questionnaire    Fear of Current or Ex-Partner: No    Emotionally Abused: No    Physically Abused: No    Sexually Abused: No    FH:  Family History  Problem Relation Age of Onset   Cancer Mother        Melanoma skin cancer   Heart attack Father 40   Cancer Father        throat cancer   Arthritis Brother     Past Medical History:  Diagnosis Date   AAA (abdominal aortic aneurysm) (HCC) 06/03/2007   [facility]; Dr. Hart [place]   AICD (automatic cardioverter/defibrillator) present    Arrhythmia    atrial fibrillation   Barrett's esophagus    Bladder cancer (HCC)    Bradycardia    CAD (coronary artery  disease)    CAP (community acquired pneumonia) 11/13/2019   CHF (congestive heart failure) (HCC)    Cluster headache    COVID-19 09/27/2021   DDD (degenerative disc disease), lumbar    Diabetes mellitus without complication (HCC)    Dyspnea    WITH EXERTION   Edema    LEFT ANKLE   Fracture of skull base (HCC) 1997   due to fall   GERD (gastroesophageal reflux disease)    Gout    History of bladder cancer 12/1995   Hyperlipidemia    Hypertension    Hypocalcemia 04/19/2020   Possibly secondary to diuretics.   Low=5.9 04/19/2020   Malignant melanoma (HCC) 12/2012   right dorsal forearm excised  Myocardial infarction [facility])    LAST 2014   Osteoarthritis of knee    Other specified complication of vascular prosthetic devices, implants and grafts, initial encounter (HCC) 08/22/2021   Pacemaker 10/10/2006   Pancreatitis 11/22/2019   Pneumonia    2016   Pre-diabetes    Psoriasis    Rib fracture 1997   due to fall   Sleep apnea    CPAP   Stroke (HCC)    Venous incompetence     Current Outpatient Medications  Medication Sig Dispense Refill   acetaminophen (TYLENOL) 500 MG tablet Take 500 mg by mouth every 6 (six) hours as needed for mild pain.     albuterol (VENTOLIN HFA) 108 (90 Base) MCG/ACT inhaler Inhale 2 puffs into the lungs every 6 (six) hours as needed for wheezing or shortness of breath. 8 g 2   allopurinol (ZYLOPRIM) 300 MG tablet Take 150 mg by mouth daily.     apixaban (ELIQUIS) 2.5 MG TABS tablet Take 1 tablet (2.5 mg total) by mouth 2 (two) times daily. 60 tablet 1   aspirin 81 MG EC tablet Take 81 mg by mouth daily.      atorvastatin (LIPITOR) 40 MG tablet TAKE 1 TABLET(40 MG) BY MOUTH DAILY (Patient taking differently: Take 40 mg by mouth daily.) 90 tablet 1   carvedilol (COREG) 6.25 MG tablet Take 1 tablet (6.25 mg total) by mouth 2 (two) times daily with a meal. 60 tablet 0   dextromethorphan-guaiFENesin (MUCINEX DM) 30-600 MG 12hr tablet Take 1 tablet by mouth  2 (two) times daily as needed for cough. 30 tablet 0   ipratropium-albuterol (DUONEB) 0.5-2.5 (3) MG/3ML SOLN Inhale 3 mLs into the lungs every 4 (four) hours as needed (wheezing, shob). 360 mL 1   isosorbide mononitrate (IMDUR) 30 MG 24 hr tablet TAKE 1/2 TABLET(15 MG) BY MOUTH DAILY (Patient taking differently: Take 15 mg by mouth daily.) 45 tablet 2   Magnesium 200 MG TABS Take 1 tablet (200 mg total) by mouth in the morning and at bedtime. 60 tablet 5   pantoprazole (PROTONIX) 40 MG tablet TAKE 1 TABLET BY MOUTH EVERY DAY 90 tablet 4   potassium chloride SA (KLOR-CON M) 20 MEQ tablet Take 20 mEq by mouth daily.     spironolactone (ALDACTONE) 25 MG tablet Take 1 tablet (25 mg total) by mouth daily. 30 tablet 1   torsemide 60 MG TABS Take 60 mg by mouth 2 (two) times daily. 60 tablet 1   triamcinolone ointment (KENALOG) 0.1 % APPLY TOPICALLY TWICE DAILY AS DIRECTED 454 g 3   No current facility-administered medications for this visit.      PHYSICAL EXAM:  General:  Well appearing. No resp difficulty HEENT: normal Neck: supple. JVP flat. Carotids 2+ bilaterally; no bruits. No lymphadenopathy or thryomegaly appreciated. Cor: PMI normal. Regular rate & rhythm. No rubs, gallops or murmurs. Lungs: clear Abdomen: soft, nontender, nondistended. No hepatosplenomegaly. No bruits or masses. Good bowel sounds. Extremities: no cyanosis, clubbing, rash, edema Neuro: alert & orientedx3, cranial nerves grossly intact. Moves all 4 extremities w/o difficulty. Affect pleasant.   ECG:   ASSESSMENT & PLAN:  1: Ischemic heart failure with reduced ejection fraction- - NYHA class II - euvolemic - weighing daily; weight chart reviewed and shows gradual weight loss; reminded to call for an overnight weight gain of > 2 pounds or a weekly weight gain of > 5 pounds - weight 174.4 pounds from last visit here 2 months ago - Echo TEE  02/17/23: EF 20% NORMAL APPEARING MV WITH FUNCTIONAL MODERATE Taylor (PISA  radius = 0.6 cm, VC = 0.45 cm, EROA = 0.14 cm2, Regurg Volume = 23 mL). THERE IS BLUNTING OF THE PULMONARY VEIN S WAVE WITHOUT REVERSAL. SEVERE LV AND RV SYSTOLIC DYSFUNCTION. MILD AR, TRIVIAL PR, MODERATE TR. NO LAA THROMBUS PRESENT.  - echo 10/30/22 showed an EF of <20% along with mild LVH, severe LAE, severe Taylor, mild AR and moderate/severe TR - Stress test 05/07/21 showed moderate to severe LV systolic dysfunction, large perfusion abnormality of severe intensity in the anterior apical myocardial perfusion distribution consistent with previous infarct and/or scar without evidence of myocardial ischemia  - not adding salt to his food - continue carvedilol 12.5mg  BID - continue torsemide but will decrease this to 40mg  BID; can add the extra 20mg  back in if he has above weight gain or edema/ SOB  - continue jardiance 10mg  daily - renal function does not currently allow for entresto - saw [facility] ([place]) 03/24 - has AICD - Mg 04/05/23 was 2.3 - BNP 04/23/23 was 1516.7  2: HTN- - BP  - encouraged to wear compression socks daily with removal at bedtime - saw PCP Sherrie Mustache) 04/24 - BMP 04/29/23 reviewed and showed sodium 135, potassium 4.2, creatinine 2.77 and GFR 22 - saw nephrology Suezanne Jacquet) 05/24  3: Atrial fibrillation- - saw cardiology (Paraschos) 04/24 - continue apixaban 5mg  BID - has pacemaker with OptiVol capacity for fluid monitoring - battery change out done 02/13/23  4: CAD- - CABG 2007 - RHC/LHC done 10/02/12:      Left Main:  normal       LAD system:  significant       LCX system:  significant       RCA system:  normal       Left Ventriculogram Ejection Fraction:   65%  - atorvastatin 40mg  daily  5: DM with CKD-  - A1c 12/24/22 was 6.5%  6: Sleep apnea- - wearing CPAP nightly - saw pulmonology Karna Christmas) 03/24 - wearing oxygen at 2L with his CPAP

## 2023-05-06 DIAGNOSIS — J9621 Acute and chronic respiratory failure with hypoxia: Secondary | ICD-10-CM | POA: Diagnosis not present

## 2023-05-06 LAB — COMPREHENSIVE METABOLIC PANEL
ALT: 12 IU/L (ref 0–44)
AST: 34 IU/L (ref 0–40)
Albumin/Globulin Ratio: 0.9
Albumin: 3.1 g/dL — ABNORMAL LOW (ref 3.8–4.8)
Alkaline Phosphatase: 235 IU/L — ABNORMAL HIGH (ref 44–121)
BUN/Creatinine Ratio: 20 (ref 10–24)
BUN: 53 mg/dL — ABNORMAL HIGH (ref 8–27)
Bilirubin Total: 1.4 mg/dL — ABNORMAL HIGH (ref 0.0–1.2)
CO2: 25 mmol/L (ref 20–29)
Calcium: 8.6 mg/dL (ref 8.6–10.2)
Chloride: 99 mmol/L (ref 96–106)
Creatinine, Ser: 2.62 mg/dL — ABNORMAL HIGH (ref 0.76–1.27)
Globulin, Total: 3.6 g/dL (ref 1.5–4.5)
Glucose: 114 mg/dL — ABNORMAL HIGH (ref 70–99)
Potassium: 4.2 mmol/L (ref 3.5–5.2)
Sodium: 141 mmol/L (ref 134–144)
Total Protein: 6.7 g/dL (ref 6.0–8.5)
eGFR: 24 mL/min/{1.73_m2} — ABNORMAL LOW (ref >59)

## 2023-05-06 LAB — CBC
Hematocrit: 36.1 % — ABNORMAL LOW (ref 37.5–51.0)
Hemoglobin: 11.1 g/dL — ABNORMAL LOW (ref 13.0–17.7)
MCH: 29.9 pg (ref 26.6–33.0)
MCHC: 30.7 g/dL — ABNORMAL LOW (ref 31.5–35.7)
MCV: 97 fL (ref 79–97)
Platelets: 113 10*3/uL — ABNORMAL LOW (ref 150–450)
RBC: 3.71 x10E6/uL — ABNORMAL LOW (ref 4.14–5.80)
RDW: 16.2 % — ABNORMAL HIGH (ref 11.6–15.4)
WBC: 5.7 10*3/uL (ref 3.4–10.8)

## 2023-05-06 LAB — HEMOGLOBIN A1C
Est. average glucose Bld gHb Est-mCnc: 157 mg/dL
Hgb A1c MFr Bld: 7.1 % — ABNORMAL HIGH (ref 4.8–5.6)

## 2023-05-06 LAB — PARATHYROID HORMONE, INTACT (NO CA): PTH: 90 pg/mL — ABNORMAL HIGH (ref 15–65)

## 2023-05-07 ENCOUNTER — Telehealth: Payer: Self-pay | Admitting: Family Medicine

## 2023-05-07 ENCOUNTER — Encounter: Payer: Self-pay | Admitting: *Deleted

## 2023-05-07 DIAGNOSIS — I5022 Chronic systolic (congestive) heart failure: Secondary | ICD-10-CM

## 2023-05-07 NOTE — Progress Notes (Signed)
Cardiac Individual Treatment Plan  Patient Details  Name: Taylor [name] MRN: [id number] Date of Birth: Nov 14, 1941 Referring Provider:   Flowsheet Row Cardiac Rehab from 04/10/2023 in [facility] Cardiac and Pulmonary Rehab  Referring Provider Dr. Marcina Millard, MD       Initial Encounter Date:  Flowsheet Row Cardiac Rehab from 04/10/2023 in [facility] Cardiac and Pulmonary Rehab  Date 04/10/23       Visit Diagnosis: Heart failure, chronic systolic (HCC)  Patient's Home Medications on Admission:  Current Outpatient Medications:    acetaminophen (TYLENOL) 500 MG tablet, Take 500 mg by mouth every 6 (six) hours as needed for mild pain., Disp: , Rfl:    albuterol (VENTOLIN HFA) 108 (90 Base) MCG/ACT inhaler, Inhale 2 puffs into the lungs every 6 (six) hours as needed for wheezing or shortness of breath., Disp: 8 g, Rfl: 2   allopurinol (ZYLOPRIM) 300 MG tablet, Take 150 mg by mouth daily. (Patient not taking: Reported on 05/05/2023), Disp: , Rfl:    apixaban (ELIQUIS) 2.5 MG TABS tablet, Take 1 tablet (2.5 mg total) by mouth 2 (two) times daily., Disp: 60 tablet, Rfl: 1   aspirin 81 MG EC tablet, Take 81 mg by mouth daily. , Disp: , Rfl:    atorvastatin (LIPITOR) 40 MG tablet, TAKE 1 TABLET(40 MG) BY MOUTH DAILY (Patient taking differently: Take 40 mg by mouth daily.), Disp: 90 tablet, Rfl: 1   benzonatate (TESSALON) 200 MG capsule, Take 1 capsule (200 mg total) by mouth 3 (three) times daily as needed for cough., Disp: 20 capsule, Rfl: 0   carvedilol (COREG) 6.25 MG tablet, Take 1 tablet (6.25 mg total) by mouth 2 (two) times daily with a meal., Disp: 60 tablet, Rfl: 0   dextromethorphan-guaiFENesin (MUCINEX DM) 30-600 MG 12hr tablet, Take 1 tablet by mouth 2 (two) times daily as needed for cough., Disp: 30 tablet, Rfl: 0   ipratropium-albuterol (DUONEB) 0.5-2.5 (3) MG/3ML SOLN, Inhale 3 mLs into the lungs every 4 (four) hours as needed (wheezing, shob)., Disp: 360 mL, Rfl: 1   isosorbide  mononitrate (IMDUR) 30 MG 24 hr tablet, TAKE 1/2 TABLET(15 MG) BY MOUTH DAILY (Patient taking differently: Take 15 mg by mouth daily.), Disp: 45 tablet, Rfl: 2   Magnesium 200 MG TABS, Take 1 tablet (200 mg total) by mouth in the morning and at bedtime., Disp: 60 tablet, Rfl: 5   pantoprazole (PROTONIX) 40 MG tablet, TAKE 1 TABLET BY MOUTH EVERY DAY, Disp: 90 tablet, Rfl: 4   potassium chloride SA (KLOR-CON M) 20 MEQ tablet, Take 20 mEq by mouth daily., Disp: , Rfl:    spironolactone (ALDACTONE) 25 MG tablet, Take 1 tablet (25 mg total) by mouth daily., Disp: 30 tablet, Rfl: 1   torsemide 60 MG TABS, Take 60 mg by mouth 2 (two) times daily., Disp: 60 tablet, Rfl: 1   triamcinolone ointment (KENALOG) 0.1 %, APPLY TOPICALLY TWICE DAILY AS DIRECTED, Disp: 454 g, Rfl: 3  Past Medical History: Past Medical History:  Diagnosis Date   AAA (abdominal aortic aneurysm) (HCC) 06/03/2007   [facility]; Dr. Hart [place]   AICD (automatic cardioverter/defibrillator) present    Arrhythmia    atrial fibrillation   Barrett's esophagus    Bladder cancer (HCC)    Bradycardia    CAD (coronary artery disease)    CAP (community acquired pneumonia) 11/13/2019   CHF (congestive heart failure) (HCC)    Cluster headache    COVID-19 09/27/2021   DDD (degenerative disc disease), lumbar  Diabetes mellitus without complication (HCC)    Dyspnea    WITH EXERTION   Edema    LEFT ANKLE   Fracture of skull base (HCC) 1997   due to fall   GERD (gastroesophageal reflux disease)    Gout    History of bladder cancer 12/1995   Hyperlipidemia    Hypertension    Hypocalcemia 04/19/2020   Possibly secondary to diuretics.   Low=5.9 04/19/2020   Malignant melanoma (HCC) 12/2012   right dorsal forearm excised   Myocardial infarction [facility])    LAST 2014   Osteoarthritis of knee    Other specified complication of vascular prosthetic devices, implants and grafts, initial encounter (HCC) 08/22/2021    Pacemaker 10/10/2006   Pancreatitis 11/22/2019   Pneumonia    2016   Pre-diabetes    Psoriasis    Rib fracture 1997   due to fall   Sleep apnea    CPAP   Stroke (HCC)    Venous incompetence     Tobacco Use: Social History   Tobacco Use  Smoking Status Former   Packs/day: 1.00   Years: 30.00   Additional pack years: 0.00   Total pack years: 30.00   Types: Cigarettes   Quit date: 11/26/1999   Years since quitting: 23.4  Smokeless Tobacco Never    Labs: Review Flowsheet  More data exists      Latest Ref Rng & Units 08/06/2022 12/24/2022 02/25/2023 04/23/2023 05/05/2023  Labs for ITP Cardiac and Pulmonary Rehab  Hemoglobin A1c 4.8 - 5.6 % 6.5  6.5  - - 7.1   Bicarbonate 20.0 - 28.0 mmol/L - - 28.2  32.9  -  O2 Saturation % - - 25  22.6  -     Exercise Target Goals: Exercise Program Goal: Individual exercise prescription set using results from initial 6 min walk test and THRR while considering  patient's activity barriers and safety.   Exercise Prescription Goal: Initial exercise prescription builds to 30-45 minutes a day of aerobic activity, 2-3 days per week.  Home exercise guidelines will be given to patient during program as part of exercise prescription that the participant will acknowledge.   Education: Aerobic Exercise: - Group verbal and visual presentation on the components of exercise prescription. Introduces F.I.T.T principle from ACSM for exercise prescriptions.  Reviews F.I.T.T. principles of aerobic exercise including progression. Written material given at graduation. Flowsheet Row Cardiac Rehab from 04/16/2023 in [facility] Cardiac and Pulmonary Rehab  Education need identified 04/10/23       Education: Resistance Exercise: - Group verbal and visual presentation on the components of exercise prescription. Introduces F.I.T.T principle from ACSM for exercise prescriptions  Reviews F.I.T.T. principles of resistance exercise including progression. Written material  given at graduation.    Education: Exercise & Equipment Safety: - Individual verbal instruction and demonstration of equipment use and safety with use of the equipment. Flowsheet Row Cardiac Rehab from 04/16/2023 in [facility] Cardiac and Pulmonary Rehab  Date 04/03/23  Educator [facility]  Instruction Review Code 1- Verbalizes Understanding       Education: Exercise Physiology & General Exercise Guidelines: - Group verbal and written instruction with models to review the exercise physiology of the cardiovascular system and associated critical values. Provides general exercise guidelines with specific guidelines to those with heart or lung disease.    Education: Flexibility, Balance, Mind/Body Relaxation: - Group verbal and visual presentation with interactive activity on the components of exercise prescription. Introduces F.I.T.T principle from ACSM for exercise prescriptions. Reviews  F.I.T.T. principles of flexibility and balance exercise training including progression. Also discusses the mind body connection.  Reviews various relaxation techniques to help reduce and manage stress (i.e. Deep breathing, progressive muscle relaxation, and visualization). Balance handout provided to take home. Written material given at graduation.   Activity Barriers & Risk Stratification:  Activity Barriers & Cardiac Risk Stratification - 04/10/23 1413       Activity Barriers & Cardiac Risk Stratification   Activity Barriers Arthritis;Balance Concerns    Cardiac Risk Stratification High             6 Minute Walk:  6 Minute Walk     Row Name 04/10/23 1412         6 Minute Walk   Phase Initial     Distance 765 feet     Walk Time 6 minutes     # of Rest Breaks 0     MPH 1.45     METS 1.26     RPE 15     Perceived Dyspnea  3     VO2 Peak 4.4     Symptoms Yes (comment)     Comments leg fatigue     Resting HR 67 bpm     Resting BP 102/50     Resting Oxygen Saturation  97 %     Exercise Oxygen  Saturation  during 6 min walk 94 %     Max Ex. HR 95 bpm     Max Ex. BP 118/58     2 Minute Post BP 104/52              Oxygen Initial Assessment:  Oxygen Initial Assessment - 04/03/23 1005       Home Oxygen   Home Oxygen Device --    Sleep Oxygen Prescription --    Liters per minute --    Home Exercise Oxygen Prescription --    Home Resting Oxygen Prescription --    Compliance with Home Oxygen Use --      Initial 6 min Walk   Oxygen Used --      Program Oxygen Prescription   Program Oxygen Prescription --      Intervention   Short Term Goals --    Long  Term Goals --             Oxygen Re-Evaluation:   Oxygen Discharge (Final Oxygen Re-Evaluation):   Initial Exercise Prescription:  Initial Exercise Prescription - 04/10/23 1400       Date of Initial Exercise RX and Referring Provider   Date 04/10/23    Referring Provider Dr. Marcina Millard, MD      Oxygen   Maintain Oxygen Saturation 88% or higher      Treadmill   MPH 1    Grade 0    Minutes 15    METs 1.8      NuStep   Level 2    SPM 80    Minutes 15    METs 1.26      Biostep-RELP   Level 1    SPM 80    Minutes 15    METs 1.23      Prescription Details   Frequency (times per week) 2    Duration Progress to 30 minutes of continuous aerobic without signs/symptoms of physical distress      Intensity   THRR 40-80% of Max Heartrate 96-125    Ratings of Perceived Exertion 11-13    Perceived Dyspnea 0-4  Progression   Progression Continue to progress workloads to maintain intensity without signs/symptoms of physical distress.      Resistance Training   Training Prescription Yes    Weight 3 lb    Reps 10-15             Perform Capillary Blood Glucose checks as needed.  Exercise Prescription Changes:   Exercise Prescription Changes     Row Name 04/10/23 1400 04/23/23 1600 05/07/23 0700         Response to Exercise   Blood Pressure (Admit) 102/50 102/60 102/64      Blood Pressure (Exercise) 118/58 128/66 118/74     Blood Pressure (Exit) 104/52 110/64 100/58     Heart Rate (Admit) 67 bpm 77 bpm 77 bpm     Heart Rate (Exercise) 95 bpm 107 bpm 97 bpm     Heart Rate (Exit) 72 bpm 67 bpm 67 bpm     Oxygen Saturation (Admit) 97 % -- --     Oxygen Saturation (Exercise) 94 % -- --     Rating of Perceived Exertion (Exercise) 15 13 13      Perceived Dyspnea (Exercise) 3 -- --     Symptoms leg fatigue none none     Comments Results third full day of exercise --     Duration -- Progress to 30 minutes of  aerobic without signs/symptoms of physical distress Progress to 30 minutes of  aerobic without signs/symptoms of physical distress     Intensity -- THRR unchanged THRR unchanged       Progression   Progression -- Continue to progress workloads to maintain intensity without signs/symptoms of physical distress. Continue to progress workloads to maintain intensity without signs/symptoms of physical distress.     Average METs -- 2.18 1.89       Resistance Training   Training Prescription -- Yes Yes     Weight -- 3 lb 3 lb     Reps -- 10-15 10-15       Interval Training   Interval Training -- No No       Treadmill   MPH -- 1 1     Grade -- 0 0     Minutes -- 15 15     METs -- 1.77 1.77       NuStep   Level -- 2 3     Minutes -- 15 15     METs -- 2.6 2       Oxygen   Maintain Oxygen Saturation -- 88% or higher 88% or higher              Exercise Comments:   Exercise Comments     Row Name 04/14/23 1034           Exercise Comments First full day of exercise!  Patient was oriented to gym and equipment including functions, settings, policies, and procedures.  Patient's individual exercise prescription and treatment plan were reviewed.  All starting workloads were established based on the results of the 6 minute walk test done at initial orientation visit.  The plan for exercise progression was also introduced and progression will be  customized based on patient's performance and goals.                Exercise Goals and Review:   Exercise Goals     Row Name 04/10/23 1415             Exercise Goals   Increase Physical  Activity Yes       Intervention Provide advice, education, support and counseling about physical activity/exercise needs.;Develop an individualized exercise prescription for aerobic and resistive training based on initial evaluation findings, risk stratification, comorbidities and participant's personal goals.       Expected Outcomes Short Term: Attend rehab on a regular basis to increase amount of physical activity.;Long Term: Add in home exercise to make exercise part of routine and to increase amount of physical activity.;Long Term: Exercising regularly at least 3-5 days a week.       Increase Strength and Stamina Yes       Intervention Provide advice, education, support and counseling about physical activity/exercise needs.;Develop an individualized exercise prescription for aerobic and resistive training based on initial evaluation findings, risk stratification, comorbidities and participant's personal goals.       Expected Outcomes Short Term: Increase workloads from initial exercise prescription for resistance, speed, and METs.;Short Term: Perform resistance training exercises routinely during rehab and add in resistance training at home;Long Term: Improve cardiorespiratory fitness, muscular endurance and strength as measured by increased METs and functional capacity ( )       Able to understand and use rate of perceived exertion (RPE) scale Yes       Intervention Provide education and explanation on how to use RPE scale       Expected Outcomes Short Term: Able to use RPE daily in rehab to express subjective intensity level;Long Term:  Able to use RPE to guide intensity level when exercising independently       Able to understand and use Dyspnea scale Yes       Intervention Provide education and  explanation on how to use Dyspnea scale       Expected Outcomes Short Term: Able to use Dyspnea scale daily in rehab to express subjective sense of shortness of breath during exertion;Long Term: Able to use Dyspnea scale to guide intensity level when exercising independently       Knowledge and understanding of Target Heart Rate Range (THRR) Yes       Intervention Provide education and explanation of THRR including how the numbers were predicted and where they are located for reference       Expected Outcomes Short Term: Able to state/look up THRR;Long Term: Able to use THRR to govern intensity when exercising independently;Short Term: Able to use daily as guideline for intensity in rehab       Able to check pulse independently Yes       Intervention Provide education and demonstration on how to check pulse in carotid and radial arteries.;Review the importance of being able to check your own pulse for safety during independent exercise       Expected Outcomes Short Term: Able to explain why pulse checking is important during independent exercise;Long Term: Able to check pulse independently and accurately       Understanding of Exercise Prescription Yes       Intervention Provide education, explanation, and written materials on patient's individual exercise prescription       Expected Outcomes Short Term: Able to explain program exercise prescription;Long Term: Able to explain home exercise prescription to exercise independently                Exercise Goals Re-Evaluation :  Exercise Goals Re-Evaluation     Row Name 04/14/23 1034 04/23/23 1609 05/07/23 0739         Exercise Goal Re-Evaluation   Exercise Goals Review Able to understand and  use rate of perceived exertion (RPE) scale;Able to understand and use Dyspnea scale;Knowledge and understanding of Target Heart Rate Range (THRR);Understanding of Exercise Prescription Increase Physical Activity;Increase Strength and Stamina;Understanding  of Exercise Prescription Increase Physical Activity;Increase Strength and Stamina;Understanding of Exercise Prescription     Comments Reviewed RPE scale, THR and program prescription with pt today.  Pt voiced understanding and was given a copy of goals to take home. Sam is off to a good start in rehab.  He has completed his first three full day sessions thus far.  He has been able to do the workloads and already 2.6 METs on the NuStep.  We will continue to encourage consistent attendance and monitor his progress. Sam has only attended rehab once since the last review due to being in the hospital. He was able to improve to level 3 on the T4 nustep and continue to walk the treadmill at a speed of 1 mph with no incline. We will continue to monitor his progress when he returns to the program.     Expected Outcomes Short: Use RPE daily to regulate intensity. Long: Follow program prescription in THR. Short: Continue to attend rehab regularly Long: Continue to follow program prescription Short: Return to rehab when appropriate. Long: Continue to improve strength and stamina.              Discharge Exercise Prescription (Final Exercise Prescription Changes):  Exercise Prescription Changes - 05/07/23 0700       Response to Exercise   Blood Pressure (Admit) 102/64    Blood Pressure (Exercise) 118/74    Blood Pressure (Exit) 100/58    Heart Rate (Admit) 77 bpm    Heart Rate (Exercise) 97 bpm    Heart Rate (Exit) 67 bpm    Rating of Perceived Exertion (Exercise) 13    Symptoms none    Duration Progress to 30 minutes of  aerobic without signs/symptoms of physical distress    Intensity THRR unchanged      Progression   Progression Continue to progress workloads to maintain intensity without signs/symptoms of physical distress.    Average METs 1.89      Resistance Training   Training Prescription Yes    Weight 3 lb    Reps 10-15      Interval Training   Interval Training No      Treadmill    MPH 1    Grade 0    Minutes 15    METs 1.77      NuStep   Level 3    Minutes 15    METs 2      Oxygen   Maintain Oxygen Saturation 88% or higher             Nutrition:  Target Goals: Understanding of nutrition guidelines, daily intake of sodium 1500mg , cholesterol 200mg , calories 30% from fat and 7% or less from saturated fats, daily to have 5 or more servings of fruits and vegetables.  Education: All About Nutrition: -Group instruction provided by verbal, written material, interactive activities, discussions, models, and posters to present general guidelines for heart healthy nutrition including fat, fiber, MyPlate, the role of sodium in heart healthy nutrition, utilization of the nutrition label, and utilization of this knowledge for meal planning. Follow up email sent as well. Written material given at graduation. Flowsheet Row Cardiac Rehab from 04/16/2023 in [facility] Cardiac and Pulmonary Rehab  Education need identified 04/10/23       Biometrics:  Pre Biometrics - 04/10/23 1416  Pre Biometrics   Height 5' 8.5" (1.74 m)    Weight 188 lb 14.4 oz (85.7 kg)    Waist Circumference 44 inches    Hip Circumference 40 inches    Waist to Hip Ratio 1.1 %    BMI (Calculated) 28.3    Single Leg Stand 3.3 seconds              Nutrition Therapy Plan and Nutrition Goals:  Nutrition Therapy & Goals - 04/10/23 1401       Intervention Plan   Intervention Prescribe, educate and counsel regarding individualized specific dietary modifications aiming towards targeted core components such as weight, hypertension, lipid management, diabetes, heart failure and other comorbidities.    Expected Outcomes Short Term Goal: Understand basic principles of dietary content, such as calories, fat, sodium, cholesterol and nutrients.;Short Term Goal: A plan has been developed with personal nutrition goals set during dietitian appointment.;Long Term Goal: Adherence to prescribed nutrition  plan.             Nutrition Assessments:  MEDIFICTS Score Key: ?70 Need to make dietary changes  40-70 Heart Healthy Diet ? 40 Therapeutic Level Cholesterol Diet  Flowsheet Row Cardiac Rehab from 04/10/2023 in [facility] Cardiac and Pulmonary Rehab  Picture Your Plate Total Score on Admission 60      Picture Your Plate Scores: <16 Unhealthy dietary pattern with much room for improvement. 41-50 Dietary pattern unlikely to meet recommendations for good health and room for improvement. 51-60 More healthful dietary pattern, with some room for improvement.  >60 Healthy dietary pattern, although there may be some specific behaviors that could be improved.    Nutrition Goals Re-Evaluation:   Nutrition Goals Discharge (Final Nutrition Goals Re-Evaluation):   Psychosocial: Target Goals: Acknowledge presence or absence of significant depression and/or stress, maximize coping skills, provide positive support system. Participant is able to verbalize types and ability to use techniques and skills needed for reducing stress and depression.   Education: Stress, Anxiety, and Depression - Group verbal and visual presentation to define topics covered.  Reviews how body is impacted by stress, anxiety, and depression.  Also discusses healthy ways to reduce stress and to treat/manage anxiety and depression.  Written material given at graduation. Flowsheet Row Cardiac Rehab from 04/16/2023 in [facility] Cardiac and Pulmonary Rehab  Date 04/16/23  Educator SB  Instruction Review Code 1- [employer] Understanding       Education: Sleep Hygiene -Provides group verbal and written instruction about how sleep can affect your health.  Define sleep hygiene, discuss sleep cycles and impact of sleep habits. Review good sleep hygiene tips.    Initial Review & Psychosocial Screening:  Initial Psych Review & Screening - 04/03/23 1013       Initial Review   Current issues with None Identified;Current Stress  Concerns    Source of Stress Concerns Chronic Illness      Family Dynamics   Comments He can look to his wife for support. He states no depression or anxiety and does not take anything for his mood.      Barriers   Psychosocial barriers to participate in program The patient should benefit from training in stress management and relaxation.      Screening Interventions   Interventions To provide support and resources with identified psychosocial needs;Provide feedback about the scores to participant;Encouraged to exercise    Expected Outcomes Short Term goal: Utilizing psychosocial counselor, staff and physician to assist with identification of specific Stressors or current issues interfering  with healing process. Setting desired goal for each stressor or current issue identified.;Long Term Goal: Stressors or current issues are controlled or eliminated.;Short Term goal: Identification and review with participant of any Quality of Life or Depression concerns found by scoring the questionnaire.;Long Term goal: The participant improves quality of Life and PHQ9 Scores as seen by post scores and/or verbalization of changes             Quality of Life Scores:   Quality of Life - 04/10/23 1411       Quality of Life   Select Quality of Life      Quality of Life Scores   Health/Function Pre 24.97 %    Socioeconomic Pre 18.93 %    Psych/Spiritual Pre 22.93 %    Family Pre 23.8 %    GLOBAL Pre 23.13 %            Scores of 19 and below usually indicate a poorer quality of life in these areas.  A difference of  2-3 points is a clinically meaningful difference.  A difference of 2-3 points in the total score of the Quality of Life Index has been associated with significant improvement in overall quality of life, self-image, physical symptoms, and general health in studies assessing change in quality of life.  PHQ-9: Review Flowsheet  More data exists      05/05/2023 04/10/2023 01/03/2023  12/24/2022 08/06/2022  Depression screen PHQ 2/9  Decreased Interest 2 2 2 1  0  Down, Depressed, Hopeless 2 2 2 2  0  PHQ - 2 Score 4 4 4 3  0  Altered sleeping 0 0 1 0 -  Tired, decreased energy 2 2 3 3  -  Change in appetite 1 0 1 1 -  Feeling bad or failure about yourself  1 2 3 2  -  Trouble concentrating 1 1 1 2  -  Moving slowly or fidgety/restless 2 2 2 3  -  Suicidal thoughts 1 0 1 1 -  PHQ-9 Score 12 11 16 15  -  Difficult doing work/chores Somewhat difficult Somewhat difficult Somewhat difficult Somewhat difficult -   Interpretation of Total Score  Total Score Depression Severity:  1-4 = Minimal depression, 5-9 = Mild depression, 10-14 = Moderate depression, 15-19 = Moderately severe depression, 20-27 = Severe depression   Psychosocial Evaluation and Intervention:  Psychosocial Evaluation - 04/03/23 1013       Psychosocial Evaluation & Interventions   Interventions Relaxation education;Encouraged to exercise with the program and follow exercise prescription;Stress management education    Comments He can look to his wife for support. He states no depression or anxiety and does not take anything for his mood.    Expected Outcomes Short: Start [facility] to help with mood. Long: Maintain a healthy mental state    Continue Psychosocial Services  Follow up required by staff             Psychosocial Re-Evaluation:   Psychosocial Discharge (Final Psychosocial Re-Evaluation):   Vocational Rehabilitation: Provide vocational rehab assistance to qualifying candidates.   Vocational Rehab Evaluation & Intervention:   Education: Education Goals: Education classes will be provided on a variety of topics geared toward better understanding of heart health and risk factor modification. Participant will state understanding/return demonstration of topics presented as noted by education test scores.  Learning Barriers/Preferences:  Learning Barriers/Preferences - 04/03/23 1051        Learning Barriers/Preferences   Learning Barriers Hearing    Learning Preferences None  General Cardiac Education Topics:  AED/CPR: - Group verbal and written instruction with the use of models to demonstrate the basic use of the AED with the basic ABC's of resuscitation.   Anatomy and Cardiac Procedures: - Group verbal and visual presentation and models provide information about basic cardiac anatomy and function. Reviews the testing methods done to diagnose heart disease and the outcomes of the test results. Describes the treatment choices: Medical Management, Angioplasty, or Coronary Bypass Surgery for treating various heart conditions including Myocardial Infarction, Angina, Valve Disease, and Cardiac Arrhythmias.  Written material given at graduation. Flowsheet Row Cardiac Rehab from 04/16/2023 in [facility] Cardiac and Pulmonary Rehab  Education need identified 04/10/23       Medication Safety: - Group verbal and visual instruction to review commonly prescribed medications for heart and lung disease. Reviews the medication, class of the drug, and side effects. Includes the steps to properly store meds and maintain the prescription regimen.  Written material given at graduation.   Intimacy: - Group verbal instruction through game format to discuss how heart and lung disease can affect sexual intimacy. Written material given at graduation..   Know Your Numbers and Heart Failure: - Group verbal and visual instruction to discuss disease risk factors for cardiac and pulmonary disease and treatment options.  Reviews associated critical values for Overweight/Obesity, Hypertension, Cholesterol, and Diabetes.  Discusses basics of heart failure: signs/symptoms and treatments.  Introduces Heart Failure Zone chart for action plan for heart failure.  Written material given at graduation. Flowsheet Row Cardiac Rehab from 04/16/2023 in [facility] Cardiac and Pulmonary Rehab  Education need  identified 04/10/23       Infection Prevention: - Provides verbal and written material to individual with discussion of infection control including proper hand washing and proper equipment cleaning during exercise session. Flowsheet Row Cardiac Rehab from 04/16/2023 in [facility] Cardiac and Pulmonary Rehab  Date 04/03/23  Educator [facility]  Instruction Review Code 1- Verbalizes Understanding       Falls Prevention: - Provides verbal and written material to individual with discussion of falls prevention and safety. Flowsheet Row Cardiac Rehab from 04/16/2023 in [facility] Cardiac and Pulmonary Rehab  Date 04/03/23  Educator [facility]  Instruction Review Code 1- Verbalizes Understanding       Other: -Provides group and verbal instruction on various topics (see comments)   Knowledge Questionnaire Score:  Knowledge Questionnaire Score - 04/10/23 1401       Knowledge Questionnaire Score   Pre Score 18/26             Core Components/Risk Factors/Patient Goals at Admission:  Personal Goals and Risk Factors at Admission - 04/10/23 1412       Core Components/Risk Factors/Patient Goals on Admission    Weight Management Yes;Weight Loss    Intervention Weight Management: Provide education and appropriate resources to help participant work on and attain dietary goals.;Weight Management: Develop a combined nutrition and exercise program designed to reach desired caloric intake, while maintaining appropriate intake of nutrient and fiber, sodium and fats, and appropriate energy expenditure required for the weight goal.;Weight Management/Obesity: Establish reasonable short term and long term weight goals.;Obesity: Provide education and appropriate resources to help participant work on and attain dietary goals.    Admit Weight 188 lb 14.4 oz (85.7 kg)    Goal Weight: Short Term 183 lb (83 kg)    Goal Weight: Long Term 178 lb (80.7 kg)    Expected Outcomes Short Term: Continue to assess and modify  interventions until  short term weight is achieved;Long Term: Adherence to nutrition and physical activity/exercise program aimed toward attainment of established weight goal;Weight Loss: Understanding of general recommendations for a balanced deficit meal plan, which promotes 1-2 lb weight loss per week and includes a negative energy balance of [phone] kcal/d;Understanding recommendations for meals to include 15-35% energy as protein, 25-35% energy from fat, 35-60% energy from carbohydrates, less than 200mg  of dietary cholesterol, 20-35 gm of total fiber daily;Understanding of distribution of calorie intake throughout the day with the consumption of 4-5 meals/snacks    Diabetes Yes    Intervention Provide education about signs/symptoms and action to take for hypo/hyperglycemia.;Provide education about proper nutrition, including hydration, and aerobic/resistive exercise prescription along with prescribed medications to achieve blood glucose in normal ranges: Fasting glucose 65-99 mg/dL    Expected Outcomes Long Term: Attainment of HbA1C < 7%.;Short Term: Participant verbalizes understanding of the signs/symptoms and immediate care of hyper/hypoglycemia, proper foot care and importance of medication, aerobic/resistive exercise and nutrition plan for blood glucose control.    Heart Failure Yes    Intervention Provide a combined exercise and nutrition program that is supplemented with education, support and counseling about heart failure. Directed toward relieving symptoms such as shortness of breath, decreased exercise tolerance, and extremity edema.    Expected Outcomes Improve functional capacity of life;Short term: Attendance in program 2-3 days a week with increased exercise capacity. Reported lower sodium intake. Reported increased fruit and vegetable intake. Reports medication compliance.;Short term: Daily weights obtained and reported for increase. Utilizing diuretic protocols set by physician.;Long  term: Adoption of self-care skills and reduction of barriers for early signs and symptoms recognition and intervention leading to self-care maintenance.    Hypertension Yes    Intervention Provide education on lifestyle modifcations including regular physical activity/exercise, weight management, moderate sodium restriction and increased consumption of fresh fruit, vegetables, and low fat dairy, alcohol moderation, and smoking cessation.;Monitor prescription use compliance.    Expected Outcomes Short Term: Continued assessment and intervention until BP is < 140/23mm HG in hypertensive participants. < 130/35mm HG in hypertensive participants with diabetes, heart failure or chronic kidney disease.;Long Term: Maintenance of blood pressure at goal levels.    Lipids Yes    Intervention Provide education and support for participant on nutrition & aerobic/resistive exercise along with prescribed medications to achieve LDL 70mg , HDL >40mg .    Expected Outcomes Short Term: Participant states understanding of desired cholesterol values and is compliant with medications prescribed. Participant is following exercise prescription and nutrition guidelines.;Long Term: Cholesterol controlled with medications as prescribed, with individualized exercise RX and with personalized nutrition plan. Value goals: LDL < 70mg , HDL > 40 mg.             Education:Diabetes - Individual verbal and written instruction to review signs/symptoms of diabetes, desired ranges of glucose level fasting, after meals and with exercise. Acknowledge that pre and post exercise glucose checks will be done for 3 sessions at entry of program. Flowsheet Row Cardiac Rehab from 04/16/2023 in [facility] Cardiac and Pulmonary Rehab  Date 04/03/23  Educator [facility]  Instruction Review Code 1- Verbalizes Understanding       Core Components/Risk Factors/Patient Goals Review:    Core Components/Risk Factors/Patient Goals at Discharge (Final Review):     ITP Comments:  ITP Comments     Row Name 04/03/23 1038 04/10/23 1357 04/14/23 1033 05/01/23 0937 05/07/23 1120   ITP Comments Visit completed. Patient informed on EP and RD appointment and 6 Minute walk test. Patient  also informed of patient health questionnaires on My Chart. Patient Verbalizes understanding. Visit diagnosis can be found in [facility] 02/25/[facility]23. Patient came in with mild shortness of breath. He states it was the walk down from his car. Checked patients oxygen 94% and heart rate 63. He managed to get his breath back a little with pursed lipped breathing. Ask the patient a few more questions and it was clear his shortness of breath was not getting better. He was willing to go the [place] walk in clinic. When transitioning him to a different wheel chair he was more short of breath and oxygen saturations were going into the 80s. Informed staff that he was feeling sick the past few days and states his weight has increased. Staff at [facility] took him to the ER. Completed and gym orientation. Initial ITP created and sent for review to Dr. Bethann Punches, Medical Director. First full day of exercise!  Patient was oriented to gym and equipment including functions, settings, policies, and procedures.  Patient's individual exercise prescription and treatment plan were reviewed.  All starting workloads were established based on the results of the 6 minute walk test done at initial orientation visit.  The plan for exercise progression was also introduced and progression will be customized based on patient's performance and goals. Pt was readmitted on 5/29 after fall and had more fluid build up and pnuemonia.  He was discharged on 6/4.  He has follow up appt scheduled for 6/10.  He will need clearance from doctor prior to returning to rehab.  We have been unable to assess for goals at this time. 30 Day review completed. Medical Director ITP review done, changes made as directed, and signed approval by  Medical Director.   out for medical reasons            Comments:

## 2023-05-07 NOTE — Telephone Encounter (Signed)
Home Health Verbal Orders - Caller/Agency: Tresa Endo with [facility]  Callback Number: [phone] Requesting : Home Health Nursing Frequency: Plan of care approval for observation 1x3

## 2023-05-07 NOTE — Telephone Encounter (Signed)
Verbals given  

## 2023-05-07 NOTE — Telephone Encounter (Signed)
That's fine

## 2023-05-08 ENCOUNTER — Encounter: Payer: Medicare Other | Admitting: *Deleted

## 2023-05-08 DIAGNOSIS — N261 Atrophy of kidney (terminal): Secondary | ICD-10-CM | POA: Diagnosis not present

## 2023-05-08 DIAGNOSIS — E7800 Pure hypercholesterolemia, unspecified: Secondary | ICD-10-CM | POA: Diagnosis not present

## 2023-05-08 DIAGNOSIS — L408 Other psoriasis: Secondary | ICD-10-CM | POA: Diagnosis not present

## 2023-05-08 DIAGNOSIS — I509 Heart failure, unspecified: Secondary | ICD-10-CM | POA: Diagnosis not present

## 2023-05-08 DIAGNOSIS — N184 Chronic kidney disease, stage 4 (severe): Secondary | ICD-10-CM | POA: Diagnosis not present

## 2023-05-08 DIAGNOSIS — I10 Essential (primary) hypertension: Secondary | ICD-10-CM | POA: Diagnosis not present

## 2023-05-08 DIAGNOSIS — M1000 Idiopathic gout, unspecified site: Secondary | ICD-10-CM | POA: Diagnosis not present

## 2023-05-08 DIAGNOSIS — E1122 Type 2 diabetes mellitus with diabetic chronic kidney disease: Secondary | ICD-10-CM | POA: Diagnosis not present

## 2023-05-08 DIAGNOSIS — Z95810 Presence of automatic (implantable) cardiac defibrillator: Secondary | ICD-10-CM | POA: Diagnosis not present

## 2023-05-08 DIAGNOSIS — I25119 Atherosclerotic heart disease of native coronary artery with unspecified angina pectoris: Secondary | ICD-10-CM | POA: Diagnosis not present

## 2023-05-08 DIAGNOSIS — T82898A Other specified complication of vascular prosthetic devices, implants and grafts, initial encounter: Secondary | ICD-10-CM | POA: Diagnosis not present

## 2023-05-08 DIAGNOSIS — U071 COVID-19: Secondary | ICD-10-CM | POA: Diagnosis not present

## 2023-05-12 ENCOUNTER — Ambulatory Visit (INDEPENDENT_AMBULATORY_CARE_PROVIDER_SITE_OTHER): Payer: Medicare Other

## 2023-05-12 VITALS — Ht 70.0 in | Wt 180.0 lb

## 2023-05-12 DIAGNOSIS — Z0000 Encounter for general adult medical examination without abnormal findings: Secondary | ICD-10-CM

## 2023-05-12 NOTE — Progress Notes (Signed)
I connected with  Taylor [name] on 05/12/23 by a audio enabled telemedicine application and verified that I am speaking with the correct person using two identifiers.  Patient Location: Home  Provider Location: Office/Clinic  I discussed the limitations of evaluation and management by telemedicine. The patient expressed understanding and agreed to proceed.  Subjective:   Taylor [name] is a 82 y.o. male who presents for Medicare Annual/Subsequent preventive examination.  Review of Systems    Cardiac Risk Factors include: advanced age (>60men, >20 women);diabetes mellitus;hypertension;male gender     Objective:    Today's Vitals   05/12/23 0954 05/12/23 0955  Weight: 180 lb (81.6 kg)   Height: 5\' 10"  (1.778 m)   PainSc:  8    Body mass index is 25.83 kg/m.     05/12/2023   10:09 AM 04/23/2023   12:35 PM 04/03/2023   10:48 AM 04/03/2023   10:45 AM 02/25/2023    9:17 AM 02/13/2023    7:00 AM 10/31/2022   11:37 AM  Advanced Directives  Does Patient Have a Medical Advance Directive? Yes No No No No Yes No  Type of Estate agent of [place];Living will     Healthcare Power of [place];Living will   Copy of Healthcare Power of Attorney in Chart?      No - copy requested   Would patient like information on creating a medical advance directive?  No - Patient declined No - Patient declined No - Patient declined No - Patient declined  No - Patient declined    Current Medications (verified) Outpatient Encounter Medications as of 05/12/2023  Medication Sig   acetaminophen (TYLENOL) 500 MG tablet Take 500 mg by mouth every 6 (six) hours as needed for mild pain.   albuterol (VENTOLIN HFA) 108 (90 Base) MCG/ACT inhaler Inhale 2 puffs into the lungs every 6 (six) hours as needed for wheezing or shortness of breath.   allopurinol (ZYLOPRIM) 300 MG tablet Take 150 mg by mouth daily.   apixaban (ELIQUIS) 2.5 MG TABS tablet Take 1 tablet (2.5 mg total) by mouth 2 (two) times  daily.   aspirin 81 MG EC tablet Take 81 mg by mouth daily.    atorvastatin (LIPITOR) 40 MG tablet TAKE 1 TABLET(40 MG) BY MOUTH DAILY (Patient taking differently: Take 40 mg by mouth daily.)   benzonatate (TESSALON) 200 MG capsule Take 1 capsule (200 mg total) by mouth 3 (three) times daily as needed for cough.   carvedilol (COREG) 6.25 MG tablet Take 1 tablet (6.25 mg total) by mouth 2 (two) times daily with a meal.   dextromethorphan-guaiFENesin (MUCINEX DM) 30-600 MG 12hr tablet Take 1 tablet by mouth 2 (two) times daily as needed for cough.   ipratropium-albuterol (DUONEB) 0.5-2.5 (3) MG/3ML SOLN Inhale 3 mLs into the lungs every 4 (four) hours as needed (wheezing, shob).   isosorbide mononitrate (IMDUR) 30 MG 24 hr tablet TAKE 1/2 TABLET(15 MG) BY MOUTH DAILY (Patient taking differently: Take 15 mg by mouth daily.)   Magnesium 200 MG TABS Take 1 tablet (200 mg total) by mouth in the morning and at bedtime.   pantoprazole (PROTONIX) 40 MG tablet TAKE 1 TABLET BY MOUTH EVERY DAY   potassium chloride SA (KLOR-CON M) 20 MEQ tablet Take 20 mEq by mouth daily.   spironolactone (ALDACTONE) 25 MG tablet Take 1 tablet (25 mg total) by mouth daily.   torsemide 60 MG TABS Take 60 mg by mouth 2 (two) times daily.   triamcinolone  ointment (KENALOG) 0.1 % APPLY TOPICALLY TWICE DAILY AS DIRECTED   No facility-administered encounter medications on file as of 05/12/2023.    Allergies (verified) Amlodipine besylate, Rocephin [ceftriaxone], and Crestor [rosuvastatin]   History: Past Medical History:  Diagnosis Date   AAA (abdominal aortic aneurysm) (HCC) 06/03/2007   [facility]; Dr. Hart Rochester   AICD (automatic cardioverter/defibrillator) present    Arrhythmia    atrial fibrillation   Barrett's esophagus    Bladder cancer (HCC)    Bradycardia    CAD (coronary artery disease)    CAP (community acquired pneumonia) 11/13/2019   CHF (congestive heart failure) (HCC)    Cluster  headache    COVID-19 09/27/2021   DDD (degenerative disc disease), lumbar    Diabetes mellitus without complication (HCC)    Dyspnea    WITH EXERTION   Edema    LEFT ANKLE   Fracture of skull base (HCC) 1997   due to fall   GERD (gastroesophageal reflux disease)    Gout    History of bladder cancer 12/1995   Hyperlipidemia    Hypertension    Hypocalcemia 04/19/2020   Possibly secondary to diuretics.   Low=5.9 04/19/2020   Malignant melanoma (HCC) 12/2012   right dorsal forearm excised   Myocardial infarction [facility])    LAST 2014   Osteoarthritis of knee    Other specified complication of vascular prosthetic devices, implants and grafts, initial encounter (HCC) 08/22/2021   Pacemaker 10/10/2006   Pancreatitis 11/22/2019   Pneumonia    2016   Pre-diabetes    Psoriasis    Rib fracture 1997   due to fall   Sleep apnea    CPAP   Stroke [facility])    Venous incompetence    Past Surgical History:  Procedure Laterality Date   ABDOMINAL AORTIC ANEURYSM REPAIR  06/03/2007   [facility]; Dr. Hart Rochester   ANGIOPLASTY  1994   MI   BLADDER TUMOR EXCISION  12/1995   CAROTID PTA/STENT INTERVENTION Left 07/23/2022   Procedure: CAROTID PTA/STENT INTERVENTION;  Surgeon: Renford Dills, MD;  Location: [facility] INVASIVE CV LAB;  Service: Cardiovascular;  Laterality: Left;   CATARACT EXTRACTION W/PHACO Left 10/22/2016   Procedure: CATARACT EXTRACTION PHACO AND INTRAOCULAR LENS PLACEMENT (IOC);  Surgeon: Galen Manila, MD;  Location: [facility] ORS;  Service: Ophthalmology;  Laterality: Left;  [place] 47.7 AP% 18.4 CDE 8.78 Fluid pack lot # [id number]   CATARACT EXTRACTION W/PHACO Right 12/10/2016   Procedure: CATARACT EXTRACTION PHACO AND INTRAOCULAR LENS PLACEMENT (IOC);  Surgeon: Galen Manila, MD;  Location: [facility] ORS;  Service: Ophthalmology;  Laterality: Right;  [place] 00:39 AP% 23.3 CDE 9.13 Fluid pack lot # [id number]   CORONARY ANGIOPLASTY     STENTS X 5   CORONARY ARTERY BYPASS  GRAFT  09/22/2006   four   ELBOW BURSA SURGERY     DUE TO GOUT   INSERT / REPLACE / REMOVE PACEMAKER     MELANOMA EXCISION  12/2012   Right forearm   PPM GENERATOR CHANGEOUT N/A 02/13/2023   Procedure: PPM GENERATOR CHANGEOUT;  Surgeon: Marcina Millard, MD;  Location: [facility] INVASIVE CV LAB;  Service: Cardiovascular;  Laterality: N/A;   Family History  Problem Relation Age of Onset   Cancer Mother        Melanoma skin cancer   Heart attack Father 81   Cancer Father        throat cancer   Arthritis Brother    Social History  Socioeconomic History   Marital status: Married    Spouse name: [name]   Number of children: 3   Years of education: Not on file   Highest education level: 12th grade  Occupational History   Occupation: retired    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  Independent at baseline.   Social Determinants of Health   Financial Resource Strain: Low Risk  (05/12/2023)   Overall Financial Resource Strain (CARDIA)    Difficulty of Paying Living Expenses: Not hard at all  Food Insecurity: No Food Insecurity (05/12/2023)   Hunger Vital Sign    Worried About Running Out of Food in the Last Year: Never true    Ran Out of Food in the Last Year: Never true  Transportation Needs: No Transportation Needs (05/12/2023)   PRAPARE - Administrator, Civil Service (Medical): No    Lack of Transportation (Non-Medical): No  Physical Activity: Insufficiently Active (05/12/2023)   Exercise  Vital Sign    Days of Exercise per Week: 4 days    Minutes of Exercise per Session: 30 min  Stress: Stress Concern Present (05/12/2023)   [employer] of Occupational Health - Occupational Stress Questionnaire    Feeling of Stress : To some extent  Social Connections: Moderately Isolated (05/12/2023)   Social Connection and Isolation Panel [NHANES]    Frequency of Communication with Friends and Family: Once a week    Frequency of Social Gatherings with Friends and Family: Once a week    Attends Religious Services: More than 4 times per year    Active Member of [employer] or Organizations: No    Attends Engineer, structural: Never    Marital Status: Married    Tobacco Counseling Counseling given: Not Answered   Clinical Intake:  Pre-visit preparation completed: Yes  Pain : 0-10 Pain Score: 8  Pain Type: Chronic pain Pain Location: Back Pain Orientation: Lower Pain Descriptors / Indicators: Aching Pain Onset: More than a month ago Pain Frequency: Intermittent Pain Relieving Factors: Tylenol pm at night;4 hot showers a day  Pain Relieving Factors: Tylenol pm at night;4 hot showers a day  BMI - recorded: 25.83 Nutritional Status: BMI 25 -29 Overweight Nutritional Risks: None Diabetes: Yes CBG done?: No Did pt. bring in CBG monitor from home?: No  How often do you need to have someone help you when you read instructions, pamphlets, or other written materials from your doctor or pharmacy?: 1 - Never  Diabetic?yes  Interpreter Needed?: No  Comments: lives with wife Information entered by :: B.Kacee Koren,LPN   Activities of Daily Living    05/12/2023   10:09 AM 05/05/2023    2:01 PM  In your present state of health, do you have any difficulty performing the following activities:  Hearing? 1 1  Vision? 1 1  Difficulty concentrating or making decisions? 0 0  Walking or  climbing stairs? 1 1  Dressing or bathing? 1 1  Doing errands, shopping? 1 1  Preparing  Food and eating ? N   Using the Toilet? N   In the past six months, have you accidently leaked urine? N   Do you have problems with loss of bowel control? N   Managing your Medications? N   Managing your Finances? N   Housekeeping or managing your Housekeeping? N     Patient Care Team: Malva Limes, MD as PCP - General (Family Medicine) Lamar Blinks, MD as Consulting Physician (Cardiology) Galen Manila, MD as Referring Physician (Ophthalmology) Vida Rigger, MD as Consulting Physician (Pulmonary Disease) Gaspar Cola, [facility] (Pharmacist) Kemper Durie, RN as [facility] Care Management  Indicate any recent Medical Services you may have received from other than [facility] providers in the past year (date may be approximate).     Assessment:   This is a routine wellness examination for Pearson.  Hearing/Vision screen Hearing Screening - Comments:: Adequate hearing in rt;less in lft ear Vision Screening - Comments:: Adequate vision w/glasses [facility]  Dietary issues and exercise activities discussed: Current Exercise Habits: Home exercise routine, Type of exercise: walking, Time (Minutes): 30, Frequency (Times/Week): 4, Weekly Exercise (Minutes/Week): 120, Intensity: Mild, Exercise limited by: cardiac condition(s);neurologic condition(s);orthopedic condition(s);respiratory conditions(s)   Goals Addressed   None    Depression Screen    05/12/2023   10:05 AM 05/05/2023    2:00 PM 04/10/2023    2:01 PM 01/03/2023   10:02 AM 12/24/2022    8:24 AM 08/06/2022    9:00 AM 05/07/2022   11:20 AM  PHQ 2/9 Scores  PHQ - 2 Score 2 4 4 4 3  0 2  PHQ- 9 Score 5 12 11 16 15  4     Fall Risk    05/12/2023   10:01 AM 05/05/2023    2:00 PM 04/03/2023   10:08 AM 01/03/2023   10:01 AM 12/24/2022    8:24 AM  Fall Risk   Falls in the past year? 1 1 0 0 0  Number falls in past yr: 0 0 0 0 0  Injury with Fall? 1 1 0 0 0  Risk for fall due to : Impaired balance/gait  History of fall(s) No Fall Risks    Follow up Education provided;Falls prevention discussed Falls evaluation completed Falls evaluation completed;Education provided;Falls prevention discussed      FALL RISK PREVENTION PERTAINING TO THE HOME:  Any stairs in or around the home? Yes  If so, are there any without handrails? Yes  Home free of loose throw rugs in walkways, pet beds, electrical cords, etc? Yes  Adequate lighting in your home to reduce risk of falls? Yes   ASSISTIVE DEVICES UTILIZED TO PREVENT FALLS:  Life alert? Yes  Use of a cane, walker or w/c? Yes walker and cane Grab bars in the bathroom? Yes  Shower chair or bench in shower? No  Elevated toilet seat or a handicapped toilet? Yes    Cognitive Function:        05/12/2023   10:12 AM 03/31/2018    2:05 PM 02/26/2017    9:19 AM  6CIT Screen  What Year? 0 points 0 points 0 points  What month? 0 points 0 points 0 points  What time? 0 points 0 points 0 points  Count back from 20 0 points 0 points 0 points  Months in reverse 0 points 0 points 0 points  Repeat phrase 0  points 2 points 4 points  Total Score 0 points 2 points 4 points    Immunizations Immunization History  Administered Date(s) Administered   Fluad Quad(high Dose 65+) 10/04/2020, 08/06/2022   Influenza Split 09/18/2011   Influenza, High Dose Seasonal PF 08/16/2014, 10/09/2016, 10/09/2017, 08/26/2018   Influenza,inj,Quad PF,6+ Mos 12/20/2015   PFIZER(Purple Top)SARS-COV-2 Vaccination 02/24/2020, 03/21/2020   Pneumococcal Conjugate-13 05/09/2014   Pneumococcal Polysaccharide-23 05/12/2015    TDAP status: Up to date  Flu Vaccine status: Up to date  Pneumococcal vaccine status: Up to date  Covid-19 vaccine status: Completed vaccines  Qualifies for Shingles Vaccine? Yes   Zostavax completed No   Shingrix Completed?: No.    Education has been provided regarding the importance of this vaccine. Patient has been advised to call insurance company to  determine out of pocket expense if they have not yet received this vaccine. Advised may also receive vaccine at local pharmacy or Health Dept. Verbalized acceptance and understanding.  Screening Tests Health Maintenance  Topic Date Due   DTaP/Tdap/Td (1 - Tdap) Never done   Zoster Vaccines- Shingrix (1 of 2) Never done   COVID-19 Vaccine (3 - 2023-24 season) 07/26/2022   OPHTHALMOLOGY EXAM  01/07/2023   INFLUENZA VACCINE  06/26/2023   Diabetic kidney evaluation - Urine ACR  08/07/2023   FOOT EXAM  08/07/2023   HEMOGLOBIN A1C  11/04/2023   Diabetic kidney evaluation - eGFR measurement  05/04/2024   Medicare Annual Wellness (AWV)  05/11/2024   Pneumonia Vaccine 80+ Years old  Completed   HPV VACCINES  Aged Out   Hepatitis C Screening  Discontinued    Health Maintenance  Health Maintenance Due  Topic Date Due   DTaP/Tdap/Td (1 - Tdap) Never done   Zoster Vaccines- Shingrix (1 of 2) Never done   COVID-19 Vaccine (3 - 2023-24 season) 07/26/2022   OPHTHALMOLOGY EXAM  01/07/2023    Colorectal cancer screening: No longer required.   Lung Cancer Screening: (Low Dose CT Chest recommended if Age 12-80 years, 30 pack-year currently smoking OR have quit w/in 15years.) does not qualify.   Lung Cancer Screening Referral: no  Additional Screening:  Hepatitis C Screening: does not qualify; Completed yes  Vision Screening: Recommended annual ophthalmology exams for early detection of glaucoma and other disorders of the eye. Is the patient up to date with their annual eye exam?  Yes  Who is the provider or what is the name of the office in which the patient attends annual eye exams? [facility] If pt is not established with a provider, would they like to be referred to a provider to establish care? No .   Dental Screening: Recommended annual dental exams for proper oral hygiene  Community Resource Referral / Chronic Care Management: CRR required this visit?  No   CCM required this  visit?  No      Plan:     I have personally reviewed and noted the following in the patient's chart:   Medical and social history Use of alcohol, tobacco or illicit drugs  Current medications and supplements including opioid prescriptions. Patient is not currently taking opioid prescriptions. Functional ability and status Nutritional status Physical activity Advanced directives List of other physicians Hospitalizations, surgeries, and ER visits in previous 12 months Vitals Screenings to include cognitive, depression, and falls Referrals and appointments  In addition, I have reviewed and discussed with patient certain preventive protocols, quality metrics, and best practice recommendations. A written personalized care plan for preventive services as  well as general preventive health recommendations were provided to patient.     Sue Lush, LPN   07/23/5620   Nurse Notes: Pt relays he is lots of pain from going to a graduation on Saturday; relays he stayed in bed all day yesterday and has not gotten up today yet. He is waiting for his wife to return from work around noon. He states he has to use a walker to get up today as he is having a bit of back pain.He has no other concerns or questions during the visit.

## 2023-05-12 NOTE — Patient Instructions (Addendum)
Taylor [name] , Thank you for taking time to come for your Medicare Wellness Visit. I appreciate your ongoing commitment to your health goals. Please review the following plan we discussed and let me know if I can assist you in the future.   These are the goals we discussed:  Goals      Develop Plan of care for Management of CHF     Care Coordination Interventions: Basic overview and discussion of pathophysiology of Heart Failure reviewed Provided education on low sodium diet Reviewed Heart Failure Action Plan in depth and provided written copy Assessed need for readable accurate scales in home Advised patient to weigh each morning after emptying bladder Discussed importance of daily weight and advised patient to weigh and record daily Reviewed role of diuretics in prevention of fluid overload and management of heart failure; Readmitted to hospital Follow up appointments:  HF clinic on 6/4.  PCP is not until June for AWV     DIET - EAT MORE FRUITS AND VEGETABLES     DIET - REDUCE PORTION SIZE     Recommend decreasing portion sizes by half and eating 3 small meals a day with two healthy snacks in between.      Monitor and Manage My Blood Sugar-Diabetes Type 2     Timeframe:  Long-Range Goal Priority:  High Start Date:  01/23/2021                           Expected End Date: 07/27/2023                  Follow Up within 90 days   -Check blood sugar once daily, before breakfast  - check blood sugar if I feel it is too high or too low - enter blood sugar readings and medication or insulin into daily log    Why is this important?   Checking your blood sugar at home helps to keep it from getting very high or very low.  Writing the results in a diary or log helps the doctor know how to care for you.  Your blood sugar log should have the time, date and the results.  Also, write down the amount of insulin or other medicine that you take.  Other information, like what you ate, exercise done and how  you were feeling, will also be helpful.     Notes:         This is a list of the screening recommended for you and due dates:  Health Maintenance  Topic Date Due   DTaP/Tdap/Td vaccine (1 - Tdap) Never done   Zoster (Shingles) Vaccine (1 of 2) Never done   COVID-19 Vaccine (3 - 2023-24 season) 07/26/2022   Eye exam for diabetics  01/07/2023   Flu Shot  06/26/2023   Yearly kidney health urinalysis for diabetes  08/07/2023   Complete foot exam   08/07/2023   Hemoglobin A1C  11/04/2023   Yearly kidney function blood test for diabetes  05/04/2024   Medicare Annual Wellness Visit  05/11/2024   Pneumonia Vaccine  Completed   HPV Vaccine  Aged Out   Hepatitis C Screening  Discontinued    Advanced directives: yes  Conditions/risks identified: moderate falls risk  Next appointment: Follow up in one year for your annual wellness visit. 05/12/2024 @ 9:45am telephone  Preventive Care 65 Years and Older, Male  Preventive care refers to lifestyle choices and visits with your health care  provider that can promote health and wellness. What does preventive care include? A yearly physical exam. This is also called an annual well check. Dental exams once or twice a year. Routine eye exams. Ask your health care provider how often you should have your eyes checked. Personal lifestyle choices, including: Daily care of your teeth and gums. Regular physical activity. Eating a healthy diet. Avoiding tobacco and drug use. Limiting alcohol use. Practicing safe sex. Taking low doses of aspirin every day. Taking vitamin and mineral supplements as recommended by your health care provider. What happens during an annual well check? The services and screenings done by your health care provider during your annual well check will depend on your age, overall health, lifestyle risk factors, and family history of disease. Counseling  Your health care provider may ask you questions about your: Alcohol  use. Tobacco use. Drug use. Emotional well-being. Home and relationship well-being. Sexual activity. Eating habits. History of falls. Memory and ability to understand (cognition). Work and work Astronomer. Screening  You may have the following tests or measurements: Height, weight, and BMI. Blood pressure. Lipid and cholesterol levels. These may be checked every 5 years, or more frequently if you are over 70 years old. Skin check. Lung cancer screening. You may have this screening every year starting at age 44 if you have a 30-pack-year history of smoking and currently smoke or have quit within the past 15 years. Fecal occult blood test (FOBT) of the stool. You may have this test every year starting at age 95. Flexible sigmoidoscopy or colonoscopy. You may have a sigmoidoscopy every 5 years or a colonoscopy every 10 years starting at age 67. Prostate cancer screening. Recommendations will vary depending on your family history and other risks. Hepatitis C blood test. Hepatitis B blood test. Sexually transmitted disease (STD) testing. Diabetes screening. This is done by checking your blood sugar (glucose) after you have not eaten for a while (fasting). You may have this done every 1-3 years. Abdominal aortic aneurysm (AAA) screening. You may need this if you are a current or former smoker. Osteoporosis. You may be screened starting at age 20 if you are at high risk. Talk with your health care provider about your test results, treatment options, and if necessary, the need for more tests. Vaccines  Your health care provider may recommend certain vaccines, such as: Influenza vaccine. This is recommended every year. Tetanus, diphtheria, and acellular pertussis (Tdap, Td) vaccine. You may need a Td booster every 10 years. Zoster vaccine. You may need this after age 28. Pneumococcal 13-valent conjugate (PCV13) vaccine. One dose is recommended after age 63. Pneumococcal polysaccharide  (PPSV23) vaccine. One dose is recommended after age 27. Talk to your health care provider about which screenings and vaccines you need and how often you need them. This information is not intended to replace advice given to you by your health care provider. Make sure you discuss any questions you have with your health care provider. Document Released: 12/08/2015 Document Revised: 07/31/2016 Document Reviewed: 09/12/2015 Elsevier Interactive Patient Education  2017 [employer].  Fall Prevention in the Home Falls can cause injuries. They can happen to people of all ages. There are many things you can do to make your home safe and to help prevent falls. What can I do on the outside of my home? Regularly fix the edges of walkways and driveways and fix any cracks. Remove anything that might make you trip as you walk through a door, such as  a raised step or threshold. Trim any bushes or trees on the path to your home. Use bright outdoor lighting. Clear any walking paths of anything that might make someone trip, such as rocks or tools. Regularly check to see if handrails are loose or broken. Make sure that both sides of any steps have handrails. Any raised decks and porches should have guardrails on the edges. Have any leaves, snow, or ice cleared regularly. Use sand or salt on walking paths during winter. Clean up any spills in your garage right away. This includes oil or grease spills. What can I do in the bathroom? Use night lights. Install grab bars by the toilet and in the tub and shower. Do not use towel bars as grab bars. Use non-skid mats or decals in the tub or shower. If you need to sit down in the shower, use a plastic, non-slip stool. Keep the floor dry. Clean up any water that spills on the floor as soon as it happens. Remove soap buildup in the tub or shower regularly. Attach bath mats securely with double-sided non-slip rug tape. Do not have throw rugs and other things on the  floor that can make you trip. What can I do in the bedroom? Use night lights. Make sure that you have a light by your bed that is easy to reach. Do not use any sheets or blankets that are too big for your bed. They should not hang down onto the floor. Have a firm chair that has side arms. You can use this for support while you get dressed. Do not have throw rugs and other things on the floor that can make you trip. What can I do in the kitchen? Clean up any spills right away. Avoid walking on wet floors. Keep items that you use a lot in easy-to-reach places. If you need to reach something above you, use a strong step stool that has a grab bar. Keep electrical cords out of the way. Do not use floor polish or wax that makes floors slippery. If you must use wax, use non-skid floor wax. Do not have throw rugs and other things on the floor that can make you trip. What can I do with my stairs? Do not leave any items on the stairs. Make sure that there are handrails on both sides of the stairs and use them. Fix handrails that are broken or loose. Make sure that handrails are as long as the stairways. Check any carpeting to make sure that it is firmly attached to the stairs. Fix any carpet that is loose or worn. Avoid having throw rugs at the top or bottom of the stairs. If you do have throw rugs, attach them to the floor with carpet tape. Make sure that you have a light switch at the top of the stairs and the bottom of the stairs. If you do not have them, ask someone to add them for you. What else can I do to help prevent falls? Wear shoes that: Do not have high heels. Have rubber bottoms. Are comfortable and fit you well. Are closed at the toe. Do not wear sandals. If you use a stepladder: Make sure that it is fully opened. Do not climb a closed stepladder. Make sure that both sides of the stepladder are locked into place. Ask someone to hold it for you, if possible. Clearly mark and make  sure that you can see: Any grab bars or handrails. First and last steps. Where the edge  of each step is. Use tools that help you move around (mobility aids) if they are needed. These include: Canes. Walkers. Scooters. Crutches. Turn on the lights when you go into a dark area. Replace any light bulbs as soon as they burn out. Set up your furniture so you have a clear path. Avoid moving your furniture around. If any of your floors are uneven, fix them. If there are any pets around you, be aware of where they are. Review your medicines with your doctor. Some medicines can make you feel dizzy. This can increase your chance of falling. Ask your doctor what other things that you can do to help prevent falls. This information is not intended to replace advice given to you by your health care provider. Make sure you discuss any questions you have with your health care provider. Document Released: 09/07/2009 Document Revised: 04/18/2016 Document Reviewed: 12/16/2014 Elsevier Interactive Patient Education  2017 [employer].

## 2023-05-13 ENCOUNTER — Other Ambulatory Visit: Payer: Self-pay | Admitting: Family Medicine

## 2023-05-13 DIAGNOSIS — I495 Sick sinus syndrome: Secondary | ICD-10-CM | POA: Diagnosis not present

## 2023-05-15 DIAGNOSIS — I69898 Other sequelae of other cerebrovascular disease: Secondary | ICD-10-CM | POA: Diagnosis not present

## 2023-05-15 DIAGNOSIS — I501 Left ventricular failure: Secondary | ICD-10-CM | POA: Diagnosis not present

## 2023-05-15 DIAGNOSIS — J449 Chronic obstructive pulmonary disease, unspecified: Secondary | ICD-10-CM | POA: Diagnosis not present

## 2023-05-15 DIAGNOSIS — E1122 Type 2 diabetes mellitus with diabetic chronic kidney disease: Secondary | ICD-10-CM | POA: Diagnosis not present

## 2023-05-15 DIAGNOSIS — N184 Chronic kidney disease, stage 4 (severe): Secondary | ICD-10-CM | POA: Diagnosis not present

## 2023-05-15 DIAGNOSIS — I110 Hypertensive heart disease with heart failure: Secondary | ICD-10-CM | POA: Diagnosis not present

## 2023-05-19 ENCOUNTER — Encounter: Payer: Medicare Other | Attending: Cardiology | Admitting: *Deleted

## 2023-05-19 DIAGNOSIS — I5022 Chronic systolic (congestive) heart failure: Secondary | ICD-10-CM | POA: Insufficient documentation

## 2023-05-19 DIAGNOSIS — Z5189 Encounter for other specified aftercare: Secondary | ICD-10-CM | POA: Insufficient documentation

## 2023-05-21 ENCOUNTER — Encounter: Payer: Medicare Other | Admitting: *Deleted

## 2023-05-22 ENCOUNTER — Ambulatory Visit: Payer: Self-pay | Admitting: *Deleted

## 2023-05-22 DIAGNOSIS — I4901 Ventricular fibrillation: Secondary | ICD-10-CM | POA: Diagnosis not present

## 2023-05-22 DIAGNOSIS — I25810 Atherosclerosis of coronary artery bypass graft(s) without angina pectoris: Secondary | ICD-10-CM | POA: Diagnosis not present

## 2023-05-22 DIAGNOSIS — Z8679 Personal history of other diseases of the circulatory system: Secondary | ICD-10-CM | POA: Diagnosis not present

## 2023-05-22 DIAGNOSIS — Z95810 Presence of automatic (implantable) cardiac defibrillator: Secondary | ICD-10-CM | POA: Diagnosis not present

## 2023-05-22 DIAGNOSIS — I5023 Acute on chronic systolic (congestive) heart failure: Secondary | ICD-10-CM | POA: Diagnosis not present

## 2023-05-22 DIAGNOSIS — R001 Bradycardia, unspecified: Secondary | ICD-10-CM | POA: Diagnosis not present

## 2023-05-22 DIAGNOSIS — Z9889 Other specified postprocedural states: Secondary | ICD-10-CM | POA: Diagnosis not present

## 2023-05-22 DIAGNOSIS — I7143 Infrarenal abdominal aortic aneurysm, without rupture: Secondary | ICD-10-CM | POA: Diagnosis not present

## 2023-05-22 DIAGNOSIS — I255 Ischemic cardiomyopathy: Secondary | ICD-10-CM | POA: Diagnosis not present

## 2023-05-22 DIAGNOSIS — I10 Essential (primary) hypertension: Secondary | ICD-10-CM | POA: Diagnosis not present

## 2023-05-22 DIAGNOSIS — I5022 Chronic systolic (congestive) heart failure: Secondary | ICD-10-CM | POA: Diagnosis not present

## 2023-05-22 DIAGNOSIS — I480 Paroxysmal atrial fibrillation: Secondary | ICD-10-CM | POA: Diagnosis not present

## 2023-05-22 NOTE — Patient Outreach (Signed)
  Care Coordination   Follow Up Visit Note   05/22/2023 Name: Taylor [name] MRN: [id number] DOB: 1941/11/07  Taylor [name] is a 82 y.o. year old male who sees [name], Demetrios Isaacs, MD for primary care. I spoke with  Taylor [name] by phone today.  What matters to the patients health and wellness today?  Manage breathing (CHF/CKD)    Goals Addressed             This Visit's Progress    Develop Plan of care for Management of CHF   On track    Care Coordination Interventions: Basic overview and discussion of pathophysiology of Heart Failure reviewed Provided education on low sodium diet Reviewed Heart Failure Action Plan in depth and provided written copy Assessed need for readable accurate scales in home Advised patient to weigh each morning after emptying bladder Discussed importance of daily weight and advised patient to weigh and record daily Reviewed role of diuretics in prevention of fluid overload and management of heart failure; Readmitted to hospital - 5/19-6/4        SDOH assessments and interventions completed:  No     Care Coordination Interventions:  Yes, provided   Interventions Today    Flowsheet Row Most Recent Value  Chronic Disease   Chronic disease during today's visit Congestive Heart Failure (CHF)  [has some congestion, taking Mucinex, completed Levaquin]  General Interventions   General Interventions Discussed/Reviewed General Interventions Reviewed, Doctor Visits  [Confirms HH with [facility], will have cardiac rehab paused at this time until stronger]  Doctor Visits Discussed/Reviewed Doctor Visits Reviewed, PCP, Specialist, Annual Wellness Visits  [cardiology today, nephrology tomorrow]  PCP/Specialist Visits Compliance with follow-up visit  [AWV done 6/17, PCP done 6/17]  Exercise Interventions   Exercise Discussed/Reviewed Weight Managment  Weight Management Weight maintenance  [weight today 172 pounds, yesterday 171 pounds]  Education Interventions    Education Provided Provided Education  Provided Verbal Education On Medication, When to see the doctor, Other  [daily weights, BP monitoring (today 122/86, HR 62), oxygen sats 97% on RA.  Taking Torsemide, but does not have Spironolactone, encouraged to take meds with him to office visit today for review.]       Follow up plan: Follow up call scheduled for 7/12    Encounter Outcome:  Pt. Visit Completed   Kemper Durie, RN, MSN, [facility] Care Management Care Management Coordinator [phone]

## 2023-05-24 DIAGNOSIS — I110 Hypertensive heart disease with heart failure: Secondary | ICD-10-CM | POA: Diagnosis not present

## 2023-05-24 DIAGNOSIS — E1122 Type 2 diabetes mellitus with diabetic chronic kidney disease: Secondary | ICD-10-CM | POA: Diagnosis not present

## 2023-05-24 DIAGNOSIS — I69898 Other sequelae of other cerebrovascular disease: Secondary | ICD-10-CM | POA: Diagnosis not present

## 2023-05-24 DIAGNOSIS — N184 Chronic kidney disease, stage 4 (severe): Secondary | ICD-10-CM | POA: Diagnosis not present

## 2023-05-24 DIAGNOSIS — I501 Left ventricular failure: Secondary | ICD-10-CM | POA: Diagnosis not present

## 2023-05-24 DIAGNOSIS — J449 Chronic obstructive pulmonary disease, unspecified: Secondary | ICD-10-CM | POA: Diagnosis not present

## 2023-05-26 ENCOUNTER — Encounter: Payer: Medicare Other | Attending: Cardiology | Admitting: *Deleted

## 2023-05-26 DIAGNOSIS — I5022 Chronic systolic (congestive) heart failure: Secondary | ICD-10-CM | POA: Insufficient documentation

## 2023-05-26 DIAGNOSIS — Z5189 Encounter for other specified aftercare: Secondary | ICD-10-CM | POA: Insufficient documentation

## 2023-05-28 ENCOUNTER — Encounter: Payer: Medicare Other | Admitting: *Deleted

## 2023-05-30 ENCOUNTER — Other Ambulatory Visit: Payer: Self-pay | Admitting: Pharmacist

## 2023-05-30 NOTE — Progress Notes (Signed)
   05/30/2023  Patient ID: Taylor [name], male   DOB: [birth year], 82 y.o.   MRN: [id number]  Pharmacy Quality Measure Review  This patient is appearing on a report related to adherence measure for diabetes medications this calendar year.   Medication: Jardiance 10 mg   From review of chart, note patient is currently off of Jardiance as medication discontinued at hospital discharge on 04/29/2023. Note patient had reduced renal function and hypotension during admission.  - From review of latest note from [facility] on 05/22/2023, plan was to consider restarting empagliflozin after follow-up with Dr. Pilar Taylor. - Note next appointment with Nephrology scheduled for 07/09/2023  Estelle Grumbles, PharmD, [facility] [phone]

## 2023-06-01 DIAGNOSIS — J4489 Other specified chronic obstructive pulmonary disease: Secondary | ICD-10-CM | POA: Diagnosis not present

## 2023-06-03 ENCOUNTER — Encounter: Payer: Self-pay | Admitting: *Deleted

## 2023-06-03 DIAGNOSIS — J9621 Acute and chronic respiratory failure with hypoxia: Secondary | ICD-10-CM | POA: Diagnosis not present

## 2023-06-03 DIAGNOSIS — I5022 Chronic systolic (congestive) heart failure: Secondary | ICD-10-CM

## 2023-06-03 NOTE — Progress Notes (Signed)
Cardiac Individual Treatment Plan  Patient Details  Name: Taylor [name] MRN: [id number] Date of Birth: 08/26/1941 Referring Provider:   Flowsheet Row Cardiac Rehab from 04/10/2023 in [facility] Cardiac and Pulmonary Rehab  Referring Provider Dr. Marcina Millard, MD       Initial Encounter Date:  Flowsheet Row Cardiac Rehab from 04/10/2023 in [facility] Cardiac and Pulmonary Rehab  Date 04/10/23       Visit Diagnosis: Heart failure, chronic systolic (HCC)  Patient's Home Medications on Admission:  Current Outpatient Medications:    acetaminophen (TYLENOL) 500 MG tablet, Take 500 mg by mouth every 6 (six) hours as needed for mild pain., Disp: , Rfl:    albuterol (VENTOLIN HFA) 108 (90 Base) MCG/ACT inhaler, Inhale 2 puffs into the lungs every 6 (six) hours as needed for wheezing or shortness of breath., Disp: 8 g, Rfl: 2   allopurinol (ZYLOPRIM) 300 MG tablet, Take 150 mg by mouth daily., Disp: , Rfl:    apixaban (ELIQUIS) 2.5 MG TABS tablet, Take 1 tablet (2.5 mg total) by mouth 2 (two) times daily., Disp: 60 tablet, Rfl: 1   aspirin 81 MG EC tablet, Take 81 mg by mouth daily. , Disp: , Rfl:    atorvastatin (LIPITOR) 40 MG tablet, TAKE 1 TABLET(40 MG) BY MOUTH DAILY, Disp: 90 tablet, Rfl: 1   benzonatate (TESSALON) 200 MG capsule, Take 1 capsule (200 mg total) by mouth 3 (three) times daily as needed for cough., Disp: 20 capsule, Rfl: 0   carvedilol (COREG) 6.25 MG tablet, Take 1 tablet (6.25 mg total) by mouth 2 (two) times daily with a meal., Disp: 60 tablet, Rfl: 0   dextromethorphan-guaiFENesin (MUCINEX DM) 30-600 MG 12hr tablet, Take 1 tablet by mouth 2 (two) times daily as needed for cough., Disp: 30 tablet, Rfl: 0   ipratropium-albuterol (DUONEB) 0.5-2.5 (3) MG/3ML SOLN, Inhale 3 mLs into the lungs every 4 (four) hours as needed (wheezing, shob)., Disp: 360 mL, Rfl: 1   isosorbide mononitrate (IMDUR) 30 MG 24 hr tablet, TAKE 1/2 TABLET(15 MG) BY MOUTH DAILY (Patient taking differently:  Take 15 mg by mouth daily.), Disp: 45 tablet, Rfl: 2   Magnesium 200 MG TABS, Take 1 tablet (200 mg total) by mouth in the morning and at bedtime., Disp: 60 tablet, Rfl: 5   pantoprazole (PROTONIX) 40 MG tablet, TAKE 1 TABLET BY MOUTH EVERY DAY, Disp: 90 tablet, Rfl: 4   potassium chloride SA (KLOR-CON M) 20 MEQ tablet, Take 20 mEq by mouth daily., Disp: , Rfl:    spironolactone (ALDACTONE) 25 MG tablet, Take 1 tablet (25 mg total) by mouth daily., Disp: 30 tablet, Rfl: 1   torsemide 60 MG TABS, Take 60 mg by mouth 2 (two) times daily., Disp: 60 tablet, Rfl: 1   triamcinolone ointment (KENALOG) 0.1 %, APPLY TOPICALLY TWICE DAILY AS DIRECTED, Disp: 454 g, Rfl: 3  Past Medical History: Past Medical History:  Diagnosis Date   AAA (abdominal aortic aneurysm) (HCC) 06/03/2007   [facility]; Dr. Hart [place]   AICD (automatic cardioverter/defibrillator) present    Arrhythmia    atrial fibrillation   Barrett's esophagus    Bladder cancer (HCC)    Bradycardia    CAD (coronary artery disease)    CAP (community acquired pneumonia) 11/13/2019   CHF (congestive heart failure) (HCC)    Cluster headache    COVID-19 09/27/2021   DDD (degenerative disc disease), lumbar    Diabetes mellitus without complication (HCC)    Dyspnea  WITH EXERTION   Edema    LEFT ANKLE   Fracture of skull base (HCC) 1997   due to fall   GERD (gastroesophageal reflux disease)    Gout    History of bladder cancer 12/1995   Hyperlipidemia    Hypertension    Hypocalcemia 04/19/2020   Possibly secondary to diuretics.   Low=5.9 04/19/2020   Malignant melanoma (HCC) 12/2012   right dorsal forearm excised   Myocardial infarction [facility])    LAST 2014   Osteoarthritis of knee    Other specified complication of vascular prosthetic devices, implants and grafts, initial encounter (HCC) 08/22/2021   Pacemaker 10/10/2006   Pancreatitis 11/22/2019   Pneumonia    2016   Pre-diabetes    Psoriasis    Rib  fracture 1997   due to fall   Sleep apnea    CPAP   Stroke (HCC)    Venous incompetence     Tobacco Use: Social History   Tobacco Use  Smoking Status Former   Packs/day: 1.00   Years: 30.00   Additional pack years: 0.00   Total pack years: 30.00   Types: Cigarettes   Quit date: 11/26/1999   Years since quitting: 23.5  Smokeless Tobacco Never    Labs: Review Flowsheet  More data exists      Latest Ref Rng & Units 08/06/2022 12/24/2022 02/25/2023 04/23/2023 05/05/2023  Labs for ITP Cardiac and Pulmonary Rehab  Hemoglobin A1c 4.8 - 5.6 % 6.5  6.5  - - 7.1   Bicarbonate 20.0 - 28.0 mmol/L - - 28.2  32.9  -  O2 Saturation % - - 25  22.6  -     Exercise Target Goals: Exercise Program Goal: Individual exercise prescription set using results from initial 6 min walk test and THRR while considering  patient's activity barriers and safety.   Exercise Prescription Goal: Initial exercise prescription builds to 30-45 minutes a day of aerobic activity, 2-3 days per week.  Home exercise guidelines will be given to patient during program as part of exercise prescription that the participant will acknowledge.   Education: Aerobic Exercise: - Group verbal and visual presentation on the components of exercise prescription. Introduces F.I.T.T principle from ACSM for exercise prescriptions.  Reviews F.I.T.T. principles of aerobic exercise including progression. Written material given at graduation. Flowsheet Row Cardiac Rehab from 04/16/2023 in [facility] Cardiac and Pulmonary Rehab  Education need identified 04/10/23       Education: Resistance Exercise: - Group verbal and visual presentation on the components of exercise prescription. Introduces F.I.T.T principle from ACSM for exercise prescriptions  Reviews F.I.T.T. principles of resistance exercise including progression. Written material given at graduation.    Education: Exercise & Equipment Safety: - Individual verbal instruction and  demonstration of equipment use and safety with use of the equipment. Flowsheet Row Cardiac Rehab from 04/16/2023 in [facility] Cardiac and Pulmonary Rehab  Date 04/03/23  Educator [facility]  Instruction Review Code 1- Verbalizes Understanding       Education: Exercise Physiology & General Exercise Guidelines: - Group verbal and written instruction with models to review the exercise physiology of the cardiovascular system and associated critical values. Provides general exercise guidelines with specific guidelines to those with heart or lung disease.    Education: Flexibility, Balance, Mind/Body Relaxation: - Group verbal and visual presentation with interactive activity on the components of exercise prescription. Introduces F.I.T.T principle from ACSM for exercise prescriptions. Reviews F.I.T.T. principles of flexibility and balance exercise training including progression. Also discusses  the mind body connection.  Reviews various relaxation techniques to help reduce and manage stress (i.e. Deep breathing, progressive muscle relaxation, and visualization). Balance handout provided to take home. Written material given at graduation.   Activity Barriers & Risk Stratification:  Activity Barriers & Cardiac Risk Stratification - 04/10/23 1413       Activity Barriers & Cardiac Risk Stratification   Activity Barriers Arthritis;Balance Concerns    Cardiac Risk Stratification High             6 Minute Walk:  6 Minute Walk     Row Name 04/10/23 1412         6 Minute Walk   Phase Initial     Distance 765 feet     Walk Time 6 minutes     # of Rest Breaks 0     MPH 1.45     METS 1.26     RPE 15     Perceived Dyspnea  3     VO2 Peak 4.4     Symptoms Yes (comment)     Comments leg fatigue     Resting HR 67 bpm     Resting BP 102/50     Resting Oxygen Saturation  97 %     Exercise Oxygen Saturation  during 6 min walk 94 %     Max Ex. HR 95 bpm     Max Ex. BP 118/58     2 Minute Post BP  104/52              Oxygen Initial Assessment:  Oxygen Initial Assessment - 04/03/23 1005       Home Oxygen   Home Oxygen Device --    Sleep Oxygen Prescription --    Liters per minute --    Home Exercise Oxygen Prescription --    Home Resting Oxygen Prescription --    Compliance with Home Oxygen Use --      Initial 6 min Walk   Oxygen Used --      Program Oxygen Prescription   Program Oxygen Prescription --      Intervention   Short Term Goals --    Long  Term Goals --             Oxygen Re-Evaluation:   Oxygen Discharge (Final Oxygen Re-Evaluation):   Initial Exercise Prescription:  Initial Exercise Prescription - 04/10/23 1400       Date of Initial Exercise RX and Referring Provider   Date 04/10/23    Referring Provider Dr. Marcina Millard, MD      Oxygen   Maintain Oxygen Saturation 88% or higher      Treadmill   MPH 1    Grade 0    Minutes 15    METs 1.8      NuStep   Level 2    SPM 80    Minutes 15    METs 1.26      Biostep-RELP   Level 1    SPM 80    Minutes 15    METs 1.23      Prescription Details   Frequency (times per week) 2    Duration Progress to 30 minutes of continuous aerobic without signs/symptoms of physical distress      Intensity   THRR 40-80% of Max Heartrate 96-125    Ratings of Perceived Exertion 11-13    Perceived Dyspnea 0-4      Progression   Progression Continue to progress workloads to  maintain intensity without signs/symptoms of physical distress.      Resistance Training   Training Prescription Yes    Weight 3 lb    Reps 10-15             Perform Capillary Blood Glucose checks as needed.  Exercise Prescription Changes:   Exercise Prescription Changes     Row Name 04/10/23 1400 04/23/23 1600 05/07/23 0700         Response to Exercise   Blood Pressure (Admit) 102/50 102/60 102/64     Blood Pressure (Exercise) 118/58 128/66 118/74     Blood Pressure (Exit) 104/52 110/64 100/58      Heart Rate (Admit) 67 bpm 77 bpm 77 bpm     Heart Rate (Exercise) 95 bpm 107 bpm 97 bpm     Heart Rate (Exit) 72 bpm 67 bpm 67 bpm     Oxygen Saturation (Admit) 97 % -- --     Oxygen Saturation (Exercise) 94 % -- --     Rating of Perceived Exertion (Exercise) 15 13 13      Perceived Dyspnea (Exercise) 3 -- --     Symptoms leg fatigue none none     Comments Results third full day of exercise --     Duration -- Progress to 30 minutes of  aerobic without signs/symptoms of physical distress Progress to 30 minutes of  aerobic without signs/symptoms of physical distress     Intensity -- THRR unchanged THRR unchanged       Progression   Progression -- Continue to progress workloads to maintain intensity without signs/symptoms of physical distress. Continue to progress workloads to maintain intensity without signs/symptoms of physical distress.     Average METs -- 2.18 1.89       Resistance Training   Training Prescription -- Yes Yes     Weight -- 3 lb 3 lb     Reps -- 10-15 10-15       Interval Training   Interval Training -- No No       Treadmill   MPH -- 1 1     Grade -- 0 0     Minutes -- 15 15     METs -- 1.77 1.77       NuStep   Level -- 2 3     Minutes -- 15 15     METs -- 2.6 2       Oxygen   Maintain Oxygen Saturation -- 88% or higher 88% or higher              Exercise Comments:   Exercise Comments     Row Name 04/14/23 1034           Exercise Comments First full day of exercise!  Patient was oriented to gym and equipment including functions, settings, policies, and procedures.  Patient's individual exercise prescription and treatment plan were reviewed.  All starting workloads were established based on the results of the 6 minute walk test done at initial orientation visit.  The plan for exercise progression was also introduced and progression will be customized based on patient's performance and goals.                Exercise Goals and  Review:   Exercise Goals     Row Name 04/10/23 1415             Exercise Goals   Increase Physical Activity Yes       Intervention  Provide advice, education, support and counseling about physical activity/exercise needs.;Develop an individualized exercise prescription for aerobic and resistive training based on initial evaluation findings, risk stratification, comorbidities and participant's personal goals.       Expected Outcomes Short Term: Attend rehab on a regular basis to increase amount of physical activity.;Long Term: Add in home exercise to make exercise part of routine and to increase amount of physical activity.;Long Term: Exercising regularly at least 3-5 days a week.       Increase Strength and Stamina Yes       Intervention Provide advice, education, support and counseling about physical activity/exercise needs.;Develop an individualized exercise prescription for aerobic and resistive training based on initial evaluation findings, risk stratification, comorbidities and participant's personal goals.       Expected Outcomes Short Term: Increase workloads from initial exercise prescription for resistance, speed, and METs.;Short Term: Perform resistance training exercises routinely during rehab and add in resistance training at home;Long Term: Improve cardiorespiratory fitness, muscular endurance and strength as measured by increased METs and functional capacity ( )       Able to understand and use rate of perceived exertion (RPE) scale Yes       Intervention Provide education and explanation on how to use RPE scale       Expected Outcomes Short Term: Able to use RPE daily in rehab to express subjective intensity level;Long Term:  Able to use RPE to guide intensity level when exercising independently       Able to understand and use Dyspnea scale Yes       Intervention Provide education and explanation on how to use Dyspnea scale       Expected Outcomes Short Term: Able to use  Dyspnea scale daily in rehab to express subjective sense of shortness of breath during exertion;Long Term: Able to use Dyspnea scale to guide intensity level when exercising independently       Knowledge and understanding of Target Heart Rate Range (THRR) Yes       Intervention Provide education and explanation of THRR including how the numbers were predicted and where they are located for reference       Expected Outcomes Short Term: Able to state/look up THRR;Long Term: Able to use THRR to govern intensity when exercising independently;Short Term: Able to use daily as guideline for intensity in rehab       Able to check pulse independently Yes       Intervention Provide education and demonstration on how to check pulse in carotid and radial arteries.;Review the importance of being able to check your own pulse for safety during independent exercise       Expected Outcomes Short Term: Able to explain why pulse checking is important during independent exercise;Long Term: Able to check pulse independently and accurately       Understanding of Exercise Prescription Yes       Intervention Provide education, explanation, and written materials on patient's individual exercise prescription       Expected Outcomes Short Term: Able to explain program exercise prescription;Long Term: Able to explain home exercise prescription to exercise independently                Exercise Goals Re-Evaluation :  Exercise Goals Re-Evaluation     Row Name 04/14/23 1034 04/23/23 1609 05/07/23 0739 05/21/23 1329       Exercise Goal Re-Evaluation   Exercise Goals Review Able to understand and use rate of perceived exertion (RPE) scale;Able to understand  and use Dyspnea scale;Knowledge and understanding of Target Heart Rate Range (THRR);Understanding of Exercise Prescription Increase Physical Activity;Increase Strength and Stamina;Understanding of Exercise Prescription Increase Physical Activity;Increase Strength and  Stamina;Understanding of Exercise Prescription Increase Physical Activity;Increase Strength and Stamina;Understanding of Exercise Prescription    Comments Reviewed RPE scale, THR and program prescription with pt today.  Pt voiced understanding and was given a copy of goals to take home. Sam is off to a good start in rehab.  He has completed his first three full day sessions thus far.  He has been able to do the workloads and already 2.6 METs on the NuStep.  We will continue to encourage consistent attendance and monitor his progress. Sam has only attended rehab once since the last review due to being in the hospital. He was able to improve to level 3 on the T4 nustep and continue to walk the treadmill at a speed of 1 mph with no incline. We will continue to monitor his progress when he returns to the program. Sam has not attended rehab since last review. He had been hospitalized due to his heart failure. We will wait to hear back from him on when he plans to return to the program.    Expected Outcomes Short: Use RPE daily to regulate intensity. Long: Follow program prescription in THR. Short: Continue to attend rehab regularly Long: Continue to follow program prescription Short: Return to rehab when appropriate. Long: Continue to improve strength and stamina. Short: Return to rehab when appropriate. Long: Continue to improve strength and stamina.             Discharge Exercise Prescription (Final Exercise Prescription Changes):  Exercise Prescription Changes - 05/07/23 0700       Response to Exercise   Blood Pressure (Admit) 102/64    Blood Pressure (Exercise) 118/74    Blood Pressure (Exit) 100/58    Heart Rate (Admit) 77 bpm    Heart Rate (Exercise) 97 bpm    Heart Rate (Exit) 67 bpm    Rating of Perceived Exertion (Exercise) 13    Symptoms none    Duration Progress to 30 minutes of  aerobic without signs/symptoms of physical distress    Intensity THRR unchanged      Progression    Progression Continue to progress workloads to maintain intensity without signs/symptoms of physical distress.    Average METs 1.89      Resistance Training   Training Prescription Yes    Weight 3 lb    Reps 10-15      Interval Training   Interval Training No      Treadmill   MPH 1    Grade 0    Minutes 15    METs 1.77      NuStep   Level 3    Minutes 15    METs 2      Oxygen   Maintain Oxygen Saturation 88% or higher             Nutrition:  Target Goals: Understanding of nutrition guidelines, daily intake of sodium 1500mg , cholesterol 200mg , calories 30% from fat and 7% or less from saturated fats, daily to have 5 or more servings of fruits and vegetables.  Education: All About Nutrition: -Group instruction provided by verbal, written material, interactive activities, discussions, models, and posters to present general guidelines for heart healthy nutrition including fat, fiber, MyPlate, the role of sodium in heart healthy nutrition, utilization of the nutrition label, and utilization of this knowledge  for meal planning. Follow up email sent as well. Written material given at graduation. Flowsheet Row Cardiac Rehab from 04/16/2023 in [facility] Cardiac and Pulmonary Rehab  Education need identified 04/10/23       Biometrics:  Pre Biometrics - 04/10/23 1416       Pre Biometrics   Height 5' 8.5" (1.74 m)    Weight 188 lb 14.4 oz (85.7 kg)    Waist Circumference 44 inches    Hip Circumference 40 inches    Waist to Hip Ratio 1.1 %    BMI (Calculated) 28.3    Single Leg Stand 3.3 seconds              Nutrition Therapy Plan and Nutrition Goals:  Nutrition Therapy & Goals - 04/10/23 1401       Intervention Plan   Intervention Prescribe, educate and counsel regarding individualized specific dietary modifications aiming towards targeted core components such as weight, hypertension, lipid management, diabetes, heart failure and other comorbidities.    Expected  Outcomes Short Term Goal: Understand basic principles of dietary content, such as calories, fat, sodium, cholesterol and nutrients.;Short Term Goal: A plan has been developed with personal nutrition goals set during dietitian appointment.;Long Term Goal: Adherence to prescribed nutrition plan.             Nutrition Assessments:  MEDIFICTS Score Key: ?70 Need to make dietary changes  40-70 Heart Healthy Diet ? 40 Therapeutic Level Cholesterol Diet  Flowsheet Row Cardiac Rehab from 04/10/2023 in [facility] Cardiac and Pulmonary Rehab  Picture Your Plate Total Score on Admission 60      Picture Your Plate Scores: <16 Unhealthy dietary pattern with much room for improvement. 41-50 Dietary pattern unlikely to meet recommendations for good health and room for improvement. 51-60 More healthful dietary pattern, with some room for improvement.  >60 Healthy dietary pattern, although there may be some specific behaviors that could be improved.    Nutrition Goals Re-Evaluation:   Nutrition Goals Discharge (Final Nutrition Goals Re-Evaluation):   Psychosocial: Target Goals: Acknowledge presence or absence of significant depression and/or stress, maximize coping skills, provide positive support system. Participant is able to verbalize types and ability to use techniques and skills needed for reducing stress and depression.   Education: Stress, Anxiety, and Depression - Group verbal and visual presentation to define topics covered.  Reviews how body is impacted by stress, anxiety, and depression.  Also discusses healthy ways to reduce stress and to treat/manage anxiety and depression.  Written material given at graduation. Flowsheet Row Cardiac Rehab from 04/16/2023 in [facility] Cardiac and Pulmonary Rehab  Date 04/16/23  Educator SB  Instruction Review Code 1- [employer] Understanding       Education: Sleep Hygiene -Provides group verbal and written instruction about how sleep can affect your  health.  Define sleep hygiene, discuss sleep cycles and impact of sleep habits. Review good sleep hygiene tips.    Initial Review & Psychosocial Screening:  Initial Psych Review & Screening - 04/03/23 1013       Initial Review   Current issues with None Identified;Current Stress Concerns    Source of Stress Concerns Chronic Illness      Family Dynamics   Comments He can look to his wife for support. He states no depression or anxiety and does not take anything for his mood.      Barriers   Psychosocial barriers to participate in program The patient should benefit from training in stress management and relaxation.  Screening Interventions   Interventions To provide support and resources with identified psychosocial needs;Provide feedback about the scores to participant;Encouraged to exercise    Expected Outcomes Short Term goal: Utilizing psychosocial counselor, staff and physician to assist with identification of specific Stressors or current issues interfering with healing process. Setting desired goal for each stressor or current issue identified.;Long Term Goal: Stressors or current issues are controlled or eliminated.;Short Term goal: Identification and review with participant of any Quality of Life or Depression concerns found by scoring the questionnaire.;Long Term goal: The participant improves quality of Life and PHQ9 Scores as seen by post scores and/or verbalization of changes             Quality of Life Scores:   Quality of Life - 04/10/23 1411       Quality of Life   Select Quality of Life      Quality of Life Scores   Health/Function Pre 24.97 %    Socioeconomic Pre 18.93 %    Psych/Spiritual Pre 22.93 %    Family Pre 23.8 %    GLOBAL Pre 23.13 %            Scores of 19 and below usually indicate a poorer quality of life in these areas.  A difference of  2-3 points is a clinically meaningful difference.  A difference of 2-3 points in the total score of  the Quality of Life Index has been associated with significant improvement in overall quality of life, self-image, physical symptoms, and general health in studies assessing change in quality of life.  PHQ-9: Review Flowsheet  More data exists      05/12/2023 05/05/2023 04/10/2023 01/03/2023 12/24/2022  Depression screen PHQ 2/9  Decreased Interest 1 2 2 2 1   Down, Depressed, Hopeless 1 2 2 2 2   PHQ - 2 Score 2 4 4 4 3   Altered sleeping 0 0 0 1 0  Tired, decreased energy 1 2 2 3 3   Change in appetite 1 1 0 1 1  Feeling bad or failure about yourself  1 1 2 3 2   Trouble concentrating 0 1 1 1 2   Moving slowly or fidgety/restless 0 2 2 2 3   Suicidal thoughts 0 1 0 1 1  PHQ-9 Score 5 12 11 16 15   Difficult doing work/chores Not difficult at all Somewhat difficult Somewhat difficult Somewhat difficult Somewhat difficult   Interpretation of Total Score  Total Score Depression Severity:  1-4 = Minimal depression, 5-9 = Mild depression, 10-14 = Moderate depression, 15-19 = Moderately severe depression, 20-27 = Severe depression   Psychosocial Evaluation and Intervention:  Psychosocial Evaluation - 04/03/23 1013       Psychosocial Evaluation & Interventions   Interventions Relaxation education;Encouraged to exercise with the program and follow exercise prescription;Stress management education    Comments He can look to his wife for support. He states no depression or anxiety and does not take anything for his mood.    Expected Outcomes Short: Start [facility] to help with mood. Long: Maintain a healthy mental state    Continue Psychosocial Services  Follow up required by staff             Psychosocial Re-Evaluation:   Psychosocial Discharge (Final Psychosocial Re-Evaluation):   Vocational Rehabilitation: Provide vocational rehab assistance to qualifying candidates.   Vocational Rehab Evaluation & Intervention:   Education: Education Goals: Education classes will be provided on a  variety of topics geared toward better understanding of heart health  and risk factor modification. Participant will state understanding/return demonstration of topics presented as noted by education test scores.  Learning Barriers/Preferences:  Learning Barriers/Preferences - 04/03/23 1051       Learning Barriers/Preferences   Learning Barriers Hearing    Learning Preferences None             General Cardiac Education Topics:  AED/CPR: - Group verbal and written instruction with the use of models to demonstrate the basic use of the AED with the basic ABC's of resuscitation.   Anatomy and Cardiac Procedures: - Group verbal and visual presentation and models provide information about basic cardiac anatomy and function. Reviews the testing methods done to diagnose heart disease and the outcomes of the test results. Describes the treatment choices: Medical Management, Angioplasty, or Coronary Bypass Surgery for treating various heart conditions including Myocardial Infarction, Angina, Valve Disease, and Cardiac Arrhythmias.  Written material given at graduation. Flowsheet Row Cardiac Rehab from 04/16/2023 in [facility] Cardiac and Pulmonary Rehab  Education need identified 04/10/23       Medication Safety: - Group verbal and visual instruction to review commonly prescribed medications for heart and lung disease. Reviews the medication, class of the drug, and side effects. Includes the steps to properly store meds and maintain the prescription regimen.  Written material given at graduation.   Intimacy: - Group verbal instruction through game format to discuss how heart and lung disease can affect sexual intimacy. Written material given at graduation..   Know Your Numbers and Heart Failure: - Group verbal and visual instruction to discuss disease risk factors for cardiac and pulmonary disease and treatment options.  Reviews associated critical values for Overweight/Obesity, Hypertension,  Cholesterol, and Diabetes.  Discusses basics of heart failure: signs/symptoms and treatments.  Introduces Heart Failure Zone chart for action plan for heart failure.  Written material given at graduation. Flowsheet Row Cardiac Rehab from 04/16/2023 in [facility] Cardiac and Pulmonary Rehab  Education need identified 04/10/23       Infection Prevention: - Provides verbal and written material to individual with discussion of infection control including proper hand washing and proper equipment cleaning during exercise session. Flowsheet Row Cardiac Rehab from 04/16/2023 in [facility] Cardiac and Pulmonary Rehab  Date 04/03/23  Educator [facility]  Instruction Review Code 1- Verbalizes Understanding       Falls Prevention: - Provides verbal and written material to individual with discussion of falls prevention and safety. Flowsheet Row Cardiac Rehab from 04/16/2023 in [facility] Cardiac and Pulmonary Rehab  Date 04/03/23  Educator [facility]  Instruction Review Code 1- Verbalizes Understanding       Other: -Provides group and verbal instruction on various topics (see comments)   Knowledge Questionnaire Score:  Knowledge Questionnaire Score - 04/10/23 1401       Knowledge Questionnaire Score   Pre Score 18/26             Core Components/Risk Factors/Patient Goals at Admission:  Personal Goals and Risk Factors at Admission - 04/10/23 1412       Core Components/Risk Factors/Patient Goals on Admission    Weight Management Yes;Weight Loss    Intervention Weight Management: Provide education and appropriate resources to help participant work on and attain dietary goals.;Weight Management: Develop a combined nutrition and exercise program designed to reach desired caloric intake, while maintaining appropriate intake of nutrient and fiber, sodium and fats, and appropriate energy expenditure required for the weight goal.;Weight Management/Obesity: Establish reasonable short term and long term weight goals.;Obesity:  Provide education and  appropriate resources to help participant work on and attain dietary goals.    Admit Weight 188 lb 14.4 oz (85.7 kg)    Goal Weight: Short Term 183 lb (83 kg)    Goal Weight: Long Term 178 lb (80.7 kg)    Expected Outcomes Short Term: Continue to assess and modify interventions until short term weight is achieved;Long Term: Adherence to nutrition and physical activity/exercise program aimed toward attainment of established weight goal;Weight Loss: Understanding of general recommendations for a balanced deficit meal plan, which promotes 1-2 lb weight loss per week and includes a negative energy balance of [phone] kcal/d;Understanding recommendations for meals to include 15-35% energy as protein, 25-35% energy from fat, 35-60% energy from carbohydrates, less than 200mg  of dietary cholesterol, 20-35 gm of total fiber daily;Understanding of distribution of calorie intake throughout the day with the consumption of 4-5 meals/snacks    Diabetes Yes    Intervention Provide education about signs/symptoms and action to take for hypo/hyperglycemia.;Provide education about proper nutrition, including hydration, and aerobic/resistive exercise prescription along with prescribed medications to achieve blood glucose in normal ranges: Fasting glucose 65-99 mg/dL    Expected Outcomes Long Term: Attainment of HbA1C < 7%.;Short Term: Participant verbalizes understanding of the signs/symptoms and immediate care of hyper/hypoglycemia, proper foot care and importance of medication, aerobic/resistive exercise and nutrition plan for blood glucose control.    Heart Failure Yes    Intervention Provide a combined exercise and nutrition program that is supplemented with education, support and counseling about heart failure. Directed toward relieving symptoms such as shortness of breath, decreased exercise tolerance, and extremity edema.    Expected Outcomes Improve functional capacity of life;Short term:  Attendance in program 2-3 days a week with increased exercise capacity. Reported lower sodium intake. Reported increased fruit and vegetable intake. Reports medication compliance.;Short term: Daily weights obtained and reported for increase. Utilizing diuretic protocols set by physician.;Long term: Adoption of self-care skills and reduction of barriers for early signs and symptoms recognition and intervention leading to self-care maintenance.    Hypertension Yes    Intervention Provide education on lifestyle modifcations including regular physical activity/exercise, weight management, moderate sodium restriction and increased consumption of fresh fruit, vegetables, and low fat dairy, alcohol moderation, and smoking cessation.;Monitor prescription use compliance.    Expected Outcomes Short Term: Continued assessment and intervention until BP is < 140/12mm HG in hypertensive participants. < 130/20mm HG in hypertensive participants with diabetes, heart failure or chronic kidney disease.;Long Term: Maintenance of blood pressure at goal levels.    Lipids Yes    Intervention Provide education and support for participant on nutrition & aerobic/resistive exercise along with prescribed medications to achieve LDL 70mg , HDL >40mg .    Expected Outcomes Short Term: Participant states understanding of desired cholesterol values and is compliant with medications prescribed. Participant is following exercise prescription and nutrition guidelines.;Long Term: Cholesterol controlled with medications as prescribed, with individualized exercise RX and with personalized nutrition plan. Value goals: LDL < 70mg , HDL > 40 mg.             Education:Diabetes - Individual verbal and written instruction to review signs/symptoms of diabetes, desired ranges of glucose level fasting, after meals and with exercise. Acknowledge that pre and post exercise glucose checks will be done for 3 sessions at entry of program. Flowsheet Row  Cardiac Rehab from 04/16/2023 in [facility] Cardiac and Pulmonary Rehab  Date 04/03/23  Educator [facility]  Instruction Review Code 1- Verbalizes Understanding       Core  Components/Risk Factors/Patient Goals Review:    Core Components/Risk Factors/Patient Goals at Discharge (Final Review):    ITP Comments:  ITP Comments     Row Name 04/03/23 1038 04/10/23 1357 04/14/23 1033 05/01/23 0937 05/07/23 1120   ITP Comments Visit completed. Patient informed on EP and RD appointment and 6 Minute walk test. Patient also informed of patient health questionnaires on My Chart. Patient Verbalizes understanding. Visit diagnosis can be found in [facility] 02/25/2023. Patient came in with mild shortness of breath. He states it was the walk down from his car. Checked patients oxygen 94% and heart rate 63. He managed to get his breath back a little with pursed lipped breathing. Ask the patient a few more questions and it was clear his shortness of breath was not getting better. He was willing to go the [facility] walk in clinic. When transitioning him to a different wheel chair he was more short of breath and oxygen saturations were going into the 80s. Informed staff that he was feeling sick the past few days and states his weight has increased. Staff at [facility] took him to the ER. Completed and gym orientation. Initial ITP created and sent for review to Dr. Bethann Punches, Medical Director. First full day of exercise!  Patient was oriented to gym and equipment including functions, settings, policies, and procedures.  Patient's individual exercise prescription and treatment plan were reviewed.  All starting workloads were established based on the results of the 6 minute walk test done at initial orientation visit.  The plan for exercise progression was also introduced and progression will be customized based on patient's performance and goals. Pt was readmitted on 5/29 after fall and had more fluid build up and pnuemonia.  He was  discharged on 6/4.  He has follow up appt scheduled for 6/10.  He will need clearance from doctor prior to returning to rehab.  We have been unable to assess for goals at this time. 30 Day review completed. Medical Director ITP review done, changes made as directed, and signed approval by Medical Director.   out for medical reasons    Row Name 06/03/23 1454           ITP Comments 30 Day review completed. Medical Director ITP review done, changes made as directed, and signed approval by Medical Director.   last visit in [facility]                Comments:

## 2023-06-05 DIAGNOSIS — J9621 Acute and chronic respiratory failure with hypoxia: Secondary | ICD-10-CM | POA: Diagnosis not present

## 2023-06-06 ENCOUNTER — Ambulatory Visit: Payer: Self-pay | Admitting: *Deleted

## 2023-06-06 NOTE — Patient Outreach (Signed)
  Care Coordination   Follow Up Visit Note   06/06/2023 Name: Taylor [name] MRN: [id number] DOB: [birth year]  Taylor [name] is a 82 y.o. year old male who sees [name], Demetrios Isaacs, MD for primary care. I spoke with  Taylor [name] by phone today.  What matters to the patients health and wellness today?  Keep CHF controlled    Goals Addressed             This Visit's Progress    Develop Plan of care for Management of CHF   On track    Care Coordination Interventions: Basic overview and discussion of pathophysiology of Heart Failure reviewed Provided education on low sodium diet Reviewed Heart Failure Action Plan in depth and provided written copy Assessed need for readable accurate scales in home Advised patient to weigh each morning after emptying bladder Discussed importance of daily weight and advised patient to weigh and record daily Reviewed role of diuretics in prevention of fluid overload and management of heart failure;         SDOH assessments and interventions completed:  No     Care Coordination Interventions:  Yes, provided   Interventions Today    Flowsheet Row Most Recent Value  Chronic Disease   Chronic disease during today's visit Congestive Heart Failure (CHF)  General Interventions   General Interventions Discussed/Reviewed General Interventions Reviewed, Doctor Visits, Communication with  Doctor Visits Discussed/Reviewed Doctor Visits Reviewed, PCP, Specialist  [Cardiology 7/30, Nephrology 8/14, PCP 9/20]  PCP/Specialist Visits Compliance with follow-up visit  Communication with PCP/Specialists  [Call placed to cardiology office for instructions for Torsemide dose, message left with nurse, waiting on call back]  Exercise Interventions   Exercise Discussed/Reviewed Weight Managment  Weight Management Weight maintenance  [Weight today 162 pounds]  Education Interventions   Education Provided Provided Education  Provided Verbal Education On Medication, When  to see the doctor, Other  [[facility] on hold, waiting for clearance from nephrology.  Discussed Torsemide dose, taking 60mg  twice a day, however prescription state 60mg  every am and 40 every pm. Per cards note, documented taking 60mg  daily. Waiting for instructions from card]       Follow up plan: Follow up call scheduled for 7/19    Encounter Outcome:  Pt. Visit Completed   Kemper Durie, RN, MSN, [facility] Care Management Care Management Coordinator [phone]

## 2023-06-09 ENCOUNTER — Telehealth: Payer: Self-pay | Admitting: *Deleted

## 2023-06-09 ENCOUNTER — Encounter: Payer: Self-pay | Admitting: *Deleted

## 2023-06-09 NOTE — Telephone Encounter (Signed)
Attempted to call pt to follow up with him. His last cardiac rehab session attended was 5/29. He was admitted same day for acute on chronic CHF and ascites. No answer when called pt. Unable to leave voicemail. Will try to reach pt at a later time.

## 2023-06-10 ENCOUNTER — Telehealth: Payer: Self-pay | Admitting: *Deleted

## 2023-06-10 NOTE — Patient Outreach (Signed)
  Care Coordination   Follow Up Visit Note   06/10/2023 Name: Taylor [name] MRN: [id number] DOB: 01/12/1941  Taylor [name] is a 82 y.o. year old male who sees [name], Demetrios Isaacs, MD for primary care.   What matters to the patients health and wellness today?  Clarification of Torsemide dose.  Attempted to call patient after clarification with MD office, message left.      Goals Addressed             This Visit's Progress    Develop Plan of care for Management of CHF   On track    Care Coordination Interventions: Basic overview and discussion of pathophysiology of Heart Failure reviewed Provided education on low sodium diet Reviewed Heart Failure Action Plan in depth and provided written copy Assessed need for readable accurate scales in home Advised patient to weigh each morning after emptying bladder Discussed importance of daily weight and advised patient to weigh and record daily Reviewed role of diuretics in prevention of fluid overload and management of heart failure;         SDOH assessments and interventions completed:  No     Care Coordination Interventions:  Yes, provided   Interventions Today    Flowsheet Row Most Recent Value  Chronic Disease   Chronic disease during today's visit Congestive Heart Failure (CHF)  General Interventions   General Interventions Discussed/Reviewed Communication with  Communication with PCP/Specialists  [Call received back from cardiology office, notified to tell patient to take Torsemide 60 mg daily]       Follow up plan: Follow up call scheduled for 7/19    Encounter Outcome:  Pt. Visit Completed  ' Kemper Durie, RN, MSN, [facility] Care Management Care Management Coordinator [phone]

## 2023-06-12 ENCOUNTER — Encounter: Payer: Self-pay | Admitting: *Deleted

## 2023-06-12 NOTE — Telephone Encounter (Signed)
Unable to reach pt. Pt has not attended cardiac rehab since 5/29. Letter mailed to pt. Discharge date set for 8/1.

## 2023-06-13 ENCOUNTER — Ambulatory Visit: Payer: Self-pay | Admitting: *Deleted

## 2023-06-13 NOTE — Patient Outreach (Signed)
  Care Coordination   06/13/2023 Name: Taylor [name] MRN: [id number] DOB: [birth year]   Care Coordination Outreach Attempts:  An unsuccessful telephone outreach was attempted for a scheduled appointment today.  Follow Up Plan:  Additional outreach attempts will be made to offer the patient care coordination information and services.   Encounter Outcome:  No Answer   Care Coordination Interventions:  No, not indicated    Kemper Durie, RN, MSN, [facility] Care Management Care Management Coordinator [phone]

## 2023-06-20 DIAGNOSIS — I700 Atherosclerosis of aorta: Secondary | ICD-10-CM | POA: Diagnosis not present

## 2023-06-20 DIAGNOSIS — E1122 Type 2 diabetes mellitus with diabetic chronic kidney disease: Secondary | ICD-10-CM | POA: Diagnosis not present

## 2023-06-20 DIAGNOSIS — N184 Chronic kidney disease, stage 4 (severe): Secondary | ICD-10-CM | POA: Diagnosis not present

## 2023-06-20 DIAGNOSIS — I501 Left ventricular failure: Secondary | ICD-10-CM | POA: Diagnosis not present

## 2023-06-20 DIAGNOSIS — I25119 Atherosclerotic heart disease of native coronary artery with unspecified angina pectoris: Secondary | ICD-10-CM | POA: Diagnosis not present

## 2023-06-20 DIAGNOSIS — J8489 Other specified interstitial pulmonary diseases: Secondary | ICD-10-CM | POA: Diagnosis not present

## 2023-06-20 DIAGNOSIS — J449 Chronic obstructive pulmonary disease, unspecified: Secondary | ICD-10-CM | POA: Diagnosis not present

## 2023-06-20 DIAGNOSIS — H539 Unspecified visual disturbance: Secondary | ICD-10-CM | POA: Diagnosis not present

## 2023-06-20 DIAGNOSIS — I714 Abdominal aortic aneurysm, without rupture, unspecified: Secondary | ICD-10-CM | POA: Diagnosis not present

## 2023-06-20 DIAGNOSIS — I69898 Other sequelae of other cerebrovascular disease: Secondary | ICD-10-CM | POA: Diagnosis not present

## 2023-06-20 DIAGNOSIS — I110 Hypertensive heart disease with heart failure: Secondary | ICD-10-CM | POA: Diagnosis not present

## 2023-06-20 DIAGNOSIS — I4891 Unspecified atrial fibrillation: Secondary | ICD-10-CM | POA: Diagnosis not present

## 2023-06-24 DIAGNOSIS — I255 Ischemic cardiomyopathy: Secondary | ICD-10-CM | POA: Diagnosis not present

## 2023-06-24 DIAGNOSIS — N183 Chronic kidney disease, stage 3 unspecified: Secondary | ICD-10-CM | POA: Diagnosis not present

## 2023-06-24 DIAGNOSIS — I5022 Chronic systolic (congestive) heart failure: Secondary | ICD-10-CM | POA: Diagnosis not present

## 2023-06-25 ENCOUNTER — Encounter: Payer: Self-pay | Admitting: *Deleted

## 2023-06-25 DIAGNOSIS — I5022 Chronic systolic (congestive) heart failure: Secondary | ICD-10-CM

## 2023-06-25 NOTE — Progress Notes (Unsigned)
Cardiac Individual Treatment Plan  Patient Details  Name: Taylor [name] MRN: [id number] Date of Birth: [birth year] Referring Provider:   Flowsheet Row Cardiac Rehab from 04/10/2023 in [facility] Cardiac and Pulmonary Rehab  Referring Provider Dr. Marcina Millard, MD       Initial Encounter Date:  Flowsheet Row Cardiac Rehab from 04/10/2023 in [facility] Cardiac and Pulmonary Rehab  Date 04/10/23       Visit Diagnosis: Heart failure, chronic systolic (HCC)  Patient's Home Medications on Admission:  Current Outpatient Medications:    acetaminophen (TYLENOL) 500 MG tablet, Take 500 mg by mouth every 6 (six) hours as needed for mild pain., Disp: , Rfl:    albuterol (VENTOLIN HFA) 108 (90 Base) MCG/ACT inhaler, Inhale 2 puffs into the lungs every 6 (six) hours as needed for wheezing or shortness of breath., Disp: 8 g, Rfl: 2   allopurinol (ZYLOPRIM) 300 MG tablet, Take 150 mg by mouth daily., Disp: , Rfl:    apixaban (ELIQUIS) 2.5 MG TABS tablet, Take 1 tablet (2.5 mg total) by mouth 2 (two) times daily., Disp: 60 tablet, Rfl: 1   aspirin 81 MG EC tablet, Take 81 mg by mouth daily. , Disp: , Rfl:    atorvastatin (LIPITOR) 40 MG tablet, TAKE 1 TABLET(40 MG) BY MOUTH DAILY, Disp: 90 tablet, Rfl: 1   benzonatate (TESSALON) 200 MG capsule, Take 1 capsule (200 mg total) by mouth 3 (three) times daily as needed for cough., Disp: 20 capsule, Rfl: 0   carvedilol (COREG) 6.25 MG tablet, Take 1 tablet (6.25 mg total) by mouth 2 (two) times daily with a meal., Disp: 60 tablet, Rfl: 0   dextromethorphan-guaiFENesin (MUCINEX DM) 30-600 MG 12hr tablet, Take 1 tablet by mouth 2 (two) times daily as needed for cough., Disp: 30 tablet, Rfl: 0   ipratropium-albuterol (DUONEB) 0.5-2.5 (3) MG/3ML SOLN, Inhale 3 mLs into the lungs every 4 (four) hours as needed (wheezing, shob)., Disp: 360 mL, Rfl: 1   isosorbide mononitrate (IMDUR) 30 MG 24 hr tablet, TAKE 1/2 TABLET(15 MG) BY MOUTH DAILY (Patient taking differently:  Take 15 mg by mouth daily.), Disp: 45 tablet, Rfl: 2   Magnesium 200 MG TABS, Take 1 tablet (200 mg total) by mouth in the morning and at bedtime., Disp: 60 tablet, Rfl: 5   pantoprazole (PROTONIX) 40 MG tablet, TAKE 1 TABLET BY MOUTH EVERY DAY, Disp: 90 tablet, Rfl: 4   potassium chloride SA (KLOR-CON M) 20 MEQ tablet, Take 20 mEq by mouth daily., Disp: , Rfl:    spironolactone (ALDACTONE) 25 MG tablet, Take 1 tablet (25 mg total) by mouth daily., Disp: 30 tablet, Rfl: 1   torsemide 60 MG TABS, Take 60 mg by mouth 2 (two) times daily., Disp: 60 tablet, Rfl: 1   triamcinolone ointment (KENALOG) 0.1 %, APPLY TOPICALLY TWICE DAILY AS DIRECTED, Disp: 454 g, Rfl: 3  Past Medical History: Past Medical History:  Diagnosis Date   AAA (abdominal aortic aneurysm) (HCC) 06/03/2007   [facility]; Dr. Hart [place]   AICD (automatic cardioverter/defibrillator) present    Arrhythmia    atrial fibrillation   Barrett's esophagus    Bladder cancer (HCC)    Bradycardia    CAD (coronary artery disease)    CAP (community acquired pneumonia) 11/13/2019   CHF (congestive heart failure) (HCC)    Cluster headache    COVID-19 09/27/2021   DDD (degenerative disc disease), lumbar    Diabetes mellitus without complication (HCC)    Dyspnea  WITH EXERTION   Edema    LEFT ANKLE   Fracture of skull base (HCC) 1997   due to fall   GERD (gastroesophageal reflux disease)    Gout    History of bladder cancer 12/1995   Hyperlipidemia    Hypertension    Hypocalcemia 04/19/2020   Possibly secondary to diuretics.   Low=5.9 04/19/2020   Malignant melanoma (HCC) 12/2012   right dorsal forearm excised   Myocardial infarction [facility])    LAST 2014   Osteoarthritis of knee    Other specified complication of vascular prosthetic devices, implants and grafts, initial encounter (HCC) 08/22/2021   Pacemaker 10/10/2006   Pancreatitis 11/22/2019   Pneumonia    2016   Pre-diabetes    Psoriasis    Rib  fracture 1997   due to fall   Sleep apnea    CPAP   Stroke (HCC)    Venous incompetence     Tobacco Use: Social History   Tobacco Use  Smoking Status Former   Current packs/day: 0.00   Average packs/day: 1 pack/day for 30.0 years (30.0 ttl pk-yrs)   Types: Cigarettes   Start date: 11/25/1969   Quit date: 11/26/1999   Years since quitting: 23.5  Smokeless Tobacco Never    Labs: Review Flowsheet  More data exists      Latest Ref Rng & Units 08/06/2022 12/24/2022 02/25/2023 04/23/2023 05/05/2023  Labs for ITP Cardiac and Pulmonary Rehab  Hemoglobin A1c 4.8 - 5.6 % 6.5  6.5  - - 7.1   Bicarbonate 20.0 - 28.0 mmol/L - - 28.2  32.9  -  O2 Saturation % - - 25  22.6  -    Details             Exercise Target Goals: Exercise Program Goal: Individual exercise prescription set using results from initial 6 min walk test and THRR while considering  patient's activity barriers and safety.   Exercise Prescription Goal: Initial exercise prescription builds to 30-45 minutes a day of aerobic activity, 2-3 days per week.  Home exercise guidelines will be given to patient during program as part of exercise prescription that the participant will acknowledge.   Education: Aerobic Exercise: - Group verbal and visual presentation on the components of exercise prescription. Introduces F.I.T.T principle from ACSM for exercise prescriptions.  Reviews F.I.T.T. principles of aerobic exercise including progression. Written material given at graduation. Flowsheet Row Cardiac Rehab from 04/16/2023 in [facility] Cardiac and Pulmonary Rehab  Education need identified 04/10/23       Education: Resistance Exercise: - Group verbal and visual presentation on the components of exercise prescription. Introduces F.I.T.T principle from ACSM for exercise prescriptions  Reviews F.I.T.T. principles of resistance exercise including progression. Written material given at graduation.    Education: Exercise & Equipment  Safety: - Individual verbal instruction and demonstration of equipment use and safety with use of the equipment. Flowsheet Row Cardiac Rehab from 04/16/2023 in [facility] Cardiac and Pulmonary Rehab  Date 04/03/23  Educator [facility]  Instruction Review Code 1- Verbalizes Understanding       Education: Exercise Physiology & General Exercise Guidelines: - Group verbal and written instruction with models to review the exercise physiology of the cardiovascular system and associated critical values. Provides general exercise guidelines with specific guidelines to those with heart or lung disease.    Education: Flexibility, Balance, Mind/Body Relaxation: - Group verbal and visual presentation with interactive activity on the components of exercise prescription. Introduces F.I.T.T principle from ACSM for exercise  prescriptions. Reviews F.I.T.T. principles of flexibility and balance exercise training including progression. Also discusses the mind body connection.  Reviews various relaxation techniques to help reduce and manage stress (i.e. Deep breathing, progressive muscle relaxation, and visualization). Balance handout provided to take home. Written material given at graduation.   Activity Barriers & Risk Stratification:  Activity Barriers & Cardiac Risk Stratification - 04/10/23 1413       Activity Barriers & Cardiac Risk Stratification   Activity Barriers Arthritis;Balance Concerns    Cardiac Risk Stratification High             6 Minute Walk:  6 Minute Walk     Row Name 04/10/23 1412         6 Minute Walk   Phase Initial     Distance 765 feet     Walk Time 6 minutes     # of Rest Breaks 0     MPH 1.45     METS 1.26     RPE 15     Perceived Dyspnea  3     VO2 Peak 4.4     Symptoms Yes (comment)     Comments leg fatigue     Resting HR 67 bpm     Resting BP 102/50     Resting Oxygen Saturation  97 %     Exercise Oxygen Saturation  during 6 min walk 94 %     Max Ex. HR 95 bpm      Max Ex. BP 118/58     2 Minute Post BP 104/52              Oxygen Initial Assessment:  Oxygen Initial Assessment - 04/03/23 1005       Home Oxygen   Home Oxygen Device --    Sleep Oxygen Prescription --    Liters per minute --    Home Exercise Oxygen Prescription --    Home Resting Oxygen Prescription --    Compliance with Home Oxygen Use --      Initial 6 min Walk   Oxygen Used --      Program Oxygen Prescription   Program Oxygen Prescription --      Intervention   Short Term Goals --    Long  Term Goals --             Oxygen Re-Evaluation:   Oxygen Discharge (Final Oxygen Re-Evaluation):   Initial Exercise Prescription:  Initial Exercise Prescription - 04/10/23 1400       Date of Initial Exercise RX and Referring Provider   Date 04/10/23    Referring Provider Dr. Marcina Millard, MD      Oxygen   Maintain Oxygen Saturation 88% or higher      Treadmill   MPH 1    Grade 0    Minutes 15    METs 1.8      NuStep   Level 2    SPM 80    Minutes 15    METs 1.26      Biostep-RELP   Level 1    SPM 80    Minutes 15    METs 1.23      Prescription Details   Frequency (times per week) 2    Duration Progress to 30 minutes of continuous aerobic without signs/symptoms of physical distress      Intensity   THRR 40-80% of Max Heartrate 96-125    Ratings of Perceived Exertion 11-13    Perceived Dyspnea 0-4  Progression   Progression Continue to progress workloads to maintain intensity without signs/symptoms of physical distress.      Resistance Training   Training Prescription Yes    Weight 3 lb    Reps 10-15             Perform Capillary Blood Glucose checks as needed.  Exercise Prescription Changes:   Exercise Prescription Changes     Row Name 04/10/23 1400 04/23/23 1600 05/07/23 0700         Response to Exercise   Blood Pressure (Admit) 102/50 102/60 102/64     Blood Pressure (Exercise) 118/58 128/66 118/74     Blood  Pressure (Exit) 104/52 110/64 100/58     Heart Rate (Admit) 67 bpm 77 bpm 77 bpm     Heart Rate (Exercise) 95 bpm 107 bpm 97 bpm     Heart Rate (Exit) 72 bpm 67 bpm 67 bpm     Oxygen Saturation (Admit) 97 % -- --     Oxygen Saturation (Exercise) 94 % -- --     Rating of Perceived Exertion (Exercise) 15 13 13      Perceived Dyspnea (Exercise) 3 -- --     Symptoms leg fatigue none none     Comments Results third full day of exercise --     Duration -- Progress to 30 minutes of  aerobic without signs/symptoms of physical distress Progress to 30 minutes of  aerobic without signs/symptoms of physical distress     Intensity -- THRR unchanged THRR unchanged       Progression   Progression -- Continue to progress workloads to maintain intensity without signs/symptoms of physical distress. Continue to progress workloads to maintain intensity without signs/symptoms of physical distress.     Average METs -- 2.18 1.89       Resistance Training   Training Prescription -- Yes Yes     Weight -- 3 lb 3 lb     Reps -- 10-15 10-15       Interval Training   Interval Training -- No No       Treadmill   MPH -- 1 1     Grade -- 0 0     Minutes -- 15 15     METs -- 1.77 1.77       NuStep   Level -- 2 3     Minutes -- 15 15     METs -- 2.6 2       Oxygen   Maintain Oxygen Saturation -- 88% or higher 88% or higher              Exercise Comments:   Exercise Comments     Row Name 04/14/23 1034 06/19/23 0747         Exercise Comments First full day of exercise!  Patient was oriented to gym and equipment including functions, settings, policies, and procedures.  Patient's individual exercise prescription and treatment plan were reviewed.  All starting workloads were established based on the results of the 6 minute walk test done at initial orientation visit.  The plan for exercise progression was also introduced and progression will be customized based on patient's performance and goals.  SAm has not attended the program since 5/29.   COntact attempts have been made.               Exercise Goals and Review:   Exercise Goals     Row Name 04/10/23 1415  Exercise Goals   Increase Physical Activity Yes       Intervention Provide advice, education, support and counseling about physical activity/exercise needs.;Develop an individualized exercise prescription for aerobic and resistive training based on initial evaluation findings, risk stratification, comorbidities and participant's personal goals.       Expected Outcomes Short Term: Attend rehab on a regular basis to increase amount of physical activity.;Long Term: Add in home exercise to make exercise part of routine and to increase amount of physical activity.;Long Term: Exercising regularly at least 3-5 days a week.       Increase Strength and Stamina Yes       Intervention Provide advice, education, support and counseling about physical activity/exercise needs.;Develop an individualized exercise prescription for aerobic and resistive training based on initial evaluation findings, risk stratification, comorbidities and participant's personal goals.       Expected Outcomes Short Term: Increase workloads from initial exercise prescription for resistance, speed, and METs.;Short Term: Perform resistance training exercises routinely during rehab and add in resistance training at home;Long Term: Improve cardiorespiratory fitness, muscular endurance and strength as measured by increased METs and functional capacity ( )       Able to understand and use rate of perceived exertion (RPE) scale Yes       Intervention Provide education and explanation on how to use RPE scale       Expected Outcomes Short Term: Able to use RPE daily in rehab to express subjective intensity level;Long Term:  Able to use RPE to guide intensity level when exercising independently       Able to understand and use Dyspnea scale Yes        Intervention Provide education and explanation on how to use Dyspnea scale       Expected Outcomes Short Term: Able to use Dyspnea scale daily in rehab to express subjective sense of shortness of breath during exertion;Long Term: Able to use Dyspnea scale to guide intensity level when exercising independently       Knowledge and understanding of Target Heart Rate Range (THRR) Yes       Intervention Provide education and explanation of THRR including how the numbers were predicted and where they are located for reference       Expected Outcomes Short Term: Able to state/look up THRR;Long Term: Able to use THRR to govern intensity when exercising independently;Short Term: Able to use daily as guideline for intensity in rehab       Able to check pulse independently Yes       Intervention Provide education and demonstration on how to check pulse in carotid and radial arteries.;Review the importance of being able to check your own pulse for safety during independent exercise       Expected Outcomes Short Term: Able to explain why pulse checking is important during independent exercise;Long Term: Able to check pulse independently and accurately       Understanding of Exercise Prescription Yes       Intervention Provide education, explanation, and written materials on patient's individual exercise prescription       Expected Outcomes Short Term: Able to explain program exercise prescription;Long Term: Able to explain home exercise prescription to exercise independently                Exercise Goals Re-Evaluation :  Exercise Goals Re-Evaluation     Row Name 04/14/23 1034 04/23/23 1609 05/07/23 0739 05/21/23 1329       Exercise Goal Re-Evaluation   Exercise  Goals Review Able to understand and use rate of perceived exertion (RPE) scale;Able to understand and use Dyspnea scale;Knowledge and understanding of Target Heart Rate Range (THRR);Understanding of Exercise Prescription Increase Physical  Activity;Increase Strength and Stamina;Understanding of Exercise Prescription Increase Physical Activity;Increase Strength and Stamina;Understanding of Exercise Prescription Increase Physical Activity;Increase Strength and Stamina;Understanding of Exercise Prescription    Comments Reviewed RPE scale, THR and program prescription with pt today.  Pt voiced understanding and was given a copy of goals to take home. Sam is off to a good start in rehab.  He has completed his first three full day sessions thus far.  He has been able to do the workloads and already 2.6 METs on the NuStep.  We will continue to encourage consistent attendance and monitor his progress. Sam has only attended rehab once since the last review due to being in the hospital. He was able to improve to level 3 on the T4 nustep and continue to walk the treadmill at a speed of 1 mph with no incline. We will continue to monitor his progress when he returns to the program. Sam has not attended rehab since last review. He had been hospitalized due to his heart failure. We will wait to hear back from him on when he plans to return to the program.    Expected Outcomes Short: Use RPE daily to regulate intensity. Long: Follow program prescription in THR. Short: Continue to attend rehab regularly Long: Continue to follow program prescription Short: Return to rehab when appropriate. Long: Continue to improve strength and stamina. Short: Return to rehab when appropriate. Long: Continue to improve strength and stamina.             Discharge Exercise Prescription (Final Exercise Prescription Changes):  Exercise Prescription Changes - 05/07/23 0700       Response to Exercise   Blood Pressure (Admit) 102/64    Blood Pressure (Exercise) 118/74    Blood Pressure (Exit) 100/58    Heart Rate (Admit) 77 bpm    Heart Rate (Exercise) 97 bpm    Heart Rate (Exit) 67 bpm    Rating of Perceived Exertion (Exercise) 13    Symptoms none    Duration Progress  to 30 minutes of  aerobic without signs/symptoms of physical distress    Intensity THRR unchanged      Progression   Progression Continue to progress workloads to maintain intensity without signs/symptoms of physical distress.    Average METs 1.89      Resistance Training   Training Prescription Yes    Weight 3 lb    Reps 10-15      Interval Training   Interval Training No      Treadmill   MPH 1    Grade 0    Minutes 15    METs 1.77      NuStep   Level 3    Minutes 15    METs 2      Oxygen   Maintain Oxygen Saturation 88% or higher             Nutrition:  Target Goals: Understanding of nutrition guidelines, daily intake of sodium 1500mg , cholesterol 200mg , calories 30% from fat and 7% or less from saturated fats, daily to have 5 or more servings of fruits and vegetables.  Education: All About Nutrition: -Group instruction provided by verbal, written material, interactive activities, discussions, models, and posters to present general guidelines for heart healthy nutrition including fat, fiber, MyPlate, the role of  sodium in heart healthy nutrition, utilization of the nutrition label, and utilization of this knowledge for meal planning. Follow up email sent as well. Written material given at graduation. Flowsheet Row Cardiac Rehab from 04/16/2023 in [facility] Cardiac and Pulmonary Rehab  Education need identified 04/10/23       Biometrics:  Pre Biometrics - 04/10/23 1416       Pre Biometrics   Height 5' 8.5" (1.74 m)    Weight 188 lb 14.4 oz (85.7 kg)    Waist Circumference 44 inches    Hip Circumference 40 inches    Waist to Hip Ratio 1.1 %    BMI (Calculated) 28.3    Single Leg Stand 3.3 seconds              Nutrition Therapy Plan and Nutrition Goals:  Nutrition Therapy & Goals - 04/10/23 1401       Intervention Plan   Intervention Prescribe, educate and counsel regarding individualized specific dietary modifications aiming towards targeted core  components such as weight, hypertension, lipid management, diabetes, heart failure and other comorbidities.    Expected Outcomes Short Term Goal: Understand basic principles of dietary content, such as calories, fat, sodium, cholesterol and nutrients.;Short Term Goal: A plan has been developed with personal nutrition goals set during dietitian appointment.;Long Term Goal: Adherence to prescribed nutrition plan.             Nutrition Assessments:  MEDIFICTS Score Key: ?70 Need to make dietary changes  40-70 Heart Healthy Diet ? 40 Therapeutic Level Cholesterol Diet  Flowsheet Row Cardiac Rehab from 04/10/2023 in [facility] Cardiac and Pulmonary Rehab  Picture Your Plate Total Score on Admission 60      Picture Your Plate Scores: <30 Unhealthy dietary pattern with much room for improvement. 41-50 Dietary pattern unlikely to meet recommendations for good health and room for improvement. 51-60 More healthful dietary pattern, with some room for improvement.  >60 Healthy dietary pattern, although there may be some specific behaviors that could be improved.    Nutrition Goals Re-Evaluation:   Nutrition Goals Discharge (Final Nutrition Goals Re-Evaluation):   Psychosocial: Target Goals: Acknowledge presence or absence of significant depression and/or stress, maximize coping skills, provide positive support system. Participant is able to verbalize types and ability to use techniques and skills needed for reducing stress and depression.   Education: Stress, Anxiety, and Depression - Group verbal and visual presentation to define topics covered.  Reviews how body is impacted by stress, anxiety, and depression.  Also discusses healthy ways to reduce stress and to treat/manage anxiety and depression.  Written material given at graduation. Flowsheet Row Cardiac Rehab from 04/16/2023 in [facility] Cardiac and Pulmonary Rehab  Date 04/16/23  Educator SB  Instruction Review Code 1- [employer]  Understanding       Education: Sleep Hygiene -Provides group verbal and written instruction about how sleep can affect your health.  Define sleep hygiene, discuss sleep cycles and impact of sleep habits. Review good sleep hygiene tips.    Initial Review & Psychosocial Screening:  Initial Psych Review & Screening - 04/03/23 1013       Initial Review   Current issues with None Identified;Current Stress Concerns    Source of Stress Concerns Chronic Illness      Family Dynamics   Comments He can look to his wife for support. He states no depression or anxiety and does not take anything for his mood.      Barriers   Psychosocial barriers to participate  in program The patient should benefit from training in stress management and relaxation.      Screening Interventions   Interventions To provide support and resources with identified psychosocial needs;Provide feedback about the scores to participant;Encouraged to exercise    Expected Outcomes Short Term goal: Utilizing psychosocial counselor, staff and physician to assist with identification of specific Stressors or current issues interfering with healing process. Setting desired goal for each stressor or current issue identified.;Long Term Goal: Stressors or current issues are controlled or eliminated.;Short Term goal: Identification and review with participant of any Quality of Life or Depression concerns found by scoring the questionnaire.;Long Term goal: The participant improves quality of Life and PHQ9 Scores as seen by post scores and/or verbalization of changes             Quality of Life Scores:   Quality of Life - 04/10/23 1411       Quality of Life   Select Quality of Life      Quality of Life Scores   Health/Function Pre 24.97 %    Socioeconomic Pre 18.93 %    Psych/Spiritual Pre 22.93 %    Family Pre 23.8 %    GLOBAL Pre 23.13 %            Scores of 19 and below usually indicate a poorer quality of life in  these areas.  A difference of  2-3 points is a clinically meaningful difference.  A difference of 2-3 points in the total score of the Quality of Life Index has been associated with significant improvement in overall quality of life, self-image, physical symptoms, and general health in studies assessing change in quality of life.  PHQ-9: Review Flowsheet  More data exists      05/12/2023 05/05/2023 04/10/2023 01/03/2023 12/24/2022  Depression screen PHQ 2/9  Decreased Interest 1 2 2 2 1   Down, Depressed, Hopeless 1 2 2 2 2   PHQ - 2 Score 2 4 4 4 3   Altered sleeping 0 0 0 1 0  Tired, decreased energy 1 2 2 3 3   Change in appetite 1 1 0 1 1  Feeling bad or failure about yourself  1 1 2 3 2   Trouble concentrating 0 1 1 1 2   Moving slowly or fidgety/restless 0 2 2 2 3   Suicidal thoughts 0 1 0 1 1  PHQ-9 Score 5 12 11 16 15   Difficult doing work/chores Not difficult at all Somewhat difficult Somewhat difficult Somewhat difficult Somewhat difficult    Details           Interpretation of Total Score  Total Score Depression Severity:  1-4 = Minimal depression, 5-9 = Mild depression, 10-14 = Moderate depression, 15-19 = Moderately severe depression, 20-27 = Severe depression   Psychosocial Evaluation and Intervention:  Psychosocial Evaluation - 04/03/23 1013       Psychosocial Evaluation & Interventions   Interventions Relaxation education;Encouraged to exercise with the program and follow exercise prescription;Stress management education    Comments He can look to his wife for support. He states no depression or anxiety and does not take anything for his mood.    Expected Outcomes Short: Start [facility] to help with mood. Long: Maintain a healthy mental state    Continue Psychosocial Services  Follow up required by staff             Psychosocial Re-Evaluation:   Psychosocial Discharge (Final Psychosocial Re-Evaluation):   Vocational Rehabilitation: Provide vocational rehab  assistance to qualifying  candidates.   Vocational Rehab Evaluation & Intervention:   Education: Education Goals: Education classes will be provided on a variety of topics geared toward better understanding of heart health and risk factor modification. Participant will state understanding/return demonstration of topics presented as noted by education test scores.  Learning Barriers/Preferences:  Learning Barriers/Preferences - 04/03/23 1051       Learning Barriers/Preferences   Learning Barriers Hearing    Learning Preferences None             General Cardiac Education Topics:  AED/CPR: - Group verbal and written instruction with the use of models to demonstrate the basic use of the AED with the basic ABC's of resuscitation.   Anatomy and Cardiac Procedures: - Group verbal and visual presentation and models provide information about basic cardiac anatomy and function. Reviews the testing methods done to diagnose heart disease and the outcomes of the test results. Describes the treatment choices: Medical Management, Angioplasty, or Coronary Bypass Surgery for treating various heart conditions including Myocardial Infarction, Angina, Valve Disease, and Cardiac Arrhythmias.  Written material given at graduation. Flowsheet Row Cardiac Rehab from 04/16/2023 in [facility] Cardiac and Pulmonary Rehab  Education need identified 04/10/23       Medication Safety: - Group verbal and visual instruction to review commonly prescribed medications for heart and lung disease. Reviews the medication, class of the drug, and side effects. Includes the steps to properly store meds and maintain the prescription regimen.  Written material given at graduation.   Intimacy: - Group verbal instruction through game format to discuss how heart and lung disease can affect sexual intimacy. Written material given at graduation..   Know Your Numbers and Heart Failure: - Group verbal and visual instruction to  discuss disease risk factors for cardiac and pulmonary disease and treatment options.  Reviews associated critical values for Overweight/Obesity, Hypertension, Cholesterol, and Diabetes.  Discusses basics of heart failure: signs/symptoms and treatments.  Introduces Heart Failure Zone chart for action plan for heart failure.  Written material given at graduation. Flowsheet Row Cardiac Rehab from 04/16/2023 in [facility] Cardiac and Pulmonary Rehab  Education need identified 04/10/23       Infection Prevention: - Provides verbal and written material to individual with discussion of infection control including proper hand washing and proper equipment cleaning during exercise session. Flowsheet Row Cardiac Rehab from 04/16/2023 in [facility] Cardiac and Pulmonary Rehab  Date 04/03/23  Educator [facility]  Instruction Review Code 1- Verbalizes Understanding       Falls Prevention: - Provides verbal and written material to individual with discussion of falls prevention and safety. Flowsheet Row Cardiac Rehab from 04/16/2023 in [facility] Cardiac and Pulmonary Rehab  Date 04/03/23  Educator [facility]  Instruction Review Code 1- Verbalizes Understanding       Other: -Provides group and verbal instruction on various topics (see comments)   Knowledge Questionnaire Score:  Knowledge Questionnaire Score - 04/10/23 1401       Knowledge Questionnaire Score   Pre Score 18/26             Core Components/Risk Factors/Patient Goals at Admission:  Personal Goals and Risk Factors at Admission - 04/10/23 1412       Core Components/Risk Factors/Patient Goals on Admission    Weight Management Yes;Weight Loss    Intervention Weight Management: Provide education and appropriate resources to help participant work on and attain dietary goals.;Weight Management: Develop a combined nutrition and exercise program designed to reach desired caloric intake, while maintaining appropriate intake  of nutrient and fiber, sodium and fats,  and appropriate energy expenditure required for the weight goal.;Weight Management/Obesity: Establish reasonable short term and long term weight goals.;Obesity: Provide education and appropriate resources to help participant work on and attain dietary goals.    Admit Weight 188 lb 14.4 oz (85.7 kg)    Goal Weight: Short Term 183 lb (83 kg)    Goal Weight: Long Term 178 lb (80.7 kg)    Expected Outcomes Short Term: Continue to assess and modify interventions until short term weight is achieved;Long Term: Adherence to nutrition and physical activity/exercise program aimed toward attainment of established weight goal;Weight Loss: Understanding of general recommendations for a balanced deficit meal plan, which promotes 1-2 lb weight loss per week and includes a negative energy balance of [phone] kcal/d;Understanding recommendations for meals to include 15-35% energy as protein, 25-35% energy from fat, 35-60% energy from carbohydrates, less than 200mg  of dietary cholesterol, 20-35 gm of total fiber daily;Understanding of distribution of calorie intake throughout the day with the consumption of 4-5 meals/snacks    Diabetes Yes    Intervention Provide education about signs/symptoms and action to take for hypo/hyperglycemia.;Provide education about proper nutrition, including hydration, and aerobic/resistive exercise prescription along with prescribed medications to achieve blood glucose in normal ranges: Fasting glucose 65-99 mg/dL    Expected Outcomes Long Term: Attainment of HbA1C < 7%.;Short Term: Participant verbalizes understanding of the signs/symptoms and immediate care of hyper/hypoglycemia, proper foot care and importance of medication, aerobic/resistive exercise and nutrition plan for blood glucose control.    Heart Failure Yes    Intervention Provide a combined exercise and nutrition program that is supplemented with education, support and counseling about heart failure. Directed toward relieving  symptoms such as shortness of breath, decreased exercise tolerance, and extremity edema.    Expected Outcomes Improve functional capacity of life;Short term: Attendance in program 2-3 days a week with increased exercise capacity. Reported lower sodium intake. Reported increased fruit and vegetable intake. Reports medication compliance.;Short term: Daily weights obtained and reported for increase. Utilizing diuretic protocols set by physician.;Long term: Adoption of self-care skills and reduction of barriers for early signs and symptoms recognition and intervention leading to self-care maintenance.    Hypertension Yes    Intervention Provide education on lifestyle modifcations including regular physical activity/exercise, weight management, moderate sodium restriction and increased consumption of fresh fruit, vegetables, and low fat dairy, alcohol moderation, and smoking cessation.;Monitor prescription use compliance.    Expected Outcomes Short Term: Continued assessment and intervention until BP is < 140/66mm HG in hypertensive participants. < 130/5mm HG in hypertensive participants with diabetes, heart failure or chronic kidney disease.;Long Term: Maintenance of blood pressure at goal levels.    Lipids Yes    Intervention Provide education and support for participant on nutrition & aerobic/resistive exercise along with prescribed medications to achieve LDL 70mg , HDL >40mg .    Expected Outcomes Short Term: Participant states understanding of desired cholesterol values and is compliant with medications prescribed. Participant is following exercise prescription and nutrition guidelines.;Long Term: Cholesterol controlled with medications as prescribed, with individualized exercise RX and with personalized nutrition plan. Value goals: LDL < 70mg , HDL > 40 mg.             Education:Diabetes - Individual verbal and written instruction to review signs/symptoms of diabetes, desired ranges of glucose level  fasting, after meals and with exercise. Acknowledge that pre and post exercise glucose checks will be done for 3 sessions at entry of program. [employer]  Cardiac Rehab from 04/16/2023 in [facility] Cardiac and Pulmonary Rehab  Date 04/03/23  Educator [facility]  Instruction Review Code 1- Verbalizes Understanding       Core Components/Risk Factors/Patient Goals Review:    Core Components/Risk Factors/Patient Goals at Discharge (Final Review):    ITP Comments:  ITP Comments     Row Name 04/03/23 1038 04/10/23 1357 04/14/23 1033 05/01/23 0937 05/07/23 1120   ITP Comments Visit completed. Patient informed on EP and RD appointment and 6 Minute walk test. Patient also informed of patient health questionnaires on My Chart. Patient Verbalizes understanding. Visit diagnosis can be found in [facility] 02/25/2023. Patient came in with mild shortness of breath. He states it was the walk down from his car. Checked patients oxygen 94% and heart rate 63. He managed to get his breath back a little with pursed lipped breathing. Ask the patient a few more questions and it was clear his shortness of breath was not getting better. He was willing to go the [place] walk in clinic. When transitioning him to a different wheel chair he was more short of breath and oxygen saturations were going into the 80s. Informed staff that he was feeling sick the past few days and states his weight has increased. Staff at [facility] took him to the ER. Completed and gym orientation. Initial ITP created and sent for review to Dr. Bethann Punches, Medical Director. First full day of exercise!  Patient was oriented to gym and equipment including functions, settings, policies, and procedures.  Patient's individual exercise prescription and treatment plan were reviewed.  All starting workloads were established based on the results of the 6 minute walk test done at initial orientation visit.  The plan for exercise progression was also introduced and  progression will be customized based on patient's performance and goals. Pt was readmitted on 5/29 after fall and had more fluid build up and pnuemonia.  He was discharged on 6/4.  He has follow up appt scheduled for 6/10.  He will need clearance from doctor prior to returning to rehab.  We have been unable to assess for goals at this time. 30 Day review completed. Medical Director ITP review done, changes made as directed, and signed approval by Medical Director.   out for medical reasons    Row Name 06/03/23 1454 06/09/23 1624 06/12/23 1610       ITP Comments 30 Day review completed. Medical Director ITP review done, changes made as directed, and signed approval by Medical Director.   last visit in [place] Attempted to call pt to follow up with him. His last cardiac rehab session attended was 5/29. He was admitted same day for acute on chronic CHF and ascites. No answer when called pt. Unable to leave voicemail. Will try to reach pt at a later time. Unable to reach pt. Pt has not attended cardiac rehab since 5/29. Letter mailed to pt. Discharge date set for 8/1.              Comments: Discharge ITP

## 2023-06-25 NOTE — Progress Notes (Unsigned)
Discharge Summary: Taylor [name] (DOB 06/16/1941)  Sam was discharged from cardiac rehab. He was unable to attend class at this time. He completed 4 out of 36 sessions.    6 Minute Walk     Row Name 04/10/23 1412         6 Minute Walk   Phase Initial     Distance 765 feet     Walk Time 6 minutes     # of Rest Breaks 0     MPH 1.45     METS 1.26     RPE 15     Perceived Dyspnea  3     VO2 Peak 4.4     Symptoms Yes (comment)     Comments leg fatigue     Resting HR 67 bpm     Resting BP 102/50     Resting Oxygen Saturation  97 %     Exercise Oxygen Saturation  during 6 min walk 94 %     Max Ex. HR 95 bpm     Max Ex. BP 118/58     2 Minute Post BP 104/52

## 2023-06-30 ENCOUNTER — Telehealth: Payer: Self-pay | Admitting: *Deleted

## 2023-06-30 NOTE — Patient Outreach (Signed)
  Care Coordination   Follow Up Visit Note   06/30/2023 Name: Taylor [name] MRN: [id number] DOB: [birth year]  Taylor [name] is a 82 y.o. year old male who sees [name], Demetrios Isaacs, MD for primary care. I spoke with [name], wife of  Taylor [name] by phone today.  What matters to the patients health and wellness today?  Per wife, patient currently not available and will call this RNCM back.     SDOH assessments and interventions completed:  No     Care Coordination Interventions:  No, not indicated   Follow up plan: Follow up call scheduled for 8/16    Encounter Outcome:  Pt. Request to Call Back   Kemper Durie, RN, MSN, [facility] Care Management Care Management Coordinator [phone]

## 2023-07-01 DIAGNOSIS — E7800 Pure hypercholesterolemia, unspecified: Secondary | ICD-10-CM | POA: Diagnosis not present

## 2023-07-01 DIAGNOSIS — I509 Heart failure, unspecified: Secondary | ICD-10-CM | POA: Diagnosis not present

## 2023-07-01 DIAGNOSIS — E1122 Type 2 diabetes mellitus with diabetic chronic kidney disease: Secondary | ICD-10-CM | POA: Diagnosis not present

## 2023-07-01 DIAGNOSIS — I25119 Atherosclerotic heart disease of native coronary artery with unspecified angina pectoris: Secondary | ICD-10-CM | POA: Diagnosis not present

## 2023-07-01 DIAGNOSIS — T82898A Other specified complication of vascular prosthetic devices, implants and grafts, initial encounter: Secondary | ICD-10-CM | POA: Diagnosis not present

## 2023-07-02 ENCOUNTER — Encounter: Payer: Self-pay | Admitting: *Deleted

## 2023-07-02 DIAGNOSIS — I5022 Chronic systolic (congestive) heart failure: Secondary | ICD-10-CM

## 2023-07-02 DIAGNOSIS — J4489 Other specified chronic obstructive pulmonary disease: Secondary | ICD-10-CM | POA: Diagnosis not present

## 2023-07-02 NOTE — Progress Notes (Signed)
Cardiac Individual Treatment Plan  Patient Details  Name: Taylor [name] MRN: [id number] Date of Birth: 1941-01-23 Referring Provider:   Flowsheet Row Cardiac Rehab from 04/10/2023 in [facility] Cardiac and Pulmonary Rehab  Referring Provider Dr. Marcina Millard, MD       Initial Encounter Date:  Flowsheet Row Cardiac Rehab from 04/10/2023 in [facility] Cardiac and Pulmonary Rehab  Date 04/10/23       Visit Diagnosis: Heart failure, chronic systolic (HCC)  Patient's Home Medications on Admission:  Current Outpatient Medications:    acetaminophen (TYLENOL) 500 MG tablet, Take 500 mg by mouth every 6 (six) hours as needed for mild pain., Disp: , Rfl:    albuterol (VENTOLIN HFA) 108 (90 Base) MCG/ACT inhaler, Inhale 2 puffs into the lungs every 6 (six) hours as needed for wheezing or shortness of breath., Disp: 8 g, Rfl: 2   allopurinol (ZYLOPRIM) 300 MG tablet, Take 150 mg by mouth daily., Disp: , Rfl:    apixaban (ELIQUIS) 2.5 MG TABS tablet, Take 1 tablet (2.5 mg total) by mouth 2 (two) times daily., Disp: 60 tablet, Rfl: 1   aspirin 81 MG EC tablet, Take 81 mg by mouth daily. , Disp: , Rfl:    atorvastatin (LIPITOR) 40 MG tablet, TAKE 1 TABLET(40 MG) BY MOUTH DAILY, Disp: 90 tablet, Rfl: 1   benzonatate (TESSALON) 200 MG capsule, Take 1 capsule (200 mg total) by mouth 3 (three) times daily as needed for cough., Disp: 20 capsule, Rfl: 0   carvedilol (COREG) 6.25 MG tablet, Take 1 tablet (6.25 mg total) by mouth 2 (two) times daily with a meal., Disp: 60 tablet, Rfl: 0   dextromethorphan-guaiFENesin (MUCINEX DM) 30-600 MG 12hr tablet, Take 1 tablet by mouth 2 (two) times daily as needed for cough., Disp: 30 tablet, Rfl: 0   ipratropium-albuterol (DUONEB) 0.5-2.5 (3) MG/3ML SOLN, Inhale 3 mLs into the lungs every 4 (four) hours as needed (wheezing, shob)., Disp: 360 mL, Rfl: 1   isosorbide mononitrate (IMDUR) 30 MG 24 hr tablet, TAKE 1/2 TABLET(15 MG) BY MOUTH DAILY (Patient taking differently:  Take 15 mg by mouth daily.), Disp: 45 tablet, Rfl: 2   Magnesium 200 MG TABS, Take 1 tablet (200 mg total) by mouth in the morning and at bedtime., Disp: 60 tablet, Rfl: 5   pantoprazole (PROTONIX) 40 MG tablet, TAKE 1 TABLET BY MOUTH EVERY DAY, Disp: 90 tablet, Rfl: 4   potassium chloride SA (KLOR-CON M) 20 MEQ tablet, Take 20 mEq by mouth daily., Disp: , Rfl:    spironolactone (ALDACTONE) 25 MG tablet, Take 1 tablet (25 mg total) by mouth daily., Disp: 30 tablet, Rfl: 1   torsemide 60 MG TABS, Take 60 mg by mouth 2 (two) times daily., Disp: 60 tablet, Rfl: 1   triamcinolone ointment (KENALOG) 0.1 %, APPLY TOPICALLY TWICE DAILY AS DIRECTED, Disp: 454 g, Rfl: 3  Past Medical History: Past Medical History:  Diagnosis Date   AAA (abdominal aortic aneurysm) (HCC) 06/03/2007   [facility]; Dr. Hart [place]   AICD (automatic cardioverter/defibrillator) present    Arrhythmia    atrial fibrillation   Barrett's esophagus    Bladder cancer (HCC)    Bradycardia    CAD (coronary artery disease)    CAP (community acquired pneumonia) 11/13/2019   CHF (congestive heart failure) (HCC)    Cluster headache    COVID-19 09/27/2021   DDD (degenerative disc disease), lumbar    Diabetes mellitus without complication (HCC)    Dyspnea  WITH EXERTION   Edema    LEFT ANKLE   Fracture of skull base (HCC) 1997   due to fall   GERD (gastroesophageal reflux disease)    Gout    History of bladder cancer 12/1995   Hyperlipidemia    Hypertension    Hypocalcemia 04/19/2020   Possibly secondary to diuretics.   Low=5.9 04/19/2020   Malignant melanoma (HCC) 12/2012   right dorsal forearm excised   Myocardial infarction [facility])    LAST 2014   Osteoarthritis of knee    Other specified complication of vascular prosthetic devices, implants and grafts, initial encounter (HCC) 08/22/2021   Pacemaker 10/10/2006   Pancreatitis 11/22/2019   Pneumonia    2016   Pre-diabetes    Psoriasis    Rib  fracture 1997   due to fall   Sleep apnea    CPAP   Stroke (HCC)    Venous incompetence     Tobacco Use: Social History   Tobacco Use  Smoking Status Former   Current packs/day: 0.00   Average packs/day: 1 pack/day for 30.0 years (30.0 ttl pk-yrs)   Types: Cigarettes   Start date: 11/25/1969   Quit date: 11/26/1999   Years since quitting: 23.6  Smokeless Tobacco Never    Labs: Review Flowsheet  More data exists      Latest Ref Rng & Units 08/06/2022 12/24/2022 02/25/2023 04/23/2023 05/05/2023  Labs for ITP Cardiac and Pulmonary Rehab  Hemoglobin A1c 4.8 - 5.6 % 6.5  6.5  - - 7.1   Bicarbonate 20.0 - 28.0 mmol/L - - 28.2  32.9  -  O2 Saturation % - - 25  22.6  -    Details             Exercise Target Goals: Exercise Program Goal: Individual exercise prescription set using results from initial 6 min walk test and THRR while considering  patient's activity barriers and safety.   Exercise Prescription Goal: Initial exercise prescription builds to 30-45 minutes a day of aerobic activity, 2-3 days per week.  Home exercise guidelines will be given to patient during program as part of exercise prescription that the participant will acknowledge.   Education: Aerobic Exercise: - Group verbal and visual presentation on the components of exercise prescription. Introduces F.I.T.T principle from ACSM for exercise prescriptions.  Reviews F.I.T.T. principles of aerobic exercise including progression. Written material given at graduation. Flowsheet Row Cardiac Rehab from 04/16/2023 in [facility] Cardiac and Pulmonary Rehab  Education need identified 04/10/23       Education: Resistance Exercise: - Group verbal and visual presentation on the components of exercise prescription. Introduces F.I.T.T principle from ACSM for exercise prescriptions  Reviews F.I.T.T. principles of resistance exercise including progression. Written material given at graduation.    Education: Exercise & Equipment  Safety: - Individual verbal instruction and demonstration of equipment use and safety with use of the equipment. Flowsheet Row Cardiac Rehab from 04/16/2023 in [facility] Cardiac and Pulmonary Rehab  Date 04/03/23  Educator [facility]  Instruction Review Code 1- Verbalizes Understanding       Education: Exercise Physiology & General Exercise Guidelines: - Group verbal and written instruction with models to review the exercise physiology of the cardiovascular system and associated critical values. Provides general exercise guidelines with specific guidelines to those with heart or lung disease.    Education: Flexibility, Balance, Mind/Body Relaxation: - Group verbal and visual presentation with interactive activity on the components of exercise prescription. Introduces F.I.T.T principle from ACSM for exercise  prescriptions. Reviews F.I.T.T. principles of flexibility and balance exercise training including progression. Also discusses the mind body connection.  Reviews various relaxation techniques to help reduce and manage stress (i.e. Deep breathing, progressive muscle relaxation, and visualization). Balance handout provided to take home. Written material given at graduation.   Activity Barriers & Risk Stratification:  Activity Barriers & Cardiac Risk Stratification - 04/10/23 1413       Activity Barriers & Cardiac Risk Stratification   Activity Barriers Arthritis;Balance Concerns    Cardiac Risk Stratification High             6 Minute Walk:  6 Minute Walk     Row Name 04/10/23 1412         6 Minute Walk   Phase Initial     Distance 765 feet     Walk Time 6 minutes     # of Rest Breaks 0     MPH 1.45     METS 1.26     RPE 15     Perceived Dyspnea  3     VO2 Peak 4.4     Symptoms Yes (comment)     Comments leg fatigue     Resting HR 67 bpm     Resting BP 102/50     Resting Oxygen Saturation  97 %     Exercise Oxygen Saturation  during 6 min walk 94 %     Max Ex. HR 95 bpm      Max Ex. BP 118/58     2 Minute Post BP 104/52              Oxygen Initial Assessment:  Oxygen Initial Assessment - 04/03/23 1005       Home Oxygen   Home Oxygen Device --    Sleep Oxygen Prescription --    Liters per minute --    Home Exercise Oxygen Prescription --    Home Resting Oxygen Prescription --    Compliance with Home Oxygen Use --      Initial 6 min Walk   Oxygen Used --      Program Oxygen Prescription   Program Oxygen Prescription --      Intervention   Short Term Goals --    Long  Term Goals --             Oxygen Re-Evaluation:   Oxygen Discharge (Final Oxygen Re-Evaluation):   Initial Exercise Prescription:  Initial Exercise Prescription - 04/10/23 1400       Date of Initial Exercise RX and Referring Provider   Date 04/10/23    Referring Provider Dr. Marcina Millard, MD      Oxygen   Maintain Oxygen Saturation 88% or higher      Treadmill   MPH 1    Grade 0    Minutes 15    METs 1.8      NuStep   Level 2    SPM 80    Minutes 15    METs 1.26      Biostep-RELP   Level 1    SPM 80    Minutes 15    METs 1.23      Prescription Details   Frequency (times per week) 2    Duration Progress to 30 minutes of continuous aerobic without signs/symptoms of physical distress      Intensity   THRR 40-80% of Max Heartrate 96-125    Ratings of Perceived Exertion 11-13    Perceived Dyspnea 0-4  Progression   Progression Continue to progress workloads to maintain intensity without signs/symptoms of physical distress.      Resistance Training   Training Prescription Yes    Weight 3 lb    Reps 10-15             Perform Capillary Blood Glucose checks as needed.  Exercise Prescription Changes:   Exercise Prescription Changes     Row Name 04/10/23 1400 04/23/23 1600 05/07/23 0700         Response to Exercise   Blood Pressure (Admit) 102/50 102/60 102/64     Blood Pressure (Exercise) 118/58 128/66 118/74     Blood  Pressure (Exit) 104/52 110/64 100/58     Heart Rate (Admit) 67 bpm 77 bpm 77 bpm     Heart Rate (Exercise) 95 bpm 107 bpm 97 bpm     Heart Rate (Exit) 72 bpm 67 bpm 67 bpm     Oxygen Saturation (Admit) 97 % -- --     Oxygen Saturation (Exercise) 94 % -- --     Rating of Perceived Exertion (Exercise) 15 13 13      Perceived Dyspnea (Exercise) 3 -- --     Symptoms leg fatigue none none     Comments Results third full day of exercise --     Duration -- Progress to 30 minutes of  aerobic without signs/symptoms of physical distress Progress to 30 minutes of  aerobic without signs/symptoms of physical distress     Intensity -- THRR unchanged THRR unchanged       Progression   Progression -- Continue to progress workloads to maintain intensity without signs/symptoms of physical distress. Continue to progress workloads to maintain intensity without signs/symptoms of physical distress.     Average METs -- 2.18 1.89       Resistance Training   Training Prescription -- Yes Yes     Weight -- 3 lb 3 lb     Reps -- 10-15 10-15       Interval Training   Interval Training -- No No       Treadmill   MPH -- 1 1     Grade -- 0 0     Minutes -- 15 15     METs -- 1.77 1.77       NuStep   Level -- 2 3     Minutes -- 15 15     METs -- 2.6 2       Oxygen   Maintain Oxygen Saturation -- 88% or higher 88% or higher              Exercise Comments:   Exercise Comments     Row Name 04/14/23 1034 06/19/23 0747         Exercise Comments First full day of exercise!  Patient was oriented to gym and equipment including functions, settings, policies, and procedures.  Patient's individual exercise prescription and treatment plan were reviewed.  All starting workloads were established based on the results of the 6 minute walk test done at initial orientation visit.  The plan for exercise progression was also introduced and progression will be customized based on patient's performance and goals.  SAm has not attended the program since 5/29.   COntact attempts have been made.               Exercise Goals and Review:   Exercise Goals     Row Name 04/10/23 1415  Exercise Goals   Increase Physical Activity Yes       Intervention Provide advice, education, support and counseling about physical activity/exercise needs.;Develop an individualized exercise prescription for aerobic and resistive training based on initial evaluation findings, risk stratification, comorbidities and participant's personal goals.       Expected Outcomes Short Term: Attend rehab on a regular basis to increase amount of physical activity.;Long Term: Add in home exercise to make exercise part of routine and to increase amount of physical activity.;Long Term: Exercising regularly at least 3-5 days a week.       Increase Strength and Stamina Yes       Intervention Provide advice, education, support and counseling about physical activity/exercise needs.;Develop an individualized exercise prescription for aerobic and resistive training based on initial evaluation findings, risk stratification, comorbidities and participant's personal goals.       Expected Outcomes Short Term: Increase workloads from initial exercise prescription for resistance, speed, and METs.;Short Term: Perform resistance training exercises routinely during rehab and add in resistance training at home;Long Term: Improve cardiorespiratory fitness, muscular endurance and strength as measured by increased METs and functional capacity ( )       Able to understand and use rate of perceived exertion (RPE) scale Yes       Intervention Provide education and explanation on how to use RPE scale       Expected Outcomes Short Term: Able to use RPE daily in rehab to express subjective intensity level;Long Term:  Able to use RPE to guide intensity level when exercising independently       Able to understand and use Dyspnea scale Yes        Intervention Provide education and explanation on how to use Dyspnea scale       Expected Outcomes Short Term: Able to use Dyspnea scale daily in rehab to express subjective sense of shortness of breath during exertion;Long Term: Able to use Dyspnea scale to guide intensity level when exercising independently       Knowledge and understanding of Target Heart Rate Range (THRR) Yes       Intervention Provide education and explanation of THRR including how the numbers were predicted and where they are located for reference       Expected Outcomes Short Term: Able to state/look up THRR;Long Term: Able to use THRR to govern intensity when exercising independently;Short Term: Able to use daily as guideline for intensity in rehab       Able to check pulse independently Yes       Intervention Provide education and demonstration on how to check pulse in carotid and radial arteries.;Review the importance of being able to check your own pulse for safety during independent exercise       Expected Outcomes Short Term: Able to explain why pulse checking is important during independent exercise;Long Term: Able to check pulse independently and accurately       Understanding of Exercise Prescription Yes       Intervention Provide education, explanation, and written materials on patient's individual exercise prescription       Expected Outcomes Short Term: Able to explain program exercise prescription;Long Term: Able to explain home exercise prescription to exercise independently                Exercise Goals Re-Evaluation :  Exercise Goals Re-Evaluation     Row Name 04/14/23 1034 04/23/23 1609 05/07/23 0739 05/21/23 1329       Exercise Goal Re-Evaluation   Exercise  Goals Review Able to understand and use rate of perceived exertion (RPE) scale;Able to understand and use Dyspnea scale;Knowledge and understanding of Target Heart Rate Range (THRR);Understanding of Exercise Prescription Increase Physical  Activity;Increase Strength and Stamina;Understanding of Exercise Prescription Increase Physical Activity;Increase Strength and Stamina;Understanding of Exercise Prescription Increase Physical Activity;Increase Strength and Stamina;Understanding of Exercise Prescription    Comments Reviewed RPE scale, THR and program prescription with pt today.  Pt voiced understanding and was given a copy of goals to take home. Sam is off to a good start in rehab.  He has completed his first three full day sessions thus far.  He has been able to do the workloads and already 2.6 METs on the NuStep.  We will continue to encourage consistent attendance and monitor his progress. Sam has only attended rehab once since the last review due to being in the hospital. He was able to improve to level 3 on the T4 nustep and continue to walk the treadmill at a speed of 1 mph with no incline. We will continue to monitor his progress when he returns to the program. Sam has not attended rehab since last review. He had been hospitalized due to his heart failure. We will wait to hear back from him on when he plans to return to the program.    Expected Outcomes Short: Use RPE daily to regulate intensity. Long: Follow program prescription in THR. Short: Continue to attend rehab regularly Long: Continue to follow program prescription Short: Return to rehab when appropriate. Long: Continue to improve strength and stamina. Short: Return to rehab when appropriate. Long: Continue to improve strength and stamina.             Discharge Exercise Prescription (Final Exercise Prescription Changes):  Exercise Prescription Changes - 05/07/23 0700       Response to Exercise   Blood Pressure (Admit) 102/64    Blood Pressure (Exercise) 118/74    Blood Pressure (Exit) 100/58    Heart Rate (Admit) 77 bpm    Heart Rate (Exercise) 97 bpm    Heart Rate (Exit) 67 bpm    Rating of Perceived Exertion (Exercise) 13    Symptoms none    Duration Progress  to 30 minutes of  aerobic without signs/symptoms of physical distress    Intensity THRR unchanged      Progression   Progression Continue to progress workloads to maintain intensity without signs/symptoms of physical distress.    Average METs 1.89      Resistance Training   Training Prescription Yes    Weight 3 lb    Reps 10-15      Interval Training   Interval Training No      Treadmill   MPH 1    Grade 0    Minutes 15    METs 1.77      NuStep   Level 3    Minutes 15    METs 2      Oxygen   Maintain Oxygen Saturation 88% or higher             Nutrition:  Target Goals: Understanding of nutrition guidelines, daily intake of sodium 1500mg , cholesterol 200mg , calories 30% from fat and 7% or less from saturated fats, daily to have 5 or more servings of fruits and vegetables.  Education: All About Nutrition: -Group instruction provided by verbal, written material, interactive activities, discussions, models, and posters to present general guidelines for heart healthy nutrition including fat, fiber, MyPlate, the role of  sodium in heart healthy nutrition, utilization of the nutrition label, and utilization of this knowledge for meal planning. Follow up email sent as well. Written material given at graduation. Flowsheet Row Cardiac Rehab from 04/16/2023 in [facility] Cardiac and Pulmonary Rehab  Education need identified 04/10/23       Biometrics:  Pre Biometrics - 04/10/23 1416       Pre Biometrics   Height 5' 8.5" (1.74 m)    Weight 188 lb 14.4 oz (85.7 kg)    Waist Circumference 44 inches    Hip Circumference 40 inches    Waist to Hip Ratio 1.1 %    BMI (Calculated) 28.3    Single Leg Stand 3.3 seconds              Nutrition Therapy Plan and Nutrition Goals:  Nutrition Therapy & Goals - 04/10/23 1401       Intervention Plan   Intervention Prescribe, educate and counsel regarding individualized specific dietary modifications aiming towards targeted core  components such as weight, hypertension, lipid management, diabetes, heart failure and other comorbidities.    Expected Outcomes Short Term Goal: Understand basic principles of dietary content, such as calories, fat, sodium, cholesterol and nutrients.;Short Term Goal: A plan has been developed with personal nutrition goals set during dietitian appointment.;Long Term Goal: Adherence to prescribed nutrition plan.             Nutrition Assessments:  MEDIFICTS Score Key: ?70 Need to make dietary changes  40-70 Heart Healthy Diet ? 40 Therapeutic Level Cholesterol Diet  Flowsheet Row Cardiac Rehab from 04/10/2023 in [facility] Cardiac and Pulmonary Rehab  Picture Your Plate Total Score on Admission 60      Picture Your Plate Scores: <47 Unhealthy dietary pattern with much room for improvement. 41-50 Dietary pattern unlikely to meet recommendations for good health and room for improvement. 51-60 More healthful dietary pattern, with some room for improvement.  >60 Healthy dietary pattern, although there may be some specific behaviors that could be improved.    Nutrition Goals Re-Evaluation:   Nutrition Goals Discharge (Final Nutrition Goals Re-Evaluation):   Psychosocial: Target Goals: Acknowledge presence or absence of significant depression and/or stress, maximize coping skills, provide positive support system. Participant is able to verbalize types and ability to use techniques and skills needed for reducing stress and depression.   Education: Stress, Anxiety, and Depression - Group verbal and visual presentation to define topics covered.  Reviews how body is impacted by stress, anxiety, and depression.  Also discusses healthy ways to reduce stress and to treat/manage anxiety and depression.  Written material given at graduation. Flowsheet Row Cardiac Rehab from 04/16/2023 in [facility] Cardiac and Pulmonary Rehab  Date 04/16/23  Educator SB  Instruction Review Code 1- [employer]  Understanding       Education: Sleep Hygiene -Provides group verbal and written instruction about how sleep can affect your health.  Define sleep hygiene, discuss sleep cycles and impact of sleep habits. Review good sleep hygiene tips.    Initial Review & Psychosocial Screening:  Initial Psych Review & Screening - 04/03/23 1013       Initial Review   Current issues with None Identified;Current Stress Concerns    Source of Stress Concerns Chronic Illness      Family Dynamics   Comments He can look to his wife for support. He states no depression or anxiety and does not take anything for his mood.      Barriers   Psychosocial barriers to participate  in program The patient should benefit from training in stress management and relaxation.      Screening Interventions   Interventions To provide support and resources with identified psychosocial needs;Provide feedback about the scores to participant;Encouraged to exercise    Expected Outcomes Short Term goal: Utilizing psychosocial counselor, staff and physician to assist with identification of specific Stressors or current issues interfering with healing process. Setting desired goal for each stressor or current issue identified.;Long Term Goal: Stressors or current issues are controlled or eliminated.;Short Term goal: Identification and review with participant of any Quality of Life or Depression concerns found by scoring the questionnaire.;Long Term goal: The participant improves quality of Life and PHQ9 Scores as seen by post scores and/or verbalization of changes             Quality of Life Scores:   Quality of Life - 04/10/23 1411       Quality of Life   Select Quality of Life      Quality of Life Scores   Health/Function Pre 24.97 %    Socioeconomic Pre 18.93 %    Psych/Spiritual Pre 22.93 %    Family Pre 23.8 %    GLOBAL Pre 23.13 %            Scores of 19 and below usually indicate a poorer quality of life in  these areas.  A difference of  2-3 points is a clinically meaningful difference.  A difference of 2-3 points in the total score of the Quality of Life Index has been associated with significant improvement in overall quality of life, self-image, physical symptoms, and general health in studies assessing change in quality of life.  PHQ-9: Review Flowsheet  More data exists      05/12/2023 05/05/2023 04/10/2023 01/03/2023 12/24/2022  Depression screen PHQ 2/9  Decreased Interest 1 2 2 2 1   Down, Depressed, Hopeless 1 2 2 2 2   PHQ - 2 Score 2 4 4 4 3   Altered sleeping 0 0 0 1 0  Tired, decreased energy 1 2 2 3 3   Change in appetite 1 1 0 1 1  Feeling bad or failure about yourself  1 1 2 3 2   Trouble concentrating 0 1 1 1 2   Moving slowly or fidgety/restless 0 2 2 2 3   Suicidal thoughts 0 1 0 1 1  PHQ-9 Score 5 12 11 16 15   Difficult doing work/chores Not difficult at all Somewhat difficult Somewhat difficult Somewhat difficult Somewhat difficult    Details           Interpretation of Total Score  Total Score Depression Severity:  1-4 = Minimal depression, 5-9 = Mild depression, 10-14 = Moderate depression, 15-19 = Moderately severe depression, 20-27 = Severe depression   Psychosocial Evaluation and Intervention:  Psychosocial Evaluation - 04/03/23 1013       Psychosocial Evaluation & Interventions   Interventions Relaxation education;Encouraged to exercise with the program and follow exercise prescription;Stress management education    Comments He can look to his wife for support. He states no depression or anxiety and does not take anything for his mood.    Expected Outcomes Short: Start [facility] to help with mood. Long: Maintain a healthy mental state    Continue Psychosocial Services  Follow up required by staff             Psychosocial Re-Evaluation:   Psychosocial Discharge (Final Psychosocial Re-Evaluation):   Vocational Rehabilitation: Provide vocational rehab  assistance to qualifying  candidates.   Vocational Rehab Evaluation & Intervention:   Education: Education Goals: Education classes will be provided on a variety of topics geared toward better understanding of heart health and risk factor modification. Participant will state understanding/return demonstration of topics presented as noted by education test scores.  Learning Barriers/Preferences:  Learning Barriers/Preferences - 04/03/23 1051       Learning Barriers/Preferences   Learning Barriers Hearing    Learning Preferences None             General Cardiac Education Topics:  AED/CPR: - Group verbal and written instruction with the use of models to demonstrate the basic use of the AED with the basic ABC's of resuscitation.   Anatomy and Cardiac Procedures: - Group verbal and visual presentation and models provide information about basic cardiac anatomy and function. Reviews the testing methods done to diagnose heart disease and the outcomes of the test results. Describes the treatment choices: Medical Management, Angioplasty, or Coronary Bypass Surgery for treating various heart conditions including Myocardial Infarction, Angina, Valve Disease, and Cardiac Arrhythmias.  Written material given at graduation. Flowsheet Row Cardiac Rehab from 04/16/2023 in [facility] Cardiac and Pulmonary Rehab  Education need identified 04/10/23       Medication Safety: - Group verbal and visual instruction to review commonly prescribed medications for heart and lung disease. Reviews the medication, class of the drug, and side effects. Includes the steps to properly store meds and maintain the prescription regimen.  Written material given at graduation.   Intimacy: - Group verbal instruction through game format to discuss how heart and lung disease can affect sexual intimacy. Written material given at graduation..   Know Your Numbers and Heart Failure: - Group verbal and visual instruction to  discuss disease risk factors for cardiac and pulmonary disease and treatment options.  Reviews associated critical values for Overweight/Obesity, Hypertension, Cholesterol, and Diabetes.  Discusses basics of heart failure: signs/symptoms and treatments.  Introduces Heart Failure Zone chart for action plan for heart failure.  Written material given at graduation. Flowsheet Row Cardiac Rehab from 04/16/2023 in [facility] Cardiac and Pulmonary Rehab  Education need identified 04/10/23       Infection Prevention: - Provides verbal and written material to individual with discussion of infection control including proper hand washing and proper equipment cleaning during exercise session. Flowsheet Row Cardiac Rehab from 04/16/2023 in [facility] Cardiac and Pulmonary Rehab  Date 04/03/23  Educator [facility]  Instruction Review Code 1- Verbalizes Understanding       Falls Prevention: - Provides verbal and written material to individual with discussion of falls prevention and safety. Flowsheet Row Cardiac Rehab from 04/16/2023 in [facility] Cardiac and Pulmonary Rehab  Date 04/03/23  Educator [facility]  Instruction Review Code 1- Verbalizes Understanding       Other: -Provides group and verbal instruction on various topics (see comments)   Knowledge Questionnaire Score:  Knowledge Questionnaire Score - 04/10/23 1401       Knowledge Questionnaire Score   Pre Score 18/26             Core Components/Risk Factors/Patient Goals at Admission:  Personal Goals and Risk Factors at Admission - 04/10/23 1412       Core Components/Risk Factors/Patient Goals on Admission    Weight Management Yes;Weight Loss    Intervention Weight Management: Provide education and appropriate resources to help participant work on and attain dietary goals.;Weight Management: Develop a combined nutrition and exercise program designed to reach desired caloric intake, while maintaining appropriate intake  of nutrient and fiber, sodium and fats,  and appropriate energy expenditure required for the weight goal.;Weight Management/Obesity: Establish reasonable short term and long term weight goals.;Obesity: Provide education and appropriate resources to help participant work on and attain dietary goals.    Admit Weight 188 lb 14.4 oz (85.7 kg)    Goal Weight: Short Term 183 lb (83 kg)    Goal Weight: Long Term 178 lb (80.7 kg)    Expected Outcomes Short Term: Continue to assess and modify interventions until short term weight is achieved;Long Term: Adherence to nutrition and physical activity/exercise program aimed toward attainment of established weight goal;Weight Loss: Understanding of general recommendations for a balanced deficit meal plan, which promotes 1-2 lb weight loss per week and includes a negative energy balance of [phone] kcal/d;Understanding recommendations for meals to include 15-35% energy as protein, 25-35% energy from fat, 35-60% energy from carbohydrates, less than 200mg  of dietary cholesterol, 20-35 gm of total fiber daily;Understanding of distribution of calorie intake throughout the day with the consumption of 4-5 meals/snacks    Diabetes Yes    Intervention Provide education about signs/symptoms and action to take for hypo/hyperglycemia.;Provide education about proper nutrition, including hydration, and aerobic/resistive exercise prescription along with prescribed medications to achieve blood glucose in normal ranges: Fasting glucose 65-99 mg/dL    Expected Outcomes Long Term: Attainment of HbA1C < 7%.;Short Term: Participant verbalizes understanding of the signs/symptoms and immediate care of hyper/hypoglycemia, proper foot care and importance of medication, aerobic/resistive exercise and nutrition plan for blood glucose control.    Heart Failure Yes    Intervention Provide a combined exercise and nutrition program that is supplemented with education, support and counseling about heart failure. Directed toward relieving  symptoms such as shortness of breath, decreased exercise tolerance, and extremity edema.    Expected Outcomes Improve functional capacity of life;Short term: Attendance in program 2-3 days a week with increased exercise capacity. Reported lower sodium intake. Reported increased fruit and vegetable intake. Reports medication compliance.;Short term: Daily weights obtained and reported for increase. Utilizing diuretic protocols set by physician.;Long term: Adoption of self-care skills and reduction of barriers for early signs and symptoms recognition and intervention leading to self-care maintenance.    Hypertension Yes    Intervention Provide education on lifestyle modifcations including regular physical activity/exercise, weight management, moderate sodium restriction and increased consumption of fresh fruit, vegetables, and low fat dairy, alcohol moderation, and smoking cessation.;Monitor prescription use compliance.    Expected Outcomes Short Term: Continued assessment and intervention until BP is < 140/48mm HG in hypertensive participants. < 130/51mm HG in hypertensive participants with diabetes, heart failure or chronic kidney disease.;Long Term: Maintenance of blood pressure at goal levels.    Lipids Yes    Intervention Provide education and support for participant on nutrition & aerobic/resistive exercise along with prescribed medications to achieve LDL 70mg , HDL >40mg .    Expected Outcomes Short Term: Participant states understanding of desired cholesterol values and is compliant with medications prescribed. Participant is following exercise prescription and nutrition guidelines.;Long Term: Cholesterol controlled with medications as prescribed, with individualized exercise RX and with personalized nutrition plan. Value goals: LDL < 70mg , HDL > 40 mg.             Education:Diabetes - Individual verbal and written instruction to review signs/symptoms of diabetes, desired ranges of glucose level  fasting, after meals and with exercise. Acknowledge that pre and post exercise glucose checks will be done for 3 sessions at entry of program. [employer]  Cardiac Rehab from 04/16/2023 in [facility] Cardiac and Pulmonary Rehab  Date 04/03/23  Educator [facility]  Instruction Review Code 1- Verbalizes Understanding       Core Components/Risk Factors/Patient Goals Review:    Core Components/Risk Factors/Patient Goals at Discharge (Final Review):    ITP Comments:  ITP Comments     Row Name 04/03/23 1038 04/10/23 1357 04/14/23 1033 05/01/23 0937 05/07/23 1120   ITP Comments Visit completed. Patient informed on EP and RD appointment and 6 Minute walk test. Patient also informed of patient health questionnaires on My Chart. Patient Verbalizes understanding. Visit diagnosis can be found in [facility] 02/25/2023. Patient came in with mild shortness of breath. He states it was the walk down from his car. Checked patients oxygen 94% and heart rate 63. He managed to get his breath back a little with pursed lipped breathing. Ask the patient a few more questions and it was clear his shortness of breath was not getting better. He was willing to go the [place] walk in clinic. When transitioning him to a different wheel chair he was more short of breath and oxygen saturations were going into the 80s. Informed staff that he was feeling sick the past few days and states his weight has increased. Staff at [place] took him to the ER. Completed and gym orientation. Initial ITP created and sent for review to Dr. Bethann Punches, Medical Director. First full day of exercise!  Patient was oriented to gym and equipment including functions, settings, policies, and procedures.  Patient's individual exercise prescription and treatment plan were reviewed.  All starting workloads were established based on the results of the 6 minute walk test done at initial orientation visit.  The plan for exercise progression was also introduced and  progression will be customized based on patient's performance and goals. Pt was readmitted on 5/29 after fall and had more fluid build up and pnuemonia.  He was discharged on 6/4.  He has follow up appt scheduled for 6/10.  He will need clearance from doctor prior to returning to rehab.  We have been unable to assess for goals at this time. 30 Day review completed. Medical Director ITP review done, changes made as directed, and signed approval by Medical Director.   out for medical reasons    Row Name 06/03/23 1454 06/09/23 1624 06/12/23 1610 07/02/23 1415     ITP Comments 30 Day review completed. Medical Director ITP review done, changes made as directed, and signed approval by Medical Director.   last visit in [place] Attempted to call pt to follow up with him. His last cardiac rehab session attended was 5/29. He was admitted same day for acute on chronic CHF and ascites. No answer when called pt. Unable to leave voicemail. Will try to reach pt at a later time. Unable to reach pt. Pt has not attended cardiac rehab since 5/29. Letter mailed to pt. Discharge date set for 8/1. Unable to reach pt. Pt has not attended cardiac rehab since 5/29. Letter mailed to pt. Discharge date set for 8/1.   discharged             Comments:

## 2023-07-04 DIAGNOSIS — J9621 Acute and chronic respiratory failure with hypoxia: Secondary | ICD-10-CM | POA: Diagnosis not present

## 2023-07-06 DIAGNOSIS — J9621 Acute and chronic respiratory failure with hypoxia: Secondary | ICD-10-CM | POA: Diagnosis not present

## 2023-07-09 DIAGNOSIS — L408 Other psoriasis: Secondary | ICD-10-CM | POA: Diagnosis not present

## 2023-07-09 DIAGNOSIS — T82898A Other specified complication of vascular prosthetic devices, implants and grafts, initial encounter: Secondary | ICD-10-CM | POA: Diagnosis not present

## 2023-07-09 DIAGNOSIS — E1122 Type 2 diabetes mellitus with diabetic chronic kidney disease: Secondary | ICD-10-CM | POA: Diagnosis not present

## 2023-07-09 DIAGNOSIS — U071 COVID-19: Secondary | ICD-10-CM | POA: Diagnosis not present

## 2023-07-09 DIAGNOSIS — E7800 Pure hypercholesterolemia, unspecified: Secondary | ICD-10-CM | POA: Diagnosis not present

## 2023-07-09 DIAGNOSIS — I509 Heart failure, unspecified: Secondary | ICD-10-CM | POA: Diagnosis not present

## 2023-07-09 DIAGNOSIS — N184 Chronic kidney disease, stage 4 (severe): Secondary | ICD-10-CM | POA: Diagnosis not present

## 2023-07-09 DIAGNOSIS — I25119 Atherosclerotic heart disease of native coronary artery with unspecified angina pectoris: Secondary | ICD-10-CM | POA: Diagnosis not present

## 2023-07-09 DIAGNOSIS — M1000 Idiopathic gout, unspecified site: Secondary | ICD-10-CM | POA: Diagnosis not present

## 2023-07-09 DIAGNOSIS — Z95810 Presence of automatic (implantable) cardiac defibrillator: Secondary | ICD-10-CM | POA: Diagnosis not present

## 2023-07-09 DIAGNOSIS — I10 Essential (primary) hypertension: Secondary | ICD-10-CM | POA: Diagnosis not present

## 2023-07-09 DIAGNOSIS — N261 Atrophy of kidney (terminal): Secondary | ICD-10-CM | POA: Diagnosis not present

## 2023-07-11 ENCOUNTER — Ambulatory Visit: Payer: Self-pay | Admitting: *Deleted

## 2023-07-11 NOTE — Patient Outreach (Signed)
  Care Coordination   07/11/2023 Name: Taylor [name] MRN: [id number] DOB: 29-Nov-1940   Care Coordination Outreach Attempts:  An unsuccessful telephone outreach was attempted for a scheduled appointment today.  Follow Up Plan:  Additional outreach attempts will be made to offer the patient care coordination information and services.   Encounter Outcome:  No Answer   Care Coordination Interventions:  No, not indicated    Kemper Durie, RN, MSN, [facility] Care Management Care Management Coordinator [phone]

## 2023-07-11 NOTE — Patient Outreach (Signed)
  Care Coordination   Follow Up Visit Note   07/11/2023 Name: Taylor [name] MRN: [id number] DOB: 09/24/1941  Taylor [name] is a 82 y.o. year old male who sees [name], Demetrios Isaacs, MD for primary care. I spoke with  [name] by phone today.  What matters to the patients health and wellness today?  Keep HF managed.  Continues with intermittent shortness of breath, using Oxygen and compliant with current medical treatment plan.     Goals Addressed             This Visit's Progress    Develop Plan of care for Management of CHF   On track    Care Coordination Interventions: Basic overview and discussion of pathophysiology of Heart Failure reviewed Provided education on low sodium diet Reviewed Heart Failure Action Plan in depth and provided written copy Assessed need for readable accurate scales in home Advised patient to weigh each morning after emptying bladder Discussed importance of daily weight and advised patient to weigh and record daily Reviewed role of diuretics in prevention of fluid overload and management of heart failure;         SDOH assessments and interventions completed:  No     Care Coordination Interventions:  Yes, provided   Interventions Today    Flowsheet Row Most Recent Value  Chronic Disease   Chronic disease during today's visit Congestive Heart Failure (CHF)  General Interventions   General Interventions Discussed/Reviewed General Interventions Reviewed, Doctor Visits  Doctor Visits Discussed/Reviewed Doctor Visits Reviewed, PCP, Specialist  [Dermatology 8/29, PCP 9/20]  PCP/Specialist Visits Compliance with follow-up visit  Exercise Interventions   Exercise Discussed/Reviewed Weight Managment  Weight Management Weight maintenance  [weight today 162 pounds]  Education Interventions   Education Provided Provided Education  Provided Verbal Education On Medication, When to see the doctor, Other  [Continues with [place] and Torsemide, 60mg  twice  daily.  Advised to contact this RNCM if prescriptions does not match how currently taking when he refills. Has boil on hand, dermatology appt, advised to use warm compress until appt]       Follow up plan: Follow up call scheduled for 9/24    Encounter Outcome:  Pt. Visit Completed   Kemper Durie, RN, MSN, [facility] Care Management Care Management Coordinator [phone]

## 2023-07-16 ENCOUNTER — Ambulatory Visit (INDEPENDENT_AMBULATORY_CARE_PROVIDER_SITE_OTHER): Payer: Medicare Other | Admitting: Family Medicine

## 2023-07-16 ENCOUNTER — Encounter: Payer: Self-pay | Admitting: Family Medicine

## 2023-07-16 VITALS — BP 116/75 | HR 61 | Temp 98.2°F | Resp 20 | Ht 70.0 in | Wt 170.8 lb

## 2023-07-16 DIAGNOSIS — M1A0720 Idiopathic chronic gout, left ankle and foot, without tophus (tophi): Secondary | ICD-10-CM

## 2023-07-16 MED ORDER — PREDNISONE 10 MG PO TABS
ORAL_TABLET | ORAL | 0 refills | Status: AC
Start: 2023-07-16 — End: 2023-07-26

## 2023-07-16 NOTE — Assessment & Plan Note (Signed)
Sudden onset of left foot pain and swelling, likely due to gout given the patient's history. No recent changes in diet or alcohol consumption. Patient has not been taking allopurinol for gout for the past two years. -Prescribe prednisone taper:40mg  for 2 days, 20mg  for 2 days, 10mg  for 2 days, and 5mg  for 2 days. -Check uric acid level (last level was within normal range at less than 6 in 2022). -Follow up with primary care physician as scheduled.

## 2023-07-16 NOTE — Patient Instructions (Signed)
VISIT SUMMARY:  Dear [name], during your recent visit, we discussed your new onset left foot pain and swelling, which is likely due to a gout flare. We also reviewed your ongoing conditions including heart failure, hypertension, kidney disease, and arthritis. Your blood pressure is well controlled, and there has been some recent improvement in your kidney function. Your weight has decreased, which we will continue to monitor due to your heart failure.  YOUR PLAN:  -ACUTE GOUT FLARE: Gout is a type of arthritis that causes painful inflammation in the joints, often in the big toe. We believe your foot pain and swelling is due to a gout flare. I have prescribed a prednisone taper to help reduce the inflammation and pain. We will also check your uric acid level, which can indicate the severity of gout.  -CHRONIC HEART FAILURE: Heart failure means your heart isn't pumping blood as well as it should be. You should continue to monitor your weight closely due to your heart failure and continue with your current management plan. Please also follow up with your cardiologist.  -HYPERTENSION: Hypertension, or high blood pressure, can lead to serious health problems if not managed. Your blood pressure readings are well controlled, so please continue with your current management plan.  -CHRONIC KIDNEY DISEASE: Chronic kidney disease means your kidneys are damaged and can't filter blood as well as they should. Your recent lab results show some improvement in your kidney function. Please continue with your current management plan and monitor your kidney function closely.  -ARTHRITIS: Arthritis is inflammation of one or more of your joints that causes pain and stiffness. You have a history of knee arthritis but currently have no complaints. Please continue with your current management plan.  INSTRUCTIONS:  Please start the prednisone taper as prescribed for your gout flare.   Take 40mg  for 2 days, 20mg  for 2  days, 10mg  for 2 days and 5mg  for 2 days.    Monitor your weight closely due to your heart failure and continue with your current management plans for your other conditions. Follow up with your primary care physician as scheduled and with your cardiologist. If your foot pain and swelling do not improve or if you have any concerns, please contact our office.  Please follow up with Dr. Sherrie Mustache

## 2023-07-16 NOTE — Addendum Note (Signed)
Addended by: Bing Neighbors on: 07/16/2023 05:05 PM   Modules accepted: Level of Service

## 2023-07-16 NOTE — Progress Notes (Signed)
Established patient visit   Patient: Taylor [name]   DOB: 1941-10-19   82 y.o. Male  MRN: [id number] Visit Date: 07/16/2023  Today's healthcare provider: Ronnald Ramp, MD   Chief Complaint  Patient presents with   Foot Swelling    Patient c/o left foot pain and swelling since yesterday. He denies any rash, cuts or injuries.  Patient requesting a new prescription for torsemide 60 mg.    Subjective     HPI     Foot Swelling    Additional comments: Patient c/o left foot pain and swelling since yesterday. He denies any rash, cuts or injuries.  Patient requesting a new prescription for torsemide 60 mg.       Last edited by Myles Lipps, CMA on 07/16/2023  2:30 PM.       Discussed the use of AI scribe software for clinical note transcription with the patient, who gave verbal consent to proceed.  History of Present Illness   Taylor [name], an 82 year old patient with a significant medical history including abdominal aortic aneurysm, abdominal ascites, systolic and diastolic heart failure, aortic atherosclerosis, knee arthritis, atherosclerotic heart disease, atrial fibrillation, coronary artery disease with percutaneous coronary angioplasty, defibrillator placement, headaches, back pain, hypertension, and gout, presents with new onset left foot pain and swelling. The patient reports that the pain began the day prior to the consultation, with no associated cuts, injuries, or rashes. The pain is located on the top of the foot and is particularly severe upon waking, making the first few steps of the day difficult. The pain reportedly improves somewhat with movement but persists throughout the day. The patient denies any recent changes in diet or alcohol consumption.  The patient also reports a history of gout, but has not experienced a flare in approximately two years and has not been taking allopurinol, a medication for gout, during this time. The patient's spouse  notes that the patient had been more active than usual in the days leading up to the onset of foot pain, suggesting that increased activity may have contributed to the current symptoms.  The patient's medical history also includes kidney disease, with recent lab results indicating a creatinine level in the range of two to two and a half. The patient's kidney function has shown some recent improvement, with a GFR increasing to 24. The patient uses oxygen at night but does not require it during the day. The patient's blood pressure is well-controlled, and recent weight has decreased from 180 lbs in May to 162 lbs at the time of consultation.         Medications: Outpatient Medications Prior to Visit  Medication Sig   acetaminophen (TYLENOL) 500 MG tablet Take 500 mg by mouth every 6 (six) hours as needed for mild pain.   albuterol (VENTOLIN HFA) 108 (90 Base) MCG/ACT inhaler Inhale 2 puffs into the lungs every 6 (six) hours as needed for wheezing or shortness of breath.   allopurinol (ZYLOPRIM) 300 MG tablet Take 150 mg by mouth daily.   apixaban (ELIQUIS) 2.5 MG TABS tablet Take 1 tablet (2.5 mg total) by mouth 2 (two) times daily.   aspirin 81 MG EC tablet Take 81 mg by mouth daily.    atorvastatin (LIPITOR) 40 MG tablet TAKE 1 TABLET(40 MG) BY MOUTH DAILY   benzonatate (TESSALON) 200 MG capsule Take 1 capsule (200 mg total) by mouth 3 (three) times daily as needed for cough.   carvedilol (COREG) 6.25 MG tablet  Take 1 tablet (6.25 mg total) by mouth 2 (two) times daily with a meal.   dextromethorphan-guaiFENesin (MUCINEX DM) 30-600 MG 12hr tablet Take 1 tablet by mouth 2 (two) times daily as needed for cough.   ipratropium-albuterol (DUONEB) 0.5-2.5 (3) MG/3ML SOLN Inhale 3 mLs into the lungs every 4 (four) hours as needed (wheezing, shob).   isosorbide mononitrate (IMDUR) 30 MG 24 hr tablet TAKE 1/2 TABLET(15 MG) BY MOUTH DAILY (Patient taking differently: Take 15 mg by mouth daily.)    JARDIANCE 10 MG TABS tablet Take 10 mg by mouth daily.   Magnesium 200 MG TABS Take 1 tablet (200 mg total) by mouth in the morning and at bedtime.   pantoprazole (PROTONIX) 40 MG tablet TAKE 1 TABLET BY MOUTH EVERY DAY   Potassium Chloride ER 20 MEQ TBCR Take 1 tablet by mouth daily.   potassium chloride SA (KLOR-CON M) 20 MEQ tablet Take 20 mEq by mouth daily.   spironolactone (ALDACTONE) 25 MG tablet Take 1 tablet (25 mg total) by mouth daily.   torsemide 60 MG TABS Take 60 mg by mouth 2 (two) times daily.   triamcinolone ointment (KENALOG) 0.1 % APPLY TOPICALLY TWICE DAILY AS DIRECTED   No facility-administered medications prior to visit.    Review of Systems  Last metabolic panel Lab Results  Component Value Date   GLUCOSE 114 (H) 05/05/2023   NA 141 05/05/2023   K 4.2 05/05/2023   CL 99 05/05/2023   CO2 25 05/05/2023   BUN 53 (H) 05/05/2023   CREATININE 2.62 (H) 05/05/2023   EGFR 24 (L) 05/05/2023   CALCIUM 8.6 05/05/2023   PHOS 5.4 (H) 04/05/2023   PROT 6.7 05/05/2023   ALBUMIN 3.1 (L) 05/05/2023   LABGLOB 3.6 05/05/2023   AGRATIO 0.9 05/05/2023   BILITOT 1.4 (H) 05/05/2023   ALKPHOS 235 (H) 05/05/2023   AST 34 05/05/2023   ALT 12 05/05/2023   ANIONGAP 10 04/29/2023        Objective    BP 116/75 (BP Location: Left Arm, Patient Position: Sitting, Cuff Size: Normal)   Pulse 61   Temp 98.2 F (36.8 C) (Temporal)   Resp 20   Ht 5\' 10"  (1.778 m)   Wt 170 lb 12.8 oz (77.5 kg)   BMI 24.51 kg/m     Physical Exam Constitutional:      Comments: Chronically ill appearing male with pain while ambulating to exam room   Cardiovascular:     Pulses:          Dorsalis pedis pulses are 2+ on the left side.       Posterior tibial pulses are 1+ on the left side.  Pulmonary:     Effort: Pulmonary effort is normal.     Comments: Breathing comfortably on RA Feet:     Left foot:     Skin integrity: Erythema, warmth and dry skin present. No ulcer, blister or skin  breakdown.  Neurological:     Mental Status: He is alert.       No results found for any visits on 07/16/23.  Assessment & Plan     Problem List Items Addressed This Visit     Gout - Primary    Sudden onset of left foot pain and swelling, likely due to gout given the patient's history. No recent changes in diet or alcohol consumption. Patient has not been taking allopurinol for gout for the past two years. -Prescribe prednisone taper:40mg  for 2 days, 20mg  for 2  days, 10mg  for 2 days, and 5mg  for 2 days. -Check uric acid level (last level was within normal range at less than 6 in 2022). -Follow up with primary care physician as scheduled.      Relevant Medications   predniSONE (DELTASONE) 10 MG tablet   Other Relevant Orders   Uric acid      Chronic Heart Failure Patient has a history of systolic and diastolic heart failure. He uses oxygen at night and has difficulty walking long distances due to shortness of breath. Recent weight gain noted. -Monitor weight closely due to heart failure. -Continue current management and follow up with cardiologist.  Hypertension Blood pressure readings are well controlled, ranging from 120s to 137. -Continue current management.  Chronic Kidney Disease Elevated creatinine levels noted, indicating impaired kidney function. Recent improvement in GFR noted. -Continue current management and monitor kidney function closely.  Arthritis Patient has a history of knee arthritis. No current complaints. -Continue current management.         No follow-ups on file.         Ronnald Ramp, MD  [facility] [phone] (phone) [phone] (fax)  [facility]

## 2023-07-21 ENCOUNTER — Ambulatory Visit: Payer: Medicare Other | Attending: Family | Admitting: Family

## 2023-07-21 ENCOUNTER — Encounter: Payer: Self-pay | Admitting: Family

## 2023-07-21 VITALS — BP 122/87 | HR 76 | Wt 178.0 lb

## 2023-07-21 DIAGNOSIS — Z95810 Presence of automatic (implantable) cardiac defibrillator: Secondary | ICD-10-CM | POA: Insufficient documentation

## 2023-07-21 DIAGNOSIS — E1122 Type 2 diabetes mellitus with diabetic chronic kidney disease: Secondary | ICD-10-CM | POA: Diagnosis not present

## 2023-07-21 DIAGNOSIS — G4733 Obstructive sleep apnea (adult) (pediatric): Secondary | ICD-10-CM | POA: Diagnosis not present

## 2023-07-21 DIAGNOSIS — I5022 Chronic systolic (congestive) heart failure: Secondary | ICD-10-CM

## 2023-07-21 DIAGNOSIS — I2510 Atherosclerotic heart disease of native coronary artery without angina pectoris: Secondary | ICD-10-CM | POA: Insufficient documentation

## 2023-07-21 DIAGNOSIS — Z9861 Coronary angioplasty status: Secondary | ICD-10-CM

## 2023-07-21 DIAGNOSIS — N184 Chronic kidney disease, stage 4 (severe): Secondary | ICD-10-CM | POA: Diagnosis not present

## 2023-07-21 DIAGNOSIS — I130 Hypertensive heart and chronic kidney disease with heart failure and stage 1 through stage 4 chronic kidney disease, or unspecified chronic kidney disease: Secondary | ICD-10-CM | POA: Diagnosis not present

## 2023-07-21 DIAGNOSIS — I10 Essential (primary) hypertension: Secondary | ICD-10-CM | POA: Diagnosis not present

## 2023-07-21 DIAGNOSIS — I4891 Unspecified atrial fibrillation: Secondary | ICD-10-CM | POA: Diagnosis not present

## 2023-07-21 DIAGNOSIS — I480 Paroxysmal atrial fibrillation: Secondary | ICD-10-CM

## 2023-07-21 DIAGNOSIS — N189 Chronic kidney disease, unspecified: Secondary | ICD-10-CM | POA: Diagnosis not present

## 2023-07-21 DIAGNOSIS — G4730 Sleep apnea, unspecified: Secondary | ICD-10-CM | POA: Diagnosis not present

## 2023-07-21 MED ORDER — FUROSCIX 80 MG/10ML ~~LOC~~ CTKT: 80.0000 mg | CARTRIDGE | SUBCUTANEOUS | Status: DC

## 2023-07-21 MED ORDER — POTASSIUM CHLORIDE CRYS ER 20 MEQ PO TBCR: 20.0000 meq | EXTENDED_RELEASE_TABLET | Freq: Every day | ORAL | 3 refills | Status: DC

## 2023-07-21 MED ORDER — TORSEMIDE 20 MG PO TABS: 60.0000 mg | ORAL_TABLET | Freq: Two times a day (BID) | ORAL | 5 refills | Status: DC

## 2023-07-21 NOTE — Progress Notes (Signed)
PCP: Mila Merry, MD (last seen 08/24) Primary Cardiologist: Marcina Millard, MD (last seen 06/24) AD[facility] provider: Florestine Avers, MD (last seen 07/24)   HPI:  Taylor [name] is a 82 y/o male with a history of AAA, bladder cancer, DM, atrial fibrillation, CAD s/p DES, hyperlipidemia, CKD, restrictive lung disease,HTN, stroke, liver cirrhosis, AICD, GERD, melanoma, pancreatitis, sleep apnea, VF and chronic heart failure.   Was in the ED 10/27/22 due to nausea epigastric discomfort some shortness of breathAdmitted 10/30/22 due to DOE and orthopnea x 7 days. Chest xray showed cardiomegaly and central vascular congestion. Cardiology & nephrology consults obtained. Given IV lasix. Cr worsened with diuretics so discontinued and put on gentle fluids in setting of HF. Placed on oxygen. Discharged after 3 days.   Admitted 02/25/23 due to worsening SOB due to acute/ chronic HF. Placed on oxygen. Chest x-ray with enlargement of cardiac silhouette and mild pulmonary edema. CT of the chest abdomen pelvis with moderate to severe ascites with anasarca. Paracentesis yielded 6L fluids. Jardiance held due to AKI.    Admitted 04/03/23 due to worsening lower extremity swelling, abdominal distention, shortness of breath and productive cough for last few days. Had not been taking diuretic per cardiology. Needed 4L supplemental oxygen. Chest x-ray shows cardiomegaly with mild to moderate pulmonary edema and bilateral pleural effusion. CT abdomen shows moderate ascites with anasarca, nodular liver and severe atrophy of left kidney. IV diuresed. Underwent ultrasound-guided paracentesis 6.6 L of clear fluid drained. Patient was continued on IV Lasix. Admitted 04/23/23 due to weight gain, shortness of breath, and falling at [facility] hardware store. IV diuresed. S/p paracentesis with removal of 6.8 L of amber-colored fluid. Placed back on oral diuretics.   Echo 11/13/19: EF 20-25% with mild LAE/RAE and moderate Taylor/TR Echo 08/10/20: EF  35-40% along with mild Taylor and borderline dilatation of aortic root at 39 mm Echo 10/30/22: EF of <20% along with mild LVH, severe LAE, severe Taylor, mild AR and moderate/severe TR.   Stress test 05/07/21: Moderate to severe LV systolic dysfunction  Large perfusion abnormality of severe intensity in the anterior apical  myocardial perfusion distribution consistent with previous infarct and/or  scar without evidence of myocardial ischemia   RHC/LHC 10/02/12: Left Main:  normal       LAD system:  significant       LCX system:  significant       RCA system:  normal       Number of vessels with significant CAD:  2 - Vessel   Left Ventriculogram       Ejection Fraction:   65%   He presents today for a HF follow-up visit with a chief complaint of moderate shortness of breath with minimal exertion. Chronic in nature although has worsened over the last several days. Has associated fatigue, intermittent dizziness, abdominal distention and chronic difficulty sleeping with this. Denies chest pain, cough or palpitations.   Has been taking prednisone for the last 5 days for gout and feels like this is when his symptoms started getting worse. Eating "everything" during this time as well. Has one more day of prednisone left.   ROS: All systems negative except as listed in HPI, PMH and Problem List.  SH:  Social History   Socioeconomic History   Marital status: Married    Spouse name: [name]   Number of children: 3   Years of education: Not on file   Highest education level: 12th grade  Occupational History   Occupation: retired  Comment: previously worked as a Chief Financial Officer  Tobacco Use   Smoking status: Former    Current packs/day: 0.00    Average packs/day: 1 pack/day for 30.0 years (30.0 ttl pk-yrs)    Types: Cigarettes    Start date: 11/25/1969    Quit date: 11/26/1999    Years since quitting: 23.6   Smokeless tobacco: Never  Vaping Use   Vaping status: Never Used  Substance and Sexual  Activity   Alcohol use: No   Drug use: No   Sexual activity: Not Currently  Other Topics Concern   Not on file  Social History Narrative   Lives at home with his family.  Independent at baseline.   Social Determinants of Health   Financial Resource Strain: Low Risk  (05/12/2023)   Overall Financial Resource Strain (CARDIA)    Difficulty of Paying Living Expenses: Not hard at all  Food Insecurity: No Food Insecurity (05/12/2023)   Hunger Vital Sign    Worried About Running Out of Food in the Last Year: Never true    Ran Out of Food in the Last Year: Never true  Transportation Needs: No Transportation Needs (05/12/2023)   PRAPARE - Administrator, Civil Service (Medical): No    Lack of Transportation (Non-Medical): No  Physical Activity: Insufficiently Active (05/12/2023)   Exercise Vital Sign    Days of Exercise per Week: 4 days    Minutes of Exercise per Session: 30 min  Stress: Stress Concern Present (05/12/2023)   [employer] of Occupational Health - Occupational Stress Questionnaire    Feeling of Stress : To some extent  Social Connections: Moderately Isolated (05/12/2023)   Social Connection and Isolation Panel [NHANES]    Frequency of Communication with Friends and Family: Once a week    Frequency of Social Gatherings with Friends and Family: Once a week    Attends Religious Services: More than 4 times per year    Active Member of [employer] or Organizations: No    Attends Banker Meetings: Never    Marital Status: Married  Catering manager Violence: Not At Risk (05/12/2023)   Humiliation, Afraid, Rape, and Kick questionnaire    Fear of Current or Ex-Partner: No    Emotionally Abused: No    Physically Abused: No    Sexually Abused: No    FH:  Family History  Problem Relation Age of Onset   Cancer Mother        Melanoma skin cancer   Heart attack Father 19   Cancer Father        throat cancer   Arthritis Brother     Past Medical History:   Diagnosis Date   AAA (abdominal aortic aneurysm) (HCC) 06/03/2007   [facility]; Dr. Hart [place]   AICD (automatic cardioverter/defibrillator) present    Arrhythmia    atrial fibrillation   Barrett's esophagus    Bladder cancer (HCC)    Bradycardia    CAD (coronary artery disease)    CAP (community acquired pneumonia) 11/13/2019   CHF (congestive heart failure) (HCC)    Cluster headache    COVID-19 09/27/2021   DDD (degenerative disc disease), lumbar    Diabetes mellitus without complication (HCC)    Dyspnea    WITH EXERTION   Edema    LEFT ANKLE   Fracture of skull base (HCC) 1997   due to fall   GERD (gastroesophageal reflux disease)    Gout    History of bladder cancer 12/1995  Hyperlipidemia    Hypertension    Hypocalcemia 04/19/2020   Possibly secondary to diuretics.   Low=5.9 04/19/2020   Malignant melanoma (HCC) 12/2012   right dorsal forearm excised   Myocardial infarction [facility])    LAST 2014   Osteoarthritis of knee    Other specified complication of vascular prosthetic devices, implants and grafts, initial encounter (HCC) 08/22/2021   Pacemaker 10/10/2006   Pancreatitis 11/22/2019   Pneumonia    2016   Pre-diabetes    Psoriasis    Rib fracture 1997   due to fall   Sleep apnea    CPAP   Stroke (HCC)    Venous incompetence     Current Outpatient Medications  Medication Sig Dispense Refill   acetaminophen (TYLENOL) 500 MG tablet Take 500 mg by mouth every 6 (six) hours as needed for mild pain.     albuterol (VENTOLIN HFA) 108 (90 Base) MCG/ACT inhaler Inhale 2 puffs into the lungs every 6 (six) hours as needed for wheezing or shortness of breath. 8 g 2   allopurinol (ZYLOPRIM) 300 MG tablet Take 150 mg by mouth daily.     apixaban (ELIQUIS) 2.5 MG TABS tablet Take 1 tablet (2.5 mg total) by mouth 2 (two) times daily. 60 tablet 1   aspirin 81 MG EC tablet Take 81 mg by mouth daily.      atorvastatin (LIPITOR) 40 MG tablet TAKE 1 TABLET(40  MG) BY MOUTH DAILY 90 tablet 1   benzonatate (TESSALON) 200 MG capsule Take 1 capsule (200 mg total) by mouth 3 (three) times daily as needed for cough. 20 capsule 0   carvedilol (COREG) 6.25 MG tablet Take 1 tablet (6.25 mg total) by mouth 2 (two) times daily with a meal. 60 tablet 0   dextromethorphan-guaiFENesin (MUCINEX DM) 30-600 MG 12hr tablet Take 1 tablet by mouth 2 (two) times daily as needed for cough. 30 tablet 0   ipratropium-albuterol (DUONEB) 0.5-2.5 (3) MG/3ML SOLN Inhale 3 mLs into the lungs every 4 (four) hours as needed (wheezing, shob). 360 mL 1   isosorbide mononitrate (IMDUR) 30 MG 24 hr tablet TAKE 1/2 TABLET(15 MG) BY MOUTH DAILY (Patient taking differently: Take 15 mg by mouth daily.) 45 tablet 2   JARDIANCE 10 MG TABS tablet Take 10 mg by mouth daily.     Magnesium 200 MG TABS Take 1 tablet (200 mg total) by mouth in the morning and at bedtime. 60 tablet 5   pantoprazole (PROTONIX) 40 MG tablet TAKE 1 TABLET BY MOUTH EVERY DAY 90 tablet 4   Potassium Chloride ER 20 MEQ TBCR Take 1 tablet by mouth daily.     potassium chloride SA (KLOR-CON M) 20 MEQ tablet Take 20 mEq by mouth daily.     predniSONE (DELTASONE) 10 MG tablet Take 4 tablets (40 mg total) by mouth daily with breakfast for 2 days, THEN 2 tablets (20 mg total) daily with breakfast for 2 days, THEN 1 tablet (10 mg total) daily with breakfast for 2 days, THEN 1 tablet (10 mg total) daily with breakfast for 2 days, THEN 0.5 tablets (5 mg total) daily with breakfast for 2 days. 17 tablet 0   spironolactone (ALDACTONE) 25 MG tablet Take 1 tablet (25 mg total) by mouth daily. 30 tablet 1   torsemide 60 MG TABS Take 60 mg by mouth 2 (two) times daily. 60 tablet 1   triamcinolone ointment (KENALOG) 0.1 % APPLY TOPICALLY TWICE DAILY AS DIRECTED 454 g 3   No current  facility-administered medications for this visit.   Vitals:   07/21/23 1509  BP: 122/87  Pulse: 76  SpO2: 97%  Weight: 178 lb (80.7 kg)   Wt Readings  from Last 3 Encounters:  07/21/23 178 lb (80.7 kg)  07/16/23 170 lb 12.8 oz (77.5 kg)  05/12/23 180 lb (81.6 kg)   Lab Results  Component Value Date   CREATININE 2.62 (H) 05/05/2023   CREATININE 2.77 (H) 04/29/2023   CREATININE 2.86 (H) 04/28/2023   PHYSICAL EXAM:  General:  Well appearing. No resp difficulty HEENT: normal Neck: supple. JVP elevated. No lymphadenopathy or thryomegaly appreciated. Cor: PMI normal. Regular rate & rhythm. No rubs, gallops or murmurs. Lungs: clear Abdomen: soft, nontender, distended. No hepatosplenomegaly. No bruits or masses. Good bowel sounds. Extremities: no cyanosis, clubbing, rash, 2+ pitting edema bilateral lower legs Neuro: alert & orientedx3, cranial nerves grossly intact. Moves all 4 extremities w/o difficulty. Affect pleasant.   ECG: not done  ReDs: 48%   ASSESSMENT & PLAN:  1: Ischemic heart failure with reduced ejection fraction- - NYHA class III - fluid overloaded with worsening symptoms, gradual weight gain and elevated ReDs reading - weighing daily; weight chart reviewed and shows weight gain of 12 pounds the month of August; reminded to call for an overnight weight gain of > 2 pounds or a weekly weight gain of > 5 pounds - weight up 4 pounds from last visit here 4 months ago - Echo 11/13/19: EF 20-25% with mild LAE/RAE and moderate Taylor/TR - Echo 08/10/20: EF 35-40% along with mild Taylor and borderline dilatation of aortic root at 39 mm - Echo 10/30/22: EF of <20% along with mild LVH, severe LAE, severe Taylor, mild AR and moderate/severe TR.  - Stress test 05/07/21 showed moderate to severe LV systolic dysfunction, large perfusion abnormality of severe intensity in the anterior apical myocardial perfusion distribution consistent with previous infarct and/or scar without evidence of myocardial ischemia  - Reds reading 48% - symptoms worsened after he started prednisone - furoscix given to use tomorrow; hold torsemide tomorrow with using  furoscix - take 2 potassium tablets tomorrow with furoscix - if symptoms persist, may consider abdominal ultrasound/ paracentesis - not adding salt to his food - continue carvedilol 6.25mg  BID - continue torsemide 60mg  BID - continue jardiance 10mg  daily - renal function does not currently allow for entresto - saw [facility] Allena Katz) 07/24 - has AICD - Mg 04/05/23 was 2.3 - BNP 04/23/23 was 1516.7  2: HTN- - BP 122/87 - saw PCP Sherrie Mustache) 08/24 - BMP 07/01/23 reviewed and showed sodium 139, potassium 4.0, creatinine 2.65 and GFR 24 - check BMP later this week - saw nephrology Suezanne Jacquet) 08/24  3: Atrial fibrillation- - saw cardiology (Paraschos) 06/24 - continue apixaban 2.5mg  BID - has pacemaker with OptiVol capacity for fluid monitoring - battery change out done 02/13/23  4: CAD- - CABG 2007 - continue atorvastatin 40mg  daily - RHC/LHC done 10/02/12:      Left Main:  normal       LAD system:  significant       LCX system:  significant       RCA system:  normal       Left Ventriculogram Ejection Fraction:   65%   5: DM with CKD-  - A1c 05/05/23 was 7.1%  6: Sleep apnea- - wearing CPAP nightly - saw pulmonology Karna Christmas) 03/24 - wearing oxygen at 2L with his CPAP  Return in 2-3 days, sooner if needed.

## 2023-07-21 NOTE — Progress Notes (Signed)
   07/21/23 1519  ReDS Vest / Clip  Station Marker C  Ruler Value 37  ReDS Value Range (!) > 40  ReDS Actual Value 48

## 2023-07-21 NOTE — Patient Instructions (Addendum)
Furoscix 80mg  tomorrow with Potassium (2 tablets) tomorrow.  Please call our clinic if you need assistance placing Furoscix. [phone]   Follow up appointment with Inetta Fermo in 2 weeks.

## 2023-07-24 DIAGNOSIS — D492 Neoplasm of unspecified behavior of bone, soft tissue, and skin: Secondary | ICD-10-CM | POA: Diagnosis not present

## 2023-07-24 DIAGNOSIS — C44629 Squamous cell carcinoma of skin of left upper limb, including shoulder: Secondary | ICD-10-CM | POA: Diagnosis not present

## 2023-07-24 DIAGNOSIS — Z85828 Personal history of other malignant neoplasm of skin: Secondary | ICD-10-CM | POA: Diagnosis not present

## 2023-07-24 DIAGNOSIS — L400 Psoriasis vulgaris: Secondary | ICD-10-CM | POA: Diagnosis not present

## 2023-07-25 ENCOUNTER — Ambulatory Visit: Admission: RE | Admit: 2023-07-25 | Payer: Medicare Other | Source: Ambulatory Visit

## 2023-07-25 ENCOUNTER — Encounter: Payer: Self-pay | Admitting: Family

## 2023-07-25 ENCOUNTER — Ambulatory Visit: Payer: Medicare Other | Admitting: Family

## 2023-07-25 ENCOUNTER — Other Ambulatory Visit: Payer: Self-pay | Admitting: Family

## 2023-07-25 VITALS — BP 112/69 | HR 74 | Wt 177.0 lb

## 2023-07-25 DIAGNOSIS — E1122 Type 2 diabetes mellitus with diabetic chronic kidney disease: Secondary | ICD-10-CM | POA: Diagnosis not present

## 2023-07-25 DIAGNOSIS — G4730 Sleep apnea, unspecified: Secondary | ICD-10-CM | POA: Insufficient documentation

## 2023-07-25 DIAGNOSIS — N184 Chronic kidney disease, stage 4 (severe): Secondary | ICD-10-CM

## 2023-07-25 DIAGNOSIS — K219 Gastro-esophageal reflux disease without esophagitis: Secondary | ICD-10-CM | POA: Insufficient documentation

## 2023-07-25 DIAGNOSIS — Z7901 Long term (current) use of anticoagulants: Secondary | ICD-10-CM | POA: Diagnosis not present

## 2023-07-25 DIAGNOSIS — G4733 Obstructive sleep apnea (adult) (pediatric): Secondary | ICD-10-CM | POA: Diagnosis not present

## 2023-07-25 DIAGNOSIS — K7460 Unspecified cirrhosis of liver: Secondary | ICD-10-CM | POA: Diagnosis not present

## 2023-07-25 DIAGNOSIS — I4891 Unspecified atrial fibrillation: Secondary | ICD-10-CM | POA: Insufficient documentation

## 2023-07-25 DIAGNOSIS — Z7984 Long term (current) use of oral hypoglycemic drugs: Secondary | ICD-10-CM | POA: Diagnosis not present

## 2023-07-25 DIAGNOSIS — Z8673 Personal history of transient ischemic attack (TIA), and cerebral infarction without residual deficits: Secondary | ICD-10-CM | POA: Insufficient documentation

## 2023-07-25 DIAGNOSIS — J811 Chronic pulmonary edema: Secondary | ICD-10-CM | POA: Diagnosis not present

## 2023-07-25 DIAGNOSIS — Z87891 Personal history of nicotine dependence: Secondary | ICD-10-CM | POA: Diagnosis not present

## 2023-07-25 DIAGNOSIS — I5022 Chronic systolic (congestive) heart failure: Secondary | ICD-10-CM | POA: Insufficient documentation

## 2023-07-25 DIAGNOSIS — I252 Old myocardial infarction: Secondary | ICD-10-CM | POA: Insufficient documentation

## 2023-07-25 DIAGNOSIS — I2510 Atherosclerotic heart disease of native coronary artery without angina pectoris: Secondary | ICD-10-CM

## 2023-07-25 DIAGNOSIS — N189 Chronic kidney disease, unspecified: Secondary | ICD-10-CM | POA: Insufficient documentation

## 2023-07-25 DIAGNOSIS — Z8551 Personal history of malignant neoplasm of bladder: Secondary | ICD-10-CM | POA: Diagnosis not present

## 2023-07-25 DIAGNOSIS — I480 Paroxysmal atrial fibrillation: Secondary | ICD-10-CM | POA: Diagnosis not present

## 2023-07-25 DIAGNOSIS — Z8679 Personal history of other diseases of the circulatory system: Secondary | ICD-10-CM | POA: Diagnosis not present

## 2023-07-25 DIAGNOSIS — E785 Hyperlipidemia, unspecified: Secondary | ICD-10-CM | POA: Diagnosis not present

## 2023-07-25 DIAGNOSIS — I10 Essential (primary) hypertension: Secondary | ICD-10-CM

## 2023-07-25 DIAGNOSIS — Z85820 Personal history of malignant melanoma of skin: Secondary | ICD-10-CM | POA: Diagnosis not present

## 2023-07-25 DIAGNOSIS — Z951 Presence of aortocoronary bypass graft: Secondary | ICD-10-CM | POA: Insufficient documentation

## 2023-07-25 DIAGNOSIS — Z9981 Dependence on supplemental oxygen: Secondary | ICD-10-CM | POA: Diagnosis not present

## 2023-07-25 DIAGNOSIS — Z9861 Coronary angioplasty status: Secondary | ICD-10-CM | POA: Diagnosis not present

## 2023-07-25 DIAGNOSIS — Z79899 Other long term (current) drug therapy: Secondary | ICD-10-CM | POA: Diagnosis not present

## 2023-07-25 DIAGNOSIS — I130 Hypertensive heart and chronic kidney disease with heart failure and stage 1 through stage 4 chronic kidney disease, or unspecified chronic kidney disease: Secondary | ICD-10-CM | POA: Diagnosis not present

## 2023-07-25 DIAGNOSIS — R140 Abdominal distension (gaseous): Secondary | ICD-10-CM

## 2023-07-25 DIAGNOSIS — Z95810 Presence of automatic (implantable) cardiac defibrillator: Secondary | ICD-10-CM | POA: Insufficient documentation

## 2023-07-25 LAB — BASIC METABOLIC PANEL
Anion gap: 8 (ref 5–15)
BUN: 62 mg/dL — ABNORMAL HIGH (ref 8–23)
CO2: 23 mmol/L (ref 22–32)
Calcium: 7.2 mg/dL — ABNORMAL LOW (ref 8.9–10.3)
Chloride: 107 mmol/L (ref 98–111)
Creatinine, Ser: 1.91 mg/dL — ABNORMAL HIGH (ref 0.61–1.24)
GFR, Estimated: 35 mL/min — ABNORMAL LOW (ref >60)
Glucose, Bld: 141 mg/dL — ABNORMAL HIGH (ref 70–99)
Potassium: 3 mmol/L — ABNORMAL LOW (ref 3.5–5.1)
Sodium: 138 mmol/L (ref 135–145)

## 2023-07-25 LAB — BRAIN NATRIURETIC PEPTIDE: B Natriuretic Peptide: 746.2 pg/mL — ABNORMAL HIGH (ref 0.0–100.0)

## 2023-07-25 MED ORDER — POTASSIUM CHLORIDE CRYS ER 20 MEQ PO TBCR
EXTENDED_RELEASE_TABLET | ORAL | Status: AC
  Filled 2023-07-25: qty 2

## 2023-07-25 MED ORDER — FUROSEMIDE 10 MG/ML IJ SOLN
INTRAMUSCULAR | Status: AC
  Filled 2023-07-25: qty 8

## 2023-07-25 MED ORDER — FUROSEMIDE 10 MG/ML IJ SOLN
80.0000 mg | Freq: Once | INTRAMUSCULAR | Status: AC
  Administered 2023-07-25: 80 mg via INTRAVENOUS

## 2023-07-25 MED ORDER — POTASSIUM CHLORIDE CRYS ER 20 MEQ PO TBCR: 40.0000 meq | EXTENDED_RELEASE_TABLET | Freq: Every day | ORAL | Status: DC

## 2023-07-25 MED ORDER — POTASSIUM CHLORIDE CRYS ER 20 MEQ PO TBCR
40.0000 meq | EXTENDED_RELEASE_TABLET | Freq: Once | ORAL | Status: AC
  Administered 2023-07-25: 40 meq via ORAL

## 2023-07-25 NOTE — Progress Notes (Signed)
US Paracentesis order placed and scheduled for Tuesday, 9/3 @ 2:30PM @ the [facility] Called and notified pt of appt. Pt understood and aware of arriving 15 mins early at [facility] for Check-In.

## 2023-07-25 NOTE — Progress Notes (Signed)
PCP: Mila Merry, MD (last seen 08/24) Primary Cardiologist: Marcina Millard, MD (last seen 06/24) ADHF provider: Florestine Avers, MD (last seen 07/24)   HPI:  Taylor [name] is a 82 y/o male with a history of AAA, bladder cancer, DM, atrial fibrillation, CAD s/p DES, hyperlipidemia, CKD, restrictive lung disease,HTN, stroke, liver cirrhosis, AICD, GERD, melanoma, pancreatitis, sleep apnea, VF and chronic heart failure.   Was in the ED 10/27/22 due to nausea epigastric discomfort some shortness of breathAdmitted 10/30/22 due to DOE and orthopnea x 7 days. Chest xray showed cardiomegaly and central vascular congestion. Cardiology & nephrology consults obtained. Given IV lasix. Cr worsened with diuretics so discontinued and put on gentle fluids in setting of HF. Placed on oxygen. Discharged after 3 days.   Admitted 02/25/23 due to worsening SOB due to acute/ chronic HF. Placed on oxygen. Chest x-ray with enlargement of cardiac silhouette and mild pulmonary edema. CT of the chest abdomen pelvis with moderate to severe ascites with anasarca. Paracentesis yielded 6L fluids. Jardiance held due to AKI.    Admitted 04/03/23 due to worsening lower extremity swelling, abdominal distention, shortness of breath and productive cough for last few days. Had not been taking diuretic per cardiology. Needed 4L supplemental oxygen. Chest x-ray shows cardiomegaly with mild to moderate pulmonary edema and bilateral pleural effusion. CT abdomen shows moderate ascites with anasarca, nodular liver and severe atrophy of left kidney. IV diuresed. Underwent ultrasound-guided paracentesis 6.6 L of clear fluid drained. Patient was continued on IV Lasix. Admitted 04/23/23 due to weight gain, shortness of breath, and falling at [facility]. IV diuresed. S/p paracentesis with removal of 6.8 L of amber-colored fluid. Placed back on oral diuretics.   Echo 11/13/19: EF 20-25% with mild LAE/RAE and moderate Taylor/TR Echo 08/10/20: EF  35-40% along with mild Taylor and borderline dilatation of aortic root at 39 mm Echo 10/30/22: EF of <20% along with mild LVH, severe LAE, severe Taylor, mild AR and moderate/severe TR.   Stress test 05/07/21: Moderate to severe LV systolic dysfunction  Large perfusion abnormality of severe intensity in the anterior apical  myocardial perfusion distribution consistent with previous infarct and/or  scar without evidence of myocardial ischemia   RHC/LHC 10/02/12: Left Main:  normal       LAD system:  significant       LCX system:  significant       RCA system:  normal       Number of vessels with significant CAD:  2 - Vessel   Left Ventriculogram       Ejection Fraction:   65%   He presents today for a HF follow-up visit with a chief complaint of moderate SOB with minimal exertion. Chronic in nature and no better from last visit here a few days ago. He has associated fatigue, intermittent dizziness, abdominal distention and chronic difficulty sleeping. Denies chest pain, cough or palpitations. Since last visit, he used 1 dose of furoscix with potassium but doesn't feel any better. He says that his abdomen feels more tight but his breathing isn't any worse.   He's had to have previous paracentesis done with the most recent being 04/24/23 with removal of 6.8L  ROS: All systems negative except as listed in HPI, PMH and Problem List.  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  Independent at baseline.   Social Determinants of Health   Financial Resource Strain: Low Risk  (05/12/2023)   Overall Financial Resource Strain (CARDIA)    Difficulty of Paying Living Expenses: Not hard at all  Food Insecurity: No Food Insecurity (05/12/2023)   Hunger Vital Sign    Worried About Running Out of Food in the Last Year: Never true    Ran Out of Food in the Last Year: Never true  Transportation Needs: No Transportation Needs (05/12/2023)   PRAPARE - Administrator, Civil Service (Medical): No    Lack of Transportation (Non-Medical): No  Physical Activity: Insufficiently Active (05/12/2023)   Exercise Vital Sign    Days of Exercise per Week: 4 days    Minutes of Exercise per Session: 30 min  Stress: Stress Concern Present (05/12/2023)   [employer] of Occupational Health - Occupational Stress Questionnaire    Feeling of Stress : To some extent  Social Connections: Moderately Isolated (05/12/2023)   Social Connection and Isolation Panel [NHANES]    Frequency of Communication with Friends and Family: Once a week    Frequency of Social Gatherings with Friends and Family: Once a week    Attends Religious Services: More than 4 times per year    Active Member of [employer] or Organizations: No    Attends Banker Meetings: Never    Marital Status: Married  Catering manager Violence: Not At Risk (05/12/2023)   Humiliation, Afraid, Rape, and Kick questionnaire    Fear of Current or Ex-Partner: No    Emotionally Abused: No    Physically Abused: No    Sexually Abused: No    FH:  Family History  Problem Relation Age of Onset   Cancer Mother        Melanoma skin cancer   Heart attack Father 50   Cancer Father        throat cancer    Arthritis Brother     Past Medical History:  Diagnosis Date   AAA (abdominal aortic aneurysm) (HCC) 06/03/2007   [facility]; Dr. Hart [place]   AICD (automatic cardioverter/defibrillator) present    Arrhythmia    atrial fibrillation   Barrett's esophagus    Bladder cancer (HCC)    Bradycardia    CAD (coronary artery disease)    CAP (community acquired pneumonia) 11/13/2019   CHF (congestive heart failure) (HCC)    Cluster headache    COVID-19 09/27/2021   DDD (degenerative disc disease), lumbar    Diabetes mellitus without complication (HCC)    Dyspnea    WITH EXERTION   Edema    LEFT ANKLE   Fracture of skull base (HCC) 1997   due to fall   GERD (gastroesophageal reflux disease)  Gout    History of bladder cancer 12/1995   Hyperlipidemia    Hypertension    Hypocalcemia 04/19/2020   Possibly secondary to diuretics.   Low=5.9 04/19/2020   Malignant melanoma (HCC) 12/2012   right dorsal forearm excised   Myocardial infarction [facility])    LAST 2014   Osteoarthritis of knee    Other specified complication of vascular prosthetic devices, implants and grafts, initial encounter (HCC) 08/22/2021   Pacemaker 10/10/2006   Pancreatitis 11/22/2019   Pneumonia    2016   Pre-diabetes    Psoriasis    Rib fracture 1997   due to fall   Sleep apnea    CPAP   Stroke (HCC)    Venous incompetence     Current Outpatient Medications  Medication Sig Dispense Refill   acetaminophen (TYLENOL) 500 MG tablet Take 500 mg by mouth every 6 (six) hours as needed for mild pain.     albuterol (VENTOLIN HFA) 108 (90 Base) MCG/ACT inhaler Inhale 2 puffs into the lungs every 6 (six) hours as needed for wheezing or shortness of breath. 8 g 2   allopurinol (ZYLOPRIM) 300 MG tablet Take 150 mg by mouth daily.     apixaban (ELIQUIS) 2.5 MG TABS tablet Take 1 tablet (2.5 mg total) by mouth 2 (two) times daily. 60 tablet 1   aspirin 81 MG EC tablet Take 81 mg by mouth daily.       atorvastatin (LIPITOR) 40 MG tablet TAKE 1 TABLET(40 MG) BY MOUTH DAILY 90 tablet 1   benzonatate (TESSALON) 200 MG capsule Take 1 capsule (200 mg total) by mouth 3 (three) times daily as needed for cough. 20 capsule 0   carvedilol (COREG) 6.25 MG tablet Take 1 tablet (6.25 mg total) by mouth 2 (two) times daily with a meal. 60 tablet 0   dextromethorphan-guaiFENesin (MUCINEX DM) 30-600 MG 12hr tablet Take 1 tablet by mouth 2 (two) times daily as needed for cough. 30 tablet 0   Furosemide (FUROSCIX) 80 MG/10ML CTKT Inject 80 mg into the skin as directed.     ipratropium-albuterol (DUONEB) 0.5-2.5 (3) MG/3ML SOLN Inhale 3 mLs into the lungs every 4 (four) hours as needed (wheezing, shob). 360 mL 1   isosorbide mononitrate (IMDUR) 30 MG 24 hr tablet TAKE 1/2 TABLET(15 MG) BY MOUTH DAILY (Patient taking differently: Take 15 mg by mouth daily.) 45 tablet 2   JARDIANCE 10 MG TABS tablet Take 10 mg by mouth daily.     Magnesium 200 MG TABS Take 1 tablet (200 mg total) by mouth in the morning and at bedtime. 60 tablet 5   pantoprazole (PROTONIX) 40 MG tablet TAKE 1 TABLET BY MOUTH EVERY DAY 90 tablet 4   potassium chloride SA (KLOR-CON M) 20 MEQ tablet Take 1 tablet (20 mEq total) by mouth daily. 90 tablet 3   predniSONE (DELTASONE) 10 MG tablet Take 4 tablets (40 mg total) by mouth daily with breakfast for 2 days, THEN 2 tablets (20 mg total) daily with breakfast for 2 days, THEN 1 tablet (10 mg total) daily with breakfast for 2 days, THEN 1 tablet (10 mg total) daily with breakfast for 2 days, THEN 0.5 tablets (5 mg total) daily with breakfast for 2 days. 17 tablet 0   spironolactone (ALDACTONE) 25 MG tablet Take 1 tablet (25 mg total) by mouth daily. 30 tablet 1   torsemide (DEMADEX) 20 MG tablet Take 3 tablets (60 mg total) by mouth 2 (two) times daily. 180 tablet 5  triamcinolone ointment (KENALOG) 0.1 % APPLY TOPICALLY TWICE DAILY AS DIRECTED 454 g 3   No current facility-administered medications for  this visit.   Vitals:   07/25/23 1536  BP: 112/69  Pulse: 74  SpO2: 100%  Weight: 177 lb (80.3 kg)   Wt Readings from Last 3 Encounters:  07/25/23 177 lb (80.3 kg)  07/21/23 178 lb (80.7 kg)  07/16/23 170 lb 12.8 oz (77.5 kg)   Lab Results  Component Value Date   CREATININE 2.62 (H) 05/05/2023   CREATININE 2.77 (H) 04/29/2023   CREATININE 2.86 (H) 04/28/2023   PHYSICAL EXAM:  General:  Well appearing. No resp difficulty HEENT: normal Neck: supple. JVP elevated. No lymphadenopathy or thryomegaly appreciated. Cor: PMI normal. Regular rate & rhythm. No rubs, gallops or murmurs. Lungs: clear Abdomen: soft, nontender, distended. No hepatosplenomegaly. No bruits or masses. Good bowel sounds. Extremities: no cyanosis, clubbing, rash, 2+ pitting edema bilateral lower legs Neuro: alert & orientedx3, cranial nerves grossly intact. Moves all 4 extremities w/o difficulty. Affect pleasant.   ECG: not done  ReDs: 54%   ASSESSMENT & PLAN:  1: Ischemic heart failure with reduced ejection fraction- - NYHA class III - fluid overloaded with continued symptoms and elevated ReDs reading - weighing daily; weight chart reviewed and shows weight gain of 12 pounds the month of August; reminded to call for an overnight weight gain of > 2 pounds or a weekly weight gain of > 5 pounds - weight down 1 pound from last visit here 4 days ago - Echo 11/13/19: EF 20-25% with mild LAE/RAE and moderate Taylor/TR - Echo 08/10/20: EF 35-40% along with mild Taylor and borderline dilatation of aortic root at 39 mm - Echo 10/30/22: EF of <20% along with mild LVH, severe LAE, severe Taylor, mild AR and moderate/severe TR.  - Stress test 05/07/21 showed moderate to severe LV systolic dysfunction, large perfusion abnormality of severe intensity in the anterior apical myocardial perfusion distribution consistent with previous infarct and/or scar without evidence of myocardial ischemia  - Reds 54%; 4 days ago it was 48% - will  send for 80mg  IV lasix/ PO potassium today - scheduled for abd u/s with paracentesis on 07/29/23 (has a history of portal HTN) - used furoscix once but didn't feel like he got any results from it - not adding salt to his food - continue carvedilol 6.25mg  BID - continue torsemide 60mg  BID - continue jardiance 10mg  daily - renal function does not currently allow for entresto - saw [facility] Allena Katz) 07/24 - has AICD - Mg 04/05/23 was 2.3 - BNP 04/23/23 was 1516.7 - BNP today  2: HTN- - BP 112/69 - saw PCP Sherrie Mustache) 08/24 - BMP 07/01/23 reviewed and showed sodium 139, potassium 4.0, creatinine 2.65 and GFR 24 - BMP today - saw nephrology Suezanne Jacquet) 08/24  3: Atrial fibrillation- - saw cardiology (Paraschos) 06/24 - continue apixaban 2.5mg  BID - has pacemaker with OptiVol capacity for fluid monitoring - battery change out done 02/13/23  4: CAD- - CABG 2007 - continue atorvastatin 40mg  daily - RHC/LHC done 10/02/12:      Left Main:  normal       LAD system:  significant       LCX system:  significant       RCA system:  normal       Left Ventriculogram Ejection Fraction:   65%   5: DM with CKD-  - A1c 05/05/23 was 7.1%  6: Sleep apnea- - wearing CPAP  nightly - saw pulmonology Karna Christmas) 03/24 - wearing oxygen at 2L with his CPAP  Return next week. Should symptoms worsen, he needs to go to the ED

## 2023-07-29 ENCOUNTER — Ambulatory Visit
Admission: RE | Admit: 2023-07-29 | Discharge: 2023-07-29 | Disposition: A | Discharge status: Home / Self Care | Payer: Medicare Other | Source: Ambulatory Visit | Attending: Family | Admitting: Family

## 2023-07-29 DIAGNOSIS — I509 Heart failure, unspecified: Secondary | ICD-10-CM | POA: Diagnosis not present

## 2023-07-29 DIAGNOSIS — R140 Abdominal distension (gaseous): Secondary | ICD-10-CM

## 2023-07-29 DIAGNOSIS — R188 Other ascites: Secondary | ICD-10-CM | POA: Diagnosis not present

## 2023-07-29 MED ORDER — LIDOCAINE HCL (PF) 1 % IJ SOLN
10.0000 mL | Freq: Once | INTRAMUSCULAR | Status: AC
  Administered 2023-07-29: 10 mL via INTRADERMAL
  Filled 2023-07-29: qty 10

## 2023-07-29 NOTE — Procedures (Signed)
Ultrasound-guided therapeutic paracentesis performed yielding 4.8 liters of straw colored fluid.  No immediate complications. EBL is none.

## 2023-07-31 ENCOUNTER — Ambulatory Visit (HOSPITAL_BASED_OUTPATIENT_CLINIC_OR_DEPARTMENT_OTHER): Payer: Medicare Other | Admitting: Family

## 2023-07-31 ENCOUNTER — Other Ambulatory Visit
Admission: RE | Admit: 2023-07-31 | Discharge: 2023-07-31 | Disposition: A | Discharge status: Home / Self Care | Payer: Medicare Other | Source: Ambulatory Visit | Attending: Family | Admitting: Family

## 2023-07-31 ENCOUNTER — Encounter: Payer: Self-pay | Admitting: Family

## 2023-07-31 ENCOUNTER — Telehealth: Payer: Self-pay

## 2023-07-31 VITALS — BP 106/77 | HR 80 | Wt 167.0 lb

## 2023-07-31 DIAGNOSIS — I10 Essential (primary) hypertension: Secondary | ICD-10-CM | POA: Insufficient documentation

## 2023-07-31 DIAGNOSIS — G4733 Obstructive sleep apnea (adult) (pediatric): Secondary | ICD-10-CM | POA: Insufficient documentation

## 2023-07-31 DIAGNOSIS — I5022 Chronic systolic (congestive) heart failure: Secondary | ICD-10-CM | POA: Diagnosis not present

## 2023-07-31 DIAGNOSIS — I2510 Atherosclerotic heart disease of native coronary artery without angina pectoris: Secondary | ICD-10-CM | POA: Insufficient documentation

## 2023-07-31 DIAGNOSIS — E1122 Type 2 diabetes mellitus with diabetic chronic kidney disease: Secondary | ICD-10-CM | POA: Diagnosis not present

## 2023-07-31 DIAGNOSIS — I480 Paroxysmal atrial fibrillation: Secondary | ICD-10-CM | POA: Diagnosis not present

## 2023-07-31 DIAGNOSIS — Z9861 Coronary angioplasty status: Secondary | ICD-10-CM | POA: Diagnosis not present

## 2023-07-31 DIAGNOSIS — N184 Chronic kidney disease, stage 4 (severe): Secondary | ICD-10-CM | POA: Diagnosis not present

## 2023-07-31 LAB — BASIC METABOLIC PANEL
Anion gap: 14 (ref 5–15)
BUN: 83 mg/dL — ABNORMAL HIGH (ref 8–23)
CO2: 26 mmol/L (ref 22–32)
Calcium: 9.1 mg/dL (ref 8.9–10.3)
Chloride: 99 mmol/L (ref 98–111)
Creatinine, Ser: 3.33 mg/dL — ABNORMAL HIGH (ref 0.61–1.24)
GFR, Estimated: 18 mL/min — ABNORMAL LOW (ref >60)
Glucose, Bld: 121 mg/dL — ABNORMAL HIGH (ref 70–99)
Potassium: 3.5 mmol/L (ref 3.5–5.1)
Sodium: 139 mmol/L (ref 135–145)

## 2023-07-31 LAB — BRAIN NATRIURETIC PEPTIDE: B Natriuretic Peptide: 901.7 pg/mL — ABNORMAL HIGH (ref 0.0–100.0)

## 2023-07-31 NOTE — Telephone Encounter (Addendum)
Spoke with pt regarding Clarisa Kindred ,FNP recommendations: Hold Torsemide today and tomorrow.  BMET 08/07/23 Pt aware, agreeable, and verbalized understanding   Chrystine Oiler, CMA 07/31/2023 12:00 PM EDT Back to Top    Lvm.   Delma Freeze, FNP 07/31/2023 11:23 AM EDT     Kidney function has worsened since IV lasix last week. Hold afternoon dose of torsemide today and tomorrow. Repeat BMET early next week.

## 2023-07-31 NOTE — Progress Notes (Signed)
PCP: Mila Merry, MD (last seen 08/24) Primary Cardiologist: Marcina Millard, MD (last seen 06/24) ADHF provider: Florestine Avers, MD (last seen 07/24)   HPI:  Taylor [name] is a 82 y/o male with a history of AAA, bladder cancer, DM, atrial fibrillation, CAD s/p DES, hyperlipidemia, CKD, restrictive lung disease,HTN, stroke, liver cirrhosis, AICD, GERD, melanoma, pancreatitis, sleep apnea, VF and chronic heart failure.   Was in the ED 10/27/22 due to nausea epigastric discomfort some shortness of breath. Admitted 10/30/22 due to DOE and orthopnea x 7 days. Chest xray showed cardiomegaly and central vascular congestion. Cardiology & nephrology consults obtained. Given IV lasix. Cr worsened with diuretics so discontinued and put on gentle fluids in setting of HF. Placed on oxygen. Discharged after 3 days.   Admitted 02/25/23 due to worsening SOB due to acute/ chronic HF. Placed on oxygen. Chest x-ray with enlargement of cardiac silhouette and mild pulmonary edema. CT of the chest abdomen pelvis with moderate to severe ascites with anasarca. Paracentesis yielded 6L fluids. Jardiance held due to AKI.    Admitted 04/03/23 due to worsening lower extremity swelling, abdominal distention, shortness of breath and productive cough for last few days. Had not been taking diuretic per cardiology. Needed 4L supplemental oxygen. Chest x-ray shows cardiomegaly with mild to moderate pulmonary edema and bilateral pleural effusion. CT abdomen shows moderate ascites with anasarca, nodular liver and severe atrophy of left kidney. IV diuresed. Underwent ultrasound-guided paracentesis 6.6 L of clear fluid drained. Patient was continued on IV Lasix. Admitted 04/23/23 due to weight gain, shortness of breath, and falling at [employer]. IV diuresed. S/p paracentesis with removal of 6.8 L of amber-colored fluid. Placed back on oral diuretics.   Echo 11/13/19: EF 20-25% with mild LAE/RAE and moderate Taylor/TR Echo 08/10/20: EF  35-40% along with mild Taylor and borderline dilatation of aortic root at 39 mm Echo 10/30/22: EF of <20% along with mild LVH, severe LAE, severe Taylor, mild AR and moderate/severe TR.   Stress test 05/07/21: Moderate to severe LV systolic dysfunction  Large perfusion abnormality of severe intensity in the anterior apical  myocardial perfusion distribution consistent with previous infarct and/or  scar without evidence of myocardial ischemia   RHC/LHC 10/02/12: Left Main:  normal       LAD system:  significant       LCX system:  significant       RCA system:  normal       Number of vessels with significant CAD:  2 - Vessel   Left Ventriculogram       Ejection Fraction:   65%   He presents today for a HF follow-up visit with a chief complaint of moderate shortness of breath with minimal exertion. Chronic in nature. Has associated fatigue, dizziness, abdominal distention (better) and pedal edema along with this. Denies chest pain, cough, palpitations, weight gain or difficulty sleeping.   Received 80mg  IV lasix/ PO potassium 6 days ago but says that he really doesn't think it helped. Started on potassium daily.   Had paracentesis done 07/29/23 with removal of 4.8L. Previous paracentesis done 04/24/23 with removal of 6.8L  He is not taking spironolactone and pharmacy says that it hasn't been filled since 7/19. (Today is 07/31/23) He says that he's only taking potassium daily instead of .  ROS: All systems negative except as listed in HPI, PMH and Problem List.  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  Independent at baseline.   Social Determinants of Health   Financial Resource Strain: Low Risk  (05/12/2023)   Overall Financial Resource Strain (CARDIA)    Difficulty of Paying Living Expenses: Not hard at all  Food Insecurity: No Food Insecurity (05/12/2023)   Hunger Vital Sign    Worried About Running Out of Food in the Last Year: Never true    Ran Out of Food in the Last Year: Never true  Transportation Needs: No Transportation Needs (05/12/2023)   PRAPARE - Administrator, Civil Service (Medical): No    Lack of Transportation (Non-Medical): No  Physical Activity: Insufficiently Active (05/12/2023)   Exercise Vital Sign    Days of Exercise per Week: 4 days    Minutes of Exercise per Session: 30 min  Stress: Stress Concern Present (05/12/2023)   [employer] of Occupational Health - Occupational Stress Questionnaire    Feeling of Stress : To some extent  Social Connections: Moderately Isolated (05/12/2023)   Social Connection and Isolation Panel [NHANES]    Frequency of Communication with Friends and Family: Once a week    Frequency of Social Gatherings with Friends and Family: Once a week    Attends Religious Services: More than 4 times per year    Active Member of [employer] or Organizations: No    Attends Banker Meetings: Never    Marital Status: Married  Catering manager Violence: Not At Risk (05/12/2023)   Humiliation, Afraid, Rape, and Kick questionnaire    Fear of Current or Ex-Partner: No    Emotionally Abused: No    Physically Abused: No    Sexually Abused: No    FH:  Family History   Problem Relation Age of Onset   Cancer Mother        Melanoma skin cancer   Heart attack Father 22   Cancer Father        throat cancer   Arthritis Brother     Past Medical History:  Diagnosis Date   AAA (abdominal aortic aneurysm) (HCC) 06/03/2007   [facility]; Dr. Hart [place]   AICD (automatic cardioverter/defibrillator) present    Arrhythmia    atrial fibrillation   Barrett's esophagus    Bladder cancer (HCC)    Bradycardia    CAD (coronary artery disease)    CAP (community acquired pneumonia) 11/13/2019   CHF (congestive heart failure) (HCC)    Cluster headache    COVID-19 09/27/2021   DDD (degenerative disc disease), lumbar    Diabetes mellitus without complication (HCC)    Dyspnea    WITH EXERTION   Edema  LEFT ANKLE   Fracture of skull base (HCC) 1997   due to fall   GERD (gastroesophageal reflux disease)    Gout    History of bladder cancer 12/1995   Hyperlipidemia    Hypertension    Hypocalcemia 04/19/2020   Possibly secondary to diuretics.   Low=5.9 04/19/2020   Malignant melanoma (HCC) 12/2012   right dorsal forearm excised   Myocardial infarction [facility])    LAST 2014   Osteoarthritis of knee    Other specified complication of vascular prosthetic devices, implants and grafts, initial encounter (HCC) 08/22/2021   Pacemaker 10/10/2006   Pancreatitis 11/22/2019   Pneumonia    2016   Pre-diabetes    Psoriasis    Rib fracture 1997   due to fall   Sleep apnea    CPAP   Stroke (HCC)    Venous incompetence     Current Outpatient Medications  Medication Sig Dispense Refill   acetaminophen (TYLENOL) 500 MG tablet Take 500 mg by mouth every 6 (six) hours as needed for mild pain.     albuterol (VENTOLIN HFA) 108 (90 Base) MCG/ACT inhaler Inhale 2 puffs into the lungs every 6 (six) hours as needed for wheezing or shortness of breath. 8 g 2   allopurinol (ZYLOPRIM) 300 MG tablet Take 150 mg by mouth daily.     apixaban (ELIQUIS) 2.5 MG TABS  tablet Take 1 tablet (2.5 mg total) by mouth 2 (two) times daily. 60 tablet 1   aspirin 81 MG EC tablet Take 81 mg by mouth daily.      atorvastatin (LIPITOR) 40 MG tablet TAKE 1 TABLET(40 MG) BY MOUTH DAILY 90 tablet 1   benzonatate (TESSALON) 200 MG capsule Take 1 capsule (200 mg total) by mouth 3 (three) times daily as needed for cough. (Patient not taking: Reported on 07/25/2023) 20 capsule 0   carvedilol (COREG) 6.25 MG tablet Take 1 tablet (6.25 mg total) by mouth 2 (two) times daily with a meal. 60 tablet 0   dextromethorphan-guaiFENesin (MUCINEX DM) 30-600 MG 12hr tablet Take 1 tablet by mouth 2 (two) times daily as needed for cough. (Patient not taking: Reported on 07/25/2023) 30 tablet 0   Furosemide (FUROSCIX) 80 MG/10ML CTKT Inject 80 mg into the skin as directed. (Patient not taking: Reported on 07/25/2023)     ipratropium-albuterol (DUONEB) 0.5-2.5 (3) MG/3ML SOLN Inhale 3 mLs into the lungs every 4 (four) hours as needed (wheezing, shob). 360 mL 1   isosorbide mononitrate (IMDUR) 30 MG 24 hr tablet TAKE 1/2 TABLET(15 MG) BY MOUTH DAILY (Patient taking differently: Take 15 mg by mouth daily.) 45 tablet 2   JARDIANCE 10 MG TABS tablet Take 10 mg by mouth daily.     Magnesium 200 MG TABS Take 1 tablet (200 mg total) by mouth in the morning and at bedtime. 60 tablet 5   pantoprazole (PROTONIX) 40 MG tablet TAKE 1 TABLET BY MOUTH EVERY DAY (Patient not taking: Reported on 07/25/2023) 90 tablet 4   potassium chloride SA (KLOR-CON M) 20 MEQ tablet Take 2 tablets (40 mEq total) by mouth daily.     spironolactone (ALDACTONE) 25 MG tablet Take 1 tablet (25 mg total) by mouth daily. (Patient not taking: Reported on 07/25/2023) 30 tablet 1   torsemide (DEMADEX) 20 MG tablet Take 3 tablets (60 mg total) by mouth 2 (two) times daily. 180 tablet 5   triamcinolone ointment (KENALOG) 0.1 % APPLY TOPICALLY TWICE DAILY AS DIRECTED 454 g 3   No  current facility-administered medications for this visit.    Vitals:   07/31/23 0927  BP: 106/77  Pulse: 80  SpO2: 100%  Weight: 167 lb (75.8 kg)   Wt Readings from Last 3 Encounters:  07/31/23 167 lb (75.8 kg)  07/25/23 177 lb (80.3 kg)  07/21/23 178 lb (80.7 kg)   Lab Results  Component Value Date   CREATININE 1.91 (H) 07/25/2023   CREATININE 2.62 (H) 05/05/2023   CREATININE 2.77 (H) 04/29/2023   PHYSICAL EXAM:  General:  Well appearing. No resp difficulty HEENT: normal Neck: supple. JVP elevated. No lymphadenopathy or thryomegaly appreciated. Cor: PMI normal. Regular rate & rhythm. No rubs, gallops or murmurs. Lungs: clear Abdomen: soft, nontender, distended but softer. No hepatosplenomegaly. No bruits or masses. Good bowel sounds. Extremities: no cyanosis, clubbing, rash, 1+ pitting edema bilateral lower legs Neuro: alert & orientedx3, cranial nerves grossly intact. Moves all 4 extremities w/o difficulty. Affect pleasant.   ECG: not done  ReDs: 47%   ASSESSMENT & PLAN:  1: Ischemic heart failure with reduced ejection fraction- - NYHA class III - fluid overloaded with continued symptoms and elevated ReDs reading, although symptoms and ReDs improving - weighing daily; weight chart reviewed and shows weight loss of 11 pounds in last 4 days; reminded to call for an overnight weight gain of > 2 pounds or a weekly weight gain of > 5 pounds - weight unchanged from last visit here 6 days ago - Echo 11/13/19: EF 20-25% with mild LAE/RAE and moderate Taylor/TR - Echo 08/10/20: EF 35-40% along with mild Taylor and borderline dilatation of aortic root at 39 mm - Echo 10/30/22: EF of <20% along with mild LVH, severe LAE, severe Taylor, mild AR and moderate/severe TR.  - Stress test 05/07/21 showed moderate to severe LV systolic dysfunction, large perfusion abnormality of severe intensity in the anterior apical myocardial perfusion distribution consistent with previous infarct and/or scar without evidence of myocardial ischemia  - Reds 47%; 6 days ago  it was 54% - received 80mg  IV lasix/ PO potassium 6 days ago without much relief - had paracentesis on 07/29/23 with removal of 4.8L (has a history of portal HTN) - previous paracentesis done 04/24/23 with removal of 6.8L - not adding salt to his food - continue carvedilol 6.25mg  BID - continue torsemide 60mg  BID/ potassium daily - continue jardiance 10mg  daily - will check BMP/ BNP today and hopefully add spironolactone back - consider entresto if BP allows - saw [facility] Allena Katz) 07/24 - has AICD - Mg 04/05/23 was 2.3 - resume wearing compression socks daily with removal at bedtime - BNP 07/25/23 was 746.2 - BNP today  2: HTN- - BP 112/69 @ home today it was 121/82 - saw PCP Sherrie Mustache) 08/24 - BMP 07/25/23 reviewed and showed sodium 138, potassium 3.0, creatinine 1.91 and GFR 35 - BMP today - saw nephrology Suezanne Jacquet) 08/24  3: Atrial fibrillation- - saw cardiology (Paraschos) 06/24 - continue apixaban 2.5mg  BID - has pacemaker with OptiVol capacity for fluid monitoring - battery change out done 02/13/23  4: CAD- - CABG 2007 - continue atorvastatin 40mg  daily - RHC/LHC done 10/02/12:      Left Main:  normal       LAD system:  significant       LCX system:  significant       RCA system:  normal       Left Ventriculogram Ejection Fraction:   65%   5: DM with CKD-  -  A1c 05/05/23 was 7.1%  6: Sleep apnea- - wearing CPAP nightly - saw pulmonology Karna Christmas) 03/24 - wearing oxygen at 2L with his CPAP  Return in 1 month, sooner if needed.

## 2023-07-31 NOTE — Patient Instructions (Addendum)
Please bring your medication bottles to every visit.    Start wearing your compression socks daily with removal at bedtime.    Check with your heart doctor about when your next appointment is.

## 2023-08-01 ENCOUNTER — Encounter: Payer: Medicare Other | Admitting: Family

## 2023-08-04 NOTE — Group Note (Deleted)

## 2023-08-07 ENCOUNTER — Other Ambulatory Visit
Admission: RE | Admit: 2023-08-07 | Discharge: 2023-08-07 | Disposition: A | Discharge status: Home / Self Care | Payer: Medicare Other | Source: Ambulatory Visit | Attending: Family | Admitting: Family

## 2023-08-07 DIAGNOSIS — I5022 Chronic systolic (congestive) heart failure: Secondary | ICD-10-CM | POA: Insufficient documentation

## 2023-08-07 LAB — BASIC METABOLIC PANEL
Anion gap: 11 (ref 5–15)
BUN: 54 mg/dL — ABNORMAL HIGH (ref 8–23)
CO2: 28 mmol/L (ref 22–32)
Calcium: 8.8 mg/dL — ABNORMAL LOW (ref 8.9–10.3)
Chloride: 100 mmol/L (ref 98–111)
Creatinine, Ser: 2.77 mg/dL — ABNORMAL HIGH (ref 0.61–1.24)
GFR, Estimated: 22 mL/min — ABNORMAL LOW (ref >60)
Glucose, Bld: 99 mg/dL (ref 70–99)
Potassium: 3.4 mmol/L — ABNORMAL LOW (ref 3.5–5.1)
Sodium: 139 mmol/L (ref 135–145)

## 2023-08-08 ENCOUNTER — Telehealth: Payer: Self-pay

## 2023-08-08 NOTE — Telephone Encounter (Addendum)
Spoke with pt regarding Clarisa Kindred, FNP recommendations Lab results Increase potassium 40 mEq (2 tablets) daily. Labs on next appt.    ----- Message from Delma Freeze sent at 08/07/2023  3:20 PM EDT ----- Kidney function improving. Potassium is a little low so increase your potassium to every day. Will recheck labs at your next appointment.

## 2023-08-12 DIAGNOSIS — I495 Sick sinus syndrome: Secondary | ICD-10-CM | POA: Diagnosis not present

## 2023-08-15 ENCOUNTER — Ambulatory Visit: Payer: Medicare Other | Admitting: Family Medicine

## 2023-08-15 ENCOUNTER — Encounter: Payer: Self-pay | Admitting: Family Medicine

## 2023-08-15 VITALS — BP 116/85 | HR 76 | Temp 97.6°F | Wt 176.7 lb

## 2023-08-15 DIAGNOSIS — E1122 Type 2 diabetes mellitus with diabetic chronic kidney disease: Secondary | ICD-10-CM

## 2023-08-15 DIAGNOSIS — R188 Other ascites: Secondary | ICD-10-CM | POA: Diagnosis not present

## 2023-08-15 DIAGNOSIS — I2510 Atherosclerotic heart disease of native coronary artery without angina pectoris: Secondary | ICD-10-CM | POA: Diagnosis not present

## 2023-08-15 DIAGNOSIS — Z23 Encounter for immunization: Secondary | ICD-10-CM

## 2023-08-15 DIAGNOSIS — I5020 Unspecified systolic (congestive) heart failure: Secondary | ICD-10-CM

## 2023-08-15 DIAGNOSIS — N184 Chronic kidney disease, stage 4 (severe): Secondary | ICD-10-CM

## 2023-08-15 DIAGNOSIS — Z9861 Coronary angioplasty status: Secondary | ICD-10-CM

## 2023-08-15 DIAGNOSIS — Z7984 Long term (current) use of oral hypoglycemic drugs: Secondary | ICD-10-CM | POA: Diagnosis not present

## 2023-08-15 LAB — POCT GLYCOSYLATED HEMOGLOBIN (HGB A1C)
Est. average glucose Bld gHb Est-mCnc: 143
Hemoglobin A1C: 6.6 % — AB (ref 4.0–5.6)

## 2023-08-15 MED ORDER — POTASSIUM CHLORIDE CRYS ER 20 MEQ PO TBCR: 40.0000 meq | EXTENDED_RELEASE_TABLET | Freq: Every day | ORAL | 1 refills | Status: DC

## 2023-08-15 NOTE — Progress Notes (Signed)
Established patient visit   Patient: Taylor [name]   DOB: January 22, 1941   82 y.o. Male  MRN: [id number] Visit Date: 08/15/2023  Today's healthcare provider: Mila Merry, MD   Chief Complaint  Patient presents with   Follow-up    3 months   Diabetes   Subjective    Discussed the use of AI scribe software for clinical note transcription with the patient, who gave verbal consent to proceed.  History of Present Illness   The patient, with a history of heart failure and diabetes, presents with ongoing fatigue and difficulty breathing. He describes his breathing as not being in rhythm, suggesting possible dyspnea. He also reports tightness in the legs, but denies overt swelling. The patient has recently seen at CHF clinic and potassium supplement was doubled. He also had paracentesis a few weeks ago.  He expresses concern that the fluid may be accumulating again. He has been prescribed spironolactone 25mg , but today he is unsure if he is taking it, and the pharmacy dispense history shows last had 30 day supply dispensed in July.       Medications: Outpatient Medications Prior to Visit  Medication Sig   acetaminophen (TYLENOL) 500 MG tablet Take 500 mg by mouth every 6 (six) hours as needed for mild pain.   albuterol (VENTOLIN HFA) 108 (90 Base) MCG/ACT inhaler Inhale 2 puffs into the lungs every 6 (six) hours as needed for wheezing or shortness of breath.   apixaban (ELIQUIS) 2.5 MG TABS tablet Take 1 tablet (2.5 mg total) by mouth 2 (two) times daily.   aspirin 81 MG EC tablet Take 81 mg by mouth daily.    atorvastatin (LIPITOR) 40 MG tablet TAKE 1 TABLET(40 MG) BY MOUTH DAILY   carvedilol (COREG) 6.25 MG tablet Take 1 tablet (6.25 mg total) by mouth 2 (two) times daily with a meal.   isosorbide mononitrate (IMDUR) 30 MG 24 hr tablet TAKE 1/2 TABLET(15 MG) BY MOUTH DAILY   JARDIANCE 10 MG TABS tablet Take 10 mg by mouth daily.   Magnesium 200 MG TABS Take 1 tablet (200 mg total) by  mouth in the morning and at bedtime.   pantoprazole (PROTONIX) 40 MG tablet TAKE 1 TABLET BY MOUTH EVERY DAY   potassium chloride SA (KLOR-CON M) 20 MEQ tablet Take 2 tablets (40 mEq total) by mouth daily. (Patient taking differently: Take 20 mEq by mouth daily.)   spironolactone (ALDACTONE) 25 MG tablet Take 1 tablet (25 mg total) by mouth daily.   torsemide (DEMADEX) 20 MG tablet Take 3 tablets (60 mg total) by mouth 2 (two) times daily.   triamcinolone ointment (KENALOG) 0.1 % APPLY TOPICALLY TWICE DAILY AS DIRECTED   allopurinol (ZYLOPRIM) 300 MG tablet Take 150 mg by mouth daily. (Patient not taking: Reported on 08/15/2023)   ipratropium-albuterol (DUONEB) 0.5-2.5 (3) MG/3ML SOLN Inhale 3 mLs into the lungs every 4 (four) hours as needed (wheezing, shob).   No facility-administered medications prior to visit.   Review of Systems     Objective    BP 116/85   Pulse 76   Temp 97.6 F (36.4 C)   Wt 176 lb 11.2 oz (80.2 kg)   SpO2 99%   BMI 25.35 kg/m   Physical Exam  Physical Exam   CHEST: Lungs clear to auscultation. CARDIOVASCULAR: Heart sounds normal on auscultation. Carotid arteries without bruits. EXTREMITIES: Ankles exhibit tightness.     Results for orders placed or performed in visit on 08/15/23  POCT HgB A1C  Result Value Ref Range   Hemoglobin A1C 6.6 (A) 4.0 - 5.6 %   Est. average glucose Bld gHb Est-mCnc 143     Assessment & Plan        Congestive Heart Failure Reports of fatigue and dyspnea. No chest pain or palpitations. Cardiology recently adjusted potassium supplementation. Lower extremity edema noted on exam, but patient reports no significant swelling, just tightness. Unclear if patient is currently taking Spironolactone. -Unclear if he is taking spironolactone and could find no recommendations to discontinue it by any of his specialists. He is to check his home meds to see if currently taking Spironolactone (Aldactone). If not, consider restarting at a  half dose to avoid hypotension. -Advise patient to elevate legs when not ambulating to reduce edema. -Continue current medications including Jardiance and Torsemide. -Recheck potassium levels once Spironolactone status is clarified.  Abdominal Ascites Recent paracentesis performed with temporary relief, but patient reports feeling like fluid is accumulating again. -Monitor abdominal girth and symptoms. Consider repeat paracentesis if symptoms worsen.   Diabetes Mellitus Recent improvement in blood glucose control. -Continue current regimen and monitor HbA1c in January.  General Health Maintenance -Flu vaccine administered today. -Schedule follow-up appointment in January for diabetes management.    Return in about 4 months (around 12/15/2023) for Diabetes.      Mila Merry, MD  [facility] [phone] (phone) [phone] (fax)  [facility]

## 2023-08-15 NOTE — Patient Instructions (Signed)
Please review the attached list of medications and notify my office if there are any errors.   Check to see if you are taking spironolactone.  If not, then send me message and I'll send in a new prescription to start back on 1/2 tablet a day

## 2023-08-16 ENCOUNTER — Other Ambulatory Visit: Payer: Self-pay | Admitting: Family Medicine

## 2023-08-18 ENCOUNTER — Other Ambulatory Visit: Payer: Self-pay

## 2023-08-18 ENCOUNTER — Telehealth: Payer: Self-pay | Admitting: Family Medicine

## 2023-08-18 NOTE — Telephone Encounter (Signed)
[facility] pharmacy is requesting prescription refill isosorbide mononitrate (IMDUR) 30 MG 24 hr tablet   Please advise

## 2023-08-18 NOTE — Telephone Encounter (Signed)
Requested Prescriptions  Pending Prescriptions Disp Refills   isosorbide mononitrate (IMDUR) 30 MG 24 hr tablet [Pharmacy Med Name: ISOSORBIDE MONONITRATE 30MG  ER TABS] 45 tablet 3    Sig: TAKE 1/2 TABLET(15 MG) BY MOUTH DAILY     Cardiovascular:  Nitrates Passed - 08/18/2023  3:43 PM      Passed - Last BP in normal range    BP Readings from Last 1 Encounters:  08/15/23 116/85         Passed - Last Heart Rate in normal range    Pulse Readings from Last 1 Encounters:  08/15/23 76         Passed - Valid encounter within last 12 months    Recent Outpatient Visits           3 days ago Type 2 diabetes mellitus with stage 4 chronic kidney disease, without long-term current use of insulin (HCC)   [facility] Malva Limes, MD   1 month ago Idiopathic chronic gout of left foot without tophus   [place] Simmons-Robinson, Glasco, MD   3 months ago Acute on chronic combined systolic and diastolic congestive heart failure [facility])   [facility] Malva Limes, MD   5 months ago Systolic CHF with reduced left ventricular function, NYHA class 3 (HCC)    [facility] Malva Limes, MD   5 months ago Cellulitis of toe of left foot   [facility] Malva Limes, MD       Future Appointments             In 3 months [name], Demetrios Isaacs, MD [facility]   In 8 months Deirdre Evener, MD [facility]

## 2023-08-18 NOTE — Telephone Encounter (Signed)
[facility] pharmacy is requesting prescription refill isosorbide mononitrate (IMDUR) 30 MG 24 hr tablet   Please advise   LOV:08/15/2023 LR: 11/28/2022 Qty:45 r:2

## 2023-08-19 ENCOUNTER — Ambulatory Visit: Payer: Self-pay | Admitting: *Deleted

## 2023-08-19 DIAGNOSIS — C44629 Squamous cell carcinoma of skin of left upper limb, including shoulder: Secondary | ICD-10-CM | POA: Diagnosis not present

## 2023-08-19 NOTE — Patient Outreach (Signed)
Care Coordination   Follow Up Visit Note   08/19/2023 Name: Taylor [name] MRN: [id number] DOB: 20-Jan-1941  Taylor [name] is a 82 y.o. year old male who sees [name], Demetrios Isaacs, MD for primary care. I spoke with  Taylor [name] by phone today.  What matters to the patients health and wellness today?  Patient report he is doing ok, still having shortness of breath, oxygen saturations range 89-95% on 2 liters.  State he has been trying to get CPAP supplies, but was has not received yet.  Called [facility], notified that patient will need new order for supplies.  Message sent to PCP to send new order to [facility] at [phone].  Denies any urgent concerns, encouraged to contact this care manager with questions.    Goals Addressed             This Visit's Progress    Develop Plan of care for Management of CHF   On track    Care Coordination Interventions: Basic overview and discussion of pathophysiology of Heart Failure reviewed Provided education on low sodium diet Reviewed Heart Failure Action Plan in depth and provided written copy Assessed need for readable accurate scales in home Advised patient to weigh each morning after emptying bladder Discussed importance of daily weight and advised patient to weigh and record daily Reviewed role of diuretics in prevention of fluid overload and management of heart failure;         SDOH assessments and interventions completed:  No     Care Coordination Interventions:  Yes, provided   Interventions Today    Flowsheet Row Most Recent Value  Chronic Disease   Chronic disease during today's visit Congestive Heart Failure (CHF), Chronic Obstructive Pulmonary Disease (COPD)  General Interventions   General Interventions Discussed/Reviewed General Interventions Reviewed, Communication with, Doctor Visits, Durable Medical Equipment (DME)  Doctor Visits Discussed/Reviewed Doctor Visits Reviewed, PCP, Specialist  [Cardiology 9/25, HF 10/9, Echo 10/29]   Durable Medical Equipment (DME) Oxygen, Other  [CPAP, in need of supplies for machine]  PCP/Specialist Visits Compliance with follow-up visit  Communication with PCP/Specialists  [[facility] and PCP offices called]  Exercise Interventions   Exercise Discussed/Reviewed Weight Managment  Weight Management Weight maintenance  [Weight today 165 pounds]  Education Interventions   Education Provided Provided Education  Provided Verbal Education On When to see the doctor, Medication  [BP monitoring, today 125/85, confirmed he has decreased his Spironolactone dose and adjusted his potassium dose]       Follow up plan: Follow up call scheduled for 10/22    Encounter Outcome:  Patient Visit Completed   Taylor Durie, RN, MSN, [facility] Care Management Care Management Coordinator [phone]

## 2023-08-20 DIAGNOSIS — I4901 Ventricular fibrillation: Secondary | ICD-10-CM | POA: Diagnosis not present

## 2023-08-20 DIAGNOSIS — I25810 Atherosclerosis of coronary artery bypass graft(s) without angina pectoris: Secondary | ICD-10-CM | POA: Diagnosis not present

## 2023-08-20 DIAGNOSIS — I5023 Acute on chronic systolic (congestive) heart failure: Secondary | ICD-10-CM | POA: Diagnosis not present

## 2023-08-20 DIAGNOSIS — Z9889 Other specified postprocedural states: Secondary | ICD-10-CM | POA: Diagnosis not present

## 2023-08-20 DIAGNOSIS — I7143 Infrarenal abdominal aortic aneurysm, without rupture: Secondary | ICD-10-CM | POA: Diagnosis not present

## 2023-08-20 DIAGNOSIS — E782 Mixed hyperlipidemia: Secondary | ICD-10-CM | POA: Diagnosis not present

## 2023-08-20 DIAGNOSIS — E119 Type 2 diabetes mellitus without complications: Secondary | ICD-10-CM | POA: Diagnosis not present

## 2023-08-20 DIAGNOSIS — I10 Essential (primary) hypertension: Secondary | ICD-10-CM | POA: Diagnosis not present

## 2023-08-20 DIAGNOSIS — I5022 Chronic systolic (congestive) heart failure: Secondary | ICD-10-CM | POA: Diagnosis not present

## 2023-08-20 DIAGNOSIS — G4733 Obstructive sleep apnea (adult) (pediatric): Secondary | ICD-10-CM | POA: Diagnosis not present

## 2023-08-20 DIAGNOSIS — I255 Ischemic cardiomyopathy: Secondary | ICD-10-CM | POA: Diagnosis not present

## 2023-08-20 DIAGNOSIS — Z8673 Personal history of transient ischemic attack (TIA), and cerebral infarction without residual deficits: Secondary | ICD-10-CM | POA: Diagnosis not present

## 2023-08-27 DIAGNOSIS — G4730 Sleep apnea, unspecified: Secondary | ICD-10-CM | POA: Diagnosis not present

## 2023-08-27 DIAGNOSIS — G4733 Obstructive sleep apnea (adult) (pediatric): Secondary | ICD-10-CM | POA: Diagnosis not present

## 2023-08-29 ENCOUNTER — Emergency Department (HOSPITAL_COMMUNITY): Payer: Medicare Other

## 2023-08-29 ENCOUNTER — Emergency Department (HOSPITAL_COMMUNITY)
Admission: EM | Admit: 2023-08-29 | Discharge: 2023-08-29 | Disposition: A | Discharge status: Home / Self Care | Payer: Medicare Other | Attending: Emergency Medicine | Admitting: Emergency Medicine

## 2023-08-29 DIAGNOSIS — S0240DA Maxillary fracture, left side, initial encounter for closed fracture: Secondary | ICD-10-CM | POA: Diagnosis not present

## 2023-08-29 DIAGNOSIS — S0081XA Abrasion of other part of head, initial encounter: Secondary | ICD-10-CM

## 2023-08-29 DIAGNOSIS — S52512A Displaced fracture of left radial styloid process, initial encounter for closed fracture: Secondary | ICD-10-CM | POA: Diagnosis not present

## 2023-08-29 DIAGNOSIS — S6992XA Unspecified injury of left wrist, hand and finger(s), initial encounter: Secondary | ICD-10-CM | POA: Diagnosis present

## 2023-08-29 DIAGNOSIS — M8580 Other specified disorders of bone density and structure, unspecified site: Secondary | ICD-10-CM | POA: Diagnosis not present

## 2023-08-29 DIAGNOSIS — S02401A Maxillary fracture, unspecified, initial encounter for closed fracture: Secondary | ICD-10-CM

## 2023-08-29 DIAGNOSIS — W19XXXA Unspecified fall, initial encounter: Secondary | ICD-10-CM | POA: Diagnosis not present

## 2023-08-29 DIAGNOSIS — K5730 Diverticulosis of large intestine without perforation or abscess without bleeding: Secondary | ICD-10-CM | POA: Diagnosis not present

## 2023-08-29 DIAGNOSIS — M25532 Pain in left wrist: Secondary | ICD-10-CM

## 2023-08-29 DIAGNOSIS — Z95810 Presence of automatic (implantable) cardiac defibrillator: Secondary | ICD-10-CM | POA: Diagnosis not present

## 2023-08-29 DIAGNOSIS — S52352A Displaced comminuted fracture of shaft of radius, left arm, initial encounter for closed fracture: Secondary | ICD-10-CM | POA: Diagnosis not present

## 2023-08-29 DIAGNOSIS — Z7901 Long term (current) use of anticoagulants: Secondary | ICD-10-CM | POA: Diagnosis not present

## 2023-08-29 DIAGNOSIS — N184 Chronic kidney disease, stage 4 (severe): Secondary | ICD-10-CM

## 2023-08-29 DIAGNOSIS — R918 Other nonspecific abnormal finding of lung field: Secondary | ICD-10-CM | POA: Diagnosis not present

## 2023-08-29 DIAGNOSIS — I517 Cardiomegaly: Secondary | ICD-10-CM | POA: Diagnosis not present

## 2023-08-29 DIAGNOSIS — I110 Hypertensive heart disease with heart failure: Secondary | ICD-10-CM | POA: Diagnosis not present

## 2023-08-29 DIAGNOSIS — Z7982 Long term (current) use of aspirin: Secondary | ICD-10-CM | POA: Insufficient documentation

## 2023-08-29 DIAGNOSIS — Z951 Presence of aortocoronary bypass graft: Secondary | ICD-10-CM | POA: Insufficient documentation

## 2023-08-29 DIAGNOSIS — I2510 Atherosclerotic heart disease of native coronary artery without angina pectoris: Secondary | ICD-10-CM | POA: Insufficient documentation

## 2023-08-29 DIAGNOSIS — R109 Unspecified abdominal pain: Secondary | ICD-10-CM | POA: Diagnosis not present

## 2023-08-29 DIAGNOSIS — S52515A Nondisplaced fracture of left radial styloid process, initial encounter for closed fracture: Secondary | ICD-10-CM

## 2023-08-29 DIAGNOSIS — I499 Cardiac arrhythmia, unspecified: Secondary | ICD-10-CM | POA: Diagnosis not present

## 2023-08-29 DIAGNOSIS — R079 Chest pain, unspecified: Secondary | ICD-10-CM | POA: Diagnosis not present

## 2023-08-29 DIAGNOSIS — Z23 Encounter for immunization: Secondary | ICD-10-CM | POA: Insufficient documentation

## 2023-08-29 DIAGNOSIS — R590 Localized enlarged lymph nodes: Secondary | ICD-10-CM

## 2023-08-29 DIAGNOSIS — I5023 Acute on chronic systolic (congestive) heart failure: Secondary | ICD-10-CM | POA: Insufficient documentation

## 2023-08-29 DIAGNOSIS — Z043 Encounter for examination and observation following other accident: Secondary | ICD-10-CM | POA: Diagnosis not present

## 2023-08-29 DIAGNOSIS — S022XXA Fracture of nasal bones, initial encounter for closed fracture: Secondary | ICD-10-CM | POA: Diagnosis not present

## 2023-08-29 DIAGNOSIS — S0083XA Contusion of other part of head, initial encounter: Secondary | ICD-10-CM

## 2023-08-29 DIAGNOSIS — S0990XA Unspecified injury of head, initial encounter: Secondary | ICD-10-CM | POA: Diagnosis not present

## 2023-08-29 DIAGNOSIS — Z7984 Long term (current) use of oral hypoglycemic drugs: Secondary | ICD-10-CM | POA: Insufficient documentation

## 2023-08-29 DIAGNOSIS — S01112A Laceration without foreign body of left eyelid and periocular area, initial encounter: Secondary | ICD-10-CM

## 2023-08-29 DIAGNOSIS — R609 Edema, unspecified: Secondary | ICD-10-CM | POA: Diagnosis not present

## 2023-08-29 DIAGNOSIS — M5020 Other cervical disc displacement, unspecified cervical region: Secondary | ICD-10-CM | POA: Diagnosis not present

## 2023-08-29 DIAGNOSIS — R1012 Left upper quadrant pain: Secondary | ICD-10-CM | POA: Diagnosis not present

## 2023-08-29 DIAGNOSIS — E119 Type 2 diabetes mellitus without complications: Secondary | ICD-10-CM | POA: Diagnosis not present

## 2023-08-29 DIAGNOSIS — S020XXA Fracture of vault of skull, initial encounter for closed fracture: Secondary | ICD-10-CM | POA: Diagnosis not present

## 2023-08-29 DIAGNOSIS — I10 Essential (primary) hypertension: Secondary | ICD-10-CM | POA: Diagnosis not present

## 2023-08-29 DIAGNOSIS — J90 Pleural effusion, not elsewhere classified: Secondary | ICD-10-CM

## 2023-08-29 DIAGNOSIS — S0011XA Contusion of right eyelid and periocular area, initial encounter: Secondary | ICD-10-CM | POA: Insufficient documentation

## 2023-08-29 DIAGNOSIS — Z743 Need for continuous supervision: Secondary | ICD-10-CM | POA: Diagnosis not present

## 2023-08-29 DIAGNOSIS — S299XXA Unspecified injury of thorax, initial encounter: Secondary | ICD-10-CM | POA: Diagnosis not present

## 2023-08-29 LAB — CBC WITH DIFFERENTIAL/PLATELET
Abs Immature Granulocytes: 0.03 10*3/uL (ref 0.00–0.07)
Basophils Absolute: 0.1 10*3/uL (ref 0.0–0.1)
Basophils Relative: 1 %
Eosinophils Absolute: 0.1 10*3/uL (ref 0.0–0.5)
Eosinophils Relative: 2 %
HCT: 41 % (ref 39.0–52.0)
Hemoglobin: 12.2 g/dL — ABNORMAL LOW (ref 13.0–17.0)
Immature Granulocytes: 1 %
Lymphocytes Relative: 31 %
Lymphs Abs: 1.5 10*3/uL (ref 0.7–4.0)
MCH: 30 pg (ref 26.0–34.0)
MCHC: 29.8 g/dL — ABNORMAL LOW (ref 30.0–36.0)
MCV: 101 fL — ABNORMAL HIGH (ref 80.0–100.0)
Monocytes Absolute: 0.6 10*3/uL (ref 0.1–1.0)
Monocytes Relative: 13 %
Neutro Abs: 2.5 10*3/uL (ref 1.7–7.7)
Neutrophils Relative %: 52 %
Platelets: 125 10*3/uL — ABNORMAL LOW (ref 150–400)
RBC: 4.06 MIL/uL — ABNORMAL LOW (ref 4.22–5.81)
RDW: 17.2 % — ABNORMAL HIGH (ref 11.5–15.5)
WBC: 4.8 10*3/uL (ref 4.0–10.5)
nRBC: 0 % (ref 0.0–0.2)

## 2023-08-29 LAB — COMPREHENSIVE METABOLIC PANEL
ALT: 10 U/L (ref 0–44)
AST: 23 U/L (ref 15–41)
Albumin: 2.9 g/dL — ABNORMAL LOW (ref 3.5–5.0)
Alkaline Phosphatase: 155 U/L — ABNORMAL HIGH (ref 38–126)
Anion gap: 12 (ref 5–15)
BUN: 40 mg/dL — ABNORMAL HIGH (ref 8–23)
CO2: 27 mmol/L (ref 22–32)
Calcium: 9.2 mg/dL (ref 8.9–10.3)
Chloride: 98 mmol/L (ref 98–111)
Creatinine, Ser: 2.82 mg/dL — ABNORMAL HIGH (ref 0.61–1.24)
GFR, Estimated: 22 mL/min — ABNORMAL LOW (ref >60)
Glucose, Bld: 100 mg/dL — ABNORMAL HIGH (ref 70–99)
Potassium: 3.6 mmol/L (ref 3.5–5.1)
Sodium: 137 mmol/L (ref 135–145)
Total Bilirubin: 2 mg/dL — ABNORMAL HIGH (ref 0.3–1.2)
Total Protein: 7.9 g/dL (ref 6.5–8.1)

## 2023-08-29 LAB — PROTIME-INR
INR: 1.4 — ABNORMAL HIGH (ref 0.8–1.2)
Prothrombin Time: 17.7 s — ABNORMAL HIGH (ref 11.4–15.2)

## 2023-08-29 MED ORDER — MORPHINE SULFATE (PF) 4 MG/ML IV SOLN
4.0000 mg | Freq: Once | INTRAVENOUS | Status: AC
  Administered 2023-08-29: 4 mg via INTRAVENOUS
  Filled 2023-08-29: qty 1

## 2023-08-29 MED ORDER — FENTANYL CITRATE PF 50 MCG/ML IJ SOSY
25.0000 ug | PREFILLED_SYRINGE | Freq: Once | INTRAMUSCULAR | Status: DC
  Filled 2023-08-29: qty 1

## 2023-08-29 MED ORDER — FENTANYL CITRATE PF 50 MCG/ML IJ SOSY
25.0000 ug | PREFILLED_SYRINGE | Freq: Once | INTRAMUSCULAR | Status: AC
  Administered 2023-08-29: 25 ug via INTRAVENOUS
  Filled 2023-08-29: qty 1

## 2023-08-29 MED ORDER — FENTANYL CITRATE PF 50 MCG/ML IJ SOSY
PREFILLED_SYRINGE | INTRAMUSCULAR | Status: AC | PRN
Start: 2023-08-29 — End: 2023-08-29
  Administered 2023-08-29: 25 ug via INTRAVENOUS

## 2023-08-29 MED ORDER — TRAMADOL HCL 50 MG PO TABS
50.0000 mg | ORAL_TABLET | Freq: Four times a day (QID) | ORAL | 0 refills | Status: DC | PRN
Start: 2023-08-29 — End: 2023-09-17

## 2023-08-29 MED ORDER — ONDANSETRON HCL 4 MG/2ML IJ SOLN
4.0000 mg | Freq: Once | INTRAMUSCULAR | Status: AC
  Administered 2023-08-29: 4 mg via INTRAVENOUS
  Filled 2023-08-29: qty 2

## 2023-08-29 MED ORDER — TETANUS-DIPHTH-ACELL PERTUSSIS 5-2.5-18.5 LF-MCG/0.5 IM SUSY
0.5000 mL | PREFILLED_SYRINGE | Freq: Once | INTRAMUSCULAR | Status: AC
  Administered 2023-08-29: 0.5 mL via INTRAMUSCULAR
  Filled 2023-08-29: qty 0.5

## 2023-08-29 NOTE — ED Notes (Addendum)
Quick clot guaze placed above left eye

## 2023-08-29 NOTE — ED Triage Notes (Signed)
Pt here from home with c/o fall landing on the floor , pt is on thinners , has abrasion above the left eye and small lac below the left eye

## 2023-08-29 NOTE — ED Notes (Signed)
Patient transported to CT 

## 2023-08-29 NOTE — Discharge Instructions (Addendum)
It was our pleasure to provide your ER care today - we hope that you feel better.  Fall precautions - use great care and/or assistance when up and about to help minimize risk of falling.   Keep head elevated as much as possible, even when sleeping, to help with swelling and pain. For nose and face fractures, follow up closely with ENT specialist in the coming week - call office Monday to arrange appointment. Follow up with eye specialist as discussed with them.   For left wrist radial styloid fracture, wear wrist splint, elevate wrist. Follow up with hand specialist in the coming week - call office Monday to arrange appointment.   Your imaging studies also made incidental note of non-trauma related findings including persistent right pleural effusion, ascites, as well as mediastinal lymphadenopathy - follow up with primary care doctor in the next 1-2 weeks - discuss these issues and follow up with them.   Use incentive spirometer - taking 10 full and complete breaths in every hour while awake.   Take acetaminophen as need for pain. You may also take ultram as need for pain - no driving when taking.   Return to ER if worse, new symptoms, fevers, new/severe pain, increased trouble breathing, or other concern.

## 2023-08-29 NOTE — ED Provider Notes (Signed)
Signed out to d/c to home if cts chest/abd neg for new trauma. Signed out that pt also has rad sty fx, rec outpatient f/u, and has periorbital abrasion/lac that ophthy is coming to see. RN indicates Dr Zenaida Niece assessed in ED signed that pt also has facial injuries/fractures, and that he has spoken with ent about facial injurys/fxs, and recommends outpatient f/u next week for those issues.   Ct chest/abd/pelvis neg for acute trauma. Old rib fxs noted-  pt confirms 4 remote, prior rib fxs. Mild left chest wall tenderness, no crepitus. Abd soft nt.   Rad styloid fx, splinted.   C spine non tender.   Morphine for pain. Zofran given as well. Pain controlled.   CT chest/abd/pelvis neg for new trauma, several incidental findings, chr ascites, chr effusion, etc.   Incentive spirometer for home.   Vitals normal, breathing comfortably, o2 sats 99%.   Pt currently appears stable for d/c.   Rec outpt f/u.  Return precautions provided.      Cathren Laine, MD 08/29/23 [phone]

## 2023-08-29 NOTE — ED Provider Notes (Addendum)
[facility] AT [facility] Provider Note   CSN: [id number] Arrival date & time: 08/29/23  0825     History  Chief Complaint  Patient presents with   Marletta Lor    Taylor [name] is a 82 y.o. male.  Patient brought in by [facility].  Patient stumbled outside over his feet and fell on his face on the concrete.  No loss of consciousness.  Patient is on blood thinner Eliquis.  Patient does have a pacemaker defibrillator.  Patient is followed at the [facility] in [place].  Patient last seen by them on September 26.  Patient cardiac history significant for ventricular fibrillation in for renal abdominal aortic aneurysm without rupture ischemic cardiomyopathy essential hypertension chronic systolic congestive heart failure.  Coronary artery disease involving coronary bypass graft of native heart without angina pectoralis.  Acute on chronic systolic congestive heart failure diabetes without complications hyperlipidemia history of CVA bilateral carotid stenosis and history of abdominal aortic aneurysm already mentioned above.  Patient states that he is somewhat chronically short of breath.  He is sort of at baseline shortness of breath.  Patient states that his tetanus is not up-to-date.  Patient arrived with cervical collar in place.  Patient denies any upper extremity or lower extremity pain.  No upper back or lower back pain.  Patient has multiple abrasions on the face.  Patient arrived as a level 2 trauma due to head injury being on blood thinners.  Trauma code activated.       Home Medications Prior to Admission medications   Medication Sig Start Date End Date Taking? Authorizing Provider  albuterol (VENTOLIN HFA) 108 (90 Base) MCG/ACT inhaler Inhale 2 puffs into the lungs every 6 (six) hours as needed for wheezing or shortness of breath. 01/03/23  Yes Malva Limes, MD  apixaban (ELIQUIS) 2.5 MG TABS tablet Take 1 tablet (2.5 mg total) by mouth 2 (two)  times daily. Patient taking differently: Take 5 mg by mouth 2 (two) times daily. 04/29/23  Yes Arnetha Courser, MD  aspirin 81 MG EC tablet Take 81 mg by mouth daily.    Yes [provider]  atorvastatin (LIPITOR) 40 MG tablet TAKE 1 TABLET(40 MG) BY MOUTH DAILY 05/13/23  Yes Malva Limes, MD  carvedilol (COREG) 6.25 MG tablet Take 1 tablet (6.25 mg total) by mouth 2 (two) times daily with a meal. Patient taking differently: Take 12.5 mg by mouth 2 (two) times daily with a meal. 02/27/23  Yes Sunnie Nielsen, DO  ipratropium-albuterol (DUONEB) 0.5-2.5 (3) MG/3ML SOLN Inhale 3 mLs into the lungs every 4 (four) hours as needed (wheezing, shob). 04/29/23 08/29/23 Yes Arnetha Courser, MD  isosorbide mononitrate (IMDUR) 30 MG 24 hr tablet TAKE 1/2 TABLET(15 MG) BY MOUTH DAILY 08/18/23  Yes Malva Limes, MD  JARDIANCE 10 MG TABS tablet Take 10 mg by mouth daily.   Yes [provider]  Magnesium 200 MG TABS Take 1 tablet (200 mg total) by mouth in the morning and at bedtime. 11/11/22  Yes Clarisa Kindred A, FNP  pantoprazole (PROTONIX) 40 MG tablet TAKE 1 TABLET BY MOUTH EVERY DAY 11/21/22  Yes Malva Limes, MD  potassium chloride SA (KLOR-CON M) 20 MEQ tablet Take 2 tablets (40 mEq total) by mouth daily. Patient taking differently: Take 20 mEq by mouth daily. 08/15/23  Yes Malva Limes, MD  spironolactone (ALDACTONE) 25 MG tablet Take 1 tablet (25 mg total) by mouth daily. 04/30/23  Yes  Arnetha Courser, MD  torsemide (DEMADEX) 20 MG tablet Take 3 tablets (60 mg total) by mouth 2 (two) times daily. Patient taking differently: Take 40 mg by mouth 2 (two) times daily. 07/21/23 10/19/23 Yes Clarisa Kindred A, FNP  triamcinolone ointment (KENALOG) 0.1 % APPLY TOPICALLY TWICE DAILY AS DIRECTED 07/15/22  Yes Malva Limes, MD  acetaminophen (TYLENOL) 500 MG tablet Take 500 mg by mouth every 6 (six) hours as needed for mild pain.    [provider]      Allergies    Amlodipine  besylate, Rocephin [ceftriaxone], and Crestor [rosuvastatin]    Review of Systems   Review of Systems  Constitutional:  Negative for chills and fever.  HENT:  Negative for ear pain and sore throat.   Eyes:  Negative for pain and visual disturbance.  Respiratory:  Negative for cough and shortness of breath.   Cardiovascular:  Negative for chest pain and palpitations.  Gastrointestinal:  Negative for abdominal pain and vomiting.  Genitourinary:  Negative for dysuria and hematuria.  Musculoskeletal:  Negative for arthralgias and back pain.  Skin:  Positive for wound. Negative for color change and rash.  Neurological:  Negative for seizures and syncope.  Hematological:  Bruises/bleeds easily.  All other systems reviewed and are negative.   Physical Exam Updated Vital Signs BP 128/68 (BP Location: Right Arm)   Pulse 70   Temp (!) 97.5 F (36.4 C) (Oral)   Resp (!) 22  Physical Exam Vitals and nursing note reviewed.  Constitutional:      General: He is not in acute distress.    Appearance: Normal appearance. He is well-developed.  HENT:     Head: Normocephalic.  Eyes:     Extraocular Movements: Extraocular movements intact.     Conjunctiva/sclera: Conjunctivae normal.     Pupils: Pupils are equal, round, and reactive to light.  Neck:     Comments: Cervical collar in place. Cardiovascular:     Rate and Rhythm: Normal rate and regular rhythm.     Heart sounds: No murmur heard. Pulmonary:     Effort: Pulmonary effort is normal. No respiratory distress.     Breath sounds: Normal breath sounds.     Comments: Pacemaker defibrillator pouch left anterior chest. Abdominal:     Palpations: Abdomen is soft.     Tenderness: There is no abdominal tenderness.  Musculoskeletal:        General: No swelling.  Skin:    General: Skin is warm and dry.     Capillary Refill: Capillary refill takes less than 2 seconds.  Neurological:     General: No focal deficit present.     Mental Status:  He is alert and oriented to person, place, and time.  Psychiatric:        Mood and Affect: Mood normal.        ED Results / Procedures / Treatments   Labs (all labs ordered are listed, but only abnormal results are displayed) Labs Reviewed  COMPREHENSIVE METABOLIC PANEL - Abnormal; Notable for the following components:      Result Value   Glucose, Bld 100 (*)    BUN 40 (*)    Creatinine, Ser 2.82 (*)    Albumin 2.9 (*)    Alkaline Phosphatase 155 (*)    Total Bilirubin 2.0 (*)    GFR, Estimated 22 (*)    All other components within normal limits  CBC WITH DIFFERENTIAL/PLATELET - Abnormal; Notable for the following components:   RBC  4.06 (*)    Hemoglobin 12.2 (*)    MCV 101.0 (*)    MCHC 29.8 (*)    RDW 17.2 (*)    Platelets 125 (*)    All other components within normal limits  PROTIME-INR - Abnormal; Notable for the following components:   Prothrombin Time 17.7 (*)    INR 1.4 (*)    All other components within normal limits    EKG EKG Interpretation Date/Time:  Friday August 29 2023 09:13:18 EDT Ventricular Rate:  60 PR Interval:    QRS Duration:  216 QT Interval:  548 QTC Calculation: 548 R Axis:   -75  Text Interpretation: VENTRICULAR PACED RHYTHM IVCD, consider atypical RBBB LVH with secondary repolarization abnormality Confirmed by Vanetta Mulders [phone]) on 08/29/2023 9:45:06 AM  Radiology DG Wrist Complete Left  Result Date: 08/29/2023 CLINICAL DATA:  Status post fall snuffbox tenderness EXAM: LEFT WRIST - COMPLETE 3+ VIEW COMPARISON:  None Available. FINDINGS: Minimally displaced and comminuted fracture involving the radial styloid. No other fractures identified. Osteopenia. Mild soft tissue swelling surrounding the wrist. IMPRESSION: Minimally displaced and comminuted radial styloid fracture. Electronically Signed   By: Olive Bass M.D.   On: 08/29/2023 15:05   CT Head Wo Contrast  Result Date: 08/29/2023 CLINICAL DATA:  Provided history: Head  trauma, minor. Facial trauma, blunt. Neck trauma. Additional history obtained from electronic MEDICAL RECORD NUMBERFall, on blood thinners, abrasion above left eye, laceration below left eye. EXAM: CT HEAD WITHOUT CONTRAST CT MAXILLOFACIAL WITHOUT CONTRAST CT CERVICAL SPINE WITHOUT CONTRAST TECHNIQUE: Multidetector CT imaging of the head, cervical spine, and maxillofacial structures were performed using the standard protocol without intravenous contrast. Multiplanar CT image reconstructions of the cervical spine and maxillofacial structures were also generated. RADIATION DOSE REDUCTION: This exam was performed according to the departmental dose-optimization program which includes automated exposure control, adjustment of the mA and/or kV according to patient size and/or use of iterative reconstruction technique. COMPARISON:  Head CT 04/23/2023.  Cervical spine CT 04/23/2023. FINDINGS: CT HEAD FINDINGS Brain: Generalized cerebral atrophy. Unchanged chronic cortical/subcortical infarct within the left occipital lobe (PCA territory). Background mild patchy and ill-defined hypoattenuation within the cerebral white matter, nonspecific but compatible with chronic small vessel ischemic disease. There is no acute intracranial hemorrhage. No acute demarcated cortical infarct. No extra-axial fluid collection. No evidence of an intracranial mass. No midline shift. Vascular: No hyperdense vessel.  Atherosclerotic calcifications. Skull: No acute calvarial fracture. Unchanged chronic fracture deformity of the left parietal and squamous left temporal bones. Other: Left forehead/periorbital hematoma. CT MAXILLOFACIAL FINDINGS Osseous: Acute, displaced fractures of the bilateral nasal bones and frontal process of left maxilla. No acute maxillofacial fracture identified elsewhere. Unchanged chronic irregularity of the nasal spine of maxilla. Orbits: Left forehead and periorbital hematoma. No acute finding within the orbits. Sinuses:  Complete opacification of the bilateral maxillary sinuses (with associated chronic reactive osteitis). Near complete opacification of the right sphenoid sinus (with associated chronic reactive osteitis). Complete opacification of the left sphenoid sinus (with associated chronic reactive osteitis. Severe bilateral ethmoid sinusitis. Extensive opacification of both frontal sinuses inferiorly. Soft tissues: Left forehead/periorbital hematoma. Nasal soft tissue swelling. CT CERVICAL SPINE FINDINGS Alignment: Nonspecific straightening of the expected cervical or doses. No significant spondylolisthesis. Skull base and vertebrae: The basion-dental and atlanto-dental intervals are maintained.No evidence of acute fracture to the cervical spine. C6-C7 vertebral body ankylosis. Soft tissues and spinal canal: No prevertebral fluid or swelling. No visible canal hematoma. Disc levels: Spondylosis with multilevel disc space narrowing,  disc bulges/central disc protrusions, posterior disc osteophyte complexes, endplate spurring, uncovertebral hypertrophy and facet arthrosis. Vertebral body ankylosis at C6-C7. Multilevel spinal canal narrowing. Most notably at C6-C7, a posterior disc osteophyte complex contributes to at least moderate spinal canal stenosis. Multilevel bony neural foraminal narrowing. Multilevel ventral osteophytes. Upper chest: Partially imaged right pleural effusion. Biapical pleuroparenchymal scarring. Other: Left carotid artery stent. IMPRESSION: CT head: 1. No evidence of an acute intracranial abnormality. 2. Left forehead/periorbital hematoma. CT maxillofacial: 1. Acute, displaced fractures of the bilateral nasal bones and frontal process of left maxilla. Overlying nasal soft tissue swelling. 2. Left forehead and periorbital hematoma. 3. Pansinusitis (overall severe). CT cervical spine: 1. No evidence of an acute cervical spine fracture. 2. Nonspecific straightening of the expected cervical lordosis. 3. Cervical  spondylosis as described. At C6-C7, a posterior disc osteophyte complex contributes to at least moderate spinal canal stenosis. 4. C6-C7 vertebral body ankylosis. 5. Partially imaged right pleural effusion. Electronically Signed   By: Jackey Loge D.O.   On: 08/29/2023 09:25   CT Cervical Spine Wo Contrast  Result Date: 08/29/2023 CLINICAL DATA:  Provided history: Head trauma, minor. Facial trauma, blunt. Neck trauma. Additional history obtained from electronic MEDICAL RECORD NUMBERFall, on blood thinners, abrasion above left eye, laceration below left eye. EXAM: CT HEAD WITHOUT CONTRAST CT MAXILLOFACIAL WITHOUT CONTRAST CT CERVICAL SPINE WITHOUT CONTRAST TECHNIQUE: Multidetector CT imaging of the head, cervical spine, and maxillofacial structures were performed using the standard protocol without intravenous contrast. Multiplanar CT image reconstructions of the cervical spine and maxillofacial structures were also generated. RADIATION DOSE REDUCTION: This exam was performed according to the departmental dose-optimization program which includes automated exposure control, adjustment of the mA and/or kV according to patient size and/or use of iterative reconstruction technique. COMPARISON:  Head CT 04/23/2023.  Cervical spine CT 04/23/2023. FINDINGS: CT HEAD FINDINGS Brain: Generalized cerebral atrophy. Unchanged chronic cortical/subcortical infarct within the left occipital lobe (PCA territory). Background mild patchy and ill-defined hypoattenuation within the cerebral white matter, nonspecific but compatible with chronic small vessel ischemic disease. There is no acute intracranial hemorrhage. No acute demarcated cortical infarct. No extra-axial fluid collection. No evidence of an intracranial mass. No midline shift. Vascular: No hyperdense vessel.  Atherosclerotic calcifications. Skull: No acute calvarial fracture. Unchanged chronic fracture deformity of the left parietal and squamous left temporal bones. Other:  Left forehead/periorbital hematoma. CT MAXILLOFACIAL FINDINGS Osseous: Acute, displaced fractures of the bilateral nasal bones and frontal process of left maxilla. No acute maxillofacial fracture identified elsewhere. Unchanged chronic irregularity of the nasal spine of maxilla. Orbits: Left forehead and periorbital hematoma. No acute finding within the orbits. Sinuses: Complete opacification of the bilateral maxillary sinuses (with associated chronic reactive osteitis). Near complete opacification of the right sphenoid sinus (with associated chronic reactive osteitis). Complete opacification of the left sphenoid sinus (with associated chronic reactive osteitis. Severe bilateral ethmoid sinusitis. Extensive opacification of both frontal sinuses inferiorly. Soft tissues: Left forehead/periorbital hematoma. Nasal soft tissue swelling. CT CERVICAL SPINE FINDINGS Alignment: Nonspecific straightening of the expected cervical or doses. No significant spondylolisthesis. Skull base and vertebrae: The basion-dental and atlanto-dental intervals are maintained.No evidence of acute fracture to the cervical spine. C6-C7 vertebral body ankylosis. Soft tissues and spinal canal: No prevertebral fluid or swelling. No visible canal hematoma. Disc levels: Spondylosis with multilevel disc space narrowing, disc bulges/central disc protrusions, posterior disc osteophyte complexes, endplate spurring, uncovertebral hypertrophy and facet arthrosis. Vertebral body ankylosis at C6-C7. Multilevel spinal canal narrowing. Most notably at C6-C7, a posterior  disc osteophyte complex contributes to at least moderate spinal canal stenosis. Multilevel bony neural foraminal narrowing. Multilevel ventral osteophytes. Upper chest: Partially imaged right pleural effusion. Biapical pleuroparenchymal scarring. Other: Left carotid artery stent. IMPRESSION: CT head: 1. No evidence of an acute intracranial abnormality. 2. Left forehead/periorbital hematoma. CT  maxillofacial: 1. Acute, displaced fractures of the bilateral nasal bones and frontal process of left maxilla. Overlying nasal soft tissue swelling. 2. Left forehead and periorbital hematoma. 3. Pansinusitis (overall severe). CT cervical spine: 1. No evidence of an acute cervical spine fracture. 2. Nonspecific straightening of the expected cervical lordosis. 3. Cervical spondylosis as described. At C6-C7, a posterior disc osteophyte complex contributes to at least moderate spinal canal stenosis. 4. C6-C7 vertebral body ankylosis. 5. Partially imaged right pleural effusion. Electronically Signed   By: Jackey Loge D.O.   On: 08/29/2023 09:25   CT Maxillofacial WO CM  Result Date: 08/29/2023 CLINICAL DATA:  Provided history: Head trauma, minor. Facial trauma, blunt. Neck trauma. Additional history obtained from electronic MEDICAL RECORD NUMBERFall, on blood thinners, abrasion above left eye, laceration below left eye. EXAM: CT HEAD WITHOUT CONTRAST CT MAXILLOFACIAL WITHOUT CONTRAST CT CERVICAL SPINE WITHOUT CONTRAST TECHNIQUE: Multidetector CT imaging of the head, cervical spine, and maxillofacial structures were performed using the standard protocol without intravenous contrast. Multiplanar CT image reconstructions of the cervical spine and maxillofacial structures were also generated. RADIATION DOSE REDUCTION: This exam was performed according to the departmental dose-optimization program which includes automated exposure control, adjustment of the mA and/or kV according to patient size and/or use of iterative reconstruction technique. COMPARISON:  Head CT 04/23/2023.  Cervical spine CT 04/23/2023. FINDINGS: CT HEAD FINDINGS Brain: Generalized cerebral atrophy. Unchanged chronic cortical/subcortical infarct within the left occipital lobe (PCA territory). Background mild patchy and ill-defined hypoattenuation within the cerebral white matter, nonspecific but compatible with chronic small vessel ischemic disease. There  is no acute intracranial hemorrhage. No acute demarcated cortical infarct. No extra-axial fluid collection. No evidence of an intracranial mass. No midline shift. Vascular: No hyperdense vessel.  Atherosclerotic calcifications. Skull: No acute calvarial fracture. Unchanged chronic fracture deformity of the left parietal and squamous left temporal bones. Other: Left forehead/periorbital hematoma. CT MAXILLOFACIAL FINDINGS Osseous: Acute, displaced fractures of the bilateral nasal bones and frontal process of left maxilla. No acute maxillofacial fracture identified elsewhere. Unchanged chronic irregularity of the nasal spine of maxilla. Orbits: Left forehead and periorbital hematoma. No acute finding within the orbits. Sinuses: Complete opacification of the bilateral maxillary sinuses (with associated chronic reactive osteitis). Near complete opacification of the right sphenoid sinus (with associated chronic reactive osteitis). Complete opacification of the left sphenoid sinus (with associated chronic reactive osteitis. Severe bilateral ethmoid sinusitis. Extensive opacification of both frontal sinuses inferiorly. Soft tissues: Left forehead/periorbital hematoma. Nasal soft tissue swelling. CT CERVICAL SPINE FINDINGS Alignment: Nonspecific straightening of the expected cervical or doses. No significant spondylolisthesis. Skull base and vertebrae: The basion-dental and atlanto-dental intervals are maintained.No evidence of acute fracture to the cervical spine. C6-C7 vertebral body ankylosis. Soft tissues and spinal canal: No prevertebral fluid or swelling. No visible canal hematoma. Disc levels: Spondylosis with multilevel disc space narrowing, disc bulges/central disc protrusions, posterior disc osteophyte complexes, endplate spurring, uncovertebral hypertrophy and facet arthrosis. Vertebral body ankylosis at C6-C7. Multilevel spinal canal narrowing. Most notably at C6-C7, a posterior disc osteophyte complex  contributes to at least moderate spinal canal stenosis. Multilevel bony neural foraminal narrowing. Multilevel ventral osteophytes. Upper chest: Partially imaged right pleural effusion. Biapical pleuroparenchymal scarring. Other:  Left carotid artery stent. IMPRESSION: CT head: 1. No evidence of an acute intracranial abnormality. 2. Left forehead/periorbital hematoma. CT maxillofacial: 1. Acute, displaced fractures of the bilateral nasal bones and frontal process of left maxilla. Overlying nasal soft tissue swelling. 2. Left forehead and periorbital hematoma. 3. Pansinusitis (overall severe). CT cervical spine: 1. No evidence of an acute cervical spine fracture. 2. Nonspecific straightening of the expected cervical lordosis. 3. Cervical spondylosis as described. At C6-C7, a posterior disc osteophyte complex contributes to at least moderate spinal canal stenosis. 4. C6-C7 vertebral body ankylosis. 5. Partially imaged right pleural effusion. Electronically Signed   By: Jackey Loge D.O.   On: 08/29/2023 09:25   DG Chest Port 1 View  Result Date: 08/29/2023 CLINICAL DATA:  Fall EXAM: PORTABLE CHEST 1 VIEW COMPARISON:  Chest x-ray dated May 05, 2023 FINDINGS: Unchanged enlarged cardiac and mediastinal contours. Prior median sternotomy and CABG. Left chest wall AICD with unchanged lead positioning. Increased diffuse interstitial opacities. Probable trace right pleural effusion. No evidence of pneumothorax. IMPRESSION: 1. Increased diffuse interstitial opacities, likely due to pulmonary edema. 2. Probable trace right pleural effusion. Electronically Signed   By: Allegra Lai M.D.   On: 08/29/2023 08:47    Procedures Procedures    Medications Ordered in ED Medications  fentaNYL (SUBLIMAZE) injection (25 mcg Intravenous Given 08/29/23 0913)  fentaNYL (SUBLIMAZE) injection 25 mcg (has no administration in time range)  ondansetron (ZOFRAN) injection 4 mg (4 mg Intravenous Given 08/29/23 0914)  fentaNYL  (SUBLIMAZE) injection 25 mcg (25 mcg Intravenous Given 08/29/23 1326)  Tdap (BOOSTRIX) injection 0.5 mL (0.5 mLs Intramuscular Given 08/29/23 1005)    ED Course/ Medical Decision Making/ A&P                                 Medical Decision Making Amount and/or Complexity of Data Reviewed Labs: ordered. Radiology: ordered.  Risk Prescription drug management.   CRITICAL CARE Performed by: Vanetta Mulders Total critical care time: 35 minutes Critical care time was exclusive of separately billable procedures and treating other patients. Critical care was necessary to treat or prevent imminent or life-threatening deterioration. Critical care was time spent personally by me on the following activities: development of treatment plan with patient and/or surrogate as well as nursing, discussions with consultants, evaluation of patient's response to treatment, examination of patient, obtaining history from patient or surrogate, ordering and performing treatments and interventions, ordering and review of laboratory studies, ordering and review of radiographic studies, pulse oximetry and re-evaluation of patient's condition.  Patient's complete metabolic panel is significant for bilirubin of 2.0 alk phos 155.  Patient without any abdominal pain.  Patient's GFR is 22.  CBC no leukocytosis hemoglobin 12.2 platelets are good at 125.  Patient's INR is 1.4.  CT head without any acute findings CT cervical spine without any acute findings.  But CT maxillofacial had acute displaced fractures of the bilateral nasal bones and frontal process of the left maxilla.  Patient's portable chest x-ray had increased diffuse interstitial opacities likely due to pulmonary edema probable trace right pleural effusion.  Will have ophthalmology take a look at the left lower eyelid avulsion laceration.  Right along the eyelid margin.  To see if they think that needs repair or whether they can heal by secondary intention.     Patient with about a 1 cm sort of punctate laceration to the left forehead.  Where there is a  large hematoma.  No active bleeding there that somewhat sealed.  Also will discuss the facial fractures with on-call ear nose and throat Dr. Jearld Fenton.  Patient discussed with Dr. Jearld Fenton.  Says that the facial fracture will heal.  He can follow-up in the clinic if discharged with ear nose and throat regarding the nose.  But no significant deformity.  Also spoke with ophthalmology.  Original plan was to have him come to Dr. Raeanne Gathers office.  The patient went on to develop additional symptoms.  Was complaining of left wrist pain at the snuffbox area.  So we need to evaluate for wrist fracture.  Could be an occult navicular injury and that could be treated with a Velcro splint.  Patient also developed new left upper quadrant and left lower rib pain.  So based on this we will get CT chest abdomen pelvis this will be without contrast because of his renal function and GFR being 22.  But that is somewhat baseline for him.  Recontacted ophthalmology Dr. Zenaida Niece.  She is going to come in and take a look at the eye prickly of the lower lid.  Patient went on to have more of a hematoma in the left forehead and a lot of swelling to the left upper lid.  Prior to that patient's eye itself was completely normal.  No hyphema good extraocular motion in all directions.  If the CT show anything of concern would be helpful if we could find a reason for this patient to be admitted.  Feel that all the swelling is probably secondary to the Eliquis.  Probably hematoma all secondary to that.  But if he shows any rib fractures probably reasonable to discuss with trauma and have been admitted.  Ophthalmology Dr. Zenaida Niece is coming in to see him in regard to the avulsion laceration to the left lower eyelid.    Final Clinical Impression(s) / ED Diagnoses Final diagnoses:  Fall, initial encounter  Injury of head, initial encounter  Traumatic  hematoma of forehead, initial encounter  Wrist pain, acute, left  Left upper quadrant abdominal pain    Rx / DC Orders ED Discharge Orders     None         Vanetta Mulders, MD 08/29/23 1036    Vanetta Mulders, MD 08/29/23 1039    Vanetta Mulders, MD 08/29/23 1528

## 2023-09-02 NOTE — Progress Notes (Deleted)
PCP: Mila Merry, MD (last seen 09/24) Primary Cardiologist: Marcina Millard, MD (last seen 09/24) ADHF provider: Florestine Avers, MD (last seen 07/24)   HPI:  Taylor [name] is a 82 y/o male with a history of AAA, bladder cancer, DM, atrial fibrillation, CAD s/p DES, hyperlipidemia, CKD, restrictive lung disease,HTN, stroke, liver cirrhosis, AICD, GERD, melanoma, pancreatitis, sleep apnea, VF and chronic heart failure. Had paracentesis done 07/29/23 with removal of 4.8L. Previous paracentesis done 04/24/23 with removal of 6.8L  Was in the ED 10/27/22 due to nausea epigastric discomfort some shortness of breath. Admitted 10/30/22 due to DOE and orthopnea x 7 days. Chest xray showed cardiomegaly and central vascular congestion. Cardiology & nephrology consults obtained. Given IV lasix. Cr worsened with diuretics so discontinued and put on gentle fluids in setting of HF. Placed on oxygen. Discharged after 3 days.   Admitted 02/25/23 due to worsening SOB due to acute/ chronic HF. Placed on oxygen. Chest x-ray with enlargement of cardiac silhouette and mild pulmonary edema. CT of the chest abdomen pelvis with moderate to severe ascites with anasarca. Paracentesis yielded 6L fluids. Jardiance held due to AKI.    Admitted 04/03/23 due to worsening lower extremity swelling, abdominal distention, shortness of breath and productive cough for last few days. Had not been taking diuretic per cardiology. Needed 4L supplemental oxygen. Chest x-ray shows cardiomegaly with mild to moderate pulmonary edema and bilateral pleural effusion. CT abdomen shows moderate ascites with anasarca, nodular liver and severe atrophy of left kidney. IV diuresed. Underwent ultrasound-guided paracentesis 6.6 L of clear fluid drained. Patient was continued on IV Lasix. Admitted 04/23/23 due to weight gain, shortness of breath, and falling at [facility] hardware store. IV diuresed. S/p paracentesis with removal of 6.8 L of amber-colored fluid. Placed back  on oral diuretics.   Was in the ED 08/29/23 due to mechanical fall. Stumbled outside over his feet and fell on his face on the concrete. No loss of consciousness. Facial fracture, rad styloid fx. Hematoma around left eye.   Echo 11/13/19: EF 20-25% with mild LAE/RAE and moderate Taylor/TR Echo 08/10/20: EF 35-40% along with mild Taylor and borderline dilatation of aortic root at 39 mm Echo 10/30/22: EF of <20% along with mild LVH, severe LAE, severe Taylor, mild AR and moderate/severe TR.   Stress test 05/07/21: Moderate to severe LV systolic dysfunction  Large perfusion abnormality of severe intensity in the anterior apical  myocardial perfusion distribution consistent with previous infarct and/or  scar without evidence of myocardial ischemia   RHC/LHC 10/02/12: Left Main:  normal       LAD system:  significant       LCX system:  significant       RCA system:  normal       Number of vessels with significant CAD:  2 - Vessel   Left Ventriculogram       Ejection Fraction:   65%   He presents today for a HF follow-up visit with a chief complaint of         ROS: All systems negative except as listed in HPI, PMH and Problem List.  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  Independent at baseline.   Social Determinants of Health   Financial Resource Strain: Low Risk  (05/12/2023)   Overall Financial Resource Strain (CARDIA)    Difficulty of Paying Living Expenses: Not hard at all  Food Insecurity: No Food Insecurity (05/12/2023)   Hunger Vital Sign    Worried About Running Out of Food in the Last Year: Never true    Ran Out of Food in the Last Year: Never true  Transportation Needs: No Transportation Needs (05/12/2023)   PRAPARE - Administrator, Civil Service (Medical): No    Lack of Transportation (Non-Medical): No  Physical Activity: Insufficiently Active (05/12/2023)   Exercise Vital Sign    Days of Exercise per Week: 4 days    Minutes of Exercise per Session: 30 min  Stress: Stress Concern Present (05/12/2023)   [employer] of Occupational Health - Occupational Stress Questionnaire    Feeling of Stress : To some extent  Social Connections: Moderately Isolated (05/12/2023)   Social Connection and Isolation Panel [NHANES]    Frequency of Communication with Friends and Family: Once a week    Frequency of Social Gatherings with Friends and Family: Once a week    Attends Religious Services: More than 4 times per year    Active Member of [employer] or Organizations: No    Attends Banker Meetings: Never    Marital Status: Married  Catering manager Violence: Not At Risk (05/12/2023)   Humiliation, Afraid, Rape, and Kick questionnaire    Fear of Current or Ex-Partner: No    Emotionally Abused: No    Physically Abused: No    Sexually Abused: No    FH:  Family History  Problem Relation Age of Onset   Cancer Mother        Melanoma skin cancer   Heart attack Father 8   Cancer Father        throat cancer   Arthritis Brother     Past Medical History:  Diagnosis Date   AAA (abdominal aortic aneurysm) (HCC) 06/03/2007   [facility]; Dr. Hart [place]   AICD (automatic cardioverter/defibrillator)  present    Arrhythmia    atrial fibrillation   Barrett's esophagus    Bladder cancer (HCC)    Bradycardia    CAD (coronary artery disease)    CAP (community acquired pneumonia) 11/13/2019   CHF (congestive heart failure) (HCC)    Cluster headache    COVID-19 09/27/2021   DDD (degenerative disc disease), lumbar    Diabetes mellitus without complication (HCC)    Dyspnea    WITH EXERTION   Edema    LEFT ANKLE   Fracture of skull base (HCC) 1997   due to fall   GERD (gastroesophageal reflux disease)    Gout    History of bladder cancer 12/1995   Hyperlipidemia    Hypertension    Hypocalcemia 04/19/2020   Possibly secondary to  diuretics.   Low=5.9 04/19/2020   Malignant melanoma (HCC) 12/2012   right dorsal forearm excised   Myocardial infarction [facility])    LAST 2014   Osteoarthritis of knee    Other specified complication of vascular prosthetic devices, implants and grafts, initial encounter (HCC) 08/22/2021   Pacemaker 10/10/2006   Pancreatitis 11/22/2019   Pneumonia    2016   Pre-diabetes    Psoriasis    Rib fracture 1997   due to fall   Sleep apnea    CPAP   Stroke (HCC)    Venous incompetence     Current Outpatient Medications  Medication Sig Dispense Refill   acetaminophen (TYLENOL) 500 MG tablet Take 500 mg by mouth every 6 (six) hours as needed for mild pain.     albuterol (VENTOLIN HFA) 108 (90 Base) MCG/ACT inhaler Inhale 2 puffs into the lungs every 6 (six) hours as needed for wheezing or shortness of breath. 8 g 2   apixaban (ELIQUIS) 2.5 MG TABS tablet Take 1 tablet (2.5 mg total) by mouth 2 (two) times daily. (Patient taking differently: Take 5 mg by mouth 2 (two) times daily.) 60 tablet 1   aspirin 81 MG EC tablet Take 81 mg by mouth daily.      atorvastatin (LIPITOR) 40 MG tablet TAKE 1 TABLET(40 MG) BY MOUTH DAILY 90 tablet 1   carvedilol (COREG) 6.25 MG tablet Take 1 tablet (6.25 mg total) by mouth 2 (two) times daily with a meal. (Patient taking  differently: Take 12.5 mg by mouth 2 (two) times daily with a meal.) 60 tablet 0   ipratropium-albuterol (DUONEB) 0.5-2.5 (3) MG/3ML SOLN Inhale 3 mLs into the lungs every 4 (four) hours as needed (wheezing, shob). 360 mL 1   isosorbide mononitrate (IMDUR) 30 MG 24 hr tablet TAKE 1/2 TABLET(15 MG) BY MOUTH DAILY 45 tablet 3   JARDIANCE 10 MG TABS tablet Take 10 mg by mouth daily.     Magnesium 200 MG TABS Take 1 tablet (200 mg total) by mouth in the morning and at bedtime. 60 tablet 5   pantoprazole (PROTONIX) 40 MG tablet TAKE 1 TABLET BY MOUTH EVERY DAY 90 tablet 4   potassium chloride SA (KLOR-CON M) 20 MEQ tablet Take 2 tablets (40 mEq total) by mouth daily. (Patient taking differently: Take 20 mEq by mouth daily.) 180 tablet 1   spironolactone (ALDACTONE) 25 MG tablet Take 1 tablet (25 mg total) by mouth daily. 30 tablet 1   torsemide (DEMADEX) 20 MG tablet Take 3 tablets (60 mg total) by mouth 2 (two) times daily. (Patient taking differently: Take 40 mg by mouth 2 (two) times daily.) 180 tablet 5   traMADol (ULTRAM) 50 MG tablet Take 1 tablet (50 mg total) by mouth every 6 (six) hours as needed. 20 tablet 0   triamcinolone ointment (KENALOG) 0.1 % APPLY TOPICALLY TWICE DAILY AS DIRECTED 454 g 3   No current facility-administered medications for this visit.     PHYSICAL EXAM:  General:  Well appearing. No resp difficulty HEENT: normal Neck: supple. JVP elevated. No lymphadenopathy or thryomegaly appreciated. Cor: PMI normal. Regular rate & rhythm. No rubs, gallops or murmurs. Lungs: clear Abdomen: soft, nontender, distended but softer. No hepatosplenomegaly. No bruits or masses. Good bowel sounds. Extremities: no cyanosis, clubbing, rash, 1+ pitting edema bilateral lower legs Neuro: alert & orientedx3, cranial nerves grossly intact. Moves all 4 extremities w/o difficulty. Affect pleasant.   ECG: not done  ReDs:    ASSESSMENT &  PLAN:  1: Ischemic heart failure with reduced  ejection fraction- - NYHA class III - fluid overloaded with continued symptoms and elevated ReDs reading, although symptoms and ReDs improving - weighing daily; weight chart reviewed and shows weight loss of 11 pounds in last 4 days; reminded to call for an overnight weight gain of > 2 pounds or a weekly weight gain of > 5 pounds - weight 167 from last visit here 1 month ago - Echo 11/13/19: EF 20-25% with mild LAE/RAE and moderate Taylor/TR - Echo 08/10/20: EF 35-40% along with mild Taylor and borderline dilatation of aortic root at 39 mm - Echo 10/30/22: EF of <20% along with mild LVH, severe LAE, severe Taylor, mild AR and moderate/severe TR.  - Stress test 05/07/21 showed moderate to severe LV systolic dysfunction, large perfusion abnormality of severe intensity in the anterior apical myocardial perfusion distribution consistent with previous infarct and/or scar without evidence of myocardial ischemia  - Reds   - had paracentesis on 07/29/23 with removal of 4.8L (has a history of portal HTN) - previous paracentesis done 04/24/23 with removal of 6.8L - not adding salt to his food - continue carvedilol 6.25mg  BID - continue torsemide 60mg  BID/ potassium daily - continue jardiance 10mg  daily  - consider entresto if BP allows - saw [facility] Allena Katz) 07/24 - has AICD - Mg 04/05/23 was 2.3 - resume wearing compression socks daily with removal at bedtime - BNP 07/31/23 was 901.7   2: HTN- - BP  - saw PCP Sherrie Mustache) 09/24 - BMP 08/29/23 reviewed and showed sodium 137, potassium 3.6, creatinine 2.82 and GFR 22 - saw nephrology Suezanne Jacquet) 08/24  3: Atrial fibrillation- - saw cardiology (Paraschos) 09/24 - continue apixaban 2.5mg  BID - has pacemaker with OptiVol capacity for fluid monitoring - battery change out done 02/13/23  4: CAD- - CABG 2007 - continue atorvastatin 40mg  daily - RHC/LHC done 10/02/12:      Left Main:  normal       LAD system:  significant       LCX system:  significant        RCA system:  normal       Left Ventriculogram Ejection Fraction:   65%   5: DM with CKD-  - A1c 08/15/23 was 6.6%  6: Sleep apnea- - wearing CPAP nightly - saw pulmonology Karna Christmas) 03/24 - wearing oxygen at 2L with his CPAP

## 2023-09-03 ENCOUNTER — Encounter: Payer: Medicare Other | Admitting: Family

## 2023-09-03 ENCOUNTER — Telehealth: Payer: Self-pay

## 2023-09-03 NOTE — Telephone Encounter (Signed)
Spoke with pt spouse Taylor [name]) on the phone.  Spouse wished to rescheduled Taylor [name] appointment since he is feeling sore since his fall. Appointment reschedule per spouse request.

## 2023-09-04 ENCOUNTER — Encounter (INDEPENDENT_AMBULATORY_CARE_PROVIDER_SITE_OTHER): Payer: Self-pay | Admitting: Otolaryngology

## 2023-09-04 ENCOUNTER — Ambulatory Visit (INDEPENDENT_AMBULATORY_CARE_PROVIDER_SITE_OTHER): Payer: Medicare Other | Admitting: Otolaryngology

## 2023-09-04 ENCOUNTER — Ambulatory Visit: Payer: Self-pay | Admitting: *Deleted

## 2023-09-04 VITALS — BP 103/66 | HR 87

## 2023-09-04 DIAGNOSIS — S022XXA Fracture of nasal bones, initial encounter for closed fracture: Secondary | ICD-10-CM | POA: Diagnosis not present

## 2023-09-04 DIAGNOSIS — W19XXXA Unspecified fall, initial encounter: Secondary | ICD-10-CM

## 2023-09-04 NOTE — Progress Notes (Signed)
Taylor [name] is an 82 y.o. male.   Chief Complaint: facial fractures HPI: hx of fall and has no problems with diplopia, malocclusion, and deviation of the nose. He has no nasal obstruction. He has some occasional blurred vision. He has opthal appointment.   Past Medical History:  Diagnosis Date   AAA (abdominal aortic aneurysm) (HCC) 06/03/2007   [facility]; Dr. Hart [place]   AICD (automatic cardioverter/defibrillator) present    Arrhythmia    atrial fibrillation   Barrett's esophagus    Bladder cancer (HCC)    Bradycardia    CAD (coronary artery disease)    CAP (community acquired pneumonia) 11/13/2019   CHF (congestive heart failure) (HCC)    Cluster headache    COVID-19 09/27/2021   DDD (degenerative disc disease), lumbar    Diabetes mellitus without complication (HCC)    Dyspnea    WITH EXERTION   Edema    LEFT ANKLE   Fracture of skull base (HCC) 1997   due to fall   GERD (gastroesophageal reflux disease)    Gout    History of bladder cancer 12/1995   Hyperlipidemia    Hypertension    Hypocalcemia 04/19/2020   Possibly secondary to diuretics.   Low=5.9 04/19/2020   Malignant melanoma (HCC) 12/2012   right dorsal forearm excised   Myocardial infarction [facility])    LAST 2014   Osteoarthritis of knee    Other specified complication of vascular prosthetic devices, implants and grafts, initial encounter (HCC) 08/22/2021   Pacemaker 10/10/2006   Pancreatitis 11/22/2019   Pneumonia    2016   Pre-diabetes    Psoriasis    Rib fracture 1997   due to fall   Sleep apnea    CPAP   Stroke [facility])    Venous incompetence     Past Surgical History:  Procedure Laterality Date   ABDOMINAL AORTIC ANEURYSM REPAIR  06/03/2007   [facility]; Dr. Hart [place]   ANGIOPLASTY  1994   [place]   BLADDER TUMOR EXCISION  12/1995   CAROTID PTA/STENT INTERVENTION Left 07/23/2022   Procedure: CAROTID PTA/STENT INTERVENTION;  Surgeon: Renford Dills, MD;  Location:  [facility] INVASIVE CV LAB;  Service: Cardiovascular;  Laterality: Left;   CATARACT EXTRACTION W/PHACO Left 10/22/2016   Procedure: CATARACT EXTRACTION PHACO AND INTRAOCULAR LENS PLACEMENT (IOC);  Surgeon: Galen Manila, MD;  Location: [facility] ORS;  Service: Ophthalmology;  Laterality: Left;  [place] 47.7 AP% 18.4 CDE 8.78 Fluid pack lot # [id number]   CATARACT EXTRACTION W/PHACO Right 12/10/2016   Procedure: CATARACT EXTRACTION PHACO AND INTRAOCULAR LENS PLACEMENT (IOC);  Surgeon: Galen Manila, MD;  Location: [facility] ORS;  Service: Ophthalmology;  Laterality: Right;  [place] 00:39 AP% 23.3 CDE 9.13 Fluid pack lot # [id number]   CORONARY ANGIOPLASTY     STENTS X 5   CORONARY ARTERY BYPASS GRAFT  09/22/2006   four   ELBOW BURSA SURGERY     DUE TO GOUT   INSERT / REPLACE / REMOVE PACEMAKER     MELANOMA EXCISION  12/2012   Right forearm   PPM GENERATOR CHANGEOUT N/A 02/13/2023   Procedure: PPM GENERATOR CHANGEOUT;  Surgeon: Marcina Millard, MD;  Location: [facility] INVASIVE CV LAB;  Service: Cardiovascular;  Laterality: N/A;    Family History  Problem Relation Age of Onset   Cancer Mother        Melanoma skin cancer   Heart attack Father 65   Cancer Father        throat  cancer   Arthritis Brother    Social History:  reports that he quit smoking about 23 years ago. His smoking use included cigarettes. He started smoking about 53 years ago. He has a 30 pack-year smoking history. He has never used smokeless tobacco. He reports that he does not drink alcohol and does not use drugs.  Allergies:  Allergies  Allergen Reactions   Amlodipine Besylate Swelling    Had a reaction when taking with colcrys    Rocephin [Ceftriaxone]     unknown   Crestor [Rosuvastatin]     Muscle cramps and pain. Tolerates atorvastatin    (Not in a hospital admission)   No results found for this or any previous visit (from the past 48 hour(s)). No results found.   Blood pressure 103/66, pulse 87, SpO2 98%.  PHYSICAL  EXAM: Appearance _ awake alert with no distress.  Head- atraumatic and no obvious abnormalities Eyes- PERRL, EOMI, no stepoff no conjunctiva injection or ecchymosis Ears-  Right- Pinna without inflammation or swelling. canal without obstruction or injury. TM within normal limits  Left- Pinna without inflammation or swelling. canal without obstruction or injury. TM within normal limits Nose- slight deviation to the right or just still just swelling. Septum slightly deviated to the right. No hematoma. no lesions or masses.  Oc/OP- no malocclusionno lesions or excessive swelling. Mouth opening normal.  Hp/Larynx- normal voice and no airway issues. No swelling or lesions Neck- no mass or lesions. Normal movement.  Neuo- CNII-XII intact, no sensory deficits.  Lungs- normal effort no distress noted     Assessment/Plan Facial fractures- we dicussed the indication of repair of nasal fracture and facial fractures. He has no interest in any repair. He is going to see opthal soon. Follow up as needed  Suzanna Obey 09/04/2023, 8:51 AM

## 2023-09-04 NOTE — Telephone Encounter (Signed)
He can have a same day slot.

## 2023-09-04 NOTE — Telephone Encounter (Signed)
  Chief Complaint: Pain Symptoms: Rib pain S/P fall  Frequency: 08/29/23 Pertinent Negatives: Patient denies  Disposition: [] ED /[] Urgent Care (no appt availability in office) / [x] Appointment(In office/virtual)/ []  [facility]/ [] Home Care/ [] Refused Recommended Disposition /[] [facility]/ []  Follow-up with PCP Additional Notes:   Pt's wife called to secure hospital F/U appt. Sent to triage as wife stated he was in "A lot of pain."  Wife states ribs are bruised, no increased pain, no SOB. Pt seen in ED S/P fall. Reports pain meds are working, taking as ordered. No new pain issues.  Appt was secured for tomorrow prior to triage. Wife states they have multiple appts: saw ENT today, Ortho and Ophthalmologist  Mon.  Requests appt following week, none available. Secured appt for 09/15/23 and placed on wait list, if any availability presents for appt week of 10/14-18 after 12 noon, please notify.   Reason for Disposition  [1] Chest wall swelling, bruise or pain AND [2] present < 7 days  Answer Assessment - Initial Assessment Questions 1. MECHANISM: "How did the injury happen?"     08/29/23, fall 2. ONSET: "When did the injury happen?" (Minutes or hours ago)     08/29/23 3. LOCATION: "Where on the chest is the injury located?"     Bruised ribs  Protocols used: Chest Injury-A-AH

## 2023-09-04 NOTE — Telephone Encounter (Signed)
Appt made for 09/12/2023 at 420

## 2023-09-05 ENCOUNTER — Inpatient Hospital Stay: Payer: Medicare Other | Admitting: Family Medicine

## 2023-09-08 DIAGNOSIS — S52515A Nondisplaced fracture of left radial styloid process, initial encounter for closed fracture: Secondary | ICD-10-CM | POA: Diagnosis not present

## 2023-09-08 DIAGNOSIS — S01409D Unspecified open wound of unspecified cheek and temporomandibular area, subsequent encounter: Secondary | ICD-10-CM | POA: Diagnosis not present

## 2023-09-10 ENCOUNTER — Emergency Department: Payer: Medicare Other

## 2023-09-10 ENCOUNTER — Ambulatory Visit: Payer: Self-pay

## 2023-09-10 ENCOUNTER — Inpatient Hospital Stay
Admission: EM | Admit: 2023-09-10 | Discharge: 2023-09-17 | DRG: 871 | Disposition: A | Discharge status: Skilled Nursing Facility | Payer: Medicare Other | Attending: Internal Medicine | Admitting: Internal Medicine

## 2023-09-10 ENCOUNTER — Other Ambulatory Visit: Payer: Self-pay

## 2023-09-10 ENCOUNTER — Encounter: Payer: Self-pay | Admitting: Emergency Medicine

## 2023-09-10 DIAGNOSIS — Z743 Need for continuous supervision: Secondary | ICD-10-CM | POA: Diagnosis not present

## 2023-09-10 DIAGNOSIS — Z888 Allergy status to other drugs, medicaments and biological substances status: Secondary | ICD-10-CM

## 2023-09-10 DIAGNOSIS — G9341 Metabolic encephalopathy: Secondary | ICD-10-CM | POA: Diagnosis present

## 2023-09-10 DIAGNOSIS — I700 Atherosclerosis of aorta: Secondary | ICD-10-CM | POA: Diagnosis present

## 2023-09-10 DIAGNOSIS — S199XXA Unspecified injury of neck, initial encounter: Secondary | ICD-10-CM | POA: Diagnosis not present

## 2023-09-10 DIAGNOSIS — N170 Acute kidney failure with tubular necrosis: Secondary | ICD-10-CM | POA: Diagnosis not present

## 2023-09-10 DIAGNOSIS — I25790 Atherosclerosis of other coronary artery bypass graft(s) with unstable angina pectoris: Secondary | ICD-10-CM | POA: Diagnosis not present

## 2023-09-10 DIAGNOSIS — N184 Chronic kidney disease, stage 4 (severe): Secondary | ICD-10-CM | POA: Diagnosis not present

## 2023-09-10 DIAGNOSIS — Z8616 Personal history of COVID-19: Secondary | ICD-10-CM

## 2023-09-10 DIAGNOSIS — E7800 Pure hypercholesterolemia, unspecified: Secondary | ICD-10-CM | POA: Diagnosis present

## 2023-09-10 DIAGNOSIS — E871 Hypo-osmolality and hyponatremia: Secondary | ICD-10-CM | POA: Diagnosis present

## 2023-09-10 DIAGNOSIS — I25110 Atherosclerotic heart disease of native coronary artery with unstable angina pectoris: Secondary | ICD-10-CM | POA: Diagnosis present

## 2023-09-10 DIAGNOSIS — K219 Gastro-esophageal reflux disease without esophagitis: Secondary | ICD-10-CM | POA: Diagnosis present

## 2023-09-10 DIAGNOSIS — R6521 Severe sepsis with septic shock: Secondary | ICD-10-CM | POA: Diagnosis not present

## 2023-09-10 DIAGNOSIS — N179 Acute kidney failure, unspecified: Secondary | ICD-10-CM

## 2023-09-10 DIAGNOSIS — E1151 Type 2 diabetes mellitus with diabetic peripheral angiopathy without gangrene: Secondary | ICD-10-CM | POA: Diagnosis present

## 2023-09-10 DIAGNOSIS — R791 Abnormal coagulation profile: Secondary | ICD-10-CM | POA: Diagnosis present

## 2023-09-10 DIAGNOSIS — T68XXXA Hypothermia, initial encounter: Secondary | ICD-10-CM | POA: Diagnosis not present

## 2023-09-10 DIAGNOSIS — Z7984 Long term (current) use of oral hypoglycemic drugs: Secondary | ICD-10-CM

## 2023-09-10 DIAGNOSIS — K767 Hepatorenal syndrome: Secondary | ICD-10-CM | POA: Diagnosis present

## 2023-09-10 DIAGNOSIS — Z8551 Personal history of malignant neoplasm of bladder: Secondary | ICD-10-CM

## 2023-09-10 DIAGNOSIS — I480 Paroxysmal atrial fibrillation: Secondary | ICD-10-CM | POA: Diagnosis present

## 2023-09-10 DIAGNOSIS — Z8249 Family history of ischemic heart disease and other diseases of the circulatory system: Secondary | ICD-10-CM

## 2023-09-10 DIAGNOSIS — I714 Abdominal aortic aneurysm, without rupture, unspecified: Secondary | ICD-10-CM | POA: Diagnosis present

## 2023-09-10 DIAGNOSIS — I959 Hypotension, unspecified: Secondary | ICD-10-CM | POA: Diagnosis not present

## 2023-09-10 DIAGNOSIS — S2242XA Multiple fractures of ribs, left side, initial encounter for closed fracture: Secondary | ICD-10-CM | POA: Diagnosis not present

## 2023-09-10 DIAGNOSIS — I2510 Atherosclerotic heart disease of native coronary artery without angina pectoris: Secondary | ICD-10-CM

## 2023-09-10 DIAGNOSIS — K7031 Alcoholic cirrhosis of liver with ascites: Secondary | ICD-10-CM | POA: Diagnosis present

## 2023-09-10 DIAGNOSIS — R001 Bradycardia, unspecified: Secondary | ICD-10-CM | POA: Diagnosis present

## 2023-09-10 DIAGNOSIS — Z7982 Long term (current) use of aspirin: Secondary | ICD-10-CM

## 2023-09-10 DIAGNOSIS — J449 Chronic obstructive pulmonary disease, unspecified: Secondary | ICD-10-CM | POA: Diagnosis present

## 2023-09-10 DIAGNOSIS — J9601 Acute respiratory failure with hypoxia: Secondary | ICD-10-CM | POA: Diagnosis not present

## 2023-09-10 DIAGNOSIS — I4891 Unspecified atrial fibrillation: Secondary | ICD-10-CM | POA: Diagnosis present

## 2023-09-10 DIAGNOSIS — D6959 Other secondary thrombocytopenia: Secondary | ICD-10-CM | POA: Diagnosis present

## 2023-09-10 DIAGNOSIS — E872 Acidosis, unspecified: Secondary | ICD-10-CM | POA: Diagnosis present

## 2023-09-10 DIAGNOSIS — J189 Pneumonia, unspecified organism: Secondary | ICD-10-CM | POA: Diagnosis present

## 2023-09-10 DIAGNOSIS — G4733 Obstructive sleep apnea (adult) (pediatric): Secondary | ICD-10-CM | POA: Diagnosis present

## 2023-09-10 DIAGNOSIS — Z85820 Personal history of malignant melanoma of skin: Secondary | ICD-10-CM

## 2023-09-10 DIAGNOSIS — L4050 Arthropathic psoriasis, unspecified: Secondary | ICD-10-CM | POA: Diagnosis not present

## 2023-09-10 DIAGNOSIS — Y95 Nosocomial condition: Secondary | ICD-10-CM | POA: Diagnosis present

## 2023-09-10 DIAGNOSIS — Z7401 Bed confinement status: Secondary | ICD-10-CM | POA: Diagnosis not present

## 2023-09-10 DIAGNOSIS — Z9861 Coronary angioplasty status: Secondary | ICD-10-CM | POA: Diagnosis not present

## 2023-09-10 DIAGNOSIS — J324 Chronic pansinusitis: Secondary | ICD-10-CM | POA: Diagnosis present

## 2023-09-10 DIAGNOSIS — M25522 Pain in left elbow: Secondary | ICD-10-CM | POA: Diagnosis not present

## 2023-09-10 DIAGNOSIS — S0990XA Unspecified injury of head, initial encounter: Secondary | ICD-10-CM | POA: Diagnosis not present

## 2023-09-10 DIAGNOSIS — J440 Chronic obstructive pulmonary disease with acute lower respiratory infection: Secondary | ICD-10-CM | POA: Diagnosis not present

## 2023-09-10 DIAGNOSIS — D631 Anemia in chronic kidney disease: Secondary | ICD-10-CM | POA: Diagnosis not present

## 2023-09-10 DIAGNOSIS — I10 Essential (primary) hypertension: Secondary | ICD-10-CM | POA: Diagnosis not present

## 2023-09-10 DIAGNOSIS — I5042 Chronic combined systolic (congestive) and diastolic (congestive) heart failure: Secondary | ICD-10-CM | POA: Diagnosis present

## 2023-09-10 DIAGNOSIS — S022XXA Fracture of nasal bones, initial encounter for closed fracture: Secondary | ICD-10-CM | POA: Diagnosis not present

## 2023-09-10 DIAGNOSIS — Z9981 Dependence on supplemental oxygen: Secondary | ICD-10-CM

## 2023-09-10 DIAGNOSIS — R6889 Other general symptoms and signs: Secondary | ICD-10-CM | POA: Diagnosis not present

## 2023-09-10 DIAGNOSIS — R0789 Other chest pain: Secondary | ICD-10-CM | POA: Diagnosis not present

## 2023-09-10 DIAGNOSIS — I517 Cardiomegaly: Secondary | ICD-10-CM | POA: Diagnosis not present

## 2023-09-10 DIAGNOSIS — I5A Non-ischemic myocardial injury (non-traumatic): Secondary | ICD-10-CM | POA: Diagnosis present

## 2023-09-10 DIAGNOSIS — M6259 Muscle wasting and atrophy, not elsewhere classified, multiple sites: Secondary | ICD-10-CM | POA: Diagnosis not present

## 2023-09-10 DIAGNOSIS — R609 Edema, unspecified: Secondary | ICD-10-CM | POA: Diagnosis not present

## 2023-09-10 DIAGNOSIS — S52513A Displaced fracture of unspecified radial styloid process, initial encounter for closed fracture: Secondary | ICD-10-CM | POA: Diagnosis present

## 2023-09-10 DIAGNOSIS — E878 Other disorders of electrolyte and fluid balance, not elsewhere classified: Secondary | ICD-10-CM | POA: Diagnosis present

## 2023-09-10 DIAGNOSIS — R531 Weakness: Secondary | ICD-10-CM | POA: Diagnosis not present

## 2023-09-10 DIAGNOSIS — K7460 Unspecified cirrhosis of liver: Secondary | ICD-10-CM | POA: Diagnosis present

## 2023-09-10 DIAGNOSIS — L409 Psoriasis, unspecified: Secondary | ICD-10-CM | POA: Diagnosis not present

## 2023-09-10 DIAGNOSIS — I5043 Acute on chronic combined systolic (congestive) and diastolic (congestive) heart failure: Secondary | ICD-10-CM | POA: Diagnosis present

## 2023-09-10 DIAGNOSIS — Z741 Need for assistance with personal care: Secondary | ICD-10-CM | POA: Diagnosis not present

## 2023-09-10 DIAGNOSIS — J90 Pleural effusion, not elsewhere classified: Secondary | ICD-10-CM | POA: Diagnosis not present

## 2023-09-10 DIAGNOSIS — N261 Atrophy of kidney (terminal): Secondary | ICD-10-CM | POA: Diagnosis not present

## 2023-09-10 DIAGNOSIS — I130 Hypertensive heart and chronic kidney disease with heart failure and stage 1 through stage 4 chronic kidney disease, or unspecified chronic kidney disease: Secondary | ICD-10-CM | POA: Diagnosis not present

## 2023-09-10 DIAGNOSIS — E860 Dehydration: Secondary | ICD-10-CM | POA: Diagnosis present

## 2023-09-10 DIAGNOSIS — Z1152 Encounter for screening for COVID-19: Secondary | ICD-10-CM | POA: Diagnosis not present

## 2023-09-10 DIAGNOSIS — Z8679 Personal history of other diseases of the circulatory system: Secondary | ICD-10-CM

## 2023-09-10 DIAGNOSIS — Z87891 Personal history of nicotine dependence: Secondary | ICD-10-CM

## 2023-09-10 DIAGNOSIS — R0602 Shortness of breath: Secondary | ICD-10-CM | POA: Diagnosis not present

## 2023-09-10 DIAGNOSIS — Z8261 Family history of arthritis: Secondary | ICD-10-CM

## 2023-09-10 DIAGNOSIS — Z515 Encounter for palliative care: Secondary | ICD-10-CM | POA: Diagnosis not present

## 2023-09-10 DIAGNOSIS — I083 Combined rheumatic disorders of mitral, aortic and tricuspid valves: Secondary | ICD-10-CM | POA: Diagnosis not present

## 2023-09-10 DIAGNOSIS — J329 Chronic sinusitis, unspecified: Secondary | ICD-10-CM | POA: Diagnosis not present

## 2023-09-10 DIAGNOSIS — I252 Old myocardial infarction: Secondary | ICD-10-CM

## 2023-09-10 DIAGNOSIS — Z8674 Personal history of sudden cardiac arrest: Secondary | ICD-10-CM

## 2023-09-10 DIAGNOSIS — I5023 Acute on chronic systolic (congestive) heart failure: Secondary | ICD-10-CM | POA: Diagnosis not present

## 2023-09-10 DIAGNOSIS — I509 Heart failure, unspecified: Secondary | ICD-10-CM | POA: Diagnosis not present

## 2023-09-10 DIAGNOSIS — I340 Nonrheumatic mitral (valve) insufficiency: Secondary | ICD-10-CM | POA: Diagnosis not present

## 2023-09-10 DIAGNOSIS — E861 Hypovolemia: Secondary | ICD-10-CM | POA: Diagnosis not present

## 2023-09-10 DIAGNOSIS — J9811 Atelectasis: Secondary | ICD-10-CM | POA: Diagnosis present

## 2023-09-10 DIAGNOSIS — Z955 Presence of coronary angioplasty implant and graft: Secondary | ICD-10-CM

## 2023-09-10 DIAGNOSIS — M25521 Pain in right elbow: Secondary | ICD-10-CM | POA: Diagnosis not present

## 2023-09-10 DIAGNOSIS — A419 Sepsis, unspecified organism: Principal | ICD-10-CM | POA: Diagnosis present

## 2023-09-10 DIAGNOSIS — N189 Chronic kidney disease, unspecified: Secondary | ICD-10-CM | POA: Diagnosis not present

## 2023-09-10 DIAGNOSIS — R55 Syncope and collapse: Secondary | ICD-10-CM | POA: Diagnosis not present

## 2023-09-10 DIAGNOSIS — S42001A Fracture of unspecified part of right clavicle, initial encounter for closed fracture: Secondary | ICD-10-CM | POA: Diagnosis not present

## 2023-09-10 DIAGNOSIS — G894 Chronic pain syndrome: Secondary | ICD-10-CM | POA: Diagnosis not present

## 2023-09-10 DIAGNOSIS — R6520 Severe sepsis without septic shock: Secondary | ICD-10-CM

## 2023-09-10 DIAGNOSIS — Z8673 Personal history of transient ischemic attack (TIA), and cerebral infarction without residual deficits: Secondary | ICD-10-CM

## 2023-09-10 DIAGNOSIS — Z808 Family history of malignant neoplasm of other organs or systems: Secondary | ICD-10-CM

## 2023-09-10 DIAGNOSIS — W19XXXA Unspecified fall, initial encounter: Principal | ICD-10-CM | POA: Diagnosis present

## 2023-09-10 DIAGNOSIS — E785 Hyperlipidemia, unspecified: Secondary | ICD-10-CM | POA: Diagnosis not present

## 2023-09-10 DIAGNOSIS — E569 Vitamin deficiency, unspecified: Secondary | ICD-10-CM | POA: Diagnosis not present

## 2023-09-10 DIAGNOSIS — Z7901 Long term (current) use of anticoagulants: Secondary | ICD-10-CM

## 2023-09-10 DIAGNOSIS — E1122 Type 2 diabetes mellitus with diabetic chronic kidney disease: Secondary | ICD-10-CM | POA: Diagnosis not present

## 2023-09-10 DIAGNOSIS — R188 Other ascites: Secondary | ICD-10-CM

## 2023-09-10 DIAGNOSIS — Z66 Do not resuscitate: Secondary | ICD-10-CM | POA: Diagnosis not present

## 2023-09-10 DIAGNOSIS — Z8701 Personal history of pneumonia (recurrent): Secondary | ICD-10-CM

## 2023-09-10 DIAGNOSIS — R2681 Unsteadiness on feet: Secondary | ICD-10-CM | POA: Diagnosis not present

## 2023-09-10 DIAGNOSIS — E875 Hyperkalemia: Secondary | ICD-10-CM | POA: Diagnosis present

## 2023-09-10 DIAGNOSIS — Z95810 Presence of automatic (implantable) cardiac defibrillator: Secondary | ICD-10-CM

## 2023-09-10 DIAGNOSIS — Z951 Presence of aortocoronary bypass graft: Secondary | ICD-10-CM

## 2023-09-10 DIAGNOSIS — R410 Disorientation, unspecified: Secondary | ICD-10-CM | POA: Diagnosis not present

## 2023-09-10 DIAGNOSIS — R296 Repeated falls: Secondary | ICD-10-CM | POA: Diagnosis present

## 2023-09-10 DIAGNOSIS — I495 Sick sinus syndrome: Secondary | ICD-10-CM | POA: Diagnosis present

## 2023-09-10 DIAGNOSIS — I5030 Unspecified diastolic (congestive) heart failure: Secondary | ICD-10-CM | POA: Diagnosis not present

## 2023-09-10 DIAGNOSIS — Z79899 Other long term (current) drug therapy: Secondary | ICD-10-CM

## 2023-09-10 DIAGNOSIS — M109 Gout, unspecified: Secondary | ICD-10-CM | POA: Diagnosis present

## 2023-09-10 DIAGNOSIS — I255 Ischemic cardiomyopathy: Secondary | ICD-10-CM | POA: Diagnosis present

## 2023-09-10 LAB — LACTIC ACID, PLASMA
Lactic Acid, Venous: 3.8 mmol/L (ref 0.5–1.9)
Lactic Acid, Venous: 2.9 mmol/L (ref 0.5–1.9)
Lactic Acid, Venous: 3.4 mmol/L (ref 0.5–1.9)

## 2023-09-10 LAB — CBC WITH DIFFERENTIAL/PLATELET
Abs Immature Granulocytes: 0.1 10*3/uL — ABNORMAL HIGH (ref 0.00–0.07)
Basophils Absolute: 0.1 10*3/uL (ref 0.0–0.1)
Basophils Relative: 1 %
Eosinophils Absolute: 0 10*3/uL (ref 0.0–0.5)
Eosinophils Relative: 0 %
HCT: 40.2 % (ref 39.0–52.0)
Hemoglobin: 12.3 g/dL — ABNORMAL LOW (ref 13.0–17.0)
Immature Granulocytes: 1 %
Lymphocytes Relative: 7 %
Lymphs Abs: 1 10*3/uL (ref 0.7–4.0)
MCH: 30.9 pg (ref 26.0–34.0)
MCHC: 30.6 g/dL (ref 30.0–36.0)
MCV: 101 fL — ABNORMAL HIGH (ref 80.0–100.0)
Monocytes Absolute: 0.7 10*3/uL (ref 0.1–1.0)
Monocytes Relative: 5 %
Neutro Abs: 12 10*3/uL — ABNORMAL HIGH (ref 1.7–7.7)
Neutrophils Relative %: 86 %
Platelets: 133 10*3/uL — ABNORMAL LOW (ref 150–400)
RBC: 3.98 MIL/uL — ABNORMAL LOW (ref 4.22–5.81)
RDW: 17.8 % — ABNORMAL HIGH (ref 11.5–15.5)
WBC: 13.9 10*3/uL — ABNORMAL HIGH (ref 4.0–10.5)
nRBC: 0.1 % (ref 0.0–0.2)

## 2023-09-10 LAB — LIPID PANEL
Cholesterol: 40 mg/dL (ref 0–200)
HDL: 13 mg/dL — ABNORMAL LOW (ref >40)
LDL Cholesterol: 11 mg/dL (ref 0–99)
Total CHOL/HDL Ratio: 3.1 {ratio}
Triglycerides: 79 mg/dL (ref <150)
VLDL: 16 mg/dL (ref 0–40)

## 2023-09-10 LAB — COMPREHENSIVE METABOLIC PANEL
ALT: 10 U/L (ref 0–44)
AST: 28 U/L (ref 15–41)
Albumin: 2.8 g/dL — ABNORMAL LOW (ref 3.5–5.0)
Alkaline Phosphatase: 141 U/L — ABNORMAL HIGH (ref 38–126)
Anion gap: 15 (ref 5–15)
BUN: 93 mg/dL — ABNORMAL HIGH (ref 8–23)
CO2: 25 mmol/L (ref 22–32)
Calcium: 8.9 mg/dL (ref 8.9–10.3)
Chloride: 95 mmol/L — ABNORMAL LOW (ref 98–111)
Creatinine, Ser: 5.46 mg/dL — ABNORMAL HIGH (ref 0.61–1.24)
GFR, Estimated: 10 mL/min — ABNORMAL LOW (ref >60)
Glucose, Bld: 173 mg/dL — ABNORMAL HIGH (ref 70–99)
Potassium: 6.8 mmol/L (ref 3.5–5.1)
Sodium: 135 mmol/L (ref 135–145)
Total Bilirubin: 3.1 mg/dL — ABNORMAL HIGH (ref 0.3–1.2)
Total Protein: 7.9 g/dL (ref 6.5–8.1)

## 2023-09-10 LAB — MAGNESIUM: Magnesium: 2.8 mg/dL — ABNORMAL HIGH (ref 1.7–2.4)

## 2023-09-10 LAB — TSH: TSH: 3.317 u[IU]/mL (ref 0.350–4.500)

## 2023-09-10 LAB — CK: Total CK: 52 U/L (ref 49–397)

## 2023-09-10 LAB — APTT: aPTT: 47 s — ABNORMAL HIGH (ref 24–36)

## 2023-09-10 LAB — BLOOD GAS, VENOUS
Acid-Base Excess: 0.1 mmol/L (ref 0.0–2.0)
Bicarbonate: 27.9 mmol/L (ref 20.0–28.0)
O2 Saturation: 47.6 %
Patient temperature: 37
pCO2, Ven: 58 mm[Hg] (ref 44–60)
pH, Ven: 7.29 (ref 7.25–7.43)
pO2, Ven: 38 mm[Hg] (ref 32–45)

## 2023-09-10 LAB — BRAIN NATRIURETIC PEPTIDE: B Natriuretic Peptide: 647.1 pg/mL — ABNORMAL HIGH (ref 0.0–100.0)

## 2023-09-10 LAB — PHOSPHORUS: Phosphorus: 6.5 mg/dL — ABNORMAL HIGH (ref 2.5–4.6)

## 2023-09-10 LAB — SARS CORONAVIRUS 2 BY RT PCR: SARS Coronavirus 2 by RT PCR: NEGATIVE

## 2023-09-10 LAB — PROTIME-INR
INR: 3 — ABNORMAL HIGH (ref 0.8–1.2)
Prothrombin Time: 31.4 s — ABNORMAL HIGH (ref 11.4–15.2)

## 2023-09-10 LAB — POTASSIUM: Potassium: 6.1 mmol/L — ABNORMAL HIGH (ref 3.5–5.1)

## 2023-09-10 LAB — T4, FREE: Free T4: 1.36 ng/dL — ABNORMAL HIGH (ref 0.61–1.12)

## 2023-09-10 LAB — CBG MONITORING, ED: Glucose-Capillary: 126 mg/dL — ABNORMAL HIGH (ref 70–99)

## 2023-09-10 LAB — TROPONIN I (HIGH SENSITIVITY)
Troponin I (High Sensitivity): 19 ng/L — ABNORMAL HIGH (ref <18)
Troponin I (High Sensitivity): 15 ng/L (ref <18)

## 2023-09-10 LAB — AMMONIA: Ammonia: 42 umol/L — ABNORMAL HIGH (ref 9–35)

## 2023-09-10 LAB — PROCALCITONIN: Procalcitonin: 1.13 ng/mL

## 2023-09-10 LAB — GLUCOSE, CAPILLARY: Glucose-Capillary: 102 mg/dL — ABNORMAL HIGH (ref 70–99)

## 2023-09-10 MED ORDER — FUROSEMIDE 10 MG/ML IJ SOLN
40.0000 mg | Freq: Once | INTRAMUSCULAR | Status: DC
  Filled 2023-09-10: qty 4

## 2023-09-10 MED ORDER — CHLORHEXIDINE GLUCONATE CLOTH 2 % EX PADS
6.0000 | MEDICATED_PAD | Freq: Every day | CUTANEOUS | Status: DC
  Administered 2023-09-10 – 2023-09-16 (×5): 6 via TOPICAL

## 2023-09-10 MED ORDER — ALBUMIN HUMAN 25 % IV SOLN: 12.5000 g | Freq: Once | INTRAVENOUS | Status: DC

## 2023-09-10 MED ORDER — PHENYLEPHRINE HCL-NACL 20-0.9 MG/250ML-% IV SOLN
25.0000 ug/min | INTRAVENOUS | Status: DC
  Administered 2023-09-11: 25 ug/min via INTRAVENOUS
  Filled 2023-09-10 (×2): qty 250

## 2023-09-10 MED ORDER — ACETAMINOPHEN 325 MG PO TABS
650.0000 mg | ORAL_TABLET | Freq: Four times a day (QID) | ORAL | Status: DC | PRN
  Administered 2023-09-13 – 2023-09-15 (×2): 650 mg via ORAL
  Filled 2023-09-10 (×2): qty 2

## 2023-09-10 MED ORDER — PATIROMER SORBITEX CALCIUM 8.4 G PO PACK: 16.8000 g | PACK | Freq: Two times a day (BID) | ORAL | Status: DC

## 2023-09-10 MED ORDER — MIDODRINE HCL 5 MG PO TABS
5.0000 mg | ORAL_TABLET | Freq: Three times a day (TID) | ORAL | Status: DC
  Administered 2023-09-10 – 2023-09-17 (×20): 5 mg via ORAL
  Filled 2023-09-10 (×20): qty 1

## 2023-09-10 MED ORDER — LEVOFLOXACIN IN D5W 750 MG/150ML IV SOLN
750.0000 mg | Freq: Once | INTRAVENOUS | Status: AC
  Administered 2023-09-10: 750 mg via INTRAVENOUS
  Filled 2023-09-10: qty 150

## 2023-09-10 MED ORDER — SODIUM CHLORIDE 0.9 % IV BOLUS (SEPSIS)
500.0000 mL | Freq: Once | INTRAVENOUS | Status: AC
  Administered 2023-09-10: 500 mL via INTRAVENOUS

## 2023-09-10 MED ORDER — SODIUM BICARBONATE 8.4 % IV SOLN: Freq: Once | INTRAVENOUS | Status: DC

## 2023-09-10 MED ORDER — PATIROMER SORBITEX CALCIUM 8.4 G PO PACK
8.4000 g | PACK | Freq: Two times a day (BID) | ORAL | Status: AC
  Administered 2023-09-10: 8.4 g via ORAL
  Filled 2023-09-10: qty 1

## 2023-09-10 MED ORDER — SODIUM ZIRCONIUM CYCLOSILICATE 10 G PO PACK
10.0000 g | PACK | Freq: Once | ORAL | Status: AC
  Administered 2023-09-10: 10 g via ORAL
  Filled 2023-09-10: qty 1

## 2023-09-10 MED ORDER — LACTULOSE 10 GM/15ML PO SOLN
10.0000 g | Freq: Two times a day (BID) | ORAL | Status: DC
  Administered 2023-09-10 – 2023-09-17 (×12): 10 g via ORAL
  Filled 2023-09-10 (×13): qty 30

## 2023-09-10 MED ORDER — INSULIN ASPART 100 UNIT/ML IJ SOLN
0.0000 [IU] | Freq: Three times a day (TID) | INTRAMUSCULAR | Status: DC
  Administered 2023-09-11 (×2): 1 [IU] via SUBCUTANEOUS
  Administered 2023-09-11: 2 [IU] via SUBCUTANEOUS
  Administered 2023-09-12 (×3): 1 [IU] via SUBCUTANEOUS
  Administered 2023-09-13: 2 [IU] via SUBCUTANEOUS
  Administered 2023-09-13 – 2023-09-14 (×3): 1 [IU] via SUBCUTANEOUS
  Administered 2023-09-15: 2 [IU] via SUBCUTANEOUS
  Administered 2023-09-16: 1 [IU] via SUBCUTANEOUS
  Administered 2023-09-17: 2 [IU] via SUBCUTANEOUS
  Filled 2023-09-10 (×13): qty 1

## 2023-09-10 MED ORDER — STERILE WATER FOR INJECTION IV SOLN
INTRAVENOUS | Status: DC
  Filled 2023-09-10: qty 1000

## 2023-09-10 MED ORDER — IPRATROPIUM-ALBUTEROL 0.5-2.5 (3) MG/3ML IN SOLN: 3.0000 mL | RESPIRATORY_TRACT | Status: DC | PRN

## 2023-09-10 MED ORDER — SODIUM CHLORIDE 0.9 % IV BOLUS
1000.0000 mL | Freq: Once | INTRAVENOUS | Status: AC
  Administered 2023-09-10: 500 mL via INTRAVENOUS

## 2023-09-10 MED ORDER — INSULIN ASPART 100 UNIT/ML IV SOLN
5.0000 [IU] | Freq: Once | INTRAVENOUS | Status: AC
  Administered 2023-09-10: 5 [IU] via INTRAVENOUS
  Filled 2023-09-10: qty 0.05

## 2023-09-10 MED ORDER — HYDROCORTISONE SOD SUC (PF) 100 MG IJ SOLR
100.0000 mg | Freq: Once | INTRAMUSCULAR | Status: AC
  Administered 2023-09-10: 100 mg via INTRAVENOUS
  Filled 2023-09-10: qty 2

## 2023-09-10 MED ORDER — STERILE WATER FOR INJECTION IV SOLN
INTRAVENOUS | Status: DC
  Filled 2023-09-10: qty 150

## 2023-09-10 MED ORDER — SODIUM CHLORIDE 0.9% FLUSH
10.0000 mL | Freq: Two times a day (BID) | INTRAVENOUS | Status: DC
  Administered 2023-09-10 – 2023-09-17 (×11): 10 mL via INTRAVENOUS

## 2023-09-10 MED ORDER — DEXTROSE 50 % IV SOLN
1.0000 | Freq: Once | INTRAVENOUS | Status: AC
  Administered 2023-09-10: 50 mL via INTRAVENOUS
  Filled 2023-09-10: qty 50

## 2023-09-10 MED ORDER — DM-GUAIFENESIN ER 30-600 MG PO TB12: 1.0000 | ORAL_TABLET | Freq: Two times a day (BID) | ORAL | Status: DC | PRN

## 2023-09-10 MED ORDER — TRIAMCINOLONE ACETONIDE 0.1 % EX OINT
TOPICAL_OINTMENT | Freq: Two times a day (BID) | CUTANEOUS | Status: DC
  Administered 2023-09-12: 1 via TOPICAL
  Filled 2023-09-10 (×2): qty 15

## 2023-09-10 MED ORDER — INSULIN ASPART 100 UNIT/ML IJ SOLN: 0.0000 [IU] | Freq: Every day | INTRAMUSCULAR | Status: DC

## 2023-09-10 MED ORDER — SODIUM CHLORIDE 0.9 % IV SOLN: 250.0000 mL | INTRAVENOUS | Status: AC

## 2023-09-10 MED ORDER — SODIUM CHLORIDE 0.45 % IV SOLN
INTRAVENOUS | Status: DC
  Filled 2023-09-10: qty 75

## 2023-09-10 MED ORDER — STERILE WATER FOR INJECTION IV SOLN
INTRAVENOUS | Status: DC
  Filled 2023-09-10: qty 150
  Filled 2023-09-10: qty 1000

## 2023-09-10 MED ORDER — ONDANSETRON HCL 4 MG/2ML IJ SOLN: 4.0000 mg | Freq: Three times a day (TID) | INTRAMUSCULAR | Status: DC | PRN

## 2023-09-10 MED ORDER — VANCOMYCIN HCL 1750 MG/350ML IV SOLN
1750.0000 mg | Freq: Once | INTRAVENOUS | Status: AC
  Administered 2023-09-10: 1750 mg via INTRAVENOUS
  Filled 2023-09-10: qty 350

## 2023-09-10 MED ORDER — SODIUM CHLORIDE 0.9 % IV BOLUS
250.0000 mL | Freq: Once | INTRAVENOUS | Status: AC
  Administered 2023-09-10: 250 mL via INTRAVENOUS

## 2023-09-10 MED ORDER — SODIUM BICARBONATE 8.4 % IV SOLN
50.0000 meq | Freq: Once | INTRAVENOUS | Status: AC
  Administered 2023-09-10: 50 meq via INTRAVENOUS
  Filled 2023-09-10: qty 50

## 2023-09-10 MED ORDER — SODIUM CHLORIDE 0.9 % IV SOLN: 1.0000 g | INTRAVENOUS | Status: DC

## 2023-09-10 MED ORDER — CALCIUM GLUCONATE 10 % IV SOLN
1.0000 g | Freq: Once | INTRAVENOUS | Status: AC
  Administered 2023-09-10: 1 g via INTRAVENOUS
  Filled 2023-09-10: qty 10

## 2023-09-10 MED ORDER — VANCOMYCIN VARIABLE DOSE PER UNSTABLE RENAL FUNCTION (PHARMACIST DOSING): Status: DC

## 2023-09-10 MED ORDER — PANTOPRAZOLE SODIUM 40 MG PO TBEC
40.0000 mg | DELAYED_RELEASE_TABLET | Freq: Every day | ORAL | Status: DC
  Administered 2023-09-11 – 2023-09-17 (×7): 40 mg via ORAL
  Filled 2023-09-10 (×7): qty 1

## 2023-09-10 MED ORDER — ALBUTEROL SULFATE (2.5 MG/3ML) 0.083% IN NEBU: 2.5000 mg | INHALATION_SOLUTION | RESPIRATORY_TRACT | Status: DC | PRN

## 2023-09-10 MED ORDER — ALBUMIN HUMAN 25 % IV SOLN
25.0000 g | Freq: Once | INTRAVENOUS | Status: AC
  Administered 2023-09-10: 25 g via INTRAVENOUS
  Filled 2023-09-10: qty 100

## 2023-09-10 NOTE — ED Notes (Signed)
Attempted axillary and oral temp at this time. Unable to obtain temp.

## 2023-09-10 NOTE — ED Notes (Signed)
Pt had BM in bedpan, pt unable to urinate at this time

## 2023-09-10 NOTE — ED Notes (Signed)
Rectal temp obtained. 92.1. MD made aware. Bear hugger applied.

## 2023-09-10 NOTE — Consult Note (Signed)
CODE SEPSIS - PHARMACY COMMUNICATION  **Broad-spectrum antimicrobials should be administered within one hour of sepsis diagnosis**  Time Code Sepsis call or page was received: 1624  Antibiotics ordered: Levofloxacin 750 mg IV x 1  Time of first antibiotic administration: 1701  Additional action taken by pharmacy: N/A  If necessary, name of provider/nurse contacted: N/A    Will M. Dareen Piano, PharmD Clinical Pharmacist 09/10/2023 5:27 PM

## 2023-09-10 NOTE — Progress Notes (Signed)
[facility] is following code sepsis.

## 2023-09-10 NOTE — ED Notes (Signed)
Pt in CT.

## 2023-09-10 NOTE — ED Notes (Signed)
Lab called to process trop and lactic acid that was sent. Clyde Lundborg, MD dc'd ED orders of same then re-ordered them. Lab states they are working on processing same.

## 2023-09-10 NOTE — Telephone Encounter (Signed)
Chief Complaint: Fall Symptoms: felt weak, mild pain  Frequency: today Pertinent Negatives: Patient denies injury,  Disposition: [] ED /[] Urgent Care (no appt availability in office) / [] Appointment(In office/virtual)/ []  [facility]/ [] Home Care/ [] Refused Recommended Disposition /[] [facility]/ []  Follow-up with PCP Additional Notes: Spoke to patient and his wife. Mrs. Matassa stated the patient was in the bathroom around noon when he was blowing his nose and fell face forward in the bathroom, there was a lot of blood due to patient being on a blood thinner so he tried to get in the bath tub to clean up and fell again. His wife took his blood pressure shortly after the fall 77/52, at 1400 107/71. Patient stated he felt a little dizzy before fall. Wife stated he patient has not had much to eat in the past few days and may be weak. Care advice given and patient has a hospital follow-up scheduled 09/12/23 with PCP related to a different fall. Mrs. Virginia Rochester and patient stated they will see Dr. Sherrie Mustache on Friday unless he recommends they come in soon. Will forward to PCP for appointment recommendations.  Reason for Disposition  [1] MODERATE weakness (i.e., interferes with work, school, normal activities) AND [2] new-onset or worsening  Answer Assessment - Initial Assessment Questions 1. MECHANISM: "How did the fall happen?"     He fell in the bathroom  2. DOMESTIC VIOLENCE AND ELDER ABUSE SCREENING: "Did you fall because someone pushed you or tried to hurt you?" If Yes, ask: "Are you safe now?"     No  3. ONSET: "When did the fall happen?" (e.g., minutes, hours, or days ago)     Today, 12 noon 4. LOCATION: "What part of the body hit the ground?" (e.g., back, buttocks, head, hips, knees, hands, head, stomach)     Face forward 5. INJURY: "Did you hurt (injure) yourself when you fell?" If Yes, ask: "What did you injure? Tell me more about this?" (e.g., body area; type of injury; pain  severity)"     A few scraps on his back, arms and forehead  6. PAIN: "Is there any pain?" If Yes, ask: "How bad is the pain?" (e.g., Scale 1-10; or mild,  moderate, severe)   - NONE (0): No pain   - MILD (1-3): Doesn't interfere with normal activities    - MODERATE (4-7): Interferes with normal activities or awakens from sleep    - SEVERE (8-10): Excruciating pain, unable to do any normal activities      Head and neck pain mild but coming from a different fall 2 weeks ago  7. SIZE: For cuts, bruises, or swelling, ask: "How large is it?" (e.g., inches or centimeters)       A few scraps on his back, arms and forehead  9. OTHER SYMPTOMS: "Do you have any other symptoms?" (e.g., dizziness, fever, weakness; new onset or worsening).      Dizzy  10. CAUSE: "What do you think caused the fall (or falling)?" (e.g., tripped, dizzy spell)       Not eating, maybe weak  Protocols used: Falls and [facility]

## 2023-09-10 NOTE — ED Triage Notes (Signed)
Patient to ED via [facility] from home after a fall x3 today. Family reports an increase in generalized weakness and and confusion. Aox4 at this time. Does take blood thinners. Wears 2L [place] at baseline due to COPD.

## 2023-09-10 NOTE — Progress Notes (Signed)
Pharmacy Antibiotic Note  Taylor [name] is a 82 y.o. male admitted on 09/10/2023 with sepsis without clear source. PMH includess CHF with EF < 20%, CKD-4, HTN, HLD, DM, COPD on 2L O2, CAD with stent and hx of CABG, stroke, A fib on Eliquis, s/p of AICD, melanoma, psoriatic arthritis, pancreatitis . Pharmacy has been consulted for vancomycin and cefepime dosing.  Baseline creatinine appears to be 2-3. Currently scr 5.46. Noted allergy to ceftriaxone - patient reported 10/16 that he does not recall allergy to ceftriaxone. Previous note in 09/2022 seems to suggest same information. Tolerated amoxicillin in the past.   Plan: Cefepime 1 gram every 24 hours  Vancomycin 1750 mg x 1 loading dose. Then, will dose vancomycin per variable dosing given unstable renal function.    Height: 5\' 10"  (177.8 cm) Weight: 68 kg (150 lb) IBW/kg (Calculated) : 73  Temp (24hrs), Avg:92.1 F (33.4 C), Min:92.1 F (33.4 C), Max:92.1 F (33.4 C)  Recent Labs  Lab 09/10/23 1606 09/10/23 1809  WBC 13.9*  --   CREATININE 5.46*  --   LATICACIDVEN 3.8* 2.9*    Estimated Creatinine Clearance: 10.4 mL/min (A) (by C-G formula based on SCr of 5.46 mg/dL (H)).    Allergies  Allergen Reactions   Amlodipine Besylate Swelling    Had a reaction when taking with colcrys    Rocephin [Ceftriaxone]     unknown   Crestor [Rosuvastatin]     Muscle cramps and pain. Tolerates atorvastatin    Antimicrobials this admission: 10/16 levofloxacin x 1 10/16 cefepime >> 10/16 vancomycin >>  Dose adjustments this admission: none  Microbiology results: 10/16 BCx: in process   Thank you for allowing pharmacy to be a part of this patient's care.  Elliot Gurney, PharmD, BCPS Clinical Pharmacist  09/10/2023 8:33 PM

## 2023-09-10 NOTE — Progress Notes (Signed)
eLink Physician-Brief Progress Note Patient Name: HAGOP MCCOLLAM DOB: [birth year] MRN: [id number]   Date of Service  09/10/2023  HPI/Events of Note  82 y.o. male admitted on 09/10/2023 with sepsis- HCAP.multiple falls at home. Hyperkalemia, AKI on CKD, no fractures or bleeding  PMH includess CHF with EF < 20%, CKD-4, HTN, HLD, DM, COPD on 2L O2, CAD with stent and hx of CABG, stroke, A fib on Eliquis, s/p of AICD, melanoma, psoriatic arthritis, pancreatitis .   Camera: On nasal o2. Comfortable. VS stable, on neo synephrine. Hypothermic.   Data: Reviewed LA 3.4, decreasing. Wbc 13 K, Hg 12. Scans reviewed. INR 3 TSH normal Cr 5.4trop 19 CT abd/pelvis pending to report Covid neg EKG:  AICD, wide complex.    eICU Interventions  Continue current care plan. On abx. On ssi. On bicarb drip. Full code.      Intervention Category Major Interventions: Respiratory failure - evaluation and management Evaluation Type: New Patient Evaluation  Ranee Gosselin 09/10/2023, 11:03 PM

## 2023-09-10 NOTE — ED Notes (Signed)
Pt in CT.

## 2023-09-10 NOTE — Progress Notes (Signed)
PHARMACY CONSULT NOTE - ELECTROLYTES  Pharmacy Consult for Electrolyte Monitoring and Replacement   Recent Labs: Height: 5\' 10"  (177.8 cm) Weight: 68 kg (150 lb) IBW/kg (Calculated) : 73 Estimated Creatinine Clearance: 10.4 mL/min (A) (by C-G formula based on SCr of 5.46 mg/dL (H)). Potassium (mmol/L)  Date Value  09/10/2023 6.1 (H)   Magnesium (mg/dL)  Date Value  09/81/1914 2.8 (H)   Calcium (mg/dL)  Date Value  78/29/5621 8.9   Albumin (g/dL)  Date Value  30/86/5784 2.8 (L)  05/05/2023 3.1 (L)   Phosphorus (mg/dL)  Date Value  69/62/9528 6.5 (H)   Sodium (mmol/L)  Date Value  09/10/2023 135  05/05/2023 141   Corrected Ca: 9.9 mg/dL  Assessment  sepsis without clear source  Taylor [name] is a 82 y.o. male presenting with sepsis. PMH significant for CHF with EF < 20%, CKD-4, HTN, HLD, DM, COPD on 2L O2, CAD with stent and hx of CABG, stroke, A fib on Eliquis, s/p of AICD, melanoma, psoriatic arthritis, pancreatitis . Pharmacy has been consulted to monitor Hyperkalemia due to worsening renal function.  Diet: PO MIVF: Sodium bicarb 150 mEq @ 75 mL/hr Pertinent medications: None  Goal of Therapy: Electrolytes WNL  Plan:  Patient's K on admission was 6.8 and is now down to 6.1 s/p insulin aspart 5 units + dextrose, Lokelma 10 g x 1, and calcium gluconate 1 g x 1 After discussion with MD, will give an additional Patiromer 8.4 g x 1 tonight Re-check K at midnight Check BMP with AM labs  Thank you for allowing pharmacy to be a part of this patient's care.  Merryl Hacker, PharmD Clinical Pharmacist 09/10/2023 9:07 PM

## 2023-09-10 NOTE — Consult Note (Signed)
NAME:  Taylor [name], MRN:  [id number], DOB:  08-Oct-1941, LOS: 0 ADMISSION DATE:  09/10/2023, CONSULTATION DATE:  09/10/23 REFERRING MD:  Dr. Clyde Lundborg, CHIEF COMPLAINT:  Fall   History of Present Illness:  82 yo M presenting to [facility] ED from home via EMS for evaluation after multiple falls.  History provided per chart review & bedside patient and spouse report. Patient reports being in his normal state of health until 08/29/23 when he fell in the driveway after what he described to be "tripping over the curb". Of note earlier that week his torsemide was increased back to 60 mg BID from 40 mg BID.  This fall created facial fractures that ENT has been monitoring outpatient.  Patient reports his SBP, which he checks daily, normally runs in the 130's but starting the week of 10/7 dropped into the 100's- 110's/80's. He also reports correlating fatigue, sleeping most of the day and worsening generalized weakness. He has been taking all medication as prescribed during this time, with his last administration on the morning of 10/16. In addition he reports one episode of non-bloody diarrhea, poor PO intake and "spots/floaters" in his vision, however ENT told the spouse this might be from the fall on 10/4. He then fell 3 times on 09/10/23. These he describes as feeling his body "give out", he does not think he lost consciousness however he also has no memory of the entire event. He denies abdominal pain, nausea, vomiting, dysuria, hematuria, increased dyspnea, new cough, fever/chills, blurred vision, headache or light headedness.  ED course: Upon arrival, patient alert and oriented, hypothermic with soft BP. Sepsis protocol initiated and bear hugger placed. Lab work significant for hyperkalemia, AKI on CKD stage IV, hypochloremia, mild Transaminitis, elevated INR & mildly elevated ammonia, elevated BNP, leukocytosis with left shift & lactic acidosis. Imaging concerning for pneumonia, cirrhosis and pleural effusion.  Nephrology consulted by EDP for severe AKI, recommendations given for shifting measures and sodium bicarbonate drip. TRH consulted for admission. Medications given: Levaquin, 1.75 mL IVF bolus, sodium bicarb/ Ca+ gluc/ [place]/ Lokelma, solu cortef, midodrine, albumin Initial Vitals: 92.1, 18, 59, 95/65 & 100% on RA Significant labs: (Labs/ Imaging personally reviewed) I, Cheryll Cockayne Rust-Chester, AGACNP-BC, personally viewed and interpreted this ECG. EKG Interpretation: Date: 09/10/23, EKG Time: 20:58, Rate: 75, Rhythm: A-fib, QRS Axis:  LAD, Intervals: BBB, prolonged Qtc, ST/T Wave abnormalities: none, Narrative Interpretation: A-fib with BBB and prolonged Qtc (chronic PPM/ICD) Chemistry: Na+:135, K+: 6.8 > 6.1, BUN/Cr.: 93/ 5.46, Serum CO2/ AG: 25/ 15, Ph: 6.5, Mg: 2.8, Alk Phos: 141, Albumin: 2.8, T. Bili: 3.1 Hematology: WBC: 13.9, Hgb: 12.3,  Troponin: 19 > 15, BNP: 647.1, Lactic/ PCT: 3.8 > 2.9/ 1.13,  COVID-19 & Influenza A/B: pending  VBG: 7.29/ 58/ 38/ 27.9 CXR 09/10/23: No acute findings. Unchanged cardiomegaly post CABG. Improvement in right pleural effusion from prior exam. DG elbow RIGHT 09/10/23: No acute fracture or dislocation of the right elbow. 2. Mild posterior soft tissue edema DG elbow LEFT 09/10/23: No fracture or dislocation of the left elbow. Mild degenerative spurring. CT maxillofacial wo contrast 09/10/23: Similar recent nasal bone fractures. No evidence of interval facial fracture.  CT head wo contrast 09/10/23: No acute intracranial abnormality.  CT cervical spine ow contrast 09/10/23: No evidence of acute fracture or traumatic malalignment in the cervical spine. CT chest/abdomen/pelvis 09/10/23: New patchy airspace disease in the right middle lobe and left lower lobe compatible with infection. Small to moderate-sized right pleural effusion. Stable moderate cardiomegaly. Stable nodular  liver contour compatible with cirrhosis. Small amount of ascites. Stable 3.3 cm  abdominal aortic aneurysm. Aortic atherosclerosis.  PCCM consulted for assistance in management and monitoring due to sepsis s/t pneumonia with septic shock+/- cardiogenic with potential need for peripheral vasopressor support.  Pertinent  Medical History  HFrEF - NYHA Class III (LVEF < 20% with global hypokinesis ICM s/p CABG 2007 (V-fib arrest 1994) AAA s/p endovascular repair 2008 CKD Stage IV CVA Bilateral carotid stenosis HTN HLD T2DM COPD on chronic 2 L [place] OSA PPM (2007) with insertion of ICD generator with existing leads (2014) Former tobacco use (quit 23 years ago, 60 pack year smoking history) CAD Atrial Fibrillation on Eliquis  Significant Hospital Events: Including procedures, antibiotic start and stop dates in addition to other pertinent events   09/10/23: Admit to SDU with sepsis s/t pneumonia in the setting of acute on chronic HFrEF and AKI on CKD IV. Nephrology & PCCM consulted. Potential need for peripheral vasopressors.  Interim History / Subjective:  Patient alert and oriented, asymptomatic with BP 87/60's and MAP in 70's. Spouse bedside. Discussed plan of care, all questions and concerns answered at this time.  Objective   Blood pressure (!) 82/59, pulse (!) 59, temperature (!) 92.1 F (33.4 C), temperature source Rectal, resp. rate (!) 21, height 5\' 10"  (1.778 m), weight 68 kg, SpO2 97%.       No intake or output data in the 24 hours ending 09/10/23 2104 Filed Weights   09/10/23 1552  Weight: 68 kg    Examination: General: Adult male, acutely ill, lying in bed & NAD HEENT: MM pink/dry, anicteric, atraumatic, neck supple, chronic psoriasis, helaing trauma to face from fall PTA Neuro: A&O x 4, able to follow commands, PERRL +3, MAE CV: s1s2 irregular, A-fib on monitor, no r/m/g Pulm: Regular, non labored on 2 L [place], breath sounds diminished throughout GI: soft, rounded, non tender, bs x 4 Skin: chronic psoriasis, healing facial abrasions from previous  fall Extremities: warm/dry, pulses + 2 R/P, no edema noted  Resolved Hospital Problem list     Assessment & Plan:  Sepsis due to suspected pneumonia Circulatory Shock multifactorial s/t sepsis +/- hypovolemia, cardiogenic, hepatorenal syndrome Initial interventions/workup included: 1.75 L of NS/LR & Levaquin - Supplemental oxygen as needed, to maintain SpO2 > 88% - aggressive pulmonary hygiene - intermittent CXR and ABG PRN - f/u cultures, trend lactic/ PCT - Daily CBC, monitor WBC/ fever curve - IV antibiotics: cefepime & vancomycin  - conservative IVF hydration due to HFrEF and severe AKI on CKD - Consider vasopressors to maintain MAP< 65: Phenylephrine (due to A-fib) - Persistent hypotension consider stress dose steroids   Acute Renal Failure superimposed on CKD Stage IV suspect pre-renal secondary to hypovolemia & Sepsis Hyperkalemia Baseline Cr: 2.82, Cr on admission:5.46. Patient received shifting measures - f/u K+, consider scheduled Lokelma TID for total of 6 doses - Strict I/O's: alert provider if UOP < 0.5 mL/kg/hr - gentle IVF hydration  - continue sodium bicarbonate drip per nephrology recommendations - Daily BMP, replace electrolytes PRN - Avoid nephrotoxic agents as able, ensure adequate renal perfusion - Nephrology following, appreciate input   Syncope with Multiple Falls IVF resuscitation, treatment for sepsis - continue midodrine - echocardiogram ordered - PT consulted, falls precautions  Chronic HFrEF exacerbation (LVEF < 20%) PAF on Eliquis - controlled PMHx: HFrEF, ICM, PPM with ICD, HTN Troponin stable: 19 > 15, Lipid panel stable, BNP: 647 - Echocardiogram ordered due to syncope - Eliquis held by  primary due to recent falls - Continuous cardiac monitoring  - Daily weights to assess volume status - Hold outpatient regimen at this time due to shock, AKI and overall appearance of dehydration: carvedilol, aldactone, torsemide, Imdur > consider restarting  as patient stabilizes - consider cardiology consultation depending on work up above  Non-Alcoholic Cirrhosis (new nodular live finding this admission) Elevated INR Mildly Elevated Ammonia - Trend hepatic function, daily coag monitoring - started on Lactulose per primary service - avoid hepatotoxic agents, Lipitor held - will need GI follow up  Chronic COPD  OSA PMHx: former tobacco use - Supplemental oxygen as needed, maintain SpO2 > 88% - Continue Bronchodilators as needed - patient 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 I will hold CPAP for now due to recent facial fractures, adjust oxygen requirements with Makena/face mask PRN  Type 2 Diabetes Mellitus Hemoglobin A1C: 6.6 (9/24) - Monitor CBG AC, HS - SSI sensitive dosing, hold outpatient Jardiance - target range while in ICU: 140-180 - follow ICU hyper/hypo-glycemia protocol  Best Practice (right click and "Reselect all SmartList Selections" daily)  Diet/type: Regular consistency (see orders) DVT prophylaxis: SCD GI prophylaxis: PPI Lines: N/A Foley:  N/A Code Status:  full code Last date of multidisciplinary goals of care discussion [09/10/23]  Labs   CBC: Recent Labs  Lab 09/10/23 1606  WBC 13.9*  NEUTROABS 12.0*  HGB 12.3*  HCT 40.2  MCV 101.0*  PLT 133*    Basic Metabolic Panel: Recent Labs  Lab 09/10/23 1606 09/10/23 1801  NA 135  --   K 6.8* 6.1*  CL 95*  --   CO2 25  --   GLUCOSE 173*  --   BUN 93*  --   CREATININE 5.46*  --   CALCIUM 8.9  --   MG 2.8*  --   PHOS 6.5*  --    GFR: Estimated Creatinine Clearance: 10.4 mL/min (A) (by C-G formula based on SCr of 5.46 mg/dL (H)). Recent Labs  Lab 09/10/23 1606 09/10/23 1809  PROCALCITON 1.13  --   WBC 13.9*  --   LATICACIDVEN 3.8* 2.9*    Liver Function Tests: Recent Labs  Lab 09/10/23 1606  AST 28  ALT 10  ALKPHOS 141*  BILITOT 3.1*  PROT  7.9  ALBUMIN 2.8*   No results for input(s): "LIPASE", "AMYLASE" in the last 168 hours. No results for input(s): "AMMONIA" in the last 168 hours.  ABG    Component Value Date/Time   HCO3 27.9 09/10/2023 1601   O2SAT 47.6 09/10/2023 1601     Coagulation Profile: Recent Labs  Lab 09/10/23 1606  INR 3.0*    Cardiac Enzymes: Recent Labs  Lab 09/10/23 1606  CKTOTAL 52    HbA1C: Hemoglobin A1C  Date/Time Value Ref Range Status  08/15/2023 02:04 PM 6.6 (A) 4.0 - 5.6 % Final   Hgb A1c MFr Bld  Date/Time Value Ref Range Status  05/05/2023 02:32 PM 7.1 (H) 4.8 - 5.6 % Final    Comment:             Prediabetes: 5.7 - 6.4          Diabetes: >6.4          Glycemic control for adults with diabetes: <7.0   12/24/2022 09:28 AM 6.5 (H) 4.8 - 5.6 % Final    Comment:    (NOTE) Pre diabetes:  5.7%-6.4%  Diabetes:              >6.4%  Glycemic control for   <7.0% adults with diabetes     CBG: No results for input(s): "GLUCAP" in the last 168 hours.  Review of Systems: Positives in BOLD  Gen: Denies fever, chills, weight change, fatigue, night sweats HEENT: Denies blurred vision, double vision, hearing loss, tinnitus, sinus congestion, rhinorrhea, sore throat, neck stiffness, dysphagia PULM: Denies shortness of breath, cough, sputum production, hemoptysis, wheezing CV: Denies chest pain, edema, orthopnea, paroxysmal nocturnal dyspnea, palpitations GI: Denies abdominal pain, nausea, vomiting, diarrhea, hematochezia, melena, constipation, change in bowel habits GU: Denies dysuria, hematuria, polyuria, oliguria, urethral discharge Endocrine: Denies hot or cold intolerance, polyuria, polyphagia or appetite change Derm: Denies rash, dry skin, scaling or peeling skin change Heme: Denies easy bruising, bleeding, bleeding gums Neuro: Denies headache, numbness, weakness, slurred speech, loss of memory or consciousness  Past Medical History:  He,  has a past medical history  of AAA (abdominal aortic aneurysm) (HCC) (06/03/2007), AICD (automatic cardioverter/defibrillator) present, Arrhythmia, Barrett's esophagus, Bladder cancer (HCC), Bradycardia, CAD (coronary artery disease), CAP (community acquired pneumonia) (11/13/2019), CHF (congestive heart failure) (HCC), Cluster headache, COVID-19 (09/27/2021), DDD (degenerative disc disease), lumbar, Diabetes mellitus without complication (HCC), Dyspnea, Edema, Fracture of skull base (HCC) (1997), GERD (gastroesophageal reflux disease), Gout, History of bladder cancer (12/1995), Hyperlipidemia, Hypertension, Hypocalcemia (04/19/2020), Malignant melanoma (HCC) (12/2012), Myocardial infarction [facility]), Osteoarthritis of knee, Other specified complication of vascular prosthetic devices, implants and grafts, initial encounter (HCC) (08/22/2021), Pacemaker (10/10/2006), Pancreatitis (11/22/2019), Pneumonia, Pre-diabetes, Psoriasis, Rib fracture (1997), Sleep apnea, Stroke (HCC), and Venous incompetence.   Surgical History:   Past Surgical History:  Procedure Laterality Date   ABDOMINAL AORTIC ANEURYSM REPAIR  06/03/2007   [facility]; Dr. Hart [place]   ANGIOPLASTY  1994   [place]   BLADDER TUMOR EXCISION  12/1995   CAROTID PTA/STENT INTERVENTION Left 07/23/2022   Procedure: CAROTID PTA/STENT INTERVENTION;  Surgeon: Renford Dills, MD;  Location: [facility] INVASIVE CV LAB;  Service: Cardiovascular;  Laterality: Left;   CATARACT EXTRACTION W/PHACO Left 10/22/2016   Procedure: CATARACT EXTRACTION PHACO AND INTRAOCULAR LENS PLACEMENT (IOC);  Surgeon: Galen Manila, MD;  Location: [facility] ORS;  Service: Ophthalmology;  Laterality: Left;  [place] 47.7 AP% 18.4 CDE 8.78 Fluid pack lot # [id number]   CATARACT EXTRACTION W/PHACO Right 12/10/2016   Procedure: CATARACT EXTRACTION PHACO AND INTRAOCULAR LENS PLACEMENT (IOC);  Surgeon: Galen Manila, MD;  Location: [facility] ORS;  Service: Ophthalmology;  Laterality: Right;  [place] 00:39 AP% 23.3 CDE  9.13 Fluid pack lot # [id number]   CORONARY ANGIOPLASTY     STENTS X 5   CORONARY ARTERY BYPASS GRAFT  09/22/2006   four   ELBOW BURSA SURGERY     DUE TO GOUT   INSERT / REPLACE / REMOVE PACEMAKER     MELANOMA EXCISION  12/2012   Right forearm   PPM GENERATOR CHANGEOUT N/A 02/13/2023   Procedure: PPM GENERATOR CHANGEOUT;  Surgeon: Marcina Millard, MD;  Location: [facility] INVASIVE CV LAB;  Service: Cardiovascular;  Laterality: N/A;     Social History:   reports that he quit smoking about 23 years ago. His smoking use included cigarettes. He started smoking about 53 years ago. He has a 30 pack-year smoking history. He has never used smokeless tobacco. He reports that he does not drink alcohol and does not use drugs.   Family History:  His family history includes Arthritis in his brother; Cancer in  his father and mother; Heart attack (age of onset: 1) in his father.   Allergies Allergies  Allergen Reactions   Amlodipine Besylate Swelling    Had a reaction when taking with colcrys    Crestor [Rosuvastatin]     Muscle cramps and pain. Tolerates atorvastatin     Home Medications  Prior to Admission medications   Medication Sig Start Date End Date Taking? Authorizing Provider  acetaminophen (TYLENOL) 500 MG tablet Take 500 mg by mouth every 6 (six) hours as needed for mild pain.    [provider]  albuterol (VENTOLIN HFA) 108 (90 Base) MCG/ACT inhaler Inhale 2 puffs into the lungs every 6 (six) hours as needed for wheezing or shortness of breath. 01/03/23   Malva Limes, MD  apixaban (ELIQUIS) 2.5 MG TABS tablet Take 1 tablet (2.5 mg total) by mouth 2 (two) times daily. Patient taking differently: Take 5 mg by mouth 2 (two) times daily. 04/29/23   Arnetha Courser, MD  aspirin 81 MG EC tablet Take 81 mg by mouth daily.     [provider]  atorvastatin (LIPITOR) 40 MG tablet TAKE 1 TABLET(40 MG) BY MOUTH DAILY 05/13/23   Malva Limes, MD  carvedilol (COREG) 6.25 MG  tablet Take 1 tablet (6.25 mg total) by mouth 2 (two) times daily with a meal. Patient taking differently: Take 12.5 mg by mouth 2 (two) times daily with a meal. 02/27/23   Sunnie Nielsen, DO  ipratropium-albuterol (DUONEB) 0.5-2.5 (3) MG/3ML SOLN Inhale 3 mLs into the lungs every 4 (four) hours as needed (wheezing, shob). 04/29/23 08/29/23  Arnetha Courser, MD  isosorbide mononitrate (IMDUR) 30 MG 24 hr tablet TAKE 1/2 TABLET(15 MG) BY MOUTH DAILY 08/18/23   Malva Limes, MD  JARDIANCE 10 MG TABS tablet Take 10 mg by mouth daily.    [provider]  Magnesium 200 MG TABS Take 1 tablet (200 mg total) by mouth in the morning and at bedtime. 11/11/22   Delma Freeze, FNP  pantoprazole (PROTONIX) 40 MG tablet TAKE 1 TABLET BY MOUTH EVERY DAY 11/21/22   Malva Limes, MD  potassium chloride SA (KLOR-CON M) 20 MEQ tablet Take 2 tablets (40 mEq total) by mouth daily. Patient taking differently: Take 20 mEq by mouth daily. 08/15/23   Malva Limes, MD  spironolactone (ALDACTONE) 25 MG tablet Take 1 tablet (25 mg total) by mouth daily. Patient not taking: Reported on 09/04/2023 04/30/23   Arnetha Courser, MD  torsemide (DEMADEX) 20 MG tablet Take 3 tablets (60 mg total) by mouth 2 (two) times daily. Patient taking differently: Take 40 mg by mouth 2 (two) times daily. 07/21/23 10/19/23  Delma Freeze, FNP  traMADol (ULTRAM) 50 MG tablet Take 1 tablet (50 mg total) by mouth every 6 (six) hours as needed. 08/29/23   Cathren Laine, MD  triamcinolone ointment (KENALOG) 0.1 % APPLY TOPICALLY TWICE DAILY AS DIRECTED 07/15/22   Malva Limes, MD     Critical care time: 68 minutes       Cheryll Cockayne Rust-Chester, AGACNP-BC Acute Care Nurse Practitioner [facility]   [phone] / [phone] Please see Amion for details.

## 2023-09-10 NOTE — ED Notes (Signed)
[place] to obtain an axillary or oral temp at this time. Will try a rectal temp.
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
## 2023-09-10 NOTE — H&P (Incomplete)
History and Physical    Taylor [name]:[id number] DOB: July 16, 1941 DOA: 09/10/2023  Referring MD/NP/PA:   PCP: Malva Limes, MD   Patient coming from:  The patient is coming from home.     Chief Complaint: fall  HPI: Taylor [name] is a 82 y.o. male with medical history significant of sCHF with EF < 20%, CKD-4, HTN, HLD, DM, COPD on 2L O2, CAD with stent and hx of CABG, stroke, A fib on Eliquis, s/p of AICD, melanoma, psoriatic arthritis, pancreatitis, liver cirrhosis with ascites (shown by recent CT scan on 08/29/23), who presents with fall.   Per his wife at bedside, recently patient has generalized weakness and fall frequently.  Patient was seen in ED on 08/29/2023 due to fall.  Patient was found to have minimally displaced and comminuted radial styloid fracture. Pt was started on splint to left forearm.  CT of the maxillofacial images on 08/29/23 showed minimally displaced and comminuted radial styloid fracture.   Patient states that 2 days ago he had some diarrhea which has resolved.  Currently denies nausea, vomiting, diarrhea or abdominal pain.  He continues to have generalized weakness.  He fell 3 times at home today. No LOC.  Denies any injury.  Patient denies chest pain, cough, shortness of breath.  Denies symptoms of UTI.  He moves all extremities. No facial droop or slurred speech.  No confusion during the interview when I saw patient in ED.  X-ray of lett wrist on 08/29/23 showed  minimally displaced and comminuted radial styloid fracture. Pt is wearing splint in left forearm.  CT head on 08/29/23 1. No evidence of an acute intracranial abnormality. 2. Left forehead/periorbital hematoma.   CT maxillofacial:on 08/29/23  1. Acute, displaced fractures of the bilateral nasal bones and frontal process of left maxilla. Overlying nasal soft tissue swelling. 2. Left forehead and periorbital hematoma. 3. Pansinusitis (overall severe).   CT cervical spine:on 08/29/23  1. No  evidence of an acute cervical spine fracture. 2. Nonspecific straightening of the expected cervical lordosis. 3. Cervical spondylosis as described. At C6-C7, a posterior disc osteophyte complex contributes to at least moderate spinal canal stenosis. 4. C6-C7 vertebral body ankylosis. 5. Partially imaged right pleural effusion.    CT-ches/abd/pelvis on 08/29/23 1. No acute traumatic findings in the chest, abdomen, or pelvis. 2. Similar small to moderate-sized right pleural effusion with associated basilar atelectasis. 3. Borderline enlarged right lower paratracheal lymph node is nonspecific and may be reactive. 4. Cirrhotic morphology of the liver with moderate volume of abdominopelvic ascites. 5. Additional unchanged ancillary findings as described above.    Data reviewed independently and ED Course: pt was found to have WBC 13.9, lactic acid 3.8 --> 2.8, potassium 6.8, worsening renal function with creatinine 5.46, BUN 93 and GFR 10 (recent baseline creatinine 2.82 and GFR 22 on 10//24), INR 3.0, PTT 47, CK 52, BNP 647, trop 19.  Pt has hypotension with SBP in 80s in ED, hypothermic with temperature 92.1, heart rate 59, RR 14.  VBG with pH 7.29, CO2 58, O2 38.  Pt is admitted PCU as inpt. EDP consulted Dr. Thedore Mins of renal. Dr. Belia Heman and APP, Micheline Rough Rust-Chester of ICU were consulted.   CT-chest/abd/pelvis today 1. New patchy airspace disease in the right middle lobe and left lower lobe compatible with infection. 2. Small to moderate-sized right pleural effusion. 3. Stable moderate cardiomegaly. 4. Stable nodular liver contour compatible with cirrhosis. 5. Small amount of ascites. 6. Stable 3.3 cm abdominal aortic  aneurysm. Recommend follow-up every 3 years. 7. Aortic atherosclerosis.   Aortic Atherosclerosis (ICD10-I70.0   CT scan of head, C-Spin and maxillofacial today: 1. No acute intracranial abnormality. 2. Similar recent nasal bone fractures. No evidence of interval facial  fracture. 3. No evidence of acute fracture or traumatic malalignment in the cervical spine.  CXR today 1. No acute findings. 2. Unchanged cardiomegaly post CABG. Improvement in right pleural effusion from prior exam.    EKG: I have personally reviewed.  Seem to be paced rhythm, QTc 620, and stable baseline   Review of Systems:   General: no fevers, chills, no body weight gain, has fatigue HEENT: no blurry vision, hearing changes or sore throat Respiratory: no dyspnea, coughing, wheezing CV: no chest pain, no palpitations GI: no nausea, vomiting, abdominal pain, diarrhea, constipation GU: no dysuria, burning on urination, increased urinary frequency, hematuria  Ext: no leg edema Neuro: no unilateral weakness, numbness, or tingling, no vision change or hearing loss. Has fall Skin: no rash. Has periorbital bruises MSK: No muscle spasm, no deformity, no limitation of range of movement in spin Heme: No easy bruising.  Travel history: No recent long distant travel.   Allergy:  Allergies  Allergen Reactions   Amlodipine Besylate Swelling    Had a reaction when taking with colcrys    Crestor [Rosuvastatin]     Muscle cramps and pain. Tolerates atorvastatin    Past Medical History:  Diagnosis Date   AAA (abdominal aortic aneurysm) (HCC) 06/03/2007   [facility]; Dr. Hart Rochester   AICD (automatic cardioverter/defibrillator) present    Arrhythmia    atrial fibrillation   Barrett's esophagus    Bladder cancer (HCC)    Bradycardia    CAD (coronary artery disease)    CAP (community acquired pneumonia) 11/13/2019   CHF (congestive heart failure) (HCC)    Cluster headache    COVID-19 09/27/2021   DDD (degenerative disc disease), lumbar    Diabetes mellitus without complication (HCC)    Dyspnea    WITH EXERTION   Edema    LEFT ANKLE   Fracture of skull base (HCC) 1997   due to fall   GERD (gastroesophageal reflux disease)    Gout    History of bladder cancer  12/1995   Hyperlipidemia    Hypertension    Hypocalcemia 04/19/2020   Possibly secondary to diuretics.   Low=5.9 04/19/2020   Malignant melanoma (HCC) 12/2012   right dorsal forearm excised   Myocardial infarction [facility])    LAST 2014   Osteoarthritis of knee    Other specified complication of vascular prosthetic devices, implants and grafts, initial encounter (HCC) 08/22/2021   Pacemaker 10/10/2006   Pancreatitis 11/22/2019   Pneumonia    2016   Pre-diabetes    Psoriasis    Rib fracture 1997   due to fall   Sleep apnea    CPAP   Stroke [facility])    Venous incompetence     Past Surgical History:  Procedure Laterality Date   ABDOMINAL AORTIC ANEURYSM REPAIR  06/03/2007   [facility]; Dr. Hart Rochester   ANGIOPLASTY  1994   MI   BLADDER TUMOR EXCISION  12/1995   CAROTID PTA/STENT INTERVENTION Left 07/23/2022   Procedure: CAROTID PTA/STENT INTERVENTION;  Surgeon: Renford Dills, MD;  Location: [facility] INVASIVE CV LAB;  Service: Cardiovascular;  Laterality: Left;   CATARACT EXTRACTION W/PHACO Left 10/22/2016   Procedure: CATARACT EXTRACTION PHACO AND INTRAOCULAR LENS PLACEMENT (IOC);  Surgeon: Galen Manila,  MD;  Location: [facility] ORS;  Service: Ophthalmology;  Laterality: Left;  [place] 47.7 AP% 18.4 CDE 8.78 Fluid pack lot # [id number]   CATARACT EXTRACTION W/PHACO Right 12/10/2016   Procedure: CATARACT EXTRACTION PHACO AND INTRAOCULAR LENS PLACEMENT (IOC);  Surgeon: Galen Manila, MD;  Location: [facility] ORS;  Service: Ophthalmology;  Laterality: Right;  [place] 00:39 AP% 23.3 CDE 9.13 Fluid pack lot # [id number]   CORONARY ANGIOPLASTY     STENTS X 5   CORONARY ARTERY BYPASS GRAFT  09/22/2006   four   ELBOW BURSA SURGERY     DUE TO GOUT   INSERT / REPLACE / REMOVE PACEMAKER     MELANOMA EXCISION  12/2012   Right forearm   PPM GENERATOR CHANGEOUT N/A 02/13/2023   Procedure: PPM GENERATOR CHANGEOUT;  Surgeon: Marcina Millard, MD;  Location: [facility] INVASIVE CV LAB;  Service:  Cardiovascular;  Laterality: N/A;    Social History:  reports that he quit smoking about 23 years ago. His smoking use included cigarettes. He started smoking about 53 years ago. He has a 30 pack-year smoking history. He has never used smokeless tobacco. He reports that he does not drink alcohol and does not use drugs.  Family History:  Family History  Problem Relation Age of Onset   Cancer Mother        Melanoma skin cancer   Heart attack Father 51   Cancer Father        throat cancer   Arthritis Brother      Prior to Admission medications   Medication Sig Start Date End Date Taking? Authorizing Provider  acetaminophen (TYLENOL) 500 MG tablet Take 500 mg by mouth every 6 (six) hours as needed for mild pain.    [provider]  albuterol (VENTOLIN HFA) 108 (90 Base) MCG/ACT inhaler Inhale 2 puffs into the lungs every 6 (six) hours as needed for wheezing or shortness of breath. 01/03/23   Malva Limes, MD  apixaban (ELIQUIS) 2.5 MG TABS tablet Take 1 tablet (2.5 mg total) by mouth 2 (two) times daily. Patient taking differently: Take 5 mg by mouth 2 (two) times daily. 04/29/23   Arnetha Courser, MD  aspirin 81 MG EC tablet Take 81 mg by mouth daily.     [provider]  atorvastatin (LIPITOR) 40 MG tablet TAKE 1 TABLET(40 MG) BY MOUTH DAILY 05/13/23   Malva Limes, MD  carvedilol (COREG) 6.25 MG tablet Take 1 tablet (6.25 mg total) by mouth 2 (two) times daily with a meal. Patient taking differently: Take 12.5 mg by mouth 2 (two) times daily with a meal. 02/27/23   Sunnie Nielsen, DO  ipratropium-albuterol (DUONEB) 0.5-2.5 (3) MG/3ML SOLN Inhale 3 mLs into the lungs every 4 (four) hours as needed (wheezing, shob). 04/29/23 08/29/23  Arnetha Courser, MD  isosorbide mononitrate (IMDUR) 30 MG 24 hr tablet TAKE 1/2 TABLET(15 MG) BY MOUTH DAILY 08/18/23   Malva Limes, MD  JARDIANCE 10 MG TABS tablet Take 10 mg by mouth daily.    [provider]  Magnesium 200 MG  TABS Take 1 tablet (200 mg total) by mouth in the morning and at bedtime. 11/11/22   Delma Freeze, FNP  pantoprazole (PROTONIX) 40 MG tablet TAKE 1 TABLET BY MOUTH EVERY DAY 11/21/22   Malva Limes, MD  potassium chloride SA (KLOR-CON M) 20 MEQ tablet Take 2 tablets (40 mEq total) by mouth daily. Patient taking differently: Take 20 mEq by mouth daily. 08/15/23   Mila Merry  E, MD  spironolactone (ALDACTONE) 25 MG tablet Take 1 tablet (25 mg total) by mouth daily. Patient not taking: Reported on 09/04/2023 04/30/23   Arnetha Courser, MD  torsemide (DEMADEX) 20 MG tablet Take 3 tablets (60 mg total) by mouth 2 (two) times daily. Patient taking differently: Take 40 mg by mouth 2 (two) times daily. 07/21/23 10/19/23  Delma Freeze, FNP  traMADol (ULTRAM) 50 MG tablet Take 1 tablet (50 mg total) by mouth every 6 (six) hours as needed. 08/29/23   Cathren Laine, MD  triamcinolone ointment (KENALOG) 0.1 % APPLY TOPICALLY TWICE DAILY AS DIRECTED 07/15/22   Malva Limes, MD    Physical Exam: Vitals:   09/10/23 2107 09/10/23 2115 09/10/23 2200 09/10/23 2215  BP:  91/65 (!) 87/64 (!) 87/63  Pulse:  67 62 64  Resp:  17 17 17   Temp: (!) 94.1 F (34.5 C)     TempSrc: Rectal     SpO2:  99% 100% 100%  Weight:      Height:       General: Not in acute distress.  Dry mucous membrane HEENT:       Eyes: PERRL, EOMI, no jaundice       ENT: No discharge from the ears and nose, no pharynx injection, no tonsillar enlargement.        Neck: No JVD, no bruit, no mass felt. Heme: No neck lymph node enlargement. Cardiac: S1/S2, RRR, No murmurs, No gallops or rubs. Respiratory: No rales, wheezing, rhonchi or rubs. GI: Soft, nondistended, nontender, no rebound pain, no organomegaly, BS present. GU: No hematuria Ext: No pitting leg edema bilaterally. 1+DP/PT pulse bilaterally. Musculoskeletal: No joint deformities, No joint redness or warmth, no limitation of ROM in spin. Skin: Has periorbital  bruits Neuro: Alert, oriented X3, cranial nerves II-XII grossly intact, moves all extremities normally. psych: Patient is not psychotic, no suicidal or hemocidal ideation.  Labs on Admission: I have personally reviewed following labs and imaging studies  CBC: Recent Labs  Lab 09/10/23 1606  WBC 13.9*  NEUTROABS 12.0*  HGB 12.3*  HCT 40.2  MCV 101.0*  PLT 133*   Basic Metabolic Panel: Recent Labs  Lab 09/10/23 1606 09/10/23 1801  NA 135  --   K 6.8* 6.1*  CL 95*  --   CO2 25  --   GLUCOSE 173*  --   BUN 93*  --   CREATININE 5.46*  --   CALCIUM 8.9  --   MG 2.8*  --   PHOS 6.5*  --    GFR: Estimated Creatinine Clearance: 10.4 mL/min (A) (by C-G formula based on SCr of 5.46 mg/dL (H)). Liver Function Tests: Recent Labs  Lab 09/10/23 1606  AST 28  ALT 10  ALKPHOS 141*  BILITOT 3.1*  PROT 7.9  ALBUMIN 2.8*   No results for input(s): "LIPASE", "AMYLASE" in the last 168 hours. Recent Labs  Lab 09/10/23 2138  AMMONIA 42*   Coagulation Profile: Recent Labs  Lab 09/10/23 1606  INR 3.0*   Cardiac Enzymes: Recent Labs  Lab 09/10/23 1606  CKTOTAL 52   BNP (last 3 results) No results for input(s): "PROBNP" in the last 8760 hours. HbA1C: No results for input(s): "HGBA1C" in the last 72 hours. CBG: Recent Labs  Lab 09/10/23 2120  GLUCAP 126*   Lipid Profile: Recent Labs    09/10/23 1606  CHOL 40  HDL 13*  LDLCALC 11  TRIG 79  CHOLHDL 3.1   Thyroid Function Tests: Recent  Labs    09/10/23 1801  TSH 3.317  FREET4 1.36*   Anemia Panel: No results for input(s): "VITAMINB12", "FOLATE", "FERRITIN", "TIBC", "IRON", "RETICCTPCT" in the last 72 hours. Urine analysis:    Component Value Date/Time   COLORURINE YELLOW (A) 02/25/2023 0933   APPEARANCEUR CLEAR (A) 02/25/2023 0933   LABSPEC 1.006 02/25/2023 0933   PHURINE 5.0 02/25/2023 0933   GLUCOSEU 50 (A) 02/25/2023 0933   HGBUR NEGATIVE 02/25/2023 0933   BILIRUBINUR NEGATIVE 02/25/2023 0933    BILIRUBINUR negative 03/31/2018 1452   KETONESUR NEGATIVE 02/25/2023 0933   PROTEINUR NEGATIVE 02/25/2023 0933   UROBILINOGEN 0.2 03/31/2018 1452   NITRITE NEGATIVE 02/25/2023 0933   LEUKOCYTESUR NEGATIVE 02/25/2023 0933   Sepsis Labs: @LABRCNTIP (procalcitonin:4,lacticidven:4) ) Recent Results (from the past 240 hour(s))  Blood culture (routine single)     Status: None (Preliminary result)   Collection Time: 09/10/23  4:06 PM   Specimen: BLOOD  Result Value Ref Range Status   Specimen Description BLOOD LEFT ANTECUBITAL  Final   Special Requests   Final    BOTTLES DRAWN AEROBIC AND ANAEROBIC Blood Culture adequate volume   Culture  Setup Time ANAEROBIC BOTTLE ONLY  Final   Culture   Final    NO ORGANISMS SEEN Performed at [facility], [address] [place], [place] [address]    Report Status PENDING  Incomplete     Radiological Exams on Admission: CT CHEST ABDOMEN PELVIS WO CONTRAST  Result Date: 09/10/2023 CLINICAL DATA:  Sepsis, fall. EXAM: CT CHEST, ABDOMEN AND PELVIS WITHOUT CONTRAST TECHNIQUE: Multidetector CT imaging of the chest, abdomen and pelvis was performed following the standard protocol without IV contrast. RADIATION DOSE REDUCTION: This exam was performed according to the departmental dose-optimization program which includes automated exposure control, adjustment of the mA and/or kV according to patient size and/or use of iterative reconstruction technique. COMPARISON:  CT chest abdomen and pelvis 08/29/2023 FINDINGS: CT CHEST FINDINGS Cardiovascular: The heart is moderately enlarged, unchanged. There is no pericardial effusion. Left-sided pacemaker is present. Aorta is normal in size. There are atherosclerotic calcifications of the aorta. Mediastinum/Nodes: No enlarged mediastinal, hilar, or axillary lymph nodes. Thyroid gland, trachea, and esophagus demonstrate no significant findings. There are calcified left hilar lymph nodes. Lungs/Pleura: There are  calcified pleural plaques bilaterally. Small to moderate-sized right pleural effusion is present. There is new patchy airspace disease in the right middle lobe and left lower lobe with additional ground-glass opacities in the left lower lobe compatible with infection. There is a calcified granuloma in the left lower lobe. Musculoskeletal: Sternotomy wires are present. There are healed left-sided rib fractures. CT ABDOMEN PELVIS FINDINGS Hepatobiliary: Nodular liver contour is again seen. Gallbladder and bile ducts are within normal limits. Pancreas: Unremarkable. No pancreatic ductal dilatation or surrounding inflammatory changes. Spleen: Normal in size without focal abnormality. Calcified granulomas are present. Adrenals/Urinary Tract: Subcentimeter hyperdense lesion in the superior pole the right kidney is unchanged. Right kidney is otherwise within normal limits. There is left renal atrophy, unchanged. The adrenal glands and bladder are within normal limits. Stomach/Bowel: Stomach is within normal limits. No evidence of bowel wall thickening, distention, or inflammatory changes. The appendix is not seen. There is colonic diverticulosis. Duodenal diverticulum is present. Vascular/Lymphatic: There is aneurysmal dilatation of the aorta at the level of the renal arteries measuring 3.3 cm, unchanged. Patient is status post aorto bi-iliac graft. There severe atherosclerotic calcifications throughout the aorta. IVC is normal in size. No enlarged lymph nodes are identified. Reproductive: Prostate  is unremarkable. Other: There is a small right inguinal hernia containing free fluid. There is small amount of ascites throughout the abdomen and pelvis. Musculoskeletal: Degenerative changes affect the spine. IMPRESSION: 1. New patchy airspace disease in the right middle lobe and left lower lobe compatible with infection. 2. Small to moderate-sized right pleural effusion. 3. Stable moderate cardiomegaly. 4. Stable nodular liver  contour compatible with cirrhosis. 5. Small amount of ascites. 6. Stable 3.3 cm abdominal aortic aneurysm. Recommend follow-up every 3 years. 7. Aortic atherosclerosis. Aortic Atherosclerosis (ICD10-I70.0). Electronically Signed   By: Darliss Cheney M.D.   On: 09/10/2023 21:04   CT Head Wo Contrast  Result Date: 09/10/2023 CLINICAL DATA:  Head trauma, minor (Age >= 65y); Facial trauma, blunt; Neck trauma (Age >= 65y) EXAM: CT HEAD WITHOUT CONTRAST CT MAXILLOFACIAL WITHOUT CONTRAST CT CERVICAL SPINE WITHOUT CONTRAST TECHNIQUE: Multidetector CT imaging of the head, cervical spine, and maxillofacial structures were performed using the standard protocol without intravenous contrast. Multiplanar CT image reconstructions of the cervical spine and maxillofacial structures were also generated. RADIATION DOSE REDUCTION: This exam was performed according to the departmental dose-optimization program which includes automated exposure control, adjustment of the mA and/or kV according to patient size and/or use of iterative reconstruction technique. COMPARISON:  CT head, CT cervical spine and CT of the face from October 4, 24. FINDINGS: CT HEAD FINDINGS Brain: Remote left PCA territory infarct. No evidence of acute large vascular territory infarct, acute hemorrhage, mass lesion, midline shift or hydrocephalus. Vascular: No hyperdense vessel. Skull: Similar chronic deformity of the left calvarium. No acute fracture. Other: No mastoid effusions CT MAXILLOFACIAL FINDINGS Osseous: Similar nasal bone fractures. No evidence of interval fracture. TMJs are located. Orbits: Negative. No traumatic or inflammatory finding. Sinuses: Extensive paranasal sinus disease, slightly improved compared to the recent prior. Soft tissues: Decreased size of a left periorbital/forehead contusion compared to recent prior. CT CERVICAL SPINE FINDINGS Alignment: No substantial sagittal subluxation. Skull base and vertebrae: No evidence of acute  fracture. Soft tissues and spinal canal: No prevertebral fluid or swelling. No visible canal hematoma. Disc levels: Similar moderate to severe multilevel degenerative change including facet and uncovertebral hypertrophy with varying degrees of neural foraminal stenosis. Upper chest: Please see same day CT of the chest/abdomen/pelvis for further evaluation. Other: Vascular stent in the left neck. IMPRESSION: 1. No acute intracranial abnormality. 2. Similar recent nasal bone fractures. No evidence of interval facial fracture. 3. No evidence of acute fracture or traumatic malalignment in the cervical spine. Electronically Signed   By: Feliberto Harts M.D.   On: 09/10/2023 19:11   CT Maxillofacial Wo Contrast  Result Date: 09/10/2023 CLINICAL DATA:  Head trauma, minor (Age >= 65y); Facial trauma, blunt; Neck trauma (Age >= 65y) EXAM: CT HEAD WITHOUT CONTRAST CT MAXILLOFACIAL WITHOUT CONTRAST CT CERVICAL SPINE WITHOUT CONTRAST TECHNIQUE: Multidetector CT imaging of the head, cervical spine, and maxillofacial structures were performed using the standard protocol without intravenous contrast. Multiplanar CT image reconstructions of the cervical spine and maxillofacial structures were also generated. RADIATION DOSE REDUCTION: This exam was performed according to the departmental dose-optimization program which includes automated exposure control, adjustment of the mA and/or kV according to patient size and/or use of iterative reconstruction technique. COMPARISON:  CT head, CT cervical spine and CT of the face from October 4, 24. FINDINGS: CT HEAD FINDINGS Brain: Remote left PCA territory infarct. No evidence of acute large vascular territory infarct, acute hemorrhage, mass lesion, midline shift or hydrocephalus. Vascular: No hyperdense vessel. Skull:  Similar chronic deformity of the left calvarium. No acute fracture. Other: No mastoid effusions CT MAXILLOFACIAL FINDINGS Osseous: Similar nasal bone fractures. No  evidence of interval fracture. TMJs are located. Orbits: Negative. No traumatic or inflammatory finding. Sinuses: Extensive paranasal sinus disease, slightly improved compared to the recent prior. Soft tissues: Decreased size of a left periorbital/forehead contusion compared to recent prior. CT CERVICAL SPINE FINDINGS Alignment: No substantial sagittal subluxation. Skull base and vertebrae: No evidence of acute fracture. Soft tissues and spinal canal: No prevertebral fluid or swelling. No visible canal hematoma. Disc levels: Similar moderate to severe multilevel degenerative change including facet and uncovertebral hypertrophy with varying degrees of neural foraminal stenosis. Upper chest: Please see same day CT of the chest/abdomen/pelvis for further evaluation. Other: Vascular stent in the left neck. IMPRESSION: 1. No acute intracranial abnormality. 2. Similar recent nasal bone fractures. No evidence of interval facial fracture. 3. No evidence of acute fracture or traumatic malalignment in the cervical spine. Electronically Signed   By: Feliberto Harts M.D.   On: 09/10/2023 19:11   CT Cervical Spine Wo Contrast  Result Date: 09/10/2023 CLINICAL DATA:  Head trauma, minor (Age >= 65y); Facial trauma, blunt; Neck trauma (Age >= 65y) EXAM: CT HEAD WITHOUT CONTRAST CT MAXILLOFACIAL WITHOUT CONTRAST CT CERVICAL SPINE WITHOUT CONTRAST TECHNIQUE: Multidetector CT imaging of the head, cervical spine, and maxillofacial structures were performed using the standard protocol without intravenous contrast. Multiplanar CT image reconstructions of the cervical spine and maxillofacial structures were also generated. RADIATION DOSE REDUCTION: This exam was performed according to the departmental dose-optimization program which includes automated exposure control, adjustment of the mA and/or kV according to patient size and/or use of iterative reconstruction technique. COMPARISON:  CT head, CT cervical spine and CT of the face  from October 4, 24. FINDINGS: CT HEAD FINDINGS Brain: Remote left PCA territory infarct. No evidence of acute large vascular territory infarct, acute hemorrhage, mass lesion, midline shift or hydrocephalus. Vascular: No hyperdense vessel. Skull: Similar chronic deformity of the left calvarium. No acute fracture. Other: No mastoid effusions CT MAXILLOFACIAL FINDINGS Osseous: Similar nasal bone fractures. No evidence of interval fracture. TMJs are located. Orbits: Negative. No traumatic or inflammatory finding. Sinuses: Extensive paranasal sinus disease, slightly improved compared to the recent prior. Soft tissues: Decreased size of a left periorbital/forehead contusion compared to recent prior. CT CERVICAL SPINE FINDINGS Alignment: No substantial sagittal subluxation. Skull base and vertebrae: No evidence of acute fracture. Soft tissues and spinal canal: No prevertebral fluid or swelling. No visible canal hematoma. Disc levels: Similar moderate to severe multilevel degenerative change including facet and uncovertebral hypertrophy with varying degrees of neural foraminal stenosis. Upper chest: Please see same day CT of the chest/abdomen/pelvis for further evaluation. Other: Vascular stent in the left neck. IMPRESSION: 1. No acute intracranial abnormality. 2. Similar recent nasal bone fractures. No evidence of interval facial fracture. 3. No evidence of acute fracture or traumatic malalignment in the cervical spine. Electronically Signed   By: Feliberto Harts M.D.   On: 09/10/2023 19:11   DG Elbow 2 Views Right  Result Date: 09/10/2023 CLINICAL DATA:  Pain after fall. EXAM: RIGHT ELBOW - 2 VIEW COMPARISON:  None Available. FINDINGS: No acute fracture or dislocation. No definite joint effusion. Corticated density posterior to the olecranon is chronic. Mild degenerative spurring. Mild posterior soft tissue edema. IMPRESSION: 1. No acute fracture or dislocation of the right elbow. 2. Mild posterior soft tissue  edema. Electronically Signed   By: Ivette Loyal.D.  On: 09/10/2023 18:29   DG Chest Port 1 View  Result Date: 09/10/2023 CLINICAL DATA:  Fall today.  Weakness. EXAM: PORTABLE CHEST 1 VIEW COMPARISON:  Radiograph and CT 08/29/2023 FINDINGS: Unchanged cardiomegaly post CABG. Left-sided pacemaker in place. Improvement in right pleural effusion from prior exam. Interstitial thickening is similar. No pneumothorax or acute airspace disease. Remote right clavicle fracture again seen. IMPRESSION: 1. No acute findings. 2. Unchanged cardiomegaly post CABG. Improvement in right pleural effusion from prior exam. Electronically Signed   By: Narda Rutherford M.D.   On: 09/10/2023 18:28   DG Elbow 2 Views Left  Result Date: 09/10/2023 CLINICAL DATA:  Fall.  Pain. EXAM: LEFT ELBOW - 2 VIEW COMPARISON:  None Available. FINDINGS: No fracture or dislocation. No definite joint effusion. Skin folds project over the medial humeral epicondyle. Mild degenerative spurring. IV in the antecubital fossa. IMPRESSION: No fracture or dislocation of the left elbow. Mild degenerative spurring. Electronically Signed   By: Narda Rutherford M.D.   On: 09/10/2023 18:27      Assessment/Plan Principal Problem:   HCAP (healthcare-associated pneumonia) Active Problems:   Severe sepsis (HCC)   Hypotension   Acute renal failure superimposed on stage 4 chronic kidney disease (HCC)   Hypothermia   Hyperkalemia   Fall   CAD S/P percutaneous coronary angioplasty   Chronic combined systolic and diastolic CHF (congestive heart failure) (HCC)   Myocardial injury   Chronic obstructive pulmonary disease, unspecified COPD type (HCC)   Essential (primary) hypertension   Atrial fibrillation (HCC)   History of CVA (cerebrovascular accident)   Liver cirrhosis (HCC)   Hypercholesteremia   Obstructive sleep apnea   Radial styloid fracture_left   Assessment and Plan:  Severe sepsis due to HCAP:   CT of chest/abd/pelvis showed new  patchy airspace disease in the right middle lobe and left lower lobe compatible with infection and small to moderate-sized right pleural effusion. Pt meets criteria for severe sepsis with WBC 13.9, hypothermia with temperature 92.1.  Lactic acid is elevated at 3.8 --> 2.9. Patient has been persistently hypotensive.  His SBP is still in the 80s after giving total of 1.75 cc normal saline, 100 mg of Solu-Cortef, and 5 mg of midodrine. Dr. Belia Heman and APP, Micheline Rough [place] of ICU were consulted  -admit to SDU as inp -Antibiotics, vancomycin and cefepime (patient received 1 dose of Levaquin in ED) -Follow-up of blood culture, sputum culture -IV fluid as above: total of 1.75 L of NS, then 150 mEq of sodium bicarbonate at 75 cc/h  -Trend lactic acid level, and check procalcitonin level -Midodrine 5 mg 3 times daily  -25 g of albumin is ordered.  -check COVID19 and RVP -Check urine antigen of Legionella and strep  Hypotension: Likely multifactorial etiology, including severe seosus, dehydration and continuation for diuretics.  Hepatorenal syndrome is potential differential diagnosis. -Continue midodrine, antibiotics and IV fluid as above -Consulted ICU, Dr. Belia Heman and APP, Micheline [place]  Acute renal failure superimposed on stage 4 chronic kidney disease [facility]):  pt has worsening renal function with creatinine 5.46, BUN 93 and GFR 10 (recent baseline creatinine 2.82 and GFR 22 on 10//24).  Etiology is not clear.  Differential diagnosis include UTI, dehydration and continuation of diuretics.  ATN is possible due to hypotension.  Another potential temporary diagnosis is hepatorenal syndrome.  Consulted with Dr. Thedore Mins of renal, who recommended holding potassium and spironolactone, and bicarb infusion. -IVF: as above -f/u UA -f/u CT-chest/abd/pelvis to make sure there is no obstruction -hold  diuretics ***  Hypothermia -Bair hugger  Hyperkalemia: Potassium 6.8 --> 6.1. -pt was treated with 1 g  calcium gluconate, 5 units of NovoLog, 50 cc of D50, 50 mEq of sodium bicarbonate in ED -IVF as above -will give Patiromer 16.8 g  -consulted pharmacist for electrolytes monitoring  Fall -fall precaution -PT/OT  Hx of CAD S/P percutaneous coronary angioplasty and myocardial injury: trop  19.  Denies chest pain -Trend troponin -Check FLP, A1c -Aspirin, Lipitor ***  Chronic combined systolic and diastolic CHF (congestive heart failure) (HCC): 2D echo on 10/30/2022 showed EF<20%.  Patient has elevated BNP 647, no leg edema or JVD.  Does not seem to have CHF exacerbation -Hold diuretics due to severe sepsis -Watch volume status closely  Chronic obstructive pulmonary disease, unspecified COPD type (HCC) -Bronchodilators, as needed Mucinex  Essential (primary) hypertension -Hold blood pressure medications due to hypotension  Atrial fibrillation (HCC): Heart rate 50-60s -Hold Coreg *** -Hold Eliquis since patient has frequent fall  History of CVA (cerebrovascular accident) -Aspirin, Lipitor  Liver cirrhosis (HCC): ammonia is mildly elevated 42.  Patient is alert, oriented x 3. -Start lactulose 10 g twice daily  Hypercholesteremia -Lipitor  Obstructive sleep apnea -CPAP as toleratred  Recent radial styloid fracture_left:: -on splint -prn tylenol       CRITICAL CARE Performed by: Lorretta Harp   Total critical care time:  70 minutes  Critical care time was exclusive of separately billable procedures and treating other patients.  Critical care was necessary to treat or prevent imminent or life-threatening deterioration.  Critical care was time spent personally by me on the following activities: development of treatment plan with patient and/or surrogate as well as nursing, discussions with consultants, evaluation of patient's response to treatment, examination of patient, obtaining history from patient or surrogate, ordering and performing treatments and interventions,  ordering and review of laboratory studies, ordering and review of radiographic studies, pulse oximetry and re-evaluation of patient's condition.     DVT ppx: SCD   Code Status: Full code   Family Communication:  Yes, patient's wife   at bed side.      Disposition Plan:  Anticipate discharge back to previous environment  Consults called:  Dr. Belia Heman and APP, Micheline Rough [place] of ICU is consulted. EDP consulted Dr. Thedore Mins of renal.   Admission status and Level of care: Stepdown:   as inpt       Dispo: The patient is from: Home              Anticipated d/c is to: Home              Anticipated d/c date is: 2 days              Patient currently is not medically stable to d/c.    Severity of Illness:  The appropriate patient status for this patient is INPATIENT. Inpatient status is judged to be reasonable and necessary in order to provide the required intensity of service to ensure the patient's safety. The patient's presenting symptoms, physical exam findings, and initial radiographic and laboratory data in the context of their chronic comorbidities is felt to place them at high risk for further clinical deterioration. Furthermore, it is not anticipated that the patient will be medically stable for discharge from the hospital within 2 midnights of admission.   * I certify that at the point of admission it is my clinical judgment that the patient will require inpatient hospital care spanning beyond 2 midnights from the  point of admission due to high intensity of service, high risk for further deterioration and high frequency of surveillance required.*       Date of Service 09/10/2023    Lorretta Harp [facility]   If 7PM-7AM, please contact night-coverage [email or web address] 09/10/2023, 10:27 PM

## 2023-09-11 ENCOUNTER — Inpatient Hospital Stay: Payer: Medicare Other

## 2023-09-11 ENCOUNTER — Inpatient Hospital Stay
Admit: 2023-09-11 | Discharge: 2023-09-11 | Disposition: A | Discharge status: Home / Self Care | Payer: Medicare Other | Attending: Pulmonary Disease | Admitting: Pulmonary Disease

## 2023-09-11 DIAGNOSIS — Z515 Encounter for palliative care: Secondary | ICD-10-CM

## 2023-09-11 DIAGNOSIS — E875 Hyperkalemia: Secondary | ICD-10-CM | POA: Diagnosis not present

## 2023-09-11 DIAGNOSIS — I5030 Unspecified diastolic (congestive) heart failure: Secondary | ICD-10-CM

## 2023-09-11 DIAGNOSIS — N179 Acute kidney failure, unspecified: Secondary | ICD-10-CM | POA: Diagnosis not present

## 2023-09-11 DIAGNOSIS — J189 Pneumonia, unspecified organism: Secondary | ICD-10-CM | POA: Diagnosis not present

## 2023-09-11 DIAGNOSIS — I083 Combined rheumatic disorders of mitral, aortic and tricuspid valves: Secondary | ICD-10-CM

## 2023-09-11 DIAGNOSIS — I517 Cardiomegaly: Secondary | ICD-10-CM

## 2023-09-11 DIAGNOSIS — W19XXXA Unspecified fall, initial encounter: Secondary | ICD-10-CM

## 2023-09-11 DIAGNOSIS — I340 Nonrheumatic mitral (valve) insufficiency: Secondary | ICD-10-CM | POA: Diagnosis not present

## 2023-09-11 DIAGNOSIS — R55 Syncope and collapse: Secondary | ICD-10-CM

## 2023-09-11 LAB — RESPIRATORY PANEL BY PCR

## 2023-09-11 LAB — URINALYSIS, W/ REFLEX TO CULTURE (INFECTION SUSPECTED)
Bacteria, UA: NONE SEEN
Bilirubin Urine: NEGATIVE
Glucose, UA: 50 mg/dL — AB
Ketones, ur: NEGATIVE mg/dL
Leukocytes,Ua: NEGATIVE
Nitrite: NEGATIVE
Protein, ur: NEGATIVE mg/dL
Specific Gravity, Urine: 1.015 (ref 1.005–1.030)
pH: 5 (ref 5.0–8.0)

## 2023-09-11 LAB — BASIC METABOLIC PANEL
Anion gap: 13 (ref 5–15)
Anion gap: 13 (ref 5–15)
Anion gap: 16 — ABNORMAL HIGH (ref 5–15)
BUN: 99 mg/dL — ABNORMAL HIGH (ref 8–23)
BUN: 95 mg/dL — ABNORMAL HIGH (ref 8–23)
BUN: 96 mg/dL — ABNORMAL HIGH (ref 8–23)
CO2: 24 mmol/L (ref 22–32)
CO2: 27 mmol/L (ref 22–32)
CO2: 26 mmol/L (ref 22–32)
Calcium: 8.7 mg/dL — ABNORMAL LOW (ref 8.9–10.3)
Calcium: 9.3 mg/dL (ref 8.9–10.3)
Calcium: 9.1 mg/dL (ref 8.9–10.3)
Chloride: 95 mmol/L — ABNORMAL LOW (ref 98–111)
Chloride: 94 mmol/L — ABNORMAL LOW (ref 98–111)
Chloride: 90 mmol/L — ABNORMAL LOW (ref 98–111)
Creatinine, Ser: 4.95 mg/dL — ABNORMAL HIGH (ref 0.61–1.24)
Creatinine, Ser: 5.17 mg/dL — ABNORMAL HIGH (ref 0.61–1.24)
Creatinine, Ser: 5.27 mg/dL — ABNORMAL HIGH (ref 0.61–1.24)
GFR, Estimated: 11 mL/min — ABNORMAL LOW (ref >60)
GFR, Estimated: 11 mL/min — ABNORMAL LOW (ref >60)
GFR, Estimated: 10 mL/min — ABNORMAL LOW (ref >60)
Glucose, Bld: 90 mg/dL (ref 70–99)
Glucose, Bld: 124 mg/dL — ABNORMAL HIGH (ref 70–99)
Glucose, Bld: 174 mg/dL — ABNORMAL HIGH (ref 70–99)
Potassium: 6 mmol/L — ABNORMAL HIGH (ref 3.5–5.1)
Potassium: 5.5 mmol/L — ABNORMAL HIGH (ref 3.5–5.1)
Potassium: 5 mmol/L (ref 3.5–5.1)
Sodium: 132 mmol/L — ABNORMAL LOW (ref 135–145)
Sodium: 134 mmol/L — ABNORMAL LOW (ref 135–145)
Sodium: 132 mmol/L — ABNORMAL LOW (ref 135–145)

## 2023-09-11 LAB — ECHOCARDIOGRAM COMPLETE
AR max vel: 1.2 cm2
AV Area VTI: 1.23 cm2
AV Area mean vel: 1.23 cm2
AV Mean grad: 6 mm[Hg]
AV Peak grad: 12.4 mm[Hg]
Ao pk vel: 1.76 m/s
Area-P 1/2: 3 cm2
Calc EF: 23.9 %
Est EF: <20
Height: 70 in
MV VTI: 1.74 cm2
S' Lateral: 5 cm
Single Plane A2C EF: 34.3 %
Single Plane A4C EF: 17.7 %
Weight: 2532.64 [oz_av]

## 2023-09-11 LAB — BLOOD GAS, VENOUS
Acid-Base Excess: 4.9 mmol/L — ABNORMAL HIGH (ref 0.0–2.0)
Bicarbonate: 29.2 mmol/L — ABNORMAL HIGH (ref 20.0–28.0)
O2 Saturation: 81.4 %
Patient temperature: 37
pCO2, Ven: 41 mm[Hg] — ABNORMAL LOW (ref 44–60)
pH, Ven: 7.46 — ABNORMAL HIGH (ref 7.25–7.43)
pO2, Ven: 53 mm[Hg] — ABNORMAL HIGH (ref 32–45)

## 2023-09-11 LAB — HEPATIC FUNCTION PANEL
ALT: 9 U/L (ref 0–44)
AST: 23 U/L (ref 15–41)
Albumin: 2.8 g/dL — ABNORMAL LOW (ref 3.5–5.0)
Alkaline Phosphatase: 119 U/L (ref 38–126)
Bilirubin, Direct: 1.2 mg/dL — ABNORMAL HIGH (ref 0.0–0.2)
Indirect Bilirubin: 1.4 mg/dL — ABNORMAL HIGH (ref 0.3–0.9)
Total Bilirubin: 2.6 mg/dL — ABNORMAL HIGH (ref 0.3–1.2)
Total Protein: 7.1 g/dL (ref 6.5–8.1)

## 2023-09-11 LAB — CORTISOL: Cortisol, Plasma: 23.2 ug/dL

## 2023-09-11 LAB — CBC
HCT: 35.3 % — ABNORMAL LOW (ref 39.0–52.0)
Hemoglobin: 11 g/dL — ABNORMAL LOW (ref 13.0–17.0)
MCH: 30.8 pg (ref 26.0–34.0)
MCHC: 31.2 g/dL (ref 30.0–36.0)
MCV: 98.9 fL (ref 80.0–100.0)
Platelets: 129 10*3/uL — ABNORMAL LOW (ref 150–400)
RBC: 3.57 MIL/uL — ABNORMAL LOW (ref 4.22–5.81)
RDW: 17.6 % — ABNORMAL HIGH (ref 11.5–15.5)
WBC: 9.7 10*3/uL (ref 4.0–10.5)
nRBC: 0.3 % — ABNORMAL HIGH (ref 0.0–0.2)

## 2023-09-11 LAB — PROTIME-INR
INR: 3 — ABNORMAL HIGH (ref 0.8–1.2)
Prothrombin Time: 31.6 s — ABNORMAL HIGH (ref 11.4–15.2)

## 2023-09-11 LAB — GLUCOSE, CAPILLARY
Glucose-Capillary: 189 mg/dL — ABNORMAL HIGH (ref 70–99)
Glucose-Capillary: 137 mg/dL — ABNORMAL HIGH (ref 70–99)
Glucose-Capillary: 148 mg/dL — ABNORMAL HIGH (ref 70–99)
Glucose-Capillary: 140 mg/dL — ABNORMAL HIGH (ref 70–99)

## 2023-09-11 LAB — LACTIC ACID, PLASMA
Lactic Acid, Venous: 2.5 mmol/L (ref 0.5–1.9)
Lactic Acid, Venous: 2.5 mmol/L (ref 0.5–1.9)
Lactic Acid, Venous: 2.7 mmol/L (ref 0.5–1.9)
Lactic Acid, Venous: 3.4 mmol/L (ref 0.5–1.9)

## 2023-09-11 LAB — PHOSPHORUS: Phosphorus: 5.9 mg/dL — ABNORMAL HIGH (ref 2.5–4.6)

## 2023-09-11 LAB — MRSA NEXT GEN BY PCR, NASAL: MRSA by PCR Next Gen: DETECTED — AB

## 2023-09-11 LAB — HEMOGLOBIN A1C
Hgb A1c MFr Bld: 6.6 % — ABNORMAL HIGH (ref 4.8–5.6)
Mean Plasma Glucose: 142.72 mg/dL

## 2023-09-11 LAB — PROCALCITONIN: Procalcitonin: 1.24 ng/mL

## 2023-09-11 LAB — POTASSIUM: Potassium: 6.2 mmol/L — ABNORMAL HIGH (ref 3.5–5.1)

## 2023-09-11 LAB — STREP PNEUMONIAE URINARY ANTIGEN: Strep Pneumo Urinary Antigen: NEGATIVE

## 2023-09-11 LAB — MAGNESIUM: Magnesium: 2.3 mg/dL (ref 1.7–2.4)

## 2023-09-11 MED ORDER — ASPIRIN 81 MG PO TBEC
81.0000 mg | DELAYED_RELEASE_TABLET | Freq: Every day | ORAL | Status: DC
  Administered 2023-09-11 – 2023-09-17 (×7): 81 mg via ORAL
  Filled 2023-09-11 (×8): qty 1

## 2023-09-11 MED ORDER — MUPIROCIN 2 % EX OINT
1.0000 | TOPICAL_OINTMENT | Freq: Two times a day (BID) | CUTANEOUS | Status: AC
  Administered 2023-09-11 – 2023-09-15 (×10): 1 via NASAL
  Filled 2023-09-11: qty 22

## 2023-09-11 MED ORDER — NEPRO/CARBSTEADY PO LIQD
237.0000 mL | Freq: Three times a day (TID) | ORAL | Status: DC
  Administered 2023-09-11 – 2023-09-17 (×17): 237 mL via ORAL

## 2023-09-11 MED ORDER — DEXTROSE 50 % IV SOLN
1.0000 | Freq: Once | INTRAVENOUS | Status: AC
  Administered 2023-09-11: 50 mL via INTRAVENOUS
  Filled 2023-09-11: qty 50

## 2023-09-11 MED ORDER — INSULIN ASPART 100 UNIT/ML IV SOLN
10.0000 [IU] | Freq: Once | INTRAVENOUS | Status: AC
  Administered 2023-09-11: 10 [IU] via INTRAVENOUS
  Filled 2023-09-11: qty 0.1

## 2023-09-11 MED ORDER — CALCIUM GLUCONATE-NACL 1-0.675 GM/50ML-% IV SOLN
1.0000 g | Freq: Once | INTRAVENOUS | Status: AC
  Administered 2023-09-11: 1000 mg via INTRAVENOUS
  Filled 2023-09-11 (×2): qty 50

## 2023-09-11 MED ORDER — HEPARIN SODIUM (PORCINE) 5000 UNIT/ML IJ SOLN
5000.0000 [IU] | Freq: Three times a day (TID) | INTRAMUSCULAR | Status: DC
  Administered 2023-09-11 – 2023-09-14 (×8): 5000 [IU] via SUBCUTANEOUS
  Filled 2023-09-11 (×9): qty 1

## 2023-09-11 MED ORDER — SODIUM ZIRCONIUM CYCLOSILICATE 5 G PO PACK
10.0000 g | PACK | Freq: Three times a day (TID) | ORAL | Status: AC
  Administered 2023-09-11 – 2023-09-12 (×5): 10 g via ORAL
  Filled 2023-09-11 (×5): qty 2

## 2023-09-11 MED ORDER — ADULT MULTIVITAMIN W/MINERALS CH
1.0000 | ORAL_TABLET | Freq: Every day | ORAL | Status: DC
  Administered 2023-09-12 – 2023-09-17 (×6): 1 via ORAL
  Filled 2023-09-11 (×6): qty 1

## 2023-09-11 MED ORDER — SODIUM BICARBONATE 8.4 % IV SOLN
50.0000 meq | Freq: Once | INTRAVENOUS | Status: AC
  Administered 2023-09-11: 50 meq via INTRAVENOUS
  Filled 2023-09-11: qty 50

## 2023-09-11 NOTE — Plan of Care (Signed)
  Problem: Education: Goal: Knowledge of cardiac device and self-care will improve Outcome: Progressing Goal: Ability to safely manage health related needs after discharge will improve Outcome: Progressing   Problem: Cardiac: Goal: Ability to achieve and maintain adequate cardiopulmonary perfusion will improve Outcome: Progressing   Problem: Education: Goal: Ability to describe self-care measures that may prevent or decrease complications (Diabetes Survival Skills Education) will improve Outcome: Progressing   Problem: Coping: Goal: Ability to adjust to condition or change in health will improve Outcome: Progressing   Problem: Fluid Volume: Goal: Ability to maintain a balanced intake and output will improve Outcome: Progressing   Problem: Metabolic: Goal: Ability to maintain appropriate glucose levels will improve Outcome: Progressing   Problem: Nutritional: Goal: Progress toward achieving an optimal weight will improve Outcome: Progressing   Problem: Skin Integrity: Goal: Risk for impaired skin integrity will decrease Outcome: Progressing

## 2023-09-11 NOTE — Progress Notes (Signed)
at [facility], [address], [place], [place] [address]       Radiology Studies last 3 days: ECHOCARDIOGRAM COMPLETE  Result Date: 09/11/2023    ECHOCARDIOGRAM REPORT   Patient Name:   Taylor [name] Date of Exam: 09/11/2023 Medical Rec #:  [id number]    Height:       70.0 in Accession #:    [id number]   Weight:       158.3 lb Date of Birth:  [birth year]   BSA:          1.890 m Patient Age:    82 years     BP:           103/69 mmHg Patient Gender: M            HR:           62 bpm. Exam Location:  [facility] Procedure: 2D Echo, 3D Echo, Cardiac Doppler and Color Doppler Indications:     Syncope  History:         Patient has prior history of Echocardiogram examinations, most                  recent 10/30/2022. CHF and Cardiomyopathy, CAD and Previous                  Myocardial Infarction, Pacemaker and Defibrillator, Stroke,                  Arrythmias:Atrial Fibrillation and Bradycardia,                  Signs/Symptoms:Syncope and  Dyspnea; Risk Factors:Hypertension                  and Diabetes. AAA, CKD.  Sonographer:     Mikki Harbor Referring Phys:  [id number] BRITTON L RUST-CHESTER Diagnosing Phys: Rozell Searing Custovic IMPRESSIONS  1. Left ventricular ejection fraction, by estimation, is <20%. Left ventricular ejection fraction by 2D MOD biplane is 23.9 %. Left ventricular ejection fraction by PLAX is 20 %. The left ventricle has severely decreased function. The left ventricle demonstrates global hypokinesis. The left ventricular internal cavity size was moderately to severely dilated. There is mild left ventricular hypertrophy. Left ventricular diastolic parameters are consistent with Grade III diastolic dysfunction (restrictive).  2. Severe RV pressure and volume overload. RVSP falsely low due to equalization of pressures. Right ventricular systolic function is severely reduced. The right ventricular size is severely enlarged. There is normal pulmonary artery systolic pressure. The estimated right ventricular systolic pressure is 34.0 mmHg.  3. Left atrial size was severely dilated.  4. Right atrial size was severely dilated.  5. The mitral valve is degenerative. Moderate to severe mitral valve regurgitation.  6. The tricuspid valve is degenerative. Tricuspid valve regurgitation is severe.  7. The aortic valve is grossly normal. Aortic valve regurgitation is mild. Aortic valve sclerosis is present, with no evidence of aortic valve stenosis. FINDINGS  Left Ventricle: Left ventricular ejection fraction, by estimation, is <20%. Left ventricular ejection fraction by PLAX is 20 %. Left ventricular ejection fraction by 2D MOD biplane is 23.9 %. The left ventricle has severely decreased function. The left ventricle demonstrates global hypokinesis. The left ventricular internal cavity size was moderately to severely dilated. There is mild left ventricular hypertrophy. Left ventricular diastolic parameters are consistent with Grade III diastolic  dysfunction  (restrictive). Right Ventricle: Severe RV pressure and volume overload. RVSP falsely low due to equalization of pressures. The right ventricular  PROGRESS NOTE    [name]   ZOX:[id number] DOB: Jul 03, 1941  DOA: 09/10/2023 Date of Service: 09/11/23 which is hospital day 1  PCP: Malva Limes, MD    HPI: GERMAN MANKE is a 82 y.o. male past medical history significant for HFrEF (< 20%), CKD stage IV, hypertension, hyperlipidemia, diabetes, COPD on chronic home 2 L, CAD, history of atrial fibrillation on Eliquis, who presents to ED from home via EMS for fall x3. Multiple falls past month. Concern for low BP. No LOC.   Pertinent recent tx/imaging: 10/04: visit ED due to fall - (+)minimally displaced and comminuted radial styloid fracture --> splint to left forearm. (+)fx nasal bones and L maxilla.   10/10: ENT visit, discuss repair options for facial fx, not interested, conservative mgt.   Hospital course / significant events:  10/16: to ED. (+)sepsis, severe - hypothermic Tmin 30F, low BP 80s/60s, AKI, hyperkalemic K max 6.8 improved w/ tx, Cr on admission 5.46 above baseline 2.7, lactic max 3.8. CT (+)PNA RML and LLL, mod R pleural effusion also noted on previous CT. Tx midodrine, steroids, fluids, vanc/cefepime.  10/17: BP improved, renal fxn and K improved, Echo LVEF <20%, severely decreased fxn and global hypokinesis, grade III diastolic dysfunction, severe RV overload, severe RA and LA dilation. Renal US pending read.   Consultants:  Nephrology PCCU Palliative care  Cardiology, advanced HF team   Procedures/Surgeries: none      ASSESSMENT & PLAN:   Severe sepsis due to HCAP on CT, RML, LLL. POA (+)MRSA screen, pending cultures  Neg COVID and resp viral PCR  meets criteria for severe sepsis with WBC 13.9, hypothermia with temperature 92.1.  Lactic acid is elevated at 3.8 --> 2.9. Patient has been persistently hypotensive. admit to SDU as inp Vancomycin and cefepime initiated 10/16 and patient received 1 dose of Levaquin in ED. MRSA screen (+), continue vancomycin  Blood and sputum cultures, urine antigen of  Legionella and strep IV fluid: total of 1.75 L of NS, then 150 mEq of sodium bicarbonate at 75 cc/h - caution w/ Hx CHF Midodrine Phenylephrine d/c this morning - monitor and low threshold to restart pressors   Acute renal failure superimposed on stage 4 chronic kidney disease Hyperkalemia and hyperphosphatemia likely as result of AKI  creatinine 5.46, BUN 93 and GFR 10 (recent baseline creatinine 2.82 and GFR 22 on 10//24).   Ddx include UTI, dehydration and continuation of diuretics.  ATN is possible due to hypotension. Question hepatorenal syndrome.   Nephrology following  holding potassium and spironolactone bicarb infusion. Hold nephrotoxic rx  Follow UA --> no UTI, (+)hyaline casts, (+)micro hematuria Renal US --> pending read   Hypotension Likely multifactorial - DDx including severe sepsis, dehydration and continuation for diuretics.  Hepatorenal syndrome is potential differential diagnosis. In ED, received solu-cortef, midodrine, albumin  Tx sepsis as above  Midodrine Phenylephrine d/c this morning - monitor and low threshold to restart pressors Consulted ICU --> following Echocardiogram --> severe disease see below    Hypothermia Bair hugger Tx sepsis as above    Hyperkalemia:  Potassium 6.8 --> 6.1. Likely d/t AKI/ATN pt was treated with 1 g calcium gluconate, 5 units of NovoLog, 50 cc of D50, 50 mEq of sodium bicarbonate in ED S/p Patiromer 16.8 g  IVF as above Nephrology following  consulted pharmacist for electrolytes monitoring   Hx of CAD S/P percutaneous coronary angioplasty and myocardial injury:  trop  19 and trended down   Denies chest  PROGRESS NOTE    [name]   ZOX:[id number] DOB: Jul 03, 1941  DOA: 09/10/2023 Date of Service: 09/11/23 which is hospital day 1  PCP: Malva Limes, MD    HPI: GERMAN MANKE is a 82 y.o. male past medical history significant for HFrEF (< 20%), CKD stage IV, hypertension, hyperlipidemia, diabetes, COPD on chronic home 2 L, CAD, history of atrial fibrillation on Eliquis, who presents to ED from home via EMS for fall x3. Multiple falls past month. Concern for low BP. No LOC.   Pertinent recent tx/imaging: 10/04: visit ED due to fall - (+)minimally displaced and comminuted radial styloid fracture --> splint to left forearm. (+)fx nasal bones and L maxilla.   10/10: ENT visit, discuss repair options for facial fx, not interested, conservative mgt.   Hospital course / significant events:  10/16: to ED. (+)sepsis, severe - hypothermic Tmin 30F, low BP 80s/60s, AKI, hyperkalemic K max 6.8 improved w/ tx, Cr on admission 5.46 above baseline 2.7, lactic max 3.8. CT (+)PNA RML and LLL, mod R pleural effusion also noted on previous CT. Tx midodrine, steroids, fluids, vanc/cefepime.  10/17: BP improved, renal fxn and K improved, Echo LVEF <20%, severely decreased fxn and global hypokinesis, grade III diastolic dysfunction, severe RV overload, severe RA and LA dilation. Renal US pending read.   Consultants:  Nephrology PCCU Palliative care  Cardiology, advanced HF team   Procedures/Surgeries: none      ASSESSMENT & PLAN:   Severe sepsis due to HCAP on CT, RML, LLL. POA (+)MRSA screen, pending cultures  Neg COVID and resp viral PCR  meets criteria for severe sepsis with WBC 13.9, hypothermia with temperature 92.1.  Lactic acid is elevated at 3.8 --> 2.9. Patient has been persistently hypotensive. admit to SDU as inp Vancomycin and cefepime initiated 10/16 and patient received 1 dose of Levaquin in ED. MRSA screen (+), continue vancomycin  Blood and sputum cultures, urine antigen of  Legionella and strep IV fluid: total of 1.75 L of NS, then 150 mEq of sodium bicarbonate at 75 cc/h - caution w/ Hx CHF Midodrine Phenylephrine d/c this morning - monitor and low threshold to restart pressors   Acute renal failure superimposed on stage 4 chronic kidney disease Hyperkalemia and hyperphosphatemia likely as result of AKI  creatinine 5.46, BUN 93 and GFR 10 (recent baseline creatinine 2.82 and GFR 22 on 10//24).   Ddx include UTI, dehydration and continuation of diuretics.  ATN is possible due to hypotension. Question hepatorenal syndrome.   Nephrology following  holding potassium and spironolactone bicarb infusion. Hold nephrotoxic rx  Follow UA --> no UTI, (+)hyaline casts, (+)micro hematuria Renal US --> pending read   Hypotension Likely multifactorial - DDx including severe sepsis, dehydration and continuation for diuretics.  Hepatorenal syndrome is potential differential diagnosis. In ED, received solu-cortef, midodrine, albumin  Tx sepsis as above  Midodrine Phenylephrine d/c this morning - monitor and low threshold to restart pressors Consulted ICU --> following Echocardiogram --> severe disease see below    Hypothermia Bair hugger Tx sepsis as above    Hyperkalemia:  Potassium 6.8 --> 6.1. Likely d/t AKI/ATN pt was treated with 1 g calcium gluconate, 5 units of NovoLog, 50 cc of D50, 50 mEq of sodium bicarbonate in ED S/p Patiromer 16.8 g  IVF as above Nephrology following  consulted pharmacist for electrolytes monitoring   Hx of CAD S/P percutaneous coronary angioplasty and myocardial injury:  trop  19 and trended down   Denies chest  PROGRESS NOTE    [name]   ZOX:[id number] DOB: Jul 03, 1941  DOA: 09/10/2023 Date of Service: 09/11/23 which is hospital day 1  PCP: Malva Limes, MD    HPI: GERMAN MANKE is a 82 y.o. male past medical history significant for HFrEF (< 20%), CKD stage IV, hypertension, hyperlipidemia, diabetes, COPD on chronic home 2 L, CAD, history of atrial fibrillation on Eliquis, who presents to ED from home via EMS for fall x3. Multiple falls past month. Concern for low BP. No LOC.   Pertinent recent tx/imaging: 10/04: visit ED due to fall - (+)minimally displaced and comminuted radial styloid fracture --> splint to left forearm. (+)fx nasal bones and L maxilla.   10/10: ENT visit, discuss repair options for facial fx, not interested, conservative mgt.   Hospital course / significant events:  10/16: to ED. (+)sepsis, severe - hypothermic Tmin 30F, low BP 80s/60s, AKI, hyperkalemic K max 6.8 improved w/ tx, Cr on admission 5.46 above baseline 2.7, lactic max 3.8. CT (+)PNA RML and LLL, mod R pleural effusion also noted on previous CT. Tx midodrine, steroids, fluids, vanc/cefepime.  10/17: BP improved, renal fxn and K improved, Echo LVEF <20%, severely decreased fxn and global hypokinesis, grade III diastolic dysfunction, severe RV overload, severe RA and LA dilation. Renal US pending read.   Consultants:  Nephrology PCCU Palliative care  Cardiology, advanced HF team   Procedures/Surgeries: none      ASSESSMENT & PLAN:   Severe sepsis due to HCAP on CT, RML, LLL. POA (+)MRSA screen, pending cultures  Neg COVID and resp viral PCR  meets criteria for severe sepsis with WBC 13.9, hypothermia with temperature 92.1.  Lactic acid is elevated at 3.8 --> 2.9. Patient has been persistently hypotensive. admit to SDU as inp Vancomycin and cefepime initiated 10/16 and patient received 1 dose of Levaquin in ED. MRSA screen (+), continue vancomycin  Blood and sputum cultures, urine antigen of  Legionella and strep IV fluid: total of 1.75 L of NS, then 150 mEq of sodium bicarbonate at 75 cc/h - caution w/ Hx CHF Midodrine Phenylephrine d/c this morning - monitor and low threshold to restart pressors   Acute renal failure superimposed on stage 4 chronic kidney disease Hyperkalemia and hyperphosphatemia likely as result of AKI  creatinine 5.46, BUN 93 and GFR 10 (recent baseline creatinine 2.82 and GFR 22 on 10//24).   Ddx include UTI, dehydration and continuation of diuretics.  ATN is possible due to hypotension. Question hepatorenal syndrome.   Nephrology following  holding potassium and spironolactone bicarb infusion. Hold nephrotoxic rx  Follow UA --> no UTI, (+)hyaline casts, (+)micro hematuria Renal US --> pending read   Hypotension Likely multifactorial - DDx including severe sepsis, dehydration and continuation for diuretics.  Hepatorenal syndrome is potential differential diagnosis. In ED, received solu-cortef, midodrine, albumin  Tx sepsis as above  Midodrine Phenylephrine d/c this morning - monitor and low threshold to restart pressors Consulted ICU --> following Echocardiogram --> severe disease see below    Hypothermia Bair hugger Tx sepsis as above    Hyperkalemia:  Potassium 6.8 --> 6.1. Likely d/t AKI/ATN pt was treated with 1 g calcium gluconate, 5 units of NovoLog, 50 cc of D50, 50 mEq of sodium bicarbonate in ED S/p Patiromer 16.8 g  IVF as above Nephrology following  consulted pharmacist for electrolytes monitoring   Hx of CAD S/P percutaneous coronary angioplasty and myocardial injury:  trop  19 and trended down   Denies chest  PROGRESS NOTE    [name]   ZOX:[id number] DOB: Jul 03, 1941  DOA: 09/10/2023 Date of Service: 09/11/23 which is hospital day 1  PCP: Malva Limes, MD    HPI: GERMAN MANKE is a 82 y.o. male past medical history significant for HFrEF (< 20%), CKD stage IV, hypertension, hyperlipidemia, diabetes, COPD on chronic home 2 L, CAD, history of atrial fibrillation on Eliquis, who presents to ED from home via EMS for fall x3. Multiple falls past month. Concern for low BP. No LOC.   Pertinent recent tx/imaging: 10/04: visit ED due to fall - (+)minimally displaced and comminuted radial styloid fracture --> splint to left forearm. (+)fx nasal bones and L maxilla.   10/10: ENT visit, discuss repair options for facial fx, not interested, conservative mgt.   Hospital course / significant events:  10/16: to ED. (+)sepsis, severe - hypothermic Tmin 30F, low BP 80s/60s, AKI, hyperkalemic K max 6.8 improved w/ tx, Cr on admission 5.46 above baseline 2.7, lactic max 3.8. CT (+)PNA RML and LLL, mod R pleural effusion also noted on previous CT. Tx midodrine, steroids, fluids, vanc/cefepime.  10/17: BP improved, renal fxn and K improved, Echo LVEF <20%, severely decreased fxn and global hypokinesis, grade III diastolic dysfunction, severe RV overload, severe RA and LA dilation. Renal US pending read.   Consultants:  Nephrology PCCU Palliative care  Cardiology, advanced HF team   Procedures/Surgeries: none      ASSESSMENT & PLAN:   Severe sepsis due to HCAP on CT, RML, LLL. POA (+)MRSA screen, pending cultures  Neg COVID and resp viral PCR  meets criteria for severe sepsis with WBC 13.9, hypothermia with temperature 92.1.  Lactic acid is elevated at 3.8 --> 2.9. Patient has been persistently hypotensive. admit to SDU as inp Vancomycin and cefepime initiated 10/16 and patient received 1 dose of Levaquin in ED. MRSA screen (+), continue vancomycin  Blood and sputum cultures, urine antigen of  Legionella and strep IV fluid: total of 1.75 L of NS, then 150 mEq of sodium bicarbonate at 75 cc/h - caution w/ Hx CHF Midodrine Phenylephrine d/c this morning - monitor and low threshold to restart pressors   Acute renal failure superimposed on stage 4 chronic kidney disease Hyperkalemia and hyperphosphatemia likely as result of AKI  creatinine 5.46, BUN 93 and GFR 10 (recent baseline creatinine 2.82 and GFR 22 on 10//24).   Ddx include UTI, dehydration and continuation of diuretics.  ATN is possible due to hypotension. Question hepatorenal syndrome.   Nephrology following  holding potassium and spironolactone bicarb infusion. Hold nephrotoxic rx  Follow UA --> no UTI, (+)hyaline casts, (+)micro hematuria Renal US --> pending read   Hypotension Likely multifactorial - DDx including severe sepsis, dehydration and continuation for diuretics.  Hepatorenal syndrome is potential differential diagnosis. In ED, received solu-cortef, midodrine, albumin  Tx sepsis as above  Midodrine Phenylephrine d/c this morning - monitor and low threshold to restart pressors Consulted ICU --> following Echocardiogram --> severe disease see below    Hypothermia Bair hugger Tx sepsis as above    Hyperkalemia:  Potassium 6.8 --> 6.1. Likely d/t AKI/ATN pt was treated with 1 g calcium gluconate, 5 units of NovoLog, 50 cc of D50, 50 mEq of sodium bicarbonate in ED S/p Patiromer 16.8 g  IVF as above Nephrology following  consulted pharmacist for electrolytes monitoring   Hx of CAD S/P percutaneous coronary angioplasty and myocardial injury:  trop  19 and trended down   Denies chest  at [facility], [address], [place], [place] [address]       Radiology Studies last 3 days: ECHOCARDIOGRAM COMPLETE  Result Date: 09/11/2023    ECHOCARDIOGRAM REPORT   Patient Name:   Taylor [name] Date of Exam: 09/11/2023 Medical Rec #:  [id number]    Height:       70.0 in Accession #:    [id number]   Weight:       158.3 lb Date of Birth:  [birth year]   BSA:          1.890 m Patient Age:    82 years     BP:           103/69 mmHg Patient Gender: M            HR:           62 bpm. Exam Location:  [facility] Procedure: 2D Echo, 3D Echo, Cardiac Doppler and Color Doppler Indications:     Syncope  History:         Patient has prior history of Echocardiogram examinations, most                  recent 10/30/2022. CHF and Cardiomyopathy, CAD and Previous                  Myocardial Infarction, Pacemaker and Defibrillator, Stroke,                  Arrythmias:Atrial Fibrillation and Bradycardia,                  Signs/Symptoms:Syncope and  Dyspnea; Risk Factors:Hypertension                  and Diabetes. AAA, CKD.  Sonographer:     Mikki Harbor Referring Phys:  [id number] BRITTON L RUST-CHESTER Diagnosing Phys: Rozell Searing Custovic IMPRESSIONS  1. Left ventricular ejection fraction, by estimation, is <20%. Left ventricular ejection fraction by 2D MOD biplane is 23.9 %. Left ventricular ejection fraction by PLAX is 20 %. The left ventricle has severely decreased function. The left ventricle demonstrates global hypokinesis. The left ventricular internal cavity size was moderately to severely dilated. There is mild left ventricular hypertrophy. Left ventricular diastolic parameters are consistent with Grade III diastolic dysfunction (restrictive).  2. Severe RV pressure and volume overload. RVSP falsely low due to equalization of pressures. Right ventricular systolic function is severely reduced. The right ventricular size is severely enlarged. There is normal pulmonary artery systolic pressure. The estimated right ventricular systolic pressure is 34.0 mmHg.  3. Left atrial size was severely dilated.  4. Right atrial size was severely dilated.  5. The mitral valve is degenerative. Moderate to severe mitral valve regurgitation.  6. The tricuspid valve is degenerative. Tricuspid valve regurgitation is severe.  7. The aortic valve is grossly normal. Aortic valve regurgitation is mild. Aortic valve sclerosis is present, with no evidence of aortic valve stenosis. FINDINGS  Left Ventricle: Left ventricular ejection fraction, by estimation, is <20%. Left ventricular ejection fraction by PLAX is 20 %. Left ventricular ejection fraction by 2D MOD biplane is 23.9 %. The left ventricle has severely decreased function. The left ventricle demonstrates global hypokinesis. The left ventricular internal cavity size was moderately to severely dilated. There is mild left ventricular hypertrophy. Left ventricular diastolic parameters are consistent with Grade III diastolic  dysfunction  (restrictive). Right Ventricle: Severe RV pressure and volume overload. RVSP falsely low due to equalization of pressures. The right ventricular  PROGRESS NOTE    [name]   ZOX:[id number] DOB: Jul 03, 1941  DOA: 09/10/2023 Date of Service: 09/11/23 which is hospital day 1  PCP: Malva Limes, MD    HPI: GERMAN MANKE is a 82 y.o. male past medical history significant for HFrEF (< 20%), CKD stage IV, hypertension, hyperlipidemia, diabetes, COPD on chronic home 2 L, CAD, history of atrial fibrillation on Eliquis, who presents to ED from home via EMS for fall x3. Multiple falls past month. Concern for low BP. No LOC.   Pertinent recent tx/imaging: 10/04: visit ED due to fall - (+)minimally displaced and comminuted radial styloid fracture --> splint to left forearm. (+)fx nasal bones and L maxilla.   10/10: ENT visit, discuss repair options for facial fx, not interested, conservative mgt.   Hospital course / significant events:  10/16: to ED. (+)sepsis, severe - hypothermic Tmin 30F, low BP 80s/60s, AKI, hyperkalemic K max 6.8 improved w/ tx, Cr on admission 5.46 above baseline 2.7, lactic max 3.8. CT (+)PNA RML and LLL, mod R pleural effusion also noted on previous CT. Tx midodrine, steroids, fluids, vanc/cefepime.  10/17: BP improved, renal fxn and K improved, Echo LVEF <20%, severely decreased fxn and global hypokinesis, grade III diastolic dysfunction, severe RV overload, severe RA and LA dilation. Renal US pending read.   Consultants:  Nephrology PCCU Palliative care  Cardiology, advanced HF team   Procedures/Surgeries: none      ASSESSMENT & PLAN:   Severe sepsis due to HCAP on CT, RML, LLL. POA (+)MRSA screen, pending cultures  Neg COVID and resp viral PCR  meets criteria for severe sepsis with WBC 13.9, hypothermia with temperature 92.1.  Lactic acid is elevated at 3.8 --> 2.9. Patient has been persistently hypotensive. admit to SDU as inp Vancomycin and cefepime initiated 10/16 and patient received 1 dose of Levaquin in ED. MRSA screen (+), continue vancomycin  Blood and sputum cultures, urine antigen of  Legionella and strep IV fluid: total of 1.75 L of NS, then 150 mEq of sodium bicarbonate at 75 cc/h - caution w/ Hx CHF Midodrine Phenylephrine d/c this morning - monitor and low threshold to restart pressors   Acute renal failure superimposed on stage 4 chronic kidney disease Hyperkalemia and hyperphosphatemia likely as result of AKI  creatinine 5.46, BUN 93 and GFR 10 (recent baseline creatinine 2.82 and GFR 22 on 10//24).   Ddx include UTI, dehydration and continuation of diuretics.  ATN is possible due to hypotension. Question hepatorenal syndrome.   Nephrology following  holding potassium and spironolactone bicarb infusion. Hold nephrotoxic rx  Follow UA --> no UTI, (+)hyaline casts, (+)micro hematuria Renal US --> pending read   Hypotension Likely multifactorial - DDx including severe sepsis, dehydration and continuation for diuretics.  Hepatorenal syndrome is potential differential diagnosis. In ED, received solu-cortef, midodrine, albumin  Tx sepsis as above  Midodrine Phenylephrine d/c this morning - monitor and low threshold to restart pressors Consulted ICU --> following Echocardiogram --> severe disease see below    Hypothermia Bair hugger Tx sepsis as above    Hyperkalemia:  Potassium 6.8 --> 6.1. Likely d/t AKI/ATN pt was treated with 1 g calcium gluconate, 5 units of NovoLog, 50 cc of D50, 50 mEq of sodium bicarbonate in ED S/p Patiromer 16.8 g  IVF as above Nephrology following  consulted pharmacist for electrolytes monitoring   Hx of CAD S/P percutaneous coronary angioplasty and myocardial injury:  trop  19 and trended down   Denies chest  at [facility], [address], [place], [place] [address]       Radiology Studies last 3 days: ECHOCARDIOGRAM COMPLETE  Result Date: 09/11/2023    ECHOCARDIOGRAM REPORT   Patient Name:   Taylor [name] Date of Exam: 09/11/2023 Medical Rec #:  [id number]    Height:       70.0 in Accession #:    [id number]   Weight:       158.3 lb Date of Birth:  [birth year]   BSA:          1.890 m Patient Age:    82 years     BP:           103/69 mmHg Patient Gender: M            HR:           62 bpm. Exam Location:  [facility] Procedure: 2D Echo, 3D Echo, Cardiac Doppler and Color Doppler Indications:     Syncope  History:         Patient has prior history of Echocardiogram examinations, most                  recent 10/30/2022. CHF and Cardiomyopathy, CAD and Previous                  Myocardial Infarction, Pacemaker and Defibrillator, Stroke,                  Arrythmias:Atrial Fibrillation and Bradycardia,                  Signs/Symptoms:Syncope and  Dyspnea; Risk Factors:Hypertension                  and Diabetes. AAA, CKD.  Sonographer:     Mikki Harbor Referring Phys:  [id number] BRITTON L RUST-CHESTER Diagnosing Phys: Rozell Searing Custovic IMPRESSIONS  1. Left ventricular ejection fraction, by estimation, is <20%. Left ventricular ejection fraction by 2D MOD biplane is 23.9 %. Left ventricular ejection fraction by PLAX is 20 %. The left ventricle has severely decreased function. The left ventricle demonstrates global hypokinesis. The left ventricular internal cavity size was moderately to severely dilated. There is mild left ventricular hypertrophy. Left ventricular diastolic parameters are consistent with Grade III diastolic dysfunction (restrictive).  2. Severe RV pressure and volume overload. RVSP falsely low due to equalization of pressures. Right ventricular systolic function is severely reduced. The right ventricular size is severely enlarged. There is normal pulmonary artery systolic pressure. The estimated right ventricular systolic pressure is 34.0 mmHg.  3. Left atrial size was severely dilated.  4. Right atrial size was severely dilated.  5. The mitral valve is degenerative. Moderate to severe mitral valve regurgitation.  6. The tricuspid valve is degenerative. Tricuspid valve regurgitation is severe.  7. The aortic valve is grossly normal. Aortic valve regurgitation is mild. Aortic valve sclerosis is present, with no evidence of aortic valve stenosis. FINDINGS  Left Ventricle: Left ventricular ejection fraction, by estimation, is <20%. Left ventricular ejection fraction by PLAX is 20 %. Left ventricular ejection fraction by 2D MOD biplane is 23.9 %. The left ventricle has severely decreased function. The left ventricle demonstrates global hypokinesis. The left ventricular internal cavity size was moderately to severely dilated. There is mild left ventricular hypertrophy. Left ventricular diastolic parameters are consistent with Grade III diastolic  dysfunction  (restrictive). Right Ventricle: Severe RV pressure and volume overload. RVSP falsely low due to equalization of pressures. The right ventricular  at [facility], [address], [place], [place] [address]       Radiology Studies last 3 days: ECHOCARDIOGRAM COMPLETE  Result Date: 09/11/2023    ECHOCARDIOGRAM REPORT   Patient Name:   Taylor [name] Date of Exam: 09/11/2023 Medical Rec #:  [id number]    Height:       70.0 in Accession #:    [id number]   Weight:       158.3 lb Date of Birth:  [birth year]   BSA:          1.890 m Patient Age:    82 years     BP:           103/69 mmHg Patient Gender: M            HR:           62 bpm. Exam Location:  [facility] Procedure: 2D Echo, 3D Echo, Cardiac Doppler and Color Doppler Indications:     Syncope  History:         Patient has prior history of Echocardiogram examinations, most                  recent 10/30/2022. CHF and Cardiomyopathy, CAD and Previous                  Myocardial Infarction, Pacemaker and Defibrillator, Stroke,                  Arrythmias:Atrial Fibrillation and Bradycardia,                  Signs/Symptoms:Syncope and  Dyspnea; Risk Factors:Hypertension                  and Diabetes. AAA, CKD.  Sonographer:     Mikki Harbor Referring Phys:  [id number] BRITTON L RUST-CHESTER Diagnosing Phys: Rozell Searing Custovic IMPRESSIONS  1. Left ventricular ejection fraction, by estimation, is <20%. Left ventricular ejection fraction by 2D MOD biplane is 23.9 %. Left ventricular ejection fraction by PLAX is 20 %. The left ventricle has severely decreased function. The left ventricle demonstrates global hypokinesis. The left ventricular internal cavity size was moderately to severely dilated. There is mild left ventricular hypertrophy. Left ventricular diastolic parameters are consistent with Grade III diastolic dysfunction (restrictive).  2. Severe RV pressure and volume overload. RVSP falsely low due to equalization of pressures. Right ventricular systolic function is severely reduced. The right ventricular size is severely enlarged. There is normal pulmonary artery systolic pressure. The estimated right ventricular systolic pressure is 34.0 mmHg.  3. Left atrial size was severely dilated.  4. Right atrial size was severely dilated.  5. The mitral valve is degenerative. Moderate to severe mitral valve regurgitation.  6. The tricuspid valve is degenerative. Tricuspid valve regurgitation is severe.  7. The aortic valve is grossly normal. Aortic valve regurgitation is mild. Aortic valve sclerosis is present, with no evidence of aortic valve stenosis. FINDINGS  Left Ventricle: Left ventricular ejection fraction, by estimation, is <20%. Left ventricular ejection fraction by PLAX is 20 %. Left ventricular ejection fraction by 2D MOD biplane is 23.9 %. The left ventricle has severely decreased function. The left ventricle demonstrates global hypokinesis. The left ventricular internal cavity size was moderately to severely dilated. There is mild left ventricular hypertrophy. Left ventricular diastolic parameters are consistent with Grade III diastolic  dysfunction  (restrictive). Right Ventricle: Severe RV pressure and volume overload. RVSP falsely low due to equalization of pressures. The right ventricular  at [facility], [address], [place], [place] [address]       Radiology Studies last 3 days: ECHOCARDIOGRAM COMPLETE  Result Date: 09/11/2023    ECHOCARDIOGRAM REPORT   Patient Name:   Taylor [name] Date of Exam: 09/11/2023 Medical Rec #:  [id number]    Height:       70.0 in Accession #:    [id number]   Weight:       158.3 lb Date of Birth:  [birth year]   BSA:          1.890 m Patient Age:    82 years     BP:           103/69 mmHg Patient Gender: M            HR:           62 bpm. Exam Location:  [facility] Procedure: 2D Echo, 3D Echo, Cardiac Doppler and Color Doppler Indications:     Syncope  History:         Patient has prior history of Echocardiogram examinations, most                  recent 10/30/2022. CHF and Cardiomyopathy, CAD and Previous                  Myocardial Infarction, Pacemaker and Defibrillator, Stroke,                  Arrythmias:Atrial Fibrillation and Bradycardia,                  Signs/Symptoms:Syncope and  Dyspnea; Risk Factors:Hypertension                  and Diabetes. AAA, CKD.  Sonographer:     Mikki Harbor Referring Phys:  [id number] BRITTON L RUST-CHESTER Diagnosing Phys: Rozell Searing Custovic IMPRESSIONS  1. Left ventricular ejection fraction, by estimation, is <20%. Left ventricular ejection fraction by 2D MOD biplane is 23.9 %. Left ventricular ejection fraction by PLAX is 20 %. The left ventricle has severely decreased function. The left ventricle demonstrates global hypokinesis. The left ventricular internal cavity size was moderately to severely dilated. There is mild left ventricular hypertrophy. Left ventricular diastolic parameters are consistent with Grade III diastolic dysfunction (restrictive).  2. Severe RV pressure and volume overload. RVSP falsely low due to equalization of pressures. Right ventricular systolic function is severely reduced. The right ventricular size is severely enlarged. There is normal pulmonary artery systolic pressure. The estimated right ventricular systolic pressure is 34.0 mmHg.  3. Left atrial size was severely dilated.  4. Right atrial size was severely dilated.  5. The mitral valve is degenerative. Moderate to severe mitral valve regurgitation.  6. The tricuspid valve is degenerative. Tricuspid valve regurgitation is severe.  7. The aortic valve is grossly normal. Aortic valve regurgitation is mild. Aortic valve sclerosis is present, with no evidence of aortic valve stenosis. FINDINGS  Left Ventricle: Left ventricular ejection fraction, by estimation, is <20%. Left ventricular ejection fraction by PLAX is 20 %. Left ventricular ejection fraction by 2D MOD biplane is 23.9 %. The left ventricle has severely decreased function. The left ventricle demonstrates global hypokinesis. The left ventricular internal cavity size was moderately to severely dilated. There is mild left ventricular hypertrophy. Left ventricular diastolic parameters are consistent with Grade III diastolic  dysfunction  (restrictive). Right Ventricle: Severe RV pressure and volume overload. RVSP falsely low due to equalization of pressures. The right ventricular  PROGRESS NOTE    [name]   ZOX:[id number] DOB: Jul 03, 1941  DOA: 09/10/2023 Date of Service: 09/11/23 which is hospital day 1  PCP: Malva Limes, MD    HPI: GERMAN MANKE is a 82 y.o. male past medical history significant for HFrEF (< 20%), CKD stage IV, hypertension, hyperlipidemia, diabetes, COPD on chronic home 2 L, CAD, history of atrial fibrillation on Eliquis, who presents to ED from home via EMS for fall x3. Multiple falls past month. Concern for low BP. No LOC.   Pertinent recent tx/imaging: 10/04: visit ED due to fall - (+)minimally displaced and comminuted radial styloid fracture --> splint to left forearm. (+)fx nasal bones and L maxilla.   10/10: ENT visit, discuss repair options for facial fx, not interested, conservative mgt.   Hospital course / significant events:  10/16: to ED. (+)sepsis, severe - hypothermic Tmin 30F, low BP 80s/60s, AKI, hyperkalemic K max 6.8 improved w/ tx, Cr on admission 5.46 above baseline 2.7, lactic max 3.8. CT (+)PNA RML and LLL, mod R pleural effusion also noted on previous CT. Tx midodrine, steroids, fluids, vanc/cefepime.  10/17: BP improved, renal fxn and K improved, Echo LVEF <20%, severely decreased fxn and global hypokinesis, grade III diastolic dysfunction, severe RV overload, severe RA and LA dilation. Renal US pending read.   Consultants:  Nephrology PCCU Palliative care  Cardiology, advanced HF team   Procedures/Surgeries: none      ASSESSMENT & PLAN:   Severe sepsis due to HCAP on CT, RML, LLL. POA (+)MRSA screen, pending cultures  Neg COVID and resp viral PCR  meets criteria for severe sepsis with WBC 13.9, hypothermia with temperature 92.1.  Lactic acid is elevated at 3.8 --> 2.9. Patient has been persistently hypotensive. admit to SDU as inp Vancomycin and cefepime initiated 10/16 and patient received 1 dose of Levaquin in ED. MRSA screen (+), continue vancomycin  Blood and sputum cultures, urine antigen of  Legionella and strep IV fluid: total of 1.75 L of NS, then 150 mEq of sodium bicarbonate at 75 cc/h - caution w/ Hx CHF Midodrine Phenylephrine d/c this morning - monitor and low threshold to restart pressors   Acute renal failure superimposed on stage 4 chronic kidney disease Hyperkalemia and hyperphosphatemia likely as result of AKI  creatinine 5.46, BUN 93 and GFR 10 (recent baseline creatinine 2.82 and GFR 22 on 10//24).   Ddx include UTI, dehydration and continuation of diuretics.  ATN is possible due to hypotension. Question hepatorenal syndrome.   Nephrology following  holding potassium and spironolactone bicarb infusion. Hold nephrotoxic rx  Follow UA --> no UTI, (+)hyaline casts, (+)micro hematuria Renal US --> pending read   Hypotension Likely multifactorial - DDx including severe sepsis, dehydration and continuation for diuretics.  Hepatorenal syndrome is potential differential diagnosis. In ED, received solu-cortef, midodrine, albumin  Tx sepsis as above  Midodrine Phenylephrine d/c this morning - monitor and low threshold to restart pressors Consulted ICU --> following Echocardiogram --> severe disease see below    Hypothermia Bair hugger Tx sepsis as above    Hyperkalemia:  Potassium 6.8 --> 6.1. Likely d/t AKI/ATN pt was treated with 1 g calcium gluconate, 5 units of NovoLog, 50 cc of D50, 50 mEq of sodium bicarbonate in ED S/p Patiromer 16.8 g  IVF as above Nephrology following  consulted pharmacist for electrolytes monitoring   Hx of CAD S/P percutaneous coronary angioplasty and myocardial injury:  trop  19 and trended down   Denies chest

## 2023-09-11 NOTE — Progress Notes (Addendum)
PHARMACY CONSULT NOTE - ELECTROLYTES  Pharmacy Consult for Electrolyte Monitoring and Replacement   Recent Labs: Height: 5\' 10"  (177.8 cm) Weight: 71.8 kg (158 lb 4.6 oz) IBW/kg (Calculated) : 73 Estimated Creatinine Clearance: 12.1 mL/min (A) (by C-G formula based on SCr of 4.95 mg/dL (H)). Potassium (mmol/L)  Date Value  09/11/2023 6.0 (H)   Magnesium (mg/dL)  Date Value  86/57/8469 2.3   Calcium (mg/dL)  Date Value  62/95/2841 8.7 (L)   Albumin (g/dL)  Date Value  32/44/0102 2.8 (L)  05/05/2023 3.1 (L)   Phosphorus (mg/dL)  Date Value  72/53/6644 5.9 (H)   Sodium (mmol/L)  Date Value  09/11/2023 132 (L)  05/05/2023 141   Corrected Ca: 9.66 mg/dL  Assessment  sepsis without clear source  Taylor [name] is a 82 y.o. male presenting with sepsis. PMH significant for CHF with EF < 20%, CKD-4, HTN, HLD, DM, COPD on 2L O2, CAD with stent and hx of CABG, stroke, A fib on Eliquis, s/p of AICD, melanoma, psoriatic arthritis, pancreatitis . Pharmacy has been consulted to monitor hyperkalemia due to worsening renal function.  Diet: PO Pertinent medications: None  Goal of Therapy: Electrolytes WNL  Plan:  K 6.0 s/p insulin aspart 25 units + dextrose, Lokelma 10 gm x 1, calcium gluconate 1 gm x 1, Patiromer 8.4 gm x 1, and sodium bicarb 50 mEq IV x 2 Lokelma 10 gm Q8H x 5 doses ordered Calcium gluconate 1 gm x 1 ordered Sodium bicarb 150 mEq @ 75 mL/hr Repeat K 5.5 - anticipate it will continue to downtrend with above agents Check BMP tomorrow with AM labs   Thank you for allowing pharmacy to be a part of this patient's care.  Littie Deeds, PharmD Pharmacy Resident  09/11/2023 7:21 AM

## 2023-09-11 NOTE — Progress Notes (Signed)
*  PRELIMINARY RESULTS* Echocardiogram 2D Echocardiogram has been performed.  Carolyne Fiscal 09/11/2023, 10:01 AM

## 2023-09-11 NOTE — Progress Notes (Signed)
OT Cancellation Note  Patient Details Name: ELION HOCKER MRN: [id number] DOB: 03-21-1941   Cancelled Treatment:    Reason Eval/Treat Not Completed: Medical issues which prohibited therapy. Chart reviewed - pt noted to have K+ critically high at 6.0; per MD plan to hold this AM. Will continue to follow and initiate services as pt medically appropriate to participate in therapy.   Kathie Dike, M.S. OTR/L  09/11/23, 10:20 AM  ascom [phone]

## 2023-09-11 NOTE — Progress Notes (Signed)
PT Cancellation Note  Patient Details Name: Taylor [name] MRN: [id number] DOB: 01/27/1941   Cancelled Treatment:    Reason Eval/Treat Not Completed: Other (comment). Per chart review pt with elevated K+. Discussed with MD, PT to hold this AM, will re-attempt as able.   Olga Coaster PT, DPT 8:49 AM,09/11/23

## 2023-09-11 NOTE — Hospital Course (Addendum)
HPI: Taylor [name] is a 82 y.o. male past medical history significant for HFrEF (< 20%), CKD stage IV, hypertension, hyperlipidemia, diabetes, COPD on chronic home 2 L, CAD, history of atrial fibrillation on Eliquis, who presents to ED from home via EMS for fall x3. Multiple falls past month. Concern for low BP. No LOC.   Pertinent recent tx/imaging: 10/04: visit ED due to fall - (+)minimally displaced and comminuted radial styloid fracture --> splint to left forearm. (+)fx nasal bones and L maxilla.   10/10: ENT visit, discuss repair options for facial fx, not interested, conservative mgt.   Hospital course / significant events:  10/16: to ED. (+)sepsis, severe - hypothermic Tmin 21F, low BP 80s/60s, AKI, hyperkalemic K max 6.8 improved w/ tx, Cr on admission 5.46 above baseline 2.7, lactic max 3.8. CT (+)PNA RML and LLL, mod R pleural effusion also noted on previous CT. Tx midodrine, steroids, fluids, vanc/cefepime.  10/17: BP improved, renal fxn and K improved, Echo LVEF <20%, severely decreased fxn and global hypokinesis, grade III diastolic dysfunction, severe RV overload, severe RA and LA dilation. Renal US pending read. Palliative care consult, see note. Cardiology consult 10/18: stable off pressors, transfer to progressive unit. Continue to monitor renal/cardiac function closely.  10/19: no improvement in renal function, UOP improved, continue to monitor.  10/20: some improved renal fxn, continued chest pain, agrees for DNR/DNI see A/P below. Worsening chest pain, requiring nitroglycerin gtt, per cardiology not candidate for intervention so will not get troponin, will cont nitroglycerin and heparin gtt for 24-48 hours.  10/21: remains on drips, palliative care following, pt/family desiring aggressive measures except CPR/resuscitation in event of arrest.   Consultants:  Nephrology PCCU Palliative care  Cardiology, advanced HF team   Procedures/Surgeries: none      ASSESSMENT &  PLAN:    INFECTIOUS   Severe sepsis due to HCAP on CT, RML, LLL. POA (+)MRSA screen, pending cultures  Neg COVID and resp viral PCR  meets criteria for severe sepsis with WBC 13.9, hypothermia with temperature 92.1.  Lactic acid is elevated at 3.8 --> 2.9. Patient has been persistently hypotensive. Completed abx  Blood and sputum cultures, urine antigen of Legionella and strep Midodrine continuing  Hypothermia - resolved Tx sepsis as above     RENAL  Acute renal failure superimposed on stage 4 chronic kidney disease Hyperkalemia and hyperphosphatemia likely as result of AKI  creatinine 5.46, BUN 93 and GFR 10 (recent baseline creatinine 2.82 and GFR 22 on 10//24) --> Cr 5.17 today 09/13/23  Ddx include UTI, dehydration and continuation of diuretics.  ATN is possible due to hypotension. Question hepatorenal syndrome.   Nephrology and cardiology following  Hold nephrotoxic rx  Poor candidate for long term dialysis   Hyperkalemia: resolved Nephrology following  Monitor BMP  Hyponatremia likely d/t AKI resolved Monitor BMP Treat underlying causes    CARDIAC  Persistent chest pain / unstable angina Continue nitrog gtt and heparin gtt Cardiology following If cardiopulmonary decompensation such that death is imminent, have ordered morphine push asap and RN to alert hospitalist coverage prior to administration of this, essentially if pulseless / fatal arrhythmia will do what we can to relieve pain but I have advised the patient that decompensation may be sudden and we may not be able to help his pain if/when that occurs  Chronic combined systolic and diastolic CHF (congestive heart failure)  2D echo on 10/30/2022 showed EF<20%.   Patient has elevated BNP 647 which is below his baseline,  no leg edema or JVD.  Does not seem to have CHF exacerbation Echo this admission: Echo LVEF <20%, severely decreased fxn and global hypokinesis, grade III diastolic dysfunction, severe RV  overload, severe RA and LA dilation Hold diuretics due to severe sepsis, Nephrology ok for resume low dose on discharge  Watch volume status closely  Cardiology following - no plans for further diagnostics at this time Very poor prognosis, family and patient agreeable for no CPR but are not interested in comfort measures only at this time despite worsening chest pain requiring nitrog drip which he may not be able to come off of without severe pain.   Hypotension Likely multifactorial - DDx including severe sepsis, dehydration and continuation for diuretics.  Hepatorenal syndrome is potential differential diagnosis. Low CO d/t CHF.  In ED, received solu-cortef, midodrine, albumin  Tx sepsis as above  Midodrine Phenylephrine d/c 10/17 morning - monitor and low threshold to restart pressors Cardiology team following Echocardiogram --> severe disease see below   Hx of CAD S/P percutaneous coronary angioplasty and myocardial injury:  trop  19 and trended down   Denies chest pain Continue Aspirin hold lipitor due to liver cirrhosis hold imdur due to hypotension   Essential (primary) hypertension Hold blood pressure medications due to hypotension   Atrial fibrillation (HCC): rate controlled/bradycardic, HR 50-60s Hold Coreg d/t bradycardia and hypotension  Hold Eliquis since patient has frequent fall On heparin now for angina  Hypercholesteremia hold Lipitor d/t liver disease    RESPIRATORY   Chronic obstructive pulmonary disease, unspecified COPD type Chronic respiratory failure on 2L O2 [place] at home  Bronchodilators, as needed Mucinex Supplemental O2 as needed  Sore throat / sinus dryness today, humidify O2     NEUROLOGIC   History of CVA (cerebrovascular accident) Aspirin   HEMATOLOGIC  Elevated INR and thrombocytopenia likely d/t liver disease monitor   GASTRO  Liver cirrhosis  Non-alcoholic Liver Cirrhosis with ascites  Hyperammonemia, mild - elevated 42.   Patient is alert, oriented x 3. Start lactulose 10 g twice daily   ENDOCRINE  Diabetes Type 2, well controlled w/ A1C 6.6 Monitor Glc w/ daily labs SSI if needed   OTHER  Advanced care planning - discussion 09/12/23 and 09/14/23  D/w patient and wife on rounds this 10/18 - cardiac/renal prognosis is poor and I do not believe he would recover if he were to decompensate (worsening HF, hypotension not responding to pressors, cardiac arrest/arhythmia). I was clear that I recommend against CPR as this would be painful without reasonable chance of benefit. Alternative would be that if he were to decompensate such that death felt to be imminent, we would ensure he was not in pain or distress. We would do any and all treatments short of CPR/intubation, meaning a no-CPR status would not mean "do nothing"  Revisited this issue again this morning 09/14/23. Pt having chest pain on and off. I discussed with him that this could mean heart attack which could decompensate quickly / unpredictably, vs angina that is likely to come and go indefinitely but is an indicator of worsening heart disease. I again stated that CPR is certain to cause significant pain/stress and is very unlikely to result in any meaningful benefit. He agrees for DO NOT RESUSCITATE as in NO CPR/intubation. RN Shanda Bumps T present for this conversation approx 10:00 09/14/23 and I will discuss with family later today when they are here. - see note See above re persistent chest pain / angina  Fall fall  precaution PT/OT Holding Eliquis   Obstructive sleep apnea CPAP as toleratred   Recent radial styloid fracture_left:: on splint prn tylenol   Knee pain suspect gout flare No effusion evident [place] Low threshold ortho consult for aspiration but no obvious effusion on exam Monitor     underweight based on BMI: Body mass index is 23.98 kg/m. Concern for mild-moderate protein calorie malnutrition based on BMI, low albumin,  multiple medical comorbidities     DVT prophylaxis: SCD, ASA. High bleed risk w/ elevation INR and has been on Eliquis, multiple falls  IV fluids: no continuous IV fluids  Nutrition: cardiac/carb diet  Central lines / invasive devices: none  Code Status: FULL CODE ACP documentation reviewed: none on file in [facility]  TOC needs: TBD Barriers to dispo / significant pending items: clinical improvement, expect may not be able to come off nitrog gtt, monitor and reinvolve palliative prn

## 2023-09-11 NOTE — Consult Note (Cosign Needed Addendum)
70.0 in Accession #:    [id number]   Weight:       158.3 lb Date of Birth:  06/18/1941   BSA:          1.890 m Patient Age:    81 years     BP:           103/69 mmHg Patient Gender: M            HR:           62 bpm. Exam Location:  [facility] Procedure: 2D Echo, 3D Echo, Cardiac Doppler and Color Doppler Indications:     Syncope  History:         Patient has prior history of Echocardiogram examinations, most                  recent 10/30/2022. CHF and Cardiomyopathy, CAD and Previous                  Myocardial Infarction, Pacemaker and Defibrillator, Stroke,                  Arrythmias:Atrial Fibrillation and Bradycardia,                  Signs/Symptoms:Syncope and Dyspnea; Risk Factors:Hypertension                  and Diabetes. AAA, CKD.  Sonographer:     Mikki Harbor Referring Phys:  [id number] BRITTON L RUST-CHESTER Diagnosing Phys: Rozell Searing Custovic IMPRESSIONS  1. Left ventricular ejection fraction, by estimation, is <20%. Left ventricular ejection fraction by 2D MOD biplane is 23.9 %. Left ventricular  ejection fraction by PLAX is 20 %. The left ventricle has severely decreased function. The left ventricle demonstrates global hypokinesis. The left ventricular internal cavity size was moderately to severely dilated. There is mild left ventricular hypertrophy. Left ventricular diastolic parameters are consistent with Grade III diastolic dysfunction (restrictive).  2. Severe RV pressure and volume overload. RVSP falsely low due to equalization of pressures. Right ventricular systolic function is severely reduced. The right ventricular size is severely enlarged. There is normal pulmonary artery systolic pressure. The estimated right ventricular systolic pressure is 34.0 mmHg.  3. Left atrial size was severely dilated.  4. Right atrial size was severely dilated.  5. The mitral valve is degenerative. Moderate to severe mitral valve regurgitation.  6. The tricuspid valve is degenerative. Tricuspid valve regurgitation is severe.  7. The aortic valve is grossly normal. Aortic valve regurgitation is mild. Aortic valve sclerosis is present, with no evidence of aortic valve stenosis. FINDINGS  Left Ventricle: Left ventricular ejection fraction, by estimation, is <20%. Left ventricular ejection fraction by PLAX is 20 %. Left ventricular ejection fraction by 2D MOD biplane is 23.9 %. The left ventricle has severely decreased function. The left ventricle demonstrates global hypokinesis. The left ventricular internal cavity size was moderately to severely dilated. There is mild left ventricular hypertrophy. Left ventricular diastolic parameters are consistent with Grade III diastolic dysfunction  (restrictive). Right Ventricle: Severe RV pressure and volume overload. RVSP falsely low due to equalization of pressures. The right ventricular size is severely enlarged. No increase in right ventricular wall thickness. Right ventricular systolic function is severely reduced. There is normal pulmonary artery systolic pressure. The  tricuspid regurgitant velocity is 2.18 m/s, and with an assumed right atrial pressure of 15 mmHg, the estimated right ventricular systolic pressure is 34.0 mmHg. Left Atrium: Left atrial size was severely dilated. Right Atrium:  [facility] CLINIC CARDIOLOGY CONSULT NOTE       Patient ID: Taylor [name] MRN: [id number] DOB/AGE: September 09, 1941 82 y.o.  Admit date: 09/10/2023 Referring Physician Dr. Zada Girt Primary Physician Sherrie Mustache, Demetrios Isaacs, MD  Primary Cardiologist Dr. Marcina Millard Reason for Consultation Chronic Heart Failure, EF < 20%  HPI: Taylor [name] is a 82 y.o. male  with a past medical history of paroxysmal atrial fibrillation (Eliquis), chronic HFrEF (EF < 20%), s/p CABG (08/2006), s/p dual-chamber ICD (2014), SSS, HTN, HLD, type 2 diabetes, pancreatitis, ischemic cardiomyopathy, ventricular fibrillation arrest (1994) s/p MI infarction, bilateral carotid artery stenosis s/p left carotid stent (06/2022), abdominal aortic aneurysm s/p endovascular repair (2008), CKD stage 4, COPD (baseline 2L [place]) liver cirrhosis with ascites (CT 08/2023), who presented to the ED on 09/10/2023 for frequent falls and generalized weakness. Cardiology was consulted for further evaluation.   Patient reports he feels overall fatigued with mild shortness of breath. Patient denies chest pain/pressure or palpitations. Patient denies any lower extremity swelling. He reports generalized weakness and falling more frequently. Patient was seen in the ED on 08/29/2023 and was found to have a left radial styloid fracture and displaced fractures of bilateral nasal bones and frontal process of left maxilla and left forehead and periorbital hematoma. Patient currently has a left wrist brace on. Work-up in the ED notable for Na 135, K 6.8, Cr 5.46 (baseline of 2.8), Phos 6.5, Mg 2.8, GFR 10, BNP 647.1, Trop trend 19 > 15, Lactic acid trend 3.8 >2.9 > 2.7 > 2.5 > 3.4, procal 1.13, WBC trend 13.9 > 9.7, Hgb 12.3 > 11.0, platelets 133 > 129. Patient was hypotensive with SBP in 80s and hypothermic at 94.1. CT of chest/abd/pelvis showed new patchy airspace in right middle and left lower lobe with moderate right sided pleural effusion. In the ED, patient  received fluids, solu-cortef, midodrine and one dose of Levaquin.  At the time of my evaluation this afternoon, patient was laying in his hospital bed at an incline with wife at bedside. Patient states he feels fatigued with mild shortness of breath. Patient is on 2L [place] and reports that is his baseline at home. Patient denies any chest pain, palpitations, dizziness, or lower extremity edema. Patient reports he's mostly concerned about his "body pain" after falling.   History given by patient and patient's wife also confirmed history.  Review of systems complete and found to be negative unless listed above    Past Medical History:  Diagnosis Date   AAA (abdominal aortic aneurysm) (HCC) 06/03/2007   [facility]; Dr. Hart [place]   AICD (automatic cardioverter/defibrillator) present    Arrhythmia    atrial fibrillation   Barrett's esophagus    Bladder cancer (HCC)    Bradycardia    CAD (coronary artery disease)    CAP (community acquired pneumonia) 11/13/2019   CHF (congestive heart failure) (HCC)    Cluster headache    COVID-19 09/27/2021   DDD (degenerative disc disease), lumbar    Diabetes mellitus without complication (HCC)    Dyspnea    WITH EXERTION   Edema    LEFT ANKLE   Fracture of skull base (HCC) 1997   due to fall   GERD (gastroesophageal reflux disease)    Gout    History of bladder cancer 12/1995   Hyperlipidemia    Hypertension    Hypocalcemia 04/19/2020   Possibly secondary to diuretics.   Low=5.9 04/19/2020   Malignant melanoma (HCC) 12/2012   right dorsal  70.0 in Accession #:    [id number]   Weight:       158.3 lb Date of Birth:  06/18/1941   BSA:          1.890 m Patient Age:    81 years     BP:           103/69 mmHg Patient Gender: M            HR:           62 bpm. Exam Location:  [facility] Procedure: 2D Echo, 3D Echo, Cardiac Doppler and Color Doppler Indications:     Syncope  History:         Patient has prior history of Echocardiogram examinations, most                  recent 10/30/2022. CHF and Cardiomyopathy, CAD and Previous                  Myocardial Infarction, Pacemaker and Defibrillator, Stroke,                  Arrythmias:Atrial Fibrillation and Bradycardia,                  Signs/Symptoms:Syncope and Dyspnea; Risk Factors:Hypertension                  and Diabetes. AAA, CKD.  Sonographer:     Mikki Harbor Referring Phys:  [id number] BRITTON L RUST-CHESTER Diagnosing Phys: Rozell Searing Custovic IMPRESSIONS  1. Left ventricular ejection fraction, by estimation, is <20%. Left ventricular ejection fraction by 2D MOD biplane is 23.9 %. Left ventricular  ejection fraction by PLAX is 20 %. The left ventricle has severely decreased function. The left ventricle demonstrates global hypokinesis. The left ventricular internal cavity size was moderately to severely dilated. There is mild left ventricular hypertrophy. Left ventricular diastolic parameters are consistent with Grade III diastolic dysfunction (restrictive).  2. Severe RV pressure and volume overload. RVSP falsely low due to equalization of pressures. Right ventricular systolic function is severely reduced. The right ventricular size is severely enlarged. There is normal pulmonary artery systolic pressure. The estimated right ventricular systolic pressure is 34.0 mmHg.  3. Left atrial size was severely dilated.  4. Right atrial size was severely dilated.  5. The mitral valve is degenerative. Moderate to severe mitral valve regurgitation.  6. The tricuspid valve is degenerative. Tricuspid valve regurgitation is severe.  7. The aortic valve is grossly normal. Aortic valve regurgitation is mild. Aortic valve sclerosis is present, with no evidence of aortic valve stenosis. FINDINGS  Left Ventricle: Left ventricular ejection fraction, by estimation, is <20%. Left ventricular ejection fraction by PLAX is 20 %. Left ventricular ejection fraction by 2D MOD biplane is 23.9 %. The left ventricle has severely decreased function. The left ventricle demonstrates global hypokinesis. The left ventricular internal cavity size was moderately to severely dilated. There is mild left ventricular hypertrophy. Left ventricular diastolic parameters are consistent with Grade III diastolic dysfunction  (restrictive). Right Ventricle: Severe RV pressure and volume overload. RVSP falsely low due to equalization of pressures. The right ventricular size is severely enlarged. No increase in right ventricular wall thickness. Right ventricular systolic function is severely reduced. There is normal pulmonary artery systolic pressure. The  tricuspid regurgitant velocity is 2.18 m/s, and with an assumed right atrial pressure of 15 mmHg, the estimated right ventricular systolic pressure is 34.0 mmHg. Left Atrium: Left atrial size was severely dilated. Right Atrium:  [facility] CLINIC CARDIOLOGY CONSULT NOTE       Patient ID: Taylor [name] MRN: [id number] DOB/AGE: September 09, 1941 82 y.o.  Admit date: 09/10/2023 Referring Physician Dr. Zada Girt Primary Physician Sherrie Mustache, Demetrios Isaacs, MD  Primary Cardiologist Dr. Marcina Millard Reason for Consultation Chronic Heart Failure, EF < 20%  HPI: Taylor [name] is a 82 y.o. male  with a past medical history of paroxysmal atrial fibrillation (Eliquis), chronic HFrEF (EF < 20%), s/p CABG (08/2006), s/p dual-chamber ICD (2014), SSS, HTN, HLD, type 2 diabetes, pancreatitis, ischemic cardiomyopathy, ventricular fibrillation arrest (1994) s/p MI infarction, bilateral carotid artery stenosis s/p left carotid stent (06/2022), abdominal aortic aneurysm s/p endovascular repair (2008), CKD stage 4, COPD (baseline 2L [place]) liver cirrhosis with ascites (CT 08/2023), who presented to the ED on 09/10/2023 for frequent falls and generalized weakness. Cardiology was consulted for further evaluation.   Patient reports he feels overall fatigued with mild shortness of breath. Patient denies chest pain/pressure or palpitations. Patient denies any lower extremity swelling. He reports generalized weakness and falling more frequently. Patient was seen in the ED on 08/29/2023 and was found to have a left radial styloid fracture and displaced fractures of bilateral nasal bones and frontal process of left maxilla and left forehead and periorbital hematoma. Patient currently has a left wrist brace on. Work-up in the ED notable for Na 135, K 6.8, Cr 5.46 (baseline of 2.8), Phos 6.5, Mg 2.8, GFR 10, BNP 647.1, Trop trend 19 > 15, Lactic acid trend 3.8 >2.9 > 2.7 > 2.5 > 3.4, procal 1.13, WBC trend 13.9 > 9.7, Hgb 12.3 > 11.0, platelets 133 > 129. Patient was hypotensive with SBP in 80s and hypothermic at 94.1. CT of chest/abd/pelvis showed new patchy airspace in right middle and left lower lobe with moderate right sided pleural effusion. In the ED, patient  received fluids, solu-cortef, midodrine and one dose of Levaquin.  At the time of my evaluation this afternoon, patient was laying in his hospital bed at an incline with wife at bedside. Patient states he feels fatigued with mild shortness of breath. Patient is on 2L [place] and reports that is his baseline at home. Patient denies any chest pain, palpitations, dizziness, or lower extremity edema. Patient reports he's mostly concerned about his "body pain" after falling.   History given by patient and patient's wife also confirmed history.  Review of systems complete and found to be negative unless listed above    Past Medical History:  Diagnosis Date   AAA (abdominal aortic aneurysm) (HCC) 06/03/2007   [facility]; Dr. Hart [place]   AICD (automatic cardioverter/defibrillator) present    Arrhythmia    atrial fibrillation   Barrett's esophagus    Bladder cancer (HCC)    Bradycardia    CAD (coronary artery disease)    CAP (community acquired pneumonia) 11/13/2019   CHF (congestive heart failure) (HCC)    Cluster headache    COVID-19 09/27/2021   DDD (degenerative disc disease), lumbar    Diabetes mellitus without complication (HCC)    Dyspnea    WITH EXERTION   Edema    LEFT ANKLE   Fracture of skull base (HCC) 1997   due to fall   GERD (gastroesophageal reflux disease)    Gout    History of bladder cancer 12/1995   Hyperlipidemia    Hypertension    Hypocalcemia 04/19/2020   Possibly secondary to diuretics.   Low=5.9 04/19/2020   Malignant melanoma (HCC) 12/2012   right dorsal  [facility] CLINIC CARDIOLOGY CONSULT NOTE       Patient ID: Taylor [name] MRN: [id number] DOB/AGE: September 09, 1941 82 y.o.  Admit date: 09/10/2023 Referring Physician Dr. Zada Girt Primary Physician Sherrie Mustache, Demetrios Isaacs, MD  Primary Cardiologist Dr. Marcina Millard Reason for Consultation Chronic Heart Failure, EF < 20%  HPI: Taylor [name] is a 82 y.o. male  with a past medical history of paroxysmal atrial fibrillation (Eliquis), chronic HFrEF (EF < 20%), s/p CABG (08/2006), s/p dual-chamber ICD (2014), SSS, HTN, HLD, type 2 diabetes, pancreatitis, ischemic cardiomyopathy, ventricular fibrillation arrest (1994) s/p MI infarction, bilateral carotid artery stenosis s/p left carotid stent (06/2022), abdominal aortic aneurysm s/p endovascular repair (2008), CKD stage 4, COPD (baseline 2L [place]) liver cirrhosis with ascites (CT 08/2023), who presented to the ED on 09/10/2023 for frequent falls and generalized weakness. Cardiology was consulted for further evaluation.   Patient reports he feels overall fatigued with mild shortness of breath. Patient denies chest pain/pressure or palpitations. Patient denies any lower extremity swelling. He reports generalized weakness and falling more frequently. Patient was seen in the ED on 08/29/2023 and was found to have a left radial styloid fracture and displaced fractures of bilateral nasal bones and frontal process of left maxilla and left forehead and periorbital hematoma. Patient currently has a left wrist brace on. Work-up in the ED notable for Na 135, K 6.8, Cr 5.46 (baseline of 2.8), Phos 6.5, Mg 2.8, GFR 10, BNP 647.1, Trop trend 19 > 15, Lactic acid trend 3.8 >2.9 > 2.7 > 2.5 > 3.4, procal 1.13, WBC trend 13.9 > 9.7, Hgb 12.3 > 11.0, platelets 133 > 129. Patient was hypotensive with SBP in 80s and hypothermic at 94.1. CT of chest/abd/pelvis showed new patchy airspace in right middle and left lower lobe with moderate right sided pleural effusion. In the ED, patient  received fluids, solu-cortef, midodrine and one dose of Levaquin.  At the time of my evaluation this afternoon, patient was laying in his hospital bed at an incline with wife at bedside. Patient states he feels fatigued with mild shortness of breath. Patient is on 2L [place] and reports that is his baseline at home. Patient denies any chest pain, palpitations, dizziness, or lower extremity edema. Patient reports he's mostly concerned about his "body pain" after falling.   History given by patient and patient's wife also confirmed history.  Review of systems complete and found to be negative unless listed above    Past Medical History:  Diagnosis Date   AAA (abdominal aortic aneurysm) (HCC) 06/03/2007   [facility]; Dr. Hart [place]   AICD (automatic cardioverter/defibrillator) present    Arrhythmia    atrial fibrillation   Barrett's esophagus    Bladder cancer (HCC)    Bradycardia    CAD (coronary artery disease)    CAP (community acquired pneumonia) 11/13/2019   CHF (congestive heart failure) (HCC)    Cluster headache    COVID-19 09/27/2021   DDD (degenerative disc disease), lumbar    Diabetes mellitus without complication (HCC)    Dyspnea    WITH EXERTION   Edema    LEFT ANKLE   Fracture of skull base (HCC) 1997   due to fall   GERD (gastroesophageal reflux disease)    Gout    History of bladder cancer 12/1995   Hyperlipidemia    Hypertension    Hypocalcemia 04/19/2020   Possibly secondary to diuretics.   Low=5.9 04/19/2020   Malignant melanoma (HCC) 12/2012   right dorsal  [facility] CLINIC CARDIOLOGY CONSULT NOTE       Patient ID: Taylor [name] MRN: [id number] DOB/AGE: September 09, 1941 82 y.o.  Admit date: 09/10/2023 Referring Physician Dr. Zada Girt Primary Physician Sherrie Mustache, Demetrios Isaacs, MD  Primary Cardiologist Dr. Marcina Millard Reason for Consultation Chronic Heart Failure, EF < 20%  HPI: Taylor [name] is a 82 y.o. male  with a past medical history of paroxysmal atrial fibrillation (Eliquis), chronic HFrEF (EF < 20%), s/p CABG (08/2006), s/p dual-chamber ICD (2014), SSS, HTN, HLD, type 2 diabetes, pancreatitis, ischemic cardiomyopathy, ventricular fibrillation arrest (1994) s/p MI infarction, bilateral carotid artery stenosis s/p left carotid stent (06/2022), abdominal aortic aneurysm s/p endovascular repair (2008), CKD stage 4, COPD (baseline 2L [place]) liver cirrhosis with ascites (CT 08/2023), who presented to the ED on 09/10/2023 for frequent falls and generalized weakness. Cardiology was consulted for further evaluation.   Patient reports he feels overall fatigued with mild shortness of breath. Patient denies chest pain/pressure or palpitations. Patient denies any lower extremity swelling. He reports generalized weakness and falling more frequently. Patient was seen in the ED on 08/29/2023 and was found to have a left radial styloid fracture and displaced fractures of bilateral nasal bones and frontal process of left maxilla and left forehead and periorbital hematoma. Patient currently has a left wrist brace on. Work-up in the ED notable for Na 135, K 6.8, Cr 5.46 (baseline of 2.8), Phos 6.5, Mg 2.8, GFR 10, BNP 647.1, Trop trend 19 > 15, Lactic acid trend 3.8 >2.9 > 2.7 > 2.5 > 3.4, procal 1.13, WBC trend 13.9 > 9.7, Hgb 12.3 > 11.0, platelets 133 > 129. Patient was hypotensive with SBP in 80s and hypothermic at 94.1. CT of chest/abd/pelvis showed new patchy airspace in right middle and left lower lobe with moderate right sided pleural effusion. In the ED, patient  received fluids, solu-cortef, midodrine and one dose of Levaquin.  At the time of my evaluation this afternoon, patient was laying in his hospital bed at an incline with wife at bedside. Patient states he feels fatigued with mild shortness of breath. Patient is on 2L [place] and reports that is his baseline at home. Patient denies any chest pain, palpitations, dizziness, or lower extremity edema. Patient reports he's mostly concerned about his "body pain" after falling.   History given by patient and patient's wife also confirmed history.  Review of systems complete and found to be negative unless listed above    Past Medical History:  Diagnosis Date   AAA (abdominal aortic aneurysm) (HCC) 06/03/2007   [facility]; Dr. Hart [place]   AICD (automatic cardioverter/defibrillator) present    Arrhythmia    atrial fibrillation   Barrett's esophagus    Bladder cancer (HCC)    Bradycardia    CAD (coronary artery disease)    CAP (community acquired pneumonia) 11/13/2019   CHF (congestive heart failure) (HCC)    Cluster headache    COVID-19 09/27/2021   DDD (degenerative disc disease), lumbar    Diabetes mellitus without complication (HCC)    Dyspnea    WITH EXERTION   Edema    LEFT ANKLE   Fracture of skull base (HCC) 1997   due to fall   GERD (gastroesophageal reflux disease)    Gout    History of bladder cancer 12/1995   Hyperlipidemia    Hypertension    Hypocalcemia 04/19/2020   Possibly secondary to diuretics.   Low=5.9 04/19/2020   Malignant melanoma (HCC) 12/2012   right dorsal  70.0 in Accession #:    [id number]   Weight:       158.3 lb Date of Birth:  06/18/1941   BSA:          1.890 m Patient Age:    81 years     BP:           103/69 mmHg Patient Gender: M            HR:           62 bpm. Exam Location:  [facility] Procedure: 2D Echo, 3D Echo, Cardiac Doppler and Color Doppler Indications:     Syncope  History:         Patient has prior history of Echocardiogram examinations, most                  recent 10/30/2022. CHF and Cardiomyopathy, CAD and Previous                  Myocardial Infarction, Pacemaker and Defibrillator, Stroke,                  Arrythmias:Atrial Fibrillation and Bradycardia,                  Signs/Symptoms:Syncope and Dyspnea; Risk Factors:Hypertension                  and Diabetes. AAA, CKD.  Sonographer:     Mikki Harbor Referring Phys:  [id number] BRITTON L RUST-CHESTER Diagnosing Phys: Rozell Searing Custovic IMPRESSIONS  1. Left ventricular ejection fraction, by estimation, is <20%. Left ventricular ejection fraction by 2D MOD biplane is 23.9 %. Left ventricular  ejection fraction by PLAX is 20 %. The left ventricle has severely decreased function. The left ventricle demonstrates global hypokinesis. The left ventricular internal cavity size was moderately to severely dilated. There is mild left ventricular hypertrophy. Left ventricular diastolic parameters are consistent with Grade III diastolic dysfunction (restrictive).  2. Severe RV pressure and volume overload. RVSP falsely low due to equalization of pressures. Right ventricular systolic function is severely reduced. The right ventricular size is severely enlarged. There is normal pulmonary artery systolic pressure. The estimated right ventricular systolic pressure is 34.0 mmHg.  3. Left atrial size was severely dilated.  4. Right atrial size was severely dilated.  5. The mitral valve is degenerative. Moderate to severe mitral valve regurgitation.  6. The tricuspid valve is degenerative. Tricuspid valve regurgitation is severe.  7. The aortic valve is grossly normal. Aortic valve regurgitation is mild. Aortic valve sclerosis is present, with no evidence of aortic valve stenosis. FINDINGS  Left Ventricle: Left ventricular ejection fraction, by estimation, is <20%. Left ventricular ejection fraction by PLAX is 20 %. Left ventricular ejection fraction by 2D MOD biplane is 23.9 %. The left ventricle has severely decreased function. The left ventricle demonstrates global hypokinesis. The left ventricular internal cavity size was moderately to severely dilated. There is mild left ventricular hypertrophy. Left ventricular diastolic parameters are consistent with Grade III diastolic dysfunction  (restrictive). Right Ventricle: Severe RV pressure and volume overload. RVSP falsely low due to equalization of pressures. The right ventricular size is severely enlarged. No increase in right ventricular wall thickness. Right ventricular systolic function is severely reduced. There is normal pulmonary artery systolic pressure. The  tricuspid regurgitant velocity is 2.18 m/s, and with an assumed right atrial pressure of 15 mmHg, the estimated right ventricular systolic pressure is 34.0 mmHg. Left Atrium: Left atrial size was severely dilated. Right Atrium:  [facility] CLINIC CARDIOLOGY CONSULT NOTE       Patient ID: Taylor [name] MRN: [id number] DOB/AGE: September 09, 1941 82 y.o.  Admit date: 09/10/2023 Referring Physician Dr. Zada Girt Primary Physician Sherrie Mustache, Demetrios Isaacs, MD  Primary Cardiologist Dr. Marcina Millard Reason for Consultation Chronic Heart Failure, EF < 20%  HPI: Taylor [name] is a 82 y.o. male  with a past medical history of paroxysmal atrial fibrillation (Eliquis), chronic HFrEF (EF < 20%), s/p CABG (08/2006), s/p dual-chamber ICD (2014), SSS, HTN, HLD, type 2 diabetes, pancreatitis, ischemic cardiomyopathy, ventricular fibrillation arrest (1994) s/p MI infarction, bilateral carotid artery stenosis s/p left carotid stent (06/2022), abdominal aortic aneurysm s/p endovascular repair (2008), CKD stage 4, COPD (baseline 2L [place]) liver cirrhosis with ascites (CT 08/2023), who presented to the ED on 09/10/2023 for frequent falls and generalized weakness. Cardiology was consulted for further evaluation.   Patient reports he feels overall fatigued with mild shortness of breath. Patient denies chest pain/pressure or palpitations. Patient denies any lower extremity swelling. He reports generalized weakness and falling more frequently. Patient was seen in the ED on 08/29/2023 and was found to have a left radial styloid fracture and displaced fractures of bilateral nasal bones and frontal process of left maxilla and left forehead and periorbital hematoma. Patient currently has a left wrist brace on. Work-up in the ED notable for Na 135, K 6.8, Cr 5.46 (baseline of 2.8), Phos 6.5, Mg 2.8, GFR 10, BNP 647.1, Trop trend 19 > 15, Lactic acid trend 3.8 >2.9 > 2.7 > 2.5 > 3.4, procal 1.13, WBC trend 13.9 > 9.7, Hgb 12.3 > 11.0, platelets 133 > 129. Patient was hypotensive with SBP in 80s and hypothermic at 94.1. CT of chest/abd/pelvis showed new patchy airspace in right middle and left lower lobe with moderate right sided pleural effusion. In the ED, patient  received fluids, solu-cortef, midodrine and one dose of Levaquin.  At the time of my evaluation this afternoon, patient was laying in his hospital bed at an incline with wife at bedside. Patient states he feels fatigued with mild shortness of breath. Patient is on 2L [place] and reports that is his baseline at home. Patient denies any chest pain, palpitations, dizziness, or lower extremity edema. Patient reports he's mostly concerned about his "body pain" after falling.   History given by patient and patient's wife also confirmed history.  Review of systems complete and found to be negative unless listed above    Past Medical History:  Diagnosis Date   AAA (abdominal aortic aneurysm) (HCC) 06/03/2007   [facility]; Dr. Hart [place]   AICD (automatic cardioverter/defibrillator) present    Arrhythmia    atrial fibrillation   Barrett's esophagus    Bladder cancer (HCC)    Bradycardia    CAD (coronary artery disease)    CAP (community acquired pneumonia) 11/13/2019   CHF (congestive heart failure) (HCC)    Cluster headache    COVID-19 09/27/2021   DDD (degenerative disc disease), lumbar    Diabetes mellitus without complication (HCC)    Dyspnea    WITH EXERTION   Edema    LEFT ANKLE   Fracture of skull base (HCC) 1997   due to fall   GERD (gastroesophageal reflux disease)    Gout    History of bladder cancer 12/1995   Hyperlipidemia    Hypertension    Hypocalcemia 04/19/2020   Possibly secondary to diuretics.   Low=5.9 04/19/2020   Malignant melanoma (HCC) 12/2012   right dorsal  70.0 in Accession #:    [id number]   Weight:       158.3 lb Date of Birth:  06/18/1941   BSA:          1.890 m Patient Age:    81 years     BP:           103/69 mmHg Patient Gender: M            HR:           62 bpm. Exam Location:  [facility] Procedure: 2D Echo, 3D Echo, Cardiac Doppler and Color Doppler Indications:     Syncope  History:         Patient has prior history of Echocardiogram examinations, most                  recent 10/30/2022. CHF and Cardiomyopathy, CAD and Previous                  Myocardial Infarction, Pacemaker and Defibrillator, Stroke,                  Arrythmias:Atrial Fibrillation and Bradycardia,                  Signs/Symptoms:Syncope and Dyspnea; Risk Factors:Hypertension                  and Diabetes. AAA, CKD.  Sonographer:     Mikki Harbor Referring Phys:  [id number] BRITTON L RUST-CHESTER Diagnosing Phys: Rozell Searing Custovic IMPRESSIONS  1. Left ventricular ejection fraction, by estimation, is <20%. Left ventricular ejection fraction by 2D MOD biplane is 23.9 %. Left ventricular  ejection fraction by PLAX is 20 %. The left ventricle has severely decreased function. The left ventricle demonstrates global hypokinesis. The left ventricular internal cavity size was moderately to severely dilated. There is mild left ventricular hypertrophy. Left ventricular diastolic parameters are consistent with Grade III diastolic dysfunction (restrictive).  2. Severe RV pressure and volume overload. RVSP falsely low due to equalization of pressures. Right ventricular systolic function is severely reduced. The right ventricular size is severely enlarged. There is normal pulmonary artery systolic pressure. The estimated right ventricular systolic pressure is 34.0 mmHg.  3. Left atrial size was severely dilated.  4. Right atrial size was severely dilated.  5. The mitral valve is degenerative. Moderate to severe mitral valve regurgitation.  6. The tricuspid valve is degenerative. Tricuspid valve regurgitation is severe.  7. The aortic valve is grossly normal. Aortic valve regurgitation is mild. Aortic valve sclerosis is present, with no evidence of aortic valve stenosis. FINDINGS  Left Ventricle: Left ventricular ejection fraction, by estimation, is <20%. Left ventricular ejection fraction by PLAX is 20 %. Left ventricular ejection fraction by 2D MOD biplane is 23.9 %. The left ventricle has severely decreased function. The left ventricle demonstrates global hypokinesis. The left ventricular internal cavity size was moderately to severely dilated. There is mild left ventricular hypertrophy. Left ventricular diastolic parameters are consistent with Grade III diastolic dysfunction  (restrictive). Right Ventricle: Severe RV pressure and volume overload. RVSP falsely low due to equalization of pressures. The right ventricular size is severely enlarged. No increase in right ventricular wall thickness. Right ventricular systolic function is severely reduced. There is normal pulmonary artery systolic pressure. The  tricuspid regurgitant velocity is 2.18 m/s, and with an assumed right atrial pressure of 15 mmHg, the estimated right ventricular systolic pressure is 34.0 mmHg. Left Atrium: Left atrial size was severely dilated. Right Atrium:  [facility] CLINIC CARDIOLOGY CONSULT NOTE       Patient ID: Taylor [name] MRN: [id number] DOB/AGE: September 09, 1941 82 y.o.  Admit date: 09/10/2023 Referring Physician Dr. Zada Girt Primary Physician Sherrie Mustache, Demetrios Isaacs, MD  Primary Cardiologist Dr. Marcina Millard Reason for Consultation Chronic Heart Failure, EF < 20%  HPI: Taylor [name] is a 82 y.o. male  with a past medical history of paroxysmal atrial fibrillation (Eliquis), chronic HFrEF (EF < 20%), s/p CABG (08/2006), s/p dual-chamber ICD (2014), SSS, HTN, HLD, type 2 diabetes, pancreatitis, ischemic cardiomyopathy, ventricular fibrillation arrest (1994) s/p MI infarction, bilateral carotid artery stenosis s/p left carotid stent (06/2022), abdominal aortic aneurysm s/p endovascular repair (2008), CKD stage 4, COPD (baseline 2L [place]) liver cirrhosis with ascites (CT 08/2023), who presented to the ED on 09/10/2023 for frequent falls and generalized weakness. Cardiology was consulted for further evaluation.   Patient reports he feels overall fatigued with mild shortness of breath. Patient denies chest pain/pressure or palpitations. Patient denies any lower extremity swelling. He reports generalized weakness and falling more frequently. Patient was seen in the ED on 08/29/2023 and was found to have a left radial styloid fracture and displaced fractures of bilateral nasal bones and frontal process of left maxilla and left forehead and periorbital hematoma. Patient currently has a left wrist brace on. Work-up in the ED notable for Na 135, K 6.8, Cr 5.46 (baseline of 2.8), Phos 6.5, Mg 2.8, GFR 10, BNP 647.1, Trop trend 19 > 15, Lactic acid trend 3.8 >2.9 > 2.7 > 2.5 > 3.4, procal 1.13, WBC trend 13.9 > 9.7, Hgb 12.3 > 11.0, platelets 133 > 129. Patient was hypotensive with SBP in 80s and hypothermic at 94.1. CT of chest/abd/pelvis showed new patchy airspace in right middle and left lower lobe with moderate right sided pleural effusion. In the ED, patient  received fluids, solu-cortef, midodrine and one dose of Levaquin.  At the time of my evaluation this afternoon, patient was laying in his hospital bed at an incline with wife at bedside. Patient states he feels fatigued with mild shortness of breath. Patient is on 2L [place] and reports that is his baseline at home. Patient denies any chest pain, palpitations, dizziness, or lower extremity edema. Patient reports he's mostly concerned about his "body pain" after falling.   History given by patient and patient's wife also confirmed history.  Review of systems complete and found to be negative unless listed above    Past Medical History:  Diagnosis Date   AAA (abdominal aortic aneurysm) (HCC) 06/03/2007   [facility]; Dr. Hart [place]   AICD (automatic cardioverter/defibrillator) present    Arrhythmia    atrial fibrillation   Barrett's esophagus    Bladder cancer (HCC)    Bradycardia    CAD (coronary artery disease)    CAP (community acquired pneumonia) 11/13/2019   CHF (congestive heart failure) (HCC)    Cluster headache    COVID-19 09/27/2021   DDD (degenerative disc disease), lumbar    Diabetes mellitus without complication (HCC)    Dyspnea    WITH EXERTION   Edema    LEFT ANKLE   Fracture of skull base (HCC) 1997   due to fall   GERD (gastroesophageal reflux disease)    Gout    History of bladder cancer 12/1995   Hyperlipidemia    Hypertension    Hypocalcemia 04/19/2020   Possibly secondary to diuretics.   Low=5.9 04/19/2020   Malignant melanoma (HCC) 12/2012   right dorsal  70.0 in Accession #:    [id number]   Weight:       158.3 lb Date of Birth:  06/18/1941   BSA:          1.890 m Patient Age:    81 years     BP:           103/69 mmHg Patient Gender: M            HR:           62 bpm. Exam Location:  [facility] Procedure: 2D Echo, 3D Echo, Cardiac Doppler and Color Doppler Indications:     Syncope  History:         Patient has prior history of Echocardiogram examinations, most                  recent 10/30/2022. CHF and Cardiomyopathy, CAD and Previous                  Myocardial Infarction, Pacemaker and Defibrillator, Stroke,                  Arrythmias:Atrial Fibrillation and Bradycardia,                  Signs/Symptoms:Syncope and Dyspnea; Risk Factors:Hypertension                  and Diabetes. AAA, CKD.  Sonographer:     Mikki Harbor Referring Phys:  [id number] BRITTON L RUST-CHESTER Diagnosing Phys: Rozell Searing Custovic IMPRESSIONS  1. Left ventricular ejection fraction, by estimation, is <20%. Left ventricular ejection fraction by 2D MOD biplane is 23.9 %. Left ventricular  ejection fraction by PLAX is 20 %. The left ventricle has severely decreased function. The left ventricle demonstrates global hypokinesis. The left ventricular internal cavity size was moderately to severely dilated. There is mild left ventricular hypertrophy. Left ventricular diastolic parameters are consistent with Grade III diastolic dysfunction (restrictive).  2. Severe RV pressure and volume overload. RVSP falsely low due to equalization of pressures. Right ventricular systolic function is severely reduced. The right ventricular size is severely enlarged. There is normal pulmonary artery systolic pressure. The estimated right ventricular systolic pressure is 34.0 mmHg.  3. Left atrial size was severely dilated.  4. Right atrial size was severely dilated.  5. The mitral valve is degenerative. Moderate to severe mitral valve regurgitation.  6. The tricuspid valve is degenerative. Tricuspid valve regurgitation is severe.  7. The aortic valve is grossly normal. Aortic valve regurgitation is mild. Aortic valve sclerosis is present, with no evidence of aortic valve stenosis. FINDINGS  Left Ventricle: Left ventricular ejection fraction, by estimation, is <20%. Left ventricular ejection fraction by PLAX is 20 %. Left ventricular ejection fraction by 2D MOD biplane is 23.9 %. The left ventricle has severely decreased function. The left ventricle demonstrates global hypokinesis. The left ventricular internal cavity size was moderately to severely dilated. There is mild left ventricular hypertrophy. Left ventricular diastolic parameters are consistent with Grade III diastolic dysfunction  (restrictive). Right Ventricle: Severe RV pressure and volume overload. RVSP falsely low due to equalization of pressures. The right ventricular size is severely enlarged. No increase in right ventricular wall thickness. Right ventricular systolic function is severely reduced. There is normal pulmonary artery systolic pressure. The  tricuspid regurgitant velocity is 2.18 m/s, and with an assumed right atrial pressure of 15 mmHg, the estimated right ventricular systolic pressure is 34.0 mmHg. Left Atrium: Left atrial size was severely dilated. Right Atrium:  [facility] CLINIC CARDIOLOGY CONSULT NOTE       Patient ID: Taylor [name] MRN: [id number] DOB/AGE: September 09, 1941 82 y.o.  Admit date: 09/10/2023 Referring Physician Dr. Zada Girt Primary Physician Sherrie Mustache, Demetrios Isaacs, MD  Primary Cardiologist Dr. Marcina Millard Reason for Consultation Chronic Heart Failure, EF < 20%  HPI: Taylor [name] is a 82 y.o. male  with a past medical history of paroxysmal atrial fibrillation (Eliquis), chronic HFrEF (EF < 20%), s/p CABG (08/2006), s/p dual-chamber ICD (2014), SSS, HTN, HLD, type 2 diabetes, pancreatitis, ischemic cardiomyopathy, ventricular fibrillation arrest (1994) s/p MI infarction, bilateral carotid artery stenosis s/p left carotid stent (06/2022), abdominal aortic aneurysm s/p endovascular repair (2008), CKD stage 4, COPD (baseline 2L [place]) liver cirrhosis with ascites (CT 08/2023), who presented to the ED on 09/10/2023 for frequent falls and generalized weakness. Cardiology was consulted for further evaluation.   Patient reports he feels overall fatigued with mild shortness of breath. Patient denies chest pain/pressure or palpitations. Patient denies any lower extremity swelling. He reports generalized weakness and falling more frequently. Patient was seen in the ED on 08/29/2023 and was found to have a left radial styloid fracture and displaced fractures of bilateral nasal bones and frontal process of left maxilla and left forehead and periorbital hematoma. Patient currently has a left wrist brace on. Work-up in the ED notable for Na 135, K 6.8, Cr 5.46 (baseline of 2.8), Phos 6.5, Mg 2.8, GFR 10, BNP 647.1, Trop trend 19 > 15, Lactic acid trend 3.8 >2.9 > 2.7 > 2.5 > 3.4, procal 1.13, WBC trend 13.9 > 9.7, Hgb 12.3 > 11.0, platelets 133 > 129. Patient was hypotensive with SBP in 80s and hypothermic at 94.1. CT of chest/abd/pelvis showed new patchy airspace in right middle and left lower lobe with moderate right sided pleural effusion. In the ED, patient  received fluids, solu-cortef, midodrine and one dose of Levaquin.  At the time of my evaluation this afternoon, patient was laying in his hospital bed at an incline with wife at bedside. Patient states he feels fatigued with mild shortness of breath. Patient is on 2L [place] and reports that is his baseline at home. Patient denies any chest pain, palpitations, dizziness, or lower extremity edema. Patient reports he's mostly concerned about his "body pain" after falling.   History given by patient and patient's wife also confirmed history.  Review of systems complete and found to be negative unless listed above    Past Medical History:  Diagnosis Date   AAA (abdominal aortic aneurysm) (HCC) 06/03/2007   [facility]; Dr. Hart [place]   AICD (automatic cardioverter/defibrillator) present    Arrhythmia    atrial fibrillation   Barrett's esophagus    Bladder cancer (HCC)    Bradycardia    CAD (coronary artery disease)    CAP (community acquired pneumonia) 11/13/2019   CHF (congestive heart failure) (HCC)    Cluster headache    COVID-19 09/27/2021   DDD (degenerative disc disease), lumbar    Diabetes mellitus without complication (HCC)    Dyspnea    WITH EXERTION   Edema    LEFT ANKLE   Fracture of skull base (HCC) 1997   due to fall   GERD (gastroesophageal reflux disease)    Gout    History of bladder cancer 12/1995   Hyperlipidemia    Hypertension    Hypocalcemia 04/19/2020   Possibly secondary to diuretics.   Low=5.9 04/19/2020   Malignant melanoma (HCC) 12/2012   right dorsal  70.0 in Accession #:    [id number]   Weight:       158.3 lb Date of Birth:  06/18/1941   BSA:          1.890 m Patient Age:    81 years     BP:           103/69 mmHg Patient Gender: M            HR:           62 bpm. Exam Location:  [facility] Procedure: 2D Echo, 3D Echo, Cardiac Doppler and Color Doppler Indications:     Syncope  History:         Patient has prior history of Echocardiogram examinations, most                  recent 10/30/2022. CHF and Cardiomyopathy, CAD and Previous                  Myocardial Infarction, Pacemaker and Defibrillator, Stroke,                  Arrythmias:Atrial Fibrillation and Bradycardia,                  Signs/Symptoms:Syncope and Dyspnea; Risk Factors:Hypertension                  and Diabetes. AAA, CKD.  Sonographer:     Mikki Harbor Referring Phys:  [id number] BRITTON L RUST-CHESTER Diagnosing Phys: Rozell Searing Custovic IMPRESSIONS  1. Left ventricular ejection fraction, by estimation, is <20%. Left ventricular ejection fraction by 2D MOD biplane is 23.9 %. Left ventricular  ejection fraction by PLAX is 20 %. The left ventricle has severely decreased function. The left ventricle demonstrates global hypokinesis. The left ventricular internal cavity size was moderately to severely dilated. There is mild left ventricular hypertrophy. Left ventricular diastolic parameters are consistent with Grade III diastolic dysfunction (restrictive).  2. Severe RV pressure and volume overload. RVSP falsely low due to equalization of pressures. Right ventricular systolic function is severely reduced. The right ventricular size is severely enlarged. There is normal pulmonary artery systolic pressure. The estimated right ventricular systolic pressure is 34.0 mmHg.  3. Left atrial size was severely dilated.  4. Right atrial size was severely dilated.  5. The mitral valve is degenerative. Moderate to severe mitral valve regurgitation.  6. The tricuspid valve is degenerative. Tricuspid valve regurgitation is severe.  7. The aortic valve is grossly normal. Aortic valve regurgitation is mild. Aortic valve sclerosis is present, with no evidence of aortic valve stenosis. FINDINGS  Left Ventricle: Left ventricular ejection fraction, by estimation, is <20%. Left ventricular ejection fraction by PLAX is 20 %. Left ventricular ejection fraction by 2D MOD biplane is 23.9 %. The left ventricle has severely decreased function. The left ventricle demonstrates global hypokinesis. The left ventricular internal cavity size was moderately to severely dilated. There is mild left ventricular hypertrophy. Left ventricular diastolic parameters are consistent with Grade III diastolic dysfunction  (restrictive). Right Ventricle: Severe RV pressure and volume overload. RVSP falsely low due to equalization of pressures. The right ventricular size is severely enlarged. No increase in right ventricular wall thickness. Right ventricular systolic function is severely reduced. There is normal pulmonary artery systolic pressure. The  tricuspid regurgitant velocity is 2.18 m/s, and with an assumed right atrial pressure of 15 mmHg, the estimated right ventricular systolic pressure is 34.0 mmHg. Left Atrium: Left atrial size was severely dilated. Right Atrium:  [facility] CLINIC CARDIOLOGY CONSULT NOTE       Patient ID: Taylor [name] MRN: [id number] DOB/AGE: September 09, 1941 82 y.o.  Admit date: 09/10/2023 Referring Physician Dr. Zada Girt Primary Physician Sherrie Mustache, Demetrios Isaacs, MD  Primary Cardiologist Dr. Marcina Millard Reason for Consultation Chronic Heart Failure, EF < 20%  HPI: Taylor [name] is a 82 y.o. male  with a past medical history of paroxysmal atrial fibrillation (Eliquis), chronic HFrEF (EF < 20%), s/p CABG (08/2006), s/p dual-chamber ICD (2014), SSS, HTN, HLD, type 2 diabetes, pancreatitis, ischemic cardiomyopathy, ventricular fibrillation arrest (1994) s/p MI infarction, bilateral carotid artery stenosis s/p left carotid stent (06/2022), abdominal aortic aneurysm s/p endovascular repair (2008), CKD stage 4, COPD (baseline 2L [place]) liver cirrhosis with ascites (CT 08/2023), who presented to the ED on 09/10/2023 for frequent falls and generalized weakness. Cardiology was consulted for further evaluation.   Patient reports he feels overall fatigued with mild shortness of breath. Patient denies chest pain/pressure or palpitations. Patient denies any lower extremity swelling. He reports generalized weakness and falling more frequently. Patient was seen in the ED on 08/29/2023 and was found to have a left radial styloid fracture and displaced fractures of bilateral nasal bones and frontal process of left maxilla and left forehead and periorbital hematoma. Patient currently has a left wrist brace on. Work-up in the ED notable for Na 135, K 6.8, Cr 5.46 (baseline of 2.8), Phos 6.5, Mg 2.8, GFR 10, BNP 647.1, Trop trend 19 > 15, Lactic acid trend 3.8 >2.9 > 2.7 > 2.5 > 3.4, procal 1.13, WBC trend 13.9 > 9.7, Hgb 12.3 > 11.0, platelets 133 > 129. Patient was hypotensive with SBP in 80s and hypothermic at 94.1. CT of chest/abd/pelvis showed new patchy airspace in right middle and left lower lobe with moderate right sided pleural effusion. In the ED, patient  received fluids, solu-cortef, midodrine and one dose of Levaquin.  At the time of my evaluation this afternoon, patient was laying in his hospital bed at an incline with wife at bedside. Patient states he feels fatigued with mild shortness of breath. Patient is on 2L [place] and reports that is his baseline at home. Patient denies any chest pain, palpitations, dizziness, or lower extremity edema. Patient reports he's mostly concerned about his "body pain" after falling.   History given by patient and patient's wife also confirmed history.  Review of systems complete and found to be negative unless listed above    Past Medical History:  Diagnosis Date   AAA (abdominal aortic aneurysm) (HCC) 06/03/2007   [facility]; Dr. Hart [place]   AICD (automatic cardioverter/defibrillator) present    Arrhythmia    atrial fibrillation   Barrett's esophagus    Bladder cancer (HCC)    Bradycardia    CAD (coronary artery disease)    CAP (community acquired pneumonia) 11/13/2019   CHF (congestive heart failure) (HCC)    Cluster headache    COVID-19 09/27/2021   DDD (degenerative disc disease), lumbar    Diabetes mellitus without complication (HCC)    Dyspnea    WITH EXERTION   Edema    LEFT ANKLE   Fracture of skull base (HCC) 1997   due to fall   GERD (gastroesophageal reflux disease)    Gout    History of bladder cancer 12/1995   Hyperlipidemia    Hypertension    Hypocalcemia 04/19/2020   Possibly secondary to diuretics.   Low=5.9 04/19/2020   Malignant melanoma (HCC) 12/2012   right dorsal  [facility] CLINIC CARDIOLOGY CONSULT NOTE       Patient ID: Taylor [name] MRN: [id number] DOB/AGE: September 09, 1941 82 y.o.  Admit date: 09/10/2023 Referring Physician Dr. Zada Girt Primary Physician Sherrie Mustache, Demetrios Isaacs, MD  Primary Cardiologist Dr. Marcina Millard Reason for Consultation Chronic Heart Failure, EF < 20%  HPI: Taylor [name] is a 82 y.o. male  with a past medical history of paroxysmal atrial fibrillation (Eliquis), chronic HFrEF (EF < 20%), s/p CABG (08/2006), s/p dual-chamber ICD (2014), SSS, HTN, HLD, type 2 diabetes, pancreatitis, ischemic cardiomyopathy, ventricular fibrillation arrest (1994) s/p MI infarction, bilateral carotid artery stenosis s/p left carotid stent (06/2022), abdominal aortic aneurysm s/p endovascular repair (2008), CKD stage 4, COPD (baseline 2L [place]) liver cirrhosis with ascites (CT 08/2023), who presented to the ED on 09/10/2023 for frequent falls and generalized weakness. Cardiology was consulted for further evaluation.   Patient reports he feels overall fatigued with mild shortness of breath. Patient denies chest pain/pressure or palpitations. Patient denies any lower extremity swelling. He reports generalized weakness and falling more frequently. Patient was seen in the ED on 08/29/2023 and was found to have a left radial styloid fracture and displaced fractures of bilateral nasal bones and frontal process of left maxilla and left forehead and periorbital hematoma. Patient currently has a left wrist brace on. Work-up in the ED notable for Na 135, K 6.8, Cr 5.46 (baseline of 2.8), Phos 6.5, Mg 2.8, GFR 10, BNP 647.1, Trop trend 19 > 15, Lactic acid trend 3.8 >2.9 > 2.7 > 2.5 > 3.4, procal 1.13, WBC trend 13.9 > 9.7, Hgb 12.3 > 11.0, platelets 133 > 129. Patient was hypotensive with SBP in 80s and hypothermic at 94.1. CT of chest/abd/pelvis showed new patchy airspace in right middle and left lower lobe with moderate right sided pleural effusion. In the ED, patient  received fluids, solu-cortef, midodrine and one dose of Levaquin.  At the time of my evaluation this afternoon, patient was laying in his hospital bed at an incline with wife at bedside. Patient states he feels fatigued with mild shortness of breath. Patient is on 2L [place] and reports that is his baseline at home. Patient denies any chest pain, palpitations, dizziness, or lower extremity edema. Patient reports he's mostly concerned about his "body pain" after falling.   History given by patient and patient's wife also confirmed history.  Review of systems complete and found to be negative unless listed above    Past Medical History:  Diagnosis Date   AAA (abdominal aortic aneurysm) (HCC) 06/03/2007   [facility]; Dr. Hart [place]   AICD (automatic cardioverter/defibrillator) present    Arrhythmia    atrial fibrillation   Barrett's esophagus    Bladder cancer (HCC)    Bradycardia    CAD (coronary artery disease)    CAP (community acquired pneumonia) 11/13/2019   CHF (congestive heart failure) (HCC)    Cluster headache    COVID-19 09/27/2021   DDD (degenerative disc disease), lumbar    Diabetes mellitus without complication (HCC)    Dyspnea    WITH EXERTION   Edema    LEFT ANKLE   Fracture of skull base (HCC) 1997   due to fall   GERD (gastroesophageal reflux disease)    Gout    History of bladder cancer 12/1995   Hyperlipidemia    Hypertension    Hypocalcemia 04/19/2020   Possibly secondary to diuretics.   Low=5.9 04/19/2020   Malignant melanoma (HCC) 12/2012   right dorsal

## 2023-09-11 NOTE — Consult Note (Signed)
Consultation Note Date: 09/11/2023 at 1200  Patient Name: Taylor [name]  DOB: 07/22/1941  MRN: [id number]  Age / Sex: 82 y.o., male  PCP: Malva Limes, MD Referring Physician: Sunnie Nielsen, DO  HPI/Patient Profile: 82 y.o. male  with past medical history of systolic CHF (EF less than 20%), CKD (stage IV), HTN, HLD, type 2 diabetes, COPD (2L supplemental oxygen), CAD with stent and history of CABG, stroke, A-fib (Eliquis), PPM with ICD, melanoma, psoriatic arthritis, pancreatitis, and an alcoholic liver cirrhosis admitted on 09/10/2023 with fall.   She is being treated for severe sepsis due to HCAP, acute renal failure superimposed on stage IV CKD, hyperkalemia, hyperphosphatemia, and hypotension.  PMT was consulted to discuss goals of care.  Clinical Assessment and Goals of Care: Extensive chart review completed prior to meeting patient including labs, vital signs, imaging, progress notes, orders, and available advanced directive documents from current and previous encounters. I then met with patient at bedside and then just his wife as patient had to transfer for ultrasound.  I discussed diagnosis prognosis, GOC, EOL wishes, disposition and options.  I introduced Palliative Medicine as specialized medical care for people living with serious illness. It focuses on providing relief from the symptoms and stress of a serious illness. The goal is to improve quality of life for both the patient and the family.  We discussed a brief life review of the patient.  Patient and Lura Em have been married for almost 51 years.  Patient has a daughter from a previous marriage that lives in [place].  He and Patsy have 2 sons who live on their street/just next-door.  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  She shares that since the fall, he has been sleeping at home a lot more, shuffling in his gait, and not eating as much as he once did.  We discussed patient's current illness and what it means in the larger context of patient's on-going co-morbidities.  Significant amount of time spent on education of heart failure, kidney failure, functional status, pressors, hemodialysis, and hyperkalemia.  Questions and concerns in regards to patient's current medical state addressed from [facility].  I attempted to elicit values and goals important to [facility] and patient.  Patsy shares she and Deantae have never discussed it.  He is never made his wishes known in the past to her.   Advance directives, concepts specific to code status, artificial feeding and hydration, and rehospitalization were considered and discussed.  Difference between DNR, DNI, and full CODE STATUS discussed in detail.  Patsy is not prepared to make changes to patient's plan of care at this time but shares she will think about it.  Encouraged Patsy to consider DNR/DNI status understanding evidenced based poor outcomes in similar hospitalized patients, as the cause of the arrest is likely associated with  chronic/terminal disease rather than a reversible acute cardio-pulmonary event.    Family is facing treatment option decisions, advanced directive, and anticipatory care needs.    Discussed with patient/family the importance of continued conversation with family and the medical providers regarding overall plan of care and treatment options, ensuring decisions are within the context of the patient's values and GOCs.    Full code and full scope remain.  PMT will continue to follow and support patient and family throughout his hospitalization.  Primary Decision Maker NEXT OF KIN  Physical Exam Vitals reviewed.  Constitutional:      Appearance: He is  normal weight.  HENT:     Head: Normocephalic.     Mouth/Throat:     Mouth: Mucous membranes are moist.  Eyes:     Pupils: Pupils are equal, round, and reactive to light.  Cardiovascular:     Rate and Rhythm: Normal rate.  Skin:    General: Skin is warm and dry.     Findings: Bruising present.  Neurological:     Mental Status: He is alert and oriented to person, place, and time.  Psychiatric:        Mood and Affect: Mood normal.        Behavior: Behavior normal.        Thought Content: Thought content normal.        Judgment: Judgment normal.     Palliative Assessment/Data: 50%     Thank you for this consult. Palliative medicine will continue to follow and assist holistically.   Time Total: 75 minutes  Time spent includes: Detailed review of medical records (labs, imaging, vital signs), medically appropriate exam (mental status, respiratory, cardiac, skin), discussed with treatment team, counseling and educating patient, family and staff, documenting clinical information, medication management and coordination of care.  Signed by: Georgiann Cocker, DNP, FNP-BC Palliative Medicine   Please contact Palliative Medicine Team providers via [facility] for questions and concerns.

## 2023-09-11 NOTE — Progress Notes (Signed)
NAME:  Taylor [name], MRN:  [id number], DOB:  12/20/1940, LOS: 1 ADMISSION DATE:  09/10/2023, CONSULTATION DATE:  09/10/23 REFERRING MD:  Dr. Clyde Lundborg, CHIEF COMPLAINT:  Fall   History of Present Illness:  82 yo M presenting to [facility] ED from home via EMS for evaluation after multiple falls.  History provided per chart review & bedside patient and spouse report. Patient reports being in his normal state of health until 08/29/23 when he fell in the driveway after what he described to be "tripping over the curb". Of note earlier that week his torsemide was increased back to 60 mg BID from 40 mg BID.  This fall created facial fractures that ENT has been monitoring outpatient.  Patient reports his SBP, which he checks daily, normally runs in the 130's but starting the week of 10/7 dropped into the 100's- 110's/80's. He also reports correlating fatigue, sleeping most of the day and worsening generalized weakness. He has been taking all medication as prescribed during this time, with his last administration on the morning of 10/16. In addition he reports one episode of non-bloody diarrhea, poor PO intake and "spots/floaters" in his vision, however ENT told the spouse this might be from the fall on 10/4. He then fell 3 times on 09/10/23. These he describes as feeling his body "give out", he does not think he lost consciousness however he also has no memory of the entire event. He denies abdominal pain, nausea, vomiting, dysuria, hematuria, increased dyspnea, new cough, fever/chills, blurred vision, headache or light headedness.  ED course: Upon arrival, patient alert and oriented, hypothermic with soft BP. Sepsis protocol initiated and bear hugger placed. Lab work significant for hyperkalemia, AKI on CKD stage IV, hypochloremia, mild Transaminitis, elevated INR & mildly elevated ammonia, elevated BNP, leukocytosis with left shift & lactic acidosis. Imaging concerning for pneumonia, cirrhosis and pleural effusion.  Nephrology consulted by EDP for severe AKI, recommendations given for shifting measures and sodium bicarbonate drip. TRH consulted for admission. Medications given: Levaquin, 1.75 mL IVF bolus, sodium bicarb/ Ca+ gluc/ Veltassa/ Lokelma, solu cortef, midodrine, albumin Initial Vitals: 92.1, 18, 59, 95/65 & 100% on RA Significant labs: (Labs/ Imaging personally reviewed) I, Cheryll Cockayne Rust-Chester, AGACNP-BC, personally viewed and interpreted this ECG. EKG Interpretation: Date: 09/10/23, EKG Time: 20:58, Rate: 75, Rhythm: A-fib, QRS Axis:  LAD, Intervals: BBB, prolonged Qtc, ST/T Wave abnormalities: none, Narrative Interpretation: A-fib with BBB and prolonged Qtc (chronic PPM/ICD) Chemistry: Na+:135, K+: 6.8 > 6.1, BUN/Cr.: 93/ 5.46, Serum CO2/ AG: 25/ 15, Ph: 6.5, Mg: 2.8, Alk Phos: 141, Albumin: 2.8, T. Bili: 3.1 Hematology: WBC: 13.9, Hgb: 12.3,  Troponin: 19 > 15, BNP: 647.1, Lactic/ PCT: 3.8 > 2.9/ 1.13,  COVID-19 & Influenza A/B: pending  VBG: 7.29/ 58/ 38/ 27.9 CXR 09/10/23: No acute findings. Unchanged cardiomegaly post CABG. Improvement in right pleural effusion from prior exam. DG elbow RIGHT 09/10/23: No acute fracture or dislocation of the right elbow. 2. Mild posterior soft tissue edema DG elbow LEFT 09/10/23: No fracture or dislocation of the left elbow. Mild degenerative spurring. CT maxillofacial wo contrast 09/10/23: Similar recent nasal bone fractures. No evidence of interval facial fracture.  CT head wo contrast 09/10/23: No acute intracranial abnormality.  CT cervical spine ow contrast 09/10/23: No evidence of acute fracture or traumatic malalignment in the cervical spine. CT chest/abdomen/pelvis 09/10/23: New patchy airspace disease in the right middle lobe and left lower lobe compatible with infection. Small to moderate-sized right pleural effusion. Stable moderate cardiomegaly. Stable nodular  liver contour compatible with cirrhosis. Small amount of ascites. Stable 3.3 cm  abdominal aortic aneurysm. Aortic atherosclerosis.  PCCM consulted for assistance in management and monitoring due to sepsis s/t pneumonia with septic shock+/- cardiogenic with potential need for peripheral vasopressor support.  Pertinent  Medical History  HFrEF - NYHA Class III (LVEF < 20% with global hypokinesis ICM s/p CABG 2007 (V-fib arrest 1994) AAA s/p endovascular repair 2008 CKD Stage IV CVA Bilateral carotid stenosis HTN HLD T2DM COPD on chronic 2 L Fletcher OSA PPM (2007) with insertion of ICD generator with existing leads (2014) Former tobacco use (quit 23 years ago, 60 pack year smoking history) CAD Atrial Fibrillation on Eliquis  Significant Hospital Events: Including procedures, antibiotic start and stop dates in addition to other pertinent events   09/10/23: Admit to SDU with sepsis s/t pneumonia in the setting of acute on chronic HFrEF and AKI on CKD IV. Nephrology & PCCM consulted. Potential need for peripheral vasopressors 09/11/23: Pt no longer requiring neo-synephrine gtt maintaining map at 65 or higher.  Sodium bicarb gtt infusing.  Nephrology consulted   Interim History / Subjective:  Pt awake with some confusion.  States he is hearing voices occasionally and reports this started after his fall on 10/4  Objective   Blood pressure 103/69, pulse 61, temperature 97.7 F (36.5 C), temperature source Oral, resp. rate 20, height 5\' 10"  (1.778 m), weight 71.8 kg, SpO2 94%.        Intake/Output Summary (Last 24 hours) at 09/11/2023 0759 Last data filed at 09/11/2023 0700 Gross per 24 hour  Intake 977.44 ml  Output 150 ml  Net 827.44 ml   Filed Weights   09/10/23 1552 09/10/23 2300 09/11/23 0500  Weight: 68 kg 71.8 kg 71.8 kg    Examination: General: Acute on chronically-ill appearing male, NAD resting in bed on 2L O2  HEENT: Supple, no JVD, chronic psoriasis, healing trauma to face from fall PTA Neuro: A&O x 4 with some intermittent auditory hallucinations,  able to follow commands, PERRLA CV: Irregular irregular, no m/r/g, 2+ radial /2+ distal pulses, no edema Pulm: Diminished throughout, even, non labored  GI: +BS x4, distended suspect due to some ascites, non tender, obese  Skin: Chronic psoriasis, healing facial abrasions from previous fall Extremities: Normal bulk and tone, moves all extremities   Resolved Hospital Problem list     Assessment & Plan:   #Acute metabolic encephalopathy suspect secondary to uremia  CT Head 10/16: No acute intracranial abnormality.  Similar recent nasal bone fractures. No evidence of interval facial fracture. No evidence of acute fracture or traumatic malalignment in the cervical spine.  - Correct metabolic derangements  - Avoid sedating medications as able  - Trend ammonia   #Chronic HFrEF exacerbation (LVEF < 20%) #PAF on Eliquis - controlled #Circulatory Shock multifactorial s/t sepsis +/- hypovolemia, cardiogenic, hepatorenal syndrome PMHx: HFrEF, ICM, PPM with ICD, and HTN - Continuous telemetry monitoring  - Hold outpatient antihypertensives and diuretic therapy  - Continue scheduled midodrine and prn neo-synephrine gtt to maintain map 65 or higher  - Echo results pending  - Eliquis held by primary due to recent falls - Daily weights to assess volume status - Will consult Cardiology appreciate input   #Syncope with multiple falls - Continue midodrine - Echo pending  - PT/OT consulted appreciate input  - Fall precautions   #Acute renal failure superimposed on CKD Stage IV suspect pre-renal secondary to hypovolemia & sepsis #Hyperkalemia - Trend BMP  - Replace electrolytes  as indicated  - Strict intake/output - Continue shifting measures for hyperkalemia for now   - Nephrology consulted appreciate input: pt may require dialysis   #Chronic COPD  #OSA PMHx: former tobacco use - Supplemental oxygen as needed, maintain SpO2 > 88% - Continue Bronchodilators as needed - Patient has not  been wearing his CPAP at home due to ill-fitting equipment, and the last time he tried post fall his nose began to bleed. Hold CPAP for now due to recent facial fractures, adjust oxygen requirements with [place]/face mask PRN  #Possible sepsis  - Trend WBC and monitor fever curve  - Trend PCT  - Follow cultures  - No signs of infection will discontinue abx therapy for now.  If pt becomes febrile and/or pct trends up will restart abx   #Non-alcoholic cirrhosis (new nodular live finding this admission) #Elevated INR suspect secondary to cirrhosis  #Mildly elevated ammonia - Trend hepatic function and coags  - Continue lactulose for now  - Avoid hepatotoxic medications as able; will hold lipitor for now  - Will need outpatient GI follow-up   Type 2 Diabetes Mellitus Hemoglobin A1C: 6.6 (9/24) - Monitor CBG AC, HS - SSI sensitive dosing, hold outpatient Jardiance - target range while in ICU: 140-180 - follow ICU hyper/hypo-glycemia protocol  Best Practice (right click and "Reselect all SmartList Selections" daily)  Diet/type: Regular consistency (see orders) DVT prophylaxis: SCD, subcutaneous heparin  GI prophylaxis: PPI Lines: N/A Foley:  N/A Code Status:  full code Last date of multidisciplinary goals of care discussion [09/11/23]  10/17: Updated pt, pts spouse, and son regarding pts condition and current plan of care  Labs   CBC: Recent Labs  Lab 09/10/23 1606 09/11/23 0330  WBC 13.9* 9.7  NEUTROABS 12.0*  --   HGB 12.3* 11.0*  HCT 40.2 35.3*  MCV 101.0* 98.9  PLT 133* 129*    Basic Metabolic Panel: Recent Labs  Lab 09/10/23 1606 09/10/23 1801 09/11/23 0006 09/11/23 0330  NA 135  --   --  132*  K 6.8* 6.1* 6.2* 6.0*  CL 95*  --   --  95*  CO2 25  --   --  24  GLUCOSE 173*  --   --  90  BUN 93*  --   --  99*  CREATININE 5.46*  --   --  4.95*  CALCIUM 8.9  --   --  8.7*  MG 2.8*  --   --  2.3  PHOS 6.5*  --   --  5.9*   GFR: Estimated Creatinine Clearance:  12.1 mL/min (A) (by C-G formula based on SCr of 4.95 mg/dL (H)). Recent Labs  Lab 09/10/23 1606 09/10/23 1809 09/10/23 2050 09/11/23 0006 09/11/23 0330  PROCALCITON 1.13  --   --   --  1.24  WBC 13.9*  --   --   --  9.7  LATICACIDVEN 3.8* 2.9* 3.4* 2.7* 2.5*    Liver Function Tests: Recent Labs  Lab 09/10/23 1606 09/11/23 0330  AST 28 23  ALT 10 9  ALKPHOS 141* 119  BILITOT 3.1* 2.6*  PROT 7.9 7.1  ALBUMIN 2.8* 2.8*   No results for input(s): "LIPASE", "AMYLASE" in the last 168 hours. Recent Labs  Lab 09/10/23 2138  AMMONIA 42*    ABG    Component Value Date/Time   HCO3 27.9 09/10/2023 1601   O2SAT 47.6 09/10/2023 1601     Coagulation Profile: Recent Labs  Lab 09/10/23 1606 09/11/23 0330  INR  3.0* 3.0*    Cardiac Enzymes: Recent Labs  Lab 09/10/23 1606  CKTOTAL 52    HbA1C: Hemoglobin A1C  Date/Time Value Ref Range Status  08/15/2023 02:04 PM 6.6 (A) 4.0 - 5.6 % Final   Hgb A1c MFr Bld  Date/Time Value Ref Range Status  09/11/2023 12:06 AM 6.6 (H) 4.8 - 5.6 % Final    Comment:    (NOTE) Pre diabetes:          5.7%-6.4%  Diabetes:              >6.4%  Glycemic control for   <7.0% adults with diabetes   05/05/2023 02:32 PM 7.1 (H) 4.8 - 5.6 % Final    Comment:             Prediabetes: 5.7 - 6.4          Diabetes: >6.4          Glycemic control for adults with diabetes: <7.0     CBG: Recent Labs  Lab 09/10/23 2120 09/10/23 2258 09/11/23 0731  GLUCAP 126* 102* 189*    Review of Systems: Positives in BOLD  Gen: fever, chills, weight change, fatigue, night sweats HEENT: Denies blurred vision, double vision, hearing loss, tinnitus, sinus congestion, rhinorrhea, sore throat, neck stiffness, dysphagia PULM: Denies shortness of breath, cough, sputum production, hemoptysis, wheezing CV: Denies chest pain, edema, orthopnea, paroxysmal nocturnal dyspnea, palpitations GI: abdominal pain, nausea, vomiting, diarrhea, hematochezia, melena,  constipation, change in bowel habits GU: Denies dysuria, hematuria, polyuria, oliguria, urethral discharge Endocrine: Denies hot or cold intolerance, polyuria, polyphagia or appetite change Derm: Denies rash, dry skin, scaling or peeling skin change Heme: Denies easy bruising, bleeding, bleeding gums Neuro: headache, numbness, weakness, slurred speech, loss of memory or consciousness  Past Medical History:  He,  has a past medical history of AAA (abdominal aortic aneurysm) (HCC) (06/03/2007), AICD (automatic cardioverter/defibrillator) present, Arrhythmia, Barrett's esophagus, Bladder cancer (HCC), Bradycardia, CAD (coronary artery disease), CAP (community acquired pneumonia) (11/13/2019), CHF (congestive heart failure) (HCC), Cluster headache, COVID-19 (09/27/2021), DDD (degenerative disc disease), lumbar, Diabetes mellitus without complication (HCC), Dyspnea, Edema, Fracture of skull base (HCC) (1997), GERD (gastroesophageal reflux disease), Gout, History of bladder cancer (12/1995), Hyperlipidemia, Hypertension, Hypocalcemia (04/19/2020), Malignant melanoma (HCC) (12/2012), Myocardial infarction [facility]), Osteoarthritis of knee, Other specified complication of vascular prosthetic devices, implants and grafts, initial encounter (HCC) (08/22/2021), Pacemaker (10/10/2006), Pancreatitis (11/22/2019), Pneumonia, Pre-diabetes, Psoriasis, Rib fracture (1997), Sleep apnea, Stroke (HCC), and Venous incompetence.   Surgical History:   Past Surgical History:  Procedure Laterality Date   ABDOMINAL AORTIC ANEURYSM REPAIR  06/03/2007   [facility]; Dr. Hart [place]   ANGIOPLASTY  1994   [place]   BLADDER TUMOR EXCISION  12/1995   CAROTID PTA/STENT INTERVENTION Left 07/23/2022   Procedure: CAROTID PTA/STENT INTERVENTION;  Surgeon: Renford Dills, MD;  Location: [facility] INVASIVE CV LAB;  Service: Cardiovascular;  Laterality: Left;   CATARACT EXTRACTION W/PHACO Left 10/22/2016   Procedure: CATARACT  EXTRACTION PHACO AND INTRAOCULAR LENS PLACEMENT (IOC);  Surgeon: Galen Manila, MD;  Location: [facility] ORS;  Service: Ophthalmology;  Laterality: Left;  [place] 47.7 AP% 18.4 CDE 8.78 Fluid pack lot # [id number]   CATARACT EXTRACTION W/PHACO Right 12/10/2016   Procedure: CATARACT EXTRACTION PHACO AND INTRAOCULAR LENS PLACEMENT (IOC);  Surgeon: Galen Manila, MD;  Location: [facility] ORS;  Service: Ophthalmology;  Laterality: Right;  [place] 00:39 AP% 23.3 CDE 9.13 Fluid pack lot # [id number]   CORONARY ANGIOPLASTY     STENTS X  5   CORONARY ARTERY BYPASS GRAFT  09/22/2006   four   ELBOW BURSA SURGERY     DUE TO GOUT   INSERT / REPLACE / REMOVE PACEMAKER     MELANOMA EXCISION  12/2012   Right forearm   PPM GENERATOR CHANGEOUT N/A 02/13/2023   Procedure: PPM GENERATOR CHANGEOUT;  Surgeon: Marcina Millard, MD;  Location: [facility] INVASIVE CV LAB;  Service: Cardiovascular;  Laterality: N/A;     Social History:   reports that he quit smoking about 23 years ago. His smoking use included cigarettes. He started smoking about 53 years ago. He has a 30 pack-year smoking history. He has never used smokeless tobacco. He reports that he does not drink alcohol and does not use drugs.   Family History:  His family history includes Arthritis in his brother; Cancer in his father and mother; Heart attack (age of onset: 98) in his father.   Allergies Allergies  Allergen Reactions   Amlodipine Besylate Swelling    Had a reaction when taking with colcrys    Crestor [Rosuvastatin]     Muscle cramps and pain. Tolerates atorvastatin     Home Medications  Prior to Admission medications   Medication Sig Start Date End Date Taking? Authorizing Provider  acetaminophen (TYLENOL) 500 MG tablet Take 500 mg by mouth every 6 (six) hours as needed for mild pain.    [provider]  albuterol (VENTOLIN HFA) 108 (90 Base) MCG/ACT inhaler Inhale 2 puffs into the lungs every 6 (six) hours as needed for wheezing or  shortness of breath. 01/03/23   Malva Limes, MD  apixaban (ELIQUIS) 2.5 MG TABS tablet Take 1 tablet (2.5 mg total) by mouth 2 (two) times daily. Patient taking differently: Take 5 mg by mouth 2 (two) times daily. 04/29/23   Arnetha Courser, MD  aspirin 81 MG EC tablet Take 81 mg by mouth daily.     [provider]  atorvastatin (LIPITOR) 40 MG tablet TAKE 1 TABLET(40 MG) BY MOUTH DAILY 05/13/23   Malva Limes, MD  carvedilol (COREG) 6.25 MG tablet Take 1 tablet (6.25 mg total) by mouth 2 (two) times daily with a meal. Patient taking differently: Take 12.5 mg by mouth 2 (two) times daily with a meal. 02/27/23   Sunnie Nielsen, DO  ipratropium-albuterol (DUONEB) 0.5-2.5 (3) MG/3ML SOLN Inhale 3 mLs into the lungs every 4 (four) hours as needed (wheezing, shob). 04/29/23 08/29/23  Arnetha Courser, MD  isosorbide mononitrate (IMDUR) 30 MG 24 hr tablet TAKE 1/2 TABLET(15 MG) BY MOUTH DAILY 08/18/23   Malva Limes, MD  JARDIANCE 10 MG TABS tablet Take 10 mg by mouth daily.    [provider]  Magnesium 200 MG TABS Take 1 tablet (200 mg total) by mouth in the morning and at bedtime. 11/11/22   Delma Freeze, FNP  pantoprazole (PROTONIX) 40 MG tablet TAKE 1 TABLET BY MOUTH EVERY DAY 11/21/22   Malva Limes, MD  potassium chloride SA (KLOR-CON M) 20 MEQ tablet Take 2 tablets (40 mEq total) by mouth daily. Patient taking differently: Take 20 mEq by mouth daily. 08/15/23   Malva Limes, MD  spironolactone (ALDACTONE) 25 MG tablet Take 1 tablet (25 mg total) by mouth daily. Patient not taking: Reported on 09/04/2023 04/30/23   Arnetha Courser, MD  torsemide (DEMADEX) 20 MG tablet Take 3 tablets (60 mg total) by mouth 2 (two) times daily. Patient taking differently: Take 40 mg by mouth 2 (two) times daily.  07/21/23 10/19/23  Delma Freeze, FNP  traMADol (ULTRAM) 50 MG tablet Take 1 tablet (50 mg total) by mouth every 6 (six) hours as needed. 08/29/23   Cathren Laine, MD  triamcinolone  ointment (KENALOG) 0.1 % APPLY TOPICALLY TWICE DAILY AS DIRECTED 07/15/22   Malva Limes, MD     Critical care time: 55 minutes     Zada Girt, AGNP  Pulmonary/Critical Care Pager [phone] (please enter 7 digits) PCCM Consult Pager [phone] (please enter 7 digits)

## 2023-09-11 NOTE — Evaluation (Signed)
Physical Therapy Evaluation Patient Details Name: Taylor [name] MRN: [id number] DOB: 05-18-1941 Today's Date: 09/11/2023  History of Present Illness  Taylor [name] is a 82 y.o. male with medical history significant of sCHF with EF < 20%, CKD-4, HTN, HLD, DM, COPD on 2L O2, CAD with stent and hx of CABG, stroke, A fib on Eliquis, s/p of AICD, melanoma, psoriatic arthritis, pancreatitis, liver cirrhosis with ascites (shown by recent CT scan on 08/29/23), who presents with fall.  Patient was seen in ED on 08/29/2023 due to fall.  Patient was found to have minimally displaced and comminuted radial styloid fracture. Pt was started on splint to left forearm.  CT of the maxillofacial images on 08/29/23 showed minimally displaced and comminuted radial styloid fracture.  Clinical Impression  Pt was pleasant and motivated to participate during the session and put forth good effort throughout. Seen pt as Co-treat with OT to ensure safety and optimal mobility for the pt, and to address functional needs. Pt found seated EOB finishing up eating upon entry to the room with family present. For STS transfers pt needing Mod A at most to come to full stand, needing +2 for line/lead management. Pt able to perform x2 STS's, with initial STS pt reporting feeling slightly lightheaded, no imbalance noted once fulling upright. Pt able to perform additional STS but time spent standing was brief compared to initial stand; pt reported feeling fatigued and rating today's mobility as "hard." Deferred further mobility today due to pt's fatigue. Pt will benefit from continued PT services upon discharge to safely address deficits listed in patient problem list for decreased caregiver assistance and eventual return to PLOF.       If plan is discharge home, recommend the following: A little help with walking and/or transfers;A little help with bathing/dressing/bathroom;Assistance with cooking/housework;Assist for transportation;Help with  stairs or ramp for entrance   Can travel by private vehicle        Equipment Recommendations Other (comment) (Platform walker (LUE))  Recommendations for Other Services       Functional Status Assessment Patient has had a recent decline in their functional status and demonstrates the ability to make significant improvements in function in a reasonable and predictable amount of time.     Precautions / Restrictions Precautions Precautions: Fall Restrictions Weight Bearing Restrictions: No LUE Weight Bearing: Non weight bearing through L wrist/forearm     Mobility  Bed Mobility Overal bed mobility: Needs Assistance Bed Mobility: Sit to Supine       Sit to supine: +2 for physical assistance, Mod assist   General bed mobility comments: Pt found seated EOB at start of session, helped lay pt back down and end of session    Transfers Overall transfer level: Needs assistance Equipment used: 2 person hand held assist, 1 person hand held assist Transfers: Sit to/from Stand Sit to Stand: Min assist, Mod assist, +2 physical assistance, +2 safety/equipment           General transfer comment: Initial STS Min A via +2 HHA with pt bearing weight via elbow on LUE. Pt able to perform additional STS with Mod A via HHA. Needing +2 for lines/leads    Ambulation/Gait Ambulation/Gait assistance: Min assist, +2 safety/equipment Gait Distance (Feet): 2 Feet Assistive device: 2 person hand held assist Gait Pattern/deviations: Shuffle Gait velocity: decreased     General Gait Details: Pt able to take a few shuffling steps towards head of bed, with minimal fot clearance, upon standing pt does report  mild dizziness, BP soft  Stairs            Wheelchair Mobility     Tilt Bed    Modified Rankin (Stroke Patients Only)       Balance Overall balance assessment: Needs assistance Sitting-balance support: Feet unsupported, Single extremity supported Sitting balance-Leahy  Scale: Good       Standing balance-Leahy Scale: Poor Standing balance comment: Needs HHA to stand, unable to balance self without                             Pertinent Vitals/Pain Pain Assessment Pain Assessment: Faces Faces Pain Scale: Hurts little more Pain Location: genitourinay pain Pain Descriptors / Indicators: Grimacing, Sharp Pain Intervention(s): Monitored during session, Repositioned    Home Living Family/patient expects to be discharged to:: Private residence Living Arrangements: Spouse/significant other Available Help at Discharge: Family;Available 24 hours/day Type of Home: House Home Access: Stairs to enter Entrance Stairs-Rails: Can reach both Entrance Stairs-Number of Steps: 3   Home Layout: One level Home Equipment: Agricultural consultant (2 wheels);Adaptive equipment      Prior Function Prior Level of Function : History of Falls (last six months)             Mobility Comments: reports no AD prior to recent falls, currently RW for limited community distances ADLs Comments: Ind with ADLs     Extremity/Trunk Assessment   Upper Extremity Assessment Upper Extremity Assessment: LUE deficits/detail LUE Deficits / Details: NWB on L wrist; pt in splint    Lower Extremity Assessment Lower Extremity Assessment: Generalized weakness       Communication   Communication Communication: Hearing impairment  Cognition Arousal: Alert Behavior During Therapy: WFL for tasks assessed/performed Overall Cognitive Status: Within Functional Limits for tasks assessed                                          General Comments      Exercises     Assessment/Plan    PT Assessment Patient needs continued PT services  PT Problem List Decreased strength;Decreased coordination;Decreased range of motion;Decreased activity tolerance;Decreased balance;Decreased mobility       PT Treatment Interventions DME instruction;Balance training;Gait  training;Stair training;Functional mobility training;Therapeutic activities;Therapeutic exercise    PT Goals (Current goals can be found in the Care Plan section)  Acute Rehab PT Goals Patient Stated Goal: get back to walking PT Goal Formulation: With patient Time For Goal Achievement: 09/24/23 Potential to Achieve Goals: Fair    Frequency Min 1X/week     Co-evaluation PT/OT/SLP Co-Evaluation/Treatment: Yes Reason for Co-Treatment: For patient/therapist safety;To address functional/ADL transfers PT goals addressed during session: Mobility/safety with mobility OT goals addressed during session: ADL's and self-care       AM-PAC PT "6 Clicks" Mobility  Outcome Measure Help needed turning from your back to your side while in a flat bed without using bedrails?: A Little Help needed moving from lying on your back to sitting on the side of a flat bed without using bedrails?: A Little Help needed moving to and from a bed to a chair (including a wheelchair)?: A Little Help needed standing up from a chair using your arms (e.g., wheelchair or bedside chair)?: A Little Help needed to walk in hospital room?: A Lot Help needed climbing 3-5 steps with a railing? : A Lot 6  Click Score: 16    End of Session Equipment Utilized During Treatment: Gait belt Activity Tolerance: Patient limited by fatigue Patient left: in bed;with family/visitor present;with nursing/sitter in room;with call bell/phone within reach Nurse Communication: Mobility status PT Visit Diagnosis: Other abnormalities of gait and mobility (R26.89);Repeated falls (R29.6);Unsteadiness on feet (R26.81);Difficulty in walking, not elsewhere classified (R26.2);History of falling (Z91.81)    Time: 4259-5638 PT Time Calculation (min) (ACUTE ONLY): 33 min   Charges:   PT Evaluation $PT Eval Moderate Complexity: 1 Mod PT Treatments $Therapeutic Activity: 8-22 mins PT General Charges $$ ACUTE PT VISIT: 1 Visit         Cecile Sheerer, SPT 09/11/23, 4:01 PM

## 2023-09-11 NOTE — Progress Notes (Signed)
[facility] [place], [place] 09/11/23  Subjective:   Hospital day # 1 Patient with medical problems of sCHF with EF < 20%, CKD-4, HTN, HLD, DM, COPD on 2L O2, CAD with stent and hx of CABG, stroke, A fib on Eliquis, s/p of AICD, melanoma, psoriatic arthritis, pancreatitis, liver cirrhosis with ascites (shown by recent CT scan on 08/29/23)  Known to our practice from outpatient f/u of CKD  Multiple recent falls and facial fractures. Presented to ER for eval post fall. Increased generalized weakness and confusion.  Found to have High K, AKI Admitted for critical illness / sepsis and pneumonia.  Son and wife at bedside.  Renal: 10/16 0701 - 10/17 0700 In: 977.4 [P.O.:120; I.V.:857.4] Out: 150 [Urine:150] Lab Results  Component Value Date   CREATININE 4.95 (H) 09/11/2023   CREATININE 5.46 (H) 09/10/2023   CREATININE 2.82 (H) 08/29/2023     Objective:  Vital signs in last 24 hours:  Temp:  [92.1 F (33.4 C)-97.7 F (36.5 C)] 97.7 F (36.5 C) (10/17 0740) Pulse Rate:  [55-75] 61 (10/17 0700) Resp:  [14-23] 20 (10/17 0700) BP: (80-106)/(59-74) 103/69 (10/17 0700) SpO2:  [94 %-100 %] 94 % (10/17 0700) Weight:  [68 kg-71.8 kg] 71.8 kg (10/17 0500)  Weight change:  Filed Weights   09/10/23 1552 09/10/23 2300 09/11/23 0500  Weight: 68 kg 71.8 kg 71.8 kg    Intake/Output:    Intake/Output Summary (Last 24 hours) at 09/11/2023 2956 Last data filed at 09/11/2023 0700 Gross per 24 hour  Intake 977.44 ml  Output 150 ml  Net 827.44 ml     Physical Exam: General: Ill-appearing, laying in the bed  HEENT Multiple bruises from facial fractures  Pulm/lungs Coarse breath sounds, normal breathing effort  CVS/Heart Irregular rhythm  Abdomen:  Soft, nontender, nondistended  Extremities: Trace edema  Neurologic: Alert, able to follow commands  Skin: Multiple scattered ecchymosis          Basic Metabolic Panel:  Recent Labs  Lab 09/10/23 1606  09/10/23 1801 09/11/23 0006 09/11/23 0330  NA 135  --   --  132*  K 6.8* 6.1* 6.2* 6.0*  CL 95*  --   --  95*  CO2 25  --   --  24  GLUCOSE 173*  --   --  90  BUN 93*  --   --  99*  CREATININE 5.46*  --   --  4.95*  CALCIUM 8.9  --   --  8.7*  MG 2.8*  --   --  2.3  PHOS 6.5*  --   --  5.9*     CBC: Recent Labs  Lab 09/10/23 1606 09/11/23 0330  WBC 13.9* 9.7  NEUTROABS 12.0*  --   HGB 12.3* 11.0*  HCT 40.2 35.3*  MCV 101.0* 98.9  PLT 133* 129*     No results found for: "HEPBSAG", "HEPBSAB", "HEPBIGM"    Microbiology:  Recent Results (from the past 240 hour(s))  Blood culture (routine single)     Status: None (Preliminary result)   Collection Time: 09/10/23  4:06 PM   Specimen: BLOOD  Result Value Ref Range Status   Specimen Description BLOOD LEFT ANTECUBITAL  Final   Special Requests   Final    BOTTLES DRAWN AEROBIC AND ANAEROBIC Blood Culture adequate volume   Culture  Setup Time ANAEROBIC BOTTLE ONLY  Final   Culture   Final    NO ORGANISMS SEEN Performed at [facility], [address] [place], [place] [address]    Report Status PENDING  Incomplete  Culture, blood (single)     Status: None (Preliminary result)   Collection Time: 09/10/23  5:02 PM   Specimen: BLOOD  Result Value Ref Range Status   Specimen Description BLOOD BLOOD RIGHT FOREARM  Final   Special Requests   Final    BOTTLES DRAWN AEROBIC AND ANAEROBIC Blood Culture results may not be optimal due to an inadequate volume of blood received in culture bottles   Culture   Final    NO GROWTH < 24 HOURS Performed at [facility], [address], [place], [place] [address]    Report Status PENDING  Incomplete  SARS Coronavirus 2 by RT PCR (hospital order, performed in [facility] hospital lab) *cepheid single result test* Anterior Nasal Swab     Status: None   Collection Time: 09/10/23  9:38 PM   Specimen: Anterior Nasal Swab  Result Value Ref Range Status   SARS  Coronavirus 2 by RT PCR NEGATIVE NEGATIVE Final    Comment: (NOTE) SARS-CoV-2 target nucleic acids are NOT DETECTED.  The SARS-CoV-2 RNA is generally detectable in upper and lower respiratory specimens during the acute phase of infection. The lowest concentration of SARS-CoV-2 viral copies this assay can detect is 250 copies / mL. A negative result does not preclude SARS-CoV-2 infection and should not be used as the sole basis for treatment or other patient management decisions.  A negative result may occur with improper specimen collection / handling, submission of specimen other than nasopharyngeal swab, presence of viral mutation(s) within the areas targeted by this assay, and inadequate number of viral copies (<250 copies / mL). A negative result must be combined with clinical observations, patient history, and epidemiological information.  Fact Sheet for Patients:   RoadLapTop.co.za  Fact Sheet for Healthcare Providers: http://kim-miller.com/  This test is not yet approved or  cleared by the [place] FDA and has been authorized for detection and/or diagnosis of SARS-CoV-2 by FDA under an Emergency Use Authorization (EUA).  This EUA will remain in effect (meaning this test can be used) for the duration of the COVID-19 declaration under Section 564(b)(1) of the Act, 21 U.S.C. section 360bbb-3(b)(1), unless the authorization is terminated or revoked sooner.  Performed at [facility], [address] [place], [place] [address]   Respiratory (~20 pathogens) panel by PCR     Status: None   Collection Time: 09/10/23  9:38 PM   Specimen: Nasopharyngeal Swab; Respiratory  Result Value Ref Range Status   Adenovirus NOT DETECTED NOT DETECTED Final   Coronavirus 229E NOT DETECTED NOT DETECTED Final    Comment: (NOTE) The Coronavirus on the Respiratory Panel, DOES NOT test for the novel  Coronavirus (2019 nCoV)     Coronavirus HKU1 NOT DETECTED NOT DETECTED Final   Coronavirus NL63 NOT DETECTED NOT DETECTED Final   Coronavirus OC43 NOT DETECTED NOT DETECTED Final   Metapneumovirus NOT DETECTED NOT DETECTED Final   Rhinovirus / Enterovirus NOT DETECTED NOT DETECTED Final   Influenza A NOT DETECTED NOT DETECTED Final   Influenza B NOT DETECTED NOT DETECTED Final   Parainfluenza Virus 1 NOT DETECTED NOT DETECTED Final   Parainfluenza Virus 2 NOT DETECTED NOT DETECTED Final   Parainfluenza Virus 3 NOT DETECTED NOT DETECTED Final   Parainfluenza Virus 4 NOT DETECTED NOT DETECTED Final   Respiratory Syncytial Virus NOT DETECTED NOT DETECTED Final   Bordetella pertussis NOT DETECTED NOT DETECTED Final  Bordetella Parapertussis NOT DETECTED NOT DETECTED Final   Chlamydophila pneumoniae NOT DETECTED NOT DETECTED Final   Mycoplasma pneumoniae NOT DETECTED NOT DETECTED Final    Comment: Performed at [facility], [address] [place], [place] [address]  MRSA Next Gen by PCR, Nasal     Status: Abnormal   Collection Time: 09/10/23 11:04 PM   Specimen: Nasal Mucosa; Nasal Swab  Result Value Ref Range Status   MRSA by PCR Next Gen DETECTED (A) NOT DETECTED Final    Comment: RESULT CALLED TO, READ BACK BY AND VERIFIED WITH: MARIA CONZALES@[id number]  09/11/23 RH (NOTE) The GeneXpert MRSA Assay (FDA approved for NASAL specimens only), is one component of a comprehensive MRSA colonization surveillance program. It is not intended to diagnose MRSA infection nor to guide or monitor treatment for MRSA infections. Test performance is not FDA approved in patients less than 52 years old. Performed at [facility], [address] [place], [place] [address]     Coagulation Studies: Recent Labs    09/10/23 1606 09/11/23 0330  LABPROT 31.4* 31.6*  INR 3.0* 3.0*    Urinalysis: Recent Labs    09/11/23 0409  COLORURINE YELLOW*  LABSPEC 1.015  PHURINE 5.0  GLUCOSEU 50*  HGBUR MODERATE*   BILIRUBINUR NEGATIVE  KETONESUR NEGATIVE  PROTEINUR NEGATIVE  NITRITE NEGATIVE  LEUKOCYTESUR NEGATIVE      Imaging: CT CHEST ABDOMEN PELVIS WO CONTRAST  Result Date: 09/10/2023 CLINICAL DATA:  Sepsis, fall. EXAM: CT CHEST, ABDOMEN AND PELVIS WITHOUT CONTRAST TECHNIQUE: Multidetector CT imaging of the chest, abdomen and pelvis was performed following the standard protocol without IV contrast. RADIATION DOSE REDUCTION: This exam was performed according to the departmental dose-optimization program which includes automated exposure control, adjustment of the mA and/or kV according to patient size and/or use of iterative reconstruction technique. COMPARISON:  CT chest abdomen and pelvis 08/29/2023 FINDINGS: CT CHEST FINDINGS Cardiovascular: The heart is moderately enlarged, unchanged. There is no pericardial effusion. Left-sided pacemaker is present. Aorta is normal in size. There are atherosclerotic calcifications of the aorta. Mediastinum/Nodes: No enlarged mediastinal, hilar, or axillary lymph nodes. Thyroid gland, trachea, and esophagus demonstrate no significant findings. There are calcified left hilar lymph nodes. Lungs/Pleura: There are calcified pleural plaques bilaterally. Small to moderate-sized right pleural effusion is present. There is new patchy airspace disease in the right middle lobe and left lower lobe with additional ground-glass opacities in the left lower lobe compatible with infection. There is a calcified granuloma in the left lower lobe. Musculoskeletal: Sternotomy wires are present. There are healed left-sided rib fractures. CT ABDOMEN PELVIS FINDINGS Hepatobiliary: Nodular liver contour is again seen. Gallbladder and bile ducts are within normal limits. Pancreas: Unremarkable. No pancreatic ductal dilatation or surrounding inflammatory changes. Spleen: Normal in size without focal abnormality. Calcified granulomas are present. Adrenals/Urinary Tract: Subcentimeter hyperdense  lesion in the superior pole the right kidney is unchanged. Right kidney is otherwise within normal limits. There is left renal atrophy, unchanged. The adrenal glands and bladder are within normal limits. Stomach/Bowel: Stomach is within normal limits. No evidence of bowel wall thickening, distention, or inflammatory changes. The appendix is not seen. There is colonic diverticulosis. Duodenal diverticulum is present. Vascular/Lymphatic: There is aneurysmal dilatation of the aorta at the level of the renal arteries measuring 3.3 cm, unchanged. Patient is status post aorto bi-iliac graft. There severe atherosclerotic calcifications throughout the aorta. IVC is normal in size. No enlarged lymph nodes are identified. Reproductive: Prostate is unremarkable. Other: There is a  small right inguinal hernia containing free fluid. There is small amount of ascites throughout the abdomen and pelvis. Musculoskeletal: Degenerative changes affect the spine. IMPRESSION: 1. New patchy airspace disease in the right middle lobe and left lower lobe compatible with infection. 2. Small to moderate-sized right pleural effusion. 3. Stable moderate cardiomegaly. 4. Stable nodular liver contour compatible with cirrhosis. 5. Small amount of ascites. 6. Stable 3.3 cm abdominal aortic aneurysm. Recommend follow-up every 3 years. 7. Aortic atherosclerosis. Aortic Atherosclerosis (ICD10-I70.0). Electronically Signed   By: Darliss Cheney M.D.   On: 09/10/2023 21:04   CT Head Wo Contrast  Result Date: 09/10/2023 CLINICAL DATA:  Head trauma, minor (Age >= 65y); Facial trauma, blunt; Neck trauma (Age >= 65y) EXAM: CT HEAD WITHOUT CONTRAST CT MAXILLOFACIAL WITHOUT CONTRAST CT CERVICAL SPINE WITHOUT CONTRAST TECHNIQUE: Multidetector CT imaging of the head, cervical spine, and maxillofacial structures were performed using the standard protocol without intravenous contrast. Multiplanar CT image reconstructions of the cervical spine and maxillofacial  structures were also generated. RADIATION DOSE REDUCTION: This exam was performed according to the departmental dose-optimization program which includes automated exposure control, adjustment of the mA and/or kV according to patient size and/or use of iterative reconstruction technique. COMPARISON:  CT head, CT cervical spine and CT of the face from October 4, 24. FINDINGS: CT HEAD FINDINGS Brain: Remote left PCA territory infarct. No evidence of acute large vascular territory infarct, acute hemorrhage, mass lesion, midline shift or hydrocephalus. Vascular: No hyperdense vessel. Skull: Similar chronic deformity of the left calvarium. No acute fracture. Other: No mastoid effusions CT MAXILLOFACIAL FINDINGS Osseous: Similar nasal bone fractures. No evidence of interval fracture. TMJs are located. Orbits: Negative. No traumatic or inflammatory finding. Sinuses: Extensive paranasal sinus disease, slightly improved compared to the recent prior. Soft tissues: Decreased size of a left periorbital/forehead contusion compared to recent prior. CT CERVICAL SPINE FINDINGS Alignment: No substantial sagittal subluxation. Skull base and vertebrae: No evidence of acute fracture. Soft tissues and spinal canal: No prevertebral fluid or swelling. No visible canal hematoma. Disc levels: Similar moderate to severe multilevel degenerative change including facet and uncovertebral hypertrophy with varying degrees of neural foraminal stenosis. Upper chest: Please see same day CT of the chest/abdomen/pelvis for further evaluation. Other: Vascular stent in the left neck. IMPRESSION: 1. No acute intracranial abnormality. 2. Similar recent nasal bone fractures. No evidence of interval facial fracture. 3. No evidence of acute fracture or traumatic malalignment in the cervical spine. Electronically Signed   By: Feliberto Harts M.D.   On: 09/10/2023 19:11   CT Maxillofacial Wo Contrast  Result Date: 09/10/2023 CLINICAL DATA:  Head trauma,  minor (Age >= 65y); Facial trauma, blunt; Neck trauma (Age >= 65y) EXAM: CT HEAD WITHOUT CONTRAST CT MAXILLOFACIAL WITHOUT CONTRAST CT CERVICAL SPINE WITHOUT CONTRAST TECHNIQUE: Multidetector CT imaging of the head, cervical spine, and maxillofacial structures were performed using the standard protocol without intravenous contrast. Multiplanar CT image reconstructions of the cervical spine and maxillofacial structures were also generated. RADIATION DOSE REDUCTION: This exam was performed according to the departmental dose-optimization program which includes automated exposure control, adjustment of the mA and/or kV according to patient size and/or use of iterative reconstruction technique. COMPARISON:  CT head, CT cervical spine and CT of the face from October 4, 24. FINDINGS: CT HEAD FINDINGS Brain: Remote left PCA territory infarct. No evidence of acute large vascular territory infarct, acute hemorrhage, mass lesion, midline shift or hydrocephalus. Vascular: No hyperdense vessel. Skull: Similar chronic deformity of the left  calvarium. No acute fracture. Other: No mastoid effusions CT MAXILLOFACIAL FINDINGS Osseous: Similar nasal bone fractures. No evidence of interval fracture. TMJs are located. Orbits: Negative. No traumatic or inflammatory finding. Sinuses: Extensive paranasal sinus disease, slightly improved compared to the recent prior. Soft tissues: Decreased size of a left periorbital/forehead contusion compared to recent prior. CT CERVICAL SPINE FINDINGS Alignment: No substantial sagittal subluxation. Skull base and vertebrae: No evidence of acute fracture. Soft tissues and spinal canal: No prevertebral fluid or swelling. No visible canal hematoma. Disc levels: Similar moderate to severe multilevel degenerative change including facet and uncovertebral hypertrophy with varying degrees of neural foraminal stenosis. Upper chest: Please see same day CT of the chest/abdomen/pelvis for further evaluation. Other:  Vascular stent in the left neck. IMPRESSION: 1. No acute intracranial abnormality. 2. Similar recent nasal bone fractures. No evidence of interval facial fracture. 3. No evidence of acute fracture or traumatic malalignment in the cervical spine. Electronically Signed   By: Feliberto Harts M.D.   On: 09/10/2023 19:11   CT Cervical Spine Wo Contrast  Result Date: 09/10/2023 CLINICAL DATA:  Head trauma, minor (Age >= 65y); Facial trauma, blunt; Neck trauma (Age >= 65y) EXAM: CT HEAD WITHOUT CONTRAST CT MAXILLOFACIAL WITHOUT CONTRAST CT CERVICAL SPINE WITHOUT CONTRAST TECHNIQUE: Multidetector CT imaging of the head, cervical spine, and maxillofacial structures were performed using the standard protocol without intravenous contrast. Multiplanar CT image reconstructions of the cervical spine and maxillofacial structures were also generated. RADIATION DOSE REDUCTION: This exam was performed according to the departmental dose-optimization program which includes automated exposure control, adjustment of the mA and/or kV according to patient size and/or use of iterative reconstruction technique. COMPARISON:  CT head, CT cervical spine and CT of the face from October 4, 24. FINDINGS: CT HEAD FINDINGS Brain: Remote left PCA territory infarct. No evidence of acute large vascular territory infarct, acute hemorrhage, mass lesion, midline shift or hydrocephalus. Vascular: No hyperdense vessel. Skull: Similar chronic deformity of the left calvarium. No acute fracture. Other: No mastoid effusions CT MAXILLOFACIAL FINDINGS Osseous: Similar nasal bone fractures. No evidence of interval fracture. TMJs are located. Orbits: Negative. No traumatic or inflammatory finding. Sinuses: Extensive paranasal sinus disease, slightly improved compared to the recent prior. Soft tissues: Decreased size of a left periorbital/forehead contusion compared to recent prior. CT CERVICAL SPINE FINDINGS Alignment: No substantial sagittal subluxation.  Skull base and vertebrae: No evidence of acute fracture. Soft tissues and spinal canal: No prevertebral fluid or swelling. No visible canal hematoma. Disc levels: Similar moderate to severe multilevel degenerative change including facet and uncovertebral hypertrophy with varying degrees of neural foraminal stenosis. Upper chest: Please see same day CT of the chest/abdomen/pelvis for further evaluation. Other: Vascular stent in the left neck. IMPRESSION: 1. No acute intracranial abnormality. 2. Similar recent nasal bone fractures. No evidence of interval facial fracture. 3. No evidence of acute fracture or traumatic malalignment in the cervical spine. Electronically Signed   By: Feliberto Harts M.D.   On: 09/10/2023 19:11   DG Elbow 2 Views Right  Result Date: 09/10/2023 CLINICAL DATA:  Pain after fall. EXAM: RIGHT ELBOW - 2 VIEW COMPARISON:  None Available. FINDINGS: No acute fracture or dislocation. No definite joint effusion. Corticated density posterior to the olecranon is chronic. Mild degenerative spurring. Mild posterior soft tissue edema. IMPRESSION: 1. No acute fracture or dislocation of the right elbow. 2. Mild posterior soft tissue edema. Electronically Signed   By: Narda Rutherford M.D.   On: 09/10/2023 18:29  DG Chest Port 1 View  Result Date: 09/10/2023 CLINICAL DATA:  Fall today.  Weakness. EXAM: PORTABLE CHEST 1 VIEW COMPARISON:  Radiograph and CT 08/29/2023 FINDINGS: Unchanged cardiomegaly post CABG. Left-sided pacemaker in place. Improvement in right pleural effusion from prior exam. Interstitial thickening is similar. No pneumothorax or acute airspace disease. Remote right clavicle fracture again seen. IMPRESSION: 1. No acute findings. 2. Unchanged cardiomegaly post CABG. Improvement in right pleural effusion from prior exam. Electronically Signed   By: Narda Rutherford M.D.   On: 09/10/2023 18:28   DG Elbow 2 Views Left  Result Date: 09/10/2023 CLINICAL DATA:  Fall.  Pain. EXAM:  LEFT ELBOW - 2 VIEW COMPARISON:  None Available. FINDINGS: No fracture or dislocation. No definite joint effusion. Skin folds project over the medial humeral epicondyle. Mild degenerative spurring. IV in the antecubital fossa. IMPRESSION: No fracture or dislocation of the left elbow. Mild degenerative spurring. Electronically Signed   By: Narda Rutherford M.D.   On: 09/10/2023 18:27     Medications:    sodium chloride     calcium gluconate     [START ON 09/12/2023] ceFEPime (MAXIPIME) IV     phenylephrine (NEO-SYNEPHRINE) Adult infusion 25 mcg/min (09/11/23 0700)   sodium bicarbonate 150 mEq in sterile water 1,150 mL infusion 75 mL/hr at 09/11/23 0700    aspirin EC  81 mg Oral Daily   Chlorhexidine Gluconate Cloth  6 each Topical QHS   insulin aspart  0-5 Units Subcutaneous QHS   insulin aspart  0-9 Units Subcutaneous TID WC   lactulose  10 g Oral BID   midodrine  5 mg Oral TID WC   mupirocin ointment  1 Application Nasal BID   pantoprazole  40 mg Oral Daily   sodium chloride flush  10 mL Intravenous Q12H   sodium zirconium cyclosilicate  10 g Oral Q8H   triamcinolone ointment   Topical BID   vancomycin variable dose per unstable renal function (pharmacist dosing)   Does not apply See admin instructions   acetaminophen, dextromethorphan-guaiFENesin, ipratropium-albuterol, ondansetron (ZOFRAN) IV  Assessment/ Plan:  82 y.o. male with sCHF with EF < 20%, CKD-4, HTN, HLD, DM, COPD on 2L O2, CAD with stent and hx of CABG, stroke, A fib on Eliquis, s/p of AICD, melanoma, psoriatic arthritis, pancreatitis, liver cirrhosis with ascites (noted on recent CT scan on 08/29/23) admitted on 09/10/2023 for Hyperkalemia [E87.5] SIRS (systemic inflammatory response syndrome) (HCC) [R65.10] AKI (acute kidney injury) (HCC) [N17.9] Injury of head, initial encounter [S09.90XA] Fall, initial encounter [W19.XXXA] Acute kidney injury superimposed on stage 4 chronic kidney disease (HCC) [N17.9,  N18.4]  Acute kidney injury on chronic kidney disease stage IV Patient followed by our practice for chronic kidney disease. Most recent outpatient labs-creatinine 2.65/GFR 24 from 07/01/2023. AKI likely secondary to volume depletion and concurrent illness. Serum creatinine has started to improve slowly. Continue to monitor urine output and electrolytes closely.  Hyperkalemia Potassium level of 6.8 at presentation. Most recent potassium has improved to 5.5 with medical management. Agree with holding potassium and spironolactone. Continue conservative measures.  No acute indication for dialysis at present.  Pneumonia/sepsis/lactic acidosis Hypotensive, requiring pressors, midodrine. Antibiotics-vancomycin, levofloxacin as per ICU team. Continue supportive care.     LOS: 1 Lariyah Shetterly 10/17/20248:32 AM  [facility] [place], [place] [phone]  Note: This note was prepared with Dragon dictation. Any transcription errors are unintentional

## 2023-09-11 NOTE — Evaluation (Signed)
Occupational Therapy Evaluation Patient Details Name: Taylor [name] MRN: [id number] DOB: 08-09-1941 Today's Date: 09/11/2023   History of Present Illness ALOYSUIS SCHOENBACHLER is a 82 y.o. male with medical history significant of sCHF with EF < 20%, CKD-4, HTN, HLD, DM, COPD on 2L O2, CAD with stent and hx of CABG, stroke, A fib on Eliquis, s/p of AICD, melanoma, psoriatic arthritis, pancreatitis, liver cirrhosis with ascites (shown by recent CT scan on 08/29/23), who presents with fall.  Patient was seen in ED on 08/29/2023 due to fall.  Patient was found to have minimally displaced and comminuted radial styloid fracture. Pt was started on splint to left forearm.  CT of the maxillofacial images on 08/29/23 showed minimally displaced and comminuted radial styloid fracture.   Clinical Impression   Mr Fruit was seen for OT/PT evaluation this date. Prior to hospital admission, pt was MOD I using AD as needed; history of falls with recent L wrist fx. Pt lives with spouse in home with 3 STE. Pt currently requires SETUP + SUPERVISION self-feeding in sitting. MOD A + HHA sit<>stand and side steps along EOB. Pt reports dizziness in standing and pain at catheter site. Pt would benefit from skilled OT to address noted impairments and functional limitations (see below for any additional details). Upon hospital discharge, recommend OT follow up.     If plan is discharge home, recommend the following: Two people to help with walking and/or transfers;A lot of help with bathing/dressing/bathroom;Supervision due to cognitive status;Help with stairs or ramp for entrance    Functional Status Assessment  Patient has had a recent decline in their functional status and demonstrates the ability to make significant improvements in function in a reasonable and predictable amount of time.  Equipment Recommendations  Other (comment) (defer to next venue of care)    Recommendations for Other Services       Precautions /  Restrictions Precautions Precautions: Fall Restrictions Weight Bearing Restrictions: No LUE Weight Bearing: Non weight bearing      Mobility Bed Mobility Overal bed mobility: Needs Assistance Bed Mobility: Sit to Supine       Sit to supine: +2 for physical assistance, Mod assist   General bed mobility comments: seated on arrival    Transfers Overall transfer level: Needs assistance Equipment used: 1 person hand held assist Transfers: Sit to/from Stand Sit to Stand: Mod assist, +2 safety/equipment           General transfer comment: x2 trials with poor adherence to LUE NWBing      Balance Overall balance assessment: Needs assistance Sitting-balance support: Feet unsupported, Single extremity supported Sitting balance-Leahy Scale: Good     Standing balance support: Single extremity supported Standing balance-Leahy Scale: Poor                             ADL either performed or assessed with clinical judgement   ADL Overall ADL's : Needs assistance/impaired                                       General ADL Comments: MOD A + HHA for simulated BSC t/f. SETUP + SUPERVISION self-feeding in sitting      Pertinent Vitals/Pain Pain Assessment Pain Assessment: Faces Faces Pain Scale: Hurts little more Pain Location: catheter insertion site Pain Descriptors / Indicators: Discomfort, Grimacing Pain Intervention(s): Limited activity  within patient's tolerance, Repositioned     Extremity/Trunk Assessment Upper Extremity Assessment Upper Extremity Assessment: LUE deficits/detail LUE Deficits / Details: NWB on L wrist; pt in splint   Lower Extremity Assessment Lower Extremity Assessment: Generalized weakness       Communication Communication Communication: Hearing impairment   Cognition Arousal: Alert Behavior During Therapy: WFL for tasks assessed/performed Overall Cognitive Status: Impaired/Different from baseline                                  General Comments: Difficulty following multi step commands                Home Living Family/patient expects to be discharged to:: Private residence Living Arrangements: Spouse/significant other Available Help at Discharge: Family;Available 24 hours/day Type of Home: House Home Access: Stairs to enter [employer] of Steps: 3 Entrance Stairs-Rails: Can reach both Home Layout: One level     Bathroom Shower/Tub: Tub/shower unit;Walk-in shower   Bathroom Toilet: Handicapped height     Home Equipment: Agricultural consultant (2 wheels);Adaptive equipment Adaptive Equipment: Sock aid        Prior Functioning/Environment Prior Level of Function : History of Falls (last six months)             Mobility Comments: reports no AD prior to recent falls, currently RW for limited community distances ADLs Comments: Ind with ADLs        OT Problem List: Decreased strength;Decreased range of motion;Impaired balance (sitting and/or standing);Decreased activity tolerance;Decreased safety awareness      OT Treatment/Interventions: Self-care/ADL training;Therapeutic exercise;Energy conservation;DME and/or AE instruction;Therapeutic activities;Patient/family education;Balance training    OT Goals(Current goals can be found in the care plan section) Acute Rehab OT Goals Patient Stated Goal: to go home OT Goal Formulation: With patient/family Time For Goal Achievement: 09/25/23 Potential to Achieve Goals: Good ADL Goals Pt Will Perform Grooming: with modified independence;standing Pt Will Perform Lower Body Dressing: with modified independence;sit to/from stand Pt Will Transfer to Toilet: with modified independence;ambulating;regular height toilet  OT Frequency: Min 1X/week    Co-evaluation PT/OT/SLP Co-Evaluation/Treatment: Yes Reason for Co-Treatment: For patient/therapist safety;To address functional/ADL transfers PT goals addressed during  session: Mobility/safety with mobility OT goals addressed during session: ADL's and self-care      AM-PAC OT "6 Clicks" Daily Activity     Outcome Measure Help from another person eating meals?: None Help from another person taking care of personal grooming?: A Little Help from another person toileting, which includes using toliet, bedpan, or urinal?: A Lot Help from another person bathing (including washing, rinsing, drying)?: A Lot Help from another person to put on and taking off regular upper body clothing?: A Little Help from another person to put on and taking off regular lower body clothing?: A Lot 6 Click Score: 16   End of Session Equipment Utilized During Treatment: Gait belt  Activity Tolerance: Patient tolerated treatment well Patient left: in bed;with call bell/phone within reach;with family/visitor present  OT Visit Diagnosis: Other abnormalities of gait and mobility (R26.89);Muscle weakness (generalized) (M62.81)                Time: 1341-1415 OT Time Calculation (min): 34 min Charges:  OT General Charges $OT Visit: 1 Visit OT Evaluation $OT Eval Moderate Complexity: 1 Mod OT Treatments $Self Care/Home Management : 8-22 mins  Kathie Dike, M.S. OTR/L  09/11/23, 3:55 PM  ascom [phone]

## 2023-09-12 ENCOUNTER — Ambulatory Visit: Payer: Medicare Other | Admitting: Family Medicine

## 2023-09-12 DIAGNOSIS — N179 Acute kidney failure, unspecified: Secondary | ICD-10-CM | POA: Diagnosis not present

## 2023-09-12 DIAGNOSIS — J189 Pneumonia, unspecified organism: Secondary | ICD-10-CM | POA: Diagnosis not present

## 2023-09-12 DIAGNOSIS — I5042 Chronic combined systolic (congestive) and diastolic (congestive) heart failure: Secondary | ICD-10-CM | POA: Diagnosis not present

## 2023-09-12 DIAGNOSIS — E875 Hyperkalemia: Secondary | ICD-10-CM | POA: Diagnosis not present

## 2023-09-12 LAB — BASIC METABOLIC PANEL
Anion gap: 15 (ref 5–15)
BUN: 104 mg/dL — ABNORMAL HIGH (ref 8–23)
CO2: 26 mmol/L (ref 22–32)
Calcium: 8.8 mg/dL — ABNORMAL LOW (ref 8.9–10.3)
Chloride: 90 mmol/L — ABNORMAL LOW (ref 98–111)
Creatinine, Ser: 5.17 mg/dL — ABNORMAL HIGH (ref 0.61–1.24)
GFR, Estimated: 11 mL/min — ABNORMAL LOW (ref >60)
Glucose, Bld: 142 mg/dL — ABNORMAL HIGH (ref 70–99)
Potassium: 4.4 mmol/L (ref 3.5–5.1)
Sodium: 131 mmol/L — ABNORMAL LOW (ref 135–145)

## 2023-09-12 LAB — CBC
HCT: 35 % — ABNORMAL LOW (ref 39.0–52.0)
Hemoglobin: 11.1 g/dL — ABNORMAL LOW (ref 13.0–17.0)
MCH: 30.7 pg (ref 26.0–34.0)
MCHC: 31.7 g/dL (ref 30.0–36.0)
MCV: 97 fL (ref 80.0–100.0)
Platelets: 152 10*3/uL (ref 150–400)
RBC: 3.61 MIL/uL — ABNORMAL LOW (ref 4.22–5.81)
RDW: 17.6 % — ABNORMAL HIGH (ref 11.5–15.5)
WBC: 9.9 10*3/uL (ref 4.0–10.5)
nRBC: 0.2 % (ref 0.0–0.2)

## 2023-09-12 LAB — PROCALCITONIN: Procalcitonin: 1.4 ng/mL

## 2023-09-12 LAB — GLUCOSE, CAPILLARY
Glucose-Capillary: 139 mg/dL — ABNORMAL HIGH (ref 70–99)
Glucose-Capillary: 148 mg/dL — ABNORMAL HIGH (ref 70–99)
Glucose-Capillary: 138 mg/dL — ABNORMAL HIGH (ref 70–99)
Glucose-Capillary: 122 mg/dL — ABNORMAL HIGH (ref 70–99)

## 2023-09-12 LAB — LEGIONELLA PNEUMOPHILA SEROGP 1 UR AG: L. pneumophila Serogp 1 Ur Ag: NEGATIVE

## 2023-09-12 LAB — MAGNESIUM: Magnesium: 2.3 mg/dL (ref 1.7–2.4)

## 2023-09-12 LAB — PROTIME-INR
INR: 2.3 — ABNORMAL HIGH (ref 0.8–1.2)
Prothrombin Time: 25.2 s — ABNORMAL HIGH (ref 11.4–15.2)

## 2023-09-12 LAB — PHOSPHORUS: Phosphorus: 7 mg/dL — ABNORMAL HIGH (ref 2.5–4.6)

## 2023-09-12 NOTE — Progress Notes (Signed)
[facility] CLINIC CARDIOLOGY Progress NOTE       Patient ID: Taylor [name] MRN: [id number] DOB/AGE: 1941/04/03 82 y.o.  Admit date: 09/10/2023 Referring Physician Dr.  Valera Castle Physician Dr. Zada Girt Primary Cardiologist Dr. Marcina Millard Reason for Consultation Chronic Heart Failure, EF < 20%  HPI: Taylor [name] is a 82 y.o. male  with a past medical history of paroxysmal atrial fibrillation (Eliquis), chronic HFrEF (EF < 20%), s/p CABG (08/2006), s/p dual-chamber ICD (2014), SSS, HTN, HLD, type 2 diabetes, pancreatitis, ischemic cardiomyopathy, ventricular fibrillation arrest (1994) s/p MI infarction, bilateral carotid artery stenosis s/p left carotid stent (06/2022), abdominal aortic aneurysm s/p endovascular repair (2008), CKD stage 4, COPD (baseline 2L [place]) liver cirrhosis with ascites (CT 08/2023), who presented to the ED on 09/10/2023 for frequent falls and generalized weakness. Cardiology was consulted for further evaluation.   Interval History: - Patient is laying at an incline in hospital bed with wife at bedside. Patient states he is doing "fair" and denies chest pain or palpitations. Patient states he is coughing which is not new for him. - There has been no change from cardiac perspective. No plan for further cardiac diagnostics.  Review of systems complete and found to be negative unless listed above    Past Medical History:  Diagnosis Date   AAA (abdominal aortic aneurysm) (HCC) 06/03/2007   [facility]; Dr. Hart Rochester   AICD (automatic cardioverter/defibrillator) present    Arrhythmia    atrial fibrillation   Barrett's esophagus    Bladder cancer (HCC)    Bradycardia    CAD (coronary artery disease)    CAP (community acquired pneumonia) 11/13/2019   CHF (congestive heart failure) (HCC)    Cluster headache    COVID-19 09/27/2021   DDD (degenerative disc disease), lumbar    Diabetes mellitus without complication (HCC)    Dyspnea    WITH  EXERTION   Edema    LEFT ANKLE   Fracture of skull base (HCC) 1997   due to fall   GERD (gastroesophageal reflux disease)    Gout    History of bladder cancer 12/1995   Hyperlipidemia    Hypertension    Hypocalcemia 04/19/2020   Possibly secondary to diuretics.   Low=5.9 04/19/2020   Malignant melanoma (HCC) 12/2012   right dorsal forearm excised   Myocardial infarction [facility])    LAST 2014   Osteoarthritis of knee    Other specified complication of vascular prosthetic devices, implants and grafts, initial encounter (HCC) 08/22/2021   Pacemaker 10/10/2006   Pancreatitis 11/22/2019   Pneumonia    2016   Pre-diabetes    Psoriasis    Rib fracture 1997   due to fall   Sleep apnea    CPAP   Stroke [facility])    Venous incompetence     Past Surgical History:  Procedure Laterality Date   ABDOMINAL AORTIC ANEURYSM REPAIR  06/03/2007   [facility]; Dr. Hart Rochester   ANGIOPLASTY  1994   MI   BLADDER TUMOR EXCISION  12/1995   CAROTID PTA/STENT INTERVENTION Left 07/23/2022   Procedure: CAROTID PTA/STENT INTERVENTION;  Surgeon: Renford Dills, MD;  Location: [facility] INVASIVE CV LAB;  Service: Cardiovascular;  Laterality: Left;   CATARACT EXTRACTION W/PHACO Left 10/22/2016   Procedure: CATARACT EXTRACTION PHACO AND INTRAOCULAR LENS PLACEMENT (IOC);  Surgeon: Galen Manila, MD;  Location: [facility] ORS;  Service: Ophthalmology;  Laterality: Left;  [place] 47.7 AP% 18.4 CDE 8.78 Fluid pack lot # [id number]  [facility] CLINIC CARDIOLOGY Progress NOTE       Patient ID: Taylor [name] MRN: [id number] DOB/AGE: 1941/04/03 82 y.o.  Admit date: 09/10/2023 Referring Physician Dr.  Valera Castle Physician Dr. Zada Girt Primary Cardiologist Dr. Marcina Millard Reason for Consultation Chronic Heart Failure, EF < 20%  HPI: Taylor [name] is a 82 y.o. male  with a past medical history of paroxysmal atrial fibrillation (Eliquis), chronic HFrEF (EF < 20%), s/p CABG (08/2006), s/p dual-chamber ICD (2014), SSS, HTN, HLD, type 2 diabetes, pancreatitis, ischemic cardiomyopathy, ventricular fibrillation arrest (1994) s/p MI infarction, bilateral carotid artery stenosis s/p left carotid stent (06/2022), abdominal aortic aneurysm s/p endovascular repair (2008), CKD stage 4, COPD (baseline 2L [place]) liver cirrhosis with ascites (CT 08/2023), who presented to the ED on 09/10/2023 for frequent falls and generalized weakness. Cardiology was consulted for further evaluation.   Interval History: - Patient is laying at an incline in hospital bed with wife at bedside. Patient states he is doing "fair" and denies chest pain or palpitations. Patient states he is coughing which is not new for him. - There has been no change from cardiac perspective. No plan for further cardiac diagnostics.  Review of systems complete and found to be negative unless listed above    Past Medical History:  Diagnosis Date   AAA (abdominal aortic aneurysm) (HCC) 06/03/2007   [facility]; Dr. Hart Rochester   AICD (automatic cardioverter/defibrillator) present    Arrhythmia    atrial fibrillation   Barrett's esophagus    Bladder cancer (HCC)    Bradycardia    CAD (coronary artery disease)    CAP (community acquired pneumonia) 11/13/2019   CHF (congestive heart failure) (HCC)    Cluster headache    COVID-19 09/27/2021   DDD (degenerative disc disease), lumbar    Diabetes mellitus without complication (HCC)    Dyspnea    WITH  EXERTION   Edema    LEFT ANKLE   Fracture of skull base (HCC) 1997   due to fall   GERD (gastroesophageal reflux disease)    Gout    History of bladder cancer 12/1995   Hyperlipidemia    Hypertension    Hypocalcemia 04/19/2020   Possibly secondary to diuretics.   Low=5.9 04/19/2020   Malignant melanoma (HCC) 12/2012   right dorsal forearm excised   Myocardial infarction [facility])    LAST 2014   Osteoarthritis of knee    Other specified complication of vascular prosthetic devices, implants and grafts, initial encounter (HCC) 08/22/2021   Pacemaker 10/10/2006   Pancreatitis 11/22/2019   Pneumonia    2016   Pre-diabetes    Psoriasis    Rib fracture 1997   due to fall   Sleep apnea    CPAP   Stroke [facility])    Venous incompetence     Past Surgical History:  Procedure Laterality Date   ABDOMINAL AORTIC ANEURYSM REPAIR  06/03/2007   [facility]; Dr. Hart Rochester   ANGIOPLASTY  1994   MI   BLADDER TUMOR EXCISION  12/1995   CAROTID PTA/STENT INTERVENTION Left 07/23/2022   Procedure: CAROTID PTA/STENT INTERVENTION;  Surgeon: Renford Dills, MD;  Location: [facility] INVASIVE CV LAB;  Service: Cardiovascular;  Laterality: Left;   CATARACT EXTRACTION W/PHACO Left 10/22/2016   Procedure: CATARACT EXTRACTION PHACO AND INTRAOCULAR LENS PLACEMENT (IOC);  Surgeon: Galen Manila, MD;  Location: [facility] ORS;  Service: Ophthalmology;  Laterality: Left;  [place] 47.7 AP% 18.4 CDE 8.78 Fluid pack lot # [id number]  [facility] CLINIC CARDIOLOGY Progress NOTE       Patient ID: Taylor [name] MRN: [id number] DOB/AGE: 1941/04/03 82 y.o.  Admit date: 09/10/2023 Referring Physician Dr.  Valera Castle Physician Dr. Zada Girt Primary Cardiologist Dr. Marcina Millard Reason for Consultation Chronic Heart Failure, EF < 20%  HPI: Taylor [name] is a 82 y.o. male  with a past medical history of paroxysmal atrial fibrillation (Eliquis), chronic HFrEF (EF < 20%), s/p CABG (08/2006), s/p dual-chamber ICD (2014), SSS, HTN, HLD, type 2 diabetes, pancreatitis, ischemic cardiomyopathy, ventricular fibrillation arrest (1994) s/p MI infarction, bilateral carotid artery stenosis s/p left carotid stent (06/2022), abdominal aortic aneurysm s/p endovascular repair (2008), CKD stage 4, COPD (baseline 2L [place]) liver cirrhosis with ascites (CT 08/2023), who presented to the ED on 09/10/2023 for frequent falls and generalized weakness. Cardiology was consulted for further evaluation.   Interval History: - Patient is laying at an incline in hospital bed with wife at bedside. Patient states he is doing "fair" and denies chest pain or palpitations. Patient states he is coughing which is not new for him. - There has been no change from cardiac perspective. No plan for further cardiac diagnostics.  Review of systems complete and found to be negative unless listed above    Past Medical History:  Diagnosis Date   AAA (abdominal aortic aneurysm) (HCC) 06/03/2007   [facility]; Dr. Hart Rochester   AICD (automatic cardioverter/defibrillator) present    Arrhythmia    atrial fibrillation   Barrett's esophagus    Bladder cancer (HCC)    Bradycardia    CAD (coronary artery disease)    CAP (community acquired pneumonia) 11/13/2019   CHF (congestive heart failure) (HCC)    Cluster headache    COVID-19 09/27/2021   DDD (degenerative disc disease), lumbar    Diabetes mellitus without complication (HCC)    Dyspnea    WITH  EXERTION   Edema    LEFT ANKLE   Fracture of skull base (HCC) 1997   due to fall   GERD (gastroesophageal reflux disease)    Gout    History of bladder cancer 12/1995   Hyperlipidemia    Hypertension    Hypocalcemia 04/19/2020   Possibly secondary to diuretics.   Low=5.9 04/19/2020   Malignant melanoma (HCC) 12/2012   right dorsal forearm excised   Myocardial infarction [facility])    LAST 2014   Osteoarthritis of knee    Other specified complication of vascular prosthetic devices, implants and grafts, initial encounter (HCC) 08/22/2021   Pacemaker 10/10/2006   Pancreatitis 11/22/2019   Pneumonia    2016   Pre-diabetes    Psoriasis    Rib fracture 1997   due to fall   Sleep apnea    CPAP   Stroke [facility])    Venous incompetence     Past Surgical History:  Procedure Laterality Date   ABDOMINAL AORTIC ANEURYSM REPAIR  06/03/2007   [facility]; Dr. Hart Rochester   ANGIOPLASTY  1994   MI   BLADDER TUMOR EXCISION  12/1995   CAROTID PTA/STENT INTERVENTION Left 07/23/2022   Procedure: CAROTID PTA/STENT INTERVENTION;  Surgeon: Renford Dills, MD;  Location: [facility] INVASIVE CV LAB;  Service: Cardiovascular;  Laterality: Left;   CATARACT EXTRACTION W/PHACO Left 10/22/2016   Procedure: CATARACT EXTRACTION PHACO AND INTRAOCULAR LENS PLACEMENT (IOC);  Surgeon: Galen Manila, MD;  Location: [facility] ORS;  Service: Ophthalmology;  Laterality: Left;  [place] 47.7 AP% 18.4 CDE 8.78 Fluid pack lot # [id number]  [facility] CLINIC CARDIOLOGY Progress NOTE       Patient ID: Taylor [name] MRN: [id number] DOB/AGE: 1941/04/03 82 y.o.  Admit date: 09/10/2023 Referring Physician Dr.  Valera Castle Physician Dr. Zada Girt Primary Cardiologist Dr. Marcina Millard Reason for Consultation Chronic Heart Failure, EF < 20%  HPI: Taylor [name] is a 82 y.o. male  with a past medical history of paroxysmal atrial fibrillation (Eliquis), chronic HFrEF (EF < 20%), s/p CABG (08/2006), s/p dual-chamber ICD (2014), SSS, HTN, HLD, type 2 diabetes, pancreatitis, ischemic cardiomyopathy, ventricular fibrillation arrest (1994) s/p MI infarction, bilateral carotid artery stenosis s/p left carotid stent (06/2022), abdominal aortic aneurysm s/p endovascular repair (2008), CKD stage 4, COPD (baseline 2L [place]) liver cirrhosis with ascites (CT 08/2023), who presented to the ED on 09/10/2023 for frequent falls and generalized weakness. Cardiology was consulted for further evaluation.   Interval History: - Patient is laying at an incline in hospital bed with wife at bedside. Patient states he is doing "fair" and denies chest pain or palpitations. Patient states he is coughing which is not new for him. - There has been no change from cardiac perspective. No plan for further cardiac diagnostics.  Review of systems complete and found to be negative unless listed above    Past Medical History:  Diagnosis Date   AAA (abdominal aortic aneurysm) (HCC) 06/03/2007   [facility]; Dr. Hart Rochester   AICD (automatic cardioverter/defibrillator) present    Arrhythmia    atrial fibrillation   Barrett's esophagus    Bladder cancer (HCC)    Bradycardia    CAD (coronary artery disease)    CAP (community acquired pneumonia) 11/13/2019   CHF (congestive heart failure) (HCC)    Cluster headache    COVID-19 09/27/2021   DDD (degenerative disc disease), lumbar    Diabetes mellitus without complication (HCC)    Dyspnea    WITH  EXERTION   Edema    LEFT ANKLE   Fracture of skull base (HCC) 1997   due to fall   GERD (gastroesophageal reflux disease)    Gout    History of bladder cancer 12/1995   Hyperlipidemia    Hypertension    Hypocalcemia 04/19/2020   Possibly secondary to diuretics.   Low=5.9 04/19/2020   Malignant melanoma (HCC) 12/2012   right dorsal forearm excised   Myocardial infarction [facility])    LAST 2014   Osteoarthritis of knee    Other specified complication of vascular prosthetic devices, implants and grafts, initial encounter (HCC) 08/22/2021   Pacemaker 10/10/2006   Pancreatitis 11/22/2019   Pneumonia    2016   Pre-diabetes    Psoriasis    Rib fracture 1997   due to fall   Sleep apnea    CPAP   Stroke [facility])    Venous incompetence     Past Surgical History:  Procedure Laterality Date   ABDOMINAL AORTIC ANEURYSM REPAIR  06/03/2007   [facility]; Dr. Hart Rochester   ANGIOPLASTY  1994   MI   BLADDER TUMOR EXCISION  12/1995   CAROTID PTA/STENT INTERVENTION Left 07/23/2022   Procedure: CAROTID PTA/STENT INTERVENTION;  Surgeon: Renford Dills, MD;  Location: [facility] INVASIVE CV LAB;  Service: Cardiovascular;  Laterality: Left;   CATARACT EXTRACTION W/PHACO Left 10/22/2016   Procedure: CATARACT EXTRACTION PHACO AND INTRAOCULAR LENS PLACEMENT (IOC);  Surgeon: Galen Manila, MD;  Location: [facility] ORS;  Service: Ophthalmology;  Laterality: Left;  [place] 47.7 AP% 18.4 CDE 8.78 Fluid pack lot # [id number]  and tone for age. Extremities: Warm and well perfused. No clubbing, cyanosis. No edema.  Neuro: Alert and oriented X 3. Psych: Answers questions  appropriately.   Labs: Basic Metabolic Panel: Recent Labs    09/11/23 0330 09/11/23 1035 09/11/23 1754 09/12/23 0500  NA 132*   < > 132* 131*  K 6.0*   < > 5.0 4.4  CL 95*   < > 90* 90*  CO2 24   < > 26 26  GLUCOSE 90   < > 174* 142*  BUN 99*   < > 96* 104*  CREATININE 4.95*   < > 5.27* 5.17*  CALCIUM 8.7*   < > 9.1 8.8*  MG 2.3  --   --  2.3  PHOS 5.9*  --   --  7.0*   < > = values in this interval not displayed.   Liver Function Tests: Recent Labs    09/10/23 1606 09/11/23 0330  AST 28 23  ALT 10 9  ALKPHOS 141* 119  BILITOT 3.1* 2.6*  PROT 7.9 7.1  ALBUMIN 2.8* 2.8*   No results for input(s): "LIPASE", "AMYLASE" in the last 72 hours. CBC: Recent Labs    09/10/23 1606 09/11/23 0330 09/12/23 0500  WBC 13.9* 9.7 9.9  NEUTROABS 12.0*  --   --   HGB 12.3* 11.0* 11.1*  HCT 40.2 35.3* 35.0*  MCV 101.0* 98.9 97.0  PLT 133* 129* 152   Cardiac Enzymes: Recent Labs    09/10/23 1606 09/10/23 1801  CKTOTAL 52  --   TROPONINIHS 19* 15   BNP: Recent Labs    09/10/23 1606  BNP 647.1*   D-Dimer: No results for input(s): "DDIMER" in the last 72 hours. Hemoglobin A1C: Recent Labs    09/11/23 0006  HGBA1C 6.6*   Fasting Lipid Panel: Recent Labs    09/10/23 1606  CHOL 40  HDL 13*  LDLCALC 11  TRIG 79  CHOLHDL 3.1   Thyroid Function Tests: Recent Labs    09/10/23 1801  TSH 3.317   Anemia Panel: No results for input(s): "VITAMINB12", "FOLATE", "FERRITIN", "TIBC", "IRON", "RETICCTPCT" in the last 72 hours.   Radiology: US RENAL  Result Date: 09/11/2023 CLINICAL DATA:  Acute on chronic renal failure EXAM: RENAL / URINARY TRACT ULTRASOUND COMPLETE COMPARISON:  07/29/2023.  CT 09/10/2023 FINDINGS: Right Kidney: Renal measurements: 10.3 x 6.2 x 5.8 cm = volume: 192 mL. Echogenicity within normal limits. No mass or hydronephrosis visualized. Left Kidney: Renal measurements: 7.0 x 3.5 x 2.8 cm = volume: 37 mL. Atrophic with cortical thinning. No mass  or hydronephrosis. Bladder: Decompressed with Foley catheter in place. Other: Moderate  ascites in the abdomen and pelvis. IMPRESSION: No acute findings. Atrophic left kidney, stable. Moderate ascites. Electronically Signed   By: Charlett Nose M.D.   On: 09/11/2023 19:18   ECHOCARDIOGRAM COMPLETE  Result Date: 09/11/2023    ECHOCARDIOGRAM REPORT   Patient Name:   ABHISHEK HUNSICKER Date of Exam: 09/11/2023 Medical Rec #:  [id number]    Height:       70.0 in Accession #:    [id number]   Weight:       158.3 lb Date of Birth:  [birth year]   BSA:          1.890 m Patient Age:    81 years     BP:           103/69 mmHg Patient Gender: M  and tone for age. Extremities: Warm and well perfused. No clubbing, cyanosis. No edema.  Neuro: Alert and oriented X 3. Psych: Answers questions  appropriately.   Labs: Basic Metabolic Panel: Recent Labs    09/11/23 0330 09/11/23 1035 09/11/23 1754 09/12/23 0500  NA 132*   < > 132* 131*  K 6.0*   < > 5.0 4.4  CL 95*   < > 90* 90*  CO2 24   < > 26 26  GLUCOSE 90   < > 174* 142*  BUN 99*   < > 96* 104*  CREATININE 4.95*   < > 5.27* 5.17*  CALCIUM 8.7*   < > 9.1 8.8*  MG 2.3  --   --  2.3  PHOS 5.9*  --   --  7.0*   < > = values in this interval not displayed.   Liver Function Tests: Recent Labs    09/10/23 1606 09/11/23 0330  AST 28 23  ALT 10 9  ALKPHOS 141* 119  BILITOT 3.1* 2.6*  PROT 7.9 7.1  ALBUMIN 2.8* 2.8*   No results for input(s): "LIPASE", "AMYLASE" in the last 72 hours. CBC: Recent Labs    09/10/23 1606 09/11/23 0330 09/12/23 0500  WBC 13.9* 9.7 9.9  NEUTROABS 12.0*  --   --   HGB 12.3* 11.0* 11.1*  HCT 40.2 35.3* 35.0*  MCV 101.0* 98.9 97.0  PLT 133* 129* 152   Cardiac Enzymes: Recent Labs    09/10/23 1606 09/10/23 1801  CKTOTAL 52  --   TROPONINIHS 19* 15   BNP: Recent Labs    09/10/23 1606  BNP 647.1*   D-Dimer: No results for input(s): "DDIMER" in the last 72 hours. Hemoglobin A1C: Recent Labs    09/11/23 0006  HGBA1C 6.6*   Fasting Lipid Panel: Recent Labs    09/10/23 1606  CHOL 40  HDL 13*  LDLCALC 11  TRIG 79  CHOLHDL 3.1   Thyroid Function Tests: Recent Labs    09/10/23 1801  TSH 3.317   Anemia Panel: No results for input(s): "VITAMINB12", "FOLATE", "FERRITIN", "TIBC", "IRON", "RETICCTPCT" in the last 72 hours.   Radiology: US RENAL  Result Date: 09/11/2023 CLINICAL DATA:  Acute on chronic renal failure EXAM: RENAL / URINARY TRACT ULTRASOUND COMPLETE COMPARISON:  07/29/2023.  CT 09/10/2023 FINDINGS: Right Kidney: Renal measurements: 10.3 x 6.2 x 5.8 cm = volume: 192 mL. Echogenicity within normal limits. No mass or hydronephrosis visualized. Left Kidney: Renal measurements: 7.0 x 3.5 x 2.8 cm = volume: 37 mL. Atrophic with cortical thinning. No mass  or hydronephrosis. Bladder: Decompressed with Foley catheter in place. Other: Moderate  ascites in the abdomen and pelvis. IMPRESSION: No acute findings. Atrophic left kidney, stable. Moderate ascites. Electronically Signed   By: Charlett Nose M.D.   On: 09/11/2023 19:18   ECHOCARDIOGRAM COMPLETE  Result Date: 09/11/2023    ECHOCARDIOGRAM REPORT   Patient Name:   ABHISHEK HUNSICKER Date of Exam: 09/11/2023 Medical Rec #:  [id number]    Height:       70.0 in Accession #:    [id number]   Weight:       158.3 lb Date of Birth:  [birth year]   BSA:          1.890 m Patient Age:    81 years     BP:           103/69 mmHg Patient Gender: M  [facility] CLINIC CARDIOLOGY Progress NOTE       Patient ID: Taylor [name] MRN: [id number] DOB/AGE: 1941/04/03 82 y.o.  Admit date: 09/10/2023 Referring Physician Dr.  Valera Castle Physician Dr. Zada Girt Primary Cardiologist Dr. Marcina Millard Reason for Consultation Chronic Heart Failure, EF < 20%  HPI: Taylor [name] is a 82 y.o. male  with a past medical history of paroxysmal atrial fibrillation (Eliquis), chronic HFrEF (EF < 20%), s/p CABG (08/2006), s/p dual-chamber ICD (2014), SSS, HTN, HLD, type 2 diabetes, pancreatitis, ischemic cardiomyopathy, ventricular fibrillation arrest (1994) s/p MI infarction, bilateral carotid artery stenosis s/p left carotid stent (06/2022), abdominal aortic aneurysm s/p endovascular repair (2008), CKD stage 4, COPD (baseline 2L [place]) liver cirrhosis with ascites (CT 08/2023), who presented to the ED on 09/10/2023 for frequent falls and generalized weakness. Cardiology was consulted for further evaluation.   Interval History: - Patient is laying at an incline in hospital bed with wife at bedside. Patient states he is doing "fair" and denies chest pain or palpitations. Patient states he is coughing which is not new for him. - There has been no change from cardiac perspective. No plan for further cardiac diagnostics.  Review of systems complete and found to be negative unless listed above    Past Medical History:  Diagnosis Date   AAA (abdominal aortic aneurysm) (HCC) 06/03/2007   [facility]; Dr. Hart Rochester   AICD (automatic cardioverter/defibrillator) present    Arrhythmia    atrial fibrillation   Barrett's esophagus    Bladder cancer (HCC)    Bradycardia    CAD (coronary artery disease)    CAP (community acquired pneumonia) 11/13/2019   CHF (congestive heart failure) (HCC)    Cluster headache    COVID-19 09/27/2021   DDD (degenerative disc disease), lumbar    Diabetes mellitus without complication (HCC)    Dyspnea    WITH  EXERTION   Edema    LEFT ANKLE   Fracture of skull base (HCC) 1997   due to fall   GERD (gastroesophageal reflux disease)    Gout    History of bladder cancer 12/1995   Hyperlipidemia    Hypertension    Hypocalcemia 04/19/2020   Possibly secondary to diuretics.   Low=5.9 04/19/2020   Malignant melanoma (HCC) 12/2012   right dorsal forearm excised   Myocardial infarction [facility])    LAST 2014   Osteoarthritis of knee    Other specified complication of vascular prosthetic devices, implants and grafts, initial encounter (HCC) 08/22/2021   Pacemaker 10/10/2006   Pancreatitis 11/22/2019   Pneumonia    2016   Pre-diabetes    Psoriasis    Rib fracture 1997   due to fall   Sleep apnea    CPAP   Stroke [facility])    Venous incompetence     Past Surgical History:  Procedure Laterality Date   ABDOMINAL AORTIC ANEURYSM REPAIR  06/03/2007   [facility]; Dr. Hart Rochester   ANGIOPLASTY  1994   MI   BLADDER TUMOR EXCISION  12/1995   CAROTID PTA/STENT INTERVENTION Left 07/23/2022   Procedure: CAROTID PTA/STENT INTERVENTION;  Surgeon: Renford Dills, MD;  Location: [facility] INVASIVE CV LAB;  Service: Cardiovascular;  Laterality: Left;   CATARACT EXTRACTION W/PHACO Left 10/22/2016   Procedure: CATARACT EXTRACTION PHACO AND INTRAOCULAR LENS PLACEMENT (IOC);  Surgeon: Galen Manila, MD;  Location: [facility] ORS;  Service: Ophthalmology;  Laterality: Left;  [place] 47.7 AP% 18.4 CDE 8.78 Fluid pack lot # [id number]  [facility] CLINIC CARDIOLOGY Progress NOTE       Patient ID: Taylor [name] MRN: [id number] DOB/AGE: 1941/04/03 82 y.o.  Admit date: 09/10/2023 Referring Physician Dr.  Valera Castle Physician Dr. Zada Girt Primary Cardiologist Dr. Marcina Millard Reason for Consultation Chronic Heart Failure, EF < 20%  HPI: Taylor [name] is a 82 y.o. male  with a past medical history of paroxysmal atrial fibrillation (Eliquis), chronic HFrEF (EF < 20%), s/p CABG (08/2006), s/p dual-chamber ICD (2014), SSS, HTN, HLD, type 2 diabetes, pancreatitis, ischemic cardiomyopathy, ventricular fibrillation arrest (1994) s/p MI infarction, bilateral carotid artery stenosis s/p left carotid stent (06/2022), abdominal aortic aneurysm s/p endovascular repair (2008), CKD stage 4, COPD (baseline 2L [place]) liver cirrhosis with ascites (CT 08/2023), who presented to the ED on 09/10/2023 for frequent falls and generalized weakness. Cardiology was consulted for further evaluation.   Interval History: - Patient is laying at an incline in hospital bed with wife at bedside. Patient states he is doing "fair" and denies chest pain or palpitations. Patient states he is coughing which is not new for him. - There has been no change from cardiac perspective. No plan for further cardiac diagnostics.  Review of systems complete and found to be negative unless listed above    Past Medical History:  Diagnosis Date   AAA (abdominal aortic aneurysm) (HCC) 06/03/2007   [facility]; Dr. Hart Rochester   AICD (automatic cardioverter/defibrillator) present    Arrhythmia    atrial fibrillation   Barrett's esophagus    Bladder cancer (HCC)    Bradycardia    CAD (coronary artery disease)    CAP (community acquired pneumonia) 11/13/2019   CHF (congestive heart failure) (HCC)    Cluster headache    COVID-19 09/27/2021   DDD (degenerative disc disease), lumbar    Diabetes mellitus without complication (HCC)    Dyspnea    WITH  EXERTION   Edema    LEFT ANKLE   Fracture of skull base (HCC) 1997   due to fall   GERD (gastroesophageal reflux disease)    Gout    History of bladder cancer 12/1995   Hyperlipidemia    Hypertension    Hypocalcemia 04/19/2020   Possibly secondary to diuretics.   Low=5.9 04/19/2020   Malignant melanoma (HCC) 12/2012   right dorsal forearm excised   Myocardial infarction [facility])    LAST 2014   Osteoarthritis of knee    Other specified complication of vascular prosthetic devices, implants and grafts, initial encounter (HCC) 08/22/2021   Pacemaker 10/10/2006   Pancreatitis 11/22/2019   Pneumonia    2016   Pre-diabetes    Psoriasis    Rib fracture 1997   due to fall   Sleep apnea    CPAP   Stroke [facility])    Venous incompetence     Past Surgical History:  Procedure Laterality Date   ABDOMINAL AORTIC ANEURYSM REPAIR  06/03/2007   [facility]; Dr. Hart Rochester   ANGIOPLASTY  1994   [place]   BLADDER TUMOR EXCISION  12/1995   CAROTID PTA/STENT INTERVENTION Left 07/23/2022   Procedure: CAROTID PTA/STENT INTERVENTION;  Surgeon: Renford Dills, MD;  Location: [facility] INVASIVE CV LAB;  Service: Cardiovascular;  Laterality: Left;   CATARACT EXTRACTION W/PHACO Left 10/22/2016   Procedure: CATARACT EXTRACTION PHACO AND INTRAOCULAR LENS PLACEMENT (IOC);  Surgeon: Galen Manila, MD;  Location: [facility] ORS;  Service: Ophthalmology;  Laterality: Left;  [place] 47.7 AP% 18.4 CDE 8.78 Fluid pack lot # [id number]  and tone for age. Extremities: Warm and well perfused. No clubbing, cyanosis. No edema.  Neuro: Alert and oriented X 3. Psych: Answers questions  appropriately.   Labs: Basic Metabolic Panel: Recent Labs    09/11/23 0330 09/11/23 1035 09/11/23 1754 09/12/23 0500  NA 132*   < > 132* 131*  K 6.0*   < > 5.0 4.4  CL 95*   < > 90* 90*  CO2 24   < > 26 26  GLUCOSE 90   < > 174* 142*  BUN 99*   < > 96* 104*  CREATININE 4.95*   < > 5.27* 5.17*  CALCIUM 8.7*   < > 9.1 8.8*  MG 2.3  --   --  2.3  PHOS 5.9*  --   --  7.0*   < > = values in this interval not displayed.   Liver Function Tests: Recent Labs    09/10/23 1606 09/11/23 0330  AST 28 23  ALT 10 9  ALKPHOS 141* 119  BILITOT 3.1* 2.6*  PROT 7.9 7.1  ALBUMIN 2.8* 2.8*   No results for input(s): "LIPASE", "AMYLASE" in the last 72 hours. CBC: Recent Labs    09/10/23 1606 09/11/23 0330 09/12/23 0500  WBC 13.9* 9.7 9.9  NEUTROABS 12.0*  --   --   HGB 12.3* 11.0* 11.1*  HCT 40.2 35.3* 35.0*  MCV 101.0* 98.9 97.0  PLT 133* 129* 152   Cardiac Enzymes: Recent Labs    09/10/23 1606 09/10/23 1801  CKTOTAL 52  --   TROPONINIHS 19* 15   BNP: Recent Labs    09/10/23 1606  BNP 647.1*   D-Dimer: No results for input(s): "DDIMER" in the last 72 hours. Hemoglobin A1C: Recent Labs    09/11/23 0006  HGBA1C 6.6*   Fasting Lipid Panel: Recent Labs    09/10/23 1606  CHOL 40  HDL 13*  LDLCALC 11  TRIG 79  CHOLHDL 3.1   Thyroid Function Tests: Recent Labs    09/10/23 1801  TSH 3.317   Anemia Panel: No results for input(s): "VITAMINB12", "FOLATE", "FERRITIN", "TIBC", "IRON", "RETICCTPCT" in the last 72 hours.   Radiology: US RENAL  Result Date: 09/11/2023 CLINICAL DATA:  Acute on chronic renal failure EXAM: RENAL / URINARY TRACT ULTRASOUND COMPLETE COMPARISON:  07/29/2023.  CT 09/10/2023 FINDINGS: Right Kidney: Renal measurements: 10.3 x 6.2 x 5.8 cm = volume: 192 mL. Echogenicity within normal limits. No mass or hydronephrosis visualized. Left Kidney: Renal measurements: 7.0 x 3.5 x 2.8 cm = volume: 37 mL. Atrophic with cortical thinning. No mass  or hydronephrosis. Bladder: Decompressed with Foley catheter in place. Other: Moderate  ascites in the abdomen and pelvis. IMPRESSION: No acute findings. Atrophic left kidney, stable. Moderate ascites. Electronically Signed   By: Charlett Nose M.D.   On: 09/11/2023 19:18   ECHOCARDIOGRAM COMPLETE  Result Date: 09/11/2023    ECHOCARDIOGRAM REPORT   Patient Name:   ABHISHEK HUNSICKER Date of Exam: 09/11/2023 Medical Rec #:  [id number]    Height:       70.0 in Accession #:    [id number]   Weight:       158.3 lb Date of Birth:  [birth year]   BSA:          1.890 m Patient Age:    81 years     BP:           103/69 mmHg Patient Gender: M  and tone for age. Extremities: Warm and well perfused. No clubbing, cyanosis. No edema.  Neuro: Alert and oriented X 3. Psych: Answers questions  appropriately.   Labs: Basic Metabolic Panel: Recent Labs    09/11/23 0330 09/11/23 1035 09/11/23 1754 09/12/23 0500  NA 132*   < > 132* 131*  K 6.0*   < > 5.0 4.4  CL 95*   < > 90* 90*  CO2 24   < > 26 26  GLUCOSE 90   < > 174* 142*  BUN 99*   < > 96* 104*  CREATININE 4.95*   < > 5.27* 5.17*  CALCIUM 8.7*   < > 9.1 8.8*  MG 2.3  --   --  2.3  PHOS 5.9*  --   --  7.0*   < > = values in this interval not displayed.   Liver Function Tests: Recent Labs    09/10/23 1606 09/11/23 0330  AST 28 23  ALT 10 9  ALKPHOS 141* 119  BILITOT 3.1* 2.6*  PROT 7.9 7.1  ALBUMIN 2.8* 2.8*   No results for input(s): "LIPASE", "AMYLASE" in the last 72 hours. CBC: Recent Labs    09/10/23 1606 09/11/23 0330 09/12/23 0500  WBC 13.9* 9.7 9.9  NEUTROABS 12.0*  --   --   HGB 12.3* 11.0* 11.1*  HCT 40.2 35.3* 35.0*  MCV 101.0* 98.9 97.0  PLT 133* 129* 152   Cardiac Enzymes: Recent Labs    09/10/23 1606 09/10/23 1801  CKTOTAL 52  --   TROPONINIHS 19* 15   BNP: Recent Labs    09/10/23 1606  BNP 647.1*   D-Dimer: No results for input(s): "DDIMER" in the last 72 hours. Hemoglobin A1C: Recent Labs    09/11/23 0006  HGBA1C 6.6*   Fasting Lipid Panel: Recent Labs    09/10/23 1606  CHOL 40  HDL 13*  LDLCALC 11  TRIG 79  CHOLHDL 3.1   Thyroid Function Tests: Recent Labs    09/10/23 1801  TSH 3.317   Anemia Panel: No results for input(s): "VITAMINB12", "FOLATE", "FERRITIN", "TIBC", "IRON", "RETICCTPCT" in the last 72 hours.   Radiology: US RENAL  Result Date: 09/11/2023 CLINICAL DATA:  Acute on chronic renal failure EXAM: RENAL / URINARY TRACT ULTRASOUND COMPLETE COMPARISON:  07/29/2023.  CT 09/10/2023 FINDINGS: Right Kidney: Renal measurements: 10.3 x 6.2 x 5.8 cm = volume: 192 mL. Echogenicity within normal limits. No mass or hydronephrosis visualized. Left Kidney: Renal measurements: 7.0 x 3.5 x 2.8 cm = volume: 37 mL. Atrophic with cortical thinning. No mass  or hydronephrosis. Bladder: Decompressed with Foley catheter in place. Other: Moderate  ascites in the abdomen and pelvis. IMPRESSION: No acute findings. Atrophic left kidney, stable. Moderate ascites. Electronically Signed   By: Charlett Nose M.D.   On: 09/11/2023 19:18   ECHOCARDIOGRAM COMPLETE  Result Date: 09/11/2023    ECHOCARDIOGRAM REPORT   Patient Name:   ABHISHEK HUNSICKER Date of Exam: 09/11/2023 Medical Rec #:  [id number]    Height:       70.0 in Accession #:    [id number]   Weight:       158.3 lb Date of Birth:  [birth year]   BSA:          1.890 m Patient Age:    81 years     BP:           103/69 mmHg Patient Gender: M  and tone for age. Extremities: Warm and well perfused. No clubbing, cyanosis. No edema.  Neuro: Alert and oriented X 3. Psych: Answers questions  appropriately.   Labs: Basic Metabolic Panel: Recent Labs    09/11/23 0330 09/11/23 1035 09/11/23 1754 09/12/23 0500  NA 132*   < > 132* 131*  K 6.0*   < > 5.0 4.4  CL 95*   < > 90* 90*  CO2 24   < > 26 26  GLUCOSE 90   < > 174* 142*  BUN 99*   < > 96* 104*  CREATININE 4.95*   < > 5.27* 5.17*  CALCIUM 8.7*   < > 9.1 8.8*  MG 2.3  --   --  2.3  PHOS 5.9*  --   --  7.0*   < > = values in this interval not displayed.   Liver Function Tests: Recent Labs    09/10/23 1606 09/11/23 0330  AST 28 23  ALT 10 9  ALKPHOS 141* 119  BILITOT 3.1* 2.6*  PROT 7.9 7.1  ALBUMIN 2.8* 2.8*   No results for input(s): "LIPASE", "AMYLASE" in the last 72 hours. CBC: Recent Labs    09/10/23 1606 09/11/23 0330 09/12/23 0500  WBC 13.9* 9.7 9.9  NEUTROABS 12.0*  --   --   HGB 12.3* 11.0* 11.1*  HCT 40.2 35.3* 35.0*  MCV 101.0* 98.9 97.0  PLT 133* 129* 152   Cardiac Enzymes: Recent Labs    09/10/23 1606 09/10/23 1801  CKTOTAL 52  --   TROPONINIHS 19* 15   BNP: Recent Labs    09/10/23 1606  BNP 647.1*   D-Dimer: No results for input(s): "DDIMER" in the last 72 hours. Hemoglobin A1C: Recent Labs    09/11/23 0006  HGBA1C 6.6*   Fasting Lipid Panel: Recent Labs    09/10/23 1606  CHOL 40  HDL 13*  LDLCALC 11  TRIG 79  CHOLHDL 3.1   Thyroid Function Tests: Recent Labs    09/10/23 1801  TSH 3.317   Anemia Panel: No results for input(s): "VITAMINB12", "FOLATE", "FERRITIN", "TIBC", "IRON", "RETICCTPCT" in the last 72 hours.   Radiology: US RENAL  Result Date: 09/11/2023 CLINICAL DATA:  Acute on chronic renal failure EXAM: RENAL / URINARY TRACT ULTRASOUND COMPLETE COMPARISON:  07/29/2023.  CT 09/10/2023 FINDINGS: Right Kidney: Renal measurements: 10.3 x 6.2 x 5.8 cm = volume: 192 mL. Echogenicity within normal limits. No mass or hydronephrosis visualized. Left Kidney: Renal measurements: 7.0 x 3.5 x 2.8 cm = volume: 37 mL. Atrophic with cortical thinning. No mass  or hydronephrosis. Bladder: Decompressed with Foley catheter in place. Other: Moderate  ascites in the abdomen and pelvis. IMPRESSION: No acute findings. Atrophic left kidney, stable. Moderate ascites. Electronically Signed   By: Charlett Nose M.D.   On: 09/11/2023 19:18   ECHOCARDIOGRAM COMPLETE  Result Date: 09/11/2023    ECHOCARDIOGRAM REPORT   Patient Name:   ABHISHEK HUNSICKER Date of Exam: 09/11/2023 Medical Rec #:  [id number]    Height:       70.0 in Accession #:    [id number]   Weight:       158.3 lb Date of Birth:  [birth year]   BSA:          1.890 m Patient Age:    81 years     BP:           103/69 mmHg Patient Gender: M  and tone for age. Extremities: Warm and well perfused. No clubbing, cyanosis. No edema.  Neuro: Alert and oriented X 3. Psych: Answers questions  appropriately.   Labs: Basic Metabolic Panel: Recent Labs    09/11/23 0330 09/11/23 1035 09/11/23 1754 09/12/23 0500  NA 132*   < > 132* 131*  K 6.0*   < > 5.0 4.4  CL 95*   < > 90* 90*  CO2 24   < > 26 26  GLUCOSE 90   < > 174* 142*  BUN 99*   < > 96* 104*  CREATININE 4.95*   < > 5.27* 5.17*  CALCIUM 8.7*   < > 9.1 8.8*  MG 2.3  --   --  2.3  PHOS 5.9*  --   --  7.0*   < > = values in this interval not displayed.   Liver Function Tests: Recent Labs    09/10/23 1606 09/11/23 0330  AST 28 23  ALT 10 9  ALKPHOS 141* 119  BILITOT 3.1* 2.6*  PROT 7.9 7.1  ALBUMIN 2.8* 2.8*   No results for input(s): "LIPASE", "AMYLASE" in the last 72 hours. CBC: Recent Labs    09/10/23 1606 09/11/23 0330 09/12/23 0500  WBC 13.9* 9.7 9.9  NEUTROABS 12.0*  --   --   HGB 12.3* 11.0* 11.1*  HCT 40.2 35.3* 35.0*  MCV 101.0* 98.9 97.0  PLT 133* 129* 152   Cardiac Enzymes: Recent Labs    09/10/23 1606 09/10/23 1801  CKTOTAL 52  --   TROPONINIHS 19* 15   BNP: Recent Labs    09/10/23 1606  BNP 647.1*   D-Dimer: No results for input(s): "DDIMER" in the last 72 hours. Hemoglobin A1C: Recent Labs    09/11/23 0006  HGBA1C 6.6*   Fasting Lipid Panel: Recent Labs    09/10/23 1606  CHOL 40  HDL 13*  LDLCALC 11  TRIG 79  CHOLHDL 3.1   Thyroid Function Tests: Recent Labs    09/10/23 1801  TSH 3.317   Anemia Panel: No results for input(s): "VITAMINB12", "FOLATE", "FERRITIN", "TIBC", "IRON", "RETICCTPCT" in the last 72 hours.   Radiology: US RENAL  Result Date: 09/11/2023 CLINICAL DATA:  Acute on chronic renal failure EXAM: RENAL / URINARY TRACT ULTRASOUND COMPLETE COMPARISON:  07/29/2023.  CT 09/10/2023 FINDINGS: Right Kidney: Renal measurements: 10.3 x 6.2 x 5.8 cm = volume: 192 mL. Echogenicity within normal limits. No mass or hydronephrosis visualized. Left Kidney: Renal measurements: 7.0 x 3.5 x 2.8 cm = volume: 37 mL. Atrophic with cortical thinning. No mass  or hydronephrosis. Bladder: Decompressed with Foley catheter in place. Other: Moderate  ascites in the abdomen and pelvis. IMPRESSION: No acute findings. Atrophic left kidney, stable. Moderate ascites. Electronically Signed   By: Charlett Nose M.D.   On: 09/11/2023 19:18   ECHOCARDIOGRAM COMPLETE  Result Date: 09/11/2023    ECHOCARDIOGRAM REPORT   Patient Name:   ABHISHEK HUNSICKER Date of Exam: 09/11/2023 Medical Rec #:  [id number]    Height:       70.0 in Accession #:    [id number]   Weight:       158.3 lb Date of Birth:  [birth year]   BSA:          1.890 m Patient Age:    81 years     BP:           103/69 mmHg Patient Gender: M  [facility] CLINIC CARDIOLOGY Progress NOTE       Patient ID: Taylor [name] MRN: [id number] DOB/AGE: 1941/04/03 82 y.o.  Admit date: 09/10/2023 Referring Physician Dr.  Valera Castle Physician Dr. Zada Girt Primary Cardiologist Dr. Marcina Millard Reason for Consultation Chronic Heart Failure, EF < 20%  HPI: Taylor [name] is a 82 y.o. male  with a past medical history of paroxysmal atrial fibrillation (Eliquis), chronic HFrEF (EF < 20%), s/p CABG (08/2006), s/p dual-chamber ICD (2014), SSS, HTN, HLD, type 2 diabetes, pancreatitis, ischemic cardiomyopathy, ventricular fibrillation arrest (1994) s/p MI infarction, bilateral carotid artery stenosis s/p left carotid stent (06/2022), abdominal aortic aneurysm s/p endovascular repair (2008), CKD stage 4, COPD (baseline 2L [place]) liver cirrhosis with ascites (CT 08/2023), who presented to the ED on 09/10/2023 for frequent falls and generalized weakness. Cardiology was consulted for further evaluation.   Interval History: - Patient is laying at an incline in hospital bed with wife at bedside. Patient states he is doing "fair" and denies chest pain or palpitations. Patient states he is coughing which is not new for him. - There has been no change from cardiac perspective. No plan for further cardiac diagnostics.  Review of systems complete and found to be negative unless listed above    Past Medical History:  Diagnosis Date   AAA (abdominal aortic aneurysm) (HCC) 06/03/2007   [facility]; Dr. Hart [place]   AICD (automatic cardioverter/defibrillator) present    Arrhythmia    atrial fibrillation   Barrett's esophagus    Bladder cancer (HCC)    Bradycardia    CAD (coronary artery disease)    CAP (community acquired pneumonia) 11/13/2019   CHF (congestive heart failure) (HCC)    Cluster headache    COVID-19 09/27/2021   DDD (degenerative disc disease), lumbar    Diabetes mellitus without complication (HCC)    Dyspnea    WITH  EXERTION   Edema    LEFT ANKLE   Fracture of skull base (HCC) 1997   due to fall   GERD (gastroesophageal reflux disease)    Gout    History of bladder cancer 12/1995   Hyperlipidemia    Hypertension    Hypocalcemia 04/19/2020   Possibly secondary to diuretics.   Low=5.9 04/19/2020   Malignant melanoma (HCC) 12/2012   right dorsal forearm excised   Myocardial infarction [facility])    LAST 2014   Osteoarthritis of knee    Other specified complication of vascular prosthetic devices, implants and grafts, initial encounter (HCC) 08/22/2021   Pacemaker 10/10/2006   Pancreatitis 11/22/2019   Pneumonia    2016   Pre-diabetes    Psoriasis    Rib fracture 1997   due to fall   Sleep apnea    CPAP   Stroke [facility])    Venous incompetence     Past Surgical History:  Procedure Laterality Date   ABDOMINAL AORTIC ANEURYSM REPAIR  06/03/2007   [facility]; Dr. Hart [place]   ANGIOPLASTY  1994   MI   BLADDER TUMOR EXCISION  12/1995   CAROTID PTA/STENT INTERVENTION Left 07/23/2022   Procedure: CAROTID PTA/STENT INTERVENTION;  Surgeon: Renford Dills, MD;  Location: [facility] INVASIVE CV LAB;  Service: Cardiovascular;  Laterality: Left;   CATARACT EXTRACTION W/PHACO Left 10/22/2016   Procedure: CATARACT EXTRACTION PHACO AND INTRAOCULAR LENS PLACEMENT (IOC);  Surgeon: Galen Manila, MD;  Location: [facility] ORS;  Service: Ophthalmology;  Laterality: Left;  [place] 47.7 AP% 18.4 CDE 8.78 Fluid pack lot # [id number]  and tone for age. Extremities: Warm and well perfused. No clubbing, cyanosis. No edema.  Neuro: Alert and oriented X 3. Psych: Answers questions  appropriately.   Labs: Basic Metabolic Panel: Recent Labs    09/11/23 0330 09/11/23 1035 09/11/23 1754 09/12/23 0500  NA 132*   < > 132* 131*  K 6.0*   < > 5.0 4.4  CL 95*   < > 90* 90*  CO2 24   < > 26 26  GLUCOSE 90   < > 174* 142*  BUN 99*   < > 96* 104*  CREATININE 4.95*   < > 5.27* 5.17*  CALCIUM 8.7*   < > 9.1 8.8*  MG 2.3  --   --  2.3  PHOS 5.9*  --   --  7.0*   < > = values in this interval not displayed.   Liver Function Tests: Recent Labs    09/10/23 1606 09/11/23 0330  AST 28 23  ALT 10 9  ALKPHOS 141* 119  BILITOT 3.1* 2.6*  PROT 7.9 7.1  ALBUMIN 2.8* 2.8*   No results for input(s): "LIPASE", "AMYLASE" in the last 72 hours. CBC: Recent Labs    09/10/23 1606 09/11/23 0330 09/12/23 0500  WBC 13.9* 9.7 9.9  NEUTROABS 12.0*  --   --   HGB 12.3* 11.0* 11.1*  HCT 40.2 35.3* 35.0*  MCV 101.0* 98.9 97.0  PLT 133* 129* 152   Cardiac Enzymes: Recent Labs    09/10/23 1606 09/10/23 1801  CKTOTAL 52  --   TROPONINIHS 19* 15   BNP: Recent Labs    09/10/23 1606  BNP 647.1*   D-Dimer: No results for input(s): "DDIMER" in the last 72 hours. Hemoglobin A1C: Recent Labs    09/11/23 0006  HGBA1C 6.6*   Fasting Lipid Panel: Recent Labs    09/10/23 1606  CHOL 40  HDL 13*  LDLCALC 11  TRIG 79  CHOLHDL 3.1   Thyroid Function Tests: Recent Labs    09/10/23 1801  TSH 3.317   Anemia Panel: No results for input(s): "VITAMINB12", "FOLATE", "FERRITIN", "TIBC", "IRON", "RETICCTPCT" in the last 72 hours.   Radiology: US RENAL  Result Date: 09/11/2023 CLINICAL DATA:  Acute on chronic renal failure EXAM: RENAL / URINARY TRACT ULTRASOUND COMPLETE COMPARISON:  07/29/2023.  CT 09/10/2023 FINDINGS: Right Kidney: Renal measurements: 10.3 x 6.2 x 5.8 cm = volume: 192 mL. Echogenicity within normal limits. No mass or hydronephrosis visualized. Left Kidney: Renal measurements: 7.0 x 3.5 x 2.8 cm = volume: 37 mL. Atrophic with cortical thinning. No mass  or hydronephrosis. Bladder: Decompressed with Foley catheter in place. Other: Moderate  ascites in the abdomen and pelvis. IMPRESSION: No acute findings. Atrophic left kidney, stable. Moderate ascites. Electronically Signed   By: Charlett Nose M.D.   On: 09/11/2023 19:18   ECHOCARDIOGRAM COMPLETE  Result Date: 09/11/2023    ECHOCARDIOGRAM REPORT   Patient Name:   ABHISHEK HUNSICKER Date of Exam: 09/11/2023 Medical Rec #:  [id number]    Height:       70.0 in Accession #:    [id number]   Weight:       158.3 lb Date of Birth:  [birth year]   BSA:          1.890 m Patient Age:    81 years     BP:           103/69 mmHg Patient Gender: M  and tone for age. Extremities: Warm and well perfused. No clubbing, cyanosis. No edema.  Neuro: Alert and oriented X 3. Psych: Answers questions  appropriately.   Labs: Basic Metabolic Panel: Recent Labs    09/11/23 0330 09/11/23 1035 09/11/23 1754 09/12/23 0500  NA 132*   < > 132* 131*  K 6.0*   < > 5.0 4.4  CL 95*   < > 90* 90*  CO2 24   < > 26 26  GLUCOSE 90   < > 174* 142*  BUN 99*   < > 96* 104*  CREATININE 4.95*   < > 5.27* 5.17*  CALCIUM 8.7*   < > 9.1 8.8*  MG 2.3  --   --  2.3  PHOS 5.9*  --   --  7.0*   < > = values in this interval not displayed.   Liver Function Tests: Recent Labs    09/10/23 1606 09/11/23 0330  AST 28 23  ALT 10 9  ALKPHOS 141* 119  BILITOT 3.1* 2.6*  PROT 7.9 7.1  ALBUMIN 2.8* 2.8*   No results for input(s): "LIPASE", "AMYLASE" in the last 72 hours. CBC: Recent Labs    09/10/23 1606 09/11/23 0330 09/12/23 0500  WBC 13.9* 9.7 9.9  NEUTROABS 12.0*  --   --   HGB 12.3* 11.0* 11.1*  HCT 40.2 35.3* 35.0*  MCV 101.0* 98.9 97.0  PLT 133* 129* 152   Cardiac Enzymes: Recent Labs    09/10/23 1606 09/10/23 1801  CKTOTAL 52  --   TROPONINIHS 19* 15   BNP: Recent Labs    09/10/23 1606  BNP 647.1*   D-Dimer: No results for input(s): "DDIMER" in the last 72 hours. Hemoglobin A1C: Recent Labs    09/11/23 0006  HGBA1C 6.6*   Fasting Lipid Panel: Recent Labs    09/10/23 1606  CHOL 40  HDL 13*  LDLCALC 11  TRIG 79  CHOLHDL 3.1   Thyroid Function Tests: Recent Labs    09/10/23 1801  TSH 3.317   Anemia Panel: No results for input(s): "VITAMINB12", "FOLATE", "FERRITIN", "TIBC", "IRON", "RETICCTPCT" in the last 72 hours.   Radiology: US RENAL  Result Date: 09/11/2023 CLINICAL DATA:  Acute on chronic renal failure EXAM: RENAL / URINARY TRACT ULTRASOUND COMPLETE COMPARISON:  07/29/2023.  CT 09/10/2023 FINDINGS: Right Kidney: Renal measurements: 10.3 x 6.2 x 5.8 cm = volume: 192 mL. Echogenicity within normal limits. No mass or hydronephrosis visualized. Left Kidney: Renal measurements: 7.0 x 3.5 x 2.8 cm = volume: 37 mL. Atrophic with cortical thinning. No mass  or hydronephrosis. Bladder: Decompressed with Foley catheter in place. Other: Moderate  ascites in the abdomen and pelvis. IMPRESSION: No acute findings. Atrophic left kidney, stable. Moderate ascites. Electronically Signed   By: Charlett Nose M.D.   On: 09/11/2023 19:18   ECHOCARDIOGRAM COMPLETE  Result Date: 09/11/2023    ECHOCARDIOGRAM REPORT   Patient Name:   ABHISHEK HUNSICKER Date of Exam: 09/11/2023 Medical Rec #:  [id number]    Height:       70.0 in Accession #:    [id number]   Weight:       158.3 lb Date of Birth:  [birth year]   BSA:          1.890 m Patient Age:    81 years     BP:           103/69 mmHg Patient Gender: M

## 2023-09-12 NOTE — Progress Notes (Signed)
[facility], [place] 09/12/23  Subjective:   Hospital day # 2 Patient with medical problems of sCHF with EF < 20%, CKD-4, HTN, HLD, DM, COPD on 2L O2, CAD with stent and hx of CABG, stroke, A fib on Eliquis, s/p of AICD, melanoma, psoriatic arthritis, pancreatitis, liver cirrhosis with ascites (shown by recent CT scan on 08/29/23)  Known to our practice from outpatient f/u of CKD  Patient seen laying in bed, wife at bedside Alert and oriented Tolerating meals  Room air  Renal: 10/17 0701 - 10/18 0700 In: 732.6 [P.O.:340; I.V.:286.7; IV Piggyback:55.9] Out: 380 [Urine:380] Lab Results  Component Value Date   CREATININE 5.17 (H) 09/12/2023   CREATININE 5.27 (H) 09/11/2023   CREATININE 5.17 (H) 09/11/2023     Objective:  Vital signs in last 24 hours:  Temp:  [97.2 F (36.2 C)-98.2 F (36.8 C)] 97.6 F (36.4 C) (10/18 0900) Pulse Rate:  [33-74] 58 (10/18 1100) Resp:  [13-31] 24 (10/18 1100) BP: (91-118)/(63-80) 99/70 (10/18 1100) SpO2:  [83 %-99 %] 98 % (10/18 1100) Weight:  [75.8 kg] 75.8 kg (10/18 0500)  Weight change: 7.76 kg Filed Weights   09/10/23 2300 09/11/23 0500 09/12/23 0500  Weight: 71.8 kg 71.8 kg 75.8 kg    Intake/Output:    Intake/Output Summary (Last 24 hours) at 09/12/2023 1108 Last data filed at 09/12/2023 1000 Gross per 24 hour  Intake 630 ml  Output 520 ml  Net 110 ml     Physical Exam: General: Ill-appearing, laying in the bed  HEENT Multiple bruises from facial fractures  Pulm/lungs Coarse breath sounds, normal breathing effort  CVS/Heart Irregular rhythm  Abdomen:  Soft, nontender, nondistended  Extremities: Trace edema  Neurologic: Alert, able to follow commands  Skin: Multiple scattered ecchymosis          Basic Metabolic Panel:  Recent Labs  Lab 09/10/23 1606 09/10/23 1801 09/11/23 0006 09/11/23 0330 09/11/23 1035 09/11/23 1754 09/12/23 0500  NA 135  --   --  132* 134* 132* 131*  K 6.8*    < > 6.2* 6.0* 5.5* 5.0 4.4  CL 95*  --   --  95* 94* 90* 90*  CO2 25  --   --  24 27 26 26   GLUCOSE 173*  --   --  90 124* 174* 142*  BUN 93*  --   --  99* 95* 96* 104*  CREATININE 5.46*  --   --  4.95* 5.17* 5.27* 5.17*  CALCIUM 8.9  --   --  8.7* 9.3 9.1 8.8*  MG 2.8*  --   --  2.3  --   --  2.3  PHOS 6.5*  --   --  5.9*  --   --  7.0*   < > = values in this interval not displayed.     CBC: Recent Labs  Lab 09/10/23 1606 09/11/23 0330 09/12/23 0500  WBC 13.9* 9.7 9.9  NEUTROABS 12.0*  --   --   HGB 12.3* 11.0* 11.1*  HCT 40.2 35.3* 35.0*  MCV 101.0* 98.9 97.0  PLT 133* 129* 152     No results found for: "HEPBSAG", "HEPBSAB", "HEPBIGM"    Microbiology:  Recent Results (from the past 240 hour(s))  Blood culture (routine single)     Status: None (Preliminary result)   Collection Time: 09/10/23  4:06 PM   Specimen: BLOOD  Result Value Ref Range Status   Specimen Description   Final    BLOOD  LEFT ANTECUBITAL Performed at [facility], [address] [place], [place] [address]    Special Requests   Final    BOTTLES DRAWN AEROBIC AND ANAEROBIC Blood Culture adequate volume Performed at [facility], [address] [place], [place] [address]    Culture   Final    NO GROWTH 2 DAYS Performed at [facility], [address] [place], [place] [address]    Report Status PENDING  Incomplete  Culture, blood (single)     Status: None (Preliminary result)   Collection Time: 09/10/23  5:02 PM   Specimen: BLOOD  Result Value Ref Range Status   Specimen Description BLOOD BLOOD RIGHT FOREARM  Final   Special Requests   Final    BOTTLES DRAWN AEROBIC AND ANAEROBIC Blood Culture results may not be optimal due to an inadequate volume of blood received in culture bottles   Culture   Final    NO GROWTH 2 DAYS Performed at [facility], [address] [place], [place] [address]    Report Status PENDING  Incomplete  SARS Coronavirus 2 by RT  PCR (hospital order, performed in [facility] hospital lab) *cepheid single result test* Anterior Nasal Swab     Status: None   Collection Time: 09/10/23  9:38 PM   Specimen: Anterior Nasal Swab  Result Value Ref Range Status   SARS Coronavirus 2 by RT PCR NEGATIVE NEGATIVE Final    Comment: (NOTE) SARS-CoV-2 target nucleic acids are NOT DETECTED.  The SARS-CoV-2 RNA is generally detectable in upper and lower respiratory specimens during the acute phase of infection. The lowest concentration of SARS-CoV-2 viral copies this assay can detect is 250 copies / mL. A negative result does not preclude SARS-CoV-2 infection and should not be used as the sole basis for treatment or other patient management decisions.  A negative result may occur with improper specimen collection / handling, submission of specimen other than nasopharyngeal swab, presence of viral mutation(s) within the areas targeted by this assay, and inadequate number of viral copies (<250 copies / mL). A negative result must be combined with clinical observations, patient history, and epidemiological information.  Fact Sheet for Patients:   RoadLapTop.co.za  Fact Sheet for Healthcare Providers: http://kim-miller.com/  This test is not yet approved or  cleared by the [place] FDA and has been authorized for detection and/or diagnosis of SARS-CoV-2 by FDA under an Emergency Use Authorization (EUA).  This EUA will remain in effect (meaning this test can be used) for the duration of the COVID-19 declaration under Section 564(b)(1) of the Act, 21 U.S.C. section 360bbb-3(b)(1), unless the authorization is terminated or revoked sooner.  Performed at [facility], [address] [place], [place] [address]   Respiratory (~20 pathogens) panel by PCR     Status: None   Collection Time: 09/10/23  9:38 PM   Specimen: Nasopharyngeal Swab; Respiratory  Result Value Ref  Range Status   Adenovirus NOT DETECTED NOT DETECTED Final   Coronavirus 229E NOT DETECTED NOT DETECTED Final    Comment: (NOTE) The Coronavirus on the Respiratory Panel, DOES NOT test for the novel  Coronavirus (2019 nCoV)    Coronavirus HKU1 NOT DETECTED NOT DETECTED Final   Coronavirus NL63 NOT DETECTED NOT DETECTED Final   Coronavirus OC43 NOT DETECTED NOT DETECTED Final   Metapneumovirus NOT DETECTED NOT DETECTED Final   Rhinovirus / Enterovirus NOT DETECTED NOT DETECTED Final   Influenza A NOT DETECTED NOT DETECTED Final  Influenza B NOT DETECTED NOT DETECTED Final   Parainfluenza Virus 1 NOT DETECTED NOT DETECTED Final   Parainfluenza Virus 2 NOT DETECTED NOT DETECTED Final   Parainfluenza Virus 3 NOT DETECTED NOT DETECTED Final   Parainfluenza Virus 4 NOT DETECTED NOT DETECTED Final   Respiratory Syncytial Virus NOT DETECTED NOT DETECTED Final   Bordetella pertussis NOT DETECTED NOT DETECTED Final   Bordetella Parapertussis NOT DETECTED NOT DETECTED Final   Chlamydophila pneumoniae NOT DETECTED NOT DETECTED Final   Mycoplasma pneumoniae NOT DETECTED NOT DETECTED Final    Comment: Performed at [facility], [address], [place], [place] [address]  MRSA Next Gen by PCR, Nasal     Status: Abnormal   Collection Time: 09/10/23 11:04 PM   Specimen: Nasal Mucosa; Nasal Swab  Result Value Ref Range Status   MRSA by PCR Next Gen DETECTED (A) NOT DETECTED Final    Comment: RESULT CALLED TO, READ BACK BY AND VERIFIED WITH: MARIA CONZALES@[id number]  09/11/23 RH (NOTE) The GeneXpert MRSA Assay (FDA approved for NASAL specimens only), is one component of a comprehensive MRSA colonization surveillance program. It is not intended to diagnose MRSA infection nor to guide or monitor treatment for MRSA infections. Test performance is not FDA approved in patients less than 43 years old. Performed at [facility], [address] [place], [place] [address]     Coagulation  Studies: Recent Labs    09/10/23 1606 09/11/23 0330 09/12/23 0500  LABPROT 31.4* 31.6* 25.2*  INR 3.0* 3.0* 2.3*    Urinalysis: Recent Labs    09/11/23 0409  COLORURINE YELLOW*  LABSPEC 1.015  PHURINE 5.0  GLUCOSEU 50*  HGBUR MODERATE*  BILIRUBINUR NEGATIVE  KETONESUR NEGATIVE  PROTEINUR NEGATIVE  NITRITE NEGATIVE  LEUKOCYTESUR NEGATIVE      Imaging: US RENAL  Result Date: 09/11/2023 CLINICAL DATA:  Acute on chronic renal failure EXAM: RENAL / URINARY TRACT ULTRASOUND COMPLETE COMPARISON:  07/29/2023.  CT 09/10/2023 FINDINGS: Right Kidney: Renal measurements: 10.3 x 6.2 x 5.8 cm = volume: 192 mL. Echogenicity within normal limits. No mass or hydronephrosis visualized. Left Kidney: Renal measurements: 7.0 x 3.5 x 2.8 cm = volume: 37 mL. Atrophic with cortical thinning. No mass or hydronephrosis. Bladder: Decompressed with Foley catheter in place. Other: Moderate  ascites in the abdomen and pelvis. IMPRESSION: No acute findings. Atrophic left kidney, stable. Moderate ascites. Electronically Signed   By: Charlett Nose M.D.   On: 09/11/2023 19:18   ECHOCARDIOGRAM COMPLETE  Result Date: 09/11/2023    ECHOCARDIOGRAM REPORT   Patient Name:   Taylor [name] Date of Exam: 09/11/2023 Medical Rec #:  [id number]    Height:       70.0 in Accession #:    [id number]   Weight:       158.3 lb Date of Birth:  Dec 22, 1940   BSA:          1.890 m Patient Age:    81 years     BP:           103/69 mmHg Patient Gender: M            HR:           62 bpm. Exam Location:  [facility] Procedure: 2D Echo, 3D Echo, Cardiac Doppler and Color Doppler Indications:     Syncope  History:         Patient has prior history of Echocardiogram examinations, most  recent 10/30/2022. CHF and Cardiomyopathy, CAD and Previous                  Myocardial Infarction, Pacemaker and Defibrillator, Stroke,                  Arrythmias:Atrial Fibrillation and Bradycardia,                  Signs/Symptoms:Syncope and Dyspnea;  Risk Factors:Hypertension                  and Diabetes. AAA, CKD.  Sonographer:     Mikki Harbor Referring Phys:  [id number] BRITTON L RUST-CHESTER Diagnosing Phys: Rozell Searing Custovic IMPRESSIONS  1. Left ventricular ejection fraction, by estimation, is <20%. Left ventricular ejection fraction by 2D MOD biplane is 23.9 %. Left ventricular ejection fraction by PLAX is 20 %. The left ventricle has severely decreased function. The left ventricle demonstrates global hypokinesis. The left ventricular internal cavity size was moderately to severely dilated. There is mild left ventricular hypertrophy. Left ventricular diastolic parameters are consistent with Grade III diastolic dysfunction (restrictive).  2. Severe RV pressure and volume overload. RVSP falsely low due to equalization of pressures. Right ventricular systolic function is severely reduced. The right ventricular size is severely enlarged. There is normal pulmonary artery systolic pressure. The estimated right ventricular systolic pressure is 34.0 mmHg.  3. Left atrial size was severely dilated.  4. Right atrial size was severely dilated.  5. The mitral valve is degenerative. Moderate to severe mitral valve regurgitation.  6. The tricuspid valve is degenerative. Tricuspid valve regurgitation is severe.  7. The aortic valve is grossly normal. Aortic valve regurgitation is mild. Aortic valve sclerosis is present, with no evidence of aortic valve stenosis. FINDINGS  Left Ventricle: Left ventricular ejection fraction, by estimation, is <20%. Left ventricular ejection fraction by PLAX is 20 %. Left ventricular ejection fraction by 2D MOD biplane is 23.9 %. The left ventricle has severely decreased function. The left ventricle demonstrates global hypokinesis. The left ventricular internal cavity size was moderately to severely dilated. There is mild left ventricular hypertrophy. Left ventricular diastolic parameters are consistent with Grade III diastolic dysfunction   (restrictive). Right Ventricle: Severe RV pressure and volume overload. RVSP falsely low due to equalization of pressures. The right ventricular size is severely enlarged. No increase in right ventricular wall thickness. Right ventricular systolic function is severely reduced. There is normal pulmonary artery systolic pressure. The tricuspid regurgitant velocity is 2.18 m/s, and with an assumed right atrial pressure of 15 mmHg, the estimated right ventricular systolic pressure is 34.0 mmHg. Left Atrium: Left atrial size was severely dilated. Right Atrium: Right atrial size was severely dilated. Pericardium: There is no evidence of pericardial effusion. Mitral Valve: The mitral valve is degenerative in appearance. Moderate to severe mitral valve regurgitation. MV peak gradient, 2.3 mmHg. The mean mitral valve gradient is 1.0 mmHg. Tricuspid Valve: The tricuspid valve is degenerative in appearance. Tricuspid valve regurgitation is severe. Aortic Valve: The aortic valve is grossly normal. Aortic valve regurgitation is mild. Aortic valve sclerosis is present, with no evidence of aortic valve stenosis. Aortic valve mean gradient measures 6.0 mmHg. Aortic valve peak gradient measures 12.4 mmHg. Aortic valve area, by VTI measures 1.23 cm. Pulmonic Valve: The pulmonic valve was grossly normal. Pulmonic valve regurgitation is trivial. Aorta: The aortic root is normal in size and structure. IAS/Shunts: No atrial level shunt detected by color flow Doppler. Additional Comments: A device lead is visualized.  LEFT VENTRICLE PLAX 2D                        Biplane EF (MOD) LV EF:         Left            LV Biplane EF:   Left                ventricular                      ventricular                ejection                         ejection                fraction by                      fraction by                PLAX is 20                       2D MOD                %.                               biplane is LVIDd:         5.50  cm                          23.9 %. LVIDs:         5.00 cm LV PW:         1.30 cm         Diastology LV IVS:        1.30 cm         LV e' medial:    4.43 cm/s LVOT diam:     2.10 cm         LV E/e' medial:  17.2 LV SV:         42              LV e' lateral:   10.40 cm/s LV SV Index:   22              LV E/e' lateral: 7.3 LVOT Area:     3.46 cm  LV Volumes (MOD) LV vol d, MOD    127.0 ml A2C: LV vol d, MOD    94.1 ml A4C: LV vol s, MOD    83.4 ml A2C: LV vol s, MOD    77.4 ml A4C: LV SV MOD A2C:   43.6 ml LV SV MOD A4C:   94.1 ml LV SV MOD BP:    27.1 ml RIGHT VENTRICLE RV Basal diam:  5.00 cm RV Mid diam:    3.90 cm RV S prime:     6.45 cm/s LEFT ATRIUM             Index        RIGHT ATRIUM           Index LA diam:        5.20 cm 2.75 cm/m   RA Area:     37.30 cm  LA Vol (A2C):   68.5 ml 36.25 ml/m  RA Volume:   151.00 ml 79.90 ml/m LA Vol (A4C):   89.8 ml 47.52 ml/m LA Biplane Vol: 78.7 ml 41.64 ml/m  AORTIC VALVE                     PULMONIC VALVE AV Area (Vmax):    1.20 cm      PV Vmax:       0.58 m/s AV Area (Vmean):   1.23 cm      PV Peak grad:  1.3 mmHg AV Area (VTI):     1.23 cm AV Vmax:           176.00 cm/s AV Vmean:          115.500 cm/s AV VTI:            0.337 m AV Peak Grad:      12.4 mmHg AV Mean Grad:      6.0 mmHg LVOT Vmax:         60.80 cm/s LVOT Vmean:        41.100 cm/s LVOT VTI:          0.120 m LVOT/AV VTI ratio: 0.36  AORTA Ao Root diam: 4.00 cm Ao Asc diam:  4.10 cm MITRAL VALVE               TRICUSPID VALVE MV Area (PHT): 3.00 cm    TR Peak grad:   19.0 mmHg MV Area VTI:   1.74 cm    TR Vmax:        218.00 cm/s MV Peak grad:  2.3 mmHg MV Mean grad:  1.0 mmHg    SHUNTS MV Vmax:       0.76 m/s    Systemic VTI:  0.12 m MV Vmean:      41.8 cm/s   Systemic Diam: 2.10 cm MV Decel Time: 253 msec MV E velocity: 76.20 cm/s MV A velocity: 31.40 cm/s MV E/A ratio:  2.43 Designer, multimedia signed by Clotilde Dieter Signature Date/Time: 09/11/2023/10:20:52 AM    Final    CT CHEST  ABDOMEN PELVIS WO CONTRAST  Result Date: 09/10/2023 CLINICAL DATA:  Sepsis, fall. EXAM: CT CHEST, ABDOMEN AND PELVIS WITHOUT CONTRAST TECHNIQUE: Multidetector CT imaging of the chest, abdomen and pelvis was performed following the standard protocol without IV contrast. RADIATION DOSE REDUCTION: This exam was performed according to the departmental dose-optimization program which includes automated exposure control, adjustment of the mA and/or kV according to patient size and/or use of iterative reconstruction technique. COMPARISON:  CT chest abdomen and pelvis 08/29/2023 FINDINGS: CT CHEST FINDINGS Cardiovascular: The heart is moderately enlarged, unchanged. There is no pericardial effusion. Left-sided pacemaker is present. Aorta is normal in size. There are atherosclerotic calcifications of the aorta. Mediastinum/Nodes: No enlarged mediastinal, hilar, or axillary lymph nodes. Thyroid gland, trachea, and esophagus demonstrate no significant findings. There are calcified left hilar lymph nodes. Lungs/Pleura: There are calcified pleural plaques bilaterally. Small to moderate-sized right pleural effusion is present. There is new patchy airspace disease in the right middle lobe and left lower lobe with additional ground-glass opacities in the left lower lobe compatible with infection. There is a calcified granuloma in the left lower lobe. Musculoskeletal: Sternotomy wires are present. There are healed left-sided rib fractures. CT ABDOMEN PELVIS FINDINGS Hepatobiliary: Nodular liver contour is again seen. Gallbladder and bile ducts are within normal limits. Pancreas: Unremarkable. No pancreatic ductal dilatation or surrounding inflammatory changes. Spleen:  Normal in size without focal abnormality. Calcified granulomas are present. Adrenals/Urinary Tract: Subcentimeter hyperdense lesion in the superior pole the right kidney is unchanged. Right kidney is otherwise within normal limits. There is left renal atrophy,  unchanged. The adrenal glands and bladder are within normal limits. Stomach/Bowel: Stomach is within normal limits. No evidence of bowel wall thickening, distention, or inflammatory changes. The appendix is not seen. There is colonic diverticulosis. Duodenal diverticulum is present. Vascular/Lymphatic: There is aneurysmal dilatation of the aorta at the level of the renal arteries measuring 3.3 cm, unchanged. Patient is status post aorto bi-iliac graft. There severe atherosclerotic calcifications throughout the aorta. IVC is normal in size. No enlarged lymph nodes are identified. Reproductive: Prostate is unremarkable. Other: There is a small right inguinal hernia containing free fluid. There is small amount of ascites throughout the abdomen and pelvis. Musculoskeletal: Degenerative changes affect the spine. IMPRESSION: 1. New patchy airspace disease in the right middle lobe and left lower lobe compatible with infection. 2. Small to moderate-sized right pleural effusion. 3. Stable moderate cardiomegaly. 4. Stable nodular liver contour compatible with cirrhosis. 5. Small amount of ascites. 6. Stable 3.3 cm abdominal aortic aneurysm. Recommend follow-up every 3 years. 7. Aortic atherosclerosis. Aortic Atherosclerosis (ICD10-I70.0). Electronically Signed   By: Darliss Cheney M.D.   On: 09/10/2023 21:04   CT Head Wo Contrast  Result Date: 09/10/2023 CLINICAL DATA:  Head trauma, minor (Age >= 65y); Facial trauma, blunt; Neck trauma (Age >= 65y) EXAM: CT HEAD WITHOUT CONTRAST CT MAXILLOFACIAL WITHOUT CONTRAST CT CERVICAL SPINE WITHOUT CONTRAST TECHNIQUE: Multidetector CT imaging of the head, cervical spine, and maxillofacial structures were performed using the standard protocol without intravenous contrast. Multiplanar CT image reconstructions of the cervical spine and maxillofacial structures were also generated. RADIATION DOSE REDUCTION: This exam was performed according to the departmental dose-optimization program  which includes automated exposure control, adjustment of the mA and/or kV according to patient size and/or use of iterative reconstruction technique. COMPARISON:  CT head, CT cervical spine and CT of the face from October 4, 24. FINDINGS: CT HEAD FINDINGS Brain: Remote left PCA territory infarct. No evidence of acute large vascular territory infarct, acute hemorrhage, mass lesion, midline shift or hydrocephalus. Vascular: No hyperdense vessel. Skull: Similar chronic deformity of the left calvarium. No acute fracture. Other: No mastoid effusions CT MAXILLOFACIAL FINDINGS Osseous: Similar nasal bone fractures. No evidence of interval fracture. TMJs are located. Orbits: Negative. No traumatic or inflammatory finding. Sinuses: Extensive paranasal sinus disease, slightly improved compared to the recent prior. Soft tissues: Decreased size of a left periorbital/forehead contusion compared to recent prior. CT CERVICAL SPINE FINDINGS Alignment: No substantial sagittal subluxation. Skull base and vertebrae: No evidence of acute fracture. Soft tissues and spinal canal: No prevertebral fluid or swelling. No visible canal hematoma. Disc levels: Similar moderate to severe multilevel degenerative change including facet and uncovertebral hypertrophy with varying degrees of neural foraminal stenosis. Upper chest: Please see same day CT of the chest/abdomen/pelvis for further evaluation. Other: Vascular stent in the left neck. IMPRESSION: 1. No acute intracranial abnormality. 2. Similar recent nasal bone fractures. No evidence of interval facial fracture. 3. No evidence of acute fracture or traumatic malalignment in the cervical spine. Electronically Signed   By: Feliberto Harts M.D.   On: 09/10/2023 19:11   CT Maxillofacial Wo Contrast  Result Date: 09/10/2023 CLINICAL DATA:  Head trauma, minor (Age >= 65y); Facial trauma, blunt; Neck trauma (Age >= 65y) EXAM: CT HEAD WITHOUT CONTRAST CT MAXILLOFACIAL WITHOUT CONTRAST  CT  CERVICAL SPINE WITHOUT CONTRAST TECHNIQUE: Multidetector CT imaging of the head, cervical spine, and maxillofacial structures were performed using the standard protocol without intravenous contrast. Multiplanar CT image reconstructions of the cervical spine and maxillofacial structures were also generated. RADIATION DOSE REDUCTION: This exam was performed according to the departmental dose-optimization program which includes automated exposure control, adjustment of the mA and/or kV according to patient size and/or use of iterative reconstruction technique. COMPARISON:  CT head, CT cervical spine and CT of the face from October 4, 24. FINDINGS: CT HEAD FINDINGS Brain: Remote left PCA territory infarct. No evidence of acute large vascular territory infarct, acute hemorrhage, mass lesion, midline shift or hydrocephalus. Vascular: No hyperdense vessel. Skull: Similar chronic deformity of the left calvarium. No acute fracture. Other: No mastoid effusions CT MAXILLOFACIAL FINDINGS Osseous: Similar nasal bone fractures. No evidence of interval fracture. TMJs are located. Orbits: Negative. No traumatic or inflammatory finding. Sinuses: Extensive paranasal sinus disease, slightly improved compared to the recent prior. Soft tissues: Decreased size of a left periorbital/forehead contusion compared to recent prior. CT CERVICAL SPINE FINDINGS Alignment: No substantial sagittal subluxation. Skull base and vertebrae: No evidence of acute fracture. Soft tissues and spinal canal: No prevertebral fluid or swelling. No visible canal hematoma. Disc levels: Similar moderate to severe multilevel degenerative change including facet and uncovertebral hypertrophy with varying degrees of neural foraminal stenosis. Upper chest: Please see same day CT of the chest/abdomen/pelvis for further evaluation. Other: Vascular stent in the left neck. IMPRESSION: 1. No acute intracranial abnormality. 2. Similar recent nasal bone fractures. No evidence  of interval facial fracture. 3. No evidence of acute fracture or traumatic malalignment in the cervical spine. Electronically Signed   By: Feliberto Harts M.D.   On: 09/10/2023 19:11   CT Cervical Spine Wo Contrast  Result Date: 09/10/2023 CLINICAL DATA:  Head trauma, minor (Age >= 65y); Facial trauma, blunt; Neck trauma (Age >= 65y) EXAM: CT HEAD WITHOUT CONTRAST CT MAXILLOFACIAL WITHOUT CONTRAST CT CERVICAL SPINE WITHOUT CONTRAST TECHNIQUE: Multidetector CT imaging of the head, cervical spine, and maxillofacial structures were performed using the standard protocol without intravenous contrast. Multiplanar CT image reconstructions of the cervical spine and maxillofacial structures were also generated. RADIATION DOSE REDUCTION: This exam was performed according to the departmental dose-optimization program which includes automated exposure control, adjustment of the mA and/or kV according to patient size and/or use of iterative reconstruction technique. COMPARISON:  CT head, CT cervical spine and CT of the face from October 4, 24. FINDINGS: CT HEAD FINDINGS Brain: Remote left PCA territory infarct. No evidence of acute large vascular territory infarct, acute hemorrhage, mass lesion, midline shift or hydrocephalus. Vascular: No hyperdense vessel. Skull: Similar chronic deformity of the left calvarium. No acute fracture. Other: No mastoid effusions CT MAXILLOFACIAL FINDINGS Osseous: Similar nasal bone fractures. No evidence of interval fracture. TMJs are located. Orbits: Negative. No traumatic or inflammatory finding. Sinuses: Extensive paranasal sinus disease, slightly improved compared to the recent prior. Soft tissues: Decreased size of a left periorbital/forehead contusion compared to recent prior. CT CERVICAL SPINE FINDINGS Alignment: No substantial sagittal subluxation. Skull base and vertebrae: No evidence of acute fracture. Soft tissues and spinal canal: No prevertebral fluid or swelling. No visible  canal hematoma. Disc levels: Similar moderate to severe multilevel degenerative change including facet and uncovertebral hypertrophy with varying degrees of neural foraminal stenosis. Upper chest: Please see same day CT of the chest/abdomen/pelvis for further evaluation. Other: Vascular stent in the left neck. IMPRESSION: 1. No  acute intracranial abnormality. 2. Similar recent nasal bone fractures. No evidence of interval facial fracture. 3. No evidence of acute fracture or traumatic malalignment in the cervical spine. Electronically Signed   By: Feliberto Harts M.D.   On: 09/10/2023 19:11   DG Elbow 2 Views Right  Result Date: 09/10/2023 CLINICAL DATA:  Pain after fall. EXAM: RIGHT ELBOW - 2 VIEW COMPARISON:  None Available. FINDINGS: No acute fracture or dislocation. No definite joint effusion. Corticated density posterior to the olecranon is chronic. Mild degenerative spurring. Mild posterior soft tissue edema. IMPRESSION: 1. No acute fracture or dislocation of the right elbow. 2. Mild posterior soft tissue edema. Electronically Signed   By: Narda Rutherford M.D.   On: 09/10/2023 18:29   DG Chest Port 1 View  Result Date: 09/10/2023 CLINICAL DATA:  Fall today.  Weakness. EXAM: PORTABLE CHEST 1 VIEW COMPARISON:  Radiograph and CT 08/29/2023 FINDINGS: Unchanged cardiomegaly post CABG. Left-sided pacemaker in place. Improvement in right pleural effusion from prior exam. Interstitial thickening is similar. No pneumothorax or acute airspace disease. Remote right clavicle fracture again seen. IMPRESSION: 1. No acute findings. 2. Unchanged cardiomegaly post CABG. Improvement in right pleural effusion from prior exam. Electronically Signed   By: Narda Rutherford M.D.   On: 09/10/2023 18:28   DG Elbow 2 Views Left  Result Date: 09/10/2023 CLINICAL DATA:  Fall.  Pain. EXAM: LEFT ELBOW - 2 VIEW COMPARISON:  None Available. FINDINGS: No fracture or dislocation. No definite joint effusion. Skin folds project  over the medial humeral epicondyle. Mild degenerative spurring. IV in the antecubital fossa. IMPRESSION: No fracture or dislocation of the left elbow. Mild degenerative spurring. Electronically Signed   By: Narda Rutherford M.D.   On: 09/10/2023 18:27     Medications:      aspirin EC  81 mg Oral Daily   Chlorhexidine Gluconate Cloth  6 each Topical QHS   feeding supplement (NEPRO CARB STEADY)  237 mL Oral TID BM   heparin injection (subcutaneous)  5,000 Units Subcutaneous Q8H   insulin aspart  0-5 Units Subcutaneous QHS   insulin aspart  0-9 Units Subcutaneous TID WC   lactulose  10 g Oral BID   midodrine  5 mg Oral TID WC   multivitamin with minerals  1 tablet Oral Daily   mupirocin ointment  1 Application Nasal BID   pantoprazole  40 mg Oral Daily   sodium chloride flush  10 mL Intravenous Q12H   triamcinolone ointment   Topical BID   acetaminophen, dextromethorphan-guaiFENesin, ipratropium-albuterol, ondansetron (ZOFRAN) IV  Assessment/ Plan:  82 y.o. male with sCHF with EF < 20%, CKD-4, HTN, HLD, DM, COPD on 2L O2, CAD with stent and hx of CABG, stroke, A fib on Eliquis, s/p of AICD, melanoma, psoriatic arthritis, pancreatitis, liver cirrhosis with ascites (noted on recent CT scan on 08/29/23) admitted on 09/10/2023 for Hyperkalemia [E87.5] SIRS (systemic inflammatory response syndrome) (HCC) [R65.10] AKI (acute kidney injury) (HCC) [N17.9] Injury of head, initial encounter [S09.90XA] Fall, initial encounter [W19.XXXA] Acute kidney injury superimposed on stage 4 chronic kidney disease (HCC) [N17.9, N18.4]  Acute kidney injury on chronic kidney disease stage IV Patient followed by our practice for chronic kidney disease. Most recent outpatient labs-creatinine 2.65/GFR 24 from 07/01/2023. AKI likely secondary to volume depletion and concurrent illness. Creatinine stable today, decreased urine output remains. Denies uremic symptoms. No acute indication for dialysis.    Hyperkalemia Potassium level of 6.8 at presentation. Corrected to 4.4 Agree with holding potassium and  spironolactone. Continue conservative measures.    Pneumonia/sepsis/lactic acidosis Hypotensive, requiring pressors, midodrine. Antibiotics-vancomycin, levofloxacin stopped Continue supportive care.    LOS: 2 Taylor [name] 10/18/202411:08 AM  [address] [place], [place] [phone]

## 2023-09-12 NOTE — Plan of Care (Signed)
[facility] Tonsillectomy/Adenoidectomy, Postoperative PEDS care plan entered in error.

## 2023-09-12 NOTE — Progress Notes (Signed)
PROGRESS NOTE    LOGEN KYNE   ZOX:[id number] DOB: 08-27-1941  DOA: 09/10/2023 Date of Service: 09/12/23 which is hospital day 2  PCP: Malva Limes, MD    HPI: Taylor [name] is a 82 y.o. male past medical history significant for HFrEF (< 20%), CKD stage IV, hypertension, hyperlipidemia, diabetes, COPD on chronic home 2 L, CAD, history of atrial fibrillation on Eliquis, who presents to ED from home via EMS for fall x3. Multiple falls past month. Concern for low BP. No LOC.   Pertinent recent tx/imaging: 10/04: visit ED due to fall - (+)minimally displaced and comminuted radial styloid fracture --> splint to left forearm. (+)fx nasal bones and L maxilla.   10/10: ENT visit, discuss repair options for facial fx, not interested, conservative mgt.   Hospital course / significant events:  10/16: to ED. (+)sepsis, severe - hypothermic Tmin 40F, low BP 80s/60s, AKI, hyperkalemic K max 6.8 improved w/ tx, Cr on admission 5.46 above baseline 2.7, lactic max 3.8. CT (+)PNA RML and LLL, mod R pleural effusion also noted on previous CT. Tx midodrine, steroids, fluids, vanc/cefepime.  10/17: BP improved, renal fxn and K improved, Echo LVEF <20%, severely decreased fxn and global hypokinesis, grade III diastolic dysfunction, severe RV overload, severe RA and LA dilation. Renal US pending read. Palliative care consult, see note. Cardiology consult 10/18: stable off pressors, transfer to progressive unit. Continue to monitor renal/cardiac function closely.   Consultants:  Nephrology PCCU Palliative care  Cardiology, advanced HF team   Procedures/Surgeries: none      ASSESSMENT & PLAN:    INFECTIOUS   Severe sepsis due to HCAP on CT, RML, LLL. POA (+)MRSA screen, pending cultures  Neg COVID and resp viral PCR  meets criteria for severe sepsis with WBC 13.9, hypothermia with temperature 92.1.  Lactic acid is elevated at 3.8 --> 2.9. Patient has been persistently hypotensive. admit to  SDU as inp Vancomycin and cefepime initiated 10/16 and patient received 1 dose of Levaquin in ED. MRSA screen (+), continue vancomycin  Blood and sputum cultures, urine antigen of Legionella and strep IV fluid: total of 1.75 L of NS, then 150 mEq of sodium bicarbonate at 75 cc/h - caution w/ Hx CHF Midodrine Phenylephrine d/c this morning - monitor and low threshold to restart pressors  Hypothermia - resolved Bair hugger Tx sepsis as above     RENAL  Acute renal failure superimposed on stage 4 chronic kidney disease Hyperkalemia and hyperphosphatemia likely as result of AKI  creatinine 5.46, BUN 93 and GFR 10 (recent baseline creatinine 2.82 and GFR 22 on 10//24) --> Cr 5.17 today 09/12/23  Ddx include UTI, dehydration and continuation of diuretics.  ATN is possible due to hypotension. Question hepatorenal syndrome.   Nephrology and cardiology following  holding potassium and spironolactone Holding diuretics bicarb infusion. Hold nephrotoxic rx  Follow UA --> no UTI, (+)hyaline casts, (+)micro hematuria Renal US --> pending read   Hyperkalemia:  Potassium 6.8 --> 4.4. Likely d/t AKI/ATN pt was treated with 1 g calcium gluconate, 5 units of NovoLog, 50 cc of D50, 50 mEq of sodium bicarbonate in ED S/p Patiromer 16.8 g  Nephrology following  consulted pharmacist for electrolytes monitoring  Hyponatremia likely d/t AKI Monitor BMP Treat underlying causes    CARDIAC  Chronic combined systolic and diastolic CHF (congestive heart failure)  2D echo on 10/30/2022 showed EF<20%.   Patient has elevated BNP 647 which is below his baseline, no leg edema or  PROGRESS NOTE    LOGEN KYNE   ZOX:[id number] DOB: 08-27-1941  DOA: 09/10/2023 Date of Service: 09/12/23 which is hospital day 2  PCP: Malva Limes, MD    HPI: Taylor [name] is a 82 y.o. male past medical history significant for HFrEF (< 20%), CKD stage IV, hypertension, hyperlipidemia, diabetes, COPD on chronic home 2 L, CAD, history of atrial fibrillation on Eliquis, who presents to ED from home via EMS for fall x3. Multiple falls past month. Concern for low BP. No LOC.   Pertinent recent tx/imaging: 10/04: visit ED due to fall - (+)minimally displaced and comminuted radial styloid fracture --> splint to left forearm. (+)fx nasal bones and L maxilla.   10/10: ENT visit, discuss repair options for facial fx, not interested, conservative mgt.   Hospital course / significant events:  10/16: to ED. (+)sepsis, severe - hypothermic Tmin 40F, low BP 80s/60s, AKI, hyperkalemic K max 6.8 improved w/ tx, Cr on admission 5.46 above baseline 2.7, lactic max 3.8. CT (+)PNA RML and LLL, mod R pleural effusion also noted on previous CT. Tx midodrine, steroids, fluids, vanc/cefepime.  10/17: BP improved, renal fxn and K improved, Echo LVEF <20%, severely decreased fxn and global hypokinesis, grade III diastolic dysfunction, severe RV overload, severe RA and LA dilation. Renal US pending read. Palliative care consult, see note. Cardiology consult 10/18: stable off pressors, transfer to progressive unit. Continue to monitor renal/cardiac function closely.   Consultants:  Nephrology PCCU Palliative care  Cardiology, advanced HF team   Procedures/Surgeries: none      ASSESSMENT & PLAN:    INFECTIOUS   Severe sepsis due to HCAP on CT, RML, LLL. POA (+)MRSA screen, pending cultures  Neg COVID and resp viral PCR  meets criteria for severe sepsis with WBC 13.9, hypothermia with temperature 92.1.  Lactic acid is elevated at 3.8 --> 2.9. Patient has been persistently hypotensive. admit to  SDU as inp Vancomycin and cefepime initiated 10/16 and patient received 1 dose of Levaquin in ED. MRSA screen (+), continue vancomycin  Blood and sputum cultures, urine antigen of Legionella and strep IV fluid: total of 1.75 L of NS, then 150 mEq of sodium bicarbonate at 75 cc/h - caution w/ Hx CHF Midodrine Phenylephrine d/c this morning - monitor and low threshold to restart pressors  Hypothermia - resolved Bair hugger Tx sepsis as above     RENAL  Acute renal failure superimposed on stage 4 chronic kidney disease Hyperkalemia and hyperphosphatemia likely as result of AKI  creatinine 5.46, BUN 93 and GFR 10 (recent baseline creatinine 2.82 and GFR 22 on 10//24) --> Cr 5.17 today 09/12/23  Ddx include UTI, dehydration and continuation of diuretics.  ATN is possible due to hypotension. Question hepatorenal syndrome.   Nephrology and cardiology following  holding potassium and spironolactone Holding diuretics bicarb infusion. Hold nephrotoxic rx  Follow UA --> no UTI, (+)hyaline casts, (+)micro hematuria Renal US --> pending read   Hyperkalemia:  Potassium 6.8 --> 4.4. Likely d/t AKI/ATN pt was treated with 1 g calcium gluconate, 5 units of NovoLog, 50 cc of D50, 50 mEq of sodium bicarbonate in ED S/p Patiromer 16.8 g  Nephrology following  consulted pharmacist for electrolytes monitoring  Hyponatremia likely d/t AKI Monitor BMP Treat underlying causes    CARDIAC  Chronic combined systolic and diastolic CHF (congestive heart failure)  2D echo on 10/30/2022 showed EF<20%.   Patient has elevated BNP 647 which is below his baseline, no leg edema or  the duration of the COVID-19 declaration under Section 564(b)(1) of the Act, 21 U.S.C. section 360bbb-3(b)(1), unless the authorization is terminated or revoked sooner.  Performed at [facility], [address] [place], [place] [address]   Respiratory (~20 pathogens) panel by PCR     Status: None   Collection Time: 09/10/23  9:38 PM   Specimen: Nasopharyngeal Swab; Respiratory  Result Value Ref Range Status   Adenovirus NOT DETECTED NOT DETECTED Final   Coronavirus 229E NOT DETECTED NOT DETECTED Final    Comment: (NOTE) The Coronavirus on the Respiratory Panel, DOES NOT test for the novel  Coronavirus (2019 nCoV)    Coronavirus HKU1 NOT DETECTED NOT DETECTED Final   Coronavirus NL63 NOT DETECTED NOT DETECTED Final   Coronavirus OC43 NOT DETECTED NOT DETECTED Final   Metapneumovirus NOT DETECTED NOT DETECTED Final   Rhinovirus / Enterovirus NOT DETECTED NOT DETECTED Final   Influenza A NOT DETECTED NOT DETECTED Final   Influenza B NOT DETECTED NOT DETECTED Final   Parainfluenza Virus 1 NOT DETECTED  NOT DETECTED Final   Parainfluenza Virus 2 NOT DETECTED NOT DETECTED Final   Parainfluenza Virus 3 NOT DETECTED NOT DETECTED Final   Parainfluenza Virus 4 NOT DETECTED NOT DETECTED Final   Respiratory Syncytial Virus NOT DETECTED NOT DETECTED Final   Bordetella pertussis NOT DETECTED NOT DETECTED Final   Bordetella Parapertussis NOT DETECTED NOT DETECTED Final   Chlamydophila pneumoniae NOT DETECTED NOT DETECTED Final   Mycoplasma pneumoniae NOT DETECTED NOT DETECTED Final    Comment: Performed at [facility], [address] [place], [place] [address]  MRSA Next Gen by PCR, Nasal     Status: Abnormal   Collection Time: 09/10/23 11:04 PM   Specimen: Nasal Mucosa; Nasal Swab  Result Value Ref Range Status   MRSA by PCR Next Gen DETECTED (A) NOT DETECTED Final    Comment: RESULT CALLED TO, READ BACK BY AND VERIFIED WITH: MARIA CONZALES@[id number]  09/11/23 RH (NOTE) The GeneXpert MRSA Assay (FDA approved for NASAL specimens only), is one component of a comprehensive MRSA colonization surveillance program. It is not intended to diagnose MRSA infection nor to guide or monitor treatment for MRSA infections. Test performance is not FDA approved in patients less than 39 years old. Performed at [facility], [address] [place], [place] [address]       Radiology Studies last 3 days: US RENAL  Result Date: 09/11/2023 CLINICAL DATA:  Acute on chronic renal failure EXAM: RENAL / URINARY TRACT ULTRASOUND COMPLETE COMPARISON:  07/29/2023.  CT 09/10/2023 FINDINGS: Right Kidney: Renal measurements: 10.3 x 6.2 x 5.8 cm = volume: 192 mL. Echogenicity within normal limits. No mass or hydronephrosis visualized. Left Kidney: Renal measurements: 7.0 x 3.5 x 2.8 cm = volume: 37 mL. Atrophic with cortical thinning. No mass or hydronephrosis. Bladder: Decompressed with Foley catheter in place. Other: Moderate  ascites in the abdomen and pelvis. IMPRESSION: No acute findings. Atrophic left  kidney, stable. Moderate ascites. Electronically Signed   By: Charlett Nose M.D.   On: 09/11/2023 19:18   ECHOCARDIOGRAM COMPLETE  Result Date: 09/11/2023    ECHOCARDIOGRAM REPORT   Patient Name:   Taylor [name] Date of Exam: 09/11/2023 Medical Rec #:  [id number]    Height:       70.0 in Accession #:    [id number]   Weight:       158.3 lb Date of Birth:  1941/11/15   BSA:  PROGRESS NOTE    LOGEN KYNE   ZOX:[id number] DOB: 08-27-1941  DOA: 09/10/2023 Date of Service: 09/12/23 which is hospital day 2  PCP: Malva Limes, MD    HPI: Taylor [name] is a 82 y.o. male past medical history significant for HFrEF (< 20%), CKD stage IV, hypertension, hyperlipidemia, diabetes, COPD on chronic home 2 L, CAD, history of atrial fibrillation on Eliquis, who presents to ED from home via EMS for fall x3. Multiple falls past month. Concern for low BP. No LOC.   Pertinent recent tx/imaging: 10/04: visit ED due to fall - (+)minimally displaced and comminuted radial styloid fracture --> splint to left forearm. (+)fx nasal bones and L maxilla.   10/10: ENT visit, discuss repair options for facial fx, not interested, conservative mgt.   Hospital course / significant events:  10/16: to ED. (+)sepsis, severe - hypothermic Tmin 40F, low BP 80s/60s, AKI, hyperkalemic K max 6.8 improved w/ tx, Cr on admission 5.46 above baseline 2.7, lactic max 3.8. CT (+)PNA RML and LLL, mod R pleural effusion also noted on previous CT. Tx midodrine, steroids, fluids, vanc/cefepime.  10/17: BP improved, renal fxn and K improved, Echo LVEF <20%, severely decreased fxn and global hypokinesis, grade III diastolic dysfunction, severe RV overload, severe RA and LA dilation. Renal US pending read. Palliative care consult, see note. Cardiology consult 10/18: stable off pressors, transfer to progressive unit. Continue to monitor renal/cardiac function closely.   Consultants:  Nephrology PCCU Palliative care  Cardiology, advanced HF team   Procedures/Surgeries: none      ASSESSMENT & PLAN:    INFECTIOUS   Severe sepsis due to HCAP on CT, RML, LLL. POA (+)MRSA screen, pending cultures  Neg COVID and resp viral PCR  meets criteria for severe sepsis with WBC 13.9, hypothermia with temperature 92.1.  Lactic acid is elevated at 3.8 --> 2.9. Patient has been persistently hypotensive. admit to  SDU as inp Vancomycin and cefepime initiated 10/16 and patient received 1 dose of Levaquin in ED. MRSA screen (+), continue vancomycin  Blood and sputum cultures, urine antigen of Legionella and strep IV fluid: total of 1.75 L of NS, then 150 mEq of sodium bicarbonate at 75 cc/h - caution w/ Hx CHF Midodrine Phenylephrine d/c this morning - monitor and low threshold to restart pressors  Hypothermia - resolved Bair hugger Tx sepsis as above     RENAL  Acute renal failure superimposed on stage 4 chronic kidney disease Hyperkalemia and hyperphosphatemia likely as result of AKI  creatinine 5.46, BUN 93 and GFR 10 (recent baseline creatinine 2.82 and GFR 22 on 10//24) --> Cr 5.17 today 09/12/23  Ddx include UTI, dehydration and continuation of diuretics.  ATN is possible due to hypotension. Question hepatorenal syndrome.   Nephrology and cardiology following  holding potassium and spironolactone Holding diuretics bicarb infusion. Hold nephrotoxic rx  Follow UA --> no UTI, (+)hyaline casts, (+)micro hematuria Renal US --> pending read   Hyperkalemia:  Potassium 6.8 --> 4.4. Likely d/t AKI/ATN pt was treated with 1 g calcium gluconate, 5 units of NovoLog, 50 cc of D50, 50 mEq of sodium bicarbonate in ED S/p Patiromer 16.8 g  Nephrology following  consulted pharmacist for electrolytes monitoring  Hyponatremia likely d/t AKI Monitor BMP Treat underlying causes    CARDIAC  Chronic combined systolic and diastolic CHF (congestive heart failure)  2D echo on 10/30/2022 showed EF<20%.   Patient has elevated BNP 647 which is below his baseline, no leg edema or  the duration of the COVID-19 declaration under Section 564(b)(1) of the Act, 21 U.S.C. section 360bbb-3(b)(1), unless the authorization is terminated or revoked sooner.  Performed at [facility], [address] [place], [place] [address]   Respiratory (~20 pathogens) panel by PCR     Status: None   Collection Time: 09/10/23  9:38 PM   Specimen: Nasopharyngeal Swab; Respiratory  Result Value Ref Range Status   Adenovirus NOT DETECTED NOT DETECTED Final   Coronavirus 229E NOT DETECTED NOT DETECTED Final    Comment: (NOTE) The Coronavirus on the Respiratory Panel, DOES NOT test for the novel  Coronavirus (2019 nCoV)    Coronavirus HKU1 NOT DETECTED NOT DETECTED Final   Coronavirus NL63 NOT DETECTED NOT DETECTED Final   Coronavirus OC43 NOT DETECTED NOT DETECTED Final   Metapneumovirus NOT DETECTED NOT DETECTED Final   Rhinovirus / Enterovirus NOT DETECTED NOT DETECTED Final   Influenza A NOT DETECTED NOT DETECTED Final   Influenza B NOT DETECTED NOT DETECTED Final   Parainfluenza Virus 1 NOT DETECTED  NOT DETECTED Final   Parainfluenza Virus 2 NOT DETECTED NOT DETECTED Final   Parainfluenza Virus 3 NOT DETECTED NOT DETECTED Final   Parainfluenza Virus 4 NOT DETECTED NOT DETECTED Final   Respiratory Syncytial Virus NOT DETECTED NOT DETECTED Final   Bordetella pertussis NOT DETECTED NOT DETECTED Final   Bordetella Parapertussis NOT DETECTED NOT DETECTED Final   Chlamydophila pneumoniae NOT DETECTED NOT DETECTED Final   Mycoplasma pneumoniae NOT DETECTED NOT DETECTED Final    Comment: Performed at [facility], [address] [place], [place] [address]  MRSA Next Gen by PCR, Nasal     Status: Abnormal   Collection Time: 09/10/23 11:04 PM   Specimen: Nasal Mucosa; Nasal Swab  Result Value Ref Range Status   MRSA by PCR Next Gen DETECTED (A) NOT DETECTED Final    Comment: RESULT CALLED TO, READ BACK BY AND VERIFIED WITH: MARIA CONZALES@[email or web address]  09/11/23 RH (NOTE) The GeneXpert MRSA Assay (FDA approved for NASAL specimens only), is one component of a comprehensive MRSA colonization surveillance program. It is not intended to diagnose MRSA infection nor to guide or monitor treatment for MRSA infections. Test performance is not FDA approved in patients less than 39 years old. Performed at [facility], [address] [place], [place] [address]       Radiology Studies last 3 days: US RENAL  Result Date: 09/11/2023 CLINICAL DATA:  Acute on chronic renal failure EXAM: RENAL / URINARY TRACT ULTRASOUND COMPLETE COMPARISON:  07/29/2023.  CT 09/10/2023 FINDINGS: Right Kidney: Renal measurements: 10.3 x 6.2 x 5.8 cm = volume: 192 mL. Echogenicity within normal limits. No mass or hydronephrosis visualized. Left Kidney: Renal measurements: 7.0 x 3.5 x 2.8 cm = volume: 37 mL. Atrophic with cortical thinning. No mass or hydronephrosis. Bladder: Decompressed with Foley catheter in place. Other: Moderate  ascites in the abdomen and pelvis. IMPRESSION: No acute findings. Atrophic left  kidney, stable. Moderate ascites. Electronically Signed   By: Charlett Nose M.D.   On: 09/11/2023 19:18   ECHOCARDIOGRAM COMPLETE  Result Date: 09/11/2023    ECHOCARDIOGRAM REPORT   Patient Name:   Taylor [name] Date of Exam: 09/11/2023 Medical Rec #:  [id number]    Height:       70.0 in Accession #:    [id number]   Weight:       158.3 lb Date of Birth:  1941/11/15   BSA:  the duration of the COVID-19 declaration under Section 564(b)(1) of the Act, 21 U.S.C. section 360bbb-3(b)(1), unless the authorization is terminated or revoked sooner.  Performed at [facility], [address] [place], [place] [address]   Respiratory (~20 pathogens) panel by PCR     Status: None   Collection Time: 09/10/23  9:38 PM   Specimen: Nasopharyngeal Swab; Respiratory  Result Value Ref Range Status   Adenovirus NOT DETECTED NOT DETECTED Final   Coronavirus 229E NOT DETECTED NOT DETECTED Final    Comment: (NOTE) The Coronavirus on the Respiratory Panel, DOES NOT test for the novel  Coronavirus (2019 nCoV)    Coronavirus HKU1 NOT DETECTED NOT DETECTED Final   Coronavirus NL63 NOT DETECTED NOT DETECTED Final   Coronavirus OC43 NOT DETECTED NOT DETECTED Final   Metapneumovirus NOT DETECTED NOT DETECTED Final   Rhinovirus / Enterovirus NOT DETECTED NOT DETECTED Final   Influenza A NOT DETECTED NOT DETECTED Final   Influenza B NOT DETECTED NOT DETECTED Final   Parainfluenza Virus 1 NOT DETECTED  NOT DETECTED Final   Parainfluenza Virus 2 NOT DETECTED NOT DETECTED Final   Parainfluenza Virus 3 NOT DETECTED NOT DETECTED Final   Parainfluenza Virus 4 NOT DETECTED NOT DETECTED Final   Respiratory Syncytial Virus NOT DETECTED NOT DETECTED Final   Bordetella pertussis NOT DETECTED NOT DETECTED Final   Bordetella Parapertussis NOT DETECTED NOT DETECTED Final   Chlamydophila pneumoniae NOT DETECTED NOT DETECTED Final   Mycoplasma pneumoniae NOT DETECTED NOT DETECTED Final    Comment: Performed at [facility], [address] [place], [place] [address]  MRSA Next Gen by PCR, Nasal     Status: Abnormal   Collection Time: 09/10/23 11:04 PM   Specimen: Nasal Mucosa; Nasal Swab  Result Value Ref Range Status   MRSA by PCR Next Gen DETECTED (A) NOT DETECTED Final    Comment: RESULT CALLED TO, READ BACK BY AND VERIFIED WITH: MARIA CONZALES@[email or web address]  09/11/23 RH (NOTE) The GeneXpert MRSA Assay (FDA approved for NASAL specimens only), is one component of a comprehensive MRSA colonization surveillance program. It is not intended to diagnose MRSA infection nor to guide or monitor treatment for MRSA infections. Test performance is not FDA approved in patients less than 39 years old. Performed at [facility], [address] [place], [place] [address]       Radiology Studies last 3 days: US RENAL  Result Date: 09/11/2023 CLINICAL DATA:  Acute on chronic renal failure EXAM: RENAL / URINARY TRACT ULTRASOUND COMPLETE COMPARISON:  07/29/2023.  CT 09/10/2023 FINDINGS: Right Kidney: Renal measurements: 10.3 x 6.2 x 5.8 cm = volume: 192 mL. Echogenicity within normal limits. No mass or hydronephrosis visualized. Left Kidney: Renal measurements: 7.0 x 3.5 x 2.8 cm = volume: 37 mL. Atrophic with cortical thinning. No mass or hydronephrosis. Bladder: Decompressed with Foley catheter in place. Other: Moderate  ascites in the abdomen and pelvis. IMPRESSION: No acute findings. Atrophic left  kidney, stable. Moderate ascites. Electronically Signed   By: Charlett Nose M.D.   On: 09/11/2023 19:18   ECHOCARDIOGRAM COMPLETE  Result Date: 09/11/2023    ECHOCARDIOGRAM REPORT   Patient Name:   Taylor [name] Date of Exam: 09/11/2023 Medical Rec #:  [id number]    Height:       70.0 in Accession #:    [id number]   Weight:       158.3 lb Date of Birth:  1941/11/15   BSA:  the duration of the COVID-19 declaration under Section 564(b)(1) of the Act, 21 U.S.C. section 360bbb-3(b)(1), unless the authorization is terminated or revoked sooner.  Performed at [facility], [address] [place], [place] [address]   Respiratory (~20 pathogens) panel by PCR     Status: None   Collection Time: 09/10/23  9:38 PM   Specimen: Nasopharyngeal Swab; Respiratory  Result Value Ref Range Status   Adenovirus NOT DETECTED NOT DETECTED Final   Coronavirus 229E NOT DETECTED NOT DETECTED Final    Comment: (NOTE) The Coronavirus on the Respiratory Panel, DOES NOT test for the novel  Coronavirus (2019 nCoV)    Coronavirus HKU1 NOT DETECTED NOT DETECTED Final   Coronavirus NL63 NOT DETECTED NOT DETECTED Final   Coronavirus OC43 NOT DETECTED NOT DETECTED Final   Metapneumovirus NOT DETECTED NOT DETECTED Final   Rhinovirus / Enterovirus NOT DETECTED NOT DETECTED Final   Influenza A NOT DETECTED NOT DETECTED Final   Influenza B NOT DETECTED NOT DETECTED Final   Parainfluenza Virus 1 NOT DETECTED  NOT DETECTED Final   Parainfluenza Virus 2 NOT DETECTED NOT DETECTED Final   Parainfluenza Virus 3 NOT DETECTED NOT DETECTED Final   Parainfluenza Virus 4 NOT DETECTED NOT DETECTED Final   Respiratory Syncytial Virus NOT DETECTED NOT DETECTED Final   Bordetella pertussis NOT DETECTED NOT DETECTED Final   Bordetella Parapertussis NOT DETECTED NOT DETECTED Final   Chlamydophila pneumoniae NOT DETECTED NOT DETECTED Final   Mycoplasma pneumoniae NOT DETECTED NOT DETECTED Final    Comment: Performed at [facility], [address] [place], [place] [address]  MRSA Next Gen by PCR, Nasal     Status: Abnormal   Collection Time: 09/10/23 11:04 PM   Specimen: Nasal Mucosa; Nasal Swab  Result Value Ref Range Status   MRSA by PCR Next Gen DETECTED (A) NOT DETECTED Final    Comment: RESULT CALLED TO, READ BACK BY AND VERIFIED WITH: MARIA CONZALES@[email or web address]  09/11/23 RH (NOTE) The GeneXpert MRSA Assay (FDA approved for NASAL specimens only), is one component of a comprehensive MRSA colonization surveillance program. It is not intended to diagnose MRSA infection nor to guide or monitor treatment for MRSA infections. Test performance is not FDA approved in patients less than 39 years old. Performed at [facility], [address] [place], [place] [address]       Radiology Studies last 3 days: US RENAL  Result Date: 09/11/2023 CLINICAL DATA:  Acute on chronic renal failure EXAM: RENAL / URINARY TRACT ULTRASOUND COMPLETE COMPARISON:  07/29/2023.  CT 09/10/2023 FINDINGS: Right Kidney: Renal measurements: 10.3 x 6.2 x 5.8 cm = volume: 192 mL. Echogenicity within normal limits. No mass or hydronephrosis visualized. Left Kidney: Renal measurements: 7.0 x 3.5 x 2.8 cm = volume: 37 mL. Atrophic with cortical thinning. No mass or hydronephrosis. Bladder: Decompressed with Foley catheter in place. Other: Moderate  ascites in the abdomen and pelvis. IMPRESSION: No acute findings. Atrophic left  kidney, stable. Moderate ascites. Electronically Signed   By: Charlett Nose M.D.   On: 09/11/2023 19:18   ECHOCARDIOGRAM COMPLETE  Result Date: 09/11/2023    ECHOCARDIOGRAM REPORT   Patient Name:   Taylor [name] Date of Exam: 09/11/2023 Medical Rec #:  [id number]    Height:       70.0 in Accession #:    [id number]   Weight:       158.3 lb Date of Birth:  1941/11/15   BSA:  the duration of the COVID-19 declaration under Section 564(b)(1) of the Act, 21 U.S.C. section 360bbb-3(b)(1), unless the authorization is terminated or revoked sooner.  Performed at [facility], [address] [place], [place] [address]   Respiratory (~20 pathogens) panel by PCR     Status: None   Collection Time: 09/10/23  9:38 PM   Specimen: Nasopharyngeal Swab; Respiratory  Result Value Ref Range Status   Adenovirus NOT DETECTED NOT DETECTED Final   Coronavirus 229E NOT DETECTED NOT DETECTED Final    Comment: (NOTE) The Coronavirus on the Respiratory Panel, DOES NOT test for the novel  Coronavirus (2019 nCoV)    Coronavirus HKU1 NOT DETECTED NOT DETECTED Final   Coronavirus NL63 NOT DETECTED NOT DETECTED Final   Coronavirus OC43 NOT DETECTED NOT DETECTED Final   Metapneumovirus NOT DETECTED NOT DETECTED Final   Rhinovirus / Enterovirus NOT DETECTED NOT DETECTED Final   Influenza A NOT DETECTED NOT DETECTED Final   Influenza B NOT DETECTED NOT DETECTED Final   Parainfluenza Virus 1 NOT DETECTED  NOT DETECTED Final   Parainfluenza Virus 2 NOT DETECTED NOT DETECTED Final   Parainfluenza Virus 3 NOT DETECTED NOT DETECTED Final   Parainfluenza Virus 4 NOT DETECTED NOT DETECTED Final   Respiratory Syncytial Virus NOT DETECTED NOT DETECTED Final   Bordetella pertussis NOT DETECTED NOT DETECTED Final   Bordetella Parapertussis NOT DETECTED NOT DETECTED Final   Chlamydophila pneumoniae NOT DETECTED NOT DETECTED Final   Mycoplasma pneumoniae NOT DETECTED NOT DETECTED Final    Comment: Performed at [facility], [address] [place], [place] [address]  MRSA Next Gen by PCR, Nasal     Status: Abnormal   Collection Time: 09/10/23 11:04 PM   Specimen: Nasal Mucosa; Nasal Swab  Result Value Ref Range Status   MRSA by PCR Next Gen DETECTED (A) NOT DETECTED Final    Comment: RESULT CALLED TO, READ BACK BY AND VERIFIED WITH: MARIA CONZALES@0105  09/11/23 RH (NOTE) The GeneXpert MRSA Assay (FDA approved for NASAL specimens only), is one component of a comprehensive MRSA colonization surveillance program. It is not intended to diagnose MRSA infection nor to guide or monitor treatment for MRSA infections. Test performance is not FDA approved in patients less than 39 years old. Performed at [facility], [address] [place], [place] [address]       Radiology Studies last 3 days: US RENAL  Result Date: 09/11/2023 CLINICAL DATA:  Acute on chronic renal failure EXAM: RENAL / URINARY TRACT ULTRASOUND COMPLETE COMPARISON:  07/29/2023.  CT 09/10/2023 FINDINGS: Right Kidney: Renal measurements: 10.3 x 6.2 x 5.8 cm = volume: 192 mL. Echogenicity within normal limits. No mass or hydronephrosis visualized. Left Kidney: Renal measurements: 7.0 x 3.5 x 2.8 cm = volume: 37 mL. Atrophic with cortical thinning. No mass or hydronephrosis. Bladder: Decompressed with Foley catheter in place. Other: Moderate  ascites in the abdomen and pelvis. IMPRESSION: No acute findings. Atrophic left  kidney, stable. Moderate ascites. Electronically Signed   By: Charlett Nose M.D.   On: 09/11/2023 19:18   ECHOCARDIOGRAM COMPLETE  Result Date: 09/11/2023    ECHOCARDIOGRAM REPORT   Patient Name:   Taylor [name] Date of Exam: 09/11/2023 Medical Rec #:  [id number]    Height:       70.0 in Accession #:    [id number]   Weight:       158.3 lb Date of Birth:  1941/11/15   BSA:  PROGRESS NOTE    LOGEN KYNE   ZOX:[id number] DOB: 08-27-1941  DOA: 09/10/2023 Date of Service: 09/12/23 which is hospital day 2  PCP: Malva Limes, MD    HPI: Taylor [name] is a 82 y.o. male past medical history significant for HFrEF (< 20%), CKD stage IV, hypertension, hyperlipidemia, diabetes, COPD on chronic home 2 L, CAD, history of atrial fibrillation on Eliquis, who presents to ED from home via EMS for fall x3. Multiple falls past month. Concern for low BP. No LOC.   Pertinent recent tx/imaging: 10/04: visit ED due to fall - (+)minimally displaced and comminuted radial styloid fracture --> splint to left forearm. (+)fx nasal bones and L maxilla.   10/10: ENT visit, discuss repair options for facial fx, not interested, conservative mgt.   Hospital course / significant events:  10/16: to ED. (+)sepsis, severe - hypothermic Tmin 40F, low BP 80s/60s, AKI, hyperkalemic K max 6.8 improved w/ tx, Cr on admission 5.46 above baseline 2.7, lactic max 3.8. CT (+)PNA RML and LLL, mod R pleural effusion also noted on previous CT. Tx midodrine, steroids, fluids, vanc/cefepime.  10/17: BP improved, renal fxn and K improved, Echo LVEF <20%, severely decreased fxn and global hypokinesis, grade III diastolic dysfunction, severe RV overload, severe RA and LA dilation. Renal US pending read. Palliative care consult, see note. Cardiology consult 10/18: stable off pressors, transfer to progressive unit. Continue to monitor renal/cardiac function closely.   Consultants:  Nephrology PCCU Palliative care  Cardiology, advanced HF team   Procedures/Surgeries: none      ASSESSMENT & PLAN:    INFECTIOUS   Severe sepsis due to HCAP on CT, RML, LLL. POA (+)MRSA screen, pending cultures  Neg COVID and resp viral PCR  meets criteria for severe sepsis with WBC 13.9, hypothermia with temperature 92.1.  Lactic acid is elevated at 3.8 --> 2.9. Patient has been persistently hypotensive. admit to  SDU as inp Vancomycin and cefepime initiated 10/16 and patient received 1 dose of Levaquin in ED. MRSA screen (+), continue vancomycin  Blood and sputum cultures, urine antigen of Legionella and strep IV fluid: total of 1.75 L of NS, then 150 mEq of sodium bicarbonate at 75 cc/h - caution w/ Hx CHF Midodrine Phenylephrine d/c this morning - monitor and low threshold to restart pressors  Hypothermia - resolved Bair hugger Tx sepsis as above     RENAL  Acute renal failure superimposed on stage 4 chronic kidney disease Hyperkalemia and hyperphosphatemia likely as result of AKI  creatinine 5.46, BUN 93 and GFR 10 (recent baseline creatinine 2.82 and GFR 22 on 10//24) --> Cr 5.17 today 09/12/23  Ddx include UTI, dehydration and continuation of diuretics.  ATN is possible due to hypotension. Question hepatorenal syndrome.   Nephrology and cardiology following  holding potassium and spironolactone Holding diuretics bicarb infusion. Hold nephrotoxic rx  Follow UA --> no UTI, (+)hyaline casts, (+)micro hematuria Renal US --> pending read   Hyperkalemia:  Potassium 6.8 --> 4.4. Likely d/t AKI/ATN pt was treated with 1 g calcium gluconate, 5 units of NovoLog, 50 cc of D50, 50 mEq of sodium bicarbonate in ED S/p Patiromer 16.8 g  Nephrology following  consulted pharmacist for electrolytes monitoring  Hyponatremia likely d/t AKI Monitor BMP Treat underlying causes    CARDIAC  Chronic combined systolic and diastolic CHF (congestive heart failure)  2D echo on 10/30/2022 showed EF<20%.   Patient has elevated BNP 647 which is below his baseline, no leg edema or  PROGRESS NOTE    LOGEN KYNE   ZOX:[id number] DOB: 08-27-1941  DOA: 09/10/2023 Date of Service: 09/12/23 which is hospital day 2  PCP: Malva Limes, MD    HPI: Taylor [name] is a 82 y.o. male past medical history significant for HFrEF (< 20%), CKD stage IV, hypertension, hyperlipidemia, diabetes, COPD on chronic home 2 L, CAD, history of atrial fibrillation on Eliquis, who presents to ED from home via EMS for fall x3. Multiple falls past month. Concern for low BP. No LOC.   Pertinent recent tx/imaging: 10/04: visit ED due to fall - (+)minimally displaced and comminuted radial styloid fracture --> splint to left forearm. (+)fx nasal bones and L maxilla.   10/10: ENT visit, discuss repair options for facial fx, not interested, conservative mgt.   Hospital course / significant events:  10/16: to ED. (+)sepsis, severe - hypothermic Tmin 40F, low BP 80s/60s, AKI, hyperkalemic K max 6.8 improved w/ tx, Cr on admission 5.46 above baseline 2.7, lactic max 3.8. CT (+)PNA RML and LLL, mod R pleural effusion also noted on previous CT. Tx midodrine, steroids, fluids, vanc/cefepime.  10/17: BP improved, renal fxn and K improved, Echo LVEF <20%, severely decreased fxn and global hypokinesis, grade III diastolic dysfunction, severe RV overload, severe RA and LA dilation. Renal US pending read. Palliative care consult, see note. Cardiology consult 10/18: stable off pressors, transfer to progressive unit. Continue to monitor renal/cardiac function closely.   Consultants:  Nephrology PCCU Palliative care  Cardiology, advanced HF team   Procedures/Surgeries: none      ASSESSMENT & PLAN:    INFECTIOUS   Severe sepsis due to HCAP on CT, RML, LLL. POA (+)MRSA screen, pending cultures  Neg COVID and resp viral PCR  meets criteria for severe sepsis with WBC 13.9, hypothermia with temperature 92.1.  Lactic acid is elevated at 3.8 --> 2.9. Patient has been persistently hypotensive. admit to  SDU as inp Vancomycin and cefepime initiated 10/16 and patient received 1 dose of Levaquin in ED. MRSA screen (+), continue vancomycin  Blood and sputum cultures, urine antigen of Legionella and strep IV fluid: total of 1.75 L of NS, then 150 mEq of sodium bicarbonate at 75 cc/h - caution w/ Hx CHF Midodrine Phenylephrine d/c this morning - monitor and low threshold to restart pressors  Hypothermia - resolved Bair hugger Tx sepsis as above     RENAL  Acute renal failure superimposed on stage 4 chronic kidney disease Hyperkalemia and hyperphosphatemia likely as result of AKI  creatinine 5.46, BUN 93 and GFR 10 (recent baseline creatinine 2.82 and GFR 22 on 10//24) --> Cr 5.17 today 09/12/23  Ddx include UTI, dehydration and continuation of diuretics.  ATN is possible due to hypotension. Question hepatorenal syndrome.   Nephrology and cardiology following  holding potassium and spironolactone Holding diuretics bicarb infusion. Hold nephrotoxic rx  Follow UA --> no UTI, (+)hyaline casts, (+)micro hematuria Renal US --> pending read   Hyperkalemia:  Potassium 6.8 --> 4.4. Likely d/t AKI/ATN pt was treated with 1 g calcium gluconate, 5 units of NovoLog, 50 cc of D50, 50 mEq of sodium bicarbonate in ED S/p Patiromer 16.8 g  Nephrology following  consulted pharmacist for electrolytes monitoring  Hyponatremia likely d/t AKI Monitor BMP Treat underlying causes    CARDIAC  Chronic combined systolic and diastolic CHF (congestive heart failure)  2D echo on 10/30/2022 showed EF<20%.   Patient has elevated BNP 647 which is below his baseline, no leg edema or  the duration of the COVID-19 declaration under Section 564(b)(1) of the Act, 21 U.S.C. section 360bbb-3(b)(1), unless the authorization is terminated or revoked sooner.  Performed at [facility], [address] [place], [place] [address]   Respiratory (~20 pathogens) panel by PCR     Status: None   Collection Time: 09/10/23  9:38 PM   Specimen: Nasopharyngeal Swab; Respiratory  Result Value Ref Range Status   Adenovirus NOT DETECTED NOT DETECTED Final   Coronavirus 229E NOT DETECTED NOT DETECTED Final    Comment: (NOTE) The Coronavirus on the Respiratory Panel, DOES NOT test for the novel  Coronavirus (2019 nCoV)    Coronavirus HKU1 NOT DETECTED NOT DETECTED Final   Coronavirus NL63 NOT DETECTED NOT DETECTED Final   Coronavirus OC43 NOT DETECTED NOT DETECTED Final   Metapneumovirus NOT DETECTED NOT DETECTED Final   Rhinovirus / Enterovirus NOT DETECTED NOT DETECTED Final   Influenza A NOT DETECTED NOT DETECTED Final   Influenza B NOT DETECTED NOT DETECTED Final   Parainfluenza Virus 1 NOT DETECTED  NOT DETECTED Final   Parainfluenza Virus 2 NOT DETECTED NOT DETECTED Final   Parainfluenza Virus 3 NOT DETECTED NOT DETECTED Final   Parainfluenza Virus 4 NOT DETECTED NOT DETECTED Final   Respiratory Syncytial Virus NOT DETECTED NOT DETECTED Final   Bordetella pertussis NOT DETECTED NOT DETECTED Final   Bordetella Parapertussis NOT DETECTED NOT DETECTED Final   Chlamydophila pneumoniae NOT DETECTED NOT DETECTED Final   Mycoplasma pneumoniae NOT DETECTED NOT DETECTED Final    Comment: Performed at [facility], [address] [place], [place] [address]  MRSA Next Gen by PCR, Nasal     Status: Abnormal   Collection Time: 09/10/23 11:04 PM   Specimen: Nasal Mucosa; Nasal Swab  Result Value Ref Range Status   MRSA by PCR Next Gen DETECTED (A) NOT DETECTED Final    Comment: RESULT CALLED TO, READ BACK BY AND VERIFIED WITH: MARIA CONZALES@[id number]  09/11/23 RH (NOTE) The GeneXpert MRSA Assay (FDA approved for NASAL specimens only), is one component of a comprehensive MRSA colonization surveillance program. It is not intended to diagnose MRSA infection nor to guide or monitor treatment for MRSA infections. Test performance is not FDA approved in patients less than 39 years old. Performed at [facility], [address] [place], [place] [address]       Radiology Studies last 3 days: US RENAL  Result Date: 09/11/2023 CLINICAL DATA:  Acute on chronic renal failure EXAM: RENAL / URINARY TRACT ULTRASOUND COMPLETE COMPARISON:  07/29/2023.  CT 09/10/2023 FINDINGS: Right Kidney: Renal measurements: 10.3 x 6.2 x 5.8 cm = volume: 192 mL. Echogenicity within normal limits. No mass or hydronephrosis visualized. Left Kidney: Renal measurements: 7.0 x 3.5 x 2.8 cm = volume: 37 mL. Atrophic with cortical thinning. No mass or hydronephrosis. Bladder: Decompressed with Foley catheter in place. Other: Moderate  ascites in the abdomen and pelvis. IMPRESSION: No acute findings. Atrophic left  kidney, stable. Moderate ascites. Electronically Signed   By: Charlett Nose M.D.   On: 09/11/2023 19:18   ECHOCARDIOGRAM COMPLETE  Result Date: 09/11/2023    ECHOCARDIOGRAM REPORT   Patient Name:   Taylor [name] Date of Exam: 09/11/2023 Medical Rec #:  [id number]    Height:       70.0 in Accession #:    [id number]   Weight:       158.3 lb Date of Birth:  1941/11/15   BSA:  the duration of the COVID-19 declaration under Section 564(b)(1) of the Act, 21 U.S.C. section 360bbb-3(b)(1), unless the authorization is terminated or revoked sooner.  Performed at [facility], [address] [place], [place] [address]   Respiratory (~20 pathogens) panel by PCR     Status: None   Collection Time: 09/10/23  9:38 PM   Specimen: Nasopharyngeal Swab; Respiratory  Result Value Ref Range Status   Adenovirus NOT DETECTED NOT DETECTED Final   Coronavirus 229E NOT DETECTED NOT DETECTED Final    Comment: (NOTE) The Coronavirus on the Respiratory Panel, DOES NOT test for the novel  Coronavirus (2019 nCoV)    Coronavirus HKU1 NOT DETECTED NOT DETECTED Final   Coronavirus NL63 NOT DETECTED NOT DETECTED Final   Coronavirus OC43 NOT DETECTED NOT DETECTED Final   Metapneumovirus NOT DETECTED NOT DETECTED Final   Rhinovirus / Enterovirus NOT DETECTED NOT DETECTED Final   Influenza A NOT DETECTED NOT DETECTED Final   Influenza B NOT DETECTED NOT DETECTED Final   Parainfluenza Virus 1 NOT DETECTED  NOT DETECTED Final   Parainfluenza Virus 2 NOT DETECTED NOT DETECTED Final   Parainfluenza Virus 3 NOT DETECTED NOT DETECTED Final   Parainfluenza Virus 4 NOT DETECTED NOT DETECTED Final   Respiratory Syncytial Virus NOT DETECTED NOT DETECTED Final   Bordetella pertussis NOT DETECTED NOT DETECTED Final   Bordetella Parapertussis NOT DETECTED NOT DETECTED Final   Chlamydophila pneumoniae NOT DETECTED NOT DETECTED Final   Mycoplasma pneumoniae NOT DETECTED NOT DETECTED Final    Comment: Performed at [facility], [address] [place], [place] [address]  MRSA Next Gen by PCR, Nasal     Status: Abnormal   Collection Time: 09/10/23 11:04 PM   Specimen: Nasal Mucosa; Nasal Swab  Result Value Ref Range Status   MRSA by PCR Next Gen DETECTED (A) NOT DETECTED Final    Comment: RESULT CALLED TO, READ BACK BY AND VERIFIED WITH: MARIA CONZALES@[email or web address]  09/11/23 RH (NOTE) The GeneXpert MRSA Assay (FDA approved for NASAL specimens only), is one component of a comprehensive MRSA colonization surveillance program. It is not intended to diagnose MRSA infection nor to guide or monitor treatment for MRSA infections. Test performance is not FDA approved in patients less than 39 years old. Performed at [facility], [address] [place], [place] [address]       Radiology Studies last 3 days: US RENAL  Result Date: 09/11/2023 CLINICAL DATA:  Acute on chronic renal failure EXAM: RENAL / URINARY TRACT ULTRASOUND COMPLETE COMPARISON:  07/29/2023.  CT 09/10/2023 FINDINGS: Right Kidney: Renal measurements: 10.3 x 6.2 x 5.8 cm = volume: 192 mL. Echogenicity within normal limits. No mass or hydronephrosis visualized. Left Kidney: Renal measurements: 7.0 x 3.5 x 2.8 cm = volume: 37 mL. Atrophic with cortical thinning. No mass or hydronephrosis. Bladder: Decompressed with Foley catheter in place. Other: Moderate  ascites in the abdomen and pelvis. IMPRESSION: No acute findings. Atrophic left  kidney, stable. Moderate ascites. Electronically Signed   By: Charlett Nose M.D.   On: 09/11/2023 19:18   ECHOCARDIOGRAM COMPLETE  Result Date: 09/11/2023    ECHOCARDIOGRAM REPORT   Patient Name:   Taylor [name] Date of Exam: 09/11/2023 Medical Rec #:  [id number]    Height:       70.0 in Accession #:    [id number]   Weight:       158.3 lb Date of Birth:  1941/11/15   BSA:

## 2023-09-12 NOTE — Plan of Care (Signed)
  Problem: Education: Goal: Knowledge of cardiac device and self-care will improve Outcome: Progressing Goal: Ability to safely manage health related needs after discharge will improve Outcome: Progressing Goal: Individualized Educational Video(s) Outcome: Progressing   Problem: Cardiac: Goal: Ability to achieve and maintain adequate cardiopulmonary perfusion will improve Outcome: Progressing   Problem: Education: Goal: Ability to describe self-care measures that may prevent or decrease complications (Diabetes Survival Skills Education) will improve Outcome: Progressing Goal: Individualized Educational Video(s) Outcome: Progressing   Problem: Coping: Goal: Ability to adjust to condition or change in health will improve Outcome: Progressing   Problem: Fluid Volume: Goal: Ability to maintain a balanced intake and output will improve Outcome: Progressing   Problem: Health Behavior/Discharge Planning: Goal: Ability to identify and utilize available resources and services will improve Outcome: Progressing Goal: Ability to manage health-related needs will improve Outcome: Progressing   Problem: Metabolic: Goal: Ability to maintain appropriate glucose levels will improve Outcome: Progressing   Problem: Nutritional: Goal: Maintenance of adequate nutrition will improve Outcome: Progressing Goal: Progress toward achieving an optimal weight will improve Outcome: Progressing   Problem: Skin Integrity: Goal: Risk for impaired skin integrity will decrease Outcome: Progressing   Problem: Tissue Perfusion: Goal: Adequacy of tissue perfusion will improve Outcome: Progressing   Problem: Education: Goal: Knowledge of General Education information will improve Description: Including pain rating scale, medication(s)/side effects and non-pharmacologic comfort measures Outcome: Progressing   Problem: Health Behavior/Discharge Planning: Goal: Ability to manage health-related needs  will improve Outcome: Progressing   Problem: Clinical Measurements: Goal: Ability to maintain clinical measurements within normal limits will improve Outcome: Progressing Goal: Will remain free from infection Outcome: Progressing Goal: Diagnostic test results will improve Outcome: Progressing Goal: Respiratory complications will improve Outcome: Progressing Goal: Cardiovascular complication will be avoided Outcome: Progressing   Problem: Activity: Goal: Risk for activity intolerance will decrease Outcome: Progressing   Problem: Nutrition: Goal: Adequate nutrition will be maintained Outcome: Progressing   Problem: Coping: Goal: Level of anxiety will decrease Outcome: Progressing   Problem: Elimination: Goal: Will not experience complications related to bowel motility Outcome: Progressing Goal: Will not experience complications related to urinary retention Outcome: Progressing   Problem: Pain Managment: Goal: General experience of comfort will improve Outcome: Progressing   Problem: Safety: Goal: Ability to remain free from injury will improve Outcome: Progressing   Problem: Skin Integrity: Goal: Risk for impaired skin integrity will decrease Outcome: Progressing

## 2023-09-12 NOTE — Progress Notes (Signed)
Report called to [employer] [employer]

## 2023-09-12 NOTE — Progress Notes (Addendum)
PHARMACY CONSULT NOTE - ELECTROLYTES  Pharmacy Consult for Electrolyte Monitoring and Replacement   Recent Labs: Height: 5\' 10"  (177.8 cm) Weight: 75.8 kg (167 lb 1.7 oz) IBW/kg (Calculated) : 73 Estimated Creatinine Clearance: 11.8 mL/min (A) (by C-G formula based on SCr of 5.17 mg/dL (H)).  Potassium (mmol/L)  Date Value  09/12/2023 4.4   Magnesium (mg/dL)  Date Value  29/52/8413 2.3   Calcium (mg/dL)  Date Value  24/40/1027 8.8 (L)   Albumin (g/dL)  Date Value  25/36/6440 2.8 (L)  05/05/2023 3.1 (L)   Phosphorus (mg/dL)  Date Value  34/74/2595 7.0 (H)   Sodium (mmol/L)  Date Value  09/12/2023 131 (L)  05/05/2023 141   Corrected Ca: 9.76 mg/dL  Assessment  sepsis without clear source  Taylor [name] is a 82 y.o. male presenting with sepsis. PMH significant for CHF with EF < 20%, CKD-4, HTN, HLD, DM, COPD on 2L O2, CAD with stent and hx of CABG, stroke, A fib on Eliquis, s/p of AICD, melanoma, psoriatic arthritis, pancreatitis . Pharmacy has been consulted to monitor hyperkalemia due to worsening renal function.  Diet: PO Pertinent medications: None  Goal of Therapy: Electrolytes WNL  Plan:  No electrolyte replacement warranted at this time Patient care transferred from PCCM to [facility]. Will sign off at this time and pharmacy will continue to monitor electrolytes peripherally.   Thank you for allowing pharmacy to be a part of this patient's care.  Littie Deeds, PharmD Pharmacy Resident  09/12/2023 7:24 AM

## 2023-09-12 NOTE — [place] FL2 (Signed)
[place] MEDICAID FL2 LEVEL OF CARE FORM     IDENTIFICATION  Patient Name: Taylor [name] Birthdate: 1941-11-11 Sex: male Admission Date (Current Location): 09/10/2023  [place] and [place] Number:  Chiropodist and Address:  [facility], [address], [place], [place] [address]      Provider Number: [id number]  Attending Physician Name and Address:  Sunnie Nielsen, DO  Relative Name and Phone Number:  Zaccai, Drudge (Spouse)  [phone] (Mobile)    Current Level of Care: Hospital Recommended Level of Care: Skilled Nursing Facility Prior Approval Number:    Date Approved/Denied:   PASRR Number: [id number]  Discharge Plan: ICF    Current Diagnoses: Patient Active Problem List   Diagnosis Date Noted   Septic shock (HCC) 09/10/2023   Acute renal failure superimposed on stage 4 chronic kidney disease (HCC) 09/10/2023   Chronic combined systolic and diastolic CHF (congestive heart failure) (HCC) 09/10/2023   Hypothermia 09/10/2023   Hyperkalemia 09/10/2023   Myocardial injury 09/10/2023   Acute kidney injury superimposed on stage 4 chronic kidney disease (HCC) 09/10/2023   Hypotension 09/10/2023   Liver cirrhosis (HCC) 09/10/2023   Radial styloid fracture_left 09/10/2023   HCAP (healthcare-associated pneumonia) 09/10/2023   Anasarca 04/29/2023   Abdominal distension 04/29/2023   Fall 04/29/2023   Acute on chronic congestive heart failure (HCC) 04/23/2023   Abdominal ascites 02/25/2023   Hyperbilirubinemia 02/25/2023   Chronic obstructive pulmonary disease, unspecified COPD type (HCC) 01/03/2023   Severe mitral regurgitation 10/31/2022   HFrEF (heart failure with reduced ejection fraction) (HCC) 10/31/2022   Weakness 10/30/2022   Acute pulmonary edema (HCC) 10/30/2022   DOE (dyspnea on exertion) 10/30/2022   Hypoxia 10/30/2022   Abdominal aortic aneurysm (AAA) (HCC) 10/30/2022   Dyspnea 10/30/2022   Orthopnea 10/15/2022    Acute on chronic combined systolic and diastolic CHF (congestive heart failure) (HCC) 10/15/2022   Carotid stenosis, symptomatic w/o infarct, left 07/05/2022   Atrophy of kidney 09/27/2021   Atherosclerotic heart disease of native coronary artery with unspecified angina pectoris (HCC) 08/22/2021   Implantable cardioverter-defibrillator (ICD) in situ 08/22/2021   Hypertension 08/22/2021   Idiopathic gout, unspecified site 08/22/2021   Pure hypercholesterolemia, unspecified 08/22/2021   Aortic atherosclerosis (HCC) 05/08/2021   Type 2 diabetes mellitus with diabetic chronic kidney disease (HCC) 11/21/2020   Hypomagnesemia 04/26/2020   Numbness and tingling in both hands 04/19/2020   Atrial fibrillation (HCC) 04/19/2020   Other specified interstitial pulmonary diseases (HCC) 12/06/2019   Abdominal pain    Hyperuricemia 03/31/2019   Numbness 05/07/2018   Benign colonic polyp 03/24/2017   Tibialis anterior tenosynovitis 12/21/2015   Pleural nodule 07/27/2015   Restrictive lung disease 05/12/2015   Barrett esophagus 03/30/2015   Ache in joint 03/30/2015   Bradycardia 03/30/2015   CAD S/P percutaneous coronary angioplasty 03/30/2015   Cardiac defibrillator in place 03/30/2015   Chronic kidney disease, stage 4 (severe) (HCC) 03/30/2015   Cluster headache syndrome 03/30/2015   Degeneration of lumbar or lumbosacral intervertebral disc 03/30/2015   Gout 03/30/2015   Personal history of malignant neoplasm of bladder 03/30/2015   Hypercholesteremia 03/30/2015   Low back pain 03/30/2015   Personal history of malignant melanoma of skin 03/30/2015   Arthritis of knee, degenerative 03/30/2015   Obstructive sleep apnea 03/30/2015   Chronic venous insufficiency 03/30/2015   Psoriatic arthritis (HCC) 03/30/2015   History of abdominal aortic aneurysm (AAA) 06/24/2013   History of CVA (cerebrovascular accident) 06/24/2013   Cardiomyopathy, ischemic 06/24/2013  Essential (primary) hypertension  08/17/2011   Arrhythmia, sinus node 08/17/2011   History of ventricular fibrillation 08/17/2011   Systolic CHF with reduced left ventricular function, NYHA class 3 (HCC) 02/26/2010   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 04/27/2007   Cardiac pacemaker in situ 10/10/2006    Orientation RESPIRATION BLADDER Height & Weight     Self, Time, Situation, Place  O2 (O22L per nasal cannula) External catheter, Incontinent Weight: 75.8 kg Height:  5\' 10"  (177.8 cm)  BEHAVIORAL SYMPTOMS/MOOD NEUROLOGICAL BOWEL NUTRITION STATUS  Other (Comment) (n/a)  (n/a) Continent Diet (Heart Healthy)  AMBULATORY STATUS COMMUNICATION OF NEEDS Skin   Limited Assist Verbally Bruising, Skin abrasions (face and arms)                       Personal Care Assistance Level of Assistance  Bathing, Dressing Bathing Assistance: Limited assistance   Dressing Assistance: Limited assistance     Functional Limitations Info             SPECIAL CARE FACTORS FREQUENCY  PT (By licensed PT), OT (By licensed OT)     PT Frequency: Min 2x weekly OT Frequency: Min 2x weekly            Contractures Contractures Info: Not present    Additional Factors Info  Code Status, Allergies Code Status Info: FULL Allergies Info: Amlodipine Besylate, Crestor (Rosuvastatin)           Current Medications (09/12/2023):  This is the current hospital active medication list Current Facility-Administered Medications  Medication Dose Route Frequency Provider Last Rate Last Admin   acetaminophen (TYLENOL) tablet 650 mg  650 mg Oral Q6H PRN Lorretta Harp, MD       aspirin EC tablet 81 mg  81 mg Oral Daily Lorretta Harp, MD   81 mg at 09/12/23 5621   Chlorhexidine Gluconate Cloth 2 % PADS 6 each  6 each Topical QHS Lorretta Harp, MD   6 each at 09/11/23 2104   dextromethorphan-guaiFENesin (MUCINEX DM) 30-600 MG per 12 hr tablet 1 tablet  1 tablet Oral BID PRN Lorretta Harp, MD       feeding supplement (NEPRO CARB STEADY)  liquid 237 mL  237 mL Oral TID BM Sunnie Nielsen, DO   237 mL at 09/12/23 1416   heparin injection 5,000 Units  5,000 Units Subcutaneous Q8H Erin Fulling, MD   5,000 Units at 09/12/23 1418   insulin aspart (novoLOG) injection 0-5 Units  0-5 Units Subcutaneous QHS Lorretta Harp, MD       insulin aspart (novoLOG) injection 0-9 Units  0-9 Units Subcutaneous TID WC Lorretta Harp, MD   1 Units at 09/12/23 1203   ipratropium-albuterol (DUONEB) 0.5-2.5 (3) MG/3ML nebulizer solution 3 mL  3 mL Nebulization Q4H PRN Rust-Chester, Britton L, NP       lactulose (CHRONULAC) 10 GM/15ML solution 10 g  10 g Oral BID Lorretta Harp, MD   10 g at 09/12/23 0946   midodrine (PROAMATINE) tablet 5 mg  5 mg Oral TID WC Lorretta Harp, MD   5 mg at 09/12/23 1204   multivitamin with minerals tablet 1 tablet  1 tablet Oral Daily Sunnie Nielsen, DO   1 tablet at 09/12/23 0956   mupirocin ointment (BACTROBAN) 2 % 1 Application  1 Application Nasal BID Lorretta Harp, MD   1 Application at 09/12/23 0947   ondansetron (ZOFRAN) injection 4 mg  4 mg Intravenous Q8H PRN Lorretta Harp, MD  pantoprazole (PROTONIX) EC tablet 40 mg  40 mg Oral Daily Rust-Chester, Britton L, NP   40 mg at 09/12/23 0947   sodium chloride flush (NS) 0.9 % injection 10 mL  10 mL Intravenous Q12H Mumma, Carollee Herter, MD   10 mL at 09/12/23 0948   triamcinolone ointment (KENALOG) 0.1 %   Topical BID Rust-Chester, Cecelia Byars, NP   1 Application at 09/12/23 7829     Discharge Medications: Please see discharge summary for a list of discharge medications.  Relevant Imaging Results:  Relevant Lab Results:   Additional Information SSN# [id number]  Truddie Hidden, RN

## 2023-09-12 NOTE — Plan of Care (Signed)
  Problem: Education: Goal: Knowledge of cardiac device and self-care will improve Outcome: Progressing Goal: Ability to safely manage health related needs after discharge will improve Outcome: Progressing   Problem: Cardiac: Goal: Ability to achieve and maintain adequate cardiopulmonary perfusion will improve Outcome: Progressing   Problem: Education: Goal: Ability to describe self-care measures that may prevent or decrease complications (Diabetes Survival Skills Education) will improve Outcome: Progressing   Problem: Coping: Goal: Ability to adjust to condition or change in health will improve Outcome: Progressing   Problem: Fluid Volume: Goal: Ability to maintain a balanced intake and output will improve Outcome: Progressing   Problem: Health Behavior/Discharge Planning: Goal: Ability to identify and utilize available resources and services will improve Outcome: Progressing Goal: Ability to manage health-related needs will improve Outcome: Progressing   Problem: Metabolic: Goal: Ability to maintain appropriate glucose levels will improve Outcome: Progressing   Problem: Nutritional: Goal: Maintenance of adequate nutrition will improve Outcome: Progressing Goal: Progress toward achieving an optimal weight will improve Outcome: Progressing   Problem: Skin Integrity: Goal: Risk for impaired skin integrity will decrease Outcome: Progressing   Problem: Tissue Perfusion: Goal: Adequacy of tissue perfusion will improve Outcome: Progressing

## 2023-09-12 NOTE — TOC Initial Note (Signed)
Transition of Care [facility]) - Initial/Assessment Note    Patient Details  Name: Taylor [name] MRN: [id number] Date of Birth: 1941/04/17  Transition of Care [facility]) CM/SW Contact:    Truddie Hidden, RN Phone Number: 09/12/2023, 3:47 PM  Clinical Narrative:     RA completed               Admitted WUJ:[id number]  Admitted from:Home  Pharmacy:[facility] Current home health/prior home health/DME: None  Patient advised of therapy's recommendation for SNF. He is agreeable and does not have a preference.  Of a facility. Bed search initiated.    Expected Discharge Plan: Skilled Nursing Facility Barriers to Discharge: Continued Medical Work up   Patient Goals and CMS Choice Patient states their goals for this hospitalization and ongoing recovery are:: SNF          Expected Discharge Plan and Services       Living arrangements for the past 2 months: Single Family Home                                      Prior Living Arrangements/Services Living arrangements for the past 2 months: Single Family Home Lives with:: Spouse Patient language and need for interpreter reviewed:: Yes Do you feel safe going back to the place where you live?: Yes      Need for Family Participation in Patient Care: Yes (Comment) Care giver support system in place?: Yes (comment)   Criminal Activity/Legal Involvement Pertinent to Current Situation/Hospitalization: No - Comment as needed  Activities of Daily Living      Permission Sought/Granted                  Emotional Assessment Appearance:: Appears stated age Attitude/Demeanor/Rapport: Gracious, Engaged Affect (typically observed): Accepting Orientation: : Oriented to Self, Oriented to Place, Oriented to  Time, Oriented to Situation Alcohol / Substance Use: Not Applicable Psych Involvement: No (comment)  Admission diagnosis:  Hyperkalemia [E87.5] SIRS (systemic inflammatory response syndrome) (HCC) [R65.10] AKI (acute  kidney injury) (HCC) [N17.9] Injury of head, initial encounter [S09.90XA] Fall, initial encounter [W19.XXXA] Acute kidney injury superimposed on stage 4 chronic kidney disease (HCC) [N17.9, N18.4] Patient Active Problem List   Diagnosis Date Noted   Septic shock (HCC) 09/10/2023   Acute renal failure superimposed on stage 4 chronic kidney disease (HCC) 09/10/2023   Chronic combined systolic and diastolic CHF (congestive heart failure) (HCC) 09/10/2023   Hypothermia 09/10/2023   Hyperkalemia 09/10/2023   Myocardial injury 09/10/2023   Acute kidney injury superimposed on stage 4 chronic kidney disease (HCC) 09/10/2023   Hypotension 09/10/2023   Liver cirrhosis (HCC) 09/10/2023   Radial styloid fracture_left 09/10/2023   HCAP (healthcare-associated pneumonia) 09/10/2023   Anasarca 04/29/2023   Abdominal distension 04/29/2023   Fall 04/29/2023   Acute on chronic congestive heart failure (HCC) 04/23/2023   Abdominal ascites 02/25/2023   Hyperbilirubinemia 02/25/2023   Chronic obstructive pulmonary disease, unspecified COPD type (HCC) 01/03/2023   Severe mitral regurgitation 10/31/2022   HFrEF (heart failure with reduced ejection fraction) (HCC) 10/31/2022   Weakness 10/30/2022   Acute pulmonary edema (HCC) 10/30/2022   DOE (dyspnea on exertion) 10/30/2022   Hypoxia 10/30/2022   Abdominal aortic aneurysm (AAA) (HCC) 10/30/2022   Dyspnea 10/30/2022   Orthopnea 10/15/2022   Acute on chronic combined systolic and diastolic CHF (congestive heart failure) (HCC) 10/15/2022   Carotid stenosis, symptomatic w/o infarct, left  07/05/2022   Atrophy of kidney 09/27/2021   Atherosclerotic heart disease of native coronary artery with unspecified angina pectoris (HCC) 08/22/2021   Implantable cardioverter-defibrillator (ICD) in situ 08/22/2021   Hypertension 08/22/2021   Idiopathic gout, unspecified site 08/22/2021   Pure hypercholesterolemia, unspecified 08/22/2021   Aortic atherosclerosis (HCC)  05/08/2021   Type 2 diabetes mellitus with diabetic chronic kidney disease (HCC) 11/21/2020   Hypomagnesemia 04/26/2020   Numbness and tingling in both hands 04/19/2020   Atrial fibrillation (HCC) 04/19/2020   Other specified interstitial pulmonary diseases (HCC) 12/06/2019   Abdominal pain    Hyperuricemia 03/31/2019   Numbness 05/07/2018   Benign colonic polyp 03/24/2017   Tibialis anterior tenosynovitis 12/21/2015   Pleural nodule 07/27/2015   Restrictive lung disease 05/12/2015   Barrett esophagus 03/30/2015   Ache in joint 03/30/2015   Bradycardia 03/30/2015   CAD S/P percutaneous coronary angioplasty 03/30/2015   Cardiac defibrillator in place 03/30/2015   Chronic kidney disease, stage 4 (severe) (HCC) 03/30/2015   Cluster headache syndrome 03/30/2015   Degeneration of lumbar or lumbosacral intervertebral disc 03/30/2015   Gout 03/30/2015   Personal history of malignant neoplasm of bladder 03/30/2015   Hypercholesteremia 03/30/2015   Low back pain 03/30/2015   Personal history of malignant melanoma of skin 03/30/2015   Arthritis of knee, degenerative 03/30/2015   Obstructive sleep apnea 03/30/2015   Chronic venous insufficiency 03/30/2015   Psoriatic arthritis (HCC) 03/30/2015   History of abdominal aortic aneurysm (AAA) 06/24/2013   History of CVA (cerebrovascular accident) 06/24/2013   Cardiomyopathy, ischemic 06/24/2013   Essential (primary) hypertension 08/17/2011   Arrhythmia, sinus node 08/17/2011   History of ventricular fibrillation 08/17/2011   Systolic CHF with reduced left ventricular function, NYHA class 3 (HCC) 02/26/2010   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 04/27/2007   Cardiac pacemaker in situ 10/10/2006   PCP:  Malva Limes, MD Pharmacy:   [facility] [phone] [facility], [place] - [address] AT [facility] OF [address] [place] [address] Phone: [phone] Fax: [phone]     Social  Determinants of Health (SDOH) Social History: SDOH Screenings   Food Insecurity: No Food Insecurity (05/12/2023)  Housing: Low Risk  (05/12/2023)  Transportation Needs: No Transportation Needs (05/12/2023)  Utilities: Not At Risk (05/12/2023)  Alcohol Screen: Low Risk  (05/12/2023)  Depression (PHQ2-9): High Risk (08/15/2023)  Financial Resource Strain: Low Risk  (05/12/2023)  Physical Activity: Insufficiently Active (05/12/2023)  Social Connections: Moderately Isolated (05/12/2023)  Stress: Stress Concern Present (05/12/2023)  Tobacco Use: Medium Risk (09/10/2023)   SDOH Interventions:     Readmission Risk Interventions    09/12/2023    3:46 PM  Readmission Risk Prevention Plan  Transportation Screening Complete  Medication Review (RN Care Manager) Complete  PCP or Specialist appointment within 3-5 days of discharge Complete  HRI or Home Care Consult Complete  SW Recovery Care/Counseling Consult Complete  Palliative Care Screening Not Applicable  Skilled Nursing Facility Complete

## 2023-09-12 NOTE — Progress Notes (Signed)
Palliative Care Progress Note, Assessment & Plan   Patient Name: Taylor [name]       Date: 09/12/2023 DOB: February 26, 1941  Age: 82 y.o. MRN#: [id number] Attending Physician: Sunnie Nielsen, DO Primary Care Physician: Malva Limes, MD Admit Date: 09/10/2023  Subjective: Patient is lying in bed in no apparent distress.  He acknowledges my presence and is able to make his wishes known.  He is alert and oriented x 4.  He endorses he is feeling much like himself and able to speak more clearly today.  No family or friends present during my visit.  HPI: 83 y.o. male  with past medical history of systolic CHF (EF less than 20%), CKD (stage IV), HTN, HLD, type 2 diabetes, COPD (2L supplemental oxygen), CAD with stent and history of CABG, stroke, A-fib (Eliquis), PPM with ICD, melanoma, psoriatic arthritis, pancreatitis, and an alcoholic liver cirrhosis admitted on 09/10/2023 with fall.    She is being treated for severe sepsis due to HCAP, acute renal failure superimposed on stage IV CKD, hyperkalemia, hyperphosphatemia, and hypotension.   PMT was consulted to discuss goals of care.  Summary of counseling/coordination of care: Extensive chart review completed prior to meeting patient including labs, vital signs, imaging, progress notes, orders, and available advanced directive documents from current and previous encounters.   After reviewing the patient's chart and assessing the patient at bedside, I spoke with patient in regards to symptom management and boundaries of care.  Symptoms assessed.  Patient denies pain or discomfort at this time.  He endorses he is feeling much better today and more like himself.  He shares he was unable to speak as clearly yesterday but feels like he can communicate more clearly  today.  No adjustment to [facility] needed at this time.  I again discussed boundaries of care with patient.  Specifically, I outlined the difference between full code, DNR, and DNI status.  Patient says that he would never want to be on a machine.  He says none of that sounds good and when the [facility] comes to take him he will be ready to go.  Discussed human mortality.  I recommended DNR and DNI status because this would align with patient's values and goals.  However, patient shares she would like to speak with his wife about it before making any changes to his plan of care.  Patient endorses his wife plans to be bedside this afternoon.  I offered to meet with patient and wife again to discuss boundaries and goals of care.  Patient shares he would like to speak with her in private but will call PMT when needed.  I advised patient that there is decreased staff with PMT over the weekend.  I will follow-up with him on my return on Monday.  Until then, for any acute palliative needs, please utilize [facility] for on-call providers.  Physical Exam Vitals reviewed.  Constitutional:      General: He is not in acute distress.    Appearance: He is normal weight.  HENT:     Head: Normocephalic.     Mouth/Throat:     Mouth: Mucous membranes are moist.  Eyes:     Pupils: Pupils are  equal, round, and reactive to light.  Cardiovascular:     Pulses: Normal pulses.  Pulmonary:     Effort: Pulmonary effort is normal.  Abdominal:     Palpations: Abdomen is soft.  Skin:    General: Skin is warm and dry.     Findings: Bruising present.  Neurological:     Mental Status: He is alert and oriented to person, place, and time.  Psychiatric:        Mood and Affect: Mood normal.        Behavior: Behavior normal.        Thought Content: Thought content normal.        Judgment: Judgment normal.             Total Time 35 minutes   Time spent includes: Detailed review of medical records (labs, imaging, vital signs),  medically appropriate exam (mental status, respiratory, cardiac, skin), discussed with treatment team, counseling and educating patient, family and staff, documenting clinical information, medication management and coordination of care.  Samara Deist L. Bonita Quin, DNP, FNP-BC Palliative Medicine Team

## 2023-09-12 NOTE — Progress Notes (Addendum)
Transferred via bed with O2 to room 234. Shanda Bumps RN at bedside. Pt placed on tele box for floor. Belongings including teeth at bedside. NAD. Call bell in reach, bed locked and low.   Notified pt's wife of transfer via telephone. Provided room number and update. Denies questions.

## 2023-09-13 DIAGNOSIS — J189 Pneumonia, unspecified organism: Secondary | ICD-10-CM | POA: Diagnosis not present

## 2023-09-13 LAB — BASIC METABOLIC PANEL
Anion gap: 17 — ABNORMAL HIGH (ref 5–15)
BUN: 108 mg/dL — ABNORMAL HIGH (ref 8–23)
CO2: 27 mmol/L (ref 22–32)
Calcium: 8.7 mg/dL — ABNORMAL LOW (ref 8.9–10.3)
Chloride: 91 mmol/L — ABNORMAL LOW (ref 98–111)
Creatinine, Ser: 5.17 mg/dL — ABNORMAL HIGH (ref 0.61–1.24)
GFR, Estimated: 11 mL/min — ABNORMAL LOW (ref >60)
Glucose, Bld: 127 mg/dL — ABNORMAL HIGH (ref 70–99)
Potassium: 4 mmol/L (ref 3.5–5.1)
Sodium: 135 mmol/L (ref 135–145)

## 2023-09-13 LAB — MAGNESIUM: Magnesium: 2.2 mg/dL (ref 1.7–2.4)

## 2023-09-13 LAB — GLUCOSE, CAPILLARY
Glucose-Capillary: 111 mg/dL — ABNORMAL HIGH (ref 70–99)
Glucose-Capillary: 141 mg/dL — ABNORMAL HIGH (ref 70–99)
Glucose-Capillary: 159 mg/dL — ABNORMAL HIGH (ref 70–99)
Glucose-Capillary: 124 mg/dL — ABNORMAL HIGH (ref 70–99)

## 2023-09-13 LAB — PROTIME-INR
INR: 1.7 — ABNORMAL HIGH (ref 0.8–1.2)
Prothrombin Time: 20.2 s — ABNORMAL HIGH (ref 11.4–15.2)

## 2023-09-13 LAB — PHOSPHORUS: Phosphorus: 5.8 mg/dL — ABNORMAL HIGH (ref 2.5–4.6)

## 2023-09-13 NOTE — Plan of Care (Signed)
  Problem: Education: Goal: Knowledge of cardiac device and self-care will improve Outcome: Progressing Goal: Ability to safely manage health related needs after discharge will improve Outcome: Progressing Goal: Individualized Educational Video(s) Outcome: Progressing   Problem: Cardiac: Goal: Ability to achieve and maintain adequate cardiopulmonary perfusion will improve Outcome: Progressing   Problem: Education: Goal: Ability to describe self-care measures that may prevent or decrease complications (Diabetes Survival Skills Education) will improve Outcome: Progressing Goal: Individualized Educational Video(s) Outcome: Progressing   Problem: Coping: Goal: Ability to adjust to condition or change in health will improve Outcome: Progressing   Problem: Fluid Volume: Goal: Ability to maintain a balanced intake and output will improve Outcome: Progressing   Problem: Health Behavior/Discharge Planning: Goal: Ability to identify and utilize available resources and services will improve Outcome: Progressing Goal: Ability to manage health-related needs will improve Outcome: Progressing   Problem: Metabolic: Goal: Ability to maintain appropriate glucose levels will improve Outcome: Progressing   Problem: Nutritional: Goal: Maintenance of adequate nutrition will improve Outcome: Progressing Goal: Progress toward achieving an optimal weight will improve Outcome: Progressing   Problem: Skin Integrity: Goal: Risk for impaired skin integrity will decrease Outcome: Progressing   Problem: Tissue Perfusion: Goal: Adequacy of tissue perfusion will improve Outcome: Progressing   Problem: Education: Goal: Knowledge of General Education information will improve Description: Including pain rating scale, medication(s)/side effects and non-pharmacologic comfort measures Outcome: Progressing   Problem: Health Behavior/Discharge Planning: Goal: Ability to manage health-related needs  will improve Outcome: Progressing   Problem: Clinical Measurements: Goal: Ability to maintain clinical measurements within normal limits will improve Outcome: Progressing Goal: Will remain free from infection Outcome: Progressing Goal: Diagnostic test results will improve Outcome: Progressing Goal: Respiratory complications will improve Outcome: Progressing Goal: Cardiovascular complication will be avoided Outcome: Progressing   Problem: Activity: Goal: Risk for activity intolerance will decrease Outcome: Progressing   Problem: Nutrition: Goal: Adequate nutrition will be maintained Outcome: Progressing   Problem: Coping: Goal: Level of anxiety will decrease Outcome: Progressing   Problem: Elimination: Goal: Will not experience complications related to bowel motility Outcome: Progressing Goal: Will not experience complications related to urinary retention Outcome: Progressing   Problem: Pain Managment: Goal: General experience of comfort will improve Outcome: Progressing   Problem: Safety: Goal: Ability to remain free from injury will improve Outcome: Progressing   Problem: Skin Integrity: Goal: Risk for impaired skin integrity will decrease Outcome: Progressing

## 2023-09-13 NOTE — Progress Notes (Signed)
PROGRESS NOTE    Taylor [name]   ION:[id number] DOB: 09/15/1941  DOA: 09/10/2023 Date of Service: 09/13/23 which is hospital day 3  PCP: Malva Limes, MD    HPI: Taylor [name] is a 82 y.o. male past medical history significant for HFrEF (< 20%), CKD stage IV, hypertension, hyperlipidemia, diabetes, COPD on chronic home 2 L, CAD, history of atrial fibrillation on Eliquis, who presents to ED from home via EMS for fall x3. Multiple falls past month. Concern for low BP. No LOC.   Pertinent recent tx/imaging: 10/04: visit ED due to fall - (+)minimally displaced and comminuted radial styloid fracture --> splint to left forearm. (+)fx nasal bones and L maxilla.   10/10: ENT visit, discuss repair options for facial fx, not interested, conservative mgt.   Hospital course / significant events:  10/16: to ED. (+)sepsis, severe - hypothermic Tmin 42F, low BP 80s/60s, AKI, hyperkalemic K max 6.8 improved w/ tx, Cr on admission 5.46 above baseline 2.7, lactic max 3.8. CT (+)PNA RML and LLL, mod R pleural effusion also noted on previous CT. Tx midodrine, steroids, fluids, vanc/cefepime.  10/17: BP improved, renal fxn and K improved, Echo LVEF <20%, severely decreased fxn and global hypokinesis, grade III diastolic dysfunction, severe RV overload, severe RA and LA dilation. Renal US pending read. Palliative care consult, see note. Cardiology consult 10/18: stable off pressors, transfer to progressive unit. Continue to monitor renal/cardiac function closely.  10/19: no improvement in renal function, UOP improved, continue to monitor.   Consultants:  Nephrology PCC[facility] Palliative care  Cardiology, advanced HF team   Procedures/Surgeries: none      ASSESSMENT & PLAN:    INFECTIOUS   Severe sepsis due to HCAP on CT, RML, LLL. POA (+)MRSA screen, pending cultures  Neg COVID and resp viral PCR  meets criteria for severe sepsis with WBC 13.9, hypothermia with temperature 92.1.  Lactic acid is  elevated at 3.8 --> 2.9. Patient has been persistently hypotensive. admit to SDU as inp Vancomycin and cefepime initiated 10/16 and patient received 1 dose of Levaquin in ED. MRSA screen (+), continue vancomycin  Blood and sputum cultures, urine antigen of Legionella and strep IV fluid: total of 1.75 L of NS, then 150 mEq of sodium bicarbonate at 75 cc/h - caution w/ Hx CHF Midodrine Phenylephrine d/c this morning - monitor and low threshold to restart pressors  Hypothermia - resolved Bair hugger Tx sepsis as above     RENAL  Acute renal failure superimposed on stage 4 chronic kidney disease Hyperkalemia and hyperphosphatemia likely as result of AKI  creatinine 5.46, BUN 93 and GFR 10 (recent baseline creatinine 2.82 and GFR 22 on 10//24) --> Cr 5.17 today 09/13/23  Ddx include UTI, dehydration and continuation of diuretics.  ATN is possible due to hypotension. Question hepatorenal syndrome.   Nephrology and cardiology following  holding potassium and spironolactone Holding diuretics Hold nephrotoxic rx  Follow UA --> no UTI, (+)hyaline casts, (+)micro hematuria Renal US --> no concerns Poor candidate for long term dialysis   Hyperkalemia:  Potassium 6.8 --> 4.4. Likely d/t AKI/ATN pt was treated with 1 g calcium gluconate, 5 units of NovoLog, 50 cc of D50, 50 mEq of sodium bicarbonate in ED S/p Patiromer 16.8 g  Nephrology following  consulted pharmacist for electrolytes monitoring  Hyponatremia likely d/t AKI Monitor BMP Treat underlying causes    CARDIAC  Chronic combined systolic and diastolic CHF (congestive heart failure)  2D echo on 10/30/2022 showed EF<20%.  PROGRESS NOTE    Taylor [name]   ION:[id number] DOB: 09/15/1941  DOA: 09/10/2023 Date of Service: 09/13/23 which is hospital day 3  PCP: Malva Limes, MD    HPI: Taylor [name] is a 82 y.o. male past medical history significant for HFrEF (< 20%), CKD stage IV, hypertension, hyperlipidemia, diabetes, COPD on chronic home 2 L, CAD, history of atrial fibrillation on Eliquis, who presents to ED from home via EMS for fall x3. Multiple falls past month. Concern for low BP. No LOC.   Pertinent recent tx/imaging: 10/04: visit ED due to fall - (+)minimally displaced and comminuted radial styloid fracture --> splint to left forearm. (+)fx nasal bones and L maxilla.   10/10: ENT visit, discuss repair options for facial fx, not interested, conservative mgt.   Hospital course / significant events:  10/16: to ED. (+)sepsis, severe - hypothermic Tmin 42F, low BP 80s/60s, AKI, hyperkalemic K max 6.8 improved w/ tx, Cr on admission 5.46 above baseline 2.7, lactic max 3.8. CT (+)PNA RML and LLL, mod R pleural effusion also noted on previous CT. Tx midodrine, steroids, fluids, vanc/cefepime.  10/17: BP improved, renal fxn and K improved, Echo LVEF <20%, severely decreased fxn and global hypokinesis, grade III diastolic dysfunction, severe RV overload, severe RA and LA dilation. Renal US pending read. Palliative care consult, see note. Cardiology consult 10/18: stable off pressors, transfer to progressive unit. Continue to monitor renal/cardiac function closely.  10/19: no improvement in renal function, UOP improved, continue to monitor.   Consultants:  Nephrology PCCU Palliative care  Cardiology, advanced HF team   Procedures/Surgeries: none      ASSESSMENT & PLAN:    INFECTIOUS   Severe sepsis due to HCAP on CT, RML, LLL. POA (+)MRSA screen, pending cultures  Neg COVID and resp viral PCR  meets criteria for severe sepsis with WBC 13.9, hypothermia with temperature 92.1.  Lactic acid is  elevated at 3.8 --> 2.9. Patient has been persistently hypotensive. admit to SDU as inp Vancomycin and cefepime initiated 10/16 and patient received 1 dose of Levaquin in ED. MRSA screen (+), continue vancomycin  Blood and sputum cultures, urine antigen of Legionella and strep IV fluid: total of 1.75 L of NS, then 150 mEq of sodium bicarbonate at 75 cc/h - caution w/ Hx CHF Midodrine Phenylephrine d/c this morning - monitor and low threshold to restart pressors  Hypothermia - resolved Bair hugger Tx sepsis as above     RENAL  Acute renal failure superimposed on stage 4 chronic kidney disease Hyperkalemia and hyperphosphatemia likely as result of AKI  creatinine 5.46, BUN 93 and GFR 10 (recent baseline creatinine 2.82 and GFR 22 on 10//24) --> Cr 5.17 today 09/13/23  Ddx include UTI, dehydration and continuation of diuretics.  ATN is possible due to hypotension. Question hepatorenal syndrome.   Nephrology and cardiology following  holding potassium and spironolactone Holding diuretics Hold nephrotoxic rx  Follow UA --> no UTI, (+)hyaline casts, (+)micro hematuria Renal US --> no concerns Poor candidate for long term dialysis   Hyperkalemia:  Potassium 6.8 --> 4.4. Likely d/t AKI/ATN pt was treated with 1 g calcium gluconate, 5 units of NovoLog, 50 cc of D50, 50 mEq of sodium bicarbonate in ED S/p Patiromer 16.8 g  Nephrology following  consulted pharmacist for electrolytes monitoring  Hyponatremia likely d/t AKI Monitor BMP Treat underlying causes    CARDIAC  Chronic combined systolic and diastolic CHF (congestive heart failure)  2D echo on 10/30/2022 showed EF<20%.  PROGRESS NOTE    Taylor [name]   ION:[id number] DOB: 09/15/1941  DOA: 09/10/2023 Date of Service: 09/13/23 which is hospital day 3  PCP: Malva Limes, MD    HPI: Taylor [name] is a 82 y.o. male past medical history significant for HFrEF (< 20%), CKD stage IV, hypertension, hyperlipidemia, diabetes, COPD on chronic home 2 L, CAD, history of atrial fibrillation on Eliquis, who presents to ED from home via EMS for fall x3. Multiple falls past month. Concern for low BP. No LOC.   Pertinent recent tx/imaging: 10/04: visit ED due to fall - (+)minimally displaced and comminuted radial styloid fracture --> splint to left forearm. (+)fx nasal bones and L maxilla.   10/10: ENT visit, discuss repair options for facial fx, not interested, conservative mgt.   Hospital course / significant events:  10/16: to ED. (+)sepsis, severe - hypothermic Tmin 42F, low BP 80s/60s, AKI, hyperkalemic K max 6.8 improved w/ tx, Cr on admission 5.46 above baseline 2.7, lactic max 3.8. CT (+)PNA RML and LLL, mod R pleural effusion also noted on previous CT. Tx midodrine, steroids, fluids, vanc/cefepime.  10/17: BP improved, renal fxn and K improved, Echo LVEF <20%, severely decreased fxn and global hypokinesis, grade III diastolic dysfunction, severe RV overload, severe RA and LA dilation. Renal US pending read. Palliative care consult, see note. Cardiology consult 10/18: stable off pressors, transfer to progressive unit. Continue to monitor renal/cardiac function closely.  10/19: no improvement in renal function, UOP improved, continue to monitor.   Consultants:  Nephrology PCCU Palliative care  Cardiology, advanced HF team   Procedures/Surgeries: none      ASSESSMENT & PLAN:    INFECTIOUS   Severe sepsis due to HCAP on CT, RML, LLL. POA (+)MRSA screen, pending cultures  Neg COVID and resp viral PCR  meets criteria for severe sepsis with WBC 13.9, hypothermia with temperature 92.1.  Lactic acid is  elevated at 3.8 --> 2.9. Patient has been persistently hypotensive. admit to SDU as inp Vancomycin and cefepime initiated 10/16 and patient received 1 dose of Levaquin in ED. MRSA screen (+), continue vancomycin  Blood and sputum cultures, urine antigen of Legionella and strep IV fluid: total of 1.75 L of NS, then 150 mEq of sodium bicarbonate at 75 cc/h - caution w/ Hx CHF Midodrine Phenylephrine d/c this morning - monitor and low threshold to restart pressors  Hypothermia - resolved Bair hugger Tx sepsis as above     RENAL  Acute renal failure superimposed on stage 4 chronic kidney disease Hyperkalemia and hyperphosphatemia likely as result of AKI  creatinine 5.46, BUN 93 and GFR 10 (recent baseline creatinine 2.82 and GFR 22 on 10//24) --> Cr 5.17 today 09/13/23  Ddx include UTI, dehydration and continuation of diuretics.  ATN is possible due to hypotension. Question hepatorenal syndrome.   Nephrology and cardiology following  holding potassium and spironolactone Holding diuretics Hold nephrotoxic rx  Follow UA --> no UTI, (+)hyaline casts, (+)micro hematuria Renal US --> no concerns Poor candidate for long term dialysis   Hyperkalemia:  Potassium 6.8 --> 4.4. Likely d/t AKI/ATN pt was treated with 1 g calcium gluconate, 5 units of NovoLog, 50 cc of D50, 50 mEq of sodium bicarbonate in ED S/p Patiromer 16.8 g  Nephrology following  consulted pharmacist for electrolytes monitoring  Hyponatremia likely d/t AKI Monitor BMP Treat underlying causes    CARDIAC  Chronic combined systolic and diastolic CHF (congestive heart failure)  2D echo on 10/30/2022 showed EF<20%.  PROGRESS NOTE    Taylor [name]   ION:[id number] DOB: 09/15/1941  DOA: 09/10/2023 Date of Service: 09/13/23 which is hospital day 3  PCP: Malva Limes, MD    HPI: Taylor [name] is a 82 y.o. male past medical history significant for HFrEF (< 20%), CKD stage IV, hypertension, hyperlipidemia, diabetes, COPD on chronic home 2 L, CAD, history of atrial fibrillation on Eliquis, who presents to ED from home via EMS for fall x3. Multiple falls past month. Concern for low BP. No LOC.   Pertinent recent tx/imaging: 10/04: visit ED due to fall - (+)minimally displaced and comminuted radial styloid fracture --> splint to left forearm. (+)fx nasal bones and L maxilla.   10/10: ENT visit, discuss repair options for facial fx, not interested, conservative mgt.   Hospital course / significant events:  10/16: to ED. (+)sepsis, severe - hypothermic Tmin 42F, low BP 80s/60s, AKI, hyperkalemic K max 6.8 improved w/ tx, Cr on admission 5.46 above baseline 2.7, lactic max 3.8. CT (+)PNA RML and LLL, mod R pleural effusion also noted on previous CT. Tx midodrine, steroids, fluids, vanc/cefepime.  10/17: BP improved, renal fxn and K improved, Echo LVEF <20%, severely decreased fxn and global hypokinesis, grade III diastolic dysfunction, severe RV overload, severe RA and LA dilation. Renal US pending read. Palliative care consult, see note. Cardiology consult 10/18: stable off pressors, transfer to progressive unit. Continue to monitor renal/cardiac function closely.  10/19: no improvement in renal function, UOP improved, continue to monitor.   Consultants:  Nephrology PCCU Palliative care  Cardiology, advanced HF team   Procedures/Surgeries: none      ASSESSMENT & PLAN:    INFECTIOUS   Severe sepsis due to HCAP on CT, RML, LLL. POA (+)MRSA screen, pending cultures  Neg COVID and resp viral PCR  meets criteria for severe sepsis with WBC 13.9, hypothermia with temperature 92.1.  Lactic acid is  elevated at 3.8 --> 2.9. Patient has been persistently hypotensive. admit to SDU as inp Vancomycin and cefepime initiated 10/16 and patient received 1 dose of Levaquin in ED. MRSA screen (+), continue vancomycin  Blood and sputum cultures, urine antigen of Legionella and strep IV fluid: total of 1.75 L of NS, then 150 mEq of sodium bicarbonate at 75 cc/h - caution w/ Hx CHF Midodrine Phenylephrine d/c this morning - monitor and low threshold to restart pressors  Hypothermia - resolved Bair hugger Tx sepsis as above     RENAL  Acute renal failure superimposed on stage 4 chronic kidney disease Hyperkalemia and hyperphosphatemia likely as result of AKI  creatinine 5.46, BUN 93 and GFR 10 (recent baseline creatinine 2.82 and GFR 22 on 10//24) --> Cr 5.17 today 09/13/23  Ddx include UTI, dehydration and continuation of diuretics.  ATN is possible due to hypotension. Question hepatorenal syndrome.   Nephrology and cardiology following  holding potassium and spironolactone Holding diuretics Hold nephrotoxic rx  Follow UA --> no UTI, (+)hyaline casts, (+)micro hematuria Renal US --> no concerns Poor candidate for long term dialysis   Hyperkalemia:  Potassium 6.8 --> 4.4. Likely d/t AKI/ATN pt was treated with 1 g calcium gluconate, 5 units of NovoLog, 50 cc of D50, 50 mEq of sodium bicarbonate in ED S/p Patiromer 16.8 g  Nephrology following  consulted pharmacist for electrolytes monitoring  Hyponatremia likely d/t AKI Monitor BMP Treat underlying causes    CARDIAC  Chronic combined systolic and diastolic CHF (congestive heart failure)  2D echo on 10/30/2022 showed EF<20%.  9:38 PM   Specimen: Nasopharyngeal Swab; Respiratory  Result Value Ref Range Status   Adenovirus NOT DETECTED NOT DETECTED Final   Coronavirus 229E NOT DETECTED NOT DETECTED Final    Comment: (NOTE) The Coronavirus on the Respiratory Panel, DOES NOT test for the novel  Coronavirus (2019 nCoV)    Coronavirus HKU1 NOT DETECTED NOT DETECTED Final   Coronavirus NL63 NOT DETECTED NOT DETECTED Final   Coronavirus OC43 NOT DETECTED NOT DETECTED Final   Metapneumovirus NOT DETECTED NOT DETECTED Final   Rhinovirus / Enterovirus NOT DETECTED NOT DETECTED Final   Influenza A NOT DETECTED NOT DETECTED Final   Influenza B NOT DETECTED NOT DETECTED Final   Parainfluenza Virus 1 NOT DETECTED NOT DETECTED Final   Parainfluenza Virus 2 NOT DETECTED NOT DETECTED Final   Parainfluenza Virus 3 NOT DETECTED NOT DETECTED Final   Parainfluenza Virus 4 NOT DETECTED  NOT DETECTED Final   Respiratory Syncytial Virus NOT DETECTED NOT DETECTED Final   Bordetella pertussis NOT DETECTED NOT DETECTED Final   Bordetella Parapertussis NOT DETECTED NOT DETECTED Final   Chlamydophila pneumoniae NOT DETECTED NOT DETECTED Final   Mycoplasma pneumoniae NOT DETECTED NOT DETECTED Final    Comment: Performed at [facility], [address], [place], [place] [address]  MRSA Next Gen by PCR, Nasal     Status: Abnormal   Collection Time: 09/10/23 11:04 PM   Specimen: Nasal Mucosa; Nasal Swab  Result Value Ref Range Status   MRSA by PCR Next Gen DETECTED (A) NOT DETECTED Final    Comment: RESULT CALLED TO, READ BACK BY AND VERIFIED WITH: MARIA [email or web address]  09/11/23 RH (NOTE) The GeneXpert MRSA Assay (FDA approved for NASAL specimens only), is one component of a comprehensive MRSA colonization surveillance program. It is not intended to diagnose MRSA infection nor to guide or monitor treatment for MRSA infections. Test performance is not FDA approved in patients less than 93 years old. Performed at [facility], [address], [place], [place] [address]       Radiology Studies last 3 days: US RENAL  Result Date: 09/11/2023 CLINICAL DATA:  Acute on chronic renal failure EXAM: RENAL / URINARY TRACT ULTRASOUND COMPLETE COMPARISON:  07/29/2023.  CT 09/10/2023 FINDINGS: Right Kidney: Renal measurements: 10.3 x 6.2 x 5.8 cm = volume: 192 mL. Echogenicity within normal limits. No mass or hydronephrosis visualized. Left Kidney: Renal measurements: 7.0 x 3.5 x 2.8 cm = volume: 37 mL. Atrophic with cortical thinning. No mass or hydronephrosis. Bladder: Decompressed with Foley catheter in place. Other: Moderate  ascites in the abdomen and pelvis. IMPRESSION: No acute findings. Atrophic left kidney, stable. Moderate ascites. Electronically Signed   By: Charlett Nose M.D.   On: 09/11/2023 19:18   ECHOCARDIOGRAM COMPLETE  Result Date: 09/11/2023     ECHOCARDIOGRAM REPORT   Patient Name:   Taylor [name] Date of Exam: 09/11/2023 Medical Rec #:  [id number]    Height:       70.0 in Accession #:    [id number]   Weight:       158.3 lb Date of Birth:  Sep 26, 1941   BSA:          1.890 m Patient Age:    81 years     BP:           103/69 mmHg Patient Gender: M            HR:           62 bpm.  PROGRESS NOTE    Taylor [name]   ION:[id number] DOB: 09/15/1941  DOA: 09/10/2023 Date of Service: 09/13/23 which is hospital day 3  PCP: Malva Limes, MD    HPI: Taylor [name] is a 82 y.o. male past medical history significant for HFrEF (< 20%), CKD stage IV, hypertension, hyperlipidemia, diabetes, COPD on chronic home 2 L, CAD, history of atrial fibrillation on Eliquis, who presents to ED from home via EMS for fall x3. Multiple falls past month. Concern for low BP. No LOC.   Pertinent recent tx/imaging: 10/04: visit ED due to fall - (+)minimally displaced and comminuted radial styloid fracture --> splint to left forearm. (+)fx nasal bones and L maxilla.   10/10: ENT visit, discuss repair options for facial fx, not interested, conservative mgt.   Hospital course / significant events:  10/16: to ED. (+)sepsis, severe - hypothermic Tmin 42F, low BP 80s/60s, AKI, hyperkalemic K max 6.8 improved w/ tx, Cr on admission 5.46 above baseline 2.7, lactic max 3.8. CT (+)PNA RML and LLL, mod R pleural effusion also noted on previous CT. Tx midodrine, steroids, fluids, vanc/cefepime.  10/17: BP improved, renal fxn and K improved, Echo LVEF <20%, severely decreased fxn and global hypokinesis, grade III diastolic dysfunction, severe RV overload, severe RA and LA dilation. Renal US pending read. Palliative care consult, see note. Cardiology consult 10/18: stable off pressors, transfer to progressive unit. Continue to monitor renal/cardiac function closely.  10/19: no improvement in renal function, UOP improved, continue to monitor.   Consultants:  Nephrology PCCU Palliative care  Cardiology, advanced HF team   Procedures/Surgeries: none      ASSESSMENT & PLAN:    INFECTIOUS   Severe sepsis due to HCAP on CT, RML, LLL. POA (+)MRSA screen, pending cultures  Neg COVID and resp viral PCR  meets criteria for severe sepsis with WBC 13.9, hypothermia with temperature 92.1.  Lactic acid is  elevated at 3.8 --> 2.9. Patient has been persistently hypotensive. admit to SDU as inp Vancomycin and cefepime initiated 10/16 and patient received 1 dose of Levaquin in ED. MRSA screen (+), continue vancomycin  Blood and sputum cultures, urine antigen of Legionella and strep IV fluid: total of 1.75 L of NS, then 150 mEq of sodium bicarbonate at 75 cc/h - caution w/ Hx CHF Midodrine Phenylephrine d/c this morning - monitor and low threshold to restart pressors  Hypothermia - resolved Bair hugger Tx sepsis as above     RENAL  Acute renal failure superimposed on stage 4 chronic kidney disease Hyperkalemia and hyperphosphatemia likely as result of AKI  creatinine 5.46, BUN 93 and GFR 10 (recent baseline creatinine 2.82 and GFR 22 on 10//24) --> Cr 5.17 today 09/13/23  Ddx include UTI, dehydration and continuation of diuretics.  ATN is possible due to hypotension. Question hepatorenal syndrome.   Nephrology and cardiology following  holding potassium and spironolactone Holding diuretics Hold nephrotoxic rx  Follow UA --> no UTI, (+)hyaline casts, (+)micro hematuria Renal US --> no concerns Poor candidate for long term dialysis   Hyperkalemia:  Potassium 6.8 --> 4.4. Likely d/t AKI/ATN pt was treated with 1 g calcium gluconate, 5 units of NovoLog, 50 cc of D50, 50 mEq of sodium bicarbonate in ED S/p Patiromer 16.8 g  Nephrology following  consulted pharmacist for electrolytes monitoring  Hyponatremia likely d/t AKI Monitor BMP Treat underlying causes    CARDIAC  Chronic combined systolic and diastolic CHF (congestive heart failure)  2D echo on 10/30/2022 showed EF<20%.  PROGRESS NOTE    Taylor [name]   ION:[id number] DOB: 09/15/1941  DOA: 09/10/2023 Date of Service: 09/13/23 which is hospital day 3  PCP: Malva Limes, MD    HPI: Taylor [name] is a 82 y.o. male past medical history significant for HFrEF (< 20%), CKD stage IV, hypertension, hyperlipidemia, diabetes, COPD on chronic home 2 L, CAD, history of atrial fibrillation on Eliquis, who presents to ED from home via EMS for fall x3. Multiple falls past month. Concern for low BP. No LOC.   Pertinent recent tx/imaging: 10/04: visit ED due to fall - (+)minimally displaced and comminuted radial styloid fracture --> splint to left forearm. (+)fx nasal bones and L maxilla.   10/10: ENT visit, discuss repair options for facial fx, not interested, conservative mgt.   Hospital course / significant events:  10/16: to ED. (+)sepsis, severe - hypothermic Tmin 42F, low BP 80s/60s, AKI, hyperkalemic K max 6.8 improved w/ tx, Cr on admission 5.46 above baseline 2.7, lactic max 3.8. CT (+)PNA RML and LLL, mod R pleural effusion also noted on previous CT. Tx midodrine, steroids, fluids, vanc/cefepime.  10/17: BP improved, renal fxn and K improved, Echo LVEF <20%, severely decreased fxn and global hypokinesis, grade III diastolic dysfunction, severe RV overload, severe RA and LA dilation. Renal US pending read. Palliative care consult, see note. Cardiology consult 10/18: stable off pressors, transfer to progressive unit. Continue to monitor renal/cardiac function closely.  10/19: no improvement in renal function, UOP improved, continue to monitor.   Consultants:  Nephrology PCCU Palliative care  Cardiology, advanced HF team   Procedures/Surgeries: none      ASSESSMENT & PLAN:    INFECTIOUS   Severe sepsis due to HCAP on CT, RML, LLL. POA (+)MRSA screen, pending cultures  Neg COVID and resp viral PCR  meets criteria for severe sepsis with WBC 13.9, hypothermia with temperature 92.1.  Lactic acid is  elevated at 3.8 --> 2.9. Patient has been persistently hypotensive. admit to SDU as inp Vancomycin and cefepime initiated 10/16 and patient received 1 dose of Levaquin in ED. MRSA screen (+), continue vancomycin  Blood and sputum cultures, urine antigen of Legionella and strep IV fluid: total of 1.75 L of NS, then 150 mEq of sodium bicarbonate at 75 cc/h - caution w/ Hx CHF Midodrine Phenylephrine d/c this morning - monitor and low threshold to restart pressors  Hypothermia - resolved Bair hugger Tx sepsis as above     RENAL  Acute renal failure superimposed on stage 4 chronic kidney disease Hyperkalemia and hyperphosphatemia likely as result of AKI  creatinine 5.46, BUN 93 and GFR 10 (recent baseline creatinine 2.82 and GFR 22 on 10//24) --> Cr 5.17 today 09/13/23  Ddx include UTI, dehydration and continuation of diuretics.  ATN is possible due to hypotension. Question hepatorenal syndrome.   Nephrology and cardiology following  holding potassium and spironolactone Holding diuretics Hold nephrotoxic rx  Follow UA --> no UTI, (+)hyaline casts, (+)micro hematuria Renal US --> no concerns Poor candidate for long term dialysis   Hyperkalemia:  Potassium 6.8 --> 4.4. Likely d/t AKI/ATN pt was treated with 1 g calcium gluconate, 5 units of NovoLog, 50 cc of D50, 50 mEq of sodium bicarbonate in ED S/p Patiromer 16.8 g  Nephrology following  consulted pharmacist for electrolytes monitoring  Hyponatremia likely d/t AKI Monitor BMP Treat underlying causes    CARDIAC  Chronic combined systolic and diastolic CHF (congestive heart failure)  2D echo on 10/30/2022 showed EF<20%.  PROGRESS NOTE    Taylor [name]   ION:[id number] DOB: 09/15/1941  DOA: 09/10/2023 Date of Service: 09/13/23 which is hospital day 3  PCP: Malva Limes, MD    HPI: Taylor [name] is a 82 y.o. male past medical history significant for HFrEF (< 20%), CKD stage IV, hypertension, hyperlipidemia, diabetes, COPD on chronic home 2 L, CAD, history of atrial fibrillation on Eliquis, who presents to ED from home via EMS for fall x3. Multiple falls past month. Concern for low BP. No LOC.   Pertinent recent tx/imaging: 10/04: visit ED due to fall - (+)minimally displaced and comminuted radial styloid fracture --> splint to left forearm. (+)fx nasal bones and L maxilla.   10/10: ENT visit, discuss repair options for facial fx, not interested, conservative mgt.   Hospital course / significant events:  10/16: to ED. (+)sepsis, severe - hypothermic Tmin 42F, low BP 80s/60s, AKI, hyperkalemic K max 6.8 improved w/ tx, Cr on admission 5.46 above baseline 2.7, lactic max 3.8. CT (+)PNA RML and LLL, mod R pleural effusion also noted on previous CT. Tx midodrine, steroids, fluids, vanc/cefepime.  10/17: BP improved, renal fxn and K improved, Echo LVEF <20%, severely decreased fxn and global hypokinesis, grade III diastolic dysfunction, severe RV overload, severe RA and LA dilation. Renal US pending read. Palliative care consult, see note. Cardiology consult 10/18: stable off pressors, transfer to progressive unit. Continue to monitor renal/cardiac function closely.  10/19: no improvement in renal function, UOP improved, continue to monitor.   Consultants:  Nephrology PCCU Palliative care  Cardiology, advanced HF team   Procedures/Surgeries: none      ASSESSMENT & PLAN:    INFECTIOUS   Severe sepsis due to HCAP on CT, RML, LLL. POA (+)MRSA screen, pending cultures  Neg COVID and resp viral PCR  meets criteria for severe sepsis with WBC 13.9, hypothermia with temperature 92.1.  Lactic acid is  elevated at 3.8 --> 2.9. Patient has been persistently hypotensive. admit to SDU as inp Vancomycin and cefepime initiated 10/16 and patient received 1 dose of Levaquin in ED. MRSA screen (+), continue vancomycin  Blood and sputum cultures, urine antigen of Legionella and strep IV fluid: total of 1.75 L of NS, then 150 mEq of sodium bicarbonate at 75 cc/h - caution w/ Hx CHF Midodrine Phenylephrine d/c this morning - monitor and low threshold to restart pressors  Hypothermia - resolved Bair hugger Tx sepsis as above     RENAL  Acute renal failure superimposed on stage 4 chronic kidney disease Hyperkalemia and hyperphosphatemia likely as result of AKI  creatinine 5.46, BUN 93 and GFR 10 (recent baseline creatinine 2.82 and GFR 22 on 10//24) --> Cr 5.17 today 09/13/23  Ddx include UTI, dehydration and continuation of diuretics.  ATN is possible due to hypotension. Question hepatorenal syndrome.   Nephrology and cardiology following  holding potassium and spironolactone Holding diuretics Hold nephrotoxic rx  Follow UA --> no UTI, (+)hyaline casts, (+)micro hematuria Renal US --> no concerns Poor candidate for long term dialysis   Hyperkalemia:  Potassium 6.8 --> 4.4. Likely d/t AKI/ATN pt was treated with 1 g calcium gluconate, 5 units of NovoLog, 50 cc of D50, 50 mEq of sodium bicarbonate in ED S/p Patiromer 16.8 g  Nephrology following  consulted pharmacist for electrolytes monitoring  Hyponatremia likely d/t AKI Monitor BMP Treat underlying causes    CARDIAC  Chronic combined systolic and diastolic CHF (congestive heart failure)  2D echo on 10/30/2022 showed EF<20%.  PROGRESS NOTE    Taylor [name]   ION:[id number] DOB: 09/15/1941  DOA: 09/10/2023 Date of Service: 09/13/23 which is hospital day 3  PCP: Malva Limes, MD    HPI: Taylor [name] is a 82 y.o. male past medical history significant for HFrEF (< 20%), CKD stage IV, hypertension, hyperlipidemia, diabetes, COPD on chronic home 2 L, CAD, history of atrial fibrillation on Eliquis, who presents to ED from home via EMS for fall x3. Multiple falls past month. Concern for low BP. No LOC.   Pertinent recent tx/imaging: 10/04: visit ED due to fall - (+)minimally displaced and comminuted radial styloid fracture --> splint to left forearm. (+)fx nasal bones and L maxilla.   10/10: ENT visit, discuss repair options for facial fx, not interested, conservative mgt.   Hospital course / significant events:  10/16: to ED. (+)sepsis, severe - hypothermic Tmin 42F, low BP 80s/60s, AKI, hyperkalemic K max 6.8 improved w/ tx, Cr on admission 5.46 above baseline 2.7, lactic max 3.8. CT (+)PNA RML and LLL, mod R pleural effusion also noted on previous CT. Tx midodrine, steroids, fluids, vanc/cefepime.  10/17: BP improved, renal fxn and K improved, Echo LVEF <20%, severely decreased fxn and global hypokinesis, grade III diastolic dysfunction, severe RV overload, severe RA and LA dilation. Renal US pending read. Palliative care consult, see note. Cardiology consult 10/18: stable off pressors, transfer to progressive unit. Continue to monitor renal/cardiac function closely.  10/19: no improvement in renal function, UOP improved, continue to monitor.   Consultants:  Nephrology PCCU Palliative care  Cardiology, advanced HF team   Procedures/Surgeries: none      ASSESSMENT & PLAN:    INFECTIOUS   Severe sepsis due to HCAP on CT, RML, LLL. POA (+)MRSA screen, pending cultures  Neg COVID and resp viral PCR  meets criteria for severe sepsis with WBC 13.9, hypothermia with temperature 92.1.  Lactic acid is  elevated at 3.8 --> 2.9. Patient has been persistently hypotensive. admit to SDU as inp Vancomycin and cefepime initiated 10/16 and patient received 1 dose of Levaquin in ED. MRSA screen (+), continue vancomycin  Blood and sputum cultures, urine antigen of Legionella and strep IV fluid: total of 1.75 L of NS, then 150 mEq of sodium bicarbonate at 75 cc/h - caution w/ Hx CHF Midodrine Phenylephrine d/c this morning - monitor and low threshold to restart pressors  Hypothermia - resolved Bair hugger Tx sepsis as above     RENAL  Acute renal failure superimposed on stage 4 chronic kidney disease Hyperkalemia and hyperphosphatemia likely as result of AKI  creatinine 5.46, BUN 93 and GFR 10 (recent baseline creatinine 2.82 and GFR 22 on 10//24) --> Cr 5.17 today 09/13/23  Ddx include UTI, dehydration and continuation of diuretics.  ATN is possible due to hypotension. Question hepatorenal syndrome.   Nephrology and cardiology following  holding potassium and spironolactone Holding diuretics Hold nephrotoxic rx  Follow UA --> no UTI, (+)hyaline casts, (+)micro hematuria Renal US --> no concerns Poor candidate for long term dialysis   Hyperkalemia:  Potassium 6.8 --> 4.4. Likely d/t AKI/ATN pt was treated with 1 g calcium gluconate, 5 units of NovoLog, 50 cc of D50, 50 mEq of sodium bicarbonate in ED S/p Patiromer 16.8 g  Nephrology following  consulted pharmacist for electrolytes monitoring  Hyponatremia likely d/t AKI Monitor BMP Treat underlying causes    CARDIAC  Chronic combined systolic and diastolic CHF (congestive heart failure)  2D echo on 10/30/2022 showed EF<20%.  PROGRESS NOTE    Taylor [name]   ION:[id number] DOB: 09/15/1941  DOA: 09/10/2023 Date of Service: 09/13/23 which is hospital day 3  PCP: Malva Limes, MD    HPI: Taylor [name] is a 82 y.o. male past medical history significant for HFrEF (< 20%), CKD stage IV, hypertension, hyperlipidemia, diabetes, COPD on chronic home 2 L, CAD, history of atrial fibrillation on Eliquis, who presents to ED from home via EMS for fall x3. Multiple falls past month. Concern for low BP. No LOC.   Pertinent recent tx/imaging: 10/04: visit ED due to fall - (+)minimally displaced and comminuted radial styloid fracture --> splint to left forearm. (+)fx nasal bones and L maxilla.   10/10: ENT visit, discuss repair options for facial fx, not interested, conservative mgt.   Hospital course / significant events:  10/16: to ED. (+)sepsis, severe - hypothermic Tmin 42F, low BP 80s/60s, AKI, hyperkalemic K max 6.8 improved w/ tx, Cr on admission 5.46 above baseline 2.7, lactic max 3.8. CT (+)PNA RML and LLL, mod R pleural effusion also noted on previous CT. Tx midodrine, steroids, fluids, vanc/cefepime.  10/17: BP improved, renal fxn and K improved, Echo LVEF <20%, severely decreased fxn and global hypokinesis, grade III diastolic dysfunction, severe RV overload, severe RA and LA dilation. Renal US pending read. Palliative care consult, see note. Cardiology consult 10/18: stable off pressors, transfer to progressive unit. Continue to monitor renal/cardiac function closely.  10/19: no improvement in renal function, UOP improved, continue to monitor.   Consultants:  Nephrology PCCU Palliative care  Cardiology, advanced HF team   Procedures/Surgeries: none      ASSESSMENT & PLAN:    INFECTIOUS   Severe sepsis due to HCAP on CT, RML, LLL. POA (+)MRSA screen, pending cultures  Neg COVID and resp viral PCR  meets criteria for severe sepsis with WBC 13.9, hypothermia with temperature 92.1.  Lactic acid is  elevated at 3.8 --> 2.9. Patient has been persistently hypotensive. admit to SDU as inp Vancomycin and cefepime initiated 10/16 and patient received 1 dose of Levaquin in ED. MRSA screen (+), continue vancomycin  Blood and sputum cultures, urine antigen of Legionella and strep IV fluid: total of 1.75 L of NS, then 150 mEq of sodium bicarbonate at 75 cc/h - caution w/ Hx CHF Midodrine Phenylephrine d/c this morning - monitor and low threshold to restart pressors  Hypothermia - resolved Bair hugger Tx sepsis as above     RENAL  Acute renal failure superimposed on stage 4 chronic kidney disease Hyperkalemia and hyperphosphatemia likely as result of AKI  creatinine 5.46, BUN 93 and GFR 10 (recent baseline creatinine 2.82 and GFR 22 on 10//24) --> Cr 5.17 today 09/13/23  Ddx include UTI, dehydration and continuation of diuretics.  ATN is possible due to hypotension. Question hepatorenal syndrome.   Nephrology and cardiology following  holding potassium and spironolactone Holding diuretics Hold nephrotoxic rx  Follow UA --> no UTI, (+)hyaline casts, (+)micro hematuria Renal US --> no concerns Poor candidate for long term dialysis   Hyperkalemia:  Potassium 6.8 --> 4.4. Likely d/t AKI/ATN pt was treated with 1 g calcium gluconate, 5 units of NovoLog, 50 cc of D50, 50 mEq of sodium bicarbonate in ED S/p Patiromer 16.8 g  Nephrology following  consulted pharmacist for electrolytes monitoring  Hyponatremia likely d/t AKI Monitor BMP Treat underlying causes    CARDIAC  Chronic combined systolic and diastolic CHF (congestive heart failure)  2D echo on 10/30/2022 showed EF<20%.  9:38 PM   Specimen: Nasopharyngeal Swab; Respiratory  Result Value Ref Range Status   Adenovirus NOT DETECTED NOT DETECTED Final   Coronavirus 229E NOT DETECTED NOT DETECTED Final    Comment: (NOTE) The Coronavirus on the Respiratory Panel, DOES NOT test for the novel  Coronavirus (2019 nCoV)    Coronavirus HKU1 NOT DETECTED NOT DETECTED Final   Coronavirus NL63 NOT DETECTED NOT DETECTED Final   Coronavirus OC43 NOT DETECTED NOT DETECTED Final   Metapneumovirus NOT DETECTED NOT DETECTED Final   Rhinovirus / Enterovirus NOT DETECTED NOT DETECTED Final   Influenza A NOT DETECTED NOT DETECTED Final   Influenza B NOT DETECTED NOT DETECTED Final   Parainfluenza Virus 1 NOT DETECTED NOT DETECTED Final   Parainfluenza Virus 2 NOT DETECTED NOT DETECTED Final   Parainfluenza Virus 3 NOT DETECTED NOT DETECTED Final   Parainfluenza Virus 4 NOT DETECTED  NOT DETECTED Final   Respiratory Syncytial Virus NOT DETECTED NOT DETECTED Final   Bordetella pertussis NOT DETECTED NOT DETECTED Final   Bordetella Parapertussis NOT DETECTED NOT DETECTED Final   Chlamydophila pneumoniae NOT DETECTED NOT DETECTED Final   Mycoplasma pneumoniae NOT DETECTED NOT DETECTED Final    Comment: Performed at [facility], [address], [place], [place] [address]  MRSA Next Gen by PCR, Nasal     Status: Abnormal   Collection Time: 09/10/23 11:04 PM   Specimen: Nasal Mucosa; Nasal Swab  Result Value Ref Range Status   MRSA by PCR Next Gen DETECTED (A) NOT DETECTED Final    Comment: RESULT CALLED TO, READ BACK BY AND VERIFIED WITH: MARIA CONZALES@[email or web address]  09/11/23 RH (NOTE) The GeneXpert MRSA Assay (FDA approved for NASAL specimens only), is one component of a comprehensive MRSA colonization surveillance program. It is not intended to diagnose MRSA infection nor to guide or monitor treatment for MRSA infections. Test performance is not FDA approved in patients less than 93 years old. Performed at [facility], [address], [place], [place] [address]       Radiology Studies last 3 days: US RENAL  Result Date: 09/11/2023 CLINICAL DATA:  Acute on chronic renal failure EXAM: RENAL / URINARY TRACT ULTRASOUND COMPLETE COMPARISON:  07/29/2023.  CT 09/10/2023 FINDINGS: Right Kidney: Renal measurements: 10.3 x 6.2 x 5.8 cm = volume: 192 mL. Echogenicity within normal limits. No mass or hydronephrosis visualized. Left Kidney: Renal measurements: 7.0 x 3.5 x 2.8 cm = volume: 37 mL. Atrophic with cortical thinning. No mass or hydronephrosis. Bladder: Decompressed with Foley catheter in place. Other: Moderate  ascites in the abdomen and pelvis. IMPRESSION: No acute findings. Atrophic left kidney, stable. Moderate ascites. Electronically Signed   By: Charlett Nose M.D.   On: 09/11/2023 19:18   ECHOCARDIOGRAM COMPLETE  Result Date: 09/11/2023     ECHOCARDIOGRAM REPORT   Patient Name:   Taylor [name] Date of Exam: 09/11/2023 Medical Rec #:  [id number]    Height:       70.0 in Accession #:    [id number]   Weight:       158.3 lb Date of Birth:  Sep 26, 1941   BSA:          1.890 m Patient Age:    81 years     BP:           103/69 mmHg Patient Gender: M            HR:           62 bpm.  PROGRESS NOTE    Taylor [name]   ION:[id number] DOB: 09/15/1941  DOA: 09/10/2023 Date of Service: 09/13/23 which is hospital day 3  PCP: Malva Limes, MD    HPI: Taylor [name] is a 82 y.o. male past medical history significant for HFrEF (< 20%), CKD stage IV, hypertension, hyperlipidemia, diabetes, COPD on chronic home 2 L, CAD, history of atrial fibrillation on Eliquis, who presents to ED from home via EMS for fall x3. Multiple falls past month. Concern for low BP. No LOC.   Pertinent recent tx/imaging: 10/04: visit ED due to fall - (+)minimally displaced and comminuted radial styloid fracture --> splint to left forearm. (+)fx nasal bones and L maxilla.   10/10: ENT visit, discuss repair options for facial fx, not interested, conservative mgt.   Hospital course / significant events:  10/16: to ED. (+)sepsis, severe - hypothermic Tmin 42F, low BP 80s/60s, AKI, hyperkalemic K max 6.8 improved w/ tx, Cr on admission 5.46 above baseline 2.7, lactic max 3.8. CT (+)PNA RML and LLL, mod R pleural effusion also noted on previous CT. Tx midodrine, steroids, fluids, vanc/cefepime.  10/17: BP improved, renal fxn and K improved, Echo LVEF <20%, severely decreased fxn and global hypokinesis, grade III diastolic dysfunction, severe RV overload, severe RA and LA dilation. Renal US pending read. Palliative care consult, see note. Cardiology consult 10/18: stable off pressors, transfer to progressive unit. Continue to monitor renal/cardiac function closely.  10/19: no improvement in renal function, UOP improved, continue to monitor.   Consultants:  Nephrology PCCU Palliative care  Cardiology, advanced HF team   Procedures/Surgeries: none      ASSESSMENT & PLAN:    INFECTIOUS   Severe sepsis due to HCAP on CT, RML, LLL. POA (+)MRSA screen, pending cultures  Neg COVID and resp viral PCR  meets criteria for severe sepsis with WBC 13.9, hypothermia with temperature 92.1.  Lactic acid is  elevated at 3.8 --> 2.9. Patient has been persistently hypotensive. admit to SDU as inp Vancomycin and cefepime initiated 10/16 and patient received 1 dose of Levaquin in ED. MRSA screen (+), continue vancomycin  Blood and sputum cultures, urine antigen of Legionella and strep IV fluid: total of 1.75 L of NS, then 150 mEq of sodium bicarbonate at 75 cc/h - caution w/ Hx CHF Midodrine Phenylephrine d/c this morning - monitor and low threshold to restart pressors  Hypothermia - resolved Bair hugger Tx sepsis as above     RENAL  Acute renal failure superimposed on stage 4 chronic kidney disease Hyperkalemia and hyperphosphatemia likely as result of AKI  creatinine 5.46, BUN 93 and GFR 10 (recent baseline creatinine 2.82 and GFR 22 on 10//24) --> Cr 5.17 today 09/13/23  Ddx include UTI, dehydration and continuation of diuretics.  ATN is possible due to hypotension. Question hepatorenal syndrome.   Nephrology and cardiology following  holding potassium and spironolactone Holding diuretics Hold nephrotoxic rx  Follow UA --> no UTI, (+)hyaline casts, (+)micro hematuria Renal US --> no concerns Poor candidate for long term dialysis   Hyperkalemia:  Potassium 6.8 --> 4.4. Likely d/t AKI/ATN pt was treated with 1 g calcium gluconate, 5 units of NovoLog, 50 cc of D50, 50 mEq of sodium bicarbonate in ED S/p Patiromer 16.8 g  Nephrology following  consulted pharmacist for electrolytes monitoring  Hyponatremia likely d/t AKI Monitor BMP Treat underlying causes    CARDIAC  Chronic combined systolic and diastolic CHF (congestive heart failure)  2D echo on 10/30/2022 showed EF<20%.

## 2023-09-13 NOTE — Progress Notes (Signed)
[facility]  PROGRESS NOTE   Subjective:   Patient seen at bedside.  Feels slightly better today. Family at bedside.  Objective:  Vital signs: Blood pressure 119/75, pulse 60, temperature (!) 97.4 F (36.3 C), resp. rate 18, height 5\' 10"  (1.778 m), weight 75.8 kg, SpO2 100%.  Intake/Output Summary (Last 24 hours) at 09/13/2023 1419 Last data filed at 09/13/2023 1235 Gross per 24 hour  Intake --  Output 1725 ml  Net -1725 ml   Filed Weights   09/10/23 2300 09/11/23 0500 09/12/23 0500  Weight: 71.8 kg 71.8 kg 75.8 kg     Physical Exam: General:  No acute distress  Head:  Normocephalic, atraumatic. Moist oral mucosal membranes  Eyes:  Anicteric  Neck:  Supple  Lungs:   Clear to auscultation, normal effort  Heart:  S1S2 no rubs  Abdomen:   Soft, nontender, bowel sounds present  Extremities:  peripheral edema.  Neurologic:  Awake, alert, following commands  Skin:  No lesions  Access:     Basic Metabolic Panel: Recent Labs  Lab 09/10/23 1606 09/10/23 1801 09/11/23 0330 09/11/23 1035 09/11/23 1754 09/12/23 0500 09/13/23 0251  NA 135  --  132* 134* 132* 131* 135  K 6.8*   < > 6.0* 5.5* 5.0 4.4 4.0  CL 95*  --  95* 94* 90* 90* 91*  CO2 25  --  24 27 26 26 27   GLUCOSE 173*  --  90 124* 174* 142* 127*  BUN 93*  --  99* 95* 96* 104* 108*  CREATININE 5.46*  --  4.95* 5.17* 5.27* 5.17* 5.17*  CALCIUM 8.9  --  8.7* 9.3 9.1 8.8* 8.7*  MG 2.8*  --  2.3  --   --  2.3 2.2  PHOS 6.5*  --  5.9*  --   --  7.0* 5.8*   < > = values in this interval not displayed.   GFR: Estimated Creatinine Clearance: 11.8 mL/min (A) (by C-G formula based on SCr of 5.17 mg/dL (H)).  Liver Function Tests: Recent Labs  Lab 09/10/23 1606 09/11/23 0330  AST 28 23  ALT 10 9  ALKPHOS 141* 119  BILITOT 3.1* 2.6*  PROT 7.9 7.1  ALBUMIN 2.8* 2.8*   No results for input(s): "LIPASE", "AMYLASE" in the last 168 hours. Recent Labs  Lab 09/10/23 2138  AMMONIA 42*     CBC: Recent Labs  Lab 09/10/23 1606 09/11/23 0330 09/12/23 0500  WBC 13.9* 9.7 9.9  NEUTROABS 12.0*  --   --   HGB 12.3* 11.0* 11.1*  HCT 40.2 35.3* 35.0*  MCV 101.0* 98.9 97.0  PLT 133* 129* 152     HbA1C: Hemoglobin A1C  Date/Time Value Ref Range Status  08/15/2023 02:04 PM 6.6 (A) 4.0 - 5.6 % Final   Hgb A1c MFr Bld  Date/Time Value Ref Range Status  09/11/2023 12:06 AM 6.6 (H) 4.8 - 5.6 % Final    Comment:    (NOTE) Pre diabetes:          5.7%-6.4%  Diabetes:              >6.4%  Glycemic control for   <7.0% adults with diabetes   05/05/2023 02:32 PM 7.1 (H) 4.8 - 5.6 % Final    Comment:             Prediabetes: 5.7 - 6.4          Diabetes: >6.4          Glycemic  control for adults with diabetes: <7.0     Urinalysis: Recent Labs    09/11/23 0409  COLORURINE YELLOW*  LABSPEC 1.015  PHURINE 5.0  GLUCOSEU 50*  HGBUR MODERATE*  BILIRUBINUR NEGATIVE  KETONESUR NEGATIVE  PROTEINUR NEGATIVE  NITRITE NEGATIVE  LEUKOCYTESUR NEGATIVE      Imaging: US RENAL  Result Date: 09/11/2023 CLINICAL DATA:  Acute on chronic renal failure EXAM: RENAL / URINARY TRACT ULTRASOUND COMPLETE COMPARISON:  07/29/2023.  CT 09/10/2023 FINDINGS: Right Kidney: Renal measurements: 10.3 x 6.2 x 5.8 cm = volume: 192 mL. Echogenicity within normal limits. No mass or hydronephrosis visualized. Left Kidney: Renal measurements: 7.0 x 3.5 x 2.8 cm = volume: 37 mL. Atrophic with cortical thinning. No mass or hydronephrosis. Bladder: Decompressed with Foley catheter in place. Other: Moderate  ascites in the abdomen and pelvis. IMPRESSION: No acute findings. Atrophic left kidney, stable. Moderate ascites. Electronically Signed   By: Charlett Nose M.D.   On: 09/11/2023 19:18     Medications:     aspirin EC  81 mg Oral Daily   Chlorhexidine Gluconate Cloth  6 each Topical QHS   feeding supplement (NEPRO CARB STEADY)  237 mL Oral TID BM   heparin injection (subcutaneous)  5,000 Units  Subcutaneous Q8H   insulin aspart  0-5 Units Subcutaneous QHS   insulin aspart  0-9 Units Subcutaneous TID WC   lactulose  10 g Oral BID   midodrine  5 mg Oral TID WC   multivitamin with minerals  1 tablet Oral Daily   mupirocin ointment  1 Application Nasal BID   pantoprazole  40 mg Oral Daily   sodium chloride flush  10 mL Intravenous Q12H   triamcinolone ointment   Topical BID    Assessment/ Plan:     82 year old white male with a past medical history of hypertension, coronary artery disease, congestive heart failure with ejection fraction of less than 20%, diabetes, peripheral vascular disease, CABG, status post AICD, cirrhosis of the liver with ascites on chronic kidney disease stage IV now admitted with history of pneumonia and hyperkalemia. Patient also has acute kidney injury on chronic kidney disease.  #1: Acute kidney injury on chronic kidney disease: His baseline creatinine has been 2.65 which has now gone up to 5.  eGFR is close to 10 cc/min.  The acute kidney injury is most likely secondary to hemodynamic compromise.  Urine output has improved since.  He is not a good candidate for long-term hemodialysis treatments.  #2: Hyperkalemia: Potassium has now improved.  #3: Community-acquired pneumonia: He is now off of IV antibiotics.  Patient received vancomycin and cefepime.  #4: Cirrhosis/ascites: Patient has nonalcoholic liver cirrhosis with ascites.  Continue lactulose and midodrine as ordered.  Continue to monitor ammonia levels.  #5: Hypertension/congestive heart failure: Presently off of IV fluids.  Will need to resume low-dose diuretics on discharge.  Spoke to the patient and his wife at bedside in detail. He is not a good candidate for long-term renal replacement therapy.  Labs and medications reviewed. Will continue to follow along with you.   LOS: 3 Lorain Childes, MD [facility] 10/19/20242:19 PM

## 2023-09-14 DIAGNOSIS — J189 Pneumonia, unspecified organism: Secondary | ICD-10-CM | POA: Diagnosis not present

## 2023-09-14 LAB — BASIC METABOLIC PANEL
Anion gap: 12 (ref 5–15)
BUN: 97 mg/dL — ABNORMAL HIGH (ref 8–23)
CO2: 29 mmol/L (ref 22–32)
Calcium: 8.9 mg/dL (ref 8.9–10.3)
Chloride: 96 mmol/L — ABNORMAL LOW (ref 98–111)
Creatinine, Ser: 4.1 mg/dL — ABNORMAL HIGH (ref 0.61–1.24)
GFR, Estimated: 14 mL/min — ABNORMAL LOW (ref >60)
Glucose, Bld: 123 mg/dL — ABNORMAL HIGH (ref 70–99)
Potassium: 3.9 mmol/L (ref 3.5–5.1)
Sodium: 137 mmol/L (ref 135–145)

## 2023-09-14 LAB — APTT: aPTT: 161 s (ref 24–36)

## 2023-09-14 LAB — GLUCOSE, CAPILLARY
Glucose-Capillary: 115 mg/dL — ABNORMAL HIGH (ref 70–99)
Glucose-Capillary: 133 mg/dL — ABNORMAL HIGH (ref 70–99)
Glucose-Capillary: 140 mg/dL — ABNORMAL HIGH (ref 70–99)
Glucose-Capillary: 129 mg/dL — ABNORMAL HIGH (ref 70–99)

## 2023-09-14 LAB — PROTIME-INR
INR: 1.5 — ABNORMAL HIGH (ref 0.8–1.2)
Prothrombin Time: 18.2 s — ABNORMAL HIGH (ref 11.4–15.2)

## 2023-09-14 LAB — HEPARIN LEVEL (UNFRACTIONATED): Heparin Unfractionated: 0.87 [IU]/mL — ABNORMAL HIGH (ref 0.30–0.70)

## 2023-09-14 MED ORDER — MORPHINE SULFATE (PF) 4 MG/ML IV SOLN: 4.0000 mg | Freq: Once | INTRAVENOUS | Status: DC | PRN

## 2023-09-14 MED ORDER — NITROGLYCERIN 0.4 MG SL SUBL
SUBLINGUAL_TABLET | SUBLINGUAL | Status: AC
  Administered 2023-09-14: 0.4 mg
  Filled 2023-09-14: qty 1

## 2023-09-14 MED ORDER — HEPARIN BOLUS VIA INFUSION
4000.0000 [IU] | Freq: Once | INTRAVENOUS | Status: AC
  Administered 2023-09-14: 4000 [IU] via INTRAVENOUS
  Filled 2023-09-14: qty 4000

## 2023-09-14 MED ORDER — NITROGLYCERIN IN D5W 200-5 MCG/ML-% IV SOLN
0.0000 ug/min | INTRAVENOUS | Status: DC
  Administered 2023-09-14: 5 ug/min via INTRAVENOUS
  Administered 2023-09-15 (×2): 60 ug/min via INTRAVENOUS
  Administered 2023-09-16: 45 ug/min via INTRAVENOUS
  Administered 2023-09-16: 50 ug/min via INTRAVENOUS
  Administered 2023-09-16: 10 ug/min via INTRAVENOUS
  Administered 2023-09-16: 60 ug/min via INTRAVENOUS
  Administered 2023-09-16: 25 ug/min via INTRAVENOUS
  Administered 2023-09-16: 40 ug/min via INTRAVENOUS
  Administered 2023-09-16: 20 ug/min via INTRAVENOUS
  Administered 2023-09-16: 5 ug/min via INTRAVENOUS
  Administered 2023-09-16: 35 ug/min via INTRAVENOUS
  Administered 2023-09-16: 15 ug/min via INTRAVENOUS
  Administered 2023-09-16: 30 ug/min via INTRAVENOUS
  Administered 2023-09-16: 55 ug/min via INTRAVENOUS
  Filled 2023-09-14 (×4): qty 250

## 2023-09-14 MED ORDER — NITROGLYCERIN 0.4 MG SL SUBL
SUBLINGUAL_TABLET | SUBLINGUAL | Status: AC
  Filled 2023-09-14: qty 1

## 2023-09-14 MED ORDER — HEPARIN (PORCINE) 25000 UT/250ML-% IV SOLN
1000.0000 [IU]/h | INTRAVENOUS | Status: DC
  Administered 2023-09-14: 1000 [IU]/h via INTRAVENOUS
  Administered 2023-09-15: 850 [IU]/h via INTRAVENOUS
  Filled 2023-09-14 (×2): qty 250

## 2023-09-14 NOTE — Progress Notes (Addendum)
70.0 in Accession #:    [id number]   Weight:       158.3 lb Date of Birth:  1941/08/28   BSA:          1.890 m Patient Age:    81 years     BP:           103/69 mmHg Patient Gender: M            HR:           62 bpm. Exam Location:  [facility] Procedure: 2D Echo, 3D Echo, Cardiac Doppler and Color Doppler Indications:     Syncope  History:         Patient has prior history of Echocardiogram examinations, most                  recent 10/30/2022. CHF and Cardiomyopathy, CAD and Previous                  Myocardial Infarction, Pacemaker and Defibrillator, Stroke,                  Arrythmias:Atrial Fibrillation and Bradycardia,                  Signs/Symptoms:Syncope and Dyspnea; Risk Factors:Hypertension                  and Diabetes. AAA, CKD.  Sonographer:     Mikki Harbor Referring Phys:  [id number] BRITTON L RUST-CHESTER Diagnosing Phys: Rozell Searing Custovic IMPRESSIONS  1. Left ventricular ejection fraction, by estimation, is <20%. Left ventricular ejection fraction by 2D MOD biplane is 23.9 %. Left ventricular ejection fraction by PLAX is 20 %. The left ventricle has severely decreased function. The left ventricle demonstrates global hypokinesis. The left ventricular internal cavity size was moderately to severely dilated. There is mild left ventricular hypertrophy. Left ventricular diastolic  parameters are consistent with Grade III diastolic dysfunction (restrictive).  2. Severe RV pressure and volume overload. RVSP falsely low due to equalization of pressures. Right ventricular systolic function is severely reduced. The right ventricular size is severely enlarged. There is normal pulmonary artery systolic pressure. The estimated right ventricular systolic pressure is 34.0 mmHg.  3. Left atrial size was severely dilated.  4. Right atrial size was severely dilated.  5. The mitral valve is degenerative. Moderate to severe mitral valve regurgitation.  6. The tricuspid valve is degenerative. Tricuspid valve regurgitation is severe.  7. The aortic valve is grossly normal. Aortic valve regurgitation is mild. Aortic valve sclerosis is present, with no evidence of aortic valve stenosis. FINDINGS  Left Ventricle: Left ventricular ejection fraction, by estimation, is <20%. Left ventricular ejection fraction by PLAX is 20 %. Left ventricular ejection fraction by 2D MOD biplane is 23.9 %. The left ventricle has severely decreased function. The left ventricle demonstrates global hypokinesis. The left ventricular internal cavity size was moderately to severely dilated. There is mild left ventricular hypertrophy. Left ventricular diastolic parameters are consistent with Grade III diastolic dysfunction  (restrictive). Right Ventricle: Severe RV pressure and volume overload. RVSP falsely low due to equalization of pressures. The right ventricular size is severely enlarged. No increase in right ventricular wall thickness. Right ventricular systolic function is severely reduced. There is normal pulmonary artery systolic pressure. The tricuspid regurgitant velocity is 2.18 m/s, and with an assumed right atrial pressure of 15 mmHg, the estimated right ventricular systolic pressure is 34.0 mmHg. Left Atrium: Left atrial size was severely dilated. Right Atrium: Right  70.0 in Accession #:    [id number]   Weight:       158.3 lb Date of Birth:  1941/08/28   BSA:          1.890 m Patient Age:    81 years     BP:           103/69 mmHg Patient Gender: M            HR:           62 bpm. Exam Location:  [facility] Procedure: 2D Echo, 3D Echo, Cardiac Doppler and Color Doppler Indications:     Syncope  History:         Patient has prior history of Echocardiogram examinations, most                  recent 10/30/2022. CHF and Cardiomyopathy, CAD and Previous                  Myocardial Infarction, Pacemaker and Defibrillator, Stroke,                  Arrythmias:Atrial Fibrillation and Bradycardia,                  Signs/Symptoms:Syncope and Dyspnea; Risk Factors:Hypertension                  and Diabetes. AAA, CKD.  Sonographer:     Mikki Harbor Referring Phys:  [id number] BRITTON L RUST-CHESTER Diagnosing Phys: Rozell Searing Custovic IMPRESSIONS  1. Left ventricular ejection fraction, by estimation, is <20%. Left ventricular ejection fraction by 2D MOD biplane is 23.9 %. Left ventricular ejection fraction by PLAX is 20 %. The left ventricle has severely decreased function. The left ventricle demonstrates global hypokinesis. The left ventricular internal cavity size was moderately to severely dilated. There is mild left ventricular hypertrophy. Left ventricular diastolic  parameters are consistent with Grade III diastolic dysfunction (restrictive).  2. Severe RV pressure and volume overload. RVSP falsely low due to equalization of pressures. Right ventricular systolic function is severely reduced. The right ventricular size is severely enlarged. There is normal pulmonary artery systolic pressure. The estimated right ventricular systolic pressure is 34.0 mmHg.  3. Left atrial size was severely dilated.  4. Right atrial size was severely dilated.  5. The mitral valve is degenerative. Moderate to severe mitral valve regurgitation.  6. The tricuspid valve is degenerative. Tricuspid valve regurgitation is severe.  7. The aortic valve is grossly normal. Aortic valve regurgitation is mild. Aortic valve sclerosis is present, with no evidence of aortic valve stenosis. FINDINGS  Left Ventricle: Left ventricular ejection fraction, by estimation, is <20%. Left ventricular ejection fraction by PLAX is 20 %. Left ventricular ejection fraction by 2D MOD biplane is 23.9 %. The left ventricle has severely decreased function. The left ventricle demonstrates global hypokinesis. The left ventricular internal cavity size was moderately to severely dilated. There is mild left ventricular hypertrophy. Left ventricular diastolic parameters are consistent with Grade III diastolic dysfunction  (restrictive). Right Ventricle: Severe RV pressure and volume overload. RVSP falsely low due to equalization of pressures. The right ventricular size is severely enlarged. No increase in right ventricular wall thickness. Right ventricular systolic function is severely reduced. There is normal pulmonary artery systolic pressure. The tricuspid regurgitant velocity is 2.18 m/s, and with an assumed right atrial pressure of 15 mmHg, the estimated right ventricular systolic pressure is 34.0 mmHg. Left Atrium: Left atrial size was severely dilated. Right Atrium: Right  PROGRESS NOTE    Taylor [name]   WUJ:[id number] DOB: 09/23/1941  DOA: 09/10/2023 Date of Service: 09/14/23 which is hospital day 4  PCP: Malva Limes, MD    HPI: Taylor [name] is a 82 y.o. male past medical history significant for HFrEF (< 20%), CKD stage IV, hypertension, hyperlipidemia, diabetes, COPD on chronic home 2 L, CAD, history of atrial fibrillation on Eliquis, who presents to ED from home via EMS for fall x3. Multiple falls past month. Concern for low BP. No LOC.   Pertinent recent tx/imaging: 10/04: visit ED due to fall - (+)minimally displaced and comminuted radial styloid fracture --> splint to left forearm. (+)fx nasal bones and L maxilla.   10/10: ENT visit, discuss repair options for facial fx, not interested, conservative mgt.   Hospital course / significant events:  10/16: to ED. (+)sepsis, severe - hypothermic Tmin 63F, low BP 80s/60s, AKI, hyperkalemic K max 6.8 improved w/ tx, Cr on admission 5.46 above baseline 2.7, lactic max 3.8. CT (+)PNA RML and LLL, mod R pleural effusion also noted on previous CT. Tx midodrine, steroids, fluids, vanc/cefepime.  10/17: BP improved, renal fxn and K improved, Echo LVEF <20%, severely decreased fxn and global hypokinesis, grade III diastolic dysfunction, severe RV overload, severe RA and LA dilation. Renal US pending read. Palliative care consult, see note. Cardiology consult 10/18: stable off pressors, transfer to progressive unit. Continue to monitor renal/cardiac function closely.  10/19: no improvement in renal function, UOP improved, continue to monitor.  10/20: some improved renal fxn, continued chest pain, agrees for DNR/DNI see A/P below.   Consultants:  Nephrology PCCU Palliative care  Cardiology, advanced HF team   Procedures/Surgeries: none      ASSESSMENT & PLAN:    INFECTIOUS   Severe sepsis due to HCAP on CT, RML, LLL. POA (+)MRSA screen, pending cultures  Neg COVID and resp viral PCR  meets  criteria for severe sepsis with WBC 13.9, hypothermia with temperature 92.1.  Lactic acid is elevated at 3.8 --> 2.9. Patient has been persistently hypotensive. admit to SDU as inp Vancomycin and cefepime initiated 10/16 and patient received 1 dose of Levaquin in ED. MRSA screen (+), continue vancomycin  Blood and sputum cultures, urine antigen of Legionella and strep IV fluid: total of 1.75 L of NS, then 150 mEq of sodium bicarbonate at 75 cc/h - caution w/ Hx CHF Midodrine Phenylephrine d/c this morning - monitor and low threshold to restart pressors  Hypothermia - resolved Bair hugger Tx sepsis as above     RENAL  Acute renal failure superimposed on stage 4 chronic kidney disease Hyperkalemia and hyperphosphatemia likely as result of AKI  creatinine 5.46, BUN 93 and GFR 10 (recent baseline creatinine 2.82 and GFR 22 on 10//24) --> Cr 5.17 today 09/13/23  Ddx include UTI, dehydration and continuation of diuretics.  ATN is possible due to hypotension. Question hepatorenal syndrome.   Nephrology and cardiology following  holding potassium and spironolactone Holding diuretics Hold nephrotoxic rx  Follow UA --> no UTI, (+)hyaline casts, (+)micro hematuria Renal US --> no concerns Poor candidate for long term dialysis   Hyperkalemia:  Potassium 6.8 --> 4.4. Likely d/t AKI/ATN pt was treated with 1 g calcium gluconate, 5 units of NovoLog, 50 cc of D50, 50 mEq of sodium bicarbonate in ED S/p Patiromer 16.8 g  Nephrology following  consulted pharmacist for electrolytes monitoring  Hyponatremia likely d/t AKI Monitor BMP Treat underlying causes    CARDIAC  Chronic  PROGRESS NOTE    Taylor [name]   WUJ:[id number] DOB: 09/23/1941  DOA: 09/10/2023 Date of Service: 09/14/23 which is hospital day 4  PCP: Malva Limes, MD    HPI: Taylor [name] is a 82 y.o. male past medical history significant for HFrEF (< 20%), CKD stage IV, hypertension, hyperlipidemia, diabetes, COPD on chronic home 2 L, CAD, history of atrial fibrillation on Eliquis, who presents to ED from home via EMS for fall x3. Multiple falls past month. Concern for low BP. No LOC.   Pertinent recent tx/imaging: 10/04: visit ED due to fall - (+)minimally displaced and comminuted radial styloid fracture --> splint to left forearm. (+)fx nasal bones and L maxilla.   10/10: ENT visit, discuss repair options for facial fx, not interested, conservative mgt.   Hospital course / significant events:  10/16: to ED. (+)sepsis, severe - hypothermic Tmin 63F, low BP 80s/60s, AKI, hyperkalemic K max 6.8 improved w/ tx, Cr on admission 5.46 above baseline 2.7, lactic max 3.8. CT (+)PNA RML and LLL, mod R pleural effusion also noted on previous CT. Tx midodrine, steroids, fluids, vanc/cefepime.  10/17: BP improved, renal fxn and K improved, Echo LVEF <20%, severely decreased fxn and global hypokinesis, grade III diastolic dysfunction, severe RV overload, severe RA and LA dilation. Renal US pending read. Palliative care consult, see note. Cardiology consult 10/18: stable off pressors, transfer to progressive unit. Continue to monitor renal/cardiac function closely.  10/19: no improvement in renal function, UOP improved, continue to monitor.  10/20: some improved renal fxn, continued chest pain, agrees for DNR/DNI see A/P below.   Consultants:  Nephrology PCCU Palliative care  Cardiology, advanced HF team   Procedures/Surgeries: none      ASSESSMENT & PLAN:    INFECTIOUS   Severe sepsis due to HCAP on CT, RML, LLL. POA (+)MRSA screen, pending cultures  Neg COVID and resp viral PCR  meets  criteria for severe sepsis with WBC 13.9, hypothermia with temperature 92.1.  Lactic acid is elevated at 3.8 --> 2.9. Patient has been persistently hypotensive. admit to SDU as inp Vancomycin and cefepime initiated 10/16 and patient received 1 dose of Levaquin in ED. MRSA screen (+), continue vancomycin  Blood and sputum cultures, urine antigen of Legionella and strep IV fluid: total of 1.75 L of NS, then 150 mEq of sodium bicarbonate at 75 cc/h - caution w/ Hx CHF Midodrine Phenylephrine d/c this morning - monitor and low threshold to restart pressors  Hypothermia - resolved Bair hugger Tx sepsis as above     RENAL  Acute renal failure superimposed on stage 4 chronic kidney disease Hyperkalemia and hyperphosphatemia likely as result of AKI  creatinine 5.46, BUN 93 and GFR 10 (recent baseline creatinine 2.82 and GFR 22 on 10//24) --> Cr 5.17 today 09/13/23  Ddx include UTI, dehydration and continuation of diuretics.  ATN is possible due to hypotension. Question hepatorenal syndrome.   Nephrology and cardiology following  holding potassium and spironolactone Holding diuretics Hold nephrotoxic rx  Follow UA --> no UTI, (+)hyaline casts, (+)micro hematuria Renal US --> no concerns Poor candidate for long term dialysis   Hyperkalemia:  Potassium 6.8 --> 4.4. Likely d/t AKI/ATN pt was treated with 1 g calcium gluconate, 5 units of NovoLog, 50 cc of D50, 50 mEq of sodium bicarbonate in ED S/p Patiromer 16.8 g  Nephrology following  consulted pharmacist for electrolytes monitoring  Hyponatremia likely d/t AKI Monitor BMP Treat underlying causes    CARDIAC  Chronic  70.0 in Accession #:    [id number]   Weight:       158.3 lb Date of Birth:  1941/08/28   BSA:          1.890 m Patient Age:    81 years     BP:           103/69 mmHg Patient Gender: M            HR:           62 bpm. Exam Location:  [facility] Procedure: 2D Echo, 3D Echo, Cardiac Doppler and Color Doppler Indications:     Syncope  History:         Patient has prior history of Echocardiogram examinations, most                  recent 10/30/2022. CHF and Cardiomyopathy, CAD and Previous                  Myocardial Infarction, Pacemaker and Defibrillator, Stroke,                  Arrythmias:Atrial Fibrillation and Bradycardia,                  Signs/Symptoms:Syncope and Dyspnea; Risk Factors:Hypertension                  and Diabetes. AAA, CKD.  Sonographer:     Mikki Harbor Referring Phys:  [id number] BRITTON L RUST-CHESTER Diagnosing Phys: Rozell Searing Custovic IMPRESSIONS  1. Left ventricular ejection fraction, by estimation, is <20%. Left ventricular ejection fraction by 2D MOD biplane is 23.9 %. Left ventricular ejection fraction by PLAX is 20 %. The left ventricle has severely decreased function. The left ventricle demonstrates global hypokinesis. The left ventricular internal cavity size was moderately to severely dilated. There is mild left ventricular hypertrophy. Left ventricular diastolic  parameters are consistent with Grade III diastolic dysfunction (restrictive).  2. Severe RV pressure and volume overload. RVSP falsely low due to equalization of pressures. Right ventricular systolic function is severely reduced. The right ventricular size is severely enlarged. There is normal pulmonary artery systolic pressure. The estimated right ventricular systolic pressure is 34.0 mmHg.  3. Left atrial size was severely dilated.  4. Right atrial size was severely dilated.  5. The mitral valve is degenerative. Moderate to severe mitral valve regurgitation.  6. The tricuspid valve is degenerative. Tricuspid valve regurgitation is severe.  7. The aortic valve is grossly normal. Aortic valve regurgitation is mild. Aortic valve sclerosis is present, with no evidence of aortic valve stenosis. FINDINGS  Left Ventricle: Left ventricular ejection fraction, by estimation, is <20%. Left ventricular ejection fraction by PLAX is 20 %. Left ventricular ejection fraction by 2D MOD biplane is 23.9 %. The left ventricle has severely decreased function. The left ventricle demonstrates global hypokinesis. The left ventricular internal cavity size was moderately to severely dilated. There is mild left ventricular hypertrophy. Left ventricular diastolic parameters are consistent with Grade III diastolic dysfunction  (restrictive). Right Ventricle: Severe RV pressure and volume overload. RVSP falsely low due to equalization of pressures. The right ventricular size is severely enlarged. No increase in right ventricular wall thickness. Right ventricular systolic function is severely reduced. There is normal pulmonary artery systolic pressure. The tricuspid regurgitant velocity is 2.18 m/s, and with an assumed right atrial pressure of 15 mmHg, the estimated right ventricular systolic pressure is 34.0 mmHg. Left Atrium: Left atrial size was severely dilated. Right Atrium: Right  70.0 in Accession #:    [id number]   Weight:       158.3 lb Date of Birth:  1941/08/28   BSA:          1.890 m Patient Age:    81 years     BP:           103/69 mmHg Patient Gender: M            HR:           62 bpm. Exam Location:  [facility] Procedure: 2D Echo, 3D Echo, Cardiac Doppler and Color Doppler Indications:     Syncope  History:         Patient has prior history of Echocardiogram examinations, most                  recent 10/30/2022. CHF and Cardiomyopathy, CAD and Previous                  Myocardial Infarction, Pacemaker and Defibrillator, Stroke,                  Arrythmias:Atrial Fibrillation and Bradycardia,                  Signs/Symptoms:Syncope and Dyspnea; Risk Factors:Hypertension                  and Diabetes. AAA, CKD.  Sonographer:     Mikki Harbor Referring Phys:  [id number] BRITTON L RUST-CHESTER Diagnosing Phys: Rozell Searing Custovic IMPRESSIONS  1. Left ventricular ejection fraction, by estimation, is <20%. Left ventricular ejection fraction by 2D MOD biplane is 23.9 %. Left ventricular ejection fraction by PLAX is 20 %. The left ventricle has severely decreased function. The left ventricle demonstrates global hypokinesis. The left ventricular internal cavity size was moderately to severely dilated. There is mild left ventricular hypertrophy. Left ventricular diastolic  parameters are consistent with Grade III diastolic dysfunction (restrictive).  2. Severe RV pressure and volume overload. RVSP falsely low due to equalization of pressures. Right ventricular systolic function is severely reduced. The right ventricular size is severely enlarged. There is normal pulmonary artery systolic pressure. The estimated right ventricular systolic pressure is 34.0 mmHg.  3. Left atrial size was severely dilated.  4. Right atrial size was severely dilated.  5. The mitral valve is degenerative. Moderate to severe mitral valve regurgitation.  6. The tricuspid valve is degenerative. Tricuspid valve regurgitation is severe.  7. The aortic valve is grossly normal. Aortic valve regurgitation is mild. Aortic valve sclerosis is present, with no evidence of aortic valve stenosis. FINDINGS  Left Ventricle: Left ventricular ejection fraction, by estimation, is <20%. Left ventricular ejection fraction by PLAX is 20 %. Left ventricular ejection fraction by 2D MOD biplane is 23.9 %. The left ventricle has severely decreased function. The left ventricle demonstrates global hypokinesis. The left ventricular internal cavity size was moderately to severely dilated. There is mild left ventricular hypertrophy. Left ventricular diastolic parameters are consistent with Grade III diastolic dysfunction  (restrictive). Right Ventricle: Severe RV pressure and volume overload. RVSP falsely low due to equalization of pressures. The right ventricular size is severely enlarged. No increase in right ventricular wall thickness. Right ventricular systolic function is severely reduced. There is normal pulmonary artery systolic pressure. The tricuspid regurgitant velocity is 2.18 m/s, and with an assumed right atrial pressure of 15 mmHg, the estimated right ventricular systolic pressure is 34.0 mmHg. Left Atrium: Left atrial size was severely dilated. Right Atrium: Right  70.0 in Accession #:    [id number]   Weight:       158.3 lb Date of Birth:  1941/08/28   BSA:          1.890 m Patient Age:    81 years     BP:           103/69 mmHg Patient Gender: M            HR:           62 bpm. Exam Location:  [facility] Procedure: 2D Echo, 3D Echo, Cardiac Doppler and Color Doppler Indications:     Syncope  History:         Patient has prior history of Echocardiogram examinations, most                  recent 10/30/2022. CHF and Cardiomyopathy, CAD and Previous                  Myocardial Infarction, Pacemaker and Defibrillator, Stroke,                  Arrythmias:Atrial Fibrillation and Bradycardia,                  Signs/Symptoms:Syncope and Dyspnea; Risk Factors:Hypertension                  and Diabetes. AAA, CKD.  Sonographer:     Mikki Harbor Referring Phys:  [id number] BRITTON L RUST-CHESTER Diagnosing Phys: Rozell Searing Custovic IMPRESSIONS  1. Left ventricular ejection fraction, by estimation, is <20%. Left ventricular ejection fraction by 2D MOD biplane is 23.9 %. Left ventricular ejection fraction by PLAX is 20 %. The left ventricle has severely decreased function. The left ventricle demonstrates global hypokinesis. The left ventricular internal cavity size was moderately to severely dilated. There is mild left ventricular hypertrophy. Left ventricular diastolic  parameters are consistent with Grade III diastolic dysfunction (restrictive).  2. Severe RV pressure and volume overload. RVSP falsely low due to equalization of pressures. Right ventricular systolic function is severely reduced. The right ventricular size is severely enlarged. There is normal pulmonary artery systolic pressure. The estimated right ventricular systolic pressure is 34.0 mmHg.  3. Left atrial size was severely dilated.  4. Right atrial size was severely dilated.  5. The mitral valve is degenerative. Moderate to severe mitral valve regurgitation.  6. The tricuspid valve is degenerative. Tricuspid valve regurgitation is severe.  7. The aortic valve is grossly normal. Aortic valve regurgitation is mild. Aortic valve sclerosis is present, with no evidence of aortic valve stenosis. FINDINGS  Left Ventricle: Left ventricular ejection fraction, by estimation, is <20%. Left ventricular ejection fraction by PLAX is 20 %. Left ventricular ejection fraction by 2D MOD biplane is 23.9 %. The left ventricle has severely decreased function. The left ventricle demonstrates global hypokinesis. The left ventricular internal cavity size was moderately to severely dilated. There is mild left ventricular hypertrophy. Left ventricular diastolic parameters are consistent with Grade III diastolic dysfunction  (restrictive). Right Ventricle: Severe RV pressure and volume overload. RVSP falsely low due to equalization of pressures. The right ventricular size is severely enlarged. No increase in right ventricular wall thickness. Right ventricular systolic function is severely reduced. There is normal pulmonary artery systolic pressure. The tricuspid regurgitant velocity is 2.18 m/s, and with an assumed right atrial pressure of 15 mmHg, the estimated right ventricular systolic pressure is 34.0 mmHg. Left Atrium: Left atrial size was severely dilated. Right Atrium: Right  70.0 in Accession #:    [id number]   Weight:       158.3 lb Date of Birth:  1941/08/28   BSA:          1.890 m Patient Age:    81 years     BP:           103/69 mmHg Patient Gender: M            HR:           62 bpm. Exam Location:  [facility] Procedure: 2D Echo, 3D Echo, Cardiac Doppler and Color Doppler Indications:     Syncope  History:         Patient has prior history of Echocardiogram examinations, most                  recent 10/30/2022. CHF and Cardiomyopathy, CAD and Previous                  Myocardial Infarction, Pacemaker and Defibrillator, Stroke,                  Arrythmias:Atrial Fibrillation and Bradycardia,                  Signs/Symptoms:Syncope and Dyspnea; Risk Factors:Hypertension                  and Diabetes. AAA, CKD.  Sonographer:     Mikki Harbor Referring Phys:  [id number] BRITTON L RUST-CHESTER Diagnosing Phys: Rozell Searing Custovic IMPRESSIONS  1. Left ventricular ejection fraction, by estimation, is <20%. Left ventricular ejection fraction by 2D MOD biplane is 23.9 %. Left ventricular ejection fraction by PLAX is 20 %. The left ventricle has severely decreased function. The left ventricle demonstrates global hypokinesis. The left ventricular internal cavity size was moderately to severely dilated. There is mild left ventricular hypertrophy. Left ventricular diastolic  parameters are consistent with Grade III diastolic dysfunction (restrictive).  2. Severe RV pressure and volume overload. RVSP falsely low due to equalization of pressures. Right ventricular systolic function is severely reduced. The right ventricular size is severely enlarged. There is normal pulmonary artery systolic pressure. The estimated right ventricular systolic pressure is 34.0 mmHg.  3. Left atrial size was severely dilated.  4. Right atrial size was severely dilated.  5. The mitral valve is degenerative. Moderate to severe mitral valve regurgitation.  6. The tricuspid valve is degenerative. Tricuspid valve regurgitation is severe.  7. The aortic valve is grossly normal. Aortic valve regurgitation is mild. Aortic valve sclerosis is present, with no evidence of aortic valve stenosis. FINDINGS  Left Ventricle: Left ventricular ejection fraction, by estimation, is <20%. Left ventricular ejection fraction by PLAX is 20 %. Left ventricular ejection fraction by 2D MOD biplane is 23.9 %. The left ventricle has severely decreased function. The left ventricle demonstrates global hypokinesis. The left ventricular internal cavity size was moderately to severely dilated. There is mild left ventricular hypertrophy. Left ventricular diastolic parameters are consistent with Grade III diastolic dysfunction  (restrictive). Right Ventricle: Severe RV pressure and volume overload. RVSP falsely low due to equalization of pressures. The right ventricular size is severely enlarged. No increase in right ventricular wall thickness. Right ventricular systolic function is severely reduced. There is normal pulmonary artery systolic pressure. The tricuspid regurgitant velocity is 2.18 m/s, and with an assumed right atrial pressure of 15 mmHg, the estimated right ventricular systolic pressure is 34.0 mmHg. Left Atrium: Left atrial size was severely dilated. Right Atrium: Right  PROGRESS NOTE    Taylor [name]   WUJ:[id number] DOB: 09/23/1941  DOA: 09/10/2023 Date of Service: 09/14/23 which is hospital day 4  PCP: Malva Limes, MD    HPI: Taylor [name] is a 82 y.o. male past medical history significant for HFrEF (< 20%), CKD stage IV, hypertension, hyperlipidemia, diabetes, COPD on chronic home 2 L, CAD, history of atrial fibrillation on Eliquis, who presents to ED from home via EMS for fall x3. Multiple falls past month. Concern for low BP. No LOC.   Pertinent recent tx/imaging: 10/04: visit ED due to fall - (+)minimally displaced and comminuted radial styloid fracture --> splint to left forearm. (+)fx nasal bones and L maxilla.   10/10: ENT visit, discuss repair options for facial fx, not interested, conservative mgt.   Hospital course / significant events:  10/16: to ED. (+)sepsis, severe - hypothermic Tmin 63F, low BP 80s/60s, AKI, hyperkalemic K max 6.8 improved w/ tx, Cr on admission 5.46 above baseline 2.7, lactic max 3.8. CT (+)PNA RML and LLL, mod R pleural effusion also noted on previous CT. Tx midodrine, steroids, fluids, vanc/cefepime.  10/17: BP improved, renal fxn and K improved, Echo LVEF <20%, severely decreased fxn and global hypokinesis, grade III diastolic dysfunction, severe RV overload, severe RA and LA dilation. Renal US pending read. Palliative care consult, see note. Cardiology consult 10/18: stable off pressors, transfer to progressive unit. Continue to monitor renal/cardiac function closely.  10/19: no improvement in renal function, UOP improved, continue to monitor.  10/20: some improved renal fxn, continued chest pain, agrees for DNR/DNI see A/P below.   Consultants:  Nephrology P[facility] Palliative care  Cardiology, advanced HF team   Procedures/Surgeries: none      ASSESSMENT & PLAN:    INFECTIOUS   Severe sepsis due to HCAP on CT, RML, LLL. POA (+)MRSA screen, pending cultures  Neg COVID and resp viral PCR  meets  criteria for severe sepsis with WBC 13.9, hypothermia with temperature 92.1.  Lactic acid is elevated at 3.8 --> 2.9. Patient has been persistently hypotensive. admit to SDU as inp Vancomycin and cefepime initiated 10/16 and patient received 1 dose of Levaquin in ED. MRSA screen (+), continue vancomycin  Blood and sputum cultures, urine antigen of Legionella and strep IV fluid: total of 1.75 L of NS, then 150 mEq of sodium bicarbonate at 75 cc/h - caution w/ Hx CHF Midodrine Phenylephrine d/c this morning - monitor and low threshold to restart pressors  Hypothermia - resolved Bair hugger Tx sepsis as above     RENAL  Acute renal failure superimposed on stage 4 chronic kidney disease Hyperkalemia and hyperphosphatemia likely as result of AKI  creatinine 5.46, BUN 93 and GFR 10 (recent baseline creatinine 2.82 and GFR 22 on 10//24) --> Cr 5.17 today 09/13/23  Ddx include UTI, dehydration and continuation of diuretics.  ATN is possible due to hypotension. Question hepatorenal syndrome.   Nephrology and cardiology following  holding potassium and spironolactone Holding diuretics Hold nephrotoxic rx  Follow UA --> no UTI, (+)hyaline casts, (+)micro hematuria Renal US --> no concerns Poor candidate for long term dialysis   Hyperkalemia:  Potassium 6.8 --> 4.4. Likely d/t AKI/ATN pt was treated with 1 g calcium gluconate, 5 units of NovoLog, 50 cc of D50, 50 mEq of sodium bicarbonate in ED S/p Patiromer 16.8 g  Nephrology following  consulted pharmacist for electrolytes monitoring  Hyponatremia likely d/t AKI Monitor BMP Treat underlying causes    CARDIAC  Chronic  70.0 in Accession #:    [id number]   Weight:       158.3 lb Date of Birth:  1941/08/28   BSA:          1.890 m Patient Age:    81 years     BP:           103/69 mmHg Patient Gender: M            HR:           62 bpm. Exam Location:  [facility] Procedure: 2D Echo, 3D Echo, Cardiac Doppler and Color Doppler Indications:     Syncope  History:         Patient has prior history of Echocardiogram examinations, most                  recent 10/30/2022. CHF and Cardiomyopathy, CAD and Previous                  Myocardial Infarction, Pacemaker and Defibrillator, Stroke,                  Arrythmias:Atrial Fibrillation and Bradycardia,                  Signs/Symptoms:Syncope and Dyspnea; Risk Factors:Hypertension                  and Diabetes. AAA, CKD.  Sonographer:     Mikki Harbor Referring Phys:  [id number] BRITTON L RUST-CHESTER Diagnosing Phys: Rozell Searing Custovic IMPRESSIONS  1. Left ventricular ejection fraction, by estimation, is <20%. Left ventricular ejection fraction by 2D MOD biplane is 23.9 %. Left ventricular ejection fraction by PLAX is 20 %. The left ventricle has severely decreased function. The left ventricle demonstrates global hypokinesis. The left ventricular internal cavity size was moderately to severely dilated. There is mild left ventricular hypertrophy. Left ventricular diastolic  parameters are consistent with Grade III diastolic dysfunction (restrictive).  2. Severe RV pressure and volume overload. RVSP falsely low due to equalization of pressures. Right ventricular systolic function is severely reduced. The right ventricular size is severely enlarged. There is normal pulmonary artery systolic pressure. The estimated right ventricular systolic pressure is 34.0 mmHg.  3. Left atrial size was severely dilated.  4. Right atrial size was severely dilated.  5. The mitral valve is degenerative. Moderate to severe mitral valve regurgitation.  6. The tricuspid valve is degenerative. Tricuspid valve regurgitation is severe.  7. The aortic valve is grossly normal. Aortic valve regurgitation is mild. Aortic valve sclerosis is present, with no evidence of aortic valve stenosis. FINDINGS  Left Ventricle: Left ventricular ejection fraction, by estimation, is <20%. Left ventricular ejection fraction by PLAX is 20 %. Left ventricular ejection fraction by 2D MOD biplane is 23.9 %. The left ventricle has severely decreased function. The left ventricle demonstrates global hypokinesis. The left ventricular internal cavity size was moderately to severely dilated. There is mild left ventricular hypertrophy. Left ventricular diastolic parameters are consistent with Grade III diastolic dysfunction  (restrictive). Right Ventricle: Severe RV pressure and volume overload. RVSP falsely low due to equalization of pressures. The right ventricular size is severely enlarged. No increase in right ventricular wall thickness. Right ventricular systolic function is severely reduced. There is normal pulmonary artery systolic pressure. The tricuspid regurgitant velocity is 2.18 m/s, and with an assumed right atrial pressure of 15 mmHg, the estimated right ventricular systolic pressure is 34.0 mmHg. Left Atrium: Left atrial size was severely dilated. Right Atrium: Right  70.0 in Accession #:    [id number]   Weight:       158.3 lb Date of Birth:  1941/08/28   BSA:          1.890 m Patient Age:    81 years     BP:           103/69 mmHg Patient Gender: M            HR:           62 bpm. Exam Location:  [facility] Procedure: 2D Echo, 3D Echo, Cardiac Doppler and Color Doppler Indications:     Syncope  History:         Patient has prior history of Echocardiogram examinations, most                  recent 10/30/2022. CHF and Cardiomyopathy, CAD and Previous                  Myocardial Infarction, Pacemaker and Defibrillator, Stroke,                  Arrythmias:Atrial Fibrillation and Bradycardia,                  Signs/Symptoms:Syncope and Dyspnea; Risk Factors:Hypertension                  and Diabetes. AAA, CKD.  Sonographer:     Mikki Harbor Referring Phys:  [id number] BRITTON L RUST-CHESTER Diagnosing Phys: Rozell Searing Custovic IMPRESSIONS  1. Left ventricular ejection fraction, by estimation, is <20%. Left ventricular ejection fraction by 2D MOD biplane is 23.9 %. Left ventricular ejection fraction by PLAX is 20 %. The left ventricle has severely decreased function. The left ventricle demonstrates global hypokinesis. The left ventricular internal cavity size was moderately to severely dilated. There is mild left ventricular hypertrophy. Left ventricular diastolic  parameters are consistent with Grade III diastolic dysfunction (restrictive).  2. Severe RV pressure and volume overload. RVSP falsely low due to equalization of pressures. Right ventricular systolic function is severely reduced. The right ventricular size is severely enlarged. There is normal pulmonary artery systolic pressure. The estimated right ventricular systolic pressure is 34.0 mmHg.  3. Left atrial size was severely dilated.  4. Right atrial size was severely dilated.  5. The mitral valve is degenerative. Moderate to severe mitral valve regurgitation.  6. The tricuspid valve is degenerative. Tricuspid valve regurgitation is severe.  7. The aortic valve is grossly normal. Aortic valve regurgitation is mild. Aortic valve sclerosis is present, with no evidence of aortic valve stenosis. FINDINGS  Left Ventricle: Left ventricular ejection fraction, by estimation, is <20%. Left ventricular ejection fraction by PLAX is 20 %. Left ventricular ejection fraction by 2D MOD biplane is 23.9 %. The left ventricle has severely decreased function. The left ventricle demonstrates global hypokinesis. The left ventricular internal cavity size was moderately to severely dilated. There is mild left ventricular hypertrophy. Left ventricular diastolic parameters are consistent with Grade III diastolic dysfunction  (restrictive). Right Ventricle: Severe RV pressure and volume overload. RVSP falsely low due to equalization of pressures. The right ventricular size is severely enlarged. No increase in right ventricular wall thickness. Right ventricular systolic function is severely reduced. There is normal pulmonary artery systolic pressure. The tricuspid regurgitant velocity is 2.18 m/s, and with an assumed right atrial pressure of 15 mmHg, the estimated right ventricular systolic pressure is 34.0 mmHg. Left Atrium: Left atrial size was severely dilated. Right Atrium: Right  PROGRESS NOTE    Taylor [name]   WUJ:[id number] DOB: 09/23/1941  DOA: 09/10/2023 Date of Service: 09/14/23 which is hospital day 4  PCP: Malva Limes, MD    HPI: Taylor [name] is a 82 y.o. male past medical history significant for HFrEF (< 20%), CKD stage IV, hypertension, hyperlipidemia, diabetes, COPD on chronic home 2 L, CAD, history of atrial fibrillation on Eliquis, who presents to ED from home via EMS for fall x3. Multiple falls past month. Concern for low BP. No LOC.   Pertinent recent tx/imaging: 10/04: visit ED due to fall - (+)minimally displaced and comminuted radial styloid fracture --> splint to left forearm. (+)fx nasal bones and L maxilla.   10/10: ENT visit, discuss repair options for facial fx, not interested, conservative mgt.   Hospital course / significant events:  10/16: to ED. (+)sepsis, severe - hypothermic Tmin 63F, low BP 80s/60s, AKI, hyperkalemic K max 6.8 improved w/ tx, Cr on admission 5.46 above baseline 2.7, lactic max 3.8. CT (+)PNA RML and LLL, mod R pleural effusion also noted on previous CT. Tx midodrine, steroids, fluids, vanc/cefepime.  10/17: BP improved, renal fxn and K improved, Echo LVEF <20%, severely decreased fxn and global hypokinesis, grade III diastolic dysfunction, severe RV overload, severe RA and LA dilation. Renal US pending read. Palliative care consult, see note. Cardiology consult 10/18: stable off pressors, transfer to progressive unit. Continue to monitor renal/cardiac function closely.  10/19: no improvement in renal function, UOP improved, continue to monitor.  10/20: some improved renal fxn, continued chest pain, agrees for DNR/DNI see A/P below.   Consultants:  Nephrology PCCU Palliative care  Cardiology, advanced HF team   Procedures/Surgeries: none      ASSESSMENT & PLAN:    INFECTIOUS   Severe sepsis due to HCAP on CT, RML, LLL. POA (+)MRSA screen, pending cultures  Neg COVID and resp viral PCR  meets  criteria for severe sepsis with WBC 13.9, hypothermia with temperature 92.1.  Lactic acid is elevated at 3.8 --> 2.9. Patient has been persistently hypotensive. admit to SDU as inp Vancomycin and cefepime initiated 10/16 and patient received 1 dose of Levaquin in ED. MRSA screen (+), continue vancomycin  Blood and sputum cultures, urine antigen of Legionella and strep IV fluid: total of 1.75 L of NS, then 150 mEq of sodium bicarbonate at 75 cc/h - caution w/ Hx CHF Midodrine Phenylephrine d/c this morning - monitor and low threshold to restart pressors  Hypothermia - resolved Bair hugger Tx sepsis as above     RENAL  Acute renal failure superimposed on stage 4 chronic kidney disease Hyperkalemia and hyperphosphatemia likely as result of AKI  creatinine 5.46, BUN 93 and GFR 10 (recent baseline creatinine 2.82 and GFR 22 on 10//24) --> Cr 5.17 today 09/13/23  Ddx include UTI, dehydration and continuation of diuretics.  ATN is possible due to hypotension. Question hepatorenal syndrome.   Nephrology and cardiology following  holding potassium and spironolactone Holding diuretics Hold nephrotoxic rx  Follow UA --> no UTI, (+)hyaline casts, (+)micro hematuria Renal US --> no concerns Poor candidate for long term dialysis   Hyperkalemia:  Potassium 6.8 --> 4.4. Likely d/t AKI/ATN pt was treated with 1 g calcium gluconate, 5 units of NovoLog, 50 cc of D50, 50 mEq of sodium bicarbonate in ED S/p Patiromer 16.8 g  Nephrology following  consulted pharmacist for electrolytes monitoring  Hyponatremia likely d/t AKI Monitor BMP Treat underlying causes    CARDIAC  Chronic

## 2023-09-14 NOTE — Progress Notes (Signed)
Persistent chest pain. On nitrog gtt, titrating up, needing to transfer to SDU d/t rate of the drip. D/w Dr Melton Alar - still would not take to cath lab d/t renal function and this would almost certainly compromise kidneys such that dialysis / failure may be inevitable and he is poor candidate for HD. Will not get troponin/EKG as will not change outcome/therapy.  I saw patient at bedside 6:15 PM - discussed w/ him and wife that this type of pain likely means heart attack / muscle death and worsening heart failure or complete arrest might be imminent. He asks if this pain might be due to a rib injury, I clearly states no it is due to his heart. There is a chance the nitroglycerin will help and he will feel better, in fact he reports pain is controlled at this time, but I let him know this relief may be temporary. I presented option for comfort measures. I explained that this would involve giving morphine to ease/eliminate pain and shortness of breath, and anti-anxiety medications, we do not give lethal doses of these medicines.   They would like to not pursue comfort measures at this time. I explained that if he were to quickly decompensate we would try to administer morphine ASAP to help with pain and he is agreeable to this.   Critical care time spent 40 min

## 2023-09-14 NOTE — Progress Notes (Signed)
[facility] CLINIC CARDIOLOGY Progress NOTE       Patient ID: Taylor [name] MRN: [id number] DOB/AGE: 1941-01-29 82 y.o.  Admit date: 09/10/2023 Referring Physician Dr.  Valera Castle Physician Dr. Zada Girt Primary Cardiologist Dr. Marcina Millard Reason for Consultation Chronic Heart Failure, EF < 20%  HPI: Taylor [name] is a 82 y.o. male  with a past medical history of paroxysmal atrial fibrillation (Eliquis), chronic HFrEF (EF < 20%), s/p CABG (08/2006), s/p dual-chamber ICD (2014), SSS, HTN, HLD, type 2 diabetes, pancreatitis, ischemic cardiomyopathy, ventricular fibrillation arrest (1994) s/p MI infarction, bilateral carotid artery stenosis s/p left carotid stent (06/2022), abdominal aortic aneurysm s/p endovascular repair (2008), CKD stage 4, COPD (baseline 2L ) liver cirrhosis with ascites (CT 08/2023), who presented to the ED on 09/10/2023 for frequent falls and generalized weakness. Cardiology was consulted for further evaluation.   Interval History: - Patient seen and examined at bedside, resting comfortably. Cardiology asked to reassess due to 8/10 chest pain this morning. Patient is sleeping comfortably and denies chest pain at the time of my visit. He has no complaints or concerns at this time. Denies chest pain, shortness of breath, palpitations, diaphoresis, syncope, edema, PND, orthopnea.   Review of systems complete and found to be negative unless listed above    Past Medical History:  Diagnosis Date   AAA (abdominal aortic aneurysm) (HCC) 06/03/2007   [facility]; Dr. Hart Rochester   AICD (automatic cardioverter/defibrillator) present    Arrhythmia    atrial fibrillation   Barrett's esophagus    Bladder cancer (HCC)    Bradycardia    CAD (coronary artery disease)    CAP (community acquired pneumonia) 11/13/2019   CHF (congestive heart failure) (HCC)    Cluster headache    COVID-19 09/27/2021   DDD (degenerative disc disease), lumbar    Diabetes  mellitus without complication (HCC)    Dyspnea    WITH EXERTION   Edema    LEFT ANKLE   Fracture of skull base (HCC) 1997   due to fall   GERD (gastroesophageal reflux disease)    Gout    History of bladder cancer 12/1995   Hyperlipidemia    Hypertension    Hypocalcemia 04/19/2020   Possibly secondary to diuretics.   Low=5.9 04/19/2020   Malignant melanoma (HCC) 12/2012   right dorsal forearm excised   Myocardial infarction [facility])    LAST 2014   Osteoarthritis of knee    Other specified complication of vascular prosthetic devices, implants and grafts, initial encounter (HCC) 08/22/2021   Pacemaker 10/10/2006   Pancreatitis 11/22/2019   Pneumonia    2016   Pre-diabetes    Psoriasis    Rib fracture 1997   due to fall   Sleep apnea    CPAP   Stroke [facility])    Venous incompetence     Past Surgical History:  Procedure Laterality Date   ABDOMINAL AORTIC ANEURYSM REPAIR  06/03/2007   [facility]; Dr. Hart [place]   ANGIOPLASTY  1994   [place]   BLADDER TUMOR EXCISION  12/1995   CAROTID PTA/STENT INTERVENTION Left 07/23/2022   Procedure: CAROTID PTA/STENT INTERVENTION;  Surgeon: Renford Dills, MD;  Location: [facility] INVASIVE CV LAB;  Service: Cardiovascular;  Laterality: Left;   CATARACT EXTRACTION W/PHACO Left 10/22/2016   Procedure: CATARACT EXTRACTION PHACO AND INTRAOCULAR LENS PLACEMENT (IOC);  Surgeon: Galen Manila, MD;  Location: [facility] ORS;  Service: Ophthalmology;  Laterality: Left;  [place] 47.7 AP% 18.4 CDE 8.78 Fluid pack  [facility] CLINIC CARDIOLOGY Progress NOTE       Patient ID: Taylor [name] MRN: [id number] DOB/AGE: 1941-01-29 82 y.o.  Admit date: 09/10/2023 Referring Physician Dr.  Valera Castle Physician Dr. Zada Girt Primary Cardiologist Dr. Marcina Millard Reason for Consultation Chronic Heart Failure, EF < 20%  HPI: Taylor [name] is a 82 y.o. male  with a past medical history of paroxysmal atrial fibrillation (Eliquis), chronic HFrEF (EF < 20%), s/p CABG (08/2006), s/p dual-chamber ICD (2014), SSS, HTN, HLD, type 2 diabetes, pancreatitis, ischemic cardiomyopathy, ventricular fibrillation arrest (1994) s/p MI infarction, bilateral carotid artery stenosis s/p left carotid stent (06/2022), abdominal aortic aneurysm s/p endovascular repair (2008), CKD stage 4, COPD (baseline 2L ) liver cirrhosis with ascites (CT 08/2023), who presented to the ED on 09/10/2023 for frequent falls and generalized weakness. Cardiology was consulted for further evaluation.   Interval History: - Patient seen and examined at bedside, resting comfortably. Cardiology asked to reassess due to 8/10 chest pain this morning. Patient is sleeping comfortably and denies chest pain at the time of my visit. He has no complaints or concerns at this time. Denies chest pain, shortness of breath, palpitations, diaphoresis, syncope, edema, PND, orthopnea.   Review of systems complete and found to be negative unless listed above    Past Medical History:  Diagnosis Date   AAA (abdominal aortic aneurysm) (HCC) 06/03/2007   [facility]; Dr. Hart [place]   AICD (automatic cardioverter/defibrillator) present    Arrhythmia    atrial fibrillation   Barrett's esophagus    Bladder cancer (HCC)    Bradycardia    CAD (coronary artery disease)    CAP (community acquired pneumonia) 11/13/2019   CHF (congestive heart failure) (HCC)    Cluster headache    COVID-19 09/27/2021   DDD (degenerative disc disease), lumbar    Diabetes  mellitus without complication (HCC)    Dyspnea    WITH EXERTION   Edema    LEFT ANKLE   Fracture of skull base (HCC) 1997   due to fall   GERD (gastroesophageal reflux disease)    Gout    History of bladder cancer 12/1995   Hyperlipidemia    Hypertension    Hypocalcemia 04/19/2020   Possibly secondary to diuretics.   Low=5.9 04/19/2020   Malignant melanoma (HCC) 12/2012   right dorsal forearm excised   Myocardial infarction [facility])    LAST 2014   Osteoarthritis of knee    Other specified complication of vascular prosthetic devices, implants and grafts, initial encounter (HCC) 08/22/2021   Pacemaker 10/10/2006   Pancreatitis 11/22/2019   Pneumonia    2016   Pre-diabetes    Psoriasis    Rib fracture 1997   due to fall   Sleep apnea    CPAP   Stroke [facility])    Venous incompetence     Past Surgical History:  Procedure Laterality Date   ABDOMINAL AORTIC ANEURYSM REPAIR  06/03/2007   [facility]; Dr. Hart Rochester   ANGIOPLASTY  1994   [place]   BLADDER TUMOR EXCISION  12/1995   CAROTID PTA/STENT INTERVENTION Left 07/23/2022   Procedure: CAROTID PTA/STENT INTERVENTION;  Surgeon: Renford Dills, MD;  Location: [facility] INVASIVE CV LAB;  Service: Cardiovascular;  Laterality: Left;   CATARACT EXTRACTION W/PHACO Left 10/22/2016   Procedure: CATARACT EXTRACTION PHACO AND INTRAOCULAR LENS PLACEMENT (IOC);  Surgeon: Galen Manila, MD;  Location: [facility] ORS;  Service: Ophthalmology;  Laterality: Left;  [place] 47.7 AP% 18.4 CDE 8.78 Fluid pack  strength and tone for age. Extremities: Warm and well perfused. No clubbing, cyanosis. No edema.  Neuro: Alert and oriented X 3. Psych: Answers  questions appropriately.   Labs: Basic Metabolic Panel: Recent Labs    09/12/23 0500 09/13/23 0251 09/14/23 0313  NA 131* 135 137  K 4.4 4.0 3.9  CL 90* 91* 96*  CO2 26 27 29   GLUCOSE 142* 127* 123*  BUN 104* 108* 97*  CREATININE 5.17* 5.17* 4.10*  CALCIUM 8.8* 8.7* 8.9  MG 2.3 2.2  --   PHOS 7.0* 5.8*  --    Liver Function Tests: No results for input(s): "AST", "ALT", "ALKPHOS", "BILITOT", "PROT", "ALBUMIN" in the last 72 hours.  No results for input(s): "LIPASE", "AMYLASE" in the last 72 hours. CBC: Recent Labs    09/12/23 0500  WBC 9.9  HGB 11.1*  HCT 35.0*  MCV 97.0  PLT 152   Cardiac Enzymes: No results for input(s): "CKTOTAL", "CKMB", "CKMBINDEX", "TROPONINIHS" in the last 72 hours.  BNP: No results for input(s): "BNP" in the last 72 hours.  D-Dimer: No results for input(s): "DDIMER" in the last 72 hours. Hemoglobin A1C: No results for input(s): "HGBA1C" in the last 72 hours.  Fasting Lipid Panel: No results for input(s): "CHOL", "HDL", "LDLCALC", "TRIG", "CHOLHDL", "LDLDIRECT" in the last 72 hours.  Thyroid Function Tests: No results for input(s): "TSH", "T4TOTAL", "T3FREE", "THYROIDAB" in the last 72 hours.  Invalid input(s): "FREET3"  Anemia Panel: No results for input(s): "VITAMINB12", "FOLATE", "FERRITIN", "TIBC", "IRON", "RETICCTPCT" in the last 72 hours.   Radiology: US RENAL  Result Date: 09/11/2023 CLINICAL DATA:  Acute on chronic renal failure EXAM: RENAL / URINARY TRACT ULTRASOUND COMPLETE COMPARISON:  07/29/2023.  CT 09/10/2023 FINDINGS: Right Kidney: Renal measurements: 10.3 x 6.2 x 5.8 cm = volume: 192 mL. Echogenicity within normal limits. No mass or hydronephrosis visualized. Left Kidney: Renal measurements: 7.0 x 3.5 x 2.8 cm = volume: 37 mL. Atrophic with cortical thinning. No mass or hydronephrosis. Bladder: Decompressed with Foley catheter in place. Other: Moderate  ascites in the abdomen and pelvis. IMPRESSION: No acute findings.  Atrophic left kidney, stable. Moderate ascites. Electronically Signed   By: Charlett Nose M.D.   On: 09/11/2023 19:18   ECHOCARDIOGRAM COMPLETE  Result Date: 09/11/2023    ECHOCARDIOGRAM REPORT   Patient Name:   CHANCELER KOLB Date of Exam: 09/11/2023 Medical Rec #:  [id number]    Height:       70.0 in Accession #:    [id number]   Weight:       158.3 lb Date of Birth:  06/24/1941   BSA:          1.890 m Patient Age:    81 years     BP:           103/69 mmHg Patient Gender: M            HR:           62 bpm. Exam Location:  [facility] Procedure: 2D Echo, 3D Echo, Cardiac Doppler and Color Doppler Indications:     Syncope  History:         Patient has prior history of Echocardiogram examinations, most                  recent 10/30/2022. CHF and Cardiomyopathy, CAD and Previous                  Myocardial Infarction, Pacemaker and Defibrillator, Stroke,  strength and tone for age. Extremities: Warm and well perfused. No clubbing, cyanosis. No edema.  Neuro: Alert and oriented X 3. Psych: Answers  questions appropriately.   Labs: Basic Metabolic Panel: Recent Labs    09/12/23 0500 09/13/23 0251 09/14/23 0313  NA 131* 135 137  K 4.4 4.0 3.9  CL 90* 91* 96*  CO2 26 27 29   GLUCOSE 142* 127* 123*  BUN 104* 108* 97*  CREATININE 5.17* 5.17* 4.10*  CALCIUM 8.8* 8.7* 8.9  MG 2.3 2.2  --   PHOS 7.0* 5.8*  --    Liver Function Tests: No results for input(s): "AST", "ALT", "ALKPHOS", "BILITOT", "PROT", "ALBUMIN" in the last 72 hours.  No results for input(s): "LIPASE", "AMYLASE" in the last 72 hours. CBC: Recent Labs    09/12/23 0500  WBC 9.9  HGB 11.1*  HCT 35.0*  MCV 97.0  PLT 152   Cardiac Enzymes: No results for input(s): "CKTOTAL", "CKMB", "CKMBINDEX", "TROPONINIHS" in the last 72 hours.  BNP: No results for input(s): "BNP" in the last 72 hours.  D-Dimer: No results for input(s): "DDIMER" in the last 72 hours. Hemoglobin A1C: No results for input(s): "HGBA1C" in the last 72 hours.  Fasting Lipid Panel: No results for input(s): "CHOL", "HDL", "LDLCALC", "TRIG", "CHOLHDL", "LDLDIRECT" in the last 72 hours.  Thyroid Function Tests: No results for input(s): "TSH", "T4TOTAL", "T3FREE", "THYROIDAB" in the last 72 hours.  Invalid input(s): "FREET3"  Anemia Panel: No results for input(s): "VITAMINB12", "FOLATE", "FERRITIN", "TIBC", "IRON", "RETICCTPCT" in the last 72 hours.   Radiology: US RENAL  Result Date: 09/11/2023 CLINICAL DATA:  Acute on chronic renal failure EXAM: RENAL / URINARY TRACT ULTRASOUND COMPLETE COMPARISON:  07/29/2023.  CT 09/10/2023 FINDINGS: Right Kidney: Renal measurements: 10.3 x 6.2 x 5.8 cm = volume: 192 mL. Echogenicity within normal limits. No mass or hydronephrosis visualized. Left Kidney: Renal measurements: 7.0 x 3.5 x 2.8 cm = volume: 37 mL. Atrophic with cortical thinning. No mass or hydronephrosis. Bladder: Decompressed with Foley catheter in place. Other: Moderate  ascites in the abdomen and pelvis. IMPRESSION: No acute findings.  Atrophic left kidney, stable. Moderate ascites. Electronically Signed   By: Charlett Nose M.D.   On: 09/11/2023 19:18   ECHOCARDIOGRAM COMPLETE  Result Date: 09/11/2023    ECHOCARDIOGRAM REPORT   Patient Name:   CHANCELER KOLB Date of Exam: 09/11/2023 Medical Rec #:  [id number]    Height:       70.0 in Accession #:    [id number]   Weight:       158.3 lb Date of Birth:  06/24/1941   BSA:          1.890 m Patient Age:    81 years     BP:           103/69 mmHg Patient Gender: M            HR:           62 bpm. Exam Location:  [facility] Procedure: 2D Echo, 3D Echo, Cardiac Doppler and Color Doppler Indications:     Syncope  History:         Patient has prior history of Echocardiogram examinations, most                  recent 10/30/2022. CHF and Cardiomyopathy, CAD and Previous                  Myocardial Infarction, Pacemaker and Defibrillator, Stroke,  [facility] CLINIC CARDIOLOGY Progress NOTE       Patient ID: Taylor [name] MRN: [id number] DOB/AGE: 1941-01-29 82 y.o.  Admit date: 09/10/2023 Referring Physician Dr.  Valera Castle Physician Dr. Zada Girt Primary Cardiologist Dr. Marcina Millard Reason for Consultation Chronic Heart Failure, EF < 20%  HPI: Taylor [name] is a 82 y.o. male  with a past medical history of paroxysmal atrial fibrillation (Eliquis), chronic HFrEF (EF < 20%), s/p CABG (08/2006), s/p dual-chamber ICD (2014), SSS, HTN, HLD, type 2 diabetes, pancreatitis, ischemic cardiomyopathy, ventricular fibrillation arrest (1994) s/p MI infarction, bilateral carotid artery stenosis s/p left carotid stent (06/2022), abdominal aortic aneurysm s/p endovascular repair (2008), CKD stage 4, COPD (baseline 2L ) liver cirrhosis with ascites (CT 08/2023), who presented to the ED on 09/10/2023 for frequent falls and generalized weakness. Cardiology was consulted for further evaluation.   Interval History: - Patient seen and examined at bedside, resting comfortably. Cardiology asked to reassess due to 8/10 chest pain this morning. Patient is sleeping comfortably and denies chest pain at the time of my visit. He has no complaints or concerns at this time. Denies chest pain, shortness of breath, palpitations, diaphoresis, syncope, edema, PND, orthopnea.   Review of systems complete and found to be negative unless listed above    Past Medical History:  Diagnosis Date   AAA (abdominal aortic aneurysm) (HCC) 06/03/2007   [facility]; Dr. Hart [place]   AICD (automatic cardioverter/defibrillator) present    Arrhythmia    atrial fibrillation   Barrett's esophagus    Bladder cancer (HCC)    Bradycardia    CAD (coronary artery disease)    CAP (community acquired pneumonia) 11/13/2019   CHF (congestive heart failure) (HCC)    Cluster headache    COVID-19 09/27/2021   DDD (degenerative disc disease), lumbar    Diabetes  mellitus without complication (HCC)    Dyspnea    WITH EXERTION   Edema    LEFT ANKLE   Fracture of skull base (HCC) 1997   due to fall   GERD (gastroesophageal reflux disease)    Gout    History of bladder cancer 12/1995   Hyperlipidemia    Hypertension    Hypocalcemia 04/19/2020   Possibly secondary to diuretics.   Low=5.9 04/19/2020   Malignant melanoma (HCC) 12/2012   right dorsal forearm excised   Myocardial infarction [facility])    LAST 2014   Osteoarthritis of knee    Other specified complication of vascular prosthetic devices, implants and grafts, initial encounter (HCC) 08/22/2021   Pacemaker 10/10/2006   Pancreatitis 11/22/2019   Pneumonia    2016   Pre-diabetes    Psoriasis    Rib fracture 1997   due to fall   Sleep apnea    CPAP   Stroke [facility])    Venous incompetence     Past Surgical History:  Procedure Laterality Date   ABDOMINAL AORTIC ANEURYSM REPAIR  06/03/2007   [facility]; Dr. Hart Rochester   ANGIOPLASTY  1994   [place]   BLADDER TUMOR EXCISION  12/1995   CAROTID PTA/STENT INTERVENTION Left 07/23/2022   Procedure: CAROTID PTA/STENT INTERVENTION;  Surgeon: Renford Dills, MD;  Location: [facility] INVASIVE CV LAB;  Service: Cardiovascular;  Laterality: Left;   CATARACT EXTRACTION W/PHACO Left 10/22/2016   Procedure: CATARACT EXTRACTION PHACO AND INTRAOCULAR LENS PLACEMENT (IOC);  Surgeon: Galen Manila, MD;  Location: [facility] ORS;  Service: Ophthalmology;  Laterality: Left;  [place] 47.7 AP% 18.4 CDE 8.78 Fluid pack  [facility] CLINIC CARDIOLOGY Progress NOTE       Patient ID: Taylor [name] MRN: [id number] DOB/AGE: 1941-01-29 82 y.o.  Admit date: 09/10/2023 Referring Physician Dr.  Valera Castle Physician Dr. Zada Girt Primary Cardiologist Dr. Marcina Millard Reason for Consultation Chronic Heart Failure, EF < 20%  HPI: Taylor [name] is a 82 y.o. male  with a past medical history of paroxysmal atrial fibrillation (Eliquis), chronic HFrEF (EF < 20%), s/p CABG (08/2006), s/p dual-chamber ICD (2014), SSS, HTN, HLD, type 2 diabetes, pancreatitis, ischemic cardiomyopathy, ventricular fibrillation arrest (1994) s/p MI infarction, bilateral carotid artery stenosis s/p left carotid stent (06/2022), abdominal aortic aneurysm s/p endovascular repair (2008), CKD stage 4, COPD (baseline 2L ) liver cirrhosis with ascites (CT 08/2023), who presented to the ED on 09/10/2023 for frequent falls and generalized weakness. Cardiology was consulted for further evaluation.   Interval History: - Patient seen and examined at bedside, resting comfortably. Cardiology asked to reassess due to 8/10 chest pain this morning. Patient is sleeping comfortably and denies chest pain at the time of my visit. He has no complaints or concerns at this time. Denies chest pain, shortness of breath, palpitations, diaphoresis, syncope, edema, PND, orthopnea.   Review of systems complete and found to be negative unless listed above    Past Medical History:  Diagnosis Date   AAA (abdominal aortic aneurysm) (HCC) 06/03/2007   [facility]; Dr. Hart [place]   AICD (automatic cardioverter/defibrillator) present    Arrhythmia    atrial fibrillation   Barrett's esophagus    Bladder cancer (HCC)    Bradycardia    CAD (coronary artery disease)    CAP (community acquired pneumonia) 11/13/2019   CHF (congestive heart failure) (HCC)    Cluster headache    COVID-19 09/27/2021   DDD (degenerative disc disease), lumbar    Diabetes  mellitus without complication (HCC)    Dyspnea    WITH EXERTION   Edema    LEFT ANKLE   Fracture of skull base (HCC) 1997   due to fall   GERD (gastroesophageal reflux disease)    Gout    History of bladder cancer 12/1995   Hyperlipidemia    Hypertension    Hypocalcemia 04/19/2020   Possibly secondary to diuretics.   Low=5.9 04/19/2020   Malignant melanoma (HCC) 12/2012   right dorsal forearm excised   Myocardial infarction [facility])    LAST 2014   Osteoarthritis of knee    Other specified complication of vascular prosthetic devices, implants and grafts, initial encounter (HCC) 08/22/2021   Pacemaker 10/10/2006   Pancreatitis 11/22/2019   Pneumonia    2016   Pre-diabetes    Psoriasis    Rib fracture 1997   due to fall   Sleep apnea    CPAP   Stroke [facility])    Venous incompetence     Past Surgical History:  Procedure Laterality Date   ABDOMINAL AORTIC ANEURYSM REPAIR  06/03/2007   [facility]; Dr. Hart Rochester   ANGIOPLASTY  1994   [place]   BLADDER TUMOR EXCISION  12/1995   CAROTID PTA/STENT INTERVENTION Left 07/23/2022   Procedure: CAROTID PTA/STENT INTERVENTION;  Surgeon: Renford Dills, MD;  Location: [facility] INVASIVE CV LAB;  Service: Cardiovascular;  Laterality: Left;   CATARACT EXTRACTION W/PHACO Left 10/22/2016   Procedure: CATARACT EXTRACTION PHACO AND INTRAOCULAR LENS PLACEMENT (IOC);  Surgeon: Galen Manila, MD;  Location: [facility] ORS;  Service: Ophthalmology;  Laterality: Left;  [place] 47.7 AP% 18.4 CDE 8.78 Fluid pack  strength and tone for age. Extremities: Warm and well perfused. No clubbing, cyanosis. No edema.  Neuro: Alert and oriented X 3. Psych: Answers  questions appropriately.   Labs: Basic Metabolic Panel: Recent Labs    09/12/23 0500 09/13/23 0251 09/14/23 0313  NA 131* 135 137  K 4.4 4.0 3.9  CL 90* 91* 96*  CO2 26 27 29   GLUCOSE 142* 127* 123*  BUN 104* 108* 97*  CREATININE 5.17* 5.17* 4.10*  CALCIUM 8.8* 8.7* 8.9  MG 2.3 2.2  --   PHOS 7.0* 5.8*  --    Liver Function Tests: No results for input(s): "AST", "ALT", "ALKPHOS", "BILITOT", "PROT", "ALBUMIN" in the last 72 hours.  No results for input(s): "LIPASE", "AMYLASE" in the last 72 hours. CBC: Recent Labs    09/12/23 0500  WBC 9.9  HGB 11.1*  HCT 35.0*  MCV 97.0  PLT 152   Cardiac Enzymes: No results for input(s): "CKTOTAL", "CKMB", "CKMBINDEX", "TROPONINIHS" in the last 72 hours.  BNP: No results for input(s): "BNP" in the last 72 hours.  D-Dimer: No results for input(s): "DDIMER" in the last 72 hours. Hemoglobin A1C: No results for input(s): "HGBA1C" in the last 72 hours.  Fasting Lipid Panel: No results for input(s): "CHOL", "HDL", "LDLCALC", "TRIG", "CHOLHDL", "LDLDIRECT" in the last 72 hours.  Thyroid Function Tests: No results for input(s): "TSH", "T4TOTAL", "T3FREE", "THYROIDAB" in the last 72 hours.  Invalid input(s): "FREET3"  Anemia Panel: No results for input(s): "VITAMINB12", "FOLATE", "FERRITIN", "TIBC", "IRON", "RETICCTPCT" in the last 72 hours.   Radiology: US RENAL  Result Date: 09/11/2023 CLINICAL DATA:  Acute on chronic renal failure EXAM: RENAL / URINARY TRACT ULTRASOUND COMPLETE COMPARISON:  07/29/2023.  CT 09/10/2023 FINDINGS: Right Kidney: Renal measurements: 10.3 x 6.2 x 5.8 cm = volume: 192 mL. Echogenicity within normal limits. No mass or hydronephrosis visualized. Left Kidney: Renal measurements: 7.0 x 3.5 x 2.8 cm = volume: 37 mL. Atrophic with cortical thinning. No mass or hydronephrosis. Bladder: Decompressed with Foley catheter in place. Other: Moderate  ascites in the abdomen and pelvis. IMPRESSION: No acute findings.  Atrophic left kidney, stable. Moderate ascites. Electronically Signed   By: Charlett Nose M.D.   On: 09/11/2023 19:18   ECHOCARDIOGRAM COMPLETE  Result Date: 09/11/2023    ECHOCARDIOGRAM REPORT   Patient Name:   CHANCELER KOLB Date of Exam: 09/11/2023 Medical Rec #:  [id number]    Height:       70.0 in Accession #:    [id number]   Weight:       158.3 lb Date of Birth:  06/24/1941   BSA:          1.890 m Patient Age:    81 years     BP:           103/69 mmHg Patient Gender: M            HR:           62 bpm. Exam Location:  [facility] Procedure: 2D Echo, 3D Echo, Cardiac Doppler and Color Doppler Indications:     Syncope  History:         Patient has prior history of Echocardiogram examinations, most                  recent 10/30/2022. CHF and Cardiomyopathy, CAD and Previous                  Myocardial Infarction, Pacemaker and Defibrillator, Stroke,  strength and tone for age. Extremities: Warm and well perfused. No clubbing, cyanosis. No edema.  Neuro: Alert and oriented X 3. Psych: Answers  questions appropriately.   Labs: Basic Metabolic Panel: Recent Labs    09/12/23 0500 09/13/23 0251 09/14/23 0313  NA 131* 135 137  K 4.4 4.0 3.9  CL 90* 91* 96*  CO2 26 27 29   GLUCOSE 142* 127* 123*  BUN 104* 108* 97*  CREATININE 5.17* 5.17* 4.10*  CALCIUM 8.8* 8.7* 8.9  MG 2.3 2.2  --   PHOS 7.0* 5.8*  --    Liver Function Tests: No results for input(s): "AST", "ALT", "ALKPHOS", "BILITOT", "PROT", "ALBUMIN" in the last 72 hours.  No results for input(s): "LIPASE", "AMYLASE" in the last 72 hours. CBC: Recent Labs    09/12/23 0500  WBC 9.9  HGB 11.1*  HCT 35.0*  MCV 97.0  PLT 152   Cardiac Enzymes: No results for input(s): "CKTOTAL", "CKMB", "CKMBINDEX", "TROPONINIHS" in the last 72 hours.  BNP: No results for input(s): "BNP" in the last 72 hours.  D-Dimer: No results for input(s): "DDIMER" in the last 72 hours. Hemoglobin A1C: No results for input(s): "HGBA1C" in the last 72 hours.  Fasting Lipid Panel: No results for input(s): "CHOL", "HDL", "LDLCALC", "TRIG", "CHOLHDL", "LDLDIRECT" in the last 72 hours.  Thyroid Function Tests: No results for input(s): "TSH", "T4TOTAL", "T3FREE", "THYROIDAB" in the last 72 hours.  Invalid input(s): "FREET3"  Anemia Panel: No results for input(s): "VITAMINB12", "FOLATE", "FERRITIN", "TIBC", "IRON", "RETICCTPCT" in the last 72 hours.   Radiology: US RENAL  Result Date: 09/11/2023 CLINICAL DATA:  Acute on chronic renal failure EXAM: RENAL / URINARY TRACT ULTRASOUND COMPLETE COMPARISON:  07/29/2023.  CT 09/10/2023 FINDINGS: Right Kidney: Renal measurements: 10.3 x 6.2 x 5.8 cm = volume: 192 mL. Echogenicity within normal limits. No mass or hydronephrosis visualized. Left Kidney: Renal measurements: 7.0 x 3.5 x 2.8 cm = volume: 37 mL. Atrophic with cortical thinning. No mass or hydronephrosis. Bladder: Decompressed with Foley catheter in place. Other: Moderate  ascites in the abdomen and pelvis. IMPRESSION: No acute findings.  Atrophic left kidney, stable. Moderate ascites. Electronically Signed   By: Charlett Nose M.D.   On: 09/11/2023 19:18   ECHOCARDIOGRAM COMPLETE  Result Date: 09/11/2023    ECHOCARDIOGRAM REPORT   Patient Name:   CHANCELER KOLB Date of Exam: 09/11/2023 Medical Rec #:  [id number]    Height:       70.0 in Accession #:    [id number]   Weight:       158.3 lb Date of Birth:  06/24/1941   BSA:          1.890 m Patient Age:    81 years     BP:           103/69 mmHg Patient Gender: M            HR:           62 bpm. Exam Location:  [facility] Procedure: 2D Echo, 3D Echo, Cardiac Doppler and Color Doppler Indications:     Syncope  History:         Patient has prior history of Echocardiogram examinations, most                  recent 10/30/2022. CHF and Cardiomyopathy, CAD and Previous                  Myocardial Infarction, Pacemaker and Defibrillator, Stroke,  [facility] CLINIC CARDIOLOGY Progress NOTE       Patient ID: Taylor [name] MRN: [id number] DOB/AGE: 1941-01-29 82 y.o.  Admit date: 09/10/2023 Referring Physician Dr.  Valera Castle Physician Dr. Zada Girt Primary Cardiologist Dr. Marcina Millard Reason for Consultation Chronic Heart Failure, EF < 20%  HPI: Taylor [name] is a 82 y.o. male  with a past medical history of paroxysmal atrial fibrillation (Eliquis), chronic HFrEF (EF < 20%), s/p CABG (08/2006), s/p dual-chamber ICD (2014), SSS, HTN, HLD, type 2 diabetes, pancreatitis, ischemic cardiomyopathy, ventricular fibrillation arrest (1994) s/p MI infarction, bilateral carotid artery stenosis s/p left carotid stent (06/2022), abdominal aortic aneurysm s/p endovascular repair (2008), CKD stage 4, COPD (baseline 2L ) liver cirrhosis with ascites (CT 08/2023), who presented to the ED on 09/10/2023 for frequent falls and generalized weakness. Cardiology was consulted for further evaluation.   Interval History: - Patient seen and examined at bedside, resting comfortably. Cardiology asked to reassess due to 8/10 chest pain this morning. Patient is sleeping comfortably and denies chest pain at the time of my visit. He has no complaints or concerns at this time. Denies chest pain, shortness of breath, palpitations, diaphoresis, syncope, edema, PND, orthopnea.   Review of systems complete and found to be negative unless listed above    Past Medical History:  Diagnosis Date   AAA (abdominal aortic aneurysm) (HCC) 06/03/2007   [facility]; Dr. Hart [place]   AICD (automatic cardioverter/defibrillator) present    Arrhythmia    atrial fibrillation   Barrett's esophagus    Bladder cancer (HCC)    Bradycardia    CAD (coronary artery disease)    CAP (community acquired pneumonia) 11/13/2019   CHF (congestive heart failure) (HCC)    Cluster headache    COVID-19 09/27/2021   DDD (degenerative disc disease), lumbar    Diabetes  mellitus without complication (HCC)    Dyspnea    WITH EXERTION   Edema    LEFT ANKLE   Fracture of skull base (HCC) 1997   due to fall   GERD (gastroesophageal reflux disease)    Gout    History of bladder cancer 12/1995   Hyperlipidemia    Hypertension    Hypocalcemia 04/19/2020   Possibly secondary to diuretics.   Low=5.9 04/19/2020   Malignant melanoma (HCC) 12/2012   right dorsal forearm excised   Myocardial infarction [facility])    LAST 2014   Osteoarthritis of knee    Other specified complication of vascular prosthetic devices, implants and grafts, initial encounter (HCC) 08/22/2021   Pacemaker 10/10/2006   Pancreatitis 11/22/2019   Pneumonia    2016   Pre-diabetes    Psoriasis    Rib fracture 1997   due to fall   Sleep apnea    CPAP   Stroke [facility])    Venous incompetence     Past Surgical History:  Procedure Laterality Date   ABDOMINAL AORTIC ANEURYSM REPAIR  06/03/2007   [facility]; Dr. Hart [place]   ANGIOPLASTY  1994   [place]   BLADDER TUMOR EXCISION  12/1995   CAROTID PTA/STENT INTERVENTION Left 07/23/2022   Procedure: CAROTID PTA/STENT INTERVENTION;  Surgeon: Renford Dills, MD;  Location: [facility] INVASIVE CV LAB;  Service: Cardiovascular;  Laterality: Left;   CATARACT EXTRACTION W/PHACO Left 10/22/2016   Procedure: CATARACT EXTRACTION PHACO AND INTRAOCULAR LENS PLACEMENT (IOC);  Surgeon: Galen Manila, MD;  Location: [facility] ORS;  Service: Ophthalmology;  Laterality: Left;  [place] 47.7 AP% 18.4 CDE 8.78 Fluid pack  strength and tone for age. Extremities: Warm and well perfused. No clubbing, cyanosis. No edema.  Neuro: Alert and oriented X 3. Psych: Answers  questions appropriately.   Labs: Basic Metabolic Panel: Recent Labs    09/12/23 0500 09/13/23 0251 09/14/23 0313  NA 131* 135 137  K 4.4 4.0 3.9  CL 90* 91* 96*  CO2 26 27 29   GLUCOSE 142* 127* 123*  BUN 104* 108* 97*  CREATININE 5.17* 5.17* 4.10*  CALCIUM 8.8* 8.7* 8.9  MG 2.3 2.2  --   PHOS 7.0* 5.8*  --    Liver Function Tests: No results for input(s): "AST", "ALT", "ALKPHOS", "BILITOT", "PROT", "ALBUMIN" in the last 72 hours.  No results for input(s): "LIPASE", "AMYLASE" in the last 72 hours. CBC: Recent Labs    09/12/23 0500  WBC 9.9  HGB 11.1*  HCT 35.0*  MCV 97.0  PLT 152   Cardiac Enzymes: No results for input(s): "CKTOTAL", "CKMB", "CKMBINDEX", "TROPONINIHS" in the last 72 hours.  BNP: No results for input(s): "BNP" in the last 72 hours.  D-Dimer: No results for input(s): "DDIMER" in the last 72 hours. Hemoglobin A1C: No results for input(s): "HGBA1C" in the last 72 hours.  Fasting Lipid Panel: No results for input(s): "CHOL", "HDL", "LDLCALC", "TRIG", "CHOLHDL", "LDLDIRECT" in the last 72 hours.  Thyroid Function Tests: No results for input(s): "TSH", "T4TOTAL", "T3FREE", "THYROIDAB" in the last 72 hours.  Invalid input(s): "FREET3"  Anemia Panel: No results for input(s): "VITAMINB12", "FOLATE", "FERRITIN", "TIBC", "IRON", "RETICCTPCT" in the last 72 hours.   Radiology: US RENAL  Result Date: 09/11/2023 CLINICAL DATA:  Acute on chronic renal failure EXAM: RENAL / URINARY TRACT ULTRASOUND COMPLETE COMPARISON:  07/29/2023.  CT 09/10/2023 FINDINGS: Right Kidney: Renal measurements: 10.3 x 6.2 x 5.8 cm = volume: 192 mL. Echogenicity within normal limits. No mass or hydronephrosis visualized. Left Kidney: Renal measurements: 7.0 x 3.5 x 2.8 cm = volume: 37 mL. Atrophic with cortical thinning. No mass or hydronephrosis. Bladder: Decompressed with Foley catheter in place. Other: Moderate  ascites in the abdomen and pelvis. IMPRESSION: No acute findings.  Atrophic left kidney, stable. Moderate ascites. Electronically Signed   By: Charlett Nose M.D.   On: 09/11/2023 19:18   ECHOCARDIOGRAM COMPLETE  Result Date: 09/11/2023    ECHOCARDIOGRAM REPORT   Patient Name:   CHANCELER KOLB Date of Exam: 09/11/2023 Medical Rec #:  [id number]    Height:       70.0 in Accession #:    [id number]   Weight:       158.3 lb Date of Birth:  06/24/1941   BSA:          1.890 m Patient Age:    81 years     BP:           103/69 mmHg Patient Gender: M            HR:           62 bpm. Exam Location:  [facility] Procedure: 2D Echo, 3D Echo, Cardiac Doppler and Color Doppler Indications:     Syncope  History:         Patient has prior history of Echocardiogram examinations, most                  recent 10/30/2022. CHF and Cardiomyopathy, CAD and Previous                  Myocardial Infarction, Pacemaker and Defibrillator, Stroke,  strength and tone for age. Extremities: Warm and well perfused. No clubbing, cyanosis. No edema.  Neuro: Alert and oriented X 3. Psych: Answers  questions appropriately.   Labs: Basic Metabolic Panel: Recent Labs    09/12/23 0500 09/13/23 0251 09/14/23 0313  NA 131* 135 137  K 4.4 4.0 3.9  CL 90* 91* 96*  CO2 26 27 29   GLUCOSE 142* 127* 123*  BUN 104* 108* 97*  CREATININE 5.17* 5.17* 4.10*  CALCIUM 8.8* 8.7* 8.9  MG 2.3 2.2  --   PHOS 7.0* 5.8*  --    Liver Function Tests: No results for input(s): "AST", "ALT", "ALKPHOS", "BILITOT", "PROT", "ALBUMIN" in the last 72 hours.  No results for input(s): "LIPASE", "AMYLASE" in the last 72 hours. CBC: Recent Labs    09/12/23 0500  WBC 9.9  HGB 11.1*  HCT 35.0*  MCV 97.0  PLT 152   Cardiac Enzymes: No results for input(s): "CKTOTAL", "CKMB", "CKMBINDEX", "TROPONINIHS" in the last 72 hours.  BNP: No results for input(s): "BNP" in the last 72 hours.  D-Dimer: No results for input(s): "DDIMER" in the last 72 hours. Hemoglobin A1C: No results for input(s): "HGBA1C" in the last 72 hours.  Fasting Lipid Panel: No results for input(s): "CHOL", "HDL", "LDLCALC", "TRIG", "CHOLHDL", "LDLDIRECT" in the last 72 hours.  Thyroid Function Tests: No results for input(s): "TSH", "T4TOTAL", "T3FREE", "THYROIDAB" in the last 72 hours.  Invalid input(s): "FREET3"  Anemia Panel: No results for input(s): "VITAMINB12", "FOLATE", "FERRITIN", "TIBC", "IRON", "RETICCTPCT" in the last 72 hours.   Radiology: US RENAL  Result Date: 09/11/2023 CLINICAL DATA:  Acute on chronic renal failure EXAM: RENAL / URINARY TRACT ULTRASOUND COMPLETE COMPARISON:  07/29/2023.  CT 09/10/2023 FINDINGS: Right Kidney: Renal measurements: 10.3 x 6.2 x 5.8 cm = volume: 192 mL. Echogenicity within normal limits. No mass or hydronephrosis visualized. Left Kidney: Renal measurements: 7.0 x 3.5 x 2.8 cm = volume: 37 mL. Atrophic with cortical thinning. No mass or hydronephrosis. Bladder: Decompressed with Foley catheter in place. Other: Moderate  ascites in the abdomen and pelvis. IMPRESSION: No acute findings.  Atrophic left kidney, stable. Moderate ascites. Electronically Signed   By: Charlett Nose M.D.   On: 09/11/2023 19:18   ECHOCARDIOGRAM COMPLETE  Result Date: 09/11/2023    ECHOCARDIOGRAM REPORT   Patient Name:   CHANCELER KOLB Date of Exam: 09/11/2023 Medical Rec #:  [id number]    Height:       70.0 in Accession #:    [id number]   Weight:       158.3 lb Date of Birth:  06/24/1941   BSA:          1.890 m Patient Age:    81 years     BP:           103/69 mmHg Patient Gender: M            HR:           62 bpm. Exam Location:  [facility] Procedure: 2D Echo, 3D Echo, Cardiac Doppler and Color Doppler Indications:     Syncope  History:         Patient has prior history of Echocardiogram examinations, most                  recent 10/30/2022. CHF and Cardiomyopathy, CAD and Previous                  Myocardial Infarction, Pacemaker and Defibrillator, Stroke,  strength and tone for age. Extremities: Warm and well perfused. No clubbing, cyanosis. No edema.  Neuro: Alert and oriented X 3. Psych: Answers  questions appropriately.   Labs: Basic Metabolic Panel: Recent Labs    09/12/23 0500 09/13/23 0251 09/14/23 0313  NA 131* 135 137  K 4.4 4.0 3.9  CL 90* 91* 96*  CO2 26 27 29   GLUCOSE 142* 127* 123*  BUN 104* 108* 97*  CREATININE 5.17* 5.17* 4.10*  CALCIUM 8.8* 8.7* 8.9  MG 2.3 2.2  --   PHOS 7.0* 5.8*  --    Liver Function Tests: No results for input(s): "AST", "ALT", "ALKPHOS", "BILITOT", "PROT", "ALBUMIN" in the last 72 hours.  No results for input(s): "LIPASE", "AMYLASE" in the last 72 hours. CBC: Recent Labs    09/12/23 0500  WBC 9.9  HGB 11.1*  HCT 35.0*  MCV 97.0  PLT 152   Cardiac Enzymes: No results for input(s): "CKTOTAL", "CKMB", "CKMBINDEX", "TROPONINIHS" in the last 72 hours.  BNP: No results for input(s): "BNP" in the last 72 hours.  D-Dimer: No results for input(s): "DDIMER" in the last 72 hours. Hemoglobin A1C: No results for input(s): "HGBA1C" in the last 72 hours.  Fasting Lipid Panel: No results for input(s): "CHOL", "HDL", "LDLCALC", "TRIG", "CHOLHDL", "LDLDIRECT" in the last 72 hours.  Thyroid Function Tests: No results for input(s): "TSH", "T4TOTAL", "T3FREE", "THYROIDAB" in the last 72 hours.  Invalid input(s): "FREET3"  Anemia Panel: No results for input(s): "VITAMINB12", "FOLATE", "FERRITIN", "TIBC", "IRON", "RETICCTPCT" in the last 72 hours.   Radiology: US RENAL  Result Date: 09/11/2023 CLINICAL DATA:  Acute on chronic renal failure EXAM: RENAL / URINARY TRACT ULTRASOUND COMPLETE COMPARISON:  07/29/2023.  CT 09/10/2023 FINDINGS: Right Kidney: Renal measurements: 10.3 x 6.2 x 5.8 cm = volume: 192 mL. Echogenicity within normal limits. No mass or hydronephrosis visualized. Left Kidney: Renal measurements: 7.0 x 3.5 x 2.8 cm = volume: 37 mL. Atrophic with cortical thinning. No mass or hydronephrosis. Bladder: Decompressed with Foley catheter in place. Other: Moderate  ascites in the abdomen and pelvis. IMPRESSION: No acute findings.  Atrophic left kidney, stable. Moderate ascites. Electronically Signed   By: Charlett Nose M.D.   On: 09/11/2023 19:18   ECHOCARDIOGRAM COMPLETE  Result Date: 09/11/2023    ECHOCARDIOGRAM REPORT   Patient Name:   CHANCELER KOLB Date of Exam: 09/11/2023 Medical Rec #:  [id number]    Height:       70.0 in Accession #:    [id number]   Weight:       158.3 lb Date of Birth:  06/24/1941   BSA:          1.890 m Patient Age:    81 years     BP:           103/69 mmHg Patient Gender: M            HR:           62 bpm. Exam Location:  [facility] Procedure: 2D Echo, 3D Echo, Cardiac Doppler and Color Doppler Indications:     Syncope  History:         Patient has prior history of Echocardiogram examinations, most                  recent 10/30/2022. CHF and Cardiomyopathy, CAD and Previous                  Myocardial Infarction, Pacemaker and Defibrillator, Stroke,  strength and tone for age. Extremities: Warm and well perfused. No clubbing, cyanosis. No edema.  Neuro: Alert and oriented X 3. Psych: Answers  questions appropriately.   Labs: Basic Metabolic Panel: Recent Labs    09/12/23 0500 09/13/23 0251 09/14/23 0313  NA 131* 135 137  K 4.4 4.0 3.9  CL 90* 91* 96*  CO2 26 27 29   GLUCOSE 142* 127* 123*  BUN 104* 108* 97*  CREATININE 5.17* 5.17* 4.10*  CALCIUM 8.8* 8.7* 8.9  MG 2.3 2.2  --   PHOS 7.0* 5.8*  --    Liver Function Tests: No results for input(s): "AST", "ALT", "ALKPHOS", "BILITOT", "PROT", "ALBUMIN" in the last 72 hours.  No results for input(s): "LIPASE", "AMYLASE" in the last 72 hours. CBC: Recent Labs    09/12/23 0500  WBC 9.9  HGB 11.1*  HCT 35.0*  MCV 97.0  PLT 152   Cardiac Enzymes: No results for input(s): "CKTOTAL", "CKMB", "CKMBINDEX", "TROPONINIHS" in the last 72 hours.  BNP: No results for input(s): "BNP" in the last 72 hours.  D-Dimer: No results for input(s): "DDIMER" in the last 72 hours. Hemoglobin A1C: No results for input(s): "HGBA1C" in the last 72 hours.  Fasting Lipid Panel: No results for input(s): "CHOL", "HDL", "LDLCALC", "TRIG", "CHOLHDL", "LDLDIRECT" in the last 72 hours.  Thyroid Function Tests: No results for input(s): "TSH", "T4TOTAL", "T3FREE", "THYROIDAB" in the last 72 hours.  Invalid input(s): "FREET3"  Anemia Panel: No results for input(s): "VITAMINB12", "FOLATE", "FERRITIN", "TIBC", "IRON", "RETICCTPCT" in the last 72 hours.   Radiology: US RENAL  Result Date: 09/11/2023 CLINICAL DATA:  Acute on chronic renal failure EXAM: RENAL / URINARY TRACT ULTRASOUND COMPLETE COMPARISON:  07/29/2023.  CT 09/10/2023 FINDINGS: Right Kidney: Renal measurements: 10.3 x 6.2 x 5.8 cm = volume: 192 mL. Echogenicity within normal limits. No mass or hydronephrosis visualized. Left Kidney: Renal measurements: 7.0 x 3.5 x 2.8 cm = volume: 37 mL. Atrophic with cortical thinning. No mass or hydronephrosis. Bladder: Decompressed with Foley catheter in place. Other: Moderate  ascites in the abdomen and pelvis. IMPRESSION: No acute findings.  Atrophic left kidney, stable. Moderate ascites. Electronically Signed   By: Charlett Nose M.D.   On: 09/11/2023 19:18   ECHOCARDIOGRAM COMPLETE  Result Date: 09/11/2023    ECHOCARDIOGRAM REPORT   Patient Name:   CHANCELER KOLB Date of Exam: 09/11/2023 Medical Rec #:  [id number]    Height:       70.0 in Accession #:    [id number]   Weight:       158.3 lb Date of Birth:  06/24/1941   BSA:          1.890 m Patient Age:    81 years     BP:           103/69 mmHg Patient Gender: M            HR:           62 bpm. Exam Location:  [facility] Procedure: 2D Echo, 3D Echo, Cardiac Doppler and Color Doppler Indications:     Syncope  History:         Patient has prior history of Echocardiogram examinations, most                  recent 10/30/2022. CHF and Cardiomyopathy, CAD and Previous                  Myocardial Infarction, Pacemaker and Defibrillator, Stroke,  [facility] CLINIC CARDIOLOGY Progress NOTE       Patient ID: Taylor [name] MRN: [id number] DOB/AGE: 1941-01-29 82 y.o.  Admit date: 09/10/2023 Referring Physician Dr.  Valera Castle Physician Dr. Zada Girt Primary Cardiologist Dr. Marcina Millard Reason for Consultation Chronic Heart Failure, EF < 20%  HPI: Taylor [name] is a 82 y.o. male  with a past medical history of paroxysmal atrial fibrillation (Eliquis), chronic HFrEF (EF < 20%), s/p CABG (08/2006), s/p dual-chamber ICD (2014), SSS, HTN, HLD, type 2 diabetes, pancreatitis, ischemic cardiomyopathy, ventricular fibrillation arrest (1994) s/p MI infarction, bilateral carotid artery stenosis s/p left carotid stent (06/2022), abdominal aortic aneurysm s/p endovascular repair (2008), CKD stage 4, COPD (baseline 2L ) liver cirrhosis with ascites (CT 08/2023), who presented to the ED on 09/10/2023 for frequent falls and generalized weakness. Cardiology was consulted for further evaluation.   Interval History: - Patient seen and examined at bedside, resting comfortably. Cardiology asked to reassess due to 8/10 chest pain this morning. Patient is sleeping comfortably and denies chest pain at the time of my visit. He has no complaints or concerns at this time. Denies chest pain, shortness of breath, palpitations, diaphoresis, syncope, edema, PND, orthopnea.   Review of systems complete and found to be negative unless listed above    Past Medical History:  Diagnosis Date   AAA (abdominal aortic aneurysm) (HCC) 06/03/2007   [facility]; Dr. Hart [place]   AICD (automatic cardioverter/defibrillator) present    Arrhythmia    atrial fibrillation   Barrett's esophagus    Bladder cancer (HCC)    Bradycardia    CAD (coronary artery disease)    CAP (community acquired pneumonia) 11/13/2019   CHF (congestive heart failure) (HCC)    Cluster headache    COVID-19 09/27/2021   DDD (degenerative disc disease), lumbar    Diabetes  mellitus without complication (HCC)    Dyspnea    WITH EXERTION   Edema    LEFT ANKLE   Fracture of skull base (HCC) 1997   due to fall   GERD (gastroesophageal reflux disease)    Gout    History of bladder cancer 12/1995   Hyperlipidemia    Hypertension    Hypocalcemia 04/19/2020   Possibly secondary to diuretics.   Low=5.9 04/19/2020   Malignant melanoma (HCC) 12/2012   right dorsal forearm excised   Myocardial infarction [facility])    LAST 2014   Osteoarthritis of knee    Other specified complication of vascular prosthetic devices, implants and grafts, initial encounter (HCC) 08/22/2021   Pacemaker 10/10/2006   Pancreatitis 11/22/2019   Pneumonia    2016   Pre-diabetes    Psoriasis    Rib fracture 1997   due to fall   Sleep apnea    CPAP   Stroke [facility])    Venous incompetence     Past Surgical History:  Procedure Laterality Date   ABDOMINAL AORTIC ANEURYSM REPAIR  06/03/2007   [facility]; Dr. Hart [place]   ANGIOPLASTY  1994   MI   BLADDER TUMOR EXCISION  12/1995   CAROTID PTA/STENT INTERVENTION Left 07/23/2022   Procedure: CAROTID PTA/STENT INTERVENTION;  Surgeon: Renford Dills, MD;  Location: [facility] INVASIVE CV LAB;  Service: Cardiovascular;  Laterality: Left;   CATARACT EXTRACTION W/PHACO Left 10/22/2016   Procedure: CATARACT EXTRACTION PHACO AND INTRAOCULAR LENS PLACEMENT (IOC);  Surgeon: Galen Manila, MD;  Location: [facility] ORS;  Service: Ophthalmology;  Laterality: Left;  [place] 47.7 AP% 18.4 CDE 8.78 Fluid pack  [facility] CLINIC CARDIOLOGY Progress NOTE       Patient ID: Taylor [name] MRN: [id number] DOB/AGE: 1941-01-29 82 y.o.  Admit date: 09/10/2023 Referring Physician Dr.  Valera Castle Physician Dr. Zada Girt Primary Cardiologist Dr. Marcina Millard Reason for Consultation Chronic Heart Failure, EF < 20%  HPI: Taylor [name] is a 82 y.o. male  with a past medical history of paroxysmal atrial fibrillation (Eliquis), chronic HFrEF (EF < 20%), s/p CABG (08/2006), s/p dual-chamber ICD (2014), SSS, HTN, HLD, type 2 diabetes, pancreatitis, ischemic cardiomyopathy, ventricular fibrillation arrest (1994) s/p MI infarction, bilateral carotid artery stenosis s/p left carotid stent (06/2022), abdominal aortic aneurysm s/p endovascular repair (2008), CKD stage 4, COPD (baseline 2L ) liver cirrhosis with ascites (CT 08/2023), who presented to the ED on 09/10/2023 for frequent falls and generalized weakness. Cardiology was consulted for further evaluation.   Interval History: - Patient seen and examined at bedside, resting comfortably. Cardiology asked to reassess due to 8/10 chest pain this morning. Patient is sleeping comfortably and denies chest pain at the time of my visit. He has no complaints or concerns at this time. Denies chest pain, shortness of breath, palpitations, diaphoresis, syncope, edema, PND, orthopnea.   Review of systems complete and found to be negative unless listed above    Past Medical History:  Diagnosis Date   AAA (abdominal aortic aneurysm) (HCC) 06/03/2007   [facility]; Dr. Hart [place]   AICD (automatic cardioverter/defibrillator) present    Arrhythmia    atrial fibrillation   Barrett's esophagus    Bladder cancer (HCC)    Bradycardia    CAD (coronary artery disease)    CAP (community acquired pneumonia) 11/13/2019   CHF (congestive heart failure) (HCC)    Cluster headache    COVID-19 09/27/2021   DDD (degenerative disc disease), lumbar    Diabetes  mellitus without complication (HCC)    Dyspnea    WITH EXERTION   Edema    LEFT ANKLE   Fracture of skull base (HCC) 1997   due to fall   GERD (gastroesophageal reflux disease)    Gout    History of bladder cancer 12/1995   Hyperlipidemia    Hypertension    Hypocalcemia 04/19/2020   Possibly secondary to diuretics.   Low=5.9 04/19/2020   Malignant melanoma (HCC) 12/2012   right dorsal forearm excised   Myocardial infarction [facility])    LAST 2014   Osteoarthritis of knee    Other specified complication of vascular prosthetic devices, implants and grafts, initial encounter (HCC) 08/22/2021   Pacemaker 10/10/2006   Pancreatitis 11/22/2019   Pneumonia    2016   Pre-diabetes    Psoriasis    Rib fracture 1997   due to fall   Sleep apnea    CPAP   Stroke [facility])    Venous incompetence     Past Surgical History:  Procedure Laterality Date   ABDOMINAL AORTIC ANEURYSM REPAIR  06/03/2007   [facility]; Dr. Hart [place]   ANGIOPLASTY  1994   [place]   BLADDER TUMOR EXCISION  12/1995   CAROTID PTA/STENT INTERVENTION Left 07/23/2022   Procedure: CAROTID PTA/STENT INTERVENTION;  Surgeon: Renford Dills, MD;  Location: [facility] INVASIVE CV LAB;  Service: Cardiovascular;  Laterality: Left;   CATARACT EXTRACTION W/PHACO Left 10/22/2016   Procedure: CATARACT EXTRACTION PHACO AND INTRAOCULAR LENS PLACEMENT (IOC);  Surgeon: Galen Manila, MD;  Location: [facility] ORS;  Service: Ophthalmology;  Laterality: Left;  [place] 47.7 AP% 18.4 CDE 8.78 Fluid pack

## 2023-09-14 NOTE — Progress Notes (Signed)
[facility]  PROGRESS NOTE   Subjective:   Awake and alert.  Still has cough and shortness of breath. Renal indices have improved.  Complains of chest pain.  Objective:  Vital signs: Blood pressure 134/89, pulse (!) 59, temperature 98.5 F (36.9 C), temperature source Oral, resp. rate (!) 26, height 5\' 10"  (1.778 m), weight 75.8 kg, SpO2 100%.  Intake/Output Summary (Last 24 hours) at 09/14/2023 1304 Last data filed at 09/14/2023 0445 Gross per 24 hour  Intake 240 ml  Output 850 ml  Net -610 ml   Filed Weights   09/10/23 2300 09/11/23 0500 09/12/23 0500  Weight: 71.8 kg 71.8 kg 75.8 kg     Physical Exam: General:  No acute distress  Head:  Normocephalic, atraumatic. Moist oral mucosal membranes  Eyes:  Anicteric  Neck:  Supple  Lungs:   Clear to auscultation, normal effort  Heart:  S1S2 no rubs  Abdomen:   Soft, nontender, bowel sounds present  Extremities:  peripheral edema.  Neurologic:  Awake, alert, following commands  Skin:  No lesions  Access:     Basic Metabolic Panel: Recent Labs  Lab 09/10/23 1606 09/10/23 1801 09/11/23 0330 09/11/23 1035 09/11/23 1754 09/12/23 0500 09/13/23 0251 09/14/23 0313  NA 135  --  132* 134* 132* 131* 135 137  K 6.8*   < > 6.0* 5.5* 5.0 4.4 4.0 3.9  CL 95*  --  95* 94* 90* 90* 91* 96*  CO2 25  --  24 27 26 26 27 29   GLUCOSE 173*  --  90 124* 174* 142* 127* 123*  BUN 93*  --  99* 95* 96* 104* 108* 97*  CREATININE 5.46*  --  4.95* 5.17* 5.27* 5.17* 5.17* 4.10*  CALCIUM 8.9  --  8.7* 9.3 9.1 8.8* 8.7* 8.9  MG 2.8*  --  2.3  --   --  2.3 2.2  --   PHOS 6.5*  --  5.9*  --   --  7.0* 5.8*  --    < > = values in this interval not displayed.   GFR: Estimated Creatinine Clearance: 14.8 mL/min (A) (by C-G formula based on SCr of 4.1 mg/dL (H)).  Liver Function Tests: Recent Labs  Lab 09/10/23 1606 09/11/23 0330  AST 28 23  ALT 10 9  ALKPHOS 141* 119  BILITOT 3.1* 2.6*  PROT 7.9 7.1  ALBUMIN 2.8* 2.8*    No results for input(s): "LIPASE", "AMYLASE" in the last 168 hours. Recent Labs  Lab 09/10/23 2138  AMMONIA 42*    CBC: Recent Labs  Lab 09/10/23 1606 09/11/23 0330 09/12/23 0500  WBC 13.9* 9.7 9.9  NEUTROABS 12.0*  --   --   HGB 12.3* 11.0* 11.1*  HCT 40.2 35.3* 35.0*  MCV 101.0* 98.9 97.0  PLT 133* 129* 152     HbA1C: Hemoglobin A1C  Date/Time Value Ref Range Status  08/15/2023 02:04 PM 6.6 (A) 4.0 - 5.6 % Final   Hgb A1c MFr Bld  Date/Time Value Ref Range Status  09/11/2023 12:06 AM 6.6 (H) 4.8 - 5.6 % Final    Comment:    (NOTE) Pre diabetes:          5.7%-6.4%  Diabetes:              >6.4%  Glycemic control for   <7.0% adults with diabetes   05/05/2023 02:32 PM 7.1 (H) 4.8 - 5.6 % Final    Comment:  Prediabetes: 5.7 - 6.4          Diabetes: >6.4          Glycemic control for adults with diabetes: <7.0     Urinalysis: No results for input(s): "COLORURINE", "LABSPEC", "PHURINE", "GLUCOSEU", "HGBUR", "BILIRUBINUR", "KETONESUR", "PROTEINUR", "UROBILINOGEN", "NITRITE", "LEUKOCYTESUR" in the last 72 hours.  Invalid input(s): "APPERANCEUR"    Imaging: No results found.   Medications:    heparin 1,000 Units/hr (09/14/23 1100)   nitroGLYCERIN 15 mcg/min (09/14/23 1235)    aspirin EC  81 mg Oral Daily   Chlorhexidine Gluconate Cloth  6 each Topical QHS   feeding supplement (NEPRO CARB STEADY)  237 mL Oral TID BM   insulin aspart  0-5 Units Subcutaneous QHS   insulin aspart  0-9 Units Subcutaneous TID WC   lactulose  10 g Oral BID   midodrine  5 mg Oral TID WC   multivitamin with minerals  1 tablet Oral Daily   mupirocin ointment  1 Application Nasal BID   pantoprazole  40 mg Oral Daily   sodium chloride flush  10 mL Intravenous Q12H   triamcinolone ointment   Topical BID    Assessment/ Plan:     82 year old white male with a past medical history of hypertension, coronary artery disease, congestive heart failure with ejection  fraction of less than 20%, diabetes, peripheral vascular disease, CABG, status post AICD, cirrhosis of the liver with ascites on chronic kidney disease stage IV now admitted with history of pneumonia and hyperkalemia. Patient also has acute kidney injury on chronic kidney disease.   #1: Acute kidney injury on chronic kidney disease: His baseline creatinine has been 2.65 which has now gone up to 5 but improved to 4.1 today.  eGFR is 14 cc/min.  The acute kidney injury is most likely secondary to hemodynamic compromise.  Urine output has improved since.  He is not a good candidate for long-term hemodialysis treatments.   #2: Hyperkalemia: Potassium has now improved.   #3: Community-acquired pneumonia: He is now off of IV antibiotics.  Patient received vancomycin and cefepime.   #4: Cirrhosis/ascites: Patient has nonalcoholic liver cirrhosis with ascites.  Continue lactulose and midodrine as ordered.  Continue to monitor ammonia levels.   #5: Hypertension/congestive heart failure: Presently off of IV fluids.  Will need to resume low-dose diuretics on discharge.  Cardiology note appreciated.   Spoke to the patient and his wife at bedside in detail. He is not a good candidate for long-term renal replacement therapy.  Labs and medications reviewed. Will continue to follow along with you.   LOS: 4 Lorain Childes, MD [facility] 10/20/20241:04 PM

## 2023-09-14 NOTE — Consult Note (Signed)
PHARMACY - ANTICOAGULATION CONSULT NOTE  Pharmacy Consult for Heparin  Indication: chest pain/ACS  Allergies  Allergen Reactions   Amlodipine Besylate Swelling    Had a reaction when taking with colcrys    Crestor [Rosuvastatin]     Muscle cramps and pain. Tolerates atorvastatin    Patient Measurements: Height: 5\' 10"  (177.8 cm) Weight: 75.8 kg (167 lb 1.7 oz) IBW/kg (Calculated) : 73 Heparin Dosing Weight: 71.8 kg  Vital Signs: Temp: 98.5 F (36.9 C) (10/20 0445) Temp Source: Oral (10/20 0445) BP: 122/71 (10/20 0946) Pulse Rate: 59 (10/20 0445)  Labs: Recent Labs    09/12/23 0500 09/13/23 0251 09/14/23 0313  HGB 11.1*  --   --   HCT 35.0*  --   --   PLT 152  --   --   LABPROT 25.2* 20.2* 18.2*  INR 2.3* 1.7* 1.5*  CREATININE 5.17* 5.17* 4.10*    Estimated Creatinine Clearance: 14.8 mL/min (A) (by C-G formula based on SCr of 4.1 mg/dL (H)).   Medical History: Past Medical History:  Diagnosis Date   AAA (abdominal aortic aneurysm) (HCC) 06/03/2007   [facility]; Dr. Hart [place]   AICD (automatic cardioverter/defibrillator) present    Arrhythmia    atrial fibrillation   Barrett's esophagus    Bladder cancer (HCC)    Bradycardia    CAD (coronary artery disease)    CAP (community acquired pneumonia) 11/13/2019   CHF (congestive heart failure) (HCC)    Cluster headache    COVID-19 09/27/2021   DDD (degenerative disc disease), lumbar    Diabetes mellitus without complication (HCC)    Dyspnea    WITH EXERTION   Edema    LEFT ANKLE   Fracture of skull base (HCC) 1997   due to fall   GERD (gastroesophageal reflux disease)    Gout    History of bladder cancer 12/1995   Hyperlipidemia    Hypertension    Hypocalcemia 04/19/2020   Possibly secondary to diuretics.   Low=5.9 04/19/2020   Malignant melanoma (HCC) 12/2012   right dorsal forearm excised   Myocardial infarction [facility])    LAST 2014   Osteoarthritis of knee    Other specified  complication of vascular prosthetic devices, implants and grafts, initial encounter (HCC) 08/22/2021   Pacemaker 10/10/2006   Pancreatitis 11/22/2019   Pneumonia    2016   Pre-diabetes    Psoriasis    Rib fracture 1997   due to fall   Sleep apnea    CPAP   Stroke (HCC)    Venous incompetence     Medications:  Medications Prior to Admission  Medication Sig Dispense Refill Last Dose   acetaminophen (TYLENOL) 500 MG tablet Take 500 mg by mouth every 6 (six) hours as needed for mild pain.   prn   albuterol (VENTOLIN HFA) 108 (90 Base) MCG/ACT inhaler Inhale 2 puffs into the lungs every 6 (six) hours as needed for wheezing or shortness of breath. 8 g 2 prn   apixaban (ELIQUIS) 2.5 MG TABS tablet Take 1 tablet (2.5 mg total) by mouth 2 (two) times daily. 60 tablet 1 09/10/2023 at 0700   aspirin 81 MG EC tablet Take 81 mg by mouth daily.    09/10/2023 at 0700   atorvastatin (LIPITOR) 40 MG tablet TAKE 1 TABLET(40 MG) BY MOUTH DAILY 90 tablet 1 09/10/2023 at 0700   carvedilol (COREG) 12.5 MG tablet Take 12.5 mg by mouth 2 (two) times daily with a meal.   09/10/2023  at 0700   isosorbide mononitrate (IMDUR) 30 MG 24 hr tablet TAKE 1/2 TABLET(15 MG) BY MOUTH DAILY 45 tablet 3 09/10/2023 at 0700   JARDIANCE 10 MG TABS tablet Take 10 mg by mouth daily.   09/10/2023 at 0700   Magnesium 200 MG TABS Take 1 tablet (200 mg total) by mouth in the morning and at bedtime. 60 tablet 5 09/10/2023 at 0700   pantoprazole (PROTONIX) 40 MG tablet TAKE 1 TABLET BY MOUTH EVERY DAY 90 tablet 4 09/10/2023 at 0700   potassium chloride SA (KLOR-CON M) 20 MEQ tablet Take 2 tablets (40 mEq total) by mouth daily. (Patient taking differently: Take 20 mEq by mouth daily.) 180 tablet 1 09/10/2023 at 0700   torsemide (DEMADEX) 20 MG tablet Take 3 tablets (60 mg total) by mouth 2 (two) times daily. 180 tablet 5 09/10/2023 at 0700   traMADol (ULTRAM) 50 MG tablet Take 1 tablet (50 mg total) by mouth every 6 (six) hours as  needed. 20 tablet 0 prn   triamcinolone ointment (KENALOG) 0.1 % APPLY TOPICALLY TWICE DAILY AS DIRECTED 454 g 3 prn   erythromycin ophthalmic ointment Place 1 Application into the left eye 3 (three) times daily. (Patient not taking: Reported on 09/10/2023)   Not Taking   ipratropium-albuterol (DUONEB) 0.5-2.5 (3) MG/3ML SOLN Inhale 3 mLs into the lungs every 4 (four) hours as needed (wheezing, shob). 360 mL 1    spironolactone (ALDACTONE) 25 MG tablet Take 1 tablet (25 mg total) by mouth daily. (Patient not taking: Reported on 09/10/2023) 30 tablet 1 Not Taking   Scheduled:   aspirin EC  81 mg Oral Daily   Chlorhexidine Gluconate Cloth  6 each Topical QHS   feeding supplement (NEPRO CARB STEADY)  237 mL Oral TID BM   heparin injection (subcutaneous)  5,000 Units Subcutaneous Q8H   insulin aspart  0-5 Units Subcutaneous QHS   insulin aspart  0-9 Units Subcutaneous TID WC   lactulose  10 g Oral BID   midodrine  5 mg Oral TID WC   multivitamin with minerals  1 tablet Oral Daily   mupirocin ointment  1 Application Nasal BID   pantoprazole  40 mg Oral Daily   sodium chloride flush  10 mL Intravenous Q12H   triamcinolone ointment   Topical BID   Infusions:   nitroGLYCERIN     PRN: acetaminophen, dextromethorphan-guaiFENesin, ipratropium-albuterol, ondansetron (ZOFRAN) IV Anti-infectives (From admission, onward)    Start     Dose/Rate Route Frequency Ordered Stop   09/12/23 1800  ceFEPIme (MAXIPIME) 1 g in sodium chloride 0.9 % 100 mL IVPB  Status:  Discontinued        1 g 200 mL/hr over 30 Minutes Intravenous Every 24 hours 09/10/23 2041 09/11/23 1042   09/11/23 1800  ceFEPIme (MAXIPIME) 1 g in sodium chloride 0.9 % 100 mL IVPB  Status:  Discontinued        1 g 200 mL/hr over 30 Minutes Intravenous Every 24 hours 09/10/23 2039 09/10/23 2041   09/10/23 2045  vancomycin (VANCOREADY) IVPB 1750 mg/350 mL        1,750 mg 175 mL/hr over 120 Minutes Intravenous  Once 09/10/23 2035 09/10/23  2356   09/10/23 2035  vancomycin variable dose per unstable renal function (pharmacist dosing)  Status:  Discontinued         Does not apply See admin instructions 09/10/23 2035 09/11/23 1042   09/10/23 1630  levofloxacin (LEVAQUIN) IVPB 750 mg  750 mg 100 mL/hr over 90 Minutes Intravenous  Once 09/10/23 1628 09/10/23 1844       Assessment: 82 y.o. male past medical history significant for HFrEF (< 20%), CKD stage IV, hypertension, hyperlipidemia, diabetes, COPD on chronic home 2 L, CAD s/p CABG, history of atrial fibrillation on Eliquis, who presents to ED from home via EMS for fall x3. CBC stable. Trop 15. Pharmacy consulted to start heparin infusion for ACS. Pt has been off apixaban for at least 5 days, but I do not predict false elevation of the heparin level, but will order both aPTT and heparin level for the first draw. INR was elevated but now trending down.   Goal of Therapy:  Heparin level 0.3-0.7 units/ml Monitor platelets by anticoagulation protocol: Yes   Plan:  Give 4000 units bolus x 1 Start heparin infusion at 1000 units/hr Check anti-Xa level and aPTT in 8 hours and daily while on heparin Continue to monitor H&H and platelets  Ronnald Ramp, PharmD, BCPS 09/14/2023,10:32 AM

## 2023-09-14 NOTE — Consult Note (Signed)
PHARMACY - ANTICOAGULATION CONSULT NOTE  Pharmacy Consult for Heparin  Indication: chest pain/ACS  Allergies  Allergen Reactions   Amlodipine Besylate Swelling    Had a reaction when taking with colcrys    Crestor [Rosuvastatin]     Muscle cramps and pain. Tolerates atorvastatin    Patient Measurements: Height: 5\' 10"  (177.8 cm) Weight: 75.8 kg (167 lb 1.7 oz) IBW/kg (Calculated) : 73 Heparin Dosing Weight: 71.8 kg  Vital Signs: BP: 134/68 (10/20 1722)  Labs: Recent Labs    09/12/23 0500 09/13/23 0251 09/14/23 0313  HGB 11.1*  --   --   HCT 35.0*  --   --   PLT 152  --   --   LABPROT 25.2* 20.2* 18.2*  INR 2.3* 1.7* 1.5*  CREATININE 5.17* 5.17* 4.10*    Estimated Creatinine Clearance: 14.8 mL/min (A) (by C-G formula based on SCr of 4.1 mg/dL (H)).   Medical History: Past Medical History:  Diagnosis Date   AAA (abdominal aortic aneurysm) (HCC) 06/03/2007   [facility]; Dr. Hart [place]   AICD (automatic cardioverter/defibrillator) present    Arrhythmia    atrial fibrillation   Barrett's esophagus    Bladder cancer (HCC)    Bradycardia    CAD (coronary artery disease)    CAP (community acquired pneumonia) 11/13/2019   CHF (congestive heart failure) (HCC)    Cluster headache    COVID-19 09/27/2021   DDD (degenerative disc disease), lumbar    Diabetes mellitus without complication (HCC)    Dyspnea    WITH EXERTION   Edema    LEFT ANKLE   Fracture of skull base (HCC) 1997   due to fall   GERD (gastroesophageal reflux disease)    Gout    History of bladder cancer 12/1995   Hyperlipidemia    Hypertension    Hypocalcemia 04/19/2020   Possibly secondary to diuretics.   Low=5.9 04/19/2020   Malignant melanoma (HCC) 12/2012   right dorsal forearm excised   Myocardial infarction [facility])    LAST 2014   Osteoarthritis of knee    Other specified complication of vascular prosthetic devices, implants and grafts, initial encounter (HCC) 08/22/2021    Pacemaker 10/10/2006   Pancreatitis 11/22/2019   Pneumonia    2016   Pre-diabetes    Psoriasis    Rib fracture 1997   due to fall   Sleep apnea    CPAP   Stroke (HCC)    Venous incompetence     Medications:  Medications Prior to Admission  Medication Sig Dispense Refill Last Dose   acetaminophen (TYLENOL) 500 MG tablet Take 500 mg by mouth every 6 (six) hours as needed for mild pain.   prn   albuterol (VENTOLIN HFA) 108 (90 Base) MCG/ACT inhaler Inhale 2 puffs into the lungs every 6 (six) hours as needed for wheezing or shortness of breath. 8 g 2 prn   apixaban (ELIQUIS) 2.5 MG TABS tablet Take 1 tablet (2.5 mg total) by mouth 2 (two) times daily. 60 tablet 1 09/10/2023 at 0700   aspirin 81 MG EC tablet Take 81 mg by mouth daily.    09/10/2023 at 0700   atorvastatin (LIPITOR) 40 MG tablet TAKE 1 TABLET(40 MG) BY MOUTH DAILY 90 tablet 1 09/10/2023 at 0700   carvedilol (COREG) 12.5 MG tablet Take 12.5 mg by mouth 2 (two) times daily with a meal.   09/10/2023 at 0700   isosorbide mononitrate (IMDUR) 30 MG 24 hr tablet TAKE 1/2 TABLET(15 MG) BY  MOUTH DAILY 45 tablet 3 09/10/2023 at 0700   JARDIANCE 10 MG TABS tablet Take 10 mg by mouth daily.   09/10/2023 at 0700   Magnesium 200 MG TABS Take 1 tablet (200 mg total) by mouth in the morning and at bedtime. 60 tablet 5 09/10/2023 at 0700   pantoprazole (PROTONIX) 40 MG tablet TAKE 1 TABLET BY MOUTH EVERY DAY 90 tablet 4 09/10/2023 at 0700   potassium chloride SA (KLOR-CON M) 20 MEQ tablet Take 2 tablets (40 mEq total) by mouth daily. (Patient taking differently: Take 20 mEq by mouth daily.) 180 tablet 1 09/10/2023 at 0700   torsemide (DEMADEX) 20 MG tablet Take 3 tablets (60 mg total) by mouth 2 (two) times daily. 180 tablet 5 09/10/2023 at 0700   traMADol (ULTRAM) 50 MG tablet Take 1 tablet (50 mg total) by mouth every 6 (six) hours as needed. 20 tablet 0 prn   triamcinolone ointment (KENALOG) 0.1 % APPLY TOPICALLY TWICE DAILY AS DIRECTED 454  g 3 prn   erythromycin ophthalmic ointment Place 1 Application into the left eye 3 (three) times daily. (Patient not taking: Reported on 09/10/2023)   Not Taking   ipratropium-albuterol (DUONEB) 0.5-2.5 (3) MG/3ML SOLN Inhale 3 mLs into the lungs every 4 (four) hours as needed (wheezing, shob). 360 mL 1    spironolactone (ALDACTONE) 25 MG tablet Take 1 tablet (25 mg total) by mouth daily. (Patient not taking: Reported on 09/10/2023) 30 tablet 1 Not Taking   Scheduled:   aspirin EC  81 mg Oral Daily   Chlorhexidine Gluconate Cloth  6 each Topical QHS   feeding supplement (NEPRO CARB STEADY)  237 mL Oral TID BM   insulin aspart  0-5 Units Subcutaneous QHS   insulin aspart  0-9 Units Subcutaneous TID WC   lactulose  10 g Oral BID   midodrine  5 mg Oral TID WC   multivitamin with minerals  1 tablet Oral Daily   mupirocin ointment  1 Application Nasal BID   pantoprazole  40 mg Oral Daily   sodium chloride flush  10 mL Intravenous Q12H   triamcinolone ointment   Topical BID   Infusions:   heparin 1,000 Units/hr (09/14/23 1100)   nitroGLYCERIN 60 mcg/min (09/14/23 1851)   PRN: acetaminophen, dextromethorphan-guaiFENesin, ipratropium-albuterol, morphine injection, ondansetron (ZOFRAN) IV Anti-infectives (From admission, onward)    Start     Dose/Rate Route Frequency Ordered Stop   09/12/23 1800  ceFEPIme (MAXIPIME) 1 g in sodium chloride 0.9 % 100 mL IVPB  Status:  Discontinued        1 g 200 mL/hr over 30 Minutes Intravenous Every 24 hours 09/10/23 2041 09/11/23 1042   09/11/23 1800  ceFEPIme (MAXIPIME) 1 g in sodium chloride 0.9 % 100 mL IVPB  Status:  Discontinued        1 g 200 mL/hr over 30 Minutes Intravenous Every 24 hours 09/10/23 2039 09/10/23 2041   09/10/23 2045  vancomycin (VANCOREADY) IVPB 1750 mg/350 mL        1,750 mg 175 mL/hr over 120 Minutes Intravenous  Once 09/10/23 2035 09/10/23 2356   09/10/23 2035  vancomycin variable dose per unstable renal function (pharmacist  dosing)  Status:  Discontinued         Does not apply See admin instructions 09/10/23 2035 09/11/23 1042   09/10/23 1630  levofloxacin (LEVAQUIN) IVPB 750 mg        750 mg 100 mL/hr over 90 Minutes Intravenous  Once 09/10/23 1628 09/10/23 1844  Assessment: 82 y.o. male past medical history significant for HFrEF (< 20%), CKD stage IV, hypertension, hyperlipidemia, diabetes, COPD on chronic home 2 L, CAD s/p CABG, history of atrial fibrillation on Eliquis, who presents to ED from home via EMS for fall x3. CBC stable. Trop 15. Pharmacy consulted to start heparin infusion for ACS. Pt has been off apixaban for at least 5 days, but I do not predict false elevation of the heparin level, but will order both aPTT and heparin level for the first draw. INR was elevated but now trending down.   Date/Time  aPTT/HL Rate  Comment 10/20 1944 161/0.84 1000 units/hr Supratherapeutic  Goal of Therapy:  Heparin level 0.3-0.7 units/ml Monitor platelets by anticoagulation protocol: Yes   Plan:  Both heparin level and aPTT SUPRAtherapeutic Decrease heparin infusion by 2 units/kg/hr to 850 units/hr Check anti-Xa level and aPTT in 8 hours and daily while on heparin Continue to monitor H&H and platelets  Merryl Hacker, PharmD Clinical Pharmacist 09/14/2023,8:25 PM

## 2023-09-15 ENCOUNTER — Ambulatory Visit: Payer: Medicare Other | Admitting: Family Medicine

## 2023-09-15 ENCOUNTER — Encounter: Payer: Medicare Other | Admitting: Family

## 2023-09-15 DIAGNOSIS — I5042 Chronic combined systolic (congestive) and diastolic (congestive) heart failure: Secondary | ICD-10-CM | POA: Diagnosis not present

## 2023-09-15 DIAGNOSIS — J189 Pneumonia, unspecified organism: Secondary | ICD-10-CM | POA: Diagnosis not present

## 2023-09-15 DIAGNOSIS — N179 Acute kidney failure, unspecified: Secondary | ICD-10-CM | POA: Diagnosis not present

## 2023-09-15 DIAGNOSIS — J449 Chronic obstructive pulmonary disease, unspecified: Secondary | ICD-10-CM | POA: Diagnosis not present

## 2023-09-15 LAB — BASIC METABOLIC PANEL
Anion gap: 10 (ref 5–15)
BUN: 87 mg/dL — ABNORMAL HIGH (ref 8–23)
CO2: 29 mmol/L (ref 22–32)
Calcium: 9.1 mg/dL (ref 8.9–10.3)
Chloride: 99 mmol/L (ref 98–111)
Creatinine, Ser: 3.09 mg/dL — ABNORMAL HIGH (ref 0.61–1.24)
GFR, Estimated: 20 mL/min — ABNORMAL LOW (ref >60)
Glucose, Bld: 138 mg/dL — ABNORMAL HIGH (ref 70–99)
Potassium: 3.5 mmol/L (ref 3.5–5.1)
Sodium: 138 mmol/L (ref 135–145)

## 2023-09-15 LAB — CULTURE, BLOOD (SINGLE)
Culture: NO GROWTH
Culture: NO GROWTH
Special Requests: ADEQUATE

## 2023-09-15 LAB — CBC
HCT: 33.2 % — ABNORMAL LOW (ref 39.0–52.0)
Hemoglobin: 10.4 g/dL — ABNORMAL LOW (ref 13.0–17.0)
MCH: 30.9 pg (ref 26.0–34.0)
MCHC: 31.3 g/dL (ref 30.0–36.0)
MCV: 98.5 fL (ref 80.0–100.0)
Platelets: 160 10*3/uL (ref 150–400)
RBC: 3.37 MIL/uL — ABNORMAL LOW (ref 4.22–5.81)
RDW: 17.8 % — ABNORMAL HIGH (ref 11.5–15.5)
WBC: 8.5 10*3/uL (ref 4.0–10.5)
nRBC: 0 % (ref 0.0–0.2)

## 2023-09-15 LAB — GLUCOSE, CAPILLARY
Glucose-Capillary: 119 mg/dL — ABNORMAL HIGH (ref 70–99)
Glucose-Capillary: 151 mg/dL — ABNORMAL HIGH (ref 70–99)
Glucose-Capillary: 120 mg/dL — ABNORMAL HIGH (ref 70–99)
Glucose-Capillary: 147 mg/dL — ABNORMAL HIGH (ref 70–99)

## 2023-09-15 LAB — PROTIME-INR
INR: 1.4 — ABNORMAL HIGH (ref 0.8–1.2)
Prothrombin Time: 17.1 s — ABNORMAL HIGH (ref 11.4–15.2)

## 2023-09-15 LAB — HEPARIN LEVEL (UNFRACTIONATED)
Heparin Unfractionated: 0.54 [IU]/mL (ref 0.30–0.70)
Heparin Unfractionated: 0.38 [IU]/mL (ref 0.30–0.70)

## 2023-09-15 LAB — APTT: aPTT: 93 s — ABNORMAL HIGH (ref 24–36)

## 2023-09-15 MED ORDER — COLCHICINE 0.6 MG PO TABS
1.2000 mg | ORAL_TABLET | Freq: Once | ORAL | Status: AC
  Administered 2023-09-15: 1.2 mg via ORAL
  Filled 2023-09-15: qty 2

## 2023-09-15 MED ORDER — TRIAMCINOLONE ACETONIDE 0.1 % EX CREA
TOPICAL_CREAM | Freq: Two times a day (BID) | CUTANEOUS | Status: DC
  Filled 2023-09-15 (×2): qty 15

## 2023-09-15 NOTE — Progress Notes (Signed)
PT Cancellation Note  Patient Details Name: Taylor [name] MRN: [id number] DOB: 1941-02-05   Cancelled Treatment:    Reason Eval/Treat Not Completed: Medical issues which prohibited therapy (Per discussion with attending, advises hold on therapy services this date due to medical status.  Will continue to follow, re-attempting/re-engaging as medically appropriate.)  [name] H. Manson Passey, PT, DPT, NCS 09/15/23, 2:09 PM [phone]

## 2023-09-15 NOTE — Progress Notes (Signed)
Palliative Care Progress Note, Assessment & Plan   Patient Name: Taylor [name]       Date: 09/15/2023 DOB: 12-21-1940  Age: 82 y.o. MRN#: [id number] Attending Physician: Sunnie Nielsen, DO Primary Care Physician: Malva Limes, MD Admit Date: 09/10/2023  Subjective: Patient is lying flat in bed, is asleep, but easily awakens to my presence.  He is able to make his wishes known.  His wife and her brother are at bedside during my visit.  HPI: 82 y.o. male  with past medical history of systolic CHF (EF less than 20%), CKD (stage IV), HTN, HLD, type 2 diabetes, COPD (2L supplemental oxygen), CAD with stent and history of CABG, stroke, A-fib (Eliquis), PPM with ICD, melanoma, psoriatic arthritis, pancreatitis, and an alcoholic liver cirrhosis admitted on 09/10/2023 with fall.    He is being treated for severe sepsis due to HCAP, acute renal failure superimposed on stage IV CKD, hyperkalemia, hyperphosphatemia, and hypotension.  Course of hospitalization has been complicated by chest pain relieved with nitro and heparin gtt.  Not a candidate for cardiac catheterization given his elevated creatinine.  PMT was consulted to discuss goals of care.  Summary of counseling/coordination of care: Extensive chart review completed prior to meeting patient including labs, vital signs, imaging, progress notes, orders, and available advanced directive documents from current and previous encounters.   After reviewing the patient's chart and assessing the patient at bedside, I spoke with patient and family in regards to symptom management and goals of care.  Symptoms assessed.  Patient has acute new onset of pain on his right knee.  Upon physical assessment, light touch made patient wince in pain.  He endorses that  physically touching his skin since shooting pains throughout his knee.  Knee is not warm to touch and no abnormalities palpated.  Patient has a history of gout.  Discussed with attending and patient likely experiencing gouty attack.  Primary to initiate colchicine.  I discussed use of nitro and heparin with patient and family.  Shared my concern that patient's chest pain is related to his worsening heart failure and that relief with nitro gtt. is not sustainable outside of the hospital.  I highlighted Dr. Mardelle Matte comments that patient has the option of comfort measures.  Patient and family would like to continue with current regimen.  No adjustment to plan of care at this time.  Above conveyed to attending.  DNR with limited interventions remains.  PMT will continue to follow and support patient had his hospitalization.  Physical Exam Vitals reviewed.  Constitutional:      Appearance: He is ill-appearing.  HENT:     Head: Normocephalic.     Mouth/Throat:     Mouth: Mucous membranes are moist.  Eyes:     Pupils: Pupils are equal, round, and reactive to light.  Abdominal:     General: There is no distension.     Palpations: Abdomen is soft.  Musculoskeletal:     Comments: Generalize weakness Pain in right knee  Skin:    General: Skin is warm and dry.     Findings: Bruising present.  Neurological:     Mental Status: He is alert and oriented to person,  place, and time.  Psychiatric:        Mood and Affect: Mood normal.        Behavior: Behavior normal.        Thought Content: Thought content normal.        Judgment: Judgment normal.             Total Time 50 minutes   Time spent includes: Detailed review of medical records (labs, imaging, vital signs), medically appropriate exam (mental status, respiratory, cardiac, skin), discussed with treatment team, counseling and educating patient, family and staff, documenting clinical information, medication management and coordination of  care.  Samara Deist L. Bonita Quin, DNP, FNP-BC Palliative Medicine Team

## 2023-09-15 NOTE — Consult Note (Signed)
PHARMACY - ANTICOAGULATION CONSULT NOTE  Pharmacy Consult for Heparin  Indication: chest pain/ACS  Allergies  Allergen Reactions   Amlodipine Besylate Swelling    Had a reaction when taking with colcrys    Crestor [Rosuvastatin]     Muscle cramps and pain. Tolerates atorvastatin    Patient Measurements: Height: 5\' 10"  (177.8 cm) Weight: 75.8 kg (167 lb 1.7 oz) IBW/kg (Calculated) : 73 Heparin Dosing Weight: 71.8 kg  Vital Signs: Temp: 97.7 F (36.5 C) (10/21 0300) Temp Source: Axillary (10/21 0300) Pulse Rate: 59 (10/21 0300)  Labs: Recent Labs    09/13/23 0251 09/14/23 0313 09/14/23 1944 09/15/23 0328 09/15/23 0653  HGB  --   --   --  10.4*  --   HCT  --   --   --  33.2*  --   PLT  --   --   --  160  --   APTT  --   --  161*  --  93*  LABPROT 20.2* 18.2*  --  17.1*  --   INR 1.7* 1.5*  --  1.4*  --   HEPARINUNFRC  --   --  0.87*  --  0.54  CREATININE 5.17* 4.10*  --  3.09*  --     Estimated Creatinine Clearance: 19.7 mL/min (A) (by C-G formula based on SCr of 3.09 mg/dL (H)).   Medical History: Past Medical History:  Diagnosis Date   AAA (abdominal aortic aneurysm) (HCC) 06/03/2007   [facility]; Dr. Hart Rochester   AICD (automatic cardioverter/defibrillator) present    Arrhythmia    atrial fibrillation   Barrett's esophagus    Bladder cancer (HCC)    Bradycardia    CAD (coronary artery disease)    CAP (community acquired pneumonia) 11/13/2019   CHF (congestive heart failure) (HCC)    Cluster headache    COVID-19 09/27/2021   DDD (degenerative disc disease), lumbar    Diabetes mellitus without complication (HCC)    Dyspnea    WITH EXERTION   Edema    LEFT ANKLE   Fracture of skull base (HCC) 1997   due to fall   GERD (gastroesophageal reflux disease)    Gout    History of bladder cancer 12/1995   Hyperlipidemia    Hypertension    Hypocalcemia 04/19/2020   Possibly secondary to diuretics.   Low=5.9 04/19/2020   Malignant melanoma  (HCC) 12/2012   right dorsal forearm excised   Myocardial infarction [facility])    LAST 2014   Osteoarthritis of knee    Other specified complication of vascular prosthetic devices, implants and grafts, initial encounter (HCC) 08/22/2021   Pacemaker 10/10/2006   Pancreatitis 11/22/2019   Pneumonia    2016   Pre-diabetes    Psoriasis    Rib fracture 1997   due to fall   Sleep apnea    CPAP   Stroke (HCC)    Venous incompetence     Assessment: 82 y.o. male past medical history significant for HFrEF (< 20%), CKD stage IV, hypertension, hyperlipidemia, diabetes, COPD on chronic home 2 L, CAD s/p CABG, history of atrial fibrillation on Eliquis, who presents to ED from home via EMS for fall x3. CBC stable. Trop 15. Pharmacy consulted to start heparin infusion for ACS. Pt has been off apixaban for at least 5 days, but I do not predict false elevation of the heparin level, but will order both aPTT and heparin level for the first draw. INR was elevated but now  trending down.   Date/Time  aPTT/HL Rate  Comment 10/20 1944 161/0.84 1000 units/hr Supra-therapeutic 10/21 0653  93/0.54  850 units/hr Therapeutic x 1  Goal of Therapy:  Heparin level 0.3-0.7 units/ml Monitor platelets by anticoagulation protocol: Yes   Plan:  HL therapeutic x 1 with heparin infusion rate at 850 units/hr. Will continue current rate and repeat HL in 8 hours to confirm therapeutic rate. Continue to monitor H&H and platelets per protocol.  Kailan Laws Rodriguez-Guzman PharmD, BCPS 09/15/2023 8:03 AM

## 2023-09-15 NOTE — Progress Notes (Signed)
OT Cancellation Note  Patient Details Name: Taylor [name] MRN: [id number] DOB: 27-Jun-1941   Cancelled Treatment:    Reason Eval/Treat Not Completed: Medical issues which prohibited therapy. Per discussion with attending, advises hold on therapy services this date due to medical status.  Will continue to follow, re-attempting/re-engaging as medically appropriate.   Kathie Dike, M.S. OTR/L  09/15/23, 2:18 PM  ascom [phone]

## 2023-09-15 NOTE — Progress Notes (Signed)
[facility] [place] 09/15/23  Subjective:   Hospital day # 5 Patient with medical problems of sCHF with EF < 20%, CKD-4, HTN, HLD, DM, COPD on 2L O2, CAD with stent and hx of CABG, stroke, A fib on Eliquis, s/p of AICD, melanoma, psoriatic arthritis, pancreatitis, liver cirrhosis with ascites (shown by recent CT scan on 08/29/23)  Known to our practice from outpatient f/u of CKD  Resting quietly in bed Wife at bedside No complaints to offer  Good urine output Creatinine improving  Renal: 10/20 0701 - 10/21 0700 In: 10 [I.V.:10] Out: 1175 [Urine:1175] Lab Results  Component Value Date   CREATININE 3.09 (H) 09/15/2023   CREATININE 4.10 (H) 09/14/2023   CREATININE 5.17 (H) 09/13/2023     Objective:  Vital signs in last 24 hours:  Temp:  [97.6 F (36.4 C)-98.8 F (37.1 C)] 98.2 F (36.8 C) (10/21 1200) Pulse Rate:  [59-77] 77 (10/21 1200) Resp:  [18-30] 18 (10/21 1200) BP: (106-141)/(68-92) 106/73 (10/21 1200) SpO2:  [98 %-99 %] 99 % (10/21 1200)  Weight change:  Filed Weights   09/10/23 2300 09/11/23 0500 09/12/23 0500  Weight: 71.8 kg 71.8 kg 75.8 kg    Intake/Output:    Intake/Output Summary (Last 24 hours) at 09/15/2023 1429 Last data filed at 09/15/2023 1038 Gross per 24 hour  Intake 250 ml  Output 1415 ml  Net -1165 ml     Physical Exam: General: Ill-appearing, laying in the bed  HEENT Multiple bruises from facial fractures  Pulm/lungs Coarse breath sounds, normal breathing effort  CVS/Heart Irregular rhythm  Abdomen:  Soft, nontender, nondistended  Extremities: Trace edema  Neurologic: Alert, able to follow commands  Skin: Multiple scattered ecchymosis          Basic Metabolic Panel:  Recent Labs  Lab 09/10/23 1606 09/10/23 1801 09/11/23 0330 09/11/23 1035 09/11/23 1754 09/12/23 0500 09/13/23 0251 09/14/23 0313 09/15/23 0328  NA 135  --  132*   < > 132* 131* 135 137 138  K 6.8*   < > 6.0*   < > 5.0 4.4  4.0 3.9 3.5  CL 95*  --  95*   < > 90* 90* 91* 96* 99  CO2 25  --  24   < > 26 26 27 29 29   GLUCOSE 173*  --  90   < > 174* 142* 127* 123* 138*  BUN 93*  --  99*   < > 96* 104* 108* 97* 87*  CREATININE 5.46*  --  4.95*   < > 5.27* 5.17* 5.17* 4.10* 3.09*  CALCIUM 8.9  --  8.7*   < > 9.1 8.8* 8.7* 8.9 9.1  MG 2.8*  --  2.3  --   --  2.3 2.2  --   --   PHOS 6.5*  --  5.9*  --   --  7.0* 5.8*  --   --    < > = values in this interval not displayed.     CBC: Recent Labs  Lab 09/10/23 1606 09/11/23 0330 09/12/23 0500 09/15/23 0328  WBC 13.9* 9.7 9.9 8.5  NEUTROABS 12.0*  --   --   --   HGB 12.3* 11.0* 11.1* 10.4*  HCT 40.2 35.3* 35.0* 33.2*  MCV 101.0* 98.9 97.0 98.5  PLT 133* 129* 152 160     No results found for: "HEPBSAG", "HEPBSAB", "HEPBIGM"    Microbiology:  Recent Results (from the past 240 hour(s))  Blood culture (routine single)  Status: None   Collection Time: 09/10/23  4:06 PM   Specimen: BLOOD  Result Value Ref Range Status   Specimen Description   Final    BLOOD LEFT ANTECUBITAL Performed at [facility], [address] [place], [place] [address]    Special Requests   Final    BOTTLES DRAWN AEROBIC AND ANAEROBIC Blood Culture adequate volume Performed at [facility], [address], [place], [place] [address]    Culture   Final    NO GROWTH 5 DAYS Performed at [facility], [address] [place], [place] [address]    Report Status 09/15/2023 FINAL  Final  Culture, blood (single)     Status: None   Collection Time: 09/10/23  5:02 PM   Specimen: BLOOD  Result Value Ref Range Status   Specimen Description BLOOD BLOOD RIGHT FOREARM  Final   Special Requests   Final    BOTTLES DRAWN AEROBIC AND ANAEROBIC Blood Culture results may not be optimal due to an inadequate volume of blood received in culture bottles   Culture   Final    NO GROWTH 5 DAYS Performed at [facility], [address] [place], [place]  [address]    Report Status 09/15/2023 FINAL  Final  SARS Coronavirus 2 by RT PCR (hospital order, performed in [facility] hospital lab) *cepheid single result test* Anterior Nasal Swab     Status: None   Collection Time: 09/10/23  9:38 PM   Specimen: Anterior Nasal Swab  Result Value Ref Range Status   SARS Coronavirus 2 by RT PCR NEGATIVE NEGATIVE Final    Comment: (NOTE) SARS-CoV-2 target nucleic acids are NOT DETECTED.  The SARS-CoV-2 RNA is generally detectable in upper and lower respiratory specimens during the acute phase of infection. The lowest concentration of SARS-CoV-2 viral copies this assay can detect is 250 copies / mL. A negative result does not preclude SARS-CoV-2 infection and should not be used as the sole basis for treatment or other patient management decisions.  A negative result may occur with improper specimen collection / handling, submission of specimen other than nasopharyngeal swab, presence of viral mutation(s) within the areas targeted by this assay, and inadequate number of viral copies (<250 copies / mL). A negative result must be combined with clinical observations, patient history, and epidemiological information.  Fact Sheet for Patients:   RoadLapTop.co.za  Fact Sheet for Healthcare Providers: http://kim-miller.com/  This test is not yet approved or  cleared by the [place] FDA and has been authorized for detection and/or diagnosis of SARS-CoV-2 by FDA under an Emergency Use Authorization (EUA).  This EUA will remain in effect (meaning this test can be used) for the duration of the COVID-19 declaration under Section 564(b)(1) of the Act, 21 U.S.C. section 360bbb-3(b)(1), unless the authorization is terminated or revoked sooner.  Performed at [facility], [address] [place], [place] [address]   Respiratory (~20 pathogens) panel by PCR     Status: None   Collection Time: 09/10/23   9:38 PM   Specimen: Nasopharyngeal Swab; Respiratory  Result Value Ref Range Status   Adenovirus NOT DETECTED NOT DETECTED Final   Coronavirus 229E NOT DETECTED NOT DETECTED Final    Comment: (NOTE) The Coronavirus on the Respiratory Panel, DOES NOT test for the novel  Coronavirus (2019 nCoV)    Coronavirus HKU1 NOT DETECTED NOT DETECTED Final   Coronavirus NL63 NOT DETECTED NOT DETECTED Final   Coronavirus OC43 NOT DETECTED  NOT DETECTED Final   Metapneumovirus NOT DETECTED NOT DETECTED Final   Rhinovirus / Enterovirus NOT DETECTED NOT DETECTED Final   Influenza A NOT DETECTED NOT DETECTED Final   Influenza B NOT DETECTED NOT DETECTED Final   Parainfluenza Virus 1 NOT DETECTED NOT DETECTED Final   Parainfluenza Virus 2 NOT DETECTED NOT DETECTED Final   Parainfluenza Virus 3 NOT DETECTED NOT DETECTED Final   Parainfluenza Virus 4 NOT DETECTED NOT DETECTED Final   Respiratory Syncytial Virus NOT DETECTED NOT DETECTED Final   Bordetella pertussis NOT DETECTED NOT DETECTED Final   Bordetella Parapertussis NOT DETECTED NOT DETECTED Final   Chlamydophila pneumoniae NOT DETECTED NOT DETECTED Final   Mycoplasma pneumoniae NOT DETECTED NOT DETECTED Final    Comment: Performed at [facility], [address], [place], [place] [address]  MRSA Next Gen by PCR, Nasal     Status: Abnormal   Collection Time: 09/10/23 11:04 PM   Specimen: Nasal Mucosa; Nasal Swab  Result Value Ref Range Status   MRSA by PCR Next Gen DETECTED (A) NOT DETECTED Final    Comment: RESULT CALLED TO, READ BACK BY AND VERIFIED WITH: MARIA CONZALES@0105  09/11/23 RH (NOTE) The GeneXpert MRSA Assay (FDA approved for NASAL specimens only), is one component of a comprehensive MRSA colonization surveillance program. It is not intended to diagnose MRSA infection nor to guide or monitor treatment for MRSA infections. Test performance is not FDA approved in patients less than 57 years old. Performed at [facility], [address] [place], [place] [address]     Coagulation Studies: Recent Labs    09/13/23 0251 09/14/23 0313 09/15/23 0328  LABPROT 20.2* 18.2* 17.1*  INR 1.7* 1.5* 1.4*    Urinalysis: No results for input(s): "COLORURINE", "LABSPEC", "PHURINE", "GLUCOSEU", "HGBUR", "BILIRUBINUR", "KETONESUR", "PROTEINUR", "UROBILINOGEN", "NITRITE", "LEUKOCYTESUR" in the last 72 hours.  Invalid input(s): "APPERANCEUR"     Imaging: No results found.   Medications:    heparin 850 Units/hr (09/15/23 0945)   nitroGLYCERIN 60 mcg/min (09/15/23 0527)     aspirin EC  81 mg Oral Daily   Chlorhexidine Gluconate Cloth  6 each Topical QHS   colchicine  1.2 mg Oral Once   feeding supplement (NEPRO CARB STEADY)  237 mL Oral TID BM   insulin aspart  0-5 Units Subcutaneous QHS   insulin aspart  0-9 Units Subcutaneous TID WC   lactulose  10 g Oral BID   midodrine  5 mg Oral TID WC   multivitamin with minerals  1 tablet Oral Daily   pantoprazole  40 mg Oral Daily   sodium chloride flush  10 mL Intravenous Q12H   triamcinolone cream   Topical BID   acetaminophen, dextromethorphan-guaiFENesin, ipratropium-albuterol, morphine injection, ondansetron (ZOFRAN) IV  Assessment/ Plan:  82 y.o. male with sCHF with EF < 20%, CKD-4, HTN, HLD, DM, COPD on 2L O2, CAD with stent and hx of CABG, stroke, A fib on Eliquis, s/p of AICD, melanoma, psoriatic arthritis, pancreatitis, liver cirrhosis with ascites (noted on recent CT scan on 08/29/23) admitted on 09/10/2023 for Hyperkalemia [E87.5] SIRS (systemic inflammatory response syndrome) (HCC) [R65.10] AKI (acute kidney injury) (HCC) [N17.9] Injury of head, initial encounter [S09.90XA] Fall, initial encounter [W19.XXXA] Acute kidney injury superimposed on stage 4 chronic kidney disease (HCC) [N17.9, N18.4]  Acute kidney injury on chronic kidney disease stage IV Patient followed by our practice for chronic kidney disease. Most recent outpatient  labs-creatinine 2.65/GFR 24 from 07/01/2023. AKI likely secondary to volume depletion  and concurrent illness. Creatinine continues to show steady improvements. Remains a poor candidate for dialysis due to reduced EF with other commodities.   Hyperkalemia Potassium level of 6.8 at presentation. Corrected Continue holding potassium and spironolactone. Continue conservative measures.    Pneumonia/sepsis/lactic acidosis Completed antibiotics Continue supportive care.    LOS: 5 Wendee Beavers 10/21/20242:30 PM  [address] [place], [place] [phone]

## 2023-09-15 NOTE — Consult Note (Signed)
PHARMACY - ANTICOAGULATION CONSULT NOTE  Pharmacy Consult for Heparin  Indication: chest pain/ACS  Allergies  Allergen Reactions   Amlodipine Besylate Swelling    Had a reaction when taking with colcrys    Crestor [Rosuvastatin]     Muscle cramps and pain. Tolerates atorvastatin    Patient Measurements: Height: 5\' 10"  (177.8 cm) Weight: 75.8 kg (167 lb 1.7 oz) IBW/kg (Calculated) : 73 Heparin Dosing Weight: 71.8 kg  Vital Signs: Temp: 98.2 F (36.8 C) (10/21 1200) Temp Source: Oral (10/21 1200) BP: 106/73 (10/21 1200) Pulse Rate: 77 (10/21 1200)  Labs: Recent Labs    09/13/23 0251 09/14/23 0313 09/14/23 1944 09/15/23 0328 09/15/23 0653 09/15/23 1436  HGB  --   --   --  10.4*  --   --   HCT  --   --   --  33.2*  --   --   PLT  --   --   --  160  --   --   APTT  --   --  161*  --  93*  --   LABPROT 20.2* 18.2*  --  17.1*  --   --   INR 1.7* 1.5*  --  1.4*  --   --   HEPARINUNFRC  --   --  0.87*  --  0.54 0.38  CREATININE 5.17* 4.10*  --  3.09*  --   --     Estimated Creatinine Clearance: 19.7 mL/min (A) (by C-G formula based on SCr of 3.09 mg/dL (H)).   Medical History: Past Medical History:  Diagnosis Date   AAA (abdominal aortic aneurysm) (HCC) 06/03/2007   [facility]; Dr. Hart Rochester   AICD (automatic cardioverter/defibrillator) present    Arrhythmia    atrial fibrillation   Barrett's esophagus    Bladder cancer (HCC)    Bradycardia    CAD (coronary artery disease)    CAP (community acquired pneumonia) 11/13/2019   CHF (congestive heart failure) (HCC)    Cluster headache    COVID-19 09/27/2021   DDD (degenerative disc disease), lumbar    Diabetes mellitus without complication (HCC)    Dyspnea    WITH EXERTION   Edema    LEFT ANKLE   Fracture of skull base (HCC) 1997   due to fall   GERD (gastroesophageal reflux disease)    Gout    History of bladder cancer 12/1995   Hyperlipidemia    Hypertension    Hypocalcemia 04/19/2020    Possibly secondary to diuretics.   Low=5.9 04/19/2020   Malignant melanoma (HCC) 12/2012   right dorsal forearm excised   Myocardial infarction [facility])    LAST 2014   Osteoarthritis of knee    Other specified complication of vascular prosthetic devices, implants and grafts, initial encounter (HCC) 08/22/2021   Pacemaker 10/10/2006   Pancreatitis 11/22/2019   Pneumonia    2016   Pre-diabetes    Psoriasis    Rib fracture 1997   due to fall   Sleep apnea    CPAP   Stroke (HCC)    Venous incompetence     Assessment: 82 y.o. male past medical history significant for HFrEF (< 20%), CKD stage IV, hypertension, hyperlipidemia, diabetes, COPD on chronic home 2 L, CAD s/p CABG, history of atrial fibrillation on Eliquis, who presents to ED from home via EMS for fall x3. CBC stable. Trop 15. Pharmacy consulted to start heparin infusion for ACS. Pt has been off apixaban for at least 5 days,  but I do not predict false elevation of the heparin level, but will order both aPTT and heparin level for the first draw. INR was elevated but now trending down.   Date/Time  aPTT/HL Rate  Comment 10/20 1944 161/0.84 1000 units/hr Supra-therapeutic 10/21 0653  93/0.54  850 units/hr Therapeutic x 1 10/21 1436  -- / 0.38  850 un/hr Therapeutic x 2    Goal of Therapy:  Heparin level 0.3-0.7 units/ml Monitor platelets by anticoagulation protocol: Yes   Plan:  HL therapeutic x 2 with heparin infusion rate at 850 units/hr. Will continue current rate and repeat HL with AM labs. Continue to monitor H&H and platelets per protocol.  Bud Kaeser Rodriguez-Guzman PharmD, BCPS 09/15/2023 3:21 PM

## 2023-09-15 NOTE — Progress Notes (Addendum)
PROGRESS NOTE    Taylor [name]   ONG:[id number] DOB: 04-30-1941  DOA: 09/10/2023 Date of Service: 09/15/23 which is hospital day 5  PCP: Malva Limes, MD    HPI: Taylor [name] is a 82 y.o. male past medical history significant for HFrEF (< 20%), CKD stage IV, hypertension, hyperlipidemia, diabetes, COPD on chronic home 2 L, CAD, history of atrial fibrillation on Eliquis, who presents to ED from home via EMS for fall x3. Multiple falls past month. Concern for low BP. No LOC.   Pertinent recent tx/imaging: 10/04: visit ED due to fall - (+)minimally displaced and comminuted radial styloid fracture --> splint to left forearm. (+)fx nasal bones and L maxilla.   10/10: ENT visit, discuss repair options for facial fx, not interested, conservative mgt.   Hospital course / significant events:  10/16: to ED. (+)sepsis, severe - hypothermic Tmin 72F, low BP 80s/60s, AKI, hyperkalemic K max 6.8 improved w/ tx, Cr on admission 5.46 above baseline 2.7, lactic max 3.8. CT (+)PNA RML and LLL, mod R pleural effusion also noted on previous CT. Tx midodrine, steroids, fluids, vanc/cefepime.  10/17: BP improved, renal fxn and K improved, Echo LVEF <20%, severely decreased fxn and global hypokinesis, grade III diastolic dysfunction, severe RV overload, severe RA and LA dilation. Renal US pending read. Palliative care consult, see note. Cardiology consult 10/18: stable off pressors, transfer to progressive unit. Continue to monitor renal/cardiac function closely.  10/19: no improvement in renal function, UOP improved, continue to monitor.  10/20: some improved renal fxn, continued chest pain, agrees for DNR/DNI see A/P below. Worsening chest pain, requiring nitroglycerin gtt, per cardiology not candidate for intervention so will not get troponin, will cont nitroglycerin and heparin gtt for 24-48 hours.  10/21: remains on drips, palliative care following, pt/family desiring aggressive measures except  CPR/resuscitation in event of arrest.   Consultants:  Nephrology PCCU Palliative care  Cardiology, advanced HF team   Procedures/Surgeries: none      ASSESSMENT & PLAN:    INFECTIOUS   Severe sepsis due to HCAP on CT, RML, LLL. POA (+)MRSA screen, pending cultures  Neg COVID and resp viral PCR  meets criteria for severe sepsis with WBC 13.9, hypothermia with temperature 92.1.  Lactic acid is elevated at 3.8 --> 2.9. Patient has been persistently hypotensive. Completed abx  Blood and sputum cultures, urine antigen of Legionella and strep Midodrine continuing  Hypothermia - resolved Tx sepsis as above     RENAL  Acute renal failure superimposed on stage 4 chronic kidney disease Hyperkalemia and hyperphosphatemia likely as result of AKI  creatinine 5.46, BUN 93 and GFR 10 (recent baseline creatinine 2.82 and GFR 22 on 10//24) --> Cr 5.17 today 09/13/23  Ddx include UTI, dehydration and continuation of diuretics.  ATN is possible due to hypotension. Question hepatorenal syndrome.   Nephrology and cardiology following  Hold nephrotoxic rx  Poor candidate for long term dialysis   Hyperkalemia: resolved Nephrology following  Monitor BMP  Hyponatremia likely d/t AKI resolved Monitor BMP Treat underlying causes    CARDIAC  Persistent chest pain / unstable angina Continue nitrog gtt and heparin gtt Cardiology following If cardiopulmonary decompensation such that death is imminent, have ordered morphine push asap and RN to alert hospitalist coverage prior to administration of this, essentially if pulseless / fatal arrhythmia will do what we can to relieve pain but I have advised the patient that decompensation may be sudden and we may not be able to help  his pain if/when that occurs  Chronic combined systolic and diastolic CHF (congestive heart failure)  2D echo on 10/30/2022 showed EF<20%.   Patient has elevated BNP 647 which is below his baseline, no leg edema or  JVD.  Does not seem to have CHF exacerbation Echo this admission: Echo LVEF <20%, severely decreased fxn and global hypokinesis, grade III diastolic dysfunction, severe RV overload, severe RA and LA dilation Hold diuretics due to severe sepsis, Nephrology ok for resume low dose on discharge  Watch volume status closely  Cardiology following - no plans for further diagnostics at this time Very poor prognosis, family and patient agreeable for no CPR but are not interested in comfort measures only at this time despite worsening chest pain requiring nitrog drip which he may not be able to come off of without severe pain.   Hypotension Likely multifactorial - DDx including severe sepsis, dehydration and continuation for diuretics.  Hepatorenal syndrome is potential differential diagnosis. Low CO d/t CHF.  In ED, received solu-cortef, midodrine, albumin  Tx sepsis as above  Midodrine Phenylephrine d/c 10/17 morning - monitor and low threshold to restart pressors Cardiology team following Echocardiogram --> severe disease see below   Hx of CAD S/P percutaneous coronary angioplasty and myocardial injury:  trop  19 and trended down   Denies chest pain Continue Aspirin hold lipitor due to liver cirrhosis hold imdur due to hypotension   Essential (primary) hypertension Hold blood pressure medications due to hypotension   Atrial fibrillation (HCC): rate controlled/bradycardic, HR 50-60s Hold Coreg d/t bradycardia and hypotension  Hold Eliquis since patient has frequent fall On heparin now for angina  Hypercholesteremia hold Lipitor d/t liver disease    RESPIRATORY   Chronic obstructive pulmonary disease, unspecified COPD type Chronic respiratory failure on 2L O2 [place] at home  Bronchodilators, as needed Mucinex Supplemental O2 as needed  Sore throat / sinus dryness today, humidify O2     NEUROLOGIC   History of CVA (cerebrovascular accident) Aspirin   HEMATOLOGIC  Elevated INR  and thrombocytopenia likely d/t liver disease monitor   GASTRO  Liver cirrhosis  Non-alcoholic Liver Cirrhosis with ascites  Hyperammonemia, mild - elevated 42.  Patient is alert, oriented x 3. Start lactulose 10 g twice daily   ENDOCRINE  Diabetes Type 2, well controlled w/ A1C 6.6 Monitor Glc w/ daily labs SSI if needed   OTHER  Advanced care planning - discussion 09/12/23 and 09/14/23  D/w patient and wife on rounds this 10/18 - cardiac/renal prognosis is poor and I do not believe he would recover if he were to decompensate (worsening HF, hypotension not responding to pressors, cardiac arrest/arhythmia). I was clear that I recommend against CPR as this would be painful without reasonable chance of benefit. Alternative would be that if he were to decompensate such that death felt to be imminent, we would ensure he was not in pain or distress. We would do any and all treatments short of CPR/intubation, meaning a no-CPR status would not mean "do nothing"  Revisited this issue again this morning 09/14/23. Pt having chest pain on and off. I discussed with him that this could mean heart attack which could decompensate quickly / unpredictably, vs angina that is likely to come and go indefinitely but is an indicator of worsening heart disease. I again stated that CPR is certain to cause significant pain/stress and is very unlikely to result in any meaningful benefit. He agrees for DO NOT RESUSCITATE as in NO CPR/intubation. RN Shanda Bumps  T present for this conversation approx 10:00 09/14/23 and I will discuss with family later today when they are here. - see note See above re persistent chest pain / angina  Fall fall precaution PT/OT Holding Eliquis   Obstructive sleep apnea CPAP as toleratred   Recent radial styloid fracture_left:: on splint prn tylenol   Knee pain suspect gout flare No effusion evident Colchicine Low threshold ortho consult for aspiration but no obvious effusion on  exam Monitor     underweight based on BMI: Body mass index is 23.98 kg/m. Concern for mild-moderate protein calorie malnutrition based on BMI, low albumin, multiple medical comorbidities     DVT prophylaxis: SCD, ASA. High bleed risk w/ elevation INR and has been on Eliquis, multiple falls  IV fluids: no continuous IV fluids  Nutrition: cardiac/carb diet  Central lines / invasive devices: none  Code Status: FULL CODE ACP documentation reviewed: none on file in [facility]  TOC needs: TBD Barriers to dispo / significant pending items: clinical improvement, expect may not be able to come off nitrog gtt, monitor and reinvolve palliative prn              Subjective / Brief ROS:  Patient reports chest pain and SOB are improved, he's resting in bed, but any time he gets up he has more pain   Family Communication: family at bedside on rounds     Objective Findings:  Vitals:   09/14/23 2300 09/15/23 0300 09/15/23 0800 09/15/23 1200  BP:   115/76 106/73  Pulse: 64 (!) 59  77  Resp:   (!) 29 18  Temp: 97.6 F (36.4 C) 97.7 F (36.5 C) 98.8 F (37.1 C) 98.2 F (36.8 C)  TempSrc: Oral Axillary Oral Oral  SpO2: 99% 99% 98% 99%  Weight:      Height:        Intake/Output Summary (Last 24 hours) at 09/15/2023 1401 Last data filed at 09/15/2023 1038 Gross per 24 hour  Intake 250 ml  Output 1415 ml  Net -1165 ml   Filed Weights   09/10/23 2300 09/11/23 0500 09/12/23 0500  Weight: 71.8 kg 71.8 kg 75.8 kg    Examination:  Physical Exam Constitutional:      Appearance: He is ill-appearing.  Cardiovascular:     Rate and Rhythm: Normal rate. Rhythm irregular.     Heart sounds: Murmur heard.     Comments: Heart sounds distant Pulmonary:     Effort: Pulmonary effort is normal. No respiratory distress.     Breath sounds: Rales present.     Comments: Breath sounds diffusely diminished Skin:    General: Skin is warm and dry.  Neurological:     General: No focal  deficit present.     Mental Status: He is alert and oriented to person, place, and time. Mental status is at baseline.  Psychiatric:        Mood and Affect: Mood normal.        Behavior: Behavior normal.          Scheduled Medications:   aspirin EC  81 mg Oral Daily   Chlorhexidine Gluconate Cloth  6 each Topical QHS   colchicine  1.2 mg Oral Once   feeding supplement (NEPRO CARB STEADY)  237 mL Oral TID BM   insulin aspart  0-5 Units Subcutaneous QHS   insulin aspart  0-9 Units Subcutaneous TID WC   lactulose  10 g Oral BID   midodrine  5 mg Oral TID  WC   multivitamin with minerals  1 tablet Oral Daily   pantoprazole  40 mg Oral Daily   sodium chloride flush  10 mL Intravenous Q12H   triamcinolone cream   Topical BID    Continuous Infusions:  heparin 850 Units/hr (09/15/23 0945)   nitroGLYCERIN 60 mcg/min (09/15/23 0527)     PRN Medications:  acetaminophen, dextromethorphan-guaiFENesin, ipratropium-albuterol, morphine injection, ondansetron (ZOFRAN) IV  Antimicrobials from admission:  Anti-infectives (From admission, onward)    Start     Dose/Rate Route Frequency Ordered Stop   09/12/23 1800  ceFEPIme (MAXIPIME) 1 g in sodium chloride 0.9 % 100 mL IVPB  Status:  Discontinued        1 g 200 mL/hr over 30 Minutes Intravenous Every 24 hours 09/10/23 2041 09/11/23 1042   09/11/23 1800  ceFEPIme (MAXIPIME) 1 g in sodium chloride 0.9 % 100 mL IVPB  Status:  Discontinued        1 g 200 mL/hr over 30 Minutes Intravenous Every 24 hours 09/10/23 2039 09/10/23 2041   09/10/23 2045  vancomycin (VANCOREADY) IVPB 1750 mg/350 mL        1,750 mg 175 mL/hr over 120 Minutes Intravenous  Once 09/10/23 2035 09/10/23 2356   09/10/23 2035  vancomycin variable dose per unstable renal function (pharmacist dosing)  Status:  Discontinued         Does not apply See admin instructions 09/10/23 2035 09/11/23 1042   09/10/23 1630  levofloxacin (LEVAQUIN) IVPB 750 mg        750 mg 100 mL/hr  over 90 Minutes Intravenous  Once 09/10/23 1628 09/10/23 1844           Data Reviewed:  I have personally reviewed the following...  CBC: Recent Labs  Lab 09/10/23 1606 09/11/23 0330 09/12/23 0500 09/15/23 0328  WBC 13.9* 9.7 9.9 8.5  NEUTROABS 12.0*  --   --   --   HGB 12.3* 11.0* 11.1* 10.4*  HCT 40.2 35.3* 35.0* 33.2*  MCV 101.0* 98.9 97.0 98.5  PLT 133* 129* 152 160   Basic Metabolic Panel: Recent Labs  Lab 09/10/23 1606 09/10/23 1801 09/11/23 0330 09/11/23 1035 09/11/23 1754 09/12/23 0500 09/13/23 0251 09/14/23 0313 09/15/23 0328  NA 135  --  132*   < > 132* 131* 135 137 138  K 6.8*   < > 6.0*   < > 5.0 4.4 4.0 3.9 3.5  CL 95*  --  95*   < > 90* 90* 91* 96* 99  CO2 25  --  24   < > 26 26 27 29 29   GLUCOSE 173*  --  90   < > 174* 142* 127* 123* 138*  BUN 93*  --  99*   < > 96* 104* 108* 97* 87*  CREATININE 5.46*  --  4.95*   < > 5.27* 5.17* 5.17* 4.10* 3.09*  CALCIUM 8.9  --  8.7*   < > 9.1 8.8* 8.7* 8.9 9.1  MG 2.8*  --  2.3  --   --  2.3 2.2  --   --   PHOS 6.5*  --  5.9*  --   --  7.0* 5.8*  --   --    < > = values in this interval not displayed.   GFR: Estimated Creatinine Clearance: 19.7 mL/min (A) (by C-G formula based on SCr of 3.09 mg/dL (H)). Liver Function Tests: Recent Labs  Lab 09/10/23 1606 09/11/23 0330  AST 28 23  ALT 10 9  ALKPHOS 141* 119  BILITOT 3.1* 2.6*  PROT 7.9 7.1  ALBUMIN 2.8* 2.8*   No results for input(s): "LIPASE", "AMYLASE" in the last 168 hours. Recent Labs  Lab 09/10/23 2138  AMMONIA 42*   Coagulation Profile: Recent Labs  Lab 09/11/23 0330 09/12/23 0500 09/13/23 0251 09/14/23 0313 09/15/23 0328  INR 3.0* 2.3* 1.7* 1.5* 1.4*   Cardiac Enzymes: Recent Labs  Lab 09/10/23 1606  CKTOTAL 52   BNP (last 3 results) No results for input(s): "PROBNP" in the last 8760 hours. HbA1C: No results for input(s): "HGBA1C" in the last 72 hours.  CBG: Recent Labs  Lab 09/14/23 1202 09/14/23 1640  09/14/23 2101 09/15/23 0802 09/15/23 1154  GLUCAP 133* 140* 129* 119* 151*   Lipid Profile: No results for input(s): "CHOL", "HDL", "LDLCALC", "TRIG", "CHOLHDL", "LDLDIRECT" in the last 72 hours.  Thyroid Function Tests: No results for input(s): "TSH", "T4TOTAL", "FREET4", "T3FREE", "THYROIDAB" in the last 72 hours.  Anemia Panel: No results for input(s): "VITAMINB12", "FOLATE", "FERRITIN", "TIBC", "IRON", "RETICCTPCT" in the last 72 hours. Most Recent Urinalysis On File:     Component Value Date/Time   COLORURINE YELLOW (A) 09/11/2023 0409   APPEARANCEUR HAZY (A) 09/11/2023 0409   LABSPEC 1.015 09/11/2023 0409   PHURINE 5.0 09/11/2023 0409   GLUCOSEU 50 (A) 09/11/2023 0409   HGBUR MODERATE (A) 09/11/2023 0409   BILIRUBINUR NEGATIVE 09/11/2023 0409   BILIRUBINUR negative 03/31/2018 1452   KETONESUR NEGATIVE 09/11/2023 0409   PROTEINUR NEGATIVE 09/11/2023 0409   UROBILINOGEN 0.2 03/31/2018 1452   NITRITE NEGATIVE 09/11/2023 0409   LEUKOCYTESUR NEGATIVE 09/11/2023 0409   Sepsis Labs: @LABRCNTIP (procalcitonin:4,lacticidven:4) Microbiology: Recent Results (from the past 240 hour(s))  Blood culture (routine single)     Status: None   Collection Time: 09/10/23  4:06 PM   Specimen: BLOOD  Result Value Ref Range Status   Specimen Description   Final    BLOOD LEFT ANTECUBITAL Performed at [facility], [address], [place], [place] [address]    Special Requests   Final    BOTTLES DRAWN AEROBIC AND ANAEROBIC Blood Culture adequate volume Performed at [facility], [address] [place], [place] [address]    Culture   Final    NO GROWTH 5 DAYS Performed at [facility], [address], [place], [place] [address]    Report Status 09/15/2023 FINAL  Final  Culture, blood (single)     Status: None   Collection Time: 09/10/23  5:02 PM   Specimen: BLOOD  Result Value Ref Range Status   Specimen Description BLOOD BLOOD RIGHT FOREARM  Final    Special Requests   Final    BOTTLES DRAWN AEROBIC AND ANAEROBIC Blood Culture results may not be optimal due to an inadequate volume of blood received in culture bottles   Culture   Final    NO GROWTH 5 DAYS Performed at [facility], [address], [place], [place] [address]    Report Status 09/15/2023 FINAL  Final  SARS Coronavirus 2 by RT PCR (hospital order, performed in [facility] hospital lab) *cepheid single result test* Anterior Nasal Swab     Status: None   Collection Time: 09/10/23  9:38 PM   Specimen: Anterior Nasal Swab  Result Value Ref Range Status   SARS Coronavirus 2 by RT PCR NEGATIVE NEGATIVE Final    Comment: (NOTE) SARS-CoV-2 target nucleic acids are NOT DETECTED.  The SARS-CoV-2 RNA is generally detectable in upper and lower respiratory specimens  during the acute phase of infection. The lowest concentration of SARS-CoV-2 viral copies this assay can detect is 250 copies / mL. A negative result does not preclude SARS-CoV-2 infection and should not be used as the sole basis for treatment or other patient management decisions.  A negative result may occur with improper specimen collection / handling, submission of specimen other than nasopharyngeal swab, presence of viral mutation(s) within the areas targeted by this assay, and inadequate number of viral copies (<250 copies / mL). A negative result must be combined with clinical observations, patient history, and epidemiological information.  Fact Sheet for Patients:   RoadLapTop.co.za  Fact Sheet for Healthcare Providers: http://kim-miller.com/  This test is not yet approved or  cleared by the [place] FDA and has been authorized for detection and/or diagnosis of SARS-CoV-2 by FDA under an Emergency Use Authorization (EUA).  This EUA will remain in effect (meaning this test can be used) for the duration of the COVID-19 declaration under Section  564(b)(1) of the Act, 21 U.S.C. section 360bbb-3(b)(1), unless the authorization is terminated or revoked sooner.  Performed at [facility], [address] [place], [place] [address]   Respiratory (~20 pathogens) panel by PCR     Status: None   Collection Time: 09/10/23  9:38 PM   Specimen: Nasopharyngeal Swab; Respiratory  Result Value Ref Range Status   Adenovirus NOT DETECTED NOT DETECTED Final   Coronavirus 229E NOT DETECTED NOT DETECTED Final    Comment: (NOTE) The Coronavirus on the Respiratory Panel, DOES NOT test for the novel  Coronavirus (2019 nCoV)    Coronavirus HKU1 NOT DETECTED NOT DETECTED Final   Coronavirus NL63 NOT DETECTED NOT DETECTED Final   Coronavirus OC43 NOT DETECTED NOT DETECTED Final   Metapneumovirus NOT DETECTED NOT DETECTED Final   Rhinovirus / Enterovirus NOT DETECTED NOT DETECTED Final   Influenza A NOT DETECTED NOT DETECTED Final   Influenza B NOT DETECTED NOT DETECTED Final   Parainfluenza Virus 1 NOT DETECTED NOT DETECTED Final   Parainfluenza Virus 2 NOT DETECTED NOT DETECTED Final   Parainfluenza Virus 3 NOT DETECTED NOT DETECTED Final   Parainfluenza Virus 4 NOT DETECTED NOT DETECTED Final   Respiratory Syncytial Virus NOT DETECTED NOT DETECTED Final   Bordetella pertussis NOT DETECTED NOT DETECTED Final   Bordetella Parapertussis NOT DETECTED NOT DETECTED Final   Chlamydophila pneumoniae NOT DETECTED NOT DETECTED Final   Mycoplasma pneumoniae NOT DETECTED NOT DETECTED Final    Comment: Performed at [facility], [address], [place], [place] [address]  MRSA Next Gen by PCR, Nasal     Status: Abnormal   Collection Time: 09/10/23 11:04 PM   Specimen: Nasal Mucosa; Nasal Swab  Result Value Ref Range Status   MRSA by PCR Next Gen DETECTED (A) NOT DETECTED Final    Comment: RESULT CALLED TO, READ BACK BY AND VERIFIED WITH: MARIA CONZALES@[id number]  09/11/23 RH (NOTE) The GeneXpert MRSA Assay (FDA approved for NASAL specimens  only), is one component of a comprehensive MRSA colonization surveillance program. It is not intended to diagnose MRSA infection nor to guide or monitor treatment for MRSA infections. Test performance is not FDA approved in patients less than 64 years old. Performed at [facility], [address], [place], [place] [address]       Radiology Studies last 3 days: US RENAL  Result Date: 09/11/2023 CLINICAL DATA:  Acute on chronic renal failure EXAM: RENAL / URINARY TRACT ULTRASOUND COMPLETE COMPARISON:  07/29/2023.  CT 09/10/2023 FINDINGS: Right Kidney: Renal measurements: 10.3 x 6.2 x 5.8 cm = volume: 192 mL. Echogenicity within normal limits. No mass or hydronephrosis visualized. Left Kidney: Renal measurements: 7.0 x 3.5 x 2.8 cm = volume: 37 mL. Atrophic with cortical thinning. No mass or hydronephrosis. Bladder: Decompressed with Foley catheter in place. Other: Moderate  ascites in the abdomen and pelvis. IMPRESSION: No acute findings. Atrophic left kidney, stable. Moderate ascites. Electronically Signed   By: Charlett Nose M.D.   On: 09/11/2023 19:18       Time spent: 50 min     Sunnie Nielsen, DO [facility] 09/15/2023, 2:01 PM    Dictation software may have been used to generate the above note. Typos may occur and escape review in typed/dictated notes. Please contact Dr Lyn Hollingshead directly for clarity if needed.  Staff may message me via secure chat in Epic  but this may not receive an immediate response,  please page me for urgent matters!  If 7PM-7AM, please contact night coverage [email or web address]

## 2023-09-15 NOTE — Plan of Care (Signed)
  Problem: Health Behavior/Discharge Planning: Goal: Ability to manage health-related needs will improve Outcome: Progressing   Problem: Metabolic: Goal: Ability to maintain appropriate glucose levels will improve Outcome: Progressing   Problem: Skin Integrity: Goal: Risk for impaired skin integrity will decrease Outcome: Progressing   Problem: Health Behavior/Discharge Planning: Goal: Ability to manage health-related needs will improve Outcome: Progressing

## 2023-09-15 NOTE — Progress Notes (Cosign Needed Addendum)
clubbing, cyanosis. No edema.  Neuro: Alert and oriented X 3. Psych: Answers questions appropriately.   Labs: Basic Metabolic Panel: Recent Labs     09/13/23 0251 09/14/23 0313 09/15/23 0328  NA 135 137 138  K 4.0 3.9 3.5  CL 91* 96* 99  CO2 27 29 29   GLUCOSE 127* 123* 138*  BUN 108* 97* 87*  CREATININE 5.17* 4.10* 3.09*  CALCIUM 8.7* 8.9 9.1  MG 2.2  --   --   PHOS 5.8*  --   --    Liver Function Tests: No results for input(s): "AST", "ALT", "ALKPHOS", "BILITOT", "PROT", "ALBUMIN" in the last 72 hours.  No results for input(s): "LIPASE", "AMYLASE" in the last 72 hours. CBC: Recent Labs    09/15/23 0328  WBC 8.5  HGB 10.4*  HCT 33.2*  MCV 98.5  PLT 160   Cardiac Enzymes: No results for input(s): "CKTOTAL", "CKMB", "CKMBINDEX", "TROPONINIHS" in the last 72 hours.  BNP: No results for input(s): "BNP" in the last 72 hours.  D-Dimer: No results for input(s): "DDIMER" in the last 72 hours. Hemoglobin A1C: No results for input(s): "HGBA1C" in the last 72 hours.  Fasting Lipid Panel: No results for input(s): "CHOL", "HDL", "LDLCALC", "TRIG", "CHOLHDL", "LDLDIRECT" in the last 72 hours.  Thyroid Function Tests: No results for input(s): "TSH", "T4TOTAL", "T3FREE", "THYROIDAB" in the last 72 hours.  Invalid input(s): "FREET3"  Anemia Panel: No results for input(s): "VITAMINB12", "FOLATE", "FERRITIN", "TIBC", "IRON", "RETICCTPCT" in the last 72 hours.   Radiology: US RENAL  Result Date: 09/11/2023 CLINICAL DATA:  Acute on chronic renal failure EXAM: RENAL / URINARY TRACT ULTRASOUND COMPLETE COMPARISON:  07/29/2023.  CT 09/10/2023 FINDINGS: Right Kidney: Renal measurements: 10.3 x 6.2 x 5.8 cm = volume: 192 mL. Echogenicity within normal limits. No mass or hydronephrosis visualized. Left Kidney: Renal measurements: 7.0 x 3.5 x 2.8 cm = volume: 37 mL. Atrophic with cortical thinning. No mass or hydronephrosis. Bladder: Decompressed with Foley catheter in place. Other: Moderate  ascites in the abdomen and pelvis. IMPRESSION: No acute findings. Atrophic left kidney, stable. Moderate ascites. Electronically Signed   By:  Charlett Nose M.D.   On: 09/11/2023 19:18   ECHOCARDIOGRAM COMPLETE  Result Date: 09/11/2023    ECHOCARDIOGRAM REPORT   Patient Name:   Taylor [name] Date of Exam: 09/11/2023 Medical Rec #:  [id number]    Height:       70.0 in Accession #:    [id number]   Weight:       158.3 lb Date of Birth:  Aug 07, 1941   BSA:          1.890 m Patient Age:    81 years     BP:           103/69 mmHg Patient Gender: M            HR:           62 bpm. Exam Location:  [facility] Procedure: 2D Echo, 3D Echo, Cardiac Doppler and Color Doppler Indications:     Syncope  History:         Patient has prior history of Echocardiogram examinations, most                  recent 10/30/2022. CHF and Cardiomyopathy, CAD and Previous                  Myocardial Infarction, Pacemaker and Defibrillator, Stroke,  [facility]INIC CARDIOLOGY Progress NOTE       Patient ID: Taylor [name] MRN: [id number] DOB/AGE: 24-Mar-1941 82 y.o.  Admit date: 09/10/2023 Referring Physician Zada Girt, NP Primary Physician Malva Limes, MD  Primary Cardiologist Dr. Marcina Millard Reason for Consultation Chronic Heart Failure, EF < 20%  HPI: Taylor [name] is a 82 y.o. male  with a past medical history of paroxysmal atrial fibrillation (Eliquis), chronic HFrEF (EF < 20%), s/p CABG (08/2006), s/p dual-chamber ICD (2014), SSS, HTN, HLD, type 2 diabetes, pancreatitis, ischemic cardiomyopathy, ventricular fibrillation arrest (1994) s/p MI infarction, bilateral carotid artery stenosis s/p left carotid stent (06/2022), abdominal aortic aneurysm s/p endovascular repair (2008), CKD stage 4, COPD (baseline 2L [place]) liver cirrhosis with ascites (CT 08/2023), who presented to the ED on 09/10/2023 for frequent falls and generalized weakness. Cardiology was consulted for further evaluation.   Interval History: -Patient resting comfortably this AM, denies any chest pain. Remains on nitroglycerin gtt.  -BP and HR controlled. Remains on midodrine. Appears euvolemic. -Cr improved this AM, still elevated above baseline.    Review of systems complete and found to be negative unless listed above    Past Medical History:  Diagnosis Date   AAA (abdominal aortic aneurysm) (HCC) 06/03/2007   [facility]; Dr. Hart Rochester   AICD (automatic cardioverter/defibrillator) present    Arrhythmia    atrial fibrillation   Barrett's esophagus    Bladder cancer (HCC)    Bradycardia    CAD (coronary artery disease)    CAP (community acquired pneumonia) 11/13/2019   CHF (congestive heart failure) (HCC)    Cluster headache    COVID-19 09/27/2021   DDD (degenerative disc disease), lumbar    Diabetes mellitus without complication (HCC)    Dyspnea    WITH EXERTION   Edema    LEFT ANKLE   Fracture of skull base (HCC) 1997    due to fall   GERD (gastroesophageal reflux disease)    Gout    History of bladder cancer 12/1995   Hyperlipidemia    Hypertension    Hypocalcemia 04/19/2020   Possibly secondary to diuretics.   Low=5.9 04/19/2020   Malignant melanoma (HCC) 12/2012   right dorsal forearm excised   Myocardial infarction [facility])    LAST 2014   Osteoarthritis of knee    Other specified complication of vascular prosthetic devices, implants and grafts, initial encounter (HCC) 08/22/2021   Pacemaker 10/10/2006   Pancreatitis 11/22/2019   Pneumonia    2016   Pre-diabetes    Psoriasis    Rib fracture 1997   due to fall   Sleep apnea    CPAP   Stroke [facility])    Venous incompetence     Past Surgical History:  Procedure Laterality Date   ABDOMINAL AORTIC ANEURYSM REPAIR  06/03/2007   [facility]; Dr. Hart Rochester   ANGIOPLASTY  1994   [place]   BLADDER TUMOR EXCISION  12/1995   CAROTID PTA/STENT INTERVENTION Left 07/23/2022   Procedure: CAROTID PTA/STENT INTERVENTION;  Surgeon: Renford Dills, MD;  Location: [facility] INVASIVE CV LAB;  Service: Cardiovascular;  Laterality: Left;   CATARACT EXTRACTION W/PHACO Left 10/22/2016   Procedure: CATARACT EXTRACTION PHACO AND INTRAOCULAR LENS PLACEMENT (IOC);  Surgeon: Galen Manila, MD;  Location: [facility] ORS;  Service: Ophthalmology;  Laterality: Left;  [place] 47.7 AP% 18.4 CDE 8.78 Fluid pack lot # [id number]   CATARACT EXTRACTION W/PHACO Right 12/10/2016   Procedure: CATARACT EXTRACTION PHACO AND INTRAOCULAR LENS PLACEMENT (  [facility] CLINIC CARDIOLOGY Progress NOTE       Patient ID: Taylor [name] MRN: [id number] DOB/AGE: 24-Mar-1941 82 y.o.  Admit date: 09/10/2023 Referring Physician Zada Girt, NP Primary Physician Malva Limes, MD  Primary Cardiologist Dr. Marcina Millard Reason for Consultation Chronic Heart Failure, EF < 20%  HPI: Taylor [name] is a 82 y.o. male  with a past medical history of paroxysmal atrial fibrillation (Eliquis), chronic HFrEF (EF < 20%), s/p CABG (08/2006), s/p dual-chamber ICD (2014), SSS, HTN, HLD, type 2 diabetes, pancreatitis, ischemic cardiomyopathy, ventricular fibrillation arrest (1994) s/p MI infarction, bilateral carotid artery stenosis s/p left carotid stent (06/2022), abdominal aortic aneurysm s/p endovascular repair (2008), CKD stage 4, COPD (baseline 2L [place]) liver cirrhosis with ascites (CT 08/2023), who presented to the ED on 09/10/2023 for frequent falls and generalized weakness. Cardiology was consulted for further evaluation.   Interval History: -Patient resting comfortably this AM, denies any chest pain. Remains on nitroglycerin gtt.  -BP and HR controlled. Remains on midodrine. Appears euvolemic. -Cr improved this AM, still elevated above baseline.    Review of systems complete and found to be negative unless listed above    Past Medical History:  Diagnosis Date   AAA (abdominal aortic aneurysm) (HCC) 06/03/2007   [facility]; Dr. Hart Rochester   AICD (automatic cardioverter/defibrillator) present    Arrhythmia    atrial fibrillation   Barrett's esophagus    Bladder cancer (HCC)    Bradycardia    CAD (coronary artery disease)    CAP (community acquired pneumonia) 11/13/2019   CHF (congestive heart failure) (HCC)    Cluster headache    COVID-19 09/27/2021   DDD (degenerative disc disease), lumbar    Diabetes mellitus without complication (HCC)    Dyspnea    WITH EXERTION   Edema    LEFT ANKLE   Fracture of skull base (HCC) 1997    due to fall   GERD (gastroesophageal reflux disease)    Gout    History of bladder cancer 12/1995   Hyperlipidemia    Hypertension    Hypocalcemia 04/19/2020   Possibly secondary to diuretics.   Low=5.9 04/19/2020   Malignant melanoma (HCC) 12/2012   right dorsal forearm excised   Myocardial infarction [facility])    LAST 2014   Osteoarthritis of knee    Other specified complication of vascular prosthetic devices, implants and grafts, initial encounter (HCC) 08/22/2021   Pacemaker 10/10/2006   Pancreatitis 11/22/2019   Pneumonia    2016   Pre-diabetes    Psoriasis    Rib fracture 1997   due to fall   Sleep apnea    CPAP   Stroke [facility])    Venous incompetence     Past Surgical History:  Procedure Laterality Date   ABDOMINAL AORTIC ANEURYSM REPAIR  06/03/2007   [facility]; Dr. Hart Rochester   ANGIOPLASTY  1994   [place]   BLADDER TUMOR EXCISION  12/1995   CAROTID PTA/STENT INTERVENTION Left 07/23/2022   Procedure: CAROTID PTA/STENT INTERVENTION;  Surgeon: Renford Dills, MD;  Location: [facility] INVASIVE CV LAB;  Service: Cardiovascular;  Laterality: Left;   CATARACT EXTRACTION W/PHACO Left 10/22/2016   Procedure: CATARACT EXTRACTION PHACO AND INTRAOCULAR LENS PLACEMENT (IOC);  Surgeon: Galen Manila, MD;  Location: [facility] ORS;  Service: Ophthalmology;  Laterality: Left;  [place] 47.7 AP% 18.4 CDE 8.78 Fluid pack lot # [id number]   CATARACT EXTRACTION W/PHACO Right 12/10/2016   Procedure: CATARACT EXTRACTION PHACO AND INTRAOCULAR LENS PLACEMENT (  [facility]INIC CARDIOLOGY Progress NOTE       Patient ID: Taylor [name] MRN: [id number] DOB/AGE: 24-Mar-1941 82 y.o.  Admit date: 09/10/2023 Referring Physician Zada Girt, NP Primary Physician Malva Limes, MD  Primary Cardiologist Dr. Marcina Millard Reason for Consultation Chronic Heart Failure, EF < 20%  HPI: Taylor [name] is a 82 y.o. male  with a past medical history of paroxysmal atrial fibrillation (Eliquis), chronic HFrEF (EF < 20%), s/p CABG (08/2006), s/p dual-chamber ICD (2014), SSS, HTN, HLD, type 2 diabetes, pancreatitis, ischemic cardiomyopathy, ventricular fibrillation arrest (1994) s/p MI infarction, bilateral carotid artery stenosis s/p left carotid stent (06/2022), abdominal aortic aneurysm s/p endovascular repair (2008), CKD stage 4, COPD (baseline 2L [place]) liver cirrhosis with ascites (CT 08/2023), who presented to the ED on 09/10/2023 for frequent falls and generalized weakness. Cardiology was consulted for further evaluation.   Interval History: -Patient resting comfortably this AM, denies any chest pain. Remains on nitroglycerin gtt.  -BP and HR controlled. Remains on midodrine. Appears euvolemic. -Cr improved this AM, still elevated above baseline.    Review of systems complete and found to be negative unless listed above    Past Medical History:  Diagnosis Date   AAA (abdominal aortic aneurysm) (HCC) 06/03/2007   [facility]; Dr. Hart Rochester   AICD (automatic cardioverter/defibrillator) present    Arrhythmia    atrial fibrillation   Barrett's esophagus    Bladder cancer (HCC)    Bradycardia    CAD (coronary artery disease)    CAP (community acquired pneumonia) 11/13/2019   CHF (congestive heart failure) (HCC)    Cluster headache    COVID-19 09/27/2021   DDD (degenerative disc disease), lumbar    Diabetes mellitus without complication (HCC)    Dyspnea    WITH EXERTION   Edema    LEFT ANKLE   Fracture of skull base (HCC) 1997    due to fall   GERD (gastroesophageal reflux disease)    Gout    History of bladder cancer 12/1995   Hyperlipidemia    Hypertension    Hypocalcemia 04/19/2020   Possibly secondary to diuretics.   Low=5.9 04/19/2020   Malignant melanoma (HCC) 12/2012   right dorsal forearm excised   Myocardial infarction [facility])    LAST 2014   Osteoarthritis of knee    Other specified complication of vascular prosthetic devices, implants and grafts, initial encounter (HCC) 08/22/2021   Pacemaker 10/10/2006   Pancreatitis 11/22/2019   Pneumonia    2016   Pre-diabetes    Psoriasis    Rib fracture 1997   due to fall   Sleep apnea    CPAP   Stroke [facility])    Venous incompetence     Past Surgical History:  Procedure Laterality Date   ABDOMINAL AORTIC ANEURYSM REPAIR  06/03/2007   [facility]; Dr. Hart Rochester   ANGIOPLASTY  1994   [place]   BLADDER TUMOR EXCISION  12/1995   CAROTID PTA/STENT INTERVENTION Left 07/23/2022   Procedure: CAROTID PTA/STENT INTERVENTION;  Surgeon: Renford Dills, MD;  Location: [facility] INVASIVE CV LAB;  Service: Cardiovascular;  Laterality: Left;   CATARACT EXTRACTION W/PHACO Left 10/22/2016   Procedure: CATARACT EXTRACTION PHACO AND INTRAOCULAR LENS PLACEMENT (IOC);  Surgeon: Galen Manila, MD;  Location: [facility] ORS;  Service: Ophthalmology;  Laterality: Left;  [place] 47.7 AP% 18.4 CDE 8.78 Fluid pack lot # [id number]   CATARACT EXTRACTION W/PHACO Right 12/10/2016   Procedure: CATARACT EXTRACTION PHACO AND INTRAOCULAR LENS PLACEMENT (  [facility] CLINIC CARDIOLOGY Progress NOTE       Patient ID: Taylor [name] MRN: [id number] DOB/AGE: 24-Mar-1941 82 y.o.  Admit date: 09/10/2023 Referring Physician Zada Girt, NP Primary Physician Malva Limes, MD  Primary Cardiologist Dr. Marcina Millard Reason for Consultation Chronic Heart Failure, EF < 20%  HPI: Taylor [name] is a 82 y.o. male  with a past medical history of paroxysmal atrial fibrillation (Eliquis), chronic HFrEF (EF < 20%), s/p CABG (08/2006), s/p dual-chamber ICD (2014), SSS, HTN, HLD, type 2 diabetes, pancreatitis, ischemic cardiomyopathy, ventricular fibrillation arrest (1994) s/p MI infarction, bilateral carotid artery stenosis s/p left carotid stent (06/2022), abdominal aortic aneurysm s/p endovascular repair (2008), CKD stage 4, COPD (baseline 2L [place]) liver cirrhosis with ascites (CT 08/2023), who presented to the ED on 09/10/2023 for frequent falls and generalized weakness. Cardiology was consulted for further evaluation.   Interval History: -Patient resting comfortably this AM, denies any chest pain. Remains on nitroglycerin gtt.  -BP and HR controlled. Remains on midodrine. Appears euvolemic. -Cr improved this AM, still elevated above baseline.    Review of systems complete and found to be negative unless listed above    Past Medical History:  Diagnosis Date   AAA (abdominal aortic aneurysm) (HCC) 06/03/2007   [facility]; Dr. Hart Rochester   AICD (automatic cardioverter/defibrillator) present    Arrhythmia    atrial fibrillation   Barrett's esophagus    Bladder cancer (HCC)    Bradycardia    CAD (coronary artery disease)    CAP (community acquired pneumonia) 11/13/2019   CHF (congestive heart failure) (HCC)    Cluster headache    COVID-19 09/27/2021   DDD (degenerative disc disease), lumbar    Diabetes mellitus without complication (HCC)    Dyspnea    WITH EXERTION   Edema    LEFT ANKLE   Fracture of skull base (HCC) 1997    due to fall   GERD (gastroesophageal reflux disease)    Gout    History of bladder cancer 12/1995   Hyperlipidemia    Hypertension    Hypocalcemia 04/19/2020   Possibly secondary to diuretics.   Low=5.9 04/19/2020   Malignant melanoma (HCC) 12/2012   right dorsal forearm excised   Myocardial infarction [facility])    LAST 2014   Osteoarthritis of knee    Other specified complication of vascular prosthetic devices, implants and grafts, initial encounter (HCC) 08/22/2021   Pacemaker 10/10/2006   Pancreatitis 11/22/2019   Pneumonia    2016   Pre-diabetes    Psoriasis    Rib fracture 1997   due to fall   Sleep apnea    CPAP   Stroke [facility])    Venous incompetence     Past Surgical History:  Procedure Laterality Date   ABDOMINAL AORTIC ANEURYSM REPAIR  06/03/2007   [facility]; Dr. Hart Rochester   ANGIOPLASTY  1994   [place]   BLADDER TUMOR EXCISION  12/1995   CAROTID PTA/STENT INTERVENTION Left 07/23/2022   Procedure: CAROTID PTA/STENT INTERVENTION;  Surgeon: Renford Dills, MD;  Location: [facility] INVASIVE CV LAB;  Service: Cardiovascular;  Laterality: Left;   CATARACT EXTRACTION W/PHACO Left 10/22/2016   Procedure: CATARACT EXTRACTION PHACO AND INTRAOCULAR LENS PLACEMENT (IOC);  Surgeon: Galen Manila, MD;  Location: [facility] ORS;  Service: Ophthalmology;  Laterality: Left;  [place] 47.7 AP% 18.4 CDE 8.78 Fluid pack lot # [id number]   CATARACT EXTRACTION W/PHACO Right 12/10/2016   Procedure: CATARACT EXTRACTION PHACO AND INTRAOCULAR LENS PLACEMENT (  clubbing, cyanosis. No edema.  Neuro: Alert and oriented X 3. Psych: Answers questions appropriately.   Labs: Basic Metabolic Panel: Recent Labs     09/13/23 0251 09/14/23 0313 09/15/23 0328  NA 135 137 138  K 4.0 3.9 3.5  CL 91* 96* 99  CO2 27 29 29   GLUCOSE 127* 123* 138*  BUN 108* 97* 87*  CREATININE 5.17* 4.10* 3.09*  CALCIUM 8.7* 8.9 9.1  MG 2.2  --   --   PHOS 5.8*  --   --    Liver Function Tests: No results for input(s): "AST", "ALT", "ALKPHOS", "BILITOT", "PROT", "ALBUMIN" in the last 72 hours.  No results for input(s): "LIPASE", "AMYLASE" in the last 72 hours. CBC: Recent Labs    09/15/23 0328  WBC 8.5  HGB 10.4*  HCT 33.2*  MCV 98.5  PLT 160   Cardiac Enzymes: No results for input(s): "CKTOTAL", "CKMB", "CKMBINDEX", "TROPONINIHS" in the last 72 hours.  BNP: No results for input(s): "BNP" in the last 72 hours.  D-Dimer: No results for input(s): "DDIMER" in the last 72 hours. Hemoglobin A1C: No results for input(s): "HGBA1C" in the last 72 hours.  Fasting Lipid Panel: No results for input(s): "CHOL", "HDL", "LDLCALC", "TRIG", "CHOLHDL", "LDLDIRECT" in the last 72 hours.  Thyroid Function Tests: No results for input(s): "TSH", "T4TOTAL", "T3FREE", "THYROIDAB" in the last 72 hours.  Invalid input(s): "FREET3"  Anemia Panel: No results for input(s): "VITAMINB12", "FOLATE", "FERRITIN", "TIBC", "IRON", "RETICCTPCT" in the last 72 hours.   Radiology: US RENAL  Result Date: 09/11/2023 CLINICAL DATA:  Acute on chronic renal failure EXAM: RENAL / URINARY TRACT ULTRASOUND COMPLETE COMPARISON:  07/29/2023.  CT 09/10/2023 FINDINGS: Right Kidney: Renal measurements: 10.3 x 6.2 x 5.8 cm = volume: 192 mL. Echogenicity within normal limits. No mass or hydronephrosis visualized. Left Kidney: Renal measurements: 7.0 x 3.5 x 2.8 cm = volume: 37 mL. Atrophic with cortical thinning. No mass or hydronephrosis. Bladder: Decompressed with Foley catheter in place. Other: Moderate  ascites in the abdomen and pelvis. IMPRESSION: No acute findings. Atrophic left kidney, stable. Moderate ascites. Electronically Signed   By:  Charlett Nose M.D.   On: 09/11/2023 19:18   ECHOCARDIOGRAM COMPLETE  Result Date: 09/11/2023    ECHOCARDIOGRAM REPORT   Patient Name:   Taylor [name] Date of Exam: 09/11/2023 Medical Rec #:  [id number]    Height:       70.0 in Accession #:    [id number]   Weight:       158.3 lb Date of Birth:  Aug 07, 1941   BSA:          1.890 m Patient Age:    81 years     BP:           103/69 mmHg Patient Gender: M            HR:           62 bpm. Exam Location:  [facility] Procedure: 2D Echo, 3D Echo, Cardiac Doppler and Color Doppler Indications:     Syncope  History:         Patient has prior history of Echocardiogram examinations, most                  recent 10/30/2022. CHF and Cardiomyopathy, CAD and Previous                  Myocardial Infarction, Pacemaker and Defibrillator, Stroke,  clubbing, cyanosis. No edema.  Neuro: Alert and oriented X 3. Psych: Answers questions appropriately.   Labs: Basic Metabolic Panel: Recent Labs     09/13/23 0251 09/14/23 0313 09/15/23 0328  NA 135 137 138  K 4.0 3.9 3.5  CL 91* 96* 99  CO2 27 29 29   GLUCOSE 127* 123* 138*  BUN 108* 97* 87*  CREATININE 5.17* 4.10* 3.09*  CALCIUM 8.7* 8.9 9.1  MG 2.2  --   --   PHOS 5.8*  --   --    Liver Function Tests: No results for input(s): "AST", "ALT", "ALKPHOS", "BILITOT", "PROT", "ALBUMIN" in the last 72 hours.  No results for input(s): "LIPASE", "AMYLASE" in the last 72 hours. CBC: Recent Labs    09/15/23 0328  WBC 8.5  HGB 10.4*  HCT 33.2*  MCV 98.5  PLT 160   Cardiac Enzymes: No results for input(s): "CKTOTAL", "CKMB", "CKMBINDEX", "TROPONINIHS" in the last 72 hours.  BNP: No results for input(s): "BNP" in the last 72 hours.  D-Dimer: No results for input(s): "DDIMER" in the last 72 hours. Hemoglobin A1C: No results for input(s): "HGBA1C" in the last 72 hours.  Fasting Lipid Panel: No results for input(s): "CHOL", "HDL", "LDLCALC", "TRIG", "CHOLHDL", "LDLDIRECT" in the last 72 hours.  Thyroid Function Tests: No results for input(s): "TSH", "T4TOTAL", "T3FREE", "THYROIDAB" in the last 72 hours.  Invalid input(s): "FREET3"  Anemia Panel: No results for input(s): "VITAMINB12", "FOLATE", "FERRITIN", "TIBC", "IRON", "RETICCTPCT" in the last 72 hours.   Radiology: US RENAL  Result Date: 09/11/2023 CLINICAL DATA:  Acute on chronic renal failure EXAM: RENAL / URINARY TRACT ULTRASOUND COMPLETE COMPARISON:  07/29/2023.  CT 09/10/2023 FINDINGS: Right Kidney: Renal measurements: 10.3 x 6.2 x 5.8 cm = volume: 192 mL. Echogenicity within normal limits. No mass or hydronephrosis visualized. Left Kidney: Renal measurements: 7.0 x 3.5 x 2.8 cm = volume: 37 mL. Atrophic with cortical thinning. No mass or hydronephrosis. Bladder: Decompressed with Foley catheter in place. Other: Moderate  ascites in the abdomen and pelvis. IMPRESSION: No acute findings. Atrophic left kidney, stable. Moderate ascites. Electronically Signed   By:  Charlett Nose M.D.   On: 09/11/2023 19:18   ECHOCARDIOGRAM COMPLETE  Result Date: 09/11/2023    ECHOCARDIOGRAM REPORT   Patient Name:   Taylor [name] Date of Exam: 09/11/2023 Medical Rec #:  [id number]    Height:       70.0 in Accession #:    [id number]   Weight:       158.3 lb Date of Birth:  Aug 07, 1941   BSA:          1.890 m Patient Age:    81 years     BP:           103/69 mmHg Patient Gender: M            HR:           62 bpm. Exam Location:  [facility] Procedure: 2D Echo, 3D Echo, Cardiac Doppler and Color Doppler Indications:     Syncope  History:         Patient has prior history of Echocardiogram examinations, most                  recent 10/30/2022. CHF and Cardiomyopathy, CAD and Previous                  Myocardial Infarction, Pacemaker and Defibrillator, Stroke,  clubbing, cyanosis. No edema.  Neuro: Alert and oriented X 3. Psych: Answers questions appropriately.   Labs: Basic Metabolic Panel: Recent Labs     09/13/23 0251 09/14/23 0313 09/15/23 0328  NA 135 137 138  K 4.0 3.9 3.5  CL 91* 96* 99  CO2 27 29 29   GLUCOSE 127* 123* 138*  BUN 108* 97* 87*  CREATININE 5.17* 4.10* 3.09*  CALCIUM 8.7* 8.9 9.1  MG 2.2  --   --   PHOS 5.8*  --   --    Liver Function Tests: No results for input(s): "AST", "ALT", "ALKPHOS", "BILITOT", "PROT", "ALBUMIN" in the last 72 hours.  No results for input(s): "LIPASE", "AMYLASE" in the last 72 hours. CBC: Recent Labs    09/15/23 0328  WBC 8.5  HGB 10.4*  HCT 33.2*  MCV 98.5  PLT 160   Cardiac Enzymes: No results for input(s): "CKTOTAL", "CKMB", "CKMBINDEX", "TROPONINIHS" in the last 72 hours.  BNP: No results for input(s): "BNP" in the last 72 hours.  D-Dimer: No results for input(s): "DDIMER" in the last 72 hours. Hemoglobin A1C: No results for input(s): "HGBA1C" in the last 72 hours.  Fasting Lipid Panel: No results for input(s): "CHOL", "HDL", "LDLCALC", "TRIG", "CHOLHDL", "LDLDIRECT" in the last 72 hours.  Thyroid Function Tests: No results for input(s): "TSH", "T4TOTAL", "T3FREE", "THYROIDAB" in the last 72 hours.  Invalid input(s): "FREET3"  Anemia Panel: No results for input(s): "VITAMINB12", "FOLATE", "FERRITIN", "TIBC", "IRON", "RETICCTPCT" in the last 72 hours.   Radiology: US RENAL  Result Date: 09/11/2023 CLINICAL DATA:  Acute on chronic renal failure EXAM: RENAL / URINARY TRACT ULTRASOUND COMPLETE COMPARISON:  07/29/2023.  CT 09/10/2023 FINDINGS: Right Kidney: Renal measurements: 10.3 x 6.2 x 5.8 cm = volume: 192 mL. Echogenicity within normal limits. No mass or hydronephrosis visualized. Left Kidney: Renal measurements: 7.0 x 3.5 x 2.8 cm = volume: 37 mL. Atrophic with cortical thinning. No mass or hydronephrosis. Bladder: Decompressed with Foley catheter in place. Other: Moderate  ascites in the abdomen and pelvis. IMPRESSION: No acute findings. Atrophic left kidney, stable. Moderate ascites. Electronically Signed   By:  Charlett Nose M.D.   On: 09/11/2023 19:18   ECHOCARDIOGRAM COMPLETE  Result Date: 09/11/2023    ECHOCARDIOGRAM REPORT   Patient Name:   Taylor [name] Date of Exam: 09/11/2023 Medical Rec #:  [id number]    Height:       70.0 in Accession #:    [id number]   Weight:       158.3 lb Date of Birth:  Aug 07, 1941   BSA:          1.890 m Patient Age:    81 years     BP:           103/69 mmHg Patient Gender: M            HR:           62 bpm. Exam Location:  [facility] Procedure: 2D Echo, 3D Echo, Cardiac Doppler and Color Doppler Indications:     Syncope  History:         Patient has prior history of Echocardiogram examinations, most                  recent 10/30/2022. CHF and Cardiomyopathy, CAD and Previous                  Myocardial Infarction, Pacemaker and Defibrillator, Stroke,  clubbing, cyanosis. No edema.  Neuro: Alert and oriented X 3. Psych: Answers questions appropriately.   Labs: Basic Metabolic Panel: Recent Labs     09/13/23 0251 09/14/23 0313 09/15/23 0328  NA 135 137 138  K 4.0 3.9 3.5  CL 91* 96* 99  CO2 27 29 29   GLUCOSE 127* 123* 138*  BUN 108* 97* 87*  CREATININE 5.17* 4.10* 3.09*  CALCIUM 8.7* 8.9 9.1  MG 2.2  --   --   PHOS 5.8*  --   --    Liver Function Tests: No results for input(s): "AST", "ALT", "ALKPHOS", "BILITOT", "PROT", "ALBUMIN" in the last 72 hours.  No results for input(s): "LIPASE", "AMYLASE" in the last 72 hours. CBC: Recent Labs    09/15/23 0328  WBC 8.5  HGB 10.4*  HCT 33.2*  MCV 98.5  PLT 160   Cardiac Enzymes: No results for input(s): "CKTOTAL", "CKMB", "CKMBINDEX", "TROPONINIHS" in the last 72 hours.  BNP: No results for input(s): "BNP" in the last 72 hours.  D-Dimer: No results for input(s): "DDIMER" in the last 72 hours. Hemoglobin A1C: No results for input(s): "HGBA1C" in the last 72 hours.  Fasting Lipid Panel: No results for input(s): "CHOL", "HDL", "LDLCALC", "TRIG", "CHOLHDL", "LDLDIRECT" in the last 72 hours.  Thyroid Function Tests: No results for input(s): "TSH", "T4TOTAL", "T3FREE", "THYROIDAB" in the last 72 hours.  Invalid input(s): "FREET3"  Anemia Panel: No results for input(s): "VITAMINB12", "FOLATE", "FERRITIN", "TIBC", "IRON", "RETICCTPCT" in the last 72 hours.   Radiology: US RENAL  Result Date: 09/11/2023 CLINICAL DATA:  Acute on chronic renal failure EXAM: RENAL / URINARY TRACT ULTRASOUND COMPLETE COMPARISON:  07/29/2023.  CT 09/10/2023 FINDINGS: Right Kidney: Renal measurements: 10.3 x 6.2 x 5.8 cm = volume: 192 mL. Echogenicity within normal limits. No mass or hydronephrosis visualized. Left Kidney: Renal measurements: 7.0 x 3.5 x 2.8 cm = volume: 37 mL. Atrophic with cortical thinning. No mass or hydronephrosis. Bladder: Decompressed with Foley catheter in place. Other: Moderate  ascites in the abdomen and pelvis. IMPRESSION: No acute findings. Atrophic left kidney, stable. Moderate ascites. Electronically Signed   By:  Charlett Nose M.D.   On: 09/11/2023 19:18   ECHOCARDIOGRAM COMPLETE  Result Date: 09/11/2023    ECHOCARDIOGRAM REPORT   Patient Name:   Taylor [name] Date of Exam: 09/11/2023 Medical Rec #:  [id number]    Height:       70.0 in Accession #:    [id number]   Weight:       158.3 lb Date of Birth:  Aug 07, 1941   BSA:          1.890 m Patient Age:    81 years     BP:           103/69 mmHg Patient Gender: M            HR:           62 bpm. Exam Location:  [facility] Procedure: 2D Echo, 3D Echo, Cardiac Doppler and Color Doppler Indications:     Syncope  History:         Patient has prior history of Echocardiogram examinations, most                  recent 10/30/2022. CHF and Cardiomyopathy, CAD and Previous                  Myocardial Infarction, Pacemaker and Defibrillator, Stroke,  clubbing, cyanosis. No edema.  Neuro: Alert and oriented X 3. Psych: Answers questions appropriately.   Labs: Basic Metabolic Panel: Recent Labs     09/13/23 0251 09/14/23 0313 09/15/23 0328  NA 135 137 138  K 4.0 3.9 3.5  CL 91* 96* 99  CO2 27 29 29   GLUCOSE 127* 123* 138*  BUN 108* 97* 87*  CREATININE 5.17* 4.10* 3.09*  CALCIUM 8.7* 8.9 9.1  MG 2.2  --   --   PHOS 5.8*  --   --    Liver Function Tests: No results for input(s): "AST", "ALT", "ALKPHOS", "BILITOT", "PROT", "ALBUMIN" in the last 72 hours.  No results for input(s): "LIPASE", "AMYLASE" in the last 72 hours. CBC: Recent Labs    09/15/23 0328  WBC 8.5  HGB 10.4*  HCT 33.2*  MCV 98.5  PLT 160   Cardiac Enzymes: No results for input(s): "CKTOTAL", "CKMB", "CKMBINDEX", "TROPONINIHS" in the last 72 hours.  BNP: No results for input(s): "BNP" in the last 72 hours.  D-Dimer: No results for input(s): "DDIMER" in the last 72 hours. Hemoglobin A1C: No results for input(s): "HGBA1C" in the last 72 hours.  Fasting Lipid Panel: No results for input(s): "CHOL", "HDL", "LDLCALC", "TRIG", "CHOLHDL", "LDLDIRECT" in the last 72 hours.  Thyroid Function Tests: No results for input(s): "TSH", "T4TOTAL", "T3FREE", "THYROIDAB" in the last 72 hours.  Invalid input(s): "FREET3"  Anemia Panel: No results for input(s): "VITAMINB12", "FOLATE", "FERRITIN", "TIBC", "IRON", "RETICCTPCT" in the last 72 hours.   Radiology: US RENAL  Result Date: 09/11/2023 CLINICAL DATA:  Acute on chronic renal failure EXAM: RENAL / URINARY TRACT ULTRASOUND COMPLETE COMPARISON:  07/29/2023.  CT 09/10/2023 FINDINGS: Right Kidney: Renal measurements: 10.3 x 6.2 x 5.8 cm = volume: 192 mL. Echogenicity within normal limits. No mass or hydronephrosis visualized. Left Kidney: Renal measurements: 7.0 x 3.5 x 2.8 cm = volume: 37 mL. Atrophic with cortical thinning. No mass or hydronephrosis. Bladder: Decompressed with Foley catheter in place. Other: Moderate  ascites in the abdomen and pelvis. IMPRESSION: No acute findings. Atrophic left kidney, stable. Moderate ascites. Electronically Signed   By:  Charlett Nose M.D.   On: 09/11/2023 19:18   ECHOCARDIOGRAM COMPLETE  Result Date: 09/11/2023    ECHOCARDIOGRAM REPORT   Patient Name:   Taylor [name] Date of Exam: 09/11/2023 Medical Rec #:  [id number]    Height:       70.0 in Accession #:    [id number]   Weight:       158.3 lb Date of Birth:  Aug 07, 1941   BSA:          1.890 m Patient Age:    81 years     BP:           103/69 mmHg Patient Gender: M            HR:           62 bpm. Exam Location:  [facility] Procedure: 2D Echo, 3D Echo, Cardiac Doppler and Color Doppler Indications:     Syncope  History:         Patient has prior history of Echocardiogram examinations, most                  recent 10/30/2022. CHF and Cardiomyopathy, CAD and Previous                  Myocardial Infarction, Pacemaker and Defibrillator, Stroke,  clubbing, cyanosis. No edema.  Neuro: Alert and oriented X 3. Psych: Answers questions appropriately.   Labs: Basic Metabolic Panel: Recent Labs     09/13/23 0251 09/14/23 0313 09/15/23 0328  NA 135 137 138  K 4.0 3.9 3.5  CL 91* 96* 99  CO2 27 29 29   GLUCOSE 127* 123* 138*  BUN 108* 97* 87*  CREATININE 5.17* 4.10* 3.09*  CALCIUM 8.7* 8.9 9.1  MG 2.2  --   --   PHOS 5.8*  --   --    Liver Function Tests: No results for input(s): "AST", "ALT", "ALKPHOS", "BILITOT", "PROT", "ALBUMIN" in the last 72 hours.  No results for input(s): "LIPASE", "AMYLASE" in the last 72 hours. CBC: Recent Labs    09/15/23 0328  WBC 8.5  HGB 10.4*  HCT 33.2*  MCV 98.5  PLT 160   Cardiac Enzymes: No results for input(s): "CKTOTAL", "CKMB", "CKMBINDEX", "TROPONINIHS" in the last 72 hours.  BNP: No results for input(s): "BNP" in the last 72 hours.  D-Dimer: No results for input(s): "DDIMER" in the last 72 hours. Hemoglobin A1C: No results for input(s): "HGBA1C" in the last 72 hours.  Fasting Lipid Panel: No results for input(s): "CHOL", "HDL", "LDLCALC", "TRIG", "CHOLHDL", "LDLDIRECT" in the last 72 hours.  Thyroid Function Tests: No results for input(s): "TSH", "T4TOTAL", "T3FREE", "THYROIDAB" in the last 72 hours.  Invalid input(s): "FREET3"  Anemia Panel: No results for input(s): "VITAMINB12", "FOLATE", "FERRITIN", "TIBC", "IRON", "RETICCTPCT" in the last 72 hours.   Radiology: US RENAL  Result Date: 09/11/2023 CLINICAL DATA:  Acute on chronic renal failure EXAM: RENAL / URINARY TRACT ULTRASOUND COMPLETE COMPARISON:  07/29/2023.  CT 09/10/2023 FINDINGS: Right Kidney: Renal measurements: 10.3 x 6.2 x 5.8 cm = volume: 192 mL. Echogenicity within normal limits. No mass or hydronephrosis visualized. Left Kidney: Renal measurements: 7.0 x 3.5 x 2.8 cm = volume: 37 mL. Atrophic with cortical thinning. No mass or hydronephrosis. Bladder: Decompressed with Foley catheter in place. Other: Moderate  ascites in the abdomen and pelvis. IMPRESSION: No acute findings. Atrophic left kidney, stable. Moderate ascites. Electronically Signed   By:  Charlett Nose M.D.   On: 09/11/2023 19:18   ECHOCARDIOGRAM COMPLETE  Result Date: 09/11/2023    ECHOCARDIOGRAM REPORT   Patient Name:   Taylor [name] Date of Exam: 09/11/2023 Medical Rec #:  [id number]    Height:       70.0 in Accession #:    [id number]   Weight:       158.3 lb Date of Birth:  Aug 07, 1941   BSA:          1.890 m Patient Age:    81 years     BP:           103/69 mmHg Patient Gender: M            HR:           62 bpm. Exam Location:  [facility] Procedure: 2D Echo, 3D Echo, Cardiac Doppler and Color Doppler Indications:     Syncope  History:         Patient has prior history of Echocardiogram examinations, most                  recent 10/30/2022. CHF and Cardiomyopathy, CAD and Previous                  Myocardial Infarction, Pacemaker and Defibrillator, Stroke,  [facility] CARDIOLOGY Progress NOTE       Patient ID: Taylor [name] MRN: [id number] DOB/AGE: 24-Mar-1941 82 y.o.  Admit date: 09/10/2023 Referring Physician Zada Girt, NP Primary Physician Malva Limes, MD  Primary Cardiologist Dr. Marcina Millard Reason for Consultation Chronic Heart Failure, EF < 20%  HPI: Taylor [name] is a 82 y.o. male  with a past medical history of paroxysmal atrial fibrillation (Eliquis), chronic HFrEF (EF < 20%), s/p CABG (08/2006), s/p dual-chamber ICD (2014), SSS, HTN, HLD, type 2 diabetes, pancreatitis, ischemic cardiomyopathy, ventricular fibrillation arrest (1994) s/p MI infarction, bilateral carotid artery stenosis s/p left carotid stent (06/2022), abdominal aortic aneurysm s/p endovascular repair (2008), CKD stage 4, COPD (baseline 2L [place]) liver cirrhosis with ascites (CT 08/2023), who presented to the ED on 09/10/2023 for frequent falls and generalized weakness. Cardiology was consulted for further evaluation.   Interval History: -Patient resting comfortably this AM, denies any chest pain. Remains on nitroglycerin gtt.  -BP and HR controlled. Remains on midodrine. Appears euvolemic. -Cr improved this AM, still elevated above baseline.    Review of systems complete and found to be negative unless listed above    Past Medical History:  Diagnosis Date   AAA (abdominal aortic aneurysm) (HCC) 06/03/2007   [facility]; Dr. Hart [place]   AICD (automatic cardioverter/defibrillator) present    Arrhythmia    atrial fibrillation   Barrett's esophagus    Bladder cancer (HCC)    Bradycardia    CAD (coronary artery disease)    CAP (community acquired pneumonia) 11/13/2019   CHF (congestive heart failure) (HCC)    Cluster headache    COVID-19 09/27/2021   DDD (degenerative disc disease), lumbar    Diabetes mellitus without complication (HCC)    Dyspnea    WITH EXERTION   Edema    LEFT ANKLE   Fracture of skull base (HCC) 1997    due to fall   GERD (gastroesophageal reflux disease)    Gout    History of bladder cancer 12/1995   Hyperlipidemia    Hypertension    Hypocalcemia 04/19/2020   Possibly secondary to diuretics.   Low=5.9 04/19/2020   Malignant melanoma (HCC) 12/2012   right dorsal forearm excised   Myocardial infarction [facility])    LAST 2014   Osteoarthritis of knee    Other specified complication of vascular prosthetic devices, implants and grafts, initial encounter (HCC) 08/22/2021   Pacemaker 10/10/2006   Pancreatitis 11/22/2019   Pneumonia    2016   Pre-diabetes    Psoriasis    Rib fracture 1997   due to fall   Sleep apnea    CPAP   Stroke [facility])    Venous incompetence     Past Surgical History:  Procedure Laterality Date   ABDOMINAL AORTIC ANEURYSM REPAIR  06/03/2007   [facility]; Dr. Hart [place]   ANGIOPLASTY  1994   [place]   BLADDER TUMOR EXCISION  12/1995   CAROTID PTA/STENT INTERVENTION Left 07/23/2022   Procedure: CAROTID PTA/STENT INTERVENTION;  Surgeon: Renford Dills, MD;  Location: [facility] INVASIVE CV LAB;  Service: Cardiovascular;  Laterality: Left;   CATARACT EXTRACTION W/PHACO Left 10/22/2016   Procedure: CATARACT EXTRACTION PHACO AND INTRAOCULAR LENS PLACEMENT (IOC);  Surgeon: Galen Manila, MD;  Location: [facility] ORS;  Service: Ophthalmology;  Laterality: Left;  [place] 47.7 AP% 18.4 CDE 8.78 Fluid pack lot # [id number]   CATARACT EXTRACTION W/PHACO Right 12/10/2016   Procedure: CATARACT EXTRACTION PHACO AND INTRAOCULAR LENS PLACEMENT (  clubbing, cyanosis. No edema.  Neuro: Alert and oriented X 3. Psych: Answers questions appropriately.   Labs: Basic Metabolic Panel: Recent Labs     09/13/23 0251 09/14/23 0313 09/15/23 0328  NA 135 137 138  K 4.0 3.9 3.5  CL 91* 96* 99  CO2 27 29 29   GLUCOSE 127* 123* 138*  BUN 108* 97* 87*  CREATININE 5.17* 4.10* 3.09*  CALCIUM 8.7* 8.9 9.1  MG 2.2  --   --   PHOS 5.8*  --   --    Liver Function Tests: No results for input(s): "AST", "ALT", "ALKPHOS", "BILITOT", "PROT", "ALBUMIN" in the last 72 hours.  No results for input(s): "LIPASE", "AMYLASE" in the last 72 hours. CBC: Recent Labs    09/15/23 0328  WBC 8.5  HGB 10.4*  HCT 33.2*  MCV 98.5  PLT 160   Cardiac Enzymes: No results for input(s): "CKTOTAL", "CKMB", "CKMBINDEX", "TROPONINIHS" in the last 72 hours.  BNP: No results for input(s): "BNP" in the last 72 hours.  D-Dimer: No results for input(s): "DDIMER" in the last 72 hours. Hemoglobin A1C: No results for input(s): "HGBA1C" in the last 72 hours.  Fasting Lipid Panel: No results for input(s): "CHOL", "HDL", "LDLCALC", "TRIG", "CHOLHDL", "LDLDIRECT" in the last 72 hours.  Thyroid Function Tests: No results for input(s): "TSH", "T4TOTAL", "T3FREE", "THYROIDAB" in the last 72 hours.  Invalid input(s): "FREET3"  Anemia Panel: No results for input(s): "VITAMINB12", "FOLATE", "FERRITIN", "TIBC", "IRON", "RETICCTPCT" in the last 72 hours.   Radiology: US RENAL  Result Date: 09/11/2023 CLINICAL DATA:  Acute on chronic renal failure EXAM: RENAL / URINARY TRACT ULTRASOUND COMPLETE COMPARISON:  07/29/2023.  CT 09/10/2023 FINDINGS: Right Kidney: Renal measurements: 10.3 x 6.2 x 5.8 cm = volume: 192 mL. Echogenicity within normal limits. No mass or hydronephrosis visualized. Left Kidney: Renal measurements: 7.0 x 3.5 x 2.8 cm = volume: 37 mL. Atrophic with cortical thinning. No mass or hydronephrosis. Bladder: Decompressed with Foley catheter in place. Other: Moderate  ascites in the abdomen and pelvis. IMPRESSION: No acute findings. Atrophic left kidney, stable. Moderate ascites. Electronically Signed   By:  Charlett Nose M.D.   On: 09/11/2023 19:18   ECHOCARDIOGRAM COMPLETE  Result Date: 09/11/2023    ECHOCARDIOGRAM REPORT   Patient Name:   Taylor [name] Date of Exam: 09/11/2023 Medical Rec #:  [id number]    Height:       70.0 in Accession #:    [id number]   Weight:       158.3 lb Date of Birth:  Aug 07, 1941   BSA:          1.890 m Patient Age:    81 years     BP:           103/69 mmHg Patient Gender: M            HR:           62 bpm. Exam Location:  [facility] Procedure: 2D Echo, 3D Echo, Cardiac Doppler and Color Doppler Indications:     Syncope  History:         Patient has prior history of Echocardiogram examinations, most                  recent 10/30/2022. CHF and Cardiomyopathy, CAD and Previous                  Myocardial Infarction, Pacemaker and Defibrillator, Stroke,  [facility]IC CARDIOLOGY Progress NOTE       Patient ID: Taylor [name] MRN: [id number] DOB/AGE: 24-Mar-1941 82 y.o.  Admit date: 09/10/2023 Referring Physician Zada Girt, NP Primary Physician Malva Limes, MD  Primary Cardiologist Dr. Marcina Millard Reason for Consultation Chronic Heart Failure, EF < 20%  HPI: Taylor [name] is a 82 y.o. male  with a past medical history of paroxysmal atrial fibrillation (Eliquis), chronic HFrEF (EF < 20%), s/p CABG (08/2006), s/p dual-chamber ICD (2014), SSS, HTN, HLD, type 2 diabetes, pancreatitis, ischemic cardiomyopathy, ventricular fibrillation arrest (1994) s/p MI infarction, bilateral carotid artery stenosis s/p left carotid stent (06/2022), abdominal aortic aneurysm s/p endovascular repair (2008), CKD stage 4, COPD (baseline 2L [place]) liver cirrhosis with ascites (CT 08/2023), who presented to the ED on 09/10/2023 for frequent falls and generalized weakness. Cardiology was consulted for further evaluation.   Interval History: -Patient resting comfortably this AM, denies any chest pain. Remains on nitroglycerin gtt.  -BP and HR controlled. Remains on midodrine. Appears euvolemic. -Cr improved this AM, still elevated above baseline.    Review of systems complete and found to be negative unless listed above    Past Medical History:  Diagnosis Date   AAA (abdominal aortic aneurysm) (HCC) 06/03/2007   [facility]; Dr. Hart [place]   AICD (automatic cardioverter/defibrillator) present    Arrhythmia    atrial fibrillation   Barrett's esophagus    Bladder cancer (HCC)    Bradycardia    CAD (coronary artery disease)    CAP (community acquired pneumonia) 11/13/2019   CHF (congestive heart failure) (HCC)    Cluster headache    COVID-19 09/27/2021   DDD (degenerative disc disease), lumbar    Diabetes mellitus without complication (HCC)    Dyspnea    WITH EXERTION   Edema    LEFT ANKLE   Fracture of skull base (HCC) 1997    due to fall   GERD (gastroesophageal reflux disease)    Gout    History of bladder cancer 12/1995   Hyperlipidemia    Hypertension    Hypocalcemia 04/19/2020   Possibly secondary to diuretics.   Low=5.9 04/19/2020   Malignant melanoma (HCC) 12/2012   right dorsal forearm excised   Myocardial infarction [facility])    LAST 2014   Osteoarthritis of knee    Other specified complication of vascular prosthetic devices, implants and grafts, initial encounter (HCC) 08/22/2021   Pacemaker 10/10/2006   Pancreatitis 11/22/2019   Pneumonia    2016   Pre-diabetes    Psoriasis    Rib fracture 1997   due to fall   Sleep apnea    CPAP   Stroke [facility])    Venous incompetence     Past Surgical History:  Procedure Laterality Date   ABDOMINAL AORTIC ANEURYSM REPAIR  06/03/2007   [facility]; Dr. Hart [place]   ANGIOPLASTY  1994   [place]   BLADDER TUMOR EXCISION  12/1995   CAROTID PTA/STENT INTERVENTION Left 07/23/2022   Procedure: CAROTID PTA/STENT INTERVENTION;  Surgeon: Renford Dills, MD;  Location: [facility] INVASIVE CV LAB;  Service: Cardiovascular;  Laterality: Left;   CATARACT EXTRACTION W/PHACO Left 10/22/2016   Procedure: CATARACT EXTRACTION PHACO AND INTRAOCULAR LENS PLACEMENT (IOC);  Surgeon: Galen Manila, MD;  Location: [facility] ORS;  Service: Ophthalmology;  Laterality: Left;  [place] 47.7 AP% 18.4 CDE 8.78 Fluid pack lot # [id number]   CATARACT EXTRACTION W/PHACO Right 12/10/2016   Procedure: CATARACT EXTRACTION PHACO AND INTRAOCULAR LENS PLACEMENT (  clubbing, cyanosis. No edema.  Neuro: Alert and oriented X 3. Psych: Answers questions appropriately.   Labs: Basic Metabolic Panel: Recent Labs     09/13/23 0251 09/14/23 0313 09/15/23 0328  NA 135 137 138  K 4.0 3.9 3.5  CL 91* 96* 99  CO2 27 29 29   GLUCOSE 127* 123* 138*  BUN 108* 97* 87*  CREATININE 5.17* 4.10* 3.09*  CALCIUM 8.7* 8.9 9.1  MG 2.2  --   --   PHOS 5.8*  --   --    Liver Function Tests: No results for input(s): "AST", "ALT", "ALKPHOS", "BILITOT", "PROT", "ALBUMIN" in the last 72 hours.  No results for input(s): "LIPASE", "AMYLASE" in the last 72 hours. CBC: Recent Labs    09/15/23 0328  WBC 8.5  HGB 10.4*  HCT 33.2*  MCV 98.5  PLT 160   Cardiac Enzymes: No results for input(s): "CKTOTAL", "CKMB", "CKMBINDEX", "TROPONINIHS" in the last 72 hours.  BNP: No results for input(s): "BNP" in the last 72 hours.  D-Dimer: No results for input(s): "DDIMER" in the last 72 hours. Hemoglobin A1C: No results for input(s): "HGBA1C" in the last 72 hours.  Fasting Lipid Panel: No results for input(s): "CHOL", "HDL", "LDLCALC", "TRIG", "CHOLHDL", "LDLDIRECT" in the last 72 hours.  Thyroid Function Tests: No results for input(s): "TSH", "T4TOTAL", "T3FREE", "THYROIDAB" in the last 72 hours.  Invalid input(s): "FREET3"  Anemia Panel: No results for input(s): "VITAMINB12", "FOLATE", "FERRITIN", "TIBC", "IRON", "RETICCTPCT" in the last 72 hours.   Radiology: US RENAL  Result Date: 09/11/2023 CLINICAL DATA:  Acute on chronic renal failure EXAM: RENAL / URINARY TRACT ULTRASOUND COMPLETE COMPARISON:  07/29/2023.  CT 09/10/2023 FINDINGS: Right Kidney: Renal measurements: 10.3 x 6.2 x 5.8 cm = volume: 192 mL. Echogenicity within normal limits. No mass or hydronephrosis visualized. Left Kidney: Renal measurements: 7.0 x 3.5 x 2.8 cm = volume: 37 mL. Atrophic with cortical thinning. No mass or hydronephrosis. Bladder: Decompressed with Foley catheter in place. Other: Moderate  ascites in the abdomen and pelvis. IMPRESSION: No acute findings. Atrophic left kidney, stable. Moderate ascites. Electronically Signed   By:  Charlett Nose M.D.   On: 09/11/2023 19:18   ECHOCARDIOGRAM COMPLETE  Result Date: 09/11/2023    ECHOCARDIOGRAM REPORT   Patient Name:   Taylor [name] Date of Exam: 09/11/2023 Medical Rec #:  [id number]    Height:       70.0 in Accession #:    [id number]   Weight:       158.3 lb Date of Birth:  Aug 07, 1941   BSA:          1.890 m Patient Age:    81 years     BP:           103/69 mmHg Patient Gender: M            HR:           62 bpm. Exam Location:  [facility] Procedure: 2D Echo, 3D Echo, Cardiac Doppler and Color Doppler Indications:     Syncope  History:         Patient has prior history of Echocardiogram examinations, most                  recent 10/30/2022. CHF and Cardiomyopathy, CAD and Previous                  Myocardial Infarction, Pacemaker and Defibrillator, Stroke,

## 2023-09-16 ENCOUNTER — Ambulatory Visit: Payer: Self-pay | Admitting: *Deleted

## 2023-09-16 DIAGNOSIS — J449 Chronic obstructive pulmonary disease, unspecified: Secondary | ICD-10-CM | POA: Diagnosis not present

## 2023-09-16 DIAGNOSIS — I5042 Chronic combined systolic (congestive) and diastolic (congestive) heart failure: Secondary | ICD-10-CM | POA: Diagnosis not present

## 2023-09-16 DIAGNOSIS — N179 Acute kidney failure, unspecified: Secondary | ICD-10-CM | POA: Diagnosis not present

## 2023-09-16 DIAGNOSIS — I2510 Atherosclerotic heart disease of native coronary artery without angina pectoris: Secondary | ICD-10-CM | POA: Diagnosis not present

## 2023-09-16 DIAGNOSIS — J189 Pneumonia, unspecified organism: Secondary | ICD-10-CM | POA: Diagnosis not present

## 2023-09-16 LAB — BASIC METABOLIC PANEL
Anion gap: 8 (ref 5–15)
BUN: 73 mg/dL — ABNORMAL HIGH (ref 8–23)
CO2: 29 mmol/L (ref 22–32)
Calcium: 8.9 mg/dL (ref 8.9–10.3)
Chloride: 97 mmol/L — ABNORMAL LOW (ref 98–111)
Creatinine, Ser: 2.42 mg/dL — ABNORMAL HIGH (ref 0.61–1.24)
GFR, Estimated: 26 mL/min — ABNORMAL LOW (ref >60)
Glucose, Bld: 125 mg/dL — ABNORMAL HIGH (ref 70–99)
Potassium: 3.6 mmol/L (ref 3.5–5.1)
Sodium: 134 mmol/L — ABNORMAL LOW (ref 135–145)

## 2023-09-16 LAB — GLUCOSE, CAPILLARY
Glucose-Capillary: 118 mg/dL — ABNORMAL HIGH (ref 70–99)
Glucose-Capillary: 128 mg/dL — ABNORMAL HIGH (ref 70–99)
Glucose-Capillary: 104 mg/dL — ABNORMAL HIGH (ref 70–99)
Glucose-Capillary: 139 mg/dL — ABNORMAL HIGH (ref 70–99)

## 2023-09-16 LAB — CBC
HCT: 30.9 % — ABNORMAL LOW (ref 39.0–52.0)
Hemoglobin: 9.8 g/dL — ABNORMAL LOW (ref 13.0–17.0)
MCH: 30.8 pg (ref 26.0–34.0)
MCHC: 31.7 g/dL (ref 30.0–36.0)
MCV: 97.2 fL (ref 80.0–100.0)
Platelets: 154 10*3/uL (ref 150–400)
RBC: 3.18 MIL/uL — ABNORMAL LOW (ref 4.22–5.81)
RDW: 17.8 % — ABNORMAL HIGH (ref 11.5–15.5)
WBC: 7.5 10*3/uL (ref 4.0–10.5)
nRBC: 0 % (ref 0.0–0.2)

## 2023-09-16 LAB — HEPARIN LEVEL (UNFRACTIONATED): Heparin Unfractionated: 0.24 [IU]/mL — ABNORMAL LOW (ref 0.30–0.70)

## 2023-09-16 MED ORDER — HEPARIN BOLUS VIA INFUSION
1100.0000 [IU] | Freq: Once | INTRAVENOUS | Status: AC
Start: 2023-09-16 — End: 2023-09-16
  Administered 2023-09-16: 1100 [IU] via INTRAVENOUS
  Filled 2023-09-16: qty 1100

## 2023-09-16 MED ORDER — METOPROLOL SUCCINATE ER 25 MG PO TB24
25.0000 mg | ORAL_TABLET | Freq: Every day | ORAL | Status: DC
  Administered 2023-09-17: 25 mg via ORAL
  Filled 2023-09-16: qty 1

## 2023-09-16 MED ORDER — CARVEDILOL 12.5 MG PO TABS: 12.5000 mg | ORAL_TABLET | Freq: Two times a day (BID) | ORAL | Status: DC

## 2023-09-16 MED ORDER — APIXABAN 2.5 MG PO TABS: 2.5000 mg | ORAL_TABLET | Freq: Two times a day (BID) | ORAL | Status: DC

## 2023-09-16 MED ORDER — CARVEDILOL 12.5 MG PO TABS
12.5000 mg | ORAL_TABLET | Freq: Two times a day (BID) | ORAL | Status: DC
  Administered 2023-09-16: 12.5 mg via ORAL
  Filled 2023-09-16: qty 1

## 2023-09-16 MED ORDER — APIXABAN 2.5 MG PO TABS
2.5000 mg | ORAL_TABLET | Freq: Two times a day (BID) | ORAL | Status: DC
  Administered 2023-09-16 – 2023-09-17 (×3): 2.5 mg via ORAL
  Filled 2023-09-16 (×3): qty 1

## 2023-09-16 MED ORDER — CARVEDILOL 12.5 MG PO TABS
12.5000 mg | ORAL_TABLET | Freq: Two times a day (BID) | ORAL | Status: AC
  Administered 2023-09-16: 12.5 mg via ORAL
  Filled 2023-09-16: qty 1

## 2023-09-16 NOTE — Progress Notes (Signed)
[facility] [place], [place] 09/16/23  Subjective:   Hospital day # 6 Patient with medical problems of sCHF with EF < 20%, CKD-4, HTN, HLD, DM, COPD on 2L O2, CAD with stent and hx of CABG, stroke, A fib on Eliquis, s/p of AICD, melanoma, psoriatic arthritis, pancreatitis, liver cirrhosis with ascites (shown by recent CT scan on 08/29/23)  Known to our practice from outpatient f/u of CKD  Patient laying in bed No family present Denies discomfort  Renal: 10/21 0701 - 10/22 0700 In: 963.6 [P.O.:240; I.V.:723.6] Out: 1390 [Urine:1390] Lab Results  Component Value Date   CREATININE 2.42 (H) 09/16/2023   CREATININE 3.09 (H) 09/15/2023   CREATININE 4.10 (H) 09/14/2023     Objective:  Vital signs in last 24 hours:  Temp:  [97.4 F (36.3 C)-98.2 F (36.8 C)] 97.4 F (36.3 C) (10/22 0758) Pulse Rate:  [57-77] 61 (10/22 0345) Resp:  [18-20] 20 (10/22 0345) BP: (106-119)/(70-77) 119/70 (10/22 0345) SpO2:  [98 %-100 %] 99 % (10/22 0758)  Weight change:  Filed Weights   09/10/23 2300 09/11/23 0500 09/12/23 0500  Weight: 71.8 kg 71.8 kg 75.8 kg    Intake/Output:    Intake/Output Summary (Last 24 hours) at 09/16/2023 1155 Last data filed at 09/16/2023 0554 Gross per 24 hour  Intake 723.61 ml  Output 1150 ml  Net -426.39 ml     Physical Exam: General: NAD, laying in the bed  HEENT Multiple bruises from facial fractures  Pulm/lungs Coarse breath sounds, normal breathing effort  CVS/Heart Irregular rhythm  Abdomen:  Soft, nontender, nondistended  Extremities: Trace edema  Neurologic: Alert, able to follow commands  Skin: Multiple scattered ecchymosis          Basic Metabolic Panel:  Recent Labs  Lab 09/10/23 1606 09/10/23 1801 09/11/23 0330 09/11/23 1035 09/12/23 0500 09/13/23 0251 09/14/23 0313 09/15/23 0328 09/16/23 0358  NA 135  --  132*   < > 131* 135 137 138 134*  K 6.8*   < > 6.0*   < > 4.4 4.0 3.9 3.5 3.6  CL 95*  --  95*   < >  90* 91* 96* 99 97*  CO2 25  --  24   < > 26 27 29 29 29   GLUCOSE 173*  --  90   < > 142* 127* 123* 138* 125*  BUN 93*  --  99*   < > 104* 108* 97* 87* 73*  CREATININE 5.46*  --  4.95*   < > 5.17* 5.17* 4.10* 3.09* 2.42*  CALCIUM 8.9  --  8.7*   < > 8.8* 8.7* 8.9 9.1 8.9  MG 2.8*  --  2.3  --  2.3 2.2  --   --   --   PHOS 6.5*  --  5.9*  --  7.0* 5.8*  --   --   --    < > = values in this interval not displayed.     CBC: Recent Labs  Lab 09/10/23 1606 09/11/23 0330 09/12/23 0500 09/15/23 0328 09/16/23 0358  WBC 13.9* 9.7 9.9 8.5 7.5  NEUTROABS 12.0*  --   --   --   --   HGB 12.3* 11.0* 11.1* 10.4* 9.8*  HCT 40.2 35.3* 35.0* 33.2* 30.9*  MCV 101.0* 98.9 97.0 98.5 97.2  PLT 133* 129* 152 160 154     No results found for: "HEPBSAG", "HEPBSAB", "HEPBIGM"    Microbiology:  Recent Results (from the past 240 hour(s))  Blood  culture (routine single)     Status: None   Collection Time: 09/10/23  4:06 PM   Specimen: BLOOD  Result Value Ref Range Status   Specimen Description   Final    BLOOD LEFT ANTECUBITAL Performed at [facility], [address] [place], [place] [address]    Special Requests   Final    BOTTLES DRAWN AEROBIC AND ANAEROBIC Blood Culture adequate volume Performed at [facility], [address], [place], [place] [address]    Culture   Final    NO GROWTH 5 DAYS Performed at [facility], [address] [place], [place] [address]    Report Status 09/15/2023 FINAL  Final  Culture, blood (single)     Status: None   Collection Time: 09/10/23  5:02 PM   Specimen: BLOOD  Result Value Ref Range Status   Specimen Description BLOOD BLOOD RIGHT FOREARM  Final   Special Requests   Final    BOTTLES DRAWN AEROBIC AND ANAEROBIC Blood Culture results may not be optimal due to an inadequate volume of blood received in culture bottles   Culture   Final    NO GROWTH 5 DAYS Performed at [facility], [address],  [place], [place] [address]    Report Status 09/15/2023 FINAL  Final  SARS Coronavirus 2 by RT PCR (hospital order, performed in [facility] hospital lab) *cepheid single result test* Anterior Nasal Swab     Status: None   Collection Time: 09/10/23  9:38 PM   Specimen: Anterior Nasal Swab  Result Value Ref Range Status   SARS Coronavirus 2 by RT PCR NEGATIVE NEGATIVE Final    Comment: (NOTE) SARS-CoV-2 target nucleic acids are NOT DETECTED.  The SARS-CoV-2 RNA is generally detectable in upper and lower respiratory specimens during the acute phase of infection. The lowest concentration of SARS-CoV-2 viral copies this assay can detect is 250 copies / mL. A negative result does not preclude SARS-CoV-2 infection and should not be used as the sole basis for treatment or other patient management decisions.  A negative result may occur with improper specimen collection / handling, submission of specimen other than nasopharyngeal swab, presence of viral mutation(s) within the areas targeted by this assay, and inadequate number of viral copies (<250 copies / mL). A negative result must be combined with clinical observations, patient history, and epidemiological information.  Fact Sheet for Patients:   RoadLapTop.co.za  Fact Sheet for Healthcare Providers: http://kim-miller.com/  This test is not yet approved or  cleared by the [place] FDA and has been authorized for detection and/or diagnosis of SARS-CoV-2 by FDA under an Emergency Use Authorization (EUA).  This EUA will remain in effect (meaning this test can be used) for the duration of the COVID-19 declaration under Section 564(b)(1) of the Act, 21 U.S.C. section 360bbb-3(b)(1), unless the authorization is terminated or revoked sooner.  Performed at [facility], [address] [place], [place] [address]   Respiratory (~20 pathogens) panel by PCR     Status: None   Collection  Time: 09/10/23  9:38 PM   Specimen: Nasopharyngeal Swab; Respiratory  Result Value Ref Range Status   Adenovirus NOT DETECTED NOT DETECTED Final   Coronavirus 229E NOT DETECTED NOT DETECTED Final    Comment: (NOTE) The Coronavirus on the Respiratory Panel, DOES NOT test for the novel  Coronavirus (2019 nCoV)    Coronavirus HKU1 NOT DETECTED NOT DETECTED Final   Coronavirus NL63 NOT DETECTED NOT DETECTED  Final   Coronavirus OC43 NOT DETECTED NOT DETECTED Final   Metapneumovirus NOT DETECTED NOT DETECTED Final   Rhinovirus / Enterovirus NOT DETECTED NOT DETECTED Final   Influenza A NOT DETECTED NOT DETECTED Final   Influenza B NOT DETECTED NOT DETECTED Final   Parainfluenza Virus 1 NOT DETECTED NOT DETECTED Final   Parainfluenza Virus 2 NOT DETECTED NOT DETECTED Final   Parainfluenza Virus 3 NOT DETECTED NOT DETECTED Final   Parainfluenza Virus 4 NOT DETECTED NOT DETECTED Final   Respiratory Syncytial Virus NOT DETECTED NOT DETECTED Final   Bordetella pertussis NOT DETECTED NOT DETECTED Final   Bordetella Parapertussis NOT DETECTED NOT DETECTED Final   Chlamydophila pneumoniae NOT DETECTED NOT DETECTED Final   Mycoplasma pneumoniae NOT DETECTED NOT DETECTED Final    Comment: Performed at [facility], [address] [place], [place] [address]  MRSA Next Gen by PCR, Nasal     Status: Abnormal   Collection Time: 09/10/23 11:04 PM   Specimen: Nasal Mucosa; Nasal Swab  Result Value Ref Range Status   MRSA by PCR Next Gen DETECTED (A) NOT DETECTED Final    Comment: RESULT CALLED TO, READ BACK BY AND VERIFIED WITH: MARIA CONZALES@0105  09/11/23 RH (NOTE) The GeneXpert MRSA Assay (FDA approved for NASAL specimens only), is one component of a comprehensive MRSA colonization surveillance program. It is not intended to diagnose MRSA infection nor to guide or monitor treatment for MRSA infections. Test performance is not FDA approved in patients less than 63 years old. Performed at  [facility], [address] [place], [place] [address]     Coagulation Studies: Recent Labs    09/14/23 0313 09/15/23 0328  LABPROT 18.2* 17.1*  INR 1.5* 1.4*    Urinalysis: No results for input(s): "COLORURINE", "LABSPEC", "PHURINE", "GLUCOSEU", "HGBUR", "BILIRUBINUR", "KETONESUR", "PROTEINUR", "UROBILINOGEN", "NITRITE", "LEUKOCYTESUR" in the last 72 hours.  Invalid input(s): "APPERANCEUR"     Imaging: No results found.   Medications:    heparin 1,000 Units/hr (09/16/23 0538)   nitroGLYCERIN 50 mcg/min (09/16/23 1151)     aspirin EC  81 mg Oral Daily   carvedilol  12.5 mg Oral BID WC   Chlorhexidine Gluconate Cloth  6 each Topical QHS   feeding supplement (NEPRO CARB STEADY)  237 mL Oral TID BM   insulin aspart  0-5 Units Subcutaneous QHS   insulin aspart  0-9 Units Subcutaneous TID WC   lactulose  10 g Oral BID   midodrine  5 mg Oral TID WC   multivitamin with minerals  1 tablet Oral Daily   pantoprazole  40 mg Oral Daily   sodium chloride flush  10 mL Intravenous Q12H   triamcinolone cream   Topical BID   acetaminophen, dextromethorphan-guaiFENesin, ipratropium-albuterol, morphine injection, ondansetron (ZOFRAN) IV  Assessment/ Plan:  82 y.o. male with sCHF with EF < 20%, CKD-4, HTN, HLD, DM, COPD on 2L O2, CAD with stent and hx of CABG, stroke, A fib on Eliquis, s/p of AICD, melanoma, psoriatic arthritis, pancreatitis, liver cirrhosis with ascites (noted on recent CT scan on 08/29/23) admitted on 09/10/2023 for Hyperkalemia [E87.5] SIRS (systemic inflammatory response syndrome) (HCC) [R65.10] AKI (acute kidney injury) (HCC) [N17.9] Injury of head, initial encounter [S09.90XA] Fall, initial encounter [W19.XXXA] Acute kidney injury superimposed on stage 4 chronic kidney disease (HCC) [N17.9, N18.4]  Acute kidney injury on chronic kidney disease stage IV Patient followed by our practice for chronic kidney disease. Most recent outpatient  labs-creatinine 2.65/GFR 24 from 07/01/2023. AKI likely  secondary to volume depletion and concurrent illness. Renal function has returned to baseline. Will continue to monitor. Diuretics remain held.   Hyperkalemia Potassium level of 6.8 at presentation. Corrected Continue holding potassium and spironolactone. Continue conservative measures.    Pneumonia/sepsis/lactic acidosis Completed antibiotics Continue supportive care.    LOS: 6 Wendee Beavers 10/22/202411:55 AM  [address] [place], [place] [phone]

## 2023-09-16 NOTE — Patient Outreach (Signed)
Care Coordination   Follow Up Visit Note   09/16/2023 Name: Taylor [name] MRN: [id number] DOB: 10-20-1941  Taylor [name] is a 82 y.o. year old male who sees [name], Demetrios Isaacs, MD for primary care.   What matters to the patients health and wellness today?   Per chart, patient had fall on 10/4, presented to the ED for evaluation, and was later admitted on 10/16 for the fall and HCAP, currently still admitted.  Plan is to discharge to SNF for short term rehab.     Goals Addressed             This Visit's Progress    Develop Plan of care for Management of CHF   Not on track    Care Coordination Interventions: Basic overview and discussion of pathophysiology of Heart Failure reviewed Provided education on low sodium diet Reviewed Heart Failure Action Plan in depth and provided written copy Assessed need for readable accurate scales in home Advised patient to weigh each morning after emptying bladder Discussed importance of daily weight and advised patient to weigh and record daily Reviewed role of diuretics in prevention of fluid overload and management of heart failure;  Readmitted to hospital        SDOH assessments and interventions completed:  No     Care Coordination Interventions:  Yes, provided   Interventions Today    Flowsheet Row Most Recent Value  Chronic Disease   Chronic disease during today's visit Chronic Kidney Disease/End Stage Renal Disease (ESRD), Congestive Heart Failure (CHF)  General Interventions   General Interventions Discussed/Reviewed Communication with  Communication with RN  [Notified hospital liaison of admission to hospital and plan for disposition]       Follow up plan:  Pending disposition from hospital/SNF    Encounter Outcome:  Patient Visit Completed   Taylor Durie RN, MSN, CCM [place]  [facility], [facility] RN Care Coordinator Direct Dial: [phone] / Main [phone] Fax [phone] Email:  [email or web address]: [email or web address]

## 2023-09-16 NOTE — Progress Notes (Signed)
PROGRESS NOTE    Taylor [name]   WUJ:[id number] DOB: 07-Mar-1941  DOA: 09/10/2023 Date of Service: 09/16/23 which is hospital day 6  PCP: Malva Limes, MD    HPI: Taylor [name] is a 82 y.o. male past medical history significant for HFrEF (< 20%), CKD stage IV, hypertension, hyperlipidemia, diabetes, COPD on chronic home 2 L, CAD, history of atrial fibrillation on Eliquis, who presents to ED from home via EMS for fall x3. Multiple falls past month. Concern for low BP. No LOC.   Pertinent recent tx/imaging: 10/04: visit ED due to fall - (+)minimally displaced and comminuted radial styloid fracture --> splint to left forearm. (+)fx nasal bones and L maxilla.   10/10: ENT visit, discuss repair options for facial fx, not interested, conservative mgt.   Hospital course / significant events:  10/16: to ED. (+)sepsis, severe - hypothermic Tmin 55F, low BP 80s/60s, AKI, hyperkalemic K max 6.8 improved w/ tx, Cr on admission 5.46 above baseline 2.7, lactic max 3.8. CT (+)PNA RML and LLL, mod R pleural effusion also noted on previous CT. Tx midodrine, steroids, fluids, vanc/cefepime, needing pressors, to ICU 10/17: BP improved, renal fxn and K improved, Echo LVEF <20%, severely decreased fxn and global hypokinesis, grade III diastolic dysfunction, severe RV overload, severe RA and LA dilation. Palliative care consult, see note. Cardiology consult 10/18: stable off pressors 10/19: no improvement in renal function 10/20: some improved renal fxn, continued chest pain, agrees for DNR/DNI see A/P below. Worsening chest pain, requiring nitroglycerin gtt, per cardiology not candidate for intervention so will not get troponin, will cont nitroglycerin and heparin gtt for 24-48 hours.  10/21: remains on heparin and nitrog drips, palliative care following, pt/family desiring aggressive measures except CPR/resuscitation in event of arrest.  10/22: weaning off nitrog gtt per cardiology.  Consultants:   Nephrology PCCU Palliative care  Cardiology, advanced HF team   Procedures/Surgeries: none      ASSESSMENT & PLAN:    INFECTIOUS   Severe sepsis due to HCAP on CT, RML, LLL. POA (+)MRSA screen, pending cultures  Neg COVID and resp viral PCR  meets criteria for severe sepsis with WBC 13.9, hypothermia with temperature 92.1.  Lactic acid is elevated at 3.8 --> 2.9. Patient has been persistently hypotensive. Completed abx  Midodrine continuing  Hypothermia - resolved Tx sepsis as above     RENAL  Acute renal failure superimposed on stage 4 chronic kidney disease Hyperkalemia and hyperphosphatemia likely as result of AKI  creatinine 5.46, BUN 93 and GFR 10 (recent baseline creatinine 2.82 and GFR 22 on 10//24) --> Cr 3.18 today 09/16/23  Ddx include UTI, dehydration and continuation of diuretics.  ATN is possible due to hypotension. Question hepatorenal syndrome.   Nephrology and cardiology following  Hold nephrotoxic rx  Poor candidate for long term dialysis but renal fxn slowly improving, suspect will remain very likely for cardiorenal syndrome complications going forward   Hyperkalemia: resolved Nephrology following  Monitor BMP  Hyponatremia likely d/t AKI resolved Monitor BMP Treat underlying causes    CARDIAC  Persistent chest pain / unstable angina Wean/dc nitrog gtt and heparin gtt per cardiology  Cardiology following If cardiopulmonary decompensation such that death is imminent, have ordered morphine push asap and RN to alert hospitalist coverage prior to administration of this, essentially if pulseless / fatal arrhythmia will do what we can to relieve pain but I have advised the patient that decompensation may be sudden and we may not be able to  help his pain if/when that occurs. Palliative care has been following. Patient is resistant to accept the severity of his heart disease, states he thinks chest pain is rib pain from a recent fall.   Chronic  combined systolic and diastolic CHF (congestive heart failure)  2D echo on 10/30/2022 showed EF<20%.   Patient has elevated BNP 647 which is below his baseline, no leg edema or JVD.  Does not seem to have CHF exacerbation Echo this admission: Echo LVEF <20%, severely decreased fxn and global hypokinesis, grade III diastolic dysfunction, severe RV overload, severe RA and LA dilation Hold diuretics due to severe sepsis, Nephrology ok for resume low dose on discharge  Watch volume status closely  Cardiology following - no plans for further diagnostics at this time Very poor prognosis, family and patient agreeable for no CPR but are not interested in comfort measures only at this time despite worsening chest pain requiring nitrog drip which he may not be able to come off of without severe pain.   Hypotension Multifactorial - DDx including severe sepsis, dehydration and continuation for diuretics.  Hepatorenal syndrome is potential differential diagnosis. Low CO d/t CHF.  In ED, received solu-cortef, midodrine, albumin  Tx sepsis as above  Midodrine Cardiology team following Echocardiogram --> severe disease see above   Hx of CAD S/P percutaneous coronary angioplasty and myocardial injury:  trop  19 and trended down   Denies chest pain Continue Aspirin hold lipitor due to liver cirrhosis hold imdur due to hypotension   Essential (primary) hypertension Hold medications due to hypotension   Atrial fibrillation (HCC): rate controlled/bradycardic, HR 50-60s Hold Coreg d/t bradycardia and hypotension  restart Eliquis   Hypercholesteremia hold Lipitor d/t liver disease    RESPIRATORY   Chronic obstructive pulmonary disease, unspecified COPD type Chronic respiratory failure on 2L O2 [place] at home  Bronchodilators, as needed Mucinex Supplemental O2 as needed  Sore throat / sinus dryness today, humidify O2     NEUROLOGIC   History of CVA (cerebrovascular accident) Aspirin Holding statin  d/t cirrhosis    HEMATOLOGIC  Elevated INR and thrombocytopenia likely d/t liver disease monitor   GASTRO  Liver cirrhosis  Non-alcoholic Liver Cirrhosis with ascites  Hyperammonemia, mild - elevated 42.  Patient is alert, oriented x 3. Start lactulose 10 g twice daily   ENDOCRINE  Diabetes Type 2, well controlled w/ A1C 6.6 Monitor Glc w/ daily labs SSI if needed   OTHER  Advanced care planning - discussion 09/12/23 and 09/14/23  D/w patient and wife on rounds this 10/18 - cardiac/renal prognosis is poor and I do not believe he would recover if he were to decompensate (worsening HF, hypotension not responding to pressors, cardiac arrest/arhythmia). I was clear that I recommend against CPR as this would be painful without reasonable chance of benefit. Alternative would be that if he were to decompensate such that death felt to be imminent, we would ensure he was not in pain or distress. We would do any and all treatments short of CPR/intubation, meaning a no-CPR status would not mean "do nothing"  Revisited this issue again morning 09/14/23. Pt having chest pain on and off. I discussed with him that this could mean heart attack which could decompensate quickly / unpredictably, vs angina that is likely to come and go indefinitely but is an indicator of worsening heart disease. I again stated that CPR is certain to cause significant pain/stress and is very unlikely to result in any meaningful benefit. He agrees  for DO NOT RESUSCITATE as in NO CPR/intubation. RN Shanda Bumps T present for this conversation approx 10:00 09/14/23 and I will discuss with family later today when they are here. - see note If cardiopulmonary decompensation such that death is imminent, have ordered morphine push asap and RN to alert hospitalist coverage prior to administration of this, essentially if pulseless / fatal arrhythmia will do what we can to relieve pain but I have advised the patient that decompensation may  be sudden and we may not be able to help his pain if/when that occurs.  Palliative care has been following.  Patient is resistant to accept the severity of his heart disease, states he thinks chest pain is rib pain from a recent fall.   Fall fall precaution PT/OT Holding Eliquis   Obstructive sleep apnea CPAP as toleratred   Recent radial styloid fracture_left:: on splint prn tylenol   Knee pain suspect gout flare No effusion evident [place] Low threshold ortho consult for aspiration but no obvious effusion on exam Monitor     underweight based on BMI: Body mass index is 23.98 kg/m. Concern for mild-moderate protein calorie malnutrition based on BMI, low albumin, multiple medical comorbidities     DVT prophylaxis: SCD, ASA.cardiology restart eliquis  IV fluids: no continuous IV fluids  Nutrition: cardiac/carb diet  Central lines / invasive devices: none  Code Status: FULL CODE ACP documentation reviewed: none on file in [facility]  TOC needs: TBD Barriers to dispo / significant pending items: clinical improvement, expect may not be able to come off nitrog gtt without chest pain recurrence, monitor and reinvolve palliative prn              Subjective / Brief ROS:  Patient reports chest pain and SOB are improved   Family Communication: wife is at bedside on rounds     Objective Findings:  Vitals:   09/16/23 0758 09/16/23 0900 09/16/23 1200 09/16/23 1237  BP:      Pulse:      Resp:      Temp: (!) 97.4 F (36.3 C)  98 F (36.7 C)   TempSrc: Oral  Axillary   SpO2: 99% 98% 98% 99%  Weight:      Height:        Intake/Output Summary (Last 24 hours) at 09/16/2023 1432 Last data filed at 09/16/2023 0554 Gross per 24 hour  Intake 723.61 ml  Output 1150 ml  Net -426.39 ml   Filed Weights   09/10/23 2300 09/11/23 0500 09/12/23 0500  Weight: 71.8 kg 71.8 kg 75.8 kg    Examination:  Physical Exam Constitutional:      Appearance: He is  ill-appearing.  Cardiovascular:     Rate and Rhythm: Normal rate. Rhythm irregular.     Heart sounds: Murmur heard.     Comments: Heart sounds distant Pulmonary:     Effort: Pulmonary effort is normal. No respiratory distress.     Breath sounds: Rales present.     Comments: Breath sounds diffusely diminished Skin:    General: Skin is warm and dry.  Neurological:     General: No focal deficit present.     Mental Status: He is alert and oriented to person, place, and time. Mental status is at baseline.  Psychiatric:        Mood and Affect: Mood normal.        Behavior: Behavior normal.          Scheduled Medications:   apixaban  2.5 mg Oral  BID   aspirin EC  81 mg Oral Daily   carvedilol  12.5 mg Oral BID WC   Chlorhexidine Gluconate Cloth  6 each Topical QHS   feeding supplement (NEPRO CARB STEADY)  237 mL Oral TID BM   insulin aspart  0-5 Units Subcutaneous QHS   insulin aspart  0-9 Units Subcutaneous TID WC   lactulose  10 g Oral BID   midodrine  5 mg Oral TID WC   multivitamin with minerals  1 tablet Oral Daily   pantoprazole  40 mg Oral Daily   sodium chloride flush  10 mL Intravenous Q12H   triamcinolone cream   Topical BID    Continuous Infusions:  nitroGLYCERIN 30 mcg/min (09/16/23 1424)     PRN Medications:  acetaminophen, dextromethorphan-guaiFENesin, ipratropium-albuterol, morphine injection, ondansetron (ZOFRAN) IV  Antimicrobials from admission:  Anti-infectives (From admission, onward)    Start     Dose/Rate Route Frequency Ordered Stop   09/12/23 1800  ceFEPIme (MAXIPIME) 1 g in sodium chloride 0.9 % 100 mL IVPB  Status:  Discontinued        1 g 200 mL/hr over 30 Minutes Intravenous Every 24 hours 09/10/23 2041 09/11/23 1042   09/11/23 1800  ceFEPIme (MAXIPIME) 1 g in sodium chloride 0.9 % 100 mL IVPB  Status:  Discontinued        1 g 200 mL/hr over 30 Minutes Intravenous Every 24 hours 09/10/23 2039 09/10/23 2041   09/10/23 2045  vancomycin  (VANCOREADY) IVPB 1750 mg/350 mL        1,750 mg 175 mL/hr over 120 Minutes Intravenous  Once 09/10/23 2035 09/10/23 2356   09/10/23 2035  vancomycin variable dose per unstable renal function (pharmacist dosing)  Status:  Discontinued         Does not apply See admin instructions 09/10/23 2035 09/11/23 1042   09/10/23 1630  levofloxacin (LEVAQUIN) IVPB 750 mg        750 mg 100 mL/hr over 90 Minutes Intravenous  Once 09/10/23 1628 09/10/23 1844           Data Reviewed:  I have personally reviewed the following...  CBC: Recent Labs  Lab 09/10/23 1606 09/11/23 0330 09/12/23 0500 09/15/23 0328 09/16/23 0358  WBC 13.9* 9.7 9.9 8.5 7.5  NEUTROABS 12.0*  --   --   --   --   HGB 12.3* 11.0* 11.1* 10.4* 9.8*  HCT 40.2 35.3* 35.0* 33.2* 30.9*  MCV 101.0* 98.9 97.0 98.5 97.2  PLT 133* 129* 152 160 154   Basic Metabolic Panel: Recent Labs  Lab 09/10/23 1606 09/10/23 1801 09/11/23 0330 09/11/23 1035 09/12/23 0500 09/13/23 0251 09/14/23 0313 09/15/23 0328 09/16/23 0358  NA 135  --  132*   < > 131* 135 137 138 134*  K 6.8*   < > 6.0*   < > 4.4 4.0 3.9 3.5 3.6  CL 95*  --  95*   < > 90* 91* 96* 99 97*  CO2 25  --  24   < > 26 27 29 29 29   GLUCOSE 173*  --  90   < > 142* 127* 123* 138* 125*  BUN 93*  --  99*   < > 104* 108* 97* 87* 73*  CREATININE 5.46*  --  4.95*   < > 5.17* 5.17* 4.10* 3.09* 2.42*  CALCIUM 8.9  --  8.7*   < > 8.8* 8.7* 8.9 9.1 8.9  MG 2.8*  --  2.3  --  2.3  2.2  --   --   --   PHOS 6.5*  --  5.9*  --  7.0* 5.8*  --   --   --    < > = values in this interval not displayed.   GFR: Estimated Creatinine Clearance: 25.1 mL/min (A) (by C-G formula based on SCr of 2.42 mg/dL (H)). Liver Function Tests: Recent Labs  Lab 09/10/23 1606 09/11/23 0330  AST 28 23  ALT 10 9  ALKPHOS 141* 119  BILITOT 3.1* 2.6*  PROT 7.9 7.1  ALBUMIN 2.8* 2.8*   No results for input(s): "LIPASE", "AMYLASE" in the last 168 hours. Recent Labs  Lab 09/10/23 2138  AMMONIA  42*   Coagulation Profile: Recent Labs  Lab 09/11/23 0330 09/12/23 0500 09/13/23 0251 09/14/23 0313 09/15/23 0328  INR 3.0* 2.3* 1.7* 1.5* 1.4*   Cardiac Enzymes: Recent Labs  Lab 09/10/23 1606  CKTOTAL 52   BNP (last 3 results) No results for input(s): "PROBNP" in the last 8760 hours. HbA1C: No results for input(s): "HGBA1C" in the last 72 hours.  CBG: Recent Labs  Lab 09/15/23 1154 09/15/23 1628 09/15/23 2108 09/16/23 0758 09/16/23 1124  GLUCAP 151* 120* 147* 118* 128*   Lipid Profile: No results for input(s): "CHOL", "HDL", "LDLCALC", "TRIG", "CHOLHDL", "LDLDIRECT" in the last 72 hours.  Thyroid Function Tests: No results for input(s): "TSH", "T4TOTAL", "FREET4", "T3FREE", "THYROIDAB" in the last 72 hours.  Anemia Panel: No results for input(s): "VITAMINB12", "FOLATE", "FERRITIN", "TIBC", "IRON", "RETICCTPCT" in the last 72 hours. Most Recent Urinalysis On File:     Component Value Date/Time   COLORURINE YELLOW (A) 09/11/2023 0409   APPEARANCEUR HAZY (A) 09/11/2023 0409   LABSPEC 1.015 09/11/2023 0409   PHURINE 5.0 09/11/2023 0409   GLUCOSEU 50 (A) 09/11/2023 0409   HGBUR MODERATE (A) 09/11/2023 0409   BILIRUBINUR NEGATIVE 09/11/2023 0409   BILIRUBINUR negative 03/31/2018 1452   KETONESUR NEGATIVE 09/11/2023 0409   PROTEINUR NEGATIVE 09/11/2023 0409   UROBILINOGEN 0.2 03/31/2018 1452   NITRITE NEGATIVE 09/11/2023 0409   LEUKOCYTESUR NEGATIVE 09/11/2023 0409   Sepsis Labs: @LABRCNTIP (procalcitonin:4,lacticidven:4) Microbiology: Recent Results (from the past 240 hour(s))  Blood culture (routine single)     Status: None   Collection Time: 09/10/23  4:06 PM   Specimen: BLOOD  Result Value Ref Range Status   Specimen Description   Final    BLOOD LEFT ANTECUBITAL Performed at [facility], [address], [place], [place] [address]    Special Requests   Final    BOTTLES DRAWN AEROBIC AND ANAEROBIC Blood Culture adequate volume Performed  at [facility], [address], [place], [place] [address]    Culture   Final    NO GROWTH 5 DAYS Performed at [facility], [address], [place], [place] [address]    Report Status 09/15/2023 FINAL  Final  Culture, blood (single)     Status: None   Collection Time: 09/10/23  5:02 PM   Specimen: BLOOD  Result Value Ref Range Status   Specimen Description BLOOD BLOOD RIGHT FOREARM  Final   Special Requests   Final    BOTTLES DRAWN AEROBIC AND ANAEROBIC Blood Culture results may not be optimal due to an inadequate volume of blood received in culture bottles   Culture   Final    NO GROWTH 5 DAYS Performed at [facility], [address], [place], [place] [address]    Report Status 09/15/2023 FINAL  Final  SARS Coronavirus  2 by RT PCR (hospital order, performed in [facility] hospital lab) *cepheid single result test* Anterior Nasal Swab     Status: None   Collection Time: 09/10/23  9:38 PM   Specimen: Anterior Nasal Swab  Result Value Ref Range Status   SARS Coronavirus 2 by RT PCR NEGATIVE NEGATIVE Final    Comment: (NOTE) SARS-CoV-2 target nucleic acids are NOT DETECTED.  The SARS-CoV-2 RNA is generally detectable in upper and lower respiratory specimens during the acute phase of infection. The lowest concentration of SARS-CoV-2 viral copies this assay can detect is 250 copies / mL. A negative result does not preclude SARS-CoV-2 infection and should not be used as the sole basis for treatment or other patient management decisions.  A negative result may occur with improper specimen collection / handling, submission of specimen other than nasopharyngeal swab, presence of viral mutation(s) within the areas targeted by this assay, and inadequate number of viral copies (<250 copies / mL). A negative result must be combined with clinical observations, patient history, and epidemiological information.  Fact Sheet for Patients:    RoadLapTop.co.za  Fact Sheet for Healthcare Providers: http://kim-miller.com/  This test is not yet approved or  cleared by the [place] FDA and has been authorized for detection and/or diagnosis of SARS-CoV-2 by FDA under an Emergency Use Authorization (EUA).  This EUA will remain in effect (meaning this test can be used) for the duration of the COVID-19 declaration under Section 564(b)(1) of the Act, 21 U.S.C. section 360bbb-3(b)(1), unless the authorization is terminated or revoked sooner.  Performed at [facility], [address] [place], [place] [address]   Respiratory (~20 pathogens) panel by PCR     Status: None   Collection Time: 09/10/23  9:38 PM   Specimen: Nasopharyngeal Swab; Respiratory  Result Value Ref Range Status   Adenovirus NOT DETECTED NOT DETECTED Final   Coronavirus 229E NOT DETECTED NOT DETECTED Final    Comment: (NOTE) The Coronavirus on the Respiratory Panel, DOES NOT test for the novel  Coronavirus (2019 nCoV)    Coronavirus HKU1 NOT DETECTED NOT DETECTED Final   Coronavirus NL63 NOT DETECTED NOT DETECTED Final   Coronavirus OC43 NOT DETECTED NOT DETECTED Final   Metapneumovirus NOT DETECTED NOT DETECTED Final   Rhinovirus / Enterovirus NOT DETECTED NOT DETECTED Final   Influenza A NOT DETECTED NOT DETECTED Final   Influenza B NOT DETECTED NOT DETECTED Final   Parainfluenza Virus 1 NOT DETECTED NOT DETECTED Final   Parainfluenza Virus 2 NOT DETECTED NOT DETECTED Final   Parainfluenza Virus 3 NOT DETECTED NOT DETECTED Final   Parainfluenza Virus 4 NOT DETECTED NOT DETECTED Final   Respiratory Syncytial Virus NOT DETECTED NOT DETECTED Final   Bordetella pertussis NOT DETECTED NOT DETECTED Final   Bordetella Parapertussis NOT DETECTED NOT DETECTED Final   Chlamydophila pneumoniae NOT DETECTED NOT DETECTED Final   Mycoplasma pneumoniae NOT DETECTED NOT DETECTED Final    Comment: Performed at  [facility], [address] [place], [place] [address]  MRSA Next Gen by PCR, Nasal     Status: Abnormal   Collection Time: 09/10/23 11:04 PM   Specimen: Nasal Mucosa; Nasal Swab  Result Value Ref Range Status   MRSA by PCR Next Gen DETECTED (A) NOT DETECTED Final    Comment: RESULT CALLED TO, READ BACK BY AND VERIFIED WITH: MARIA CONZALES@[id number]  09/11/23 RH (NOTE) The GeneXpert MRSA Assay (FDA approved for NASAL specimens only), is one component of a comprehensive  MRSA colonization surveillance program. It is not intended to diagnose MRSA infection nor to guide or monitor treatment for MRSA infections. Test performance is not FDA approved in patients less than 39 years old. Performed at [facility], [address] [place], [place] [address]       Radiology Studies last 3 days: No results found.     Time spent: 50 min     Sunnie Nielsen, DO [facility] 09/16/2023, 2:32 PM    Dictation software may have been used to generate the above note. Typos may occur and escape review in typed/dictated notes. Please contact Dr Lyn Hollingshead directly for clarity if needed.  Staff may message me via secure chat in Epic  but this may not receive an immediate response,  please page me for urgent matters!  If 7PM-7AM, please contact night coverage [email or web address]

## 2023-09-16 NOTE — Consult Note (Addendum)
[facility] Inpatient Consult   09/16/2023  SWANSON KOSTOFF 1941/01/18 [id number]  Primary Care Provider:  Dr. Mila Merry [place] [facility]  Patient is currently active with Care Management for chronic disease management services.  Patient has been engaged by a Charity fundraiser.  Our community based plan of care has focused on disease management and community resource support.   Patient will receive a post hospital call and will be evaluated for assessments and disease process education.   Plan: Pending bed acceptance for SNF level of care.  Inpatient Transition Of Care [TOC] team member to make aware that Care Management following.  Of note, Care Management services does not replace or interfere with any services that are needed or arranged by inpatient [facility] care management team.   For additional questions or referrals please contact:   Elliot Cousin, RN, [facility] Liaison [place]   [facility], Population Health Office Hours MTWF  8:00 am-6:00 pm Direct Dial: [phone] mobile [phone] [Office toll free line] Office Hours are M-F 8:30 - 5 pm [email or web address]

## 2023-09-16 NOTE — TOC Progression Note (Signed)
Transition of Care [facility]) - Progression Note    Patient Details  Name: Taylor [name] MRN: [id number] Date of Birth: 01-Jun-1941  [facility]) CM/SW Contact  Truddie Hidden, RN Phone Number: 09/16/2023, 4:03 PM  Clinical Narrative:    Spoke with patient's wife regarding bed offers. She was given bed offers for [facility], [facility], [facility] and [facility]. Patient's wife would like time to think about choices. She was advised RNCM would follow up at a later time.    Expected Discharge Plan: Skilled Nursing Facility Barriers to Discharge: Continued Medical Work up  Expected Discharge Plan and Services       Living arrangements for the past 2 months: Single Family Home                                       Social Determinants of Health (SDOH) Interventions SDOH Screenings   Food Insecurity: No Food Insecurity (05/12/2023)  Housing: Low Risk  (05/12/2023)  Transportation Needs: No Transportation Needs (05/12/2023)  Utilities: Not At Risk (05/12/2023)  Alcohol Screen: Low Risk  (05/12/2023)  Depression (PHQ2-9): High Risk (08/15/2023)  Financial Resource Strain: Low Risk  (05/12/2023)  Physical Activity: Insufficiently Active (05/12/2023)  Social Connections: Moderately Isolated (05/12/2023)  Stress: Stress Concern Present (05/12/2023)  Tobacco Use: Medium Risk (09/10/2023)    Readmission Risk Interventions    09/12/2023    3:46 PM  Readmission Risk Prevention Plan  Transportation Screening Complete  Medication Review (RN Care Manager) Complete  PCP or Specialist appointment within 3-5 days of discharge Complete  HRI or Home Care Consult Complete  SW Recovery Care/Counseling Consult Complete  Palliative Care Screening Not Applicable  Skilled Nursing Facility Complete

## 2023-09-16 NOTE — Consult Note (Signed)
PHARMACY - ANTICOAGULATION CONSULT NOTE  Pharmacy Consult for Heparin  Indication: chest pain/ACS  Allergies  Allergen Reactions   Amlodipine Besylate Swelling    Had a reaction when taking with colcrys    Crestor [Rosuvastatin]     Muscle cramps and pain. Tolerates atorvastatin    Patient Measurements: Height: 5\' 10"  (177.8 cm) Weight: 75.8 kg (167 lb 1.7 oz) IBW/kg (Calculated) : 73 Heparin Dosing Weight: 71.8 kg  Vital Signs: Temp: 98 F (36.7 C) (10/22 0345) Temp Source: Oral (10/22 0345) BP: 119/70 (10/22 0345) Pulse Rate: 61 (10/22 0345)  Labs: Recent Labs    09/14/23 0313 09/14/23 1944 09/14/23 1944 09/15/23 0328 09/15/23 0653 09/15/23 1436 09/16/23 0358  HGB  --   --   --  10.4*  --   --   --   HCT  --   --   --  33.2*  --   --   --   PLT  --   --   --  160  --   --   --   APTT  --  161*  --   --  93*  --   --   LABPROT 18.2*  --   --  17.1*  --   --   --   INR 1.5*  --   --  1.4*  --   --   --   HEPARINUNFRC  --  0.87*   < >  --  0.54 0.38 0.24*  CREATININE 4.10*  --   --  3.09*  --   --  2.42*   < > = values in this interval not displayed.    Estimated Creatinine Clearance: 25.1 mL/min (A) (by C-G formula based on SCr of 2.42 mg/dL (H)).   Medical History: Past Medical History:  Diagnosis Date   AAA (abdominal aortic aneurysm) (HCC) 06/03/2007   [facility]; Dr. Hart [place]   AICD (automatic cardioverter/defibrillator) present    Arrhythmia    atrial fibrillation   Barrett's esophagus    Bladder cancer (HCC)    Bradycardia    CAD (coronary artery disease)    CAP (community acquired pneumonia) 11/13/2019   CHF (congestive heart failure) (HCC)    Cluster headache    COVID-19 09/27/2021   DDD (degenerative disc disease), lumbar    Diabetes mellitus without complication (HCC)    Dyspnea    WITH EXERTION   Edema    LEFT ANKLE   Fracture of skull base (HCC) 1997   due to fall   GERD (gastroesophageal reflux disease)    Gout     History of bladder cancer 12/1995   Hyperlipidemia    Hypertension    Hypocalcemia 04/19/2020   Possibly secondary to diuretics.   Low=5.9 04/19/2020   Malignant melanoma (HCC) 12/2012   right dorsal forearm excised   Myocardial infarction [facility])    LAST 2014   Osteoarthritis of knee    Other specified complication of vascular prosthetic devices, implants and grafts, initial encounter (HCC) 08/22/2021   Pacemaker 10/10/2006   Pancreatitis 11/22/2019   Pneumonia    2016   Pre-diabetes    Psoriasis    Rib fracture 1997   due to fall   Sleep apnea    CPAP   Stroke (HCC)    Venous incompetence     Assessment: 82 y.o. male past medical history significant for HFrEF (< 20%), CKD stage IV, hypertension, hyperlipidemia, diabetes, COPD on chronic home 2 L, CAD s/p  CABG, history of atrial fibrillation on Eliquis, who presents to ED from home via EMS for fall x3. CBC stable. Trop 15. Pharmacy consulted to start heparin infusion for ACS. Pt has been off apixaban for at least 5 days, but I do not predict false elevation of the heparin level, but will order both aPTT and heparin level for the first draw. INR was elevated but now trending down.   Date/Time  aPTT/HL Rate  Comment 10/20 1944 161/0.84 1000 units/hr Supra-therapeutic 10/21 0653  93/0.54  850 units/hr Therapeutic x 1 10/21 1436  -- / 0.38  850 un/hr Therapeutic x 2 10/22 0358            0.24           850 un/hr        SUBtherapeutic    Goal of Therapy:  Heparin level 0.3-0.7 units/ml Monitor platelets by anticoagulation protocol: Yes   Plan:  10/22:  HL @ 0358 = 0.24, SUBtherapeutic  - will order heparin 1100 units IV X 1 bolus and increase drip rate to 1000 units/hr  - will recheck HL 8 hrs after rate change  Continue to monitor H&H and platelets per protocol.  Beth Spackman D 09/16/2023 5:27 AM

## 2023-09-16 NOTE — Progress Notes (Signed)
well perfused. No clubbing, cyanosis. Trace edema.  Neuro: Alert and oriented X 3. Psych:  Answers questions appropriately.   Labs: Basic Metabolic Panel: Recent Labs    09/15/23 0328 09/16/23 0358  NA 138 134*  K 3.5 3.6  CL 99 97*  CO2 29 29  GLUCOSE 138* 125*  BUN 87* 73*  CREATININE 3.09* 2.42*  CALCIUM 9.1 8.9   Liver Function Tests: No results for input(s): "AST", "ALT", "ALKPHOS", "BILITOT", "PROT", "ALBUMIN" in the last 72 hours.  No results for input(s): "LIPASE", "AMYLASE" in the last 72 hours. CBC: Recent Labs    09/15/23 0328 09/16/23 0358  WBC 8.5 7.5  HGB 10.4* 9.8*  HCT 33.2* 30.9*  MCV 98.5 97.2  PLT 160 154   Cardiac Enzymes: No results for input(s): "CKTOTAL", "CKMB", "CKMBINDEX", "TROPONINIHS" in the last 72 hours.  BNP: No results for input(s): "BNP" in the last 72 hours.  D-Dimer: No results for input(s): "DDIMER" in the last 72 hours. Hemoglobin A1C: No results for input(s): "HGBA1C" in the last 72 hours.  Fasting Lipid Panel: No results for input(s): "CHOL", "HDL", "LDLCALC", "TRIG", "CHOLHDL", "LDLDIRECT" in the last 72 hours.  Thyroid Function Tests: No results for input(s): "TSH", "T4TOTAL", "T3FREE", "THYROIDAB" in the last 72 hours.  Invalid input(s): "FREET3"  Anemia Panel: No results for input(s): "VITAMINB12", "FOLATE", "FERRITIN", "TIBC", "IRON", "RETICCTPCT" in the last 72 hours.   Radiology: US RENAL  Result Date: 09/11/2023 CLINICAL DATA:  Acute on chronic renal failure EXAM: RENAL / URINARY TRACT ULTRASOUND COMPLETE COMPARISON:  07/29/2023.  CT 09/10/2023 FINDINGS: Right Kidney: Renal measurements: 10.3 x 6.2 x 5.8 cm = volume: 192 mL. Echogenicity within normal limits. No mass or hydronephrosis visualized. Left Kidney: Renal measurements: 7.0 x 3.5 x 2.8 cm = volume: 37 mL. Atrophic with cortical thinning. No mass or hydronephrosis. Bladder: Decompressed with Foley catheter in place. Other: Moderate  ascites in the abdomen and pelvis. IMPRESSION: No acute findings. Atrophic left kidney, stable. Moderate ascites.  Electronically Signed   By: Charlett Nose M.D.   On: 09/11/2023 19:18   ECHOCARDIOGRAM COMPLETE  Result Date: 09/11/2023    ECHOCARDIOGRAM REPORT   Patient Name:   Taylor [name] Date of Exam: 09/11/2023 Medical Rec #:  [id number]    Height:       70.0 in Accession #:    [id number]   Weight:       158.3 lb Date of Birth:  [birth year]   BSA:          1.890 m Patient Age:    82 years     BP:           103/69 mmHg Patient Gender: M            HR:           62 bpm. Exam Location:  [facility] Procedure: 2D Echo, 3D Echo, Cardiac Doppler and Color Doppler Indications:     Syncope  History:         Patient has prior history of Echocardiogram examinations, most                  recent 10/30/2022. CHF and Cardiomyopathy, CAD and Previous                  Myocardial Infarction, Pacemaker and Defibrillator, Stroke,                  Arrythmias:Atrial Fibrillation and Bradycardia,  well perfused. No clubbing, cyanosis. Trace edema.  Neuro: Alert and oriented X 3. Psych:  Answers questions appropriately.   Labs: Basic Metabolic Panel: Recent Labs    09/15/23 0328 09/16/23 0358  NA 138 134*  K 3.5 3.6  CL 99 97*  CO2 29 29  GLUCOSE 138* 125*  BUN 87* 73*  CREATININE 3.09* 2.42*  CALCIUM 9.1 8.9   Liver Function Tests: No results for input(s): "AST", "ALT", "ALKPHOS", "BILITOT", "PROT", "ALBUMIN" in the last 72 hours.  No results for input(s): "LIPASE", "AMYLASE" in the last 72 hours. CBC: Recent Labs    09/15/23 0328 09/16/23 0358  WBC 8.5 7.5  HGB 10.4* 9.8*  HCT 33.2* 30.9*  MCV 98.5 97.2  PLT 160 154   Cardiac Enzymes: No results for input(s): "CKTOTAL", "CKMB", "CKMBINDEX", "TROPONINIHS" in the last 72 hours.  BNP: No results for input(s): "BNP" in the last 72 hours.  D-Dimer: No results for input(s): "DDIMER" in the last 72 hours. Hemoglobin A1C: No results for input(s): "HGBA1C" in the last 72 hours.  Fasting Lipid Panel: No results for input(s): "CHOL", "HDL", "LDLCALC", "TRIG", "CHOLHDL", "LDLDIRECT" in the last 72 hours.  Thyroid Function Tests: No results for input(s): "TSH", "T4TOTAL", "T3FREE", "THYROIDAB" in the last 72 hours.  Invalid input(s): "FREET3"  Anemia Panel: No results for input(s): "VITAMINB12", "FOLATE", "FERRITIN", "TIBC", "IRON", "RETICCTPCT" in the last 72 hours.   Radiology: US RENAL  Result Date: 09/11/2023 CLINICAL DATA:  Acute on chronic renal failure EXAM: RENAL / URINARY TRACT ULTRASOUND COMPLETE COMPARISON:  07/29/2023.  CT 09/10/2023 FINDINGS: Right Kidney: Renal measurements: 10.3 x 6.2 x 5.8 cm = volume: 192 mL. Echogenicity within normal limits. No mass or hydronephrosis visualized. Left Kidney: Renal measurements: 7.0 x 3.5 x 2.8 cm = volume: 37 mL. Atrophic with cortical thinning. No mass or hydronephrosis. Bladder: Decompressed with Foley catheter in place. Other: Moderate  ascites in the abdomen and pelvis. IMPRESSION: No acute findings. Atrophic left kidney, stable. Moderate ascites.  Electronically Signed   By: Charlett Nose M.D.   On: 09/11/2023 19:18   ECHOCARDIOGRAM COMPLETE  Result Date: 09/11/2023    ECHOCARDIOGRAM REPORT   Patient Name:   Taylor [name] Date of Exam: 09/11/2023 Medical Rec #:  [id number]    Height:       70.0 in Accession #:    [id number]   Weight:       158.3 lb Date of Birth:  [birth year]   BSA:          1.890 m Patient Age:    82 years     BP:           103/69 mmHg Patient Gender: M            HR:           62 bpm. Exam Location:  [facility] Procedure: 2D Echo, 3D Echo, Cardiac Doppler and Color Doppler Indications:     Syncope  History:         Patient has prior history of Echocardiogram examinations, most                  recent 10/30/2022. CHF and Cardiomyopathy, CAD and Previous                  Myocardial Infarction, Pacemaker and Defibrillator, Stroke,                  Arrythmias:Atrial Fibrillation and Bradycardia,  well perfused. No clubbing, cyanosis. Trace edema.  Neuro: Alert and oriented X 3. Psych:  Answers questions appropriately.   Labs: Basic Metabolic Panel: Recent Labs    09/15/23 0328 09/16/23 0358  NA 138 134*  K 3.5 3.6  CL 99 97*  CO2 29 29  GLUCOSE 138* 125*  BUN 87* 73*  CREATININE 3.09* 2.42*  CALCIUM 9.1 8.9   Liver Function Tests: No results for input(s): "AST", "ALT", "ALKPHOS", "BILITOT", "PROT", "ALBUMIN" in the last 72 hours.  No results for input(s): "LIPASE", "AMYLASE" in the last 72 hours. CBC: Recent Labs    09/15/23 0328 09/16/23 0358  WBC 8.5 7.5  HGB 10.4* 9.8*  HCT 33.2* 30.9*  MCV 98.5 97.2  PLT 160 154   Cardiac Enzymes: No results for input(s): "CKTOTAL", "CKMB", "CKMBINDEX", "TROPONINIHS" in the last 72 hours.  BNP: No results for input(s): "BNP" in the last 72 hours.  D-Dimer: No results for input(s): "DDIMER" in the last 72 hours. Hemoglobin A1C: No results for input(s): "HGBA1C" in the last 72 hours.  Fasting Lipid Panel: No results for input(s): "CHOL", "HDL", "LDLCALC", "TRIG", "CHOLHDL", "LDLDIRECT" in the last 72 hours.  Thyroid Function Tests: No results for input(s): "TSH", "T4TOTAL", "T3FREE", "THYROIDAB" in the last 72 hours.  Invalid input(s): "FREET3"  Anemia Panel: No results for input(s): "VITAMINB12", "FOLATE", "FERRITIN", "TIBC", "IRON", "RETICCTPCT" in the last 72 hours.   Radiology: US RENAL  Result Date: 09/11/2023 CLINICAL DATA:  Acute on chronic renal failure EXAM: RENAL / URINARY TRACT ULTRASOUND COMPLETE COMPARISON:  07/29/2023.  CT 09/10/2023 FINDINGS: Right Kidney: Renal measurements: 10.3 x 6.2 x 5.8 cm = volume: 192 mL. Echogenicity within normal limits. No mass or hydronephrosis visualized. Left Kidney: Renal measurements: 7.0 x 3.5 x 2.8 cm = volume: 37 mL. Atrophic with cortical thinning. No mass or hydronephrosis. Bladder: Decompressed with Foley catheter in place. Other: Moderate  ascites in the abdomen and pelvis. IMPRESSION: No acute findings. Atrophic left kidney, stable. Moderate ascites.  Electronically Signed   By: Charlett Nose M.D.   On: 09/11/2023 19:18   ECHOCARDIOGRAM COMPLETE  Result Date: 09/11/2023    ECHOCARDIOGRAM REPORT   Patient Name:   Taylor [name] Date of Exam: 09/11/2023 Medical Rec #:  [id number]    Height:       70.0 in Accession #:    [id number]   Weight:       158.3 lb Date of Birth:  [birth year]   BSA:          1.890 m Patient Age:    82 years     BP:           103/69 mmHg Patient Gender: M            HR:           62 bpm. Exam Location:  [facility] Procedure: 2D Echo, 3D Echo, Cardiac Doppler and Color Doppler Indications:     Syncope  History:         Patient has prior history of Echocardiogram examinations, most                  recent 10/30/2022. CHF and Cardiomyopathy, CAD and Previous                  Myocardial Infarction, Pacemaker and Defibrillator, Stroke,                  Arrythmias:Atrial Fibrillation and Bradycardia,  [facility] CLINIC CARDIOLOGY PROGRESS NOTE       Patient ID: LAWENCE JERVIS MRN: [id number] DOB/AGE: 10/05/1941 82 y.o.  Admit date: 09/10/2023 Referring Physician Zada Girt, NP Primary Physician Malva Limes, MD  Primary Cardiologist Dr. Marcina Millard Reason for Consultation Chronic Heart Failure, EF < 20%  HPI: Taylor [name] is a 82 y.o. male  with a past medical history of paroxysmal atrial fibrillation (Eliquis), chronic HFrEF (EF < 20%), s/p CABG (08/2006), s/p dual-chamber ICD (2014), SSS, HTN, HLD, type 2 diabetes, pancreatitis, ischemic cardiomyopathy, ventricular fibrillation arrest (1994) s/p MI infarction, bilateral carotid artery stenosis s/p left carotid stent (06/2022), abdominal aortic aneurysm s/p endovascular repair (2008), CKD stage 4, COPD (baseline 2L [place]) liver cirrhosis with ascites (CT 08/2023), who presented to the ED on 09/10/2023 for frequent falls and generalized weakness. Cardiology was consulted for further evaluation.   Interval History: -Patient resting comfortably in hospital bed eating breakfast on 2L [place] (baseline), denies any chest pain or shortness of breath.  -BP and HR controlled. Remains on midodrine. Appears euvolemic. -Cr improving (3.09 > 2.42), still elevated above baseline. Good UOP.   Review of systems complete and found to be negative unless listed above    Past Medical History:  Diagnosis Date   AAA (abdominal aortic aneurysm) (HCC) 06/03/2007   [facility]; Dr. Hart [place]   AICD (automatic cardioverter/defibrillator) present    Arrhythmia    atrial fibrillation   Barrett's esophagus    Bladder cancer (HCC)    Bradycardia    CAD (coronary artery disease)    CAP (community acquired pneumonia) 11/13/2019   CHF (congestive heart failure) (HCC)    Cluster headache    COVID-19 09/27/2021   DDD (degenerative disc disease), lumbar    Diabetes mellitus without complication (HCC)    Dyspnea    WITH EXERTION   Edema     LEFT ANKLE   Fracture of skull base (HCC) 1997   due to fall   GERD (gastroesophageal reflux disease)    Gout    History of bladder cancer 12/1995   Hyperlipidemia    Hypertension    Hypocalcemia 04/19/2020   Possibly secondary to diuretics.   Low=5.9 04/19/2020   Malignant melanoma (HCC) 12/2012   right dorsal forearm excised   Myocardial infarction [facility])    LAST 2014   Osteoarthritis of knee    Other specified complication of vascular prosthetic devices, implants and grafts, initial encounter (HCC) 08/22/2021   Pacemaker 10/10/2006   Pancreatitis 11/22/2019   Pneumonia    2016   Pre-diabetes    Psoriasis    Rib fracture 1997   due to fall   Sleep apnea    CPAP   Stroke [facility])    Venous incompetence     Past Surgical History:  Procedure Laterality Date   ABDOMINAL AORTIC ANEURYSM REPAIR  06/03/2007   [facility]; Dr. Hart [place]   ANGIOPLASTY  1994   [place]   BLADDER TUMOR EXCISION  12/1995   CAROTID PTA/STENT INTERVENTION Left 07/23/2022   Procedure: CAROTID PTA/STENT INTERVENTION;  Surgeon: Renford Dills, MD;  Location: [facility] INVASIVE CV LAB;  Service: Cardiovascular;  Laterality: Left;   CATARACT EXTRACTION W/PHACO Left 10/22/2016   Procedure: CATARACT EXTRACTION PHACO AND INTRAOCULAR LENS PLACEMENT (IOC);  Surgeon: Galen Manila, MD;  Location: [facility] ORS;  Service: Ophthalmology;  Laterality: Left;  [place] 47.7 AP% 18.4 CDE 8.78 Fluid pack lot # [id number]   CATARACT EXTRACTION W/PHACO Right 12/10/2016  well perfused. No clubbing, cyanosis. Trace edema.  Neuro: Alert and oriented X 3. Psych:  Answers questions appropriately.   Labs: Basic Metabolic Panel: Recent Labs    09/15/23 0328 09/16/23 0358  NA 138 134*  K 3.5 3.6  CL 99 97*  CO2 29 29  GLUCOSE 138* 125*  BUN 87* 73*  CREATININE 3.09* 2.42*  CALCIUM 9.1 8.9   Liver Function Tests: No results for input(s): "AST", "ALT", "ALKPHOS", "BILITOT", "PROT", "ALBUMIN" in the last 72 hours.  No results for input(s): "LIPASE", "AMYLASE" in the last 72 hours. CBC: Recent Labs    09/15/23 0328 09/16/23 0358  WBC 8.5 7.5  HGB 10.4* 9.8*  HCT 33.2* 30.9*  MCV 98.5 97.2  PLT 160 154   Cardiac Enzymes: No results for input(s): "CKTOTAL", "CKMB", "CKMBINDEX", "TROPONINIHS" in the last 72 hours.  BNP: No results for input(s): "BNP" in the last 72 hours.  D-Dimer: No results for input(s): "DDIMER" in the last 72 hours. Hemoglobin A1C: No results for input(s): "HGBA1C" in the last 72 hours.  Fasting Lipid Panel: No results for input(s): "CHOL", "HDL", "LDLCALC", "TRIG", "CHOLHDL", "LDLDIRECT" in the last 72 hours.  Thyroid Function Tests: No results for input(s): "TSH", "T4TOTAL", "T3FREE", "THYROIDAB" in the last 72 hours.  Invalid input(s): "FREET3"  Anemia Panel: No results for input(s): "VITAMINB12", "FOLATE", "FERRITIN", "TIBC", "IRON", "RETICCTPCT" in the last 72 hours.   Radiology: US RENAL  Result Date: 09/11/2023 CLINICAL DATA:  Acute on chronic renal failure EXAM: RENAL / URINARY TRACT ULTRASOUND COMPLETE COMPARISON:  07/29/2023.  CT 09/10/2023 FINDINGS: Right Kidney: Renal measurements: 10.3 x 6.2 x 5.8 cm = volume: 192 mL. Echogenicity within normal limits. No mass or hydronephrosis visualized. Left Kidney: Renal measurements: 7.0 x 3.5 x 2.8 cm = volume: 37 mL. Atrophic with cortical thinning. No mass or hydronephrosis. Bladder: Decompressed with Foley catheter in place. Other: Moderate  ascites in the abdomen and pelvis. IMPRESSION: No acute findings. Atrophic left kidney, stable. Moderate ascites.  Electronically Signed   By: Charlett Nose M.D.   On: 09/11/2023 19:18   ECHOCARDIOGRAM COMPLETE  Result Date: 09/11/2023    ECHOCARDIOGRAM REPORT   Patient Name:   Taylor [name] Date of Exam: 09/11/2023 Medical Rec #:  [id number]    Height:       70.0 in Accession #:    [id number]   Weight:       158.3 lb Date of Birth:  [birth year]   BSA:          1.890 m Patient Age:    82 years     BP:           103/69 mmHg Patient Gender: M            HR:           62 bpm. Exam Location:  [facility] Procedure: 2D Echo, 3D Echo, Cardiac Doppler and Color Doppler Indications:     Syncope  History:         Patient has prior history of Echocardiogram examinations, most                  recent 10/30/2022. CHF and Cardiomyopathy, CAD and Previous                  Myocardial Infarction, Pacemaker and Defibrillator, Stroke,                  Arrythmias:Atrial Fibrillation and Bradycardia,  [facility] CLINIC CARDIOLOGY PROGRESS NOTE       Patient ID: LAWENCE JERVIS MRN: [id number] DOB/AGE: 10/05/1941 82 y.o.  Admit date: 09/10/2023 Referring Physician Zada Girt, NP Primary Physician Malva Limes, MD  Primary Cardiologist Dr. Marcina Millard Reason for Consultation Chronic Heart Failure, EF < 20%  HPI: Taylor [name] is a 82 y.o. male  with a past medical history of paroxysmal atrial fibrillation (Eliquis), chronic HFrEF (EF < 20%), s/p CABG (08/2006), s/p dual-chamber ICD (2014), SSS, HTN, HLD, type 2 diabetes, pancreatitis, ischemic cardiomyopathy, ventricular fibrillation arrest (1994) s/p MI infarction, bilateral carotid artery stenosis s/p left carotid stent (06/2022), abdominal aortic aneurysm s/p endovascular repair (2008), CKD stage 4, COPD (baseline 2L [place]) liver cirrhosis with ascites (CT 08/2023), who presented to the ED on 09/10/2023 for frequent falls and generalized weakness. Cardiology was consulted for further evaluation.   Interval History: -Patient resting comfortably in hospital bed eating breakfast on 2L [place] (baseline), denies any chest pain or shortness of breath.  -BP and HR controlled. Remains on midodrine. Appears euvolemic. -Cr improving (3.09 > 2.42), still elevated above baseline. Good UOP.   Review of systems complete and found to be negative unless listed above    Past Medical History:  Diagnosis Date   AAA (abdominal aortic aneurysm) (HCC) 06/03/2007   [facility]; Dr. Hart [place]   AICD (automatic cardioverter/defibrillator) present    Arrhythmia    atrial fibrillation   Barrett's esophagus    Bladder cancer (HCC)    Bradycardia    CAD (coronary artery disease)    CAP (community acquired pneumonia) 11/13/2019   CHF (congestive heart failure) (HCC)    Cluster headache    COVID-19 09/27/2021   DDD (degenerative disc disease), lumbar    Diabetes mellitus without complication (HCC)    Dyspnea    WITH EXERTION   Edema     LEFT ANKLE   Fracture of skull base (HCC) 1997   due to fall   GERD (gastroesophageal reflux disease)    Gout    History of bladder cancer 12/1995   Hyperlipidemia    Hypertension    Hypocalcemia 04/19/2020   Possibly secondary to diuretics.   Low=5.9 04/19/2020   Malignant melanoma (HCC) 12/2012   right dorsal forearm excised   Myocardial infarction [facility])    LAST 2014   Osteoarthritis of knee    Other specified complication of vascular prosthetic devices, implants and grafts, initial encounter (HCC) 08/22/2021   Pacemaker 10/10/2006   Pancreatitis 11/22/2019   Pneumonia    2016   Pre-diabetes    Psoriasis    Rib fracture 1997   due to fall   Sleep apnea    CPAP   Stroke [facility])    Venous incompetence     Past Surgical History:  Procedure Laterality Date   ABDOMINAL AORTIC ANEURYSM REPAIR  06/03/2007   [facility]; Dr. Hart [place]   ANGIOPLASTY  1994   [place]   BLADDER TUMOR EXCISION  12/1995   CAROTID PTA/STENT INTERVENTION Left 07/23/2022   Procedure: CAROTID PTA/STENT INTERVENTION;  Surgeon: Renford Dills, MD;  Location: [facility] INVASIVE CV LAB;  Service: Cardiovascular;  Laterality: Left;   CATARACT EXTRACTION W/PHACO Left 10/22/2016   Procedure: CATARACT EXTRACTION PHACO AND INTRAOCULAR LENS PLACEMENT (IOC);  Surgeon: Galen Manila, MD;  Location: [facility] ORS;  Service: Ophthalmology;  Laterality: Left;  [place] 47.7 AP% 18.4 CDE 8.78 Fluid pack lot # [id number]   CATARACT EXTRACTION W/PHACO Right 12/10/2016  [facility] CLINIC CARDIOLOGY PROGRESS NOTE       Patient ID: LAWENCE JERVIS MRN: [id number] DOB/AGE: 10/05/1941 82 y.o.  Admit date: 09/10/2023 Referring Physician Zada Girt, NP Primary Physician Malva Limes, MD  Primary Cardiologist Dr. Marcina Millard Reason for Consultation Chronic Heart Failure, EF < 20%  HPI: Taylor [name] is a 82 y.o. male  with a past medical history of paroxysmal atrial fibrillation (Eliquis), chronic HFrEF (EF < 20%), s/p CABG (08/2006), s/p dual-chamber ICD (2014), SSS, HTN, HLD, type 2 diabetes, pancreatitis, ischemic cardiomyopathy, ventricular fibrillation arrest (1994) s/p MI infarction, bilateral carotid artery stenosis s/p left carotid stent (06/2022), abdominal aortic aneurysm s/p endovascular repair (2008), CKD stage 4, COPD (baseline 2L [place]) liver cirrhosis with ascites (CT 08/2023), who presented to the ED on 09/10/2023 for frequent falls and generalized weakness. Cardiology was consulted for further evaluation.   Interval History: -Patient resting comfortably in hospital bed eating breakfast on 2L [place] (baseline), denies any chest pain or shortness of breath.  -BP and HR controlled. Remains on midodrine. Appears euvolemic. -Cr improving (3.09 > 2.42), still elevated above baseline. Good UOP.   Review of systems complete and found to be negative unless listed above    Past Medical History:  Diagnosis Date   AAA (abdominal aortic aneurysm) (HCC) 06/03/2007   [facility]; Dr. Hart [place]   AICD (automatic cardioverter/defibrillator) present    Arrhythmia    atrial fibrillation   Barrett's esophagus    Bladder cancer (HCC)    Bradycardia    CAD (coronary artery disease)    CAP (community acquired pneumonia) 11/13/2019   CHF (congestive heart failure) (HCC)    Cluster headache    COVID-19 09/27/2021   DDD (degenerative disc disease), lumbar    Diabetes mellitus without complication (HCC)    Dyspnea    WITH EXERTION   Edema     LEFT ANKLE   Fracture of skull base (HCC) 1997   due to fall   GERD (gastroesophageal reflux disease)    Gout    History of bladder cancer 12/1995   Hyperlipidemia    Hypertension    Hypocalcemia 04/19/2020   Possibly secondary to diuretics.   Low=5.9 04/19/2020   Malignant melanoma (HCC) 12/2012   right dorsal forearm excised   Myocardial infarction [facility])    LAST 2014   Osteoarthritis of knee    Other specified complication of vascular prosthetic devices, implants and grafts, initial encounter (HCC) 08/22/2021   Pacemaker 10/10/2006   Pancreatitis 11/22/2019   Pneumonia    2016   Pre-diabetes    Psoriasis    Rib fracture 1997   due to fall   Sleep apnea    CPAP   Stroke [facility])    Venous incompetence     Past Surgical History:  Procedure Laterality Date   ABDOMINAL AORTIC ANEURYSM REPAIR  06/03/2007   [facility]; Dr. Hart [place]   ANGIOPLASTY  1994   [place]   BLADDER TUMOR EXCISION  12/1995   CAROTID PTA/STENT INTERVENTION Left 07/23/2022   Procedure: CAROTID PTA/STENT INTERVENTION;  Surgeon: Renford Dills, MD;  Location: [facility] INVASIVE CV LAB;  Service: Cardiovascular;  Laterality: Left;   CATARACT EXTRACTION W/PHACO Left 10/22/2016   Procedure: CATARACT EXTRACTION PHACO AND INTRAOCULAR LENS PLACEMENT (IOC);  Surgeon: Galen Manila, MD;  Location: [facility] ORS;  Service: Ophthalmology;  Laterality: Left;  [place] 47.7 AP% 18.4 CDE 8.78 Fluid pack lot # [id number]   CATARACT EXTRACTION W/PHACO Right 12/10/2016  [facility] CLINIC CARDIOLOGY PROGRESS NOTE       Patient ID: LAWENCE JERVIS MRN: [id number] DOB/AGE: 10/05/1941 82 y.o.  Admit date: 09/10/2023 Referring Physician Zada Girt, NP Primary Physician Malva Limes, MD  Primary Cardiologist Dr. Marcina Millard Reason for Consultation Chronic Heart Failure, EF < 20%  HPI: Taylor [name] is a 82 y.o. male  with a past medical history of paroxysmal atrial fibrillation (Eliquis), chronic HFrEF (EF < 20%), s/p CABG (08/2006), s/p dual-chamber ICD (2014), SSS, HTN, HLD, type 2 diabetes, pancreatitis, ischemic cardiomyopathy, ventricular fibrillation arrest (1994) s/p MI infarction, bilateral carotid artery stenosis s/p left carotid stent (06/2022), abdominal aortic aneurysm s/p endovascular repair (2008), CKD stage 4, COPD (baseline 2L [place]) liver cirrhosis with ascites (CT 08/2023), who presented to the ED on 09/10/2023 for frequent falls and generalized weakness. Cardiology was consulted for further evaluation.   Interval History: -Patient resting comfortably in hospital bed eating breakfast on 2L [place] (baseline), denies any chest pain or shortness of breath.  -BP and HR controlled. Remains on midodrine. Appears euvolemic. -Cr improving (3.09 > 2.42), still elevated above baseline. Good UOP.   Review of systems complete and found to be negative unless listed above    Past Medical History:  Diagnosis Date   AAA (abdominal aortic aneurysm) (HCC) 06/03/2007   [facility]; Dr. Hart [place]   AICD (automatic cardioverter/defibrillator) present    Arrhythmia    atrial fibrillation   Barrett's esophagus    Bladder cancer (HCC)    Bradycardia    CAD (coronary artery disease)    CAP (community acquired pneumonia) 11/13/2019   CHF (congestive heart failure) (HCC)    Cluster headache    COVID-19 09/27/2021   DDD (degenerative disc disease), lumbar    Diabetes mellitus without complication (HCC)    Dyspnea    WITH EXERTION   Edema     LEFT ANKLE   Fracture of skull base (HCC) 1997   due to fall   GERD (gastroesophageal reflux disease)    Gout    History of bladder cancer 12/1995   Hyperlipidemia    Hypertension    Hypocalcemia 04/19/2020   Possibly secondary to diuretics.   Low=5.9 04/19/2020   Malignant melanoma (HCC) 12/2012   right dorsal forearm excised   Myocardial infarction [facility])    LAST 2014   Osteoarthritis of knee    Other specified complication of vascular prosthetic devices, implants and grafts, initial encounter (HCC) 08/22/2021   Pacemaker 10/10/2006   Pancreatitis 11/22/2019   Pneumonia    2016   Pre-diabetes    Psoriasis    Rib fracture 1997   due to fall   Sleep apnea    CPAP   Stroke [facility])    Venous incompetence     Past Surgical History:  Procedure Laterality Date   ABDOMINAL AORTIC ANEURYSM REPAIR  06/03/2007   [facility]; Dr. Hart Rochester   ANGIOPLASTY  1994   MI   BLADDER TUMOR EXCISION  12/1995   CAROTID PTA/STENT INTERVENTION Left 07/23/2022   Procedure: CAROTID PTA/STENT INTERVENTION;  Surgeon: Renford Dills, MD;  Location: [facility] INVASIVE CV LAB;  Service: Cardiovascular;  Laterality: Left;   CATARACT EXTRACTION W/PHACO Left 10/22/2016   Procedure: CATARACT EXTRACTION PHACO AND INTRAOCULAR LENS PLACEMENT (IOC);  Surgeon: Galen Manila, MD;  Location: [facility] ORS;  Service: Ophthalmology;  Laterality: Left;  [place] 47.7 AP% 18.4 CDE 8.78 Fluid pack lot # [id number]   CATARACT EXTRACTION W/PHACO Right 12/10/2016  [facility] CLINIC CARDIOLOGY PROGRESS NOTE       Patient ID: LAWENCE JERVIS MRN: [id number] DOB/AGE: 10/05/1941 82 y.o.  Admit date: 09/10/2023 Referring Physician Zada Girt, NP Primary Physician Malva Limes, MD  Primary Cardiologist Dr. Marcina Millard Reason for Consultation Chronic Heart Failure, EF < 20%  HPI: Taylor [name] is a 82 y.o. male  with a past medical history of paroxysmal atrial fibrillation (Eliquis), chronic HFrEF (EF < 20%), s/p CABG (08/2006), s/p dual-chamber ICD (2014), SSS, HTN, HLD, type 2 diabetes, pancreatitis, ischemic cardiomyopathy, ventricular fibrillation arrest (1994) s/p MI infarction, bilateral carotid artery stenosis s/p left carotid stent (06/2022), abdominal aortic aneurysm s/p endovascular repair (2008), CKD stage 4, COPD (baseline 2L [place]) liver cirrhosis with ascites (CT 08/2023), who presented to the ED on 09/10/2023 for frequent falls and generalized weakness. Cardiology was consulted for further evaluation.   Interval History: -Patient resting comfortably in hospital bed eating breakfast on 2L [place] (baseline), denies any chest pain or shortness of breath.  -BP and HR controlled. Remains on midodrine. Appears euvolemic. -Cr improving (3.09 > 2.42), still elevated above baseline. Good UOP.   Review of systems complete and found to be negative unless listed above    Past Medical History:  Diagnosis Date   AAA (abdominal aortic aneurysm) (HCC) 06/03/2007   [facility]; Dr. Hart Rochester   AICD (automatic cardioverter/defibrillator) present    Arrhythmia    atrial fibrillation   Barrett's esophagus    Bladder cancer (HCC)    Bradycardia    CAD (coronary artery disease)    CAP (community acquired pneumonia) 11/13/2019   CHF (congestive heart failure) (HCC)    Cluster headache    COVID-19 09/27/2021   DDD (degenerative disc disease), lumbar    Diabetes mellitus without complication (HCC)    Dyspnea    WITH EXERTION   Edema     LEFT ANKLE   Fracture of skull base (HCC) 1997   due to fall   GERD (gastroesophageal reflux disease)    Gout    History of bladder cancer 12/1995   Hyperlipidemia    Hypertension    Hypocalcemia 04/19/2020   Possibly secondary to diuretics.   Low=5.9 04/19/2020   Malignant melanoma (HCC) 12/2012   right dorsal forearm excised   Myocardial infarction [facility])    LAST 2014   Osteoarthritis of knee    Other specified complication of vascular prosthetic devices, implants and grafts, initial encounter (HCC) 08/22/2021   Pacemaker 10/10/2006   Pancreatitis 11/22/2019   Pneumonia    2016   Pre-diabetes    Psoriasis    Rib fracture 1997   due to fall   Sleep apnea    CPAP   Stroke [facility])    Venous incompetence     Past Surgical History:  Procedure Laterality Date   ABDOMINAL AORTIC ANEURYSM REPAIR  06/03/2007   [facility]; Dr. Hart Rochester   ANGIOPLASTY  1994   MI   BLADDER TUMOR EXCISION  12/1995   CAROTID PTA/STENT INTERVENTION Left 07/23/2022   Procedure: CAROTID PTA/STENT INTERVENTION;  Surgeon: Renford Dills, MD;  Location: [facility] INVASIVE CV LAB;  Service: Cardiovascular;  Laterality: Left;   CATARACT EXTRACTION W/PHACO Left 10/22/2016   Procedure: CATARACT EXTRACTION PHACO AND INTRAOCULAR LENS PLACEMENT (IOC);  Surgeon: Galen Manila, MD;  Location: [facility] ORS;  Service: Ophthalmology;  Laterality: Left;  [place] 47.7 AP% 18.4 CDE 8.78 Fluid pack lot # [id number]   CATARACT EXTRACTION W/PHACO Right 12/10/2016  [facility] CLINIC CARDIOLOGY PROGRESS NOTE       Patient ID: LAWENCE JERVIS MRN: [id number] DOB/AGE: 10/05/1941 82 y.o.  Admit date: 09/10/2023 Referring Physician Zada Girt, NP Primary Physician Malva Limes, MD  Primary Cardiologist Dr. Marcina Millard Reason for Consultation Chronic Heart Failure, EF < 20%  HPI: Taylor [name] is a 82 y.o. male  with a past medical history of paroxysmal atrial fibrillation (Eliquis), chronic HFrEF (EF < 20%), s/p CABG (08/2006), s/p dual-chamber ICD (2014), SSS, HTN, HLD, type 2 diabetes, pancreatitis, ischemic cardiomyopathy, ventricular fibrillation arrest (1994) s/p MI infarction, bilateral carotid artery stenosis s/p left carotid stent (06/2022), abdominal aortic aneurysm s/p endovascular repair (2008), CKD stage 4, COPD (baseline 2L [place]) liver cirrhosis with ascites (CT 08/2023), who presented to the ED on 09/10/2023 for frequent falls and generalized weakness. Cardiology was consulted for further evaluation.   Interval History: -Patient resting comfortably in hospital bed eating breakfast on 2L [place] (baseline), denies any chest pain or shortness of breath.  -BP and HR controlled. Remains on midodrine. Appears euvolemic. -Cr improving (3.09 > 2.42), still elevated above baseline. Good UOP.   Review of systems complete and found to be negative unless listed above    Past Medical History:  Diagnosis Date   AAA (abdominal aortic aneurysm) (HCC) 06/03/2007   [facility]; Dr. Hart Rochester   AICD (automatic cardioverter/defibrillator) present    Arrhythmia    atrial fibrillation   Barrett's esophagus    Bladder cancer (HCC)    Bradycardia    CAD (coronary artery disease)    CAP (community acquired pneumonia) 11/13/2019   CHF (congestive heart failure) (HCC)    Cluster headache    COVID-19 09/27/2021   DDD (degenerative disc disease), lumbar    Diabetes mellitus without complication (HCC)    Dyspnea    WITH EXERTION   Edema     LEFT ANKLE   Fracture of skull base (HCC) 1997   due to fall   GERD (gastroesophageal reflux disease)    Gout    History of bladder cancer 12/1995   Hyperlipidemia    Hypertension    Hypocalcemia 04/19/2020   Possibly secondary to diuretics.   Low=5.9 04/19/2020   Malignant melanoma (HCC) 12/2012   right dorsal forearm excised   Myocardial infarction [facility])    LAST 2014   Osteoarthritis of knee    Other specified complication of vascular prosthetic devices, implants and grafts, initial encounter (HCC) 08/22/2021   Pacemaker 10/10/2006   Pancreatitis 11/22/2019   Pneumonia    2016   Pre-diabetes    Psoriasis    Rib fracture 1997   due to fall   Sleep apnea    CPAP   Stroke [facility])    Venous incompetence     Past Surgical History:  Procedure Laterality Date   ABDOMINAL AORTIC ANEURYSM REPAIR  06/03/2007   [facility]; Dr. Hart Rochester   ANGIOPLASTY  1994   MI   BLADDER TUMOR EXCISION  12/1995   CAROTID PTA/STENT INTERVENTION Left 07/23/2022   Procedure: CAROTID PTA/STENT INTERVENTION;  Surgeon: Renford Dills, MD;  Location: [facility] INVASIVE CV LAB;  Service: Cardiovascular;  Laterality: Left;   CATARACT EXTRACTION W/PHACO Left 10/22/2016   Procedure: CATARACT EXTRACTION PHACO AND INTRAOCULAR LENS PLACEMENT (IOC);  Surgeon: Galen Manila, MD;  Location: [facility] ORS;  Service: Ophthalmology;  Laterality: Left;  [place] 47.7 AP% 18.4 CDE 8.78 Fluid pack lot # [id number]   CATARACT EXTRACTION W/PHACO Right 12/10/2016  [facility] CLINIC CARDIOLOGY PROGRESS NOTE       Patient ID: LAWENCE JERVIS MRN: [id number] DOB/AGE: 10/05/1941 82 y.o.  Admit date: 09/10/2023 Referring Physician Zada Girt, NP Primary Physician Malva Limes, MD  Primary Cardiologist Dr. Marcina Millard Reason for Consultation Chronic Heart Failure, EF < 20%  HPI: Taylor [name] is a 82 y.o. male  with a past medical history of paroxysmal atrial fibrillation (Eliquis), chronic HFrEF (EF < 20%), s/p CABG (08/2006), s/p dual-chamber ICD (2014), SSS, HTN, HLD, type 2 diabetes, pancreatitis, ischemic cardiomyopathy, ventricular fibrillation arrest (1994) s/p MI infarction, bilateral carotid artery stenosis s/p left carotid stent (06/2022), abdominal aortic aneurysm s/p endovascular repair (2008), CKD stage 4, COPD (baseline 2L [place]) liver cirrhosis with ascites (CT 08/2023), who presented to the ED on 09/10/2023 for frequent falls and generalized weakness. Cardiology was consulted for further evaluation.   Interval History: -Patient resting comfortably in hospital bed eating breakfast on 2L [place] (baseline), denies any chest pain or shortness of breath.  -BP and HR controlled. Remains on midodrine. Appears euvolemic. -Cr improving (3.09 > 2.42), still elevated above baseline. Good UOP.   Review of systems complete and found to be negative unless listed above    Past Medical History:  Diagnosis Date   AAA (abdominal aortic aneurysm) (HCC) 06/03/2007   [facility]; Dr. Hart Rochester   AICD (automatic cardioverter/defibrillator) present    Arrhythmia    atrial fibrillation   Barrett's esophagus    Bladder cancer (HCC)    Bradycardia    CAD (coronary artery disease)    CAP (community acquired pneumonia) 11/13/2019   CHF (congestive heart failure) (HCC)    Cluster headache    COVID-19 09/27/2021   DDD (degenerative disc disease), lumbar    Diabetes mellitus without complication (HCC)    Dyspnea    WITH EXERTION   Edema     LEFT ANKLE   Fracture of skull base (HCC) 1997   due to fall   GERD (gastroesophageal reflux disease)    Gout    History of bladder cancer 12/1995   Hyperlipidemia    Hypertension    Hypocalcemia 04/19/2020   Possibly secondary to diuretics.   Low=5.9 04/19/2020   Malignant melanoma (HCC) 12/2012   right dorsal forearm excised   Myocardial infarction [facility])    LAST 2014   Osteoarthritis of knee    Other specified complication of vascular prosthetic devices, implants and grafts, initial encounter (HCC) 08/22/2021   Pacemaker 10/10/2006   Pancreatitis 11/22/2019   Pneumonia    2016   Pre-diabetes    Psoriasis    Rib fracture 1997   due to fall   Sleep apnea    CPAP   Stroke [facility])    Venous incompetence     Past Surgical History:  Procedure Laterality Date   ABDOMINAL AORTIC ANEURYSM REPAIR  06/03/2007   [facility]; Dr. Hart Rochester   ANGIOPLASTY  1994   MI   BLADDER TUMOR EXCISION  12/1995   CAROTID PTA/STENT INTERVENTION Left 07/23/2022   Procedure: CAROTID PTA/STENT INTERVENTION;  Surgeon: Renford Dills, MD;  Location: [facility] INVASIVE CV LAB;  Service: Cardiovascular;  Laterality: Left;   CATARACT EXTRACTION W/PHACO Left 10/22/2016   Procedure: CATARACT EXTRACTION PHACO AND INTRAOCULAR LENS PLACEMENT (IOC);  Surgeon: Galen Manila, MD;  Location: [facility] ORS;  Service: Ophthalmology;  Laterality: Left;  [place] 47.7 AP% 18.4 CDE 8.78 Fluid pack lot # [id number]   CATARACT EXTRACTION W/PHACO Right 12/10/2016  [facility] CLINIC CARDIOLOGY PROGRESS NOTE       Patient ID: LAWENCE JERVIS MRN: [id number] DOB/AGE: 10/05/1941 82 y.o.  Admit date: 09/10/2023 Referring Physician Zada Girt, NP Primary Physician Malva Limes, MD  Primary Cardiologist Dr. Marcina Millard Reason for Consultation Chronic Heart Failure, EF < 20%  HPI: Taylor [name] is a 82 y.o. male  with a past medical history of paroxysmal atrial fibrillation (Eliquis), chronic HFrEF (EF < 20%), s/p CABG (08/2006), s/p dual-chamber ICD (2014), SSS, HTN, HLD, type 2 diabetes, pancreatitis, ischemic cardiomyopathy, ventricular fibrillation arrest (1994) s/p MI infarction, bilateral carotid artery stenosis s/p left carotid stent (06/2022), abdominal aortic aneurysm s/p endovascular repair (2008), CKD stage 4, COPD (baseline 2L [place]) liver cirrhosis with ascites (CT 08/2023), who presented to the ED on 09/10/2023 for frequent falls and generalized weakness. Cardiology was consulted for further evaluation.   Interval History: -Patient resting comfortably in hospital bed eating breakfast on 2L [place] (baseline), denies any chest pain or shortness of breath.  -BP and HR controlled. Remains on midodrine. Appears euvolemic. -Cr improving (3.09 > 2.42), still elevated above baseline. Good UOP.   Review of systems complete and found to be negative unless listed above    Past Medical History:  Diagnosis Date   AAA (abdominal aortic aneurysm) (HCC) 06/03/2007   [facility]; Dr. Hart [place]   AICD (automatic cardioverter/defibrillator) present    Arrhythmia    atrial fibrillation   Barrett's esophagus    Bladder cancer (HCC)    Bradycardia    CAD (coronary artery disease)    CAP (community acquired pneumonia) 11/13/2019   CHF (congestive heart failure) (HCC)    Cluster headache    COVID-19 09/27/2021   DDD (degenerative disc disease), lumbar    Diabetes mellitus without complication (HCC)    Dyspnea    WITH EXERTION   Edema     LEFT ANKLE   Fracture of skull base (HCC) 1997   due to fall   GERD (gastroesophageal reflux disease)    Gout    History of bladder cancer 12/1995   Hyperlipidemia    Hypertension    Hypocalcemia 04/19/2020   Possibly secondary to diuretics.   Low=5.9 04/19/2020   Malignant melanoma (HCC) 12/2012   right dorsal forearm excised   Myocardial infarction [facility])    LAST 2014   Osteoarthritis of knee    Other specified complication of vascular prosthetic devices, implants and grafts, initial encounter (HCC) 08/22/2021   Pacemaker 10/10/2006   Pancreatitis 11/22/2019   Pneumonia    2016   Pre-diabetes    Psoriasis    Rib fracture 1997   due to fall   Sleep apnea    CPAP   Stroke [facility])    Venous incompetence     Past Surgical History:  Procedure Laterality Date   ABDOMINAL AORTIC ANEURYSM REPAIR  06/03/2007   [facility]; Dr. Hart [place]   ANGIOPLASTY  1994   MI   BLADDER TUMOR EXCISION  12/1995   CAROTID PTA/STENT INTERVENTION Left 07/23/2022   Procedure: CAROTID PTA/STENT INTERVENTION;  Surgeon: Renford Dills, MD;  Location: [facility] INVASIVE CV LAB;  Service: Cardiovascular;  Laterality: Left;   CATARACT EXTRACTION W/PHACO Left 10/22/2016   Procedure: CATARACT EXTRACTION PHACO AND INTRAOCULAR LENS PLACEMENT (IOC);  Surgeon: Galen Manila, MD;  Location: [facility] ORS;  Service: Ophthalmology;  Laterality: Left;  [place] 47.7 AP% 18.4 CDE 8.78 Fluid pack lot # [id number]   CATARACT EXTRACTION W/PHACO Right 12/10/2016  well perfused. No clubbing, cyanosis. Trace edema.  Neuro: Alert and oriented X 3. Psych:  Answers questions appropriately.   Labs: Basic Metabolic Panel: Recent Labs    09/15/23 0328 09/16/23 0358  NA 138 134*  K 3.5 3.6  CL 99 97*  CO2 29 29  GLUCOSE 138* 125*  BUN 87* 73*  CREATININE 3.09* 2.42*  CALCIUM 9.1 8.9   Liver Function Tests: No results for input(s): "AST", "ALT", "ALKPHOS", "BILITOT", "PROT", "ALBUMIN" in the last 72 hours.  No results for input(s): "LIPASE", "AMYLASE" in the last 72 hours. CBC: Recent Labs    09/15/23 0328 09/16/23 0358  WBC 8.5 7.5  HGB 10.4* 9.8*  HCT 33.2* 30.9*  MCV 98.5 97.2  PLT 160 154   Cardiac Enzymes: No results for input(s): "CKTOTAL", "CKMB", "CKMBINDEX", "TROPONINIHS" in the last 72 hours.  BNP: No results for input(s): "BNP" in the last 72 hours.  D-Dimer: No results for input(s): "DDIMER" in the last 72 hours. Hemoglobin A1C: No results for input(s): "HGBA1C" in the last 72 hours.  Fasting Lipid Panel: No results for input(s): "CHOL", "HDL", "LDLCALC", "TRIG", "CHOLHDL", "LDLDIRECT" in the last 72 hours.  Thyroid Function Tests: No results for input(s): "TSH", "T4TOTAL", "T3FREE", "THYROIDAB" in the last 72 hours.  Invalid input(s): "FREET3"  Anemia Panel: No results for input(s): "VITAMINB12", "FOLATE", "FERRITIN", "TIBC", "IRON", "RETICCTPCT" in the last 72 hours.   Radiology: US RENAL  Result Date: 09/11/2023 CLINICAL DATA:  Acute on chronic renal failure EXAM: RENAL / URINARY TRACT ULTRASOUND COMPLETE COMPARISON:  07/29/2023.  CT 09/10/2023 FINDINGS: Right Kidney: Renal measurements: 10.3 x 6.2 x 5.8 cm = volume: 192 mL. Echogenicity within normal limits. No mass or hydronephrosis visualized. Left Kidney: Renal measurements: 7.0 x 3.5 x 2.8 cm = volume: 37 mL. Atrophic with cortical thinning. No mass or hydronephrosis. Bladder: Decompressed with Foley catheter in place. Other: Moderate  ascites in the abdomen and pelvis. IMPRESSION: No acute findings. Atrophic left kidney, stable. Moderate ascites.  Electronically Signed   By: Charlett Nose M.D.   On: 09/11/2023 19:18   ECHOCARDIOGRAM COMPLETE  Result Date: 09/11/2023    ECHOCARDIOGRAM REPORT   Patient Name:   Taylor [name] Date of Exam: 09/11/2023 Medical Rec #:  [id number]    Height:       70.0 in Accession #:    [id number]   Weight:       158.3 lb Date of Birth:  [birth year]   BSA:          1.890 m Patient Age:    82 years     BP:           103/69 mmHg Patient Gender: M            HR:           62 bpm. Exam Location:  [facility] Procedure: 2D Echo, 3D Echo, Cardiac Doppler and Color Doppler Indications:     Syncope  History:         Patient has prior history of Echocardiogram examinations, most                  recent 10/30/2022. CHF and Cardiomyopathy, CAD and Previous                  Myocardial Infarction, Pacemaker and Defibrillator, Stroke,                  Arrythmias:Atrial Fibrillation and Bradycardia,  [facility] CLINIC CARDIOLOGY PROGRESS NOTE       Patient ID: LAWENCE JERVIS MRN: [id number] DOB/AGE: 10/05/1941 82 y.o.  Admit date: 09/10/2023 Referring Physician Zada Girt, NP Primary Physician Malva Limes, MD  Primary Cardiologist Dr. Marcina Millard Reason for Consultation Chronic Heart Failure, EF < 20%  HPI: Taylor [name] is a 82 y.o. male  with a past medical history of paroxysmal atrial fibrillation (Eliquis), chronic HFrEF (EF < 20%), s/p CABG (08/2006), s/p dual-chamber ICD (2014), SSS, HTN, HLD, type 2 diabetes, pancreatitis, ischemic cardiomyopathy, ventricular fibrillation arrest (1994) s/p MI infarction, bilateral carotid artery stenosis s/p left carotid stent (06/2022), abdominal aortic aneurysm s/p endovascular repair (2008), CKD stage 4, COPD (baseline 2L [place]) liver cirrhosis with ascites (CT 08/2023), who presented to the ED on 09/10/2023 for frequent falls and generalized weakness. Cardiology was consulted for further evaluation.   Interval History: -Patient resting comfortably in hospital bed eating breakfast on 2L [place] (baseline), denies any chest pain or shortness of breath.  -BP and HR controlled. Remains on midodrine. Appears euvolemic. -Cr improving (3.09 > 2.42), still elevated above baseline. Good UOP.   Review of systems complete and found to be negative unless listed above    Past Medical History:  Diagnosis Date   AAA (abdominal aortic aneurysm) (HCC) 06/03/2007   [facility]; Dr. Hart [place]   AICD (automatic cardioverter/defibrillator) present    Arrhythmia    atrial fibrillation   Barrett's esophagus    Bladder cancer (HCC)    Bradycardia    CAD (coronary artery disease)    CAP (community acquired pneumonia) 11/13/2019   CHF (congestive heart failure) (HCC)    Cluster headache    COVID-19 09/27/2021   DDD (degenerative disc disease), lumbar    Diabetes mellitus without complication (HCC)    Dyspnea    WITH EXERTION   Edema     LEFT ANKLE   Fracture of skull base (HCC) 1997   due to fall   GERD (gastroesophageal reflux disease)    Gout    History of bladder cancer 12/1995   Hyperlipidemia    Hypertension    Hypocalcemia 04/19/2020   Possibly secondary to diuretics.   Low=5.9 04/19/2020   Malignant melanoma (HCC) 12/2012   right dorsal forearm excised   Myocardial infarction [facility])    LAST 2014   Osteoarthritis of knee    Other specified complication of vascular prosthetic devices, implants and grafts, initial encounter (HCC) 08/22/2021   Pacemaker 10/10/2006   Pancreatitis 11/22/2019   Pneumonia    2016   Pre-diabetes    Psoriasis    Rib fracture 1997   due to fall   Sleep apnea    CPAP   Stroke [facility])    Venous incompetence     Past Surgical History:  Procedure Laterality Date   ABDOMINAL AORTIC ANEURYSM REPAIR  06/03/2007   [facility]; Dr. Hart [place]   ANGIOPLASTY  1994   MI   BLADDER TUMOR EXCISION  12/1995   CAROTID PTA/STENT INTERVENTION Left 07/23/2022   Procedure: CAROTID PTA/STENT INTERVENTION;  Surgeon: Renford Dills, MD;  Location: [facility] INVASIVE CV LAB;  Service: Cardiovascular;  Laterality: Left;   CATARACT EXTRACTION W/PHACO Left 10/22/2016   Procedure: CATARACT EXTRACTION PHACO AND INTRAOCULAR LENS PLACEMENT (IOC);  Surgeon: Galen Manila, MD;  Location: [facility] ORS;  Service: Ophthalmology;  Laterality: Left;  [place] 47.7 AP% 18.4 CDE 8.78 Fluid pack lot # [id number]   CATARACT EXTRACTION W/PHACO Right 12/10/2016  well perfused. No clubbing, cyanosis. Trace edema.  Neuro: Alert and oriented X 3. Psych:  Answers questions appropriately.   Labs: Basic Metabolic Panel: Recent Labs    09/15/23 0328 09/16/23 0358  NA 138 134*  K 3.5 3.6  CL 99 97*  CO2 29 29  GLUCOSE 138* 125*  BUN 87* 73*  CREATININE 3.09* 2.42*  CALCIUM 9.1 8.9   Liver Function Tests: No results for input(s): "AST", "ALT", "ALKPHOS", "BILITOT", "PROT", "ALBUMIN" in the last 72 hours.  No results for input(s): "LIPASE", "AMYLASE" in the last 72 hours. CBC: Recent Labs    09/15/23 0328 09/16/23 0358  WBC 8.5 7.5  HGB 10.4* 9.8*  HCT 33.2* 30.9*  MCV 98.5 97.2  PLT 160 154   Cardiac Enzymes: No results for input(s): "CKTOTAL", "CKMB", "CKMBINDEX", "TROPONINIHS" in the last 72 hours.  BNP: No results for input(s): "BNP" in the last 72 hours.  D-Dimer: No results for input(s): "DDIMER" in the last 72 hours. Hemoglobin A1C: No results for input(s): "HGBA1C" in the last 72 hours.  Fasting Lipid Panel: No results for input(s): "CHOL", "HDL", "LDLCALC", "TRIG", "CHOLHDL", "LDLDIRECT" in the last 72 hours.  Thyroid Function Tests: No results for input(s): "TSH", "T4TOTAL", "T3FREE", "THYROIDAB" in the last 72 hours.  Invalid input(s): "FREET3"  Anemia Panel: No results for input(s): "VITAMINB12", "FOLATE", "FERRITIN", "TIBC", "IRON", "RETICCTPCT" in the last 72 hours.   Radiology: US RENAL  Result Date: 09/11/2023 CLINICAL DATA:  Acute on chronic renal failure EXAM: RENAL / URINARY TRACT ULTRASOUND COMPLETE COMPARISON:  07/29/2023.  CT 09/10/2023 FINDINGS: Right Kidney: Renal measurements: 10.3 x 6.2 x 5.8 cm = volume: 192 mL. Echogenicity within normal limits. No mass or hydronephrosis visualized. Left Kidney: Renal measurements: 7.0 x 3.5 x 2.8 cm = volume: 37 mL. Atrophic with cortical thinning. No mass or hydronephrosis. Bladder: Decompressed with Foley catheter in place. Other: Moderate  ascites in the abdomen and pelvis. IMPRESSION: No acute findings. Atrophic left kidney, stable. Moderate ascites.  Electronically Signed   By: Charlett Nose M.D.   On: 09/11/2023 19:18   ECHOCARDIOGRAM COMPLETE  Result Date: 09/11/2023    ECHOCARDIOGRAM REPORT   Patient Name:   Taylor [name] Date of Exam: 09/11/2023 Medical Rec #:  [id number]    Height:       70.0 in Accession #:    [id number]   Weight:       158.3 lb Date of Birth:  [birth year]   BSA:          1.890 m Patient Age:    82 years     BP:           103/69 mmHg Patient Gender: M            HR:           62 bpm. Exam Location:  [facility] Procedure: 2D Echo, 3D Echo, Cardiac Doppler and Color Doppler Indications:     Syncope  History:         Patient has prior history of Echocardiogram examinations, most                  recent 10/30/2022. CHF and Cardiomyopathy, CAD and Previous                  Myocardial Infarction, Pacemaker and Defibrillator, Stroke,                  Arrythmias:Atrial Fibrillation and Bradycardia,

## 2023-09-16 NOTE — Plan of Care (Signed)
  Problem: Education: Goal: Knowledge of cardiac device and self-care will improve Outcome: Progressing Goal: Ability to safely manage health related needs after discharge will improve Outcome: Progressing Goal: Individualized Educational Video(s) Outcome: Progressing   Problem: Cardiac: Goal: Ability to achieve and maintain adequate cardiopulmonary perfusion will improve Outcome: Progressing   Problem: Education: Goal: Ability to describe self-care measures that may prevent or decrease complications (Diabetes Survival Skills Education) will improve Outcome: Progressing Goal: Individualized Educational Video(s) Outcome: Progressing   Problem: Coping: Goal: Ability to adjust to condition or change in health will improve Outcome: Progressing   Problem: Fluid Volume: Goal: Ability to maintain a balanced intake and output will improve Outcome: Progressing   Problem: Health Behavior/Discharge Planning: Goal: Ability to identify and utilize available resources and services will improve Outcome: Progressing Goal: Ability to manage health-related needs will improve Outcome: Progressing   Problem: Metabolic: Goal: Ability to maintain appropriate glucose levels will improve Outcome: Progressing   Problem: Nutritional: Goal: Maintenance of adequate nutrition will improve Outcome: Progressing Goal: Progress toward achieving an optimal weight will improve Outcome: Progressing   Problem: Skin Integrity: Goal: Risk for impaired skin integrity will decrease Outcome: Progressing   Problem: Tissue Perfusion: Goal: Adequacy of tissue perfusion will improve Outcome: Progressing   Problem: Education: Goal: Knowledge of General Education information will improve Description: Including pain rating scale, medication(s)/side effects and non-pharmacologic comfort measures Outcome: Progressing   Problem: Health Behavior/Discharge Planning: Goal: Ability to manage health-related needs  will improve Outcome: Progressing   Problem: Clinical Measurements: Goal: Ability to maintain clinical measurements within normal limits will improve Outcome: Progressing Goal: Will remain free from infection Outcome: Progressing Goal: Diagnostic test results will improve Outcome: Progressing Goal: Respiratory complications will improve Outcome: Progressing Goal: Cardiovascular complication will be avoided Outcome: Progressing   Problem: Activity: Goal: Risk for activity intolerance will decrease Outcome: Progressing   Problem: Nutrition: Goal: Adequate nutrition will be maintained Outcome: Progressing   Problem: Coping: Goal: Level of anxiety will decrease Outcome: Progressing   Problem: Elimination: Goal: Will not experience complications related to bowel motility Outcome: Progressing Goal: Will not experience complications related to urinary retention Outcome: Progressing   Problem: Pain Managment: Goal: General experience of comfort will improve Outcome: Progressing   Problem: Safety: Goal: Ability to remain free from injury will improve Outcome: Progressing   Problem: Skin Integrity: Goal: Risk for impaired skin integrity will decrease Outcome: Progressing

## 2023-09-16 NOTE — Plan of Care (Signed)
  Problem: Coping: Goal: Ability to adjust to condition or change in health will improve Outcome: Progressing   Problem: Health Behavior/Discharge Planning: Goal: Ability to manage health-related needs will improve Outcome: Progressing   Problem: Nutritional: Goal: Maintenance of adequate nutrition will improve Outcome: Progressing

## 2023-09-16 NOTE — Progress Notes (Signed)
Palliative Care Progress Note, Assessment & Plan   Patient Name: Taylor [name]       Date: 09/16/2023 DOB: May 11, 1941  Age: 82 y.o. MRN#: [id number] Attending Physician: Sunnie Nielsen, DO Primary Care Physician: Malva Limes, MD Admit Date: 09/10/2023  Subjective: Patient is lying flat in bed in no apparent distress.  He is sleeping but easily awakens.  He acknowledges my presence and is able to make his wishes known.  His wife is at bedside during my visit.  HPI: 82 y.o. male  with past medical history of systolic CHF (EF less than 20%), CKD (stage IV), HTN, HLD, type 2 diabetes, COPD (2L supplemental oxygen), CAD with stent and history of CABG, stroke, A-fib (Eliquis), PPM with ICD, melanoma, psoriatic arthritis, pancreatitis, and an alcoholic liver cirrhosis admitted on 09/10/2023 with fall.    He is being treated for severe sepsis due to HCAP, acute renal failure superimposed on stage IV CKD, hyperkalemia, hyperphosphatemia, and hypotension.  Course of hospitalization has been complicated by chest pain relieved with nitro and heparin gtt.  Not a candidate for cardiac catheterization given his elevated creatinine.   PMT was consulted to discuss goals of care.  Summary of counseling/coordination of care: Extensive chart review completed prior to meeting patient including labs, vital signs, imaging, progress notes, orders, and available advanced directive documents from current and previous encounters.   After reviewing the patient's chart and assessing the patient at bedside, I spoke with patient and wife in regards to current medical status and boundaries of care.  I discussed patient's multiple medical problems, such as CHF with EF of less than 20%, CKD, COPD, diabetes, and A-fib.  Reviewed  that patient's functional, nutritional, and cognitive status are significant indicators of his overall prognosis.  Additionally, his chronic issues are irreversible and unable to be "cured".  Gust focusing on quality of life and using medical interventions to make sure patient is in minimal pain and suffering.  He denies discomfort at this time and says he feels well.  I again outlined that he is on a nitro and heparin drip to combat these symptoms.  When asked patient's understanding of his current medical situation, he says that he does not believe he has a problem with his heart but rather he took a fall and that is where the pain is coming from.  He says the pain he is experiencing right now is nothing like the chest pain he normally would experience if he was having a "heart problem".  Education provided on heparin and nitroglycerin gtt.'s to manage patient's chest pain.  Discussed cardiology's plan to titrate off of nitro and convert patient back to his regular BP medications.  I attempted to elicit values and goals important to patient and wife. Patient's wife is concerned with disposition planning.  I highlighted that patient will need to be medically stabilized off of drips prior to evaluating his needs for discharge.  PT and OT still need to evaluate patient for needs as well.  She is concerned that she works part-time and will not be able to provide him 24-hour nursing care.  I assured her that [facility] is following closely and will support her for  any disposition planning.  Goal remains to treat the treatable.  DNR with limited interventions remains.  Patient with minimal symptom burden at this time.  Patient and family made aware that PMT will remain available for support throughout his hospitalization.  Will monitor him peripherally and intervene where appropriate.  Attending made aware of above discussion.  Physical Exam Vitals reviewed.  Constitutional:      General: He is not in acute  distress.    Appearance: He is normal weight.  HENT:     Head: Normocephalic.     Mouth/Throat:     Mouth: Mucous membranes are moist.  Eyes:     Pupils: Pupils are equal, round, and reactive to light.  Pulmonary:     Effort: Pulmonary effort is normal.  Abdominal:     Palpations: Abdomen is soft.  Musculoskeletal:     Comments: Generalized weakness  Skin:    General: Skin is warm and dry.     Findings: Bruising present.  Neurological:     Mental Status: He is alert.  Psychiatric:        Behavior: Behavior normal.        Judgment: Judgment normal.             Total Time 35 minutes   Time spent includes: Detailed review of medical records (labs, imaging, vital signs), medically appropriate exam (mental status, respiratory, cardiac, skin), discussed with treatment team, counseling and educating patient, family and staff, documenting clinical information, medication management and coordination of care.  Samara Deist L. Bonita Quin, DNP, FNP-BC Palliative Medicine Team

## 2023-09-17 DIAGNOSIS — I4901 Ventricular fibrillation: Secondary | ICD-10-CM | POA: Diagnosis not present

## 2023-09-17 DIAGNOSIS — J811 Chronic pulmonary edema: Secondary | ICD-10-CM | POA: Diagnosis not present

## 2023-09-17 DIAGNOSIS — Z7901 Long term (current) use of anticoagulants: Secondary | ICD-10-CM | POA: Diagnosis not present

## 2023-09-17 DIAGNOSIS — M6259 Muscle wasting and atrophy, not elsewhere classified, multiple sites: Secondary | ICD-10-CM | POA: Diagnosis not present

## 2023-09-17 DIAGNOSIS — Z7401 Bed confinement status: Secondary | ICD-10-CM | POA: Diagnosis not present

## 2023-09-17 DIAGNOSIS — I5022 Chronic systolic (congestive) heart failure: Secondary | ICD-10-CM | POA: Diagnosis not present

## 2023-09-17 DIAGNOSIS — I4891 Unspecified atrial fibrillation: Secondary | ICD-10-CM | POA: Diagnosis not present

## 2023-09-17 DIAGNOSIS — R059 Cough, unspecified: Secondary | ICD-10-CM | POA: Diagnosis not present

## 2023-09-17 DIAGNOSIS — G894 Chronic pain syndrome: Secondary | ICD-10-CM | POA: Diagnosis not present

## 2023-09-17 DIAGNOSIS — N184 Chronic kidney disease, stage 4 (severe): Secondary | ICD-10-CM | POA: Diagnosis not present

## 2023-09-17 DIAGNOSIS — I2510 Atherosclerotic heart disease of native coronary artery without angina pectoris: Secondary | ICD-10-CM | POA: Diagnosis not present

## 2023-09-17 DIAGNOSIS — Z95810 Presence of automatic (implantable) cardiac defibrillator: Secondary | ICD-10-CM | POA: Diagnosis not present

## 2023-09-17 DIAGNOSIS — Z9981 Dependence on supplemental oxygen: Secondary | ICD-10-CM | POA: Diagnosis not present

## 2023-09-17 DIAGNOSIS — K7460 Unspecified cirrhosis of liver: Secondary | ICD-10-CM | POA: Diagnosis not present

## 2023-09-17 DIAGNOSIS — J90 Pleural effusion, not elsewhere classified: Secondary | ICD-10-CM | POA: Diagnosis not present

## 2023-09-17 DIAGNOSIS — N1831 Chronic kidney disease, stage 3a: Secondary | ICD-10-CM | POA: Diagnosis not present

## 2023-09-17 DIAGNOSIS — M109 Gout, unspecified: Secondary | ICD-10-CM | POA: Diagnosis not present

## 2023-09-17 DIAGNOSIS — S0990XA Unspecified injury of head, initial encounter: Secondary | ICD-10-CM

## 2023-09-17 DIAGNOSIS — I10 Essential (primary) hypertension: Secondary | ICD-10-CM | POA: Diagnosis not present

## 2023-09-17 DIAGNOSIS — E875 Hyperkalemia: Secondary | ICD-10-CM | POA: Diagnosis not present

## 2023-09-17 DIAGNOSIS — I130 Hypertensive heart and chronic kidney disease with heart failure and stage 1 through stage 4 chronic kidney disease, or unspecified chronic kidney disease: Secondary | ICD-10-CM | POA: Diagnosis not present

## 2023-09-17 DIAGNOSIS — L409 Psoriasis, unspecified: Secondary | ICD-10-CM | POA: Diagnosis not present

## 2023-09-17 DIAGNOSIS — I25790 Atherosclerosis of other coronary artery bypass graft(s) with unstable angina pectoris: Secondary | ICD-10-CM | POA: Diagnosis not present

## 2023-09-17 DIAGNOSIS — E569 Vitamin deficiency, unspecified: Secondary | ICD-10-CM | POA: Diagnosis not present

## 2023-09-17 DIAGNOSIS — K219 Gastro-esophageal reflux disease without esophagitis: Secondary | ICD-10-CM | POA: Diagnosis not present

## 2023-09-17 DIAGNOSIS — G4730 Sleep apnea, unspecified: Secondary | ICD-10-CM | POA: Diagnosis not present

## 2023-09-17 DIAGNOSIS — E1122 Type 2 diabetes mellitus with diabetic chronic kidney disease: Secondary | ICD-10-CM | POA: Diagnosis not present

## 2023-09-17 DIAGNOSIS — R2681 Unsteadiness on feet: Secondary | ICD-10-CM | POA: Diagnosis not present

## 2023-09-17 DIAGNOSIS — I480 Paroxysmal atrial fibrillation: Secondary | ICD-10-CM | POA: Diagnosis not present

## 2023-09-17 DIAGNOSIS — M79672 Pain in left foot: Secondary | ICD-10-CM | POA: Diagnosis not present

## 2023-09-17 DIAGNOSIS — R0602 Shortness of breath: Secondary | ICD-10-CM | POA: Diagnosis not present

## 2023-09-17 DIAGNOSIS — Z951 Presence of aortocoronary bypass graft: Secondary | ICD-10-CM | POA: Diagnosis not present

## 2023-09-17 DIAGNOSIS — Z7982 Long term (current) use of aspirin: Secondary | ICD-10-CM | POA: Diagnosis not present

## 2023-09-17 DIAGNOSIS — Z79899 Other long term (current) drug therapy: Secondary | ICD-10-CM | POA: Diagnosis not present

## 2023-09-17 DIAGNOSIS — G4733 Obstructive sleep apnea (adult) (pediatric): Secondary | ICD-10-CM | POA: Diagnosis not present

## 2023-09-17 DIAGNOSIS — M79671 Pain in right foot: Secondary | ICD-10-CM | POA: Diagnosis not present

## 2023-09-17 DIAGNOSIS — I5023 Acute on chronic systolic (congestive) heart failure: Secondary | ICD-10-CM | POA: Diagnosis not present

## 2023-09-17 DIAGNOSIS — J189 Pneumonia, unspecified organism: Secondary | ICD-10-CM | POA: Diagnosis not present

## 2023-09-17 DIAGNOSIS — N179 Acute kidney failure, unspecified: Secondary | ICD-10-CM | POA: Diagnosis not present

## 2023-09-17 DIAGNOSIS — Z9861 Coronary angioplasty status: Secondary | ICD-10-CM | POA: Diagnosis not present

## 2023-09-17 DIAGNOSIS — R6889 Other general symptoms and signs: Secondary | ICD-10-CM | POA: Diagnosis not present

## 2023-09-17 DIAGNOSIS — K5900 Constipation, unspecified: Secondary | ICD-10-CM | POA: Diagnosis not present

## 2023-09-17 DIAGNOSIS — I255 Ischemic cardiomyopathy: Secondary | ICD-10-CM | POA: Diagnosis not present

## 2023-09-17 DIAGNOSIS — E785 Hyperlipidemia, unspecified: Secondary | ICD-10-CM | POA: Diagnosis not present

## 2023-09-17 DIAGNOSIS — Z741 Need for assistance with personal care: Secondary | ICD-10-CM | POA: Diagnosis not present

## 2023-09-17 DIAGNOSIS — I5042 Chronic combined systolic (congestive) and diastolic (congestive) heart failure: Secondary | ICD-10-CM | POA: Diagnosis not present

## 2023-09-17 DIAGNOSIS — N189 Chronic kidney disease, unspecified: Secondary | ICD-10-CM | POA: Diagnosis not present

## 2023-09-17 DIAGNOSIS — R0789 Other chest pain: Secondary | ICD-10-CM | POA: Diagnosis not present

## 2023-09-17 LAB — COMPREHENSIVE METABOLIC PANEL
ALT: 16 U/L (ref 0–44)
AST: 41 U/L (ref 15–41)
Albumin: 2.6 g/dL — ABNORMAL LOW (ref 3.5–5.0)
Alkaline Phosphatase: 162 U/L — ABNORMAL HIGH (ref 38–126)
Anion gap: 10 (ref 5–15)
BUN: 69 mg/dL — ABNORMAL HIGH (ref 8–23)
CO2: 26 mmol/L (ref 22–32)
Calcium: 9.2 mg/dL (ref 8.9–10.3)
Chloride: 98 mmol/L (ref 98–111)
Creatinine, Ser: 2.05 mg/dL — ABNORMAL HIGH (ref 0.61–1.24)
GFR, Estimated: 32 mL/min — ABNORMAL LOW (ref >60)
Glucose, Bld: 121 mg/dL — ABNORMAL HIGH (ref 70–99)
Potassium: 3.8 mmol/L (ref 3.5–5.1)
Sodium: 134 mmol/L — ABNORMAL LOW (ref 135–145)
Total Bilirubin: 1.4 mg/dL — ABNORMAL HIGH (ref 0.3–1.2)
Total Protein: 6.9 g/dL (ref 6.5–8.1)

## 2023-09-17 LAB — CBC
HCT: 34.4 % — ABNORMAL LOW (ref 39.0–52.0)
Hemoglobin: 10.8 g/dL — ABNORMAL LOW (ref 13.0–17.0)
MCH: 30.5 pg (ref 26.0–34.0)
MCHC: 31.4 g/dL (ref 30.0–36.0)
MCV: 97.2 fL (ref 80.0–100.0)
Platelets: 138 10*3/uL — ABNORMAL LOW (ref 150–400)
RBC: 3.54 MIL/uL — ABNORMAL LOW (ref 4.22–5.81)
RDW: 17.9 % — ABNORMAL HIGH (ref 11.5–15.5)
WBC: 6 10*3/uL (ref 4.0–10.5)
nRBC: 0 % (ref 0.0–0.2)

## 2023-09-17 LAB — GLUCOSE, CAPILLARY
Glucose-Capillary: 88 mg/dL (ref 70–99)
Glucose-Capillary: 182 mg/dL — ABNORMAL HIGH (ref 70–99)
Glucose-Capillary: 110 mg/dL — ABNORMAL HIGH (ref 70–99)

## 2023-09-17 MED ORDER — METOPROLOL SUCCINATE ER 25 MG PO TB24: 25.0000 mg | ORAL_TABLET | Freq: Every day | ORAL | Status: DC

## 2023-09-17 MED ORDER — ISOSORBIDE MONONITRATE ER 30 MG PO TB24: 15.0000 mg | ORAL_TABLET | Freq: Every day | ORAL | Status: DC

## 2023-09-17 MED ORDER — ISOSORBIDE MONONITRATE ER 30 MG PO TB24
15.0000 mg | ORAL_TABLET | Freq: Every day | ORAL | Status: DC
  Administered 2023-09-17: 15 mg via ORAL
  Filled 2023-09-17: qty 1

## 2023-09-17 NOTE — Progress Notes (Signed)
[facility], [place] 09/17/23  Subjective:   Hospital day # 7 Patient with medical problems of sCHF with EF < 20%, CKD-4, HTN, HLD, DM, COPD on 2L O2, CAD with stent and hx of CABG, stroke, A fib on Eliquis, s/p of AICD, melanoma, psoriatic arthritis, pancreatitis, liver cirrhosis with ascites (shown by recent CT scan on 08/29/23)  Known to our practice from outpatient f/u of CKD  Update Patient seen sitting up in bed Partially eaten breakfast tray at bedside Patient states he feels well No complaints to offer  Renal: 10/22 0701 - 10/23 0700 In: -  Out: 795 [Urine:795] Lab Results  Component Value Date   CREATININE 2.05 (H) 09/17/2023   CREATININE 2.42 (H) 09/16/2023   CREATININE 3.09 (H) 09/15/2023     Objective:  Vital signs in last 24 hours:  Temp:  [97 F (36.1 C)-98.8 F (37.1 C)] 97 F (36.1 C) (10/23 0800) Pulse Rate:  [59-86] 61 (10/23 0800) Resp:  [16-33] 19 (10/23 0800) BP: (92-193)/(67-85) 120/74 (10/23 0800) SpO2:  [94 %-100 %] 99 % (10/23 0800) Weight:  [76.6 kg] 76.6 kg (10/23 0310)  Weight change:  Filed Weights   09/11/23 0500 09/12/23 0500 09/17/23 0310  Weight: 71.8 kg 75.8 kg 76.6 kg    Intake/Output:    Intake/Output Summary (Last 24 hours) at 09/17/2023 1325 Last data filed at 09/17/2023 1000 Gross per 24 hour  Intake 240 ml  Output 175 ml  Net 65 ml     Physical Exam: General: NAD, laying in the bed  HEENT Multiple bruises from facial fractures  Pulm/lungs Clear to auscultation, normal breathing effort  CVS/Heart Irregular rhythm  Abdomen:  Soft, nontender, nondistended  Extremities: Trace edema  Neurologic: Alert, able to follow commands  Skin: Multiple scattered ecchymosis          Basic Metabolic Panel:  Recent Labs  Lab 09/10/23 1606 09/10/23 1801 09/11/23 0330 09/11/23 1035 09/12/23 0500 09/13/23 0251 09/14/23 0313 09/15/23 0328 09/16/23 0358 09/17/23 0325  NA 135  --  132*   < >  131* 135 137 138 134* 134*  K 6.8*   < > 6.0*   < > 4.4 4.0 3.9 3.5 3.6 3.8  CL 95*  --  95*   < > 90* 91* 96* 99 97* 98  CO2 25  --  24   < > 26 27 29 29 29 26   GLUCOSE 173*  --  90   < > 142* 127* 123* 138* 125* 121*  BUN 93*  --  99*   < > 104* 108* 97* 87* 73* 69*  CREATININE 5.46*  --  4.95*   < > 5.17* 5.17* 4.10* 3.09* 2.42* 2.05*  CALCIUM 8.9  --  8.7*   < > 8.8* 8.7* 8.9 9.1 8.9 9.2  MG 2.8*  --  2.3  --  2.3 2.2  --   --   --   --   PHOS 6.5*  --  5.9*  --  7.0* 5.8*  --   --   --   --    < > = values in this interval not displayed.     CBC: Recent Labs  Lab 09/10/23 1606 09/11/23 0330 09/12/23 0500 09/15/23 0328 09/16/23 0358 09/17/23 0325  WBC 13.9* 9.7 9.9 8.5 7.5 6.0  NEUTROABS 12.0*  --   --   --   --   --   HGB 12.3* 11.0* 11.1* 10.4* 9.8* 10.8*  HCT 40.2  35.3* 35.0* 33.2* 30.9* 34.4*  MCV 101.0* 98.9 97.0 98.5 97.2 97.2  PLT 133* 129* 152 160 154 138*     No results found for: "HEPBSAG", "HEPBSAB", "HEPBIGM"    Microbiology:  Recent Results (from the past 240 hour(s))  Blood culture (routine single)     Status: None   Collection Time: 09/10/23  4:06 PM   Specimen: BLOOD  Result Value Ref Range Status   Specimen Description   Final    BLOOD LEFT ANTECUBITAL Performed at [facility], [address], [place], [place] [address]    Special Requests   Final    BOTTLES DRAWN AEROBIC AND ANAEROBIC Blood Culture adequate volume Performed at [facility], [address] [place], [place] [address]    Culture   Final    NO GROWTH 5 DAYS Performed at [facility], [address] [place], [place] [address]    Report Status 09/15/2023 FINAL  Final  Culture, blood (single)     Status: None   Collection Time: 09/10/23  5:02 PM   Specimen: BLOOD  Result Value Ref Range Status   Specimen Description BLOOD BLOOD RIGHT FOREARM  Final   Special Requests   Final    BOTTLES DRAWN AEROBIC AND ANAEROBIC Blood Culture results may not be  optimal due to an inadequate volume of blood received in culture bottles   Culture   Final    NO GROWTH 5 DAYS Performed at [facility], [address] [place], [place] [address]    Report Status 09/15/2023 FINAL  Final  SARS Coronavirus 2 by RT PCR (hospital order, performed in [facility] hospital lab) *cepheid single result test* Anterior Nasal Swab     Status: None   Collection Time: 09/10/23  9:38 PM   Specimen: Anterior Nasal Swab  Result Value Ref Range Status   SARS Coronavirus 2 by RT PCR NEGATIVE NEGATIVE Final    Comment: (NOTE) SARS-CoV-2 target nucleic acids are NOT DETECTED.  The SARS-CoV-2 RNA is generally detectable in upper and lower respiratory specimens during the acute phase of infection. The lowest concentration of SARS-CoV-2 viral copies this assay can detect is 250 copies / mL. A negative result does not preclude SARS-CoV-2 infection and should not be used as the sole basis for treatment or other patient management decisions.  A negative result may occur with improper specimen collection / handling, submission of specimen other than nasopharyngeal swab, presence of viral mutation(s) within the areas targeted by this assay, and inadequate number of viral copies (<250 copies / mL). A negative result must be combined with clinical observations, patient history, and epidemiological information.  Fact Sheet for Patients:   RoadLapTop.co.za  Fact Sheet for Healthcare Providers: http://kim-miller.com/  This test is not yet approved or  cleared by the [place] FDA and has been authorized for detection and/or diagnosis of SARS-CoV-2 by FDA under an Emergency Use Authorization (EUA).  This EUA will remain in effect (meaning this test can be used) for the duration of the COVID-19 declaration under Section 564(b)(1) of the Act, 21 U.S.C. section 360bbb-3(b)(1), unless the authorization is terminated  or revoked sooner.  Performed at [facility], [address] [place], [place] [address]   Respiratory (~20 pathogens) panel by PCR     Status: None   Collection Time: 09/10/23  9:38 PM   Specimen: Nasopharyngeal Swab; Respiratory  Result Value Ref Range Status   Adenovirus NOT DETECTED NOT DETECTED Final  Coronavirus 229E NOT DETECTED NOT DETECTED Final    Comment: (NOTE) The Coronavirus on the Respiratory Panel, DOES NOT test for the novel  Coronavirus (2019 nCoV)    Coronavirus HKU1 NOT DETECTED NOT DETECTED Final   Coronavirus NL63 NOT DETECTED NOT DETECTED Final   Coronavirus OC43 NOT DETECTED NOT DETECTED Final   Metapneumovirus NOT DETECTED NOT DETECTED Final   Rhinovirus / Enterovirus NOT DETECTED NOT DETECTED Final   Influenza A NOT DETECTED NOT DETECTED Final   Influenza B NOT DETECTED NOT DETECTED Final   Parainfluenza Virus 1 NOT DETECTED NOT DETECTED Final   Parainfluenza Virus 2 NOT DETECTED NOT DETECTED Final   Parainfluenza Virus 3 NOT DETECTED NOT DETECTED Final   Parainfluenza Virus 4 NOT DETECTED NOT DETECTED Final   Respiratory Syncytial Virus NOT DETECTED NOT DETECTED Final   Bordetella pertussis NOT DETECTED NOT DETECTED Final   Bordetella Parapertussis NOT DETECTED NOT DETECTED Final   Chlamydophila pneumoniae NOT DETECTED NOT DETECTED Final   Mycoplasma pneumoniae NOT DETECTED NOT DETECTED Final    Comment: Performed at [facility], [address] [place], [place] [address]  MRSA Next Gen by PCR, Nasal     Status: Abnormal   Collection Time: 09/10/23 11:04 PM   Specimen: Nasal Mucosa; Nasal Swab  Result Value Ref Range Status   MRSA by PCR Next Gen DETECTED (A) NOT DETECTED Final    Comment: RESULT CALLED TO, READ BACK BY AND VERIFIED WITH: MARIA CONZALES@0105  09/11/23 RH (NOTE) The GeneXpert MRSA Assay (FDA approved for NASAL specimens only), is one component of a comprehensive MRSA colonization surveillance program. It is not  intended to diagnose MRSA infection nor to guide or monitor treatment for MRSA infections. Test performance is not FDA approved in patients less than 12 years old. Performed at [facility], [address] [place], [place] [address]     Coagulation Studies: Recent Labs    09/15/23 0328  LABPROT 17.1*  INR 1.4*    Urinalysis: No results for input(s): "COLORURINE", "LABSPEC", "PHURINE", "GLUCOSEU", "HGBUR", "BILIRUBINUR", "KETONESUR", "PROTEINUR", "UROBILINOGEN", "NITRITE", "LEUKOCYTESUR" in the last 72 hours.  Invalid input(s): "APPERANCEUR"     Imaging: No results found.   Medications:       apixaban  2.5 mg Oral BID   aspirin EC  81 mg Oral Daily   Chlorhexidine Gluconate Cloth  6 each Topical QHS   feeding supplement (NEPRO CARB STEADY)  237 mL Oral TID BM   insulin aspart  0-5 Units Subcutaneous QHS   insulin aspart  0-9 Units Subcutaneous TID WC   isosorbide mononitrate  15 mg Oral Daily   metoprolol succinate  25 mg Oral Daily   multivitamin with minerals  1 tablet Oral Daily   pantoprazole  40 mg Oral Daily   sodium chloride flush  10 mL Intravenous Q12H   triamcinolone cream   Topical BID   acetaminophen, dextromethorphan-guaiFENesin, ipratropium-albuterol, ondansetron (ZOFRAN) IV  Assessment/ Plan:  82 y.o. male with sCHF with EF < 20%, CKD-4, HTN, HLD, DM, COPD on 2L O2, CAD with stent and hx of CABG, stroke, A fib on Eliquis, s/p of AICD, melanoma, psoriatic arthritis, pancreatitis, liver cirrhosis with ascites (noted on recent CT scan on 08/29/23) admitted on 09/10/2023 for Hyperkalemia [E87.5] SIRS (systemic inflammatory response syndrome) (HCC) [R65.10] AKI (acute kidney injury) (HCC) [N17.9] Injury of head, initial encounter [S09.90XA] Fall, initial encounter [W19.XXXA] Acute kidney injury superimposed on stage 4 chronic kidney disease (HCC) [N17.9, N18.4]  Acute kidney  injury on chronic kidney disease stage IV Patient followed by our  practice for chronic kidney disease. Most recent outpatient labs-creatinine 2.65/GFR 24 from 07/01/2023. AKI likely secondary to volume depletion and concurrent illness. Renal function remains improved beyond stated baseline.  Patient cleared to discharge from renal stance.  Will schedule follow-up in our office in 1 to 2 weeks.  Hyperkalemia Potassium level of 6.8 at presentation. Corrected Continue holding potassium and spironolactone. Will reassess need for diuretics outpatient.  Pneumonia/sepsis/lactic acidosis Completed antibiotics Continue supportive care.    LOS: 7 Wendee Beavers 10/23/20241:25 PM  [facility] [place], [place] [phone]

## 2023-09-17 NOTE — Progress Notes (Signed)
Palliative Care Progress Note, Assessment & Plan   Patient Name: Taylor [name]       Date: 09/17/2023 DOB: 1941/06/25  Age: 82 y.o. MRN#: [id number] Attending Physician: Loyce Dys, MD Primary Care Physician: Malva Limes, MD Admit Date: 09/10/2023  Subjective: Patient is lying in bed on his right side.  He is awake and alert.  He acknowledges my presence and is able to make his wishes known.  His wife is at bedside during my visit.  HPI: 82 y.o. male  with past medical history of systolic CHF (EF less than 20%), CKD (stage IV), HTN, HLD, type 2 diabetes, COPD (2L supplemental oxygen), CAD with stent and history of CABG, stroke, A-fib (Eliquis), PPM with ICD, melanoma, psoriatic arthritis, pancreatitis, and an alcoholic liver cirrhosis admitted on 09/10/2023 with fall.    He is being treated for severe sepsis due to HCAP, acute renal failure superimposed on stage IV CKD, hyperkalemia, hyperphosphatemia, and hypotension.  Course of hospitalization has been complicated by chest pain relieved with nitro and heparin gtt.  Not a candidate for cardiac catheterization given his elevated creatinine.   PMT was consulted to discuss goals of care.  Summary of counseling/coordination of care: Extensive chart review completed prior to meeting patient including labs, vital signs, imaging, progress notes, orders, and available advanced directive documents from current and previous encounters.   After reviewing the patient's chart and assessing the patient at bedside, I spoke with patient regards to symptom management and plan of care.  Symptoms assessed.  Patient denies chest pain, discomfort, headache, or other acute illnesses at this time.  Nitro and heparin gtt. have been removed.  Patient denies any  significant chest pain since these were discontinued.  He continues to say that his pain was not from his heart but rather from his fall and that is why it is getting better.  I again provided education that patient's ejection fraction is less than 20, that patient has a chronic, irreversible, and progressive lung disease known as COPD, and has a history of atrial fibrillation's with stroke.  All of these combined in addition to his other comorbidities contribute to an overall poor prognosis.  Patient says he feels well and is ready to get out of the hospital.  Goals are clear. DNR with limited interventions remains. Plan remains for patient to go to rehab.  Patient continues to deny that he has a significant heart condition.  Thus, goals of care discussions and review of bigger picture/prognosis is difficult. I will attempt discussion of completion of MOST once discharge plan is in place.   Patient/family have PMT contact information and were encouraged to contact PMT with any future acute palliative needs.   PMT will continue to follow and support patient throughout his hospitalization.   Physical Exam Vitals reviewed.  Constitutional:      General: He is not in acute distress.    Appearance: He is normal weight.  HENT:     Mouth/Throat:     Mouth: Mucous membranes are moist.  Eyes:     Pupils: Pupils are equal, round, and reactive to light.  Pulmonary:     Effort: Pulmonary effort is normal.  Abdominal:     Palpations: Abdomen is soft.  Musculoskeletal:     Comments: Generalized weakness  Skin:    General: Skin is warm and dry.     Findings: Bruising present.  Neurological:     Mental Status: He is alert and oriented to person, place, and time.  Psychiatric:        Mood and Affect: Mood normal.        Behavior: Behavior normal.        Thought Content: Thought content normal.        Judgment: Judgment normal.             Total Time 25 minutes   Time spent includes:  Detailed review of medical records (labs, imaging, vital signs), medically appropriate exam (mental status, respiratory, cardiac, skin), discussed with treatment team, counseling and educating patient, family and staff, documenting clinical information, medication management and coordination of care.  Samara Deist L. Bonita Quin, DNP, FNP-BC Palliative Medicine Team

## 2023-09-17 NOTE — Care Management Important Message (Signed)
Important Message  Patient Details  Name: Taylor [name] MRN: [id number] Date of Birth: August 05, 1941   Important Message Given:  Yes - Medicare IM     Truddie Hidden, RN 09/17/2023, 2:33 PM

## 2023-09-17 NOTE — Progress Notes (Signed)
Physical Therapy Treatment Patient Details Name: Taylor [name] MRN: [id number] DOB: 05-Dec-1940 Today's Date: 09/17/2023   History of Present Illness Taylor [name] is a 82 y.o. male with medical history significant of sCHF with EF < 20%, CKD-4, HTN, HLD, DM, COPD on 2L O2, CAD with stent and hx of CABG, stroke, A fib on Eliquis, s/p of AICD, melanoma, psoriatic arthritis, pancreatitis, liver cirrhosis with ascites (shown by recent CT scan on 08/29/23), who presents with fall.  Patient was seen in ED on 08/29/2023 due to fall.  Patient was found to have minimally displaced and comminuted radial styloid fracture. Pt was started on splint to left forearm.  CT of the maxillofacial images on 08/29/23 showed minimally displaced and comminuted radial styloid fracture.    PT Comments  Asked by nursing to assist with getting patient back to bed as nursing staff having difficulty with transfer. Patient reclined maximally in recliner chair on arrival to room. +2 person assistance required for repositioning hips posteriorly in chair prior to sitting chair upright. +2 person assistance for stand step transfer from bed to chair with cues for technique. Activity tolerance continues to be limited by fatigue. Patient will need rehabilitation after this hospital stay < 3 hours/day.    If plan is discharge home, recommend the following: A little help with walking and/or transfers;A little help with bathing/dressing/bathroom;Assistance with cooking/housework;Assist for transportation;Help with stairs or ramp for entrance   Can travel by private vehicle     Yes  Equipment Recommendations  None recommended by PT    Recommendations for Other Services       Precautions / Restrictions Precautions Precautions: Fall Restrictions Weight Bearing Restrictions: Yes LUE Weight Bearing: Weight bear through elbow only     Mobility  Bed Mobility Overal bed mobility: Needs Assistance Bed Mobility: Sit to Supine      Supine to sit: Min assist Sit to supine: Mod assist   General bed mobility comments: assistance for BLE. cues for technique    Transfers Overall transfer level: Needs assistance Equipment used: 2 person hand held assist Transfers: Bed to chair/wheelchair/BSC Sit to Stand: +2 physical assistance, Max assist, Min assist   Step pivot transfers: Mod assist, +2 physical assistance       General transfer comment: verbal cues for technique and for upright posture    Ambulation/Gait Ambulation/Gait assistance: Min assist, +2 safety/equipment Gait Distance (Feet): 15 Feet (28ft x 2 bouts with short seated rest break) Assistive device: Left platform walker Gait Pattern/deviations: Decreased stride length Gait velocity: decreased     General Gait Details: emphasis on using left elbow only for weight bearing using platform walker for support. activity tolerance is limited by fatigue with no chest pain reported with mobility. heart rate in the 60's-70's. Sp02 92-97% on 2 L02.   Stairs             Wheelchair Mobility     Tilt Bed    Modified Rankin (Stroke Patients Only)       Balance Overall balance assessment: Needs assistance Sitting-balance support: Feet supported Sitting balance-Leahy Scale: Good     Standing balance support: Bilateral upper extremity supported, Reliant on assistive device for balance Standing balance-Leahy Scale: Poor Standing balance comment: external support required                            Cognition Arousal: Alert Behavior During Therapy: WFL for tasks assessed/performed Overall Cognitive Status: Within Functional  Limits for tasks assessed                                          Exercises      General Comments General comments (skin integrity, edema, etc.): anterior lean with standing that improves with cues for upright posture. patient seen for second time today as nursing staff report difficulty with  getting patient back to bed      Pertinent Vitals/Pain Pain Assessment Pain Assessment: No/denies pain Faces Pain Scale: Hurts a little bit Pain Location: bilateral knee Pain Descriptors / Indicators: Discomfort Pain Intervention(s): Limited activity within patient's tolerance, Monitored during session, Repositioned    Home Living                          Prior Function            PT Goals (current goals can now be found in the care plan section) Acute Rehab PT Goals Patient Stated Goal: get back to walking PT Goal Formulation: With patient Time For Goal Achievement: 09/24/23 Potential to Achieve Goals: Fair Progress towards PT goals: Progressing toward goals    Frequency    Min 1X/week      PT Plan      Co-evaluation PT/OT/SLP Co-Evaluation/Treatment: Yes Reason for Co-Treatment: Complexity of the patient's impairments (multi-system involvement) PT goals addressed during session: Mobility/safety with mobility OT goals addressed during session: ADL's and self-care      AM-PAC PT "6 Clicks" Mobility   Outcome Measure  Help needed turning from your back to your side while in a flat bed without using bedrails?: A Little Help needed moving from lying on your back to sitting on the side of a flat bed without using bedrails?: A Little Help needed moving to and from a bed to a chair (including a wheelchair)?: A Lot Help needed standing up from a chair using your arms (e.g., wheelchair or bedside chair)?: A Lot Help needed to walk in hospital room?: A Little Help needed climbing 3-5 steps with a railing? : Total 6 Click Score: 14    End of Session Equipment Utilized During Treatment: Oxygen Activity Tolerance: Patient tolerated treatment well Patient left: in bed;with call bell/phone within reach;with bed alarm set;with family/visitor present;with nursing/sitter in room Nurse Communication: Mobility status PT Visit Diagnosis: Other abnormalities of gait  and mobility (R26.89);Repeated falls (R29.6);Unsteadiness on feet (R26.81);Difficulty in walking, not elsewhere classified (R26.2);History of falling (Z91.81)     Time: 4696-2952 PT Time Calculation (min) (ACUTE ONLY): 10 min  Charges:    $Therapeutic Activity: 8-22 mins PT General Charges $$ ACUTE PT VISIT: 1 Visit                    Donna Bernard, PT, MPT    Taylor [name] 09/17/2023, 2:00 PM

## 2023-09-17 NOTE — Discharge Summary (Signed)
mL. Atrophic with cortical thinning. No mass or hydronephrosis. Bladder: Decompressed with Foley catheter in place. Other: Moderate  ascites in the abdomen and pelvis. IMPRESSION: No acute findings. Atrophic left kidney, stable. Moderate ascites. Electronically Signed   By: Charlett Nose M.D.   On: 09/11/2023 19:18   ECHOCARDIOGRAM COMPLETE  Result Date: 09/11/2023    ECHOCARDIOGRAM REPORT   Patient Name:   Taylor [name] Date of Exam: 09/11/2023 Medical Rec #:  [id number]    Height:       70.0 in Accession #:    [id number]   Weight:       158.3 lb Date of Birth:  January 26, 1941   BSA:          1.890 m Patient Age:    81 years     BP:           103/69  mmHg Patient Gender: M            HR:           62 bpm. Exam Location:  [facility] Procedure: 2D Echo, 3D Echo, Cardiac Doppler and Color Doppler Indications:     Syncope  History:         Patient has prior history of Echocardiogram examinations, most                  recent 10/30/2022. CHF and Cardiomyopathy, CAD and Previous                  Myocardial Infarction, Pacemaker and Defibrillator, Stroke,                  Arrythmias:Atrial Fibrillation and Bradycardia,                  Signs/Symptoms:Syncope and Dyspnea; Risk Factors:Hypertension                  and Diabetes. AAA, CKD.  Sonographer:     Mikki Harbor Referring Phys:  [id number] BRITTON L RUST-CHESTER Diagnosing Phys: Rozell Searing Custovic IMPRESSIONS  1. Left ventricular ejection fraction, by estimation, is <20%. Left ventricular ejection fraction by 2D MOD biplane is 23.9 %. Left ventricular ejection fraction by PLAX is 20 %. The left ventricle has severely decreased function. The left ventricle demonstrates global hypokinesis. The left ventricular internal cavity size was moderately to severely dilated. There is mild left ventricular hypertrophy. Left ventricular diastolic parameters are consistent with Grade III diastolic dysfunction (restrictive).  2. Severe RV pressure and volume overload. RVSP falsely low due to equalization of pressures. Right ventricular systolic function is severely reduced. The right ventricular size is severely enlarged. There is normal pulmonary artery systolic pressure. The estimated right ventricular systolic pressure is 34.0 mmHg.  3. Left atrial size was severely dilated.  4. Right atrial size was severely dilated.  5. The mitral valve is degenerative. Moderate to severe mitral valve regurgitation.  6. The tricuspid valve is degenerative. Tricuspid valve regurgitation is severe.  7. The aortic valve is grossly normal. Aortic valve regurgitation is mild. Aortic valve sclerosis is present, with no evidence of aortic valve stenosis.  FINDINGS  Left Ventricle: Left ventricular ejection fraction, by estimation, is <20%. Left ventricular ejection fraction by PLAX is 20 %. Left ventricular ejection fraction by 2D MOD biplane is 23.9 %. The left ventricle has severely decreased function. The left ventricle demonstrates global hypokinesis. The left ventricular internal cavity size was moderately to severely dilated. There is mild left ventricular hypertrophy. Left ventricular diastolic  Physician Discharge Summary   Patient: Taylor [name] MRN: [id number] DOB: August 10, 1941  Admit date:     09/10/2023  Discharge date: 09/17/23  Discharge Physician: Loyce Dys   PCP: Malva Limes, MD   Recommendations at discharge:  Follow-up with nephrology and cardiology  Discharge Diagnoses: Severe sepsis due to HCAP; POA (+)MRSA screen Neg COVID and resp viral PCR  Hypothermia - resolved Acute renal failure superimposed on stage 4 chronic kidney disease Hyperkalemia and hyperphosphatemia likely as result of AKI  Hyperkalemia: resolved Hyponatremia likely d/t AKI resolved Persistent chest pain / unstable angina Chronic combined systolic and diastolic CHF (congestive heart failure)  Hypotension-resolved Hx of CAD S/P percutaneous coronary angioplasty and myocardial injury: Essential (primary) hypertension Atrial fibrillation (HCC): rate controlled/bradycardic Hypercholesteremia Chronic obstructive pulmonary disease, unspecified COPD type Chronic respiratory failure on 2L O2 [place] at home  History of CVA (cerebrovascular accident) Elevated INR and thrombocytopenia likely d/t liver disease Liver cirrhosis  Non-alcoholic Liver Cirrhosis with ascites  Hyperammonemia, mild - elevated 42. Diabetes Type 2, well controlled w/ A1C 6.6 Fall Obstructive sleep apnea Recent radial styloid fracture Knee pain suspect gout flare     Hospital course / significant events:  Taylor [name] is a 82 y.o. male past medical history significant for HFrEF (< 20%), CKD stage IV, hypertension, hyperlipidemia, diabetes, COPD on chronic home 2 L, CAD, history of atrial fibrillation on Eliquis, who presents to ED from home via EMS for fall x3. Multiple falls past month. Concern for low BP. No LOC.   10/04: visit ED due to fall - (+)minimally displaced and comminuted radial styloid fracture --> splint to left forearm. (+)fx nasal bones and L maxilla.   10/10: ENT visit, discuss repair options for  facial fx, not interested  10/16: to ED. (+)sepsis, severe - hypothermic Tmin 67F, low BP 80s/60s, AKI, hyperkalemic K max 6.8 improved w/ tx, Cr on admission 5.46 above baseline 2.7, lactic max 3.8. CT (+)PNA RML and LLL, mod R pleural effusion also noted on previous CT. Tx midodrine, steroids, fluids, vanc/cefepime, needing pressors, to ICU 10/17: BP improved, renal fxn and K improved, Echo LVEF <20%, severely decreased fxn and global hypokinesis, grade III diastolic dysfunction, severe RV overload, severe RA and LA dilation.  Cardiology consulted 10/18: stable off pressors 10/19: no improvement in renal function 10/20: some improved renal fxn, continued chest pain, agrees for DNR/DNI Worsening chest pain, requiring nitroglycerin gtt, per cardiology not candidate for intervention so will not get troponin, will cont nitroglycerin and heparin gtt for 24-48 hours.  10/21: remains on heparin and nitrog drips, palliative care following, pt/family desiring aggressive measures except CPR/resuscitation in event of arrest.  10/22: weaning off nitrog gtt per cardiology. 10/23 patient of nitro drip, cleared by cardiologist for discharge and have signed off, also renal function improved to baseline and have been cleared by nephrologist for discharge with an outpatient appointment.  Patient underwent therapy with PT OT who recommended discharge to skilled nursing facility/rehab.    Consultants: Cardiology, nephrology Procedures performed: As mentioned above Disposition: Nursing home Diet recommendation:  Cardiac diet DISCHARGE MEDICATION: Allergies as of 09/17/2023       Reactions   Amlodipine Besylate Swelling   Had a reaction when taking with colcrys    Crestor [rosuvastatin]    Muscle cramps and pain. Tolerates atorvastatin        Medication List     STOP taking these medications    carvedilol 12.5 MG tablet Commonly known as: COREG  Physician Discharge Summary   Patient: Taylor [name] MRN: [id number] DOB: August 10, 1941  Admit date:     09/10/2023  Discharge date: 09/17/23  Discharge Physician: Loyce Dys   PCP: Malva Limes, MD   Recommendations at discharge:  Follow-up with nephrology and cardiology  Discharge Diagnoses: Severe sepsis due to HCAP; POA (+)MRSA screen Neg COVID and resp viral PCR  Hypothermia - resolved Acute renal failure superimposed on stage 4 chronic kidney disease Hyperkalemia and hyperphosphatemia likely as result of AKI  Hyperkalemia: resolved Hyponatremia likely d/t AKI resolved Persistent chest pain / unstable angina Chronic combined systolic and diastolic CHF (congestive heart failure)  Hypotension-resolved Hx of CAD S/P percutaneous coronary angioplasty and myocardial injury: Essential (primary) hypertension Atrial fibrillation (HCC): rate controlled/bradycardic Hypercholesteremia Chronic obstructive pulmonary disease, unspecified COPD type Chronic respiratory failure on 2L O2 [place] at home  History of CVA (cerebrovascular accident) Elevated INR and thrombocytopenia likely d/t liver disease Liver cirrhosis  Non-alcoholic Liver Cirrhosis with ascites  Hyperammonemia, mild - elevated 42. Diabetes Type 2, well controlled w/ A1C 6.6 Fall Obstructive sleep apnea Recent radial styloid fracture Knee pain suspect gout flare     Hospital course / significant events:  Taylor [name] is a 82 y.o. male past medical history significant for HFrEF (< 20%), CKD stage IV, hypertension, hyperlipidemia, diabetes, COPD on chronic home 2 L, CAD, history of atrial fibrillation on Eliquis, who presents to ED from home via EMS for fall x3. Multiple falls past month. Concern for low BP. No LOC.   10/04: visit ED due to fall - (+)minimally displaced and comminuted radial styloid fracture --> splint to left forearm. (+)fx nasal bones and L maxilla.   10/10: ENT visit, discuss repair options for  facial fx, not interested  10/16: to ED. (+)sepsis, severe - hypothermic Tmin 67F, low BP 80s/60s, AKI, hyperkalemic K max 6.8 improved w/ tx, Cr on admission 5.46 above baseline 2.7, lactic max 3.8. CT (+)PNA RML and LLL, mod R pleural effusion also noted on previous CT. Tx midodrine, steroids, fluids, vanc/cefepime, needing pressors, to ICU 10/17: BP improved, renal fxn and K improved, Echo LVEF <20%, severely decreased fxn and global hypokinesis, grade III diastolic dysfunction, severe RV overload, severe RA and LA dilation.  Cardiology consulted 10/18: stable off pressors 10/19: no improvement in renal function 10/20: some improved renal fxn, continued chest pain, agrees for DNR/DNI Worsening chest pain, requiring nitroglycerin gtt, per cardiology not candidate for intervention so will not get troponin, will cont nitroglycerin and heparin gtt for 24-48 hours.  10/21: remains on heparin and nitrog drips, palliative care following, pt/family desiring aggressive measures except CPR/resuscitation in event of arrest.  10/22: weaning off nitrog gtt per cardiology. 10/23 patient of nitro drip, cleared by cardiologist for discharge and have signed off, also renal function improved to baseline and have been cleared by nephrologist for discharge with an outpatient appointment.  Patient underwent therapy with PT OT who recommended discharge to skilled nursing facility/rehab.    Consultants: Cardiology, nephrology Procedures performed: As mentioned above Disposition: Nursing home Diet recommendation:  Cardiac diet DISCHARGE MEDICATION: Allergies as of 09/17/2023       Reactions   Amlodipine Besylate Swelling   Had a reaction when taking with colcrys    Crestor [rosuvastatin]    Muscle cramps and pain. Tolerates atorvastatin        Medication List     STOP taking these medications    carvedilol 12.5 MG tablet Commonly known as: COREG  Physician Discharge Summary   Patient: Taylor [name] MRN: [id number] DOB: August 10, 1941  Admit date:     09/10/2023  Discharge date: 09/17/23  Discharge Physician: Loyce Dys   PCP: Malva Limes, MD   Recommendations at discharge:  Follow-up with nephrology and cardiology  Discharge Diagnoses: Severe sepsis due to HCAP; POA (+)MRSA screen Neg COVID and resp viral PCR  Hypothermia - resolved Acute renal failure superimposed on stage 4 chronic kidney disease Hyperkalemia and hyperphosphatemia likely as result of AKI  Hyperkalemia: resolved Hyponatremia likely d/t AKI resolved Persistent chest pain / unstable angina Chronic combined systolic and diastolic CHF (congestive heart failure)  Hypotension-resolved Hx of CAD S/P percutaneous coronary angioplasty and myocardial injury: Essential (primary) hypertension Atrial fibrillation (HCC): rate controlled/bradycardic Hypercholesteremia Chronic obstructive pulmonary disease, unspecified COPD type Chronic respiratory failure on 2L O2 [place] at home  History of CVA (cerebrovascular accident) Elevated INR and thrombocytopenia likely d/t liver disease Liver cirrhosis  Non-alcoholic Liver Cirrhosis with ascites  Hyperammonemia, mild - elevated 42. Diabetes Type 2, well controlled w/ A1C 6.6 Fall Obstructive sleep apnea Recent radial styloid fracture Knee pain suspect gout flare     Hospital course / significant events:  Taylor [name] is a 82 y.o. male past medical history significant for HFrEF (< 20%), CKD stage IV, hypertension, hyperlipidemia, diabetes, COPD on chronic home 2 L, CAD, history of atrial fibrillation on Eliquis, who presents to ED from home via EMS for fall x3. Multiple falls past month. Concern for low BP. No LOC.   10/04: visit ED due to fall - (+)minimally displaced and comminuted radial styloid fracture --> splint to left forearm. (+)fx nasal bones and L maxilla.   10/10: ENT visit, discuss repair options for  facial fx, not interested  10/16: to ED. (+)sepsis, severe - hypothermic Tmin 67F, low BP 80s/60s, AKI, hyperkalemic K max 6.8 improved w/ tx, Cr on admission 5.46 above baseline 2.7, lactic max 3.8. CT (+)PNA RML and LLL, mod R pleural effusion also noted on previous CT. Tx midodrine, steroids, fluids, vanc/cefepime, needing pressors, to ICU 10/17: BP improved, renal fxn and K improved, Echo LVEF <20%, severely decreased fxn and global hypokinesis, grade III diastolic dysfunction, severe RV overload, severe RA and LA dilation.  Cardiology consulted 10/18: stable off pressors 10/19: no improvement in renal function 10/20: some improved renal fxn, continued chest pain, agrees for DNR/DNI Worsening chest pain, requiring nitroglycerin gtt, per cardiology not candidate for intervention so will not get troponin, will cont nitroglycerin and heparin gtt for 24-48 hours.  10/21: remains on heparin and nitrog drips, palliative care following, pt/family desiring aggressive measures except CPR/resuscitation in event of arrest.  10/22: weaning off nitrog gtt per cardiology. 10/23 patient of nitro drip, cleared by cardiologist for discharge and have signed off, also renal function improved to baseline and have been cleared by nephrologist for discharge with an outpatient appointment.  Patient underwent therapy with PT OT who recommended discharge to skilled nursing facility/rehab.    Consultants: Cardiology, nephrology Procedures performed: As mentioned above Disposition: Nursing home Diet recommendation:  Cardiac diet DISCHARGE MEDICATION: Allergies as of 09/17/2023       Reactions   Amlodipine Besylate Swelling   Had a reaction when taking with colcrys    Crestor [rosuvastatin]    Muscle cramps and pain. Tolerates atorvastatin        Medication List     STOP taking these medications    carvedilol 12.5 MG tablet Commonly known as: COREG  mL. Atrophic with cortical thinning. No mass or hydronephrosis. Bladder: Decompressed with Foley catheter in place. Other: Moderate  ascites in the abdomen and pelvis. IMPRESSION: No acute findings. Atrophic left kidney, stable. Moderate ascites. Electronically Signed   By: Charlett Nose M.D.   On: 09/11/2023 19:18   ECHOCARDIOGRAM COMPLETE  Result Date: 09/11/2023    ECHOCARDIOGRAM REPORT   Patient Name:   Taylor [name] Date of Exam: 09/11/2023 Medical Rec #:  [id number]    Height:       70.0 in Accession #:    [id number]   Weight:       158.3 lb Date of Birth:  January 26, 1941   BSA:          1.890 m Patient Age:    81 years     BP:           103/69  mmHg Patient Gender: M            HR:           62 bpm. Exam Location:  [facility] Procedure: 2D Echo, 3D Echo, Cardiac Doppler and Color Doppler Indications:     Syncope  History:         Patient has prior history of Echocardiogram examinations, most                  recent 10/30/2022. CHF and Cardiomyopathy, CAD and Previous                  Myocardial Infarction, Pacemaker and Defibrillator, Stroke,                  Arrythmias:Atrial Fibrillation and Bradycardia,                  Signs/Symptoms:Syncope and Dyspnea; Risk Factors:Hypertension                  and Diabetes. AAA, CKD.  Sonographer:     Mikki Harbor Referring Phys:  [id number] BRITTON L RUST-CHESTER Diagnosing Phys: Rozell Searing Custovic IMPRESSIONS  1. Left ventricular ejection fraction, by estimation, is <20%. Left ventricular ejection fraction by 2D MOD biplane is 23.9 %. Left ventricular ejection fraction by PLAX is 20 %. The left ventricle has severely decreased function. The left ventricle demonstrates global hypokinesis. The left ventricular internal cavity size was moderately to severely dilated. There is mild left ventricular hypertrophy. Left ventricular diastolic parameters are consistent with Grade III diastolic dysfunction (restrictive).  2. Severe RV pressure and volume overload. RVSP falsely low due to equalization of pressures. Right ventricular systolic function is severely reduced. The right ventricular size is severely enlarged. There is normal pulmonary artery systolic pressure. The estimated right ventricular systolic pressure is 34.0 mmHg.  3. Left atrial size was severely dilated.  4. Right atrial size was severely dilated.  5. The mitral valve is degenerative. Moderate to severe mitral valve regurgitation.  6. The tricuspid valve is degenerative. Tricuspid valve regurgitation is severe.  7. The aortic valve is grossly normal. Aortic valve regurgitation is mild. Aortic valve sclerosis is present, with no evidence of aortic valve stenosis.  FINDINGS  Left Ventricle: Left ventricular ejection fraction, by estimation, is <20%. Left ventricular ejection fraction by PLAX is 20 %. Left ventricular ejection fraction by 2D MOD biplane is 23.9 %. The left ventricle has severely decreased function. The left ventricle demonstrates global hypokinesis. The left ventricular internal cavity size was moderately to severely dilated. There is mild left ventricular hypertrophy. Left ventricular diastolic  mL. Atrophic with cortical thinning. No mass or hydronephrosis. Bladder: Decompressed with Foley catheter in place. Other: Moderate  ascites in the abdomen and pelvis. IMPRESSION: No acute findings. Atrophic left kidney, stable. Moderate ascites. Electronically Signed   By: Charlett Nose M.D.   On: 09/11/2023 19:18   ECHOCARDIOGRAM COMPLETE  Result Date: 09/11/2023    ECHOCARDIOGRAM REPORT   Patient Name:   Taylor [name] Date of Exam: 09/11/2023 Medical Rec #:  [id number]    Height:       70.0 in Accession #:    [id number]   Weight:       158.3 lb Date of Birth:  January 26, 1941   BSA:          1.890 m Patient Age:    81 years     BP:           103/69  mmHg Patient Gender: M            HR:           62 bpm. Exam Location:  [facility] Procedure: 2D Echo, 3D Echo, Cardiac Doppler and Color Doppler Indications:     Syncope  History:         Patient has prior history of Echocardiogram examinations, most                  recent 10/30/2022. CHF and Cardiomyopathy, CAD and Previous                  Myocardial Infarction, Pacemaker and Defibrillator, Stroke,                  Arrythmias:Atrial Fibrillation and Bradycardia,                  Signs/Symptoms:Syncope and Dyspnea; Risk Factors:Hypertension                  and Diabetes. AAA, CKD.  Sonographer:     Mikki Harbor Referring Phys:  [id number] BRITTON L RUST-CHESTER Diagnosing Phys: Rozell Searing Custovic IMPRESSIONS  1. Left ventricular ejection fraction, by estimation, is <20%. Left ventricular ejection fraction by 2D MOD biplane is 23.9 %. Left ventricular ejection fraction by PLAX is 20 %. The left ventricle has severely decreased function. The left ventricle demonstrates global hypokinesis. The left ventricular internal cavity size was moderately to severely dilated. There is mild left ventricular hypertrophy. Left ventricular diastolic parameters are consistent with Grade III diastolic dysfunction (restrictive).  2. Severe RV pressure and volume overload. RVSP falsely low due to equalization of pressures. Right ventricular systolic function is severely reduced. The right ventricular size is severely enlarged. There is normal pulmonary artery systolic pressure. The estimated right ventricular systolic pressure is 34.0 mmHg.  3. Left atrial size was severely dilated.  4. Right atrial size was severely dilated.  5. The mitral valve is degenerative. Moderate to severe mitral valve regurgitation.  6. The tricuspid valve is degenerative. Tricuspid valve regurgitation is severe.  7. The aortic valve is grossly normal. Aortic valve regurgitation is mild. Aortic valve sclerosis is present, with no evidence of aortic valve stenosis.  FINDINGS  Left Ventricle: Left ventricular ejection fraction, by estimation, is <20%. Left ventricular ejection fraction by PLAX is 20 %. Left ventricular ejection fraction by 2D MOD biplane is 23.9 %. The left ventricle has severely decreased function. The left ventricle demonstrates global hypokinesis. The left ventricular internal cavity size was moderately to severely dilated. There is mild left ventricular hypertrophy. Left ventricular diastolic  Physician Discharge Summary   Patient: Taylor [name] MRN: [id number] DOB: August 10, 1941  Admit date:     09/10/2023  Discharge date: 09/17/23  Discharge Physician: Loyce Dys   PCP: Malva Limes, MD   Recommendations at discharge:  Follow-up with nephrology and cardiology  Discharge Diagnoses: Severe sepsis due to HCAP; POA (+)MRSA screen Neg COVID and resp viral PCR  Hypothermia - resolved Acute renal failure superimposed on stage 4 chronic kidney disease Hyperkalemia and hyperphosphatemia likely as result of AKI  Hyperkalemia: resolved Hyponatremia likely d/t AKI resolved Persistent chest pain / unstable angina Chronic combined systolic and diastolic CHF (congestive heart failure)  Hypotension-resolved Hx of CAD S/P percutaneous coronary angioplasty and myocardial injury: Essential (primary) hypertension Atrial fibrillation (HCC): rate controlled/bradycardic Hypercholesteremia Chronic obstructive pulmonary disease, unspecified COPD type Chronic respiratory failure on 2L O2 [place] at home  History of CVA (cerebrovascular accident) Elevated INR and thrombocytopenia likely d/t liver disease Liver cirrhosis  Non-alcoholic Liver Cirrhosis with ascites  Hyperammonemia, mild - elevated 42. Diabetes Type 2, well controlled w/ A1C 6.6 Fall Obstructive sleep apnea Recent radial styloid fracture Knee pain suspect gout flare     Hospital course / significant events:  Taylor [name] is a 82 y.o. male past medical history significant for HFrEF (< 20%), CKD stage IV, hypertension, hyperlipidemia, diabetes, COPD on chronic home 2 L, CAD, history of atrial fibrillation on Eliquis, who presents to ED from home via EMS for fall x3. Multiple falls past month. Concern for low BP. No LOC.   10/04: visit ED due to fall - (+)minimally displaced and comminuted radial styloid fracture --> splint to left forearm. (+)fx nasal bones and L maxilla.   10/10: ENT visit, discuss repair options for  facial fx, not interested  10/16: to ED. (+)sepsis, severe - hypothermic Tmin 67F, low BP 80s/60s, AKI, hyperkalemic K max 6.8 improved w/ tx, Cr on admission 5.46 above baseline 2.7, lactic max 3.8. CT (+)PNA RML and LLL, mod R pleural effusion also noted on previous CT. Tx midodrine, steroids, fluids, vanc/cefepime, needing pressors, to ICU 10/17: BP improved, renal fxn and K improved, Echo LVEF <20%, severely decreased fxn and global hypokinesis, grade III diastolic dysfunction, severe RV overload, severe RA and LA dilation.  Cardiology consulted 10/18: stable off pressors 10/19: no improvement in renal function 10/20: some improved renal fxn, continued chest pain, agrees for DNR/DNI Worsening chest pain, requiring nitroglycerin gtt, per cardiology not candidate for intervention so will not get troponin, will cont nitroglycerin and heparin gtt for 24-48 hours.  10/21: remains on heparin and nitrog drips, palliative care following, pt/family desiring aggressive measures except CPR/resuscitation in event of arrest.  10/22: weaning off nitrog gtt per cardiology. 10/23 patient of nitro drip, cleared by cardiologist for discharge and have signed off, also renal function improved to baseline and have been cleared by nephrologist for discharge with an outpatient appointment.  Patient underwent therapy with PT OT who recommended discharge to skilled nursing facility/rehab.    Consultants: Cardiology, nephrology Procedures performed: As mentioned above Disposition: Nursing home Diet recommendation:  Cardiac diet DISCHARGE MEDICATION: Allergies as of 09/17/2023       Reactions   Amlodipine Besylate Swelling   Had a reaction when taking with colcrys    Crestor [rosuvastatin]    Muscle cramps and pain. Tolerates atorvastatin        Medication List     STOP taking these medications    carvedilol 12.5 MG tablet Commonly known as: COREG  Physician Discharge Summary   Patient: Taylor [name] MRN: [id number] DOB: August 10, 1941  Admit date:     09/10/2023  Discharge date: 09/17/23  Discharge Physician: Loyce Dys   PCP: Malva Limes, MD   Recommendations at discharge:  Follow-up with nephrology and cardiology  Discharge Diagnoses: Severe sepsis due to HCAP; POA (+)MRSA screen Neg COVID and resp viral PCR  Hypothermia - resolved Acute renal failure superimposed on stage 4 chronic kidney disease Hyperkalemia and hyperphosphatemia likely as result of AKI  Hyperkalemia: resolved Hyponatremia likely d/t AKI resolved Persistent chest pain / unstable angina Chronic combined systolic and diastolic CHF (congestive heart failure)  Hypotension-resolved Hx of CAD S/P percutaneous coronary angioplasty and myocardial injury: Essential (primary) hypertension Atrial fibrillation (HCC): rate controlled/bradycardic Hypercholesteremia Chronic obstructive pulmonary disease, unspecified COPD type Chronic respiratory failure on 2L O2 [place] at home  History of CVA (cerebrovascular accident) Elevated INR and thrombocytopenia likely d/t liver disease Liver cirrhosis  Non-alcoholic Liver Cirrhosis with ascites  Hyperammonemia, mild - elevated 42. Diabetes Type 2, well controlled w/ A1C 6.6 Fall Obstructive sleep apnea Recent radial styloid fracture Knee pain suspect gout flare     Hospital course / significant events:  Taylor [name] is a 82 y.o. male past medical history significant for HFrEF (< 20%), CKD stage IV, hypertension, hyperlipidemia, diabetes, COPD on chronic home 2 L, CAD, history of atrial fibrillation on Eliquis, who presents to ED from home via EMS for fall x3. Multiple falls past month. Concern for low BP. No LOC.   10/04: visit ED due to fall - (+)minimally displaced and comminuted radial styloid fracture --> splint to left forearm. (+)fx nasal bones and L maxilla.   10/10: ENT visit, discuss repair options for  facial fx, not interested  10/16: to ED. (+)sepsis, severe - hypothermic Tmin 67F, low BP 80s/60s, AKI, hyperkalemic K max 6.8 improved w/ tx, Cr on admission 5.46 above baseline 2.7, lactic max 3.8. CT (+)PNA RML and LLL, mod R pleural effusion also noted on previous CT. Tx midodrine, steroids, fluids, vanc/cefepime, needing pressors, to ICU 10/17: BP improved, renal fxn and K improved, Echo LVEF <20%, severely decreased fxn and global hypokinesis, grade III diastolic dysfunction, severe RV overload, severe RA and LA dilation.  Cardiology consulted 10/18: stable off pressors 10/19: no improvement in renal function 10/20: some improved renal fxn, continued chest pain, agrees for DNR/DNI Worsening chest pain, requiring nitroglycerin gtt, per cardiology not candidate for intervention so will not get troponin, will cont nitroglycerin and heparin gtt for 24-48 hours.  10/21: remains on heparin and nitrog drips, palliative care following, pt/family desiring aggressive measures except CPR/resuscitation in event of arrest.  10/22: weaning off nitrog gtt per cardiology. 10/23 patient of nitro drip, cleared by cardiologist for discharge and have signed off, also renal function improved to baseline and have been cleared by nephrologist for discharge with an outpatient appointment.  Patient underwent therapy with PT OT who recommended discharge to skilled nursing facility/rehab.    Consultants: Cardiology, nephrology Procedures performed: As mentioned above Disposition: Nursing home Diet recommendation:  Cardiac diet DISCHARGE MEDICATION: Allergies as of 09/17/2023       Reactions   Amlodipine Besylate Swelling   Had a reaction when taking with colcrys    Crestor [rosuvastatin]    Muscle cramps and pain. Tolerates atorvastatin        Medication List     STOP taking these medications    carvedilol 12.5 MG tablet Commonly known as: COREG  mL. Atrophic with cortical thinning. No mass or hydronephrosis. Bladder: Decompressed with Foley catheter in place. Other: Moderate  ascites in the abdomen and pelvis. IMPRESSION: No acute findings. Atrophic left kidney, stable. Moderate ascites. Electronically Signed   By: Charlett Nose M.D.   On: 09/11/2023 19:18   ECHOCARDIOGRAM COMPLETE  Result Date: 09/11/2023    ECHOCARDIOGRAM REPORT   Patient Name:   Taylor [name] Date of Exam: 09/11/2023 Medical Rec #:  [id number]    Height:       70.0 in Accession #:    [id number]   Weight:       158.3 lb Date of Birth:  January 26, 1941   BSA:          1.890 m Patient Age:    81 years     BP:           103/69  mmHg Patient Gender: M            HR:           62 bpm. Exam Location:  [facility] Procedure: 2D Echo, 3D Echo, Cardiac Doppler and Color Doppler Indications:     Syncope  History:         Patient has prior history of Echocardiogram examinations, most                  recent 10/30/2022. CHF and Cardiomyopathy, CAD and Previous                  Myocardial Infarction, Pacemaker and Defibrillator, Stroke,                  Arrythmias:Atrial Fibrillation and Bradycardia,                  Signs/Symptoms:Syncope and Dyspnea; Risk Factors:Hypertension                  and Diabetes. AAA, CKD.  Sonographer:     Mikki Harbor Referring Phys:  [id number] BRITTON L RUST-CHESTER Diagnosing Phys: Rozell Searing Custovic IMPRESSIONS  1. Left ventricular ejection fraction, by estimation, is <20%. Left ventricular ejection fraction by 2D MOD biplane is 23.9 %. Left ventricular ejection fraction by PLAX is 20 %. The left ventricle has severely decreased function. The left ventricle demonstrates global hypokinesis. The left ventricular internal cavity size was moderately to severely dilated. There is mild left ventricular hypertrophy. Left ventricular diastolic parameters are consistent with Grade III diastolic dysfunction (restrictive).  2. Severe RV pressure and volume overload. RVSP falsely low due to equalization of pressures. Right ventricular systolic function is severely reduced. The right ventricular size is severely enlarged. There is normal pulmonary artery systolic pressure. The estimated right ventricular systolic pressure is 34.0 mmHg.  3. Left atrial size was severely dilated.  4. Right atrial size was severely dilated.  5. The mitral valve is degenerative. Moderate to severe mitral valve regurgitation.  6. The tricuspid valve is degenerative. Tricuspid valve regurgitation is severe.  7. The aortic valve is grossly normal. Aortic valve regurgitation is mild. Aortic valve sclerosis is present, with no evidence of aortic valve stenosis.  FINDINGS  Left Ventricle: Left ventricular ejection fraction, by estimation, is <20%. Left ventricular ejection fraction by PLAX is 20 %. Left ventricular ejection fraction by 2D MOD biplane is 23.9 %. The left ventricle has severely decreased function. The left ventricle demonstrates global hypokinesis. The left ventricular internal cavity size was moderately to severely dilated. There is mild left ventricular hypertrophy. Left ventricular diastolic  Physician Discharge Summary   Patient: Taylor [name] MRN: [id number] DOB: August 10, 1941  Admit date:     09/10/2023  Discharge date: 09/17/23  Discharge Physician: Loyce Dys   PCP: Malva Limes, MD   Recommendations at discharge:  Follow-up with nephrology and cardiology  Discharge Diagnoses: Severe sepsis due to HCAP; POA (+)MRSA screen Neg COVID and resp viral PCR  Hypothermia - resolved Acute renal failure superimposed on stage 4 chronic kidney disease Hyperkalemia and hyperphosphatemia likely as result of AKI  Hyperkalemia: resolved Hyponatremia likely d/t AKI resolved Persistent chest pain / unstable angina Chronic combined systolic and diastolic CHF (congestive heart failure)  Hypotension-resolved Hx of CAD S/P percutaneous coronary angioplasty and myocardial injury: Essential (primary) hypertension Atrial fibrillation (HCC): rate controlled/bradycardic Hypercholesteremia Chronic obstructive pulmonary disease, unspecified COPD type Chronic respiratory failure on 2L O2 [place] at home  History of CVA (cerebrovascular accident) Elevated INR and thrombocytopenia likely d/t liver disease Liver cirrhosis  Non-alcoholic Liver Cirrhosis with ascites  Hyperammonemia, mild - elevated 42. Diabetes Type 2, well controlled w/ A1C 6.6 Fall Obstructive sleep apnea Recent radial styloid fracture Knee pain suspect gout flare     Hospital course / significant events:  Taylor [name] is a 82 y.o. male past medical history significant for HFrEF (< 20%), CKD stage IV, hypertension, hyperlipidemia, diabetes, COPD on chronic home 2 L, CAD, history of atrial fibrillation on Eliquis, who presents to ED from home via EMS for fall x3. Multiple falls past month. Concern for low BP. No LOC.   10/04: visit ED due to fall - (+)minimally displaced and comminuted radial styloid fracture --> splint to left forearm. (+)fx nasal bones and L maxilla.   10/10: ENT visit, discuss repair options for  facial fx, not interested  10/16: to ED. (+)sepsis, severe - hypothermic Tmin 67F, low BP 80s/60s, AKI, hyperkalemic K max 6.8 improved w/ tx, Cr on admission 5.46 above baseline 2.7, lactic max 3.8. CT (+)PNA RML and LLL, mod R pleural effusion also noted on previous CT. Tx midodrine, steroids, fluids, vanc/cefepime, needing pressors, to ICU 10/17: BP improved, renal fxn and K improved, Echo LVEF <20%, severely decreased fxn and global hypokinesis, grade III diastolic dysfunction, severe RV overload, severe RA and LA dilation.  Cardiology consulted 10/18: stable off pressors 10/19: no improvement in renal function 10/20: some improved renal fxn, continued chest pain, agrees for DNR/DNI Worsening chest pain, requiring nitroglycerin gtt, per cardiology not candidate for intervention so will not get troponin, will cont nitroglycerin and heparin gtt for 24-48 hours.  10/21: remains on heparin and nitrog drips, palliative care following, pt/family desiring aggressive measures except CPR/resuscitation in event of arrest.  10/22: weaning off nitrog gtt per cardiology. 10/23 patient of nitro drip, cleared by cardiologist for discharge and have signed off, also renal function improved to baseline and have been cleared by nephrologist for discharge with an outpatient appointment.  Patient underwent therapy with PT OT who recommended discharge to skilled nursing facility/rehab.    Consultants: Cardiology, nephrology Procedures performed: As mentioned above Disposition: Nursing home Diet recommendation:  Cardiac diet DISCHARGE MEDICATION: Allergies as of 09/17/2023       Reactions   Amlodipine Besylate Swelling   Had a reaction when taking with colcrys    Crestor [rosuvastatin]    Muscle cramps and pain. Tolerates atorvastatin        Medication List     STOP taking these medications    carvedilol 12.5 MG tablet Commonly known as: COREG  Physician Discharge Summary   Patient: Taylor [name] MRN: [id number] DOB: August 10, 1941  Admit date:     09/10/2023  Discharge date: 09/17/23  Discharge Physician: Loyce Dys   PCP: Malva Limes, MD   Recommendations at discharge:  Follow-up with nephrology and cardiology  Discharge Diagnoses: Severe sepsis due to HCAP; POA (+)MRSA screen Neg COVID and resp viral PCR  Hypothermia - resolved Acute renal failure superimposed on stage 4 chronic kidney disease Hyperkalemia and hyperphosphatemia likely as result of AKI  Hyperkalemia: resolved Hyponatremia likely d/t AKI resolved Persistent chest pain / unstable angina Chronic combined systolic and diastolic CHF (congestive heart failure)  Hypotension-resolved Hx of CAD S/P percutaneous coronary angioplasty and myocardial injury: Essential (primary) hypertension Atrial fibrillation (HCC): rate controlled/bradycardic Hypercholesteremia Chronic obstructive pulmonary disease, unspecified COPD type Chronic respiratory failure on 2L O2 [place] at home  History of CVA (cerebrovascular accident) Elevated INR and thrombocytopenia likely d/t liver disease Liver cirrhosis  Non-alcoholic Liver Cirrhosis with ascites  Hyperammonemia, mild - elevated 42. Diabetes Type 2, well controlled w/ A1C 6.6 Fall Obstructive sleep apnea Recent radial styloid fracture Knee pain suspect gout flare     Hospital course / significant events:  Taylor [name] is a 82 y.o. male past medical history significant for HFrEF (< 20%), CKD stage IV, hypertension, hyperlipidemia, diabetes, COPD on chronic home 2 L, CAD, history of atrial fibrillation on Eliquis, who presents to ED from home via EMS for fall x3. Multiple falls past month. Concern for low BP. No LOC.   10/04: visit ED due to fall - (+)minimally displaced and comminuted radial styloid fracture --> splint to left forearm. (+)fx nasal bones and L maxilla.   10/10: ENT visit, discuss repair options for  facial fx, not interested  10/16: to ED. (+)sepsis, severe - hypothermic Tmin 67F, low BP 80s/60s, AKI, hyperkalemic K max 6.8 improved w/ tx, Cr on admission 5.46 above baseline 2.7, lactic max 3.8. CT (+)PNA RML and LLL, mod R pleural effusion also noted on previous CT. Tx midodrine, steroids, fluids, vanc/cefepime, needing pressors, to ICU 10/17: BP improved, renal fxn and K improved, Echo LVEF <20%, severely decreased fxn and global hypokinesis, grade III diastolic dysfunction, severe RV overload, severe RA and LA dilation.  Cardiology consulted 10/18: stable off pressors 10/19: no improvement in renal function 10/20: some improved renal fxn, continued chest pain, agrees for DNR/DNI Worsening chest pain, requiring nitroglycerin gtt, per cardiology not candidate for intervention so will not get troponin, will cont nitroglycerin and heparin gtt for 24-48 hours.  10/21: remains on heparin and nitrog drips, palliative care following, pt/family desiring aggressive measures except CPR/resuscitation in event of arrest.  10/22: weaning off nitrog gtt per cardiology. 10/23 patient of nitro drip, cleared by cardiologist for discharge and have signed off, also renal function improved to baseline and have been cleared by nephrologist for discharge with an outpatient appointment.  Patient underwent therapy with PT OT who recommended discharge to skilled nursing facility/rehab.    Consultants: Cardiology, nephrology Procedures performed: As mentioned above Disposition: Nursing home Diet recommendation:  Cardiac diet DISCHARGE MEDICATION: Allergies as of 09/17/2023       Reactions   Amlodipine Besylate Swelling   Had a reaction when taking with colcrys    Crestor [rosuvastatin]    Muscle cramps and pain. Tolerates atorvastatin        Medication List     STOP taking these medications    carvedilol 12.5 MG tablet Commonly known as: COREG  mL. Atrophic with cortical thinning. No mass or hydronephrosis. Bladder: Decompressed with Foley catheter in place. Other: Moderate  ascites in the abdomen and pelvis. IMPRESSION: No acute findings. Atrophic left kidney, stable. Moderate ascites. Electronically Signed   By: Charlett Nose M.D.   On: 09/11/2023 19:18   ECHOCARDIOGRAM COMPLETE  Result Date: 09/11/2023    ECHOCARDIOGRAM REPORT   Patient Name:   Taylor [name] Date of Exam: 09/11/2023 Medical Rec #:  [id number]    Height:       70.0 in Accession #:    [id number]   Weight:       158.3 lb Date of Birth:  January 26, 1941   BSA:          1.890 m Patient Age:    81 years     BP:           103/69  mmHg Patient Gender: M            HR:           62 bpm. Exam Location:  [facility] Procedure: 2D Echo, 3D Echo, Cardiac Doppler and Color Doppler Indications:     Syncope  History:         Patient has prior history of Echocardiogram examinations, most                  recent 10/30/2022. CHF and Cardiomyopathy, CAD and Previous                  Myocardial Infarction, Pacemaker and Defibrillator, Stroke,                  Arrythmias:Atrial Fibrillation and Bradycardia,                  Signs/Symptoms:Syncope and Dyspnea; Risk Factors:Hypertension                  and Diabetes. AAA, CKD.  Sonographer:     Mikki Harbor Referring Phys:  [id number] BRITTON L RUST-CHESTER Diagnosing Phys: Rozell Searing Custovic IMPRESSIONS  1. Left ventricular ejection fraction, by estimation, is <20%. Left ventricular ejection fraction by 2D MOD biplane is 23.9 %. Left ventricular ejection fraction by PLAX is 20 %. The left ventricle has severely decreased function. The left ventricle demonstrates global hypokinesis. The left ventricular internal cavity size was moderately to severely dilated. There is mild left ventricular hypertrophy. Left ventricular diastolic parameters are consistent with Grade III diastolic dysfunction (restrictive).  2. Severe RV pressure and volume overload. RVSP falsely low due to equalization of pressures. Right ventricular systolic function is severely reduced. The right ventricular size is severely enlarged. There is normal pulmonary artery systolic pressure. The estimated right ventricular systolic pressure is 34.0 mmHg.  3. Left atrial size was severely dilated.  4. Right atrial size was severely dilated.  5. The mitral valve is degenerative. Moderate to severe mitral valve regurgitation.  6. The tricuspid valve is degenerative. Tricuspid valve regurgitation is severe.  7. The aortic valve is grossly normal. Aortic valve regurgitation is mild. Aortic valve sclerosis is present, with no evidence of aortic valve stenosis.  FINDINGS  Left Ventricle: Left ventricular ejection fraction, by estimation, is <20%. Left ventricular ejection fraction by PLAX is 20 %. Left ventricular ejection fraction by 2D MOD biplane is 23.9 %. The left ventricle has severely decreased function. The left ventricle demonstrates global hypokinesis. The left ventricular internal cavity size was moderately to severely dilated. There is mild left ventricular hypertrophy. Left ventricular diastolic  mL. Atrophic with cortical thinning. No mass or hydronephrosis. Bladder: Decompressed with Foley catheter in place. Other: Moderate  ascites in the abdomen and pelvis. IMPRESSION: No acute findings. Atrophic left kidney, stable. Moderate ascites. Electronically Signed   By: Charlett Nose M.D.   On: 09/11/2023 19:18   ECHOCARDIOGRAM COMPLETE  Result Date: 09/11/2023    ECHOCARDIOGRAM REPORT   Patient Name:   Taylor [name] Date of Exam: 09/11/2023 Medical Rec #:  [id number]    Height:       70.0 in Accession #:    [id number]   Weight:       158.3 lb Date of Birth:  January 26, 1941   BSA:          1.890 m Patient Age:    81 years     BP:           103/69  mmHg Patient Gender: M            HR:           62 bpm. Exam Location:  [facility] Procedure: 2D Echo, 3D Echo, Cardiac Doppler and Color Doppler Indications:     Syncope  History:         Patient has prior history of Echocardiogram examinations, most                  recent 10/30/2022. CHF and Cardiomyopathy, CAD and Previous                  Myocardial Infarction, Pacemaker and Defibrillator, Stroke,                  Arrythmias:Atrial Fibrillation and Bradycardia,                  Signs/Symptoms:Syncope and Dyspnea; Risk Factors:Hypertension                  and Diabetes. AAA, CKD.  Sonographer:     Mikki Harbor Referring Phys:  [id number] BRITTON L RUST-CHESTER Diagnosing Phys: Rozell Searing Custovic IMPRESSIONS  1. Left ventricular ejection fraction, by estimation, is <20%. Left ventricular ejection fraction by 2D MOD biplane is 23.9 %. Left ventricular ejection fraction by PLAX is 20 %. The left ventricle has severely decreased function. The left ventricle demonstrates global hypokinesis. The left ventricular internal cavity size was moderately to severely dilated. There is mild left ventricular hypertrophy. Left ventricular diastolic parameters are consistent with Grade III diastolic dysfunction (restrictive).  2. Severe RV pressure and volume overload. RVSP falsely low due to equalization of pressures. Right ventricular systolic function is severely reduced. The right ventricular size is severely enlarged. There is normal pulmonary artery systolic pressure. The estimated right ventricular systolic pressure is 34.0 mmHg.  3. Left atrial size was severely dilated.  4. Right atrial size was severely dilated.  5. The mitral valve is degenerative. Moderate to severe mitral valve regurgitation.  6. The tricuspid valve is degenerative. Tricuspid valve regurgitation is severe.  7. The aortic valve is grossly normal. Aortic valve regurgitation is mild. Aortic valve sclerosis is present, with no evidence of aortic valve stenosis.  FINDINGS  Left Ventricle: Left ventricular ejection fraction, by estimation, is <20%. Left ventricular ejection fraction by PLAX is 20 %. Left ventricular ejection fraction by 2D MOD biplane is 23.9 %. The left ventricle has severely decreased function. The left ventricle demonstrates global hypokinesis. The left ventricular internal cavity size was moderately to severely dilated. There is mild left ventricular hypertrophy. Left ventricular diastolic  Physician Discharge Summary   Patient: Taylor [name] MRN: [id number] DOB: August 10, 1941  Admit date:     09/10/2023  Discharge date: 09/17/23  Discharge Physician: Loyce Dys   PCP: Malva Limes, MD   Recommendations at discharge:  Follow-up with nephrology and cardiology  Discharge Diagnoses: Severe sepsis due to HCAP; POA (+)MRSA screen Neg COVID and resp viral PCR  Hypothermia - resolved Acute renal failure superimposed on stage 4 chronic kidney disease Hyperkalemia and hyperphosphatemia likely as result of AKI  Hyperkalemia: resolved Hyponatremia likely d/t AKI resolved Persistent chest pain / unstable angina Chronic combined systolic and diastolic CHF (congestive heart failure)  Hypotension-resolved Hx of CAD S/P percutaneous coronary angioplasty and myocardial injury: Essential (primary) hypertension Atrial fibrillation (HCC): rate controlled/bradycardic Hypercholesteremia Chronic obstructive pulmonary disease, unspecified COPD type Chronic respiratory failure on 2L O2 [place] at home  History of CVA (cerebrovascular accident) Elevated INR and thrombocytopenia likely d/t liver disease Liver cirrhosis  Non-alcoholic Liver Cirrhosis with ascites  Hyperammonemia, mild - elevated 42. Diabetes Type 2, well controlled w/ A1C 6.6 Fall Obstructive sleep apnea Recent radial styloid fracture Knee pain suspect gout flare     Hospital course / significant events:  Taylor [name] is a 82 y.o. male past medical history significant for HFrEF (< 20%), CKD stage IV, hypertension, hyperlipidemia, diabetes, COPD on chronic home 2 L, CAD, history of atrial fibrillation on Eliquis, who presents to ED from home via EMS for fall x3. Multiple falls past month. Concern for low BP. No LOC.   10/04: visit ED due to fall - (+)minimally displaced and comminuted radial styloid fracture --> splint to left forearm. (+)fx nasal bones and L maxilla.   10/10: ENT visit, discuss repair options for  facial fx, not interested  10/16: to ED. (+)sepsis, severe - hypothermic Tmin 67F, low BP 80s/60s, AKI, hyperkalemic K max 6.8 improved w/ tx, Cr on admission 5.46 above baseline 2.7, lactic max 3.8. CT (+)PNA RML and LLL, mod R pleural effusion also noted on previous CT. Tx midodrine, steroids, fluids, vanc/cefepime, needing pressors, to ICU 10/17: BP improved, renal fxn and K improved, Echo LVEF <20%, severely decreased fxn and global hypokinesis, grade III diastolic dysfunction, severe RV overload, severe RA and LA dilation.  Cardiology consulted 10/18: stable off pressors 10/19: no improvement in renal function 10/20: some improved renal fxn, continued chest pain, agrees for DNR/DNI Worsening chest pain, requiring nitroglycerin gtt, per cardiology not candidate for intervention so will not get troponin, will cont nitroglycerin and heparin gtt for 24-48 hours.  10/21: remains on heparin and nitrog drips, palliative care following, pt/family desiring aggressive measures except CPR/resuscitation in event of arrest.  10/22: weaning off nitrog gtt per cardiology. 10/23 patient of nitro drip, cleared by cardiologist for discharge and have signed off, also renal function improved to baseline and have been cleared by nephrologist for discharge with an outpatient appointment.  Patient underwent therapy with PT OT who recommended discharge to skilled nursing facility/rehab.    Consultants: Cardiology, nephrology Procedures performed: As mentioned above Disposition: Nursing home Diet recommendation:  Cardiac diet DISCHARGE MEDICATION: Allergies as of 09/17/2023       Reactions   Amlodipine Besylate Swelling   Had a reaction when taking with colcrys    Crestor [rosuvastatin]    Muscle cramps and pain. Tolerates atorvastatin        Medication List     STOP taking these medications    carvedilol 12.5 MG tablet Commonly known as: COREG  mL. Atrophic with cortical thinning. No mass or hydronephrosis. Bladder: Decompressed with Foley catheter in place. Other: Moderate  ascites in the abdomen and pelvis. IMPRESSION: No acute findings. Atrophic left kidney, stable. Moderate ascites. Electronically Signed   By: Charlett Nose M.D.   On: 09/11/2023 19:18   ECHOCARDIOGRAM COMPLETE  Result Date: 09/11/2023    ECHOCARDIOGRAM REPORT   Patient Name:   Taylor [name] Date of Exam: 09/11/2023 Medical Rec #:  [id number]    Height:       70.0 in Accession #:    [id number]   Weight:       158.3 lb Date of Birth:  January 26, 1941   BSA:          1.890 m Patient Age:    81 years     BP:           103/69  mmHg Patient Gender: M            HR:           62 bpm. Exam Location:  [facility] Procedure: 2D Echo, 3D Echo, Cardiac Doppler and Color Doppler Indications:     Syncope  History:         Patient has prior history of Echocardiogram examinations, most                  recent 10/30/2022. CHF and Cardiomyopathy, CAD and Previous                  Myocardial Infarction, Pacemaker and Defibrillator, Stroke,                  Arrythmias:Atrial Fibrillation and Bradycardia,                  Signs/Symptoms:Syncope and Dyspnea; Risk Factors:Hypertension                  and Diabetes. AAA, CKD.  Sonographer:     Mikki Harbor Referring Phys:  [id number] BRITTON L R[place] Diagnosing Phys: Rozell Searing Custovic IMPRESSIONS  1. Left ventricular ejection fraction, by estimation, is <20%. Left ventricular ejection fraction by 2D MOD biplane is 23.9 %. Left ventricular ejection fraction by PLAX is 20 %. The left ventricle has severely decreased function. The left ventricle demonstrates global hypokinesis. The left ventricular internal cavity size was moderately to severely dilated. There is mild left ventricular hypertrophy. Left ventricular diastolic parameters are consistent with Grade III diastolic dysfunction (restrictive).  2. Severe RV pressure and volume overload. RVSP falsely low due to equalization of pressures. Right ventricular systolic function is severely reduced. The right ventricular size is severely enlarged. There is normal pulmonary artery systolic pressure. The estimated right ventricular systolic pressure is 34.0 mmHg.  3. Left atrial size was severely dilated.  4. Right atrial size was severely dilated.  5. The mitral valve is degenerative. Moderate to severe mitral valve regurgitation.  6. The tricuspid valve is degenerative. Tricuspid valve regurgitation is severe.  7. The aortic valve is grossly normal. Aortic valve regurgitation is mild. Aortic valve sclerosis is present, with no evidence of aortic valve stenosis.  FINDINGS  Left Ventricle: Left ventricular ejection fraction, by estimation, is <20%. Left ventricular ejection fraction by PLAX is 20 %. Left ventricular ejection fraction by 2D MOD biplane is 23.9 %. The left ventricle has severely decreased function. The left ventricle demonstrates global hypokinesis. The left ventricular internal cavity size was moderately to severely dilated. There is mild left ventricular hypertrophy. Left ventricular diastolic

## 2023-09-17 NOTE — Progress Notes (Signed)
Occupational Therapy Treatment Patient Details Name: Taylor [name] MRN: [id number] DOB: 03-11-1941 Today's Date: 09/17/2023   History of present illness Taylor [name] is a 82 y.o. male with medical history significant of sCHF with EF < 20%, CKD-4, HTN, HLD, DM, COPD on 2L O2, CAD with stent and hx of CABG, stroke, A fib on Eliquis, s/p of AICD, melanoma, psoriatic arthritis, pancreatitis, liver cirrhosis with ascites (shown by recent CT scan on 08/29/23), who presents with fall.  Patient was seen in ED on 08/29/2023 due to fall.  Patient was found to have minimally displaced and comminuted radial styloid fracture. Pt was started on splint to left forearm.  CT of the maxillofacial images on 08/29/23 showed minimally displaced and comminuted radial styloid fracture.   OT comments  Taylor [name] was seen for OT/PT co-treatment on this date. Upon arrival to room pt reclined in bed, agreeable to tx. Pt requires MOD A x2 + L platform RW for ADL t/f ~20 ft x2. MOD A don L wrist brace in sitting. SpO2 94% on 2L [place]. Improved cognition and command following this session. MIN cues to maintain L wrist NWBing with transfers. Pt making good progress toward goals, will continue to follow POC. Discharge recommendation remains appropriate.       If plan is discharge home, recommend the following:  Two people to help with walking and/or transfers;A lot of help with bathing/dressing/bathroom;Supervision due to cognitive status;Help with stairs or ramp for entrance   Equipment Recommendations  Other (comment) (defer)    Recommendations for Other Services      Precautions / Restrictions Precautions Precautions: Fall Restrictions Weight Bearing Restrictions: Yes LUE Weight Bearing: Weight bear through elbow only       Mobility Bed Mobility Overal bed mobility: Needs Assistance Bed Mobility: Supine to Sit     Supine to sit: Min assist          Transfers Overall transfer level: Needs assistance Equipment  used: Left platform walker Transfers: Sit to/from Stand, Bed to chair/wheelchair/BSC Sit to Stand: +2 physical assistance, Mod assist     Step pivot transfers: Min assist, +2 physical assistance     General transfer comment: cues to maintain L wrist NWBing     Balance Overall balance assessment: Needs assistance Sitting-balance support: Feet supported Sitting balance-Leahy Scale: Good     Standing balance support: Bilateral upper extremity supported, Reliant on assistive device for balance Standing balance-Leahy Scale: Poor Standing balance comment: external support required                           ADL either performed or assessed with clinical judgement   ADL Overall ADL's : Needs assistance/impaired                                       General ADL Comments: MOD A x2 + L platform RW for ADL t/f ~20 ft x2. MOD A don L wrist brace.      Cognition Arousal: Alert Behavior During Therapy: WFL for tasks assessed/performed Overall Cognitive Status: Within Functional Limits for tasks assessed                                                General  Comments SpO2 94% on 2L [place]    Pertinent Vitals/ Pain       Pain Assessment Pain Assessment: Faces Faces Pain Scale: Hurts a little bit Pain Location: bilateral knee Pain Descriptors / Indicators: Discomfort Pain Intervention(s): Limited activity within patient's tolerance   Frequency  Min 1X/week        Progress Toward Goals  OT Goals(current goals can now be found in the care plan section)  Progress towards OT goals: Progressing toward goals  Acute Rehab OT Goals Patient Stated Goal: to go home OT Goal Formulation: With patient/family Time For Goal Achievement: 09/25/23 Potential to Achieve Goals: Good ADL Goals Pt Will Perform Grooming: with modified independence;standing Pt Will Perform Lower Body Dressing: with modified independence;sit to/from stand Pt Will  Transfer to Toilet: with modified independence;ambulating;regular height toilet  Plan      Co-evaluation    PT/OT/SLP Co-Evaluation/Treatment: Yes Reason for Co-Treatment: Complexity of the patient's impairments (multi-system involvement) PT goals addressed during session: Mobility/safety with mobility OT goals addressed during session: ADL's and self-care      AM-PAC OT "6 Clicks" Daily Activity     Outcome Measure   Help from another person eating meals?: None Help from another person taking care of personal grooming?: A Little Help from another person toileting, which includes using toliet, bedpan, or urinal?: A Lot Help from another person bathing (including washing, rinsing, drying)?: A Lot Help from another person to put on and taking off regular upper body clothing?: A Little Help from another person to put on and taking off regular lower body clothing?: A Lot 6 Click Score: 16    End of Session Equipment Utilized During Treatment: Rolling walker (2 wheels)  OT Visit Diagnosis: Other abnormalities of gait and mobility (R26.89);Muscle weakness (generalized) (M62.81)   Activity Tolerance Patient tolerated treatment well   Patient Left in chair;with call bell/phone within reach;with family/visitor present   Nurse Communication          Time: 1324-4010 OT Time Calculation (min): 23 min  Charges: OT General Charges $OT Visit: 1 Visit OT Treatments $Self Care/Home Management : 8-22 mins  Kathie Dike, M.S. OTR/L  09/17/23, 11:46 AM  ascom [phone]

## 2023-09-17 NOTE — Progress Notes (Signed)
R (chronic).  Neuro: Alert and oriented X 3. Psych: Answers questions appropriately.   Labs: Basic Metabolic Panel: Recent Labs     09/16/23 0358 09/17/23 0325  NA 134* 134*  K 3.6 3.8  CL 97* 98  CO2 29 26  GLUCOSE 125* 121*  BUN 73* 69*  CREATININE 2.42* 2.05*  CALCIUM 8.9 9.2   Liver Function Tests: Recent Labs    09/17/23 0325  AST 41  ALT 16  ALKPHOS 162*  BILITOT 1.4*  PROT 6.9  ALBUMIN 2.6*    No results for input(s): "LIPASE", "AMYLASE" in the last 72 hours. CBC: Recent Labs    09/16/23 0358 09/17/23 0325  WBC 7.5 6.0  HGB 9.8* 10.8*  HCT 30.9* 34.4*  MCV 97.2 97.2  PLT 154 138*   Cardiac Enzymes: No results for input(s): "CKTOTAL", "CKMB", "CKMBINDEX", "TROPONINIHS" in the last 72 hours.  BNP: No results for input(s): "BNP" in the last 72 hours.  D-Dimer: No results for input(s): "DDIMER" in the last 72 hours. Hemoglobin A1C: No results for input(s): "HGBA1C" in the last 72 hours.  Fasting Lipid Panel: No results for input(s): "CHOL", "HDL", "LDLCALC", "TRIG", "CHOLHDL", "LDLDIRECT" in the last 72 hours.  Thyroid Function Tests: No results for input(s): "TSH", "T4TOTAL", "T3FREE", "THYROIDAB" in the last 72 hours.  Invalid input(s): "FREET3"  Anemia Panel: No results for input(s): "VITAMINB12", "FOLATE", "FERRITIN", "TIBC", "IRON", "RETICCTPCT" in the last 72 hours.   Radiology: US RENAL  Result Date: 09/11/2023 CLINICAL DATA:  Acute on chronic renal failure EXAM: RENAL / URINARY TRACT ULTRASOUND COMPLETE COMPARISON:  07/29/2023.  CT 09/10/2023 FINDINGS: Right Kidney: Renal measurements: 10.3 x 6.2 x 5.8 cm = volume: 192 mL. Echogenicity within normal limits. No mass or hydronephrosis visualized. Left Kidney: Renal measurements: 7.0 x 3.5 x 2.8 cm = volume: 37 mL. Atrophic with cortical thinning. No mass or hydronephrosis. Bladder: Decompressed with Foley catheter in place. Other: Moderate  ascites in the abdomen and pelvis. IMPRESSION: No acute findings. Atrophic left kidney, stable. Moderate ascites. Electronically Signed   By: Charlett Nose M.D.   On: 09/11/2023 19:18    ECHOCARDIOGRAM COMPLETE  Result Date: 09/11/2023    ECHOCARDIOGRAM REPORT   Patient Name:   Taylor [name] Date of Exam: 09/11/2023 Medical Rec #:  [id number]    Height:       70.0 in Accession #:    [id number]   Weight:       158.3 lb Date of Birth:  1941-03-06   BSA:          1.890 m Patient Age:    81 years     BP:           103/69 mmHg Patient Gender: M            HR:           62 bpm. Exam Location:  [facility] Procedure: 2D Echo, 3D Echo, Cardiac Doppler and Color Doppler Indications:     Syncope  History:         Patient has prior history of Echocardiogram examinations, most                  recent 10/30/2022. CHF and Cardiomyopathy, CAD and Previous                  Myocardial Infarction, Pacemaker and Defibrillator, Stroke,                  Arrythmias:Atrial Fibrillation and Bradycardia,  R (chronic).  Neuro: Alert and oriented X 3. Psych: Answers questions appropriately.   Labs: Basic Metabolic Panel: Recent Labs     09/16/23 0358 09/17/23 0325  NA 134* 134*  K 3.6 3.8  CL 97* 98  CO2 29 26  GLUCOSE 125* 121*  BUN 73* 69*  CREATININE 2.42* 2.05*  CALCIUM 8.9 9.2   Liver Function Tests: Recent Labs    09/17/23 0325  AST 41  ALT 16  ALKPHOS 162*  BILITOT 1.4*  PROT 6.9  ALBUMIN 2.6*    No results for input(s): "LIPASE", "AMYLASE" in the last 72 hours. CBC: Recent Labs    09/16/23 0358 09/17/23 0325  WBC 7.5 6.0  HGB 9.8* 10.8*  HCT 30.9* 34.4*  MCV 97.2 97.2  PLT 154 138*   Cardiac Enzymes: No results for input(s): "CKTOTAL", "CKMB", "CKMBINDEX", "TROPONINIHS" in the last 72 hours.  BNP: No results for input(s): "BNP" in the last 72 hours.  D-Dimer: No results for input(s): "DDIMER" in the last 72 hours. Hemoglobin A1C: No results for input(s): "HGBA1C" in the last 72 hours.  Fasting Lipid Panel: No results for input(s): "CHOL", "HDL", "LDLCALC", "TRIG", "CHOLHDL", "LDLDIRECT" in the last 72 hours.  Thyroid Function Tests: No results for input(s): "TSH", "T4TOTAL", "T3FREE", "THYROIDAB" in the last 72 hours.  Invalid input(s): "FREET3"  Anemia Panel: No results for input(s): "VITAMINB12", "FOLATE", "FERRITIN", "TIBC", "IRON", "RETICCTPCT" in the last 72 hours.   Radiology: US RENAL  Result Date: 09/11/2023 CLINICAL DATA:  Acute on chronic renal failure EXAM: RENAL / URINARY TRACT ULTRASOUND COMPLETE COMPARISON:  07/29/2023.  CT 09/10/2023 FINDINGS: Right Kidney: Renal measurements: 10.3 x 6.2 x 5.8 cm = volume: 192 mL. Echogenicity within normal limits. No mass or hydronephrosis visualized. Left Kidney: Renal measurements: 7.0 x 3.5 x 2.8 cm = volume: 37 mL. Atrophic with cortical thinning. No mass or hydronephrosis. Bladder: Decompressed with Foley catheter in place. Other: Moderate  ascites in the abdomen and pelvis. IMPRESSION: No acute findings. Atrophic left kidney, stable. Moderate ascites. Electronically Signed   By: Charlett Nose M.D.   On: 09/11/2023 19:18    ECHOCARDIOGRAM COMPLETE  Result Date: 09/11/2023    ECHOCARDIOGRAM REPORT   Patient Name:   Taylor [name] Date of Exam: 09/11/2023 Medical Rec #:  [id number]    Height:       70.0 in Accession #:    [id number]   Weight:       158.3 lb Date of Birth:  1941-03-06   BSA:          1.890 m Patient Age:    81 years     BP:           103/69 mmHg Patient Gender: M            HR:           62 bpm. Exam Location:  [facility] Procedure: 2D Echo, 3D Echo, Cardiac Doppler and Color Doppler Indications:     Syncope  History:         Patient has prior history of Echocardiogram examinations, most                  recent 10/30/2022. CHF and Cardiomyopathy, CAD and Previous                  Myocardial Infarction, Pacemaker and Defibrillator, Stroke,                  Arrythmias:Atrial Fibrillation and Bradycardia,  R (chronic).  Neuro: Alert and oriented X 3. Psych: Answers questions appropriately.   Labs: Basic Metabolic Panel: Recent Labs     09/16/23 0358 09/17/23 0325  NA 134* 134*  K 3.6 3.8  CL 97* 98  CO2 29 26  GLUCOSE 125* 121*  BUN 73* 69*  CREATININE 2.42* 2.05*  CALCIUM 8.9 9.2   Liver Function Tests: Recent Labs    09/17/23 0325  AST 41  ALT 16  ALKPHOS 162*  BILITOT 1.4*  PROT 6.9  ALBUMIN 2.6*    No results for input(s): "LIPASE", "AMYLASE" in the last 72 hours. CBC: Recent Labs    09/16/23 0358 09/17/23 0325  WBC 7.5 6.0  HGB 9.8* 10.8*  HCT 30.9* 34.4*  MCV 97.2 97.2  PLT 154 138*   Cardiac Enzymes: No results for input(s): "CKTOTAL", "CKMB", "CKMBINDEX", "TROPONINIHS" in the last 72 hours.  BNP: No results for input(s): "BNP" in the last 72 hours.  D-Dimer: No results for input(s): "DDIMER" in the last 72 hours. Hemoglobin A1C: No results for input(s): "HGBA1C" in the last 72 hours.  Fasting Lipid Panel: No results for input(s): "CHOL", "HDL", "LDLCALC", "TRIG", "CHOLHDL", "LDLDIRECT" in the last 72 hours.  Thyroid Function Tests: No results for input(s): "TSH", "T4TOTAL", "T3FREE", "THYROIDAB" in the last 72 hours.  Invalid input(s): "FREET3"  Anemia Panel: No results for input(s): "VITAMINB12", "FOLATE", "FERRITIN", "TIBC", "IRON", "RETICCTPCT" in the last 72 hours.   Radiology: US RENAL  Result Date: 09/11/2023 CLINICAL DATA:  Acute on chronic renal failure EXAM: RENAL / URINARY TRACT ULTRASOUND COMPLETE COMPARISON:  07/29/2023.  CT 09/10/2023 FINDINGS: Right Kidney: Renal measurements: 10.3 x 6.2 x 5.8 cm = volume: 192 mL. Echogenicity within normal limits. No mass or hydronephrosis visualized. Left Kidney: Renal measurements: 7.0 x 3.5 x 2.8 cm = volume: 37 mL. Atrophic with cortical thinning. No mass or hydronephrosis. Bladder: Decompressed with Foley catheter in place. Other: Moderate  ascites in the abdomen and pelvis. IMPRESSION: No acute findings. Atrophic left kidney, stable. Moderate ascites. Electronically Signed   By: Charlett Nose M.D.   On: 09/11/2023 19:18    ECHOCARDIOGRAM COMPLETE  Result Date: 09/11/2023    ECHOCARDIOGRAM REPORT   Patient Name:   Taylor [name] Date of Exam: 09/11/2023 Medical Rec #:  [id number]    Height:       70.0 in Accession #:    [id number]   Weight:       158.3 lb Date of Birth:  1941-03-06   BSA:          1.890 m Patient Age:    81 years     BP:           103/69 mmHg Patient Gender: M            HR:           62 bpm. Exam Location:  [facility] Procedure: 2D Echo, 3D Echo, Cardiac Doppler and Color Doppler Indications:     Syncope  History:         Patient has prior history of Echocardiogram examinations, most                  recent 10/30/2022. CHF and Cardiomyopathy, CAD and Previous                  Myocardial Infarction, Pacemaker and Defibrillator, Stroke,                  Arrythmias:Atrial Fibrillation and Bradycardia,  R (chronic).  Neuro: Alert and oriented X 3. Psych: Answers questions appropriately.   Labs: Basic Metabolic Panel: Recent Labs     09/16/23 0358 09/17/23 0325  NA 134* 134*  K 3.6 3.8  CL 97* 98  CO2 29 26  GLUCOSE 125* 121*  BUN 73* 69*  CREATININE 2.42* 2.05*  CALCIUM 8.9 9.2   Liver Function Tests: Recent Labs    09/17/23 0325  AST 41  ALT 16  ALKPHOS 162*  BILITOT 1.4*  PROT 6.9  ALBUMIN 2.6*    No results for input(s): "LIPASE", "AMYLASE" in the last 72 hours. CBC: Recent Labs    09/16/23 0358 09/17/23 0325  WBC 7.5 6.0  HGB 9.8* 10.8*  HCT 30.9* 34.4*  MCV 97.2 97.2  PLT 154 138*   Cardiac Enzymes: No results for input(s): "CKTOTAL", "CKMB", "CKMBINDEX", "TROPONINIHS" in the last 72 hours.  BNP: No results for input(s): "BNP" in the last 72 hours.  D-Dimer: No results for input(s): "DDIMER" in the last 72 hours. Hemoglobin A1C: No results for input(s): "HGBA1C" in the last 72 hours.  Fasting Lipid Panel: No results for input(s): "CHOL", "HDL", "LDLCALC", "TRIG", "CHOLHDL", "LDLDIRECT" in the last 72 hours.  Thyroid Function Tests: No results for input(s): "TSH", "T4TOTAL", "T3FREE", "THYROIDAB" in the last 72 hours.  Invalid input(s): "FREET3"  Anemia Panel: No results for input(s): "VITAMINB12", "FOLATE", "FERRITIN", "TIBC", "IRON", "RETICCTPCT" in the last 72 hours.   Radiology: US RENAL  Result Date: 09/11/2023 CLINICAL DATA:  Acute on chronic renal failure EXAM: RENAL / URINARY TRACT ULTRASOUND COMPLETE COMPARISON:  07/29/2023.  CT 09/10/2023 FINDINGS: Right Kidney: Renal measurements: 10.3 x 6.2 x 5.8 cm = volume: 192 mL. Echogenicity within normal limits. No mass or hydronephrosis visualized. Left Kidney: Renal measurements: 7.0 x 3.5 x 2.8 cm = volume: 37 mL. Atrophic with cortical thinning. No mass or hydronephrosis. Bladder: Decompressed with Foley catheter in place. Other: Moderate  ascites in the abdomen and pelvis. IMPRESSION: No acute findings. Atrophic left kidney, stable. Moderate ascites. Electronically Signed   By: Charlett Nose M.D.   On: 09/11/2023 19:18    ECHOCARDIOGRAM COMPLETE  Result Date: 09/11/2023    ECHOCARDIOGRAM REPORT   Patient Name:   Taylor [name] Date of Exam: 09/11/2023 Medical Rec #:  [id number]    Height:       70.0 in Accession #:    [id number]   Weight:       158.3 lb Date of Birth:  1941-03-06   BSA:          1.890 m Patient Age:    81 years     BP:           103/69 mmHg Patient Gender: M            HR:           62 bpm. Exam Location:  [facility] Procedure: 2D Echo, 3D Echo, Cardiac Doppler and Color Doppler Indications:     Syncope  History:         Patient has prior history of Echocardiogram examinations, most                  recent 10/30/2022. CHF and Cardiomyopathy, CAD and Previous                  Myocardial Infarction, Pacemaker and Defibrillator, Stroke,                  Arrythmias:Atrial Fibrillation and Bradycardia,  [facility] CLINIC CARDIOLOGY PROGRESS NOTE       Patient ID: Taylor [name] MRN: [id number] DOB/AGE: [birth year] 82 y.o.  Admit date: 09/10/2023 Referring Physician Zada Girt, NP Primary Physician Malva Limes, MD  Primary Cardiologist Dr. Marcina Millard Reason for Consultation Chronic Heart Failure, EF < 20%  HPI: Taylor [name] is a 82 y.o. male  with a past medical history of paroxysmal atrial fibrillation (Eliquis), chronic HFrEF (EF < 20%), s/p CABG (08/2006), s/p dual-chamber ICD (2014), SSS, HTN, HLD, type 2 diabetes, pancreatitis, ischemic cardiomyopathy, ventricular fibrillation arrest (1994) s/p MI infarction, bilateral carotid artery stenosis s/p left carotid stent (06/2022), abdominal aortic aneurysm s/p endovascular repair (2008), CKD stage 4, COPD (baseline 2L Leslie) liver cirrhosis with ascites (CT 08/2023), who presented to the ED on 09/10/2023 for frequent falls and generalized weakness. Cardiology was consulted for further evaluation.   Interval History: -Patient reports he feels well overall, feels his strength is improving.  -BP and HR stable. No recurrence of chest pain off of nitroglycerin gtt.  -Denies SOB, remains on baseline O2 requirement.  -Renal function continues to improve.   Review of systems complete and found to be negative unless listed above    Past Medical History:  Diagnosis Date   AAA (abdominal aortic aneurysm) (HCC) 06/03/2007   [facility]; Dr. Hart [place]   AICD (automatic cardioverter/defibrillator) present    Arrhythmia    atrial fibrillation   Barrett's esophagus    Bladder cancer (HCC)    Bradycardia    CAD (coronary artery disease)    CAP (community acquired pneumonia) 11/13/2019   CHF (congestive heart failure) (HCC)    Cluster headache    COVID-19 09/27/2021   DDD (degenerative disc disease), lumbar    Diabetes mellitus without complication (HCC)    Dyspnea    WITH EXERTION   Edema    LEFT ANKLE   Fracture  of skull base (HCC) 1997   due to fall   GERD (gastroesophageal reflux disease)    Gout    History of bladder cancer 12/1995   Hyperlipidemia    Hypertension    Hypocalcemia 04/19/2020   Possibly secondary to diuretics.   Low=5.9 04/19/2020   Malignant melanoma (HCC) 12/2012   right dorsal forearm excised   Myocardial infarction [facility])    LAST 2014   Osteoarthritis of knee    Other specified complication of vascular prosthetic devices, implants and grafts, initial encounter (HCC) 08/22/2021   Pacemaker 10/10/2006   Pancreatitis 11/22/2019   Pneumonia    2016   Pre-diabetes    Psoriasis    Rib fracture 1997   due to fall   Sleep apnea    CPAP   Stroke [facility])    Venous incompetence     Past Surgical History:  Procedure Laterality Date   ABDOMINAL AORTIC ANEURYSM REPAIR  06/03/2007   [facility]; Dr. Hart [place]   ANGIOPLASTY  1994   [place]   BLADDER TUMOR EXCISION  12/1995   CAROTID PTA/STENT INTERVENTION Left 07/23/2022   Procedure: CAROTID PTA/STENT INTERVENTION;  Surgeon: Renford Dills, MD;  Location: [facility] INVASIVE CV LAB;  Service: Cardiovascular;  Laterality: Left;   CATARACT EXTRACTION W/PHACO Left 10/22/2016   Procedure: CATARACT EXTRACTION PHACO AND INTRAOCULAR LENS PLACEMENT (IOC);  Surgeon: Galen Manila, MD;  Location: [facility] ORS;  Service: Ophthalmology;  Laterality: Left;  [place] 47.7 AP% 18.4 CDE 8.78 Fluid pack lot # [id number]   CATARACT EXTRACTION W/PHACO Right 12/10/2016   Procedure:  [facility] CLINIC CARDIOLOGY PROGRESS NOTE       Patient ID: Taylor [name] MRN: [id number] DOB/AGE: [birth year] 82 y.o.  Admit date: 09/10/2023 Referring Physician Zada Girt, NP Primary Physician Malva Limes, MD  Primary Cardiologist Dr. Marcina Millard Reason for Consultation Chronic Heart Failure, EF < 20%  HPI: Taylor [name] is a 82 y.o. male  with a past medical history of paroxysmal atrial fibrillation (Eliquis), chronic HFrEF (EF < 20%), s/p CABG (08/2006), s/p dual-chamber ICD (2014), SSS, HTN, HLD, type 2 diabetes, pancreatitis, ischemic cardiomyopathy, ventricular fibrillation arrest (1994) s/p MI infarction, bilateral carotid artery stenosis s/p left carotid stent (06/2022), abdominal aortic aneurysm s/p endovascular repair (2008), CKD stage 4, COPD (baseline 2L [place]) liver cirrhosis with ascites (CT 08/2023), who presented to the ED on 09/10/2023 for frequent falls and generalized weakness. Cardiology was consulted for further evaluation.   Interval History: -Patient reports he feels well overall, feels his strength is improving.  -BP and HR stable. No recurrence of chest pain off of nitroglycerin gtt.  -Denies SOB, remains on baseline O2 requirement.  -Renal function continues to improve.   Review of systems complete and found to be negative unless listed above    Past Medical History:  Diagnosis Date   AAA (abdominal aortic aneurysm) (HCC) 06/03/2007   [facility]; Dr. Hart [place]   AICD (automatic cardioverter/defibrillator) present    Arrhythmia    atrial fibrillation   Barrett's esophagus    Bladder cancer (HCC)    Bradycardia    CAD (coronary artery disease)    CAP (community acquired pneumonia) 11/13/2019   CHF (congestive heart failure) (HCC)    Cluster headache    COVID-19 09/27/2021   DDD (degenerative disc disease), lumbar    Diabetes mellitus without complication (HCC)    Dyspnea    WITH EXERTION   Edema    LEFT ANKLE   Fracture  of skull base (HCC) 1997   due to fall   GERD (gastroesophageal reflux disease)    Gout    History of bladder cancer 12/1995   Hyperlipidemia    Hypertension    Hypocalcemia 04/19/2020   Possibly secondary to diuretics.   Low=5.9 04/19/2020   Malignant melanoma (HCC) 12/2012   right dorsal forearm excised   Myocardial infarction [facility])    LAST 2014   Osteoarthritis of knee    Other specified complication of vascular prosthetic devices, implants and grafts, initial encounter (HCC) 08/22/2021   Pacemaker 10/10/2006   Pancreatitis 11/22/2019   Pneumonia    2016   Pre-diabetes    Psoriasis    Rib fracture 1997   due to fall   Sleep apnea    CPAP   Stroke [facility])    Venous incompetence     Past Surgical History:  Procedure Laterality Date   ABDOMINAL AORTIC ANEURYSM REPAIR  06/03/2007   [facility]; Dr. Hart [place]   ANGIOPLASTY  1994   [place]   BLADDER TUMOR EXCISION  12/1995   CAROTID PTA/STENT INTERVENTION Left 07/23/2022   Procedure: CAROTID PTA/STENT INTERVENTION;  Surgeon: Renford Dills, MD;  Location: [facility] INVASIVE CV LAB;  Service: Cardiovascular;  Laterality: Left;   CATARACT EXTRACTION W/PHACO Left 10/22/2016   Procedure: CATARACT EXTRACTION PHACO AND INTRAOCULAR LENS PLACEMENT (IOC);  Surgeon: Galen Manila, MD;  Location: [facility] ORS;  Service: Ophthalmology;  Laterality: Left;  [place] 47.7 AP% 18.4 CDE 8.78 Fluid pack lot # [id number]   CATARACT EXTRACTION W/PHACO Right 12/10/2016   Procedure:  R (chronic).  Neuro: Alert and oriented X 3. Psych: Answers questions appropriately.   Labs: Basic Metabolic Panel: Recent Labs     09/16/23 0358 09/17/23 0325  NA 134* 134*  K 3.6 3.8  CL 97* 98  CO2 29 26  GLUCOSE 125* 121*  BUN 73* 69*  CREATININE 2.42* 2.05*  CALCIUM 8.9 9.2   Liver Function Tests: Recent Labs    09/17/23 0325  AST 41  ALT 16  ALKPHOS 162*  BILITOT 1.4*  PROT 6.9  ALBUMIN 2.6*    No results for input(s): "LIPASE", "AMYLASE" in the last 72 hours. CBC: Recent Labs    09/16/23 0358 09/17/23 0325  WBC 7.5 6.0  HGB 9.8* 10.8*  HCT 30.9* 34.4*  MCV 97.2 97.2  PLT 154 138*   Cardiac Enzymes: No results for input(s): "CKTOTAL", "CKMB", "CKMBINDEX", "TROPONINIHS" in the last 72 hours.  BNP: No results for input(s): "BNP" in the last 72 hours.  D-Dimer: No results for input(s): "DDIMER" in the last 72 hours. Hemoglobin A1C: No results for input(s): "HGBA1C" in the last 72 hours.  Fasting Lipid Panel: No results for input(s): "CHOL", "HDL", "LDLCALC", "TRIG", "CHOLHDL", "LDLDIRECT" in the last 72 hours.  Thyroid Function Tests: No results for input(s): "TSH", "T4TOTAL", "T3FREE", "THYROIDAB" in the last 72 hours.  Invalid input(s): "FREET3"  Anemia Panel: No results for input(s): "VITAMINB12", "FOLATE", "FERRITIN", "TIBC", "IRON", "RETICCTPCT" in the last 72 hours.   Radiology: US RENAL  Result Date: 09/11/2023 CLINICAL DATA:  Acute on chronic renal failure EXAM: RENAL / URINARY TRACT ULTRASOUND COMPLETE COMPARISON:  07/29/2023.  CT 09/10/2023 FINDINGS: Right Kidney: Renal measurements: 10.3 x 6.2 x 5.8 cm = volume: 192 mL. Echogenicity within normal limits. No mass or hydronephrosis visualized. Left Kidney: Renal measurements: 7.0 x 3.5 x 2.8 cm = volume: 37 mL. Atrophic with cortical thinning. No mass or hydronephrosis. Bladder: Decompressed with Foley catheter in place. Other: Moderate  ascites in the abdomen and pelvis. IMPRESSION: No acute findings. Atrophic left kidney, stable. Moderate ascites. Electronically Signed   By: Charlett Nose M.D.   On: 09/11/2023 19:18    ECHOCARDIOGRAM COMPLETE  Result Date: 09/11/2023    ECHOCARDIOGRAM REPORT   Patient Name:   Taylor [name] Date of Exam: 09/11/2023 Medical Rec #:  [id number]    Height:       70.0 in Accession #:    [id number]   Weight:       158.3 lb Date of Birth:  1941-03-06   BSA:          1.890 m Patient Age:    81 years     BP:           103/69 mmHg Patient Gender: M            HR:           62 bpm. Exam Location:  [facility] Procedure: 2D Echo, 3D Echo, Cardiac Doppler and Color Doppler Indications:     Syncope  History:         Patient has prior history of Echocardiogram examinations, most                  recent 10/30/2022. CHF and Cardiomyopathy, CAD and Previous                  Myocardial Infarction, Pacemaker and Defibrillator, Stroke,                  Arrythmias:Atrial Fibrillation and Bradycardia,  R (chronic).  Neuro: Alert and oriented X 3. Psych: Answers questions appropriately.   Labs: Basic Metabolic Panel: Recent Labs     09/16/23 0358 09/17/23 0325  NA 134* 134*  K 3.6 3.8  CL 97* 98  CO2 29 26  GLUCOSE 125* 121*  BUN 73* 69*  CREATININE 2.42* 2.05*  CALCIUM 8.9 9.2   Liver Function Tests: Recent Labs    09/17/23 0325  AST 41  ALT 16  ALKPHOS 162*  BILITOT 1.4*  PROT 6.9  ALBUMIN 2.6*    No results for input(s): "LIPASE", "AMYLASE" in the last 72 hours. CBC: Recent Labs    09/16/23 0358 09/17/23 0325  WBC 7.5 6.0  HGB 9.8* 10.8*  HCT 30.9* 34.4*  MCV 97.2 97.2  PLT 154 138*   Cardiac Enzymes: No results for input(s): "CKTOTAL", "CKMB", "CKMBINDEX", "TROPONINIHS" in the last 72 hours.  BNP: No results for input(s): "BNP" in the last 72 hours.  D-Dimer: No results for input(s): "DDIMER" in the last 72 hours. Hemoglobin A1C: No results for input(s): "HGBA1C" in the last 72 hours.  Fasting Lipid Panel: No results for input(s): "CHOL", "HDL", "LDLCALC", "TRIG", "CHOLHDL", "LDLDIRECT" in the last 72 hours.  Thyroid Function Tests: No results for input(s): "TSH", "T4TOTAL", "T3FREE", "THYROIDAB" in the last 72 hours.  Invalid input(s): "FREET3"  Anemia Panel: No results for input(s): "VITAMINB12", "FOLATE", "FERRITIN", "TIBC", "IRON", "RETICCTPCT" in the last 72 hours.   Radiology: US RENAL  Result Date: 09/11/2023 CLINICAL DATA:  Acute on chronic renal failure EXAM: RENAL / URINARY TRACT ULTRASOUND COMPLETE COMPARISON:  07/29/2023.  CT 09/10/2023 FINDINGS: Right Kidney: Renal measurements: 10.3 x 6.2 x 5.8 cm = volume: 192 mL. Echogenicity within normal limits. No mass or hydronephrosis visualized. Left Kidney: Renal measurements: 7.0 x 3.5 x 2.8 cm = volume: 37 mL. Atrophic with cortical thinning. No mass or hydronephrosis. Bladder: Decompressed with Foley catheter in place. Other: Moderate  ascites in the abdomen and pelvis. IMPRESSION: No acute findings. Atrophic left kidney, stable. Moderate ascites. Electronically Signed   By: Charlett Nose M.D.   On: 09/11/2023 19:18    ECHOCARDIOGRAM COMPLETE  Result Date: 09/11/2023    ECHOCARDIOGRAM REPORT   Patient Name:   Taylor [name] Date of Exam: 09/11/2023 Medical Rec #:  [id number]    Height:       70.0 in Accession #:    [id number]   Weight:       158.3 lb Date of Birth:  1941-03-06   BSA:          1.890 m Patient Age:    81 years     BP:           103/69 mmHg Patient Gender: M            HR:           62 bpm. Exam Location:  [facility] Procedure: 2D Echo, 3D Echo, Cardiac Doppler and Color Doppler Indications:     Syncope  History:         Patient has prior history of Echocardiogram examinations, most                  recent 10/30/2022. CHF and Cardiomyopathy, CAD and Previous                  Myocardial Infarction, Pacemaker and Defibrillator, Stroke,                  Arrythmias:Atrial Fibrillation and Bradycardia,  R (chronic).  Neuro: Alert and oriented X 3. Psych: Answers questions appropriately.   Labs: Basic Metabolic Panel: Recent Labs     09/16/23 0358 09/17/23 0325  NA 134* 134*  K 3.6 3.8  CL 97* 98  CO2 29 26  GLUCOSE 125* 121*  BUN 73* 69*  CREATININE 2.42* 2.05*  CALCIUM 8.9 9.2   Liver Function Tests: Recent Labs    09/17/23 0325  AST 41  ALT 16  ALKPHOS 162*  BILITOT 1.4*  PROT 6.9  ALBUMIN 2.6*    No results for input(s): "LIPASE", "AMYLASE" in the last 72 hours. CBC: Recent Labs    09/16/23 0358 09/17/23 0325  WBC 7.5 6.0  HGB 9.8* 10.8*  HCT 30.9* 34.4*  MCV 97.2 97.2  PLT 154 138*   Cardiac Enzymes: No results for input(s): "CKTOTAL", "CKMB", "CKMBINDEX", "TROPONINIHS" in the last 72 hours.  BNP: No results for input(s): "BNP" in the last 72 hours.  D-Dimer: No results for input(s): "DDIMER" in the last 72 hours. Hemoglobin A1C: No results for input(s): "HGBA1C" in the last 72 hours.  Fasting Lipid Panel: No results for input(s): "CHOL", "HDL", "LDLCALC", "TRIG", "CHOLHDL", "LDLDIRECT" in the last 72 hours.  Thyroid Function Tests: No results for input(s): "TSH", "T4TOTAL", "T3FREE", "THYROIDAB" in the last 72 hours.  Invalid input(s): "FREET3"  Anemia Panel: No results for input(s): "VITAMINB12", "FOLATE", "FERRITIN", "TIBC", "IRON", "RETICCTPCT" in the last 72 hours.   Radiology: US RENAL  Result Date: 09/11/2023 CLINICAL DATA:  Acute on chronic renal failure EXAM: RENAL / URINARY TRACT ULTRASOUND COMPLETE COMPARISON:  07/29/2023.  CT 09/10/2023 FINDINGS: Right Kidney: Renal measurements: 10.3 x 6.2 x 5.8 cm = volume: 192 mL. Echogenicity within normal limits. No mass or hydronephrosis visualized. Left Kidney: Renal measurements: 7.0 x 3.5 x 2.8 cm = volume: 37 mL. Atrophic with cortical thinning. No mass or hydronephrosis. Bladder: Decompressed with Foley catheter in place. Other: Moderate  ascites in the abdomen and pelvis. IMPRESSION: No acute findings. Atrophic left kidney, stable. Moderate ascites. Electronically Signed   By: Charlett Nose M.D.   On: 09/11/2023 19:18    ECHOCARDIOGRAM COMPLETE  Result Date: 09/11/2023    ECHOCARDIOGRAM REPORT   Patient Name:   Taylor [name] Date of Exam: 09/11/2023 Medical Rec #:  [id number]    Height:       70.0 in Accession #:    [id number]   Weight:       158.3 lb Date of Birth:  1941-03-06   BSA:          1.890 m Patient Age:    81 years     BP:           103/69 mmHg Patient Gender: M            HR:           62 bpm. Exam Location:  [facility] Procedure: 2D Echo, 3D Echo, Cardiac Doppler and Color Doppler Indications:     Syncope  History:         Patient has prior history of Echocardiogram examinations, most                  recent 10/30/2022. CHF and Cardiomyopathy, CAD and Previous                  Myocardial Infarction, Pacemaker and Defibrillator, Stroke,                  Arrythmias:Atrial Fibrillation and Bradycardia,  [facility] CLINIC CARDIOLOGY PROGRESS NOTE       Patient ID: Taylor [name] MRN: [id number] DOB/AGE: [birth year] 82 y.o.  Admit date: 09/10/2023 Referring Physician Zada Girt, NP Primary Physician Malva Limes, MD  Primary Cardiologist Dr. Marcina Millard Reason for Consultation Chronic Heart Failure, EF < 20%  HPI: Taylor [name] is a 82 y.o. male  with a past medical history of paroxysmal atrial fibrillation (Eliquis), chronic HFrEF (EF < 20%), s/p CABG (08/2006), s/p dual-chamber ICD (2014), SSS, HTN, HLD, type 2 diabetes, pancreatitis, ischemic cardiomyopathy, ventricular fibrillation arrest (1994) s/p MI infarction, bilateral carotid artery stenosis s/p left carotid stent (06/2022), abdominal aortic aneurysm s/p endovascular repair (2008), CKD stage 4, COPD (baseline 2L Leslie) liver cirrhosis with ascites (CT 08/2023), who presented to the ED on 09/10/2023 for frequent falls and generalized weakness. Cardiology was consulted for further evaluation.   Interval History: -Patient reports he feels well overall, feels his strength is improving.  -BP and HR stable. No recurrence of chest pain off of nitroglycerin gtt.  -Denies SOB, remains on baseline O2 requirement.  -Renal function continues to improve.   Review of systems complete and found to be negative unless listed above    Past Medical History:  Diagnosis Date   AAA (abdominal aortic aneurysm) (HCC) 06/03/2007   [facility]; Dr. Hart [place]   AICD (automatic cardioverter/defibrillator) present    Arrhythmia    atrial fibrillation   Barrett's esophagus    Bladder cancer (HCC)    Bradycardia    CAD (coronary artery disease)    CAP (community acquired pneumonia) 11/13/2019   CHF (congestive heart failure) (HCC)    Cluster headache    COVID-19 09/27/2021   DDD (degenerative disc disease), lumbar    Diabetes mellitus without complication (HCC)    Dyspnea    WITH EXERTION   Edema    LEFT ANKLE   Fracture  of skull base (HCC) 1997   due to fall   GERD (gastroesophageal reflux disease)    Gout    History of bladder cancer 12/1995   Hyperlipidemia    Hypertension    Hypocalcemia 04/19/2020   Possibly secondary to diuretics.   Low=5.9 04/19/2020   Malignant melanoma (HCC) 12/2012   right dorsal forearm excised   Myocardial infarction [facility])    LAST 2014   Osteoarthritis of knee    Other specified complication of vascular prosthetic devices, implants and grafts, initial encounter (HCC) 08/22/2021   Pacemaker 10/10/2006   Pancreatitis 11/22/2019   Pneumonia    2016   Pre-diabetes    Psoriasis    Rib fracture 1997   due to fall   Sleep apnea    CPAP   Stroke [facility])    Venous incompetence     Past Surgical History:  Procedure Laterality Date   ABDOMINAL AORTIC ANEURYSM REPAIR  06/03/2007   [facility]; Dr. Hart [place]   ANGIOPLASTY  1994   [place]   BLADDER TUMOR EXCISION  12/1995   CAROTID PTA/STENT INTERVENTION Left 07/23/2022   Procedure: CAROTID PTA/STENT INTERVENTION;  Surgeon: Renford Dills, MD;  Location: [facility] INVASIVE CV LAB;  Service: Cardiovascular;  Laterality: Left;   CATARACT EXTRACTION W/PHACO Left 10/22/2016   Procedure: CATARACT EXTRACTION PHACO AND INTRAOCULAR LENS PLACEMENT (IOC);  Surgeon: Galen Manila, MD;  Location: [facility] ORS;  Service: Ophthalmology;  Laterality: Left;  [place] 47.7 AP% 18.4 CDE 8.78 Fluid pack lot # [id number]   CATARACT EXTRACTION W/PHACO Right 12/10/2016   Procedure:  [facility] CLINIC CARDIOLOGY PROGRESS NOTE       Patient ID: Taylor [name] MRN: [id number] DOB/AGE: [birth year] 82 y.o.  Admit date: 09/10/2023 Referring Physician Zada Girt, NP Primary Physician Malva Limes, MD  Primary Cardiologist Dr. Marcina Millard Reason for Consultation Chronic Heart Failure, EF < 20%  HPI: Taylor [name] is a 82 y.o. male  with a past medical history of paroxysmal atrial fibrillation (Eliquis), chronic HFrEF (EF < 20%), s/p CABG (08/2006), s/p dual-chamber ICD (2014), SSS, HTN, HLD, type 2 diabetes, pancreatitis, ischemic cardiomyopathy, ventricular fibrillation arrest (1994) s/p MI infarction, bilateral carotid artery stenosis s/p left carotid stent (06/2022), abdominal aortic aneurysm s/p endovascular repair (2008), CKD stage 4, COPD (baseline 2L Leslie) liver cirrhosis with ascites (CT 08/2023), who presented to the ED on 09/10/2023 for frequent falls and generalized weakness. Cardiology was consulted for further evaluation.   Interval History: -Patient reports he feels well overall, feels his strength is improving.  -BP and HR stable. No recurrence of chest pain off of nitroglycerin gtt.  -Denies SOB, remains on baseline O2 requirement.  -Renal function continues to improve.   Review of systems complete and found to be negative unless listed above    Past Medical History:  Diagnosis Date   AAA (abdominal aortic aneurysm) (HCC) 06/03/2007   [facility]; Dr. Hart [place]   AICD (automatic cardioverter/defibrillator) present    Arrhythmia    atrial fibrillation   Barrett's esophagus    Bladder cancer (HCC)    Bradycardia    CAD (coronary artery disease)    CAP (community acquired pneumonia) 11/13/2019   CHF (congestive heart failure) (HCC)    Cluster headache    COVID-19 09/27/2021   DDD (degenerative disc disease), lumbar    Diabetes mellitus without complication (HCC)    Dyspnea    WITH EXERTION   Edema    LEFT ANKLE   Fracture  of skull base (HCC) 1997   due to fall   GERD (gastroesophageal reflux disease)    Gout    History of bladder cancer 12/1995   Hyperlipidemia    Hypertension    Hypocalcemia 04/19/2020   Possibly secondary to diuretics.   Low=5.9 04/19/2020   Malignant melanoma (HCC) 12/2012   right dorsal forearm excised   Myocardial infarction [facility])    LAST 2014   Osteoarthritis of knee    Other specified complication of vascular prosthetic devices, implants and grafts, initial encounter (HCC) 08/22/2021   Pacemaker 10/10/2006   Pancreatitis 11/22/2019   Pneumonia    2016   Pre-diabetes    Psoriasis    Rib fracture 1997   due to fall   Sleep apnea    CPAP   Stroke [facility])    Venous incompetence     Past Surgical History:  Procedure Laterality Date   ABDOMINAL AORTIC ANEURYSM REPAIR  06/03/2007   [facility]; Dr. Hart [place]   ANGIOPLASTY  1994   MI   BLADDER TUMOR EXCISION  12/1995   CAROTID PTA/STENT INTERVENTION Left 07/23/2022   Procedure: CAROTID PTA/STENT INTERVENTION;  Surgeon: Renford Dills, MD;  Location: [facility] INVASIVE CV LAB;  Service: Cardiovascular;  Laterality: Left;   CATARACT EXTRACTION W/PHACO Left 10/22/2016   Procedure: CATARACT EXTRACTION PHACO AND INTRAOCULAR LENS PLACEMENT (IOC);  Surgeon: Galen Manila, MD;  Location: [facility] ORS;  Service: Ophthalmology;  Laterality: Left;  [place] 47.7 AP% 18.4 CDE 8.78 Fluid pack lot # [id number]   CATARACT EXTRACTION W/PHACO Right 12/10/2016   Procedure:  [facility] CLINIC CARDIOLOGY PROGRESS NOTE       Patient ID: Taylor [name] MRN: [id number] DOB/AGE: [birth year] 82 y.o.  Admit date: 09/10/2023 Referring Physician Zada Girt, NP Primary Physician Malva Limes, MD  Primary Cardiologist Dr. Marcina Millard Reason for Consultation Chronic Heart Failure, EF < 20%  HPI: Taylor [name] is a 82 y.o. male  with a past medical history of paroxysmal atrial fibrillation (Eliquis), chronic HFrEF (EF < 20%), s/p CABG (08/2006), s/p dual-chamber ICD (2014), SSS, HTN, HLD, type 2 diabetes, pancreatitis, ischemic cardiomyopathy, ventricular fibrillation arrest (1994) s/p MI infarction, bilateral carotid artery stenosis s/p left carotid stent (06/2022), abdominal aortic aneurysm s/p endovascular repair (2008), CKD stage 4, COPD (baseline 2L Leslie) liver cirrhosis with ascites (CT 08/2023), who presented to the ED on 09/10/2023 for frequent falls and generalized weakness. Cardiology was consulted for further evaluation.   Interval History: -Patient reports he feels well overall, feels his strength is improving.  -BP and HR stable. No recurrence of chest pain off of nitroglycerin gtt.  -Denies SOB, remains on baseline O2 requirement.  -Renal function continues to improve.   Review of systems complete and found to be negative unless listed above    Past Medical History:  Diagnosis Date   AAA (abdominal aortic aneurysm) (HCC) 06/03/2007   [facility]; Dr. Hart [place]   AICD (automatic cardioverter/defibrillator) present    Arrhythmia    atrial fibrillation   Barrett's esophagus    Bladder cancer (HCC)    Bradycardia    CAD (coronary artery disease)    CAP (community acquired pneumonia) 11/13/2019   CHF (congestive heart failure) (HCC)    Cluster headache    COVID-19 09/27/2021   DDD (degenerative disc disease), lumbar    Diabetes mellitus without complication (HCC)    Dyspnea    WITH EXERTION   Edema    LEFT ANKLE   Fracture  of skull base (HCC) 1997   due to fall   GERD (gastroesophageal reflux disease)    Gout    History of bladder cancer 12/1995   Hyperlipidemia    Hypertension    Hypocalcemia 04/19/2020   Possibly secondary to diuretics.   Low=5.9 04/19/2020   Malignant melanoma (HCC) 12/2012   right dorsal forearm excised   Myocardial infarction [facility])    LAST 2014   Osteoarthritis of knee    Other specified complication of vascular prosthetic devices, implants and grafts, initial encounter (HCC) 08/22/2021   Pacemaker 10/10/2006   Pancreatitis 11/22/2019   Pneumonia    2016   Pre-diabetes    Psoriasis    Rib fracture 1997   due to fall   Sleep apnea    CPAP   Stroke [facility])    Venous incompetence     Past Surgical History:  Procedure Laterality Date   ABDOMINAL AORTIC ANEURYSM REPAIR  06/03/2007   [facility]; Dr. Hart [place]   ANGIOPLASTY  1994   MI   BLADDER TUMOR EXCISION  12/1995   CAROTID PTA/STENT INTERVENTION Left 07/23/2022   Procedure: CAROTID PTA/STENT INTERVENTION;  Surgeon: Renford Dills, MD;  Location: [facility] INVASIVE CV LAB;  Service: Cardiovascular;  Laterality: Left;   CATARACT EXTRACTION W/PHACO Left 10/22/2016   Procedure: CATARACT EXTRACTION PHACO AND INTRAOCULAR LENS PLACEMENT (IOC);  Surgeon: Galen Manila, MD;  Location: [facility] ORS;  Service: Ophthalmology;  Laterality: Left;  [place] 47.7 AP% 18.4 CDE 8.78 Fluid pack lot # [id number]   CATARACT EXTRACTION W/PHACO Right 12/10/2016   Procedure:  [facility] CLINIC CARDIOLOGY PROGRESS NOTE       Patient ID: Taylor [name] MRN: [id number] DOB/AGE: [birth year] 82 y.o.  Admit date: 09/10/2023 Referring Physician Zada Girt, NP Primary Physician Malva Limes, MD  Primary Cardiologist Dr. Marcina Millard Reason for Consultation Chronic Heart Failure, EF < 20%  HPI: Taylor [name] is a 82 y.o. male  with a past medical history of paroxysmal atrial fibrillation (Eliquis), chronic HFrEF (EF < 20%), s/p CABG (08/2006), s/p dual-chamber ICD (2014), SSS, HTN, HLD, type 2 diabetes, pancreatitis, ischemic cardiomyopathy, ventricular fibrillation arrest (1994) s/p MI infarction, bilateral carotid artery stenosis s/p left carotid stent (06/2022), abdominal aortic aneurysm s/p endovascular repair (2008), CKD stage 4, COPD (baseline 2L Leslie) liver cirrhosis with ascites (CT 08/2023), who presented to the ED on 09/10/2023 for frequent falls and generalized weakness. Cardiology was consulted for further evaluation.   Interval History: -Patient reports he feels well overall, feels his strength is improving.  -BP and HR stable. No recurrence of chest pain off of nitroglycerin gtt.  -Denies SOB, remains on baseline O2 requirement.  -Renal function continues to improve.   Review of systems complete and found to be negative unless listed above    Past Medical History:  Diagnosis Date   AAA (abdominal aortic aneurysm) (HCC) 06/03/2007   [facility]; Dr. Hart [place]   AICD (automatic cardioverter/defibrillator) present    Arrhythmia    atrial fibrillation   Barrett's esophagus    Bladder cancer (HCC)    Bradycardia    CAD (coronary artery disease)    CAP (community acquired pneumonia) 11/13/2019   CHF (congestive heart failure) (HCC)    Cluster headache    COVID-19 09/27/2021   DDD (degenerative disc disease), lumbar    Diabetes mellitus without complication (HCC)    Dyspnea    WITH EXERTION   Edema    LEFT ANKLE   Fracture  of skull base (HCC) 1997   due to fall   GERD (gastroesophageal reflux disease)    Gout    History of bladder cancer 12/1995   Hyperlipidemia    Hypertension    Hypocalcemia 04/19/2020   Possibly secondary to diuretics.   Low=5.9 04/19/2020   Malignant melanoma (HCC) 12/2012   right dorsal forearm excised   Myocardial infarction [facility])    LAST 2014   Osteoarthritis of knee    Other specified complication of vascular prosthetic devices, implants and grafts, initial encounter (HCC) 08/22/2021   Pacemaker 10/10/2006   Pancreatitis 11/22/2019   Pneumonia    2016   Pre-diabetes    Psoriasis    Rib fracture 1997   due to fall   Sleep apnea    CPAP   Stroke [facility])    Venous incompetence     Past Surgical History:  Procedure Laterality Date   ABDOMINAL AORTIC ANEURYSM REPAIR  06/03/2007   [facility]; Dr. Hart [place]   ANGIOPLASTY  1994   MI   BLADDER TUMOR EXCISION  12/1995   CAROTID PTA/STENT INTERVENTION Left 07/23/2022   Procedure: CAROTID PTA/STENT INTERVENTION;  Surgeon: Renford Dills, MD;  Location: [facility] INVASIVE CV LAB;  Service: Cardiovascular;  Laterality: Left;   CATARACT EXTRACTION W/PHACO Left 10/22/2016   Procedure: CATARACT EXTRACTION PHACO AND INTRAOCULAR LENS PLACEMENT (IOC);  Surgeon: Galen Manila, MD;  Location: [facility] ORS;  Service: Ophthalmology;  Laterality: Left;  [place] 47.7 AP% 18.4 CDE 8.78 Fluid pack lot # [id number]   CATARACT EXTRACTION W/PHACO Right 12/10/2016   Procedure:  [facility] CLINIC CARDIOLOGY PROGRESS NOTE       Patient ID: Taylor [name] MRN: [id number] DOB/AGE: [birth year] 82 y.o.  Admit date: 09/10/2023 Referring Physician Zada Girt, NP Primary Physician Malva Limes, MD  Primary Cardiologist Dr. Marcina Millard Reason for Consultation Chronic Heart Failure, EF < 20%  HPI: Taylor [name] is a 82 y.o. male  with a past medical history of paroxysmal atrial fibrillation (Eliquis), chronic HFrEF (EF < 20%), s/p CABG (08/2006), s/p dual-chamber ICD (2014), SSS, HTN, HLD, type 2 diabetes, pancreatitis, ischemic cardiomyopathy, ventricular fibrillation arrest (1994) s/p MI infarction, bilateral carotid artery stenosis s/p left carotid stent (06/2022), abdominal aortic aneurysm s/p endovascular repair (2008), CKD stage 4, COPD (baseline 2L Leslie) liver cirrhosis with ascites (CT 08/2023), who presented to the ED on 09/10/2023 for frequent falls and generalized weakness. Cardiology was consulted for further evaluation.   Interval History: -Patient reports he feels well overall, feels his strength is improving.  -BP and HR stable. No recurrence of chest pain off of nitroglycerin gtt.  -Denies SOB, remains on baseline O2 requirement.  -Renal function continues to improve.   Review of systems complete and found to be negative unless listed above    Past Medical History:  Diagnosis Date   AAA (abdominal aortic aneurysm) (HCC) 06/03/2007   [facility]; Dr. Hart [place]   AICD (automatic cardioverter/defibrillator) present    Arrhythmia    atrial fibrillation   Barrett's esophagus    Bladder cancer (HCC)    Bradycardia    CAD (coronary artery disease)    CAP (community acquired pneumonia) 11/13/2019   CHF (congestive heart failure) (HCC)    Cluster headache    COVID-19 09/27/2021   DDD (degenerative disc disease), lumbar    Diabetes mellitus without complication (HCC)    Dyspnea    WITH EXERTION   Edema    LEFT ANKLE   Fracture  of skull base (HCC) 1997   due to fall   GERD (gastroesophageal reflux disease)    Gout    History of bladder cancer 12/1995   Hyperlipidemia    Hypertension    Hypocalcemia 04/19/2020   Possibly secondary to diuretics.   Low=5.9 04/19/2020   Malignant melanoma (HCC) 12/2012   right dorsal forearm excised   Myocardial infarction [facility])    LAST 2014   Osteoarthritis of knee    Other specified complication of vascular prosthetic devices, implants and grafts, initial encounter (HCC) 08/22/2021   Pacemaker 10/10/2006   Pancreatitis 11/22/2019   Pneumonia    2016   Pre-diabetes    Psoriasis    Rib fracture 1997   due to fall   Sleep apnea    CPAP   Stroke [facility])    Venous incompetence     Past Surgical History:  Procedure Laterality Date   ABDOMINAL AORTIC ANEURYSM REPAIR  06/03/2007   [facility]; Dr. Hart [place]   ANGIOPLASTY  1994   MI   BLADDER TUMOR EXCISION  12/1995   CAROTID PTA/STENT INTERVENTION Left 07/23/2022   Procedure: CAROTID PTA/STENT INTERVENTION;  Surgeon: Renford Dills, MD;  Location: [facility] INVASIVE CV LAB;  Service: Cardiovascular;  Laterality: Left;   CATARACT EXTRACTION W/PHACO Left 10/22/2016   Procedure: CATARACT EXTRACTION PHACO AND INTRAOCULAR LENS PLACEMENT (IOC);  Surgeon: Galen Manila, MD;  Location: [facility] ORS;  Service: Ophthalmology;  Laterality: Left;  [place] 47.7 AP% 18.4 CDE 8.78 Fluid pack lot # [id number]   CATARACT EXTRACTION W/PHACO Right 12/10/2016   Procedure:  [facility] CLINIC CARDIOLOGY PROGRESS NOTE       Patient ID: Taylor [name] MRN: [id number] DOB/AGE: [birth year] 82 y.o.  Admit date: 09/10/2023 Referring Physician Zada Girt, NP Primary Physician Malva Limes, MD  Primary Cardiologist Dr. Marcina Millard Reason for Consultation Chronic Heart Failure, EF < 20%  HPI: Taylor [name] is a 82 y.o. male  with a past medical history of paroxysmal atrial fibrillation (Eliquis), chronic HFrEF (EF < 20%), s/p CABG (08/2006), s/p dual-chamber ICD (2014), SSS, HTN, HLD, type 2 diabetes, pancreatitis, ischemic cardiomyopathy, ventricular fibrillation arrest (1994) s/p MI infarction, bilateral carotid artery stenosis s/p left carotid stent (06/2022), abdominal aortic aneurysm s/p endovascular repair (2008), CKD stage 4, COPD (baseline 2L Leslie) liver cirrhosis with ascites (CT 08/2023), who presented to the ED on 09/10/2023 for frequent falls and generalized weakness. Cardiology was consulted for further evaluation.   Interval History: -Patient reports he feels well overall, feels his strength is improving.  -BP and HR stable. No recurrence of chest pain off of nitroglycerin gtt.  -Denies SOB, remains on baseline O2 requirement.  -Renal function continues to improve.   Review of systems complete and found to be negative unless listed above    Past Medical History:  Diagnosis Date   AAA (abdominal aortic aneurysm) (HCC) 06/03/2007   [facility]; Dr. Hart [place]   AICD (automatic cardioverter/defibrillator) present    Arrhythmia    atrial fibrillation   Barrett's esophagus    Bladder cancer (HCC)    Bradycardia    CAD (coronary artery disease)    CAP (community acquired pneumonia) 11/13/2019   CHF (congestive heart failure) (HCC)    Cluster headache    COVID-19 09/27/2021   DDD (degenerative disc disease), lumbar    Diabetes mellitus without complication (HCC)    Dyspnea    WITH EXERTION   Edema    LEFT ANKLE   Fracture  of skull base (HCC) 1997   due to fall   GERD (gastroesophageal reflux disease)    Gout    History of bladder cancer 12/1995   Hyperlipidemia    Hypertension    Hypocalcemia 04/19/2020   Possibly secondary to diuretics.   Low=5.9 04/19/2020   Malignant melanoma (HCC) 12/2012   right dorsal forearm excised   Myocardial infarction [facility])    LAST 2014   Osteoarthritis of knee    Other specified complication of vascular prosthetic devices, implants and grafts, initial encounter (HCC) 08/22/2021   Pacemaker 10/10/2006   Pancreatitis 11/22/2019   Pneumonia    2016   Pre-diabetes    Psoriasis    Rib fracture 1997   due to fall   Sleep apnea    CPAP   Stroke [facility])    Venous incompetence     Past Surgical History:  Procedure Laterality Date   ABDOMINAL AORTIC ANEURYSM REPAIR  06/03/2007   [facility]; Dr. Hart [place]   ANGIOPLASTY  1994   MI   BLADDER TUMOR EXCISION  12/1995   CAROTID PTA/STENT INTERVENTION Left 07/23/2022   Procedure: CAROTID PTA/STENT INTERVENTION;  Surgeon: Renford Dills, MD;  Location: [facility] INVASIVE CV LAB;  Service: Cardiovascular;  Laterality: Left;   CATARACT EXTRACTION W/PHACO Left 10/22/2016   Procedure: CATARACT EXTRACTION PHACO AND INTRAOCULAR LENS PLACEMENT (IOC);  Surgeon: Galen Manila, MD;  Location: [facility] ORS;  Service: Ophthalmology;  Laterality: Left;  [place] 47.7 AP% 18.4 CDE 8.78 Fluid pack lot # [id number]   CATARACT EXTRACTION W/PHACO Right 12/10/2016   Procedure:

## 2023-09-17 NOTE — Progress Notes (Signed)
Physical Therapy Treatment Patient Details Name: Taylor [name] MRN: [id number] DOB: December 14, 1940 Today's Date: 09/17/2023   History of Present Illness Taylor [name] is a 82 y.o. male with medical history significant of sCHF with EF < 20%, CKD-4, HTN, HLD, DM, COPD on 2L O2, CAD with stent and hx of CABG, stroke, A fib on Eliquis, s/p of AICD, melanoma, psoriatic arthritis, pancreatitis, liver cirrhosis with ascites (shown by recent CT scan on 08/29/23), who presents with fall.  Patient was seen in ED on 08/29/2023 due to fall.  Patient was found to have minimally displaced and comminuted radial styloid fracture. Pt was started on splint to left forearm.  CT of the maxillofacial images on 08/29/23 showed minimally displaced and comminuted radial styloid fracture.    PT Comments  Patient is making progress towards PT goals. Several transfer bouts performed with +2 person assistance required for standing with cues to maintain NWB of LUE. Two standing bouts performed using left platform walker and steadying assistance provided. Patient reports no chest pain with mobility. Sp02 92-97% on 2 L02. Activity tolerance is limited by fatigue. Recommend rehabilitation after this hospital stay < 3 hours/day.    If plan is discharge home, recommend the following: A little help with walking and/or transfers;A little help with bathing/dressing/bathroom;Assistance with cooking/housework;Assist for transportation;Help with stairs or ramp for entrance   Can travel by private vehicle     Yes  Equipment Recommendations  None recommended by PT    Recommendations for Other Services       Precautions / Restrictions Precautions Precautions: Fall Restrictions Weight Bearing Restrictions: Yes LUE Weight Bearing: Weight bear through elbow only     Mobility  Bed Mobility Overal bed mobility: Needs Assistance Bed Mobility: Supine to Sit     Supine to sit: Min assist     General bed mobility comments: trunk support  provided    Transfers Overall transfer level: Needs assistance Equipment used: Left platform walker Transfers: Sit to/from Stand, Bed to chair/wheelchair/BSC Sit to Stand: +2 physical assistance, Max assist, Min assist   Step pivot transfers: Min assist, +2 physical assistance       General transfer comment: several bouts of standing performed with cues for NWB of L wrist. + 2 person assistance required.    Ambulation/Gait Ambulation/Gait assistance: Min assist, +2 safety/equipment Gait Distance (Feet): 15 Feet (37ft x 2 bouts with short seated rest break) Assistive device: Left platform walker Gait Pattern/deviations: Decreased stride length Gait velocity: decreased     General Gait Details: emphasis on using left elbow only for weight bearing using platform walker for support. activity tolerance is limited by fatigue with no chest pain reported with mobility. heart rate in the 60's-70's. Sp02 92-97% on 2 L02.   Stairs             Wheelchair Mobility     Tilt Bed    Modified Rankin (Stroke Patients Only)       Balance Overall balance assessment: Needs assistance Sitting-balance support: Feet supported Sitting balance-Leahy Scale: Good     Standing balance support: Bilateral upper extremity supported, Reliant on assistive device for balance Standing balance-Leahy Scale: Poor Standing balance comment: external support required                            Cognition Arousal: Alert Behavior During Therapy: WFL for tasks assessed/performed Overall Cognitive Status: Within Functional Limits for tasks assessed  Exercises      General Comments        Pertinent Vitals/Pain Pain Assessment Pain Assessment: Faces Faces Pain Scale: Hurts a little bit Pain Location: bilateral knee Pain Descriptors / Indicators: Discomfort Pain Intervention(s): Limited activity within patient's tolerance,  Monitored during session, Repositioned    Home Living                          Prior Function            PT Goals (current goals can now be found in the care plan section) Acute Rehab PT Goals Patient Stated Goal: get back to walking PT Goal Formulation: With patient Time For Goal Achievement: 09/24/23 Potential to Achieve Goals: Fair Progress towards PT goals: Progressing toward goals    Frequency    Min 1X/week      PT Plan      Co-evaluation PT/OT/SLP Co-Evaluation/Treatment: Yes Reason for Co-Treatment: Complexity of the patient's impairments (multi-system involvement) PT goals addressed during session: Mobility/safety with mobility        AM-PAC PT "6 Clicks" Mobility   Outcome Measure  Help needed turning from your back to your side while in a flat bed without using bedrails?: A Little Help needed moving from lying on your back to sitting on the side of a flat bed without using bedrails?: A Little Help needed moving to and from a bed to a chair (including a wheelchair)?: A Lot Help needed standing up from a chair using your arms (e.g., wheelchair or bedside chair)?: A Lot Help needed to walk in hospital room?: A Little Help needed climbing 3-5 steps with a railing? : Total 6 Click Score: 14    End of Session Equipment Utilized During Treatment: Oxygen Activity Tolerance: Patient tolerated treatment well Patient left: in chair;with call bell/phone within reach;with family/visitor present   PT Visit Diagnosis: Other abnormalities of gait and mobility (R26.89);Repeated falls (R29.6);Unsteadiness on feet (R26.81);Difficulty in walking, not elsewhere classified (R26.2);History of falling (Z91.81)     Time: 1610-9604 PT Time Calculation (min) (ACUTE ONLY): 23 min  Charges:    $Therapeutic Activity: 8-22 mins PT General Charges $$ ACUTE PT VISIT: 1 Visit                     Donna Bernard, PT, MPT    Ina Homes 09/17/2023, 11:21  AM

## 2023-09-17 NOTE — Plan of Care (Signed)
  Problem: Cardiac: Goal: Ability to achieve and maintain adequate cardiopulmonary perfusion will improve Outcome: Progressing   Problem: Metabolic: Goal: Ability to maintain appropriate glucose levels will improve Outcome: Progressing   Problem: Nutritional: Goal: Maintenance of adequate nutrition will improve Outcome: Progressing Goal: Progress toward achieving an optimal weight will improve Outcome: Progressing   Problem: Safety: Goal: Ability to remain free from injury will improve Outcome: Progressing

## 2023-09-17 NOTE — Progress Notes (Signed)
Wife at bedside. Report given to [facility] nurse. Awaiting EMS pick up.

## 2023-09-17 NOTE — TOC Transition Note (Signed)
Transition of Care [facility]) - CM/SW Discharge Note   Patient Details  Name: Taylor [name] MRN: [id number] Date of Birth: July 07, 1941  Transition of Care [facility]) CM/SW Contact:  Truddie Hidden, RN Phone Number: 09/17/2023, 2:26 PM   Clinical Narrative:    Spoke with Tammy  in admissions at [facility]  Per facility patient admission confirmed for today. Patient assigned room # [phone] Nurse will call report to [phone] Face sheet and medical necessity forms printed to the floor to be added to the EMS pack EMS arranged  Discharge summary and SNF transfer report sent in HUB.  Nurse, and family notified spoke with his spouse TOC signing off.       Barriers to Discharge: Continued Medical Work up   Patient Goals and CMS Choice      Discharge Placement                         Discharge Plan and Services Additional resources added to the After Visit Summary for                                       Social Determinants of Health (SDOH) Interventions SDOH Screenings   Food Insecurity: No Food Insecurity (05/12/2023)  Housing: Low Risk  (05/12/2023)  Transportation Needs: No Transportation Needs (05/12/2023)  Utilities: Not At Risk (05/12/2023)  Alcohol Screen: Low Risk  (05/12/2023)  Depression (PHQ2-9): High Risk (08/15/2023)  Financial Resource Strain: Low Risk  (05/12/2023)  Physical Activity: Insufficiently Active (05/12/2023)  Social Connections: Moderately Isolated (05/12/2023)  Stress: Stress Concern Present (05/12/2023)  Tobacco Use: Medium Risk (09/10/2023)     Readmission Risk Interventions    09/12/2023    3:46 PM  Readmission Risk Prevention Plan  Transportation Screening Complete  Medication Review (RN Care Manager) Complete  PCP or Specialist appointment within 3-5 days of discharge Complete  HRI or Home Care Consult Complete  SW Recovery Care/Counseling Consult Complete  Palliative Care Screening Not Applicable  Skilled Nursing Facility  Complete

## 2023-09-18 ENCOUNTER — Telehealth: Payer: Self-pay

## 2023-09-18 NOTE — Progress Notes (Signed)
PCP: Mila Merry, MD (last seen 09/24) Primary Cardiologist: Marcina Millard, MD (last seen 09/24) AD[facility] provider: Florestine Avers, MD (last seen 07/24)   HPI:  Taylor [name] is a 82 y/o male with a history of AAA, bladder cancer, DM, atrial fibrillation, CAD s/p DES, hyperlipidemia, CKD, restrictive lung disease,HTN, stroke, liver cirrhosis, AICD, GERD, melanoma, pancreatitis, sleep apnea, VF and chronic heart failure. Had paracentesis done 07/29/23 with removal of 4.8L. Previous paracentesis done 04/24/23 with removal of 6.8L  Was in the ED 10/27/22 due to nausea epigastric discomfort some shortness of breath. Admitted 10/30/22 due to DOE and orthopnea x 7 days. Chest xray showed cardiomegaly and central vascular congestion. Cardiology & nephrology consults obtained. Given IV lasix. Cr worsened with diuretics so discontinued and put on gentle fluids in setting of HF. Placed on oxygen. Discharged after 3 days.   Admitted 02/25/23 due to worsening SOB due to acute/ chronic HF. Placed on oxygen. Chest x-ray with enlargement of cardiac silhouette and mild pulmonary edema. CT of the chest abdomen pelvis with moderate to severe ascites with anasarca. Paracentesis yielded 6L fluids. Jardiance held due to AKI.    Admitted 04/03/23 due to worsening lower extremity swelling, abdominal distention, shortness of breath and productive cough for last few days. Had not been taking diuretic per cardiology. Needed 4L supplemental oxygen. Chest x-ray shows cardiomegaly with mild to moderate pulmonary edema and bilateral pleural effusion. CT abdomen shows moderate ascites with anasarca, nodular liver and severe atrophy of left kidney. IV diuresed. Underwent ultrasound-guided paracentesis 6.6 L of clear fluid drained. Patient was continued on IV Lasix. Admitted 04/23/23 due to weight gain, shortness of breath, and falling at [facility] hardware store. IV diuresed. S/p paracentesis with removal of 6.8 L of amber-colored fluid. Placed back  on oral diuretics.   Was in the ED 08/29/23 due to mechanical fall. Stumbled outside over his feet and fell on his face on the concrete. No loss of consciousness. Facial fracture, rad styloid fx. Hematoma around left eye. Admitted 09/10/23 due to weakness with resultant 3 falls day of admission. Found to be (+)sepsis, severe - hypothermic Tmin 83F, low BP 80s/60s, AKI, hyperkalemic K max 6.8 improved w/ tx, Cr on admission 5.46 above baseline 2.7, lactic max 3.8. CT (+)PNA RML and LLL, mod R pleural effusion also noted on previous CT. Tx midodrine, steroids, fluids, vanc/cefepime, needing pressors. Echo LVEF <20%, severely decreased fxn and global hypokinesis, grade III diastolic dysfunction, severe RV overload, severe RA and LA dilation.  Cardiology consulted. Palliative consulted. Renal function improved. PT/OT and discharged to SNF.     Echo 03/11/17: EF 25% with mild LVH, severe LAE, moderate Taylor  Echo 12/28/2018: EF 20% with mild LVH, moderate Taylor, mild AR/TR/PR, mild pHTN Echo 11/13/19: EF 20-25% with mild LAE/RAE and moderate Taylor/TR Echo 08/10/20: EF 35-40% along with mild Taylor and borderline dilatation of aortic root at 39 mm Echo 10/30/22: EF of <20% along with mild LVH, severe LAE, severe Taylor, mild AR and moderate/severe TR.  TEE 02/17/23: EF 20% with moderate Taylor, mild AR, moderate TR, no thrombus Echo 09/11/23: EF <20% with mild LVH, Grade III DD, severe RV pressure, severe LAE/RAE, moderate/severe Taylor, severe TR  Stress test 05/07/21: Moderate to severe LV systolic dysfunction  Large perfusion abnormality of severe intensity in the anterior apical  myocardial perfusion distribution consistent with previous infarct and/or  scar without evidence of myocardial ischemia   RHC/LHC 10/02/12: Left Main:  normal       LAD  system:  significant       LCX system:  significant       RCA system:  normal       Number of vessels with significant CAD:  2 - Vessel   Left Ventriculogram       Ejection Fraction:    65%   He presents today for a HF follow-up visit with a chief complaint of moderate SOB with little exertion. Wearing oxygen at 2-2.5L around the clock. Has associated cough, pedal edema (worsening), bilateral foot pain, fatigue, abdominal distention, constipation and loose cough along with this. Denies chest pain, palpitations or difficulty sleeping. Currently at rehab and his wife says that he doesn't have a recliner in his room so tends to sit with his legs down all day unless he's in the bed.   Not weighing daily. Diuretic and SGLT2 had been stopped during recent admission due to AKI and were not resumed at discharge. He is concerned because he was treated for gout in his knee during admission and says his foot pain goes from "one foot to the other".   ROS: All systems negative except as listed in HPI, PMH and Problem List.  SH:  Social History   Socioeconomic History   Marital status: Married    Spouse name: Taylor [name]   Number of children: 3   Years of education: Not on file   Highest education level: 12th grade  Occupational History   Occupation: retired    Comment: previously worked as a Chief Financial Officer  Tobacco Use   Smoking status: Former    Current packs/day: 0.00    Average packs/day: 1 pack/day for 30.0 years (30.0 ttl pk-yrs)    Types: Cigarettes    Start date: 11/25/1969    Quit date: 11/26/1999    Years since quitting: 23.8   Smokeless tobacco: Never  Vaping Use   Vaping status: Never Used  Substance and Sexual Activity   Alcohol use: No   Drug use: No   Sexual activity: Not Currently  Other Topics Concern   Not on file  Social History Narrative   Lives at home with his family.  Independent at baseline.   Social Determinants of Health   Financial Resource Strain: Low Risk  (05/12/2023)   Overall Financial Resource Strain (CARDIA)    Difficulty of Paying Living Expenses: Not hard at all  Food Insecurity: No Food Insecurity (05/12/2023)   Hunger Vital Sign    Worried  About Running Out of Food in the Last Year: Never true    Ran Out of Food in the Last Year: Never true  Transportation Needs: No Transportation Needs (05/12/2023)   PRAPARE - Administrator, Civil Service (Medical): No    Lack of Transportation (Non-Medical): No  Physical Activity: Insufficiently Active (05/12/2023)   Exercise Vital Sign    Days of Exercise per Week: 4 days    Minutes of Exercise per Session: 30 min  Stress: Stress Concern Present (05/12/2023)   [employer] of Occupational Health - Occupational Stress Questionnaire    Feeling of Stress : To some extent  Social Connections: Moderately Isolated (05/12/2023)   Social Connection and Isolation Panel [NHANES]    Frequency of Communication with Friends and Family: Once a week    Frequency of Social Gatherings with Friends and Family: Once a week    Attends Religious Services: More than 4 times per year    Active Member of [employer] or Organizations: No    Attends [employer]  or Organization Meetings: Never    Marital Status: Married  Catering manager Violence: Not At Risk (05/12/2023)   Humiliation, Afraid, Rape, and Kick questionnaire    Fear of Current or Ex-Partner: No    Emotionally Abused: No    Physically Abused: No    Sexually Abused: No    FH:  Family History  Problem Relation Age of Onset   Cancer Mother        Melanoma skin cancer   Heart attack Father 32   Cancer Father        throat cancer   Arthritis Brother     Past Medical History:  Diagnosis Date   AAA (abdominal aortic aneurysm) (HCC) 06/03/2007   [facility]; Dr. Hart [place]   AICD (automatic cardioverter/defibrillator) present    Arrhythmia    atrial fibrillation   Barrett's esophagus    Bladder cancer (HCC)    Bradycardia    CAD (coronary artery disease)    CAP (community acquired pneumonia) 11/13/2019   CHF (congestive heart failure) (HCC)    Cluster headache    COVID-19 09/27/2021   DDD (degenerative disc disease),  lumbar    Diabetes mellitus without complication (HCC)    Dyspnea    WITH EXERTION   Edema    LEFT ANKLE   Fracture of skull base (HCC) 1997   due to fall   GERD (gastroesophageal reflux disease)    Gout    History of bladder cancer 12/1995   Hyperlipidemia    Hypertension    Hypocalcemia 04/19/2020   Possibly secondary to diuretics.   Low=5.9 04/19/2020   Malignant melanoma (HCC) 12/2012   right dorsal forearm excised   Myocardial infarction [facility])    LAST 2014   Osteoarthritis of knee    Other specified complication of vascular prosthetic devices, implants and grafts, initial encounter (HCC) 08/22/2021   Pacemaker 10/10/2006   Pancreatitis 11/22/2019   Pneumonia    2016   Pre-diabetes    Psoriasis    Rib fracture 1997   due to fall   Sleep apnea    CPAP   Stroke (HCC)    Venous incompetence     Current Outpatient Medications  Medication Sig Dispense Refill   acetaminophen (TYLENOL) 500 MG tablet Take 500 mg by mouth every 6 (six) hours as needed for mild pain.     albuterol (VENTOLIN HFA) 108 (90 Base) MCG/ACT inhaler Inhale 2 puffs into the lungs every 6 (six) hours as needed for wheezing or shortness of breath. 8 g 2   apixaban (ELIQUIS) 2.5 MG TABS tablet Take 1 tablet (2.5 mg total) by mouth 2 (two) times daily. 60 tablet 1   aspirin 81 MG EC tablet Take 81 mg by mouth daily.      atorvastatin (LIPITOR) 40 MG tablet TAKE 1 TABLET(40 MG) BY MOUTH DAILY 90 tablet 1   ipratropium-albuterol (DUONEB) 0.5-2.5 (3) MG/3ML SOLN Inhale 3 mLs into the lungs every 4 (four) hours as needed (wheezing, shob). 360 mL 1   isosorbide mononitrate (IMDUR) 30 MG 24 hr tablet Take 0.5 tablets (15 mg total) by mouth daily.     Magnesium 200 MG TABS Take 1 tablet (200 mg total) by mouth in the morning and at bedtime. 60 tablet 5   metoprolol succinate (TOPROL-XL) 25 MG 24 hr tablet Take 1 tablet (25 mg total) by mouth daily.     pantoprazole (PROTONIX) 40 MG tablet TAKE 1 TABLET BY MOUTH  EVERY DAY 90 tablet 4  triamcinolone ointment (KENALOG) 0.1 % APPLY TOPICALLY TWICE DAILY AS DIRECTED 454 g 3   No current facility-administered medications for this visit.   Vitals:   09/19/23 1310 09/19/23 1319  BP: 117/81   Pulse: 63 (!) 59  SpO2: 92% 95%  Weight: 171 lb (77.6 kg)    Wt Readings from Last 3 Encounters:  09/19/23 171 lb (77.6 kg)  09/17/23 168 lb 14 oz (76.6 kg)  08/15/23 176 lb 11.2 oz (80.2 kg)   Lab Results  Component Value Date   CREATININE 2.05 (H) 09/17/2023   CREATININE 2.42 (H) 09/16/2023   CREATININE 3.09 (H) 09/15/2023   PHYSICAL EXAM:  General:  Well appearing. No resp difficulty HEENT: normal Neck: supple. JVP elevated. No lymphadenopathy or thryomegaly appreciated. Cor: PMI normal. Regular rhythm, bradycardic. No rubs, gallops or murmurs. Lungs: clear Abdomen: soft, nontender, distended. No hepatosplenomegaly. No bruits or masses. Good bowel sounds. Extremities: no cyanosis, clubbing, rash, 2+ pitting edema bilateral lower legs, tender to touch Neuro: alert & orientedx3, cranial nerves grossly intact. Moves all 4 extremities w/o difficulty. Affect pleasant.   ECG: not done  ReDs: 33%   ASSESSMENT & PLAN:  1: Ischemic heart failure with reduced ejection fraction- - NYHA class III - minimally fluid overloaded with increased edema and worsening SOB - not weighing daily; order written for daily weights with parameters to call for overnight weight gain of > 2 pounds or a weekly weight gain of > 5 pounds - weight up 4 pounds from last visit here 6 weeks ago - Stress test 05/07/21 showed moderate to severe LV systolic dysfunction, large perfusion abnormality of severe intensity in the anterior apical myocardial perfusion distribution consistent with previous infarct and/or scar without evidence of myocardial ischemia  - Reds 33% - had paracentesis on 07/29/23 with removal of 4.8L (has a history of portal HTN) - previous paracentesis done  04/24/23 with removal of 6.8L - not adding salt to his food - continue metoprolol succinate 25mg  BID - jardiance/ diuretics were held at discharge due to renal function - resume torsemide 20mg  daily - BMET today - BMET in 1 week with results faxed to [facility] - Echo 03/11/17: EF 25% with mild LVH, severe LAE, moderate Taylor  - Echo 12/28/2018: EF 20% with mild LVH, moderate Taylor, mild AR/TR/PR, mild pHTN - Echo 11/13/19: EF 20-25% with mild LAE/RAE and moderate Taylor/TR - Echo 08/10/20: EF 35-40% along with mild Taylor and borderline dilatation of aortic root at 39 mm - Echo 10/30/22: EF of <20% along with mild LVH, severe LAE, severe Taylor, mild AR and moderate/severe TR.  - TEE 02/17/23: EF 20% with moderate Taylor, mild AR, moderate TR, no thrombus - Echo 09/11/23: EF <20% with mild LVH, Grade III DD, severe RV pressure, severe LAE/RAE, moderate/severe Taylor, severe TR - saw [facility] Allena Katz) 07/24 - has AICD - Mg 09/13/23 was 2.2 - BNP 09/10/23 was 647.1  2: HTN- - BP 117/81 - saw PCP Sherrie Mustache) 09/24 - BMP 09/17/23 reviewed and showed sodium 134, potassium 3.8, creatinine 2.05 and GFR 32 - BMET today - saw nephrology Suezanne Jacquet) 08/24  3: Atrial fibrillation- - saw cardiology (Paraschos) 09/24 - continue apixaban 2.5mg  BID - continue ASA 81mg  daily - has pacemaker with OptiVol capacity for fluid monitoring - battery change out done 02/13/23  4: CAD- - CABG 2007 - continue atorvastatin 40mg  daily - continue isosorbide MN 15mg  daily - RHC/LHC done 10/02/12:      Left Main:  normal  LAD system:  significant       LCX system:  significant       RCA system:  normal       Left Ventriculogram Ejection Fraction: 65%   5: DM with CKD-  - A1c 09/11/23 was 6.6%  6: Sleep apnea- - not wearing CPAP in the last 2 days since at rehab - saw pulmonology Karna Christmas) 03/24 - wearing oxygen at 2L with his CPAP  7: Foot pain- - ? gout - will check uric acid today  Return in 1 month, sooner if needed.

## 2023-09-18 NOTE — Telephone Encounter (Signed)
RN called [facility] (Phone: [phone]) and spoke with Misty Stanley for appointment reminder.  Misty Stanley stated that pt will be coming to appt.

## 2023-09-19 ENCOUNTER — Encounter: Payer: Self-pay | Admitting: Family

## 2023-09-19 ENCOUNTER — Ambulatory Visit: Payer: Medicare Other | Attending: Family | Admitting: Family

## 2023-09-19 VITALS — BP 117/81 | HR 59 | Wt 171.0 lb

## 2023-09-19 DIAGNOSIS — Z9981 Dependence on supplemental oxygen: Secondary | ICD-10-CM | POA: Diagnosis not present

## 2023-09-19 DIAGNOSIS — I2510 Atherosclerotic heart disease of native coronary artery without angina pectoris: Secondary | ICD-10-CM | POA: Diagnosis not present

## 2023-09-19 DIAGNOSIS — G4730 Sleep apnea, unspecified: Secondary | ICD-10-CM | POA: Insufficient documentation

## 2023-09-19 DIAGNOSIS — E785 Hyperlipidemia, unspecified: Secondary | ICD-10-CM | POA: Insufficient documentation

## 2023-09-19 DIAGNOSIS — R059 Cough, unspecified: Secondary | ICD-10-CM | POA: Insufficient documentation

## 2023-09-19 DIAGNOSIS — Z9861 Coronary angioplasty status: Secondary | ICD-10-CM | POA: Diagnosis not present

## 2023-09-19 DIAGNOSIS — E875 Hyperkalemia: Secondary | ICD-10-CM | POA: Diagnosis not present

## 2023-09-19 DIAGNOSIS — Z7982 Long term (current) use of aspirin: Secondary | ICD-10-CM | POA: Insufficient documentation

## 2023-09-19 DIAGNOSIS — K5900 Constipation, unspecified: Secondary | ICD-10-CM | POA: Diagnosis not present

## 2023-09-19 DIAGNOSIS — M79671 Pain in right foot: Secondary | ICD-10-CM

## 2023-09-19 DIAGNOSIS — I10 Essential (primary) hypertension: Secondary | ICD-10-CM | POA: Diagnosis not present

## 2023-09-19 DIAGNOSIS — J90 Pleural effusion, not elsewhere classified: Secondary | ICD-10-CM | POA: Insufficient documentation

## 2023-09-19 DIAGNOSIS — M79672 Pain in left foot: Secondary | ICD-10-CM | POA: Insufficient documentation

## 2023-09-19 DIAGNOSIS — I5022 Chronic systolic (congestive) heart failure: Secondary | ICD-10-CM

## 2023-09-19 DIAGNOSIS — Z79899 Other long term (current) drug therapy: Secondary | ICD-10-CM | POA: Insufficient documentation

## 2023-09-19 DIAGNOSIS — G4733 Obstructive sleep apnea (adult) (pediatric): Secondary | ICD-10-CM | POA: Diagnosis not present

## 2023-09-19 DIAGNOSIS — K219 Gastro-esophageal reflux disease without esophagitis: Secondary | ICD-10-CM | POA: Diagnosis not present

## 2023-09-19 DIAGNOSIS — K7460 Unspecified cirrhosis of liver: Secondary | ICD-10-CM | POA: Diagnosis not present

## 2023-09-19 DIAGNOSIS — Z7901 Long term (current) use of anticoagulants: Secondary | ICD-10-CM | POA: Insufficient documentation

## 2023-09-19 DIAGNOSIS — I480 Paroxysmal atrial fibrillation: Secondary | ICD-10-CM | POA: Diagnosis not present

## 2023-09-19 DIAGNOSIS — E1122 Type 2 diabetes mellitus with diabetic chronic kidney disease: Secondary | ICD-10-CM

## 2023-09-19 DIAGNOSIS — M109 Gout, unspecified: Secondary | ICD-10-CM | POA: Insufficient documentation

## 2023-09-19 DIAGNOSIS — N189 Chronic kidney disease, unspecified: Secondary | ICD-10-CM | POA: Insufficient documentation

## 2023-09-19 DIAGNOSIS — R0602 Shortness of breath: Secondary | ICD-10-CM | POA: Diagnosis not present

## 2023-09-19 DIAGNOSIS — Z951 Presence of aortocoronary bypass graft: Secondary | ICD-10-CM | POA: Insufficient documentation

## 2023-09-19 DIAGNOSIS — Z95810 Presence of automatic (implantable) cardiac defibrillator: Secondary | ICD-10-CM | POA: Diagnosis not present

## 2023-09-19 DIAGNOSIS — Z8673 Personal history of transient ischemic attack (TIA), and cerebral infarction without residual deficits: Secondary | ICD-10-CM | POA: Insufficient documentation

## 2023-09-19 DIAGNOSIS — N184 Chronic kidney disease, stage 4 (severe): Secondary | ICD-10-CM | POA: Diagnosis not present

## 2023-09-19 DIAGNOSIS — Z955 Presence of coronary angioplasty implant and graft: Secondary | ICD-10-CM | POA: Insufficient documentation

## 2023-09-19 DIAGNOSIS — J811 Chronic pulmonary edema: Secondary | ICD-10-CM | POA: Insufficient documentation

## 2023-09-19 DIAGNOSIS — I130 Hypertensive heart and chronic kidney disease with heart failure and stage 1 through stage 4 chronic kidney disease, or unspecified chronic kidney disease: Secondary | ICD-10-CM | POA: Insufficient documentation

## 2023-09-19 MED ORDER — TORSEMIDE 20 MG PO TABS: 20.0000 mg | ORAL_TABLET | Freq: Every day | ORAL | 3 refills | Status: DC

## 2023-09-19 NOTE — Patient Instructions (Addendum)
Medication Changes:  Start taking Torsemide 20 mg (1 tablet) daily.   Lab Work:  Labs done today, your results will be available in MyChart, we will contact you for abnormal readings.    Special Instructions // Education:  Do the following things EVERYDAY: Weigh yourself in the morning before breakfast. Write it down and keep it in a log. Take your medicines as prescribed Eat low salt foods--Limit salt (sodium) to 2000 mg per day.  Stay as active as you can everyday Limit all fluids for the day to less than 2 liters   Follow-Up in: follow up in 1 month.     If you have any questions or concerns before your next appointment please send [place] a message through [place] or call our office at [phone] Monday-Friday 8 am-5 pm.   If you have an urgent need after hours on the weekend please call your Primary Cardiologist or the [facility] in [place] at [phone].   At the [facility], you and your health needs are our priority. We have a designated team specialized in the treatment of Heart Failure. This Care Team includes your primary Heart Failure Specialized Cardiologist (physician), Advanced Practice Providers (APPs- Physician Assistants and Nurse Practitioners), and Pharmacist who all work together to provide you with the care you need, when you need it.   You may see any of the following providers on your designated Care Team at your next follow up:  Dr. Arvilla Meres Dr. Marca Ancona Dr. Dorthula Nettles Dr. Theresia Bough Tonye Becket, NP Robbie Lis, [place] [address] [place], [place] Brynda Peon, NP [place] Lee, NP Clarisa Kindred, NP Enos Fling, PharmD

## 2023-09-19 NOTE — Progress Notes (Signed)
ReDS Vest / Clip - 09/19/23 1330       ReDS Vest / Clip   Station Marker D    Ruler Value 40    ReDS Value Range Low volume    ReDS Actual Value 33

## 2023-09-20 LAB — BASIC METABOLIC PANEL
BUN/Creatinine Ratio: 33 — ABNORMAL HIGH (ref 10–24)
BUN: 63 mg/dL — ABNORMAL HIGH (ref 8–27)
CO2: 26 mmol/L (ref 20–29)
Calcium: 9.6 mg/dL (ref 8.6–10.2)
Chloride: 99 mmol/L (ref 96–106)
Creatinine, Ser: 1.93 mg/dL — ABNORMAL HIGH (ref 0.76–1.27)
Glucose: 120 mg/dL — ABNORMAL HIGH (ref 70–99)
Potassium: 4.3 mmol/L (ref 3.5–5.2)
Sodium: 138 mmol/L (ref 134–144)
eGFR: 35 mL/min/{1.73_m2} — ABNORMAL LOW (ref >59)

## 2023-09-20 LAB — URIC ACID: Uric Acid: 10.3 mg/dL — ABNORMAL HIGH (ref 3.8–8.4)

## 2023-09-23 DIAGNOSIS — I5022 Chronic systolic (congestive) heart failure: Secondary | ICD-10-CM | POA: Diagnosis not present

## 2023-09-23 DIAGNOSIS — I255 Ischemic cardiomyopathy: Secondary | ICD-10-CM | POA: Diagnosis not present

## 2023-09-23 DIAGNOSIS — N1831 Chronic kidney disease, stage 3a: Secondary | ICD-10-CM | POA: Diagnosis not present

## 2023-09-23 DIAGNOSIS — I4901 Ventricular fibrillation: Secondary | ICD-10-CM | POA: Diagnosis not present

## 2023-09-24 DIAGNOSIS — I4891 Unspecified atrial fibrillation: Secondary | ICD-10-CM | POA: Diagnosis not present

## 2023-09-24 DIAGNOSIS — N179 Acute kidney failure, unspecified: Secondary | ICD-10-CM | POA: Diagnosis not present

## 2023-09-26 DIAGNOSIS — I4891 Unspecified atrial fibrillation: Secondary | ICD-10-CM | POA: Diagnosis not present

## 2023-09-26 DIAGNOSIS — I2510 Atherosclerotic heart disease of native coronary artery without angina pectoris: Secondary | ICD-10-CM | POA: Diagnosis not present

## 2023-09-26 DIAGNOSIS — N179 Acute kidney failure, unspecified: Secondary | ICD-10-CM | POA: Diagnosis not present

## 2023-09-29 DIAGNOSIS — I2510 Atherosclerotic heart disease of native coronary artery without angina pectoris: Secondary | ICD-10-CM | POA: Diagnosis not present

## 2023-09-29 DIAGNOSIS — E785 Hyperlipidemia, unspecified: Secondary | ICD-10-CM | POA: Diagnosis not present

## 2023-09-29 DIAGNOSIS — A419 Sepsis, unspecified organism: Secondary | ICD-10-CM | POA: Diagnosis not present

## 2023-09-29 DIAGNOSIS — Z7901 Long term (current) use of anticoagulants: Secondary | ICD-10-CM | POA: Diagnosis not present

## 2023-09-29 DIAGNOSIS — G8929 Other chronic pain: Secondary | ICD-10-CM | POA: Diagnosis not present

## 2023-09-29 DIAGNOSIS — I130 Hypertensive heart and chronic kidney disease with heart failure and stage 1 through stage 4 chronic kidney disease, or unspecified chronic kidney disease: Secondary | ICD-10-CM | POA: Diagnosis not present

## 2023-09-29 DIAGNOSIS — K219 Gastro-esophageal reflux disease without esophagitis: Secondary | ICD-10-CM | POA: Diagnosis not present

## 2023-09-29 DIAGNOSIS — J440 Chronic obstructive pulmonary disease with acute lower respiratory infection: Secondary | ICD-10-CM | POA: Diagnosis not present

## 2023-09-29 DIAGNOSIS — Z87891 Personal history of nicotine dependence: Secondary | ICD-10-CM | POA: Diagnosis not present

## 2023-09-29 DIAGNOSIS — H9190 Unspecified hearing loss, unspecified ear: Secondary | ICD-10-CM | POA: Diagnosis not present

## 2023-09-29 DIAGNOSIS — I714 Abdominal aortic aneurysm, without rupture, unspecified: Secondary | ICD-10-CM | POA: Diagnosis not present

## 2023-09-29 DIAGNOSIS — Z8616 Personal history of COVID-19: Secondary | ICD-10-CM | POA: Diagnosis not present

## 2023-09-29 DIAGNOSIS — E1122 Type 2 diabetes mellitus with diabetic chronic kidney disease: Secondary | ICD-10-CM | POA: Diagnosis not present

## 2023-09-29 DIAGNOSIS — I4891 Unspecified atrial fibrillation: Secondary | ICD-10-CM | POA: Diagnosis not present

## 2023-09-29 DIAGNOSIS — N184 Chronic kidney disease, stage 4 (severe): Secondary | ICD-10-CM | POA: Diagnosis not present

## 2023-09-29 DIAGNOSIS — L409 Psoriasis, unspecified: Secondary | ICD-10-CM | POA: Diagnosis not present

## 2023-09-29 DIAGNOSIS — Z951 Presence of aortocoronary bypass graft: Secondary | ICD-10-CM | POA: Diagnosis not present

## 2023-09-29 DIAGNOSIS — R6520 Severe sepsis without septic shock: Secondary | ICD-10-CM | POA: Diagnosis not present

## 2023-09-29 DIAGNOSIS — M109 Gout, unspecified: Secondary | ICD-10-CM | POA: Diagnosis not present

## 2023-09-29 DIAGNOSIS — Z7982 Long term (current) use of aspirin: Secondary | ICD-10-CM | POA: Diagnosis not present

## 2023-09-29 DIAGNOSIS — I5020 Unspecified systolic (congestive) heart failure: Secondary | ICD-10-CM | POA: Diagnosis not present

## 2023-09-30 DIAGNOSIS — T82898A Other specified complication of vascular prosthetic devices, implants and grafts, initial encounter: Secondary | ICD-10-CM | POA: Diagnosis not present

## 2023-09-30 DIAGNOSIS — E1122 Type 2 diabetes mellitus with diabetic chronic kidney disease: Secondary | ICD-10-CM | POA: Diagnosis not present

## 2023-09-30 DIAGNOSIS — E7800 Pure hypercholesterolemia, unspecified: Secondary | ICD-10-CM | POA: Diagnosis not present

## 2023-09-30 DIAGNOSIS — I25119 Atherosclerotic heart disease of native coronary artery with unspecified angina pectoris: Secondary | ICD-10-CM | POA: Diagnosis not present

## 2023-09-30 DIAGNOSIS — I509 Heart failure, unspecified: Secondary | ICD-10-CM | POA: Diagnosis not present

## 2023-10-03 DIAGNOSIS — G8929 Other chronic pain: Secondary | ICD-10-CM | POA: Diagnosis not present

## 2023-10-03 DIAGNOSIS — Z7901 Long term (current) use of anticoagulants: Secondary | ICD-10-CM | POA: Diagnosis not present

## 2023-10-03 DIAGNOSIS — J440 Chronic obstructive pulmonary disease with acute lower respiratory infection: Secondary | ICD-10-CM | POA: Diagnosis not present

## 2023-10-03 DIAGNOSIS — I2510 Atherosclerotic heart disease of native coronary artery without angina pectoris: Secondary | ICD-10-CM | POA: Diagnosis not present

## 2023-10-03 DIAGNOSIS — Z8616 Personal history of COVID-19: Secondary | ICD-10-CM | POA: Diagnosis not present

## 2023-10-03 DIAGNOSIS — I4891 Unspecified atrial fibrillation: Secondary | ICD-10-CM | POA: Diagnosis not present

## 2023-10-03 DIAGNOSIS — N184 Chronic kidney disease, stage 4 (severe): Secondary | ICD-10-CM | POA: Diagnosis not present

## 2023-10-03 DIAGNOSIS — Z7982 Long term (current) use of aspirin: Secondary | ICD-10-CM | POA: Diagnosis not present

## 2023-10-03 DIAGNOSIS — E785 Hyperlipidemia, unspecified: Secondary | ICD-10-CM | POA: Diagnosis not present

## 2023-10-03 DIAGNOSIS — I5020 Unspecified systolic (congestive) heart failure: Secondary | ICD-10-CM | POA: Diagnosis not present

## 2023-10-03 DIAGNOSIS — Z951 Presence of aortocoronary bypass graft: Secondary | ICD-10-CM | POA: Diagnosis not present

## 2023-10-03 DIAGNOSIS — H9190 Unspecified hearing loss, unspecified ear: Secondary | ICD-10-CM | POA: Diagnosis not present

## 2023-10-03 DIAGNOSIS — E1122 Type 2 diabetes mellitus with diabetic chronic kidney disease: Secondary | ICD-10-CM | POA: Diagnosis not present

## 2023-10-03 DIAGNOSIS — Z87891 Personal history of nicotine dependence: Secondary | ICD-10-CM | POA: Diagnosis not present

## 2023-10-03 DIAGNOSIS — K219 Gastro-esophageal reflux disease without esophagitis: Secondary | ICD-10-CM | POA: Diagnosis not present

## 2023-10-03 DIAGNOSIS — I130 Hypertensive heart and chronic kidney disease with heart failure and stage 1 through stage 4 chronic kidney disease, or unspecified chronic kidney disease: Secondary | ICD-10-CM | POA: Diagnosis not present

## 2023-10-03 DIAGNOSIS — L409 Psoriasis, unspecified: Secondary | ICD-10-CM | POA: Diagnosis not present

## 2023-10-03 DIAGNOSIS — I714 Abdominal aortic aneurysm, without rupture, unspecified: Secondary | ICD-10-CM | POA: Diagnosis not present

## 2023-10-03 DIAGNOSIS — A419 Sepsis, unspecified organism: Secondary | ICD-10-CM | POA: Diagnosis not present

## 2023-10-03 DIAGNOSIS — R6520 Severe sepsis without septic shock: Secondary | ICD-10-CM | POA: Diagnosis not present

## 2023-10-03 DIAGNOSIS — M109 Gout, unspecified: Secondary | ICD-10-CM | POA: Diagnosis not present

## 2023-10-04 DIAGNOSIS — M109 Gout, unspecified: Secondary | ICD-10-CM | POA: Diagnosis not present

## 2023-10-04 DIAGNOSIS — H9190 Unspecified hearing loss, unspecified ear: Secondary | ICD-10-CM | POA: Diagnosis not present

## 2023-10-04 DIAGNOSIS — R6520 Severe sepsis without septic shock: Secondary | ICD-10-CM | POA: Diagnosis not present

## 2023-10-04 DIAGNOSIS — A419 Sepsis, unspecified organism: Secondary | ICD-10-CM | POA: Diagnosis not present

## 2023-10-04 DIAGNOSIS — Z87891 Personal history of nicotine dependence: Secondary | ICD-10-CM | POA: Diagnosis not present

## 2023-10-04 DIAGNOSIS — K219 Gastro-esophageal reflux disease without esophagitis: Secondary | ICD-10-CM | POA: Diagnosis not present

## 2023-10-04 DIAGNOSIS — I714 Abdominal aortic aneurysm, without rupture, unspecified: Secondary | ICD-10-CM | POA: Diagnosis not present

## 2023-10-04 DIAGNOSIS — Z7982 Long term (current) use of aspirin: Secondary | ICD-10-CM | POA: Diagnosis not present

## 2023-10-04 DIAGNOSIS — N184 Chronic kidney disease, stage 4 (severe): Secondary | ICD-10-CM | POA: Diagnosis not present

## 2023-10-04 DIAGNOSIS — I5020 Unspecified systolic (congestive) heart failure: Secondary | ICD-10-CM | POA: Diagnosis not present

## 2023-10-04 DIAGNOSIS — Z951 Presence of aortocoronary bypass graft: Secondary | ICD-10-CM | POA: Diagnosis not present

## 2023-10-04 DIAGNOSIS — E1122 Type 2 diabetes mellitus with diabetic chronic kidney disease: Secondary | ICD-10-CM | POA: Diagnosis not present

## 2023-10-04 DIAGNOSIS — Z8616 Personal history of COVID-19: Secondary | ICD-10-CM | POA: Diagnosis not present

## 2023-10-04 DIAGNOSIS — E785 Hyperlipidemia, unspecified: Secondary | ICD-10-CM | POA: Diagnosis not present

## 2023-10-04 DIAGNOSIS — J440 Chronic obstructive pulmonary disease with acute lower respiratory infection: Secondary | ICD-10-CM | POA: Diagnosis not present

## 2023-10-04 DIAGNOSIS — L409 Psoriasis, unspecified: Secondary | ICD-10-CM | POA: Diagnosis not present

## 2023-10-04 DIAGNOSIS — G8929 Other chronic pain: Secondary | ICD-10-CM | POA: Diagnosis not present

## 2023-10-04 DIAGNOSIS — Z7901 Long term (current) use of anticoagulants: Secondary | ICD-10-CM | POA: Diagnosis not present

## 2023-10-04 DIAGNOSIS — I2510 Atherosclerotic heart disease of native coronary artery without angina pectoris: Secondary | ICD-10-CM | POA: Diagnosis not present

## 2023-10-04 DIAGNOSIS — I4891 Unspecified atrial fibrillation: Secondary | ICD-10-CM | POA: Diagnosis not present

## 2023-10-04 DIAGNOSIS — I130 Hypertensive heart and chronic kidney disease with heart failure and stage 1 through stage 4 chronic kidney disease, or unspecified chronic kidney disease: Secondary | ICD-10-CM | POA: Diagnosis not present

## 2023-10-06 ENCOUNTER — Ambulatory Visit: Payer: Medicare Other | Admitting: Family Medicine

## 2023-10-06 ENCOUNTER — Encounter: Payer: Self-pay | Admitting: Family Medicine

## 2023-10-06 ENCOUNTER — Ambulatory Visit: Payer: Self-pay

## 2023-10-06 ENCOUNTER — Telehealth: Payer: Self-pay

## 2023-10-06 VITALS — BP 127/76 | HR 69 | Ht 70.0 in | Wt 164.0 lb

## 2023-10-06 DIAGNOSIS — R0602 Shortness of breath: Secondary | ICD-10-CM | POA: Diagnosis not present

## 2023-10-06 DIAGNOSIS — J9811 Atelectasis: Secondary | ICD-10-CM | POA: Diagnosis not present

## 2023-10-06 DIAGNOSIS — J189 Pneumonia, unspecified organism: Secondary | ICD-10-CM | POA: Diagnosis not present

## 2023-10-06 DIAGNOSIS — I5020 Unspecified systolic (congestive) heart failure: Secondary | ICD-10-CM | POA: Diagnosis not present

## 2023-10-06 DIAGNOSIS — R053 Chronic cough: Secondary | ICD-10-CM | POA: Diagnosis not present

## 2023-10-06 DIAGNOSIS — R051 Acute cough: Secondary | ICD-10-CM | POA: Diagnosis not present

## 2023-10-06 DIAGNOSIS — J90 Pleural effusion, not elsewhere classified: Secondary | ICD-10-CM | POA: Diagnosis not present

## 2023-10-06 MED ORDER — DOXYCYCLINE HYCLATE 100 MG PO TABS: 100.0000 mg | ORAL_TABLET | Freq: Two times a day (BID) | ORAL | 0 refills | Status: AC

## 2023-10-06 NOTE — Telephone Encounter (Signed)
  Chief Complaint: HTN Symptoms: 157/115, dizziness Frequency: today checked several time and all high, lowest DBP 106 Pertinent Negatives: Patient denies HA or chest pain  Disposition: [] ED /[] Urgent Care (no appt availability in office) / [x] Appointment(In office/virtual)/ []  [place] [facility]/ [] Home Care/ [] Refused Recommended Disposition /[] [place] [facility]/ []  Follow-up with PCP Additional Notes: pt had hospital admit recently on 10/16, was dc'd 09/17/23. Pt states his medicines are messed up from hospital dc some of them and adding new ones but not sending in new prescriptions. Pt has been taking his HTN meds tho from dc list. Pt has Hosp FU appt today at 120 with PCP. Advised to keep appt and come in few mins early so we can review med list and BP. Pt verbalized understanding.   Reason for Disposition  Systolic BP  >= 160 OR Diastolic >= 100  Answer Assessment - Initial Assessment Questions 1. BLOOD PRESSURE: "What is the blood pressure?" "Did you take at least two measurements 5 minutes apart?"     157/115  2. ONSET: "When did you take your blood pressure?"     Today  3. HOW: "How did you take your blood pressure?" (e.g., automatic home BP monitor, visiting nurse)     Home BP  4. HISTORY: "Do you have a history of high blood pressure?"     HTN 5. MEDICINES: "Are you taking any medicines for blood pressure?" "Have you missed any doses recently?"     Medicines are messed up from hospital  6. OTHER SYMPTOMS: "Do you have any symptoms?" (e.g., blurred vision, chest pain, difficulty breathing, headache, weakness)     Dizziness  Protocols used: Blood Pressure - High-A-AH

## 2023-10-06 NOTE — Telephone Encounter (Signed)
LMTCB-Ok or PEC Nurse to give him the okay for verbal orders.

## 2023-10-06 NOTE — Patient Instructions (Signed)
Please review the attached list of medications and notify my office if there are any errors.   Go to the [facility] at [address] for your chest Xray (phone#  [phone] )

## 2023-10-06 NOTE — Telephone Encounter (Signed)
Copied from CRM [id number]. Topic: Quick Communication - Home Health Verbal Orders >> Oct 06, 2023 10:23 AM Everette C wrote: Caller/Agency: [facility]  Callback Number: [phone] Service Requested: Physical Therapy Frequency: 2w3 1w6 Any new concerns about the patient? No

## 2023-10-06 NOTE — Progress Notes (Signed)
Established patient visit   Patient: Taylor [name]   DOB: [birth year]   82 y.o. Male  MRN: [id number] Visit Date: 10/06/2023  Today's healthcare provider: Mila Merry, MD   Chief Complaint  Patient presents with   hospital f/u   Subjective    HPI HPI   Fall--pt stated---fell 3x in 1 day but feeling ok. Last edited by Shelly Bombard, CMA on 10/06/2023  1:17 PM.      Hospitalized  10-16 through 10/23 for HCAP, pulmonary effusion,sepsis and positive MRSA screen with CHF exacerbation. [facility] for [facility]. Jardiance and spirolactone d/c at hospital discharge. Also discharged on metoprolol but his wife states was never given prescription when was discharged from [facility]. Has continued with home PT since discharge [facility].   He reports the last few days he has started having a little more cough and dyspnea, and feeling light headed when standing. No chills or fever. No other URI sx. Had labs done at his nephrology appointment last week with stable renal panel, hgb=11.6 and WBC - 5.    Medications: Outpatient Medications Prior to Visit  Medication Sig   acetaminophen (TYLENOL) 500 MG tablet Take 500 mg by mouth every 6 (six) hours as needed for mild pain.   albuterol (VENTOLIN HFA) 108 (90 Base) MCG/ACT inhaler Inhale 2 puffs into the lungs every 6 (six) hours as needed for wheezing or shortness of breath.   apixaban (ELIQUIS) 2.5 MG TABS tablet Take 1 tablet (2.5 mg total) by mouth 2 (two) times daily.   aspirin 81 MG EC tablet Take 81 mg by mouth daily.    atorvastatin (LIPITOR) 40 MG tablet TAKE 1 TABLET(40 MG) BY MOUTH DAILY   isosorbide mononitrate (IMDUR) 30 MG 24 hr tablet Take 0.5 tablets (15 mg total) by mouth daily.   Magnesium 200 MG TABS Take 1 tablet (200 mg total) by mouth in the morning and at bedtime.   pantoprazole (PROTONIX) 40 MG tablet TAKE 1 TABLET BY MOUTH EVERY DAY   torsemide (DEMADEX) 20 MG tablet Take 1 tablet (20 mg  total) by mouth daily. (Patient taking differently: Take 20 mg by mouth daily. Taking 2 tablet a day.)   triamcinolone ointment (KENALOG) 0.1 % APPLY TOPICALLY TWICE DAILY AS DIRECTED   ipratropium-albuterol (DUONEB) 0.5-2.5 (3) MG/3ML SOLN Inhale 3 mLs into the lungs every 4 (four) hours as needed (wheezing, shob).   metoprolol succinate (TOPROL-XL) 25 MG 24 hr tablet Take 1 tablet (25 mg total) by mouth daily. (Patient not taking: Reported on 10/06/2023)   No facility-administered medications prior to visit.       Objective    BP 127/76   Pulse 69   Ht 5\' 10"  (1.778 m)   Wt 164 lb (74.4 kg)   SpO2 99%   BMI 23.53 kg/m    Physical Exam   General: Appearance:    Well developed, well nourished male in no acute distress  Eyes:    PERRL, conjunctiva/corneas clear, EOM's intact       Lungs:     Bibasilar crackles  Heart:    Normal heart rate. Irregularly irregular rhythm.  3/6 blowing, holosystolic murmur at apex   MS:   All extremities are intact.    Neurologic:   Awake, alert, oriented x 3. No apparent focal neurological defect.         Assessment & Plan     1. Acute cough Bibasilar crackles note  on exam  start doxycycline (VIBRA-TABS) 100 MG tablet; Take 1 tablet (100 mg total) by mouth 2 (two) times daily for 7 days.  Dispense: 14 tablet; Refill: 0 - DG Chest 2 View  2. Shortness of breath  - doxycycline (VIBRA-TABS) 100 MG tablet; Take 1 tablet (100 mg total) by mouth 2 (two) times daily for 7 days.  Dispense: 14 tablet; Refill: 0 - DG Chest 2 View  3. HCAP (healthcare-associated pneumonia) 3 weeks s/p hospital d/c - DG Chest 2 View  4. Systolic CHF with reduced left ventricular function, NYHA class 3 (HCC) Never started metoprolol after d/c from rehab. Consider lighted sensation and concern for possible LRI will keep on hold for now and discuss with cardiologist at scheduled appointment next week.          Mila Merry, MD  [facility] [phone] (phone) [phone] (fax)  [facility]

## 2023-10-06 NOTE — Telephone Encounter (Signed)
That's fine

## 2023-10-07 DIAGNOSIS — L409 Psoriasis, unspecified: Secondary | ICD-10-CM | POA: Diagnosis not present

## 2023-10-07 DIAGNOSIS — Z87891 Personal history of nicotine dependence: Secondary | ICD-10-CM | POA: Diagnosis not present

## 2023-10-07 DIAGNOSIS — I509 Heart failure, unspecified: Secondary | ICD-10-CM | POA: Diagnosis not present

## 2023-10-07 DIAGNOSIS — Z7982 Long term (current) use of aspirin: Secondary | ICD-10-CM | POA: Diagnosis not present

## 2023-10-07 DIAGNOSIS — K219 Gastro-esophageal reflux disease without esophagitis: Secondary | ICD-10-CM | POA: Diagnosis not present

## 2023-10-07 DIAGNOSIS — Z951 Presence of aortocoronary bypass graft: Secondary | ICD-10-CM | POA: Diagnosis not present

## 2023-10-07 DIAGNOSIS — Z8616 Personal history of COVID-19: Secondary | ICD-10-CM | POA: Diagnosis not present

## 2023-10-07 DIAGNOSIS — I2510 Atherosclerotic heart disease of native coronary artery without angina pectoris: Secondary | ICD-10-CM | POA: Diagnosis not present

## 2023-10-07 DIAGNOSIS — I714 Abdominal aortic aneurysm, without rupture, unspecified: Secondary | ICD-10-CM | POA: Diagnosis not present

## 2023-10-07 DIAGNOSIS — I25119 Atherosclerotic heart disease of native coronary artery with unspecified angina pectoris: Secondary | ICD-10-CM | POA: Diagnosis not present

## 2023-10-07 DIAGNOSIS — H9190 Unspecified hearing loss, unspecified ear: Secondary | ICD-10-CM | POA: Diagnosis not present

## 2023-10-07 DIAGNOSIS — U071 COVID-19: Secondary | ICD-10-CM | POA: Diagnosis not present

## 2023-10-07 DIAGNOSIS — T82898A Other specified complication of vascular prosthetic devices, implants and grafts, initial encounter: Secondary | ICD-10-CM | POA: Diagnosis not present

## 2023-10-07 DIAGNOSIS — L408 Other psoriasis: Secondary | ICD-10-CM | POA: Diagnosis not present

## 2023-10-07 DIAGNOSIS — M109 Gout, unspecified: Secondary | ICD-10-CM | POA: Diagnosis not present

## 2023-10-07 DIAGNOSIS — E1122 Type 2 diabetes mellitus with diabetic chronic kidney disease: Secondary | ICD-10-CM | POA: Diagnosis not present

## 2023-10-07 DIAGNOSIS — J440 Chronic obstructive pulmonary disease with acute lower respiratory infection: Secondary | ICD-10-CM | POA: Diagnosis not present

## 2023-10-07 DIAGNOSIS — G8929 Other chronic pain: Secondary | ICD-10-CM | POA: Diagnosis not present

## 2023-10-07 DIAGNOSIS — Z7901 Long term (current) use of anticoagulants: Secondary | ICD-10-CM | POA: Diagnosis not present

## 2023-10-07 DIAGNOSIS — R6520 Severe sepsis without septic shock: Secondary | ICD-10-CM | POA: Diagnosis not present

## 2023-10-07 DIAGNOSIS — N184 Chronic kidney disease, stage 4 (severe): Secondary | ICD-10-CM | POA: Diagnosis not present

## 2023-10-07 DIAGNOSIS — M1000 Idiopathic gout, unspecified site: Secondary | ICD-10-CM | POA: Diagnosis not present

## 2023-10-07 DIAGNOSIS — I4891 Unspecified atrial fibrillation: Secondary | ICD-10-CM | POA: Diagnosis not present

## 2023-10-07 DIAGNOSIS — I130 Hypertensive heart and chronic kidney disease with heart failure and stage 1 through stage 4 chronic kidney disease, or unspecified chronic kidney disease: Secondary | ICD-10-CM | POA: Diagnosis not present

## 2023-10-07 DIAGNOSIS — Z95810 Presence of automatic (implantable) cardiac defibrillator: Secondary | ICD-10-CM | POA: Diagnosis not present

## 2023-10-07 DIAGNOSIS — E785 Hyperlipidemia, unspecified: Secondary | ICD-10-CM | POA: Diagnosis not present

## 2023-10-07 DIAGNOSIS — I5020 Unspecified systolic (congestive) heart failure: Secondary | ICD-10-CM | POA: Diagnosis not present

## 2023-10-07 DIAGNOSIS — N261 Atrophy of kidney (terminal): Secondary | ICD-10-CM | POA: Diagnosis not present

## 2023-10-07 DIAGNOSIS — E7800 Pure hypercholesterolemia, unspecified: Secondary | ICD-10-CM | POA: Diagnosis not present

## 2023-10-07 DIAGNOSIS — I10 Essential (primary) hypertension: Secondary | ICD-10-CM | POA: Diagnosis not present

## 2023-10-07 DIAGNOSIS — A419 Sepsis, unspecified organism: Secondary | ICD-10-CM | POA: Diagnosis not present

## 2023-10-08 DIAGNOSIS — S01409D Unspecified open wound of unspecified cheek and temporomandibular area, subsequent encounter: Secondary | ICD-10-CM | POA: Diagnosis not present

## 2023-10-09 ENCOUNTER — Telehealth: Payer: Self-pay | Admitting: *Deleted

## 2023-10-09 DIAGNOSIS — I2510 Atherosclerotic heart disease of native coronary artery without angina pectoris: Secondary | ICD-10-CM | POA: Diagnosis not present

## 2023-10-09 DIAGNOSIS — Z7982 Long term (current) use of aspirin: Secondary | ICD-10-CM | POA: Diagnosis not present

## 2023-10-09 DIAGNOSIS — Z7901 Long term (current) use of anticoagulants: Secondary | ICD-10-CM | POA: Diagnosis not present

## 2023-10-09 DIAGNOSIS — I714 Abdominal aortic aneurysm, without rupture, unspecified: Secondary | ICD-10-CM | POA: Diagnosis not present

## 2023-10-09 DIAGNOSIS — K219 Gastro-esophageal reflux disease without esophagitis: Secondary | ICD-10-CM | POA: Diagnosis not present

## 2023-10-09 DIAGNOSIS — Z8616 Personal history of COVID-19: Secondary | ICD-10-CM | POA: Diagnosis not present

## 2023-10-09 DIAGNOSIS — I4891 Unspecified atrial fibrillation: Secondary | ICD-10-CM | POA: Diagnosis not present

## 2023-10-09 DIAGNOSIS — N184 Chronic kidney disease, stage 4 (severe): Secondary | ICD-10-CM | POA: Diagnosis not present

## 2023-10-09 DIAGNOSIS — R6520 Severe sepsis without septic shock: Secondary | ICD-10-CM | POA: Diagnosis not present

## 2023-10-09 DIAGNOSIS — E1122 Type 2 diabetes mellitus with diabetic chronic kidney disease: Secondary | ICD-10-CM | POA: Diagnosis not present

## 2023-10-09 DIAGNOSIS — H9190 Unspecified hearing loss, unspecified ear: Secondary | ICD-10-CM | POA: Diagnosis not present

## 2023-10-09 DIAGNOSIS — E785 Hyperlipidemia, unspecified: Secondary | ICD-10-CM | POA: Diagnosis not present

## 2023-10-09 DIAGNOSIS — G8929 Other chronic pain: Secondary | ICD-10-CM | POA: Diagnosis not present

## 2023-10-09 DIAGNOSIS — A419 Sepsis, unspecified organism: Secondary | ICD-10-CM | POA: Diagnosis not present

## 2023-10-09 DIAGNOSIS — M109 Gout, unspecified: Secondary | ICD-10-CM | POA: Diagnosis not present

## 2023-10-09 DIAGNOSIS — Z87891 Personal history of nicotine dependence: Secondary | ICD-10-CM | POA: Diagnosis not present

## 2023-10-09 DIAGNOSIS — J440 Chronic obstructive pulmonary disease with acute lower respiratory infection: Secondary | ICD-10-CM | POA: Diagnosis not present

## 2023-10-09 DIAGNOSIS — I130 Hypertensive heart and chronic kidney disease with heart failure and stage 1 through stage 4 chronic kidney disease, or unspecified chronic kidney disease: Secondary | ICD-10-CM | POA: Diagnosis not present

## 2023-10-09 DIAGNOSIS — I5020 Unspecified systolic (congestive) heart failure: Secondary | ICD-10-CM | POA: Diagnosis not present

## 2023-10-09 DIAGNOSIS — L409 Psoriasis, unspecified: Secondary | ICD-10-CM | POA: Diagnosis not present

## 2023-10-09 DIAGNOSIS — Z951 Presence of aortocoronary bypass graft: Secondary | ICD-10-CM | POA: Diagnosis not present

## 2023-10-09 NOTE — Patient Outreach (Signed)
  Care Coordination   Follow Up Visit Note   10/09/2023 Name: Taylor [name] MRN: [id number] DOB: 06-Feb-1941  Taylor [name] is a 82 y.o. year old male who sees [name], Demetrios Isaacs, MD for primary care. I spoke with  Taylor [name] by phone today.  What matters to the patients health and wellness today?  Patient recently discharged from SNF, but state he is doing better.  Denies any shortness of breath today.      Goals Addressed             This Visit's Progress    Develop Plan of care for Management of CHF   On track    Care Coordination Interventions: Basic overview and discussion of pathophysiology of Heart Failure reviewed Provided education on low sodium diet Reviewed Heart Failure Action Plan in depth and provided written copy Assessed need for readable accurate scales in home Advised patient to weigh each morning after emptying bladder Discussed importance of daily weight and advised patient to weigh and record daily Reviewed role of diuretics in prevention of fluid overload and management of heart failure;         SDOH assessments and interventions completed:  No     Care Coordination Interventions:  Yes, provided   Interventions Today    Flowsheet Row Most Recent Value  Chronic Disease   Chronic disease during today's visit Congestive Heart Failure (CHF)  General Interventions   General Interventions Discussed/Reviewed General Interventions Reviewed, Doctor Visits, Durable Medical Equipment (DME)  Doctor Visits Discussed/Reviewed Doctor Visits Reviewed, PCP, Specialist  [upcoming cardiology 11/19, HF cinic 11/25, PCP 1/20, nephrology 2/25]  Durable Medical Equipment (DME) Oxygen  [using oxygen more often, saturations were 82% today on room air, increased to 100 % on 2.5 liters]  PCP/Specialist Visits Compliance with follow-up visit  Exercise Interventions   Exercise Discussed/Reviewed Weight Managment  Weight Management Weight maintenance  [stable at 159 pounds]   Education Interventions   Education Provided Provided Education  Provided Verbal Education On Nutrition, Medication, When to see the doctor  Jorje Guild with home health for PT/OT, medications were reviewed on Monday with PCP office, jardiance currently on hold]       Follow up plan: Follow up call scheduled for 11/27    Encounter Outcome:  Patient Visit Completed   Rodney Langton, RN, MSN, CCM [place]  [facility], [facility] RN Care Coordinator Direct Dial: [phone] / Main [phone] Fax [phone] Email: [email or web address]: [email or web address]

## 2023-10-09 NOTE — Patient Instructions (Signed)
Visit Information  Thank you for taking time to visit with me today. Please don't hesitate to contact me if I can be of assistance to you before our next scheduled telephone appointment.  Our next appointment is by telephone on 11/27  Please call the care guide team at [phone] if you need to cancel or reschedule your appointment.   Please call the Suicide and Crisis Lifeline: 988 call the [place] National Suicide Prevention Lifeline: 830-747-3212 or TTY: 270-289-0779 TTY 803-534-5583) to talk to a trained counselor call 1-800-273-TALK (toll free, 24 hour hotline) call 911 if you are experiencing a Mental Health or Behavioral Health Crisis or need someone to talk to.  Patient verbalizes understanding of instructions and care plan provided today and agrees to view in MyChart. Active MyChart status and patient understanding of how to access instructions and care plan via MyChart confirmed with patient.     The patient has been provided with contact information for the care management team and has been advised to call with any health related questions or concerns.   Rodney Langton, RN, MSN, CCM [facility], [facility] RN Care Coordinator Direct Dial: [phone] / Main [phone] Fax [phone] Email: [email or web address]: [email or web address]

## 2023-10-10 ENCOUNTER — Ambulatory Visit: Payer: Self-pay | Admitting: *Deleted

## 2023-10-10 ENCOUNTER — Other Ambulatory Visit: Payer: Self-pay | Admitting: Pharmacist

## 2023-10-10 NOTE — Telephone Encounter (Signed)
Chief Complaint: SOB with exertion Symptoms: last night reports something was on "hose" and blocked oxygen and patient went through night with no O2. This am O2 sat was at 78 % no SOB took shower and rechecked O2 for 68 % on 2 1/2 L/[place]. Has now rested and O2 sat at 99% while talking with NT on O2 at 2 1/2 L/[place]. No SOB at rest only with exertion. Difficulty with resp. Exercises using IS. Baseline able to get to 1500 ml and now can only get IS to 1000 ml. Continues to take antibiotics as prescribed . Cough with slight yellow mucus. Frequency: this am  Pertinent Negatives: Patient denies chest pain no difficulty breathing now. No fever Disposition: [] ED /[] Urgent Care (no appt availability in office) / [] Appointment(In office/virtual)/ []  [place] [facility]/ [] Home Care/ [] Refused Recommended Disposition /[] [place] [facility]/ [x]  Follow-up with PCP Additional Notes:   Please advise if appt needed today . O2 sat back up to 99% on O2 at 2 1/2 L/[place]. Recommended to allow time to rest and then can continue resp. Exercises with IS and set goal to 1200 ml instead of 1500 ml for today . Increase goal back to 1500 ml by weekend. Recommended if SOB worsens go to ED . Please advise .      Reason for Disposition  [1] Longstanding difficulty breathing (e.g., CHF, COPD, emphysema) AND [2] WORSE than normal  Answer Assessment - Initial Assessment Questions 1. RESPIRATORY STATUS: "Describe your breathing?" (e.g., wheezing, shortness of breath, unable to speak, severe coughing)      Shortness of breath this am  2. ONSET: "When did this breathing problem begin?"      Today  3. PATTERN "Does the difficult breathing come and go, or has it been constant since it started?"      Constant  4. SEVERITY: "How bad is your breathing?" (e.g., mild, moderate, severe)    - MILD: No SOB at rest, mild SOB with walking, speaks normally in sentences, can lie down, no retractions, pulse < 100.    - MODERATE: SOB at  rest, SOB with minimal exertion and prefers to sit, cannot lie down flat, speaks in phrases, mild retractions, audible wheezing, pulse 100-120.    - SEVERE: Very SOB at rest, speaks in single words, struggling to breathe, sitting hunched forward, retractions, pulse > 120      SOB with exertion and breathing exercises 5. RECURRENT SYMPTOM: "Have you had difficulty breathing before?" If Yes, ask: "When was the last time?" and "What happened that time?"      Yes hx COPD  6. CARDIAC HISTORY: "Do you have any history of heart disease?" (e.g., heart attack, angina, bypass surgery, angioplasty)      Na  7. LUNG HISTORY: "Do you have any history of lung disease?"  (e.g., pulmonary embolus, asthma, emphysema)     Hx COPD 8. CAUSE: "What do you think is causing the breathing problem?"      Recent sickness required antibiotics 9. OTHER SYMPTOMS: "Do you have any other symptoms? (e.g., dizziness, runny nose, cough, chest pain, fever)     SOB with exertion. O2 sat back up to 99 % now onO2 @ 2 1/2 L/[place] 10. O2 SATURATION MONITOR:  "Do you use an oxygen saturation monitor (pulse oximeter) at home?" If Yes, ask: "What is your reading (oxygen level) today?" "What is your usual oxygen saturation reading?" (e.g., 95%)       Now 99 % on O2 at 2 L/[place].  SOB with exertion. Was not feeling well this am .  11. PREGNANCY: "Is there any chance you are pregnant?" "When was your last menstrual period?"       na 12. TRAVEL: "Have you traveled out of the country in the last month?" (e.g., travel history, exposures)       na  Protocols used: Breathing Difficulty-A-AH

## 2023-10-10 NOTE — Progress Notes (Signed)
   10/10/2023  Patient ID: Taylor [name], male   DOB: 22-Apr-1941, 82 y.o.   MRN: [id number]  Called and spoke with the patient on the phone today. Was an [facility] patient previously. Accidentally messed up his oxygen machine and had no oxygen while sleeping last night, but the issue is resolved now.  Medication list is up-to-date. BP appears controlled with range of 125/80 on average, per the patient. Not currently on a BP medication besides Imdur.   Continue to monitor potassium as patient is taking 40mg  of Torsemide daily with no potassium supplements.  Reports having a nosebleed last night, which usually occur while sleeping. Recommended patient trial Oxymetolazine OTC nasal spray when that occurs to assist with closing the blood vessels up better and stop the bleeding sooner. Wrote name down and will be willing to try. Said he had a fairly severe bleed awhile ago that soaked the entire bed and sheets. Reports using a nasal spray for hydration, but I do not know if question was understood properly. Will occasionally do the second tablet every other day which has helped.  As for Eliquis, reports that Dr. Sherrie Mustache handles the application for him and he has plenty leftover at this time.  For Jardiance, patient is very interested in re-starting this medication as it was very beneficial and an "energy" pill for him as he felt best on it. Advised I will send a message to Dr. Sherrie Mustache, but encouraged he continue discussing re-initiation with his other specialists as well. No fluid or swelling concerns at this time.  Note added to 11/25 visit for Dr. Bing Neighbors to discuss Jardiance with the patient further.    Marlowe Aschoff, PharmD [facility] Phone Number: [phone]

## 2023-10-12 ENCOUNTER — Other Ambulatory Visit: Payer: Self-pay

## 2023-10-12 ENCOUNTER — Encounter: Payer: Self-pay | Admitting: Emergency Medicine

## 2023-10-12 ENCOUNTER — Emergency Department
Admission: EM | Admit: 2023-10-12 | Discharge: 2023-10-13 | Disposition: A | Discharge status: Home / Self Care | Payer: Medicare Other | Attending: Emergency Medicine | Admitting: Emergency Medicine

## 2023-10-12 DIAGNOSIS — R42 Dizziness and giddiness: Secondary | ICD-10-CM

## 2023-10-12 DIAGNOSIS — E876 Hypokalemia: Secondary | ICD-10-CM | POA: Diagnosis not present

## 2023-10-12 DIAGNOSIS — E860 Dehydration: Secondary | ICD-10-CM | POA: Diagnosis not present

## 2023-10-12 DIAGNOSIS — I951 Orthostatic hypotension: Secondary | ICD-10-CM | POA: Diagnosis not present

## 2023-10-12 LAB — CBC WITH DIFFERENTIAL/PLATELET
Abs Immature Granulocytes: 0.04 10*3/uL (ref 0.00–0.07)
Basophils Absolute: 0.1 10*3/uL (ref 0.0–0.1)
Basophils Relative: 1 %
Eosinophils Absolute: 0.1 10*3/uL (ref 0.0–0.5)
Eosinophils Relative: 3 %
HCT: 40.7 % (ref 39.0–52.0)
Hemoglobin: 12 g/dL — ABNORMAL LOW (ref 13.0–17.0)
Immature Granulocytes: 1 %
Lymphocytes Relative: 25 %
Lymphs Abs: 1.4 10*3/uL (ref 0.7–4.0)
MCH: 30 pg (ref 26.0–34.0)
MCHC: 29.5 g/dL — ABNORMAL LOW (ref 30.0–36.0)
MCV: 101.8 fL — ABNORMAL HIGH (ref 80.0–100.0)
Monocytes Absolute: 0.5 10*3/uL (ref 0.1–1.0)
Monocytes Relative: 9 %
Neutro Abs: 3.3 10*3/uL (ref 1.7–7.7)
Neutrophils Relative %: 61 %
Platelets: 133 10*3/uL — ABNORMAL LOW (ref 150–400)
RBC: 4 MIL/uL — ABNORMAL LOW (ref 4.22–5.81)
RDW: 17.2 % — ABNORMAL HIGH (ref 11.5–15.5)
WBC: 5.4 10*3/uL (ref 4.0–10.5)
nRBC: 0 % (ref 0.0–0.2)

## 2023-10-12 LAB — TROPONIN I (HIGH SENSITIVITY)
Troponin I (High Sensitivity): 31 ng/L — ABNORMAL HIGH (ref <18)
Troponin I (High Sensitivity): 34 ng/L — ABNORMAL HIGH (ref <18)

## 2023-10-12 LAB — COMPREHENSIVE METABOLIC PANEL
ALT: 10 U/L (ref 0–44)
AST: 26 U/L (ref 15–41)
Albumin: 2.9 g/dL — ABNORMAL LOW (ref 3.5–5.0)
Alkaline Phosphatase: 166 U/L — ABNORMAL HIGH (ref 38–126)
Anion gap: 10 (ref 5–15)
BUN: 45 mg/dL — ABNORMAL HIGH (ref 8–23)
CO2: 30 mmol/L (ref 22–32)
Calcium: 8.7 mg/dL — ABNORMAL LOW (ref 8.9–10.3)
Chloride: 102 mmol/L (ref 98–111)
Creatinine, Ser: 2.14 mg/dL — ABNORMAL HIGH (ref 0.61–1.24)
GFR, Estimated: 30 mL/min — ABNORMAL LOW (ref >60)
Glucose, Bld: 150 mg/dL — ABNORMAL HIGH (ref 70–99)
Potassium: 2.9 mmol/L — ABNORMAL LOW (ref 3.5–5.1)
Sodium: 142 mmol/L (ref 135–145)
Total Bilirubin: 2.4 mg/dL — ABNORMAL HIGH (ref <1.2)
Total Protein: 8.1 g/dL (ref 6.5–8.1)

## 2023-10-12 LAB — MAGNESIUM: Magnesium: 1.8 mg/dL (ref 1.7–2.4)

## 2023-10-12 MED ORDER — POTASSIUM CHLORIDE CRYS ER 20 MEQ PO TBCR
40.0000 meq | EXTENDED_RELEASE_TABLET | Freq: Once | ORAL | Status: AC
  Administered 2023-10-12: 40 meq via ORAL
  Filled 2023-10-12: qty 2

## 2023-10-12 NOTE — ED Provider Triage Note (Signed)
Emergency Medicine Provider Triage Evaluation Note  Taylor [name] , a 82 y.o. male  was evaluated in triage.  Pt complains of felt like he was going to pass out so he took his BP and it was elevated. Diastolic pressure was 125, unsure of systolic. Patient has been taking mucinex recently.  Review of Systems  Positive: Dizziness, light headed, SOB Negative: CP  Physical Exam  There were no vitals taken for this visit. Gen:   Awake, no distress   Resp:  Normal effort  MSK:   Moves extremities without difficulty  Other:    Medical Decision Making  Medically screening exam initiated at 4:35 PM.  Appropriate orders placed.  Taylor [name] was informed that the remainder of the evaluation will be completed by another provider, this initial triage assessment does not replace that evaluation, and the importance of remaining in the ED until their evaluation is complete.    Cameron Ali, PA-C 10/12/23 1639

## 2023-10-12 NOTE — ED Triage Notes (Signed)
Pt to ED via POV for High blood pressure and dizziness. Pt states that this afternoon he started to feel like he was going to pass out. Pt reports that he took his blood pressure and his blood pressure was high. Pt states that he is having a hard time with coordination and he feels dizzy and light headed. Pt states that he does take blood pressure medication and that he has not missed any doses. Pt denies using cold or cough meds. Pt's speech is normal and he is not in any distress at this time.

## 2023-10-13 DIAGNOSIS — I714 Abdominal aortic aneurysm, without rupture, unspecified: Secondary | ICD-10-CM | POA: Diagnosis not present

## 2023-10-13 DIAGNOSIS — G8929 Other chronic pain: Secondary | ICD-10-CM | POA: Diagnosis not present

## 2023-10-13 DIAGNOSIS — E1122 Type 2 diabetes mellitus with diabetic chronic kidney disease: Secondary | ICD-10-CM | POA: Diagnosis not present

## 2023-10-13 DIAGNOSIS — Z961 Presence of intraocular lens: Secondary | ICD-10-CM | POA: Diagnosis not present

## 2023-10-13 DIAGNOSIS — M109 Gout, unspecified: Secondary | ICD-10-CM | POA: Diagnosis not present

## 2023-10-13 DIAGNOSIS — Z8616 Personal history of COVID-19: Secondary | ICD-10-CM | POA: Diagnosis not present

## 2023-10-13 DIAGNOSIS — I4891 Unspecified atrial fibrillation: Secondary | ICD-10-CM | POA: Diagnosis not present

## 2023-10-13 DIAGNOSIS — Z951 Presence of aortocoronary bypass graft: Secondary | ICD-10-CM | POA: Diagnosis not present

## 2023-10-13 DIAGNOSIS — K219 Gastro-esophageal reflux disease without esophagitis: Secondary | ICD-10-CM | POA: Diagnosis not present

## 2023-10-13 DIAGNOSIS — H43813 Vitreous degeneration, bilateral: Secondary | ICD-10-CM | POA: Diagnosis not present

## 2023-10-13 DIAGNOSIS — Z7982 Long term (current) use of aspirin: Secondary | ICD-10-CM | POA: Diagnosis not present

## 2023-10-13 DIAGNOSIS — H02142 Spastic ectropion of right lower eyelid: Secondary | ICD-10-CM | POA: Diagnosis not present

## 2023-10-13 DIAGNOSIS — A419 Sepsis, unspecified organism: Secondary | ICD-10-CM | POA: Diagnosis not present

## 2023-10-13 DIAGNOSIS — J440 Chronic obstructive pulmonary disease with acute lower respiratory infection: Secondary | ICD-10-CM | POA: Diagnosis not present

## 2023-10-13 DIAGNOSIS — I130 Hypertensive heart and chronic kidney disease with heart failure and stage 1 through stage 4 chronic kidney disease, or unspecified chronic kidney disease: Secondary | ICD-10-CM | POA: Diagnosis not present

## 2023-10-13 DIAGNOSIS — N184 Chronic kidney disease, stage 4 (severe): Secondary | ICD-10-CM | POA: Diagnosis not present

## 2023-10-13 DIAGNOSIS — E785 Hyperlipidemia, unspecified: Secondary | ICD-10-CM | POA: Diagnosis not present

## 2023-10-13 DIAGNOSIS — H9190 Unspecified hearing loss, unspecified ear: Secondary | ICD-10-CM | POA: Diagnosis not present

## 2023-10-13 DIAGNOSIS — I5020 Unspecified systolic (congestive) heart failure: Secondary | ICD-10-CM | POA: Diagnosis not present

## 2023-10-13 DIAGNOSIS — I2510 Atherosclerotic heart disease of native coronary artery without angina pectoris: Secondary | ICD-10-CM | POA: Diagnosis not present

## 2023-10-13 DIAGNOSIS — L409 Psoriasis, unspecified: Secondary | ICD-10-CM | POA: Diagnosis not present

## 2023-10-13 DIAGNOSIS — Z7901 Long term (current) use of anticoagulants: Secondary | ICD-10-CM | POA: Diagnosis not present

## 2023-10-13 DIAGNOSIS — Z87891 Personal history of nicotine dependence: Secondary | ICD-10-CM | POA: Diagnosis not present

## 2023-10-13 DIAGNOSIS — H02054 Trichiasis without entropian left upper eyelid: Secondary | ICD-10-CM | POA: Diagnosis not present

## 2023-10-13 DIAGNOSIS — H04223 Epiphora due to insufficient drainage, bilateral lacrimal glands: Secondary | ICD-10-CM | POA: Diagnosis not present

## 2023-10-13 DIAGNOSIS — R6520 Severe sepsis without septic shock: Secondary | ICD-10-CM | POA: Diagnosis not present

## 2023-10-13 NOTE — ED Provider Notes (Signed)
[facility] Provider Note    Event Date/Time   First MD Initiated Contact with Patient 10/12/23 2304     (approximate)   History   Hypertension and Dizziness   HPI  Taylor [name] is a 82 y.o. male   Past medical history of multiple medical comorbidities, frequent falls, and a recent admission for pneumonia and sepsis, who just came home from rehab a couple weeks ago and has been recovering very well with much improvement in his respiratory symptoms and strength overall who presents to the emergency department today with orthostatic lightheadedness over the last several days in the setting of decreased p.o. intake.  He reports no other acute medical complaints.  He specifically denies chest pain, shortness of breath, respiratory infectious symptoms, new trauma, GU symptoms, GI symptoms and has been compliant with all medications including his blood thinner Eliquis.  He denies any motor or sensory changes.  He has been ambulatory and able to perform all activities of daily living.    Independent Historian contributed to assessment above: His wife is at bedside to corroborate information past medical history as above  External Medical Documents Reviewed: Discharge summary from 09/17/2023 when he was admitted for pneumonia and sepsis      Physical Exam   Triage Vital Signs: ED Triage Vitals  Encounter Vitals Group     BP 10/12/23 1638 (!) 130/103     Systolic BP Percentile --      Diastolic BP Percentile --      Pulse Rate 10/12/23 1638 87     Resp 10/12/23 1638 16     Temp 10/12/23 1638 98.3 F (36.8 C)     Temp Source 10/12/23 2319 Oral     SpO2 10/12/23 1638 96 %     Weight 10/12/23 1639 159 lb (72.1 kg)     Height 10/12/23 1639 5\' 10"  (1.778 m)     Head Circumference --      [facility] Flow --      Pain Score 10/12/23 1639 0     Pain Loc --      Pain Education --      Exclude from Growth Chart --     Most recent vital signs: Vitals:    10/13/23 0230 10/13/23 0245  BP: (!) 127/92   Pulse:  (!) 37  Resp: (!) 21   Temp:    SpO2: 94%     General: Awake, no distress.  CV:  Good peripheral perfusion.  Resp:  Normal effort. Abd:  No distention.  Other:  Awake alert comfortable appearing with normal vital signs.  He appears mildly dehydrated with dry lips and dry mucous membranes, poor skin turgor.  His lungs are clear and his abdomen is soft and nontender to deep palpation all quadrants.  His neurologic exam is unremarkable as he has no facial asymmetry, dysarthria, finger-to-nose is normal, and no motor or sensory deficits.  Upon standing he feels mildly lightheaded but this resolves quickly and he is able to ambulate with steady gait.   ED Results / Procedures / Treatments   Labs (all labs ordered are listed, but only abnormal results are displayed) Labs Reviewed  COMPREHENSIVE METABOLIC PANEL - Abnormal; Notable for the following components:      Result Value   Potassium 2.9 (*)    Glucose, Bld 150 (*)    BUN 45 (*)    Creatinine, Ser 2.14 (*)    Calcium 8.7 (*)    Albumin  2.9 (*)    Alkaline Phosphatase 166 (*)    Total Bilirubin 2.4 (*)    GFR, Estimated 30 (*)    All other components within normal limits  CBC WITH DIFFERENTIAL/PLATELET - Abnormal; Notable for the following components:   RBC 4.00 (*)    Hemoglobin 12.0 (*)    MCV 101.8 (*)    MCHC 29.5 (*)    RDW 17.2 (*)    Platelets 133 (*)    All other components within normal limits  TROPONIN I (HIGH SENSITIVITY) - Abnormal; Notable for the following components:   Troponin I (High Sensitivity) 31 (*)    All other components within normal limits  TROPONIN I (HIGH SENSITIVITY) - Abnormal; Notable for the following components:   Troponin I (High Sensitivity) 34 (*)    All other components within normal limits  MAGNESIUM     I ordered and reviewed the above labs they are notable for his potassium is low at 2.9.  His troponin have been flat at 31 and  34  EKG  ED ECG REPORT I, Pilar Jarvis, the attending physician, personally viewed and interpreted this ECG.   Date: 10/13/2023  EKG Time: 1643  Rate: 84  Rhythm: af  Axis: nl  Intervals:none  ST&T Change: no stemi   PROCEDURES:  Critical Care performed: No  Procedures   MEDICATIONS ORDERED IN ED: Medications  potassium chloride SA (KLOR-CON M) CR tablet 40 mEq (40 mEq Oral Given 10/12/23 2356)     IMPRESSION / MDM / ASSESSMENT AND PLAN / ED COURSE  I reviewed the triage vital signs and the nursing notes.                                Patient's presentation is most consistent with acute presentation with potential threat to life or bodily function.  Differential diagnosis includes, but is not limited to, CVA, infection, dysrhythmia, electrolyte disturbance, AKI, dehydration, considered but less likely CVA ICH ACS   The patient is on the cardiac monitor to evaluate for evidence of arrhythmia and/or significant heart rate changes.  MDM:    This patient with multiple medical comorbidities and recent sepsis pneumonia discharged last month who presents to Emergency Department with orthostatic lightheadedness only in the setting of poor p.o. intake who appears mildly dehydrated.  He has no focal neurologic deficits to suggest stroke with no trauma to suggest he has a bleed in his head.  He has no other acute medical complaints.  He has hypokalemia which was repleted.  Given chief complaint of slowly orthostatic lightheadedness and his appearance of dehydration in the setting of poor p.o. intake, I doubt there is another medical emergency that accounts for his symptoms today including neurologic emergencies like CVA, or infection.  We talked about obtaining further evaluation for potential neurologic causes of his lightheadedness like stroke with an MRI but patient defers at this time.  He will be discharged with close PMD follow-up.       FINAL CLINICAL IMPRESSION(S) /  ED DIAGNOSES   Final diagnoses:  Orthostatic lightheadedness  Dizziness  Dehydration  Hypokalemia     Rx / DC Orders   ED Discharge Orders     None        Note:  This document was prepared using Dragon voice recognition software and may include unintentional dictation errors.    Pilar Jarvis, MD 10/13/23 [phone]

## 2023-10-13 NOTE — Discharge Instructions (Addendum)
You appear to be a bit dehydrated today and your symptoms match with a degree of dehydration.  Your exam did do not match with a stroke.    Your potassium was slightly low today as well for which you got an extra potassium supplement.  Low potassium may be contributing to your symptoms as well.  See the attached document regarding low potassium and dietary changes to increase your potassium.  Have your doctor recheck this level in the next 1 to 2 weeks.  To stay well-hydrated you should drink more fluids.  However with your heart failure, it is important to strike a healthy balance between too much fluid which can cause your heart failure to worsen and cause breathing trouble, and too little fluid which can cause you to be dehydrated and feel lightheaded upon standing.  Call your cardiologist for an appointment tomorrow.  If you continue to have any new or worsening symptoms call your doctor right away or come back to the emergency department.  Thank you for choosing [place] for your health care today!  Please see your primary doctor this week for a follow up appointment.   If you have any new, worsening, or unexpected symptoms call your doctor right away or come back to the emergency department for reevaluation.  It was my pleasure to care for you today.   Daneil Dan [place]esto Charon, MD

## 2023-10-14 ENCOUNTER — Telehealth: Payer: Self-pay

## 2023-10-14 DIAGNOSIS — I2510 Atherosclerotic heart disease of native coronary artery without angina pectoris: Secondary | ICD-10-CM | POA: Diagnosis not present

## 2023-10-14 DIAGNOSIS — I255 Ischemic cardiomyopathy: Secondary | ICD-10-CM | POA: Diagnosis not present

## 2023-10-14 DIAGNOSIS — I5023 Acute on chronic systolic (congestive) heart failure: Secondary | ICD-10-CM | POA: Diagnosis not present

## 2023-10-14 NOTE — Transitions of Care (Post Inpatient/ED Visit) (Signed)
10/14/2023  Name: Taylor [name] MRN: [id number] DOB: 07/02/1941  Today's TOC FU Call Status: Today's TOC FU Call Status:: Successful TOC FU Call Completed TOC FU Call Complete Date: 10/13/23 Patient's Name and Date of Birth confirmed.  Transition Care Management Follow-up Telephone Call Date of Discharge: 10/13/23 Discharge Facility: [facility]) Type of Discharge: Emergency Department Reason for ED Visit: Other: (dizziness) How have you been since you were released from the hospital?: Better Any questions or concerns?: No  Items Reviewed: Did you receive and understand the discharge instructions provided?: Yes Medications obtained,verified, and reconciled?: Yes (Medications Reviewed) Any new allergies since your discharge?: No Dietary orders reviewed?: Yes Do you have support at home?: Yes People in Home: spouse  Medications Reviewed Today: Medications Reviewed Today     Reviewed by Karena Addison, LPN (Licensed Practical Nurse) on 10/14/23 at 0911  Med List Status: <None>   Medication Order Taking? Sig Documenting Provider Last Dose Status Informant  acetaminophen (TYLENOL) 500 MG tablet [id number] Yes Take 500 mg by mouth every 6 (six) hours as needed for mild pain. [provider] Taking Active Self, Spouse/Significant Other  albuterol (VENTOLIN HFA) 108 (90 Base) MCG/ACT inhaler [id number] Yes Inhale 2 puffs into the lungs every 6 (six) hours as needed for wheezing or shortness of breath. Malva Limes, MD Taking Active Self, Spouse/Significant Other  apixaban (ELIQUIS) 2.5 MG TABS tablet [id number] Yes Take 1 tablet (2.5 mg total) by mouth 2 (two) times daily. Arnetha Courser, MD Taking Active Self, Spouse/Significant Other           Med Note Samella Parr Sep 10, 2023 10:27 PM) Pt states he takes half of a 5 mg tablet twice a day.  aspirin 81 MG EC tablet [id number] Yes Take 81 mg by mouth daily.  [provider] Taking  Active Self, Spouse/Significant Other           Med Note Lauretta Chester, ALEXANDRE A   Wed Jan 24, 2021  8:17 AM)    atorvastatin (LIPITOR) 40 MG tablet [id number] Yes TAKE 1 TABLET(40 MG) BY MOUTH DAILY Malva Limes, MD Taking Active Self, Spouse/Significant Other  ipratropium-albuterol (DUONEB) 0.5-2.5 (3) MG/3ML SOLN [id number]  Inhale 3 mLs into the lungs every 4 (four) hours as needed (wheezing, shob). Arnetha Courser, MD  Expired 08/29/23 2359 Self  isosorbide mononitrate (IMDUR) 30 MG 24 hr tablet [id number] Yes Take 0.5 tablets (15 mg total) by mouth daily. Loyce Dys, MD Taking Active   Magnesium 200 MG TABS [id number] Yes Take 1 tablet (200 mg total) by mouth in the morning and at bedtime. Delma Freeze, FNP Taking Active Self, Spouse/Significant Other           Med Note (PHUSA, TIFFANY A   Wed Apr 23, 2023  4:20 PM) 250 mg twice daily  metoprolol succinate (TOPROL-XL) 25 MG 24 hr tablet [id number] Yes Take 1 tablet (25 mg total) by mouth daily. Loyce Dys, MD Taking Active   pantoprazole (PROTONIX) 40 MG tablet [id number] Yes TAKE 1 TABLET BY MOUTH EVERY DAY Malva Limes, MD Taking Active Self, Spouse/Significant Other  torsemide (DEMADEX) 20 MG tablet [id number] Yes Take 1 tablet (20 mg total) by mouth daily.  Patient taking differently: Take 20 mg by mouth daily. Taking 2 tablet a day.   Clarisa Kindred A, FNP Taking Active   triamcinolone ointment (KENALOG) 0.1 % [id number] Yes APPLY TOPICALLY TWICE DAILY AS DIRECTED Malva Limes,  MD Taking Active Self, Spouse/Significant Other            Home Care and Equipment/Supplies: Were Home Health Services Ordered?: NA Any new equipment or medical supplies ordered?: NA  Functional Questionnaire: Do you need assistance with bathing/showering or dressing?: No Do you need assistance with meal preparation?: No Do you need assistance with eating?: No Do you have difficulty maintaining continence: No Do you need assistance with  getting out of bed/getting out of a chair/moving?: No Do you have difficulty managing or taking your medications?: No  Follow up appointments reviewed: PCP Follow-up appointment confirmed?: NA Specialist Hospital Follow-up appointment confirmed?: Yes Date of Specialist follow-up appointment?: 10/14/23 Follow-Up Specialty Provider:: cardio Do you need transportation to your follow-up appointment?: No Do you understand care options if your condition(s) worsen?: Yes-patient verbalized understanding    SIGNATURE Karena Addison, LPN [facility] Nurse Health Advisor Direct Dial [phone]

## 2023-10-15 DIAGNOSIS — L409 Psoriasis, unspecified: Secondary | ICD-10-CM | POA: Diagnosis not present

## 2023-10-15 DIAGNOSIS — J440 Chronic obstructive pulmonary disease with acute lower respiratory infection: Secondary | ICD-10-CM | POA: Diagnosis not present

## 2023-10-15 DIAGNOSIS — R6520 Severe sepsis without septic shock: Secondary | ICD-10-CM | POA: Diagnosis not present

## 2023-10-15 DIAGNOSIS — Z7982 Long term (current) use of aspirin: Secondary | ICD-10-CM | POA: Diagnosis not present

## 2023-10-15 DIAGNOSIS — K219 Gastro-esophageal reflux disease without esophagitis: Secondary | ICD-10-CM | POA: Diagnosis not present

## 2023-10-15 DIAGNOSIS — I4891 Unspecified atrial fibrillation: Secondary | ICD-10-CM | POA: Diagnosis not present

## 2023-10-15 DIAGNOSIS — I2510 Atherosclerotic heart disease of native coronary artery without angina pectoris: Secondary | ICD-10-CM | POA: Diagnosis not present

## 2023-10-15 DIAGNOSIS — G8929 Other chronic pain: Secondary | ICD-10-CM | POA: Diagnosis not present

## 2023-10-15 DIAGNOSIS — Z951 Presence of aortocoronary bypass graft: Secondary | ICD-10-CM | POA: Diagnosis not present

## 2023-10-15 DIAGNOSIS — I130 Hypertensive heart and chronic kidney disease with heart failure and stage 1 through stage 4 chronic kidney disease, or unspecified chronic kidney disease: Secondary | ICD-10-CM | POA: Diagnosis not present

## 2023-10-15 DIAGNOSIS — E785 Hyperlipidemia, unspecified: Secondary | ICD-10-CM | POA: Diagnosis not present

## 2023-10-15 DIAGNOSIS — I714 Abdominal aortic aneurysm, without rupture, unspecified: Secondary | ICD-10-CM | POA: Diagnosis not present

## 2023-10-15 DIAGNOSIS — H9190 Unspecified hearing loss, unspecified ear: Secondary | ICD-10-CM | POA: Diagnosis not present

## 2023-10-15 DIAGNOSIS — Z87891 Personal history of nicotine dependence: Secondary | ICD-10-CM | POA: Diagnosis not present

## 2023-10-15 DIAGNOSIS — I5020 Unspecified systolic (congestive) heart failure: Secondary | ICD-10-CM | POA: Diagnosis not present

## 2023-10-15 DIAGNOSIS — A419 Sepsis, unspecified organism: Secondary | ICD-10-CM | POA: Diagnosis not present

## 2023-10-15 DIAGNOSIS — Z8616 Personal history of COVID-19: Secondary | ICD-10-CM | POA: Diagnosis not present

## 2023-10-15 DIAGNOSIS — N184 Chronic kidney disease, stage 4 (severe): Secondary | ICD-10-CM | POA: Diagnosis not present

## 2023-10-15 DIAGNOSIS — E1122 Type 2 diabetes mellitus with diabetic chronic kidney disease: Secondary | ICD-10-CM | POA: Diagnosis not present

## 2023-10-15 DIAGNOSIS — M109 Gout, unspecified: Secondary | ICD-10-CM | POA: Diagnosis not present

## 2023-10-15 DIAGNOSIS — Z7901 Long term (current) use of anticoagulants: Secondary | ICD-10-CM | POA: Diagnosis not present

## 2023-10-18 ENCOUNTER — Other Ambulatory Visit: Payer: Self-pay | Admitting: Family Medicine

## 2023-10-18 DIAGNOSIS — L409 Psoriasis, unspecified: Secondary | ICD-10-CM

## 2023-10-20 ENCOUNTER — Ambulatory Visit (INDEPENDENT_AMBULATORY_CARE_PROVIDER_SITE_OTHER): Payer: Medicare Other | Admitting: Family Medicine

## 2023-10-20 ENCOUNTER — Encounter: Payer: Self-pay | Admitting: Family Medicine

## 2023-10-20 ENCOUNTER — Ambulatory Visit: Payer: Medicare Other | Attending: Family | Admitting: Family

## 2023-10-20 ENCOUNTER — Encounter: Payer: Self-pay | Admitting: Family

## 2023-10-20 VITALS — BP 142/83 | HR 90 | Wt 164.0 lb

## 2023-10-20 VITALS — BP 135/96 | HR 62 | Ht 70.0 in | Wt 162.0 lb

## 2023-10-20 DIAGNOSIS — E1122 Type 2 diabetes mellitus with diabetic chronic kidney disease: Secondary | ICD-10-CM | POA: Diagnosis not present

## 2023-10-20 DIAGNOSIS — K7460 Unspecified cirrhosis of liver: Secondary | ICD-10-CM | POA: Insufficient documentation

## 2023-10-20 DIAGNOSIS — Z7982 Long term (current) use of aspirin: Secondary | ICD-10-CM | POA: Insufficient documentation

## 2023-10-20 DIAGNOSIS — E876 Hypokalemia: Secondary | ICD-10-CM

## 2023-10-20 DIAGNOSIS — Z95810 Presence of automatic (implantable) cardiac defibrillator: Secondary | ICD-10-CM | POA: Insufficient documentation

## 2023-10-20 DIAGNOSIS — J811 Chronic pulmonary edema: Secondary | ICD-10-CM | POA: Insufficient documentation

## 2023-10-20 DIAGNOSIS — I4891 Unspecified atrial fibrillation: Secondary | ICD-10-CM | POA: Diagnosis not present

## 2023-10-20 DIAGNOSIS — Z951 Presence of aortocoronary bypass graft: Secondary | ICD-10-CM | POA: Insufficient documentation

## 2023-10-20 DIAGNOSIS — I480 Paroxysmal atrial fibrillation: Secondary | ICD-10-CM | POA: Diagnosis not present

## 2023-10-20 DIAGNOSIS — Z7901 Long term (current) use of anticoagulants: Secondary | ICD-10-CM | POA: Insufficient documentation

## 2023-10-20 DIAGNOSIS — I130 Hypertensive heart and chronic kidney disease with heart failure and stage 1 through stage 4 chronic kidney disease, or unspecified chronic kidney disease: Secondary | ICD-10-CM | POA: Insufficient documentation

## 2023-10-20 DIAGNOSIS — G4733 Obstructive sleep apnea (adult) (pediatric): Secondary | ICD-10-CM

## 2023-10-20 DIAGNOSIS — N184 Chronic kidney disease, stage 4 (severe): Secondary | ICD-10-CM | POA: Diagnosis not present

## 2023-10-20 DIAGNOSIS — G4730 Sleep apnea, unspecified: Secondary | ICD-10-CM | POA: Diagnosis not present

## 2023-10-20 DIAGNOSIS — I5042 Chronic combined systolic (congestive) and diastolic (congestive) heart failure: Secondary | ICD-10-CM

## 2023-10-20 DIAGNOSIS — L409 Psoriasis, unspecified: Secondary | ICD-10-CM

## 2023-10-20 DIAGNOSIS — E785 Hyperlipidemia, unspecified: Secondary | ICD-10-CM | POA: Diagnosis not present

## 2023-10-20 DIAGNOSIS — I10 Essential (primary) hypertension: Secondary | ICD-10-CM | POA: Diagnosis not present

## 2023-10-20 DIAGNOSIS — Z9981 Dependence on supplemental oxygen: Secondary | ICD-10-CM | POA: Diagnosis not present

## 2023-10-20 DIAGNOSIS — Z8616 Personal history of COVID-19: Secondary | ICD-10-CM | POA: Diagnosis not present

## 2023-10-20 DIAGNOSIS — I2510 Atherosclerotic heart disease of native coronary artery without angina pectoris: Secondary | ICD-10-CM | POA: Insufficient documentation

## 2023-10-20 DIAGNOSIS — Z9861 Coronary angioplasty status: Secondary | ICD-10-CM | POA: Diagnosis not present

## 2023-10-20 DIAGNOSIS — Z955 Presence of coronary angioplasty implant and graft: Secondary | ICD-10-CM | POA: Insufficient documentation

## 2023-10-20 DIAGNOSIS — G8929 Other chronic pain: Secondary | ICD-10-CM | POA: Diagnosis not present

## 2023-10-20 DIAGNOSIS — R0602 Shortness of breath: Secondary | ICD-10-CM | POA: Diagnosis not present

## 2023-10-20 DIAGNOSIS — N189 Chronic kidney disease, unspecified: Secondary | ICD-10-CM | POA: Insufficient documentation

## 2023-10-20 DIAGNOSIS — A419 Sepsis, unspecified organism: Secondary | ICD-10-CM | POA: Diagnosis not present

## 2023-10-20 DIAGNOSIS — Z8673 Personal history of transient ischemic attack (TIA), and cerebral infarction without residual deficits: Secondary | ICD-10-CM | POA: Diagnosis not present

## 2023-10-20 DIAGNOSIS — I714 Abdominal aortic aneurysm, without rupture, unspecified: Secondary | ICD-10-CM | POA: Diagnosis not present

## 2023-10-20 DIAGNOSIS — I5022 Chronic systolic (congestive) heart failure: Secondary | ICD-10-CM

## 2023-10-20 DIAGNOSIS — J90 Pleural effusion, not elsewhere classified: Secondary | ICD-10-CM | POA: Diagnosis not present

## 2023-10-20 DIAGNOSIS — I272 Pulmonary hypertension, unspecified: Secondary | ICD-10-CM | POA: Diagnosis not present

## 2023-10-20 DIAGNOSIS — J440 Chronic obstructive pulmonary disease with acute lower respiratory infection: Secondary | ICD-10-CM | POA: Diagnosis not present

## 2023-10-20 DIAGNOSIS — H9190 Unspecified hearing loss, unspecified ear: Secondary | ICD-10-CM | POA: Diagnosis not present

## 2023-10-20 DIAGNOSIS — K219 Gastro-esophageal reflux disease without esophagitis: Secondary | ICD-10-CM | POA: Diagnosis not present

## 2023-10-20 DIAGNOSIS — Z79899 Other long term (current) drug therapy: Secondary | ICD-10-CM | POA: Diagnosis not present

## 2023-10-20 DIAGNOSIS — R6520 Severe sepsis without septic shock: Secondary | ICD-10-CM | POA: Diagnosis not present

## 2023-10-20 DIAGNOSIS — I5020 Unspecified systolic (congestive) heart failure: Secondary | ICD-10-CM | POA: Diagnosis not present

## 2023-10-20 DIAGNOSIS — M109 Gout, unspecified: Secondary | ICD-10-CM | POA: Diagnosis not present

## 2023-10-20 DIAGNOSIS — Z87891 Personal history of nicotine dependence: Secondary | ICD-10-CM | POA: Diagnosis not present

## 2023-10-20 MED ORDER — TRIAMCINOLONE ACETONIDE 0.1 % EX OINT: TOPICAL_OINTMENT | Freq: Two times a day (BID) | CUTANEOUS | 3 refills | Status: DC

## 2023-10-20 NOTE — Patient Instructions (Signed)
Call us in the future if you need [place] for anything

## 2023-10-20 NOTE — Progress Notes (Signed)
Established patient visit   Patient: Taylor [name]   DOB: 18-Oct-1941   82 y.o. Male  MRN: [id number] Visit Date: 10/20/2023  Today's healthcare provider: Mila Merry, MD   Chief Complaint  Patient presents with   Hospitalization Follow-up    Patient seen at [facility] 11/17-11/18 due to orthostatic light headedness Patient reports things are better but still needs some improvement   Subjective    Discussed the use of AI scribe software for clinical note transcription with the patient, who gave verbal consent to proceed.  History of Present Illness   The patient, with a history of hypertension, heart failure, and chronic obstructive pulmonary disease, presents for follow-up after a recent ER visit. He reports he was feeling weak and dizzy, which led to the hospital visit. The patient notes some improvement, but still experiences weakness, albeit not severe enough to impede walking. He also reports shortness of breath, which has shown some improvement with the use of a breathing monitor. The patient's breathing capacity fluctuates, with readings ranging from 750 to 1250 on the monitor.  During the hospital stay, the patient received potassium and fluids, and was advised to adjust his potassium and magnesium intake. He has been back on half a tablet of potassium (20 milliequivalents) twice daily and half a tablet of over-the-counter magnesium (250 milligrams) twice daily since discharge.  The patient also reports some swelling in his legs and a persistent cough, which has improved compared to a few weeks ago. He still has some difficulty clearing mucus from his chest. The patient uses oxygen therapy at home, but has been experiencing issues with the equipment, specifically with refilling the oxygen tanks.  The patient also has a history of sleep apnea and was previously using a CPAP machine. However, he stopped using the machine after a fall in October, which coincided with a cessation of  snoring. The patient has been monitoring his oxygen levels at night and reports that he can maintain a saturation of 100% while at rest, but requires supplemental oxygen with activity.       Medications: Outpatient Medications Prior to Visit  Medication Sig   acetaminophen (TYLENOL) 500 MG tablet Take 500 mg by mouth every 6 (six) hours as needed for mild pain.   albuterol (VENTOLIN HFA) 108 (90 Base) MCG/ACT inhaler Inhale 2 puffs into the lungs every 6 (six) hours as needed for wheezing or shortness of breath.   apixaban (ELIQUIS) 2.5 MG TABS tablet Take 1 tablet (2.5 mg total) by mouth 2 (two) times daily.   aspirin 81 MG EC tablet Take 81 mg by mouth daily.    atorvastatin (LIPITOR) 40 MG tablet TAKE 1 TABLET(40 MG) BY MOUTH DAILY   empagliflozin (JARDIANCE) 10 MG TABS tablet Take 1 tablet by mouth daily.   isosorbide mononitrate (IMDUR) 30 MG 24 hr tablet Take 0.5 tablets (15 mg total) by mouth daily.   Magnesium 250 MG TABS Take 125 mg by mouth 2 (two) times daily.   pantoprazole (PROTONIX) 40 MG tablet TAKE 1 TABLET BY MOUTH EVERY DAY   potassium chloride SA (KLOR-CON M) 20 MEQ tablet Take 10 mEq by mouth 2 (two) times daily.   torsemide (DEMADEX) 20 MG tablet Take 1 tablet (20 mg total) by mouth daily. (Patient taking differently: Take 20 mg by mouth daily. Taking 2 tablet a day.)   [DISCONTINUED] Magnesium 200 MG TABS Take 1 tablet (200 mg total) by mouth in the morning and at bedtime.   [  DISCONTINUED] triamcinolone ointment (KENALOG) 0.1 % APPLY TOPICALLY TWICE DAILY AS DIRECTED   ipratropium-albuterol (DUONEB) 0.5-2.5 (3) MG/3ML SOLN Inhale 3 mLs into the lungs every 4 (four) hours as needed (wheezing, shob).   metoprolol succinate (TOPROL-XL) 25 MG 24 hr tablet Take 1 tablet (25 mg total) by mouth daily. (Patient not taking: Reported on 10/20/2023)   No facility-administered medications prior to visit.   Review of Systems  Constitutional:  Negative for appetite change, chills  and fever.  Respiratory:  Negative for chest tightness, shortness of breath and wheezing.   Cardiovascular:  Negative for chest pain and palpitations.  Gastrointestinal:  Negative for abdominal pain, nausea and vomiting.        Objective    BP (!) 135/96 (BP Location: Right Arm, Patient Position: Sitting, Cuff Size: Normal)   Pulse 62   Ht 5\' 10"  (1.778 m)   Wt 162 lb (73.5 kg)   SpO2 98%   BMI 23.24 kg/m   Physical Exam   General: Appearance:    Well developed, well nourished male in no acute distress  Eyes:    PERRL, conjunctiva/corneas clear, EOM's intact       Lungs:     Clear to auscultation bilaterally, respirations unlabored  Heart:    Normal heart rate. Irregularly irregular rhythm.  2/6 blowing, holosystolic murmur at apex   MS:   All extremities are intact.    Neurologic:   Awake, alert, oriented x 3. No apparent focal neurological defect.         Assessment & Plan        CHF Stable on current medications. Has follow up CHF clinic this afternoon. Anticipate potassium and magnesium will be checked there. If not can return to our lab to have checked. . -Continue current management.  Hypokalemia Has resumed taking half a tablet of Potassium 20 milliequivalents twice daily. -Continue current potassium supplementation. -Check potassium levels at next appointment with Clarisa Kindred at the heart failure clinic.  Hypomagnesemia Patient has adjusted Magnesium supplementation to half a tablet of Magnesium 250mg  twice daily. -Continue current magnesium supplementation.  Respiratory Patient reports improvement in breathing and coughing since hospitalization for pneumonia in October., but still has some residual issues. Using oxygen therapy and has a portable oxygen concentrator. -Continue current management.  CPAP use Patient has stopped using CPAP machine after a fall in October, reports no longer snoring loudly. -Continue to monitor sleep quality and breathing.  Continue nocturnal oxygen.   Triamcinolone Patient reported needing a refill for Triamcinolone.    No follow-ups on file.      Mila Merry, MD  [facility] [phone] (phone) [phone] (fax)  [facility]

## 2023-10-20 NOTE — Patient Instructions (Signed)
.   Please review the attached list of medications and notify my office if there are any errors.   . Please bring all of your medications to every appointment so we can make sure that our medication list is the same as yours.   

## 2023-10-20 NOTE — Progress Notes (Signed)
PCP: Mila Merry, MD (last seen 11/24) Primary Cardiologist: Marcina Millard, MD (last seen 09/24; returns 01/25) ADHF provider: Florestine Avers, MD (last seen 11/24)   HPI:  Taylor [name] is a 82 y/o male with a history of AAA, bladder cancer, DM, atrial fibrillation, CAD s/p DES, hyperlipidemia, CKD, restrictive lung disease,HTN, stroke, liver cirrhosis, AICD, GERD, melanoma, pancreatitis, sleep apnea, VF and chronic heart failure. Had paracentesis done 07/29/23 with removal of 4.8L. Previous paracentesis done 04/24/23 with removal of 6.8L  Was in the ED 10/27/22 due to nausea epigastric discomfort some shortness of breath. Admitted 10/30/22 due to DOE and orthopnea x 7 days. Chest xray showed cardiomegaly and central vascular congestion. Cardiology & nephrology consults obtained. Given IV lasix. Cr worsened with diuretics so discontinued and put on gentle fluids in setting of HF. Placed on oxygen. Discharged after 3 days.   Admitted 02/25/23 due to worsening SOB due to acute/ chronic HF. Placed on oxygen. Chest x-ray with enlargement of cardiac silhouette and mild pulmonary edema. CT of the chest abdomen pelvis with moderate to severe ascites with anasarca. Paracentesis yielded 6L fluids. Jardiance held due to AKI.    Admitted 04/03/23 due to worsening lower extremity swelling, abdominal distention, shortness of breath and productive cough for last few days. Had not been taking diuretic per cardiology. Needed 4L supplemental oxygen. Chest x-ray shows cardiomegaly with mild to moderate pulmonary edema and bilateral pleural effusion. CT abdomen shows moderate ascites with anasarca, nodular liver and severe atrophy of left kidney. IV diuresed. Underwent ultrasound-guided paracentesis 6.6 L of clear fluid drained. Patient was continued on IV Lasix. Admitted 04/23/23 due to weight gain, shortness of breath, and falling at [facility] hardware store. IV diuresed. S/p paracentesis with removal of 6.8 L of amber-colored  fluid. Placed back on oral diuretics.   Was in the ED 08/29/23 due to mechanical fall. Stumbled outside over his feet and fell on his face on the concrete. No loss of consciousness. Facial fracture, rad styloid fx. Hematoma around left eye. Admitted 09/10/23 due to weakness with resultant 3 falls day of admission. Found to be (+)sepsis, severe - hypothermic Tmin 4F, low BP 80s/60s, AKI, hyperkalemic K max 6.8 improved w/ tx, Cr on admission 5.46 above baseline 2.7, lactic max 3.8. CT (+)PNA RML and LLL, mod R pleural effusion also noted on previous CT. Tx midodrine, steroids, fluids, vanc/cefepime, needing pressors. Echo LVEF <20%, severely decreased fxn and global hypokinesis, grade III diastolic dysfunction, severe RV overload, severe RA and LA dilation.  Cardiology consulted. Palliative consulted. Renal function improved. PT/OT and discharged to SNF.    Was in the ED 10/12/23 due to orthostatic lightheadedness with decreased oral intake. Potassium replenished.    Echo 03/11/17: EF 25% with mild LVH, severe LAE, moderate Taylor  Echo 12/28/2018: EF 20% with mild LVH, moderate Taylor, mild AR/TR/PR, mild pHTN Echo 11/13/19: EF 20-25% with mild LAE/RAE and moderate Taylor/TR Echo 08/10/20: EF 35-40% along with mild Taylor and borderline dilatation of aortic root at 39 mm Echo 10/30/22: EF of <20% along with mild LVH, severe LAE, severe Taylor, mild AR and moderate/severe TR.  TEE 02/17/23: EF 20% with moderate Taylor, mild AR, moderate TR, no thrombus Echo 09/11/23: EF <20% with mild LVH, Grade III DD, severe RV pressure, severe LAE/RAE, moderate/severe Taylor, severe TR Echo 09/23/23: EF 25% with moderate LVH, mild AR/ PR, moderate Taylor/ TR  Stress test 05/07/21: Moderate to severe LV systolic dysfunction  Large perfusion abnormality of severe intensity in the anterior  apical  myocardial perfusion distribution consistent with previous infarct and/or  scar without evidence of myocardial ischemia   RHC/LHC 10/02/12: Left Main:   normal       LAD system:  significant       LCX system:  significant       RCA system:  normal       Number of vessels with significant CAD:  2 - Vessel   Left Ventriculogram       Ejection Fraction:   65%   He presents today for a HF follow-up visit with a chief complaint of moderate shortness of breath with minimal exertion. Has associated fatigue and slight pedal edema along with this. Denies chest pain, cough, palpitations, abdominal distention, dizziness, weight gain or difficulty sleeping. Reports sleeping well on 1-2 pillows. Wearing oxygen at 2.5L with exertion/ bedtime.   ROS: All systems negative except as listed in HPI, PMH and Problem List.  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  Independent at baseline.   Social Determinants of Health   Financial Resource Strain: Low Risk  (05/12/2023)   Overall Financial Resource Strain (CARDIA)    Difficulty of Paying Living Expenses: Not hard at all  Food Insecurity: No Food Insecurity (05/12/2023)   Hunger Vital Sign    Worried About Running Out of Food in the Last Year: Never true    Ran Out of Food in the Last Year: Never true  Transportation Needs: No Transportation Needs (05/12/2023)   PRAPARE -  Administrator, Civil Service (Medical): No    Lack of Transportation (Non-Medical): No  Physical Activity: Insufficiently Active (05/12/2023)   Exercise Vital Sign    Days of Exercise per Week: 4 days    Minutes of Exercise per Session: 30 min  Stress: Stress Concern Present (05/12/2023)   [employer] of Occupational Health - Occupational Stress Questionnaire    Feeling of Stress : To some extent  Social Connections: Moderately Isolated (05/12/2023)   Social Connection and Isolation Panel [NHANES]    Frequency of Communication with Friends and Family: Once a week    Frequency of Social Gatherings with Friends and Family: Once a week    Attends Religious Services: More than 4 times per year    Active Member of [employer] or Organizations: No    Attends Banker Meetings: Never    Marital Status: Married  Catering manager Violence: Not At Risk (05/12/2023)   Humiliation, Afraid, Rape, and Kick questionnaire    Fear of Current or Ex-Partner: No    Emotionally Abused: No  Physically Abused: No    Sexually Abused: No    FH:  Family History  Problem Relation Age of Onset   Cancer Mother        Melanoma skin cancer   Heart attack Father 43   Cancer Father        throat cancer   Arthritis Brother     Past Medical History:  Diagnosis Date   AAA (abdominal aortic aneurysm) (HCC) 06/03/2007   [facility]; Dr. Hart [place]   AICD (automatic cardioverter/defibrillator) present    Arrhythmia    atrial fibrillation   Barrett's esophagus    Bladder cancer (HCC)    Bradycardia    CAD (coronary artery disease)    CAP (community acquired pneumonia) 11/13/2019   CHF (congestive heart failure) (HCC)    Cluster headache    COVID-19 09/27/2021   DDD (degenerative disc disease), lumbar    Diabetes mellitus without complication (HCC)    Dyspnea    WITH EXERTION   Edema    LEFT ANKLE   Fracture of skull base (HCC) 1997   due to fall   GERD  (gastroesophageal reflux disease)    Gout    History of bladder cancer 12/1995   Hyperlipidemia    Hypertension    Hypocalcemia 04/19/2020   Possibly secondary to diuretics.   Low=5.9 04/19/2020   Malignant melanoma (HCC) 12/2012   right dorsal forearm excised   Myocardial infarction [facility])    LAST 2014   Osteoarthritis of knee    Other specified complication of vascular prosthetic devices, implants and grafts, initial encounter (HCC) 08/22/2021   Pacemaker 10/10/2006   Pancreatitis 11/22/2019   Pneumonia    2016   Pre-diabetes    Psoriasis    Rib fracture 1997   due to fall   Sleep apnea    CPAP   Stroke (HCC)    Venous incompetence     Current Outpatient Medications  Medication Sig Dispense Refill   acetaminophen (TYLENOL) 500 MG tablet Take 500 mg by mouth every 6 (six) hours as needed for mild pain.     albuterol (VENTOLIN HFA) 108 (90 Base) MCG/ACT inhaler Inhale 2 puffs into the lungs every 6 (six) hours as needed for wheezing or shortness of breath. 8 g 2   apixaban (ELIQUIS) 2.5 MG TABS tablet Take 1 tablet (2.5 mg total) by mouth 2 (two) times daily. 60 tablet 1   aspirin 81 MG EC tablet Take 81 mg by mouth daily.      atorvastatin (LIPITOR) 40 MG tablet TAKE 1 TABLET(40 MG) BY MOUTH DAILY 90 tablet 1   ipratropium-albuterol (DUONEB) 0.5-2.5 (3) MG/3ML SOLN Inhale 3 mLs into the lungs every 4 (four) hours as needed (wheezing, shob). 360 mL 1   isosorbide mononitrate (IMDUR) 30 MG 24 hr tablet Take 0.5 tablets (15 mg total) by mouth daily.     Magnesium 200 MG TABS Take 1 tablet (200 mg total) by mouth in the morning and at bedtime. 60 tablet 5   metoprolol succinate (TOPROL-XL) 25 MG 24 hr tablet Take 1 tablet (25 mg total) by mouth daily.     pantoprazole (PROTONIX) 40 MG tablet TAKE 1 TABLET BY MOUTH EVERY DAY 90 tablet 4   torsemide (DEMADEX) 20 MG tablet Take 1 tablet (20 mg total) by mouth daily. (Patient taking differently: Take 20 mg by mouth daily. Taking 2  tablet a day.) 90 tablet 3   triamcinolone ointment (KENALOG) 0.1 % APPLY TOPICALLY TWICE DAILY  AS DIRECTED 454 g 3   No current facility-administered medications for this visit.   Vitals:   10/20/23 1339  BP: (!) 142/83  Pulse: 90  SpO2: 95%  Weight: 164 lb (74.4 kg)   Wt Readings from Last 3 Encounters:  10/20/23 164 lb (74.4 kg)  10/20/23 162 lb (73.5 kg)  10/12/23 159 lb (72.1 kg)   Lab Results  Component Value Date   CREATININE 2.14 (H) 10/12/2023   CREATININE 1.93 (H) 09/19/2023   CREATININE 2.05 (H) 09/17/2023    PHYSICAL EXAM:  General:  Well appearing. No resp difficulty HEENT: normal Neck: supple. JVP normal No lymphadenopathy or thryomegaly appreciated. Cor: PMI normal. Regular rhythm & rate. No rubs, gallops or murmurs. Lungs: clear Abdomen: soft, nontender, nondistended. No hepatosplenomegaly. No bruits or masses. Good bowel sounds. Extremities: no cyanosis, clubbing, rash, edema Neuro: alert & oriented x3, cranial nerves grossly intact. Moves all 4 extremities w/o difficulty. Affect pleasant.   ECG: not done    ASSESSMENT & PLAN:  1: Ischemic heart failure with reduced ejection fraction- - NYHA class III - euvolemic - weighing daily; reminded to call for overnight weight gain of > 2 pounds or a weekly weight gain of > 5 pounds - weight down 7 pounds from last visit here 1 month ago - Stress test 05/07/21 showed moderate to severe LV systolic dysfunction, large perfusion abnormality of severe intensity in the anterior apical myocardial perfusion distribution consistent with previous infarct and/or scar without evidence of myocardial ischemia  - Echo 03/11/17: EF 25% with mild LVH, severe LAE, moderate Taylor  - Echo 12/28/2018: EF 20% with mild LVH, moderate Taylor, mild AR/TR/PR, mild pHTN - Echo 11/13/19: EF 20-25% with mild LAE/RAE and moderate Taylor/TR - Echo 08/10/20: EF 35-40% along with mild Taylor and borderline dilatation of aortic root at 39 mm - Echo 10/30/22:  EF of <20% along with mild LVH, severe LAE, severe Taylor, mild AR and moderate/severe TR.  - TEE 02/17/23: EF 20% with moderate Taylor, mild AR, moderate TR, no thrombus - Echo 09/11/23: EF <20% with mild LVH, Grade III DD, severe RV pressure, severe LAE/RAE, moderate/severe Taylor, severe TR - Echo 09/23/23: EF 25% with moderate LVH, mild AR/ PR, moderate Taylor/ TR - continue metoprolol succinate 25mg  BID - continue torsemide 20mg  daily - saw [facility] Allena Katz) 11/24 - has AICD - had paracentesis on 07/29/23 with removal of 4.8L (has a history of portal HTN) - previous paracentesis done 04/24/23 with removal of 6.8L - not adding salt to his food - Mg 10/12/23 was 1.8 - BNP 09/10/23 was 647.1  2: HTN- - BP 142/83 - saw PCP Sherrie Mustache) 11/24 - BMP 10/14/23 reviewed and showed sodium 142, potassium 3.8, creatinine 2.3 and GFR 28 - saw nephrology Suezanne Jacquet) 11/24  3: Atrial fibrillation- - saw cardiology (Paraschos) 09/24 - continue apixaban 2.5mg  BID - continue ASA 81mg  daily - has pacemaker with OptiVol capacity for fluid monitoring - battery change out done 02/13/23  4: CAD- - CABG 2007 - continue atorvastatin 40mg  daily - continue isosorbide MN 15mg  daily - RHC/LHC done 10/02/12:      Left Main:  normal       LAD system:  significant       LCX system:  significant       RCA system:  normal       Left Ventriculogram Ejection Fraction: 65%   5: DM with CKD-  - A1c 09/30/23 was 6.2%  6: Sleep apnea- - saw  pulmonology Karna Christmas) 03/24 - wearing oxygen at 2L with his CPAP  Due to patient following with [facility], advised that he didn't need to return to the [facility] HF clinic. Explained that he could return for any questions or if he needed to be seen and [facility] couldn't see him. He was comfortable with this plan.

## 2023-10-20 NOTE — Telephone Encounter (Signed)
Requested medications are due for refill today.  no  Requested medications are on the active medications list.  yes  Last refill. 10/20/2023   Future visit scheduled.   yes  Notes to clinic.  Refill/refusal not delegated.    Requested Prescriptions  Pending Prescriptions Disp Refills   triamcinolone ointment (KENALOG) 0.1 % [Pharmacy Med Name: TRIAMCINOLONE 0.1% OINTMENT 454GM] 454 g 3    Sig: APPLY TOPICALLY TO THE AFFECTED AREA TWICE DAILY AS DIRECTED     Not Delegated - Dermatology:  Corticosteroids Failed - 10/18/2023 11:33 AM      Failed - This refill cannot be delegated      Passed - Valid encounter within last 12 months    Recent Outpatient Visits           Today Hypokalemia   [place] [facility] Malva Limes, MD   2 weeks ago Acute cough   [place] [facility] Malva Limes, MD   2 months ago Type 2 diabetes mellitus with stage 4 chronic kidney disease, without long-term current use of insulin (HCC)   [place] [facility] Malva Limes, MD   3 months ago Idiopathic chronic gout of left foot without tophus   [place] [facility] [place], [place], MD   5 months ago Acute on chronic combined systolic and diastolic congestive heart failure [facility])   [place] [facility] Malva Limes, MD       Future Appointments             In 1 month [name], Demetrios Isaacs, MD [facility]   In 6 months Deirdre Evener, MD [facility]

## 2023-10-21 ENCOUNTER — Encounter: Payer: Medicare Other | Admitting: Family

## 2023-10-22 ENCOUNTER — Other Ambulatory Visit: Payer: Self-pay

## 2023-10-22 NOTE — Patient Outreach (Signed)
  Care Management   Visit Note  10/22/2023 Name: Taylor [name] MRN: [id number] DOB: 29-May-1941  Subjective: Taylor [name] is a 82 y.o. year old male who is a primary care patient of [name], Demetrios Isaacs, MD. The Care Management team was consulted for assistance.      Engaged with patient via telephone.  Assessment:  Outpatient Encounter Medications as of 10/22/2023  Medication Sig Note   acetaminophen (TYLENOL) 500 MG tablet Take 500 mg by mouth every 6 (six) hours as needed for mild pain.    albuterol (VENTOLIN HFA) 108 (90 Base) MCG/ACT inhaler Inhale 2 puffs into the lungs every 6 (six) hours as needed for wheezing or shortness of breath.    apixaban (ELIQUIS) 2.5 MG TABS tablet Take 1 tablet (2.5 mg total) by mouth 2 (two) times daily. 09/10/2023: Pt states he takes half of a 5 mg tablet twice a day.   aspirin 81 MG EC tablet Take 81 mg by mouth daily.     atorvastatin (LIPITOR) 40 MG tablet TAKE 1 TABLET(40 MG) BY MOUTH DAILY    empagliflozin (JARDIANCE) 10 MG TABS tablet Take 1 tablet by mouth daily.    isosorbide mononitrate (IMDUR) 30 MG 24 hr tablet Take 0.5 tablets (15 mg total) by mouth daily.    Magnesium 250 MG TABS Take 125 mg by mouth 2 (two) times daily. 10/22/2023: Reports taking half of a tablet in the morning and half of a tablet at night   pantoprazole (PROTONIX) 40 MG tablet TAKE 1 TABLET BY MOUTH EVERY DAY    potassium chloride SA (KLOR-CON M) 20 MEQ tablet Take 10 mEq by mouth 2 (two) times daily.    torsemide (DEMADEX) 20 MG tablet Take 1 tablet (20 mg total) by mouth daily. (Patient taking differently: Take 20 mg by mouth daily. Taking 2 tablet a day.) 10/22/2023: Reports taking 2 tablets daily   triamcinolone ointment (KENALOG) 0.1 % Apply topically 2 (two) times daily. APPLY TOPICALLY TWICE DAILY AS DIRECTED    ipratropium-albuterol (DUONEB) 0.5-2.5 (3) MG/3ML SOLN Inhale 3 mLs into the lungs every 4 (four) hours as needed (wheezing, shob).    metoprolol succinate  (TOPROL-XL) 25 MG 24 hr tablet Take 1 tablet (25 mg total) by mouth daily. (Patient not taking: Reported on 10/20/2023) 10/22/2023: Patient not taking. Reports medication was discontinued by provider   No facility-administered encounter medications on file as of 10/22/2023.

## 2023-10-27 ENCOUNTER — Telehealth (INDEPENDENT_AMBULATORY_CARE_PROVIDER_SITE_OTHER): Payer: Medicare Other | Admitting: Family Medicine

## 2023-10-27 ENCOUNTER — Ambulatory Visit: Payer: Self-pay

## 2023-10-27 DIAGNOSIS — J019 Acute sinusitis, unspecified: Secondary | ICD-10-CM

## 2023-10-27 DIAGNOSIS — J0110 Acute frontal sinusitis, unspecified: Secondary | ICD-10-CM

## 2023-10-27 MED ORDER — AMOXICILLIN-POT CLAVULANATE 875-125 MG PO TABS: 1.0000 | ORAL_TABLET | Freq: Two times a day (BID) | ORAL | 0 refills | Status: AC

## 2023-10-27 MED ORDER — FLUTICASONE PROPIONATE 50 MCG/ACT NA SUSP: 2.0000 | Freq: Every day | NASAL | 6 refills | Status: DC

## 2023-10-27 NOTE — Progress Notes (Signed)
Virtual telephone visit    Virtual Visit via Telephone Note   This format is felt to be most appropriate for this patient at this time. The patient did not have access to video technology or had technical difficulties with video requiring transitioning to audio format only (telephone). Physical exam was limited to content and character of the telephone converstion.    Patient location: [facility]fp Provider location: home  I discussed the limitations of evaluation and management by telemedicine and the availability of in person appointments. The patient expressed understanding and agreed to proceed.   Visit Date: 10/27/2023  Today's healthcare provider: Mila Merry, MD    Subjective    HPI  Discussed the use of AI scribe software for clinical note transcription with the patient, who gave verbal consent to proceed.  History of Present Illness   Taylor [name], a patient with a history of hypertension and cardiac rhythm abnormalities, presents with a new onset of severe sinus pain that started on Saturday. The pain was initially localized to the right eyebrow but spread to involve both eyebrows by the morning of the consultation. The patient describes the pain as 'like a knot.'  The patient self-medicated with Dayquil, which significantly relieved the pain. However, the patient still experiences some residual discomfort in the right eyebrow, which is only noticeable upon rubbing the area.  In addition to the sinus pain, the patient has been using Afrin nasal spray for an unspecified condition. Despite the use of Afrin, the sinus pain persisted and worsened over time. The patient has no prior history of sinus infections.  The patient completed a course of an unspecified antibiotic a week prior to the current consultation. The reason for the antibiotic use and its effectiveness are not specified.         Medications: Outpatient Medications Prior to Visit  Medication Sig   acetaminophen  (TYLENOL) 500 MG tablet Take 500 mg by mouth every 6 (six) hours as needed for mild pain.   albuterol (VENTOLIN HFA) 108 (90 Base) MCG/ACT inhaler Inhale 2 puffs into the lungs every 6 (six) hours as needed for wheezing or shortness of breath.   apixaban (ELIQUIS) 2.5 MG TABS tablet Take 1 tablet (2.5 mg total) by mouth 2 (two) times daily.   aspirin 81 MG EC tablet Take 81 mg by mouth daily.    atorvastatin (LIPITOR) 40 MG tablet TAKE 1 TABLET(40 MG) BY MOUTH DAILY   empagliflozin (JARDIANCE) 10 MG TABS tablet Take 1 tablet by mouth daily.   guaiFENesin (MUCINEX PO) Take by mouth.   ipratropium-albuterol (DUONEB) 0.5-2.5 (3) MG/3ML SOLN Inhale 3 mLs into the lungs every 4 (four) hours as needed (wheezing, shob).   isosorbide mononitrate (IMDUR) 30 MG 24 hr tablet Take 0.5 tablets (15 mg total) by mouth daily.   Magnesium 250 MG TABS Take 125 mg by mouth 2 (two) times daily.   metoprolol succinate (TOPROL-XL) 25 MG 24 hr tablet Take 1 tablet (25 mg total) by mouth daily. (Patient not taking: Reported on 10/20/2023)   pantoprazole (PROTONIX) 40 MG tablet TAKE 1 TABLET BY MOUTH EVERY DAY   potassium chloride SA (KLOR-CON M) 20 MEQ tablet Take 10 mEq by mouth 2 (two) times daily.   torsemide (DEMADEX) 20 MG tablet Take 1 tablet (20 mg total) by mouth daily. (Patient taking differently: Take 20 mg by mouth daily. Taking 2 tablet a day.)   triamcinolone ointment (KENALOG) 0.1 % Apply topically 2 (two) times daily. APPLY TOPICALLY TWICE  DAILY AS DIRECTED   No facility-administered medications prior to visit.     Objective    There were no vitals taken for this visit.   Awake, alert, oriented x 3. In no apparent distress   Assessment & Plan       Sinusitis Severe sinus pain since Saturday, improved with Dayquil. Noted that Dayquil may affect blood pressure and heart rhythm. First episode of sinusitis. -Prescribe antibiotic effective for sinusitis. -Prescribe once-daily nasal spray for  inflammation. -Continue Afrin as needed for symptomatic relief. -Avoid Dayquil due to potential cardiac effects.        I discussed the assessment and treatment plan with the patient. The patient was provided an opportunity to ask questions and all were answered. The patient agreed with the plan and demonstrated an understanding of the instructions.   The patient was advised to call back or seek an in-person evaluation if the symptoms worsen or if the condition fails to improve as anticipated.  I provided 12 minutes of non-face-to-face time during this encounter.    Mila Merry, MD [facility] [phone] (phone) [phone] (fax)  [facility]

## 2023-10-27 NOTE — Telephone Encounter (Signed)
  Chief Complaint: Sinus pain pressure above eyebrows. Symptoms: sinus congestion Frequency: Saturday Pertinent Negatives: Patient denies sob, lung issues Disposition: [] ED /[] Urgent Care (no appt availability in office) / [x] Appointment(In office/virtual)/ []  [place] [facility]/ [] Home Care/ [] Refused Recommended Disposition /[] [facility]/ []  Follow-up with PCP Additional Notes: Pt states that he recently recovered from pneumonia, now has sinus pain pressure  above both eye brows. Pt rates pain as severe. VV with Dr. Sherrie Mustache today.  Reason for Disposition  [1] Sinus pain (not just congestion) AND [2] fever  Answer Assessment - Initial Assessment Questions 1. LOCATION: "Where does it hurt?"      Above eye brows 2. ONSET: "When did the sinus pain start?"  (e.g., hours, days)      saturday 3. SEVERITY: "How bad is the pain?"   (Scale 1-10; mild, moderate or severe)   - MILD (1-3): doesn't interfere with normal activities    - MODERATE (4-7): interferes with normal activities (e.g., work or school) or awakens from sleep   - SEVERE (8-10): excruciating pain and patient unable to do any normal activities        severe 4. RECURRENT SYMPTOM: "Have you ever had sinus problems before?" If Yes, ask: "When was the last time?" and "What happened that time?"      yes 5. NASAL CONGESTION: "Is the nose blocked?" If Yes, ask: "Can you open it or must you breathe through your mouth?"     yes 6. NASAL DISCHARGE: "Do you have discharge from your nose?" If so ask, "What color?"     yes 7. FEVER: "Do you have a fever?" If Yes, ask: "What is it, how was it measured, and when did it start?"      unsure 8. OTHER SYMPTOMS: "Do you have any other symptoms?" (e.g., sore throat, cough, earache, difficulty breathing)     Cough, above eye pain  Protocols used: Sinus Pain or Congestion-A-AH

## 2023-10-28 DIAGNOSIS — G8929 Other chronic pain: Secondary | ICD-10-CM | POA: Diagnosis not present

## 2023-10-28 DIAGNOSIS — K219 Gastro-esophageal reflux disease without esophagitis: Secondary | ICD-10-CM | POA: Diagnosis not present

## 2023-10-28 DIAGNOSIS — J440 Chronic obstructive pulmonary disease with acute lower respiratory infection: Secondary | ICD-10-CM | POA: Diagnosis not present

## 2023-10-28 DIAGNOSIS — Z951 Presence of aortocoronary bypass graft: Secondary | ICD-10-CM | POA: Diagnosis not present

## 2023-10-28 DIAGNOSIS — Z7982 Long term (current) use of aspirin: Secondary | ICD-10-CM | POA: Diagnosis not present

## 2023-10-28 DIAGNOSIS — R6520 Severe sepsis without septic shock: Secondary | ICD-10-CM | POA: Diagnosis not present

## 2023-10-28 DIAGNOSIS — Z87891 Personal history of nicotine dependence: Secondary | ICD-10-CM | POA: Diagnosis not present

## 2023-10-28 DIAGNOSIS — E1122 Type 2 diabetes mellitus with diabetic chronic kidney disease: Secondary | ICD-10-CM | POA: Diagnosis not present

## 2023-10-28 DIAGNOSIS — I5020 Unspecified systolic (congestive) heart failure: Secondary | ICD-10-CM | POA: Diagnosis not present

## 2023-10-28 DIAGNOSIS — N184 Chronic kidney disease, stage 4 (severe): Secondary | ICD-10-CM | POA: Diagnosis not present

## 2023-10-28 DIAGNOSIS — E785 Hyperlipidemia, unspecified: Secondary | ICD-10-CM | POA: Diagnosis not present

## 2023-10-28 DIAGNOSIS — Z7901 Long term (current) use of anticoagulants: Secondary | ICD-10-CM | POA: Diagnosis not present

## 2023-10-28 DIAGNOSIS — I130 Hypertensive heart and chronic kidney disease with heart failure and stage 1 through stage 4 chronic kidney disease, or unspecified chronic kidney disease: Secondary | ICD-10-CM | POA: Diagnosis not present

## 2023-10-28 DIAGNOSIS — A419 Sepsis, unspecified organism: Secondary | ICD-10-CM | POA: Diagnosis not present

## 2023-10-28 DIAGNOSIS — I4891 Unspecified atrial fibrillation: Secondary | ICD-10-CM | POA: Diagnosis not present

## 2023-10-28 DIAGNOSIS — Z8616 Personal history of COVID-19: Secondary | ICD-10-CM | POA: Diagnosis not present

## 2023-10-28 DIAGNOSIS — I2510 Atherosclerotic heart disease of native coronary artery without angina pectoris: Secondary | ICD-10-CM | POA: Diagnosis not present

## 2023-10-28 DIAGNOSIS — H9190 Unspecified hearing loss, unspecified ear: Secondary | ICD-10-CM | POA: Diagnosis not present

## 2023-10-28 DIAGNOSIS — L409 Psoriasis, unspecified: Secondary | ICD-10-CM | POA: Diagnosis not present

## 2023-10-28 DIAGNOSIS — M109 Gout, unspecified: Secondary | ICD-10-CM | POA: Diagnosis not present

## 2023-10-28 DIAGNOSIS — I714 Abdominal aortic aneurysm, without rupture, unspecified: Secondary | ICD-10-CM | POA: Diagnosis not present

## 2023-11-03 ENCOUNTER — Ambulatory Visit: Payer: Self-pay | Admitting: *Deleted

## 2023-11-03 DIAGNOSIS — I5020 Unspecified systolic (congestive) heart failure: Secondary | ICD-10-CM | POA: Diagnosis not present

## 2023-11-03 DIAGNOSIS — Z951 Presence of aortocoronary bypass graft: Secondary | ICD-10-CM | POA: Diagnosis not present

## 2023-11-03 DIAGNOSIS — G8929 Other chronic pain: Secondary | ICD-10-CM | POA: Diagnosis not present

## 2023-11-03 DIAGNOSIS — I714 Abdominal aortic aneurysm, without rupture, unspecified: Secondary | ICD-10-CM | POA: Diagnosis not present

## 2023-11-03 DIAGNOSIS — E785 Hyperlipidemia, unspecified: Secondary | ICD-10-CM | POA: Diagnosis not present

## 2023-11-03 DIAGNOSIS — J440 Chronic obstructive pulmonary disease with acute lower respiratory infection: Secondary | ICD-10-CM | POA: Diagnosis not present

## 2023-11-03 DIAGNOSIS — A419 Sepsis, unspecified organism: Secondary | ICD-10-CM | POA: Diagnosis not present

## 2023-11-03 DIAGNOSIS — K219 Gastro-esophageal reflux disease without esophagitis: Secondary | ICD-10-CM | POA: Diagnosis not present

## 2023-11-03 DIAGNOSIS — I130 Hypertensive heart and chronic kidney disease with heart failure and stage 1 through stage 4 chronic kidney disease, or unspecified chronic kidney disease: Secondary | ICD-10-CM | POA: Diagnosis not present

## 2023-11-03 DIAGNOSIS — R6520 Severe sepsis without septic shock: Secondary | ICD-10-CM | POA: Diagnosis not present

## 2023-11-03 DIAGNOSIS — M109 Gout, unspecified: Secondary | ICD-10-CM | POA: Diagnosis not present

## 2023-11-03 DIAGNOSIS — Z87891 Personal history of nicotine dependence: Secondary | ICD-10-CM | POA: Diagnosis not present

## 2023-11-03 DIAGNOSIS — E1122 Type 2 diabetes mellitus with diabetic chronic kidney disease: Secondary | ICD-10-CM | POA: Diagnosis not present

## 2023-11-03 DIAGNOSIS — Z8616 Personal history of COVID-19: Secondary | ICD-10-CM | POA: Diagnosis not present

## 2023-11-03 DIAGNOSIS — I2510 Atherosclerotic heart disease of native coronary artery without angina pectoris: Secondary | ICD-10-CM | POA: Diagnosis not present

## 2023-11-03 DIAGNOSIS — H9190 Unspecified hearing loss, unspecified ear: Secondary | ICD-10-CM | POA: Diagnosis not present

## 2023-11-03 DIAGNOSIS — Z7901 Long term (current) use of anticoagulants: Secondary | ICD-10-CM | POA: Diagnosis not present

## 2023-11-03 DIAGNOSIS — L409 Psoriasis, unspecified: Secondary | ICD-10-CM | POA: Diagnosis not present

## 2023-11-03 DIAGNOSIS — I4891 Unspecified atrial fibrillation: Secondary | ICD-10-CM | POA: Diagnosis not present

## 2023-11-03 DIAGNOSIS — N184 Chronic kidney disease, stage 4 (severe): Secondary | ICD-10-CM | POA: Diagnosis not present

## 2023-11-03 DIAGNOSIS — Z7982 Long term (current) use of aspirin: Secondary | ICD-10-CM | POA: Diagnosis not present

## 2023-11-03 NOTE — Telephone Encounter (Signed)
Reason for Disposition  Small bruise is present  Answer Assessment - Initial Assessment Questions 1. MECHANISM: "How did the fall happen?"     Jerrye Beavers, PT with [facility] calling in to report a fall.   10/31/2023 he fell in a restaurant.    Scraps on back and bruising on left rib cage.    He has an appt later this week for his other conditions.    I'm supposed to call when a pt reports a  fall.    He is doing fine.     2. DOMESTIC VIOLENCE AND ELDER ABUSE SCREENING: "Did you fall because someone pushed you or tried to hurt you?" If Yes, ask: "Are you safe now?"     N/A 3. ONSET: "When did the fall happen?" (e.g., minutes, hours, or days ago)     10/31/2023 in a restaurant. 4. LOCATION: "What part of the body hit the ground?" (e.g., back, buttocks, head, hips, knees, hands, head, stomach)     Scraps on his back and bruising on his left rib cage. 5. INJURY: "Did you hurt (injure) yourself when you fell?" If Yes, ask: "What did you injure? Tell me more about this?" (e.g., body area; type of injury; pain severity)"     See above 6. PAIN: "Is there any pain?" If Yes, ask: "How bad is the pain?" (e.g., Scale 1-10; or mild,  moderate, severe)   - NONE (0): No pain   - MILD (1-3): Doesn't interfere with normal activities    - MODERATE (4-7): Interferes with normal activities or awakens from sleep    - SEVERE (8-10): Excruciating pain, unable to do any normal activities      Mild soreness 7. SIZE: For cuts, bruises, or swelling, ask: "How large is it?" (e.g., inches or centimeters)      Scraps on his back.   Bruising over left rib cage. 8. PREGNANCY: "Is there any chance you are pregnant?" "When was your last menstrual period?"     N/A 9. OTHER SYMPTOMS: "Do you have any other symptoms?" (e.g., dizziness, fever, weakness; new onset or worsening).      No 10. CAUSE: "What do you think caused the fall (or falling)?" (e.g., tripped, dizzy spell)       Not asked  Protocols used: Falls  and [facility]

## 2023-11-03 NOTE — Telephone Encounter (Signed)
  Chief Complaint: Taylor [name], PT with [facility] calling to report a fall with minor injuries.   Symptoms: [place] at [employer] on 10/31/2023.   Bruising over left rib cage and scraps on his back.   No other complaints. Frequency: 10/31/2023 Pertinent Negatives: Patient denies being compromised from any of the injuries. Disposition: [] ED /[] Urgent Care (no appt availability in office) / [] Appointment(In office/virtual)/ []  [place] [facility]/ [] Home Care/ [] Refused Recommended Disposition /[] [place] [facility]/ [x]  Follow-up with PCP Additional Notes: Report sent to Dr. Sherrie Mustache.

## 2023-11-06 ENCOUNTER — Telehealth: Payer: Self-pay

## 2023-11-06 NOTE — Patient Outreach (Signed)
  Care Management   Outreach Note  11/06/2023 Name: Taylor [name] MRN: [id number] DOB: [birth year]  Missed call from [name]. Returned call and received prompt that member was on another call.    Follow Up Plan:  Will attempt additional outreach later today.   [facility]  [facility], [facility] RN Care Manager Direct Dial: [phone] Website: [email or web address]

## 2023-11-10 ENCOUNTER — Telehealth: Payer: Self-pay | Admitting: Family Medicine

## 2023-11-10 DIAGNOSIS — Z8616 Personal history of COVID-19: Secondary | ICD-10-CM | POA: Diagnosis not present

## 2023-11-10 DIAGNOSIS — E785 Hyperlipidemia, unspecified: Secondary | ICD-10-CM | POA: Diagnosis not present

## 2023-11-10 DIAGNOSIS — Z951 Presence of aortocoronary bypass graft: Secondary | ICD-10-CM | POA: Diagnosis not present

## 2023-11-10 DIAGNOSIS — N184 Chronic kidney disease, stage 4 (severe): Secondary | ICD-10-CM | POA: Diagnosis not present

## 2023-11-10 DIAGNOSIS — H9190 Unspecified hearing loss, unspecified ear: Secondary | ICD-10-CM | POA: Diagnosis not present

## 2023-11-10 DIAGNOSIS — J440 Chronic obstructive pulmonary disease with acute lower respiratory infection: Secondary | ICD-10-CM | POA: Diagnosis not present

## 2023-11-10 DIAGNOSIS — A419 Sepsis, unspecified organism: Secondary | ICD-10-CM | POA: Diagnosis not present

## 2023-11-10 DIAGNOSIS — Z7982 Long term (current) use of aspirin: Secondary | ICD-10-CM | POA: Diagnosis not present

## 2023-11-10 DIAGNOSIS — I2510 Atherosclerotic heart disease of native coronary artery without angina pectoris: Secondary | ICD-10-CM | POA: Diagnosis not present

## 2023-11-10 DIAGNOSIS — M109 Gout, unspecified: Secondary | ICD-10-CM | POA: Diagnosis not present

## 2023-11-10 DIAGNOSIS — I130 Hypertensive heart and chronic kidney disease with heart failure and stage 1 through stage 4 chronic kidney disease, or unspecified chronic kidney disease: Secondary | ICD-10-CM | POA: Diagnosis not present

## 2023-11-10 DIAGNOSIS — I714 Abdominal aortic aneurysm, without rupture, unspecified: Secondary | ICD-10-CM | POA: Diagnosis not present

## 2023-11-10 DIAGNOSIS — I4891 Unspecified atrial fibrillation: Secondary | ICD-10-CM | POA: Diagnosis not present

## 2023-11-10 DIAGNOSIS — Z7901 Long term (current) use of anticoagulants: Secondary | ICD-10-CM | POA: Diagnosis not present

## 2023-11-10 DIAGNOSIS — L409 Psoriasis, unspecified: Secondary | ICD-10-CM | POA: Diagnosis not present

## 2023-11-10 DIAGNOSIS — G8929 Other chronic pain: Secondary | ICD-10-CM | POA: Diagnosis not present

## 2023-11-10 DIAGNOSIS — Z87891 Personal history of nicotine dependence: Secondary | ICD-10-CM | POA: Diagnosis not present

## 2023-11-10 DIAGNOSIS — K219 Gastro-esophageal reflux disease without esophagitis: Secondary | ICD-10-CM | POA: Diagnosis not present

## 2023-11-10 DIAGNOSIS — R6520 Severe sepsis without septic shock: Secondary | ICD-10-CM | POA: Diagnosis not present

## 2023-11-10 DIAGNOSIS — I5020 Unspecified systolic (congestive) heart failure: Secondary | ICD-10-CM | POA: Diagnosis not present

## 2023-11-10 DIAGNOSIS — E1122 Type 2 diabetes mellitus with diabetic chronic kidney disease: Secondary | ICD-10-CM | POA: Diagnosis not present

## 2023-11-10 NOTE — Telephone Encounter (Signed)
Onalee Hua w/ [facility] calling to report the pt had a fall yesterday at [place].  Did not get hurt, just scrape on right knuckle.  Cb [phone]

## 2023-11-11 ENCOUNTER — Other Ambulatory Visit: Payer: Self-pay | Admitting: Family Medicine

## 2023-11-11 DIAGNOSIS — I5022 Chronic systolic (congestive) heart failure: Secondary | ICD-10-CM | POA: Diagnosis not present

## 2023-11-11 DIAGNOSIS — I495 Sick sinus syndrome: Secondary | ICD-10-CM | POA: Diagnosis not present

## 2023-11-13 DIAGNOSIS — L570 Actinic keratosis: Secondary | ICD-10-CM | POA: Diagnosis not present

## 2023-11-13 DIAGNOSIS — L400 Psoriasis vulgaris: Secondary | ICD-10-CM | POA: Diagnosis not present

## 2023-11-13 DIAGNOSIS — Z85828 Personal history of other malignant neoplasm of skin: Secondary | ICD-10-CM | POA: Diagnosis not present

## 2023-11-13 DIAGNOSIS — C44622 Squamous cell carcinoma of skin of right upper limb, including shoulder: Secondary | ICD-10-CM | POA: Diagnosis not present

## 2023-11-13 DIAGNOSIS — D492 Neoplasm of unspecified behavior of bone, soft tissue, and skin: Secondary | ICD-10-CM | POA: Diagnosis not present

## 2023-11-17 DIAGNOSIS — Z7982 Long term (current) use of aspirin: Secondary | ICD-10-CM | POA: Diagnosis not present

## 2023-11-17 DIAGNOSIS — Z7901 Long term (current) use of anticoagulants: Secondary | ICD-10-CM | POA: Diagnosis not present

## 2023-11-17 DIAGNOSIS — I714 Abdominal aortic aneurysm, without rupture, unspecified: Secondary | ICD-10-CM | POA: Diagnosis not present

## 2023-11-17 DIAGNOSIS — Z87891 Personal history of nicotine dependence: Secondary | ICD-10-CM | POA: Diagnosis not present

## 2023-11-17 DIAGNOSIS — H9190 Unspecified hearing loss, unspecified ear: Secondary | ICD-10-CM | POA: Diagnosis not present

## 2023-11-17 DIAGNOSIS — A419 Sepsis, unspecified organism: Secondary | ICD-10-CM | POA: Diagnosis not present

## 2023-11-17 DIAGNOSIS — G8929 Other chronic pain: Secondary | ICD-10-CM | POA: Diagnosis not present

## 2023-11-17 DIAGNOSIS — E785 Hyperlipidemia, unspecified: Secondary | ICD-10-CM | POA: Diagnosis not present

## 2023-11-17 DIAGNOSIS — N184 Chronic kidney disease, stage 4 (severe): Secondary | ICD-10-CM | POA: Diagnosis not present

## 2023-11-17 DIAGNOSIS — R6520 Severe sepsis without septic shock: Secondary | ICD-10-CM | POA: Diagnosis not present

## 2023-11-17 DIAGNOSIS — J440 Chronic obstructive pulmonary disease with acute lower respiratory infection: Secondary | ICD-10-CM | POA: Diagnosis not present

## 2023-11-17 DIAGNOSIS — Z951 Presence of aortocoronary bypass graft: Secondary | ICD-10-CM | POA: Diagnosis not present

## 2023-11-17 DIAGNOSIS — I130 Hypertensive heart and chronic kidney disease with heart failure and stage 1 through stage 4 chronic kidney disease, or unspecified chronic kidney disease: Secondary | ICD-10-CM | POA: Diagnosis not present

## 2023-11-17 DIAGNOSIS — E1122 Type 2 diabetes mellitus with diabetic chronic kidney disease: Secondary | ICD-10-CM | POA: Diagnosis not present

## 2023-11-17 DIAGNOSIS — Z8616 Personal history of COVID-19: Secondary | ICD-10-CM | POA: Diagnosis not present

## 2023-11-17 DIAGNOSIS — I5020 Unspecified systolic (congestive) heart failure: Secondary | ICD-10-CM | POA: Diagnosis not present

## 2023-11-17 DIAGNOSIS — M109 Gout, unspecified: Secondary | ICD-10-CM | POA: Diagnosis not present

## 2023-11-17 DIAGNOSIS — I2510 Atherosclerotic heart disease of native coronary artery without angina pectoris: Secondary | ICD-10-CM | POA: Diagnosis not present

## 2023-11-17 DIAGNOSIS — L409 Psoriasis, unspecified: Secondary | ICD-10-CM | POA: Diagnosis not present

## 2023-11-17 DIAGNOSIS — I4891 Unspecified atrial fibrillation: Secondary | ICD-10-CM | POA: Diagnosis not present

## 2023-11-17 DIAGNOSIS — K219 Gastro-esophageal reflux disease without esophagitis: Secondary | ICD-10-CM | POA: Diagnosis not present

## 2023-11-21 ENCOUNTER — Other Ambulatory Visit: Payer: Self-pay

## 2023-11-21 NOTE — Patient Outreach (Unsigned)
  Care Management   Visit Note  11/21/2023 Name: Taylor [name] MRN: [id number] DOB: 1941-07-29  Subjective: Taylor [name] is a 82 y.o. year old male who is a primary care patient of Taylor [name], Taylor Isaacs, MD. The Care Management team was consulted for assistance.      Engaged with [name] via telephone.  Assessment:  Review of patient past medical history, allergies, medications, health status, including review of consultants reports, laboratory and other test data, was performed as part of  evaluation and provision of care management services.   Outpatient Encounter Medications as of 11/21/2023  Medication Sig Note   potassium chloride SA (KLOR-CON M) 20 MEQ tablet Take 10 mEq by mouth 2 (two) times daily.    torsemide (DEMADEX) 20 MG tablet Take 1 tablet (20 mg total) by mouth daily. (Patient taking differently: Take 20 mg by mouth daily. Taking 2 tablet a day.) 10/22/2023: Reports taking 2 tablets daily   acetaminophen (TYLENOL) 500 MG tablet Take 500 mg by mouth every 6 (six) hours as needed for mild pain.    albuterol (VENTOLIN HFA) 108 (90 Base) MCG/ACT inhaler Inhale 2 puffs into the lungs every 6 (six) hours as needed for wheezing or shortness of breath.    apixaban (ELIQUIS) 2.5 MG TABS tablet Take 1 tablet (2.5 mg total) by mouth 2 (two) times daily. 09/10/2023: Pt states he takes half of a 5 mg tablet twice a day.   aspirin 81 MG EC tablet Take 81 mg by mouth daily.     atorvastatin (LIPITOR) 40 MG tablet TAKE 1 TABLET(40 MG) BY MOUTH DAILY    empagliflozin (JARDIANCE) 10 MG TABS tablet Take 1 tablet by mouth daily.    fluticasone (FLONASE) 50 MCG/ACT nasal spray Place 2 sprays into both nostrils daily.    guaiFENesin (MUCINEX PO) Take by mouth.    ipratropium-albuterol (DUONEB) 0.5-2.5 (3) MG/3ML SOLN Inhale 3 mLs into the lungs every 4 (four) hours as needed (wheezing, shob).    isosorbide mononitrate (IMDUR) 30 MG 24 hr tablet Take 0.5 tablets (15 mg total) by mouth daily.     Magnesium 250 MG TABS Take 125 mg by mouth 2 (two) times daily. 10/22/2023: Reports taking half of a tablet in the morning and half of a tablet at night   metoprolol succinate (TOPROL-XL) 25 MG 24 hr tablet Take 1 tablet (25 mg total) by mouth daily. (Patient not taking: Reported on 10/20/2023) 10/22/2023: Patient not taking. Reports medication was discontinued by provider   pantoprazole (PROTONIX) 40 MG tablet TAKE 1 TABLET BY MOUTH EVERY DAY    triamcinolone ointment (KENALOG) 0.1 % Apply topically 2 (two) times daily. APPLY TOPICALLY TWICE DAILY AS DIRECTED    No facility-administered encounter medications on file as of 11/21/2023.     Interventions:     Plan:

## 2023-11-24 DIAGNOSIS — I130 Hypertensive heart and chronic kidney disease with heart failure and stage 1 through stage 4 chronic kidney disease, or unspecified chronic kidney disease: Secondary | ICD-10-CM | POA: Diagnosis not present

## 2023-11-24 DIAGNOSIS — N184 Chronic kidney disease, stage 4 (severe): Secondary | ICD-10-CM | POA: Diagnosis not present

## 2023-11-24 DIAGNOSIS — Z8616 Personal history of COVID-19: Secondary | ICD-10-CM | POA: Diagnosis not present

## 2023-11-24 DIAGNOSIS — I4891 Unspecified atrial fibrillation: Secondary | ICD-10-CM | POA: Diagnosis not present

## 2023-11-24 DIAGNOSIS — L409 Psoriasis, unspecified: Secondary | ICD-10-CM | POA: Diagnosis not present

## 2023-11-24 DIAGNOSIS — G8929 Other chronic pain: Secondary | ICD-10-CM | POA: Diagnosis not present

## 2023-11-24 DIAGNOSIS — E785 Hyperlipidemia, unspecified: Secondary | ICD-10-CM | POA: Diagnosis not present

## 2023-11-24 DIAGNOSIS — E1122 Type 2 diabetes mellitus with diabetic chronic kidney disease: Secondary | ICD-10-CM | POA: Diagnosis not present

## 2023-11-24 DIAGNOSIS — Z7901 Long term (current) use of anticoagulants: Secondary | ICD-10-CM | POA: Diagnosis not present

## 2023-11-24 DIAGNOSIS — J440 Chronic obstructive pulmonary disease with acute lower respiratory infection: Secondary | ICD-10-CM | POA: Diagnosis not present

## 2023-11-24 DIAGNOSIS — Z87891 Personal history of nicotine dependence: Secondary | ICD-10-CM | POA: Diagnosis not present

## 2023-11-24 DIAGNOSIS — Z951 Presence of aortocoronary bypass graft: Secondary | ICD-10-CM | POA: Diagnosis not present

## 2023-11-24 DIAGNOSIS — I5020 Unspecified systolic (congestive) heart failure: Secondary | ICD-10-CM | POA: Diagnosis not present

## 2023-11-24 DIAGNOSIS — K219 Gastro-esophageal reflux disease without esophagitis: Secondary | ICD-10-CM | POA: Diagnosis not present

## 2023-11-24 DIAGNOSIS — H9190 Unspecified hearing loss, unspecified ear: Secondary | ICD-10-CM | POA: Diagnosis not present

## 2023-11-24 DIAGNOSIS — I2510 Atherosclerotic heart disease of native coronary artery without angina pectoris: Secondary | ICD-10-CM | POA: Diagnosis not present

## 2023-11-24 DIAGNOSIS — R6520 Severe sepsis without septic shock: Secondary | ICD-10-CM | POA: Diagnosis not present

## 2023-11-24 DIAGNOSIS — I714 Abdominal aortic aneurysm, without rupture, unspecified: Secondary | ICD-10-CM | POA: Diagnosis not present

## 2023-11-24 DIAGNOSIS — M109 Gout, unspecified: Secondary | ICD-10-CM | POA: Diagnosis not present

## 2023-11-24 DIAGNOSIS — A419 Sepsis, unspecified organism: Secondary | ICD-10-CM | POA: Diagnosis not present

## 2023-11-24 DIAGNOSIS — Z7982 Long term (current) use of aspirin: Secondary | ICD-10-CM | POA: Diagnosis not present

## 2023-11-25 ENCOUNTER — Ambulatory Visit: Payer: Self-pay | Admitting: *Deleted

## 2023-11-25 NOTE — Telephone Encounter (Signed)
  Chief Complaint: coughing spells, SOB, dizziness, poor balance. Symptoms: see above. Reports Sunday vision interrupted and almost passed out. Denies visual issues today . Reports he is still coughing since last OV 10/27/23. Cough spells yellow sputum difficulty to cough up. Feels like something in throat. Reports having 8 falls this year. Dizziness standing . Frequency: 10/27/23 Pertinent Negatives: Patient denies chest pain no difficulty breathing no fever.  Disposition: [] ED /[] Urgent Care (no appt availability in office) / [] Appointment(In office/virtual)/ []  [place] [facility]/ [] Home Care/ [x] Refused Recommended Disposition /[] [place] [facility]/ []  Follow-up with PCP Additional Notes:   Recommended UC due to continued sx no available appt . Please advise. Patient calling to report [facility] PT finished therapy and patient is still having issues with balance. Please advise.  Patient requesting call back.      Reason for Disposition  Continuous (nonstop) coughing interferes with work, school, or sleeping  Answer Assessment - Initial Assessment Questions 1. RESPIRATORY STATUS: Describe your breathing? (e.g., wheezing, shortness of breath, unable to speak, severe coughing)      Severe coughing spells, shortness of breath at times.  2. ONSET: When did this breathing problem begin?      S/p pneumonia  3. PATTERN Does the difficult breathing come and go, or has it been constant since it started?      Comes and goes  4. SEVERITY: How bad is your breathing? (e.g., mild, moderate, severe)    - MILD: No SOB at rest, mild SOB with walking, speaks normally in sentences, can lie down, no retractions, pulse < 100.    - MODERATE: SOB at rest, SOB with minimal exertion and prefers to sit, cannot lie down flat, speaks in phrases, mild retractions, audible wheezing, pulse 100-120.    - SEVERE: Very SOB at rest, speaks in single words, struggling to breathe, sitting hunched forward,  retractions, pulse > 120      SOB at rest  and will exertion 5. RECURRENT SYMPTOM: Have you had difficulty breathing before? If Yes, ask: When was the last time? and What happened that time?      Na  6. CARDIAC HISTORY: Do you have any history of heart disease? (e.g., heart attack, angina, bypass surgery, angioplasty)      See hx  7. LUNG HISTORY: Do you have any history of lung disease?  (e.g., pulmonary embolus, asthma, emphysema)     Hx pneumonia  8. CAUSE: What do you think is causing the breathing problem?      Recent sickness 9. OTHER SYMPTOMS: Do you have any other symptoms? (e.g., dizziness, runny nose, cough, chest pain, fever)     Coughing spells, SOB , dizziness poor balance reports episode of almost passing out Sunday  10. O2 SATURATION MONITOR:  Do you use an oxygen  saturation monitor (pulse oximeter) at home? If Yes, ask: What is your reading (oxygen  level) today? What is your usual oxygen  saturation reading? (e.g., 95%)       na 11. PREGNANCY: Is there any chance you are pregnant? When was your last menstrual period?       na 12. TRAVEL: Have you traveled out of the country in the last month? (e.g., travel history, exposures)       na  Protocols used: Breathing Difficulty-A-AH

## 2023-11-27 ENCOUNTER — Telehealth: Payer: Self-pay

## 2023-11-27 NOTE — Telephone Encounter (Signed)
 Copied from CRM [id number]. Topic: General - Other >> Nov 27, 2023 12:49 PM Santiya F wrote: Reason for CRM: Home Health Verbal Orders - Caller/Agency: [facility]  Callback Number: [phone]- Secure line  Service Requested: Physical Therapy Frequency: 2 week 3 1 week 6  Any new concerns about the patient? No

## 2023-11-27 NOTE — Telephone Encounter (Signed)
 That's fine

## 2023-11-28 NOTE — Telephone Encounter (Signed)
 Left detailed message on secure VM authorizing the below PT request.

## 2023-11-28 NOTE — Telephone Encounter (Signed)
 Pt advised. Stated "OK" and reported having another fall

## 2023-11-28 NOTE — Telephone Encounter (Signed)
 Needs to go to urgent care

## 2023-12-03 ENCOUNTER — Observation Stay: Payer: Medicare Other

## 2023-12-03 ENCOUNTER — Encounter: Payer: Self-pay | Admitting: Internal Medicine

## 2023-12-03 ENCOUNTER — Telehealth: Payer: Self-pay | Admitting: Family Medicine

## 2023-12-03 ENCOUNTER — Emergency Department: Payer: Medicare Other

## 2023-12-03 ENCOUNTER — Other Ambulatory Visit: Payer: Self-pay

## 2023-12-03 ENCOUNTER — Inpatient Hospital Stay
Admission: EM | Admit: 2023-12-03 | Discharge: 2023-12-07 | DRG: 193 | Disposition: A | Discharge status: Home / Self Care | Payer: Medicare Other | Attending: Internal Medicine | Admitting: Internal Medicine

## 2023-12-03 DIAGNOSIS — I5022 Chronic systolic (congestive) heart failure: Secondary | ICD-10-CM | POA: Diagnosis present

## 2023-12-03 DIAGNOSIS — R531 Weakness: Secondary | ICD-10-CM | POA: Diagnosis not present

## 2023-12-03 DIAGNOSIS — J8489 Other specified interstitial pulmonary diseases: Secondary | ICD-10-CM | POA: Diagnosis not present

## 2023-12-03 DIAGNOSIS — Z8616 Personal history of COVID-19: Secondary | ICD-10-CM | POA: Diagnosis not present

## 2023-12-03 DIAGNOSIS — Z8673 Personal history of transient ischemic attack (TIA), and cerebral infarction without residual deficits: Secondary | ICD-10-CM

## 2023-12-03 DIAGNOSIS — H538 Other visual disturbances: Secondary | ICD-10-CM | POA: Diagnosis not present

## 2023-12-03 DIAGNOSIS — Z950 Presence of cardiac pacemaker: Secondary | ICD-10-CM | POA: Diagnosis not present

## 2023-12-03 DIAGNOSIS — H532 Diplopia: Secondary | ICD-10-CM | POA: Diagnosis present

## 2023-12-03 DIAGNOSIS — Z8249 Family history of ischemic heart disease and other diseases of the circulatory system: Secondary | ICD-10-CM | POA: Diagnosis not present

## 2023-12-03 DIAGNOSIS — E1122 Type 2 diabetes mellitus with diabetic chronic kidney disease: Secondary | ICD-10-CM | POA: Diagnosis not present

## 2023-12-03 DIAGNOSIS — Z79899 Other long term (current) drug therapy: Secondary | ICD-10-CM

## 2023-12-03 DIAGNOSIS — R0602 Shortness of breath: Secondary | ICD-10-CM | POA: Diagnosis not present

## 2023-12-03 DIAGNOSIS — I2510 Atherosclerotic heart disease of native coronary artery without angina pectoris: Secondary | ICD-10-CM | POA: Diagnosis not present

## 2023-12-03 DIAGNOSIS — Z7982 Long term (current) use of aspirin: Secondary | ICD-10-CM

## 2023-12-03 DIAGNOSIS — I10 Essential (primary) hypertension: Secondary | ICD-10-CM | POA: Diagnosis present

## 2023-12-03 DIAGNOSIS — Z7984 Long term (current) use of oral hypoglycemic drugs: Secondary | ICD-10-CM

## 2023-12-03 DIAGNOSIS — Z1152 Encounter for screening for COVID-19: Secondary | ICD-10-CM

## 2023-12-03 DIAGNOSIS — R17 Unspecified jaundice: Secondary | ICD-10-CM | POA: Diagnosis not present

## 2023-12-03 DIAGNOSIS — R0989 Other specified symptoms and signs involving the circulatory and respiratory systems: Secondary | ICD-10-CM | POA: Diagnosis not present

## 2023-12-03 DIAGNOSIS — R2990 Unspecified symptoms and signs involving the nervous system: Secondary | ICD-10-CM | POA: Diagnosis not present

## 2023-12-03 DIAGNOSIS — E7800 Pure hypercholesterolemia, unspecified: Secondary | ICD-10-CM | POA: Diagnosis present

## 2023-12-03 DIAGNOSIS — Z87891 Personal history of nicotine dependence: Secondary | ICD-10-CM

## 2023-12-03 DIAGNOSIS — Z9981 Dependence on supplemental oxygen: Secondary | ICD-10-CM | POA: Diagnosis not present

## 2023-12-03 DIAGNOSIS — Z888 Allergy status to other drugs, medicaments and biological substances status: Secondary | ICD-10-CM

## 2023-12-03 DIAGNOSIS — Z808 Family history of malignant neoplasm of other organs or systems: Secondary | ICD-10-CM

## 2023-12-03 DIAGNOSIS — I4891 Unspecified atrial fibrillation: Secondary | ICD-10-CM | POA: Diagnosis present

## 2023-12-03 DIAGNOSIS — I6782 Cerebral ischemia: Secondary | ICD-10-CM | POA: Diagnosis not present

## 2023-12-03 DIAGNOSIS — G4733 Obstructive sleep apnea (adult) (pediatric): Secondary | ICD-10-CM | POA: Diagnosis not present

## 2023-12-03 DIAGNOSIS — J9621 Acute and chronic respiratory failure with hypoxia: Secondary | ICD-10-CM | POA: Diagnosis not present

## 2023-12-03 DIAGNOSIS — R110 Nausea: Secondary | ICD-10-CM | POA: Diagnosis not present

## 2023-12-03 DIAGNOSIS — N179 Acute kidney failure, unspecified: Secondary | ICD-10-CM | POA: Diagnosis not present

## 2023-12-03 DIAGNOSIS — R0600 Dyspnea, unspecified: Secondary | ICD-10-CM

## 2023-12-03 DIAGNOSIS — I480 Paroxysmal atrial fibrillation: Secondary | ICD-10-CM | POA: Diagnosis present

## 2023-12-03 DIAGNOSIS — N184 Chronic kidney disease, stage 4 (severe): Secondary | ICD-10-CM | POA: Diagnosis present

## 2023-12-03 DIAGNOSIS — Z85820 Personal history of malignant melanoma of skin: Secondary | ICD-10-CM | POA: Diagnosis not present

## 2023-12-03 DIAGNOSIS — H579 Unspecified disorder of eye and adnexa: Secondary | ICD-10-CM | POA: Diagnosis not present

## 2023-12-03 DIAGNOSIS — I252 Old myocardial infarction: Secondary | ICD-10-CM

## 2023-12-03 DIAGNOSIS — E1169 Type 2 diabetes mellitus with other specified complication: Secondary | ICD-10-CM

## 2023-12-03 DIAGNOSIS — Z95810 Presence of automatic (implantable) cardiac defibrillator: Secondary | ICD-10-CM

## 2023-12-03 DIAGNOSIS — I130 Hypertensive heart and chronic kidney disease with heart failure and stage 1 through stage 4 chronic kidney disease, or unspecified chronic kidney disease: Secondary | ICD-10-CM | POA: Diagnosis present

## 2023-12-03 DIAGNOSIS — J984 Other disorders of lung: Secondary | ICD-10-CM | POA: Diagnosis present

## 2023-12-03 DIAGNOSIS — Z8551 Personal history of malignant neoplasm of bladder: Secondary | ICD-10-CM

## 2023-12-03 DIAGNOSIS — Z7901 Long term (current) use of anticoagulants: Secondary | ICD-10-CM

## 2023-12-03 DIAGNOSIS — J189 Pneumonia, unspecified organism: Principal | ICD-10-CM | POA: Diagnosis present

## 2023-12-03 DIAGNOSIS — I6523 Occlusion and stenosis of bilateral carotid arteries: Secondary | ICD-10-CM | POA: Diagnosis not present

## 2023-12-03 DIAGNOSIS — I5020 Unspecified systolic (congestive) heart failure: Secondary | ICD-10-CM | POA: Diagnosis present

## 2023-12-03 DIAGNOSIS — K219 Gastro-esophageal reflux disease without esophagitis: Secondary | ICD-10-CM | POA: Diagnosis present

## 2023-12-03 DIAGNOSIS — R918 Other nonspecific abnormal finding of lung field: Secondary | ICD-10-CM | POA: Diagnosis not present

## 2023-12-03 DIAGNOSIS — Z743 Need for continuous supervision: Secondary | ICD-10-CM | POA: Diagnosis not present

## 2023-12-03 DIAGNOSIS — Z955 Presence of coronary angioplasty implant and graft: Secondary | ICD-10-CM

## 2023-12-03 DIAGNOSIS — Z8261 Family history of arthritis: Secondary | ICD-10-CM

## 2023-12-03 DIAGNOSIS — Z9861 Coronary angioplasty status: Secondary | ICD-10-CM

## 2023-12-03 DIAGNOSIS — M109 Gout, unspecified: Secondary | ICD-10-CM | POA: Diagnosis present

## 2023-12-03 DIAGNOSIS — Z951 Presence of aortocoronary bypass graft: Secondary | ICD-10-CM

## 2023-12-03 LAB — CBC
HCT: 39.8 % (ref 39.0–52.0)
Hemoglobin: 11.9 g/dL — ABNORMAL LOW (ref 13.0–17.0)
MCH: 29.9 pg (ref 26.0–34.0)
MCHC: 29.9 g/dL — ABNORMAL LOW (ref 30.0–36.0)
MCV: 100 fL (ref 80.0–100.0)
Platelets: 170 10*3/uL (ref 150–400)
RBC: 3.98 MIL/uL — ABNORMAL LOW (ref 4.22–5.81)
RDW: 19.9 % — ABNORMAL HIGH (ref 11.5–15.5)
WBC: 6 10*3/uL (ref 4.0–10.5)
nRBC: 0 % (ref 0.0–0.2)

## 2023-12-03 LAB — COMPREHENSIVE METABOLIC PANEL
ALT: 8 U/L (ref 0–44)
AST: 31 U/L (ref 15–41)
Albumin: 2.6 g/dL — ABNORMAL LOW (ref 3.5–5.0)
Alkaline Phosphatase: 150 U/L — ABNORMAL HIGH (ref 38–126)
Anion gap: 12 (ref 5–15)
BUN: 43 mg/dL — ABNORMAL HIGH (ref 8–23)
CO2: 24 mmol/L (ref 22–32)
Calcium: 9 mg/dL (ref 8.9–10.3)
Chloride: 101 mmol/L (ref 98–111)
Creatinine, Ser: 3.05 mg/dL — ABNORMAL HIGH (ref 0.61–1.24)
GFR, Estimated: 20 mL/min — ABNORMAL LOW (ref >60)
Glucose, Bld: 117 mg/dL — ABNORMAL HIGH (ref 70–99)
Potassium: 5.3 mmol/L — ABNORMAL HIGH (ref 3.5–5.1)
Sodium: 137 mmol/L (ref 135–145)
Total Bilirubin: 1.9 mg/dL — ABNORMAL HIGH (ref 0.0–1.2)
Total Protein: 7.8 g/dL (ref 6.5–8.1)

## 2023-12-03 LAB — URINALYSIS, ROUTINE W REFLEX MICROSCOPIC
Bilirubin Urine: NEGATIVE
Glucose, UA: 150 mg/dL — AB
Hgb urine dipstick: NEGATIVE
Ketones, ur: NEGATIVE mg/dL
Leukocytes,Ua: NEGATIVE
Nitrite: NEGATIVE
Protein, ur: NEGATIVE mg/dL
Specific Gravity, Urine: 1.009 (ref 1.005–1.030)
pH: 5 (ref 5.0–8.0)

## 2023-12-03 LAB — URINE DRUG SCREEN, QUALITATIVE (ARMC ONLY)
Amphetamines, Ur Screen: NOT DETECTED
Barbiturates, Ur Screen: NOT DETECTED
Benzodiazepine, Ur Scrn: NOT DETECTED
Cannabinoid 50 Ng, Ur ~~LOC~~: NOT DETECTED
Cocaine Metabolite,Ur ~~LOC~~: NOT DETECTED
MDMA (Ecstasy)Ur Screen: NOT DETECTED
Methadone Scn, Ur: NOT DETECTED
Opiate, Ur Screen: NOT DETECTED
Phencyclidine (PCP) Ur S: NOT DETECTED
Tricyclic, Ur Screen: NOT DETECTED

## 2023-12-03 LAB — PROCALCITONIN: Procalcitonin: 0.19 ng/mL

## 2023-12-03 LAB — TROPONIN I (HIGH SENSITIVITY)
Troponin I (High Sensitivity): 24 ng/L — ABNORMAL HIGH (ref <18)
Troponin I (High Sensitivity): 26 ng/L — ABNORMAL HIGH (ref <18)

## 2023-12-03 LAB — BRAIN NATRIURETIC PEPTIDE: B Natriuretic Peptide: 1056.2 pg/mL — ABNORMAL HIGH (ref 0.0–100.0)

## 2023-12-03 LAB — RESP PANEL BY RT-PCR (RSV, FLU A&B, COVID)  RVPGX2
Influenza A by PCR: NEGATIVE
Influenza B by PCR: NEGATIVE
Resp Syncytial Virus by PCR: NEGATIVE
SARS Coronavirus 2 by RT PCR: NEGATIVE

## 2023-12-03 LAB — CBG MONITORING, ED: Glucose-Capillary: 102 mg/dL — ABNORMAL HIGH (ref 70–99)

## 2023-12-03 MED ORDER — FLUTICASONE PROPIONATE 50 MCG/ACT NA SUSP: 2.0000 | Freq: Every day | NASAL | Status: DC | PRN

## 2023-12-03 MED ORDER — APIXABAN 2.5 MG PO TABS
2.5000 mg | ORAL_TABLET | Freq: Two times a day (BID) | ORAL | Status: DC
  Administered 2023-12-03 – 2023-12-07 (×8): 2.5 mg via ORAL
  Filled 2023-12-03 (×9): qty 1

## 2023-12-03 MED ORDER — GUAIFENESIN-DM 100-10 MG/5ML PO SYRP
10.0000 mL | ORAL_SOLUTION | Freq: Once | ORAL | Status: AC
  Administered 2023-12-03: 10 mL via ORAL
  Filled 2023-12-03: qty 10

## 2023-12-03 MED ORDER — FUROSEMIDE 10 MG/ML IJ SOLN
40.0000 mg | Freq: Once | INTRAMUSCULAR | Status: AC
  Administered 2023-12-03: 40 mg via INTRAVENOUS
  Filled 2023-12-03: qty 4

## 2023-12-03 MED ORDER — SENNOSIDES-DOCUSATE SODIUM 8.6-50 MG PO TABS: 1.0000 | ORAL_TABLET | Freq: Every evening | ORAL | Status: DC | PRN

## 2023-12-03 MED ORDER — ACETAMINOPHEN 650 MG RE SUPP: 650.0000 mg | RECTAL | Status: DC | PRN

## 2023-12-03 MED ORDER — ACETAMINOPHEN 160 MG/5ML PO SOLN
650.0000 mg | ORAL | Status: DC | PRN
  Filled 2023-12-03: qty 20.3

## 2023-12-03 MED ORDER — PANTOPRAZOLE SODIUM 40 MG PO TBEC
40.0000 mg | DELAYED_RELEASE_TABLET | Freq: Every day | ORAL | Status: DC
  Administered 2023-12-04 – 2023-12-07 (×4): 40 mg via ORAL
  Filled 2023-12-03 (×4): qty 1

## 2023-12-03 MED ORDER — ACETAMINOPHEN 325 MG PO TABS
650.0000 mg | ORAL_TABLET | ORAL | Status: DC | PRN
  Administered 2023-12-05: 650 mg via ORAL
  Filled 2023-12-03: qty 2

## 2023-12-03 MED ORDER — SODIUM CHLORIDE 0.9 % IV BOLUS
500.0000 mL | Freq: Once | INTRAVENOUS | Status: AC
  Administered 2023-12-03: 500 mL via INTRAVENOUS

## 2023-12-03 MED ORDER — HYDRALAZINE HCL 20 MG/ML IJ SOLN
5.0000 mg | Freq: Four times a day (QID) | INTRAMUSCULAR | Status: DC | PRN
Start: 2023-12-03 — End: 2023-12-07

## 2023-12-03 MED ORDER — IPRATROPIUM-ALBUTEROL 0.5-2.5 (3) MG/3ML IN SOLN
3.0000 mL | RESPIRATORY_TRACT | Status: DC | PRN
  Administered 2023-12-03 – 2023-12-05 (×2): 3 mL via RESPIRATORY_TRACT
  Filled 2023-12-03 (×2): qty 3

## 2023-12-03 MED ORDER — STROKE: EARLY STAGES OF RECOVERY BOOK: Freq: Once | Status: AC

## 2023-12-03 MED ORDER — MAGNESIUM OXIDE -MG SUPPLEMENT 400 (240 MG) MG PO TABS
200.0000 mg | ORAL_TABLET | Freq: Two times a day (BID) | ORAL | Status: DC
  Administered 2023-12-03 – 2023-12-07 (×8): 200 mg via ORAL
  Filled 2023-12-03 (×8): qty 1

## 2023-12-03 MED ORDER — ATORVASTATIN CALCIUM 20 MG PO TABS
40.0000 mg | ORAL_TABLET | Freq: Every day | ORAL | Status: DC
  Administered 2023-12-04 – 2023-12-07 (×4): 40 mg via ORAL
  Filled 2023-12-03 (×4): qty 2

## 2023-12-03 MED ORDER — MAGNESIUM 250 MG PO TABS: 125.0000 mg | ORAL_TABLET | Freq: Two times a day (BID) | ORAL | Status: DC

## 2023-12-03 NOTE — Assessment & Plan Note (Signed)
 Resumed home Eliquis 2.5 mg p.o. twice daily, atorvastatin 40 mg daily

## 2023-12-03 NOTE — Hospital Course (Signed)
 Mr. Taran Haynesworth is an 83 year old male with history of left parietal skull fracture, history of left occipital infarct, CKD stage IV, non-insulin -dependent diabetes mellitus, hyperlipidemia, hypertension, GERD, who presents to the emergency department for chief concerns of weakness, dizziness, double vision.  Vitals in the ED showed temperature of 97.6, respiration rate 30, heart rate of 85, blood pressure 130/79, SpO2 of 98% on room air.  Serum sodium is 137, potassium 5.3, chloride 101, bicarb 24, BUN of 40, serum creatinine 3.05, EGFR of 20, nonfasting blood glucose 117, WBC 6.0, hemoglobin 11.9, platelets of 170.  BNP is elevated 1056.2.  T. bili elevated at 1.9.  High sensitive troponin was 24 on repeat was 26.  Alk phos was 150.  Albumin  level was 2.6.  COVID/influenza A/influenza B/RSV PCR were negative.  UA was negative for leukocytes and nitrates.  Chest x-ray: Was read as right bibasilar opacity, cardiac device, dual leads in place.    ED treatment: Furosemide  40 mg IV one-time dose, sodium chloride  500 mL liter bolus.

## 2023-12-03 NOTE — Telephone Encounter (Signed)
 Home health certification paperwork received via fax today and front desk paperwork started and placed in provider mailbox VM

## 2023-12-03 NOTE — H&P (Signed)
 History and Physical   Taylor [name] FMW:[id number] DOB: 11/09/1941 DOA: 12/03/2023  PCP: Taylor Nancyann FORBES, MD  Outpatient Specialists: Dr. Nanci Blanch, [facility] cardiology Patient coming from: home via EMS   I have personally briefly reviewed patient's old medical records in [facility] EMR.  Chief Concern: double vision  HPI: Taylor [name] is an 83 year old male with history of left parietal skull fracture, history of left occipital infarct, CKD stage IV, non-insulin -dependent diabetes mellitus, hyperlipidemia, hypertension, GERD, who presents to the emergency department for chief concerns of weakness, dizziness, double vision.  Vitals in the ED showed temperature of 97.6, respiration rate 30, heart rate of 85, blood pressure 130/79, SpO2 of 98% on room air.  Serum sodium is 137, potassium 5.3, chloride 101, bicarb 24, BUN of 40, serum creatinine 3.05, EGFR of 20, nonfasting blood glucose 117, WBC 6.0, hemoglobin 11.9, platelets of 170.  BNP is elevated 1056.2.  T. bili elevated at 1.9.  High sensitive troponin was 24 on repeat was 26.  Alk phos was 150.  Albumin  level was 2.6.  COVID/influenza A/influenza B/RSV PCR were negative.  UA was negative for leukocytes and nitrates.  Chest x-ray: Was read as right bibasilar opacity, cardiac device, dual leads in place.    ED treatment: Furosemide  40 mg IV one-time dose, sodium chloride  500 mL liter bolus. -------------------------------- At bedside patient name, age, location, current year.  He was able to identify his spouse bedside  He reports that he was sitting at home watching TV when he developed double vision and diaphoresis.  It lasted approximately 10 minutes and has resolved.  He reports that his tongue hurts, and it feels like it is split. He is not aware of passing out. Spouse is not aware of any passing out, syncope, lost of consciousness, or seizure.  He denies chest pain, nausea, vomiting, shortness of breath, abdominal  pain, dysuria, hematuria, diarrhea.  He reports normal bowel movements prior to ED presentation.  He does not know what color it was because he did not look.  Social history: He lives at home with his spouse. He denies EtOH, tobacco use, recreational drug use.  He is retired.  ROS: Constitutional: no weight change, no fever ENT/Mouth: no sore throat, no rhinorrhea Eyes: no eye pain, + double vision Cardiovascular: no chest pain, no dyspnea,  no edema, no palpitations Respiratory: no cough, no sputum, no wheezing Gastrointestinal: no nausea, no vomiting, no diarrhea, no constipation Genitourinary: no urinary incontinence, no dysuria, no hematuria Musculoskeletal: no arthralgias, no myalgias Skin: no skin lesions, no pruritus, Neuro: + weakness, no loss of consciousness, no syncope Psych: no anxiety, no depression, + decrease appetite Heme/Lymph: no bruising, no bleeding  ED Course: Discussed with EDP, patient requiring hospitalization for chief concerns of strokelike symptoms.  Assessment/Plan  Principal Problem:   Stroke-like symptom Active Problems:   CAD S/P percutaneous coronary angioplasty   History of CVA (cerebrovascular accident)   Systolic CHF with reduced left ventricular function, NYHA class 3 (HCC)   Obstructive sleep apnea   Atrial fibrillation (HCC)   Hyperbilirubinemia   Essential (primary) hypertension   Cardiac defibrillator in place   Restrictive lung disease   Other specified interstitial pulmonary diseases (HCC)   Implantable cardioverter-defibrillator (ICD) in situ   Acute kidney injury superimposed on stage 4 chronic kidney disease (HCC)   Assessment and Plan:  * Stroke-like symptom Neurology has been consulted and we appreciate further recommendations MRI brain w/o contrast, bilateral carotid US  ordered per  neurology recommendations Fasting lipid and A1c ordered Frequent neuro vascular checks N.p.o. pending swallow study PT, OT, SLP Fall  precaution  History of CVA (cerebrovascular accident) Resumed home Eliquis  2.5 mg p.o. twice daily, atorvastatin  40 mg daily  CAD S/P percutaneous coronary angioplasty Status post CABG in 08/2006  Atrial fibrillation (HCC) Eliquis  2.5 mg p.o. twice daily  Essential (primary) hypertension Home torsemide  20 mg p.o. twice daily not resumed on admission due to AKI Hydralazine  5 mg IV every 6 hours.  For SBP 170, 5 days ordered  Pure hypercholesterolemia, unspecified Atorvastatin  40 mg daily resumed  Cardiac defibrillator in place Status post ventricular fibrillation arrest in 1994, status post MI  Chart reviewed.   DVT prophylaxis: Eliquis  2.5 mg p.o. twice daily Code Status: Full code Diet: heart healthy/carb modified Family Communication: Updated spouse bedside with patient permission Disposition Plan: Pending Consults called: Neurology Admission status: Telemetry medical, observation  Past Medical History:  Diagnosis Date   AAA (abdominal aortic aneurysm) (HCC) 06/03/2007   [facility]; Dr. Gerlean   AICD (automatic cardioverter/defibrillator) present    Arrhythmia    atrial fibrillation   Barrett's esophagus    Bladder cancer (HCC)    Bradycardia    CAD (coronary artery disease)    CAP (community acquired pneumonia) 11/13/2019   CHF (congestive heart failure) (HCC)    Cluster headache    COVID-19 09/27/2021   DDD (degenerative disc disease), lumbar    Diabetes mellitus without complication (HCC)    Dyspnea    WITH EXERTION   Edema    LEFT ANKLE   Fracture of skull base (HCC) 1997   due to fall   GERD (gastroesophageal reflux disease)    Gout    History of bladder cancer 12/1995   Hyperlipidemia    Hypertension    Hypocalcemia 04/19/2020   Possibly secondary to diuretics.   Low=5.9 04/19/2020   Malignant melanoma (HCC) 12/2012   right dorsal forearm excised   Myocardial infarction [facility])    LAST 2014   Osteoarthritis of knee    Other  specified complication of vascular prosthetic devices, implants and grafts, initial encounter (HCC) 08/22/2021   Pacemaker 10/10/2006   Pancreatitis 11/22/2019   Pneumonia    2016   Pre-diabetes    Psoriasis    Rib fracture 1997   due to fall   Sleep apnea    CPAP   Stroke [facility])    Venous incompetence    Past Surgical History:  Procedure Laterality Date   ABDOMINAL AORTIC ANEURYSM REPAIR  06/03/2007   [facility]; Dr. Gerlean   ANGIOPLASTY  1994   MI   BLADDER TUMOR EXCISION  12/1995   CAROTID PTA/STENT INTERVENTION Left 07/23/2022   Procedure: CAROTID PTA/STENT INTERVENTION;  Surgeon: Jama Cordella MATSU, MD;  Location: [facility] INVASIVE CV LAB;  Service: Cardiovascular;  Laterality: Left;   CATARACT EXTRACTION W/PHACO Left 10/22/2016   Procedure: CATARACT EXTRACTION PHACO AND INTRAOCULAR LENS PLACEMENT (IOC);  Surgeon: Elsie Carmine, MD;  Location: [facility] ORS;  Service: Ophthalmology;  Laterality: Left;  US  47.7 AP% 18.4 CDE 8.78 Fluid pack lot # [id number]   CATARACT EXTRACTION W/PHACO Right 12/10/2016   Procedure: CATARACT EXTRACTION PHACO AND INTRAOCULAR LENS PLACEMENT (IOC);  Surgeon: Elsie Carmine, MD;  Location: [facility] ORS;  Service: Ophthalmology;  Laterality: Right;  US  00:39 AP% 23.3 CDE 9.13 Fluid pack lot # [id number]   CORONARY ANGIOPLASTY     STENTS X 5   CORONARY ARTERY BYPASS GRAFT  09/22/2006   four   ELBOW BURSA SURGERY     DUE TO GOUT   INSERT / REPLACE / REMOVE PACEMAKER     MELANOMA EXCISION  12/2012   Right forearm   PPM GENERATOR CHANGEOUT N/A 02/13/2023   Procedure: PPM GENERATOR CHANGEOUT;  Surgeon: Ammon Blunt, MD;  Location: [facility] INVASIVE CV LAB;  Service: Cardiovascular;  Laterality: N/A;   Social History:  reports that he quit smoking about 24 years ago. His smoking use included cigarettes. He started smoking about 54 years ago. He has a 30 pack-year smoking history. He has never used smokeless tobacco. He reports that he does  not drink alcohol and does not use drugs.  Allergies  Allergen Reactions   Amlodipine Besylate Swelling    Had a reaction when taking with colcrys     Crestor [Rosuvastatin]     Muscle cramps and pain. Tolerates atorvastatin    Family History  Problem Relation Age of Onset   Cancer Mother        Melanoma skin cancer   Heart attack Father 69   Cancer Father        throat cancer   Arthritis Brother    Family history: Family history reviewed and not pertinent.  Prior to Admission medications   Medication Sig Start Date End Date Taking? Authorizing Provider  acetaminophen  (TYLENOL ) 500 MG tablet Take 500 mg by mouth every 6 (six) hours as needed for mild pain.   Yes [provider]  albuterol  (VENTOLIN  HFA) 108 (90 Base) MCG/ACT inhaler Inhale 2 puffs into the lungs every 6 (six) hours as needed for wheezing or shortness of breath. 01/03/23  Yes Taylor Nancyann BRAVO, MD  apixaban  (ELIQUIS ) 2.5 MG TABS tablet Take 1 tablet (2.5 mg total) by mouth 2 (two) times daily. 04/29/23  Yes Caleen Qualia, MD  atorvastatin  (LIPITOR) 40 MG tablet TAKE 1 TABLET(40 MG) BY MOUTH DAILY 05/13/23  Yes Taylor Nancyann BRAVO, MD  clobetasol  ointment (TEMOVATE ) 0.05 % Apply 1 Application topically 2 (two) times daily.   Yes [provider]  empagliflozin  (JARDIANCE ) 10 MG TABS tablet Take 1 tablet by mouth daily. 10/14/23 10/13/24 Yes [provider]  fluticasone  (FLONASE ) 50 MCG/ACT nasal spray Place 2 sprays into both nostrils daily. 10/27/23  Yes Taylor Nancyann BRAVO, MD  guaiFENesin  (MUCINEX  PO) Take by mouth.   Yes [provider]  ipratropium-albuterol  (DUONEB) 0.5-2.5 (3) MG/3ML SOLN Inhale 3 mLs into the lungs every 4 (four) hours as needed (wheezing, shob). 04/29/23  Yes Caleen Qualia, MD  Magnesium  250 MG TABS Take 125 mg by mouth 2 (two) times daily.   Yes [provider]  pantoprazole  (PROTONIX ) 40 MG tablet TAKE 1 TABLET BY MOUTH EVERY DAY 11/11/23  Yes Taylor Nancyann BRAVO, MD   potassium chloride  SA (KLOR-CON  M) 20 MEQ tablet Take 10 mEq by mouth 2 (two) times daily.   Yes [provider]  torsemide  (DEMADEX ) 20 MG tablet Take 1 tablet (20 mg total) by mouth daily. Patient taking differently: Take 20 mg by mouth 2 (two) times daily. 09/19/23  Yes [place] A, FNP  triamcinolone  ointment (KENALOG ) 0.1 % Apply topically 2 (two) times daily. APPLY TOPICALLY TWICE DAILY AS DIRECTED 10/20/23  Yes Taylor Nancyann BRAVO, MD  aspirin  81 MG EC tablet Take 81 mg by mouth daily.  Patient not taking: Reported on 12/03/2023    [provider]  isosorbide  mononitrate (IMDUR ) 30 MG 24 hr tablet Take 0.5 tablets (15 mg total) by mouth daily.  Patient not taking: Reported on 12/03/2023 09/18/23   Dorinda Drue DASEN, MD  metoprolol  succinate (TOPROL -XL) 25 MG 24 hr tablet Take 1 tablet (25 mg total) by mouth daily. Patient not taking: Reported on 10/20/2023 09/18/23   Dorinda Drue DASEN, MD   Physical Exam: Vitals:   12/03/23 1130 12/03/23 1200 12/03/23 1330 12/03/23 1415  BP: (!) 129/93 (!) 122/106 (!) 114/91   Pulse:  78 76 78  Resp: (!) 24 (!) 22 (!) 24 (!) 21  Temp:      TempSrc:      SpO2:  95% (!) 86% 92%  Weight:      Height:       Constitutional: appears appropriate, NAD, calm Eyes: PERRL, lids and conjunctivae normal ENMT: Mucous membranes are moist. Posterior pharynx clear of any exudate or lesions. Age-appropriate dentition. Mild-moderate hearing loss. Tip of tongue has small cut/tear measuring approximately 1/2 cm in length with minimal bleeding Neck: normal, supple, no masses, no thyromegaly Respiratory: clear to auscultation bilaterally, no wheezing, no crackles. Normal respiratory effort. No accessory muscle use.  Cardiovascular: Regular rate and rhythm, no murmurs / rubs / gallops. Bilateral trace lower extremity edema. 2+ pedal pulses. No carotid bruits.  Abdomen: no tenderness, no masses palpated, no hepatosplenomegaly. Bowel sounds positive.   Musculoskeletal: no clubbing / cyanosis. No joint deformity upper and lower extremities. Good ROM, no contractures, no atrophy. Normal muscle tone.  Skin: no rashes, lesions, ulcers. No induration Neurologic: Sensation intact. Strength 5/5 in all 4.  Psychiatric: Normal judgment and insight. Alert and oriented x 3. Normal mood.   EKG: independently reviewed, showing atrial fibrillation with rate of 79, QTc 513  Chest x-ray on Admission: I personally reviewed and I agree with radiologist reading as below.  DG Chest 2 View Result Date: 12/03/2023 CLINICAL DATA:  Shortness of breath. EXAM: CHEST - 2 VIEW COMPARISON:  09/10/2023. FINDINGS: There is mild-to-moderate diffuse pulmonary vascular congestion. There is right basilar opacity likely combination of right lung atelectasis and/or consolidation with small right pleural effusion. No significant left pleural effusion. No pneumothorax on either side. Stable moderately enlarged cardio-mediastinal silhouette. There is a left sided 3-lead pacemaker. There are surgical staples along the heart border and sternotomy wires, status post CABG (coronary artery bypass graft). No acute osseous abnormalities. The soft tissues are within normal limits. IMPRESSION: *Findings favor congestive heart failure/pulmonary edema. *Right basilar opacity, as described above. Electronically Signed   By: Ree Molt M.D.   On: 12/03/2023 08:45   CT HEAD WO CONTRAST ( ) Result Date: 12/03/2023 CLINICAL DATA:  Weakness.  Double vision.  Nausea.  Diaphoresis. EXAM: CT HEAD WITHOUT CONTRAST TECHNIQUE: Contiguous axial images were obtained from the base of the skull through the vertex without intravenous contrast. RADIATION DOSE REDUCTION: This exam was performed according to the departmental dose-optimization program which includes automated exposure control, adjustment of the mA and/or kV according to patient size and/or use of iterative reconstruction technique. COMPARISON:   09/10/2023 FINDINGS: Brain: No abnormality is seen affecting the brainstem or cerebellum. Cerebral hemispheres show age related volume loss with chronic small-vessel ischemic changes of the white matter and an old left occipital infarction. No sign of acute infarction, mass lesion, hemorrhage, hydrocephalus or extra-axial collection. Vascular: There is atherosclerotic calcification of the major vessels at the base of the brain. Skull: Old healed left parietal skull fracture. Sinuses/Orbits: Mild mucosal thickening of the paranasal sinuses, improved since the study of October. Orbits negative. Other: None IMPRESSION: 1. No acute CT finding.  Age related volume loss with chronic small-vessel ischemic changes of the white matter and an old left occipital infarction. 2. Old healed left parietal skull fracture. 3. Mild mucosal thickening of the paranasal sinuses, improved since the study of October. Electronically Signed   By: Oneil Officer M.D.   On: 12/03/2023 08:35   Labs on Admission: I have personally reviewed following labs  CBC: Recent Labs  Lab 12/03/23 0802  WBC 6.0  HGB 11.9*  HCT 39.8  MCV 100.0  PLT 170   Basic Metabolic Panel: Recent Labs  Lab 12/03/23 0802  NA 137  K 5.3*  CL 101  CO2 24  GLUCOSE 117*  BUN 43*  CREATININE 3.05*  CALCIUM  9.0   GFR: Estimated Creatinine Clearance: 18.9 mL/min (A) (by C-G formula based on SCr of 3.05 mg/dL (H)).  Liver Function Tests: Recent Labs  Lab 12/03/23 0802  AST 31  ALT 8  ALKPHOS 150*  BILITOT 1.9*  PROT 7.8  ALBUMIN  2.6*   CBG: Recent Labs  Lab 12/03/23 0807  GLUCAP 102*   Urine analysis:    Component Value Date/Time   COLORURINE YELLOW (A) 12/03/2023 1200   APPEARANCEUR CLEAR (A) 12/03/2023 1200   LABSPEC 1.009 12/03/2023 1200   PHURINE 5.0 12/03/2023 1200   GLUCOSEU 150 (A) 12/03/2023 1200   HGBUR NEGATIVE 12/03/2023 1200   BILIRUBINUR NEGATIVE 12/03/2023 1200   BILIRUBINUR negative 03/31/2018 1452   KETONESUR  NEGATIVE 12/03/2023 1200   PROTEINUR NEGATIVE 12/03/2023 1200   UROBILINOGEN 0.2 03/31/2018 1452   NITRITE NEGATIVE 12/03/2023 1200   LEUKOCYTESUR NEGATIVE 12/03/2023 1200   This document was prepared using Dragon Voice Recognition software and may include unintentional dictation errors.  Dr. Sherre [facility]  If 7PM-7AM, please contact overnight-coverage provider If 7AM-7PM, please contact day attending provider [email or web address]  12/03/2023, 3:44 PM

## 2023-12-03 NOTE — ED Triage Notes (Signed)
 Pt arrived via [facility] from home with reports of weakness, dizziness, nausea and double vision.  Pt states sxs began at 0700 this morning. Pt states he was fine eating breakfast and walking around. Pt states he was walking around and sat down to watch TV and pt states he started having double vision, nausea, pt states he became diaphoretic, and dizzy.  Pt states when EMS arrived he was having difficulty getting up.  Per EMS. Pt was given 4mg  Zofran  IV, pt's wife reported recent cough and congestion.  Pt frequently grunting in triage, unable to speak in long sentences without running out of breath.  Pt has pacemaker and defibrillator.

## 2023-12-03 NOTE — ED Provider Notes (Signed)
 [facility] Provider Note    Event Date/Time   First MD Initiated Contact with Patient 12/03/23 [phone]     (approximate)   History   Weakness and Shortness of Breath   HPI  Taylor [name] is a 83 y.o. male past medical history significant for AAA, Medtronic pacemaker defibrillator, Barrett's esophagus, CAD, CHF, hypertension, hyperlipidemia, who presents to the emergency department with concern for weakness, shortness of breath and double vision.  States that today he got up and was in his normal state of health.  After being awake he had an episode where he had double vision and the images were 1 on top of each other.  States that he felt very dizzy and weak at this time.  Lasted for approximately 10 minutes and resolved.  Complaining of a mild headache.  No double vision at this time.  Denies any extremity numbness or weakness.  Complaining of shortness of breath.  Denies any significant chest pain but does endorse a cough.  Denies dysuria, urinary urgency or frequency.     Physical Exam   Triage Vital Signs: ED Triage Vitals  Encounter Vitals Group     BP 12/03/23 0746 130/79     Systolic BP Percentile --      Diastolic BP Percentile --      Pulse Rate 12/03/23 0746 85     Resp 12/03/23 0746 (!) 30     Temp 12/03/23 0746 97.6 F (36.4 C)     Temp Source 12/03/23 0746 Oral     SpO2 12/03/23 0744 95 %     Weight 12/03/23 0753 155 lb (70.3 kg)     Height 12/03/23 0753 5' 10 (1.778 m)     Head Circumference --      Peak Flow --      Pain Score 12/03/23 0753 0     Pain Loc --      Pain Education --      Exclude from Growth Chart --     Most recent vital signs: Vitals:   12/03/23 1415 12/03/23 1544  BP:    Pulse: 78   Resp: (!) 21   Temp:  98 F (36.7 C)  SpO2: 92%     Physical Exam Constitutional:      Appearance: He is well-developed.  HENT:     Head: Atraumatic.  Eyes:     Extraocular Movements: Extraocular movements intact.      Conjunctiva/sclera: Conjunctivae normal.     Comments: Mild disconjugate gaze  Cardiovascular:     Rate and Rhythm: Regular rhythm.  Pulmonary:     Effort: Pulmonary effort is normal. No respiratory distress.  Musculoskeletal:        General: Normal range of motion.     Cervical back: Normal range of motion.     Right lower leg: No edema.     Left lower leg: No edema.  Skin:    General: Skin is warm.     Capillary Refill: Capillary refill takes less than 2 seconds.  Neurological:     Mental Status: He is alert. Mental status is at baseline.     GCS: GCS eye subscore is 4. GCS verbal subscore is 5. GCS motor subscore is 6.     Cranial Nerves: Cranial nerves 2-12 are intact.     Sensory: Sensation is intact.     Motor: Motor function is intact.     Coordination: Coordination is intact.     IMPRESSION /  MDM / ASSESSMENT AND PLAN / ED COURSE  I reviewed the triage vital signs and the nursing notes.  Differential diagnosis including intracranial hemorrhage, CVA, ACS, pneumonia, dysrhythmia, heart failure exacerbation  EKG  I, Clotilda Punter, the attending physician, personally viewed and interpreted this ECG.  Atrial fibrillation with paced rhythm.  Negative Sgarbossa's criteria.  No significant ST elevation or depression.  No tachycardic or bradycardic dysrhythmias while on cardiac telemetry.  RADIOLOGY I independently reviewed imaging, my interpretation of imaging: CT scan of the head with no signs of intracranial hemorrhage  CT scan of the head was read as no acute findings.  Old left parietal skull fracture.  Chest x-ray concerning for heart failure with pulmonary edema.  LABS (all labs ordered are listed, but only abnormal results are displayed) Labs interpreted as -    Labs Reviewed  CBC - Abnormal; Notable for the following components:      Result Value   RBC 3.98 (*)    Hemoglobin 11.9 (*)    MCHC 29.9 (*)    RDW 19.9 (*)    All other components within normal  limits  URINALYSIS, ROUTINE W REFLEX MICROSCOPIC - Abnormal; Notable for the following components:   Color, Urine YELLOW (*)    APPearance CLEAR (*)    Glucose, UA 150 (*)    All other components within normal limits  BRAIN NATRIURETIC PEPTIDE - Abnormal; Notable for the following components:   B Natriuretic Peptide 1,056.2 (*)    All other components within normal limits  COMPREHENSIVE METABOLIC PANEL - Abnormal; Notable for the following components:   Potassium 5.3 (*)    Glucose, Bld 117 (*)    BUN 43 (*)    Creatinine, Ser 3.05 (*)    Albumin  2.6 (*)    Alkaline Phosphatase 150 (*)    Total Bilirubin 1.9 (*)    GFR, Estimated 20 (*)    All other components within normal limits  CBG MONITORING, ED - Abnormal; Notable for the following components:   Glucose-Capillary 102 (*)    All other components within normal limits  TROPONIN I (HIGH SENSITIVITY) - Abnormal; Notable for the following components:   Troponin I (High Sensitivity) 24 (*)    All other components within normal limits  TROPONIN I (HIGH SENSITIVITY) - Abnormal; Notable for the following components:   Troponin I (High Sensitivity) 26 (*)    All other components within normal limits  RESP PANEL BY RT-PCR (RSV, FLU A&B, COVID)  RVPGX2  URINE DRUG SCREEN, QUALITATIVE ([facility] ONLY)  PROCALCITONIN     MDM  Medtronic pacemaker/defibrillator was interrogated at bedside, multiple episodes of atrial tachycardia over the past 26 days.  Also noted signs of volume overload.  Patient is back to to his baseline and not having any vertigo symptoms or double vision at this time.  Possible TIA/CVA.  Outside of the window for TNK and had significant improvement of his symptoms.  Consider doing a CTA however has a low GFR.  Does have mildly elevated troponin.  Elevated BNP.  Creatinine elevated at 3 with mildly elevated potassium.  Given IV Lasix .  Consulted hospitalist for admission for acute on chronic heart failure exacerbation,  CVA/TIA     PROCEDURES:  Critical Care performed: No  Procedures  Patient's presentation is most consistent with acute presentation with potential threat to life or bodily function.   MEDICATIONS ORDERED IN ED: Medications  acetaminophen  (TYLENOL ) tablet 650 mg (has no administration in time range)  Or  acetaminophen  (TYLENOL ) 160 MG/5ML solution 650 mg (has no administration in time range)    Or  acetaminophen  (TYLENOL ) suppository 650 mg (has no administration in time range)  senna-docusate (Senokot-S) tablet 1 tablet (has no administration in time range)  hydrALAZINE  (APRESOLINE ) injection 5 mg (has no administration in time range)  atorvastatin  (LIPITOR) tablet 40 mg (has no administration in time range)  apixaban  (ELIQUIS ) tablet 2.5 mg (has no administration in time range)  pantoprazole  (PROTONIX ) EC tablet 40 mg (has no administration in time range)  ipratropium-albuterol  (DUONEB) 0.5-2.5 (3) MG/3ML nebulizer solution 3 mL (has no administration in time range)  fluticasone  (FLONASE ) 50 MCG/ACT nasal spray 2 spray (has no administration in time range)  magnesium  oxide (MAG-OX) tablet 200 mg (has no administration in time range)  furosemide  (LASIX ) injection 40 mg (40 mg Intravenous Given 12/03/23 1117)  sodium chloride  0.9 % bolus 500 mL (0 mLs Intravenous Stopped 12/03/23 1315)   stroke: early stages of recovery book ( Does not apply Return to [facility] 12/04/23 1417)    FINAL CLINICAL IMPRESSION(S) / ED DIAGNOSES   Final diagnoses:  Weakness  Double vision  Dyspnea, unspecified type     Rx / DC Orders   ED Discharge Orders     None        Note:  This document was prepared using Dragon voice recognition software and may include unintentional dictation errors.   Suzanne Kirsch, MD 12/03/23 [phone]

## 2023-12-03 NOTE — Assessment & Plan Note (Signed)
 Eliquis 2.5 mg p.o. twice daily

## 2023-12-03 NOTE — ED Notes (Addendum)
 This RN is doing an NIH stroke scale based on speaking with the attending Physician. BEFAST negative.

## 2023-12-03 NOTE — Assessment & Plan Note (Signed)
 Status post CABG in 08/2006

## 2023-12-03 NOTE — Assessment & Plan Note (Signed)
-   Atorvastatin 40 mg daily resumed 

## 2023-12-03 NOTE — ED Notes (Signed)
 CCMD called and Pt placed on Cardiac monitoring.

## 2023-12-03 NOTE — Assessment & Plan Note (Signed)
 Status post ventricular fibrillation arrest in 1994, status post MI

## 2023-12-03 NOTE — Assessment & Plan Note (Signed)
 Home torsemide 20 mg p.o. twice daily not resumed on admission due to AKI Hydralazine 5 mg IV every 6 hours.  For SBP 170, 5 days ordered

## 2023-12-03 NOTE — Consult Note (Signed)
 NEUROLOGY CONSULT NOTE   Date of service: December 03, 2023 Patient Name: Taylor [name] MRN:  [id number] DOB:  05-29-1941 Chief Complaint: Vertical double vision Requesting Provider: Sherre Greig SAILOR, DO  History of Present Illness  83 y.o. male with a PMHx of AAA (06/03/2007), AICD (Medtronic), atrial fibrillation (on Eliquis ), Barrett's esophagus, Bladder cancer, Bradycardia, CAD, coronary angioplasty with stenting, CABG, CAP (11/13/2019), CHF, Cluster headache, COVID-19 (09/27/2021), lumbar DDD, DM, Dyspnea, Edema, Fracture of skull base (1997), GERD, Gout, History of bladder cancer (12/1995), Hyperlipidemia, Hypertension, Hypocalcemia (04/19/2020), Malignant melanoma (12/2012), Myocardial infarction, Osteoarthritis of knee, Pancreatitis (11/22/2019), Pneumonia, Psoriasis, Rib fracture (1997), Sleep apnea, left occipital lobe stroke, left carotid stenting and venous incompetence who presents to the ED with a c/c of transient double vision that lasted for about 10 minutes prior to spontaneously subsiding. Symptoms began at 0700 today. He was fine earlier this AM while eating breakfast and walking around. He sat down to watch TV and suddenly the images on the TV appeared doubled along the vertical visual axis (images overlapping with one on top of the other). He looked at his wife and saw two images of her, also overlapping and vertically displaced. EMS was called and on their arrival he appeared to be having difficulty getting up. Wife reports recent cough and congestion. He was SOB with frequent grunting in Triage, being unable to speak in long sentences without running out of breath.   He was lightheaded, described as a presyncopal sensation, in conjunction with nausea and diaphoresis during the episode of double vision. He also had a mild headache. He denies having any other symptoms in conjunction with his double vision, including no vision loss, no facial weakness, no confusion, no dysarthria, no limb  weakness, no incoordination and no numbness.    Home medications include ASA and Eliquis .    ROS  Comprehensive ROS performed and pertinent positives documented in HPI    Past History   Past Medical History:  Diagnosis Date   AAA (abdominal aortic aneurysm) (HCC) 06/03/2007   [facility]; Dr. Gerlean   AICD (automatic cardioverter/defibrillator) present    Arrhythmia    atrial fibrillation   Barrett's esophagus    Bladder cancer (HCC)    Bradycardia    CAD (coronary artery disease)    CAP (community acquired pneumonia) 11/13/2019   CHF (congestive heart failure) (HCC)    Cluster headache    COVID-19 09/27/2021   DDD (degenerative disc disease), lumbar    Diabetes mellitus without complication (HCC)    Dyspnea    WITH EXERTION   Edema    LEFT ANKLE   Fracture of skull base (HCC) 1997   due to fall   GERD (gastroesophageal reflux disease)    Gout    History of bladder cancer 12/1995   Hyperlipidemia    Hypertension    Hypocalcemia 04/19/2020   Possibly secondary to diuretics.   Low=5.9 04/19/2020   Malignant melanoma (HCC) 12/2012   right dorsal forearm excised   Myocardial infarction [facility])    LAST 2014   Osteoarthritis of knee    Other specified complication of vascular prosthetic devices, implants and grafts, initial encounter (HCC) 08/22/2021   Pacemaker 10/10/2006   Pancreatitis 11/22/2019   Pneumonia    2016   Pre-diabetes    Psoriasis    Rib fracture 1997   due to fall   Sleep apnea    CPAP   Stroke (HCC)    Venous incompetence  Past Surgical History:  Procedure Laterality Date   ABDOMINAL AORTIC ANEURYSM REPAIR  06/03/2007   [facility]; Dr. Gerlean   ANGIOPLASTY  1994   MI   BLADDER TUMOR EXCISION  12/1995   CAROTID PTA/STENT INTERVENTION Left 07/23/2022   Procedure: CAROTID PTA/STENT INTERVENTION;  Surgeon: Jama Cordella MATSU, MD;  Location: [facility] INVASIVE CV LAB;  Service: Cardiovascular;  Laterality:  Left;   CATARACT EXTRACTION W/PHACO Left 10/22/2016   Procedure: CATARACT EXTRACTION PHACO AND INTRAOCULAR LENS PLACEMENT (IOC);  Surgeon: Elsie Carmine, MD;  Location: [facility] ORS;  Service: Ophthalmology;  Laterality: Left;  US  47.7 AP% 18.4 CDE 8.78 Fluid pack lot # [id number]   CATARACT EXTRACTION W/PHACO Right 12/10/2016   Procedure: CATARACT EXTRACTION PHACO AND INTRAOCULAR LENS PLACEMENT (IOC);  Surgeon: Elsie Carmine, MD;  Location: [facility] ORS;  Service: Ophthalmology;  Laterality: Right;  US  00:39 AP% 23.3 CDE 9.13 Fluid pack lot # [id number]   CORONARY ANGIOPLASTY     STENTS X 5   CORONARY ARTERY BYPASS GRAFT  09/22/2006   four   ELBOW BURSA SURGERY     DUE TO GOUT   INSERT / REPLACE / REMOVE PACEMAKER     MELANOMA EXCISION  12/2012   Right forearm   PPM GENERATOR CHANGEOUT N/A 02/13/2023   Procedure: PPM GENERATOR CHANGEOUT;  Surgeon: Ammon Blunt, MD;  Location: [facility] INVASIVE CV LAB;  Service: Cardiovascular;  Laterality: N/A;    Family History: Family History  Problem Relation Age of Onset   Cancer Mother        Melanoma skin cancer   Heart attack Father 53   Cancer Father        throat cancer   Arthritis Brother     Social History  reports that he quit smoking about 24 years ago. His smoking use included cigarettes. He started smoking about 54 years ago. He has a 30 pack-year smoking history. He has never used smokeless tobacco. He reports that he does not drink alcohol and does not use drugs.  Allergies  Allergen Reactions   Amlodipine Besylate Swelling    Had a reaction when taking with colcrys     Crestor [Rosuvastatin]     Muscle cramps and pain. Tolerates atorvastatin     Medications   Current Facility-Administered Medications:     stroke: early stages of recovery book, , Does not apply, Once, Cox, Amy N, DO   acetaminophen  (TYLENOL ) tablet 650 mg, 650 mg, Oral, Q4H PRN **OR** acetaminophen  (TYLENOL ) 160 MG/5ML solution 650 mg, 650 mg, Per Tube,  Q4H PRN **OR** acetaminophen  (TYLENOL ) suppository 650 mg, 650 mg, Rectal, Q4H PRN, Cox, Amy N, DO   senna-docusate (Senokot-S) tablet 1 tablet, 1 tablet, Oral, QHS PRN, Cox, Amy N, DO  Current Outpatient Medications:    acetaminophen  (TYLENOL ) 500 MG tablet, Take 500 mg by mouth every 6 (six) hours as needed for mild pain., Disp: , Rfl:    albuterol  (VENTOLIN  HFA) 108 (90 Base) MCG/ACT inhaler, Inhale 2 puffs into the lungs every 6 (six) hours as needed for wheezing or shortness of breath., Disp: 8 g, Rfl: 2   apixaban  (ELIQUIS ) 2.5 MG TABS tablet, Take 1 tablet (2.5 mg total) by mouth 2 (two) times daily., Disp: 60 tablet, Rfl: 1   aspirin  81 MG EC tablet, Take 81 mg by mouth daily. , Disp: , Rfl:    atorvastatin  (LIPITOR) 40 MG tablet, TAKE 1 TABLET(40 MG) BY MOUTH DAILY, Disp: 90 tablet, Rfl: 1   clobetasol  ointment (TEMOVATE ) 0.05 %,  Apply 1 Application topically 2 (two) times daily., Disp: , Rfl:    empagliflozin  (JARDIANCE ) 10 MG TABS tablet, Take 1 tablet by mouth daily., Disp: , Rfl:    fluticasone  (FLONASE ) 50 MCG/ACT nasal spray, Place 2 sprays into both nostrils daily., Disp: 16 g, Rfl: 6   guaiFENesin  (MUCINEX  PO), Take by mouth., Disp: , Rfl:    ipratropium-albuterol  (DUONEB) 0.5-2.5 (3) MG/3ML SOLN, Inhale 3 mLs into the lungs every 4 (four) hours as needed (wheezing, shob)., Disp: 360 mL, Rfl: 1   isosorbide  mononitrate (IMDUR ) 30 MG 24 hr tablet, Take 0.5 tablets (15 mg total) by mouth daily., Disp: , Rfl:    Magnesium  250 MG TABS, Take 125 mg by mouth 2 (two) times daily., Disp: , Rfl:    metoprolol  succinate (TOPROL -XL) 25 MG 24 hr tablet, Take 1 tablet (25 mg total) by mouth daily. (Patient not taking: Reported on 10/20/2023), Disp: , Rfl:    pantoprazole  (PROTONIX ) 40 MG tablet, TAKE 1 TABLET BY MOUTH EVERY DAY, Disp: 90 tablet, Rfl: 4   potassium chloride  SA (KLOR-CON  M) 20 MEQ tablet, Take 10 mEq by mouth 2 (two) times daily., Disp: , Rfl:    torsemide  (DEMADEX ) 20 MG  tablet, Take 1 tablet (20 mg total) by mouth daily. (Patient taking differently: Take 60 mg by mouth 2 (two) times daily.), Disp: 90 tablet, Rfl: 3   triamcinolone  ointment (KENALOG ) 0.1 %, Apply topically 2 (two) times daily. APPLY TOPICALLY TWICE DAILY AS DIRECTED, Disp: 454 g, Rfl: 3  Vitals   Vitals:   07-Dec-2023 0753 December 07, 2023 1000 Dec 07, 2023 1120 December 07, 2023 1121  BP:  (!) 125/108  120/87  Pulse:  85  60  Resp:  18  (!) 23  Temp:   98 F (36.7 C)   TempSrc:   Oral   SpO2:  98%  92%  Weight: 70.3 kg     Height: 5' 10 (1.778 m)       Body mass index is 22.24 kg/m.  Physical Exam   Physical Exam HEENT- [place]/AT   Lungs- Respirations unlabored Extremities- Warm and well-perfused. Red, rough skin discoloration on arms and legs appears chronic.   Neurological Examination Mental Status: Awake and alert. Fully oriented. Thought content appropriate.  Speech fluent without evidence of aphasia.  Able to follow all commands without difficulty. Cranial Nerves: II: Temporal visual fields intact with no extinction to DSS. PERRL. III,IV, VI: No ptosis. EOMI with conjugate gaze in all directions. No exotropia or esotropia seen on lateral and vertical gaze. No nystagmus. V: Temp sensation equal bilaterally VII: Smile symmetric VIII: Hearing intact to voice IX,X: No hypophonia or hoarseness XI: Symmetric XII: Midline tongue extension Motor: RUE: 5/5 LUE: 5/5 RLE: 5/5 LLE: 5/5 Sensory: Temp and FT intact x 4. No extinction to DSS. Deep Tendon Reflexes: 2+ and symmetric bilateral brachioradialis and patellae Plantars: Right: downgoingLeft: downgoing Cerebellar: No ataxia with FNF or H-S bilaterally Gait: Deferred   Labs/Imaging/Neurodiagnostic studies   CBC:  Recent Labs  Lab 12/07/23 0802  WBC 6.0  HGB 11.9*  HCT 39.8  MCV 100.0  PLT 170   Basic Metabolic Panel:  Lab Results  Component Value Date   NA 137 2023-12-07   K 5.3 (H) 2023/12/07   CO2 24 December 07, 2023   GLUCOSE 117  (H) December 07, 2023   BUN 43 (H) 2023-12-07   CREATININE 3.05 (H) 2023-12-07   CALCIUM  9.0 2023-12-07   GFRNONAA 20 (L) Dec 07, 2023   GFRAA 60 11/21/2020   Lipid Panel:  Lab  Results  Component Value Date   LDLCALC 11 09/10/2023   HgbA1c:  Lab Results  Component Value Date   HGBA1C 6.6 (H) 09/11/2023   Urine Drug Screen: No results found for: LABOPIA, COCAINSCRNUR, LABBENZ, AMPHETMU, THCU, LABBARB  Alcohol Level No results found for: [facility] INR  Lab Results  Component Value Date   INR 1.4 (H) 09/15/2023   APTT  Lab Results  Component Value Date   APTT 93 (H) 09/15/2023   TTE 09/11/23: 1. Left ventricular ejection fraction, by estimation, is <20%. Left  ventricular ejection fraction by 2D MOD biplane is 23.9 %. Left  ventricular ejection fraction by PLAX is 20 %. The left ventricle has  severely decreased function. The left ventricle  demonstrates global hypokinesis. The left ventricular internal cavity size  was moderately to severely dilated. There is mild left ventricular  hypertrophy. Left ventricular diastolic parameters are consistent with  Grade III diastolic dysfunction  (restrictive).   2. Severe RV pressure and volume overload. RVSP falsely low due to  equalization of pressures. Right ventricular systolic function is severely  reduced. The right ventricular size is severely enlarged. There is normal  pulmonary artery systolic pressure.  The estimated right ventricular systolic pressure is 34.0 mmHg.   3. Left atrial size was severely dilated.   4. Right atrial size was severely dilated.   5. The mitral valve is degenerative. Moderate to severe mitral valve  regurgitation.   6. The tricuspid valve is degenerative. Tricuspid valve regurgitation is  severe.   7. The aortic valve is grossly normal. Aortic valve regurgitation is  mild. Aortic valve sclerosis is present, with no evidence of aortic valve  stenosis.    ASSESSMENT  83 year old male with  multiple vascular risk factors including atrial fibrillation and an old left occipital lobe stroke who presents with a transient episode of vertical double vision that subsided within 10 minutes. The patient did not cover/uncover either eye to assess whether the double vision was binocular or monocular during the spell, but symptoms are suggestive of an episode of binocular double vision.  - Neurological exam in nonfocal - Due to his pacemaker, he will not be able to have his MRI here at [facility]. If MRI is necessary he will need to be transferred to [facility]. - CT head: No acute CT finding. Age related volume loss with chronic small-vessel ischemic changes of the white matter and an old left occipital infarction. Old healed left parietal skull fracture.  - Labs: - BNP elevated to 1056. High sensitive troponin was 24 and on repeat was 26.  - BUN of 40, serum creatinine 3.05. - Potassium 5.3.  - Overall impression: Double vision was transient without any other neurological symptoms and no residual deficits on exam, which is not consistent with stroke, although transient ischemia to the midbrain due to microembolus is on the DDx. Transient ischemia to cranial nerves, III, IV or VI on the left or right is also on the DDx as he has risk factors for cranial nerve infarction (DM, HTN and advanced age)  RECOMMENDATIONS  - Continue home Eliquis  and ASA - Continue atorvastatin  - Carotid ultrasound.   - Due to AKI on CKD4, with eGFR of 20, cannot perform CTA at this time.  - Uncertain if he needs MRI acutely. He had transient double vision without any other neurological symptoms and no residual deficits on exam, which is not consistent with stroke, although transient ischemia to the midbrain due to microembolus is  on the DDx. Transient ischemia to cranial nerves, III, IV or VI on the left or right is also on the DDx as he has risk factors for cranial nerve infarction (DM, HTN and advanced age).  - He has had a  recent TTE (see above). No need to repeat at this time.  - Risks of permissive HTN outweigh benefits given his CHF with severely reduced EF - Cardiac telemetry - Management of CHF, AKI on CKD and COPD per primary team  - Recommend outpatient Neuroophthalmology evaluation at [facility] with Dr. Andrey Handler.   - PT/OT/Speech - HgbA1c, fasting lipid panel - Frequent neuro checks - NPO until passes stroke swallow screen - After carotid ultrasound, will reassess and determine if transfer to [facility] for MRI with MRA head is indicated, versus outpatient MRI/MRA.    Addendum: - Carotid ultrasound: No evidence of focal significant carotid stenosis. Changes consistent with prior left carotid stenting. - Given that he is already on Eliquis  and ASA, uncertain if MRI/MRA would change management. Will discuss with patient tomorrow AM whether he would prefer outpatient MRI versus transfer to [facility].   ______________________________________________________________________    Bonney SHARK, Alaura Schippers, MD [facility]

## 2023-12-03 NOTE — Assessment & Plan Note (Addendum)
 Neurology has been consulted and we appreciate further recommendations MRI brain w/o contrast, bilateral carotid US  ordered per neurology recommendations Fasting lipid and A1c ordered Frequent neuro vascular checks N.p.o. pending swallow study PT, OT, SLP Fall precaution

## 2023-12-03 NOTE — ED Notes (Signed)
 Pt desating while he naps. Notified RT and they will bring CPAP.

## 2023-12-04 ENCOUNTER — Other Ambulatory Visit: Payer: Self-pay

## 2023-12-04 DIAGNOSIS — R2990 Unspecified symptoms and signs involving the nervous system: Secondary | ICD-10-CM | POA: Diagnosis not present

## 2023-12-04 LAB — COMPREHENSIVE METABOLIC PANEL
ALT: 10 U/L (ref 0–44)
AST: 21 U/L (ref 15–41)
Albumin: 2.5 g/dL — ABNORMAL LOW (ref 3.5–5.0)
Alkaline Phosphatase: 120 U/L (ref 38–126)
Anion gap: 8 (ref 5–15)
BUN: 48 mg/dL — ABNORMAL HIGH (ref 8–23)
CO2: 26 mmol/L (ref 22–32)
Calcium: 8.9 mg/dL (ref 8.9–10.3)
Chloride: 101 mmol/L (ref 98–111)
Creatinine, Ser: 3.3 mg/dL — ABNORMAL HIGH (ref 0.61–1.24)
GFR, Estimated: 18 mL/min — ABNORMAL LOW (ref >60)
Glucose, Bld: 124 mg/dL — ABNORMAL HIGH (ref 70–99)
Potassium: 5.1 mmol/L (ref 3.5–5.1)
Sodium: 135 mmol/L (ref 135–145)
Total Bilirubin: 1.6 mg/dL — ABNORMAL HIGH (ref 0.0–1.2)
Total Protein: 7.3 g/dL (ref 6.5–8.1)

## 2023-12-04 LAB — LIPID PANEL
Cholesterol: 53 mg/dL (ref 0–200)
HDL: 20 mg/dL — ABNORMAL LOW (ref >40)
LDL Cholesterol: 20 mg/dL (ref 0–99)
Total CHOL/HDL Ratio: 2.7 {ratio}
Triglycerides: 63 mg/dL (ref <150)
VLDL: 13 mg/dL (ref 0–40)

## 2023-12-04 LAB — CBC
HCT: 39.5 % (ref 39.0–52.0)
Hemoglobin: 11.9 g/dL — ABNORMAL LOW (ref 13.0–17.0)
MCH: 30.6 pg (ref 26.0–34.0)
MCHC: 30.1 g/dL (ref 30.0–36.0)
MCV: 101.5 fL — ABNORMAL HIGH (ref 80.0–100.0)
Platelets: 160 10*3/uL (ref 150–400)
RBC: 3.89 MIL/uL — ABNORMAL LOW (ref 4.22–5.81)
RDW: 19.9 % — ABNORMAL HIGH (ref 11.5–15.5)
WBC: 5.5 10*3/uL (ref 4.0–10.5)
nRBC: 0 % (ref 0.0–0.2)

## 2023-12-04 MED ORDER — SODIUM CHLORIDE 0.9 % IV SOLN
1.0000 g | INTRAVENOUS | Status: DC
  Administered 2023-12-04 – 2023-12-06 (×3): 1 g via INTRAVENOUS
  Filled 2023-12-04 (×4): qty 10

## 2023-12-04 MED ORDER — MORPHINE SULFATE (PF) 2 MG/ML IV SOLN
2.0000 mg | Freq: Once | INTRAVENOUS | Status: AC
  Administered 2023-12-04: 2 mg via INTRAVENOUS
  Filled 2023-12-04: qty 1

## 2023-12-04 MED ORDER — FUROSEMIDE 10 MG/ML IJ SOLN
40.0000 mg | Freq: Once | INTRAMUSCULAR | Status: AC
  Administered 2023-12-04: 40 mg via INTRAVENOUS
  Filled 2023-12-04: qty 4

## 2023-12-04 MED ORDER — SODIUM CHLORIDE 0.9 % IV SOLN
500.0000 mg | INTRAVENOUS | Status: DC
  Administered 2023-12-04 – 2023-12-05 (×2): 500 mg via INTRAVENOUS
  Filled 2023-12-04 (×3): qty 5

## 2023-12-04 MED ORDER — GUAIFENESIN ER 600 MG PO TB12
600.0000 mg | ORAL_TABLET | Freq: Two times a day (BID) | ORAL | Status: DC
  Administered 2023-12-04 – 2023-12-07 (×7): 600 mg via ORAL
  Filled 2023-12-04 (×7): qty 1

## 2023-12-04 NOTE — Progress Notes (Addendum)
 SLP Cancellation Note  Patient Details Name: Taylor [name] MRN: [id number] DOB: 01-30-1941   Cancelled treatment:       Reason Eval/Treat Not Completed: SLP screened, no needs identified, will sign off (chart reviewed; consulted NSG and met w/ pt in room during his lunch meal. No Family present.)   Head CT: Brain: No abnormality is seen affecting the brainstem or cerebellum.  Cerebral hemispheres show age related volume loss with chronic small-vessel ischemic changes of the white matter and an old left occipital infarction.  Pt denied any difficulty swallowing and is currently on a regular diet; tolerates swallowing pills w/ water  per NSG. Pt was feeding himself his lunch meal w/ no overt issues noted but reclined in the bed -- supported pt in a more upright sitting position (for oral intake) and briefly reviewed general aspiration precautions w/ pt. Pt agreed. Pt conversed in conversation w/out expressive/receptive deficits noted; pt denied any speech-language deficits. Speech clear. 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 Pt agreed.  No further skilled ST services indicated as pt appears at his baseline. Pt agreed. NSG to reconsult if any change in status while admitted.     Comer Portugal, MS, CCC-SLP Speech Language Pathologist Rehab Services; [facility] [phone] (ascom) Eleonore Shippee 12/04/2023, 12:26 PM

## 2023-12-04 NOTE — ED Notes (Signed)
 Patient took himself off of the Bi-PAP to eat breakfast. Patient placed on his baseline 3L [place]. Patient sats in the 90s at this time.

## 2023-12-04 NOTE — ED Notes (Signed)
 RT at bedside at this time. RT agreed that patient needs to remain on Bi-PAP for now. Patient laying in bed at this time in NAD and denies other needs. Call light within reach and bed in lowest position.

## 2023-12-04 NOTE — Progress Notes (Signed)
 PROGRESS NOTE    Taylor [name]  FMW:[id number] DOB: Dec 05, 1940 DOA: 12/03/2023 PCP: [name], MD   Assessment & Plan:   Principal Problem:   Stroke-like symptom Active Problems:   CAD S/P percutaneous coronary angioplasty   History of CVA (cerebrovascular accident)   Systolic CHF with reduced left ventricular function, NYHA class 3 (HCC)   Obstructive sleep apnea   Atrial fibrillation (HCC)   Hyperbilirubinemia   Essential (primary) hypertension   Cardiac defibrillator in place   Restrictive lung disease   Other specified interstitial pulmonary diseases (HCC)   Implantable cardioverter-defibrillator (ICD) in situ   Acute kidney injury superimposed on stage 4 chronic kidney disease (HCC)  Assessment and Plan: Stroke-like symptom: CT was neg for any acute intracranial findings. PT/OT consulted. Neuro following and recs apprec.   CAP: as per CXR. Started on IV rocephin , azithromycin , bronchodilators & encourage incentive spirometry  CKDIV: Cr is labile. Avoid nephrotoxic meds    Hx of CVA: continue on eliquis , statin    Hx CAD: s/p CABG in 2007. Hx of AICD    Likely PAF: continue on eliquis .   HTN: holding home torsemide  secondary to AKI. BP is WNL    HLD: continue on statin           DVT prophylaxis: eliquis   Code Status: full  Family Communication:  Disposition Plan: depends on PT/OT recs    Level of care: Telemetry Medical Status is: Observation The patient remains OBS appropriate and will d/c before 2 midnights.    Consultants:  Neuro   Procedures: (  Antimicrobials:   Subjective: Pt c/o weakness  Objective: Vitals:   12/04/23 0500 12/04/23 0600 12/04/23 0751 12/04/23 0751  BP: 119/85 (!) 137/100  (!) 129/96  Pulse: 79 77  79  Resp: (!) 24 (!) 26  16  Temp:   (!) 97.1 F (36.2 C)   TempSrc:   Axillary   SpO2: 94% (!) 87%  90%  Weight:      Height:        Intake/Output Summary (Last 24 hours) at 12/04/2023 0900 Last data filed  at 12/03/2023 1700 Gross per 24 hour  Intake 500 ml  Output 75 ml  Net 425 ml   Filed Weights   12/03/23 0753  Weight: 70.3 kg    Examination:  General exam: Appears calm and comfortable  Respiratory system: decreased breath sounds b/l  Cardiovascular system: S1 & S2+. No  rubs, gallops or clicks.  Gastrointestinal system: Abdomen is nondistended, soft and nontender. Normal bowel sounds heard. Central nervous system: Alert and oriented.  Psychiatry: Judgement and insight appear normal. Flat mood and affect    Data Reviewed: I have personally reviewed following labs and imaging studies  CBC: Recent Labs  Lab 12/03/23 0802  WBC 6.0  HGB 11.9*  HCT 39.8  MCV 100.0  PLT 170   Basic Metabolic Panel: Recent Labs  Lab 12/03/23 0802  NA 137  K 5.3*  CL 101  CO2 24  GLUCOSE 117*  BUN 43*  CREATININE 3.05*  CALCIUM  9.0   GFR: Estimated Creatinine Clearance: 18.9 mL/min (A) (by C-G formula based on SCr of 3.05 mg/dL (H)). Liver Function Tests: Recent Labs  Lab 12/03/23 0802  AST 31  ALT 8  ALKPHOS 150*  BILITOT 1.9*  PROT 7.8  ALBUMIN  2.6*   No results for input(s): LIPASE, AMYLASE in the last 168 hours. No results for input(s): AMMONIA in the last 168 hours. Coagulation Profile: No results  for input(s): INR, PROTIME in the last 168 hours. Cardiac Enzymes: No results for input(s): CKTOTAL, CKMB, CKMBINDEX, TROPONINI in the last 168 hours. BNP (last 3 results) No results for input(s): PROBNP in the last 8760 hours. HbA1C: No results for input(s): HGBA1C in the last 72 hours. CBG: Recent Labs  Lab 12/03/23 0807  GLUCAP 102*   Lipid Profile: Recent Labs    12/04/23 0340  CHOL 53  HDL 20*  LDLCALC 20  TRIG 63  CHOLHDL 2.7   Thyroid  Function Tests: No results for input(s): TSH, T4TOTAL, FREET4, T3FREE, THYROIDAB in the last 72 hours. Anemia Panel: No results for input(s): VITAMINB12, FOLATE, FERRITIN, TIBC,  IRON, RETICCTPCT in the last 72 hours. Sepsis Labs: Recent Labs  Lab 12/03/23 0957  PROCALCITON 0.19    Recent Results (from the past 240 hours)  Resp panel by RT-PCR (RSV, Flu A&B, Covid) Anterior Nasal Swab     Status: None   Collection Time: 12/03/23 11:19 AM   Specimen: Anterior Nasal Swab  Result Value Ref Range Status   SARS Coronavirus 2 by RT PCR NEGATIVE NEGATIVE Final    Comment: (NOTE) SARS-CoV-2 target nucleic acids are NOT DETECTED.  The SARS-CoV-2 RNA is generally detectable in upper respiratory specimens during the acute phase of infection. The lowest concentration of SARS-CoV-2 viral copies this assay can detect is 138 copies/mL. A negative result does not preclude SARS-Cov-2 infection and should not be used as the sole basis for treatment or other patient management decisions. A negative result may occur with  improper specimen collection/handling, submission of specimen other than nasopharyngeal swab, presence of viral mutation(s) within the areas targeted by this assay, and inadequate number of viral copies(<138 copies/mL). A negative result must be combined with clinical observations, patient history, and epidemiological information. The expected result is Negative.  Fact Sheet for Patients:  bloggercourse.com  Fact Sheet for Healthcare Providers:  seriousbroker.it  This test is no t yet approved or cleared by the [place]  FDA and  has been authorized for detection and/or diagnosis of SARS-CoV-2 by FDA under an Emergency Use Authorization (EUA). This EUA will remain  in effect (meaning this test can be used) for the duration of the COVID-19 declaration under Section 564(b)(1) of the Act, 21 U.S.C.section 360bbb-3(b)(1), unless the authorization is terminated  or revoked sooner.       Influenza A by PCR NEGATIVE NEGATIVE Final   Influenza B by PCR NEGATIVE NEGATIVE Final    Comment:  (NOTE) The Xpert Xpress SARS-CoV-2/FLU/RSV plus assay is intended as an aid in the diagnosis of influenza from Nasopharyngeal swab specimens and should not be used as a sole basis for treatment. Nasal washings and aspirates are unacceptable for Xpert Xpress SARS-CoV-2/FLU/RSV testing.  Fact Sheet for Patients: bloggercourse.com  Fact Sheet for Healthcare Providers: seriousbroker.it  This test is not yet approved or cleared by the [place]  FDA and has been authorized for detection and/or diagnosis of SARS-CoV-2 by FDA under an Emergency Use Authorization (EUA). This EUA will remain in effect (meaning this test can be used) for the duration of the COVID-19 declaration under Section 564(b)(1) of the Act, 21 U.S.C. section 360bbb-3(b)(1), unless the authorization is terminated or revoked.     Resp Syncytial Virus by PCR NEGATIVE NEGATIVE Final    Comment: (NOTE) Fact Sheet for Patients: bloggercourse.com  Fact Sheet for Healthcare Providers: seriousbroker.it  This test is not yet approved or cleared by the [place]  FDA and has been authorized for detection and/or  diagnosis of SARS-CoV-2 by FDA under an Emergency Use Authorization (EUA). This EUA will remain in effect (meaning this test can be used) for the duration of the COVID-19 declaration under Section 564(b)(1) of the Act, 21 U.S.C. section 360bbb-3(b)(1), unless the authorization is terminated or revoked.  Performed at [facility], [address] [place], [place] [address]          Radiology Studies: US  Carotid Bilateral Result Date: 12/03/2023 CLINICAL DATA:  Weakness and stroke-like symptoms, initial encounter EXAM: BILATERAL CAROTID DUPLEX ULTRASOUND TECHNIQUE: Elnor scale imaging, color Doppler and duplex ultrasound were performed of bilateral carotid and vertebral arteries in the neck.  COMPARISON:  04/24/2022 FINDINGS: Criteria: Quantification of carotid stenosis is based on velocity parameters that correlate the residual internal carotid diameter with NASCET-based stenosis levels, using the diameter of the distal internal carotid lumen as the denominator for stenosis measurement. The following velocity measurements were obtained: RIGHT ICA: 36/20 cm/sec CCA: 37/10 cm/sec SYSTOLIC ICA/CCA RATIO:  1.0 ECA: 18 cm/sec LEFT ICA: 43/19 cm/sec CCA: 36/13 cm/sec SYSTOLIC ICA/CCA RATIO:  1.2 ECA: 42 cm/sec RIGHT CAROTID ARTERY: Preliminary grayscale images demonstrate scattered atherosclerotic plaque. The waveforms, velocities and flow velocity ratios however show no evidence of focal hemodynamically significant stenosis. RIGHT VERTEBRAL ARTERY:  Antegrade in nature. LEFT CAROTID ARTERY: Preliminary grayscale images demonstrate scattered common carotid plaque. A stent is noted extending from the distal common carotid artery into the proximal internal carotid artery consistent with the given clinical history. No significant in stent stenosis is noted. LEFT VERTEBRAL ARTERY:  Antegrade in nature. IMPRESSION: No evidence of focal significant carotid stenosis. Changes consistent with prior left carotid stenting. Electronically Signed   By: Oneil Devonshire M.D.   On: 12/03/2023 17:34   DG Chest 2 View Result Date: 12/03/2023 CLINICAL DATA:  Shortness of breath. EXAM: CHEST - 2 VIEW COMPARISON:  09/10/2023. FINDINGS: There is mild-to-moderate diffuse pulmonary vascular congestion. There is right basilar opacity likely combination of right lung atelectasis and/or consolidation with small right pleural effusion. No significant left pleural effusion. No pneumothorax on either side. Stable moderately enlarged cardio-mediastinal silhouette. There is a left sided 3-lead pacemaker. There are surgical staples along the heart border and sternotomy wires, status post CABG (coronary artery bypass graft). No acute osseous  abnormalities. The soft tissues are within normal limits. IMPRESSION: *Findings favor congestive heart failure/pulmonary edema. *Right basilar opacity, as described above. Electronically Signed   By: Ree Molt M.D.   On: 12/03/2023 08:45   CT HEAD WO CONTRAST ( ) Result Date: 12/03/2023 CLINICAL DATA:  Weakness.  Double vision.  Nausea.  Diaphoresis. EXAM: CT HEAD WITHOUT CONTRAST TECHNIQUE: Contiguous axial images were obtained from the base of the skull through the vertex without intravenous contrast. RADIATION DOSE REDUCTION: This exam was performed according to the departmental dose-optimization program which includes automated exposure control, adjustment of the mA and/or kV according to patient size and/or use of iterative reconstruction technique. COMPARISON:  09/10/2023 FINDINGS: Brain: No abnormality is seen affecting the brainstem or cerebellum. Cerebral hemispheres show age related volume loss with chronic small-vessel ischemic changes of the white matter and an old left occipital infarction. No sign of acute infarction, mass lesion, hemorrhage, hydrocephalus or extra-axial collection. Vascular: There is atherosclerotic calcification of the major vessels at the base of the brain. Skull: Old healed left parietal skull fracture. Sinuses/Orbits: Mild mucosal thickening of the paranasal sinuses, improved since the study of October. Orbits negative. Other: None IMPRESSION: 1. No acute CT finding. Age related  volume loss with chronic small-vessel ischemic changes of the white matter and an old left occipital infarction. 2. Old healed left parietal skull fracture. 3. Mild mucosal thickening of the paranasal sinuses, improved since the study of October. Electronically Signed   By: Oneil Officer M.D.   On: 12/03/2023 08:35        Scheduled Meds:  [COMPLETED]  stroke: early stages of recovery book   Does not apply Once    stroke: early stages of recovery book   Does not apply Once   apixaban   2.5  mg Oral BID   atorvastatin   40 mg Oral Daily   magnesium  oxide  200 mg Oral BID   pantoprazole   40 mg Oral Daily   Continuous Infusions:   LOS: 0 days        Anthony CHRISTELLA Pouch, MD [facility] Pager [phone]  If 7PM-7AM, please contact night-coverage [email or web address] 12/04/2023, 9:00 AM

## 2023-12-04 NOTE — Care Management Obs Status (Signed)
 MEDICARE OBSERVATION STATUS NOTIFICATION   Patient Details  Name: Taylor [name] MRN: [id number] Date of Birth: 07/16/1941   Medicare Observation Status Notification Given:  Yes    Sherilyn Banker 12/04/2023, 3:43 PM

## 2023-12-04 NOTE — Progress Notes (Signed)
 Heart Failure Navigator Progress Note  Assessed for Heart & Vascular TOC clinic readiness.  Patient does not meet criteria due to [facility] patient.  Navigator will sign off at this time.   Roxy Horseman, RN, BSN [facility] Heart Failure Navigator Secure Chat Only

## 2023-12-04 NOTE — Progress Notes (Signed)
 OT Cancellation Note  Patient Details Name: Taylor [name] MRN: [id number] DOB: 25-Mar-1941   Cancelled Treatment:    Reason Eval/Treat Not Completed: Patient not medically ready. Chart reviewed. Pt remains on Bi-PAP, will hold until medically ready to initiate therapy evaluation.   Elston Slot, M.S. OTR/L  12/04/23, 9:46 AM  ascom [phone]

## 2023-12-04 NOTE — Patient Outreach (Signed)
  Care Management   Visit Note  12/04/2023 Name: HALO LASKI MRN: [id number] DOB: 1941-08-19  Subjective: REIGN BARTNICK is a 83 y.o. year old male who is a primary care patient of [name], Nancyann FORBES, MD. The Care Management team was consulted for assistance.      Brief outreach with Mr. Oyama spouse/Patricia. Reports he was transported to [facility] Emergency Room on yesterday after reporting symptoms of double vision, nausea, sweating and weakness.   Reports being unsure of plan for possible hospital admission. Will update the [facility] Liaison if he is admitted.   PLAN Will update the [facility] Liaison if Mr. Wiebelhaus is admitted. Follow up pending discharge/disposition.   [facility]  [facility], [facility] RN Care Manager Direct Dial: [phone] Website: [email or web address]

## 2023-12-04 NOTE — ED Notes (Signed)
 Patient stated to this RN that he only wears the Bi-PAP at night and it is time to take it off. This RN asked patient how many liters he normally wears on the nasal cannula and he stated 2.5. This RN took patient off of Bi-PAP and placed him on 3L nasal cannula. Patient sats dropped to the high 70s-low 80s. This RN increased patient Gay to 6L with no increase in O2 sats. This RN informed the patient that he must remain on the Bi-PAP for now, due to low oxygen  saturations. Patient verbalized understanding. This RN called RT to update about the patient and have the patient be seen at bedside.

## 2023-12-04 NOTE — ED Notes (Signed)
 Patient is resting comfortably.

## 2023-12-04 NOTE — Progress Notes (Signed)
 S: No further episodes of double vision. He is complaining of SOB.   O: BP 106/87   Pulse 79   Temp 97.6 F (36.4 C) (Oral)   Resp 18   Ht 5' 10 (1.778 m)   Wt 70.3 kg   SpO2 97%   BMI 22.24 kg/m   Ment: Awake and alert. No aphasia CN: EOMI. No double vision. Face symmetric. Motor: Nonfocal Cerebellar: No ataxia noted Gait: Deferred  A/R:  83 year old male with multiple vascular risk factors including atrial fibrillation and an old left occipital lobe stroke who presents with a transient episode of vertical double vision that subsided within 10 minutes. Overall symptoms are suggestive of an episode of isolated binocular double vision.  - Overall impression: Double vision was transient without any other neurological symptoms and no residual deficits on exam, which does not militate strongly in favor of stroke, although transient ischemia to the midbrain due to microembolus is on the DDx. Transient ischemia to cranial nerves, III, IV or VI on the left or right is also on the DDx as he has risk factors for cranial nerve infarction (DM, HTN and advanced age)  - No further episodes of double vision overnight.  - Neurological exam today is nonfocal  - Other than MRI/MRA, is stroke work up is complete - Given that he is already on Eliquis  and ASA, MRI/MRA would be unlikely to change management. Turning his pacemaker off and back on for MRI is not without risks.   Recommend outpatient Neuroophthalmology evaluation at [facility] with Dr. Andrey Handler.   - From a Neurological standpoint, he can be discharged with outpatient follow up. - Neurohospitalist service will sign off. Please call if there are additional questions. .   Electronically signed: Dr. Gregorey Nabor

## 2023-12-04 NOTE — Evaluation (Signed)
 Physical Therapy Evaluation Patient Details Name: Taylor [name] MRN: [id number] DOB: March 11, 1941 Today's Date: 12/04/2023  History of Present Illness  Taylor [name] is an 82yoM who comes to [facility] 12/03/23 after sudden onset weakness, dizziness, diplopia at home. Pt seen by neurology same day. PMH: CHF, A-fib on Eliquis , AICD in place, type II diabetic, hypertension, AAA, ascites from decompensated liver cirrhosis, COPD.  Clinical Impression  Pt on bipap earlier in morning, now stable on 3L/min. Pt is distended in ABD. Pt able to performed labored bed mobility and transfers without physical assistance, but his ability to take steps and maintain balance is nil. Pt given close support to try some stepping at bedside, sats remain WNL. Will advance to AMB with RW next session. Pt reports he does to feel like he will need STR at DC, but will be able to safely DC back to home. Pt on 3L/min throught, whereas he is on O2 PRN at baseline.       If plan is discharge home, recommend the following: A little help with walking and/or transfers;Assistance with cooking/housework;Help with stairs or ramp for entrance;Assist for transportation;Direct supervision/assist for financial management   Can travel by private vehicle        Equipment Recommendations None recommended by PT  Recommendations for Other Services       Functional Status Assessment Patient has had a recent decline in their functional status and demonstrates the ability to make significant improvements in function in a reasonable and predictable amount of time.     Precautions / Restrictions Precautions Precautions: Fall Restrictions Weight Bearing Restrictions Per Provider Order: No      Mobility  Bed Mobility Overal bed mobility: Needs Assistance Bed Mobility: Supine to Sit, Sit to Supine     Supine to sit: Supervision Sit to supine: Supervision   General bed mobility comments: belly distended and tight    Transfers Overall  transfer level: Needs assistance Equipment used: None Transfers: Sit to/from Stand Sit to Stand: Contact guard assist, Supervision           General transfer comment: from ED gurney and from guest chair, moderate ffort required    Ambulation/Gait Ambulation/Gait assistance: Contact guard assist, Min assist Gait Distance (Feet): 5 Feet Assistive device: 1 person hand held assist         General Gait Details: unsteady, likely simialr to baselien where he ususally uses a RW; pt able to alternate fwd/backward at bedside a few times, several LOB, reports to feel generally ok  Stairs            Wheelchair Mobility     Tilt Bed    Modified Rankin (Stroke Patients Only)       Balance                                             Pertinent Vitals/Pain Pain Assessment Pain Assessment: No/denies pain    Home Living Family/patient expects to be discharged to:: Private residence Living Arrangements: Spouse/significant other Available Help at Discharge: Family;Available 24 hours/day Type of Home: House Home Access: Stairs to enter Entrance Stairs-Rails: Can reach both Entrance Stairs-Number of Steps: 3 @ front; 2 in garage without rails   Home Layout: One level Home Equipment: Agricultural Consultant (2 wheels);Adaptive equipment Additional Comments: finished HHPT recently, is scheduled to begin cardiac rehab soon; uses RW for household AMB; has  a staionary    Prior Function Prior Level of Function : History of Falls (last six months)             Mobility Comments: reports no AD prior to recent falls, currently RW for limited community distances ADLs Comments: Ind with ADLs     Extremity/Trunk Assessment                Communication      Cognition                                                General Comments      Exercises     Assessment/Plan    PT Assessment Patient needs continued PT services  PT Problem  List Decreased strength;Decreased activity tolerance;Decreased balance;Decreased mobility       PT Treatment Interventions Patient/family education;DME instruction;Gait training;Stair training;Functional mobility training;Therapeutic activities;Therapeutic exercise;Balance training    PT Goals (Current goals can be found in the Care Plan section)  Acute Rehab PT Goals Patient Stated Goal: return to home, regain strength, be able to start cardiac rehab soon as scheduled PT Goal Formulation: With patient Time For Goal Achievement: 12/18/23 Potential to Achieve Goals: Fair    Frequency Min 1X/week     Co-evaluation               AM-PAC PT 6 Clicks Mobility  Outcome Measure Help needed turning from your back to your side while in a flat bed without using bedrails?: A Little Help needed moving from lying on your back to sitting on the side of a flat bed without using bedrails?: A Little Help needed moving to and from a bed to a chair (including a wheelchair)?: A Little Help needed standing up from a chair using your arms (e.g., wheelchair or bedside chair)?: A Little Help needed to walk in hospital room?: A Little Help needed climbing 3-5 steps with a railing? : A Lot 6 Click Score: 17    End of Session Equipment Utilized During Treatment: Oxygen  Activity Tolerance: Patient tolerated treatment well;Patient limited by lethargy Patient left: in bed;with call bell/phone within reach Nurse Communication: Mobility status PT Visit Diagnosis: Unsteadiness on feet (R26.81);Other abnormalities of gait and mobility (R26.89)    Time: 8699-8675 PT Time Calculation (min) (ACUTE ONLY): 24 min   Charges:   PT Evaluation $PT Eval Low Complexity: 1 Low   PT General Charges $$ ACUTE PT VISIT: 1 Visit        4:10 PM, 12/04/23 Taylor [name], PT, DPT Physical Therapist - [facility]  [phone] (ASCOM)    Kristy Catoe C 12/04/2023, 4:07  PM

## 2023-12-05 DIAGNOSIS — G4733 Obstructive sleep apnea (adult) (pediatric): Secondary | ICD-10-CM | POA: Diagnosis present

## 2023-12-05 DIAGNOSIS — Z8249 Family history of ischemic heart disease and other diseases of the circulatory system: Secondary | ICD-10-CM | POA: Diagnosis not present

## 2023-12-05 DIAGNOSIS — J189 Pneumonia, unspecified organism: Secondary | ICD-10-CM | POA: Diagnosis present

## 2023-12-05 DIAGNOSIS — I5020 Unspecified systolic (congestive) heart failure: Secondary | ICD-10-CM | POA: Diagnosis not present

## 2023-12-05 DIAGNOSIS — N184 Chronic kidney disease, stage 4 (severe): Secondary | ICD-10-CM | POA: Diagnosis not present

## 2023-12-05 DIAGNOSIS — Z7984 Long term (current) use of oral hypoglycemic drugs: Secondary | ICD-10-CM | POA: Diagnosis not present

## 2023-12-05 DIAGNOSIS — Z8616 Personal history of COVID-19: Secondary | ICD-10-CM | POA: Diagnosis not present

## 2023-12-05 DIAGNOSIS — I130 Hypertensive heart and chronic kidney disease with heart failure and stage 1 through stage 4 chronic kidney disease, or unspecified chronic kidney disease: Secondary | ICD-10-CM | POA: Diagnosis not present

## 2023-12-05 DIAGNOSIS — E1122 Type 2 diabetes mellitus with diabetic chronic kidney disease: Secondary | ICD-10-CM | POA: Diagnosis not present

## 2023-12-05 DIAGNOSIS — A419 Sepsis, unspecified organism: Secondary | ICD-10-CM | POA: Diagnosis not present

## 2023-12-05 DIAGNOSIS — J8489 Other specified interstitial pulmonary diseases: Secondary | ICD-10-CM | POA: Diagnosis present

## 2023-12-05 DIAGNOSIS — N179 Acute kidney failure, unspecified: Secondary | ICD-10-CM | POA: Diagnosis present

## 2023-12-05 DIAGNOSIS — I714 Abdominal aortic aneurysm, without rupture, unspecified: Secondary | ICD-10-CM | POA: Diagnosis not present

## 2023-12-05 DIAGNOSIS — I5022 Chronic systolic (congestive) heart failure: Secondary | ICD-10-CM | POA: Diagnosis present

## 2023-12-05 DIAGNOSIS — I480 Paroxysmal atrial fibrillation: Secondary | ICD-10-CM | POA: Diagnosis present

## 2023-12-05 DIAGNOSIS — Z8551 Personal history of malignant neoplasm of bladder: Secondary | ICD-10-CM | POA: Diagnosis not present

## 2023-12-05 DIAGNOSIS — Z85820 Personal history of malignant melanoma of skin: Secondary | ICD-10-CM | POA: Diagnosis not present

## 2023-12-05 DIAGNOSIS — R17 Unspecified jaundice: Secondary | ICD-10-CM | POA: Diagnosis present

## 2023-12-05 DIAGNOSIS — E785 Hyperlipidemia, unspecified: Secondary | ICD-10-CM | POA: Diagnosis not present

## 2023-12-05 DIAGNOSIS — Z7901 Long term (current) use of anticoagulants: Secondary | ICD-10-CM | POA: Diagnosis not present

## 2023-12-05 DIAGNOSIS — Z79899 Other long term (current) drug therapy: Secondary | ICD-10-CM | POA: Diagnosis not present

## 2023-12-05 DIAGNOSIS — E7800 Pure hypercholesterolemia, unspecified: Secondary | ICD-10-CM | POA: Diagnosis present

## 2023-12-05 DIAGNOSIS — H532 Diplopia: Secondary | ICD-10-CM | POA: Diagnosis present

## 2023-12-05 DIAGNOSIS — I4891 Unspecified atrial fibrillation: Secondary | ICD-10-CM | POA: Diagnosis not present

## 2023-12-05 DIAGNOSIS — J158 Pneumonia due to other specified bacteria: Secondary | ICD-10-CM | POA: Diagnosis not present

## 2023-12-05 DIAGNOSIS — J9621 Acute and chronic respiratory failure with hypoxia: Secondary | ICD-10-CM | POA: Diagnosis present

## 2023-12-05 DIAGNOSIS — Z87891 Personal history of nicotine dependence: Secondary | ICD-10-CM | POA: Diagnosis not present

## 2023-12-05 DIAGNOSIS — Z1152 Encounter for screening for COVID-19: Secondary | ICD-10-CM | POA: Diagnosis not present

## 2023-12-05 DIAGNOSIS — I2510 Atherosclerotic heart disease of native coronary artery without angina pectoris: Secondary | ICD-10-CM | POA: Diagnosis not present

## 2023-12-05 DIAGNOSIS — J440 Chronic obstructive pulmonary disease with acute lower respiratory infection: Secondary | ICD-10-CM | POA: Diagnosis not present

## 2023-12-05 DIAGNOSIS — R6520 Severe sepsis without septic shock: Secondary | ICD-10-CM | POA: Diagnosis not present

## 2023-12-05 DIAGNOSIS — Z9981 Dependence on supplemental oxygen: Secondary | ICD-10-CM | POA: Diagnosis not present

## 2023-12-05 LAB — BASIC METABOLIC PANEL
Anion gap: 11 (ref 5–15)
BUN: 50 mg/dL — ABNORMAL HIGH (ref 8–23)
CO2: 23 mmol/L (ref 22–32)
Calcium: 8.7 mg/dL — ABNORMAL LOW (ref 8.9–10.3)
Chloride: 103 mmol/L (ref 98–111)
Creatinine, Ser: 3.34 mg/dL — ABNORMAL HIGH (ref 0.61–1.24)
GFR, Estimated: 18 mL/min — ABNORMAL LOW (ref >60)
Glucose, Bld: 94 mg/dL (ref 70–99)
Potassium: 5 mmol/L (ref 3.5–5.1)
Sodium: 137 mmol/L (ref 135–145)

## 2023-12-05 LAB — CBC
HCT: 40.6 % (ref 39.0–52.0)
Hemoglobin: 11.8 g/dL — ABNORMAL LOW (ref 13.0–17.0)
MCH: 30 pg (ref 26.0–34.0)
MCHC: 29.1 g/dL — ABNORMAL LOW (ref 30.0–36.0)
MCV: 103.3 fL — ABNORMAL HIGH (ref 80.0–100.0)
Platelets: 158 10*3/uL (ref 150–400)
RBC: 3.93 MIL/uL — ABNORMAL LOW (ref 4.22–5.81)
RDW: 19.9 % — ABNORMAL HIGH (ref 11.5–15.5)
WBC: 6 10*3/uL (ref 4.0–10.5)
nRBC: 0 % (ref 0.0–0.2)

## 2023-12-05 MED ORDER — MAGIC MOUTHWASH W/LIDOCAINE
5.0000 mL | Freq: Three times a day (TID) | ORAL | Status: DC | PRN
  Filled 2023-12-05: qty 5

## 2023-12-05 NOTE — Progress Notes (Signed)
 Occupational Therapy * Physical Therapy * Speech Therapy  DATE 12/05/23 PATIENT NAME Taylor [name] PATIENT MRN [id number]   DIAGNOSIS/DIAGNOSIS CODE R29.90  DATE OF DISCHARGE 12/05/23  PRIMARY CARE PHYSICIAN Dr. Gasper PCP PHONE/FAX [phone]  Dear Provider (Name: [facility] Fax: [phone]):   I certify that I have examined this patient and that occupational/physical/speech therapy is necessary on an outpatient basis.    The patient has expressed interest in completing their recommended course of therapy at your location.  Once a formal order from the patient's primary care physician has been obtained, please contact him/her to schedule an appointment for evaluation at your earliest convenience.  [ x ]  Physical Therapy Evaluate and Treat   The patient's primary care physician (listed above) must furnish and be responsible for a formal order such that the recommended services may be furnished while under the primary physician's care, and that the plan of care will be established and reviewed every 30 days (or more often if condition necessitates).

## 2023-12-05 NOTE — Evaluation (Signed)
 Occupational Therapy Evaluation Patient Details Name: Taylor [name] MRN: [id number] DOB: 04/17/1941 Today's Date: 12/05/2023   History of Present Illness Dionisios Ricci is an 82yoM who comes to [facility] 12/03/23 after sudden onset weakness, dizziness, diplopia at home. Pt seen by neurology same day. PMH: CHF, A-fib on Eliquis , AICD in place, type II diabetic, hypertension, AAA, ascites from decompensated liver cirrhosis, COPD.   Clinical Impression   Chart reviewed, pt greeted in bed, alert and oriented x4, fair safety awareness, agreeable to OT evaluation. PTA pt reports generally MOD I-I in Adl/IADL, however had a recent rehab stay in October per chart review. Pt presents with deficits in activity tolerance, strength, cognition, affecting safe and optimal ADL completion. Pt performs bed mobility with supervision, STS with supervision-CGA, amb in room with supervision-CGA, standing grooming tasks with supervision with RW. Pt will benefit from acute OT to address deficits and to facilitate optimal ADL performance, OT wil follow acutely.      If plan is discharge home, recommend the following: A little help with walking and/or transfers;A little help with bathing/dressing/bathroom    Functional Status Assessment  Patient has had a recent decline in their functional status and demonstrates the ability to make significant improvements in function in a reasonable and predictable amount of time.  Equipment Recommendations  None recommended by OT    Recommendations for Other Services       Precautions / Restrictions Precautions Precautions: Fall Restrictions Weight Bearing Restrictions Per Provider Order: No      Mobility Bed Mobility Overal bed mobility: Needs Assistance Bed Mobility: Supine to Sit, Sit to Supine     Supine to sit: Supervision Sit to supine: Supervision        Transfers Overall transfer level: Needs assistance Equipment used: Rolling walker (2 wheels) Transfers: Sit  to/from Stand Sit to Stand: Supervision, Contact guard assist, From elevated surface                  Balance Overall balance assessment: Needs assistance Sitting-balance support: Feet unsupported Sitting balance-Leahy Scale: Good     Standing balance support: Bilateral upper extremity supported Standing balance-Leahy Scale: Good                             ADL either performed or assessed with clinical judgement   ADL Overall ADL's : Needs assistance/impaired Eating/Feeding: Set up;Sitting   Grooming: Set up;Sitting           Upper Body Dressing : Sitting;Contact guard assist   Lower Body Dressing: Minimal assistance;Sit to/from stand   Toilet Transfer: Contact guard assist;Rolling walker (2 wheels)   Toileting- Clothing Manipulation and Hygiene: Supervision/safety;Sitting/lateral lean       Functional mobility during ADLs: Supervision/safety;Contact guard assist;Rolling walker (2 wheels) (approx 37' with RW)       Vision Patient Visual Report: No change from baseline Vision Assessment?: Yes Eye Alignment: Within Functional Limits Ocular Range of Motion: Within Functional Limits Alignment/Gaze Preference: Within Defined Limits Tracking/Visual Pursuits: Able to track stimulus in all quads without difficulty Saccades: Within functional limits Convergence: Within functional limits Visual Fields: No apparent deficits     Perception         Praxis         Pertinent Vitals/Pain Pain Assessment Pain Assessment: No/denies pain     Extremity/Trunk Assessment Upper Extremity Assessment Upper Extremity Assessment: Overall WFL for tasks assessed (pt reports no ongoing issues with L- wrist; ROM  grossly appears [facility] for functional tasks)   Lower Extremity Assessment Lower Extremity Assessment: Generalized weakness   Cervical / Trunk Assessment Cervical / Trunk Assessment: Normal   Communication Communication Communication: Hearing  impairment Cueing Techniques: Verbal cues;Tactile cues   Cognition Arousal: Alert Behavior During Therapy: WFL for tasks assessed/performed Overall Cognitive Status: No family/caregiver present to determine baseline cognitive functioning Area of Impairment: Safety/judgement                         Safety/Judgement: Decreased awareness of deficits           General Comments  No reports of SOB throughout    Exercises Other Exercises Other Exercises: edu re: role of OT, role of rehab, discharge recommendations   Shoulder Instructions      Home Living Family/patient expects to be discharged to:: Private residence Living Arrangements: Spouse/significant other Available Help at Discharge: Family;Available 24 hours/day Type of Home: House Home Access: Stairs to enter [employer] of Steps: 3 @ front; 2 in garage without rails Entrance Stairs-Rails: Can reach both Home Layout: One level     Bathroom Shower/Tub: Tub/shower unit;Walk-in shower         Home Equipment: Agricultural Consultant (2 wheels);Adaptive equipment Adaptive Equipment: Sock aid Additional Comments: finished HHPT recently, is scheduled to begin cardiac rehab soon; uses RW for household AMB; has a staionary      Prior Functioning/Environment Prior Level of Function : History of Falls (last six months)             Mobility Comments: reports no AD prior to recent falls, currently RW for limited community distances ADLs Comments: Ind with ADLs        OT Problem List: Decreased strength;Decreased activity tolerance;Decreased knowledge of use of DME or AE;Decreased safety awareness      OT Treatment/Interventions: Self-care/ADL training;DME and/or AE instruction;Therapeutic activities;Therapeutic exercise;Patient/family education    OT Goals(Current goals can be found in the care plan section) Acute Rehab OT Goals Patient Stated Goal: go home OT Goal Formulation: With patient Time For  Goal Achievement: 12/19/23 Potential to Achieve Goals: Good ADL Goals Pt Will Perform Grooming: with modified independence Pt Will Perform Lower Body Dressing: with modified independence;sitting/lateral leans Pt Will Transfer to Toilet: with modified independence;ambulating  OT Frequency: Min 1X/week    Co-evaluation              AM-PAC OT 6 Clicks Daily Activity     Outcome Measure Help from another person eating meals?: None Help from another person taking care of personal grooming?: None Help from another person toileting, which includes using toliet, bedpan, or urinal?: None Help from another person bathing (including washing, rinsing, drying)?: A Little Help from another person to put on and taking off regular upper body clothing?: None Help from another person to put on and taking off regular lower body clothing?: A Little 6 Click Score: 22   End of Session Equipment Utilized During Treatment: Rolling walker (2 wheels)  Activity Tolerance: Patient tolerated treatment well Patient left: in bed;with call bell/phone within reach  OT Visit Diagnosis: Other abnormalities of gait and mobility (R26.89);Muscle weakness (generalized) (M62.81)                Time: 9149-9088 OT Time Calculation (min): 21 min Charges:  OT General Charges $OT Visit: 1 Visit OT Evaluation $OT Eval Low Complexity: 1 Low  Therisa Sheffield, OTD OTR/L  12/05/23, 12:38 PM

## 2023-12-05 NOTE — Plan of Care (Signed)

## 2023-12-05 NOTE — TOC Initial Note (Signed)
 Transition of Care [facility]) - Initial/Assessment Note    Patient Details  Name: Taylor [name] MRN: [id number] Date of Birth: July 02, 1941  [facility]) CM/SW Contact:    Vidal Lampkins E Yalena Colon, LCSW Phone Number: 12/05/2023, 12:25 PM  Clinical Narrative:                 CSW spoke with patient at bedside in the ED.  Patient is from home with spouse who will transport when discharged. PCP is Dr. Gasper. [facility]acy is [facility]. Patient has a 2WW at home.  CSW explained PT rec for Home Health. Patient states he recently finished HH with [facility]. Patient states he is already set up with [facility] OP [facility] as well as Cardiac Rehab on the [employer]. He would like to do this instead of HH. Patient denies additional TOC needs.  Expected Discharge Plan: OP Rehab Barriers to Discharge: Continued Medical Work up   Patient Goals and CMS Choice Patient states their goals for this hospitalization and ongoing recovery are:: declines HH CMS Medicare.gov Compare Post Acute Care list provided to:: Patient Choice offered to / list presented to : Patient      Expected Discharge Plan and Services       Living arrangements for the past 2 months: Single Family Home                                      Prior Living Arrangements/Services Living arrangements for the past 2 months: Single Family Home Lives with:: Spouse Patient language and need for interpreter reviewed:: Yes Do you feel safe going back to the place where you live?: Yes      Need for Family Participation in Patient Care: Yes (Comment) Care giver support system in place?: Yes (comment) Current home services: DME Criminal Activity/Legal Involvement Pertinent to Current Situation/Hospitalization: No - Comment as needed  Activities of Daily Living      Permission Sought/Granted Permission sought to share information with : Facility Industrial/product Designer granted to share information with :  Yes, Verbal Permission Granted     Permission granted to share info w AGENCY: [facility] OP Rehab        Emotional Assessment       Orientation: : Oriented to Self, Oriented to Place, Oriented to  Time, Oriented to Situation Alcohol / Substance Use: Not Applicable Psych Involvement: No (comment)  Admission diagnosis:  Stroke-like symptom [R29.90] Weakness [R53.1] Patient Active Problem List   Diagnosis Date Noted   Stroke-like symptom 12/03/2023   Psoriasis 10/20/2023   Septic shock (HCC) 09/10/2023   Acute renal failure superimposed on stage 4 chronic kidney disease (HCC) 09/10/2023   Chronic combined systolic and diastolic CHF (congestive heart failure) (HCC) 09/10/2023   Hypothermia 09/10/2023   Hyperkalemia 09/10/2023   Myocardial injury 09/10/2023   Acute kidney injury superimposed on stage 4 chronic kidney disease (HCC) 09/10/2023   Hypotension 09/10/2023   Liver cirrhosis (HCC) 09/10/2023   Radial styloid fracture_left 09/10/2023   HCAP (healthcare-associated pneumonia) 09/10/2023   Anasarca 04/29/2023   Abdominal distension 04/29/2023   Fall 04/29/2023   Acute on chronic congestive heart failure (HCC) 04/23/2023   Abdominal ascites 02/25/2023   Hyperbilirubinemia 02/25/2023   Chronic obstructive pulmonary disease, unspecified COPD type (HCC) 01/03/2023   Severe mitral regurgitation 10/31/2022   HFrEF (heart failure with reduced ejection fraction) (HCC) 10/31/2022   Weakness 10/30/2022  Acute pulmonary edema (HCC) 10/30/2022   DOE (dyspnea on exertion) 10/30/2022   Hypoxia 10/30/2022   Abdominal aortic aneurysm (AAA) (HCC) 10/30/2022   Dyspnea 10/30/2022   Orthopnea 10/15/2022   Acute on chronic combined systolic and diastolic CHF (congestive heart failure) (HCC) 10/15/2022   Carotid stenosis, symptomatic w/o infarct, left 07/05/2022   Atrophy of kidney 09/27/2021   Atherosclerotic heart disease of native coronary artery with unspecified angina pectoris (HCC)  08/22/2021   Implantable cardioverter-defibrillator (ICD) in situ 08/22/2021   Hypertension 08/22/2021   Idiopathic gout, unspecified site 08/22/2021   Pure hypercholesterolemia, unspecified 08/22/2021   Aortic atherosclerosis (HCC) 05/08/2021   Type 2 diabetes mellitus with diabetic chronic kidney disease (HCC) 11/21/2020   Hypomagnesemia 04/26/2020   Numbness and tingling in both hands 04/19/2020   Atrial fibrillation (HCC) 04/19/2020   Other specified interstitial pulmonary diseases (HCC) 12/06/2019   Abdominal pain    Hyperuricemia 03/31/2019   Numbness 05/07/2018   Benign colonic polyp 03/24/2017   Tibialis anterior tenosynovitis 12/21/2015   Pleural nodule 07/27/2015   Restrictive lung disease 05/12/2015   Barrett esophagus 03/30/2015   Ache in joint 03/30/2015   Bradycardia 03/30/2015   CAD S/P percutaneous coronary angioplasty 03/30/2015   Cardiac defibrillator in place 03/30/2015   Chronic kidney disease, stage 4 (severe) (HCC) 03/30/2015   Cluster headache syndrome 03/30/2015   Degeneration of lumbar or lumbosacral intervertebral disc 03/30/2015   Gout 03/30/2015   Personal history of malignant neoplasm of bladder 03/30/2015   Hypercholesteremia 03/30/2015   Low back pain 03/30/2015   Personal history of malignant melanoma of skin 03/30/2015   Arthritis of knee, degenerative 03/30/2015   Obstructive sleep apnea 03/30/2015   Chronic venous insufficiency 03/30/2015   Psoriatic arthritis (HCC) 03/30/2015   History of abdominal aortic aneurysm (AAA) 06/24/2013   History of CVA (cerebrovascular accident) 06/24/2013   Cardiomyopathy, ischemic 06/24/2013   Essential (primary) hypertension 08/17/2011   Arrhythmia, sinus node 08/17/2011   History of ventricular fibrillation 08/17/2011   Systolic CHF with reduced left ventricular function, NYHA class 3 (HCC) 02/26/2010   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 04/27/2007   Cardiac pacemaker in situ 10/10/2006    PCP:  Gasper Nancyann BRAVO, MD Pharmacy:   [facility] [phone] - [place], [place] - [address] AT [facility] OF [address] [place] [address] [place] [address] Phone: [phone] Fax: [phone]     Social Drivers of Health (SDOH) Social History: SDOH Screenings   Food Insecurity: No Food Insecurity (10/22/2023)  Housing: Low Risk  (10/22/2023)  Transportation Needs: No Transportation Needs (10/22/2023)  Utilities: Not At Risk (05/12/2023)  Alcohol Screen: Low Risk  (05/12/2023)  Depression (PHQ2-9): Low Risk  (10/22/2023)  Recent Concern: Depression (PHQ2-9) - High Risk (10/06/2023)  Financial Resource Strain: Low Risk  (10/22/2023)  Physical Activity: Inactive (10/22/2023)  Social Connections: Socially Integrated (10/22/2023)  Stress: Stress Concern Present (05/12/2023)  Tobacco Use: Medium Risk (12/03/2023)  Health Literacy: Adequate Health Literacy (10/22/2023)   SDOH Interventions:     Readmission Risk Interventions    09/12/2023    3:46 PM  Readmission Risk Prevention Plan  Transportation Screening Complete  Medication Review (RN Care Manager) Complete  PCP or Specialist appointment within 3-5 days of discharge Complete  HRI or Home Care Consult Complete  SW Recovery Care/Counseling Consult Complete  Palliative Care Screening Not Applicable  Skilled Nursing Facility Complete

## 2023-12-05 NOTE — Progress Notes (Signed)
 Physical Therapy Treatment Patient Details Name: Taylor [name] MRN: [id number] DOB: 1940-11-29 Today's Date: 12/05/2023   History of Present Illness Taylor [name] is an 82yoM who comes to [facility] 12/03/23 after sudden onset weakness, dizziness, diplopia at home. Pt seen by neurology same day. PMH: CHF, A-fib on Eliquis , AICD in place, type II diabetic, hypertension, AAA, ascites from decompensated liver cirrhosis, COPD.    PT Comments  Pt still in ED on high gurney, agreeable to session. Obtaining a quality pleth remains challenging, limits ability to obtain an accurate saturation intermittently throughout session. Pt requires no physical assistance. Pt able to AMB 113ft twice, no limiting SOB, no dizziness, no LOB. Pt is on 6L and 3L respectively. RN made aware of updates.     If plan is discharge home, recommend the following: A little help with walking and/or transfers;Assistance with cooking/housework;Help with stairs or ramp for entrance;Assist for transportation;Direct supervision/assist for financial management   Can travel by private vehicle        Equipment Recommendations  None recommended by PT    Recommendations for Other Services       Precautions / Restrictions Precautions Precautions: Fall Restrictions Weight Bearing Restrictions Per Provider Order: No     Mobility  Bed Mobility Overal bed mobility: Needs Assistance Bed Mobility: Supine to Sit, Sit to Supine     Supine to sit: Supervision Sit to supine: Supervision   General bed mobility comments: belly distended and tight, ABD/hip flexion weakness    Transfers Overall transfer level: Needs assistance Equipment used: Rolling walker (2 wheels) Transfers: Sit to/from Stand Sit to Stand: Contact guard assist, Supervision                Ambulation/Gait   Gait Distance (Feet): 120 Feet (twice with a break between) Assistive device: Rolling walker (2 wheels)             Stairs              Wheelchair Mobility     Tilt Bed    Modified Rankin (Stroke Patients Only)       Balance                                            Cognition                                                Exercises      General Comments        Pertinent Vitals/Pain Pain Assessment Pain Assessment: No/denies pain    Home Living                          Prior Function            PT Goals (current goals can now be found in the care plan section) Acute Rehab PT Goals Patient Stated Goal: return to home, regain strength, be able to start cardiac rehab soon as scheduled PT Goal Formulation: With patient Time For Goal Achievement: 12/18/23 Potential to Achieve Goals: Fair Progress towards PT goals: Progressing toward goals    Frequency    Min 1X/week      PT Plan      Co-evaluation  AM-PAC PT 6 Clicks Mobility   Outcome Measure  Help needed turning from your back to your side while in a flat bed without using bedrails?: A Little Help needed moving from lying on your back to sitting on the side of a flat bed without using bedrails?: A Little Help needed moving to and from a bed to a chair (including a wheelchair)?: A Little Help needed standing up from a chair using your arms (e.g., wheelchair or bedside chair)?: A Little Help needed to walk in hospital room?: A Little Help needed climbing 3-5 steps with a railing? : A Little 6 Click Score: 18    End of Session Equipment Utilized During Treatment: Oxygen ;Gait belt Activity Tolerance: Patient tolerated treatment well;Patient limited by lethargy Patient left: in bed;with call bell/phone within reach Nurse Communication: Mobility status PT Visit Diagnosis: Unsteadiness on feet (R26.81);Other abnormalities of gait and mobility (R26.89)     Time: 0950-1006 PT Time Calculation (min) (ACUTE ONLY): 16 min  Charges:    $Therapeutic Activity: 8-22  mins PT General Charges $$ ACUTE PT VISIT: 1 Visit                    11:58 AM, 12/05/23 Taylor [name] Linear, PT, DPT Physical Therapist - [facility]  [phone] (ASCOM)    Taylor [name] 12/05/2023, 11:54 AM

## 2023-12-05 NOTE — Progress Notes (Signed)
 PROGRESS NOTE    Taylor [name]  FMW:[id number] DOB: 1941-10-03 DOA: 12/03/2023 PCP: [name], MD   Assessment & Plan:   Principal Problem:   Stroke-like symptom Active Problems:   CAD S/P percutaneous coronary angioplasty   History of CVA (cerebrovascular accident)   Systolic CHF with reduced left ventricular function, NYHA class 3 (HCC)   Obstructive sleep apnea   Atrial fibrillation (HCC)   Hyperbilirubinemia   Essential (primary) hypertension   Cardiac defibrillator in place   Restrictive lung disease   Other specified interstitial pulmonary diseases (HCC)   Implantable cardioverter-defibrillator (ICD) in situ   Weakness   Acute kidney injury superimposed on stage 4 chronic kidney disease (HCC)  Assessment and Plan: Stroke-like symptom: CT was neg for any acute intracranial findings. Continue on eliquis  as per neuro. Pt is no longer taking aspirin  as per med rec. PT recs HH. F/u outpatient w/ neuroopthalmology w/ Dr. Andrey Handler as per neuro. Neuro signed off 12/03/22   CAP: as per CXR. Continue on IV rocephin , azithromycin , bronchodilators & encourage incentive spirometry   CKDIV: Cr is labile. Avoid nephrotoxic meds    Hx of CVA: continue on statin, eliquis     Hx CAD: s/p CABG in 2007. Hx of AICD    Likely PAF: continue on eliquis     HTN: holding home torsemide  secondary to AKI. BP is WNL    HLD: continue on statin           DVT prophylaxis: eliquis   Code Status: full  Family Communication: discussed pt's care w/ pt's wife, Avelina, and answered her questions Disposition Plan: likely d/c home w/ HH    Level of care: Progressive Status is: Observation The patient remains OBS appropriate and will d/c before 2 midnights.    Consultants:  Neuro   Procedures:   Antimicrobials:   Subjective: Pt c/o fatigue   Objective: Vitals:   12/05/23 0430 12/05/23 0500 12/05/23 0530 12/05/23 0630  BP: (!) 136/100 (!) 132/99 (!) 132/107 (!) 120/93   Pulse: 78 (!) 55 72 86  Resp: 14 18 (!) 21 (!) 22  Temp:      TempSrc:      SpO2: 100% 98% 92% 96%  Weight:      Height:        Intake/Output Summary (Last 24 hours) at 12/05/2023 0841 Last data filed at 12/04/2023 1857 Gross per 24 hour  Intake 350 ml  Output 200 ml  Net 150 ml   Filed Weights   12/03/23 0753  Weight: 70.3 kg    Examination:  General exam: appears comfortable  Respiratory system: diminished breath sounds b/l  Cardiovascular system: S1/S2+. No rubs or clicks   Gastrointestinal system: Abd is soft, NT, ND & hypoactive bowel sounds  Central nervous system: alert & oriented.  Psychiatry: Judgement and insight appear normal. Flat mood and affect    Data Reviewed: I have personally reviewed following labs and imaging studies  CBC: Recent Labs  Lab 12/03/23 0802 12/04/23 0946 12/05/23 0325  WBC 6.0 5.5 6.0  HGB 11.9* 11.9* 11.8*  HCT 39.8 39.5 40.6  MCV 100.0 101.5* 103.3*  PLT 170 160 158   Basic Metabolic Panel: Recent Labs  Lab 12/03/23 0802 12/04/23 1205 12/05/23 0325  NA 137 135 137  K 5.3* 5.1 5.0  CL 101 101 103  CO2 24 26 23   GLUCOSE 117* 124* 94  BUN 43* 48* 50*  CREATININE 3.05* 3.30* 3.34*  CALCIUM  9.0 8.9 8.7*   GFR:  Estimated Creatinine Clearance: 17.2 mL/min (A) (by C-G formula based on SCr of 3.34 mg/dL (H)). Liver Function Tests: Recent Labs  Lab 12/03/23 0802 12/04/23 1205  AST 31 21  ALT 8 10  ALKPHOS 150* 120  BILITOT 1.9* 1.6*  PROT 7.8 7.3  ALBUMIN  2.6* 2.5*   No results for input(s): LIPASE, AMYLASE in the last 168 hours. No results for input(s): AMMONIA in the last 168 hours. Coagulation Profile: No results for input(s): INR, PROTIME in the last 168 hours. Cardiac Enzymes: No results for input(s): CKTOTAL, CKMB, CKMBINDEX, TROPONINI in the last 168 hours. BNP (last 3 results) No results for input(s): PROBNP in the last 8760 hours. HbA1C: No results for input(s): HGBA1C in the  last 72 hours. CBG: Recent Labs  Lab 12/03/23 0807  GLUCAP 102*   Lipid Profile: Recent Labs    12/04/23 0340  CHOL 53  HDL 20*  LDLCALC 20  TRIG 63  CHOLHDL 2.7   Thyroid  Function Tests: No results for input(s): TSH, T4TOTAL, FREET4, T3FREE, THYROIDAB in the last 72 hours. Anemia Panel: No results for input(s): VITAMINB12, FOLATE, FERRITIN, TIBC, IRON, RETICCTPCT in the last 72 hours. Sepsis Labs: Recent Labs  Lab 12/03/23 0957  PROCALCITON 0.19    Recent Results (from the past 240 hours)  Resp panel by RT-PCR (RSV, Flu A&B, Covid) Anterior Nasal Swab     Status: None   Collection Time: 12/03/23 11:19 AM   Specimen: Anterior Nasal Swab  Result Value Ref Range Status   SARS Coronavirus 2 by RT PCR NEGATIVE NEGATIVE Final    Comment: (NOTE) SARS-CoV-2 target nucleic acids are NOT DETECTED.  The SARS-CoV-2 RNA is generally detectable in upper respiratory specimens during the acute phase of infection. The lowest concentration of SARS-CoV-2 viral copies this assay can detect is 138 copies/mL. A negative result does not preclude SARS-Cov-2 infection and should not be used as the sole basis for treatment or other patient management decisions. A negative result may occur with  improper specimen collection/handling, submission of specimen other than nasopharyngeal swab, presence of viral mutation(s) within the areas targeted by this assay, and inadequate number of viral copies(<138 copies/mL). A negative result must be combined with clinical observations, patient history, and epidemiological information. The expected result is Negative.  Fact Sheet for Patients:  bloggercourse.com  Fact Sheet for Healthcare Providers:  seriousbroker.it  This test is no t yet approved or cleared by the [place]  FDA and  has been authorized for detection and/or diagnosis of SARS-CoV-2 by FDA under an Emergency  Use Authorization (EUA). This EUA will remain  in effect (meaning this test can be used) for the duration of the COVID-19 declaration under Section 564(b)(1) of the Act, 21 U.S.C.section 360bbb-3(b)(1), unless the authorization is terminated  or revoked sooner.       Influenza A by PCR NEGATIVE NEGATIVE Final   Influenza B by PCR NEGATIVE NEGATIVE Final    Comment: (NOTE) The Xpert Xpress SARS-CoV-2/FLU/RSV plus assay is intended as an aid in the diagnosis of influenza from Nasopharyngeal swab specimens and should not be used as a sole basis for treatment. Nasal washings and aspirates are unacceptable for Xpert Xpress SARS-CoV-2/FLU/RSV testing.  Fact Sheet for Patients: bloggercourse.com  Fact Sheet for Healthcare Providers: seriousbroker.it  This test is not yet approved or cleared by the [place]  FDA and has been authorized for detection and/or diagnosis of SARS-CoV-2 by FDA under an Emergency Use Authorization (EUA). This EUA will remain in effect (meaning this  test can be used) for the duration of the COVID-19 declaration under Section 564(b)(1) of the Act, 21 U.S.C. section 360bbb-3(b)(1), unless the authorization is terminated or revoked.     Resp Syncytial Virus by PCR NEGATIVE NEGATIVE Final    Comment: (NOTE) Fact Sheet for Patients: bloggercourse.com  Fact Sheet for Healthcare Providers: seriousbroker.it  This test is not yet approved or cleared by the [place]  FDA and has been authorized for detection and/or diagnosis of SARS-CoV-2 by FDA under an Emergency Use Authorization (EUA). This EUA will remain in effect (meaning this test can be used) for the duration of the COVID-19 declaration under Section 564(b)(1) of the Act, 21 U.S.C. section 360bbb-3(b)(1), unless the authorization is terminated or revoked.  Performed at [facility], [address] [place], [place] [address]          Radiology Studies: US  Carotid Bilateral Result Date: 12/03/2023 CLINICAL DATA:  Weakness and stroke-like symptoms, initial encounter EXAM: BILATERAL CAROTID DUPLEX ULTRASOUND TECHNIQUE: Elnor scale imaging, color Doppler and duplex ultrasound were performed of bilateral carotid and vertebral arteries in the neck. COMPARISON:  04/24/2022 FINDINGS: Criteria: Quantification of carotid stenosis is based on velocity parameters that correlate the residual internal carotid diameter with NASCET-based stenosis levels, using the diameter of the distal internal carotid lumen as the denominator for stenosis measurement. The following velocity measurements were obtained: RIGHT ICA: 36/20 cm/sec CCA: 37/10 cm/sec SYSTOLIC ICA/CCA RATIO:  1.0 ECA: 18 cm/sec LEFT ICA: 43/19 cm/sec CCA: 36/13 cm/sec SYSTOLIC ICA/CCA RATIO:  1.2 ECA: 42 cm/sec RIGHT CAROTID ARTERY: Preliminary grayscale images demonstrate scattered atherosclerotic plaque. The waveforms, velocities and flow velocity ratios however show no evidence of focal hemodynamically significant stenosis. RIGHT VERTEBRAL ARTERY:  Antegrade in nature. LEFT CAROTID ARTERY: Preliminary grayscale images demonstrate scattered common carotid plaque. A stent is noted extending from the distal common carotid artery into the proximal internal carotid artery consistent with the given clinical history. No significant in stent stenosis is noted. LEFT VERTEBRAL ARTERY:  Antegrade in nature. IMPRESSION: No evidence of focal significant carotid stenosis. Changes consistent with prior left carotid stenting. Electronically Signed   By: Oneil Devonshire M.D.   On: 12/03/2023 17:34        Scheduled Meds:   stroke: early stages of recovery book   Does not apply Once   apixaban   2.5 mg Oral BID   atorvastatin   40 mg Oral Daily   guaiFENesin   600 mg Oral BID   magnesium  oxide  200 mg Oral BID   pantoprazole   40 mg Oral Daily    Continuous Infusions:  azithromycin  Stopped (12/04/23 1857)   cefTRIAXone  (ROCEPHIN )  IV Stopped (12/04/23 1716)     LOS: 0 days        Anthony CHRISTELLA Pouch, MD [facility] Pager [phone]  If 7PM-7AM, please contact night-coverage [email or web address] 12/05/2023, 8:41 AM

## 2023-12-06 DIAGNOSIS — J189 Pneumonia, unspecified organism: Secondary | ICD-10-CM | POA: Diagnosis not present

## 2023-12-06 LAB — BASIC METABOLIC PANEL
Anion gap: 10 (ref 5–15)
BUN: 60 mg/dL — ABNORMAL HIGH (ref 8–23)
CO2: 24 mmol/L (ref 22–32)
Calcium: 8.9 mg/dL (ref 8.9–10.3)
Chloride: 99 mmol/L (ref 98–111)
Creatinine, Ser: 3.3 mg/dL — ABNORMAL HIGH (ref 0.61–1.24)
GFR, Estimated: 18 mL/min — ABNORMAL LOW (ref >60)
Glucose, Bld: 91 mg/dL (ref 70–99)
Potassium: 4.7 mmol/L (ref 3.5–5.1)
Sodium: 133 mmol/L — ABNORMAL LOW (ref 135–145)

## 2023-12-06 LAB — CBC
HCT: 40.5 % (ref 39.0–52.0)
Hemoglobin: 12.3 g/dL — ABNORMAL LOW (ref 13.0–17.0)
MCH: 30.1 pg (ref 26.0–34.0)
MCHC: 30.4 g/dL (ref 30.0–36.0)
MCV: 99 fL (ref 80.0–100.0)
Platelets: 161 10*3/uL (ref 150–400)
RBC: 4.09 MIL/uL — ABNORMAL LOW (ref 4.22–5.81)
RDW: 19.9 % — ABNORMAL HIGH (ref 11.5–15.5)
WBC: 5.4 10*3/uL (ref 4.0–10.5)
nRBC: 0 % (ref 0.0–0.2)

## 2023-12-06 MED ORDER — AZITHROMYCIN 250 MG PO TABS
500.0000 mg | ORAL_TABLET | Freq: Every day | ORAL | Status: DC
  Administered 2023-12-06 – 2023-12-07 (×2): 500 mg via ORAL
  Filled 2023-12-06 (×2): qty 2

## 2023-12-06 MED ORDER — GUAIFENESIN 100 MG/5ML PO LIQD: 5.0000 mL | ORAL | Status: DC | PRN

## 2023-12-06 NOTE — Plan of Care (Signed)

## 2023-12-06 NOTE — Plan of Care (Signed)
  Problem: Nutrition: Goal: Risk of aspiration will decrease Outcome: Progressing   Problem: Clinical Measurements: Goal: Respiratory complications will improve Outcome: Progressing   Problem: Nutrition: Goal: Adequate nutrition will be maintained Outcome: Progressing   Problem: Safety: Goal: Ability to remain free from injury will improve Outcome: Progressing   Problem: Skin Integrity: Goal: Risk for impaired skin integrity will decrease Outcome: Progressing

## 2023-12-06 NOTE — Progress Notes (Addendum)
 PROGRESS NOTE    Taylor [name]  FMW:[id number] DOB: [birth year] DOA: 12/03/2023 PCP: [name], MD   Assessment & Plan:   Principal Problem:   Stroke-like symptom Active Problems:   CAD S/P percutaneous coronary angioplasty   History of CVA (cerebrovascular accident)   Systolic CHF with reduced left ventricular function, NYHA class 3 (HCC)   Obstructive sleep apnea   Atrial fibrillation (HCC)   Hyperbilirubinemia   Essential (primary) hypertension   Cardiac defibrillator in place   Restrictive lung disease   Other specified interstitial pulmonary diseases (HCC)   Implantable cardioverter-defibrillator (ICD) in situ   Weakness   Acute kidney injury superimposed on stage 4 chronic kidney disease (HCC)  Assessment and Plan: Stroke-like symptom: CT was neg for any acute intracranial findings. Continue on eliquis  as per neuro. Pt is no longer taking aspirin  as per med rec. PT recs HH. F/u outpatient w/ neuroopthalmology w/ Dr. Andrey Handler as per neuro. Neuro signed off 12/03/22   CAP: as per CXR. Continue on IV rocephin , azithromycin , bronchodilators & encourage incentive spirometry  Acute on chronic hypoxic respiratory failure: continue on supplemental oxygen  and wean back to baseline as tolerated. Uses 2.5L [place] chronically at home   Continue on IV rocephin , azithromycin , bronchodilators & encourage incentive spirometry   CKDIV: Cr is trending from day prior. Avoid nephrotoxic meds    Hx of CVA: continue on eliquis , statin    Hx CAD: s/p CABG in 2007. Hx of AICD     Likely PAF: continue on eliquis     HTN: holding home torsemide  secondary to AKI. BP is WNL    HLD: continue on statin           DVT prophylaxis: eliquis   Code Status: full  Family Communication: discussed pt's care w/ pt's wife, Avelina, and answered her questions Disposition Plan: likely d/c home w/ HH    Level of care: Progressive Status is: Observation The patient remains OBS appropriate and  will d/c before 2 midnights.    Consultants:  Neuro   Procedures:   Antimicrobials:   Subjective: Pt c/o fatigue   Objective: Vitals:   12/05/23 2000 12/06/23 0000 12/06/23 0400 12/06/23 0740  BP: 122/78 115/84 126/89 (!) 126/94  Pulse: 79 82 82 84  Resp: 19 20 18 18   Temp: 98 F (36.7 C) 98.2 F (36.8 C) 97.8 F (36.6 C) 97.8 F (36.6 C)  TempSrc: Oral Oral Oral Oral  SpO2: 95% 96% 96% 97%  Weight:      Height:        Intake/Output Summary (Last 24 hours) at 12/06/2023 0951 Last data filed at 12/06/2023 0400 Gross per 24 hour  Intake 240 ml  Output 300 ml  Net -60 ml   Filed Weights   12/03/23 0753 12/05/23 1444  Weight: 70.3 kg 76.9 kg    Examination:  General exam: appears comfortable   Respiratory system: course breath sounds b/l  Cardiovascular system: S1 & S2+. No rubs or clicks  Gastrointestinal system: abd is soft, NT, ND & normal bowel sounds Central nervous system: alert & awake. Moves all extremities  Psychiatry: Judgement and insight appears poor. Flat mood and affect    Data Reviewed: I have personally reviewed following labs and imaging studies  CBC: Recent Labs  Lab 12/03/23 0802 12/04/23 0946 12/05/23 0325 12/06/23 0440  WBC 6.0 5.5 6.0 5.4  HGB 11.9* 11.9* 11.8* 12.3*  HCT 39.8 39.5 40.6 40.5  MCV 100.0 101.5* 103.3* 99.0  PLT 170  160 158 161   Basic Metabolic Panel: Recent Labs  Lab 12/03/23 0802 12/04/23 1205 12/05/23 0325 12/06/23 0440  NA 137 135 137 133*  K 5.3* 5.1 5.0 4.7  CL 101 101 103 99  CO2 24 26 23 24   GLUCOSE 117* 124* 94 91  BUN 43* 48* 50* 60*  CREATININE 3.05* 3.30* 3.34* 3.30*  CALCIUM  9.0 8.9 8.7* 8.9   GFR: Estimated Creatinine Clearance: 18.1 mL/min (A) (by C-G formula based on SCr of 3.3 mg/dL (H)). Liver Function Tests: Recent Labs  Lab 12/03/23 0802 12/04/23 1205  AST 31 21  ALT 8 10  ALKPHOS 150* 120  BILITOT 1.9* 1.6*  PROT 7.8 7.3  ALBUMIN  2.6* 2.5*   No results for input(s):  LIPASE, AMYLASE in the last 168 hours. No results for input(s): AMMONIA in the last 168 hours. Coagulation Profile: No results for input(s): INR, PROTIME in the last 168 hours. Cardiac Enzymes: No results for input(s): CKTOTAL, CKMB, CKMBINDEX, TROPONINI in the last 168 hours. BNP (last 3 results) No results for input(s): PROBNP in the last 8760 hours. HbA1C: No results for input(s): HGBA1C in the last 72 hours. CBG: Recent Labs  Lab 12/03/23 0807  GLUCAP 102*   Lipid Profile: Recent Labs    12/04/23 0340  CHOL 53  HDL 20*  LDLCALC 20  TRIG 63  CHOLHDL 2.7   Thyroid  Function Tests: No results for input(s): TSH, T4TOTAL, FREET4, T3FREE, THYROIDAB in the last 72 hours. Anemia Panel: No results for input(s): VITAMINB12, FOLATE, FERRITIN, TIBC, IRON, RETICCTPCT in the last 72 hours. Sepsis Labs: Recent Labs  Lab 12/03/23 0957  PROCALCITON 0.19    Recent Results (from the past 240 hours)  Resp panel by RT-PCR (RSV, Flu A&B, Covid) Anterior Nasal Swab     Status: None   Collection Time: 12/03/23 11:19 AM   Specimen: Anterior Nasal Swab  Result Value Ref Range Status   SARS Coronavirus 2 by RT PCR NEGATIVE NEGATIVE Final    Comment: (NOTE) SARS-CoV-2 target nucleic acids are NOT DETECTED.  The SARS-CoV-2 RNA is generally detectable in upper respiratory specimens during the acute phase of infection. The lowest concentration of SARS-CoV-2 viral copies this assay can detect is 138 copies/mL. A negative result does not preclude SARS-Cov-2 infection and should not be used as the sole basis for treatment or other patient management decisions. A negative result may occur with  improper specimen collection/handling, submission of specimen other than nasopharyngeal swab, presence of viral mutation(s) within the areas targeted by this assay, and inadequate number of viral copies(<138 copies/mL). A negative result must be combined  with clinical observations, patient history, and epidemiological information. The expected result is Negative.  Fact Sheet for Patients:  bloggercourse.com  Fact Sheet for Healthcare Providers:  seriousbroker.it  This test is no t yet approved or cleared by the [place]  FDA and  has been authorized for detection and/or diagnosis of SARS-CoV-2 by FDA under an Emergency Use Authorization (EUA). This EUA will remain  in effect (meaning this test can be used) for the duration of the COVID-19 declaration under Section 564(b)(1) of the Act, 21 U.S.C.section 360bbb-3(b)(1), unless the authorization is terminated  or revoked sooner.       Influenza A by PCR NEGATIVE NEGATIVE Final   Influenza B by PCR NEGATIVE NEGATIVE Final    Comment: (NOTE) The Xpert Xpress SARS-CoV-2/FLU/RSV plus assay is intended as an aid in the diagnosis of influenza from Nasopharyngeal swab specimens and should not be  used as a sole basis for treatment. Nasal washings and aspirates are unacceptable for Xpert Xpress SARS-CoV-2/FLU/RSV testing.  Fact Sheet for Patients: bloggercourse.com  Fact Sheet for Healthcare Providers: seriousbroker.it  This test is not yet approved or cleared by the [place]  FDA and has been authorized for detection and/or diagnosis of SARS-CoV-2 by FDA under an Emergency Use Authorization (EUA). This EUA will remain in effect (meaning this test can be used) for the duration of the COVID-19 declaration under Section 564(b)(1) of the Act, 21 U.S.C. section 360bbb-3(b)(1), unless the authorization is terminated or revoked.     Resp Syncytial Virus by PCR NEGATIVE NEGATIVE Final    Comment: (NOTE) Fact Sheet for Patients: bloggercourse.com  Fact Sheet for Healthcare Providers: seriousbroker.it  This test is not yet approved  or cleared by the [place]  FDA and has been authorized for detection and/or diagnosis of SARS-CoV-2 by FDA under an Emergency Use Authorization (EUA). This EUA will remain in effect (meaning this test can be used) for the duration of the COVID-19 declaration under Section 564(b)(1) of the Act, 21 U.S.C. section 360bbb-3(b)(1), unless the authorization is terminated or revoked.  Performed at [facility], [address] [place], [place] [address]          Radiology Studies: No results found.       Scheduled Meds:  apixaban   2.5 mg Oral BID   atorvastatin   40 mg Oral Daily   guaiFENesin   600 mg Oral BID   magnesium  oxide  200 mg Oral BID   pantoprazole   40 mg Oral Daily   Continuous Infusions:  azithromycin  500 mg (12/05/23 1559)   cefTRIAXone  (ROCEPHIN )  IV 1 g (12/05/23 1517)     LOS: 1 day        Anthony CHRISTELLA Pouch, MD [facility] Pager [phone]  If 7PM-7AM, please contact night-coverage [email or web address] 12/06/2023, 9:51 AM

## 2023-12-07 DIAGNOSIS — J189 Pneumonia, unspecified organism: Secondary | ICD-10-CM | POA: Diagnosis not present

## 2023-12-07 LAB — BASIC METABOLIC PANEL
Anion gap: 9 (ref 5–15)
BUN: 62 mg/dL — ABNORMAL HIGH (ref 8–23)
CO2: 22 mmol/L (ref 22–32)
Calcium: 8.6 mg/dL — ABNORMAL LOW (ref 8.9–10.3)
Chloride: 100 mmol/L (ref 98–111)
Creatinine, Ser: 3.01 mg/dL — ABNORMAL HIGH (ref 0.61–1.24)
GFR, Estimated: 20 mL/min — ABNORMAL LOW (ref >60)
Glucose, Bld: 98 mg/dL (ref 70–99)
Potassium: 4.6 mmol/L (ref 3.5–5.1)
Sodium: 131 mmol/L — ABNORMAL LOW (ref 135–145)

## 2023-12-07 LAB — CBC
HCT: 37.8 % — ABNORMAL LOW (ref 39.0–52.0)
Hemoglobin: 11.6 g/dL — ABNORMAL LOW (ref 13.0–17.0)
MCH: 29.6 pg (ref 26.0–34.0)
MCHC: 30.7 g/dL (ref 30.0–36.0)
MCV: 96.4 fL (ref 80.0–100.0)
Platelets: 156 10*3/uL (ref 150–400)
RBC: 3.92 MIL/uL — ABNORMAL LOW (ref 4.22–5.81)
RDW: 19.9 % — ABNORMAL HIGH (ref 11.5–15.5)
WBC: 5.6 10*3/uL (ref 4.0–10.5)
nRBC: 0 % (ref 0.0–0.2)

## 2023-12-07 LAB — MRSA NEXT GEN BY PCR, NASAL: MRSA by PCR Next Gen: DETECTED — AB

## 2023-12-07 MED ORDER — CHLORHEXIDINE GLUCONATE CLOTH 2 % EX PADS
6.0000 | MEDICATED_PAD | Freq: Every day | CUTANEOUS | Status: DC
  Administered 2023-12-07: 6 via TOPICAL

## 2023-12-07 MED ORDER — MUPIROCIN 2 % EX OINT
1.0000 | TOPICAL_OINTMENT | Freq: Two times a day (BID) | CUTANEOUS | Status: DC
  Administered 2023-12-07: 1 via NASAL
  Filled 2023-12-07: qty 22

## 2023-12-07 MED ORDER — PREDNISONE 20 MG PO TABS: 40.0000 mg | ORAL_TABLET | Freq: Every day | ORAL | 0 refills | Status: AC

## 2023-12-07 MED ORDER — DEXTROMETHORPHAN HBR 15 MG/5ML PO SYRP: 5.0000 mL | ORAL_SOLUTION | Freq: Four times a day (QID) | ORAL | 0 refills | Status: DC | PRN

## 2023-12-07 MED ORDER — AZITHROMYCIN 500 MG PO TABS: 500.0000 mg | ORAL_TABLET | Freq: Every day | ORAL | 0 refills | Status: AC

## 2023-12-07 NOTE — Progress Notes (Signed)
 Lab called, patient positive for MRSA (nasal swab)

## 2023-12-07 NOTE — TOC Transition Note (Signed)
 Transition of Care [facility]) - Discharge Note   Patient Details  Name: Taylor [name] MRN: [id number] Date of Birth: 1941-08-16  Transition of Care [facility] CM/SW Contact:  Heron KATHEE Edison, RN Phone Number: 12/07/2023, 1:35 PM   Clinical Narrative:  12/07/23: Patient has discharge orders in for today. Patient declined HH and opted for OP PT and OP cardiac rehab of which he was engaged with pta. Order in and signed for OP therapy.  Please contact TOC RN CM today at [phone] if further/additional needs arise prior to discharge. Thank you.   Bing Edison MSN RN CM  Care Management Department.  [place]  [facility] Direct Dial: [phone] (Weekends Only) [facility] Main Office Phone: [phone] [facility] Fax: [phone]      Final next level of care: OP Rehab Barriers to Discharge: No Barriers Identified   Patient Goals and CMS Choice Patient states their goals for this hospitalization and ongoing recovery are:: declines HH CMS Medicare.gov Compare Post Acute Care list provided to:: Patient Choice offered to / list presented to : Patient      Discharge Placement                       Discharge Plan and Services Additional resources added to the After Visit Summary for                  DME Arranged: N/A DME Agency: NA       HH Arranged: Patient Refused HH (Reqeusted OP PT)          Social Drivers of Health (SDOH) Interventions SDOH Screenings   Food Insecurity: No Food Insecurity (12/05/2023)  Housing: Low Risk  (12/05/2023)  Transportation Needs: No Transportation Needs (12/05/2023)  Utilities: Not At Risk (12/05/2023)  Alcohol Screen: Low Risk  (05/12/2023)  Depression (PHQ2-9): Low Risk  (10/22/2023)  Recent Concern: Depression (PHQ2-9) - High Risk (10/06/2023)  Financial Resource Strain: Low Risk  (10/22/2023)  Physical Activity: Inactive (10/22/2023)  Social Connections: Socially Integrated (12/05/2023)  Stress: Stress Concern Present (05/12/2023)  Tobacco  Use: Medium Risk (12/03/2023)  Health Literacy: Adequate Health Literacy (10/22/2023)     Readmission Risk Interventions    09/12/2023    3:46 PM  Readmission Risk Prevention Plan  Transportation Screening Complete  Medication Review (RN Care Manager) Complete  PCP or Specialist appointment within 3-5 days of discharge Complete  HRI or Home Care Consult Complete  SW Recovery Care/Counseling Consult Complete  Palliative Care Screening Not Applicable  Skilled Nursing Facility Complete

## 2023-12-07 NOTE — Discharge Summary (Signed)
 Physician Discharge Summary  [name]:[id number] DOB: 09-01-1941 DOA: 12/03/2023  PCP: Taylor Nancyann FORBES, MD  Admit date: 12/03/2023 Discharge date: 12/07/2023  Admitted From: home  Disposition:  home   Recommendations for Outpatient Follow-up:  Follow up with PCP in 1-2 weeks F/u w/ neurophthalmology w/in 1 week, Dr. Waylon   Home Health: pt refused HH  Equipment/Devices: uses 2.5L [place] chronically   Discharge Condition: stable  CODE STATUS: full  Diet recommendation: Heart Healthy / Carb Modified  Brief/Interim Summary: HPI was taken from Dr. Sherre: Taylor [name] is an 83 year old male with history of left parietal skull fracture, history of left occipital infarct, CKD stage IV, non-insulin -dependent diabetes mellitus, hyperlipidemia, hypertension, GERD, who presents to the emergency department for chief concerns of weakness, dizziness, double vision.   Vitals in the ED showed temperature of 97.6, respiration rate 30, heart rate of 85, blood pressure 130/79, SpO2 of 98% on room air.   Serum sodium is 137, potassium 5.3, chloride 101, bicarb 24, BUN of 40, serum creatinine 3.05, EGFR of 20, nonfasting blood glucose 117, WBC 6.0, hemoglobin 11.9, platelets of 170.   BNP is elevated 1056.2.  T. bili elevated at 1.9.  High sensitive troponin was 24 on repeat was 26.  Alk phos was 150.  Albumin  level was 2.6.  COVID/influenza A/influenza B/RSV PCR were negative.   UA was negative for leukocytes and nitrates.   Chest x-ray: Was read as right bibasilar opacity, cardiac device, dual leads in place.     ED treatment: Furosemide  40 mg IV one-time dose, sodium chloride  500 mL liter bolus. -------------------------------- At bedside patient name, age, location, current year.  He was able to identify his spouse bedside   He reports that he was sitting at home watching TV when he developed double vision and diaphoresis.  It lasted approximately 10 minutes and has resolved.  He reports that  his tongue hurts, and it feels like it is split. He is not aware of passing out. Spouse is not aware of any passing out, syncope, lost of consciousness, or seizure.   He denies chest pain, nausea, vomiting, shortness of breath, abdominal pain, dysuria, hematuria, diarrhea.  He reports normal bowel movements prior to ED presentation.  He does not know what color it was because he did not look.  Discharge Diagnoses:  Principal Problem:   Stroke-like symptom Active Problems:   CAD S/P percutaneous coronary angioplasty   History of CVA (cerebrovascular accident)   Systolic CHF with reduced left ventricular function, NYHA class 3 (HCC)   Obstructive sleep apnea   Atrial fibrillation (HCC)   Hyperbilirubinemia   Essential (primary) hypertension   Cardiac defibrillator in place   Restrictive lung disease   Other specified interstitial pulmonary diseases (HCC)   Implantable cardioverter-defibrillator (ICD) in situ   Weakness   Acute kidney injury superimposed on stage 4 chronic kidney disease (HCC)  Stroke-like symptom: CT was neg for any acute intracranial findings. Continue on eliquis  as per neuro. Pt is no longer taking aspirin  as per med rec. PT recs HH but pt refuses HH. F/u outpatient w/ neuroopthalmology w/ Dr. Andrey Waylon as per neuro. Neuro signed off 12/03/22    CAP: as per CXR. Continue on IV rocephin , azithromycin , bronchodilators & encourage incentive spirometry. D/c w/ azithromycin  to complete course and steroids x 5 days. Robitussin prn cough    Acute on chronic hypoxic respiratory failure: continue on supplemental oxygen  and weaned back to baseline. Uses 2.5L  chronically at  home    CKDIV: Cr continues to trend down. Avoid nephrotoxic meds   DM2: HbA1c 6.6. Continue on home dose of jardiance    Hx of CVA: continue on eliquis , statin    Hx CAD: s/p CABG in 2007. Hx of AICD     Likely PAF: continue on eliquis     HTN: holding home torsemide  secondary to AKI. BP is WNL     HLD: continue on statin   Discharge Instructions  Discharge Instructions     Diet - low sodium heart healthy   Complete by: As directed    Diet Carb Modified   Complete by: As directed    Discharge instructions   Complete by: As directed    F/u w/ PCP in 1-2 weeks. Recommend outpatient Neuroophthalmology evaluation at [facility] with Dr. Andrey Handler.   Increase activity slowly   Complete by: As directed       Allergies as of 12/07/2023       Reactions   Amlodipine Besylate Swelling   Had a reaction when taking with colcrys     Crestor [rosuvastatin]    Muscle cramps and pain. Tolerates atorvastatin         Medication List     STOP taking these medications    metoprolol  succinate 25 MG 24 hr tablet Commonly known as: TOPROL -XL       TAKE these medications    acetaminophen  500 MG tablet Commonly known as: TYLENOL  Take 500 mg by mouth every 6 (six) hours as needed for mild pain.   albuterol  108 (90 Base) MCG/ACT inhaler Commonly known as: VENTOLIN  HFA Inhale 2 puffs into the lungs every 6 (six) hours as needed for wheezing or shortness of breath.   apixaban  2.5 MG Tabs tablet Commonly known as: ELIQUIS  Take 1 tablet (2.5 mg total) by mouth 2 (two) times daily.   aspirin  EC 81 MG tablet Take 81 mg by mouth daily.   atorvastatin  40 MG tablet Commonly known as: LIPITOR TAKE 1 TABLET(40 MG) BY MOUTH DAILY   azithromycin  500 MG tablet Commonly known as: ZITHROMAX  Take 1 tablet (500 mg total) by mouth daily for 2 days.   clobetasol  ointment 0.05 % Commonly known as: TEMOVATE  Apply 1 Application topically 2 (two) times daily.   dextromethorphan  15 MG/5ML syrup Take 5 mLs (15 mg total) by mouth 4 (four) times daily as needed for up to 14 days for cough.   empagliflozin  10 MG Tabs tablet Commonly known as: JARDIANCE  Take 1 tablet by mouth daily.   fluticasone  50 MCG/ACT nasal spray Commonly known as: FLONASE  Place 2 sprays into  both nostrils daily.   ipratropium-albuterol  0.5-2.5 (3) MG/3ML Soln Commonly known as: DUONEB Inhale 3 mLs into the lungs every 4 (four) hours as needed (wheezing, shob).   isosorbide  mononitrate 30 MG 24 hr tablet Commonly known as: IMDUR  Take 0.5 tablets (15 mg total) by mouth daily.   Magnesium  250 MG Tabs Take 125 mg by mouth 2 (two) times daily.   MUCINEX  PO Take by mouth.   pantoprazole  40 MG tablet Commonly known as: PROTONIX  TAKE 1 TABLET BY MOUTH EVERY DAY   potassium chloride  SA 20 MEQ tablet Commonly known as: KLOR-CON  M Take 10 mEq by mouth 2 (two) times daily.   predniSONE  20 MG tablet Commonly known as: DELTASONE  Take 2 tablets (40 mg total) by mouth daily with breakfast for 5 days.   torsemide  20 MG tablet Commonly known as: DEMADEX  Take 1 tablet (20 mg total) by mouth  daily. What changed: when to take this   triamcinolone  ointment 0.1 % Commonly known as: KENALOG  Apply topically 2 (two) times daily. APPLY TOPICALLY TWICE DAILY AS DIRECTED        Follow-up Information     Waylon Andrey PARAS, MD Follow up.   Specialty: Ophthalmology Why: F/u within 1 week Contact information: [address] [place] [address] [phone]         Taylor Nancyann BRAVO, MD Follow up.   Specialty: Family Medicine Why: F/u in 1-2 weeks Contact information: [address] [place] [address] [phone]                Allergies  Allergen Reactions   Amlodipine Besylate Swelling    Had a reaction when taking with colcrys     Crestor [Rosuvastatin]     Muscle cramps and pain. Tolerates atorvastatin     Consultations:   Procedures/Studies: US  Carotid Bilateral Result Date: 12/03/2023 CLINICAL DATA:  Weakness and stroke-like symptoms, initial encounter EXAM: BILATERAL CAROTID DUPLEX ULTRASOUND TECHNIQUE: Elnor scale imaging, color Doppler and duplex ultrasound were performed of bilateral carotid and vertebral arteries in the neck. COMPARISON:   04/24/2022 FINDINGS: Criteria: Quantification of carotid stenosis is based on velocity parameters that correlate the residual internal carotid diameter with NASCET-based stenosis levels, using the diameter of the distal internal carotid lumen as the denominator for stenosis measurement. The following velocity measurements were obtained: RIGHT ICA: 36/20 cm/sec CCA: 37/10 cm/sec SYSTOLIC ICA/CCA RATIO:  1.0 ECA: 18 cm/sec LEFT ICA: 43/19 cm/sec CCA: 36/13 cm/sec SYSTOLIC ICA/CCA RATIO:  1.2 ECA: 42 cm/sec RIGHT CAROTID ARTERY: Preliminary grayscale images demonstrate scattered atherosclerotic plaque. The waveforms, velocities and flow velocity ratios however show no evidence of focal hemodynamically significant stenosis. RIGHT VERTEBRAL ARTERY:  Antegrade in nature. LEFT CAROTID ARTERY: Preliminary grayscale images demonstrate scattered common carotid plaque. A stent is noted extending from the distal common carotid artery into the proximal internal carotid artery consistent with the given clinical history. No significant in stent stenosis is noted. LEFT VERTEBRAL ARTERY:  Antegrade in nature. IMPRESSION: No evidence of focal significant carotid stenosis. Changes consistent with prior left carotid stenting. Electronically Signed   By: Oneil Devonshire M.D.   On: 12/03/2023 17:34   DG Chest 2 View Result Date: 12/03/2023 CLINICAL DATA:  Shortness of breath. EXAM: CHEST - 2 VIEW COMPARISON:  09/10/2023. FINDINGS: There is mild-to-moderate diffuse pulmonary vascular congestion. There is right basilar opacity likely combination of right lung atelectasis and/or consolidation with small right pleural effusion. No significant left pleural effusion. No pneumothorax on either side. Stable moderately enlarged cardio-mediastinal silhouette. There is a left sided 3-lead pacemaker. There are surgical staples along the heart border and sternotomy wires, status post CABG (coronary artery bypass graft). No acute osseous  abnormalities. The soft tissues are within normal limits. IMPRESSION: *Findings favor congestive heart failure/pulmonary edema. *Right basilar opacity, as described above. Electronically Signed   By: Ree Molt M.D.   On: 12/03/2023 08:45   CT HEAD WO CONTRAST ( ) Result Date: 12/03/2023 CLINICAL DATA:  Weakness.  Double vision.  Nausea.  Diaphoresis. EXAM: CT HEAD WITHOUT CONTRAST TECHNIQUE: Contiguous axial images were obtained from the base of the skull through the vertex without intravenous contrast. RADIATION DOSE REDUCTION: This exam was performed according to the departmental dose-optimization program which includes automated exposure control, adjustment of the mA and/or kV according to patient size and/or use of iterative reconstruction technique. COMPARISON:  09/10/2023 FINDINGS: Brain: No abnormality is seen affecting  the brainstem or cerebellum. Cerebral hemispheres show age related volume loss with chronic small-vessel ischemic changes of the white matter and an old left occipital infarction. No sign of acute infarction, mass lesion, hemorrhage, hydrocephalus or extra-axial collection. Vascular: There is atherosclerotic calcification of the major vessels at the base of the brain. Skull: Old healed left parietal skull fracture. Sinuses/Orbits: Mild mucosal thickening of the paranasal sinuses, improved since the study of October. Orbits negative. Other: None IMPRESSION: 1. No acute CT finding. Age related volume loss with chronic small-vessel ischemic changes of the white matter and an old left occipital infarction. 2. Old healed left parietal skull fracture. 3. Mild mucosal thickening of the paranasal sinuses, improved since the study of October. Electronically Signed   By: Oneil Officer M.D.   On: 12/03/2023 08:35   (Echo, Carotid, EGD, Colonoscopy, ERCP)    Subjective: Pt c/o fatigue    Discharge Exam: Vitals:   12/07/23 0356 12/07/23 0852  BP: (!) 115/90 (!) 134/97  Pulse: 75 (!) 105   Resp: 20 17  Temp: 97.6 F (36.4 C) 97.8 F (36.6 C)  SpO2: 96% 93%   Vitals:   12/07/23 0356 12/07/23 0457 12/07/23 0545 12/07/23 0852  BP: (!) 115/90   (!) 134/97  Pulse: 75   (!) 105  Resp: 20   17  Temp: 97.6 F (36.4 C)   97.8 F (36.6 C)  TempSrc: Oral     SpO2: 96%   93%  Weight:  77.7 kg 77.8 kg   Height:        General: Pt is alert, awake, not in acute distress Cardiovascular: S1/S2 +, no rubs, no gallops Respiratory: decreased breath sounds b/l  Abdominal: Soft, NT, ND, bowel sounds + Extremities: no edema, no cyanosis    The results of significant diagnostics from this hospitalization (including imaging, microbiology, ancillary and laboratory) are listed below for reference.     Microbiology: Recent Results (from the past 240 hours)  Resp panel by RT-PCR (RSV, Flu A&B, Covid) Anterior Nasal Swab     Status: None   Collection Time: 12/03/23 11:19 AM   Specimen: Anterior Nasal Swab  Result Value Ref Range Status   SARS Coronavirus 2 by RT PCR NEGATIVE NEGATIVE Final    Comment: (NOTE) SARS-CoV-2 target nucleic acids are NOT DETECTED.  The SARS-CoV-2 RNA is generally detectable in upper respiratory specimens during the acute phase of infection. The lowest concentration of SARS-CoV-2 viral copies this assay can detect is 138 copies/mL. A negative result does not preclude SARS-Cov-2 infection and should not be used as the sole basis for treatment or other patient management decisions. A negative result may occur with  improper specimen collection/handling, submission of specimen other than nasopharyngeal swab, presence of viral mutation(s) within the areas targeted by this assay, and inadequate number of viral copies(<138 copies/mL). A negative result must be combined with clinical observations, patient history, and epidemiological information. The expected result is Negative.  Fact Sheet for Patients:  bloggercourse.com  Fact  Sheet for Healthcare Providers:  seriousbroker.it  This test is no t yet approved or cleared by the [place]  FDA and  has been authorized for detection and/or diagnosis of SARS-CoV-2 by FDA under an Emergency Use Authorization (EUA). This EUA will remain  in effect (meaning this test can be used) for the duration of the COVID-19 declaration under Section 564(b)(1) of the Act, 21 U.S.C.section 360bbb-3(b)(1), unless the authorization is terminated  or revoked sooner.  Influenza A by PCR NEGATIVE NEGATIVE Final   Influenza B by PCR NEGATIVE NEGATIVE Final    Comment: (NOTE) The Xpert Xpress SARS-CoV-2/FLU/RSV plus assay is intended as an aid in the diagnosis of influenza from Nasopharyngeal swab specimens and should not be used as a sole basis for treatment. Nasal washings and aspirates are unacceptable for Xpert Xpress SARS-CoV-2/FLU/RSV testing.  Fact Sheet for Patients: bloggercourse.com  Fact Sheet for Healthcare Providers: seriousbroker.it  This test is not yet approved or cleared by the [place]  FDA and has been authorized for detection and/or diagnosis of SARS-CoV-2 by FDA under an Emergency Use Authorization (EUA). This EUA will remain in effect (meaning this test can be used) for the duration of the COVID-19 declaration under Section 564(b)(1) of the Act, 21 U.S.C. section 360bbb-3(b)(1), unless the authorization is terminated or revoked.     Resp Syncytial Virus by PCR NEGATIVE NEGATIVE Final    Comment: (NOTE) Fact Sheet for Patients: bloggercourse.com  Fact Sheet for Healthcare Providers: seriousbroker.it  This test is not yet approved or cleared by the [place]  FDA and has been authorized for detection and/or diagnosis of SARS-CoV-2 by FDA under an Emergency Use Authorization (EUA). This EUA will remain in effect  (meaning this test can be used) for the duration of the COVID-19 declaration under Section 564(b)(1) of the Act, 21 U.S.C. section 360bbb-3(b)(1), unless the authorization is terminated or revoked.  Performed at [facility], [address] [place], [place] [address]   MRSA Next Gen by PCR, Nasal     Status: Abnormal   Collection Time: 12/07/23  8:00 AM   Specimen: Nasal Mucosa; Nasal Swab  Result Value Ref Range Status   MRSA by PCR Next Gen DETECTED (A) NOT DETECTED Final    Comment: RESULT CALLED TO, READ BACK BY AND VERIFIED WITH: ALEX ASIDO AT 1057 12/07/23.PMF (NOTE) The GeneXpert MRSA Assay (FDA approved for NASAL specimens only), is one component of a comprehensive MRSA colonization surveillance program. It is not intended to diagnose MRSA infection nor to guide or monitor treatment for MRSA infections. Test performance is not FDA approved in patients less than 53 years old. Performed at [facility], [address] [place], [place] [address]      Labs: BNP (last 3 results) Recent Labs    07/31/23 1019 09/10/23 1606 12/03/23 0802  BNP 901.7* 647.1* 1,056.2*   Basic Metabolic Panel: Recent Labs  Lab 12/03/23 0802 12/04/23 1205 12/05/23 0325 12/06/23 0440 12/07/23 0529  NA 137 135 137 133* 131*  K 5.3* 5.1 5.0 4.7 4.6  CL 101 101 103 99 100  CO2 24 26 23 24 22   GLUCOSE 117* 124* 94 91 98  BUN 43* 48* 50* 60* 62*  CREATININE 3.05* 3.30* 3.34* 3.30* 3.01*  CALCIUM  9.0 8.9 8.7* 8.9 8.6*   Liver Function Tests: Recent Labs  Lab 12/03/23 0802 12/04/23 1205  AST 31 21  ALT 8 10  ALKPHOS 150* 120  BILITOT 1.9* 1.6*  PROT 7.8 7.3  ALBUMIN  2.6* 2.5*   No results for input(s): LIPASE, AMYLASE in the last 168 hours. No results for input(s): AMMONIA in the last 168 hours. CBC: Recent Labs  Lab 12/03/23 0802 12/04/23 0946 12/05/23 0325 12/06/23 0440 12/07/23 0529  WBC 6.0 5.5 6.0 5.4 5.6  HGB 11.9* 11.9* 11.8* 12.3* 11.6*   HCT 39.8 39.5 40.6 40.5 37.8*  MCV 100.0 101.5* 103.3* 99.0 96.4  PLT 170 160 158 161 156   Cardiac Enzymes: No  results for input(s): CKTOTAL, CKMB, CKMBINDEX, TROPONINI in the last 168 hours. BNP: Invalid input(s): POCBNP CBG: Recent Labs  Lab 12/03/23 0807  GLUCAP 102*   D-Dimer No results for input(s): DDIMER in the last 72 hours. Hgb A1c No results for input(s): HGBA1C in the last 72 hours. Lipid Profile No results for input(s): CHOL, HDL, LDLCALC, TRIG, CHOLHDL, LDLDIRECT in the last 72 hours. Thyroid  function studies No results for input(s): TSH, T4TOTAL, T3FREE, THYROIDAB in the last 72 hours.  Invalid input(s): FREET3 Anemia work up No results for input(s): VITAMINB12, FOLATE, FERRITIN, TIBC, IRON, RETICCTPCT in the last 72 hours. Urinalysis    Component Value Date/Time   COLORURINE YELLOW (A) 12/03/2023 1200   APPEARANCEUR CLEAR (A) 12/03/2023 1200   LABSPEC 1.009 12/03/2023 1200   PHURINE 5.0 12/03/2023 1200   GLUCOSEU 150 (A) 12/03/2023 1200   HGBUR NEGATIVE 12/03/2023 1200   BILIRUBINUR NEGATIVE 12/03/2023 1200   BILIRUBINUR negative 03/31/2018 1452   KETONESUR NEGATIVE 12/03/2023 1200   PROTEINUR NEGATIVE 12/03/2023 1200   UROBILINOGEN 0.2 03/31/2018 1452   NITRITE NEGATIVE 12/03/2023 1200   LEUKOCYTESUR NEGATIVE 12/03/2023 1200   Sepsis Labs Recent Labs  Lab 12/04/23 0946 12/05/23 0325 12/06/23 0440 12/07/23 0529  WBC 5.5 6.0 5.4 5.6   Microbiology Recent Results (from the past 240 hours)  Resp panel by RT-PCR (RSV, Flu A&B, Covid) Anterior Nasal Swab     Status: None   Collection Time: 12/03/23 11:19 AM   Specimen: Anterior Nasal Swab  Result Value Ref Range Status   SARS Coronavirus 2 by RT PCR NEGATIVE NEGATIVE Final    Comment: (NOTE) SARS-CoV-2 target nucleic acids are NOT DETECTED.  The SARS-CoV-2 RNA is generally detectable in upper respiratory specimens during the acute phase of  infection. The lowest concentration of SARS-CoV-2 viral copies this assay can detect is 138 copies/mL. A negative result does not preclude SARS-Cov-2 infection and should not be used as the sole basis for treatment or other patient management decisions. A negative result may occur with  improper specimen collection/handling, submission of specimen other than nasopharyngeal swab, presence of viral mutation(s) within the areas targeted by this assay, and inadequate number of viral copies(<138 copies/mL). A negative result must be combined with clinical observations, patient history, and epidemiological information. The expected result is Negative.  Fact Sheet for Patients:  bloggercourse.com  Fact Sheet for Healthcare Providers:  seriousbroker.it  This test is no t yet approved or cleared by the [place]  FDA and  has been authorized for detection and/or diagnosis of SARS-CoV-2 by FDA under an Emergency Use Authorization (EUA). This EUA will remain  in effect (meaning this test can be used) for the duration of the COVID-19 declaration under Section 564(b)(1) of the Act, 21 U.S.C.section 360bbb-3(b)(1), unless the authorization is terminated  or revoked sooner.       Influenza A by PCR NEGATIVE NEGATIVE Final   Influenza B by PCR NEGATIVE NEGATIVE Final    Comment: (NOTE) The Xpert Xpress SARS-CoV-2/FLU/RSV plus assay is intended as an aid in the diagnosis of influenza from Nasopharyngeal swab specimens and should not be used as a sole basis for treatment. Nasal washings and aspirates are unacceptable for Xpert Xpress SARS-CoV-2/FLU/RSV testing.  Fact Sheet for Patients: bloggercourse.com  Fact Sheet for Healthcare Providers: seriousbroker.it  This test is not yet approved or cleared by the [place]  FDA and has been authorized for detection and/or diagnosis of SARS-CoV-2  by FDA under an Emergency Use Authorization (EUA). This EUA  will remain in effect (meaning this test can be used) for the duration of the COVID-19 declaration under Section 564(b)(1) of the Act, 21 U.S.C. section 360bbb-3(b)(1), unless the authorization is terminated or revoked.     Resp Syncytial Virus by PCR NEGATIVE NEGATIVE Final    Comment: (NOTE) Fact Sheet for Patients: bloggercourse.com  Fact Sheet for Healthcare Providers: seriousbroker.it  This test is not yet approved or cleared by the [place]  FDA and has been authorized for detection and/or diagnosis of SARS-CoV-2 by FDA under an Emergency Use Authorization (EUA). This EUA will remain in effect (meaning this test can be used) for the duration of the COVID-19 declaration under Section 564(b)(1) of the Act, 21 U.S.C. section 360bbb-3(b)(1), unless the authorization is terminated or revoked.  Performed at [facility], [address] [place], [place] [address]   MRSA Next Gen by PCR, Nasal     Status: Abnormal   Collection Time: 12/07/23  8:00 AM   Specimen: Nasal Mucosa; Nasal Swab  Result Value Ref Range Status   MRSA by PCR Next Gen DETECTED (A) NOT DETECTED Final    Comment: RESULT CALLED TO, READ BACK BY AND VERIFIED WITH: ALEX ASIDO AT 1057 12/07/23.PMF (NOTE) The GeneXpert MRSA Assay (FDA approved for NASAL specimens only), is one component of a comprehensive MRSA colonization surveillance program. It is not intended to diagnose MRSA infection nor to guide or monitor treatment for MRSA infections. Test performance is not FDA approved in patients less than 60 years old. Performed at [facility], [address] [place], [place] [address]      Time coordinating discharge: Over 30 minutes  SIGNED:   Anthony CHRISTELLA Pouch, MD  [facility] 12/07/2023, 12:15 PM Pager   If 7PM-7AM, please contact  night-coverage [email or web address]

## 2023-12-08 ENCOUNTER — Telehealth: Payer: Self-pay

## 2023-12-08 ENCOUNTER — Ambulatory Visit: Payer: Medicare Other

## 2023-12-08 NOTE — Patient Outreach (Signed)
 Care Management  Transitions of Care Program Transitions of Care Post-discharge Initial Outreach   12/08/2023 Name: Taylor [name] MRN: [id number] DOB: [birth year]  Subjective: Taylor [name] is a 83 y.o. year old male who is a primary care patient of [name], Nancyann FORBES, MD. The Care Management team Engaged with patient Engaged with patient by telephone to assess and address transitions of care needs.   Consent to Services:  Patient was given information about care management services, agreed to services, and gave verbal consent to participate.   Assessment:  Date of Discharge: 12/07/23 Discharge Facility: [facility]) Type of Discharge: Inpatient Admission Primary Inpatient Discharge Diagnosis:: Weakness, Dizziness and Double Vision  SDOH Interventions    Flowsheet Row Telephone from 12/08/2023 in [place] [facility] DEPARTMENT Patient Outreach from 10/22/2023 in [place] POPULATION HEALTH DEPARTMENT Clinical Support from 05/12/2023 in [facility] Telephone from 04/30/2023 in [facility] Community Care Coordination Telephone from 04/08/2023 in [facility] Care Coordination Telephone from 02/28/2023 in [facility] Care Coordination  SDOH Interventions        Food Insecurity Interventions Intervention Not Indicated Intervention Not Indicated Intervention Not Indicated Intervention Not Indicated Intervention Not Indicated Intervention Not Indicated  Housing Interventions Intervention Not Indicated Intervention Not Indicated Intervention Not Indicated Intervention Not Indicated Intervention Not Indicated Intervention Not Indicated  Transportation Interventions Intervention Not Indicated Intervention Not Indicated Intervention Not Indicated Intervention Not Indicated, Patient Resources (Friends/Family) Intervention Not Indicated Intervention Not Indicated  Utilities Interventions  Intervention Not Indicated -- Intervention Not Indicated -- -- --  Alcohol Usage Interventions -- -- Intervention Not Indicated (Score <7) -- -- --  Depression Interventions/Treatment  -- -- Currently on Treatment -- -- --  Financial Strain Interventions -- Intervention Not Indicated Intervention Not Indicated -- -- --  Physical Activity Interventions -- Other (Comments)  [Currenlty completing Home Health physical therapy] Intervention Not Indicated -- -- --  Stress Interventions -- -- Intervention Not Indicated -- -- --  Social Connections Interventions Intervention Not Indicated Intervention Not Indicated Intervention Not Indicated -- -- --  Health Literacy Interventions -- Intervention Not Indicated -- -- -- --        Goals Addressed             This Visit's Progress    Care Management       TOC Care Plan       Current Barriers:  Knowledge Deficits related to plan of care for management of CHF  Chronic Disease Management support and education needs related to CHF   RNCM Clinical Goal(s):  Patient will work with the Care Management team over the next 30 days to address Transition of Care Barriers: Medication access Medication Management Diet/Nutrition/Food Resources Support at home Provider appointments Equipment/DME Functional/Safety verbalize understanding of plan for management of CHF as evidenced by no admissions to the hospital in the next 30 days demonstrate understanding of rationale for each prescribed medication as evidenced by verbal feedback  through collaboration with RN Care manager, provider, and care team.   Interventions: Evaluation of current treatment plan related to  self management and patient's adherence to plan as established by provider   Heart Failure Interventions:  (Status:  New goal.) Short Term Goal Provided education on low sodium diet Provided education about placing scale on hard, flat surface Advised patient to weigh each morning after  emptying bladder Discussed importance of daily weight and advised patient to weigh and record daily  Reviewed role of diuretics in prevention of fluid overload and management of heart failure; Discussed the importance of keeping all appointments with provider  Patient Goals/Self-Care Activities: Participate in Transition of Care Program/Attend [facility] scheduled calls Notify RN Care Manager of [facility] call rescheduling needs Take all medications as prescribed Attend all scheduled provider appointments Call pharmacy for medication refills 3-7 days in advance of running out of medications Call provider office for new concerns or questions   Follow Up Plan:  Telephone follow up appointment with care management team member scheduled for:  Tuesday January 21st at 3:00pm         Outreach completed today with the patient and the patient's spouse. The patient was admitted for weakness, double vision and dizziness. Admitted for possible CVA but not confirmed. The patient has CHF and COPD on oxygen  at baseline. He has been referred to cardiac rehab and they would like to start in about two weeks. He has a cut on his tongue which is painful and he states he has a hard time with food and drinks. PCP follow up arranged. Enrolled in the Outreach program  Medford Balboa, Programmer, Systems [employer]-Population Health [phone]

## 2023-12-15 ENCOUNTER — Ambulatory Visit: Payer: Medicare Other | Admitting: Family Medicine

## 2023-12-16 ENCOUNTER — Ambulatory Visit: Payer: Medicare Other | Admitting: Family Medicine

## 2023-12-16 ENCOUNTER — Telehealth: Payer: Self-pay

## 2023-12-16 ENCOUNTER — Other Ambulatory Visit: Payer: Self-pay

## 2023-12-16 NOTE — Patient Outreach (Signed)
  Care Management  Transitions of Care Program Transitions of Care Post-discharge week 2  12/16/2023 Name: Taylor [name] MRN: [id number] DOB: [birth year]  Subjective: Taylor [name] is a 83 y.o. year old male who is a primary care patient of [name], Demetrios Isaacs, MD. The Care Management team was unable to reach the patient by phone to assess and address transitions of care needs.   Plan: Additional outreach attempts will be made to reach the patient enrolled in the [facility] Program (Post Inpatient/ED Visit).  Marland Kitchenchs

## 2023-12-19 ENCOUNTER — Ambulatory Visit: Payer: Medicare Other | Admitting: Family Medicine

## 2023-12-19 ENCOUNTER — Emergency Department: Payer: Medicare Other

## 2023-12-19 ENCOUNTER — Inpatient Hospital Stay: Payer: Medicare Other

## 2023-12-19 ENCOUNTER — Inpatient Hospital Stay
Admission: EM | Admit: 2023-12-19 | Discharge: 2023-12-22 | DRG: 291 | Disposition: A | Discharge status: Hospice | Payer: Medicare Other | Attending: Internal Medicine | Admitting: Internal Medicine

## 2023-12-19 ENCOUNTER — Other Ambulatory Visit: Payer: Self-pay

## 2023-12-19 ENCOUNTER — Other Ambulatory Visit: Payer: Medicare Other

## 2023-12-19 DIAGNOSIS — T502X5A Adverse effect of carbonic-anhydrase inhibitors, benzothiadiazides and other diuretics, initial encounter: Secondary | ICD-10-CM | POA: Diagnosis present

## 2023-12-19 DIAGNOSIS — I499 Cardiac arrhythmia, unspecified: Secondary | ICD-10-CM | POA: Diagnosis not present

## 2023-12-19 DIAGNOSIS — R110 Nausea: Secondary | ICD-10-CM | POA: Diagnosis not present

## 2023-12-19 DIAGNOSIS — Z1152 Encounter for screening for COVID-19: Secondary | ICD-10-CM | POA: Diagnosis not present

## 2023-12-19 DIAGNOSIS — Z7984 Long term (current) use of oral hypoglycemic drugs: Secondary | ICD-10-CM | POA: Diagnosis not present

## 2023-12-19 DIAGNOSIS — D631 Anemia in chronic kidney disease: Secondary | ICD-10-CM | POA: Diagnosis not present

## 2023-12-19 DIAGNOSIS — E8770 Fluid overload, unspecified: Secondary | ICD-10-CM | POA: Diagnosis not present

## 2023-12-19 DIAGNOSIS — J811 Chronic pulmonary edema: Secondary | ICD-10-CM | POA: Diagnosis not present

## 2023-12-19 DIAGNOSIS — I2510 Atherosclerotic heart disease of native coronary artery without angina pectoris: Secondary | ICD-10-CM | POA: Diagnosis present

## 2023-12-19 DIAGNOSIS — I5082 Biventricular heart failure: Secondary | ICD-10-CM | POA: Diagnosis present

## 2023-12-19 DIAGNOSIS — R188 Other ascites: Secondary | ICD-10-CM

## 2023-12-19 DIAGNOSIS — E871 Hypo-osmolality and hyponatremia: Secondary | ICD-10-CM | POA: Diagnosis not present

## 2023-12-19 DIAGNOSIS — E1122 Type 2 diabetes mellitus with diabetic chronic kidney disease: Secondary | ICD-10-CM | POA: Diagnosis present

## 2023-12-19 DIAGNOSIS — R0602 Shortness of breath: Principal | ICD-10-CM

## 2023-12-19 DIAGNOSIS — K7460 Unspecified cirrhosis of liver: Secondary | ICD-10-CM | POA: Diagnosis present

## 2023-12-19 DIAGNOSIS — Z8249 Family history of ischemic heart disease and other diseases of the circulatory system: Secondary | ICD-10-CM

## 2023-12-19 DIAGNOSIS — Z79899 Other long term (current) drug therapy: Secondary | ICD-10-CM

## 2023-12-19 DIAGNOSIS — K219 Gastro-esophageal reflux disease without esophagitis: Secondary | ICD-10-CM | POA: Diagnosis present

## 2023-12-19 DIAGNOSIS — N184 Chronic kidney disease, stage 4 (severe): Secondary | ICD-10-CM | POA: Diagnosis present

## 2023-12-19 DIAGNOSIS — Z87891 Personal history of nicotine dependence: Secondary | ICD-10-CM

## 2023-12-19 DIAGNOSIS — I130 Hypertensive heart and chronic kidney disease with heart failure and stage 1 through stage 4 chronic kidney disease, or unspecified chronic kidney disease: Principal | ICD-10-CM | POA: Diagnosis present

## 2023-12-19 DIAGNOSIS — E785 Hyperlipidemia, unspecified: Secondary | ICD-10-CM | POA: Diagnosis not present

## 2023-12-19 DIAGNOSIS — Z955 Presence of coronary angioplasty implant and graft: Secondary | ICD-10-CM

## 2023-12-19 DIAGNOSIS — N179 Acute kidney failure, unspecified: Secondary | ICD-10-CM | POA: Diagnosis present

## 2023-12-19 DIAGNOSIS — R443 Hallucinations, unspecified: Secondary | ICD-10-CM | POA: Diagnosis present

## 2023-12-19 DIAGNOSIS — Z8673 Personal history of transient ischemic attack (TIA), and cerebral infarction without residual deficits: Secondary | ICD-10-CM

## 2023-12-19 DIAGNOSIS — R0689 Other abnormalities of breathing: Secondary | ICD-10-CM | POA: Diagnosis not present

## 2023-12-19 DIAGNOSIS — Z8261 Family history of arthritis: Secondary | ICD-10-CM

## 2023-12-19 DIAGNOSIS — Z808 Family history of malignant neoplasm of other organs or systems: Secondary | ICD-10-CM

## 2023-12-19 DIAGNOSIS — Z7901 Long term (current) use of anticoagulants: Secondary | ICD-10-CM

## 2023-12-19 DIAGNOSIS — I480 Paroxysmal atrial fibrillation: Secondary | ICD-10-CM | POA: Diagnosis present

## 2023-12-19 DIAGNOSIS — I5043 Acute on chronic combined systolic (congestive) and diastolic (congestive) heart failure: Secondary | ICD-10-CM | POA: Diagnosis present

## 2023-12-19 DIAGNOSIS — Z743 Need for continuous supervision: Secondary | ICD-10-CM | POA: Diagnosis not present

## 2023-12-19 DIAGNOSIS — I509 Heart failure, unspecified: Secondary | ICD-10-CM | POA: Diagnosis not present

## 2023-12-19 DIAGNOSIS — J449 Chronic obstructive pulmonary disease, unspecified: Secondary | ICD-10-CM | POA: Diagnosis not present

## 2023-12-19 DIAGNOSIS — Z8551 Personal history of malignant neoplasm of bladder: Secondary | ICD-10-CM

## 2023-12-19 DIAGNOSIS — R040 Epistaxis: Secondary | ICD-10-CM | POA: Diagnosis present

## 2023-12-19 DIAGNOSIS — I110 Hypertensive heart disease with heart failure: Secondary | ICD-10-CM | POA: Diagnosis not present

## 2023-12-19 DIAGNOSIS — N1832 Chronic kidney disease, stage 3b: Secondary | ICD-10-CM | POA: Diagnosis not present

## 2023-12-19 DIAGNOSIS — Z7982 Long term (current) use of aspirin: Secondary | ICD-10-CM

## 2023-12-19 DIAGNOSIS — Z8616 Personal history of COVID-19: Secondary | ICD-10-CM | POA: Diagnosis not present

## 2023-12-19 DIAGNOSIS — M109 Gout, unspecified: Secondary | ICD-10-CM | POA: Diagnosis present

## 2023-12-19 DIAGNOSIS — R918 Other nonspecific abnormal finding of lung field: Secondary | ICD-10-CM | POA: Diagnosis not present

## 2023-12-19 DIAGNOSIS — Z95810 Presence of automatic (implantable) cardiac defibrillator: Secondary | ICD-10-CM

## 2023-12-19 DIAGNOSIS — Z515 Encounter for palliative care: Secondary | ICD-10-CM | POA: Diagnosis not present

## 2023-12-19 DIAGNOSIS — Z85820 Personal history of malignant melanoma of skin: Secondary | ICD-10-CM

## 2023-12-19 DIAGNOSIS — J810 Acute pulmonary edema: Secondary | ICD-10-CM | POA: Diagnosis not present

## 2023-12-19 DIAGNOSIS — J9621 Acute and chronic respiratory failure with hypoxia: Secondary | ICD-10-CM | POA: Diagnosis not present

## 2023-12-19 DIAGNOSIS — J90 Pleural effusion, not elsewhere classified: Secondary | ICD-10-CM | POA: Diagnosis not present

## 2023-12-19 DIAGNOSIS — I2781 Cor pulmonale (chronic): Secondary | ICD-10-CM | POA: Diagnosis present

## 2023-12-19 DIAGNOSIS — J9600 Acute respiratory failure, unspecified whether with hypoxia or hypercapnia: Secondary | ICD-10-CM | POA: Diagnosis not present

## 2023-12-19 DIAGNOSIS — I252 Old myocardial infarction: Secondary | ICD-10-CM

## 2023-12-19 DIAGNOSIS — R001 Bradycardia, unspecified: Secondary | ICD-10-CM | POA: Diagnosis not present

## 2023-12-19 DIAGNOSIS — R0989 Other specified symptoms and signs involving the circulatory and respiratory systems: Secondary | ICD-10-CM | POA: Diagnosis not present

## 2023-12-19 DIAGNOSIS — I10 Essential (primary) hypertension: Secondary | ICD-10-CM | POA: Diagnosis not present

## 2023-12-19 DIAGNOSIS — Z951 Presence of aortocoronary bypass graft: Secondary | ICD-10-CM

## 2023-12-19 DIAGNOSIS — Z9981 Dependence on supplemental oxygen: Secondary | ICD-10-CM

## 2023-12-19 LAB — COMPREHENSIVE METABOLIC PANEL
ALT: 18 U/L (ref 0–44)
AST: 47 U/L — ABNORMAL HIGH (ref 15–41)
Albumin: 2.5 g/dL — ABNORMAL LOW (ref 3.5–5.0)
Alkaline Phosphatase: 150 U/L — ABNORMAL HIGH (ref 38–126)
Anion gap: 16 — ABNORMAL HIGH (ref 5–15)
BUN: 79 mg/dL — ABNORMAL HIGH (ref 8–23)
CO2: 23 mmol/L (ref 22–32)
Calcium: 8.3 mg/dL — ABNORMAL LOW (ref 8.9–10.3)
Chloride: 96 mmol/L — ABNORMAL LOW (ref 98–111)
Creatinine, Ser: 2.66 mg/dL — ABNORMAL HIGH (ref 0.61–1.24)
GFR, Estimated: 23 mL/min — ABNORMAL LOW (ref >60)
Glucose, Bld: 143 mg/dL — ABNORMAL HIGH (ref 70–99)
Potassium: 3.2 mmol/L — ABNORMAL LOW (ref 3.5–5.1)
Sodium: 135 mmol/L (ref 135–145)
Total Bilirubin: 1.9 mg/dL — ABNORMAL HIGH (ref 0.0–1.2)
Total Protein: 6.9 g/dL (ref 6.5–8.1)

## 2023-12-19 LAB — TROPONIN I (HIGH SENSITIVITY)
Troponin I (High Sensitivity): 66 ng/L — ABNORMAL HIGH (ref <18)
Troponin I (High Sensitivity): 64 ng/L — ABNORMAL HIGH (ref <18)

## 2023-12-19 LAB — CBC
HCT: 40.6 % (ref 39.0–52.0)
Hemoglobin: 12.1 g/dL — ABNORMAL LOW (ref 13.0–17.0)
MCH: 28.9 pg (ref 26.0–34.0)
MCHC: 29.8 g/dL — ABNORMAL LOW (ref 30.0–36.0)
MCV: 96.9 fL (ref 80.0–100.0)
Platelets: 111 10*3/uL — ABNORMAL LOW (ref 150–400)
RBC: 4.19 MIL/uL — ABNORMAL LOW (ref 4.22–5.81)
RDW: 19.1 % — ABNORMAL HIGH (ref 11.5–15.5)
WBC: 6.7 10*3/uL (ref 4.0–10.5)
nRBC: 0.7 % — ABNORMAL HIGH (ref 0.0–0.2)

## 2023-12-19 LAB — CBG MONITORING, ED
Glucose-Capillary: 142 mg/dL — ABNORMAL HIGH (ref 70–99)
Glucose-Capillary: 135 mg/dL — ABNORMAL HIGH (ref 70–99)
Glucose-Capillary: 138 mg/dL — ABNORMAL HIGH (ref 70–99)

## 2023-12-19 LAB — RESP PANEL BY RT-PCR (RSV, FLU A&B, COVID)  RVPGX2
Influenza A by PCR: NEGATIVE
Influenza B by PCR: NEGATIVE
Resp Syncytial Virus by PCR: NEGATIVE
SARS Coronavirus 2 by RT PCR: NEGATIVE

## 2023-12-19 LAB — BRAIN NATRIURETIC PEPTIDE: B Natriuretic Peptide: 597.2 pg/mL — ABNORMAL HIGH (ref 0.0–100.0)

## 2023-12-19 MED ORDER — ACETAMINOPHEN 500 MG PO TABS: 500.0000 mg | ORAL_TABLET | Freq: Four times a day (QID) | ORAL | Status: DC | PRN

## 2023-12-19 MED ORDER — IPRATROPIUM-ALBUTEROL 0.5-2.5 (3) MG/3ML IN SOLN
3.0000 mL | Freq: Once | RESPIRATORY_TRACT | Status: AC
  Administered 2023-12-19: 3 mL via RESPIRATORY_TRACT
  Filled 2023-12-19: qty 3

## 2023-12-19 MED ORDER — MAGNESIUM OXIDE -MG SUPPLEMENT 400 (240 MG) MG PO TABS
200.0000 mg | ORAL_TABLET | Freq: Two times a day (BID) | ORAL | Status: DC
  Administered 2023-12-19 – 2023-12-22 (×6): 200 mg via ORAL
  Filled 2023-12-19 (×6): qty 1

## 2023-12-19 MED ORDER — INSULIN ASPART 100 UNIT/ML IJ SOLN
0.0000 [IU] | Freq: Three times a day (TID) | INTRAMUSCULAR | Status: DC
  Administered 2023-12-19: 1 [IU] via SUBCUTANEOUS
  Administered 2023-12-20 (×2): 2 [IU] via SUBCUTANEOUS
  Administered 2023-12-21 – 2023-12-22 (×3): 1 [IU] via SUBCUTANEOUS
  Filled 2023-12-19 (×6): qty 1

## 2023-12-19 MED ORDER — PANTOPRAZOLE SODIUM 40 MG PO TBEC
40.0000 mg | DELAYED_RELEASE_TABLET | Freq: Every day | ORAL | Status: DC
  Administered 2023-12-19 – 2023-12-22 (×4): 40 mg via ORAL
  Filled 2023-12-19 (×4): qty 1

## 2023-12-19 MED ORDER — GUAIFENESIN ER 600 MG PO TB12
600.0000 mg | ORAL_TABLET | Freq: Two times a day (BID) | ORAL | Status: DC | PRN
  Administered 2023-12-20: 600 mg via ORAL
  Filled 2023-12-19: qty 1

## 2023-12-19 MED ORDER — CHOLESTYRAMINE 4 G PO PACK
4.0000 g | PACK | Freq: Two times a day (BID) | ORAL | Status: DC
  Administered 2023-12-19 – 2023-12-22 (×7): 4 g via ORAL
  Filled 2023-12-19 (×7): qty 1

## 2023-12-19 MED ORDER — ALBUTEROL SULFATE (2.5 MG/3ML) 0.083% IN NEBU: 2.5000 mg | INHALATION_SOLUTION | Freq: Four times a day (QID) | RESPIRATORY_TRACT | Status: DC | PRN

## 2023-12-19 MED ORDER — LIDOCAINE HCL (PF) 1 % IJ SOLN
10.0000 mL | Freq: Once | INTRAMUSCULAR | Status: AC
  Administered 2023-12-19: 10 mL

## 2023-12-19 MED ORDER — METHYLPREDNISOLONE SODIUM SUCC 125 MG IJ SOLR
125.0000 mg | Freq: Once | INTRAMUSCULAR | Status: AC
  Administered 2023-12-19: 125 mg via INTRAVENOUS
  Filled 2023-12-19: qty 2

## 2023-12-19 MED ORDER — FUROSEMIDE 10 MG/ML IJ SOLN
60.0000 mg | Freq: Once | INTRAMUSCULAR | Status: AC
  Administered 2023-12-19: 60 mg via INTRAVENOUS
  Filled 2023-12-19: qty 8

## 2023-12-19 MED ORDER — SODIUM CHLORIDE 0.9% FLUSH
3.0000 mL | Freq: Two times a day (BID) | INTRAVENOUS | Status: DC
  Administered 2023-12-19 – 2023-12-22 (×7): 3 mL via INTRAVENOUS

## 2023-12-19 MED ORDER — DEXTROMETHORPHAN POLISTIREX ER 30 MG/5ML PO SUER
5.0000 mL | Freq: Two times a day (BID) | ORAL | Status: DC | PRN
  Administered 2023-12-21: 30 mg via ORAL
  Filled 2023-12-19 (×4): qty 5

## 2023-12-19 MED ORDER — POTASSIUM CHLORIDE CRYS ER 10 MEQ PO TBCR
10.0000 meq | EXTENDED_RELEASE_TABLET | Freq: Two times a day (BID) | ORAL | Status: DC
  Administered 2023-12-19 – 2023-12-22 (×7): 10 meq via ORAL
  Filled 2023-12-19 (×4): qty 1
  Filled 2023-12-19: qty 0.5
  Filled 2023-12-19 (×2): qty 1

## 2023-12-19 MED ORDER — IPRATROPIUM-ALBUTEROL 0.5-2.5 (3) MG/3ML IN SOLN
3.0000 mL | RESPIRATORY_TRACT | Status: DC | PRN
  Administered 2023-12-20: 3 mL via RESPIRATORY_TRACT
  Filled 2023-12-19: qty 3

## 2023-12-19 MED ORDER — ONDANSETRON HCL 4 MG/2ML IJ SOLN: 4.0000 mg | Freq: Four times a day (QID) | INTRAMUSCULAR | Status: DC | PRN

## 2023-12-19 MED ORDER — ATORVASTATIN CALCIUM 20 MG PO TABS
40.0000 mg | ORAL_TABLET | Freq: Every day | ORAL | Status: DC
  Administered 2023-12-19 – 2023-12-22 (×4): 40 mg via ORAL
  Filled 2023-12-19 (×4): qty 2

## 2023-12-19 MED ORDER — ALBUTEROL SULFATE HFA 108 (90 BASE) MCG/ACT IN AERS: 2.0000 | INHALATION_SPRAY | Freq: Four times a day (QID) | RESPIRATORY_TRACT | Status: DC | PRN

## 2023-12-19 MED ORDER — FLUTICASONE PROPIONATE 50 MCG/ACT NA SUSP
2.0000 | Freq: Every day | NASAL | Status: DC
Start: 2023-12-20 — End: 2023-12-22
  Administered 2023-12-20 – 2023-12-22 (×3): 2 via NASAL
  Filled 2023-12-19 (×2): qty 16

## 2023-12-19 MED ORDER — ALBUMIN HUMAN 25 % IV SOLN
25.0000 g | Freq: Four times a day (QID) | INTRAVENOUS | Status: AC
  Administered 2023-12-19 – 2023-12-20 (×4): 25 g via INTRAVENOUS
  Filled 2023-12-19 (×4): qty 100

## 2023-12-19 MED ORDER — FUROSEMIDE 10 MG/ML IJ SOLN
60.0000 mg | Freq: Two times a day (BID) | INTRAMUSCULAR | Status: DC
  Administered 2023-12-19 – 2023-12-21 (×4): 60 mg via INTRAVENOUS
  Filled 2023-12-19 (×2): qty 6
  Filled 2023-12-19 (×2): qty 8

## 2023-12-19 MED ORDER — INSULIN ASPART 100 UNIT/ML IJ SOLN
0.0000 [IU] | Freq: Three times a day (TID) | INTRAMUSCULAR | Status: DC
  Administered 2023-12-19: 1 [IU] via SUBCUTANEOUS
  Filled 2023-12-19: qty 1

## 2023-12-19 MED ORDER — TRIAMCINOLONE ACETONIDE 0.1 % EX OINT: TOPICAL_OINTMENT | Freq: Two times a day (BID) | CUTANEOUS | Status: DC | PRN

## 2023-12-19 MED ORDER — SODIUM CHLORIDE 0.9% FLUSH: 3.0000 mL | INTRAVENOUS | Status: DC | PRN

## 2023-12-19 MED ORDER — DIPHENHYDRAMINE HCL 25 MG PO CAPS: 25.0000 mg | ORAL_CAPSULE | Freq: Four times a day (QID) | ORAL | Status: DC | PRN

## 2023-12-19 MED ORDER — CLOBETASOL PROPIONATE 0.05 % EX OINT: 1.0000 | TOPICAL_OINTMENT | Freq: Two times a day (BID) | CUTANEOUS | Status: DC | PRN

## 2023-12-19 MED ORDER — SODIUM CHLORIDE 0.9 % IV SOLN: 250.0000 mL | INTRAVENOUS | Status: AC | PRN

## 2023-12-19 NOTE — ED Triage Notes (Addendum)
Pt bib [facility] from home c/o SOB that started after getting up this morning. Pt wears 2.5L at baseline and presents to the ED on 4L sat at 98%. Pt has history of " Kidney issues' per EMS and has fluid removed in the past. Pt denies pain. AXO  EMS Vitals 134/97 100 HR  28 End tital CBG 154

## 2023-12-19 NOTE — Plan of Care (Addendum)
PMT consult noted. In to see patient, however patient is not in his room at this time. PMT will reattempt at a later time.

## 2023-12-19 NOTE — ED Provider Notes (Signed)
[facility] Provider Note    Event Date/Time   First MD Initiated Contact with Patient 12/19/23 [id number]     (approximate)  History   Chief Complaint: Shortness of Breath  HPI  Taylor [name] is a 83 y.o. male with a past medical history of COPD on 2.5 L of O2, hypertension, hyperlipidemia, gastric reflux, CHF, presents to the emergency department for shortness of breath.  According to the patient since 4 AM this morning he has been feeling worsening shortness of breath.  Denies any chest pain denies any increased cough congestion or fever.  Patient wears 2.5 L of O2 nasal cannula 24/7 but states he feels like he is not getting enough oxygen.  Patient does have diffuse wheeze on evaluation.  Physical Exam   Triage Vital Signs: ED Triage Vitals  Encounter Vitals Group     BP 12/19/23 0835 (!) 151/96     Systolic BP Percentile --      Diastolic BP Percentile --      Pulse Rate 12/19/23 0835 87     Resp 12/19/23 0835 (!) 22     Temp --      Temp src --      SpO2 12/19/23 0833 95 %     Weight 12/19/23 0836 165 lb (74.8 kg)     Height 12/19/23 0836 5\' 10"  (1.778 m)     Head Circumference --      Peak Flow --      Pain Score 12/19/23 0836 0     Pain Loc --      Pain Education --      Exclude from Growth Chart --     Most recent vital signs: Vitals:   12/19/23 0833 12/19/23 0835  BP:  (!) 151/96  Pulse:  87  Resp:  (!) 22  SpO2: 95% 100%    General: Awake, no distress.  CV:  Good peripheral perfusion.  Regular rate and rhythm  Resp:  Mild tachypnea.  Mild expiratory wheezes bilaterally. Abd:  No distention.  Soft, nontender.  No rebound or guarding.  ED Results / Procedures / Treatments   EKG  EKG viewed and interpreted by myself shows what appears to be atrial fibrillation at 91 bpm with a widened QRS, normal axis, nonspecific ST changes.  Largely unchanged from prior EKG.  Record review shows patient is on Eliquis  RADIOLOGY  I have reviewed  and interpreted the chest x-ray images.  Pacemaker/AICD present, signs of pulmonary edema bilaterally.  No obvious consolidation. Radiology has read the x-ray as vascular congestion with cardiomegaly and pulmonary edema small to moderate right sided pleural effusion.   MEDICATIONS ORDERED IN ED: Medications  methylPREDNISolone sodium succinate (SOLU-MEDROL) 125 mg/2 mL injection 125 mg (has no administration in time range)  ipratropium-albuterol (DUONEB) 0.5-2.5 (3) MG/3ML nebulizer solution 3 mL (has no administration in time range)  ipratropium-albuterol (DUONEB) 0.5-2.5 (3) MG/3ML nebulizer solution 3 mL (has no administration in time range)     IMPRESSION / MDM / ASSESSMENT AND PLAN / ED COURSE  I reviewed the triage vital signs and the nursing notes.  Patient's presentation is most consistent with acute presentation with potential threat to life or bodily function.  Patient presents to the emergency department for worsening shortness of breath since this morning.  Overall patient appears well he is speaking in 3-4 word sentences.  Patient presented on a nonrebreather we will titrate down.  Patient does have mild tachypnea with mild expiratory  wheezes bilaterally.  History of COPD we will treat with DuoNebs and Solu-Medrol.  We will check labs including cardiac enzymes obtain a chest x-ray to help rule out pneumonia or other infectious etiology, obtain a COVID/flu/RSV swab.  Patient agreeable to plan of care.  Patient's labs have resulted showing a negative COVID/flu test.  CBC is reassuring with a normal white blood cell count.  Patient's chemistry does show mild renal insufficiency.  Troponin slightly elevated at 66 although largely unchanged from historical values.  Patient's chest x-ray shows signs of pulmonary edema.  We will dose IV Lasix, BNP is elevated to 597.  Given the patient's shortness of breath dyspnea with pulmonary edema we will admit to the hospital service for further workup  treatment and additional diuresis.  Reassuringly patient was able to be titrated down to 4 L nasal cannula.  FINAL CLINICAL IMPRESSION(S) / ED DIAGNOSES   Dyspnea CHF exacerbation   Note:  This document was prepared using Dragon voice recognition software and may include unintentional dictation errors.   Minna Antis, MD 12/19/23 1053

## 2023-12-19 NOTE — ED Notes (Signed)
Spo2 monitor adjusted and changed. Wife at bedside

## 2023-12-19 NOTE — ED Notes (Signed)
Pt to [place].

## 2023-12-19 NOTE — Progress Notes (Signed)
OT Cancellation Note  Patient Details Name: SANDON YOHO MRN: [id number] DOB: 10-13-1941   Cancelled Treatment:    Reason Eval/Treat Not Completed: Patient at procedure or test/ unavailable. Pt undergoing paracentesis. Unavailable for OT evaluation at this time. Will re-attempt at later date/time as pt is available and medically appropriate.   Arman Filter., MPH, MS, OTR/L ascom [phone] 12/19/23, 2:07 PM

## 2023-12-19 NOTE — ED Notes (Signed)
Pt is sitting up eating dinner

## 2023-12-19 NOTE — ED Notes (Signed)
Back from [place].

## 2023-12-19 NOTE — Procedures (Signed)
PROCEDURE SUMMARY:  Successful US guided paracentesis from RUQ.  Yielded 6700 mL  of yellow fluid.  No immediate complications.  Pt tolerated well.   No labs ordered.  EBL < 5mL  Jailan Trimm N Chanta Bauers PA-C 12/19/2023 2:09 PM

## 2023-12-19 NOTE — Progress Notes (Signed)
[facility]  ROUNDING NOTE   Subjective:   Taylor [name] is a 83 y.o. with medical problems of sCHF with EF < 20%, CKD-4, HTN, HLD, DM, COPD on 2L O2, CAD with stent and hx of CABG, stroke, A fib on Eliquis, s/p of AICD, melanoma, psoriatic arthritis, pancreatitis, liver cirrhosis. Patient presents to ED with increased lower extremity edema and has been admitted for CHF (congestive heart failure) (HCC) [I50.9]  Patient is known to our practice and is followed by Dr Suezanne Jacquet. He was last seen in office in November for routine follow up. He states he began to shortness of breath when he awoke this morning. Baseline oxygen 2.5L, no 4L. Lower extremity edema has progressively worsened over the past few days.   Labs on ED arrival show potassium 3.2, BUN 79, creatinine 2.66 with GFR 23. Most recent ECHO from October has his EF less than 20 and moderate to severe TVR and MVR, mild AVR.   We have been consulted to evaluate acute kidney injury.    Objective:  Vital signs in last 24 hours:  Pulse Rate:  [66-87] 66 (01/24 1200) Resp:  [22-24] 24 (01/24 1200) BP: (123-151)/(96-107) 123/107 (01/24 1200) SpO2:  [95 %-100 %] 99 % (01/24 1200) Weight:  [74.8 kg] 74.8 kg (01/24 0836)  Weight change:  Filed Weights   12/19/23 0836  Weight: 74.8 kg    Intake/Output: No intake/output data recorded.   Intake/Output this shift:  No intake/output data recorded.  Physical Exam: General: NAD,   Head: Normocephalic, atraumatic. Moist oral mucosal membranes  Eyes: Anicteric  Lungs:  Crackles  Heart: Regular rate and rhythm  Abdomen:  Firm, nontender, distention  Extremities:  2+ peripheral edema.  Neurologic: Nonfocal, moving all four extremities  Skin: No lesions       Basic Metabolic Panel: Recent Labs  Lab 12/19/23 0841  NA 135  K 3.2*  CL 96*  CO2 23  GLUCOSE 143*  BUN 79*  CREATININE 2.66*  CALCIUM 8.3*    Liver Function Tests: Recent Labs  Lab 12/19/23 0841   AST 47*  ALT 18  ALKPHOS 150*  BILITOT 1.9*  PROT 6.9  ALBUMIN 2.5*   No results for input(s): "LIPASE", "AMYLASE" in the last 168 hours. No results for input(s): "AMMONIA" in the last 168 hours.  CBC: Recent Labs  Lab 12/19/23 0841  WBC 6.7  HGB 12.1*  HCT 40.6  MCV 96.9  PLT 111*    Cardiac Enzymes: No results for input(s): "CKTOTAL", "CKMB", "CKMBINDEX", "TROPONINI" in the last 168 hours.  BNP: Invalid input(s): "POCBNP"  CBG: Recent Labs  Lab 12/19/23 1159  GLUCAP 142*    Microbiology: Results for orders placed or performed during the hospital encounter of 12/19/23  Resp panel by RT-PCR (RSV, Flu A&B, Covid) Anterior Nasal Swab     Status: None   Collection Time: 12/19/23  8:54 AM   Specimen: Anterior Nasal Swab  Result Value Ref Range Status   SARS Coronavirus 2 by RT PCR NEGATIVE NEGATIVE Final    Comment: (NOTE) SARS-CoV-2 target nucleic acids are NOT DETECTED.  The SARS-CoV-2 RNA is generally detectable in upper respiratory specimens during the acute phase of infection. The lowest concentration of SARS-CoV-2 viral copies this assay can detect is 138 copies/mL. A negative result does not preclude SARS-Cov-2 infection and should not be used as the sole basis for treatment or other patient management decisions. A negative result may occur with  improper specimen collection/handling, submission of  specimen other than nasopharyngeal swab, presence of viral mutation(s) within the areas targeted by this assay, and inadequate number of viral copies(<138 copies/mL). A negative result must be combined with clinical observations, patient history, and epidemiological information. The expected result is Negative.  Fact Sheet for Patients:  BloggerCourse.com  Fact Sheet for Healthcare Providers:  SeriousBroker.it  This test is no t yet approved or cleared by the [place] FDA and  has been authorized for  detection and/or diagnosis of SARS-CoV-2 by FDA under an Emergency Use Authorization (EUA). This EUA will remain  in effect (meaning this test can be used) for the duration of the COVID-19 declaration under Section 564(b)(1) of the Act, 21 U.S.C.section 360bbb-3(b)(1), unless the authorization is terminated  or revoked sooner.       Influenza A by PCR NEGATIVE NEGATIVE Final   Influenza B by PCR NEGATIVE NEGATIVE Final    Comment: (NOTE) The Xpert Xpress SARS-CoV-2/FLU/RSV plus assay is intended as an aid in the diagnosis of influenza from Nasopharyngeal swab specimens and should not be used as a sole basis for treatment. Nasal washings and aspirates are unacceptable for Xpert Xpress SARS-CoV-2/FLU/RSV testing.  Fact Sheet for Patients: BloggerCourse.com  Fact Sheet for Healthcare Providers: SeriousBroker.it  This test is not yet approved or cleared by the [place] FDA and has been authorized for detection and/or diagnosis of SARS-CoV-2 by FDA under an Emergency Use Authorization (EUA). This EUA will remain in effect (meaning this test can be used) for the duration of the COVID-19 declaration under Section 564(b)(1) of the Act, 21 U.S.C. section 360bbb-3(b)(1), unless the authorization is terminated or revoked.     Resp Syncytial Virus by PCR NEGATIVE NEGATIVE Final    Comment: (NOTE) Fact Sheet for Patients: BloggerCourse.com  Fact Sheet for Healthcare Providers: SeriousBroker.it  This test is not yet approved or cleared by the [place] FDA and has been authorized for detection and/or diagnosis of SARS-CoV-2 by FDA under an Emergency Use Authorization (EUA). This EUA will remain in effect (meaning this test can be used) for the duration of the COVID-19 declaration under Section 564(b)(1) of the Act, 21 U.S.C. section 360bbb-3(b)(1), unless the authorization is  terminated or revoked.  Performed at [facility], [address] [place], [place] [address]     Coagulation Studies: No results for input(s): "LABPROT", "INR" in the last 72 hours.  Urinalysis: No results for input(s): "COLORURINE", "LABSPEC", "PHURINE", "GLUCOSEU", "HGBUR", "BILIRUBINUR", "KETONESUR", "PROTEINUR", "UROBILINOGEN", "NITRITE", "LEUKOCYTESUR" in the last 72 hours.  Invalid input(s): "APPERANCEUR"    Imaging: DG Chest Portable 1 View Result Date: 12/19/2023 CLINICAL DATA:  Shortness of breath. EXAM: PORTABLE CHEST 1 VIEW COMPARISON:  12/03/2023. FINDINGS: Low lung volume. There is small-to-moderate right pleural effusion, slightly increased since the prior study. There are probable associated compressive atelectatic changes in the right lung. There is probable trace left layering pleural effusion. There is moderate diffuse pulmonary vascular congestion. No pneumothorax. Stable mildly enlarged cardio-mediastinal silhouette. There is a left sided 3-lead pacemaker. No acute osseous abnormalities. The soft tissues are within normal limits. IMPRESSION: *Moderate diffuse pulmonary vascular congestion and cardiomegaly, favoring congestive heart failure/pulmonary edema. *Small-to-moderate right pleural effusion, slightly increased since the prior study. Probable trace left layering pleural effusion. *No pneumothorax. Electronically Signed   By: Jules Schick M.D.   On: 12/19/2023 09:11     Medications:    sodium chloride     [START ON 12/20/2023] albumin human      atorvastatin  40 mg  Oral Daily   cholestyramine  4 g Oral BID   clobetasol ointment  1 Application Topical BID   fluticasone  2 spray Each Nare Daily   furosemide  60 mg Intravenous BID   insulin aspart  0-9 Units Subcutaneous TID WC   Magnesium  125 mg Oral BID   pantoprazole  40 mg Oral Daily   potassium chloride SA  10 mEq Oral BID   sodium chloride flush  3 mL Intravenous Q12H   triamcinolone  ointment   Topical BID   sodium chloride, acetaminophen, albuterol, dextromethorphan, diphenhydrAMINE, guaiFENesin, ipratropium-albuterol, ondansetron (ZOFRAN) IV, sodium chloride flush  Assessment/ Plan:  [name] is a 83 y.o.  male with medical problems of sCHF with EF < 20%, CKD-4, HTN, HLD, DM, COPD on 2L O2, CAD with stent and hx of CABG, stroke, A fib on Eliquis, s/p of AICD, melanoma, psoriatic arthritis, pancreatitis, liver cirrhosis. Patient presents to ED with increased lower extremity edema and has been admitted for CHF (congestive heart failure) (HCC) [I50.9]   Cronic kidney disease stage IIIb with baseline creatinine 2.2 and GFR of 30 on 09/30/23. Renal ultrasound pending. Patient appears hypervolemic on exam. Would recommend transitioning to Furosemide drip and strict I and o's and daily weights.   2. Acute respiratory failure, likely secondary to CHF decompensation. Volume overloaded. Baseline oxygen, 2.5L, now 4L. Receiving IV furosemide 60mg  twice daily. Paracentesis ordered.   3. Anemia of chronic kidney disease Lab Results  Component Value Date   HGB 12.1 (L) 12/19/2023    Hgb within desired target.   4. Hypertension with chronic kidney disease. Home regimen includes isosorbide and torsemide.     LOS: 0 Zaivion Kundrat 1/24/20253:00 PM

## 2023-12-19 NOTE — H&P (Addendum)
History and Physical    Taylor [name] XBM:[id number] DOB: 08/02/1941 DOA: 12/19/2023  PCP: Malva Limes, MD (Confirm with patient/family/NH records and if not entered, this has to be entered at [facility] point of entry) Patient coming from: Home  I have personally briefly reviewed patient's old medical records in [facility] Link  Chief Complaint: Shortness of breath, purulent fluid  HPI: Taylor [name] is a 83 y.o. male with medical history significant of chronic combined HFrEF and HFpEF with LVEF less than 20% status post AICD and PPM, CKD stage IV, cirrhosis, with chronic ascites, IIDM, HTN, HLD, GERD, CVA, PAF on Eliquis, presented with worsening of shortness of breath and abdominal distention.  Patient has history of CHF and CKD at home on 20 mg of torsemide recently to preserve kidney function however 1 week ago he started to have increasing shortness of breath and noticeable abdominal distention which is suspect to be a worsening of ascites.  Denied any abdominal pain no nausea vomiting or diarrhea.  No cough no chest pains.  For the last 7+ days he tried to increase his torsemide from 20 mg to 60 mg daily with some improvement however he continued to see buildup of fluid in his belly and increasing exertional dyspnea.  Since yesterday patient also started to have intermittent nosebleed and he also complaining about increasing of whole body itchiness. ED Course: Afebrile, blood pressure 120/90, ulceration 100% on 4 L.  Chest x-ray showed pulmonary edema, creatinine 2.6, BUN 79, bicarb 23, K3.2, albumin 2.5  Patient was started on IV Lasix 60 mg x 1 in the ED.  Review of Systems: As per HPI otherwise 14 point review of systems negative.    Past Medical History:  Diagnosis Date   AAA (abdominal aortic aneurysm) (HCC) 06/03/2007   [facility]; Dr. Hart Rochester   AICD (automatic cardioverter/defibrillator) present    Arrhythmia    atrial fibrillation   Barrett's esophagus     Bladder cancer (HCC)    Bradycardia    CAD (coronary artery disease)    CAP (community acquired pneumonia) 11/13/2019   CHF (congestive heart failure) (HCC)    Cluster headache    COVID-19 09/27/2021   DDD (degenerative disc disease), lumbar    Diabetes mellitus without complication (HCC)    Dyspnea    WITH EXERTION   Edema    LEFT ANKLE   Fracture of skull base (HCC) 1997   due to fall   GERD (gastroesophageal reflux disease)    Gout    History of bladder cancer 12/1995   Hyperlipidemia    Hypertension    Hypocalcemia 04/19/2020   Possibly secondary to diuretics.   Low=5.9 04/19/2020   Malignant melanoma (HCC) 12/2012   right dorsal forearm excised   Myocardial infarction [facility])    LAST 2014   Osteoarthritis of knee    Other specified complication of vascular prosthetic devices, implants and grafts, initial encounter (HCC) 08/22/2021   Pacemaker 10/10/2006   Pancreatitis 11/22/2019   Pneumonia    2016   Pre-diabetes    Psoriasis    Rib fracture 1997   due to fall   Sleep apnea    CPAP   Stroke [facility])    Venous incompetence     Past Surgical History:  Procedure Laterality Date   ABDOMINAL AORTIC ANEURYSM REPAIR  06/03/2007   [facility]; Dr. Hart Rochester   ANGIOPLASTY  1994   MI   BLADDER TUMOR EXCISION  12/1995  CAROTID PTA/STENT INTERVENTION Left 07/23/2022   Procedure: CAROTID PTA/STENT INTERVENTION;  Surgeon: Renford Dills, MD;  Location: [facility] INVASIVE CV LAB;  Service: Cardiovascular;  Laterality: Left;   CATARACT EXTRACTION W/PHACO Left 10/22/2016   Procedure: CATARACT EXTRACTION PHACO AND INTRAOCULAR LENS PLACEMENT (IOC);  Surgeon: Galen Manila, MD;  Location: [facility] ORS;  Service: Ophthalmology;  Laterality: Left;  [place] 47.7 AP% 18.4 CDE 8.78 Fluid pack lot # [id number]   CATARACT EXTRACTION W/PHACO Right 12/10/2016   Procedure: CATARACT EXTRACTION PHACO AND INTRAOCULAR LENS PLACEMENT (IOC);  Surgeon: Galen Manila, MD;  Location: [facility]  ORS;  Service: Ophthalmology;  Laterality: Right;  [place] 00:39 AP% 23.3 CDE 9.13 Fluid pack lot # [id number]   CORONARY ANGIOPLASTY     STENTS X 5   CORONARY ARTERY BYPASS GRAFT  09/22/2006   four   ELBOW BURSA SURGERY     DUE TO GOUT   INSERT / REPLACE / REMOVE PACEMAKER     MELANOMA EXCISION  12/2012   Right forearm   PPM GENERATOR CHANGEOUT N/A 02/13/2023   Procedure: PPM GENERATOR CHANGEOUT;  Surgeon: Marcina Millard, MD;  Location: [facility] INVASIVE CV LAB;  Service: Cardiovascular;  Laterality: N/A;     reports that he quit smoking about 24 years ago. His smoking use included cigarettes. He started smoking about 54 years ago. He has a 30 pack-year smoking history. He has never used smokeless tobacco. He reports that he does not drink alcohol and does not use drugs.  Allergies  Allergen Reactions   Amlodipine Besylate Swelling    Had a reaction when taking with colcrys    Crestor [Rosuvastatin]     Muscle cramps and pain. Tolerates atorvastatin    Family History  Problem Relation Age of Onset   Cancer Mother        Melanoma skin cancer   Heart attack Father 22   Cancer Father        throat cancer   Arthritis Brother      Prior to Admission medications   Medication Sig Start Date End Date Taking? Authorizing Provider  acetaminophen (TYLENOL) 500 MG tablet Take 500 mg by mouth every 6 (six) hours as needed for mild pain.    [provider]  albuterol (VENTOLIN HFA) 108 (90 Base) MCG/ACT inhaler Inhale 2 puffs into the lungs every 6 (six) hours as needed for wheezing or shortness of breath. 01/03/23   Malva Limes, MD  apixaban (ELIQUIS) 2.5 MG TABS tablet Take 1 tablet (2.5 mg total) by mouth 2 (two) times daily. 04/29/23   Arnetha Courser, MD  aspirin 81 MG EC tablet Take 81 mg by mouth daily.  Patient not taking: Reported on 12/03/2023    [provider]  atorvastatin (LIPITOR) 40 MG tablet TAKE 1 TABLET(40 MG) BY MOUTH DAILY 05/13/23   Malva Limes, MD   clobetasol ointment (TEMOVATE) 0.05 % Apply 1 Application topically 2 (two) times daily.    [provider]  dextromethorphan 15 MG/5ML syrup Take 5 mLs (15 mg total) by mouth 4 (four) times daily as needed for up to 14 days for cough. 12/07/23 12/21/23  Charise Killian, MD  empagliflozin (JARDIANCE) 10 MG TABS tablet Take 1 tablet by mouth daily. 10/14/23 10/13/24  [provider]  fluticasone (FLONASE) 50 MCG/ACT nasal spray Place 2 sprays into both nostrils daily. 10/27/23   Malva Limes, MD  guaiFENesin (MUCINEX PO) Take by mouth.    [provider]  ipratropium-albuterol (DUONEB) 0.5-2.5 (3)  MG/3ML SOLN Inhale 3 mLs into the lungs every 4 (four) hours as needed (wheezing, shob). 04/29/23   Arnetha Courser, MD  isosorbide mononitrate (IMDUR) 30 MG 24 hr tablet Take 0.5 tablets (15 mg total) by mouth daily. Patient not taking: Reported on 12/03/2023 09/18/23   Loyce Dys, MD  Magnesium 250 MG TABS Take 125 mg by mouth 2 (two) times daily.    [provider]  OXYGEN Inhale 2.5 L/min into the lungs continuous.    [provider]  pantoprazole (PROTONIX) 40 MG tablet TAKE 1 TABLET BY MOUTH EVERY DAY 11/11/23   Malva Limes, MD  potassium chloride SA (KLOR-CON M) 20 MEQ tablet Take 10 mEq by mouth 2 (two) times daily.    [provider]  torsemide (DEMADEX) 20 MG tablet Take 1 tablet (20 mg total) by mouth daily. Patient taking differently: Take 20 mg by mouth 2 (two) times daily. 09/19/23   Delma Freeze, FNP  triamcinolone ointment (KENALOG) 0.1 % Apply topically 2 (two) times daily. APPLY TOPICALLY TWICE DAILY AS DIRECTED 10/20/23   Malva Limes, MD    Physical Exam: Vitals:   12/19/23 0835 12/19/23 0836 12/19/23 1026 12/19/23 1200  BP: (!) 151/96   (!) 123/107  Pulse: 87   66  Resp: (!) 22   (!) 24  SpO2: 100%  100% 99%  Weight:  74.8 kg    Height:  5\' 10"  (1.778 m)      Constitutional: NAD, calm, comfortable Vitals:    12/19/23 0835 12/19/23 0836 12/19/23 1026 12/19/23 1200  BP: (!) 151/96   (!) 123/107  Pulse: 87   66  Resp: (!) 22   (!) 24  SpO2: 100%  100% 99%  Weight:  74.8 kg    Height:  5\' 10"  (1.778 m)     Eyes: PERRL, lids and conjunctivae normal ENMT: Mucous membranes are moist. Posterior pharynx clear of any exudate or lesions.Normal dentition.  Neck: normal, supple, no masses, no thyromegaly Respiratory: clear to auscultation bilaterally, no wheezing, fine crackles to the bilateral mid levels, increasing respiratory effort. No accessory muscle use.  Cardiovascular: Regular rate and rhythm, no murmurs / rubs / gallops. No extremity edema. 2+ pedal pulses. No carotid bruits.  Abdomen: no tenderness, no masses palpated. No hepatosplenomegaly. Bowel sounds positive.  Abdomen distended with signs of large amount of ascites Musculoskeletal: no clubbing / cyanosis. No joint deformity upper and lower extremities. Good ROM, no contractures. Normal muscle tone.  Skin: no rashes, lesions, ulcers. No induration Neurologic: CN 2-12 grossly intact. Sensation intact, DTR normal. Strength 5/5 in all 4.  Psychiatric: Normal judgment and insight. Alert and oriented x 3. Normal mood.    Labs on Admission: I have personally reviewed following labs and imaging studies  CBC: Recent Labs  Lab 12/19/23 0841  WBC 6.7  HGB 12.1*  HCT 40.6  MCV 96.9  PLT 111*   Basic Metabolic Panel: Recent Labs  Lab 12/19/23 0841  NA 135  K 3.2*  CL 96*  CO2 23  GLUCOSE 143*  BUN 79*  CREATININE 2.66*  CALCIUM 8.3*   GFR: Estimated Creatinine Clearance: 22.5 mL/min (A) (by C-G formula based on SCr of 2.66 mg/dL (H)). Liver Function Tests: Recent Labs  Lab 12/19/23 0841  AST 47*  ALT 18  ALKPHOS 150*  BILITOT 1.9*  PROT 6.9  ALBUMIN 2.5*   No results for input(s): "LIPASE", "AMYLASE" in the last 168 hours. No results for input(s): "AMMONIA" in  the last 168 hours. Coagulation Profile: No results for  input(s): "INR", "PROTIME" in the last 168 hours. Cardiac Enzymes: No results for input(s): "CKTOTAL", "CKMB", "CKMBINDEX", "TROPONINI" in the last 168 hours. BNP (last 3 results) No results for input(s): "PROBNP" in the last 8760 hours. HbA1C: No results for input(s): "HGBA1C" in the last 72 hours. CBG: Recent Labs  Lab 12/19/23 1159  GLUCAP 142*   Lipid Profile: No results for input(s): "CHOL", "HDL", "LDLCALC", "TRIG", "CHOLHDL", "LDLDIRECT" in the last 72 hours. Thyroid Function Tests: No results for input(s): "TSH", "T4TOTAL", "FREET4", "T3FREE", "THYROIDAB" in the last 72 hours. Anemia Panel: No results for input(s): "VITAMINB12", "FOLATE", "FERRITIN", "TIBC", "IRON", "RETICCTPCT" in the last 72 hours. Urine analysis:    Component Value Date/Time   COLORURINE YELLOW (A) 12/03/2023 1200   APPEARANCEUR CLEAR (A) 12/03/2023 1200   LABSPEC 1.009 12/03/2023 1200   PHURINE 5.0 12/03/2023 1200   GLUCOSEU 150 (A) 12/03/2023 1200   HGBUR NEGATIVE 12/03/2023 1200   BILIRUBINUR NEGATIVE 12/03/2023 1200   BILIRUBINUR negative 03/31/2018 1452   KETONESUR NEGATIVE 12/03/2023 1200   PROTEINUR NEGATIVE 12/03/2023 1200   UROBILINOGEN 0.2 03/31/2018 1452   NITRITE NEGATIVE 12/03/2023 1200   LEUKOCYTESUR NEGATIVE 12/03/2023 1200    Radiological Exams on Admission: DG Chest Portable 1 View Result Date: 12/19/2023 CLINICAL DATA:  Shortness of breath. EXAM: PORTABLE CHEST 1 VIEW COMPARISON:  12/03/2023. FINDINGS: Low lung volume. There is small-to-moderate right pleural effusion, slightly increased since the prior study. There are probable associated compressive atelectatic changes in the right lung. There is probable trace left layering pleural effusion. There is moderate diffuse pulmonary vascular congestion. No pneumothorax. Stable mildly enlarged cardio-mediastinal silhouette. There is a left sided 3-lead pacemaker. No acute osseous abnormalities. The soft tissues are within normal limits.  IMPRESSION: *Moderate diffuse pulmonary vascular congestion and cardiomegaly, favoring congestive heart failure/pulmonary edema. *Small-to-moderate right pleural effusion, slightly increased since the prior study. Probable trace left layering pleural effusion. *No pneumothorax. Electronically Signed   By: Jules Schick M.D.   On: 12/19/2023 09:11    EKG: Independently reviewed.  A-fib with frequent PVCs, no acute ST changes.  Assessment/Plan Principal Problem:   CHF (congestive heart failure) (HCC) Active Problems:   Acute on chronic combined systolic and diastolic CHF (congestive heart failure) (HCC)   Acute pulmonary edema (HCC)   Acute kidney injury superimposed on stage 4 chronic kidney disease (HCC)  (please populate well all problems here in Problem List. (For example, if patient is on BP meds at home and you resume or decide to hold them, it is a problem that needs to be her. Same for CAD, COPD, HLD and so on)  Acute hypoxic respiratory failure Acute on chronic combined HFrEF and HFpEF decompensation Acute on chronic right-sided CHF decompensation Cor pulmonale with severe right-sided CHF Acute pulmonary edema -ED was able to titrate patient off NRB, and now patient is stable on nasal cannula -Plan to continue high-dose of IV Lasix 60 mg twice daily.  Given there is a worsening of uremia, I also consult Dr. Lourdes Sledge to see the patient to guide [place] with IV diuresis -Echocardiogram was done 3 months ago, will not repeat at this time. -Long-term prognosis extremely poor with signs of advanced CHF and worsening of CKD as well as cirrhosis, qualifying him for multiorgan failure.  Discussed with patient regarding prognosis, patient however prefers full code at this point.  Patient and wife agreed with palliative care consultation.  Life expectancy less than 6 months  as my evaluation.  Acute on chronic cirrhosis decompensation -With worsening of fluid overload and buildup of ascites -Will give  albumin x 3 to boost diuresis -Ordered IR guided paracentesis to relieve symptoms, given the clear etiology, and low suspicion for SBP, will perform only therapeutic paracentesis  AKI on CKD stage IV -Significant fluid overload, suspect cardiorenal versus hepatorenal syndrome on top of overall worsening of CKD.  Prognosis poor.  Patient understands that he is not a candidate for kidney transplant or HD given his baseline CHF and cirrhosis. -Cholestyramine and as needed Benadryl for itchiness  Multiorgan failure -As above  PAF on chronic anticoagulation Frequent epistaxis -Discussed with patient regarding benefits and risk of Eliquis: Estimated risks appear to outweigh benefits as patient has increased uremia and platelet dysfunction as well as decompensated cirrhosis with chronic borderline coagulopathy INR= 1.4 and now he started develop intermittent nosebleed.  Hold off Eliquis until at least nosebleeding improves. -Change to chemical DVT prophylaxis, if patient does not tolerate such as increasing nosebleed, consider to discontinue chemical DVT prophylaxis.    DVT prophylaxis: Heparin subcu Code Status: Full code Family Communication: Wife at bedside Disposition Plan: Patient is sick with significant CHF decompensation with pulmonary edema requiring IV diuresis and worsening kidney function requiring close monitoring kidney function inpatient, expect more than 2 midnight hospital stay Consults called: Nephrology Admission status: Telemetry admission   Emeline General MD [facility] Pager [phone]  12/19/2023, 12:45 PM

## 2023-12-19 NOTE — Progress Notes (Signed)
Heart Failure Navigator Progress Note  Assessed for Heart & Vascular TOC clinic readiness.  Patient does not meet criteria due to current [facility] patient of Dr. Marcina Millard.   Navigator will sign off at this time.  Roxy Horseman, RN, BSN [facility] Heart Failure Navigator Secure Chat Only

## 2023-12-19 NOTE — ED Notes (Signed)
Spo2 monitor changed on finger

## 2023-12-19 NOTE — ED Notes (Signed)
Pt lunch warmed up per request and set up to eat

## 2023-12-20 ENCOUNTER — Inpatient Hospital Stay: Payer: Medicare Other

## 2023-12-20 DIAGNOSIS — I5043 Acute on chronic combined systolic (congestive) and diastolic (congestive) heart failure: Secondary | ICD-10-CM | POA: Diagnosis not present

## 2023-12-20 DIAGNOSIS — N179 Acute kidney failure, unspecified: Secondary | ICD-10-CM | POA: Diagnosis not present

## 2023-12-20 DIAGNOSIS — J810 Acute pulmonary edema: Secondary | ICD-10-CM | POA: Diagnosis not present

## 2023-12-20 DIAGNOSIS — N184 Chronic kidney disease, stage 4 (severe): Secondary | ICD-10-CM

## 2023-12-20 DIAGNOSIS — Z515 Encounter for palliative care: Secondary | ICD-10-CM

## 2023-12-20 DIAGNOSIS — R188 Other ascites: Secondary | ICD-10-CM | POA: Diagnosis not present

## 2023-12-20 LAB — BASIC METABOLIC PANEL
Anion gap: 13 (ref 5–15)
BUN: 81 mg/dL — ABNORMAL HIGH (ref 8–23)
CO2: 25 mmol/L (ref 22–32)
Calcium: 8.6 mg/dL — ABNORMAL LOW (ref 8.9–10.3)
Chloride: 94 mmol/L — ABNORMAL LOW (ref 98–111)
Creatinine, Ser: 2.91 mg/dL — ABNORMAL HIGH (ref 0.61–1.24)
GFR, Estimated: 21 mL/min — ABNORMAL LOW (ref >60)
Glucose, Bld: 126 mg/dL — ABNORMAL HIGH (ref 70–99)
Potassium: 3.9 mmol/L (ref 3.5–5.1)
Sodium: 132 mmol/L — ABNORMAL LOW (ref 135–145)

## 2023-12-20 LAB — CBC
HCT: 39.7 % (ref 39.0–52.0)
Hemoglobin: 12.1 g/dL — ABNORMAL LOW (ref 13.0–17.0)
MCH: 29.4 pg (ref 26.0–34.0)
MCHC: 30.5 g/dL (ref 30.0–36.0)
MCV: 96.4 fL (ref 80.0–100.0)
Platelets: 79 10*3/uL — ABNORMAL LOW (ref 150–400)
RBC: 4.12 MIL/uL — ABNORMAL LOW (ref 4.22–5.81)
RDW: 18.8 % — ABNORMAL HIGH (ref 11.5–15.5)
WBC: 5.2 10*3/uL (ref 4.0–10.5)
nRBC: 1 % — ABNORMAL HIGH (ref 0.0–0.2)

## 2023-12-20 LAB — HEPATIC FUNCTION PANEL
ALT: 17 U/L (ref 0–44)
AST: 40 U/L (ref 15–41)
Albumin: 2.8 g/dL — ABNORMAL LOW (ref 3.5–5.0)
Alkaline Phosphatase: 138 U/L — ABNORMAL HIGH (ref 38–126)
Bilirubin, Direct: 0.9 mg/dL — ABNORMAL HIGH (ref 0.0–0.2)
Indirect Bilirubin: 1.2 mg/dL — ABNORMAL HIGH (ref 0.3–0.9)
Total Bilirubin: 2.1 mg/dL — ABNORMAL HIGH (ref 0.0–1.2)
Total Protein: 6.6 g/dL (ref 6.5–8.1)

## 2023-12-20 LAB — GLUCOSE, CAPILLARY: Glucose-Capillary: 148 mg/dL — ABNORMAL HIGH (ref 70–99)

## 2023-12-20 LAB — PROTIME-INR
INR: 1.5 — ABNORMAL HIGH (ref 0.8–1.2)
Prothrombin Time: 18.3 s — ABNORMAL HIGH (ref 11.4–15.2)

## 2023-12-20 LAB — CBG MONITORING, ED
Glucose-Capillary: 116 mg/dL — ABNORMAL HIGH (ref 70–99)
Glucose-Capillary: 162 mg/dL — ABNORMAL HIGH (ref 70–99)

## 2023-12-20 MED ORDER — OXYCODONE-ACETAMINOPHEN 5-325 MG PO TABS
1.0000 | ORAL_TABLET | Freq: Four times a day (QID) | ORAL | Status: DC | PRN
  Administered 2023-12-20 – 2023-12-22 (×3): 1 via ORAL
  Filled 2023-12-20 (×3): qty 1

## 2023-12-20 MED ORDER — MORPHINE SULFATE (PF) 2 MG/ML IV SOLN: 1.0000 mg | INTRAVENOUS | Status: DC | PRN

## 2023-12-20 NOTE — ED Notes (Signed)
OT in room at this time.

## 2023-12-20 NOTE — Progress Notes (Signed)
CBG was 162mg /dl, waiting for the result to send it the chart.

## 2023-12-20 NOTE — Progress Notes (Signed)
PT Cancellation Note  Patient Details Name: Taylor [name] MRN: [id number] DOB: Nov 24, 1941   Cancelled Treatment:    Reason Eval/Treat Not Completed: Medical issues which prohibited therapy Chart reviewed, entered room and medical staff reporting that pt is struggling to catch his breath, breathing treatment ordered, not appropriate for PT at this time.  Will maintain on caseload and try back as pt is appropriate.    Malachi Pro, DPT 12/20/2023, 10:18 AM

## 2023-12-20 NOTE — Evaluation (Signed)
Physical Therapy Evaluation Patient Details Name: Taylor [name] MRN: [id number] DOB: July 07, 1941 Today's Date: 12/20/2023  History of Present Illness  83 y.o. male with medical history significant of chronic combined HFrEF and HFpEF with LVEF less than 20% status post AICD and PPM, CKD stage IV, cirrhosis, with chronic ascites, IIDM, HTN, HLD, GERD, CVA, PAF on Eliquis, presented with worsening of shortness of breath and abdominal distention.  Clinical Impression  Pt pleasant and eager to work with PT. He was able to get up to sitting, standing w/o assist.  Managed independent pericare after BM and able to wash hands w/o support.  Pt was able to maintain consistent cadence with walker around the nurses' station w/o fatigue or DOE.  Pt is weaker than his baseline and will need continued PT but actually did quite well and exceeded his (and wife's) expectations.          If plan is discharge home, recommend the following: A little help with walking and/or transfers;Assistance with cooking/housework;Help with stairs or ramp for entrance;Assist for transportation;Direct supervision/assist for financial management   Can travel by private vehicle        Equipment Recommendations None recommended by PT  Recommendations for Other Services       Functional Status Assessment Patient has had a recent decline in their functional status and demonstrates the ability to make significant improvements in function in a reasonable and predictable amount of time.     Precautions / Restrictions Precautions Precautions: Fall Restrictions Weight Bearing Restrictions Per Provider Order: No      Mobility  Bed Mobility Overal bed mobility: Modified Independent Bed Mobility: Supine to Sit, Sit to Supine     Supine to sit: Supervision Sit to supine: Supervision        Transfers Overall transfer level: Needs assistance Equipment used: Rolling walker (2 wheels) Transfers: Sit to/from Stand Sit to  Stand: Contact guard assist, From elevated surface           General transfer comment: able to rise multiple times from standard height bed w/o assist, good eccentric control to return to sitting    Ambulation/Gait Ambulation/Gait assistance: Contact guard assist, Min assist Gait Distance (Feet): 300 Feet Assistive device: Rolling walker (2 wheels)         General Gait Details: Pt was able to ambulate with walker with good confidence and consistent cadence.  Could not get SpO2 reading but on 2L no SOB or DOE  Acupuncturist Bed    Modified Rankin (Stroke Patients Only)       Balance Overall balance assessment: Needs assistance Sitting-balance support: Feet supported, No upper extremity supported Sitting balance-Leahy Scale: Good     Standing balance support: Bilateral upper extremity supported Standing balance-Leahy Scale: Good Standing balance comment: was able to statically stand and lift RW over obstacle, washed hands at sink w/o UE support                             Pertinent Vitals/Pain Pain Assessment Pain Assessment: No/denies pain    Home Living Family/patient expects to be discharged to:: Private residence Living Arrangements: Spouse/significant other Available Help at Discharge: Family;Available 24 hours/day Type of Home: House Home Access: Stairs to enter Entrance Stairs-Rails: Can reach both Entrance Stairs-Number of Steps: 3 @ front; 2 in garage without rails   Home Layout:  One level Home Equipment: Agricultural consultant (2 wheels);Adaptive equipment;Shower seat      Prior Function Prior Level of Function : History of Falls (last six months)             Mobility Comments: RW all the time, 2 recent falls ADLs Comments: Ind with ADLs (sock aide to don socks)     Extremity/Trunk Assessment   Upper Extremity Assessment Upper Extremity Assessment: Overall WFL for tasks assessed    Lower  Extremity Assessment Lower Extremity Assessment: Overall WFL for tasks assessed       Communication   Communication Communication: No apparent difficulties  Cognition Arousal: Alert Behavior During Therapy: WFL for tasks assessed/performed Overall Cognitive Status: Within Functional Limits for tasks assessed                                          General Comments General comments (skin integrity, edema, etc.): Pt feeling much better than on arrival, but still endorses feeling weak    Exercises     Assessment/Plan    PT Assessment Patient needs continued PT services  PT Problem List Decreased strength;Decreased activity tolerance;Decreased balance;Decreased mobility;Decreased safety awareness       PT Treatment Interventions Patient/family education;DME instruction;Gait training;Stair training;Functional mobility training;Therapeutic activities;Therapeutic exercise;Balance training    PT Goals (Current goals can be found in the Care Plan section)  Acute Rehab PT Goals Patient Stated Goal: return to home, regain strength, be able to start cardiac rehab soon as scheduled PT Goal Formulation: With patient Time For Goal Achievement: 01/02/24 Potential to Achieve Goals: Good    Frequency Min 1X/week     Co-evaluation               AM-PAC PT "6 Clicks" Mobility  Outcome Measure Help needed turning from your back to your side while in a flat bed without using bedrails?: None Help needed moving from lying on your back to sitting on the side of a flat bed without using bedrails?: None Help needed moving to and from a bed to a chair (including a wheelchair)?: None Help needed standing up from a chair using your arms (e.g., wheelchair or bedside chair)?: None Help needed to walk in hospital room?: None Help needed climbing 3-5 steps with a railing? : A Little 6 Click Score: 23    End of Session Equipment Utilized During Treatment: Oxygen;Gait  belt Activity Tolerance: Patient tolerated treatment well;Patient limited by lethargy Patient left: in bed;with call bell/phone within reach Nurse Communication: Mobility status PT Visit Diagnosis: Unsteadiness on feet (R26.81);Other abnormalities of gait and mobility (R26.89)    Time: 1610-9604 PT Time Calculation (min) (ACUTE ONLY): 32 min   Charges:   PT Evaluation $PT Eval Low Complexity: 1 Low PT Treatments $Gait Training: 8-22 mins PT General Charges $$ ACUTE PT VISIT: 1 Visit         Malachi Pro, DPT 12/20/2023, 7:32 PM

## 2023-12-20 NOTE — Progress Notes (Addendum)
PROGRESS NOTE    Taylor [name]  IRJ:[id number] DOB: Jun 21, 1941 DOA: 12/19/2023 PCP: Malva Limes, MD   Assessment & Plan:   Principal Problem:   CHF (congestive heart failure) (HCC) Active Problems:   Acute on chronic combined systolic and diastolic CHF (congestive heart failure) (HCC)   Acute pulmonary edema (HCC)   Acute kidney injury superimposed on stage 4 chronic kidney disease (HCC)  Assessment and Plan: Acute hypoxic respiratory failure: secondary acute on chronic combined CHF & acute pulmonary edema.  Acute on chronic combined CHF exacerbation: continue on IV lasix. Monitor I/Os.    Cirrhosis decompensated: s/p therapeutic paracentesis & albumin given. Low suspicion for SBP. Not on lactulose or rifaximin as per home med rec    AKI on CKDIV: Cr is labile. Avoid nephrotoxic meds    Multiorgan failure: as stated above. Management as stated above. Prognosis is poor   PAF: on chronic anticoagulation but not on any rate controlling meds though. Has frequent epistaxis. Will hold eliquis  Hyponatremia: likely secondary to cirrhosis. Will continue to monitor      DVT prophylaxis: SCDs Code Status: full  Family Communication:  Disposition Plan: depends on PT/OT recs   Level of care: Telemetry Cardiac  Status is: Inpatient Remains inpatient appropriate because: severity of illness    Consultants:  Palliative care Nephro   Procedures:   Antimicrobials:    Subjective: Pt c/o malaise   Objective: Vitals:   12/20/23 0421 12/20/23 0500 12/20/23 0600 12/20/23 0700  BP: 117/69 (!) 119/99 (!) 111/100 (!) 104/93  Pulse: 73     Resp: 20   20  Temp: 97.6 F (36.4 C)     TempSrc:      SpO2: 100% 100% 100% 92%  Weight:      Height:        Intake/Output Summary (Last 24 hours) at 12/20/2023 0847 Last data filed at 12/20/2023 0624 Gross per 24 hour  Intake 142.31 ml  Output --  Net 142.31 ml   Filed Weights   12/19/23 0836  Weight: 74.8 kg     Examination:  General exam: Appears calm and comfortable  Respiratory system: decreased breath sounds b/l  Cardiovascular system: S1 & S2+. No  rubs, gallops or clicks.  Gastrointestinal system: Abdomen is nondistended, soft and nontender. Normal bowel sounds heard. Central nervous system: Alert and oriented. Moves all extremities  Psychiatry: Judgement and insight appear normal. Mood & affect appropriate.     Data Reviewed: I have personally reviewed following labs and imaging studies  CBC: Recent Labs  Lab 12/19/23 0841 12/20/23 0511  WBC 6.7 5.2  HGB 12.1* 12.1*  HCT 40.6 39.7  MCV 96.9 96.4  PLT 111* 79*   Basic Metabolic Panel: Recent Labs  Lab 12/19/23 0841 12/20/23 0511  NA 135 132*  K 3.2* 3.9  CL 96* 94*  CO2 23 25  GLUCOSE 143* 126*  BUN 79* 81*  CREATININE 2.66* 2.91*  CALCIUM 8.3* 8.6*   GFR: Estimated Creatinine Clearance: 20.6 mL/min (A) (by C-G formula based on SCr of 2.91 mg/dL (H)). Liver Function Tests: Recent Labs  Lab 12/19/23 0841 12/20/23 0511  AST 47* 40  ALT 18 17  ALKPHOS 150* 138*  BILITOT 1.9* 2.1*  PROT 6.9 6.6  ALBUMIN 2.5* 2.8*   No results for input(s): "LIPASE", "AMYLASE" in the last 168 hours. No results for input(s): "AMMONIA" in the last 168 hours. Coagulation Profile: Recent Labs  Lab 12/20/23 0511  INR 1.5*   Cardiac  Enzymes: No results for input(s): "CKTOTAL", "CKMB", "CKMBINDEX", "TROPONINI" in the last 168 hours. BNP (last 3 results) No results for input(s): "PROBNP" in the last 8760 hours. HbA1C: No results for input(s): "HGBA1C" in the last 72 hours. CBG: Recent Labs  Lab 12/19/23 1159 12/19/23 1646 12/19/23 2157  GLUCAP 142* 135* 138*   Lipid Profile: No results for input(s): "CHOL", "HDL", "LDLCALC", "TRIG", "CHOLHDL", "LDLDIRECT" in the last 72 hours. Thyroid Function Tests: No results for input(s): "TSH", "T4TOTAL", "FREET4", "T3FREE", "THYROIDAB" in the last 72 hours. Anemia Panel: No  results for input(s): "VITAMINB12", "FOLATE", "FERRITIN", "TIBC", "IRON", "RETICCTPCT" in the last 72 hours. Sepsis Labs: No results for input(s): "PROCALCITON", "LATICACIDVEN" in the last 168 hours.  Recent Results (from the past 240 hours)  Resp panel by RT-PCR (RSV, Flu A&B, Covid) Anterior Nasal Swab     Status: None   Collection Time: 12/19/23  8:54 AM   Specimen: Anterior Nasal Swab  Result Value Ref Range Status   SARS Coronavirus 2 by RT PCR NEGATIVE NEGATIVE Final    Comment: (NOTE) SARS-CoV-2 target nucleic acids are NOT DETECTED.  The SARS-CoV-2 RNA is generally detectable in upper respiratory specimens during the acute phase of infection. The lowest concentration of SARS-CoV-2 viral copies this assay can detect is 138 copies/mL. A negative result does not preclude SARS-Cov-2 infection and should not be used as the sole basis for treatment or other patient management decisions. A negative result may occur with  improper specimen collection/handling, submission of specimen other than nasopharyngeal swab, presence of viral mutation(s) within the areas targeted by this assay, and inadequate number of viral copies(<138 copies/mL). A negative result must be combined with clinical observations, patient history, and epidemiological information. The expected result is Negative.  Fact Sheet for Patients:  BloggerCourse.com  Fact Sheet for Healthcare Providers:  SeriousBroker.it  This test is no t yet approved or cleared by the [place] FDA and  has been authorized for detection and/or diagnosis of SARS-CoV-2 by FDA under an Emergency Use Authorization (EUA). This EUA will remain  in effect (meaning this test can be used) for the duration of the COVID-19 declaration under Section 564(b)(1) of the Act, 21 U.S.C.section 360bbb-3(b)(1), unless the authorization is terminated  or revoked sooner.       Influenza A by PCR  NEGATIVE NEGATIVE Final   Influenza B by PCR NEGATIVE NEGATIVE Final    Comment: (NOTE) The Xpert Xpress SARS-CoV-2/FLU/RSV plus assay is intended as an aid in the diagnosis of influenza from Nasopharyngeal swab specimens and should not be used as a sole basis for treatment. Nasal washings and aspirates are unacceptable for Xpert Xpress SARS-CoV-2/FLU/RSV testing.  Fact Sheet for Patients: BloggerCourse.com  Fact Sheet for Healthcare Providers: SeriousBroker.it  This test is not yet approved or cleared by the [place] FDA and has been authorized for detection and/or diagnosis of SARS-CoV-2 by FDA under an Emergency Use Authorization (EUA). This EUA will remain in effect (meaning this test can be used) for the duration of the COVID-19 declaration under Section 564(b)(1) of the Act, 21 U.S.C. section 360bbb-3(b)(1), unless the authorization is terminated or revoked.     Resp Syncytial Virus by PCR NEGATIVE NEGATIVE Final    Comment: (NOTE) Fact Sheet for Patients: BloggerCourse.com  Fact Sheet for Healthcare Providers: SeriousBroker.it  This test is not yet approved or cleared by the [place] FDA and has been authorized for detection and/or diagnosis of SARS-CoV-2 by FDA under an Emergency Use Authorization (EUA). This  EUA will remain in effect (meaning this test can be used) for the duration of the COVID-19 declaration under Section 564(b)(1) of the Act, 21 U.S.C. section 360bbb-3(b)(1), unless the authorization is terminated or revoked.  Performed at [facility], [address], [place], [place] [address]          Radiology Studies: DG Chest 1 View Result Date: 12/20/2023 CLINICAL DATA:  CHF. EXAM: CHEST  1 VIEW COMPARISON:  12/19/2023 FINDINGS: The cardio pericardial silhouette is enlarged. Vascular congestion with superimposed interstitial opacity  suggesting edema. Right base collapse/consolidation with effusion is similar to prior. Bones are diffusely demineralized. Telemetry leads overlie the chest. IMPRESSION: 1. Vascular congestion with superimposed interstitial opacity suggesting edema. 2. Right base collapse/consolidation with effusion, similar to prior. Electronically Signed   By: Kennith Center M.D.   On: 12/20/2023 08:14   US Paracentesis Result Date: 12/19/2023 INDICATION: Patient is a 83 y/o male with history of heart failure, CKD and cirrhosis. Patient presents for a paracentesis due to recurrent ascites. EXAM: ULTRASOUND GUIDED THERAPEUTIC PARACENTESIS MEDICATIONS: 10mL lidocaine 1% COMPLICATIONS: None immediate. PROCEDURE: Informed written consent was obtained from the patient after a discussion of the risks, benefits and alternatives to treatment. A timeout was performed prior to the initiation of the procedure. Initial ultrasound scanning demonstrates a large amount of ascites within the right upper abdominal quadrant. The right upper abdomen was prepped and draped in the usual sterile fashion. 1% lidocaine was used for local anesthesia. Following this, a 6 Fr Safe-T-Centesis catheter was introduced. An ultrasound image was saved for documentation purposes. The paracentesis was performed. The catheter was removed and a dressing was applied. The patient tolerated the procedure well without immediate post procedural complication. Patient received post-procedure intravenous albumin; see nursing notes for details. FINDINGS: A total of approximately 6700 mL of yellow fluid was removed. IMPRESSION: Successful ultrasound-guided paracentesis yielding 6700 mL of peritoneal fluid. Performed by Philipp Ovens PA-C PLAN: If the patient eventually requires >/=2 paracenteses in a 30 day period, candidacy for formal evaluation by the [facility] Rad[facility] will be assessed. Roanna Banning, MD [facility] Radiology Electronically Signed   By: Roanna Banning M.D.   On: 12/19/2023 15:29   DG Chest Portable 1 View Result Date: 12/19/2023 CLINICAL DATA:  Shortness of breath. EXAM: PORTABLE CHEST 1 VIEW COMPARISON:  12/03/2023. FINDINGS: Low lung volume. There is small-to-moderate right pleural effusion, slightly increased since the prior study. There are probable associated compressive atelectatic changes in the right lung. There is probable trace left layering pleural effusion. There is moderate diffuse pulmonary vascular congestion. No pneumothorax. Stable mildly enlarged cardio-mediastinal silhouette. There is a left sided 3-lead pacemaker. No acute osseous abnormalities. The soft tissues are within normal limits. IMPRESSION: *Moderate diffuse pulmonary vascular congestion and cardiomegaly, favoring congestive heart failure/pulmonary edema. *Small-to-moderate right pleural effusion, slightly increased since the prior study. Probable trace left layering pleural effusion. *No pneumothorax. Electronically Signed   By: Jules Schick M.D.   On: 12/19/2023 09:11        Scheduled Meds:  atorvastatin  40 mg Oral Daily   cholestyramine  4 g Oral BID   fluticasone  2 spray Each Nare Daily   furosemide  60 mg Intravenous BID   insulin aspart  0-9 Units Subcutaneous TID WC   magnesium oxide  200 mg Oral BID   pantoprazole  40 mg Oral Daily   potassium chloride SA  10 mEq Oral BID  sodium chloride flush  3 mL Intravenous Q12H   Continuous Infusions:  sodium chloride     albumin human Stopped (12/20/23 0624)     LOS: 1 day      Charise Killian, MD [facility] Pager [phone]  If 7PM-7AM, please contact night-coverage [email or web address] 12/20/2023, 8:47 AM

## 2023-12-20 NOTE — Plan of Care (Signed)
  Problem: Education: Goal: Ability to describe self-care measures that may prevent or decrease complications (Diabetes Survival Skills Education) will improve Outcome: Progressing   Problem: Coping: Goal: Ability to adjust to condition or change in health will improve Outcome: Progressing   Problem: Fluid Volume: Goal: Ability to maintain a balanced intake and output will improve Outcome: Progressing   Problem: Metabolic: Goal: Ability to maintain appropriate glucose levels will improve Outcome: Progressing   Problem: Nutritional: Goal: Maintenance of adequate nutrition will improve Outcome: Progressing   Problem: Skin Integrity: Goal: Risk for impaired skin integrity will decrease Outcome: Progressing   Problem: Tissue Perfusion: Goal: Adequacy of tissue perfusion will improve Outcome: Progressing   Problem: Clinical Measurements: Goal: Diagnostic test results will improve Outcome: Progressing Goal: Respiratory complications will improve Outcome: Progressing Goal: Cardiovascular complication will be avoided Outcome: Progressing

## 2023-12-20 NOTE — Consult Note (Signed)
Consultation Note Date: 12/20/2023   Patient Name: Taylor [name]  DOB: 1941-10-26  MRN: [id number]  Age / Sex: 83 y.o., male  PCP: Malva Limes, MD Referring Physician: Charise Killian, MD  Reason for Consultation: Establishing goals of care   HPI/Brief Hospital Course: 83 y.o. male  with past medical history of COPD with chronic respiratory failure on 2L [place] at baseline, chronic combined HFrEF with EF less than 20%, CAD s/p CABG and AICD with PPM placement, cirrhosis with ascites, CKD stage IV, PAF on Eliquis, CVA, HTN, HLD admitted from home on 12/19/2023 with [facility] and abdominal distention.  Reportedly at home attempted to increase dose of Torsemide from 20 to 60 mg daily due to fluid retention and SHOB without improvement in symptoms.  Admitted and being treated for acute on chronic respiratory failure with hypoxia, acute exacerbation of chronic CHF, acute pulmonary edema, AKI superimposed on CKD, acute on chronic decompensated cirrhosis  Underwent paracentesis 1/24, 6.7 liters of fluid removed  Palliative medicine was consulted for assisting with goals of care conversations with concern for poor prognosis related to multiorgan failure.  Subjective:  Extensive chart review has been completed prior to meeting patient including labs, vital signs, imaging, progress notes, orders, and available advanced directive documents from current and previous encounters.  Visited with Taylor [name] at his bedside. He is awake, alert, able to answer orientation questions, some moments of intermittent confusion. On arrival to room, Taylor [name] with audible wheeze and constant dry hacking cough. Nursing staff notified, administered Mucinex and albuterol breathing treatment, symptoms much improved. Wife at bedside during time of visit.  Introduced myself as a Publishing rights manager as a member of the palliative care team. Explained palliative medicine is specialized medical care  for people living with serious illness. It focuses on providing relief from the symptoms and stress of a serious illness. The goal is to improve quality of life for both the patient and the family.   Taylor [name] shares a brief life review. He has been married to Taylor [name] for over 50 years, they share 2 sons that live local and are involved in his care. 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 He enjoys skating as a hobby, has competed in Architect young skaters. He was able to compete in a skate competition just a few years prior. Being active, skating and working in his work shop are important to him.  Taylor [name] shares the hospital team has discussed his poor prognosis. He is appropriately upset and tearful.  Lengthy discussion had with Taylor [name] and Taylor [name] about chronic disease trajectory as it is related to his underlying COPD, CKD, HFrEF and now decompensated cirrhosis. We discussed treatment and management options being limited at this time due to end stage multiorgan failure. Expectations and EOL were discussed. We also discussed poor prognosis.  The difference between aggressive medical intervention and comfort care was discussed.  We also discussed the overall philosophy of hospice, services offered and ability of hospice services being provided in the home, LTC and IPU pending eligibility.  We also discussed code status and the difference between Full Code and Do Not Resuscitate. Encouraged patient/family to consider DNR/DNI status understanding evidenced based poor outcomes in similar hospitalized patients, as the cause of the arrest is likely associated with chronic/terminal disease rather than a reversible acute cardio-pulmonary event.    Encouraged Taylor [name] to have conversations with his wife  and sons, he voices feeling overwhelmed and struggling with processing all information shared with him  today.  Received call later in day from wife, requesting meeting with sons outside of bedside. Met with Taylor [name], 2 sons and daughter in law. All conversations had as noted above reviewed with family including chronic disease trajectory, poor prognosis, comfort care and hospice services.  Family has requested meeting with hospice liaison. Son previously worked at hospital as Orthoptist and is familiar with [facility]. Requests meeting with [facility]. Engaged HL-meeting set for tomorrow.  Family also wanting to discuss drain placement due to recurrent ascites for palliative purposes. Discussed with primary team.  I discussed importance of continued conversations with family/support persons and all members of their medical team regarding overall plan of care and treatment options ensuring decisions are in alignment with patients goals of care.  All questions/concerns addressed. Emotional support provided to patient/family/support persons. PMT will continue to follow and support patient as needed.  Objective: Primary Diagnoses: Present on Admission:  Acute on chronic combined systolic and diastolic CHF (congestive heart failure) (HCC)  Acute kidney injury superimposed on stage 4 chronic kidney disease (HCC)  Acute pulmonary edema (HCC)   Physical Exam Constitutional:      General: He is not in acute distress.    Appearance: He is ill-appearing.  Pulmonary:     Effort: Pulmonary effort is normal. No respiratory distress.  Skin:    General: Skin is warm and dry.  Neurological:     Mental Status: He is alert and oriented to person, place, and time.     Motor: Weakness present.     Vital Signs: BP (!) 121/99 (BP Location: Right Arm)   Pulse 84   Temp 97.6 F (36.4 C) (Oral)   Resp 20   Ht 5\' 10"  (1.778 m)   Wt 71.9 kg   SpO2 98%   BMI 22.76 kg/m  Pain Scale: 0-10   Pain Score: 8   IO: Intake/output summary:  Intake/Output Summary (Last 24 hours) at 12/20/2023 1738 Last data filed at  12/20/2023 1556 Gross per 24 hour  Intake 1102.31 ml  Output 450 ml  Net 652.31 ml    LBM: Last BM Date : 12/19/23 Baseline Weight: Weight: 74.8 kg Most recent weight: Weight: 71.9 kg       Assessment and Plan  SUMMARY OF RECOMMENDATIONS   Time for family discussions Possible drain placement for palliative paracentesis if appropriate Meeting set with HL 1/26 PMT to continue to follow for ongoing needs and support  Discussed With: Primary team and nursing staff   Thank you for this consult and allowing Palliative Medicine to participate in the care of [name]. Palliative medicine will continue to follow and assist as needed.   Time Total: 90 minutes  Time spent includes: Detailed review of medical records (labs, imaging, vital signs), medically appropriate exam (mental status, respiratory, cardiac, skin), discussed with treatment team, counseling and educating patient, family and staff, documenting clinical information, medication management and coordination of care.   Signed by: Leeanne Deed, DNP, AGNP-C Palliative Medicine    Please contact Palliative Medicine Team phone at [phone] for questions and concerns.  For individual provider: See Loretha Stapler

## 2023-12-20 NOTE — ED Notes (Signed)
Requested Delsym from pharmacy.  Per pharmacy, Delsym not available.

## 2023-12-20 NOTE — Evaluation (Signed)
Occupational Therapy Evaluation Patient Details Name: Taylor [name] MRN: [id number] DOB: 05-Mar-1941 Today's Date: 12/20/2023   History of Present Illness 83 y.o. male with medical history significant of chronic combined HFrEF and HFpEF with LVEF less than 20% status post AICD and PPM, CKD stage IV, cirrhosis, with chronic ascites, IIDM, HTN, HLD, GERD, CVA, PAF on Eliquis, presented with worsening of shortness of breath and abdominal distention.   Clinical Impression   Pt was seen for OT evaluation this date. Prior to hospital admission, pt was ambulating with a 2WW and generally mod indep with ADL. Pt lives with his spouse in 1 story home. Pt presents to acute OT demonstrating impaired ADL performance and functional mobility 2/2 decreased strength, balance, and activity tolerance (See OT problem list for additional functional deficits). Pt endorsing mild SOB during limited exertional activity. PT notes his spouse would be bringing his shoes very soon. Pt currently requires supv for bed mobility, CGA and handheld assist for standing and taking a few steps. MIN A for LB dressing. Pt would benefit from skilled OT services to address noted impairments and functional limitations (see below for any additional details) in order to maximize safety and independence while minimizing falls risk and caregiver burden.     If plan is discharge home, recommend the following: A little help with walking and/or transfers;A little help with bathing/dressing/bathroom;Assistance with cooking/housework;Assist for transportation;Help with stairs or ramp for entrance    Functional Status Assessment  Patient has had a recent decline in their functional status and demonstrates the ability to make significant improvements in function in a reasonable and predictable amount of time.  Equipment Recommendations  None recommended by OT    Recommendations for Other Services       Precautions / Restrictions         Mobility Bed Mobility Overal bed mobility: Needs Assistance Bed Mobility: Supine to Sit, Sit to Supine     Supine to sit: Supervision Sit to supine: Supervision        Transfers Overall transfer level: Needs assistance Equipment used: 1 person hand held assist Transfers: Sit to/from Stand Sit to Stand: Contact guard assist, From elevated surface           General transfer comment: from ED gurney      Balance Overall balance assessment: Needs assistance Sitting-balance support: Feet supported, No upper extremity supported Sitting balance-Leahy Scale: Good     Standing balance support: Bilateral upper extremity supported Standing balance-Leahy Scale: Poor                             ADL either performed or assessed with clinical judgement   ADL Overall ADL's : Needs assistance/impaired                     Lower Body Dressing: Sit to/from stand;Minimal assistance Lower Body Dressing Details (indicate cue type and reason): difficulty reaching feet                     Vision         Perception         Praxis         Pertinent Vitals/Pain Pain Assessment Pain Assessment: No/denies pain     Extremity/Trunk Assessment Upper Extremity Assessment Upper Extremity Assessment: Overall WFL for tasks assessed   Lower Extremity Assessment Lower Extremity Assessment: Generalized weakness   Cervical / Trunk Assessment Cervical /  Trunk Assessment: Normal   Communication     Cognition Arousal: Alert Behavior During Therapy: WFL for tasks assessed/performed Overall Cognitive Status: No family/caregiver present to determine baseline cognitive functioning Area of Impairment: Safety/judgement                         Safety/Judgement: Decreased awareness of deficits, Decreased awareness of safety     General Comments: mildly inappropriate     General Comments  endorses mild SOB, difficult to get SpO2 read despite  various attempts and methods    Exercises     Shoulder Instructions      Home Living                                          Prior Functioning/Environment                          OT Problem List: Decreased strength;Decreased activity tolerance;Decreased knowledge of use of DME or AE;Decreased safety awareness;Cardiopulmonary status limiting activity;Impaired balance (sitting and/or standing)      OT Treatment/Interventions: Self-care/ADL training;DME and/or AE instruction;Therapeutic activities;Therapeutic exercise;Patient/family education;Energy conservation    OT Goals(Current goals can be found in the care plan section) Acute Rehab OT Goals Patient Stated Goal: go home OT Goal Formulation: With patient Time For Goal Achievement: 01/03/24 Potential to Achieve Goals: Good ADL Goals Pt Will Perform Lower Body Dressing: with modified independence;sitting/lateral leans Pt Will Transfer to Toilet: with modified independence;ambulating (LRAD) Pt Will Perform Toileting - Clothing Manipulation and hygiene: with modified independence Additional ADL Goal #1: Pt will independently utilize at least 1 learned ECS into daily ADL/mobility routines to improve safety and minimize SOB, 3/3 opportunities.  OT Frequency: Min 1X/week    Co-evaluation              AM-PAC OT "6 Clicks" Daily Activity     Outcome Measure Help from another person eating meals?: None Help from another person taking care of personal grooming?: None Help from another person toileting, which includes using toliet, bedpan, or urinal?: A Little Help from another person bathing (including washing, rinsing, drying)?: A Little Help from another person to put on and taking off regular upper body clothing?: None Help from another person to put on and taking off regular lower body clothing?: A Little 6 Click Score: 21   End of Session Equipment Utilized During Treatment: Oxygen Nurse  Communication: Mobility status  Activity Tolerance: Patient tolerated treatment well Patient left: in bed;with call bell/phone within reach  OT Visit Diagnosis: Other abnormalities of gait and mobility (R26.89);Muscle weakness (generalized) (M62.81)                Time: 1610-9604 OT Time Calculation (min): 28 min Charges:  OT General Charges $OT Visit: 1 Visit OT Evaluation $OT Eval Low Complexity: 1 Low  Arman Filter., MPH, MS, OTR/L ascom [phone] 12/20/23, 1:50 PM

## 2023-12-20 NOTE — Progress Notes (Signed)
[facility]  ROUNDING NOTE   Subjective:   Taylor [name] is a 83 y.o. with medical problems of sCHF with EF < 20%, CKD-4, HTN, HLD, DM, COPD on 2L O2, CAD with stent and hx of CABG, stroke, A fib on Eliquis, s/p of AICD, melanoma, psoriatic arthritis, pancreatitis, liver cirrhosis. Patient presents to ED with increased lower extremity edema and has been admitted for CHF (congestive heart failure) (HCC) [I50.9]  Patient is known to our practice and is followed by Dr Suezanne Jacquet.   Patient seen sitting at end of stretcher Wife at bedside Denies pain  Creatinine 2.91  Objective:  Vital signs in last 24 hours:  Temp:  [97.6 F (36.4 C)-98.4 F (36.9 C)] 97.6 F (36.4 C) (01/25 1241) Pulse Rate:  [73-89] 87 (01/25 1213) Resp:  [14-24] 22 (01/25 1213) BP: (104-151)/(69-108) 121/91 (01/25 1213) SpO2:  [92 %-100 %] 96 % (01/25 1241)  Weight change:  Filed Weights   12/19/23 0836  Weight: 74.8 kg    Intake/Output: I/O last 3 completed shifts: In: 142.3 [IV Piggyback:142.3] Out: -    Intake/Output this shift:  Total I/O In: 960 [P.O.:960] Out: -   Physical Exam: General: NAD,   Head: Normocephalic, atraumatic. Moist oral mucosal membranes  Eyes: Anicteric  Lungs:  Crackles  Heart: Regular rate and rhythm  Abdomen:  Firm, nontender, distention  Extremities:  2+ peripheral edema.  Neurologic: Alert, moving all four extremities  Skin: No lesions       Basic Metabolic Panel: Recent Labs  Lab 12/19/23 0841 12/20/23 0511  NA 135 132*  K 3.2* 3.9  CL 96* 94*  CO2 23 25  GLUCOSE 143* 126*  BUN 79* 81*  CREATININE 2.66* 2.91*  CALCIUM 8.3* 8.6*    Liver Function Tests: Recent Labs  Lab 12/19/23 0841 12/20/23 0511  AST 47* 40  ALT 18 17  ALKPHOS 150* 138*  BILITOT 1.9* 2.1*  PROT 6.9 6.6  ALBUMIN 2.5* 2.8*   No results for input(s): "LIPASE", "AMYLASE" in the last 168 hours. No results for input(s): "AMMONIA" in the last 168  hours.  CBC: Recent Labs  Lab 12/19/23 0841 12/20/23 0511  WBC 6.7 5.2  HGB 12.1* 12.1*  HCT 40.6 39.7  MCV 96.9 96.4  PLT 111* 79*    Cardiac Enzymes: No results for input(s): "CKTOTAL", "CKMB", "CKMBINDEX", "TROPONINI" in the last 168 hours.  BNP: Invalid input(s): "POCBNP"  CBG: Recent Labs  Lab 12/19/23 1159 12/19/23 1646 12/19/23 2157 12/20/23 0908 12/20/23 1208  GLUCAP 142* 135* 138* 116* 162*    Microbiology: Results for orders placed or performed during the hospital encounter of 12/19/23  Resp panel by RT-PCR (RSV, Flu A&B, Covid) Anterior Nasal Swab     Status: None   Collection Time: 12/19/23  8:54 AM   Specimen: Anterior Nasal Swab  Result Value Ref Range Status   SARS Coronavirus 2 by RT PCR NEGATIVE NEGATIVE Final    Comment: (NOTE) SARS-CoV-2 target nucleic acids are NOT DETECTED.  The SARS-CoV-2 RNA is generally detectable in upper respiratory specimens during the acute phase of infection. The lowest concentration of SARS-CoV-2 viral copies this assay can detect is 138 copies/mL. A negative result does not preclude SARS-Cov-2 infection and should not be used as the sole basis for treatment or other patient management decisions. A negative result may occur with  improper specimen collection/handling, submission of specimen other than nasopharyngeal swab, presence of viral mutation(s) within the areas targeted by this assay, and  inadequate number of viral copies(<138 copies/mL). A negative result must be combined with clinical observations, patient history, and epidemiological information. The expected result is Negative.  Fact Sheet for Patients:  BloggerCourse.com  Fact Sheet for Healthcare Providers:  SeriousBroker.it  This test is no t yet approved or cleared by the [place] FDA and  has been authorized for detection and/or diagnosis of SARS-CoV-2 by FDA under an Emergency Use  Authorization (EUA). This EUA will remain  in effect (meaning this test can be used) for the duration of the COVID-19 declaration under Section 564(b)(1) of the Act, 21 U.S.C.section 360bbb-3(b)(1), unless the authorization is terminated  or revoked sooner.       Influenza A by PCR NEGATIVE NEGATIVE Final   Influenza B by PCR NEGATIVE NEGATIVE Final    Comment: (NOTE) The Xpert Xpress SARS-CoV-2/FLU/RSV plus assay is intended as an aid in the diagnosis of influenza from Nasopharyngeal swab specimens and should not be used as a sole basis for treatment. Nasal washings and aspirates are unacceptable for Xpert Xpress SARS-CoV-2/FLU/RSV testing.  Fact Sheet for Patients: BloggerCourse.com  Fact Sheet for Healthcare Providers: SeriousBroker.it  This test is not yet approved or cleared by the [place] FDA and has been authorized for detection and/or diagnosis of SARS-CoV-2 by FDA under an Emergency Use Authorization (EUA). This EUA will remain in effect (meaning this test can be used) for the duration of the COVID-19 declaration under Section 564(b)(1) of the Act, 21 U.S.C. section 360bbb-3(b)(1), unless the authorization is terminated or revoked.     Resp Syncytial Virus by PCR NEGATIVE NEGATIVE Final    Comment: (NOTE) Fact Sheet for Patients: BloggerCourse.com  Fact Sheet for Healthcare Providers: SeriousBroker.it  This test is not yet approved or cleared by the [place] FDA and has been authorized for detection and/or diagnosis of SARS-CoV-2 by FDA under an Emergency Use Authorization (EUA). This EUA will remain in effect (meaning this test can be used) for the duration of the COVID-19 declaration under Section 564(b)(1) of the Act, 21 U.S.C. section 360bbb-3(b)(1), unless the authorization is terminated or revoked.  Performed at [facility], [address] [place], [place] [address]     Coagulation Studies: Recent Labs    12/20/23 0511  LABPROT 18.3*  INR 1.5*    Urinalysis: No results for input(s): "COLORURINE", "LABSPEC", "PHURINE", "GLUCOSEU", "HGBUR", "BILIRUBINUR", "KETONESUR", "PROTEINUR", "UROBILINOGEN", "NITRITE", "LEUKOCYTESUR" in the last 72 hours.  Invalid input(s): "APPERANCEUR"    Imaging: DG Chest 1 View Result Date: 12/20/2023 CLINICAL DATA:  CHF. EXAM: CHEST  1 VIEW COMPARISON:  12/19/2023 FINDINGS: The cardio pericardial silhouette is enlarged. Vascular congestion with superimposed interstitial opacity suggesting edema. Right base collapse/consolidation with effusion is similar to prior. Bones are diffusely demineralized. Telemetry leads overlie the chest. IMPRESSION: 1. Vascular congestion with superimposed interstitial opacity suggesting edema. 2. Right base collapse/consolidation with effusion, similar to prior. Electronically Signed   By: Kennith Center M.D.   On: 12/20/2023 08:14   US Paracentesis Result Date: 12/19/2023 INDICATION: Patient is a 83 y/o male with history of heart failure, CKD and cirrhosis. Patient presents for a paracentesis due to recurrent ascites. EXAM: ULTRASOUND GUIDED THERAPEUTIC PARACENTESIS MEDICATIONS: 10mL lidocaine 1% COMPLICATIONS: None immediate. PROCEDURE: Informed written consent was obtained from the patient after a discussion of the risks, benefits and alternatives to treatment. A timeout was performed prior to the initiation of the procedure. Initial ultrasound scanning demonstrates a large amount of ascites within the right upper abdominal quadrant. The  right upper abdomen was prepped and draped in the usual sterile fashion. 1% lidocaine was used for local anesthesia. Following this, a 6 Fr Safe-T-Centesis catheter was introduced. An ultrasound image was saved for documentation purposes. The paracentesis was performed. The catheter was removed and a dressing was applied. The patient  tolerated the procedure well without immediate post procedural complication. Patient received post-procedure intravenous albumin; see nursing notes for details. FINDINGS: A total of approximately 6700 mL of yellow fluid was removed. IMPRESSION: Successful ultrasound-guided paracentesis yielding 6700 mL of peritoneal fluid. Performed by Philipp Ovens PA-C PLAN: If the patient eventually requires >/=2 paracenteses in a 30 day period, candidacy for formal evaluation by the [facility] will be assessed. Roanna Banning, MD [facility] Electronically Signed   By: Roanna Banning M.D.   On: 12/19/2023 15:29   DG Chest Portable 1 View Result Date: 12/19/2023 CLINICAL DATA:  Shortness of breath. EXAM: PORTABLE CHEST 1 VIEW COMPARISON:  12/03/2023. FINDINGS: Low lung volume. There is small-to-moderate right pleural effusion, slightly increased since the prior study. There are probable associated compressive atelectatic changes in the right lung. There is probable trace left layering pleural effusion. There is moderate diffuse pulmonary vascular congestion. No pneumothorax. Stable mildly enlarged cardio-mediastinal silhouette. There is a left sided 3-lead pacemaker. No acute osseous abnormalities. The soft tissues are within normal limits. IMPRESSION: *Moderate diffuse pulmonary vascular congestion and cardiomegaly, favoring congestive heart failure/pulmonary edema. *Small-to-moderate right pleural effusion, slightly increased since the prior study. Probable trace left layering pleural effusion. *No pneumothorax. Electronically Signed   By: Jules Schick M.D.   On: 12/19/2023 09:11     Medications:    albumin human Stopped (12/20/23 1341)    atorvastatin  40 mg Oral Daily   cholestyramine  4 g Oral BID   fluticasone  2 spray Each Nare Daily   furosemide  60 mg Intravenous BID   insulin aspart  0-9 Units  Subcutaneous TID WC   magnesium oxide  200 mg Oral BID   pantoprazole  40 mg Oral Daily   potassium chloride SA  10 mEq Oral BID   sodium chloride flush  3 mL Intravenous Q12H   acetaminophen, albuterol, clobetasol ointment, dextromethorphan, diphenhydrAMINE, guaiFENesin, ipratropium-albuterol, ondansetron (ZOFRAN) IV, sodium chloride flush, triamcinolone ointment  Assessment/ Plan:  Mr. OTTIS VACHA is a 83 y.o.  male with medical problems of sCHF with EF < 20%, CKD-4, HTN, HLD, DM, COPD on 2L O2, CAD with stent and hx of CABG, stroke, A fib on Eliquis, s/p of AICD, melanoma, psoriatic arthritis, pancreatitis, liver cirrhosis. Patient presents to ED with increased lower extremity edema and has been admitted for CHF (congestive heart failure) (HCC) [I50.9]   Cronic kidney disease stage IIIb with baseline creatinine 2.2 and GFR of 30 on 09/30/23. Renal ultrasound pending. Patient appears hypervolemic on exam.  Creatinine elevated likely secondary to diuresis. No acute indication for dialysis. Will continue to monitor renal indices.   2. Acute respiratory failure, likely secondary to CHF decompensation. Volume overloaded. Baseline oxygen, 2.5L, now 4L. Receiving IV furosemide 60mg  twice daily. Paracentesis yesterday with 6.7L removed. .   3. Anemia of chronic kidney disease Lab Results  Component Value Date   HGB 12.1 (L) 12/20/2023    Hgb within desired target.   4. Hypertension with chronic kidney disease. Home regimen includes isosorbide and torsemide. Blood pressure stable    LOS: 1 Skylur Fuston 1/25/20252:04 PM

## 2023-12-21 DIAGNOSIS — I5043 Acute on chronic combined systolic (congestive) and diastolic (congestive) heart failure: Secondary | ICD-10-CM | POA: Diagnosis not present

## 2023-12-21 DIAGNOSIS — R188 Other ascites: Secondary | ICD-10-CM | POA: Diagnosis not present

## 2023-12-21 DIAGNOSIS — Z515 Encounter for palliative care: Secondary | ICD-10-CM | POA: Diagnosis not present

## 2023-12-21 LAB — GLUCOSE, CAPILLARY
Glucose-Capillary: 114 mg/dL — ABNORMAL HIGH (ref 70–99)
Glucose-Capillary: 135 mg/dL — ABNORMAL HIGH (ref 70–99)
Glucose-Capillary: 139 mg/dL — ABNORMAL HIGH (ref 70–99)

## 2023-12-21 LAB — CBC
HCT: 36 % — ABNORMAL LOW (ref 39.0–52.0)
Hemoglobin: 11.4 g/dL — ABNORMAL LOW (ref 13.0–17.0)
MCH: 29.4 pg (ref 26.0–34.0)
MCHC: 31.7 g/dL (ref 30.0–36.0)
MCV: 92.8 fL (ref 80.0–100.0)
Platelets: 87 10*3/uL — ABNORMAL LOW (ref 150–400)
RBC: 3.88 MIL/uL — ABNORMAL LOW (ref 4.22–5.81)
RDW: 18.8 % — ABNORMAL HIGH (ref 11.5–15.5)
WBC: 10.4 10*3/uL (ref 4.0–10.5)
nRBC: 0.8 % — ABNORMAL HIGH (ref 0.0–0.2)

## 2023-12-21 LAB — COMPREHENSIVE METABOLIC PANEL
ALT: 15 U/L (ref 0–44)
AST: 30 U/L (ref 15–41)
Albumin: 3.3 g/dL — ABNORMAL LOW (ref 3.5–5.0)
Alkaline Phosphatase: 105 U/L (ref 38–126)
Anion gap: 13 (ref 5–15)
BUN: 90 mg/dL — ABNORMAL HIGH (ref 8–23)
CO2: 23 mmol/L (ref 22–32)
Calcium: 8.7 mg/dL — ABNORMAL LOW (ref 8.9–10.3)
Chloride: 94 mmol/L — ABNORMAL LOW (ref 98–111)
Creatinine, Ser: 2.88 mg/dL — ABNORMAL HIGH (ref 0.61–1.24)
GFR, Estimated: 21 mL/min — ABNORMAL LOW (ref >60)
Glucose, Bld: 109 mg/dL — ABNORMAL HIGH (ref 70–99)
Potassium: 4.1 mmol/L (ref 3.5–5.1)
Sodium: 130 mmol/L — ABNORMAL LOW (ref 135–145)
Total Bilirubin: 1.9 mg/dL — ABNORMAL HIGH (ref 0.0–1.2)
Total Protein: 6.3 g/dL — ABNORMAL LOW (ref 6.5–8.1)

## 2023-12-21 MED ORDER — FUROSEMIDE 40 MG PO TABS
40.0000 mg | ORAL_TABLET | Freq: Two times a day (BID) | ORAL | Status: DC
  Administered 2023-12-21 – 2023-12-22 (×2): 40 mg via ORAL
  Filled 2023-12-21 (×2): qty 1

## 2023-12-21 NOTE — Plan of Care (Signed)
  Problem: Education: Goal: Ability to describe self-care measures that may prevent or decrease complications (Diabetes Survival Skills Education) will improve Outcome: Progressing   Problem: Coping: Goal: Ability to adjust to condition or change in health will improve Outcome: Progressing   Problem: Fluid Volume: Goal: Ability to maintain a balanced intake and output will improve Outcome: Progressing   Problem: Metabolic: Goal: Ability to maintain appropriate glucose levels will improve Outcome: Progressing   Problem: Nutritional: Goal: Maintenance of adequate nutrition will improve Outcome: Progressing   Problem: Skin Integrity: Goal: Risk for impaired skin integrity will decrease Outcome: Progressing   Problem: Clinical Measurements: Goal: Respiratory complications will improve Outcome: Progressing Goal: Cardiovascular complication will be avoided Outcome: Progressing

## 2023-12-21 NOTE — Progress Notes (Signed)
[facility]  ROUNDING NOTE   Subjective:   Taylor [name] is a 83 y.o. with medical problems of sCHF with EF < 20%, CKD-4, HTN, HLD, DM, COPD on 2L O2, CAD with stent and hx of CABG, stroke, A fib on Eliquis, s/p of AICD, melanoma, psoriatic arthritis, pancreatitis, liver cirrhosis. Patient presents to ED with increased lower extremity edema and has been admitted for CHF (congestive heart failure) (HCC) [I50.9] SOB (shortness of breath) [R06.02] Ascites of liver [R18.8] Acute on chronic congestive heart failure, unspecified heart failure type [facility]) [I50.9]  Patient is known to our practice and is followed by Dr Suezanne Jacquet.   Patient seen sitting at side of bed Visitor at bedside Patient currently on room air, no signs of distress No lower extremity edema  Creatinine 2.88  Objective:  Vital signs in last 24 hours:  Temp:  [97.4 F (36.3 C)-98.6 F (37 C)] 97.4 F (36.3 C) (01/26 0854) Pulse Rate:  [58-87] 80 (01/26 1122) Resp:  [16-22] 17 (01/26 0854) BP: (106-134)/(83-102) 129/96 (01/26 1122) SpO2:  [92 %-100 %] 95 % (01/26 1122) Weight:  [71.4 kg-71.9 kg] 71.4 kg (01/26 0703)  Weight change: -2.903 kg Filed Weights   12/19/23 0836 12/20/23 1635 12/21/23 0703  Weight: 74.8 kg 71.9 kg 71.4 kg    Intake/Output: I/O last 3 completed shifts: In: 1342.3 [P.O.:1200; IV Piggyback:142.3] Out: 600 [Urine:600]   Intake/Output this shift:  No intake/output data recorded.  Physical Exam: General: NAD,   Head: Normocephalic, atraumatic. Moist oral mucosal membranes  Eyes: Anicteric  Lungs:  Clear to auscultation, room air  Heart: Regular rate and rhythm  Abdomen:  Firm, nontender, distention  Extremities: Trace peripheral edema.  Neurologic: Alert, moving all four extremities  Skin: No lesions       Basic Metabolic Panel: Recent Labs  Lab 12/19/23 0841 12/20/23 0511 12/21/23 0423  NA 135 132* 130*  K 3.2* 3.9 4.1  CL 96* 94* 94*  CO2 23 25 23   GLUCOSE  143* 126* 109*  BUN 79* 81* 90*  CREATININE 2.66* 2.91* 2.88*  CALCIUM 8.3* 8.6* 8.7*    Liver Function Tests: Recent Labs  Lab 12/19/23 0841 12/20/23 0511 12/21/23 0423  AST 47* 40 30  ALT 18 17 15   ALKPHOS 150* 138* 105  BILITOT 1.9* 2.1* 1.9*  PROT 6.9 6.6 6.3*  ALBUMIN 2.5* 2.8* 3.3*   No results for input(s): "LIPASE", "AMYLASE" in the last 168 hours. No results for input(s): "AMMONIA" in the last 168 hours.  CBC: Recent Labs  Lab 12/19/23 0841 12/20/23 0511 12/21/23 0423  WBC 6.7 5.2 10.4  HGB 12.1* 12.1* 11.4*  HCT 40.6 39.7 36.0*  MCV 96.9 96.4 92.8  PLT 111* 79* 87*    Cardiac Enzymes: No results for input(s): "CKTOTAL", "CKMB", "CKMBINDEX", "TROPONINI" in the last 168 hours.  BNP: Invalid input(s): "POCBNP"  CBG: Recent Labs  Lab 12/19/23 2157 12/20/23 0908 12/20/23 1208 12/20/23 2029 12/21/23 0822  GLUCAP 138* 116* 162* 148* 114*    Microbiology: Results for orders placed or performed during the hospital encounter of 12/19/23  Resp panel by RT-PCR (RSV, Flu A&B, Covid) Anterior Nasal Swab     Status: None   Collection Time: 12/19/23  8:54 AM   Specimen: Anterior Nasal Swab  Result Value Ref Range Status   SARS Coronavirus 2 by RT PCR NEGATIVE NEGATIVE Final    Comment: (NOTE) SARS-CoV-2 target nucleic acids are NOT DETECTED.  The SARS-CoV-2 RNA is generally detectable in upper  respiratory specimens during the acute phase of infection. The lowest concentration of SARS-CoV-2 viral copies this assay can detect is 138 copies/mL. A negative result does not preclude SARS-Cov-2 infection and should not be used as the sole basis for treatment or other patient management decisions. A negative result may occur with  improper specimen collection/handling, submission of specimen other than nasopharyngeal swab, presence of viral mutation(s) within the areas targeted by this assay, and inadequate number of viral copies(<138 copies/mL). A negative  result must be combined with clinical observations, patient history, and epidemiological information. The expected result is Negative.  Fact Sheet for Patients:  BloggerCourse.com  Fact Sheet for Healthcare Providers:  SeriousBroker.it  This test is no t yet approved or cleared by the [place] FDA and  has been authorized for detection and/or diagnosis of SARS-CoV-2 by FDA under an Emergency Use Authorization (EUA). This EUA will remain  in effect (meaning this test can be used) for the duration of the COVID-19 declaration under Section 564(b)(1) of the Act, 21 U.S.C.section 360bbb-3(b)(1), unless the authorization is terminated  or revoked sooner.       Influenza A by PCR NEGATIVE NEGATIVE Final   Influenza B by PCR NEGATIVE NEGATIVE Final    Comment: (NOTE) The Xpert Xpress SARS-CoV-2/FLU/RSV plus assay is intended as an aid in the diagnosis of influenza from Nasopharyngeal swab specimens and should not be used as a sole basis for treatment. Nasal washings and aspirates are unacceptable for Xpert Xpress SARS-CoV-2/FLU/RSV testing.  Fact Sheet for Patients: BloggerCourse.com  Fact Sheet for Healthcare Providers: SeriousBroker.it  This test is not yet approved or cleared by the [place] FDA and has been authorized for detection and/or diagnosis of SARS-CoV-2 by FDA under an Emergency Use Authorization (EUA). This EUA will remain in effect (meaning this test can be used) for the duration of the COVID-19 declaration under Section 564(b)(1) of the Act, 21 U.S.C. section 360bbb-3(b)(1), unless the authorization is terminated or revoked.     Resp Syncytial Virus by PCR NEGATIVE NEGATIVE Final    Comment: (NOTE) Fact Sheet for Patients: BloggerCourse.com  Fact Sheet for Healthcare Providers: SeriousBroker.it  This  test is not yet approved or cleared by the [place] FDA and has been authorized for detection and/or diagnosis of SARS-CoV-2 by FDA under an Emergency Use Authorization (EUA). This EUA will remain in effect (meaning this test can be used) for the duration of the COVID-19 declaration under Section 564(b)(1) of the Act, 21 U.S.C. section 360bbb-3(b)(1), unless the authorization is terminated or revoked.  Performed at [facility], [address] [place], [place] [address]     Coagulation Studies: Recent Labs    12/20/23 0511  LABPROT 18.3*  INR 1.5*    Urinalysis: No results for input(s): "COLORURINE", "LABSPEC", "PHURINE", "GLUCOSEU", "HGBUR", "BILIRUBINUR", "KETONESUR", "PROTEINUR", "UROBILINOGEN", "NITRITE", "LEUKOCYTESUR" in the last 72 hours.  Invalid input(s): "APPERANCEUR"    Imaging: DG Chest 1 View Result Date: 12/20/2023 CLINICAL DATA:  CHF. EXAM: CHEST  1 VIEW COMPARISON:  12/19/2023 FINDINGS: The cardio pericardial silhouette is enlarged. Vascular congestion with superimposed interstitial opacity suggesting edema. Right base collapse/consolidation with effusion is similar to prior. Bones are diffusely demineralized. Telemetry leads overlie the chest. IMPRESSION: 1. Vascular congestion with superimposed interstitial opacity suggesting edema. 2. Right base collapse/consolidation with effusion, similar to prior. Electronically Signed   By: Kennith Center M.D.   On: 12/20/2023 08:14   US Paracentesis Result Date: 12/19/2023 INDICATION: Patient is a 83 y/o male with  history of heart failure, CKD and cirrhosis. Patient presents for a paracentesis due to recurrent ascites. EXAM: ULTRASOUND GUIDED THERAPEUTIC PARACENTESIS MEDICATIONS: 10mL lidocaine 1% COMPLICATIONS: None immediate. PROCEDURE: Informed written consent was obtained from the patient after a discussion of the risks, benefits and alternatives to treatment. A timeout was performed prior to the initiation  of the procedure. Initial ultrasound scanning demonstrates a large amount of ascites within the right upper abdominal quadrant. The right upper abdomen was prepped and draped in the usual sterile fashion. 1% lidocaine was used for local anesthesia. Following this, a 6 Fr Safe-T-Centesis catheter was introduced. An ultrasound image was saved for documentation purposes. The paracentesis was performed. The catheter was removed and a dressing was applied. The patient tolerated the procedure well without immediate post procedural complication. Patient received post-procedure intravenous albumin; see nursing notes for details. FINDINGS: A total of approximately 6700 mL of yellow fluid was removed. IMPRESSION: Successful ultrasound-guided paracentesis yielding 6700 mL of peritoneal fluid. Performed by Philipp Ovens PA-C PLAN: If the patient eventually requires >/=2 paracenteses in a 30 day period, candidacy for formal evaluation by the [facility]pert[facility] will be assessed. Roanna Banning, MD [facility] Radiology Electronically Signed   By: Roanna Banning M.D.   On: 12/19/2023 15:29     Medications:      atorvastatin  40 mg Oral Daily   cholestyramine  4 g Oral BID   fluticasone  2 spray Each Nare Daily   furosemide  40 mg Oral BID   insulin aspart  0-9 Units Subcutaneous TID WC   magnesium oxide  200 mg Oral BID   pantoprazole  40 mg Oral Daily   potassium chloride SA  10 mEq Oral BID   sodium chloride flush  3 mL Intravenous Q12H   acetaminophen, albuterol, clobetasol ointment, dextromethorphan, diphenhydrAMINE, guaiFENesin, ipratropium-albuterol, morphine injection, ondansetron (ZOFRAN) IV, oxyCODONE-acetaminophen, sodium chloride flush, triamcinolone ointment  Assessment/ Plan:  [name] is a 83 y.o.  male with medical problems of sCHF with EF < 20%, CKD-4, HTN, HLD, DM, COPD on 2L O2, CAD with stent  and hx of CABG, stroke, A fib on Eliquis, s/p of AICD, melanoma, psoriatic arthritis, pancreatitis, liver cirrhosis. Patient presents to ED with increased lower extremity edema and has been admitted for CHF (congestive heart failure) (HCC) [I50.9] SOB (shortness of breath) [R06.02] Ascites of liver [R18.8] Acute on chronic congestive heart failure, unspecified heart failure type (HCC) [I50.9]   Cronic kidney disease stage IIIb with baseline creatinine 2.2 and GFR of 30 on 09/30/23. Renal ultrasound pending. Patient appears hypervolemic on exam.  Creatinine acceptable, 2.88.  BUN slightly elevated at 90.  Patient appears euvolemic on exam.  Will transition from IV furosemide to oral furosemide 40 mg twice a day.  Patient will need to follow-up with our office at discharge.  2. Acute respiratory failure, likely secondary to CHF decompensation. Volume overloaded. Baseline oxygen, 2.5L.  Currently on room air.  Paracentesis on 12/15/2023 with 6.7L removed. .   3. Anemia of chronic kidney disease Lab Results  Component Value Date   HGB 11.4 (L) 12/21/2023    Hemoglobin at goal for renal patient.  4. Hypertension with chronic kidney disease. Home regimen includes isosorbide and torsemide. Blood pressure stable for this patient.    LOS: 2 Abyan Cadman 1/26/202511:46 AM

## 2023-12-21 NOTE — Progress Notes (Signed)
Daily Progress Note   Patient Name: Taylor [name]      Date: 12/21/2023 DOB: 02-Jan-1941  Age: 83 y.o. MRN#: [id number] Attending Physician: Charise Killian, MD Primary Care Physician: Malva Limes, MD Admit Date: 12/19/2023  Reason for Consultation/Follow-up: Establishing goals of care  HPI/Brief Hospital Review: 83 y.o. male  with past medical history of COPD with chronic respiratory failure on 2L [place] at baseline, chronic combined HFrEF with EF less than 20%, CAD s/p CABG and AICD with PPM placement, cirrhosis with ascites, CKD stage IV, PAF on Eliquis, CVA, HTN, HLD admitted from home on 12/19/2023 with [facility] and abdominal distention.   Reportedly at home attempted to increase dose of Torsemide from 20 to 60 mg daily due to fluid retention and SHOB without improvement in symptoms.   Admitted and being treated for acute on chronic respiratory failure with hypoxia, acute exacerbation of chronic CHF, acute pulmonary edema, AKI superimposed on CKD, acute on chronic decompensated cirrhosis   Underwent paracentesis 1/24, 6.7 liters of fluid removed   Palliative medicine was consulted for assisting with goals of care conversations with concern for poor prognosis related to multiorgan failure.  Subjective: Extensive chart review has been completed prior to meeting patient including labs, vital signs, imaging, progress notes, orders, and available advanced directive documents from current and previous encounters.    Visited with Mr. Meggett at his bedside. He is resting in bed comfortably. He awakes, attempts to communicate but speech garbled and easily drifts back to sleep without redirection. Significant decline compared to yesterday visit. Wife-Patricia at bedside. [name] and Mr. Redmon met with  hospice liaison earlier and have opted to return home with hospice services following. Planning for discharge home tomorrow.  [name] shares her concern related to Mr. Palazzi hallucinating overnight and throughout the day. We again discussed chronic disease trajectory and expectations at end of life. [name] shares a sense of being overwhelmed with decision making and process of returning home with hospice. Emotional support provided.  Answered and addressed all questions and concerns. PMT to remain available for ongoing needs and support. PMT will follow peripherally, please reengage as appropriate.  Care plan was discussed with primary team, TOC and [facility] HL.  Thank you for allowing the Palliative Medicine Team to assist in the care of this patient.  Total  time:  25 minutes  Time spent includes: Detailed review of medical records (labs, imaging, vital signs), medically appropriate exam (mental status, respiratory, cardiac, skin), discussed with treatment team, counseling and educating patient, family and staff, documenting clinical information, medication management and coordination of care.  Leeanne Deed, DNP, AGNP-C Palliative Medicine   Please contact Palliative Medicine Team phone at [phone] for questions and concerns.

## 2023-12-21 NOTE — Progress Notes (Signed)
[facility] LIAISON NOTE   Received request from Taylor Cousin, RN, Transitions of Care Manager, for hospice services at home after discharge. Spoke with patient and his wife Taylor [name] at the bedside to initiate education related to hospice philosophy, services, and team approach to care. Patient/family verbalized understanding of information given. Per discussion, the plan is for discharge home likely tomorrow.  DME needs discussed.  Patient has the following equipment in the home:  O2, 2 wheeled walker, CPap, shower chair Patient/family requests the following equipment for delivery:   Hospice Assessment RN to assess in the home.  The address has been verified and is correct in the chart.  Taylor [name], spouse, is the family contact to arrange time of equipment delivery.  Please send signed and completed DNR home with patient/family if applicable.   Please provide prescriptions at discharge as needed to ensure ongoing symptom management.  [facility] information and contact numbers given to Taylor [name], spouse.  Above information shared with Taylor Cousin, RN, Transitions of Care Manager and hospital medical care team.  Please call with any hospice related questions or concerns.  Thank you for the opportunity to participate in this patient's care.  Taylor Cross, RN Nurse Liaison [phone]

## 2023-12-21 NOTE — TOC Transition Note (Signed)
Transition of Care [facility]) - Discharge Note   Patient Details  Name: Taylor [name] MRN: [id number] Date of Birth: [birth year]  Transition of Care [facility]) CM/SW Contact:  Luvenia Redden, RN Phone Number: 12/21/2023, 10:43 AM   Clinical Narrative:    [facility] RNCM collaboration with team indicates family/pt interested in home with hospice through [facility]. Official requested referral accepted for intervention with hospice liaison.   TOC will remain available for any additional needs.          Patient Goals and CMS Choice            Discharge Placement                       Discharge Plan and Services Additional resources added to the After Visit Summary for                                       Social Drivers of Health (SDOH) Interventions SDOH Screenings   Food Insecurity: No Food Insecurity (12/20/2023)  Housing: Low Risk  (12/20/2023)  Transportation Needs: No Transportation Needs (12/20/2023)  Utilities: Not At Risk (12/20/2023)  Alcohol Screen: Low Risk  (05/12/2023)  Depression (PHQ2-9): Low Risk  (10/22/2023)  Recent Concern: Depression (PHQ2-9) - High Risk (10/06/2023)  Financial Resource Strain: Low Risk  (12/19/2023)  Physical Activity: Inactive (12/19/2023)  Social Connections: Socially Integrated (12/20/2023)  Stress: No Stress Concern Present (12/19/2023)  Tobacco Use: Medium Risk (12/03/2023)  Health Literacy: Adequate Health Literacy (10/22/2023)     Readmission Risk Interventions    09/12/2023    3:46 PM  Readmission Risk Prevention Plan  Transportation Screening Complete  Medication Review (RN Care Manager) Complete  PCP or Specialist appointment within 3-5 days of discharge Complete  HRI or Home Care Consult Complete  SW Recovery Care/Counseling Consult Complete  Palliative Care Screening Not Applicable  Skilled Nursing Facility Complete

## 2023-12-21 NOTE — Progress Notes (Signed)
PROGRESS NOTE    LASEAN GORNIAK  ZOX:[id number] DOB: [birth year] DOA: 12/19/2023 PCP: Malva Limes, MD   Assessment & Plan:   Principal Problem:   CHF (congestive heart failure) (HCC) Active Problems:   Acute on chronic combined systolic and diastolic CHF (congestive heart failure) (HCC)   Acute pulmonary edema (HCC)   Acute kidney injury superimposed on stage 4 chronic kidney disease (HCC)  Assessment and Plan: Acute hypoxic respiratory failure: secondary acute on chronic combined CHF & acute pulmonary edema. Continue on supplemental oxygen and wean as tolerated.   Acute on chronic combined CHF exacerbation: continue on lasix. Monitor I/Os.     Cirrhosis decompensated: s/p therapeutic paracentesis & albumin given. Low suspicion for SBP. Not on lactulose or rifaximin as per home med rec    AKI on CKDIV: Cr is trending down from day prior. Avoid nephrotoxic meds    Multiorgan failure: as stated above. Management as stated above. Prognosis is poor. Pt and pt's wife have agreed to d/c home w/ hospice tomorrow    PAF: on chronic anticoagulation but not on any rate controlling meds though. Has frequent epistaxis. D/c eliquis.   Hyponatremia: likely secondary to cirrhosis. Labile      DVT prophylaxis: SCDs Code Status: full  Family Communication:  Disposition Plan: will d/c home tomorrow w/ hospice   Level of care: Telemetry Cardiac  Status is: Inpatient Remains inpatient appropriate because: severity of illness. Will d/c home tomorrow w/ hospice     Consultants:  Palliative care Nephro  Hospice   Procedures:   Antimicrobials:    Subjective: Pt c/o intermittent hallucinations   Objective: Vitals:   12/21/23 0330 12/21/23 0703 12/21/23 0834 12/21/23 0854  BP: 118/87  129/89   Pulse: 76   82  Resp: 18  16 17   Temp: (!) 97.5 F (36.4 C)   (!) 97.4 F (36.3 C)  TempSrc:      SpO2: 100%   100%  Weight:  71.4 kg    Height:        Intake/Output Summary  (Last 24 hours) at 12/21/2023 1055 Last data filed at 12/21/2023 0330 Gross per 24 hour  Intake 1200 ml  Output 600 ml  Net 600 ml   Filed Weights   12/19/23 0836 12/20/23 1635 12/21/23 0703  Weight: 74.8 kg 71.9 kg 71.4 kg    Examination:  General exam: appears comfortable  Respiratory system: diminished breath sounds b/l  Cardiovascular system: S1/S2+. No rubs or clicks  Gastrointestinal system: Abd is soft, NT, hypoactive bowel sounds  Central nervous system: alert & awake. Moves all extremities  Psychiatry: judgement and insight appears not at baseline. Appropriate mood and affect     Data Reviewed: I have personally reviewed following labs and imaging studies  CBC: Recent Labs  Lab 12/19/23 0841 12/20/23 0511 12/21/23 0423  WBC 6.7 5.2 10.4  HGB 12.1* 12.1* 11.4*  HCT 40.6 39.7 36.0*  MCV 96.9 96.4 92.8  PLT 111* 79* 87*   Basic Metabolic Panel: Recent Labs  Lab 12/19/23 0841 12/20/23 0511 12/21/23 0423  NA 135 132* 130*  K 3.2* 3.9 4.1  CL 96* 94* 94*  CO2 23 25 23   GLUCOSE 143* 126* 109*  BUN 79* 81* 90*  CREATININE 2.66* 2.91* 2.88*  CALCIUM 8.3* 8.6* 8.7*   GFR: Estimated Creatinine Clearance: 20.3 mL/min (A) (by C-G formula based on SCr of 2.88 mg/dL (H)). Liver Function Tests: Recent Labs  Lab 12/19/23 [phone] 12/20/23 0511 12/21/23 0423  AST 47* 40 30  ALT 18 17 15   ALKPHOS 150* 138* 105  BILITOT 1.9* 2.1* 1.9*  PROT 6.9 6.6 6.3*  ALBUMIN 2.5* 2.8* 3.3*   No results for input(s): "LIPASE", "AMYLASE" in the last 168 hours. No results for input(s): "AMMONIA" in the last 168 hours. Coagulation Profile: Recent Labs  Lab 12/20/23 0511  INR 1.5*   Cardiac Enzymes: No results for input(s): "CKTOTAL", "CKMB", "CKMBINDEX", "TROPONINI" in the last 168 hours. BNP (last 3 results) No results for input(s): "PROBNP" in the last 8760 hours. HbA1C: No results for input(s): "HGBA1C" in the last 72 hours. CBG: Recent Labs  Lab 12/19/23 2157  12/20/23 0908 12/20/23 1208 12/20/23 2029 12/21/23 0822  GLUCAP 138* 116* 162* 148* 114*   Lipid Profile: No results for input(s): "CHOL", "HDL", "LDLCALC", "TRIG", "CHOLHDL", "LDLDIRECT" in the last 72 hours. Thyroid Function Tests: No results for input(s): "TSH", "T4TOTAL", "FREET4", "T3FREE", "THYROIDAB" in the last 72 hours. Anemia Panel: No results for input(s): "VITAMINB12", "FOLATE", "FERRITIN", "TIBC", "IRON", "RETICCTPCT" in the last 72 hours. Sepsis Labs: No results for input(s): "PROCALCITON", "LATICACIDVEN" in the last 168 hours.  Recent Results (from the past 240 hours)  Resp panel by RT-PCR (RSV, Flu A&B, Covid) Anterior Nasal Swab     Status: None   Collection Time: 12/19/23  8:54 AM   Specimen: Anterior Nasal Swab  Result Value Ref Range Status   SARS Coronavirus 2 by RT PCR NEGATIVE NEGATIVE Final    Comment: (NOTE) SARS-CoV-2 target nucleic acids are NOT DETECTED.  The SARS-CoV-2 RNA is generally detectable in upper respiratory specimens during the acute phase of infection. The lowest concentration of SARS-CoV-2 viral copies this assay can detect is 138 copies/mL. A negative result does not preclude SARS-Cov-2 infection and should not be used as the sole basis for treatment or other patient management decisions. A negative result may occur with  improper specimen collection/handling, submission of specimen other than nasopharyngeal swab, presence of viral mutation(s) within the areas targeted by this assay, and inadequate number of viral copies(<138 copies/mL). A negative result must be combined with clinical observations, patient history, and epidemiological information. The expected result is Negative.  Fact Sheet for Patients:  BloggerCourse.com  Fact Sheet for Healthcare Providers:  SeriousBroker.it  This test is no t yet approved or cleared by the [place] FDA and  has been authorized for  detection and/or diagnosis of SARS-CoV-2 by FDA under an Emergency Use Authorization (EUA). This EUA will remain  in effect (meaning this test can be used) for the duration of the COVID-19 declaration under Section 564(b)(1) of the Act, 21 U.S.C.section 360bbb-3(b)(1), unless the authorization is terminated  or revoked sooner.       Influenza A by PCR NEGATIVE NEGATIVE Final   Influenza B by PCR NEGATIVE NEGATIVE Final    Comment: (NOTE) The Xpert Xpress SARS-CoV-2/FLU/RSV plus assay is intended as an aid in the diagnosis of influenza from Nasopharyngeal swab specimens and should not be used as a sole basis for treatment. Nasal washings and aspirates are unacceptable for Xpert Xpress SARS-CoV-2/FLU/RSV testing.  Fact Sheet for Patients: BloggerCourse.com  Fact Sheet for Healthcare Providers: SeriousBroker.it  This test is not yet approved or cleared by the [place] FDA and has been authorized for detection and/or diagnosis of SARS-CoV-2 by FDA under an Emergency Use Authorization (EUA). This EUA will remain in effect (meaning this test can be used) for the duration of the COVID-19 declaration under Section 564(b)(1) of the Act, 21  U.S.C. section 360bbb-3(b)(1), unless the authorization is terminated or revoked.     Resp Syncytial Virus by PCR NEGATIVE NEGATIVE Final    Comment: (NOTE) Fact Sheet for Patients: BloggerCourse.com  Fact Sheet for Healthcare Providers: SeriousBroker.it  This test is not yet approved or cleared by the [place] FDA and has been authorized for detection and/or diagnosis of SARS-CoV-2 by FDA under an Emergency Use Authorization (EUA). This EUA will remain in effect (meaning this test can be used) for the duration of the COVID-19 declaration under Section 564(b)(1) of the Act, 21 U.S.C. section 360bbb-3(b)(1), unless the authorization is  terminated or revoked.  Performed at [facility], [address] [place], [place] [address]          Radiology Studies: DG Chest 1 View Result Date: 12/20/2023 CLINICAL DATA:  CHF. EXAM: CHEST  1 VIEW COMPARISON:  12/19/2023 FINDINGS: The cardio pericardial silhouette is enlarged. Vascular congestion with superimposed interstitial opacity suggesting edema. Right base collapse/consolidation with effusion is similar to prior. Bones are diffusely demineralized. Telemetry leads overlie the chest. IMPRESSION: 1. Vascular congestion with superimposed interstitial opacity suggesting edema. 2. Right base collapse/consolidation with effusion, similar to prior. Electronically Signed   By: Kennith Center M.D.   On: 12/20/2023 08:14   US Paracentesis Result Date: 12/19/2023 INDICATION: Patient is a 83 y/o male with history of heart failure, CKD and cirrhosis. Patient presents for a paracentesis due to recurrent ascites. EXAM: ULTRASOUND GUIDED THERAPEUTIC PARACENTESIS MEDICATIONS: 10mL lidocaine 1% COMPLICATIONS: None immediate. PROCEDURE: Informed written consent was obtained from the patient after a discussion of the risks, benefits and alternatives to treatment. A timeout was performed prior to the initiation of the procedure. Initial ultrasound scanning demonstrates a large amount of ascites within the right upper abdominal quadrant. The right upper abdomen was prepped and draped in the usual sterile fashion. 1% lidocaine was used for local anesthesia. Following this, a 6 Fr Safe-T-Centesis catheter was introduced. An ultrasound image was saved for documentation purposes. The paracentesis was performed. The catheter was removed and a dressing was applied. The patient tolerated the procedure well without immediate post procedural complication. Patient received post-procedure intravenous albumin; see nursing notes for details. FINDINGS: A total of approximately 6700 mL of yellow fluid was removed.  IMPRESSION: Successful ultrasound-guided paracentesis yielding 6700 mL of peritoneal fluid. Performed by Philipp Ovens PA-C PLAN: If the patient eventually requires >/=2 paracenteses in a 30 day period, candidacy for formal evaluation by the [facility] Interventional Radiology Portal Hypertension [facility] will be assessed. Roanna Banning, MD [facility] Radiology Electronically Signed   By: Roanna Banning M.D.   On: 12/19/2023 15:29        Scheduled Meds:  atorvastatin  40 mg Oral Daily   cholestyramine  4 g Oral BID   fluticasone  2 spray Each Nare Daily   furosemide  40 mg Oral BID   insulin aspart  0-9 Units Subcutaneous TID WC   magnesium oxide  200 mg Oral BID   pantoprazole  40 mg Oral Daily   potassium chloride SA  10 mEq Oral BID   sodium chloride flush  3 mL Intravenous Q12H   Continuous Infusions:     LOS: 2 days      Charise Killian, MD [facility] Pager [phone]  If 7PM-7AM, please contact night-coverage [email or web address] 12/21/2023, 10:55 AM

## 2023-12-22 ENCOUNTER — Other Ambulatory Visit: Payer: Self-pay

## 2023-12-22 DIAGNOSIS — I5043 Acute on chronic combined systolic (congestive) and diastolic (congestive) heart failure: Secondary | ICD-10-CM | POA: Diagnosis not present

## 2023-12-22 LAB — GLUCOSE, CAPILLARY
Glucose-Capillary: 162 mg/dL — ABNORMAL HIGH (ref 70–99)
Glucose-Capillary: 128 mg/dL — ABNORMAL HIGH (ref 70–99)
Glucose-Capillary: 90 mg/dL (ref 70–99)
Glucose-Capillary: 122 mg/dL — ABNORMAL HIGH (ref 70–99)

## 2023-12-22 LAB — COMPREHENSIVE METABOLIC PANEL
ALT: 16 U/L (ref 0–44)
AST: 32 U/L (ref 15–41)
Albumin: 3.1 g/dL — ABNORMAL LOW (ref 3.5–5.0)
Alkaline Phosphatase: 113 U/L (ref 38–126)
Anion gap: 15 (ref 5–15)
BUN: 92 mg/dL — ABNORMAL HIGH (ref 8–23)
CO2: 20 mmol/L — ABNORMAL LOW (ref 22–32)
Calcium: 8.7 mg/dL — ABNORMAL LOW (ref 8.9–10.3)
Chloride: 93 mmol/L — ABNORMAL LOW (ref 98–111)
Creatinine, Ser: 2.8 mg/dL — ABNORMAL HIGH (ref 0.61–1.24)
GFR, Estimated: 22 mL/min — ABNORMAL LOW (ref >60)
Glucose, Bld: 94 mg/dL (ref 70–99)
Potassium: 4.1 mmol/L (ref 3.5–5.1)
Sodium: 128 mmol/L — ABNORMAL LOW (ref 135–145)
Total Bilirubin: 1.9 mg/dL — ABNORMAL HIGH (ref 0.0–1.2)
Total Protein: 6.4 g/dL — ABNORMAL LOW (ref 6.5–8.1)

## 2023-12-22 LAB — CBC
HCT: 39.9 % (ref 39.0–52.0)
Hemoglobin: 12.4 g/dL — ABNORMAL LOW (ref 13.0–17.0)
MCH: 29.7 pg (ref 26.0–34.0)
MCHC: 31.1 g/dL (ref 30.0–36.0)
MCV: 95.5 fL (ref 80.0–100.0)
Platelets: 101 10*3/uL — ABNORMAL LOW (ref 150–400)
RBC: 4.18 MIL/uL — ABNORMAL LOW (ref 4.22–5.81)
RDW: 18.8 % — ABNORMAL HIGH (ref 11.5–15.5)
WBC: 9.4 10*3/uL (ref 4.0–10.5)
nRBC: 0.7 % — ABNORMAL HIGH (ref 0.0–0.2)

## 2023-12-22 LAB — AMMONIA: Ammonia: 38 umol/L — ABNORMAL HIGH (ref 9–35)

## 2023-12-22 MED ORDER — LACTULOSE 10 GM/15ML PO SOLN: 20.0000 g | Freq: Two times a day (BID) | ORAL | 0 refills | Status: DC | PRN

## 2023-12-22 MED ORDER — OXYCODONE-ACETAMINOPHEN 5-325 MG PO TABS: 1.0000 | ORAL_TABLET | ORAL | 0 refills | Status: AC | PRN

## 2023-12-22 MED ORDER — LACTULOSE 10 GM/15ML PO SOLN
20.0000 g | Freq: Two times a day (BID) | ORAL | Status: DC
  Administered 2023-12-22: 20 g via ORAL
  Filled 2023-12-22: qty 30

## 2023-12-22 NOTE — Consult Note (Signed)
[facility] Liaison Consult Note    12/22/2023  Taylor [name] 19-Jul-1941 [id number]  Primary Care Provider:  Mila Merry, MD [facility]  Patient is currently active with Care Management for chronic disease management services.  Patient has been engaged by a Presenter, broadcasting.  Our community based plan of care has focused on disease management and community resource support.   Plan: Home with hospice. Liaison will collaborate with VBCI team to close case due to hospice involvement.   Inpatient Transition Of Care [TOC] team member to make aware that Care Management following.  Of note, Care Management services does not replace or interfere with any services that are needed or arranged by inpatient [facility] care management team.   For additional questions or referrals please contact: Elliot Cousin, RN, [facility] Liaison [place]   [facility], Population Health Office Hours MTWF  8:00 am-6:00 pm Direct Dial: [phone] mobile [phone] [Office toll free line] Office Hours are M-F 8:30 - 5 pm [name]

## 2023-12-22 NOTE — TOC Transition Note (Signed)
Transition of Care [facility]) - Discharge Note   Patient Details  Name: Taylor [name] MRN: [id number] Date of Birth: 04-02-1941  Transition of Care [facility]) CM/SW Contact:  Margarito Liner, LCSW Phone Number: 12/22/2023, 10:31 AM   Clinical Narrative: Patient has orders to discharge home with hospice today. [facility] liaison is aware. No further concerns. CSW signing off.    Final next level of care: Home w Hospice Care Barriers to Discharge: Barriers Resolved   Patient Goals and CMS Choice            Discharge Placement                Patient to be transferred to facility by: Wife   Patient and family notified of of transfer: 12/22/23  Discharge Plan and Services Additional resources added to the After Visit Summary for                                       Social Drivers of Health (SDOH) Interventions SDOH Screenings   Food Insecurity: No Food Insecurity (12/20/2023)  Housing: Low Risk  (12/20/2023)  Transportation Needs: No Transportation Needs (12/20/2023)  Utilities: Not At Risk (12/20/2023)  Alcohol Screen: Low Risk  (05/12/2023)  Depression (PHQ2-9): Low Risk  (10/22/2023)  Recent Concern: Depression (PHQ2-9) - High Risk (10/06/2023)  Financial Resource Strain: Low Risk  (12/19/2023)  Physical Activity: Inactive (12/19/2023)  Social Connections: Socially Integrated (12/20/2023)  Stress: No Stress Concern Present (12/19/2023)  Tobacco Use: Medium Risk (12/03/2023)  Health Literacy: Adequate Health Literacy (10/22/2023)     Readmission Risk Interventions    09/12/2023    3:46 PM  Readmission Risk Prevention Plan  Transportation Screening Complete  Medication Review (RN Care Manager) Complete  PCP or Specialist appointment within 3-5 days of discharge Complete  HRI or Home Care Consult Complete  SW Recovery Care/Counseling Consult Complete  Palliative Care Screening Not Applicable  Skilled Nursing Facility Complete

## 2023-12-22 NOTE — Patient Outreach (Signed)
  Care Management   Visit Note  12/22/2023 Name: Taylor [name] MRN: [id number] DOB: 08/26/1941  Subjective: Taylor [name] is a 83 y.o. year old male who is a primary care patient of [name], Demetrios Isaacs, MD. The Care Management team was consulted for assistance.      Engaged with patient's spouse Taylor [name] and Hospice Nurse Taylor [name].  [name] was readmitted to [facility] on 12/19/23 due to worsening shortness of breath and abdominal distention. He was discharged today. Noted to have multiorgan failure with poor prognosis.   Confirmed start of Home Health Hospice with [facility]. Hospice nurse, Taylor [name] confirmed that all orders have been received for required medications to keep [name] comfortable. Reports the Hospice Physician will serve as the Primary Provider. Agreed to update the care team or contact the clinic if [name] status changes.   PLAN Patient transitioned to [facility] team. No further interventions required.    Taylor [name], [facility] Population Health RN Care Manager Direct Dial: [phone]  Fax: [phone] Website: [email or web address]

## 2023-12-22 NOTE — Care Management Important Message (Signed)
Important Message  Patient Details  Name: Taylor [name] MRN: [id number] Date of Birth: 01-Apr-1941   Important Message Given:  Yes - Medicare IM     Sherilyn Banker 12/22/2023, 11:17 AM

## 2023-12-22 NOTE — Discharge Summary (Signed)
Physician Discharge Summary  Taylor [name] ZOX:[id number] DOB: 09-Jul-1941 DOA: 12/19/2023  PCP: Malva Limes, MD  Admit date: 12/19/2023 Discharge date: 12/22/2023  Admitted From: home  Disposition:  home w/ hospice   Recommendations for Outpatient Follow-up:  Follow up with hospice provider ASAP   Home Health: no  Equipment/Devices:  Discharge Condition: stable  CODE STATUS: full  Diet recommendation: as tolerated  Brief/Interim Summary: HPI was taken from Dr. Chipper Herb: Taylor [name] is a 83 y.o. male with medical history significant of chronic combined HFrEF and HFpEF with LVEF less than 20% status post AICD and PPM, CKD stage IV, cirrhosis, with chronic ascites, IIDM, HTN, HLD, GERD, CVA, PAF on Eliquis, presented with worsening of shortness of breath and abdominal distention.   Patient has history of CHF and CKD at home on 20 mg of torsemide recently to preserve kidney function however 1 week ago he started to have increasing shortness of breath and noticeable abdominal distention which is suspect to be a worsening of ascites.  Denied any abdominal pain no nausea vomiting or diarrhea.  No cough no chest pains.  For the last 7+ days he tried to increase his torsemide from 20 mg to 60 mg daily with some improvement however he continued to see buildup of fluid in his belly and increasing exertional dyspnea.  Since yesterday patient also started to have intermittent nosebleed and he also complaining about increasing of whole body itchiness. ED Course: Afebrile, blood pressure 120/90, ulceration 100% on 4 L.  Chest x-ray showed pulmonary edema, creatinine 2.6, BUN 79, bicarb 23, K3.2, albumin 2.5   Patient was started on IV Lasix 60 mg x 1 in the ED.  Discharge Diagnoses:  Principal Problem:   CHF (congestive heart failure) (HCC) Active Problems:   Acute on chronic combined systolic and diastolic CHF (congestive heart failure) (HCC)   Acute pulmonary edema (HCC)   Acute kidney injury  superimposed on stage 4 chronic kidney disease (HCC)  Acute hypoxic respiratory failure: secondary acute on chronic combined CHF & acute pulmonary edema. Continue on supplemental oxygen and wean as tolerated.    Acute on chronic combined CHF exacerbation: continue on lasix. Monitor I/Os.     Cirrhosis decompensated: s/p therapeutic paracentesis & albumin given. Low suspicion for SBP. Not on lactulose or rifaximin as per home med rec but prescribed lactulose at d/c prn    AKI on CKDIV: Cr is trending down from day prior    Multiorgan failure: as stated above. Management as stated above. Prognosis is poor. Pt and pt's wife have agreed to d/c home w/ hospice   PAF: on chronic anticoagulation but not on any rate controlling meds though. Has frequent epistaxis. D/c eliquis in setting of decompensated cirrhosis    Hyponatremia: likely secondary to cirrhosis. Labile   Discharge Instructions  Discharge Instructions     Diet - low sodium heart healthy   Complete by: As directed    Discharge instructions   Complete by: As directed    F/u w/ hospice provider as soon as possible   Increase activity slowly   Complete by: As directed       Allergies as of 12/22/2023       Reactions   Amlodipine Besylate Swelling   Had a reaction when taking with colcrys    Crestor [rosuvastatin]    Muscle cramps and pain. Tolerates atorvastatin        Medication List     STOP taking these medications  apixaban 2.5 MG Tabs tablet Commonly known as: ELIQUIS   dextromethorphan 15 MG/5ML syrup       TAKE these medications    acetaminophen 500 MG tablet Commonly known as: TYLENOL Take 500 mg by mouth every 6 (six) hours as needed for mild pain.   albuterol 108 (90 Base) MCG/ACT inhaler Commonly known as: VENTOLIN HFA Inhale 2 puffs into the lungs every 6 (six) hours as needed for wheezing or shortness of breath.   aspirin EC 81 MG tablet Take 81 mg by mouth daily.   atorvastatin 40 MG  tablet Commonly known as: LIPITOR TAKE 1 TABLET(40 MG) BY MOUTH DAILY   azithromycin 500 MG tablet Commonly known as: ZITHROMAX Take 500 mg by mouth daily.   clobetasol ointment 0.05 % Commonly known as: TEMOVATE Apply 1 Application topically 2 (two) times daily.   empagliflozin 10 MG Tabs tablet Commonly known as: JARDIANCE Take 1 tablet by mouth daily.   fluticasone 50 MCG/ACT nasal spray Commonly known as: FLONASE Place 2 sprays into both nostrils daily.   ipratropium-albuterol 0.5-2.5 (3) MG/3ML Soln Commonly known as: DUONEB Inhale 3 mLs into the lungs every 4 (four) hours as needed (wheezing, shob).   isosorbide mononitrate 30 MG 24 hr tablet Commonly known as: IMDUR Take 0.5 tablets (15 mg total) by mouth daily.   lactulose 10 GM/15ML solution Commonly known as: CHRONULAC Take 30 mLs (20 g total) by mouth 2 (two) times daily as needed for mild constipation or moderate constipation.   Magnesium 250 MG Tabs Take 125 mg by mouth 2 (two) times daily.   MUCINEX PO Take by mouth.   oxyCODONE-acetaminophen 5-325 MG tablet Commonly known as: PERCOCET/ROXICET Take 1 tablet by mouth every 4 (four) hours as needed for up to 3 days for moderate pain (pain score 4-6) or severe pain (pain score 7-10).   OXYGEN Inhale 2.5 L/min into the lungs continuous.   pantoprazole 40 MG tablet Commonly known as: PROTONIX TAKE 1 TABLET BY MOUTH EVERY DAY   potassium chloride SA 20 MEQ tablet Commonly known as: KLOR-CON M Take 10 mEq by mouth 2 (two) times daily.   torsemide 20 MG tablet Commonly known as: DEMADEX Take 1 tablet (20 mg total) by mouth daily. What changed: when to take this   triamcinolone ointment 0.1 % Commonly known as: KENALOG Apply topically 2 (two) times daily. APPLY TOPICALLY TWICE DAILY AS DIRECTED        Allergies  Allergen Reactions   Amlodipine Besylate Swelling    Had a reaction when taking with colcrys    Crestor [Rosuvastatin]     Muscle  cramps and pain. Tolerates atorvastatin    Consultations: Hospice Palliative care    Procedures/Studies: DG Chest 1 View Result Date: 12/20/2023 CLINICAL DATA:  CHF. EXAM: CHEST  1 VIEW COMPARISON:  12/19/2023 FINDINGS: The cardio pericardial silhouette is enlarged. Vascular congestion with superimposed interstitial opacity suggesting edema. Right base collapse/consolidation with effusion is similar to prior. Bones are diffusely demineralized. Telemetry leads overlie the chest. IMPRESSION: 1. Vascular congestion with superimposed interstitial opacity suggesting edema. 2. Right base collapse/consolidation with effusion, similar to prior. Electronically Signed   By: Kennith Center M.D.   On: 12/20/2023 08:14   US Paracentesis Result Date: 12/19/2023 INDICATION: Patient is a 83 y/o male with history of heart failure, CKD and cirrhosis. Patient presents for a paracentesis due to recurrent ascites. EXAM: ULTRASOUND GUIDED THERAPEUTIC PARACENTESIS MEDICATIONS: 10mL lidocaine 1% COMPLICATIONS: None immediate. PROCEDURE: Informed written consent was obtained  from the patient after a discussion of the risks, benefits and alternatives to treatment. A timeout was performed prior to the initiation of the procedure. Initial ultrasound scanning demonstrates a large amount of ascites within the right upper abdominal quadrant. The right upper abdomen was prepped and draped in the usual sterile fashion. 1% lidocaine was used for local anesthesia. Following this, a 6 Fr Safe-T-Centesis catheter was introduced. An ultrasound image was saved for documentation purposes. The paracentesis was performed. The catheter was removed and a dressing was applied. The patient tolerated the procedure well without immediate post procedural complication. Patient received post-procedure intravenous albumin; see nursing notes for details. FINDINGS: A total of approximately 6700 mL of yellow fluid was removed. IMPRESSION: Successful  ultrasound-guided paracentesis yielding 6700 mL of peritoneal fluid. Performed by Philipp Ovens PA-C PLAN: If the patient eventually requires >/=2 paracenteses in a 30 day period, candidacy for formal evaluation by the [facility] will be assessed. Roanna Banning, MD [facility] Electronically Signed   By: Roanna Banning M.D.   On: 12/19/2023 15:29   DG Chest Portable 1 View Result Date: 12/19/2023 CLINICAL DATA:  Shortness of breath. EXAM: PORTABLE CHEST 1 VIEW COMPARISON:  12/03/2023. FINDINGS: Low lung volume. There is small-to-moderate right pleural effusion, slightly increased since the prior study. There are probable associated compressive atelectatic changes in the right lung. There is probable trace left layering pleural effusion. There is moderate diffuse pulmonary vascular congestion. No pneumothorax. Stable mildly enlarged cardio-mediastinal silhouette. There is a left sided 3-lead pacemaker. No acute osseous abnormalities. The soft tissues are within normal limits. IMPRESSION: *Moderate diffuse pulmonary vascular congestion and cardiomegaly, favoring congestive heart failure/pulmonary edema. *Small-to-moderate right pleural effusion, slightly increased since the prior study. Probable trace left layering pleural effusion. *No pneumothorax. Electronically Signed   By: Jules Schick M.D.   On: 12/19/2023 09:11   US Carotid Bilateral Result Date: 12/03/2023 CLINICAL DATA:  Weakness and stroke-like symptoms, initial encounter EXAM: BILATERAL CAROTID DUPLEX ULTRASOUND TECHNIQUE: Wallace Cullens scale imaging, color Doppler and duplex ultrasound were performed of bilateral carotid and vertebral arteries in the neck. COMPARISON:  04/24/2022 FINDINGS: Criteria: Quantification of carotid stenosis is based on velocity parameters that correlate the residual internal carotid diameter with NASCET-based stenosis  levels, using the diameter of the distal internal carotid lumen as the denominator for stenosis measurement. The following velocity measurements were obtained: RIGHT ICA: 36/20 cm/sec CCA: 37/10 cm/sec SYSTOLIC ICA/CCA RATIO:  1.0 ECA: 18 cm/sec LEFT ICA: 43/19 cm/sec CCA: 36/13 cm/sec SYSTOLIC ICA/CCA RATIO:  1.2 ECA: 42 cm/sec RIGHT CAROTID ARTERY: Preliminary grayscale images demonstrate scattered atherosclerotic plaque. The waveforms, velocities and flow velocity ratios however show no evidence of focal hemodynamically significant stenosis. RIGHT VERTEBRAL ARTERY:  Antegrade in nature. LEFT CAROTID ARTERY: Preliminary grayscale images demonstrate scattered common carotid plaque. A stent is noted extending from the distal common carotid artery into the proximal internal carotid artery consistent with the given clinical history. No significant in stent stenosis is noted. LEFT VERTEBRAL ARTERY:  Antegrade in nature. IMPRESSION: No evidence of focal significant carotid stenosis. Changes consistent with prior left carotid stenting. Electronically Signed   By: Alcide Clever M.D.   On: 12/03/2023 17:34   DG Chest 2 View Result Date: 12/03/2023 CLINICAL DATA:  Shortness of breath. EXAM: CHEST - 2 VIEW COMPARISON:  09/10/2023. FINDINGS: There is mild-to-moderate diffuse pulmonary vascular congestion. There is right basilar opacity likely combination of right lung atelectasis and/or consolidation  with small right pleural effusion. No significant left pleural effusion. No pneumothorax on either side. Stable moderately enlarged cardio-mediastinal silhouette. There is a left sided 3-lead pacemaker. There are surgical staples along the heart border and sternotomy wires, status post CABG (coronary artery bypass graft). No acute osseous abnormalities. The soft tissues are within normal limits. IMPRESSION: *Findings favor congestive heart failure/pulmonary edema. *Right basilar opacity, as described above. Electronically Signed    By: Jules Schick M.D.   On: 12/03/2023 08:45   CT HEAD WO CONTRAST ( ) Result Date: 12/03/2023 CLINICAL DATA:  Weakness.  Double vision.  Nausea.  Diaphoresis. EXAM: CT HEAD WITHOUT CONTRAST TECHNIQUE: Contiguous axial images were obtained from the base of the skull through the vertex without intravenous contrast. RADIATION DOSE REDUCTION: This exam was performed according to the departmental dose-optimization program which includes automated exposure control, adjustment of the mA and/or kV according to patient size and/or use of iterative reconstruction technique. COMPARISON:  09/10/2023 FINDINGS: Brain: No abnormality is seen affecting the brainstem or cerebellum. Cerebral hemispheres show age related volume loss with chronic small-vessel ischemic changes of the white matter and an old left occipital infarction. No sign of acute infarction, mass lesion, hemorrhage, hydrocephalus or extra-axial collection. Vascular: There is atherosclerotic calcification of the major vessels at the base of the brain. Skull: Old healed left parietal skull fracture. Sinuses/Orbits: Mild mucosal thickening of the paranasal sinuses, improved since the study of October. Orbits negative. Other: None IMPRESSION: 1. No acute CT finding. Age related volume loss with chronic small-vessel ischemic changes of the white matter and an old left occipital infarction. 2. Old healed left parietal skull fracture. 3. Mild mucosal thickening of the paranasal sinuses, improved since the study of October. Electronically Signed   By: Paulina Fusi M.D.   On: 12/03/2023 08:35   (Echo, Carotid, EGD, Colonoscopy, ERCP)    Subjective: Pt c/o fatigue    Discharge Exam: Vitals:   12/22/23 0428 12/22/23 0830  BP: (!) 129/97 (!) 120/92  Pulse: 78 73  Resp: 11   Temp: 98 F (36.7 C) (!) 97.5 F (36.4 C)  SpO2: 96% 99%   Vitals:   12/21/23 2353 12/22/23 0428 12/22/23 0530 12/22/23 0830  BP: 131/86 (!) 129/97  (!) 120/92  Pulse: 75 78   73  Resp:  11    Temp: 98.4 F (36.9 C) 98 F (36.7 C)  (!) 97.5 F (36.4 C)  TempSrc: Rectal Rectal  Oral  SpO2: 100% 96%  99%  Weight:   75.9 kg   Height:        General: Pt is alert, awake, not in acute distress Cardiovascular: S1/S2 +, no rubs, no gallops Respiratory: decreased breath sounds b/l  Abdominal: Soft, NT,distended, bowel sounds + Extremities: no cyanosis    The results of significant diagnostics from this hospitalization (including imaging, microbiology, ancillary and laboratory) are listed below for reference.     Microbiology: Recent Results (from the past 240 hours)  Resp panel by RT-PCR (RSV, Flu A&B, Covid) Anterior Nasal Swab     Status: None   Collection Time: 12/19/23  8:54 AM   Specimen: Anterior Nasal Swab  Result Value Ref Range Status   SARS Coronavirus 2 by RT PCR NEGATIVE NEGATIVE Final    Comment: (NOTE) SARS-CoV-2 target nucleic acids are NOT DETECTED.  The SARS-CoV-2 RNA is generally detectable in upper respiratory specimens during the acute phase of infection. The lowest concentration of SARS-CoV-2 viral copies this assay can detect is 138 copies/mL.  A negative result does not preclude SARS-Cov-2 infection and should not be used as the sole basis for treatment or other patient management decisions. A negative result may occur with  improper specimen collection/handling, submission of specimen other than nasopharyngeal swab, presence of viral mutation(s) within the areas targeted by this assay, and inadequate number of viral copies(<138 copies/mL). A negative result must be combined with clinical observations, patient history, and epidemiological information. The expected result is Negative.  Fact Sheet for Patients:  BloggerCourse.com  Fact Sheet for Healthcare Providers:  SeriousBroker.it  This test is no t yet approved or cleared by the [place] FDA and  has been authorized  for detection and/or diagnosis of SARS-CoV-2 by FDA under an Emergency Use Authorization (EUA). This EUA will remain  in effect (meaning this test can be used) for the duration of the COVID-19 declaration under Section 564(b)(1) of the Act, 21 U.S.C.section 360bbb-3(b)(1), unless the authorization is terminated  or revoked sooner.       Influenza A by PCR NEGATIVE NEGATIVE Final   Influenza B by PCR NEGATIVE NEGATIVE Final    Comment: (NOTE) The Xpert Xpress SARS-CoV-2/FLU/RSV plus assay is intended as an aid in the diagnosis of influenza from Nasopharyngeal swab specimens and should not be used as a sole basis for treatment. Nasal washings and aspirates are unacceptable for Xpert Xpress SARS-CoV-2/FLU/RSV testing.  Fact Sheet for Patients: BloggerCourse.com  Fact Sheet for Healthcare Providers: SeriousBroker.it  This test is not yet approved or cleared by the [place] FDA and has been authorized for detection and/or diagnosis of SARS-CoV-2 by FDA under an Emergency Use Authorization (EUA). This EUA will remain in effect (meaning this test can be used) for the duration of the COVID-19 declaration under Section 564(b)(1) of the Act, 21 U.S.C. section 360bbb-3(b)(1), unless the authorization is terminated or revoked.     Resp Syncytial Virus by PCR NEGATIVE NEGATIVE Final    Comment: (NOTE) Fact Sheet for Patients: BloggerCourse.com  Fact Sheet for Healthcare Providers: SeriousBroker.it  This test is not yet approved or cleared by the [place] FDA and has been authorized for detection and/or diagnosis of SARS-CoV-2 by FDA under an Emergency Use Authorization (EUA). This EUA will remain in effect (meaning this test can be used) for the duration of the COVID-19 declaration under Section 564(b)(1) of the Act, 21 U.S.C. section 360bbb-3(b)(1), unless the authorization is  terminated or revoked.  Performed at [facility], [address] [place], [place] [address]      Labs: BNP (last 3 results) Recent Labs    09/10/23 1606 12/03/23 0802 12/19/23 0841  BNP 647.1* 1,056.2* 597.2*   Basic Metabolic Panel: Recent Labs  Lab 12/19/23 0841 12/20/23 0511 12/21/23 0423 12/22/23 0332  NA 135 132* 130* 128*  K 3.2* 3.9 4.1 4.1  CL 96* 94* 94* 93*  CO2 23 25 23  20*  GLUCOSE 143* 126* 109* 94  BUN 79* 81* 90* 92*  CREATININE 2.66* 2.91* 2.88* 2.80*  CALCIUM 8.3* 8.6* 8.7* 8.7*   Liver Function Tests: Recent Labs  Lab 12/19/23 0841 12/20/23 0511 12/21/23 0423 12/22/23 0332  AST 47* 40 30 32  ALT 18 17 15 16   ALKPHOS 150* 138* 105 113  BILITOT 1.9* 2.1* 1.9* 1.9*  PROT 6.9 6.6 6.3* 6.4*  ALBUMIN 2.5* 2.8* 3.3* 3.1*   No results for input(s): "LIPASE", "AMYLASE" in the last 168 hours. Recent Labs  Lab 12/22/23 0332  AMMONIA 38*   CBC: Recent Labs  Lab  12/19/23 0841 12/20/23 0511 12/21/23 0423 12/22/23 0332  WBC 6.7 5.2 10.4 9.4  HGB 12.1* 12.1* 11.4* 12.4*  HCT 40.6 39.7 36.0* 39.9  MCV 96.9 96.4 92.8 95.5  PLT 111* 79* 87* 101*   Cardiac Enzymes: No results for input(s): "CKTOTAL", "CKMB", "CKMBINDEX", "TROPONINI" in the last 168 hours. BNP: Invalid input(s): "POCBNP" CBG: Recent Labs  Lab 12/21/23 0822 12/21/23 1115 12/21/23 1714 12/21/23 2044 12/22/23 0824  GLUCAP 114* 128* 135* 139* 90   D-Dimer No results for input(s): "DDIMER" in the last 72 hours. Hgb A1c No results for input(s): "HGBA1C" in the last 72 hours. Lipid Profile No results for input(s): "CHOL", "HDL", "LDLCALC", "TRIG", "CHOLHDL", "LDLDIRECT" in the last 72 hours. Thyroid function studies No results for input(s): "TSH", "T4TOTAL", "T3FREE", "THYROIDAB" in the last 72 hours.  Invalid input(s): "FREET3" Anemia work up No results for input(s): "VITAMINB12", "FOLATE", "FERRITIN", "TIBC", "IRON", "RETICCTPCT" in the last 72  hours. Urinalysis    Component Value Date/Time   COLORURINE YELLOW (A) 12/03/2023 1200   APPEARANCEUR CLEAR (A) 12/03/2023 1200   LABSPEC 1.009 12/03/2023 1200   PHURINE 5.0 12/03/2023 1200   GLUCOSEU 150 (A) 12/03/2023 1200   HGBUR NEGATIVE 12/03/2023 1200   BILIRUBINUR NEGATIVE 12/03/2023 1200   BILIRUBINUR negative 03/31/2018 1452   KETONESUR NEGATIVE 12/03/2023 1200   PROTEINUR NEGATIVE 12/03/2023 1200   UROBILINOGEN 0.2 03/31/2018 1452   NITRITE NEGATIVE 12/03/2023 1200   LEUKOCYTESUR NEGATIVE 12/03/2023 1200   Sepsis Labs Recent Labs  Lab 12/19/23 0841 12/20/23 0511 12/21/23 0423 12/22/23 0332  WBC 6.7 5.2 10.4 9.4   Microbiology Recent Results (from the past 240 hours)  Resp panel by RT-PCR (RSV, Flu A&B, Covid) Anterior Nasal Swab     Status: None   Collection Time: 12/19/23  8:54 AM   Specimen: Anterior Nasal Swab  Result Value Ref Range Status   SARS Coronavirus 2 by RT PCR NEGATIVE NEGATIVE Final    Comment: (NOTE) SARS-CoV-2 target nucleic acids are NOT DETECTED.  The SARS-CoV-2 RNA is generally detectable in upper respiratory specimens during the acute phase of infection. The lowest concentration of SARS-CoV-2 viral copies this assay can detect is 138 copies/mL. A negative result does not preclude SARS-Cov-2 infection and should not be used as the sole basis for treatment or other patient management decisions. A negative result may occur with  improper specimen collection/handling, submission of specimen other than nasopharyngeal swab, presence of viral mutation(s) within the areas targeted by this assay, and inadequate number of viral copies(<138 copies/mL). A negative result must be combined with clinical observations, patient history, and epidemiological information. The expected result is Negative.  Fact Sheet for Patients:  BloggerCourse.com  Fact Sheet for Healthcare Providers:   SeriousBroker.it  This test is no t yet approved or cleared by the [place] FDA and  has been authorized for detection and/or diagnosis of SARS-CoV-2 by FDA under an Emergency Use Authorization (EUA). This EUA will remain  in effect (meaning this test can be used) for the duration of the COVID-19 declaration under Section 564(b)(1) of the Act, 21 U.S.C.section 360bbb-3(b)(1), unless the authorization is terminated  or revoked sooner.       Influenza A by PCR NEGATIVE NEGATIVE Final   Influenza B by PCR NEGATIVE NEGATIVE Final    Comment: (NOTE) The Xpert Xpress SARS-CoV-2/FLU/RSV plus assay is intended as an aid in the diagnosis of influenza from Nasopharyngeal swab specimens and should not be used as a sole basis for treatment. Nasal washings  and aspirates are unacceptable for Xpert Xpress SARS-CoV-2/FLU/RSV testing.  Fact Sheet for Patients: BloggerCourse.com  Fact Sheet for Healthcare Providers: SeriousBroker.it  This test is not yet approved or cleared by the [place] FDA and has been authorized for detection and/or diagnosis of SARS-CoV-2 by FDA under an Emergency Use Authorization (EUA). This EUA will remain in effect (meaning this test can be used) for the duration of the COVID-19 declaration under Section 564(b)(1) of the Act, 21 U.S.C. section 360bbb-3(b)(1), unless the authorization is terminated or revoked.     Resp Syncytial Virus by PCR NEGATIVE NEGATIVE Final    Comment: (NOTE) Fact Sheet for Patients: BloggerCourse.com  Fact Sheet for Healthcare Providers: SeriousBroker.it  This test is not yet approved or cleared by the [place] FDA and has been authorized for detection and/or diagnosis of SARS-CoV-2 by FDA under an Emergency Use Authorization (EUA). This EUA will remain in effect (meaning this test can be used) for  the duration of the COVID-19 declaration under Section 564(b)(1) of the Act, 21 U.S.C. section 360bbb-3(b)(1), unless the authorization is terminated or revoked.  Performed at [facility], [address] [place], [place] [address]      Time coordinating discharge: Over 30 minutes  SIGNED:   Charise Killian, MD  [facility] 12/22/2023, 10:20 AM Pager   If 7PM-7AM, please contact night-coverage [email or web address]

## 2023-12-22 NOTE — Progress Notes (Signed)
[facility]  ROUNDING NOTE   Subjective:   Taylor [name] is a 83 y.o. with medical problems of sCHF with EF < 20%, CKD-4, HTN, HLD, DM, COPD on 2L O2, CAD with stent and hx of CABG, stroke, A fib on Eliquis, s/p of AICD, melanoma, psoriatic arthritis, pancreatitis, liver cirrhosis. Patient presents to ED with increased lower extremity edema and has been admitted for CHF (congestive heart failure) (HCC) [I50.9] SOB (shortness of breath) [R06.02] Ascites of liver [R18.8] Acute on chronic congestive heart failure, unspecified heart failure type [facility]) [I50.9]  Patient is known to our practice and is followed by Dr Suezanne Jacquet.   Patient seen laying in bed  No family present Partially completed breakfast tray at bedside Room air  Creatinine 2.80  Objective:  Vital signs in last 24 hours:  Temp:  [96.9 F (36.1 C)-98.4 F (36.9 C)] 97.5 F (36.4 C) (01/27 0830) Pulse Rate:  [73-78] 73 (01/27 0830) Resp:  [11] 11 (01/27 0428) BP: (119-131)/(83-97) 120/92 (01/27 0830) SpO2:  [96 %-100 %] 99 % (01/27 0830) Weight:  [75.9 kg] 75.9 kg (01/27 0530)  Weight change: -0.544 kg Filed Weights   12/20/23 1635 12/21/23 0703 12/22/23 0530  Weight: 71.9 kg 71.4 kg 75.9 kg    Intake/Output: I/O last 3 completed shifts: In: 240 [P.O.:240] Out: 350 [Urine:350]   Intake/Output this shift:  Total I/O In: 240 [P.O.:240] Out: -   Physical Exam: General: NAD,   Head: Normocephalic, atraumatic. Moist oral mucosal membranes  Eyes: Anicteric  Lungs:  Clear to auscultation, room air  Heart: Regular rate and rhythm  Abdomen:  Firm, nontender, distention  Extremities: Trace peripheral edema.  Neurologic: Alert, moving all four extremities  Skin: No lesions       Basic Metabolic Panel: Recent Labs  Lab 12/19/23 0841 12/20/23 0511 12/21/23 0423 12/22/23 0332  NA 135 132* 130* 128*  K 3.2* 3.9 4.1 4.1  CL 96* 94* 94* 93*  CO2 23 25 23  20*  GLUCOSE 143* 126* 109* 94  BUN  79* 81* 90* 92*  CREATININE 2.66* 2.91* 2.88* 2.80*  CALCIUM 8.3* 8.6* 8.7* 8.7*    Liver Function Tests: Recent Labs  Lab 12/19/23 0841 12/20/23 0511 12/21/23 0423 12/22/23 0332  AST 47* 40 30 32  ALT 18 17 15 16   ALKPHOS 150* 138* 105 113  BILITOT 1.9* 2.1* 1.9* 1.9*  PROT 6.9 6.6 6.3* 6.4*  ALBUMIN 2.5* 2.8* 3.3* 3.1*   No results for input(s): "LIPASE", "AMYLASE" in the last 168 hours. Recent Labs  Lab 12/22/23 0332  AMMONIA 38*    CBC: Recent Labs  Lab 12/19/23 0841 12/20/23 0511 12/21/23 0423 12/22/23 0332  WBC 6.7 5.2 10.4 9.4  HGB 12.1* 12.1* 11.4* 12.4*  HCT 40.6 39.7 36.0* 39.9  MCV 96.9 96.4 92.8 95.5  PLT 111* 79* 87* 101*    Cardiac Enzymes: No results for input(s): "CKTOTAL", "CKMB", "CKMBINDEX", "TROPONINI" in the last 168 hours.  BNP: Invalid input(s): "POCBNP"  CBG: Recent Labs  Lab 12/21/23 0822 12/21/23 1115 12/21/23 1714 12/21/23 2044 12/22/23 0824  GLUCAP 114* 128* 135* 139* 90    Microbiology: Results for orders placed or performed during the hospital encounter of 12/19/23  Resp panel by RT-PCR (RSV, Flu A&B, Covid) Anterior Nasal Swab     Status: None   Collection Time: 12/19/23  8:54 AM   Specimen: Anterior Nasal Swab  Result Value Ref Range Status   SARS Coronavirus 2 by RT PCR NEGATIVE NEGATIVE Final  Comment: (NOTE) SARS-CoV-2 target nucleic acids are NOT DETECTED.  The SARS-CoV-2 RNA is generally detectable in upper respiratory specimens during the acute phase of infection. The lowest concentration of SARS-CoV-2 viral copies this assay can detect is 138 copies/mL. A negative result does not preclude SARS-Cov-2 infection and should not be used as the sole basis for treatment or other patient management decisions. A negative result may occur with  improper specimen collection/handling, submission of specimen other than nasopharyngeal swab, presence of viral mutation(s) within the areas targeted by this assay, and  inadequate number of viral copies(<138 copies/mL). A negative result must be combined with clinical observations, patient history, and epidemiological information. The expected result is Negative.  Fact Sheet for Patients:  BloggerCourse.com  Fact Sheet for Healthcare Providers:  SeriousBroker.it  This test is no t yet approved or cleared by the [place] FDA and  has been authorized for detection and/or diagnosis of SARS-CoV-2 by FDA under an Emergency Use Authorization (EUA). This EUA will remain  in effect (meaning this test can be used) for the duration of the COVID-19 declaration under Section 564(b)(1) of the Act, 21 U.S.C.section 360bbb-3(b)(1), unless the authorization is terminated  or revoked sooner.       Influenza A by PCR NEGATIVE NEGATIVE Final   Influenza B by PCR NEGATIVE NEGATIVE Final    Comment: (NOTE) The Xpert Xpress SARS-CoV-2/FLU/RSV plus assay is intended as an aid in the diagnosis of influenza from Nasopharyngeal swab specimens and should not be used as a sole basis for treatment. Nasal washings and aspirates are unacceptable for Xpert Xpress SARS-CoV-2/FLU/RSV testing.  Fact Sheet for Patients: BloggerCourse.com  Fact Sheet for Healthcare Providers: SeriousBroker.it  This test is not yet approved or cleared by the [place] FDA and has been authorized for detection and/or diagnosis of SARS-CoV-2 by FDA under an Emergency Use Authorization (EUA). This EUA will remain in effect (meaning this test can be used) for the duration of the COVID-19 declaration under Section 564(b)(1) of the Act, 21 U.S.C. section 360bbb-3(b)(1), unless the authorization is terminated or revoked.     Resp Syncytial Virus by PCR NEGATIVE NEGATIVE Final    Comment: (NOTE) Fact Sheet for Patients: BloggerCourse.com  Fact Sheet for Healthcare  Providers: SeriousBroker.it  This test is not yet approved or cleared by the [place] FDA and has been authorized for detection and/or diagnosis of SARS-CoV-2 by FDA under an Emergency Use Authorization (EUA). This EUA will remain in effect (meaning this test can be used) for the duration of the COVID-19 declaration under Section 564(b)(1) of the Act, 21 U.S.C. section 360bbb-3(b)(1), unless the authorization is terminated or revoked.  Performed at [facility], [address] [place], [place] [address]     Coagulation Studies: Recent Labs    12/20/23 0511  LABPROT 18.3*  INR 1.5*    Urinalysis: No results for input(s): "COLORURINE", "LABSPEC", "PHURINE", "GLUCOSEU", "HGBUR", "BILIRUBINUR", "KETONESUR", "PROTEINUR", "UROBILINOGEN", "NITRITE", "LEUKOCYTESUR" in the last 72 hours.  Invalid input(s): "APPERANCEUR"    Imaging: No results found.    Medications:      atorvastatin  40 mg Oral Daily   cholestyramine  4 g Oral BID   fluticasone  2 spray Each Nare Daily   furosemide  40 mg Oral BID   insulin aspart  0-9 Units Subcutaneous TID WC   lactulose  20 g Oral BID   magnesium oxide  200 mg Oral BID   pantoprazole  40 mg Oral Daily   potassium chloride  SA  10 mEq Oral BID   sodium chloride flush  3 mL Intravenous Q12H   acetaminophen, albuterol, clobetasol ointment, dextromethorphan, diphenhydrAMINE, guaiFENesin, ipratropium-albuterol, morphine injection, ondansetron (ZOFRAN) IV, oxyCODONE-acetaminophen, sodium chloride flush, triamcinolone ointment  Assessment/ Plan:  Taylor [name] is a 83 y.o.  male with medical problems of sCHF with EF < 20%, CKD-4, HTN, HLD, DM, COPD on 2L O2, CAD with stent and hx of CABG, stroke, A fib on Eliquis, s/p of AICD, melanoma, psoriatic arthritis, pancreatitis, liver cirrhosis. Patient presents to ED with increased lower extremity edema and has been admitted for CHF (congestive heart  failure) (HCC) [I50.9] SOB (shortness of breath) [R06.02] Ascites of liver [R18.8] Acute on chronic congestive heart failure, unspecified heart failure type (HCC) [I50.9]   Cronic kidney disease stage IIIb with baseline creatinine 2.2 and GFR of 30 on 09/30/23. Renal ultrasound pending. Patient appears hypervolemic on exam.  Creatinine remained stable, 2.8.  BUN remains 92.  Continue oral furosemide as ordered.  2. Acute respiratory failure, likely secondary to CHF decompensation. Volume overloaded. Baseline oxygen, 2.5L.  Currently on room air.  Paracentesis on 12/15/2023 with 6.7L removed. .   3. Anemia of chronic kidney disease Lab Results  Component Value Date   HGB 12.4 (L) 12/22/2023    Hemoglobin at goal   4. Hypertension with chronic kidney disease. Home regimen includes isosorbide and torsemide. Blood pressure stable for this patient.  Patient and family have decided to discharge with home hospice.  We will sign off at this time.    LOS: 3 Hadlyn Amero 1/27/202511:36 AM

## 2023-12-23 ENCOUNTER — Other Ambulatory Visit: Payer: Self-pay

## 2023-12-24 DIAGNOSIS — Z743 Need for continuous supervision: Secondary | ICD-10-CM | POA: Diagnosis not present

## 2023-12-24 DIAGNOSIS — R279 Unspecified lack of coordination: Secondary | ICD-10-CM | POA: Diagnosis not present

## 2023-12-27 DEATH — deceased

## 2024-05-05 ENCOUNTER — Ambulatory Visit: Payer: Medicare Other | Admitting: Dermatology
